# Patient Record
Sex: Male | Born: 1945
Health system: Southern US, Community
[De-identification: ages and names within clinical notes are randomized; demographics above are authoritative.]

## PROBLEM LIST (undated history)

## (undated) ENCOUNTER — Emergency Department (HOSPITAL_COMMUNITY): Admission: EM | Payer: Medicare HMO | Source: Home / Self Care

## (undated) DIAGNOSIS — N189 Chronic kidney disease, unspecified: Secondary | ICD-10-CM

## (undated) DIAGNOSIS — Z87442 Personal history of urinary calculi: Secondary | ICD-10-CM

## (undated) DIAGNOSIS — I472 Ventricular tachycardia: Secondary | ICD-10-CM

## (undated) DIAGNOSIS — E119 Type 2 diabetes mellitus without complications: Secondary | ICD-10-CM

## (undated) DIAGNOSIS — I10 Essential (primary) hypertension: Secondary | ICD-10-CM

## (undated) DIAGNOSIS — I2510 Atherosclerotic heart disease of native coronary artery without angina pectoris: Secondary | ICD-10-CM

## (undated) DIAGNOSIS — Z9289 Personal history of other medical treatment: Secondary | ICD-10-CM

## (undated) DIAGNOSIS — Z95810 Presence of automatic (implantable) cardiac defibrillator: Secondary | ICD-10-CM

## (undated) DIAGNOSIS — G4730 Sleep apnea, unspecified: Secondary | ICD-10-CM

## (undated) DIAGNOSIS — I639 Cerebral infarction, unspecified: Secondary | ICD-10-CM

## (undated) DIAGNOSIS — I4729 Other ventricular tachycardia: Secondary | ICD-10-CM

## (undated) DIAGNOSIS — I219 Acute myocardial infarction, unspecified: Secondary | ICD-10-CM

## (undated) DIAGNOSIS — I5042 Chronic combined systolic (congestive) and diastolic (congestive) heart failure: Secondary | ICD-10-CM

## (undated) DIAGNOSIS — I255 Ischemic cardiomyopathy: Secondary | ICD-10-CM

## (undated) DIAGNOSIS — G839 Paralytic syndrome, unspecified: Secondary | ICD-10-CM

## (undated) DIAGNOSIS — G629 Polyneuropathy, unspecified: Secondary | ICD-10-CM

## (undated) DIAGNOSIS — I509 Heart failure, unspecified: Secondary | ICD-10-CM

## (undated) HISTORY — DX: Ischemic cardiomyopathy: I25.5

## (undated) HISTORY — DX: Personal history of other medical treatment: Z92.89

## (undated) HISTORY — DX: Chronic kidney disease, unspecified: N18.9

## (undated) HISTORY — DX: Ventricular tachycardia: I47.2

## (undated) HISTORY — DX: Atherosclerotic heart disease of native coronary artery without angina pectoris: I25.10

## (undated) HISTORY — PX: CARDIAC DEFIBRILLATOR PLACEMENT: SHX171

## (undated) HISTORY — DX: Chronic combined systolic (congestive) and diastolic (congestive) heart failure: I50.42

## (undated) HISTORY — DX: Other ventricular tachycardia: I47.29

---

## 1968-12-22 HISTORY — PX: LACERATION REPAIR: SHX5168

## 1978-12-23 HISTORY — PX: LACERATION REPAIR: SHX5168

## 1978-12-23 HISTORY — PX: KNEE ARTHROSCOPY: SHX127

## 2005-04-23 DIAGNOSIS — I219 Acute myocardial infarction, unspecified: Secondary | ICD-10-CM

## 2005-04-23 HISTORY — DX: Acute myocardial infarction, unspecified: I21.9

## 2005-04-23 HISTORY — PX: CARDIAC CATHETERIZATION: SHX172

## 2005-08-31 ENCOUNTER — Ambulatory Visit: Payer: Self-pay | Admitting: Cardiology

## 2005-08-31 ENCOUNTER — Encounter: Payer: Self-pay | Admitting: Cardiology

## 2005-08-31 ENCOUNTER — Inpatient Hospital Stay (HOSPITAL_COMMUNITY): Admission: EM | Admit: 2005-08-31 | Discharge: 2005-09-08 | Payer: Self-pay | Admitting: Emergency Medicine

## 2005-09-05 ENCOUNTER — Encounter: Payer: Self-pay | Admitting: Cardiology

## 2005-09-10 ENCOUNTER — Ambulatory Visit: Payer: Self-pay | Admitting: Internal Medicine

## 2005-09-18 ENCOUNTER — Ambulatory Visit: Payer: Self-pay | Admitting: Cardiology

## 2005-09-20 ENCOUNTER — Ambulatory Visit: Payer: Self-pay | Admitting: Cardiology

## 2005-09-24 ENCOUNTER — Ambulatory Visit: Payer: Self-pay | Admitting: Internal Medicine

## 2005-09-26 ENCOUNTER — Ambulatory Visit: Payer: Self-pay

## 2005-09-27 ENCOUNTER — Ambulatory Visit: Payer: Self-pay | Admitting: Internal Medicine

## 2005-10-11 ENCOUNTER — Ambulatory Visit: Payer: Self-pay | Admitting: Internal Medicine

## 2005-10-15 ENCOUNTER — Encounter: Payer: Self-pay | Admitting: Cardiology

## 2005-10-15 ENCOUNTER — Ambulatory Visit: Payer: Self-pay

## 2005-10-19 ENCOUNTER — Ambulatory Visit: Payer: Self-pay | Admitting: Internal Medicine

## 2005-10-22 ENCOUNTER — Ambulatory Visit: Payer: Self-pay | Admitting: Cardiology

## 2005-11-08 ENCOUNTER — Ambulatory Visit: Payer: Self-pay | Admitting: Cardiology

## 2005-11-16 ENCOUNTER — Ambulatory Visit: Payer: Self-pay | Admitting: Internal Medicine

## 2005-12-04 ENCOUNTER — Ambulatory Visit: Payer: Self-pay | Admitting: Internal Medicine

## 2005-12-06 ENCOUNTER — Ambulatory Visit (HOSPITAL_BASED_OUTPATIENT_CLINIC_OR_DEPARTMENT_OTHER): Admission: RE | Admit: 2005-12-06 | Discharge: 2005-12-06 | Payer: Self-pay | Admitting: Internal Medicine

## 2005-12-12 ENCOUNTER — Ambulatory Visit: Payer: Self-pay | Admitting: Pulmonary Disease

## 2005-12-21 ENCOUNTER — Ambulatory Visit: Payer: Self-pay | Admitting: Cardiology

## 2006-01-16 ENCOUNTER — Ambulatory Visit: Payer: Self-pay | Admitting: Emergency Medicine

## 2006-01-18 ENCOUNTER — Ambulatory Visit: Payer: Self-pay | Admitting: Internal Medicine

## 2006-01-21 ENCOUNTER — Ambulatory Visit: Payer: Self-pay | Admitting: Internal Medicine

## 2006-01-22 ENCOUNTER — Ambulatory Visit: Payer: Self-pay | Admitting: Internal Medicine

## 2006-01-25 ENCOUNTER — Ambulatory Visit: Payer: Self-pay | Admitting: Internal Medicine

## 2006-02-21 ENCOUNTER — Ambulatory Visit: Payer: Self-pay | Admitting: Internal Medicine

## 2006-03-04 ENCOUNTER — Ambulatory Visit: Payer: Self-pay | Admitting: Emergency Medicine

## 2006-03-04 ENCOUNTER — Ambulatory Visit: Payer: Self-pay | Admitting: Internal Medicine

## 2006-03-13 ENCOUNTER — Ambulatory Visit: Payer: Self-pay | Admitting: Cardiology

## 2006-05-03 ENCOUNTER — Ambulatory Visit: Payer: Self-pay | Admitting: Internal Medicine

## 2006-05-09 ENCOUNTER — Ambulatory Visit: Payer: Self-pay

## 2006-05-09 ENCOUNTER — Encounter: Payer: Self-pay | Admitting: Cardiology

## 2006-05-20 ENCOUNTER — Ambulatory Visit: Payer: Self-pay | Admitting: Internal Medicine

## 2006-06-03 ENCOUNTER — Ambulatory Visit: Payer: Self-pay | Admitting: Internal Medicine

## 2006-06-21 ENCOUNTER — Ambulatory Visit: Payer: Self-pay | Admitting: Internal Medicine

## 2006-06-21 LAB — CONVERTED CEMR LAB
BUN: 19 mg/dL (ref 6–23)
CO2: 29 meq/L (ref 19–32)
Calcium: 8.8 mg/dL (ref 8.4–10.5)
Cholesterol: 128 mg/dL (ref 0–200)
Creatinine,U: 26.4 mg/dL
GFR calc Af Amer: 80 mL/min
GFR calc non Af Amer: 66 mL/min
Glucose, Bld: 214 mg/dL — ABNORMAL HIGH (ref 70–99)
HDL: 32.7 mg/dL — ABNORMAL LOW (ref >39.0)
LDL Cholesterol: 56 mg/dL (ref 0–99)
Microalb Creat Ratio: 7.6 mg/g (ref 0.0–30.0)
Microalb, Ur: 0.2 mg/dL (ref 0.0–1.9)
Potassium: 3.8 meq/L (ref 3.5–5.1)
Pro B Natriuretic peptide (BNP): 62 pg/mL (ref 0.0–100.0)
Sodium: 138 meq/L (ref 135–145)
Total CHOL/HDL Ratio: 3.9
Triglycerides: 199 mg/dL — ABNORMAL HIGH (ref 0–149)

## 2006-07-11 ENCOUNTER — Ambulatory Visit: Payer: Self-pay | Admitting: Cardiology

## 2006-07-16 ENCOUNTER — Ambulatory Visit: Payer: Self-pay | Admitting: Internal Medicine

## 2006-08-05 ENCOUNTER — Ambulatory Visit: Payer: Self-pay | Admitting: Internal Medicine

## 2006-09-02 ENCOUNTER — Ambulatory Visit: Payer: Self-pay | Admitting: Cardiology

## 2006-09-02 ENCOUNTER — Ambulatory Visit: Payer: Self-pay | Admitting: Internal Medicine

## 2006-09-02 LAB — CONVERTED CEMR LAB
CO2: 29 meq/L (ref 19–32)
Calcium: 8.9 mg/dL (ref 8.4–10.5)

## 2006-09-12 ENCOUNTER — Ambulatory Visit: Payer: Self-pay | Admitting: Internal Medicine

## 2006-09-30 ENCOUNTER — Ambulatory Visit: Payer: Self-pay | Admitting: Internal Medicine

## 2006-11-19 ENCOUNTER — Ambulatory Visit: Payer: Self-pay | Admitting: Internal Medicine

## 2006-11-27 ENCOUNTER — Ambulatory Visit: Payer: Self-pay | Admitting: Internal Medicine

## 2006-11-27 LAB — CONVERTED CEMR LAB
Creatinine, Ser: 1.1 mg/dL (ref 0.4–1.5)
Creatinine,U: 140.1 mg/dL
GFR calc Af Amer: 88 mL/min
GFR calc non Af Amer: 73 mL/min
Glucose, Bld: 166 mg/dL — ABNORMAL HIGH (ref 70–99)
Hgb A1c MFr Bld: 7.7 % — ABNORMAL HIGH (ref 4.6–6.0)
Microalb Creat Ratio: 5 mg/g (ref 0.0–30.0)
Microalb, Ur: 0.7 mg/dL (ref 0.0–1.9)
Potassium: 3.7 meq/L (ref 3.5–5.1)
Sodium: 141 meq/L (ref 135–145)
Total CHOL/HDL Ratio: 3.5
VLDL: 24 mg/dL (ref 0–40)

## 2006-12-02 ENCOUNTER — Ambulatory Visit: Payer: Self-pay | Admitting: Internal Medicine

## 2006-12-10 ENCOUNTER — Ambulatory Visit: Payer: Self-pay | Admitting: Internal Medicine

## 2006-12-12 ENCOUNTER — Ambulatory Visit: Payer: Self-pay | Admitting: Internal Medicine

## 2007-01-20 ENCOUNTER — Ambulatory Visit: Payer: Self-pay | Admitting: Cardiology

## 2007-02-07 ENCOUNTER — Encounter: Payer: Self-pay | Admitting: Cardiology

## 2007-02-07 ENCOUNTER — Ambulatory Visit: Payer: Self-pay

## 2007-02-11 ENCOUNTER — Ambulatory Visit: Payer: Self-pay | Admitting: Pediatrics

## 2007-02-27 ENCOUNTER — Encounter: Payer: Self-pay | Admitting: Internal Medicine

## 2007-02-27 DIAGNOSIS — Z87442 Personal history of urinary calculi: Secondary | ICD-10-CM | POA: Insufficient documentation

## 2007-02-27 DIAGNOSIS — I130 Hypertensive heart and chronic kidney disease with heart failure and stage 1 through stage 4 chronic kidney disease, or unspecified chronic kidney disease: Secondary | ICD-10-CM

## 2007-02-27 DIAGNOSIS — G4733 Obstructive sleep apnea (adult) (pediatric): Secondary | ICD-10-CM

## 2007-02-27 DIAGNOSIS — I252 Old myocardial infarction: Secondary | ICD-10-CM

## 2007-03-03 ENCOUNTER — Ambulatory Visit: Payer: Self-pay | Admitting: Internal Medicine

## 2007-04-11 ENCOUNTER — Ambulatory Visit: Payer: Self-pay | Admitting: Internal Medicine

## 2007-04-11 DIAGNOSIS — I255 Ischemic cardiomyopathy: Secondary | ICD-10-CM

## 2007-04-11 DIAGNOSIS — E782 Mixed hyperlipidemia: Secondary | ICD-10-CM | POA: Insufficient documentation

## 2007-04-22 ENCOUNTER — Ambulatory Visit: Payer: Self-pay | Admitting: Cardiology

## 2007-04-22 LAB — CONVERTED CEMR LAB
BUN: 10 mg/dL (ref 6–23)
CO2: 30 meq/L (ref 19–32)
Calcium: 8.4 mg/dL (ref 8.4–10.5)
Creatinine, Ser: 1.1 mg/dL (ref 0.4–1.5)
GFR calc Af Amer: 88 mL/min
GFR calc non Af Amer: 73 mL/min

## 2007-06-13 ENCOUNTER — Ambulatory Visit: Payer: Self-pay | Admitting: Cardiology

## 2007-08-08 ENCOUNTER — Ambulatory Visit: Payer: Self-pay | Admitting: Internal Medicine

## 2007-08-09 LAB — CONVERTED CEMR LAB
ALT: 25 units/L (ref 0–53)
AST: 22 units/L (ref 0–37)
Albumin: 3.5 g/dL (ref 3.5–5.2)
BUN: 13 mg/dL (ref 6–23)
CO2: 30 meq/L (ref 19–32)
Calcium: 8.8 mg/dL (ref 8.4–10.5)
Chloride: 105 meq/L (ref 96–112)
Direct LDL: 51.7 mg/dL
GFR calc Af Amer: 98 mL/min
Potassium: 3.3 meq/L — ABNORMAL LOW (ref 3.5–5.1)
Total Protein: 6.5 g/dL (ref 6.0–8.3)
Triglycerides: 263 mg/dL (ref 0–149)
VLDL: 53 mg/dL — ABNORMAL HIGH (ref 0–40)

## 2007-08-12 ENCOUNTER — Ambulatory Visit: Payer: Self-pay | Admitting: Internal Medicine

## 2007-08-12 DIAGNOSIS — E1149 Type 2 diabetes mellitus with other diabetic neurological complication: Secondary | ICD-10-CM

## 2007-08-12 DIAGNOSIS — E1165 Type 2 diabetes mellitus with hyperglycemia: Secondary | ICD-10-CM

## 2007-09-23 ENCOUNTER — Ambulatory Visit: Payer: Self-pay | Admitting: Internal Medicine

## 2007-10-07 ENCOUNTER — Ambulatory Visit: Payer: Self-pay | Admitting: Internal Medicine

## 2007-10-07 DIAGNOSIS — M109 Gout, unspecified: Secondary | ICD-10-CM

## 2007-11-14 ENCOUNTER — Ambulatory Visit: Payer: Self-pay | Admitting: Internal Medicine

## 2007-11-14 LAB — CONVERTED CEMR LAB
CO2: 28 meq/L (ref 19–32)
Calcium: 8.8 mg/dL (ref 8.4–10.5)
Chloride: 104 meq/L (ref 96–112)
Creatinine, Ser: 1 mg/dL (ref 0.4–1.5)
GFR calc Af Amer: 98 mL/min
Hgb A1c MFr Bld: 8.1 % — ABNORMAL HIGH (ref 4.6–6.0)
Sodium: 140 meq/L (ref 135–145)

## 2007-11-18 ENCOUNTER — Ambulatory Visit: Payer: Self-pay | Admitting: Internal Medicine

## 2007-11-28 ENCOUNTER — Ambulatory Visit: Payer: Self-pay | Admitting: Cardiology

## 2008-01-19 ENCOUNTER — Ambulatory Visit: Payer: Self-pay | Admitting: Internal Medicine

## 2008-02-13 ENCOUNTER — Ambulatory Visit: Payer: Self-pay | Admitting: Internal Medicine

## 2008-02-13 LAB — CONVERTED CEMR LAB: Microalb, Ur: 1.1 mg/dL (ref 0.0–1.9)

## 2008-02-19 ENCOUNTER — Ambulatory Visit: Payer: Self-pay | Admitting: Internal Medicine

## 2008-04-19 ENCOUNTER — Ambulatory Visit: Payer: Self-pay | Admitting: Internal Medicine

## 2008-05-27 ENCOUNTER — Encounter: Payer: Self-pay | Admitting: Internal Medicine

## 2008-06-08 ENCOUNTER — Ambulatory Visit: Payer: Self-pay | Admitting: Cardiology

## 2008-06-11 ENCOUNTER — Ambulatory Visit: Payer: Self-pay | Admitting: Internal Medicine

## 2008-06-11 LAB — CONVERTED CEMR LAB
BUN: 21 mg/dL (ref 6–23)
CO2: 28 meq/L (ref 19–32)
Calcium: 8.9 mg/dL (ref 8.4–10.5)
Chloride: 109 meq/L (ref 96–112)
Creatinine, Ser: 1.2 mg/dL (ref 0.4–1.5)
Creatinine,U: 191.9 mg/dL
GFR calc non Af Amer: 65 mL/min
Glucose, Bld: 182 mg/dL — ABNORMAL HIGH (ref 70–99)
Sodium: 144 meq/L (ref 135–145)

## 2008-06-15 ENCOUNTER — Ambulatory Visit: Payer: Self-pay | Admitting: Internal Medicine

## 2008-07-02 ENCOUNTER — Ambulatory Visit: Payer: Self-pay | Admitting: Gastroenterology

## 2008-07-19 ENCOUNTER — Ambulatory Visit: Payer: Self-pay | Admitting: Internal Medicine

## 2008-08-16 ENCOUNTER — Telehealth (INDEPENDENT_AMBULATORY_CARE_PROVIDER_SITE_OTHER): Payer: Self-pay | Admitting: *Deleted

## 2008-08-20 ENCOUNTER — Ambulatory Visit: Payer: Self-pay | Admitting: Gastroenterology

## 2008-08-20 ENCOUNTER — Encounter: Payer: Self-pay | Admitting: Gastroenterology

## 2008-08-23 ENCOUNTER — Encounter: Payer: Self-pay | Admitting: Gastroenterology

## 2008-09-28 ENCOUNTER — Ambulatory Visit: Payer: Self-pay | Admitting: Internal Medicine

## 2008-10-07 ENCOUNTER — Ambulatory Visit: Payer: Self-pay | Admitting: Internal Medicine

## 2008-10-07 LAB — CONVERTED CEMR LAB
ALT: 21 units/L (ref 0–53)
AST: 21 units/L (ref 0–37)
Albumin: 3.7 g/dL (ref 3.5–5.2)
Bilirubin, Direct: 0.2 mg/dL (ref 0.0–0.3)
CO2: 28 meq/L (ref 19–32)
Chloride: 108 meq/L (ref 96–112)
GFR calc non Af Amer: 65.15 mL/min (ref >60)
HDL: 30.9 mg/dL — ABNORMAL LOW (ref >39.00)
Total Bilirubin: 0.9 mg/dL (ref 0.3–1.2)
Total Protein: 6.9 g/dL (ref 6.0–8.3)

## 2008-10-12 ENCOUNTER — Ambulatory Visit: Payer: Self-pay | Admitting: Internal Medicine

## 2008-10-12 DIAGNOSIS — IMO0001 Reserved for inherently not codable concepts without codable children: Secondary | ICD-10-CM

## 2008-10-12 LAB — CONVERTED CEMR LAB
Total CK: 147 units/L (ref 7–232)
Uric Acid, Serum: 6.8 mg/dL (ref 4.0–7.8)
Vit D, 1,25-Dihydroxy: 25 — ABNORMAL LOW (ref 30–89)

## 2008-10-14 ENCOUNTER — Encounter: Payer: Self-pay | Admitting: Internal Medicine

## 2008-12-27 ENCOUNTER — Ambulatory Visit: Payer: Self-pay | Admitting: Internal Medicine

## 2009-01-13 ENCOUNTER — Encounter: Payer: Self-pay | Admitting: Internal Medicine

## 2009-01-25 ENCOUNTER — Telehealth: Payer: Self-pay | Admitting: Internal Medicine

## 2009-01-25 ENCOUNTER — Ambulatory Visit: Payer: Self-pay | Admitting: Internal Medicine

## 2009-01-25 LAB — CONVERTED CEMR LAB
BUN: 18 mg/dL (ref 6–23)
Chloride: 106 meq/L (ref 96–112)
Creatinine, Ser: 1.22 mg/dL (ref 0.40–1.50)
Hgb A1c MFr Bld: 8.2 % — ABNORMAL HIGH (ref 4.6–6.1)
Potassium: 4.3 meq/L (ref 3.5–5.3)

## 2009-01-28 ENCOUNTER — Ambulatory Visit: Payer: Self-pay | Admitting: Internal Medicine

## 2009-03-10 ENCOUNTER — Encounter (INDEPENDENT_AMBULATORY_CARE_PROVIDER_SITE_OTHER): Payer: Self-pay | Admitting: *Deleted

## 2009-04-07 ENCOUNTER — Encounter: Payer: Self-pay | Admitting: Internal Medicine

## 2009-04-18 ENCOUNTER — Telehealth: Payer: Self-pay | Admitting: Internal Medicine

## 2009-04-18 ENCOUNTER — Encounter: Payer: Self-pay | Admitting: Internal Medicine

## 2009-04-26 ENCOUNTER — Ambulatory Visit: Payer: Self-pay | Admitting: Internal Medicine

## 2009-04-26 ENCOUNTER — Telehealth: Payer: Self-pay | Admitting: Internal Medicine

## 2009-05-05 ENCOUNTER — Encounter: Payer: Self-pay | Admitting: Internal Medicine

## 2009-05-23 ENCOUNTER — Ambulatory Visit: Payer: Self-pay | Admitting: Internal Medicine

## 2009-05-23 ENCOUNTER — Telehealth: Payer: Self-pay | Admitting: Internal Medicine

## 2009-05-23 ENCOUNTER — Encounter: Payer: Self-pay | Admitting: Internal Medicine

## 2009-05-23 LAB — CONVERTED CEMR LAB
Chloride: 109 meq/L (ref 96–112)
Creatinine, Ser: 1.2 mg/dL (ref 0.4–1.5)
GFR calc non Af Amer: 65.02 mL/min (ref >60)
Glucose, Bld: 213 mg/dL — ABNORMAL HIGH (ref 70–99)
Potassium: 4.1 meq/L (ref 3.5–5.1)
Sodium: 142 meq/L (ref 135–145)

## 2009-07-25 ENCOUNTER — Ambulatory Visit: Payer: Self-pay | Admitting: Internal Medicine

## 2009-08-10 ENCOUNTER — Encounter: Payer: Self-pay | Admitting: Internal Medicine

## 2009-10-11 ENCOUNTER — Ambulatory Visit: Payer: Self-pay | Admitting: Internal Medicine

## 2009-10-11 DIAGNOSIS — I5042 Chronic combined systolic (congestive) and diastolic (congestive) heart failure: Secondary | ICD-10-CM | POA: Insufficient documentation

## 2009-10-11 LAB — CONVERTED CEMR LAB
CO2: 23 meq/L (ref 19–32)
Chloride: 107 meq/L (ref 96–112)
Creatinine, Urine: 443.8 mg/dL
Glucose, Bld: 92 mg/dL (ref 70–99)
Microalb, Ur: 3.61 mg/dL — ABNORMAL HIGH (ref 0.00–1.89)
Sodium: 143 meq/L (ref 135–145)

## 2009-10-12 ENCOUNTER — Telehealth: Payer: Self-pay | Admitting: Internal Medicine

## 2009-12-02 ENCOUNTER — Ambulatory Visit: Payer: Self-pay | Admitting: Cardiology

## 2009-12-21 ENCOUNTER — Ambulatory Visit: Payer: Self-pay | Admitting: Cardiology

## 2009-12-21 ENCOUNTER — Ambulatory Visit: Payer: Self-pay | Admitting: Cardiovascular Disease

## 2009-12-21 ENCOUNTER — Encounter: Payer: Self-pay | Admitting: Cardiology

## 2009-12-21 ENCOUNTER — Ambulatory Visit (HOSPITAL_COMMUNITY): Admission: RE | Admit: 2009-12-21 | Discharge: 2009-12-21 | Payer: Self-pay | Admitting: Cardiology

## 2009-12-21 ENCOUNTER — Ambulatory Visit: Payer: Self-pay

## 2010-01-04 LAB — CONVERTED CEMR LAB
AST: 14 units/L (ref 0–37)
Alkaline Phosphatase: 125 units/L — ABNORMAL HIGH (ref 39–117)
Total Protein: 6.6 g/dL (ref 6.0–8.3)
Triglycerides: 153 mg/dL — ABNORMAL HIGH (ref 0.0–149.0)
VLDL: 30.6 mg/dL (ref 0.0–40.0)

## 2010-01-05 ENCOUNTER — Encounter: Payer: Self-pay | Admitting: Cardiology

## 2010-01-12 ENCOUNTER — Ambulatory Visit: Payer: Self-pay | Admitting: Internal Medicine

## 2010-02-01 ENCOUNTER — Encounter: Payer: Self-pay | Admitting: Internal Medicine

## 2010-04-20 ENCOUNTER — Ambulatory Visit: Payer: Self-pay | Admitting: Internal Medicine

## 2010-05-21 ENCOUNTER — Encounter (INDEPENDENT_AMBULATORY_CARE_PROVIDER_SITE_OTHER): Payer: Self-pay | Admitting: *Deleted

## 2010-05-23 NOTE — Assessment & Plan Note (Signed)
Summary: [name] [name]      Allergies Added: NKDA  Visit Type:  Follow-up Primary Provider:  Jennings Books DO  CC:  CAD.  History of Present Illness: The patient presents for followup of her recent cardiomyopathy. Since I last saw him he has had no new cardiovascular complaints. He is working full time. The most vigorous thing he does his works in his yard to include pushing a Therapist, art. With this he does not describe chest, neck or arm discomfort. He does not get palpitations, presyncope or syncope. He denies any shortness of breath, PND or orthopnea. He has had no weight gain or edema. Unfortunately I note that his hemoglobin A1c was 10.9. He is apparently being referred to an endocrinologist.  Current Medications (verified): 1)  Aspirin Ec 325 Mg Tbec (Aspirin) .... Take 1 Tablet By Mouth Once A Day 2)  Coreg 25 Mg Tabs (Carvedilol) .... Take 1 Tablet By Mouth Twice A Day 3)  Enalapril Maleate 10 Mg  Tabs (Enalapril Maleate) .... Two Times A Day 4)  Lantus Solostar 100 Unit/ml Soln (Insulin Glargine) .... Inject 35 Units To 50 Units Subcutaneously At Bedtime 5)  Lasix 40 Mg Tabs (Furosemide) .... Take 1 Tablet By Mouth Every Morning 6)  Lipitor 80 Mg Tabs (Atorvastatin Calcium) .... Take 1 Tablet By Mouth At Bedtime 7)  Metformin Hcl 1000 Mg  Tabs (Metformin Hcl) .... One Po Two Times A Day 8)  Protonix 40 Mg Tbec (Pantoprazole Sodium) .... Take 1 Tablet By Mouth Once A Day 9)  Novolog Flexpen 100 Unit/ml  Soln (Insulin Aspart) .... Inject 12 Units Subcutaneously Before Dinner  and Inject 12 Units Subcutaneously Before Lunch 10)  Freestyle Lancets   Misc (Lancets) .... Use One Lancet Three Times Daily 11)  Freestyle Test   Strp (Glucose Blood) .... Test Blood Sugar Three Times A Day 12)  Fish Oil Concentrate 1000 Mg  Caps (Omega-3 Fatty Acids) .... 2 Caps By Mouth Bid 13)  Bd U/f Short Pen Needle 31g X 8 Mm  Misc (Insulin Pen Needle) .... Use For Insulin Injections Three Times A Day 14)   Amlodipine Besylate 2.5 Mg Tabs (Amlodipine Besylate) .... One By Mouth Qd 15)  Vitamin D 2000 Unit Caps (Cholecalciferol) .... Take 1 Capsule By Mouth Once A Day 16)  Colcrys 0.6 Mg Tabs (Colchicine) .... One By Mouth Once Daily  Allergies (verified): No Known Drug Allergies  Past History:  Past Medical History: Reviewed history from 10/11/2009 and no changes required. Ischemic cardiomyopathy  Coronary artery disease (triple vessel) History of nonsustained ventricular tachycardia status post implantation of ICD Cardiomyopathy improved from 20 % to  45% Diabetes Mellitus Type II  Hypertension Obstructive sleep apnea         Dr. Creig Hines  Past Surgical History: Reviewed history from 05/23/2009 and no changes required. Left forearm signs  ICD - Medtronic Atlas cardioverter defibrillator/pacemaker (may 2007)    Review of Systems       As stated in the HPI and negative for all other systems.   Vital Signs:  Patient profile:   65 year old male Height:      73 inches Weight:      213 pounds BMI:     28.20 Pulse rate:   66 / minute Resp:     16 per minute BP sitting:   108 / 76  (right arm)  Vitals Entered By: Levora Angel, CNA (December 02, 2009 3:20 PM)  Physical Exam  General:  Well  developed, well nourished, in no acute distress. Head:  normocephalic and atraumatic Eyes:  PERRLA/EOM intact; conjunctiva and lids normal. Mouth:  Poor dentition. Oral mucosa normal. Neck:  Neck supple, no JVD. No masses, thyromegaly or abnormal cervical nodes. Lungs:  Clear bilaterally to auscultation and percussion. Abdomen:  Bowel sounds positive; abdomen soft and non-tender without masses, organomegaly, or hernias noted. No hepatosplenomegaly. Msk:  Back normal, normal gait. Muscle strength and tone normal. Extremities:  No clubbing or cyanosis. Neurologic:  Alert and oriented x 3. Skin:  Intact without lesions or rashes. Cervical Nodes:  no significant adenopathy Axillary Nodes:   no significant adenopathy Inguinal Nodes:  no significant adenopathy Psych:  Normal affect.   Detailed Cardiovascular Exam  Neck    Carotids: Carotids full and equal bilaterally without bruits.      Neck Veins: Normal, no JVD.    Heart    Inspection: no deformities or lifts noted.      Palpation: normal PMI with no thrills palpable.      Auscultation: regular rate and rhythm, S1, S2 soft apical systolic murmur, no diastolic murmurs  Vascular    Abdominal Aorta: no palpable masses, pulsations, or audible bruits.      Femoral Pulses: normal femoral pulses bilaterally.      Pedal Pulses: normal pedal pulses bilaterally.      Radial Pulses: normal radial pulses bilaterally.      Peripheral Circulation: no clubbing, cyanosis, or edema noted with normal capillary refill.      ICD Specifications Following MD:  Virl Axe, MD     ICD Vendor:  [facility]     ICD Model Number:  [id number]     ICD Serial Number:  [id number] ICD DOI:  09/07/2005     ICD Implanting MD:  Virl Axe, MD  Lead 1:    Location: RV     DOI: 09/07/2005     Model #: [id number]     Serial #: [id number]     Status: active  Indications::  ICM   ICD Follow Up ICD Dependent:  No      Brady Parameters Mode VVI     Lower Rate Limit:  40      Tachy Zones VF:  240     VT:  200     VT1:  171     Impression & Recommendations:  Problem # 1:  CAD (ICD-414.00) The patient has no new symptoms. He needs aggressive risk reduction. He doesn't need further testing at this point without new symptoms.  Problem # 2:  COMBINED HEART FAILURE, CHRONIC (ICD-428.42) He seems to be euvolemic. However, he has not had an ejection fraction assessed since 2008. I will order an echocardiogram. For now he will continue the meds as listed. Orders: Echocardiogram (Echo)  Problem # 3:  HYPERLIPIDEMIA (B2193296.2) He needs a fasting lipid profile. It has been over one year. The goal will be an LDL less than 70 and HDL greater than 40.  Other Orders: EKG  w/ Interpretation (93000)  Patient Instructions: 1)  Your physician recommends that you schedule a follow-up appointment in: 1 yr with Dr Percival Spanish 2)  Your physician recommends that you return for a FASTING lipid and profile: 272.00 v58.69 3)  Your physician recommends that you continue on your current medications as directed. Please refer to the Current Medication list given to you today. 4)  Your physician has requested that you have an echocardiogram.  Echocardiography is a painless test that uses sound  waves to create images of your heart. It provides your doctor with information about the size and shape of your heart and how well your heart's chambers and valves are working.  This procedure takes approximately one hour. There are no restrictions for this procedure.

## 2010-05-23 NOTE — Assessment & Plan Note (Signed)
Summary: 3 MONTH FOLLOW UP/MHF   Vital Signs:  Patient profile:   65 year old male Weight:      216.50 pounds BMI:     28.67 O2 Sat:      98 % on Room air Temp:     97.8 degrees F oral Pulse rate:   68 / minute Pulse rhythm:   regular Resp:     18 per minute BP sitting:   116 / 72  (left arm) Cuff size:   large  Vitals Entered By: Jiles Garter CMA (May 23, 2009 9:06 AM)  O2 Flow:  Room air  Primary Care Provider:  D. Drema Pry DO  CC:  Right foot pain.  History of Present Illness: Follow up disease management & right Foot Pain   65 y/o white male with hx of CAD, Htn, and gout reports acute right foot pain that started saturday night.  his prev gout flares have affected right foot.   it has been a while since his last gout flare.  he does not drink.  he denies dehydrating illness.  more red meat than usual last week.  htn - stable  DM II   - low blood sugar 82, high 176 avg 125-135,  this am 140  Preventive Screening-Counseling & Management  Alcohol-Tobacco     Smoking Status: never  Allergies (verified): No Known Drug Allergies  Past History:  Past Medical History: Ischemic cardiomyopathy  Coronary artery disease (triple vessel) History of nonsustained ventricular tachycardia status post implantation of ICD Cardiomyopathy improved from 20 % to  45% Diabetes Mellitus Type II  Hypertension Obstructive sleep apnea         Past Surgical History: Left forearm signs  ICD - Medtronic Atlas cardioverter defibrillator/pacemaker (may 2007)    Family History: Family history of CAD - mother no colon cancer      Social History: Occupation:  Back to work for [employer] Married with 2 daughers Never Smoked   Alcohol use-no         Physical Exam  General:  alert, well-developed, and well-nourished.   Neck:  supple and no masses.  no carotid bruits.   Lungs:  normal respiratory effort and normal breath sounds.   Heart:  normal rate, regular rhythm,  and no gallop.   Msk:  right foot - 1 st metatarsal red and inflammed.  tenderness   Impression & Recommendations:  Problem # 1:  GOUT (ICD-274.9) Pt with gout flare of right great toe.  he has infreq gout flares.  he does reports more red meat consumption last week.  check uric acid level.  use colchicine as needed.  His updated medication list for this problem includes:    Colchicine 0.6 Mg Tabs (Colchicine) ..... One by mouth once daily  Problem # 2:  HYPERTENSION (ICD-401.9) Assessment: Unchanged well controlled.  consider change enalapril to losartan due to hx of gout. His updated medication list for this problem includes:    Coreg 25 Mg Tabs (Carvedilol) .Marland Kitchen... Take 1 tablet by mouth twice a day    Enalapril Maleate 10 Mg Tabs (Enalapril maleate) .Marland Kitchen..Marland Kitchen Two times a day    Lasix 40 Mg Tabs (Furosemide) .Marland Kitchen... Take 1 tablet by mouth every morning    Amlodipine Besylate 2.5 Mg Tabs (Amlodipine besylate) ..... One by mouth qd  BP today: 116/72 Prior BP: 120/90 (01/28/2009)  Labs Reviewed: K+: 4.3 (01/25/2009) Creat: : 1.22 (01/25/2009)   Chol: 100 (10/07/2008)   HDL: 30.90 (10/07/2008)  LDL: 50 (10/07/2008)   TG: 94.0 (10/07/2008)  Complete Medication List: 1)  Aspirin Ec 325 Mg Tbec (Aspirin) .... Take 1 tablet by mouth once a day 2)  Coreg 25 Mg Tabs (Carvedilol) .... Take 1 tablet by mouth twice a day 3)  Enalapril Maleate 10 Mg Tabs (Enalapril maleate) .... Two times a day 4)  Lantus Solostar 100 Unit/ml Soln (Insulin glargine) .... Inject 35 units to 50 units subcutaneously at bedtime 5)  Lasix 40 Mg Tabs (Furosemide) .... Take 1 tablet by mouth every morning 6)  Lipitor 80 Mg Tabs (Atorvastatin calcium) .... Take 1 tablet by mouth at bedtime 7)  Metformin Hcl 1000 Mg Tabs (Metformin hcl) .... One po two times a day 8)  Protonix 40 Mg Tbec (Pantoprazole sodium) .... Take 1 tablet by mouth once a day 9)  Novolog Flexpen 100 Unit/ml Soln (Insulin aspart) .... Inject 12 units  subcutaneously before dinner  and inject 12 units subcutaneously before lunch 10)  Freestyle Lancets Misc (Lancets) .... Use one lancet three times daily 11)  Freestyle Test Strp (Glucose blood) .... Test blood sugar three times a day 12)  Fish Oil Concentrate 1000 Mg Caps (Omega-3 fatty acids) .... 2 caps by mouth bid 13)  Bd U/f Short Pen Needle 31g X 8 Mm Misc (Insulin pen needle) .... Use for insulin injections three times a day 14)  Amlodipine Besylate 2.5 Mg Tabs (Amlodipine besylate) .... One by mouth qd 15)  Vitamin D 2000 Unit Caps (Cholecalciferol) .... Take 1 capsule by mouth once a day 16)  Colchicine 0.6 Mg Tabs (Colchicine) .... One by mouth once daily Prescriptions: COLCHICINE 0.6 MG TABS (COLCHICINE) one by mouth once daily  #30 x 1   Entered and Authorized by:   D. Drema Pry DO   Signed by:   D. Drema Pry DO on 05/23/2009   Method used:   Electronically to        [facility]  [phone]* (retail)       [address]       [place], [place]  [address]       Ph: [phone] or [phone]       Fax: [phone]   RxID:   [id number]     Current Allergies (reviewed today): No known allergies

## 2010-05-23 NOTE — Progress Notes (Signed)
Summary: Lab Results  Phone Note Outgoing Call   Summary of Call: call pt - diabetes is worse.  A1c is 10.9.  I suggest referral to endocrinologist - Dr. Suzette Battiest Initial call taken by: D. Drema Pry DO,  October 12, 2009 8:58 AM  Follow-up for Phone Call        attempted to contact patient at [phone], patient wife [name] [name] stated patient was at work, she was advised per patient request, per Dr Shawna Orleans instructions. She was informed patientwould receive a call from referral coordinator with appointment date and time Follow-up by: Jiles Garter CMA,  October 12, 2009 9:05 AM

## 2010-05-23 NOTE — Letter (Signed)
Summary: Remote Device Check  [place], [place]  [id number] [place]   [place], [place] [address]   Phone: [phone]  Fax: [phone]     August 10, 2009 MRN: [id number]   [facility] [place], [place]  [address]   Dear Mr. Marlowe,   Your remote transmission was recieved and reviewed by your physician.  All diagnostics were within normal limits for you.    ___X___Your next office visit is scheduled for:  JUNE 2011 [place]. Please call our office to schedule an appointment.    Sincerely,  Hotel manager

## 2010-05-23 NOTE — Cardiovascular Report (Signed)
Summary: Office Visit Remote   Office Visit Remote   Imported By: Sallee Provencal 05/05/2009 11:22:22  _____________________________________________________________________  External Attachment:    Type:   Image     Comment:   External Document

## 2010-05-23 NOTE — Letter (Signed)
Summary: Remote Device Check  [facility], [place]  [id number] [place]   [place], [place] [address]   Phone: [phone]  Fax: [phone]     February 01, 2010 MRN: [id number]   [facility] [place], [place]  [address]   Dear Mr. Obey,   Your remote transmission was recieved and reviewed by your physician.  All diagnostics were within normal limits for you.  __X___Your next transmission is scheduled for: 04-20-2010.  Please transmit at any time this day.  If you have a wireless device your transmission will be sent automatically.      Sincerely,  Shelly Bombard

## 2010-05-23 NOTE — Progress Notes (Signed)
Summary: CALL A NURSE TRIAGE REPORT  Phone Note Other Incoming   Caller: CALL A NURSE TRIAGE CALL REPORT Summary of Call: Pt states has a hx of cout and belives he is experiencing a flare up for which he usually takes colchicine.  He reports pain in his rt foot in the big toe and redness noted on the right inner side of his foot where it is red.  He states these symptoms are exactly similar to previous flare ups of gout.  RN paged Dr Sherren Mocha @ 9:4- am and no answer.  RN phoned MD via cell @ 50 AM and no answer left message on vm.  RN phoned MD at home @ 955 am and RN relayed to him pt's symptoms/request .  Per MD's orders, they do not call in medication after hours and weekends and he may either follow up with PCP tomorrow or seek care at [facility] ER.  RN relayed information to pt.  Pt states will f/u tomorrow w PCP  Initial call taken by: Shanon Payor,  May 23, 2009 8:11 AM

## 2010-05-23 NOTE — Progress Notes (Signed)
Summary: Medication Change  Phone Note From Pharmacy   Caller: [place]  [phone]* Summary of Call: pharmacist sent a fax stating Colchine generic is no longer being made onlybrand name Colerys which is expensive. Pharmacist would like to know if there is something to Dr Presley Raddle ike to prescribe Initial call taken by: Jiles Garter CMA,  May 23, 2009 12:09 PM  Follow-up for Phone Call        change to indomethacin  -  see rx.   Follow-up by: D. Drema Pry DO,  May 23, 2009 12:28 PM  Additional Follow-up for Phone Call Additional follow up Details #1::        call placed to patient to inform him of medication change, he states that he has picked up the rx for the Colchicine from the pharmacy when he left the office.   Call placed to pharamcist and he states they filled the rx with the replacment for colchicine which was colcheryl. Rx for Indomethacin cancelled. Additional Follow-up by: Jiles Garter CMA,  May 23, 2009 1:14 PM    Additional Follow-up for Phone Call Additional follow up Details #2::    noted Follow-up by: D. Drema Pry DO,  May 23, 2009 1:38 PM  New/Updated Medications: INDOMETHACIN 25 MG CAPS (INDOMETHACIN) one by mouth two times a day as needed COLCRYS 0.6 MG TABS (COLCHICINE) one by mouth once daily Prescriptions: COLCRYS 0.6 MG TABS (COLCHICINE) one by mouth once daily  #30 x 1   Entered and Authorized by:   D. Drema Pry DO   Signed by:   D. Drema Pry DO on 05/23/2009   Method used:   Electronically to        [facility]  [phone]* (retail)       [address]       [place], [place]  [address]       Ph: [phone] or [phone]       Fax: [phone]   RxID:   [id number] INDOMETHACIN 25 MG CAPS (INDOMETHACIN) one by mouth two times a day as needed  #14 x 0   Entered and Authorized by:   D. Drema Pry DO   Signed by:   D. Drema Pry DO on 05/23/2009   Method used:    Electronically to        [facility]  [phone]* (retail)       [address]       [place], [place]  [address]       Ph: [phone] or [phone]       Fax: [phone]   RxID:   [id number]

## 2010-05-23 NOTE — Letter (Signed)
Summary: [place], [place] [address] [place]   [place], [place] [address]   Phone: [phone]  Fax: [phone]         January 05, 2010 MRN: [id number]   [facility] [place], [place]  [address]   Dear Mr. Rightmyer,  We have reviewed your cholesterol results.  They are as follows:     Total Cholesterol:    134 (Desirable: less than 200)       HDL  Cholesterol:     37.10  (Desirable: greater than 40 for men and 50 for women)       LDL Cholesterol:       66  (Desirable: less than 100 for low risk and less than 70 for moderate to high risk)       Triglycerides:       153.0  (Desirable: less than 150)  Our recommendations include:  Your HDL is low.  Exercise will help to increase this if you can exerise more.   Call our office at the number listed above if you have any questions.  Lowering your LDL cholesterol is important, but it is only one of a large number of "risk factors" that may indicate that you are at risk for heart disease, stroke or other complications of hardening of the arteries.  Other risk factors include:   A.  Cigarette Smoking* B.  High Blood Pressure* C.  Obesity* D.   Low HDL Cholesterol (see yours above)* E.   Diabetes Mellitus (higher risk if your is uncontrolled) F.  Family history of premature heart disease G.  Previous history of stroke or cardiovascular disease           *These are risk factors YOU HAVE CONTROL OVER.  For more information, visit .  There is now evidence that lowering the TOTAL CHOLESTEROL AND LDL CHOLESTEROL can reduce the risk of heart disease.  The American Heart Association recommends the following guidelines for the treatment of elevated cholesterol:  1.  If there is now current heart disease and less than two risk factors, TOTAL CHOLESTEROL should be less than 200 and LDL CHOLESTEROL should be less than 100. 2.  If there is current heart disease or two or  more risk factors, TOTAL CHOLESTEROL should be less than 200 and LDL CHOLESTEROL should be less than 70.  A diet low in cholesterol, saturated fat, and calories is the cornerstone of treatment for elevated cholesterol.  Cessation of smoking and exercise are also important in the management of elevated cholesterol and preventing vascular disease.  Studies have shown that 30 to 60 minutes of physical activity most days can help lower blood pressure, lower cholesterol, and keep your weight at a healthy level.  Drug therapy is used when cholesterol levels do not respond to therapeutic lifestyle changes (smoking cessation, diet, and exercise) and remains unacceptably high.  If medication is started, it is important to have you levels checked periodically to evaluate the need for further treatment options.    Thank you,      Sim Boast, RN for Dr.  Minus Breeding [facility] Team

## 2010-05-23 NOTE — Assessment & Plan Note (Signed)
Summary: pc2      Allergies Added: NKDA  Primary Provider:  Jennings Books DO  CC:  device check.  History of Present Illness: [name] [name] is seen in follow up for an ICD implanted for primary prevention in the setting of ischemic heart disease and nonsustained ventricular tachycardia.   He is the son of Mr. Cobain Krikorian who was a long-standing patient of mine who died in the spring of 2010  he is severe three-vessel coronary artery disease diagnosed by catheterization in 2007. MRI had demonstrated non-viability it was elected to treat him medically.  The patient denies SOB, chest pain, edema or palpitations; effect he says he is doing beautifully. He has lost 30 pounds walking at work and on the golf [place] with the dogs   Intercurrent echo in 2008 demonstrated ejection fraction improved from 20-45%     Current Medications (verified): 1)  Aspirin Ec 325 Mg Tbec (Aspirin) .... Take 1 Tablet By Mouth Once A Day 2)  Coreg 25 Mg Tabs (Carvedilol) .... Take 1 Tablet By Mouth Twice A Day 3)  Enalapril Maleate 10 Mg  Tabs (Enalapril Maleate) .... Two Times A Day 4)  Lantus Solostar 100 Unit/ml Soln (Insulin Glargine) .... Inject 35 Units To 50 Units Subcutaneously At Bedtime 5)  Lasix 40 Mg Tabs (Furosemide) .... Take 1 Tablet By Mouth Every Morning 6)  Lipitor 80 Mg Tabs (Atorvastatin Calcium) .... Take 1 Tablet By Mouth At Bedtime 7)  Metformin Hcl 1000 Mg  Tabs (Metformin Hcl) .... One Po Two Times A Day 8)  Protonix 40 Mg Tbec (Pantoprazole Sodium) .... Take 1 Tablet By Mouth Once A Day 9)  Novolog Flexpen 100 Unit/ml  Soln (Insulin Aspart) .... Inject 12 Units Subcutaneously Before Dinner  and Inject 12 Units Subcutaneously Before Lunch 10)  Freestyle Lancets   Misc (Lancets) .... Use One Lancet Three Times Daily 11)  Freestyle Test   Strp (Glucose Blood) .... Test Blood Sugar Three Times A Day 12)  Fish Oil Concentrate 1000 Mg  Caps (Omega-3 Fatty Acids) .... 2 Caps By Mouth Bid 13)   Bd U/f Short Pen Needle 31g X 8 Mm  Misc (Insulin Pen Needle) .... Use For Insulin Injections Three Times A Day 14)  Amlodipine Besylate 2.5 Mg Tabs (Amlodipine Besylate) .... One By Mouth Qd 15)  Vitamin D 2000 Unit Caps (Cholecalciferol) .... Take 1 Capsule By Mouth Once A Day 16)  Colcrys 0.6 Mg Tabs (Colchicine) .... One By Mouth Once Daily  Allergies (verified): No Known Drug Allergies  Past History:  Past Medical History: Last updated: 05/23/2009 Ischemic cardiomyopathy  Coronary artery disease (triple vessel) History of nonsustained ventricular tachycardia status post implantation of ICD Cardiomyopathy improved from 20 % to  45% Diabetes Mellitus Type II  Hypertension Obstructive sleep apnea         Past Surgical History: Last updated: 05/23/2009 Left forearm signs  ICD - Medtronic Atlas cardioverter defibrillator/pacemaker (may 2007)    Family History: Last updated: 05/23/2009 Family history of CAD - mother no colon cancer      Social History: Last updated: 05/23/2009 Occupation:  Back to work for [employer] Married with 2 daughers Never Smoked   Alcohol use-no         Vital Signs:  Patient profile:   65 year old male Height:      73 inches Weight:      210 pounds BMI:     27.81 Pulse rate:   66 /  minute Pulse rhythm:   regular BP sitting:   120 / 80  (right arm) Cuff size:   regular  Vitals Entered By: Doug Sou CMA (October 11, 2009 9:36 AM)  Physical Exam  General:  Well developed, well nourished, in no acute distress. Neck:  supplethe flat neck veins Lungs:  clear Heart:  regular rate and rhythm with an S4 and an early systolic murmur heard at the apex Abdomen:  soft nontender without hepatomegaly or midline pulsation Msk:  Back normal, normal gait. Muscle strength and tone normal. Extremities:  without clubbing cyanosis or edema Neurologic:  grossly normal Skin:  warm and dry    ICD Specifications Following MD:  Virl Axe, MD      ICD Vendor:  St Jude     ICD Model Number:  [id number]     ICD Serial Number:  [id number] ICD DOI:  09/07/2005     ICD Implanting MD:  Virl Axe, MD  Lead 1:    Location: RV     DOI: 09/07/2005     Model #: [id number]     Serial #: [id number]     Status: active  Indications::  ICM   ICD Follow Up Remote Check?  No Battery Voltage:  2.64 V     Charge Time:  11.5 seconds     Underlying rhythm:  SR ICD Dependent:  No       ICD Device Measurements Right Ventricle:  Amplitude: 12 mV, Impedance: 510 ohms, Threshold: 0.75 V at 0.5 msec Shock Impedance: 30 ohms   Episodes Shock:  0     ATP:  0     Nonsustained:  0     Ventricular Pacing:  <1%  Brady Parameters Mode VVI     Lower Rate Limit:  40      Tachy Zones VF:  240     VT:  200     VT1:  171     Next Remote Date:  01/12/2010     Next Cardiology Appt Due:  09/22/2010 Tech Comments:  Normal device function.  No changes made today. Pt does Merlin transmissions.  ROV 12 months SK. Chanetta Marshall RN BSN  October 11, 2009 9:45 AM   Impression & Recommendations:  Problem # 1:  .dfuIMPLANTATION OF DEFIBRILLATOR, ST JUDE ATLAS V168 (ICD-V45.02) Device parameters and data were reviewed and no changes were made  Problem # 2:  CAD (ICD-414.00) stable without angina;   Problem # 3:  COMBINED HEART FAILURE, CHRONIC (ICD-428.42) heart failure continues to improve as he exercises and loses weight he's probably class IB at this point   Problem # 4:  VENTRICULAR TACHYCARDIA-NONSUSTAINED (ICD-427.1) There has been no intercurrent ventricular tachycardia His updated medication list for this problem includes:    Aspirin Ec 325 Mg Tbec (Aspirin) .Marland Kitchen... Take 1 tablet by mouth once a day    Coreg 25 Mg Tabs (Carvedilol) .Marland Kitchen... Take 1 tablet by mouth twice a day    Enalapril Maleate 10 Mg Tabs (Enalapril maleate) .Marland Kitchen..Marland Kitchen Two times a day    Amlodipine Besylate 2.5 Mg Tabs (Amlodipine besylate) ..... One by mouth qd  Patient Instructions: 1)  You are scheduled for a device  check from home on  January 12, 2010. You may send your transmission at any time that day. If you have a wireless device, the transmission will be sent automatically. After your physician reviews your transmission, you will receive a postcard with your next transmission date. 2)  Your physician wants  you to follow-up in: 12 months Dr Caryl Comes.  You will receive a reminder letter in the mail two months in advance. If you don't receive a letter, please call our office to schedule the follow-up appointment. 3)  Your physician recommends that you continue on your current medications as directed. Please refer to the Current Medication list given to you today.

## 2010-05-23 NOTE — Letter (Signed)
   [facility] [place], [place]  [address]  [place] Phone: [phone]      May 30, 2009   [facility] [address] [place], [place] [address]  RE:  LAB RESULTS  Dear  Mr. Erazo,  The following is an interpretation of your most recent lab tests.  Please take note of any instructions provided or changes to medications that have resulted from your lab work.  ELECTROLYTES:  Good - no changes needed  KIDNEY FUNCTION TESTS:  Good - no changes needed    DIABETIC STUDIES:  Fair - schedule a follow-up appointment Blood Glucose: 213   HgbA1C: 8.9   Microalbumin/Creatinine Ratio: 4.7          Sincerely Yours,    Dr. Drema Pry

## 2010-05-23 NOTE — Progress Notes (Signed)
Summary: Amlodipine Refill  Phone Note Refill Request Message from:  Fax from Pharmacy on April 26, 2009 8:31 AM  Refills Requested: Medication #1:  AMLODIPINE BESYLATE 2.5 MG TABS one by mouth qd.   Dosage confirmed as above?Dosage Confirmed   Brand Name Necessary? No   Supply Requested: 1 month   Last Refilled: 03/18/2009  Method Requested: Electronic Next Appointment Scheduled: 04-29-09 9AM dR Tracey Stewart  Initial call taken by: Shanon Payor,  April 26, 2009 8:33 AM    Prescriptions: AMLODIPINE BESYLATE 2.5 MG TABS (AMLODIPINE BESYLATE) one by mouth qd  #30 x 2   Entered by:   Jiles Garter CMA   Authorized by:   D. Drema Pry DO   Signed by:   Jiles Garter CMA on 04/26/2009   Method used:   Electronically to        [facility]  [phone]* (retail)       [address]       [place], [place]  [address]       Ph: [phone] or [phone]       Fax: [phone]   RxID:   [id number]

## 2010-05-23 NOTE — Assessment & Plan Note (Signed)
Summary: 3 month follow up/mhf   Vital Signs:  Patient profile:   65 year old male Weight:      210 pounds BMI:     27.81 O2 Sat:      98 % on Room air Temp:     98.2 degrees F oral Pulse rate:   83 / minute Pulse rhythm:   regular Resp:     18 per minute BP sitting:   100 / 60  (left arm) Cuff size:   large  Vitals Entered By: Jiles Garter CMA (October 11, 2009 3:34 PM)  O2 Flow:  Room air CC: Rm 3- 6 Month Follow up , Type 2 diabetes mellitus follow-up Is Patient Diabetic? Yes Did you bring your meter with you today? Yes Pain Assessment Patient in pain? no        Primary Care Provider:  DDrema Pry DO  CC:  Rm 3- 6 Month Follow up  and Type 2 diabetes mellitus follow-up.  History of Present Illness:  Type 2 Diabetes Mellitus Follow-Up      This is a 65 year old man who presents for Type 2 diabetes mellitus follow-up.  The patient denies self managed hypoglycemia and hypoglycemia requiring help.  The patient denies the following symptoms: chest pain.  Since the last visit the patient reports good dietary compliance, compliance with medications, and monitoring blood glucose.  low blood sugar 95 high 201, avg 140-160's , dinner range 90-100. elevation is usually in the morining hours.  htn - much better since adding low dose amlodipine  cardiomyopathy - consultant notes reviewed  Allergies (verified): No Known Drug Allergies  Past History:  Past Medical History: Ischemic cardiomyopathy  Coronary artery disease (triple vessel) History of nonsustained ventricular tachycardia status post implantation of ICD Cardiomyopathy improved from 20 % to  45% Diabetes Mellitus Type II  Hypertension Obstructive sleep apnea         Dr. Creig Hines  Family History: Family history of CAD - mother no colon cancer       Physical Exam  General:  alert, well-developed, and well-nourished.   Lungs:  normal respiratory effort and normal breath sounds.   Heart:  normal rate,  regular rhythm, and no gallop.   Extremities:  No lower extremity edema  Neurologic:  cranial nerves II-XII intact.   Psych:  normally interactive, good eye contact, not anxious appearing, and not depressed appearing.    Diabetes Management Exam:    Foot Exam (with socks and/or shoes not present):       Inspection:          Left foot: normal          Right foot: normal   Impression & Recommendations:  Problem # 1:  DIABETES MELLITUS, TYPE II, UNCONTROLLED (ICD-250.02) Pt reports good med compliance.  he notes post prandial blood sugars normal.  he is not use why blood sugar in AM is still high. repeat A1c.  I suggested pt use CPAP as OSA may be playing a role.  If diabetes control worse, consider endo consultation pt advised to f/u for yearly eye exam  His updated medication list for this problem includes:    Aspirin Ec 325 Mg Tbec (Aspirin) .Marland Kitchen... Take 1 tablet by mouth once a day    Enalapril Maleate 10 Mg Tabs (Enalapril maleate) .Marland Kitchen..Marland Kitchen Two times a day    Lantus Solostar 100 Unit/ml Soln (Insulin glargine) ..... Inject 35 units to 50 units subcutaneously at bedtime  Metformin Hcl 1000 Mg Tabs (Metformin hcl) ..... One po two times a day    Novolog Flexpen 100 Unit/ml Soln (Insulin aspart) ..... Inject 12 units subcutaneously before dinner  and inject 12 units subcutaneously before lunch  Orders: T- Hemoglobin A1C [id number]) T-Urine Microalbumin w/creat. ratio [phone])  Problem # 2:  HYPERTENSION (ICD-401.9) well controlled.  Maintain current medication regimen.  His updated medication list for this problem includes:    Coreg 25 Mg Tabs (Carvedilol) .Marland Kitchen... Take 1 tablet by mouth twice a day    Enalapril Maleate 10 Mg Tabs (Enalapril maleate) .Marland Kitchen..Marland Kitchen Two times a day    Lasix 40 Mg Tabs (Furosemide) .Marland Kitchen... Take 1 tablet by mouth every morning    Amlodipine Besylate 2.5 Mg Tabs (Amlodipine besylate) ..... One by mouth qd  Orders: T-Basic Metabolic Panel  (99991111)  BP today: 100/60 Prior BP: 120/80 (10/11/2009)  Labs Reviewed: K+: 4.1 (05/23/2009) Creat: : 1.2 (05/23/2009)   Chol: 100 (10/07/2008)   HDL: 30.90 (10/07/2008)   LDL: 50 (10/07/2008)   TG: 94.0 (10/07/2008)  Complete Medication List: 1)  Aspirin Ec 325 Mg Tbec (Aspirin) .... Take 1 tablet by mouth once a day 2)  Coreg 25 Mg Tabs (Carvedilol) .... Take 1 tablet by mouth twice a day 3)  Enalapril Maleate 10 Mg Tabs (Enalapril maleate) .... Two times a day 4)  Lantus Solostar 100 Unit/ml Soln (Insulin glargine) .... Inject 35 units to 50 units subcutaneously at bedtime 5)  Lasix 40 Mg Tabs (Furosemide) .... Take 1 tablet by mouth every morning 6)  Lipitor 80 Mg Tabs (Atorvastatin calcium) .... Take 1 tablet by mouth at bedtime 7)  Metformin Hcl 1000 Mg Tabs (Metformin hcl) .... One po two times a day 8)  Protonix 40 Mg Tbec (Pantoprazole sodium) .... Take 1 tablet by mouth once a day 9)  Novolog Flexpen 100 Unit/ml Soln (Insulin aspart) .... Inject 12 units subcutaneously before dinner  and inject 12 units subcutaneously before lunch 10)  Freestyle Lancets Misc (Lancets) .... Use one lancet three times daily 11)  Freestyle Test Strp (Glucose blood) .... Test blood sugar three times a day 12)  Fish Oil Concentrate 1000 Mg Caps (Omega-3 fatty acids) .... 2 caps by mouth bid 13)  Bd U/f Short Pen Needle 31g X 8 Mm Misc (Insulin pen needle) .... Use for insulin injections three times a day 14)  Amlodipine Besylate 2.5 Mg Tabs (Amlodipine besylate) .... One by mouth qd 15)  Vitamin D 2000 Unit Caps (Cholecalciferol) .... Take 1 capsule by mouth once a day 16)  Colcrys 0.6 Mg Tabs (Colchicine) .... One by mouth once daily  Patient Instructions: 1)  Please schedule a follow-up appointment in 4 months. Prescriptions: AMLODIPINE BESYLATE 2.5 MG TABS (AMLODIPINE BESYLATE) one by mouth qd  #90 x 1   Entered and Authorized by:   D. Drema Pry DO   Signed by:   D. Drema Pry DO on  10/11/2009   Method used:   Electronically to        [facility]  [phone]* (retail)       [address]       [place], [place]  [address]       Ph: [phone] or [phone]       Fax: [phone]   RxID:   [id number] FREESTYLE TEST   STRP (GLUCOSE BLOOD) test blood sugar three times a day  #100 x 5   Entered  and Authorized by:   D. Drema Pry DO   Signed by:   D. Drema Pry DO on 10/11/2009   Method used:   Electronically to        [facility]  [phone]* (retail)       [address]       [place], [place]  [address]       Ph: [phone] or [phone]       Fax: [phone]   RxID:   [id number] FREESTYLE LANCETS   MISC (LANCETS) use one lancet three times daily  #100 x 5   Entered and Authorized by:   D. Drema Pry DO   Signed by:   D. Drema Pry DO on 10/11/2009   Method used:   Electronically to        [facility]  [phone]* (retail)       [address]       [place], [place]  [address]       Ph: [phone] or [phone]       Fax: [phone]   RxID:   [id number] NOVOLOG FLEXPEN 100 UNIT/ML  SOLN (INSULIN ASPART) inject 12 units subcutaneously before dinner  and inject 12 units subcutaneously before lunch  #1 month x 5   Entered and Authorized by:   D. Drema Pry DO   Signed by:   D. Drema Pry DO on 10/11/2009   Method used:   Electronically to        [facility]  [phone]* (retail)       [address]       [place], [place]  [address]       Ph: [phone] or [phone]       Fax: [phone]   RxID:   [id number] PROTONIX 40 MG TBEC (PANTOPRAZOLE SODIUM) Take 1 tablet by mouth once a day  #90 x 1   Entered and Authorized by:   D. Drema Pry DO   Signed by:   D. Drema Pry DO on 10/11/2009   Method used:   Electronically to        [facility]  [phone]* (retail)       [address]       [place], [place]  [address]       Ph: [phone] or [phone]       Fax: [phone]   RxID:   [id number] METFORMIN HCL 1000 MG  TABS (METFORMIN HCL) one po two times a day  #180 x 1   Entered and Authorized by:   D. Drema Pry DO   Signed by:   D. Drema Pry DO on 10/11/2009   Method used:   Electronically to        [facility]  [phone]* (retail)       [address]       [place],   [address]       Ph: [phone] or [phone]       Fax: [phone]   RxID:   [id number] LIPITOR 80 MG TABS (ATORVASTATIN CALCIUM) Take 1 tablet by mouth at bedtime  #90 x 1   Entered and Authorized by:   D. Drema Pry DO   Signed by:   D. Drema Pry  DO on 10/11/2009   Method used:   Electronically to        [facility]  [phone]* (retail)       [address]       [place], [place]  [address]       Ph: [phone] or [phone]       Fax: [phone]   RxID:   [id number] LASIX 40 MG TABS (FUROSEMIDE) Take 1 tablet by mouth every morning  #90 x 1   Entered and Authorized by:   D. Drema Pry DO   Signed by:   D. Drema Pry DO on 10/11/2009   Method used:   Electronically to        [facility]  [phone]* (retail)       [address]       [place], [place]  [address]       Ph: [phone] or [phone]       Fax: [phone]   RxID:   [id number] LANTUS SOLOSTAR 100 UNIT/ML SOLN (INSULIN GLARGINE) inject 35 units to 50 units subcutaneously at bedtime  #1 month x 5   Entered and Authorized by:   D. Drema Pry DO   Signed by:   D. Drema Pry DO on 10/11/2009   Method used:   Electronically to        [facility]  [phone]* (retail)       [address]       [place], [place]  [address]       Ph: [phone] or [phone]       Fax: [phone]   RxID:   [id number] COREG 25 MG TABS  (CARVEDILOL) Take 1 tablet by mouth twice a day  #180 x 1   Entered and Authorized by:   D. Drema Pry DO   Signed by:   D. Drema Pry DO on 10/11/2009   Method used:   Electronically to        [facility]  [phone]* (retail)       [address]       [place], [place]  [address]       Ph: [phone] or [phone]       Fax: [phone]   RxID:   [id number] ENALAPRIL MALEATE 10 MG  TABS (ENALAPRIL MALEATE) two times a day  #180 x 1   Entered and Authorized by:   D. Drema Pry DO   Signed by:   D. Drema Pry DO on 10/11/2009   Method used:   Electronically to        [facility]  [phone]* (retail)       [address]       [place], [place]  [address]       Ph: [phone] or [phone]       Fax: [phone]   RxID:   [id number]   Current Allergies (reviewed today): No known allergies

## 2010-05-23 NOTE — Cardiovascular Report (Signed)
Summary: Office Visit Remote  Office Visit Remote   Imported By: Sallee Provencal 08/10/2009 14:19:58  _____________________________________________________________________  External Attachment:    Type:   Image     Comment:   External Document

## 2010-05-23 NOTE — Cardiovascular Report (Signed)
Summary: Office Visit   Office Visit   Imported By: Sallee Provencal 10/14/2009 09:55:18  _____________________________________________________________________  External Attachment:    Type:   Image     Comment:   External Document

## 2010-05-23 NOTE — Letter (Signed)
Summary: Remote Device Check  [facility] [place]  [id number] [place]   [place], [place] [address]   Phone: [phone]  Fax: [phone]     May 05, 2009 MRN: [id number]   [facility] [place], [place]  [address]   Dear Mr. Pirro,   Your remote transmission was recieved and reviewed by your physician.  All diagnostics were within normal limits for you.  __X___Your next transmission is scheduled for:  July 25, 2009.  Please transmit at any time this day.  If you have a wireless device your transmission will be sent automatically.      Sincerely,  Hotel manager

## 2010-05-26 NOTE — Cardiovascular Report (Signed)
Summary: Office Visit Remote   Office Visit Remote   Imported By: Sallee Provencal 02/02/2010 11:14:07  _____________________________________________________________________  External Attachment:    Type:   Image     Comment:   External Document

## 2010-05-31 NOTE — Cardiovascular Report (Signed)
Summary: Office Visit Remote   Office Visit Remote   Imported By: Sallee Provencal 05/22/2010 12:09:28  _____________________________________________________________________  External Attachment:    Type:   Image     Comment:   External Document

## 2010-05-31 NOTE — Letter (Signed)
Summary: Remote Device Check  [facility], [place]  [id number] [place]   [place], [place] [address]   Phone: [phone]  Fax: [phone]     May 21, 2010 MRN: [id number]   [facility] [place], [place]  [address]   Dear Mr. Danek,   Your remote transmission was recieved and reviewed by your physician.  All diagnostics were within normal limits for you.  __X___Your next transmission is scheduled for: 07-20-2010.  Please transmit at any time this day.  If you have a wireless device your transmission will be sent automatically.    Sincerely,  Shelly Bombard

## 2010-07-20 ENCOUNTER — Encounter (INDEPENDENT_AMBULATORY_CARE_PROVIDER_SITE_OTHER): Payer: Medicare Other | Admitting: *Deleted

## 2010-07-20 DIAGNOSIS — R0989 Other specified symptoms and signs involving the circulatory and respiratory systems: Secondary | ICD-10-CM

## 2010-07-23 ENCOUNTER — Encounter: Payer: Self-pay | Admitting: *Deleted

## 2010-08-02 LAB — GLUCOSE, CAPILLARY
Glucose-Capillary: 188 mg/dL — ABNORMAL HIGH (ref 70–99)
Glucose-Capillary: 198 mg/dL — ABNORMAL HIGH (ref 70–99)

## 2010-09-05 NOTE — Assessment & Plan Note (Signed)
[facility]                          CHRONIC HEART FAILURE NOTE   [name], [name]                     MRN:          [id number]  DATE:09/30/2006                            DOB:          04-03-1946    PRIMARY CARDIOLOGIST:  Dr. Minus Breeding.   PRIMARY CARE DOCTOR:  Dr. Drema Pry.   ELECTROPHYSIOLOGIST:  Dr. Jolyn Nap.   [name] [name] returns today for followup of his congestive heart failure  which is secondary to ischemic cardiomyopathy.  [name] [name] has been  doing quite well.  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  He denies any episodes of chest pain,  palpitations, presyncope, syncopal episodes.  Denies any firing from his  defibrillator.  Recently saw [name] and is scheduled for followup  appointment in a few months.  He states his average CBGs are running  between 100 and 130.  He complains of just very mild swelling in his  right ankle over the last day or so, but his weight has remained stable  at home.  Overall he continues to do quite well.   PAST MEDICAL HISTORY:  1. Congestive heart failure secondary to ischemic cardiomyopathy with      an EF currently 30% - 35%, previously 20%.  2. Coronary artery disease status post out of hospital MI.  Cardiac      catheterization showed severe 3 vessel disease.  Status post      cardiac MRI revealing nonviable tissue in the middle apical,      anterior, and septal walls with full thickness scarring.  3. Nonsustained v-tach, status post implantation of a Medtronic Atlas      2 VR cardioverter defibrillator.  4. Diabetes.  5. Hypertension.  6. Dyslipidemia.  7. Severe obstructive sleep apnea with poor tolerance to CPAP.   REVIEW OF SYSTEMS:  As stated above.   CURRENT MEDICATIONS:  1. Enalapril 7.5 mg b.i.d.  2. Aspirin 325 mg daily.  3. Protonix 40 mg daily.  4.  Lipitor 80 mg daily.  5. Furosemide 40 mg daily.  6. CPAP nightly.  7. Coreg 25 mg b.i.d.  8. Metformin 1000 mg b.i.d.  9. NovoLog insulin and Lantus insulin as directed by [name].  10.Xanax 0.25 mg p.r.n.   PHYSICAL EXAMINATION:  Weight today stable at 243, blood pressure  131/80, manual 134/84, heart rate is 70.  [name] [name] is in no acute  distress, he is his usual pleasant, cooperative self.  No jugular vein  distention at 45 degree angle.  LUNGS:  Clear to auscultation bilaterally.  CARDIOVASCULAR:  Reveals an S1 and S2 regular rate and rhythm.  ABDOMEN:  Soft, nontender, positive bowel sounds.  LOWER EXTREMITIES:  Without clubbing, cyanosis, patient has just a trace  of ankle edema on the right outer lower extremity.  NEUROLOGICALLY:  He is alert and oriented x3.  SKIN:  Warm and dry.   ASSESSMENT:  Heart failure, secondary to ischemic cardiomyopathy.  Patient mildly hypertensive  today, will continue to titrate his  angiotensin converting enzyme inhibitor, Enalapril will be bumped to 10  mg p.o. b.i.d.  The patient has been given a new prescription.  Review  of lab work obtained on May 12 when patient saw Dr. Percival Spanish for his  routine 6 month visit potassium was stable at 3.8, BUN and creatinine 19  and 1.1.  will continue other medications as prescribed.      Rosanne Sack, ACNP  Electronically Signed      Shaune Pascal. Bensimhon, MD  Electronically Signed   MB/MedQ  DD: 09/30/2006  DT: 09/30/2006  Job #: [id number]   cc:   Sandy Salaam. Shawna Orleans, DO

## 2010-09-05 NOTE — Assessment & Plan Note (Signed)
[place] OFFICE NOTE   SHAMERE, WILHELMI                     MRN:          [id number]  DATE:09/23/2007                            DOB:          [birth year]    Mr. Spear is seen in follow up for an ICD implanted for primary  prevention in the setting of ischemic heart disease and nonsustained  ventricular tachycardia.  He continues to have problems with shortness  of breath with significant exertion.  His major complaint, however, is  fatigue.  He dates this back to his MI.   He has also intercurrently developed a persistent cough that has been  going on for 3 or 4 weeks.  It has been accompanied by some rhinorrhea.   MEDICATIONS:  1. Enalapril which may be contributing.  2. Aspirin.  3. Protonix.  4. Lipitor 80.  5. Furosemide 40.  6. Coreg 25 b.i.d.  7. Metformin 1000 b.i.d.  8. NovoLog.  9. Lantus.  10.Potassium.   PHYSICAL EXAMINATION:  VITAL SIGNS:  His blood pressure today was mildly  elevated at 150/86 with a pulse of 75.  His weight was 227, which is  down about 10 pounds since February.  LUNGS:  Clear.  NECK:  His neck veins were flat.  HEART:  His heart sounds were regular with an early systolic murmur.  ABDOMEN:  Soft.  EXTREMITIES:  No edema.   Interrogation of his [employer] ICD demonstrates an R-wave of greater than  12, an impedance of 465, a threshold 0.75 at 0.5.  Battery voltage is  3.15, high voltage impedance was 30, and there are no intercurrent  therapies.   IMPRESSION:  1. Ischemic cardiomyopathy.      a.     Prior myocardial infarction.      b.     Mitral regurgitation.      c.     Larger areas of non-viability.  2. Congestive heart failure - class II - systolic - chronic.  3. Fatigue, question related to his beta blocker.  4. Cough, question related to his enalapril.  5. Diabetes.  6. Hypertension.  7. Obesity with intercurrent 10-pound weight loss.   Mr.  Difalco weight loss is terrific.   I wonder whether the Coreg is contributing to his fatigue.  He is to see  Dr. Luan Pulling in the next 6 to 8 weeks or so, so I have asked him in the  interim to decrease his Coreg from 25 twice a day to 12.5 twice a day.  We will see if this has any impact on his fatigue.  Potentially, an  alternative beta-blocker might be of benefit.   In addition, I have asked him to follow up with [name] about his cough.  He is supposed to see him in the next 3-4 weeks.  If that does not  improve, I would ask him to consider whether there may be a bradykinin  component from his ACE inhibitor.   We will plan to see him again in one year's time.     Deboraha Sprang, MD, [facility]  Electronically  Signed    SCK/MedQ  DD: 09/23/2007  DT: 09/23/2007  Job #: [id number]

## 2010-09-05 NOTE — Assessment & Plan Note (Signed)
[facility]                          CHRONIC HEART FAILURE NOTE   [name] [name], [name] [name]                     MRN:          [id number]  DATE:11/19/2006                            DOB:          May 06, 1945    PRIMARY CARDIOLOGIST:  Minus Breeding, MD, [facility]   PRIMARY CARE PHYSICIAN:  Sandy Salaam. Shawna Orleans, DO   ELECTROPHYSIOLOGY:  Deboraha Sprang, MD, [facility]   [name] returns today for followup of his congestive heart failure  which is secondary to ischemic cardiomyopathy. Mr. Warmath states that  he has been doing quite well. He continues to remain fairly active at  home. He does all his own yard work and maintains his swimming pool. 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 He denies any  episodes of chest discomfort, palpitations, pre-syncope or syncopal  episodes. He denies any firing from his defibrillator. Overall, he  continues to do quite well.   PAST MEDICAL HISTORY:  1. Congestive heart failure secondary to ischemic cardiomyopathy with      ejection fraction currently 30-35%.  2. Coronary artery disease status post possible myocardial infarction.      Cardiac catheterization showed severe three vessel disease. Cardiac      MRI revealed non-viable tissue in the middle, apical, anterior and      septal walls with full thickness scarring.  3. Nonsustained V-tach status post implantation of a Medtronic Atlas      2VR cardioverter defibrillator.  4. Diabetes, currently insulin dependent.  5. Hypertension.  6. Dyslipidemia.  7. Severe obstructive sleep apnea with poor tolerance to CPAP.   REVIEW OF SYSTEMS:  As stated above.   CURRENT MEDICATIONS:  1. Enalapril 10 mg b.i.d.  2. Aspirin 325 daily.  3. Protonix 40 daily.  4. Lipitor 80 q nightly.  5. Furosemide 40 mg daily.  6. CPAP q nightly p.r.n.  7. Coreg 25 mg b.i.d.  8. Metformin 1000 mg b.i.d.  9.  NovoLog 10 units subcutaneous prior to evening meal.  10.Lantus 20 units subcutaneous q nightly.   PHYSICAL EXAMINATION:  Weight is down 3 pounds currently at 240 pounds.  Blood pressure initially reported at 144/90 manually 138/82 with a heart  rate of 78. Mr. Franchetti is in no acute distress.  No jugular vein distention noted at a 45 degree angle.  LUNGS:  Clear to auscultation.  CARDIOVASCULAR: Reveals an S1, S2, regular rate and rhythm.  ABDOMEN: Soft and nontender. Positive bowel sounds.  LOWER EXTREMITIES: Without clubbing, cyanosis or edema.  NEUROLOGIC: The patient is alert and oriented x3.   IMPRESSION:  Congestive heart failure secondary to ischemic  cardiomyopathy with an ejection fraction currently 30-35% status post  ICD implantation. The patient class 1 to class 2 New [employer] Heart  Association. I have given him permission to return to work part-time  with a 30 pound weight restriction with light exertion. The patient to  followup with Dr.  Shawna Orleans for primary care. The patient states that he has  an appointment with Dr.  Shawna Orleans in August and has an appointment for blood  work prior to that time. I will review labs obtained at that time.      Rosanne Sack, ACNP  Electronically Signed      Shaune Pascal. Bensimhon, MD  Electronically Signed   MB/MedQ  DD: 11/19/2006  DT: 11/19/2006  Job #: [id number]

## 2010-09-05 NOTE — Assessment & Plan Note (Signed)
[place]                            CARDIOLOGY OFFICE NOTE   PEYSON, GALIZA                     MRN:          [id number]  DATE:06/08/2008                            DOB:          08-05-1945    PRIMARY CARE PHYSICIAN:  Sandy Salaam. Shawna Orleans, DO   REASON FOR PRESENTATION:  Evaluate the patient with coronary disease and  cardiomyopathy.   HISTORY OF PRESENT ILLNESS:  The patient presents for followup of the  above.  Since I last saw him, he has done well.  He works part-time.  He  walks other days.  He says that with this he has no chest discomfort,  neck, or arm discomfort.  He has had no palpitation, presyncope, or  syncope.  He denies any PND or orthopnea.  He has had no significant  shortness of breath.  He is wearing his CPAP rarely.  I did increase his  carvedilol back to 25 mg twice a day when I last saw him.  We had  reduced to see if the fatigue had gotten better, but it did not make a  difference.  Therefore, he is back to that dose of carvedilol without a  significant change.  He is unfortunately not wearing his CPAP routinely.   PAST MEDICAL HISTORY:  1. Cardiomyopathy (EF had been 20%.  It did improve to 45% on echo in      October 2008).  2. Coronary artery disease (status post out-of-hospital myocardial      infarction.  Cardiac catheterization showed 3-vessel coronary      disease.  The MRI revealed nonviable tissue in the mid apical,      anterior, and septal wall.  He was managed medically).  3. Nonsustained ventricular tachycardia (Medtronic Atlas 2VR      cardioverter defibrillator).  4. Diabetes mellitus.  5. Hypertension.  6. Dyslipidemia.  7. Severe obstructive sleep apnea (wearing CPAP sporadically).   ALLERGIES:  None.   MEDICATIONS:  1. Carvedilol 25 mg b.i.d.  2. Enalapril 10 mg b.i.d.  3. Aspirin 325 mg b.i.d.  4. Protonix 40 mg daily.  5. Lipitor 80 mg nightly.  6. Furosemide 40 mg daily.  7. CPAP.  8.  Metformin 1000 mg b.i.d.  9. NovoLog.  10.Lantus.  11.Potassium 20 mEq daily.   REVIEW OF SYSTEMS:  As stated in the HPI and otherwise negative for  other systems.   PHYSICAL EXAMINATION:  GENERAL:  The patient is in no distress.  VITAL SIGNS:  Blood pressure 140/88, heart rate 65 and regular, weight  227 pounds, and body mass index 30.  HEENT:  Eyelids are unremarkable, pupils equal, round, and reactive to  light, fundi not visualized, oral mucosa unremarkable.  NECK:  No jugular venous distention at 45 degrees, carotid upstroke  brisk and symmetric, no bruits, no thyromegaly.  LYMPHATICS:  No cervical, axillary, or inguinal adenopathy.  LUNGS:  Clear to auscultation bilaterally.  BACK:  No costovertebral angle tenderness.  CHEST:  Well-healed ICD pocket.  HEART:  PMI not displaced or sustained, S1 and S2 within normals limits,  no S3, no  S4, no clicks, no rubs, no murmurs.  ABDOMEN:  Obese, positive bowel sounds normal in frequency and pitch, no  bruits, no rebound, no guarding, no midline pulsatile mass, no  hepatomegaly, no splenomegaly.  SKIN:  No rashes, no nodules.  EXTREMITIES:  Pulses 2+.  No edema.   ASSESSMENT AND PLAN:  1. Cardiomyopathy.  The patient had a mildly reduced ejection fraction      with med titration.  He has class I symptoms.  At this point, he is      tolerating the meds as listed and we will continue on this regimen.  2. Coronary disease.  He is having no new symptoms.  We are managing      this medically.  He will continue with aggressive risk reduction.  3. Diabetes.  I did take this opportunity to review with him his      diabetes goals.  His hemoglobin A1c last was 7.5.  It has been as      high as 9.5 in the last couple of years.  He is due to have this      checked again.  We discussed the goal of less than 7.  He      understands the importance of this and will follow with [name] for      management.  4. Dyslipidemia.  I did review his labs.   The last labs were done      August 08, 2007.  His LDL was quite low at 51.7.  However, his HDL      was also in the low 30s.  I discussed with him doing more aerobic      exercise at a goal of 40 as his HDL.  He is due to get this checked      again in a few weeks.  I would suggest that if he is not there that      we should consider adding fenofibrate.  I would be happy to review      these labs.  He will be seeing [name] soon.  5. Obesity.  He has lost 9 pounds.  I encouraged more of this.  His      body mass index is still at 30 and then we will review this.  We      discussed weight loss with diet and exercise and target body mass      index well below 30.  6. Status post ICD.  The patient has followup in the EP Clinic.  7. Sleep apnea.  He has severe sleep apnea, but does not wear his      CPAP.  I told him the benefits of wearing it more consistently and      he is going to try to increase the frequency      with which he wears it.  8. Followup.  I would see him back yearly or sooner as needed.     Minus Breeding, MD, [facility]  Electronically Signed    JH/MedQ  DD: 06/08/2008  DT: 06/09/2008  Job #: [id number]   cc:   Sandy Salaam. Shawna Orleans, DO

## 2010-09-05 NOTE — Assessment & Plan Note (Signed)
[facility]                          CHRONIC HEART FAILURE NOTE   [name] [name], [name] [name]                     MRN:          [id number]  DATE:01/20/2007                            DOB:          Sep 09, 1945    PRIMARY CARDIOLOGIST:  Minus Breeding, M.D., [facility]   PRIMARY CARE PHYSICIAN:  Sandy Salaam. Shawna Orleans, D.O.   EP:  Deboraha Sprang, M.D., [facility]   [name] [name] returns today for follow up of his congestive heart failure  which is secondary to ischemic cardiomyopathy.  [name] [name] states he  has been doing quite well.  He continues to remain fairly active at  home.  He also has gone back to work part-time at [employer] where  he previously worked full-time.  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  He is very excited about this and would like to return to work full-  time.  He denies any symptoms suggestive of angina.  He denies any  firing from his defibrillator, presyncope, or syncopal episodes.  He  denies any symptom suggestive of volume overload.  Overall, he continues  to do quite well.  He states he checks his blood pressure at home, and  it runs 116-120.  The highest he has ever gotten systolic was [id number], with  a diastolic in the [id number].  He states his blood sugars have been running  quite well.  The highest CBG was 130.  Diabetes is managed by [name].   PAST MEDICAL HISTORY:  1. Congestive heart failure secondary to ischemic cardiomyopathy with      EF currently 30-35%.  Last echocardiogram in January of this year.  2. Coronary artery disease status post possible MI.  Cardiac      catheterization showed severe three-vessel disease.  Cardiac MRI      revealed nonviable tissue in the middle apical anteroseptal walls      with full-thickness scarring.  3. Nonsustained ventricular tachycardia status post implantation  of a      Medtronic Atlas 2 VR cardioverter-defibrillator.  4. Diabetes.  5. Hypertension.  6. Dyslipidemia.  7. Severe obstructive sleep apnea with CPAP use.   REVIEW OF SYSTEMS:  As stated above.   CURRENT MEDICATIONS:  1. Enalapril 10 mg b.i.d.  2. Aspirin 325.  3. Protonix 40.  4. Lipitor 40.  5. Furosemide 40.  6. CPAP q.h.s.  7. Coreg 25 mg b.i.d.  8. Metformin 1000 mg b.i.d.  9. NovoLog and Lantus insulin as directed.  10.Xanax p.r.n.   PHYSICAL EXAMINATION:  VITAL SIGNS:  Weight is down to 236, previous  weight 240.  The patient has lost 7 pounds since June.  Blood pressure  initially recorded at 151/94, manual left arm 132/80, with a heart rate  of 77.  GENERAL:  [name] [name] is in no acute distress.  He is his usual pleasant  self.  NECK:  No jugular vein distension noted at 45-degree angle.  LUNGS:  Clear to auscultation bilaterally.  CARDIOVASCULAR:  S1 and S2.  Regular rate and rhythm.  ABDOMEN:  Soft, nontender.  Positive bowel sounds.  EXTREMITIES:  Lower extremities without clubbing, cyanosis, or edema.  NEUROLOGIC:  Alert and oriented x3.   IMPRESSION:  Congestive heart failure secondary to ischemic  cardiomyopathy without signs of volume overload at this time.  I would  place the patient in a Class I New [place] Heart Association status at this  time.  We will continue current medications.  I have asked [name] [name]  to check his blood pressure daily and record it.  We may need to  increase his enalapril.  I also would like to repeat his 2-D  echocardiogram.  I am going to clear the patient to return to work.  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  Also, we will schedule [name] [name]  to follow up with Dr. Percival Spanish for a routine cardiology visit.      Rosanne Sack, ACNP  Electronically Signed      Minus Breeding, MD, [facility]  Electronically Signed   MB/MedQ  DD: 01/20/2007  DT: 01/20/2007  Job #: [id number]

## 2010-09-05 NOTE — Assessment & Plan Note (Signed)
[name]                            CARDIOLOGY OFFICE NOTE   [name] [name], [name] [name]                     MRN:          [id number]  DATE:06/13/2007                            DOB:          01-26-1946    The primary is Dr. Drema Pry.   REASON FOR PRESENTATION:  Evaluate patient with cardiomyopathy.   HISTORY OF PRESENT ILLNESS:  The patient returns for follow-up.  He has  been doing well since I last saw him.  He has been followed in the heart  failure clinic and has had med titration.  He denies any current  symptoms.  He has had no shortness of breath, PND or orthopnea.  He has  had no palpitations, presyncope or syncope.  He does not exercise  routinely, but he does walk a lot at work.   PAST MEDICAL HISTORY:  1. Cardiomyopathy (EF had been 20%.  The most recent was 45%).  2. Coronary artery disease status post out of hospital myocardial      infarction (catheterization showed three-vessel disease.  MRI      revealed nonviable tissue in the mid apical, anterior, and septal      wall.  He was managed medically).  3. Nonsustained ventricular tachycardia (Medtronic Atlas II VR      cardioverter defibrillator).  4. Diabetes mellitus.  5. Hypertension.  6. Dyslipidemia.  7. Severe obstructive sleep apnea with poor tolerance to CPAP.   ALLERGIES/INTOLERANCES:  None.   MEDICATIONS:  1. Enalapril 10 mg b.i.d.  2. Aspirin 325 mg daily.  3. Protonix 40 mg daily.  4. Lipitor 80 mg every night.  5. Furosemide 40 mg daily.  6. CPAP.  7. Coreg 25 mg b.i.d.  8. Metformin 1000 mg b.i.d.  9. NovoLog.  10.Lantus.   REVIEW OF SYSTEMS:  As stated in the HPI and otherwise have other  systems.   PHYSICAL EXAMINATION:  The patient is in no distress.  Blood pressure  144/89, heart rate 72 and regular, weight 238 pounds, body mass index  32.  HEENT:  Eyelids unremarkable.  Pupils equal, round, reactive to light.  Fundi not visualized.  Oral mucosa  remarkable.  NECK:  No jugular distention, waveform within normal limits, carotid  upstroke brisk and symmetrical.  No bruits, no thyromegaly.  LYMPHATICS:  No cervical, axillary, inguinal adenopathy.  LUNGS:  Clear to auscultation bilaterally.  BACK:  No costovertebral angle tenderness.  CHEST:  Well-healed ICD pocket.  HEART:  PMI not displaced or sustained, S1-S2 within normal limits, no  S3, no S4, no clicks, rubs, no murmurs.  ABDOMEN:  Obese, positive bowel sounds, normal in frequency and pitch,  no bruits, no rebound, no guarding, no midline pulsatile mass, no  hepatomegaly, no splenomegaly.  SKIN:  No rashes, no nodules.  EXTREMITIES:  2+ pulses throughout.  No cyanosis or clubbing, no edema.  NEUROLOGICAL:  Oriented to person, place, and time.  Cranial nerves II-  XII grossly intact, motor grossly intact.   EKG:  Sinus rhythm, rate 77, left axis deviation, lateral T-wave  inversions unchanged from previous EKGs.  ASSESSMENT/PLAN:  1. Coronary disease.  Patient is having no angina.  The patient will      continue with medical management and risk reduction.  2. Hypertension.  Blood pressure slightly elevated, but it has not      been at other appointments, and therefore I will make no change      except to suggest weight loss.  3. Obesity.  We discussed this and the need to lose weight with diet,      exercise.  4. Cardiomyopathy.  He is euvolemic and has class I symptoms.  He is      on a good medical regimen and will continue with this.  5. Status post ICD.  This will be followed per routine.  6. Follow-up.  I will see the patient again in about four months or      sooner if needed.     Minus Breeding, MD, [name]  Electronically Signed    JH/MedQ  DD: 06/13/2007  DT: 06/14/2007  Job #: [id number]   cc:   Sandy Salaam. Shawna Orleans, DO

## 2010-09-05 NOTE — Assessment & Plan Note (Signed)
[place] OFFICE NOTE   [name] [name], [name] [name]                     MRN:          [id number]  DATE:08/05/2006                            DOB:          May 31, 1945    [name] [name] is seen.  He is status post ICD implantation for primary  prevention in the setting of ischemic heart disease.  He is doing pretty  well.  He has some shortness of breath when he pushes too hard, but he  has no exertional problems the following day.   MEDICATIONS:  Are reviewed.  He notes that since the institution of  insulin he has had problems with his weight.  He is also on metformin,  Coreg, CPAP which he uses sparingly, Enalapril 5 b.i.d., furosemide,  Lipitor, Protonix, aspirin.   PHYSICAL EXAMINATION:  VITAL SIGNS: Blood pressure 128/90.  LUNGS:  Clear.  CARDIAC:  Heart sounds are regular.  EXTREMITIES: Without edema.   Interrogation of his [place] [phone] ICD demonstrates an R wave of  12, impedance of 475, threshold 1 volt at 0.5.  There are no  intercurrent episodes.   IMPRESSION:  1. Ischemic cardiomyopathy.      a.     Status post myocardial infarction.      b.     Large nonviable areas mitral regurgitation.      c.     Ejection fraction 30-35%.  2. Nonsustained ventricular tachycardia with implantable cardioverter-      defibrillator implantation.  3. Diabetes.  4. Hypertension.  5. Obesity.   [name] [name] is stable from an arrhythmia point of view.  He is having no  evidence of ischemia.  We did talk about his obesity and encouraged him  in weight loss and his diet.  Dietary consultation may be helpful.  In  addition, I have encouraged him to go with his wife to the [place] places  and just start working out on flat treadmills.   He is to see Dr. Percival Spanish in the next couple of weeks.  We will see him  again in one year's time and will follow him remotely in the interim.     Deboraha Sprang, MD, [facility]  Electronically Signed    SCK/MedQ  DD: 08/05/2006  DT: 08/05/2006  Job #: [id number]

## 2010-09-05 NOTE — Assessment & Plan Note (Signed)
[facility]                          CHRONIC HEART FAILURE NOTE   [name], [name]                     MRN:          [id number]  DATE:04/22/2007                            DOB:          1945-06-08    PRIMARY CARDIOLOGIST:  Dr. Minus Breeding.   PRIMARY CARE:  Dr. Drema Pry.   EP:  Dr. Jolyn Nap.   [name] [name] returns today for followup of his congestive heart failure  which is secondary to ischemic cardiomyopathy.  Mr. [name] has been  doing quite well.  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  He  states compliance with medication.  Denies any symptoms suggestive of  volume overload.  The only complaint today is of a cold.  He states his  CBG have been between 120 and 130s.  He denies any insomnia.  Denies any  problems from his defibrillator, light-headedness, dizziness,  palpitations, pre-syncope or syncopal episodes.  He is please with his  quality of life at this time.   PAST MEDICAL HISTORY:  1. Congestive heart failure secondary to ischemic cardiomyopathy with      EF previously 30% now 45% by echo.  2. Coronary artery disease.  Cardiac catheterization showing severe 3-      vessel disease, cardiac MRI revealed nonviable tissue in the middle      apical, anterior septal walls with full-thickness scarring.  3. History of nonsustained V-tach status post implantation of a      Medtronic Atlas cardioverter-defibrillator.  4. Diabetes.  5. History of hypertension.  6. Dyslipidemia.  7. Severe obstructive sleep apnea with CPAP compliance.   REVIEW OF SYSTEMS:  As stated above.   CURRENT MEDICATIONS:  1. enalapril 10 mg b.i.d.  2. Aspirin 325.  3. Protonix 40.  4. Lipitor 80.  5. Furosemide 40.  6. CPAP q.h.s.  7. Coreg 25 b.i.d.  8. Metformin 1000 b.i.d.  9. NovoLog and Lantus insulin as directed.   PHYSICAL EXAM:  Weight 239, blood pressure  147/85, repeat blood pressure  132/80, heart rate 66.  No signs of jugular vein distention at 45 degrees angle.  Lungs are clear to auscultation bilaterally.  Cardiovascular exam reveals S1-S2, regular rate and rhythm.  ABDOMEN:  Soft, nontender, positive bowel sounds.  LOWER EXTREMITIES:  Without clubbing, cyanosis or edema.  Neurologically alert and oriented x3.   IMPRESSION:  Congestive heart failure secondary to ischemic  cardiomyopathy with ejection fraction currently 45%.  Patient class I  symptoms.  Continue current medical regimen.  Have the patient follow up  with Dr. Percival Spanish for routine cardiology visit.  [name] has done  quite well over the last year.  I am going to move his heart failure  clinic visits out to every 3 to 4 months.  He has a follow-up  appointment with [name], with Dr. Caryl Comes in April 2009.  Also check lab  work today.      Rosanne Sack, ACNP  Electronically Signed      Minus Breeding, MD, [facility]  Electronically Signed   MB/MedQ  DD: 04/22/2007  DT: 04/22/2007  Job #: [id number]   cc:   Sandy Salaam. Shawna Orleans, DO

## 2010-09-05 NOTE — Assessment & Plan Note (Signed)
[facility] OFFICE NOTE   CHRISANGEL, FURFARO                     MRN:          [id number]  DATE:09/02/2006                            DOB:          July 17, 1945    PRIMARY CARE PHYSICIAN:  Dr. Jennings Books.   REASON FOR PRESENTATION:  Patient with ischemic cardiomyopathy.   HISTORY OF PRESENT ILLNESS:  The patient returns for followup of the  above.  He has been doing well and has been followed in the heart  failure clinic.  He has had medication titration.  He does get short of  breath walking up an incline.  However, with other activities such as  walking daily or pushing his lawnmower on level ground, he does not have  shortness of breath.  He does not have any resting shortness of breath,  denies any PND or orthopnea.  He has no palpitations, pre-syncope or  syncope.  He tolerated an increased dose of enalapril to 5 mg b.i.d. in  March.   PAST MEDICAL HISTORY:  Ischemic cardiomyopathy (EF had been 20%,  currently 30-35%), coronary artery disease (out of hospital myocardial  infarction, three-vessel coronary artery disease.  Nonviable tissue in  the middle apical and anterior and septal walls with full thickness  scarring on MRI).  __________ ventricular tachycardia, status post  implantation of Medtronic Atlas to VR cardioverted fibrillator,  diabetes, hypertension, dyslipidemia, obesity, severe obstructive sleep  apnea with poor tolerance to CPAP.   ALLERGIES:  None.   CURRENT MEDICATIONS:  1. Enalapril 5 mg b.i.d.  2. Aspirin 325 mg daily.  3. Protonix 40 mg daily.  4. Lipitor 80 mg at bedtime.  5. Furosemide 40 mg daily.  6. CPAP.  7. Coreg 25 mg b.i.d.  8. Metformin 1000 mg b.i.d.  9. NovoLog.  10.Lantus.   REVIEW OF SYSTEMS:  As stated in the HPI, otherwise negative per other  systems.   PHYSICAL EXAMINATION:  The patient is in no distress.  Blood pressure  136/83, heart rate 65 and  regular, weight 241 pounds, body mass index  61.  HEENT:  Eyes unremarkable, pupils are equal, round and reactive to  light.  Normal fundi were visualized.  NECK:  No jugular venous distention at 45 degrees.  CARDIOVASCULAR:  Upstroke brisk and symmetric, no bruits. No  thyromegaly.  LYMPHATICS:  No adenopathy.  LUNGS:  Clear to auscultation bilaterally.  BACK:  No costovertebral angle tenderness.  CHEST:  Unremarkable.  Well-healed ICD pocket.  HEART:  PMI not displaced or sustained. S1 and S2 within normal limits,  no S3, no S4, no murmurs.  ABDOMEN:  Obese.  Normal pulses and bowel sounds normal.  No frequency  or pitch.  No bruits, rebound, guarding in the midline pulsatile mass,  no organomegaly.  SKIN:  No rashes.  EXTREMITIES:  2+ pulses, no edema.   ASSESSMENT AND PLAN:  1. Cardiomyopathy.  Today I will increase enalapril to 7.5 mg b.i.d.      He did not get his blood work as he was supposed to.  He is going  to get a BMET today.  He will continue other medicines as listed.  2. Coronary disease.  He will continue with secondary risk reduction.      No further cardiovascular testing is planned.  3. Followup.  Will see the patient back in about four weeks in the      heart-failure clinic to see if we can achieve more medication      titration.     Minus Breeding, MD, [facility]     JH/MedQ  DD: 09/02/2006  DT: 09/02/2006  Job #: [id number]   cc:   Sandy Salaam. Shawna Orleans, DO

## 2010-09-05 NOTE — Assessment & Plan Note (Signed)
[name] [name]                            CARDIOLOGY OFFICE NOTE   [name] [name], [name] [name]                     MRN:          [id number]  DATE:11/28/2007                            DOB:          [birth year]    PRIMARY CARE PHYSICIAN:  [name] Salaam. Shawna Orleans, DO   REASON FOR PRESENTATION:  Evaluate the patient with coronary artery  disease and cardiomyopathy.   HISTORY OF PRESENT ILLNESS:  The patient presents for followup of the  above.  Since I last saw him, he saw Dr. Caryl Comes.  Because of fatigue, he  had his carvedilol decreased from 25 mg twice a day to 12.5.  However,  he had no difference in this.  It turns out that the patient is not  wearing his CPAP at all.  He says he is tired during the day, but he  does do his activities and stays actually quite active.  He is able to  some yard work and does work 30 hours a week.  He does fall asleep  easily if he sits down.  He does not have any chest pressure, neck, or  arm discomfort.  He does not have any palpitation, presyncope, or  syncope.  He will get dyspneic if he walks up a significant incline, but  not at rest.   PAST MEDICAL HISTORY:  1. Cardiomyopathy (EF had been 20%.  The most recent was 45%).  2. Coronary artery disease status post out-of-hospital myocardial      infarction (catheterization showed three-vessel disease.  The MRI      revealed nonviable tissue in the mid apical, anterior, and septal      wall.  He was managed medically).  3. Nonsustained ventricular tachycardia (Medtronic Atlas II VR      cardioverter defibrillator).  4. Diabetes mellitus.  5. Hypertension.  6. Dyslipidemia.  7. Severe obstructive sleep apnea (Again, he does not wear his CPAP).   ALLERGIES/INTOLERANCES:  None.   MEDICATIONS:  1. Enalapril 10 mg b.i.d.  2. Aspirin 325 mg daily.  3. Protonix 40 mg daily.  4. Lipitor 80 mg at bedtime.  5. Furosemide 40 mg daily.  6. CPAP.  7. Metformin 1000 mg b.i.d.  8.  NovoLog.  9. Lantus.  10.Potassium 20 mEq daily.  11.Coreg 12.5 mg twice a day.   REVIEW OF SYSTEMS:  As stated in the HPI and otherwise negative for  other systems.   PHYSICAL EXAMINATION:  GENERAL:  The patient is in no distress.  VITAL SIGNS:  Blood pressure 159/94, heart rate 55 and regular, weight  is 236 pounds, body mass index 31.  NECK:  No jugular venous distention at 45 degrees; carotid upstroke  brisk and symmetrical; no bruits, no thyromegaly.  LYMPHATICS:  No cervical, axillary, or inguinal adenopathy.  LUNGS:  Clear to auscultation bilaterally.  BACK:  No costovertebral angle tenderness.  CHEST:  Unremarkable.  HEART:  PMI not displaced or sustained; S1 and S2 within normal limits;  no S3, no S4, no clicks, no rubs, no murmurs.  ABDOMEN:  Mildly obese; positive bowel sounds, normal in frequency and  pitch; no bruits, no rebound, no guarding, no midline pulsatile mass; no  hepatomegaly, no splenomegaly.  SKIN:  No rashes, no nodules.  EXTREMITIES:  2+ pulses throughout, no edema, no cyanosis, no clubbing.  NEUROLOGIC:  Oriented to person, place, and time; cranial nerves II-XII  grossly intact; motor grossly intact throughout.   EKG sinus rhythm, old anteroseptal infarct, T-wave inversions in the  anterior leads, unchanged from previous, leftward axis.   ASSESSMENT AND PLAN:  1. Coronary artery disease.  The patient is having no new symptoms.      No further cardiovascular testing is suggested.  He will continue      with medical management.  2. Cardiomyopathy.  His ejection fraction has been improved.  At this      point, since his fatigue in not better on the lower dose of      carvedilol, I am going to go back to 25 mg twice a day.  3. Sleep apnea.  I think this is the reason for the patient's fatigue.      I have asked him to call and see the doctor who was treating him      for this as he needs absolutely to be wearing his CPAP.  4. Nonsustained ventricular  tachycardia.  He has followup of his      defibrillator scheduled.  5. Hypertension.  Blood pressure is slightly elevated, though he says      it is not at home.  We will manage this in the context of treating      his cardiomyopathy.  6. Followup.  I will see him back in 6 months or sooner if needed.     Minus Breeding, MD, [facility]  Electronically Signed    JH/MedQ  DD: 11/28/2007  DT: 11/29/2007  Job #: [id number]   cc:   [name] Salaam. Shawna Orleans, DO

## 2010-09-05 NOTE — Assessment & Plan Note (Signed)
[facility]                          CHRONIC HEART FAILURE NOTE   AADI, RICHARDSON                     MRN:          [id number]  DATE:02/11/2007                            DOB:          [birth year]    PRIMARY CARDIOLOGIST:  Minus Breeding.   PRIMARY CARE:  Drema Pry.   ELECTROPHYSIOLOGIST:  Jolyn Nap.   Mr. Hur returns today for a followup of his congestive heart failure  which is secondary to ischemic cardiomyopathy.  I last saw Mr. Dockendorf  in September, at which time he was doing amazingly well and requested to  return to work full time which I consented to.  I also arranged for him  to have a followup echocardiogram for further evaluation.  A 2D  echocardiogram done October 17 showing an improvement in patient's  ejection fraction from 30% up to 45%.  Mr. Soscia returns today to  review echocardiogram results.  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  He states  he has been doing quite well with this, enjoys being back at work.  CBGs  have been running between 100 and 135, no other changes.  Weight has  been stable at home.  Blood pressure at home ranging between 105/67 and  135/79.  No firing from his defibrillator.   PAST MEDICAL HISTORY:  1. Congestive heart failure secondary to ischemic cardiomyopathy with      an EF previously 30% - 35%, now at 45% by echocardiogram.  2. Coronary artery disease, cardiac catheterization showing severe 3-      vessel disease.  Cardiac MRI revealed nonviable tissue in the      middle apical anterior septal walls with full thickness scarring.  3. Nonsustained v-tach status post implantation of a Medtronic Atlas      cardioverter defibrillator.  4. Diabetes.  5. Hypertension.  6. Dyslipidemia.  7. Severe obstructive sleep apnea with CPAP use.   REVIEW OF SYSTEMS:  As stated above.   CURRENT MEDICATIONS:  1. Enalapril 10 mg  b.i.d.  2. Aspirin 325.  3. Protonix 40.  4. Lipitor 80.  5. Furosemide 40.  6. CPAP nightly p.r.n.  7. Coreg 25 b.i.d.  8. Metformin 1000 b.i.d.  9. NovoLog.  10.Lantus insulin as directed.   PHYSICAL EXAMINATION:  Weight 238, blood pressure initially recorded at  144/85, manual 118/80 with a heart rate of 72.  Mr. Miao is in no  acute distress, no signs of jugular vein distention at 45 degree angle.  LUNGS:  Clear to auscultation.  CARDIOVASCULAR:  Reveals an S1 and S2, regular rate and rhythm.  ABDOMEN:  Soft, nontender and positive bowel sounds.  LOWER EXTREMITIES:  Without clubbing, cyanosis or edema.   IMPRESSION:  Congestive heart failure secondary to ischemic  cardiomyopathy without signs of volume overload.  Patient with [place]  Heart Association Class I status at this time.  Continue current  medications.  Schedule patient to follow up with Dr. Percival Spanish for a  routine Cardiology visit at the beginning of the year.  Rosanne Sack, ACNP  Electronically Signed      Shaune Pascal. Bensimhon, MD  Electronically Signed   MB/MedQ  DD: 02/11/2007  DT: 02/12/2007  Job #: [id number]
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
## 2010-09-08 NOTE — Assessment & Plan Note (Signed)
[facility]                   COUMADIN / CHRONIC HEART FAILURE CLINIC NOTE   FRIDDIE, CLOWES                     MRN:          [id number]  DATE:11/08/2005                            DOB:          03-22-1946    Mr. Kintzel returns today for further medication titration and evaluation of  congestive heart failure secondary to ischemic cardiomyopathy.  Mr. Nichter  is status post a myocardial infarction, cardiac catheterization showing  severe three  vessel disease with EF of 20%.  Mr. Molloy also received a  Medtronic Atlas defibrillator secondary to nonsustained ventricular  tachycardia during hospitalization in May 2007.  I last saw Mr. Vanderford in  June  at which time Dr. Haroldine Laws and myself reviewed patient's history,  cardiac catheterization results and scheduled patient for a stress Myoview.  Patient returned for [facility] in June.  He was found to have no new findings.  His EF was calculated at 22%, overall it was an abnormal adenosine nuclear  study with evidence of left ventricular enlargement; large prior anterior  and apical infarct and mild periinfarct ischemia in the distal inferior  wall.  We also did blood work at that visit showing normal kidney function  with a BUN of 17, creatinine 1.2 and a BNP of 444.  The patient had been  previously slightly volume overloaded.  I had him increase his diuretic for  a few days.  Today  he appears to be very stable.  He states he actually  feels good.  He has completely stopped working as he has had increased  fatigue since his hospitalization in May and ongoing shortness of breath  that has not increased in intensity.  Patient is tolerating his Coreg. He  states his diabetes has been well controlled, being followed by [name].   PAST MEDICAL HISTORY:  1.  Coronary artery disease, status post myocardial infarction,      catheterization showing severe three-vessel disease with ejection  fraction of 20%, status post cardiac magnetic resonance imaging      revealing nonviable tissue in the middle apical anterior and septal      walls with full thickness scar.  2.  Diabetes being treated with insulin.  3.  Nonsustained ventricular tachycardia status post Medtronic Atlas II VR      cardiovert defibrillator.  4.  Hypertension.  5.  Dyslipidemia.  6.  Severe ischemic cardiomyopathy with ejection fraction of 20%.   REVIEW OF SYSTEMS:  As stated above, otherwise negative.   CURRENT MEDICATIONS:  Aspirin 325, Protonix 40, Lipitor 80, Coreg 6.25  b.i.d., enalapril 40 b.i.d., Lasix 40 mg daily, Lantus insulin and Starlix.   PHYSICAL EXAMINATION:  VITAL SIGNS:  Weight 230, blood pressure initially  153/103 but (patient was upset that he had been waiting in the lobby for an  extended period of time).  Repeat blood pressure 126/84. Heart rate 78.  GENERAL:  Patient in no acute distress.  NECK:  Supple without lymphadenopathy, negative bruit, negative JVD.  CARDIOVASCULAR:  S1 and S2, regular rate and rhythm.  LUNGS:  Clear to auscultation bilaterally.  ABDOMEN:  Soft and nontender, positive bowel  sounds.  LOWER EXTREMITIES:  Without any clubbing, cyanosis or edema.   IMPRESSION:  Patient was stable, class 2 heart failure secondary to ischemic  cardiomyopathy.  We will increase his Coreg to 12.5 mg b.i.d.  Today I will  have patient titrate his evening dose up for the next three nights to make  sure he is  going to tolerate it and then increase his morning dose also.  I  will see patient back in four weeks for further evaluation.  It had  been  mentioned previously by Dr. Haroldine Laws for possible CPX test if patient's  stress test was negative.  At this time I will continue to titrate Coreg.  I  have spoken with Dr. Luan Pulling concerning patient's coronary disease.  We will  hold off on CPX testing at this time until patient is on full dose Coreg and  then reevaluate.                                  Rosanne Sack, ACNP    MB/MedQ  DD:  11/08/2005  DT:  11/08/2005  Job #:  [id number]

## 2010-09-08 NOTE — Assessment & Plan Note (Signed)
[facility]                          CHRONIC HEART FAILURE NOTE   TEHRAN, REALI                     MRN:          [id number]  DATE:07/11/2006                            DOB:          06/29/1945    PRIMARY CARDIOLOGIST:  Minus Breeding, M.D.,  [facility]   PRIMARY CARE PHYSICIAN:  Sandy Salaam. Shawna Orleans, D.O.   ELECTROPHYSIOLOGIST:  Deboraha Sprang, M.D., [facility]   REASON FOR VISIT:  [name] [name] returns today for follow up of his  congestive heart failure, which is secondary to ischemic cardiomyopathy.  [name] [name] states he has been doing quite well.  He recently saw Dr.  Shawna Orleans for his routine primary care visit at which time lab work was  ordered.  The patient states he is to follow up with [name] regarding  the results of his blood work.  On review of blood work ordered by Dr.  Shawna Orleans the patient's potassium was 3.8, BUN and creatinine 19 and 1.2  respectively.  The patient's hemoglobin A-1-C was 9.4.  BNP 62.  Liver  function was within normal limits.   [name] [name] states he has had no symptoms suggestive of volume overload.  He continues to walk one mile a day.  He tried to follow his diet  closely; however, his CBGs are still running in the 140s in the morning.  He denies any orthopnea, PND and palpitations; and, states overall his  quality of life has improved.   I repeated his echocardiogram in January of this year that showed  improvement in his ejection fraction from 20% to 30-35%.  The patient is  tolerating medications without problems.   PAST MEDICAL HISTORY:  The past medical history includes:  1. Congestive heart failure secondary to ischemic cardiomyopathy with      the EF currently improved to 30-35%.  2. Coronary artery disease status post out of hospital MI.  Cardiac      catheterization showed severe three-vessel disease.  Status post      cardiac MRI revealing nonviable tissue in the middle, apical,      anterior  and septal walls with  full-thickness scarring.  3. Nonsustained V-tach status post implantation of a Medtronic Atlas 2      VR cardioverter defibrillator.  4. Diabetes with recent elevations in CBGs and an elevated hemoglobin      A-1-C.  5. Hypertension.  6. Dyslipidemia.  7. Severe obstructive sleep apnea with poor tolerance to CPAP.   REVIEW OF SYSTEMS:  The review of systems is as stated above.   MEDICATIONS:  Current medications include:  1. Aspirin 325 mg.  2. Protonix 40 mg.  3. Lipitor 80 mg.  4. Enalapril 2.5 mg twice a day.  5. Furosemide 40 daily.  6. Lantus insulin 18 units at bedtime.  7. CPAP at bedtime.  8. Starlix 120 mg twice a day; questionable frequency on this dose.  9. Coreg 25 mg twice a day.   PHYSICAL EXAMINATION:  VITAL SIGNS:  Weight today is 244 pounds.  Blood  pressure is elevated at 156/90; repeat blood pressure manually 132/84  with a heart rate of 64.  GENERAL APPEARANCE:  [name] [name] is in no acute distress; and, is a very  pleasant and cooperative gentleman.  LUNGS:  The lungs are clear to auscultation bilaterally.  HEART:  Cardiovascular exam reveals an S1 and S2 with regular rate and  rhythm.  ABDOMEN:  The abdomen is soft and nontender with positive bowel sounds.  EXTREMITIES:  The lower extremities are without clubbing, cyanosis or  edema.  NEUROLOGIC EXAMINATION:  Neurologically the patient is alert and  oriented times three.   IMPRESSION:  Stable Class 2 heart failure at this time; patient mildly  hypertensive.   I am going to have him increase his enalapril to 5 mg twice a day.  We  will repeat blood work in one week to check kidney function. The patient  is to follow up with [name] regarding the results o his recent  laboratory work.  Otherwise he is to continue his current medications as  the patient has had no problems from his defibrillator and is able to  walk one mile daily without compromise.      Rosanne Sack, ACNP  Electronically  Signed      Minus Breeding, MD, [facility]  Electronically Signed   MB/MedQ  DD: 07/11/2006  DT: 07/11/2006  Job #: [id number]

## 2010-09-08 NOTE — Discharge Summary (Signed)
[name], [name] [name]              ACCOUNT NO.:  [id number]   MEDICAL RECORD NO.:  [id number]          PATIENT TYPE:  INP   LOCATION:  2020                         FACILITY:  [facility]   PHYSICIAN:  Minus Breeding, M.D.   DATE OF BIRTH:  11/04/1945   DATE OF ADMISSION:  08/31/2005  DATE OF DISCHARGE:  09/08/2005                                 DISCHARGE SUMMARY   ADDENDUM:  Additional  procedure not mentioned in the regular discharge summary.   PROCEDURE:  1.  On Sep 07, 2005, implantation of Medtronic ATLAS II VR cardioverter      defibrillator with defibrillator threshold study. The patient has had no      postprocedure complications.   He is in sinus rhythm. The device will be interrogated postprocedure day #1.  Chest x-ray will be evaluated. The incision will be checked for hematoma.  The patient is probably going home on  May 19th. He will have follow up at  [facility] in two weeks and will see Dr. Caryl Comes in two months. These  appointments have been made.      Sueanne Margarita, P.A.    ______________________________  Minus Breeding, M.D.    GM/MEDQ  D:  09/07/2005  T:  09/08/2005  Job:  [id number]   cc:   Deboraha Sprang, M.D.  [address]  [place] [address]   Robert Yoo, D.O. [facility]  [address] [place], [place] [address]

## 2010-09-08 NOTE — Assessment & Plan Note (Signed)
[name] [name]                                   ON-CALL NOTE   [name], [name] [name]                       MRN:          [id number]  DATE:11/26/2005                            DOB:          [birth year]    TELEPHONE ON CALL NOTE   PRIMARY CARDIOLOGIST:  Minus Breeding, MD, [facility]   PROBLEM:  [name] [name] contacted me this evening to tell me that he has run  out of his Coreg.  I reviewed his recent office note and noted that contrary  to what is reported, the patient was not taking 6.25 mg b.i.d.  He was  taking 3.125 mg b.i.d. and did double it up as instructed by Rosanne Sack, ACNP.  He will have taken his last dose of Coreg this evening.  He  also pointed out to me that he did contact our office this past Friday to  apprise them of the fact that he was running out of his Coreg.  However, the  pharmacy had not yet received a call from our office.   I reassured [name] [name] that I will call in a prescription for his Coreg  this evening and it will be at his current dose of 6.25 b.i.d.  He is to  remain on this dose until his scheduled followup in our office on August 31.  I also advised him to clarify with our staff as to the exact amount of the  medications that he is taking, so as to avoid such discrepancies in the  future.                                   Gene Serpe, PA-C                                Minus Breeding, MD, [facility]   GS/MedQ  DD:  11/26/2005  DT:  11/27/2005  Job #:  [id number]

## 2010-09-08 NOTE — Assessment & Plan Note (Signed)
[facility]                          CHRONIC HEART FAILURE NOTE   [name] [name], [name] [name]                     MRN:          [id number]  DATE:05/03/2006                            DOB:          [birth year]    PRIMARY CARDIOLOGIST:  Minus Breeding, M.D.   PRIMARY CARE:  Sandy Salaam. Shawna Orleans, D.O.   ELECTROPHYSIOLOGIST:  Deboraha Sprang, M.D.   [name] [name] returns today for followup of his congestive heart failure  which is secondary to ischemic cardiomyopathy.  [name] [name] states he  has been doing quite well.  He continues to complain of boredom.  He has  a known ejection fraction of 20% status post cardiac MR revealing  nonviable tissue in the middle, apical, anterior, and septal wall with  full-thickness scarring.  He is status post cardiac catheterization in  the setting of a myocardial infarction showing severe three-vessel  disease.  The patient underwent cardiac catheterization in May 2007  secondary to out-of-hospital myocardial infarction, possibly 2 or 3  weeks prior to presentation with volume overload.  [name] [name] also  underwent placement of a Medtronic Atlas II VR cardioverter-  defibrillator secondary to nonsustained ventricular tachycardia.  He  denies any episodes of firing from his defibrillator.  No complaints of  chest pain, peripheral edema, orthopnea, or PND.  He has known history  of obstructive sleep apnea, states he tolerates his CPAP about 80% of  the time.  He previously walked 1-2 miles daily, states he has not been  very active secondary to the increased cold and rainy weather.  He  complains of mild dyspnea on exertion if ambulating up a steep incline;  otherwise, states he feels good.   PAST MEDICAL HISTORY:  Includes:  1. Congestive heart failure secondary to ischemic cardiomyopathy with      EF of 20.  2. Coronary artery disease status post out-of-hospital MI.  Cardiac      catheterization showing severe three-vessel  disease.  Status post      cardiac MR revealing nonviable tissue in the middle apical,      anterior and septal wall with full-thickness scarring.  3. Nonsustained ventricular tachycardia status post implantation of a      Medtronic Atlas II VR cardioverter-defibrillator.  4. Diabetes.  5. Hypertension.  6. Dyslipidemia.  7. Severe obstructive sleep apnea with poor tolerance to CPAP.   REVIEW OF SYSTEMS:  As stated above.   MEDICATIONS:  1. Aspirin 325.  2. Protonix 40.  3. Lipitor 80.  4. Enalapril 2.5 b.i.d.  5. Furosemide 40 mg daily.  6. Lantus insulin.  7. CPAP.  8. Starlix 120 mg b.i.d.  9. Coreg 25 mg b.i.d.   PHYSICAL EXAMINATION:  VITAL SIGNS:  Weight 241.  The patient's weight  is up 3 pounds since November.  Blood pressure 142/90, rechecked  manually 132/84 with a pulse of 64.  GENERAL:  [name] [name] is in no acute distress.  No jugular vein  distention at 45-degree angle.  LUNGS:  Clear to auscultation bilaterally.  CARDIOVASCULAR:  Reveals and S1 and S2, regular rate and rhythm.  ABDOMEN:  Soft, nontender, positive bowel sounds.  LOWER EXTREMITIES:  Without clubbing, cyanosis.  He has a trace ankle  edema to his right lower extremity.  NEUROLOGIC:  The patient is alert and oriented x3.  Cranial nerves II-  XII grossly intact.   IMPRESSION:  Stable heart failure at this time.  The patient has  tolerated Coreg titration.  He is currently maintained on 25 mg b.i.d.  It has been 6 months since we evaluated his ejection fraction.  I am  going to have him proceed with a 2-D echocardiogram.  The patient also  has not seen Dr. Percival Spanish in several months.  I would like him to follow  up with Dr. Percival Spanish for a routine cardiology visit.  I will arrange  this after we obtain his 2-D echocardiogram.      Rosanne Sack, ACNP  Electronically [place]. Bensimhon, MD  Electronically Signed   MB/MedQ  DD: 05/03/2006  DT: 05/03/2006  Job #: [id number]    cc:   Sandy Salaam. Shawna Orleans, DO

## 2010-09-08 NOTE — Assessment & Plan Note (Signed)
[facility]                   COUMADIN / CHRONIC HEART FAILURE CLINIC NOTE   [name], [name]                     MRN:          [id number]  DATE:01/22/2006                            DOB:          [birth year]    [name] returns today for further evaluation and medication titration of  his congestive heart failure which is secondary to ischemic cardiomyopathy.  [name] is status post a myocardial infarction with subsequent cardiac  catheterization, showing severe three vessel disease with EF of 20%, also  with a history of nonsustained ventricular tachycardia.  He is status post a  Medtronic Atlas II VR cardioverter defibrillator.  [name] has been  tolerating medication titration without any problems.  He does complain of  some generalized weakness and fatigue which occurs with initial titration  and then gradually eases up.  Denies any orthopnea, PND or peripheral edema.  I saw [name] back in August until which time I had sent him for a sleep  study.  Patient was found to have severe obstructive sleep apnea.  I had him  follow up with Dr. Lamonte Sakai for a pulmonary consult.  [name] has a history  of a deviated septum and been intolerant of a mask up to this time.  However, he is being fitted with a special mask this week that hopefully  will be more comfortable for him and he will be able to tolerate the CPAP  machine.  He also had blood work done yesterday through Dr. Lora Havens office  with good results.  Potassium was 3.9.  Hemoglobin A1c was 7.5.  BNP 224.   PAST MEDICAL HISTORY:  1. Congestive heart failure secondary to severe ischemic cardiomyopathy      with EF 20%.  2. Coronary artery disease status post myocardial infarction.  Cardiac      catheterization showing severe three vessel disease with EF 20%, status      post cardiac MRI revealing nonviable tissue in the middle, apical,      anterior and septal walls with full  thickness scar.  3. Nonsustained ventricular tachycardia status post Medtronic Atlas II VR      cardioverter defibrillator.  4. Diabetes.  5. Hypertension.  6. Dyslipidemia.  7. Severe obstructive sleep apnea pending mask refitting for CPAP.   REVIEW OF SYSTEMS:  As state above, otherwise negative.   CURRENT MEDICATIONS:  1. Aspirin 325 mg daily.  2. Protonix 40 mg daily.  3. Lipitor 80 mg daily.  4. Coreg 12.5 mg b.i.d.  5. Enalapril 2.5 mg b.i.d.  6. Lasix 40 mg daily.  7. Starlix 120 mg daily.  8. Lantus insulin daily.  9. Xanax b.i.d. p.r.n.   PHYSICAL EXAMINATION:  GENERAL APPEARANCE:  Patient in no acute distress.  VITAL SIGNS:  Weight 237 pounds, blood pressure 130/74 with a pulse of 69.  NECK:  No jugular venous distension at 45 degree angle.  LUNGS:  Clear to auscultation bilaterally.  CARDIOVASCULAR:  S1 and S2, regular rate and rhythm.  ABDOMEN:  Soft and nontender with positive bowel sounds.  EXTREMITIES:  Lower extremities without clubbing, cyanosis, or edema.  NEUROLOGIC:  Patient alert and oriented x3.  Cranial nerves II-XII grossly  intact.   IMPRESSION:  Patient with stable class II congestive heart failure.  I will  increase patient's Coreg to 18.75 mg daily.  Patient has been instructed to  take one and a half tablets in the a.m. and p.m. to finish up his current  bottle of medicine.  I have also asked him to decrease his furosemide to 20  mg in the a.m. and then take the additional 20 mg one half tablet in the  afternoon if his weight is increasing 2 pounds within one day.  Patient to  follow up with Dr. Lamonte Sakai concerning his obstructive sleep apnea/CPAP fitting  and I will see patient back in 4 weeks.      ______________________________  Rosanne Sack, ACNP    ______________________________  Minus Breeding, MD, [facility]   MB/MedQ  DD:  01/22/2006  DT:  01/23/2006  Job #:  [id number]

## 2010-09-08 NOTE — Assessment & Plan Note (Signed)
[facility]                               PULMONARY OFFICE NOTE   LEHMON, WORSHAM                     MRN:          [id number]  DATE:03/04/2006                            DOB:          [birth year]    SUBJECTIVE:  Mr. Drouhard is a pleasant 65 year old gentleman with recently  diagnosed obstructive sleep apnea.  At our visit on January 16, 2006 we  decided to initiate C-PAP at 13 cm of water.  He tells me that he has ramped  up his compliance and is wearing it about 80% of the night.  He often finds  that he wakes up in the early morning hours and will remove it and then go  back to sleep.  Since he has started using positive pressure, he has had  more energy during the day.  He finds that he is not napping frequently and  he is not tired with his usual daily activities.  He also feels like he has  had some improved control of his other medical issues.  He is wearing a  nasal mask and feels like the fit is adequate.   PHYSICAL EXAMINATION:  In general, this is a pleasant, well appearing man  who is no distress on room air.  His weight is 239 pounds, temperature 98.0,  blood pressure 130/82, heart rate 56, spO2 96% on room air.  HEENT:  The oropharynx is clear.  NECK:  Supple, without any lymphadenopathy or JVD.  LUNGS:  Clear to auscultation bilaterally.  HEART:  Regular rate and rhythm without murmur.  ABDOMEN:  Soft and benign.  EXTREMITIES:  Have no significant edema.   IMPRESSION:  Obstructive sleep apnea with good compliance with C-PAP at 13  cm/water and improvement in his daytime sleepiness.   PLAN:  I will continue his C-PAP at 13 cm/water using a nasal mask.  We will  consider changing him to nasal pillows if he finds that his fit is not  optimal after 6 months.  He will return to see me in 6 months to review our  progress or sooner should he have any difficulty.    ______________________________  Collene Gobble, MD    RSB/MedQ  DD: 03/04/2006  DT: 03/04/2006  Job #: [id number]   cc:   Deboraha Sprang, MD, [facility]

## 2010-09-08 NOTE — Discharge Summary (Signed)
NAMELAMONTAE, [name] NO.:  [id number]   MEDICAL RECORD NO.:  [id number]          PATIENT TYPE:  INP   LOCATION:  2020                         FACILITY:  [facility]   PHYSICIAN:  Minus Breeding, M.D.   DATE OF BIRTH:  11/04/1945   DATE OF ADMISSION:  08/31/2005  DATE OF DISCHARGE:  09/08/2005                                 DISCHARGE SUMMARY   PRINCIPAL NOTE:  This patient has no known drug allergies.   PRINCIPAL DIAGNOSES:  1.  Admitted with a three-week history of dyspnea on exertion/exertional      chest pain (crescendo angina).  2.  Remote out of hospital myocardial infarction. Trop-I studies are 1.25,      then 1.18, and 1.25.  3.  Severe three -vessel coronary artery disease with catheterization on Sep 03, 2005.  4.  Severe ischemic cardiomyopathy with an ejection fraction of 20%.      1.  Anterior/anteroapical inferior akinesis.      2.  Cardiac MRI shows a nonviable tissue in the middle, apical anterior,          and septal walls. This is a full thickness scar.      3.  Class II to III congestive heart failure.  5.  Hyperglycemia this admission (a.m. fasting glucose of 160-149-146-180).      1.  On Metformin 500 mg daily prior to admission.      2.  Start Glucotrol 5 mg daily this admission.      3.  Hemoglobin A1c this admission 8.6.  6.  Nonsustained ventricular tachycardia.      1.  Baseline electrocardiogram sinus rhythm with frequent isolated PVCs.  7.  Tissue Doppler echo inconclusive for dyssynchrony.   SECONDARY DIAGNOSES:  1.  Diabetes.  2.  Hypertension.   PROCEDURES:  1.  A 2-D echocardiogram with left ventricle mildly dilated ejection      fraction of 15% to 20%, mild mitral regurgitation, severe left      ventricular dysfunction.  2.  On Sep 03, 2005, cardiac catheterization. The LAD had a 99% ostial      stenosis with TIMI-I flow. It also has a mid point 50% stenosis. The      diagonal was large and had superior branch with a long  80% stenosis. The      inferior branch had a  long 75% stenosis. The circumflex had diffuse      luminal irregularities. The ramus intermedius with moderate diffuse      disease. The obtuse marginal was large with a long proximal 80%      stenosis. The PDA was large with a proximal 25% to 30% stenosis. The      right coronary artery was small with a 99% mid point stenosis. Ejection      fraction 20% with anterior, anteroapical, and inferoapical akinesis.  3.  Cardiac MRI as dictated above showing full thickness car in the mid to      apical anterior wall which includes the septum.  4.  Tissue Doppler echocardiogram inconclusive or dyssynchrony.  BRIEF HISTORY:  [name] [name] is  a 65 year old male. He has no prior cardiac  history. He has a three to four  week history of exertional dyspnea with  associated chest tightness. This resolves at rest. The patient has had no  symptoms at rest. The patient reports that the symptoms have become  progressive. On a visit to his primary caregiver to renew medications he  mentioned this chest discomfort and dyspnea, and was promptly referred to  the emergency room. His initial electrocardiogram at [facility] is  worrisome for anteroseptal MI of unknown duration. Chest x-ray suggests mild  congestive heart failure and the initial cardiac markers were elevated at  0.88, his BNP was 106.3. The patient was admitted for close monitoring. He  was started on IV nitroglycerin, IV heparin, and Integrilin. The patient  received four baby aspirin and 40 mg of IV Lasix with good diuretic  response. Regarding exercise tolerance his wife reports that in the last  week or so the patient has had an increasingly difficult time with dyspnea  on exertion. The ultimate plan is to proceed with cardiac catheterization on  Monday, Sep 03, 2005.   HOSPITAL COURSE:  The patient presented to [facility] on May 11th  with findings worrisome for myocardial  infarction of uncertain duration. The  patient had a 2-D echocardiogram on entrant which showed increased ejection  fraction of 15% to 20% with severe left ventricular dysfunction. The patient  was maintained on IV Integrilin, heparin, and nitroglycerin with a diagnosis  of out of hospital remote myocardial infarction. He underwent left hear  catheterization on Monday May 14th. This demonstrated severe three-vessel  coronary artery disease and severely depressed left ventricular function. He  was scheduled for cardiac MRI. This was done on the same day and  demonstrated an ejection fraction 17% with nonviable anterior wall. The plan  would be optimize medications and plan for cardioverter defibrillator.  Because of this finding Dr. Caryl Comes of electrophysiology was consulted. He  recommended a tissue Doppler study to see if there was significant  dyssynchrony, even though his QRS interval was less than 120 milliseconds.  The tissue Doppler study was inconclusive for dyssynchrony and so  implantation of ICD was contemplated. This was done on May 18th. The patient  tolerated the procedure with no complications and will be scheduled for  discharge the following day, May 19th. In addition the patient has had  frequent isolated PVCs on a background of sinus rhythm. There is some  thought that going forward he may need medical therapy for this.  Additionally, [name] [name]'s hemoglobin A1c was elevated despite metformin  being taken as an outpatient. His fasting serum glucose values were elevated  despite being placed on Glucotrol 5 mg daily. He will follow up with [name]  for this. [name] [name] discharges on the 19th. He is to keep his incision dry  for the next seven days, to sponge bathe for the next seven days until May  25th.   He goes on the following medications:  1.  Enteric-coated aspirin 325 mg daily.  2.  Protonix 40 mg daily. 3.  Lipitor 80 mg daily at bedtime.  4.  Coreg 3.125 mg  b.i.d.  5.  Enalapril 2.5 mg b.i.d.  6.  Furosemide 40 mg daily.  7.  Glucotrol 5 mg daily.  8.  Xanax 0.25 mg b.i.d. as needed.   FOLLOWUP:  1.  [name], [facility], [facility], on  Monday, May 21st at 10:00.  2.  He has a post catheterization visit with [facility], [address] on Tuesday, May 29th at 9:00.  3.  He will see Dr. Percival Spanish on Thursday, May 31st at 2:30. He presents to      the ICD clinic on Wednesday, June 6th at 9:00.  4.  He will see Dr. Caryl Comes on Tuesday, August 14th at 2:40.   LABORATORY STUDIES:  BNP on May 13th was 1137. TSH  this admission was  0.701. Complete blood count on May 16th revealed hemoglobin of 13.3,  hematocrit 39.5, white count 9.1, platelet count 218,000. Serum electrolytes  on May 16th revealed sodium of 140, potassium 3.9, chloride 105, bicarbonate  26, BUN 23, creatinine 1.3, glucose 180.      Sueanne Margarita, P.A.    ______________________________  Minus Breeding, M.D.    GM/MEDQ  D:  09/07/2005  T:  09/08/2005  Job:  [id number]   cc:   Minus Breeding, M.D.  [address]  [address]  [place]  [place] [address]   Deboraha Sprang, M.D.  [address]  [place] [address]   Robert Yoo, D.O. [facility]  [address] [place], [place] [address]

## 2010-09-08 NOTE — Cardiovascular Report (Signed)
NAMENARYAN, PHANEUF NO.:  [id number]   MEDICAL RECORD NO.:  [id number]          PATIENT TYPE:  INP   LOCATION:  2908                         FACILITY:  [facility]   PHYSICIAN:  Minus Breeding, M.D.   DATE OF BIRTH:  11/04/1945   DATE OF PROCEDURE:  09/03/2005  DATE OF DISCHARGE:                              CARDIAC CATHETERIZATION   PRIMARY CARE PHYSICIAN:  [facility].   PROCEDURE:  Left heart catheterization/coronary arteriography.   INDICATIONS:  Patient with an ischemic cardiomyopathy newly diagnosed. It is  clear that he had an out of hospital myocardial infarction possibly two or  three weeks ago. He presented with volume overload.   PROCEDURE NOTE:  Left heart catheterization performed via the right femoral  artery. Right heart catheterization performed via the right femoral vein.  Both vessels were cannulated using anterior wall puncture. A #6-French  sheath and a #7-French venous sheath were inserted via the modified  Seldinger technique. A preformed Judkins' and a pigtail catheter were  utilized. The patient tolerated the procedure well and left the laboratory  in stable condition.   RESULTS:   HEMODYNAMICS:  RA mean 11, RV 45/9, PA 42/25 with a mean of 34. Pulmonary  capillary wedge pressure mean of 29. Coronaries--the left main was normal.  The LAD had an ostial 99% stenosis. There was TIMI-I flow in this large  vessel that wrapped the apex. There was a mid 50% stenosis. The first  diagonal was large and branching. The superior branch had a long 80%  stenosis. The inferior branch had a long 75% stenosis. The circumflex in the  AV groove was a dominant vessel. It had diffuse luminal irregularities in  the AV groove. The ramus intermedius was small to moderate size with diffuse  disease. The mid obtuse marginal was large with a long proximal 80%  stenosis. Posterolateral times two was small and normal. The PDA was large  with a proximal  to 25% to 30% stenosis. The right coronary artery is a small  nondominant vessel with a mid 99% stenosis.   LEFT VENTRICULOGRAM:  The left ventriculogram was obtained in the RAO  projection. The EF was 20% with anterior, anteroapical, and inferoapical  akinesis.   CONCLUSION:  Severe three-vessel coronary artery disease. Severe left  ventricular dysfunction. Out of hospital myocardial infarction.   PLAN:  At this point I will get an MRI to assess viability to make decisions  about revascularization.           ______________________________  Minus Breeding, M.D.     JH/MEDQ  D:  09/03/2005  T:  09/03/2005  Job:  [id number]

## 2010-09-08 NOTE — Procedures (Signed)
NAMEJAMSE, [name] [name] NO.:  [id number]   MEDICAL RECORD NO.:  [id number]          PATIENT TYPE:  OUT   LOCATION:  SLEEP CENTER                 [facility]ACILITY[facility]  [facility]   PHYSICIAN:  Kathee Delton, MD,FCCPDATE OF BIRTH:  05/23/1945   DATE OF STUDY:  12/06/2005                              NOCTURNAL POLYSOMNOGRAM    .  REFERRING PHYSICIAN:  Deboraha Sprang, MD   INDICATIONS:  Hypersomnia with sleep apnea.   EPWORTH SCORE:  14.   SLEEP ARCHITECTURE:  The the patient had a total sleep time of 288 minutes  with significantly decreased REM and never achieved slow wave sleep.  Sleep  onset latency was normal; and REM onset was prolonged at 224 minutes.  Sleep  efficiency was 68%.   RESPIRATORY DATA:  The patient underwent split night protocol where he was  found to have 109 obstructive events in the first 122 minutes of sleep.  This gave the patient a respiratory disturbance index over that time period  of 54 events per hour.  Severe snoring was noted during this time.  By  protocol the patient was then placed on a medium ResMed Ultram Mirage full  face mask and ultimately titrated to a final CPAP pressure of 13 cm of water  with excellent response.   OXYGEN DATA:  There was O2 desaturation as low as 91% with the patient's  obstructive events.   CARDIAC DATA:  No clinically significant cardiac arrhythmias.   MOVEMENT/PARASOMNIA:  There were no significant leg jerks or abnormal  behaviors noted.   IMPRESSION/RECOMMENDATIONS:  Severe obstructive sleep apnea/hypopnea  syndrome with a respiratory disturbance index of 54 events per hour and O2  desaturation as low as 91% during the first half of the night.  The patient  was then placed on a medium ResMed Ultram Mirage full face mask and  ultimately titrated to a final pressure of 13 cm of water.          ______________________________  Kathee Delton, MD,FCCP  [place], American Board of Sleep  Medicine    [facility]/MEDQ  D:  12/11/2005 16:02:32  T:  12/12/2005 10:18:55  Job:  [id number]

## 2010-09-08 NOTE — Op Note (Signed)
[name] [name], GEAN NO.:  [id number]   MEDICAL RECORD NO.:  [id number]          PATIENT TYPE:  INP   LOCATION:  2020                         FACILITY:  [facility]   PHYSICIAN:  Deboraha Sprang, M.D.  DATE OF BIRTH:  11/04/1945   DATE OF PROCEDURE:  09/07/2005  DATE OF DISCHARGE:                                 OPERATIVE REPORT   PROCEDURE:  Single chamber defibrillator implantation with intraoperative  defibrillation threshold testing.   Following obtaining informed consent the patient was brought to the  electrophysiology laboratory and placed on the fluoroscopic table in the  supine position.  After routine prep and drape of the left upper chest  lidocaine was infiltrated in the prepectoral subclavicular region and  incision was made and carried down to a layer of the prepectoral fascia  using electrocautery and sharp dissection.  A pocket was formed similarly.  Hemostasis was obtained.   Thereafter, attention was turned to gain access to the extrathoracic left  subclavian vein which was accomplished without difficulty without __________  of the artery.  A single venipuncture was accomplished.  A 7-French sheath  was placed and through this was then passed a [employer] Riata ST 7001 65 cm  active fixation dual coil defibrillator lead serial [id number].  Under  fluoroscopic guidance it was manipulated to the right ventricular apex where  the bipolar R-wave was 16.4 millivolts with a pacing impedance of 776 ohms  of threshold of 0.6 volts at 0.5 milliseconds, chronic threshold was 0.6 MA.  There was no diaphragmatic pacing at 10 volts.   This lead was secured in the prepectoral fascia and then attached to an  [place] ICD serial [id number].  Through the device the bipolar R-wave was  12 millivolts with a pacing impedance of 630 ohms, a pacing threshold of 0.2  volts at 0.5 milliseconds and a high voltage impedance was 35 ohms.   At this point ventricular  tachycardia was induced via the D-wave shock to  assess defibrillation thresholds.  After a total duration of 6 seconds a 20-  joule shock was delivered through a measured resistance of 36 ohms failing  to terminate ventricular fibrillation.  A 25-joule shock was then delivered  after a total duration of 13 seconds terminating ventricular fibrillation,  restoring sinus rhythm.   After a wait of five to six minutes ventricular fibrillation was re-induced  via the T-wave shock.  After a total duration of 7 seconds a 25-joule shock  was delivered through a measured resistance of 35 ohms terminating the  ventricular fibrillation and restoring sinus rhythm.  At this point the  device was implanted.  The pocket was copiously irrigated with antibiotic-  containing solution.  Hemostasis was assured and the lead and the pulse  generator were placed in the pocket, secured to the prepectoral fascia.  The  wound was closed in three layers in the normal fashion.  The wound was  washed, dried, and Benzoin Steri-Strip was applied.  Needle count, sponge  counts, and instrument counts were correct at the end of the procedure  according to the  staff.  The patient tolerated the procedure without  apparent complication.           ______________________________  Deboraha Sprang, M.D.    SCK/MEDQ  D:  09/07/2005  T:  09/07/2005  Job:  [id number]
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
## 2010-09-08 NOTE — Assessment & Plan Note (Signed)
[place]                   COUMADIN / CHRONIC HEART FAILURE CLINIC NOTE   [name] [name], [name] [name]                     MRN:          [id number]  DATE:12/21/2005                            DOB:          February 05, 1946    [name] returns today for further evaluation and medication titration  with congestive heart failure secondary to ischemic cardiomyopathy.  He is  status post myocardial infarction, subsequent cardiac catheterization  showing severe three-vessel disease with an ejection fraction of 20%, also  with a history of nonsustained ventricular tachycardia, status post  Medtronic Atlas II VR cardioverter-defibrillator.  [name] is able to  walk half a mile a day without becoming dyspneic.  He does complain of  increased shortness of breath if he attempts to climb a hill.  He,  unfortunately, was a man who was used to doing a lot of physical exertion.  He states he has become quite bored with his physical limitations  secondary to his heart disease.  He denies any orthopnea, PND, presyncope,  syncope, any firing of his defibrillator or chest discomfort.   PAST MEDICAL HISTORY:  1. Congestive heart failure secondary to severe ischemic cardiomyopathy      with an EF of 20%.  2. Coronary artery disease, status post myocardial infarction, cardiac      catheterization showing severe three-vessel disease with an EF of 20%.      Status post cardiac magnetic resonance imaging revealing nonviable      tissue in the middle, apical, anterior and septal walls with full-      thickness scar.  3. Nonsustained ventricular tachycardia, status post Medtronic Atlas II VR      cardioverter-defibrillator.  4. Diabetes being treated with insulin.  5. Hypertension.  6. Dyslipidemia.  7. Severe obstructive sleep apnea, status post a recent sleep study.   REVIEW OF SYSTEMS:  As stated above in the history of present illness,  otherwise negative.   CURRENT  MEDICATIONS:  1. Aspirin 325 mg daily.  2. Protonix 40 mg daily.  3. Lasix 40 mg daily.  4. Insulin.  5. Starlix.  6. Enalapril 2.5 mg b.i.d.  7. Coreg 12.5 mg b.i.d.   PHYSICAL EXAMINATION:  VITAL SIGNS:  Weight 236 pounds, blood pressure  133/81, with a pulse of 64.  GENERAL:  Patient in no acute distress.  NECK:  Neck veins supple without lymphadenopathy.  Negative jugular vein  distention.  LUNGS:  Clear to auscultation bilaterally.  CARDIOVASCULAR:  S1, S2, regular rate and rhythm.  ABDOMEN:  Soft, nontender, positive bowel sounds.  EXTREMITIES: Lower extremities without clubbing, cyanosis, or edema.  NEUROLOGIC:  Patient alert and oriented x3.  Cranial nerves II-XII grossly  intact.   IMPRESSION:  1. Stable class II congestive heart failure with recent titration of Coreg      to 12.5 mg by Dr. Caryl Comes.  Apparently patient in the past has      misunderstood and has not titrated his Coreg up as asked by myself.  We      will maintain him on the 12.5 mg b.i.d. as he has complained of  increased fatigue and generalized weakness.  2. Severe obstructive sleep apnea, status post sleep study.  I am going to      have the patient follow up with Dr. Lamonte Sakai for a pulmonary consultation      regarding this.   Otherwise, continue current medications.  Will follow up after the patient  has seen Pulmonary.                                 Rosanne Sack, ACNP                            Minus Breeding, MD, [facility]   MB/MedQ  DD:  12/21/2005  DT:  12/22/2005  Job #:  [id number]

## 2010-09-08 NOTE — Assessment & Plan Note (Signed)
[place]                           ELECTROPHYSIOLOGY OFFICE NOTE   DESTINE, BRONAUGH                     MRN:          [id number]  DATE:12/04/2005                            DOB:          1945/10/14    [name] [name] is seen followed ICD implantation for primary prevention  secondary to ischemic heart disease and persistent left ventricular  dysfunction.  He has Class II heart failure.  He underwent TDI imaging that  was read as abnormal but I must have looked at it myself and not undertaken  CRT because of either symptoms or the lack of dyssynchrony on red  light/green light imaging.   His exercise capacity remains quite impaired though his wife says he is  getting somewhat stronger.  He is not quite sure.   MEDICATIONS:  1. Notable for Coreg not being at 12.5 but rather 6.25 b.i.d.  2. Enalapril is 2.5 b.i.d. although the chart says it was previously 40 mg      b.i.d.   PHYSICAL EXAMINATION:  VITAL SIGNS:  Blood pressure 116/71, pulse 70.  LUNGS:  Clear.  HEART:  Heart sounds were regular.  EXTREMITIES:  Without edema.   Interrogation of his [employer] [phone] ICD demonstrates an R wave of 12 with  impedance of 520 and threshold which I do not see recorded.  Battery voltage  is 3.2.  There are no intercurrent episodes. The device was reprogrammed to  2.5 volts so the threshold must have been relatively normal.  In fact, the  threshold is 0.75 at 0.5.   IMPRESSION:  1. Ischemic cardiomyopathy without __________ myocardial infarction.  2. Status post ICD for primary bridging.  3. Class II-III congestive heart failure.  4. Obstructive sleep apnea.   Will plan to up titrate his Coreg to 12.5 mg b.i.d. in anticipation of  seeing Dr. Percival Spanish in two weeks.  Will also arrange for him to have a sleep  study for sleep apnea and to begin cardiac rehabilitation.  Will see him  again in 12 months.                                  Deboraha Sprang, MD, [facility]   SCK/MedQ  DD:  12/04/2005  DT:  12/05/2005  Job #:  [id number]   cc:   Sandy Salaam. Shawna Orleans, DO

## 2010-09-08 NOTE — Assessment & Plan Note (Signed)
[facility]                            CHRONIC HEART FAILURE NOTE   [name] [name], VILL                     MRN:          [id number]  DATE:03/13/2006                            DOB:          October 25, 1945    PRIMARY CARDIOLOGIST:  Dr. Minus Breeding.   PRIMARY CARE PHYSICIAN:  Dr. Drema Pry.   EP:  Dr. Jolyn Nap.   [name] [name] returns today for further evaluation and medication titration of  his congestive heart failure which is secondary to ischemic cardiomyopathy.  [name] [name] has been doing quite well.  He denies any problems recently.  No  chest discomfort, orthopnea, PND, presyncope or syncopal episodes.  He  states he recently saw [name] who increased his insulin dose and also  increased his Starlix.  [name] [name] has had no problems with his  medications, tolerating Coreg without lightheadedness or dizziness, no  episodes of firing from his defibrillator.  He recently followed with Dr.  Lamonte Sakai for a pulmonary consult for questionable obstructive sleep apnea.  The  patient was placed on a C-PAP.  He states he can tolerate this for about 4  hours a night but then he wakes up and has to remove it or he is not able to  rest after that.  I instructed him to follow up with Dr. Lamonte Sakai for further  evaluation.   PAST MEDICAL HISTORY:  1. Includes congestive heart failure secondary to severe ischemic      cardiomyopathy with an EF of 20%.  2. Coronary artery disease status post myocardial infarction, cardiac      catheterization showing severe 3 vessel disease with an EF of 20%,      status post cardiac MR revealing nonviable tissue in the middle,      apical, anterior and septal wall with full thickness scarring.  3. Nonsustained V-Tac status post Medtronic Atlas II VR Cardiovert      defibrillator.  4. Diabetes.  5. Hypertension.  6. Dyslipidemia.  7. Severe obstructive sleep apnea with poor tolerance of C-PAP.   REVIEW OF SYSTEMS:  As  stated above in history of present illness otherwise  negative.   CURRENT MEDICATIONS:  1. Aspirin 325.  2. Protonix 40.  3. Lipitor 80.  4. Enalapril 2.5 b.i.d.  5. Furosemide 40 mg daily.  6. Lantus 18 units daily.  7. Starlix 120 mg b.i.d.  8. C-PAP machine q.h.s.  9. Coreg 18.75 mg b.i.d.   PHYSICAL EXAMINATION:  Weight is 235 pounds, blood pressure 141/81 with a  pulse of 65.  The patient is in no acute distress, no jugular venous  distention at a 45 degree angle.  LUNGS:  Clear to auscultation bilaterally.  CARDIOVASCULAR:  S1, S2, regular rate and rhythm.  ABDOMEN:  Soft, nontender, positive bowel sounds.  LOWER EXTREMITIES:  Without clubbing, cyanosis or edema.   IMPRESSION:  Stable Class II congestive heart failure.  We will plan on  increasing Coreg to 25 mg p.o. b.i.d.  The patient to continue his  furosemide 40 mg daily.  If he experiences a weight increase during the  holidays, I have asked him to take an additional 40 mg in the evening.  If  he is having to take more than 2 doses a week of additional Lasix he is to  call me for followup sooner.  If not, I will see him in 4-6 weeks.      Rosanne Sack, ACNP  Electronically Signed      Minus Breeding, MD, [facility]  Electronically Signed   MB/MedQ  DD: 03/13/2006  DT: 03/13/2006  Job #: [id number]

## 2010-09-08 NOTE — Letter (Signed)
December 27, 2006    [name]. [address] [place], [place] [address]   RE:  KENTRE, OVERMILLER  MRN:  [id number]  /  DOB:  12/20/1945   To whom it may concern:   Mr. [name]. Beamesderfer has been cleared to return to work part-time with  a 30 pound weight restriction with light exertion.  The patient is  cleared to drive a delivery truck.  If you have any questions, please do  not hesitate to call me at [phone].    Sincerely,       Rosanne Sack, ACNP  Electronically Signed      Minus Breeding, MD, [facility]  Electronically Signed   MB/MedQ  DD: 12/27/2006  DT: 12/27/2006  Job #: [id number]   CC:    Sandy Salaam. Shawna Orleans, DO

## 2010-10-02 ENCOUNTER — Other Ambulatory Visit: Payer: Self-pay | Admitting: Internal Medicine

## 2010-10-02 NOTE — Telephone Encounter (Signed)
Medication refills denied patient needs office visit

## 2010-11-03 ENCOUNTER — Encounter: Payer: Self-pay | Admitting: Internal Medicine

## 2010-12-05 ENCOUNTER — Encounter: Payer: Self-pay | Admitting: *Deleted

## 2011-04-04 ENCOUNTER — Encounter: Payer: Self-pay | Admitting: Internal Medicine

## 2011-05-21 ENCOUNTER — Encounter: Payer: Self-pay | Admitting: *Deleted

## 2011-05-31 ENCOUNTER — Encounter: Payer: Self-pay | Admitting: Internal Medicine

## 2011-05-31 ENCOUNTER — Telehealth: Payer: Self-pay | Admitting: Internal Medicine

## 2011-05-31 NOTE — Telephone Encounter (Signed)
04-04-11 na/ home number unable to leave message/sent past due letter/mt 05-31-11 na/home number/cell d/c/sent 2nd past due letter/mt

## 2011-09-06 ENCOUNTER — Inpatient Hospital Stay (HOSPITAL_COMMUNITY)
Admission: EM | Admit: 2011-09-06 | Discharge: 2011-09-07 | DRG: 638 | Disposition: A | Discharge status: Home / Self Care | Payer: Medicare Other | Source: Ambulatory Visit | Attending: Internal Medicine | Admitting: Internal Medicine

## 2011-09-06 ENCOUNTER — Encounter (HOSPITAL_COMMUNITY): Payer: Self-pay | Admitting: Emergency Medicine

## 2011-09-06 ENCOUNTER — Emergency Department (HOSPITAL_COMMUNITY): Payer: Medicare Other

## 2011-09-06 DIAGNOSIS — N179 Acute kidney failure, unspecified: Secondary | ICD-10-CM

## 2011-09-06 DIAGNOSIS — E782 Mixed hyperlipidemia: Secondary | ICD-10-CM | POA: Diagnosis present

## 2011-09-06 DIAGNOSIS — G4733 Obstructive sleep apnea (adult) (pediatric): Secondary | ICD-10-CM

## 2011-09-06 DIAGNOSIS — I219 Acute myocardial infarction, unspecified: Secondary | ICD-10-CM

## 2011-09-06 DIAGNOSIS — I5022 Chronic systolic (congestive) heart failure: Secondary | ICD-10-CM

## 2011-09-06 DIAGNOSIS — I130 Hypertensive heart and chronic kidney disease with heart failure and stage 1 through stage 4 chronic kidney disease, or unspecified chronic kidney disease: Secondary | ICD-10-CM | POA: Diagnosis present

## 2011-09-06 DIAGNOSIS — I5042 Chronic combined systolic (congestive) and diastolic (congestive) heart failure: Secondary | ICD-10-CM | POA: Diagnosis present

## 2011-09-06 DIAGNOSIS — E869 Volume depletion, unspecified: Secondary | ICD-10-CM | POA: Diagnosis present

## 2011-09-06 DIAGNOSIS — I509 Heart failure, unspecified: Secondary | ICD-10-CM | POA: Diagnosis present

## 2011-09-06 DIAGNOSIS — G4730 Sleep apnea, unspecified: Secondary | ICD-10-CM | POA: Diagnosis present

## 2011-09-06 DIAGNOSIS — N289 Disorder of kidney and ureter, unspecified: Secondary | ICD-10-CM

## 2011-09-06 DIAGNOSIS — Z87442 Personal history of urinary calculi: Secondary | ICD-10-CM

## 2011-09-06 DIAGNOSIS — E1149 Type 2 diabetes mellitus with other diabetic neurological complication: Secondary | ICD-10-CM | POA: Diagnosis present

## 2011-09-06 DIAGNOSIS — Z7982 Long term (current) use of aspirin: Secondary | ICD-10-CM

## 2011-09-06 DIAGNOSIS — R739 Hyperglycemia, unspecified: Secondary | ICD-10-CM | POA: Diagnosis present

## 2011-09-06 DIAGNOSIS — IMO0001 Reserved for inherently not codable concepts without codable children: Principal | ICD-10-CM | POA: Diagnosis present

## 2011-09-06 DIAGNOSIS — R471 Dysarthria and anarthria: Secondary | ICD-10-CM | POA: Diagnosis present

## 2011-09-06 DIAGNOSIS — E860 Dehydration: Secondary | ICD-10-CM

## 2011-09-06 DIAGNOSIS — Z95810 Presence of automatic (implantable) cardiac defibrillator: Secondary | ICD-10-CM

## 2011-09-06 DIAGNOSIS — I2589 Other forms of chronic ischemic heart disease: Secondary | ICD-10-CM | POA: Diagnosis present

## 2011-09-06 DIAGNOSIS — I255 Ischemic cardiomyopathy: Secondary | ICD-10-CM | POA: Diagnosis present

## 2011-09-06 DIAGNOSIS — I252 Old myocardial infarction: Secondary | ICD-10-CM

## 2011-09-06 DIAGNOSIS — I10 Essential (primary) hypertension: Secondary | ICD-10-CM | POA: Diagnosis present

## 2011-09-06 DIAGNOSIS — I2510 Atherosclerotic heart disease of native coronary artery without angina pectoris: Secondary | ICD-10-CM | POA: Diagnosis present

## 2011-09-06 DIAGNOSIS — Z794 Long term (current) use of insulin: Secondary | ICD-10-CM

## 2011-09-06 DIAGNOSIS — M109 Gout, unspecified: Secondary | ICD-10-CM

## 2011-09-06 DIAGNOSIS — Z79899 Other long term (current) drug therapy: Secondary | ICD-10-CM

## 2011-09-06 DIAGNOSIS — R7989 Other specified abnormal findings of blood chemistry: Secondary | ICD-10-CM

## 2011-09-06 HISTORY — DX: Polyneuropathy, unspecified: G62.9

## 2011-09-06 HISTORY — DX: Heart failure, unspecified: I50.9

## 2011-09-06 HISTORY — DX: Sleep apnea, unspecified: G47.30

## 2011-09-06 HISTORY — DX: Type 2 diabetes mellitus without complications: E11.9

## 2011-09-06 HISTORY — DX: Essential (primary) hypertension: I10

## 2011-09-06 LAB — DIFFERENTIAL
Basophils Relative: 1 % (ref 0–1)
Lymphocytes Relative: 18 % (ref 12–46)
Monocytes Absolute: 0.6 10*3/uL (ref 0.1–1.0)
Monocytes Relative: 7 % (ref 3–12)
Neutro Abs: 5.7 10*3/uL (ref 1.7–7.7)

## 2011-09-06 LAB — URINE MICROSCOPIC-ADD ON

## 2011-09-06 LAB — GLUCOSE, CAPILLARY
Glucose-Capillary: 262 mg/dL — ABNORMAL HIGH (ref 70–99)
Glucose-Capillary: 277 mg/dL — ABNORMAL HIGH (ref 70–99)
Glucose-Capillary: 139 mg/dL — ABNORMAL HIGH (ref 70–99)
Glucose-Capillary: 104 mg/dL — ABNORMAL HIGH (ref 70–99)
Glucose-Capillary: 138 mg/dL — ABNORMAL HIGH (ref 70–99)

## 2011-09-06 LAB — COMPREHENSIVE METABOLIC PANEL
BUN: 31 mg/dL — ABNORMAL HIGH (ref 6–23)
CO2: 21 mEq/L (ref 19–32)
Chloride: 100 mEq/L (ref 96–112)
Creatinine, Ser: 1.99 mg/dL — ABNORMAL HIGH (ref 0.50–1.35)
GFR calc Af Amer: 39 mL/min — ABNORMAL LOW (ref >90)
GFR calc non Af Amer: 33 mL/min — ABNORMAL LOW (ref >90)
Glucose, Bld: 538 mg/dL — ABNORMAL HIGH (ref 70–99)
Total Bilirubin: 0.3 mg/dL (ref 0.3–1.2)

## 2011-09-06 LAB — CREATININE, SERUM
Creatinine, Ser: 1.76 mg/dL — ABNORMAL HIGH (ref 0.50–1.35)
GFR calc non Af Amer: 39 mL/min — ABNORMAL LOW (ref >90)

## 2011-09-06 LAB — RAPID URINE DRUG SCREEN, HOSP PERFORMED
Barbiturates: NOT DETECTED
Tetrahydrocannabinol: NOT DETECTED

## 2011-09-06 LAB — CBC
HCT: 36 % — ABNORMAL LOW (ref 39.0–52.0)
HCT: 35 % — ABNORMAL LOW (ref 39.0–52.0)
Hemoglobin: 12.8 g/dL — ABNORMAL LOW (ref 13.0–17.0)
Hemoglobin: 12.4 g/dL — ABNORMAL LOW (ref 13.0–17.0)
MCHC: 35.6 g/dL (ref 30.0–36.0)
RBC: 4.27 MIL/uL (ref 4.22–5.81)

## 2011-09-06 LAB — CK TOTAL AND CKMB (NOT AT ARMC)
CK, MB: 3.5 ng/mL (ref 0.3–4.0)
Total CK: 85 U/L (ref 7–232)

## 2011-09-06 LAB — URINALYSIS, ROUTINE W REFLEX MICROSCOPIC
Nitrite: NEGATIVE
Protein, ur: NEGATIVE mg/dL
Specific Gravity, Urine: 1.021 (ref 1.005–1.030)
Urobilinogen, UA: 0.2 mg/dL (ref 0.0–1.0)

## 2011-09-06 LAB — APTT: aPTT: 24 seconds (ref 24–37)

## 2011-09-06 MED ORDER — DEXTROSE 50 % IV SOLN: 25.0000 mL | INTRAVENOUS | Status: DC | PRN

## 2011-09-06 MED ORDER — INSULIN ASPART 100 UNIT/ML ~~LOC~~ SOLN: 0.0000 [IU] | Freq: Three times a day (TID) | SUBCUTANEOUS | Status: DC

## 2011-09-06 MED ORDER — PANTOPRAZOLE SODIUM 40 MG PO TBEC
40.0000 mg | DELAYED_RELEASE_TABLET | Freq: Every day | ORAL | Status: DC
  Administered 2011-09-07: 40 mg via ORAL
  Filled 2011-09-06: qty 1

## 2011-09-06 MED ORDER — INSULIN GLARGINE 100 UNIT/ML ~~LOC~~ SOLN
10.0000 [IU] | Freq: Once | SUBCUTANEOUS | Status: AC
  Administered 2011-09-06: 10 [IU] via SUBCUTANEOUS

## 2011-09-06 MED ORDER — SODIUM CHLORIDE 0.9 % IV SOLN: INTRAVENOUS | Status: DC

## 2011-09-06 MED ORDER — SODIUM CHLORIDE 0.9 % IV SOLN
INTRAVENOUS | Status: DC
  Administered 2011-09-06: 4.3 [IU]/h via INTRAVENOUS
  Filled 2011-09-06: qty 1

## 2011-09-06 MED ORDER — ONDANSETRON HCL 4 MG/2ML IJ SOLN: 4.0000 mg | Freq: Four times a day (QID) | INTRAMUSCULAR | Status: DC | PRN

## 2011-09-06 MED ORDER — HYDROCODONE-ACETAMINOPHEN 5-325 MG PO TABS: 1.0000 | ORAL_TABLET | ORAL | Status: DC | PRN

## 2011-09-06 MED ORDER — ASPIRIN EC 325 MG PO TBEC
325.0000 mg | DELAYED_RELEASE_TABLET | Freq: Every day | ORAL | Status: DC
  Administered 2011-09-07: 325 mg via ORAL
  Filled 2011-09-06: qty 1

## 2011-09-06 MED ORDER — ATORVASTATIN CALCIUM 80 MG PO TABS
80.0000 mg | ORAL_TABLET | Freq: Every day | ORAL | Status: DC
  Filled 2011-09-06: qty 1

## 2011-09-06 MED ORDER — SODIUM CHLORIDE 0.9 % IV SOLN
1000.0000 mL | INTRAVENOUS | Status: DC
  Administered 2011-09-06: 1000 mL via INTRAVENOUS

## 2011-09-06 MED ORDER — POLYETHYLENE GLYCOL 3350 17 G PO PACK
17.0000 g | PACK | Freq: Every day | ORAL | Status: DC | PRN
  Filled 2011-09-06: qty 1

## 2011-09-06 MED ORDER — DEXTROSE-NACL 5-0.45 % IV SOLN
INTRAVENOUS | Status: DC
  Administered 2011-09-06: 1000 mL via INTRAVENOUS

## 2011-09-06 MED ORDER — INSULIN ASPART 100 UNIT/ML ~~LOC~~ SOLN: 3.0000 [IU] | Freq: Three times a day (TID) | SUBCUTANEOUS | Status: DC

## 2011-09-06 MED ORDER — SODIUM CHLORIDE 0.9 % IV SOLN
1000.0000 mL | Freq: Once | INTRAVENOUS | Status: AC
  Administered 2011-09-06: 1000 mL via INTRAVENOUS

## 2011-09-06 MED ORDER — HEPARIN SODIUM (PORCINE) 5000 UNIT/ML IJ SOLN
5000.0000 [IU] | Freq: Three times a day (TID) | INTRAMUSCULAR | Status: DC
  Administered 2011-09-06 – 2011-09-07 (×2): 5000 [IU] via SUBCUTANEOUS
  Filled 2011-09-06 (×5): qty 1

## 2011-09-06 MED ORDER — ONDANSETRON HCL 4 MG PO TABS: 4.0000 mg | ORAL_TABLET | Freq: Four times a day (QID) | ORAL | Status: DC | PRN

## 2011-09-06 MED ORDER — DEXTROSE-NACL 5-0.45 % IV SOLN: INTRAVENOUS | Status: DC

## 2011-09-06 MED ORDER — INSULIN ASPART 100 UNIT/ML ~~LOC~~ SOLN: 0.0000 [IU] | Freq: Every day | SUBCUTANEOUS | Status: DC

## 2011-09-06 NOTE — ED Notes (Signed)
The patient's CBG was 433. Dr. Eulis Foster was at the bedside when taken.

## 2011-09-06 NOTE — Progress Notes (Signed)
09/06/11 NSG 6829 66 year old male, admitted to 72 with hyperglycemia from home with wife.  A & O x 3, ambulatory with assist..  Plan to return home when medically stable.  IV infusing to right lateral wrist dated for today, skin intact except for old healed scab to bottom of right foot and healing abrasion to right shin, also with redness to perineal area and sacrum cleaned and applied EPC.  Pt.  High fall risk due to frequents falls at home.  Yellow arm band, red socks placed and sign outside door.  Pt. Given information guide, reviewed and signed fall risk plan and view safety video.  Plan of care to monitor and lower CBG and maintain safety.  Will continue to monitor and assist with d/c planning as pt. progresses.

## 2011-09-06 NOTE — ED Provider Notes (Signed)
History     CSN: [id number]  Arrival date & time 09/06/11  [id number]   First MD Initiated Contact with Patient 09/06/11 [phone]      Chief Complaint  Patient presents with  . Transient Ischemic Attack    (Consider location/radiation/quality/duration/timing/severity/associated sxs/prior treatment) HPI Comments: [name] [name] is a 66 y.o. Male who has had trouble walking because of balance abnormality for 3 or 4 weeks associated with that. He also has  dysarthria drooping of his mouth and confusion, for 3-4 weeks. He denies headache, weakness, nausea, vomiting. He has been able to eat without problems. He's been taking his medications regularly.  The history is provided by the patient.    Past Medical History  Diagnosis Date  . MI (myocardial infarction)   . Pacemaker   . Defibrillator activation   . Diabetes mellitus   . Hypertension   . CHF (congestive heart failure)     Past Surgical History  Procedure Date  . Pacemaker placement     History reviewed. No pertinent family history.  History  Substance Use Topics  . Smoking status: Never Smoker   . Smokeless tobacco: Not on file  . Alcohol Use: No      Review of Systems  All other systems reviewed and are negative.    Allergies  Review of patient's allergies indicates no known allergies.  Home Medications   Current Outpatient Rx  Name Route Sig Dispense Refill  . ASPIRIN 325 MG PO TBEC Oral Take 325 mg by mouth daily.    . ATORVASTATIN CALCIUM 80 MG PO TABS Oral Take 80 mg by mouth at bedtime.    . ENALAPRIL MALEATE 10 MG PO TABS Oral Take 10 mg by mouth 2 (two) times daily.    . FUROSEMIDE 40 MG PO TABS Oral Take 40 mg by mouth daily.    . INSULIN ASPART 100 UNIT/ML [place] SOLN Subcutaneous Inject 5-15 Units into the skin 2 (two) times daily before lunch and supper. 5 units at lunch and 15 units at supper    . PANTOPRAZOLE SODIUM 40 MG PO TBEC Oral Take 40 mg by mouth daily.      BP 120/74  Pulse 74   Temp(Src) 98.1 F (36.7 C) (Oral)  Resp 18  SpO2 97%  Physical Exam  Nursing note and vitals reviewed. Constitutional: He is oriented to person, place, and time. He appears well-developed and well-nourished.  HENT:  Head: Normocephalic and atraumatic.  Right Ear: External ear normal.  Left Ear: External ear normal.  Eyes: Conjunctivae and EOM are normal. Pupils are equal, round, and reactive to light.  Neck: Normal range of motion and phonation normal. Neck supple.  Cardiovascular: Normal rate, regular rhythm, normal heart sounds and intact distal pulses.   Pulmonary/Chest: Effort normal and breath sounds normal. He exhibits no bony tenderness.  Abdominal: Soft. Normal appearance. There is no tenderness.  Musculoskeletal: Normal range of motion.  Neurological: He is alert and oriented to person, place, and time. He has normal strength. No cranial nerve deficit or sensory deficit. He exhibits normal muscle tone. Coordination (Bilateral ataxia, mild; with heel to shin and finger pointing.) abnormal.       Mild dysarthria, nonspecific. No focal strength abnormality.  Skin: Skin is warm, dry and intact.  Psychiatric: He has a normal mood and affect. His behavior is normal. Judgment and thought content normal.    ED Course  Procedures (including critical care time)   Date: 09/06/2011  Rate: 87  Rhythm:  normal sinus rhythm  QRS Axis: normal  Intervals: normal  ST/T Wave abnormalities: nonspecific T wave changes  Conduction Disutrbances:none  Narrative Interpretation:   Old EKG Reviewed: unchanged      Labs Reviewed  CBC - Abnormal; Notable for the following:    Hemoglobin 12.8 (*)    HCT 36.0 (*)    All other components within normal limits  COMPREHENSIVE METABOLIC PANEL - Abnormal; Notable for the following:    Sodium 134 (*)    Glucose, Bld 538 (*)    BUN 31 (*)    Creatinine, Ser 1.99 (*)    Albumin 3.4 (*)    Alkaline Phosphatase 159 (*)    GFR calc non Af Amer 33  (*)    GFR calc Af Amer 39 (*)    All other components within normal limits  URINALYSIS, ROUTINE W REFLEX MICROSCOPIC - Abnormal; Notable for the following:    Color, Urine STRAW (*)    Glucose, UA >1000 (*)    All other components within normal limits  GLUCOSE, CAPILLARY - Abnormal; Notable for the following:    Glucose-Capillary 433 (*)    All other components within normal limits  PROTIME-INR  APTT  DIFFERENTIAL  CK TOTAL AND CKMB  TROPONIN I  URINE RAPID DRUG SCREEN (HOSP PERFORMED)  URINE MICROSCOPIC-ADD ON   Ct Head Wo Contrast  09/06/2011  *RADIOLOGY REPORT*  Clinical Data: TIA, slurred speech.  CT HEAD WITHOUT CONTRAST  Technique:  Contiguous axial images were obtained from the base of the skull through the vertex without contrast.  Comparison: None.  Findings: Patchy low density areas within the frontal lobe deep white matter, likely chronic small vessel disease.  Mild cerebral atrophy. No acute intracranial abnormality.  Specifically, no hemorrhage, hydrocephalus, mass lesion, acute infarction, or significant intracranial injury.  No acute calvarial abnormality. Visualized paranasal sinuses and mastoids clear.  Orbital soft tissues unremarkable.  IMPRESSION: No acute intracranial abnormality.  Atrophy, chronic microvascular disease.  Original Report Authenticated By: Raelyn Number, M.D.   Trended labs: Component     Latest Ref Rng 10/11/2009 12/21/2009 09/06/2011  Sodium     135 - 145 mEq/L 143  134 (L)  Potassium     3.5 - 5.1 mEq/L 3.8  4.6  Chloride     96 - 112 mEq/L 107  100  CO2     19 - 32 mEq/L 23  21  Glucose     70 - 99 mg/dL 92  538 (H)  BUN     6 - 23 mg/dL 21  31 (H)  Creat     0.50 - 1.35 mg/dL 1.32  1.99 (H)  Calcium     8.4 - 10.5 mg/dL 9.4  9.1  GFR calc Af Amer     >90 mL/min   39 (L)  GFR calc non Af Amer     >90 mL/min   33 (L)  Total Bilirubin     0.3 - 1.2 mg/dL  0.6 0.3  Alkaline Phosphatase     39 - 117 U/L  125 (H) 159 (H)  AST     0 -  37 U/L  14 13  ALT     0 - 53 U/L  13 13  Total Protein     6.0 - 8.3 g/dL  6.6 6.9  Albumin     3.5 - 5.2 g/dL  3.7 3.4 (L)     1. Dehydration   2. Hyperglycemia   3. Renal  insufficiency   4. Dysarthria       MDM  Nonspecific neurologic symptoms with hyperglycemia and dehydration. CT head  does not show a CVA or space occupying lesion. MRI could not be done because of his pacemaker/Defibrillator.   I suspected, dysarthria, and ataxia related to dehydration, which is likely secondary to his hyperglycemia is no clear cause of hypoglycemia,  no apparent infection. He is not ketotic, and I doubt DKA. Blood pressure has been normal. Patient is being admitted for stabilization.   Plan: [place], MD 09/06/11 [phone]

## 2011-09-06 NOTE — ED Notes (Signed)
MD at bedside. Will send pt upstairs when MD leaves.

## 2011-09-06 NOTE — ED Notes (Signed)
Patient transported to CT 

## 2011-09-06 NOTE — Progress Notes (Signed)
09/06/11 NSG 1850 Pt. On insulin drip and scheduled to get 10 units of lantus insulin as well as SSI.  Dr. Venetia Constable texted paged and returned called received.  Stated was ok to go ahead and give lantus insulin now and begin SSI once insulin drip d/c.  Insulin given as ordered and will continue to monitor.  Alphonzo Lemmings, RN
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
## 2011-09-06 NOTE — Progress Notes (Signed)
Notified Rogue Bussing, NP that patient is on glucostablizer/ insulin drip. Patient's last three CBG's are 139, 104, 138.  When entered patient's last CBG reading of 138, the glucostablizer told me to turn off insulin drip.  Windy Kalata that dayshift, RN gave patient lantus 10units at 1901 tonight. Rogue Bussing, NP stated to turn off insulin drip and glucostablizer now and not to give any lantus now since patient received lantus earlier. Rogue Bussing started patient on SSI ACHS. Will check CBG in one hour. Will continue to monitor patient. Ranelle Oyster, RN

## 2011-09-06 NOTE — ED Notes (Signed)
Per daughter:  Pt has been having slurred speech and difficulty walking for the past three to four weeks.  She also reports pt has been having periodic episodes of incontinence and confusion.  Pt's daughter says pt's wife says pt has been confused for the past three to four weeks "just not making sense".  Pt reports the only symptoms he has noticed were the difficulty walking and has fallen two to three times - tripping over their dogs.

## 2011-09-07 DIAGNOSIS — R7989 Other specified abnormal findings of blood chemistry: Secondary | ICD-10-CM

## 2011-09-07 DIAGNOSIS — I5022 Chronic systolic (congestive) heart failure: Secondary | ICD-10-CM

## 2011-09-07 DIAGNOSIS — N179 Acute kidney failure, unspecified: Secondary | ICD-10-CM

## 2011-09-07 DIAGNOSIS — R471 Dysarthria and anarthria: Secondary | ICD-10-CM

## 2011-09-07 LAB — GLUCOSE, CAPILLARY
Glucose-Capillary: 147 mg/dL — ABNORMAL HIGH (ref 70–99)
Glucose-Capillary: 183 mg/dL — ABNORMAL HIGH (ref 70–99)
Glucose-Capillary: 300 mg/dL — ABNORMAL HIGH (ref 70–99)

## 2011-09-07 LAB — COMPREHENSIVE METABOLIC PANEL
ALT: 12 U/L (ref 0–53)
AST: 15 U/L (ref 0–37)
Albumin: 3.1 g/dL — ABNORMAL LOW (ref 3.5–5.2)
Calcium: 8.9 mg/dL (ref 8.4–10.5)
Potassium: 3.8 mEq/L (ref 3.5–5.1)
Sodium: 139 mEq/L (ref 135–145)
Total Protein: 6.1 g/dL (ref 6.0–8.3)

## 2011-09-07 LAB — CBC
HCT: 35.5 % — ABNORMAL LOW (ref 39.0–52.0)
Hemoglobin: 12.4 g/dL — ABNORMAL LOW (ref 13.0–17.0)
MCH: 29 pg (ref 26.0–34.0)
MCHC: 34.9 g/dL (ref 30.0–36.0)
MCV: 83.1 fL (ref 78.0–100.0)

## 2011-09-07 MED ORDER — INSULIN ASPART 100 UNIT/ML ~~LOC~~ SOLN
3.0000 [IU] | Freq: Three times a day (TID) | SUBCUTANEOUS | Status: DC
  Administered 2011-09-07: 3 [IU] via SUBCUTANEOUS

## 2011-09-07 MED ORDER — FUROSEMIDE 40 MG PO TABS: 40.0000 mg | ORAL_TABLET | Freq: Every day | ORAL | Status: DC

## 2011-09-07 MED ORDER — INSULIN GLARGINE 100 UNIT/ML ~~LOC~~ SOLN: 10.0000 [IU] | Freq: Two times a day (BID) | SUBCUTANEOUS | Status: DC

## 2011-09-07 MED ORDER — INSULIN PEN STARTER KIT
1.0000 | Freq: Once | Status: AC
  Administered 2011-09-07: 1
  Filled 2011-09-07: qty 1

## 2011-09-07 MED ORDER — INSULIN ASPART 100 UNIT/ML ~~LOC~~ SOLN: 0.0000 [IU] | Freq: Every day | SUBCUTANEOUS | Status: DC

## 2011-09-07 MED ORDER — INSULIN ASPART 100 UNIT/ML ~~LOC~~ SOLN: SUBCUTANEOUS | Status: DC

## 2011-09-07 MED ORDER — LIVING WELL WITH DIABETES BOOK
Freq: Once | Status: AC
  Administered 2011-09-07: 10:00:00
  Filled 2011-09-07: qty 1

## 2011-09-07 MED ORDER — SODIUM CHLORIDE 0.9 % IV SOLN
INTRAVENOUS | Status: DC
  Administered 2011-09-07: 09:00:00 via INTRAVENOUS

## 2011-09-07 MED ORDER — INSULIN GLARGINE 100 UNIT/ML ~~LOC~~ SOLN: 20.0000 [IU] | Freq: Two times a day (BID) | SUBCUTANEOUS | Status: DC

## 2011-09-07 MED ORDER — INSULIN GLARGINE 100 UNIT/ML ~~LOC~~ SOLN: SUBCUTANEOUS | Status: DC

## 2011-09-07 MED ORDER — INSULIN ASPART 100 UNIT/ML ~~LOC~~ SOLN: 0.0000 [IU] | Freq: Three times a day (TID) | SUBCUTANEOUS | Status: DC

## 2011-09-07 MED ORDER — INSULIN ASPART 100 UNIT/ML ~~LOC~~ SOLN
0.0000 [IU] | Freq: Three times a day (TID) | SUBCUTANEOUS | Status: DC
  Administered 2011-09-07: 3 [IU] via SUBCUTANEOUS
  Administered 2011-09-07: 8 [IU] via SUBCUTANEOUS

## 2011-09-07 MED ORDER — INSULIN ASPART 100 UNIT/ML ~~LOC~~ SOLN
4.0000 [IU] | Freq: Three times a day (TID) | SUBCUTANEOUS | Status: DC
  Administered 2011-09-07 (×2): 4 [IU] via SUBCUTANEOUS

## 2011-09-07 NOTE — Care Management Note (Signed)
    Page 1 of 1   09/07/2011     2:35:40 PM   CARE MANAGEMENT NOTE 09/07/2011  Patient:  [name] [name], [name] [name]   Account Number:  [id number]  Date Initiated:  09/07/2011  Documentation initiated by:  Tomi Bamberger  Subjective/Objective Assessment:   dx dysarthria  admit as observation- lives with spouse.  pta independent.     Action/Plan:   Anticipated DC Date:  09/07/2011   Anticipated DC Plan:  HOME/SELF CARE      DC Planning Services  CM consult      Choice offered to / List presented to:             Status of service:  Completed, signed off Medicare Important Message given?   (If response is "NO", the following Medicare IM given date fields will be blank) Date Medicare IM given:   Date Additional Medicare IM given:    Discharge Disposition:  HOME/SELF CARE  Per UR Regulation:  Reviewed for med. necessity/level of care/duration of stay  If discussed at [place] of Stay Meetings, dates discussed:    Comments:  PCP [name]  09/07/11 8:38 Tomi Bamberger RN, BSN [phone] patient lives with spouse, pta independent.  Patient has medication coverage and transportation.  NCM will continue to follow for dc needs.  Patient for dc today, no needs identified.

## 2011-09-07 NOTE — Progress Notes (Signed)
[name] to be D/C'd Home per MD order.  Discussed with the patient the After Visit Summary and all questions fully answered. Escorted to hospital entrance where daughter took patient home. Blood sugar at discharge was 186.   Bonnielee Haff, RN, [facility] 09/07/2011 6:20 PM

## 2011-09-07 NOTE — Progress Notes (Signed)
09/07/11  Reviewed the insulin pen with patient. Gave return demonstration.  Emphasized the importance of rotating sites for injections.  States that he mainly uses his thighs. Suggested using his abdomen, as well.

## 2011-09-07 NOTE — Progress Notes (Signed)
09/07/11  Spoke with patient about his diabetes.  Was diagnosed about 6 years ago when he had a heart attack.  Sees [name] in [facility] as his PCP.  Has been taking Novolog insulin 5 units before lunch, and Novolog 15 units before supper.  States that his blood glucose meter was not registering his blood sugars to be "high" at home.  Given prescription form for new blood glucose meter signed by Dr. Olevia Bowens.  Ordered Living Well with Diabetes booklet, insulin starter kit for Flexpen since he has used pens in the past, consult for RD to discuss CHO counting, and asked staff RN to check patient on insulin administration and doing own CBGs.  Will follow up later today with patient before being discharged.   Harvel Ricks RN BSN

## 2011-09-07 NOTE — Progress Notes (Signed)
Patient watched diabetic videos and self administered insulin. Demonstrated good understanding of checking own blood sugar and insulin administration. Answered questions for patient and wife. Diabetes educator and dietitian both seen patient. Joslyn Hy RN, CMSRN

## 2011-09-07 NOTE — Plan of Care (Signed)
Problem: Food- and Nutrition-Related Knowledge Deficit (NB-1.1) Goal: Nutrition education Formal process to instruct or train a patient/client in a skill or to impart knowledge to help patients/clients voluntarily manage or modify food choices and eating behavior to maintain or improve health.  Outcome: Completed/Met Date Met:  09/07/11 RD consulted for DM diet education. Pt with DM for several years but denies having any formal diet education/training. RD provided pt and wife with carbohydrate counting hand out and went over types of foods with carbohydrates, servings for meals and snacks, and checking bloods sugars. Pt's wife indicated they would be making dietary changes to reduce the amount of carbohydrate at each meal. RD answered all questions.  Lab Results  Component Value Date    HGBA1C 14.5* 09/06/2011    no additional nutrition interventions at this time. Please consult if needed.   Orson Slick MARIE

## 2011-09-07 NOTE — Evaluation (Addendum)
Physical Therapy Evaluation Patient Details Name: [name] [name] MRN: [id number] DOB: May 14, 1945 Today's Date: 09/07/2011 Time: [id number] PT Time Calculation (min): 11 min  PT Assessment / Plan / Recommendation Clinical Impression  Pt adm with dysarthria.  Neg CT.  Found to be hyperglycemic.  Pt independent with mobility and no PT needs. Instructed pt and daughter that if pt and family don't feel his balance is 100% normal in next week to consider OPPT referral from primary care doctor.    PT Assessment  Patent does not need any further PT services    Follow Up Recommendations  No PT follow up (OPPT per primary care if pt not 100% normal balance in 1 week)    Barriers to Discharge        lEquipment Recommendations  None recommended by PT    Recommendations for Other Services     Frequency      Precautions / Restrictions Restrictions Weight Bearing Restrictions: No   Pertinent Vitals/Pain N/A      Mobility  Bed Mobility Bed Mobility: Supine to Sit;Sitting - Scoot to Edge of Bed Supine to Sit: 7: Independent Sitting - Scoot to [employer] of Bed: 7: Independent Transfers Transfers: Sit to Stand;Stand to Sit Sit to Stand: 7: Independent Stand to Sit: 7: Independent Ambulation/Gait Ambulation/Gait Assistance: 7: Independent Ambulation Distance (Feet): 350 Feet Assistive device: None Gait Pattern: Within Functional Limits Gait velocity: 3.03 ft/sec Stairs Assistance: 6: Modified independent (Device/Increase time) Stair Management Technique: One rail Right Number of Stairs: 3     Exercises     PT Diagnosis:    PT Problem List:   PT Treatment Interventions:     PT Goals    Visit Information  Last PT Received On: 09/07/11 Assistance Needed: +1    Subjective Data  Subjective: "My family thought I had a stroke." Patient Stated Goal: Go home   Prior Functioning  Home Living Lives With: Spouse Available Help at Discharge: Family Type of Home: House Home Access:  Stairs to enter Technical brewer of Steps: 1 Home Layout: One level Prior Function Level of Independence: Independent Able to Take Stairs?: Yes Driving: Yes Vocation: Retired Comments: 2 falls in last couple of weeks. Communication Communication: No difficulties    Cognition  Overall Cognitive Status: Appears within functional limits for tasks assessed/performed Arousal/Alertness: Awake/alert Orientation Level: Appears intact for tasks assessed Behavior During Session: [facility] for tasks performed    Extremity/Trunk Assessment Right Lower Extremity Assessment RLE ROM/Strength/Tone: Within functional levels Left Lower Extremity Assessment LLE ROM/Strength/Tone: Within functional levels   Balance High Level Balance High Level Balance Activites: Head turns;Turns High Level Balance Comments: Able to pick up object from floor independently  End of Session PT - End of Session Activity Tolerance: Patient tolerated treatment well Patient left: in bed;with call bell/phone within reach;with family/visitor present Nurse Communication: Mobility status   [name] [name] 09/07/2011, 9:12 AM  [facility] PT [phone]

## 2011-09-07 NOTE — Discharge Summary (Signed)
Physician Discharge Summary  [name] [name] [id number] DOB: Sep 04, 1945 DOA: 09/06/2011  PCP: Drema Pry, DO, DO  Admit date: 09/06/2011 Discharge date: 09/07/2011  Discharge Diagnoses:  Principal Problem:  *Dysarthria Active Problems:  Hyperglycemia  DIABETES MELLITUS, TYPE II, UNCONTROLLED  HYPERLIPIDEMIA  HYPERTENSION  CAD  COMBINED HEART FAILURE, CHRONIC Acute renal failure.   Discharge Condition: stable  Disposition:  Will follow up with PCP in 1-2 week check a b-met and restart lisinopril if tolerate it. Will also follow up on blood glucose meter measurement and titraate insulin as tolerate it  History of present illness:  66 year old man with past medical history of coronary artery disease Severe three -vessel coronary artery disease with catheterization on Sep 03, 2005. History of diabetes mellitus with a last hemoglobin A1c of 8.4 last year, gallops recently treated with steroids for gout flare, severe ischemic cardiomyopathy with an EF of 20% improved to 45% with a NYHA 3, Nonsustained ventricular tachycardia (Medtronic Atlas 2VR cardioverter defibrillator) that comes in for dysarthria and ataxia. As per wife who is a bedside she relates that he's been having the symptoms for the past 3 weeks progressively getting worst. He's also been complaining of some polyuria and polydipsia. He in the ED blood glucose was done that showed it was 488 3 were asked to admit and further evaluate.   Hospital Course:  Principal Problem:  *Dysarthria: Most likely 2/2 to Hyperglycemia. After hydration and correction of blood glucose resolved. CT head done that shows results as above.  Hyperglycemia: Was started on insulin drip then his transition to lantus plus sliding scale.  DIABETES MELLITUS, TYPE II, UNCONTROLLED Poorly controlled probably meter at home is not working properly. Will go home on lantus plus sliding scale.  Acute renal failure: Most likely secondarily to  intravascular volume depletion due to high blood glucose improved with IV fluids. Will go home off ACE inhibitor to be started by PCP.   HYPERLIPIDEMIA Continue statins.   HYPERTENSION ACE and diuretic held due to renal failure. Lasix to be started on 09/09/2011. ACE by PCP.   CAD Continue ASA.  COMBINED HEART FAILURE, CHRONIC Compensated.   Discharge Exam: Filed Vitals:   09/07/11 0700  BP: 151/84  Pulse: 80  Temp: 97.7 F (36.5 C)  Resp: 18   Filed Vitals:   09/06/11 1606 09/06/11 2005 09/07/11 0548 09/07/11 0700  BP: 144/81 131/79 148/91 151/84  Pulse: 63 70 72 80  Temp: 97.7 F (36.5 C) 98.3 F (36.8 C) 97.7 F (36.5 C) 97.7 F (36.5 C)  TempSrc: Oral Oral Oral Oral  Resp: 18 14 18 18   Height: 6\' 1"  (1.854 m)     Weight: 89.994 kg (198 lb 6.4 oz)     SpO2: 95% 98% 97% 95%   General: Alert, awake, oriented x3, in no acute distress.  HEENT: No bruits, no goiter.  Heart: Regular rate and rhythm, without murmurs, rubs, gallops.  Lungs: Good air movement, bilateral air movement.  Abdomen: Soft, nontender, nondistended, positive bowel sounds.  Neuro: Grossly intact, nonfocal.  Discharge Instructions  Discharge Orders    Future Orders Please Complete By Expires   Diet - low sodium heart healthy      Increase activity slowly        Medication List  As of 09/07/2011  8:44 AM   STOP taking these medications         enalapril 10 MG tablet         TAKE these medications  aspirin 325 MG EC tablet   Take 325 mg by mouth daily.      atorvastatin 80 MG tablet   Commonly known as: LIPITOR   Take 80 mg by mouth at bedtime.      furosemide 40 MG tablet   Commonly known as: LASIX   Take 1 tablet (40 mg total) by mouth daily.   Start taking on: 09/09/2011      insulin aspart 100 UNIT/ML injection   Commonly known as: novoLOG   For bed time:  CBG 70 - 120: 0 units  CBG 121 - 150: 0 units  CBG 151 - 200: 0 units  CBG 201 - 250: 2 units  CBG 251  - 300: 3 units  CBG 301 - 350: 4 units  CBG 351 - 400: 5 units    Correction coverage:  CBG < 70: Drink juice; CBG 70 - 120: 0 units: CBG 121 - 150: 2 units; CBG 151 - 200: 3 units; CBG 201 - 250: 5 units; CBG 251 - 300: 8 units;CBG 301 - 350: 11 units; CBG 351 - 400: 15 units; CBG > 400 Call MD      insulin glargine 100 UNIT/ML injection   Commonly known as: LANTUS   30 units bedtime.      pantoprazole 40 MG tablet   Commonly known as: PROTONIX   Take 40 mg by mouth daily.           Follow-up Information    Follow up with Drema Pry, DO in 2 weeks. (hospital follow up)    Contact information:   [place] [phone]           The results of significant diagnostics from this hospitalization (including imaging, microbiology, ancillary and laboratory) are listed below for reference.    Significant Diagnostic Studies: Ct Head Wo Contrast  09/06/2011  *RADIOLOGY REPORT*  Clinical Data: TIA, slurred speech.  CT HEAD WITHOUT CONTRAST  Technique:  Contiguous axial images were obtained from the base of the skull through the vertex without contrast.  Comparison: None.  Findings: Patchy low density areas within the frontal lobe deep white matter, likely chronic small vessel disease.  Mild cerebral atrophy. No acute intracranial abnormality.  Specifically, no hemorrhage, hydrocephalus, mass lesion, acute infarction, or significant intracranial injury.  No acute calvarial abnormality. Visualized paranasal sinuses and mastoids clear.  Orbital soft tissues unremarkable.  IMPRESSION: No acute intracranial abnormality.  Atrophy, chronic microvascular disease.  Original Report Authenticated By: Raelyn Number, M.D.    Microbiology: No results found for this or any previous visit (from the past 240 hour(s)).   Labs: Basic Metabolic Panel:  Lab [id number] 09/06/11 1650 09/06/11 0855  NA 139 -- 134*  K 3.8 -- 4.6  CL 106 -- 100  CO2  20 -- 21  GLUCOSE 234* -- 538*  BUN 24* -- 31*  CREATININE 1.74* 1.76* 1.99*  CALCIUM 8.9 -- 9.1  MG -- -- --  PHOS -- -- --   Liver Function Tests:  Lab 09/07/11 0550 09/06/11 0855  AST 15 13  ALT 12 13  ALKPHOS 101 159*  BILITOT 0.5 0.3  PROT 6.1 6.9  ALBUMIN 3.1* 3.4*   No results found for this basename: LIPASE:5,AMYLASE:5 in the last 168 hours No results found for this basename: AMMONIA:5 in the last 168 hours CBC:  Lab 09/07/11 0550 09/06/11 1650 09/06/11 0855  WBC 6.7 7.5 7.7  NEUTROABS -- -- 5.7  HGB 12.4* 12.4* 12.8*  HCT 35.5* 35.0* 36.0*  MCV 83.1 82.0 81.4  PLT 155 159 155   Cardiac Enzymes:  Lab 09/06/11 0855  CKTOTAL 85  CKMB 3.5  CKMBINDEX --  TROPONINI <0.30   BNP: No components found with this basename: POCBNP:5 CBG:  Lab 09/07/11 0802 09/07/11 0029 09/06/11 2323 09/06/11 2219 09/06/11 2111  GLUCAP 259* 147* 138* 104* 139*    Time coordinating discharge: greater than 45 minutes.  Signed:  FELIZ ORTIZ, [facility] 09/07/2011, 8:44 AM

## 2011-09-20 ENCOUNTER — Encounter: Payer: Self-pay | Admitting: Internal Medicine

## 2011-09-20 ENCOUNTER — Ambulatory Visit (INDEPENDENT_AMBULATORY_CARE_PROVIDER_SITE_OTHER): Payer: Medicare Other | Admitting: Internal Medicine

## 2011-09-20 VITALS — BP 162/100 | HR 88 | Temp 98.5°F | Wt 209.0 lb

## 2011-09-20 DIAGNOSIS — R471 Dysarthria and anarthria: Secondary | ICD-10-CM

## 2011-09-20 DIAGNOSIS — I10 Essential (primary) hypertension: Secondary | ICD-10-CM

## 2011-09-20 MED ORDER — INSULIN GLARGINE 100 UNIT/ML ~~LOC~~ SOLN: 25.0000 [IU] | Freq: Every day | SUBCUTANEOUS | Status: DC

## 2011-09-20 MED ORDER — INSULIN PEN NEEDLE 31G X 8 MM MISC: Status: DC

## 2011-09-20 MED ORDER — NEBIVOLOL HCL 5 MG PO TABS: 5.0000 mg | ORAL_TABLET | Freq: Every day | ORAL | Status: DC

## 2011-09-20 MED ORDER — INSULIN ASPART 100 UNIT/ML ~~LOC~~ SOLN: SUBCUTANEOUS | Status: DC

## 2011-09-20 NOTE — Assessment & Plan Note (Addendum)
Enalapril on hold due to the acute renal insufficiency. Start diastolic 5 mg once daily.  Hopefully his renal function will return to normal. We discussed restarting ACE inhibitor at that time.  BP: 162/100 mmHg

## 2011-09-20 NOTE — Assessment & Plan Note (Addendum)
Severe hyperglycemia secondary to noncompliance. Insulin therapy restarted. We discussed consequences of untreated type 2 diabetes. He is already showing signs of diabetic peripheral neuropathy. Reduce Lantus to 25 units at bedtime. Use 2-5 minutes before meals twice daily. Refer to diabetic educator for further mealtime insulin titration.  Lab Results  Component Value Date   HGBA1C 14.5* 09/06/2011   Lab Results  Component Value Date   CREATININE 1.74* 09/07/2011

## 2011-09-20 NOTE — Assessment & Plan Note (Signed)
66 year old with new onset dysarthria. Patient may have had a lacunar stroke. Continue aggressive risk factor management and full dose aspirin once daily.
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
## 2011-10-17 ENCOUNTER — Ambulatory Visit (INDEPENDENT_AMBULATORY_CARE_PROVIDER_SITE_OTHER): Payer: Medicare Other | Admitting: Internal Medicine

## 2011-10-17 ENCOUNTER — Encounter: Payer: Self-pay | Admitting: Internal Medicine

## 2011-10-17 VITALS — BP 132/88 | HR 64 | Temp 98.9°F | Wt 205.0 lb

## 2011-10-17 DIAGNOSIS — IMO0001 Reserved for inherently not codable concepts without codable children: Secondary | ICD-10-CM

## 2011-10-17 DIAGNOSIS — R32 Unspecified urinary incontinence: Secondary | ICD-10-CM

## 2011-10-17 DIAGNOSIS — I10 Essential (primary) hypertension: Secondary | ICD-10-CM

## 2011-10-17 DIAGNOSIS — N189 Chronic kidney disease, unspecified: Secondary | ICD-10-CM | POA: Insufficient documentation

## 2011-10-17 DIAGNOSIS — N289 Disorder of kidney and ureter, unspecified: Secondary | ICD-10-CM

## 2011-10-17 DIAGNOSIS — F980 Enuresis not due to a substance or known physiological condition: Secondary | ICD-10-CM

## 2011-10-17 DIAGNOSIS — B372 Candidiasis of skin and nail: Secondary | ICD-10-CM

## 2011-10-17 LAB — BASIC METABOLIC PANEL
Calcium: 9.2 mg/dL (ref 8.4–10.5)
GFR: 39.16 mL/min — ABNORMAL LOW (ref >60.00)
Glucose, Bld: 109 mg/dL — ABNORMAL HIGH (ref 70–99)
Potassium: 4.4 mEq/L (ref 3.5–5.1)
Sodium: 144 mEq/L (ref 135–145)

## 2011-10-17 MED ORDER — INSULIN GLARGINE 100 UNIT/ML ~~LOC~~ SOLN: 30.0000 [IU] | Freq: Every day | SUBCUTANEOUS | Status: DC

## 2011-10-17 MED ORDER — FLUCONAZOLE 100 MG PO TABS: 100.0000 mg | ORAL_TABLET | Freq: Every day | ORAL | Status: DC

## 2011-10-17 NOTE — Patient Instructions (Addendum)
Our office will contact you re: referral to urologist Do not take your lipitor while you are taking antifungal medication (diflucan - fluconazole) Please complete the following lab tests before your next follow up appointment: BMET, A1c - 250.02

## 2011-10-17 NOTE — Assessment & Plan Note (Signed)
Patient experienced acute renal insufficiency secondary to dehydration from hyperglycemia.  It should continue to improve.  Monitor BMET.  Consider restart ARB once kidney function has normalized.  Lab Results  Component Value Date   CREATININE 1.74* 09/07/2011

## 2011-10-17 NOTE — Assessment & Plan Note (Signed)
Significant improvement.  Continue same Lantus 30 units at bedtime and novolog before meals.  Paraesthesias improving. A1c before next OV.

## 2011-10-17 NOTE — Assessment & Plan Note (Signed)
Enuresis of unclear etiology.  Question overflow incontinence.  Refer to urology for further work up.

## 2011-10-17 NOTE — Assessment & Plan Note (Signed)
Treat with diflucan 100 mg once daily for 10 days.  He understands to hold his lipitor while he is taking antifungal medication due to increased risk of rhabdomyolysis.

## 2011-10-17 NOTE — Assessment & Plan Note (Signed)
Improved.  Continue Bystolic 5 mg for now. BP: 132/88 mmHg
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
## 2011-10-26 ENCOUNTER — Other Ambulatory Visit: Payer: Self-pay | Admitting: Internal Medicine

## 2011-11-02 ENCOUNTER — Other Ambulatory Visit (INDEPENDENT_AMBULATORY_CARE_PROVIDER_SITE_OTHER): Payer: Medicare Other

## 2011-11-02 DIAGNOSIS — Z0000 Encounter for general adult medical examination without abnormal findings: Secondary | ICD-10-CM

## 2011-11-02 LAB — BASIC METABOLIC PANEL
CO2: 27 mEq/L (ref 19–32)
Chloride: 108 mEq/L (ref 96–112)
Potassium: 4.1 mEq/L (ref 3.5–5.1)
Sodium: 143 mEq/L (ref 135–145)

## 2011-12-17 ENCOUNTER — Ambulatory Visit (INDEPENDENT_AMBULATORY_CARE_PROVIDER_SITE_OTHER): Payer: Medicare Other | Admitting: Internal Medicine

## 2011-12-17 ENCOUNTER — Encounter: Payer: Self-pay | Admitting: Internal Medicine

## 2011-12-17 VITALS — BP 164/94 | HR 72 | Temp 98.5°F | Wt 212.0 lb

## 2011-12-17 DIAGNOSIS — I10 Essential (primary) hypertension: Secondary | ICD-10-CM

## 2011-12-17 DIAGNOSIS — N289 Disorder of kidney and ureter, unspecified: Secondary | ICD-10-CM

## 2011-12-17 MED ORDER — NEBIVOLOL HCL 10 MG PO TABS: 10.0000 mg | ORAL_TABLET | Freq: Every day | ORAL | Status: DC

## 2011-12-17 MED ORDER — BENAZEPRIL HCL 5 MG PO TABS: 5.0000 mg | ORAL_TABLET | Freq: Every day | ORAL | Status: DC

## 2011-12-17 NOTE — Patient Instructions (Addendum)
Please complete the following lab tests before your next follow up appointment: BMET, A1c, microalbumin / cr ratio - 250.00 LFTs, Lipid panel - 272.4
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
## 2011-12-17 NOTE — Assessment & Plan Note (Signed)
Decline in renal function precipitated by uncontrolled type 2 diabetes. Patient understands the importance of avoiding additional insults to his kidney. Patient advised avoid over-the-counter NSAIDs and dehydration.

## 2011-12-17 NOTE — Assessment & Plan Note (Addendum)
Blood pressure is suboptimally controlled. Increase Bystolic dose to 10 mg. Add benazepril 5 mg once daily.  Monitor renal function.  Lab Results  Component Value Date   CREATININE 2.0* 11/02/2011

## 2011-12-18 ENCOUNTER — Other Ambulatory Visit: Payer: Self-pay | Admitting: Internal Medicine

## 2011-12-18 DIAGNOSIS — N289 Disorder of kidney and ureter, unspecified: Secondary | ICD-10-CM

## 2011-12-25 ENCOUNTER — Encounter: Payer: Self-pay | Admitting: Internal Medicine

## 2011-12-25 ENCOUNTER — Ambulatory Visit (INDEPENDENT_AMBULATORY_CARE_PROVIDER_SITE_OTHER): Payer: Medicare Other | Admitting: Internal Medicine

## 2011-12-25 ENCOUNTER — Telehealth: Payer: Self-pay

## 2011-12-25 VITALS — BP 122/78 | Temp 98.5°F | Wt 207.0 lb

## 2011-12-25 DIAGNOSIS — N320 Bladder-neck obstruction: Secondary | ICD-10-CM | POA: Insufficient documentation

## 2011-12-25 DIAGNOSIS — N139 Obstructive and reflux uropathy, unspecified: Secondary | ICD-10-CM

## 2011-12-25 DIAGNOSIS — R339 Retention of urine, unspecified: Secondary | ICD-10-CM

## 2011-12-25 DIAGNOSIS — N289 Disorder of kidney and ureter, unspecified: Secondary | ICD-10-CM

## 2011-12-25 DIAGNOSIS — R109 Unspecified abdominal pain: Secondary | ICD-10-CM

## 2011-12-25 LAB — POCT URINALYSIS DIPSTICK
Bilirubin, UA: NEGATIVE
Glucose, UA: NEGATIVE
Ketones, UA: NEGATIVE
Leukocytes, UA: NEGATIVE
Spec Grav, UA: 1.025

## 2011-12-25 MED ORDER — CIPROFLOXACIN HCL 250 MG PO TABS: 250.0000 mg | ORAL_TABLET | Freq: Two times a day (BID) | ORAL | Status: DC

## 2011-12-25 MED ORDER — TAMSULOSIN HCL 0.4 MG PO CAPS: 0.4000 mg | ORAL_CAPSULE | Freq: Every day | ORAL | Status: DC

## 2011-12-25 NOTE — Assessment & Plan Note (Signed)
Awaiting results of renal ultrasound. Patient may have hydronephrosis from urinary outlet obstruction.  Send copy of renal ultrasound to [facility].

## 2011-12-25 NOTE — Assessment & Plan Note (Signed)
66 year old white male with signs and symptoms of urinary outflow obstruction. UA positive for microscopic blood and proteinuria. Empiric treatment with Cipro 250 mg twice daily for 7 days. Also start tamsulosin 0.4 mg at bedtime. Spoke with urologist-Dr. Diona Fanti. He will arrange urologic followup with the next 24-48 hours.

## 2011-12-25 NOTE — Patient Instructions (Addendum)
Contact our office regarding your ability to urinate adequately.
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
## 2011-12-25 NOTE — Telephone Encounter (Signed)
Error

## 2011-12-26 ENCOUNTER — Ambulatory Visit
Admission: RE | Admit: 2011-12-26 | Discharge: 2011-12-26 | Disposition: A | Discharge status: Home / Self Care | Payer: Medicare HMO | Source: Ambulatory Visit | Attending: Internal Medicine | Admitting: Internal Medicine

## 2011-12-26 DIAGNOSIS — N289 Disorder of kidney and ureter, unspecified: Secondary | ICD-10-CM

## 2011-12-27 ENCOUNTER — Telehealth: Payer: Self-pay | Admitting: Internal Medicine

## 2011-12-27 NOTE — Telephone Encounter (Signed)
Hassan Rowan called and states that [name] put pt on Cipro and a urine culture was done but the results are not back yet.  Returned Brenda's call to make aware that pt had date confused for Urodynamic procedure and wanted to know if the Cipro needed to be stopped or if pt could continue medication.  Called and spoke with Hassan Rowan who states that the Cipro should not effect the patient and if it is then pt will be called directly.  Left a detailed message at pt's home number.  Advised pt to call office if he had any more questions.

## 2011-12-27 NOTE — Telephone Encounter (Signed)
Caller: Sheila/Patient; Patient Name: [name] [name]; PCP: Shawna Orleans, [facility] [place]) (Adults only); [facility] Phone Number: [phone].  Call regarding Cipro for Bladder infection.  Patient was seen by Urologist on 9-4, Foley Cath placed, Patient will have Ultra Urodynamics procedure on 8-13, Patient will run out of Cipro in 4 days.  Patient wanting to know if he should stay on Cipro until after procedure, if so, he will need refill. [place], Od[place], [phone]. Afebrile. All emergent symptoms ruled out per Urinary Symptoms, see in 72 hours due to gradual onset of difficulty controlling bladder.  PLEASE FOLLOW UP WITH DR YOO IF HE FEELS CIPRO IS NECESSARY UNTIL PROCEDURE ON 8-13.

## 2011-12-27 NOTE — Telephone Encounter (Signed)
Please call his urologist and ask if they want pt to stay on cipro

## 2011-12-27 NOTE — Telephone Encounter (Signed)
[facility] and a message has been sent to the nurse to ask about Cipro.  According to [facility] pt's Urodyanmic procedure is scheduled for 12/28/2011 at 11:30 am.    Called pt to make aware of procedure date and time and to call.  Pt states a message can be left on voicemail about cipro.

## 2011-12-27 NOTE — Telephone Encounter (Signed)
Pls advise.  

## 2011-12-28 ENCOUNTER — Telehealth: Payer: Self-pay | Admitting: Internal Medicine

## 2011-12-28 NOTE — Telephone Encounter (Signed)
Please check status of urine culture.

## 2011-12-28 NOTE — Telephone Encounter (Signed)
Caller: [name]/Patient; Patient Name: [name] [name]; PCP: Shawna Orleans, [facility] [place]) (Adults only); [facility] Phone Number: [phone] Pt is returning Cindy's call. Call transferred to office.

## 2011-12-28 NOTE — Telephone Encounter (Signed)
[facility] to get update on pt's urine culture.  Left a message with Dr. Loleta Dicker nurse requesting to have urine culture results faxed to office.

## 2011-12-28 NOTE — Progress Notes (Signed)
Quick Note:  Spoke with pt and pt is aware. ______

## 2011-12-28 NOTE — Telephone Encounter (Signed)
Pt is aware of lab results.

## 2012-01-01 ENCOUNTER — Ambulatory Visit (INDEPENDENT_AMBULATORY_CARE_PROVIDER_SITE_OTHER): Payer: Medicare HMO | Admitting: Internal Medicine

## 2012-01-01 VITALS — BP 118/72 | HR 90 | Temp 98.3°F | Wt 204.0 lb

## 2012-01-01 DIAGNOSIS — N289 Disorder of kidney and ureter, unspecified: Secondary | ICD-10-CM

## 2012-01-01 DIAGNOSIS — N139 Obstructive and reflux uropathy, unspecified: Secondary | ICD-10-CM

## 2012-01-01 DIAGNOSIS — Z23 Encounter for immunization: Secondary | ICD-10-CM

## 2012-01-01 DIAGNOSIS — N319 Neuromuscular dysfunction of bladder, unspecified: Secondary | ICD-10-CM

## 2012-01-01 NOTE — Assessment & Plan Note (Signed)
Monitor renal function.  It should improve with foley catheter for bladder outlet obstruction.

## 2012-01-01 NOTE — Assessment & Plan Note (Signed)
Improved.  Patient evaluated by his urologist Dr. Junious Silk.  Foley catheter placed. Awaiting results of urodynamic study. Suspect patient has neurogenic bladder from diabetes.  Further management as per urology.
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
## 2012-01-02 LAB — BASIC METABOLIC PANEL
CO2: 25 mEq/L (ref 19–32)
Calcium: 8.8 mg/dL (ref 8.4–10.5)
Chloride: 107 mEq/L (ref 96–112)
Glucose, Bld: 262 mg/dL — ABNORMAL HIGH (ref 70–99)
Potassium: 4.2 mEq/L (ref 3.5–5.1)
Sodium: 142 mEq/L (ref 135–145)

## 2012-01-03 NOTE — Progress Notes (Signed)
Quick Note:  Called and spoke with pt and pt is aware. Pt verbalized understanding. Labs sent to Dr. Junious Silk. ______

## 2012-01-23 ENCOUNTER — Other Ambulatory Visit: Payer: Self-pay | Admitting: Urology

## 2012-01-29 ENCOUNTER — Encounter: Payer: Self-pay | Admitting: Internal Medicine

## 2012-01-29 ENCOUNTER — Ambulatory Visit (INDEPENDENT_AMBULATORY_CARE_PROVIDER_SITE_OTHER): Payer: Medicare HMO | Admitting: Internal Medicine

## 2012-01-29 VITALS — BP 132/80 | HR 72 | Temp 98.3°F | Wt 204.0 lb

## 2012-01-29 DIAGNOSIS — E782 Mixed hyperlipidemia: Secondary | ICD-10-CM

## 2012-01-29 DIAGNOSIS — N289 Disorder of kidney and ureter, unspecified: Secondary | ICD-10-CM

## 2012-01-29 DIAGNOSIS — N139 Obstructive and reflux uropathy, unspecified: Secondary | ICD-10-CM
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
## 2012-01-29 NOTE — Assessment & Plan Note (Signed)
Monitor A1c.  Home blood sugar readings well controlled on current insulin regimen.

## 2012-01-29 NOTE — Assessment & Plan Note (Signed)
Patient seen by urology-urodynamic study showed  low capacity at 283 ML's. He has decreased compliance and leakage. He was started on oxybutynin chloride ER 10 mg once daily. He has followup renal ultrasound to ensure bilateral hydronephrosis has resolved with urology.

## 2012-01-29 NOTE — Assessment & Plan Note (Signed)
His renal function may not improve despite correcting bilateral hydronephrosis. This may be secondary to hydronephrosis and/or his previous uncontrolled diabetes.  Monitor BMET.

## 2012-01-30 LAB — BASIC METABOLIC PANEL
BUN: 19 mg/dL (ref 6–23)
CO2: 26 mEq/L (ref 19–32)
Chloride: 108 mEq/L (ref 96–112)
GFR: 44.96 mL/min — ABNORMAL LOW (ref >60.00)
Glucose, Bld: 124 mg/dL — ABNORMAL HIGH (ref 70–99)
Potassium: 4.4 mEq/L (ref 3.5–5.1)

## 2012-01-30 LAB — HEPATIC FUNCTION PANEL
ALT: 15 U/L (ref 0–53)
Bilirubin, Direct: 0.1 mg/dL (ref 0.0–0.3)
Total Bilirubin: 0.6 mg/dL (ref 0.3–1.2)

## 2012-01-30 LAB — LIPID PANEL
Total CHOL/HDL Ratio: 6
VLDL: 31.4 mg/dL (ref 0.0–40.0)

## 2012-02-05 ENCOUNTER — Encounter: Payer: Self-pay | Admitting: Physician Assistant

## 2012-02-05 ENCOUNTER — Ambulatory Visit (INDEPENDENT_AMBULATORY_CARE_PROVIDER_SITE_OTHER): Payer: Medicare HMO | Admitting: *Deleted

## 2012-02-05 ENCOUNTER — Encounter: Payer: Self-pay | Admitting: Internal Medicine

## 2012-02-05 ENCOUNTER — Ambulatory Visit (INDEPENDENT_AMBULATORY_CARE_PROVIDER_SITE_OTHER): Payer: Medicare HMO | Admitting: Physician Assistant

## 2012-02-05 VITALS — BP 153/90 | HR 70 | Ht 73.0 in | Wt 203.0 lb

## 2012-02-05 DIAGNOSIS — I10 Essential (primary) hypertension: Secondary | ICD-10-CM

## 2012-02-05 DIAGNOSIS — I428 Other cardiomyopathies: Secondary | ICD-10-CM

## 2012-02-05 DIAGNOSIS — I5042 Chronic combined systolic (congestive) and diastolic (congestive) heart failure: Secondary | ICD-10-CM

## 2012-02-05 DIAGNOSIS — I2510 Atherosclerotic heart disease of native coronary artery without angina pectoris: Secondary | ICD-10-CM

## 2012-02-05 DIAGNOSIS — Z01810 Encounter for preprocedural cardiovascular examination: Secondary | ICD-10-CM

## 2012-02-05 LAB — ICD DEVICE OBSERVATION
BATTERY VOLTAGE: 2.55 V
RV LEAD AMPLITUDE: 12.1 mv
RV LEAD THRESHOLD: 0.75 V
TOT-0007: 2
TOT-0008: 0
TOT-0009: 1
TOT-0010: 55
TZAT-0001SLOWVT: 1
TZAT-0004SLOWVT: 8
TZAT-0012SLOWVT: 200 ms
TZAT-0013FASTVT: 1
TZAT-0013SLOWVT: 3
TZAT-0018FASTVT: NEGATIVE
TZAT-0019FASTVT: 7.5 V
TZAT-0020FASTVT: 1 ms
TZAT-0020SLOWVT: 1 ms
TZON-0003FASTVT: 300 ms
TZON-0004FASTVT: 12
TZON-0004SLOWVT: 12
TZON-0005FASTVT: 6
TZON-0005SLOWVT: 6
TZON-0008FASTVT: 50 ms
TZON-0010FASTVT: 80 ms
TZST-0001FASTVT: 2
TZST-0001FASTVT: 5
TZST-0001SLOWVT: 3
TZST-0001SLOWVT: 5
TZST-0003FASTVT: 36 J
TZST-0003FASTVT: 36 J
TZST-0003SLOWVT: 25 J
TZST-0003SLOWVT: 36 J
TZST-0003SLOWVT: 36 J

## 2012-02-05 NOTE — Progress Notes (Signed)
[place], [place]  [address] Phone: [phone] Fax:  [phone]  Date:  02/05/2012   Name:  [name] [name]   DOB:  09/10/1945   MRN:  [id number]  PCP:  Drema Pry, DO  Primary Cardiologist:  Dr. Minus Breeding  Primary Electrophysiologist:  Dr. Virl Axe    History of Present Illness: [name] [name] is a 66 y.o. male who returns for surgical clearance.  He has a hx of CAD, status post out of hospital MI in 5/07, ischemic cardiomyopathy, nonsustained ventricular tachycardia, status post AICD, CKD. Cardiac catheterization in 2007 demonstrated severe 3 vessel CAD with ostial LAD 99%, mid LAD 50%, superior D1 80%, inferior D1 75%, mid OM proximal 80%, proximal PDA 25-30%, mid RCA 99%. MRI at that time demonstrated nonviable tissue in the middle, apical anterior and septal walls-full-thickness scar. EF was 15-20% at that time. He was treated medically. Last echo 8/11: Mild LVH, EF 40-45%, apical HK, inferoseptal HK, grade 1 diastolic dysfunction, moderately dilated descending aorta, mild LAE, mild to moderate TR, PASP 35.  Last seen by Dr. Caryl Comes 6/11 and Dr. Minus Breeding in 8/11. He needs clearance for cystoscopy and bladder biopsy for suspected mass. Surgeon is Dr. Festus Aloe.    Patient was admitted in 5/13 with acute on chronic renal failure and hyperglycemia. His ACE inhibitor was held for a short period of time. He's had difficulty with urinary retention and has an indwelling catheter. He is now back on his ACE inhibitor as well as beta blocker. The patient denies significant changes in his symptoms. He is quite active. He can achieve more than 4 METs without chest discomfort or shortness of breath. He is probably class IIb. He denies orthopnea, PND or significant pedal edema. He denies syncope or ICD discharges.   Labs (10/13):  K 4.4, creatinine 1.6, ALT 15, LDL 112, TSH 0.92  Wt Readings from Last 3 Encounters:  02/05/12 203 lb (92.08  kg)  01/29/12 204 lb (92.534 kg)  01/01/12 204 lb (92.534 kg)     Past Medical History  Diagnosis Date  . Hypertension   . CHF (congestive heart failure)   . MI (myocardial infarction) 2007  . ICD (implantable cardiac defibrillator) in place 2007  . Pacemaker 2007  . Peripheral neuropathy   . Sleep apnea 09/06/11    "has CPAP; won't use it"  . Type II diabetes mellitus 2005  . Gout     Current Outpatient Prescriptions  Medication Sig Dispense Refill  . aspirin 325 MG EC tablet Take 325 mg by mouth daily.      Marland Kitchen atorvastatin (LIPITOR) 80 MG tablet Take 80 mg by mouth at bedtime.      . benazepril (LOTENSIN) 5 MG tablet Take 1 tablet (5 mg total) by mouth daily.  30 tablet  2  . cyanocobalamin 1000 MCG tablet Take 100 mcg by mouth daily.      . furosemide (LASIX) 40 MG tablet Take 20 mg by mouth daily.      . insulin aspart (NOVOLOG) 100 UNIT/ML injection Use 2-7 units before meals twice daily      . insulin glargine (LANTUS SOLOSTAR) 100 UNIT/ML injection Inject 30 Units into the skin at bedtime.  5 pen  5  . Insulin Pen Needle 31G X 8 MM MISC Use 3 x daily as directed  100 each  11  . nebivolol (BYSTOLIC) 10 MG tablet Take 1 tablet (10 mg total) by mouth daily.  30 tablet  5  . oxybutynin (DITROPAN-XL) 10 MG 24 hr tablet Take 1 tablet by mouth Twice daily.      . pantoprazole (PROTONIX) 40 MG tablet Take 40 mg by mouth daily.        Allergies: No Known Allergies  History  Substance Use Topics  . Smoking status: Never Smoker   . Smokeless tobacco: Never Used  . Alcohol Use: No     ROS:  Please see the history of present illness.     All other systems reviewed and negative.   PHYSICAL EXAM: VS:  BP 153/90  Pulse 70  Ht 6\' 1"  (1.854 m)  Wt 203 lb (92.08 kg)  BMI 26.78 kg/m2 Well nourished, well developed, in no acute distress HEENT: normal Neck: no JVD Cardiac:  normal S1, S2; RRR; no murmur Lungs:  clear to auscultation bilaterally, no wheezing, rhonchi or  rales Abd: soft, nontender, no hepatomegaly Ext: no edema Skin: warm and dry Neuro:  CNs 2-12 intact, no focal abnormalities noted  EKG:  Sinus rhythm, HR 67, LAD, septal Q waves, T-wave inversions in 1, aVL, V1 through V 5, no change from prior tracing      ASSESSMENT AND PLAN:  1. Surgical Clearance: Patient has significant coronary artery disease, ischemic cardiomyopathy and combined systolic and diastolic CHF. He has not had an assessment for ischemia since his cardiac catheterization 2007. His ejection fraction has improved over time. He is quite active with minimal symptoms. I did discuss his case today with Dr. Percival Spanish. Given his significant CAD, we have recommended proceeding with a Lexiscan Myoview prior to clearing him for surgery. As long as this is low risk, he will need no further workup. Given his prior CAD, is clear that his Myoview will be somewhat abnormal. However, we will be looking for significant defects.  2. Coronary Artery Disease: No angina. Continue aspirin. Hopefully, aspirin can be continued throughout the perioperative period.  3. Chronic Combined Systolic and Diastolic CHF: Volume stable. Close attention will need to be paid to his volume status in the perioperative period. Followup with Dr. Percival Spanish in 3-4 months.  4. Status Post AICD: His device was interrogated today. It is functioning appropriately. The device representative will need to be available during the perioperative period. Followup with Dr. Caryl Comes in 3 months.  5. Chronic Kidney Disease: This is followed closely by his PCP.  6. Hypertension: Blood pressure somewhat elevated today. Consider adding hydralazine and nitrates over time.  SignedRichardson Dopp, PA-C  10:06 AM 02/05/2012

## 2012-02-05 NOTE — Progress Notes (Signed)
ICD check

## 2012-02-05 NOTE — Patient Instructions (Addendum)
Your physician has requested that you have a lexiscan myoview DX V72.81, 414.01. For further information please visit [email or web address]. Please follow instruction sheet, as given.  Your physician recommends that you schedule a follow-up appointment in: [place]  Your physician recommends that you schedule a follow-up appointment in: 3-4 MONTHS WITH DR. HOCHREIN

## 2012-02-07 ENCOUNTER — Ambulatory Visit (HOSPITAL_COMMUNITY): Payer: Medicare HMO | Attending: Physician Assistant | Admitting: Radiology

## 2012-02-07 ENCOUNTER — Encounter (HOSPITAL_COMMUNITY): Payer: Self-pay | Admitting: Pharmacy Technician

## 2012-02-07 VITALS — BP 147/92 | HR 59 | Ht 73.0 in | Wt 206.0 lb

## 2012-02-07 DIAGNOSIS — E119 Type 2 diabetes mellitus without complications: Secondary | ICD-10-CM | POA: Insufficient documentation

## 2012-02-07 DIAGNOSIS — Z01810 Encounter for preprocedural cardiovascular examination: Secondary | ICD-10-CM

## 2012-02-07 DIAGNOSIS — I10 Essential (primary) hypertension: Secondary | ICD-10-CM | POA: Insufficient documentation

## 2012-02-07 DIAGNOSIS — I2510 Atherosclerotic heart disease of native coronary artery without angina pectoris: Secondary | ICD-10-CM

## 2012-02-07 DIAGNOSIS — I219 Acute myocardial infarction, unspecified: Secondary | ICD-10-CM

## 2012-02-07 DIAGNOSIS — R42 Dizziness and giddiness: Secondary | ICD-10-CM | POA: Insufficient documentation

## 2012-02-07 DIAGNOSIS — Z8249 Family history of ischemic heart disease and other diseases of the circulatory system: Secondary | ICD-10-CM | POA: Insufficient documentation

## 2012-02-07 MED ORDER — REGADENOSON 0.4 MG/5ML IV SOLN
0.4000 mg | Freq: Once | INTRAVENOUS | Status: AC
  Administered 2012-02-07: 0.4 mg via INTRAVENOUS

## 2012-02-07 MED ORDER — TECHNETIUM TC 99M SESTAMIBI GENERIC - CARDIOLITE
11.0000 | Freq: Once | INTRAVENOUS | Status: AC | PRN
  Administered 2012-02-07: 11 via INTRAVENOUS

## 2012-02-07 MED ORDER — TECHNETIUM TC 99M SESTAMIBI GENERIC - CARDIOLITE
33.0000 | Freq: Once | INTRAVENOUS | Status: AC | PRN
  Administered 2012-02-07: 33 via INTRAVENOUS

## 2012-02-07 NOTE — Progress Notes (Signed)
[facility] [id number] [place] [address] [phone]  Cardiology Nuclear Med Study  [name] [name] is a 66 y.o. male     MRN : [id number]     DOB: [birth year]  Procedure Date: 02/07/2012  Nuclear Med Background Indication for Stress Test:  Evaluation for Ischemia and Clearance for Pending Urological Surgery by Dr. Junious Silk History:  5/07 MI>Cath:Severe 3V Dz=medical tx>Cardiac [id number] tissue, EF=15-20%>ICD; '11 Echo:Echo:EF=45%, mild LVH; h/o NSVT Cardiac Risk Factors: Family History - CAD, Hypertension, IDDM Type 2 and Lipids  Symptoms:  Dizziness   Nuclear Pre-Procedure Caffeine/Decaff Intake:  None NPO After: 5:30 pm   Lungs:  Clear. O2 Sat: 97% on room air. IV 0.9% NS with Angio Cath:  20g  IV Site: R Antecubital  IV Started by:  Crissie Figures, RN  Chest Size (in):  44 Cup Size: n/a  Height: 6\' 1"  (1.854 m)  Weight:  206 lb (93.441 kg)  BMI:  Body mass index is 27.18 kg/(m^2). Tech Comments:  Patient held all med's this AM BS @4 :30 AM=124    Nuclear Med Study 1 or 2 day study: 1 day  Stress Test Type:  Lexiscan  Reading MD: Jenkins Rouge, MD  Order Authorizing Provider:  Minus Breeding, MD  Resting Radionuclide: Technetium 57m Sestamibi  Resting Radionuclide Dose: 11.0 mCi   Stress Radionuclide:  Technetium 43m Sestamibi  Stress Radionuclide Dose: 33.0 mCi           Stress Protocol Rest HR: 59 Stress HR: 98  Rest BP: 147/92 Stress BP: 147/95  Exercise Time (min): n/a METS: n/a   Predicted Max HR: 155 bpm % Max HR: 63.23 bpm Rate Pressure Product: 14406   Dose of Adenosine (mg):  n/a Dose of Lexiscan: 0.4 mg  Dose of Atropine (mg): n/a Dose of Dobutamine: n/a mcg/kg/min (at max HR)  Stress Test Technologist: Letta Moynahan, CMA-N  Nuclear Technologist:  Charlton Amor, CNMT     Rest Procedure:  Myocardial perfusion imaging was performed at rest 45 minutes following the intravenous administration  of Technetium 89m Sestamibi.  Rest ECG: Prior SWMI with nonspecific T-wave changes.  Stress Procedure:  The patient received IV Lexiscan 0.4 mg over 15-seconds.  Technetium 87m Sestamibi was injected at 30-seconds.  There were no significant changes with Lexiscan, rare ventricular couplet noted.  Quantitative spect images were obtained after a 45 minute delay.  Stress ECG: No significant change from baseline ECG  QPS Raw Data Images:  Normal; no motion artifact; normal heart/lung ratio. Stress Images:  There is decreased uptake in the anterior wall. Rest Images:  There is decreased uptake in the anterior wall. Subtraction (SDS):  There is a fixed defect that is most consistent with a previous infarction. Transient Ischemic Dilatation (Normal <1.22):  1.06 Lung/Heart Ratio (Normal <0.45):  0.34  Quantitative Gated Spect Images QGS EDV:  201 ml QGS ESV:  130 ml  Impression Exercise Capacity:  Lexiscan with no exercise. BP Response:  Normal blood pressure response. Clinical Symptoms:  No significant symptoms noted. ECG Impression:  No significant ST segment change suggestive of ischemia. Comparison with Prior Nuclear Study: No images to compare  Overall Impression:  Low risk stress nuclear study.Large anteroapical wall infarct with no ischemia  LV Ejection Fraction: 36%.  LV Wall Motion:  Anteroapical wall hypokinesis   Jenkins Rouge

## 2012-02-08 ENCOUNTER — Other Ambulatory Visit (INDEPENDENT_AMBULATORY_CARE_PROVIDER_SITE_OTHER): Payer: Medicare HMO

## 2012-02-08 ENCOUNTER — Telehealth: Payer: Self-pay | Admitting: *Deleted

## 2012-02-08 DIAGNOSIS — E119 Type 2 diabetes mellitus without complications: Secondary | ICD-10-CM

## 2012-02-08 DIAGNOSIS — IMO0001 Reserved for inherently not codable concepts without codable children: Secondary | ICD-10-CM

## 2012-02-08 DIAGNOSIS — E785 Hyperlipidemia, unspecified: Secondary | ICD-10-CM

## 2012-02-08 LAB — BASIC METABOLIC PANEL
BUN: 29 mg/dL — ABNORMAL HIGH (ref 6–23)
Chloride: 109 mEq/L (ref 96–112)
GFR: 36.81 mL/min — ABNORMAL LOW (ref >60.00)
Potassium: 4.5 mEq/L (ref 3.5–5.1)

## 2012-02-08 LAB — HEMOGLOBIN A1C: Hgb A1c MFr Bld: 7.7 % — ABNORMAL HIGH (ref 4.6–6.5)

## 2012-02-08 LAB — LIPID PANEL
HDL: 30.9 mg/dL — ABNORMAL LOW (ref >39.00)
Total CHOL/HDL Ratio: 6
VLDL: 46 mg/dL — ABNORMAL HIGH (ref 0.0–40.0)

## 2012-02-08 LAB — MICROALBUMIN / CREATININE URINE RATIO
Microalb Creat Ratio: 33.2 mg/g — ABNORMAL HIGH (ref 0.0–30.0)
Microalb, Ur: 43.3 mg/dL — ABNORMAL HIGH (ref 0.0–1.9)

## 2012-02-08 LAB — HEPATIC FUNCTION PANEL
ALT: 16 U/L (ref 0–53)
AST: 16 U/L (ref 0–37)
Bilirubin, Direct: 0 mg/dL (ref 0.0–0.3)
Total Bilirubin: 0.5 mg/dL (ref 0.3–1.2)

## 2012-02-08 LAB — LDL CHOLESTEROL, DIRECT: Direct LDL: 100.5 mg/dL

## 2012-02-08 NOTE — Telephone Encounter (Signed)
Message copied by Michae Kava on Fri Feb 08, 2012  2:56 PM ------      Message from: [place], [place] T      Created: Fri Feb 08, 2012  2:03 PM       Low risk      Anteroapical scar; EF 36%      No ischemia      Ok to proceed with surgery at acceptable risk      Richardson Dopp, PA-C  2:03 PM 02/08/2012

## 2012-02-08 NOTE — Telephone Encounter (Signed)
pt notified about myoview results. pt verbalized understanding today to result. I will fax results to surgeon Dr. Festus Aloe [phone]

## 2012-02-11 ENCOUNTER — Encounter (HOSPITAL_COMMUNITY): Payer: Self-pay | Admitting: *Deleted

## 2012-02-11 NOTE — Progress Notes (Signed)
1657  EKG 02-05-12 Dr. Caryl Comes

## 2012-02-11 NOTE — Progress Notes (Deleted)
1657 EKG 02-05-12 Dr. Caryl Comes

## 2012-02-13 ENCOUNTER — Inpatient Hospital Stay (HOSPITAL_COMMUNITY): Admission: RE | Admit: 2012-02-13 | Payer: Medicare HMO | Source: Ambulatory Visit

## 2012-02-15 ENCOUNTER — Encounter (HOSPITAL_COMMUNITY): Admission: RE | Payer: Self-pay | Source: Ambulatory Visit

## 2012-02-15 ENCOUNTER — Ambulatory Visit: Payer: Medicare Other | Admitting: Internal Medicine

## 2012-02-15 ENCOUNTER — Ambulatory Visit (HOSPITAL_COMMUNITY): Admission: RE | Admit: 2012-02-15 | Payer: Medicare HMO | Source: Ambulatory Visit | Admitting: Urology

## 2012-02-15 SURGERY — CYSTOSCOPY, WITH BIOPSY
Anesthesia: General

## 2012-02-19 ENCOUNTER — Encounter: Payer: Self-pay | Admitting: Internal Medicine

## 2012-02-19 ENCOUNTER — Ambulatory Visit (INDEPENDENT_AMBULATORY_CARE_PROVIDER_SITE_OTHER): Payer: Medicare HMO | Admitting: Internal Medicine

## 2012-02-19 VITALS — BP 140/88 | HR 60 | Temp 97.9°F | Wt 201.0 lb

## 2012-02-19 DIAGNOSIS — N139 Obstructive and reflux uropathy, unspecified: Secondary | ICD-10-CM

## 2012-02-19 DIAGNOSIS — I10 Essential (primary) hypertension: Secondary | ICD-10-CM
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
## 2012-02-19 NOTE — Assessment & Plan Note (Signed)
Followed by urology. Foley catheter to be removed. Patient plans to self catheterize himself. Cystoscopy to be rescheduled.

## 2012-02-19 NOTE — Patient Instructions (Addendum)
Please complete the following lab tests before your next follow up appointment:  BMET, A1c - 250.02 

## 2012-02-19 NOTE — Assessment & Plan Note (Signed)
Diabetes control slightly worse. Patient advised to use mealtime insulin when he has significant carbohydrate intake for morning meal.   Lab Results  Component Value Date   HGBA1C 7.7* 02/08/2012   Lab Results  Component Value Date   CREATININE 2.0* 02/08/2012

## 2012-02-19 NOTE — Assessment & Plan Note (Signed)
Stable. No change in medication.  BP: 140/88 mmHg

## 2012-03-31 ENCOUNTER — Ambulatory Visit: Payer: Medicare HMO | Admitting: Internal Medicine

## 2012-04-21 ENCOUNTER — Ambulatory Visit (INDEPENDENT_AMBULATORY_CARE_PROVIDER_SITE_OTHER): Payer: Medicare HMO | Admitting: Internal Medicine

## 2012-04-21 ENCOUNTER — Encounter: Payer: Self-pay | Admitting: Internal Medicine

## 2012-04-21 VITALS — BP 120/80 | HR 64 | Temp 98.2°F | Wt 197.0 lb

## 2012-04-21 DIAGNOSIS — N289 Disorder of kidney and ureter, unspecified: Secondary | ICD-10-CM

## 2012-04-21 DIAGNOSIS — E1349 Other specified diabetes mellitus with other diabetic neurological complication: Secondary | ICD-10-CM

## 2012-04-21 DIAGNOSIS — E1342 Other specified diabetes mellitus with diabetic polyneuropathy: Secondary | ICD-10-CM | POA: Insufficient documentation

## 2012-04-21 DIAGNOSIS — I2510 Atherosclerotic heart disease of native coronary artery without angina pectoris: Secondary | ICD-10-CM

## 2012-04-21 DIAGNOSIS — E1142 Type 2 diabetes mellitus with diabetic polyneuropathy: Secondary | ICD-10-CM

## 2012-04-21 MED ORDER — INSULIN ASPART 100 UNIT/ML ~~LOC~~ SOLN: SUBCUTANEOUS | Status: DC

## 2012-04-21 MED ORDER — INSULIN GLARGINE 100 UNIT/ML ~~LOC~~ SOLN: 30.0000 [IU] | Freq: Every day | SUBCUTANEOUS | Status: DC

## 2012-04-21 NOTE — Assessment & Plan Note (Signed)
Monitor kidney function.  He reports urine volume improved.  He is self catheterizing himself 3 times daily.
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
## 2012-04-21 NOTE — Assessment & Plan Note (Signed)
Patient has mild polyneuropathy mainly his feet. Patient understands to check his feet for cuts and abrasions.  Continue to avoid wearing tight fitting shoes.

## 2012-04-21 NOTE — Assessment & Plan Note (Signed)
Patient reports home blood sugar readings stable. No hypoglycemic episodes.  Continue same insulin regimen. Monitor A1c.

## 2012-04-21 NOTE — Assessment & Plan Note (Signed)
Patient not having any chest symptoms. He has diminished pulses in his lower extremities. He has intermittent leg pains with walking. Consider workup for PAD.  Patient advised to further discuss at his next cardiology follow up.

## 2012-04-22 LAB — HEMOGLOBIN A1C: Hgb A1c MFr Bld: 10.4 % — ABNORMAL HIGH (ref 4.6–6.5)

## 2012-04-22 LAB — BASIC METABOLIC PANEL
BUN: 25 mg/dL — ABNORMAL HIGH (ref 6–23)
CO2: 24 mEq/L (ref 19–32)
Chloride: 104 mEq/L (ref 96–112)
Potassium: 4.7 mEq/L (ref 3.5–5.1)

## 2012-04-24 NOTE — Progress Notes (Signed)
Pt informed and appointment made for 1-23

## 2012-05-06 ENCOUNTER — Ambulatory Visit (INDEPENDENT_AMBULATORY_CARE_PROVIDER_SITE_OTHER): Payer: Medicare HMO | Admitting: Internal Medicine

## 2012-05-06 ENCOUNTER — Encounter: Payer: Self-pay | Admitting: Internal Medicine

## 2012-05-06 VITALS — BP 151/95 | HR 97 | Ht 73.0 in | Wt 195.0 lb

## 2012-05-06 DIAGNOSIS — I2589 Other forms of chronic ischemic heart disease: Secondary | ICD-10-CM

## 2012-05-06 DIAGNOSIS — I5042 Chronic combined systolic (congestive) and diastolic (congestive) heart failure: Secondary | ICD-10-CM

## 2012-05-06 DIAGNOSIS — Z95810 Presence of automatic (implantable) cardiac defibrillator: Secondary | ICD-10-CM | POA: Insufficient documentation

## 2012-05-06 DIAGNOSIS — I255 Ischemic cardiomyopathy: Secondary | ICD-10-CM

## 2012-05-06 DIAGNOSIS — I4891 Unspecified atrial fibrillation: Secondary | ICD-10-CM

## 2012-05-06 LAB — ICD DEVICE OBSERVATION
BATTERY VOLTAGE: 2.53 V
BRDY-0002RV: 40 {beats}/min
TZAT-0001FASTVT: 1
TZAT-0004FASTVT: 8
TZAT-0012FASTVT: 200 ms
TZAT-0018SLOWVT: NEGATIVE
TZON-0004FASTVT: 12
TZON-0008SLOWVT: 50 ms
TZON-0010SLOWVT: 80 ms
TZST-0001FASTVT: 3
TZST-0001FASTVT: 5
TZST-0001SLOWVT: 2
TZST-0001SLOWVT: 5
TZST-0003FASTVT: 36 J
TZST-0003FASTVT: 36 J
TZST-0003SLOWVT: 36 J

## 2012-05-06 NOTE — Patient Instructions (Signed)
Your physician recommends that you schedule a follow-up appointment in: 1 month with Kristin/ Nevin Bloodgood for a device check.  Your physician recommends that you continue on your current medications as directed. Please refer to the Current Medication list given to you today.

## 2012-05-06 NOTE — Assessment & Plan Note (Signed)
Continue current meds 
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
## 2012-05-06 NOTE — Assessment & Plan Note (Signed)
The patient's device was interrogated.  The information was reviewed. No changes were made in the programming.    

## 2012-05-15 ENCOUNTER — Ambulatory Visit: Payer: Medicare HMO | Admitting: Internal Medicine

## 2012-05-16 ENCOUNTER — Ambulatory Visit (INDEPENDENT_AMBULATORY_CARE_PROVIDER_SITE_OTHER): Payer: Medicare HMO | Admitting: Internal Medicine

## 2012-05-16 ENCOUNTER — Encounter: Payer: Self-pay | Admitting: Internal Medicine

## 2012-05-16 VITALS — BP 136/80 | HR 80 | Temp 98.0°F | Wt 193.0 lb

## 2012-05-16 MED ORDER — INSULIN LISPRO 100 UNIT/ML ~~LOC~~ SOLN: SUBCUTANEOUS | Status: DC
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
## 2012-05-16 NOTE — Assessment & Plan Note (Signed)
Patient's A1c is significantly worse. Patient has been using sliding scale that was started wile he was last hospitalized. Patient advised discontinue sliding scale. He advised to use mealtime insulin 3 times daily. He understands to adjust dose based upon carbohydrate intake. Continue monitoring postprandial blood sugars.  Reassess in 3 weeks. Lab Results  Component Value Date   HGBA1C 10.4* 04/21/2012   Lab Results  Component Value Date   CREATININE 1.7* 04/21/2012

## 2012-06-06 ENCOUNTER — Ambulatory Visit: Payer: Medicare HMO | Admitting: Internal Medicine

## 2012-06-10 ENCOUNTER — Ambulatory Visit (INDEPENDENT_AMBULATORY_CARE_PROVIDER_SITE_OTHER): Payer: Medicare HMO | Admitting: Cardiology

## 2012-06-10 ENCOUNTER — Other Ambulatory Visit: Payer: Self-pay

## 2012-06-10 ENCOUNTER — Ambulatory Visit (INDEPENDENT_AMBULATORY_CARE_PROVIDER_SITE_OTHER): Payer: Medicare HMO | Admitting: *Deleted

## 2012-06-10 ENCOUNTER — Encounter: Payer: Self-pay | Admitting: Cardiology

## 2012-06-10 VITALS — BP 140/90 | HR 90 | Ht 74.0 in | Wt 192.1 lb

## 2012-06-10 DIAGNOSIS — I2589 Other forms of chronic ischemic heart disease: Secondary | ICD-10-CM

## 2012-06-10 DIAGNOSIS — Z95810 Presence of automatic (implantable) cardiac defibrillator: Secondary | ICD-10-CM

## 2012-06-10 DIAGNOSIS — I219 Acute myocardial infarction, unspecified: Secondary | ICD-10-CM

## 2012-06-10 DIAGNOSIS — I10 Essential (primary) hypertension: Secondary | ICD-10-CM

## 2012-06-10 DIAGNOSIS — G4733 Obstructive sleep apnea (adult) (pediatric): Secondary | ICD-10-CM

## 2012-06-10 DIAGNOSIS — I255 Ischemic cardiomyopathy: Secondary | ICD-10-CM

## 2012-06-10 DIAGNOSIS — I5042 Chronic combined systolic (congestive) and diastolic (congestive) heart failure: Secondary | ICD-10-CM

## 2012-06-10 NOTE — Progress Notes (Signed)
ICD battery check today.  Not at [facility] yet.  ROV 4 weeks with Ileene Hutchinson, PA.

## 2012-06-10 NOTE — Patient Instructions (Addendum)
The current medical regimen is effective;  continue present plan and medications.  Follow up with Murlean Hark, PA in 1 month for a device check.  Follow up with Dr Percival Spanish in 6 months.
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
## 2012-06-16 LAB — ICD DEVICE OBSERVATION
DEVICE MODEL ICD: 356929
HV IMPEDENCE: 30 Ohm
TZAT-0004SLOWVT: 8
TZAT-0012SLOWVT: 200 ms
TZAT-0013SLOWVT: 3
TZAT-0018FASTVT: NEGATIVE
TZAT-0019FASTVT: 7.5 V
TZAT-0019SLOWVT: 7.5 V
TZAT-0020FASTVT: 1 ms
TZAT-0020SLOWVT: 1 ms
TZON-0003FASTVT: 300 ms
TZON-0004FASTVT: 12
TZON-0004SLOWVT: 12
TZON-0005FASTVT: 6
TZON-0005SLOWVT: 6
TZON-0008FASTVT: 50 ms
TZON-0010FASTVT: 80 ms
TZST-0001FASTVT: 2
TZST-0001SLOWVT: 3
TZST-0001SLOWVT: 4
TZST-0001SLOWVT: 5
TZST-0003FASTVT: 36 J
TZST-0003FASTVT: 36 J
TZST-0003SLOWVT: 25 J
TZST-0003SLOWVT: 36 J
TZST-0003SLOWVT: 36 J

## 2012-06-17 ENCOUNTER — Ambulatory Visit (INDEPENDENT_AMBULATORY_CARE_PROVIDER_SITE_OTHER): Payer: Medicare HMO | Admitting: Internal Medicine

## 2012-06-17 ENCOUNTER — Encounter: Payer: Self-pay | Admitting: Internal Medicine

## 2012-06-17 VITALS — BP 124/94 | HR 80 | Temp 98.3°F | Wt 190.0 lb

## 2012-06-17 DIAGNOSIS — E1149 Type 2 diabetes mellitus with other diabetic neurological complication: Secondary | ICD-10-CM
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
## 2012-06-17 NOTE — Assessment & Plan Note (Addendum)
Blood sugars improving since adding mealtime insulin. 5 units adequate for most meals. Patient advised to titrate to 8-10 units with larger carbohydrate meals. Reassess in 2 months Patient has signs of diabetic neuropathy in his feet bilaterally. This may improve as his diabetes control improves. Patient advised check his feet daily.

## 2012-06-17 NOTE — Patient Instructions (Addendum)
Please complete the following lab tests before your next follow up appointment:  BMET, A1c - 250.02 

## 2012-06-23 ENCOUNTER — Ambulatory Visit (INDEPENDENT_AMBULATORY_CARE_PROVIDER_SITE_OTHER): Payer: Medicare HMO | Admitting: Internal Medicine

## 2012-06-23 ENCOUNTER — Encounter: Payer: Self-pay | Admitting: Internal Medicine

## 2012-06-23 VITALS — BP 130/90 | Temp 98.0°F | Wt 187.0 lb

## 2012-06-23 DIAGNOSIS — L02519 Cutaneous abscess of unspecified hand: Secondary | ICD-10-CM | POA: Insufficient documentation

## 2012-06-23 MED ORDER — SULFAMETHOXAZOLE-TRIMETHOPRIM 800-160 MG PO TABS: 1.0000 | ORAL_TABLET | Freq: Two times a day (BID) | ORAL | Status: DC

## 2012-06-23 MED ORDER — CLINDAMYCIN HCL 300 MG PO CAPS: 300.0000 mg | ORAL_CAPSULE | Freq: Three times a day (TID) | ORAL | Status: DC

## 2012-06-23 NOTE — Assessment & Plan Note (Signed)
67 year old white male with history of poorly controlled diabetes and coronary artery disease presents with abscess in the middle  palm of left hand. Incision and drainage performed. Drained small amount of fluid. Specimen sent for culture. Treat with 2 antibiotics-clindamycin 300 mg 3 times a day and Bactrim DS twice daily for 10 days. Patient to monitor for signs of spreading infection. He understands to proceed to emergency room if he develops fever or worsening redness that spreads up his forearm.  Reassess in 1 week
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
## 2012-06-23 NOTE — Patient Instructions (Addendum)
Soak left hand in Epsom salt solution as directed Please contact our office if your symptoms do not improve or gets worse.

## 2012-06-25 LAB — WOUND CULTURE
Gram Stain: NONE SEEN
Gram Stain: NONE SEEN

## 2012-07-01 ENCOUNTER — Encounter: Payer: Self-pay | Admitting: Internal Medicine

## 2012-07-01 ENCOUNTER — Encounter (HOSPITAL_COMMUNITY): Payer: Self-pay | Admitting: Physical Medicine and Rehabilitation

## 2012-07-01 ENCOUNTER — Emergency Department (HOSPITAL_COMMUNITY)
Admission: EM | Admit: 2012-07-01 | Discharge: 2012-07-01 | Disposition: A | Discharge status: Home / Self Care | Payer: Medicare HMO | Attending: Emergency Medicine | Admitting: Emergency Medicine

## 2012-07-01 ENCOUNTER — Ambulatory Visit (INDEPENDENT_AMBULATORY_CARE_PROVIDER_SITE_OTHER): Payer: Medicare HMO | Admitting: Internal Medicine

## 2012-07-01 VITALS — BP 124/84 | Temp 97.9°F | Wt 181.0 lb

## 2012-07-01 DIAGNOSIS — Z862 Personal history of diseases of the blood and blood-forming organs and certain disorders involving the immune mechanism: Secondary | ICD-10-CM | POA: Insufficient documentation

## 2012-07-01 DIAGNOSIS — I509 Heart failure, unspecified: Secondary | ICD-10-CM | POA: Insufficient documentation

## 2012-07-01 DIAGNOSIS — L02519 Cutaneous abscess of unspecified hand: Secondary | ICD-10-CM

## 2012-07-01 DIAGNOSIS — Z95810 Presence of automatic (implantable) cardiac defibrillator: Secondary | ICD-10-CM | POA: Insufficient documentation

## 2012-07-01 DIAGNOSIS — I10 Essential (primary) hypertension: Secondary | ICD-10-CM | POA: Insufficient documentation

## 2012-07-01 DIAGNOSIS — I252 Old myocardial infarction: Secondary | ICD-10-CM | POA: Insufficient documentation

## 2012-07-01 DIAGNOSIS — Z8669 Personal history of other diseases of the nervous system and sense organs: Secondary | ICD-10-CM | POA: Insufficient documentation

## 2012-07-01 DIAGNOSIS — Z950 Presence of cardiac pacemaker: Secondary | ICD-10-CM | POA: Insufficient documentation

## 2012-07-01 DIAGNOSIS — Z79899 Other long term (current) drug therapy: Secondary | ICD-10-CM | POA: Insufficient documentation

## 2012-07-01 DIAGNOSIS — Z8639 Personal history of other endocrine, nutritional and metabolic disease: Secondary | ICD-10-CM | POA: Insufficient documentation

## 2012-07-01 DIAGNOSIS — Z7982 Long term (current) use of aspirin: Secondary | ICD-10-CM | POA: Insufficient documentation

## 2012-07-01 DIAGNOSIS — Z794 Long term (current) use of insulin: Secondary | ICD-10-CM | POA: Insufficient documentation

## 2012-07-01 DIAGNOSIS — E119 Type 2 diabetes mellitus without complications: Secondary | ICD-10-CM | POA: Insufficient documentation

## 2012-07-01 DIAGNOSIS — G4730 Sleep apnea, unspecified: Secondary | ICD-10-CM | POA: Insufficient documentation

## 2012-07-01 DIAGNOSIS — Z9981 Dependence on supplemental oxygen: Secondary | ICD-10-CM | POA: Insufficient documentation

## 2012-07-01 MED ORDER — CLINDAMYCIN PHOSPHATE 600 MG/50ML IV SOLN
600.0000 mg | Freq: Once | INTRAVENOUS | Status: AC
  Administered 2012-07-01: 600 mg via INTRAVENOUS
  Filled 2012-07-01: qty 50
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
## 2012-07-01 NOTE — Patient Instructions (Addendum)
Please proceed to [facility] emergency room for further evaluation and treatment

## 2012-07-01 NOTE — Assessment & Plan Note (Signed)
Left hand abscess has gotten worse.  It is much larger and has possible central necrosis. I suspect he will need surgical drainage/debridement. Attempted to contact 3 orthopedic groups but they could not see patient today. Patient advised to proceed to [facility] Emergency room for further evaluation and treatment. Patient may need IV antibiotics in addition to surgical intervention.

## 2012-07-01 NOTE — ED Provider Notes (Signed)
Medical screening examination/treatment/procedure(s) were conducted as a shared visit with non-physician practitioner(s) and myself.  I personally evaluated the patient during the encounter.  67 year old male with an abscess in the palmar aspect of his left hand. Patient does have some mild erythema going across the flexor crease of his wrist, but he has no significant pain range of motion. No tenderness along the flexor sheaths distally to this. No pain with active or passive range of motion of the digits.  Patient reports that his swelling, pain and erythema has been improving. He still has a fluctuant lesion which was incised and drained. He is already on clindamycin and trimethoprim- sulfamethoxazole. He is a diabetic, but he is afebrile and has no systemic complaints. I feel he is safe for discharge at this time. I do not feel that he needs emergent hand surgery consultation. Recommended continued warm soaks. Emergent return precautions were discussed.  Virgel Manifold, MD 07/01/12 [phone]
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
## 2012-07-01 NOTE — ED Notes (Signed)
Pt presents to department for evaluation of L hand abscess. Ongoing x1 week. Was seen by PCP and had I&D performed, but now states increased pain and swelling to palm of L hand. Swelling also noted to finger and pt states his hand feels "tight." radial pulse present. Pt is diabetic. Pt is alert and oriented x4.

## 2012-07-02 ENCOUNTER — Encounter: Payer: Self-pay | Admitting: Internal Medicine

## 2012-07-02 ENCOUNTER — Telehealth: Payer: Self-pay | Admitting: *Deleted

## 2012-07-02 DIAGNOSIS — L02519 Cutaneous abscess of unspecified hand: Secondary | ICD-10-CM

## 2012-07-02 MED ORDER — CLINDAMYCIN HCL 300 MG PO CAPS: 300.0000 mg | ORAL_CAPSULE | Freq: Three times a day (TID) | ORAL | Status: DC

## 2012-07-02 MED ORDER — SULFAMETHOXAZOLE-TRIMETHOPRIM 800-160 MG PO TABS: 1.0000 | ORAL_TABLET | Freq: Two times a day (BID) | ORAL | Status: DC

## 2012-07-02 NOTE — Telephone Encounter (Signed)
Pt aware, referral order placed

## 2012-07-02 NOTE — Telephone Encounter (Signed)
No answer, no machine.

## 2012-07-02 NOTE — Telephone Encounter (Signed)
Pt was not admitted yesterday.  They gave him IV antibiotics and lanced it again.  ER dr told him he didn't need any additional oral antibiotics just finish off what he had.  Pt stated that he had only 3 pills left and wanted to ask Dr Shawna Orleans if that was enough.

## 2012-07-02 NOTE — ED Notes (Signed)
Pt ambulating independently w/ steady gait on d/c in no acute distress, A&Ox4.D/c instructions reviewed w/ pt and family - pt and family deny any further questions or concerns at present.  

## 2012-07-02 NOTE — Telephone Encounter (Signed)
Call in additional 7 days worth of both abx.  Please try to arrange f/u appt with Dr. Amedeo Plenty (hand specialist) at [facility] within 1 week.

## 2012-07-03 NOTE — ED Provider Notes (Signed)
Medical screening examination/treatment/procedure(s) were conducted as a shared visit with non-physician practitioner(s) and myself.  I personally evaluated the patient during the encounter.  Please see completed note.  Virgel Manifold, MD 07/03/12 [phone]

## 2012-07-08 ENCOUNTER — Encounter: Payer: Medicare HMO | Admitting: Cardiology

## 2012-07-15 ENCOUNTER — Ambulatory Visit (INDEPENDENT_AMBULATORY_CARE_PROVIDER_SITE_OTHER): Payer: Medicare HMO | Admitting: Cardiology

## 2012-07-15 DIAGNOSIS — Z95810 Presence of automatic (implantable) cardiac defibrillator: Secondary | ICD-10-CM

## 2012-07-15 DIAGNOSIS — I255 Ischemic cardiomyopathy: Secondary | ICD-10-CM

## 2012-07-15 DIAGNOSIS — Z4502 Encounter for adjustment and management of automatic implantable cardiac defibrillator: Secondary | ICD-10-CM

## 2012-07-15 DIAGNOSIS — I2589 Other forms of chronic ischemic heart disease: Secondary | ICD-10-CM

## 2012-07-15 LAB — ICD DEVICE OBSERVATION
TZAT-0012FASTVT: 200 ms
TZAT-0012SLOWVT: 200 ms
TZAT-0013FASTVT: 1
TZAT-0013SLOWVT: 3
TZAT-0018FASTVT: NEGATIVE
TZAT-0019FASTVT: 7.5 V
TZAT-0019SLOWVT: 7.5 V
TZAT-0020FASTVT: 1 ms
TZAT-0020SLOWVT: 1 ms
TZON-0003FASTVT: 300 ms
TZON-0003SLOWVT: 350 ms
TZON-0004FASTVT: 12
TZON-0005FASTVT: 6
TZON-0008FASTVT: 50 ms
TZON-0010SLOWVT: 80 ms
TZST-0001FASTVT: 4
TZST-0001SLOWVT: 2
TZST-0001SLOWVT: 4
TZST-0003FASTVT: 36 J
TZST-0003FASTVT: 36 J
TZST-0003SLOWVT: 36 J
TZST-0003SLOWVT: 36 J

## 2012-07-15 NOTE — Progress Notes (Signed)
ICD battery check only. Battery voltage 2.50. See PaceArt report. Return in 1 month for battery recheck.

## 2012-07-15 NOTE — Patient Instructions (Signed)
You have a follow-up appointment on 08/15/2012 @ 9:00 am with [facility] for a Device Check.

## 2012-08-12 ENCOUNTER — Encounter: Payer: Self-pay | Admitting: Internal Medicine

## 2012-08-15 ENCOUNTER — Encounter: Payer: Medicare HMO | Admitting: Cardiology

## 2012-08-15 ENCOUNTER — Ambulatory Visit: Payer: Medicare HMO | Admitting: Internal Medicine

## 2012-08-15 ENCOUNTER — Ambulatory Visit (INDEPENDENT_AMBULATORY_CARE_PROVIDER_SITE_OTHER): Payer: Medicare HMO | Admitting: Cardiology

## 2012-08-15 ENCOUNTER — Encounter: Payer: Self-pay | Admitting: Cardiology

## 2012-08-15 VITALS — BP 138/94 | HR 88 | Ht 73.0 in | Wt 190.0 lb

## 2012-08-15 DIAGNOSIS — Z95810 Presence of automatic (implantable) cardiac defibrillator: Secondary | ICD-10-CM

## 2012-08-15 DIAGNOSIS — Z4502 Encounter for adjustment and management of automatic implantable cardiac defibrillator: Secondary | ICD-10-CM

## 2012-08-15 LAB — ICD DEVICE OBSERVATION
DEVICE MODEL ICD: 356929
TZAT-0001FASTVT: 1
TZAT-0004SLOWVT: 8
TZAT-0012FASTVT: 200 ms
TZAT-0013SLOWVT: 3
TZAT-0018SLOWVT: NEGATIVE
TZAT-0019FASTVT: 7.5 V
TZAT-0019SLOWVT: 7.5 V
TZAT-0020FASTVT: 1 ms
TZON-0003SLOWVT: 350 ms
TZON-0004FASTVT: 12
TZON-0004SLOWVT: 12
TZON-0005FASTVT: 6
TZON-0008FASTVT: 50 ms
TZON-0008SLOWVT: 50 ms
TZON-0010FASTVT: 80 ms
TZST-0001FASTVT: 2
TZST-0001FASTVT: 4
TZST-0001SLOWVT: 3
TZST-0001SLOWVT: 5
TZST-0003FASTVT: 36 J
TZST-0003FASTVT: 36 J
TZST-0003SLOWVT: 36 J
TZST-0003SLOWVT: 36 J

## 2012-08-15 NOTE — Patient Instructions (Signed)
Your physician recommends that you schedule a follow-up appointment in: 1 month with [place]  Your physician recommends that you continue on your current medications as directed. Please refer to the Current Medication list given to you today.

## 2012-08-15 NOTE — Progress Notes (Signed)
ICD battery check only. Battery voltage 2.49. No episodes. Continue monthly battery checks.

## 2012-08-28 ENCOUNTER — Ambulatory Visit (INDEPENDENT_AMBULATORY_CARE_PROVIDER_SITE_OTHER): Payer: Medicare HMO | Admitting: Internal Medicine

## 2012-08-28 ENCOUNTER — Encounter: Payer: Self-pay | Admitting: Internal Medicine

## 2012-08-28 VITALS — BP 134/88 | HR 92 | Temp 98.6°F | Wt 189.0 lb

## 2012-08-28 DIAGNOSIS — I10 Essential (primary) hypertension: Secondary | ICD-10-CM

## 2012-08-28 DIAGNOSIS — E785 Hyperlipidemia, unspecified: Secondary | ICD-10-CM

## 2012-08-28 DIAGNOSIS — L03119 Cellulitis of unspecified part of limb: Secondary | ICD-10-CM

## 2012-08-28 DIAGNOSIS — E1149 Type 2 diabetes mellitus with other diabetic neurological complication: Secondary | ICD-10-CM

## 2012-08-28 DIAGNOSIS — E1165 Type 2 diabetes mellitus with hyperglycemia: Secondary | ICD-10-CM

## 2012-08-28 DIAGNOSIS — E1142 Type 2 diabetes mellitus with diabetic polyneuropathy: Secondary | ICD-10-CM

## 2012-08-28 DIAGNOSIS — L02519 Cutaneous abscess of unspecified hand: Secondary | ICD-10-CM

## 2012-08-28 DIAGNOSIS — N289 Disorder of kidney and ureter, unspecified: Secondary | ICD-10-CM

## 2012-08-28 LAB — BASIC METABOLIC PANEL
CO2: 24 mEq/L (ref 19–32)
Calcium: 8.7 mg/dL (ref 8.4–10.5)
GFR: 51.73 mL/min — ABNORMAL LOW (ref >60.00)
Sodium: 141 mEq/L (ref 135–145)

## 2012-08-28 MED ORDER — NEBIVOLOL HCL 10 MG PO TABS: 10.0000 mg | ORAL_TABLET | Freq: Every morning | ORAL | Status: DC

## 2012-08-28 MED ORDER — BENAZEPRIL HCL 5 MG PO TABS: 5.0000 mg | ORAL_TABLET | Freq: Every morning | ORAL | Status: DC

## 2012-08-28 MED ORDER — ATORVASTATIN CALCIUM 80 MG PO TABS: 80.0000 mg | ORAL_TABLET | Freq: Every day | ORAL | Status: DC

## 2012-08-28 MED ORDER — INSULIN GLARGINE 100 UNIT/ML ~~LOC~~ SOLN: 30.0000 [IU] | Freq: Every day | SUBCUTANEOUS | Status: DC

## 2012-08-28 MED ORDER — INSULIN LISPRO 100 UNIT/ML ~~LOC~~ SOLN: SUBCUTANEOUS | Status: DC

## 2012-08-28 MED ORDER — FUROSEMIDE 40 MG PO TABS: 20.0000 mg | ORAL_TABLET | Freq: Every day | ORAL | Status: DC
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
## 2012-08-28 NOTE — Assessment & Plan Note (Signed)
Monitor A1c.  Continue same insulin regimen.

## 2012-08-28 NOTE — Assessment & Plan Note (Signed)
Left hand abscess resolved. He was initially seen in ER, then referred to hand specialist. He underwent incision and drainage with packing. He has mild granular tissue palm of left hand - otherwise it has healed well.

## 2012-09-16 ENCOUNTER — Ambulatory Visit (INDEPENDENT_AMBULATORY_CARE_PROVIDER_SITE_OTHER): Payer: Medicare HMO | Admitting: Cardiology

## 2012-09-16 ENCOUNTER — Encounter: Payer: Self-pay | Admitting: Cardiology

## 2012-09-16 ENCOUNTER — Other Ambulatory Visit: Payer: Self-pay

## 2012-09-16 VITALS — BP 120/72 | HR 70 | Ht 73.0 in | Wt 189.2 lb

## 2012-09-16 DIAGNOSIS — Z95810 Presence of automatic (implantable) cardiac defibrillator: Secondary | ICD-10-CM

## 2012-09-16 DIAGNOSIS — Z4502 Encounter for adjustment and management of automatic implantable cardiac defibrillator: Secondary | ICD-10-CM

## 2012-09-16 DIAGNOSIS — I2589 Other forms of chronic ischemic heart disease: Secondary | ICD-10-CM

## 2012-09-16 DIAGNOSIS — I255 Ischemic cardiomyopathy: Secondary | ICD-10-CM

## 2012-09-16 LAB — ICD DEVICE OBSERVATION
DEVICE MODEL ICD: 356929
FVT: 0
PACEART VT: 0
RV LEAD AMPLITUDE: 12 mv
RV LEAD IMPEDENCE ICD: 470 Ohm
RV LEAD THRESHOLD: 0.75 V
TZAT-0001FASTVT: 1
TZAT-0012SLOWVT: 200 ms
TZAT-0013SLOWVT: 3
TZAT-0018FASTVT: NEGATIVE
TZAT-0018SLOWVT: NEGATIVE
TZAT-0019FASTVT: 7.5 V
TZAT-0020FASTVT: 1 ms
TZON-0003SLOWVT: 350 ms
TZON-0004FASTVT: 12
TZON-0005FASTVT: 6
TZON-0008FASTVT: 50 ms
TZON-0008SLOWVT: 50 ms
TZON-0010FASTVT: 80 ms
TZST-0001FASTVT: 4
TZST-0001SLOWVT: 2
TZST-0001SLOWVT: 3
TZST-0001SLOWVT: 5
TZST-0003FASTVT: 36 J
TZST-0003FASTVT: 36 J
TZST-0003FASTVT: 36 J
TZST-0003SLOWVT: 36 J
TZST-0003SLOWVT: 36 J
VENTRICULAR PACING ICD: 0 pct

## 2012-09-16 NOTE — Progress Notes (Signed)
Monthly ICD check in device clinic. Battery nearing ERI, voltage 2.48. Normal ICD function. No episodes. No programming changes made. See PaceArt report.

## 2012-09-23 ENCOUNTER — Ambulatory Visit: Payer: Medicare HMO | Admitting: Internal Medicine

## 2012-09-30 ENCOUNTER — Ambulatory Visit: Payer: Medicare HMO | Admitting: Internal Medicine

## 2012-10-08 ENCOUNTER — Encounter: Payer: Self-pay | Admitting: Internal Medicine

## 2012-10-13 ENCOUNTER — Encounter: Payer: Self-pay | Admitting: *Deleted

## 2012-10-13 ENCOUNTER — Ambulatory Visit (INDEPENDENT_AMBULATORY_CARE_PROVIDER_SITE_OTHER): Payer: Medicare HMO | Admitting: *Deleted

## 2012-10-13 DIAGNOSIS — I428 Other cardiomyopathies: Secondary | ICD-10-CM

## 2012-10-13 DIAGNOSIS — Z95810 Presence of automatic (implantable) cardiac defibrillator: Secondary | ICD-10-CM

## 2012-10-13 LAB — BASIC METABOLIC PANEL
CO2: 27 mEq/L (ref 19–32)
Chloride: 109 mEq/L (ref 96–112)
Creatinine, Ser: 1.5 mg/dL (ref 0.4–1.5)
Sodium: 140 mEq/L (ref 135–145)

## 2012-10-13 LAB — ICD DEVICE OBSERVATION: DEVICE MODEL ICD: 356929

## 2012-10-13 LAB — CBC WITH DIFFERENTIAL/PLATELET
Basophils Absolute: 0.1 10*3/uL (ref 0.0–0.1)
Eosinophils Absolute: 0.1 10*3/uL (ref 0.0–0.7)
Lymphocytes Relative: 19.2 % (ref 12.0–46.0)
MCHC: 33.1 g/dL (ref 30.0–36.0)
Monocytes Relative: 8.3 % (ref 3.0–12.0)
Neutro Abs: 5.4 10*3/uL (ref 1.4–7.7)
Neutrophils Relative %: 69.9 % (ref 43.0–77.0)
Platelets: 169 10*3/uL (ref 150.0–400.0)
RDW: 13.7 % (ref 11.5–14.6)

## 2012-10-13 NOTE — Progress Notes (Signed)
Pt seen in clinic for follow up of ICD.  No complaints of chest pain, shortness of breath, dizziness, palpitations, or shocks.  Device battery voltage at 2.46.  Pt to be set up for gen change per Dr Caryl Comes.  Scheduled for 10-22-12 at 8:30AM.  Instructions reviewed with patient.  Labs obtained today.   For full details, see PaceArt report.   Chanetta Marshall, RN, BSN 10/13/2012 10:53 AM

## 2012-10-13 NOTE — Patient Instructions (Addendum)
See instruction sheet for ICD generator change.

## 2012-10-14 ENCOUNTER — Encounter (HOSPITAL_COMMUNITY): Payer: Self-pay

## 2012-10-20 ENCOUNTER — Telehealth: Payer: Self-pay | Admitting: *Deleted

## 2012-10-20 NOTE — Telephone Encounter (Signed)
Message copied by Earvin Hansen on Mon Oct 20, 2012  1:24 PM ------      Message from: Deboraha Sprang      Created: Fri Oct 17, 2012  5:23 PM       Please Inform Patient that labs are normal except for elevated blood sugarThanks       ------

## 2012-10-20 NOTE — Telephone Encounter (Signed)
Advised patient of lab results  

## 2012-10-21 MED ORDER — SODIUM CHLORIDE 0.9 % IR SOLN
80.0000 mg | Status: DC
  Filled 2012-10-21: qty 2

## 2012-10-21 MED ORDER — CEFAZOLIN SODIUM-DEXTROSE 2-3 GM-% IV SOLR
2.0000 g | INTRAVENOUS | Status: DC
  Filled 2012-10-21: qty 50

## 2012-10-22 ENCOUNTER — Ambulatory Visit (HOSPITAL_COMMUNITY)
Admission: RE | Admit: 2012-10-22 | Discharge: 2012-10-22 | Disposition: A | Discharge status: Home / Self Care | Payer: Medicare HMO | Source: Ambulatory Visit | Attending: Internal Medicine | Admitting: Internal Medicine

## 2012-10-22 ENCOUNTER — Encounter (HOSPITAL_COMMUNITY)
Admission: RE | Disposition: A | Discharge status: Home / Self Care | Payer: Self-pay | Source: Ambulatory Visit | Attending: Internal Medicine

## 2012-10-22 DIAGNOSIS — I2589 Other forms of chronic ischemic heart disease: Secondary | ICD-10-CM

## 2012-10-22 DIAGNOSIS — I2510 Atherosclerotic heart disease of native coronary artery without angina pectoris: Secondary | ICD-10-CM | POA: Insufficient documentation

## 2012-10-22 DIAGNOSIS — I509 Heart failure, unspecified: Secondary | ICD-10-CM | POA: Insufficient documentation

## 2012-10-22 DIAGNOSIS — I10 Essential (primary) hypertension: Secondary | ICD-10-CM | POA: Insufficient documentation

## 2012-10-22 DIAGNOSIS — G4733 Obstructive sleep apnea (adult) (pediatric): Secondary | ICD-10-CM | POA: Insufficient documentation

## 2012-10-22 DIAGNOSIS — Z4502 Encounter for adjustment and management of automatic implantable cardiac defibrillator: Secondary | ICD-10-CM | POA: Insufficient documentation

## 2012-10-22 DIAGNOSIS — I5022 Chronic systolic (congestive) heart failure: Secondary | ICD-10-CM | POA: Insufficient documentation

## 2012-10-22 DIAGNOSIS — G609 Hereditary and idiopathic neuropathy, unspecified: Secondary | ICD-10-CM | POA: Insufficient documentation

## 2012-10-22 DIAGNOSIS — M109 Gout, unspecified: Secondary | ICD-10-CM | POA: Insufficient documentation

## 2012-10-22 DIAGNOSIS — E119 Type 2 diabetes mellitus without complications: Secondary | ICD-10-CM | POA: Insufficient documentation

## 2012-10-22 DIAGNOSIS — I252 Old myocardial infarction: Secondary | ICD-10-CM | POA: Insufficient documentation

## 2012-10-22 HISTORY — PX: IMPLANTABLE CARDIOVERTER DEFIBRILLATOR (ICD) GENERATOR CHANGE: SHX5469

## 2012-10-22 LAB — GLUCOSE, CAPILLARY
Glucose-Capillary: 160 mg/dL — ABNORMAL HIGH (ref 70–99)
Glucose-Capillary: 172 mg/dL — ABNORMAL HIGH (ref 70–99)

## 2012-10-22 LAB — SURGICAL PCR SCREEN
MRSA, PCR: NEGATIVE
Staphylococcus aureus: NEGATIVE

## 2012-10-22 SURGERY — ICD GENERATOR CHANGE
Anesthesia: LOCAL

## 2012-10-22 MED ORDER — SODIUM CHLORIDE 0.9 % IV SOLN: INTRAVENOUS | Status: DC

## 2012-10-22 MED ORDER — CHLORHEXIDINE GLUCONATE 4 % EX LIQD
60.0000 mL | Freq: Once | CUTANEOUS | Status: DC
  Filled 2012-10-22: qty 60

## 2012-10-22 MED ORDER — MUPIROCIN 2 % EX OINT
TOPICAL_OINTMENT | Freq: Two times a day (BID) | CUTANEOUS | Status: DC
  Administered 2012-10-22: 07:00:00 via NASAL

## 2012-10-22 MED ORDER — MUPIROCIN 2 % EX OINT
TOPICAL_OINTMENT | CUTANEOUS | Status: AC
  Filled 2012-10-22: qty 22

## 2012-10-22 MED ORDER — CEFAZOLIN SODIUM-DEXTROSE 2-3 GM-% IV SOLR
INTRAVENOUS | Status: AC
  Filled 2012-10-22: qty 50

## 2012-10-22 MED ORDER — ONDANSETRON HCL 4 MG/2ML IJ SOLN: 4.0000 mg | Freq: Four times a day (QID) | INTRAMUSCULAR | Status: DC | PRN

## 2012-10-22 MED ORDER — LIDOCAINE HCL (PF) 1 % IJ SOLN
INTRAMUSCULAR | Status: AC
  Filled 2012-10-22: qty 60

## 2012-10-22 MED ORDER — MIDAZOLAM HCL 5 MG/5ML IJ SOLN
INTRAMUSCULAR | Status: AC
  Filled 2012-10-22: qty 5

## 2012-10-22 MED ORDER — ACETAMINOPHEN 325 MG PO TABS: 325.0000 mg | ORAL_TABLET | ORAL | Status: DC | PRN

## 2012-10-22 MED ORDER — FENTANYL CITRATE 0.05 MG/ML IJ SOLN
INTRAMUSCULAR | Status: AC
  Filled 2012-10-22: qty 2
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
## 2012-10-22 NOTE — CV Procedure (Signed)
Preoperative diagnosis  Ischemic cardiomyopathy previous ICD ERI Postoperative diagnosis same/   Procedure: Generator replacement     Following informed consent the patient was brought to the electrophysiology laboratory in place of the fluoroscopic table in the supine position after routine prep and drape lidocaine was infiltrated in the region of the previous incision and carried down to later the device pocket using sharp dissection and electrocautery. The pocket was opened the device was freed up and was explanted.  Interrogation of the previously implanted ICD ventricular lead Clio [id number]  demonstrated an R wave of 23  millivolts., and impedance of 460 ohms, and a pacing threshold of 0.7 volts at 0.5 msec  HV coils had pacing impedances within range.    The lead was  inspected. Repair was not  needed. The lead was then attached to a [employer] Jude  pulse generator, serial number [id number].    Through the device the ICD ventricular lead demonstrated an R wave of >12  millivolts., and impedance of 480 ohms, and a pacing threshold of 0.75 volts at 0.5 msec   High voltage impedances were 45  ohms  The pocket was irrigated with antibiotic containing saline solution hemostasis was assured and the leads and the device were placed in the pocket. The wound was then closed in 3 layers in normal fashion.  The patient tolerated the procedure without apparent complication.   Virl Axe   \

## 2012-10-22 NOTE — Interval H&P Note (Signed)
History and Physical Interval Note:  10/22/2012 7:43 AM  [name]  has presented today for surgery, with the diagnosis of cm  The various methods of treatment have been discussed with the patient and family. After consideration of risks, benefits and other options for treatment, the patient has consented to  Procedure(s): ICD GENERATOR CHANGE (N/A) as a surgical intervention .  The patient's history has been reviewed, patient examined, no change in status, stable for surgery.  I have reviewed the patient's chart and labs.  Questions were answered to the patient's satisfaction.     [name] [name]

## 2012-11-03 ENCOUNTER — Ambulatory Visit (INDEPENDENT_AMBULATORY_CARE_PROVIDER_SITE_OTHER): Payer: Medicare HMO | Admitting: *Deleted

## 2012-11-03 DIAGNOSIS — Z95810 Presence of automatic (implantable) cardiac defibrillator: Secondary | ICD-10-CM

## 2012-11-03 DIAGNOSIS — I255 Ischemic cardiomyopathy: Secondary | ICD-10-CM

## 2012-11-03 DIAGNOSIS — I2589 Other forms of chronic ischemic heart disease: Secondary | ICD-10-CM

## 2012-11-03 DIAGNOSIS — I5042 Chronic combined systolic (congestive) and diastolic (congestive) heart failure: Secondary | ICD-10-CM

## 2012-11-03 LAB — ICD DEVICE OBSERVATION
DEVICE MODEL ICD: 1109991
FVT: 0
PACEART VT: 0
RV LEAD THRESHOLD: 0.75 V
TOT-0007: 0
TOT-0008: 0
TZAT-0004SLOWVT: 8
TZAT-0018SLOWVT: NEGATIVE
TZON-0003SLOWVT: 300 ms
TZON-0010SLOWVT: 40 ms
TZST-0001SLOWVT: 3
TZST-0001SLOWVT: 5
TZST-0003SLOWVT: 25 J
TZST-0003SLOWVT: 36 J
VENTRICULAR PACING ICD: 0.03 pct

## 2012-11-03 NOTE — Progress Notes (Signed)
Pt seen in device clinic for follow up of recent ICD changeout.  Wound well healed.  No redness, swelling, or edema.  Steri-strips removed today.   Device interrogated and found to be functioning normally.  Changed VF NID from 20 to 24. See PaceArt for full details.  Pt denies chest pain, shortness of breath, palpitations, or dizziness.  [facility] 02/09/13 & ROV with Dr. Caryl Comes in 12 months.   [name] [name] 11/03/2012 4:55 PM

## 2012-11-28 ENCOUNTER — Encounter: Payer: Self-pay | Admitting: Internal Medicine

## 2012-11-28 ENCOUNTER — Ambulatory Visit (INDEPENDENT_AMBULATORY_CARE_PROVIDER_SITE_OTHER): Payer: Self-pay | Admitting: Internal Medicine

## 2012-11-28 VITALS — BP 122/90 | HR 76 | Temp 98.5°F | Wt 199.0 lb

## 2012-11-28 DIAGNOSIS — E1149 Type 2 diabetes mellitus with other diabetic neurological complication: Secondary | ICD-10-CM

## 2012-11-28 DIAGNOSIS — E1142 Type 2 diabetes mellitus with diabetic polyneuropathy: Secondary | ICD-10-CM

## 2012-11-28 DIAGNOSIS — I10 Essential (primary) hypertension: Secondary | ICD-10-CM

## 2012-11-28 DIAGNOSIS — N289 Disorder of kidney and ureter, unspecified: Secondary | ICD-10-CM

## 2012-11-28 DIAGNOSIS — E1165 Type 2 diabetes mellitus with hyperglycemia: Secondary | ICD-10-CM

## 2012-11-28 MED ORDER — INSULIN GLARGINE 100 UNIT/ML ~~LOC~~ SOLN: 35.0000 [IU] | Freq: Every day | SUBCUTANEOUS | Status: DC

## 2012-11-28 NOTE — Assessment & Plan Note (Signed)
Cr stabilized.  Monitor electrolytes and kidney function. He continues to self catheterize himself 3 times daily.
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
## 2012-11-28 NOTE — Assessment & Plan Note (Addendum)
Patient reports morning blood sugars and postprandial blood sugars are usually around 150. However his A1c is much higher than expected. Repeat A1c. Increase Lantus dose to 35 units.. Discussed referral to endocrinologist if A1c persistently elevated.

## 2012-11-29 LAB — BASIC METABOLIC PANEL
BUN: 25 mg/dL — ABNORMAL HIGH (ref 6–23)
CO2: 24 mEq/L (ref 19–32)
Calcium: 8.8 mg/dL (ref 8.4–10.5)
Creat: 1.58 mg/dL — ABNORMAL HIGH (ref 0.50–1.35)
Glucose, Bld: 136 mg/dL — ABNORMAL HIGH (ref 70–99)

## 2012-12-01 ENCOUNTER — Other Ambulatory Visit: Payer: Self-pay | Admitting: Family Medicine

## 2013-02-09 ENCOUNTER — Ambulatory Visit (INDEPENDENT_AMBULATORY_CARE_PROVIDER_SITE_OTHER): Payer: Medicare HMO | Admitting: *Deleted

## 2013-02-09 ENCOUNTER — Encounter: Payer: Self-pay | Admitting: Internal Medicine

## 2013-02-09 DIAGNOSIS — Z95810 Presence of automatic (implantable) cardiac defibrillator: Secondary | ICD-10-CM

## 2013-02-09 DIAGNOSIS — I2589 Other forms of chronic ischemic heart disease: Secondary | ICD-10-CM

## 2013-02-09 DIAGNOSIS — I5042 Chronic combined systolic (congestive) and diastolic (congestive) heart failure: Secondary | ICD-10-CM

## 2013-02-09 DIAGNOSIS — I255 Ischemic cardiomyopathy: Secondary | ICD-10-CM

## 2013-02-10 LAB — REMOTE ICD DEVICE
HV IMPEDENCE: 45 Ohm
TZAT-0013SLOWVT: 3
TZAT-0018SLOWVT: NEGATIVE
TZAT-0020SLOWVT: 1 ms
TZON-0003SLOWVT: 300 ms
TZON-0005SLOWVT: 6
TZST-0001SLOWVT: 2
TZST-0001SLOWVT: 5
TZST-0003SLOWVT: 30 J
TZST-0003SLOWVT: 36 J
VENTRICULAR PACING ICD: 1 pct

## 2013-02-25 ENCOUNTER — Telehealth: Payer: Self-pay

## 2013-02-25 ENCOUNTER — Other Ambulatory Visit: Payer: Self-pay | Admitting: Internal Medicine

## 2013-02-25 DIAGNOSIS — N289 Disorder of kidney and ureter, unspecified: Secondary | ICD-10-CM

## 2013-02-25 DIAGNOSIS — R809 Proteinuria, unspecified: Secondary | ICD-10-CM | POA: Insufficient documentation

## 2013-02-25 DIAGNOSIS — R823 Hemoglobinuria: Secondary | ICD-10-CM

## 2013-02-25 DIAGNOSIS — E1121 Type 2 diabetes mellitus with diabetic nephropathy: Secondary | ICD-10-CM | POA: Insufficient documentation

## 2013-02-25 NOTE — Telephone Encounter (Signed)
Message copied by Colleen Can on Wed Feb 25, 2013  1:13 PM ------      Message from: Rosine Abe      Created: Wed Feb 25, 2013  1:09 PM       Call pt - I received lab results from [facility].  It shows pt spilling significant amount of protein (albumin) in urine.  I suggest referral to nephrologist.              Place order for referral to nephrologist - Corliss Parish, MD      Re: proteinuria.              Also I ordered 24 urine test for protein and 24 hr urine for creatinine.  Patient needs to complete before referral ------

## 2013-02-25 NOTE — Telephone Encounter (Signed)
Called and spoke with pt and pt is aware of 24 hour urine.  Pt already has an appt on 11/7 and will get the supplies for the 24 hour urine then.

## 2013-02-25 NOTE — Assessment & Plan Note (Signed)
[facility] - urine Albumin test 57.54 02/13/2013 - collection date

## 2013-02-25 NOTE — Telephone Encounter (Signed)
Per [name]- pt needs a renal artery doppler as well.  Ordered.

## 2013-02-27 ENCOUNTER — Other Ambulatory Visit (INDEPENDENT_AMBULATORY_CARE_PROVIDER_SITE_OTHER): Payer: Medicare HMO

## 2013-02-27 ENCOUNTER — Encounter: Payer: Self-pay | Admitting: *Deleted

## 2013-02-27 DIAGNOSIS — IMO0001 Reserved for inherently not codable concepts without codable children: Secondary | ICD-10-CM

## 2013-02-27 DIAGNOSIS — R809 Proteinuria, unspecified: Secondary | ICD-10-CM

## 2013-02-27 LAB — BASIC METABOLIC PANEL
Calcium: 8.8 mg/dL (ref 8.4–10.5)
Chloride: 110 mEq/L (ref 96–112)
Creatinine, Ser: 1.5 mg/dL (ref 0.4–1.5)
GFR: 51.24 mL/min — ABNORMAL LOW (ref >60.00)

## 2013-02-27 LAB — HEMOGLOBIN A1C: Hgb A1c MFr Bld: 8.7 % — ABNORMAL HIGH (ref 4.6–6.5)

## 2013-03-03 LAB — PROTEIN, URINE, 24 HOUR: Protein, Urine: 68 mg/dL

## 2013-03-05 ENCOUNTER — Encounter (HOSPITAL_COMMUNITY): Payer: Medicare HMO

## 2013-03-06 ENCOUNTER — Ambulatory Visit (INDEPENDENT_AMBULATORY_CARE_PROVIDER_SITE_OTHER): Payer: Medicare HMO | Admitting: Internal Medicine

## 2013-03-06 ENCOUNTER — Encounter: Payer: Self-pay | Admitting: Internal Medicine

## 2013-03-06 ENCOUNTER — Telehealth: Payer: Self-pay | Admitting: Internal Medicine

## 2013-03-06 VITALS — BP 134/100 | HR 88 | Temp 98.1°F | Ht 73.0 in | Wt 205.0 lb

## 2013-03-06 DIAGNOSIS — E1149 Type 2 diabetes mellitus with other diabetic neurological complication: Secondary | ICD-10-CM

## 2013-03-06 DIAGNOSIS — I10 Essential (primary) hypertension: Secondary | ICD-10-CM

## 2013-03-06 DIAGNOSIS — R0989 Other specified symptoms and signs involving the circulatory and respiratory systems: Secondary | ICD-10-CM

## 2013-03-06 DIAGNOSIS — E1142 Type 2 diabetes mellitus with diabetic polyneuropathy: Secondary | ICD-10-CM

## 2013-03-06 DIAGNOSIS — R809 Proteinuria, unspecified: Secondary | ICD-10-CM

## 2013-03-06 DIAGNOSIS — E785 Hyperlipidemia, unspecified: Secondary | ICD-10-CM

## 2013-03-06 MED ORDER — BENAZEPRIL HCL 5 MG PO TABS: 5.0000 mg | ORAL_TABLET | Freq: Every morning | ORAL | Status: DC

## 2013-03-06 MED ORDER — NEBIVOLOL HCL 10 MG PO TABS: 10.0000 mg | ORAL_TABLET | Freq: Every morning | ORAL | Status: DC

## 2013-03-06 MED ORDER — FUROSEMIDE 40 MG PO TABS: 20.0000 mg | ORAL_TABLET | Freq: Every day | ORAL | Status: DC

## 2013-03-06 MED ORDER — DILTIAZEM HCL ER 120 MG PO CP24: 120.0000 mg | ORAL_CAPSULE | Freq: Every day | ORAL | Status: DC

## 2013-03-06 MED ORDER — INSULIN LISPRO 100 UNIT/ML ~~LOC~~ SOLN: SUBCUTANEOUS | Status: DC

## 2013-03-06 MED ORDER — ATORVASTATIN CALCIUM 80 MG PO TABS: 80.0000 mg | ORAL_TABLET | Freq: Every day | ORAL | Status: DC
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
## 2013-03-06 NOTE — Progress Notes (Signed)
Pre visit review using our clinic review tool, if applicable. No additional management support is needed unless otherwise documented below in the visit note. 

## 2013-03-06 NOTE — Assessment & Plan Note (Signed)
We discussed at length need to count carbs and adjust meal time insulin dose accordingly.  Carb counting education material provided.

## 2013-03-06 NOTE — Patient Instructions (Addendum)
Adjust your meal time insulin as directed based upon carbohydrate intake. Bring your glucometer to your next follow up appointment. Follow up with your eye doctor for diabetic eye exam.  Let them know your A1c is 8.7 and you have diabetic polyneuropathy

## 2013-03-06 NOTE — Assessment & Plan Note (Signed)
24 hr urine shows almost 900 mg of proteinuria.  It is likely secondary combination of diabetic and hypertensive neuropathy.  Add diltiazem.  Refer to nephrology for further evaluation.

## 2013-03-06 NOTE — Telephone Encounter (Signed)
Pt cancelled renal dopplar sch for 11/13 b/c he was to be out of town.  Now dr Shawna Orleans has put in for another test, abi.  Can  You schedule both next time? RENAL ARTERY DUPLEX

## 2013-03-06 NOTE — Assessment & Plan Note (Signed)
Add diltiazem CD 120 mg.  BP: 134/100 mmHg

## 2013-03-10 NOTE — Telephone Encounter (Signed)
ABI has already been scheduled. Please let pt know he can contact [facility] directly to reschedule the appt he canceled. Pt can contact them at [phone]

## 2013-03-10 NOTE — Telephone Encounter (Signed)
This has been taken care of.

## 2013-03-12 ENCOUNTER — Encounter (HOSPITAL_COMMUNITY): Payer: Medicare HMO

## 2013-03-18 ENCOUNTER — Encounter: Payer: Self-pay | Admitting: Internal Medicine

## 2013-04-06 ENCOUNTER — Ambulatory Visit: Payer: Medicare HMO | Admitting: Internal Medicine

## 2013-04-23 DIAGNOSIS — Z9289 Personal history of other medical treatment: Secondary | ICD-10-CM

## 2013-04-23 HISTORY — DX: Personal history of other medical treatment: Z92.89

## 2013-05-11 ENCOUNTER — Ambulatory Visit (INDEPENDENT_AMBULATORY_CARE_PROVIDER_SITE_OTHER): Payer: Medicare HMO | Admitting: *Deleted

## 2013-05-11 DIAGNOSIS — I255 Ischemic cardiomyopathy: Secondary | ICD-10-CM

## 2013-05-11 DIAGNOSIS — I2589 Other forms of chronic ischemic heart disease: Secondary | ICD-10-CM

## 2013-05-11 DIAGNOSIS — I5042 Chronic combined systolic (congestive) and diastolic (congestive) heart failure: Secondary | ICD-10-CM

## 2013-05-11 LAB — MDC_IDC_ENUM_SESS_TYPE_REMOTE
Battery Remaining Longevity: 92 mo
Battery Voltage: 3.04 V
Brady Statistic RV Percent Paced: 1 %
HIGH POWER IMPEDANCE MEASURED VALUE: 39 Ohm
HighPow Impedance: 39 Ohm
Lead Channel Pacing Threshold Amplitude: 0.75 V
Lead Channel Pacing Threshold Pulse Width: 0.5 ms
Lead Channel Sensing Intrinsic Amplitude: 12 mV
Lead Channel Setting Pacing Amplitude: 2.5 V
Lead Channel Setting Pacing Pulse Width: 0.5 ms
MDC IDC MSMT BATTERY REMAINING PERCENTAGE: 92 %
MDC IDC MSMT LEADCHNL RV IMPEDANCE VALUE: 400 Ohm
MDC IDC PG SERIAL: 1109991
MDC IDC SESS DTM: 20150119134605
MDC IDC SET LEADCHNL RV SENSING SENSITIVITY: 0.5 mV
Zone Setting Detection Interval: 250 ms
Zone Setting Detection Interval: 300 ms

## 2013-05-27 ENCOUNTER — Encounter: Payer: Self-pay | Admitting: *Deleted

## 2013-06-01 ENCOUNTER — Encounter: Payer: Self-pay | Admitting: Internal Medicine

## 2013-07-02 ENCOUNTER — Encounter: Payer: Self-pay | Admitting: Family Medicine

## 2013-07-02 ENCOUNTER — Ambulatory Visit (INDEPENDENT_AMBULATORY_CARE_PROVIDER_SITE_OTHER): Payer: Medicare HMO | Admitting: Family Medicine

## 2013-07-02 VITALS — BP 132/90 | HR 90 | Temp 98.1°F | Wt 207.0 lb

## 2013-07-02 DIAGNOSIS — R609 Edema, unspecified: Secondary | ICD-10-CM

## 2013-07-02 DIAGNOSIS — R600 Localized edema: Secondary | ICD-10-CM

## 2013-07-02 DIAGNOSIS — R0609 Other forms of dyspnea: Secondary | ICD-10-CM

## 2013-07-02 DIAGNOSIS — R0989 Other specified symptoms and signs involving the circulatory and respiratory systems: Secondary | ICD-10-CM

## 2013-07-02 DIAGNOSIS — I5042 Chronic combined systolic (congestive) and diastolic (congestive) heart failure: Secondary | ICD-10-CM

## 2013-07-02 DIAGNOSIS — R0600 Dyspnea, unspecified: Secondary | ICD-10-CM

## 2013-07-02 LAB — BASIC METABOLIC PANEL
BUN: 19 mg/dL (ref 6–23)
CO2: 24 mEq/L (ref 19–32)
Calcium: 9.2 mg/dL (ref 8.4–10.5)
Chloride: 111 mEq/L (ref 96–112)
Creatinine, Ser: 1.5 mg/dL (ref 0.4–1.5)
GFR: 48.49 mL/min — AB (ref >60.00)
Glucose, Bld: 220 mg/dL — ABNORMAL HIGH (ref 70–99)
POTASSIUM: 4.1 meq/L (ref 3.5–5.1)
Sodium: 142 mEq/L (ref 135–145)

## 2013-07-02 LAB — BRAIN NATRIURETIC PEPTIDE: Pro B Natriuretic peptide (BNP): 1594 pg/mL — ABNORMAL HIGH (ref 0.0–100.0)

## 2013-07-02 LAB — TSH: TSH: 0.66 u[IU]/mL (ref 0.35–5.50)

## 2013-07-02 MED ORDER — DILTIAZEM HCL ER 120 MG PO CP24: 120.0000 mg | ORAL_CAPSULE | Freq: Every day | ORAL | Status: DC
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
## 2013-07-02 NOTE — Progress Notes (Signed)
Pre visit review using our clinic review tool, if applicable. No additional management support is needed unless otherwise documented below in the visit note. 

## 2013-07-02 NOTE — Patient Instructions (Signed)
Increase furosemide to one twice daily for 3 days then resume one daily.

## 2013-07-06 ENCOUNTER — Telehealth: Payer: Self-pay | Admitting: Internal Medicine

## 2013-07-06 NOTE — Telephone Encounter (Signed)
Pt would like blood work results °

## 2013-07-06 NOTE — Telephone Encounter (Signed)
Pt informed

## 2013-07-09 ENCOUNTER — Ambulatory Visit (INDEPENDENT_AMBULATORY_CARE_PROVIDER_SITE_OTHER): Payer: Medicare HMO | Admitting: Family Medicine

## 2013-07-09 ENCOUNTER — Encounter: Payer: Self-pay | Admitting: Family Medicine

## 2013-07-09 VITALS — BP 128/90 | HR 91 | Temp 97.9°F | Wt 197.0 lb

## 2013-07-09 DIAGNOSIS — I5042 Chronic combined systolic (congestive) and diastolic (congestive) heart failure: Secondary | ICD-10-CM
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
## 2013-07-09 NOTE — Patient Instructions (Signed)
Continue with daily weights Elevated legs frequently Continue with furosemide one daily

## 2013-07-09 NOTE — Progress Notes (Signed)
Pre visit review using our clinic review tool, if applicable. No additional management support is needed unless otherwise documented below in the visit note. 

## 2013-08-12 ENCOUNTER — Encounter: Payer: Self-pay | Admitting: Internal Medicine

## 2013-08-12 ENCOUNTER — Ambulatory Visit (INDEPENDENT_AMBULATORY_CARE_PROVIDER_SITE_OTHER): Payer: Commercial Managed Care - HMO | Admitting: *Deleted

## 2013-08-12 ENCOUNTER — Telehealth: Payer: Self-pay | Admitting: *Deleted

## 2013-08-12 DIAGNOSIS — I2589 Other forms of chronic ischemic heart disease: Secondary | ICD-10-CM

## 2013-08-12 DIAGNOSIS — I255 Ischemic cardiomyopathy: Secondary | ICD-10-CM

## 2013-08-12 DIAGNOSIS — I5042 Chronic combined systolic (congestive) and diastolic (congestive) heart failure: Secondary | ICD-10-CM

## 2013-08-12 LAB — MDC_IDC_ENUM_SESS_TYPE_REMOTE
Battery Remaining Longevity: 90 mo
Battery Remaining Percentage: 90 %
Battery Voltage: 3.04 V
Brady Statistic RV Percent Paced: 1 %
Date Time Interrogation Session: 20150422080019
HIGH POWER IMPEDANCE MEASURED VALUE: 40 Ohm
HIGH POWER IMPEDANCE MEASURED VALUE: 40 Ohm
Implantable Pulse Generator Serial Number: 1109991
Lead Channel Impedance Value: 400 Ohm
MDC IDC MSMT LEADCHNL RV PACING THRESHOLD AMPLITUDE: 0.75 V
MDC IDC MSMT LEADCHNL RV PACING THRESHOLD PULSEWIDTH: 0.5 ms
MDC IDC MSMT LEADCHNL RV SENSING INTR AMPL: 12 mV
MDC IDC SET LEADCHNL RV PACING AMPLITUDE: 2.5 V
MDC IDC SET LEADCHNL RV PACING PULSEWIDTH: 0.5 ms
MDC IDC SET LEADCHNL RV SENSING SENSITIVITY: 0.5 mV
Zone Setting Detection Interval: 250 ms
Zone Setting Detection Interval: 300 ms

## 2013-08-12 NOTE — Telephone Encounter (Signed)
Patient at office for Device Check. Has been having issues with increased SOB, dyspnea. His PCP recommended he be seen by his cardiologist as soon as possible. Patient denies emergent need to go to ED. This nurse spoke with patient in person after Device Check. Patient's coloring WNL, breathing was even and non-rapid, no wheezing audible to ear. Patient stated he "just gets out of breath walking or exerting himself'. He states he prefers to be seen by extended care provider (first available appt) instead of going to ED. He does have an appt with Dr. Percival Spanish in mid-May. Scheduled patient appt with Richardson Dopp, PA, for Tuesday, April 28th, at 8:50am. Patient advised that should he experience worsening symptoms prior to the April 28th appt, he should go to ED. Patient verbalized understanding and agreement.

## 2013-08-18 ENCOUNTER — Other Ambulatory Visit (INDEPENDENT_AMBULATORY_CARE_PROVIDER_SITE_OTHER): Payer: Commercial Managed Care - HMO

## 2013-08-18 ENCOUNTER — Encounter (INDEPENDENT_AMBULATORY_CARE_PROVIDER_SITE_OTHER): Payer: Self-pay

## 2013-08-18 ENCOUNTER — Ambulatory Visit (INDEPENDENT_AMBULATORY_CARE_PROVIDER_SITE_OTHER)
Admission: RE | Admit: 2013-08-18 | Discharge: 2013-08-18 | Disposition: A | Discharge status: Home / Self Care | Payer: Medicare HMO | Source: Ambulatory Visit | Attending: Physician Assistant | Admitting: Physician Assistant

## 2013-08-18 ENCOUNTER — Ambulatory Visit (INDEPENDENT_AMBULATORY_CARE_PROVIDER_SITE_OTHER): Payer: Commercial Managed Care - HMO | Admitting: Physician Assistant

## 2013-08-18 ENCOUNTER — Encounter: Payer: Self-pay | Admitting: Physician Assistant

## 2013-08-18 VITALS — BP 128/84 | HR 89 | Ht 73.0 in | Wt 188.0 lb

## 2013-08-18 DIAGNOSIS — I255 Ischemic cardiomyopathy: Secondary | ICD-10-CM

## 2013-08-18 DIAGNOSIS — I252 Old myocardial infarction: Secondary | ICD-10-CM

## 2013-08-18 DIAGNOSIS — I10 Essential (primary) hypertension: Secondary | ICD-10-CM

## 2013-08-18 DIAGNOSIS — R0602 Shortness of breath: Secondary | ICD-10-CM

## 2013-08-18 DIAGNOSIS — I2510 Atherosclerotic heart disease of native coronary artery without angina pectoris: Secondary | ICD-10-CM

## 2013-08-18 DIAGNOSIS — N189 Chronic kidney disease, unspecified: Secondary | ICD-10-CM

## 2013-08-18 DIAGNOSIS — I5023 Acute on chronic systolic (congestive) heart failure: Secondary | ICD-10-CM

## 2013-08-18 DIAGNOSIS — I5043 Acute on chronic combined systolic (congestive) and diastolic (congestive) heart failure: Secondary | ICD-10-CM

## 2013-08-18 DIAGNOSIS — I5042 Chronic combined systolic (congestive) and diastolic (congestive) heart failure: Secondary | ICD-10-CM

## 2013-08-18 DIAGNOSIS — E782 Mixed hyperlipidemia: Secondary | ICD-10-CM

## 2013-08-18 DIAGNOSIS — I2589 Other forms of chronic ischemic heart disease: Secondary | ICD-10-CM

## 2013-08-18 LAB — CBC WITH DIFFERENTIAL/PLATELET
Basophils Absolute: 0 10*3/uL (ref 0.0–0.1)
Basophils Relative: 0.6 % (ref 0.0–3.0)
Eosinophils Absolute: 0.2 10*3/uL (ref 0.0–0.7)
Eosinophils Relative: 3.1 % (ref 0.0–5.0)
HEMATOCRIT: 43.9 % (ref 39.0–52.0)
HEMOGLOBIN: 14.3 g/dL (ref 13.0–17.0)
LYMPHS ABS: 0.9 10*3/uL (ref 0.7–4.0)
Lymphocytes Relative: 11.9 % — ABNORMAL LOW (ref 12.0–46.0)
MCHC: 32.6 g/dL (ref 30.0–36.0)
MCV: 82.3 fl (ref 78.0–100.0)
Monocytes Absolute: 0.6 10*3/uL (ref 0.1–1.0)
Monocytes Relative: 7.7 % (ref 3.0–12.0)
NEUTROS ABS: 6.1 10*3/uL (ref 1.4–7.7)
Neutrophils Relative %: 76.7 % (ref 43.0–77.0)
Platelets: 177 10*3/uL (ref 150.0–400.0)
RBC: 5.33 Mil/uL (ref 4.22–5.81)
RDW: 16.1 % — ABNORMAL HIGH (ref 11.5–14.6)
WBC: 8 10*3/uL (ref 4.5–10.5)

## 2013-08-18 LAB — BASIC METABOLIC PANEL
BUN: 19 mg/dL (ref 6–23)
CO2: 26 meq/L (ref 19–32)
CREATININE: 1.5 mg/dL (ref 0.4–1.5)
Calcium: 9.3 mg/dL (ref 8.4–10.5)
Chloride: 105 mEq/L (ref 96–112)
GFR: 51.58 mL/min — ABNORMAL LOW (ref >60.00)
Glucose, Bld: 220 mg/dL — ABNORMAL HIGH (ref 70–99)
Potassium: 4.3 mEq/L (ref 3.5–5.1)
Sodium: 138 mEq/L (ref 135–145)

## 2013-08-18 LAB — BRAIN NATRIURETIC PEPTIDE: Pro B Natriuretic peptide (BNP): 893 pg/mL — ABNORMAL HIGH (ref 0.0–100.0)

## 2013-08-18 MED ORDER — NEBIVOLOL HCL 10 MG PO TABS: 10.0000 mg | ORAL_TABLET | Freq: Two times a day (BID) | ORAL | Status: DC

## 2013-08-18 MED ORDER — FUROSEMIDE 40 MG PO TABS: ORAL_TABLET | ORAL | Status: DC

## 2013-08-18 NOTE — Progress Notes (Signed)
[address], [place], [place]  [address] Phone: [phone] Fax:  [phone]  Date:  08/18/2013   ID:  [name] [name], DOB Apr 09, 1946, MRN [id number]  PCP:  Drema Pry, DO  Cardiologist:  Dr. Minus Breeding   Electrophysiologist:  Dr. Virl Axe    History of Present Illness: [name] [name] is a 68 y.o. male with a hx of CAD, status post out of hospital MI in 5/07, ischemic cardiomyopathy, systolic CHF, nonsustained VTach, s/p AICD, CKD. Cardiac catheterization in 2007 demonstrated severe 3 vessel CAD and MRI demonstrated nonviable tissue in the middle, apical anterior and septal walls-full-thickness scar. EF was 15-20%. He was treated medically.   Admitted in 08/2011 with acute on chronic renal failure and hyperglycemia. His ACEI was held for a short period of time.   He underwent Gen Replacement for ICD in 10/2012.  Last seen by Dr. Minus Breeding in 05/2012.  He developed URI symptoms about 6 weeks ago.  Since then, he has had increasing DOE.  He describes NYHA 2b-3 symptoms.  He denies chest pain, orthopnea, PND.  He has had increased LE edema.  He does note nausea and early satiety since URI symptoms resolved several weeks ago.  He saw his PCP and his Lasix was adjusted for a few days with some improvement.  Of note, he was placed on Cardizem several months ago for BP.     Studies:  - LHC (08/2005):  Ostial LAD 99%, mid LAD 50%, superior D1 80%, inferior D1 75%, mid OM proximal 80%, proximal PDA 25-30%, mid RCA 99%.   MRI with full thickness scar.  Med Rx.  - Echo 8/11: Mild LVH, EF 40-45%, apical HK, inferoseptal HK, grade 1 diastolic dysfunction, moderately dilated descending aorta, mild LAE, mild to moderate TR, PASP 35.  - Nuclear (01/2012):  Lg anteroapical infarct, no ischemia, EF 36%; Low Risk   Recent Labs: 10/13/2012: Hemoglobin 14.7  07/02/2013: Creatinine 1.5; Potassium 4.1; Pro B Natriuretic peptide (BNP) 1594.0*; TSH 0.66   Wt Readings from Last 3  Encounters:  08/18/13 188 lb (85.276 kg)  07/09/13 197 lb (89.359 kg)  07/02/13 207 lb (93.895 kg)     Past Medical History  Diagnosis Date  . Hypertension   . CHF (congestive heart failure)   . MI (myocardial infarction) 2007  . Pacemaker 2007  . Peripheral neuropathy   . Sleep apnea 09/06/11    "has CPAP; won't use it"  . Type II diabetes mellitus 2005  . Gout   . ICD (implantable cardiac defibrillator) in place 2007    Current Outpatient Prescriptions  Medication Sig Dispense Refill  . ADVOCATE REDI-CODE test strip       . aspirin 325 MG EC tablet Take 325 mg by mouth daily.      Marland Kitchen atorvastatin (LIPITOR) 80 MG tablet Take 1 tablet (80 mg total) by mouth at bedtime.  90 tablet  1  . benazepril (LOTENSIN) 5 MG tablet Take 1 tablet (5 mg total) by mouth every morning.  90 tablet  1  . Blood Glucose Calibration (ADVOCATE REDI-CODE+ CONTROL) LOW SOLN       . diltiazem (DILACOR XR) 120 MG 24 hr capsule Take 1 capsule (120 mg total) by mouth daily.  30 capsule  5  . Emollient (GOLD BOND MEDICATED BODY EX) Apply 1 application topically daily.      . furosemide (LASIX) 40 MG tablet Take 0.5 tablets (20 mg total) by mouth daily.  [place]  tablet  1  . insulin glargine (LANTUS) 100 UNIT/ML injection Inject 0.35 mLs (35 Units total) into the skin at bedtime.  5 pen  5  . insulin lispro (HUMALOG) 100 UNIT/ML injection Use 5-15 units 3 times daily before meals  15 mL  5  . Lancet Devices (SIMPLE DIAGNOSTICS LANCING DEV) MISC       . nebivolol (BYSTOLIC) 10 MG tablet Take 1 tablet (10 mg total) by mouth every morning.  90 tablet  1  . pantoprazole (PROTONIX) 40 MG tablet Take 40 mg by mouth daily.      Marland Kitchen PHARMACIST CHOICE LANCETS MISC        No current facility-administered medications for this visit.    Allergies:   Review of patient's allergies indicates no known allergies.   Social History:  The patient  reports that he has never smoked. He has never used smokeless tobacco. He reports that  he does not drink alcohol or use illicit drugs.   Family History:  The patient's family history includes COPD in his mother; Lung cancer in his father.   ROS:  Please see the history of present illness.   Denies vomiting.  No melena, hematochezia.   All other systems reviewed and negative.   PHYSICAL EXAM: VS:  BP 128/84  Pulse 89  Ht 6\' 1"  (1.854 m)  Wt 188 lb (85.276 kg)  BMI 24.81 kg/m2 Well nourished, well developed, in no acute distress HEENT: normal Neck: +  JVD Cardiac:  normal S1, S2; RRR; 2/6 systolic murmur at LLSB Lungs:  Decreased breath sounds bilaterally, no wheezing, rhonchi or rales Abd: soft, nontender, no hepatomegaly Ext:  1+ bilateral LE edema Skin: warm and dry Neuro:  CNs 2-12 intact, no focal abnormalities noted  EKG:  NSR< HR 89, LAD, septal Q waves, 1st degree AVB     ASSESSMENT AND PLAN:  1. Acute on chronic combined systolic and diastolic heart failure:  He has evidence of volume overload on exam.  I will adjust his Lasix further.  He will take Lasix 40 mg BID x 2 days, then 60 mg QD.  Check a BMET, CBC, BNP, CXR.  Repeat BMET in 1 week.  I will also repeat his Echo to assess LVF. 2. Ischemic cardiomyopathy:  Continue ACEI, beta blocker.  CCB is not the best choice for HTN given systolic dysfunction.  D/c Diltiazem.  Increase Bystolic to 10 mg BID.  If creatinine remains stable, consider adding Spironolactone.  3. Coronary Artery Disease:  No angina.  At this point, no further ischemic testing is indicated.  If EF is down, may need to consider stress testing vs cardiac cath.  4. Old Anterior MI (myocardial infarction):  Continue ASA, beta blocker, statin. 5. HYPERTENSION:  Controlled.  Adjust bystolic with d/c of Diltiazem.  6. HYPERLIPIDEMIA:  Continue statin. 7. CKD (chronic kidney disease):  Check BMET and repeat BMET in 1 week. 8. Disposition:  F/u with Dr. Minus Breeding 09/03/13 as planned.  Signed, Richardson Dopp, PA-C  08/18/2013 9:23 AM

## 2013-08-18 NOTE — Patient Instructions (Addendum)
STOP DILTIAZEM   INCREASE BYSTOLIC TO 10 MG TWICE DAILY; NEW RX SENT IN  INCREASE LASIX TO 40 MG TWICE DAILY FOR 2 DAYS THEN INCREASE TO 60 MG DAILY; NEW RX SENT IN  LAB WORK AND CHEST X-XRAY TODAY AT [facility]; BMET, CBC W/DIFF, BNP AND CXR  YOU WILL NEED REPEAT LAB WORK ON 08/26/13 11:30 FOR A BMET DUE TO MEDICATION CHANGES  YOU HAVE BEEN SCHEDULED FOR AN ECHO TO BE DONE 09/01/13 @ 11:30 AM  YOU HAVE A FOLLOW UP WITH DR. [facility] 09/03/13

## 2013-08-19 ENCOUNTER — Telehealth: Payer: Self-pay | Admitting: *Deleted

## 2013-08-19 NOTE — Telephone Encounter (Signed)
s/w pt's wife about cxr and lab results with verbal understanding to results, said she will let pt know the good news.

## 2013-08-25 NOTE — Progress Notes (Deleted)
ICD remote received. Sensing, impedances, auto capture thresholds consistent with previous device measurements. Histograms appropriate for patient and level of activity. 8 mode switch episodes recorded, PAC's. No ventricular arrhythmias recorded.  CorVue was abnormal for 7 days in late February.   All other diagnostic data reviewed and is appropriate and stable for patient. Real time EGM demonstrates appropriate sensing and capture. Estimated longevity 5.7 years.  Plan to check remotely in 3 months, see in office annually.  ROV in June with Dr. Sallyanne Kuster.

## 2013-08-25 NOTE — Progress Notes (Signed)
ICD remote received. Sensing, impedances, auto capture thresholds consistent with previous device measurements. Histograms appropriate for patient and level of activity.  No ventricular arrhythmias recorded.  CorVue abnormal in early April for 12 days.   All other diagnostic data reviewed and is appropriate and stable for patient. Real time EGM demonstrates appropriate sensing and capture. Estimated longevity 7.5 years.  Plan to check remotely in 3 months, see in office annually.

## 2013-08-26 ENCOUNTER — Other Ambulatory Visit (INDEPENDENT_AMBULATORY_CARE_PROVIDER_SITE_OTHER): Payer: Commercial Managed Care - HMO

## 2013-08-26 ENCOUNTER — Encounter: Payer: Self-pay | Admitting: Cardiology

## 2013-08-26 DIAGNOSIS — N189 Chronic kidney disease, unspecified: Secondary | ICD-10-CM

## 2013-08-26 LAB — BASIC METABOLIC PANEL
BUN: 25 mg/dL — ABNORMAL HIGH (ref 6–23)
CO2: 28 mEq/L (ref 19–32)
Calcium: 9 mg/dL (ref 8.4–10.5)
Chloride: 105 mEq/L (ref 96–112)
Creatinine, Ser: 1.7 mg/dL — ABNORMAL HIGH (ref 0.4–1.5)
GFR: 44.12 mL/min — ABNORMAL LOW (ref >60.00)
Glucose, Bld: 240 mg/dL — ABNORMAL HIGH (ref 70–99)
POTASSIUM: 3.8 meq/L (ref 3.5–5.1)
SODIUM: 140 meq/L (ref 135–145)

## 2013-08-27 ENCOUNTER — Telehealth: Payer: Self-pay | Admitting: *Deleted

## 2013-08-27 DIAGNOSIS — I5042 Chronic combined systolic (congestive) and diastolic (congestive) heart failure: Secondary | ICD-10-CM

## 2013-08-27 DIAGNOSIS — I10 Essential (primary) hypertension: Secondary | ICD-10-CM

## 2013-08-27 NOTE — Telephone Encounter (Signed)
pt notified about lab results. Per Brynda Rim. PA repeat bmet in 2 weeks, pt seeing Dr. Percival Spanish 5/14 and echo on 5/12.

## 2013-09-01 ENCOUNTER — Ambulatory Visit (HOSPITAL_COMMUNITY): Payer: Medicare HMO | Attending: Cardiovascular Disease | Admitting: Radiology

## 2013-09-01 ENCOUNTER — Encounter: Payer: Self-pay | Admitting: Physician Assistant

## 2013-09-01 DIAGNOSIS — I10 Essential (primary) hypertension: Secondary | ICD-10-CM | POA: Insufficient documentation

## 2013-09-01 DIAGNOSIS — I2589 Other forms of chronic ischemic heart disease: Secondary | ICD-10-CM | POA: Insufficient documentation

## 2013-09-01 DIAGNOSIS — I252 Old myocardial infarction: Secondary | ICD-10-CM | POA: Insufficient documentation

## 2013-09-01 DIAGNOSIS — I5043 Acute on chronic combined systolic (congestive) and diastolic (congestive) heart failure: Secondary | ICD-10-CM | POA: Insufficient documentation

## 2013-09-01 DIAGNOSIS — R0602 Shortness of breath: Secondary | ICD-10-CM | POA: Insufficient documentation

## 2013-09-01 DIAGNOSIS — I255 Ischemic cardiomyopathy: Secondary | ICD-10-CM

## 2013-09-01 NOTE — Progress Notes (Signed)
Echocardiogram performed by Cindy Hazy RDCS

## 2013-09-02 ENCOUNTER — Telehealth: Payer: Self-pay | Admitting: *Deleted

## 2013-09-02 NOTE — Telephone Encounter (Signed)
could reach pt through his home # or cell, calls would not go through. I then called [name], pt's daughter and she has been made aware of results and to advise that pt keep his f/u 5/14 w/Dr. Percival Spanish to echo results w/pt and to see if any further testing

## 2013-09-03 ENCOUNTER — Encounter: Payer: Self-pay | Admitting: Cardiology

## 2013-09-03 ENCOUNTER — Ambulatory Visit (INDEPENDENT_AMBULATORY_CARE_PROVIDER_SITE_OTHER): Payer: Medicare HMO | Admitting: Cardiology

## 2013-09-03 VITALS — BP 146/94 | HR 72 | Ht 73.0 in | Wt 181.8 lb

## 2013-09-03 DIAGNOSIS — I255 Ischemic cardiomyopathy: Secondary | ICD-10-CM

## 2013-09-03 DIAGNOSIS — I10 Essential (primary) hypertension: Secondary | ICD-10-CM

## 2013-09-03 DIAGNOSIS — Z79899 Other long term (current) drug therapy: Secondary | ICD-10-CM

## 2013-09-03 DIAGNOSIS — I2589 Other forms of chronic ischemic heart disease: Secondary | ICD-10-CM

## 2013-09-03 MED ORDER — BENAZEPRIL HCL 5 MG PO TABS: 5.0000 mg | ORAL_TABLET | Freq: Two times a day (BID) | ORAL | Status: DC

## 2013-09-03 NOTE — Patient Instructions (Addendum)
Please increase your benazepril to 5 mg twice a day. Continue all other medications as listed.  Please have blood work in 1 week. (BMP)  Follow up with Richardson Dopp in 2 to 3 weeks.
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
## 2013-09-09 ENCOUNTER — Other Ambulatory Visit (INDEPENDENT_AMBULATORY_CARE_PROVIDER_SITE_OTHER): Payer: Commercial Managed Care - HMO

## 2013-09-09 DIAGNOSIS — I255 Ischemic cardiomyopathy: Secondary | ICD-10-CM

## 2013-09-09 DIAGNOSIS — I2589 Other forms of chronic ischemic heart disease: Secondary | ICD-10-CM

## 2013-09-09 DIAGNOSIS — Z79899 Other long term (current) drug therapy: Secondary | ICD-10-CM

## 2013-09-09 LAB — BASIC METABOLIC PANEL
BUN: 33 mg/dL — AB (ref 6–23)
CHLORIDE: 107 meq/L (ref 96–112)
CO2: 28 meq/L (ref 19–32)
Calcium: 9.1 mg/dL (ref 8.4–10.5)
Creatinine, Ser: 1.6 mg/dL — ABNORMAL HIGH (ref 0.4–1.5)
GFR: 46.03 mL/min — AB (ref >60.00)
Glucose, Bld: 218 mg/dL — ABNORMAL HIGH (ref 70–99)
POTASSIUM: 4.2 meq/L (ref 3.5–5.1)
SODIUM: 142 meq/L (ref 135–145)

## 2013-09-23 ENCOUNTER — Encounter: Payer: Self-pay | Admitting: Physician Assistant

## 2013-09-23 ENCOUNTER — Ambulatory Visit (INDEPENDENT_AMBULATORY_CARE_PROVIDER_SITE_OTHER): Payer: Commercial Managed Care - HMO | Admitting: Physician Assistant

## 2013-09-23 ENCOUNTER — Telehealth: Payer: Self-pay | Admitting: *Deleted

## 2013-09-23 VITALS — BP 122/80 | HR 60 | Ht 73.0 in | Wt 179.0 lb

## 2013-09-23 DIAGNOSIS — I255 Ischemic cardiomyopathy: Secondary | ICD-10-CM

## 2013-09-23 DIAGNOSIS — N189 Chronic kidney disease, unspecified: Secondary | ICD-10-CM

## 2013-09-23 DIAGNOSIS — I2589 Other forms of chronic ischemic heart disease: Secondary | ICD-10-CM

## 2013-09-23 DIAGNOSIS — I252 Old myocardial infarction: Secondary | ICD-10-CM

## 2013-09-23 DIAGNOSIS — I10 Essential (primary) hypertension: Secondary | ICD-10-CM

## 2013-09-23 DIAGNOSIS — I2510 Atherosclerotic heart disease of native coronary artery without angina pectoris: Secondary | ICD-10-CM

## 2013-09-23 DIAGNOSIS — I5042 Chronic combined systolic (congestive) and diastolic (congestive) heart failure: Secondary | ICD-10-CM

## 2013-09-23 DIAGNOSIS — E782 Mixed hyperlipidemia: Secondary | ICD-10-CM

## 2013-09-23 LAB — BASIC METABOLIC PANEL
BUN: 29 mg/dL — AB (ref 6–23)
CHLORIDE: 106 meq/L (ref 96–112)
CO2: 27 meq/L (ref 19–32)
CREATININE: 1.3 mg/dL (ref 0.4–1.5)
Calcium: 9.3 mg/dL (ref 8.4–10.5)
GFR: 58.48 mL/min — ABNORMAL LOW (ref >60.00)
GLUCOSE: 200 mg/dL — AB (ref 70–99)
Potassium: 4.2 mEq/L (ref 3.5–5.1)
Sodium: 142 mEq/L (ref 135–145)

## 2013-09-23 MED ORDER — SPIRONOLACTONE 25 MG PO TABS: 12.5000 mg | ORAL_TABLET | Freq: Every day | ORAL | Status: DC

## 2013-09-23 MED ORDER — SPIRONOLACTONE 25 MG PO TABS: 25.0000 mg | ORAL_TABLET | Freq: Every day | ORAL | Status: DC

## 2013-09-23 NOTE — Patient Instructions (Signed)
START Friday 09/25/13 SPIRONOLACTONE 12.5 MG DAILY  LAB WORK TODAY BMET  REPEAT BMET 6/8 REPEAT BMET 6/15 REPEAT BMET 6/22  YOU NEED TO MAKE YOUR ANNUAL FOLLOW UP APPT WITH DR. Caryl Comes; PER SCOTT WEAVER, PAC TO DISCUSS ABOUT POSSIBLE BI-V UPGRADE  Your physician recommends that you schedule a follow-up appointment in: [place], [place] IS IN THE OFFICE

## 2013-09-23 NOTE — Telephone Encounter (Signed)
no answer. I will try again tomorrow to reach about lab results

## 2013-09-23 NOTE — Progress Notes (Signed)
Cardiology Office Note   Date:  09/23/2013   ID:  [name] [name], DOB [birth year], MRN [id number]  PCP:  Drema Pry, DO  Cardiologist:  Dr. Minus Breeding   Electrophysiologist:  Dr. Virl Axe    History of Present Illness: [name] [name] is a 68 y.o. male with a hx of CAD, status post out of hospital MI in 5/07, ischemic cardiomyopathy, systolic CHF, nonsustained VTach, s/p AICD, CKD. Cardiac catheterization in 2007 demonstrated severe 3 vessel CAD and MRI demonstrated nonviable tissue in the middle, apical anterior and septal walls-full-thickness scar. EF was 15-20%. He was treated medically.   Admitted in 08/2011 with acute on chronic renal failure and hyperglycemia. His ACEI was held for a short period of time.   He underwent Gen Replacement for ICD in 10/2012.  I saw him 08/18/13 with complaints of increasing dyspnea with exertion.  I adjusted his Lasix. I stopped his calcium channel blocker (diltiazem). I increased his beta blocker. I obtained an echocardiogram which demonstrated worsening LV function with an EF of 10-15%. He saw Dr. Percival Spanish in follow up 09/03/13. Medications for CHF were adjusted. He planned to speak with Dr. Caryl Comes about placement of an LV lead. Invasive evaluation was not planned at that time. Follow up echocardiogram would be considered in the future. He returns for follow up. His dyspnea is improved. He is NYHA class 2-2b. He denies orthopnea, PND. LE edema is improved. He denies chest discomfort or syncope.    Studies:  - LHC (08/2005):  Ostial LAD 99%, mid LAD 50%, superior D1 80%, inferior D1 75%, mid OM proximal 80%, proximal PDA 25-30%, mid RCA 99%.   MRI with full thickness scar.  Med Rx.  - Echo 8/11: Mild LVH, EF 40-45%, apical HK, inferoseptal HK, grade 1 diastolic dysfunction, moderately dilated descending aorta, mild LAE, mild to moderate TR, PASP 35.  - Echo (09/01/13):  EF 10% to 15%. Diffuse HK, mod LAE, severe RVE, mod reduced RVSF, mild RAE, PASP  53mm Hg  - Nuclear (01/2012):  Lg anteroapical infarct, no ischemia, EF 36%; Low Risk   Recent Labs: 07/02/2013: TSH 0.66  08/18/2013: Hemoglobin 14.3; Pro B Natriuretic peptide (BNP) 893.0*  09/09/2013: Creatinine 1.6*; Potassium 4.2   Wt Readings from Last 3 Encounters:  09/23/13 179 lb (81.194 kg)  09/03/13 181 lb 12.8 oz (82.464 kg)  08/18/13 188 lb (85.276 kg)     Past Medical History  Diagnosis Date  . Hypertension   . CHF (congestive heart failure)   . MI (myocardial infarction) 2007  . Pacemaker 2007  . Peripheral neuropathy   . Sleep apnea 09/06/11    "has CPAP; won't use it"  . Type II diabetes mellitus 2005  . Gout   . ICD (implantable cardiac defibrillator) in place 2007  . Hx of echocardiogram 2015    Echo (08/2013):  EF 10-15%, diff HK, mod LAE, severe RVE, mod reduced RVSF, mild RAE, PASP 41 mmHg.    Current Outpatient Prescriptions  Medication Sig Dispense Refill  . ADVOCATE REDI-CODE test strip 1 each by Other route as needed.       Marland Kitchen aspirin 325 MG EC tablet Take 325 mg by mouth daily.      Marland Kitchen atorvastatin (LIPITOR) 80 MG tablet Take 1 tablet (80 mg total) by mouth at bedtime.  90 tablet  1  . benazepril (LOTENSIN) 5 MG tablet Take 1 tablet (5 mg total) by mouth 2 (two) times daily.  60 tablet  1  .  Blood Glucose Calibration (ADVOCATE REDI-CODE+ CONTROL) LOW SOLN       . Emollient (GOLD BOND MEDICATED BODY EX) Apply 1 application topically daily.      . furosemide (LASIX) 40 MG tablet For 2 days take 40 mg twice daily then increase to 60 mg daily  60 tablet  11  . insulin glargine (LANTUS) 100 UNIT/ML injection Inject 0.35 mLs (35 Units total) into the skin at bedtime.  5 pen  5  . insulin lispro (HUMALOG) 100 UNIT/ML injection Use 5-15 units 3 times daily before meals  15 mL  5  . Lancet Devices (SIMPLE DIAGNOSTICS LANCING DEV) MISC       . nebivolol (BYSTOLIC) 10 MG tablet Take 1 tablet (10 mg total) by mouth 2 (two) times daily.  60 tablet  11  .  pantoprazole (PROTONIX) 40 MG tablet Take 40 mg by mouth daily.      Marland Kitchen PHARMACIST CHOICE LANCETS MISC        No current facility-administered medications for this visit.    Allergies:   Review of patient's allergies indicates no known allergies.   Social History:  The patient  reports that he has never smoked. He has never used smokeless tobacco. He reports that he does not drink alcohol or use illicit drugs.   Family History:  The patient's family history includes COPD in his mother; Lung cancer in his father.   ROS:  Please see the history of present illness.    All other systems reviewed and negative.   PHYSICAL EXAM: VS:  BP 122/80  Pulse 60  Ht 6\' 1"  (1.854 m)  Wt 179 lb (81.194 kg)  BMI 23.62 kg/m2 Well nourished, well developed, in no acute distress HEENT: normal Neck: no JVD Cardiac:  normal S1, S2; RRR; 1/6 systolic murmur at LLSB Lungs:  CTA bilaterally, no wheezing, rhonchi or rales Abd: soft, nontender, no hepatomegaly Ext:  trace bilateral LE edema Skin: warm and dry Neuro:  CNs 2-12 intact, no focal abnormalities noted  EKG:  NSR, HR 60, LAD, septal Q waves, no change from prior tracing   ASSESSMENT AND PLAN:  1. Chronic combined systolic and diastolic heart failure:  Volume stable.  Check BMET today.  2. Ischemic cardiomyopathy:  Continue current dose of beta blocker and ACEI.  I will add Spironolactone 12.5 mg QD. Check BMET 3 days, 10 days, 17 days after starting.  Arrange f/u with Dr. Virl Axe for annual follow up.  ? If he is a candidate for upgrade to BiV-ICD. 3. Coronary Artery Disease:  No angina.  Continue current rx. 4. Old Anterior MI (myocardial infarction):  Continue ASA, beta blocker, statin. 5. HYPERTENSION:  Controlled.    6. HYPERLIPIDEMIA:  Continue statin. 7. CKD (chronic kidney disease):  Keep close eye on renal function with adjustments in medications as noted. 8. Disposition:  F/u with Dr. Minus Breeding or me in 1  month.  Signed, Versie Starks, MHS 09/23/2013 9:22 AM    [facility] [address], [place], [place]  [address] Phone: [phone]; Fax: [phone]

## 2013-09-24 NOTE — Telephone Encounter (Signed)
pt notified about lab results with verbal understanding  

## 2013-09-28 ENCOUNTER — Other Ambulatory Visit (INDEPENDENT_AMBULATORY_CARE_PROVIDER_SITE_OTHER): Payer: Commercial Managed Care - HMO

## 2013-09-28 ENCOUNTER — Telehealth: Payer: Self-pay | Admitting: *Deleted

## 2013-09-28 DIAGNOSIS — I5042 Chronic combined systolic (congestive) and diastolic (congestive) heart failure: Secondary | ICD-10-CM

## 2013-09-28 DIAGNOSIS — N189 Chronic kidney disease, unspecified: Secondary | ICD-10-CM

## 2013-09-28 LAB — BASIC METABOLIC PANEL
BUN: 24 mg/dL — AB (ref 6–23)
CO2: 30 mEq/L (ref 19–32)
CREATININE: 1.4 mg/dL (ref 0.4–1.5)
Calcium: 9.2 mg/dL (ref 8.4–10.5)
Chloride: 105 mEq/L (ref 96–112)
GFR: 55.05 mL/min — AB (ref >60.00)
GLUCOSE: 230 mg/dL — AB (ref 70–99)
POTASSIUM: 3.7 meq/L (ref 3.5–5.1)
Sodium: 142 mEq/L (ref 135–145)

## 2013-09-28 NOTE — Telephone Encounter (Signed)
pt notified about lab results and to continue on the spironolactone , with bmet 6/15, 6/22. Pt verbalized Plan of Care

## 2013-10-05 ENCOUNTER — Other Ambulatory Visit: Payer: Commercial Managed Care - HMO

## 2013-10-05 ENCOUNTER — Encounter: Payer: Self-pay | Admitting: Physician Assistant

## 2013-10-12 ENCOUNTER — Encounter: Payer: Self-pay | Admitting: *Deleted

## 2013-10-12 ENCOUNTER — Telehealth: Payer: Self-pay | Admitting: Cardiology

## 2013-10-12 ENCOUNTER — Other Ambulatory Visit (INDEPENDENT_AMBULATORY_CARE_PROVIDER_SITE_OTHER): Payer: Commercial Managed Care - HMO

## 2013-10-12 DIAGNOSIS — I5042 Chronic combined systolic (congestive) and diastolic (congestive) heart failure: Secondary | ICD-10-CM

## 2013-10-12 DIAGNOSIS — N189 Chronic kidney disease, unspecified: Secondary | ICD-10-CM

## 2013-10-12 LAB — BASIC METABOLIC PANEL
BUN: 23 mg/dL (ref 6–23)
CALCIUM: 8.8 mg/dL (ref 8.4–10.5)
CO2: 27 meq/L (ref 19–32)
Chloride: 109 mEq/L (ref 96–112)
Creatinine, Ser: 1.3 mg/dL (ref 0.4–1.5)
GFR: 61.18 mL/min (ref >60.00)
Glucose, Bld: 222 mg/dL — ABNORMAL HIGH (ref 70–99)
Potassium: 3.8 mEq/L (ref 3.5–5.1)
Sodium: 142 mEq/L (ref 135–145)

## 2013-10-12 NOTE — Telephone Encounter (Signed)
New message  ° ° °Patient calling for test results.   °

## 2013-10-12 NOTE — Telephone Encounter (Signed)
no answer tried twice about lab results. I have taken scanned results from last week to DOD to review today, and labs from today have not yet been read.

## 2013-10-12 NOTE — Telephone Encounter (Signed)
pt notified about labs scanned in from last week, K+ normal, kidney function stable per Dr. Marlou Porch; who also reviewed today's lab results and states kidney function stable on today's lab as well.

## 2013-10-15 NOTE — Progress Notes (Signed)
Quick Note:  Ok   Continue with current treatment plan.   Patient already notified   Richardson Dopp, PA-C   10/15/2013 3:17 PM   ______

## 2013-10-19 ENCOUNTER — Encounter: Payer: Self-pay | Admitting: *Deleted

## 2013-10-26 ENCOUNTER — Encounter: Payer: Self-pay | Admitting: Physician Assistant

## 2013-10-26 ENCOUNTER — Ambulatory Visit (INDEPENDENT_AMBULATORY_CARE_PROVIDER_SITE_OTHER): Payer: Commercial Managed Care - HMO | Admitting: Physician Assistant

## 2013-10-26 VITALS — BP 138/90 | HR 84 | Ht 73.0 in | Wt 177.0 lb

## 2013-10-26 DIAGNOSIS — E782 Mixed hyperlipidemia: Secondary | ICD-10-CM

## 2013-10-26 DIAGNOSIS — I2510 Atherosclerotic heart disease of native coronary artery without angina pectoris: Secondary | ICD-10-CM

## 2013-10-26 DIAGNOSIS — I10 Essential (primary) hypertension: Secondary | ICD-10-CM

## 2013-10-26 DIAGNOSIS — I5042 Chronic combined systolic (congestive) and diastolic (congestive) heart failure: Secondary | ICD-10-CM

## 2013-10-26 DIAGNOSIS — I255 Ischemic cardiomyopathy: Secondary | ICD-10-CM

## 2013-10-26 DIAGNOSIS — N189 Chronic kidney disease, unspecified: Secondary | ICD-10-CM

## 2013-10-26 DIAGNOSIS — I2589 Other forms of chronic ischemic heart disease: Secondary | ICD-10-CM

## 2013-10-26 MED ORDER — SPIRONOLACTONE 25 MG PO TABS: 25.0000 mg | ORAL_TABLET | Freq: Every day | ORAL | Status: DC

## 2013-10-26 NOTE — Progress Notes (Signed)
Cardiology Office Note   Date:  10/26/2013   ID:  [name] [name], DOB Sep 28, 1945, MRN [id number]  PCP:  Drema Pry, DO  Cardiologist:  Dr. Minus Breeding   Electrophysiologist:  Dr. Virl Axe     History of Present Illness: [name] [name] is a 68 y.o. male with a hx of CAD, status post out of hospital MI in 5/07, ischemic cardiomyopathy, systolic CHF, nonsustained VTach, s/p AICD, CKD. Cardiac catheterization in 2007 demonstrated severe 3 vessel CAD and MRI demonstrated nonviable tissue in the middle, apical anterior and septal walls-full-thickness scar. EF was 15-20%. Medical Rx was recommended.   He underwent Gen Replacement for ICD in 10/2012.  I saw him 07/2013 with complaints of increasing dyspnea with exertion.  I adjusted his Lasix and beta blocker and stopped his calcium channel blocker (diltiazem). Echocardiogram demonstrated worsening LV function with an EF of 10-15%.  I saw him last month and added Spironolactone.  He returns for further medication titration.  He is doing well.  The patient denies chest pain, significant shortness of breath, syncope, orthopnea, PND or significant pedal edema. Tolerating all medications ok.    Studies:  - LHC (08/2005):  Ostial LAD 99%, mid LAD 50%, superior D1 80%, inferior D1 75%, mid OM proximal 80%, proximal PDA 25-30%, mid RCA 99%.   MRI with full thickness scar.  Med Rx.  - Echo 8/11: Mild LVH, EF 40-45%, apical HK, inferoseptal HK, grade 1 diastolic dysfunction, moderately dilated descending aorta, mild LAE, mild to moderate TR, PASP 35.  - Echo (09/01/13):  EF 10% to 15%. Diffuse HK, mod LAE, severe RVE, mod reduced RVSF, mild RAE, PASP 36mm Hg  - Nuclear (01/2012):  Lg anteroapical infarct, no ischemia, EF 36%; Low Risk   Recent Labs: 07/02/2013: TSH 0.66  08/18/2013: Hemoglobin 14.3; Pro B Natriuretic peptide (BNP) 893.0*  10/12/2013: Creatinine 1.3; Potassium 3.8   Wt Readings from Last 3 Encounters:  10/26/13 177 lb (80.287  kg)  09/23/13 179 lb (81.194 kg)  09/03/13 181 lb 12.8 oz (82.464 kg)     Past Medical History  Diagnosis Date  . Hypertension   . Chronic combined systolic and diastolic heart failure   . History of MI (myocardial infarction) 2007    out of hosp MI - LHC with 3v CAD rx medically  . Peripheral neuropathy   . Sleep apnea 09/06/11    "has CPAP; won't use it"  . Type II diabetes mellitus 2005  . Gout   . ICD (implantable cardiac defibrillator) in place 2007    gen change 10/2012  . Hx of echocardiogram 2015    Echo (08/2013):  EF 10-15%, diff HK, mod LAE, severe RVE, mod reduced RVSF, mild RAE, PASP 41 mmHg.  . Ischemic cardiomyopathy     Echo (8/11):  EF 40-45%;  Echo (5/15):  EF 10-15%  . CAD (coronary artery disease)     a. LHC (08/2005):  Ostial LAD 99%, mid LAD 50%, superior D1 80%, inferior D1 75%, mid OM proximal 80%, proximal PDA 25-30%, mid RCA 99%.   MRI with full thickness scar.  Med Rx  . NSVT (nonsustained ventricular tachycardia)     s/p ICD  . CKD (chronic kidney disease)     Current Outpatient Prescriptions  Medication Sig Dispense Refill  . ADVOCATE REDI-CODE test strip 1 each by Other route as needed.       Marland Kitchen aspirin 325 MG EC tablet Take 325 mg by mouth daily.      Marland Kitchen  atorvastatin (LIPITOR) 80 MG tablet Take 1 tablet (80 mg total) by mouth at bedtime.  90 tablet  1  . benazepril (LOTENSIN) 5 MG tablet Take 1 tablet (5 mg total) by mouth 2 (two) times daily.  60 tablet  1  . Blood Glucose Calibration (ADVOCATE REDI-CODE+ CONTROL) LOW SOLN       . Emollient (GOLD BOND MEDICATED BODY EX) Apply 1 application topically daily.      . furosemide (LASIX) 40 MG tablet For 2 days take 40 mg twice daily then increase to 60 mg daily  60 tablet  11  . insulin glargine (LANTUS) 100 UNIT/ML injection Inject 0.35 mLs (35 Units total) into the skin at bedtime.  5 pen  5  . insulin lispro (HUMALOG) 100 UNIT/ML injection Use 5-15 units 3 times daily before meals  15 mL  5  . Lancet  Devices (SIMPLE DIAGNOSTICS LANCING DEV) MISC       . nebivolol (BYSTOLIC) 10 MG tablet Take 1 tablet (10 mg total) by mouth 2 (two) times daily.  60 tablet  11  . pantoprazole (PROTONIX) 40 MG tablet Take 40 mg by mouth daily.      Marland Kitchen PHARMACIST CHOICE LANCETS MISC       . spironolactone (ALDACTONE) 25 MG tablet Take 0.5 tablets (12.5 mg total) by mouth daily.  30 tablet  11   No current facility-administered medications for this visit.    Allergies:   Review of patient's allergies indicates no known allergies.   Social History:  The patient  reports that he has never smoked. He has never used smokeless tobacco. He reports that he does not drink alcohol or use illicit drugs.   Family History:  The patient's family history includes COPD in his mother; Lung cancer in his father.   ROS:  Please see the history of present illness.    All other systems reviewed and negative.   PHYSICAL EXAM: VS:  BP 138/90  Pulse 84  Ht 6\' 1"  (1.854 m)  Wt 177 lb (80.287 kg)  BMI 23.36 kg/m2 Well nourished, well developed, in no acute distress HEENT: normal Neck: no JVD Cardiac:  normal S1, S2; RRR; 1/6 systolic murmur at LLSB Lungs:  CTA bilaterally, no wheezing, rhonchi or rales Abd: soft, nontender, no hepatomegaly Ext:  no LE edema Skin: warm and dry Neuro:  CNs 2-12 intact, no focal abnormalities noted  EKG:  NSR, HR 84, normal axis, lateral Q waves, poor R wave progression, nonspecific ST-T wave changes   ASSESSMENT AND PLAN:  1. Chronic combined systolic and diastolic heart failure:   Volume stable.   2. Ischemic cardiomyopathy:   Continue current dose of beta blocker and ACEI.  Increase Spironolactone to 25 QD.  Check BMET in 5-7 days. 3. Coronary Artery Disease:  No angina.  Continue current rx. 4. HYPERTENSION:  Controlled.    5. HYPERLIPIDEMIA:  Continue statin. 6. CKD (chronic kidney disease):  Check BMET in 5-7 days with adjustment in Spironolactone. 7. Disposition:  F/u with Dr.  Virl Axe as planned to discuss possible upgrade to CRT-D and Dr. Minus Breeding in 11/2013 (will defer timing of repeat echo to Dr. Percival Spanish).   Signed, Versie Starks, MHS 10/26/2013 10:23 AM    [facility] [address], [place], [place]  [address] Phone: [phone]; Fax: [phone]

## 2013-10-26 NOTE — Patient Instructions (Addendum)
Increase Spironolactone to 25 mg daily.  Return for labwork in 5-7 days (BMET).  Keep follow up with Dr. Virl Axe on 11/04/13.  Follow up with Dr. Minus Breeding in August, 2015

## 2013-11-03 ENCOUNTER — Encounter: Payer: Self-pay | Admitting: Gastroenterology

## 2013-11-04 ENCOUNTER — Encounter: Payer: Self-pay | Admitting: Internal Medicine

## 2013-11-04 ENCOUNTER — Other Ambulatory Visit: Payer: Commercial Managed Care - HMO

## 2013-11-04 ENCOUNTER — Ambulatory Visit (INDEPENDENT_AMBULATORY_CARE_PROVIDER_SITE_OTHER): Payer: Commercial Managed Care - HMO | Admitting: Internal Medicine

## 2013-11-04 VITALS — BP 136/85 | HR 71 | Ht 73.0 in | Wt 175.0 lb

## 2013-11-04 DIAGNOSIS — I4729 Other ventricular tachycardia: Secondary | ICD-10-CM

## 2013-11-04 DIAGNOSIS — Z95810 Presence of automatic (implantable) cardiac defibrillator: Secondary | ICD-10-CM | POA: Diagnosis not present

## 2013-11-04 DIAGNOSIS — I472 Ventricular tachycardia, unspecified: Secondary | ICD-10-CM | POA: Diagnosis not present

## 2013-11-04 DIAGNOSIS — I2589 Other forms of chronic ischemic heart disease: Secondary | ICD-10-CM | POA: Diagnosis not present

## 2013-11-04 DIAGNOSIS — I5042 Chronic combined systolic (congestive) and diastolic (congestive) heart failure: Secondary | ICD-10-CM | POA: Diagnosis not present

## 2013-11-04 DIAGNOSIS — I252 Old myocardial infarction: Secondary | ICD-10-CM

## 2013-11-04 DIAGNOSIS — N189 Chronic kidney disease, unspecified: Secondary | ICD-10-CM

## 2013-11-04 DIAGNOSIS — I255 Ischemic cardiomyopathy: Secondary | ICD-10-CM

## 2013-11-04 LAB — BASIC METABOLIC PANEL
BUN: 25 mg/dL — AB (ref 6–23)
CALCIUM: 9.4 mg/dL (ref 8.4–10.5)
CO2: 29 mEq/L (ref 19–32)
Chloride: 102 mEq/L (ref 96–112)
Creatinine, Ser: 1.5 mg/dL (ref 0.4–1.5)
GFR: 50.34 mL/min — ABNORMAL LOW (ref >60.00)
Glucose, Bld: 252 mg/dL — ABNORMAL HIGH (ref 70–99)
POTASSIUM: 4 meq/L (ref 3.5–5.1)
Sodium: 137 mEq/L (ref 135–145)

## 2013-11-04 LAB — MDC_IDC_ENUM_SESS_TYPE_INCLINIC
Battery Remaining Longevity: 88.8 mo
Brady Statistic RV Percent Paced: 0.01 %
Date Time Interrogation Session: 20150715123427
HighPow Impedance: 48 Ohm
Implantable Pulse Generator Serial Number: 1109991
Lead Channel Pacing Threshold Amplitude: 0.75 V
Lead Channel Pacing Threshold Pulse Width: 0.5 ms
Lead Channel Pacing Threshold Pulse Width: 0.5 ms
Lead Channel Sensing Intrinsic Amplitude: 12 mV
Lead Channel Setting Pacing Amplitude: 2.5 V
Lead Channel Setting Sensing Sensitivity: 0.5 mV
MDC IDC MSMT LEADCHNL RV IMPEDANCE VALUE: 512.5 Ohm
MDC IDC MSMT LEADCHNL RV PACING THRESHOLD AMPLITUDE: 0.75 V
MDC IDC SET LEADCHNL RV PACING PULSEWIDTH: 0.5 ms
MDC IDC SET ZONE DETECTION INTERVAL: 250 ms
Zone Setting Detection Interval: 300 ms

## 2013-11-04 NOTE — Assessment & Plan Note (Signed)
His heart failure is largely systolic. He is euvolemic. Continue current medications.

## 2013-11-04 NOTE — Assessment & Plan Note (Signed)
We will decrease his aspirin from 325--81 mg

## 2013-11-04 NOTE — Assessment & Plan Note (Signed)
The patient's device was interrogated.  The information was reviewed. No changes were made in the programming.    

## 2013-11-04 NOTE — Patient Instructions (Signed)
Your physician has recommended you make the following change in your medication:  1) DECREASE Aspirin to 81 mg daily  Lab today: BMET  Your physician has requested that you have an echocardiogram before 11/30/13. Echocardiography is a painless test that uses sound waves to create images of your heart. It provides your doctor with information about the size and shape of your heart and how well your heart's chambers and valves are working. This procedure takes approximately one hour. There are no restrictions for this procedure.  Remote monitoring is used to monitor your Pacemaker of ICD from home. This monitoring reduces the number of office visits required to check your device to one time per year. It allows [place] to keep an eye on the functioning of your device to ensure it is working properly. You are scheduled for a device check from home on 02/03/14. You may send your transmission at any time that day. If you have a wireless device, the transmission will be sent automatically. After your physician reviews your transmission, you will receive a postcard with your next transmission date.  Your physician wants you to follow-up in: 1 year with Dr. Caryl Comes.  You will receive a reminder letter in the mail two months in advance. If you don't receive a letter, please call our office to schedule the follow-up appointment.
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
## 2013-11-04 NOTE — Assessment & Plan Note (Signed)
Significant interval worsening. Will plan to reassess prior to his seeing Dr. [facility]. In the event that remains significantly different from his last assessment 10/14 I would recommend considering catheterization. For now continue current medications

## 2013-11-05 ENCOUNTER — Telehealth: Payer: Self-pay | Admitting: *Deleted

## 2013-11-05 NOTE — Telephone Encounter (Signed)
no answer will try again later, cell # disconnected

## 2013-11-05 NOTE — Telephone Encounter (Signed)
x 2 no answer

## 2013-11-05 NOTE — Telephone Encounter (Signed)
pt notified about lab results with verbal understanding. Repeat bmet 11/20/13.

## 2013-11-13 ENCOUNTER — Other Ambulatory Visit (HOSPITAL_COMMUNITY): Payer: Commercial Managed Care - HMO

## 2013-11-20 ENCOUNTER — Other Ambulatory Visit (INDEPENDENT_AMBULATORY_CARE_PROVIDER_SITE_OTHER): Payer: Commercial Managed Care - HMO

## 2013-11-20 ENCOUNTER — Telehealth: Payer: Self-pay | Admitting: *Deleted

## 2013-11-20 DIAGNOSIS — I5042 Chronic combined systolic (congestive) and diastolic (congestive) heart failure: Secondary | ICD-10-CM

## 2013-11-20 DIAGNOSIS — I10 Essential (primary) hypertension: Secondary | ICD-10-CM

## 2013-11-20 LAB — BASIC METABOLIC PANEL
BUN: 34 mg/dL — ABNORMAL HIGH (ref 6–23)
CHLORIDE: 108 meq/L (ref 96–112)
CO2: 29 mEq/L (ref 19–32)
Calcium: 9.3 mg/dL (ref 8.4–10.5)
Creatinine, Ser: 1.7 mg/dL — ABNORMAL HIGH (ref 0.4–1.5)
GFR: 42.32 mL/min — ABNORMAL LOW (ref >60.00)
GLUCOSE: 203 mg/dL — AB (ref 70–99)
POTASSIUM: 4.1 meq/L (ref 3.5–5.1)
Sodium: 142 mEq/L (ref 135–145)

## 2013-11-20 NOTE — Telephone Encounter (Signed)
pt notified about lab results today. Pt has a f/u w/Dr. Percival Spanish 8/11. Advised Dr. Percival Spanish may just repeat lab then, pt said ok and thank you.

## 2013-11-25 ENCOUNTER — Ambulatory Visit (HOSPITAL_COMMUNITY): Payer: Medicare HMO | Attending: Cardiovascular Disease | Admitting: Radiology

## 2013-11-25 DIAGNOSIS — I472 Ventricular tachycardia: Secondary | ICD-10-CM

## 2013-11-25 DIAGNOSIS — I255 Ischemic cardiomyopathy: Secondary | ICD-10-CM

## 2013-11-25 DIAGNOSIS — I4729 Other ventricular tachycardia: Secondary | ICD-10-CM

## 2013-11-25 DIAGNOSIS — I2589 Other forms of chronic ischemic heart disease: Secondary | ICD-10-CM | POA: Insufficient documentation

## 2013-11-25 DIAGNOSIS — I5042 Chronic combined systolic (congestive) and diastolic (congestive) heart failure: Secondary | ICD-10-CM

## 2013-11-25 DIAGNOSIS — Z95810 Presence of automatic (implantable) cardiac defibrillator: Secondary | ICD-10-CM

## 2013-11-25 NOTE — Progress Notes (Signed)
Echocardiogram performed.  

## 2013-12-01 ENCOUNTER — Encounter: Payer: Self-pay | Admitting: Cardiology

## 2013-12-01 ENCOUNTER — Ambulatory Visit (INDEPENDENT_AMBULATORY_CARE_PROVIDER_SITE_OTHER): Payer: Commercial Managed Care - HMO | Admitting: Cardiology

## 2013-12-01 ENCOUNTER — Telehealth: Payer: Self-pay | Admitting: Cardiology

## 2013-12-01 ENCOUNTER — Other Ambulatory Visit: Payer: Self-pay | Admitting: *Deleted

## 2013-12-01 VITALS — BP 137/82 | HR 59 | Ht 73.0 in | Wt 174.8 lb

## 2013-12-01 DIAGNOSIS — I341 Nonrheumatic mitral (valve) prolapse: Secondary | ICD-10-CM

## 2013-12-01 DIAGNOSIS — E782 Mixed hyperlipidemia: Secondary | ICD-10-CM

## 2013-12-01 DIAGNOSIS — I059 Rheumatic mitral valve disease, unspecified: Secondary | ICD-10-CM

## 2013-12-01 DIAGNOSIS — R0602 Shortness of breath: Secondary | ICD-10-CM

## 2013-12-01 DIAGNOSIS — R5383 Other fatigue: Secondary | ICD-10-CM

## 2013-12-01 DIAGNOSIS — R5381 Other malaise: Secondary | ICD-10-CM

## 2013-12-01 NOTE — Patient Instructions (Signed)
Your physician recommends that you schedule a follow-up appointment in: after your heart cath  We are scheduling  Heart cath

## 2013-12-01 NOTE — Telephone Encounter (Signed)
Spoke with patient regarding procedure that was ordered by Dr. Percival Spanish.  It is scheduled for 12/08/13 with Dr. Angelena Form at 10:00 am.  He is to arrive at the short stay center at [facility] at 8:00 am---NPO after midnight.  Instruction letter will be mailed to patient.  He voiced his understanding.
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
## 2013-12-04 LAB — LIPID PANEL
Cholesterol: 204 mg/dL — ABNORMAL HIGH (ref 0–200)
HDL: 43 mg/dL (ref >39)
LDL Cholesterol: 137 mg/dL — ABNORMAL HIGH (ref 0–99)
TRIGLYCERIDES: 122 mg/dL (ref <150)
Total CHOL/HDL Ratio: 4.7 Ratio
VLDL: 24 mg/dL (ref 0–40)

## 2013-12-04 LAB — CBC
HCT: 42.5 % (ref 39.0–52.0)
HEMOGLOBIN: 14.2 g/dL (ref 13.0–17.0)
MCH: 28.3 pg (ref 26.0–34.0)
MCHC: 33.4 g/dL (ref 30.0–36.0)
MCV: 84.7 fL (ref 78.0–100.0)
Platelets: 182 10*3/uL (ref 150–400)
RBC: 5.02 MIL/uL (ref 4.22–5.81)
RDW: 14.9 % (ref 11.5–15.5)
WBC: 7.8 10*3/uL (ref 4.0–10.5)

## 2013-12-04 LAB — BASIC METABOLIC PANEL
BUN: 38 mg/dL — ABNORMAL HIGH (ref 6–23)
CALCIUM: 9.1 mg/dL (ref 8.4–10.5)
CHLORIDE: 105 meq/L (ref 96–112)
CO2: 29 meq/L (ref 19–32)
CREATININE: 1.71 mg/dL — AB (ref 0.50–1.35)
GLUCOSE: 165 mg/dL — AB (ref 70–99)
Potassium: 4.9 mEq/L (ref 3.5–5.3)
Sodium: 142 mEq/L (ref 135–145)

## 2013-12-07 ENCOUNTER — Other Ambulatory Visit: Payer: Self-pay | Admitting: *Deleted

## 2013-12-07 ENCOUNTER — Encounter (HOSPITAL_COMMUNITY): Payer: Self-pay | Admitting: Pharmacy Technician

## 2013-12-07 DIAGNOSIS — Z01810 Encounter for preprocedural cardiovascular examination: Secondary | ICD-10-CM

## 2013-12-07 DIAGNOSIS — D689 Coagulation defect, unspecified: Secondary | ICD-10-CM

## 2013-12-09 ENCOUNTER — Ambulatory Visit (HOSPITAL_COMMUNITY)
Admission: RE | Admit: 2013-12-09 | Discharge: 2013-12-10 | Disposition: A | Discharge status: Home / Self Care | Payer: Medicare HMO | Source: Ambulatory Visit | Attending: Cardiovascular Disease | Admitting: Cardiovascular Disease

## 2013-12-09 ENCOUNTER — Encounter (HOSPITAL_COMMUNITY)
Admission: RE | Disposition: A | Discharge status: Home / Self Care | Payer: Self-pay | Source: Ambulatory Visit | Attending: Cardiovascular Disease

## 2013-12-09 ENCOUNTER — Encounter (HOSPITAL_COMMUNITY): Payer: Self-pay | Admitting: General Practice

## 2013-12-09 DIAGNOSIS — Z95810 Presence of automatic (implantable) cardiac defibrillator: Secondary | ICD-10-CM | POA: Diagnosis not present

## 2013-12-09 DIAGNOSIS — I129 Hypertensive chronic kidney disease with stage 1 through stage 4 chronic kidney disease, or unspecified chronic kidney disease: Secondary | ICD-10-CM | POA: Insufficient documentation

## 2013-12-09 DIAGNOSIS — I472 Ventricular tachycardia, unspecified: Secondary | ICD-10-CM | POA: Insufficient documentation

## 2013-12-09 DIAGNOSIS — N183 Chronic kidney disease, stage 3 unspecified: Secondary | ICD-10-CM | POA: Diagnosis present

## 2013-12-09 DIAGNOSIS — G4730 Sleep apnea, unspecified: Secondary | ICD-10-CM | POA: Insufficient documentation

## 2013-12-09 DIAGNOSIS — I341 Nonrheumatic mitral (valve) prolapse: Secondary | ICD-10-CM | POA: Diagnosis present

## 2013-12-09 DIAGNOSIS — I4729 Other ventricular tachycardia: Secondary | ICD-10-CM | POA: Diagnosis not present

## 2013-12-09 DIAGNOSIS — I255 Ischemic cardiomyopathy: Secondary | ICD-10-CM | POA: Diagnosis present

## 2013-12-09 DIAGNOSIS — I059 Rheumatic mitral valve disease, unspecified: Secondary | ICD-10-CM | POA: Diagnosis not present

## 2013-12-09 DIAGNOSIS — I2510 Atherosclerotic heart disease of native coronary artery without angina pectoris: Secondary | ICD-10-CM

## 2013-12-09 DIAGNOSIS — Z79899 Other long term (current) drug therapy: Secondary | ICD-10-CM | POA: Diagnosis not present

## 2013-12-09 DIAGNOSIS — Z794 Long term (current) use of insulin: Secondary | ICD-10-CM | POA: Diagnosis not present

## 2013-12-09 DIAGNOSIS — G609 Hereditary and idiopathic neuropathy, unspecified: Secondary | ICD-10-CM | POA: Insufficient documentation

## 2013-12-09 DIAGNOSIS — I10 Essential (primary) hypertension: Secondary | ICD-10-CM

## 2013-12-09 DIAGNOSIS — I252 Old myocardial infarction: Secondary | ICD-10-CM | POA: Diagnosis not present

## 2013-12-09 DIAGNOSIS — I2589 Other forms of chronic ischemic heart disease: Secondary | ICD-10-CM | POA: Insufficient documentation

## 2013-12-09 DIAGNOSIS — M109 Gout, unspecified: Secondary | ICD-10-CM | POA: Diagnosis not present

## 2013-12-09 DIAGNOSIS — E1129 Type 2 diabetes mellitus with other diabetic kidney complication: Secondary | ICD-10-CM | POA: Insufficient documentation

## 2013-12-09 DIAGNOSIS — E785 Hyperlipidemia, unspecified: Secondary | ICD-10-CM | POA: Diagnosis not present

## 2013-12-09 DIAGNOSIS — I5042 Chronic combined systolic (congestive) and diastolic (congestive) heart failure: Secondary | ICD-10-CM | POA: Diagnosis not present

## 2013-12-09 DIAGNOSIS — E1121 Type 2 diabetes mellitus with diabetic nephropathy: Secondary | ICD-10-CM | POA: Diagnosis present

## 2013-12-09 DIAGNOSIS — Z01810 Encounter for preprocedural cardiovascular examination: Secondary | ICD-10-CM

## 2013-12-09 HISTORY — DX: Presence of automatic (implantable) cardiac defibrillator: Z95.810

## 2013-12-09 HISTORY — DX: Acute myocardial infarction, unspecified: I21.9

## 2013-12-09 HISTORY — DX: Cerebral infarction, unspecified: I63.9

## 2013-12-09 HISTORY — DX: Atherosclerotic heart disease of native coronary artery without angina pectoris: I25.10

## 2013-12-09 HISTORY — PX: LEFT AND RIGHT HEART CATHETERIZATION WITH CORONARY ANGIOGRAM: SHX5449

## 2013-12-09 HISTORY — PX: CARDIAC CATHETERIZATION: SHX172

## 2013-12-09 LAB — BASIC METABOLIC PANEL
ANION GAP: 13 (ref 5–15)
BUN: 28 mg/dL — ABNORMAL HIGH (ref 6–23)
CHLORIDE: 105 meq/L (ref 96–112)
CO2: 28 mEq/L (ref 19–32)
Calcium: 9.4 mg/dL (ref 8.4–10.5)
Creatinine, Ser: 1.75 mg/dL — ABNORMAL HIGH (ref 0.50–1.35)
GFR calc non Af Amer: 39 mL/min — ABNORMAL LOW (ref >90)
GFR, EST AFRICAN AMERICAN: 45 mL/min — AB (ref >90)
Glucose, Bld: 79 mg/dL (ref 70–99)
POTASSIUM: 4 meq/L (ref 3.7–5.3)
Sodium: 146 mEq/L (ref 137–147)

## 2013-12-09 LAB — GLUCOSE, CAPILLARY
GLUCOSE-CAPILLARY: 296 mg/dL — AB (ref 70–99)
GLUCOSE-CAPILLARY: 157 mg/dL — AB (ref 70–99)
Glucose-Capillary: 70 mg/dL (ref 70–99)
Glucose-Capillary: 76 mg/dL (ref 70–99)

## 2013-12-09 LAB — POCT I-STAT 3, VENOUS BLOOD GAS (G3P V)
Acid-Base Excess: 1 mmol/L (ref 0.0–2.0)
Bicarbonate: 27 mEq/L — ABNORMAL HIGH (ref 20.0–24.0)
O2 Saturation: 71 %
PH VEN: 7.362 — AB (ref 7.250–7.300)
TCO2: 28 mmol/L (ref 0–100)
pCO2, Ven: 47.5 mmHg (ref 45.0–50.0)
pO2, Ven: 39 mmHg (ref 30.0–45.0)

## 2013-12-09 LAB — PROTIME-INR
INR: 0.99 (ref 0.00–1.49)
PROTHROMBIN TIME: 13.1 s (ref 11.6–15.2)

## 2013-12-09 LAB — POCT I-STAT 3, ART BLOOD GAS (G3+)
Acid-Base Excess: 1 mmol/L (ref 0.0–2.0)
Bicarbonate: 26.5 mEq/L — ABNORMAL HIGH (ref 20.0–24.0)
O2 Saturation: 93 %
PO2 ART: 65 mmHg — AB (ref 80.0–100.0)
TCO2: 28 mmol/L (ref 0–100)
pCO2 arterial: 41.6 mmHg (ref 35.0–45.0)
pH, Arterial: 7.409 (ref 7.350–7.450)

## 2013-12-09 SURGERY — LEFT AND RIGHT HEART CATHETERIZATION WITH CORONARY ANGIOGRAM
Anesthesia: LOCAL

## 2013-12-09 MED ORDER — FUROSEMIDE 40 MG PO TABS
60.0000 mg | ORAL_TABLET | Freq: Every day | ORAL | Status: DC
  Administered 2013-12-10: 10:00:00 60 mg via ORAL
  Filled 2013-12-09: qty 1

## 2013-12-09 MED ORDER — ASPIRIN 81 MG PO CHEW: 81.0000 mg | CHEWABLE_TABLET | ORAL | Status: DC

## 2013-12-09 MED ORDER — SODIUM CHLORIDE 0.9 % IJ SOLN: 3.0000 mL | INTRAMUSCULAR | Status: DC | PRN

## 2013-12-09 MED ORDER — INSULIN ASPART 100 UNIT/ML ~~LOC~~ SOLN
5.0000 [IU] | Freq: Three times a day (TID) | SUBCUTANEOUS | Status: DC
  Administered 2013-12-09 – 2013-12-10 (×2): 5 [IU] via SUBCUTANEOUS

## 2013-12-09 MED ORDER — PANTOPRAZOLE SODIUM 40 MG PO TBEC
40.0000 mg | DELAYED_RELEASE_TABLET | Freq: Every day | ORAL | Status: DC
  Administered 2013-12-10: 40 mg via ORAL
  Filled 2013-12-09: qty 1

## 2013-12-09 MED ORDER — HEPARIN (PORCINE) IN NACL 2-0.9 UNIT/ML-% IJ SOLN
INTRAMUSCULAR | Status: AC
  Filled 2013-12-09: qty 1000

## 2013-12-09 MED ORDER — SODIUM CHLORIDE 0.9 % IV SOLN: INTRAVENOUS | Status: DC

## 2013-12-09 MED ORDER — MIDAZOLAM HCL 2 MG/2ML IJ SOLN
INTRAMUSCULAR | Status: AC
  Filled 2013-12-09: qty 2

## 2013-12-09 MED ORDER — ATORVASTATIN CALCIUM 80 MG PO TABS
80.0000 mg | ORAL_TABLET | Freq: Every day | ORAL | Status: DC
  Administered 2013-12-09: 22:00:00 80 mg via ORAL
  Filled 2013-12-09 (×2): qty 1

## 2013-12-09 MED ORDER — NEBIVOLOL HCL 10 MG PO TABS
10.0000 mg | ORAL_TABLET | Freq: Two times a day (BID) | ORAL | Status: DC
  Administered 2013-12-10 (×2): 10 mg via ORAL
  Filled 2013-12-09 (×3): qty 1

## 2013-12-09 MED ORDER — SODIUM CHLORIDE 0.9 % IV SOLN: INTRAVENOUS | Status: AC

## 2013-12-09 MED ORDER — ASPIRIN EC 81 MG PO TBEC
81.0000 mg | DELAYED_RELEASE_TABLET | Freq: Every day | ORAL | Status: DC
  Administered 2013-12-10: 81 mg via ORAL
  Filled 2013-12-09: qty 1

## 2013-12-09 MED ORDER — SPIRONOLACTONE 25 MG PO TABS
25.0000 mg | ORAL_TABLET | Freq: Every day | ORAL | Status: DC
  Administered 2013-12-09 – 2013-12-10 (×2): 25 mg via ORAL
  Filled 2013-12-09 (×2): qty 1

## 2013-12-09 MED ORDER — VERAPAMIL HCL 2.5 MG/ML IV SOLN
INTRAVENOUS | Status: AC
  Filled 2013-12-09: qty 2

## 2013-12-09 MED ORDER — ACETAMINOPHEN 325 MG PO TABS: 650.0000 mg | ORAL_TABLET | ORAL | Status: DC | PRN

## 2013-12-09 MED ORDER — FENTANYL CITRATE 0.05 MG/ML IJ SOLN
INTRAMUSCULAR | Status: AC
  Filled 2013-12-09: qty 2

## 2013-12-09 MED ORDER — ONDANSETRON HCL 4 MG/2ML IJ SOLN
4.0000 mg | Freq: Four times a day (QID) | INTRAMUSCULAR | Status: DC | PRN
Start: 2013-12-09 — End: 2013-12-10

## 2013-12-09 MED ORDER — HEPARIN SODIUM (PORCINE) 1000 UNIT/ML IJ SOLN
INTRAMUSCULAR | Status: AC
  Filled 2013-12-09: qty 1

## 2013-12-09 MED ORDER — LIDOCAINE HCL (PF) 1 % IJ SOLN
INTRAMUSCULAR | Status: AC
  Filled 2013-12-09: qty 30

## 2013-12-09 MED ORDER — BENAZEPRIL HCL 5 MG PO TABS
5.0000 mg | ORAL_TABLET | Freq: Two times a day (BID) | ORAL | Status: DC
  Administered 2013-12-09 – 2013-12-10 (×2): 5 mg via ORAL
  Filled 2013-12-09 (×3): qty 1

## 2013-12-09 NOTE — CV Procedure (Signed)
Cardiac Catheterization Operative Report  KAVEH BLOWERS [id number] 8/19/201510:55 AM Drema Pry, DO  Procedure Performed:  1. Left Heart Catheterization 2. Selective Coronary Angiography 3. Right Heart Catheterization  Operator: Lauree Chandler, MD  Indication:  68 yo male with history of ischemic cardiomyopathy, CAD with recent echo demonstrating continued LV dysfunction. There are discrepancies over the years in his LVEF, some of which were very low in the past. Admitted in 2007 with acute MI. Medical management of CAD at that time due to non-viable tissue in the anterior wall and apex. Cardiac MRI in 2007 with full thickness scar in the anterior wall and apex.  He has no dyspnea or chest pain.                        Procedure Details: The risks, benefits, complications, treatment options, and expected outcomes were discussed with the patient. The patient and/or family concurred with the proposed plan, giving informed consent. The patient was brought to the cath lab after IV hydration was begun and oral premedication was given. The patient was further sedated with Versed and Fentanyl. The right wrist was prepped and draped in the usual manner. Revers Allens was positive. Using the modified Seldinger access technique, a 5 French sheath was placed in the right radial artery. The patient has an antecubital IV in the right arm that was prepped and draped. I then placed a 5 French sheath in the right antecubital vein.  A balloon tipped catheter was used to perform a right heart catheterization. Standard diagnostic catheters were used to perform selective coronary angiography. A pigtail catheter was used to measure LV pressures. There were no immediate complications. A Terumo hemostasis band was applied to the right wrist. The patient was taken to the recovery area in stable condition.   Hemodynamic Findings: Ao:  109/51             LV: 123/1/6 RA: 1               RV: 28/2/1 PA:  24/11 (mean 16)     PCWP: 3  Fick Cardiac Output: 6.4 L/min Fick Cardiac Index: 3.1 L/min/m2 Central Aortic Saturation: 93% Pulmonary Artery Saturation: 71%  Angiographic Findings:  Left main: No obstructive disease.   Left Anterior Descending Artery: Large caliber vessel that courses to the apex. The proximal vessel has diffuse 99% stenosis. There is a moderate caliber diagonal branch with proximal 70% stenosis followed by 99% stenosis involving both branches at the mid bifurcation point. The mid LAD has a focal 50-60% stenosis.   Circumflex Artery: Large caliber dominant vessel that gives off a small to moderate caliber intermediate branch, moderate caliber first OM branch, moderate caliber bifurcating second OM branch and a large caliber posterolateral branch. The intermediate branch has ostial 99% stenosis that extends throughout the proximal segment of the vessel. The moderate caliber first OM branch has ostial 90% stenosis. The bifurcating second OM branch has ostial 99% stenosis affecting both sub-branches. The distal AV groove Circumflex has a 80% stenosis at the bifurcation of the third OM branch. The left posterolateral branch has multiple sub-branches. One of these moderate caliber vessels has a 99% stenosis.   Right Coronary Artery: Small non-dominant vessel with 100% mid occlusion.   Left Ventricular Angiogram: Deferred.   Impression: 1. Severe triple vessel CAD 2. Ischemic cardiomyopathy, severe LV systolic dysfunction 3. Reported anterior wall scar by MRI in 2007  Recommendations: He has been  known to have severe CAD for the last 8 years. He has also had severe LV dysfunction for the last 8 years with some fluctuation in his LVEF over that time. He is asymptomatic. His CAD has progressed. The question here is if revascularization of his Circumflex system would give him any benefit/improvement in LV function. I will discuss with his primary cardiologist Dr. Percival Spanish. I would  continue medical management for now. Consider cardiac MRI to look for viability. If there is viable tissue, may need to consider CABG. Will admit to telemetry unit for hydration tonight with chronic kidney disease.        Complications:  None; patient tolerated the procedure well.
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
## 2013-12-09 NOTE — Interval H&P Note (Signed)
History and Physical Interval Note:  12/09/2013 9:57 AM  [name]  has presented today for cardiac cath with the diagnosis of cardiomyopathy. The various methods of treatment have been discussed with the patient and family. After consideration of risks, benefits and other options for treatment, the patient has consented to  Procedure(s): LEFT AND RIGHT HEART CATHETERIZATION WITH CORONARY ANGIOGRAM (N/A) as a surgical intervention .  The patient's history has been reviewed, patient examined, no change in status, stable for surgery.  I have reviewed the patient's chart and labs.  Questions were answered to the patient's satisfaction.     Cath Lab Visit (complete for each Cath Lab visit)  Clinical Evaluation Leading to the Procedure:   ACS: No.  Non-ACS:    Anginal Classification: No Symptoms  Anti-ischemic medical therapy: Minimal Therapy (1 class of medications)  Non-Invasive Test Results: No non-invasive testing performed  Prior CABG: No previous CABG         Suellen Durocher

## 2013-12-09 NOTE — Progress Notes (Signed)
TR BAND REMOVAL  LOCATION:    right radial  DEFLATED PER PROTOCOL:    Yes.    TIME BAND OFF / DRESSING APPLIED:    1345   SITE UPON ARRIVAL:    Level 0  SITE AFTER BAND REMOVAL:    Level 1 ( small bruise)  REVERSE ALLEN'S TEST:     positive  CIRCULATION SENSATION AND MOVEMENT:    Within Normal Limits   Yes.    COMMENTS:   Tolerated procedure well

## 2013-12-09 NOTE — Progress Notes (Signed)
Site area: rt brachial venous sheath Site Prior to Removal:  Level 0 Pressure Applied For: 10 minutes Manual:   yes Patient Status During Pull:  stable Post Pull Site:  Level 0 Post Pull Instructions Given:  yes Post Pull Pulses Present: yes-radial and ulnar Dressing Applied:  tegaderm Bedrest begins @ 1140 Comments: no complications

## 2013-12-10 ENCOUNTER — Encounter (HOSPITAL_COMMUNITY): Payer: Self-pay | Admitting: Cardiology

## 2013-12-10 DIAGNOSIS — N058 Unspecified nephritic syndrome with other morphologic changes: Secondary | ICD-10-CM

## 2013-12-10 DIAGNOSIS — N183 Chronic kidney disease, stage 3 unspecified: Secondary | ICD-10-CM

## 2013-12-10 DIAGNOSIS — I2589 Other forms of chronic ischemic heart disease: Secondary | ICD-10-CM

## 2013-12-10 DIAGNOSIS — E1129 Type 2 diabetes mellitus with other diabetic kidney complication: Secondary | ICD-10-CM

## 2013-12-10 DIAGNOSIS — I252 Old myocardial infarction: Secondary | ICD-10-CM

## 2013-12-10 DIAGNOSIS — I2510 Atherosclerotic heart disease of native coronary artery without angina pectoris: Secondary | ICD-10-CM

## 2013-12-10 DIAGNOSIS — I10 Essential (primary) hypertension: Secondary | ICD-10-CM

## 2013-12-10 DIAGNOSIS — I5042 Chronic combined systolic (congestive) and diastolic (congestive) heart failure: Secondary | ICD-10-CM

## 2013-12-10 HISTORY — DX: Atherosclerotic heart disease of native coronary artery without angina pectoris: I25.10

## 2013-12-10 LAB — BASIC METABOLIC PANEL
Anion gap: 10 (ref 5–15)
BUN: 25 mg/dL — AB (ref 6–23)
CALCIUM: 8.9 mg/dL (ref 8.4–10.5)
CO2: 28 meq/L (ref 19–32)
CREATININE: 1.54 mg/dL — AB (ref 0.50–1.35)
Chloride: 103 mEq/L (ref 96–112)
GFR calc Af Amer: 52 mL/min — ABNORMAL LOW (ref >90)
GFR, EST NON AFRICAN AMERICAN: 45 mL/min — AB (ref >90)
Glucose, Bld: 283 mg/dL — ABNORMAL HIGH (ref 70–99)
Potassium: 4.5 mEq/L (ref 3.7–5.3)
Sodium: 141 mEq/L (ref 137–147)

## 2013-12-10 LAB — CBC
HEMATOCRIT: 38.6 % — AB (ref 39.0–52.0)
Hemoglobin: 12.9 g/dL — ABNORMAL LOW (ref 13.0–17.0)
MCH: 28.8 pg (ref 26.0–34.0)
MCHC: 33.4 g/dL (ref 30.0–36.0)
MCV: 86.2 fL (ref 78.0–100.0)
PLATELETS: 142 10*3/uL — AB (ref 150–400)
RBC: 4.48 MIL/uL (ref 4.22–5.81)
RDW: 13.3 % (ref 11.5–15.5)
WBC: 6 10*3/uL (ref 4.0–10.5)

## 2013-12-10 LAB — GLUCOSE, CAPILLARY: Glucose-Capillary: 251 mg/dL — ABNORMAL HIGH (ref 70–99)

## 2013-12-10 MED ORDER — INSULIN ASPART 100 UNIT/ML ~~LOC~~ SOLN
0.0000 [IU] | Freq: Three times a day (TID) | SUBCUTANEOUS | Status: DC
  Administered 2013-12-10: 5 [IU] via SUBCUTANEOUS

## 2013-12-10 MED ORDER — INSULIN ASPART 100 UNIT/ML ~~LOC~~ SOLN: 0.0000 [IU] | Freq: Every day | SUBCUTANEOUS | Status: DC

## 2013-12-10 NOTE — Care Management Note (Addendum)
  Page 1 of 1   12/10/2013     11:21:07 AM CARE MANAGEMENT NOTE 12/10/2013  Patient:  [name] [name], [name] [name]   Account Number:  [id number]  Date Initiated:  12/10/2013  Documentation initiated by:  Latrisha Coiro  Subjective/Objective Assessment:   CP     Action/Plan:   CM to follow for disposition needs   Anticipated DC Date:  12/10/2013   Anticipated DC Plan:  HOME/SELF CARE         Choice offered to / List presented to:             Status of service:  Completed, signed off Medicare Important Message given?   (If response is "NO", the following Medicare IM given date fields will be blank) Date Medicare IM given:   Medicare IM given by:   Date Additional Medicare IM given:   Additional Medicare IM given by:    Discharge Disposition:  HOME/SELF CARE  Per UR Regulation:    If discussed at Long Length of Stay Meetings, dates discussed:    Comments:  Porfiria Heinrich RN, BSN, MSHL, CCM  Nurse - Case Manager, (Unit [phone]  12/10/2013 Cardiac Cath Med Review:  no needs identified Dispo:  Home / self care.

## 2013-12-10 NOTE — Progress Notes (Signed)
68 yo male with history of ischemic cardiomyopathy, CAD with recent echo demonstrating continued LV dysfunction. There are discrepancies over the years in his LVEF, some of which were very low in the past. Admitted in 2007 with acute MI. Medical management of CAD at that time due to non-viable tissue in the anterior wall and apex. Cardiac MRI in 2007 with full thickness scar in the anterior wall and apex. He has no dyspnea or chest pain. EF on recent echo 15%-20%.- also with MR murmur, presented for rt and Lt heart cath  Subjective: No complaints  Objective: Vital signs in last 24 hours: Temp:  [97.7 F (36.5 C)-98.3 F (36.8 C)] 97.7 F (36.5 C) (08/20 0557) Pulse Rate:  [42-61] 61 (08/20 0557) Resp:  [13-20] 20 (08/20 0557) BP: (122-188)/(58-105) 151/78 mmHg (08/20 0557) SpO2:  [98 %-100 %] 99 % (08/20 0557) Weight:  [175 lb 8 oz (79.606 kg)-176 lb 5.9 oz (80 kg)] 176 lb 5.9 oz (80 kg) (08/20 0016) Weight change:  Last BM Date: 12/09/13 Intake/Output from previous day: -237 08/19 0701 - 08/20 0700 In: 362.5 [I.V.:362.5] Out: 600 [Urine:600] Intake/Output this shift: Total I/O In: -  Out: 600 [Urine:600]  PE: General:Pleasant affect, NAD Skin:Warm and dry, brisk capillary refill HEENT:normocephalic, sclera clear, mucus membranes moist Neck:supple, no JVD, no bruits  Heart:S1S2 RRR = 2/6 SEM @ RUSB; No gallup, rub or click Lungs:clear without rales, rhonchi, or wheezes [id number], non tender, + BS, do not palpate liver spleen or masses Ext:no lower ext edema, 2+ pedal pulses, 2+ radial pulses; R wrist & brachia V site c/d/i. Neuro:alert and oriented, MAE, follows commands, + facial symmetry   Lab Results: No results found for this basename: WBC, HGB, HCT, PLT,  in the last 72 hours BMET  Recent Labs  12/09/13 0822  NA 146  K 4.0  CL 105  CO2 28  GLUCOSE 79  BUN 28*  CREATININE 1.75*  CALCIUM 9.4   No results found for this basename: TROPONINI, CK, MB,   in the last 72 hours  Lab Results  Component Value Date   CHOL 204* 12/03/2013   HDL 43 12/03/2013   LDLCALC 137* 12/03/2013   LDLDIRECT 100.5 02/08/2012   TRIG 122 12/03/2013   CHOLHDL 4.7 12/03/2013   Lab Results  Component Value Date   HGBA1C 8.7* 02/27/2013     Lab Results  Component Value Date   TSH 0.66 07/02/2013    Studies/Results: Cardiac cath 12/09/13:  Hemodynamic Findings:  Ao: 109/51  LV: 123/1/6  RA: 1  RV: 28/2/1  PA: 24/11 (mean 16)  PCWP: 3  Fick Cardiac Output: 6.4 L/min  Fick Cardiac Index: 3.1 L/min/m2  Central Aortic Saturation: 93%  Pulmonary Artery Saturation: 71%  Angiographic Findings:  Left main: No obstructive disease.  Left Anterior Descending Artery: Large caliber vessel that courses to the apex. The proximal vessel has diffuse 99% stenosis. There is a moderate caliber diagonal branch with proximal 70% stenosis followed by 99% stenosis involving both branches at the mid bifurcation point. The mid LAD has a focal 50-60% stenosis.  Circumflex Artery: Large caliber dominant vessel that gives off a small to moderate caliber intermediate branch, moderate caliber first OM branch, moderate caliber bifurcating second OM branch and a large caliber posterolateral branch. The intermediate branch has ostial 99% stenosis that extends throughout the proximal segment of the vessel. The moderate caliber first OM branch has ostial 90% stenosis. The bifurcating second OM branch has  ostial 99% stenosis affecting both sub-branches. The distal AV groove Circumflex has a 80% stenosis at the bifurcation of the third OM branch. The left posterolateral branch has multiple sub-branches. One of these moderate caliber vessels has a 99% stenosis.  Right Coronary Artery: Small non-dominant vessel with 100% mid occlusion.  Left Ventricular Angiogram: Deferred.  Impression:  1. Severe triple vessel CAD  2. Ischemic cardiomyopathy, severe LV systolic dysfunction  3. Reported anterior  wall scar by MRI in 2007  Medications: I have reviewed the patient's current medications. Scheduled Meds: . aspirin EC  81 mg Oral Daily  . atorvastatin  80 mg Oral QHS  . benazepril  5 mg Oral BID  . furosemide  60 mg Oral Daily  . insulin aspart  5 Units Subcutaneous TID WC  . nebivolol  10 mg Oral BID  . pantoprazole  40 mg Oral Daily  . spironolactone  25 mg Oral Daily   Continuous Infusions:  PRN Meds:.acetaminophen, ondansetron (ZOFRAN) IV  Tele - HR o/n drops to 40s with ICD-PM beats.  Assessment/Plan: Principal Problem:   Ischemic cardiomyopathy- EF 15-20% by echo this month though in May was 10-15%; had had worsening CHF and with med adjustment symptoms improved.  Plan had been for possible CRT upgrade to his ICD.    Active Problems:   CAD, multiple vessel, RCA 100% mid occl.; LAD 99% stenosisprox.  mid of 50-60%; LCX OM-1 90%, OM-2 99%,AV groove 80% - progression of his CAD.  Per Dr. Angelena Form "The question here is if revascularization of his Circumflex system would give him any benefit/improvement in LV function. I will discuss with his primary cardiologist Dr. Percival Spanish. I would continue medical management for now. Consider cardiac MRI to look for viability. If there is viable tissue, may need to consider CABG"    Chronic renal insufficiency, stage III (moderate)- fluids overnight will recheck BMP today - if stable, OK for d/c   Old Anterior MI (myocardial infarction)   Chronic combined systolic and diastolic heart failure   Implantable cardiac defibrillator- [employer]   Type 2 diabetes mellitus with diabetic nephropathy   MVP (mitral valve prolapse)    BP elevated   LOS: 1 day   Time spent with pt. :15 minutes. [facility]  Nurse Practitioner Certified Pager [phone] or after 5pm and on weekends call [phone] 12/10/2013, 6:24 AM  I have seen and evaluated the patient this AM along Pamelia Hoit, NP. I agree with her findings, examination as well as impression  recommendations.  Long-standing ICM with minimal Sx - progression of Cx disease, per discussion with Dr. Percival Spanish, plan for now is to continue Med Rx.  If Sx occur, would check for viabilty & consider Cx PCI.   Monitored o/n due to CKD-3.  BMP pending this AM.  If Cr stable, anticipate d/c home on Med Rx.  Need f/u with Dr. Percival Spanish.  PPM of ICD pacing for Overnight bradycardia - will hold off on increasing BB.  Will await lab results - d/c if renal Fxn stable.   Leonie Man, M.D., M.S. Interventional Cardiologist   Pager # [phone] 12/10/2013

## 2013-12-10 NOTE — Discharge Instructions (Signed)
Call [place] at [phone] if any bleeding, swelling or drainage at cath site.  May shower, no tub baths for 48 hours for groin sticks. No driving for 3 days, if you drive, no lifting over 5 pounds for 3 days.  Heart Healthy diabetic low salt diet.  Weigh daily Call [phone] if weight climbs more than 3 pounds in a day or 5 pounds in a week. No salt to very little salt in your diet.  No more than 2000 mg in a day. Call if increased shortness of breath or increased swelling.

## 2013-12-10 NOTE — Discharge Summary (Signed)
Physician Discharge Summary       Patient ID: [name] [name] MRN: [id number] DOB/AGE: 30-Jul-1945 68 y.o.  Admit date: 12/09/2013 Discharge date: 12/10/2013  Discharge Diagnoses:  Principal Problem:   Ischemic cardiomyopathy Active Problems:   CAD, multiple vessel, RCA 100% mid occl.; LAD 99% stenosisprox.  mid of 50-60%; LCX OM-1 90%, OM-2 99%,AV groove 80%    Chronic renal insufficiency, stage III (moderate)   Old Anterior MI (myocardial infarction)   Chronic combined systolic and diastolic heart failure   Implantable cardiac defibrillator- [employer]   Type 2 diabetes mellitus with diabetic nephropathy   MVP (mitral valve prolapse)   Discharged Condition: good  Procedures: rt and Lt cardiac cath 12/09/13 by Dr. Shon Millet Course:  68 yo male with history of ischemic cardiomyopathy, CAD with recent echo demonstrating continued LV dysfunction. There are discrepancies over the years in his LVEF, some of which were very low in the past. Admitted in 2007 with acute MI. Medical management of CAD at that time due to non-viable tissue in the anterior wall and apex. Cardiac MRI in 2007 with full thickness scar in the anterior wall and apex. He had SOB in May and was treated with medication and seen by Dr. Caryl Comes.  He has no further dyspnea or chest pain. EF on recent echo 15%-20%.- also with MR murmur, presented for rt and Lt heart cath which he underwent on the 19th. Increase of his CAD.  Pt did well overnight and by 12/10/13 he was stable for discharge, though we will have lab results back prior to discharge.    Per discussion with Dr. Percival Spanish, plan for now is to continue Med Rx. If Sx occur, would check for viability with MRI  & consider Cx PCI.  He will follow up in office.  I talked with pt's wife prior to discharge concerning activity level.  She is anxious to talk with Dr. Percival Spanish. We discussed pt will continue medical therapy for now.     Consults: None  Significant  Diagnostic Studies:  BMET BMET    Component Value Date/Time   NA 141 12/10/2013 0940   K 4.5 12/10/2013 0940   CL 103 12/10/2013 0940   CO2 28 12/10/2013 0940   GLUCOSE 283* 12/10/2013 0940   BUN 25* 12/10/2013 0940   CREATININE 1.54* 12/10/2013 0940   CREATININE 1.71* 12/03/2013 1149   CALCIUM 8.9 12/10/2013 0940   GFRNONAA 45* 12/10/2013 0940   GFRAA 52* 12/10/2013 0940      CBC CBC    Component Value Date/Time   WBC 6.0 12/10/2013 0940   RBC 4.48 12/10/2013 0940   HGB 12.9* 12/10/2013 0940   HCT 38.6* 12/10/2013 0940   PLT 142* 12/10/2013 0940   MCV 86.2 12/10/2013 0940   MCH 28.8 12/10/2013 0940   MCHC 33.4 12/10/2013 0940   RDW 13.3 12/10/2013 0940   LYMPHSABS 0.9 08/18/2013 1130   MONOABS 0.6 08/18/2013 1130   EOSABS 0.2 08/18/2013 1130   BASOSABS 0.0 08/18/2013 1130     Lipid Panel     Component Value Date/Time   CHOL 204* 12/03/2013 1135   TRIG 122 12/03/2013 1135   HDL 43 12/03/2013 1135   CHOLHDL 4.7 12/03/2013 1135   VLDL 24 12/03/2013 1135   LDLCALC 137* 12/03/2013 1135       Discharge Exam: Blood pressure 151/78, pulse 61, temperature 97.7 F (36.5 C), temperature source Oral, resp. rate 20, height 6\' 1"  (1.854 m), weight 176 lb 5.9 oz (  80 kg), SpO2 99.00%. GENERAL: Well appearing , NAD HEENT: [place]/AT, EOMI, MMM, anicteric sclera NECK: Mild JVD but no carotid bruit LUNGS: CTAP, nonlabored, normal air movement HEART: RRR, normal S1 and S2. No gallops or rubs. 3/6 HSM at L4 intercostal space. Nondisplaced PMI  ABD: Soft/NT/ND/NA BS. EXT: 2 plus pulses throughout, no edema, no cyanosis no clubbing  Disposition: 01-Home or Self Care     Medication List         aspirin EC 81 MG tablet  Take 81 mg by mouth daily.     atorvastatin 80 MG tablet  Commonly known as:  LIPITOR  Take 1 tablet (80 mg total) by mouth at bedtime.     benazepril 5 MG tablet  Commonly known as:  LOTENSIN  Take 1 tablet (5 mg total) by mouth 2 (two) times daily.     furosemide 40 MG tablet   Commonly known as:  LASIX  Take 60 mg by mouth daily.     insulin lispro 100 UNIT/ML injection  Commonly known as:  HUMALOG  Use 5-15 units 3 times daily before meals     nebivolol 10 MG tablet  Commonly known as:  BYSTOLIC  Take 1 tablet (10 mg total) by mouth 2 (two) times daily.     pantoprazole 40 MG tablet  Commonly known as:  PROTONIX  Take 40 mg by mouth daily.     spironolactone 25 MG tablet  Commonly known as:  ALDACTONE  Take 1 tablet (25 mg total) by mouth daily.         Discharge Instructions: Call [place] at [phone] if any bleeding, swelling or drainage at cath site.  May shower, no tub baths for 48 hours for groin sticks. No driving for 3 days, if you drive, no lifting over 5 pounds for 3 days.  Heart Healthy diabetic low salt diet.  Weigh daily Call [phone] if weight climbs more than 3 pounds in a day or 5 pounds in a week. No salt to very little salt in your diet.  No more than 2000 mg in a day. Call if increased shortness of breath or increased swelling.   Signed: Isaiah Serge Nurse Practitioner-Certified [facility]: [facility]ART[facility] 12/10/2013, 9:10 AM  Time spent on discharge : > 30 minutes.    I saw him and the patient this morning prior to discharge. The exam above was performed by me. He underwent diagnostic catheterization to reassess his coronary disease and was found to have progression of his artery known severe CAD. There was progression in the circumflex disease but the plan was for him to continue with medical therapy unless symptoms arise. He was monitored overnight due to his renal insufficiency. As his creatinine was stable, he was read for discharge this morning with no complications. He did have some occasional bradycardia requiring pacing by his ICD.  He was ready for discharge. Agree with discharge summary.   Leonie Man, M.D., M.S. Interventional Cardiologist   Pager #  [phone] 12/10/2013

## 2013-12-30 ENCOUNTER — Ambulatory Visit (INDEPENDENT_AMBULATORY_CARE_PROVIDER_SITE_OTHER): Payer: Commercial Managed Care - HMO | Admitting: Cardiology

## 2013-12-30 ENCOUNTER — Encounter: Payer: Self-pay | Admitting: Cardiology

## 2013-12-30 VITALS — BP 135/75 | HR 64 | Ht 73.0 in | Wt 182.2 lb

## 2013-12-30 DIAGNOSIS — R5381 Other malaise: Secondary | ICD-10-CM | POA: Diagnosis not present

## 2013-12-30 DIAGNOSIS — R0789 Other chest pain: Secondary | ICD-10-CM | POA: Diagnosis not present

## 2013-12-30 DIAGNOSIS — Z95810 Presence of automatic (implantable) cardiac defibrillator: Secondary | ICD-10-CM

## 2013-12-30 DIAGNOSIS — N058 Unspecified nephritic syndrome with other morphologic changes: Secondary | ICD-10-CM

## 2013-12-30 DIAGNOSIS — I2510 Atherosclerotic heart disease of native coronary artery without angina pectoris: Secondary | ICD-10-CM

## 2013-12-30 DIAGNOSIS — I5042 Chronic combined systolic (congestive) and diastolic (congestive) heart failure: Secondary | ICD-10-CM | POA: Diagnosis not present

## 2013-12-30 DIAGNOSIS — E1121 Type 2 diabetes mellitus with diabetic nephropathy: Secondary | ICD-10-CM

## 2013-12-30 DIAGNOSIS — G4733 Obstructive sleep apnea (adult) (pediatric): Secondary | ICD-10-CM

## 2013-12-30 DIAGNOSIS — I255 Ischemic cardiomyopathy: Secondary | ICD-10-CM

## 2013-12-30 DIAGNOSIS — I2589 Other forms of chronic ischemic heart disease: Secondary | ICD-10-CM

## 2013-12-30 DIAGNOSIS — R5383 Other fatigue: Secondary | ICD-10-CM

## 2013-12-30 DIAGNOSIS — I252 Old myocardial infarction: Secondary | ICD-10-CM

## 2013-12-30 DIAGNOSIS — N183 Chronic kidney disease, stage 3 unspecified: Secondary | ICD-10-CM

## 2013-12-30 DIAGNOSIS — E1129 Type 2 diabetes mellitus with other diabetic kidney complication: Secondary | ICD-10-CM

## 2013-12-30 DIAGNOSIS — I10 Essential (primary) hypertension: Secondary | ICD-10-CM

## 2013-12-30 MED ORDER — ISOSORBIDE MONONITRATE ER 30 MG PO TB24: 30.0000 mg | ORAL_TABLET | Freq: Every day | ORAL | Status: DC

## 2013-12-30 NOTE — Patient Instructions (Addendum)
Your physician has requested that you have a THALLIUM myoview. For further information please visit [email or web address]. Please follow instruction sheet, as given. THIS WILL BE SCHEDULED AT THE [address] OFFICE.  Your physician recommends that you schedule a follow-up appointment with Dr. Percival Spanish after the study. Your physician recommends that you return for lab work today. Your physician has recommended you make the following change in your medication: start new prescription for isosorbide. This has already been sent to the pharmacy.

## 2013-12-31 DIAGNOSIS — R5383 Other fatigue: Secondary | ICD-10-CM | POA: Insufficient documentation

## 2013-12-31 LAB — BASIC METABOLIC PANEL
BUN: 29 mg/dL — ABNORMAL HIGH (ref 6–23)
CO2: 29 mEq/L (ref 19–32)
Calcium: 9.4 mg/dL (ref 8.4–10.5)
Chloride: 106 mEq/L (ref 96–112)
Creat: 1.64 mg/dL — ABNORMAL HIGH (ref 0.50–1.35)
GLUCOSE: 221 mg/dL — AB (ref 70–99)
Potassium: 4.5 mEq/L (ref 3.5–5.3)
SODIUM: 143 meq/L (ref 135–145)

## 2013-12-31 LAB — TSH: TSH: 1.245 u[IU]/mL (ref 0.350–4.500)

## 2013-12-31 NOTE — Assessment & Plan Note (Signed)
Currently euvolemic. 

## 2013-12-31 NOTE — Assessment & Plan Note (Signed)
With ant scar

## 2013-12-31 NOTE — Assessment & Plan Note (Signed)
Will recheck BMP to eval post cath.

## 2013-12-31 NOTE — Assessment & Plan Note (Signed)
Followed by Dr. Klein 

## 2013-12-31 NOTE — Assessment & Plan Note (Signed)
EF 15%, with st jude ICD.  euvolemic today

## 2013-12-31 NOTE — Assessment & Plan Note (Signed)
Does not tolerate CPAP.
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
## 2013-12-31 NOTE — Assessment & Plan Note (Addendum)
Pt has little to no energy, possible anginal equivalent.  For now will add imdur 30 mg to see if it will help.  I discussed with Dr. Percival Spanish we will proceed with viability study  -he may need PCI to his LCX.   Pt unable to have MRI viability study due to his ICD-non MRI compatible.  Discussed with Nuc med in the office and we can do thallium viability study.  We will plan that and he will then follow up with Dr. Percival Spanish to discuss results and ultimate plan.  Pt cannot walk on a treadmill due to fatigue and SOB.

## 2013-12-31 NOTE — Assessment & Plan Note (Signed)
Followed by PCP

## 2013-12-31 NOTE — Assessment & Plan Note (Signed)
Will check viability study, see above note.

## 2013-12-31 NOTE — Assessment & Plan Note (Signed)
controlled 

## 2014-01-01 ENCOUNTER — Encounter: Payer: Self-pay | Admitting: *Deleted

## 2014-01-07 ENCOUNTER — Ambulatory Visit (HOSPITAL_COMMUNITY): Payer: Medicare HMO | Attending: Cardiology | Admitting: Radiology

## 2014-01-07 VITALS — Ht 73.0 in | Wt 182.0 lb

## 2014-01-07 DIAGNOSIS — R5381 Other malaise: Secondary | ICD-10-CM | POA: Insufficient documentation

## 2014-01-07 DIAGNOSIS — R9389 Abnormal findings on diagnostic imaging of other specified body structures: Secondary | ICD-10-CM | POA: Diagnosis not present

## 2014-01-07 DIAGNOSIS — E119 Type 2 diabetes mellitus without complications: Secondary | ICD-10-CM | POA: Insufficient documentation

## 2014-01-07 DIAGNOSIS — I2510 Atherosclerotic heart disease of native coronary artery without angina pectoris: Secondary | ICD-10-CM

## 2014-01-07 DIAGNOSIS — R079 Chest pain, unspecified: Secondary | ICD-10-CM | POA: Insufficient documentation

## 2014-01-07 DIAGNOSIS — Z95810 Presence of automatic (implantable) cardiac defibrillator: Secondary | ICD-10-CM | POA: Insufficient documentation

## 2014-01-07 DIAGNOSIS — R5383 Other fatigue: Secondary | ICD-10-CM | POA: Diagnosis not present

## 2014-01-07 DIAGNOSIS — R0609 Other forms of dyspnea: Secondary | ICD-10-CM | POA: Insufficient documentation

## 2014-01-07 DIAGNOSIS — I10 Essential (primary) hypertension: Secondary | ICD-10-CM | POA: Diagnosis not present

## 2014-01-07 DIAGNOSIS — R0789 Other chest pain: Secondary | ICD-10-CM

## 2014-01-07 DIAGNOSIS — R0989 Other specified symptoms and signs involving the circulatory and respiratory systems: Secondary | ICD-10-CM | POA: Insufficient documentation

## 2014-01-07 MED ORDER — THALLOUS CHLORIDE TL 201 1 MCI/ML IV SOLN
4.0000 | Freq: Once | INTRAVENOUS | Status: AC | PRN
  Administered 2014-01-07: 4 via INTRAVENOUS

## 2014-01-07 NOTE — Progress Notes (Signed)
[facility] NUCLEAR MED [address] [place], [place] [address] [phone]    Cardiology Nuclear Med Study  [name] [name] is a 68 y.o. male     MRN : [id number]     DOB: Sep 17, 1945  Procedure Date: 01/07/2014  Nuclear Med Background Indication for Stress Test:  Evaluation of Viability for potential PCI of LCx History:  CAD; 2007 MI; 2007 MRI-nonviable tissue; 2013 MPI-lg anteroapical scar, EF 36%; 2015 Cath-progressive severe multi vessel dz; AICD; ICM Cardiac Risk Factors: Hypertension and IDDM Type 2  Symptoms:  Chest Pain, DOE and Fatigue   Nuclear Pre-Procedure Caffeine/Decaff Intake:  N/A NPO After: N/A   Lungs:  clear O2 Sat: 98% on room air. IV 0.9% NS with Angio Cath:  22g  IV Site: R Hand  IV Started by:  Annye Rusk, CNMT  Chest Size (in):  44 Cup Size: n/a  Height: 6\' 1"  (1.854 m)  Weight:  182 lb (82.555 kg)  BMI:  Body mass index is 24.02 kg/(m^2). Tech Comments:  N/A    Nuclear Med Study 1 or 2 day study: 1 day  Stress Test Type:  N/A  Reading MD: Liane Comber, MD  Order Authorizing Provider:  J. Percival Spanish, MD; Cecilie Kicks, NP  Resting Radionuclide: Thallous Chloride Tl201  Resting Radionuclide Dose: 4.1 mCi   Stress Radionuclide:  N/A  Stress Radionuclide Dose: N/A          Stress Protocol Rest HR: N/A Stress HR: N/A  Rest BP: N/A Stress BP: N/A  Exercise Time (min): n/a METS: n/a           Dose of Adenosine (mg):  n/a Dose of Lexiscan: n/a mg  Dose of Atropine (mg): n/a Dose of Dobutamine: n/a mcg/kg/min (at max HR)  Stress Test Technologist: N/A  Nuclear Technologist:  Annye Rusk, CNMT     Rest Procedure:  Myocardial perfusion imaging was performed at rest at 15 minutes and 4 hours post resting injection by intravenous administration of Thallous Chloride TI201. Rest ECG: N/A  Stress Procedure:  n/a Stress ECG: n/a  QPS Raw Data Images:  Normal; no motion artifact; normal heart/lung ratio. Stress Images:  N/A Rest  Images:  There is a large perfusion defect in the mid and distal anterior wall and in the true apex, as well as in the entire inferior and basal and mid inferolateral walls.  Subtraction (SDS):  N/A. Transient Ischemic Dilatation (Normal <1.22):  n/a Lung/Heart Ratio (Normal <0.45):  0.44  Quantitative Gated Spect Images QGS EDV:  n/a QGS ESV:  n/a  Impression Exercise Capacity:  n/a BP Response:  n/a Clinical Symptoms:  n/a ECG Impression:  n/a Comparison with Prior Nuclear Study: No images to compare  Overall Impression:  Thiallium viability scan with immediate postinjection images and 4 hour delayed images without re-injection show a large scars in the mid LAD and LCX territory without any viability.   LV Ejection Fraction: n/a.  LV Wall Motion:  n/a

## 2014-01-12 ENCOUNTER — Encounter: Payer: Self-pay | Admitting: Cardiology

## 2014-01-12 ENCOUNTER — Ambulatory Visit (INDEPENDENT_AMBULATORY_CARE_PROVIDER_SITE_OTHER): Payer: Commercial Managed Care - HMO | Admitting: Cardiology

## 2014-01-12 VITALS — BP 128/82 | HR 52 | Ht 73.0 in | Wt 187.0 lb

## 2014-01-12 DIAGNOSIS — I5042 Chronic combined systolic (congestive) and diastolic (congestive) heart failure: Secondary | ICD-10-CM

## 2014-01-12 DIAGNOSIS — E782 Mixed hyperlipidemia: Secondary | ICD-10-CM

## 2014-01-12 DIAGNOSIS — I10 Essential (primary) hypertension: Secondary | ICD-10-CM

## 2014-01-12 DIAGNOSIS — Z79899 Other long term (current) drug therapy: Secondary | ICD-10-CM

## 2014-01-12 MED ORDER — ROSUVASTATIN CALCIUM 40 MG PO TABS: 40.0000 mg | ORAL_TABLET | Freq: Every day | ORAL | Status: DC

## 2014-01-12 MED ORDER — LISINOPRIL 5 MG PO TABS: 5.0000 mg | ORAL_TABLET | Freq: Every day | ORAL | Status: DC

## 2014-01-12 MED ORDER — LISINOPRIL 2.5 MG PO TABS: 2.5000 mg | ORAL_TABLET | Freq: Every day | ORAL | Status: DC
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
## 2014-01-12 NOTE — Patient Instructions (Signed)
Your physician recommends that you schedule a follow-up appointment in: 1 month with Cecilie Kicks  Stop taking lipitor  Start taking crestor 40 mg  Have Lipid and liver profile done in 8 Weeks   Have a BNP done in 10 days  Start taking Lisinopril 7.5 mg as directed

## 2014-01-22 LAB — BASIC METABOLIC PANEL
BUN: 37 mg/dL — ABNORMAL HIGH (ref 6–23)
CALCIUM: 9 mg/dL (ref 8.4–10.5)
CHLORIDE: 106 meq/L (ref 96–112)
CO2: 27 meq/L (ref 19–32)
CREATININE: 1.81 mg/dL — AB (ref 0.50–1.35)
Glucose, Bld: 185 mg/dL — ABNORMAL HIGH (ref 70–99)
Potassium: 4.6 mEq/L (ref 3.5–5.3)
Sodium: 142 mEq/L (ref 135–145)

## 2014-01-25 ENCOUNTER — Other Ambulatory Visit: Payer: Self-pay | Admitting: *Deleted

## 2014-01-25 DIAGNOSIS — E878 Other disorders of electrolyte and fluid balance, not elsewhere classified: Secondary | ICD-10-CM

## 2014-01-25 DIAGNOSIS — N183 Chronic kidney disease, stage 3 unspecified: Secondary | ICD-10-CM

## 2014-02-03 ENCOUNTER — Ambulatory Visit (INDEPENDENT_AMBULATORY_CARE_PROVIDER_SITE_OTHER): Payer: Commercial Managed Care - HMO | Admitting: *Deleted

## 2014-02-03 ENCOUNTER — Encounter: Payer: Self-pay | Admitting: Internal Medicine

## 2014-02-03 DIAGNOSIS — I255 Ischemic cardiomyopathy: Secondary | ICD-10-CM

## 2014-02-03 DIAGNOSIS — I5042 Chronic combined systolic (congestive) and diastolic (congestive) heart failure: Secondary | ICD-10-CM

## 2014-02-03 LAB — MDC_IDC_ENUM_SESS_TYPE_REMOTE
HIGH POWER IMPEDANCE MEASURED VALUE: 43 Ohm
Lead Channel Impedance Value: 440 Ohm
Lead Channel Sensing Intrinsic Amplitude: 12 mV
MDC IDC PG SERIAL: 1109991
MDC IDC STAT BRADY RV PERCENT PACED: 1 %

## 2014-02-03 NOTE — Progress Notes (Signed)
Remote ICD transmission.   

## 2014-02-08 ENCOUNTER — Ambulatory Visit (INDEPENDENT_AMBULATORY_CARE_PROVIDER_SITE_OTHER): Payer: Commercial Managed Care - HMO | Admitting: Cardiology

## 2014-02-08 ENCOUNTER — Encounter: Payer: Self-pay | Admitting: Cardiology

## 2014-02-08 VITALS — BP 111/63 | HR 61 | Ht 73.0 in | Wt 191.4 lb

## 2014-02-08 DIAGNOSIS — I255 Ischemic cardiomyopathy: Secondary | ICD-10-CM

## 2014-02-08 DIAGNOSIS — I2510 Atherosclerotic heart disease of native coronary artery without angina pectoris: Secondary | ICD-10-CM

## 2014-02-08 DIAGNOSIS — I5042 Chronic combined systolic (congestive) and diastolic (congestive) heart failure: Secondary | ICD-10-CM

## 2014-02-08 DIAGNOSIS — E782 Mixed hyperlipidemia: Secondary | ICD-10-CM

## 2014-02-08 NOTE — Assessment & Plan Note (Signed)
For lipid panel in Nov. On new crestor 40 mg daily.

## 2014-02-08 NOTE — Patient Instructions (Signed)
Your physician recommends that you schedule a follow-up appointment in: 6 Weeks with Mickel Baas

## 2014-02-08 NOTE — Assessment & Plan Note (Signed)
we will pursue medical management as there is no indication for high risk attempt at reperfusion. He will need aggressive risk reduction.

## 2014-02-08 NOTE — Assessment & Plan Note (Signed)
Currently on lisinopril 7.5 mg daily, he is to have BMP on Friday, if Cr. Stable or decreased will increase lisinopril to 10 mg daily and recheck BMP.  If further elevated will not add any more ACE.

## 2014-02-08 NOTE — Assessment & Plan Note (Signed)
euvolemic 
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
## 2014-02-13 LAB — BASIC METABOLIC PANEL
BUN: 27 mg/dL — ABNORMAL HIGH (ref 6–23)
CO2: 28 mEq/L (ref 19–32)
Calcium: 8.9 mg/dL (ref 8.4–10.5)
Chloride: 105 mEq/L (ref 96–112)
Creat: 1.68 mg/dL — ABNORMAL HIGH (ref 0.50–1.35)
Glucose, Bld: 225 mg/dL — ABNORMAL HIGH (ref 70–99)
Potassium: 4.6 mEq/L (ref 3.5–5.3)
Sodium: 140 mEq/L (ref 135–145)

## 2014-02-24 ENCOUNTER — Encounter: Payer: Self-pay | Admitting: Cardiology

## 2014-03-10 LAB — HEPATIC FUNCTION PANEL
ALBUMIN: 3.8 g/dL (ref 3.5–5.2)
ALT: 16 U/L (ref 0–53)
AST: 17 U/L (ref 0–37)
Alkaline Phosphatase: 101 U/L (ref 39–117)
BILIRUBIN DIRECT: 0.1 mg/dL (ref 0.0–0.3)
BILIRUBIN TOTAL: 0.5 mg/dL (ref 0.2–1.2)
Indirect Bilirubin: 0.4 mg/dL (ref 0.2–1.2)
Total Protein: 6.7 g/dL (ref 6.0–8.3)

## 2014-03-10 LAB — LIPID PANEL
CHOL/HDL RATIO: 3.1 ratio
Cholesterol: 120 mg/dL (ref 0–200)
HDL: 39 mg/dL — ABNORMAL LOW (ref >39)
LDL Cholesterol: 56 mg/dL (ref 0–99)
Triglycerides: 123 mg/dL (ref <150)
VLDL: 25 mg/dL (ref 0–40)

## 2014-03-17 ENCOUNTER — Other Ambulatory Visit: Payer: Self-pay | Admitting: *Deleted

## 2014-03-17 MED ORDER — BENAZEPRIL HCL 5 MG PO TABS
5.0000 mg | ORAL_TABLET | Freq: Two times a day (BID) | ORAL | Status: DC
Start: 2014-03-17 — End: 2015-04-14

## 2014-03-22 ENCOUNTER — Ambulatory Visit (INDEPENDENT_AMBULATORY_CARE_PROVIDER_SITE_OTHER): Payer: Commercial Managed Care - HMO | Admitting: Cardiology

## 2014-03-22 ENCOUNTER — Encounter: Payer: Self-pay | Admitting: Cardiology

## 2014-03-22 VITALS — BP 129/79 | HR 65 | Ht 73.0 in | Wt 192.8 lb

## 2014-03-22 DIAGNOSIS — N183 Chronic kidney disease, stage 3 unspecified: Secondary | ICD-10-CM

## 2014-03-22 DIAGNOSIS — I2510 Atherosclerotic heart disease of native coronary artery without angina pectoris: Secondary | ICD-10-CM

## 2014-03-22 DIAGNOSIS — N189 Chronic kidney disease, unspecified: Secondary | ICD-10-CM

## 2014-03-22 DIAGNOSIS — I255 Ischemic cardiomyopathy: Secondary | ICD-10-CM

## 2014-03-22 DIAGNOSIS — R0600 Dyspnea, unspecified: Secondary | ICD-10-CM

## 2014-03-22 DIAGNOSIS — E782 Mixed hyperlipidemia: Secondary | ICD-10-CM

## 2014-03-22 DIAGNOSIS — Z95810 Presence of automatic (implantable) cardiac defibrillator: Secondary | ICD-10-CM

## 2014-03-22 MED ORDER — LISINOPRIL 5 MG PO TABS: 5.0000 mg | ORAL_TABLET | Freq: Two times a day (BID) | ORAL | Status: DC

## 2014-03-22 NOTE — Assessment & Plan Note (Signed)
Cr had stabilized on last check, will increase lisinopril to 10 mg daily (  2 of the 5 mg tabs.)  Recheck Cr in 2 weeks follow up with Dr. Vita Barley in 6 weeks.

## 2014-03-22 NOTE — Assessment & Plan Note (Signed)
Recheck recently doing well continue Crestor at 40 mg.

## 2014-03-22 NOTE — Patient Instructions (Signed)
Lisinopril 5mg  TWICE DAILY  Your physician recommends that you schedule a follow-up appointment in 6 weeks with Dr.Hochrein

## 2014-03-22 NOTE — Assessment & Plan Note (Signed)
See above note
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
## 2014-03-22 NOTE — Assessment & Plan Note (Signed)
No shocks

## 2014-03-22 NOTE — Assessment & Plan Note (Signed)
Stable, medical management

## 2014-04-01 ENCOUNTER — Encounter (HOSPITAL_COMMUNITY): Payer: Self-pay | Admitting: Internal Medicine

## 2014-04-05 LAB — BASIC METABOLIC PANEL
BUN: 23 mg/dL (ref 6–23)
CHLORIDE: 106 meq/L (ref 96–112)
CO2: 28 meq/L (ref 19–32)
Calcium: 9.3 mg/dL (ref 8.4–10.5)
Creat: 1.65 mg/dL — ABNORMAL HIGH (ref 0.50–1.35)
Glucose, Bld: 303 mg/dL — ABNORMAL HIGH (ref 70–99)
POTASSIUM: 4.5 meq/L (ref 3.5–5.3)
Sodium: 138 mEq/L (ref 135–145)

## 2014-05-10 ENCOUNTER — Ambulatory Visit (INDEPENDENT_AMBULATORY_CARE_PROVIDER_SITE_OTHER): Payer: Commercial Managed Care - HMO | Admitting: *Deleted

## 2014-05-10 DIAGNOSIS — I255 Ischemic cardiomyopathy: Secondary | ICD-10-CM | POA: Diagnosis not present

## 2014-05-10 DIAGNOSIS — I5042 Chronic combined systolic (congestive) and diastolic (congestive) heart failure: Secondary | ICD-10-CM

## 2014-05-10 LAB — MDC_IDC_ENUM_SESS_TYPE_REMOTE
Battery Remaining Longevity: 88 mo
Date Time Interrogation Session: 20160118070017
HIGH POWER IMPEDANCE MEASURED VALUE: 49 Ohm
HighPow Impedance: 49 Ohm
Implantable Pulse Generator Serial Number: 1109991
Lead Channel Impedance Value: 550 Ohm
Lead Channel Pacing Threshold Amplitude: 0.75 V
Lead Channel Pacing Threshold Pulse Width: 0.5 ms
MDC IDC MSMT BATTERY REMAINING PERCENTAGE: 84 %
MDC IDC MSMT BATTERY VOLTAGE: 3.01 V
MDC IDC MSMT LEADCHNL RV SENSING INTR AMPL: 12 mV
MDC IDC SET LEADCHNL RV PACING AMPLITUDE: 2.5 V
MDC IDC SET LEADCHNL RV PACING PULSEWIDTH: 0.5 ms
MDC IDC SET LEADCHNL RV SENSING SENSITIVITY: 0.5 mV
MDC IDC SET ZONE DETECTION INTERVAL: 250 ms
MDC IDC STAT BRADY RV PERCENT PACED: 1 %
Zone Setting Detection Interval: 300 ms

## 2014-05-10 NOTE — Progress Notes (Signed)
Remote ICD transmission.   

## 2014-05-17 ENCOUNTER — Telehealth: Payer: Self-pay | Admitting: Cardiology

## 2014-05-17 ENCOUNTER — Ambulatory Visit: Payer: Commercial Managed Care - HMO | Admitting: Cardiology

## 2014-05-18 ENCOUNTER — Ambulatory Visit (INDEPENDENT_AMBULATORY_CARE_PROVIDER_SITE_OTHER): Payer: Commercial Managed Care - HMO | Admitting: Cardiology

## 2014-05-18 ENCOUNTER — Encounter: Payer: Self-pay | Admitting: Cardiology

## 2014-05-18 VITALS — BP 100/60 | HR 60 | Ht 74.0 in | Wt 190.0 lb

## 2014-05-18 DIAGNOSIS — I2510 Atherosclerotic heart disease of native coronary artery without angina pectoris: Secondary | ICD-10-CM | POA: Diagnosis not present
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
## 2014-05-18 NOTE — Patient Instructions (Signed)
Your physician recommends that you schedule a follow-up appointment in: one year with Dr. Hochrein  

## 2014-05-19 NOTE — Telephone Encounter (Signed)
Closed encounter °

## 2014-06-04 ENCOUNTER — Other Ambulatory Visit: Payer: Self-pay

## 2014-06-04 DIAGNOSIS — I5042 Chronic combined systolic (congestive) and diastolic (congestive) heart failure: Secondary | ICD-10-CM

## 2014-06-04 MED ORDER — SPIRONOLACTONE 25 MG PO TABS: 25.0000 mg | ORAL_TABLET | Freq: Every day | ORAL | Status: DC

## 2014-06-25 ENCOUNTER — Encounter: Payer: Self-pay | Admitting: Gastroenterology

## 2014-07-08 ENCOUNTER — Encounter: Payer: Self-pay | Admitting: Cardiology

## 2014-07-21 ENCOUNTER — Encounter: Payer: Self-pay | Admitting: Internal Medicine

## 2014-08-09 ENCOUNTER — Ambulatory Visit (INDEPENDENT_AMBULATORY_CARE_PROVIDER_SITE_OTHER): Payer: Commercial Managed Care - HMO | Admitting: *Deleted

## 2014-08-09 ENCOUNTER — Encounter: Payer: Self-pay | Admitting: Internal Medicine

## 2014-08-09 ENCOUNTER — Telehealth: Payer: Self-pay | Admitting: Cardiology

## 2014-08-09 DIAGNOSIS — I5042 Chronic combined systolic (congestive) and diastolic (congestive) heart failure: Secondary | ICD-10-CM | POA: Diagnosis not present

## 2014-08-09 DIAGNOSIS — I255 Ischemic cardiomyopathy: Secondary | ICD-10-CM | POA: Diagnosis not present

## 2014-08-09 NOTE — Telephone Encounter (Signed)
Spoke with pt and reminded pt of remote transmission that is due today. Pt verbalized understanding.   

## 2014-08-10 NOTE — Progress Notes (Signed)
Remote ICD transmission.   

## 2014-08-27 LAB — CUP PACEART REMOTE DEVICE CHECK
Battery Remaining Longevity: 86 mo
Battery Remaining Percentage: 83 %
Date Time Interrogation Session: 20160418060019
HIGH POWER IMPEDANCE MEASURED VALUE: 44 Ohm
HIGH POWER IMPEDANCE MEASURED VALUE: 45 Ohm
Lead Channel Impedance Value: 510 Ohm
Lead Channel Pacing Threshold Amplitude: 0.75 V
Lead Channel Pacing Threshold Pulse Width: 0.5 ms
Lead Channel Setting Pacing Pulse Width: 0.5 ms
Lead Channel Setting Sensing Sensitivity: 0.5 mV
MDC IDC MSMT BATTERY VOLTAGE: 2.99 V
MDC IDC MSMT LEADCHNL RV SENSING INTR AMPL: 12 mV
MDC IDC SET LEADCHNL RV PACING AMPLITUDE: 2.5 V
MDC IDC STAT BRADY RV PERCENT PACED: 1 %
Pulse Gen Serial Number: 1109991
Zone Setting Detection Interval: 250 ms
Zone Setting Detection Interval: 300 ms

## 2014-09-13 ENCOUNTER — Encounter: Payer: Self-pay | Admitting: Cardiology

## 2014-11-16 ENCOUNTER — Encounter: Payer: Self-pay | Admitting: *Deleted

## 2014-12-17 ENCOUNTER — Encounter: Payer: Self-pay | Admitting: *Deleted

## 2015-01-19 ENCOUNTER — Encounter: Payer: Self-pay | Admitting: *Deleted

## 2015-02-22 ENCOUNTER — Encounter: Payer: Self-pay | Admitting: *Deleted

## 2015-03-21 ENCOUNTER — Encounter: Payer: Self-pay | Admitting: *Deleted

## 2015-04-10 ENCOUNTER — Encounter (HOSPITAL_COMMUNITY): Payer: Self-pay | Admitting: *Deleted

## 2015-04-10 ENCOUNTER — Emergency Department (HOSPITAL_COMMUNITY): Payer: Commercial Managed Care - HMO

## 2015-04-10 ENCOUNTER — Observation Stay (HOSPITAL_COMMUNITY)
Admission: EM | Admit: 2015-04-10 | Discharge: 2015-04-14 | Disposition: A | Discharge status: Home / Self Care | Payer: Commercial Managed Care - HMO | Attending: Internal Medicine | Admitting: Internal Medicine

## 2015-04-10 DIAGNOSIS — I130 Hypertensive heart and chronic kidney disease with heart failure and stage 1 through stage 4 chronic kidney disease, or unspecified chronic kidney disease: Secondary | ICD-10-CM | POA: Diagnosis present

## 2015-04-10 DIAGNOSIS — N390 Urinary tract infection, site not specified: Secondary | ICD-10-CM | POA: Diagnosis not present

## 2015-04-10 DIAGNOSIS — I5042 Chronic combined systolic (congestive) and diastolic (congestive) heart failure: Secondary | ICD-10-CM | POA: Diagnosis not present

## 2015-04-10 DIAGNOSIS — I129 Hypertensive chronic kidney disease with stage 1 through stage 4 chronic kidney disease, or unspecified chronic kidney disease: Secondary | ICD-10-CM | POA: Insufficient documentation

## 2015-04-10 DIAGNOSIS — I472 Ventricular tachycardia: Secondary | ICD-10-CM | POA: Diagnosis not present

## 2015-04-10 DIAGNOSIS — I2510 Atherosclerotic heart disease of native coronary artery without angina pectoris: Secondary | ICD-10-CM | POA: Insufficient documentation

## 2015-04-10 DIAGNOSIS — Y9289 Other specified places as the place of occurrence of the external cause: Secondary | ICD-10-CM | POA: Diagnosis not present

## 2015-04-10 DIAGNOSIS — Z95810 Presence of automatic (implantable) cardiac defibrillator: Secondary | ICD-10-CM | POA: Diagnosis not present

## 2015-04-10 DIAGNOSIS — I639 Cerebral infarction, unspecified: Secondary | ICD-10-CM | POA: Diagnosis not present

## 2015-04-10 DIAGNOSIS — I252 Old myocardial infarction: Secondary | ICD-10-CM

## 2015-04-10 DIAGNOSIS — N183 Chronic kidney disease, stage 3 unspecified: Secondary | ICD-10-CM

## 2015-04-10 DIAGNOSIS — S0101XA Laceration without foreign body of scalp, initial encounter: Secondary | ICD-10-CM | POA: Diagnosis not present

## 2015-04-10 DIAGNOSIS — Z79899 Other long term (current) drug therapy: Secondary | ICD-10-CM | POA: Diagnosis not present

## 2015-04-10 DIAGNOSIS — R739 Hyperglycemia, unspecified: Secondary | ICD-10-CM

## 2015-04-10 DIAGNOSIS — I10 Essential (primary) hypertension: Secondary | ICD-10-CM

## 2015-04-10 DIAGNOSIS — Z8673 Personal history of transient ischemic attack (TIA), and cerebral infarction without residual deficits: Secondary | ICD-10-CM | POA: Insufficient documentation

## 2015-04-10 DIAGNOSIS — R471 Dysarthria and anarthria: Secondary | ICD-10-CM

## 2015-04-10 DIAGNOSIS — R809 Proteinuria, unspecified: Secondary | ICD-10-CM

## 2015-04-10 DIAGNOSIS — W1839XA Other fall on same level, initial encounter: Secondary | ICD-10-CM | POA: Diagnosis not present

## 2015-04-10 DIAGNOSIS — R2240 Localized swelling, mass and lump, unspecified lower limb: Secondary | ICD-10-CM | POA: Insufficient documentation

## 2015-04-10 DIAGNOSIS — R55 Syncope and collapse: Secondary | ICD-10-CM | POA: Diagnosis not present

## 2015-04-10 DIAGNOSIS — G629 Polyneuropathy, unspecified: Secondary | ICD-10-CM | POA: Insufficient documentation

## 2015-04-10 DIAGNOSIS — Z7982 Long term (current) use of aspirin: Secondary | ICD-10-CM | POA: Diagnosis not present

## 2015-04-10 DIAGNOSIS — N189 Chronic kidney disease, unspecified: Secondary | ICD-10-CM | POA: Diagnosis not present

## 2015-04-10 DIAGNOSIS — R011 Cardiac murmur, unspecified: Secondary | ICD-10-CM | POA: Diagnosis not present

## 2015-04-10 DIAGNOSIS — I5023 Acute on chronic systolic (congestive) heart failure: Secondary | ICD-10-CM | POA: Diagnosis present

## 2015-04-10 DIAGNOSIS — E1342 Other specified diabetes mellitus with diabetic polyneuropathy: Secondary | ICD-10-CM

## 2015-04-10 DIAGNOSIS — I255 Ischemic cardiomyopathy: Secondary | ICD-10-CM | POA: Diagnosis not present

## 2015-04-10 DIAGNOSIS — G4730 Sleep apnea, unspecified: Secondary | ICD-10-CM | POA: Insufficient documentation

## 2015-04-10 DIAGNOSIS — S0910XA Unspecified injury of muscle and tendon of head, initial encounter: Secondary | ICD-10-CM | POA: Diagnosis not present

## 2015-04-10 DIAGNOSIS — W19XXXA Unspecified fall, initial encounter: Secondary | ICD-10-CM

## 2015-04-10 DIAGNOSIS — Z794 Long term (current) use of insulin: Secondary | ICD-10-CM | POA: Diagnosis not present

## 2015-04-10 DIAGNOSIS — Y998 Other external cause status: Secondary | ICD-10-CM | POA: Diagnosis not present

## 2015-04-10 DIAGNOSIS — M109 Gout, unspecified: Secondary | ICD-10-CM | POA: Diagnosis not present

## 2015-04-10 DIAGNOSIS — G4733 Obstructive sleep apnea (adult) (pediatric): Secondary | ICD-10-CM | POA: Diagnosis present

## 2015-04-10 DIAGNOSIS — Y9389 Activity, other specified: Secondary | ICD-10-CM | POA: Insufficient documentation

## 2015-04-10 DIAGNOSIS — R5383 Other fatigue: Secondary | ICD-10-CM

## 2015-04-10 DIAGNOSIS — E1121 Type 2 diabetes mellitus with diabetic nephropathy: Secondary | ICD-10-CM | POA: Diagnosis present

## 2015-04-10 DIAGNOSIS — E1165 Type 2 diabetes mellitus with hyperglycemia: Secondary | ICD-10-CM | POA: Diagnosis not present

## 2015-04-10 DIAGNOSIS — E119 Type 2 diabetes mellitus without complications: Secondary | ICD-10-CM | POA: Insufficient documentation

## 2015-04-10 DIAGNOSIS — S0990XA Unspecified injury of head, initial encounter: Secondary | ICD-10-CM | POA: Diagnosis present

## 2015-04-10 DIAGNOSIS — E782 Mixed hyperlipidemia: Secondary | ICD-10-CM | POA: Diagnosis present

## 2015-04-10 DIAGNOSIS — I213 ST elevation (STEMI) myocardial infarction of unspecified site: Secondary | ICD-10-CM | POA: Diagnosis not present

## 2015-04-10 DIAGNOSIS — N139 Obstructive and reflux uropathy, unspecified: Secondary | ICD-10-CM

## 2015-04-10 DIAGNOSIS — I341 Nonrheumatic mitral (valve) prolapse: Secondary | ICD-10-CM

## 2015-04-10 LAB — I-STAT TROPONIN, ED: TROPONIN I, POC: 0.08 ng/mL (ref 0.00–0.08)

## 2015-04-10 LAB — GLUCOSE, CAPILLARY
GLUCOSE-CAPILLARY: 316 mg/dL — AB (ref 65–99)
GLUCOSE-CAPILLARY: 343 mg/dL — AB (ref 65–99)

## 2015-04-10 LAB — BRAIN NATRIURETIC PEPTIDE: B Natriuretic Peptide: 2401.8 pg/mL — ABNORMAL HIGH (ref 0.0–100.0)

## 2015-04-10 LAB — URINALYSIS, ROUTINE W REFLEX MICROSCOPIC
Ketones, ur: NEGATIVE mg/dL
Nitrite: NEGATIVE
Protein, ur: 100 mg/dL — AB
Specific Gravity, Urine: 1.036 — ABNORMAL HIGH (ref 1.005–1.030)
pH: 5 (ref 5.0–8.0)

## 2015-04-10 LAB — TROPONIN I
TROPONIN I: 0.08 ng/mL — AB (ref <0.031)
Troponin I: 0.07 ng/mL — ABNORMAL HIGH (ref <0.031)

## 2015-04-10 LAB — MAGNESIUM: MAGNESIUM: 1.9 mg/dL (ref 1.7–2.4)

## 2015-04-10 LAB — CBC WITH DIFFERENTIAL/PLATELET
BASOS PCT: 0 %
Basophils Absolute: 0 10*3/uL (ref 0.0–0.1)
EOS ABS: 0.1 10*3/uL (ref 0.0–0.7)
Eosinophils Relative: 2 %
HCT: 41 % (ref 39.0–52.0)
HEMOGLOBIN: 12.6 g/dL — AB (ref 13.0–17.0)
Lymphocytes Relative: 9 %
Lymphs Abs: 0.4 10*3/uL — ABNORMAL LOW (ref 0.7–4.0)
MCH: 28 pg (ref 26.0–34.0)
MCHC: 30.7 g/dL (ref 30.0–36.0)
MCV: 91.1 fL (ref 78.0–100.0)
Monocytes Absolute: 0.5 10*3/uL (ref 0.1–1.0)
Monocytes Relative: 9 %
NEUTROS PCT: 80 %
Neutro Abs: 4 10*3/uL (ref 1.7–7.7)
Platelets: 161 10*3/uL (ref 150–400)
RBC: 4.5 MIL/uL (ref 4.22–5.81)
RDW: 14.7 % (ref 11.5–15.5)
WBC: 5 10*3/uL (ref 4.0–10.5)

## 2015-04-10 LAB — BASIC METABOLIC PANEL
Anion gap: 8 (ref 5–15)
BUN: 20 mg/dL (ref 6–20)
CALCIUM: 8.8 mg/dL — AB (ref 8.9–10.3)
CO2: 20 mmol/L — ABNORMAL LOW (ref 22–32)
CREATININE: 1.92 mg/dL — AB (ref 0.61–1.24)
Chloride: 111 mmol/L (ref 101–111)
GFR calc non Af Amer: 34 mL/min — ABNORMAL LOW (ref >60)
GFR, EST AFRICAN AMERICAN: 40 mL/min — AB (ref >60)
GLUCOSE: 405 mg/dL — AB (ref 65–99)
POTASSIUM: 4.6 mmol/L (ref 3.5–5.1)
SODIUM: 139 mmol/L (ref 135–145)

## 2015-04-10 LAB — URINE MICROSCOPIC-ADD ON

## 2015-04-10 MED ORDER — ASPIRIN EC 81 MG PO TBEC
81.0000 mg | DELAYED_RELEASE_TABLET | Freq: Every day | ORAL | Status: DC
  Administered 2015-04-11 – 2015-04-14 (×4): 81 mg via ORAL
  Filled 2015-04-10 (×4): qty 1

## 2015-04-10 MED ORDER — SPIRONOLACTONE 25 MG PO TABS
25.0000 mg | ORAL_TABLET | Freq: Every day | ORAL | Status: DC
  Administered 2015-04-11: 25 mg via ORAL
  Filled 2015-04-10 (×2): qty 1

## 2015-04-10 MED ORDER — INSULIN ASPART 100 UNIT/ML ~~LOC~~ SOLN
0.0000 [IU] | Freq: Three times a day (TID) | SUBCUTANEOUS | Status: DC
  Administered 2015-04-11: 5 [IU] via SUBCUTANEOUS
  Administered 2015-04-11: 3 [IU] via SUBCUTANEOUS
  Administered 2015-04-11: 8 [IU] via SUBCUTANEOUS
  Administered 2015-04-12: 2 [IU] via SUBCUTANEOUS
  Administered 2015-04-12 – 2015-04-13 (×4): 3 [IU] via SUBCUTANEOUS

## 2015-04-10 MED ORDER — FUROSEMIDE 40 MG PO TABS
60.0000 mg | ORAL_TABLET | Freq: Every day | ORAL | Status: DC
  Administered 2015-04-11: 60 mg via ORAL
  Filled 2015-04-10 (×2): qty 1

## 2015-04-10 MED ORDER — TETANUS-DIPHTH-ACELL PERTUSSIS 5-2.5-18.5 LF-MCG/0.5 IM SUSP: 0.5000 mL | Freq: Once | INTRAMUSCULAR | Status: DC

## 2015-04-10 MED ORDER — DEXTROSE 5 % IV SOLN
1.0000 g | Freq: Once | INTRAVENOUS | Status: AC
  Administered 2015-04-10: 1 g via INTRAVENOUS
  Filled 2015-04-10: qty 10

## 2015-04-10 MED ORDER — ROSUVASTATIN CALCIUM 20 MG PO TABS
40.0000 mg | ORAL_TABLET | Freq: Every day | ORAL | Status: DC
  Administered 2015-04-11 – 2015-04-14 (×4): 40 mg via ORAL
  Filled 2015-04-10 (×4): qty 2

## 2015-04-10 MED ORDER — LISINOPRIL 5 MG PO TABS
5.0000 mg | ORAL_TABLET | Freq: Two times a day (BID) | ORAL | Status: DC
  Administered 2015-04-10 – 2015-04-11 (×3): 5 mg via ORAL
  Filled 2015-04-10 (×4): qty 1

## 2015-04-10 MED ORDER — DEXTROSE 5 % IV SOLN
1.0000 g | INTRAVENOUS | Status: DC
  Administered 2015-04-11: 1 g via INTRAVENOUS
  Filled 2015-04-10 (×2): qty 10

## 2015-04-10 MED ORDER — INSULIN ASPART 100 UNIT/ML ~~LOC~~ SOLN
4.0000 [IU] | Freq: Once | SUBCUTANEOUS | Status: AC
  Administered 2015-04-10: 4 [IU] via SUBCUTANEOUS

## 2015-04-10 MED ORDER — SODIUM CHLORIDE 0.9 % IJ SOLN
3.0000 mL | Freq: Two times a day (BID) | INTRAMUSCULAR | Status: DC
  Administered 2015-04-10 – 2015-04-14 (×7): 3 mL via INTRAVENOUS

## 2015-04-10 MED ORDER — ONDANSETRON HCL 4 MG/2ML IJ SOLN: 4.0000 mg | Freq: Four times a day (QID) | INTRAMUSCULAR | Status: DC | PRN

## 2015-04-10 MED ORDER — SODIUM CHLORIDE 0.9 % IJ SOLN
3.0000 mL | Freq: Two times a day (BID) | INTRAMUSCULAR | Status: DC
  Administered 2015-04-11 – 2015-04-14 (×3): 3 mL via INTRAVENOUS

## 2015-04-10 MED ORDER — PANTOPRAZOLE SODIUM 40 MG PO TBEC
40.0000 mg | DELAYED_RELEASE_TABLET | Freq: Every day | ORAL | Status: DC
  Administered 2015-04-11 – 2015-04-14 (×4): 40 mg via ORAL
  Filled 2015-04-10 (×4): qty 1

## 2015-04-10 MED ORDER — ISOSORBIDE MONONITRATE ER 30 MG PO TB24
30.0000 mg | ORAL_TABLET | Freq: Every day | ORAL | Status: DC
  Administered 2015-04-11 – 2015-04-14 (×4): 30 mg via ORAL
  Filled 2015-04-10 (×5): qty 1

## 2015-04-10 MED ORDER — ACETAMINOPHEN 650 MG RE SUPP: 650.0000 mg | Freq: Four times a day (QID) | RECTAL | Status: DC | PRN

## 2015-04-10 MED ORDER — SODIUM CHLORIDE 0.9 % IV SOLN: 250.0000 mL | INTRAVENOUS | Status: DC | PRN

## 2015-04-10 MED ORDER — BENAZEPRIL HCL 5 MG PO TABS: 5.0000 mg | ORAL_TABLET | Freq: Two times a day (BID) | ORAL | Status: DC

## 2015-04-10 MED ORDER — SODIUM CHLORIDE 0.9 % IJ SOLN: 3.0000 mL | INTRAMUSCULAR | Status: DC | PRN

## 2015-04-10 MED ORDER — ONDANSETRON HCL 4 MG PO TABS: 4.0000 mg | ORAL_TABLET | Freq: Four times a day (QID) | ORAL | Status: DC | PRN

## 2015-04-10 MED ORDER — ACETAMINOPHEN 325 MG PO TABS: 650.0000 mg | ORAL_TABLET | Freq: Four times a day (QID) | ORAL | Status: DC | PRN

## 2015-04-10 MED ORDER — NEBIVOLOL HCL 10 MG PO TABS
10.0000 mg | ORAL_TABLET | Freq: Two times a day (BID) | ORAL | Status: DC
  Administered 2015-04-10 – 2015-04-14 (×8): 10 mg via ORAL
  Filled 2015-04-10 (×10): qty 1

## 2015-04-10 NOTE — ED Notes (Signed)
Attempted report 
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
## 2015-04-10 NOTE — ED Notes (Signed)
Pt in from [place] via [place], pt had witnessed fall from standing position, pt reports that he slipped, pt hit head, -LOC, pt A&O x3, disoriented to month & year, pt follows commands, speaks in complete sentences, pt does not take blood thinners, pt reports taking 81 mg ASA daily, pt moves all extremities, ambulatory on scene, pt denies neck & back pain, pt presents to ED with C collar

## 2015-04-11 ENCOUNTER — Observation Stay (HOSPITAL_BASED_OUTPATIENT_CLINIC_OR_DEPARTMENT_OTHER): Payer: Commercial Managed Care - HMO

## 2015-04-11 ENCOUNTER — Observation Stay (HOSPITAL_COMMUNITY): Payer: Commercial Managed Care - HMO

## 2015-04-11 DIAGNOSIS — Z95810 Presence of automatic (implantable) cardiac defibrillator: Secondary | ICD-10-CM

## 2015-04-11 DIAGNOSIS — R55 Syncope and collapse: Secondary | ICD-10-CM | POA: Diagnosis not present

## 2015-04-11 DIAGNOSIS — I10 Essential (primary) hypertension: Secondary | ICD-10-CM | POA: Diagnosis not present

## 2015-04-11 DIAGNOSIS — N390 Urinary tract infection, site not specified: Secondary | ICD-10-CM | POA: Diagnosis not present

## 2015-04-11 DIAGNOSIS — R011 Cardiac murmur, unspecified: Secondary | ICD-10-CM | POA: Diagnosis not present

## 2015-04-11 DIAGNOSIS — G4733 Obstructive sleep apnea (adult) (pediatric): Secondary | ICD-10-CM | POA: Diagnosis not present

## 2015-04-11 DIAGNOSIS — R2240 Localized swelling, mass and lump, unspecified lower limb: Secondary | ICD-10-CM | POA: Diagnosis not present

## 2015-04-11 DIAGNOSIS — E782 Mixed hyperlipidemia: Secondary | ICD-10-CM | POA: Diagnosis not present

## 2015-04-11 DIAGNOSIS — I255 Ischemic cardiomyopathy: Secondary | ICD-10-CM | POA: Diagnosis not present

## 2015-04-11 DIAGNOSIS — S0101XA Laceration without foreign body of scalp, initial encounter: Secondary | ICD-10-CM | POA: Diagnosis not present

## 2015-04-11 DIAGNOSIS — G629 Polyneuropathy, unspecified: Secondary | ICD-10-CM | POA: Diagnosis not present

## 2015-04-11 DIAGNOSIS — M109 Gout, unspecified: Secondary | ICD-10-CM | POA: Diagnosis not present

## 2015-04-11 DIAGNOSIS — I5042 Chronic combined systolic (congestive) and diastolic (congestive) heart failure: Secondary | ICD-10-CM | POA: Diagnosis not present

## 2015-04-11 LAB — GLUCOSE, CAPILLARY
GLUCOSE-CAPILLARY: 171 mg/dL — AB (ref 65–99)
Glucose-Capillary: 224 mg/dL — ABNORMAL HIGH (ref 65–99)
Glucose-Capillary: 256 mg/dL — ABNORMAL HIGH (ref 65–99)
Glucose-Capillary: 175 mg/dL — ABNORMAL HIGH (ref 65–99)

## 2015-04-11 LAB — BASIC METABOLIC PANEL
ANION GAP: 7 (ref 5–15)
BUN: 19 mg/dL (ref 6–20)
CALCIUM: 8.7 mg/dL — AB (ref 8.9–10.3)
CO2: 23 mmol/L (ref 22–32)
CREATININE: 1.9 mg/dL — AB (ref 0.61–1.24)
Chloride: 111 mmol/L (ref 101–111)
GFR, EST AFRICAN AMERICAN: 40 mL/min — AB (ref >60)
GFR, EST NON AFRICAN AMERICAN: 35 mL/min — AB (ref >60)
GLUCOSE: 139 mg/dL — AB (ref 65–99)
Potassium: 4.1 mmol/L (ref 3.5–5.1)
Sodium: 141 mmol/L (ref 135–145)

## 2015-04-11 LAB — TROPONIN I: TROPONIN I: 0.09 ng/mL — AB (ref <0.031)

## 2015-04-11 LAB — BRAIN NATRIURETIC PEPTIDE: B Natriuretic Peptide: 2568.2 pg/mL — ABNORMAL HIGH (ref 0.0–100.0)

## 2015-04-11 MED ORDER — INSULIN ASPART 100 UNIT/ML ~~LOC~~ SOLN
4.0000 [IU] | Freq: Three times a day (TID) | SUBCUTANEOUS | Status: DC
  Administered 2015-04-11 – 2015-04-13 (×7): 4 [IU] via SUBCUTANEOUS

## 2015-04-11 MED ORDER — INSULIN GLARGINE 100 UNIT/ML ~~LOC~~ SOLN
5.0000 [IU] | Freq: Every day | SUBCUTANEOUS | Status: DC
  Administered 2015-04-11 – 2015-04-13 (×3): 5 [IU] via SUBCUTANEOUS
  Filled 2015-04-11 (×4): qty 0.05

## 2015-04-11 NOTE — Progress Notes (Signed)
  Echocardiogram 2D Echocardiogram has been performed.  Donata Clay 04/11/2015, 4:55 PM

## 2015-04-11 NOTE — Progress Notes (Signed)
[facility]                                                                              Patient Demographics  [name] [name], is a 69 y.o. male, DOB - 1946-04-08, [id number]  Admit date - 04/10/2015   Admitting Physician Bobette Mo, MD  Outpatient Primary MD for the patient is Thomos Lemons, DO  LOS -    Chief Complaint  Patient presents with  . Fall       Brief HPI   [name] [name] is a 69 y.o. male with a past medical history of coronary atherosclerosis, CHF with an EF 15-20 % in 11/2013, CVA, hypertension, type 2 diabetes with peripheral neuropathy, CKD, hyperlipidemia who comes to the emergency department after having syncopal episode while Standing in line at [facility] and sustaining a head laceration in the back of his head. Until just a few days ago, the patient had increased lower extremity edema for about 2 weeks, he was on an increased dose of furosemide for a week, as suggested by his cardiologist The patient was admitted for further workup.   Assessment & Plan    Principal Problem:   Syncope in the setting of ischemic cardiomyopathy, CHF with EF of 15-20%, recently increased dose of furosemide, elevated BNP 2568, troponin 0.09 - Serial cardiac enzymes obtained, troponin trending up to 0.09, cardiology consult called, patient's primary cardiologist, Dr. Dr. Antoine Poche - Follow 2-D echo and carotid Dopplers, continue aspirin, Imdur, lisinopril, beta [name]  Active Problems:   HYPERLIPIDEMIA - Continue statin    Essential hypertension - Currently stable, continue Imdur, lisinopril, beta [name]    Type 2 diabetes mellitus with diabetic nephropathy (HCC) - Continue sliding scale insulin    UTI (lower urinary tract infection) - Follow urine culture and sensitivities, continue IV Rocephin  Deconditioning Follow PT evaluation  Mild acute on Chronic systolic CHF - EF 15-20%, on furosemide and spironolactone, cardiology consulted, will  follow recommendations   Code Status: Full code  Family Communication: Discussed in detail with the patient, all imaging results, lab results explained to the patient   Disposition Plan:   Time Spent in minutes   25 Minutes  Procedures  ECHO  Consults   CARDIOLOGY  DVT Prophylaxis   SCD's  Medications  Scheduled Meds: . aspirin EC  81 mg Oral Daily  . cefTRIAXone (ROCEPHIN)  IV  1 g Intravenous Q24H  . furosemide  60 mg Oral Daily  . insulin aspart  0-15 Units Subcutaneous TID WC  . isosorbide mononitrate  30 mg Oral Daily  . lisinopril  5 mg Oral BID  . nebivolol  10 mg Oral BID  . pantoprazole  40 mg Oral Daily  . rosuvastatin  40 mg Oral Daily  . sodium chloride  3 mL Intravenous Q12H  . sodium chloride  3 mL Intravenous Q12H  . spironolactone  25 mg Oral Daily   Continuous Infusions:  PRN Meds:.sodium chloride, acetaminophen **OR** acetaminophen, ondansetron **OR** ondansetron (ZOFRAN) IV, sodium chloride   Antibiotics   Anti-infectives    Start     Dose/Rate Route Frequency Ordered Stop   04/11/15  1800  cefTRIAXone (ROCEPHIN) 1 g in dextrose 5 % 50 mL IVPB     1 g 100 mL/hr over 30 Minutes Intravenous Every 24 hours 04/10/15 2004     04/10/15 1830  cefTRIAXone (ROCEPHIN) 1 g in dextrose 5 % 50 mL IVPB     1 g 100 mL/hr over 30 Minutes Intravenous  Once 04/10/15 1824 04/10/15 1938        Subjective:   [name] [name] was seen and examined today. Patient denies dizziness, chest pain, shortness of breath, abdominal pain, N/V/D/C, new weakness, numbess, tingling. No acute events overnight.    Objective:   Blood pressure 115/90, pulse 90, temperature 98 F (36.7 C), temperature source Oral, resp. rate 18, height 6\' 2"  (1.88 m), weight 89.359 kg (197 lb), SpO2 96 %.  Wt Readings from Last 3 Encounters:  04/10/15 89.359 kg (197 lb)  05/18/14 86.183 kg (190 lb)  03/22/14 87.454 kg (192 lb 12.8 oz)     Intake/Output Summary (Last 24 hours) at 04/11/15  1224 Last data filed at 04/11/15 0900  Gross per 24 hour  Intake    600 ml  Output    650 ml  Net    -50 ml    Exam  General: Alert and oriented x 3, NAD  HEENT:  PERRLA, EOMI, Anicteric Sclera, mucous membranes moist.   Neck: Supple, no JVD, no masses  CVS: S1 S2 auscultated, no rubs, murmurs or gallops. Regular rate and rhythm.  Respiratory: Clear to auscultation bilaterally, no wheezing, rales or rhonchi  Abdomen: Soft, nontender, nondistended, + bowel sounds  Ext: no cyanosis clubbing, 1-2+ edema  Neuro: AAOx3, Cr N's II- XII. Strength 5/5 upper and lower extremities bilaterally  Skin: No rashes  Psych: Normal affect and demeanor, alert and oriented x3    Data Review   Micro Results No results found for this or any previous visit (from the past 240 hour(s)).  Radiology Reports Dg Chest 2 View  04/10/2015  CLINICAL DATA:  Syncopal episode arrest from 1 hour ago. History of hypertension and diabetes. EXAM: CHEST  2 VIEW COMPARISON:  08/18/2013. FINDINGS: Mild enlargement of the cardiopericardial silhouette. No mediastinal or hilar masses or convincing adenopathy. Left anterior chest wall AICD is stable and well positioned. Clear lungs.  No pleural effusion or pneumothorax. Bony thorax is demineralized but grossly intact. IMPRESSION: No acute cardiopulmonary disease. Electronically Signed   By: Amie Portland M.D.   On: 04/10/2015 16:58   Ct Head Wo Contrast  04/10/2015  CLINICAL DATA:  Had syncopal episode at [employer], struck back of head when he hit the floor, laceration, history hypertension, coronary artery disease post MI, ischemic cardiomyopathy, type II diabetes mellitus, stroke EXAM: CT HEAD WITHOUT CONTRAST TECHNIQUE: Contiguous axial images were obtained from the base of the skull through the vertex without intravenous contrast. COMPARISON:  09/06/2011 FINDINGS: Generalized atrophy. Stable ventricular morphology. No midline shift or mass effect. Small vessel  chronic ischemic changes of deep cerebral white matter. No intracranial hemorrhage, mass lesion, or evidence acute infarction. No extra-axial fluid collections. Visualized paranasal sinuses and mastoid air cells clear. Calvaria intact. IMPRESSION: No acute intracranial abnormalities. Electronically Signed   By: Ulyses Southward M.D.   On: 04/10/2015 17:06    CBC  Recent Labs Lab 04/10/15 1613  WBC 5.0  HGB 12.6*  HCT 41.0  PLT 161  MCV 91.1  MCH 28.0  MCHC 30.7  RDW 14.7  LYMPHSABS 0.4*  MONOABS 0.5  EOSABS 0.1  BASOSABS 0.0  Chemistries   Recent Labs Lab 04/10/15 1613 04/10/15 2202 04/11/15 0400  NA 139  --  141  K 4.6  --  4.1  CL 111  --  111  CO2 20*  --  23  GLUCOSE 405*  --  139*  BUN 20  --  19  CREATININE 1.92*  --  1.90*  CALCIUM 8.8*  --  8.7*  MG  --  1.9  --    ------------------------------------------------------------------------------------------------------------------ estimated creatinine clearance is 43.3 mL/min (by C-G formula based on Cr of 1.9). ------------------------------------------------------------------------------------------------------------------ No results for input(s): HGBA1C in the last 72 hours. ------------------------------------------------------------------------------------------------------------------ No results for input(s): CHOL, HDL, LDLCALC, TRIG, CHOLHDL, LDLDIRECT in the last 72 hours. ------------------------------------------------------------------------------------------------------------------ No results for input(s): TSH, T4TOTAL, T3FREE, THYROIDAB in the last 72 hours.  Invalid input(s): FREET3 ------------------------------------------------------------------------------------------------------------------ No results for input(s): VITAMINB12, FOLATE, FERRITIN, TIBC, IRON, RETICCTPCT in the last 72 hours.  Coagulation profile No results for input(s): INR, PROTIME in the last 168 hours.  No results for  input(s): DDIMER in the last 72 hours.  Cardiac Enzymes  Recent Labs Lab 04/10/15 1613 04/10/15 2202 04/11/15 0400  TROPONINI 0.07* 0.08* 0.09*   ------------------------------------------------------------------------------------------------------------------ Invalid input(s): POCBNP   Recent Labs  04/10/15 1957 04/10/15 2245 04/11/15 0737 04/11/15 1139  GLUCAP 316* 343* 171* 224*     Genie Wenke M.D. [facility] 04/11/2015, 12:24 PM  Pager: [phone] Between 7am to 7pm - call Pager - [phone]  After 7pm go to [email or web address] - password [email or web address]  Call night coverage person covering after 7pm
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
## 2015-04-11 NOTE — Evaluation (Signed)
Physical Therapy Evaluation Patient Details Name: [name] [name] MRN: [id number] DOB: 11-27-1945 Today's Date: 04/11/2015   History of Present Illness  [name] [name] is a 69 y.o. male with a past medical history of coronary atherosclerosis, CHF with an EF 15-20 % in 11/2013, CVA, hypertension, type 2 diabetes with peripheral neuropathy, CKD, hyperlipidemia who comes to the emergency department after having a fall when standing in line at [facility] and sustaining a head laceration in the back of his head.   Clinical Impression  Pt functioning near baseline however does demo impaired balance as indicated by score of 15 on DGI. Recommend HHPT to address impaired strength and balance to minimize falls risk.    Follow Up Recommendations Home health PT;Supervision/Assistance - 24 hour    Equipment Recommendations  None recommended by PT (advised pt to use cane more often than just in crowds)    Recommendations for Other Services       Precautions / Restrictions Precautions Precautions: Fall Precaution Comments: EF 15-20% Restrictions Weight Bearing Restrictions: No      Mobility  Bed Mobility Overal bed mobility: Modified Independent             General bed mobility comments: hob elevated and used bed rail but no physically assist required  Transfers Overall transfer level: Needs assistance Equipment used: None Transfers: Sit to/from Stand Sit to Stand: Supervision         General transfer comment: pt with good technique, supervision for safety/precaution due to recent fall  Ambulation/Gait Ambulation/Gait assistance: Min guard Ambulation Distance (Feet): 150 Feet Assistive device: 2 person hand held assist Gait Pattern/deviations: Step-through pattern;Drifts right/left Gait velocity: wfl   General Gait Details: mildly unsteady but no overt episodes of LOB, +SOB due to EF 15-20%, required 1 seated rest break x 1 min  Stairs Stairs: Yes Stairs assistance: Min  assist Stair Management: One rail Left Number of Stairs: 6 General stair comments: pt's R knee gave way on stairs requiring minA to help regain balance, however pt able to recover with use of hand rail, pt reports "my R knee does that alot"  Wheelchair Mobility    Modified Rankin (Stroke Patients Only)       Balance                                 Standardized Balance Assessment Standardized Balance Assessment : Dynamic Gait Index   Dynamic Gait Index Level Surface: Mild Impairment Change in Gait Speed: Mild Impairment Gait with Horizontal Head Turns: Mild Impairment Gait with Vertical Head Turns: Mild Impairment Gait and Pivot Turn: Mild Impairment Step Over Obstacle: Mild Impairment Step Around Obstacles: Mild Impairment Steps: Moderate Impairment Total Score: 15       Pertinent Vitals/Pain Pain Assessment: No/denies pain    Home Living Family/patient expects to be discharged to:: Private residence Living Arrangements: Spouse/significant other Available Help at Discharge: Family;Available 24 hours/day Type of Home: House Home Access: Stairs to enter Entrance Stairs-Rails: Right Entrance Stairs-Number of Steps: 3 Home Layout: One level Home Equipment: Walker - 2 wheels;Cane - single point;Tub bench      Prior Function Level of Independence: Independent         Comments: only uses cane in crowds     Hand Dominance   Dominant Hand: Right    Extremity/Trunk Assessment   Upper Extremity Assessment: Overall WFL for tasks assessed  Lower Extremity Assessment: Overall WFL for tasks assessed      Cervical / Trunk Assessment: Normal  Communication   Communication: No difficulties  Cognition Arousal/Alertness: Awake/alert Behavior During Therapy: WFL for tasks assessed/performed Overall Cognitive Status: Within Functional Limits for tasks assessed                      General Comments      Exercises         Assessment/Plan    PT Assessment Patient needs continued PT services  PT Diagnosis Difficulty walking   PT Problem List Decreased strength;Decreased activity tolerance;Decreased balance;Decreased mobility  PT Treatment Interventions DME instruction;Gait training;Stair training;Functional mobility training;Therapeutic activities   PT Goals (Current goals can be found in the Care Plan section) Acute Rehab PT Goals Patient Stated Goal: home PT Goal Formulation: With patient Time For Goal Achievement: 04/18/15 Potential to Achieve Goals: Good Additional Goals Additional Goal #1: Pt to score > 19 on DGI to inidicate minimal falls risk.    Frequency Min 3X/week   Barriers to discharge        Co-evaluation               End of Session Equipment Utilized During Treatment: Gait belt Activity Tolerance: Patient tolerated treatment well Patient left: in chair;with call bell/phone within reach Nurse Communication: Mobility status    Functional Assessment Tool Used: clinical jdugement Functional Limitation: Mobility: Walking and moving around    Time: 1325-1340 PT Time Calculation (min) (ACUTE ONLY): 15 min   Charges:         PT G Codes:   PT G-Codes **NOT FOR INPATIENT CLASS** Functional Assessment Tool Used: clinical jdugement Functional Limitation: Mobility: Walking and moving around    Amorita, [facility] 04/11/2015, 5:08 PM  Kittie Plater, PT, DPT Pager #: [phone] Office #: [phone]

## 2015-04-12 ENCOUNTER — Observation Stay (HOSPITAL_BASED_OUTPATIENT_CLINIC_OR_DEPARTMENT_OTHER): Payer: Commercial Managed Care - HMO

## 2015-04-12 DIAGNOSIS — I5042 Chronic combined systolic (congestive) and diastolic (congestive) heart failure: Secondary | ICD-10-CM | POA: Diagnosis not present

## 2015-04-12 DIAGNOSIS — G629 Polyneuropathy, unspecified: Secondary | ICD-10-CM | POA: Diagnosis not present

## 2015-04-12 DIAGNOSIS — S0101XA Laceration without foreign body of scalp, initial encounter: Secondary | ICD-10-CM | POA: Diagnosis not present

## 2015-04-12 DIAGNOSIS — G4733 Obstructive sleep apnea (adult) (pediatric): Secondary | ICD-10-CM | POA: Diagnosis not present

## 2015-04-12 DIAGNOSIS — N390 Urinary tract infection, site not specified: Secondary | ICD-10-CM | POA: Diagnosis not present

## 2015-04-12 DIAGNOSIS — R2240 Localized swelling, mass and lump, unspecified lower limb: Secondary | ICD-10-CM | POA: Diagnosis not present

## 2015-04-12 DIAGNOSIS — E782 Mixed hyperlipidemia: Secondary | ICD-10-CM | POA: Diagnosis not present

## 2015-04-12 DIAGNOSIS — I2510 Atherosclerotic heart disease of native coronary artery without angina pectoris: Secondary | ICD-10-CM

## 2015-04-12 DIAGNOSIS — I5023 Acute on chronic systolic (congestive) heart failure: Secondary | ICD-10-CM | POA: Diagnosis not present

## 2015-04-12 DIAGNOSIS — R011 Cardiac murmur, unspecified: Secondary | ICD-10-CM | POA: Diagnosis not present

## 2015-04-12 DIAGNOSIS — I10 Essential (primary) hypertension: Secondary | ICD-10-CM | POA: Diagnosis not present

## 2015-04-12 DIAGNOSIS — I255 Ischemic cardiomyopathy: Secondary | ICD-10-CM

## 2015-04-12 DIAGNOSIS — R55 Syncope and collapse: Secondary | ICD-10-CM | POA: Diagnosis not present

## 2015-04-12 DIAGNOSIS — M109 Gout, unspecified: Secondary | ICD-10-CM | POA: Diagnosis not present

## 2015-04-12 LAB — BASIC METABOLIC PANEL
ANION GAP: 12 (ref 5–15)
BUN: 21 mg/dL — ABNORMAL HIGH (ref 6–20)
CHLORIDE: 109 mmol/L (ref 101–111)
CO2: 21 mmol/L — AB (ref 22–32)
Calcium: 8.9 mg/dL (ref 8.9–10.3)
Creatinine, Ser: 2.13 mg/dL — ABNORMAL HIGH (ref 0.61–1.24)
GFR calc non Af Amer: 30 mL/min — ABNORMAL LOW (ref >60)
GFR, EST AFRICAN AMERICAN: 35 mL/min — AB (ref >60)
Glucose, Bld: 148 mg/dL — ABNORMAL HIGH (ref 65–99)
POTASSIUM: 4.1 mmol/L (ref 3.5–5.1)
Sodium: 142 mmol/L (ref 135–145)

## 2015-04-12 LAB — GLUCOSE, CAPILLARY
GLUCOSE-CAPILLARY: 146 mg/dL — AB (ref 65–99)
GLUCOSE-CAPILLARY: 177 mg/dL — AB (ref 65–99)
GLUCOSE-CAPILLARY: 163 mg/dL — AB (ref 65–99)
GLUCOSE-CAPILLARY: 105 mg/dL — AB (ref 65–99)

## 2015-04-12 LAB — HEMOGLOBIN A1C
Hgb A1c MFr Bld: 9.7 % — ABNORMAL HIGH (ref 4.8–5.6)
MEAN PLASMA GLUCOSE: 232 mg/dL

## 2015-04-12 MED ORDER — CEPHALEXIN 500 MG PO CAPS: 500.0000 mg | ORAL_CAPSULE | Freq: Two times a day (BID) | ORAL | Status: DC

## 2015-04-12 MED ORDER — DEXTROSE 5 % IV SOLN
1.0000 g | INTRAVENOUS | Status: DC
  Administered 2015-04-12 – 2015-04-14 (×3): 1 g via INTRAVENOUS
  Filled 2015-04-12 (×3): qty 10

## 2015-04-12 NOTE — Progress Notes (Signed)
[facility]                                                                              Patient Demographics  [name] [name], is a 69 y.o. male, DOB - [birth year], [id number]  Admit date - 04/10/2015   Admitting Physician Bobette Mo, MD  Outpatient Primary MD for the patient is Thomos Lemons, DO  [facility] -    Chief Complaint  Patient presents with  . Fall       Brief HPI   [name] [name] is a 69 y.o. male with a past medical history of coronary atherosclerosis, CHF with an EF 15-20 % in 11/2013, CVA, hypertension, type 2 diabetes with peripheral neuropathy, CKD, hyperlipidemia who comes to the emergency department after having syncopal episode while Standing in line at [facility] and sustaining a head laceration in the back of his head. Until just a few days ago, the patient had increased lower extremity edema for about 2 weeks, he was on an increased dose of furosemide for a week, as suggested by his cardiologist The patient was admitted for further workup.   Assessment & Plan    Principal Problem:   Syncope in the setting of ischemic cardiomyopathy, CHF with EF of 15-20%, recently increased dose of furosemide, elevated BNP 2568, troponin 0.09 - Serial cardiac enzymes obtained, troponin trended up to 0.09, cardiology consult obtained, appreciate recommendations - Follow carotid Dopplers, continue aspirin, Imdur, lisinopril, beta blocker - 2-D echo showed EF of 15% with diffuse hypokinesis septal and apical akinesis, cardiology following  Active Problems: AKI on CKD - Patient was taking benazepril and lisinopril prior to admission, also on diuretics (Lasix, Aldactone). Presented with syncope, recently increased dose of furosemide - Cr worsened today to 2.1, hold ACEI, lasix, aldactone  -  Recheck BMET in a.m.     HYPERLIPIDEMIA - Continue statin    Essential hypertension - Currently stable, continue Imdur, beta blocker    Type 2 diabetes mellitus  with diabetic nephropathy (HCC) - Continue sliding scale insulin    UTI (lower urinary tract infection) - Follow urine culture and sensitivities, continue IV Rocephin  Deconditioning Follow PT evaluation  Mild acute on Chronic systolic CHF - Creatinine worsened to 2.1 today, holding furosemide, Aldactone, awaiting further cardiology recommendations  Code Status: Full code  Family Communication: Discussed in detail with the patient, all imaging results, lab results explained to the patient   Disposition Plan:   Time Spent in minutes   25 Minutes  Procedures  ECHO  Consults   CARDIOLOGY  DVT Prophylaxis   SCD's  Medications  Scheduled Meds: . aspirin EC  81 mg Oral Daily  . cefTRIAXone (ROCEPHIN)  IV  1 g Intravenous Q24H  . insulin aspart  0-15 Units Subcutaneous TID WC  . insulin aspart  4 Units Subcutaneous TID WC  . insulin glargine  5 Units Subcutaneous QHS  . isosorbide mononitrate  30 mg Oral Daily  . nebivolol  10 mg Oral BID  . pantoprazole  40 mg Oral Daily  . rosuvastatin  40 mg Oral Daily  . sodium chloride  3 mL Intravenous Q12H  .  sodium chloride  3 mL Intravenous Q12H  . spironolactone  25 mg Oral Daily   Continuous Infusions:  PRN Meds:.sodium chloride, acetaminophen **OR** acetaminophen, ondansetron **OR** ondansetron (ZOFRAN) IV, sodium chloride   Antibiotics   Anti-infectives    Start     Dose/Rate Route Frequency Ordered Stop   04/12/15 1015  cefTRIAXone (ROCEPHIN) 1 g in dextrose 5 % 50 mL IVPB     1 g 100 mL/hr over 30 Minutes Intravenous Every 24 hours 04/12/15 1007     04/12/15 0900  cephALEXin (KEFLEX) capsule 500 mg  Status:  Discontinued     500 mg Oral Every 12 hours 04/12/15 0822 04/12/15 1007   04/12/15 0000  cephALEXin (KEFLEX) 500 MG capsule     500 mg Oral 2 times daily 04/12/15 0824     04/11/15 1800  cefTRIAXone (ROCEPHIN) 1 g in dextrose 5 % 50 mL IVPB  Status:  Discontinued     1 g 100 mL/hr over 30 Minutes Intravenous  Every 24 hours 04/10/15 2004 04/12/15 0821   04/10/15 1830  cefTRIAXone (ROCEPHIN) 1 g in dextrose 5 % 50 mL IVPB     1 g 100 mL/hr over 30 Minutes Intravenous  Once 04/10/15 1824 04/10/15 1938        Subjective:   [name] [name] was seen and examined today. No new complaints. No fevers or chills. Patient denies dizziness, chest pain, shortness of breath, abdominal pain, N/V/D/C, new weakness, numbess, tingling. No acute events overnight.    Objective:   Blood pressure 116/68, pulse 80, temperature 98.2 F (36.8 C), temperature source Oral, resp. rate 18, height 6\' 2"  (1.88 m), weight 89.7 kg (197 lb 12 oz), SpO2 100 %.  Wt Readings from Last 3 Encounters:  04/11/15 89.7 kg (197 lb 12 oz)  05/18/14 86.183 kg (190 lb)  03/22/14 87.454 kg (192 lb 12.8 oz)     Intake/Output Summary (Last 24 hours) at 04/12/15 1007 Last data filed at 04/12/15 0900  Gross per 24 hour  Intake    840 ml  Output   1475 ml  Net   -635 ml    Exam  General: Alert and oriented x 3, NAD  HEENT:  PERRLA, EOMI, Anicteric Sclera, mucous membranes moist.   Neck: Supple, no JVD, no masses  CVS: S1 S2 auscultated, no rubs, murmurs or gallops. Regular rate and rhythm.  Respiratory: Clear to auscultation bilaterally, no wheezing, rales or rhonchi  Abdomen: Soft, nontender, nondistended, + bowel sounds  Ext: no cyanosis clubbing, 1-2+ edema  Neuro: no new deficits  Skin: No rashes  Psych: Normal affect and demeanor, alert and oriented x3    Data Review   Micro Results No results found for this or any previous visit (from the past 240 hour(s)).  Radiology Reports Dg Chest 2 View  04/10/2015  CLINICAL DATA:  Syncopal episode arrest from 1 hour ago. History of hypertension and diabetes. EXAM: CHEST  2 VIEW COMPARISON:  08/18/2013. FINDINGS: Mild enlargement of the cardiopericardial silhouette. No mediastinal or hilar masses or convincing adenopathy. Left anterior chest wall AICD is stable and  well positioned. Clear lungs.  No pleural effusion or pneumothorax. Bony thorax is demineralized but grossly intact. IMPRESSION: No acute cardiopulmonary disease. Electronically Signed   By: Amie Portland M.D.   On: 04/10/2015 16:58   Ct Head Wo Contrast  04/10/2015  CLINICAL DATA:  Had syncopal episode at [employer], struck back of head when he hit the floor, laceration, history hypertension, coronary artery  disease post MI, ischemic cardiomyopathy, type II diabetes mellitus, stroke EXAM: CT HEAD WITHOUT CONTRAST TECHNIQUE: Contiguous axial images were obtained from the base of the skull through the vertex without intravenous contrast. COMPARISON:  09/06/2011 FINDINGS: Generalized atrophy. Stable ventricular morphology. No midline shift or mass effect. Small vessel chronic ischemic changes of deep cerebral white matter. No intracranial hemorrhage, mass lesion, or evidence acute infarction. No extra-axial fluid collections. Visualized paranasal sinuses and mastoid air cells clear. Calvaria intact. IMPRESSION: No acute intracranial abnormalities. Electronically Signed   By: Ulyses Southward M.D.   On: 04/10/2015 17:06    CBC  Recent Labs Lab 04/10/15 1613  WBC 5.0  HGB 12.6*  HCT 41.0  PLT 161  MCV 91.1  MCH 28.0  MCHC 30.7  RDW 14.7  LYMPHSABS 0.4*  MONOABS 0.5  EOSABS 0.1  BASOSABS 0.0    Chemistries   Recent Labs Lab 04/10/15 1613 04/10/15 2202 04/11/15 0400 04/12/15 0744  NA 139  --  141 142  K 4.6  --  4.1 4.1  CL 111  --  111 109  CO2 20*  --  23 21*  GLUCOSE 405*  --  139* 148*  BUN 20  --  19 21*  CREATININE 1.92*  --  1.90* 2.13*  CALCIUM 8.8*  --  8.7* 8.9  MG  --  1.9  --   --    ------------------------------------------------------------------------------------------------------------------ estimated creatinine clearance is 38.6 mL/min (by C-G formula based on Cr of  2.13). ------------------------------------------------------------------------------------------------------------------  Recent Labs  04/11/15 0400  HGBA1C 9.7*   ------------------------------------------------------------------------------------------------------------------ No results for input(s): CHOL, HDL, LDLCALC, TRIG, CHOLHDL, LDLDIRECT in the last 72 hours. ------------------------------------------------------------------------------------------------------------------ No results for input(s): TSH, T4TOTAL, T3FREE, THYROIDAB in the last 72 hours.  Invalid input(s): FREET3 ------------------------------------------------------------------------------------------------------------------ No results for input(s): VITAMINB12, FOLATE, FERRITIN, TIBC, IRON, RETICCTPCT in the last 72 hours.  Coagulation profile No results for input(s): INR, PROTIME in the last 168 hours.  No results for input(s): DDIMER in the last 72 hours.  Cardiac Enzymes  Recent Labs Lab 04/10/15 1613 04/10/15 2202 04/11/15 0400  TROPONINI 0.07* 0.08* 0.09*   ------------------------------------------------------------------------------------------------------------------ Invalid input(s): POCBNP   Recent Labs  04/10/15 2245 04/11/15 0737 04/11/15 1139 04/11/15 1618 04/11/15 2046 04/12/15 0738  GLUCAP 343* 171* 224* 256* 175* 146*     Ebonye Reade M.D. [facility] 04/12/2015, 10:07 AM  Pager: [phone] Between 7am to 7pm - call Pager - [phone]  After 7pm go to [email or web address] - password [email or web address]  Call night coverage person covering after 7pm

## 2015-04-12 NOTE — Care Management Note (Signed)
Case Management Note  Patient Details  Name: LAWSEN ARNOTT MRN: [id number] Date of Birth: 06-30-1945  Subjective/Objective:            CM following for progression and d/c planning.        Action/Plan: 04/12/2015 Met with pt to arrange [facility] services as ordered by MD. List provided, pt states that his wife has recently used [facility]  And he wishes to use that services as well. [place] notified of plan to d/c this pt on 04/13/2015.  Expected Discharge Date:    04/13/2015              Expected Discharge Plan:  [place]  In-House Referral:  NA  Discharge planning Services  CM Consult  Post Acute Care Choice:  NA Choice offered to:  Patient  DME Arranged:  N/A DME Agency:  NA  HH Arranged:  RN, PT [place] Agency:  [place]  Status of Service:  Completed, signed off  Medicare Important Message Given:    Date Medicare IM Given:    Medicare IM give by:    Date Additional Medicare IM Given:    Additional Medicare Important Message give by:     If discussed at [place] of Stay Meetings, dates discussed:    Additional Comments:  Adron Bene, RN 04/12/2015, 4:18 PM

## 2015-04-12 NOTE — Progress Notes (Signed)
Patient Name: CAMILLE SHILLITO Date of Encounter: 04/12/2015   SUBJECTIVE  Had carotid US this morning. Feeling well. No chest pain, sob or palpitations.   CURRENT MEDS . aspirin EC  81 mg Oral Daily  . cefTRIAXone (ROCEPHIN)  IV  1 g Intravenous Q24H  . insulin aspart  0-15 Units Subcutaneous TID WC  . insulin aspart  4 Units Subcutaneous TID WC  . insulin glargine  5 Units Subcutaneous QHS  . isosorbide mononitrate  30 mg Oral Daily  . nebivolol  10 mg Oral BID  . pantoprazole  40 mg Oral Daily  . rosuvastatin  40 mg Oral Daily  . sodium chloride  3 mL Intravenous Q12H  . sodium chloride  3 mL Intravenous Q12H    OBJECTIVE  Filed Vitals:   04/11/15 1708 04/11/15 2053 04/12/15 0430 04/12/15 0956  BP: 110/75 112/65 118/60 116/68  Pulse: 85 82 79 80  Temp: 98.4 F (36.9 C) 98.2 F (36.8 C) 98.6 F (37 C) 98.2 F (36.8 C)  TempSrc: Oral Oral Oral Oral  Resp: 20 18 19 18   Height:      Weight:  197 lb 12 oz (89.7 kg)    SpO2: 99% 100% 100% 100%    Intake/Output Summary (Last 24 hours) at 04/12/15 1026 Last data filed at 04/12/15 0900  Gross per 24 hour  Intake    840 ml  Output   1475 ml  Net   -635 ml   Filed Weights   04/10/15 1535 04/10/15 2000 04/11/15 2053  Weight: 200 lb (90.719 kg) 197 lb (89.359 kg) 197 lb 12 oz (89.7 kg)    PHYSICAL EXAM  General: Pleasant, NAD. Neuro: Alert and oriented X 3. Moves all extremities spontaneously. Psych: Normal affect. HEENT:  Normal  Neck: Supple without bruits or JVD. Lungs:  Resp regular and unlabored, CTA. Heart: RRR no s3, s4, or murmurs. Abdomen: Soft, non-tender, non-distended, BS + x 4.  Extremities: No clubbing, cyanosis. 1+ BL LE edema. DP/PT/Radials 2+ and equal bilaterally.  Accessory Clinical Findings  CBC  Recent Labs  04/10/15 1613  WBC 5.0  NEUTROABS 4.0  HGB 12.6*  HCT 41.0  MCV 91.1  PLT [id number]   Basic Metabolic Panel  Recent Labs  04/10/15 2202 04/11/15 0400 04/12/15 0744  NA   --  141 142  K  --  4.1 4.1  CL  --  111 109  CO2  --  23 21*  GLUCOSE  --  139* 148*  BUN  --  19 21*  CREATININE  --  1.90* 2.13*  CALCIUM  --  8.7* 8.9  MG 1.9  --   --   Cardiac Enzymes  Recent Labs  04/10/15 1613 04/10/15 2202 04/11/15 0400  TROPONINI 0.07* 0.08* 0.09*   Hemoglobin A1C  Recent Labs  04/11/15 0400  HGBA1C 9.7*    TELE  Sinus rhythm with PVCs  Radiology/Studies  Dg Chest 2 View  04/10/2015  CLINICAL DATA:  Syncopal episode arrest from 1 hour ago. History of hypertension and diabetes. EXAM: CHEST  2 VIEW COMPARISON:  08/18/2013. FINDINGS: Mild enlargement of the cardiopericardial silhouette. No mediastinal or hilar masses or convincing adenopathy. Left anterior chest wall AICD is stable and well positioned. Clear lungs.  No pleural effusion or pneumothorax. Bony thorax is demineralized but grossly intact. IMPRESSION: No acute cardiopulmonary disease. Electronically Signed   By: Lajean Manes M.D.   On: 04/10/2015 16:58   Ct Head Wo Contrast  04/10/2015  CLINICAL DATA:  Had syncopal episode at [employer], struck back of head when he hit the floor, laceration, history hypertension, coronary artery disease post MI, ischemic cardiomyopathy, type II diabetes mellitus, stroke EXAM: CT HEAD WITHOUT CONTRAST TECHNIQUE: Contiguous axial images were obtained from the base of the skull through the vertex without intravenous contrast. COMPARISON:  09/06/2011 FINDINGS: Generalized atrophy. Stable ventricular morphology. No midline shift or mass effect. Small vessel chronic ischemic changes of deep cerebral white matter. No intracranial hemorrhage, mass lesion, or evidence acute infarction. No extra-axial fluid collections. Visualized paranasal sinuses and mastoid air cells clear. Calvaria intact. IMPRESSION: No acute intracranial abnormalities. Electronically Signed   By: Lavonia Dana M.D.   On: 04/10/2015 17:06    Echo 04/11/2015 LV EF:  15%  ------------------------------------------------------------------- Indications:   Syncope 780.2.  ------------------------------------------------------------------- History:  PMH:  Syncope. Congestive heart failure. PMH: Myocardial infarction. Risk factors: Hypertension. Diabetes mellitus. Dyslipidemia.  ------------------------------------------------------------------- Study Conclusions  - Left ventricle: Diffuse hypokinesis septal and apical akinesis. The cavity size was severely dilated. Wall thickness was normal. The estimated ejection fraction was 15%. - Mitral valve: Calcified annulus. Mildly thickened leaflets . There was mild regurgitation. - Left atrium: The atrium was moderately dilated. - Atrial septum: No defect or patent foramen ovale was identified. - Tricuspid valve: There was moderate regurgitation  ASSESSMENT AND PLAN  1. Syncope - seen by EP yesterday and felt vasodepressor syncope.  -  ICD check shows normal function without detected arrhythmias, no therapies.  - Preliminary result of carotid doppler - Bilateral: 1-39% ICA stenosis. Vertebral artery flow is antegrade.   - No driving for 6 months.   2. ICM with an ICD - No arrhthymias - Echo 12/19 showed  LV ef of 15-20%w with diffuse hypokinesis septal and apical akinesis. LV cavity size was severely dilated. Mild MR. Moderate dilated LA. Moderate TR. - BNP of 2568.  However CXr clear.  - Held  ACEI, lasix, aldactone due to elevated creatinine of 2.13 today from 1.9. Started on gentle hydration 54ml/hours. 1-2+ BL LE edema. Watch closely for volume overload. The patients takes lasix 60mg  qd at home. Needs to adjust dosage at discharge pending reassessment tomorrow.  - Continue asa 81mg , imdur 30mg , bystolic 10mg  bid and creastor 40mg .   3. HTN - stable and well controlled  4. HL - continue statin    Obstructive sleep apnea   Type 2 diabetes mellitus with diabetic nephropathy  ([place])   UTI (lower urinary tract infection)      Signed, Johany Hansman PA-C Pager [phone]

## 2015-04-12 NOTE — Progress Notes (Signed)
VASCULAR LAB PRELIMINARY  PRELIMINARY  PRELIMINARY  PRELIMINARY   Bilateral:  1-39% ICA stenosis.  Vertebral artery flow is antegrade.     Taysha Majewski, RVT, RDMS 04/12/2015, 11:03 AM

## 2015-04-13 DIAGNOSIS — I255 Ischemic cardiomyopathy: Secondary | ICD-10-CM | POA: Diagnosis not present

## 2015-04-13 DIAGNOSIS — I10 Essential (primary) hypertension: Secondary | ICD-10-CM | POA: Diagnosis not present

## 2015-04-13 DIAGNOSIS — S0101XA Laceration without foreign body of scalp, initial encounter: Secondary | ICD-10-CM | POA: Diagnosis not present

## 2015-04-13 DIAGNOSIS — N390 Urinary tract infection, site not specified: Secondary | ICD-10-CM | POA: Diagnosis not present

## 2015-04-13 DIAGNOSIS — I5023 Acute on chronic systolic (congestive) heart failure: Secondary | ICD-10-CM | POA: Diagnosis present

## 2015-04-13 DIAGNOSIS — R55 Syncope and collapse: Secondary | ICD-10-CM | POA: Diagnosis not present

## 2015-04-13 DIAGNOSIS — R2240 Localized swelling, mass and lump, unspecified lower limb: Secondary | ICD-10-CM | POA: Diagnosis not present

## 2015-04-13 DIAGNOSIS — N189 Chronic kidney disease, unspecified: Secondary | ICD-10-CM

## 2015-04-13 DIAGNOSIS — G629 Polyneuropathy, unspecified: Secondary | ICD-10-CM | POA: Diagnosis not present

## 2015-04-13 DIAGNOSIS — M109 Gout, unspecified: Secondary | ICD-10-CM | POA: Diagnosis not present

## 2015-04-13 DIAGNOSIS — E1121 Type 2 diabetes mellitus with diabetic nephropathy: Secondary | ICD-10-CM

## 2015-04-13 DIAGNOSIS — R011 Cardiac murmur, unspecified: Secondary | ICD-10-CM | POA: Diagnosis not present

## 2015-04-13 DIAGNOSIS — I5042 Chronic combined systolic (congestive) and diastolic (congestive) heart failure: Secondary | ICD-10-CM | POA: Diagnosis not present

## 2015-04-13 DIAGNOSIS — I2510 Atherosclerotic heart disease of native coronary artery without angina pectoris: Secondary | ICD-10-CM | POA: Diagnosis not present

## 2015-04-13 DIAGNOSIS — Z95810 Presence of automatic (implantable) cardiac defibrillator: Secondary | ICD-10-CM | POA: Diagnosis not present

## 2015-04-13 LAB — BASIC METABOLIC PANEL
ANION GAP: 12 (ref 5–15)
ANION GAP: 8 (ref 5–15)
BUN: 28 mg/dL — ABNORMAL HIGH (ref 6–20)
BUN: 29 mg/dL — ABNORMAL HIGH (ref 6–20)
CALCIUM: 8.8 mg/dL — AB (ref 8.9–10.3)
CHLORIDE: 113 mmol/L — AB (ref 101–111)
CHLORIDE: 113 mmol/L — AB (ref 101–111)
CO2: 21 mmol/L — AB (ref 22–32)
CO2: 23 mmol/L (ref 22–32)
CREATININE: 2 mg/dL — AB (ref 0.61–1.24)
Calcium: 8.6 mg/dL — ABNORMAL LOW (ref 8.9–10.3)
Creatinine, Ser: 2.08 mg/dL — ABNORMAL HIGH (ref 0.61–1.24)
GFR calc Af Amer: 36 mL/min — ABNORMAL LOW (ref >60)
GFR calc non Af Amer: 31 mL/min — ABNORMAL LOW (ref >60)
GFR calc non Af Amer: 33 mL/min — ABNORMAL LOW (ref >60)
GFR, EST AFRICAN AMERICAN: 38 mL/min — AB (ref >60)
GLUCOSE: 131 mg/dL — AB (ref 65–99)
Glucose, Bld: 193 mg/dL — ABNORMAL HIGH (ref 65–99)
POTASSIUM: 4.5 mmol/L (ref 3.5–5.1)
Potassium: 4.5 mmol/L (ref 3.5–5.1)
Sodium: 146 mmol/L — ABNORMAL HIGH (ref 135–145)
Sodium: 144 mmol/L (ref 135–145)

## 2015-04-13 LAB — GLUCOSE, CAPILLARY
Glucose-Capillary: 107 mg/dL — ABNORMAL HIGH (ref 65–99)
Glucose-Capillary: 165 mg/dL — ABNORMAL HIGH (ref 65–99)
Glucose-Capillary: 154 mg/dL — ABNORMAL HIGH (ref 65–99)
Glucose-Capillary: 103 mg/dL — ABNORMAL HIGH (ref 65–99)

## 2015-04-13 MED ORDER — HYDRALAZINE HCL 10 MG PO TABS
10.0000 mg | ORAL_TABLET | Freq: Three times a day (TID) | ORAL | Status: DC
  Administered 2015-04-13 – 2015-04-14 (×3): 10 mg via ORAL
  Filled 2015-04-13 (×3): qty 1

## 2015-04-13 NOTE — Progress Notes (Signed)
Physical Therapy Treatment Patient Details Name: [name] [name] MRN: [id number] DOB: 08/06/1945 Today's Date: 04/13/2015    History of Present Illness [name] [name] is a 69 y.o. male with a past medical history of coronary atherosclerosis, CHF with an EF 15-20 % in 11/2013, CVA, hypertension, type 2 diabetes with peripheral neuropathy, CKD, hyperlipidemia who comes to the emergency department after having a fall when standing in line at [facility] and sustaining a head laceration in the back of his head.     PT Comments    Pt with improved ambulation tolerance and improved stability with ambulation but remains to have higher level balance deficits as noted in balance section. Pt remains safe to d/c home with wife once medically stable.  Follow Up Recommendations  Home health PT;Supervision/Assistance - 24 hour     Equipment Recommendations  None recommended by PT    Recommendations for Other Services       Precautions / Restrictions Precautions Precautions: Fall Precaution Comments: EF 15-20% Restrictions Weight Bearing Restrictions: No    Mobility  Bed Mobility               General bed mobility comments: pt up in chair  Transfers Overall transfer level: Needs assistance Equipment used: None Transfers: Sit to/from Stand Sit to Stand: Modified independent (Device/Increase time)            Ambulation/Gait Ambulation/Gait assistance: Min guard Ambulation Distance (Feet): 400 Feet Assistive device: None Gait Pattern/deviations: Step-through pattern Gait velocity: wfl   General Gait Details: mildly unsteady   Stairs Stairs: Yes Stairs assistance: Min assist Stair Management: One rail Left Number of Stairs: 6 General stair comments: pt's R knee gave way on stairs requiring minA to help regain balance, however pt able to recover with use of hand rail, pt reports "my R knee does that alot"  Wheelchair Mobility    Modified Rankin (Stroke Patients  Only)       Balance Overall balance assessment: Needs assistance               Single Leg Stance - Right Leg:  (<10 sec) Single Leg Stance - Left Leg:  (<5sec) Tandem Stance - Right Leg:  (5 sec) Tandem Stance - Left Leg:  (5 sec)            Cognition Arousal/Alertness: Awake/alert Behavior During Therapy: WFL for tasks assessed/performed Overall Cognitive Status: Within Functional Limits for tasks assessed                      Exercises General Exercises - Lower Extremity Mini-Sqauts: AROM;20 reps;Standing    General Comments        Pertinent Vitals/Pain Pain Assessment: No/denies pain    Home Living                      Prior Function            PT Goals (current goals can now be found in the care plan section) Progress towards PT goals: Progressing toward goals    Frequency  Min 2X/week    PT Plan Frequency needs to be updated    Co-evaluation             End of Session Equipment Utilized During Treatment: Gait belt Activity Tolerance: Patient tolerated treatment well Patient left: in chair;with call bell/phone within reach     Time: [id number] PT Time Calculation (min) (ACUTE ONLY): 16 min  Charges:  $Gait Training:  8-22 mins                    G Codes:      Kingsley Callander 04/13/2015, 5:11 PM   Kittie Plater, PT, DPT Pager #: [phone] Office #: [phone]

## 2015-04-13 NOTE — Progress Notes (Signed)
[facility] Progress Note    Patient: [name] [name]    [id number]  DOB: Jan 23, 1946     DOA: 04/10/2015 Date of Service: the patient was seen and examined on 04/13/2015  Subjective:  Patient complains of swelling in the leg present for last 1 week. Denies any complains of shortness of breath chest pain dizziness lightheadedness. No further episodes of syncope. Nutrition:  Able to tolerate oral diet Activity:  Ambulating in the hallway Last BM:  04/12/2015  Assessment and Plan: 1. Syncope  most likely vasovagal   cardiology consulted appreciated input.  Lasix dose will be reduced recommendation.  ICD without any arrhythmia or therapy.  carotid Doppler unremarkable.   Cardiology recommend no driving for 6 months  2. Ischemic cardiopathy with ICD.  acute on chronic systolic CHF.  cardiology consulted and will follow recommendation.   His ejection fraction 15-20% diffuse hypokinesis. Renal function worsening. He initially was given gentle hydration. Now has bilateral leg swelling.  Continue Imdur, continue bystolic,  Started on hydralazine per cardiology.  also need reduced dose of Lasix.  continue TED stockings.  reevaluate in the morning.  3.  Lab Error Sodium level elevated,  Recheck within normal limits  Most likely error  4. Possible UTI. UA on admission showed do numerous to count WBC. On Keflex.  we will monitor.  Principal Problem:   Syncope Active Problems:   HYPERLIPIDEMIA   Obstructive sleep apnea   Essential hypertension   Type 2 diabetes mellitus with diabetic nephropathy (HCC)   UTI (lower urinary tract infection)   Acute on chronic systolic heart failure (HCC)  DVT Prophylaxis: subcutaneous Heparin Nutrition:  Cardiac diet Advance goals of care discussion:  Full code  Brief Summary of Hospitalization:  HPI: As per the H and P dictated on admission, "[name] [name] is a 69 y.o. male with a past medical history of coronary  atherosclerosis, CHF with an EF 15-20 % in 11/2013, CVA, hypertension, type 2 diabetes with peripheral neuropathy, CKD, hyperlipidemia who comes to the emergency department after having a fall when standing in line at [facility] and sustaining a head laceration in the back of his head. He denies having any symptoms prior to the fall or after the fall. No chest pain, palpitations, dizziness, diaphoresis, dyspnea or nausea. He states that he only remembers being in line, then waking up on the floor, seeing his family and paramedics.   Until just a few days ago, the patient had increased lower extremity edema for about 2 weeks, he was on an increased dose of furosemide for a week, as suggested by his cardiologist. He states that he was having dyspnea on exertion, also had orthopnea and a couple episodes of PND during that time, but feels better now. He denies having chest pain, palpitations, dizziness, diaphoresis, nausea or emesis during this time as well.   When seen in the emergency department, he was in no acute distress. " Daily update:  Admitted on 04/10/2015,  EP consulted on 04/11/2015 Consultants:  Electrophysiology, cardiology Procedures:  echocardiogram Antibiotics: Anti-infectives    Start     Dose/Rate Route Frequency Ordered Stop   04/12/15 1100  cefTRIAXone (ROCEPHIN) 1 g in dextrose 5 % 50 mL IVPB     1 g 100 mL/hr over 30 Minutes Intravenous Every 24 hours 04/12/15 1007     04/12/15 0900  cephALEXin (KEFLEX) capsule 500 mg  Status:  Discontinued     500 mg Oral Every 12 hours 04/12/15 0822 04/12/15 1007  04/12/15 0000  cephALEXin (KEFLEX) 500 MG capsule     500 mg Oral 2 times daily 04/12/15 0824     04/11/15 1800  cefTRIAXone (ROCEPHIN) 1 g in dextrose 5 % 50 mL IVPB  Status:  Discontinued     1 g 100 mL/hr over 30 Minutes Intravenous Every 24 hours 04/10/15 2004 04/12/15 0821   04/10/15 1830  cefTRIAXone (ROCEPHIN) 1 g in dextrose 5 % 50 mL IVPB     1 g 100 mL/hr over 30  Minutes Intravenous  Once 04/10/15 1824 04/10/15 1938       Family Communication:  No family was present at bedside, at the time of interview.  Opportunity was given to ask question and all questions were answered satisfactorily.   Disposition:  Expected discharge date: 04/14/2015 Barriers to safe discharge:  Renal function and diuresis   Intake/Output Summary (Last 24 hours) at 04/13/15 1536 Last data filed at 04/13/15 1300  Gross per 24 hour  Intake   1010 ml  Output    350 ml  Net    660 ml   Filed Weights   04/10/15 2000 04/11/15 2053 04/12/15 2034  Weight: 89.359 kg (197 lb) 89.7 kg (197 lb 12 oz) 90.1 kg (198 lb 10.2 oz)    Objective: Physical Exam: Filed Vitals:   04/12/15 1732 04/12/15 2034 04/13/15 0416 04/13/15 0950  BP: 108/81 110/78 108/85 127/95  Pulse: 76 77 74 78  Temp: 98.3 F (36.8 C) 98.4 F (36.9 C) 98.5 F (36.9 C) 98.4 F (36.9 C)  TempSrc: Oral Oral Oral Oral  Resp: 18 19 19 18   Height:      Weight:  90.1 kg (198 lb 10.2 oz)    SpO2: 100% 100% 100% 100%     General: Appear in mild distress, no Rash; Oral Mucosa moist. Cardiovascular: S1 and S2 Present,  Aortic systolic Murmur,  mild JVD Respiratory: Bilateral Air entry present and  Faint basal Crackles, no wheezes Abdomen: Bowel Sound present, Soft and no tenderness Extremities:  bilateral Pedal edema, no calf tenderness Neurology: Grossly no focal neuro deficit.  Data Reviewed: CBC:  Recent Labs Lab 04/10/15 1613  WBC 5.0  NEUTROABS 4.0  HGB 12.6*  HCT 41.0  MCV 91.1  PLT Q000111Q   Basic Metabolic Panel:  Recent Labs Lab 04/10/15 1613 04/10/15 2202 04/11/15 0400 04/12/15 0744 04/13/15 0815 04/13/15 1210  NA 139  --  141 142 146* 144  K 4.6  --  4.1 4.1 4.5 4.5  CL 111  --  111 109 113* 113*  CO2 20*  --  23 21* 21* 23  GLUCOSE 405*  --  139* 148* 131* 193*  BUN 20  --  19 21* 28* 29*  CREATININE 1.92*  --  1.90* 2.13* 2.08* 2.00*  CALCIUM 8.8*  --  8.7* 8.9 8.8* 8.6*    MG  --  1.9  --   --   --   --    Liver Function Tests: No results for input(s): AST, ALT, ALKPHOS, BILITOT, PROT, ALBUMIN in the last 168 hours. No results for input(s): LIPASE, AMYLASE in the last 168 hours. No results for input(s): AMMONIA in the last 168 hours.  Cardiac Enzymes:  Recent Labs Lab 04/10/15 1613 04/10/15 2202 04/11/15 0400  TROPONINI 0.07* 0.08* 0.09*   BNP (last 3 results)  Recent Labs  04/10/15 1613 04/11/15 0400  BNP 2401.8* 2568.2*    ProBNP (last 3 results) No results for input(s): PROBNP in the  last 8760 hours.   CBG:  Recent Labs Lab 04/12/15 1137 04/12/15 1615 04/12/15 2033 04/13/15 0756 04/13/15 1327  GLUCAP 177* 163* 105* 107* 165*    Recent Results (from the past 240 hour(s))  Urine culture     Status: None (Preliminary result)   Collection Time: 04/10/15  5:35 PM  Result Value Ref Range Status   Specimen Description URINE, RANDOM  Final   Special Requests ADDED [id number]  Final   Culture   Final    >=100,000 COLONIES/mL GROUP B STREP(S.AGALACTIAE)ISOLATED TESTING AGAINST S. AGALACTIAE NOT ROUTINELY PERFORMED DUE TO PREDICTABILITY OF AMP/PEN/VAN SUSCEPTIBILITY. CULTURE REINCUBATED FOR BETTER GROWTH    Report Status PENDING  Incomplete     Studies: No results found.   Scheduled Meds: . aspirin EC  81 mg Oral Daily  . cefTRIAXone (ROCEPHIN)  IV  1 g Intravenous Q24H  . hydrALAZINE  10 mg Oral 3 times per day  . insulin aspart  0-15 Units Subcutaneous TID WC  . insulin aspart  4 Units Subcutaneous TID WC  . insulin glargine  5 Units Subcutaneous QHS  . isosorbide mononitrate  30 mg Oral Daily  . nebivolol  10 mg Oral BID  . pantoprazole  40 mg Oral Daily  . rosuvastatin  40 mg Oral Daily  . sodium chloride  3 mL Intravenous Q12H  . sodium chloride  3 mL Intravenous Q12H   Continuous Infusions:  PRN Meds: sodium chloride, acetaminophen **OR** acetaminophen, ondansetron **OR** ondansetron (ZOFRAN) IV, sodium  chloride  Time spent: 30 minutes  Author: Berle Mull, MD [facility] Pager: [phone] 04/13/2015 3:36 PM  If 7PM-7AM, please contact night-coverage at [email or web address], password [facility]

## 2015-04-13 NOTE — Progress Notes (Signed)
Patient Name: KAULIN PARROTTE Date of Encounter: 04/13/2015   SUBJECTIVE  Feeling well. No chest pain, sob or palpitations. Wants to go home.   CURRENT MEDS . aspirin EC  81 mg Oral Daily  . cefTRIAXone (ROCEPHIN)  IV  1 g Intravenous Q24H  . insulin aspart  0-15 Units Subcutaneous TID WC  . insulin aspart  4 Units Subcutaneous TID WC  . insulin glargine  5 Units Subcutaneous QHS  . isosorbide mononitrate  30 mg Oral Daily  . nebivolol  10 mg Oral BID  . pantoprazole  40 mg Oral Daily  . rosuvastatin  40 mg Oral Daily  . sodium chloride  3 mL Intravenous Q12H  . sodium chloride  3 mL Intravenous Q12H    OBJECTIVE  Filed Vitals:   04/12/15 0956 04/12/15 1732 04/12/15 2034 04/13/15 0416  BP: 116/68 108/81 110/78 108/85  Pulse: 80 76 77 74  Temp: 98.2 F (36.8 C) 98.3 F (36.8 C) 98.4 F (36.9 C) 98.5 F (36.9 C)  TempSrc: Oral Oral Oral Oral  Resp: 18 18 19 19   Height:      Weight:   198 lb 10.2 oz (90.1 kg)   SpO2: 100% 100% 100% 100%    Intake/Output Summary (Last 24 hours) at 04/13/15 0855 Last data filed at 04/13/15 [id number]  Gross per 24 hour  Intake   1010 ml  Output    850 ml  Net    160 ml   Filed Weights   04/10/15 2000 04/11/15 2053 04/12/15 2034  Weight: 197 lb (89.359 kg) 197 lb 12 oz (89.7 kg) 198 lb 10.2 oz (90.1 kg)    PHYSICAL EXAM  General: Pleasant, NAD. Neuro: Alert and oriented X 3. Moves all extremities spontaneously. Psych: Normal affect. HEENT:  Normal  Neck: Supple without bruits or JVD. Lungs:  Resp regular and unlabored, CTA. Heart: RRR no s3, s4. Soft systolic murmur. Abdomen: Soft, non-tender, non-distended, BS + x 4.  Extremities: No clubbing, cyanosis. 1+ BL LE edema. DP/PT/Radials 2+ and equal bilaterally.  Accessory Clinical Findings  CBC  Recent Labs  04/10/15 1613  WBC 5.0  NEUTROABS 4.0  HGB 12.6*  HCT 41.0  MCV 91.1  PLT Q000111Q   Basic Metabolic Panel  Recent Labs  04/10/15 2202 04/11/15 0400  04/12/15 0744  NA  --  141 142  K  --  4.1 4.1  CL  --  111 109  CO2  --  23 21*  GLUCOSE  --  139* 148*  BUN  --  19 21*  CREATININE  --  1.90* 2.13*  CALCIUM  --  8.7* 8.9  MG 1.9  --   --   Cardiac Enzymes  Recent Labs  04/10/15 1613 04/10/15 2202 04/11/15 0400  TROPONINI 0.07* 0.08* 0.09*   Hemoglobin A1C  Recent Labs  04/11/15 0400  HGBA1C 9.7*    TELE  Sinus rhythm with PVCs  Radiology/Studies  Dg Chest 2 View  04/10/2015  CLINICAL DATA:  Syncopal episode arrest from 1 hour ago. History of hypertension and diabetes. EXAM: CHEST  2 VIEW COMPARISON:  08/18/2013. FINDINGS: Mild enlargement of the cardiopericardial silhouette. No mediastinal or hilar masses or convincing adenopathy. Left anterior chest wall AICD is stable and well positioned. Clear lungs.  No pleural effusion or pneumothorax. Bony thorax is demineralized but grossly intact. IMPRESSION: No acute cardiopulmonary disease. Electronically Signed   By: Lajean Manes M.D.   On: 04/10/2015 16:58   Ct Head Wo  Contrast  04/10/2015  CLINICAL DATA:  Had syncopal episode at [employer], struck back of head when he hit the floor, laceration, history hypertension, coronary artery disease post MI, ischemic cardiomyopathy, type II diabetes mellitus, stroke EXAM: CT HEAD WITHOUT CONTRAST TECHNIQUE: Contiguous axial images were obtained from the base of the skull through the vertex without intravenous contrast. COMPARISON:  09/06/2011 FINDINGS: Generalized atrophy. Stable ventricular morphology. No midline shift or mass effect. Small vessel chronic ischemic changes of deep cerebral white matter. No intracranial hemorrhage, mass lesion, or evidence acute infarction. No extra-axial fluid collections. Visualized paranasal sinuses and mastoid air cells clear. Calvaria intact. IMPRESSION: No acute intracranial abnormalities. Electronically Signed   By: Lavonia Dana M.D.   On: 04/10/2015 17:06    Echo 04/11/2015 LV EF:  15%  ------------------------------------------------------------------- Indications:   Syncope 780.2.  ------------------------------------------------------------------- History:  PMH:  Syncope. Congestive heart failure. PMH: Myocardial infarction. Risk factors: Hypertension. Diabetes mellitus. Dyslipidemia.  ------------------------------------------------------------------- Study Conclusions  - Left ventricle: Diffuse hypokinesis septal and apical akinesis. The cavity size was severely dilated. Wall thickness was normal. The estimated ejection fraction was 15%. - Mitral valve: Calcified annulus. Mildly thickened leaflets . There was mild regurgitation. - Left atrium: The atrium was moderately dilated. - Atrial septum: No defect or patent foramen ovale was identified. - Tricuspid valve: There was moderate regurgitation  ASSESSMENT AND PLAN  1. Syncope - seen by EP yesterday and felt vasodepressor syncope.  -  ICD check shows normal function without detected arrhythmias, no therapies.  - Preliminary result of carotid doppler - Bilateral: 1-39% ICA stenosis. Vertebral artery flow is antegrade.   - No driving for 6 months.   2. ICM with an ICD - No arrhthymias - Echo 12/19 showed  LV ef of 15-20%w with diffuse hypokinesis septal and apical akinesis. LV cavity size was severely dilated. Mild MR. Moderate dilated LA. Moderate TR. - BNP of 2568.  However CXr clear.  - Held  ACEI, lasix, aldactone due to elevated creatinine of 2.13 from 1.9. Given gentle hydration 13ml/hours, now off. 1+ BL LE edema. Will place on TED.  Pending BMET to reassess renal function today. The blood has been drawn. The patients takes lasix 60mg  qd at home. Needs to adjust dosage at discharge pending reassessment tomorrow.  - Continue asa 81mg , imdur 30mg , bystolic 10mg  bid and creastor 40mg .   3. HTN - stable and well controlled  4. HL - continue statin    Obstructive sleep apnea    Type 2 diabetes mellitus with diabetic nephropathy (HCC)   UTI (lower urinary tract infection)    Dispo: f/u with Dr. Percival Spanish 05/17/15 as schedule.   Jarrett Soho PA-C Pager [phone]

## 2015-04-14 DIAGNOSIS — G629 Polyneuropathy, unspecified: Secondary | ICD-10-CM | POA: Diagnosis not present

## 2015-04-14 DIAGNOSIS — M109 Gout, unspecified: Secondary | ICD-10-CM | POA: Diagnosis not present

## 2015-04-14 DIAGNOSIS — I5023 Acute on chronic systolic (congestive) heart failure: Secondary | ICD-10-CM

## 2015-04-14 DIAGNOSIS — R011 Cardiac murmur, unspecified: Secondary | ICD-10-CM | POA: Diagnosis not present

## 2015-04-14 DIAGNOSIS — R55 Syncope and collapse: Secondary | ICD-10-CM | POA: Diagnosis not present

## 2015-04-14 DIAGNOSIS — S0101XA Laceration without foreign body of scalp, initial encounter: Secondary | ICD-10-CM | POA: Diagnosis not present

## 2015-04-14 DIAGNOSIS — I255 Ischemic cardiomyopathy: Secondary | ICD-10-CM | POA: Diagnosis not present

## 2015-04-14 DIAGNOSIS — R2240 Localized swelling, mass and lump, unspecified lower limb: Secondary | ICD-10-CM | POA: Diagnosis not present

## 2015-04-14 DIAGNOSIS — E782 Mixed hyperlipidemia: Secondary | ICD-10-CM

## 2015-04-14 DIAGNOSIS — I5042 Chronic combined systolic (congestive) and diastolic (congestive) heart failure: Secondary | ICD-10-CM | POA: Diagnosis not present

## 2015-04-14 DIAGNOSIS — I2510 Atherosclerotic heart disease of native coronary artery without angina pectoris: Secondary | ICD-10-CM | POA: Diagnosis not present

## 2015-04-14 DIAGNOSIS — G4733 Obstructive sleep apnea (adult) (pediatric): Secondary | ICD-10-CM

## 2015-04-14 DIAGNOSIS — Z95810 Presence of automatic (implantable) cardiac defibrillator: Secondary | ICD-10-CM | POA: Diagnosis not present

## 2015-04-14 DIAGNOSIS — N390 Urinary tract infection, site not specified: Secondary | ICD-10-CM | POA: Diagnosis not present

## 2015-04-14 DIAGNOSIS — I10 Essential (primary) hypertension: Secondary | ICD-10-CM | POA: Diagnosis not present

## 2015-04-14 LAB — BASIC METABOLIC PANEL
Anion gap: 10 (ref 5–15)
BUN: 30 mg/dL — AB (ref 6–20)
CO2: 22 mmol/L (ref 22–32)
CREATININE: 2.09 mg/dL — AB (ref 0.61–1.24)
Calcium: 8.9 mg/dL (ref 8.9–10.3)
Chloride: 110 mmol/L (ref 101–111)
GFR calc Af Amer: 36 mL/min — ABNORMAL LOW (ref >60)
GFR, EST NON AFRICAN AMERICAN: 31 mL/min — AB (ref >60)
Glucose, Bld: 97 mg/dL (ref 65–99)
Potassium: 4.4 mmol/L (ref 3.5–5.1)
SODIUM: 142 mmol/L (ref 135–145)

## 2015-04-14 LAB — URINE CULTURE: Culture: >=100000

## 2015-04-14 LAB — CBC
HCT: 42.3 % (ref 39.0–52.0)
Hemoglobin: 13.1 g/dL (ref 13.0–17.0)
MCH: 27.8 pg (ref 26.0–34.0)
MCHC: 31 g/dL (ref 30.0–36.0)
MCV: 89.8 fL (ref 78.0–100.0)
PLATELETS: 193 10*3/uL (ref 150–400)
RBC: 4.71 MIL/uL (ref 4.22–5.81)
RDW: 14.5 % (ref 11.5–15.5)
WBC: 6.5 10*3/uL (ref 4.0–10.5)

## 2015-04-14 LAB — GLUCOSE, CAPILLARY
Glucose-Capillary: 81 mg/dL (ref 65–99)
Glucose-Capillary: 169 mg/dL — ABNORMAL HIGH (ref 65–99)

## 2015-04-14 MED ORDER — HYDRALAZINE HCL 10 MG PO TABS: 10.0000 mg | ORAL_TABLET | Freq: Three times a day (TID) | ORAL | Status: DC

## 2015-04-14 MED ORDER — FUROSEMIDE 40 MG PO TABS
40.0000 mg | ORAL_TABLET | Freq: Every day | ORAL | Status: DC
  Administered 2015-04-14: 40 mg via ORAL
  Filled 2015-04-14: qty 1

## 2015-04-14 MED ORDER — FUROSEMIDE 40 MG PO TABS: 40.0000 mg | ORAL_TABLET | Freq: Every day | ORAL | Status: DC

## 2015-04-14 NOTE — Progress Notes (Signed)
SUBJECTIVE:  No complaints  OBJECTIVE:   Vitals:   Filed Vitals:   04/13/15 1733 04/13/15 2156 04/14/15 0555 04/14/15 0845  BP: 148/67 108/77 119/84 128/90  Pulse: 74 75 72 75  Temp: 98 F (36.7 C) 98.1 F (36.7 C) 98 F (36.7 C) 97.8 F (36.6 C)  TempSrc: Oral Oral Oral Oral  Resp: 18 17 18 17   Height:      Weight:  198 lb 6.6 oz (90 kg)    SpO2: 100% 98% 97% 100%   I&O's:   Intake/Output Summary (Last 24 hours) at 04/14/15 1130 Last data filed at 04/14/15 1021  Gross per 24 hour  Intake   1010 ml  Output      0 ml  Net   1010 ml   TELEMETRY: Reviewed telemetry pt in NSR:     PHYSICAL EXAM General: Well developed, well nourished, in no acute distress Head: Eyes PERRLA, No xanthomas.   Normal cephalic and atramatic  Lungs:   Clear bilaterally to auscultation and percussion. Heart:   HRRR S1 S2 Pulses are 2+ & equal. Abdomen: Bowel sounds are positive, abdomen soft and non-tender without masses  Extremities:   No clubbing, cyanosis. DP +1.  1+ edema Neuro: Alert and oriented X 3. Psych:  Good affect, responds appropriately   LABS: Basic Metabolic Panel:  Recent Labs  04/13/15 1210 04/14/15 0735  NA 144 142  K 4.5 4.4  CL 113* 110  CO2 23 22  GLUCOSE 193* 97  BUN 29* 30*  CREATININE 2.00* 2.09*  CALCIUM 8.6* 8.9   Liver Function Tests: No results for input(s): AST, ALT, ALKPHOS, BILITOT, PROT, ALBUMIN in the last 72 hours. No results for input(s): LIPASE, AMYLASE in the last 72 hours. CBC:  Recent Labs  04/14/15 0735  WBC 6.5  HGB 13.1  HCT 42.3  MCV 89.8  PLT 193   Cardiac Enzymes: No results for input(s): CKTOTAL, CKMB, CKMBINDEX, TROPONINI in the last 72 hours. BNP: Invalid input(s): POCBNP D-Dimer: No results for input(s): DDIMER in the last 72 hours. Hemoglobin A1C: No results for input(s): HGBA1C in the last 72 hours. Fasting Lipid Panel: No results for input(s): CHOL, HDL, LDLCALC, TRIG, CHOLHDL, LDLDIRECT in the last 72  hours. Thyroid Function Tests: No results for input(s): TSH, T4TOTAL, T3FREE, THYROIDAB in the last 72 hours.  Invalid input(s): FREET3 Anemia Panel: No results for input(s): VITAMINB12, FOLATE, FERRITIN, TIBC, IRON, RETICCTPCT in the last 72 hours. Coag Panel:   Lab Results  Component Value Date   INR 0.99 12/09/2013   INR 0.99 09/06/2011    RADIOLOGY: Dg Chest 2 View  04/10/2015  CLINICAL DATA:  Syncopal episode arrest from 1 hour ago. History of hypertension and diabetes. EXAM: CHEST  2 VIEW COMPARISON:  08/18/2013. FINDINGS: Mild enlargement of the cardiopericardial silhouette. No mediastinal or hilar masses or convincing adenopathy. Left anterior chest wall AICD is stable and well positioned. Clear lungs.  No pleural effusion or pneumothorax. Bony thorax is demineralized but grossly intact. IMPRESSION: No acute cardiopulmonary disease. Electronically Signed   By: Lajean Manes M.D.   On: 04/10/2015 16:58   Ct Head Wo Contrast  04/10/2015  CLINICAL DATA:  Had syncopal episode at [employer], struck back of head when he hit the floor, laceration, history hypertension, coronary artery disease post MI, ischemic cardiomyopathy, type II diabetes mellitus, stroke EXAM: CT HEAD WITHOUT CONTRAST TECHNIQUE: Contiguous axial images were obtained from the base of the skull through the vertex without intravenous contrast.  COMPARISON:  09/06/2011 FINDINGS: Generalized atrophy. Stable ventricular morphology. No midline shift or mass effect. Small vessel chronic ischemic changes of deep cerebral white matter. No intracranial hemorrhage, mass lesion, or evidence acute infarction. No extra-axial fluid collections. Visualized paranasal sinuses and mastoid air cells clear. Calvaria intact. IMPRESSION: No acute intracranial abnormalities. Electronically Signed   By: Lavonia Dana M.D.   On: 04/10/2015 17:06    ASSESSMENT AND PLAN  1. Syncope - seen by EP  and felt vasodepressor syncope.  - ICD check shows  normal function without detected arrhythmias, no therapies.  - Preliminary result of carotid doppler - Bilateral: 1-39% ICA stenosis. Vertebral artery flow is antegrade.   - No driving for 6 months.   2. ICM with an ICD - No arrhthymias - Echo 12/19 showed LV ef of 15-20%w with diffuse hypokinesis septal and apical akinesis. LV cavity size was severely dilated. Mild MR. Moderate dilated LA. Moderate TR. - BNP of 2568. However CXr clear.  -  ACEI, aldactone stopped due to elevated creatinine.  1-2+ BL LE edema. No on Lasix 40mg  daily PO. - Continue asa 81mg , imdur 30mg , bystolic 10mg  bid and creastor 40mg . Added Hydralazine and tolerating well.   3. HTN - stable and well controlled  4. HL - continue statin  5. Obstructive sleep apnea  6.  Type 2 diabetes mellitus with diabetic nephropathy (HCC)  7.  UTI (lower urinary tract infection)  8.  Mildly elevated troponin with flat trend in setting of acute CHF and CKD with worsening renal function.  No further cardiac workup.    No new recs at this time.  Will need early followup with cardiology at discharge. Will sign off.  Call with any questions.     Sueanne Margarita, MD  04/14/2015  11:30 AM

## 2015-04-14 NOTE — Discharge Instructions (Signed)
° °  It is important that you read following instructions as well as go over your medication list with RN to help you understand your care after this hospitalization.  Discharge Instructions: 1. DO NOT DRIVE UNTIL SEEN BY CARDIOLOGY FOR AT LEAST 6 MONTHS.  Diet recommendation: HEART HEALTHY  Activity: The patient is advised to gradually reintroduce usual activities. DO NOT DRIVE  Please request your primary care physician to go over all Hospital Tests and Procedure/Radiological results at the follow up,  Please get all Hospital records sent to your PCP by signing hospital release before you go home.    Do not drive, operating heavy machinery, perform activities at heights, swimming or participation in water activities or provide baby sitting services if your were admitted for syncope; until you have been seen by Primary Care Physician or a Neurologist and advised to do so again.  Do not take more than prescribed Pain, Sleep and Anxiety Medications.  You were cared for by a hospitalist during your hospital stay. If you have any questions about your discharge medications or the care you received while you were in the hospital after you are discharged, you can call the unit and ask to speak with the hospitalist on call if the hospitalist that took care of you is not available.   Once you are discharged, your primary care physician will handle any further medical issues.  Please note that NO REFILLS for any discharge medications will be authorized once you are discharged, as it is imperative that you return to your primary care physician (or establish a relationship with a primary care physician if you do not have one) for your aftercare needs so that they can reassess your need for medications and monitor your lab values.  You Must read complete instructions/literature along with all the possible adverse reactions/side effects for all the Medicines you take and that have been prescribed to you.  Take any new Medicines after you have completely understood and accept all the possible adverse reactions/side effects.  Wear Seat belts while driving.

## 2015-04-14 NOTE — Progress Notes (Signed)
[name] [name] to be D/C'd Home per MD order.  Discussed prescriptions and follow up appointments with the patient. Prescriptions given to patient, medication list explained in detail. Pt verbalized understanding.    Medication List    STOP taking these medications        benazepril 5 MG tablet  Commonly known as:  LOTENSIN     lisinopril 5 MG tablet  Commonly known as:  PRINIVIL,ZESTRIL     spironolactone 25 MG tablet  Commonly known as:  ALDACTONE      TAKE these medications        aspirin EC 81 MG tablet  Take 81 mg by mouth daily.     cephALEXin 500 MG capsule  Commonly known as:  KEFLEX  Take 1 capsule (500 mg total) by mouth 2 (two) times daily. X 6 days     furosemide 40 MG tablet  Commonly known as:  LASIX  Take 1 tablet (40 mg total) by mouth daily.     hydrALAZINE 10 MG tablet  Commonly known as:  APRESOLINE  Take 1 tablet (10 mg total) by mouth every 8 (eight) hours.     insulin lispro 100 UNIT/ML injection  Commonly known as:  HUMALOG  Use 5-15 units 3 times daily before meals     isosorbide mononitrate 30 MG 24 hr tablet  Commonly known as:  IMDUR  Take 1 tablet (30 mg total) by mouth daily.     nebivolol 10 MG tablet  Commonly known as:  BYSTOLIC  Take 1 tablet (10 mg total) by mouth 2 (two) times daily.     pantoprazole 40 MG tablet  Commonly known as:  PROTONIX  Take 40 mg by mouth daily.     rosuvastatin 40 MG tablet  Commonly known as:  CRESTOR  Take 1 tablet (40 mg total) by mouth daily.        Filed Vitals:   04/14/15 0555 04/14/15 0845  BP: 119/84 128/90  Pulse: 72 75  Temp: 98 F (36.7 C) 97.8 F (36.6 C)  Resp: 18 17    Skin clean, dry and intact without evidence of skin break down, no evidence of skin tears noted. IV catheter discontinued intact. Site without signs and symptoms of complications. Dressing and pressure applied. Pt denies pain at this time. No complaints noted.  An After Visit Summary was printed and given to  the patient. Patient escorted via [place], and D/C home via private auto.  [name] BSN, RN

## 2015-04-14 NOTE — Progress Notes (Signed)
04/14/15 Blood sugars within normal range.  HgbA1c-9.7.  Spoke with pt regarding elevated A1c.  Pt states that he is taking his home DM meds as prescribed.  He has attended outpatient DM ed classes.  I encouraged him to follow up with his PCP soon after discharge as there may need to be a medication adjustment.    [place], CDE. M.Ed. Pager [phone] Inpatient Diabetes Coordinator

## 2015-04-17 NOTE — Discharge Summary (Signed)
[facility] Discharge Summary   Patient: [name] [name]    ZOX:[id number] PCP: Thomos Lemons, DO    DOB: 04-17-1946 Date of admission: 04/10/2015  Date of discharge: 04/14/2015   Discharge Diagnoses:  Principal Problem:   Syncope Active Problems:   HYPERLIPIDEMIA   Obstructive sleep apnea   Essential hypertension   Type 2 diabetes mellitus with diabetic nephropathy (HCC)   UTI (lower urinary tract infection)   Acute on chronic systolic heart failure (HCC)   Recommendations for Outpatient Follow-up:  1. Do not drive until seen by cardiology for at least 6 months  2. Follow-up with cardiology as well as PCP as scheduled  Diet recommendation: Heart healthy and carb modified diet  Activity: The patient is advised to gradually reintroduce usual activities. Do not drive  Discharge Condition: good  History of present illness: As per the H and P dictated on admission, "[name] [name] is a 69 y.o. male with a past medical history of coronary atherosclerosis, CHF with an EF 15-20 % in 11/2013, CVA, hypertension, type 2 diabetes with peripheral neuropathy, CKD, hyperlipidemia who comes to the emergency department after having a fall when standing in line at [facility] and sustaining a head laceration in the back of his head. He denies having any symptoms prior to the fall or after the fall. No chest pain, palpitations, dizziness, diaphoresis, dyspnea or nausea. He states that he only remembers being in line, then waking up on the floor, seeing his family and paramedics.   Until just a few days ago, the patient had increased lower extremity edema for about 2 weeks, he was on an increased dose of furosemide for a week, as suggested by his cardiologist. He states that he was having dyspnea on exertion, also had orthopnea and a couple episodes of PND during that time, but feels better now. He denies having chest pain, palpitations, dizziness, diaphoresis, nausea or emesis during this time as  well.   When seen in the emergency department, he was in no acute distress. "  Hospital Course:  Summary of his active problems in the hospital is as following. 1. Syncope most likely vasovagal  cardiology consulted appreciated input. Lasix dose will be reduced on discharge as per cardiology recommendation. ICD without any arrhythmia or therapy. carotid Doppler unremarkable. Cardiology recommend no driving for 6 months Patient was ambulating without any symptoms telemetry was also not showing any events.  2. Ischemic cardiopathy with ICD. acute on chronic systolic CHF. cardiology consulted and will follow recommendation. His ejection fraction 15-20% diffuse hypokinesis. Renal function initially worsened but later on remained stable.  Continue Imdur, continue bystolic, Started on hydralazine per cardiology. also need reduced dose of Lasix. continue TED stockings.  3. Lab Error Sodium level elevated, Recheck within normal limits Most likely error  4. UTI. Streptococcus group D sensitive to penicillin UA on admission showed do numerous to count WBC. On Keflex to complete the course  All other chronic medical condition were stable during the hospitalization.  Patient was also seen by physical therapy, who recommended home health which was arranged by Child psychotherapist and case Production designer, theatre/television/film. On the day of the discharge the patient's vitals remained stable, renal function stable as well as edema, and no other acute medical condition were reported by patient. the patient was felt safe to be discharge at home with home health.  Procedures and Results:  Carotid duplex bilateral 1-39% stenosis in ICA  Echocardiogram ejection fraction 15%, moderate tricuspid regurgitation  Consultations:  Cardiology  Discharge Exam: Filed Weights   04/11/15 2053 04/12/15 2034 04/13/15 2156  Weight: 89.7 kg (197 lb 12 oz) 90.1 kg (198 lb 10.2 oz) 90 kg (198 lb 6.6 oz)   Filed Vitals:    04/14/15 0555 04/14/15 0845  BP: 119/84 128/90  Pulse: 72 75  Temp: 98 F (36.7 C) 97.8 F (36.6 C)  Resp: 18 17   General: Appear in n distress, no Rash; Oral Mucosa moist. Cardiovascular: S1 and S2 Present, aortic systolic Murmur, no JVD Respiratory: Bilateral Air entry present and Clear to Auscultation, no Crackles, no wheezes Abdomen: Bowel Sound present, Soft and no tenderness Extremities: Bilateral Pedal edema, no calf tenderness Neurology: Grossly no focal neuro deficit.  DISCHARGE MEDICATION: Discharge Instructions    (HEART FAILURE PATIENTS) Call MD:  Anytime you have any of the following symptoms: 1) 3 pound weight gain in 24 hours or 5 pounds in 1 week 2) shortness of breath, with or without a dry hacking cough 3) swelling in the hands, feet or stomach 4) if you have to sleep on extra pillows at night in order to breathe.    Complete by:  As directed      Diet - low sodium heart healthy    Complete by:  As directed      Diet Carb Modified    Complete by:  As directed      Diet Carb Modified    Complete by:  As directed      Discharge instructions    Complete by:  As directed   Please STOP taking benazepril     Discharge instructions    Complete by:  As directed   For Heart failure patients -  Check your Weight same time everyday If you gain over 2 pounds, or you develop in leg swelling, experience more shortness of breath or chest pain, call your Primary MD immediately.  Avoid adding extra salt in the food, food high in salt and Follow 1.5 lit/day fluid restriction.     Heart Failure patients record your daily weight using the same scale at the same time of day    Complete by:  As directed      Increase activity slowly    Complete by:  As directed      Increase activity slowly    Complete by:  As directed           Discharge Medication List as of 04/14/2015 12:05 PM    START taking these medications   Details  cephALEXin (KEFLEX) 500 MG capsule Take 1 capsule  (500 mg total) by mouth 2 (two) times daily. X 6 days, Starting 04/12/2015, Until Discontinued, Print    hydrALAZINE (APRESOLINE) 10 MG tablet Take 1 tablet (10 mg total) by mouth every 8 (eight) hours., Starting 04/14/2015, Until Discontinued, Print      CONTINUE these medications which have CHANGED   Details  furosemide (LASIX) 40 MG tablet Take 1 tablet (40 mg total) by mouth daily., Starting 04/14/2015, Until Discontinued, Print      CONTINUE these medications which have NOT CHANGED   Details  aspirin EC 81 MG tablet Take 81 mg by mouth daily., Until Discontinued, Historical Med    insulin lispro (HUMALOG) 100 UNIT/ML injection Use 5-15 units 3 times daily before meals, Normal    isosorbide mononitrate (IMDUR) 30 MG 24 hr tablet Take 1 tablet (30 mg total) by mouth daily., Starting 12/30/2013, Until Discontinued, Normal    nebivolol (BYSTOLIC) 10 MG tablet Take  1 tablet (10 mg total) by mouth 2 (two) times daily., Starting 08/18/2013, Until Discontinued, Normal    pantoprazole (PROTONIX) 40 MG tablet Take 40 mg by mouth daily., Until Discontinued, Historical Med    rosuvastatin (CRESTOR) 40 MG tablet Take 1 tablet (40 mg total) by mouth daily., Starting 01/12/2014, Until Discontinued, Normal      STOP taking these medications     benazepril (LOTENSIN) 5 MG tablet      lisinopril (PRINIVIL,ZESTRIL) 5 MG tablet      spironolactone (ALDACTONE) 25 MG tablet        No Known Allergies Follow-up Information    Follow up with Thomos Lemons, DO. Schedule an appointment as soon as possible for a visit in 10 days.   Specialty:  Internal Medicine   Why:  for hospital follow-up, obtain labs BMET, diabetes management.  APPOINTMENT:  Thursday, 04-21-15 @ 3:00pm with Dr Caryl Never; Dr Artist Pais is not in the office for now   Contact information:   [address] [place] [address] [phone]       Follow up with Rollene Rotunda, MD On 05/17/2015.   Specialty:  Cardiology   Why:  for  hospital follow-up @ 9am   Contact information:   [address] [place] [address] [phone]       The results of significant diagnostics from this hospitalization (including imaging, microbiology, ancillary and laboratory) are listed below for reference.    Significant Diagnostic Studies: Dg Chest 2 View  04/10/2015  CLINICAL DATA:  Syncopal episode arrest from 1 hour ago. History of hypertension and diabetes. EXAM: CHEST  2 VIEW COMPARISON:  08/18/2013. FINDINGS: Mild enlargement of the cardiopericardial silhouette. No mediastinal or hilar masses or convincing adenopathy. Left anterior chest wall AICD is stable and well positioned. Clear lungs.  No pleural effusion or pneumothorax. Bony thorax is demineralized but grossly intact. IMPRESSION: No acute cardiopulmonary disease. Electronically Signed   By: Amie Portland M.D.   On: 04/10/2015 16:58   Ct Head Wo Contrast  04/10/2015  CLINICAL DATA:  Had syncopal episode at [employer], struck back of head when he hit the floor, laceration, history hypertension, coronary artery disease post MI, ischemic cardiomyopathy, type II diabetes mellitus, stroke EXAM: CT HEAD WITHOUT CONTRAST TECHNIQUE: Contiguous axial images were obtained from the base of the skull through the vertex without intravenous contrast. COMPARISON:  09/06/2011 FINDINGS: Generalized atrophy. Stable ventricular morphology. No midline shift or mass effect. Small vessel chronic ischemic changes of deep cerebral white matter. No intracranial hemorrhage, mass lesion, or evidence acute infarction. No extra-axial fluid collections. Visualized paranasal sinuses and mastoid air cells clear. Calvaria intact. IMPRESSION: No acute intracranial abnormalities. Electronically Signed   By: Ulyses Southward M.D.   On: 04/10/2015 17:06    Microbiology: Recent Results (from the past 240 hour(s))  Urine culture     Status: None   Collection Time: 04/10/15  5:35 PM  Result Value Ref Range  Status   Specimen Description URINE, RANDOM  Final   Special Requests ADDED [id number]  Final   Culture   Final    >=100,000 COLONIES/mL GROUP B STREP(S.AGALACTIAE)ISOLATED TESTING AGAINST S. AGALACTIAE NOT ROUTINELY PERFORMED DUE TO PREDICTABILITY OF AMP/PEN/VAN SUSCEPTIBILITY. >=100,000 COLONIES/mL STREPTOCOCCUS GROUP D;high probability for S.bovis    Report Status 04/14/2015 FINAL  Final   Organism ID, Bacteria STREPTOCOCCUS GROUP D;high probability for S.bovis  Final      Susceptibility   Streptococcus group d;high probability for s.bovis - MIC*  TETRACYCLINE Value in next row       0.5SENSITIVE    VANCOMYCIN Value in next row       <=0.12SENSITIVE    CLINDAMYCIN Value in next row       <=0.25SENSITIVE    PENICILLIN Value in next row       <=0.06SENSITIVE    * >=100,000 COLONIES/mL STREPTOCOCCUS GROUP D;high probability for S.bovis     Labs: CBC:  Recent Labs Lab 04/10/15 1613 04/14/15 0735  WBC 5.0 6.5  NEUTROABS 4.0  --   HGB 12.6* 13.1  HCT 41.0 42.3  MCV 91.1 89.8  PLT 161 193   Basic Metabolic Panel:  Recent Labs Lab 04/10/15 2202 04/11/15 0400 04/12/15 0744 04/13/15 0815 04/13/15 1210 04/14/15 0735  NA  --  141 142 146* 144 142  K  --  4.1 4.1 4.5 4.5 4.4  CL  --  111 109 113* 113* 110  CO2  --  23 21* 21* 23 22  GLUCOSE  --  139* 148* 131* 193* 97  BUN  --  19 21* 28* 29* 30*  CREATININE  --  1.90* 2.13* 2.08* 2.00* 2.09*  CALCIUM  --  8.7* 8.9 8.8* 8.6* 8.9  MG 1.9  --   --   --   --   --    Liver Function Tests: No results for input(s): AST, ALT, ALKPHOS, BILITOT, PROT, ALBUMIN in the last 168 hours. No results for input(s): LIPASE, AMYLASE in the last 168 hours. No results for input(s): AMMONIA in the last 168 hours.  Cardiac Enzymes:  Recent Labs Lab 04/10/15 1613 04/10/15 2202 04/11/15 0400  TROPONINI 0.07* 0.08* 0.09*   BNP (last 3 results)  Recent Labs  04/10/15 1613 04/11/15 0400  BNP 2401.8* 2568.2*    ProBNP (last  3 results) No results for input(s): PROBNP in the last 8760 hours.  CBG:  Recent Labs Lab 04/13/15 1327 04/13/15 1648 04/13/15 2155 04/14/15 0751 04/14/15 1201  GLUCAP 165* 154* 103* 81 169*    Time spent: 30 minutes  Signed:  Loretta Kluender  [facility] 04/14/2015, 7:36 AM

## 2015-04-19 DIAGNOSIS — R55 Syncope and collapse: Secondary | ICD-10-CM | POA: Diagnosis not present

## 2015-04-19 DIAGNOSIS — I129 Hypertensive chronic kidney disease with stage 1 through stage 4 chronic kidney disease, or unspecified chronic kidney disease: Secondary | ICD-10-CM | POA: Diagnosis not present

## 2015-04-19 DIAGNOSIS — E785 Hyperlipidemia, unspecified: Secondary | ICD-10-CM | POA: Diagnosis not present

## 2015-04-19 DIAGNOSIS — I5042 Chronic combined systolic (congestive) and diastolic (congestive) heart failure: Secondary | ICD-10-CM | POA: Diagnosis not present

## 2015-04-19 DIAGNOSIS — N390 Urinary tract infection, site not specified: Secondary | ICD-10-CM | POA: Diagnosis not present

## 2015-04-19 DIAGNOSIS — I2510 Atherosclerotic heart disease of native coronary artery without angina pectoris: Secondary | ICD-10-CM | POA: Diagnosis not present

## 2015-04-19 DIAGNOSIS — I255 Ischemic cardiomyopathy: Secondary | ICD-10-CM | POA: Diagnosis not present

## 2015-04-19 DIAGNOSIS — N189 Chronic kidney disease, unspecified: Secondary | ICD-10-CM | POA: Diagnosis not present

## 2015-04-19 DIAGNOSIS — E1142 Type 2 diabetes mellitus with diabetic polyneuropathy: Secondary | ICD-10-CM | POA: Diagnosis not present

## 2015-04-21 ENCOUNTER — Ambulatory Visit (INDEPENDENT_AMBULATORY_CARE_PROVIDER_SITE_OTHER): Payer: Commercial Managed Care - HMO | Admitting: Family Medicine

## 2015-04-21 DIAGNOSIS — I5042 Chronic combined systolic (congestive) and diastolic (congestive) heart failure: Secondary | ICD-10-CM

## 2015-04-21 DIAGNOSIS — I10 Essential (primary) hypertension: Secondary | ICD-10-CM

## 2015-04-21 DIAGNOSIS — R55 Syncope and collapse: Secondary | ICD-10-CM | POA: Diagnosis not present

## 2015-04-21 DIAGNOSIS — I5023 Acute on chronic systolic (congestive) heart failure: Secondary | ICD-10-CM

## 2015-04-21 DIAGNOSIS — N189 Chronic kidney disease, unspecified: Secondary | ICD-10-CM

## 2015-04-21 DIAGNOSIS — E1121 Type 2 diabetes mellitus with diabetic nephropathy: Secondary | ICD-10-CM | POA: Diagnosis not present

## 2015-04-21 DIAGNOSIS — Z87440 Personal history of urinary (tract) infections: Secondary | ICD-10-CM

## 2015-04-21 LAB — BASIC METABOLIC PANEL
BUN: 21 mg/dL (ref 6–23)
CALCIUM: 9.1 mg/dL (ref 8.4–10.5)
CHLORIDE: 106 meq/L (ref 96–112)
CO2: 26 meq/L (ref 19–32)
Creatinine, Ser: 1.64 mg/dL — ABNORMAL HIGH (ref 0.40–1.50)
GFR: 44.52 mL/min — ABNORMAL LOW (ref >60.00)
Glucose, Bld: 257 mg/dL — ABNORMAL HIGH (ref 70–99)
POTASSIUM: 4.2 meq/L (ref 3.5–5.1)
SODIUM: 141 meq/L (ref 135–145)

## 2015-04-21 NOTE — Patient Instructions (Signed)
Avoid driving for 6 months. Continue daily weight and be in touch for increase of 2 pounds in one day or 3 pounds in one week.   Elevate legs frequently
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
## 2015-04-22 DIAGNOSIS — N189 Chronic kidney disease, unspecified: Secondary | ICD-10-CM | POA: Diagnosis not present

## 2015-04-22 DIAGNOSIS — I2510 Atherosclerotic heart disease of native coronary artery without angina pectoris: Secondary | ICD-10-CM | POA: Diagnosis not present

## 2015-04-22 DIAGNOSIS — I129 Hypertensive chronic kidney disease with stage 1 through stage 4 chronic kidney disease, or unspecified chronic kidney disease: Secondary | ICD-10-CM | POA: Diagnosis not present

## 2015-04-22 DIAGNOSIS — I255 Ischemic cardiomyopathy: Secondary | ICD-10-CM | POA: Diagnosis not present

## 2015-04-22 DIAGNOSIS — I5042 Chronic combined systolic (congestive) and diastolic (congestive) heart failure: Secondary | ICD-10-CM | POA: Diagnosis not present

## 2015-04-22 DIAGNOSIS — E1142 Type 2 diabetes mellitus with diabetic polyneuropathy: Secondary | ICD-10-CM | POA: Diagnosis not present

## 2015-04-22 DIAGNOSIS — N390 Urinary tract infection, site not specified: Secondary | ICD-10-CM | POA: Diagnosis not present

## 2015-04-22 DIAGNOSIS — R55 Syncope and collapse: Secondary | ICD-10-CM | POA: Diagnosis not present

## 2015-04-22 DIAGNOSIS — E785 Hyperlipidemia, unspecified: Secondary | ICD-10-CM | POA: Diagnosis not present

## 2015-04-22 NOTE — Addendum Note (Signed)
Addended by: Elio Forget on: 04/22/2015 02:49 PM   Modules accepted: Orders

## 2015-04-25 DIAGNOSIS — N189 Chronic kidney disease, unspecified: Secondary | ICD-10-CM | POA: Diagnosis not present

## 2015-04-25 DIAGNOSIS — I2510 Atherosclerotic heart disease of native coronary artery without angina pectoris: Secondary | ICD-10-CM | POA: Diagnosis not present

## 2015-04-25 DIAGNOSIS — I5042 Chronic combined systolic (congestive) and diastolic (congestive) heart failure: Secondary | ICD-10-CM | POA: Diagnosis not present

## 2015-04-25 DIAGNOSIS — N390 Urinary tract infection, site not specified: Secondary | ICD-10-CM | POA: Diagnosis not present

## 2015-04-25 DIAGNOSIS — R55 Syncope and collapse: Secondary | ICD-10-CM | POA: Diagnosis not present

## 2015-04-25 DIAGNOSIS — I255 Ischemic cardiomyopathy: Secondary | ICD-10-CM | POA: Diagnosis not present

## 2015-04-25 DIAGNOSIS — E1142 Type 2 diabetes mellitus with diabetic polyneuropathy: Secondary | ICD-10-CM | POA: Diagnosis not present

## 2015-04-25 DIAGNOSIS — E785 Hyperlipidemia, unspecified: Secondary | ICD-10-CM | POA: Diagnosis not present

## 2015-04-25 DIAGNOSIS — I129 Hypertensive chronic kidney disease with stage 1 through stage 4 chronic kidney disease, or unspecified chronic kidney disease: Secondary | ICD-10-CM | POA: Diagnosis not present

## 2015-04-26 DIAGNOSIS — I2510 Atherosclerotic heart disease of native coronary artery without angina pectoris: Secondary | ICD-10-CM | POA: Diagnosis not present

## 2015-04-26 DIAGNOSIS — I5042 Chronic combined systolic (congestive) and diastolic (congestive) heart failure: Secondary | ICD-10-CM | POA: Diagnosis not present

## 2015-04-26 DIAGNOSIS — N390 Urinary tract infection, site not specified: Secondary | ICD-10-CM | POA: Diagnosis not present

## 2015-04-26 DIAGNOSIS — R55 Syncope and collapse: Secondary | ICD-10-CM | POA: Diagnosis not present

## 2015-04-26 DIAGNOSIS — I129 Hypertensive chronic kidney disease with stage 1 through stage 4 chronic kidney disease, or unspecified chronic kidney disease: Secondary | ICD-10-CM | POA: Diagnosis not present

## 2015-04-26 DIAGNOSIS — E1142 Type 2 diabetes mellitus with diabetic polyneuropathy: Secondary | ICD-10-CM | POA: Diagnosis not present

## 2015-04-26 DIAGNOSIS — I255 Ischemic cardiomyopathy: Secondary | ICD-10-CM | POA: Diagnosis not present

## 2015-04-26 DIAGNOSIS — E785 Hyperlipidemia, unspecified: Secondary | ICD-10-CM | POA: Diagnosis not present

## 2015-04-26 DIAGNOSIS — N189 Chronic kidney disease, unspecified: Secondary | ICD-10-CM | POA: Diagnosis not present

## 2015-04-27 DIAGNOSIS — N390 Urinary tract infection, site not specified: Secondary | ICD-10-CM | POA: Diagnosis not present

## 2015-04-27 DIAGNOSIS — I255 Ischemic cardiomyopathy: Secondary | ICD-10-CM | POA: Diagnosis not present

## 2015-04-27 DIAGNOSIS — I2510 Atherosclerotic heart disease of native coronary artery without angina pectoris: Secondary | ICD-10-CM | POA: Diagnosis not present

## 2015-04-27 DIAGNOSIS — I5042 Chronic combined systolic (congestive) and diastolic (congestive) heart failure: Secondary | ICD-10-CM | POA: Diagnosis not present

## 2015-04-27 DIAGNOSIS — E785 Hyperlipidemia, unspecified: Secondary | ICD-10-CM | POA: Diagnosis not present

## 2015-04-27 DIAGNOSIS — N189 Chronic kidney disease, unspecified: Secondary | ICD-10-CM | POA: Diagnosis not present

## 2015-04-27 DIAGNOSIS — R55 Syncope and collapse: Secondary | ICD-10-CM | POA: Diagnosis not present

## 2015-04-27 DIAGNOSIS — E1142 Type 2 diabetes mellitus with diabetic polyneuropathy: Secondary | ICD-10-CM | POA: Diagnosis not present

## 2015-04-27 DIAGNOSIS — I129 Hypertensive chronic kidney disease with stage 1 through stage 4 chronic kidney disease, or unspecified chronic kidney disease: Secondary | ICD-10-CM | POA: Diagnosis not present

## 2015-04-29 ENCOUNTER — Ambulatory Visit (INDEPENDENT_AMBULATORY_CARE_PROVIDER_SITE_OTHER): Payer: Commercial Managed Care - HMO | Admitting: Family Medicine

## 2015-04-29 ENCOUNTER — Encounter: Payer: Self-pay | Admitting: Family Medicine

## 2015-04-29 VITALS — BP 120/88 | HR 108 | Temp 97.3°F | Ht 74.0 in | Wt 197.0 lb

## 2015-04-29 DIAGNOSIS — Z794 Long term (current) use of insulin: Secondary | ICD-10-CM

## 2015-04-29 DIAGNOSIS — L97509 Non-pressure chronic ulcer of other part of unspecified foot with unspecified severity: Secondary | ICD-10-CM

## 2015-04-29 DIAGNOSIS — E11621 Type 2 diabetes mellitus with foot ulcer: Secondary | ICD-10-CM | POA: Diagnosis not present

## 2015-04-29 DIAGNOSIS — I5042 Chronic combined systolic (congestive) and diastolic (congestive) heart failure: Secondary | ICD-10-CM | POA: Diagnosis not present

## 2015-04-29 LAB — BASIC METABOLIC PANEL
BUN: 20 mg/dL (ref 6–23)
CHLORIDE: 106 meq/L (ref 96–112)
CO2: 29 meq/L (ref 19–32)
Calcium: 9.2 mg/dL (ref 8.4–10.5)
Creatinine, Ser: 1.81 mg/dL — ABNORMAL HIGH (ref 0.40–1.50)
GFR: 39.73 mL/min — ABNORMAL LOW (ref >60.00)
GLUCOSE: 266 mg/dL — AB (ref 70–99)
POTASSIUM: 4.2 meq/L (ref 3.5–5.1)
Sodium: 143 mEq/L (ref 135–145)
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
## 2015-04-29 NOTE — Patient Instructions (Signed)
Diabetes and Foot Care Diabetes may cause you to have problems because of poor blood supply (circulation) to your feet and legs. This may cause the skin on your feet to become thinner, break easier, and heal more slowly. Your skin may become dry, and the skin may peel and crack. You may also have nerve damage in your legs and feet causing decreased feeling in them. You may not notice minor injuries to your feet that could lead to infections or more serious problems. Taking care of your feet is one of the most important things you can do for yourself.  HOME CARE INSTRUCTIONS  Wear shoes at all times, even in the house. Do not go barefoot. Bare feet are easily injured.  Check your feet daily for blisters, cuts, and redness. If you cannot see the bottom of your feet, use a mirror or ask someone for help.  Wash your feet with warm water (do not use hot water) and mild soap. Then pat your feet and the areas between your toes until they are completely dry. Do not soak your feet as this can dry your skin.  Apply a moisturizing lotion or petroleum jelly (that does not contain alcohol and is unscented) to the skin on your feet and to dry, brittle toenails. Do not apply lotion between your toes.  Trim your toenails straight across. Do not dig under them or around the cuticle. File the edges of your nails with an emery board or nail file.  Do not cut corns or calluses or try to remove them with medicine.  Wear clean socks or stockings every day. Make sure they are not too tight. Do not wear knee-high stockings since they may decrease blood flow to your legs.  Wear shoes that fit properly and have enough cushioning. To break in new shoes, wear them for just a few hours a day. This prevents you from injuring your feet. Always look in your shoes before you put them on to be sure there are no objects inside.  Do not cross your legs. This may decrease the blood flow to your feet.  If you find a minor scrape,  cut, or break in the skin on your feet, keep it and the skin around it clean and dry. These areas may be cleansed with mild soap and water. Do not cleanse the area with peroxide, alcohol, or iodine.  When you remove an adhesive bandage, be sure not to damage the skin around it.  If you have a wound, look at it several times a day to make sure it is healing.  Do not use heating pads or hot water bottles. They may burn your skin. If you have lost feeling in your feet or legs, you may not know it is happening until it is too late.  Make sure your health care provider performs a complete foot exam at least annually or more often if you have foot problems. Report any cuts, sores, or bruises to your health care provider immediately. SEEK MEDICAL CARE IF:   You have an injury that is not healing.  You have cuts or breaks in the skin.  You have an ingrown nail.  You notice redness on your legs or feet.  You feel burning or tingling in your legs or feet.  You have pain or cramps in your legs and feet.  Your legs or feet are numb.  Your feet always feel cold. SEEK IMMEDIATE MEDICAL CARE IF:   There is increasing redness,   swelling, or pain in or around a wound.  There is a red line that goes up your leg.  Pus is coming from a wound.  You develop a fever or as directed by your health care provider.  You notice a bad smell coming from an ulcer or wound.   This information is not intended to replace advice given to you by your health care provider. Make sure you discuss any questions you have with your health care provider.   Document Released: 04/06/2000 Document Revised: 12/10/2012 Document Reviewed: 09/16/2012 Elsevier Interactive Patient Education 2016 [employer]  

## 2015-04-29 NOTE — Progress Notes (Signed)
Pre visit review using our clinic review tool, if applicable. No additional management support is needed unless otherwise documented below in the visit note. 

## 2015-05-03 NOTE — Progress Notes (Signed)
Physical Therapy Note  These are the G-codes associated with PT eval completed on 22-Apr-2023.    04-22-2015 1330  PT G-Codes **NOT FOR INPATIENT CLASS**  Functional Assessment Tool Used clinical jdugement  Functional Limitation Mobility: Walking and moving around  Mobility: Walking and Moving Around Current Status [id number]) CI  Mobility: Walking and Moving Around Goal Status [id number]) CI   Kittie Plater, PT, DPT Pager #: [phone] Office #: [phone]

## 2015-05-04 DIAGNOSIS — I255 Ischemic cardiomyopathy: Secondary | ICD-10-CM | POA: Diagnosis not present

## 2015-05-04 DIAGNOSIS — E785 Hyperlipidemia, unspecified: Secondary | ICD-10-CM | POA: Diagnosis not present

## 2015-05-04 DIAGNOSIS — E1142 Type 2 diabetes mellitus with diabetic polyneuropathy: Secondary | ICD-10-CM | POA: Diagnosis not present

## 2015-05-04 DIAGNOSIS — R55 Syncope and collapse: Secondary | ICD-10-CM | POA: Diagnosis not present

## 2015-05-04 DIAGNOSIS — N390 Urinary tract infection, site not specified: Secondary | ICD-10-CM | POA: Diagnosis not present

## 2015-05-04 DIAGNOSIS — N189 Chronic kidney disease, unspecified: Secondary | ICD-10-CM | POA: Diagnosis not present

## 2015-05-04 DIAGNOSIS — I129 Hypertensive chronic kidney disease with stage 1 through stage 4 chronic kidney disease, or unspecified chronic kidney disease: Secondary | ICD-10-CM | POA: Diagnosis not present

## 2015-05-04 DIAGNOSIS — I2510 Atherosclerotic heart disease of native coronary artery without angina pectoris: Secondary | ICD-10-CM | POA: Diagnosis not present

## 2015-05-04 DIAGNOSIS — I5042 Chronic combined systolic (congestive) and diastolic (congestive) heart failure: Secondary | ICD-10-CM | POA: Diagnosis not present

## 2015-05-06 DIAGNOSIS — R55 Syncope and collapse: Secondary | ICD-10-CM | POA: Diagnosis not present

## 2015-05-06 DIAGNOSIS — I129 Hypertensive chronic kidney disease with stage 1 through stage 4 chronic kidney disease, or unspecified chronic kidney disease: Secondary | ICD-10-CM | POA: Diagnosis not present

## 2015-05-06 DIAGNOSIS — N189 Chronic kidney disease, unspecified: Secondary | ICD-10-CM | POA: Diagnosis not present

## 2015-05-06 DIAGNOSIS — E1142 Type 2 diabetes mellitus with diabetic polyneuropathy: Secondary | ICD-10-CM | POA: Diagnosis not present

## 2015-05-06 DIAGNOSIS — E785 Hyperlipidemia, unspecified: Secondary | ICD-10-CM | POA: Diagnosis not present

## 2015-05-06 DIAGNOSIS — I5042 Chronic combined systolic (congestive) and diastolic (congestive) heart failure: Secondary | ICD-10-CM | POA: Diagnosis not present

## 2015-05-06 DIAGNOSIS — N390 Urinary tract infection, site not specified: Secondary | ICD-10-CM | POA: Diagnosis not present

## 2015-05-06 DIAGNOSIS — I2510 Atherosclerotic heart disease of native coronary artery without angina pectoris: Secondary | ICD-10-CM | POA: Diagnosis not present

## 2015-05-06 DIAGNOSIS — I255 Ischemic cardiomyopathy: Secondary | ICD-10-CM | POA: Diagnosis not present

## 2015-05-10 DIAGNOSIS — I5042 Chronic combined systolic (congestive) and diastolic (congestive) heart failure: Secondary | ICD-10-CM | POA: Diagnosis not present

## 2015-05-10 DIAGNOSIS — E1142 Type 2 diabetes mellitus with diabetic polyneuropathy: Secondary | ICD-10-CM | POA: Diagnosis not present

## 2015-05-10 DIAGNOSIS — I2510 Atherosclerotic heart disease of native coronary artery without angina pectoris: Secondary | ICD-10-CM | POA: Diagnosis not present

## 2015-05-10 DIAGNOSIS — N390 Urinary tract infection, site not specified: Secondary | ICD-10-CM | POA: Diagnosis not present

## 2015-05-10 DIAGNOSIS — R55 Syncope and collapse: Secondary | ICD-10-CM | POA: Diagnosis not present

## 2015-05-10 DIAGNOSIS — I255 Ischemic cardiomyopathy: Secondary | ICD-10-CM | POA: Diagnosis not present

## 2015-05-10 DIAGNOSIS — E785 Hyperlipidemia, unspecified: Secondary | ICD-10-CM | POA: Diagnosis not present

## 2015-05-10 DIAGNOSIS — N189 Chronic kidney disease, unspecified: Secondary | ICD-10-CM | POA: Diagnosis not present

## 2015-05-10 DIAGNOSIS — I129 Hypertensive chronic kidney disease with stage 1 through stage 4 chronic kidney disease, or unspecified chronic kidney disease: Secondary | ICD-10-CM | POA: Diagnosis not present

## 2015-05-12 DIAGNOSIS — R55 Syncope and collapse: Secondary | ICD-10-CM | POA: Diagnosis not present

## 2015-05-12 DIAGNOSIS — N390 Urinary tract infection, site not specified: Secondary | ICD-10-CM | POA: Diagnosis not present

## 2015-05-12 DIAGNOSIS — I2510 Atherosclerotic heart disease of native coronary artery without angina pectoris: Secondary | ICD-10-CM | POA: Diagnosis not present

## 2015-05-12 DIAGNOSIS — N189 Chronic kidney disease, unspecified: Secondary | ICD-10-CM | POA: Diagnosis not present

## 2015-05-12 DIAGNOSIS — E1142 Type 2 diabetes mellitus with diabetic polyneuropathy: Secondary | ICD-10-CM | POA: Diagnosis not present

## 2015-05-12 DIAGNOSIS — E785 Hyperlipidemia, unspecified: Secondary | ICD-10-CM | POA: Diagnosis not present

## 2015-05-12 DIAGNOSIS — I255 Ischemic cardiomyopathy: Secondary | ICD-10-CM | POA: Diagnosis not present

## 2015-05-12 DIAGNOSIS — I5042 Chronic combined systolic (congestive) and diastolic (congestive) heart failure: Secondary | ICD-10-CM | POA: Diagnosis not present

## 2015-05-12 DIAGNOSIS — I129 Hypertensive chronic kidney disease with stage 1 through stage 4 chronic kidney disease, or unspecified chronic kidney disease: Secondary | ICD-10-CM | POA: Diagnosis not present

## 2015-05-13 ENCOUNTER — Ambulatory Visit (INDEPENDENT_AMBULATORY_CARE_PROVIDER_SITE_OTHER): Payer: Commercial Managed Care - HMO | Admitting: *Deleted

## 2015-05-13 ENCOUNTER — Telehealth: Payer: Self-pay | Admitting: Cardiology

## 2015-05-13 DIAGNOSIS — I255 Ischemic cardiomyopathy: Secondary | ICD-10-CM | POA: Diagnosis not present

## 2015-05-13 NOTE — Telephone Encounter (Signed)
Spoke w/ pt and requested that he send a manual transmission w/ his home monitor b/c it hasn't updated in at least 8 days. Pt verbalized understanding.

## 2015-05-16 DIAGNOSIS — I129 Hypertensive chronic kidney disease with stage 1 through stage 4 chronic kidney disease, or unspecified chronic kidney disease: Secondary | ICD-10-CM | POA: Diagnosis not present

## 2015-05-16 DIAGNOSIS — I2510 Atherosclerotic heart disease of native coronary artery without angina pectoris: Secondary | ICD-10-CM | POA: Diagnosis not present

## 2015-05-16 DIAGNOSIS — N189 Chronic kidney disease, unspecified: Secondary | ICD-10-CM | POA: Diagnosis not present

## 2015-05-16 DIAGNOSIS — E785 Hyperlipidemia, unspecified: Secondary | ICD-10-CM | POA: Diagnosis not present

## 2015-05-16 DIAGNOSIS — R55 Syncope and collapse: Secondary | ICD-10-CM | POA: Diagnosis not present

## 2015-05-16 DIAGNOSIS — N390 Urinary tract infection, site not specified: Secondary | ICD-10-CM | POA: Diagnosis not present

## 2015-05-16 DIAGNOSIS — I255 Ischemic cardiomyopathy: Secondary | ICD-10-CM | POA: Diagnosis not present

## 2015-05-16 DIAGNOSIS — E1142 Type 2 diabetes mellitus with diabetic polyneuropathy: Secondary | ICD-10-CM | POA: Diagnosis not present

## 2015-05-16 DIAGNOSIS — I5042 Chronic combined systolic (congestive) and diastolic (congestive) heart failure: Secondary | ICD-10-CM | POA: Diagnosis not present

## 2015-05-17 ENCOUNTER — Ambulatory Visit (INDEPENDENT_AMBULATORY_CARE_PROVIDER_SITE_OTHER): Payer: Commercial Managed Care - HMO | Admitting: Cardiology

## 2015-05-17 ENCOUNTER — Encounter: Payer: Self-pay | Admitting: Cardiology

## 2015-05-17 VITALS — BP 120/80 | HR 103 | Ht 74.0 in | Wt 200.6 lb

## 2015-05-17 DIAGNOSIS — E785 Hyperlipidemia, unspecified: Secondary | ICD-10-CM | POA: Diagnosis not present

## 2015-05-17 DIAGNOSIS — N390 Urinary tract infection, site not specified: Secondary | ICD-10-CM | POA: Diagnosis not present

## 2015-05-17 DIAGNOSIS — I5042 Chronic combined systolic (congestive) and diastolic (congestive) heart failure: Secondary | ICD-10-CM | POA: Diagnosis not present

## 2015-05-17 DIAGNOSIS — R55 Syncope and collapse: Secondary | ICD-10-CM | POA: Diagnosis not present

## 2015-05-17 DIAGNOSIS — N189 Chronic kidney disease, unspecified: Secondary | ICD-10-CM | POA: Diagnosis not present

## 2015-05-17 DIAGNOSIS — E1142 Type 2 diabetes mellitus with diabetic polyneuropathy: Secondary | ICD-10-CM | POA: Diagnosis not present

## 2015-05-17 DIAGNOSIS — I255 Ischemic cardiomyopathy: Secondary | ICD-10-CM | POA: Diagnosis not present

## 2015-05-17 DIAGNOSIS — I2510 Atherosclerotic heart disease of native coronary artery without angina pectoris: Secondary | ICD-10-CM | POA: Diagnosis not present

## 2015-05-17 DIAGNOSIS — I129 Hypertensive chronic kidney disease with stage 1 through stage 4 chronic kidney disease, or unspecified chronic kidney disease: Secondary | ICD-10-CM | POA: Diagnosis not present

## 2015-05-17 MED ORDER — FUROSEMIDE 40 MG PO TABS: 40.0000 mg | ORAL_TABLET | Freq: Two times a day (BID) | ORAL | Status: DC

## 2015-05-17 NOTE — Patient Instructions (Signed)
Dr Percival Spanish has recommended making the following medication changes: INCREASE Furosemide (Lasix) to 40 mg TWICE daily  >>A new prescription has been sent to your pharmacy electronically  Your physician recommends that you schedule a follow-up appointment with a NP or PA on [place].   PLEASE wrap your legs and keep your legs elevated.
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
## 2015-05-19 DIAGNOSIS — I2510 Atherosclerotic heart disease of native coronary artery without angina pectoris: Secondary | ICD-10-CM | POA: Diagnosis not present

## 2015-05-19 DIAGNOSIS — N390 Urinary tract infection, site not specified: Secondary | ICD-10-CM | POA: Diagnosis not present

## 2015-05-19 DIAGNOSIS — R55 Syncope and collapse: Secondary | ICD-10-CM | POA: Diagnosis not present

## 2015-05-19 DIAGNOSIS — I129 Hypertensive chronic kidney disease with stage 1 through stage 4 chronic kidney disease, or unspecified chronic kidney disease: Secondary | ICD-10-CM | POA: Diagnosis not present

## 2015-05-19 DIAGNOSIS — I5042 Chronic combined systolic (congestive) and diastolic (congestive) heart failure: Secondary | ICD-10-CM | POA: Diagnosis not present

## 2015-05-19 DIAGNOSIS — E1142 Type 2 diabetes mellitus with diabetic polyneuropathy: Secondary | ICD-10-CM | POA: Diagnosis not present

## 2015-05-19 DIAGNOSIS — I255 Ischemic cardiomyopathy: Secondary | ICD-10-CM | POA: Diagnosis not present

## 2015-05-19 DIAGNOSIS — E785 Hyperlipidemia, unspecified: Secondary | ICD-10-CM | POA: Diagnosis not present

## 2015-05-19 DIAGNOSIS — N189 Chronic kidney disease, unspecified: Secondary | ICD-10-CM | POA: Diagnosis not present

## 2015-05-20 ENCOUNTER — Encounter: Payer: Self-pay | Admitting: Internal Medicine

## 2015-05-20 ENCOUNTER — Ambulatory Visit (INDEPENDENT_AMBULATORY_CARE_PROVIDER_SITE_OTHER): Payer: Commercial Managed Care - HMO | Admitting: Physician Assistant

## 2015-05-20 ENCOUNTER — Telehealth: Payer: Self-pay | Admitting: *Deleted

## 2015-05-20 ENCOUNTER — Encounter: Payer: Self-pay | Admitting: Physician Assistant

## 2015-05-20 VITALS — BP 132/80 | HR 95 | Ht 74.0 in | Wt 194.8 lb

## 2015-05-20 DIAGNOSIS — I4901 Ventricular fibrillation: Secondary | ICD-10-CM

## 2015-05-20 DIAGNOSIS — I5023 Acute on chronic systolic (congestive) heart failure: Secondary | ICD-10-CM | POA: Diagnosis not present

## 2015-05-20 DIAGNOSIS — Z79899 Other long term (current) drug therapy: Secondary | ICD-10-CM | POA: Diagnosis not present

## 2015-05-20 DIAGNOSIS — I509 Heart failure, unspecified: Secondary | ICD-10-CM | POA: Diagnosis not present

## 2015-05-20 DIAGNOSIS — I255 Ischemic cardiomyopathy: Secondary | ICD-10-CM

## 2015-05-20 LAB — BASIC METABOLIC PANEL
BUN: 22 mg/dL (ref 7–25)
CO2: 24 mmol/L (ref 20–31)
Calcium: 8.7 mg/dL (ref 8.6–10.3)
Chloride: 106 mmol/L (ref 98–110)
Creat: 1.79 mg/dL — ABNORMAL HIGH (ref 0.70–1.25)
GLUCOSE: 272 mg/dL — AB (ref 65–99)
Potassium: 4.1 mmol/L (ref 3.5–5.3)
Sodium: 142 mmol/L (ref 135–146)

## 2015-05-20 LAB — MAGNESIUM: MAGNESIUM: 1.9 mg/dL (ref 1.5–2.5)

## 2015-05-20 NOTE — Patient Instructions (Signed)
Your physician recommends that you return for lab work TODAY.  Your physician recommends that you schedule a follow-up appointment in: 1 week with extender for heart failure.  Weigh daily Call [phone] if weight climbs more than 3 pounds in a day or 5 pounds in a week. No salt to very little salt in your diet.  No more than 2000 mg in a day. Call if increased shortness of breath or increased swelling.   YOU ARE UNABLE TO DRIVE OR OPERATE ANY VEHICLE FOR 6 MONTHS.

## 2015-05-20 NOTE — Telephone Encounter (Signed)
LMOM to call back to device clinic. Pt immediately called back. Pt made aware that he had an arrhythmia yesterday morning and received a shock which was successful. He reports that he was laying on the couch with his feet up and felt a "little funny" and he dozed off and woke up still feeling a bit funny. I made him aware about driving restrictions x 6 months and that Luisa Dago will check some bloodwork at this afternoon's appt. He verbalizes understanding and is agreeable. He is overdue to see Dr. Caryl Comes- I will have our scheduler arrange.

## 2015-05-20 NOTE — Progress Notes (Signed)
Remote ICD transmission.   

## 2015-05-20 NOTE — Progress Notes (Signed)
Patient ID: [name] [name], male   DOB: 06/27/1945, 70 y.o.   MRN: [id number]    Date:  05/20/2015   ID:  [name] [name], DOB 1945-07-10, MRN [id number]  PCP:  [name] Pry, DO  Primary Cardiologist:  [name]  Chief Complaint  Patient presents with  . Follow-up    per Dr. Hochrein//CAD//pt sttaes no Sx.     History of Present Illness: [name] [name] is a 70 y.o. male  The patient presents for followup of ICD shock for ventricular fibrillation which occurred yesterday.  He had an echocardiogram in December 2016 after admission for syncope which revealed  his ejection fraction was 15-20%. He has a history of coronary disease.   He's had an ischemic cardiomyopathy and an ICD placed.  An MRI has demonstrated nonviable myocardium in the past. He is found to have an EF of 15%. Cardiac cath for evaluation of increased dyspnea last year demonstrated demonstrated triple vessel coronary artery disease. There was consideration of high risk revascularization of the circumflex lesion. However, I sent him for a stress viability study which demonstrated no evidence of viability. December and I reviewed these extensive records. He had syncope that was ultimately felt to be vasovagal.  There were no arrhythmias on his ICD. His EF is 15%.   patient was seen by Dr. Percival Spanish on 05/17/15 and his lasix was increased to bid.  Since that his weight has decreased from 200-194 pounds.   Mr. Marano states he was lying on his couch yesterday when he dozed off. He woke up and was feeling sluggish for about 3-4 minutes.  He does not recall being shocked by his ICD.  He denies having any chest pain or shortness of breath at rest. Can walk on flat surfaces without dyspnea but as soon as there is an incline he developed shortness of breath..  The patient currently denies nausea, vomiting, fever, chest pain, dizziness, PND, cough, congestion, abdominal pain, hematochezia, melena, lower extremity edema,  claudication.  Wt Readings from Last 3 Encounters:  05/20/15 194 lb 12.8 oz (88.361 kg)  05/17/15 200 lb 9.6 oz (90.992 kg)  04/29/15 197 lb (89.359 kg)     Past Medical History  Diagnosis Date  . Hypertension   . Chronic combined systolic and diastolic heart failure ([place])   . Peripheral neuropathy ([place])   . Gout   . Hx of echocardiogram 2015    Echo (08/2013):  EF 10-15%, diff HK, mod LAE, severe RVE, mod reduced RVSF, mild RAE, PASP 41 mmHg.  . Ischemic cardiomyopathy     Echo (8/11):  EF 40-45%;  Echo (5/15):  EF 10-15%  . CAD (coronary artery disease)     a. LHC (08/2005):  Ostial LAD 99%, mid LAD 50%, superior D1 80%, inferior D1 75%, mid OM proximal 80%, proximal PDA 25-30%, mid RCA 99%.   MRI with full thickness scar.  Med Rx.  2015 demonstrated similar diffuse three-vessel coronary disease. Perfusion imaging with rest we distribution demonstrated no viability in the area of the LAD and circumflex. Medical management.  [name] [name] NSVT (nonsustained ventricular tachycardia) (HCC)     s/p ICD  . CKD (chronic kidney disease)   . Automatic implantable cardioverter-defibrillator in situ   . Myocardial infarction [facility]) 2007    out of hosp [place] - LHC with 3v CAD rx medically  . Sleep apnea     "has CPAP; won't use it"  . Stroke [name]) ~ 2013    "right leg weak since" (  12/09/2013)  . Type II diabetes mellitus ([place]) dx'd 2005  . CAD, multiple vessel, RCA 100% mid occl.; LAD 99% stenosisprox.  mid of 50-60%; LCX OM-1 90%, OM-2 99%,AV groove 80%  12/10/2013    Current Outpatient Prescriptions  Medication Sig Dispense Refill  . aspirin EC 81 MG tablet Take 81 mg by mouth daily.    . furosemide (LASIX) 40 MG tablet Take 1 tablet (40 mg total) by mouth 2 (two) times daily. 60 tablet 11  . hydrALAZINE (APRESOLINE) 10 MG tablet Take 1 tablet (10 mg total) by mouth every 8 (eight) hours. 30 tablet 0  . insulin glargine (LANTUS) 100 UNIT/ML injection Inject 75 Units into the skin at bedtime.    .  insulin lispro (HUMALOG) 100 UNIT/ML injection Use 5-15 units 3 times daily before meals 15 mL 5  . isosorbide mononitrate (IMDUR) 30 MG 24 hr tablet Take 1 tablet (30 mg total) by mouth daily. 30 tablet 6  . nebivolol (BYSTOLIC) 10 MG tablet Take 1 tablet (10 mg total) by mouth 2 (two) times daily. 60 tablet 11  . pantoprazole (PROTONIX) 40 MG tablet Take 40 mg by mouth daily.    . rosuvastatin (CRESTOR) 40 MG tablet Take 1 tablet (40 mg total) by mouth daily. 30 tablet 6   No current facility-administered medications for this visit.    Allergies:   No Known Allergies  Social History:  The patient  reports that he has never smoked. He has never used smokeless tobacco. He reports that he does not drink alcohol or use illicit drugs.   Family history:   Family History  Problem Relation Age of Onset  . COPD Mother   . Lung cancer Father     ROS:  Please see the history of present illness.  All other systems reviewed and negative.   PHYSICAL EXAM: VS:  BP 132/80 mmHg  Pulse 95  Ht 6\' 2"  (1.88 m)  Wt 194 lb 12.8 oz (88.361 kg)  BMI 25.00 kg/m2 Well nourished, well developed, in no acute distress HEENT: Pupils are equal round react to light accommodation extraocular movements are intact.  Neck: Elevated JVDNo cervical lymphadenopathy. Cardiac: Regular rate and rhythm with 2/6 sys mm. Lungs:  clear to auscultation bilaterally, no wheezing, rhonchi or rales Abd: soft, nontender, positive bowel sounds all quadrants, no hepatosplenomegaly Ext: 2+ lower extremity edema.  2+ radial and dorsalis pedis pulses. Skin: warm and dry Neuro:  Grossly normal  EKG:  Sinus rhythm first degree AV block. Rate 95 bpm. Right axis deviation prolonged QT which is not new.    ASSESSMENT AND PLAN:  Problem List Items Addressed This Visit    VF (ventricular fibrillation) ([place])   Ischemic cardiomyopathy   Acute on chronic systolic heart failure ([place])    Other Visit Diagnoses    Acute congestive heart  failure, unspecified congestive heart failure type ([place])    -  Primary    Relevant Orders    EKG 12-Lead    Polypharmacy        Relevant Orders    Basic metabolic panel    Magnesium      Mr. was instructed not to drive for 6 months which is also communicated syncopal episode back in December. His weight has decreased 6 pounds over the last 3 days. However, he still is volume overloaded.  Continue his Lasix at current dose. We'll check a basic metabolic panel to check potassium and creatinine. We'll also check a magnesium since he had  V. Fib.   He otherwise appears to be doing well. We'll see him back next Friday 1:00 for follow-up heart failure.[name] [name]

## 2015-05-23 ENCOUNTER — Other Ambulatory Visit: Payer: Self-pay | Admitting: *Deleted

## 2015-05-24 DIAGNOSIS — N390 Urinary tract infection, site not specified: Secondary | ICD-10-CM | POA: Diagnosis not present

## 2015-05-24 DIAGNOSIS — I2510 Atherosclerotic heart disease of native coronary artery without angina pectoris: Secondary | ICD-10-CM | POA: Diagnosis not present

## 2015-05-24 DIAGNOSIS — E1142 Type 2 diabetes mellitus with diabetic polyneuropathy: Secondary | ICD-10-CM | POA: Diagnosis not present

## 2015-05-24 DIAGNOSIS — E785 Hyperlipidemia, unspecified: Secondary | ICD-10-CM | POA: Diagnosis not present

## 2015-05-24 DIAGNOSIS — I255 Ischemic cardiomyopathy: Secondary | ICD-10-CM | POA: Diagnosis not present

## 2015-05-24 DIAGNOSIS — I129 Hypertensive chronic kidney disease with stage 1 through stage 4 chronic kidney disease, or unspecified chronic kidney disease: Secondary | ICD-10-CM | POA: Diagnosis not present

## 2015-05-24 DIAGNOSIS — R55 Syncope and collapse: Secondary | ICD-10-CM | POA: Diagnosis not present

## 2015-05-24 DIAGNOSIS — N189 Chronic kidney disease, unspecified: Secondary | ICD-10-CM | POA: Diagnosis not present

## 2015-05-24 DIAGNOSIS — I5042 Chronic combined systolic (congestive) and diastolic (congestive) heart failure: Secondary | ICD-10-CM | POA: Diagnosis not present

## 2015-05-27 ENCOUNTER — Ambulatory Visit: Payer: Commercial Managed Care - HMO | Admitting: Physician Assistant

## 2015-05-30 LAB — CUP PACEART REMOTE DEVICE CHECK
Battery Remaining Longevity: 78 mo
Battery Remaining Percentage: 77 %
Battery Voltage: 2.98 V
Date Time Interrogation Session: 20170121000613
HIGH POWER IMPEDANCE MEASURED VALUE: 35 Ohm
HIGH POWER IMPEDANCE MEASURED VALUE: 36 Ohm
Implantable Lead Implant Date: 20070518
Implantable Lead Model: 7001
Lead Channel Impedance Value: 390 Ohm
Lead Channel Pacing Threshold Pulse Width: 0.5 ms
Lead Channel Sensing Intrinsic Amplitude: 12 mV
Lead Channel Setting Pacing Pulse Width: 0.5 ms
MDC IDC LEAD LOCATION: 753860
MDC IDC MSMT LEADCHNL RV PACING THRESHOLD AMPLITUDE: 1 V
MDC IDC PG SERIAL: 1109991
MDC IDC SET LEADCHNL RV PACING AMPLITUDE: 2.5 V
MDC IDC SET LEADCHNL RV SENSING SENSITIVITY: 0.5 mV
MDC IDC STAT BRADY RV PERCENT PACED: 1 %

## 2015-05-31 ENCOUNTER — Ambulatory Visit (HOSPITAL_COMMUNITY)
Admission: RE | Admit: 2015-05-31 | Discharge: 2015-05-31 | Disposition: A | Discharge status: Home / Self Care | Payer: Commercial Managed Care - HMO | Source: Ambulatory Visit | Attending: Surgery | Admitting: Surgery

## 2015-05-31 ENCOUNTER — Encounter (HOSPITAL_BASED_OUTPATIENT_CLINIC_OR_DEPARTMENT_OTHER): Payer: Commercial Managed Care - HMO | Attending: Internal Medicine

## 2015-05-31 ENCOUNTER — Other Ambulatory Visit: Payer: Self-pay | Admitting: Surgery

## 2015-05-31 DIAGNOSIS — M109 Gout, unspecified: Secondary | ICD-10-CM | POA: Diagnosis not present

## 2015-05-31 DIAGNOSIS — E1140 Type 2 diabetes mellitus with diabetic neuropathy, unspecified: Secondary | ICD-10-CM | POA: Diagnosis not present

## 2015-05-31 DIAGNOSIS — G4730 Sleep apnea, unspecified: Secondary | ICD-10-CM | POA: Diagnosis not present

## 2015-05-31 DIAGNOSIS — L97421 Non-pressure chronic ulcer of left heel and midfoot limited to breakdown of skin: Secondary | ICD-10-CM | POA: Insufficient documentation

## 2015-05-31 DIAGNOSIS — L97821 Non-pressure chronic ulcer of other part of left lower leg limited to breakdown of skin: Secondary | ICD-10-CM | POA: Diagnosis not present

## 2015-05-31 DIAGNOSIS — E11621 Type 2 diabetes mellitus with foot ulcer: Secondary | ICD-10-CM | POA: Insufficient documentation

## 2015-05-31 DIAGNOSIS — L97521 Non-pressure chronic ulcer of other part of left foot limited to breakdown of skin: Secondary | ICD-10-CM | POA: Diagnosis not present

## 2015-05-31 DIAGNOSIS — I5040 Unspecified combined systolic (congestive) and diastolic (congestive) heart failure: Secondary | ICD-10-CM | POA: Insufficient documentation

## 2015-05-31 DIAGNOSIS — Z950 Presence of cardiac pacemaker: Secondary | ICD-10-CM | POA: Diagnosis not present

## 2015-05-31 DIAGNOSIS — I429 Cardiomyopathy, unspecified: Secondary | ICD-10-CM | POA: Insufficient documentation

## 2015-05-31 DIAGNOSIS — S91302A Unspecified open wound, left foot, initial encounter: Secondary | ICD-10-CM | POA: Diagnosis not present

## 2015-05-31 DIAGNOSIS — N189 Chronic kidney disease, unspecified: Secondary | ICD-10-CM | POA: Insufficient documentation

## 2015-05-31 DIAGNOSIS — Z794 Long term (current) use of insulin: Secondary | ICD-10-CM | POA: Insufficient documentation

## 2015-05-31 DIAGNOSIS — I2510 Atherosclerotic heart disease of native coronary artery without angina pectoris: Secondary | ICD-10-CM | POA: Insufficient documentation

## 2015-05-31 DIAGNOSIS — I890 Lymphedema, not elsewhere classified: Secondary | ICD-10-CM | POA: Diagnosis not present

## 2015-05-31 DIAGNOSIS — X58XXXA Exposure to other specified factors, initial encounter: Secondary | ICD-10-CM | POA: Diagnosis not present

## 2015-05-31 DIAGNOSIS — L089 Local infection of the skin and subcutaneous tissue, unspecified: Secondary | ICD-10-CM | POA: Insufficient documentation

## 2015-05-31 DIAGNOSIS — L97212 Non-pressure chronic ulcer of right calf with fat layer exposed: Secondary | ICD-10-CM | POA: Diagnosis not present

## 2015-05-31 DIAGNOSIS — I252 Old myocardial infarction: Secondary | ICD-10-CM | POA: Insufficient documentation

## 2015-05-31 DIAGNOSIS — M86172 Other acute osteomyelitis, left ankle and foot: Secondary | ICD-10-CM

## 2015-05-31 DIAGNOSIS — L84 Corns and callosities: Secondary | ICD-10-CM | POA: Diagnosis not present

## 2015-05-31 DIAGNOSIS — I130 Hypertensive heart and chronic kidney disease with heart failure and stage 1 through stage 4 chronic kidney disease, or unspecified chronic kidney disease: Secondary | ICD-10-CM | POA: Diagnosis not present

## 2015-06-01 DIAGNOSIS — I255 Ischemic cardiomyopathy: Secondary | ICD-10-CM | POA: Diagnosis not present

## 2015-06-01 DIAGNOSIS — I129 Hypertensive chronic kidney disease with stage 1 through stage 4 chronic kidney disease, or unspecified chronic kidney disease: Secondary | ICD-10-CM | POA: Diagnosis not present

## 2015-06-01 DIAGNOSIS — E1142 Type 2 diabetes mellitus with diabetic polyneuropathy: Secondary | ICD-10-CM | POA: Diagnosis not present

## 2015-06-01 DIAGNOSIS — N189 Chronic kidney disease, unspecified: Secondary | ICD-10-CM | POA: Diagnosis not present

## 2015-06-01 DIAGNOSIS — N390 Urinary tract infection, site not specified: Secondary | ICD-10-CM | POA: Diagnosis not present

## 2015-06-01 DIAGNOSIS — E785 Hyperlipidemia, unspecified: Secondary | ICD-10-CM | POA: Diagnosis not present

## 2015-06-01 DIAGNOSIS — R55 Syncope and collapse: Secondary | ICD-10-CM | POA: Diagnosis not present

## 2015-06-01 DIAGNOSIS — I5042 Chronic combined systolic (congestive) and diastolic (congestive) heart failure: Secondary | ICD-10-CM | POA: Diagnosis not present

## 2015-06-01 DIAGNOSIS — I2510 Atherosclerotic heart disease of native coronary artery without angina pectoris: Secondary | ICD-10-CM | POA: Diagnosis not present

## 2015-06-03 ENCOUNTER — Encounter: Payer: Self-pay | Admitting: Cardiology

## 2015-06-07 ENCOUNTER — Other Ambulatory Visit: Payer: Self-pay | Admitting: Surgery

## 2015-06-07 ENCOUNTER — Ambulatory Visit (HOSPITAL_COMMUNITY)
Admission: RE | Admit: 2015-06-07 | Discharge: 2015-06-07 | Disposition: A | Discharge status: Home / Self Care | Payer: Commercial Managed Care - HMO | Source: Ambulatory Visit | Attending: Vascular Surgery | Admitting: Vascular Surgery

## 2015-06-07 DIAGNOSIS — L97909 Non-pressure chronic ulcer of unspecified part of unspecified lower leg with unspecified severity: Secondary | ICD-10-CM | POA: Diagnosis not present

## 2015-06-07 DIAGNOSIS — N189 Chronic kidney disease, unspecified: Secondary | ICD-10-CM | POA: Diagnosis not present

## 2015-06-07 DIAGNOSIS — L97929 Non-pressure chronic ulcer of unspecified part of left lower leg with unspecified severity: Secondary | ICD-10-CM | POA: Diagnosis not present

## 2015-06-07 DIAGNOSIS — M109 Gout, unspecified: Secondary | ICD-10-CM | POA: Diagnosis not present

## 2015-06-07 DIAGNOSIS — L97521 Non-pressure chronic ulcer of other part of left foot limited to breakdown of skin: Secondary | ICD-10-CM | POA: Diagnosis not present

## 2015-06-07 DIAGNOSIS — L97821 Non-pressure chronic ulcer of other part of left lower leg limited to breakdown of skin: Secondary | ICD-10-CM | POA: Diagnosis not present

## 2015-06-07 DIAGNOSIS — L97919 Non-pressure chronic ulcer of unspecified part of right lower leg with unspecified severity: Secondary | ICD-10-CM | POA: Insufficient documentation

## 2015-06-07 DIAGNOSIS — I130 Hypertensive heart and chronic kidney disease with heart failure and stage 1 through stage 4 chronic kidney disease, or unspecified chronic kidney disease: Secondary | ICD-10-CM | POA: Diagnosis not present

## 2015-06-07 DIAGNOSIS — L97811 Non-pressure chronic ulcer of other part of right lower leg limited to breakdown of skin: Secondary | ICD-10-CM | POA: Diagnosis not present

## 2015-06-07 DIAGNOSIS — L97421 Non-pressure chronic ulcer of left heel and midfoot limited to breakdown of skin: Secondary | ICD-10-CM | POA: Diagnosis not present

## 2015-06-07 DIAGNOSIS — I2510 Atherosclerotic heart disease of native coronary artery without angina pectoris: Secondary | ICD-10-CM | POA: Diagnosis not present

## 2015-06-07 DIAGNOSIS — E1140 Type 2 diabetes mellitus with diabetic neuropathy, unspecified: Secondary | ICD-10-CM | POA: Diagnosis not present

## 2015-06-07 DIAGNOSIS — E11621 Type 2 diabetes mellitus with foot ulcer: Secondary | ICD-10-CM | POA: Diagnosis not present

## 2015-06-07 DIAGNOSIS — I5040 Unspecified combined systolic (congestive) and diastolic (congestive) heart failure: Secondary | ICD-10-CM | POA: Diagnosis not present

## 2015-06-08 DIAGNOSIS — R55 Syncope and collapse: Secondary | ICD-10-CM | POA: Diagnosis not present

## 2015-06-08 DIAGNOSIS — E785 Hyperlipidemia, unspecified: Secondary | ICD-10-CM | POA: Diagnosis not present

## 2015-06-08 DIAGNOSIS — I5042 Chronic combined systolic (congestive) and diastolic (congestive) heart failure: Secondary | ICD-10-CM | POA: Diagnosis not present

## 2015-06-08 DIAGNOSIS — N189 Chronic kidney disease, unspecified: Secondary | ICD-10-CM | POA: Diagnosis not present

## 2015-06-08 DIAGNOSIS — E1142 Type 2 diabetes mellitus with diabetic polyneuropathy: Secondary | ICD-10-CM | POA: Diagnosis not present

## 2015-06-08 DIAGNOSIS — N390 Urinary tract infection, site not specified: Secondary | ICD-10-CM | POA: Diagnosis not present

## 2015-06-08 DIAGNOSIS — I255 Ischemic cardiomyopathy: Secondary | ICD-10-CM | POA: Diagnosis not present

## 2015-06-08 DIAGNOSIS — I129 Hypertensive chronic kidney disease with stage 1 through stage 4 chronic kidney disease, or unspecified chronic kidney disease: Secondary | ICD-10-CM | POA: Diagnosis not present

## 2015-06-08 DIAGNOSIS — I2510 Atherosclerotic heart disease of native coronary artery without angina pectoris: Secondary | ICD-10-CM | POA: Diagnosis not present

## 2015-06-14 DIAGNOSIS — E11621 Type 2 diabetes mellitus with foot ulcer: Secondary | ICD-10-CM | POA: Diagnosis not present

## 2015-06-14 DIAGNOSIS — L97521 Non-pressure chronic ulcer of other part of left foot limited to breakdown of skin: Secondary | ICD-10-CM | POA: Diagnosis not present

## 2015-06-14 DIAGNOSIS — E1140 Type 2 diabetes mellitus with diabetic neuropathy, unspecified: Secondary | ICD-10-CM | POA: Diagnosis not present

## 2015-06-14 DIAGNOSIS — L97421 Non-pressure chronic ulcer of left heel and midfoot limited to breakdown of skin: Secondary | ICD-10-CM | POA: Diagnosis not present

## 2015-06-14 DIAGNOSIS — I890 Lymphedema, not elsewhere classified: Secondary | ICD-10-CM | POA: Diagnosis not present

## 2015-06-14 DIAGNOSIS — L97821 Non-pressure chronic ulcer of other part of left lower leg limited to breakdown of skin: Secondary | ICD-10-CM | POA: Diagnosis not present

## 2015-06-14 DIAGNOSIS — N189 Chronic kidney disease, unspecified: Secondary | ICD-10-CM | POA: Diagnosis not present

## 2015-06-14 DIAGNOSIS — I5040 Unspecified combined systolic (congestive) and diastolic (congestive) heart failure: Secondary | ICD-10-CM | POA: Diagnosis not present

## 2015-06-14 DIAGNOSIS — I130 Hypertensive heart and chronic kidney disease with heart failure and stage 1 through stage 4 chronic kidney disease, or unspecified chronic kidney disease: Secondary | ICD-10-CM | POA: Diagnosis not present

## 2015-06-14 DIAGNOSIS — M109 Gout, unspecified: Secondary | ICD-10-CM | POA: Diagnosis not present

## 2015-06-14 DIAGNOSIS — I2510 Atherosclerotic heart disease of native coronary artery without angina pectoris: Secondary | ICD-10-CM | POA: Diagnosis not present

## 2015-06-14 DIAGNOSIS — L97211 Non-pressure chronic ulcer of right calf limited to breakdown of skin: Secondary | ICD-10-CM | POA: Diagnosis not present

## 2015-06-15 DIAGNOSIS — R55 Syncope and collapse: Secondary | ICD-10-CM | POA: Diagnosis not present

## 2015-06-15 DIAGNOSIS — E1142 Type 2 diabetes mellitus with diabetic polyneuropathy: Secondary | ICD-10-CM | POA: Diagnosis not present

## 2015-06-15 DIAGNOSIS — N390 Urinary tract infection, site not specified: Secondary | ICD-10-CM | POA: Diagnosis not present

## 2015-06-15 DIAGNOSIS — I5042 Chronic combined systolic (congestive) and diastolic (congestive) heart failure: Secondary | ICD-10-CM | POA: Diagnosis not present

## 2015-06-15 DIAGNOSIS — I129 Hypertensive chronic kidney disease with stage 1 through stage 4 chronic kidney disease, or unspecified chronic kidney disease: Secondary | ICD-10-CM | POA: Diagnosis not present

## 2015-06-15 DIAGNOSIS — N189 Chronic kidney disease, unspecified: Secondary | ICD-10-CM | POA: Diagnosis not present

## 2015-06-15 DIAGNOSIS — I2510 Atherosclerotic heart disease of native coronary artery without angina pectoris: Secondary | ICD-10-CM | POA: Diagnosis not present

## 2015-06-15 DIAGNOSIS — I255 Ischemic cardiomyopathy: Secondary | ICD-10-CM | POA: Diagnosis not present

## 2015-06-15 DIAGNOSIS — E785 Hyperlipidemia, unspecified: Secondary | ICD-10-CM | POA: Diagnosis not present

## 2015-06-21 DIAGNOSIS — L97221 Non-pressure chronic ulcer of left calf limited to breakdown of skin: Secondary | ICD-10-CM | POA: Diagnosis not present

## 2015-06-21 DIAGNOSIS — L97421 Non-pressure chronic ulcer of left heel and midfoot limited to breakdown of skin: Secondary | ICD-10-CM | POA: Diagnosis not present

## 2015-06-21 DIAGNOSIS — E11621 Type 2 diabetes mellitus with foot ulcer: Secondary | ICD-10-CM | POA: Diagnosis not present

## 2015-06-21 DIAGNOSIS — M109 Gout, unspecified: Secondary | ICD-10-CM | POA: Diagnosis not present

## 2015-06-21 DIAGNOSIS — I2510 Atherosclerotic heart disease of native coronary artery without angina pectoris: Secondary | ICD-10-CM | POA: Diagnosis not present

## 2015-06-21 DIAGNOSIS — E11622 Type 2 diabetes mellitus with other skin ulcer: Secondary | ICD-10-CM | POA: Diagnosis not present

## 2015-06-21 DIAGNOSIS — E1140 Type 2 diabetes mellitus with diabetic neuropathy, unspecified: Secondary | ICD-10-CM | POA: Diagnosis not present

## 2015-06-21 DIAGNOSIS — N189 Chronic kidney disease, unspecified: Secondary | ICD-10-CM | POA: Diagnosis not present

## 2015-06-21 DIAGNOSIS — L97211 Non-pressure chronic ulcer of right calf limited to breakdown of skin: Secondary | ICD-10-CM | POA: Diagnosis not present

## 2015-06-21 DIAGNOSIS — I130 Hypertensive heart and chronic kidney disease with heart failure and stage 1 through stage 4 chronic kidney disease, or unspecified chronic kidney disease: Secondary | ICD-10-CM | POA: Diagnosis not present

## 2015-06-21 DIAGNOSIS — L97821 Non-pressure chronic ulcer of other part of left lower leg limited to breakdown of skin: Secondary | ICD-10-CM | POA: Diagnosis not present

## 2015-06-21 DIAGNOSIS — I5040 Unspecified combined systolic (congestive) and diastolic (congestive) heart failure: Secondary | ICD-10-CM | POA: Diagnosis not present

## 2015-06-23 DIAGNOSIS — E1122 Type 2 diabetes mellitus with diabetic chronic kidney disease: Secondary | ICD-10-CM | POA: Diagnosis not present

## 2015-06-23 DIAGNOSIS — E1142 Type 2 diabetes mellitus with diabetic polyneuropathy: Secondary | ICD-10-CM | POA: Diagnosis not present

## 2015-06-23 DIAGNOSIS — I2510 Atherosclerotic heart disease of native coronary artery without angina pectoris: Secondary | ICD-10-CM | POA: Diagnosis not present

## 2015-06-23 DIAGNOSIS — N189 Chronic kidney disease, unspecified: Secondary | ICD-10-CM | POA: Diagnosis not present

## 2015-06-23 DIAGNOSIS — I890 Lymphedema, not elsewhere classified: Secondary | ICD-10-CM | POA: Diagnosis not present

## 2015-06-23 DIAGNOSIS — I255 Ischemic cardiomyopathy: Secondary | ICD-10-CM | POA: Diagnosis not present

## 2015-06-23 DIAGNOSIS — I5042 Chronic combined systolic (congestive) and diastolic (congestive) heart failure: Secondary | ICD-10-CM | POA: Diagnosis not present

## 2015-06-23 DIAGNOSIS — I129 Hypertensive chronic kidney disease with stage 1 through stage 4 chronic kidney disease, or unspecified chronic kidney disease: Secondary | ICD-10-CM | POA: Diagnosis not present

## 2015-06-23 DIAGNOSIS — R55 Syncope and collapse: Secondary | ICD-10-CM | POA: Diagnosis not present

## 2015-06-28 ENCOUNTER — Encounter (HOSPITAL_BASED_OUTPATIENT_CLINIC_OR_DEPARTMENT_OTHER): Payer: Commercial Managed Care - HMO | Attending: Surgery

## 2015-06-28 DIAGNOSIS — L97421 Non-pressure chronic ulcer of left heel and midfoot limited to breakdown of skin: Secondary | ICD-10-CM | POA: Insufficient documentation

## 2015-06-28 DIAGNOSIS — E11621 Type 2 diabetes mellitus with foot ulcer: Secondary | ICD-10-CM | POA: Diagnosis not present

## 2015-06-28 DIAGNOSIS — I110 Hypertensive heart disease with heart failure: Secondary | ICD-10-CM | POA: Diagnosis not present

## 2015-06-28 DIAGNOSIS — I2510 Atherosclerotic heart disease of native coronary artery without angina pectoris: Secondary | ICD-10-CM | POA: Diagnosis not present

## 2015-06-28 DIAGNOSIS — G4730 Sleep apnea, unspecified: Secondary | ICD-10-CM | POA: Diagnosis not present

## 2015-06-28 DIAGNOSIS — L97821 Non-pressure chronic ulcer of other part of left lower leg limited to breakdown of skin: Secondary | ICD-10-CM | POA: Insufficient documentation

## 2015-06-28 DIAGNOSIS — I252 Old myocardial infarction: Secondary | ICD-10-CM | POA: Insufficient documentation

## 2015-06-28 DIAGNOSIS — L84 Corns and callosities: Secondary | ICD-10-CM | POA: Diagnosis not present

## 2015-06-28 DIAGNOSIS — E1140 Type 2 diabetes mellitus with diabetic neuropathy, unspecified: Secondary | ICD-10-CM | POA: Diagnosis not present

## 2015-06-28 DIAGNOSIS — I890 Lymphedema, not elsewhere classified: Secondary | ICD-10-CM | POA: Insufficient documentation

## 2015-06-28 DIAGNOSIS — I509 Heart failure, unspecified: Secondary | ICD-10-CM | POA: Diagnosis not present

## 2015-06-28 DIAGNOSIS — L97212 Non-pressure chronic ulcer of right calf with fat layer exposed: Secondary | ICD-10-CM | POA: Diagnosis not present

## 2015-06-28 DIAGNOSIS — L97811 Non-pressure chronic ulcer of other part of right lower leg limited to breakdown of skin: Secondary | ICD-10-CM | POA: Diagnosis not present

## 2015-06-28 DIAGNOSIS — L97522 Non-pressure chronic ulcer of other part of left foot with fat layer exposed: Secondary | ICD-10-CM | POA: Diagnosis not present

## 2015-06-29 ENCOUNTER — Encounter: Payer: Commercial Managed Care - HMO | Admitting: Internal Medicine

## 2015-06-30 DIAGNOSIS — I5042 Chronic combined systolic (congestive) and diastolic (congestive) heart failure: Secondary | ICD-10-CM | POA: Diagnosis not present

## 2015-06-30 DIAGNOSIS — E1142 Type 2 diabetes mellitus with diabetic polyneuropathy: Secondary | ICD-10-CM | POA: Diagnosis not present

## 2015-06-30 DIAGNOSIS — I890 Lymphedema, not elsewhere classified: Secondary | ICD-10-CM | POA: Diagnosis not present

## 2015-06-30 DIAGNOSIS — R55 Syncope and collapse: Secondary | ICD-10-CM | POA: Diagnosis not present

## 2015-06-30 DIAGNOSIS — E1122 Type 2 diabetes mellitus with diabetic chronic kidney disease: Secondary | ICD-10-CM | POA: Diagnosis not present

## 2015-06-30 DIAGNOSIS — I129 Hypertensive chronic kidney disease with stage 1 through stage 4 chronic kidney disease, or unspecified chronic kidney disease: Secondary | ICD-10-CM | POA: Diagnosis not present

## 2015-06-30 DIAGNOSIS — N189 Chronic kidney disease, unspecified: Secondary | ICD-10-CM | POA: Diagnosis not present

## 2015-06-30 DIAGNOSIS — I255 Ischemic cardiomyopathy: Secondary | ICD-10-CM | POA: Diagnosis not present

## 2015-06-30 DIAGNOSIS — I2510 Atherosclerotic heart disease of native coronary artery without angina pectoris: Secondary | ICD-10-CM | POA: Diagnosis not present

## 2015-07-01 ENCOUNTER — Encounter (HOSPITAL_COMMUNITY): Payer: Commercial Managed Care - HMO

## 2015-07-05 ENCOUNTER — Ambulatory Visit (INDEPENDENT_AMBULATORY_CARE_PROVIDER_SITE_OTHER): Payer: Commercial Managed Care - HMO | Admitting: Cardiology

## 2015-07-05 ENCOUNTER — Encounter: Payer: Self-pay | Admitting: Cardiology

## 2015-07-05 VITALS — BP 114/80 | HR 96 | Ht 74.0 in | Wt 209.0 lb

## 2015-07-05 DIAGNOSIS — I509 Heart failure, unspecified: Secondary | ICD-10-CM | POA: Diagnosis not present

## 2015-07-05 DIAGNOSIS — L97522 Non-pressure chronic ulcer of other part of left foot with fat layer exposed: Secondary | ICD-10-CM | POA: Diagnosis not present

## 2015-07-05 DIAGNOSIS — L97421 Non-pressure chronic ulcer of left heel and midfoot limited to breakdown of skin: Secondary | ICD-10-CM | POA: Diagnosis not present

## 2015-07-05 DIAGNOSIS — I5042 Chronic combined systolic (congestive) and diastolic (congestive) heart failure: Secondary | ICD-10-CM

## 2015-07-05 DIAGNOSIS — L97821 Non-pressure chronic ulcer of other part of left lower leg limited to breakdown of skin: Secondary | ICD-10-CM | POA: Diagnosis not present

## 2015-07-05 DIAGNOSIS — I2510 Atherosclerotic heart disease of native coronary artery without angina pectoris: Secondary | ICD-10-CM | POA: Diagnosis not present

## 2015-07-05 DIAGNOSIS — L97212 Non-pressure chronic ulcer of right calf with fat layer exposed: Secondary | ICD-10-CM | POA: Diagnosis not present

## 2015-07-05 DIAGNOSIS — G4730 Sleep apnea, unspecified: Secondary | ICD-10-CM | POA: Diagnosis not present

## 2015-07-05 DIAGNOSIS — I110 Hypertensive heart disease with heart failure: Secondary | ICD-10-CM | POA: Diagnosis not present

## 2015-07-05 DIAGNOSIS — L97811 Non-pressure chronic ulcer of other part of right lower leg limited to breakdown of skin: Secondary | ICD-10-CM | POA: Diagnosis not present

## 2015-07-05 DIAGNOSIS — I890 Lymphedema, not elsewhere classified: Secondary | ICD-10-CM | POA: Diagnosis not present

## 2015-07-05 DIAGNOSIS — E11621 Type 2 diabetes mellitus with foot ulcer: Secondary | ICD-10-CM | POA: Diagnosis not present

## 2015-07-05 MED ORDER — HYDRALAZINE HCL 10 MG PO TABS: 15.0000 mg | ORAL_TABLET | Freq: Three times a day (TID) | ORAL | Status: DC
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
## 2015-07-05 NOTE — Patient Instructions (Addendum)
Your physician has recommended you make the following change in your medication: Increase Hydralazine to 1 1/2 tablets three times a day  You have been referred to [facility]

## 2015-07-08 ENCOUNTER — Ambulatory Visit (INDEPENDENT_AMBULATORY_CARE_PROVIDER_SITE_OTHER): Payer: Commercial Managed Care - HMO | Admitting: Internal Medicine

## 2015-07-08 ENCOUNTER — Encounter: Payer: Self-pay | Admitting: Internal Medicine

## 2015-07-08 VITALS — BP 126/82 | HR 104 | Ht 74.0 in | Wt 208.0 lb

## 2015-07-08 DIAGNOSIS — I4901 Ventricular fibrillation: Secondary | ICD-10-CM | POA: Diagnosis not present

## 2015-07-08 DIAGNOSIS — I255 Ischemic cardiomyopathy: Secondary | ICD-10-CM | POA: Diagnosis not present

## 2015-07-08 DIAGNOSIS — I5042 Chronic combined systolic (congestive) and diastolic (congestive) heart failure: Secondary | ICD-10-CM | POA: Diagnosis not present

## 2015-07-08 DIAGNOSIS — R55 Syncope and collapse: Secondary | ICD-10-CM | POA: Diagnosis not present

## 2015-07-08 DIAGNOSIS — I129 Hypertensive chronic kidney disease with stage 1 through stage 4 chronic kidney disease, or unspecified chronic kidney disease: Secondary | ICD-10-CM | POA: Diagnosis not present

## 2015-07-08 DIAGNOSIS — N189 Chronic kidney disease, unspecified: Secondary | ICD-10-CM | POA: Diagnosis not present

## 2015-07-08 DIAGNOSIS — E1122 Type 2 diabetes mellitus with diabetic chronic kidney disease: Secondary | ICD-10-CM | POA: Diagnosis not present

## 2015-07-08 DIAGNOSIS — E1142 Type 2 diabetes mellitus with diabetic polyneuropathy: Secondary | ICD-10-CM | POA: Diagnosis not present

## 2015-07-08 DIAGNOSIS — I890 Lymphedema, not elsewhere classified: Secondary | ICD-10-CM | POA: Diagnosis not present

## 2015-07-08 DIAGNOSIS — I2510 Atherosclerotic heart disease of native coronary artery without angina pectoris: Secondary | ICD-10-CM | POA: Diagnosis not present

## 2015-07-08 LAB — BASIC METABOLIC PANEL
BUN: 22 mg/dL (ref 7–25)
CO2: 24 mmol/L (ref 20–31)
Calcium: 8.7 mg/dL (ref 8.6–10.3)
Chloride: 109 mmol/L (ref 98–110)
Creat: 1.39 mg/dL — ABNORMAL HIGH (ref 0.70–1.25)
Glucose, Bld: 226 mg/dL — ABNORMAL HIGH (ref 65–99)
POTASSIUM: 3.8 mmol/L (ref 3.5–5.3)
SODIUM: 143 mmol/L (ref 135–146)

## 2015-07-08 MED ORDER — TORSEMIDE 20 MG PO TABS: 20.0000 mg | ORAL_TABLET | Freq: Two times a day (BID) | ORAL | Status: DC

## 2015-07-08 MED ORDER — IVABRADINE HCL 5 MG PO TABS: 2.5000 mg | ORAL_TABLET | Freq: Two times a day (BID) | ORAL | Status: DC

## 2015-07-08 NOTE — Patient Instructions (Addendum)
Medication Instructions:  Your physician has recommended you make the following change in your medication:  1) STOP Lasix 2) START Torsemide 20 mg twice a day 3) START Corlanor 2.5 mg daily -- start this in 1 week  Labwork: Today: BMET   Testing/Procedures: None ordered  Follow-Up: Remote monitoring is used to monitor your Pacemaker of ICD from home. This monitoring reduces the number of office visits required to check your device to one time per year. It allows [place] to keep an eye on the functioning of your device to ensure it is working properly. You are scheduled for a device check from home on 10/10/2015. You may send your transmission at any time that day. If you have a wireless device, the transmission will be sent automatically. After your physician reviews your transmission, you will receive a postcard with your next transmission date.  Your physician wants you to follow-up in: 1 year with Dr. Caryl Comes.  You will receive a reminder letter in the mail two months in advance. If you don't receive a letter, please call our office to schedule the follow-up appointment.  If you need a refill on your cardiac medications before your next appointment, please call your pharmacy.  Thank you for choosing [facility]!!
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
## 2015-07-11 ENCOUNTER — Telehealth: Payer: Self-pay | Admitting: Internal Medicine

## 2015-07-11 ENCOUNTER — Telehealth: Payer: Self-pay

## 2015-07-11 DIAGNOSIS — E1140 Type 2 diabetes mellitus with diabetic neuropathy, unspecified: Secondary | ICD-10-CM

## 2015-07-11 DIAGNOSIS — E1121 Type 2 diabetes mellitus with diabetic nephropathy: Secondary | ICD-10-CM

## 2015-07-11 DIAGNOSIS — IMO0002 Reserved for concepts with insufficient information to code with codable children: Secondary | ICD-10-CM

## 2015-07-11 DIAGNOSIS — E1165 Type 2 diabetes mellitus with hyperglycemia: Principal | ICD-10-CM

## 2015-07-11 NOTE — Telephone Encounter (Signed)
Prior auth for Corlanor 5mg  submitted to [facility] via Cover My Meds.

## 2015-07-11 NOTE — Telephone Encounter (Signed)
Angie from [facility] call to ask if pt is still on the following med . Pt wife does not think he is taking this med she would like to have it sent to the pharmacy she goes to. [facility] contacted [employer] and they that show where pt was getting this med     insulin lispro (HUMALOG) 100 UNIT/ML injection    insulin glargine (LANTUS) 100 UNIT/ML injection   Pharmacy [facility] [address]   Janace Hoard [phone]

## 2015-07-12 ENCOUNTER — Telehealth: Payer: Self-pay

## 2015-07-12 DIAGNOSIS — L97212 Non-pressure chronic ulcer of right calf with fat layer exposed: Secondary | ICD-10-CM | POA: Diagnosis not present

## 2015-07-12 DIAGNOSIS — G4730 Sleep apnea, unspecified: Secondary | ICD-10-CM | POA: Diagnosis not present

## 2015-07-12 DIAGNOSIS — L97421 Non-pressure chronic ulcer of left heel and midfoot limited to breakdown of skin: Secondary | ICD-10-CM | POA: Diagnosis not present

## 2015-07-12 DIAGNOSIS — L97821 Non-pressure chronic ulcer of other part of left lower leg limited to breakdown of skin: Secondary | ICD-10-CM | POA: Diagnosis not present

## 2015-07-12 DIAGNOSIS — I890 Lymphedema, not elsewhere classified: Secondary | ICD-10-CM | POA: Diagnosis not present

## 2015-07-12 DIAGNOSIS — I509 Heart failure, unspecified: Secondary | ICD-10-CM | POA: Diagnosis not present

## 2015-07-12 DIAGNOSIS — E11621 Type 2 diabetes mellitus with foot ulcer: Secondary | ICD-10-CM | POA: Diagnosis not present

## 2015-07-12 DIAGNOSIS — L97811 Non-pressure chronic ulcer of other part of right lower leg limited to breakdown of skin: Secondary | ICD-10-CM | POA: Diagnosis not present

## 2015-07-12 DIAGNOSIS — I2510 Atherosclerotic heart disease of native coronary artery without angina pectoris: Secondary | ICD-10-CM | POA: Diagnosis not present

## 2015-07-12 DIAGNOSIS — L97222 Non-pressure chronic ulcer of left calf with fat layer exposed: Secondary | ICD-10-CM | POA: Diagnosis not present

## 2015-07-12 DIAGNOSIS — I110 Hypertensive heart disease with heart failure: Secondary | ICD-10-CM | POA: Diagnosis not present

## 2015-07-12 NOTE — Telephone Encounter (Signed)
Corlanor denied by [facility]. I am doing an urgent appeal.

## 2015-07-13 ENCOUNTER — Telehealth: Payer: Self-pay | Admitting: Internal Medicine

## 2015-07-13 LAB — CUP PACEART INCLINIC DEVICE CHECK
Battery Remaining Longevity: 78
Brady Statistic RV Percent Paced: 0.06 %
HighPow Impedance: 32.9 Ohm
Implantable Lead Location: 753860
Lead Channel Impedance Value: 375 Ohm
Lead Channel Pacing Threshold Amplitude: 1 V
Lead Channel Setting Pacing Amplitude: 2.5 V
Lead Channel Setting Pacing Pulse Width: 0.5 ms
Lead Channel Setting Sensing Sensitivity: 0.5 mV
MDC IDC LEAD IMPLANT DT: 20070518
MDC IDC LEAD MODEL: 7001
MDC IDC MSMT LEADCHNL RV PACING THRESHOLD AMPLITUDE: 1 V
MDC IDC MSMT LEADCHNL RV PACING THRESHOLD PULSEWIDTH: 0.5 ms
MDC IDC MSMT LEADCHNL RV PACING THRESHOLD PULSEWIDTH: 0.5 ms
MDC IDC MSMT LEADCHNL RV SENSING INTR AMPL: 12 mV
MDC IDC PG SERIAL: 1109991
MDC IDC SESS DTM: 20170317171845

## 2015-07-13 MED ORDER — INSULIN PEN NEEDLE 32G X 4 MM MISC: Status: DC

## 2015-07-13 MED ORDER — INSULIN LISPRO 100 UNIT/ML ~~LOC~~ SOLN: SUBCUTANEOUS | Status: DC

## 2015-07-13 MED ORDER — INSULIN GLARGINE 100 UNIT/ML ~~LOC~~ SOLN: 75.0000 [IU] | Freq: Every day | SUBCUTANEOUS | Status: DC

## 2015-07-13 NOTE — Telephone Encounter (Signed)
Refills sent

## 2015-07-13 NOTE — Telephone Encounter (Signed)
[place] for RF x 1 but I suggest referral to Dr. Cruzita Lederer  Re: diabetes mgt

## 2015-07-13 NOTE — Telephone Encounter (Signed)
Rx sent and referral placed.  Patient is aware.

## 2015-07-13 NOTE — Telephone Encounter (Signed)
Pt request refill of the following:   insulin lispro (HUMALOG) 100 UNIT/ML injection      Pt insurance requires NOVOLOG instead of the above    Pt also need pen needles    Phamacy: [facility] at [address]

## 2015-07-18 ENCOUNTER — Telehealth: Payer: Self-pay | Admitting: Internal Medicine

## 2015-07-18 NOTE — Telephone Encounter (Signed)
[facility] called needing to have the Humalog switched over to Novalog due to insurance.  [id number] - , [address] AT [address] [place] [phone] (Phone) [phone] (Fax)

## 2015-07-19 DIAGNOSIS — L97821 Non-pressure chronic ulcer of other part of left lower leg limited to breakdown of skin: Secondary | ICD-10-CM | POA: Diagnosis not present

## 2015-07-19 DIAGNOSIS — E11621 Type 2 diabetes mellitus with foot ulcer: Secondary | ICD-10-CM | POA: Diagnosis not present

## 2015-07-19 DIAGNOSIS — I509 Heart failure, unspecified: Secondary | ICD-10-CM | POA: Diagnosis not present

## 2015-07-19 DIAGNOSIS — I110 Hypertensive heart disease with heart failure: Secondary | ICD-10-CM | POA: Diagnosis not present

## 2015-07-19 DIAGNOSIS — G4730 Sleep apnea, unspecified: Secondary | ICD-10-CM | POA: Diagnosis not present

## 2015-07-19 DIAGNOSIS — L97811 Non-pressure chronic ulcer of other part of right lower leg limited to breakdown of skin: Secondary | ICD-10-CM | POA: Diagnosis not present

## 2015-07-19 DIAGNOSIS — L97421 Non-pressure chronic ulcer of left heel and midfoot limited to breakdown of skin: Secondary | ICD-10-CM | POA: Diagnosis not present

## 2015-07-19 DIAGNOSIS — L97212 Non-pressure chronic ulcer of right calf with fat layer exposed: Secondary | ICD-10-CM | POA: Diagnosis not present

## 2015-07-19 DIAGNOSIS — L84 Corns and callosities: Secondary | ICD-10-CM | POA: Diagnosis not present

## 2015-07-19 DIAGNOSIS — L97522 Non-pressure chronic ulcer of other part of left foot with fat layer exposed: Secondary | ICD-10-CM | POA: Diagnosis not present

## 2015-07-19 DIAGNOSIS — I890 Lymphedema, not elsewhere classified: Secondary | ICD-10-CM | POA: Diagnosis not present

## 2015-07-19 DIAGNOSIS — I2510 Atherosclerotic heart disease of native coronary artery without angina pectoris: Secondary | ICD-10-CM | POA: Diagnosis not present

## 2015-07-19 MED ORDER — INSULIN ASPART 100 UNIT/ML FLEXPEN: PEN_INJECTOR | SUBCUTANEOUS | Status: DC

## 2015-07-19 NOTE — Telephone Encounter (Signed)
Okay per Dr Shawna Orleans.  Rx sent.

## 2015-07-20 NOTE — Telephone Encounter (Signed)
Ok for switch of insulin.  Same directions

## 2015-07-25 ENCOUNTER — Ambulatory Visit (INDEPENDENT_AMBULATORY_CARE_PROVIDER_SITE_OTHER): Payer: Commercial Managed Care - HMO | Admitting: Family Medicine

## 2015-07-25 ENCOUNTER — Encounter: Payer: Self-pay | Admitting: Family Medicine

## 2015-07-25 ENCOUNTER — Ambulatory Visit (HOSPITAL_BASED_OUTPATIENT_CLINIC_OR_DEPARTMENT_OTHER)
Admission: RE | Admit: 2015-07-25 | Discharge: 2015-07-25 | Disposition: A | Discharge status: Home / Self Care | Payer: Commercial Managed Care - HMO | Source: Ambulatory Visit | Attending: Internal Medicine | Admitting: Internal Medicine

## 2015-07-25 VITALS — BP 114/64 | HR 107 | Temp 97.6°F | Ht 74.0 in | Wt 200.8 lb

## 2015-07-25 VITALS — BP 118/80 | HR 100 | Wt 200.5 lb

## 2015-07-25 DIAGNOSIS — N183 Chronic kidney disease, stage 3 unspecified: Secondary | ICD-10-CM

## 2015-07-25 DIAGNOSIS — I252 Old myocardial infarction: Secondary | ICD-10-CM

## 2015-07-25 DIAGNOSIS — Z79899 Other long term (current) drug therapy: Secondary | ICD-10-CM | POA: Diagnosis not present

## 2015-07-25 DIAGNOSIS — Z8673 Personal history of transient ischemic attack (TIA), and cerebral infarction without residual deficits: Secondary | ICD-10-CM

## 2015-07-25 DIAGNOSIS — E8770 Fluid overload, unspecified: Secondary | ICD-10-CM | POA: Diagnosis present

## 2015-07-25 DIAGNOSIS — Z794 Long term (current) use of insulin: Secondary | ICD-10-CM | POA: Diagnosis not present

## 2015-07-25 DIAGNOSIS — E1165 Type 2 diabetes mellitus with hyperglycemia: Secondary | ICD-10-CM

## 2015-07-25 DIAGNOSIS — Z7982 Long term (current) use of aspirin: Secondary | ICD-10-CM | POA: Diagnosis not present

## 2015-07-25 DIAGNOSIS — I5042 Chronic combined systolic (congestive) and diastolic (congestive) heart failure: Secondary | ICD-10-CM

## 2015-07-25 DIAGNOSIS — N179 Acute kidney failure, unspecified: Secondary | ICD-10-CM | POA: Diagnosis not present

## 2015-07-25 DIAGNOSIS — G4730 Sleep apnea, unspecified: Secondary | ICD-10-CM

## 2015-07-25 DIAGNOSIS — R972 Elevated prostate specific antigen [PSA]: Secondary | ICD-10-CM | POA: Diagnosis present

## 2015-07-25 DIAGNOSIS — R338 Other retention of urine: Secondary | ICD-10-CM | POA: Diagnosis present

## 2015-07-25 DIAGNOSIS — I5023 Acute on chronic systolic (congestive) heart failure: Secondary | ICD-10-CM

## 2015-07-25 DIAGNOSIS — I83018 Varicose veins of right lower extremity with ulcer other part of lower leg: Secondary | ICD-10-CM | POA: Diagnosis present

## 2015-07-25 DIAGNOSIS — I69351 Hemiplegia and hemiparesis following cerebral infarction affecting right dominant side: Secondary | ICD-10-CM | POA: Diagnosis not present

## 2015-07-25 DIAGNOSIS — IMO0002 Reserved for concepts with insufficient information to code with codable children: Secondary | ICD-10-CM

## 2015-07-25 DIAGNOSIS — E1122 Type 2 diabetes mellitus with diabetic chronic kidney disease: Secondary | ICD-10-CM

## 2015-07-25 DIAGNOSIS — N189 Chronic kidney disease, unspecified: Secondary | ICD-10-CM | POA: Diagnosis not present

## 2015-07-25 DIAGNOSIS — I5043 Acute on chronic combined systolic (congestive) and diastolic (congestive) heart failure: Secondary | ICD-10-CM | POA: Diagnosis present

## 2015-07-25 DIAGNOSIS — I272 Other secondary pulmonary hypertension: Secondary | ICD-10-CM | POA: Diagnosis present

## 2015-07-25 DIAGNOSIS — M109 Gout, unspecified: Secondary | ICD-10-CM

## 2015-07-25 DIAGNOSIS — R159 Full incontinence of feces: Secondary | ICD-10-CM | POA: Diagnosis not present

## 2015-07-25 DIAGNOSIS — Z825 Family history of asthma and other chronic lower respiratory diseases: Secondary | ICD-10-CM | POA: Diagnosis not present

## 2015-07-25 DIAGNOSIS — I2510 Atherosclerotic heart disease of native coronary artery without angina pectoris: Secondary | ICD-10-CM | POA: Diagnosis present

## 2015-07-25 DIAGNOSIS — E1342 Other specified diabetes mellitus with diabetic polyneuropathy: Secondary | ICD-10-CM | POA: Diagnosis not present

## 2015-07-25 DIAGNOSIS — L97812 Non-pressure chronic ulcer of other part of right lower leg with fat layer exposed: Secondary | ICD-10-CM | POA: Diagnosis present

## 2015-07-25 DIAGNOSIS — E876 Hypokalemia: Secondary | ICD-10-CM | POA: Diagnosis not present

## 2015-07-25 DIAGNOSIS — N320 Bladder-neck obstruction: Secondary | ICD-10-CM | POA: Diagnosis present

## 2015-07-25 DIAGNOSIS — E11649 Type 2 diabetes mellitus with hypoglycemia without coma: Secondary | ICD-10-CM | POA: Diagnosis not present

## 2015-07-25 DIAGNOSIS — N1330 Unspecified hydronephrosis: Secondary | ICD-10-CM | POA: Diagnosis not present

## 2015-07-25 DIAGNOSIS — R0602 Shortness of breath: Secondary | ICD-10-CM | POA: Diagnosis not present

## 2015-07-25 DIAGNOSIS — E1140 Type 2 diabetes mellitus with diabetic neuropathy, unspecified: Secondary | ICD-10-CM

## 2015-07-25 DIAGNOSIS — Z801 Family history of malignant neoplasm of trachea, bronchus and lung: Secondary | ICD-10-CM | POA: Diagnosis not present

## 2015-07-25 DIAGNOSIS — E1142 Type 2 diabetes mellitus with diabetic polyneuropathy: Secondary | ICD-10-CM | POA: Insufficient documentation

## 2015-07-25 DIAGNOSIS — Z95810 Presence of automatic (implantable) cardiac defibrillator: Secondary | ICD-10-CM

## 2015-07-25 DIAGNOSIS — I255 Ischemic cardiomyopathy: Secondary | ICD-10-CM

## 2015-07-25 DIAGNOSIS — I130 Hypertensive heart and chronic kidney disease with heart failure and stage 1 through stage 4 chronic kidney disease, or unspecified chronic kidney disease: Secondary | ICD-10-CM | POA: Diagnosis present

## 2015-07-25 DIAGNOSIS — N1339 Other hydronephrosis: Secondary | ICD-10-CM | POA: Diagnosis present
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
## 2015-07-25 NOTE — Patient Instructions (Signed)
You will be admitted to the hospital tomorrow, they will call you when a room is ready, when they call you please come to the ADMITTING OFFICE, [place]

## 2015-07-25 NOTE — Progress Notes (Signed)
Advanced Heart Failure Medication Review by a Pharmacist  Does the patient  feel that his/her medications are working for him/her?  yes  Has the patient been experiencing any side effects to the medications prescribed?  no  Does the patient measure his/her own blood pressure or blood glucose at home?  yes   Does the patient have any problems obtaining medications due to transportation or finances?   no  Understanding of regimen: good Understanding of indications: good Potential of compliance: good Patient understands to avoid NSAIDs. Patient understands to avoid decongestants.  Issues to address at subsequent visits: None   Pharmacist comments: [name] [name] is a pleasant 70 yo M presenting with his daughter and wife and a medication list from his last general cardiology office visit. His daughter works for [employer] and fills his medications for him. She states that he has not had lisinopril, Imdur or Bystolic for months and was not sure if he was supposed to be on these or not. There is documentation of his lisinopril being discontinued after a hospital visit in December for syncope but all of his follow up appointments have Bystolic and Imdur in their list. Will relay this information to Dr. Haroldine Laws.  [name] [name]. [name] [name], PharmD, BCPS, CPP Clinical Pharmacist Pager: [phone] Phone: [phone] 07/25/2015 12:06 PM      Time with patient: 10 minutes Preparation and documentation time: 6 minutes Total time: 16 minutes
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
## 2015-07-26 ENCOUNTER — Encounter (HOSPITAL_BASED_OUTPATIENT_CLINIC_OR_DEPARTMENT_OTHER): Payer: Commercial Managed Care - HMO

## 2015-07-26 ENCOUNTER — Inpatient Hospital Stay (HOSPITAL_COMMUNITY)
Admission: RE | Admit: 2015-07-26 | Discharge: 2015-08-04 | DRG: 286 | Disposition: A | Discharge status: Home Health | Payer: Commercial Managed Care - HMO | Source: Ambulatory Visit | Attending: Internal Medicine | Admitting: Internal Medicine

## 2015-07-26 ENCOUNTER — Encounter (HOSPITAL_COMMUNITY): Payer: Self-pay

## 2015-07-26 DIAGNOSIS — Z7982 Long term (current) use of aspirin: Secondary | ICD-10-CM

## 2015-07-26 DIAGNOSIS — R159 Full incontinence of feces: Secondary | ICD-10-CM | POA: Diagnosis not present

## 2015-07-26 DIAGNOSIS — E1165 Type 2 diabetes mellitus with hyperglycemia: Secondary | ICD-10-CM | POA: Diagnosis present

## 2015-07-26 DIAGNOSIS — Z794 Long term (current) use of insulin: Secondary | ICD-10-CM

## 2015-07-26 DIAGNOSIS — N183 Chronic kidney disease, stage 3 (moderate): Secondary | ICD-10-CM | POA: Diagnosis present

## 2015-07-26 DIAGNOSIS — I2510 Atherosclerotic heart disease of native coronary artery without angina pectoris: Secondary | ICD-10-CM | POA: Diagnosis present

## 2015-07-26 DIAGNOSIS — E1122 Type 2 diabetes mellitus with diabetic chronic kidney disease: Secondary | ICD-10-CM | POA: Diagnosis present

## 2015-07-26 DIAGNOSIS — I255 Ischemic cardiomyopathy: Secondary | ICD-10-CM | POA: Diagnosis present

## 2015-07-26 DIAGNOSIS — Z801 Family history of malignant neoplasm of trachea, bronchus and lung: Secondary | ICD-10-CM | POA: Diagnosis not present

## 2015-07-26 DIAGNOSIS — N179 Acute kidney failure, unspecified: Secondary | ICD-10-CM

## 2015-07-26 DIAGNOSIS — I272 Other secondary pulmonary hypertension: Secondary | ICD-10-CM | POA: Diagnosis present

## 2015-07-26 DIAGNOSIS — I83018 Varicose veins of right lower extremity with ulcer other part of lower leg: Secondary | ICD-10-CM | POA: Diagnosis present

## 2015-07-26 DIAGNOSIS — I69351 Hemiplegia and hemiparesis following cerebral infarction affecting right dominant side: Secondary | ICD-10-CM

## 2015-07-26 DIAGNOSIS — Z825 Family history of asthma and other chronic lower respiratory diseases: Secondary | ICD-10-CM | POA: Diagnosis not present

## 2015-07-26 DIAGNOSIS — R338 Other retention of urine: Secondary | ICD-10-CM | POA: Diagnosis present

## 2015-07-26 DIAGNOSIS — I252 Old myocardial infarction: Secondary | ICD-10-CM | POA: Diagnosis not present

## 2015-07-26 DIAGNOSIS — N320 Bladder-neck obstruction: Secondary | ICD-10-CM | POA: Diagnosis present

## 2015-07-26 DIAGNOSIS — G4730 Sleep apnea, unspecified: Secondary | ICD-10-CM | POA: Diagnosis present

## 2015-07-26 DIAGNOSIS — L97812 Non-pressure chronic ulcer of other part of right lower leg with fat layer exposed: Secondary | ICD-10-CM | POA: Diagnosis present

## 2015-07-26 DIAGNOSIS — R972 Elevated prostate specific antigen [PSA]: Secondary | ICD-10-CM | POA: Diagnosis present

## 2015-07-26 DIAGNOSIS — Z79899 Other long term (current) drug therapy: Secondary | ICD-10-CM

## 2015-07-26 DIAGNOSIS — E1140 Type 2 diabetes mellitus with diabetic neuropathy, unspecified: Secondary | ICD-10-CM | POA: Diagnosis present

## 2015-07-26 DIAGNOSIS — M109 Gout, unspecified: Secondary | ICD-10-CM | POA: Diagnosis present

## 2015-07-26 DIAGNOSIS — E11649 Type 2 diabetes mellitus with hypoglycemia without coma: Secondary | ICD-10-CM | POA: Diagnosis not present

## 2015-07-26 DIAGNOSIS — Z95810 Presence of automatic (implantable) cardiac defibrillator: Secondary | ICD-10-CM

## 2015-07-26 DIAGNOSIS — N189 Chronic kidney disease, unspecified: Secondary | ICD-10-CM

## 2015-07-26 DIAGNOSIS — E8770 Fluid overload, unspecified: Secondary | ICD-10-CM | POA: Diagnosis present

## 2015-07-26 DIAGNOSIS — E876 Hypokalemia: Secondary | ICD-10-CM | POA: Diagnosis not present

## 2015-07-26 DIAGNOSIS — N1339 Other hydronephrosis: Secondary | ICD-10-CM | POA: Diagnosis present

## 2015-07-26 DIAGNOSIS — I5043 Acute on chronic combined systolic (congestive) and diastolic (congestive) heart failure: Secondary | ICD-10-CM | POA: Diagnosis present

## 2015-07-26 DIAGNOSIS — R0600 Dyspnea, unspecified: Secondary | ICD-10-CM

## 2015-07-26 DIAGNOSIS — I130 Hypertensive heart and chronic kidney disease with heart failure and stage 1 through stage 4 chronic kidney disease, or unspecified chronic kidney disease: Secondary | ICD-10-CM | POA: Diagnosis present

## 2015-07-26 LAB — MAGNESIUM: MAGNESIUM: 2 mg/dL (ref 1.7–2.4)

## 2015-07-26 LAB — COMPREHENSIVE METABOLIC PANEL
ALBUMIN: 2.8 g/dL — AB (ref 3.5–5.0)
ALT: 11 U/L — AB (ref 17–63)
AST: 18 U/L (ref 15–41)
Alkaline Phosphatase: 164 U/L — ABNORMAL HIGH (ref 38–126)
Anion gap: 12 (ref 5–15)
BUN: 19 mg/dL (ref 6–20)
CHLORIDE: 104 mmol/L (ref 101–111)
CO2: 26 mmol/L (ref 22–32)
CREATININE: 1.7 mg/dL — AB (ref 0.61–1.24)
Calcium: 8.6 mg/dL — ABNORMAL LOW (ref 8.9–10.3)
GFR calc Af Amer: 46 mL/min — ABNORMAL LOW (ref >60)
GFR, EST NON AFRICAN AMERICAN: 39 mL/min — AB (ref >60)
Glucose, Bld: 133 mg/dL — ABNORMAL HIGH (ref 65–99)
POTASSIUM: 3.9 mmol/L (ref 3.5–5.1)
SODIUM: 142 mmol/L (ref 135–145)
Total Bilirubin: 1.4 mg/dL — ABNORMAL HIGH (ref 0.3–1.2)
Total Protein: 6.7 g/dL (ref 6.5–8.1)

## 2015-07-26 LAB — CBC WITH DIFFERENTIAL/PLATELET
BASOS PCT: 1 %
Basophils Absolute: 0.1 10*3/uL (ref 0.0–0.1)
EOS ABS: 0.1 10*3/uL (ref 0.0–0.7)
EOS PCT: 1 %
HCT: 40.6 % (ref 39.0–52.0)
Hemoglobin: 12.9 g/dL — ABNORMAL LOW (ref 13.0–17.0)
Lymphocytes Relative: 8 %
Lymphs Abs: 0.8 10*3/uL (ref 0.7–4.0)
MCH: 27.1 pg (ref 26.0–34.0)
MCHC: 31.8 g/dL (ref 30.0–36.0)
MCV: 85.3 fL (ref 78.0–100.0)
MONO ABS: 1 10*3/uL (ref 0.1–1.0)
Monocytes Relative: 9 %
Neutro Abs: 8.3 10*3/uL — ABNORMAL HIGH (ref 1.7–7.7)
Neutrophils Relative %: 81 %
PLATELETS: 334 10*3/uL (ref 150–400)
RBC: 4.76 MIL/uL (ref 4.22–5.81)
RDW: 14.6 % (ref 11.5–15.5)
WBC: 10.2 10*3/uL (ref 4.0–10.5)

## 2015-07-26 LAB — GLUCOSE, CAPILLARY
GLUCOSE-CAPILLARY: 111 mg/dL — AB (ref 65–99)
GLUCOSE-CAPILLARY: 145 mg/dL — AB (ref 65–99)

## 2015-07-26 LAB — BRAIN NATRIURETIC PEPTIDE: B Natriuretic Peptide: 2061.3 pg/mL — ABNORMAL HIGH (ref 0.0–100.0)

## 2015-07-26 LAB — HEMOGLOBIN A1C: Hgb A1c MFr Bld: 9.2 % — ABNORMAL HIGH (ref 4.6–6.5)

## 2015-07-26 LAB — TSH: TSH: 1.785 u[IU]/mL (ref 0.350–4.500)

## 2015-07-26 MED ORDER — ONDANSETRON HCL 4 MG/2ML IJ SOLN: 4.0000 mg | Freq: Four times a day (QID) | INTRAMUSCULAR | Status: DC | PRN

## 2015-07-26 MED ORDER — ADULT MULTIVITAMIN W/MINERALS CH
1.0000 | ORAL_TABLET | Freq: Every day | ORAL | Status: DC
  Administered 2015-07-26 – 2015-08-04 (×10): 1 via ORAL
  Filled 2015-07-26 (×10): qty 1

## 2015-07-26 MED ORDER — ACETAMINOPHEN 325 MG PO TABS
650.0000 mg | ORAL_TABLET | ORAL | Status: DC | PRN
  Administered 2015-07-28: 650 mg via ORAL
  Filled 2015-07-26: qty 2

## 2015-07-26 MED ORDER — ENOXAPARIN SODIUM 40 MG/0.4ML ~~LOC~~ SOLN
40.0000 mg | SUBCUTANEOUS | Status: DC
  Administered 2015-07-26 – 2015-07-31 (×6): 40 mg via SUBCUTANEOUS
  Filled 2015-07-26 (×6): qty 0.4

## 2015-07-26 MED ORDER — INSULIN GLARGINE 100 UNIT/ML ~~LOC~~ SOLN
75.0000 [IU] | Freq: Every day | SUBCUTANEOUS | Status: DC
  Administered 2015-07-26: 75 [IU] via SUBCUTANEOUS
  Filled 2015-07-26 (×2): qty 0.75

## 2015-07-26 MED ORDER — IVABRADINE HCL 5 MG PO TABS
2.5000 mg | ORAL_TABLET | Freq: Two times a day (BID) | ORAL | Status: DC
  Administered 2015-07-26: 2.5 mg via ORAL
  Filled 2015-07-26 (×2): qty 1

## 2015-07-26 MED ORDER — FUROSEMIDE 10 MG/ML IJ SOLN
80.0000 mg | Freq: Two times a day (BID) | INTRAMUSCULAR | Status: DC
  Administered 2015-07-26 – 2015-07-30 (×8): 80 mg via INTRAVENOUS
  Filled 2015-07-26 (×8): qty 8

## 2015-07-26 MED ORDER — INSULIN ASPART 100 UNIT/ML ~~LOC~~ SOLN: 0.0000 [IU] | Freq: Three times a day (TID) | SUBCUTANEOUS | Status: DC

## 2015-07-27 ENCOUNTER — Telehealth (HOSPITAL_COMMUNITY): Payer: Self-pay | Admitting: Pharmacist

## 2015-07-27 ENCOUNTER — Inpatient Hospital Stay (HOSPITAL_COMMUNITY): Payer: Commercial Managed Care - HMO

## 2015-07-27 DIAGNOSIS — I5043 Acute on chronic combined systolic (congestive) and diastolic (congestive) heart failure: Secondary | ICD-10-CM

## 2015-07-27 DIAGNOSIS — N183 Chronic kidney disease, stage 3 (moderate): Secondary | ICD-10-CM

## 2015-07-27 LAB — GLUCOSE, CAPILLARY
GLUCOSE-CAPILLARY: 118 mg/dL — AB (ref 65–99)
Glucose-Capillary: 46 mg/dL — ABNORMAL LOW (ref 65–99)
Glucose-Capillary: 80 mg/dL (ref 65–99)
Glucose-Capillary: 120 mg/dL — ABNORMAL HIGH (ref 65–99)
Glucose-Capillary: 95 mg/dL (ref 65–99)

## 2015-07-27 LAB — HEMOGLOBIN A1C
HEMOGLOBIN A1C: 9.1 % — AB (ref 4.8–5.6)
MEAN PLASMA GLUCOSE: 214 mg/dL

## 2015-07-27 LAB — CARBOXYHEMOGLOBIN
CARBOXYHEMOGLOBIN: 1.1 % (ref 0.5–1.5)
METHEMOGLOBIN: 0.7 % (ref 0.0–1.5)
O2 SAT: 64.5 %
TOTAL HEMOGLOBIN: 11.9 g/dL — AB (ref 13.5–18.0)

## 2015-07-27 LAB — BASIC METABOLIC PANEL
Anion gap: 12 (ref 5–15)
BUN: 20 mg/dL (ref 6–20)
CALCIUM: 8.7 mg/dL — AB (ref 8.9–10.3)
CO2: 28 mmol/L (ref 22–32)
CREATININE: 1.7 mg/dL — AB (ref 0.61–1.24)
Chloride: 106 mmol/L (ref 101–111)
GFR calc Af Amer: 46 mL/min — ABNORMAL LOW (ref >60)
GFR, EST NON AFRICAN AMERICAN: 39 mL/min — AB (ref >60)
Glucose, Bld: 62 mg/dL — ABNORMAL LOW (ref 65–99)
Potassium: 3.7 mmol/L (ref 3.5–5.1)
SODIUM: 146 mmol/L — AB (ref 135–145)

## 2015-07-27 MED ORDER — HYDRALAZINE HCL 10 MG PO TABS
10.0000 mg | ORAL_TABLET | Freq: Three times a day (TID) | ORAL | Status: DC
  Administered 2015-07-27 – 2015-08-03 (×23): 10 mg via ORAL
  Filled 2015-07-27 (×23): qty 1

## 2015-07-27 MED ORDER — DIGOXIN 125 MCG PO TABS
0.1250 mg | ORAL_TABLET | Freq: Every day | ORAL | Status: DC
  Administered 2015-07-28 – 2015-08-04 (×8): 0.125 mg via ORAL
  Filled 2015-07-27 (×8): qty 1

## 2015-07-27 MED ORDER — INSULIN GLARGINE 100 UNIT/ML ~~LOC~~ SOLN
50.0000 [IU] | Freq: Every day | SUBCUTANEOUS | Status: DC
  Administered 2015-07-27: 50 [IU] via SUBCUTANEOUS
  Filled 2015-07-27 (×2): qty 0.5

## 2015-07-27 MED ORDER — ASPIRIN EC 81 MG PO TBEC
81.0000 mg | DELAYED_RELEASE_TABLET | Freq: Every day | ORAL | Status: DC
  Administered 2015-07-27 – 2015-08-04 (×8): 81 mg via ORAL
  Filled 2015-07-27 (×8): qty 1

## 2015-07-27 MED ORDER — SODIUM CHLORIDE 0.9% FLUSH
10.0000 mL | Freq: Two times a day (BID) | INTRAVENOUS | Status: DC
  Administered 2015-07-27 – 2015-08-02 (×10): 10 mL
  Administered 2015-08-03: 20 mL
  Administered 2015-08-03 – 2015-08-04 (×2): 10 mL

## 2015-07-27 MED ORDER — ISOSORBIDE MONONITRATE ER 30 MG PO TB24
30.0000 mg | ORAL_TABLET | Freq: Every day | ORAL | Status: DC
  Administered 2015-07-27 – 2015-08-04 (×9): 30 mg via ORAL
  Filled 2015-07-27 (×9): qty 1

## 2015-07-27 MED ORDER — SODIUM CHLORIDE 0.9% FLUSH: 10.0000 mL | INTRAVENOUS | Status: DC | PRN

## 2015-07-27 MED ORDER — ROSUVASTATIN CALCIUM 20 MG PO TABS
40.0000 mg | ORAL_TABLET | Freq: Every day | ORAL | Status: DC
  Administered 2015-07-27 – 2015-08-03 (×8): 40 mg via ORAL
  Filled 2015-07-27 (×8): qty 2

## 2015-07-27 NOTE — Consult Note (Addendum)
WOC wound consult note Reason for Consult: Consult requested for BLE.  Pt states he has been followed by the outpatient wound care center prior to admission and he will miss his appointment while he is in the hospital. Wound type: Left plantar foot with dry callous; 1X1cm; pt states no further topical treatment is being performed to this site.  No open wound, odor, drainage, or fluctuance. Right leg with full thickness stasis ulcer; .5X.5X.2cm, 100% yellow, mod amt yellow drainage, no odor.  Dressing procedure/placement/frequency: Pt states he was using " a silver dressing" prior to admission; will continue this plan of care.  Aquacel to absorb drainage and provide antibacterial benefits, foam dressing to protect from further injury.  Pt can resume follow-up with the outpatient wound care center after discharge.  Discussed plan of care and he denies further questions. Please re-consult if further assistance is needed.  Thank-you,  Julien Girt MSN, [place], [place], [place], [place]
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
## 2015-07-27 NOTE — Progress Notes (Signed)
cbg-46mg /dl, asymptomatic, consumed 100% breakfast, repeat cbg-80mg /dl, NP made aware with order, continue to monitor.

## 2015-07-27 NOTE — Telephone Encounter (Signed)
Patient's daughter called asking for assistance with Mr. Zahler HF medication costs. I have enrolled him in the [employer] foundation which will provide $1500 toward HF medication copay costs. Will relay this information to his pharmacy once discharged from hospital.   Billing ID: [id number] Person Code: [place]: [id number] RX BIN: [id number] PCN for Part D: [id number]   Ruta Hinds. Velva Harman, PharmD, BCPS, CPP Clinical Pharmacist Pager: [phone] Phone: [phone] 07/27/2015 2:01 PM

## 2015-07-27 NOTE — Progress Notes (Signed)
Inpatient Diabetes Program Recommendations  AACE/ADA: New Consensus Statement on Inpatient Glycemic Control (2015)  Target Ranges:  Prepandial:   less than 140 mg/dL      Peak postprandial:   less than 180 mg/dL (1-2 hours)      Critically ill patients:  140 - 180 mg/dL   Review of Glycemic Control  Diabetes history: DM2 Outpatient Diabetes medications: Lantus 75 units QHS, Novolog 5-13 units tidwc Current orders for Inpatient glycemic control: Lantus 75 units QHS, Novolog sensitive tidwc  Results for KAMARI, BUCH (MRN [id number]) as of 07/27/2015 11:44  Ref. Range 07/26/2015 17:32 07/26/2015 21:17 07/27/2015 09:00 07/27/2015 09:26  Glucose-Capillary Latest Ref Range: 65-99 mg/dL 111 (H) 145 (H) 46 (L) 80  Results for JODECI, ROARTY (MRN [id number]) as of 07/27/2015 11:44  Ref. Range 07/26/2015 17:07  Hemoglobin A1C Latest Ref Range: 4.8-5.6 % 9.1 (H)  Results for ARTRELL, LAWLESS (MRN [id number]) as of 07/27/2015 11:44  Ref. Range 07/27/2015 02:09  Sodium Latest Ref Range: 135-145 mmol/L 146 (H)  Potassium Latest Ref Range: 3.5-5.1 mmol/L 3.7  Chloride Latest Ref Range: 101-111 mmol/L 106  CO2 Latest Ref Range: 22-32 mmol/L 28  BUN Latest Ref Range: 6-20 mg/dL 20  Creatinine Latest Ref Range: 0.61-1.24 mg/dL 1.70 (H)  Calcium Latest Ref Range: 8.9-10.3 mg/dL 8.7 (L)  EGFR (Non-African Amer.) Latest Ref Range: >60 mL/min 39 (L)  EGFR (African American) Latest Ref Range: >60 mL/min 46 (L)  Glucose Latest Ref Range: 65-99 mg/dL 62 (L)  Anion gap Latest Ref Range: 5-15  12   Hypoglycemia this am. Diagnosed with DM2 in 2005. Needs reduced dose of basal insulin. HgbA1C is elevated - needs tighter control at home.  Sees [name] in [facility] as his PCP.  Inpatient Diabetes Program Recommendations:    Decrease Lantus to 50 units QHS Add HS correction. Add Novolog 3 units tidwc for meal coverage insulin. Add CHO mod med to 2 gram sodium diet. F/U with PCP after discharge for needed insulin  adjustments.  Will follow. Thank you. Lorenda Peck, RD, LDN, CDE Inpatient Diabetes Coordinator [phone]

## 2015-07-27 NOTE — Progress Notes (Signed)
Peripherally Inserted Central Catheter/Midline Placement  The IV Nurse has discussed with the patient and/or persons authorized to consent for the patient, the purpose of this procedure and the potential benefits and risks involved with this procedure.  The benefits include less needle sticks, lab draws from the catheter and patient may be discharged home with the catheter.  Risks include, but not limited to, infection, bleeding, blood clot (thrombus formation), and puncture of an artery; nerve damage and irregular heat beat.  Alternatives to this procedure were also discussed.  PICC/Midline Placement Documentation        [name] [name] 07/27/2015, 3:27 PM

## 2015-07-28 LAB — CBC
HCT: 35.9 % — ABNORMAL LOW (ref 39.0–52.0)
HEMOGLOBIN: 11.3 g/dL — AB (ref 13.0–17.0)
MCH: 26.4 pg (ref 26.0–34.0)
MCHC: 31.5 g/dL (ref 30.0–36.0)
MCV: 83.9 fL (ref 78.0–100.0)
Platelets: 350 10*3/uL (ref 150–400)
RBC: 4.28 MIL/uL (ref 4.22–5.81)
RDW: 14.7 % (ref 11.5–15.5)
WBC: 12.8 10*3/uL — ABNORMAL HIGH (ref 4.0–10.5)

## 2015-07-28 LAB — COMPREHENSIVE METABOLIC PANEL
ALT: 10 U/L — ABNORMAL LOW (ref 17–63)
ANION GAP: 11 (ref 5–15)
AST: 18 U/L (ref 15–41)
Albumin: 2.4 g/dL — ABNORMAL LOW (ref 3.5–5.0)
Alkaline Phosphatase: 142 U/L — ABNORMAL HIGH (ref 38–126)
BUN: 23 mg/dL — ABNORMAL HIGH (ref 6–20)
CHLORIDE: 103 mmol/L (ref 101–111)
CO2: 25 mmol/L (ref 22–32)
Calcium: 8.2 mg/dL — ABNORMAL LOW (ref 8.9–10.3)
Creatinine, Ser: 1.77 mg/dL — ABNORMAL HIGH (ref 0.61–1.24)
GFR calc non Af Amer: 37 mL/min — ABNORMAL LOW (ref >60)
GFR, EST AFRICAN AMERICAN: 43 mL/min — AB (ref >60)
Glucose, Bld: 40 mg/dL — CL (ref 65–99)
POTASSIUM: 3 mmol/L — AB (ref 3.5–5.1)
SODIUM: 139 mmol/L (ref 135–145)
Total Bilirubin: 1.1 mg/dL (ref 0.3–1.2)
Total Protein: 6 g/dL — ABNORMAL LOW (ref 6.5–8.1)

## 2015-07-28 LAB — BASIC METABOLIC PANEL
ANION GAP: 11 (ref 5–15)
BUN: 25 mg/dL — ABNORMAL HIGH (ref 6–20)
CALCIUM: 8.3 mg/dL — AB (ref 8.9–10.3)
CO2: 26 mmol/L (ref 22–32)
Chloride: 103 mmol/L (ref 101–111)
Creatinine, Ser: 1.71 mg/dL — ABNORMAL HIGH (ref 0.61–1.24)
GFR calc Af Amer: 45 mL/min — ABNORMAL LOW (ref >60)
GFR calc non Af Amer: 39 mL/min — ABNORMAL LOW (ref >60)
GLUCOSE: 107 mg/dL — AB (ref 65–99)
Potassium: 4.1 mmol/L (ref 3.5–5.1)
Sodium: 140 mmol/L (ref 135–145)

## 2015-07-28 LAB — CARBOXYHEMOGLOBIN
Carboxyhemoglobin: 1.1 % (ref 0.5–1.5)
METHEMOGLOBIN: 0.9 % (ref 0.0–1.5)
O2 Saturation: 72.3 %
TOTAL HEMOGLOBIN: 11.8 g/dL — AB (ref 13.5–18.0)

## 2015-07-28 LAB — GLUCOSE, CAPILLARY
GLUCOSE-CAPILLARY: 33 mg/dL — AB (ref 65–99)
GLUCOSE-CAPILLARY: 88 mg/dL (ref 65–99)
GLUCOSE-CAPILLARY: 66 mg/dL (ref 65–99)
Glucose-Capillary: 132 mg/dL — ABNORMAL HIGH (ref 65–99)
Glucose-Capillary: 106 mg/dL — ABNORMAL HIGH (ref 65–99)
Glucose-Capillary: 68 mg/dL (ref 65–99)
Glucose-Capillary: 90 mg/dL (ref 65–99)

## 2015-07-28 MED ORDER — INSULIN GLARGINE 100 UNIT/ML ~~LOC~~ SOLN: 25.0000 [IU] | Freq: Every day | SUBCUTANEOUS | Status: DC

## 2015-07-28 MED ORDER — GLUCOSE 4 G PO CHEW
3.0000 | CHEWABLE_TABLET | Freq: Once | ORAL | Status: AC
  Administered 2015-07-28: 4 g via ORAL
  Filled 2015-07-28: qty 3

## 2015-07-28 MED ORDER — POTASSIUM CHLORIDE CRYS ER 20 MEQ PO TBCR
40.0000 meq | EXTENDED_RELEASE_TABLET | Freq: Two times a day (BID) | ORAL | Status: AC
  Administered 2015-07-28 (×2): 40 meq via ORAL
  Filled 2015-07-28 (×2): qty 2

## 2015-07-28 MED ORDER — METOLAZONE 2.5 MG PO TABS
2.5000 mg | ORAL_TABLET | Freq: Every day | ORAL | Status: DC
  Administered 2015-07-29 – 2015-07-30 (×2): 2.5 mg via ORAL
  Filled 2015-07-28 (×2): qty 1

## 2015-07-28 MED ORDER — POTASSIUM CHLORIDE CRYS ER 20 MEQ PO TBCR
40.0000 meq | EXTENDED_RELEASE_TABLET | ORAL | Status: AC
  Administered 2015-07-28: 40 meq via ORAL

## 2015-07-28 MED ORDER — SPIRONOLACTONE 25 MG PO TABS
12.5000 mg | ORAL_TABLET | Freq: Every day | ORAL | Status: DC
  Administered 2015-07-28 – 2015-08-04 (×8): 12.5 mg via ORAL
  Filled 2015-07-28 (×8): qty 1

## 2015-07-28 MED ORDER — POTASSIUM CHLORIDE CRYS ER 20 MEQ PO TBCR
40.0000 meq | EXTENDED_RELEASE_TABLET | Freq: Two times a day (BID) | ORAL | Status: DC
  Filled 2015-07-28: qty 2

## 2015-07-28 MED ORDER — DEXTROSE 50 % IV SOLN
INTRAVENOUS | Status: AC
  Administered 2015-07-28: 50 mL
  Filled 2015-07-28: qty 50

## 2015-07-28 MED ORDER — METOLAZONE 2.5 MG PO TABS
2.5000 mg | ORAL_TABLET | Freq: Once | ORAL | Status: AC
  Administered 2015-07-28: 2.5 mg via ORAL
  Filled 2015-07-28: qty 1

## 2015-07-28 NOTE — Progress Notes (Signed)
K 3.0 repleted.  Per nursing report, patient had significant hypoglycemic event overnight, has been given D50, last blood glucose 130. Mental status back to normal. Morning team to reevaluate night time lantus dose as patient does not appear to eat a lot of dinner and nighttime hypoglycemic event would be likely.  Hilbert Corrigan PA Pager: [phone]

## 2015-07-28 NOTE — Progress Notes (Signed)
Advanced Heart Failure Rounding Note   Subjective:    Yesterday diuresed with IV lasix and started on low dose hydralazine/imdur. Weight down a pound. PICC placed. Todays CO-OX 72%. Hypoglycemic over night.   Overall feeling a little better. Ongoing dyspnea with exertion.    Objective:   Weight Range:  Vital Signs:   Temp:  [97.5 F (36.4 C)-98.6 F (37 C)] 97.6 F (36.4 C) (04/06 0747) Pulse Rate:  [77-108] 85 (04/06 0800) Resp:  [16-28] 25 (04/06 0800) BP: (102-128)/(74-83) 107/80 mmHg (04/06 0800) SpO2:  [94 %-100 %] 99 % (04/06 0800) Weight:  [193 lb 6.4 oz (87.726 kg)] 193 lb 6.4 oz (87.726 kg) (04/06 0425)    Weight change: Filed Weights   07/26/15 1653 07/27/15 0347 07/28/15 0425  Weight: 196 lb 13.9 oz (89.3 kg) 194 lb 0.1 oz (88 kg) 193 lb 6.4 oz (87.726 kg)    Intake/Output:   Intake/Output Summary (Last 24 hours) at 07/28/15 0836 Last data filed at 07/28/15 0600  Gross per 24 hour  Intake    790 ml  Output   1700 ml  Net   -910 ml     Physical Exam:CVP 13-14  General:  Well appearing. No resp difficulty.Sitting on the side of the bed HEENT: normal Neck: supple. JVP to jaw. . Carotids 2+ bilat; no bruits. No lymphadenopathy or thryomegaly appreciated. Cor: PMI nondisplaced. Regular rate & rhythm. No rubs, gallops or murmurs. Lungs: clear Abdomen: soft, nontender, nondistended. No hepatosplenomegaly. No bruits or masses. Good bowel sounds. Extremities: no cyanosis, clubbing, rash, edema RUE PICC RLE dressing.  Neuro: alert & orientedx3, cranial nerves grossly intact. moves all 4 extremities w/o difficulty. Affect pleasant  Telemetry: Sinus Rhythm Labs: Basic Metabolic Panel:  Recent Labs Lab 07/26/15 1707 07/27/15 0209 07/28/15 0445  NA 142 146* 139  K 3.9 3.7 3.0*  CL 104 106 103  CO2 26 28 25   GLUCOSE 133* 62* 40*  BUN 19 20 23*  CREATININE 1.70* 1.70* 1.77*  CALCIUM 8.6* 8.7* 8.2*  MG 2.0  --   --     Liver Function  Tests:  Recent Labs Lab 07/26/15 1707 07/28/15 0445  AST 18 18  ALT 11* 10*  ALKPHOS 164* 142*  BILITOT 1.4* 1.1  PROT 6.7 6.0*  ALBUMIN 2.8* 2.4*   No results for input(s): LIPASE, AMYLASE in the last 168 hours. No results for input(s): AMMONIA in the last 168 hours.  CBC:  Recent Labs Lab 07/26/15 1707 07/28/15 0445  WBC 10.2 12.8*  NEUTROABS 8.3*  --   HGB 12.9* 11.3*  HCT 40.6 35.9*  MCV 85.3 83.9  PLT 334 350    Cardiac Enzymes: No results for input(s): CKTOTAL, CKMB, CKMBINDEX, TROPONINI in the last 168 hours.  BNP: BNP (last 3 results)  Recent Labs  04/10/15 1613 04/11/15 0400 07/26/15 1707  BNP 2401.8* 2568.2* 2061.3*    ProBNP (last 3 results) No results for input(s): PROBNP in the last 8760 hours.    Other results:  Imaging: Dg Chest 2 View  07/27/2015  CLINICAL DATA:  Shortness of breath, weakness EXAM: CHEST  2 VIEW COMPARISON:  04/09/2012 FINDINGS: Cardiomegaly again noted. Single lead cardiac pacemaker is unchanged in position. No acute infiltrate or pulmonary edema. Bony thorax is stable. Mild basilar atelectasis. IMPRESSION: No active disease. Cardiomegaly again noted. Stable single lead cardiac pacemaker position. Electronically Signed   By: Lahoma Crocker M.D.   On: 07/27/2015 08:26      Medications:  Scheduled Medications: . aspirin EC  81 mg Oral Daily  . digoxin  0.125 mg Oral Daily  . enoxaparin (LOVENOX) injection  40 mg Subcutaneous Q24H  . furosemide  80 mg Intravenous BID  . hydrALAZINE  10 mg Oral 3 times per day  . insulin aspart  0-20 Units Subcutaneous TID WC  . insulin glargine  50 Units Subcutaneous QHS  . isosorbide mononitrate  30 mg Oral Daily  . multivitamin with minerals  1 tablet Oral Daily  . potassium chloride  40 mEq Oral BID  . rosuvastatin  40 mg Oral q1800  . sodium chloride flush  10-40 mL Intracatheter Q12H     Infusions:     PRN Medications:  acetaminophen, ondansetron (ZOFRAN) IV, sodium  chloride flush   Assessment/Plan/Discussion    1. Acute on chronic systolic CHF: Ischemic cardiomyopathy. EF 15% on last echo from 12/16. Long history of low EF. Patient has dyspnea with even mild exertion and is markedly volume overloaded on exam. BP is stable. Has St Jude ICD.  - CVP 14. Continue Lasix 80 mg IV bid and add 2.5 mg metolazone daily . Add ted hose.   - Hydralazine 10 tid + Imdur 30 daily.  - On digoxin, add spironolactone 12.5 daily.  - Has been intolerant of beta blockers in the past.  - Holding off on ACEI/ARB for now with elevated creatinine and need for diuresis.  - Will need eventual RHC.  - May be nearing need for advanced therapies.  - Interrogate St Jude ICD- No VT. Impedance down in March.  2. CAD: No chest pain. Nonviable LAD territory on cardiac MRI in 2007. Last cath in 8/15 with diffuse severe disease, was medically managed. Suspect unlikely to benefit from revascularization given history and lack of angina.  - Continue ASA and statin.  3. CKD: Stage III. Follow carefully with diuresis.  4. DM- Hypoglycemic over night. Lantus was cut back yesterday from 75>50 units. Stop Lantus and continue sliding scale.   5. RLE wound- WOC appreciated.  6. Hypokalemia- Replacing K.   Consult cardiac rehab.   Length of Stay: 2  Amy Clegg NP-C  07/28/2015, 8:36 AM   Advanced Heart Failure Team Pager [phone] (M-F; [place])  Please contact [facility] for night-coverage after hours (4p -7a ) and weekends on [email or web address]  Patient seen with NP, agree with the above note.  He is still volume overloaded on exam.  Co-ox good at 72%.   - Continue digoxin, hydralazine/nitrates. - Can add spironolactone 12.5 daily.  - Continue Lasix 80 mg IV bid, add metolazone 2.5 daily. Replace K.  - Eventual RHC when diuresed.  Creatinine stable so far, follow carefully with CKD.   Loralie Champagne 07/28/2015 10:13 AM

## 2015-07-28 NOTE — Progress Notes (Signed)
Lab called with critically low blood sugar of 43.  Pt. Given orange juice and gram crackers.  Pt. Seemed confused and wouldn't eat, 25 mg of D-50 was given per protocol.  Confusion resolved.  Rechecked blood sugar 15 min later and BG was 132.  Pt. Potassium was 3.0.  MD made aware.  Will continue to monitor closely.

## 2015-07-28 NOTE — Progress Notes (Signed)
Patients 2200 CBG 66, peanut butter and graham crackers given with orange juice. Repeat CBG was 63. Patient is asymptomatic. Glucose 3 tablets given per hypoglycemia protocol for patient that is on fluid restriction for treatment of heart failure. Will continue to monitor.

## 2015-07-28 NOTE — Progress Notes (Signed)
Inpatient Diabetes Program Recommendations  AACE/ADA: New Consensus Statement on Inpatient Glycemic Control (2015)  Target Ranges:  Prepandial:   less than 140 mg/dL      Peak postprandial:   less than 180 mg/dL (1-2 hours)      Critically ill patients:  140 - 180 mg/dL  Results for [name] [name], [name] [name] (MRN [id number]) as of 07/28/2015 09:02  Ref. Range 07/27/2015 09:00 07/27/2015 09:26 07/27/2015 11:43 07/27/2015 16:51 07/27/2015 21:23 07/28/2015 06:11 07/28/2015 06:26 07/28/2015 07:49  Glucose-Capillary Latest Ref Range: 65-99 mg/dL 46 (L) 80 118 (H) 120 (H) 95 33 (LL) 132 (H) 88   Review of Glycemic Control  Current orders for Inpatient glycemic control: Novolog 0-20 units TID with meals  Inpatient Diabetes Program Recommendations: Insulin - Basal: Noted Lantus 50 units was given last night and patient's glucose 33 mg/dl at 6:11 this morning. Please consider ordering Lantus 18 units QHS (based on 87.7 kg x 0.2 units).  Thanks, Barnie Alderman, RN, MSN, CDE Diabetes Coordinator Inpatient Diabetes Program [phone] (Team Pager from [place] to [place]) [phone] ([facility] office) [phone] [facility] office) [phone] [facility] office)

## 2015-07-29 ENCOUNTER — Other Ambulatory Visit: Payer: Self-pay

## 2015-07-29 DIAGNOSIS — N179 Acute kidney failure, unspecified: Secondary | ICD-10-CM

## 2015-07-29 LAB — CBC
HCT: 36.2 % — ABNORMAL LOW (ref 39.0–52.0)
HEMOGLOBIN: 11.6 g/dL — AB (ref 13.0–17.0)
MCH: 27 pg (ref 26.0–34.0)
MCHC: 32 g/dL (ref 30.0–36.0)
MCV: 84.4 fL (ref 78.0–100.0)
Platelets: 288 10*3/uL (ref 150–400)
RBC: 4.29 MIL/uL (ref 4.22–5.81)
RDW: 14.8 % (ref 11.5–15.5)
WBC: 10.9 10*3/uL — AB (ref 4.0–10.5)

## 2015-07-29 LAB — GLUCOSE, CAPILLARY
GLUCOSE-CAPILLARY: 222 mg/dL — AB (ref 65–99)
GLUCOSE-CAPILLARY: 99 mg/dL (ref 65–99)
Glucose-Capillary: 63 mg/dL — ABNORMAL LOW (ref 65–99)
Glucose-Capillary: 61 mg/dL — ABNORMAL LOW (ref 65–99)
Glucose-Capillary: 114 mg/dL — ABNORMAL HIGH (ref 65–99)
Glucose-Capillary: 167 mg/dL — ABNORMAL HIGH (ref 65–99)

## 2015-07-29 LAB — BASIC METABOLIC PANEL
Anion gap: 12 (ref 5–15)
BUN: 31 mg/dL — ABNORMAL HIGH (ref 6–20)
CALCIUM: 8.1 mg/dL — AB (ref 8.9–10.3)
CO2: 26 mmol/L (ref 22–32)
CREATININE: 1.89 mg/dL — AB (ref 0.61–1.24)
Chloride: 102 mmol/L (ref 101–111)
GFR calc non Af Amer: 35 mL/min — ABNORMAL LOW (ref >60)
GFR, EST AFRICAN AMERICAN: 40 mL/min — AB (ref >60)
Glucose, Bld: 73 mg/dL (ref 65–99)
Potassium: 4.6 mmol/L (ref 3.5–5.1)
Sodium: 140 mmol/L (ref 135–145)

## 2015-07-29 LAB — CARBOXYHEMOGLOBIN
CARBOXYHEMOGLOBIN: 0.9 % (ref 0.5–1.5)
METHEMOGLOBIN: 0.8 % (ref 0.0–1.5)
O2 Saturation: 61 %
Total hemoglobin: 11.2 g/dL — ABNORMAL LOW (ref 13.5–18.0)

## 2015-07-29 MED ORDER — MILRINONE IN DEXTROSE 20 MG/100ML IV SOLN
0.1250 ug/kg/min | INTRAVENOUS | Status: DC
  Administered 2015-07-29 – 2015-07-31 (×3): 0.125 ug/kg/min via INTRAVENOUS
  Filled 2015-07-29 (×3): qty 100

## 2015-07-29 NOTE — Progress Notes (Signed)
Pt had been sleeping and voided incontinently in his sleep, bed linen and gown soaked, urine in floor where it ran off bed.  Pt assisted OOB, cleaned up and sitting in chair.

## 2015-07-29 NOTE — Progress Notes (Signed)
Advanced Heart Failure Rounding Note   Subjective:    Diuresis remains sluggish despite IV lasix and zaroxolyn. CVP remains 10. Feels ok. Blood sugars are still labile.   Co-ox 61%   Objective:   Weight Range:  Vital Signs:   Temp:  [97.4 F (36.3 C)-98.5 F (36.9 C)] 97.8 F (36.6 C) (04/07 0834) Pulse Rate:  [90-117] 117 (04/07 0834) Resp:  [14-32] 19 (04/07 0834) BP: (92-122)/(64-90) 115/90 mmHg (04/07 0834) SpO2:  [97 %-100 %] 98 % (04/07 0834) Weight:  [87.5 kg (192 lb 14.4 oz)] 87.5 kg (192 lb 14.4 oz) (04/07 0500) Last BM Date: 07/29/15  Weight change: Filed Weights   07/27/15 0347 07/28/15 0425 07/29/15 0500  Weight: 88 kg (194 lb 0.1 oz) 87.726 kg (193 lb 6.4 oz) 87.5 kg (192 lb 14.4 oz)    Intake/Output:   Intake/Output Summary (Last 24 hours) at 07/29/15 0901 Last data filed at 07/29/15 0837  Gross per 24 hour  Intake   1140 ml  Output   2850 ml  Net  -1710 ml     Physical Exam:CVP 10-11 General:  No resp difficulty.Sitting on the side of the bed HEENT: normal Neck: supple. JVP to jaw. . Carotids 2+ bilat; no bruits. No lymphadenopathy or thryomegaly appreciated. Cor: PMI nondisplaced. Regular rate & rhythm. No rubs, gallops or murmurs. Lungs: clear Abdomen: soft, nontender, nondistended. No hepatosplenomegaly. No bruits or masses. Good bowel sounds. Extremities: no cyanosis, clubbing, rash, 2-3+ edema RUE PICC RLE dressing.  Neuro: alert & orientedx3, cranial nerves grossly intact. moves all 4 extremities w/o difficulty. Affect pleasant  Telemetry: Sinus tach Labs: Basic Metabolic Panel:  Recent Labs Lab 07/26/15 1707 07/27/15 0209 07/28/15 0445 07/28/15 1437 07/29/15 0704  NA 142 146* 139 140 140  K 3.9 3.7 3.0* 4.1 4.6  CL 104 106 103 103 102  CO2 26 28 25 26 26   GLUCOSE 133* 62* 40* 107* 73  BUN 19 20 23* 25* 31*  CREATININE 1.70* 1.70* 1.77* 1.71* 1.89*  CALCIUM 8.6* 8.7* 8.2* 8.3* 8.1*  MG 2.0  --   --   --   --     Liver  Function Tests:  Recent Labs Lab 07/26/15 1707 07/28/15 0445  AST 18 18  ALT 11* 10*  ALKPHOS 164* 142*  BILITOT 1.4* 1.1  PROT 6.7 6.0*  ALBUMIN 2.8* 2.4*   No results for input(s): LIPASE, AMYLASE in the last 168 hours. No results for input(s): AMMONIA in the last 168 hours.  CBC:  Recent Labs Lab 07/26/15 1707 07/28/15 0445 07/29/15 0539  WBC 10.2 12.8* 10.9*  NEUTROABS 8.3*  --   --   HGB 12.9* 11.3* 11.6*  HCT 40.6 35.9* 36.2*  MCV 85.3 83.9 84.4  PLT 334 350 288    Cardiac Enzymes: No results for input(s): CKTOTAL, CKMB, CKMBINDEX, TROPONINI in the last 168 hours.  BNP: BNP (last 3 results)  Recent Labs  04/10/15 1613 04/11/15 0400 07/26/15 1707  BNP 2401.8* 2568.2* 2061.3*    ProBNP (last 3 results) No results for input(s): PROBNP in the last 8760 hours.    Other results:  Imaging: No results found.   Medications:     Scheduled Medications: . aspirin EC  81 mg Oral Daily  . digoxin  0.125 mg Oral Daily  . enoxaparin (LOVENOX) injection  40 mg Subcutaneous Q24H  . furosemide  80 mg Intravenous BID  . hydrALAZINE  10 mg Oral 3 times per day  . insulin  aspart  0-20 Units Subcutaneous TID WC  . isosorbide mononitrate  30 mg Oral Daily  . metolazone  2.5 mg Oral Daily  . multivitamin with minerals  1 tablet Oral Daily  . rosuvastatin  40 mg Oral q1800  . sodium chloride flush  10-40 mL Intracatheter Q12H  . spironolactone  12.5 mg Oral Daily    Infusions:    PRN Medications: acetaminophen, ondansetron (ZOFRAN) IV, sodium chloride flush   Assessment/Plan/Discussion    1. Acute on chronic systolic CHF: Ischemic cardiomyopathy. EF 15% on last echo from 12/16. Long history of low EF. Has St Jude ICD.  - CVP 10-11. Co-ox 61% but sluggish diuresis and renal function worse.  Will add milrinone 0.125 - Continue Lasix 80 mg IV bid and 2.5 mg metolazone daily .  - Hydralazine 10 tid + Imdur 30 daily.  - On digoxin, and  spironolactone 12.5 daily.  - Has been intolerant of beta blockers in the past.  - Holding off on ACEI/ARB for now with elevated creatinine and need for diuresis.  - Will need eventual RHC.  - May be nearing need for advanced therapies.  - Interrogate St Jude ICD- No VT. Impedance down in March.  2. CAD: No chest pain. Nonviable LAD territory on cardiac MRI in 2007. Last cath in 8/15 with diffuse severe disease, was medically managed. Suspect unlikely to benefit from revascularization given history and lack of angina.  - Continue ASA and statin.  3. CKD: Stage III. Follow carefully with diuresis.  4. DM- Remains labile. Now off lantus 5. RLE wound- WOC appreciated.  6. Hypokalemia- Resolved 7. Acute on chronic renal failure, stage 3  Length of Stay: 3  Depaul Arizpe MD  07/29/2015, 9:01 AM   Advanced Heart Failure Team Pager [phone] (M-F; 7a - 4p)  Please contact [facility] for night-coverage after hours (4p -7a ) and weekends on [email or web address]

## 2015-07-29 NOTE — Progress Notes (Signed)
Patient CBG now 99 after glucose tabs and 2nd snack of graham crackers and peanut butter given.

## 2015-07-29 NOTE — Consult Note (Signed)
   [facility] Inpatient Consult   07/29/2015  [name] [name] Jan 05, 1946 [id number] Patient evaluated for community based chronic disease management services with [place] [facility] as a benefit of patient's [facility]. Spoke with patient at bedside to explain [place] [facility] services.  Patient admitted with HF exacerbation.  Patient endorses [name], to be his primary care provider but he has another provider to start in a few weeks in that same practice.  Consent form signed for services.  Patient will receive post hospital discharge call and will be evaluated for monthly home visits for assessments and disease process education.  Left contact information and [facility] literature at bedside. Made Inpatient Case Manager aware that [place] Management following. Of note, [facility] Management services does not replace or interfere with any services that are arranged by inpatient case management or social work.  For additional questions or referrals please contact:   Natividad Brood, RN BSN [facility] Liaison  [phone] business mobile phone Toll free office [phone]

## 2015-07-30 LAB — GLUCOSE, CAPILLARY
GLUCOSE-CAPILLARY: 127 mg/dL — AB (ref 65–99)
GLUCOSE-CAPILLARY: 193 mg/dL — AB (ref 65–99)
GLUCOSE-CAPILLARY: 212 mg/dL — AB (ref 65–99)
Glucose-Capillary: 231 mg/dL — ABNORMAL HIGH (ref 65–99)
Glucose-Capillary: 182 mg/dL — ABNORMAL HIGH (ref 65–99)

## 2015-07-30 LAB — BASIC METABOLIC PANEL
ANION GAP: 14 (ref 5–15)
BUN: 38 mg/dL — ABNORMAL HIGH (ref 6–20)
CALCIUM: 8.3 mg/dL — AB (ref 8.9–10.3)
CO2: 27 mmol/L (ref 22–32)
Chloride: 101 mmol/L (ref 101–111)
Creatinine, Ser: 2.19 mg/dL — ABNORMAL HIGH (ref 0.61–1.24)
GFR, EST AFRICAN AMERICAN: 34 mL/min — AB (ref >60)
GFR, EST NON AFRICAN AMERICAN: 29 mL/min — AB (ref >60)
GLUCOSE: 148 mg/dL — AB (ref 65–99)
POTASSIUM: 4.2 mmol/L (ref 3.5–5.1)
Sodium: 142 mmol/L (ref 135–145)

## 2015-07-30 LAB — CARBOXYHEMOGLOBIN
Carboxyhemoglobin: 1.4 % (ref 0.5–1.5)
METHEMOGLOBIN: 0.6 % (ref 0.0–1.5)
O2 Saturation: 62.2 %
Total hemoglobin: 11.8 g/dL — ABNORMAL LOW (ref 13.5–18.0)

## 2015-07-30 MED ORDER — TORSEMIDE 20 MG PO TABS: 40.0000 mg | ORAL_TABLET | Freq: Two times a day (BID) | ORAL | Status: DC

## 2015-07-30 MED ORDER — INSULIN ASPART 100 UNIT/ML ~~LOC~~ SOLN
3.0000 [IU] | Freq: Once | SUBCUTANEOUS | Status: AC
  Administered 2015-07-30: 3 [IU] via SUBCUTANEOUS

## 2015-07-30 NOTE — Progress Notes (Signed)
Advanced Heart Failure Rounding Note   Subjective:    Started on milrinone yesterday. Co-ox 62%  Urine output ~2L but weight down 6 pounds. Denies dyspnea. Creatinine continues to climb.   CVP 6   Objective:   Weight Range:  Vital Signs:   Temp:  [98 F (36.7 C)-99.3 F (37.4 C)] 98 F (36.7 C) (04/08 1159) Pulse Rate:  [103-122] 110 (04/08 1159) Resp:  [18-28] 18 (04/08 1159) BP: (96-125)/(64-88) 109/76 mmHg (04/08 1159) SpO2:  [91 %-98 %] 96 % (04/08 1159) Weight:  [84.8 kg (186 lb 15.2 oz)] 84.8 kg (186 lb 15.2 oz) (04/08 0500) Last BM Date: 07/29/15  Weight change: Filed Weights   07/28/15 0425 07/29/15 0500 07/30/15 0500  Weight: 87.726 kg (193 lb 6.4 oz) 87.5 kg (192 lb 14.4 oz) 84.8 kg (186 lb 15.2 oz)    Intake/Output:   Intake/Output Summary (Last 24 hours) at 07/30/15 1404 Last data filed at 07/30/15 1200  Gross per 24 hour  Intake 713.38 ml  Output   2000 ml  Net -1286.62 ml     Physical Exam:CVP 6 General:  No resp difficulty.Sitting on the side of the bed HEENT: normal Neck: supple. JVP 6 . Carotids 2+ bilat; no bruits. No lymphadenopathy or thryomegaly appreciated. Cor: PMI nondisplaced. Regular rate & rhythm. No rubs, gallops or murmurs. Lungs: clear Abdomen: soft, nontender, nondistended. No hepatosplenomegaly. No bruits or masses. Good bowel sounds. Extremities: no cyanosis, clubbing, rash, 2-3+ edema RUE PICC RLE dressing.  Neuro: alert & orientedx3, cranial nerves grossly intact. moves all 4 extremities w/o difficulty. Affect pleasant  Telemetry: Sinus tach Labs: Basic Metabolic Panel:  Recent Labs Lab 07/26/15 1707 07/27/15 0209 07/28/15 0445 07/28/15 1437 07/29/15 0704 07/30/15 0524  NA 142 146* 139 140 140 142  K 3.9 3.7 3.0* 4.1 4.6 4.2  CL 104 106 103 103 102 101  CO2 26 28 25 26 26 27   GLUCOSE 133* 62* 40* 107* 73 148*  BUN 19 20 23* 25* 31* 38*  CREATININE 1.70* 1.70* 1.77* 1.71* 1.89* 2.19*  CALCIUM 8.6* 8.7* 8.2*  8.3* 8.1* 8.3*  MG 2.0  --   --   --   --   --     Liver Function Tests:  Recent Labs Lab 07/26/15 1707 07/28/15 0445  AST 18 18  ALT 11* 10*  ALKPHOS 164* 142*  BILITOT 1.4* 1.1  PROT 6.7 6.0*  ALBUMIN 2.8* 2.4*   No results for input(s): LIPASE, AMYLASE in the last 168 hours. No results for input(s): AMMONIA in the last 168 hours.  CBC:  Recent Labs Lab 07/26/15 1707 07/28/15 0445 07/29/15 0539  WBC 10.2 12.8* 10.9*  NEUTROABS 8.3*  --   --   HGB 12.9* 11.3* 11.6*  HCT 40.6 35.9* 36.2*  MCV 85.3 83.9 84.4  PLT 334 350 288    Cardiac Enzymes: No results for input(s): CKTOTAL, CKMB, CKMBINDEX, TROPONINI in the last 168 hours.  BNP: BNP (last 3 results)  Recent Labs  04/10/15 1613 04/11/15 0400 07/26/15 1707  BNP 2401.8* 2568.2* 2061.3*    ProBNP (last 3 results) No results for input(s): PROBNP in the last 8760 hours.    Other results:  Imaging: No results found.   Medications:     Scheduled Medications: . aspirin EC  81 mg Oral Daily  . digoxin  0.125 mg Oral Daily  . enoxaparin (LOVENOX) injection  40 mg Subcutaneous Q24H  . furosemide  80 mg Intravenous BID  .  hydrALAZINE  10 mg Oral 3 times per day  . isosorbide mononitrate  30 mg Oral Daily  . metolazone  2.5 mg Oral Daily  . multivitamin with minerals  1 tablet Oral Daily  . rosuvastatin  40 mg Oral q1800  . sodium chloride flush  10-40 mL Intracatheter Q12H  . spironolactone  12.5 mg Oral Daily    Infusions: . milrinone 0.125 mcg/kg/min (07/29/15 1900)    PRN Medications: acetaminophen, ondansetron (ZOFRAN) IV, sodium chloride flush   Assessment/Plan/Discussion    1. Acute on chronic systolic CHF: Ischemic cardiomyopathy. EF 15% on last echo from 12/16. Long history of low EF. Has St Jude ICD.  - CVP 6. On milrinone. Co-ox 62% but  renal function worse.  - Co-ox improved and CVP down but still with marked peripheral edema.  - Will stop IV lasix and metolazone. Switch  to torsemide. Will likely need RHC.   - Hydralazine 10 tid + Imdur 30 daily.  - On digoxin, and spironolactone 12.5 daily.  - Has been intolerant of beta blockers in the past.  - Holding off on ACEI/ARB for now with elevated creatinine and need for diuresis.  - May be nearing need for advanced therapies.  - Interrogate St Jude ICD- No VT. Impedance down in March.  2. CAD: No chest pain. Nonviable LAD territory on cardiac MRI in 2007. Last cath in 8/15 with diffuse severe disease, was medically managed. Suspect unlikely to benefit from revascularization given history and lack of angina.  - Continue ASA and statin.  3. CKD: Stage III. Follow carefully with diuresis.  4. DM- Remains labile. Now off lantus 5. RLE wound- WOC appreciated.  6. Hypokalemia- Resolved 7. Acute on chronic renal failure, stage 3  Length of Stay: 4  Bensimhon, Daniel MD  07/30/2015, 2:04 PM   Advanced Heart Failure Team Pager [phone] (M-F; 7a - 4p)  Please contact [facility] for night-coverage after hours (4p -7a ) and weekends on [email or web address]

## 2015-07-30 NOTE — Progress Notes (Signed)
CBG-231 mg/dl, NP made aware with order. Continue to monitor.

## 2015-07-31 ENCOUNTER — Inpatient Hospital Stay (HOSPITAL_COMMUNITY): Payer: Commercial Managed Care - HMO

## 2015-07-31 LAB — GLUCOSE, CAPILLARY
GLUCOSE-CAPILLARY: 177 mg/dL — AB (ref 65–99)
GLUCOSE-CAPILLARY: 250 mg/dL — AB (ref 65–99)
Glucose-Capillary: 236 mg/dL — ABNORMAL HIGH (ref 65–99)
Glucose-Capillary: 237 mg/dL — ABNORMAL HIGH (ref 65–99)

## 2015-07-31 LAB — BASIC METABOLIC PANEL
ANION GAP: 13 (ref 5–15)
BUN: 44 mg/dL — AB (ref 6–20)
CALCIUM: 8.6 mg/dL — AB (ref 8.9–10.3)
CHLORIDE: 100 mmol/L — AB (ref 101–111)
CO2: 26 mmol/L (ref 22–32)
CREATININE: 2.5 mg/dL — AB (ref 0.61–1.24)
GFR calc Af Amer: 29 mL/min — ABNORMAL LOW (ref >60)
GFR, EST NON AFRICAN AMERICAN: 25 mL/min — AB (ref >60)
GLUCOSE: 170 mg/dL — AB (ref 65–99)
POTASSIUM: 4 mmol/L (ref 3.5–5.1)
Sodium: 139 mmol/L (ref 135–145)

## 2015-07-31 LAB — CARBOXYHEMOGLOBIN
Carboxyhemoglobin: 1.2 % (ref 0.5–1.5)
METHEMOGLOBIN: 0.9 % (ref 0.0–1.5)
O2 Saturation: 66.7 %
Total hemoglobin: 10.8 g/dL — ABNORMAL LOW (ref 13.5–18.0)

## 2015-07-31 NOTE — Progress Notes (Signed)
Advanced Heart Failure Rounding Note   Subjective:    Started on milrinone earlier this week. Lasix stopped yesterday due to worsening creatinine. Torsemide added. Co-ox 67%  Weight down 6 pounds. Denies dyspnea. Creatinine continues to climb.   CVP 6   Objective:   Weight Range:  Vital Signs:   Temp:  [98.3 F (36.8 C)-98.8 F (37.1 C)] 98.3 F (36.8 C) (04/09 1120) Pulse Rate:  [108-122] 120 (04/09 1200) Resp:  [15-22] 20 (04/09 1200) BP: (95-127)/(63-91) 110/91 mmHg (04/09 1200) SpO2:  [92 %-98 %] 96 % (04/09 1200) Weight:  [82.555 kg (182 lb)] 82.555 kg (182 lb) (04/09 0400) Last BM Date: 07/29/15  Weight change: Filed Weights   07/29/15 0500 07/30/15 0500 07/31/15 0400  Weight: 87.5 kg (192 lb 14.4 oz) 84.8 kg (186 lb 15.2 oz) 82.555 kg (182 lb)    Intake/Output:   Intake/Output Summary (Last 24 hours) at 07/31/15 1509 Last data filed at 07/31/15 1400  Gross per 24 hour  Intake  525.9 ml  Output   1725 ml  Net -1199.1 ml     Physical Exam:CVP 5-6 General:  No resp difficulty.Sitting on the side of the bed HEENT: normal Neck: supple. JVP 5-6 . Carotids 2+ bilat; no bruits. No lymphadenopathy or thryomegaly appreciated. Cor: PMI nondisplaced. Regular rate & rhythm. No rubs, gallops or murmurs. Lungs: clear Abdomen: soft, nontender, nondistended. No hepatosplenomegaly. No bruits or masses. Good bowel sounds. Extremities: no cyanosis, clubbing, rash, 2+ edema RUE PICC RLE dressing.  Neuro: alert & orientedx3, cranial nerves grossly intact. moves all 4 extremities w/o difficulty. Affect pleasant  Telemetry: Sinus tach Labs: Basic Metabolic Panel:  Recent Labs Lab 07/26/15 1707  07/28/15 0445 07/28/15 1437 07/29/15 0704 07/30/15 0524 07/31/15 0613  NA 142  < > 139 140 140 142 139  K 3.9  < > 3.0* 4.1 4.6 4.2 4.0  CL 104  < > 103 103 102 101 100*  CO2 26  < > 25 26 26 27 26   GLUCOSE 133*  < > 40* 107* 73 148* 170*  BUN 19  < > 23* 25* 31* 38*  44*  CREATININE 1.70*  < > 1.77* 1.71* 1.89* 2.19* 2.50*  CALCIUM 8.6*  < > 8.2* 8.3* 8.1* 8.3* 8.6*  MG 2.0  --   --   --   --   --   --   < > = values in this interval not displayed.  Liver Function Tests:  Recent Labs Lab 07/26/15 1707 07/28/15 0445  AST 18 18  ALT 11* 10*  ALKPHOS 164* 142*  BILITOT 1.4* 1.1  PROT 6.7 6.0*  ALBUMIN 2.8* 2.4*   No results for input(s): LIPASE, AMYLASE in the last 168 hours. No results for input(s): AMMONIA in the last 168 hours.  CBC:  Recent Labs Lab 07/26/15 1707 07/28/15 0445 07/29/15 0539  WBC 10.2 12.8* 10.9*  NEUTROABS 8.3*  --   --   HGB 12.9* 11.3* 11.6*  HCT 40.6 35.9* 36.2*  MCV 85.3 83.9 84.4  PLT 334 350 288    Cardiac Enzymes: No results for input(s): CKTOTAL, CKMB, CKMBINDEX, TROPONINI in the last 168 hours.  BNP: BNP (last 3 results)  Recent Labs  04/10/15 1613 04/11/15 0400 07/26/15 1707  BNP 2401.8* 2568.2* 2061.3*    ProBNP (last 3 results) No results for input(s): PROBNP in the last 8760 hours.    Other results:  Imaging: No results found.   Medications:  Scheduled Medications: . aspirin EC  81 mg Oral Daily  . digoxin  0.125 mg Oral Daily  . enoxaparin (LOVENOX) injection  40 mg Subcutaneous Q24H  . hydrALAZINE  10 mg Oral 3 times per day  . isosorbide mononitrate  30 mg Oral Daily  . multivitamin with minerals  1 tablet Oral Daily  . rosuvastatin  40 mg Oral q1800  . sodium chloride flush  10-40 mL Intracatheter Q12H  . spironolactone  12.5 mg Oral Daily    Infusions: . milrinone 0.125 mcg/kg/min (07/30/15 0800)    PRN Medications: acetaminophen, ondansetron (ZOFRAN) IV, sodium chloride flush   Assessment/Plan/Discussion    1. Acute on chronic systolic CHF: Ischemic cardiomyopathy. EF 15% on last echo from 12/16. Long history of low EF. Has St Jude ICD.  - CVP 5-6. On milrinone. Co-ox 67% but  renal function worse.  - Co-ox improved and CVP down but still with  marked peripheral edema.  - Will stop torsemide. Plan RHC tomorrow.   - Hydralazine 10 tid + Imdur 30 daily.  - On digoxin, and spironolactone 12.5 daily. Check dig level tomorrow with rising creatinine.  - Has been intolerant of beta blockers in the past.  - Holding off on ACEI/ARB for now with elevated creatinine and need for diuresis.  - May be nearing need for advanced therapies but will need to get renal function better.  - Interrogate St Jude ICD- No VT. Impedance down in March.  2. CAD: No chest pain. Nonviable LAD territory on cardiac MRI in 2007. Last cath in 8/15 with diffuse severe disease, was medically managed. Suspect unlikely to benefit from revascularization given history and lack of angina.  - Continue ASA and statin.  3. CKD: Stage III. Follow carefully with diuresis.  4. DM- Remains labile. Now off lantus 5. RLE wound- WOC appreciated.  6. Hypokalemia- Resolved 7. Acute on chronic renal failure, stage 3 - Check renal u/s  Length of Stay: 5  Bhavya Eschete MD  07/31/2015, 3:09 PM   Advanced Heart Failure Team Pager [phone] (M-F; 7a - 4p)  Please contact [facility] for night-coverage after hours (4p -7a ) and weekends on [email or web address]

## 2015-08-01 ENCOUNTER — Encounter (HOSPITAL_COMMUNITY): Payer: Self-pay | Admitting: Internal Medicine

## 2015-08-01 ENCOUNTER — Encounter (HOSPITAL_COMMUNITY)
Admission: RE | Disposition: A | Discharge status: Home Health | Payer: Self-pay | Source: Ambulatory Visit | Attending: Internal Medicine

## 2015-08-01 HISTORY — PX: CARDIAC CATHETERIZATION: SHX172

## 2015-08-01 LAB — GLUCOSE, CAPILLARY
GLUCOSE-CAPILLARY: 161 mg/dL — AB (ref 65–99)
GLUCOSE-CAPILLARY: 240 mg/dL — AB (ref 65–99)
Glucose-Capillary: 151 mg/dL — ABNORMAL HIGH (ref 65–99)
Glucose-Capillary: 191 mg/dL — ABNORMAL HIGH (ref 65–99)

## 2015-08-01 LAB — POCT I-STAT 3, VENOUS BLOOD GAS (G3P V)
ACID-BASE EXCESS: 4 mmol/L — AB (ref 0.0–2.0)
ACID-BASE EXCESS: 4 mmol/L — AB (ref 0.0–2.0)
Acid-Base Excess: 4 mmol/L — ABNORMAL HIGH (ref 0.0–2.0)
BICARBONATE: 27.8 meq/L — AB (ref 20.0–24.0)
BICARBONATE: 27.9 meq/L — AB (ref 20.0–24.0)
Bicarbonate: 27.7 mEq/L — ABNORMAL HIGH (ref 20.0–24.0)
O2 SAT: 65 %
O2 SAT: 67 %
O2 Saturation: 68 %
PCO2 VEN: 38.6 mmHg — AB (ref 45.0–50.0)
PO2 VEN: 32 mmHg (ref 31.0–45.0)
PO2 VEN: 32 mmHg (ref 31.0–45.0)
TCO2: 29 mmol/L (ref 0–100)
TCO2: 29 mmol/L (ref 0–100)
TCO2: 29 mmol/L (ref 0–100)
pCO2, Ven: 38.4 mmHg — ABNORMAL LOW (ref 45.0–50.0)
pCO2, Ven: 38.6 mmHg — ABNORMAL LOW (ref 45.0–50.0)
pH, Ven: 7.464 — ABNORMAL HIGH (ref 7.250–7.300)
pH, Ven: 7.466 — ABNORMAL HIGH (ref 7.250–7.300)
pH, Ven: 7.468 — ABNORMAL HIGH (ref 7.250–7.300)
pO2, Ven: 33 mmHg (ref 31.0–45.0)

## 2015-08-01 LAB — CBC
HEMATOCRIT: 34.7 % — AB (ref 39.0–52.0)
HEMOGLOBIN: 10.6 g/dL — AB (ref 13.0–17.0)
MCH: 25.5 pg — ABNORMAL LOW (ref 26.0–34.0)
MCHC: 30.5 g/dL (ref 30.0–36.0)
MCV: 83.4 fL (ref 78.0–100.0)
Platelets: 271 10*3/uL (ref 150–400)
RBC: 4.16 MIL/uL — ABNORMAL LOW (ref 4.22–5.81)
RDW: 14.5 % (ref 11.5–15.5)
WBC: 8.7 10*3/uL (ref 4.0–10.5)

## 2015-08-01 LAB — BASIC METABOLIC PANEL
ANION GAP: 13 (ref 5–15)
BUN: 42 mg/dL — ABNORMAL HIGH (ref 6–20)
CHLORIDE: 99 mmol/L — AB (ref 101–111)
CO2: 27 mmol/L (ref 22–32)
CREATININE: 2.46 mg/dL — AB (ref 0.61–1.24)
Calcium: 8.5 mg/dL — ABNORMAL LOW (ref 8.9–10.3)
GFR calc non Af Amer: 25 mL/min — ABNORMAL LOW (ref >60)
GFR, EST AFRICAN AMERICAN: 29 mL/min — AB (ref >60)
Glucose, Bld: 187 mg/dL — ABNORMAL HIGH (ref 65–99)
Potassium: 3.8 mmol/L (ref 3.5–5.1)
SODIUM: 139 mmol/L (ref 135–145)

## 2015-08-01 LAB — PSA: PSA: 4.89 ng/mL — ABNORMAL HIGH (ref 0.00–4.00)

## 2015-08-01 LAB — CREATININE, SERUM
Creatinine, Ser: 2.36 mg/dL — ABNORMAL HIGH (ref 0.61–1.24)
GFR calc Af Amer: 31 mL/min — ABNORMAL LOW (ref >60)
GFR, EST NON AFRICAN AMERICAN: 26 mL/min — AB (ref >60)

## 2015-08-01 LAB — CARBOXYHEMOGLOBIN
Carboxyhemoglobin: 1.6 % — ABNORMAL HIGH (ref 0.5–1.5)
Methemoglobin: 0.7 % (ref 0.0–1.5)
O2 SAT: 70.8 %
Total hemoglobin: 10.5 g/dL — ABNORMAL LOW (ref 13.5–18.0)

## 2015-08-01 LAB — DIGOXIN LEVEL: DIGOXIN LVL: 0.3 ng/mL — AB (ref 0.8–2.0)

## 2015-08-01 SURGERY — RIGHT HEART CATH
Anesthesia: LOCAL

## 2015-08-01 MED ORDER — SODIUM CHLORIDE 0.9% FLUSH
3.0000 mL | Freq: Two times a day (BID) | INTRAVENOUS | Status: DC
  Administered 2015-08-03: 3 mL via INTRAVENOUS

## 2015-08-01 MED ORDER — ASPIRIN 81 MG PO CHEW
81.0000 mg | CHEWABLE_TABLET | ORAL | Status: AC
  Administered 2015-08-01: 81 mg via ORAL
  Filled 2015-08-01: qty 1

## 2015-08-01 MED ORDER — SODIUM CHLORIDE 0.9 % IV SOLN
250.0000 mL | INTRAVENOUS | Status: DC | PRN
  Administered 2015-08-01: 10 mL/h via INTRAVENOUS

## 2015-08-01 MED ORDER — SODIUM CHLORIDE 0.9% FLUSH: 3.0000 mL | Freq: Two times a day (BID) | INTRAVENOUS | Status: DC

## 2015-08-01 MED ORDER — TAMSULOSIN HCL 0.4 MG PO CAPS
0.4000 mg | ORAL_CAPSULE | Freq: Every day | ORAL | Status: DC
  Administered 2015-08-01 – 2015-08-04 (×4): 0.4 mg via ORAL
  Filled 2015-08-01 (×4): qty 1

## 2015-08-01 MED ORDER — LIDOCAINE HCL (PF) 1 % IJ SOLN
INTRAMUSCULAR | Status: DC | PRN
  Administered 2015-08-01: 5 mL

## 2015-08-01 MED ORDER — INSULIN ASPART 100 UNIT/ML ~~LOC~~ SOLN
0.0000 [IU] | Freq: Three times a day (TID) | SUBCUTANEOUS | Status: DC
  Administered 2015-08-01: 3 [IU] via SUBCUTANEOUS
  Administered 2015-08-02: 2 [IU] via SUBCUTANEOUS
  Administered 2015-08-02: 3 [IU] via SUBCUTANEOUS
  Administered 2015-08-02: 2 [IU] via SUBCUTANEOUS
  Administered 2015-08-03: 1 [IU] via SUBCUTANEOUS
  Administered 2015-08-03: 3 [IU] via SUBCUTANEOUS
  Administered 2015-08-03: 2 [IU] via SUBCUTANEOUS
  Administered 2015-08-04 (×2): 1 [IU] via SUBCUTANEOUS

## 2015-08-01 MED ORDER — LIDOCAINE HCL (PF) 1 % IJ SOLN
INTRAMUSCULAR | Status: AC
  Filled 2015-08-01: qty 30

## 2015-08-01 MED ORDER — SODIUM CHLORIDE 0.9% FLUSH: 3.0000 mL | INTRAVENOUS | Status: DC | PRN

## 2015-08-01 MED ORDER — SODIUM CHLORIDE 0.9 % IV SOLN: 250.0000 mL | INTRAVENOUS | Status: DC | PRN

## 2015-08-01 MED ORDER — SODIUM CHLORIDE 0.9 % IV SOLN
INTRAVENOUS | Status: DC
  Administered 2015-08-01: 10 mL/h via INTRAVENOUS

## 2015-08-01 MED ORDER — INSULIN ASPART 100 UNIT/ML ~~LOC~~ SOLN
0.0000 [IU] | Freq: Every day | SUBCUTANEOUS | Status: DC
  Administered 2015-08-02: 2 [IU] via SUBCUTANEOUS

## 2015-08-01 MED ORDER — ASPIRIN 81 MG PO CHEW: 81.0000 mg | CHEWABLE_TABLET | ORAL | Status: DC

## 2015-08-01 MED ORDER — SODIUM CHLORIDE 0.9 % IV SOLN: INTRAVENOUS | Status: DC

## 2015-08-01 MED ORDER — HEPARIN (PORCINE) IN NACL 2-0.9 UNIT/ML-% IJ SOLN
INTRAMUSCULAR | Status: DC | PRN
  Administered 2015-08-01: 500 mL

## 2015-08-01 MED ORDER — ONDANSETRON HCL 4 MG/2ML IJ SOLN: 4.0000 mg | Freq: Four times a day (QID) | INTRAMUSCULAR | Status: DC | PRN

## 2015-08-01 MED ORDER — ENOXAPARIN SODIUM 40 MG/0.4ML ~~LOC~~ SOLN
40.0000 mg | SUBCUTANEOUS | Status: DC
  Administered 2015-08-02 – 2015-08-04 (×3): 40 mg via SUBCUTANEOUS
  Filled 2015-08-01 (×3): qty 0.4

## 2015-08-01 MED ORDER — ACETAMINOPHEN 325 MG PO TABS: 650.0000 mg | ORAL_TABLET | ORAL | Status: DC | PRN

## 2015-08-01 SURGICAL SUPPLY — 12 items
CATH SWAN GANZ 7F STRAIGHT (CATHETERS) ×1 IMPLANT
KIT HEART RIGHT NAMIC (KITS) ×2 IMPLANT
PACK CARDIAC CATHETERIZATION (CUSTOM PROCEDURE TRAY) ×2 IMPLANT
PROTECTION STATION PRESSURIZED (MISCELLANEOUS) ×2
SHEATH PINNACLE 7F 10CM (SHEATH) ×2 IMPLANT
SLEEVE REPOSITIONING LENGTH 30 (MISCELLANEOUS) ×1 IMPLANT
STATION PROTECTION PRESSURIZED (MISCELLANEOUS) IMPLANT
TRANSDUCER W/STOPCOCK (MISCELLANEOUS) ×3 IMPLANT
TUBING ART PRESS 72  MALE/FEM (TUBING) ×1
TUBING ART PRESS 72 MALE/FEM (TUBING) IMPLANT
TUBING CIL FLEX 10 FLL-RA (TUBING) ×2 IMPLANT
WIRE EMERALD 3MM-J .025X260CM (WIRE) ×1 IMPLANT

## 2015-08-01 NOTE — H&P (View-Only) (Signed)
Advanced Heart Failure Rounding Note   Subjective:    Started on milrinone earlier this week. Lasix stopped over the weekend due to worsening creatinine. Feels fine. Edema much improved. Having fecal incontinence.  Co-ox 71%   Denies dyspnea. Creatinine slightly improved.   CVP 5   Objective:   Weight Range:  Vital Signs:   Temp:  [98.2 F (36.8 C)-99.1 F (37.3 C)] 99.1 F (37.3 C) (04/09 2317) Pulse Rate:  [106-122] 106 (04/10 0400) Resp:  [12-23] 15 (04/10 0400) BP: (98-139)/(63-91) 108/76 mmHg (04/10 0501) SpO2:  [92 %-98 %] 96 % (04/10 0400) Weight:  [84.324 kg (185 lb 14.4 oz)] 84.324 kg (185 lb 14.4 oz) (04/10 0448) Last BM Date: 07/31/15  Weight change: Filed Weights   07/30/15 0500 07/31/15 0400 08/01/15 0448  Weight: 84.8 kg (186 lb 15.2 oz) 82.555 kg (182 lb) 84.324 kg (185 lb 14.4 oz)    Intake/Output:   Intake/Output Summary (Last 24 hours) at 08/01/15 0735 Last data filed at 08/01/15 0400  Gross per 24 hour  Intake  759.6 ml  Output   1200 ml  Net -440.4 ml     Physical Exam:CVP 5 General:  No resp difficulty.Sitting in bed HEENT: normal Neck: supple. JVP 5 . Carotids 2+ bilat; no bruits. No lymphadenopathy or thryomegaly appreciated. Cor: PMI nondisplaced. Regular rate & rhythm. No rubs, gallops or murmurs. Lungs: clear Abdomen: soft, nontender, nondistended. No hepatosplenomegaly. No bruits or masses. Good bowel sounds. Extremities: no cyanosis, clubbing, rash, trace edema RUE PICC RLE dressing.  Neuro: alert & orientedx3, cranial nerves grossly intact. moves all 4 extremities w/o difficulty. Affect pleasant  Telemetry: Sinus tach Labs: Basic Metabolic Panel:  Recent Labs Lab 07/26/15 1707  07/28/15 1437 07/29/15 0704 07/30/15 0524 07/31/15 0613 08/01/15 0500  NA 142  < > 140 140 142 139 139  K 3.9  < > 4.1 4.6 4.2 4.0 3.8  CL 104  < > 103 102 101 100* 99*  CO2 26  < > 26 26 27 26 27   GLUCOSE 133*  < > 107* 73 148* 170* 187*    BUN 19  < > 25* 31* 38* 44* 42*  CREATININE 1.70*  < > 1.71* 1.89* 2.19* 2.50* 2.46*  CALCIUM 8.6*  < > 8.3* 8.1* 8.3* 8.6* 8.5*  MG 2.0  --   --   --   --   --   --   < > = values in this interval not displayed.  Liver Function Tests:  Recent Labs Lab 07/26/15 1707 07/28/15 0445  AST 18 18  ALT 11* 10*  ALKPHOS 164* 142*  BILITOT 1.4* 1.1  PROT 6.7 6.0*  ALBUMIN 2.8* 2.4*   No results for input(s): LIPASE, AMYLASE in the last 168 hours. No results for input(s): AMMONIA in the last 168 hours.  CBC:  Recent Labs Lab 07/26/15 1707 07/28/15 0445 07/29/15 0539  WBC 10.2 12.8* 10.9*  NEUTROABS 8.3*  --   --   HGB 12.9* 11.3* 11.6*  HCT 40.6 35.9* 36.2*  MCV 85.3 83.9 84.4  PLT 334 350 288    Cardiac Enzymes: No results for input(s): CKTOTAL, CKMB, CKMBINDEX, TROPONINI in the last 168 hours.  BNP: BNP (last 3 results)  Recent Labs  04/10/15 1613 04/11/15 0400 07/26/15 1707  BNP 2401.8* 2568.2* 2061.3*    ProBNP (last 3 results) No results for input(s): PROBNP in the last 8760 hours.    Other results:  Imaging: US Renal Port  07/31/2015  CLINICAL DATA:  Acute on chronic renal failure. EXAM: RENAL / URINARY TRACT ULTRASOUND COMPLETE COMPARISON:  12/26/2011 FINDINGS: Right Kidney: Length: 11.9 cm. Moderate hydronephrosis is again noted and appears similar to previous exam. Mild increased parenchymal echogenicity. No mass. Left Kidney: Length: 10.4 cm. Diffuse cortical thinning identified. Moderate hydronephrosis is again identified. Hydroureter is identified. Bladder: Distended thick walled urinary bladder is identified. Other: Ascites is noted. The prostate gland is enlarged measuring 7.3 x 3.7 x 6.7 cm. IMPRESSION: 1. Bilateral moderate hydronephrosis. 2. Thick-walled urinary bladder suggests chronic bladder outlet obstruction. 3. Prostate gland enlargement 4. Ascites. Electronically Signed   By: Kerby Moors M.D.   On: 07/31/2015 16:34     Medications:      Scheduled Medications: . aspirin EC  81 mg Oral Daily  . digoxin  0.125 mg Oral Daily  . enoxaparin (LOVENOX) injection  40 mg Subcutaneous Q24H  . hydrALAZINE  10 mg Oral 3 times per day  . isosorbide mononitrate  30 mg Oral Daily  . multivitamin with minerals  1 tablet Oral Daily  . rosuvastatin  40 mg Oral q1800  . sodium chloride flush  10-40 mL Intracatheter Q12H  . spironolactone  12.5 mg Oral Daily    Infusions: . milrinone 0.125 mcg/kg/min (07/31/15 1732)    PRN Medications: acetaminophen, ondansetron (ZOFRAN) IV, sodium chloride flush   Assessment/Plan/Discussion    1. Acute on chronic systolic CHF: Ischemic cardiomyopathy. EF 15% on last echo from 12/16. Long history of low EF. Has St Jude ICD.  - CVP 5. On milrinone. Co-ox good.  - Off diuretics due to worsening Cr. For [place] today. Will hold milrinone.   - Hydralazine 10 tid + Imdur 30 daily.  - On digoxin, and spironolactone 12.5 daily. Dig elvel 0.3  - Has been intolerant of beta blockers in the past.  - Holding off on ACEI/ARB for now with elevated creatinine and need for diuresis.  - May be nearing need for advanced therapies but will need to get renal function better and DM2 improved.  - Interrogate St Jude ICD- No VT. Impedance down in March.  2. CAD: No chest pain. Nonviable LAD territory on cardiac MRI in 2007. Last cath in 8/15 with diffuse severe disease, was medically managed. Suspect unlikely to benefit from revascularization given history and lack of angina.  - Continue ASA and statin.  3. CKD: Stage III. Follow carefully with diuresis.  4. DM- Remains labile. Now off lantus 5. RLE wound- WOC appreciated.  6. Hypokalemia- Resolved 7. Acute on chronic renal failure, stage 3   --Renal u/s with Bilateral moderate hydronephrosis (chronic), thick-walled urinary bladder suggests chronic bladder outlet Obstruction, Prostate gland enlargement   --Will start flomax. Check PSA. May need  urology f/u    Length of Stay: 6  Glori Bickers MD  08/01/2015, 7:35 AM   Advanced Heart Failure Team Pager [phone] (M-F; [place])  Please contact [facility] for night-coverage after hours (4p -7a ) and weekends on [email or web address]

## 2015-08-01 NOTE — Progress Notes (Signed)
Advanced Heart Failure Rounding Note   Subjective:    Started on milrinone earlier this week. Lasix stopped over the weekend due to worsening creatinine. Feels fine. Edema much improved. Having fecal incontinence.  Co-ox 71%   Denies dyspnea. Creatinine slightly improved.   CVP 5   Objective:   Weight Range:  Vital Signs:   Temp:  [98.2 F (36.8 C)-99.1 F (37.3 C)] 99.1 F (37.3 C) (04/09 2317) Pulse Rate:  [106-122] 106 (04/10 0400) Resp:  [12-23] 15 (04/10 0400) BP: (98-139)/(63-91) 108/76 mmHg (04/10 0501) SpO2:  [92 %-98 %] 96 % (04/10 0400) Weight:  [84.324 kg (185 lb 14.4 oz)] 84.324 kg (185 lb 14.4 oz) (04/10 0448) Last BM Date: 07/31/15  Weight change: Filed Weights   07/30/15 0500 07/31/15 0400 08/01/15 0448  Weight: 84.8 kg (186 lb 15.2 oz) 82.555 kg (182 lb) 84.324 kg (185 lb 14.4 oz)    Intake/Output:   Intake/Output Summary (Last 24 hours) at 08/01/15 0735 Last data filed at 08/01/15 0400  Gross per 24 hour  Intake  759.6 ml  Output   1200 ml  Net -440.4 ml     Physical Exam:CVP 5 General:  No resp difficulty.Sitting in bed HEENT: normal Neck: supple. JVP 5 . Carotids 2+ bilat; no bruits. No lymphadenopathy or thryomegaly appreciated. Cor: PMI nondisplaced. Regular rate & rhythm. No rubs, gallops or murmurs. Lungs: clear Abdomen: soft, nontender, nondistended. No hepatosplenomegaly. No bruits or masses. Good bowel sounds. Extremities: no cyanosis, clubbing, rash, trace edema RUE PICC RLE dressing.  Neuro: alert & orientedx3, cranial nerves grossly intact. moves all 4 extremities w/o difficulty. Affect pleasant  Telemetry: Sinus tach Labs: Basic Metabolic Panel:  Recent Labs Lab 07/26/15 1707  07/28/15 1437 07/29/15 0704 07/30/15 0524 07/31/15 0613 08/01/15 0500  NA 142  < > 140 140 142 139 139  K 3.9  < > 4.1 4.6 4.2 4.0 3.8  CL 104  < > 103 102 101 100* 99*  CO2 26  < > 26 26 27 26 27   GLUCOSE 133*  < > 107* 73 148* 170* 187*    BUN 19  < > 25* 31* 38* 44* 42*  CREATININE 1.70*  < > 1.71* 1.89* 2.19* 2.50* 2.46*  CALCIUM 8.6*  < > 8.3* 8.1* 8.3* 8.6* 8.5*  MG 2.0  --   --   --   --   --   --   < > = values in this interval not displayed.  Liver Function Tests:  Recent Labs Lab 07/26/15 1707 07/28/15 0445  AST 18 18  ALT 11* 10*  ALKPHOS 164* 142*  BILITOT 1.4* 1.1  PROT 6.7 6.0*  ALBUMIN 2.8* 2.4*   No results for input(s): LIPASE, AMYLASE in the last 168 hours. No results for input(s): AMMONIA in the last 168 hours.  CBC:  Recent Labs Lab 07/26/15 1707 07/28/15 0445 07/29/15 0539  WBC 10.2 12.8* 10.9*  NEUTROABS 8.3*  --   --   HGB 12.9* 11.3* 11.6*  HCT 40.6 35.9* 36.2*  MCV 85.3 83.9 84.4  PLT 334 350 288    Cardiac Enzymes: No results for input(s): CKTOTAL, CKMB, CKMBINDEX, TROPONINI in the last 168 hours.  BNP: BNP (last 3 results)  Recent Labs  04/10/15 1613 04/11/15 0400 07/26/15 1707  BNP 2401.8* 2568.2* 2061.3*    ProBNP (last 3 results) No results for input(s): PROBNP in the last 8760 hours.    Other results:  Imaging: US Renal Port  07/31/2015  CLINICAL DATA:  Acute on chronic renal failure. EXAM: RENAL / URINARY TRACT ULTRASOUND COMPLETE COMPARISON:  12/26/2011 FINDINGS: Right Kidney: Length: 11.9 cm. Moderate hydronephrosis is again noted and appears similar to previous exam. Mild increased parenchymal echogenicity. No mass. Left Kidney: Length: 10.4 cm. Diffuse cortical thinning identified. Moderate hydronephrosis is again identified. Hydroureter is identified. Bladder: Distended thick walled urinary bladder is identified. Other: Ascites is noted. The prostate gland is enlarged measuring 7.3 x 3.7 x 6.7 cm. IMPRESSION: 1. Bilateral moderate hydronephrosis. 2. Thick-walled urinary bladder suggests chronic bladder outlet obstruction. 3. Prostate gland enlargement 4. Ascites. Electronically Signed   By: Kerby Moors M.D.   On: 07/31/2015 16:34     Medications:      Scheduled Medications: . aspirin EC  81 mg Oral Daily  . digoxin  0.125 mg Oral Daily  . enoxaparin (LOVENOX) injection  40 mg Subcutaneous Q24H  . hydrALAZINE  10 mg Oral 3 times per day  . isosorbide mononitrate  30 mg Oral Daily  . multivitamin with minerals  1 tablet Oral Daily  . rosuvastatin  40 mg Oral q1800  . sodium chloride flush  10-40 mL Intracatheter Q12H  . spironolactone  12.5 mg Oral Daily    Infusions: . milrinone 0.125 mcg/kg/min (07/31/15 1732)    PRN Medications: acetaminophen, ondansetron (ZOFRAN) IV, sodium chloride flush   Assessment/Plan/Discussion    1. Acute on chronic systolic CHF: Ischemic cardiomyopathy. EF 15% on last echo from 12/16. Long history of low EF. Has St Jude ICD.  - CVP 5. On milrinone. Co-ox good.  - Off diuretics due to worsening Cr. For [place] today. Will hold milrinone.   - Hydralazine 10 tid + Imdur 30 daily.  - On digoxin, and spironolactone 12.5 daily. Dig elvel 0.3  - Has been intolerant of beta blockers in the past.  - Holding off on ACEI/ARB for now with elevated creatinine and need for diuresis.  - May be nearing need for advanced therapies but will need to get renal function better and DM2 improved.  - Interrogate St Jude ICD- No VT. Impedance down in March.  2. CAD: No chest pain. Nonviable LAD territory on cardiac MRI in 2007. Last cath in 8/15 with diffuse severe disease, was medically managed. Suspect unlikely to benefit from revascularization given history and lack of angina.  - Continue ASA and statin.  3. CKD: Stage III. Follow carefully with diuresis.  4. DM- Remains labile. Now off lantus 5. RLE wound- WOC appreciated.  6. Hypokalemia- Resolved 7. Acute on chronic renal failure, stage 3   --Renal u/s with Bilateral moderate hydronephrosis (chronic), thick-walled urinary bladder suggests chronic bladder outlet Obstruction, Prostate gland enlargement   --Will start flomax. Check PSA. May need  urology f/u    Length of Stay: 6  Glori Bickers MD  08/01/2015, 7:35 AM   Advanced Heart Failure Team Pager [phone] (M-F; [place])  Please contact [facility] for night-coverage after hours (4p -7a ) and weekends on [email or web address]

## 2015-08-01 NOTE — Interval H&P Note (Signed)
History and Physical Interval Note:  08/01/2015 9:01 AM  [name]  has presented today for surgery, with the diagnosis of CHF  The various methods of treatment have been discussed with the patient and family. After consideration of risks, benefits and other options for treatment, the patient has consented to  Procedure(s): Right Heart Cath (N/A) as a surgical intervention .  The patient's history has been reviewed, patient examined, no change in status, stable for surgery.  I have reviewed the patient's chart and labs.  Questions were answered to the patient's satisfaction.     Elba Dendinger, Quillian Quince

## 2015-08-02 LAB — BASIC METABOLIC PANEL
Anion gap: 13 (ref 5–15)
BUN: 37 mg/dL — AB (ref 6–20)
CO2: 26 mmol/L (ref 22–32)
CREATININE: 2.22 mg/dL — AB (ref 0.61–1.24)
Calcium: 8.4 mg/dL — ABNORMAL LOW (ref 8.9–10.3)
Chloride: 101 mmol/L (ref 101–111)
GFR calc Af Amer: 33 mL/min — ABNORMAL LOW (ref >60)
GFR, EST NON AFRICAN AMERICAN: 28 mL/min — AB (ref >60)
GLUCOSE: 158 mg/dL — AB (ref 65–99)
Potassium: 4 mmol/L (ref 3.5–5.1)
SODIUM: 140 mmol/L (ref 135–145)

## 2015-08-02 LAB — CARBOXYHEMOGLOBIN
CARBOXYHEMOGLOBIN: 1.1 % (ref 0.5–1.5)
METHEMOGLOBIN: 0.9 % (ref 0.0–1.5)
O2 SAT: 58.3 %
TOTAL HEMOGLOBIN: 11 g/dL — AB (ref 13.5–18.0)

## 2015-08-02 LAB — GLUCOSE, CAPILLARY
GLUCOSE-CAPILLARY: 247 mg/dL — AB (ref 65–99)
Glucose-Capillary: 195 mg/dL — ABNORMAL HIGH (ref 65–99)
Glucose-Capillary: 183 mg/dL — ABNORMAL HIGH (ref 65–99)

## 2015-08-02 LAB — MRSA PCR SCREENING: MRSA BY PCR: NEGATIVE

## 2015-08-02 MED ORDER — METOLAZONE 2.5 MG PO TABS
2.5000 mg | ORAL_TABLET | Freq: Once | ORAL | Status: AC
  Administered 2015-08-02: 2.5 mg via ORAL
  Filled 2015-08-02: qty 1

## 2015-08-02 MED ORDER — IVABRADINE HCL 5 MG PO TABS
5.0000 mg | ORAL_TABLET | Freq: Two times a day (BID) | ORAL | Status: DC
  Administered 2015-08-02 – 2015-08-04 (×5): 5 mg via ORAL
  Filled 2015-08-02 (×7): qty 1

## 2015-08-02 MED ORDER — POTASSIUM CHLORIDE CRYS ER 20 MEQ PO TBCR
20.0000 meq | EXTENDED_RELEASE_TABLET | Freq: Two times a day (BID) | ORAL | Status: DC
  Administered 2015-08-02 – 2015-08-04 (×5): 20 meq via ORAL
  Filled 2015-08-02 (×5): qty 1

## 2015-08-02 MED ORDER — TORSEMIDE 20 MG PO TABS
60.0000 mg | ORAL_TABLET | Freq: Two times a day (BID) | ORAL | Status: DC
  Administered 2015-08-02 – 2015-08-03 (×4): 60 mg via ORAL
  Filled 2015-08-02 (×4): qty 3

## 2015-08-02 NOTE — Progress Notes (Signed)
Advanced Heart Failure Rounding Note   Subjective:    Remains stable off milrinone. Coox 58.3% this am.  Creatinine trending down.  Out 6 L and down 16 lbs this admission, although CVP 14-15 this am.  Feels OK. Denies CP, lightheadedness, or dizziness.    RHC 08/01/15 RA = 6 RV = 48/0/4 PA = 45/20 (33) PCW = 18  Fick cardiac output/index = 5.7/2.7 PVR = 2.6 WU Ao sat = 97% PA sat = 65%, 67% RA sat = 68%   Objective:   Weight Range:  Vital Signs:   Temp:  [98 F (36.7 C)-99.4 F (37.4 C)] 98.4 F (36.9 C) (04/11 0802) Pulse Rate:  [0-110] 109 (04/11 0802) Resp:  [0-121] 19 (04/11 0802) BP: (96-128)/(66-96) 128/91 mmHg (04/11 0802) SpO2:  [0 %-100 %] 97 % (04/11 0802) Weight:  [180 lb 1.6 oz (81.693 kg)] 180 lb 1.6 oz (81.693 kg) (04/11 0338) Last BM Date: 08/01/15  Weight change: Filed Weights   07/31/15 0400 08/01/15 0448 08/02/15 0338  Weight: 182 lb (82.555 kg) 185 lb 14.4 oz (84.324 kg) 180 lb 1.6 oz (81.693 kg)    Intake/Output:   Intake/Output Summary (Last 24 hours) at 08/02/15 0812 Last data filed at 08/02/15 0700  Gross per 24 hour  Intake    910 ml  Output   1100 ml  Net   -190 ml     Physical Exam: CVP 14-15 General:  No resp difficulty.Sitting in bed HEENT: normal Neck: supple. JVP 10 cm + . Carotids 2+ bilat; no bruits. No thyromegaly or nodule noted Cor: PMI nondisplaced. Regular rate & rhythm. No rubs, gallops or murmurs. Lungs: Clear Abdomen: soft, NT, ND, no HSM. No bruits or masses. +BS  Extremities: no cyanosis, clubbing, rash, 1+ bilateral LE edema. RUE PICC RLE dressing.  Neuro: alert & orientedx3, cranial nerves grossly intact. moves all 4 extremities w/o difficulty. Affect pleasant  Telemetry: Sinus tach  Labs: Basic Metabolic Panel:  Recent Labs Lab 07/26/15 1707  07/29/15 0704 07/30/15 0524 07/31/15 0613 08/01/15 0500 08/01/15 1048 08/02/15 0420  NA 142  < > 140 142 139 139  --  140  K 3.9  < > 4.6 4.2 4.0 3.8   --  4.0  CL 104  < > 102 101 100* 99*  --  101  CO2 26  < > 26 27 26 27   --  26  GLUCOSE 133*  < > 73 148* 170* 187*  --  158*  BUN 19  < > 31* 38* 44* 42*  --  37*  CREATININE 1.70*  < > 1.89* 2.19* 2.50* 2.46* 2.36* 2.22*  CALCIUM 8.6*  < > 8.1* 8.3* 8.6* 8.5*  --  8.4*  MG 2.0  --   --   --   --   --   --   --   < > = values in this interval not displayed.  Liver Function Tests:  Recent Labs Lab 07/26/15 1707 07/28/15 0445  AST 18 18  ALT 11* 10*  ALKPHOS 164* 142*  BILITOT 1.4* 1.1  PROT 6.7 6.0*  ALBUMIN 2.8* 2.4*   No results for input(s): LIPASE, AMYLASE in the last 168 hours. No results for input(s): AMMONIA in the last 168 hours.  CBC:  Recent Labs Lab 07/26/15 1707 07/28/15 0445 07/29/15 0539 08/01/15 1048  WBC 10.2 12.8* 10.9* 8.7  NEUTROABS 8.3*  --   --   --   HGB 12.9* 11.3* 11.6* 10.6*  HCT 40.6 35.9* 36.2* 34.7*  MCV 85.3 83.9 84.4 83.4  PLT 334 350 288 271    Cardiac Enzymes: No results for input(s): CKTOTAL, CKMB, CKMBINDEX, TROPONINI in the last 168 hours.  BNP: BNP (last 3 results)  Recent Labs  04/10/15 1613 04/11/15 0400 07/26/15 1707  BNP 2401.8* 2568.2* 2061.3*    ProBNP (last 3 results) No results for input(s): PROBNP in the last 8760 hours.    Other results:  Imaging: US Renal Port  07/31/2015  CLINICAL DATA:  Acute on chronic renal failure. EXAM: RENAL / URINARY TRACT ULTRASOUND COMPLETE COMPARISON:  12/26/2011 FINDINGS: Right Kidney: Length: 11.9 cm. Moderate hydronephrosis is again noted and appears similar to previous exam. Mild increased parenchymal echogenicity. No mass. Left Kidney: Length: 10.4 cm. Diffuse cortical thinning identified. Moderate hydronephrosis is again identified. Hydroureter is identified. Bladder: Distended thick walled urinary bladder is identified. Other: Ascites is noted. The prostate gland is enlarged measuring 7.3 x 3.7 x 6.7 cm. IMPRESSION: 1. Bilateral moderate hydronephrosis. 2. Thick-walled  urinary bladder suggests chronic bladder outlet obstruction. 3. Prostate gland enlargement 4. Ascites. Electronically Signed   By: Kerby Moors M.D.   On: 07/31/2015 16:34     Medications:     Scheduled Medications: . aspirin EC  81 mg Oral Daily  . digoxin  0.125 mg Oral Daily  . enoxaparin (LOVENOX) injection  40 mg Subcutaneous Q24H  . hydrALAZINE  10 mg Oral 3 times per day  . insulin aspart  0-5 Units Subcutaneous QHS  . insulin aspart  0-9 Units Subcutaneous TID WC  . isosorbide mononitrate  30 mg Oral Daily  . multivitamin with minerals  1 tablet Oral Daily  . rosuvastatin  40 mg Oral q1800  . sodium chloride flush  10-40 mL Intracatheter Q12H  . sodium chloride flush  3 mL Intravenous Q12H  . spironolactone  12.5 mg Oral Daily  . tamsulosin  0.4 mg Oral Daily    Infusions:    PRN Medications: sodium chloride, acetaminophen, ondansetron (ZOFRAN) IV, sodium chloride flush, sodium chloride flush   Assessment/Plan/Discussion    1. Acute on chronic systolic CHF: Ischemic cardiomyopathy. EF 15% on last echo from 12/16. Long history of low EF. Has St Jude ICD.  - Co-ox marginal off milrinone. Will keep off for now.  - Start torsemide at 60 mg BID. - Hydralazine 10 tid + Imdur 30 daily.  - On digoxin, and spironolactone 12.5 daily. Dig level 0.3 08/01/15 - Has been intolerant of beta blockers in the past.  - Holding off on ACEI/ARB for now with elevated creatinine and need for diuresis.  - May be nearing need for advanced therapies but will need to get renal function better and DM2 improved.  - Interrogated St Jude ICD- No VT. Impedance down in March.  - Restart ivabridine at 5 mg BID.  2. CAD: No chest pain. Nonviable LAD territory on cardiac MRI in 2007. Last cath in 8/15 with diffuse severe disease, was medically managed. Suspect unlikely to benefit from revascularization given history and lack of angina.  - Stable ASA and statin.  3. CKD: Stage III.  Follow carefully with diuresis.  4. DM- Remains labile. Now off lantus 5. RLE wound- WOC appreciated.  6. Hypokalemia- Resolved 7. Acute on chronic renal failure, stage 3   --Renal u/s with Bilateral moderate hydronephrosis (chronic), thick-walled urinary bladder suggests chronic bladder outlet Obstruction, Prostate gland enlargement   --Will start flomax. Check PSA. May need urology f/u  Length of Stay: 7  Shirley Friar PA-C 08/02/2015, 8:12 AM   Advanced Heart Failure Team Pager [phone] (M-F; 7a - 4p)  Please contact [facility] for night-coverage after hours (4p -7a ) and weekends on [email or web address]  Patient seen and examined with Oda Kilts, PA-C. We discussed all aspects of the encounter. I agree with the assessment and plan as stated above.   RHC reviewed with him. CVP back up and co-ox remains marginal off milrinone. Will need to continue to work on finging stable home regimen for him. Remains very tachy. Will start torsemide 40 bid and add one dose metolazone. Restart ivabradine 5 bid. Renal function stable.   Bensimhon, Daniel,MD 11:19 AM

## 2015-08-02 NOTE — Progress Notes (Signed)
CARDIAC REHAB PHASE I   PRE:  Rate/Rhythm: 104 ST    BP: sitting 117/80    SaO2: 97 RA  MODE:  Ambulation: 350 ft   POST:  Rate/Rhythm: 120 ST    BP: sitting 127/85     SaO2: 95 RA  Pt able to stand and walk at slow pace without difficulty. To recliner. HR up to 120. Will f/u. [id number]   [place], ACSM 08/02/2015 11:06 AM

## 2015-08-02 NOTE — Evaluation (Signed)
Physical Therapy Evaluation Patient Details Name: [name] [name] MRN: [id number] DOB: [birth year] Today's Date: 08/02/2015   History of Present Illness  [name] [name] is a 70 y/o male with CAD s/p previous anterior MI in [id number], systolic HF EF 0000000, HTN, CVA 2015 with mild right leg weakness, DM2 (last A1c 9.7 in 12/16) CKD stage III (baseline creatine 1.7) and chronic LE wounds followed by wound center. He had large OOH anterior MI in 2005 which he experianced nausea and SOB. No CP. Didn't find out until weeks later. Earlier this week he was evaluated Dr Haroldine Laws. Marked volume overload noted . SOB with exertion. + Orthopnea. No chest pain. Admitted for diuresis.    Clinical Impression  Pt admitted with above diagnosis. Pt currently with functional limitations due to the deficits listed below (see PT Problem List). Pt overall does very well with functional mobility, just had some very slight instability with higher level balance activities, but pt has a cane at home that he uses occasionally. Pt will benefit from one more PT follow up visit before returning home to ensure that he can ascend/descend steps safely to return home (declined stairs today due to fatigue).     Follow Up Recommendations No PT follow up    Equipment Recommendations  None recommended by PT    Recommendations for Other Services       Precautions / Restrictions Precautions Precautions: Fall Restrictions Weight Bearing Restrictions: No      Mobility  Bed Mobility Overal bed mobility: Independent             General bed mobility comments: Able to get out of flat bed with no railing. No physical assistance needed.   Transfers Overall transfer level: Independent Equipment used: None             General transfer comment: Independent with sit to stand.   Ambulation/Gait Ambulation/Gait assistance: Supervision Ambulation Distance (Feet): 350 Feet Assistive device: None Gait Pattern/deviations:  Step-through pattern;Decreased stride length;Narrow base of support Gait velocity: decreased   General Gait Details: Pt has slow steady gait and does not require an assistive device. He does have slight unsteadiness with higher level balance activities but did not experience any LOB throughout activity.   Stairs Stairs:  (Pt declined today due to fatigue. )          Wheelchair Mobility    Modified Rankin (Stroke Patients Only)       Balance Overall balance assessment: Needs assistance Sitting-balance support: Feet supported;No upper extremity supported Sitting balance-Leahy Scale: Normal     Standing balance support: No upper extremity supported Standing balance-Leahy Scale: Good Standing balance comment: Slight unsteadiness with higher level balance activities.                  Standardized Balance Assessment Standardized Balance Assessment : Dynamic Gait Index   Dynamic Gait Index Level Surface: Normal Change in Gait Speed: Mild Impairment Gait with Horizontal Head Turns: Normal Gait with Vertical Head Turns: Normal Gait and Pivot Turn: Mild Impairment Step Over Obstacle: Normal Step Around Obstacles: Normal Steps:  (pt declined today)       Pertinent Vitals/Pain Pain Assessment: No/denies pain  Vitals stable throughout activity on room air.     Home Living Family/patient expects to be discharged to:: Private residence Living Arrangements: Spouse/significant other Available Help at Discharge: Family;Available 24 hours/day Type of Home: House Home Access: Stairs to enter Entrance Stairs-Rails: Right Entrance Stairs-Number of Steps: 3 Home Layout: One level Home  Equipment: Gilford Rile - 2 wheels;Cane - single point;Tub bench Additional Comments: Pt's wife has also been in the hospital the past couple of days and went home today. Pt's daughter is staying with them for the next week or so to assist as needed.     Prior Function Level of Independence:  Independent         Comments: only uses cane in crowds     Hand Dominance   Dominant Hand: Right    Extremity/Trunk Assessment   Upper Extremity Assessment: Overall WFL for tasks assessed           Lower Extremity Assessment: Generalized weakness      Cervical / Trunk Assessment: Normal  Communication   Communication: No difficulties  Cognition Arousal/Alertness: Awake/alert Behavior During Therapy: WFL for tasks assessed/performed Overall Cognitive Status: Within Functional Limits for tasks assessed                      General Comments General comments (skin integrity, edema, etc.): Pt very pleasant and cooperative with therapy.     Exercises        Assessment/Plan    PT Assessment Patient needs continued PT services  PT Diagnosis Generalized weakness   PT Problem List Decreased strength;Cardiopulmonary status limiting activity;Decreased balance  PT Treatment Interventions Gait training;Stair training;Functional mobility training;Therapeutic activities;Therapeutic exercise;Balance training;Patient/family education   PT Goals (Current goals can be found in the Care Plan section) Acute Rehab PT Goals Patient Stated Goal: To go home PT Goal Formulation: With patient Time For Goal Achievement: 08/02/15 Potential to Achieve Goals: Good    Frequency Other (Comment) (1x follow up. )   Barriers to discharge        Co-evaluation               End of Session Equipment Utilized During Treatment: Gait belt Activity Tolerance: Patient tolerated treatment well Patient left: in bed;with call bell/phone within reach;with bed alarm set Nurse Communication: Mobility status         Time: [id number] PT Time Calculation (min) (ACUTE ONLY): 16 min   Charges:   PT Evaluation $PT Eval Moderate Complexity: 1 Procedure     PT G Codes:       [place], SPT Colon Branch 08/02/2015, 5:02 PM

## 2015-08-02 NOTE — Progress Notes (Signed)
Inpatient Diabetes Program Recommendations  AACE/ADA: New Consensus Statement on Inpatient Glycemic Control (2015)  Target Ranges:  Prepandial:   less than 140 mg/dL      Peak postprandial:   less than 180 mg/dL (1-2 hours)      Critically ill patients:  140 - 180 mg/dL   Review of Glycemic Control Results for [name], [name] [name] (MRN [id number]) as of 08/02/2015 12:42  Ref. Range 08/01/2015 08:08 08/01/2015 12:06 08/01/2015 15:44 08/01/2015 21:43 08/02/2015 08:04 08/02/2015 11:42  Glucose-Capillary Latest Ref Range: 65-99 mg/dL 161 (H) 151 (H) 240 (H) 191 (H) 195 (H) 183 (H)   Inpatient Diabetes Program Recommendations: Please consider ordering Lantus 18 units QHS (based on 87.7 kg x 0.2 units).  Thank you, Nani Gasser. Merrel Crabbe, RN, MSN, CDE Inpatient Glycemic Control Team Team Pager [phone] (8am-5pm) 08/02/2015 12:43 PM

## 2015-08-02 NOTE — Progress Notes (Deleted)
CARDIAC REHAB PHASE I   PRE:  Rate/Rhythm: 90 SR with PVCs, 3 bts VT    BP: sitting 130/80    SaO2: 97 RA  MODE:  Ambulation: 170 ft   POST:  Rate/Rhythm: 94 SR with occ PVC    BP: sitting 144/72     SaO2: 97-98 RA  Pt easily up out of bed. Used RW for support/safety. Steady. Pt denied SOB or fatigue, stated he did well. To recliner. Less PVCs walking than in bed. BP taken manually as dinamapp not registering accurately presumably due to PVCs. Will f/u. [id number]  [place], ACSM 08/02/2015 10:17 AM

## 2015-08-02 NOTE — Care Management Important Message (Signed)
Important Message  Patient Details  Name: [name] [name] MRN: [id number] Date of Birth: [birth year]   Medicare Important Message Given:  Yes    Barb Merino Miranda 08/02/2015, 12:14 PM

## 2015-08-03 LAB — BASIC METABOLIC PANEL
Anion gap: 11 (ref 5–15)
BUN: 37 mg/dL — AB (ref 6–20)
CALCIUM: 8.7 mg/dL — AB (ref 8.9–10.3)
CO2: 28 mmol/L (ref 22–32)
CREATININE: 2.17 mg/dL — AB (ref 0.61–1.24)
Chloride: 100 mmol/L — ABNORMAL LOW (ref 101–111)
GFR calc Af Amer: 34 mL/min — ABNORMAL LOW (ref >60)
GFR, EST NON AFRICAN AMERICAN: 29 mL/min — AB (ref >60)
GLUCOSE: 148 mg/dL — AB (ref 65–99)
POTASSIUM: 4.1 mmol/L (ref 3.5–5.1)
SODIUM: 139 mmol/L (ref 135–145)

## 2015-08-03 LAB — GLUCOSE, CAPILLARY
GLUCOSE-CAPILLARY: 244 mg/dL — AB (ref 65–99)
Glucose-Capillary: 142 mg/dL — ABNORMAL HIGH (ref 65–99)
Glucose-Capillary: 242 mg/dL — ABNORMAL HIGH (ref 65–99)
Glucose-Capillary: 185 mg/dL — ABNORMAL HIGH (ref 65–99)
Glucose-Capillary: 155 mg/dL — ABNORMAL HIGH (ref 65–99)

## 2015-08-03 LAB — CARBOXYHEMOGLOBIN
CARBOXYHEMOGLOBIN: 1.3 % (ref 0.5–1.5)
METHEMOGLOBIN: 0.8 % (ref 0.0–1.5)
O2 Saturation: 58.3 %
TOTAL HEMOGLOBIN: 11.5 g/dL — AB (ref 13.5–18.0)

## 2015-08-03 MED ORDER — INSULIN GLARGINE 100 UNIT/ML ~~LOC~~ SOLN
18.0000 [IU] | Freq: Every day | SUBCUTANEOUS | Status: DC
  Administered 2015-08-03 – 2015-08-04 (×2): 18 [IU] via SUBCUTANEOUS
  Filled 2015-08-03 (×2): qty 0.18

## 2015-08-03 MED ORDER — INSULIN ASPART 100 UNIT/ML ~~LOC~~ SOLN
3.0000 [IU] | Freq: Three times a day (TID) | SUBCUTANEOUS | Status: DC
  Administered 2015-08-03: 3 [IU] via SUBCUTANEOUS
  Administered 2015-08-03: 14:00:00 via SUBCUTANEOUS
  Administered 2015-08-04 (×2): 3 [IU] via SUBCUTANEOUS

## 2015-08-03 MED ORDER — HYDRALAZINE HCL 25 MG PO TABS
25.0000 mg | ORAL_TABLET | Freq: Three times a day (TID) | ORAL | Status: DC
  Administered 2015-08-03 – 2015-08-04 (×3): 25 mg via ORAL
  Filled 2015-08-03 (×3): qty 1

## 2015-08-03 NOTE — Progress Notes (Signed)
Spoke w pt. He will have [facility] come out for community care mgt. He would like [facility]na nd [facility]pt w [facility] again. Ref to donna w [facility]. Will await final orders. Poss dc on 4-13

## 2015-08-03 NOTE — Progress Notes (Signed)
CARDIAC REHAB PHASE I   PRE:  Rate/Rhythm: 84 SR  BP:  Sitting: 101/72        SaO2: 97 RA  MODE:  Ambulation: 700 ft   POST:  Rate/Rhythm: 106 ST  BP:  Sitting: 133/79         SaO2: 96 RA  Pt ambulated 700 ft on RA, IV, hand held assist, slow, fairly steady gait, tolerated well, no complaints. Completed CHF education with pt at bedside. Reviewed CHF booklet and zone tool, daily weights, sodium and fluid restrictions, heart healthy and diabetes diet, and phase 2 cardiac rehab. Pt agrees to phase 2 referral, will send to [facility] per pt request. Pt to bed per pt request after walk, call bell within reach. Will follow. PT to see pt this afternoon.   [place], RN, BSN 08/03/2015 12:00 PM

## 2015-08-03 NOTE — Progress Notes (Signed)
Inpatient Diabetes Program Recommendations  AACE/ADA: New Consensus Statement on Inpatient Glycemic Control (2015)  Target Ranges:  Prepandial:   less than 140 mg/dL      Peak postprandial:   less than 180 mg/dL (1-2 hours)      Critically ill patients:  140 - 180 mg/dL   Review of Glycemic Control Results for [name] [name], [name] [name] (MRN [id number]) as of 08/03/2015 11:13  Ref. Range 08/02/2015 08:04 08/02/2015 11:42 08/02/2015 17:09 08/02/2015 21:26 08/03/2015 08:43  Glucose-Capillary Latest Ref Range: 65-99 mg/dL 195 (H) 183 (H) 247 (H) 244 (H) 142 (H)   Diabetes history: DM Type 2 Outpatient Diabetes medications: Lantus 75 QHS and Novolog 5-13 units tidwc Current orders for Inpatient glycemic control: Novolog Sensitive Correction Scale 0-9 tid  Inpatient Diabetes Program Recommendations: Please consider Lantus 18 units daily + 3 units Novolog meal coverage tid (hold if eats < 50%). Discussed with Barrington Ellison PA and will place orders.  Thank you, Nani Gasser. Braylon Lemmons, RN, MSN, CDE Inpatient Glycemic Control Team Team Pager [phone] (8am-5pm) 08/03/2015 11:23 AM

## 2015-08-03 NOTE — Progress Notes (Signed)
Advanced Heart Failure Rounding Note   Subjective:    Remains stable off milrinone. Coox remains stable/marginal at 58.3% this am. Walked with PT and cardiac rehab yesterday. No PT follow up recommended. Denies SOB or CP.  No lightheadedness or dizziness. Continues to diureses with clear/yellow urine.   Out 0.8 L yesterday, down 6 lbs. Creatinine stable to improve.   RHC 08/01/15 RA = 6 RV = 48/0/4 PA = 45/20 (33) PCW = 18 Fick cardiac output/index = 5.7/2.7 PVR = 2.6 WU Ao sat = 97% PA sat = 65%, 67% RA sat = 68%   Objective:   Weight Range:  Vital Signs:   Temp:  [98.1 F (36.7 C)-99.2 F (37.3 C)] 98.3 F (36.8 C) (04/12 0347) Pulse Rate:  [9-109] 88 (04/12 0730) Resp:  [14-30] 19 (04/12 0730) BP: (102-129)/(70-85) 114/78 mmHg (04/12 0730) SpO2:  [91 %-99 %] 96 % (04/12 0730) Weight:  [174 lb 6.1 oz (79.1 kg)] 174 lb 6.1 oz (79.1 kg) (04/12 0347) Last BM Date: 08/02/15  Weight change: Filed Weights   08/01/15 0448 08/02/15 0338 08/03/15 0347  Weight: 185 lb 14.4 oz (84.324 kg) 180 lb 1.6 oz (81.693 kg) 174 lb 6.1 oz (79.1 kg)    Intake/Output:   Intake/Output Summary (Last 24 hours) at 08/03/15 0806 Last data filed at 08/03/15 0730  Gross per 24 hour  Intake 1022.5 ml  Output   1975 ml  Net -952.5 ml     Physical Exam:  CVP 7-8 General:  No resp difficulty. Lying in bed.  HEENT: normal Neck: supple. JVP 7-8 with prominent cv wave. Carotids 2+ bilat; no bruits. No thyromegaly or lymphadenopathy noted Cor: PMI nondisplaced. RRR. No M/G/R Lungs: CTAB, normal effort Abdomen: soft, NT, ND, no HSM. No bruits or masses. +BS  Extremities: no cyanosis, clubbing, rash, 1+ bilateral LE edema. RUE PICC RLE dressing.  Neuro: alert & orientedx3, cranial nerves grossly intact. moves all 4 extremities w/o difficulty. Affect pleasant  Telemetry: NSR 90s  Labs: Basic Metabolic Panel:  Recent Labs Lab 07/30/15 0524 07/31/15 0613 08/01/15 0500  08/01/15 1048 08/02/15 0420 08/03/15 0410  NA 142 139 139  --  140 139  K 4.2 4.0 3.8  --  4.0 4.1  CL 101 100* 99*  --  101 100*  CO2 27 26 27   --  26 28  GLUCOSE 148* 170* 187*  --  158* 148*  BUN 38* 44* 42*  --  37* 37*  CREATININE 2.19* 2.50* 2.46* 2.36* 2.22* 2.17*  CALCIUM 8.3* 8.6* 8.5*  --  8.4* 8.7*    Liver Function Tests:  Recent Labs Lab 07/28/15 0445  AST 18  ALT 10*  ALKPHOS 142*  BILITOT 1.1  PROT 6.0*  ALBUMIN 2.4*   No results for input(s): LIPASE, AMYLASE in the last 168 hours. No results for input(s): AMMONIA in the last 168 hours.  CBC:  Recent Labs Lab 07/28/15 0445 07/29/15 0539 08/01/15 1048  WBC 12.8* 10.9* 8.7  HGB 11.3* 11.6* 10.6*  HCT 35.9* 36.2* 34.7*  MCV 83.9 84.4 83.4  PLT 350 288 271    Cardiac Enzymes: No results for input(s): CKTOTAL, CKMB, CKMBINDEX, TROPONINI in the last 168 hours.  BNP: BNP (last 3 results)  Recent Labs  04/10/15 1613 04/11/15 0400 07/26/15 1707  BNP 2401.8* 2568.2* 2061.3*    ProBNP (last 3 results) No results for input(s): PROBNP in the last 8760 hours.    Other results:  Imaging: No results found.  Medications:     Scheduled Medications: . aspirin EC  81 mg Oral Daily  . digoxin  0.125 mg Oral Daily  . enoxaparin (LOVENOX) injection  40 mg Subcutaneous Q24H  . hydrALAZINE  10 mg Oral 3 times per day  . insulin aspart  0-5 Units Subcutaneous QHS  . insulin aspart  0-9 Units Subcutaneous TID WC  . isosorbide mononitrate  30 mg Oral Daily  . ivabradine  5 mg Oral BID WC  . multivitamin with minerals  1 tablet Oral Daily  . potassium chloride  20 mEq Oral BID  . rosuvastatin  40 mg Oral q1800  . sodium chloride flush  10-40 mL Intracatheter Q12H  . sodium chloride flush  3 mL Intravenous Q12H  . spironolactone  12.5 mg Oral Daily  . tamsulosin  0.4 mg Oral Daily  . torsemide  60 mg Oral BID    Infusions:    PRN Medications: sodium chloride, acetaminophen, ondansetron  (ZOFRAN) IV, sodium chloride flush, sodium chloride flush   Assessment/Plan/Discussion    1. Acute on chronic systolic CHF: Ischemic cardiomyopathy. EF 15% on last echo from 12/16. Long history of low EF. Has St Jude ICD.  - Co-ox remains stable/marginal off milrinone. Will keep off for now.  - Continue torsemide 60 mg BID. Volume status improved.  - Continue hydralazine 10 tid + Imdur 30 daily.  - On digoxin, and spironolactone 12.5 daily. Dig level 0.3 08/01/15 - Has been intolerant of beta blockers in the past.  - Holding off on ACEI/ARB for now with elevated creatinine and need for diuresis.  - May be nearing need for advanced therapies but will need to get renal function better and DM2 improved.  - Interrogated St Jude ICD- No VT. Impedance down in March.  - Continue ivabridine 5 mg BID.  2. CAD: No chest pain. Nonviable LAD territory on cardiac MRI in 2007. Last cath in 8/15 with diffuse severe disease, was medically managed. Suspect unlikely to benefit from revascularization given history and lack of angina. - Stable ASA and statin.  3. CKD: Stage III. Follow carefully with diuresis.  4. DM- Remains labile. Now off lantus 5. RLE wound- WOC appreciated.  6. Hypokalemia- Stable.  7. Acute on chronic renal failure, stage 3   --Renal u/s with Bilateral moderate hydronephrosis (chronic), thick-walled urinary bladder suggests chronic bladder outlet Obstruction, Prostate gland enlargement. Will need Urology f/u.  - Continue flomax. PSA mildly elevated at 4.89. Will need urology f/u   Home today vs tomorrow with continued med adjustment/monitoring as he as at mod/high risk for readmission.   Length of Stay: 8  Shirley Friar PA-C 08/03/2015, 8:06 AM   Advanced Heart Failure Team Pager [phone] (M-F; 7a - 4p)  Please contact [facility] for night-coverage after hours (4p -7a ) and weekends on [email or web address]  Patient seen and examined with Oda Kilts, PA-C. We  discussed all aspects of the encounter. I agree with the assessment and plan as stated above.   Volume status much improved. Co-ox marginal but ok. Renal function stable. Will increase hydralazine to 25 tid. No ACE/ARB due to renal failure. No b-blocker due to low output. HR improved on digoxin and ivabradine. Will continue. Dig level 0.3 on 08/01/15.   Continue PT/CR. Possibly home in am.   Bensimhon, Daniel,MD 2:28 PM

## 2015-08-04 LAB — GLUCOSE, CAPILLARY
GLUCOSE-CAPILLARY: 124 mg/dL — AB (ref 65–99)
GLUCOSE-CAPILLARY: 149 mg/dL — AB (ref 65–99)

## 2015-08-04 LAB — BASIC METABOLIC PANEL
ANION GAP: 14 (ref 5–15)
BUN: 39 mg/dL — ABNORMAL HIGH (ref 6–20)
CALCIUM: 8.6 mg/dL — AB (ref 8.9–10.3)
CO2: 29 mmol/L (ref 22–32)
Chloride: 95 mmol/L — ABNORMAL LOW (ref 101–111)
Creatinine, Ser: 2.41 mg/dL — ABNORMAL HIGH (ref 0.61–1.24)
GFR calc non Af Amer: 26 mL/min — ABNORMAL LOW (ref >60)
GFR, EST AFRICAN AMERICAN: 30 mL/min — AB (ref >60)
GLUCOSE: 127 mg/dL — AB (ref 65–99)
POTASSIUM: 4.3 mmol/L (ref 3.5–5.1)
SODIUM: 138 mmol/L (ref 135–145)

## 2015-08-04 LAB — CARBOXYHEMOGLOBIN
CARBOXYHEMOGLOBIN: 1.7 % — AB (ref 0.5–1.5)
METHEMOGLOBIN: 0.8 % (ref 0.0–1.5)
O2 Saturation: 57.7 %
Total hemoglobin: 12.1 g/dL — ABNORMAL LOW (ref 13.5–18.0)

## 2015-08-04 MED ORDER — TAMSULOSIN HCL 0.4 MG PO CAPS: 0.4000 mg | ORAL_CAPSULE | Freq: Every day | ORAL | Status: DC

## 2015-08-04 MED ORDER — TORSEMIDE 20 MG PO TABS: 40.0000 mg | ORAL_TABLET | Freq: Two times a day (BID) | ORAL | Status: DC

## 2015-08-04 MED ORDER — SPIRONOLACTONE 25 MG PO TABS: 12.5000 mg | ORAL_TABLET | Freq: Every day | ORAL | Status: DC

## 2015-08-04 MED ORDER — DIGOXIN 125 MCG PO TABS: 0.1250 mg | ORAL_TABLET | Freq: Every day | ORAL | Status: DC

## 2015-08-04 MED ORDER — ISOSORBIDE MONONITRATE ER 30 MG PO TB24: 30.0000 mg | ORAL_TABLET | Freq: Every day | ORAL | Status: DC

## 2015-08-04 MED ORDER — INSULIN GLARGINE 100 UNIT/ML ~~LOC~~ SOLN: 18.0000 [IU] | Freq: Every day | SUBCUTANEOUS | Status: DC

## 2015-08-04 MED ORDER — HYDRALAZINE HCL 25 MG PO TABS: 25.0000 mg | ORAL_TABLET | Freq: Three times a day (TID) | ORAL | Status: DC

## 2015-08-04 MED ORDER — POTASSIUM CHLORIDE CRYS ER 20 MEQ PO TBCR: 20.0000 meq | EXTENDED_RELEASE_TABLET | Freq: Two times a day (BID) | ORAL | Status: DC

## 2015-08-04 MED ORDER — IVABRADINE HCL 5 MG PO TABS: 5.0000 mg | ORAL_TABLET | Freq: Two times a day (BID) | ORAL | Status: DC

## 2015-08-04 NOTE — Progress Notes (Signed)
Discharge teaching complete with both pt and his daughter at bedside. Medication list reviewed in detail and both pt and daughter verbalize understanding. Pt is aware of next MD visits and will plan on following up with Heart failure clinic. Informed patient to call MD if any changes or questions.

## 2015-08-04 NOTE — Progress Notes (Signed)
Physical Therapy Treatment Patient Details Name: [name] [name] MRN: [id number] DOB: 26-Apr-1945 Today's Date: 08/04/2015    History of Present Illness [name] [name] is a 70 y/o male with CAD s/p previous anterior MI in [id number], systolic HF EF 0000000, HTN, CVA 2015 with mild right leg weakness, DM2 (last A1c 9.7 in 12/16) CKD stage III (baseline creatine 1.7) and chronic LE wounds followed by wound center. He had large OOH anterior MI in 2005 which he experianced nausea and SOB. No CP. Didn't find out until weeks later. Earlier this week he was evaluated Dr [name] [name]. Marked volume overload noted . SOB with exertion. + Orthopnea. No chest pain. Admitted for diuresis.    PT Comments    Patient mobilizing well, ambulated increased distance in hall without assist, tolerated stair negotiation. Anticipate patient will be safe for d/c home. VSS throughout session.  Follow Up Recommendations  No PT follow up     Equipment Recommendations  None recommended by PT    Recommendations for Other Services       Precautions / Restrictions Precautions Precautions: Fall Restrictions Weight Bearing Restrictions: No    Mobility  Bed Mobility Overal bed mobility: Independent             General bed mobility comments: no physical assist required  Transfers Overall transfer level: Independent Equipment used: None             General transfer comment: Independent with sit to stand.   Ambulation/Gait Ambulation/Gait assistance: Supervision Ambulation Distance (Feet): 640 Feet Assistive device: None Gait Pattern/deviations: Step-through pattern;Decreased stride length;Narrow base of support Gait velocity: decreased   General Gait Details: initially decreased but improved with cues, modest instability no physical assist required   Stairs Stairs: Yes Stairs assistance: Supervision Stair Management: One rail Right;Step to pattern Number of Stairs: 4 General stair comments: steady with  performance of stair negotiation  Wheelchair Mobility    Modified Rankin (Stroke Patients Only)       Balance     Sitting balance-Leahy Scale: Normal       Standing balance-Leahy Scale: Good                      Cognition Arousal/Alertness: Awake/alert Behavior During Therapy: WFL for tasks assessed/performed Overall Cognitive Status: Within Functional Limits for tasks assessed                      Exercises      General Comments        Pertinent Vitals/Pain Pain Assessment: No/denies pain    Home Living                      Prior Function            PT Goals (current goals can now be found in the care plan section) Acute Rehab PT Goals Patient Stated Goal: To go home PT Goal Formulation: With patient Time For Goal Achievement: 08/02/15 Potential to Achieve Goals: Good Progress towards PT goals: Progressing toward goals    Frequency       PT Plan Current plan remains appropriate    Co-evaluation             End of Session Equipment Utilized During Treatment: Gait belt Activity Tolerance: Patient tolerated treatment well Patient left: in chair;with call bell/phone within reach     Time: 0914-0931 PT Time Calculation (min) (ACUTE ONLY): 17 min  Charges:  $Gait Training:  8-22 mins                    G CodesDuncan [name] 08/29/2015, 3:54 PM [name] [name], [place] DPT  [phone]

## 2015-08-04 NOTE — Progress Notes (Signed)
Advanced Heart Failure Rounding Note   Subjective:    Coox remains marginal at 57.7% this am. Has been referred to phase 2 referral for Cardiac Rehab in [place]. Not have SOB walking around unit with CR.   No lightheadedness or dizziness. Continues to diureses with clear/yellow urine.   Out 2 L yesterday, weight shows down another 7 lbs. Creatinine bumped 2.17 -> 2.41.   RHC 08/01/15 RA = 6 RV = 48/0/4 PA = 45/20 (33) PCW = 18 Fick cardiac output/index = 5.7/2.7 PVR = 2.6 WU Ao sat = 97% PA sat = 65%, 67% RA sat = 68%   Objective:   Weight Range:  Vital Signs:   Temp:  [97.9 F (36.6 C)-99.8 F (37.7 C)] 98.6 F (37 C) (04/13 0300) Pulse Rate:  [86-92] 90 (04/13 0500) Resp:  [12-26] 12 (04/13 0500) BP: (96-133)/(60-87) 128/87 mmHg (04/13 0502) SpO2:  [95 %-100 %] 97 % (04/13 0500) Weight:  [167 lb 8 oz (75.978 kg)] 167 lb 8 oz (75.978 kg) (04/13 0335) Last BM Date: 08/02/15  Weight change: Filed Weights   08/02/15 0338 08/03/15 0347 08/04/15 0335  Weight: 180 lb 1.6 oz (81.693 kg) 174 lb 6.1 oz (79.1 kg) 167 lb 8 oz (75.978 kg)    Intake/Output:   Intake/Output Summary (Last 24 hours) at 08/04/15 0733 Last data filed at 08/04/15 0500  Gross per 24 hour  Intake    840 ml  Output   2900 ml  Net  -2060 ml     Physical Exam:  CVP 7-8 General:  No resp difficulty. Lying in bed.  HEENT: normal Neck: supple. JVP 7-8 with prominent cv wave. Carotids 2+ bilat; no bruits. No thyromegaly or lymphadenopathy noted Cor: PMI nondisplaced. RRR. No M/G/R Lungs: CTAB, normal effort Abdomen: soft, NT, ND, no HSM. No bruits or masses. +BS  Extremities: no cyanosis, clubbing, rash, 1+ bilateral LE edema. RUE PICC RLE dressing.  Neuro: alert & orientedx3, cranial nerves grossly intact. moves all 4 extremities w/o difficulty. Affect pleasant  Telemetry: NSR 90s  Labs: Basic Metabolic Panel:  Recent Labs Lab 07/31/15 0613 08/01/15 0500 08/01/15 1048  08/02/15 0420 08/03/15 0410 08/04/15 0500  NA 139 139  --  140 139 138  K 4.0 3.8  --  4.0 4.1 4.3  CL 100* 99*  --  101 100* 95*  CO2 26 27  --  26 28 29   GLUCOSE 170* 187*  --  158* 148* 127*  BUN 44* 42*  --  37* 37* 39*  CREATININE 2.50* 2.46* 2.36* 2.22* 2.17* 2.41*  CALCIUM 8.6* 8.5*  --  8.4* 8.7* 8.6*    Liver Function Tests: No results for input(s): AST, ALT, ALKPHOS, BILITOT, PROT, ALBUMIN in the last 168 hours. No results for input(s): LIPASE, AMYLASE in the last 168 hours. No results for input(s): AMMONIA in the last 168 hours.  CBC:  Recent Labs Lab 07/29/15 0539 08/01/15 1048  WBC 10.9* 8.7  HGB 11.6* 10.6*  HCT 36.2* 34.7*  MCV 84.4 83.4  PLT 288 271    Cardiac Enzymes: No results for input(s): CKTOTAL, CKMB, CKMBINDEX, TROPONINI in the last 168 hours.  BNP: BNP (last 3 results)  Recent Labs  04/10/15 1613 04/11/15 0400 07/26/15 1707  BNP 2401.8* 2568.2* 2061.3*    ProBNP (last 3 results) No results for input(s): PROBNP in the last 8760 hours.    Other results:  Imaging: No results found.   Medications:     Scheduled Medications: .  aspirin EC  81 mg Oral Daily  . digoxin  0.125 mg Oral Daily  . enoxaparin (LOVENOX) injection  40 mg Subcutaneous Q24H  . hydrALAZINE  25 mg Oral 3 times per day  . insulin aspart  0-5 Units Subcutaneous QHS  . insulin aspart  0-9 Units Subcutaneous TID WC  . insulin aspart  3 Units Subcutaneous TID WC  . insulin glargine  18 Units Subcutaneous Daily  . isosorbide mononitrate  30 mg Oral Daily  . ivabradine  5 mg Oral BID WC  . multivitamin with minerals  1 tablet Oral Daily  . potassium chloride  20 mEq Oral BID  . rosuvastatin  40 mg Oral q1800  . sodium chloride flush  10-40 mL Intracatheter Q12H  . sodium chloride flush  3 mL Intravenous Q12H  . spironolactone  12.5 mg Oral Daily  . tamsulosin  0.4 mg Oral Daily  . torsemide  60 mg Oral BID    Infusions:    PRN Medications: sodium  chloride, acetaminophen, ondansetron (ZOFRAN) IV, sodium chloride flush, sodium chloride flush   Assessment/Plan/Discussion    1. Acute on chronic systolic CHF: Ischemic cardiomyopathy. EF 15% on last echo from 12/16. Long history of low EF. Has St Jude ICD.  - Co-ox remains stable/marginal off milrinone. Will keep off for now.  - Hold morning torsemide and decrease to 40 mg BID. Think he is on dry side. CVP 2-3.  - Continue hydralazine 25 tid + Imdur 30 daily.  - On digoxin, and spironolactone 12.5 daily. Dig level 0.3 08/01/15 - Has been intolerant of beta blockers in the past.  - Holding off on ACEI/ARB for now with elevated creatinine and need for diuresis.  - May be nearing need for advanced therapies but will need to get renal function better and DM2 improved.  - Interrogated St Jude ICD- No VT. Impedance down in March.  - Continue ivabridine 5 mg BID.  2. CAD: No chest pain. Nonviable LAD territory on cardiac MRI in 2007. Last cath in 8/15 with diffuse severe disease, was medically managed. Suspect unlikely to benefit from revascularization given history and lack of angina. - Stable on ASA and statin.  3. CKD: Stage III.  - Up slightly. Med changes as above.  4. DM - Appreciate diabetes coordinator input.  5. RLE wound- WOC appreciated.  6. Hypokalemia- Stable.  7. Acute on chronic renal failure, stage 3   --Renal u/s with Bilateral moderate hydronephrosis (chronic), thick-walled urinary bladder suggests chronic bladder outlet Obstruction, Prostate gland enlargement. Will need Urology f/u.  - Continue flomax. PSA mildly elevated at 4.89. Will need urology f/u   Home later today vs tomorrow am with continued med adjustment/monitoring as he as at mod/high risk for readmission.   Length of Stay: [address]  Shirley Friar PA-C 08/04/2015, 7:33 AM   Advanced Heart Failure Team Pager [phone] (M-F; 7a - 4p)  Please contact [facility] for night-coverage after  hours (4p -7a ) and weekends on [email or web address]  Patient seen and examined with Oda Kilts, PA-C. We discussed all aspects of the encounter. I agree with the assessment and plan as stated above.   Much improved. Can go home today with close outpatient f/u. Need to recheck BMET on Monday as we may need to cut torsemide dose back. Reinforced need for daily weights and reviewed use of sliding scale diuretics.  Will also need Urology f/u as outpatient.   Jayni Prescher,MD 10:15 AM

## 2015-08-04 NOTE — Discharge Summary (Addendum)
Advanced Heart Failure Discharge Note   Discharge Summary   Patient ID: [name] [name] MRN: [id number], DOB/AGE: 05-11-1945 70 y.o. Admit date: 07/26/2015 D/C date:     08/04/2015   Primary Discharge Diagnoses:  1. Acute on chronic systolic CHF 2. CAD 3. CKD stage III 4. DM 5. RLE wound - full thickness stasis ulcer (evaluated by Wound Care team) 6. Hypokalemia - resolved 7. Chronic bladder outlet obstruction with elevated PSA.  Consults: WOC, Diabetes coordinator  Hospital Course:  [name] [name] is a 70 y/o male with CAD s/p previous anterior MI in [id number], systolic HF EF 0000000, HTN, CVA 2015 with mild right leg weakness, DM2 (last A1c 9.7 in 12/16) CKD stage III (baseline creatine 1.7) and chronic LE wounds followed by wound center.  Admitted with marked volume overload after being seen in HF clinic the previous day.   Initially diuresed on 80 mg IV BID and metolazone 2.5 mg BID. Coox 72% once PICC placed without inoptropic support. Started on milrinone empirically with sluggish urine output despite high dose IV lasix and zaroxolyn.  Diuresis improved but creatinine bumped.  Transitioned to po torsemide 07/30/15. Held 07/31/15 with continued rise and RHC performed.  Milrinone weaned off.  [name] [name] 08/01/15 with OK volume status, mild pulmonary HTN, and normal CO off of milrinone.  CVP did increase on 08/02/15 but continued to diurese well on torsemide with a dose of metolazone.   He improved functionally throughout his stay. Cardiac rehab followed and he has been set up for Phase 2 rehab as an outpatient.   Hospital coarse complicated by hypoglycemia and urinary retention. Had hypoglycemia so lantus cut back and eventually stopped. WOC saw for his chronic LE wounds. K replaced aggressively with HypokalemiaDiabetes coordinator followed in hospital. Appreciated recommendations.  Will discharge on their recommended treatment with close PCP follow up.  Renal u/s with Bilateral moderate hydronephrosis  (chronic), thick-walled urinary bladder suggests chronic bladder outlet. PSA mildly elevated. Will need urology follow up.  Arranged as below.   He will also have [place] via [place].   Overall he diuresed 9.6 L and weight down 29 lbs. He will be discharged home in stable condition with close follow up with labs early next week and clinic visit the following as below. Felt not to be a candidate for advanced therapies due to renal function and previous noncompliance.  Discharge Weight Range: 167 lb Discharge Vitals: Blood pressure 109/76, pulse 91, temperature 98.5 F (36.9 C), temperature source Oral, resp. rate 18, height 6\' 2"  (1.88 m), weight 167 lb 8 oz (75.978 kg), SpO2 100 %.  Labs: Lab Results  Component Value Date   WBC 8.7 08/01/2015   HGB 10.6* 08/01/2015   HCT 34.7* 08/01/2015   MCV 83.4 08/01/2015   PLT 271 08/01/2015     Recent Labs Lab 08/04/15 0500  NA 138  K 4.3  CL 95*  CO2 29  BUN 39*  CREATININE 2.41*  CALCIUM 8.6*  GLUCOSE 127*   Lab Results  Component Value Date   CHOL 120 03/09/2014   HDL 39* 03/09/2014   LDLCALC 56 03/09/2014   TRIG 123 03/09/2014   BNP (last 3 results)  Recent Labs  04/10/15 1613 04/11/15 0400 07/26/15 1707  BNP 2401.8* 2568.2* 2061.3*    ProBNP (last 3 results) No results for input(s): PROBNP in the last 8760 hours.   Diagnostic Studies/Procedures   RHC 08/01/15 RA = 6 RV = 48/0/4 PA = 45/20 (33) PCW = 18  Fick cardiac  output/index = 5.7/2.7 PVR = 2.6 WU Ao sat = 97% PA sat = 65%, 67% RA sat = 68%  Discharge Medications     Medication List    TAKE these medications        aspirin EC 81 MG tablet  Take 81 mg by mouth daily.     digoxin 0.125 MG tablet  Commonly known as:  LANOXIN  Take 1 tablet (0.125 mg total) by mouth daily.     hydrALAZINE 25 MG tablet  Commonly known as:  APRESOLINE  Take 1 tablet (25 mg total) by mouth 3 (three) times daily.     insulin aspart 100 UNIT/ML FlexPen  Commonly known  as:  NOVOLOG  5-13 units 3 times daily     insulin glargine 100 UNIT/ML injection  Commonly known as:  LANTUS  Inject 0.18 mLs (18 Units total) into the skin at bedtime.     Insulin Pen Needle 32G X 4 MM Misc  Commonly known as:  BD PEN NEEDLE NANO U/F  Test once daily dx e10.9     isosorbide mononitrate 30 MG 24 hr tablet  Commonly known as:  IMDUR  Take 1 tablet (30 mg total) by mouth daily.     ivabradine 5 MG Tabs tablet  Commonly known as:  CORLANOR  Take 1 tablet (5 mg total) by mouth 2 (two) times daily with a meal.     Magnesium 250 MG Tabs  Take one tablet by mouth every other day     multivitamin with minerals tablet  Take 1 tablet by mouth daily.     potassium chloride SA 20 MEQ tablet  Commonly known as:  K-DUR,KLOR-CON  Take 1 tablet (20 mEq total) by mouth 2 (two) times daily.     rosuvastatin 40 MG tablet  Commonly known as:  CRESTOR  Take 1 tablet (40 mg total) by mouth daily.     spironolactone 25 MG tablet  Commonly known as:  ALDACTONE  Take 0.5 tablets (12.5 mg total) by mouth daily.     tamsulosin 0.4 MG Caps capsule  Commonly known as:  FLOMAX  Take 1 capsule (0.4 mg total) by mouth daily.     torsemide 20 MG tablet  Commonly known as:  DEMADEX  Take 2 tablets (40 mg total) by mouth 2 (two) times daily.        Disposition   The patient will be discharged in stable condition to home. Discharge Instructions    AMB Referral to [facility] Care Management    Complete by:  As directed   Reason for consult:  Post hospital monitoring  Diagnoses of:  Heart Failure  Expected date of contact:  1-3 days (reserved for hospital discharges)  Please assign to community nurse for transition of care calls and assess for home visits. HF exacerbation.  Questions please call:   Natividad Brood, RN BSN [facility] Liaison  [phone] business mobile phone Toll free office [phone]     Amb Referral to Cardiac Rehabilitation    Complete  by:  As directed   Diagnosis:  Heart Failure (see criteria below if ordering Phase II)  Heart Failure Type:  Chronic Systolic     Diet - low sodium heart healthy    Complete by:  As directed      Heart Failure patients record your daily weight using the same scale at the same time of day    Complete by:  As directed  Increase activity slowly    Complete by:  As directed           Follow-up Information    Follow up with [facility] On 08/09/2015.   Specialty:  Cardiology   Why:  at 1000 for labs.  BMET   Contact information:   [address] [id number] [place] [phone]      Follow up with [facility] On 08/16/2015.   Specialty:  Cardiology   Why:  at 1120 for post hospital follow up. Please bring all of your medications to your visit. The code for parking is 2000.   Contact information:   [address] [id number] [place] [phone]      Follow up with [facility] On 09/14/2015.   Why:  at 0900 for urology work up.    Contact information:   [place] [phone]      Follow up with Drema Pry, DO. Schedule an appointment as soon as possible for a visit in 1 week.   Specialty:  Internal Medicine   Why:  call and make an appointment.   Contact information:   [place] [address] [phone]         Duration of Discharge Encounter: Greater than 35 minutes   Signed, Shirley Friar PA-C 08/04/2015, 3:46 PM   Patient seen and examined with Oda Kilts, PA-C. We discussed all aspects of the encounter. I agree with the assessment and plan as stated above.   [place] for discharge. Needs close f/u in HF Clinic.   Bensimhon, Daniel,MD 12:05 AM

## 2015-08-04 NOTE — Progress Notes (Signed)
Per phone message from Respiratory Therapist this AM, patient's O2 saturation from COx reading is 57.7. Will continue to monitor patient. Gildardo Cranker, RN

## 2015-08-04 NOTE — Progress Notes (Signed)
CARDIAC REHAB PHASE I   PRE:  Rate/Rhythm: 92 SR    BP: sitting 93/67    SaO2:   MODE:  Ambulation: 350 ft   POST:  Rate/Rhythm: 103 ST    BP: sitting 109/76     SaO2:   Tolerated well with slow pace. Declined walking farther but did not have any reason. He did walk with PT earlier and did stairs. Reviewed ed. Anthony, ACSM 08/04/2015 11:34 AM

## 2015-08-04 NOTE — Progress Notes (Signed)
CM received a call from Butch Penny with [facility] stating that a referral for [facility] services was received on 08/03/15, but no orders were placed. CM contacted [place], [place] for the Heart Failure team for orders.  Orders were placed and Butch Penny with [facility] was notified.    Lorne Skeens RN, MSN [phone]

## 2015-08-05 ENCOUNTER — Other Ambulatory Visit: Payer: Self-pay

## 2015-08-05 DIAGNOSIS — I129 Hypertensive chronic kidney disease with stage 1 through stage 4 chronic kidney disease, or unspecified chronic kidney disease: Secondary | ICD-10-CM | POA: Diagnosis not present

## 2015-08-05 DIAGNOSIS — I5043 Acute on chronic combined systolic (congestive) and diastolic (congestive) heart failure: Secondary | ICD-10-CM | POA: Diagnosis not present

## 2015-08-05 DIAGNOSIS — E1140 Type 2 diabetes mellitus with diabetic neuropathy, unspecified: Secondary | ICD-10-CM | POA: Diagnosis not present

## 2015-08-05 DIAGNOSIS — I255 Ischemic cardiomyopathy: Secondary | ICD-10-CM | POA: Diagnosis not present

## 2015-08-05 DIAGNOSIS — L97211 Non-pressure chronic ulcer of right calf limited to breakdown of skin: Secondary | ICD-10-CM | POA: Diagnosis not present

## 2015-08-05 DIAGNOSIS — N183 Chronic kidney disease, stage 3 (moderate): Secondary | ICD-10-CM | POA: Diagnosis not present

## 2015-08-05 DIAGNOSIS — I2510 Atherosclerotic heart disease of native coronary artery without angina pectoris: Secondary | ICD-10-CM | POA: Diagnosis not present

## 2015-08-05 DIAGNOSIS — I878 Other specified disorders of veins: Secondary | ICD-10-CM | POA: Diagnosis not present

## 2015-08-05 DIAGNOSIS — E1122 Type 2 diabetes mellitus with diabetic chronic kidney disease: Secondary | ICD-10-CM | POA: Diagnosis not present

## 2015-08-05 NOTE — Patient Outreach (Signed)
[facility]) Care Management  08/05/2015  RENOLD SUITE 1945/10/12 [id number]  70 year old with recent admission for heart failure. Transition of care call complete. Member reports he is doing well. Denies any shortness of breath, denies any edema. Member reports he has not been contacted by home health, but expects to hear from them today. Member reports he has their contact number. No issues or concerns at this time.  Plan: continue to follow for weekly transition of care engagement.  [name], RN, MSN, [place] Coordinator Cell: [phone]

## 2015-08-08 ENCOUNTER — Other Ambulatory Visit: Payer: Self-pay

## 2015-08-08 NOTE — Patient Outreach (Signed)
[facility]) Care Management  08/08/2015  [name] [name] 1945-09-08 [id number]  Subjective: "I am in the green zone".  Assessment: RNCM called for transition of care. Member denies any difficulties at this time. He continues to weigh daily. Member states his weight today is 165 pounds and reports his appetite is good.  Plan: continue weekly transition of care engagements.  [name], RN, MSN, [place] Coordinator Cell: [phone]

## 2015-08-09 ENCOUNTER — Other Ambulatory Visit (HOSPITAL_COMMUNITY): Payer: Commercial Managed Care - HMO

## 2015-08-09 DIAGNOSIS — L97211 Non-pressure chronic ulcer of right calf limited to breakdown of skin: Secondary | ICD-10-CM | POA: Diagnosis not present

## 2015-08-09 DIAGNOSIS — N183 Chronic kidney disease, stage 3 (moderate): Secondary | ICD-10-CM | POA: Diagnosis not present

## 2015-08-09 DIAGNOSIS — I878 Other specified disorders of veins: Secondary | ICD-10-CM | POA: Diagnosis not present

## 2015-08-09 DIAGNOSIS — I5031 Acute diastolic (congestive) heart failure: Secondary | ICD-10-CM | POA: Diagnosis not present

## 2015-08-09 DIAGNOSIS — I255 Ischemic cardiomyopathy: Secondary | ICD-10-CM | POA: Diagnosis not present

## 2015-08-09 DIAGNOSIS — I2510 Atherosclerotic heart disease of native coronary artery without angina pectoris: Secondary | ICD-10-CM | POA: Diagnosis not present

## 2015-08-09 DIAGNOSIS — E1140 Type 2 diabetes mellitus with diabetic neuropathy, unspecified: Secondary | ICD-10-CM | POA: Diagnosis not present

## 2015-08-09 DIAGNOSIS — I129 Hypertensive chronic kidney disease with stage 1 through stage 4 chronic kidney disease, or unspecified chronic kidney disease: Secondary | ICD-10-CM | POA: Diagnosis not present

## 2015-08-09 DIAGNOSIS — I5043 Acute on chronic combined systolic (congestive) and diastolic (congestive) heart failure: Secondary | ICD-10-CM | POA: Diagnosis not present

## 2015-08-09 DIAGNOSIS — E1122 Type 2 diabetes mellitus with diabetic chronic kidney disease: Secondary | ICD-10-CM | POA: Diagnosis not present

## 2015-08-10 ENCOUNTER — Telehealth (HOSPITAL_COMMUNITY): Payer: Self-pay | Admitting: Cardiology

## 2015-08-10 DIAGNOSIS — N183 Chronic kidney disease, stage 3 (moderate): Secondary | ICD-10-CM | POA: Diagnosis not present

## 2015-08-10 DIAGNOSIS — I255 Ischemic cardiomyopathy: Secondary | ICD-10-CM | POA: Diagnosis not present

## 2015-08-10 DIAGNOSIS — I878 Other specified disorders of veins: Secondary | ICD-10-CM | POA: Diagnosis not present

## 2015-08-10 DIAGNOSIS — I2510 Atherosclerotic heart disease of native coronary artery without angina pectoris: Secondary | ICD-10-CM | POA: Diagnosis not present

## 2015-08-10 DIAGNOSIS — E1122 Type 2 diabetes mellitus with diabetic chronic kidney disease: Secondary | ICD-10-CM | POA: Diagnosis not present

## 2015-08-10 DIAGNOSIS — L97211 Non-pressure chronic ulcer of right calf limited to breakdown of skin: Secondary | ICD-10-CM | POA: Diagnosis not present

## 2015-08-10 DIAGNOSIS — I129 Hypertensive chronic kidney disease with stage 1 through stage 4 chronic kidney disease, or unspecified chronic kidney disease: Secondary | ICD-10-CM | POA: Diagnosis not present

## 2015-08-10 DIAGNOSIS — E1140 Type 2 diabetes mellitus with diabetic neuropathy, unspecified: Secondary | ICD-10-CM | POA: Diagnosis not present

## 2015-08-10 DIAGNOSIS — I5043 Acute on chronic combined systolic (congestive) and diastolic (congestive) heart failure: Secondary | ICD-10-CM | POA: Diagnosis not present

## 2015-08-10 MED ORDER — POTASSIUM CHLORIDE CRYS ER 20 MEQ PO TBCR: 20.0000 meq | EXTENDED_RELEASE_TABLET | Freq: Every day | ORAL | Status: DC

## 2015-08-10 NOTE — Telephone Encounter (Signed)
ABNORMAL LABS   K 5.1 PER ANDY TILLERY, PA PATIENT SHOULD DECREASE POTASSIUM TO 20 MEG DAILY AND RECHECK AT FOLLOW UP NEXT WEEK PATIENT AWARE AND VOICED UNDERSTANDING

## 2015-08-11 DIAGNOSIS — I878 Other specified disorders of veins: Secondary | ICD-10-CM | POA: Diagnosis not present

## 2015-08-11 DIAGNOSIS — I2510 Atherosclerotic heart disease of native coronary artery without angina pectoris: Secondary | ICD-10-CM | POA: Diagnosis not present

## 2015-08-11 DIAGNOSIS — E1140 Type 2 diabetes mellitus with diabetic neuropathy, unspecified: Secondary | ICD-10-CM | POA: Diagnosis not present

## 2015-08-11 DIAGNOSIS — L97211 Non-pressure chronic ulcer of right calf limited to breakdown of skin: Secondary | ICD-10-CM | POA: Diagnosis not present

## 2015-08-11 DIAGNOSIS — I129 Hypertensive chronic kidney disease with stage 1 through stage 4 chronic kidney disease, or unspecified chronic kidney disease: Secondary | ICD-10-CM | POA: Diagnosis not present

## 2015-08-11 DIAGNOSIS — E1122 Type 2 diabetes mellitus with diabetic chronic kidney disease: Secondary | ICD-10-CM | POA: Diagnosis not present

## 2015-08-11 DIAGNOSIS — N183 Chronic kidney disease, stage 3 (moderate): Secondary | ICD-10-CM | POA: Diagnosis not present

## 2015-08-11 DIAGNOSIS — I255 Ischemic cardiomyopathy: Secondary | ICD-10-CM | POA: Diagnosis not present

## 2015-08-11 DIAGNOSIS — I5043 Acute on chronic combined systolic (congestive) and diastolic (congestive) heart failure: Secondary | ICD-10-CM | POA: Diagnosis not present

## 2015-08-12 ENCOUNTER — Other Ambulatory Visit (HOSPITAL_COMMUNITY): Payer: Self-pay | Admitting: Internal Medicine

## 2015-08-12 DIAGNOSIS — I129 Hypertensive chronic kidney disease with stage 1 through stage 4 chronic kidney disease, or unspecified chronic kidney disease: Secondary | ICD-10-CM | POA: Diagnosis not present

## 2015-08-12 DIAGNOSIS — I5043 Acute on chronic combined systolic (congestive) and diastolic (congestive) heart failure: Secondary | ICD-10-CM | POA: Diagnosis not present

## 2015-08-12 DIAGNOSIS — L97211 Non-pressure chronic ulcer of right calf limited to breakdown of skin: Secondary | ICD-10-CM | POA: Diagnosis not present

## 2015-08-12 DIAGNOSIS — E1122 Type 2 diabetes mellitus with diabetic chronic kidney disease: Secondary | ICD-10-CM | POA: Diagnosis not present

## 2015-08-12 DIAGNOSIS — N183 Chronic kidney disease, stage 3 (moderate): Secondary | ICD-10-CM | POA: Diagnosis not present

## 2015-08-12 DIAGNOSIS — I255 Ischemic cardiomyopathy: Secondary | ICD-10-CM | POA: Diagnosis not present

## 2015-08-12 DIAGNOSIS — I878 Other specified disorders of veins: Secondary | ICD-10-CM | POA: Diagnosis not present

## 2015-08-12 DIAGNOSIS — E1140 Type 2 diabetes mellitus with diabetic neuropathy, unspecified: Secondary | ICD-10-CM | POA: Diagnosis not present

## 2015-08-12 DIAGNOSIS — I2510 Atherosclerotic heart disease of native coronary artery without angina pectoris: Secondary | ICD-10-CM | POA: Diagnosis not present

## 2015-08-15 DIAGNOSIS — I255 Ischemic cardiomyopathy: Secondary | ICD-10-CM | POA: Diagnosis not present

## 2015-08-15 DIAGNOSIS — E1140 Type 2 diabetes mellitus with diabetic neuropathy, unspecified: Secondary | ICD-10-CM | POA: Diagnosis not present

## 2015-08-15 DIAGNOSIS — N183 Chronic kidney disease, stage 3 (moderate): Secondary | ICD-10-CM | POA: Diagnosis not present

## 2015-08-15 DIAGNOSIS — L97211 Non-pressure chronic ulcer of right calf limited to breakdown of skin: Secondary | ICD-10-CM | POA: Diagnosis not present

## 2015-08-15 DIAGNOSIS — E1122 Type 2 diabetes mellitus with diabetic chronic kidney disease: Secondary | ICD-10-CM | POA: Diagnosis not present

## 2015-08-15 DIAGNOSIS — I5043 Acute on chronic combined systolic (congestive) and diastolic (congestive) heart failure: Secondary | ICD-10-CM | POA: Diagnosis not present

## 2015-08-15 DIAGNOSIS — I129 Hypertensive chronic kidney disease with stage 1 through stage 4 chronic kidney disease, or unspecified chronic kidney disease: Secondary | ICD-10-CM | POA: Diagnosis not present

## 2015-08-15 DIAGNOSIS — I878 Other specified disorders of veins: Secondary | ICD-10-CM | POA: Diagnosis not present

## 2015-08-15 DIAGNOSIS — I2510 Atherosclerotic heart disease of native coronary artery without angina pectoris: Secondary | ICD-10-CM | POA: Diagnosis not present

## 2015-08-16 ENCOUNTER — Ambulatory Visit (HOSPITAL_COMMUNITY)
Admit: 2015-08-16 | Discharge: 2015-08-16 | Disposition: A | Discharge status: Home / Self Care | Payer: Commercial Managed Care - HMO | Source: Ambulatory Visit | Attending: Internal Medicine | Admitting: Internal Medicine

## 2015-08-16 ENCOUNTER — Other Ambulatory Visit: Payer: Self-pay | Admitting: Internal Medicine

## 2015-08-16 ENCOUNTER — Ambulatory Visit: Payer: Commercial Managed Care - HMO | Admitting: Family Medicine

## 2015-08-16 ENCOUNTER — Ambulatory Visit: Payer: Commercial Managed Care - HMO

## 2015-08-16 VITALS — BP 110/66 | HR 87 | Wt 164.0 lb

## 2015-08-16 DIAGNOSIS — Z794 Long term (current) use of insulin: Secondary | ICD-10-CM | POA: Diagnosis not present

## 2015-08-16 DIAGNOSIS — I255 Ischemic cardiomyopathy: Secondary | ICD-10-CM | POA: Diagnosis not present

## 2015-08-16 DIAGNOSIS — E1142 Type 2 diabetes mellitus with diabetic polyneuropathy: Secondary | ICD-10-CM | POA: Insufficient documentation

## 2015-08-16 DIAGNOSIS — E1122 Type 2 diabetes mellitus with diabetic chronic kidney disease: Secondary | ICD-10-CM | POA: Diagnosis not present

## 2015-08-16 DIAGNOSIS — N183 Chronic kidney disease, stage 3 unspecified: Secondary | ICD-10-CM | POA: Insufficient documentation

## 2015-08-16 DIAGNOSIS — I5043 Acute on chronic combined systolic (congestive) and diastolic (congestive) heart failure: Secondary | ICD-10-CM

## 2015-08-16 DIAGNOSIS — Z7982 Long term (current) use of aspirin: Secondary | ICD-10-CM | POA: Insufficient documentation

## 2015-08-16 DIAGNOSIS — I130 Hypertensive heart and chronic kidney disease with heart failure and stage 1 through stage 4 chronic kidney disease, or unspecified chronic kidney disease: Secondary | ICD-10-CM | POA: Diagnosis not present

## 2015-08-16 DIAGNOSIS — R972 Elevated prostate specific antigen [PSA]: Secondary | ICD-10-CM | POA: Insufficient documentation

## 2015-08-16 DIAGNOSIS — I2510 Atherosclerotic heart disease of native coronary artery without angina pectoris: Secondary | ICD-10-CM | POA: Insufficient documentation

## 2015-08-16 DIAGNOSIS — Z95810 Presence of automatic (implantable) cardiac defibrillator: Secondary | ICD-10-CM | POA: Insufficient documentation

## 2015-08-16 DIAGNOSIS — I5042 Chronic combined systolic (congestive) and diastolic (congestive) heart failure: Secondary | ICD-10-CM | POA: Diagnosis not present

## 2015-08-16 DIAGNOSIS — N320 Bladder-neck obstruction: Secondary | ICD-10-CM | POA: Insufficient documentation

## 2015-08-16 DIAGNOSIS — Z79899 Other long term (current) drug therapy: Secondary | ICD-10-CM | POA: Insufficient documentation

## 2015-08-16 DIAGNOSIS — Z8673 Personal history of transient ischemic attack (TIA), and cerebral infarction without residual deficits: Secondary | ICD-10-CM | POA: Insufficient documentation

## 2015-08-16 DIAGNOSIS — N139 Obstructive and reflux uropathy, unspecified: Secondary | ICD-10-CM

## 2015-08-16 DIAGNOSIS — Z825 Family history of asthma and other chronic lower respiratory diseases: Secondary | ICD-10-CM | POA: Diagnosis not present

## 2015-08-16 DIAGNOSIS — I252 Old myocardial infarction: Secondary | ICD-10-CM | POA: Insufficient documentation

## 2015-08-16 DIAGNOSIS — E1121 Type 2 diabetes mellitus with diabetic nephropathy: Secondary | ICD-10-CM

## 2015-08-16 DIAGNOSIS — G4730 Sleep apnea, unspecified: Secondary | ICD-10-CM | POA: Insufficient documentation

## 2015-08-16 LAB — BASIC METABOLIC PANEL
Anion gap: 13 (ref 5–15)
BUN: 63 mg/dL — AB (ref 6–20)
CO2: 22 mmol/L (ref 22–32)
CREATININE: 2.22 mg/dL — AB (ref 0.61–1.24)
Calcium: 8.7 mg/dL — ABNORMAL LOW (ref 8.9–10.3)
Chloride: 101 mmol/L (ref 101–111)
GFR calc Af Amer: 33 mL/min — ABNORMAL LOW (ref >60)
GFR, EST NON AFRICAN AMERICAN: 28 mL/min — AB (ref >60)
GLUCOSE: 262 mg/dL — AB (ref 65–99)
POTASSIUM: 4.2 mmol/L (ref 3.5–5.1)
SODIUM: 136 mmol/L (ref 135–145)

## 2015-08-16 LAB — DIGOXIN LEVEL: DIGOXIN LVL: 1.1 ng/mL (ref 0.8–2.0)

## 2015-08-16 NOTE — Patient Instructions (Signed)
Labs today  Your physician recommends that you schedule a follow-up appointment in: 3 weeks with Dr.Bensmihon    Do the following things EVERYDAY: 1) Weigh yourself in the morning before breakfast. Write it down and keep it in a log. 2) Take your medicines as prescribed 3) Eat low salt foods-Limit salt (sodium) to 2000 mg per day.  4) Stay as active as you can everyday 5) Limit all fluids for the day to less than 2 liters 6)

## 2015-08-16 NOTE — Progress Notes (Signed)
Advanced Heart Failure Medication Review by a Pharmacist  Does the patient  feel that his/her medications are working for him/her?  yes  Has the patient been experiencing any side effects to the medications prescribed?  no  Does the patient measure his/her own blood pressure or blood glucose at home?  yes   Does the patient have any problems obtaining medications due to transportation or finances?   no  Understanding of regimen: good Understanding of indications: good Potential of compliance: good Patient understands to avoid NSAIDs. Patient understands to avoid decongestants.   Pharmacist comments: Presents with daughter. Reports taking all morning doses prior to visit today. Noted pt had stopped taking KCl vs reducing dose to 20 mEq daily per note on 4/19. Denies missing doses or difficulty obtaining medications otherwise. No specific medication related questions at this time.    Time with patient: 72min Preparation and documentation time: 65min Total time: 34min  [name], PharmD Clinical Pharmacy Resident 11:55 AM, 08/16/2015
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
## 2015-08-17 ENCOUNTER — Other Ambulatory Visit: Payer: Self-pay

## 2015-08-17 ENCOUNTER — Other Ambulatory Visit (HOSPITAL_COMMUNITY): Payer: Self-pay | Admitting: *Deleted

## 2015-08-17 DIAGNOSIS — N183 Chronic kidney disease, stage 3 (moderate): Secondary | ICD-10-CM | POA: Diagnosis not present

## 2015-08-17 DIAGNOSIS — I5043 Acute on chronic combined systolic (congestive) and diastolic (congestive) heart failure: Secondary | ICD-10-CM | POA: Diagnosis not present

## 2015-08-17 DIAGNOSIS — I2510 Atherosclerotic heart disease of native coronary artery without angina pectoris: Secondary | ICD-10-CM | POA: Diagnosis not present

## 2015-08-17 DIAGNOSIS — I129 Hypertensive chronic kidney disease with stage 1 through stage 4 chronic kidney disease, or unspecified chronic kidney disease: Secondary | ICD-10-CM | POA: Diagnosis not present

## 2015-08-17 DIAGNOSIS — L97211 Non-pressure chronic ulcer of right calf limited to breakdown of skin: Secondary | ICD-10-CM | POA: Diagnosis not present

## 2015-08-17 DIAGNOSIS — E1122 Type 2 diabetes mellitus with diabetic chronic kidney disease: Secondary | ICD-10-CM | POA: Diagnosis not present

## 2015-08-17 DIAGNOSIS — I255 Ischemic cardiomyopathy: Secondary | ICD-10-CM | POA: Diagnosis not present

## 2015-08-17 DIAGNOSIS — I878 Other specified disorders of veins: Secondary | ICD-10-CM | POA: Diagnosis not present

## 2015-08-17 DIAGNOSIS — E1140 Type 2 diabetes mellitus with diabetic neuropathy, unspecified: Secondary | ICD-10-CM | POA: Diagnosis not present

## 2015-08-17 MED ORDER — DIGOXIN 125 MCG PO TABS: 0.0625 mg | ORAL_TABLET | Freq: Every day | ORAL | Status: DC

## 2015-08-17 NOTE — Patient Outreach (Signed)
[facility]) Care Management  08/17/2015  [name] [name] 05-04-1945 [id number]  RNCM called regarding transition of care and arranging home visit.   Plan: home visit scheduled.  [name], RN, MSN, [place] Coordinator Cell: [phone]

## 2015-08-18 ENCOUNTER — Ambulatory Visit: Payer: Self-pay

## 2015-08-18 ENCOUNTER — Encounter: Payer: Self-pay | Admitting: Family Medicine

## 2015-08-18 ENCOUNTER — Other Ambulatory Visit: Payer: Self-pay

## 2015-08-18 ENCOUNTER — Ambulatory Visit (INDEPENDENT_AMBULATORY_CARE_PROVIDER_SITE_OTHER): Payer: Commercial Managed Care - HMO | Admitting: Family Medicine

## 2015-08-18 VITALS — BP 110/80 | HR 86 | Temp 98.3°F | Wt 161.0 lb

## 2015-08-18 DIAGNOSIS — N189 Chronic kidney disease, unspecified: Secondary | ICD-10-CM

## 2015-08-18 DIAGNOSIS — E1342 Other specified diabetes mellitus with diabetic polyneuropathy: Secondary | ICD-10-CM | POA: Diagnosis not present

## 2015-08-18 DIAGNOSIS — I10 Essential (primary) hypertension: Secondary | ICD-10-CM

## 2015-08-18 DIAGNOSIS — E1140 Type 2 diabetes mellitus with diabetic neuropathy, unspecified: Secondary | ICD-10-CM

## 2015-08-18 DIAGNOSIS — Z794 Long term (current) use of insulin: Secondary | ICD-10-CM

## 2015-08-18 DIAGNOSIS — S81001D Unspecified open wound, right knee, subsequent encounter: Secondary | ICD-10-CM

## 2015-08-18 DIAGNOSIS — S81801D Unspecified open wound, right lower leg, subsequent encounter: Secondary | ICD-10-CM

## 2015-08-18 DIAGNOSIS — N183 Chronic kidney disease, stage 3 unspecified: Secondary | ICD-10-CM

## 2015-08-18 DIAGNOSIS — I5042 Chronic combined systolic (congestive) and diastolic (congestive) heart failure: Secondary | ICD-10-CM

## 2015-08-18 DIAGNOSIS — S91001D Unspecified open wound, right ankle, subsequent encounter: Secondary | ICD-10-CM

## 2015-08-18 DIAGNOSIS — E1165 Type 2 diabetes mellitus with hyperglycemia: Secondary | ICD-10-CM

## 2015-08-18 DIAGNOSIS — IMO0002 Reserved for concepts with insufficient information to code with codable children: Secondary | ICD-10-CM

## 2015-08-18 MED ORDER — MUPIROCIN 2 % EX OINT: 1.0000 "application " | TOPICAL_OINTMENT | Freq: Two times a day (BID) | CUTANEOUS | Status: AC

## 2015-08-18 NOTE — Patient Outreach (Signed)
[facility]) Care Management  [name]  08/18/2015   [name] [name] 02-17-1946 [id number]  Subjective: member reports feeling better since discharge.  Objective: BP 112/78 mmHg  Pulse 74  Resp 18  Ht 1.88 m (6\' 2" )  Wt 161 lb (73.029 kg)  BMI 20.66 kg/m2  SpO2 99%  Encounter Medications:  Outpatient Encounter Prescriptions as of 08/18/2015  Medication Sig Note  . aspirin EC 81 MG tablet Take 81 mg by mouth daily.   . digoxin (LANOXIN) 0.125 MG tablet Take 0.5 tablets (0.0625 mg total) by mouth daily.   . hydrALAZINE (APRESOLINE) 25 MG tablet Take 1 tablet (25 mg total) by mouth 3 (three) times daily. 08/18/2015: Has 10 mg tablets and is taking 1.5 tablets three times/day.  . insulin aspart (NOVOLOG) 100 UNIT/ML FlexPen 5-13 units 3 times daily (Patient taking differently: Inject 5-13 Units into the skin 3 (three) times daily with meals. 5-13 units 3 times daily)   . insulin glargine (LANTUS) 100 UNIT/ML injection Inject 0.18 mLs (18 Units total) into the skin at bedtime. 08/18/2015: Per patient, MD Increased to 20 units at bedtime today.  . Insulin Pen Needle (BD PEN NEEDLE NANO U/F) 32G X 4 MM MISC Test once daily dx e10.9   . isosorbide mononitrate (IMDUR) 30 MG 24 hr tablet Take 1 tablet (30 mg total) by mouth daily.   . ivabradine (CORLANOR) 5 MG TABS tablet Take 1 tablet (5 mg total) by mouth 2 (two) times daily with a meal.   . Magnesium 250 MG TABS Take one tablet by mouth every other day   . Multiple Vitamins-Minerals (MULTIVITAMIN WITH MINERALS) tablet Take 1 tablet by mouth daily.   . mupirocin ointment (BACTROBAN) 2 % Apply 1 application topically 2 (two) times daily. To apply on wound   . rosuvastatin (CRESTOR) 40 MG tablet TAKE 1 TABLET BY MOUTH DAILY   . spironolactone (ALDACTONE) 25 MG tablet Take 0.5 tablets (12.5 mg total) by mouth daily.   . tamsulosin (FLOMAX) 0.4 MG CAPS capsule Take 1 capsule (0.4 mg total) by mouth daily.   Marland Kitchen torsemide  (DEMADEX) 20 MG tablet Take 2 tablets (40 mg total) by mouth 2 (two) times daily.    No facility-administered encounter medications on file as of 08/18/2015.    Functional Status:  In your present state of health, do you have any difficulty performing the following activities: 08/08/2015 07/26/2015  Hearing? N N  Vision? N N  Difficulty concentrating or making decisions? N N  Walking or climbing stairs? N N  Dressing or bathing? N N  Doing errands, shopping? N N  Preparing Food and eating ? N -  Using the Toilet? N -  In the past six months, have you accidently leaked urine? N -  Do you have problems with loss of bowel control? N -  Managing your Medications? N -  Managing your Finances? N -  Housekeeping or managing your Housekeeping? N -    Fall/Depression Screening: PHQ 2/9 Scores 08/18/2015 08/08/2015 07/25/2015  PHQ - 2 Score 0 0 0    Assessment: 70 year old with recent admission 4/4-4/13 for heart failure. Followed by the advanced heart failure clinic. Member established care with a new primary care physician today. Dr. [name].  Medication reviewed-member manages his own medications. Re: hydralazine. Member has 10 mg tablets and is taking 1.5 tablets three times/day. Per chart member is to take 25mg  three times/day. Member was not aware of a dosage  change. RNCM called advanced heart failure clinic to request a follow up call to member to clarify dosage.  Dressing to right leg intact(chronic venous ulcer).  Member reports dressing was just changed by primary care and advanced home health nurse is managing and monitoring his leg wound.   Plan: care coordination with home health nurse, continue weekly engagement for transition of care.  [name], RN, MSN, [place] Coordinator Cell: [phone]

## 2015-08-18 NOTE — Progress Notes (Addendum)
Subjective:    Patient ID: [name] [name], male    DOB: 07-22-1945, 70 y.o.   MRN: [id number]  HPI   [name] [name] is a 70 y/o male, who is here today with his daughter to follow on recent hospitalization.   He has Hx of CAD s/p previous anterior MI in 2005, systolic HF EF 15%, HTN, CVA 2015 with mild right leg weakness, DM2 (last A1c 9.7 in 12/16) CKD stage III (baseline creatine 1.7) and chronic LE wounds.  Admitted on 07/26/15 and discharged 08/04/15. He was transferred from heart clinic to hospital because worsening dyspnea and edema, acute CHF exacerbation.   Discharge Dx:  1. Acute on chronic systolic CHF 2. CAD 3. CKD stage III 4. DM 5. RLE wound - full thickness stasis ulcer. 6. Hypokalemia - resolved   He followed with CHF clinic yesterday and according to pt, he had labs and his Digoxin was adjusted. Since his discharged he has not had falls and feels "pretty good" except for fatigue.  Dyspnea has improved, just mild difficulty when walking a hill, no problem with daily activities or walking flat ground.  He has PT 2 times per week and nurse visit one weekly,who checks BP, review meds, and does wound care.  Weighing at home, 161 +/-1 pounds for the past few days.   Hypertension: Currently he is on Hydralazine 25 mg tid, Imdur 30 mg daily,and Spironolactone 12.5 mg daily. Also on Digoxin , which he was instructed to decrease to 1/2 ,starting tomorrow. Torsemide 40 mg bid.  He is taking medications as instructed, no side effects reported.  He has not noted unusual headache, visual changes, exertional chest pain, worsening dyspnea,  focal weakness, or increase edema.  Lab Results  Component Value Date   CREATININE 2.22* 08/16/2015   BUN 63* 08/16/2015   NA 136 08/16/2015   K 4.2 08/16/2015   CL 101 08/16/2015   CO2 22 08/16/2015     CHF, CAD, and hx of CVA with residual RLE weakness (per records). Denies PND or orthopnea, sleeps on a pillow.  Echo 03/2015  LVEF 15%, mild MR, moderate TR. + ICD placement. He does not need assistance for walking. He does not drive. Hx of OSA, he does not wear CPAP.  Taking Crestor 40 mg daily and Aspirin 81 mg daily.   Lab Results  Component Value Date   CHOL 120 03/09/2014   HDL 39* 03/09/2014   LDLCALC 56 03/09/2014   LDLDIRECT 100.5 02/08/2012   TRIG 123 03/09/2014   CHOLHDL 3.1 03/09/2014   He follows with urologists, Hx of neurogenic bladder, cath bladder 1-2 times per day.   Diabetes Mellitus II:  With neuropathy and CKD III. He is currently on Lantus 18 U at night and Novolog sliding scale (usually 5-7 U) before meals.  Problem has been uncontrolled. He is checking BS's, denies any hypoglycemia. FG 170-200's, post prandial 170's-19's.  He is tolerating medications well. he denies abdominal pain, nausea, vomiting, polydipsia, polyuria, or polyphagia. Pos toes tingling, denies numbness or burning.  No gross hematuria or foam in urine.  He has lesions on left foot and right  leg wound, not longer following with wound clinic. Reporting lesions as otherwise stable. No fever, chills, or purulent drainage.  He has appt with endocrinologists next months.   Lab Results  Component Value Date   HGBA1C 9.1* 07/26/2015   Lab Results  Component Value Date   MICROALBUR 43.3* 02/08/2012    Review of Systems  Constitutional: Positive for fatigue. Negative for fever, appetite change and unexpected weight change.  HENT: Negative for nosebleeds, sore throat and trouble swallowing.   Eyes: Negative for visual disturbance.  Respiratory: Positive for shortness of breath (with moderate exertion:walking a hill.Improved). Negative for apnea, cough and wheezing.   Cardiovascular: Positive for leg swelling (improved). Negative for chest pain and palpitations.  Gastrointestinal: Negative for nausea, vomiting and abdominal pain.  Endocrine: Negative for polydipsia, polyphagia and polyuria.    Genitourinary: Positive for frequency (Nocturia x 2, chronic). Negative for dysuria, hematuria and decreased urine volume.       Hx of neurogenic bladder, cath himself 1-2 times daily.  Musculoskeletal: Negative for back pain and gait problem.  Skin: Positive for wound. Negative for color change and rash.  Neurological: Positive for dizziness (occasionally when rapid standing). Negative for seizures, syncope, speech difficulty, weakness, numbness and headaches.  Psychiatric/Behavioral: Negative for confusion. The patient is not nervous/anxious.      Current Outpatient Prescriptions on File Prior to Visit  Medication Sig Dispense Refill  . aspirin EC 81 MG tablet Take 81 mg by mouth daily.    . digoxin (LANOXIN) 0.125 MG tablet Take 0.5 tablets (0.0625 mg total) by mouth daily. 30 tablet 6  . hydrALAZINE (APRESOLINE) 25 MG tablet Take 1 tablet (25 mg total) by mouth 3 (three) times daily. 90 tablet 6  . insulin aspart (NOVOLOG) 100 UNIT/ML FlexPen 5-13 units 3 times daily (Patient taking differently: Inject 5-13 Units into the skin 3 (three) times daily with meals. 5-13 units 3 times daily) 15 pen 11  . insulin glargine (LANTUS) 100 UNIT/ML injection Inject 0.18 mLs (18 Units total) into the skin at bedtime. 10 mL 1  . Insulin Pen Needle (BD PEN NEEDLE NANO U/F) 32G X 4 MM MISC Test once daily dx e10.9 200 each 3  . isosorbide mononitrate (IMDUR) 30 MG 24 hr tablet Take 1 tablet (30 mg total) by mouth daily. 30 tablet 6  . ivabradine (CORLANOR) 5 MG TABS tablet Take 1 tablet (5 mg total) by mouth 2 (two) times daily with a meal. 60 tablet 6  . Magnesium 250 MG TABS Take one tablet by mouth every other day  0  . Multiple Vitamins-Minerals (MULTIVITAMIN WITH MINERALS) tablet Take 1 tablet by mouth daily.    . rosuvastatin (CRESTOR) 40 MG tablet TAKE 1 TABLET BY MOUTH DAILY 90 tablet 0  . spironolactone (ALDACTONE) 25 MG tablet Take 0.5 tablets (12.5 mg total) by mouth daily. 15 tablet 6  .  tamsulosin (FLOMAX) 0.4 MG CAPS capsule Take 1 capsule (0.4 mg total) by mouth daily. 30 capsule 6  . torsemide (DEMADEX) 20 MG tablet Take 2 tablets (40 mg total) by mouth 2 (two) times daily. 130 tablet 6   No current facility-administered medications on file prior to visit.     Past Medical History  Diagnosis Date  . Hypertension   . Chronic combined systolic and diastolic heart failure (HCC)   . Peripheral neuropathy (HCC)   . Gout   . Hx of echocardiogram 2015    Echo (08/2013):  EF 10-15%, diff HK, mod LAE, severe RVE, mod reduced RVSF, mild RAE, PASP 41 mmHg.  . Ischemic cardiomyopathy     Echo (8/11):  EF 40-45%;  Echo (5/15):  EF 10-15%  . CAD (coronary artery disease)     a. LHC (08/2005):  Ostial LAD 99%, mid LAD 50%, superior D1 80%, inferior D1 75%, mid OM proximal 80%, proximal PDA  25-30%, mid RCA 99%.   MRI with full thickness scar.  Med Rx.  2015 demonstrated similar diffuse three-vessel coronary disease. Perfusion imaging with rest we distribution demonstrated no viability in the area of the LAD and circumflex. Medical management.  Marland Kitchen NSVT (nonsustained ventricular tachycardia) (HCC)     s/p ICD  . CKD (chronic kidney disease)   . Automatic implantable cardioverter-defibrillator in situ   . Myocardial infarction [facility]) 2007    out of hosp MI - LHC with 3v CAD rx medically  . Sleep apnea     "has CPAP; won't use it"  . Stroke [facility]) ~ 2013    "right leg weak since" (12/09/2013)  . Type II diabetes mellitus (HCC) dx'd 2005  . CAD, multiple vessel, RCA 100% mid occl.; LAD 99% stenosisprox.  mid of 50-60%; LCX OM-1 90%, OM-2 99%,AV groove 80%  12/10/2013    Social History   Social History  . Marital Status: Married    Spouse Name: N/A  . Number of Children: N/A  . Years of Education: N/A   Social History Main Topics  . Smoking status: Never Smoker   . Smokeless tobacco: Never Used  . Alcohol Use: No  . Drug Use: No  . Sexual Activity: Not Asked   Other Topics  Concern  . None   Social History Narrative    Filed Vitals:   08/18/15 0815  BP: 110/80  Pulse: 86  Temp: 98.3 F (36.8 C)   Body mass index is 20.66 kg/(m^2).  SpO2 Readings from Last 3 Encounters:  08/16/15 99%  08/04/15 100%  07/25/15 95%       Objective:   Physical Exam  Constitutional: He is oriented to person, place, and time. He appears well-developed and well-nourished. No distress.  HENT:  Head: Normocephalic and atraumatic.  Eyes: Conjunctivae and EOM are normal. Pupils are equal, round, and reactive to light.  Neck: No JVD present. No thyromegaly present.  Cardiovascular: Normal rate and regular rhythm.   Murmur (SEM II/VI RUSB and apex < LUSB) heard.  Systolic murmur is present  Distal pulses present, DP and TP, bilateral, left +1.Normal capillary refill bilateral.  Pulmonary/Chest: Effort normal. He has no wheezes. He has no rales.  Abdominal: Soft. He exhibits no distension and no mass. There is no tenderness.  Musculoskeletal: He exhibits edema (1+ pitting edema LE bilateral.). He exhibits no tenderness.  Lymphadenopathy:    He has no cervical adenopathy.  Neurological: He is alert and oriented to person, place, and time. No cranial nerve deficit. Coordination and gait (stable with no assistance) normal.  Decreased monofilament bilateral.  Skin: Skin is warm. Lesion noted. No cyanosis.  RLE distal medial aspect with a 1 cm superficial wound with granulation tissue, mild surrounded erythema, no tender and no drainage.  Also left foot, plantar on 3rd and 4th tip toes black crust, no edema or erythema. On 5th metatarsus 2 cm  Callus.  Psychiatric: He has a normal mood and affect.       Assessment & Plan:    Kailor was seen today for hospitalization follow-up.  Diagnoses and all orders for this visit:  Uncontrolled type 2 diabetes mellitus with diabetic neuropathy, with long-term current use of insulin (HCC) Some adverse effects of poorly controlled  DM. Lantus increased from 18 U to 20 U, rest unchanged. Keep appt with endocrinologists. Foot care discussed. He is due for eye exam, recommended.  Polyneuropathy due to secondary diabetes mellitus (HCC) Foot care discussed. Foot lesions  are chronic, no signs of infection.   Chronic renal insufficiency, stage III (moderate) Avoid NSAID's. Better DM controlled. No changes in antihypertensive meds. Had labs yesterday.  Chronic combined systolic and diastolic heart failure (HCC) No changes in current management. Fluid and salt restrictions discussed. Continue daily wt. Follows with HF clinic, keep next appt (09/02/15).  Essential hypertension Adequately controlled. No changes in current management. DASH diet recommended. Eye exam recommended annually. F/U in 6 months, before if needed.  Open wound of knee, leg (except thigh), and ankle, complicated, right, subsequent encounter Chronic, venous ulcer. Continue wound care at home. Avoid worsening edema. LE elevation. Monitor for signs of infection.  -     mupirocin ointment (BACTROBAN) 2 %; Apply 1 application topically 2 (two) times daily. To apply on wound     -Patient advised to return or notify a doctor immediately if symptoms worsen or persist or new concerns arise.    Analiz Tvedt G. [place], MD  [place]. [place] office.

## 2015-08-18 NOTE — Progress Notes (Signed)
Pre visit review using our clinic review tool, if applicable. No additional management support is needed unless otherwise documented below in the visit note. 

## 2015-08-18 NOTE — Patient Instructions (Signed)
A few things to remember from today's visit:   1. Uncontrolled type 2 diabetes mellitus with diabetic neuropathy, with long-term current use of insulin ([place])   2. Polyneuropathy due to secondary diabetes mellitus ([place])   3. Chronic renal insufficiency, stage III (moderate)   4. Chronic combined systolic and diastolic heart failure ([place])   5. Essential hypertension   Keep appt with urologists and endocrinologists. No changes today.  Next visit in 2 months I will check labs.  HgA1C goal < 8.0, ideally < 7.5. Avoid sugar added food.  Fasting blood sugar ideally 140 or less, 2 hours after meals less than 180. Regular exercise also will help with controlling disease, daily brisk walking as tolerated for 15-30 min definitively will help. Avoid skipping meals, blood sugar might drop and cause serious problems. Remember checking feet periodically, good dental hygiene, and annual eye exam.        If you sign-up for My chart, you can communicate easier with [place] in case you have any question or concern.

## 2015-08-19 DIAGNOSIS — I129 Hypertensive chronic kidney disease with stage 1 through stage 4 chronic kidney disease, or unspecified chronic kidney disease: Secondary | ICD-10-CM | POA: Diagnosis not present

## 2015-08-19 DIAGNOSIS — N183 Chronic kidney disease, stage 3 (moderate): Secondary | ICD-10-CM | POA: Diagnosis not present

## 2015-08-19 DIAGNOSIS — L97211 Non-pressure chronic ulcer of right calf limited to breakdown of skin: Secondary | ICD-10-CM | POA: Diagnosis not present

## 2015-08-19 DIAGNOSIS — I2510 Atherosclerotic heart disease of native coronary artery without angina pectoris: Secondary | ICD-10-CM | POA: Diagnosis not present

## 2015-08-19 DIAGNOSIS — I5043 Acute on chronic combined systolic (congestive) and diastolic (congestive) heart failure: Secondary | ICD-10-CM | POA: Diagnosis not present

## 2015-08-19 DIAGNOSIS — I878 Other specified disorders of veins: Secondary | ICD-10-CM | POA: Diagnosis not present

## 2015-08-19 DIAGNOSIS — I255 Ischemic cardiomyopathy: Secondary | ICD-10-CM | POA: Diagnosis not present

## 2015-08-19 DIAGNOSIS — E1122 Type 2 diabetes mellitus with diabetic chronic kidney disease: Secondary | ICD-10-CM | POA: Diagnosis not present

## 2015-08-19 DIAGNOSIS — E1140 Type 2 diabetes mellitus with diabetic neuropathy, unspecified: Secondary | ICD-10-CM | POA: Diagnosis not present

## 2015-08-23 DIAGNOSIS — E1122 Type 2 diabetes mellitus with diabetic chronic kidney disease: Secondary | ICD-10-CM | POA: Diagnosis not present

## 2015-08-23 DIAGNOSIS — E1140 Type 2 diabetes mellitus with diabetic neuropathy, unspecified: Secondary | ICD-10-CM | POA: Diagnosis not present

## 2015-08-23 DIAGNOSIS — I129 Hypertensive chronic kidney disease with stage 1 through stage 4 chronic kidney disease, or unspecified chronic kidney disease: Secondary | ICD-10-CM | POA: Diagnosis not present

## 2015-08-23 DIAGNOSIS — I255 Ischemic cardiomyopathy: Secondary | ICD-10-CM | POA: Diagnosis not present

## 2015-08-23 DIAGNOSIS — N183 Chronic kidney disease, stage 3 (moderate): Secondary | ICD-10-CM | POA: Diagnosis not present

## 2015-08-23 DIAGNOSIS — L97211 Non-pressure chronic ulcer of right calf limited to breakdown of skin: Secondary | ICD-10-CM | POA: Diagnosis not present

## 2015-08-23 DIAGNOSIS — I2510 Atherosclerotic heart disease of native coronary artery without angina pectoris: Secondary | ICD-10-CM | POA: Diagnosis not present

## 2015-08-23 DIAGNOSIS — I878 Other specified disorders of veins: Secondary | ICD-10-CM | POA: Diagnosis not present

## 2015-08-23 DIAGNOSIS — I5043 Acute on chronic combined systolic (congestive) and diastolic (congestive) heart failure: Secondary | ICD-10-CM | POA: Diagnosis not present

## 2015-08-24 ENCOUNTER — Telehealth (HOSPITAL_COMMUNITY): Payer: Self-pay | Admitting: *Deleted

## 2015-08-24 ENCOUNTER — Other Ambulatory Visit: Payer: Self-pay

## 2015-08-24 NOTE — Telephone Encounter (Signed)
Denton Brick, RN w/[facility] called to let us know when she saw pt and went over meds he is only taking Hydralazine 15 mg TID, he has 10 mg tabs and is taking 1.5 tabs TID.  Per chart pt is suppose to be on 25 mg TID.  She states BP runs around 110s.  Will send to MD to review and f/u with pt.

## 2015-08-24 NOTE — Patient Outreach (Signed)
[facility] Care Management  08/24/2015  MOIZ BIRMAN [birth year] [id number]  Subjective: "I am in the yellow zone. I just don't feel good".  Assessment: 70 year old admitted 4/4-4/13 with heart failure. RNCM called for transition of care. Member reports he continues to weigh self daily and states his weight has been between 160-162 pounds. Member reports he noticed decrease in energy level today. Denies any other signs/symptoms. No increase in weight. RNCM encouraged member to continue to monitor and call provider if symptoms continue or if other signs/symptoms of heart failure exacerbation present. Member reports he has not received a follow up call regarding hydralazine and he continues to take the same dosage of hydralazine. He is without any other questions or concerns.  Member acknowledges that he has a follow up appointment at the heart failure clinic next week.  Plan: Advanced Heart Failure clinic called regarding clarification of hydralazine dose-spoke with Heather. RNCM will continue weekly transition of care engagements.  [name], RN, MSN, [place] Coordinator Cell: [phone]

## 2015-08-25 DIAGNOSIS — I2510 Atherosclerotic heart disease of native coronary artery without angina pectoris: Secondary | ICD-10-CM | POA: Diagnosis not present

## 2015-08-25 DIAGNOSIS — L97211 Non-pressure chronic ulcer of right calf limited to breakdown of skin: Secondary | ICD-10-CM | POA: Diagnosis not present

## 2015-08-25 DIAGNOSIS — E1140 Type 2 diabetes mellitus with diabetic neuropathy, unspecified: Secondary | ICD-10-CM | POA: Diagnosis not present

## 2015-08-25 DIAGNOSIS — N183 Chronic kidney disease, stage 3 (moderate): Secondary | ICD-10-CM | POA: Diagnosis not present

## 2015-08-25 DIAGNOSIS — I5043 Acute on chronic combined systolic (congestive) and diastolic (congestive) heart failure: Secondary | ICD-10-CM | POA: Diagnosis not present

## 2015-08-25 DIAGNOSIS — E1122 Type 2 diabetes mellitus with diabetic chronic kidney disease: Secondary | ICD-10-CM | POA: Diagnosis not present

## 2015-08-25 DIAGNOSIS — I878 Other specified disorders of veins: Secondary | ICD-10-CM | POA: Diagnosis not present

## 2015-08-25 DIAGNOSIS — I129 Hypertensive chronic kidney disease with stage 1 through stage 4 chronic kidney disease, or unspecified chronic kidney disease: Secondary | ICD-10-CM | POA: Diagnosis not present

## 2015-08-25 DIAGNOSIS — I255 Ischemic cardiomyopathy: Secondary | ICD-10-CM | POA: Diagnosis not present

## 2015-08-25 NOTE — Telephone Encounter (Signed)
Continue what he is doing for now. He has follow with with Dr Haroldine Laws next week. Will need to make sure he brings all medications to his appointment to review individual medication bottles.

## 2015-08-25 NOTE — Telephone Encounter (Signed)
Attempted to call pt, no answer will try again later

## 2015-08-26 DIAGNOSIS — E1122 Type 2 diabetes mellitus with diabetic chronic kidney disease: Secondary | ICD-10-CM | POA: Diagnosis not present

## 2015-08-26 DIAGNOSIS — I2510 Atherosclerotic heart disease of native coronary artery without angina pectoris: Secondary | ICD-10-CM | POA: Diagnosis not present

## 2015-08-26 DIAGNOSIS — E1140 Type 2 diabetes mellitus with diabetic neuropathy, unspecified: Secondary | ICD-10-CM | POA: Diagnosis not present

## 2015-08-26 DIAGNOSIS — N183 Chronic kidney disease, stage 3 (moderate): Secondary | ICD-10-CM | POA: Diagnosis not present

## 2015-08-26 DIAGNOSIS — I129 Hypertensive chronic kidney disease with stage 1 through stage 4 chronic kidney disease, or unspecified chronic kidney disease: Secondary | ICD-10-CM | POA: Diagnosis not present

## 2015-08-26 DIAGNOSIS — L97211 Non-pressure chronic ulcer of right calf limited to breakdown of skin: Secondary | ICD-10-CM | POA: Diagnosis not present

## 2015-08-26 DIAGNOSIS — I878 Other specified disorders of veins: Secondary | ICD-10-CM | POA: Diagnosis not present

## 2015-08-26 DIAGNOSIS — I5043 Acute on chronic combined systolic (congestive) and diastolic (congestive) heart failure: Secondary | ICD-10-CM | POA: Diagnosis not present

## 2015-08-26 DIAGNOSIS — I255 Ischemic cardiomyopathy: Secondary | ICD-10-CM | POA: Diagnosis not present

## 2015-08-30 DIAGNOSIS — N183 Chronic kidney disease, stage 3 (moderate): Secondary | ICD-10-CM | POA: Diagnosis not present

## 2015-08-30 DIAGNOSIS — L97211 Non-pressure chronic ulcer of right calf limited to breakdown of skin: Secondary | ICD-10-CM | POA: Diagnosis not present

## 2015-08-30 DIAGNOSIS — E1122 Type 2 diabetes mellitus with diabetic chronic kidney disease: Secondary | ICD-10-CM | POA: Diagnosis not present

## 2015-08-30 DIAGNOSIS — I255 Ischemic cardiomyopathy: Secondary | ICD-10-CM | POA: Diagnosis not present

## 2015-08-30 DIAGNOSIS — I5043 Acute on chronic combined systolic (congestive) and diastolic (congestive) heart failure: Secondary | ICD-10-CM | POA: Diagnosis not present

## 2015-08-30 DIAGNOSIS — I2510 Atherosclerotic heart disease of native coronary artery without angina pectoris: Secondary | ICD-10-CM | POA: Diagnosis not present

## 2015-08-30 DIAGNOSIS — I878 Other specified disorders of veins: Secondary | ICD-10-CM | POA: Diagnosis not present

## 2015-08-30 DIAGNOSIS — I129 Hypertensive chronic kidney disease with stage 1 through stage 4 chronic kidney disease, or unspecified chronic kidney disease: Secondary | ICD-10-CM | POA: Diagnosis not present

## 2015-08-30 DIAGNOSIS — E1140 Type 2 diabetes mellitus with diabetic neuropathy, unspecified: Secondary | ICD-10-CM | POA: Diagnosis not present

## 2015-08-30 NOTE — Telephone Encounter (Signed)
LATE ENTRY FROM 5/4:  Spoke w/pt, he is aware to continue current meds for now and will bring all pill bottles to next appt 5/12

## 2015-09-02 ENCOUNTER — Ambulatory Visit (HOSPITAL_COMMUNITY)
Admission: RE | Admit: 2015-09-02 | Discharge: 2015-09-02 | Disposition: A | Discharge status: Home / Self Care | Payer: Commercial Managed Care - HMO | Source: Ambulatory Visit | Attending: Internal Medicine | Admitting: Internal Medicine

## 2015-09-02 ENCOUNTER — Other Ambulatory Visit: Payer: Self-pay

## 2015-09-02 VITALS — BP 92/56 | HR 70 | Wt 155.0 lb

## 2015-09-02 DIAGNOSIS — Z8673 Personal history of transient ischemic attack (TIA), and cerebral infarction without residual deficits: Secondary | ICD-10-CM | POA: Insufficient documentation

## 2015-09-02 DIAGNOSIS — Z794 Long term (current) use of insulin: Secondary | ICD-10-CM | POA: Insufficient documentation

## 2015-09-02 DIAGNOSIS — I5042 Chronic combined systolic (congestive) and diastolic (congestive) heart failure: Secondary | ICD-10-CM | POA: Insufficient documentation

## 2015-09-02 DIAGNOSIS — E1142 Type 2 diabetes mellitus with diabetic polyneuropathy: Secondary | ICD-10-CM | POA: Diagnosis not present

## 2015-09-02 DIAGNOSIS — I2510 Atherosclerotic heart disease of native coronary artery without angina pectoris: Secondary | ICD-10-CM | POA: Diagnosis not present

## 2015-09-02 DIAGNOSIS — Z95810 Presence of automatic (implantable) cardiac defibrillator: Secondary | ICD-10-CM | POA: Diagnosis not present

## 2015-09-02 DIAGNOSIS — Z7982 Long term (current) use of aspirin: Secondary | ICD-10-CM | POA: Insufficient documentation

## 2015-09-02 DIAGNOSIS — R972 Elevated prostate specific antigen [PSA]: Secondary | ICD-10-CM | POA: Diagnosis not present

## 2015-09-02 DIAGNOSIS — M109 Gout, unspecified: Secondary | ICD-10-CM | POA: Diagnosis not present

## 2015-09-02 DIAGNOSIS — I252 Old myocardial infarction: Secondary | ICD-10-CM | POA: Insufficient documentation

## 2015-09-02 DIAGNOSIS — I5043 Acute on chronic combined systolic (congestive) and diastolic (congestive) heart failure: Secondary | ICD-10-CM

## 2015-09-02 DIAGNOSIS — Z79899 Other long term (current) drug therapy: Secondary | ICD-10-CM | POA: Insufficient documentation

## 2015-09-02 DIAGNOSIS — N183 Chronic kidney disease, stage 3 (moderate): Secondary | ICD-10-CM | POA: Diagnosis not present

## 2015-09-02 DIAGNOSIS — G4730 Sleep apnea, unspecified: Secondary | ICD-10-CM | POA: Diagnosis not present

## 2015-09-02 DIAGNOSIS — E1122 Type 2 diabetes mellitus with diabetic chronic kidney disease: Secondary | ICD-10-CM | POA: Diagnosis not present

## 2015-09-02 DIAGNOSIS — I255 Ischemic cardiomyopathy: Secondary | ICD-10-CM | POA: Diagnosis not present

## 2015-09-02 DIAGNOSIS — N320 Bladder-neck obstruction: Secondary | ICD-10-CM | POA: Diagnosis not present

## 2015-09-02 DIAGNOSIS — Z825 Family history of asthma and other chronic lower respiratory diseases: Secondary | ICD-10-CM | POA: Diagnosis not present

## 2015-09-02 DIAGNOSIS — I130 Hypertensive heart and chronic kidney disease with heart failure and stage 1 through stage 4 chronic kidney disease, or unspecified chronic kidney disease: Secondary | ICD-10-CM | POA: Diagnosis not present

## 2015-09-02 LAB — BASIC METABOLIC PANEL
ANION GAP: 12 (ref 5–15)
BUN: 50 mg/dL — ABNORMAL HIGH (ref 6–20)
CALCIUM: 9 mg/dL (ref 8.9–10.3)
CHLORIDE: 97 mmol/L — AB (ref 101–111)
CO2: 25 mmol/L (ref 22–32)
CREATININE: 2.04 mg/dL — AB (ref 0.61–1.24)
GFR calc Af Amer: 37 mL/min — ABNORMAL LOW (ref >60)
GFR calc non Af Amer: 32 mL/min — ABNORMAL LOW (ref >60)
GLUCOSE: 394 mg/dL — AB (ref 65–99)
Potassium: 4.1 mmol/L (ref 3.5–5.1)
Sodium: 134 mmol/L — ABNORMAL LOW (ref 135–145)

## 2015-09-02 NOTE — Patient Instructions (Signed)
Labs today  Your physician recommends that you schedule a follow-up appointment in: 6-8 weeks with Dr.Bensimhon  Your physician has recommended that you have a cardiopulmonary stress test (CPX). CPX testing is a non-invasive measurement of heart and lung function. It replaces a traditional treadmill stress test. This type of test provides a tremendous amount of information that relates not only to your present condition but also for future outcomes. This test combines measurements of you ventilation, respiratory gas exchange in the lungs, electrocardiogram (EKG), blood pressure and physical response before, during, and following an exercise protocol.  Do the following things EVERYDAY: 1) Weigh yourself in the morning before breakfast. Write it down and keep it in a log. 2) Take your medicines as prescribed 3) Eat low salt foods-Limit salt (sodium) to 2000 mg per day.  4) Stay as active as you can everyday 5) Limit all fluids for the day to less than 2 liters 6)

## 2015-09-02 NOTE — Progress Notes (Signed)
Advanced Heart Failure Medication Review by a Pharmacist  Does the patient  feel that his/her medications are working for him/her?  yes  Has the patient been experiencing any side effects to the medications prescribed?  no  Does the patient measure his/her own blood pressure or blood glucose at home?  no   Does the patient have any problems obtaining medications due to transportation or finances?   no  Understanding of regimen: fair Understanding of indications: fair Potential of compliance: fair Patient understands to avoid NSAIDs. Patient understands to avoid decongestants.  Issues to address at subsequent visits: Compliance   Pharmacist comments:  [name] [name] is a pleasant 70 yo M presenting with his daughter and a medication list from his last HF clinic visit. His daughter who works for [employer] fills and manages his medications. He reports good compliance with his regimen and the only discrepancy I noted was in his hydralazine dosing which was updated accordingly. No other medication-related questions or concerns at this time.   Ruta Hinds. Velva Harman, PharmD, BCPS, CPP Clinical Pharmacist Pager: [phone] Phone: [phone] 09/02/2015 2:57 PM      Time with patient: 10 minutes Preparation and documentation time: 2 minutes Total time: 12 minutes
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
## 2015-09-02 NOTE — Patient Outreach (Signed)
[facility]) Care Management  09/02/2015  [name] [name] September 28, 1945 [id number]  Transition of care follow up. Member reports he just got back from cardiology heart failure appointment and confirmed he is to continue taking hydralazine 10 mg tablets as before. No medication changes. Member continues to weigh self daily. Member is without questions or concerns. Transition of care program complete.  Plan: continue to follow in heart failure program.  [name], RN, MSN, [place] Coordinator Cell: [phone]

## 2015-09-06 ENCOUNTER — Ambulatory Visit: Payer: Commercial Managed Care - HMO | Admitting: Internal Medicine

## 2015-09-07 DIAGNOSIS — L97211 Non-pressure chronic ulcer of right calf limited to breakdown of skin: Secondary | ICD-10-CM | POA: Diagnosis not present

## 2015-09-07 DIAGNOSIS — E1122 Type 2 diabetes mellitus with diabetic chronic kidney disease: Secondary | ICD-10-CM | POA: Diagnosis not present

## 2015-09-07 DIAGNOSIS — I2510 Atherosclerotic heart disease of native coronary artery without angina pectoris: Secondary | ICD-10-CM | POA: Diagnosis not present

## 2015-09-07 DIAGNOSIS — I129 Hypertensive chronic kidney disease with stage 1 through stage 4 chronic kidney disease, or unspecified chronic kidney disease: Secondary | ICD-10-CM | POA: Diagnosis not present

## 2015-09-07 DIAGNOSIS — I5043 Acute on chronic combined systolic (congestive) and diastolic (congestive) heart failure: Secondary | ICD-10-CM | POA: Diagnosis not present

## 2015-09-07 DIAGNOSIS — E1140 Type 2 diabetes mellitus with diabetic neuropathy, unspecified: Secondary | ICD-10-CM | POA: Diagnosis not present

## 2015-09-07 DIAGNOSIS — I878 Other specified disorders of veins: Secondary | ICD-10-CM | POA: Diagnosis not present

## 2015-09-07 DIAGNOSIS — I255 Ischemic cardiomyopathy: Secondary | ICD-10-CM | POA: Diagnosis not present

## 2015-09-07 DIAGNOSIS — N183 Chronic kidney disease, stage 3 (moderate): Secondary | ICD-10-CM | POA: Diagnosis not present

## 2015-09-08 ENCOUNTER — Ambulatory Visit (HOSPITAL_COMMUNITY): Payer: Commercial Managed Care - HMO | Attending: Internal Medicine

## 2015-09-08 DIAGNOSIS — I5043 Acute on chronic combined systolic (congestive) and diastolic (congestive) heart failure: Secondary | ICD-10-CM | POA: Diagnosis not present

## 2015-09-09 DIAGNOSIS — I5043 Acute on chronic combined systolic (congestive) and diastolic (congestive) heart failure: Secondary | ICD-10-CM | POA: Diagnosis not present

## 2015-09-09 DIAGNOSIS — I5042 Chronic combined systolic (congestive) and diastolic (congestive) heart failure: Secondary | ICD-10-CM | POA: Diagnosis not present

## 2015-09-12 ENCOUNTER — Other Ambulatory Visit (HOSPITAL_COMMUNITY): Payer: Self-pay | Admitting: Internal Medicine

## 2015-09-20 ENCOUNTER — Other Ambulatory Visit: Payer: Self-pay

## 2015-09-20 VITALS — BP 104/70 | HR 87 | Resp 18 | Ht 74.0 in | Wt 152.0 lb

## 2015-09-20 DIAGNOSIS — N401 Enlarged prostate with lower urinary tract symptoms: Secondary | ICD-10-CM | POA: Diagnosis not present

## 2015-09-20 DIAGNOSIS — E1165 Type 2 diabetes mellitus with hyperglycemia: Principal | ICD-10-CM

## 2015-09-20 DIAGNOSIS — R338 Other retention of urine: Secondary | ICD-10-CM | POA: Diagnosis not present

## 2015-09-20 DIAGNOSIS — N1330 Unspecified hydronephrosis: Secondary | ICD-10-CM | POA: Diagnosis not present

## 2015-09-20 DIAGNOSIS — E1122 Type 2 diabetes mellitus with diabetic chronic kidney disease: Secondary | ICD-10-CM

## 2015-09-20 NOTE — Patient Outreach (Signed)
[facility]) Care Management  [place]  09/20/2015   [name] [name] 06/23/1945 [id number]  Subjective: member reports he is doing well, denies any signs/symptoms of heart failure exacerbation.  Objective: BP 104/70 mmHg  Pulse 87  Resp 18  Ht 1.88 m (6\' 2" )  Wt 152 lb (68.947 kg)  BMI 19.51 kg/m2  SpO2 100% Lungs clear  Encounter Medications:  Outpatient Encounter Prescriptions as of 09/20/2015  Medication Sig Note  . aspirin EC 81 MG tablet Take 81 mg by mouth daily.   . digoxin (LANOXIN) 0.125 MG tablet Take 0.5 tablets (0.0625 mg total) by mouth daily. 09/20/2015: Correction-Has .125mg  tablets, states he is breaking the tablet in-half for .625mg  daily.   . hydrALAZINE (APRESOLINE) 10 MG tablet Take 15 mg by mouth 3 (three) times daily.   . insulin aspart (NOVOLOG FLEXPEN) 100 UNIT/ML FlexPen Inject 5-15 Units into the skin 3 (three) times daily with meals.   . insulin glargine (LANTUS) 100 UNIT/ML injection Inject 20 Units into the skin at bedtime.   . Insulin Pen Needle (BD PEN NEEDLE NANO U/F) 32G X 4 MM MISC Test once daily dx e10.9   . isosorbide mononitrate (IMDUR) 30 MG 24 hr tablet Take 1 tablet (30 mg total) by mouth daily.   . ivabradine (CORLANOR) 5 MG TABS tablet Take 1 tablet (5 mg total) by mouth 2 (two) times daily with a meal.   . Magnesium 250 MG TABS Take one tablet by mouth every other day   . Multiple Vitamins-Minerals (MULTIVITAMIN WITH MINERALS) tablet Take 1 tablet by mouth daily.   . rosuvastatin (CRESTOR) 40 MG tablet TAKE 1 TABLET BY MOUTH DAILY   . spironolactone (ALDACTONE) 25 MG tablet Take 0.5 tablets (12.5 mg total) by mouth daily.   . tamsulosin (FLOMAX) 0.4 MG CAPS capsule Take 1 capsule (0.4 mg total) by mouth daily.   Marland Kitchen torsemide (DEMADEX) 20 MG tablet Take 2 tablets (40 mg total) by mouth 2 (two) times daily.    No facility-administered encounter medications on file as of 09/20/2015.    Functional Status:  In your  present state of health, do you have any difficulty performing the following activities: 08/08/2015 07/26/2015  Hearing? N N  Vision? N N  Difficulty concentrating or making decisions? N N  Walking or climbing stairs? N N  Dressing or bathing? N N  Doing errands, shopping? N N  Preparing Food and eating ? N -  Using the Toilet? N -  In the past six months, have you accidently leaked urine? N -  Do you have problems with loss of bowel control? N -  Managing your Medications? N -  Managing your Finances? N -  Housekeeping or managing your Housekeeping? N -    Fall/Depression Screening: PHQ 2/9 Scores 08/18/2015 08/08/2015 07/25/2015  PHQ - 2 Score 0 0 0    Assessment: 70 year old admitted for heart failure 4/4-4/13. Member denies any issues or concerns regarding heart failure. Member has been attending all follow up appointments. He is weighing daily and states his weight today is 152 pounds. Member reports he is working on increased strength/stamina by walking. fall prevention discussed. Member reports home health remains involved.  Wound area to right inner lower leg healed to approximately 1/2cm scabbed area. No other areas noted.  History of diabetes-RNCM discussed diabetes management. Member could benefit from disease management for diabetes. Member agrees to transition to Engineer, maintenance for diabetes management.  Plan: Refer to  health coach diabetes education  [name], RN, MSN, [place] Coordinator Cell: [phone]

## 2015-09-22 DIAGNOSIS — E1140 Type 2 diabetes mellitus with diabetic neuropathy, unspecified: Secondary | ICD-10-CM | POA: Diagnosis not present

## 2015-09-22 DIAGNOSIS — L97211 Non-pressure chronic ulcer of right calf limited to breakdown of skin: Secondary | ICD-10-CM | POA: Diagnosis not present

## 2015-09-22 DIAGNOSIS — I878 Other specified disorders of veins: Secondary | ICD-10-CM | POA: Diagnosis not present

## 2015-09-22 DIAGNOSIS — I2510 Atherosclerotic heart disease of native coronary artery without angina pectoris: Secondary | ICD-10-CM | POA: Diagnosis not present

## 2015-09-22 DIAGNOSIS — N183 Chronic kidney disease, stage 3 (moderate): Secondary | ICD-10-CM | POA: Diagnosis not present

## 2015-09-22 DIAGNOSIS — I255 Ischemic cardiomyopathy: Secondary | ICD-10-CM | POA: Diagnosis not present

## 2015-09-22 DIAGNOSIS — I129 Hypertensive chronic kidney disease with stage 1 through stage 4 chronic kidney disease, or unspecified chronic kidney disease: Secondary | ICD-10-CM | POA: Diagnosis not present

## 2015-09-22 DIAGNOSIS — E1122 Type 2 diabetes mellitus with diabetic chronic kidney disease: Secondary | ICD-10-CM | POA: Diagnosis not present

## 2015-09-22 DIAGNOSIS — I5043 Acute on chronic combined systolic (congestive) and diastolic (congestive) heart failure: Secondary | ICD-10-CM | POA: Diagnosis not present

## 2015-09-26 ENCOUNTER — Other Ambulatory Visit: Payer: Self-pay

## 2015-09-26 NOTE — Patient Outreach (Signed)
[facility]) Care Management  09/26/2015  [name] [name] 1945-06-16 [id number]   Telephone call to patient for health coach introductory call.  Explained to patient health coach and role.  Patient receptive to call.    Plan: RN Health Coach will contact patient within one month and patient agrees to next outreach.    Jone Baseman, RN, MSN [place] [phone]

## 2015-10-05 ENCOUNTER — Telehealth (HOSPITAL_COMMUNITY): Payer: Self-pay | Admitting: *Deleted

## 2015-10-05 NOTE — Telephone Encounter (Signed)
[facility] pharmacy called to verify pt's Hydralazine dose.  They state when pt was d/c from hospital 4/13 a new rx for hydralazine 25 mg TID was sent in, however it was never filled b/c pt had plenty of his 10 mg tablets.  She states pt is now trying to fill rx and states it is wrong.  Pt has been taking Hydralazine 10 mg tablets, taking 1 & 1/2 tabs (15 mg) TID   She needs to know whether to fill the 25 mg tab rx or what.  Advised pt needs to continue what he has been taking until next appt 6/29.  She will fill 10 mg tablets for pt.  Will make a note to address Hydralazine dose at appt

## 2015-10-10 ENCOUNTER — Ambulatory Visit (INDEPENDENT_AMBULATORY_CARE_PROVIDER_SITE_OTHER): Payer: Commercial Managed Care - HMO | Admitting: *Deleted

## 2015-10-10 DIAGNOSIS — I255 Ischemic cardiomyopathy: Secondary | ICD-10-CM | POA: Diagnosis not present

## 2015-10-10 NOTE — Progress Notes (Signed)
Remote ICD transmission.   

## 2015-10-11 LAB — CUP PACEART REMOTE DEVICE CHECK
Battery Remaining Longevity: 74 mo
Battery Remaining Percentage: 74 %
Date Time Interrogation Session: 20170619063018
HIGH POWER IMPEDANCE MEASURED VALUE: 41 Ohm
HighPow Impedance: 41 Ohm
Implantable Lead Model: 7001
Lead Channel Impedance Value: 510 Ohm
Lead Channel Setting Pacing Amplitude: 2.5 V
Lead Channel Setting Sensing Sensitivity: 0.5 mV
MDC IDC LEAD IMPLANT DT: 20070518
MDC IDC LEAD LOCATION: 753860
MDC IDC MSMT BATTERY VOLTAGE: 2.96 V
MDC IDC MSMT LEADCHNL RV PACING THRESHOLD AMPLITUDE: 1 V
MDC IDC MSMT LEADCHNL RV PACING THRESHOLD PULSEWIDTH: 0.5 ms
MDC IDC MSMT LEADCHNL RV SENSING INTR AMPL: 12 mV
MDC IDC PG SERIAL: 1109991
MDC IDC SET LEADCHNL RV PACING PULSEWIDTH: 0.5 ms
MDC IDC STAT BRADY RV PERCENT PACED: 1 %

## 2015-10-13 ENCOUNTER — Ambulatory Visit: Payer: Commercial Managed Care - HMO | Admitting: Adult Health

## 2015-10-14 ENCOUNTER — Ambulatory Visit: Payer: Commercial Managed Care - HMO

## 2015-10-14 ENCOUNTER — Encounter: Payer: Self-pay | Admitting: Cardiology

## 2015-10-14 ENCOUNTER — Ambulatory Visit (INDEPENDENT_AMBULATORY_CARE_PROVIDER_SITE_OTHER): Payer: Commercial Managed Care - HMO | Admitting: Family Medicine

## 2015-10-14 ENCOUNTER — Encounter: Payer: Self-pay | Admitting: Family Medicine

## 2015-10-14 VITALS — BP 100/62 | HR 100 | Temp 98.0°F | Ht 74.0 in | Wt 141.6 lb

## 2015-10-14 DIAGNOSIS — R42 Dizziness and giddiness: Secondary | ICD-10-CM | POA: Diagnosis not present

## 2015-10-14 DIAGNOSIS — R634 Abnormal weight loss: Secondary | ICD-10-CM

## 2015-10-14 DIAGNOSIS — R1310 Dysphagia, unspecified: Secondary | ICD-10-CM | POA: Diagnosis not present

## 2015-10-14 DIAGNOSIS — I10 Essential (primary) hypertension: Secondary | ICD-10-CM

## 2015-10-14 MED ORDER — MIRTAZAPINE 15 MG PO TABS: 15.0000 mg | ORAL_TABLET | Freq: Every day | ORAL | Status: DC

## 2015-10-14 NOTE — Progress Notes (Signed)
HPI:   [name] is a 70 y.o. male, who is here today with his wife to follow on hospitalization in 07/2015. He already has followed a few times with cardiologists, CHF clinic and has an appt next week. His wife has a few concerns: Difficulty swallowing, no appetite, wt loss.   In general he feels better, still feeling fatigue since hospital discharged, stable.  +Exertional dyspnea, intermittently, stable.  -He states that he has had difficulty swallowing since CVA years ago. Hx of GERD and was on PPI, which helped some, it was discontinued after discharge in 07/2015. He did not feel like it was helping but his wife does. He states that he has problems mainly with bread, it feels very dry and given his fluid restrictions he can not drink fluids with every piece of food.  Decreased appetite since hospital discharge, has had some wt loss. He denies depression but his wife states that he may have some depressed mood. He states that he feels frustrated because all the physical limitations and dietary restrictions, he states that he knows he is not going to get better.  Denies abdominal pain, nausea, vomiting, changes in bowel habits, blood in stool or melena.  DM II, BS's 160-170, no 200's since his Lantus dose was increased. He denies any hypoglycemia. He was referred to endocrinologists, medications were not changed.   + Fall, attributed to knee instability, last one 2 weeks ago.  +Occasional dizziness when getting up and unstable gait. Hx of knee OA. PT was done after hodspital discharge. He has a cane but he does not use it daily. He also has a walker at home.   His BP noted on lower normal range.  Hx of hypertension: Currently he is on Hydralazine 15 mg tid, Imdur 30 mg daily, nd Spironolactone. He is also on Torsemide 40 mg bid.  Hetaking medications as instructed, following fluid restrictions. Hx of CKD.  He has not noted unusual headache, visual changes,  exertional chest pain,  focal weakness, or edema.  No gross hematuria or foam in urine.  Lab Results  Component Value Date   CREATININE 2.04* 09/02/2015   BUN 50* 09/02/2015   NA 134* 09/02/2015   K 4.1 09/02/2015   CL 97* 09/02/2015   CO2 25 09/02/2015    He follows with nephrologist, last seen 2 weeks ago and labs done, 3 months f/u.   Review of Systems  Constitutional: Positive for appetite change and fatigue. Negative for fever and unexpected weight change.  HENT: Negative for mouth sores, nosebleeds, sore throat and trouble swallowing.   Eyes: Negative for redness and visual disturbance.  Respiratory: Positive for shortness of breath. Negative for apnea, cough and wheezing.   Cardiovascular: Negative for chest pain and leg swelling.  Gastrointestinal: Negative for nausea, vomiting and abdominal pain.       No changes in bowel habits.  Genitourinary: Negative for dysuria, hematuria and decreased urine volume.  Musculoskeletal: Positive for arthralgias and gait problem. Negative for myalgias.  Skin: Negative for color change and rash.  Neurological: Positive for light-headedness. Negative for seizures, syncope, facial asymmetry, weakness and headaches.  Psychiatric/Behavioral: Negative for confusion and sleep disturbance. The patient is not nervous/anxious.       Current Outpatient Prescriptions on File Prior to Visit  Medication Sig Dispense Refill  . aspirin EC 81 MG tablet Take 81 mg by mouth daily.    . digoxin (LANOXIN) 0.125 MG tablet Take 0.5 tablets (0.0625  mg total) by mouth daily. 30 tablet 6  . hydrALAZINE (APRESOLINE) 10 MG tablet Take 10 mg by mouth 3 (three) times daily. Decreased.    . insulin aspart (NOVOLOG FLEXPEN) 100 UNIT/ML FlexPen Inject 5-15 Units into the skin 3 (three) times daily with meals.    . insulin glargine (LANTUS) 100 UNIT/ML injection Inject 20 Units into the skin at bedtime.    . Insulin Pen Needle (BD PEN NEEDLE NANO U/F) 32G X 4 MM  MISC Test once daily dx e10.9 200 each 3  . isosorbide mononitrate (IMDUR) 30 MG 24 hr tablet Take 1 tablet (30 mg total) by mouth daily. 30 tablet 6  . ivabradine (CORLANOR) 5 MG TABS tablet Take 1 tablet (5 mg total) by mouth 2 (two) times daily with a meal. 60 tablet 6  . Magnesium 250 MG TABS Take one tablet by mouth every other day  0  . Multiple Vitamins-Minerals (MULTIVITAMIN WITH MINERALS) tablet Take 1 tablet by mouth daily.    . rosuvastatin (CRESTOR) 40 MG tablet TAKE 1 TABLET BY MOUTH DAILY 90 tablet 0  . spironolactone (ALDACTONE) 25 MG tablet Take 0.5 tablets (12.5 mg total) by mouth daily. 15 tablet 6  . tamsulosin (FLOMAX) 0.4 MG CAPS capsule Take 1 capsule (0.4 mg total) by mouth daily. 30 capsule 6  . torsemide (DEMADEX) 20 MG tablet Take 2 tablets (40 mg total) by mouth 2 (two) times daily. 130 tablet 6   No current facility-administered medications on file prior to visit.     Past Medical History  Diagnosis Date  . Hypertension   . Chronic combined systolic and diastolic heart failure ([place])   . Peripheral neuropathy ([place])   . Gout   . Hx of echocardiogram 2015    Echo (08/2013):  EF 10-15%, diff HK, mod LAE, severe RVE, mod reduced RVSF, mild RAE, PASP 41 mmHg.  . Ischemic cardiomyopathy     Echo (8/11):  EF 40-45%;  Echo (5/15):  EF 10-15%  . CAD (coronary artery disease)     a. LHC (08/2005):  Ostial LAD 99%, mid LAD 50%, superior D1 80%, inferior D1 75%, mid OM proximal 80%, proximal PDA 25-30%, mid RCA 99%.   MRI with full thickness scar.  Med Rx.  2015 demonstrated similar diffuse three-vessel coronary disease. Perfusion imaging with rest we distribution demonstrated no viability in the area of the LAD and circumflex. Medical management.  Marland Kitchen NSVT (nonsustained ventricular tachycardia) (HCC)     s/p ICD  . CKD (chronic kidney disease)   . Automatic implantable cardioverter-defibrillator in situ   . Myocardial infarction [facility]) 2007    out of hosp MI - LHC with 3v  CAD rx medically  . Sleep apnea     "has CPAP; won't use it"  . Stroke [facility]) ~ 2013    "right leg weak since" (12/09/2013)  . Type II diabetes mellitus ([place]) dx'd 2005  . CAD, multiple vessel, RCA 100% mid occl.; LAD 99% stenosisprox.  mid of 50-60%; LCX OM-1 90%, OM-2 99%,AV groove 80%  12/10/2013   No Known Allergies  Social History   Social History  . Marital Status: Married    Spouse Name: N/A  . Number of Children: N/A  . Years of Education: N/A   Social History Main Topics  . Smoking status: Never Smoker   . Smokeless tobacco: Never Used  . Alcohol Use: No  . Drug Use: No  . Sexual Activity: Not Asked   Other Topics Concern  .  None   Social History Narrative    Filed Vitals:   10/14/15 1300  BP: 100/62  Pulse: 100  Temp: 98 F (36.7 C)   Body mass index is 18.17 kg/(m^2).  Wt Readings from Last 3 Encounters:  10/14/15 141 lb 9.6 oz (64.229 kg)  09/20/15 152 lb (68.947 kg)  09/02/15 155 lb (70.308 kg)   SpO2 Readings from Last 3 Encounters:  10/14/15 99%  09/20/15 100%  09/02/15 90%     Physical Exam  Constitutional: He is oriented to person, place, and time. He appears well-developed. No distress.  underwt  HENT:  Head: Atraumatic.  Mouth/Throat: Oropharynx is clear and moist and mucous membranes are normal.  Eyes: Conjunctivae and EOM are normal.  Neck: No JVD present. No thyromegaly present.  Cardiovascular: Normal rate and regular rhythm.   Murmur (II/VI LUSB>RUSB) heard. Pulses:      Dorsalis pedis pulses are 2+ on the right side, and 2+ on the left side.  Respiratory: Effort normal and breath sounds normal. No respiratory distress.  RR 16/min.  GI: Soft. He exhibits no mass. There is no hepatomegaly. There is no tenderness.  Musculoskeletal: He exhibits edema (trace pitting edema LE bilateral). He exhibits no tenderness.  Lymphadenopathy:    He has no cervical adenopathy.  Neurological: He is alert and oriented to person, place, and time.  He has normal strength. Coordination normal.  Slow gait, mildly unstable when he first get up from sitting position.  Skin: Skin is warm. No erythema.  Psychiatric: He has a normal mood and affect.  Well groomed, good eye contact.      ASSESSMENT AND PLAN:     Rodd was seen today for follow-up.  Diagnoses and all orders for this visit:   Weight loss  Due to decreased food intake. Caution with hypoglycemias, recommended small and frequent meals/food intake. It is difficult for him given his Hx of CKD and CHF, which require some dietary restrictions as part of treatment. Mirtazapine may help with appetite and there fore with wt loss. We discussed some side effects including risk of falls and hyperglycemia. He will try Mirtazapine 1/2 tab, it may also help with ? of underlying depression. F/U in 6-8 weeks, before if needed.   -     mirtazapine (REMERON) 15 MG tablet; Take 1 tablet (15 mg total) by mouth at bedtime.   Dysphagia  According to pt stable and chronic. We discussed the option of having a swallowing test but he is not interested. Recommend small pieces at the time, slow eating, and chewing well.   Essential hypertension  Orthostatic:  Lying down 100/60 HR 76/min Sitting 80/55 HR 96/min  Decrease Hydralazine from 15 mg tid to 10 mg tid. No changes in rest of his meds. Monitor BP at home. Keeps appt with CHF clinic in a week.   Dizziness and giddiness  Fall precautions discussed. High risk of falls, strongly recommended using assistance, cane or walker. PT was already done, recommend doing exercises 2-3 times per week.       -He was advised to return or notify a doctor immediately if symptoms worsen or persist or new concerns arise.       [name] G. [name], MD  [facility]. [place] office.

## 2015-10-14 NOTE — Patient Instructions (Signed)
A few things to remember from today's visit:   1. Essential hypertension   2. Dysphagia   3. Dizziness and giddiness   4. Weight loss  Changes today: Decrease Hydralazine from 15 mg to 10 mg 3 times daily. Monitor blood pressure. Move slowly. Use a walker.  Mirtazapine may help with appetite, start 1/2 tab at bedtime. Keep appt with cardiologists.      If you sign-up for My chart, you can communicate easier with [place] in case you have any question or concern.

## 2015-10-14 NOTE — Progress Notes (Signed)
Pre visit review using our clinic review tool, if applicable. No additional management support is needed unless otherwise documented below in the visit note. 

## 2015-10-15 ENCOUNTER — Encounter: Payer: Self-pay | Admitting: Family Medicine

## 2015-10-17 ENCOUNTER — Other Ambulatory Visit: Payer: Self-pay

## 2015-10-17 NOTE — Patient Outreach (Signed)
[facility]) Care Management  [place]  10/17/2015   [name] [name] 1945/08/26 [id number]  Subjective: Telephone call to patient for initial assessment.  Patient reports he is doing ok.  Saw his primary doctor last week as patient has some appetite loss. Patient reports that new medication was ordered for appetite but no difference yet. Patient continues to weight daily.  Denies swelling or shortness of breath.  Patient reports that his sugars are still up and down. Patient did not give specific numbers.  Discussed with patient importance of limiting carbohydrates and sweets in order to control sugars.  He verbalized understanding.    Objective:   Encounter Medications:  Outpatient Encounter Prescriptions as of 10/17/2015  Medication Sig Note  . aspirin EC 81 MG tablet Take 81 mg by mouth daily.   . digoxin (LANOXIN) 0.125 MG tablet Take 0.5 tablets (0.0625 mg total) by mouth daily. 09/20/2015: Correction-Has .125mg  tablets, states he is breaking the tablet in-half for .625mg  daily.   . hydrALAZINE (APRESOLINE) 10 MG tablet Take 10 mg by mouth 3 (three) times daily. Decreased.   . insulin aspart (NOVOLOG FLEXPEN) 100 UNIT/ML FlexPen Inject 5-15 Units into the skin 3 (three) times daily with meals.   . insulin glargine (LANTUS) 100 UNIT/ML injection Inject 20 Units into the skin at bedtime.   . Insulin Pen Needle (BD PEN NEEDLE NANO U/F) 32G X 4 MM MISC Test once daily dx e10.9   . isosorbide mononitrate (IMDUR) 30 MG 24 hr tablet Take 1 tablet (30 mg total) by mouth daily.   . ivabradine (CORLANOR) 5 MG TABS tablet Take 1 tablet (5 mg total) by mouth 2 (two) times daily with a meal.   . Magnesium 250 MG TABS Take one tablet by mouth every other day   . mirtazapine (REMERON) 15 MG tablet Take 1 tablet (15 mg total) by mouth at bedtime.   . Multiple Vitamins-Minerals (MULTIVITAMIN WITH MINERALS) tablet Take 1 tablet by mouth daily.   . rosuvastatin (CRESTOR) 40 MG  tablet TAKE 1 TABLET BY MOUTH DAILY   . spironolactone (ALDACTONE) 25 MG tablet Take 0.5 tablets (12.5 mg total) by mouth daily.   . tamsulosin (FLOMAX) 0.4 MG CAPS capsule Take 1 capsule (0.4 mg total) by mouth daily.   Marland Kitchen torsemide (DEMADEX) 20 MG tablet Take 2 tablets (40 mg total) by mouth 2 (two) times daily.    No facility-administered encounter medications on file as of 10/17/2015.    Functional Status:  In your present state of health, do you have any difficulty performing the following activities: 10/17/2015 08/08/2015  Hearing? N N  Vision? N N  Difficulty concentrating or making decisions? N N  Walking or climbing stairs? N N  Dressing or bathing? N N  Doing errands, shopping? N N  Preparing Food and eating ? N N  Using the Toilet? N N  In the past six months, have you accidently leaked urine? N N  Do you have problems with loss of bowel control? N N  Managing your Medications? N N  Managing your Finances? N N  Housekeeping or managing your Housekeeping? N N    Fall/Depression Screening: PHQ 2/9 Scores 10/17/2015 08/18/2015 08/08/2015 07/25/2015  PHQ - 2 Score 0 0 0 0    Assessment: Patient will benefit from health coach outreach for disease management and support.   Plan:  [facility] CM Care Plan Problem Three        Most Recent Value  Care Plan Problem Three  knowledge deficit regarding diabaetes management   Role Documenting the Problem [place] for Problem Three  Active   [facility] Long Term Goal (31-90) days  member will express at least 3 strategies for managing blood sugar within the next 31-90 days.   [facility] Long Term Goal Start Date  09/20/15   Interventions for Problem Three Long Term Goal  RN Health coach discussed limiting carbohydrates and sweets.  Discussed how maintaining activity will help lower blood sugars.       RN Health Coach will provide ongoing education for patient on diabetes through phone calls and sending printed information to patient  for further discussion.  RN Health Coach will sent welcome letter.  RN Health Coach will send initial barriers letter, assessment, and care plan to primary care physician.  RN Health Coach will contact patient within one month and patient agrees to next contact.   Jone Baseman, RN, MSN [place] [phone]

## 2015-10-20 ENCOUNTER — Encounter (HOSPITAL_COMMUNITY): Payer: Self-pay | Admitting: Internal Medicine

## 2015-10-20 ENCOUNTER — Ambulatory Visit (HOSPITAL_COMMUNITY)
Admission: RE | Admit: 2015-10-20 | Discharge: 2015-10-20 | Disposition: A | Discharge status: Home / Self Care | Payer: Commercial Managed Care - HMO | Source: Ambulatory Visit | Attending: Internal Medicine | Admitting: Internal Medicine

## 2015-10-20 ENCOUNTER — Encounter: Payer: Self-pay | Admitting: Internal Medicine

## 2015-10-20 VITALS — BP 110/72 | HR 102 | Resp 18 | Wt 142.5 lb

## 2015-10-20 DIAGNOSIS — N184 Chronic kidney disease, stage 4 (severe): Secondary | ICD-10-CM | POA: Insufficient documentation

## 2015-10-20 DIAGNOSIS — I2510 Atherosclerotic heart disease of native coronary artery without angina pectoris: Secondary | ICD-10-CM | POA: Diagnosis not present

## 2015-10-20 DIAGNOSIS — Z7982 Long term (current) use of aspirin: Secondary | ICD-10-CM | POA: Insufficient documentation

## 2015-10-20 DIAGNOSIS — I130 Hypertensive heart and chronic kidney disease with heart failure and stage 1 through stage 4 chronic kidney disease, or unspecified chronic kidney disease: Secondary | ICD-10-CM | POA: Insufficient documentation

## 2015-10-20 DIAGNOSIS — I5042 Chronic combined systolic (congestive) and diastolic (congestive) heart failure: Secondary | ICD-10-CM

## 2015-10-20 DIAGNOSIS — N189 Chronic kidney disease, unspecified: Secondary | ICD-10-CM | POA: Diagnosis not present

## 2015-10-20 DIAGNOSIS — Z95810 Presence of automatic (implantable) cardiac defibrillator: Secondary | ICD-10-CM | POA: Diagnosis not present

## 2015-10-20 DIAGNOSIS — N320 Bladder-neck obstruction: Secondary | ICD-10-CM | POA: Diagnosis not present

## 2015-10-20 DIAGNOSIS — N183 Chronic kidney disease, stage 3 unspecified: Secondary | ICD-10-CM

## 2015-10-20 DIAGNOSIS — I252 Old myocardial infarction: Secondary | ICD-10-CM | POA: Insufficient documentation

## 2015-10-20 DIAGNOSIS — E1122 Type 2 diabetes mellitus with diabetic chronic kidney disease: Secondary | ICD-10-CM | POA: Diagnosis not present

## 2015-10-20 DIAGNOSIS — R0602 Shortness of breath: Secondary | ICD-10-CM | POA: Insufficient documentation

## 2015-10-20 DIAGNOSIS — E1142 Type 2 diabetes mellitus with diabetic polyneuropathy: Secondary | ICD-10-CM | POA: Diagnosis not present

## 2015-10-20 DIAGNOSIS — R972 Elevated prostate specific antigen [PSA]: Secondary | ICD-10-CM | POA: Insufficient documentation

## 2015-10-20 DIAGNOSIS — R253 Fasciculation: Secondary | ICD-10-CM

## 2015-10-20 DIAGNOSIS — R42 Dizziness and giddiness: Secondary | ICD-10-CM | POA: Diagnosis not present

## 2015-10-20 DIAGNOSIS — Z794 Long term (current) use of insulin: Secondary | ICD-10-CM | POA: Insufficient documentation

## 2015-10-20 DIAGNOSIS — R258 Other abnormal involuntary movements: Secondary | ICD-10-CM | POA: Diagnosis not present

## 2015-10-20 DIAGNOSIS — I255 Ischemic cardiomyopathy: Secondary | ICD-10-CM | POA: Insufficient documentation

## 2015-10-20 LAB — BASIC METABOLIC PANEL
ANION GAP: 12 (ref 5–15)
BUN: 36 mg/dL — ABNORMAL HIGH (ref 6–20)
CALCIUM: 9.1 mg/dL (ref 8.9–10.3)
CHLORIDE: 99 mmol/L — AB (ref 101–111)
CO2: 25 mmol/L (ref 22–32)
Creatinine, Ser: 2.2 mg/dL — ABNORMAL HIGH (ref 0.61–1.24)
GFR calc non Af Amer: 29 mL/min — ABNORMAL LOW (ref >60)
GFR, EST AFRICAN AMERICAN: 33 mL/min — AB (ref >60)
Glucose, Bld: 487 mg/dL — ABNORMAL HIGH (ref 65–99)
Potassium: 4.4 mmol/L (ref 3.5–5.1)
Sodium: 136 mmol/L (ref 135–145)

## 2015-10-20 MED ORDER — TORSEMIDE 20 MG PO TABS: 40.0000 mg | ORAL_TABLET | Freq: Every day | ORAL | Status: DC

## 2015-10-20 NOTE — Patient Instructions (Signed)
DECREASE Torsemide to 40 mg once daily.  Routine lab work today. Will notify you of abnormal results, otherwise no news is good news!  Will refer you to neurology.  Follow up 2 months with Dr. Haroldine Laws.  Do the following things EVERYDAY: 1) Weigh yourself in the morning before breakfast. Write it down and keep it in a log. 2) Take your medicines as prescribed 3) Eat low salt foods-Limit salt (sodium) to 2000 mg per day.  4) Stay as active as you can everyday 5) Limit all fluids for the day to less than 2 liters
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
## 2015-10-20 NOTE — Addendum Note (Signed)
Encounter addended by: Effie Berkshire, RN on: 10/20/2015  3:00 PM<BR>     Documentation filed: Visit Diagnoses, Orders, Dx Association, Patient Instructions Section

## 2015-10-20 NOTE — Addendum Note (Signed)
Encounter addended by: Effie Berkshire, RN on: 10/20/2015  4:04 PM<BR>     Documentation filed: Dx Association, Orders

## 2015-10-24 ENCOUNTER — Telehealth (HOSPITAL_COMMUNITY): Payer: Self-pay | Admitting: Vascular Surgery

## 2015-10-24 NOTE — Telephone Encounter (Signed)
Pt wife called , pt has appt w/ Neuro 12/05/15 , that is the first available appt w/ Neuro pt wife wants him to be seen sooner...  Please advise

## 2015-10-26 ENCOUNTER — Telehealth: Payer: Self-pay | Admitting: Neurology

## 2015-10-26 NOTE — Telephone Encounter (Signed)
Pt daughter called and states that the patient hand is locked and they are having to pull it open and they would like the patient seen sooner. He is slowly losing the use of hand.Marland Kitchen i have him on a wait list but she would and thinks he needs to be seen ASAP please call wendy at [phone] please advise

## 2015-10-26 NOTE — Telephone Encounter (Signed)
Please place him on the wait-list and we will try our best to see him sooner.  Unfortunately, I do not have any open at this time. I do not see anything in the Epic notes reflecting his hand symptoms - have they been assessed by his PCP yet?

## 2015-10-27 ENCOUNTER — Ambulatory Visit: Payer: Commercial Managed Care - HMO | Admitting: Internal Medicine

## 2015-10-27 ENCOUNTER — Emergency Department (HOSPITAL_COMMUNITY): Payer: Commercial Managed Care - HMO

## 2015-10-27 ENCOUNTER — Encounter (HOSPITAL_COMMUNITY): Payer: Self-pay | Admitting: *Deleted

## 2015-10-27 ENCOUNTER — Inpatient Hospital Stay (HOSPITAL_COMMUNITY): Payer: Commercial Managed Care - HMO

## 2015-10-27 ENCOUNTER — Inpatient Hospital Stay (HOSPITAL_COMMUNITY)
Admission: EM | Admit: 2015-10-27 | Discharge: 2015-11-06 | DRG: 853 | Disposition: A | Discharge status: Skilled Nursing Facility | Payer: Commercial Managed Care - HMO | Attending: Internal Medicine | Admitting: Internal Medicine

## 2015-10-27 DIAGNOSIS — E118 Type 2 diabetes mellitus with unspecified complications: Secondary | ICD-10-CM

## 2015-10-27 DIAGNOSIS — I213 ST elevation (STEMI) myocardial infarction of unspecified site: Secondary | ICD-10-CM | POA: Diagnosis not present

## 2015-10-27 DIAGNOSIS — Z781 Physical restraint status: Secondary | ICD-10-CM

## 2015-10-27 DIAGNOSIS — Z95810 Presence of automatic (implantable) cardiac defibrillator: Secondary | ICD-10-CM

## 2015-10-27 DIAGNOSIS — I130 Hypertensive heart and chronic kidney disease with heart failure and stage 1 through stage 4 chronic kidney disease, or unspecified chronic kidney disease: Secondary | ICD-10-CM | POA: Diagnosis not present

## 2015-10-27 DIAGNOSIS — R4781 Slurred speech: Secondary | ICD-10-CM | POA: Diagnosis not present

## 2015-10-27 DIAGNOSIS — E1122 Type 2 diabetes mellitus with diabetic chronic kidney disease: Secondary | ICD-10-CM | POA: Diagnosis present

## 2015-10-27 DIAGNOSIS — J9621 Acute and chronic respiratory failure with hypoxia: Secondary | ICD-10-CM | POA: Diagnosis not present

## 2015-10-27 DIAGNOSIS — D649 Anemia, unspecified: Secondary | ICD-10-CM | POA: Diagnosis not present

## 2015-10-27 DIAGNOSIS — I5042 Chronic combined systolic (congestive) and diastolic (congestive) heart failure: Secondary | ICD-10-CM | POA: Diagnosis present

## 2015-10-27 DIAGNOSIS — I472 Ventricular tachycardia, unspecified: Secondary | ICD-10-CM | POA: Insufficient documentation

## 2015-10-27 DIAGNOSIS — L97529 Non-pressure chronic ulcer of other part of left foot with unspecified severity: Secondary | ICD-10-CM | POA: Diagnosis present

## 2015-10-27 DIAGNOSIS — N189 Chronic kidney disease, unspecified: Secondary | ICD-10-CM

## 2015-10-27 DIAGNOSIS — G4733 Obstructive sleep apnea (adult) (pediatric): Secondary | ICD-10-CM | POA: Diagnosis present

## 2015-10-27 DIAGNOSIS — Z452 Encounter for adjustment and management of vascular access device: Secondary | ICD-10-CM

## 2015-10-27 DIAGNOSIS — Z7982 Long term (current) use of aspirin: Secondary | ICD-10-CM

## 2015-10-27 DIAGNOSIS — I2721 Secondary pulmonary arterial hypertension: Secondary | ICD-10-CM | POA: Diagnosis present

## 2015-10-27 DIAGNOSIS — G8191 Hemiplegia, unspecified affecting right dominant side: Secondary | ICD-10-CM | POA: Diagnosis not present

## 2015-10-27 DIAGNOSIS — J9600 Acute respiratory failure, unspecified whether with hypoxia or hypercapnia: Secondary | ICD-10-CM | POA: Diagnosis not present

## 2015-10-27 DIAGNOSIS — N390 Urinary tract infection, site not specified: Secondary | ICD-10-CM | POA: Diagnosis not present

## 2015-10-27 DIAGNOSIS — M109 Gout, unspecified: Secondary | ICD-10-CM | POA: Diagnosis present

## 2015-10-27 DIAGNOSIS — I5022 Chronic systolic (congestive) heart failure: Secondary | ICD-10-CM | POA: Diagnosis not present

## 2015-10-27 DIAGNOSIS — N183 Chronic kidney disease, stage 3 unspecified: Secondary | ICD-10-CM | POA: Diagnosis present

## 2015-10-27 DIAGNOSIS — J690 Pneumonitis due to inhalation of food and vomit: Secondary | ICD-10-CM | POA: Diagnosis not present

## 2015-10-27 DIAGNOSIS — E785 Hyperlipidemia, unspecified: Secondary | ICD-10-CM | POA: Diagnosis not present

## 2015-10-27 DIAGNOSIS — G9340 Encephalopathy, unspecified: Secondary | ICD-10-CM | POA: Diagnosis not present

## 2015-10-27 DIAGNOSIS — E1142 Type 2 diabetes mellitus with diabetic polyneuropathy: Secondary | ICD-10-CM | POA: Diagnosis present

## 2015-10-27 DIAGNOSIS — I252 Old myocardial infarction: Secondary | ICD-10-CM

## 2015-10-27 DIAGNOSIS — E11649 Type 2 diabetes mellitus with hypoglycemia without coma: Secondary | ICD-10-CM | POA: Diagnosis present

## 2015-10-27 DIAGNOSIS — I509 Heart failure, unspecified: Secondary | ICD-10-CM | POA: Diagnosis not present

## 2015-10-27 DIAGNOSIS — R4182 Altered mental status, unspecified: Secondary | ICD-10-CM | POA: Diagnosis not present

## 2015-10-27 DIAGNOSIS — J9690 Respiratory failure, unspecified, unspecified whether with hypoxia or hypercapnia: Secondary | ICD-10-CM | POA: Diagnosis not present

## 2015-10-27 DIAGNOSIS — E875 Hyperkalemia: Secondary | ICD-10-CM | POA: Diagnosis not present

## 2015-10-27 DIAGNOSIS — E11621 Type 2 diabetes mellitus with foot ulcer: Secondary | ICD-10-CM | POA: Diagnosis present

## 2015-10-27 DIAGNOSIS — I272 Other secondary pulmonary hypertension: Secondary | ICD-10-CM

## 2015-10-27 DIAGNOSIS — R05 Cough: Secondary | ICD-10-CM | POA: Diagnosis not present

## 2015-10-27 DIAGNOSIS — E872 Acidosis: Secondary | ICD-10-CM | POA: Diagnosis present

## 2015-10-27 DIAGNOSIS — I214 Non-ST elevation (NSTEMI) myocardial infarction: Secondary | ICD-10-CM

## 2015-10-27 DIAGNOSIS — R471 Dysarthria and anarthria: Secondary | ICD-10-CM | POA: Diagnosis not present

## 2015-10-27 DIAGNOSIS — N179 Acute kidney failure, unspecified: Secondary | ICD-10-CM | POA: Diagnosis not present

## 2015-10-27 DIAGNOSIS — Z794 Long term (current) use of insulin: Secondary | ICD-10-CM

## 2015-10-27 DIAGNOSIS — I5043 Acute on chronic combined systolic (congestive) and diastolic (congestive) heart failure: Secondary | ICD-10-CM | POA: Diagnosis present

## 2015-10-27 DIAGNOSIS — I255 Ischemic cardiomyopathy: Secondary | ICD-10-CM | POA: Diagnosis present

## 2015-10-27 DIAGNOSIS — I5033 Acute on chronic diastolic (congestive) heart failure: Secondary | ICD-10-CM

## 2015-10-27 DIAGNOSIS — R058 Other specified cough: Secondary | ICD-10-CM | POA: Diagnosis present

## 2015-10-27 DIAGNOSIS — A419 Sepsis, unspecified organism: Principal | ICD-10-CM

## 2015-10-27 DIAGNOSIS — IMO0002 Reserved for concepts with insufficient information to code with codable children: Secondary | ICD-10-CM | POA: Diagnosis present

## 2015-10-27 DIAGNOSIS — R739 Hyperglycemia, unspecified: Secondary | ICD-10-CM | POA: Diagnosis not present

## 2015-10-27 DIAGNOSIS — I639 Cerebral infarction, unspecified: Secondary | ICD-10-CM | POA: Diagnosis not present

## 2015-10-27 DIAGNOSIS — R410 Disorientation, unspecified: Secondary | ICD-10-CM | POA: Diagnosis not present

## 2015-10-27 DIAGNOSIS — I2510 Atherosclerotic heart disease of native coronary artery without angina pectoris: Secondary | ICD-10-CM | POA: Diagnosis not present

## 2015-10-27 DIAGNOSIS — A498 Other bacterial infections of unspecified site: Secondary | ICD-10-CM | POA: Diagnosis present

## 2015-10-27 DIAGNOSIS — J9602 Acute respiratory failure with hypercapnia: Secondary | ICD-10-CM

## 2015-10-27 DIAGNOSIS — R278 Other lack of coordination: Secondary | ICD-10-CM | POA: Diagnosis not present

## 2015-10-27 DIAGNOSIS — J189 Pneumonia, unspecified organism: Secondary | ICD-10-CM

## 2015-10-27 DIAGNOSIS — E1165 Type 2 diabetes mellitus with hyperglycemia: Secondary | ICD-10-CM | POA: Diagnosis present

## 2015-10-27 DIAGNOSIS — R41841 Cognitive communication deficit: Secondary | ICD-10-CM | POA: Diagnosis not present

## 2015-10-27 DIAGNOSIS — I5023 Acute on chronic systolic (congestive) heart failure: Secondary | ICD-10-CM | POA: Diagnosis not present

## 2015-10-27 DIAGNOSIS — Z8673 Personal history of transient ischemic attack (TIA), and cerebral infarction without residual deficits: Secondary | ICD-10-CM | POA: Diagnosis not present

## 2015-10-27 DIAGNOSIS — J9601 Acute respiratory failure with hypoxia: Secondary | ICD-10-CM | POA: Diagnosis not present

## 2015-10-27 DIAGNOSIS — M6281 Muscle weakness (generalized): Secondary | ICD-10-CM | POA: Diagnosis not present

## 2015-10-27 DIAGNOSIS — Z79899 Other long term (current) drug therapy: Secondary | ICD-10-CM

## 2015-10-27 DIAGNOSIS — E878 Other disorders of electrolyte and fluid balance, not elsewhere classified: Secondary | ICD-10-CM | POA: Diagnosis not present

## 2015-10-27 DIAGNOSIS — G4089 Other seizures: Secondary | ICD-10-CM | POA: Diagnosis present

## 2015-10-27 DIAGNOSIS — B961 Klebsiella pneumoniae [K. pneumoniae] as the cause of diseases classified elsewhere: Secondary | ICD-10-CM | POA: Diagnosis present

## 2015-10-27 DIAGNOSIS — E1121 Type 2 diabetes mellitus with diabetic nephropathy: Secondary | ICD-10-CM | POA: Diagnosis present

## 2015-10-27 DIAGNOSIS — R569 Unspecified convulsions: Secondary | ICD-10-CM

## 2015-10-27 DIAGNOSIS — R482 Apraxia: Secondary | ICD-10-CM | POA: Diagnosis not present

## 2015-10-27 DIAGNOSIS — R4181 Age-related cognitive decline: Secondary | ICD-10-CM | POA: Diagnosis not present

## 2015-10-27 LAB — COMPREHENSIVE METABOLIC PANEL
ALBUMIN: 3.2 g/dL — AB (ref 3.5–5.0)
ALK PHOS: 142 U/L — AB (ref 38–126)
ALT: 27 U/L (ref 17–63)
ALT: 27 U/L (ref 17–63)
ANION GAP: 13 (ref 5–15)
ANION GAP: 9 (ref 5–15)
AST: 35 U/L (ref 15–41)
AST: 60 U/L — ABNORMAL HIGH (ref 15–41)
Albumin: 2.8 g/dL — ABNORMAL LOW (ref 3.5–5.0)
Alkaline Phosphatase: 125 U/L (ref 38–126)
BUN: 27 mg/dL — AB (ref 6–20)
BUN: 25 mg/dL — ABNORMAL HIGH (ref 6–20)
CHLORIDE: 106 mmol/L (ref 101–111)
CO2: 23 mmol/L (ref 22–32)
CO2: 24 mmol/L (ref 22–32)
CREATININE: 1.61 mg/dL — AB (ref 0.61–1.24)
Calcium: 9.1 mg/dL (ref 8.9–10.3)
Calcium: 8.2 mg/dL — ABNORMAL LOW (ref 8.9–10.3)
Chloride: 101 mmol/L (ref 101–111)
Creatinine, Ser: 1.9 mg/dL — ABNORMAL HIGH (ref 0.61–1.24)
GFR calc Af Amer: 40 mL/min — ABNORMAL LOW (ref >60)
GFR calc non Af Amer: 34 mL/min — ABNORMAL LOW (ref >60)
GFR, EST AFRICAN AMERICAN: 49 mL/min — AB (ref >60)
GFR, EST NON AFRICAN AMERICAN: 42 mL/min — AB (ref >60)
GLUCOSE: 552 mg/dL — AB (ref 65–99)
Glucose, Bld: 457 mg/dL — ABNORMAL HIGH (ref 65–99)
POTASSIUM: 4.9 mmol/L (ref 3.5–5.1)
Potassium: 5.6 mmol/L — ABNORMAL HIGH (ref 3.5–5.1)
SODIUM: 137 mmol/L (ref 135–145)
SODIUM: 139 mmol/L (ref 135–145)
Total Bilirubin: 0.8 mg/dL (ref 0.3–1.2)
Total Bilirubin: 1.3 mg/dL — ABNORMAL HIGH (ref 0.3–1.2)
Total Protein: 7.2 g/dL (ref 6.5–8.1)
Total Protein: 6.3 g/dL — ABNORMAL LOW (ref 6.5–8.1)

## 2015-10-27 LAB — POCT I-STAT 3, ART BLOOD GAS (G3+)
Acid-Base Excess: 3 mmol/L — ABNORMAL HIGH (ref 0.0–2.0)
BICARBONATE: 24.6 meq/L — AB (ref 20.0–24.0)
Bicarbonate: 25.5 mEq/L — ABNORMAL HIGH (ref 20.0–24.0)
O2 Saturation: 100 %
O2 Saturation: 99 %
PCO2 ART: 32 mmHg — AB (ref 35.0–45.0)
PH ART: 7.509 — AB (ref 7.350–7.450)
PH ART: 7.403 (ref 7.350–7.450)
Patient temperature: 98.6
TCO2: 26 mmol/L (ref 0–100)
TCO2: 26 mmol/L (ref 0–100)
pCO2 arterial: 39.4 mmHg (ref 35.0–45.0)
pO2, Arterial: 430 mmHg — ABNORMAL HIGH (ref 80.0–100.0)
pO2, Arterial: 134 mmHg — ABNORMAL HIGH (ref 80.0–100.0)

## 2015-10-27 LAB — CBC
HCT: 38 % — ABNORMAL LOW (ref 39.0–52.0)
HEMATOCRIT: 35.2 % — AB (ref 39.0–52.0)
HEMOGLOBIN: 12 g/dL — AB (ref 13.0–17.0)
Hemoglobin: 11.5 g/dL — ABNORMAL LOW (ref 13.0–17.0)
MCH: 27.8 pg (ref 26.0–34.0)
MCH: 28.5 pg (ref 26.0–34.0)
MCHC: 31.6 g/dL (ref 30.0–36.0)
MCHC: 32.7 g/dL (ref 30.0–36.0)
MCV: 88.2 fL (ref 78.0–100.0)
MCV: 87.3 fL (ref 78.0–100.0)
PLATELETS: 224 10*3/uL (ref 150–400)
PLATELETS: 163 10*3/uL (ref 150–400)
RBC: 4.31 MIL/uL (ref 4.22–5.81)
RBC: 4.03 MIL/uL — ABNORMAL LOW (ref 4.22–5.81)
RDW: 15.4 % (ref 11.5–15.5)
RDW: 15.4 % (ref 11.5–15.5)
WBC: 18.3 10*3/uL — ABNORMAL HIGH (ref 4.0–10.5)
WBC: 15.9 10*3/uL — AB (ref 4.0–10.5)

## 2015-10-27 LAB — TROPONIN I
Troponin I: 1.36 ng/mL (ref <0.03)
Troponin I: 20.31 ng/mL (ref <0.03)

## 2015-10-27 LAB — I-STAT CHEM 8, ED
BUN: 33 mg/dL — AB (ref 6–20)
CHLORIDE: 102 mmol/L (ref 101–111)
CREATININE: 1.5 mg/dL — AB (ref 0.61–1.24)
Calcium, Ion: 1.08 mmol/L — ABNORMAL LOW (ref 1.12–1.23)
Glucose, Bld: 548 mg/dL (ref 65–99)
HEMATOCRIT: 40 % (ref 39.0–52.0)
Hemoglobin: 13.6 g/dL (ref 13.0–17.0)
POTASSIUM: 5 mmol/L (ref 3.5–5.1)
Sodium: 140 mmol/L (ref 135–145)
TCO2: 25 mmol/L (ref 0–100)

## 2015-10-27 LAB — URINE MICROSCOPIC-ADD ON

## 2015-10-27 LAB — ETHANOL: Alcohol, Ethyl (B): <5 mg/dL (ref <5)

## 2015-10-27 LAB — RAPID URINE DRUG SCREEN, HOSP PERFORMED
AMPHETAMINES: NOT DETECTED
BARBITURATES: NOT DETECTED
BENZODIAZEPINES: NOT DETECTED
COCAINE: NOT DETECTED
OPIATES: NOT DETECTED
TETRAHYDROCANNABINOL: NOT DETECTED

## 2015-10-27 LAB — I-STAT ARTERIAL BLOOD GAS, ED
BICARBONATE: 24.4 meq/L — AB (ref 20.0–24.0)
O2 Saturation: 94 %
PO2 ART: 68 mmHg — AB (ref 80.0–100.0)
TCO2: 26 mmol/L (ref 0–100)
pCO2 arterial: 37.3 mmHg (ref 35.0–45.0)
pH, Arterial: 7.425 (ref 7.350–7.450)

## 2015-10-27 LAB — GLUCOSE, CAPILLARY
GLUCOSE-CAPILLARY: 425 mg/dL — AB (ref 65–99)
GLUCOSE-CAPILLARY: 294 mg/dL — AB (ref 65–99)
GLUCOSE-CAPILLARY: 215 mg/dL — AB (ref 65–99)
GLUCOSE-CAPILLARY: 174 mg/dL — AB (ref 65–99)

## 2015-10-27 LAB — URINALYSIS, ROUTINE W REFLEX MICROSCOPIC
BILIRUBIN URINE: NEGATIVE
Ketones, ur: NEGATIVE mg/dL
Nitrite: POSITIVE — AB
PROTEIN: NEGATIVE mg/dL
Specific Gravity, Urine: 1.028 (ref 1.005–1.030)
pH: 5 (ref 5.0–8.0)

## 2015-10-27 LAB — DIFFERENTIAL
BASOS ABS: 0 10*3/uL (ref 0.0–0.1)
Basophils Relative: 0 %
EOS ABS: 0 10*3/uL (ref 0.0–0.7)
EOS PCT: 0 %
LYMPHS PCT: 6 %
Lymphs Abs: 1.1 10*3/uL (ref 0.7–4.0)
Monocytes Absolute: 0.6 10*3/uL (ref 0.1–1.0)
Monocytes Relative: 3 %
NEUTROS PCT: 91 %
Neutro Abs: 16.6 10*3/uL — ABNORMAL HIGH (ref 1.7–7.7)

## 2015-10-27 LAB — PHOSPHORUS: PHOSPHORUS: 3.1 mg/dL (ref 2.5–4.6)

## 2015-10-27 LAB — PROCALCITONIN: Procalcitonin: 0.33 ng/mL

## 2015-10-27 LAB — PROTIME-INR
INR: 1.1 (ref 0.00–1.49)
INR: 1.2 (ref 0.00–1.49)
PROTHROMBIN TIME: 14.4 s (ref 11.6–15.2)
Prothrombin Time: 15.3 seconds — ABNORMAL HIGH (ref 11.6–15.2)

## 2015-10-27 LAB — APTT
APTT: 26 s (ref 24–37)
APTT: 27 s (ref 24–37)

## 2015-10-27 LAB — DIGOXIN LEVEL: Digoxin Level: 0.3 ng/mL — ABNORMAL LOW (ref 0.8–2.0)

## 2015-10-27 LAB — I-STAT TROPONIN, ED: Troponin i, poc: 1.31 ng/mL (ref 0.00–0.08)

## 2015-10-27 LAB — HEPARIN LEVEL (UNFRACTIONATED): HEPARIN UNFRACTIONATED: 0.23 [IU]/mL — AB (ref 0.30–0.70)

## 2015-10-27 LAB — LACTIC ACID, PLASMA: LACTIC ACID, VENOUS: 2.8 mmol/L — AB (ref 0.5–1.9)

## 2015-10-27 LAB — CORTISOL: CORTISOL PLASMA: 41.9 ug/dL

## 2015-10-27 LAB — STREP PNEUMONIAE URINARY ANTIGEN: Strep Pneumo Urinary Antigen: NEGATIVE

## 2015-10-27 LAB — BRAIN NATRIURETIC PEPTIDE: B Natriuretic Peptide: 1318 pg/mL — ABNORMAL HIGH (ref 0.0–100.0)

## 2015-10-27 LAB — MAGNESIUM: MAGNESIUM: 1.8 mg/dL (ref 1.7–2.4)

## 2015-10-27 LAB — I-STAT CG4 LACTIC ACID, ED: Lactic Acid, Venous: 6.11 mmol/L (ref 0.5–1.9)

## 2015-10-27 LAB — POC OCCULT BLOOD, ED: FECAL OCCULT BLD: NEGATIVE

## 2015-10-27 LAB — MRSA PCR SCREENING: MRSA BY PCR: NEGATIVE

## 2015-10-27 MED ORDER — AMIODARONE HCL IN DEXTROSE 360-4.14 MG/200ML-% IV SOLN
30.0000 mg/h | INTRAVENOUS | Status: DC
Start: 2015-10-27 — End: 2015-10-27

## 2015-10-27 MED ORDER — SODIUM CHLORIDE 0.9 % IV BOLUS (SEPSIS)
1000.0000 mL | Freq: Once | INTRAVENOUS | Status: AC
  Administered 2015-10-27: 1000 mL via INTRAVENOUS

## 2015-10-27 MED ORDER — FENTANYL CITRATE (PF) 100 MCG/2ML IJ SOLN
50.0000 ug | INTRAMUSCULAR | Status: AC | PRN
  Administered 2015-10-27 (×3): 50 ug via INTRAVENOUS
  Filled 2015-10-27 (×2): qty 2

## 2015-10-27 MED ORDER — INSULIN ASPART 100 UNIT/ML ~~LOC~~ SOLN
10.0000 [IU] | Freq: Once | SUBCUTANEOUS | Status: AC
  Administered 2015-10-27: 10 [IU] via SUBCUTANEOUS

## 2015-10-27 MED ORDER — FAMOTIDINE IN NACL 20-0.9 MG/50ML-% IV SOLN
20.0000 mg | INTRAVENOUS | Status: DC
  Administered 2015-10-27 – 2015-10-29 (×3): 20 mg via INTRAVENOUS
  Filled 2015-10-27 (×4): qty 50

## 2015-10-27 MED ORDER — MIDAZOLAM HCL 2 MG/2ML IJ SOLN
1.0000 mg | INTRAMUSCULAR | Status: DC | PRN
  Administered 2015-10-29: 1 mg via INTRAVENOUS
  Filled 2015-10-27 (×2): qty 2

## 2015-10-27 MED ORDER — ASPIRIN 325 MG PO TABS
325.0000 mg | ORAL_TABLET | Freq: Every day | ORAL | Status: DC
  Administered 2015-10-27 – 2015-10-31 (×5): 325 mg
  Filled 2015-10-27 (×5): qty 1

## 2015-10-27 MED ORDER — SODIUM CHLORIDE 0.9 % IV BOLUS (SEPSIS)
500.0000 mL | Freq: Once | INTRAVENOUS | Status: AC
  Administered 2015-10-27: 500 mL via INTRAVENOUS

## 2015-10-27 MED ORDER — LORAZEPAM 2 MG/ML IJ SOLN
1.0000 mg | Freq: Once | INTRAMUSCULAR | Status: AC
  Administered 2015-10-27: 1 mg via INTRAVENOUS

## 2015-10-27 MED ORDER — VANCOMYCIN HCL 500 MG IV SOLR
500.0000 mg | Freq: Two times a day (BID) | INTRAVENOUS | Status: DC
  Filled 2015-10-27: qty 500

## 2015-10-27 MED ORDER — HEPARIN (PORCINE) IN NACL 100-0.45 UNIT/ML-% IJ SOLN
1100.0000 [IU]/h | INTRAMUSCULAR | Status: DC
  Administered 2015-10-27: 800 [IU]/h via INTRAVENOUS
  Administered 2015-10-28: 1100 [IU]/h via INTRAVENOUS
  Filled 2015-10-27 (×4): qty 250

## 2015-10-27 MED ORDER — AMPICILLIN-SULBACTAM SODIUM 1.5 (1-0.5) G IJ SOLR
1.5000 g | Freq: Four times a day (QID) | INTRAMUSCULAR | Status: AC
  Administered 2015-10-27 – 2015-11-01 (×21): 1.5 g via INTRAVENOUS
  Filled 2015-10-27 (×25): qty 1.5

## 2015-10-27 MED ORDER — FENTANYL CITRATE (PF) 100 MCG/2ML IJ SOLN
50.0000 ug | INTRAMUSCULAR | Status: DC | PRN
  Administered 2015-10-27: 50 ug via INTRAVENOUS
  Filled 2015-10-27: qty 2

## 2015-10-27 MED ORDER — FENTANYL CITRATE (PF) 100 MCG/2ML IJ SOLN
INTRAMUSCULAR | Status: AC | PRN
  Administered 2015-10-27: 25 ug via INTRAVENOUS

## 2015-10-27 MED ORDER — MIDAZOLAM HCL 2 MG/2ML IJ SOLN
INTRAMUSCULAR | Status: AC | PRN
  Administered 2015-10-27: 1 mg via INTRAVENOUS

## 2015-10-27 MED ORDER — PIPERACILLIN-TAZOBACTAM 3.375 G IVPB: 3.3750 g | Freq: Three times a day (TID) | INTRAVENOUS | Status: DC

## 2015-10-27 MED ORDER — SODIUM CHLORIDE 0.9 % IV SOLN
INTRAVENOUS | Status: DC
  Administered 2015-10-27: 12:00:00 via INTRAVENOUS
  Administered 2015-10-28: 100 mL/h via INTRAVENOUS
  Administered 2015-10-28 – 2015-11-01 (×4): via INTRAVENOUS

## 2015-10-27 MED ORDER — SODIUM CHLORIDE 0.9 % IV BOLUS (SEPSIS): 1000.0000 mL | Freq: Once | INTRAVENOUS | Status: DC

## 2015-10-27 MED ORDER — ANTISEPTIC ORAL RINSE SOLUTION (CORINZ)
7.0000 mL | Freq: Four times a day (QID) | OROMUCOSAL | Status: DC
  Administered 2015-10-27 – 2015-11-05 (×27): 7 mL via OROMUCOSAL

## 2015-10-27 MED ORDER — HEPARIN BOLUS VIA INFUSION
4000.0000 [IU] | Freq: Once | INTRAVENOUS | Status: AC
  Administered 2015-10-27: 4000 [IU] via INTRAVENOUS
  Filled 2015-10-27: qty 4000

## 2015-10-27 MED ORDER — ROCURONIUM BROMIDE 50 MG/5ML IV SOLN
INTRAVENOUS | Status: AC | PRN
  Administered 2015-10-27: 65 mg via INTRAVENOUS

## 2015-10-27 MED ORDER — PIPERACILLIN-TAZOBACTAM 3.375 G IVPB 30 MIN
3.3750 g | Freq: Once | INTRAVENOUS | Status: AC
  Administered 2015-10-27: 3.375 g via INTRAVENOUS
  Filled 2015-10-27: qty 50

## 2015-10-27 MED ORDER — ETOMIDATE 2 MG/ML IV SOLN
INTRAVENOUS | Status: AC | PRN
  Administered 2015-10-27: 20 mg via INTRAVENOUS

## 2015-10-27 MED ORDER — VANCOMYCIN HCL IN DEXTROSE 1-5 GM/200ML-% IV SOLN
1000.0000 mg | Freq: Once | INTRAVENOUS | Status: AC
  Administered 2015-10-27: 1000 mg via INTRAVENOUS
  Filled 2015-10-27: qty 200

## 2015-10-27 MED ORDER — ACETONE (URINE) TEST VI STRP
1.0000 | ORAL_STRIP | Status: DC | PRN
  Filled 2015-10-27: qty 1

## 2015-10-27 MED ORDER — FENTANYL CITRATE (PF) 100 MCG/2ML IJ SOLN
100.0000 ug | Freq: Once | INTRAMUSCULAR | Status: AC
  Administered 2015-10-27: 50 ug via INTRAVENOUS
  Filled 2015-10-27 (×2): qty 2

## 2015-10-27 MED ORDER — ASPIRIN EC 325 MG PO TBEC: 325.0000 mg | DELAYED_RELEASE_TABLET | Freq: Every day | ORAL | Status: DC

## 2015-10-27 MED ORDER — MIDAZOLAM HCL 2 MG/2ML IJ SOLN
4.0000 mg | Freq: Once | INTRAMUSCULAR | Status: DC
  Filled 2015-10-27: qty 4

## 2015-10-27 MED ORDER — MIDAZOLAM HCL 2 MG/2ML IJ SOLN
1.0000 mg | INTRAMUSCULAR | Status: DC | PRN
  Administered 2015-10-27: 1 mg via INTRAVENOUS

## 2015-10-27 MED ORDER — AMIODARONE HCL IN DEXTROSE 360-4.14 MG/200ML-% IV SOLN
30.0000 mg/h | INTRAVENOUS | Status: DC
  Administered 2015-10-27 – 2015-10-31 (×10): 30 mg/h via INTRAVENOUS
  Filled 2015-10-27 (×17): qty 200

## 2015-10-27 MED ORDER — LORAZEPAM 2 MG/ML IJ SOLN
INTRAMUSCULAR | Status: AC
  Administered 2015-10-27: 1 mg
  Filled 2015-10-27: qty 1

## 2015-10-27 MED ORDER — INSULIN ASPART 100 UNIT/ML ~~LOC~~ SOLN
0.0000 [IU] | SUBCUTANEOUS | Status: DC
  Administered 2015-10-27: 3 [IU] via SUBCUTANEOUS
  Administered 2015-10-27: 2 [IU] via SUBCUTANEOUS
  Administered 2015-10-27: 3 [IU] via SUBCUTANEOUS
  Administered 2015-10-28: 1 [IU] via SUBCUTANEOUS
  Administered 2015-10-28: 2 [IU] via SUBCUTANEOUS
  Administered 2015-10-28: 1 [IU] via SUBCUTANEOUS
  Administered 2015-10-28 (×2): 2 [IU] via SUBCUTANEOUS
  Administered 2015-10-29: 1 [IU] via SUBCUTANEOUS
  Administered 2015-10-29 (×2): 7 [IU] via SUBCUTANEOUS
  Administered 2015-10-29: 2 [IU] via SUBCUTANEOUS
  Administered 2015-10-29 (×2): 5 [IU] via SUBCUTANEOUS
  Administered 2015-10-30: 2 [IU] via SUBCUTANEOUS
  Administered 2015-10-30: 1 [IU] via SUBCUTANEOUS
  Administered 2015-10-30: 2 [IU] via SUBCUTANEOUS
  Administered 2015-10-31: 1 [IU] via SUBCUTANEOUS
  Administered 2015-10-31: 3 [IU] via SUBCUTANEOUS
  Administered 2015-10-31 (×2): 2 [IU] via SUBCUTANEOUS

## 2015-10-27 MED ORDER — INSULIN ASPART 100 UNIT/ML ~~LOC~~ SOLN
10.0000 [IU] | Freq: Once | SUBCUTANEOUS | Status: AC
  Administered 2015-10-27: 10 [IU] via INTRAVENOUS

## 2015-10-27 MED ORDER — CHLORHEXIDINE GLUCONATE 0.12% ORAL RINSE (MEDLINE KIT)
15.0000 mL | Freq: Two times a day (BID) | OROMUCOSAL | Status: DC
  Administered 2015-10-27 – 2015-10-31 (×8): 15 mL via OROMUCOSAL

## 2015-10-27 NOTE — Progress Notes (Signed)
ANTICOAGULATION CONSULT NOTE - Initial Consult  Pharmacy Consult for Heparin Indication: chest pain/ACS  No Known Allergies  Patient Measurements: Height: 6\' 2"  (188 cm) Weight: 148 lb 2.4 oz (67.2 kg) IBW/kg (Calculated) : 82.2 Heparin Dosing Weight: 67 kg  Vital Signs: Temp: 97.9 F (36.6 C) (07/06 1150) Temp Source: Oral (07/06 1150) BP: 120/88 mmHg (07/06 1148) Pulse Rate: 111 (07/06 1148)  Labs:  Recent Labs  10/27/15 0757 10/27/15 0803 10/27/15 1124  HGB 12.0* 13.6 11.5*  HCT 38.0* 40.0 35.2*  PLT 224  --  163  APTT 26  --  27  LABPROT 14.4  --  15.3*  INR 1.10  --  1.20  CREATININE 1.90* 1.50* 1.61*  TROPONINI 1.36*  --  20.31*    Estimated Creatinine Clearance: 41.2 mL/min (by C-G formula based on Cr of 1.61).   Medical History: Past Medical History  Diagnosis Date  . Hypertension   . Chronic combined systolic and diastolic heart failure ([place])   . Peripheral neuropathy ([place])   . Gout   . Hx of echocardiogram 2015    Echo (08/2013):  EF 10-15%, diff HK, mod LAE, severe RVE, mod reduced RVSF, mild RAE, PASP 41 mmHg.  . Ischemic cardiomyopathy     Echo (8/11):  EF 40-45%;  Echo (5/15):  EF 10-15%  . CAD (coronary artery disease)     a. LHC (08/2005):  Ostial LAD 99%, mid LAD 50%, superior D1 80%, inferior D1 75%, mid OM proximal 80%, proximal PDA 25-30%, mid RCA 99%.   MRI with full thickness scar.  Med Rx.  2015 demonstrated similar diffuse three-vessel coronary disease. Perfusion imaging with rest we distribution demonstrated no viability in the area of the LAD and circumflex. Medical management.  Marland Kitchen NSVT (nonsustained ventricular tachycardia) (HCC)     s/p ICD  . CKD (chronic kidney disease)   . Automatic implantable cardioverter-defibrillator in situ   . Myocardial infarction [facility]) 2007    out of hosp [place] - LHC with 3v CAD rx medically  . Sleep apnea     "has CPAP; won't use it"  . Stroke [facility]) ~ 2013    "right leg weak since" (12/09/2013)  . Type II  diabetes mellitus ([place]) dx'd 2005  . CAD, multiple vessel, RCA 100% mid occl.; LAD 99% stenosisprox.  mid of 50-60%; LCX OM-1 90%, OM-2 99%,AV groove 80%  12/10/2013    Medications:  Prescriptions prior to admission  Medication Sig Dispense Refill Last Dose  . digoxin (LANOXIN) 0.125 MG tablet Take 0.125 mg by mouth daily.   unk  . hydrALAZINE (APRESOLINE) 25 MG tablet Take 25 mg by mouth 3 (three) times daily.   unk  . mirtazapine (REMERON) 15 MG tablet Take 1 tablet (15 mg total) by mouth at bedtime. 30 tablet 0 unk  . torsemide (DEMADEX) 20 MG tablet Take 2 tablets (40 mg total) by mouth daily. 60 tablet 6 unk  . aspirin EC 81 MG tablet Take 81 mg by mouth daily.   Taking  . hydrALAZINE (APRESOLINE) 10 MG tablet Take 10 mg by mouth 2 (two) times daily.   dose change  . insulin aspart (NOVOLOG FLEXPEN) 100 UNIT/ML FlexPen Inject 5-15 Units into the skin 3 (three) times daily with meals.   unk  . insulin glargine (LANTUS) 100 UNIT/ML injection Inject 20 Units into the skin at bedtime.   unk  . Insulin Pen Needle (BD PEN NEEDLE NANO U/F) 32G X 4 MM MISC Test once daily dx  e10.9 200 each 3 Taking  . isosorbide mononitrate (IMDUR) 30 MG 24 hr tablet Take 1 tablet (30 mg total) by mouth daily. 30 tablet 6 unk  . ivabradine (CORLANOR) 5 MG TABS tablet Take 1 tablet (5 mg total) by mouth 2 (two) times daily with a meal. 60 tablet 6 unk  . Magnesium 250 MG TABS Take one tablet by mouth every other day  0 unk  . Multiple Vitamins-Minerals (MULTIVITAMIN WITH MINERALS) tablet Take 1 tablet by mouth daily.   unk  . rosuvastatin (CRESTOR) 40 MG tablet TAKE 1 TABLET BY MOUTH DAILY 90 tablet 0 unk  . spironolactone (ALDACTONE) 25 MG tablet Take 0.5 tablets (12.5 mg total) by mouth daily. 15 tablet 6 unk  . tamsulosin (FLOMAX) 0.4 MG CAPS capsule Take 1 capsule (0.4 mg total) by mouth daily. 30 capsule 6 unk   Scheduled:  . ampicillin-sulbactam (UNASYN) IV  1.5 g Intravenous Q6H  . aspirin EC  325 mg Oral  Daily  . fentaNYL (SUBLIMAZE) injection  100 mcg Intravenous Once  . insulin aspart  0-9 Units Subcutaneous Q4H  . insulin aspart  10 Units Intravenous Once  . midazolam  4 mg Intravenous Once  . sodium chloride  1,000 mL Intravenous Once  . sodium chloride  500 mL Intravenous Once   Infusions:  . sodium chloride 100 mL/hr at 10/27/15 1142  . amiodarone 30 mg/hr (10/27/15 1141)    Assessment: 70yo male presents with AMS. Pharmacy is consulted to dose heparin for ACS/chest pain.   Goal of Therapy:  Heparin level 0.3-0.7 units/ml Monitor platelets by anticoagulation protocol: Yes   Plan:  Give 4000 units bolus x 1 Start heparin infusion at 800 units/hr Check anti-Xa level in 6 hours and daily while on heparin Continue to monitor H&H and platelets  Andrey Cota. [name], PharmD, [place] Clinical Pharmacist Pager [phone] 10/27/2015,1:43 PM

## 2015-10-27 NOTE — Progress Notes (Signed)
ANTICOAGULATION CONSULT NOTE Pharmacy Consult for Heparin Indication: chest pain/ACS  No Known Allergies  Patient Measurements: Height: _0  (188 cm) Weight: 148 lb 2.4 oz (67.2 kg) IBW/kg (Calculated) : 82.2 Heparin Dosing Weight: 67 kg  Vital Signs: Temp: 98.6 F (37 C) (07/06 2008) Temp Source: Oral (07/06 2008) BP: 105/71 mmHg (07/06 2100) Pulse Rate: 82 (07/06 2100)  Labs:  Recent Labs  10/27/15 0757 10/27/15 0803 10/27/15 1124 10/27/15 1530 10/27/15 2047  HGB 12.0* 13.6 11.5*  --   --   HCT 38.0* 40.0 35.2*  --   --   PLT 224  --  163  --   --   APTT 26  --  27  --   --   LABPROT 14.4  --  15.3*  --   --   INR 1.10  --  1.20  --   --   HEPARINUNFRC  --   --   --   --  0.23*  CREATININE 1.90* 1.50* 1.61*  --   --   TROPONINI 1.36*  --  20.31* >65.00*  --     Estimated Creatinine Clearance: 41.2 mL/min (by C-G formula based on Cr of 1.61).   Medical History: Past Medical History  Diagnosis Date  . Hypertension   . Chronic combined systolic and diastolic heart failure ([place])   . Peripheral neuropathy ([place])   . Gout   . Hx of echocardiogram 2015    Echo (08/2013):  EF 10-15%, diff HK, mod LAE, severe RVE, mod reduced RVSF, mild RAE, PASP 41 mmHg.  . Ischemic cardiomyopathy     Echo (8/11):  EF 40-45%;  Echo (5/15):  EF 10-15%  . CAD (coronary artery disease)     a. LHC (08/2005):  Ostial LAD 99%, mid LAD 50%, superior D1 80%, inferior D1 75%, mid OM proximal 80%, proximal PDA 25-30%, mid RCA 99%.   MRI with full thickness scar.  Med Rx.  2015 demonstrated similar diffuse three-vessel coronary disease. Perfusion imaging with rest we distribution demonstrated no viability in the area of the LAD and circumflex. Medical management.  Marland Kitchen NSVT (nonsustained ventricular tachycardia) (HCC)     s/p ICD  . CKD (chronic kidney disease)   . Automatic implantable cardioverter-defibrillator in situ   . Myocardial infarction [facility]) 2007    out of hosp [place] - LHC with 3v CAD rx  medically  . Sleep apnea     "has CPAP; won't use it"  . Stroke [facility]) ~ 2013    "right leg weak since" (12/09/2013)  . Type II diabetes mellitus ([place]) dx'd 2005  . CAD, multiple vessel, RCA 100% mid occl.; LAD 99% stenosisprox.  mid of 50-60%; LCX OM-1 90%, OM-2 99%,AV groove 80%  12/10/2013    Medications:  Prescriptions prior to admission  Medication Sig Dispense Refill Last Dose  . digoxin (LANOXIN) 0.125 MG tablet Take 0.125 mg by mouth daily.   unk  . hydrALAZINE (APRESOLINE) 25 MG tablet Take 25 mg by mouth 3 (three) times daily.   unk  . mirtazapine (REMERON) 15 MG tablet Take 1 tablet (15 mg total) by mouth at bedtime. 30 tablet 0 unk  . torsemide (DEMADEX) 20 MG tablet Take 2 tablets (40 mg total) by mouth daily. 60 tablet 6 unk  . aspirin EC 81 MG tablet Take 81 mg by mouth daily.   Taking  . hydrALAZINE (APRESOLINE) 10 MG tablet Take 10 mg by mouth 2 (two) times daily.   dose change  .  insulin aspart (NOVOLOG FLEXPEN) 100 UNIT/ML FlexPen Inject 5-15 Units into the skin 3 (three) times daily with meals.   unk  . insulin glargine (LANTUS) 100 UNIT/ML injection Inject 20 Units into the skin at bedtime.   unk  . Insulin Pen Needle (BD PEN NEEDLE NANO U/F) 32G X 4 MM MISC Test once daily dx e10.9 200 each 3 Taking  . isosorbide mononitrate (IMDUR) 30 MG 24 hr tablet Take 1 tablet (30 mg total) by mouth daily. 30 tablet 6 unk  . ivabradine (CORLANOR) 5 MG TABS tablet Take 1 tablet (5 mg total) by mouth 2 (two) times daily with a meal. 60 tablet 6 unk  . Magnesium 250 MG TABS Take one tablet by mouth every other day  0 unk  . Multiple Vitamins-Minerals (MULTIVITAMIN WITH MINERALS) tablet Take 1 tablet by mouth daily.   unk  . rosuvastatin (CRESTOR) 40 MG tablet TAKE 1 TABLET BY MOUTH DAILY 90 tablet 0 unk  . spironolactone (ALDACTONE) 25 MG tablet Take 0.5 tablets (12.5 mg total) by mouth daily. 15 tablet 6 unk  . tamsulosin (FLOMAX) 0.4 MG CAPS capsule Take 1 capsule (0.4 mg total) by  mouth daily. 30 capsule 6 unk   Scheduled:  . ampicillin-sulbactam (UNASYN) IV  1.5 g Intravenous Q6H  . antiseptic oral rinse  7 mL Mouth Rinse QID  . aspirin  325 mg Per Tube Daily  . chlorhexidine gluconate (SAGE KIT)  15 mL Mouth Rinse BID  . famotidine (PEPCID) IV  20 mg Intravenous Q24H  . insulin aspart  0-9 Units Subcutaneous Q4H  . midazolam  4 mg Intravenous Once  . sodium chloride  1,000 mL Intravenous Once   Infusions:  . sodium chloride 100 mL/hr at 10/27/15 1142  . amiodarone 30 mg/hr (10/27/15 1956)  . heparin 800 Units/hr (10/27/15 1511)    Assessment: 70yo male presents with AMS. Pharmacy is consulted to dose heparin for ACS/chest pain.   Initial heparin level = 0.23  Goal of Therapy:  Heparin level 0.3-0.7 units/ml Monitor platelets by anticoagulation protocol: Yes   Plan:  Heparin to 1050 units / hr Follow up AM labs  Thank you Anette Guarneri, PharmD [phone] 10/27/2015,9:21 PM

## 2015-10-27 NOTE — Progress Notes (Signed)
Stat EEG performed at  Bedside.  Results pending.

## 2015-10-27 NOTE — ED Provider Notes (Signed)
Pt with run of Vtach that was terminated by ICD then he had right UE jerking Cardiology has been consulted Critical care here to evaluate patient   Ripley Fraise, MD 10/27/15 [phone]

## 2015-10-27 NOTE — ED Notes (Signed)
All blood cultures were drawn PRIOR TO starting abx.

## 2015-10-27 NOTE — Procedures (Signed)
Central Venous Catheter Insertion Procedure Note KATRINA KRAUSER [id number] 1945-08-21  Procedure: Insertion of Central Venous Catheter Indications: Assessment of intravascular volume, Drug and/or fluid administration and Frequent blood sampling  Procedure Details Consent: Risks of procedure as well as the alternatives and risks of each were explained to the (patient/caregiver).  Consent for procedure obtained. Time Out: Verified patient identification, verified procedure, site/side was marked, verified correct patient position, special equipment/implants available, medications/allergies/relevent history reviewed, required imaging and test results available.  Performed  Maximum sterile technique was used including antiseptics, cap, gloves, gown, hand hygiene, mask and sheet. Skin prep: Chlorhexidine; local anesthetic administered A antimicrobial bonded/coated triple lumen catheter was placed in the left internal jugular vein using the Seldinger technique.  Evaluation Blood flow good Complications: No apparent complications Patient did tolerate procedure well. Chest X-ray ordered to verify placement.  CXR: pending.  Procedure performed under direct ultrasound guidance for real time vessel cannulation.      Montey Hora, [place] [facility] Pager: [phone]  or [phone] 10/27/2015, 2:20 PM

## 2015-10-27 NOTE — Procedures (Signed)
Electroencephalogram (EEG) Report  Date of study: 10/27/15  Requesting clinician: Etta Quill, PA  Reason for study: Seizure, r/o NCSE  Brief clinical history: 43-yo man with h/o stroke now with recurrent partial seizures.   Medications:  Current facility-administered medications:  .  0.9 %  sodium chloride infusion, , Intravenous, Continuous, Grace Bushy Minor, NP, Last Rate: 100 mL/hr at 10/27/15 1142 .  acetone (urine) test strip 1 strip, 1 strip, Does not apply, PRN, Grace Bushy Minor, NP .  amiodarone (NEXTERONE PREMIX) 360-4.14 MG/200ML-% (1.8 mg/mL) IV infusion, 30 mg/hr, Intravenous, Continuous, Amber Sena Slate, NP, Last Rate: 16.7 mL/hr at 10/27/15 1141, 30 mg/hr at 10/27/15 1141 .  ampicillin-sulbactam (UNASYN) 1.5 g in sodium chloride 0.9 % 50 mL IVPB, 1.5 g, Intravenous, Q6H, Meagan A Decker, RPH, 1.5 g at 10/27/15 1728 .  antiseptic oral rinse solution (CORINZ), 7 mL, Mouth Rinse, QID, Javier Glazier, MD, 7 mL at 10/27/15 1600 .  aspirin tablet 325 mg, 325 mg, Per Tube, Daily, Rahul P Desai, PA-C, 325 mg at 10/27/15 1516 .  chlorhexidine gluconate (SAGE KIT) (PERIDEX) 0.12 % solution 15 mL, 15 mL, Mouth Rinse, BID, Javier Glazier, MD .  famotidine (PEPCID) IVPB 20 mg premix, 20 mg, Intravenous, Q24H, Tanda Rockers, MD, 20 mg at 10/27/15 1647 .  fentaNYL (SUBLIMAZE) injection 50 mcg, 50 mcg, Intravenous, Q2H PRN, Grace Bushy Minor, NP, 50 mcg at 10/27/15 1701 .  heparin ADULT infusion 100 units/mL (25000 units/243m sodium chloride 0.45%), 800 Units/hr, Intravenous, Continuous, [facility] Last Rate: 8 mL/hr at 10/27/15 1511, 800 Units/hr at 10/27/15 1511 .  insulin aspart (novoLOG) injection 0-9 Units, 0-9 Units, Subcutaneous, Q4H, JJavier Glazier MD, 3 Units at 10/27/15 1647 .  midazolam (VERSED) injection 1 mg, 1 mg, Intravenous, Q15 min PRN, WGrace BushyMinor, NP, 1 mg at 10/27/15 1417 .  midazolam (VERSED) injection 1 mg, 1 mg, Intravenous, Q2H PRN, WGrace BushyMinor,  NP .  midazolam (VERSED) injection 4 mg, 4 mg, Intravenous, Once, DJuanito Doom MD, 4 mg at 10/27/15 1018 .  sodium chloride 0.9 % bolus 1,000 mL, 1,000 mL, Intravenous, Once, WGrace BushyMinor, NP, 1,000 mL at 10/27/15 1038  Description: This is a routine EEG performed using standard international 10-20 electrode placement. A total of 18 channels are recorded, including one for the EKG.  Activating Maneuvers: None performed.   Findings:  EKG channel demonstrates a regular rhythm with a rate of 90 beats per minute. He is intubated.   This study demonstrates severe diffuse generalized slowing consisting of low voltage delta. This is symmetric. There are no epileptiform discharges. No seizures are recorded.   Impression: This is a markedly abnormal EEG due to severe diffuse generalized slowing. This is consistent with a severe encephalopathic process but non-specific as to its cause. No evidence of seizure activity is seen.    TMelba Coon MD [facility]

## 2015-10-27 NOTE — ED Notes (Signed)
Critical Care at bedside.  

## 2015-10-27 NOTE — Progress Notes (Signed)
[facility] P[facility]M  Notified of troponin elevation, now in shock, onoging hyperglycemia, hypoglycemia  Plan Asa Heparin gtt Insulin regular now Notify cardiology Bolus saline CVL  Roselie Awkward, MD [facility] Pager: [phone] Cell: [phone] After 3pm or if no response, call [phone]

## 2015-10-27 NOTE — Procedures (Signed)
Intubation Procedure Note [name] [name] [id number] 1945-06-17  Procedure: Intubation Indications: Airway protection and maintenance  Procedure Details Consent: Risks of procedure as well as the alternatives and risks of each were explained to the (patient/caregiver).  Consent for procedure obtained. Time Out: Verified patient identification, verified procedure, site/side was marked, verified correct patient position, special equipment/implants available, medications/allergies/relevent history reviewed, required imaging and test results available.  Performed  Maximum sterile technique was used including gloves, gown, hand hygiene and mask.  MAC and 3    Evaluation Hemodynamic Status: BP stable throughout; O2 sats: stable throughout Patient's Current Condition: stable Complications: No apparent complications Patient did tolerate procedure well. Chest X-ray ordered to verify placement.  CXR: pending.  Pt intubated with 7.5 ETT positioned and secured with commercial tube holder at center position. Bilateral breath sounds noted. Positive color change in capnometer. Symmetrical chest movement. CXR ordered. ABG to be performed in 1 hr. Curt Jews 10/27/2015

## 2015-10-27 NOTE — Sedation Documentation (Signed)
Pt intubated by Dr. Lake Bells.

## 2015-10-27 NOTE — Progress Notes (Signed)
Critical troponin 20.31 Dr Lake Bells and Dr Haroldine Laws notified. Bensimhon ok with starting Heparin drip and administering aspirin. No additional orders from cardiology. 500cc Bolus administered for current BP 78/62 MAP 69. Nursing to continue to monitor patient.

## 2015-10-27 NOTE — Procedures (Signed)
Intubation Procedure Note BILLYRAY GRUDZIEN [id number] 04/03/1946  Procedure: Intubation Indications: Respiratory insufficiency  Procedure Details Consent: Risks of procedure as well as the alternatives and risks of each were explained to the (patient/caregiver).  Consent for procedure obtained. Time Out: Verified patient identification, verified procedure, site/side was marked, verified correct patient position, special equipment/implants available, medications/allergies/relevent history reviewed, required imaging and test results available.  Performed  Drugs Etomidate 20mg , Rocuronium 65mg , Versed 1mg , Fentanyl 24mcg DL x 1 with GS3 blade Grade 1 view 7.5 ET tube passed through cords under direct visualization Placement confirmed with bilateral breath sounds, positive EtCO2 change and smoke in tube   Evaluation Hemodynamic Status: BP stable throughout; O2 sats: stable throughout Patient's Current Condition: stable Complications: No apparent complications Patient did tolerate procedure well. Chest X-ray ordered to verify placement.  CXR: pending.   Simonne Maffucci 10/27/2015
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
## 2015-10-27 NOTE — ED Notes (Signed)
Family aware of plan to intubate pt and verbalized consent to inbtubate, no questions or concerns.

## 2015-10-27 NOTE — Progress Notes (Addendum)
Pharmacy Antibiotic Note  [name] [name] is a 70 y.o. male admitted on 10/27/2015 with sepsis and concern for aspiration. Pharmacy initially consulted for vancomycin and Zosyn dosing, now changing to Unasyn.  Plan: --Vancomycin 1000 mg IV x 1 given, d/c maintenance doses --Zosyn 3.375g IV x 1 given, d/c maintenance doses --Unasyn 1.5 g q6h starting 8h after Zosyn dose --F/U clinical improvement, cultures, de-escalation plans, LOT   Pharmacy Code Sepsis Protocol  Time of code sepsis page: 0817 [x]  Antibiotics delivered at 0815  Were antibiotics ordered at the time of the code sepsis page? Yes Was it required to contact the physician? NO  Pharmacy consulted for: vancomycin and Zosyn  Anti-infectives    Start     Dose/Rate Route Frequency Ordered Stop   10/27/15 2100  vancomycin (VANCOCIN) 500 mg in sodium chloride 0.9 % 100 mL IVPB  Status:  Discontinued     500 mg 100 mL/hr over 60 Minutes Intravenous Every 12 hours 10/27/15 0834 10/27/15 1033   10/27/15 1700  piperacillin-tazobactam (ZOSYN) IVPB 3.375 g  Status:  Discontinued     3.375 g 12.5 mL/hr over 240 Minutes Intravenous Every 8 hours 10/27/15 0834 10/27/15 1033   10/27/15 0815  piperacillin-tazobactam (ZOSYN) IVPB 3.375 g     3.375 g 100 mL/hr over 30 Minutes Intravenous  Once 10/27/15 0812 10/27/15 0920   10/27/15 0815  vancomycin (VANCOCIN) IVPB 1000 mg/200 mL premix     1,000 mg 200 mL/hr over 60 Minutes Intravenous  Once 10/27/15 0812 10/27/15 1000     Height: 6\' 2"  (188 cm) Weight: 142 lb (64.411 kg) IBW/kg (Calculated) : 82.2  Temp (24hrs), Avg:98.8 F (37.1 C), Min:98.3 F (36.8 C), Max:99.3 F (37.4 C)   Recent Labs Lab 10/20/15 1451 10/27/15 0757 10/27/15 0803  WBC  --  18.3*  --   CREATININE 2.20* 1.90* 1.50*  LATICACIDVEN  --   --  6.11*    Estimated Creatinine Clearance: 42.3 mL/min (by C-G formula based on Cr of 1.5).    No Known Allergies  Antimicrobials this admission: 7/6 Unasyn  >> 7/6 vancomycin x1 7/6 Zosyn x1  Dose adjustments this admission: n/a  Microbiology results: 7/6 BCx x2 >>  Thank you for allowing pharmacy to be a part of this patient's care.  Governor Specking, PharmD Clinical Pharmacist Phone: [phone]  10/27/2015 10:43 AM

## 2015-10-27 NOTE — H&P (Signed)
PULMONARY / CRITICAL CARE MEDICINE   Name: [name] [name] MRN: [id number] DOB: 07-16-1945    ADMISSION DATE:  10/27/2015   REFERRING MD:  EDP  CHIEF COMPLAINT:  AMS  HISTORY OF PRESENT ILLNESS:   70 yo M with plethora of health issues, EF 15%, IACD, CVA rt side weakness 2015, IDDM who presents to [facility] ED with 24 hours of increased confusion, rt arm tremor, agitation. He vomited and most likely aspirated 7/6 and will require intubation for airway protection. Neuro and Cards will see patient, note he had VT in ER and IACD fired. We will admit to ICU, treat with abx,do nuero and cardiology work ups. He is a full code per wife and family.  PAST MEDICAL HISTORY :  He  has a past medical history of Hypertension; Chronic combined systolic and diastolic heart failure ([place]); Peripheral neuropathy ([place]); Gout; echocardiogram (2015); Ischemic cardiomyopathy; CAD (coronary artery disease); NSVT (nonsustained ventricular tachycardia) ([place]); CKD (chronic kidney disease); Automatic implantable cardioverter-defibrillator in situ; Myocardial infarction [facility]) (2007); Sleep apnea; Stroke ([place]) (~ 2013); Type II diabetes mellitus ([place]) (dx'd 2005); and CAD, multiple vessel, RCA 100% mid occl.; LAD 99% stenosisprox.  mid of 50-60%; LCX OM-1 90%, OM-2 99%,AV groove 80%  (12/10/2013).  PAST SURGICAL HISTORY: He  has past surgical history that includes Laceration repair (1980's); Laceration repair (1970's); Cardiac catheterization (2007); Cardiac catheterization (12/09/2013); Cardiac defibrillator placement (2007; ?10/2012); Knee arthroscopy (Right, 1980's); implantable cardioverter defibrillator (icd) generator change (N/A, 10/22/2012); left and right heart catheterization with coronary angiogram (N/A, 12/09/2013); and Cardiac catheterization (N/A, 08/01/2015).  No Known Allergies  No current facility-administered medications on file prior to encounter.   Current Outpatient Prescriptions on File Prior to Encounter   Medication Sig  . digoxin (LANOXIN) 0.125 MG tablet Take 0.125 mg by mouth daily.  . mirtazapine (REMERON) 15 MG tablet Take 1 tablet (15 mg total) by mouth at bedtime.  . torsemide (DEMADEX) 20 MG tablet Take 2 tablets (40 mg total) by mouth daily.  Marland Kitchen aspirin EC 81 MG tablet Take 81 mg by mouth daily.  . hydrALAZINE (APRESOLINE) 10 MG tablet Take 10 mg by mouth 2 (two) times daily.  . insulin aspart (NOVOLOG FLEXPEN) 100 UNIT/ML FlexPen Inject 5-15 Units into the skin 3 (three) times daily with meals.  . insulin glargine (LANTUS) 100 UNIT/ML injection Inject 20 Units into the skin at bedtime.  . Insulin Pen Needle (BD PEN NEEDLE NANO U/F) 32G X 4 MM MISC Test once daily dx e10.9  . isosorbide mononitrate (IMDUR) 30 MG 24 hr tablet Take 1 tablet (30 mg total) by mouth daily.  . ivabradine (CORLANOR) 5 MG TABS tablet Take 1 tablet (5 mg total) by mouth 2 (two) times daily with a meal.  . Magnesium 250 MG TABS Take one tablet by mouth every other day  . Multiple Vitamins-Minerals (MULTIVITAMIN WITH MINERALS) tablet Take 1 tablet by mouth daily.  . rosuvastatin (CRESTOR) 40 MG tablet TAKE 1 TABLET BY MOUTH DAILY  . spironolactone (ALDACTONE) 25 MG tablet Take 0.5 tablets (12.5 mg total) by mouth daily.  . tamsulosin (FLOMAX) 0.4 MG CAPS capsule Take 1 capsule (0.4 mg total) by mouth daily.    FAMILY HISTORY:  His indicated that his mother is deceased. He indicated that his father is deceased.   SOCIAL HISTORY: He  reports that he has never smoked. He has never used smokeless tobacco. He reports that he does not drink alcohol or use illicit drugs.  REVIEW OF SYSTEMS:  NA  SUBJECTIVE:  Confused  VITAL SIGNS: BP 114/84 mmHg  Pulse 116  Temp(Src) 99.3 F (37.4 C) (Rectal)  Resp 20  Ht 6\' 2"  (1.88 m)  Wt 142 lb (64.411 kg)  BMI 18.22 kg/m2  SpO2 100%  HEMODYNAMICS:    VENTILATOR SETTINGS:    INTAKE / OUTPUT:    PHYSICAL EXAMINATION: General:  Frail wasted , confused  wm Neuro:   No follows commands, rt arm tremors ?partial sz activity, rt side weakness.  HEENT:  PERL 4 mm, no focus or follows. + doll's eyes Cardiovascular:  HSR IR +murmur Lungs:  cta Abdomen:  SOFT + BS Musculoskeletal:  Intact Skin:  Bilateral lower ext ischemic changes No edema  LABS:  BMET  Recent Labs Lab 10/20/15 1451 10/27/15 0757 10/27/15 0803  NA 136 137 140  K 4.4 4.9 5.0  CL 99* 101 102  CO2 25 23  --   BUN 36* 27* 33*  CREATININE 2.20* 1.90* 1.50*  GLUCOSE 487* 552* 548*    Electrolytes  Recent Labs Lab 10/20/15 1451 10/27/15 0757  CALCIUM 9.1 9.1    CBC  Recent Labs Lab 10/27/15 0757 10/27/15 0803  WBC 18.3*  --   HGB 12.0* 13.6  HCT 38.0* 40.0  PLT 224  --     Coag's  Recent Labs Lab 10/27/15 0757  APTT 26  INR 1.10    Sepsis Markers  Recent Labs Lab 10/27/15 0803  LATICACIDVEN 6.11*    ABG  Recent Labs Lab 10/27/15 0853  PHART 7.425  PCO2ART 37.3  PO2ART 68.0*    Liver Enzymes  Recent Labs Lab 10/27/15 0757  AST 35  ALT 27  ALKPHOS 142*  BILITOT 0.8  ALBUMIN 3.2*    Cardiac Enzymes  Recent Labs Lab 10/27/15 0757  TROPONINI 1.36*    Glucose No results for input(s): GLUCAP in the last 168 hours.  Imaging Ct Head Wo Contrast  10/27/2015  CLINICAL DATA:  Confusion.  Altered mental status. EXAM: CT HEAD WITHOUT CONTRAST TECHNIQUE: Contiguous axial images were obtained from the base of the skull through the vertex without intravenous contrast. COMPARISON:  CT head 04/10/2015 FINDINGS: Moderate atrophy is unchanged. Ventricular enlargement consistent with atrophy. Mild chronic white matter hypodensity bilaterally is stable Negative for acute infarct. Negative for acute hemorrhage or mass. No shift of the midline structures. Negative calvarium. Gas in the soft tissues round skullbase likely is related to IV placement. IMPRESSION: Atrophy with chronic microvascular ischemia.  No acute abnormality.  Electronically Signed   By: Franchot Gallo M.D.   On: 10/27/2015 08:50   Dg Chest Portable 1 View  10/27/2015  CLINICAL DATA:  Possible aspiration, nor is the breath sounds, tachycardia; history of CVA, coronary artery disease, and diabetes,; current mental status change. EXAM: PORTABLE CHEST 1 VIEW COMPARISON:  PA and lateral chest x-ray of July 27, 2015 FINDINGS: There is confluent alveolar opacity in the right upper lobe. The interstitial markings in the mid and lower right lung are coarse. There is subtle increased density approximately 2 cm above the dome of the hemidiaphragm that is new. The heart is top-normal in size. The pulmonary vascularity is not engorged. There is a permanent pacemaker defibrillator in stable position. The observed bony thorax exhibits no acute abnormality. IMPRESSION: Right upper lobe and left lower lobe pneumonia. Low-grade compensated CHF. Followup PA and lateral chest X-ray is recommended in 3-4 weeks following trial of antibiotic therapy to ensure resolution and exclude underlying malignancy. Electronically Signed  By: David  [name] M.D.   On: 10/27/2015 08:21     STUDIES:  7/6 eeg>> 7/6 ct head>>   CULTURES: 7/6 uc>> 7/6 bc>> 7/6 sputum>>  ANTIBIOTICS: 7/6 unasyn>>  SIGNIFICANT EVENTS: 7/6 vt>>  LINES/TUBES: 7/6 OTT>>  DISCUSSION: 70 yo M with plethora of health issues, EF 15%, IACD, CVA rt side weakness 2015, IDDM who presents to [facility] ED with 24 hours of increased confusion, rt arm tremor, agitation. He vomited and most likely aspirated 7/6 and will require intubation for airway protection. Neuro and Cards will see patient, note he had VT in ER and IACD fired. We will admit to ICU, treat with abx,do nuero and cardiology work ups. He is a full code per wife and family.  ASSESSMENT / PLAN:  PULMONARY A: Aspiration pna Airway protection in setting of AMS/SZs P:   Intubation for airway protection Abx for asp pna Follow c x r and  abg  CARDIOVASCULAR A:  Heart failure with EF 15% IACD in place VT noted on admit with discharge IACD on admit P:  Correct lytes Cards consult May need amio drip Interrogate IACD per cards   RENAL A:   CRI base creatine 1.9 P:   Follow creatine  GASTROINTESTINAL A:   GI protection  P:   PPI  HEMATOLOGIC A:   No acute issues P:  DVT protection  INFECTIOUS A:   Presumed aspiration pna ? UTI P:   Unasyn  Cultures see ID  ENDOCRINE A:   IDDM with hyperglycemic event P:   SSI Check ketones Possible DKA protocol  NEUROLOGIC A:   Hx of CVA 2015 with mild rt side weakness and slurred speech. New onset 7/5 worse slurred speech, rt side weakness, rt arm tremors, decreased rt side movement P:   RASS goal: -1 when intubated Sedation for tube tolerance Neuro Consult 7/6 Will need MRI most likely EEG   FAMILY  - Updates:  Wife and daughters updated at bedside 7/6 per S. Minor ACNP and B. MqQuaid with information that he will need intubation and ICU care.  - Inter-disciplinary family meet or Palliative Care meeting due by:  July 13th    Pulmonary and [place] Pager: [phone]  10/27/2015, 9:49 AM

## 2015-10-27 NOTE — ED Notes (Addendum)
Pt family at bedside. Updated on pt status by Dr. Christy Gentles.

## 2015-10-27 NOTE — ED Notes (Signed)
Per EMS - pt from home. Pt wife called EMS after pt found in bed having vomited and unresponsive. Likely aspirated. Upon arrival pt nonverbal, moving left side only, agitated. Per family, pt had tremor in right arm last week w/ slurred speech and vision issues in right eye. Hx prev stroke w/ slurred speech deficit.   At 0800, pt given 1mg  ativan, pt less agitated but still attempting to climb out of stretcher. Given 1mg  ativan again and pt now resting more comfortably in stretcher. However, still attempting to pull at wires and move out of stretcher. At 0815 pt has restraint on left arm w/ verbal order from Dr. Christy Gentles.
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
## 2015-10-27 NOTE — ED Notes (Signed)
Neuro at bedside.

## 2015-10-27 NOTE — Progress Notes (Signed)
eLink Physician-Brief Progress Note Patient Name: [name] [name] DOB: [birth year] MRN: [id number]   Date of Service  10/27/2015  HPI/Events of Note  Pt needs GI sup on vent  eICU Interventions  Ordered pepcid     Intervention Category Intermediate Interventions: Best-practice therapies (e.g. DVT, beta blocker, etc.)  Christinia Gully 10/27/2015, 3:33 PM
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
## 2015-10-27 NOTE — Progress Notes (Signed)
Amiodarone was started at 30mg /hr by nurse error. Drip remains at 30mg /hr.

## 2015-10-27 NOTE — ED Notes (Signed)
Pt family at bedside

## 2015-10-27 NOTE — ED Notes (Signed)
Restraints removed after pt intubated.

## 2015-10-27 NOTE — Progress Notes (Signed)
Inpatient Diabetes Program Recommendations  AACE/ADA: New Consensus Statement on Inpatient Glycemic Control (2015)  Target Ranges:  Prepandial:   less than 140 mg/dL      Peak postprandial:   less than 180 mg/dL (1-2 hours)      Critically ill patients:  140 - 180 mg/dL   Lab Results  Component Value Date   GLUCAP 425* 10/27/2015   HGBA1C 9.1* 07/26/2015  Results for [name] [name], [name] [name] (MRN [id number]) as of 10/27/2015 14:24  Ref. Range 10/27/2015 07:57 10/27/2015 08:03 10/27/2015 11:24  Glucose Latest Ref Range: 65-99 mg/dL 552 (HH) 548 (HH) 457 (H)    Review of Glycemic Control  Diabetes history: DM2 Outpatient Diabetes medications: Lantus 20 units QHS, Novolog 5-15 units tidwc Current orders for Inpatient glycemic control: Novolog sensitive Q4H, Novolog 10 units (1x dose)  Needs ICU protocol.  Inpatient Diabetes Program Recommendations:   ICU Glycemic Control Protocol.  Thank you. Lorenda Peck, RD, LDN, CDE Inpatient Diabetes Coordinator [phone]

## 2015-10-28 ENCOUNTER — Other Ambulatory Visit (HOSPITAL_COMMUNITY): Payer: Commercial Managed Care - HMO

## 2015-10-28 ENCOUNTER — Other Ambulatory Visit: Payer: Self-pay

## 2015-10-28 ENCOUNTER — Inpatient Hospital Stay (HOSPITAL_COMMUNITY): Payer: Commercial Managed Care - HMO

## 2015-10-28 DIAGNOSIS — J9601 Acute respiratory failure with hypoxia: Secondary | ICD-10-CM

## 2015-10-28 LAB — BLOOD GAS, ARTERIAL
Acid-Base Excess: 0.3 mmol/L (ref 0.0–2.0)
Bicarbonate: 24.5 mEq/L — ABNORMAL HIGH (ref 20.0–24.0)
DRAWN BY: 365271
FIO2: 0.4
MECHVT: 500 mL
O2 Saturation: 99 %
PEEP/CPAP: 5 cmH2O
PO2 ART: 144 mmHg — AB (ref 80.0–100.0)
Patient temperature: 97.8
RATE: 18 resp/min
TCO2: 25.7 mmol/L (ref 0–100)
pCO2 arterial: 39.2 mmHg (ref 35.0–45.0)
pH, Arterial: 7.409 (ref 7.350–7.450)

## 2015-10-28 LAB — CBC
HEMATOCRIT: 31.7 % — AB (ref 39.0–52.0)
HEMOGLOBIN: 10.3 g/dL — AB (ref 13.0–17.0)
MCH: 28.8 pg (ref 26.0–34.0)
MCHC: 32.5 g/dL (ref 30.0–36.0)
MCV: 88.5 fL (ref 78.0–100.0)
Platelets: 151 10*3/uL (ref 150–400)
RBC: 3.58 MIL/uL — ABNORMAL LOW (ref 4.22–5.81)
RDW: 15.7 % — ABNORMAL HIGH (ref 11.5–15.5)
WBC: 12.2 10*3/uL — AB (ref 4.0–10.5)

## 2015-10-28 LAB — BASIC METABOLIC PANEL
ANION GAP: 4 — AB (ref 5–15)
BUN: 20 mg/dL (ref 6–20)
CALCIUM: 8 mg/dL — AB (ref 8.9–10.3)
CO2: 25 mmol/L (ref 22–32)
Chloride: 114 mmol/L — ABNORMAL HIGH (ref 101–111)
Creatinine, Ser: 1.31 mg/dL — ABNORMAL HIGH (ref 0.61–1.24)
GFR calc Af Amer: >60 mL/min (ref >60)
GFR, EST NON AFRICAN AMERICAN: 54 mL/min — AB (ref >60)
GLUCOSE: 129 mg/dL — AB (ref 65–99)
POTASSIUM: 3.9 mmol/L (ref 3.5–5.1)
Sodium: 143 mmol/L (ref 135–145)

## 2015-10-28 LAB — HEPARIN LEVEL (UNFRACTIONATED)
HEPARIN UNFRACTIONATED: 0.2 [IU]/mL — AB (ref 0.30–0.70)
HEPARIN UNFRACTIONATED: 0.3 [IU]/mL (ref 0.30–0.70)

## 2015-10-28 LAB — GLUCOSE, CAPILLARY
GLUCOSE-CAPILLARY: 437 mg/dL — AB (ref 65–99)
GLUCOSE-CAPILLARY: 128 mg/dL — AB (ref 65–99)
GLUCOSE-CAPILLARY: 157 mg/dL — AB (ref 65–99)
GLUCOSE-CAPILLARY: 166 mg/dL — AB (ref 65–99)
GLUCOSE-CAPILLARY: 134 mg/dL — AB (ref 65–99)
GLUCOSE-CAPILLARY: 195 mg/dL — AB (ref 65–99)

## 2015-10-28 LAB — MAGNESIUM
Magnesium: 1.8 mg/dL (ref 1.7–2.4)
Magnesium: 2.4 mg/dL (ref 1.7–2.4)

## 2015-10-28 LAB — HEMOGLOBIN A1C
Hgb A1c MFr Bld: 12 % — ABNORMAL HIGH (ref 4.8–5.6)
Mean Plasma Glucose: 298 mg/dL

## 2015-10-28 LAB — PHOSPHORUS
Phosphorus: 2.9 mg/dL (ref 2.5–4.6)
Phosphorus: 2.9 mg/dL (ref 2.5–4.6)

## 2015-10-28 MED ORDER — ROSUVASTATIN CALCIUM 20 MG PO TABS
40.0000 mg | ORAL_TABLET | Freq: Every day | ORAL | Status: DC
  Administered 2015-10-28 – 2015-11-05 (×9): 40 mg
  Filled 2015-10-28 (×3): qty 2
  Filled 2015-10-28 (×4): qty 1
  Filled 2015-10-28 (×2): qty 2

## 2015-10-28 MED ORDER — HEPARIN (PORCINE) IN NACL 100-0.45 UNIT/ML-% IJ SOLN
1700.0000 [IU]/h | INTRAMUSCULAR | Status: DC
  Administered 2015-10-29 – 2015-10-30 (×2): 1550 [IU]/h via INTRAVENOUS
  Filled 2015-10-28 (×9): qty 250

## 2015-10-28 MED ORDER — HEPARIN BOLUS VIA INFUSION
2000.0000 [IU] | Freq: Once | INTRAVENOUS | Status: AC
  Administered 2015-10-28: 2000 [IU] via INTRAVENOUS
  Filled 2015-10-28: qty 2000

## 2015-10-28 MED ORDER — VITAL AF 1.2 CAL PO LIQD
1000.0000 mL | ORAL | Status: DC
  Administered 2015-10-28 – 2015-10-29 (×2): 1000 mL
  Filled 2015-10-28: qty 1000

## 2015-10-28 MED ORDER — MAGNESIUM SULFATE 2 GM/50ML IV SOLN
2.0000 g | Freq: Once | INTRAVENOUS | Status: AC
  Administered 2015-10-28: 2 g via INTRAVENOUS
  Filled 2015-10-28: qty 50

## 2015-10-28 NOTE — Progress Notes (Signed)
Initial Nutrition Assessment  DOCUMENTATION CODES:   Severe malnutrition in context of chronic illness  INTERVENTION:    Initiate TF via OGT with Vital AF 1.2 at goal rate of 65 ml/h (1560 ml per day) to provide 1872 kcals, 117 gm protein, 1265 ml free water daily.  NUTRITION DIAGNOSIS:   Malnutrition related to chronic illness as evidenced by severe depletion of muscle mass, percent weight loss.  GOAL:   Patient will meet greater than or equal to 90% of their needs  MONITOR:   Vent status, Labs, Weight trends, TF tolerance, I & O's  REASON FOR ASSESSMENT:   Consult Enteral/tube feeding initiation and management  ASSESSMENT:   70 yo M with plethora of health issues, EF 15%, IACD, CVA rt side weakness 2015, IDDM who presents to [facility] ED with 24 hours of increased confusion, rt arm tremor, agitation. He vomited and most likely aspirated 7/6 and required intubation for airway protection.  Labs and medications reviewed. CBG's: [phone] Discussed patient in ICU rounds and with RN today. Received MD Consult for TF initiation and management. Nutrition-Focused physical exam completed. Findings are mild-moderate fat depletion, severe muscle depletion, and no edema. Patient with 8% weight loss over the past 3 months. Patient with severe PCM. Wife reports that he had lost from 167 lbs down to 141 lbs after recent hospitalization, but has gained up to 146 lbs now. He had been eating poorly for a few months, but recently has started eating a lot better. Patient is currently intubated on ventilator support MV: 9.7 L/min Temp (24hrs), Avg:98.9 F (37.2 C), Min:97.3 F (36.3 C), Max:100.8 F (38.2 C)   Diet Order:  Diet NPO time specified  Skin:  Wound (see comment) (stage 1 pressure injury to coccyx)  Last BM:  unknown  Height:   Ht Readings from Last 1 Encounters:  10/27/15 6\' 2"  (1.88 m)    Weight:   Wt Readings from Last 1 Encounters:  10/28/15 153 lb 10.6 oz  (69.7 kg)    Ideal Body Weight:  86.4 kg  BMI:  Body mass index is 19.72 kg/(m^2).  Estimated Nutritional Needs:   Kcal:  1944  Protein:  110-120 gm  Fluid:  2 L  EDUCATION NEEDS:   No education needs identified at this time  Molli Barrows, [place], [place], [place] Pager [phone] After Hours Pager [phone]

## 2015-10-28 NOTE — Progress Notes (Signed)
Inpatient Diabetes Program Recommendations  AACE/ADA: New Consensus Statement on Inpatient Glycemic Control (2015)  Target Ranges:  Prepandial:   less than 140 mg/dL      Peak postprandial:   less than 180 mg/dL (1-2 hours)      Critically ill patients:  140 - 180 mg/dL   Lab Results  Component Value Date   GLUCAP 166* 10/28/2015   HGBA1C 12.0* 10/27/2015   Results for [name] [name], [name] [name] (MRN [id number]) as of 10/28/2015 14:44  Ref. Range 10/27/2015 23:15 10/28/2015 03:44 10/28/2015 07:45 10/28/2015 11:20  Glucose-Capillary Latest Ref Range: 65-99 mg/dL 174 (H) 128 (H) 157 (H) 166 (H)   Review of Glycemic Control  Results for [name] [name], [name] [name] (MRN [id number]) as of 10/28/2015 14:44  Ref. Range 10/27/2015 11:24  Hemoglobin A1C Latest Ref Range: 4.8-5.6 % 12.0 (H)    Inpatient Diabetes Program Recommendations:   Needs ICU Glycemic Control Order Set for optimal glucose control.  Will continue to follow. Thank you. Lorenda Peck, RD, LDN, CDE Inpatient Diabetes Coordinator [phone]

## 2015-10-28 NOTE — Progress Notes (Addendum)
Advanced Heart Failure Rounding Note  Primary Physician: [name], MD Primary Cardiologist: [place] Electrophysiologist: Caryl Comes   Reason for Consultation: ICD shocks  Subjective:    Admitted to ICU 10/27/15 after being found down at home with ? CVA findings. Intubated for airway protection with vomiting.  Had VT PTA with HV therapy.   Remains intubated. Now responds to commands. Denies CP.   Creatinine stable to improved. Weight shows up 5 lbs despite negative I/O yesterday.   Objective:   Weight Range: 153 lb 10.6 oz (69.7 kg) Body mass index is 19.72 kg/(m^2).   Vital Signs:   Temp:  [97.3 F (36.3 C)-100.8 F (38.2 C)] 98.9 F (37.2 C) (07/07 0747) Pulse Rate:  [74-121] 85 (07/07 0750) Resp:  [14-24] 20 (07/07 0750) BP: (77-120)/(58-90) 112/71 mmHg (07/07 0750) SpO2:  [95 %-100 %] 100 % (07/07 0750) FiO2 (%):  [40 %-100 %] 40 % (07/07 0750) Weight:  [148 lb 2.4 oz (67.2 kg)-153 lb 10.6 oz (69.7 kg)] 153 lb 10.6 oz (69.7 kg) (07/07 0500)    Weight change: Filed Weights   10/27/15 0909 10/27/15 1200 10/28/15 0500  Weight: 142 lb (64.411 kg) 148 lb 2.4 oz (67.2 kg) 153 lb 10.6 oz (69.7 kg)    Intake/Output:   Intake/Output Summary (Last 24 hours) at 10/28/15 0943 Last data filed at 10/28/15 0600  Gross per 24 hour  Intake 2456.72 ml  Output   2505 ml  Net -48.28 ml     Physical Exam: General:  Intubated, responds to commands HEENT: Intubated Neck: supple. JVP 7-8. Carotids 2+ bilat; no bruits. No thyromegaly or nodule noted. Cor: PMI nondisplaced. Regular rate & rhythm. Regular rate and rhythm, 2/6 systolic murmur  Lungs: Mechanical breath sounds.  Abdomen: soft, NT, ND, no HSM. No bruits or masses. +BS  Extremities: no cyanosis, clubbing, rash, edema. Bilateral LEs cold below ankles.  Faint tibialis anterior Left, No DP pulse appreciated either side.  Neuro: alert & orientedx3, cranial nerves grossly intact. moves all 4 extremities w/o difficulty.  Affect pleasant  Telemetry:  Reviewed personally, NSR 70-80s, occasional 4-5 beat runs NSVT  Labs: CBC  Recent Labs  10/27/15 0757  10/27/15 1124 10/28/15 0420  WBC 18.3*  --  15.9* 12.2*  NEUTROABS 16.6*  --   --   --   HGB 12.0*  < > 11.5* 10.3*  HCT 38.0*  < > 35.2* 31.7*  MCV 88.2  --  87.3 88.5  PLT 224  --  163 151  < > = values in this interval not displayed. Basic Metabolic Panel  Recent Labs  10/27/15 1124 10/28/15 0420  NA 139 143  K 5.6* 3.9  CL 106 114*  CO2 24 25  GLUCOSE 457* 129*  BUN 25* 20  CREATININE 1.61* 1.31*  CALCIUM 8.2* 8.0*  MG 1.8  --   PHOS 3.1  --    Liver Function Tests  Recent Labs  10/27/15 0757 10/27/15 1124  AST 35 60*  ALT 27 27  ALKPHOS 142* 125  BILITOT 0.8 1.3*  PROT 7.2 6.3*  ALBUMIN 3.2* 2.8*   No results for input(s): LIPASE, AMYLASE in the last 72 hours. Cardiac Enzymes  Recent Labs  10/27/15 1124 10/27/15 1530 10/27/15 2047  TROPONINI 20.31* >65.00* >65.00*    BNP: BNP (last 3 results)  Recent Labs  04/11/15 0400 07/26/15 1707 10/27/15 1124  BNP 2568.2* 2061.3* 1318.0*    ProBNP (last 3 results) No results for input(s): PROBNP in the  last 8760 hours.   D-Dimer No results for input(s): DDIMER in the last 72 hours. Hemoglobin A1C  Recent Labs  10/27/15 1124  HGBA1C 12.0*   Fasting Lipid Panel No results for input(s): CHOL, HDL, LDLCALC, TRIG, CHOLHDL, LDLDIRECT in the last 72 hours. Thyroid Function Tests No results for input(s): TSH, T4TOTAL, T3FREE, THYROIDAB in the last 72 hours.  Invalid input(s): FREET3  Other results:     Imaging/Studies:  Ct Head Wo Contrast  10/27/2015  CLINICAL DATA:  Confusion.  Altered mental status. EXAM: CT HEAD WITHOUT CONTRAST TECHNIQUE: Contiguous axial images were obtained from the base of the skull through the vertex without intravenous contrast. COMPARISON:  CT head 04/10/2015 FINDINGS: Moderate atrophy is unchanged. Ventricular enlargement  consistent with atrophy. Mild chronic white matter hypodensity bilaterally is stable Negative for acute infarct. Negative for acute hemorrhage or mass. No shift of the midline structures. Negative calvarium. Gas in the soft tissues round skullbase likely is related to IV placement. IMPRESSION: Atrophy with chronic microvascular ischemia.  No acute abnormality. Electronically Signed   By: Franchot Gallo M.D.   On: 10/27/2015 08:50   Dg Chest Port 1 View  10/27/2015  CLINICAL DATA:  Central line placement EXAM: PORTABLE CHEST 1 VIEW COMPARISON:  10/27/2015 FINDINGS: Cardiomediastinal silhouette is stable. Worsening patchy pneumonia right upper lobe and right lower lobe. There is endotracheal tube in place with tip 5 cm above the carina. NG tube is noted with tip in proximal stomach just below the GE junction level. The NG tube should be advanced further into the stomach. There is left IJ central line with tip in upper SVC. No pneumothorax. IMPRESSION: Worsening patchy pneumonia right upper lobe and right lower lobe. There is endotracheal tube in place with tip 5 cm above the carina. NG tube is noted with tip in proximal stomach just below the GE junction level. The NG tube should be advanced further into the stomach. There is left IJ central line with tip in upper SVC. No pneumothorax. Electronically Signed   By: Lahoma Crocker M.D.   On: 10/27/2015 14:54   Dg Chest Portable 1 View  10/27/2015  CLINICAL DATA:  Possible aspiration, nor is the breath sounds, tachycardia; history of CVA, coronary artery disease, and diabetes,; current mental status change. EXAM: PORTABLE CHEST 1 VIEW COMPARISON:  PA and lateral chest x-ray of July 27, 2015 FINDINGS: There is confluent alveolar opacity in the right upper lobe. The interstitial markings in the mid and lower right lung are coarse. There is subtle increased density approximately 2 cm above the dome of the hemidiaphragm that is new. The heart is top-normal in size. The  pulmonary vascularity is not engorged. There is a permanent pacemaker defibrillator in stable position. The observed bony thorax exhibits no acute abnormality. IMPRESSION: Right upper lobe and left lower lobe pneumonia. Low-grade compensated CHF. Followup PA and lateral chest X-ray is recommended in 3-4 weeks following trial of antibiotic therapy to ensure resolution and exclude underlying malignancy. Electronically Signed   By: David  [name] M.D.   On: 10/27/2015 08:21     Latest Echo  Latest Cath   Medications:     Scheduled Medications: . ampicillin-sulbactam (UNASYN) IV  1.5 g Intravenous Q6H  . antiseptic oral rinse  7 mL Mouth Rinse QID  . aspirin  325 mg Per Tube Daily  . chlorhexidine gluconate (SAGE KIT)  15 mL Mouth Rinse BID  . famotidine (PEPCID) IV  20 mg Intravenous Q24H  . insulin aspart  0-9 Units Subcutaneous Q4H  . midazolam  4 mg Intravenous Once  . rosuvastatin  40 mg Per Tube q1800  . sodium chloride  1,000 mL Intravenous Once     Infusions: . sodium chloride 100 mL/hr (10/28/15 0116)  . amiodarone 30 mg/hr (10/28/15 0628)  . heparin 1,100 Units/hr (10/28/15 0514)     PRN Medications:  acetone (urine) test, fentaNYL (SUBLIMAZE) injection, midazolam, midazolam   Assessment   1. VT 2. NSTEMI 3. Chronic systolic HF 4. CAD 5. ?CVA  6. Sepsis 7. Acute respiratory failure requiring intubation  Plan    Improved today. Remains intubated. Weaning as tolerated. Vitals stable.   ?CVA vs seizure activity. CT with no acute findings. EEG with severe diffuse generalized slowing.  No seizures noted.   Troponin markedly positive > 65.00 x 2.  Will need ischemic work up once/if recovers.     Remains on heparin drip and amio drip with VT and NSTEMI. Goal K > 4.0 and Mg > 2.0  Per [employer] his device is compatible with MRI. If neuro would like MRI, would need to arrange for [facility] Jude rep to be present and monitory during study.   Prognosis extremely  guarded.  Length of Stay: 1   Shirley Friar PA-C 10/28/2015, 9:43 AM  Advanced Heart Failure Team Pager [phone] (M-F; 7a - 4p)  Please contact [facility] for night-coverage after hours (4p -7a ) and weekends on [email or web address]   Patient seen and examined with Oda Kilts, PA-C. We discussed all aspects of the encounter. I agree with the assessment and plan as stated above.   He has had a large NSTEMI. Remains intubated but now awake and following command. CVP 7-8. No futheer VT on IV amio. Electrolytes being supped. Remains on heparin. Will add low dose b-blocker as bp tolerates. Continue IV amio and statin. Will need to considercath next week if he continues to recover.   Last cath in 11/2013 demonstrated triple vessel coronary artery disease. With 99%p LAD (s/p previous anterior MI), LCX 95% and 100% non-dominant RCA. There was consideration of high risk revascularization of the circumflex lesion. However, Dr. Percival Spanish sent him for a stress viability study which demonstrated no evidence of viability. So options may be limited. Will need to review prior films with interventional team prior to repeat cath.   The patient is critically ill with multiple organ systems failure and requires high complexity decision making for assessment and support, frequent evaluation and titration of therapies, application of advanced monitoring technologies and extensive interpretation of multiple databases.   Critical Care Time devoted to patient care services described in this note is 35 Minutes.   Bensimhon, Daniel,MD 6:03 PM

## 2015-10-28 NOTE — Progress Notes (Signed)
ANTICOAGULATION CONSULT NOTE - Follow Up Consult  Pharmacy Consult for Heparin  Indication: chest pain/ACS  No Known Allergies  Patient Measurements: Height: 6\' 2"  (188 cm) Weight: 148 lb 2.4 oz (67.2 kg) IBW/kg (Calculated) : 82.2  Vital Signs: Temp: 98.4 F (36.9 C) (07/07 0342) Temp Source: Oral (07/07 0342) BP: 102/67 mmHg (07/07 0230) Pulse Rate: 80 (07/07 0300)  Labs:  Recent Labs  10/27/15 0757 10/27/15 0803 10/27/15 1124 10/27/15 1530 10/27/15 2047 10/28/15 0420  HGB 12.0* 13.6 11.5*  --   --  10.3*  HCT 38.0* 40.0 35.2*  --   --  31.7*  PLT 224  --  163  --   --  151  APTT 26  --  27  --   --   --   LABPROT 14.4  --  15.3*  --   --   --   INR 1.10  --  1.20  --   --   --   HEPARINUNFRC  --   --   --   --  0.23* <0.10*  CREATININE 1.90* 1.50* 1.61*  --   --  1.31*  TROPONINI 1.36*  --  20.31* >65.00* >65.00*  --     Estimated Creatinine Clearance: 50.6 mL/min (by C-G formula based on Cr of 1.31).    Assessment: Heparin level for markedly elevated troponin, HL is undetectable this AM, looks like plan from yesterday evening was to increase heparin but order was never placed, no issues overnight per RN.   Goal of Therapy:  Heparin level 0.3-0.7 units/ml Monitor platelets by anticoagulation protocol: Yes   Plan:  -Heparin 2000 units BOLUS -Increase heparin to 1100 units/hr -1200 HL -Daily CBC/HL -Monitor for bleeding  Narda Bonds 10/28/2015,5:14 AM

## 2015-10-28 NOTE — Consult Note (Signed)
   [facility] Inpatient Consult   10/28/2015  [name] [name] 08-05-1945 [id number]  Patient is currently active with [facility] for chronic disease management services.  Patient has been engaged by a RN [facility] TelephonicHealth Coach.  Alerted of the patient's admission. Chart review reveals Admitted to ICU 10/27/15 after being found down at home with possible CVA findings. Intubated for airway protection with vomiting. Had VT PTA with HV therapy. Remains intubated. Now responds to commands. Denies CP. Creatinine stable to improved. Weight shows up 5 lbs despite negative I/O yesterday per MD notes.   Will follow for progress and disposition needs with inpatient care management team.  Of note, [facility] Care Management services does not replace or interfere with any services that are needed or arranged by inpatient case management or social work.  For additional questions or referrals please contact:  Natividad Brood, RN BSN [facility] Liaison  [phone] business mobile phone Toll free office [phone]

## 2015-10-28 NOTE — Progress Notes (Signed)
ANTICOAGULATION CONSULT NOTE - Follow Up Consult  Pharmacy Consult for Heparin Indication: chest pain/ACS  No Known Allergies  Patient Measurements: Height: 6\' 2"  (188 cm) Weight: 153 lb 10.6 oz (69.7 kg) IBW/kg (Calculated) : 82.2  Vital Signs: Temp: 99 F (37.2 C) (07/07 1957) Temp Source: Oral (07/07 1957) BP: 116/72 mmHg (07/07 2000) Pulse Rate: 80 (07/07 2009)  Labs:  Recent Labs  10/27/15 0757 10/27/15 0803 10/27/15 1124 10/27/15 1530  10/27/15 2047 10/28/15 0420 10/28/15 1250 10/28/15 2016  HGB 12.0* 13.6 11.5*  --   --   --  10.3*  --   --   HCT 38.0* 40.0 35.2*  --   --   --  31.7*  --   --   PLT 224  --  163  --   --   --  151  --   --   APTT 26  --  27  --   --   --   --   --   --   LABPROT 14.4  --  15.3*  --   --   --   --   --   --   INR 1.10  --  1.20  --   --   --   --   --   --   HEPARINUNFRC  --   --   --   --   < > 0.23* <0.10* 0.20* 0.30  CREATININE 1.90* 1.50* 1.61*  --   --   --  1.31*  --   --   TROPONINI 1.36*  --  20.31* >65.00*  --  >65.00*  --   --   --   < > = values in this interval not displayed.  Estimated Creatinine Clearance: 52.5 mL/min (by C-G formula based on Cr of 1.31).    Assessment: 70yom continues on heparin for elevated troponin, new NSTEMI.  Heparin level now at goal at 0.3  Goal of Therapy:  Heparin level 0.3-0.7 units/ml Monitor platelets by anticoagulation protocol: Yes   Plan:  1) Increase heparin to 1350 units/hr 2) Follow up AM labs  Thank you Anette Guarneri, PharmD [phone]  10/28/2015,8:46 PM

## 2015-10-28 NOTE — Progress Notes (Signed)
ANTICOAGULATION CONSULT NOTE - Follow Up Consult  Pharmacy Consult for Heparin Indication: chest pain/ACS  No Known Allergies  Patient Measurements: Height: 6\' 2"  (188 cm) Weight: 153 lb 10.6 oz (69.7 kg) IBW/kg (Calculated) : 82.2  Vital Signs: Temp: 99.1 F (37.3 C) (07/07 1127) Temp Source: Oral (07/07 1127) BP: 108/67 mmHg (07/07 1307) Pulse Rate: 85 (07/07 1307)  Labs:  Recent Labs  10/27/15 0757 10/27/15 0803 10/27/15 1124 10/27/15 1530 10/27/15 2047 10/28/15 0420 10/28/15 1250  HGB 12.0* 13.6 11.5*  --   --  10.3*  --   HCT 38.0* 40.0 35.2*  --   --  31.7*  --   PLT 224  --  163  --   --  151  --   APTT 26  --  27  --   --   --   --   LABPROT 14.4  --  15.3*  --   --   --   --   INR 1.10  --  1.20  --   --   --   --   HEPARINUNFRC  --   --   --   --  0.23* <0.10* 0.20*  CREATININE 1.90* 1.50* 1.61*  --   --  1.31*  --   TROPONINI 1.36*  --  20.31* >65.00* >65.00*  --   --     Estimated Creatinine Clearance: 52.5 mL/min (by C-G formula based on Cr of 1.31).   Medications:  Heparin @ 1100 units/hr  Assessment: 70yom continues on heparin for elevated troponin, new NSTEMI. Heparin level has increased but still remains below goal at 0.2. CBC stable. No bleeding.  Goal of Therapy:  Heparin level 0.3-0.7 units/ml Monitor platelets by anticoagulation protocol: Yes   Plan:  1) Increase heparin to 1300 units/hr 2) Check heparin level in 6 hours  Deboraha Sprang 10/28/2015,1:35 PM

## 2015-10-28 NOTE — Progress Notes (Signed)
Interim Progress Note  MRI of brain reordered earlier today as ICU team originally told that AICD was compatible with MRI. Discussed with [employer] representative today and told that this was in fact true. Attempted to arrange for MRI to be performed with the presence of a [employer] representative. Received call from IR physician Dr. Nevada Crane and he explained that the AICD is NOT compatible with MRI. Per manufacturer specifications, it would be unsafe to do any MRI. Discussed with Dr. Nevada Crane the risks and benefits of doing the MRI, risks including the pacemaker stopping and myocardial ablation. The decision was made to not perform the MRI as risks do not outweigh the benefits at this time.    Carlyle Dolly, MD 10/28/2015, 5:26 PM PGY-2, [place]

## 2015-10-28 NOTE — Progress Notes (Signed)
PULMONARY / CRITICAL CARE MEDICINE   Name: [name] [name] MRN: [id number] DOB: 01/22/1946    ADMISSION DATE:  10/27/2015 CONSULTATION DATE:  10/27/15  REFERRING MD:  EDP  CHIEF COMPLAINT:  AMS  HISTORY OF PRESENT ILLNESS:   70 yo M with plethora of health issues, EF 15%, IACD, CVA rt side weakness 2015, IDDM who presents to [facility] ED with 24 hours of increased confusion, rt arm tremor, agitation. He vomited and most likely aspirated 7/6 and will require intubation for airway protection. Neuro and Cards will see patient, note he had VT in ER and IACD fired. We will admit to ICU, treat with abx,do nuero and cardiology work ups. He is a full code per wife and family.  SUBJECTIVE:  Pt did well overnight. Can nod head yes or no in response to questions. Denies chest pain or abdominal pain this morning.  REVIEW OF SYSTEMS:  Unable to obtain because patient is intubated.  VITAL SIGNS: BP 113/73 mmHg  Pulse 85  Temp(Src) 98.4 F (36.9 C) (Oral)  Resp 18  Ht 6\' 2"  (1.88 m)  Wt 69.7 kg (153 lb 10.6 oz)  BMI 19.72 kg/m2  SpO2 100%  HEMODYNAMICS:    VENTILATOR SETTINGS: Vent Mode:  [-] PRVC FiO2 (%):  [40 %-100 %] 40 % Set Rate:  [18 bmp-24 bmp] 18 bmp Vt Set:  [500 mL-650 mL] 500 mL PEEP:  [5 cmH20] 5 cmH20 Plateau Pressure:  [9 cmH20-20 cmH20] 9 cmH20  INTAKE / OUTPUT: I/O last 3 completed shifts: In: 982.7 [I.V.:882.7; IV Piggyback:100] Out: [id number]; Emesis/NG output:100]  PHYSICAL EXAMINATION: General: Awake, alert, opens eyes to voice Neuro: Follows commands, able to wiggle toes, answers questions  HEENT: [place]/AT,  Cardiovascular: RRR, 2/6 systolic murmur Lungs: STAB Abdomen: Soft, + BS, non-tender, non-distended Musculoskeletal: Intact Skin: No LE edema. No rash.  LABS:  BMET  Recent Labs Lab 10/27/15 0757 10/27/15 0803 10/27/15 1124 10/28/15 0420  NA 137 140 139 143  K 4.9 5.0 5.6* 3.9  CL 101 102 106 114*  CO2 23  --  24 25  BUN 27* 33*  25* 20  CREATININE 1.90* 1.50* 1.61* 1.31*  GLUCOSE 552* 548* 457* 129*    Electrolytes  Recent Labs Lab 10/27/15 0757 10/27/15 1124 10/28/15 0420  CALCIUM 9.1 8.2* 8.0*  MG  --  1.8  --   PHOS  --  3.1  --     CBC  Recent Labs Lab 10/27/15 0757 10/27/15 0803 10/27/15 1124 10/28/15 0420  WBC 18.3*  --  15.9* 12.2*  HGB 12.0* 13.6 11.5* 10.3*  HCT 38.0* 40.0 35.2* 31.7*  PLT 224  --  163 151    Coag's  Recent Labs Lab 10/27/15 0757 10/27/15 1124  APTT 26 27  INR 1.10 1.20    Sepsis Markers  Recent Labs Lab 10/27/15 0803 10/27/15 1124  LATICACIDVEN 6.11* 2.8*  PROCALCITON  --  0.33    ABG  Recent Labs Lab 10/27/15 1141 10/27/15 1811 10/28/15 0259  PHART 7.509* 7.403 7.409  PCO2ART 32.0* 39.4 39.2  PO2ART 430.0* 134.0* 144*    Liver Enzymes  Recent Labs Lab 10/27/15 0757 10/27/15 1124  AST 35 60*  ALT 27 27  ALKPHOS 142* 125  BILITOT 0.8 1.3*  ALBUMIN 3.2* 2.8*    Cardiac Enzymes  Recent Labs Lab 10/27/15 1124 10/27/15 1530 10/27/15 2047  TROPONINI 20.31* >65.00* >65.00*    Glucose  Recent Labs Lab 10/27/15 1322 10/27/15 1541 10/27/15 2009 10/27/15 2315 10/28/15  [place] 425* 294* 215* 174* 128*    Imaging Ct Head Wo Contrast  10/27/2015  CLINICAL DATA:  Confusion.  Altered mental status. EXAM: CT HEAD WITHOUT CONTRAST TECHNIQUE: Contiguous axial images were obtained from the base of the skull through the vertex without intravenous contrast. COMPARISON:  CT head 04/10/2015 FINDINGS: Moderate atrophy is unchanged. Ventricular enlargement consistent with atrophy. Mild chronic white matter hypodensity bilaterally is stable Negative for acute infarct. Negative for acute hemorrhage or mass. No shift of the midline structures. Negative calvarium. Gas in the soft tissues round skullbase likely is related to IV placement. IMPRESSION: Atrophy with chronic microvascular ischemia.  No acute abnormality. Electronically Signed    By: Franchot Gallo M.D.   On: 10/27/2015 08:50   Dg Chest Port 1 View  10/27/2015  CLINICAL DATA:  Central line placement EXAM: PORTABLE CHEST 1 VIEW COMPARISON:  10/27/2015 FINDINGS: Cardiomediastinal silhouette is stable. Worsening patchy pneumonia right upper lobe and right lower lobe. There is endotracheal tube in place with tip 5 cm above the carina. NG tube is noted with tip in proximal stomach just below the GE junction level. The NG tube should be advanced further into the stomach. There is left IJ central line with tip in upper SVC. No pneumothorax. IMPRESSION: Worsening patchy pneumonia right upper lobe and right lower lobe. There is endotracheal tube in place with tip 5 cm above the carina. NG tube is noted with tip in proximal stomach just below the GE junction level. The NG tube should be advanced further into the stomach. There is left IJ central line with tip in upper SVC. No pneumothorax. Electronically Signed   By: Lahoma Crocker M.D.   On: 10/27/2015 14:54   Dg Chest Portable 1 View  10/27/2015  CLINICAL DATA:  Possible aspiration, nor is the breath sounds, tachycardia; history of CVA, coronary artery disease, and diabetes,; current mental status change. EXAM: PORTABLE CHEST 1 VIEW COMPARISON:  PA and lateral chest x-ray of July 27, 2015 FINDINGS: There is confluent alveolar opacity in the right upper lobe. The interstitial markings in the mid and lower right lung are coarse. There is subtle increased density approximately 2 cm above the dome of the hemidiaphragm that is new. The heart is top-normal in size. The pulmonary vascularity is not engorged. There is a permanent pacemaker defibrillator in stable position. The observed bony thorax exhibits no acute abnormality. IMPRESSION: Right upper lobe and left lower lobe pneumonia. Low-grade compensated CHF. Followup PA and lateral chest X-ray is recommended in 3-4 weeks following trial of antibiotic therapy to ensure resolution and exclude underlying  malignancy. Electronically Signed   By: David  [name] M.D.   On: 10/27/2015 08:21    STUDIES:  EEG 7/6: severe diffuse generalized slowing, non-specific but consistent with a severe encephalopathic process, no evidence of seizure activity. CT Head W/O 7/6: Atrophy, no acute abnormalities Port CXR 7/6: worsening patchy pneumonia in the right upper lobe and right lower lobe.  MICROBIOLOGY: MRSA PCR (7/6): negative Urine Ctx (7/6) >>  Blood Ctx x2 (7/6) >> Tracheal Asp Ctx (7/7) >> Urine Strep Ag (7/6):  Negative Urine Legionella Ag (7/7)>>  ANTIBIOTICS: Unasyn 7/6>>>  SIGNIFICANT EVENTS: 7/6 - V tach in the ED, AICD fired  LINES/TUBES: OETT 7.5 7/6>>> OGT 7/6>>> L IJ CVL 7/6>>> FOLEY 7/6>>> PIV x1  DISCUSSION: 70 yo M with plethora of health issues, EF 15%, IACD, CVA rt side weakness 2015, IDDM who presents to [facility] ED with 24 hours of  increased confusion, rt arm tremor, agitation. He vomited and most likely aspirated 7/6 and required intubation for airway protection. Neuro and Cards will see patient, note he had VT in ER and IACD fired.   ASSESSMENT / PLAN:  PULMONARY A: Acute Hypoxic Respiratory Failure - Secondary to aspiration pneumonia. Aspiration PNA  P:  Continue PS wean SBT in AM Advance ETT 2.5cm today. Repeat CXR to confirm placement. Intermittent CXR  CARDIOVASCULAR A:  NSTEMI- Troponin I > 65 H/O Ischemic Cardiomyopathy w/ Systolic CHF- Last TTE w/ EF 15%, diffuse septal hypokinesis and apical akinesis along w/ cavity size severely dilated. IACD - Fired in ED 7/6. interrogated by Cards 7/6. Ventricular Tachycardia - IACD fired in ED 7/6. H/O Hyperlipidemia  P:  Cardiology following & appreciate recommendations Heparin gtt per pharmacy protocol Amiodarone gtt ASA 325mg  VT daily Crestor 40mg  VT daily Continuous telemetry monitoring Vitals per unit protocol Checking TTE Plan for LHC as he recovers Holding home Hydralazine, Imdur, Toresmide,  Spironolactone, Digoxin, Ivabradine (not on BB due to intolerance)  RENAL A:  Acute on CKD III- Improving. Baseline Cr 1.7-2.2. Hyperkalemia- Resolved. Lactic acidosis- Improving.  P:  Trending UOP with Foley IVFs: NS at 176ml/hr Monitor daily electrolytes and renal function Replacing electrolytes as indicated  GASTROINTESTINAL A:  Nutrition Probable Aspiration  P:  Nutrition consult for initiating tube feeds IV Pepcid 20mg  daily Speech Eval once extubated  HEMATOLOGIC A:  Leukocytosis- improving.   P:  Trending cell counts daily w/ CBC Heparin gtt per pharmacy protocol  INFECTIOUS A:  Aspiration Pneumonia - Right sided. Probable UTI  P:  Unasyn 1.5g IV q6hrs Cultures are pending Urinary Legionella pending Trend Procalcitonin per algorithm Tracheal aspirate culture today   ENDOCRINE A:  H/O IDDM Hyperglycemia - Resolved.  P:  Accu-checks q4hrs Sensitive SSI Holding home Lantus (20 units daily) Hemoglobin A1c pending  NEUROLOGIC A:  Probable Partial Seizure - Not seen on EEG. Acute Encephalopathy - Multifactorial. Improving. Possible New Acute CVA - Symptom onset 7/5. H/O CVA 2015 - Mild right-sided weakness & slurred speech.  P:  RASS Goal:  0 to -1 Fentanyl IV prn Versed IV prn Neurology consulted & following AED:  Per Neurology holding on Keppra unless symptoms recur (500mg  bid) Seizure preactions Re-Ordering MRI Brain w/ & w/o - St Jude Device is reportedly MRI compatible. PT/OT Consult once extubated  Hyman Bible, MD Family Medicine, PGY-2 10/28/2015, 6:28 AM   PCCM Attending Note: Patient seen and examined with resident physician. Please refer to her progress note which I reviewed in detail. Patient admitted with a complex medical history suggesting focal partial seizure and possible stroke starting on 7/5. Patient obviously has a right-sided aspiration pneumonia contributing to his acute hypoxic respiratory  failure. Respiratory status is improving and hopefully we will be able to have a spontaneous breathing trial tomorrow morning with possible extubation. Appreciate input from both cardiology and neurology. Continuing heparin infusion. Ordering transthoracic echocardiogram. Also reordering MRI of the brain and attempting to coordinate with device representative. Family updated by Dr. Ashok Cordia at length today.  I spent a total of 39 minutes of critical care time today caring for the patient, updating family at bedside, and reviewing the patient's electronic medical record.  Sonia Baller Ashok Cordia, M.D. [facility] Pulmonary & Critical Care Pager:  [phone] After 3pm or if no response, call [phone] 3:25 PM 10/28/2015

## 2015-10-28 NOTE — Patient Outreach (Signed)
[facility]) Care Management  10/28/2015  [name] [name] 09-13-1945 [id number]   Patient noted to be hospitalized on 10-27-15. Will send notification to hospital liaison for follow up.  Will send physician discipline closure letter.  Jone Baseman, RN, MSN [place] [phone]

## 2015-10-28 NOTE — Progress Notes (Addendum)
Neurology Progress Note  Subjective: He remains intubated in the ICU. No further seizure activity reported or observed. He is on antibiotics for aspiration pneumonia. His troponins have been elevated and he is being treated for NSTEMI. He remains on heparin and amiodarone drips. He is able to appropriately answer questions by nodding and shaking his head and presently denies any concerns.   Current Meds:   Current facility-administered medications:  .  0.9 %  sodium chloride infusion, , Intravenous, Continuous, Grace Bushy Minor, NP, Last Rate: 100 mL/hr at 10/28/15 0116, 100 mL/hr at 10/28/15 0116 .  acetone (urine) test strip 1 strip, 1 strip, Does not apply, PRN, Grace Bushy Minor, NP .  amiodarone (NEXTERONE PREMIX) 360-4.14 MG/200ML-% (1.8 mg/mL) IV infusion, 30 mg/hr, Intravenous, Continuous, Patsey Berthold, NP, Last Rate: 16.7 mL/hr at 10/28/15 0628, 30 mg/hr at 10/28/15 0628 .  ampicillin-sulbactam (UNASYN) 1.5 g in sodium chloride 0.9 % 50 mL IVPB, 1.5 g, Intravenous, Q6H, Meagan A Decker, RPH, 1.5 g at 10/28/15 0517 .  antiseptic oral rinse solution (CORINZ), 7 mL, Mouth Rinse, QID, Javier Glazier, MD, 7 mL at 10/28/15 0450 .  aspirin tablet 325 mg, 325 mg, Per Tube, Daily, Rahul P Desai, PA-C, 325 mg at 10/27/15 1516 .  chlorhexidine gluconate (SAGE KIT) (PERIDEX) 0.12 % solution 15 mL, 15 mL, Mouth Rinse, BID, Javier Glazier, MD, 15 mL at 10/28/15 0820 .  famotidine (PEPCID) IVPB 20 mg premix, 20 mg, Intravenous, Q24H, Tanda Rockers, MD, 20 mg at 10/27/15 1647 .  fentaNYL (SUBLIMAZE) injection 50 mcg, 50 mcg, Intravenous, Q2H PRN, Grace Bushy Minor, NP, 50 mcg at 10/27/15 1701 .  heparin ADULT infusion 100 units/mL (25000 units/254m sodium chloride 0.45%), 1,100 Units/hr, Intravenous, Continuous, JErenest Blank RPH, Last Rate: 11 mL/hr at 10/28/15 0514, 1,100 Units/hr at 10/28/15 0514 .  insulin aspart (novoLOG) injection 0-9 Units, 0-9 Units, Subcutaneous, Q4H, JJavier Glazier  MD, 2 Units at 10/28/15 [phone].  midazolam (VERSED) injection 1 mg, 1 mg, Intravenous, Q15 min PRN, WGrace BushyMinor, NP, 1 mg at 10/27/15 1417 .  midazolam (VERSED) injection 1 mg, 1 mg, Intravenous, Q2H PRN, WGrace BushyMinor, NP .  midazolam (VERSED) injection 4 mg, 4 mg, Intravenous, Once, DJuanito Doom MD, 4 mg at 10/27/15 1018 .  sodium chloride 0.9 % bolus 1,000 mL, 1,000 mL, Intravenous, Once, WGrace BushyMinor, NP, 1,000 mL at 10/27/15 1038  Objective:  Temp:  [97.3 F (36.3 C)-100.8 F (38.2 C)] 98.9 F (37.2 C) (07/07 0747) Pulse Rate:  [74-121] 85 (07/07 0750) Resp:  [14-24] 20 (07/07 0750) BP: (77-120)/(58-90) 112/71 mmHg (07/07 0750) SpO2:  [95 %-100 %] 100 % (07/07 0750) FiO2 (%):  [40 %-100 %] 40 % (07/07 0750) Weight:  [67.2 kg (148 lb 2.4 oz)-69.7 kg (153 lb 10.6 oz)] 69.7 kg (153 lb 10.6 oz) (07/07 0500)  General: WDWN Caucasian man lying in bed, intubated. He is alert and readily tracks me around the room. He is able to correctly answer yes/no questions by nodding/shaking his head. He follows midline and appendicular commands. HEENT: Neck is supple without lymphadenopathy. ETT in place. Sclerae are anicteric. There is no conjunctival injection.  CV: Regular, 2/6 systolic murmur LSB. Carotid pulses are 2+ and symmetric with no bruits. Distal pulses 2+ and symmetric.   Neuro: MS: As noted above.  CN: Pupils are equal and reactive from 3-->2 mm bilaterally. EOMI with breakup of smooth pursuits in all directions, no nystagmus. Facial  sensation appears intact to light touch. Corneals are intact and symmetric. Face is symmetric at rest with normal strength and mobility though it is partly obscured by tubes and tape. Hearing is intact to conversational voice.  Motor: He has reduced bulk throughout. Normal tone and strength throughout. Mitten restraints are in place. No tremor or other abnormal movements are observed.  Sensation: Intact to light touch and pinprick. DTRs: 2+ on  the L, 3+ on the R. Toes are downgoing bilaterally. No pathological reflexes.   Labs: Lab Results  Component Value Date   WBC 12.2* 10/28/2015   HGB 10.3* 10/28/2015   HCT 31.7* 10/28/2015   PLT 151 10/28/2015   GLUCOSE 129* 10/28/2015   CHOL 120 03/09/2014   TRIG 123 03/09/2014   HDL 39* 03/09/2014   LDLDIRECT 100.5 02/08/2012   LDLCALC 56 03/09/2014   ALT 27 10/27/2015   AST 60* 10/27/2015   NA 143 10/28/2015   K 3.9 10/28/2015   CL 114* 10/28/2015   CREATININE 1.31* 10/28/2015   BUN 20 10/28/2015   CO2 25 10/28/2015   TSH 1.785 07/26/2015   PSA 4.89* 08/01/2015   INR 1.20 10/27/2015   HGBA1C 12.0* 10/27/2015   MICROALBUR 43.3* 02/08/2012   CBC Latest Ref Rng 10/28/2015 10/27/2015 10/27/2015  WBC 4.0 - 10.5 K/uL 12.2(H) 15.9(H) -  Hemoglobin 13.0 - 17.0 g/dL 10.3(L) 11.5(L) 13.6  Hematocrit 39.0 - 52.0 % 31.7(L) 35.2(L) 40.0  Platelets 150 - 400 K/uL 151 163 -    Lab Results  Component Value Date   HGBA1C 12.0* 10/27/2015   Lab Results  Component Value Date   ALT 27 10/27/2015   AST 60* 10/27/2015   ALKPHOS 125 10/27/2015   BILITOT 1.3* 10/27/2015   EEG showed severe diffuse slowing, no seizures or epileptiform discharges.   A/P:    1. Probable partial seizure: I suspect he was having simple partial seizures involving the right arm based upon available information. His more acute deterioration on the day of admission seems more likely to be due to other issues including pneumonia and MI. He is not showing any focal deficits today to suggest he has had an acute stroke.   EEG did not show any ongoing seizures. He is unable to have an MRI to exclude occult small stroke. May consider repeating [place] without contrast though given absence of deficits will defer for now. Hold on AEDs since he hasn't seized in hospital. If seizures recur, would start Keppra 500 mg BID. Seizure precautions.   2. Encephalopathy: This is likely multifactorial in etiology with contributions from  PNA, MI, hyperglycemia. This has improved as he is not able to follow commands. Wean extubation as tolerated. Continue to treat infection and optimize metabolic status as you are. Minimize sedation as much as possible.   This patient is critically ill and at significant risk of neurological worsening, death and care requires constant monitoring of vital signs, hemodynamics,respiratory and cardiac monitoring, neurological assessment, discussion with family, other specialists and medical decision making of high complexity. A total of 30 minutes of critical care time was spent on this patient.   Melba Coon, MD [facility]

## 2015-10-28 NOTE — Care Management Important Message (Signed)
Important Message  Patient Details  Name: [name] [name] MRN: [id number] Date of Birth: [birth year]   Medicare Important Message Given:  Yes    [name] 10/28/2015, 10:27 AM

## 2015-10-28 NOTE — Care Management Note (Signed)
Case Management Note  Patient Details  Name: [name] [name] MRN: [id number] Date of Birth: 1945-06-25  Subjective/Objective:   Pt admitted with aspiration pna                  Action/Plan:  Pt from home with wife.  CM will continue to monitor for disposition needs   Expected Discharge Date:                  Expected Discharge Plan:  [place]  In-House Referral:     Discharge planning Services  CM Consult  Post Acute Care Choice:    Choice offered to:     DME Arranged:    DME Agency:     HH Arranged:    HH Agency:     Status of Service:  In process, will continue to follow  If discussed at Long Length of Stay Meetings, dates discussed:    Additional Comments:  Maryclare Labrador, RN 10/28/2015, 11:09 AM

## 2015-10-29 ENCOUNTER — Inpatient Hospital Stay (HOSPITAL_COMMUNITY): Payer: Commercial Managed Care - HMO

## 2015-10-29 DIAGNOSIS — I213 ST elevation (STEMI) myocardial infarction of unspecified site: Secondary | ICD-10-CM

## 2015-10-29 DIAGNOSIS — J9621 Acute and chronic respiratory failure with hypoxia: Secondary | ICD-10-CM

## 2015-10-29 LAB — BASIC METABOLIC PANEL
Anion gap: 5 (ref 5–15)
BUN: 19 mg/dL (ref 6–20)
CHLORIDE: 114 mmol/L — AB (ref 101–111)
CO2: 22 mmol/L (ref 22–32)
Calcium: 7.8 mg/dL — ABNORMAL LOW (ref 8.9–10.3)
Creatinine, Ser: 1.21 mg/dL (ref 0.61–1.24)
GFR calc Af Amer: >60 mL/min (ref >60)
GFR calc non Af Amer: 59 mL/min — ABNORMAL LOW (ref >60)
GLUCOSE: 301 mg/dL — AB (ref 65–99)
POTASSIUM: 4 mmol/L (ref 3.5–5.1)
Sodium: 141 mmol/L (ref 135–145)

## 2015-10-29 LAB — HEPARIN LEVEL (UNFRACTIONATED)
Heparin Unfractionated: 0.17 IU/mL — ABNORMAL LOW (ref 0.30–0.70)
Heparin Unfractionated: 0.33 IU/mL (ref 0.30–0.70)

## 2015-10-29 LAB — CBC
HEMATOCRIT: 35.3 % — AB (ref 39.0–52.0)
Hemoglobin: 10.9 g/dL — ABNORMAL LOW (ref 13.0–17.0)
MCH: 28.2 pg (ref 26.0–34.0)
MCHC: 30.9 g/dL (ref 30.0–36.0)
MCV: 91.2 fL (ref 78.0–100.0)
Platelets: 168 10*3/uL (ref 150–400)
RBC: 3.87 MIL/uL — ABNORMAL LOW (ref 4.22–5.81)
RDW: 16.1 % — AB (ref 11.5–15.5)
WBC: 12 10*3/uL — ABNORMAL HIGH (ref 4.0–10.5)

## 2015-10-29 LAB — GLUCOSE, CAPILLARY
GLUCOSE-CAPILLARY: 258 mg/dL — AB (ref 65–99)
GLUCOSE-CAPILLARY: 272 mg/dL — AB (ref 65–99)
GLUCOSE-CAPILLARY: 322 mg/dL — AB (ref 65–99)
Glucose-Capillary: 312 mg/dL — ABNORMAL HIGH (ref 65–99)
Glucose-Capillary: 195 mg/dL — ABNORMAL HIGH (ref 65–99)
Glucose-Capillary: 122 mg/dL — ABNORMAL HIGH (ref 65–99)

## 2015-10-29 LAB — PHOSPHORUS
Phosphorus: 2.4 mg/dL — ABNORMAL LOW (ref 2.5–4.6)
Phosphorus: 2.1 mg/dL — ABNORMAL LOW (ref 2.5–4.6)

## 2015-10-29 LAB — ECHOCARDIOGRAM COMPLETE
Height: 74 in
Weight: 2459.2 oz

## 2015-10-29 LAB — URINE CULTURE: Special Requests: NORMAL

## 2015-10-29 LAB — MAGNESIUM
Magnesium: 2.2 mg/dL (ref 1.7–2.4)
Magnesium: 2.1 mg/dL (ref 1.7–2.4)

## 2015-10-29 MED ORDER — WHITE PETROLATUM GEL
Status: AC
  Administered 2015-10-30: 0.2
  Filled 2015-10-29: qty 1

## 2015-10-29 MED ORDER — INSULIN GLARGINE 100 UNIT/ML ~~LOC~~ SOLN
10.0000 [IU] | Freq: Every day | SUBCUTANEOUS | Status: DC
  Administered 2015-10-29 – 2015-10-31 (×3): 10 [IU] via SUBCUTANEOUS
  Filled 2015-10-29 (×5): qty 0.1

## 2015-10-29 MED ORDER — CARVEDILOL 3.125 MG PO TABS
3.1250 mg | ORAL_TABLET | Freq: Two times a day (BID) | ORAL | Status: DC
  Administered 2015-10-29 – 2015-11-06 (×14): 3.125 mg via ORAL
  Filled 2015-10-29 (×15): qty 1

## 2015-10-29 NOTE — Progress Notes (Signed)
Neurology Note   Called to see patient by RN. She states family is present and has voiced concern that his right face and arm are now weaker once again and his slurred speech is worse than when he came in. On my initial assessment in the ED on the day of admission, his RUE was flaccid with no movement and his RLE was mildly weak. Since I saw the patient this morning, he has been extubated. He is currently not endorsing any complaints. His wife is present at the bedside and is concerned because she feels that his speech is slurred.   PE: General: He is sitting up in bed visiting with his family. He is alert and follows commands briskly. His speech is mildly dysarthric. He does not demonstrate any evidence of aphasia. Neurologic: Cranial nerves: Extraocular movements are intact. Visual fields appear full to confrontation. Facial sensation is intact to light touch. There is some slight flattening of the right nasolabial fold but he has normal strength with facial muscles. Hearing is intact conversational voice. The palate elevates symmetrically and the uvula is midline. Bilateral sternomastoid and trapezius strength is 5 out of 5. Tongue is midline. Motor: He shows normal bulk, tone, strength throughout. There appears to be some mild apraxia with the right upper extremity. No drift. No abnormal movements. Sensation: Intact to light touch. Coordination: Finger-to-nose is slow and more delivered with the right hand compared to the left. No overt dysmetria. DTRs: 2+, symmetric.  Impression: 1. Possible acute stroke: At this time I do not see anything to suggest worsening of his prior neurological deficits. He really does not have any actual weakness on the right side though he may have some apraxia or perhaps some mild deficits with motor planning. Speech is mildly dysarthric but this will need to be observed as he just had his endotracheal tube removed today. He does have an MRI of the brain pending and  this should be reviewed upon its completion. A small acute infarct cannot be completely excluded based on clinical exam alone and MRI will be the best test to help characterize this if it exists.  2. Simple partial seizures: On speaking with the patient right now, he is able to tell me more about the seizures that he was having in his right arm prior to presentation. He states that his right arm simply start jerking uncontrollably. This might last for maybe 60-90 seconds and then stop. However, it would occur intermittently over the course of 10 days. He never had any involvement of any other areas of has body. There was never any associated change in his level of consciousness. No one has observed generalized tonic clonic activity. He denies any previous history of seizures. He has not had any seizure-like activity since he has been in the hospital.  3. Right upper extremity apraxia: He has intact motor strength but does have difficulty with motor planning and praxis. Recommend occupational therapy consultation to evaluate and treat.  This is all discussed with the patient's wife and daughters at the bedside. They were given the opportunity to ask any questions and these were addressed to their satisfaction.

## 2015-10-29 NOTE — Progress Notes (Signed)
PCCM Attending:  Patient tolerating SBT on PS 0/5 well. TV >600cc. Hemodynamically stable. Checking for cuff leak & if present extubating patient. Daughter updated at bedside.   Sonia Baller Ashok Cordia, M.D. [facility] Pulmonary & Critical Care Pager:  [phone] After 3pm or if no response, call [phone] 11:46 AM 10/29/2015

## 2015-10-29 NOTE — Progress Notes (Addendum)
ANTICOAGULATION CONSULT NOTE Pharmacy Consult for Heparin Indication: chest pain/ACS  No Known Allergies  Patient Measurements: Height: 6\' 2"  (188 cm) Weight: 153 lb 11.2 oz (69.718 kg) IBW/kg (Calculated) : 82.2  Vital Signs: Temp: 99.5 F (37.5 C) (07/08 0318) Temp Source: Oral (07/08 0318) BP: 105/68 mmHg (07/08 0500) Pulse Rate: 86 (07/08 0500)  Labs:  Recent Labs  10/27/15 0757 10/27/15 0803 10/27/15 1124 10/27/15 1530  10/27/15 2047 10/28/15 0420 10/28/15 1250 10/28/15 2016 10/29/15 0459  HGB 12.0* 13.6 11.5*  --   --   --  10.3*  --   --  10.9*  HCT 38.0* 40.0 35.2*  --   --   --  31.7*  --   --  35.3*  PLT 224  --  163  --   --   --  151  --   --  168  APTT 26  --  27  --   --   --   --   --   --   --   LABPROT 14.4  --  15.3*  --   --   --   --   --   --   --   INR 1.10  --  1.20  --   --   --   --   --   --   --   HEPARINUNFRC  --   --   --   --   < > 0.23* <0.10* 0.20* 0.30 0.17*  CREATININE 1.90* 1.50* 1.61*  --   --   --  1.31*  --   --   --   TROPONINI 1.36*  --  20.31* >65.00*  --  >65.00*  --   --   --   --   < > = values in this interval not displayed.  Estimated Creatinine Clearance: 52.5 mL/min (by C-G formula based on Cr of 1.31).  Assessment: 70 yo mal;e with NSTEMI/VT for heparin Goal of Therapy:  Heparin level 0.3-0.7 units/ml Monitor platelets by anticoagulation protocol: Yes   Plan:  Increase Heparin 1550 units/hr Check heparin level in 8 hours.   Phillis Knack, PharmD, BCPS  10/29/2015,5:30 AM   Addendum: Heparin level is now therapeutic at 0.33. Continue heparin at 1550 units/hr and follow up AM labs.  Nena Jordan, PharmD, BCPS 10/29/2015, 4:27 PM

## 2015-10-29 NOTE — Progress Notes (Addendum)
Neurology Progress Note  Subjective: No significant overnight events. He remains intubated in the ICU. He is able to answer questions by nodding and shaking his head. He indicates no complaints at this time.   Current Meds:   Current facility-administered medications:  .  0.9 %  sodium chloride infusion, , Intravenous, Continuous, William S Minor, NP, Last Rate: 100 mL/hr at 10/29/15 0630 .  acetone (urine) test strip 1 strip, 1 strip, Does not apply, PRN, Grace Bushy Minor, NP .  amiodarone (NEXTERONE PREMIX) 360-4.14 MG/200ML-% (1.8 mg/mL) IV infusion, 30 mg/hr, Intravenous, Continuous, Amber Sena Slate, NP, Last Rate: 16.7 mL/hr at 10/29/15 0555, 30 mg/hr at 10/29/15 0555 .  ampicillin-sulbactam (UNASYN) 1.5 g in sodium chloride 0.9 % 50 mL IVPB, 1.5 g, Intravenous, Q6H, Meagan A Decker, RPH, 1.5 g at 10/29/15 0409 .  antiseptic oral rinse solution (CORINZ), 7 mL, Mouth Rinse, QID, Javier Glazier, MD, 7 mL at 10/29/15 0410 .  aspirin tablet 325 mg, 325 mg, Per Tube, Daily, Rahul P Desai, PA-C, 325 mg at 10/28/15 1048 .  chlorhexidine gluconate (SAGE KIT) (PERIDEX) 0.12 % solution 15 mL, 15 mL, Mouth Rinse, BID, Javier Glazier, MD, 15 mL at 10/28/15 2012 .  famotidine (PEPCID) IVPB 20 mg premix, 20 mg, Intravenous, Q24H, Tanda Rockers, MD, 20 mg at 10/28/15 1636 .  feeding supplement (VITAL AF 1.2 CAL) liquid 1,000 mL, 1,000 mL, Per Tube, Continuous, Javier Glazier, MD, Last Rate: 65 mL/hr at 10/29/15 0420, 1,000 mL at 10/29/15 0420 .  fentaNYL (SUBLIMAZE) injection 50 mcg, 50 mcg, Intravenous, Q2H PRN, Grace Bushy Minor, NP, 50 mcg at 10/27/15 1701 .  heparin ADULT infusion 100 units/mL (25000 units/216m sodium chloride 0.45%), 1,550 Units/hr, Intravenous, Continuous, DJuanito Doom MD, Last Rate: 15.5 mL/hr at 10/29/15 0545, 1,550 Units/hr at 10/29/15 0545 .  insulin aspart (novoLOG) injection 0-9 Units, 0-9 Units, Subcutaneous, Q4H, JJavier Glazier MD, 5 Units at 10/29/15 0410 .   midazolam (VERSED) injection 1 mg, 1 mg, Intravenous, Q15 min PRN, WGrace BushyMinor, NP, 1 mg at 10/27/15 1417 .  midazolam (VERSED) injection 1 mg, 1 mg, Intravenous, Q2H PRN, WGrace BushyMinor, NP, 1 mg at 10/29/15 0203 .  midazolam (VERSED) injection 4 mg, 4 mg, Intravenous, Once, DJuanito Doom MD, 4 mg at 10/27/15 1018 .  rosuvastatin (CRESTOR) tablet 40 mg, 40 mg, Per Tube, q1800, JJavier Glazier MD, 40 mg at 10/28/15 1729 .  sodium chloride 0.9 % bolus 1,000 mL, 1,000 mL, Intravenous, Once, WGrace BushyMinor, NP, 1,000 mL at 10/27/15 1038  Objective:  Temp:  [98.4 F (36.9 C)-99.5 F (37.5 C)] 99.5 F (37.5 C) (07/08 0318) Pulse Rate:  [75-106] 87 (07/08 0700) Resp:  [19-28] 28 (07/08 0343) BP: (99-135)/(67-86) 112/78 mmHg (07/08 0700) SpO2:  [92 %-100 %] 100 % (07/08 0700) FiO2 (%):  [40 %] 40 % (07/08 0500) Weight:  [69.718 kg (153 lb 11.2 oz)] 69.718 kg (153 lb 11.2 oz) (07/08 0412)  General: WDWN Caucasian man lying in bed, intubated. He opens his eyes readily to voice and will follow all midline and appendicular commands for me.  HEENT: Neck is supple without lymphadenopathy. ETT in place. Sclerae are anicteric. There is no conjunctival injection.  CV: Regular, no murmur. Carotid pulses are 2+ and symmetric with no bruits. Distal pulses 2+ and symmetric.  Lungs: CTAB on anterior auscultation.  Extremities: No C/C/E. Neuro: MS: As noted above. CN: Pupils are equal and reactive from 3-->2  mm bilaterally. EOMI with breakup of smooth pursuits, no nystagmus. Facial sensation is intact to light touch. Face is symmetric at rest with normal strength and mobility though the lower portion is obscured by ETT and tape. Hearing is intact to conversational voice.  Motor: Normal bulk, tone, and strength throughout. No tremor or other abnormal movements are observed.  Sensation: Intact to light touch. DTRs: 3+, symmetric. He has bilateral crossed adductors. Toes are downgoing bilaterally.   Coordination and gait: Deferred at this time.  Labs: Lab Results  Component Value Date   WBC 12.0* 10/29/2015   HGB 10.9* 10/29/2015   HCT 35.3* 10/29/2015   PLT 168 10/29/2015   GLUCOSE 301* 10/29/2015   CHOL 120 03/09/2014   TRIG 123 03/09/2014   HDL 39* 03/09/2014   LDLDIRECT 100.5 02/08/2012   LDLCALC 56 03/09/2014   ALT 27 10/27/2015   AST 60* 10/27/2015   NA 141 10/29/2015   K 4.0 10/29/2015   CL 114* 10/29/2015   CREATININE 1.21 10/29/2015   BUN 19 10/29/2015   CO2 22 10/29/2015   TSH 1.785 07/26/2015   PSA 4.89* 08/01/2015   INR 1.20 10/27/2015   HGBA1C 12.0* 10/27/2015   MICROALBUR 43.3* 02/08/2012   CBC Latest Ref Rng 10/29/2015 10/28/2015 10/27/2015  WBC 4.0 - 10.5 K/uL 12.0(H) 12.2(H) 15.9(H)  Hemoglobin 13.0 - 17.0 g/dL 10.9(L) 10.3(L) 11.5(L)  Hematocrit 39.0 - 52.0 % 35.3(L) 31.7(L) 35.2(L)  Platelets 150 - 400 K/uL 168 151 163    Lab Results  Component Value Date   HGBA1C 12.0* 10/27/2015   Lab Results  Component Value Date   ALT 27 10/27/2015   AST 60* 10/27/2015   ALKPHOS 125 10/27/2015   BILITOT 1.3* 10/27/2015     A/P:  He remains critically ill in the ICU.   1. Probable partial seizure: History suggests simple partial seizures involving the right arm. His more acute deterioration on the day of admission seems more likely to be due to other issues including pneumonia and MI though secondarily generalized seizure activity is also a possibility. He has not had any witnessed seizures since his admission. EEG did not show any ongoing seizures. His pacemaker is MRI-compatible so MRI has been ordered and is pending. Hold on AEDs since he hasn't seized in hospital. If seizures recur, would start Keppra 500 mg BID. Seizure precautions.   2. Encephalopathy: This is likely multifactorial in etiology with contributions from PNA, MI, hyperglycemia, possibly seizure. This has improved. Wean to extubation as tolerated. Continue to treat infection and optimize  metabolic status as you are. Minimize sedation as much as possible.   3. Cerebrovascular disease: He has chronic small vessel disease with history of remote stroke as well. His known cerebrovascular risk factors include HTN, DM, CAD, OSA with CPAP non-compliance, prior stroke, age. He has no new focal deficit to suggest a new stroke but MRI is pending to further assess. Continue with aggressive risk factor modification. Continue aspirin and statin.   This patient is critically ill and at significant risk of neurological worsening, death and care requires constant monitoring of vital signs, hemodynamics,respiratory and cardiac monitoring, neurological assessment, discussion with family, other specialists and medical decision making of high complexity. A total of 30 minutes of critical care time was spent on this case.   Melba Coon, MD [facility]

## 2015-10-29 NOTE — Progress Notes (Signed)
PULMONARY / CRITICAL CARE MEDICINE   Name: [name] [name] MRN: [id number] DOB: February 28, 1946    ADMISSION DATE:  10/27/2015 CONSULTATION DATE:  10/27/15  REFERRING MD:  EDP  CHIEF COMPLAINT:  AMS  HISTORY OF PRESENT ILLNESS:   70 yo M with plethora of health issues, EF 15%, IACD, CVA rt side weakness 2015, IDDM who presents to [facility] ED with 24 hours of increased confusion, rt arm tremor, agitation. He vomited and most likely aspirated 7/6 and will require intubation for airway protection. Neuro and Cards will see patient, note he had VT in ER and IACD fired. We will admit to ICU, treat with abx,do nuero and cardiology work ups. He is a full code per wife and family.  SUBJECTIVE: No acute events overnight. Patient more hyperglycemic.   REVIEW OF SYSTEMS:  Unable to obtain because patient is intubated.  VITAL SIGNS: BP 112/78 mmHg  Pulse 87  Temp(Src) 99.5 F (37.5 C) (Oral)  Resp 28  Ht 6\' 2"  (1.88 m)  Wt 153 lb 11.2 oz (69.718 kg)  BMI 19.73 kg/m2  SpO2 100%  HEMODYNAMICS:    VENTILATOR SETTINGS: Vent Mode:  [-] PRVC FiO2 (%):  [40 %] 40 % Set Rate:  [18 bmp] 18 bmp Vt Set:  [500 mL] 500 mL PEEP:  [5 cmH20] 5 cmH20 Plateau Pressure:  [12 cmH20-14 cmH20] 14 cmH20  INTAKE / OUTPUT: I/O last 3 completed shifts: In: 5917.8 [I.V.:4475.8; NG/GT:1092; IV Piggyback:350] Out: 2280 [Urine:1880; Emesis/NG output:400]  PHYSICAL EXAMINATION: General: Awake. No distress. Undergoing TTE currently. Neuro: Nods to questions. Following commands by wiggling toes. HEENT: ETT in place. No scleral icterus or injection. Cardiovascular: Regular rate. No edema.  Lungs: Clear to auscultation bilaterally. Symmetric chest rise on ventilator. Normal work of breathing on PS 5/5 FiO2 0.4. Abdomen: Soft. Nondistended. Normal bowel sounds. Musculoskeletal: No joint deformity or effusion.  Skin: Warm & dry. No rash on exposed skin.  LABS:  BMET  Recent Labs Lab 10/27/15 1124  10/28/15 0420 10/29/15 0459  NA 139 143 141  K 5.6* 3.9 4.0  CL 106 114* 114*  CO2 24 25 22   BUN 25* 20 19  CREATININE 1.61* 1.31* 1.21  GLUCOSE 457* 129* 301*    Electrolytes  Recent Labs Lab 10/27/15 1124 10/28/15 0420 10/28/15 1250 10/28/15 1736 10/29/15 0459  CALCIUM 8.2* 8.0*  --   --  7.8*  MG 1.8  --  1.8 2.4 2.2  PHOS 3.1  --  2.9 2.9 2.4*    CBC  Recent Labs Lab 10/27/15 1124 10/28/15 0420 10/29/15 0459  WBC 15.9* 12.2* 12.0*  HGB 11.5* 10.3* 10.9*  HCT 35.2* 31.7* 35.3*  PLT 163 151 168    Coag's  Recent Labs Lab 10/27/15 0757 10/27/15 1124  APTT 26 27  INR 1.10 1.20    Sepsis Markers  Recent Labs Lab 10/27/15 0803 10/27/15 1124  LATICACIDVEN 6.11* 2.8*  PROCALCITON  --  0.33    ABG  Recent Labs Lab 10/27/15 1141 10/27/15 1811 10/28/15 0259  PHART 7.509* 7.403 7.409  PCO2ART 32.0* 39.4 39.2  PO2ART 430.0* 134.0* 144*    Liver Enzymes  Recent Labs Lab 10/27/15 0757 10/27/15 1124  AST 35 60*  ALT 27 27  ALKPHOS 142* 125  BILITOT 0.8 1.3*  ALBUMIN 3.2* 2.8*    Cardiac Enzymes  Recent Labs Lab 10/27/15 1124 10/27/15 1530 10/27/15 2047  TROPONINI 20.31* >65.00* >65.00*    Glucose  Recent Labs Lab 10/28/15 0745 10/28/15 1120 10/28/15 1556  10/28/15 2000 10/28/15 2335 10/29/15 0321  GLUCAP 157* 166* 134* 195* 272* 258*    Imaging Dg Chest Port 1 View  10/28/2015  CLINICAL DATA:  Respiratory failure.  ET tube placement EXAM: PORTABLE CHEST 1 VIEW COMPARISON:  10/27/2015 FINDINGS: Endotracheal tube with the tip 3.8 cm above the carina. Left jugular central venous catheter with the tip projecting over the upper SVC. Patchy right upper lobe airspace disease mildly improved compared with the prior examination most consistent with pneumonia. Mild airspace disease in the right lower lung. No pleural effusion or pneumothorax. Stable cardiomediastinal silhouette. Single lead AICD. Nasogastric tube coursing below the  diaphragm. The osseous structures are unremarkable. IMPRESSION: 1. Support lines and tubing in satisfactory position. 2. Patchy right upper lobe airspace disease mildly improved compared with the prior examination consistent with persistent pneumonia. Mild airspace disease in the right lower lung likely reflecting pneumonia. Electronically Signed   By: Kathreen Devoid   On: 10/28/2015 10:10    STUDIES:  EEG 7/6: severe diffuse generalized slowing, non-specific but consistent with a severe encephalopathic process, no evidence of seizure activity. CT Head W/O 7/6: Atrophy, no acute abnormalities Port CXR 7/6: worsening patchy pneumonia in the right upper lobe and right lower lobe.  MICROBIOLOGY: MRSA PCR (7/6): negative Urine Ctx (7/6) >>  Blood Ctx x2 (7/6) >> Tracheal Asp Ctx (7/7) >> Urine Strep Ag (7/6):  Negative Urine Legionella Ag (7/7)>>  ANTIBIOTICS: Unasyn 7/6>>>  SIGNIFICANT EVENTS: 7/6 - V tach in the ED, AICD fired  LINES/TUBES: OETT 7.5 7/6>>> OGT 7/6>>> L IJ CVL 7/6>>> FOLEY 7/6>>> PIV x1  ASSESSMENT / PLAN:  PULMONARY A: Acute Hypoxic Respiratory Failure - Secondary to aspiration pneumonia. Aspiration PNA  P:  SBT on PS 0/5 FiO2 0.4 Probable extubation today  CARDIOVASCULAR A:  NSTEMI- Peak Troponin I > 65 H/O Ischemic Cardiomyopathy w/ Systolic CHF- Last TTE w/ EF 15%, diffuse septal hypokinesis and apical akinesis along w/ cavity size severely dilated. IACD - Fired in ED 7/6. interrogated by Cards 7/6. Ventricular Tachycardia - IACD fired in ED 7/6. H/O Hyperlipidemia  P:  Cardiology following & appreciate recommendations Heparin gtt per pharmacy protocol & can likely stop tomorrow per Cardiology Amiodarone gtt ASA 325mg  VT daily Crestor 40mg  VT daily Started on Coreg 3.125mg  bid per Cardiology Continuous telemetry monitoring Vitals per unit protocol Plan for LHC as he recovers Holding home Hydralazine, Imdur, Toresmide, Spironolactone,  Digoxin, Ivabradine (not on BB due to intolerance) TTE pending  RENAL A:  Acute on CKD III- Improving. Baseline Cr 1.7-2.2. Hyperkalemia- Resolved. Lactic acidosis- Resolved.  P:  Trending UOP with Foley IVFs: NS at 176ml/hr Monitor daily electrolytes and renal function Replacing electrolytes as indicated  GASTROINTESTINAL A:  Nutrition Probable Aspiration  P:  Nutrition consult for initiating tube feeds IV Pepcid 20mg  daily Speech Eval once extubated  HEMATOLOGIC A:  Leukocytosis- Stable. Anemia - Mild. No signs of active bleeding.  P:  Trending cell counts daily w/ CBC Heparin gtt per pharmacy protocol  INFECTIOUS A:  Aspiration Pneumonia - Right sided. Probable UTI  P:  Unasyn 1.5g IV q6hrs Day #3 Cultures are pending Urinary Legionella pending Trend Procalcitonin per algorithm  ENDOCRINE A:  H/O IDDM Hyperglycemia - Worse today.  P:  Accu-checks q4hrs Sensitive SSI Start Lantus 10u [place] qAM (home dose 20u) Hemoglobin A1c pending  NEUROLOGIC A:  Probable Partial Seizure - Not seen on EEG. Acute Encephalopathy - Multifactorial. Improving. Possible New Acute CVA - Symptom onset 7/5. H/O CVA  2015 - Mild right-sided weakness & slurred speech.  P:  RASS Goal:  0 to -1 Fentanyl IV prn Versed IV prn Neurology consulted & following AED:  Per Neurology holding on Keppra unless symptoms recur (500mg  bid) Seizure preactions MRI on Hold given concern by Radiology PT/OT Consult once extubated  FAMILY UPDATE:  Family updated 7/7 by Dr. Ashok Cordia. No family at bedside 7/8.  TODAY'S SUMMARY:  Patient admitted with a complex medical history suggesting focal partial seizure and possible stroke starting on 7/5. Patient obviously has a right-sided aspiration pneumonia contributing to his acute hypoxic respiratory failure. Likely can extubate today. Appreciate input & recommendations from both Neurology & Cardiology. Starting low dose Coreg &  continuing heparin gtt until tomorrow. Continuing to monitor for signs of seizure before starting Keppra.   I spent a total of 32 minutes of critical care time today caring for the patient and reviewing the patient's electronic medical record.  Sonia Baller Ashok Cordia, M.D. [facility] Pulmonary & Critical Care Pager:  [phone] After 3pm or if no response, call [phone] 7:08 AM 10/29/2015

## 2015-10-29 NOTE — Progress Notes (Signed)
Advanced Heart Failure Rounding Note  Primary Physician: [name], MD Primary Cardiologist: [place] Electrophysiologist: Caryl Comes   Reason for Consultation: ICD shocks  Subjective:    Admitted to ICU 10/27/15 after being found down at home with ? CVA findings. Intubated for airway protection with vomiting.  Had VT PTA with HV therapy.   Remains intubated. Wide awake on ventilator. FiO2 40%. Vitals stable. No further VT. Denies CP.   CVP 4 (checked personally). Creatinine 1.2   Objective:   Weight Range: 69.718 kg (153 lb 11.2 oz) Body mass index is 19.73 kg/(m^2).   Vital Signs:   Temp:  [98.4 F (36.9 C)-99.5 F (37.5 C)] 99.4 F (37.4 C) (07/08 0802) Pulse Rate:  [75-106] 85 (07/08 0806) Resp:  [19-28] 21 (07/08 0806) BP: (99-135)/(67-86) 116/70 mmHg (07/08 0806) SpO2:  [92 %-100 %] 100 % (07/08 0806) FiO2 (%):  [40 %] 40 % (07/08 0813) Weight:  [69.718 kg (153 lb 11.2 oz)] 69.718 kg (153 lb 11.2 oz) (07/08 0412)    Weight change: Filed Weights   10/27/15 1200 10/28/15 0500 10/29/15 0412  Weight: 67.2 kg (148 lb 2.4 oz) 69.7 kg (153 lb 10.6 oz) 69.718 kg (153 lb 11.2 oz)    Intake/Output:   Intake/Output Summary (Last 24 hours) at 10/29/15 0928 Last data filed at 10/29/15 0600  Gross per 24 hour  Intake 4060.66 ml  Output   1350 ml  Net 2710.66 ml     Physical Exam: General:  Intubated, responds to commands HEENT: Normal except ETT Neck: supple. JVP flat Carotids 2+ bilat; no bruits. No thyromegaly or nodule noted. Cor: PMI nondisplaced. Regular rate & rhythm. Regular rate and rhythm, 2/6 systolic murmur  Lungs: Mechanical breath sounds.  Abdomen: soft, NT, ND, no HSM. No bruits or masses. +BS  Extremities: no cyanosis, clubbing, rash, edema. Bilateral LEs cold below ankles.  Faint tibialis anterior Left, No DP pulse appreciated either side.  Neuro: alert & orientedx3, cranial nerves grossly intact. moves all 4 extremities w/o difficulty. Affect  pleasant  Telemetry:  Reviewed personally, NSR 70-80s, No VT  Labs: CBC  Recent Labs  10/27/15 0757  10/28/15 0420 10/29/15 0459  WBC 18.3*  < > 12.2* 12.0*  NEUTROABS 16.6*  --   --   --   HGB 12.0*  < > 10.3* 10.9*  HCT 38.0*  < > 31.7* 35.3*  MCV 88.2  < > 88.5 91.2  PLT 224  < > 151 168  < > = values in this interval not displayed. Basic Metabolic Panel  Recent Labs  10/28/15 0420  10/28/15 1736 10/29/15 0459  NA 143  --   --  141  K 3.9  --   --  4.0  CL 114*  --   --  114*  CO2 25  --   --  22  GLUCOSE 129*  --   --  301*  BUN 20  --   --  19  CREATININE 1.31*  --   --  1.21  CALCIUM 8.0*  --   --  7.8*  MG  --   < > 2.4 2.2  PHOS  --   < > 2.9 2.4*  < > = values in this interval not displayed. Liver Function Tests  Recent Labs  10/27/15 0757 10/27/15 1124  AST 35 60*  ALT 27 27  ALKPHOS 142* 125  BILITOT 0.8 1.3*  PROT 7.2 6.3*  ALBUMIN 3.2* 2.8*   No results for  input(s): LIPASE, AMYLASE in the last 72 hours. Cardiac Enzymes  Recent Labs  10/27/15 1124 10/27/15 1530 10/27/15 2047  TROPONINI 20.31* >65.00* >65.00*    BNP: BNP (last 3 results)  Recent Labs  04/11/15 0400 07/26/15 1707 10/27/15 1124  BNP 2568.2* 2061.3* 1318.0*    ProBNP (last 3 results) No results for input(s): PROBNP in the last 8760 hours.   D-Dimer No results for input(s): DDIMER in the last 72 hours. Hemoglobin A1C  Recent Labs  10/27/15 1124  HGBA1C 12.0*   Fasting Lipid Panel No results for input(s): CHOL, HDL, LDLCALC, TRIG, CHOLHDL, LDLDIRECT in the last 72 hours. Thyroid Function Tests No results for input(s): TSH, T4TOTAL, T3FREE, THYROIDAB in the last 72 hours.  Invalid input(s): FREET3  Other results:     Imaging/Studies:  Dg Chest Port 1 View  10/28/2015  CLINICAL DATA:  Respiratory failure.  ET tube placement EXAM: PORTABLE CHEST 1 VIEW COMPARISON:  10/27/2015 FINDINGS: Endotracheal tube with the tip 3.8 cm above the carina. Left  jugular central venous catheter with the tip projecting over the upper SVC. Patchy right upper lobe airspace disease mildly improved compared with the prior examination most consistent with pneumonia. Mild airspace disease in the right lower lung. No pleural effusion or pneumothorax. Stable cardiomediastinal silhouette. Single lead AICD. Nasogastric tube coursing below the diaphragm. The osseous structures are unremarkable. IMPRESSION: 1. Support lines and tubing in satisfactory position. 2. Patchy right upper lobe airspace disease mildly improved compared with the prior examination consistent with persistent pneumonia. Mild airspace disease in the right lower lung likely reflecting pneumonia. Electronically Signed   By: Kathreen Devoid   On: 10/28/2015 10:10   Dg Chest Port 1 View  10/27/2015  CLINICAL DATA:  Central line placement EXAM: PORTABLE CHEST 1 VIEW COMPARISON:  10/27/2015 FINDINGS: Cardiomediastinal silhouette is stable. Worsening patchy pneumonia right upper lobe and right lower lobe. There is endotracheal tube in place with tip 5 cm above the carina. NG tube is noted with tip in proximal stomach just below the GE junction level. The NG tube should be advanced further into the stomach. There is left IJ central line with tip in upper SVC. No pneumothorax. IMPRESSION: Worsening patchy pneumonia right upper lobe and right lower lobe. There is endotracheal tube in place with tip 5 cm above the carina. NG tube is noted with tip in proximal stomach just below the GE junction level. The NG tube should be advanced further into the stomach. There is left IJ central line with tip in upper SVC. No pneumothorax. Electronically Signed   By: Lahoma Crocker M.D.   On: 10/27/2015 14:54    Latest Echo  Latest Cath   Medications:     Scheduled Medications: . ampicillin-sulbactam (UNASYN) IV  1.5 g Intravenous Q6H  . antiseptic oral rinse  7 mL Mouth Rinse QID  . aspirin  325 mg Per Tube Daily  . chlorhexidine  gluconate (SAGE KIT)  15 mL Mouth Rinse BID  . famotidine (PEPCID) IV  20 mg Intravenous Q24H  . insulin aspart  0-9 Units Subcutaneous Q4H  . midazolam  4 mg Intravenous Once  . rosuvastatin  40 mg Per Tube q1800  . sodium chloride  1,000 mL Intravenous Once    Infusions: . sodium chloride 100 mL/hr at 10/29/15 0630  . amiodarone 30 mg/hr (10/29/15 0555)  . feeding supplement (VITAL AF 1.2 CAL) 1,000 mL (10/29/15 0420)  . heparin 1,550 Units/hr (10/29/15 0545)    PRN Medications: acetone (  urine) test, fentaNYL (SUBLIMAZE) injection, midazolam, midazolam   Assessment   1. VT 2. NSTEMI 3. Chronic systolic HF 4. CAD 5. ?CVA  6. Sepsis 7. Acute respiratory failure requiring intubation  Plan    Continues to improve. Neuro status now normal. Hopefully can extubate today. CVP low.   He has had a large NSTEMI. No further VT on IV amio. Remains on heparin. Will start low dose b-blocker.. Continue IV amio and statin. Will need to consider cath next week however last cath in 11/2013 demonstrated triple vessel coronary artery disease. With 99%p LAD (s/p previous anterior MI), LCX 95% and 100% non-dominant RCA. There was consideration of high risk revascularization of the circumflex lesion. However, stress viability study which demonstrated no evidence of viability. So options may be limited. Will need to review prior films with interventional team prior to repeat cath.   Remains on heparin drip and amio drip with VT and NSTEMI. Goal K > 4.0 and Mg > 2.0. Can likely stop heparin tomorrow.    Length of Stay: 2   Glori Bickers MD  10/29/2015, 9:28 AM  Advanced Heart Failure Team Pager [phone] (M-F; 7a - 4p)  Please contact [facility] for night-coverage after hours (4p -7a ) and weekends on [email or web address]

## 2015-10-29 NOTE — Procedures (Signed)
Extubation Procedure Note  Patient Details:   Name: TAHA WALDBILLIG DOB: 10-24-1945 MRN: [id number]   Airway Documentation:     Evaluation  O2 sats: stable throughout Complications: No apparent complications Patient did tolerate procedure well. Bilateral Breath Sounds: Diminished, Rhonchi   Yes   Pt extubated to 4L N/C, no stridor noted.  RN at bedside.  Donnetta Hail 10/29/2015, 12:02 PM

## 2015-10-29 NOTE — Progress Notes (Signed)
  Echocardiogram 2D Echocardiogram has been performed.  Darlina Sicilian M 10/29/2015, 10:35 AM

## 2015-10-30 DIAGNOSIS — N390 Urinary tract infection, site not specified: Secondary | ICD-10-CM

## 2015-10-30 LAB — CBC
HEMATOCRIT: 36.4 % — AB (ref 39.0–52.0)
HEMOGLOBIN: 11.5 g/dL — AB (ref 13.0–17.0)
MCH: 28.8 pg (ref 26.0–34.0)
MCHC: 31.6 g/dL (ref 30.0–36.0)
MCV: 91 fL (ref 78.0–100.0)
Platelets: 179 10*3/uL (ref 150–400)
RBC: 4 MIL/uL — AB (ref 4.22–5.81)
RDW: 16 % — ABNORMAL HIGH (ref 11.5–15.5)
WBC: 14.6 10*3/uL — AB (ref 4.0–10.5)

## 2015-10-30 LAB — GLUCOSE, CAPILLARY
GLUCOSE-CAPILLARY: 109 mg/dL — AB (ref 65–99)
GLUCOSE-CAPILLARY: 126 mg/dL — AB (ref 65–99)
GLUCOSE-CAPILLARY: 121 mg/dL — AB (ref 65–99)
Glucose-Capillary: 106 mg/dL — ABNORMAL HIGH (ref 65–99)
Glucose-Capillary: 107 mg/dL — ABNORMAL HIGH (ref 65–99)
Glucose-Capillary: 168 mg/dL — ABNORMAL HIGH (ref 65–99)

## 2015-10-30 LAB — BASIC METABOLIC PANEL
ANION GAP: 7 (ref 5–15)
BUN: 16 mg/dL (ref 6–20)
CHLORIDE: 116 mmol/L — AB (ref 101–111)
CO2: 21 mmol/L — AB (ref 22–32)
Calcium: 8.1 mg/dL — ABNORMAL LOW (ref 8.9–10.3)
Creatinine, Ser: 1.2 mg/dL (ref 0.61–1.24)
GFR calc Af Amer: >60 mL/min (ref >60)
GFR, EST NON AFRICAN AMERICAN: 60 mL/min — AB (ref >60)
GLUCOSE: 122 mg/dL — AB (ref 65–99)
POTASSIUM: 3.8 mmol/L (ref 3.5–5.1)
Sodium: 144 mmol/L (ref 135–145)

## 2015-10-30 LAB — HEPARIN LEVEL (UNFRACTIONATED): Heparin Unfractionated: 0.37 IU/mL (ref 0.30–0.70)

## 2015-10-30 MED ORDER — TAMSULOSIN HCL 0.4 MG PO CAPS
0.4000 mg | ORAL_CAPSULE | Freq: Every day | ORAL | Status: DC
Start: 2015-10-30 — End: 2015-11-06
  Administered 2015-10-30 – 2015-11-06 (×7): 0.4 mg via ORAL
  Filled 2015-10-30 (×7): qty 1

## 2015-10-30 MED ORDER — RESOURCE THICKENUP CLEAR PO POWD
ORAL | Status: DC | PRN
  Administered 2015-10-31: 05:00:00 via ORAL
  Filled 2015-10-30 (×2): qty 125

## 2015-10-30 MED ORDER — MIRTAZAPINE 15 MG PO TABS: 15.0000 mg | ORAL_TABLET | Freq: Every day | ORAL | Status: DC

## 2015-10-30 NOTE — Progress Notes (Signed)
SUBJECTIVE:  No complaints  OBJECTIVE:   Vitals:   Filed Vitals:   10/30/15 0700 10/30/15 0800 10/30/15 0821 10/30/15 1237  BP: 131/82 128/82    Pulse: 91 89    Temp:   98.7 F (37.1 C) 98.6 F (37 C)  TempSrc:   Oral Oral  Resp:      Height:      Weight:      SpO2: 99%      I&O's:   Intake/Output Summary (Last 24 hours) at 10/30/15 1254 Last data filed at 10/30/15 0700  Gross per 24 hour  Intake 2711.8 ml  Output   1340 ml  Net 1371.8 ml   TELEMETRY: Reviewed telemetry pt in NSR:     PHYSICAL EXAM General: Well developed, well nourished, in no acute distress Head: Eyes PERRLA, No xanthomas.   Normal cephalic and atramatic  Lungs:   Clear bilaterally to auscultation and percussion. Heart:   HRRR S1 S2 Pulses are 2+ & equal. Abdomen: Bowel sounds are positive, abdomen soft and non-tender without masses Msk:  Back normal, normal gait. Normal strength and tone for age. Extremities:   No clubbing, cyanosis or edema.  DP +1 Neuro: Alert and oriented X 3. Psych:  Good affect, responds appropriately   LABS: Basic Metabolic Panel:  Recent Labs  10/29/15 0459 10/29/15 1601 10/30/15 0500  NA 141  --  144  K 4.0  --  3.8  CL 114*  --  116*  CO2 22  --  21*  GLUCOSE 301*  --  122*  BUN 19  --  16  CREATININE 1.21  --  1.20  CALCIUM 7.8*  --  8.1*  MG 2.2 2.1  --   PHOS 2.4* 2.1*  --    Liver Function Tests: No results for input(s): AST, ALT, ALKPHOS, BILITOT, PROT, ALBUMIN in the last 72 hours. No results for input(s): LIPASE, AMYLASE in the last 72 hours. CBC:  Recent Labs  10/29/15 0459 10/30/15 0500  WBC 12.0* 14.6*  HGB 10.9* 11.5*  HCT 35.3* 36.4*  MCV 91.2 91.0  PLT 168 179   Cardiac Enzymes:  Recent Labs  10/27/15 1530 10/27/15 2047  TROPONINI >65.00* >65.00*   BNP: Invalid input(s): POCBNP D-Dimer: No results for input(s): DDIMER in the last 72 hours. Hemoglobin A1C: No results for input(s): HGBA1C in the last 72 hours. Fasting  Lipid Panel: No results for input(s): CHOL, HDL, LDLCALC, TRIG, CHOLHDL, LDLDIRECT in the last 72 hours. Thyroid Function Tests: No results for input(s): TSH, T4TOTAL, T3FREE, THYROIDAB in the last 72 hours.  Invalid input(s): FREET3 Anemia Panel: No results for input(s): VITAMINB12, FOLATE, FERRITIN, TIBC, IRON, RETICCTPCT in the last 72 hours. Coag Panel:   Lab Results  Component Value Date   INR 1.20 10/27/2015   INR 1.10 10/27/2015   INR 0.99 12/09/2013    RADIOLOGY: Ct Head Wo Contrast  10/27/2015  CLINICAL DATA:  Confusion.  Altered mental status. EXAM: CT HEAD WITHOUT CONTRAST TECHNIQUE: Contiguous axial images were obtained from the base of the skull through the vertex without intravenous contrast. COMPARISON:  CT head 04/10/2015 FINDINGS: Moderate atrophy is unchanged. Ventricular enlargement consistent with atrophy. Mild chronic white matter hypodensity bilaterally is stable Negative for acute infarct. Negative for acute hemorrhage or mass. No shift of the midline structures. Negative calvarium. Gas in the soft tissues round skullbase likely is related to IV placement. IMPRESSION: Atrophy with chronic microvascular ischemia.  No acute abnormality. Electronically Signed   By:  Franchot Gallo M.D.   On: 10/27/2015 08:50   Dg Chest Port 1 View  10/28/2015  CLINICAL DATA:  Respiratory failure.  ET tube placement EXAM: PORTABLE CHEST 1 VIEW COMPARISON:  10/27/2015 FINDINGS: Endotracheal tube with the tip 3.8 cm above the carina. Left jugular central venous catheter with the tip projecting over the upper SVC. Patchy right upper lobe airspace disease mildly improved compared with the prior examination most consistent with pneumonia. Mild airspace disease in the right lower lung. No pleural effusion or pneumothorax. Stable cardiomediastinal silhouette. Single lead AICD. Nasogastric tube coursing below the diaphragm. The osseous structures are unremarkable. IMPRESSION: 1. Support lines and tubing  in satisfactory position. 2. Patchy right upper lobe airspace disease mildly improved compared with the prior examination consistent with persistent pneumonia. Mild airspace disease in the right lower lung likely reflecting pneumonia. Electronically Signed   By: Kathreen Devoid   On: 10/28/2015 10:10   Dg Chest Port 1 View  10/27/2015  CLINICAL DATA:  Central line placement EXAM: PORTABLE CHEST 1 VIEW COMPARISON:  10/27/2015 FINDINGS: Cardiomediastinal silhouette is stable. Worsening patchy pneumonia right upper lobe and right lower lobe. There is endotracheal tube in place with tip 5 cm above the carina. NG tube is noted with tip in proximal stomach just below the GE junction level. The NG tube should be advanced further into the stomach. There is left IJ central line with tip in upper SVC. No pneumothorax. IMPRESSION: Worsening patchy pneumonia right upper lobe and right lower lobe. There is endotracheal tube in place with tip 5 cm above the carina. NG tube is noted with tip in proximal stomach just below the GE junction level. The NG tube should be advanced further into the stomach. There is left IJ central line with tip in upper SVC. No pneumothorax. Electronically Signed   By: Lahoma Crocker M.D.   On: 10/27/2015 14:54   Dg Chest Portable 1 View  10/27/2015  CLINICAL DATA:  Possible aspiration, nor is the breath sounds, tachycardia; history of CVA, coronary artery disease, and diabetes,; current mental status change. EXAM: PORTABLE CHEST 1 VIEW COMPARISON:  PA and lateral chest x-ray of July 27, 2015 FINDINGS: There is confluent alveolar opacity in the right upper lobe. The interstitial markings in the mid and lower right lung are coarse. There is subtle increased density approximately 2 cm above the dome of the hemidiaphragm that is new. The heart is top-normal in size. The pulmonary vascularity is not engorged. There is a permanent pacemaker defibrillator in stable position. The observed bony thorax exhibits no  acute abnormality. IMPRESSION: Right upper lobe and left lower lobe pneumonia. Low-grade compensated CHF. Followup PA and lateral chest X-ray is recommended in 3-4 weeks following trial of antibiotic therapy to ensure resolution and exclude underlying malignancy. Electronically Signed   By: David  [name] M.D.   On: 10/27/2015 08:21    Assessment   1. VT - s/p AICD discharge 2. NSTEMI - peak Trop > 65 3. Chronic systolic HF with ischemic DCM EF 15% 4. CAD 5. ?CVA  6. Sepsis 7. Acute respiratory failure secondary to aspiration PNA requiring intubation  Plan   Continues to improve. Neuro status now normal and now extubated.  He has had a large NSTEMI. No further VT on IV amio. Remains on heparin. Continue IV amio, low dose BB and statin. Will need to consider cath next week however last cath in 11/2013 demonstrated triple vessel coronary artery disease with 99%p LAD (s/p previous anterior  MI), LCX 95% and 100% non-dominant RCA. There was consideration of high risk revascularization of the circumflex lesion, however, stress viability study demonstrated no evidence of viability so options may be limited. Will need to review prior films with interventional team prior to repeat cath.   Remains on heparin drip and amio drip with VT and NSTEMI. Goal K > 4.0 and Mg > 2.0. OK to stop Heparin gtt today and change to SQ for DVT prophylaxis.        Fransico Him, MD  10/30/2015  12:54 PM

## 2015-10-30 NOTE — Progress Notes (Signed)
ANTICOAGULATION CONSULT NOTE - Follow Up Consult ANTIBIOTIC CONSULT NOTE - Follow Up Consult  Pharmacy Consult for Heparin and Unasyn Indication: chest pain/ACS and UTI/aspiration pneumonia  No Known Allergies  Patient Measurements: Height: 6\' 2"  (188 cm) Weight: 160 lb 4.4 oz (72.7 kg) IBW/kg (Calculated) : 82.2  Vital Signs: Temp: 98.7 F (37.1 C) (07/09 0821) Temp Source: Oral (07/09 0821) BP: 128/82 mmHg (07/09 0800) Pulse Rate: 89 (07/09 0800)  Labs:  Recent Labs  10/27/15 1530  10/27/15 2047  10/28/15 0420  10/29/15 0459 10/29/15 1400 10/30/15 0500  HGB  --   --   --   < > 10.3*  --  10.9*  --  11.5*  HCT  --   --   --   --  31.7*  --  35.3*  --  36.4*  PLT  --   --   --   --  151  --  168  --  179  HEPARINUNFRC  --   < > 0.23*  --  <0.10*  < > 0.17* 0.33 0.37  CREATININE  --   --   --   --  1.31*  --  1.21  --  1.20  TROPONINI >65.00*  --  >65.00*  --   --   --   --   --   --   < > = values in this interval not displayed.  Estimated Creatinine Clearance: 59.7 mL/min (by C-G formula based on Cr of 1.2).  Medications: Heparin @ 1550 units/hr  Assessment: 70yom continues on heparin for NSTEMI. Heparin level is therapeutic at 0.37. Noted plan for possible cath. CBC is stable. No bleeding.  Also continues on day #4/7 Unasyn for Klebsiella UTI and aspiration pneumonia. Renal function improved and stable.  7/6 Unasyn >>  7/6 vanc x1 7/6 zosyn x1  7/6 BCx x4 >> ngtd 7/7 resp >> rare yeast 7/6 urine >> klebsiella (S- unasyn)  Goal of Therapy:  Heparin level 0.3-0.7 units/ml Monitor platelets by anticoagulation protocol: Yes   Plan: 1) Continue heparin at 1550 units/hr 2) Daily heparin level and CBC 3) Continue unasyn 1.5g IV q6 4) Follow up stop date for Unasyn after 7 days

## 2015-10-30 NOTE — Progress Notes (Signed)
Neurology Progress Note  Subjective: The patient is without complaints for me this morning. He denies any headache, weakness, numbness, or other neurologic issue. His RN reports that he was more confused overnight. He has no had any further seizure activity.   Current Meds:   Current facility-administered medications:  .  0.9 %  sodium chloride infusion, , Intravenous, Continuous, William S Minor, NP, Last Rate: 100 mL/hr at 10/29/15 0630 .  acetone (urine) test strip 1 strip, 1 strip, Does not apply, PRN, Grace Bushy Minor, NP .  amiodarone (NEXTERONE PREMIX) 360-4.14 MG/200ML-% (1.8 mg/mL) IV infusion, 30 mg/hr, Intravenous, Continuous, Patsey Berthold, NP, Last Rate: 16.7 mL/hr at 10/30/15 0639, 30 mg/hr at 10/30/15 0639 .  ampicillin-sulbactam (UNASYN) 1.5 g in sodium chloride 0.9 % 50 mL IVPB, 1.5 g, Intravenous, Q6H, Meagan A Decker, RPH, 1.5 g at 10/30/15 0440 .  antiseptic oral rinse solution (CORINZ), 7 mL, Mouth Rinse, QID, Javier Glazier, MD, 7 mL at 10/30/15 0440 .  aspirin tablet 325 mg, 325 mg, Per Tube, Daily, Rahul P Desai, PA-C, 325 mg at 10/29/15 1000 .  carvedilol (COREG) tablet 3.125 mg, 3.125 mg, Oral, BID WC, Jolaine Artist, MD, 3.125 mg at 10/29/15 1658 .  chlorhexidine gluconate (SAGE KIT) (PERIDEX) 0.12 % solution 15 mL, 15 mL, Mouth Rinse, BID, Javier Glazier, MD, 15 mL at 10/29/15 2106 .  famotidine (PEPCID) IVPB 20 mg premix, 20 mg, Intravenous, Q24H, Tanda Rockers, MD, 20 mg at 10/29/15 1659 .  feeding supplement (VITAL AF 1.2 CAL) liquid 1,000 mL, 1,000 mL, Per Tube, Continuous, Javier Glazier, MD, Last Rate: 65 mL/hr at 10/29/15 0420, 1,000 mL at 10/29/15 0420 .  fentaNYL (SUBLIMAZE) injection 50 mcg, 50 mcg, Intravenous, Q2H PRN, Grace Bushy Minor, NP, 50 mcg at 10/27/15 1701 .  heparin ADULT infusion 100 units/mL (25000 units/271m sodium chloride 0.45%), 1,550 Units/hr, Intravenous, Continuous, DJuanito Doom MD, Last Rate: 15.5 mL/hr at 10/29/15 2307,  1,550 Units/hr at 10/29/15 2307 .  insulin aspart (novoLOG) injection 0-9 Units, 0-9 Units, Subcutaneous, Q4H, JJavier Glazier MD, 1 Units at 10/29/15 2104 .  insulin glargine (LANTUS) injection 10 Units, 10 Units, Subcutaneous, Daily, JJavier Glazier MD, 10 Units at 10/29/15 1000 .  midazolam (VERSED) injection 1 mg, 1 mg, Intravenous, Q15 min PRN, WGrace BushyMinor, NP, 1 mg at 10/27/15 1417 .  midazolam (VERSED) injection 1 mg, 1 mg, Intravenous, Q2H PRN, WGrace BushyMinor, NP, 1 mg at 10/29/15 0203 .  midazolam (VERSED) injection 4 mg, 4 mg, Intravenous, Once, DJuanito Doom MD, 4 mg at 10/27/15 1018 .  rosuvastatin (CRESTOR) tablet 40 mg, 40 mg, Per Tube, q1800, JJavier Glazier MD, 40 mg at 10/29/15 1658 .  sodium chloride 0.9 % bolus 1,000 mL, 1,000 mL, Intravenous, Once, WGrace BushyMinor, NP, 1,000 mL at 10/27/15 1038  Objective:  Temp:  [98.5 F (36.9 C)-99 F (37.2 C)] 98.5 F (36.9 C) (07/09 0410) Pulse Rate:  [70-103] 91 (07/09 0700) Resp:  [20-21] 20 (07/08 1139) BP: (104-140)/(68-97) 131/82 mmHg (07/09 0700) SpO2:  [96 %-100 %] 99 % (07/09 0700) FiO2 (%):  [40 %] 40 % (07/08 1200) Weight:  [72.7 kg (160 lb 4.4 oz)] 72.7 kg (160 lb 4.4 oz) (07/09 0443)  General: WDWN Caucasian man in NAD. Alert, ortiented to self, month, year, hospital. Speech is clear with mild dysarthria. Affect is bright. Comportment is normal.  HEENT: Neck is supple without lymphadenopathy. Mucous membranes are moist  and the oropharynx is clear. Sclerae are anicteric. There is no conjunctival injection.  CV: Regular, no murmur. Carotid pulses are 2+ and symmetric with no bruits. Distal pulses 2+ and symmetric.  Lungs: CTAB on anterior exam.  Extremities: No C/C/E. Neuro: MS: As noted above. No aphasia.  CN: Pupils are equal and reactive from 3-->2 mm bilaterally. EOMI with breakup of smooth pursuits in all directions, no nystagmus. Facial sensation is intact to light touch. Face is symmetric at  rest with normal strength and mobility. Hearing is intact to conversational voice. Voice is normal in tone and quality. Palate elevates symmetrically. Uvula is midline. Bilateral SCM and trapezii are 5/5. Tongue is midline with normal bulk and mobility.  Motor: Normal bulk, tone, and strength throughout. However, he has some difficulty with initiation of movement with decreased dexterity in the right hand.  No pronator drift. No tremor or other abnormal movements are observed.  Sensation: Intact to light touch and pinprick. DTRs: 3+, slightly more brisk on the right side. Toes are downgoing bilaterally.  Coordination: Finger-to-nose is slow but without dysmetria.   Labs: Lab Results  Component Value Date   WBC 14.6* 10/30/2015   HGB 11.5* 10/30/2015   HCT 36.4* 10/30/2015   PLT 179 10/30/2015   GLUCOSE 122* 10/30/2015   CHOL 120 03/09/2014   TRIG 123 03/09/2014   HDL 39* 03/09/2014   LDLDIRECT 100.5 02/08/2012   LDLCALC 56 03/09/2014   ALT 27 10/27/2015   AST 60* 10/27/2015   NA 144 10/30/2015   K 3.8 10/30/2015   CL 116* 10/30/2015   CREATININE 1.20 10/30/2015   BUN 16 10/30/2015   CO2 21* 10/30/2015   TSH 1.785 07/26/2015   PSA 4.89* 08/01/2015   INR 1.20 10/27/2015   HGBA1C 12.0* 10/27/2015   MICROALBUR 43.3* 02/08/2012   CBC Latest Ref Rng 10/30/2015 10/29/2015 10/28/2015  WBC 4.0 - 10.5 K/uL 14.6(H) 12.0(H) 12.2(H)  Hemoglobin 13.0 - 17.0 g/dL 11.5(L) 10.9(L) 10.3(L)  Hematocrit 39.0 - 52.0 % 36.4(L) 35.3(L) 31.7(L)  Platelets 150 - 400 K/uL 179 168 151    Lab Results  Component Value Date   HGBA1C 12.0* 10/27/2015   Lab Results  Component Value Date   ALT 27 10/27/2015   AST 60* 10/27/2015   ALKPHOS 125 10/27/2015   BILITOT 1.3* 10/27/2015   A/P:   1. Probable partial seizure: History suggests simple partial seizures involving the right arm. He has not had any witnessed seizures since his admission. EEG did not show any ongoing seizures. MRI cannot be obtained as  his AICD is no compatible.Continue to hold AED. If seizures recur, would start Keppra 500 mg BID. Seizure precautions.   2. Encephalopathy: This is likely multifactorial in etiology with contributions from PNA, MI, hyperglycemia, possibly seizure. This has improved overall, though it sounds as if he has a mild ICU delirium at this point. Continue to optimize metabolic status as you are. Minimize sedation as much as possible, specifically avoiding benzos, opiates, and meds with strong anticholinergic properties.   3. Cerebrovascular disease: He has chronic small vessel disease with history of remote stroke as well. His known cerebrovascular risk factors include HTN, DM, CAD, OSA with CPAP non-compliance, prior stroke, age. He has no new focal deficit to suggest a new stroke but a small event cannot be excluded particularly given his RUE apraxia. MRI cannot be obtained as AICD is not compatible. Continue with aggressive risk factor modification. Continue aspirin and statin.   4. RUE apraxia: Recommend  OT to eval and treat when able.   Melba Coon, MD [facility]

## 2015-10-30 NOTE — Progress Notes (Signed)
PULMONARY / CRITICAL CARE MEDICINE   Name: [name] [name] MRN: [id number] DOB: 1945-09-03    ADMISSION DATE:  10/27/2015 CONSULTATION DATE:  10/27/15  REFERRING MD:  EDP  CHIEF COMPLAINT:  AMS  HISTORY OF PRESENT ILLNESS:   70 yo M with plethora of health issues, EF 15%, IACD, CVA rt side weakness 2015, IDDM who presents to [facility] ED with 24 hours of increased confusion, rt arm tremor, agitation. He vomited and most likely aspirated 7/6 and will require intubation for airway protection. Neuro and Cards will see patient, note he had VT in ER and IACD fired. We will admit to ICU, treat with abx,do nuero and cardiology work ups. He is a full code per wife and family.  SUBJECTIVE: Patient extubated yesterday. Patient denies any chest pain, pressure, or tightness. Denies any dyspnea or cough. Has weaned to room air.  REVIEW OF SYSTEMS:  No nausea, vomiting, or abdominal pain. No headache or vision changes. No subjective fever, chills, or sweats.  VITAL SIGNS: BP 131/82 mmHg  Pulse 91  Temp(Src) 98.7 F (37.1 C) (Oral)  Resp 20  Ht 6\' 2"  (1.88 m)  Wt 160 lb 4.4 oz (72.7 kg)  BMI 20.57 kg/m2  SpO2 99%  HEMODYNAMICS:    VENTILATOR SETTINGS: Vent Mode:  [-] CPAP;PSV FiO2 (%):  [40 %] 40 % PEEP:  [0 cmH20] 0 cmH20  INTAKE / OUTPUT: I/O last 3 completed shifts: In: 6126.7 [I.V.:4736.7; NG/GT:1040; IV Piggyback:350] Out: 2075 [Urine:2075]  PHYSICAL EXAMINATION: General: Awake. Alert. Family at bedside. No distress. Neuro: No meningismus. Reflexes symmetric. Some questionable expressive aphasia. Cranial nerves grossly intact. Cardiovascular: Regular rate. No edema. no appreciable JVD.  Lungs: clear on auscultation bilaterally. Normal work of breathing on room air.  Abdomen: Soft. Nondistended. Normal bowel sounds.nontender.  Musculoskeletal: No joint deformity or effusion.  strength grossly 5/5 and symmetric.  Skin: Warm & dry. No rash on exposed  skin.  LABS:  BMET  Recent Labs Lab 10/28/15 0420 10/29/15 0459 10/30/15 0500  NA 143 141 144  K 3.9 4.0 3.8  CL 114* 114* 116*  CO2 25 22 21*  BUN 20 19 16   CREATININE 1.31* 1.21 1.20  GLUCOSE 129* 301* 122*    Electrolytes  Recent Labs Lab 10/28/15 0420  10/28/15 1736 10/29/15 0459 10/29/15 1601 10/30/15 0500  CALCIUM 8.0*  --   --  7.8*  --  8.1*  MG  --   < > 2.4 2.2 2.1  --   PHOS  --   < > 2.9 2.4* 2.1*  --   < > = values in this interval not displayed.  CBC  Recent Labs Lab 10/28/15 0420 10/29/15 0459 10/30/15 0500  WBC 12.2* 12.0* 14.6*  HGB 10.3* 10.9* 11.5*  HCT 31.7* 35.3* 36.4*  PLT 151 168 179    Coag's  Recent Labs Lab 10/27/15 0757 10/27/15 1124  APTT 26 27  INR 1.10 1.20    Sepsis Markers  Recent Labs Lab 10/27/15 0803 10/27/15 1124  LATICACIDVEN 6.11* 2.8*  PROCALCITON  --  0.33    ABG  Recent Labs Lab 10/27/15 1141 10/27/15 1811 10/28/15 0259  PHART 7.509* 7.403 7.409  PCO2ART 32.0* 39.4 39.2  PO2ART 430.0* 134.0* 144*    Liver Enzymes  Recent Labs Lab 10/27/15 0757 10/27/15 1124  AST 35 60*  ALT 27 27  ALKPHOS 142* 125  BILITOT 0.8 1.3*  ALBUMIN 3.2* 2.8*    Cardiac Enzymes  Recent Labs Lab 10/27/15 1124 10/27/15 1530  10/27/15 2047  TROPONINI 20.31* >65.00* >65.00*    Glucose  Recent Labs Lab 10/29/15 0803 10/29/15 1226 10/29/15 1623 10/29/15 2008 10/29/15 2356 10/30/15 0408  GLUCAP 312* 322* 195* 122* 106* 107*    Imaging No results found.  STUDIES:  EEG 7/6: severe diffuse generalized slowing, non-specific but consistent with a severe encephalopathic process, no evidence of seizure activity. CT Head W/O 7/6: Atrophy, no acute abnormalities Port CXR 7/6: worsening patchy pneumonia in the right upper lobe and right lower lobe. TTE 7/8:  LV severely dilated. EF 15-20%. Diffuse hypokinesis w/ akinesis of apical myocardium present on 03/2015 TTE. Grade 2 diastolic dysfunction. No  AS or AR. RV normal in size & function. PASP 80mmHg.  MICROBIOLOGY: MRSA PCR (7/6): Negative Urine Ctx (7/6):  Klebsiella oxytoca  Blood Ctx x2 (7/6) >> Tracheal Asp Ctx (7/7)>> Urine Strep Ag (7/6):  Negative Urine Legionella Ag (7/7)>>  ANTIBIOTICS: Unasyn 7/6>>>  SIGNIFICANT EVENTS: 7/6 - V tach in the ED, AICD fired  LINES/TUBES: OETT 7.5 7/6 - 7/8 OGT 7/6 - 7/8 L IJ CVL 7/6>>> FOLEY 7/6>>> PIV x1  ASSESSMENT / PLAN:  PULMONARY A: Acute Hypoxic Respiratory Failure - Resolved. Secondary to aspiration pneumonia. Improving. S/P extubation 7/8. Aspiration PNA  P:  Pulmonary Toilette with IS  CARDIOVASCULAR A:  NSTEMI- Peak Troponin I > 65 H/O Ischemic Cardiomyopathy w/ Systolic CHF- Last TTE w/ EF 15%, diffuse septal hypokinesis and apical akinesis along w/ cavity size severely dilated. IACD - Fired in ED 7/6. interrogated by Cards 7/6. Ventricular Tachycardia - IACD fired in ED 7/6. H/O Hyperlipidemia  P:  Cardiology following & appreciate recommendations Heparin gtt per pharmacy protocol & can likely stop today per Cardiology Amiodarone gtt ASA 325mg  VT daily Crestor 40mg  VT daily Coreg 3.125mg  bid per Cardiology Continuous telemetry monitoring Vitals per unit protocol Plan for LHC as he recovers Holding home Hydralazine, Imdur, [place], Spironolactone, Digoxin   NEUROLOGIC A:  Probable Partial Seizure - Not seen on EEG. Acute Encephalopathy - Multifactorial. Improving. Possible New Acute CVA - Symptom onset 7/5. H/O CVA 2015 - Mild right-sided weakness & slurred speech.  P:  Neurology consulted & following AED:  Per Neurology holding on Keppra unless symptoms recur (500mg  bid) Seizure preactions MRI on Hold given concern by Radiology PT/OT Consulted  RENAL/GU A:  NAGMA - Likely secondary to hyperchloremia that is mild. Acute on CKD III- Resolved. Baseline Cr 1.7-2.2. Hyperkalemia- Resolved. Lactic acidosis- Resolved.  P:   Trending UOP  D/C Foley KVO IVF Monitor daily electrolytes and renal function Replacing electrolytes as indicated Restart Flomax daily  GASTROINTESTINAL A:  Nutrition Probable Aspiration  P:  Nothing by mouth D/C Pepcid Speech Eval ordered  HEMATOLOGIC A:  Leukocytosis- Mild.  Anemia - Mild. No signs of active bleeding.  P:  Trending cell counts daily w/ CBC Heparin gtt per pharmacy protocol  INFECTIOUS A:  Aspiration Pneumonia - Right sided. Klebsiella UTI  P:  Unasyn 1.5g IV q6hrs Day #4/7 Cultures are pending Urinary Legionella pending Trend Procalcitonin per algorithm  ENDOCRINE A:  H/O IDDM - A1c 12.0. Hyperglycemia - Worse today.  P:  Accu-checks q4hrs Sensitive SSI Lantus 10u [place] qAM (home dose 20u)  FAMILY UPDATE:  Family updated 7/9 by Dr. Ashok Cordia.  TODAY'S SUMMARY:  Patient admitted with a complex medical history suggesting focal partial seizure and possible stroke starting on 7/5.  patient continuing to clinically improve. Respiratory status is stable post extubation with resolution of oxygen requirement. Continuing treatment of underlying  Klebsiella UTI with Unasyn to also cover for aspiration pneumonia. Continuing sliding scale insulin and Lantus as ordered.  patient will remain nothing by mouth until evaluation by speech pathology. I have also consulted PT & OT. Patient continuing on heparin & amiodarone infusions with cardiology following. Given patient's continued clinical improvement I'm transitioning the patient to stepdown unit.  [facility] to assume care & PCCM off as of 7/9. [facility] reports they do not follow Dr. Erick Blinks patients.   Sonia Baller Ashok Cordia, M.D. [facility]monary [facility] Pager:  [phone] After 3pm or if no response, call [phone] 8:39 AM 10/30/2015
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
## 2015-10-31 DIAGNOSIS — I5023 Acute on chronic systolic (congestive) heart failure: Secondary | ICD-10-CM

## 2015-10-31 LAB — CULTURE, RESPIRATORY W GRAM STAIN

## 2015-10-31 LAB — GLUCOSE, CAPILLARY
GLUCOSE-CAPILLARY: 140 mg/dL — AB (ref 65–99)
GLUCOSE-CAPILLARY: 157 mg/dL — AB (ref 65–99)
GLUCOSE-CAPILLARY: 165 mg/dL — AB (ref 65–99)
GLUCOSE-CAPILLARY: 206 mg/dL — AB (ref 65–99)
GLUCOSE-CAPILLARY: 212 mg/dL — AB (ref 65–99)
GLUCOSE-CAPILLARY: 247 mg/dL — AB (ref 65–99)

## 2015-10-31 LAB — CBC
HCT: 35.2 % — ABNORMAL LOW (ref 39.0–52.0)
HEMOGLOBIN: 11.5 g/dL — AB (ref 13.0–17.0)
MCH: 28.6 pg (ref 26.0–34.0)
MCHC: 32.7 g/dL (ref 30.0–36.0)
MCV: 87.6 fL (ref 78.0–100.0)
Platelets: 205 10*3/uL (ref 150–400)
RBC: 4.02 MIL/uL — ABNORMAL LOW (ref 4.22–5.81)
RDW: 15.7 % — AB (ref 11.5–15.5)
WBC: 11.9 10*3/uL — AB (ref 4.0–10.5)

## 2015-10-31 LAB — CULTURE, RESPIRATORY: SPECIAL REQUESTS: NORMAL

## 2015-10-31 LAB — CARBOXYHEMOGLOBIN
Carboxyhemoglobin: 1.9 % — ABNORMAL HIGH (ref 0.5–1.5)
METHEMOGLOBIN: 0.8 % (ref 0.0–1.5)
O2 Saturation: 81.2 %
Total hemoglobin: 10.8 g/dL — ABNORMAL LOW (ref 13.5–18.0)

## 2015-10-31 LAB — BASIC METABOLIC PANEL
ANION GAP: 7 (ref 5–15)
BUN: 18 mg/dL (ref 6–20)
CALCIUM: 8.3 mg/dL — AB (ref 8.9–10.3)
CO2: 20 mmol/L — ABNORMAL LOW (ref 22–32)
Chloride: 116 mmol/L — ABNORMAL HIGH (ref 101–111)
Creatinine, Ser: 1.44 mg/dL — ABNORMAL HIGH (ref 0.61–1.24)
GFR, EST AFRICAN AMERICAN: 56 mL/min — AB (ref >60)
GFR, EST NON AFRICAN AMERICAN: 48 mL/min — AB (ref >60)
GLUCOSE: 146 mg/dL — AB (ref 65–99)
Potassium: 3.6 mmol/L (ref 3.5–5.1)
SODIUM: 143 mmol/L (ref 135–145)

## 2015-10-31 LAB — HEPARIN LEVEL (UNFRACTIONATED): HEPARIN UNFRACTIONATED: 0.24 [IU]/mL — AB (ref 0.30–0.70)

## 2015-10-31 MED ORDER — INSULIN ASPART 100 UNIT/ML ~~LOC~~ SOLN
0.0000 [IU] | Freq: Every day | SUBCUTANEOUS | Status: DC
  Administered 2015-10-31 – 2015-11-02 (×2): 2 [IU] via SUBCUTANEOUS

## 2015-10-31 MED ORDER — POTASSIUM CHLORIDE CRYS ER 20 MEQ PO TBCR
40.0000 meq | EXTENDED_RELEASE_TABLET | Freq: Once | ORAL | Status: AC
  Administered 2015-10-31: 40 meq via ORAL
  Filled 2015-10-31: qty 2

## 2015-10-31 MED ORDER — TAMSULOSIN HCL 0.4 MG PO CAPS: 0.4000 mg | ORAL_CAPSULE | Freq: Every day | ORAL | Status: DC

## 2015-10-31 MED ORDER — SPIRONOLACTONE 25 MG PO TABS
12.5000 mg | ORAL_TABLET | Freq: Every day | ORAL | Status: DC
  Administered 2015-10-31 – 2015-11-06 (×6): 12.5 mg via ORAL
  Filled 2015-10-31 (×6): qty 1

## 2015-10-31 MED ORDER — INSULIN ASPART 100 UNIT/ML ~~LOC~~ SOLN
0.0000 [IU] | Freq: Three times a day (TID) | SUBCUTANEOUS | Status: DC
  Administered 2015-10-31: 3 [IU] via SUBCUTANEOUS
  Administered 2015-11-01: 5 [IU] via SUBCUTANEOUS

## 2015-10-31 MED ORDER — DIGOXIN 125 MCG PO TABS: 0.1250 mg | ORAL_TABLET | Freq: Every day | ORAL | Status: DC

## 2015-10-31 MED ORDER — MIRTAZAPINE 15 MG PO TABS
15.0000 mg | ORAL_TABLET | Freq: Every day | ORAL | Status: DC
  Administered 2015-10-31 – 2015-11-05 (×6): 15 mg via ORAL
  Filled 2015-10-31 (×6): qty 1

## 2015-10-31 MED ORDER — ASPIRIN EC 325 MG PO TBEC: 325.0000 mg | DELAYED_RELEASE_TABLET | Freq: Every day | ORAL | Status: DC

## 2015-10-31 MED ORDER — CLOPIDOGREL BISULFATE 75 MG PO TABS
75.0000 mg | ORAL_TABLET | Freq: Every day | ORAL | Status: DC
  Administered 2015-11-02 – 2015-11-06 (×5): 75 mg via ORAL
  Filled 2015-10-31 (×5): qty 1

## 2015-10-31 MED ORDER — ENOXAPARIN SODIUM 40 MG/0.4ML ~~LOC~~ SOLN
40.0000 mg | Freq: Every day | SUBCUTANEOUS | Status: DC
  Administered 2015-10-31 – 2015-11-06 (×6): 40 mg via SUBCUTANEOUS
  Filled 2015-10-31 (×6): qty 0.4

## 2015-10-31 MED ORDER — DIGOXIN 125 MCG PO TABS
0.1250 mg | ORAL_TABLET | Freq: Every day | ORAL | Status: DC
  Administered 2015-10-31 – 2015-11-06 (×6): 0.125 mg via ORAL
  Filled 2015-10-31 (×6): qty 1

## 2015-10-31 MED ORDER — ASPIRIN 81 MG PO CHEW
81.0000 mg | CHEWABLE_TABLET | Freq: Every day | ORAL | Status: DC
  Administered 2015-10-31: 81 mg via ORAL
  Filled 2015-10-31: qty 1

## 2015-10-31 MED ORDER — CLOPIDOGREL BISULFATE 75 MG PO TABS
300.0000 mg | ORAL_TABLET | Freq: Once | ORAL | Status: AC
  Administered 2015-10-31: 300 mg via ORAL
  Filled 2015-10-31: qty 4

## 2015-10-31 MED ORDER — AMIODARONE HCL 200 MG PO TABS
400.0000 mg | ORAL_TABLET | Freq: Two times a day (BID) | ORAL | Status: DC
  Administered 2015-10-31 – 2015-11-04 (×8): 400 mg via ORAL
  Filled 2015-10-31 (×8): qty 2

## 2015-10-31 NOTE — Progress Notes (Signed)
Patient transferred from 43M to 3S11 with 2 RNs on monitor. Patient awake, oriented to self, time and situation, unable to tell where he is. Bilateral wrist restraints on per order. Vitals stable at this time, will continue to monitor.

## 2015-10-31 NOTE — Evaluation (Signed)
Occupational Therapy Evaluation Patient Details Name: [name] [name] MRN: [id number] DOB: 01/02/1946 Today's Date: 10/31/2015    History of Present Illness 70 yo M with plethora of health issues, EF 15%, IACD, CVA rt side weakness 2015, IDDM who presents to [facility] ED with 24 hours of increased confusion, rt arm tremor, agitation. Found to have NSTEMI. He vomited and most likely aspirated 7/6 with patchy pna in the right upper lobe and right lower lobe. Intubated from 7/6 to 7/8   Clinical Impression   This 70 yo male admitted with above presents to acute OT with deficits below (see OT problem list) thus affecting his PLOF of what he reports as Independent. He will benefit from acute OT with follow up OT at SNF to get back to PLOF.    Follow Up Recommendations  SNF;Other (comment);Supervision/Assistance - 24 hour (pending improvement by be able to progress to [facility])    Equipment Recommendations  3 in 1 bedside comode       Precautions / Restrictions Precautions Precautions: Fall Restrictions Weight Bearing Restrictions: No      Mobility Bed Mobility Overal bed mobility: Needs Assistance Bed Mobility: Supine to Sit;Sit to Supine     Supine to sit: Min assist Sit to supine: Min guard   General bed mobility comments: increased time to perform, cues for initiation and positioning. assist for ssafety and elevation to upright.   Transfers Overall transfer level: Needs assistance Equipment used: 2 person hand held assist Transfers: Sit to/from Stand Sit to Stand: Mod assist         General transfer comment: moderate assist for stability, cues for hand placement. Patient with significant posterior list in standing, 2 persons moderate assist to maintain static standing with max multimodal cues for weight shift over BOS    Balance Overall balance assessment: History of Falls;Needs assistance Sitting-balance support: No upper extremity supported;Feet supported Sitting  balance-Leahy Scale: Fair     Standing balance support: Bilateral upper extremity supported;During functional activity Standing balance-Leahy Scale: Poor Standing balance comment: reliant on RW and A for static and dynamic standing balance with tendency for posterior lean                            ADL Overall ADL's : Needs assistance/impaired Eating/Feeding: Set up;Supervision/ safety;Sitting   Grooming: Set up;Supervision/safety;Sitting   Upper Body Bathing: Set up;Supervision/ safety;Sitting   Lower Body Bathing: Supervison/ safety;Set up (with Mod A sit<>stand)   Upper Body Dressing : Minimal assistance;Sitting   Lower Body Dressing: Supervision/safety;Set up (with Mod A sit<>stand)   Toilet Transfer: Moderate assistance;Ambulation;RW Toilet Transfer Details (indicate cue type and reason): bed>hallway>back to bed Toileting- Clothing Manipulation and Hygiene: Minimal assistance (with Mod-Max A for dynamic balance)         General ADL Comments: Increased time for all tasks due to decreased use of RUE     Vision Vision Assessment?: Yes Eye Alignment: Within Functional Limits Ocular Range of Motion: Within Functional Limits Alignment/Gaze Preference: Within Defined Limits Tracking/Visual Pursuits: Able to track stimulus in all quads without difficulty Convergence: Within functional limits Visual Fields: No apparent deficits          Pertinent Vitals/Pain Pain Assessment: No/denies pain     Hand Dominance Right   Extremity/Trunk Assessment Upper Extremity Assessment Upper Extremity Assessment: RUE deficits/detail RUE Deficits / Details: assymetrical strength RUE comapred to LUE; slower movements and decreased coordination RUE Sensation: decreased proprioception RUE Coordination: decreased fine  motor;decreased gross motor   Lower Extremity Assessment Lower Extremity Assessment:  (decreased Bil coordination)       Communication  Communication Communication: No difficulties   Cognition Arousal/Alertness: Awake/alert Behavior During Therapy: Flat affect;Impulsive Overall Cognitive Status: Impaired/Different from baseline Area of Impairment: Attention;Safety/judgement;Awareness;Problem solving     Memory: Decreased short-term memory   Safety/Judgement: Decreased awareness of safety;Decreased awareness of deficits Awareness: Emergent Problem Solving: Slow processing;Decreased initiation;Difficulty sequencing;Requires verbal cues;Requires tactile cues                Home Living Family/patient expects to be discharged to:: Private residence Living Arrangements: Spouse/significant other Available Help at Discharge: Family;Available 24 hours/day Type of Home: House Home Access: Stairs to enter [employer] of Steps: 1 Entrance Stairs-Rails: Right Home Layout: One level     Bathroom Shower/Tub: Teacher, early years/pre: Standard     Home Equipment: Environmental consultant - 2 wheels;Cane - single point;Tub bench          Prior Functioning/Environment Level of Independence: Independent        Comments: only uses cane in crowds    OT Diagnosis: Generalized weakness;Cognitive deficits;Hemiplegia non-dominant side   OT Problem List: Decreased strength;Impaired balance (sitting and/or standing);Decreased cognition;Decreased safety awareness;Decreased coordination;Decreased knowledge of use of DME or AE   OT Treatment/Interventions: Self-care/ADL training;Patient/family education;Balance training;Therapeutic activities;DME and/or AE instruction;Cognitive remediation/compensation    OT Goals(Current goals can be found in the care plan section) Acute Rehab OT Goals Patient Stated Goal: none stated Time For Goal Achievement: 11/14/15 Potential to Achieve Goals: Good  OT Frequency: Min 2X/week           Co-evaluation PT/OT/SLP Co-Evaluation/Treatment: Yes Reason for Co-Treatment: Necessary to  address cognition/behavior during functional activity;For patient/therapist safety PT goals addressed during session: Mobility/safety with mobility OT goals addressed during session: ADL's and self-care;Strengthening/ROM      End of Session Equipment Utilized During Treatment: Gait belt;Rolling walker Nurse Communication: Mobility status  Activity Tolerance: Patient tolerated treatment well Patient left: in bed;with call bell/phone within reach;with bed alarm set;with restraints reapplied   Time: [id number] OT Time Calculation (min): 28 min Charges:  OT General Charges $OT Visit: 1 Procedure OT Evaluation $OT Eval Moderate Complexity: 1 Procedure  Almon Register [id number] 10/31/2015, 11:58 AM

## 2015-10-31 NOTE — Progress Notes (Addendum)
Advanced Heart Failure Rounding Note  Primary Physician: [name], MD Primary Cardiologist: [place] Electrophysiologist: Caryl Comes   Reason for Consultation: ICD shocks  Subjective:    Admitted to ICU 10/27/15 after being found down at home with ? CVA findings. Intubated for airway protection with vomiting.  Had VT PTA with HV therapy. Extubated 7/8.   EEG- no seizure activity. Started on keppra by Neurology.  ECHO- 10/29/15 EF 15-20%. Diffuse HK. Akinesis apical myocardium. Grade II DD.   Denies SOB/CP.    Objective:   Weight Range: 160 lb 4.4 oz (72.7 kg) Body mass index is 20.57 kg/(m^2).   Vital Signs:   Temp:  [97.8 F (36.6 C)-98.6 F (37 C)] 97.8 F (36.6 C) (07/10 0718) Pulse Rate:  [71-116] 114 (07/10 0718) Resp:  [26-34] 31 (07/10 0718) BP: (103-139)/(69-102) 126/87 mmHg (07/10 0718) SpO2:  [92 %-100 %] 92 % (07/10 0718) Weight:  [160 lb 4.4 oz (72.7 kg)] 160 lb 4.4 oz (72.7 kg) (07/10 0336) Last BM Date:  (unknown PTA?)  Weight change: Filed Weights   10/29/15 0412 10/30/15 0443 10/31/15 0336  Weight: 153 lb 11.2 oz (69.718 kg) 160 lb 4.4 oz (72.7 kg) 160 lb 4.4 oz (72.7 kg)    Intake/Output:   Intake/Output Summary (Last 24 hours) at 10/31/15 0827 Last data filed at 10/31/15 0700  Gross per 24 hour  Intake 1599.6 ml  Output    845 ml  Net  754.6 ml     Physical Exam: General: NAD. In bed.  HEENT: Normal  Neck: supple. JVP 5-6 Carotids 2+ bilat; no bruits. No thyromegaly or nodule noted. Cor: PMI nondisplaced. Regular rate & rhythm. Regular rate and rhythm, 2/6 systolic murmur  Lungs: Mechanical breath sounds.  Abdomen: soft, NT, ND, no HSM. No bruits or masses. +BS  Extremities: no cyanosis, clubbing, rash, edema. R and LUE mittens.  No DP pulse appreciated either side.  Neuro: alert & oriented x person, cranial nerves grossly intact. moves all 4 extremities w/o difficulty. Affect pleasant  Telemetry: Sinus Tach 110s    Labs: CBC  Recent Labs  10/30/15 0500 10/31/15 0527  WBC 14.6* 11.9*  HGB 11.5* 11.5*  HCT 36.4* 35.2*  MCV 91.0 87.6  PLT 179 546   Basic Metabolic Panel  Recent Labs  10/29/15 0459 10/29/15 1601 10/30/15 0500 10/31/15 0527  NA 141  --  144 143  K 4.0  --  3.8 3.6  CL 114*  --  116* 116*  CO2 22  --  21* 20*  GLUCOSE 301*  --  122* 146*  BUN 19  --  16 18  CREATININE 1.21  --  1.20 1.44*  CALCIUM 7.8*  --  8.1* 8.3*  MG 2.2 2.1  --   --   PHOS 2.4* 2.1*  --   --    Liver Function Tests No results for input(s): AST, ALT, ALKPHOS, BILITOT, PROT, ALBUMIN in the last 72 hours. No results for input(s): LIPASE, AMYLASE in the last 72 hours. Cardiac Enzymes No results for input(s): CKTOTAL, CKMB, CKMBINDEX, TROPONINI in the last 72 hours.  BNP: BNP (last 3 results)  Recent Labs  04/11/15 0400 07/26/15 1707 10/27/15 1124  BNP 2568.2* 2061.3* 1318.0*    ProBNP (last 3 results) No results for input(s): PROBNP in the last 8760 hours.   D-Dimer No results for input(s): DDIMER in the last 72 hours. Hemoglobin A1C No results for input(s): HGBA1C in the last 72 hours. Fasting Lipid Panel No  results for input(s): CHOL, HDL, LDLCALC, TRIG, CHOLHDL, LDLDIRECT in the last 72 hours. Thyroid Function Tests No results for input(s): TSH, T4TOTAL, T3FREE, THYROIDAB in the last 72 hours.  Invalid input(s): FREET3  Other results:     Imaging/Studies:  No results found.  Latest Echo  Latest Cath   Medications:     Scheduled Medications: . ampicillin-sulbactam (UNASYN) IV  1.5 g Intravenous Q6H  . antiseptic oral rinse  7 mL Mouth Rinse QID  . aspirin  325 mg Per Tube Daily  . carvedilol  3.125 mg Oral BID WC  . chlorhexidine gluconate (SAGE KIT)  15 mL Mouth Rinse BID  . insulin aspart  0-9 Units Subcutaneous Q4H  . insulin glargine  10 Units Subcutaneous Daily  . rosuvastatin  40 mg Per Tube q1800  . sodium chloride  1,000 mL Intravenous Once  .  tamsulosin  0.4 mg Oral Daily        Infusions: . sodium chloride 10 mL/hr at 10/30/15 1059  . amiodarone 30 mg/hr (10/31/15 0559)  . heparin 1,550 Units/hr (10/30/15 1630)    PRN Medications: acetone (urine) test, RESOURCE THICKENUP CLEAR   Assessment   1. VT 2. NSTEMI 3. Chronic systolic HF: St Jude ICD.  4. CAD 5. ?CVA  6. Sepsis 7. Acute respiratory failure requiring intubation: Suspect aspiration.  8. UTI 9. Seizures   Plan   He has had a large NSTEMI. Remains on heparin drip and amio drip with VT and NSTEMI. Goal K > 4.0 and Mg > 2.0. Give extra 40 meq K today. Check mag tomorrow.   No further VT on IV amio. Stop heparin. Continue low dose b-blocker   Will need to consider cath this week however last cath in 11/2013 demonstrated triple vessel coronary artery disease. With 99%p LAD (s/p previous anterior MI), LCX 95% and 100% non-dominant RCA. There was consideration of high risk revascularization of the circumflex lesion. However, stress viability study which demonstrated no evidence of viability. So options may be limited. Dr Angelena Form plans to review today.  ECHO completed 7/8 with severely reduced EF 15-20%, grade II DD. Diffuse HK. This is similar to the past. Continue carvedilol. Add 12.5 mg spiro daily and add digoxin 0.125 mg daily (was on at home). Check CO-OX now. Hold off on ARB for now.   ? Probable seizures- Neurology following. EEG no seizure activity. On keppra 500 mg twice a day.   Length of Stay: 4   Amy Clegg NP-C   10/31/2015, 8:27 AM  Advanced Heart Failure Team Pager [phone] (M-F; 7a - 4p)  Please contact [facility] for night-coverage after hours (4p -7a ) and weekends on [email or web address]  Patient seen with NP, agree with the above note.  Admitted with aspiration PNA, VT with ICD shock.  Troponin markedly elevated.   Now on amiodarone gtt, can transition to po today.  He had dense anterior scar on prior cardiac MRI, thought to be nonviable,  so at risk for scar-related VT.  However, troponin increased to > 65 so could be VT related to NSTEMI with new plaque rupture.  - Can transition to po amiodarone today.   Has extensive past history of CAD evaluation though it has been 2 years since last cath.  He had 3VD with nonviable anterior wall in the past (on cardiac MRI), decided against high risk LCx intervention in the past.   - Will continue ASA and Crestor. - Start Plavix  - Can stop heparin (has been on >  48 hrs, no further chest pain).  - Will review cath films with interventionalist prior to repeat cath as there may not be any good revascularization options.   Echo showed EF comparable to the past, 15-20%. He is on Coreg, restart digoxin and spironolactone today.  Will add ARB if creatinine remains stable.  Not volume overloaded on exam.   Loralie Champagne 10/31/2015 9:17 AM

## 2015-10-31 NOTE — Evaluation (Signed)
Physical Therapy Evaluation Patient Details Name: [name] [name] MRN: [id number] DOB: 28-Oct-1945 Today's Date: 10/31/2015   History of Present Illness  70 yo M with plethora of health issues, EF 15%, IACD, CVA rt side weakness 2015, IDDM who presents to [facility] ED with 24 hours of increased confusion, rt arm tremor, agitation. Found to have NSTEMI. He vomited and most likely aspirated 7/6 with patchy pna in the right upper lobe and right lower lobe. Intubated from 7/6 to 7/8  Clinical Impression  Patient demonstrates deficits in functional mobility as indicated below. Will benefit from continued skilled PT to address deficits and maximize functional Will see as indicated and progress as tolerated. At this time, recommend ST SNF upon acute discharge, however, if patient progresses well with mobility and has adequate assist, may consider HHPT.    Follow Up Recommendations SNF;Supervision/Assistance - 24 hour (vs HHPT pending progress and available assist)    Equipment Recommendations  None recommended by PT    Recommendations for Other Services       Precautions / Restrictions Precautions Precautions: Fall Restrictions Weight Bearing Restrictions: No      Mobility  Bed Mobility Overal bed mobility: Needs Assistance Bed Mobility: Supine to Sit;Sit to Supine     Supine to sit: Min assist Sit to supine: Min guard   General bed mobility comments: increased time to perform, cues for initiation and positioning. assist for ssafety and elevation to upright.   Transfers Overall transfer level: Needs assistance Equipment used: 2 person hand held assist Transfers: Sit to/from Stand Sit to Stand: Mod assist         General transfer comment: moderate assist for stability, cues for hand placement. Patient with significant posterior list in standing, 2 persons moderate assist to maintain static standing with max multimodal cues for weight shift over BOS  Ambulation/Gait Ambulation/Gait  assistance: Max assist Ambulation Distance (Feet): 60 Feet Assistive device: 1 person hand held assist Gait Pattern/deviations: Step-through pattern;Decreased stride length;Shuffle;Drifts right/left;Narrow base of support Gait velocity: decreased Gait velocity interpretation: Below normal speed for age/gender General Gait Details: significant posterior lean during ambulation, patient unable to maintain upright without significant physical assist. multiple posterior LOb when assist decreased  Stairs            Wheelchair Mobility    Modified Rankin (Stroke Patients Only) Modified Rankin (Stroke Patients Only) Pre-Morbid Rankin Score: No symptoms Modified Rankin: Moderately severe disability     Balance Overall balance assessment: History of Falls                                           Pertinent Vitals/Pain Pain Assessment: No/denies pain    Home Living Family/patient expects to be discharged to:: Private residence Living Arrangements: Spouse/significant other Available Help at Discharge: Family;Available 24 hours/day Type of Home: House Home Access: Stairs to enter Entrance Stairs-Rails: Right Entrance Stairs-Number of Steps: 1 Home Layout: One level Home Equipment: Walker - 2 wheels;Cane - single point;Tub bench      Prior Function Level of Independence: Independent         Comments: only uses cane in crowds     Hand Dominance   Dominant Hand: Right    Extremity/Trunk Assessment   Upper Extremity Assessment: RUE deficits/detail RUE Deficits / Details: assymetrical strength RUE comapred to LUE   RUE Sensation: decreased proprioception     Lower Extremity Assessment:  Generalized weakness (decreased bilateral coordination)         Communication   Communication: No difficulties  Cognition Arousal/Alertness: Awake/alert Behavior During Therapy: Flat affect;Impulsive Overall Cognitive Status: Impaired/Different from  baseline Area of Impairment: Attention;Safety/judgement;Awareness;Problem solving     Memory: Decreased short-term memory   Safety/Judgement: Decreased awareness of safety;Decreased awareness of deficits Awareness: Emergent Problem Solving: Slow processing;Decreased initiation;Difficulty sequencing;Requires verbal cues;Requires tactile cues      General Comments      Exercises        Assessment/Plan    PT Assessment Patient needs continued PT services  PT Diagnosis Difficulty walking;Abnormality of gait;Generalized weakness;Altered mental status   PT Problem List Decreased strength;Decreased activity tolerance;Decreased balance;Decreased mobility;Decreased coordination;Decreased cognition;Decreased safety awareness  PT Treatment Interventions DME instruction;Gait training;Stair training;Functional mobility training;Therapeutic activities;Therapeutic exercise;Balance training;Patient/family education   PT Goals (Current goals can be found in the Care Plan section) Acute Rehab PT Goals Patient Stated Goal: none stated PT Goal Formulation: With patient Time For Goal Achievement: 11/14/15 Potential to Achieve Goals: Good    Frequency Min 3X/week   Barriers to discharge        Co-evaluation PT/OT/SLP Co-Evaluation/Treatment: Yes Reason for Co-Treatment: Necessary to address cognition/behavior during functional activity;For patient/therapist safety PT goals addressed during session: Mobility/safety with mobility         End of Session Equipment Utilized During Treatment: Gait belt Activity Tolerance: Patient tolerated treatment well Patient left: in bed;with call bell/phone within reach;with bed alarm set;with restraints reapplied Nurse Communication: Mobility status         Time: [id number] PT Time Calculation (min) (ACUTE ONLY): 30 min   Charges:   PT Evaluation $PT Eval Moderate Complexity: 1 Procedure     PT G CodesDuncan Dull 11/29/2015,  10:20 AM Alben Deeds, PT DPT  [phone]

## 2015-10-31 NOTE — Progress Notes (Signed)
ANTICOAGULATION CONSULT NOTE - Follow Up Consult  Pharmacy Consult for Heparin Indication: chest pain/ACS   No Known Allergies  Patient Measurements: Height: 6\' 2"  (188 cm) Weight: 160 lb 4.4 oz (72.7 kg) IBW/kg (Calculated) : 82.2  Vital Signs: Temp: 97.8 F (36.6 C) (07/10 0718) Temp Source: Oral (07/10 0718) BP: 126/87 mmHg (07/10 0718) Pulse Rate: 114 (07/10 0718)  Labs:  Recent Labs  10/29/15 0459 10/29/15 1400 10/30/15 0500 10/31/15 0527  HGB 10.9*  --  11.5* 11.5*  HCT 35.3*  --  36.4* 35.2*  PLT 168  --  179 205  HEPARINUNFRC 0.17* 0.33 0.37 0.24*  CREATININE 1.21  --  1.20 1.44*    Estimated Creatinine Clearance: 49.8 mL/min (by C-G formula based on Cr of 1.44).  Medications: Heparin @ 1550 units/hr  Assessment: 70yom continues on heparin for NSTEMI. Heparin level is low this am at 0.24.   Noted plan for possible cath based on viability study by interventionalist today.   Patient has completed his course of heparin for ACS, medication was stopped this am by cardiology.  Goal of Therapy:  Heparin level 0.3-0.7 units/ml Monitor platelets by anticoagulation protocol: Yes   Plan: D/c heparin Sq lovenox for px No change in unasyn  Erin Hearing PharmD., BCPS Clinical Pharmacist Pager [phone] 10/31/2015 12:13 PM

## 2015-10-31 NOTE — Progress Notes (Signed)
[place] TEAM 1 - Stepdown/ICU TEAM  [name] [name]  [id number] DOB: May 14, 1945 DOA: 10/27/2015 PCP: [name], MD    Brief Narrative:  70 yo M with a complex hx to include EF 15%, IACD, CVA rt side weakness 2015, and DM who presented to [facility] ED with 24 hours of increased confusion, rt arm tremor, and agitation. He vomited and most likely aspirated and required intubation for airway protection. He had VT in ER and his  IACD fired.   Significant Events: 7/6 - admitted - V tach in the ED, AICD fired - intubated 7/8 - extubated 7/10 - [facility] assumed care  Subjective: The patient is awake and alert but confused.  He denies chest pain shortness breath fevers chills nausea or vomiting but his review of systems is of questionable validity due to his confusion.  He does not appear to be in any acute distress.  I spoke with his wife at the bedside.  Assessment & Plan:  Acute Hypoxic Respiratory Failure Secondary to aspiration pneumonia S/P extubation 7/8 - sats in the upper 90s on room air  Large NSTEMI Peak Troponin > 65 - care per Cardiology - consideration is being given to repeat cardiac cath  H/O Ischemic Cardiomyopathy w/ Systolic CHF last TTE w/ EF 15%, diffuse septal hypokinesis and apical akinesis along w/ cavity size severely dilated - f/u TTE this admit notes EF still 15-20% - care per cardiology  Ventricular Tachycardia - IACD Fired in ED 7/6 Care per Cards - on amio gtt > oral   Hyperlipidemia Resume Crestor when oral intake improves  Probable Partial Seizure Care per Neuro - holding on med tx for now   Acute Encephalopathy Multifactorial - improving  Possible New Acute CVA - H/O CVA 2015 - Mild right-sided weakness & slurred speech Symptom onset 7/5- Neurology consulted & following  Acute on CKD III Resolved - baseline Cr 1.7-2.2  Recent Labs Lab 10/27/15 1124 10/28/15 0420 10/29/15 0459 10/30/15 0500 10/31/15 0527  CREATININE 1.61* 1.31* 1.21 1.20  1.44*    Nutrition - Probable Aspiration Speech Eval has cleared for D3 with thin liquid diet  Anemia Mild - no signs of active bleeding  Klebsiella UTI abx tx continues   DM A1c 12.0 - CBGs currently recently controlled  DVT prophylaxis: lovenox  Code Status: FULL CODE Family Communication: Spoke to wife at bedside Disposition Plan: SDU  Consultants:  [facility] Cardiology  [facility] Neurology   Antimicrobials:  Unasyn 7/6 >  Objective: Blood pressure 109/76, pulse 89, temperature 97.5 F (36.4 C), temperature source Oral, resp. rate 19, height 6' 2"  (1.88 m), weight 74.571 kg (164 lb 6.4 oz), SpO2 97 %.  Intake/Output Summary (Last 24 hours) at 10/31/15 1158 Last data filed at 10/31/15 1135  Gross per 24 hour  Intake 1675.14 ml  Output    845 ml  Net 830.14 ml   Filed Weights   10/30/15 0443 10/31/15 0336 10/31/15 0718  Weight: 72.7 kg (160 lb 4.4 oz) 72.7 kg (160 lb 4.4 oz) 74.571 kg (164 lb 6.4 oz)    Examination: General: No acute respiratory distress - alert but confused Lungs: Clear to auscultation bilaterally without wheezes  Cardiovascular: Regular rate and rhythm with 2/6 systolic M Abdomen: Nontender, nondistended, soft, bowel sounds positive, no rebound, no ascites, no appreciable mass Extremities: No significant cyanosis, or clubbing - trace edema bilateral lower extremities  CBC:  Recent Labs Lab 10/27/15 0757  10/27/15 1124 10/28/15 0420 10/29/15 0459 10/30/15 0500 10/31/15 2423  WBC 18.3*  --  15.9* 12.2* 12.0* 14.6* 11.9*  NEUTROABS 16.6*  --   --   --   --   --   --   HGB 12.0*  < > 11.5* 10.3* 10.9* 11.5* 11.5*  HCT 38.0*  < > 35.2* 31.7* 35.3* 36.4* 35.2*  MCV 88.2  --  87.3 88.5 91.2 91.0 87.6  PLT 224  --  163 151 168 179 205  < > = values in this interval not displayed. Basic Metabolic Panel:  Recent Labs Lab 10/27/15 1124 10/28/15 0420 10/28/15 1250 10/28/15 1736 10/29/15 0459 10/29/15 1601 10/30/15 0500 10/31/15 0527  NA  139 143  --   --  141  --  144 143  K 5.6* 3.9  --   --  4.0  --  3.8 3.6  CL 106 114*  --   --  114*  --  116* 116*  CO2 24 25  --   --  22  --  21* 20*  GLUCOSE 457* 129*  --   --  301*  --  122* 146*  BUN 25* 20  --   --  19  --  16 18  CREATININE 1.61* 1.31*  --   --  1.21  --  1.20 1.44*  CALCIUM 8.2* 8.0*  --   --  7.8*  --  8.1* 8.3*  MG 1.8  --  1.8 2.4 2.2 2.1  --   --   PHOS 3.1  --  2.9 2.9 2.4* 2.1*  --   --    GFR: Estimated Creatinine Clearance: 51.1 mL/min (by C-G formula based on Cr of 1.44).  Liver Function Tests:  Recent Labs Lab 10/27/15 0757 10/27/15 1124  AST 35 60*  ALT 27 27  ALKPHOS 142* 125  BILITOT 0.8 1.3*  PROT 7.2 6.3*  ALBUMIN 3.2* 2.8*    Coagulation Profile:  Recent Labs Lab 10/27/15 0757 10/27/15 1124  INR 1.10 1.20    Cardiac Enzymes:  Recent Labs Lab 10/27/15 0757 10/27/15 1124 10/27/15 1530 10/27/15 2047  TROPONINI 1.36* 20.31* >65.00* >65.00*    HbA1C: HGB A1C MFR BLD  Date/Time Value Ref Range Status  10/27/2015 11:24 AM 12.0* 4.8 - 5.6 % Final    Comment:    (NOTE)         Pre-diabetes: 5.7 - 6.4         Diabetes: >6.4         Glycemic control for adults with diabetes: <7.0   07/26/2015 05:07 PM 9.1* 4.8 - 5.6 % Final    Comment:    (NOTE)         Pre-diabetes: 5.7 - 6.4         Diabetes: >6.4         Glycemic control for adults with diabetes: <7.0     CBG:  Recent Labs Lab 10/30/15 1957 10/31/15 0021 10/31/15 0353 10/31/15 0815 10/31/15 1132  GLUCAP 168* 206* 140* 157* 165*    Recent Results (from the past 240 hour(s))  Blood culture (routine x 2)     Status: None (Preliminary result)   Collection Time: 10/27/15  8:57 AM  Result Value Ref Range Status   Specimen Description BLOOD LEFT FOREARM  Final   Special Requests BOTTLES DRAWN AEROBIC ONLY  8CC  Final   Culture NO GROWTH 3 DAYS  Final   Report Status PENDING  Incomplete  Blood culture (routine x 2)     Status: None (Preliminary  result)     Collection Time: 10/27/15  8:59 AM  Result Value Ref Range Status   Specimen Description BLOOD RIGHT ANTECUBITAL  Final   Special Requests BOTTLES DRAWN AEROBIC AND ANAEROBIC  5CC  Final   Culture NO GROWTH 3 DAYS  Final   Report Status PENDING  Incomplete  Urine culture     Status: Abnormal   Collection Time: 10/27/15  9:00 AM  Result Value Ref Range Status   Specimen Description URINE, CATHETERIZED  Final   Special Requests Normal  Final   Culture >=100,000 COLONIES/mL KLEBSIELLA OXYTOCA (A)  Final   Report Status 10/29/2015 FINAL  Final   Organism ID, Bacteria KLEBSIELLA OXYTOCA (A)  Final      Susceptibility   Klebsiella oxytoca - MIC*    AMPICILLIN 16 RESISTANT Resistant     CEFAZOLIN <=4 SENSITIVE Sensitive     CEFTRIAXONE <=1 SENSITIVE Sensitive     CIPROFLOXACIN <=0.25 SENSITIVE Sensitive     GENTAMICIN <=1 SENSITIVE Sensitive     IMIPENEM <=0.25 SENSITIVE Sensitive     NITROFURANTOIN <=16 SENSITIVE Sensitive     TRIMETH/SULFA <=20 SENSITIVE Sensitive     AMPICILLIN/SULBACTAM 4 SENSITIVE Sensitive     PIP/TAZO <=4 SENSITIVE Sensitive     * >=100,000 COLONIES/mL KLEBSIELLA OXYTOCA  MRSA PCR Screening     Status: None   Collection Time: 10/27/15 11:28 AM  Result Value Ref Range Status   MRSA by PCR NEGATIVE NEGATIVE Final    Comment:        The GeneXpert MRSA Assay (FDA approved for NASAL specimens only), is one component of a comprehensive MRSA colonization surveillance program. It is not intended to diagnose MRSA infection nor to guide or monitor treatment for MRSA infections.   Culture, blood (routine x 2)     Status: None (Preliminary result)   Collection Time: 10/27/15 11:39 AM  Result Value Ref Range Status   Specimen Description BLOOD LEFT WRIST  Final   Special Requests BOTTLES DRAWN AEROBIC ONLY 5CC  Final   Culture NO GROWTH 3 DAYS  Final   Report Status PENDING  Incomplete  Culture, blood (routine x 2)     Status: None (Preliminary result)    Collection Time: 10/27/15 11:44 AM  Result Value Ref Range Status   Specimen Description BLOOD LEFT HAND  Final   Special Requests IN PEDIATRIC BOTTLE 1CC  Final   Culture NO GROWTH 3 DAYS  Final   Report Status PENDING  Incomplete  Culture, respiratory (NON-Expectorated)     Status: None (Preliminary result)   Collection Time: 10/28/15  1:05 PM  Result Value Ref Range Status   Specimen Description TRACHEAL ASPIRATE  Final   Special Requests Normal  Final   Gram Stain   Final    ABUNDANT WBC PRESENT, PREDOMINANTLY PMN NO SQUAMOUS EPITHELIAL CELLS SEEN NO ORGANISMS SEEN    Culture RARE YEAST IDENTIFICATION TO FOLLOW   Final   Report Status PENDING  Incomplete     Scheduled Meds: . amiodarone  400 mg Oral BID  . ampicillin-sulbactam (UNASYN) IV  1.5 g Intravenous Q6H  . antiseptic oral rinse  7 mL Mouth Rinse QID  . aspirin  81 mg Oral Daily  . carvedilol  3.125 mg Oral BID WC  . chlorhexidine gluconate (SAGE KIT)  15 mL Mouth Rinse BID  . [START ON 11/01/2015] clopidogrel  75 mg Oral Daily  . digoxin  0.125 mg Oral Daily  . enoxaparin (LOVENOX) injection  40 mg  Subcutaneous Daily  . insulin aspart  0-9 Units Subcutaneous Q4H  . insulin glargine  10 Units Subcutaneous Daily  . rosuvastatin  40 mg Per Tube q1800  . sodium chloride  1,000 mL Intravenous Once  . spironolactone  12.5 mg Oral Daily  . tamsulosin  0.4 mg Oral Daily   Continuous Infusions: . sodium chloride 10 mL/hr at 10/30/15 1059     LOS: 4 days   Time spent: 35 minutes   Cherene Altes, MD [facility] Office  [phone] Pager - Text Page per Amion as per below:  On-Call/Text Page:      [email or web address]      password [email or web address]  If 7PM-7AM, please contact night-coverage [email or web address] Password [email or web address] 10/31/2015, 11:58 AM

## 2015-10-31 NOTE — Progress Notes (Signed)
Speech Language Pathology Treatment: Dysphagia  Patient Details Name: [name] [name] MRN: [id number] DOB: [birth year] Today's Date: 10/31/2015 Time: [id number] SLP Time Calculation (min) (ACUTE ONLY): 10 min  Assessment / Plan / Recommendation Clinical Impression  Pt politely declined solid POs, stating that he was too full from breakfast, but did consume advanced trials of thin liquids by cup and straw. Intermittent throat clearing noted with straw sips, which stopped with SLP removal of straw. Min cues provided for small, single cup sips, with only one cough noted after larger, more impulsive sip. Recommend to continue Dys 3 diet but with trial of thin liquids by cup only. SLP to f/u for tolerance.   HPI HPI: 70 yo M with plethora of health issues, EF 15%, IACD, CVA rt side weakness 2015, IDDM who presents to [facility] ED with 24 hours of increased confusion, rt arm tremor, agitation. Found to have NSTEMI. He vomited and most likely aspirated 7/6 with patchy pna in the right upper lobe and right lower lobe. Intubated from 7/6 to 7/8. Pt has no history of being treated by SLP in past admission. Pt reports occasional coughing/choking prior to admission      SLP Plan  Continue with current plan of care     Recommendations  Diet recommendations: Dysphagia 3 (mechanical soft);Thin liquid Liquids provided via: Cup;No straw Medication Administration: Whole meds with puree Supervision: Patient able to self feed;Full supervision/cueing for compensatory strategies Compensations: Slow rate;Small sips/bites Postural Changes and/or Swallow Maneuvers: Seated upright 90 degrees;Upright 30-60 min after meal             Oral Care Recommendations: Oral care BID Follow up Recommendations: Skilled Nursing facility Plan: Continue with current plan of care     GO               Germain Osgood, M.A. CCC-SLP [phone]  Germain Osgood 10/31/2015, 11:28 AM

## 2015-10-31 NOTE — Care Management Important Message (Signed)
Important Message  Patient Details  Name: [name] [name] MRN: [id number] Date of Birth: 1945-11-11   Medicare Important Message Given:  Yes    [name] 10/31/2015, 8:26 AM

## 2015-11-01 ENCOUNTER — Encounter (HOSPITAL_COMMUNITY)
Admission: EM | Disposition: A | Discharge status: Skilled Nursing Facility | Payer: Self-pay | Source: Home / Self Care | Attending: Internal Medicine

## 2015-11-01 ENCOUNTER — Encounter (HOSPITAL_COMMUNITY): Payer: Self-pay | Admitting: Cardiology

## 2015-11-01 DIAGNOSIS — N179 Acute kidney failure, unspecified: Secondary | ICD-10-CM | POA: Diagnosis present

## 2015-11-01 DIAGNOSIS — I255 Ischemic cardiomyopathy: Secondary | ICD-10-CM

## 2015-11-01 DIAGNOSIS — G9340 Encephalopathy, unspecified: Secondary | ICD-10-CM | POA: Diagnosis present

## 2015-11-01 DIAGNOSIS — J690 Pneumonitis due to inhalation of food and vomit: Secondary | ICD-10-CM | POA: Diagnosis present

## 2015-11-01 DIAGNOSIS — I2510 Atherosclerotic heart disease of native coronary artery without angina pectoris: Secondary | ICD-10-CM

## 2015-11-01 DIAGNOSIS — E118 Type 2 diabetes mellitus with unspecified complications: Secondary | ICD-10-CM

## 2015-11-01 DIAGNOSIS — IMO0002 Reserved for concepts with insufficient information to code with codable children: Secondary | ICD-10-CM | POA: Diagnosis present

## 2015-11-01 DIAGNOSIS — E785 Hyperlipidemia, unspecified: Secondary | ICD-10-CM

## 2015-11-01 DIAGNOSIS — I5042 Chronic combined systolic (congestive) and diastolic (congestive) heart failure: Secondary | ICD-10-CM | POA: Diagnosis present

## 2015-11-01 DIAGNOSIS — N183 Chronic kidney disease, stage 3 (moderate): Secondary | ICD-10-CM

## 2015-11-01 DIAGNOSIS — E1165 Type 2 diabetes mellitus with hyperglycemia: Secondary | ICD-10-CM

## 2015-11-01 HISTORY — PX: CARDIAC CATHETERIZATION: SHX172

## 2015-11-01 LAB — CULTURE, BLOOD (ROUTINE X 2)
CULTURE: NO GROWTH
CULTURE: NO GROWTH
Culture: NO GROWTH
Culture: NO GROWTH

## 2015-11-01 LAB — POCT ACTIVATED CLOTTING TIME: Activated Clotting Time: 367 seconds

## 2015-11-01 LAB — COMPREHENSIVE METABOLIC PANEL
ALK PHOS: 140 U/L — AB (ref 38–126)
ALT: 15 U/L — ABNORMAL LOW (ref 17–63)
ANION GAP: 5 (ref 5–15)
AST: 21 U/L (ref 15–41)
Albumin: 2.1 g/dL — ABNORMAL LOW (ref 3.5–5.0)
BUN: 19 mg/dL (ref 6–20)
CALCIUM: 8 mg/dL — AB (ref 8.9–10.3)
CO2: 22 mmol/L (ref 22–32)
Chloride: 114 mmol/L — ABNORMAL HIGH (ref 101–111)
Creatinine, Ser: 1.46 mg/dL — ABNORMAL HIGH (ref 0.61–1.24)
GFR, EST AFRICAN AMERICAN: 55 mL/min — AB (ref >60)
GFR, EST NON AFRICAN AMERICAN: 47 mL/min — AB (ref >60)
Glucose, Bld: 150 mg/dL — ABNORMAL HIGH (ref 65–99)
Potassium: 3.9 mmol/L (ref 3.5–5.1)
SODIUM: 141 mmol/L (ref 135–145)
TOTAL PROTEIN: 5.4 g/dL — AB (ref 6.5–8.1)
Total Bilirubin: 1 mg/dL (ref 0.3–1.2)

## 2015-11-01 LAB — MAGNESIUM: MAGNESIUM: 1.8 mg/dL (ref 1.7–2.4)

## 2015-11-01 LAB — CBC
HEMATOCRIT: 31.7 % — AB (ref 39.0–52.0)
Hemoglobin: 10.1 g/dL — ABNORMAL LOW (ref 13.0–17.0)
MCH: 28 pg (ref 26.0–34.0)
MCHC: 31.9 g/dL (ref 30.0–36.0)
MCV: 87.8 fL (ref 78.0–100.0)
Platelets: 176 10*3/uL (ref 150–400)
RBC: 3.61 MIL/uL — ABNORMAL LOW (ref 4.22–5.81)
RDW: 15.6 % — AB (ref 11.5–15.5)
WBC: 10 10*3/uL (ref 4.0–10.5)

## 2015-11-01 LAB — LEGIONELLA PNEUMOPHILA SEROGP 1 UR AG: L. PNEUMOPHILA SEROGP 1 UR AG: NEGATIVE

## 2015-11-01 LAB — GLUCOSE, CAPILLARY
GLUCOSE-CAPILLARY: 141 mg/dL — AB (ref 65–99)
GLUCOSE-CAPILLARY: 187 mg/dL — AB (ref 65–99)
Glucose-Capillary: 253 mg/dL — ABNORMAL HIGH (ref 65–99)

## 2015-11-01 LAB — DIGOXIN LEVEL: DIGOXIN LVL: 0.2 ng/mL — AB (ref 0.8–2.0)

## 2015-11-01 SURGERY — LEFT HEART CATH AND CORONARY ANGIOGRAPHY

## 2015-11-01 MED ORDER — FENTANYL CITRATE (PF) 100 MCG/2ML IJ SOLN
INTRAMUSCULAR | Status: AC
  Filled 2015-11-01: qty 2

## 2015-11-01 MED ORDER — LIDOCAINE HCL (PF) 1 % IJ SOLN
INTRAMUSCULAR | Status: DC | PRN
  Administered 2015-11-01: 2 mL

## 2015-11-01 MED ORDER — SODIUM CHLORIDE 0.9% FLUSH: 3.0000 mL | INTRAVENOUS | Status: DC | PRN

## 2015-11-01 MED ORDER — ACETAMINOPHEN 325 MG PO TABS
650.0000 mg | ORAL_TABLET | ORAL | Status: DC | PRN
  Administered 2015-11-04: 650 mg via ORAL
  Filled 2015-11-01: qty 2

## 2015-11-01 MED ORDER — ONDANSETRON HCL 4 MG/2ML IJ SOLN: 4.0000 mg | Freq: Four times a day (QID) | INTRAMUSCULAR | Status: DC | PRN

## 2015-11-01 MED ORDER — IOPAMIDOL (ISOVUE-370) INJECTION 76%
INTRAVENOUS | Status: DC | PRN
  Administered 2015-11-01: 50 mL via INTRAVENOUS

## 2015-11-01 MED ORDER — CLOPIDOGREL BISULFATE 300 MG PO TABS: ORAL_TABLET | ORAL | Status: DC | PRN

## 2015-11-01 MED ORDER — IOPAMIDOL (ISOVUE-370) INJECTION 76%
INTRAVENOUS | Status: AC
  Filled 2015-11-01: qty 100

## 2015-11-01 MED ORDER — ASPIRIN 81 MG PO CHEW
81.0000 mg | CHEWABLE_TABLET | Freq: Every day | ORAL | Status: DC
  Administered 2015-11-01 – 2015-11-06 (×6): 81 mg via ORAL
  Filled 2015-11-01 (×6): qty 1

## 2015-11-01 MED ORDER — LIDOCAINE HCL (PF) 1 % IJ SOLN
INTRAMUSCULAR | Status: AC
  Filled 2015-11-01: qty 30

## 2015-11-01 MED ORDER — INSULIN GLARGINE 100 UNIT/ML ~~LOC~~ SOLN
15.0000 [IU] | Freq: Every day | SUBCUTANEOUS | Status: DC
  Administered 2015-11-02: 15 [IU] via SUBCUTANEOUS
  Filled 2015-11-01 (×2): qty 0.15

## 2015-11-01 MED ORDER — BIVALIRUDIN 250 MG IV SOLR
250.0000 mg | INTRAVENOUS | Status: DC | PRN
  Administered 2015-11-01: 1.75 mg/kg/h via INTRAVENOUS

## 2015-11-01 MED ORDER — NITROGLYCERIN 1 MG/10 ML FOR IR/CATH LAB
INTRA_ARTERIAL | Status: DC | PRN
  Administered 2015-11-01: 200 ug via INTRACORONARY

## 2015-11-01 MED ORDER — HEPARIN SODIUM (PORCINE) 1000 UNIT/ML IJ SOLN
INTRAMUSCULAR | Status: DC | PRN
  Administered 2015-11-01: 4000 [IU] via INTRAVENOUS

## 2015-11-01 MED ORDER — NITROGLYCERIN 1 MG/10 ML FOR IR/CATH LAB
INTRA_ARTERIAL | Status: AC
  Filled 2015-11-01: qty 10

## 2015-11-01 MED ORDER — BIVALIRUDIN 250 MG IV SOLR
INTRAVENOUS | Status: AC
  Filled 2015-11-01: qty 250

## 2015-11-01 MED ORDER — VERAPAMIL HCL 2.5 MG/ML IV SOLN
INTRAVENOUS | Status: AC
  Filled 2015-11-01: qty 2

## 2015-11-01 MED ORDER — SODIUM CHLORIDE 0.9% FLUSH
3.0000 mL | Freq: Two times a day (BID) | INTRAVENOUS | Status: DC
  Administered 2015-11-01 – 2015-11-02 (×3): 3 mL via INTRAVENOUS

## 2015-11-01 MED ORDER — IOPAMIDOL (ISOVUE-370) INJECTION 76%
INTRAVENOUS | Status: DC | PRN
  Administered 2015-11-01: 95 mL via INTRA_ARTERIAL

## 2015-11-01 MED ORDER — CLOPIDOGREL BISULFATE 300 MG PO TABS: 300.0000 mg | ORAL_TABLET | Freq: Once | ORAL | Status: DC

## 2015-11-01 MED ORDER — FENTANYL CITRATE (PF) 100 MCG/2ML IJ SOLN
INTRAMUSCULAR | Status: DC | PRN
  Administered 2015-11-01: 25 ug via INTRAVENOUS

## 2015-11-01 MED ORDER — HEPARIN SODIUM (PORCINE) 1000 UNIT/ML IJ SOLN
INTRAMUSCULAR | Status: AC
  Filled 2015-11-01: qty 1

## 2015-11-01 MED ORDER — VERAPAMIL HCL 2.5 MG/ML IV SOLN
INTRAVENOUS | Status: DC | PRN
  Administered 2015-11-01: 10 mL via INTRA_ARTERIAL

## 2015-11-01 MED ORDER — HEPARIN SODIUM (PORCINE) 5000 UNIT/ML IJ SOLN: 5000.0000 [IU] | Freq: Three times a day (TID) | INTRAMUSCULAR | Status: DC

## 2015-11-01 MED ORDER — SODIUM CHLORIDE 0.9 % IV SOLN: 250.0000 mL | INTRAVENOUS | Status: DC | PRN

## 2015-11-01 MED ORDER — SODIUM CHLORIDE 0.9 % WEIGHT BASED INFUSION: 1.0000 mL/kg/h | INTRAVENOUS | Status: DC

## 2015-11-01 MED ORDER — HEPARIN (PORCINE) IN NACL 2-0.9 UNIT/ML-% IJ SOLN
INTRAMUSCULAR | Status: DC | PRN
  Administered 2015-11-01: 1000 mL

## 2015-11-01 MED ORDER — BIVALIRUDIN BOLUS VIA INFUSION - CUPID
INTRAVENOUS | Status: DC | PRN
  Administered 2015-11-01: 56.1 mg via INTRAVENOUS

## 2015-11-01 MED ORDER — CLOPIDOGREL BISULFATE 300 MG PO TABS
ORAL_TABLET | ORAL | Status: DC | PRN
  Administered 2015-11-01: 300 mg via ORAL

## 2015-11-01 MED ORDER — HEPARIN (PORCINE) IN NACL 2-0.9 UNIT/ML-% IJ SOLN
INTRAMUSCULAR | Status: AC
  Filled 2015-11-01: qty 1000

## 2015-11-01 MED ORDER — CLOPIDOGREL BISULFATE 300 MG PO TABS
ORAL_TABLET | ORAL | Status: AC
  Filled 2015-11-01: qty 1

## 2015-11-01 SURGICAL SUPPLY — 18 items
BALLN EUPHORA RX 2.0X10 (BALLOONS) ×3
BALLN ~~LOC~~ TREK RX 2.5X8 (BALLOONS) ×3
BALLOON EUPHORA RX 2.0X10 (BALLOONS) IMPLANT
BALLOON ~~LOC~~ TREK RX 2.5X8 (BALLOONS) IMPLANT
CATH INFINITI 5 FR JL3.5 (CATHETERS) ×2 IMPLANT
CATH INFINITI JR4 5F (CATHETERS) ×2 IMPLANT
DEVICE RAD COMP TR BAND LRG (VASCULAR PRODUCTS) ×2 IMPLANT
GLIDESHEATH SLEND SS 6F .021 (SHEATH) ×2 IMPLANT
GUIDE CATH RUNWAY 6FR CLS3.5 (CATHETERS) ×2 IMPLANT
KIT ENCORE 26 ADVANTAGE (KITS) ×2 IMPLANT
KIT HEART LEFT (KITS) ×3 IMPLANT
PACK CARDIAC CATHETERIZATION (CUSTOM PROCEDURE TRAY) ×3 IMPLANT
STENT PROMUS PREM MR 2.25X12 (Permanent Stent) ×2 IMPLANT
TRANSDUCER W/STOPCOCK (MISCELLANEOUS) ×3 IMPLANT
TUBING CIL FLEX 10 FLL-RA (TUBING) ×3 IMPLANT
WIRE ASAHI PROWATER 180CM (WIRE) ×2 IMPLANT
WIRE RUNTHROUGH .014X180CM (WIRE) ×2 IMPLANT
WIRE SAFE-T 1.5MM-J .035X260CM (WIRE) ×2 IMPLANT

## 2015-11-01 NOTE — Progress Notes (Signed)
Patient ID: [name] [name], male   DOB: 04/06/1946, 70 y.o.   MRN: [id number]     Advanced Heart Failure Rounding Note  Primary Physician: [name], MD Primary Cardiologist: [place] Electrophysiologist: Caryl Comes   Reason for Consultation: ICD shocks  Subjective:    Admitted to ICU 10/27/15 after being found down at home with ? CVA findings. Intubated for airway protection with vomiting.  Had VT PTA with ICD therapy. Extubated 7/8.   EEG- no seizure activity. Started on keppra by Neurology.  ECHO- 10/29/15 EF 15-20%. Diffuse HK. Akinesis apical myocardium. Grade II DD.   Denies SOB/CP.  Still with intermittent confusion.  Creatinine stable at 1.4 today.   LHC done today => progressive distal dominant LCx stenosis is likely source for VT and NSTEMI.    Objective:   Weight Range: 165 lb (74.844 kg) Body mass index is 21.18 kg/(m^2).   Vital Signs:   Temp:  [97.4 F (36.3 C)-98.4 F (36.9 C)] 98.4 F (36.9 C) (07/11 0302) Pulse Rate:  [89-103] 103 (07/11 0710) Resp:  [19-31] 28 (07/11 0710) BP: (109-127)/(76-94) 123/88 mmHg (07/11 0710) SpO2:  [93 %-98 %] 95 % (07/11 0710) Weight:  [165 lb (74.844 kg)] 165 lb (74.844 kg) (07/11 0400) Last BM Date:  (unknown PTA?)  Weight change: Filed Weights   10/31/15 0336 10/31/15 0718 11/01/15 0400  Weight: 160 lb 4.4 oz (72.7 kg) 164 lb 6.4 oz (74.571 kg) 165 lb (74.844 kg)    Intake/Output:   Intake/Output Summary (Last 24 hours) at 11/01/15 0857 Last data filed at 11/01/15 0710  Gross per 24 hour  Intake 979.94 ml  Output   1225 ml  Net -245.06 ml     Physical Exam: General: NAD. In bed.  HEENT: Normal  Neck: supple. JVP 7-8. Carotids 2+ bilat; no bruits. No thyromegaly or nodule noted. Cor: PMI nondisplaced. Regular rate & rhythm. Regular rate and rhythm, 2/6 systolic murmur  Lungs: Mechanical breath sounds.  Abdomen: soft, NT, ND, no HSM. No bruits or masses. +BS  Extremities: no cyanosis, clubbing, rash, edema. R  and LUE mittens.  No DP pulse appreciated either side.  Neuro: alert & oriented x person, cranial nerves grossly intact. moves all 4 extremities w/o difficulty. Affect pleasant  Telemetry: Sinus Tach 110s   Labs: CBC  Recent Labs  10/31/15 0527 11/01/15 0410  WBC 11.9* 10.0  HGB 11.5* 10.1*  HCT 35.2* 31.7*  MCV 87.6 87.8  PLT 205 0000000   Basic Metabolic Panel  Recent Labs  10/29/15 1601  10/31/15 0527 11/01/15 0410  NA  --   < > 143 141  K  --   < > 3.6 3.9  CL  --   < > 116* 114*  CO2  --   < > 20* 22  GLUCOSE  --   < > 146* 150*  BUN  --   < > 18 19  CREATININE  --   < > 1.44* 1.46*  CALCIUM  --   < > 8.3* 8.0*  MG 2.1  --   --  1.8  PHOS 2.1*  --   --   --   < > = values in this interval not displayed. Liver Function Tests  Recent Labs  11/01/15 0410  AST 21  ALT 15*  ALKPHOS 140*  BILITOT 1.0  PROT 5.4*  ALBUMIN 2.1*   No results for input(s): LIPASE, AMYLASE in the last 72 hours. Cardiac Enzymes No results for input(s): CKTOTAL, CKMB, CKMBINDEX,  TROPONINI in the last 72 hours.  BNP: BNP (last 3 results)  Recent Labs  04/11/15 0400 07/26/15 1707 10/27/15 1124  BNP 2568.2* 2061.3* 1318.0*    ProBNP (last 3 results) No results for input(s): PROBNP in the last 8760 hours.   D-Dimer No results for input(s): DDIMER in the last 72 hours. Hemoglobin A1C No results for input(s): HGBA1C in the last 72 hours. Fasting Lipid Panel No results for input(s): CHOL, HDL, LDLCALC, TRIG, CHOLHDL, LDLDIRECT in the last 72 hours. Thyroid Function Tests No results for input(s): TSH, T4TOTAL, T3FREE, THYROIDAB in the last 72 hours.  Invalid input(s): FREET3  Other results:     Imaging/Studies:  No results found.  Latest Echo  Latest Cath   Medications:     Scheduled Medications: . [MAR Hold] amiodarone  400 mg Oral BID  . [MAR Hold] ampicillin-sulbactam (UNASYN) IV  1.5 g Intravenous Q6H  . [MAR Hold] antiseptic oral rinse  7 mL Mouth  Rinse QID  . [MAR Hold] aspirin  81 mg Oral Daily  . [MAR Hold] carvedilol  3.125 mg Oral BID WC  . [MAR Hold] clopidogrel  75 mg Oral Daily  . [MAR Hold] digoxin  0.125 mg Oral Daily  . [MAR Hold] enoxaparin (LOVENOX) injection  40 mg Subcutaneous Daily  . [MAR Hold] insulin aspart  0-5 Units Subcutaneous QHS  . [MAR Hold] insulin aspart  0-9 Units Subcutaneous TID WC  . [MAR Hold] insulin glargine  10 Units Subcutaneous Daily  . [MAR Hold] mirtazapine  15 mg Oral QHS  . [MAR Hold] rosuvastatin  40 mg Per Tube q1800  . [MAR Hold] sodium chloride  1,000 mL Intravenous Once  . [MAR Hold] spironolactone  12.5 mg Oral Daily  . [MAR Hold] tamsulosin  0.4 mg Oral Daily        Infusions: . sodium chloride 10 mL/hr at 11/01/15 0844  . heparin      PRN Medications: bivalirudin, fentaNYL, heparin, heparin, iopamidol, lidocaine (PF), Radial Cocktail/Verapamil only, [MAR Hold] RESOURCE THICKENUP CLEAR   Assessment   1. VT 2. NSTEMI 3. Chronic systolic HF: St Jude ICD.  4. CAD 5. ?CVA  6. Sepsis 7. Acute respiratory failure requiring intubation: Suspect aspiration pneumonitis/PNA.  8. UTI 9. Seizures   Plan   He has had a large NSTEMI with VT.  LHC today showed progressive lesion in distal LCx (dominant vessel).  Diffuse LAD disease and subtotal nondominant RCA occlusion similar to prior.  LAD territory nonviable on prior cMRI.  Plan intervention to distal LCx today.   - Will continue Plavix/ASA/statin.   No further VT, now on po amiodarone.  Echo completed 7/8 with severely reduced EF 15-20%, grade II DD. Diffuse HK. This is similar to the past echo. Good co-ox yesterday.  LVEDP 22 on cath today.  - Continue current Coreg, spironolactone, and digoxin.  Digoxin level ok today.  - If creatinine ok tomorrow, will start on low dose ACEI.  - May need Lasix po in future but will hold today with contrast.   ? Probable seizures- Neurology following. EEG no seizure activity. On  keppra 500 mg twice a day.   Length of Stay: [place]  11/01/2015, 8:57 AM  Advanced Heart Failure Team Pager [phone] (M-F; 7a - 4p)  Please contact [facility] for night-coverage after hours (4p -7a ) and weekends on [email or web address]

## 2015-11-01 NOTE — Progress Notes (Signed)
Inpatient Diabetes Program Recommendations  AACE/ADA: New Consensus Statement on Inpatient Glycemic Control (2015)  Target Ranges:  Prepandial:   less than 140 mg/dL      Peak postprandial:   less than 180 mg/dL (1-2 hours)      Critically ill patients:  140 - 180 mg/dL   Results for [name] [name], [name] [name] (MRN [id number]) as of 11/01/2015 10:35  Ref. Range 10/31/2015 03:53 10/31/2015 08:15 10/31/2015 11:32 10/31/2015 16:50 10/31/2015 21:03  Glucose-Capillary Latest Ref Range: 65-99 mg/dL 140 (H) 157 (H) 165 (H) 212 (H) 247 (H)   Review of Glycemic Control  Diabetes history: DM2 Outpatient Diabetes medications: Lantus 20 units QHS, Novolog 5-15 units TID with meals Current orders for Inpatient glycemic control: Lantus 10 units daily, Novolog 0-9 units TID with meals, Novolog 0-5 units QHS  Inpatient Diabetes Program Recommendations:. Insulin - Meal Coverage: Please consider ordering Novolog 3 units TID with meals for meal coverage if patient eats at least 50% of meals.  Thanks, Barnie Alderman, RN, MSN, CDE Diabetes Coordinator Inpatient Diabetes Program [phone] (Team Pager from [place] to [place]) [phone] ([facility] office) [phone] [facility] office) [phone] [facility] office)

## 2015-11-01 NOTE — Progress Notes (Signed)
Received pt from cath lab at 1115. Prior documentation on restraints for 0800 and 1000 were not documented on (pt was in cath lab at that time). Will continue to monitor closely.

## 2015-11-01 NOTE — Progress Notes (Signed)
PROGRESS NOTE    [name] [name]  [id number] DOB: [birth year] DOA: 10/27/2015 PCP: [name], MD   Brief Narrative:  69 yo WM PMHx CVA 2013, HTN,Chronic combined systolic and diastolic heart failure (EF 15%), Ischemic cardiomyopathy, CAD native artery S/P Automatic implantable cardioverter-defibrillator,NSVT, MI, OSA, DM Type 2  Who presents to [facility] ED with 24 hours of increased confusion, rt arm tremor, agitation. He vomited and most likely aspirated 7/6 and will require intubation for airway protection. Neuro and Cards will see patient, note he had VT in ER and IACD fired. We will admit to ICU, treat with abx,do nuero and cardiology work ups. He is a full code per wife and family   Assessment & Plan:   Active Problems:   Aspiration pneumonia (HCC)   Respiratory failure (HCC)   NSTEMI (non-ST elevated myocardial infarction) ([place])   Encounter for central line placement   Acute on chronic respiratory failure with hypoxia (HCC)   Acute on chronic diastolic congestive heart failure (HCC)   PAH (pulmonary artery hypertension) (HCC)   Aspiration pneumonia of right lower lobe due to vomit (HCC)   Systolic and diastolic CHF, chronic (HCC)   HLD (hyperlipidemia)   Acute encephalopathy   Acute renal failure superimposed on stage 3 chronic kidney disease ([place])   Uncontrolled type 2 diabetes mellitus with complication (HCC)   Acute Hypoxic Respiratory Failure Secondary to aspiration pneumonia -Resolved  Large NSTEMI -Peak Troponin > 65  -7/11 Left heart cath multivessel disease with stent placement see results below  Ischemic Cardiomyopathy w/ Systolic and Diastolic CHF -7/8 echocardiogram EF remains 0000000, and diastolic CHF see results below -Strict in and out since admission +5.9 L -Daily weight Filed Weights   10/31/15 0336 10/31/15 0718 11/01/15 0400  Weight: 72.7 kg (160 lb 4.4 oz) 74.571 kg (164 lb 6.4 oz) 74.844 kg (165 lb)    Ventricular Tachycardia - IACD Fired in  ED 7/6 -Amiodarone 400 mg  BID -Coreg 3.125 mg BID -Digoxin 0.125 mg daily  -Spironolactone 12.5 mg daily  Hyperlipidemia -Crestor 40 mg daily   Probable Partial Seizure -Not on seizure medication    Acute Encephalopathy -Resolved: Although per nursing waxes and wanes  -Restraints PRN  Possible New Acute CVA - H/O CVA 2015 - Mild right-sided weakness & slurred speech -Resolved   Acute on CKD III( baseline Cr 1.7-2.2) Lab Results  Component Value Date   CREATININE 1.46* 11/01/2015   CREATININE 1.44* 10/31/2015   CREATININE 1.20 10/30/2015  -Within baseline  Nutrition - Probable Aspiration -Speech Eval has cleared for D3 with thin liquid diet  Anemia Mild - no signs of active bleeding  Positive Klebsiella UTI -Complete 5 days of antibiotics    DM Type 2 uncontrolled with, complication 7/6 Hemoglobin A1c 12.0  -Increase Lantus 15 units daily -Continue sensitive SSI   DVT prophylaxis: Lovenox Code Status: Full Family Communication: None Disposition Plan: Per cardiology   Consultants:  [place] [facility]   Procedures/Significant Events:  7/6 - admitted - V tach in the ED, AICD fired - intubated 7/8 - extubated 7/8 echocardiogram:- Left ventricle: severely dilated.-LVEF= 15% to 20%. Severe diffuse hypokinesis. Akinesis of the apical myocardium. - (grade 2 diastolic dysfunction). - Left atrium: moderately dilated.- Pulmonary arteries: PA peak pressure: 57 mm Hg (S). 7/10 - TRH assumed care 7/11 left heart cath:-LAD: 1. Prox LAD to Mid LAD lesion, 90% stenosed. 2. Lat 1st Diag lesion, 90% stenosed. 3. Ost 3rd Mrg to 3rd Mrg lesion, 95% stenosed. 4.  Ost 2nd Mrg to 2nd Mrg lesion, 60% stenosed. 5. Ost Ramus to Ramus lesion, 90% stenosed. 6. Ost 4th Mrg to 4th Mrg lesion, 70% stenosed. 7. Mid RCA lesion, 100% stenosed. 8. Dist Cx lesion, 90% stenosed. S/P placement Stent Promus Prem Mr [id number] [phone] there is a 0% residual  stenosis   Cultures 7/6 urine positive Klebsiella OXYTOCA 7/6 blood left wrist/hand negative 7/7 positive rare Candida albicans   Antimicrobials: Unasyn 7/6>> 7/11   Devices NA   LINES / TUBES:  Left IJ CVL    Continuous Infusions: . sodium chloride 10 mL/hr at 11/01/15 0844     Subjective: 7/11 A/O 4, NAD. States ICD did not shock camp but company called to let him know it had fired.    Objective: Filed Vitals:   11/01/15 1600 11/01/15 1700 11/01/15 1800 11/01/15 1900  BP: 111/74 112/85 109/72 120/84  Pulse: 100 96 92 98  Temp:      TempSrc:      Resp: 19 23 17 24   Height:      Weight:      SpO2: 94% 95% 96% 98%    Intake/Output Summary (Last 24 hours) at 11/01/15 2026 Last data filed at 11/01/15 1900  Gross per 24 hour  Intake 766.05 ml  Output   1900 ml  Net -1133.95 ml   Filed Weights   10/31/15 0336 10/31/15 0718 11/01/15 0400  Weight: 72.7 kg (160 lb 4.4 oz) 74.571 kg (164 lb 6.4 oz) 74.844 kg (165 lb)    Examination:  General:A/O 4, NAD., No acute respiratory distress Eyes: negative scleral hemorrhage, negative anisocoria, negative icterus ENT: Negative Runny nose, negative gingival bleeding, Neck:  Negative scars, masses, torticollis, lymphadenopathy, JVD, left IJ in place Lungs: Clear to auscultation bilaterally without wheezes or crackles Cardiovascular: Regular rate and rhythm positive systolic murmur , negative gallop or rub normal S1 and S2 Abdomen: negative abdominal pain, nondistended, positive soft, bowel sounds, no rebound, no ascites, no appreciable mass Extremities: No significant cyanosis, clubbing, or edema bilateral lower extremities Skin: Negative rashes, lesions, ulcers Psychiatric:  Negative depression, negative anxiety, negative fatigue, negative mania  Central nervous system:  Cranial nerves II through XII intact, tongue/uvula midline, all extremities muscle strength 5/5, sensation intact throughout,  negative  dysarthria, negative expressive aphasia, negative receptive aphasia.  .     Data Reviewed: Care during the described time interval was provided by me .  I have reviewed this patient's available data, including medical history, events of note, physical examination, and all test results as part of my evaluation. I have personally reviewed and interpreted all radiology studies.  CBC:  Recent Labs Lab 10/27/15 0757  10/28/15 0420 10/29/15 0459 10/30/15 0500 10/31/15 0527 11/01/15 0410  WBC 18.3*  < > 12.2* 12.0* 14.6* 11.9* 10.0  NEUTROABS 16.6*  --   --   --   --   --   --   HGB 12.0*  < > 10.3* 10.9* 11.5* 11.5* 10.1*  HCT 38.0*  < > 31.7* 35.3* 36.4* 35.2* 31.7*  MCV 88.2  < > 88.5 91.2 91.0 87.6 87.8  PLT 224  < > 151 168 179 205 176  < > = values in this interval not displayed. Basic Metabolic Panel:  Recent Labs Lab 10/27/15 1124 10/28/15 0420 10/28/15 1250 10/28/15 1736 10/29/15 0459 10/29/15 1601 10/30/15 0500 10/31/15 0527 11/01/15 0410  NA 139 143  --   --  141  --  144 143 141  K 5.6*  3.9  --   --  4.0  --  3.8 3.6 3.9  CL 106 114*  --   --  114*  --  116* 116* 114*  CO2 24 25  --   --  22  --  21* 20* 22  GLUCOSE 457* 129*  --   --  301*  --  122* 146* 150*  BUN 25* 20  --   --  19  --  16 18 19   CREATININE 1.61* 1.31*  --   --  1.21  --  1.20 1.44* 1.46*  CALCIUM 8.2* 8.0*  --   --  7.8*  --  8.1* 8.3* 8.0*  MG 1.8  --  1.8 2.4 2.2 2.1  --   --  1.8  PHOS 3.1  --  2.9 2.9 2.4* 2.1*  --   --   --    GFR: Estimated Creatinine Clearance: 50.5 mL/min (by C-G formula based on Cr of 1.46). Liver Function Tests:  Recent Labs Lab 10/27/15 0757 10/27/15 1124 11/01/15 0410  AST 35 60* 21  ALT 27 27 15*  ALKPHOS 142* 125 140*  BILITOT 0.8 1.3* 1.0  PROT 7.2 6.3* 5.4*  ALBUMIN 3.2* 2.8* 2.1*   No results for input(s): LIPASE, AMYLASE in the last 168 hours. No results for input(s): AMMONIA in the last 168 hours. Coagulation Profile:  Recent Labs Lab  10/27/15 0757 10/27/15 1124  INR 1.10 1.20   Cardiac Enzymes:  Recent Labs Lab 10/27/15 0757 10/27/15 1124 10/27/15 1530 10/27/15 2047  TROPONINI 1.36* 20.31* >65.00* >65.00*   BNP (last 3 results) No results for input(s): PROBNP in the last 8760 hours. HbA1C: No results for input(s): HGBA1C in the last 72 hours. CBG:  Recent Labs Lab 10/31/15 1132 10/31/15 1650 10/31/15 2103 11/01/15 1112 11/01/15 1541  GLUCAP 165* 212* 247* 141* 253*   Lipid Profile: No results for input(s): CHOL, HDL, LDLCALC, TRIG, CHOLHDL, LDLDIRECT in the last 72 hours. Thyroid Function Tests: No results for input(s): TSH, T4TOTAL, FREET4, T3FREE, THYROIDAB in the last 72 hours. Anemia Panel: No results for input(s): VITAMINB12, FOLATE, FERRITIN, TIBC, IRON, RETICCTPCT in the last 72 hours. Urine analysis:    Component Value Date/Time   COLORURINE YELLOW 10/27/2015 0900   APPEARANCEUR CLOUDY* 10/27/2015 0900   LABSPEC 1.028 10/27/2015 0900   PHURINE 5.0 10/27/2015 0900   GLUCOSEU >1000* 10/27/2015 0900   HGBUR SMALL* 10/27/2015 0900   BILIRUBINUR NEGATIVE 10/27/2015 0900   BILIRUBINUR n 12/25/2011 1325   KETONESUR NEGATIVE 10/27/2015 0900   PROTEINUR NEGATIVE 10/27/2015 0900   PROTEINUR 3+ 12/25/2011 1325   UROBILINOGEN 0.2 12/25/2011 1325   UROBILINOGEN 0.2 09/06/2011 0908   NITRITE POSITIVE* 10/27/2015 0900   NITRITE n 12/25/2011 1325   LEUKOCYTESUR SMALL* 10/27/2015 0900   Sepsis Labs: @LABRCNTIP (procalcitonin:4,lacticidven:4)  ) Recent Results (from the past 240 hour(s))  Blood culture (routine x 2)     Status: None   Collection Time: 10/27/15  8:57 AM  Result Value Ref Range Status   Specimen Description BLOOD LEFT FOREARM  Final   Special Requests BOTTLES DRAWN AEROBIC ONLY  8CC  Final   Culture NO GROWTH 5 DAYS  Final   Report Status 11/01/2015 FINAL  Final  Blood culture (routine x 2)     Status: None   Collection Time: 10/27/15  8:59 AM  Result Value Ref Range  Status   Specimen Description BLOOD RIGHT ANTECUBITAL  Final   Special Requests BOTTLES DRAWN AEROBIC AND ANAEROBIC  5CC  Final   Culture NO GROWTH 5 DAYS  Final   Report Status 11/01/2015 FINAL  Final  Urine culture     Status: Abnormal   Collection Time: 10/27/15  9:00 AM  Result Value Ref Range Status   Specimen Description URINE, CATHETERIZED  Final   Special Requests Normal  Final   Culture >=100,000 COLONIES/mL KLEBSIELLA OXYTOCA (A)  Final   Report Status 10/29/2015 FINAL  Final   Organism ID, Bacteria KLEBSIELLA OXYTOCA (A)  Final      Susceptibility   Klebsiella oxytoca - MIC*    AMPICILLIN 16 RESISTANT Resistant     CEFAZOLIN <=4 SENSITIVE Sensitive     CEFTRIAXONE <=1 SENSITIVE Sensitive     CIPROFLOXACIN <=0.25 SENSITIVE Sensitive     GENTAMICIN <=1 SENSITIVE Sensitive     IMIPENEM <=0.25 SENSITIVE Sensitive     NITROFURANTOIN <=16 SENSITIVE Sensitive     TRIMETH/SULFA <=20 SENSITIVE Sensitive     AMPICILLIN/SULBACTAM 4 SENSITIVE Sensitive     PIP/TAZO <=4 SENSITIVE Sensitive     * >=100,000 COLONIES/mL KLEBSIELLA OXYTOCA  MRSA PCR Screening     Status: None   Collection Time: 10/27/15 11:28 AM  Result Value Ref Range Status   MRSA by PCR NEGATIVE NEGATIVE Final    Comment:        The GeneXpert MRSA Assay (FDA approved for NASAL specimens only), is one component of a comprehensive MRSA colonization surveillance program. It is not intended to diagnose MRSA infection nor to guide or monitor treatment for MRSA infections.   Culture, blood (routine x 2)     Status: None   Collection Time: 10/27/15 11:39 AM  Result Value Ref Range Status   Specimen Description BLOOD LEFT WRIST  Final   Special Requests BOTTLES DRAWN AEROBIC ONLY 5CC  Final   Culture NO GROWTH 5 DAYS  Final   Report Status 11/01/2015 FINAL  Final  Culture, blood (routine x 2)     Status: None   Collection Time: 10/27/15 11:44 AM  Result Value Ref Range Status   Specimen Description BLOOD LEFT  HAND  Final   Special Requests IN PEDIATRIC BOTTLE 1CC  Final   Culture NO GROWTH 5 DAYS  Final   Report Status 11/01/2015 FINAL  Final  Culture, respiratory (NON-Expectorated)     Status: None   Collection Time: 10/28/15  1:05 PM  Result Value Ref Range Status   Specimen Description TRACHEAL ASPIRATE  Final   Special Requests Normal  Final   Gram Stain   Final    ABUNDANT WBC PRESENT, PREDOMINANTLY PMN NO SQUAMOUS EPITHELIAL CELLS SEEN NO ORGANISMS SEEN    Culture RARE CANDIDA ALBICANS  Final   Report Status 10/31/2015 FINAL  Final         Radiology Studies: No results found.      Scheduled Meds: . amiodarone  400 mg Oral BID  . ampicillin-sulbactam (UNASYN) IV  1.5 g Intravenous Q6H  . antiseptic oral rinse  7 mL Mouth Rinse QID  . aspirin  81 mg Oral Daily  . carvedilol  3.125 mg Oral BID WC  . clopidogrel  75 mg Oral Daily  . digoxin  0.125 mg Oral Daily  . enoxaparin (LOVENOX) injection  40 mg Subcutaneous Daily  . insulin aspart  0-5 Units Subcutaneous QHS  . insulin aspart  0-9 Units Subcutaneous TID WC  . [START ON 11/02/2015] insulin glargine  15 Units Subcutaneous Daily  . mirtazapine  15 mg Oral QHS  .  rosuvastatin  40 mg Per Tube q1800  . sodium chloride  1,000 mL Intravenous Once  . sodium chloride flush  3 mL Intravenous Q12H  . spironolactone  12.5 mg Oral Daily  . tamsulosin  0.4 mg Oral Daily   Continuous Infusions: . sodium chloride 10 mL/hr at 11/01/15 0844     LOS: 5 days    Time spent: 40 minutes    WOODS, Geraldo Docker, MD [facility] Pager [phone]   If 7PM-7AM, please contact night-coverage [email or web address] Password [facility] 11/01/2015, 8:26 PM

## 2015-11-01 NOTE — Interval H&P Note (Signed)
History and Physical Interval Note:  11/01/2015 7:46 AM  [name]  has presented today for surgery, with the diagnosis of hf  The various methods of treatment have been discussed with the patient and family. After consideration of risks, benefits and other options for treatment, the patient has consented to  Procedure(s): Left Heart Cath and Coronary Angiography (N/A) as a surgical intervention .  The patient's history has been reviewed, patient examined, no change in status, stable for surgery.  I have reviewed the patient's chart and labs.  Questions were answered to the patient's satisfaction.     Ashlynd Michna [employer]

## 2015-11-01 NOTE — Progress Notes (Signed)
Report given to [place], 2H07. Transfer pending.

## 2015-11-01 NOTE — Progress Notes (Signed)
PT Cancellation Note  Patient Details Name: NESANEL NYKAZA MRN: [id number] DOB: 1946-03-05   Cancelled Treatment:    Reason Eval/Treat Not Completed: Patient at procedure or test/unavailable.  Pt at cardiac cath.  PT to check back later as time allows.  Thanks,    Barbarann Ehlers. Ollie, Jordan, DPT [phone]   11/01/2015, 8:50 AM

## 2015-11-01 NOTE — Progress Notes (Signed)
Pt in cath holding awaiting room assignment. Sleeping . Will not awaken him in that he was reported to be combative in procedure. Family is at bedside.

## 2015-11-01 NOTE — H&P (View-Only) (Signed)
Advanced Heart Failure Rounding Note  Primary Physician: [name], MD Primary Cardiologist: Comfort Electrophysiologist: Caryl Comes   Reason for Consultation: ICD shocks  Subjective:    Admitted to ICU 10/27/15 after being found down at home with ? CVA findings. Intubated for airway protection with vomiting.  Had VT PTA with HV therapy. Extubated 7/8.   EEG- no seizure activity. Started on keppra by Neurology.  ECHO- 10/29/15 EF 15-20%. Diffuse HK. Akinesis apical myocardium. Grade II DD.   Denies SOB/CP.    Objective:   Weight Range: 160 lb 4.4 oz (72.7 kg) Body mass index is 20.57 kg/(m^2).   Vital Signs:   Temp:  [97.8 F (36.6 C)-98.6 F (37 C)] 97.8 F (36.6 C) (07/10 0718) Pulse Rate:  [71-116] 114 (07/10 0718) Resp:  [26-34] 31 (07/10 0718) BP: (103-139)/(69-102) 126/87 mmHg (07/10 0718) SpO2:  [92 %-100 %] 92 % (07/10 0718) Weight:  [160 lb 4.4 oz (72.7 kg)] 160 lb 4.4 oz (72.7 kg) (07/10 0336) Last BM Date:  (unknown PTA?)  Weight change: Filed Weights   10/29/15 0412 10/30/15 0443 10/31/15 0336  Weight: 153 lb 11.2 oz (69.718 kg) 160 lb 4.4 oz (72.7 kg) 160 lb 4.4 oz (72.7 kg)    Intake/Output:   Intake/Output Summary (Last 24 hours) at 10/31/15 0827 Last data filed at 10/31/15 0700  Gross per 24 hour  Intake 1599.6 ml  Output    845 ml  Net  754.6 ml     Physical Exam: General: NAD. In bed.  HEENT: Normal  Neck: supple. JVP 5-6 Carotids 2+ bilat; no bruits. No thyromegaly or nodule noted. Cor: PMI nondisplaced. Regular rate & rhythm. Regular rate and rhythm, 2/6 systolic murmur  Lungs: Mechanical breath sounds.  Abdomen: soft, NT, ND, no HSM. No bruits or masses. +BS  Extremities: no cyanosis, clubbing, rash, edema. R and LUE mittens.  No DP pulse appreciated either side.  Neuro: alert & oriented x person, cranial nerves grossly intact. moves all 4 extremities w/o difficulty. Affect pleasant  Telemetry: Sinus Tach 110s    Labs: CBC  Recent Labs  10/30/15 0500 10/31/15 0527  WBC 14.6* 11.9*  HGB 11.5* 11.5*  HCT 36.4* 35.2*  MCV 91.0 87.6  PLT 179 597   Basic Metabolic Panel  Recent Labs  10/29/15 0459 10/29/15 1601 10/30/15 0500 10/31/15 0527  NA 141  --  144 143  K 4.0  --  3.8 3.6  CL 114*  --  116* 116*  CO2 22  --  21* 20*  GLUCOSE 301*  --  122* 146*  BUN 19  --  16 18  CREATININE 1.21  --  1.20 1.44*  CALCIUM 7.8*  --  8.1* 8.3*  MG 2.2 2.1  --   --   PHOS 2.4* 2.1*  --   --    Liver Function Tests No results for input(s): AST, ALT, ALKPHOS, BILITOT, PROT, ALBUMIN in the last 72 hours. No results for input(s): LIPASE, AMYLASE in the last 72 hours. Cardiac Enzymes No results for input(s): CKTOTAL, CKMB, CKMBINDEX, TROPONINI in the last 72 hours.  BNP: BNP (last 3 results)  Recent Labs  04/11/15 0400 07/26/15 1707 10/27/15 1124  BNP 2568.2* 2061.3* 1318.0*    ProBNP (last 3 results) No results for input(s): PROBNP in the last 8760 hours.   D-Dimer No results for input(s): DDIMER in the last 72 hours. Hemoglobin A1C No results for input(s): HGBA1C in the last 72 hours. Fasting Lipid Panel No  results for input(s): CHOL, HDL, LDLCALC, TRIG, CHOLHDL, LDLDIRECT in the last 72 hours. Thyroid Function Tests No results for input(s): TSH, T4TOTAL, T3FREE, THYROIDAB in the last 72 hours.  Invalid input(s): FREET3  Other results:     Imaging/Studies:  No results found.  Latest Echo  Latest Cath   Medications:     Scheduled Medications: . ampicillin-sulbactam (UNASYN) IV  1.5 g Intravenous Q6H  . antiseptic oral rinse  7 mL Mouth Rinse QID  . aspirin  325 mg Per Tube Daily  . carvedilol  3.125 mg Oral BID WC  . chlorhexidine gluconate (SAGE KIT)  15 mL Mouth Rinse BID  . insulin aspart  0-9 Units Subcutaneous Q4H  . insulin glargine  10 Units Subcutaneous Daily  . rosuvastatin  40 mg Per Tube q1800  . sodium chloride  1,000 mL Intravenous Once  .  tamsulosin  0.4 mg Oral Daily        Infusions: . sodium chloride 10 mL/hr at 10/30/15 1059  . amiodarone 30 mg/hr (10/31/15 0559)  . heparin 1,550 Units/hr (10/30/15 1630)    PRN Medications: acetone (urine) test, RESOURCE THICKENUP CLEAR   Assessment   1. VT 2. NSTEMI 3. Chronic systolic HF: St Jude ICD.  4. CAD 5. ?CVA  6. Sepsis 7. Acute respiratory failure requiring intubation: Suspect aspiration.  8. UTI 9. Seizures   Plan   He has had a large NSTEMI. Remains on heparin drip and amio drip with VT and NSTEMI. Goal K > 4.0 and Mg > 2.0. Give extra 40 meq K today. Check mag tomorrow.   No further VT on IV amio. Stop heparin. Continue low dose b-blocker   Will need to consider cath this week however last cath in 11/2013 demonstrated triple vessel coronary artery disease. With 99%p LAD (s/p previous anterior MI), LCX 95% and 100% non-dominant RCA. There was consideration of high risk revascularization of the circumflex lesion. However, stress viability study which demonstrated no evidence of viability. So options may be limited. Dr Angelena Form plans to review today.  ECHO completed 7/8 with severely reduced EF 15-20%, grade II DD. Diffuse HK. This is similar to the past. Continue carvedilol. Add 12.5 mg spiro daily and add digoxin 0.125 mg daily (was on at home). Check CO-OX now. Hold off on ARB for now.   ? Probable seizures- Neurology following. EEG no seizure activity. On keppra 500 mg twice a day.   Length of Stay: 4   Larita Deremer NP-C   10/31/2015, 8:27 AM  Advanced Heart Failure Team Pager [phone] (M-F; 7a - 4p)  Please contact [facility] for night-coverage after hours (4p -7a ) and weekends on [email or web address]  Patient seen with NP, agree with the above note.  Admitted with aspiration PNA, VT with ICD shock.  Troponin markedly elevated.   Now on amiodarone gtt, can transition to po today.  He had dense anterior scar on prior cardiac MRI, thought to be nonviable,  so at risk for scar-related VT.  However, troponin increased to > 65 so could be VT related to NSTEMI with new plaque rupture.  - Can transition to po amiodarone today.   Has extensive past history of CAD evaluation though it has been 2 years since last cath.  He had 3VD with nonviable anterior wall in the past (on cardiac MRI), decided against high risk LCx intervention in the past.   - Will continue ASA and Crestor. - Start Plavix  - Can stop heparin (has been on >  48 hrs, no further chest pain).  - Will review cath films with interventionalist prior to repeat cath as there may not be any good revascularization options.   Echo showed EF comparable to the past, 15-20%. He is on Coreg, restart digoxin and spironolactone today.  Will add ARB if creatinine remains stable.  Not volume overloaded on exam.   Loralie Champagne 10/31/2015 9:17 AM

## 2015-11-02 ENCOUNTER — Encounter (HOSPITAL_COMMUNITY)
Admission: EM | Disposition: A | Discharge status: Skilled Nursing Facility | Payer: Self-pay | Source: Home / Self Care | Attending: Internal Medicine

## 2015-11-02 DIAGNOSIS — G9340 Encephalopathy, unspecified: Secondary | ICD-10-CM

## 2015-11-02 DIAGNOSIS — I5043 Acute on chronic combined systolic (congestive) and diastolic (congestive) heart failure: Secondary | ICD-10-CM

## 2015-11-02 LAB — BASIC METABOLIC PANEL
Anion gap: 5 (ref 5–15)
BUN: 16 mg/dL (ref 6–20)
CHLORIDE: 114 mmol/L — AB (ref 101–111)
CO2: 22 mmol/L (ref 22–32)
CREATININE: 1.55 mg/dL — AB (ref 0.61–1.24)
Calcium: 7.9 mg/dL — ABNORMAL LOW (ref 8.9–10.3)
GFR calc Af Amer: 51 mL/min — ABNORMAL LOW (ref >60)
GFR calc non Af Amer: 44 mL/min — ABNORMAL LOW (ref >60)
GLUCOSE: 172 mg/dL — AB (ref 65–99)
Potassium: 4 mmol/L (ref 3.5–5.1)
SODIUM: 141 mmol/L (ref 135–145)

## 2015-11-02 LAB — CARBOXYHEMOGLOBIN
CARBOXYHEMOGLOBIN: 1.9 % — AB (ref 0.5–1.5)
Carboxyhemoglobin: 1.4 % (ref 0.5–1.5)
Methemoglobin: 0.9 % (ref 0.0–1.5)
Methemoglobin: 0.6 % (ref 0.0–1.5)
O2 SAT: 70.1 %
O2 Saturation: 96.7 %
Total hemoglobin: 9.5 g/dL — ABNORMAL LOW (ref 13.5–18.0)
Total hemoglobin: 9.3 g/dL — ABNORMAL LOW (ref 13.5–18.0)

## 2015-11-02 LAB — GLUCOSE, CAPILLARY
GLUCOSE-CAPILLARY: 96 mg/dL (ref 65–99)
GLUCOSE-CAPILLARY: 194 mg/dL — AB (ref 65–99)
GLUCOSE-CAPILLARY: 216 mg/dL — AB (ref 65–99)
Glucose-Capillary: 184 mg/dL — ABNORMAL HIGH (ref 65–99)
Glucose-Capillary: 249 mg/dL — ABNORMAL HIGH (ref 65–99)

## 2015-11-02 LAB — CBC
HCT: 30.9 % — ABNORMAL LOW (ref 39.0–52.0)
Hemoglobin: 9.6 g/dL — ABNORMAL LOW (ref 13.0–17.0)
MCH: 27.7 pg (ref 26.0–34.0)
MCHC: 31.1 g/dL (ref 30.0–36.0)
MCV: 89.3 fL (ref 78.0–100.0)
PLATELETS: 174 10*3/uL (ref 150–400)
RBC: 3.46 MIL/uL — ABNORMAL LOW (ref 4.22–5.81)
RDW: 15.9 % — AB (ref 11.5–15.5)
WBC: 8.2 10*3/uL (ref 4.0–10.5)

## 2015-11-02 SURGERY — CARDIOVERSION
Anesthesia: Monitor Anesthesia Care

## 2015-11-02 MED ORDER — INSULIN ASPART 100 UNIT/ML ~~LOC~~ SOLN
0.0000 [IU] | Freq: Three times a day (TID) | SUBCUTANEOUS | Status: DC
  Administered 2015-11-02 (×2): 2 [IU] via SUBCUTANEOUS
  Administered 2015-11-02: 3 [IU] via SUBCUTANEOUS

## 2015-11-02 MED ORDER — SODIUM CHLORIDE 0.9% FLUSH
10.0000 mL | INTRAVENOUS | Status: DC | PRN
Start: 2015-11-02 — End: 2015-11-05
  Administered 2015-11-04: 10 mL
  Filled 2015-11-02: qty 40

## 2015-11-02 MED ORDER — SODIUM CHLORIDE 0.9% FLUSH
10.0000 mL | Freq: Two times a day (BID) | INTRAVENOUS | Status: DC
  Administered 2015-11-02: 10 mL

## 2015-11-02 MED ORDER — FUROSEMIDE 40 MG PO TABS: 40.0000 mg | ORAL_TABLET | Freq: Every day | ORAL | Status: DC

## 2015-11-02 MED ORDER — INSULIN ASPART 100 UNIT/ML ~~LOC~~ SOLN: 0.0000 [IU] | Freq: Three times a day (TID) | SUBCUTANEOUS | Status: DC

## 2015-11-02 MED ORDER — TORSEMIDE 20 MG PO TABS
40.0000 mg | ORAL_TABLET | Freq: Every day | ORAL | Status: DC
  Administered 2015-11-02: 40 mg via ORAL
  Filled 2015-11-02: qty 2

## 2015-11-02 NOTE — Progress Notes (Signed)
Called by [facility] RN and notified CVC dressing had almost been removed. This RN to bedside to assess. Dressing no longer covering insertion site. Pt instructed to not reach hand up towards catheter. This RN and Laurey Arrow changed dressing immediately. Upon assessing insertion site, redness and swelling was observed. Darrick Grinder NP paged to notify.  Verbal orders given to D/C central line once PIV established.

## 2015-11-02 NOTE — Progress Notes (Signed)
Physical Therapy Treatment Patient Details Name: [name] [name] MRN: [id number] DOB: [birth year] Today's Date: 11/02/2015    History of Present Illness 70 yo M with plethora of health issues, EF 15%, IACD, CVA rt side weakness 2015, IDDM who presents to [facility] ED with 24 hours of increased confusion, rt arm tremor, agitation. Found to have NSTEMI. He vomited and most likely aspirated 7/6 with patchy pna in the right upper lobe and right lower lobe. Intubated from 7/6 to 7/8    PT Comments    [name] [name] made good progress today; however he continues to have poor awareness of safety and his deficits.  He requires mod assist due to staggering Lt/Rt while ambulating.  Continue to recommend SNF at d/c due to current functional status and assist required.     Follow Up Recommendations  SNF;Supervision/Assistance - 24 hour     Equipment Recommendations  None recommended by PT    Recommendations for Other Services       Precautions / Restrictions Precautions Precautions: Fall Restrictions Weight Bearing Restrictions: No    Mobility  Bed Mobility Overal bed mobility: Needs Assistance Bed Mobility: Sit to Supine       Sit to supine: Supervision   General bed mobility comments: Pt sitting in recliner chair upon PT arrival  Transfers Overall transfer level: Needs assistance Equipment used: 1 person hand held assist Transfers: Sit to/from Stand Sit to Stand: Mod assist         General transfer comment: Assist to boost to standing with pt initiating. 1 person HHA once standing for stability.  Ambulation/Gait Ambulation/Gait assistance: Mod assist;+2 safety/equipment Ambulation Distance (Feet): 200 Feet Assistive device: 1 person hand held assist Gait Pattern/deviations: Decreased stride length;Staggering left;Staggering right;Narrow base of support Gait velocity: decreased   General Gait Details: Pt unsteady, requiring mod assist (1 HHA and use of gait belt) due to  staggering Lt/Rt. VSS.   Stairs            Wheelchair Mobility    Modified Rankin (Stroke Patients Only) Modified Rankin (Stroke Patients Only) Pre-Morbid Rankin Score: No symptoms Modified Rankin: Moderately severe disability     Balance Overall balance assessment: Needs assistance Sitting-balance support: No upper extremity supported;Feet supported Sitting balance-Leahy Scale: Good     Standing balance support: During functional activity;Single extremity supported Standing balance-Leahy Scale: Poor Standing balance comment: Relies on UE support                    Cognition Arousal/Alertness: Awake/alert Behavior During Therapy: Flat affect;Impulsive Overall Cognitive Status: Impaired/Different from baseline Area of Impairment: Safety/judgement;Awareness;Problem solving Orientation Level: Disoriented to;Situation Current Attention Level: Selective Memory: Decreased short-term memory   Safety/Judgement: Decreased awareness of safety;Decreased awareness of deficits Awareness: Emergent Problem Solving: Slow processing;Requires verbal cues General Comments: Pt oriented x4 but presents with flat affect and demonstrates poor safety awareness.    Exercises General Exercises - Lower Extremity Long Arc Quad: Both;10 reps;Seated    General Comments General comments (skin integrity, edema, etc.): VSS      Pertinent Vitals/Pain Pain Assessment: No/denies pain    Home Living                      Prior Function            PT Goals (current goals can now be found in the care plan section) Acute Rehab PT Goals Patient Stated Goal: to go home PT Goal Formulation: With patient Time For Goal  Achievement: 11/14/15 Potential to Achieve Goals: Good Progress towards PT goals: Progressing toward goals    Frequency  Min 3X/week    PT Plan Current plan remains appropriate    Co-evaluation             End of Session Equipment Utilized During  Treatment: Gait belt Activity Tolerance: Patient tolerated treatment well Patient left: with call bell/phone within reach;in chair;with chair alarm set;with family/visitor present;with nursing/sitter in room     Time: [id number] PT Time Calculation (min) (ACUTE ONLY): 20 min  Charges:  $Gait Training: 8-22 mins                    G Codes:      Collie Siad PT, DPT  Pager: [phone] Phone: [phone] 11/02/2015, 3:10 PM

## 2015-11-02 NOTE — Progress Notes (Signed)
Occupational Therapy Treatment Patient Details Name: [name] [name] MRN: [id number] DOB: 07-08-1945 Today's Date: 11/02/2015    History of present illness 70 yo M with plethora of health issues, EF 15%, IACD, CVA rt side weakness 2015, IDDM who presents to [facility] ED with 24 hours of increased confusion, rt arm tremor, agitation. Found to have NSTEMI. He vomited and most likely aspirated 7/6 with patchy pna in the right upper lobe and right lower lobe. Intubated from 7/6 to 7/8   OT comments  Pt making steady progress towards OT goals. Pt performed grooming tasks at sink and LB ADLs with min guard assist and required min assist for basic transfers due to balance deficits. Pt continues to demonstrate cognitive deficits including poor safety and deficit awareness putting him at an increased fall risk. Continue to recommend SNF at this time due to fall risk and cognitive deficits, but feel pt will progress well and may be appropriate for d/c home with [facility]. Will continue to follow acutely.   Follow Up Recommendations  SNF;Supervision/Assistance - 24 hour (likely will progress to [facility])    Equipment Recommendations  3 in 1 bedside comode    Recommendations for Other Services      Precautions / Restrictions Precautions Precautions: Fall Restrictions Weight Bearing Restrictions: No       Mobility Bed Mobility Overal bed mobility: Needs Assistance Bed Mobility: Sit to Supine       Sit to supine: Supervision   General bed mobility comments: Supervision for safety and line management. HOB flat, no use of bedrails.  Transfers Overall transfer level: Needs assistance Equipment used: None Transfers: Sit to/from Stand Sit to Stand: Min assist         General transfer comment: Min assist for stability and safety. Pt with several minor LOB and staggering steps that required min assist to regain balance. Pt also attempting to reach for furniture for support while ambulating to/from sink.  VCs for safe hand placement.    Balance Overall balance assessment: Needs assistance Sitting-balance support: No upper extremity supported;Feet supported Sitting balance-Leahy Scale: Good     Standing balance support: No upper extremity supported;During functional activity Standing balance-Leahy Scale: Poor Standing balance comment: reliant on UE support or leaning trunk against countertop to maintain balance                   ADL Overall ADL's : Needs assistance/impaired     Grooming: Wash/dry hands;Wash/dry face;Oral care;Min guard;Standing           Upper Body Dressing : Min guard;Sitting   Lower Body Dressing: Min guard;Sit to/from stand   Toilet Transfer: Minimal assistance;Cueing for safety;Ambulation;Regular Toilet   Toileting- Clothing Manipulation and Hygiene: Minimal assistance;Sit to/from stand       Functional mobility during ADLs: Minimal assistance General ADL Comments: Pt with improved cognition, but still requires cues for safety.      Vision                     Perception     Praxis      Cognition   Behavior During Therapy: [facility] for tasks assessed/performed Overall Cognitive Status: Impaired/Different from baseline Area of Impairment: Orientation;Memory;Attention;Safety/judgement;Awareness Orientation Level: Disoriented to;Situation Current Attention Level: Selective Memory: Decreased short-term memory    Safety/Judgement: Decreased awareness of safety;Decreased awareness of deficits Awareness: Emergent Problem Solving: Slow processing;Requires verbal cues General Comments: Pt oriented x3 this session, but still demonstrating slow processing and poor safety awareness during activity.  Extremity/Trunk Assessment               Exercises     Shoulder Instructions       General Comments      Pertinent Vitals/ Pain       Pain Assessment: No/denies pain  Home Living                                           Prior Functioning/Environment              Frequency Min 2X/week     Progress Toward Goals  OT Goals(current goals can now be found in the care plan section)  Progress towards OT goals: Progressing toward goals  Acute Rehab OT Goals Patient Stated Goal: to go home  Time For Goal Achievement: 11/14/15 Potential to Achieve Goals: Good ADL Goals Pt Will Perform Grooming: with min guard assist;standing Pt Will Perform Upper Body Bathing: standing;sitting;with min guard assist Pt Will Perform Lower Body Bathing: with min guard assist;sit to/from stand Pt Will Perform Upper Body Dressing: with set-up;with supervision;sitting Pt Will Perform Lower Body Dressing: with min guard assist;sit to/from stand Pt Will Transfer to Toilet: with min guard assist;ambulating;bedside commode Pt Will Perform Toileting - Clothing Manipulation and hygiene: with min guard assist;sit to/from stand Pt/caregiver will Perform Home Exercise Program: Increased ROM;Increased strength;Right Upper extremity;With written HEP provided  Plan Discharge plan needs to be updated    Co-evaluation                 End of Session Equipment Utilized During Treatment: Gait belt   Activity Tolerance Patient tolerated treatment well   Patient Left in bed;with call bell/phone within reach;with nursing/sitter in room;with SCD's reapplied   Nurse Communication Mobility status        Time: 1410-1433 OT Time Calculation (min): 23 min  Charges: OT General Charges $OT Visit: 1 Procedure OT Treatments $Self Care/Home Management : 23-37 mins  Redmond Baseman 11/02/2015, 2:46 PM

## 2015-11-02 NOTE — Progress Notes (Signed)
Nutrition Follow-up  DOCUMENTATION CODES:   Severe malnutrition in context of chronic illness  INTERVENTION:   Magic cup TID with meals, each supplement provides 290 kcal and 9 grams of protein  NUTRITION DIAGNOSIS:   Malnutrition related to chronic illness as evidenced by severe depletion of muscle mass, 8 percent weight loss in 3 months. Ongoing.   GOAL:   Patient will meet greater than or equal to 90% of their needs Progressing.   MONITOR:   Vent status, Labs, Weight trends, TF tolerance, I & O's  ASSESSMENT:   70 yo M with plethora of health issues, EF 15%, IACD, CVA rt side weakness 2015, IDDM who presents to [facility] ED with 24 hours of increased confusion, rt arm tremor, agitation. He vomited and most likely aspirated 7/6 and required intubation for airway protection.  7/8 extubated 7/11 heart cath with stent placement due to Large NSTEMI Weight elevated 20 lb, pt is 5.3 L positive since admission  Spoke with pt and wife. Appetite pretty good this am. Remeron had helped his appetite at home. They were trying to help him gain weight after his weight loss.  Willing to try magic cup, has not liked ensure in the past. Gave suggestions for consumption of ensure if needed.   Medications reviewed and include: lantus, remeron  Labs reviewed: hgb A1C: 12 CBG's: 96-253 Meal Completion: 50-80%   Diet Order:  DIET DYS 3 Room service appropriate?: Yes; Fluid consistency:: Thin  Skin:  Wound (see comment) (stage II sacrum, abrasions, skin tears)  Last BM:  unknown  Height:   Ht Readings from Last 1 Encounters:  10/27/15 6\' 2"  (1.88 m)    Weight:   Wt Readings from Last 1 Encounters:  11/02/15 168 lb 4.8 oz (76.34 kg)    Ideal Body Weight:  86.4 kg  BMI:  Body mass index is 21.6 kg/(m^2).  Estimated Nutritional Needs:   Kcal:  1900-2100  Protein:  110-120 gm  Fluid:  2 L  EDUCATION NEEDS:   No education needs identified at this time  [place],  [place], [place] Pager [phone] After Hours Pager

## 2015-11-02 NOTE — Progress Notes (Signed)
Patient ID: [name] [name], male   DOB: 10/18/1945, 70 y.o.   MRN: [id number]     Advanced Heart Failure Rounding Note  Primary Physician: [name], MD Primary Cardiologist: [place] Electrophysiologist: Caryl Comes   Reason for Consultation: ICD shocks  Subjective:    Admitted to ICU 10/27/15 after being found down at home with ? CVA findings. Intubated for airway protection with vomiting.  Had VT PTA with ICD therapy. Extubated 7/8.   EEG- no seizure activity. Started on keppra by Neurology.  ECHO- 10/29/15 EF 15-20%. Diffuse HK. Akinesis apical myocardium. Grade II DD.  LHC 7/11 1. Prox LAD to Mid LAD lesion, 90% stenosed. 2. Lat 1st Diag lesion, 90% stenosed. 3. Ost 3rd Mrg to 3rd Mrg lesion, 95% stenosed. 4. Ost 2nd Mrg to 2nd Mrg lesion, 60% stenosed. 5. Ost Ramus to Ramus lesion, 90% stenosed. 6. Ost 4th Mrg to 4th Mrg lesion, 70% stenosed. 7. Mid RCA lesion, 100% stenosed. 8. Dist Cx lesion, 90% stenosed. PCI Post intervention, there is a 0% residual stenosis.--     Denies SOB/CP.       Objective:   Weight Range: 168 lb 4.8 oz (76.34 kg) Body mass index is 21.6 kg/(m^2).   Vital Signs:   Temp:  [98.1 F (36.7 C)-98.6 F (37 C)] 98.3 F (36.8 C) (07/12 0318) Pulse Rate:  [54-119] 81 (07/12 0700) Resp:  [13-36] 23 (07/12 0700) BP: (91-149)/(50-107) 100/65 mmHg (07/12 0700) SpO2:  [90 %-100 %] 96 % (07/12 0700) Weight:  [168 lb 4.8 oz (76.34 kg)] 168 lb 4.8 oz (76.34 kg) (07/12 0318) Last BM Date:  (unknown PTA?)  Weight change: Filed Weights   10/31/15 0718 11/01/15 0400 11/02/15 0318  Weight: 164 lb 6.4 oz (74.571 kg) 165 lb (74.844 kg) 168 lb 4.8 oz (76.34 kg)    Intake/Output:   Intake/Output Summary (Last 24 hours) at 11/02/15 0727 Last data filed at 11/02/15 0400  Gross per 24 hour  Intake 606.05 ml  Output   1400 ml  Net -793.95 ml     Physical Exam: General: NAD. In bed.  HEENT: Normal  Neck: supple. JVP ~10 . Carotids 2+ bilat; no bruits.  No thyromegaly or nodule noted. Cor: PMI nondisplaced. Regular rate & rhythm. Regular rate and rhythm, 2/6 systolic murmur LLSB Lungs: Mechanical breath sounds.  Abdomen: soft, NT, ND, no HSM. No bruits or masses. +BS  Extremities: no cyanosis, clubbing, rash, edema. RLE a dn LLE SCDs.   No DP pulse appreciated either side.  Neuro: alert & oriented x person, cranial nerves grossly intact. moves all 4 extremities w/o difficulty. Affect pleasant  Telemetry: Sinus Rhythm 70s.    Labs: CBC  Recent Labs  11/01/15 0410 11/02/15 0345  WBC 10.0 8.2  HGB 10.1* 9.6*  HCT 31.7* 30.9*  MCV 87.8 89.3  PLT 176 [id number]   Basic Metabolic Panel  Recent Labs  11/01/15 0410 11/02/15 0345  NA 141 141  K 3.9 4.0  CL 114* 114*  CO2 22 22  GLUCOSE 150* 172*  BUN 19 16  CREATININE 1.46* 1.55*  CALCIUM 8.0* 7.9*  MG 1.8  --    Liver Function Tests  Recent Labs  11/01/15 0410  AST 21  ALT 15*  ALKPHOS 140*  BILITOT 1.0  PROT 5.4*  ALBUMIN 2.1*   No results for input(s): LIPASE, AMYLASE in the last 72 hours. Cardiac Enzymes No results for input(s): CKTOTAL, CKMB, CKMBINDEX, TROPONINI in the last 72 hours.  BNP: BNP (last 3  results)  Recent Labs  04/11/15 0400 07/26/15 1707 10/27/15 1124  BNP 2568.2* 2061.3* 1318.0*    ProBNP (last 3 results) No results for input(s): PROBNP in the last 8760 hours.   D-Dimer No results for input(s): DDIMER in the last 72 hours. Hemoglobin A1C No results for input(s): HGBA1C in the last 72 hours. Fasting Lipid Panel No results for input(s): CHOL, HDL, LDLCALC, TRIG, CHOLHDL, LDLDIRECT in the last 72 hours. Thyroid Function Tests No results for input(s): TSH, T4TOTAL, T3FREE, THYROIDAB in the last 72 hours.  Invalid input(s): FREET3  Other results:     Imaging/Studies:  No results found.  Latest Echo  Latest Cath   Medications:     Scheduled Medications: . amiodarone  400 mg Oral BID  . antiseptic oral rinse  7 mL  Mouth Rinse QID  . aspirin  81 mg Oral Daily  . carvedilol  3.125 mg Oral BID WC  . clopidogrel  75 mg Oral Daily  . digoxin  0.125 mg Oral Daily  . enoxaparin (LOVENOX) injection  40 mg Subcutaneous Daily  . insulin aspart  0-5 Units Subcutaneous QHS  . insulin aspart  0-9 Units Subcutaneous TID WC  . insulin glargine  15 Units Subcutaneous Daily  . mirtazapine  15 mg Oral QHS  . rosuvastatin  40 mg Per Tube q1800  . sodium chloride  1,000 mL Intravenous Once  . sodium chloride flush  3 mL Intravenous Q12H  . spironolactone  12.5 mg Oral Daily  . tamsulosin  0.4 mg Oral Daily        Infusions: . sodium chloride 10 mL/hr at 11/01/15 0844    PRN Medications: sodium chloride, acetaminophen, ondansetron (ZOFRAN) IV, RESOURCE THICKENUP CLEAR, sodium chloride flush   Assessment   1. VT 2. NSTEMI 3. Chronic systolic HF: [employer] Jude ICD.  4. CAD 5. ?CVA  6. Sepsis 7. Acute respiratory failure requiring intubation: Suspect aspiration pneumonitis/PNA.  8. UTI 9. Seizures   Plan   He has had a large NSTEMI with VT.  Post LHC showed progressive lesion in distal LCx (dominant vessel).  Diffuse LAD disease and subtotal nondominant RCA occlusion similar to prior.  LAD territory nonviable on prior cMRI.  Had PCI  to distal LCx.    - Will continue Plavix/ASA/statin.   No further VT, now on po amiodarone.  Echo completed 7/8 with severely reduced EF 15-20%, grade II DD. Diffuse HK. This is similar to the past echo. CO-OX 97%. Repeat now.   - Continue current Coreg, spironolactone, and digoxin.  Digoxin level ok.  - Creatinine 1.4>1.5. Hold off on ACEI. Can add tomorrow.  - Add 40 mg po Lasix daily.    ? Probable seizures- Neurology following. EEG no seizure activity. On keppra 500 mg twice a day.   Transfer to stepdown. Consult cardiac rehab.   Length of Stay: [place] NP-C  11/02/2015, 7:27 AM  Advanced Heart Failure Team Pager [phone] (M-F; 7a - 4p)  Please contact  [facility] for night-coverage after hours (4p -7a ) and weekends on [email or web address]  Patient seen with NP, agree with the above note.  He is drowsy but seems more oriented than yesterday, out of restraints.  No dyspnea.  Mild elevation in JVP.  - Can restart home torsemide 40 mg daily.  - Continue digoxin, Coreg, spironolactone at current doses.  - If creatinine stays stable, could use low dose ACEI tomorrow.  - Mobilized, out of bed. - Continue ASA/Plavix/statin.  Transfer to step-down.   Loralie Champagne 11/02/2015 7:55 AM

## 2015-11-02 NOTE — Progress Notes (Signed)
[place] TEAM 1 - Stepdown/ICU TEAM  [name] [name]  [id number] DOB: Feb 17, 1946 DOA: 10/27/2015 PCP: [name], MD    Brief Narrative:  70 yo M with a complex hx to include EF 15%, IACD, CVA rt side weakness 2015, and DM who presented to [facility] ED with 24 hours of increased confusion, rt arm tremor, and agitation. He vomited and most likely aspirated and required intubation for airway protection. He had VT in ER and his  IACD fired.   Significant Events: 7/6 - admitted - V tach in the ED, AICD fired - intubated 7/8 - extubated 7/10 - [facility] assumed care  Subjective: The pt is resting comfortably at the time of my visit.    Assessment & Plan:  Acute Hypoxic Respiratory Failure Secondary to aspiration pneumonia S/P extubation 7/8 - sats in the mid 90s on room air  Large NSTEMI Peak Troponin > 65 - care per Cardiology - cath revealed distal dominant LCx stenosis as likely source   H/O Ischemic Cardiomyopathy w/ Systolic CHF last TTE w/ EF 15%, diffuse septal hypokinesis and apical akinesis along w/ cavity size severely dilated - f/u TTE this admit notes EF still 15-20% - care per Cardiology/CHF Team   Ventricular Tachycardia - IACD Fired in ED 7/6 Care per Cards - on amio oral   Hyperlipidemia  Probable Partial Seizure Care per Neuro - holding on med tx for now   Acute Encephalopathy Multifactorial - will require SNF for 24hr supervision initially   Possible New Acute CVA - H/O CVA 2015 - Mild right-sided weakness & slurred speech Symptom onset 7/5- Neurology consulted & suggested continuing ASA and statin   Acute on CKD III Resolved - baseline Cr 1.7-2.2  Recent Labs Lab 10/29/15 0459 10/30/15 0500 10/31/15 0527 11/01/15 0410 11/02/15 0345  CREATININE 1.21 1.20 1.44* 1.46* 1.55*    Nutrition - Probable Aspiration- Severe malnutrition in context of chronic illness Speech Eval has cleared for D3 with thin liquid diet - Nutrition following  Anemia Mild - no  signs of active bleeding  Klebsiella UTI abx tx has been completed - follow   DM A1c 12.0 - CBGs currently reasonably controlled  DVT prophylaxis: lovenox  Code Status: FULL CODE Family Communication: No family present at time of visit today Disposition Plan: SDU  Consultants:  [facility]  PCCM Neurology   Antimicrobials:  Unasyn 7/6 > 7/11  Objective: Blood pressure 102/66, pulse 69, temperature 98 F (36.7 C), temperature source Oral, resp. rate 27, height 6\' 2"  (1.88 m), weight 76.34 kg (168 lb 4.8 oz), SpO2 97 %.  Intake/Output Summary (Last 24 hours) at 11/02/15 1510 Last data filed at 11/02/15 1330  Gross per 24 hour  Intake    830 ml  Output   1150 ml  Net   -320 ml   Filed Weights   10/31/15 0718 11/01/15 0400 11/02/15 0318  Weight: 74.571 kg (164 lb 6.4 oz) 74.844 kg (165 lb) 76.34 kg (168 lb 4.8 oz)    Examination: General: No acute respiratory distress Lungs: Clear to auscultation bilaterally  Cardiovascular: Regular rate and rhythm  Abdomen: Nondistended, soft, bowel sounds positive Extremities: No significant cyanosis, or clubbing - trace edema bilateral lower extremities  CBC:  Recent Labs Lab 10/27/15 0757  10/29/15 0459 10/30/15 0500 10/31/15 0527 11/01/15 0410 11/02/15 0345  WBC 18.3*  < > 12.0* 14.6* 11.9* 10.0 8.2  NEUTROABS 16.6*  --   --   --   --   --   --  HGB 12.0*  < > 10.9* 11.5* 11.5* 10.1* 9.6*  HCT 38.0*  < > 35.3* 36.4* 35.2* 31.7* 30.9*  MCV 88.2  < > 91.2 91.0 87.6 87.8 89.3  PLT 224  < > 168 179 205 176 174  < > = values in this interval not displayed. Basic Metabolic Panel:  Recent Labs Lab 10/27/15 1124  10/28/15 1250 10/28/15 1736 10/29/15 0459 10/29/15 1601 10/30/15 0500 10/31/15 0527 11/01/15 0410 11/02/15 0345  NA 139  < >  --   --  141  --  144 143 141 141  K 5.6*  < >  --   --  4.0  --  3.8 3.6 3.9 4.0  CL 106  < >  --   --  114*  --  116* 116* 114* 114*  CO2 24  < >  --   --  22  --  21* 20*  22 22  GLUCOSE 457*  < >  --   --  301*  --  122* 146* 150* 172*  BUN 25*  < >  --   --  19  --  16 18 19 16   CREATININE 1.61*  < >  --   --  1.21  --  1.20 1.44* 1.46* 1.55*  CALCIUM 8.2*  < >  --   --  7.8*  --  8.1* 8.3* 8.0* 7.9*  MG 1.8  --  1.8 2.4 2.2 2.1  --   --  1.8  --   PHOS 3.1  --  2.9 2.9 2.4* 2.1*  --   --   --   --   < > = values in this interval not displayed. GFR: Estimated Creatinine Clearance: 48.5 mL/min (by C-G formula based on Cr of 1.55).  Liver Function Tests:  Recent Labs Lab 10/27/15 0757 10/27/15 1124 11/01/15 0410  AST 35 60* 21  ALT 27 27 15*  ALKPHOS 142* 125 140*  BILITOT 0.8 1.3* 1.0  PROT 7.2 6.3* 5.4*  ALBUMIN 3.2* 2.8* 2.1*    Coagulation Profile:  Recent Labs Lab 10/27/15 0757 10/27/15 1124  INR 1.10 1.20    Cardiac Enzymes:  Recent Labs Lab 10/27/15 0757 10/27/15 1124 10/27/15 1530 10/27/15 2047  TROPONINI 1.36* 20.31* >65.00* >65.00*    HbA1C: HGB A1C MFR BLD  Date/Time Value Ref Range Status  10/27/2015 11:24 AM 12.0* 4.8 - 5.6 % Final    Comment:    (NOTE)         Pre-diabetes: 5.7 - 6.4         Diabetes: >6.4         Glycemic control for adults with diabetes: <7.0   07/26/2015 05:07 PM 9.1* 4.8 - 5.6 % Final    Comment:    (NOTE)         Pre-diabetes: 5.7 - 6.4         Diabetes: >6.4         Glycemic control for adults with diabetes: <7.0     CBG:  Recent Labs Lab 11/01/15 1541 11/01/15 2137 11/02/15 0753 11/02/15 0926 11/02/15 1127  GLUCAP 253* 187* 96 194* 184*    Recent Results (from the past 240 hour(s))  Blood culture (routine x 2)     Status: None   Collection Time: 10/27/15  8:57 AM  Result Value Ref Range Status   Specimen Description BLOOD LEFT FOREARM  Final   Special Requests BOTTLES DRAWN AEROBIC ONLY  [place]  Final  Culture NO GROWTH 5 DAYS  Final   Report Status 11/01/2015 FINAL  Final  Blood culture (routine x 2)     Status: None   Collection Time: 10/27/15  8:59 AM  Result  Value Ref Range Status   Specimen Description BLOOD RIGHT ANTECUBITAL  Final   Special Requests BOTTLES DRAWN AEROBIC AND ANAEROBIC  5CC  Final   Culture NO GROWTH 5 DAYS  Final   Report Status 11/01/2015 FINAL  Final  Urine culture     Status: Abnormal   Collection Time: 10/27/15  9:00 AM  Result Value Ref Range Status   Specimen Description URINE, CATHETERIZED  Final   Special Requests Normal  Final   Culture >=100,000 COLONIES/mL KLEBSIELLA OXYTOCA (A)  Final   Report Status 10/29/2015 FINAL  Final   Organism ID, Bacteria KLEBSIELLA OXYTOCA (A)  Final      Susceptibility   Klebsiella oxytoca - MIC*    AMPICILLIN 16 RESISTANT Resistant     CEFAZOLIN <=4 SENSITIVE Sensitive     CEFTRIAXONE <=1 SENSITIVE Sensitive     CIPROFLOXACIN <=0.25 SENSITIVE Sensitive     GENTAMICIN <=1 SENSITIVE Sensitive     IMIPENEM <=0.25 SENSITIVE Sensitive     NITROFURANTOIN <=16 SENSITIVE Sensitive     TRIMETH/SULFA <=20 SENSITIVE Sensitive     AMPICILLIN/SULBACTAM 4 SENSITIVE Sensitive     PIP/TAZO <=4 SENSITIVE Sensitive     * >=100,000 COLONIES/mL KLEBSIELLA OXYTOCA  MRSA PCR Screening     Status: None   Collection Time: 10/27/15 11:28 AM  Result Value Ref Range Status   MRSA by PCR NEGATIVE NEGATIVE Final    Comment:        The GeneXpert MRSA Assay (FDA approved for NASAL specimens only), is one component of a comprehensive MRSA colonization surveillance program. It is not intended to diagnose MRSA infection nor to guide or monitor treatment for MRSA infections.   Culture, blood (routine x 2)     Status: None   Collection Time: 10/27/15 11:39 AM  Result Value Ref Range Status   Specimen Description BLOOD LEFT WRIST  Final   Special Requests BOTTLES DRAWN AEROBIC ONLY 5CC  Final   Culture NO GROWTH 5 DAYS  Final   Report Status 11/01/2015 FINAL  Final  Culture, blood (routine x 2)     Status: None   Collection Time: 10/27/15 11:44 AM  Result Value Ref Range Status   Specimen  Description BLOOD LEFT HAND  Final   Special Requests IN PEDIATRIC BOTTLE 1CC  Final   Culture NO GROWTH 5 DAYS  Final   Report Status 11/01/2015 FINAL  Final  Culture, respiratory (NON-Expectorated)     Status: None   Collection Time: 10/28/15  1:05 PM  Result Value Ref Range Status   Specimen Description TRACHEAL ASPIRATE  Final   Special Requests Normal  Final   Gram Stain   Final    ABUNDANT WBC PRESENT, PREDOMINANTLY PMN NO SQUAMOUS EPITHELIAL CELLS SEEN NO ORGANISMS SEEN    Culture RARE CANDIDA ALBICANS  Final   Report Status 10/31/2015 FINAL  Final     Scheduled Meds: . amiodarone  400 mg Oral BID  . antiseptic oral rinse  7 mL Mouth Rinse QID  . aspirin  81 mg Oral Daily  . carvedilol  3.125 mg Oral BID WC  . clopidogrel  75 mg Oral Daily  . digoxin  0.125 mg Oral Daily  . enoxaparin (LOVENOX) injection  40 mg Subcutaneous Daily  . insulin  aspart  0-5 Units Subcutaneous QHS  . insulin aspart  0-9 Units Subcutaneous TID WC  . insulin glargine  15 Units Subcutaneous Daily  . mirtazapine  15 mg Oral QHS  . rosuvastatin  40 mg Per Tube q1800  . sodium chloride  1,000 mL Intravenous Once  . sodium chloride flush  10-40 mL Intracatheter Q12H  . sodium chloride flush  3 mL Intravenous Q12H  . spironolactone  12.5 mg Oral Daily  . tamsulosin  0.4 mg Oral Daily  . torsemide  40 mg Oral Daily     LOS: 6 days    Cherene Altes, MD [facility] Office  [phone] Pager - Text Page per Amion as per below:  On-Call/Text Page:      [email or web address]      password [email or web address]  If 7PM-7AM, please contact night-coverage [email or web address] Password [email or web address] 11/02/2015, 3:10 PM

## 2015-11-03 DIAGNOSIS — B961 Klebsiella pneumoniae [K. pneumoniae] as the cause of diseases classified elsewhere: Secondary | ICD-10-CM

## 2015-11-03 DIAGNOSIS — A498 Other bacterial infections of unspecified site: Secondary | ICD-10-CM | POA: Diagnosis present

## 2015-11-03 DIAGNOSIS — I5043 Acute on chronic combined systolic (congestive) and diastolic (congestive) heart failure: Secondary | ICD-10-CM | POA: Diagnosis present

## 2015-11-03 LAB — CBC
HCT: 33.3 % — ABNORMAL LOW (ref 39.0–52.0)
HEMOGLOBIN: 10.5 g/dL — AB (ref 13.0–17.0)
MCH: 28.1 pg (ref 26.0–34.0)
MCHC: 31.5 g/dL (ref 30.0–36.0)
MCV: 89 fL (ref 78.0–100.0)
PLATELETS: 242 10*3/uL (ref 150–400)
RBC: 3.74 MIL/uL — AB (ref 4.22–5.81)
RDW: 15.5 % (ref 11.5–15.5)
WBC: 11.3 10*3/uL — AB (ref 4.0–10.5)

## 2015-11-03 LAB — BASIC METABOLIC PANEL
ANION GAP: 8 (ref 5–15)
BUN: 18 mg/dL (ref 6–20)
CHLORIDE: 109 mmol/L (ref 101–111)
CO2: 23 mmol/L (ref 22–32)
Calcium: 7.9 mg/dL — ABNORMAL LOW (ref 8.9–10.3)
Creatinine, Ser: 1.71 mg/dL — ABNORMAL HIGH (ref 0.61–1.24)
GFR, EST AFRICAN AMERICAN: 45 mL/min — AB (ref >60)
GFR, EST NON AFRICAN AMERICAN: 39 mL/min — AB (ref >60)
Glucose, Bld: 227 mg/dL — ABNORMAL HIGH (ref 65–99)
POTASSIUM: 3.9 mmol/L (ref 3.5–5.1)
SODIUM: 140 mmol/L (ref 135–145)

## 2015-11-03 LAB — GLUCOSE, CAPILLARY
GLUCOSE-CAPILLARY: 175 mg/dL — AB (ref 65–99)
GLUCOSE-CAPILLARY: 181 mg/dL — AB (ref 65–99)
Glucose-Capillary: 138 mg/dL — ABNORMAL HIGH (ref 65–99)
Glucose-Capillary: 215 mg/dL — ABNORMAL HIGH (ref 65–99)

## 2015-11-03 MED ORDER — TORSEMIDE 20 MG PO TABS
20.0000 mg | ORAL_TABLET | Freq: Every day | ORAL | Status: DC
  Administered 2015-11-03 – 2015-11-06 (×4): 20 mg via ORAL
  Filled 2015-11-03 (×4): qty 1

## 2015-11-03 MED ORDER — INSULIN ASPART 100 UNIT/ML ~~LOC~~ SOLN
0.0000 [IU] | SUBCUTANEOUS | Status: DC
  Administered 2015-11-03: 2 [IU] via SUBCUTANEOUS
  Administered 2015-11-03: 3 [IU] via SUBCUTANEOUS
  Administered 2015-11-03: 5 [IU] via SUBCUTANEOUS
  Administered 2015-11-03: 3 [IU] via SUBCUTANEOUS
  Administered 2015-11-04 (×2): 5 [IU] via SUBCUTANEOUS
  Administered 2015-11-04: 8 [IU] via SUBCUTANEOUS
  Administered 2015-11-04: 5 [IU] via SUBCUTANEOUS
  Administered 2015-11-05: 2 [IU] via SUBCUTANEOUS
  Administered 2015-11-05 (×3): 3 [IU] via SUBCUTANEOUS
  Administered 2015-11-05 – 2015-11-06 (×2): 5 [IU] via SUBCUTANEOUS

## 2015-11-03 MED ORDER — INSULIN GLARGINE 100 UNIT/ML ~~LOC~~ SOLN
20.0000 [IU] | Freq: Every day | SUBCUTANEOUS | Status: DC
  Administered 2015-11-03 – 2015-11-06 (×4): 20 [IU] via SUBCUTANEOUS
  Filled 2015-11-03 (×4): qty 0.2

## 2015-11-03 NOTE — Consult Note (Signed)
   [facility] Inpatient Consult   11/03/2015  [name] [name] [birth year] [id number]  Met with the patient and his family.  Patient alert and oriented, HIPAA verified.. Patient verbalized ongoing follow up with [facility].  Patient was assessed for [place] [facility] for community services.  Active consent form on file.   Of note, [facility] Care Management services does not replace or interfere with any services that are arranged by inpatient case management or social work. For additional questions  please contact:  Natividad Brood, RN BSN [facility] Liaison  [phone] business mobile phone Toll free office [phone]

## 2015-11-03 NOTE — Progress Notes (Signed)
PROGRESS NOTE    [name] [name]  [id number] DOB: 11-Dec-1945 DOA: 10/27/2015 PCP: [name], MD   Brief Narrative:  70 yo WM PMHx CVA 2013, HTN,Chronic combined systolic and diastolic heart failure (EF 15%), Ischemic cardiomyopathy, CAD native artery S/P Automatic implantable cardioverter-defibrillator,NSVT, MI, OSA, DM Type 2  Who presents to [facility] ED with 24 hours of increased confusion, rt arm tremor, agitation. He vomited and most likely aspirated 7/6 and will require intubation for airway protection. Neuro and Cards will see patient, note he had VT in ER and IACD fired. We will admit to ICU, treat with abx,do nuero and cardiology work ups. He is a full code per wife and family   Assessment & Plan:   Active Problems:   Aspiration pneumonia (HCC)   Respiratory failure (HCC)   NSTEMI (non-ST elevated myocardial infarction) ([place])   Encounter for central line placement   Acute on chronic respiratory failure with hypoxia (HCC)   Acute on chronic diastolic congestive heart failure (HCC)   PAH (pulmonary artery hypertension) (HCC)   Aspiration pneumonia of right lower lobe due to vomit (HCC)   Systolic and diastolic CHF, chronic (HCC)   HLD (hyperlipidemia)   Acute encephalopathy   Acute renal failure superimposed on stage 3 chronic kidney disease ([place])   Uncontrolled type 2 diabetes mellitus with complication (HCC)   Acute Hypoxic Respiratory Failure Secondary to aspiration pneumonia -Resolved  Large NSTEMI -Peak Troponin > 65  -7/11 Left heart cath multivessel disease with stent placement see results below  Ischemic Cardiomyopathy w/ Systolic and Diastolic CHF -7/8 echocardiogram EF remains 0000000, and diastolic CHF see results below -Strict in and out since admission + 3.4 L -Daily weight Filed Weights   11/01/15 0400 11/02/15 0318 11/03/15 0340  Weight: 74.844 kg (165 lb) 76.34 kg (168 lb 4.8 oz) 74.934 kg (165 lb 3.2 oz)  -Stable for discharge per  cardiology  Ventricular Tachycardia - IACD Fired in ED 7/6 -Amiodarone 400 mg  BID -Coreg 3.125 mg BID -Digoxin 0.125 mg daily  -Spironolactone 12.5 mg daily  Hyperlipidemia -Crestor 40 mg daily   Probable Partial Seizure -Not on seizure medication    Acute Encephalopathy -Resolved:   Possible New Acute CVA - H/O CVA 2015 - Mild right-sided weakness & slurred speech -Resolved   Acute on CKD III( baseline Cr 1.7-2.2) Lab Results  Component Value Date   CREATININE 1.71* 11/03/2015   CREATININE 1.55* 11/02/2015   CREATININE 1.46* 11/01/2015  -Within baseline  Nutrition - Probable Aspiration -Speech Eval has cleared for D3 with thin liquid diet  Anemia Mild - no signs of active bleeding  Positive Klebsiella UTI -Complete 5 days of antibiotics    DM Type 2 uncontrolled with, complication 7/6 Hemoglobin A1c 12.0  -Increase Lantus 20 units daily -Increase to moderate SSI   DVT prophylaxis: Lovenox Code Status: Full Family Communication: None Disposition Plan: Cardiology has cleared awaiting SNF clearance   Consultants:  [place] [facility]   Procedures/Significant Events:  7/6 - admitted - V tach in the ED, AICD fired - intubated 7/8 - extubated 7/8 echocardiogram:- Left ventricle: severely dilated.-LVEF= 15% to 20%. Severe diffuse hypokinesis. Akinesis of the apical myocardium. - (grade 2 diastolic dysfunction). - Left atrium: moderately dilated.- Pulmonary arteries: PA peak pressure: 57 mm Hg (S). 7/10 - TRH assumed care 7/11 left heart cath:-LAD: 1. Prox LAD to Mid LAD lesion, 90% stenosed. 2. Lat 1st Diag lesion, 90% stenosed. 3. Ost 3rd Mrg to 3rd Mrg lesion, 95%  stenosed. 4. Ost 2nd Mrg to 2nd Mrg lesion, 60% stenosed. 5. Ost Ramus to Ramus lesion, 90% stenosed. 6. Ost 4th Mrg to 4th Mrg lesion, 70% stenosed. 7. Mid RCA lesion, 100% stenosed. 8. Dist Cx lesion, 90% stenosed. S/P placement Stent Promus Prem Mr [id number] [phone] there is a  0% residual stenosis   Cultures 7/6 urine positive Klebsiella OXYTOCA 7/6 blood left wrist/hand negative 7/7 positive rare Candida albicans   Antimicrobials: Unasyn 7/6>> 7/11   Devices NA   LINES / TUBES:  Left IJ CVL    Continuous Infusions:     Subjective: 7/13 A/O 4, NAD.     Objective: Filed Vitals:   11/02/15 2327 11/03/15 0000 11/03/15 0340 11/03/15 0400  BP: 116/77 126/80 119/76 113/74  Pulse: 77  79   Temp: 98.9 F (37.2 C)  98.5 F (36.9 C)   TempSrc: Oral  Oral   Resp: 23 26 22 20   Height:      Weight:   74.934 kg (165 lb 3.2 oz)   SpO2: 96% 97% 96% 97%    Intake/Output Summary (Last 24 hours) at 11/03/15 [id number] Last data filed at 11/03/15 0700  Gross per 24 hour  Intake    490 ml  Output   2275 ml  Net  -1785 ml   Filed Weights   11/01/15 0400 11/02/15 0318 11/03/15 0340  Weight: 74.844 kg (165 lb) 76.34 kg (168 lb 4.8 oz) 74.934 kg (165 lb 3.2 oz)    Examination:  General:A/O 4, NAD., No acute respiratory distress Eyes: negative scleral hemorrhage, negative anisocoria, negative icterus ENT: Negative Runny nose, negative gingival bleeding, Neck:  Negative scars, masses, torticollis, lymphadenopathy, JVD, left IJ in place Lungs: Clear to auscultation bilaterally without wheezes or crackles Cardiovascular: Regular rate and rhythm positive systolic murmur , negative gallop or rub normal S1 and S2 Abdomen: negative abdominal pain, nondistended, positive soft, bowel sounds, no rebound, no ascites, no appreciable mass Extremities: No significant cyanosis, clubbing, or edema bilateral lower extremities Skin: Negative rashes, lesions, ulcers Psychiatric:  Negative depression, negative anxiety, negative fatigue, negative mania  Central nervous system:  Cranial nerves II through XII intact, tongue/uvula midline, all extremities muscle strength 5/5, sensation intact throughout,  negative dysarthria, negative expressive aphasia, negative receptive  aphasia.  .     Data Reviewed: Care during the described time interval was provided by me .  I have reviewed this patient's available data, including medical history, events of note, physical examination, and all test results as part of my evaluation. I have personally reviewed and interpreted all radiology studies.  CBC:  Recent Labs Lab 10/30/15 0500 10/31/15 0527 11/01/15 0410 11/02/15 0345 11/03/15 0330  WBC 14.6* 11.9* 10.0 8.2 11.3*  HGB 11.5* 11.5* 10.1* 9.6* 10.5*  HCT 36.4* 35.2* 31.7* 30.9* 33.3*  MCV 91.0 87.6 87.8 89.3 89.0  PLT 179 205 176 174 [id number]   Basic Metabolic Panel:  Recent Labs Lab 10/27/15 1124  10/28/15 1250 10/28/15 1736 10/29/15 0459 10/29/15 1601 10/30/15 0500 10/31/15 0527 11/01/15 0410 11/02/15 0345 11/03/15 0330  NA 139  < >  --   --  141  --  144 143 141 141 140  K 5.6*  < >  --   --  4.0  --  3.8 3.6 3.9 4.0 3.9  CL 106  < >  --   --  114*  --  116* 116* 114* 114* 109  CO2 24  < >  --   --  22  --  21* 20* 22 22 23   GLUCOSE 457*  < >  --   --  301*  --  122* 146* 150* 172* 227*  BUN 25*  < >  --   --  19  --  16 18 19 16 18   CREATININE 1.61*  < >  --   --  1.21  --  1.20 1.44* 1.46* 1.55* 1.71*  CALCIUM 8.2*  < >  --   --  7.8*  --  8.1* 8.3* 8.0* 7.9* 7.9*  MG 1.8  --  1.8 2.4 2.2 2.1  --   --  1.8  --   --   PHOS 3.1  --  2.9 2.9 2.4* 2.1*  --   --   --   --   --   < > = values in this interval not displayed. GFR: Estimated Creatinine Clearance: 43.2 mL/min (by C-G formula based on Cr of 1.71). Liver Function Tests:  Recent Labs Lab 10/27/15 1124 11/01/15 0410  AST 60* 21  ALT 27 15*  ALKPHOS 125 140*  BILITOT 1.3* 1.0  PROT 6.3* 5.4*  ALBUMIN 2.8* 2.1*   No results for input(s): LIPASE, AMYLASE in the last 168 hours. No results for input(s): AMMONIA in the last 168 hours. Coagulation Profile:  Recent Labs Lab 10/27/15 1124  INR 1.20   Cardiac Enzymes:  Recent Labs Lab 10/27/15 1124 10/27/15 1530  10/27/15 2047  TROPONINI 20.31* >65.00* >65.00*   BNP (last 3 results) No results for input(s): PROBNP in the last 8760 hours. HbA1C: No results for input(s): HGBA1C in the last 72 hours. CBG:  Recent Labs Lab 11/02/15 0753 11/02/15 0926 11/02/15 1127 11/02/15 1716 11/02/15 2117  GLUCAP 96 194* 184* 216* 249*   Lipid Profile: No results for input(s): CHOL, HDL, LDLCALC, TRIG, CHOLHDL, LDLDIRECT in the last 72 hours. Thyroid Function Tests: No results for input(s): TSH, T4TOTAL, FREET4, T3FREE, THYROIDAB in the last 72 hours. Anemia Panel: No results for input(s): VITAMINB12, FOLATE, FERRITIN, TIBC, IRON, RETICCTPCT in the last 72 hours. Urine analysis:    Component Value Date/Time   COLORURINE YELLOW 10/27/2015 0900   APPEARANCEUR CLOUDY* 10/27/2015 0900   LABSPEC 1.028 10/27/2015 0900   PHURINE 5.0 10/27/2015 0900   GLUCOSEU >1000* 10/27/2015 0900   HGBUR SMALL* 10/27/2015 0900   BILIRUBINUR NEGATIVE 10/27/2015 0900   BILIRUBINUR n 12/25/2011 1325   KETONESUR NEGATIVE 10/27/2015 0900   PROTEINUR NEGATIVE 10/27/2015 0900   PROTEINUR 3+ 12/25/2011 1325   UROBILINOGEN 0.2 12/25/2011 1325   UROBILINOGEN 0.2 09/06/2011 0908   NITRITE POSITIVE* 10/27/2015 0900   NITRITE n 12/25/2011 1325   LEUKOCYTESUR SMALL* 10/27/2015 0900   Sepsis Labs: @LABRCNTIP (procalcitonin:4,lacticidven:4)  ) Recent Results (from the past 240 hour(s))  Blood culture (routine x 2)     Status: None   Collection Time: 10/27/15  8:57 AM  Result Value Ref Range Status   Specimen Description BLOOD LEFT FOREARM  Final   Special Requests BOTTLES DRAWN AEROBIC ONLY  8CC  Final   Culture NO GROWTH 5 DAYS  Final   Report Status 11/01/2015 FINAL  Final  Blood culture (routine x 2)     Status: None   Collection Time: 10/27/15  8:59 AM  Result Value Ref Range Status   Specimen Description BLOOD RIGHT ANTECUBITAL  Final   Special Requests BOTTLES DRAWN AEROBIC AND ANAEROBIC  5CC  Final   Culture NO  GROWTH 5 DAYS  Final   Report Status  11/01/2015 FINAL  Final  Urine culture     Status: Abnormal   Collection Time: 10/27/15  9:00 AM  Result Value Ref Range Status   Specimen Description URINE, CATHETERIZED  Final   Special Requests Normal  Final   Culture >=100,000 COLONIES/mL KLEBSIELLA OXYTOCA (A)  Final   Report Status 10/29/2015 FINAL  Final   Organism ID, Bacteria KLEBSIELLA OXYTOCA (A)  Final      Susceptibility   Klebsiella oxytoca - MIC*    AMPICILLIN 16 RESISTANT Resistant     CEFAZOLIN <=4 SENSITIVE Sensitive     CEFTRIAXONE <=1 SENSITIVE Sensitive     CIPROFLOXACIN <=0.25 SENSITIVE Sensitive     GENTAMICIN <=1 SENSITIVE Sensitive     IMIPENEM <=0.25 SENSITIVE Sensitive     NITROFURANTOIN <=16 SENSITIVE Sensitive     TRIMETH/SULFA <=20 SENSITIVE Sensitive     AMPICILLIN/SULBACTAM 4 SENSITIVE Sensitive     PIP/TAZO <=4 SENSITIVE Sensitive     * >=100,000 COLONIES/mL KLEBSIELLA OXYTOCA  MRSA PCR Screening     Status: None   Collection Time: 10/27/15 11:28 AM  Result Value Ref Range Status   MRSA by PCR NEGATIVE NEGATIVE Final    Comment:        The GeneXpert MRSA Assay (FDA approved for NASAL specimens only), is one component of a comprehensive MRSA colonization surveillance program. It is not intended to diagnose MRSA infection nor to guide or monitor treatment for MRSA infections.   Culture, blood (routine x 2)     Status: None   Collection Time: 10/27/15 11:39 AM  Result Value Ref Range Status   Specimen Description BLOOD LEFT WRIST  Final   Special Requests BOTTLES DRAWN AEROBIC ONLY 5CC  Final   Culture NO GROWTH 5 DAYS  Final   Report Status 11/01/2015 FINAL  Final  Culture, blood (routine x 2)     Status: None   Collection Time: 10/27/15 11:44 AM  Result Value Ref Range Status   Specimen Description BLOOD LEFT HAND  Final   Special Requests IN PEDIATRIC BOTTLE 1CC  Final   Culture NO GROWTH 5 DAYS  Final   Report Status 11/01/2015 FINAL  Final   Culture, respiratory (NON-Expectorated)     Status: None   Collection Time: 10/28/15  1:05 PM  Result Value Ref Range Status   Specimen Description TRACHEAL ASPIRATE  Final   Special Requests Normal  Final   Gram Stain   Final    ABUNDANT WBC PRESENT, PREDOMINANTLY PMN NO SQUAMOUS EPITHELIAL CELLS SEEN NO ORGANISMS SEEN    Culture RARE CANDIDA ALBICANS  Final   Report Status 10/31/2015 FINAL  Final         Radiology Studies: No results found.      Scheduled Meds: . amiodarone  400 mg Oral BID  . antiseptic oral rinse  7 mL Mouth Rinse QID  . aspirin  81 mg Oral Daily  . carvedilol  3.125 mg Oral BID WC  . clopidogrel  75 mg Oral Daily  . digoxin  0.125 mg Oral Daily  . enoxaparin (LOVENOX) injection  40 mg Subcutaneous Daily  . insulin aspart  0-5 Units Subcutaneous QHS  . insulin aspart  0-9 Units Subcutaneous TID WC  . insulin glargine  15 Units Subcutaneous Daily  . mirtazapine  15 mg Oral QHS  . rosuvastatin  40 mg Per Tube q1800  . sodium chloride  1,000 mL Intravenous Once  . spironolactone  12.5 mg Oral Daily  . tamsulosin  0.4 mg Oral Daily  . torsemide  20 mg Oral Daily   Continuous Infusions:     LOS: 7 days    Time spent: 40 minutes    WOODS, Geraldo Docker, MD [facility] Pager [phone]   If 7PM-7AM, please contact night-coverage [email or web address] Password [email or web address] 11/03/2015, 9:09 AM

## 2015-11-03 NOTE — Progress Notes (Signed)
Patient ID: [name] [name], male   DOB: 1945-07-08, 70 y.o.   MRN: [id number]     Advanced Heart Failure Rounding Note  Primary Physician: [name], MD Primary Cardiologist: [place] Electrophysiologist: Caryl Comes   Reason for Consultation: ICD shocks  Subjective:    Admitted to ICU 10/27/15 after being found down at home with ? CVA findings. Intubated for airway protection with vomiting.  Had VT PTA with ICD therapy. Extubated 7/8.   EEG- no seizure activity. Started on keppra by Neurology.  ECHO- 10/29/15 EF 15-20%. Diffuse HK. Akinesis apical myocardium. Grade II DD.  LHC 7/11 1. Prox LAD to Mid LAD lesion, 90% stenosed. 2. Lat 1st Diag lesion, 90% stenosed. 3. Ost 3rd Mrg to 3rd Mrg lesion, 95% stenosed. 4. Ost 2nd Mrg to 2nd Mrg lesion, 60% stenosed. 5. Ost Ramus to Ramus lesion, 90% stenosed. 6. Ost 4th Mrg to 4th Mrg lesion, 70% stenosed. 7. Mid RCA lesion, 100% stenosed. 8. Dist Cx lesion, 90% stenosed. PCI Post intervention, there is a 0% residual stenosis.--   Denies SOB/CP.  PT recommends SNF.    Objective:   Weight Range: 165 lb 3.2 oz (74.934 kg) Body mass index is 21.2 kg/(m^2).   Vital Signs:   Temp:  [98 F (36.7 C)-98.9 F (37.2 C)] 98.5 F (36.9 C) (07/13 0340) Pulse Rate:  [69-86] 79 (07/13 0340) Resp:  [16-29] 20 (07/13 0400) BP: (86-126)/(55-83) 113/74 mmHg (07/13 0400) SpO2:  [87 %-99 %] 97 % (07/13 0400) Weight:  [165 lb 3.2 oz (74.934 kg)] 165 lb 3.2 oz (74.934 kg) (07/13 0340) Last BM Date:  (unknown PTA?)  Weight change: Filed Weights   11/01/15 0400 11/02/15 0318 11/03/15 0340  Weight: 165 lb (74.844 kg) 168 lb 4.8 oz (76.34 kg) 165 lb 3.2 oz (74.934 kg)    Intake/Output:   Intake/Output Summary (Last 24 hours) at 11/03/15 0905 Last data filed at 11/03/15 0700  Gross per 24 hour  Intake    490 ml  Output   2275 ml  Net  -1785 ml     Physical Exam: General: NAD. In bed.  HEENT: Normal  Neck: supple. JVP 7. Carotids 2+ bilat; no  bruits. No thyromegaly or nodule noted. Cor: PMI nondisplaced. Regular rate & rhythm. Regular rate and rhythm, 2/6 systolic murmur LLSB Lungs: Mechanical breath sounds.  Abdomen: soft, NT, ND, no HSM. No bruits or masses. +BS  Extremities: no cyanosis, clubbing, rash, edema. RLE a dn LLE SCDs.   No DP pulse appreciated either side.  Neuro: alert & oriented x person, cranial nerves grossly intact. moves all 4 extremities w/o difficulty. Affect pleasant  Telemetry: Sinus Rhythm 70s.    Labs: CBC  Recent Labs  11/02/15 0345 11/03/15 0330  WBC 8.2 11.3*  HGB 9.6* 10.5*  HCT 30.9* 33.3*  MCV 89.3 89.0  PLT 174 [id number]   Basic Metabolic Panel  Recent Labs  11/01/15 0410 11/02/15 0345 11/03/15 0330  NA 141 141 140  K 3.9 4.0 3.9  CL 114* 114* 109  CO2 22 22 23   GLUCOSE 150* 172* 227*  BUN 19 16 18   CREATININE 1.46* 1.55* 1.71*  CALCIUM 8.0* 7.9* 7.9*  MG 1.8  --   --    Liver Function Tests  Recent Labs  11/01/15 0410  AST 21  ALT 15*  ALKPHOS 140*  BILITOT 1.0  PROT 5.4*  ALBUMIN 2.1*   No results for input(s): LIPASE, AMYLASE in the last 72 hours. Cardiac Enzymes No results for  input(s): CKTOTAL, CKMB, CKMBINDEX, TROPONINI in the last 72 hours.  BNP: BNP (last 3 results)  Recent Labs  04/11/15 0400 07/26/15 1707 10/27/15 1124  BNP 2568.2* 2061.3* 1318.0*    ProBNP (last 3 results) No results for input(s): PROBNP in the last 8760 hours.   D-Dimer No results for input(s): DDIMER in the last 72 hours. Hemoglobin A1C No results for input(s): HGBA1C in the last 72 hours. Fasting Lipid Panel No results for input(s): CHOL, HDL, LDLCALC, TRIG, CHOLHDL, LDLDIRECT in the last 72 hours. Thyroid Function Tests No results for input(s): TSH, T4TOTAL, T3FREE, THYROIDAB in the last 72 hours.  Invalid input(s): FREET3  Other results:     Imaging/Studies:  No results found.  Latest Echo  Latest Cath   Medications:     Scheduled Medications: .  amiodarone  400 mg Oral BID  . antiseptic oral rinse  7 mL Mouth Rinse QID  . aspirin  81 mg Oral Daily  . carvedilol  3.125 mg Oral BID WC  . clopidogrel  75 mg Oral Daily  . digoxin  0.125 mg Oral Daily  . enoxaparin (LOVENOX) injection  40 mg Subcutaneous Daily  . insulin aspart  0-5 Units Subcutaneous QHS  . insulin aspart  0-9 Units Subcutaneous TID WC  . insulin glargine  15 Units Subcutaneous Daily  . mirtazapine  15 mg Oral QHS  . rosuvastatin  40 mg Per Tube q1800  . sodium chloride  1,000 mL Intravenous Once  . spironolactone  12.5 mg Oral Daily  . tamsulosin  0.4 mg Oral Daily  . torsemide  20 mg Oral Daily   Infusions:    PRN Medications: acetaminophen, ondansetron (ZOFRAN) IV, RESOURCE THICKENUP CLEAR, sodium chloride flush   Assessment   1. VT 2. NSTEMI: Now s/p PCI to distal dominant LCx.  3. Chronic systolic HF: St Jude ICD.  4. CAD 5. ?CVA  6. Sepsis 7. Acute respiratory failure requiring intubation: Suspect aspiration pneumonitis/PNA.  8. UTI 9. Seizures  10. CKD: Baseline creatinine around 2.   Plan   He has had a large NSTEMI with VT.  Post LHC showed progressive lesion in distal LCx (dominant vessel).  Diffuse LAD disease and subtotal nondominant RCA occlusion similar to prior.  LAD territory nonviable on prior cMRI.  Had PCI  to distal LCx.    - Will continue Plavix/ASA/statin.   No further VT, now on po amiodarone.  Echo completed 7/8 with severely reduced EF 15-20%, grade II DD. Diffuse HK. This is similar to the past echo. CO-OX has been adequate. - Continue current Coreg, spironolactone, and digoxin.  Digoxin level ok.  - Creatinine 1.4>1.5>1.7. Baseline creatinine seems to have been around 2 in the past.  Hold off on ACEI/ARB.  - Volume looks fine, can decrease torsemide to 20 mg daily.    ? Probable seizures- Neurology following. EEG no seizure activity. On keppra 500 mg twice a day.   PT recommends SNF, need to start screening.  He seems  stable for discharge from cardiac perspective .  Length of Stay: [place]  11/03/2015, 9:05 AM  Advanced Heart Failure Team Pager [phone] (M-F; 7a - 4p)  Please contact [facility] for night-coverage after hours (4p -7a ) and weekends on [email or web address]

## 2015-11-03 NOTE — Care Management Important Message (Signed)
Important Message  Patient Details  Name: [name] [name] MRN: [id number] Date of Birth: [birth year]   Medicare Important Message Given:  Yes    [name] 11/03/2015, 8:17 AM

## 2015-11-03 NOTE — Progress Notes (Signed)
Speech Language Pathology Treatment: Dysphagia  Patient Details Name: [name] [name] MRN: [id number] DOB: 14-Jul-1945 Today's Date: 11/03/2015 Time: 9611-6435 SLP Time Calculation (min) (ACUTE ONLY): 11 min  Assessment / Plan / Recommendation Clinical Impression  F/u diet tolerance assessment complete via skilled observation with self fed po trials utilizing a straw for thin liquids. Baseline congested cough noted which continued minimally during po intake not appearing related to aspiration. Overall, patient utilizing general safe swallowing strategies with min assist, meeting short term goal. Appears to be tolerating diet based on stable lung sounds and temps. No further SLP needs indicated at this time.    HPI HPI: 70 yo M with plethora of health issues, EF 15%, IACD, CVA rt side weakness 2015, IDDM who presents to [facility] ED with 24 hours of increased confusion, rt arm tremor, agitation. Found to have NSTEMI. He vomited and most likely aspirated 7/6 with patchy pna in the right upper lobe and right lower lobe. Intubated from 7/6 to 7/8. Pt has no history of being treated by SLP in past admission. Pt reports occasional coughing/choking prior to admission      SLP Plan  All goals met     Recommendations  Diet recommendations: Dysphagia 3 (mechanical soft);Thin liquid Liquids provided via: Cup;Straw Medication Administration: Whole meds with liquid Supervision: Patient able to self feed;Intermittent supervision to cue for compensatory strategies Compensations: Slow rate;Small sips/bites Postural Changes and/or Swallow Maneuvers: Seated upright 90 degrees;Upright 30-60 min after meal             Oral Care Recommendations: Oral care BID Follow up Recommendations: Skilled Nursing facility Plan: All goals met     Romulus Vaiden, [place] [phone]    Gabriel Rainwater Meryl 11/03/2015, 3:28 PM

## 2015-11-03 NOTE — Progress Notes (Signed)
phy there rec snf, sw ref has been made. Alerted sw after speaking w dr Sherral Hammers will be ready for snf on 7-14.

## 2015-11-04 ENCOUNTER — Inpatient Hospital Stay (HOSPITAL_COMMUNITY): Payer: Commercial Managed Care - HMO

## 2015-11-04 LAB — CBC
HEMATOCRIT: 33 % — AB (ref 39.0–52.0)
HEMOGLOBIN: 10.3 g/dL — AB (ref 13.0–17.0)
MCH: 27.9 pg (ref 26.0–34.0)
MCHC: 31.2 g/dL (ref 30.0–36.0)
MCV: 89.4 fL (ref 78.0–100.0)
Platelets: 267 10*3/uL (ref 150–400)
RBC: 3.69 MIL/uL — AB (ref 4.22–5.81)
RDW: 15.3 % (ref 11.5–15.5)
WBC: 8.5 10*3/uL (ref 4.0–10.5)

## 2015-11-04 LAB — BASIC METABOLIC PANEL
Anion gap: 8 (ref 5–15)
BUN: 15 mg/dL (ref 6–20)
CALCIUM: 7.8 mg/dL — AB (ref 8.9–10.3)
CHLORIDE: 106 mmol/L (ref 101–111)
CO2: 25 mmol/L (ref 22–32)
Creatinine, Ser: 1.78 mg/dL — ABNORMAL HIGH (ref 0.61–1.24)
GFR calc Af Amer: 43 mL/min — ABNORMAL LOW (ref >60)
GFR, EST NON AFRICAN AMERICAN: 37 mL/min — AB (ref >60)
GLUCOSE: 112 mg/dL — AB (ref 65–99)
POTASSIUM: 3.6 mmol/L (ref 3.5–5.1)
SODIUM: 139 mmol/L (ref 135–145)

## 2015-11-04 LAB — GLUCOSE, CAPILLARY
GLUCOSE-CAPILLARY: 205 mg/dL — AB (ref 65–99)
GLUCOSE-CAPILLARY: 209 mg/dL — AB (ref 65–99)
GLUCOSE-CAPILLARY: 91 mg/dL (ref 65–99)
Glucose-Capillary: 82 mg/dL (ref 65–99)
Glucose-Capillary: 285 mg/dL — ABNORMAL HIGH (ref 65–99)
Glucose-Capillary: 219 mg/dL — ABNORMAL HIGH (ref 65–99)

## 2015-11-04 MED ORDER — AMIODARONE HCL 200 MG PO TABS
200.0000 mg | ORAL_TABLET | Freq: Two times a day (BID) | ORAL | Status: DC
  Administered 2015-11-04 – 2015-11-06 (×4): 200 mg via ORAL
  Filled 2015-11-04 (×4): qty 1

## 2015-11-04 NOTE — Clinical Social Work Placement (Addendum)
   CLINICAL SOCIAL WORK PLACEMENT  NOTE  Date:  11/04/2015  Patient Details  Name: [name] [name] MRN: [id number] Date of Birth: May 15, 1945  Clinical Social Work is seeking post-discharge placement for this patient at the [place] level of care (*CSW will initial, date and re-position this form in  chart as items are completed):  Yes   Patient/family provided with [place] Work Department's list of facilities offering this level of care within the geographic area requested by the patient (or if unable, by the patient's family).  Yes   Patient/family informed of their freedom to choose among providers that offer the needed level of care, that participate in Medicare, Medicaid or managed care program needed by the patient, have an available bed and are willing to accept the patient.  Yes   Patient/family informed of [facility]'s ownership interest in [facility] and [facility], as well as of the fact that they are under no obligation to receive care at these facilities.  PASRR submitted to EDS on 11/04/15     PASRR number received on 11/04/15     Existing PASRR number confirmed on       FL2 transmitted to all facilities in geographic area requested by pt/family on 11/04/15     FL2 transmitted to all facilities within larger geographic area on       Patient informed that his/her managed care company has contracts with or will negotiate with certain facilities, including the following:         11-04-15   Patient/family informed of bed offers received.  Patient chooses bed at  [place] recommends and patient chooses bed at      Patient to be transferred to   on  .  Patient to be transferred to facility by       Patient family notified on   of transfer.  Name of family member notified:        PHYSICIAN Please sign FL2     Additional Comment:    _______________________________________________ Ross Ludwig, LCSWA 11/04/2015, 12:12 PM

## 2015-11-04 NOTE — Progress Notes (Addendum)
PROGRESS NOTE    [name] [name]  ZOX:[id number] DOB: 11-15-1945 DOA: 10/27/2015 PCP: [name] [place], MD   Brief Narrative:  70 yo WM PMHx CVA 2013, HTN,Chronic combined systolic and diastolic heart failure (EF 15%), Ischemic cardiomyopathy, CAD native artery S/P Automatic implantable cardioverter-defibrillator,NSVT, MI, OSA, DM Type 2  Who presents to [facility] ED with 24 hours of increased confusion, rt arm tremor, agitation. He vomited and most likely aspirated 7/6 and will require intubation for airway protection. Neuro and Cards will see patient, note he had VT in ER and IACD fired. We will admit to ICU, treat with abx,do nuero and cardiology work ups. He is a full code per wife and family   Assessment & Plan:   Active Problems:   Aspiration pneumonia (HCC)   Respiratory failure (HCC)   NSTEMI (non-ST elevated myocardial infarction) (HCC)   Encounter for central line placement   Acute on chronic respiratory failure with hypoxia (HCC)   Acute on chronic diastolic congestive heart failure (HCC)   PAH (pulmonary artery hypertension) (HCC)   Aspiration pneumonia of right lower lobe due to vomit (HCC)   Systolic and diastolic CHF, chronic (HCC)   HLD (hyperlipidemia)   Acute encephalopathy   Acute renal failure superimposed on stage 3 chronic kidney disease (HCC)   Uncontrolled type 2 diabetes mellitus with complication (HCC)   Systolic and diastolic CHF, acute on chronic (HCC)   Klebsiella infection   Aspiration pneumonia of right upper lobe due to vomit (HCC)   Acute Hypoxic Respiratory Failure Secondary to aspiration pneumonia -Resolved  Large NSTEMI -Peak Troponin > 65  -7/11 Left heart cath multivessel disease with stent placement see results below  Ischemic Cardiomyopathy w/ Systolic and Diastolic CHF -7/8 echocardiogram EF remains 15-20%, and diastolic CHF see results below -Strict in and out since admission + 2.8 L -Daily weight Filed Weights   11/03/15 0340 11/04/15  0402 11/05/15 0407  Weight: 74.934 kg (165 lb 3.2 oz) 71.305 kg (157 lb 3.2 oz) 73.12 kg (161 lb 3.2 oz)  -Stable for discharge per cardiology  Ventricular Tachycardia - IACD Fired in ED 7/6 -Amiodarone 200 mg  BID -Coreg 3.125 mg BID -Digoxin 0.125 mg daily  -Spironolactone 12.5 mg daily -Demadex 20 mg daily  Hyperlipidemia -Crestor 40 mg daily   Probable Partial Seizure -Not on seizure medication    Acute Encephalopathy -Resolved:   Possible New Acute CVA - H/O CVA 2015 - Mild right-sided weakness & slurred speech -Resolved   Acute on CKD III( baseline Cr 1.7-2.2) Lab Results  Component Value Date   CREATININE 1.77* 11/05/2015   CREATININE 1.78* 11/04/2015   CREATININE 1.71* 11/03/2015  -Within baseline  Nutrition - Probable Aspiration -Speech Eval has cleared for D3 with thin liquid diet  Anemia Mild - no signs of active bleeding  Positive Klebsiella UTI -Completed 5 days of antibiotics    DM Type 2 uncontrolled with, complication 7/6 Hemoglobin A1c 12.0  -Increase Lantus 20 units daily -NovoLog 8 units QAC   DVT prophylaxis: Lovenox Code Status: Full Family Communication: Wife present Disposition Plan: Cardiology has cleared awaiting SNF clearance   Consultants:  Dr.Dalton Alford [place] cardiology   Procedures/Significant Events:  7/6 - admitted - V tach in the ED, AICD fired - intubated 7/8 - extubated 7/8 echocardiogram:- Left ventricle: severely dilated.-LVEF= 15% to 20%. Severe diffuse hypokinesis. Akinesis of the apical myocardium. - (grade 2 diastolic dysfunction). - Left atrium: moderately dilated.- Pulmonary arteries: PA peak pressure: 57 mm Hg (S). 7/10 -  TRH assumed care 7/11 left heart cath:-LAD: 1. Prox LAD to Mid LAD lesion, 90% stenosed. 2. Lat 1st Diag lesion, 90% stenosed. 3. Ost 3rd Mrg to 3rd Mrg lesion, 95% stenosed. 4. Ost 2nd Mrg to 2nd Mrg lesion, 60% stenosed. 5. Ost Ramus to Ramus lesion, 90% stenosed. 6. Ost 4th Mrg to 4th  Mrg lesion, 70% stenosed. 7. Mid RCA lesion, 100% stenosed. 8. Dist Cx lesion, 90% stenosed. S/P placement Stent Promus Prem Mr [id number] there is a 0% residual stenosis   Cultures 7/6 urine positive Klebsiella OXYTOCA 7/6 blood left wrist/hand negative 7/7 positive rare Candida albicans   Antimicrobials: Unasyn 7/6>> 7/11   Devices NA   LINES / TUBES:  Left IJ CVL     Continuous Infusions:     Subjective: 7/14 A/O 4, MAXIMUM TEMPERATURE in last 24 hours 38.4 C, positive productive cough yellow, negative leukocytosis     Objective: Filed Vitals:   11/05/15 0400 11/05/15 0407 11/05/15 0500 11/05/15 0700  BP: 113/71 113/71 102/59 101/63  Pulse:      Temp:  99.1 F (37.3 C)  98.6 F (37 C)  TempSrc:  Oral  Oral  Resp: 26 25 22 22   Height:      Weight:  73.12 kg (161 lb 3.2 oz)    SpO2: 97%   93%    Intake/Output Summary (Last 24 hours) at 11/05/15 0858 Last data filed at 11/04/15 2000  Gross per 24 hour  Intake    600 ml  Output    800 ml  Net   -200 ml   Filed Weights   11/03/15 0340 11/04/15 0402 11/05/15 0407  Weight: 74.934 kg (165 lb 3.2 oz) 71.305 kg (157 lb 3.2 oz) 73.12 kg (161 lb 3.2 oz)    Examination:  General:A/O 4, NAD.,Sitting in chair comfortably, No acute respiratory distress Eyes: negative scleral hemorrhage, negative anisocoria, negative icterus ENT: Negative Runny nose, negative gingival bleeding, Neck:  Negative scars, masses, torticollis, lymphadenopathy, JVD, Lungs: Clear to auscultation bilaterally without wheezes or crackles Cardiovascular: Regular rate and rhythm positive systolic murmur , negative gallop or rub normal S1 and S2 Abdomen: negative abdominal pain, nondistended, positive soft, bowel sounds, no rebound, no ascites, no appreciable mass Extremities: No significant cyanosis, clubbing, or edema bilateral lower extremities Skin: Negative rashes, lesions, ulcers Psychiatric:  Negative depression, negative  anxiety, negative fatigue, negative mania  Central nervous system:  Cranial nerves II through XII intact, tongue/uvula midline, all extremities muscle strength 5/5, sensation intact throughout,  negative dysarthria, negative expressive aphasia, negative receptive aphasia.  .     Data Reviewed: Care during the described time interval was provided by me .  I have reviewed this patient's available data, including medical history, events of note, physical examination, and all test results as part of my evaluation. I have personally reviewed and interpreted all radiology studies.  CBC:  Recent Labs Lab 11/01/15 0410 11/02/15 0345 11/03/15 0330 11/04/15 0208 11/05/15 0307  WBC 10.0 8.2 11.3* 8.5 10.0  NEUTROABS  --   --   --   --  6.7  HGB 10.1* 9.6* 10.5* 10.3* 10.1*  HCT 31.7* 30.9* 33.3* 33.0* 32.4*  MCV 87.8 89.3 89.0 89.4 89.5  PLT 176 174 242 267 334   Basic Metabolic Panel:  Recent Labs Lab 10/29/15 1601  11/01/15 0410 11/02/15 0345 11/03/15 0330 11/04/15 0208 11/05/15 0307  NA  --   < > 141 141 140 139 140  K  --   < >  3.9 4.0 3.9 3.6 3.6  CL  --   < > 114* 114* 109 106 109  CO2  --   < > 22 22 23 25 26   GLUCOSE  --   < > 150* 172* 227* 112* 54*  BUN  --   < > 19 16 18 15  21*  CREATININE  --   < > 1.46* 1.55* 1.71* 1.78* 1.77*  CALCIUM  --   < > 8.0* 7.9* 7.9* 7.8* 7.9*  MG 2.1  --  1.8  --   --   --  1.5*  PHOS 2.1*  --   --   --   --   --   --   < > = values in this interval not displayed. GFR: Estimated Creatinine Clearance: 40.7 mL/min (by C-G formula based on Cr of 1.77). Liver Function Tests:  Recent Labs Lab 11/01/15 0410  AST 21  ALT 15*  ALKPHOS 140*  BILITOT 1.0  PROT 5.4*  ALBUMIN 2.1*   No results for input(s): LIPASE, AMYLASE in the last 168 hours. No results for input(s): AMMONIA in the last 168 hours. Coagulation Profile: No results for input(s): INR, PROTIME in the last 168 hours. Cardiac Enzymes: No results for input(s): CKTOTAL,  CKMB, CKMBINDEX, TROPONINI in the last 168 hours. BNP (last 3 results) No results for input(s): PROBNP in the last 8760 hours. HbA1C: No results for input(s): HGBA1C in the last 72 hours. CBG:  Recent Labs Lab 11/05/15 0410 11/05/15 0431 11/05/15 0447 11/05/15 0509 11/05/15 0755  GLUCAP 54* 67 66 116* 123*   Lipid Profile: No results for input(s): CHOL, HDL, LDLCALC, TRIG, CHOLHDL, LDLDIRECT in the last 72 hours. Thyroid Function Tests: No results for input(s): TSH, T4TOTAL, FREET4, T3FREE, THYROIDAB in the last 72 hours. Anemia Panel: No results for input(s): VITAMINB12, FOLATE, FERRITIN, TIBC, IRON, RETICCTPCT in the last 72 hours. Urine analysis:    Component Value Date/Time   COLORURINE YELLOW 10/27/2015 0900   APPEARANCEUR CLOUDY* 10/27/2015 0900   LABSPEC 1.028 10/27/2015 0900   PHURINE 5.0 10/27/2015 0900   GLUCOSEU >1000* 10/27/2015 0900   HGBUR SMALL* 10/27/2015 0900   BILIRUBINUR NEGATIVE 10/27/2015 0900   BILIRUBINUR n 12/25/2011 1325   KETONESUR NEGATIVE 10/27/2015 0900   PROTEINUR NEGATIVE 10/27/2015 0900   PROTEINUR 3+ 12/25/2011 1325   UROBILINOGEN 0.2 12/25/2011 1325   UROBILINOGEN 0.2 09/06/2011 0908   NITRITE POSITIVE* 10/27/2015 0900   NITRITE n 12/25/2011 1325   LEUKOCYTESUR SMALL* 10/27/2015 0900   Sepsis Labs: @LABRCNTIP (procalcitonin:4,lacticidven:4)  ) Recent Results (from the past 240 hour(s))  Blood culture (routine x 2)     Status: None   Collection Time: 10/27/15  8:57 AM  Result Value Ref Range Status   Specimen Description BLOOD LEFT FOREARM  Final   Special Requests BOTTLES DRAWN AEROBIC ONLY  8CC  Final   Culture NO GROWTH 5 DAYS  Final   Report Status 11/01/2015 FINAL  Final  Blood culture (routine x 2)     Status: None   Collection Time: 10/27/15  8:59 AM  Result Value Ref Range Status   Specimen Description BLOOD RIGHT ANTECUBITAL  Final   Special Requests BOTTLES DRAWN AEROBIC AND ANAEROBIC  5CC  Final   Culture NO GROWTH  5 DAYS  Final   Report Status 11/01/2015 FINAL  Final  Urine culture     Status: Abnormal   Collection Time: 10/27/15  9:00 AM  Result Value Ref Range Status   Specimen Description URINE,  CATHETERIZED  Final   Special Requests Normal  Final   Culture >=100,000 COLONIES/mL KLEBSIELLA OXYTOCA (A)  Final   Report Status 10/29/2015 FINAL  Final   Organism ID, Bacteria KLEBSIELLA OXYTOCA (A)  Final      Susceptibility   Klebsiella oxytoca - MIC*    AMPICILLIN 16 RESISTANT Resistant     CEFAZOLIN <=4 SENSITIVE Sensitive     CEFTRIAXONE <=1 SENSITIVE Sensitive     CIPROFLOXACIN <=0.25 SENSITIVE Sensitive     GENTAMICIN <=1 SENSITIVE Sensitive     IMIPENEM <=0.25 SENSITIVE Sensitive     NITROFURANTOIN <=16 SENSITIVE Sensitive     TRIMETH/SULFA <=20 SENSITIVE Sensitive     AMPICILLIN/SULBACTAM 4 SENSITIVE Sensitive     PIP/TAZO <=4 SENSITIVE Sensitive     * >=100,000 COLONIES/mL KLEBSIELLA OXYTOCA  MRSA PCR Screening     Status: None   Collection Time: 10/27/15 11:28 AM  Result Value Ref Range Status   MRSA by PCR NEGATIVE NEGATIVE Final    Comment:        The GeneXpert MRSA Assay (FDA approved for NASAL specimens only), is one component of a comprehensive MRSA colonization surveillance program. It is not intended to diagnose MRSA infection nor to guide or monitor treatment for MRSA infections.   Culture, blood (routine x 2)     Status: None   Collection Time: 10/27/15 11:39 AM  Result Value Ref Range Status   Specimen Description BLOOD LEFT WRIST  Final   Special Requests BOTTLES DRAWN AEROBIC ONLY 5CC  Final   Culture NO GROWTH 5 DAYS  Final   Report Status 11/01/2015 FINAL  Final  Culture, blood (routine x 2)     Status: None   Collection Time: 10/27/15 11:44 AM  Result Value Ref Range Status   Specimen Description BLOOD LEFT HAND  Final   Special Requests IN PEDIATRIC BOTTLE 1CC  Final   Culture NO GROWTH 5 DAYS  Final   Report Status 11/01/2015 FINAL  Final  Culture,  respiratory (NON-Expectorated)     Status: None   Collection Time: 10/28/15  1:05 PM  Result Value Ref Range Status   Specimen Description TRACHEAL ASPIRATE  Final   Special Requests Normal  Final   Gram Stain   Final    ABUNDANT WBC PRESENT, PREDOMINANTLY PMN NO SQUAMOUS EPITHELIAL CELLS SEEN NO ORGANISMS SEEN    Culture RARE CANDIDA ALBICANS  Final   Report Status 10/31/2015 FINAL  Final         Radiology Studies: Dg Chest Port 1 View  11/04/2015  CLINICAL DATA:  Cough and fever EXAM: PORTABLE CHEST 1 VIEW COMPARISON:  10/28/2015 FINDINGS: Cardiac shadow remains enlarged. A defibrillator is again noted and stable. The endotracheal tube and nasogastric catheter have been removed in the interval. Minimal left basilar atelectasis is seen. No bony abnormality is noted. IMPRESSION: Minimal left basilar atelectasis. Electronically Signed   By: Alcide Clever M.D.   On: 11/04/2015 09:40        Scheduled Meds: . amiodarone  200 mg Oral BID  . antiseptic oral rinse  7 mL Mouth Rinse QID  . aspirin  81 mg Oral Daily  . carvedilol  3.125 mg Oral BID WC  . clopidogrel  75 mg Oral Daily  . digoxin  0.125 mg Oral Daily  . enoxaparin (LOVENOX) injection  40 mg Subcutaneous Daily  . insulin aspart  0-15 Units Subcutaneous Q4H  . insulin glargine  20 Units Subcutaneous Daily  . mirtazapine  15  mg Oral QHS  . rosuvastatin  40 mg Per Tube q1800  . sodium chloride  1,000 mL Intravenous Once  . spironolactone  12.5 mg Oral Daily  . tamsulosin  0.4 mg Oral Daily  . torsemide  20 mg Oral Daily   Continuous Infusions:     LOS: 9 days    Time spent: 40 minutes    [name] [name], Roselind Messier, MD [facility] Pager [phone]   If 7PM-7AM, please contact night-coverage [email or web address] Password [facility] 11/05/2015, 8:58 AM

## 2015-11-04 NOTE — Clinical Social Work Note (Addendum)
Clinical Social Work Assessment  Patient Details  Name: [name] [name] MRN: [id number] Date of Birth: March 14, 1946  Date of referral:  11/04/15               Reason for consult:  Facility Placement                Permission sought to share information with:  Facility Sport and exercise psychologist, Family Supports Permission granted to share information::  Yes, Verbal Permission Granted  Name::     [place]::  SNF admissions  Relationship::     Contact Information:     Housing/Transportation Living arrangements for the past 2 months:  [place] of Information:  Patient, Spouse Patient Interpreter Needed:  None Criminal Activity/Legal Involvement Pertinent to Current Situation/Hospitalization:  No - Comment as needed Significant Relationships:  Spouse Lives with:  Spouse Do you feel safe going back to the place where you live?  No Need for family participation in patient care:  Yes (Comment) (Patient requests that his wife help with decision making.)  Care giving concerns:  Patient and wife feel he needs some short term rehab before he is able to go home.  Patient's wife states that she is not able to provide the amount of assistance he needs at home currently due to her own health issues.   Social Worker assessment / plan:  Patient is a 70 year old male who is married and lives with his wife in a house.  Patient is alert and oriented x4 and able to express his feelings, patient was not very talkative, but agreed to go to SNF for short term rehab even though he has never been.  CSW explained to patient and his wife what to expect at SNF and how insurance will pay for the stay if it is approved.  Patient's wife was at bedside and CSW informed her that there is a chance that insurance will not approve, and they would have to pay privately or go home with home health.  Patient's wife expressed understanding of the chance and what options are available  if patient can not go to SNF.  Patient and his wife did not express any other questions or concerns, CSW informed patient what happens day of discharge and if they want to transport patient or if they need EMS.  Patient's wife stated she would rather transport patient to SNF once he is ready for discharge.  Employment status:  Retired Nurse, adult PT Recommendations:  [place] / Referral to community resources:  [place]  Patient/Family's Response to care:  Patient and wife agreeable to SNF for short term rehab.  Patient/Family's Understanding of and Emotional Response to Diagnosis, Current Treatment, and Prognosis:  Patient and family expressed understanding of diagnosis and current treatment plan.  Patient's wife expressed that she feels like patient needs the extra therapy before returning backhome.  Patient also feel like he needs therapy to get his strength up before returning back home.  Patient's wife said she has her own medical needs that she is taking care of and is trying to get well again so she is not able to support him very much at home in his current level of weakness.  Patient's wife expresses that she does not have extra support available at home.  Emotional Assessment Appearance:  Appears stated age Attitude/Demeanor/Rapport:    Affect (typically observed):  Calm, Appropriate, Pleasant, Quiet Orientation:  Oriented to Self, Oriented  to Place, Oriented to  Time, Oriented to Situation Alcohol / Substance use:  Not Applicable Psych involvement (Current and /or in the community):  No (Comment)  Discharge Needs  Concerns to be addressed:  Lack of Support Readmission within the last 30 days:  No Current discharge risk:  Lack of support system Barriers to Discharge:  Continued Medical Work up, [employer]   Ross Ludwig, Gamaliel 11/04/2015, 12:01 PM

## 2015-11-04 NOTE — Progress Notes (Signed)
Physical Therapy Treatment Patient Details Name: [name] [name] MRN: [id number] DOB: 1946-03-08 Today's Date: 11/04/2015    History of Present Illness 69 yo M with plethora of health issues, EF 15%, IACD, CVA rt side weakness 2015, IDDM who presents to [facility] ED with 24 hours of increased confusion, rt arm tremor, agitation. Found to have NSTEMI. He vomited and most likely aspirated 7/6 with patchy pna in the right upper lobe and right lower lobe. Intubated from 7/6 to 7/8    PT Comments    Mr. Arntz continues to present with cognitive impairments with poor safety awareness, placing him at a high risk for falls.  He currently requires assist to steady with static and dynamic activities and staggers Lt/Rt while ambulating.  Continue to recommend SNF at d/c due to pt's current functional status, high fall risk, and impaired cognition.  Pt currently requires +2 assist for safety and is not safe to return home with assist from only his wife at this time.   Follow Up Recommendations  SNF;Supervision/Assistance - 24 hour     Equipment Recommendations  None recommended by PT    Recommendations for Other Services       Precautions / Restrictions Precautions Precautions: Fall Restrictions Weight Bearing Restrictions: No    Mobility  Bed Mobility               General bed mobility comments: Pt sitting in recliner chair upon PT arrival  Transfers Overall transfer level: Needs assistance Equipment used: Rolling walker (2 wheeled) Transfers: Sit to/from Stand Sit to Stand: Min assist;+2 physical assistance         General transfer comment: Assist to steady with transfer and cues for safe technique.  Ambulation/Gait Ambulation/Gait assistance: Min assist;+2 safety/equipment Ambulation Distance (Feet): 90 Feet Assistive device: Rolling walker (2 wheeled) Gait Pattern/deviations: Step-through pattern;Decreased stride length;Staggering left;Staggering right;Narrow base of  support Gait velocity: decreased   General Gait Details: Pt staggers Lt/Rt, requiring assist to steady and for management of RW.  Pt with LOB x1 when turning Rt out of room, pt requires cues for safe RW managment when turning to keep feet within walker so as not to trip.   Stairs            Wheelchair Mobility    Modified Rankin (Stroke Patients Only) Modified Rankin (Stroke Patients Only) Pre-Morbid Rankin Score: No symptoms Modified Rankin: Moderately severe disability     Balance Overall balance assessment: Needs assistance Sitting-balance support: No upper extremity supported;Feet supported Sitting balance-Leahy Scale: Good     Standing balance support: Bilateral upper extremity supported;During functional activity Standing balance-Leahy Scale: Poor Standing balance comment: Relies on RW and assist to steady                    Cognition Arousal/Alertness: Awake/alert Behavior During Therapy: Flat affect Overall Cognitive Status: Impaired/Different from baseline Area of Impairment: Safety/judgement;Awareness;Problem solving;Orientation Orientation Level: Disoriented to;Situation       Safety/Judgement: Decreased awareness of safety;Decreased awareness of deficits Awareness: Emergent Problem Solving: Slow processing;Requires verbal cues General Comments: Pt not oriented to situation, presents with flat affect and demonstrates poor safety awareness.    Exercises      General Comments General comments (skin integrity, edema, etc.): BP sitting at start of session 81/54, standing 80/58, sitting at end of session 100/62.  Pt has been hypotensive majority of day per RN but asymptomatic.      Pertinent Vitals/Pain Pain Assessment: No/denies pain    Home  Living                      Prior Function            PT Goals (current goals can now be found in the care plan section) Acute Rehab PT Goals Patient Stated Goal: none stated PT Goal  Formulation: With patient Time For Goal Achievement: 11/14/15 Potential to Achieve Goals: Good Progress towards PT goals: Progressing toward goals    Frequency  Min 3X/week    PT Plan Current plan remains appropriate    Co-evaluation             End of Session Equipment Utilized During Treatment: Gait belt Activity Tolerance: Patient tolerated treatment well Patient left: with call bell/phone within reach;in chair;with chair alarm set;with family/visitor present     Time: [id number] PT Time Calculation (min) (ACUTE ONLY): 17 min  Charges:  $Gait Training: 8-22 mins                    G Codes:      Collie Siad PT, DPT  Pager: [phone] Phone: [phone] 11/04/2015, 2:28 PM

## 2015-11-04 NOTE — Progress Notes (Signed)
Occupational Therapy Treatment Patient Details Name: [name] [name] MRN: [id number] DOB: [birth year] Today's Date: 11/04/2015    History of present illness 70 yo M with plethora of health issues, EF 15%, IACD, CVA rt side weakness 2015, IDDM who presents to [facility] ED with 24 hours of increased confusion, rt arm tremor, agitation. Found to have NSTEMI. He vomited and most likely aspirated 7/6 with patchy pna in the right upper lobe and right lower lobe. Intubated from 7/6 to 7/8   OT comments  Pt demonstrate improving control and coordination Rt UE and ability to use it more functionally.  He requires min A for ADLs.  He is at high risk for falls due to impaired balance and poor safety awareness.  Recommend SNF for rehab.   Follow Up Recommendations  SNF;Supervision/Assistance - 24 hour    Equipment Recommendations  None recommended by OT    Recommendations for Other Services      Precautions / Restrictions Precautions Precautions: Fall Restrictions Weight Bearing Restrictions: No       Mobility Bed Mobility               General bed mobility comments: Pt sitting in recliner chair upon PT arrival  Transfers Overall transfer level: Needs assistance Equipment used: Rolling walker (2 wheeled) Transfers: Sit to/from Stand Sit to Stand: Min assist         General transfer comment: assist to steady     Balance Overall balance assessment: Needs assistance Sitting-balance support: Feet supported Sitting balance-Leahy Scale: Good     Standing balance support: Bilateral upper extremity supported Standing balance-Leahy Scale: Poor Standing balance comment: reliant on UE support                    ADL Overall ADL's : Needs assistance/impaired Eating/Feeding: Set up;Supervision/ safety;Sitting Eating/Feeding Details (indicate cue type and reason): Pt able to drink from cup using Rt UE, but does fatigue                  Lower Body Dressing: Min guard;Sit  to/from stand Lower Body Dressing Details (indicate cue type and reason): while donning socks it was noted that pt has small area of blood on Rt sock.  Feet inspected and pt with small ulcerated area within what appears to be a bunion - RN notified and in to assess feet  Toilet Transfer: Minimal assistance;Cueing for safety;Ambulation;Regular Teacher, adult education Details (indicate cue type and reason): requires assist for balance and safety                   Vision                     Perception     Praxis      Cognition   Behavior During Therapy: Flat affect Overall Cognitive Status: Impaired/Different from baseline Area of Impairment: Safety/judgement;Awareness;Problem solving;Orientation Orientation Level: Disoriented to;Situation Current Attention Level: Selective Memory: Decreased short-term memory    Safety/Judgement: Decreased awareness of safety Awareness: Emergent Problem Solving: Slow processing;Decreased initiation;Requires verbal cues General Comments: Pt not oriented to situation, presents with flat affect and demonstrates poor safety awareness.    Extremity/Trunk Assessment               Exercises Other Exercises Other Exercises: Pt with dysmetria Rt UE.  Worked on active reaching Rt UE in all planes.  He was able to retrieve a cup of water from overhead without spilling, but fatigues  Shoulder Instructions       General Comments      Pertinent Vitals/ Pain       Pain Assessment: No/denies pain  Home Living                                          Prior Functioning/Environment              Frequency Min 2X/week     Progress Toward Goals  OT Goals(current goals can now be found in the care plan section)  Progress towards OT goals: Progressing toward goals  Acute Rehab OT Goals Patient Stated Goal: none stated ADL Goals Pt Will Perform Grooming: with min guard assist;standing Pt Will Perform Upper  Body Bathing: standing;sitting;with min guard assist Pt Will Perform Lower Body Bathing: with min guard assist;sit to/from stand Pt Will Perform Upper Body Dressing: with set-up;with supervision;sitting Pt Will Perform Lower Body Dressing: with min guard assist;sit to/from stand Pt Will Transfer to Toilet: with min guard assist;ambulating;bedside commode Pt Will Perform Toileting - Clothing Manipulation and hygiene: with min guard assist;sit to/from stand Pt/caregiver will Perform Home Exercise Program: Increased ROM;Increased strength;Right Upper extremity;With written HEP provided  Plan Discharge plan remains appropriate;Equipment recommendations need to be updated    Co-evaluation                 End of Session     Activity Tolerance Patient tolerated treatment well   Patient Left in chair;with call bell/phone within reach;with bed alarm set;with family/visitor present   Nurse Communication Mobility status;Other (comment) (ulcerated area Rt lateral foot )        Time: 1707 (9811)-9147 OT Time Calculation (min): 35 min  Charges: OT General Charges $OT Visit: 1 Procedure OT Treatments $Self Care/Home Management : 8-22 mins $Neuromuscular Re-education: 8-22 mins  Asmaa Tirpak M 11/04/2015, 5:52 PM

## 2015-11-04 NOTE — Clinical Social Work Note (Addendum)
CSW presented bed offers to patient, patient chose [facility].  CSW contacted [facility] who said they can accept patient pending insurance approval and once patient is medically ready for discharge.  CSW contacted insurance company awaiting authorization for patient to go to SNF.  CSW explained to patient and his wife that if patient is not approved for SNF, they will have to consider either paying privately or going home with home health.  Patient and wife are aware of this possibility, patient's wife is concerned that if patient needs to discharge home, she may not be able to provide appropriate support for patient.  CSW awaiting updated PT notes for patient as well for insurance company to review.  CSW continuing to follow patient's progress throughout discharge planning.  3:15pm  CSW received authorization from [employer].  Josem Kaufmann number is [id number], paperwork has been completed at [facility], and they can accept patient over the weekend if he is medically ready for discharge and orders have been received.  CSW updated patient's wife who was appreciative of the information given.  Jones Broom. [place], MSW, [place] 11/04/2015 2:00 PM

## 2015-11-04 NOTE — NC FL2 (Signed)
[place] MEDICAID FL2 LEVEL OF CARE SCREENING TOOL     IDENTIFICATION  Patient Name: [name] [name] Birthdate: May 28, 1945 Sex: male Admission Date (Current Location): 10/27/2015  [facility] and [place] Number:  Herbalist and Address:  [facility], [place] [address] [place], [place] [address]      Provider Number: [id number]  Attending Physician Name and Address:  Allie Bossier, MD  Relative Name and Phone Number:  [place],[phone]    Current Level of Care: Hospital Recommended Level of Care: [place] Prior Approval Number:    Date Approved/Denied:   PASRR Number: [id number]  Discharge Plan: SNF    Current Diagnoses: Patient Active Problem List   Diagnosis Date Noted  . Systolic and diastolic CHF, acute on chronic ([place])   . Klebsiella infection   . Aspiration pneumonia of right lower lobe due to vomit ([place])   . Systolic and diastolic CHF, chronic ([place])   . HLD (hyperlipidemia)   . Acute encephalopathy   . Acute renal failure superimposed on stage 3 chronic kidney disease ([place])   . Uncontrolled type 2 diabetes mellitus with complication ([place])   . Aspiration pneumonia ([place]) 10/27/2015  . Respiratory failure ([place]) 10/27/2015  . Ventricular tachycardia ([place])   . Acute respiratory failure with hypoxia ([place])   . NSTEMI (non-ST elevated myocardial infarction) ([place])   . Encounter for central line placement   . Acute on chronic respiratory failure with hypoxia ([place])   . Acute on chronic diastolic congestive heart failure ([place])   . PAH (pulmonary artery hypertension) ([place])   . CKD (chronic kidney disease) stage 3, GFR 30-59 ml/min 08/16/2015  . Acute on chronic systolic and diastolic heart failure, NYHA class 4 ([place]) 07/26/2015  . VF (ventricular fibrillation) ([place]) 05/20/2015  . Acute on chronic systolic heart failure ([place]) 04/13/2015  . UTI (lower urinary tract infection) 04/10/2015  . Syncope 04/10/2015   . Fatigue, possible anginal equivalent  12/31/2013  . CAD, multiple vessel, RCA 100% mid occl.; LAD 99% stenosisprox.  mid of 50-60%; LCX OM-1 90%, OM-2 99%,AV groove 80%  12/10/2013  . MVP (mitral valve prolapse) 12/01/2013  . Proteinuria 02/25/2013  . Type 2 diabetes mellitus with diabetic nephropathy ([place]) 02/25/2013  . Chronic renal insufficiency, stage III (moderate) 02/25/2013  . Automatic implantable cardioverter-defibrillator in situ 05/06/2012  . Polyneuropathy due to secondary diabetes mellitus ([place]) 04/21/2012  . Urinary outflow obstruction 12/25/2011  . Dysarthria 09/06/2011  . Chronic combined systolic and diastolic heart failure ([place]) 10/11/2009  . GOUT 10/07/2007  . Type II diabetes mellitus with neurological manifestations, uncontrolled ([place]) 08/12/2007  . HYPERLIPIDEMIA 04/11/2007  . Ischemic cardiomyopathy 04/11/2007  . Obstructive sleep apnea 02/27/2007  . Essential hypertension 02/27/2007  . Old Anterior MI (myocardial infarction) 02/27/2007    Orientation RESPIRATION BLADDER Height & Weight     Self, Time, Situation, Place  Normal Continent Weight: 157 lb 3.2 oz (71.305 kg) Height:  6\' 2"  (188 cm)  BEHAVIORAL SYMPTOMS/MOOD NEUROLOGICAL BOWEL NUTRITION STATUS      Continent Diet (Carb Modified)  Dysphagia 3 (mechanical soft);Thin liquid  AMBULATORY STATUS COMMUNICATION OF NEEDS Skin   Limited Assist Verbally PU Stage and Appropriate Care   PU Stage 2 Dressing:  (Once Every 5 days)                   Personal Care Assistance Level of Assistance  Bathing, Dressing Bathing Assistance: Limited assistance   Dressing Assistance: Limited assistance  Functional Limitations Info  Sight, Hearing, Speech Sight Info: Adequate Hearing Info: Adequate Speech Info: Adequate    SPECIAL CARE FACTORS FREQUENCY  PT (By licensed PT)  OT (By licensed OT)  Minimum 3x a week.  Minimum 2x a week                 Contractures      Additional Factors Info   Allergies, Code Status, Insulin Sliding Scale Code Status Info: Full Code Allergies Info: NKA   Insulin Sliding Scale Info: 4x a day with meals       Current Medications (11/04/2015):  This is the current hospital active medication list Current Facility-Administered Medications  Medication Dose Route Frequency Provider Last Rate Last Dose  . acetaminophen (TYLENOL) tablet 650 mg  650 mg Oral Q4H PRN Belva Crome, MD   650 mg at 11/04/15 0151  . amiodarone (PACERONE) tablet 200 mg  200 mg Oral BID Larey Dresser, MD      . antiseptic oral rinse solution (CORINZ)  7 mL Mouth Rinse QID Javier Glazier, MD   7 mL at 11/03/15 1716  . aspirin chewable tablet 81 mg  81 mg Oral Daily Belva Crome, MD   81 mg at 11/04/15 1019  . carvedilol (COREG) tablet 3.125 mg  3.125 mg Oral BID WC Jolaine Artist, MD   3.125 mg at 11/04/15 0753  . clopidogrel (PLAVIX) tablet 75 mg  75 mg Oral Daily Larey Dresser, MD   75 mg at 11/04/15 1019  . digoxin (LANOXIN) tablet 0.125 mg  0.125 mg Oral Daily Amy D Clegg, NP   0.125 mg at 11/04/15 1019  . enoxaparin (LOVENOX) injection 40 mg  40 mg Subcutaneous Daily Allie Bossier, MD   40 mg at 11/04/15 1020  . insulin aspart (novoLOG) injection 0-15 Units  0-15 Units Subcutaneous Q4H Allie Bossier, MD   8 Units at 11/04/15 1152  . insulin glargine (LANTUS) injection 20 Units  20 Units Subcutaneous Daily Allie Bossier, MD   20 Units at 11/04/15 1020  . mirtazapine (REMERON) tablet 15 mg  15 mg Oral QHS Cherene Altes, MD   15 mg at 11/03/15 2242  . ondansetron (ZOFRAN) injection 4 mg  4 mg Intravenous Q6H PRN Belva Crome, MD      . RESOURCE THICKENUP CLEAR   Oral PRN Juanito Doom, MD      . rosuvastatin (CRESTOR) tablet 40 mg  40 mg Per Tube q1800 Javier Glazier, MD   40 mg at 11/03/15 1711  . sodium chloride 0.9 % bolus 1,000 mL  1,000 mL Intravenous Once Grace Bushy Minor, NP   1,000 mL at 10/27/15 1038  . sodium chloride flush (NS) 0.9 %  injection 10-40 mL  10-40 mL Intracatheter PRN Cherene Altes, MD      . spironolactone (ALDACTONE) tablet 12.5 mg  12.5 mg Oral Daily Amy D Clegg, NP   12.5 mg at 11/04/15 1019  . tamsulosin (FLOMAX) capsule 0.4 mg  0.4 mg Oral Daily Javier Glazier, MD   0.4 mg at 11/04/15 1019  . torsemide (DEMADEX) tablet 20 mg  20 mg Oral Daily Larey Dresser, MD   20 mg at 11/04/15 1019     Discharge Medications: Please see discharge summary for a list of discharge medications.  Relevant Imaging Results:  Relevant Lab Results:   Additional Information SSN [id number]  Ross Ludwig, [place]

## 2015-11-04 NOTE — Progress Notes (Signed)
Patient ID: [name] [name], male   DOB: 1945-11-24, 70 y.o.   MRN: [id number]     Advanced Heart Failure Rounding Note  Primary Physician: [name], MD Primary Cardiologist: [place] Electrophysiologist: Caryl Comes   Reason for Consultation: ICD shocks  Subjective:    Admitted to ICU 10/27/15 after being found down at home with ? CVA findings. Intubated for airway protection with vomiting.  Had VT PTA with ICD therapy. Extubated 7/8.   EEG- no seizure activity. Started on keppra by Neurology.  ECHO- 10/29/15 EF 15-20%. Diffuse HK. Akinesis apical myocardium. Grade II DD.  LHC 7/11 1. Prox LAD to Mid LAD lesion, 90% stenosed. 2. Lat 1st Diag lesion, 90% stenosed. 3. Ost 3rd Mrg to 3rd Mrg lesion, 95% stenosed. 4. Ost 2nd Mrg to 2nd Mrg lesion, 60% stenosed. 5. Ost Ramus to Ramus lesion, 90% stenosed. 6. Ost 4th Mrg to 4th Mrg lesion, 70% stenosed. 7. Mid RCA lesion, 100% stenosed. 8. Dist Cx lesion, 90% stenosed. PCI Post intervention, there is a 0% residual stenosis.--   Denies SOB/CP.  PT recommends SNF.  Had fever to 101 last night, some coughing.  Weight down and creatinine stable at 1.7.    Objective:   Weight Range: 157 lb 3.2 oz (71.305 kg) Body mass index is 20.17 kg/(m^2).   Vital Signs:   Temp:  [97.7 F (36.5 C)-101.1 F (38.4 C)] 97.7 F (36.5 C) (07/14 0819) Pulse Rate:  [67-82] 67 (07/14 0402) Resp:  [12-28] 12 (07/14 0819) BP: (99-115)/(59-86) 115/69 mmHg (07/14 0819) SpO2:  [96 %-99 %] 99 % (07/14 0819) Weight:  [157 lb 3.2 oz (71.305 kg)] 157 lb 3.2 oz (71.305 kg) (07/14 0402) Last BM Date:  (unknown PTA?)  Weight change: Filed Weights   11/02/15 0318 11/03/15 0340 11/04/15 0402  Weight: 168 lb 4.8 oz (76.34 kg) 165 lb 3.2 oz (74.934 kg) 157 lb 3.2 oz (71.305 kg)    Intake/Output:   Intake/Output Summary (Last 24 hours) at 11/04/15 0915 Last data filed at 11/04/15 0800  Gross per 24 hour  Intake    258 ml  Output   1535 ml  Net  -1277 ml      Physical Exam: General: NAD. In bed.  HEENT: Normal  Neck: supple. JVP 7. Carotids 2+ bilat; no bruits. No thyromegaly or nodule noted. Cor: PMI nondisplaced. Regular rate & rhythm. Regular rate and rhythm, 2/6 systolic murmur LLSB Lungs: Crackles at bases bilaterally.   Abdomen: soft, NT, ND, no HSM. No bruits or masses. +BS  Extremities: no cyanosis, clubbing, rash, edema. SCDs.   No DP pulse appreciated either side.  Neuro: alert & oriented x person, cranial nerves grossly intact. moves all 4 extremities w/o difficulty. Affect pleasant  Telemetry: Sinus Rhythm 70s.    Labs: CBC  Recent Labs  11/03/15 0330 11/04/15 0208  WBC 11.3* 8.5  HGB 10.5* 10.3*  HCT 33.3* 33.0*  MCV 89.0 89.4  PLT 242 [id number]   Basic Metabolic Panel  Recent Labs  11/03/15 0330 11/04/15 0208  NA 140 139  K 3.9 3.6  CL 109 106  CO2 23 25  GLUCOSE 227* 112*  BUN 18 15  CREATININE 1.71* 1.78*  CALCIUM 7.9* 7.8*   Liver Function Tests No results for input(s): AST, ALT, ALKPHOS, BILITOT, PROT, ALBUMIN in the last 72 hours. No results for input(s): LIPASE, AMYLASE in the last 72 hours. Cardiac Enzymes No results for input(s): CKTOTAL, CKMB, CKMBINDEX, TROPONINI in the last 72 hours.  BNP: BNP (  last 3 results)  Recent Labs  04/11/15 0400 07/26/15 1707 10/27/15 1124  BNP 2568.2* 2061.3* 1318.0*    ProBNP (last 3 results) No results for input(s): PROBNP in the last 8760 hours.   D-Dimer No results for input(s): DDIMER in the last 72 hours. Hemoglobin A1C No results for input(s): HGBA1C in the last 72 hours. Fasting Lipid Panel No results for input(s): CHOL, HDL, LDLCALC, TRIG, CHOLHDL, LDLDIRECT in the last 72 hours. Thyroid Function Tests No results for input(s): TSH, T4TOTAL, T3FREE, THYROIDAB in the last 72 hours.  Invalid input(s): FREET3  Other results:     Imaging/Studies:  No results found.  Latest Echo  Latest Cath   Medications:     Scheduled  Medications: . amiodarone  400 mg Oral BID  . antiseptic oral rinse  7 mL Mouth Rinse QID  . aspirin  81 mg Oral Daily  . carvedilol  3.125 mg Oral BID WC  . clopidogrel  75 mg Oral Daily  . digoxin  0.125 mg Oral Daily  . enoxaparin (LOVENOX) injection  40 mg Subcutaneous Daily  . insulin aspart  0-15 Units Subcutaneous Q4H  . insulin glargine  20 Units Subcutaneous Daily  . mirtazapine  15 mg Oral QHS  . rosuvastatin  40 mg Per Tube q1800  . sodium chloride  1,000 mL Intravenous Once  . spironolactone  12.5 mg Oral Daily  . tamsulosin  0.4 mg Oral Daily  . torsemide  20 mg Oral Daily   Infusions:    PRN Medications: acetaminophen, ondansetron (ZOFRAN) IV, RESOURCE THICKENUP CLEAR, sodium chloride flush   Assessment   1. VT 2. NSTEMI: Now s/p PCI to distal dominant LCx.  3. Chronic systolic HF: St Jude ICD.  4. CAD 5. ?CVA  6. Sepsis 7. Acute respiratory failure requiring intubation: Suspect aspiration pneumonitis/PNA.  8. UTI 9. Seizures  10. CKD: Baseline creatinine around 2.   Plan   He has had a large NSTEMI with VT.  Post LHC showed progressive lesion in distal LCx (dominant vessel).  Diffuse LAD disease and subtotal nondominant RCA occlusion similar to prior.  LAD territory nonviable on prior cMRI.  Had PCI  to distal LCx.    - Will continue Plavix/ASA/statin.   No further VT, now on po amiodarone.  Can decrease amiodarone to 200 mg bid.  Echo completed 7/8 with severely reduced EF 15-20%, grade II DD. Diffuse HK. This is similar to the past echo. CO-OX has been adequate. - Continue current Coreg, spironolactone, and digoxin.  Digoxin level ok.  - Creatinine 1.4>1.5>1.7>1.7. Baseline creatinine seems to have been around 2 in the past.  Hold off on ACEI/ARB.  - Volume looks fine with torsemide 20 mg daily.    Fever to 101 last night, WBCs normal.  Now afebrile.  Some coughing, on aspiration precautions.  He completed a course of Unasyn for aspiration PNA and had  Klebsiella UTI.   - Will order CXR. - Will culture blood and urine.   PT recommends SNF.  Length of Stay: [place]  11/04/2015, 9:15 AM  Advanced Heart Failure Team Pager [phone] (M-F; 7a - 4p)  Please contact [facility] for night-coverage after hours (4p -7a ) and weekends on [email or web address]

## 2015-11-04 NOTE — Progress Notes (Signed)
CARDIAC REHAB PHASE I   PRE:  Rate/Rhythm: 68 SR    BP: sitting 107/64    SaO2: 97 RA  MODE:  Ambulation: 350 ft   POST:  Rate/Rhythm: 92 SR    BP: sitting 120/67     SaO2: 93 RA  Pt able to get out of bed and walk with min assist. Used RW, GB, assist x2 for safety. General weakness and pt did trip on RW when backing in to recliner on return. No c/o, sts he feels well. VSS. Will continue to follow. Would prefer to do ed with wife. [place], ACSM 11/04/2015 9:25 AM

## 2015-11-05 DIAGNOSIS — E1121 Type 2 diabetes mellitus with diabetic nephropathy: Secondary | ICD-10-CM | POA: Diagnosis present

## 2015-11-05 DIAGNOSIS — E11621 Type 2 diabetes mellitus with foot ulcer: Secondary | ICD-10-CM

## 2015-11-05 DIAGNOSIS — J690 Pneumonitis due to inhalation of food and vomit: Secondary | ICD-10-CM | POA: Diagnosis present

## 2015-11-05 DIAGNOSIS — N179 Acute kidney failure, unspecified: Secondary | ICD-10-CM

## 2015-11-05 DIAGNOSIS — N183 Chronic kidney disease, stage 3 (moderate): Secondary | ICD-10-CM

## 2015-11-05 DIAGNOSIS — Z794 Long term (current) use of insulin: Secondary | ICD-10-CM

## 2015-11-05 DIAGNOSIS — L97529 Non-pressure chronic ulcer of other part of left foot with unspecified severity: Secondary | ICD-10-CM

## 2015-11-05 LAB — CBC WITH DIFFERENTIAL/PLATELET
BASOS ABS: 0 10*3/uL (ref 0.0–0.1)
Basophils Absolute: 0 10*3/uL (ref 0.0–0.1)
Basophils Relative: 0 %
Basophils Relative: 0 %
EOS ABS: 0.2 10*3/uL (ref 0.0–0.7)
EOS PCT: 3 %
Eosinophils Absolute: 0.4 10*3/uL (ref 0.0–0.7)
Eosinophils Relative: 4 %
HCT: 32.4 % — ABNORMAL LOW (ref 39.0–52.0)
HCT: 32.9 % — ABNORMAL LOW (ref 39.0–52.0)
HEMOGLOBIN: 10.1 g/dL — AB (ref 13.0–17.0)
HEMOGLOBIN: 10.2 g/dL — AB (ref 13.0–17.0)
LYMPHS PCT: 17 %
Lymphocytes Relative: 12 %
Lymphs Abs: 1.7 10*3/uL (ref 0.7–4.0)
Lymphs Abs: 0.9 10*3/uL (ref 0.7–4.0)
MCH: 27.9 pg (ref 26.0–34.0)
MCH: 27.9 pg (ref 26.0–34.0)
MCHC: 31.2 g/dL (ref 30.0–36.0)
MCHC: 31 g/dL (ref 30.0–36.0)
MCV: 89.5 fL (ref 78.0–100.0)
MCV: 90.1 fL (ref 78.0–100.0)
MONO ABS: 1.1 10*3/uL — AB (ref 0.1–1.0)
MONOS PCT: 11 %
Monocytes Absolute: 1 10*3/uL (ref 0.1–1.0)
Monocytes Relative: 13 %
NEUTROS ABS: 6.7 10*3/uL (ref 1.7–7.7)
NEUTROS PCT: 72 %
Neutro Abs: 5.7 10*3/uL (ref 1.7–7.7)
Neutrophils Relative %: 68 %
PLATELETS: 334 10*3/uL (ref 150–400)
Platelets: 305 10*3/uL (ref 150–400)
RBC: 3.62 MIL/uL — ABNORMAL LOW (ref 4.22–5.81)
RBC: 3.65 MIL/uL — AB (ref 4.22–5.81)
RDW: 15.3 % (ref 11.5–15.5)
RDW: 15.4 % (ref 11.5–15.5)
WBC: 10 10*3/uL (ref 4.0–10.5)
WBC: 7.9 10*3/uL (ref 4.0–10.5)

## 2015-11-05 LAB — EXPECTORATED SPUTUM ASSESSMENT W REFEX TO RESP CULTURE

## 2015-11-05 LAB — GLUCOSE, CAPILLARY
GLUCOSE-CAPILLARY: 136 mg/dL — AB (ref 65–99)
GLUCOSE-CAPILLARY: 190 mg/dL — AB (ref 65–99)
GLUCOSE-CAPILLARY: 66 mg/dL (ref 65–99)
Glucose-Capillary: 54 mg/dL — ABNORMAL LOW (ref 65–99)
Glucose-Capillary: 116 mg/dL — ABNORMAL HIGH (ref 65–99)
Glucose-Capillary: 67 mg/dL (ref 65–99)
Glucose-Capillary: 123 mg/dL — ABNORMAL HIGH (ref 65–99)
Glucose-Capillary: 238 mg/dL — ABNORMAL HIGH (ref 65–99)
Glucose-Capillary: 205 mg/dL — ABNORMAL HIGH (ref 65–99)

## 2015-11-05 LAB — BASIC METABOLIC PANEL
ANION GAP: 5 (ref 5–15)
Anion gap: 6 (ref 5–15)
BUN: 20 mg/dL (ref 4–21)
BUN: 20 mg/dL (ref 6–20)
BUN: 21 mg/dL — ABNORMAL HIGH (ref 6–20)
CALCIUM: 7.9 mg/dL — AB (ref 8.9–10.3)
CHLORIDE: 109 mmol/L (ref 101–111)
CO2: 26 mmol/L (ref 22–32)
CO2: 29 mmol/L (ref 22–32)
CREATININE: 1.89 mg/dL — AB (ref 0.61–1.24)
Calcium: 7.9 mg/dL — ABNORMAL LOW (ref 8.9–10.3)
Chloride: 105 mmol/L (ref 101–111)
Creatinine, Ser: 1.77 mg/dL — ABNORMAL HIGH (ref 0.61–1.24)
Creatinine: 1.9 mg/dL — AB (ref 0.6–1.3)
GFR calc non Af Amer: 37 mL/min — ABNORMAL LOW (ref >60)
GFR calc non Af Amer: 35 mL/min — ABNORMAL LOW (ref >60)
GFR, EST AFRICAN AMERICAN: 43 mL/min — AB (ref >60)
GFR, EST AFRICAN AMERICAN: 40 mL/min — AB (ref >60)
GLUCOSE: 181 mg/dL
GLUCOSE: 54 mg/dL — AB (ref 65–99)
Glucose, Bld: 181 mg/dL — ABNORMAL HIGH (ref 65–99)
Potassium: 3.6 mmol/L (ref 3.5–5.1)
Potassium: 3.7 mmol/L (ref 3.4–5.3)
Potassium: 3.7 mmol/L (ref 3.5–5.1)
SODIUM: 140 mmol/L (ref 135–145)
SODIUM: 140 mmol/L (ref 137–147)
Sodium: 140 mmol/L (ref 135–145)

## 2015-11-05 LAB — URINE CULTURE: CULTURE: NO GROWTH

## 2015-11-05 LAB — MAGNESIUM
MAGNESIUM: 1.5 mg/dL — AB (ref 1.7–2.4)
MAGNESIUM: 1.6 mg/dL — AB (ref 1.7–2.4)

## 2015-11-05 LAB — EXPECTORATED SPUTUM ASSESSMENT W GRAM STAIN, RFLX TO RESP C

## 2015-11-05 LAB — CBC AND DIFFERENTIAL
HEMATOCRIT: 33 % — AB (ref 41–53)
HEMOGLOBIN: 10.2 g/dL — AB (ref 13.5–17.5)
PLATELETS: 305 10*3/uL (ref 150–399)
WBC: 7.9 10^3/mL

## 2015-11-05 MED ORDER — DEXTROSE 50 % IV SOLN
INTRAVENOUS | Status: AC
  Administered 2015-11-05: 25 mL
  Filled 2015-11-05: qty 50

## 2015-11-05 MED ORDER — COLLAGENASE 250 UNIT/GM EX OINT
TOPICAL_OINTMENT | Freq: Two times a day (BID) | CUTANEOUS | Status: DC
  Administered 2015-11-05: 17:00:00 via TOPICAL
  Administered 2015-11-06: 1 via TOPICAL
  Filled 2015-11-05: qty 30

## 2015-11-05 MED ORDER — IPRATROPIUM-ALBUTEROL 0.5-2.5 (3) MG/3ML IN SOLN: 3.0000 mL | Freq: Four times a day (QID) | RESPIRATORY_TRACT | Status: DC

## 2015-11-05 MED ORDER — DM-GUAIFENESIN ER 30-600 MG PO TB12
1.0000 | ORAL_TABLET | Freq: Two times a day (BID) | ORAL | Status: DC
  Administered 2015-11-05 – 2015-11-06 (×2): 1 via ORAL
  Filled 2015-11-05 (×3): qty 1

## 2015-11-05 MED ORDER — IPRATROPIUM-ALBUTEROL 0.5-2.5 (3) MG/3ML IN SOLN
3.0000 mL | Freq: Four times a day (QID) | RESPIRATORY_TRACT | Status: DC
  Administered 2015-11-05 – 2015-11-06 (×3): 3 mL via RESPIRATORY_TRACT
  Filled 2015-11-05 (×3): qty 3

## 2015-11-05 NOTE — Progress Notes (Signed)
CARDIAC REHAB PHASE I   PRE:  Rate/Rhythm: 68 SR  BP:  Supine: 99/61  Sitting:   Standing:    SaO2: 95% RA  MODE:  Ambulation: 350 ft   POST:  Rate/Rhythm: 88 SR  BP:  Supine:   Sitting: 111/66  Standing:    SaO2: 95% RA  1112-1157 Pt tolerated ambulation well with assist x1, gait belt, and pushing rolling walker. Gait slow, steady, 1 standing rest break taken. To chair after walk with legs elevated. MI/PCI education completed with pt and pt's wife including, restrictions, risk factors, ASA/Plavix use, CP, NTG and calling 911, heart healthy and diabetic diet sheet given and activity progress/exercise guidelines given. Pt verbalizes understanding of instructions given. Phase 2 cardiac rehab discussed, and pt will be referred to the CR program at [facility].  [place] Carol

## 2015-11-05 NOTE — Progress Notes (Signed)
Patient ID: [name] [name], male   DOB: February 28, 1946, 70 y.o.   MRN: [id number]     Advanced Heart Failure Rounding Note  Primary Physician: [name], MD Primary Cardiologist: [place] Electrophysiologist: Caryl Comes   Reason for Consultation: ICD shocks  Subjective:    Admitted to ICU 10/27/15 after being found down at home with ? CVA findings. Intubated for airway protection with vomiting.  Had VT PTA with ICD therapy. Extubated 7/8.   EEG- no seizure activity. Started on keppra by Neurology.  ECHO- 10/29/15 EF 15-20%. Diffuse HK. Akinesis apical myocardium. Grade II DD.  LHC 7/11 1. Prox LAD to Mid LAD lesion, 90% stenosed. 2. Lat 1st Diag lesion, 90% stenosed. 3. Ost 3rd Mrg to 3rd Mrg lesion, 95% stenosed. 4. Ost 2nd Mrg to 2nd Mrg lesion, 60% stenosed. 5. Ost Ramus to Ramus lesion, 90% stenosed. 6. Ost 4th Mrg to 4th Mrg lesion, 70% stenosed. 7. Mid RCA lesion, 100% stenosed. 8. Dist Cx lesion, 90% stenosed. PCI Post intervention, there is a 0% residual stenosis.--   Denies SOB/CP.  PT recommends SNF.  Afebrile today.  CXR yesterday without PNA, urine culture negative.  Pending blood cultures.  Still with productive cough.  Sputum culture to be sent.  Creatinine stable at 1.7.    Objective:   Weight Range: 161 lb 3.2 oz (73.12 kg) Body mass index is 20.69 kg/(m^2).   Vital Signs:   Temp:  [98.6 F (37 C)-99.6 F (37.6 C)] 98.7 F (37.1 C) (07/15 1155) Resp:  [15-29] 27 (07/15 1100) BP: (75-113)/(54-72) 99/61 mmHg (07/15 1100) SpO2:  [93 %-97 %] 93 % (07/15 1155) Weight:  [161 lb 3.2 oz (73.12 kg)] 161 lb 3.2 oz (73.12 kg) (07/15 0407) Last BM Date:  (unknown PTA?)  Weight change: Filed Weights   11/03/15 0340 11/04/15 0402 11/05/15 0407  Weight: 165 lb 3.2 oz (74.934 kg) 157 lb 3.2 oz (71.305 kg) 161 lb 3.2 oz (73.12 kg)    Intake/Output:   Intake/Output Summary (Last 24 hours) at 11/05/15 1212 Last data filed at 11/05/15 1100  Gross per 24 hour  Intake    240  ml  Output    900 ml  Net   -660 ml     Physical Exam: General: NAD. In bed.  HEENT: Normal  Neck: supple. JVP 7. Carotids 2+ bilat; no bruits. No thyromegaly or nodule noted. Cor: PMI nondisplaced. Regular rate & rhythm. Regular rate and rhythm, 2/6 systolic murmur LLSB Lungs: Slight crackles at bases bilaterally.   Abdomen: soft, NT, ND, no HSM. No bruits or masses. +BS  Extremities: no cyanosis, clubbing, rash, edema. SCDs.   No DP pulse appreciated either side.  Neuro: alert & oriented x person, cranial nerves grossly intact. moves all 4 extremities w/o difficulty. Affect pleasant  Telemetry: Sinus Rhythm 70s.    Labs: CBC  Recent Labs  11/04/15 0208 11/05/15 0307  WBC 8.5 10.0  NEUTROABS  --  6.7  HGB 10.3* 10.1*  HCT 33.0* 32.4*  MCV 89.4 89.5  PLT 267 [id number]   Basic Metabolic Panel  Recent Labs  11/04/15 0208 11/05/15 0307  NA 139 140  K 3.6 3.6  CL 106 109  CO2 25 26  GLUCOSE 112* 54*  BUN 15 21*  CREATININE 1.78* 1.77*  CALCIUM 7.8* 7.9*  MG  --  1.5*   Liver Function Tests No results for input(s): AST, ALT, ALKPHOS, BILITOT, PROT, ALBUMIN in the last 72 hours. No results for input(s): LIPASE, AMYLASE in  the last 72 hours. Cardiac Enzymes No results for input(s): CKTOTAL, CKMB, CKMBINDEX, TROPONINI in the last 72 hours.  BNP: BNP (last 3 results)  Recent Labs  04/11/15 0400 07/26/15 1707 10/27/15 1124  BNP 2568.2* 2061.3* 1318.0*    ProBNP (last 3 results) No results for input(s): PROBNP in the last 8760 hours.   D-Dimer No results for input(s): DDIMER in the last 72 hours. Hemoglobin A1C No results for input(s): HGBA1C in the last 72 hours. Fasting Lipid Panel No results for input(s): CHOL, HDL, LDLCALC, TRIG, CHOLHDL, LDLDIRECT in the last 72 hours. Thyroid Function Tests No results for input(s): TSH, T4TOTAL, T3FREE, THYROIDAB in the last 72 hours.  Invalid input(s): FREET3  Other results:     Imaging/Studies:  Dg Chest  Port 1 View  11/25/2015  CLINICAL DATA:  Cough and fever EXAM: PORTABLE CHEST 1 VIEW COMPARISON:  10/28/2015 FINDINGS: Cardiac shadow remains enlarged. A defibrillator is again noted and stable. The endotracheal tube and nasogastric catheter have been removed in the interval. Minimal left basilar atelectasis is seen. No bony abnormality is noted. IMPRESSION: Minimal left basilar atelectasis. Electronically Signed   By: Inez Catalina M.D.   On: 11-25-15 09:40    Latest Echo  Latest Cath   Medications:     Scheduled Medications: . amiodarone  200 mg Oral BID  . antiseptic oral rinse  7 mL Mouth Rinse QID  . aspirin  81 mg Oral Daily  . carvedilol  3.125 mg Oral BID WC  . clopidogrel  75 mg Oral Daily  . digoxin  0.125 mg Oral Daily  . enoxaparin (LOVENOX) injection  40 mg Subcutaneous Daily  . insulin aspart  0-15 Units Subcutaneous Q4H  . insulin glargine  20 Units Subcutaneous Daily  . mirtazapine  15 mg Oral QHS  . rosuvastatin  40 mg Per Tube q1800  . sodium chloride  1,000 mL Intravenous Once  . spironolactone  12.5 mg Oral Daily  . tamsulosin  0.4 mg Oral Daily  . torsemide  20 mg Oral Daily   Infusions:    PRN Medications: acetaminophen, ondansetron (ZOFRAN) IV, RESOURCE THICKENUP CLEAR, sodium chloride flush   Assessment   1. VT 2. NSTEMI: Now s/p PCI to distal dominant LCx.  3. Chronic systolic HF: St Jude ICD.  4. CAD 5. ?CVA  6. Sepsis 7. Acute respiratory failure requiring intubation: Suspect aspiration pneumonitis/PNA.  8. UTI 9. Seizures  10. CKD: Baseline creatinine around 2.   Plan   He has had a large NSTEMI with VT.  Post LHC showed progressive lesion in distal LCx (dominant vessel).  Diffuse LAD disease and subtotal nondominant RCA occlusion similar to prior.  LAD territory nonviable on prior cMRI.  Had PCI  to distal LCx.    - Will continue Plavix/ASA/statin.   No further VT, now on po amiodarone.  Now on amiodarone 200 mg bid.  Echo  completed 7/8 with severely reduced EF 15-20%, grade II DD. Diffuse HK. This is similar to the past echo. CO-OX has been adequate. - Continue current Coreg, spironolactone, and digoxin.  Digoxin level ok, will repeat in am.  - Creatinine 1.4>1.5>1.7>1.7>1.7. Baseline creatinine seems to have been around 2 in the past.  Hold off on ACEI/ARB.  - Volume looks fine with torsemide 20 mg daily.    He completed a course of Unasyn for aspiration PNA and had Klebsiella UTI.  Still with productive cough.  CXR yesterday without PNA.  Urine culture yesterday negative.  Sputum  culture to be sent. Now afebrile and WBCs not significantly elevated.  PT recommends SNF.  We will see again on Monday unless there are clinical changes.   Length of Stay: [place]  11/05/2015, 12:12 PM  Advanced Heart Failure Team Pager [phone] (M-F; 7a - 4p)  Please contact [facility] for night-coverage after hours (4p -7a ) and weekends on [email or web address]

## 2015-11-05 NOTE — Progress Notes (Signed)
Hypoglycemic Event  CBG: 54  Treatment: 4oz orange juice  Symptoms: none  Follow-up CBG: Time:0430 CBG Result:67  Possible Reasons for Event: increase lantus  Comments/MD notified:protocol executed    [name] [name], [employer]

## 2015-11-05 NOTE — Progress Notes (Signed)
PROGRESS NOTE    [name] [name]  [id number] DOB: 04-23-1946 DOA: 10/27/2015 PCP: [name], MD   Brief Narrative:  70 yo WM PMHx CVA 2013, HTN,Chronic combined systolic and diastolic heart failure (EF 15%), Ischemic cardiomyopathy, CAD native artery S/P Automatic implantable cardioverter-defibrillator,NSVT, MI, OSA, DM Type 2  Who presents to [facility] ED with 24 hours of increased confusion, rt arm tremor, agitation. He vomited and most likely aspirated 7/6 and will require intubation for airway protection. Neuro and Cards will see patient, note he had VT in ER and IACD fired. We will admit to ICU, treat with abx,do nuero and cardiology work ups. He is a full code per wife and family   Assessment & Plan:   Active Problems:   Aspiration pneumonia (HCC)   Respiratory failure (HCC)   NSTEMI (non-ST elevated myocardial infarction) ([place])   Encounter for central line placement   Acute on chronic respiratory failure with hypoxia (HCC)   Acute on chronic diastolic congestive heart failure (HCC)   PAH (pulmonary artery hypertension) (HCC)   Aspiration pneumonia of right lower lobe due to vomit (HCC)   Systolic and diastolic CHF, chronic (HCC)   HLD (hyperlipidemia)   Acute encephalopathy   Acute renal failure superimposed on stage 3 chronic kidney disease ([place])   Uncontrolled type 2 diabetes mellitus with complication (HCC)   Systolic and diastolic CHF, acute on chronic (HCC)   Klebsiella infection   Aspiration pneumonia of right upper lobe due to vomit ([place])   Hyperglycemia   Type 2 diabetes mellitus with diabetic nephropathy, with long-term current use of insulin (HCC)   Type 2 diabetes mellitus with left diabetic foot ulcer (HCC)   Acute Hypoxic Respiratory Failure Secondary to aspiration pneumonia/Productive Cough -DuoNeb  QID -Mucinex DM -Flutter valve  Large NSTEMI -Peak Troponin > 65  -7/11 Left heart cath multivessel disease with stent placement see results  below  Ischemic Cardiomyopathy w/ Systolic and Diastolic CHF -7/8 echocardiogram EF remains 0000000, and diastolic CHF see results below -Strict in and out since admission + 1.8 L -Daily weight Filed Weights   11/03/15 0340 11/04/15 0402 11/05/15 0407  Weight: 74.934 kg (165 lb 3.2 oz) 71.305 kg (157 lb 3.2 oz) 73.12 kg (161 lb 3.2 oz)  -Stable for discharge per cardiology  Ventricular Tachycardia - IACD Fired in ED 7/6 -Amiodarone 200 mg  BID -Coreg 3.125 mg BID -Digoxin 0.125 mg daily  -Spironolactone 12.5 mg daily -Demadex 20 mg daily  Hyperlipidemia -Crestor 40 mg daily   Probable Partial Seizure -Not on seizure medication    Acute Encephalopathy -Resolved:   Possible New Acute CVA - H/O CVA 2015 - Mild right-sided weakness & slurred speech -Resolved   Acute on CKD III( baseline Cr 1.7-2.2) Lab Results  Component Value Date   CREATININE 1.89* 11/05/2015   CREATININE 1.77* 11/05/2015   CREATININE 1.78* 11/04/2015  -Within baseline  Nutrition - Probable Aspiration -Speech Eval has cleared for D3 with thin liquid diet  Anemia Mild - no signs of active bleeding  Positive Klebsiella UTI -Completed 5 days of antibiotics    DM Type 2 uncontrolled with, complication 7/6 Hemoglobin A1c 12.0  -Increase Lantus 20 units daily -Moderate SSI  Left foot diabetic ulcer -Left plantar surface ulcer lateral aspect. Santyl ointment BID   DVT prophylaxis: Lovenox Code Status: Full Family Communication: Wife present Disposition Plan: Cardiology has cleared awaiting SNF clearance   Consultants:  [place] [facility]   Procedures/Significant Events:  7/6 - admitted -  V tach in the ED, AICD fired - intubated 7/8 - extubated 7/8 echocardiogram:- Left ventricle: severely dilated.-LVEF= 15% to 20%. Severe diffuse hypokinesis. Akinesis of the apical myocardium. - (grade 2 diastolic dysfunction). - Left atrium: moderately dilated.- Pulmonary arteries: PA peak  pressure: 57 mm Hg (S). 7/10 - TRH assumed care 7/11 left heart cath:-LAD: 1. Prox LAD to Mid LAD lesion, 90% stenosed. 2. Lat 1st Diag lesion, 90% stenosed. 3. Ost 3rd Mrg to 3rd Mrg lesion, 95% stenosed. 4. Ost 2nd Mrg to 2nd Mrg lesion, 60% stenosed. 5. Ost Ramus to Ramus lesion, 90% stenosed. 6. Ost 4th Mrg to 4th Mrg lesion, 70% stenosed. 7. Mid RCA lesion, 100% stenosed. 8. Dist Cx lesion, 90% stenosed. S/P placement Stent Promus Prem Mr [id number] [phone] there is a 0% residual stenosis   Cultures 7/6 urine positive Klebsiella OXYTOCA 7/6 blood left wrist/hand negative 7/7 Tracheal Aspirate positive rare Candida albicans 7/15 respiratory pending   Antimicrobials: Unasyn 7/6>> 7/11   Devices NA   LINES / TUBES:  Left IJ CVL     Continuous Infusions:     Subjective: 7/15 A/O 4, afebrile last 24 hours , positive productive cough yellow, negative leukocytosis. Patient ambulated negative SOB, negative CP.     Objective: Filed Vitals:   11/05/15 1100 11/05/15 1155 11/05/15 1620 11/05/15 1630  BP: 99/61  100/61   Pulse:      Temp:  98.7 F (37.1 C)  98.6 F (37 C)  TempSrc:  Oral  Oral  Resp: 27     Height:      Weight:      SpO2:  93% 84%     Intake/Output Summary (Last 24 hours) at 11/05/15 1712 Last data filed at 11/05/15 1200  Gross per 24 hour  Intake    240 ml  Output   1000 ml  Net   -760 ml   Filed Weights   11/03/15 0340 11/04/15 0402 11/05/15 0407  Weight: 74.934 kg (165 lb 3.2 oz) 71.305 kg (157 lb 3.2 oz) 73.12 kg (161 lb 3.2 oz)    Examination:  General:A/O 4, NAD.,Sitting in Bed comfortably, No acute respiratory distress Eyes: negative scleral hemorrhage, negative anisocoria, negative icterus ENT: Negative Runny nose, negative gingival bleeding, Neck:  Negative scars, masses, torticollis, lymphadenopathy, JVD, Lungs: distant breath sounds, poor air movement by basilar, positive mild wheezes, negative crackles Cardiovascular:  Regular rate and rhythm positive systolic murmur , negative gallop or rub normal S1 and S2 Abdomen: negative abdominal pain, nondistended, positive soft, bowel sounds, no rebound, no ascites, no appreciable mass Extremities: No significant cyanosis, clubbing, or edema bilateral lower extremities. Left foot plantar surface diabetic ulcer, mild serosanguineous discharge on pad with mild tenderness to palpation. Skin: Negative rashes, lesions, positive left foot plantar ulcer Psychiatric:  Negative depression, negative anxiety, negative fatigue, negative mania  Central nervous system:  Cranial nerves II through XII intact, tongue/uvula midline, all extremities muscle strength 5/5, sensation intact throughout,  negative dysarthria, negative expressive aphasia, negative receptive aphasia.  .     Data Reviewed: Care during the described time interval was provided by me .  I have reviewed this patient's available data, including medical history, events of note, physical examination, and all test results as part of my evaluation. I have personally reviewed and interpreted all radiology studies.  CBC:  Recent Labs Lab 11/02/15 0345 11/03/15 0330 11/04/15 0208 11/05/15 0307 11/05/15 1512  WBC 8.2 11.3* 8.5 10.0 7.9  NEUTROABS  --   --   --  6.7 5.7  HGB 9.6* 10.5* 10.3* 10.1* 10.2*  HCT 30.9* 33.3* 33.0* 32.4* 32.9*  MCV 89.3 89.0 89.4 89.5 90.1  PLT 174 242 267 [id number]   Basic Metabolic Panel:  Recent Labs Lab 11/01/15 0410 11/02/15 0345 11/03/15 0330 11/04/15 0208 11/05/15 0307 11/05/15 1512  NA 141 141 140 139 140 140  K 3.9 4.0 3.9 3.6 3.6 3.7  CL 114* 114* 109 106 109 105  CO2 22 22 23 25 26 29   GLUCOSE 150* 172* 227* 112* 54* 181*  BUN 19 16 18 15  21* 20  CREATININE 1.46* 1.55* 1.71* 1.78* 1.77* 1.89*  CALCIUM 8.0* 7.9* 7.9* 7.8* 7.9* 7.9*  MG 1.8  --   --   --  1.5* 1.6*   GFR: Estimated Creatinine Clearance: 38.1 mL/min (by C-G formula based on Cr of 1.89). Liver  Function Tests:  Recent Labs Lab 11/01/15 0410  AST 21  ALT 15*  ALKPHOS 140*  BILITOT 1.0  PROT 5.4*  ALBUMIN 2.1*   No results for input(s): LIPASE, AMYLASE in the last 168 hours. No results for input(s): AMMONIA in the last 168 hours. Coagulation Profile: No results for input(s): INR, PROTIME in the last 168 hours. Cardiac Enzymes: No results for input(s): CKTOTAL, CKMB, CKMBINDEX, TROPONINI in the last 168 hours. BNP (last 3 results) No results for input(s): PROBNP in the last 8760 hours. HbA1C: No results for input(s): HGBA1C in the last 72 hours. CBG:  Recent Labs Lab 11/05/15 0431 11/05/15 0447 11/05/15 0509 11/05/15 0755 11/05/15 1157  GLUCAP 67 66 116* 123* 238*   Lipid Profile: No results for input(s): CHOL, HDL, LDLCALC, TRIG, CHOLHDL, LDLDIRECT in the last 72 hours. Thyroid Function Tests: No results for input(s): TSH, T4TOTAL, FREET4, T3FREE, THYROIDAB in the last 72 hours. Anemia Panel: No results for input(s): VITAMINB12, FOLATE, FERRITIN, TIBC, IRON, RETICCTPCT in the last 72 hours. Urine analysis:    Component Value Date/Time   COLORURINE YELLOW 10/27/2015 0900   APPEARANCEUR CLOUDY* 10/27/2015 0900   LABSPEC 1.028 10/27/2015 0900   PHURINE 5.0 10/27/2015 0900   GLUCOSEU >1000* 10/27/2015 0900   HGBUR SMALL* 10/27/2015 0900   BILIRUBINUR NEGATIVE 10/27/2015 0900   BILIRUBINUR n 12/25/2011 1325   KETONESUR NEGATIVE 10/27/2015 0900   PROTEINUR NEGATIVE 10/27/2015 0900   PROTEINUR 3+ 12/25/2011 1325   UROBILINOGEN 0.2 12/25/2011 1325   UROBILINOGEN 0.2 09/06/2011 0908   NITRITE POSITIVE* 10/27/2015 0900   NITRITE n 12/25/2011 1325   LEUKOCYTESUR SMALL* 10/27/2015 0900   Sepsis Labs: @LABRCNTIP (procalcitonin:4,lacticidven:4)  ) Recent Results (from the past 240 hour(s))  Blood culture (routine x 2)     Status: None   Collection Time: 10/27/15  8:57 AM  Result Value Ref Range Status   Specimen Description BLOOD LEFT FOREARM  Final    Special Requests BOTTLES DRAWN AEROBIC ONLY  8CC  Final   Culture NO GROWTH 5 DAYS  Final   Report Status 11/01/2015 FINAL  Final  Blood culture (routine x 2)     Status: None   Collection Time: 10/27/15  8:59 AM  Result Value Ref Range Status   Specimen Description BLOOD RIGHT ANTECUBITAL  Final   Special Requests BOTTLES DRAWN AEROBIC AND ANAEROBIC  5CC  Final   Culture NO GROWTH 5 DAYS  Final   Report Status 11/01/2015 FINAL  Final  Urine culture     Status: Abnormal   Collection Time: 10/27/15  9:00 AM  Result Value Ref Range Status   Specimen Description URINE,  CATHETERIZED  Final   Special Requests Normal  Final   Culture >=100,000 COLONIES/mL KLEBSIELLA OXYTOCA (A)  Final   Report Status 10/29/2015 FINAL  Final   Organism ID, Bacteria KLEBSIELLA OXYTOCA (A)  Final      Susceptibility   Klebsiella oxytoca - MIC*    AMPICILLIN 16 RESISTANT Resistant     CEFAZOLIN <=4 SENSITIVE Sensitive     CEFTRIAXONE <=1 SENSITIVE Sensitive     CIPROFLOXACIN <=0.25 SENSITIVE Sensitive     GENTAMICIN <=1 SENSITIVE Sensitive     IMIPENEM <=0.25 SENSITIVE Sensitive     NITROFURANTOIN <=16 SENSITIVE Sensitive     TRIMETH/SULFA <=20 SENSITIVE Sensitive     AMPICILLIN/SULBACTAM 4 SENSITIVE Sensitive     PIP/TAZO <=4 SENSITIVE Sensitive     * >=100,000 COLONIES/mL KLEBSIELLA OXYTOCA  MRSA PCR Screening     Status: None   Collection Time: 10/27/15 11:28 AM  Result Value Ref Range Status   MRSA by PCR NEGATIVE NEGATIVE Final    Comment:        The GeneXpert MRSA Assay (FDA approved for NASAL specimens only), is one component of a comprehensive MRSA colonization surveillance program. It is not intended to diagnose MRSA infection nor to guide or monitor treatment for MRSA infections.   Culture, blood (routine x 2)     Status: None   Collection Time: 10/27/15 11:39 AM  Result Value Ref Range Status   Specimen Description BLOOD LEFT WRIST  Final   Special Requests BOTTLES DRAWN AEROBIC  ONLY 5CC  Final   Culture NO GROWTH 5 DAYS  Final   Report Status 11/01/2015 FINAL  Final  Culture, blood (routine x 2)     Status: None   Collection Time: 10/27/15 11:44 AM  Result Value Ref Range Status   Specimen Description BLOOD LEFT HAND  Final   Special Requests IN PEDIATRIC BOTTLE 1CC  Final   Culture NO GROWTH 5 DAYS  Final   Report Status 11/01/2015 FINAL  Final  Culture, respiratory (NON-Expectorated)     Status: None   Collection Time: 10/28/15  1:05 PM  Result Value Ref Range Status   Specimen Description TRACHEAL ASPIRATE  Final   Special Requests Normal  Final   Gram Stain   Final    ABUNDANT WBC PRESENT, PREDOMINANTLY PMN NO SQUAMOUS EPITHELIAL CELLS SEEN NO ORGANISMS SEEN    Culture RARE CANDIDA ALBICANS  Final   Report Status 10/31/2015 FINAL  Final  Culture, Urine     Status: None   Collection Time: 11/04/15 10:37 AM  Result Value Ref Range Status   Specimen Description URINE, CLEAN CATCH  Final   Special Requests NONE  Final   Culture NO GROWTH  Final   Report Status 11/05/2015 FINAL  Final  Culture, expectorated sputum-assessment     Status: None   Collection Time: 11/05/15 12:28 PM  Result Value Ref Range Status   Specimen Description SPUTUM  Final   Special Requests Immunocompromised  Final   Sputum evaluation   Final    THIS SPECIMEN IS ACCEPTABLE. RESPIRATORY CULTURE REPORT TO FOLLOW.   Report Status 11/05/2015 FINAL  Final  Culture, respiratory (NON-Expectorated)     Status: None (Preliminary result)   Collection Time: 11/05/15 12:28 PM  Result Value Ref Range Status   Specimen Description SPUTUM  Final   Special Requests NONE  Final   Gram Stain   Final    MODERATE WBC PRESENT, PREDOMINANTLY PMN ABUNDANT GRAM POSITIVE COCCI IN CLUSTERS IN PAIRS  MODERATE GRAM NEGATIVE RODS    Culture PENDING  Incomplete   Report Status PENDING  Incomplete         Radiology Studies: Dg Chest Port 1 View  11/04/2015  CLINICAL DATA:  Cough and fever  EXAM: PORTABLE CHEST 1 VIEW COMPARISON:  10/28/2015 FINDINGS: Cardiac shadow remains enlarged. A defibrillator is again noted and stable. The endotracheal tube and nasogastric catheter have been removed in the interval. Minimal left basilar atelectasis is seen. No bony abnormality is noted. IMPRESSION: Minimal left basilar atelectasis. Electronically Signed   By: Inez Catalina M.D.   On: 11/04/2015 09:40        Scheduled Meds: . amiodarone  200 mg Oral BID  . aspirin  81 mg Oral Daily  . carvedilol  3.125 mg Oral BID WC  . clopidogrel  75 mg Oral Daily  . collagenase   Topical BID  . dextromethorphan-guaiFENesin  1 tablet Oral BID  . digoxin  0.125 mg Oral Daily  . enoxaparin (LOVENOX) injection  40 mg Subcutaneous Daily  . insulin aspart  0-15 Units Subcutaneous Q4H  . insulin glargine  20 Units Subcutaneous Daily  . ipratropium-albuterol  3 mL Nebulization Q6H  . mirtazapine  15 mg Oral QHS  . rosuvastatin  40 mg Per Tube q1800  . sodium chloride  1,000 mL Intravenous Once  . spironolactone  12.5 mg Oral Daily  . tamsulosin  0.4 mg Oral Daily  . torsemide  20 mg Oral Daily   Continuous Infusions:     LOS: 9 days    Time spent: 40 minutes    WOODS, Geraldo Docker, MD [facility] Pager [phone]   If 7PM-7AM, please contact night-coverage [email or web address] Password [email or web address] 11/05/2015, 5:12 PM

## 2015-11-05 NOTE — Progress Notes (Signed)
Hypoglycemic Event  CBG: 67  Treatment: 4oz orange juice  Symptoms: none  Follow-up CBG: [id number] CBG Result:66  Possible Reasons for Event: same  Comments/MD notified:Protocol    Amo Kuffour, Trilby Drummer

## 2015-11-05 NOTE — Progress Notes (Signed)
Hypoglycemic Event  CBG: 66  Treatment: 1ml d50  Symptoms: none  Follow-up CBG: Time:0506 CBG Result116  Possible Reasons for Event:   Comments/MD notified:protocol    [name] [name], [name] [name]

## 2015-11-06 DIAGNOSIS — E1140 Type 2 diabetes mellitus with diabetic neuropathy, unspecified: Secondary | ICD-10-CM | POA: Diagnosis not present

## 2015-11-06 DIAGNOSIS — R058 Other specified cough: Secondary | ICD-10-CM | POA: Diagnosis present

## 2015-11-06 DIAGNOSIS — L97522 Non-pressure chronic ulcer of other part of left foot with fat layer exposed: Secondary | ICD-10-CM | POA: Diagnosis not present

## 2015-11-06 DIAGNOSIS — R278 Other lack of coordination: Secondary | ICD-10-CM | POA: Diagnosis not present

## 2015-11-06 DIAGNOSIS — I130 Hypertensive heart and chronic kidney disease with heart failure and stage 1 through stage 4 chronic kidney disease, or unspecified chronic kidney disease: Secondary | ICD-10-CM | POA: Diagnosis not present

## 2015-11-06 DIAGNOSIS — G8191 Hemiplegia, unspecified affecting right dominant side: Secondary | ICD-10-CM | POA: Diagnosis not present

## 2015-11-06 DIAGNOSIS — Z825 Family history of asthma and other chronic lower respiratory diseases: Secondary | ICD-10-CM | POA: Diagnosis not present

## 2015-11-06 DIAGNOSIS — I5040 Unspecified combined systolic (congestive) and diastolic (congestive) heart failure: Secondary | ICD-10-CM | POA: Diagnosis not present

## 2015-11-06 DIAGNOSIS — B961 Klebsiella pneumoniae [K. pneumoniae] as the cause of diseases classified elsewhere: Secondary | ICD-10-CM | POA: Diagnosis not present

## 2015-11-06 DIAGNOSIS — J9690 Respiratory failure, unspecified, unspecified whether with hypoxia or hypercapnia: Secondary | ICD-10-CM | POA: Diagnosis not present

## 2015-11-06 DIAGNOSIS — I7789 Other specified disorders of arteries and arterioles: Secondary | ICD-10-CM | POA: Diagnosis not present

## 2015-11-06 DIAGNOSIS — Z7982 Long term (current) use of aspirin: Secondary | ICD-10-CM | POA: Diagnosis not present

## 2015-11-06 DIAGNOSIS — N320 Bladder-neck obstruction: Secondary | ICD-10-CM | POA: Diagnosis not present

## 2015-11-06 DIAGNOSIS — G9340 Encephalopathy, unspecified: Secondary | ICD-10-CM | POA: Diagnosis not present

## 2015-11-06 DIAGNOSIS — E785 Hyperlipidemia, unspecified: Secondary | ICD-10-CM | POA: Diagnosis not present

## 2015-11-06 DIAGNOSIS — R05 Cough: Secondary | ICD-10-CM | POA: Diagnosis present

## 2015-11-06 DIAGNOSIS — E1122 Type 2 diabetes mellitus with diabetic chronic kidney disease: Secondary | ICD-10-CM | POA: Diagnosis not present

## 2015-11-06 DIAGNOSIS — L97529 Non-pressure chronic ulcer of other part of left foot with unspecified severity: Secondary | ICD-10-CM

## 2015-11-06 DIAGNOSIS — I5022 Chronic systolic (congestive) heart failure: Secondary | ICD-10-CM | POA: Diagnosis not present

## 2015-11-06 DIAGNOSIS — J690 Pneumonitis due to inhalation of food and vomit: Secondary | ICD-10-CM | POA: Diagnosis not present

## 2015-11-06 DIAGNOSIS — Z794 Long term (current) use of insulin: Secondary | ICD-10-CM | POA: Diagnosis not present

## 2015-11-06 DIAGNOSIS — N179 Acute kidney failure, unspecified: Secondary | ICD-10-CM | POA: Diagnosis present

## 2015-11-06 DIAGNOSIS — I2510 Atherosclerotic heart disease of native coronary artery without angina pectoris: Secondary | ICD-10-CM | POA: Diagnosis not present

## 2015-11-06 DIAGNOSIS — M21372 Foot drop, left foot: Secondary | ICD-10-CM | POA: Diagnosis not present

## 2015-11-06 DIAGNOSIS — R29810 Facial weakness: Secondary | ICD-10-CM | POA: Diagnosis not present

## 2015-11-06 DIAGNOSIS — N183 Chronic kidney disease, stage 3 (moderate): Secondary | ICD-10-CM | POA: Diagnosis not present

## 2015-11-06 DIAGNOSIS — E11621 Type 2 diabetes mellitus with foot ulcer: Secondary | ICD-10-CM | POA: Diagnosis present

## 2015-11-06 DIAGNOSIS — I255 Ischemic cardiomyopathy: Secondary | ICD-10-CM | POA: Diagnosis not present

## 2015-11-06 DIAGNOSIS — J189 Pneumonia, unspecified organism: Secondary | ICD-10-CM | POA: Diagnosis not present

## 2015-11-06 DIAGNOSIS — L97821 Non-pressure chronic ulcer of other part of left lower leg limited to breakdown of skin: Secondary | ICD-10-CM | POA: Diagnosis not present

## 2015-11-06 DIAGNOSIS — E1142 Type 2 diabetes mellitus with diabetic polyneuropathy: Secondary | ICD-10-CM | POA: Diagnosis not present

## 2015-11-06 DIAGNOSIS — E1165 Type 2 diabetes mellitus with hyperglycemia: Secondary | ICD-10-CM | POA: Diagnosis not present

## 2015-11-06 DIAGNOSIS — N390 Urinary tract infection, site not specified: Secondary | ICD-10-CM | POA: Diagnosis not present

## 2015-11-06 DIAGNOSIS — J9600 Acute respiratory failure, unspecified whether with hypoxia or hypercapnia: Secondary | ICD-10-CM | POA: Diagnosis not present

## 2015-11-06 DIAGNOSIS — I5031 Acute diastolic (congestive) heart failure: Secondary | ICD-10-CM | POA: Diagnosis not present

## 2015-11-06 DIAGNOSIS — R41841 Cognitive communication deficit: Secondary | ICD-10-CM | POA: Diagnosis not present

## 2015-11-06 DIAGNOSIS — R4181 Age-related cognitive decline: Secondary | ICD-10-CM | POA: Diagnosis not present

## 2015-11-06 DIAGNOSIS — I214 Non-ST elevation (NSTEMI) myocardial infarction: Secondary | ICD-10-CM | POA: Diagnosis not present

## 2015-11-06 DIAGNOSIS — M86672 Other chronic osteomyelitis, left ankle and foot: Secondary | ICD-10-CM | POA: Diagnosis not present

## 2015-11-06 DIAGNOSIS — R258 Other abnormal involuntary movements: Secondary | ICD-10-CM | POA: Diagnosis not present

## 2015-11-06 DIAGNOSIS — Z8673 Personal history of transient ischemic attack (TIA), and cerebral infarction without residual deficits: Secondary | ICD-10-CM | POA: Diagnosis not present

## 2015-11-06 DIAGNOSIS — I5043 Acute on chronic combined systolic (congestive) and diastolic (congestive) heart failure: Secondary | ICD-10-CM | POA: Diagnosis not present

## 2015-11-06 DIAGNOSIS — N189 Chronic kidney disease, unspecified: Secondary | ICD-10-CM | POA: Diagnosis present

## 2015-11-06 DIAGNOSIS — R739 Hyperglycemia, unspecified: Secondary | ICD-10-CM | POA: Diagnosis not present

## 2015-11-06 DIAGNOSIS — I472 Ventricular tachycardia: Secondary | ICD-10-CM | POA: Diagnosis not present

## 2015-11-06 DIAGNOSIS — G4730 Sleep apnea, unspecified: Secondary | ICD-10-CM | POA: Diagnosis not present

## 2015-11-06 DIAGNOSIS — M7732 Calcaneal spur, left foot: Secondary | ICD-10-CM | POA: Diagnosis not present

## 2015-11-06 DIAGNOSIS — I5042 Chronic combined systolic (congestive) and diastolic (congestive) heart failure: Secondary | ICD-10-CM | POA: Diagnosis not present

## 2015-11-06 DIAGNOSIS — Z7902 Long term (current) use of antithrombotics/antiplatelets: Secondary | ICD-10-CM | POA: Diagnosis not present

## 2015-11-06 DIAGNOSIS — I509 Heart failure, unspecified: Secondary | ICD-10-CM | POA: Diagnosis not present

## 2015-11-06 DIAGNOSIS — J9601 Acute respiratory failure with hypoxia: Secondary | ICD-10-CM | POA: Diagnosis not present

## 2015-11-06 DIAGNOSIS — I25119 Atherosclerotic heart disease of native coronary artery with unspecified angina pectoris: Secondary | ICD-10-CM | POA: Diagnosis not present

## 2015-11-06 DIAGNOSIS — Z95810 Presence of automatic (implantable) cardiac defibrillator: Secondary | ICD-10-CM | POA: Diagnosis not present

## 2015-11-06 DIAGNOSIS — M6289 Other specified disorders of muscle: Secondary | ICD-10-CM | POA: Diagnosis not present

## 2015-11-06 DIAGNOSIS — D649 Anemia, unspecified: Secondary | ICD-10-CM | POA: Diagnosis not present

## 2015-11-06 DIAGNOSIS — Z79899 Other long term (current) drug therapy: Secondary | ICD-10-CM | POA: Diagnosis not present

## 2015-11-06 DIAGNOSIS — R972 Elevated prostate specific antigen [PSA]: Secondary | ICD-10-CM | POA: Diagnosis not present

## 2015-11-06 DIAGNOSIS — M6281 Muscle weakness (generalized): Secondary | ICD-10-CM | POA: Diagnosis not present

## 2015-11-06 DIAGNOSIS — I252 Old myocardial infarction: Secondary | ICD-10-CM | POA: Diagnosis not present

## 2015-11-06 DIAGNOSIS — I639 Cerebral infarction, unspecified: Secondary | ICD-10-CM | POA: Diagnosis not present

## 2015-11-06 LAB — BASIC METABOLIC PANEL
Anion gap: 6 (ref 5–15)
BUN: 21 mg/dL — AB (ref 6–20)
CHLORIDE: 108 mmol/L (ref 101–111)
CO2: 25 mmol/L (ref 22–32)
CREATININE: 1.65 mg/dL — AB (ref 0.61–1.24)
Calcium: 7.8 mg/dL — ABNORMAL LOW (ref 8.9–10.3)
GFR, EST AFRICAN AMERICAN: 47 mL/min — AB (ref >60)
GFR, EST NON AFRICAN AMERICAN: 41 mL/min — AB (ref >60)
Glucose, Bld: 100 mg/dL — ABNORMAL HIGH (ref 65–99)
POTASSIUM: 3.9 mmol/L (ref 3.5–5.1)
SODIUM: 139 mmol/L (ref 135–145)

## 2015-11-06 LAB — CBC WITH DIFFERENTIAL/PLATELET
BASOS ABS: 0 10*3/uL (ref 0.0–0.1)
BASOS PCT: 0 %
EOS PCT: 4 %
Eosinophils Absolute: 0.3 10*3/uL (ref 0.0–0.7)
HCT: 32 % — ABNORMAL LOW (ref 39.0–52.0)
Hemoglobin: 10.1 g/dL — ABNORMAL LOW (ref 13.0–17.0)
LYMPHS PCT: 14 %
Lymphs Abs: 1.3 10*3/uL (ref 0.7–4.0)
MCH: 28.4 pg (ref 26.0–34.0)
MCHC: 31.6 g/dL (ref 30.0–36.0)
MCV: 89.9 fL (ref 78.0–100.0)
MONO ABS: 0.9 10*3/uL (ref 0.1–1.0)
Monocytes Relative: 10 %
NEUTROS ABS: 6.4 10*3/uL (ref 1.7–7.7)
Neutrophils Relative %: 72 %
PLATELETS: 299 10*3/uL (ref 150–400)
RBC: 3.56 MIL/uL — AB (ref 4.22–5.81)
RDW: 15.1 % (ref 11.5–15.5)
WBC: 8.8 10*3/uL (ref 4.0–10.5)

## 2015-11-06 LAB — GLUCOSE, CAPILLARY
GLUCOSE-CAPILLARY: 88 mg/dL (ref 65–99)
GLUCOSE-CAPILLARY: 243 mg/dL — AB (ref 65–99)
Glucose-Capillary: 180 mg/dL — ABNORMAL HIGH (ref 65–99)
Glucose-Capillary: 93 mg/dL (ref 65–99)

## 2015-11-06 LAB — DIGOXIN LEVEL: DIGOXIN LVL: 0.7 ng/mL — AB (ref 0.8–2.0)

## 2015-11-06 LAB — MAGNESIUM: MAGNESIUM: 1.5 mg/dL — AB (ref 1.7–2.4)

## 2015-11-06 MED ORDER — DM-GUAIFENESIN ER 30-600 MG PO TB12: 1.0000 | ORAL_TABLET | Freq: Two times a day (BID) | ORAL | Status: DC

## 2015-11-06 MED ORDER — COLLAGENASE 250 UNIT/GM EX OINT: TOPICAL_OINTMENT | Freq: Two times a day (BID) | CUTANEOUS | Status: DC

## 2015-11-06 MED ORDER — AMIODARONE HCL 200 MG PO TABS: 200.0000 mg | ORAL_TABLET | Freq: Two times a day (BID) | ORAL | Status: DC

## 2015-11-06 MED ORDER — TORSEMIDE 20 MG PO TABS: 20.0000 mg | ORAL_TABLET | Freq: Every day | ORAL | Status: DC

## 2015-11-06 MED ORDER — IPRATROPIUM-ALBUTEROL 0.5-2.5 (3) MG/3ML IN SOLN: 3.0000 mL | Freq: Four times a day (QID) | RESPIRATORY_TRACT | Status: DC | PRN

## 2015-11-06 MED ORDER — CARVEDILOL 3.125 MG PO TABS
3.1250 mg | ORAL_TABLET | Freq: Two times a day (BID) | ORAL | Status: DC
Start: 2015-11-06 — End: 2015-12-06

## 2015-11-06 MED ORDER — MAGNESIUM OXIDE 400 (241.3 MG) MG PO TABS
600.0000 mg | ORAL_TABLET | Freq: Once | ORAL | Status: AC
  Administered 2015-11-06: 600 mg via ORAL
  Filled 2015-11-06: qty 2

## 2015-11-06 MED ORDER — CLOPIDOGREL BISULFATE 75 MG PO TABS: 75.0000 mg | ORAL_TABLET | Freq: Every day | ORAL | Status: DC

## 2015-11-06 NOTE — Discharge Summary (Addendum)
Physician Discharge Summary  [name] [name] [name] [id number] DOB: 1946/02/08 DOA: 10/27/2015  PCP: [name], MD  Admit date: 10/27/2015 Discharge date: 11/06/2015  Time spent:40 minutes  Recommendations for Outpatient Follow-up:  Acute Hypoxic Respiratory Failure Secondary to aspiration pneumonia/Productive Cough -Per patient resolved but feels better with breathing treatments. -DuoNeb QID -Mucinex DM -Flutter valve -Schedule follow-up with Dr. [name] aspiration pneumonia, uncontrolled diabetes type 2, HLD, NSTEMI  Large NSTEMI -Peak Troponin > 65  -7/11 Left heart cath multivessel disease with stent placement see results below  Ischemic Cardiomyopathy w/ Systolic and Diastolic CHF -7/8 echocardiogram EF remains 0000000, and diastolic CHF see results below -Strict in and out since admission + 1.9 L -Daily weight Filed Weights   11/04/15 0402 11/05/15 0407 11/06/15 0325  Weight: 71.305 kg (157 lb 3.2 oz) 73.12 kg (161 lb 3.2 oz) 72.258 kg (159 lb 4.8 oz)  -Schedule follow-up with Dr. Loralie Champagne cardiology in 1-2 weeks NSTEMI  Ventricular Tachycardia - IACD Fired in ED 7/6 -Amiodarone 200 mg BID -Coreg 3.125 mg BID -Digoxin 0.125 mg daily  -Spironolactone 12.5 mg daily -Demadex 20 mg daily  Hyperlipidemia -Crestor 40 mg daily   Probable Partial Seizure -Not on seizure medication   Acute Encephalopathy -Resolved:   Possible New Acute CVA - H/O CVA 2015 - Mild right-sided weakness & slurred speech -Resolved   Acute on CKD III( baseline Cr 1.7-2.2) Lab Results  Component Value Date   CREATININE 1.65* 11/06/2015   CREATININE 1.89* 11/05/2015   CREATININE 1.77* 11/05/2015  -Within baseline at discharge  Nutrition - Probable Aspiration -Continue dysphagia 3 diet with thin liquid  Anemia Mild - no signs of active bleeding  Positive Klebsiella UTI -Completed 5 days of antibiotics   DM Type 2 uncontrolled with, complication 7/6 Hemoglobin A1c  12.0  -Lantus 20 units daily -Restart home medication NovoLog 5-15 units QAC  Left foot diabetic ulcer -Left plantar surface ulcer lateral aspect. Santyl ointment BID -Schedule Follow-up 1-2 weeks with wound care center for diabetic foot ulcer    Discharge Diagnoses:  Active Problems:   Aspiration pneumonia (HCC)   Respiratory failure (HCC)   NSTEMI (non-ST elevated myocardial infarction) ([place])   Encounter for central line placement   Acute on chronic respiratory failure with hypoxia (HCC)   Acute on chronic diastolic congestive heart failure (HCC)   PAH (pulmonary artery hypertension) (HCC)   Aspiration pneumonia of right lower lobe due to vomit (HCC)   Systolic and diastolic CHF, chronic (HCC)   HLD (hyperlipidemia)   Acute encephalopathy   Acute renal failure superimposed on stage 3 chronic kidney disease ([place])   Uncontrolled type 2 diabetes mellitus with complication (HCC)   Systolic and diastolic CHF, acute on chronic (HCC)   Klebsiella infection   Aspiration pneumonia of right upper lobe due to vomit ([name])   Hyperglycemia   Type 2 diabetes mellitus with diabetic nephropathy, with long-term current use of insulin ([name])   Type 2 diabetes mellitus with left diabetic foot ulcer (HCC)   Productive cough   Acute on chronic kidney failure ([place])   Diabetic ulcer of left foot associated with type 2 diabetes mellitus ([name])   Discharge Condition: Stable  Diet recommendation: Heart healthy/American Diabetic Association  Filed Weights   11/04/15 0402 11/05/15 0407 11/06/15 0325  Weight: 71.305 kg (157 lb 3.2 oz) 73.12 kg (161 lb 3.2 oz) 72.258 kg (159 lb 4.8 oz)    History of present illness:  70 yo [name] [name] [name], HTN,Chronic  combined systolic and diastolic heart failure (EF 15%), Ischemic cardiomyopathy, CAD native artery S/P Automatic implantable cardioverter-defibrillator,NSVT, MI, OSA, DM Type 2  Who presents to [facility] ED with 24 hours of increased confusion, rt arm  tremor, agitation. He vomited and most likely aspirated 7/6 and will require intubation for airway protection. Neuro and Cards will see patient, note he had VT in ER and IACD fired. We will admit to ICU, treat with abx,do nuero and cardiology work ups. He is a full code per wife and family During his hospital physician patient was found to have sustained a large NSTEMI. S/P left heart catheterization which showed multivessel stenosis, one stent was placed. Echocardiogram following NSTEMI showed significant LV dilation with EF of 15-20%. Secondary to NSTEMI patient suffered aspiration pneumonia which was treated with appropriate course of antibiotics and has resolved. Patient and family aware will require extensive cardiac rehabilitation.    Procedures: 7/6 - admitted - V tach in the ED, AICD fired - intubated 7/8 - extubated 7/8 echocardiogram:- Left ventricle: severely dilated.-LVEF= 15% to 20%. Severe diffuse hypokinesis. Akinesis of the apical myocardium. - (grade 2 diastolic dysfunction). - Left atrium: moderately dilated.- Pulmonary arteries: PA peak pressure: 57 mm Hg (S). 7/10 - TRH assumed care 7/11 left heart cath:-LAD: 1. Prox LAD to Mid LAD lesion, 90% stenosed. 2. Lat 1st Diag lesion, 90% stenosed. 3. Ost 3rd Mrg to 3rd Mrg lesion, 95% stenosed. 4. Ost 2nd Mrg to 2nd Mrg lesion, 60% stenosed. 5. Ost Ramus to Ramus lesion, 90% stenosed. 6. Ost 4th Mrg to 4th Mrg lesion, 70% stenosed. 7. Mid RCA lesion, 100% stenosed. 8. Dist Cx lesion, 90% stenosed. S/P placement Stent Promus Prem Mr [id number] [phone] there is a 0% residual stenosis   Consultations: [facility]   Cultures 7/6 urine positive Klebsiella OXYTOCA 7/6 blood left wrist/hand negative 7/7 Tracheal Aspirate positive rare Candida albicans 7/15 respiratory pending   Antibiotics Unasyn 7/6>> 7/11    Discharge Exam: Filed Vitals:   11/06/15 0132 11/06/15 0325 11/06/15 0734 11/06/15 0836  BP:   109/68 107/67   Pulse:      Temp:  99.1 F (37.3 C) 98.9 F (37.2 C)   TempSrc:  Oral Oral   Resp:  21 18   Height:      Weight:  72.258 kg (159 lb 4.8 oz)    SpO2: 98% 96% 97% 96%    General:A/O 4, NAD.,Sitting in Bed comfortably, No acute respiratory distress Eyes: negative scleral hemorrhage, negative anisocoria, negative icterus ENT: Negative Runny nose, negative gingival bleeding, Neck: Negative scars, masses, torticollis, lymphadenopathy, JVD, Lungs: distant breath sounds, negative wheezes, positive mild bibasilar crackles Cardiovascular: Regular rate and rhythm positive systolic murmur , negative gallop or rub normal S1 and S2 Abdomen: negative abdominal pain, nondistended, positive soft, bowel sounds, no rebound, no ascites, no appreciable mass   Discharge Instructions      Discharge Instructions    AMB Referral to [facility]    Complete by:  As directed   Reason for consult:  Post hospital follow up  Expected date of contact:  1-3 days (reserved for hospital discharges)  Please assign to community nurse for transition of care calls and assess for home visits. Will likely have home health.  Patient was active with [facility] prior to admission.  Questions please call:   Natividad Brood, RN BSN [facility] Liaison  [phone] business mobile phone Toll free office [phone]     Amb  Referral to Cardiac Rehabilitation    Complete by:  As directed   Diagnosis:   NSTEMI Coronary Stents              Medication List    STOP taking these medications        hydrALAZINE 10 MG tablet  Commonly known as:  APRESOLINE     hydrALAZINE 25 MG tablet  Commonly known as:  APRESOLINE     isosorbide mononitrate 30 MG 24 hr tablet  Commonly known as:  IMDUR     ivabradine 5 MG Tabs tablet  Commonly known as:  CORLANOR     Magnesium 250 MG Tabs      TAKE these medications        amiodarone 200 MG tablet  Commonly known as:   PACERONE  Take 1 tablet (200 mg total) by mouth 2 (two) times daily.     aspirin EC 81 MG tablet  Take 81 mg by mouth daily.     carvedilol 3.125 MG tablet  Commonly known as:  COREG  Take 1 tablet (3.125 mg total) by mouth 2 (two) times daily with a meal.     clopidogrel 75 MG tablet  Commonly known as:  PLAVIX  Take 1 tablet (75 mg total) by mouth daily.     collagenase ointment  Commonly known as:  SANTYL  Apply topically 2 (two) times daily.     dextromethorphan-guaiFENesin 30-600 MG 12hr tablet  Commonly known as:  MUCINEX DM  Take 1 tablet by mouth 2 (two) times daily.     digoxin 0.125 MG tablet  Commonly known as:  LANOXIN  Take 0.125 mg by mouth daily.     insulin glargine 100 UNIT/ML injection  Commonly known as:  LANTUS  Inject 20 Units into the skin at bedtime.     Insulin Pen Needle 32G X 4 MM Misc  Commonly known as:  BD PEN NEEDLE NANO U/F  Test once daily dx e10.9     ipratropium-albuterol 0.5-2.5 (3) MG/3ML Soln  Commonly known as:  DUONEB  Take 3 mLs by nebulization every 6 (six) hours as needed (Feeling of SOB).     mirtazapine 15 MG tablet  Commonly known as:  REMERON  Take 1 tablet (15 mg total) by mouth at bedtime.     multivitamin with minerals tablet  Take 1 tablet by mouth daily.     NOVOLOG FLEXPEN 100 UNIT/ML FlexPen  Generic drug:  insulin aspart  Inject 5-15 Units into the skin 3 (three) times daily with meals.     rosuvastatin 40 MG tablet  Commonly known as:  CRESTOR  TAKE 1 TABLET BY MOUTH DAILY     spironolactone 25 MG tablet  Commonly known as:  ALDACTONE  Take 0.5 tablets (12.5 mg total) by mouth daily.     tamsulosin 0.4 MG Caps capsule  Commonly known as:  FLOMAX  Take 1 capsule (0.4 mg total) by mouth daily.     torsemide 20 MG tablet  Commonly known as:  DEMADEX  Take 1 tablet (20 mg total) by mouth daily.       No Known Allergies Follow-up Information    Follow up with Loralie Champagne, MD.   Specialty:   Cardiology   Why:  Schedule follow-up with Dr. Loralie Champagne cardiology in 1-2 weeks NSTEMI   Contact information:   [id number] [place] [address] [phone]       Follow up with [name],  MD. Schedule an appointment as soon as possible for a visit in 1 week.   Specialty:  Family Medicine   Why:  Schedule follow-up with Dr. [name] aspiration pneumonia, uncontrolled diabetes type 2, HLD, NSTEMI   Contact information:   [address] [place] [address] [phone]       Follow up with [facility]             .   Contact information:   [address] [phone]       The results of significant diagnostics from this hospitalization (including imaging, microbiology, ancillary and laboratory) are listed below for reference.    Significant Diagnostic Studies: Ct Head Wo Contrast  10/27/2015  CLINICAL DATA:  Confusion.  Altered mental status. EXAM: CT HEAD WITHOUT CONTRAST TECHNIQUE: Contiguous axial images were obtained from the base of the skull through the vertex without intravenous contrast. COMPARISON:  CT head 04/10/2015 FINDINGS: Moderate atrophy is unchanged. Ventricular enlargement consistent with atrophy. Mild chronic white matter hypodensity bilaterally is stable Negative for acute infarct. Negative for acute hemorrhage or mass. No shift of the midline structures. Negative calvarium. Gas in the soft tissues round skullbase likely is related to IV placement. IMPRESSION: Atrophy with chronic microvascular ischemia.  No acute abnormality. Electronically Signed   By: Franchot Gallo M.D.   On: 10/27/2015 08:50   Dg Chest Port 1 View  11/04/2015  CLINICAL DATA:  Cough and fever EXAM: PORTABLE CHEST 1 VIEW COMPARISON:  10/28/2015 FINDINGS: Cardiac shadow remains enlarged. A defibrillator is again noted and stable. The endotracheal tube and nasogastric catheter  have been removed in the interval. Minimal left basilar atelectasis is seen. No bony abnormality is noted. IMPRESSION: Minimal left basilar atelectasis. Electronically Signed   By: Inez Catalina M.D.   On: 11/04/2015 09:40   Dg Chest Port 1 View  10/28/2015  CLINICAL DATA:  Respiratory failure.  ET tube placement EXAM: PORTABLE CHEST 1 VIEW COMPARISON:  10/27/2015 FINDINGS: Endotracheal tube with the tip 3.8 cm above the carina. Left jugular central venous catheter with the tip projecting over the upper SVC. Patchy right upper lobe airspace disease mildly improved compared with the prior examination most consistent with pneumonia. Mild airspace disease in the right lower lung. No pleural effusion or pneumothorax. Stable cardiomediastinal silhouette. Single lead AICD. Nasogastric tube coursing below the diaphragm. The osseous structures are unremarkable. IMPRESSION: 1. Support lines and tubing in satisfactory position. 2. Patchy right upper lobe airspace disease mildly improved compared with the prior examination consistent with persistent pneumonia. Mild airspace disease in the right lower lung likely reflecting pneumonia. Electronically Signed   By: Kathreen Devoid   On: 10/28/2015 10:10   Dg Chest Port 1 View  10/27/2015  CLINICAL DATA:  Central line placement EXAM: PORTABLE CHEST 1 VIEW COMPARISON:  10/27/2015 FINDINGS: Cardiomediastinal silhouette is stable. Worsening patchy pneumonia right upper lobe and right lower lobe. There is endotracheal tube in place with tip 5 cm above the carina. NG tube is noted with tip in proximal stomach just below the GE junction level. The NG tube should be advanced further into the stomach. There is left IJ central line with tip in upper SVC. No pneumothorax. IMPRESSION: Worsening patchy pneumonia right upper lobe and right lower lobe. There is endotracheal tube in place with tip 5 cm above the carina. NG tube is noted with tip in proximal stomach just below the GE junction  level. The NG tube should be advanced further into the stomach. There is left IJ central line with tip in upper SVC. No pneumothorax. Electronically Signed   By: Lahoma Crocker M.D.   On: 10/27/2015 14:54   Dg Chest Portable 1 View  10/27/2015  CLINICAL DATA:  Possible aspiration, nor is the breath sounds, tachycardia; history of CVA, coronary artery disease, and diabetes,; current mental status change. EXAM: PORTABLE CHEST 1 VIEW COMPARISON:  PA and lateral chest x-ray of July 27, 2015 FINDINGS: There is confluent alveolar opacity in the right upper lobe. The interstitial markings in the mid and lower right lung are coarse. There is subtle increased density approximately 2 cm above the dome of the hemidiaphragm that is new. The heart is top-normal in size. The pulmonary vascularity is not engorged. There is a permanent pacemaker defibrillator in stable position. The observed bony thorax exhibits no acute abnormality. IMPRESSION: Right upper lobe and left lower lobe pneumonia. Low-grade compensated CHF. Followup PA and lateral chest X-ray is recommended in 3-4 weeks following trial of antibiotic therapy to ensure resolution and exclude underlying malignancy. Electronically Signed   By: David  [name] M.D.   On: 10/27/2015 08:21    Microbiology: Recent Results (from the past 240 hour(s))  Blood culture (routine x 2)     Status: None   Collection Time: 10/27/15  8:57 AM  Result Value Ref Range Status   Specimen Description BLOOD LEFT FOREARM  Final   Special Requests BOTTLES DRAWN AEROBIC ONLY  8CC  Final   Culture NO GROWTH 5 DAYS  Final   Report Status 11/01/2015 FINAL  Final  Blood culture (routine x 2)     Status: None   Collection Time: 10/27/15  8:59 AM  Result Value Ref Range Status   Specimen Description BLOOD RIGHT ANTECUBITAL  Final   Special Requests BOTTLES DRAWN AEROBIC AND ANAEROBIC  5CC  Final   Culture NO GROWTH 5 DAYS  Final   Report Status 11/01/2015 FINAL  Final  Urine culture      Status: Abnormal   Collection Time: 10/27/15  9:00 AM  Result Value Ref Range Status   Specimen Description URINE, CATHETERIZED  Final   Special Requests Normal  Final   Culture >=100,000 COLONIES/mL KLEBSIELLA OXYTOCA (A)  Final   Report Status 10/29/2015 FINAL  Final   Organism ID, Bacteria KLEBSIELLA OXYTOCA (A)  Final      Susceptibility   Klebsiella oxytoca - MIC*    AMPICILLIN 16 RESISTANT Resistant     CEFAZOLIN <=4 SENSITIVE Sensitive     CEFTRIAXONE <=1 SENSITIVE Sensitive     CIPROFLOXACIN <=0.25 SENSITIVE Sensitive     GENTAMICIN <=1 SENSITIVE Sensitive     IMIPENEM <=0.25 SENSITIVE Sensitive     NITROFURANTOIN <=16 SENSITIVE Sensitive     TRIMETH/SULFA <=20 SENSITIVE Sensitive     AMPICILLIN/SULBACTAM 4 SENSITIVE Sensitive     PIP/TAZO <=4 SENSITIVE Sensitive     * >=100,000 COLONIES/mL KLEBSIELLA OXYTOCA  MRSA PCR Screening     Status: None   Collection Time: 10/27/15 11:28 AM  Result Value Ref Range Status   MRSA by PCR NEGATIVE NEGATIVE Final    Comment:        The GeneXpert MRSA Assay (FDA approved for NASAL specimens only), is one component of a comprehensive MRSA colonization surveillance program. It is not intended to diagnose MRSA infection nor to guide or monitor treatment for MRSA infections.   Culture, blood (routine x 2)  Status: None   Collection Time: 10/27/15 11:39 AM  Result Value Ref Range Status   Specimen Description BLOOD LEFT WRIST  Final   Special Requests BOTTLES DRAWN AEROBIC ONLY 5CC  Final   Culture NO GROWTH 5 DAYS  Final   Report Status 11/01/2015 FINAL  Final  Culture, blood (routine x 2)     Status: None   Collection Time: 10/27/15 11:44 AM  Result Value Ref Range Status   Specimen Description BLOOD LEFT HAND  Final   Special Requests IN PEDIATRIC BOTTLE 1CC  Final   Culture NO GROWTH 5 DAYS  Final   Report Status 11/01/2015 FINAL  Final  Culture, respiratory (NON-Expectorated)     Status: None   Collection Time: 10/28/15   1:05 PM  Result Value Ref Range Status   Specimen Description TRACHEAL ASPIRATE  Final   Special Requests Normal  Final   Gram Stain   Final    ABUNDANT WBC PRESENT, PREDOMINANTLY PMN NO SQUAMOUS EPITHELIAL CELLS SEEN NO ORGANISMS SEEN    Culture RARE CANDIDA ALBICANS  Final   Report Status 10/31/2015 FINAL  Final  Culture, blood (routine x 2)     Status: None (Preliminary result)   Collection Time: 11/04/15  9:39 AM  Result Value Ref Range Status   Specimen Description BLOOD RIGHT ANTECUBITAL  Final   Special Requests IN PEDIATRIC BOTTLE 1CC  Final   Culture NO GROWTH 1 DAY  Final   Report Status PENDING  Incomplete  Culture, blood (routine x 2)     Status: None (Preliminary result)   Collection Time: 11/04/15  9:45 AM  Result Value Ref Range Status   Specimen Description BLOOD BLOOD RIGHT ARM  Final   Special Requests IN PEDIATRIC BOTTLE 2CC  Final   Culture NO GROWTH 1 DAY  Final   Report Status PENDING  Incomplete  Culture, Urine     Status: None   Collection Time: 11/04/15 10:37 AM  Result Value Ref Range Status   Specimen Description URINE, CLEAN CATCH  Final   Special Requests NONE  Final   Culture NO GROWTH  Final   Report Status 11/05/2015 FINAL  Final  Culture, expectorated sputum-assessment     Status: None   Collection Time: 11/05/15 12:28 PM  Result Value Ref Range Status   Specimen Description SPUTUM  Final   Special Requests Immunocompromised  Final   Sputum evaluation   Final    THIS SPECIMEN IS ACCEPTABLE. RESPIRATORY CULTURE REPORT TO FOLLOW.   Report Status 11/05/2015 FINAL  Final  Culture, respiratory (NON-Expectorated)     Status: None (Preliminary result)   Collection Time: 11/05/15 12:28 PM  Result Value Ref Range Status   Specimen Description SPUTUM  Final   Special Requests NONE  Final   Gram Stain   Final    MODERATE WBC PRESENT, PREDOMINANTLY PMN ABUNDANT GRAM POSITIVE COCCI IN CLUSTERS IN PAIRS MODERATE GRAM NEGATIVE RODS    Culture  PENDING  Incomplete   Report Status PENDING  Incomplete     Labs: Basic Metabolic Panel:  Recent Labs Lab 11/01/15 0410  11/03/15 0330 11/04/15 0208 11/05/15 0307 11/05/15 1512 11/06/15 0316  NA 141  < > 140 139 140 140 139  K 3.9  < > 3.9 3.6 3.6 3.7 3.9  CL 114*  < > 109 106 109 105 108  CO2 22  < > 23 25 26 29 25   GLUCOSE 150*  < > 227* 112* 54* 181* 100*  BUN  19  < > 18 15 21* 20 21*  CREATININE 1.46*  < > 1.71* 1.78* 1.77* 1.89* 1.65*  CALCIUM 8.0*  < > 7.9* 7.8* 7.9* 7.9* 7.8*  MG 1.8  --   --   --  1.5* 1.6* 1.5*  < > = values in this interval not displayed. Liver Function Tests:  Recent Labs Lab 11/01/15 0410  AST 21  ALT 15*  ALKPHOS 140*  BILITOT 1.0  PROT 5.4*  ALBUMIN 2.1*   No results for input(s): LIPASE, AMYLASE in the last 168 hours. No results for input(s): AMMONIA in the last 168 hours. CBC:  Recent Labs Lab 11/03/15 0330 11/04/15 0208 11/05/15 0307 11/05/15 1512 11/06/15 0316  WBC 11.3* 8.5 10.0 7.9 8.8  NEUTROABS  --   --  6.7 5.7 6.4  HGB 10.5* 10.3* 10.1* 10.2* 10.1*  HCT 33.3* 33.0* 32.4* 32.9* 32.0*  MCV 89.0 89.4 89.5 90.1 89.9  PLT 242 267 334 305 299   Cardiac Enzymes: No results for input(s): CKTOTAL, CKMB, CKMBINDEX, TROPONINI in the last 168 hours. BNP: BNP (last 3 results)  Recent Labs  04/11/15 0400 07/26/15 1707 10/27/15 1124  BNP 2568.2* 2061.3* 1318.0*    ProBNP (last 3 results) No results for input(s): PROBNP in the last 8760 hours.  CBG:  Recent Labs Lab 11/05/15 1718 11/05/15 1942 11/05/15 2308 11/06/15 0330 11/06/15 0733  GLUCAP 190* 205* 180* 93 88       Signed:  Dia Crawford, MD [facility] [phone] pager

## 2015-11-06 NOTE — Progress Notes (Signed)
Discharge instructions provided, all questions answered, and patient escorted out via wheelchair to ride to rehab with spouse.

## 2015-11-06 NOTE — Clinical Social Work Note (Signed)
Clinical Social Worker facilitated patient discharge including contacting patient family and facility to confirm patient discharge plans.  Clinical information faxed to facility and family agreeable with plan.  Insurance authorization received.  CSW arranged transport via patient wife and daughter to [facility].  RN to call report prior to discharge.  Clinical Social Worker will sign off for now as social work intervention is no longer needed. Please consult [place] again if new need arises.  Barbette Or, [place]

## 2015-11-06 NOTE — Clinical Social Work Placement (Signed)
   CLINICAL SOCIAL WORK PLACEMENT  NOTE  Date:  11/06/2015  Patient Details  Name: [name] [name] MRN: [id number] Date of Birth: 10-23-1945  Clinical Social Work is seeking post-discharge placement for this patient at the [place] level of care (*CSW will initial, date and re-position this form in  chart as items are completed):  Yes   Patient/family provided with [facility] Work Department's list of facilities offering this level of care within the geographic area requested by the patient (or if unable, by the patient's family).  Yes   Patient/family informed of their freedom to choose among providers that offer the needed level of care, that participate in Medicare, Medicaid or managed care program needed by the patient, have an available bed and are willing to accept the patient.  Yes   Patient/family informed of [facility]'s ownership interest in [facility] and [facility], as well as of the fact that they are under no obligation to receive care at these facilities.  PASRR submitted to EDS on 11/04/15     PASRR number received on 11/04/15     Existing PASRR number confirmed on       FL2 transmitted to all facilities in geographic area requested by pt/family on 11/04/15     FL2 transmitted to all facilities within larger geographic area on       Patient informed that his/her managed care company has contracts with or will negotiate with certain facilities, including the following:        Yes   Patient/family informed of bed offers received.  Patient chooses bed at [facility]     Physician recommends and patient chooses bed at      Patient to be transferred to [facility] on 11/06/15.  Patient to be transferred to facility by [facility]     Patient family notified on 11/06/15 of transfer.  Name of family member notified:  Patient wife at bedside     PHYSICIAN Please sign FL2     Additional  Comment:   Barbette Or, [place]

## 2015-11-07 ENCOUNTER — Other Ambulatory Visit: Payer: Self-pay

## 2015-11-07 ENCOUNTER — Encounter: Payer: Self-pay | Admitting: Family Medicine

## 2015-11-07 DIAGNOSIS — I5042 Chronic combined systolic (congestive) and diastolic (congestive) heart failure: Secondary | ICD-10-CM

## 2015-11-07 LAB — CULTURE, RESPIRATORY

## 2015-11-07 LAB — CULTURE, RESPIRATORY W GRAM STAIN

## 2015-11-09 LAB — CULTURE, BLOOD (ROUTINE X 2)
CULTURE: NO GROWTH
Culture: NO GROWTH

## 2015-11-10 ENCOUNTER — Encounter: Payer: Self-pay | Admitting: *Deleted

## 2015-11-11 ENCOUNTER — Ambulatory Visit: Payer: Commercial Managed Care - HMO

## 2015-11-11 ENCOUNTER — Ambulatory Visit: Payer: Self-pay | Admitting: *Deleted

## 2015-11-11 ENCOUNTER — Encounter: Payer: Self-pay | Admitting: *Deleted

## 2015-11-11 ENCOUNTER — Other Ambulatory Visit: Payer: Commercial Managed Care - HMO | Admitting: *Deleted

## 2015-11-11 NOTE — Patient Outreach (Signed)
[name]) Care Management  11/11/2015  [name] [name] Jan 05, 1946 [id number]   CSW was able to make contact with patient today to perform the initial assessment, as well as assess and assist with social work needs and services, when [name] met with patient at [name] where patient currently resides to receive short-term rehabilitative services.  CSW introduced self, explained role and types of services provided through [facility] ([facility]).  CSW further explained to patient that CSW works with patient's RNCM, also with [facility], [name]. CSW then explained the reason for the call, indicating that Mrs. [name] [name] thought that patient would benefit from social work services and resources to assist with possible discharge planning needs and services from the skilled nursing facility.  CSW obtained two HIPAA compliant identifiers from patient, which included patient's name and date of birth. Patient admitted to being tired today, after having just worked with physical therapy for close to one hour.  Patient reported that he was planning to try and rest before his wife and daughters arrived for a visit.  Patient went on to say that he plans to return home to live with his wife, [name] [name] at time of discharge from [facility]. Patient is agreeable to having CSW work with him and [name] [name], Licensed Clinical Social Worker/Admissions Coordinator at [name] to assist with arranging home health services and durable medical equipment.  CSW will continue to follow along and is scheduled to meet with patient again on Friday, August 4th to attend the Discharge Planning Meeting. [name] [name], [name] [name], [name] [name], [name] [name]  Licensed Education officer, [facility]  Mailing [place] [address], [place], [place] [address] Physical  Address-[address], [place],  [address] Toll Free Main # [phone] Fax # [phone] Cell # [phone]  Fax # [phone]  [name]

## 2015-11-14 ENCOUNTER — Ambulatory Visit: Payer: Self-pay | Admitting: *Deleted

## 2015-11-14 DIAGNOSIS — J9600 Acute respiratory failure, unspecified whether with hypoxia or hypercapnia: Secondary | ICD-10-CM | POA: Diagnosis not present

## 2015-11-14 DIAGNOSIS — I5043 Acute on chronic combined systolic (congestive) and diastolic (congestive) heart failure: Secondary | ICD-10-CM | POA: Diagnosis not present

## 2015-11-14 DIAGNOSIS — J690 Pneumonitis due to inhalation of food and vomit: Secondary | ICD-10-CM | POA: Diagnosis not present

## 2015-11-14 DIAGNOSIS — R05 Cough: Secondary | ICD-10-CM | POA: Diagnosis not present

## 2015-11-15 ENCOUNTER — Ambulatory Visit: Payer: Commercial Managed Care - HMO | Admitting: Family Medicine

## 2015-11-16 ENCOUNTER — Encounter (HOSPITAL_BASED_OUTPATIENT_CLINIC_OR_DEPARTMENT_OTHER): Payer: Self-pay

## 2015-11-16 ENCOUNTER — Ambulatory Visit (HOSPITAL_COMMUNITY)
Admission: RE | Admit: 2015-11-16 | Discharge: 2015-11-16 | Disposition: A | Discharge status: Home / Self Care | Payer: Commercial Managed Care - HMO | Source: Ambulatory Visit | Attending: Surgery | Admitting: Surgery

## 2015-11-16 ENCOUNTER — Encounter (HOSPITAL_BASED_OUTPATIENT_CLINIC_OR_DEPARTMENT_OTHER): Payer: Commercial Managed Care - HMO | Attending: Surgery

## 2015-11-16 ENCOUNTER — Other Ambulatory Visit: Payer: Self-pay | Admitting: Surgery

## 2015-11-16 ENCOUNTER — Other Ambulatory Visit (HOSPITAL_COMMUNITY): Payer: Self-pay | Admitting: *Deleted

## 2015-11-16 DIAGNOSIS — N183 Chronic kidney disease, stage 3 (moderate): Secondary | ICD-10-CM | POA: Insufficient documentation

## 2015-11-16 DIAGNOSIS — I25119 Atherosclerotic heart disease of native coronary artery with unspecified angina pectoris: Secondary | ICD-10-CM | POA: Insufficient documentation

## 2015-11-16 DIAGNOSIS — L97522 Non-pressure chronic ulcer of other part of left foot with fat layer exposed: Secondary | ICD-10-CM | POA: Insufficient documentation

## 2015-11-16 DIAGNOSIS — E11621 Type 2 diabetes mellitus with foot ulcer: Secondary | ICD-10-CM | POA: Diagnosis not present

## 2015-11-16 DIAGNOSIS — E1165 Type 2 diabetes mellitus with hyperglycemia: Secondary | ICD-10-CM | POA: Diagnosis not present

## 2015-11-16 DIAGNOSIS — M7732 Calcaneal spur, left foot: Secondary | ICD-10-CM | POA: Diagnosis not present

## 2015-11-16 DIAGNOSIS — I5031 Acute diastolic (congestive) heart failure: Secondary | ICD-10-CM | POA: Diagnosis not present

## 2015-11-16 DIAGNOSIS — E1140 Type 2 diabetes mellitus with diabetic neuropathy, unspecified: Secondary | ICD-10-CM | POA: Insufficient documentation

## 2015-11-16 DIAGNOSIS — Z7982 Long term (current) use of aspirin: Secondary | ICD-10-CM | POA: Insufficient documentation

## 2015-11-16 DIAGNOSIS — M86172 Other acute osteomyelitis, left ankle and foot: Secondary | ICD-10-CM

## 2015-11-16 DIAGNOSIS — I130 Hypertensive heart and chronic kidney disease with heart failure and stage 1 through stage 4 chronic kidney disease, or unspecified chronic kidney disease: Secondary | ICD-10-CM | POA: Insufficient documentation

## 2015-11-16 DIAGNOSIS — E1122 Type 2 diabetes mellitus with diabetic chronic kidney disease: Secondary | ICD-10-CM | POA: Insufficient documentation

## 2015-11-16 DIAGNOSIS — I252 Old myocardial infarction: Secondary | ICD-10-CM | POA: Diagnosis not present

## 2015-11-16 DIAGNOSIS — Z794 Long term (current) use of insulin: Secondary | ICD-10-CM | POA: Insufficient documentation

## 2015-11-16 DIAGNOSIS — Z8673 Personal history of transient ischemic attack (TIA), and cerebral infarction without residual deficits: Secondary | ICD-10-CM | POA: Insufficient documentation

## 2015-11-16 DIAGNOSIS — I7789 Other specified disorders of arteries and arterioles: Secondary | ICD-10-CM | POA: Insufficient documentation

## 2015-11-16 DIAGNOSIS — I472 Ventricular tachycardia: Secondary | ICD-10-CM | POA: Insufficient documentation

## 2015-11-16 DIAGNOSIS — Z95810 Presence of automatic (implantable) cardiac defibrillator: Secondary | ICD-10-CM | POA: Insufficient documentation

## 2015-11-16 DIAGNOSIS — Z79899 Other long term (current) drug therapy: Secondary | ICD-10-CM | POA: Insufficient documentation

## 2015-11-16 DIAGNOSIS — I255 Ischemic cardiomyopathy: Secondary | ICD-10-CM | POA: Insufficient documentation

## 2015-11-17 DIAGNOSIS — J9600 Acute respiratory failure, unspecified whether with hypoxia or hypercapnia: Secondary | ICD-10-CM | POA: Diagnosis not present

## 2015-11-17 DIAGNOSIS — R05 Cough: Secondary | ICD-10-CM | POA: Diagnosis not present

## 2015-11-17 DIAGNOSIS — J690 Pneumonitis due to inhalation of food and vomit: Secondary | ICD-10-CM | POA: Diagnosis not present

## 2015-11-17 DIAGNOSIS — I5043 Acute on chronic combined systolic (congestive) and diastolic (congestive) heart failure: Secondary | ICD-10-CM | POA: Diagnosis not present

## 2015-11-21 ENCOUNTER — Ambulatory Visit (INDEPENDENT_AMBULATORY_CARE_PROVIDER_SITE_OTHER): Payer: Commercial Managed Care - HMO | Admitting: Neurology

## 2015-11-21 ENCOUNTER — Encounter: Payer: Self-pay | Admitting: Neurology

## 2015-11-21 ENCOUNTER — Ambulatory Visit
Admission: RE | Admit: 2015-11-21 | Discharge: 2015-11-21 | Disposition: A | Discharge status: Home / Self Care | Payer: Commercial Managed Care - HMO | Source: Ambulatory Visit | Attending: Neurology | Admitting: Neurology

## 2015-11-21 VITALS — BP 100/64 | HR 58 | Ht 74.0 in | Wt 146.1 lb

## 2015-11-21 DIAGNOSIS — R258 Other abnormal involuntary movements: Secondary | ICD-10-CM | POA: Diagnosis not present

## 2015-11-21 DIAGNOSIS — R259 Unspecified abnormal involuntary movements: Secondary | ICD-10-CM

## 2015-11-21 DIAGNOSIS — R531 Weakness: Secondary | ICD-10-CM

## 2015-11-21 DIAGNOSIS — E1142 Type 2 diabetes mellitus with diabetic polyneuropathy: Secondary | ICD-10-CM | POA: Insufficient documentation

## 2015-11-21 DIAGNOSIS — R278 Other lack of coordination: Secondary | ICD-10-CM

## 2015-11-21 DIAGNOSIS — M6289 Other specified disorders of muscle: Secondary | ICD-10-CM

## 2015-11-21 DIAGNOSIS — R29810 Facial weakness: Secondary | ICD-10-CM | POA: Diagnosis not present

## 2015-11-21 DIAGNOSIS — M21372 Foot drop, left foot: Secondary | ICD-10-CM
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
## 2015-11-21 NOTE — Patient Instructions (Addendum)
1.  CT head without contrast 2.  Routine EEG 3.  Recommend left foot AFO 4.  Continue rehab 5.  If no improvement with foot drop in the next 2-4 weeks, patient to call to schedule NCS/EMG   Return to clinic in 3 months

## 2015-11-22 ENCOUNTER — Encounter (HOSPITAL_COMMUNITY): Payer: Self-pay

## 2015-11-22 ENCOUNTER — Ambulatory Visit (HOSPITAL_COMMUNITY)
Admission: RE | Admit: 2015-11-22 | Discharge: 2015-11-22 | Disposition: A | Discharge status: Home / Self Care | Payer: Commercial Managed Care - HMO | Source: Ambulatory Visit | Attending: Cardiology | Admitting: Cardiology

## 2015-11-22 VITALS — BP 94/68 | HR 68 | Wt 154.0 lb

## 2015-11-22 DIAGNOSIS — Z794 Long term (current) use of insulin: Secondary | ICD-10-CM | POA: Insufficient documentation

## 2015-11-22 DIAGNOSIS — Z7982 Long term (current) use of aspirin: Secondary | ICD-10-CM | POA: Insufficient documentation

## 2015-11-22 DIAGNOSIS — R972 Elevated prostate specific antigen [PSA]: Secondary | ICD-10-CM | POA: Insufficient documentation

## 2015-11-22 DIAGNOSIS — N320 Bladder-neck obstruction: Secondary | ICD-10-CM | POA: Diagnosis not present

## 2015-11-22 DIAGNOSIS — I5042 Chronic combined systolic (congestive) and diastolic (congestive) heart failure: Secondary | ICD-10-CM

## 2015-11-22 DIAGNOSIS — N183 Chronic kidney disease, stage 3 unspecified: Secondary | ICD-10-CM

## 2015-11-22 DIAGNOSIS — Z825 Family history of asthma and other chronic lower respiratory diseases: Secondary | ICD-10-CM | POA: Insufficient documentation

## 2015-11-22 DIAGNOSIS — Z7902 Long term (current) use of antithrombotics/antiplatelets: Secondary | ICD-10-CM | POA: Insufficient documentation

## 2015-11-22 DIAGNOSIS — I130 Hypertensive heart and chronic kidney disease with heart failure and stage 1 through stage 4 chronic kidney disease, or unspecified chronic kidney disease: Secondary | ICD-10-CM | POA: Insufficient documentation

## 2015-11-22 DIAGNOSIS — Z95810 Presence of automatic (implantable) cardiac defibrillator: Secondary | ICD-10-CM | POA: Insufficient documentation

## 2015-11-22 DIAGNOSIS — I255 Ischemic cardiomyopathy: Secondary | ICD-10-CM | POA: Diagnosis not present

## 2015-11-22 DIAGNOSIS — N189 Chronic kidney disease, unspecified: Secondary | ICD-10-CM | POA: Diagnosis not present

## 2015-11-22 DIAGNOSIS — I472 Ventricular tachycardia: Secondary | ICD-10-CM | POA: Diagnosis not present

## 2015-11-22 DIAGNOSIS — Z79899 Other long term (current) drug therapy: Secondary | ICD-10-CM | POA: Insufficient documentation

## 2015-11-22 DIAGNOSIS — I252 Old myocardial infarction: Secondary | ICD-10-CM | POA: Insufficient documentation

## 2015-11-22 DIAGNOSIS — E1122 Type 2 diabetes mellitus with diabetic chronic kidney disease: Secondary | ICD-10-CM | POA: Insufficient documentation

## 2015-11-22 DIAGNOSIS — Z8673 Personal history of transient ischemic attack (TIA), and cerebral infarction without residual deficits: Secondary | ICD-10-CM | POA: Insufficient documentation

## 2015-11-22 DIAGNOSIS — I2510 Atherosclerotic heart disease of native coronary artery without angina pectoris: Secondary | ICD-10-CM | POA: Diagnosis not present

## 2015-11-22 DIAGNOSIS — G4730 Sleep apnea, unspecified: Secondary | ICD-10-CM | POA: Insufficient documentation

## 2015-11-22 DIAGNOSIS — E1142 Type 2 diabetes mellitus with diabetic polyneuropathy: Secondary | ICD-10-CM | POA: Insufficient documentation

## 2015-11-22 LAB — BASIC METABOLIC PANEL
ANION GAP: 7 (ref 5–15)
BUN: 36 mg/dL — ABNORMAL HIGH (ref 6–20)
CALCIUM: 8.7 mg/dL — AB (ref 8.9–10.3)
CO2: 28 mmol/L (ref 22–32)
CREATININE: 2.14 mg/dL — AB (ref 0.61–1.24)
Chloride: 105 mmol/L (ref 101–111)
GFR, EST AFRICAN AMERICAN: 35 mL/min — AB (ref >60)
GFR, EST NON AFRICAN AMERICAN: 30 mL/min — AB (ref >60)
Glucose, Bld: 206 mg/dL — ABNORMAL HIGH (ref 65–99)
Potassium: 4.8 mmol/L (ref 3.5–5.1)
Sodium: 140 mmol/L (ref 135–145)

## 2015-11-22 LAB — MAGNESIUM: Magnesium: 2.1 mg/dL (ref 1.7–2.4)

## 2015-11-22 MED ORDER — AMIODARONE HCL 200 MG PO TABS: 200.0000 mg | ORAL_TABLET | Freq: Every day | ORAL | 0 refills | Status: DC

## 2015-11-22 NOTE — Patient Instructions (Signed)
DECREASE Amiodarone to 200 mg tablet once daily.  Routine lab work today. Will notify you of abnormal results, otherwise no news is good news!  Follow up 4 weeks.  Do the following things EVERYDAY: 1) Weigh yourself in the morning before breakfast. Write it down and keep it in a log. 2) Take your medicines as prescribed 3) Eat low salt foods-Limit salt (sodium) to 2000 mg per day.  4) Stay as active as you can everyday 5) Limit all fluids for the day to less than 2 liters

## 2015-11-22 NOTE — Progress Notes (Addendum)
Advanced Heart Failure Medication Review by a Pharmacist  Does the patient  feel that his/her medications are working for him/her?  yes  Has the patient been experiencing any side effects to the medications prescribed?  no  Does the patient measure his/her own blood pressure or blood glucose at home?  yes   Does the patient have any problems obtaining medications due to transportation or finances?   no  Understanding of regimen: good Understanding of indications: good Potential of compliance: good Patient understands to avoid NSAIDs. Patient understands to avoid decongestants.  Issues to address at subsequent visits: None   Pharmacist comments:  Mr. Mcgilvery is a pleasant 70 yo M presenting with a current MAR from [facility]. Patient was recently discharged from hospital and all medications have been reviewed. No discrepancies noted.   Ruta Hinds. Velva Harman, PharmD, BCPS, CPP Clinical Pharmacist Pager: [phone] Phone: [phone] 11/22/2015 2:17 PM      Time with patient: 4 minutes Preparation and documentation time: 10 minutes Total time: 14 minutes
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
## 2015-11-23 ENCOUNTER — Encounter (HOSPITAL_BASED_OUTPATIENT_CLINIC_OR_DEPARTMENT_OTHER): Payer: Commercial Managed Care - HMO | Attending: Surgery

## 2015-11-23 DIAGNOSIS — E1122 Type 2 diabetes mellitus with diabetic chronic kidney disease: Secondary | ICD-10-CM | POA: Diagnosis not present

## 2015-11-23 DIAGNOSIS — E11621 Type 2 diabetes mellitus with foot ulcer: Secondary | ICD-10-CM | POA: Insufficient documentation

## 2015-11-23 DIAGNOSIS — I2510 Atherosclerotic heart disease of native coronary artery without angina pectoris: Secondary | ICD-10-CM | POA: Insufficient documentation

## 2015-11-23 DIAGNOSIS — I5031 Acute diastolic (congestive) heart failure: Secondary | ICD-10-CM | POA: Diagnosis not present

## 2015-11-23 DIAGNOSIS — L97821 Non-pressure chronic ulcer of other part of left lower leg limited to breakdown of skin: Secondary | ICD-10-CM | POA: Diagnosis not present

## 2015-11-23 DIAGNOSIS — E1140 Type 2 diabetes mellitus with diabetic neuropathy, unspecified: Secondary | ICD-10-CM | POA: Insufficient documentation

## 2015-11-23 DIAGNOSIS — I252 Old myocardial infarction: Secondary | ICD-10-CM | POA: Diagnosis not present

## 2015-11-23 DIAGNOSIS — L97522 Non-pressure chronic ulcer of other part of left foot with fat layer exposed: Secondary | ICD-10-CM | POA: Diagnosis not present

## 2015-11-23 DIAGNOSIS — I5040 Unspecified combined systolic (congestive) and diastolic (congestive) heart failure: Secondary | ICD-10-CM | POA: Diagnosis not present

## 2015-11-23 DIAGNOSIS — I130 Hypertensive heart and chronic kidney disease with heart failure and stage 1 through stage 4 chronic kidney disease, or unspecified chronic kidney disease: Secondary | ICD-10-CM | POA: Insufficient documentation

## 2015-11-23 DIAGNOSIS — N183 Chronic kidney disease, stage 3 (moderate): Secondary | ICD-10-CM | POA: Insufficient documentation

## 2015-11-23 DIAGNOSIS — G4730 Sleep apnea, unspecified: Secondary | ICD-10-CM | POA: Insufficient documentation

## 2015-11-24 ENCOUNTER — Other Ambulatory Visit: Payer: Self-pay | Admitting: *Deleted

## 2015-11-24 NOTE — Patient Outreach (Signed)
[place] [facility]) Care Management  11/24/2015  DANTAVIOUS GOBLIRSCH March 04, 1946 [id number]   [place] [facility]) Care Management  11/24/2015  DONAT GAMLIN 03-25-1946 [id number]   CSW was able to meet with patient today at [facility] [place] where patient currently resides to receive short-term rehabilitative services.  Patient appeared to be in good spirits today, having already worked with his physical therapist and pleased with his progress.  Patient is optimistic that he will be returning home soon.  Patient talked about his cardiologist appointment on Tuesday, August 1st and how he has been encouraged to weigh himself daily and watch his diet to avoid volume overload.  Patient reported that he has been working with [name], Licensed Clinical Social Worker/Admissions Coordinator at [name] to establish his Discharge Plan of Care.  Ms. Brigitte Pulse is working with patient to arrange home care services and durable medical equipment, through a home health agency of choice, prior to patient being discharged form the facility.  CSW will plan to meet with patient for the next routine visit, at [facility], on Thursday, August 17th to assist with possible discharge planning needs and services. [name], BSW, MSW, LCSW  Licensed Education officer, [facility]  [address] [place] [address], [place], [place] [address] Physical Address-[address], [place],  [address] Toll Free Main # [phone] Fax # [phone] Cell # [phone]  Fax # [phone]  [email or web address]

## 2015-11-25 ENCOUNTER — Ambulatory Visit: Payer: Self-pay | Admitting: *Deleted

## 2015-11-26 DIAGNOSIS — I1310 Hypertensive heart and chronic kidney disease without heart failure, with stage 1 through stage 4 chronic kidney disease, or unspecified chronic kidney disease: Secondary | ICD-10-CM | POA: Diagnosis not present

## 2015-11-26 DIAGNOSIS — E11621 Type 2 diabetes mellitus with foot ulcer: Secondary | ICD-10-CM | POA: Diagnosis not present

## 2015-11-26 DIAGNOSIS — L97521 Non-pressure chronic ulcer of other part of left foot limited to breakdown of skin: Secondary | ICD-10-CM | POA: Diagnosis not present

## 2015-11-26 DIAGNOSIS — E1122 Type 2 diabetes mellitus with diabetic chronic kidney disease: Secondary | ICD-10-CM | POA: Diagnosis not present

## 2015-11-26 DIAGNOSIS — I5043 Acute on chronic combined systolic (congestive) and diastolic (congestive) heart failure: Secondary | ICD-10-CM | POA: Diagnosis not present

## 2015-11-27 ENCOUNTER — Telehealth: Payer: Self-pay | Admitting: Family Medicine

## 2015-11-27 DIAGNOSIS — I5043 Acute on chronic combined systolic (congestive) and diastolic (congestive) heart failure: Secondary | ICD-10-CM | POA: Diagnosis not present

## 2015-11-27 DIAGNOSIS — E1122 Type 2 diabetes mellitus with diabetic chronic kidney disease: Secondary | ICD-10-CM | POA: Diagnosis not present

## 2015-11-27 DIAGNOSIS — L97521 Non-pressure chronic ulcer of other part of left foot limited to breakdown of skin: Secondary | ICD-10-CM | POA: Diagnosis not present

## 2015-11-27 DIAGNOSIS — I1310 Hypertensive heart and chronic kidney disease without heart failure, with stage 1 through stage 4 chronic kidney disease, or unspecified chronic kidney disease: Secondary | ICD-10-CM | POA: Diagnosis not present

## 2015-11-27 DIAGNOSIS — E11621 Type 2 diabetes mellitus with foot ulcer: Secondary | ICD-10-CM | POA: Diagnosis not present

## 2015-11-27 NOTE — Telephone Encounter (Signed)
Message from home health nurse that pt fell today and had a fever of 100.8 He was admitted 7/6 to 7/16 with acute respiratory failure secondary to pneumonia, NSTEMI, ischemic cardiomyopathy with systolic/ diastolic CHF, V tach (his IACD went off in the ED on 7/6) and a possible seizure, possible CVA, UTI, uncontrolled DM.  He was in the nursing home for rehab and then came home this past Friday.   Spoke with pt- he states that he feels fine, that he fell because he lost his balance.  He notes a "low grade fever."  His wife notes that his urine "smells strong" and that he has had intermittent fevers since he got home- less than 101 generally.  Advised his wife to take him to the ER today for further evaluation- if he absolutely will not go he is to be seen in the office tomorrow. She states understanding

## 2015-11-28 ENCOUNTER — Encounter: Payer: Self-pay | Admitting: Family Medicine

## 2015-11-28 ENCOUNTER — Ambulatory Visit (INDEPENDENT_AMBULATORY_CARE_PROVIDER_SITE_OTHER)
Admission: RE | Admit: 2015-11-28 | Discharge: 2015-11-28 | Disposition: A | Discharge status: Home / Self Care | Payer: Commercial Managed Care - HMO | Source: Ambulatory Visit | Attending: Family Medicine | Admitting: Family Medicine

## 2015-11-28 ENCOUNTER — Ambulatory Visit (INDEPENDENT_AMBULATORY_CARE_PROVIDER_SITE_OTHER): Payer: Commercial Managed Care - HMO | Admitting: Family Medicine

## 2015-11-28 VITALS — BP 110/62 | HR 64 | Temp 98.2°F | Ht 74.0 in | Wt 150.4 lb

## 2015-11-28 DIAGNOSIS — I5043 Acute on chronic combined systolic (congestive) and diastolic (congestive) heart failure: Secondary | ICD-10-CM | POA: Diagnosis not present

## 2015-11-28 DIAGNOSIS — E1122 Type 2 diabetes mellitus with diabetic chronic kidney disease: Secondary | ICD-10-CM | POA: Diagnosis not present

## 2015-11-28 DIAGNOSIS — L97521 Non-pressure chronic ulcer of other part of left foot limited to breakdown of skin: Secondary | ICD-10-CM | POA: Diagnosis not present

## 2015-11-28 DIAGNOSIS — R509 Fever, unspecified: Secondary | ICD-10-CM | POA: Diagnosis not present

## 2015-11-28 DIAGNOSIS — R05 Cough: Secondary | ICD-10-CM | POA: Diagnosis not present

## 2015-11-28 DIAGNOSIS — R059 Cough, unspecified: Secondary | ICD-10-CM

## 2015-11-28 DIAGNOSIS — I1310 Hypertensive heart and chronic kidney disease without heart failure, with stage 1 through stage 4 chronic kidney disease, or unspecified chronic kidney disease: Secondary | ICD-10-CM | POA: Diagnosis not present

## 2015-11-28 DIAGNOSIS — R399 Unspecified symptoms and signs involving the genitourinary system: Secondary | ICD-10-CM | POA: Diagnosis not present

## 2015-11-28 DIAGNOSIS — E11621 Type 2 diabetes mellitus with foot ulcer: Secondary | ICD-10-CM | POA: Diagnosis not present

## 2015-11-28 LAB — POCT URINALYSIS DIPSTICK
Bilirubin, UA: NEGATIVE
Blood, UA: NEGATIVE
Glucose, UA: NEGATIVE
KETONES UA: NEGATIVE
NITRITE UA: NEGATIVE
PH UA: 5.5
PROTEIN UA: POSITIVE
UROBILINOGEN UA: NEGATIVE

## 2015-11-28 MED ORDER — SULFAMETHOXAZOLE-TRIMETHOPRIM 800-160 MG PO TABS: 1.0000 | ORAL_TABLET | Freq: Every day | ORAL | 0 refills | Status: DC

## 2015-11-28 NOTE — Patient Instructions (Addendum)
A few things to remember from today's visit:   Fever, unspecified  Cough - Plan: DG Chest 2 View  Urinary symptom or sign - Plan: POC Urinalysis Dipstick, CBC with Differential/Platelet, sulfamethoxazole-trimethoprim (BACTRIM DS,SEPTRA DS) 800-160 MG tablet  No many antibiotics, loss and urine, because more concerned about urine infection Bactrim therapy started. Treatment will be changed according to chest x-ray.  Please be sure medication list is accurate. If a new problem present, please set up appointment sooner than planned today.

## 2015-11-28 NOTE — Progress Notes (Signed)
Pre visit review using our clinic review tool, if applicable. No additional management support is needed unless otherwise documented below in the visit note. 

## 2015-11-28 NOTE — Progress Notes (Signed)
HPI:  ACUTE VISIT:  Chief Complaint  Patient presents with  . Fever    Pt came home from rehab on friday. Pt fell in wife's bathroom. He's had a cough and his urine has a foul oder, and has also had a low grade fever.    [name] [name] is a 70 y.o. male, who is here today with his wife, who is concerned about persistent "low grade fever", according to wife he has had fever since he was in the hospital, max 99.6 F. Wife states that his urine if odorous and cloudy, he denies any dysuria, abdominal pain, or changes in urinary frequency. + Nocturia x 2, stable.  Hx of BPH and has had UTI before, treated while he was hospitalized.  He is also having non productive cough, which is improving. + Wheezing 1-2 days ago. No chest pain or dyspnea. Good appetite.  He was hospitalized 10/27/15 and discharged 11/06/15 Dx with aspiration pneumonia and acute respiratory failure. DM II, BS's high 100's and low 200's.    Lab Results  Component Value Date   WBC 8.8 11/06/2015   HGB 10.1 (L) 11/06/2015   HCT 32.0 (L) 11/06/2015   MCV 89.9 11/06/2015   PLT 299 11/06/2015    Denies nausea, vomiting, changes in bowel habits, blood in stool or melena.  No sick contact.  In general he ans his wife feel like he is progressively getting better after hospital discharge, getting "stronger." He had a fall in the bathroom on 11/25/15, he was not using his walker but rather his cane, tripped. Denies head trauma or LOC. He is currently doing PT.    Review of Systems  Constitutional: Positive for fatigue and fever. Negative for appetite change, chills and diaphoresis.  HENT: Positive for postnasal drip. Negative for congestion, mouth sores, sneezing, sore throat, trouble swallowing and voice change.   Respiratory: Positive for cough and wheezing. Negative for chest tightness and shortness of breath.   Cardiovascular: Negative.   Gastrointestinal: Negative for abdominal pain, nausea and  vomiting.       No changes in bowel habits.  Genitourinary: Negative for dysuria, frequency, hematuria and urgency.  Musculoskeletal: Negative for back pain and neck pain.  Skin: Negative for pallor and rash.  Neurological: Negative for syncope, weakness and headaches.  Psychiatric/Behavioral: Negative for confusion. The patient is not nervous/anxious.       Current Outpatient Prescriptions on File Prior to Visit  Medication Sig Dispense Refill  . ACCU-CHEK AVIVA PLUS test strip     . amiodarone (PACERONE) 200 MG tablet Take 1 tablet (200 mg total) by mouth daily. 60 tablet 0  . aspirin EC 81 MG tablet Take 81 mg by mouth daily.    . ASSURE COMFORT LANCETS 30G MISC     . Blood Glucose Calibration (ACCU-CHEK AVIVA) SOLN     . Blood Glucose Monitoring Suppl (ACCU-CHEK AVIVA PLUS) w/Device KIT     . carvedilol (COREG) 3.125 MG tablet Take 1 tablet (3.125 mg total) by mouth 2 (two) times daily with a meal. 60 tablet 0  . clopidogrel (PLAVIX) 75 MG tablet Take 1 tablet (75 mg total) by mouth daily. 30 tablet 0  . collagenase (SANTYL) ointment Apply topically 2 (two) times daily. 15 g 0  . dextromethorphan-guaiFENesin (MUCINEX DM) 30-600 MG 12hr tablet Take 1 tablet by mouth 2 (two) times daily. 30 tablet 0  . digoxin (LANOXIN) 0.125 MG tablet Take 0.125 mg by mouth daily.    Marland Kitchen  insulin aspart (NOVOLOG FLEXPEN) 100 UNIT/ML FlexPen Inject 5-15 Units into the skin 3 (three) times daily with meals.    . insulin detemir (LEVEMIR) 100 UNIT/ML injection Inject 20 Units into the skin at bedtime.    Marland Kitchen ipratropium-albuterol (DUONEB) 0.5-2.5 (3) MG/3ML SOLN Take 3 mLs by nebulization every 6 (six) hours as needed (Feeling of SOB). 360 mL 0  . Lancet Devices (ADJUSTABLE LANCING DEVICE) MISC     . mirtazapine (REMERON) 15 MG tablet Take 1 tablet (15 mg total) by mouth at bedtime. 30 tablet 0  . multivitamin-iron-minerals-folic acid (THERAPEUTIC-M) TABS tablet Take 1 tablet by mouth daily.    .  rosuvastatin (CRESTOR) 40 MG tablet TAKE 1 TABLET BY MOUTH DAILY 90 tablet 0  . spironolactone (ALDACTONE) 25 MG tablet Take 0.5 tablets (12.5 mg total) by mouth daily. 15 tablet 6  . SURE COMFORT PEN NEEDLES 31G X 8 MM MISC     . tamsulosin (FLOMAX) 0.4 MG CAPS capsule Take 1 capsule (0.4 mg total) by mouth daily. 30 capsule 6  . torsemide (DEMADEX) 20 MG tablet Take 1 tablet (20 mg total) by mouth daily. 60 tablet 6  . vitamin A 10000 UNIT capsule Take 10,000 Units by mouth daily.    . vitamin C (ASCORBIC ACID) 500 MG tablet Take 500 mg by mouth daily.    Marland Kitchen zinc sulfate 220 (50 Zn) MG capsule Take 220 mg by mouth daily.     No current facility-administered medications on file prior to visit.      Past Medical History:  Diagnosis Date  . Automatic implantable cardioverter-defibrillator in situ   . CAD (coronary artery disease)    a. LHC (08/2005):  Ostial LAD 99%, mid LAD 50%, superior D1 80%, inferior D1 75%, mid OM proximal 80%, proximal PDA 25-30%, mid RCA 99%.   MRI with full thickness scar.  Med Rx.  2015 demonstrated similar diffuse three-vessel coronary disease. Perfusion imaging with rest we distribution demonstrated no viability in the area of the LAD and circumflex. Medical management.  . CAD, multiple vessel, RCA 100% mid occl.; LAD 99% stenosisprox.  mid of 50-60%; LCX OM-1 90%, OM-2 99%,AV groove 80%  12/10/2013  . Chronic combined systolic and diastolic heart failure ([place])   . CKD (chronic kidney disease)   . Gout   . Hx of echocardiogram 2015   Echo (08/2013):  EF 10-15%, diff HK, mod LAE, severe RVE, mod reduced RVSF, mild RAE, PASP 41 mmHg.  Marland Kitchen Hypertension   . Ischemic cardiomyopathy    Echo (8/11):  EF 40-45%;  Echo (5/15):  EF 10-15%  . Myocardial infarction [facility]) 2007   out of hosp MI - LHC with 3v CAD rx medically  . NSVT (nonsustained ventricular tachycardia) (HCC)    s/p ICD  . Peripheral neuropathy ([place])   . Sleep apnea    "has CPAP; won't use it"  . Stroke  [facility]) ~ 2013   "right leg weak since" (12/09/2013)  . Type II diabetes mellitus ([place]) dx'd 2005   No Known Allergies  Social History   Social History  . Marital status: Married    Spouse name: N/A  . Number of children: N/A  . Years of education: N/A   Social History Main Topics  . Smoking status: Never Smoker  . Smokeless tobacco: Never Used  . Alcohol use No  . Drug use: No  . Sexual activity: Not Asked   Other Topics Concern  . 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   He lives with wife.   Highest level of education:  High school       Vitals:   11/28/15 1405  BP: 110/62  Pulse: 64  Temp: 98.2 F (36.8 C)   Body mass index is 19.31 kg/m.  O2 sat at RA 96%    Physical Exam  Nursing note and vitals reviewed. Constitutional: He is oriented to person, place, and time. He appears well-developed. No distress.  HENT:  Head: Atraumatic.  Mouth/Throat: Oropharynx is clear and moist and mucous membranes are normal.  Eyes: Conjunctivae and EOM are normal.  Cardiovascular: Normal rate and regular rhythm.   Murmur (SEM II/VI RUSB) heard. Pulses:      Dorsalis pedis pulses are 2+ on the right side, and 2+ on the left side.  Respiratory: Effort normal. No respiratory distress. He has rales (fine) in the left lower field.  GI: Soft. He exhibits no mass. There is no tenderness. There is no CVA tenderness.  Musculoskeletal: He exhibits no edema.  Lymphadenopathy:    He has no cervical adenopathy.  Neurological: He is alert and oriented to person, place, and time.  Stable gait assisted with a walker  Skin: Skin is warm. No erythema.  Psychiatric: He has a normal mood and affect.  Poor eye contact, well groomed.      ASSESSMENT AND PLAN:     [place] was seen today for fever.  Diagnoses and all orders for this visit:  Fever,  unspecified  Seems like fever has been present since hospitalization. No clear source, he is reporting feeling better: cough improving and no dysuria or abdominal pain. Urine dip, CBC, and CXR ordered today. Continue monitoring temp.  Cough  Improving. Because fever and fine rales left base CXR ordered. Instructed about warning signs.  -     DG Chest 2 View; Future  Urinary symptom or sign  Urine dip + LEUK and protein. Possible causes of reported urinary symptoms discussed. Ucx ordered.  I would like to treat with a Quinolone given the fact I am not sure if fever has pulmonary or urinary etiology but he has Hx of arrhythmia and had episode of V tach. So for now he will take Bactrim DS once daily (e GFR 30).  Treatment will be tailored according to Ucx results and susceptibility report.  Clearly instructed about warning signs.   -     POC Urinalysis Dipstick -     CBC with Differential/Platelet -     sulfamethoxazole-trimethoprim (BACTRIM DS,SEPTRA DS) 800-160 MG tablet; Take 1 tablet by mouth daily.       -[name] advised to return or notify a doctor immediately if symptoms worsen or persist or new concerns arise.       [name] G. [name], MD  [facility]. [place] office.

## 2015-11-29 ENCOUNTER — Telehealth: Payer: Self-pay | Admitting: *Deleted

## 2015-11-29 DIAGNOSIS — I1310 Hypertensive heart and chronic kidney disease without heart failure, with stage 1 through stage 4 chronic kidney disease, or unspecified chronic kidney disease: Secondary | ICD-10-CM | POA: Diagnosis not present

## 2015-11-29 DIAGNOSIS — E1122 Type 2 diabetes mellitus with diabetic chronic kidney disease: Secondary | ICD-10-CM | POA: Diagnosis not present

## 2015-11-29 DIAGNOSIS — L97521 Non-pressure chronic ulcer of other part of left foot limited to breakdown of skin: Secondary | ICD-10-CM | POA: Diagnosis not present

## 2015-11-29 DIAGNOSIS — I5043 Acute on chronic combined systolic (congestive) and diastolic (congestive) heart failure: Secondary | ICD-10-CM | POA: Diagnosis not present

## 2015-11-29 DIAGNOSIS — E11621 Type 2 diabetes mellitus with foot ulcer: Secondary | ICD-10-CM | POA: Diagnosis not present

## 2015-11-29 LAB — CBC WITH DIFFERENTIAL/PLATELET
Basophils Absolute: 0 10*3/uL (ref 0.0–0.1)
Basophils Relative: 0.2 % (ref 0.0–3.0)
Eosinophils Absolute: 0.4 10*3/uL (ref 0.0–0.7)
Eosinophils Relative: 3.1 % (ref 0.0–5.0)
HCT: 32.6 % — ABNORMAL LOW (ref 39.0–52.0)
Hemoglobin: 10.5 g/dL — ABNORMAL LOW (ref 13.0–17.0)
Lymphocytes Relative: 5.9 % — ABNORMAL LOW (ref 12.0–46.0)
Lymphs Abs: 0.8 10*3/uL (ref 0.7–4.0)
MCHC: 32.4 g/dL (ref 30.0–36.0)
MCV: 87.4 fl (ref 78.0–100.0)
Monocytes Absolute: 0.6 10*3/uL (ref 0.1–1.0)
Monocytes Relative: 4.4 % (ref 3.0–12.0)
Neutro Abs: 11.3 10*3/uL — ABNORMAL HIGH (ref 1.4–7.7)
Neutrophils Relative %: 86.4 % — ABNORMAL HIGH (ref 43.0–77.0)
Platelets: 275 10*3/uL (ref 150.0–400.0)
RBC: 3.73 Mil/uL — ABNORMAL LOW (ref 4.22–5.81)
RDW: 15.7 % — ABNORMAL HIGH (ref 11.5–15.5)
WBC: 13.1 10*3/uL — ABNORMAL HIGH (ref 4.0–10.5)

## 2015-11-29 NOTE — Telephone Encounter (Signed)
Please advise 

## 2015-11-29 NOTE — Telephone Encounter (Signed)
Buzz, RN with [facility] called to report the pt had a fall today approximately 45 minutes ago.  Patient did not lose consciousness, he is alert and oriented x3, bruise noted on the right knee and right eye socket.  Buzz can be reached at [phone].

## 2015-11-30 ENCOUNTER — Telehealth: Payer: Self-pay | Admitting: Family Medicine

## 2015-11-30 DIAGNOSIS — E11621 Type 2 diabetes mellitus with foot ulcer: Secondary | ICD-10-CM | POA: Diagnosis not present

## 2015-11-30 DIAGNOSIS — I130 Hypertensive heart and chronic kidney disease with heart failure and stage 1 through stage 4 chronic kidney disease, or unspecified chronic kidney disease: Secondary | ICD-10-CM | POA: Diagnosis not present

## 2015-11-30 DIAGNOSIS — L97821 Non-pressure chronic ulcer of other part of left lower leg limited to breakdown of skin: Secondary | ICD-10-CM | POA: Diagnosis not present

## 2015-11-30 DIAGNOSIS — L97522 Non-pressure chronic ulcer of other part of left foot with fat layer exposed: Secondary | ICD-10-CM | POA: Diagnosis not present

## 2015-11-30 DIAGNOSIS — I252 Old myocardial infarction: Secondary | ICD-10-CM | POA: Diagnosis not present

## 2015-11-30 DIAGNOSIS — G4730 Sleep apnea, unspecified: Secondary | ICD-10-CM | POA: Diagnosis not present

## 2015-11-30 DIAGNOSIS — I5040 Unspecified combined systolic (congestive) and diastolic (congestive) heart failure: Secondary | ICD-10-CM | POA: Diagnosis not present

## 2015-11-30 DIAGNOSIS — E1122 Type 2 diabetes mellitus with diabetic chronic kidney disease: Secondary | ICD-10-CM | POA: Diagnosis not present

## 2015-11-30 DIAGNOSIS — L89892 Pressure ulcer of other site, stage 2: Secondary | ICD-10-CM | POA: Diagnosis not present

## 2015-11-30 DIAGNOSIS — E1140 Type 2 diabetes mellitus with diabetic neuropathy, unspecified: Secondary | ICD-10-CM | POA: Diagnosis not present

## 2015-11-30 DIAGNOSIS — I5031 Acute diastolic (congestive) heart failure: Secondary | ICD-10-CM | POA: Diagnosis not present

## 2015-11-30 DIAGNOSIS — I2510 Atherosclerotic heart disease of native coronary artery without angina pectoris: Secondary | ICD-10-CM | POA: Diagnosis not present

## 2015-11-30 NOTE — Telephone Encounter (Signed)
Approval for Home Health orders skill nursing and occupational therapy and home aide.  Pt had aspiration pneumonia and a heartache.

## 2015-11-30 NOTE — Telephone Encounter (Signed)
Called and spoke with [facility] and let her know orders were okay.

## 2015-11-30 NOTE — Telephone Encounter (Signed)
Ok to give verbal orders. Thanks, BJ

## 2015-11-30 NOTE — Telephone Encounter (Signed)
Local ice, monitor for MS changes. How is fever? Thanks, BJ

## 2015-11-30 NOTE — Telephone Encounter (Signed)
Orders okay? 

## 2015-11-30 NOTE — Telephone Encounter (Signed)
Called [name] and gave message below. Called [name] and ask how fever was doing. He has not had a fever last night, this morning, or now.

## 2015-12-01 DIAGNOSIS — L97521 Non-pressure chronic ulcer of other part of left foot limited to breakdown of skin: Secondary | ICD-10-CM | POA: Diagnosis not present

## 2015-12-01 DIAGNOSIS — I1310 Hypertensive heart and chronic kidney disease without heart failure, with stage 1 through stage 4 chronic kidney disease, or unspecified chronic kidney disease: Secondary | ICD-10-CM | POA: Diagnosis not present

## 2015-12-01 DIAGNOSIS — E1122 Type 2 diabetes mellitus with diabetic chronic kidney disease: Secondary | ICD-10-CM | POA: Diagnosis not present

## 2015-12-01 DIAGNOSIS — E11621 Type 2 diabetes mellitus with foot ulcer: Secondary | ICD-10-CM | POA: Diagnosis not present

## 2015-12-01 DIAGNOSIS — I5043 Acute on chronic combined systolic (congestive) and diastolic (congestive) heart failure: Secondary | ICD-10-CM | POA: Diagnosis not present

## 2015-12-02 DIAGNOSIS — E11621 Type 2 diabetes mellitus with foot ulcer: Secondary | ICD-10-CM | POA: Diagnosis not present

## 2015-12-02 DIAGNOSIS — I1310 Hypertensive heart and chronic kidney disease without heart failure, with stage 1 through stage 4 chronic kidney disease, or unspecified chronic kidney disease: Secondary | ICD-10-CM | POA: Diagnosis not present

## 2015-12-02 DIAGNOSIS — L97521 Non-pressure chronic ulcer of other part of left foot limited to breakdown of skin: Secondary | ICD-10-CM | POA: Diagnosis not present

## 2015-12-02 DIAGNOSIS — I5043 Acute on chronic combined systolic (congestive) and diastolic (congestive) heart failure: Secondary | ICD-10-CM | POA: Diagnosis not present

## 2015-12-02 DIAGNOSIS — E1122 Type 2 diabetes mellitus with diabetic chronic kidney disease: Secondary | ICD-10-CM | POA: Diagnosis not present

## 2015-12-05 ENCOUNTER — Ambulatory Visit: Payer: Commercial Managed Care - HMO | Admitting: Neurology

## 2015-12-05 DIAGNOSIS — L97521 Non-pressure chronic ulcer of other part of left foot limited to breakdown of skin: Secondary | ICD-10-CM | POA: Diagnosis not present

## 2015-12-05 DIAGNOSIS — I1310 Hypertensive heart and chronic kidney disease without heart failure, with stage 1 through stage 4 chronic kidney disease, or unspecified chronic kidney disease: Secondary | ICD-10-CM | POA: Diagnosis not present

## 2015-12-05 DIAGNOSIS — E1122 Type 2 diabetes mellitus with diabetic chronic kidney disease: Secondary | ICD-10-CM | POA: Diagnosis not present

## 2015-12-05 DIAGNOSIS — E11621 Type 2 diabetes mellitus with foot ulcer: Secondary | ICD-10-CM | POA: Diagnosis not present

## 2015-12-05 DIAGNOSIS — I5043 Acute on chronic combined systolic (congestive) and diastolic (congestive) heart failure: Secondary | ICD-10-CM | POA: Diagnosis not present

## 2015-12-06 ENCOUNTER — Telehealth: Payer: Self-pay | Admitting: Family Medicine

## 2015-12-06 ENCOUNTER — Other Ambulatory Visit: Payer: Self-pay

## 2015-12-06 DIAGNOSIS — I1310 Hypertensive heart and chronic kidney disease without heart failure, with stage 1 through stage 4 chronic kidney disease, or unspecified chronic kidney disease: Secondary | ICD-10-CM | POA: Diagnosis not present

## 2015-12-06 DIAGNOSIS — E1122 Type 2 diabetes mellitus with diabetic chronic kidney disease: Secondary | ICD-10-CM | POA: Diagnosis not present

## 2015-12-06 DIAGNOSIS — E11621 Type 2 diabetes mellitus with foot ulcer: Secondary | ICD-10-CM | POA: Diagnosis not present

## 2015-12-06 DIAGNOSIS — I5043 Acute on chronic combined systolic (congestive) and diastolic (congestive) heart failure: Secondary | ICD-10-CM | POA: Diagnosis not present

## 2015-12-06 DIAGNOSIS — L97521 Non-pressure chronic ulcer of other part of left foot limited to breakdown of skin: Secondary | ICD-10-CM | POA: Diagnosis not present

## 2015-12-06 MED ORDER — CARVEDILOL 3.125 MG PO TABS: ORAL_TABLET | ORAL | 3 refills | Status: DC

## 2015-12-06 NOTE — Telephone Encounter (Signed)
Coreg can be decreased from a tab bid to 1/2 tab bid. Continue monitoring BP. Fall prevention education: slow movements, assistance (cane or walker), and adequate hydration.  Thanks, BJ

## 2015-12-06 NOTE — Telephone Encounter (Signed)
Please advise 

## 2015-12-06 NOTE — Telephone Encounter (Signed)
New rx sent to pharmacy. Gave your response to Urvi, patient already has a walker/cane. Advised her to tell him to move slower.

## 2015-12-06 NOTE — Telephone Encounter (Signed)
Pt had a fall Saturday. She saw him today and he lost his balance 2 times. Pt's resting bp was 110/70, but when he stands up it drops. It dropped to 96/60 today.  Would like to know what you would have do?

## 2015-12-07 ENCOUNTER — Telehealth: Payer: Self-pay | Admitting: Family Medicine

## 2015-12-07 ENCOUNTER — Encounter: Payer: Self-pay | Admitting: Internal Medicine

## 2015-12-07 DIAGNOSIS — I130 Hypertensive heart and chronic kidney disease with heart failure and stage 1 through stage 4 chronic kidney disease, or unspecified chronic kidney disease: Secondary | ICD-10-CM | POA: Diagnosis not present

## 2015-12-07 DIAGNOSIS — E1122 Type 2 diabetes mellitus with diabetic chronic kidney disease: Secondary | ICD-10-CM | POA: Diagnosis not present

## 2015-12-07 DIAGNOSIS — I25119 Atherosclerotic heart disease of native coronary artery with unspecified angina pectoris: Secondary | ICD-10-CM | POA: Diagnosis not present

## 2015-12-07 DIAGNOSIS — I252 Old myocardial infarction: Secondary | ICD-10-CM | POA: Diagnosis not present

## 2015-12-07 DIAGNOSIS — L97521 Non-pressure chronic ulcer of other part of left foot limited to breakdown of skin: Secondary | ICD-10-CM | POA: Diagnosis not present

## 2015-12-07 DIAGNOSIS — G4730 Sleep apnea, unspecified: Secondary | ICD-10-CM | POA: Diagnosis not present

## 2015-12-07 DIAGNOSIS — I1310 Hypertensive heart and chronic kidney disease without heart failure, with stage 1 through stage 4 chronic kidney disease, or unspecified chronic kidney disease: Secondary | ICD-10-CM | POA: Diagnosis not present

## 2015-12-07 DIAGNOSIS — E1140 Type 2 diabetes mellitus with diabetic neuropathy, unspecified: Secondary | ICD-10-CM | POA: Diagnosis not present

## 2015-12-07 DIAGNOSIS — I5040 Unspecified combined systolic (congestive) and diastolic (congestive) heart failure: Secondary | ICD-10-CM | POA: Diagnosis not present

## 2015-12-07 DIAGNOSIS — E11621 Type 2 diabetes mellitus with foot ulcer: Secondary | ICD-10-CM | POA: Diagnosis not present

## 2015-12-07 DIAGNOSIS — I2510 Atherosclerotic heart disease of native coronary artery without angina pectoris: Secondary | ICD-10-CM | POA: Diagnosis not present

## 2015-12-07 DIAGNOSIS — L97821 Non-pressure chronic ulcer of other part of left lower leg limited to breakdown of skin: Secondary | ICD-10-CM | POA: Diagnosis not present

## 2015-12-07 DIAGNOSIS — I5043 Acute on chronic combined systolic (congestive) and diastolic (congestive) heart failure: Secondary | ICD-10-CM | POA: Diagnosis not present

## 2015-12-07 DIAGNOSIS — L89892 Pressure ulcer of other site, stage 2: Secondary | ICD-10-CM | POA: Diagnosis not present

## 2015-12-07 NOTE — Telephone Encounter (Signed)
Orders okay? 

## 2015-12-07 NOTE — Telephone Encounter (Signed)
Don OT would like to have verbal orders for 1x  for 4 weeks evaluation on the 7th for ADL, transfer, exercise, fall prevention, health promotion, and  infection control.  Okay to discharge when goals are met or at max potential .

## 2015-12-07 NOTE — Telephone Encounter (Signed)
It is ok to arrange for OT evaluation and treatment. Thanks, BJ

## 2015-12-07 NOTE — Telephone Encounter (Signed)
Left voicemail letting [name] [name] know that orders are okay & to call with any questions.

## 2015-12-08 ENCOUNTER — Other Ambulatory Visit: Payer: Commercial Managed Care - HMO

## 2015-12-08 ENCOUNTER — Other Ambulatory Visit: Payer: Self-pay | Admitting: Family Medicine

## 2015-12-08 ENCOUNTER — Other Ambulatory Visit: Payer: Self-pay | Admitting: *Deleted

## 2015-12-08 DIAGNOSIS — I5043 Acute on chronic combined systolic (congestive) and diastolic (congestive) heart failure: Secondary | ICD-10-CM | POA: Diagnosis not present

## 2015-12-08 DIAGNOSIS — L97521 Non-pressure chronic ulcer of other part of left foot limited to breakdown of skin: Secondary | ICD-10-CM | POA: Diagnosis not present

## 2015-12-08 DIAGNOSIS — E11621 Type 2 diabetes mellitus with foot ulcer: Secondary | ICD-10-CM | POA: Diagnosis not present

## 2015-12-08 DIAGNOSIS — I1310 Hypertensive heart and chronic kidney disease without heart failure, with stage 1 through stage 4 chronic kidney disease, or unspecified chronic kidney disease: Secondary | ICD-10-CM | POA: Diagnosis not present

## 2015-12-08 DIAGNOSIS — E1122 Type 2 diabetes mellitus with diabetic chronic kidney disease: Secondary | ICD-10-CM | POA: Diagnosis not present

## 2015-12-08 NOTE — Patient Outreach (Signed)
[facility]) Care Management  12/08/2015  [name] [name] 1946/04/08 [id number]  CSW drove to [place] where patient was residing to receive short-term rehabilitative services; however, patient was not present, having already been discharged back home.  CSW was able to make contact with patient at home to ensure that all discharge planning arrangements had been made for patient, prior to discharging home.  Patient has home health physical therapy and occupational therapy, as well as a walker and wheelchair for home use.  Patient denies needing any additional social work needs at present.   CSW will perform a case closure on patient, as all goals of treatment have been met and no additional social work needs have been identified at present.  CSW will notify patient's RNCM with [facility], [name] of CSW's plans to close patient's case.  CSW will fax an update to patient's Primary Care Physician, Dr. [name] to ensure that they are aware of CSW's involvement with patient's plan of care.  CSW will submit a case closure request to Verlon Setting, Care Management Assistant with [facility], in the form of an [employer].  CSW will ensure that [name] is aware of [name], RNCM with [facility], continued involvement with patient's care. [name], BSW, MSW, LCSW  Licensed Education officer, [facility]  Mailing [place] [address], [place], [place] [address] Physical Address-[address], [place], [place] [address] Toll Free Main # [phone] Fax # [phone] Cell # [phone]  Fax # [phone]  [email or web address]

## 2015-12-09 DIAGNOSIS — E11621 Type 2 diabetes mellitus with foot ulcer: Secondary | ICD-10-CM | POA: Diagnosis not present

## 2015-12-09 DIAGNOSIS — I1310 Hypertensive heart and chronic kidney disease without heart failure, with stage 1 through stage 4 chronic kidney disease, or unspecified chronic kidney disease: Secondary | ICD-10-CM | POA: Diagnosis not present

## 2015-12-09 DIAGNOSIS — E1122 Type 2 diabetes mellitus with diabetic chronic kidney disease: Secondary | ICD-10-CM | POA: Diagnosis not present

## 2015-12-09 DIAGNOSIS — I5043 Acute on chronic combined systolic (congestive) and diastolic (congestive) heart failure: Secondary | ICD-10-CM | POA: Diagnosis not present

## 2015-12-09 DIAGNOSIS — L97521 Non-pressure chronic ulcer of other part of left foot limited to breakdown of skin: Secondary | ICD-10-CM | POA: Diagnosis not present

## 2015-12-12 ENCOUNTER — Other Ambulatory Visit: Payer: Self-pay

## 2015-12-12 DIAGNOSIS — I1310 Hypertensive heart and chronic kidney disease without heart failure, with stage 1 through stage 4 chronic kidney disease, or unspecified chronic kidney disease: Secondary | ICD-10-CM | POA: Diagnosis not present

## 2015-12-12 DIAGNOSIS — E1122 Type 2 diabetes mellitus with diabetic chronic kidney disease: Secondary | ICD-10-CM | POA: Diagnosis not present

## 2015-12-12 DIAGNOSIS — L97521 Non-pressure chronic ulcer of other part of left foot limited to breakdown of skin: Secondary | ICD-10-CM | POA: Diagnosis not present

## 2015-12-12 DIAGNOSIS — E11621 Type 2 diabetes mellitus with foot ulcer: Secondary | ICD-10-CM | POA: Diagnosis not present

## 2015-12-12 DIAGNOSIS — I5043 Acute on chronic combined systolic (congestive) and diastolic (congestive) heart failure: Secondary | ICD-10-CM | POA: Diagnosis not present

## 2015-12-12 NOTE — Patient Outreach (Signed)
[facility]) Care Management  12/12/2015  MANSFIELD COSGROVE 08/02/1945 [id number]  70 year old with recent admission due to myocardial infarction and pneumonia. Member discharged from rehabilitation facility last week. RNCM called for transition of care. Member reports he is doing well. Member states he has all his mediations. Home health is involved for nursing physical therapy, occupatinal therapy and bath aide. Member reports he is also going to the wound care center.  Upcoming appointments discussed. Member has an appointment with primary care on tomorrow and also has an appointment with the wound care center this week for his left foot.  Plan: Home visit discussed with member, however due to member's schedule and the number of appointments with home health and providers, RNCM will follow up telephonically next week.  [name], RN, MSN, [place] Coordinator Cell: [phone]

## 2015-12-13 ENCOUNTER — Ambulatory Visit (INDEPENDENT_AMBULATORY_CARE_PROVIDER_SITE_OTHER): Payer: Commercial Managed Care - HMO | Admitting: Family Medicine

## 2015-12-13 ENCOUNTER — Encounter: Payer: Self-pay | Admitting: Family Medicine

## 2015-12-13 VITALS — BP 118/68 | HR 60 | Resp 12 | Ht 74.0 in | Wt 153.2 lb

## 2015-12-13 DIAGNOSIS — E11621 Type 2 diabetes mellitus with foot ulcer: Secondary | ICD-10-CM | POA: Diagnosis not present

## 2015-12-13 DIAGNOSIS — E1122 Type 2 diabetes mellitus with diabetic chronic kidney disease: Secondary | ICD-10-CM | POA: Diagnosis not present

## 2015-12-13 DIAGNOSIS — F320 Major depressive disorder, single episode, mild: Secondary | ICD-10-CM | POA: Diagnosis not present

## 2015-12-13 DIAGNOSIS — I1310 Hypertensive heart and chronic kidney disease without heart failure, with stage 1 through stage 4 chronic kidney disease, or unspecified chronic kidney disease: Secondary | ICD-10-CM | POA: Diagnosis not present

## 2015-12-13 DIAGNOSIS — N183 Chronic kidney disease, stage 3 unspecified: Secondary | ICD-10-CM

## 2015-12-13 DIAGNOSIS — B372 Candidiasis of skin and nail: Secondary | ICD-10-CM | POA: Diagnosis not present

## 2015-12-13 DIAGNOSIS — L97521 Non-pressure chronic ulcer of other part of left foot limited to breakdown of skin: Secondary | ICD-10-CM | POA: Diagnosis not present

## 2015-12-13 DIAGNOSIS — I5043 Acute on chronic combined systolic (congestive) and diastolic (congestive) heart failure: Secondary | ICD-10-CM | POA: Diagnosis not present

## 2015-12-13 DIAGNOSIS — R296 Repeated falls: Secondary | ICD-10-CM

## 2015-12-13 DIAGNOSIS — I951 Orthostatic hypotension: Secondary | ICD-10-CM | POA: Diagnosis not present

## 2015-12-13 MED ORDER — NYSTATIN-TRIAMCINOLONE 100000-0.1 UNIT/GM-% EX CREA: 1.0000 "application " | TOPICAL_CREAM | Freq: Two times a day (BID) | CUTANEOUS | 1 refills | Status: DC

## 2015-12-13 MED ORDER — NYSTATIN PO POWD: 1.0000 "application " | Freq: Three times a day (TID) | ORAL | 3 refills | Status: DC | PRN

## 2015-12-13 MED ORDER — DM-GUAIFENESIN ER 30-600 MG PO TB12: 0.5000 | ORAL_TABLET | Freq: Every day | ORAL | 0 refills | Status: DC

## 2015-12-13 NOTE — Progress Notes (Signed)
HPI:   [name] [name] is a 70 y.o. male, who is here today with his daughter and granddaughter to follow on some of his chronic medical problems. Last seen for acute problem , fever and some urinary symptoms, not longer having fever. He already followed with urologists, according to pt, PSA mildly elevated but monitoring for now. Hx of BPH, on Flomax, still with urinary frequency and urgency.  10/14/15 Remeron added because wife was concerned about decreased appetite, wt los, and mild depression. He is tolerating medications well. Hx of DM II, BS's in general < 200's, 140-150's. Missed appt with endocrinologists because he was in the hospital.  In general he feels like he is getting stronger, getting better. He denies any problem with sleep.  On 12/06/2015 HH called to report another fall as well as hypotension,Coreg was decreased from 3.125 mg twice daily to half tablet twice daily. He is still reporting lightheadedness when he first gets up in the morning, he has to sit for a few minutes then he has to stay up for a few minutes before he can start walking due to dizziness.  PT and OT a few times per week + [name] [name] visits. According to pt, O2 sats at RA 97-98%.  Denies severe/frequent headache, visual changes, chest pain, worsening dyspnea, palpitation, claudication, focal weakness, or worsening edema.  No orthopnea or PND, sleeping on a pillow.  Last fall was about a week ago, he was carrying clothes in one hand his walker with the other hand and trying to go down a step. He is also waiting a hard shoe on the left foot, he is currently under wound clinic care. According to daughter he is using his walker at home, he doesn't have it with him now because she wanted to try without it. Has had a total of 4 falls this month.   He is currently following with cardiologist, next appointment on 12/22/2015.  He has Hx of CAD s/p previous anterior MI in 1660, systolic HF EF  63%.   Hx of V tach on Amiodarone. On digoxin and  Aldactone 12.5 mg daily.   Lab Results  Component Value Date   TSH 1.785 07/26/2015    Lab Results  Component Value Date   HGBA1C 12.0 (H) 10/27/2015   + CKD III, last BMP at his cardiologist's office renal function slightly worse. According to patient, her diuretic was decreased.  Lab Results  Component Value Date   CREATININE 2.14 (H) 11/22/2015   BUN 36 (H) 11/22/2015   NA 140 11/22/2015   K 4.8 11/22/2015   CL 105 11/22/2015   CO2 28 11/22/2015     Rash:  Today during her mentions that he has a pruritic rash on lower abdomen, he states that he has had a for a few weeks. According to daughter, home health nurse recommended nystatin powder. He denies any prior history of similar rash. He is waiting until diapers for episodes of urine incontinence. He has not tried OTC medications for this problem.   Review of Systems  Constitutional: Positive for fatigue (improved). Negative for appetite change, chills, fever and unexpected weight change.  HENT: Negative for mouth sores, nosebleeds, sore throat and trouble swallowing.   Eyes: Negative for pain, redness and visual disturbance.  Respiratory: Negative for cough, shortness of breath and wheezing.   Cardiovascular: Positive for leg swelling. Negative for chest pain and palpitations.  Gastrointestinal: Negative for abdominal pain, nausea and vomiting.  No changes in bowel habits.  Genitourinary: Positive for frequency and urgency. Negative for decreased urine volume, dysuria and hematuria.  Musculoskeletal: Positive for arthralgias and gait problem. Negative for myalgias.  Skin: Positive for rash and wound (Left foot).  Neurological: Positive for light-headedness. Negative for seizures, syncope, facial asymmetry, weakness and headaches.  Psychiatric/Behavioral: Negative for confusion and sleep disturbance. The patient is not nervous/anxious.       Current  Outpatient Prescriptions on File Prior to Visit  Medication Sig Dispense Refill  . ACCU-CHEK AVIVA PLUS test strip     . amiodarone (PACERONE) 200 MG tablet Take 1 tablet (200 mg total) by mouth daily. 60 tablet 0  . aspirin EC 81 MG tablet Take 81 mg by mouth daily.    . ASSURE COMFORT LANCETS 30G MISC     . Blood Glucose Calibration (ACCU-CHEK AVIVA) SOLN     . Blood Glucose Monitoring Suppl (ACCU-CHEK AVIVA PLUS) w/Device KIT     . carvedilol (COREG) 3.125 MG tablet Take 1/2 tablet by mouth twice daily. 30 tablet 3  . clopidogrel (PLAVIX) 75 MG tablet Take 1 tablet (75 mg total) by mouth daily. 30 tablet 0  . collagenase (SANTYL) ointment Apply topically 2 (two) times daily. 15 g 0  . digoxin (LANOXIN) 0.125 MG tablet Take 0.125 mg by mouth daily.    . insulin aspart (NOVOLOG FLEXPEN) 100 UNIT/ML FlexPen Inject 5-15 Units into the skin 3 (three) times daily with meals.    . insulin detemir (LEVEMIR) 100 UNIT/ML injection Inject 20 Units into the skin at bedtime.    Marland Kitchen ipratropium-albuterol (DUONEB) 0.5-2.5 (3) MG/3ML SOLN Take 3 mLs by nebulization every 6 (six) hours as needed (Feeling of SOB). 360 mL 0  . Lancet Devices (ADJUSTABLE LANCING DEVICE) MISC     . mirtazapine (REMERON) 15 MG tablet TAKE 1 TABLET(15 MG) BY MOUTH AT BEDTIME 30 tablet 0  . multivitamin-iron-minerals-folic acid (THERAPEUTIC-M) TABS tablet Take 1 tablet by mouth daily.    . rosuvastatin (CRESTOR) 40 MG tablet TAKE 1 TABLET BY MOUTH DAILY 90 tablet 0  . spironolactone (ALDACTONE) 25 MG tablet Take 0.5 tablets (12.5 mg total) by mouth daily. 15 tablet 6  . SURE COMFORT PEN NEEDLES 31G X 8 MM MISC     . tamsulosin (FLOMAX) 0.4 MG CAPS capsule Take 1 capsule (0.4 mg total) by mouth daily. 30 capsule 6  . torsemide (DEMADEX) 20 MG tablet Take 1 tablet (20 mg total) by mouth daily. 60 tablet 6  . vitamin A 10000 UNIT capsule Take 10,000 Units by mouth daily.    . vitamin C (ASCORBIC ACID) 500 MG tablet Take 500 mg by  mouth daily.    Marland Kitchen zinc sulfate 220 (50 Zn) MG capsule Take 220 mg by mouth daily.     No current facility-administered medications on file prior to visit.      Past Medical History:  Diagnosis Date  . Automatic implantable cardioverter-defibrillator in situ   . CAD (coronary artery disease)    a. LHC (08/2005):  Ostial LAD 99%, mid LAD 50%, superior D1 80%, inferior D1 75%, mid OM proximal 80%, proximal PDA 25-30%, mid RCA 99%.   MRI with full thickness scar.  Med Rx.  2015 demonstrated similar diffuse three-vessel coronary disease. Perfusion imaging with rest we distribution demonstrated no viability in the area of the LAD and circumflex. Medical management.  . CAD, multiple vessel, RCA 100% mid occl.; LAD 99% stenosisprox.  mid of 50-60%; LCX OM-1 90%,  OM-2 99%,AV groove 80%  12/10/2013  . Chronic combined systolic and diastolic heart failure ([place])   . CKD (chronic kidney disease)   . Gout   . Hx of echocardiogram 2015   Echo (08/2013):  EF 10-15%, diff HK, mod LAE, severe RVE, mod reduced RVSF, mild RAE, PASP 41 mmHg.  Marland Kitchen Hypertension   . Ischemic cardiomyopathy    Echo (8/11):  EF 40-45%;  Echo (5/15):  EF 10-15%  . Myocardial infarction [facility]) 2007   out of hosp MI - LHC with 3v CAD rx medically  . NSVT (nonsustained ventricular tachycardia) (HCC)    s/p ICD  . Peripheral neuropathy ([place])   . Sleep apnea    "has CPAP; won't use it"  . Stroke [facility]) ~ 2013   "right leg weak since" (12/09/2013)  . Type II diabetes mellitus ([place]) dx'd 2005   No Known Allergies  Social History   Social History  . Marital status: Married    Spouse name: N/A  . Number of children: N/A  . Years of education: N/A   Social History Main Topics  . Smoking status: Never Smoker  . Smokeless tobacco: Never Used  . Alcohol use No  . Drug use: No  . Sexual activity: Not Asked   Other Topics Concern  . None   Social History Narrative   He is retired Veterinary surgeon for [employer].  He took  early retirement at 70 years old due to medical reasons.   He lives with wife.   Highest level of education:  High school       Vitals:   12/13/15 1101  BP: 118/68  Pulse: 60  Resp: 12   Body mass index is 19.68 kg/m.   Wt Readings from Last 3 Encounters:  12/13/15 153 lb 4 oz (69.5 kg)  11/28/15 150 lb 6 oz (68.2 kg)  11/22/15 154 lb (69.9 kg)      Physical Exam  Constitutional: He is oriented to person, place, and time. He appears well-developed. No distress.  HENT:  Head: Atraumatic.  Mouth/Throat: Oropharynx is clear and moist and mucous membranes are normal.  Eyes: Conjunctivae and EOM are normal.  Neck: No JVD present.  Cardiovascular: Normal rate and regular rhythm.   Murmur (II/VI LUSB>RUSB) heard. Pulses:      Dorsalis pedis pulses are 2+ on the right side, and 2+ on the left side.  Respiratory: Effort normal and breath sounds normal. No respiratory distress.  GI: Soft. He exhibits no mass. There is no tenderness. A hernia is present. Hernia confirmed positive in the left inguinal area (No tender and easy to reduce).  Musculoskeletal: He exhibits edema (1+ pitting edema LE bilateral). He exhibits no tenderness.  Lymphadenopathy:    He has no cervical adenopathy.  Neurological: He is alert and oriented to person, place, and time. He has normal strength. Coordination normal.  Slow gait, otherwise stable, trips occasionally due to hard shoe  Skin: Skin is warm. Abrasion (anterior distal pretibila area LLE.) and rash noted. There is erythema.     Erythematosus rash lower abdomen, nontender local heat. About 1 cm, rounded superficial excoriation, distal pretibial area of left lower extremity. No surrounding erythema or local heat, no drainage.  Psychiatric: He has a normal mood and affect.  Well groomed, good eye contact.      ASSESSMENT AND PLAN:     Somnang was seen today for follow-up.  Diagnoses and all orders for this visit:  Intertriginous  candidiasis  Etiology and risk factors for problem discussed. Topical medication recommended. Try to keep area dry and clean. Follow-up as needed.  -     nystatin-triamcinolone (MYCOLOG II) cream; Apply 1 application topically 2 (two) times daily. -     Nystatin POWD; Take 1 application by mouth 3 (three) times daily as needed.  Orthostatic hypotension  BP when laying down 110/65 pulse 60/m, sitting down 96/65 and pulse 68/m. Coreg decrease from twice daily to once daily, consider any medication IF blood pressure is still on lower range or less in the 100/60. No changes in rest of his medications. He will keep appointment with cardiologist 12/12/2015.  Frequent falls  Long discussion about fall prevention, we identified some risk factors for falls. Continue PT and OT. Instructed using his walker all the time.  Depression, major, single episode, mild ([place])  He seems stable, he is eating better and has gained some weight. We reviewed some of the side effects. No changes in management. Follow-up in 4 months.  CKD (chronic kidney disease) stage 3, GFR 30-59 ml/min  He would prefer to wait until he sees cardiologists, who most likely is going to recheck renal function after changing dose of diuretics. Low salt diet. F/U in 4 months.    -He will keep appointment with urologist and cardiologist. -She will reschedule appointment with endocrinologist.      -[name] and daughter were advised to return sooner than planned today if new concerns arise.       Korina Tretter G. [name], MD  [facility] [place] office.

## 2015-12-13 NOTE — Progress Notes (Signed)
Pre visit review using our clinic review tool, if applicable. No additional management support is needed unless otherwise documented below in the visit note. 

## 2015-12-13 NOTE — Patient Instructions (Addendum)
A few things to remember from today's visit:   Intertriginous candidiasis - Plan: nystatin-triamcinolone (MYCOLOG II) cream, Nystatin POWD  Orthostatic hypotension  Frequent falls  Depression, major, single episode, mild (HCC)  CKD (chronic kidney disease) stage 3, GFR 30-59 ml/min  Today Coreg decreased.  Fall precautions as discussed.   Please be sure medication list is accurate. If a new problem present, please set up appointment sooner than planned today.

## 2015-12-14 DIAGNOSIS — L89892 Pressure ulcer of other site, stage 2: Secondary | ICD-10-CM | POA: Diagnosis not present

## 2015-12-14 DIAGNOSIS — L97821 Non-pressure chronic ulcer of other part of left lower leg limited to breakdown of skin: Secondary | ICD-10-CM | POA: Diagnosis not present

## 2015-12-14 DIAGNOSIS — I2510 Atherosclerotic heart disease of native coronary artery without angina pectoris: Secondary | ICD-10-CM | POA: Diagnosis not present

## 2015-12-14 DIAGNOSIS — G4730 Sleep apnea, unspecified: Secondary | ICD-10-CM | POA: Diagnosis not present

## 2015-12-14 DIAGNOSIS — I5040 Unspecified combined systolic (congestive) and diastolic (congestive) heart failure: Secondary | ICD-10-CM | POA: Diagnosis not present

## 2015-12-14 DIAGNOSIS — L97522 Non-pressure chronic ulcer of other part of left foot with fat layer exposed: Secondary | ICD-10-CM | POA: Diagnosis not present

## 2015-12-14 DIAGNOSIS — I252 Old myocardial infarction: Secondary | ICD-10-CM | POA: Diagnosis not present

## 2015-12-14 DIAGNOSIS — I130 Hypertensive heart and chronic kidney disease with heart failure and stage 1 through stage 4 chronic kidney disease, or unspecified chronic kidney disease: Secondary | ICD-10-CM | POA: Diagnosis not present

## 2015-12-14 DIAGNOSIS — I5031 Acute diastolic (congestive) heart failure: Secondary | ICD-10-CM | POA: Diagnosis not present

## 2015-12-14 DIAGNOSIS — E1140 Type 2 diabetes mellitus with diabetic neuropathy, unspecified: Secondary | ICD-10-CM | POA: Diagnosis not present

## 2015-12-14 DIAGNOSIS — E1122 Type 2 diabetes mellitus with diabetic chronic kidney disease: Secondary | ICD-10-CM | POA: Diagnosis not present

## 2015-12-14 DIAGNOSIS — I25119 Atherosclerotic heart disease of native coronary artery with unspecified angina pectoris: Secondary | ICD-10-CM | POA: Diagnosis not present

## 2015-12-14 DIAGNOSIS — E11621 Type 2 diabetes mellitus with foot ulcer: Secondary | ICD-10-CM | POA: Diagnosis not present

## 2015-12-15 DIAGNOSIS — E11621 Type 2 diabetes mellitus with foot ulcer: Secondary | ICD-10-CM | POA: Diagnosis not present

## 2015-12-15 DIAGNOSIS — E1122 Type 2 diabetes mellitus with diabetic chronic kidney disease: Secondary | ICD-10-CM | POA: Diagnosis not present

## 2015-12-15 DIAGNOSIS — I5043 Acute on chronic combined systolic (congestive) and diastolic (congestive) heart failure: Secondary | ICD-10-CM | POA: Diagnosis not present

## 2015-12-15 DIAGNOSIS — L97521 Non-pressure chronic ulcer of other part of left foot limited to breakdown of skin: Secondary | ICD-10-CM | POA: Diagnosis not present

## 2015-12-15 DIAGNOSIS — I1310 Hypertensive heart and chronic kidney disease without heart failure, with stage 1 through stage 4 chronic kidney disease, or unspecified chronic kidney disease: Secondary | ICD-10-CM | POA: Diagnosis not present

## 2015-12-16 ENCOUNTER — Encounter (HOSPITAL_COMMUNITY): Payer: Self-pay

## 2015-12-16 ENCOUNTER — Telehealth: Payer: Self-pay | Admitting: Family Medicine

## 2015-12-16 ENCOUNTER — Emergency Department (HOSPITAL_COMMUNITY): Payer: Commercial Managed Care - HMO

## 2015-12-16 ENCOUNTER — Inpatient Hospital Stay (HOSPITAL_COMMUNITY)
Admission: EM | Admit: 2015-12-16 | Discharge: 2015-12-22 | DRG: 469 | Disposition: A | Discharge status: Skilled Nursing Facility | Payer: Commercial Managed Care - HMO | Attending: Internal Medicine | Admitting: Internal Medicine

## 2015-12-16 DIAGNOSIS — Z7902 Long term (current) use of antithrombotics/antiplatelets: Secondary | ICD-10-CM

## 2015-12-16 DIAGNOSIS — I2510 Atherosclerotic heart disease of native coronary artery without angina pectoris: Secondary | ICD-10-CM

## 2015-12-16 DIAGNOSIS — E11621 Type 2 diabetes mellitus with foot ulcer: Secondary | ICD-10-CM | POA: Diagnosis present

## 2015-12-16 DIAGNOSIS — I130 Hypertensive heart and chronic kidney disease with heart failure and stage 1 through stage 4 chronic kidney disease, or unspecified chronic kidney disease: Secondary | ICD-10-CM | POA: Diagnosis present

## 2015-12-16 DIAGNOSIS — I255 Ischemic cardiomyopathy: Secondary | ICD-10-CM | POA: Diagnosis present

## 2015-12-16 DIAGNOSIS — L89151 Pressure ulcer of sacral region, stage 1: Secondary | ICD-10-CM | POA: Diagnosis present

## 2015-12-16 DIAGNOSIS — E43 Unspecified severe protein-calorie malnutrition: Secondary | ICD-10-CM | POA: Diagnosis not present

## 2015-12-16 DIAGNOSIS — R262 Difficulty in walking, not elsewhere classified: Secondary | ICD-10-CM | POA: Diagnosis not present

## 2015-12-16 DIAGNOSIS — M21372 Foot drop, left foot: Secondary | ICD-10-CM | POA: Diagnosis present

## 2015-12-16 DIAGNOSIS — M25551 Pain in right hip: Secondary | ICD-10-CM | POA: Diagnosis present

## 2015-12-16 DIAGNOSIS — N183 Chronic kidney disease, stage 3 unspecified: Secondary | ICD-10-CM | POA: Diagnosis present

## 2015-12-16 DIAGNOSIS — I5042 Chronic combined systolic (congestive) and diastolic (congestive) heart failure: Secondary | ICD-10-CM | POA: Diagnosis not present

## 2015-12-16 DIAGNOSIS — Z23 Encounter for immunization: Secondary | ICD-10-CM

## 2015-12-16 DIAGNOSIS — I5022 Chronic systolic (congestive) heart failure: Secondary | ICD-10-CM | POA: Diagnosis not present

## 2015-12-16 DIAGNOSIS — Z794 Long term (current) use of insulin: Secondary | ICD-10-CM

## 2015-12-16 DIAGNOSIS — Z955 Presence of coronary angioplasty implant and graft: Secondary | ICD-10-CM

## 2015-12-16 DIAGNOSIS — Z66 Do not resuscitate: Secondary | ICD-10-CM | POA: Diagnosis not present

## 2015-12-16 DIAGNOSIS — E785 Hyperlipidemia, unspecified: Secondary | ICD-10-CM | POA: Diagnosis present

## 2015-12-16 DIAGNOSIS — I69351 Hemiplegia and hemiparesis following cerebral infarction affecting right dominant side: Secondary | ICD-10-CM

## 2015-12-16 DIAGNOSIS — J9690 Respiratory failure, unspecified, unspecified whether with hypoxia or hypercapnia: Secondary | ICD-10-CM | POA: Diagnosis not present

## 2015-12-16 DIAGNOSIS — S72001D Fracture of unspecified part of neck of right femur, subsequent encounter for closed fracture with routine healing: Secondary | ICD-10-CM | POA: Diagnosis not present

## 2015-12-16 DIAGNOSIS — T500X5A Adverse effect of mineralocorticoids and their antagonists, initial encounter: Secondary | ICD-10-CM | POA: Diagnosis present

## 2015-12-16 DIAGNOSIS — F320 Major depressive disorder, single episode, mild: Secondary | ICD-10-CM | POA: Diagnosis present

## 2015-12-16 DIAGNOSIS — I252 Old myocardial infarction: Secondary | ICD-10-CM

## 2015-12-16 DIAGNOSIS — E1165 Type 2 diabetes mellitus with hyperglycemia: Secondary | ICD-10-CM

## 2015-12-16 DIAGNOSIS — R569 Unspecified convulsions: Secondary | ICD-10-CM | POA: Diagnosis not present

## 2015-12-16 DIAGNOSIS — S0081XA Abrasion of other part of head, initial encounter: Secondary | ICD-10-CM | POA: Diagnosis present

## 2015-12-16 DIAGNOSIS — G4089 Other seizures: Secondary | ICD-10-CM | POA: Diagnosis not present

## 2015-12-16 DIAGNOSIS — Z95810 Presence of automatic (implantable) cardiac defibrillator: Secondary | ICD-10-CM

## 2015-12-16 DIAGNOSIS — N189 Chronic kidney disease, unspecified: Secondary | ICD-10-CM | POA: Diagnosis not present

## 2015-12-16 DIAGNOSIS — R41841 Cognitive communication deficit: Secondary | ICD-10-CM | POA: Diagnosis not present

## 2015-12-16 DIAGNOSIS — E1122 Type 2 diabetes mellitus with diabetic chronic kidney disease: Secondary | ICD-10-CM | POA: Diagnosis not present

## 2015-12-16 DIAGNOSIS — L8990 Pressure ulcer of unspecified site, unspecified stage: Secondary | ICD-10-CM | POA: Insufficient documentation

## 2015-12-16 DIAGNOSIS — Z7982 Long term (current) use of aspirin: Secondary | ICD-10-CM

## 2015-12-16 DIAGNOSIS — R278 Other lack of coordination: Secondary | ICD-10-CM | POA: Diagnosis not present

## 2015-12-16 DIAGNOSIS — I472 Ventricular tachycardia: Secondary | ICD-10-CM

## 2015-12-16 DIAGNOSIS — Z681 Body mass index (BMI) 19 or less, adult: Secondary | ICD-10-CM

## 2015-12-16 DIAGNOSIS — I509 Heart failure, unspecified: Secondary | ICD-10-CM | POA: Diagnosis not present

## 2015-12-16 DIAGNOSIS — I270 Primary pulmonary hypertension: Secondary | ICD-10-CM | POA: Diagnosis not present

## 2015-12-16 DIAGNOSIS — D62 Acute posthemorrhagic anemia: Secondary | ICD-10-CM | POA: Diagnosis not present

## 2015-12-16 DIAGNOSIS — E875 Hyperkalemia: Secondary | ICD-10-CM | POA: Diagnosis present

## 2015-12-16 DIAGNOSIS — L97529 Non-pressure chronic ulcer of other part of left foot with unspecified severity: Secondary | ICD-10-CM | POA: Diagnosis present

## 2015-12-16 DIAGNOSIS — S299XXA Unspecified injury of thorax, initial encounter: Secondary | ICD-10-CM | POA: Diagnosis not present

## 2015-12-16 DIAGNOSIS — S0990XA Unspecified injury of head, initial encounter: Secondary | ICD-10-CM | POA: Diagnosis not present

## 2015-12-16 DIAGNOSIS — Z882 Allergy status to sulfonamides status: Secondary | ICD-10-CM

## 2015-12-16 DIAGNOSIS — E119 Type 2 diabetes mellitus without complications: Secondary | ICD-10-CM | POA: Diagnosis not present

## 2015-12-16 DIAGNOSIS — M109 Gout, unspecified: Secondary | ICD-10-CM | POA: Diagnosis not present

## 2015-12-16 DIAGNOSIS — E1121 Type 2 diabetes mellitus with diabetic nephropathy: Secondary | ICD-10-CM | POA: Diagnosis present

## 2015-12-16 DIAGNOSIS — M6281 Muscle weakness (generalized): Secondary | ICD-10-CM | POA: Diagnosis not present

## 2015-12-16 DIAGNOSIS — Z4789 Encounter for other orthopedic aftercare: Secondary | ICD-10-CM | POA: Diagnosis not present

## 2015-12-16 DIAGNOSIS — G8190 Hemiplegia, unspecified affecting unspecified side: Secondary | ICD-10-CM | POA: Diagnosis not present

## 2015-12-16 DIAGNOSIS — S72009A Fracture of unspecified part of neck of unspecified femur, initial encounter for closed fracture: Secondary | ICD-10-CM | POA: Diagnosis not present

## 2015-12-16 DIAGNOSIS — S72001A Fracture of unspecified part of neck of right femur, initial encounter for closed fracture: Principal | ICD-10-CM

## 2015-12-16 DIAGNOSIS — D631 Anemia in chronic kidney disease: Secondary | ICD-10-CM | POA: Diagnosis not present

## 2015-12-16 DIAGNOSIS — I639 Cerebral infarction, unspecified: Secondary | ICD-10-CM | POA: Diagnosis not present

## 2015-12-16 DIAGNOSIS — R4181 Age-related cognitive decline: Secondary | ICD-10-CM | POA: Diagnosis not present

## 2015-12-16 DIAGNOSIS — I10 Essential (primary) hypertension: Secondary | ICD-10-CM | POA: Diagnosis not present

## 2015-12-16 LAB — CBC WITH DIFFERENTIAL/PLATELET
BASOS PCT: 0 %
Basophils Absolute: 0 10*3/uL (ref 0.0–0.1)
EOS ABS: 0.1 10*3/uL (ref 0.0–0.7)
Eosinophils Relative: 1 %
HEMATOCRIT: 36.2 % — AB (ref 39.0–52.0)
HEMOGLOBIN: 11 g/dL — AB (ref 13.0–17.0)
LYMPHS ABS: 0.9 10*3/uL (ref 0.7–4.0)
Lymphocytes Relative: 7 %
MCH: 27.8 pg (ref 26.0–34.0)
MCHC: 30.4 g/dL (ref 30.0–36.0)
MCV: 91.6 fL (ref 78.0–100.0)
MONOS PCT: 7 %
Monocytes Absolute: 0.9 10*3/uL (ref 0.1–1.0)
NEUTROS ABS: 11.4 10*3/uL — AB (ref 1.7–7.7)
NEUTROS PCT: 85 %
Platelets: 168 10*3/uL (ref 150–400)
RBC: 3.95 MIL/uL — AB (ref 4.22–5.81)
RDW: 16.1 % — ABNORMAL HIGH (ref 11.5–15.5)
WBC: 13.4 10*3/uL — AB (ref 4.0–10.5)

## 2015-12-16 LAB — COMPREHENSIVE METABOLIC PANEL
ALBUMIN: 3 g/dL — AB (ref 3.5–5.0)
ALK PHOS: 146 U/L — AB (ref 38–126)
ALT: 61 U/L (ref 17–63)
AST: 67 U/L — ABNORMAL HIGH (ref 15–41)
Anion gap: 5 (ref 5–15)
BILIRUBIN TOTAL: 0.7 mg/dL (ref 0.3–1.2)
BUN: 31 mg/dL — AB (ref 6–20)
CALCIUM: 8.8 mg/dL — AB (ref 8.9–10.3)
CO2: 24 mmol/L (ref 22–32)
CREATININE: 1.83 mg/dL — AB (ref 0.61–1.24)
Chloride: 113 mmol/L — ABNORMAL HIGH (ref 101–111)
GFR calc Af Amer: 42 mL/min — ABNORMAL LOW (ref >60)
GFR calc non Af Amer: 36 mL/min — ABNORMAL LOW (ref >60)
GLUCOSE: 220 mg/dL — AB (ref 65–99)
Potassium: 5.3 mmol/L — ABNORMAL HIGH (ref 3.5–5.1)
SODIUM: 142 mmol/L (ref 135–145)
TOTAL PROTEIN: 7.4 g/dL (ref 6.5–8.1)

## 2015-12-16 LAB — TYPE AND SCREEN
ABO/RH(D): O POS
ANTIBODY SCREEN: NEGATIVE

## 2015-12-16 LAB — ABO/RH: ABO/RH(D): O POS

## 2015-12-16 LAB — GLUCOSE, CAPILLARY: Glucose-Capillary: 192 mg/dL — ABNORMAL HIGH (ref 65–99)

## 2015-12-16 MED ORDER — INSULIN DETEMIR 100 UNIT/ML ~~LOC~~ SOLN
10.0000 [IU] | Freq: Every day | SUBCUTANEOUS | Status: DC
  Administered 2015-12-16 – 2015-12-17 (×2): 10 [IU] via SUBCUTANEOUS
  Filled 2015-12-16 (×3): qty 0.1

## 2015-12-16 MED ORDER — TETANUS-DIPHTH-ACELL PERTUSSIS 5-2.5-18.5 LF-MCG/0.5 IM SUSP
0.5000 mL | Freq: Once | INTRAMUSCULAR | Status: DC
  Filled 2015-12-16: qty 0.5

## 2015-12-16 MED ORDER — TAMSULOSIN HCL 0.4 MG PO CAPS
0.4000 mg | ORAL_CAPSULE | Freq: Every day | ORAL | Status: DC
  Administered 2015-12-16 – 2015-12-22 (×6): 0.4 mg via ORAL
  Filled 2015-12-16 (×6): qty 1

## 2015-12-16 MED ORDER — METHOCARBAMOL 500 MG PO TABS
500.0000 mg | ORAL_TABLET | Freq: Four times a day (QID) | ORAL | Status: DC | PRN
  Administered 2015-12-16 – 2015-12-21 (×3): 500 mg via ORAL
  Filled 2015-12-16 (×4): qty 1

## 2015-12-16 MED ORDER — AMIODARONE HCL 200 MG PO TABS
200.0000 mg | ORAL_TABLET | Freq: Every day | ORAL | Status: DC
  Administered 2015-12-16 – 2015-12-22 (×6): 200 mg via ORAL
  Filled 2015-12-16: qty 1
  Filled 2015-12-16 (×2): qty 2
  Filled 2015-12-16 (×3): qty 1

## 2015-12-16 MED ORDER — MORPHINE SULFATE (PF) 2 MG/ML IV SOLN: 0.5000 mg | INTRAVENOUS | Status: DC | PRN

## 2015-12-16 MED ORDER — POLYETHYLENE GLYCOL 3350 17 G PO PACK: 17.0000 g | PACK | Freq: Every day | ORAL | Status: DC | PRN

## 2015-12-16 MED ORDER — FENTANYL CITRATE (PF) 100 MCG/2ML IJ SOLN
50.0000 ug | Freq: Once | INTRAMUSCULAR | Status: AC
  Administered 2015-12-16: 50 ug via INTRAVENOUS
  Filled 2015-12-16: qty 2

## 2015-12-16 MED ORDER — MIRTAZAPINE 15 MG PO TABS
15.0000 mg | ORAL_TABLET | Freq: Every day | ORAL | Status: DC
  Administered 2015-12-17 – 2015-12-21 (×5): 15 mg via ORAL
  Filled 2015-12-16 (×6): qty 1

## 2015-12-16 MED ORDER — ROSUVASTATIN CALCIUM 40 MG PO TABS
40.0000 mg | ORAL_TABLET | Freq: Every day | ORAL | Status: DC
  Administered 2015-12-16 – 2015-12-22 (×6): 40 mg via ORAL
  Filled 2015-12-16 (×3): qty 1
  Filled 2015-12-16 (×2): qty 4
  Filled 2015-12-16: qty 1

## 2015-12-16 MED ORDER — CLOPIDOGREL BISULFATE 75 MG PO TABS
75.0000 mg | ORAL_TABLET | Freq: Every day | ORAL | Status: DC
  Administered 2015-12-17 – 2015-12-22 (×5): 75 mg via ORAL
  Filled 2015-12-16 (×5): qty 1

## 2015-12-16 MED ORDER — BISACODYL 10 MG RE SUPP: 10.0000 mg | Freq: Every day | RECTAL | Status: DC | PRN

## 2015-12-16 MED ORDER — HYDROCODONE-ACETAMINOPHEN 5-325 MG PO TABS
1.0000 | ORAL_TABLET | Freq: Four times a day (QID) | ORAL | Status: DC | PRN
  Administered 2015-12-16 – 2015-12-21 (×6): 2 via ORAL
  Filled 2015-12-16 (×7): qty 2

## 2015-12-16 MED ORDER — DIGOXIN 125 MCG PO TABS
0.1250 mg | ORAL_TABLET | Freq: Every day | ORAL | Status: DC
  Administered 2015-12-16 – 2015-12-22 (×6): 0.125 mg via ORAL
  Filled 2015-12-16 (×6): qty 1

## 2015-12-16 MED ORDER — ASPIRIN EC 81 MG PO TBEC
81.0000 mg | DELAYED_RELEASE_TABLET | Freq: Every day | ORAL | Status: DC
  Administered 2015-12-17 – 2015-12-22 (×5): 81 mg via ORAL
  Filled 2015-12-16 (×5): qty 1

## 2015-12-16 MED ORDER — DEXTROSE 5 % IV SOLN
500.0000 mg | Freq: Four times a day (QID) | INTRAVENOUS | Status: DC | PRN
  Filled 2015-12-16: qty 5

## 2015-12-16 MED ORDER — HEPARIN SODIUM (PORCINE) 5000 UNIT/ML IJ SOLN
5000.0000 [IU] | Freq: Three times a day (TID) | INTRAMUSCULAR | Status: DC
  Administered 2015-12-16 – 2015-12-17 (×4): 5000 [IU] via SUBCUTANEOUS
  Filled 2015-12-16 (×5): qty 1

## 2015-12-16 MED ORDER — SENNA 8.6 MG PO TABS
1.0000 | ORAL_TABLET | Freq: Two times a day (BID) | ORAL | Status: DC
  Administered 2015-12-16 – 2015-12-22 (×11): 8.6 mg via ORAL
  Filled 2015-12-16 (×11): qty 1

## 2015-12-16 MED ORDER — COLLAGENASE 250 UNIT/GM EX OINT
TOPICAL_OINTMENT | Freq: Two times a day (BID) | CUTANEOUS | Status: DC
  Administered 2015-12-16 – 2015-12-19 (×6): via TOPICAL
  Administered 2015-12-20: 1 via TOPICAL
  Administered 2015-12-20 – 2015-12-22 (×4): via TOPICAL
  Filled 2015-12-16 (×2): qty 30

## 2015-12-16 MED ORDER — CARVEDILOL 3.125 MG PO TABS
1.5625 mg | ORAL_TABLET | Freq: Two times a day (BID) | ORAL | Status: DC
  Administered 2015-12-16 – 2015-12-22 (×12): 1.5625 mg via ORAL
  Filled 2015-12-16 (×13): qty 1

## 2015-12-16 MED ORDER — INSULIN ASPART 100 UNIT/ML ~~LOC~~ SOLN
0.0000 [IU] | Freq: Three times a day (TID) | SUBCUTANEOUS | Status: DC
  Administered 2015-12-17: 2 [IU] via SUBCUTANEOUS
  Administered 2015-12-18: 1 [IU] via SUBCUTANEOUS
  Administered 2015-12-19: 3 [IU] via SUBCUTANEOUS
  Administered 2015-12-19 – 2015-12-20 (×2): 7 [IU] via SUBCUTANEOUS
  Administered 2015-12-20: 2 [IU] via SUBCUTANEOUS
  Administered 2015-12-20: 5 [IU] via SUBCUTANEOUS
  Administered 2015-12-21: 2 [IU] via SUBCUTANEOUS
  Administered 2015-12-21: 7 [IU] via SUBCUTANEOUS
  Administered 2015-12-21: 1 [IU] via SUBCUTANEOUS
  Administered 2015-12-22: 3 [IU] via SUBCUTANEOUS

## 2015-12-16 MED ORDER — DOCUSATE SODIUM 100 MG PO CAPS
100.0000 mg | ORAL_CAPSULE | Freq: Two times a day (BID) | ORAL | Status: DC
  Administered 2015-12-16 – 2015-12-22 (×11): 100 mg via ORAL
  Filled 2015-12-16 (×11): qty 1

## 2015-12-16 NOTE — Telephone Encounter (Signed)
Okay for orders? I don't see any orders that were faxed on him.

## 2015-12-16 NOTE — Telephone Encounter (Signed)
Leonor Liv received a call from caregiver pt had a fall in route to [place] today.
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
## 2015-12-16 NOTE — Telephone Encounter (Signed)
Timmothy Sours with [facility]mings OT plan of care: Evaluated on 8/7 1 X wk for 4 wks For ADL transfers exercise, fall prevention health promotion and infection control and ok to dc when goals or maximum potential  met . Orders should have been faxed.

## 2015-12-16 NOTE — ED Notes (Signed)
Admitting MD at bedside.

## 2015-12-16 NOTE — Telephone Encounter (Signed)
Ok to give authorization for PT recommendations. Thanks, BJ

## 2015-12-16 NOTE — Telephone Encounter (Signed)
See below
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
## 2015-12-16 NOTE — Telephone Encounter (Signed)
Left voicemail for [name] [name] letting him know that orders were okay.
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
## 2015-12-16 NOTE — ED Triage Notes (Addendum)
Pt presents for evaluation of R hip injury following fall last night. Pt. Had mechanical fall trying to get in door last night. Pt. Taking plavix, recent MI/CVA. Pt. AXO x4. Pt. Also hit R side of head. Pt. Unable to bear weight/ambulate on R leg. Pt has outward rotation of R leg.
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
## 2015-12-17 ENCOUNTER — Other Ambulatory Visit: Payer: Self-pay | Admitting: Orthopedic Surgery

## 2015-12-17 DIAGNOSIS — L8990 Pressure ulcer of unspecified site, unspecified stage: Secondary | ICD-10-CM

## 2015-12-17 DIAGNOSIS — E43 Unspecified severe protein-calorie malnutrition: Secondary | ICD-10-CM | POA: Insufficient documentation

## 2015-12-17 LAB — BASIC METABOLIC PANEL
Anion gap: 6 (ref 5–15)
BUN: 27 mg/dL — AB (ref 6–20)
CALCIUM: 8.4 mg/dL — AB (ref 8.9–10.3)
CHLORIDE: 109 mmol/L (ref 101–111)
CO2: 27 mmol/L (ref 22–32)
CREATININE: 1.74 mg/dL — AB (ref 0.61–1.24)
GFR calc non Af Amer: 38 mL/min — ABNORMAL LOW (ref >60)
GFR, EST AFRICAN AMERICAN: 44 mL/min — AB (ref >60)
Glucose, Bld: 101 mg/dL — ABNORMAL HIGH (ref 65–99)
Potassium: 4.8 mmol/L (ref 3.5–5.1)
SODIUM: 142 mmol/L (ref 135–145)

## 2015-12-17 LAB — CBC
HCT: 32.3 % — ABNORMAL LOW (ref 39.0–52.0)
HEMOGLOBIN: 9.7 g/dL — AB (ref 13.0–17.0)
MCH: 28 pg (ref 26.0–34.0)
MCHC: 30 g/dL (ref 30.0–36.0)
MCV: 93.4 fL (ref 78.0–100.0)
PLATELETS: 153 10*3/uL (ref 150–400)
RBC: 3.46 MIL/uL — AB (ref 4.22–5.81)
RDW: 16.3 % — ABNORMAL HIGH (ref 11.5–15.5)
WBC: 9.6 10*3/uL (ref 4.0–10.5)

## 2015-12-17 LAB — GLUCOSE, CAPILLARY
GLUCOSE-CAPILLARY: 139 mg/dL — AB (ref 65–99)
GLUCOSE-CAPILLARY: 200 mg/dL — AB (ref 65–99)
Glucose-Capillary: 109 mg/dL — ABNORMAL HIGH (ref 65–99)
Glucose-Capillary: 159 mg/dL — ABNORMAL HIGH (ref 65–99)

## 2015-12-17 MED ORDER — SPIRONOLACTONE 25 MG PO TABS
12.5000 mg | ORAL_TABLET | Freq: Every day | ORAL | Status: DC
  Administered 2015-12-17: 12.5 mg via ORAL
  Filled 2015-12-17: qty 1

## 2015-12-17 MED ORDER — TORSEMIDE 20 MG PO TABS
20.0000 mg | ORAL_TABLET | Freq: Every day | ORAL | Status: DC
  Administered 2015-12-17: 20 mg via ORAL
  Filled 2015-12-17: qty 1

## 2015-12-17 NOTE — Progress Notes (Addendum)
PROGRESS NOTE  [name] [name]  [id number] DOB: 1945-11-01 DOA: 12/16/2015 PCP: [name], MD  Brief Narrative:   [name] [name] is a 70 y.o. male with CAD, V-tach s/p PPM-ICD, ischemic cardiomyopathy with EF 15-20%, stroke, diabetes mellitus type 2, hypertension, gout, and CKD stage III who presents with a fall and hip pain.  He was hospitalized from 10/27/2015 through 11/06/2015.  He presented with confusion and vomiting.  In the ER, he had VT and his AICD fired.  He was admitted to the ICU and was found to have an NSTEMI (trop > 65), aspiration pneumonia, and Klebsiella UTI.  He underwent left heart catheterization which showed multivessel disease with a 90% stenosis of the prox LAD to mid LAD, totally occluded RCA, and a 90% stenosed distal circ.  He had a DES placed in the distal Cx on 7/11.  His echocardiogram demonstrated an EF of 15-20% with akinesis of the apical myocardium and significant LV dilation.  He was discharged to SNF where he stayed for 20 days and was discharged to home.   On the day of admission, he had a mechanical fall while trying to open a door. He was found to have a right femoral neck fracture.  Cardiology and orthopedics have been consulted.  He is moderate to high risk for surgery and cannot stop his aspirin or plavix.  Patient would like to pursue surgery.   Assessment & Plan:   Active Problems:   Essential hypertension   Chronic combined systolic and diastolic heart failure (HCC)   Type 2 diabetes mellitus with diabetic nephropathy (HCC)   Chronic renal insufficiency, stage III (moderate)   CAD, multiple vessel, RCA 100% mid occl.; LAD 99% stenosisprox.  mid of 50-60%; LCX OM-1 90%, OM-2 99%,AV groove 80%    Fracture, hip (HCC)   Pressure ulcer   Protein-calorie malnutrition, severe  Right femoral neck fracture -  Orthopedic surgery assistance appreciated.  Ongoing conversations between patient and orthopedic surgery regarding the risks of surgery.  -   Bedrest and pain control  Multivessel CAD s/p NSTEMI with peak Troponin >65 s/p DES to distal Cx on 11/01/2015 -  Continue aspirin, plavix, low dose beta blocker, and statin -  Cardiology consult appreciated -  Patient has been chest free -  NTG prn  Ischemic Cardiomyopathy w/ Systolic and Diastolic CHF, followed by Dr. Haroldine Laws - 7/8 echocardiogram EF 0000000, and diastolic CHF see results below - continue BB, digoxin - resume torsemide and spironolactone - not on ACEI/ARB due to CKD  Ventricular Tachycardia - IACD Fired in ED 7/6 -Amiodarone 200 mg BID  Probable Partial Seizure with left arm shaking.   -  F/u with Neurology on 8/31. -Not on seizure medication   H/O CVA - Mild persistent right-sided weakness   CKD III (baseline Cr 1.7) -  Minimize nephrotoxins and renally dose medications   Mild hyperkalemia, asymptomatic, likely related to spironolactone use in setting of CKD -  Hold spironolactone preoperatively  DM Type 2 uncontrolled 7/6 Hemoglobin A1c 12.0 -Lantus 10 units daily (half of home dose) - low dose SSI  Left foot diabetic ulcer -Left plantar surface ulcer lateral aspect. Santyl ointment BID  Multiple abrasions:  Apply vaseline and cover with dry bandage  Leukocytosis, likely related to fall -  No pneumonia on CXR -  Check UA  Normocytic anemia, likely due to CKD, hemoglobin at baseline of 10-11mg /dl.  Occult negative.  Stage 1 pressure ulcer on sacrum, present at time of  admission  Severe protein calorie malnutrition -  Supplements  -  Liberalize diet  DVT prophylaxis: heparin and SCDs Code Status: full Family Communication: patient, his wife and daughter  Disposition Plan:  5-7 days  Consultants:   Orthopedic surgery, Dr. Ronnie Derby  [facility]  Procedures:  none  Antimicrobials:   none    Subjective: Would like to have surgery.  Understands he may have a heart attack and die.  Does not want to be wheelchair and  bedbound.  Mild pain in the right leg.    Objective: Vitals:   12/16/15 1700 12/16/15 1753 12/16/15 2002 12/17/15 1300  BP: 110/67 134/65 112/64 112/60  Pulse: 64 62 65 65  Resp: 17 16    Temp:  98.1 F (36.7 C) 98.4 F (36.9 C) 99.3 F (37.4 C)  TempSrc:  Oral Oral Oral  SpO2: 94% 98% 97% 98%  Weight:      Height:        Intake/Output Summary (Last 24 hours) at 12/17/15 1615 Last data filed at 12/17/15 1300  Gross per 24 hour  Intake              240 ml  Output              725 ml  Net             -485 ml   Filed Weights   12/16/15 1157  Weight: 69.9 kg (154 lb)    Examination:  General exam:  Adult male.  No acute distress.  HEENT:  NCAT, MMM Respiratory system: Clear to auscultation bilaterally Cardiovascular system: Regular rate and rhythm, gallop present.  Warm extremities Gastrointestinal system: Normal active bowel sounds, soft, nondistended, nontender. MSK:  Normal tone and bulk, trace lower extremity edema.  Right leg externally rotated and foreshortened.   Neuro:  Grossly intact.  Able to wiggle toes on right foot.      Data Reviewed: I have personally reviewed following labs and imaging studies  CBC:  Recent Labs Lab 12/16/15 1335 12/17/15 0603  WBC 13.4* 9.6  NEUTROABS 11.4*  --   HGB 11.0* 9.7*  HCT 36.2* 32.3*  MCV 91.6 93.4  PLT 168 0000000   Basic Metabolic Panel:  Recent Labs Lab 12/16/15 1335 12/17/15 0603  NA 142 142  K 5.3* 4.8  CL 113* 109  CO2 24 27  GLUCOSE 220* 101*  BUN 31* 27*  CREATININE 1.83* 1.74*  CALCIUM 8.8* 8.4*   GFR: Estimated Creatinine Clearance: 39.6 mL/min (by C-G formula based on SCr of 1.74 mg/dL). Liver Function Tests:  Recent Labs Lab 12/16/15 1335  AST 67*  ALT 61  ALKPHOS 146*  BILITOT 0.7  PROT 7.4  ALBUMIN 3.0*   No results for input(s): LIPASE, AMYLASE in the last 168 hours. No results for input(s): AMMONIA in the last 168 hours. Coagulation Profile: No results for input(s): INR,  PROTIME in the last 168 hours. Cardiac Enzymes: No results for input(s): CKTOTAL, CKMB, CKMBINDEX, TROPONINI in the last 168 hours. BNP (last 3 results) No results for input(s): PROBNP in the last 8760 hours. HbA1C: No results for input(s): HGBA1C in the last 72 hours. CBG:  Recent Labs Lab 12/16/15 2001 12/17/15 0528 12/17/15 1047  GLUCAP 192* 109* 139*   Lipid Profile: No results for input(s): CHOL, HDL, LDLCALC, TRIG, CHOLHDL, LDLDIRECT in the last 72 hours. Thyroid Function Tests: No results for input(s): TSH, T4TOTAL, FREET4, T3FREE, THYROIDAB in the last 72 hours. Anemia Panel: No results  for input(s): VITAMINB12, FOLATE, FERRITIN, TIBC, IRON, RETICCTPCT in the last 72 hours. Urine analysis:    Component Value Date/Time   COLORURINE YELLOW 10/27/2015 0900   APPEARANCEUR CLOUDY (A) 10/27/2015 0900   LABSPEC 1.028 10/27/2015 0900   PHURINE 5.0 10/27/2015 0900   GLUCOSEU >1000 (A) 10/27/2015 0900   HGBUR SMALL (A) 10/27/2015 0900   BILIRUBINUR negative 11/28/2015 1442   KETONESUR NEGATIVE 10/27/2015 0900   PROTEINUR positive 11/28/2015 1442   PROTEINUR NEGATIVE 10/27/2015 0900   UROBILINOGEN negative 11/28/2015 1442   UROBILINOGEN 0.2 09/06/2011 0908   NITRITE negative 11/28/2015 1442   NITRITE POSITIVE (A) 10/27/2015 0900   LEUKOCYTESUR moderate (2+) (A) 11/28/2015 1442   Sepsis Labs: @LABRCNTIP (procalcitonin:4,lacticidven:4)  )No results found for this or any previous visit (from the past 240 hour(s)).    Radiology Studies: Dg Chest 2 View  Result Date: 12/16/2015 CLINICAL DATA:  Fall yesterday, hip fracture. EXAM: CHEST  2 VIEW COMPARISON:  Chest x-rays dated 11/28/2015 and 11/04/2015. FINDINGS: Mild cardiomegaly is stable. Left chest wall pacemaker/ICD in place. Lungs are clear. No evidence of pneumonia. No pleural effusion or pneumothorax seen. Osseous structures about the chest are unremarkable. IMPRESSION: No active cardiopulmonary disease.  Stable mild  cardiomegaly. Electronically Signed   By: Franki Cabot M.D.   On: 12/16/2015 14:22   Ct Head Wo Contrast  Result Date: 12/16/2015 CLINICAL DATA:  fall fall at or. EXAM: CT HEAD WITHOUT CONTRAST TECHNIQUE: Contiguous axial images were obtained from the base of the skull through the vertex without intravenous contrast. COMPARISON:  CT 11/21/2015 FINDINGS: Brain: No acute intracranial hemorrhage. No focal mass lesion. No CT evidence of acute infarction. No midline shift or mass effect. No hydrocephalus. Basilar cisterns are patent. There are periventricular and subcortical white matter hypodensities. Generalized cortical atrophy. Vascular: Unremarkable Skull: No calvarial lesion Sinuses/Orbits: Paranasal sinuses and mastoid air cells are clear. Orbits are clear. Other: None IMPRESSION: 1. No acute intracranial findings. 2. Atrophy and chronic white matter microvascular disease. Electronically Signed   By: Suzy Bouchard M.D.   On: 12/16/2015 13:56   Dg Hip Unilat  With Pelvis 2-3 Views Right  Result Date: 12/16/2015 CLINICAL DATA:  [place] last night.  Right hip pain. EXAM: DG HIP (WITH OR WITHOUT PELVIS) 2-3V RIGHT COMPARISON:  None. FINDINGS: There is a fracture of the right femoral neck. Small comminuted fracture components are noted. Primary fracture components are displaced, with the shaft component migrating superiorly by approximately 2 cm. There is mild varus angulation and mild apex anterior angulation. No other fractures. Hip joints are normally aligned as are the SI joints and symphysis pubis. Bones are demineralized. IMPRESSION: Displaced, mildly angulated, right femoral neck fracture. Electronically Signed   By: Lajean Manes M.D.   On: 12/16/2015 12:55     Scheduled Meds: . amiodarone  200 mg Oral Daily  . aspirin EC  81 mg Oral Daily  . carvedilol  1.5625 mg Oral BID WC  . clopidogrel  75 mg Oral Daily  . collagenase   Topical BID  . digoxin  0.125 mg Oral Daily  . docusate sodium  100  mg Oral BID  . heparin  5,000 Units Subcutaneous Q8H  . insulin aspart  0-9 Units Subcutaneous TID WC  . insulin detemir  10 Units Subcutaneous QHS  . mirtazapine  15 mg Oral QHS  . rosuvastatin  40 mg Oral Daily  . senna  1 tablet Oral BID  . spironolactone  12.5 mg Oral Daily  .  tamsulosin  0.4 mg Oral Daily  . Tdap  0.5 mL Intramuscular Once  . torsemide  20 mg Oral Daily   Continuous Infusions:    LOS: 1 day    Time spent: 30 min    Janece Canterbury, MD [facility] Pager [phone]  If 7PM-7AM, please contact night-coverage [email or web address] Password [facility] 12/17/2015, 4:15 PM

## 2015-12-17 NOTE — Progress Notes (Addendum)
Initial Nutrition Assessment  DOCUMENTATION CODES:   Severe malnutrition in context of chronic illness  INTERVENTION:    Snacks TID between meals.  NUTRITION DIAGNOSIS:   Malnutrition related to chronic illness as evidenced by severe depletion of muscle mass, severe depletion of body fat.  GOAL:   Patient will meet greater than or equal to 90% of their needs  MONITOR:   PO intake, Supplement acceptance, Weight trends, Skin, I & O's  REASON FOR ASSESSMENT:   Consult Hip fracture protocol  ASSESSMENT:   70 y.o. male with CAD, V-tach s/p PPM-ICD, ischemic cardiomyopathy with EF 15-20%, stroke, diabetes mellitus type 2, hypertension, gout, and CKD stage III who presents with a fall and hip pain. Admitted on 8/25 with femoral neck fracture.  Patient and his wife report that patient has been eating very well at home. He was down to 141 lbs in January, now up to 154 lbs.  Nutrition-Focused physical exam completed. Findings are severe fat depletion, severe muscle depletion, and mild edema.  Patient with ongoing severe PCM. He still needs extra calories and protein to continue weight gain. He is eating 100% of meals. He does not like Ensure and Boost supplements; will add snacks between meals.   Diet Order:  Diet heart healthy/carb modified Room service appropriate? Yes; Fluid consistency: Thin  Skin:  Wound (see comment) (stage I to sacrum; L foot diabetic ulcer)  Last BM:  8/24  Height:   Ht Readings from Last 1 Encounters:  12/16/15 6\' 2"  (1.88 m)    Weight:   Wt Readings from Last 1 Encounters:  12/16/15 154 lb (69.9 kg)    Ideal Body Weight:  86.4 kg  BMI:  Body mass index is 19.77 kg/m.  Estimated Nutritional Needs:   Kcal:  1900-2100  Protein:  90-110 gm  Fluid:  >/= 2 L  EDUCATION NEEDS:   No education needs identified at this time  Molli Barrows, [place], [place], [place] Pager [phone] After Hours Pager [phone]

## 2015-12-18 ENCOUNTER — Ambulatory Visit: Payer: Self-pay | Admitting: Orthopedic Surgery

## 2015-12-18 ENCOUNTER — Encounter (HOSPITAL_COMMUNITY)
Admission: EM | Disposition: A | Discharge status: Skilled Nursing Facility | Payer: Self-pay | Source: Home / Self Care | Attending: Internal Medicine

## 2015-12-18 ENCOUNTER — Inpatient Hospital Stay (HOSPITAL_COMMUNITY): Payer: Commercial Managed Care - HMO | Admitting: Certified Registered"

## 2015-12-18 HISTORY — PX: HIP ARTHROPLASTY: SHX981

## 2015-12-18 LAB — BASIC METABOLIC PANEL
Anion gap: 5 (ref 5–15)
BUN: 30 mg/dL — ABNORMAL HIGH (ref 6–20)
CALCIUM: 8.4 mg/dL — AB (ref 8.9–10.3)
CO2: 26 mmol/L (ref 22–32)
CREATININE: 1.78 mg/dL — AB (ref 0.61–1.24)
Chloride: 106 mmol/L (ref 101–111)
GFR calc non Af Amer: 37 mL/min — ABNORMAL LOW (ref >60)
GFR, EST AFRICAN AMERICAN: 43 mL/min — AB (ref >60)
Glucose, Bld: 101 mg/dL — ABNORMAL HIGH (ref 65–99)
Potassium: 4.6 mmol/L (ref 3.5–5.1)
Sodium: 137 mmol/L (ref 135–145)

## 2015-12-18 LAB — CBC
HEMATOCRIT: 34.8 % — AB (ref 39.0–52.0)
Hemoglobin: 10.6 g/dL — ABNORMAL LOW (ref 13.0–17.0)
MCH: 28 pg (ref 26.0–34.0)
MCHC: 30.5 g/dL (ref 30.0–36.0)
MCV: 91.8 fL (ref 78.0–100.0)
PLATELETS: 165 10*3/uL (ref 150–400)
RBC: 3.79 MIL/uL — ABNORMAL LOW (ref 4.22–5.81)
RDW: 16.1 % — AB (ref 11.5–15.5)
WBC: 10.6 10*3/uL — ABNORMAL HIGH (ref 4.0–10.5)

## 2015-12-18 LAB — MRSA PCR SCREENING: MRSA by PCR: NEGATIVE

## 2015-12-18 LAB — GLUCOSE, CAPILLARY
Glucose-Capillary: 93 mg/dL (ref 65–99)
Glucose-Capillary: 106 mg/dL — ABNORMAL HIGH (ref 65–99)
Glucose-Capillary: 111 mg/dL — ABNORMAL HIGH (ref 65–99)
Glucose-Capillary: 181 mg/dL — ABNORMAL HIGH (ref 65–99)

## 2015-12-18 SURGERY — HEMIARTHROPLASTY, HIP, DIRECT ANTERIOR APPROACH, FOR FRACTURE
Anesthesia: General | Site: Hip | Laterality: Right

## 2015-12-18 MED ORDER — CHLORHEXIDINE GLUCONATE 4 % EX LIQD
60.0000 mL | Freq: Once | CUTANEOUS | Status: DC
  Filled 2015-12-18: qty 60

## 2015-12-18 MED ORDER — PHENYLEPHRINE HCL 10 MG/ML IJ SOLN
INTRAMUSCULAR | Status: DC | PRN
  Administered 2015-12-18: 30 ug/min via INTRAVENOUS

## 2015-12-18 MED ORDER — ONDANSETRON HCL 4 MG/2ML IJ SOLN
INTRAMUSCULAR | Status: DC | PRN
  Administered 2015-12-18: 4 mg via INTRAVENOUS

## 2015-12-18 MED ORDER — FENTANYL CITRATE (PF) 100 MCG/2ML IJ SOLN
25.0000 ug | INTRAMUSCULAR | Status: DC | PRN
  Administered 2015-12-18: 25 ug via INTRAVENOUS

## 2015-12-18 MED ORDER — PHENYLEPHRINE 40 MCG/ML (10ML) SYRINGE FOR IV PUSH (FOR BLOOD PRESSURE SUPPORT)
PREFILLED_SYRINGE | INTRAVENOUS | Status: AC
  Filled 2015-12-18: qty 10

## 2015-12-18 MED ORDER — ROCURONIUM BROMIDE 100 MG/10ML IV SOLN
INTRAVENOUS | Status: DC | PRN
  Administered 2015-12-18: 40 mg via INTRAVENOUS
  Administered 2015-12-18: 30 mg via INTRAVENOUS

## 2015-12-18 MED ORDER — 0.9 % SODIUM CHLORIDE (POUR BTL) OPTIME
TOPICAL | Status: DC | PRN
  Administered 2015-12-18: 1000 mL

## 2015-12-18 MED ORDER — PHENYLEPHRINE HCL 10 MG/ML IJ SOLN
INTRAMUSCULAR | Status: DC | PRN
  Administered 2015-12-18: 80 ug via INTRAVENOUS

## 2015-12-18 MED ORDER — LIDOCAINE 2% (20 MG/ML) 5 ML SYRINGE
INTRAMUSCULAR | Status: AC
  Filled 2015-12-18: qty 5

## 2015-12-18 MED ORDER — TRANEXAMIC ACID 1000 MG/10ML IV SOLN
2000.0000 mg | INTRAVENOUS | Status: DC
  Filled 2015-12-18: qty 20

## 2015-12-18 MED ORDER — ONDANSETRON HCL 4 MG/2ML IJ SOLN: 4.0000 mg | Freq: Once | INTRAMUSCULAR | Status: DC | PRN

## 2015-12-18 MED ORDER — PROPOFOL 10 MG/ML IV BOLUS
INTRAVENOUS | Status: AC
  Filled 2015-12-18: qty 20

## 2015-12-18 MED ORDER — POVIDONE-IODINE 10 % EX SWAB: 2.0000 "application " | Freq: Once | CUTANEOUS | Status: DC

## 2015-12-18 MED ORDER — PNEUMOCOCCAL VAC POLYVALENT 25 MCG/0.5ML IJ INJ
0.5000 mL | INJECTION | INTRAMUSCULAR | Status: AC
  Administered 2015-12-19: 0.5 mL via INTRAMUSCULAR
  Filled 2015-12-18: qty 0.5

## 2015-12-18 MED ORDER — EPHEDRINE SULFATE 50 MG/ML IJ SOLN
INTRAMUSCULAR | Status: DC | PRN
  Administered 2015-12-18 (×5): 10 mg via INTRAVENOUS

## 2015-12-18 MED ORDER — LIDOCAINE HCL (CARDIAC) 20 MG/ML IV SOLN
INTRAVENOUS | Status: DC | PRN
  Administered 2015-12-18: 40 mg via INTRAVENOUS

## 2015-12-18 MED ORDER — ETOMIDATE 2 MG/ML IV SOLN
INTRAVENOUS | Status: DC | PRN
  Administered 2015-12-18: 14 mg via INTRAVENOUS

## 2015-12-18 MED ORDER — SODIUM CHLORIDE 0.9 % IR SOLN
Status: DC | PRN
  Administered 2015-12-18: 3000 mL

## 2015-12-18 MED ORDER — LACTATED RINGERS IV SOLN
INTRAVENOUS | Status: DC | PRN
  Administered 2015-12-18: 10:00:00 via INTRAVENOUS

## 2015-12-18 MED ORDER — CEFAZOLIN SODIUM 1 G IJ SOLR
INTRAMUSCULAR | Status: DC | PRN
  Administered 2015-12-18: 2 g via INTRAMUSCULAR

## 2015-12-18 MED ORDER — OXYCODONE HCL 5 MG/5ML PO SOLN
5.0000 mg | Freq: Once | ORAL | Status: DC | PRN
Start: 2015-12-18 — End: 2015-12-18

## 2015-12-18 MED ORDER — OXYCODONE HCL 5 MG PO TABS
5.0000 mg | ORAL_TABLET | Freq: Once | ORAL | Status: DC | PRN
Start: 2015-12-18 — End: 2015-12-18

## 2015-12-18 MED ORDER — ROCURONIUM BROMIDE 10 MG/ML (PF) SYRINGE
PREFILLED_SYRINGE | INTRAVENOUS | Status: AC
  Filled 2015-12-18: qty 10

## 2015-12-18 MED ORDER — FENTANYL CITRATE (PF) 100 MCG/2ML IJ SOLN
INTRAMUSCULAR | Status: AC
  Administered 2015-12-18: 25 ug via INTRAVENOUS
  Filled 2015-12-18: qty 2

## 2015-12-18 MED ORDER — EPHEDRINE 5 MG/ML INJ
INTRAVENOUS | Status: AC
  Filled 2015-12-18: qty 10

## 2015-12-18 MED ORDER — FENTANYL CITRATE (PF) 100 MCG/2ML IJ SOLN
INTRAMUSCULAR | Status: AC
  Filled 2015-12-18: qty 4

## 2015-12-18 MED ORDER — ACETAMINOPHEN 500 MG PO TABS: 1000.0000 mg | ORAL_TABLET | Freq: Once | ORAL | Status: DC

## 2015-12-18 MED ORDER — FENTANYL CITRATE (PF) 100 MCG/2ML IJ SOLN
INTRAMUSCULAR | Status: DC | PRN
  Administered 2015-12-18 (×3): 50 ug via INTRAVENOUS

## 2015-12-18 MED ORDER — CEFAZOLIN SODIUM-DEXTROSE 2-4 GM/100ML-% IV SOLN
2.0000 g | INTRAVENOUS | Status: DC
  Filled 2015-12-18: qty 100

## 2015-12-18 MED ORDER — SUGAMMADEX SODIUM 200 MG/2ML IV SOLN
INTRAVENOUS | Status: DC | PRN
  Administered 2015-12-18: 140 mg via INTRAVENOUS

## 2015-12-18 MED ORDER — ALBUMIN HUMAN 5 % IV SOLN
INTRAVENOUS | Status: DC | PRN
  Administered 2015-12-18: 11:00:00 via INTRAVENOUS

## 2015-12-18 MED ORDER — SODIUM CHLORIDE 0.9 % IV SOLN: INTRAVENOUS | Status: DC

## 2015-12-18 SURGICAL SUPPLY — 47 items
BLADE SAW RECIP 87.9 MT (BLADE) IMPLANT
BNDG COHESIVE 6X5 TAN STRL LF (GAUZE/BANDAGES/DRESSINGS) ×3 IMPLANT
CAPT HIP HEMI 2 ×2 IMPLANT
COVER SURGICAL LIGHT HANDLE (MISCELLANEOUS) ×3 IMPLANT
DRAPE IMP U-DRAPE 54X76 (DRAPES) ×3 IMPLANT
DRAPE INCISE IOBAN 66X45 STRL (DRAPES) ×3 IMPLANT
DRAPE ORTHO SPLIT 77X108 STRL (DRAPES) ×6
DRAPE SURG ORHT 6 SPLT 77X108 (DRAPES) ×2 IMPLANT
DRAPE U-SHAPE 47X51 STRL (DRAPES) ×3 IMPLANT
DRILL BIT 7/64X5 (BIT) ×3 IMPLANT
DRSG ADAPTIC 3X8 NADH LF (GAUZE/BANDAGES/DRESSINGS) ×3 IMPLANT
DRSG AQUACEL AG ADV 3.5X10 (GAUZE/BANDAGES/DRESSINGS) ×2 IMPLANT
DRSG PAD ABDOMINAL 8X10 ST (GAUZE/BANDAGES/DRESSINGS) ×3 IMPLANT
DURAPREP 26ML APPLICATOR (WOUND CARE) ×3 IMPLANT
ELECT CAUTERY BLADE 6.4 (BLADE) ×3 IMPLANT
ELECT REM PT RETURN 9FT ADLT (ELECTROSURGICAL) ×3
ELECTRODE REM PT RTRN 9FT ADLT (ELECTROSURGICAL) ×1 IMPLANT
GAUZE SPONGE 4X4 12PLY STRL (GAUZE/BANDAGES/DRESSINGS) ×3 IMPLANT
GLOVE BIOGEL PI IND STRL 8.5 (GLOVE) ×5 IMPLANT
GLOVE BIOGEL PI INDICATOR 8.5 (GLOVE) ×10
GLOVE SURG ORTHO 8.0 STRL STRW (GLOVE) ×18 IMPLANT
GOWN STRL REUS W/ TWL LRG LVL3 (GOWN DISPOSABLE) ×1 IMPLANT
GOWN STRL REUS W/ TWL XL LVL3 (GOWN DISPOSABLE) ×2 IMPLANT
GOWN STRL REUS W/TWL 2XL LVL3 (GOWN DISPOSABLE) ×3 IMPLANT
GOWN STRL REUS W/TWL LRG LVL3 (GOWN DISPOSABLE) ×3
GOWN STRL REUS W/TWL XL LVL3 (GOWN DISPOSABLE) ×6
HOOD PEEL AWAY FACE SHEILD DIS (HOOD) IMPLANT
KIT BASIN OR (CUSTOM PROCEDURE TRAY) ×3 IMPLANT
KIT ROOM TURNOVER OR (KITS) ×3 IMPLANT
MANIFOLD NEPTUNE II (INSTRUMENTS) ×3 IMPLANT
NS IRRIG 1000ML POUR BTL (IV SOLUTION) ×3 IMPLANT
PACK TOTAL JOINT (CUSTOM PROCEDURE TRAY) ×3 IMPLANT
PACK UNIVERSAL I (CUSTOM PROCEDURE TRAY) ×3 IMPLANT
PAD ARMBOARD 7.5X6 YLW CONV (MISCELLANEOUS) ×6 IMPLANT
PASSER SUT SWANSON 36MM LOOP (INSTRUMENTS) ×3 IMPLANT
PILLOW ABDUCTION HIP (SOFTGOODS) IMPLANT
STAPLER VISISTAT 35W (STAPLE) ×3 IMPLANT
SUCTION FRAZIER HANDLE 10FR (MISCELLANEOUS) ×2
SUCTION TUBE FRAZIER 10FR DISP (MISCELLANEOUS) ×1 IMPLANT
SUT VIC AB 1 CT1 27 (SUTURE) ×3
SUT VIC AB 1 CT1 27XBRD ANTBC (SUTURE) ×1 IMPLANT
SUT VIC AB 2-0 CT1 18 (SUTURE) ×3 IMPLANT
SUT VICRYL 0 CT 1 36IN (SUTURE) ×3 IMPLANT
TOWEL OR 17X24 6PK STRL BLUE (TOWEL DISPOSABLE) ×3 IMPLANT
TOWEL OR 17X26 10 PK STRL BLUE (TOWEL DISPOSABLE) ×3 IMPLANT
TRAY FOLEY CATH 16FRSI W/METER (SET/KITS/TRAYS/PACK) ×3 IMPLANT
WATER STERILE IRR 1000ML POUR (IV SOLUTION) ×12 IMPLANT

## 2015-12-18 NOTE — Transfer of Care (Signed)
Immediate Anesthesia Transfer of Care Note  Patient: [name] [name]  Procedure(s) Performed: Procedure(s): ARTHROPLASTY BIPOLAR HIP (HEMIARTHROPLASTY) (Right)  Patient Location: PACU  Anesthesia Type:General  Level of Consciousness: awake and patient cooperative  Airway & Oxygen Therapy: Patient Spontanous Breathing and Patient connected to face mask oxygen  Post-op Assessment: Report given to RN and Post -op Vital signs reviewed and stable  Post vital signs: Reviewed and stable  Last Vitals:  Vitals:   12/18/15 0428 12/18/15 0736  BP: 119/63 115/72  Pulse: (!) 59 65  Resp:    Temp: 37.4 C     Last Pain:  Vitals:   12/18/15 0800  TempSrc:   PainSc: 3          Complications: No apparent anesthesia complications

## 2015-12-18 NOTE — Progress Notes (Signed)
Orthopedic Tech Progress Note Patient Details:  [name] [name] Feb 05, 1946 [id number]  Ortho Devices Ortho Device/Splint Location: Trapeze bar Ortho Device/Splint Interventions: Application   [name] 12/18/2015, 6:33 PM
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
## 2015-12-18 NOTE — Progress Notes (Signed)
Patient is being transferred to a Step Down unit.  Currently is still in PACU.  Patient's daughter came up to his room and got his belongings. PACU stated they will give the unit my name if they need more information.

## 2015-12-18 NOTE — Anesthesia Procedure Notes (Signed)
Procedure Name: Intubation Date/Time: 12/18/2015 9:51 AM Performed by: Lance Coon Pre-anesthesia Checklist: Patient identified, Emergency Drugs available, Suction available, Patient being monitored and Timeout performed Patient Re-evaluated:Patient Re-evaluated prior to inductionOxygen Delivery Method: Circle system utilized Preoxygenation: Pre-oxygenation with 100% oxygen Intubation Type: IV induction Ventilation: Mask ventilation without difficulty Laryngoscope Size: Miller and 2 Grade View: Grade I Tube type: Oral Tube size: 7.5 mm Number of attempts: 1 Airway Equipment and Method: Stylet Placement Confirmation: ETT inserted through vocal cords under direct vision,  breath sounds checked- equal and bilateral and positive ETCO2 Secured at: 22 cm Tube secured with: Tape Dental Injury: Teeth and Oropharynx as per pre-operative assessment

## 2015-12-18 NOTE — Op Note (Signed)
Dictation Number: [id number]

## 2015-12-18 NOTE — Anesthesia Preprocedure Evaluation (Signed)
Anesthesia Evaluation  Patient identified by MRN, date of birth, ID band Patient awake    Reviewed: Allergy & Precautions, NPO status , Patient's Chart, lab work & pertinent test results  Airway Mallampati: II  TM Distance: >3 FB Neck ROM: Full    Dental  (+) Edentulous Upper, Dental Advisory Given   Pulmonary    breath sounds clear to auscultation       Cardiovascular hypertension,  Rhythm:Regular Rate:Normal     Neuro/Psych    GI/Hepatic   Endo/Other  diabetes  Renal/GU      Musculoskeletal   Abdominal   Peds  Hematology   Anesthesia Other Findings   Reproductive/Obstetrics                             Anesthesia Physical Anesthesia Plan  ASA: III  Anesthesia Plan: General   Post-op Pain Management:    Induction: Intravenous  Airway Management Planned: Oral ETT  Additional Equipment: Arterial line  Intra-op Plan:   Post-operative Plan: Possible Post-op intubation/ventilation  Informed Consent: I have reviewed the patients History and Physical, chart, labs and discussed the procedure including the risks, benefits and alternatives for the proposed anesthesia with the patient or authorized representative who has indicated his/her understanding and acceptance.   Dental advisory given  Plan Discussed with: CRNA and Anesthesiologist  Anesthesia Plan Comments:         Anesthesia Quick Evaluation

## 2015-12-18 NOTE — Progress Notes (Signed)
Contacted OR and provided patient's Implant Difibulator  information. [name] [name] is the manufacturer, Model number is [id number]. RVA number is: [id number]. Model number: [id number].

## 2015-12-18 NOTE — Progress Notes (Signed)
PROGRESS NOTE  [name] [name]  [id number] DOB: 1945/08/05 DOA: 12/16/2015 PCP: [name], MD  Brief Narrative:   [name] [name] is a 70 y.o. male with CAD, V-tach s/p PPM-ICD, ischemic cardiomyopathy with EF 15-20%, stroke, diabetes mellitus type 2, hypertension, gout, and CKD stage III who presents with a fall and hip pain.  He was hospitalized from 10/27/2015 through 11/06/2015.  He presented with confusion and vomiting.  In the ER, he had VT and his AICD fired.  He was admitted to the ICU and was found to have an NSTEMI (trop > 65), aspiration pneumonia, and Klebsiella UTI.  He underwent left heart catheterization which showed multivessel disease with a 90% stenosis of the prox LAD to mid LAD, totally occluded RCA, and a 90% stenosed distal circ.  He had a DES placed in the distal Cx on 7/11.  His echocardiogram demonstrated an EF of 15-20% with akinesis of the apical myocardium and significant LV dilation.  He was discharged to SNF where he stayed for 20 days and was discharged to home.   On the day of admission, he had a mechanical fall while trying to open a door. He was found to have a right femoral neck fracture.  Cardiology and orthopedics have been consulted.  He is moderate to high risk for surgery and cannot stop his aspirin or plavix.  Patient would like to pursue surgery.   Assessment & Plan:   Active Problems:   Essential hypertension   Chronic combined systolic and diastolic heart failure (HCC)   Type 2 diabetes mellitus with diabetic nephropathy (HCC)   Chronic renal insufficiency, stage III (moderate)   CAD, multiple vessel, RCA 100% mid occl.; LAD 99% stenosisprox.  mid of 50-60%; LCX OM-1 90%, OM-2 99%,AV groove 80%    Fracture, hip (HCC)   Pressure ulcer   Protein-calorie malnutrition, severe  Right femoral neck fracture s/p right hemiarthroplasty on 8/27 by Dr. Ronnie Derby -  Pain control -  Judicious use of IVF after surgery -  Strict I/O and if uop decreases, will  resume IVF  Multivessel CAD s/p NSTEMI with peak Troponin >65 s/p DES to distal Cx on 11/01/2015 -  Continue aspirin, plavix, low dose beta blocker, and statin -  Cardiology consult appreciated -  Patient has been chest free -  NTG prn  Ischemic Cardiomyopathy w/ Systolic and Diastolic CHF, followed by Dr. Haroldine Laws - 7/8 echocardiogram EF 0000000, and diastolic CHF see results below - continue BB, digoxin - hold torsemide and spironolactone - not on ACEI/ARB due to CKD  Ventricular Tachycardia - IACD Fired in ED 7/6 -Amiodarone 200 mg BID  Probable Partial Seizure with left arm shaking.   -  F/u with Neurology on 8/31. -  Not on seizure medication   H/O CVA - Mild persistent right-sided weakness   CKD III (baseline Cr 1.7) -  Minimize nephrotoxins and renally dose medications   Mild hyperkalemia, asymptomatic, likely related to spironolactone use in setting of CKD -  Hold spironolactone  DM Type 2 uncontrolled 7/6 Hemoglobin A1c 12.0 - hold evening Lantus - continue low dose SSI  Left foot diabetic ulcer -Left plantar surface ulcer lateral aspect. Santyl ointment BID  Multiple abrasions:  Apply vaseline and cover with dry bandage  Leukocytosis, likely related to fall -  No pneumonia on CXR -  Check UA  Normocytic anemia, likely due to CKD, hemoglobin at baseline of 10-11mg /dl.  Occult negative.  Stage 1 pressure ulcer on sacrum, present  at time of admission  Severe protein calorie malnutrition -  Supplements  -  Liberalize diet  DVT prophylaxis: heparin and SCDs Code Status: full Family Communication: patient, his wife and daughter  Disposition Plan:  likely to SNF in a few days  Consultants:   Orthopedic surgery, Dr. Ronnie Derby  [facility]  Procedures:  none  Antimicrobials:   none    Subjective: Minimal pain in the right leg postoperatively and after just receiving pain medication.  Denies chest pain, SOB, lightheadedness    Objective: Vitals:   12/18/15 1300 12/18/15 1315 12/18/15 1330 12/18/15 1415  BP: (P) 116/68 (P) 115/71 (P) 108/64   Pulse:      Resp:      Temp:    (P) 97.9 F (36.6 C)  TempSrc:      SpO2:      Weight:      Height:        Intake/Output Summary (Last 24 hours) at 12/18/15 1446 Last data filed at 12/18/15 1358  Gross per 24 hour  Intake              750 ml  Output              900 ml  Net             -150 ml   Filed Weights   12/16/15 1157  Weight: 69.9 kg (154 lb)    Examination:  General exam:  Adult male.  No acute distress.  HEENT:  NCAT, MMM Respiratory system: Clear to auscultation bilaterally Cardiovascular system: Regular rate and rhythm, gallop present.  Warm extremities Gastrointestinal system: Normal active bowel sounds, soft, nondistended, nontender. MSK:  Normal tone and bulk, trace lower extremity edema.  Right leg dressing c/d/i, minimal swelling and no bruising.  2+ pulse Neuro:  Grossly intact.  Able to wiggle toes on right foot.  SITLT right foot    Data Reviewed: I have personally reviewed following labs and imaging studies  CBC:  Recent Labs Lab 12/16/15 1335 12/17/15 0603 12/18/15 0333  WBC 13.4* 9.6 10.6*  NEUTROABS 11.4*  --   --   HGB 11.0* 9.7* 10.6*  HCT 36.2* 32.3* 34.8*  MCV 91.6 93.4 91.8  PLT 168 153 [id number]   Basic Metabolic Panel:  Recent Labs Lab 12/16/15 1335 12/17/15 0603 12/18/15 0333  NA 142 142 137  K 5.3* 4.8 4.6  CL 113* 109 106  CO2 24 27 26   GLUCOSE 220* 101* 101*  BUN 31* 27* 30*  CREATININE 1.83* 1.74* 1.78*  CALCIUM 8.8* 8.4* 8.4*   GFR: Estimated Creatinine Clearance: 38.7 mL/min (by C-G formula based on SCr of 1.78 mg/dL). Liver Function Tests:  Recent Labs Lab 12/16/15 1335  AST 67*  ALT 61  ALKPHOS 146*  BILITOT 0.7  PROT 7.4  ALBUMIN 3.0*   No results for input(s): LIPASE, AMYLASE in the last 168 hours. No results for input(s): AMMONIA in the last 168 hours. Coagulation Profile: No  results for input(s): INR, PROTIME in the last 168 hours. Cardiac Enzymes: No results for input(s): CKTOTAL, CKMB, CKMBINDEX, TROPONINI in the last 168 hours. BNP (last 3 results) No results for input(s): PROBNP in the last 8760 hours. HbA1C: No results for input(s): HGBA1C in the last 72 hours. CBG:  Recent Labs Lab 12/17/15 1047 12/17/15 1630 12/17/15 2006 12/18/15 0425 12/18/15 1204  GLUCAP 139* 159* 200* 93 106*   Lipid Profile: No results for input(s): CHOL, HDL, LDLCALC, TRIG, CHOLHDL, LDLDIRECT in  the last 72 hours. Thyroid Function Tests: No results for input(s): TSH, T4TOTAL, FREET4, T3FREE, THYROIDAB in the last 72 hours. Anemia Panel: No results for input(s): VITAMINB12, FOLATE, FERRITIN, TIBC, IRON, RETICCTPCT in the last 72 hours. Urine analysis:    Component Value Date/Time   COLORURINE YELLOW 10/27/2015 0900   APPEARANCEUR CLOUDY (A) 10/27/2015 0900   LABSPEC 1.028 10/27/2015 0900   PHURINE 5.0 10/27/2015 0900   GLUCOSEU >1000 (A) 10/27/2015 0900   HGBUR SMALL (A) 10/27/2015 0900   BILIRUBINUR negative 11/28/2015 1442   KETONESUR NEGATIVE 10/27/2015 0900   PROTEINUR positive 11/28/2015 1442   PROTEINUR NEGATIVE 10/27/2015 0900   UROBILINOGEN negative 11/28/2015 1442   UROBILINOGEN 0.2 09/06/2011 0908   NITRITE negative 11/28/2015 1442   NITRITE POSITIVE (A) 10/27/2015 0900   LEUKOCYTESUR moderate (2+) (A) 11/28/2015 1442   Sepsis Labs: @LABRCNTIP (procalcitonin:4,lacticidven:4)  ) Recent Results (from the past 240 hour(s))  MRSA PCR Screening     Status: None   Collection Time: 12/18/15  4:51 AM  Result Value Ref Range Status   MRSA by PCR NEGATIVE NEGATIVE Final    Comment:        The GeneXpert MRSA Assay (FDA approved for NASAL specimens only), is one component of a comprehensive MRSA colonization surveillance program. It is not intended to diagnose MRSA infection nor to guide or monitor treatment for MRSA infections.       Radiology  Studies: No results found.   Scheduled Meds: . amiodarone  200 mg Oral Daily  . aspirin EC  81 mg Oral Daily  . carvedilol  1.5625 mg Oral BID WC  . clopidogrel  75 mg Oral Daily  . collagenase   Topical BID  . digoxin  0.125 mg Oral Daily  . docusate sodium  100 mg Oral BID  . heparin  5,000 Units Subcutaneous Q8H  . insulin aspart  0-9 Units Subcutaneous TID WC  . insulin detemir  10 Units Subcutaneous QHS  . mirtazapine  15 mg Oral QHS  . rosuvastatin  40 mg Oral Daily  . senna  1 tablet Oral BID  . spironolactone  12.5 mg Oral Daily  . tamsulosin  0.4 mg Oral Daily  . Tdap  0.5 mL Intramuscular Once  . torsemide  20 mg Oral Daily   Continuous Infusions:    LOS: 2 days    Time spent: 30 min    Janece Canterbury, MD [facility] Pager [phone]  If 7PM-7AM, please contact night-coverage [email or web address] Password [facility] 12/18/2015, 2:46 PM

## 2015-12-18 NOTE — Anesthesia Postprocedure Evaluation (Signed)
Anesthesia Post Note  Patient: [name] [name]  Procedure(s) Performed: Procedure(s) (LRB): ARTHROPLASTY BIPOLAR HIP (HEMIARTHROPLASTY) (Right)  Patient location during evaluation: PACU Anesthesia Type: General Level of consciousness: awake, awake and alert and oriented Pain management: pain level controlled Vital Signs Assessment: post-procedure vital signs reviewed and stable Respiratory status: spontaneous breathing, nonlabored ventilation and respiratory function stable Cardiovascular status: blood pressure returned to baseline Anesthetic complications: no    Last Vitals:  Vitals:   12/18/15 1200 12/18/15 1215  BP: 121/70 116/71  Pulse:    Resp:    Temp:      Last Pain:  Vitals:   12/18/15 1200  TempSrc:   PainSc: 2                  Eleena Grater COKER

## 2015-12-19 ENCOUNTER — Encounter (HOSPITAL_COMMUNITY): Payer: Self-pay | Admitting: Orthopedic Surgery

## 2015-12-19 ENCOUNTER — Other Ambulatory Visit: Payer: Self-pay

## 2015-12-19 DIAGNOSIS — L89151 Pressure ulcer of sacral region, stage 1: Secondary | ICD-10-CM | POA: Diagnosis not present

## 2015-12-19 DIAGNOSIS — I130 Hypertensive heart and chronic kidney disease with heart failure and stage 1 through stage 4 chronic kidney disease, or unspecified chronic kidney disease: Secondary | ICD-10-CM | POA: Diagnosis not present

## 2015-12-19 DIAGNOSIS — E1122 Type 2 diabetes mellitus with diabetic chronic kidney disease: Secondary | ICD-10-CM | POA: Diagnosis not present

## 2015-12-19 DIAGNOSIS — E43 Unspecified severe protein-calorie malnutrition: Secondary | ICD-10-CM | POA: Diagnosis not present

## 2015-12-19 DIAGNOSIS — S72001A Fracture of unspecified part of neck of right femur, initial encounter for closed fracture: Secondary | ICD-10-CM | POA: Diagnosis not present

## 2015-12-19 DIAGNOSIS — G4089 Other seizures: Secondary | ICD-10-CM | POA: Diagnosis not present

## 2015-12-19 DIAGNOSIS — Z681 Body mass index (BMI) 19 or less, adult: Secondary | ICD-10-CM | POA: Diagnosis not present

## 2015-12-19 DIAGNOSIS — F320 Major depressive disorder, single episode, mild: Secondary | ICD-10-CM | POA: Diagnosis not present

## 2015-12-19 DIAGNOSIS — I5042 Chronic combined systolic (congestive) and diastolic (congestive) heart failure: Secondary | ICD-10-CM | POA: Diagnosis not present

## 2015-12-19 LAB — GLUCOSE, CAPILLARY
GLUCOSE-CAPILLARY: 240 mg/dL — AB (ref 65–99)
GLUCOSE-CAPILLARY: 203 mg/dL — AB (ref 65–99)
GLUCOSE-CAPILLARY: 235 mg/dL — AB (ref 65–99)
GLUCOSE-CAPILLARY: 308 mg/dL — AB (ref 65–99)
Glucose-Capillary: 243 mg/dL — ABNORMAL HIGH (ref 65–99)

## 2015-12-19 LAB — BASIC METABOLIC PANEL
Anion gap: 10 (ref 5–15)
BUN: 34 mg/dL — AB (ref 6–20)
CO2: 24 mmol/L (ref 22–32)
CREATININE: 1.68 mg/dL — AB (ref 0.61–1.24)
Calcium: 8.4 mg/dL — ABNORMAL LOW (ref 8.9–10.3)
Chloride: 100 mmol/L — ABNORMAL LOW (ref 101–111)
GFR calc Af Amer: 46 mL/min — ABNORMAL LOW (ref >60)
GFR calc non Af Amer: 40 mL/min — ABNORMAL LOW (ref >60)
GLUCOSE: 190 mg/dL — AB (ref 65–99)
Potassium: 5.1 mmol/L (ref 3.5–5.1)
SODIUM: 134 mmol/L — AB (ref 135–145)

## 2015-12-19 LAB — CBC
HCT: 32.8 % — ABNORMAL LOW (ref 39.0–52.0)
HEMOGLOBIN: 10.2 g/dL — AB (ref 13.0–17.0)
MCH: 28 pg (ref 26.0–34.0)
MCHC: 31.1 g/dL (ref 30.0–36.0)
MCV: 90.1 fL (ref 78.0–100.0)
Platelets: 154 10*3/uL (ref 150–400)
RBC: 3.64 MIL/uL — ABNORMAL LOW (ref 4.22–5.81)
RDW: 15.8 % — ABNORMAL HIGH (ref 11.5–15.5)
WBC: 12.6 10*3/uL — ABNORMAL HIGH (ref 4.0–10.5)

## 2015-12-19 MED ORDER — NYSTATIN 100000 UNIT/GM EX CREA: 1.0000 "application " | TOPICAL_CREAM | Freq: Two times a day (BID) | CUTANEOUS | 0 refills | Status: DC

## 2015-12-19 MED ORDER — TRIAMCINOLONE ACETONIDE 0.5 % EX CREA: 1.0000 "application " | TOPICAL_CREAM | Freq: Two times a day (BID) | CUTANEOUS | 0 refills | Status: DC

## 2015-12-19 MED ORDER — INSULIN DETEMIR 100 UNIT/ML ~~LOC~~ SOLN
10.0000 [IU] | Freq: Every day | SUBCUTANEOUS | Status: DC
  Administered 2015-12-19: 10 [IU] via SUBCUTANEOUS
  Filled 2015-12-19 (×2): qty 0.1

## 2015-12-19 NOTE — Progress Notes (Signed)
Removed a-line per order. Patient tolerated removal well. Applied pressure for 5 minutes. No signs of bleeding. Dressing intact. Educated patient on maintain dressing for 24 hours. Will continue to monitor for bleeding at site and other changes.

## 2015-12-19 NOTE — Progress Notes (Signed)
Did not treat elevated blood sugar because the CBG was taken after patient had eaten breakfast and will not be accurate. Will continue to monitor.

## 2015-12-19 NOTE — Progress Notes (Addendum)
PROGRESS NOTE  [name] [name]  [id number] DOB: 1945-10-16 DOA: 12/16/2015 PCP: [name], MD  Brief Narrative:   [name] [name] is a 70 y.o. male with CAD, V-tach s/p PPM-ICD, ischemic cardiomyopathy with EF 15-20%, stroke, diabetes mellitus type 2, hypertension, gout, and CKD stage III who presents with a fall and hip pain.  He was hospitalized from 10/27/2015 through 11/06/2015.  He presented with confusion and vomiting.  In the ER, he had VT and his AICD fired.  He was admitted to the ICU and was found to have an NSTEMI (trop > 65), aspiration pneumonia, and Klebsiella UTI.  He underwent left heart catheterization which showed multivessel disease with a 90% stenosis of the prox LAD to mid LAD, totally occluded RCA, and a 90% stenosed distal circ.  He had a DES placed in the distal Cx on 7/11.  His echocardiogram demonstrated an EF of 15-20% with akinesis of the apical myocardium and significant LV dilation.  He was discharged to SNF where he stayed for 20 days and was discharged to home.   On the day of admission, he had a mechanical fall while trying to open a door. He was found to have a right femoral neck fracture.  Cardiology and orthopedics have been consulted.  He is moderate to high risk for surgery and cannot stop his aspirin or plavix.  Patient would like to pursue surgery.   Assessment & Plan:   Active Problems:   Essential hypertension   Chronic combined systolic and diastolic heart failure (HCC)   Type 2 diabetes mellitus with diabetic nephropathy (HCC)   Chronic renal insufficiency, stage III (moderate)   CAD, multiple vessel, RCA 100% mid occl.; LAD 99% stenosisprox.  mid of 50-60%; LCX OM-1 90%, OM-2 99%,AV groove 80%    Fracture, hip (HCC)   Pressure ulcer   Protein-calorie malnutrition, severe  Right femoral neck fracture s/p right hemiarthroplasty on 8/27 by Dr. Ronnie Derby -  Pain control -  Strict I/O and if uop decreases, will resume IVF -  Mobilize with  PT/OT  Multivessel CAD s/p NSTEMI with peak Troponin >65 s/p DES to distal Cx on 11/01/2015 -  Continue aspirin, plavix, low dose beta blocker, and statin -  Cardiology consult appreciated -  Patient has been chest free -  NTG prn  Ischemic Cardiomyopathy w/ Systolic and Diastolic CHF, followed by Dr. Haroldine Laws - 7/8 echocardiogram EF 15-20% - continue BB, digoxin -  Continue to hold diuretics until eating and drinking regularly - not on ACEI/ARB due to CKD  Ventricular Tachycardia - IACD Fired in ED 7/6 -Amiodarone 200 mg BID  Probable Partial Seizure with left arm shaking.   -  F/u with Neurology on 8/31. -  Not on seizure medication   H/O CVA - Mild persistent right-sided weakness   CKD III (baseline Cr 1.7) -  Minimize nephrotoxins and renally dose medications   Mild hyperkalemia, asymptomatic, likely related to spironolactone use in setting of CKD -  Hold spironolactone  DM Type 2 uncontrolled 7/6 Hemoglobin A1c 12.0 - hold evening Lantus - continue low dose SSI  Left foot diabetic ulcer -Left plantar surface ulcer lateral aspect. Santyl ointment BID  Multiple abrasions:  Apply vaseline and cover with dry bandage  Leukocytosis, likely related to fall -  No pneumonia on CXR -  Check UA  Normocytic anemia, likely due to CKD, hemoglobin at baseline of 10-11mg /dl.  Occult negative.  Stage 1 pressure ulcer on sacrum, present at time of  admission  Severe protein calorie malnutrition -  Supplements  -  Liberalize diet  DVT prophylaxis: heparin and SCDs Code Status: full Family Communication: patient, his wife and daughter  Disposition Plan:  likely to SNF in a few days  Consultants:   Orthopedic surgery, Dr. Ronnie Derby  [facility] Card[facility]  Procedures:  none  Antimicrobials:   perioperative   Subjective: Comfortable at rest, pain with movement in right leg.  Denies chest pain, SOB, lightheadedness   Objective: Vitals:   12/18/15 2322  12/19/15 0317 12/19/15 0935 12/19/15 1109  BP: 120/65 114/66 100/63 107/62  Pulse: 67 70 68 70  Resp: (!) 23 (!) 24 19 19   Temp: 98.5 F (36.9 C) 97.9 F (36.6 C) 97.6 F (36.4 C) 98 F (36.7 C)  TempSrc: Oral Oral Oral Oral  SpO2: 96% 96% 98% 98%  Weight:  68.9 kg (152 lb)    Height:        Intake/Output Summary (Last 24 hours) at 12/19/15 1318 Last data filed at 12/19/15 0600  Gross per 24 hour  Intake              360 ml  Output              825 ml  Net             -465 ml   Filed Weights   12/16/15 1157 12/18/15 1415 12/19/15 0317  Weight: 69.9 kg (154 lb) 68.7 kg (151 lb 7.3 oz) 68.9 kg (152 lb)    Examination:  General exam:  Adult male.  No acute distress.  Mildly confused this morning HEENT:  NCAT, MMM Respiratory system: Clear to auscultation bilaterally Cardiovascular system: Regular rate and rhythm, gallop present.  Warm extremities  Gastrointestinal system: Normal active bowel sounds, soft, nondistended, nontender. MSK:  Normal tone and bulk, trace lower extremity edema.  Right leg dressing c/d/i, minimal swelling and no bruising.  2+ pulse Neuro:  Grossly intact.  Able to wiggle toes on right foot.  SITLT right foot    Data Reviewed: I have personally reviewed following labs and imaging studies  CBC:  Recent Labs Lab 12/16/15 1335 12/17/15 0603 12/18/15 0333 12/19/15 0640  WBC 13.4* 9.6 10.6* 12.6*  NEUTROABS 11.4*  --   --   --   HGB 11.0* 9.7* 10.6* 10.2*  HCT 36.2* 32.3* 34.8* 32.8*  MCV 91.6 93.4 91.8 90.1  PLT 168 153 165 [id number]   Basic Metabolic Panel:  Recent Labs Lab 12/16/15 1335 12/17/15 0603 12/18/15 0333 12/19/15 0640  NA 142 142 137 134*  K 5.3* 4.8 4.6 5.1  CL 113* 109 106 100*  CO2 24 27 26 24   GLUCOSE 220* 101* 101* 190*  BUN 31* 27* 30* 34*  CREATININE 1.83* 1.74* 1.78* 1.68*  CALCIUM 8.8* 8.4* 8.4* 8.4*   GFR: Estimated Creatinine Clearance: 40.4 mL/min (by C-G formula based on SCr of 1.68 mg/dL). Liver Function  Tests:  Recent Labs Lab 12/16/15 1335  AST 67*  ALT 61  ALKPHOS 146*  BILITOT 0.7  PROT 7.4  ALBUMIN 3.0*   No results for input(s): LIPASE, AMYLASE in the last 168 hours. No results for input(s): AMMONIA in the last 168 hours. Coagulation Profile: No results for input(s): INR, PROTIME in the last 168 hours. Cardiac Enzymes: No results for input(s): CKTOTAL, CKMB, CKMBINDEX, TROPONINI in the last 168 hours. BNP (last 3 results) No results for input(s): PROBNP in the last 8760 hours. HbA1C: No results for input(s): HGBA1C in  the last 72 hours. CBG:  Recent Labs Lab 12/18/15 1204 12/18/15 1709 12/18/15 2108 12/19/15 0932 12/19/15 1107  GLUCAP 106* 111* 181* 240* 203*   Lipid Profile: No results for input(s): CHOL, HDL, LDLCALC, TRIG, CHOLHDL, LDLDIRECT in the last 72 hours. Thyroid Function Tests: No results for input(s): TSH, T4TOTAL, FREET4, T3FREE, THYROIDAB in the last 72 hours. Anemia Panel: No results for input(s): VITAMINB12, FOLATE, FERRITIN, TIBC, IRON, RETICCTPCT in the last 72 hours. Urine analysis:    Component Value Date/Time   COLORURINE YELLOW 10/27/2015 0900   APPEARANCEUR CLOUDY (A) 10/27/2015 0900   LABSPEC 1.028 10/27/2015 0900   PHURINE 5.0 10/27/2015 0900   GLUCOSEU >1000 (A) 10/27/2015 0900   HGBUR SMALL (A) 10/27/2015 0900   BILIRUBINUR negative 11/28/2015 1442   KETONESUR NEGATIVE 10/27/2015 0900   PROTEINUR positive 11/28/2015 1442   PROTEINUR NEGATIVE 10/27/2015 0900   UROBILINOGEN negative 11/28/2015 1442   UROBILINOGEN 0.2 09/06/2011 0908   NITRITE negative 11/28/2015 1442   NITRITE POSITIVE (A) 10/27/2015 0900   LEUKOCYTESUR moderate (2+) (A) 11/28/2015 1442   Sepsis Labs: @LABRCNTIP (procalcitonin:4,lacticidven:4)  ) Recent Results (from the past 240 hour(s))  MRSA PCR Screening     Status: None   Collection Time: 12/18/15  4:51 AM  Result Value Ref Range Status   MRSA by PCR NEGATIVE NEGATIVE Final    Comment:        The  GeneXpert MRSA Assay (FDA approved for NASAL specimens only), is one component of a comprehensive MRSA colonization surveillance program. It is not intended to diagnose MRSA infection nor to guide or monitor treatment for MRSA infections.       Radiology Studies: No results found.   Scheduled Meds: . amiodarone  200 mg Oral Daily  . aspirin EC  81 mg Oral Daily  . carvedilol  1.5625 mg Oral BID WC  . clopidogrel  75 mg Oral Daily  . collagenase   Topical BID  . digoxin  0.125 mg Oral Daily  . docusate sodium  100 mg Oral BID  . insulin aspart  0-9 Units Subcutaneous TID WC  . insulin detemir  10 Units Subcutaneous QHS  . mirtazapine  15 mg Oral QHS  . rosuvastatin  40 mg Oral Daily  . senna  1 tablet Oral BID  . tamsulosin  0.4 mg Oral Daily  . Tdap  0.5 mL Intramuscular Once   Continuous Infusions:    LOS: 3 days    Time spent: 30 min    Janece Canterbury, MD [facility] Pager [phone]  If 7PM-7AM, please contact night-coverage [email or web address] Password [email or web address] 12/19/2015, 1:18 PM

## 2015-12-19 NOTE — Care Management Important Message (Signed)
Important Message  Patient Details  Name: [name] [name] MRN: [id number] Date of Birth: 1945/06/02   Medicare Important Message Given:  Yes    Nathen May 12/19/2015, 12:11 PM

## 2015-12-19 NOTE — Care Management Note (Signed)
Case Management Note  Patient Details  Name: [name] [name] MRN: [id number] Date of Birth: 08-22-1945  Subjective/Objective:     Patient is from home with spouse, wife states they are active with [facility], but patient has also been in snf at [facility] before also. Await pt eval.   NCM will cont to follow for dc needs.                Action/Plan:   Expected Discharge Date:                  Expected Discharge Plan:  [place]  In-House Referral:     Discharge planning Services  CM Consult  Post Acute Care Choice:    Choice offered to:     DME Arranged:    DME Agency:     HH Arranged:    [place] Agency:     Status of Service:  In process, will continue to follow  If discussed at Long Length of Stay Meetings, dates discussed:    Additional Comments:  Zenon Mayo, RN 12/19/2015, 5:26 PM

## 2015-12-19 NOTE — Consult Note (Signed)
   [facility] Inpatient Consult   12/19/2015  [name] [name] Mar 26, 1946 [id number]  Patient is currently active with [facility] for chronic disease management services.Patient is a 70 y.o. male presented with a history of pain in the right hip. Onset of symptoms was abrupt starting a few days ago with unchanged course since that time. Patient has been treated conservatively with over-the-counter NSAIDs and activity modification. Patient currently rates pain in the hip at 10 out of 10 with activity.  HX with CAD, V-tach s/p PPM-ICD, ischemic cardiomyopathy with EF 15-20%, stroke, diabetes mellitus type 2, hypertension, gout, and CKD stage III who presents with a fall and hip pain. Fracture of Hip now post op.    Patient has been engaged by a [employer] and HX with CSW.  Active consent on file.  Made Inpatient Case Manager aware that [facility] following. Will follow for disposition and ongoing [facility] needs.  For additional questions or referrals please contact:  Natividad Brood, RN BSN [facility] Liaison  [phone] business mobile phone Toll free office [phone]

## 2015-12-19 NOTE — Progress Notes (Signed)
Patient Name: [name] [name] Date of Encounter: 12/19/2015  Hospital Problem List     Active Problems:   Essential hypertension   Chronic combined systolic and diastolic heart failure (HCC)   Type 2 diabetes mellitus with diabetic nephropathy (HCC)   Chronic renal insufficiency, stage III (moderate)   CAD, multiple vessel, RCA 100% mid occl.; LAD 99% stenosisprox.  mid of 50-60%; LCX OM-1 90%, OM-2 99%,AV groove 80%    Fracture, hip (HCC)   Pressure ulcer   Protein-calorie malnutrition, severe    Patient Profile     Patient is a 70 y.o. male with a PMHx of Coronary artery disease-congestive heart failure, diabetes mellitus, chronic kidney disease stage III, who was admitted to [facility] on 12/16/2015 for evaluation of fracture of the right femoral neck.  The patient has a history of an anterior wall myocardial infarction 2005.  He has chronic systolic congestive heart failure with an ejection fraction 15%. He also has a history of essential hypertension and a CVA in 2015.  Subjective   No SOB.    Inpatient Medications    . amiodarone  200 mg Oral Daily  . aspirin EC  81 mg Oral Daily  . carvedilol  1.5625 mg Oral BID WC  . clopidogrel  75 mg Oral Daily  . collagenase   Topical BID  . digoxin  0.125 mg Oral Daily  . docusate sodium  100 mg Oral BID  . insulin aspart  0-9 Units Subcutaneous TID WC  . insulin detemir  10 Units Subcutaneous QHS  . mirtazapine  15 mg Oral QHS  . rosuvastatin  40 mg Oral Daily  . senna  1 tablet Oral BID  . tamsulosin  0.4 mg Oral Daily  . Tdap  0.5 mL Intramuscular Once    Vital Signs    Vitals:   12/18/15 1915 12/18/15 2322 12/19/15 0317 12/19/15 0935  BP: 112/70 120/65 114/66 100/63  Pulse: 63 67 70 68  Resp: (!) 23 (!) 23 (!) 24 19  Temp: 98 F (36.7 C) 98.5 F (36.9 C) 97.9 F (36.6 C) 97.6 F (36.4 C)  TempSrc: Oral Oral Oral Oral  SpO2: 94% 96% 96% 98%  Weight:   152 lb (68.9 kg)   Height:        Intake/Output Summary  (Last 24 hours) at 12/19/15 1101 Last data filed at 12/19/15 0600  Gross per 24 hour  Intake             1110 ml  Output              825 ml  Net              285 ml   Filed Weights   12/16/15 1157 12/18/15 1415 12/19/15 0317  Weight: 154 lb (69.9 kg) 151 lb 7.3 oz (68.7 kg) 152 lb (68.9 kg)    Physical Exam    GEN: Well nourished, well developed, in no no acute distress, chronically ill.  Neck: Supple, no JVD, carotid bruits, or masses. Cardiac: RRR, systolic murmur, no rubs, or gallops. No clubbing, cyanosis, no edema.  Radials/DP/PT 2+ and equal bilaterally.  Respiratory:  Respirations  regular and unlabored, clear to auscultation bilaterally. GI: Soft, nontender, nondistended, BS + x 4. Neuro:  Strength and sensation are intact.   Labs    CBC  Recent Labs  12/16/15 1335  12/18/15 0333 12/19/15 0640  WBC 13.4*  < > 10.6* 12.6*  NEUTROABS 11.4*  --   --   --  HGB 11.0*  < > 10.6* 10.2*  HCT 36.2*  < > 34.8* 32.8*  MCV 91.6  < > 91.8 90.1  PLT 168  < > 165 154  < > = values in this interval not displayed. Basic Metabolic Panel  Recent Labs  12/18/15 0333 12/19/15 0640  NA 137 134*  K 4.6 5.1  CL 106 100*  CO2 26 24  GLUCOSE 101* 190*  BUN 30* 34*  CREATININE 1.78* 1.68*  CALCIUM 8.4* 8.4*   Liver Function Tests  Recent Labs  12/16/15 1335  AST 67*  ALT 61  ALKPHOS 146*  BILITOT 0.7  PROT 7.4  ALBUMIN 3.0*   No results for input(s): LIPASE, AMYLASE in the last 72 hours. Cardiac Enzymes No results for input(s): CKTOTAL, CKMB, CKMBINDEX, TROPONINI in the last 72 hours. BNP Invalid input(s): POCBNP D-Dimer No results for input(s): DDIMER in the last 72 hours. Hemoglobin A1C No results for input(s): HGBA1C in the last 72 hours. Fasting Lipid Panel No results for input(s): CHOL, HDL, LDLCALC, TRIG, CHOLHDL, LDLDIRECT in the last 72 hours. Thyroid Function Tests No results for input(s): TSH, T4TOTAL, T3FREE, THYROIDAB in the last 72  hours.  Invalid input(s): FREET3  Telemetry    NSR  ECG    NA  Radiology    Dg Chest 2 View  Result Date: 12/16/2015 CLINICAL DATA:  Fall yesterday, hip fracture. EXAM: CHEST  2 VIEW COMPARISON:  Chest x-rays dated 11/28/2015 and 11/04/2015. FINDINGS: Mild cardiomegaly is stable. Left chest wall pacemaker/ICD in place. Lungs are clear. No evidence of pneumonia. No pleural effusion or pneumothorax seen. Osseous structures about the chest are unremarkable. IMPRESSION: No active cardiopulmonary disease.  Stable mild cardiomegaly. Electronically Signed   By: Franki Cabot M.D.   On: 12/16/2015 14:22   Dg Chest 2 View  Result Date: 11/28/2015 CLINICAL DATA:  Productive cough. Low-grade fever for the past 4 days. EXAM: CHEST  2 VIEW COMPARISON:  11/04/2015. FINDINGS: Normal sized heart. Clear lungs. The interstitial markings are mildly prominent. Stable left subclavian AICD lead. Unremarkable bones. IMPRESSION: No acute abnormality.  Mild chronic interstitial lung disease. Electronically Signed   By: Claudie Revering M.D.   On: 11/28/2015 17:17   Ct Head Wo Contrast  Result Date: 12/16/2015 CLINICAL DATA:  fall fall at or. EXAM: CT HEAD WITHOUT CONTRAST TECHNIQUE: Contiguous axial images were obtained from the base of the skull through the vertex without intravenous contrast. COMPARISON:  CT 11/21/2015 FINDINGS: Brain: No acute intracranial hemorrhage. No focal mass lesion. No CT evidence of acute infarction. No midline shift or mass effect. No hydrocephalus. Basilar cisterns are patent. There are periventricular and subcortical white matter hypodensities. Generalized cortical atrophy. Vascular: Unremarkable Skull: No calvarial lesion Sinuses/Orbits: Paranasal sinuses and mastoid air cells are clear. Orbits are clear. Other: None IMPRESSION: 1. No acute intracranial findings. 2. Atrophy and chronic white matter microvascular disease. Electronically Signed   By: Suzy Bouchard M.D.   On: 12/16/2015  13:56   Ct Head Wo Contrast  Result Date: 11/21/2015 CLINICAL DATA:  Left leg weakness and left foot drop. Right-sided weakness. Involuntary movements. Symptoms for 3 weeks. EXAM: CT HEAD WITHOUT CONTRAST TECHNIQUE: Contiguous axial images were obtained from the base of the skull through the vertex without intravenous contrast. COMPARISON:  10/27/2015 FINDINGS: There is no evidence of acute cortical infarct, intracranial hemorrhage, mass, midline shift, or extra-axial fluid collection. Mild-to-moderate cerebral atrophy is unchanged. Patchy hypodensities in the cerebral white matter bilaterally are unchanged and nonspecific  but compatible with moderate chronic small vessel ischemic disease. Orbits are unremarkable. The visualized paranasal sinuses and mastoid air cells are clear. No acute osseous abnormality is identified. Calcified atherosclerosis is noted at the skullbase, and there is unchanged vascular calcification at the anterior aspect of the right sylvian fissure. IMPRESSION: 1. No evidence of acute intracranial abnormality. 2. Moderate chronic small vessel ischemic disease and cerebral atrophy. Electronically Signed   By: Logan Bores M.D.   On: 11/21/2015 12:46  Dg Hip Unilat  With Pelvis 2-3 Views Right  Result Date: 12/16/2015 CLINICAL DATA:  [place] last night.  Right hip pain. EXAM: DG HIP (WITH OR WITHOUT PELVIS) 2-3V RIGHT COMPARISON:  None. FINDINGS: There is a fracture of the right femoral neck. Small comminuted fracture components are noted. Primary fracture components are displaced, with the shaft component migrating superiorly by approximately 2 cm. There is mild varus angulation and mild apex anterior angulation. No other fractures. Hip joints are normally aligned as are the SI joints and symphysis pubis. Bones are demineralized. IMPRESSION: Displaced, mildly angulated, right femoral neck fracture. Electronically Signed   By: Lajean Manes M.D.   On: 12/16/2015 12:55    Assessment & Plan     CAD:  No evidence of active ischemia post op.  Continue current therapy.  ISCHEMIC CARDIOMYOPATHY:  Seems to be euvolemic.   Continue current therapy.   We will follow as needed.    Signed, Minus Breeding, MD  12/19/2015, 11:01 AM

## 2015-12-19 NOTE — Progress Notes (Signed)
SPORTS MEDICINE AND JOINT REPLACEMENT  Lara Mulch, MD    Carlyon Shadow, PA-C [address], [place], [place]  [address]                             [phone]   PROGRESS NOTE  Subjective:  negative for Chest Pain  negative for Shortness of Breath  negative for Nausea/Vomiting   negative for Calf Pain  negative for Bowel Movement   Tolerating Diet: yes         Patient reports pain as 5 on 0-10 scale.    Objective: Vital signs in last 24 hours:   Patient Vitals for the past 24 hrs:  BP Temp Temp src Pulse Resp SpO2 Weight  12/19/15 1827 114/71 - - 74 - - -  12/19/15 1534 (!) 102/57 99.2 F (37.3 C) Oral 76 17 98 % -  12/19/15 1109 107/62 98 F (36.7 C) Oral 70 19 98 % -  12/19/15 0935 100/63 97.6 F (36.4 C) Oral 68 19 98 % -  12/19/15 0317 114/66 97.9 F (36.6 C) Oral 70 (!) 24 96 % 68.9 kg (152 lb)  12/18/15 2322 120/65 98.5 F (36.9 C) Oral 67 (!) 23 96 % -  12/18/15 1915 112/70 98 F (36.7 C) Oral 63 (!) 23 94 % -    @flow {1959:LAST@   Intake/Output from previous day:   08/27 0701 - 08/28 0700 In: 1110 [P.O.:360; I.V.:500] Out: 1025 [Urine:825]   Intake/Output this shift:   No intake/output data recorded.   Intake/Output      08/28 0701 - 08/29 0700   P.O.    I.V. (mL/kg)    IV Piggyback    Total Intake(mL/kg)    Urine (mL/kg/hr) 100 (0.1)   Blood    Total Output 100   Net -100          LABORATORY DATA:  Recent Labs  12/16/15 1335 12/17/15 0603 12/18/15 0333 12/19/15 0640  WBC 13.4* 9.6 10.6* 12.6*  HGB 11.0* 9.7* 10.6* 10.2*  HCT 36.2* 32.3* 34.8* 32.8*  PLT 168 153 165 154    Recent Labs  12/16/15 1335 12/17/15 0603 12/18/15 0333 12/19/15 0640  NA 142 142 137 134*  K 5.3* 4.8 4.6 5.1  CL 113* 109 106 100*  CO2 24 27 26 24   BUN 31* 27* 30* 34*  CREATININE 1.83* 1.74* 1.78* 1.68*  GLUCOSE 220* 101* 101* 190*  CALCIUM 8.8* 8.4* 8.4* 8.4*   Lab Results  Component Value Date   INR 1.20 10/27/2015   INR 1.10  10/27/2015   INR 0.99 12/09/2013    Examination:  General appearance: alert, cooperative and no distress Extremities: extremities normal, atraumatic, no cyanosis or edema  Wound Exam: clean, dry, intact   Drainage:  None: wound tissue dry  Motor Exam: Quadriceps and Hamstrings Intact  Sensory Exam: Superficial Peroneal, Deep Peroneal and Tibial normal   Assessment:    1 Day Post-Op  Procedure(s) (LRB): ARTHROPLASTY BIPOLAR HIP (HEMIARTHROPLASTY) (Right)  ADDITIONAL DIAGNOSIS:  Active Problems:   Essential hypertension   Chronic combined systolic and diastolic heart failure (HCC)   Type 2 diabetes mellitus with diabetic nephropathy (HCC)   Chronic renal insufficiency, stage III (moderate)   CAD, multiple vessel, RCA 100% mid occl.; LAD 99% stenosisprox.  mid of 50-60%; LCX OM-1 90%, OM-2 99%,AV groove 80%    Fracture, hip (HCC)   Pressure ulcer   Protein-calorie malnutrition, severe  Acute  Blood Loss Anemia   Plan: Physical Therapy as ordered Weight Bearing as Tolerated (WBAT)  DVT Prophylaxis:  Lovenox  DISCHARGE PLAN: Skilled Nursing Facility/Rehab  DISCHARGE NEEDS: HHPT   Patient doing very well from an orthopedic standpoint. He is WBAT and may progress as tolerated. Will continue to follow him until hospitalist allows for D/C. Will continue to talk to patient and family about SNF vs home health at D/C.          Donia Ast 12/19/2015, 7:08 PM

## 2015-12-19 NOTE — Op Note (Signed)
NAMEBENJAMAN, AZOULAY NO.:  [id number]  MEDICAL RECORD NO.:  [id number]  LOCATION:  [facility]                         FACILITY:  [facility]  PHYSICIAN:  Estill Bamberg. Ronnie Derby, M.D. DATE OF BIRTH:  [birth year]  DATE OF PROCEDURE:  12/18/2015 DATE OF DISCHARGE:                              OPERATIVE REPORT   SURGEON:  Estill Bamberg. Ronnie Derby, MD  ASSISTANT:  Carlyon Shadow, PA-C  ANESTHESIA:  General.  PREOPERATIVE DIAGNOSIS:  Right hip femoral neck fracture.  POSTOPERATIVE DIAGNOSIS:  Right hip femoral neck fracture.  PROCEDURE:  Right hip hemiarthroplasty.  INDICATION FOR PROCEDURE:  Patient is a 70 year old white male in significantly poor health with recent stent placement.  He fell and suffered a femoral neck fracture, admitted to the hospitalist service and I was consulted for his orthopedic care.  After informed consent, Which clearance took 2 days for the family to decide to have surgery. He went to the operating room for hemiarthroplasty.  DESCRIPTION OF PROCEDURE:  The patient was laid supine, after administered general anesthesia, then placed in the left hip down, right hip up, lateral decubitus position.  Bony prominences were well padded. The right hip was prepped and draped in the usual fashion.  A curvilinear incision was made, centered over the IT band.  I then cauterized all bleeding vessels.  I placed the cobra retractors anteriorly, identified the short external rotators, tagged those and removed those from the posterior hip capsule.  I then T'd the posterior hip capsule and delivered the fractured humeral head, measured a size 53 mm.  I then put a 53 trial in place, had good suction and chose that head.  I then used the reciprocating saw to make a fresh cut on the femur and then reamed to a size 10 with the broach.  I attached the 53 mm head and reduced the hip and it was very stable.  Leg lengths were good.  I then removed the trial components.  I copiously  irrigated and tapped down the partial porous coated fracture stem and tamped onto a clean Morse taper, a size 53 monoblock head.  I then reduced the hip.  I closed the hip capsule and repaired the short external rotators to the piriformis fossa through the soft tissue.  I then ran a #1 Vicryl stitch in the fascia lata and then buried 0, then 2-0 Vicryl in subcutaneous layers and then skin staples.  Dressed with Aquacel.  Patient tolerated the procedure well.  COMPLICATIONS:  None.  DRAINS:  None.  EBL:  200 mL.          ______________________________ Estill Bamberg. Ronnie Derby, M.D.     SDL/MEDQ  D:  12/18/2015  T:  12/18/2015  Job:  [id number]

## 2015-12-20 ENCOUNTER — Telehealth (HOSPITAL_COMMUNITY): Payer: Self-pay | Admitting: Vascular Surgery

## 2015-12-20 ENCOUNTER — Telehealth: Payer: Self-pay | Admitting: Family Medicine

## 2015-12-20 LAB — CBC
HEMATOCRIT: 31.9 % — AB (ref 39.0–52.0)
Hemoglobin: 9.9 g/dL — ABNORMAL LOW (ref 13.0–17.0)
MCH: 28 pg (ref 26.0–34.0)
MCHC: 31 g/dL (ref 30.0–36.0)
MCV: 90.4 fL (ref 78.0–100.0)
Platelets: 155 10*3/uL (ref 150–400)
RBC: 3.53 MIL/uL — ABNORMAL LOW (ref 4.22–5.81)
RDW: 15.7 % — AB (ref 11.5–15.5)
WBC: 12.5 10*3/uL — ABNORMAL HIGH (ref 4.0–10.5)

## 2015-12-20 LAB — BASIC METABOLIC PANEL
Anion gap: 9 (ref 5–15)
BUN: 41 mg/dL — ABNORMAL HIGH (ref 6–20)
CALCIUM: 8.2 mg/dL — AB (ref 8.9–10.3)
CO2: 23 mmol/L (ref 22–32)
CREATININE: 1.81 mg/dL — AB (ref 0.61–1.24)
Chloride: 102 mmol/L (ref 101–111)
GFR calc non Af Amer: 36 mL/min — ABNORMAL LOW (ref >60)
GFR, EST AFRICAN AMERICAN: 42 mL/min — AB (ref >60)
Glucose, Bld: 181 mg/dL — ABNORMAL HIGH (ref 65–99)
Potassium: 4.7 mmol/L (ref 3.5–5.1)
SODIUM: 134 mmol/L — AB (ref 135–145)

## 2015-12-20 LAB — GLUCOSE, CAPILLARY
GLUCOSE-CAPILLARY: 318 mg/dL — AB (ref 65–99)
Glucose-Capillary: 157 mg/dL — ABNORMAL HIGH (ref 65–99)
Glucose-Capillary: 251 mg/dL — ABNORMAL HIGH (ref 65–99)
Glucose-Capillary: 215 mg/dL — ABNORMAL HIGH (ref 65–99)

## 2015-12-20 MED ORDER — INSULIN ASPART 100 UNIT/ML ~~LOC~~ SOLN
4.0000 [IU] | Freq: Three times a day (TID) | SUBCUTANEOUS | Status: DC
  Administered 2015-12-20 – 2015-12-22 (×5): 4 [IU] via SUBCUTANEOUS

## 2015-12-20 MED ORDER — INSULIN DETEMIR 100 UNIT/ML ~~LOC~~ SOLN
15.0000 [IU] | Freq: Every day | SUBCUTANEOUS | Status: DC
  Administered 2015-12-20 – 2015-12-21 (×2): 15 [IU] via SUBCUTANEOUS
  Filled 2015-12-20 (×3): qty 0.15

## 2015-12-20 NOTE — Clinical Social Work Placement (Signed)
   CLINICAL SOCIAL WORK PLACEMENT  NOTE  Date:  12/20/2015  Patient Details  Name: [name] [name] MRN: [id number] Date of Birth: 06-01-1945  Clinical Social Work is seeking post-discharge placement for this patient at the [place] level of care (*CSW will initial, date and re-position this form in  chart as items are completed):  Yes   Patient/family provided with [facility] Work Department's list of facilities offering this level of care within the geographic area requested by the patient (or if unable, by the patient's family).  Yes   Patient/family informed of their freedom to choose among providers that offer the needed level of care, that participate in Medicare, Medicaid or managed care program needed by the patient, have an available bed and are willing to accept the patient.  Yes   Patient/family informed of [facility]'s ownership interest in [facility] and [facility], as well as of the fact that they are under no obligation to receive care at these facilities.  PASRR submitted to EDS on 12/20/15     PASRR number received on       Existing PASRR number confirmed on 12/20/15     FL2 transmitted to all facilities in geographic area requested by pt/family on 12/20/15     FL2 transmitted to all facilities within larger geographic area on       Patient informed that his/her managed care company has contracts with or will negotiate with certain facilities, including the following:            Patient/family informed of bed offers received.  Patient chooses bed at       Physician recommends and patient chooses bed at      Patient to be transferred to   on  .  Patient to be transferred to facility by       Patient family notified on   of transfer.  Name of family member notified:        PHYSICIAN Please sign FL2     Additional Comment:    _______________________________________________ Candie Chroman, LCSW 12/20/2015,  3:56 PM

## 2015-12-20 NOTE — Clinical Social Work Note (Signed)
Clinical Social Work Assessment  Patient Details  Name: [name] [name] MRN: [id number] Date of Birth: Aug 22, 1945  Date of referral:  12/20/15               Reason for consult:  Facility Placement, Discharge Planning                Permission sought to share information with:  Facility Sport and exercise psychologist, Family Supports Permission granted to share information::  Yes, Verbal Permission Granted  Name::     [name] [name]  Agency::  SNF's  Relationship::  Wife  Contact Information:  [phone]  Housing/Transportation Living arrangements for the past 2 months:  [place], [place] of Information:  Medical Team, Spouse Patient Interpreter Needed:  None Criminal Activity/Legal Involvement Pertinent to Current Situation/Hospitalization:  No - Comment as needed Significant Relationships:  Adult Children, Spouse Lives with:  Spouse Do you feel safe going back to the place where you live?  Yes Need for family participation in patient care:  Yes (Comment)  Care giving concerns:  PT recommending SNF once patient medically stable for discharge.   Social Worker assessment / plan:  CSW met with patient. Wife at bedside. Patient only briefly opened his eyes then closed them again. CSW introduced role and explained that discharge planning would be discussed. Patient's wife agreeable to SNF placement and wants him to return to [facility]. He discharged from there two weeks ago. Deirdre Pippins, admissions coordinator notified. Patient's wife unsure if he will need PTAR or if she will transport. Will decide on day of discharge. No further concerns. CSW encouraged patient's wife to contact CSW as needed. CSW will continue to follow patient and his wife for support and facilitate discharge to SNF once medically stable.  Employment status:  Retired Nurse, adult PT Recommendations:  [place] / Referral to community  resources:  [place]  Patient/Family's Response to care:  Patient asleep. Patient's wife agreeable to SNF placement. Patient's family supportive and involved in patient's care. Patient's wife appreciated social work intervention.  Patient/Family's Understanding of and Emotional Response to Diagnosis, Current Treatment, and Prognosis:  Patient asleep. Patient's wife understands need for return to rehab prior to going home. Patient's wife appears happy with hospital care.  Emotional Assessment Appearance:  Appears stated age Attitude/Demeanor/Rapport:  Unable to Assess Affect (typically observed):  Unable to Assess Orientation:  Oriented to Self, Oriented to Place, Oriented to  Time, Oriented to Situation Alcohol / Substance use:  Never Used Psych involvement (Current and /or in the community):  No (Comment)  Discharge Needs  Concerns to be addressed:  Care Coordination Readmission within the last 30 days:  No Current discharge risk:  Dependent with Mobility Barriers to Discharge:  [place], LCSW 12/20/2015, 3:52 PM

## 2015-12-20 NOTE — Evaluation (Signed)
Physical Therapy Evaluation Patient Details Name: [name] [name] MRN: [id number] DOB: June 09, 1945 Today's Date: 12/20/2015   History of Present Illness  70 yo admitted after fall at home with Rt hip fx s/p hemiarthroplasty. Pt with recent D/C home from SNF after admission in july for NSTEMI. PMHx: pacemaker, CVA, DM, HTN, heart failure, CAD  Clinical Impression  [name] is pleasant and states desire to return home however, unable to recall precautions and required extensive assist with all mobility at this time. Pt educated for posterior precautions, transfers, DME use and HEP. Pt with decreased strength, ROM, transfers and inability to ambulate who will benefit from acute therapy to maximize mobility, function and strength to decrease burden of care.     Follow Up Recommendations Supervision/Assistance - 24 hour;SNF    Equipment Recommendations  3in1 (PT)    Recommendations for Other Services OT consult     Precautions / Restrictions Precautions Precautions: Posterior Hip;Fall Precaution Booklet Issued: Yes (comment) Restrictions Weight Bearing Restrictions: No RLE Weight Bearing: Weight bearing as tolerated      Mobility  Bed Mobility Overal bed mobility: Needs Assistance Bed Mobility: Supine to Sit;Sit to Supine     Supine to sit: Mod assist Sit to supine: Mod assist   General bed mobility comments: cues for sequence with assist to elevate trunk and to bring legs off of bed, return to bed assist to bring legs onto surface. Pt able to scoot to [facility] in [place] with cues  Transfers Overall transfer level: Needs assistance   Transfers: Sit to/from Stand Sit to Stand: Max assist;+2 physical assistance;From elevated surface         General transfer comment: pt able to stand from elevated bed initial trial with cues and mod assist. With weight shifting pt Rt LE buckled and assist to land on surface. 2 repeated trials with 2 person max assist and unable to achieve  full standing on repeated trials. Pt transferred back to bed for safety and room transfer.   Ambulation/Gait                Stairs            Wheelchair Mobility    Modified Rankin (Stroke Patients Only)       Balance Overall balance assessment: Needs assistance   Sitting balance-Leahy Scale: Fair       Standing balance-Leahy Scale: Poor                               Pertinent Vitals/Pain Pain Assessment: 0-10 Pain Score: 4  Pain Location: right hip Pain Descriptors / Indicators: Aching Pain Intervention(s): Limited activity within patient's tolerance;Monitored during session;Repositioned    Home Living Family/patient expects to be discharged to:: Private residence Living Arrangements: Spouse/significant other Available Help at Discharge: Family;Available 24 hours/day Type of Home: House Home Access: Stairs to enter Entrance Stairs-Rails: Right Entrance Stairs-Number of Steps: 2 Home Layout: One level Home Equipment: Walker - 2 wheels;Cane - single point;Tub bench Additional Comments: aide for bathing at home    Prior Function Level of Independence: Independent with assistive device(s)               Hand Dominance        Extremity/Trunk Assessment   Upper Extremity Assessment: Generalized weakness           Lower Extremity Assessment: Generalized weakness;RLE deficits/detail RLE Deficits / Details: decreased strength post op and inability to bear  weight    Cervical / Trunk Assessment: Kyphotic  Communication   Communication: No difficulties  Cognition Arousal/Alertness: Awake/alert Behavior During Therapy: Flat affect Overall Cognitive Status: No family/caregiver present to determine baseline cognitive functioning       Memory: Decreased recall of precautions;Decreased short-term memory              General Comments      Exercises General Exercises - Lower Extremity Heel Slides: AAROM;Right;Supine Hip  ABduction/ADduction: AROM;Right;5 reps;Supine      Assessment/Plan    PT Assessment Patient needs continued PT services  PT Diagnosis Difficulty walking;Generalized weakness;Acute pain   PT Problem List Decreased strength;Decreased mobility;Decreased safety awareness;Decreased range of motion;Decreased knowledge of precautions;Decreased activity tolerance;Decreased skin integrity;Pain;Decreased balance;Decreased knowledge of use of DME  PT Treatment Interventions Gait training;Functional mobility training;Balance training;Therapeutic exercise;Patient/family education;Therapeutic activities;DME instruction   PT Goals (Current goals can be found in the Care Plan section) Acute Rehab PT Goals Patient Stated Goal: return to playing golf PT Goal Formulation: With patient Time For Goal Achievement: 01/03/16 Potential to Achieve Goals: Fair    Frequency Min 3X/week   Barriers to discharge Decreased caregiver support      Co-evaluation               End of Session Equipment Utilized During Treatment: Gait belt Activity Tolerance: Patient tolerated treatment well Patient left: in bed;with call bell/phone within reach;with nursing/sitter in room Nurse Communication: Mobility status;Precautions;Weight bearing status         Time: [id number] PT Time Calculation (min) (ACUTE ONLY): 29 min   Charges:   PT Evaluation $PT Eval Moderate Complexity: 1 Procedure PT Treatments $Therapeutic Activity: 8-22 mins   PT G CodesMelford Aase 12/20/2015, 10:34 AM Elwyn Reach, [place]

## 2015-12-20 NOTE — NC FL2 (Signed)
[place] MEDICAID FL2 LEVEL OF CARE SCREENING TOOL     IDENTIFICATION  Patient Name: [name] [name] Birthdate: 1945-08-21 Sex: male Admission Date (Current Location): 12/16/2015  [facility] [place] Number:  Herbalist and Address:  [facility] [place], [place] [address] [place], [place] [address]      Provider Number: [id number]  Attending Physician Name and Address:  Janece Canterbury, MD  Relative Name and Phone Number:       Current Level of Care: Hospital Recommended Level of Care: [place] Prior Approval Number:    Date Approved/Denied:   PASRR Number: [id number]  Discharge Plan: SNF    Current Diagnoses: Patient Active Problem List   Diagnosis Date Noted  . Pressure ulcer 12/17/2015  . Protein-calorie malnutrition, severe 12/17/2015  . Fracture, hip ([place]) 12/16/2015  . Depression, major, single episode, mild ([place]) 12/13/2015  . Left foot drop 11/21/2015  . Diabetic polyneuropathy associated with type 2 diabetes mellitus ([place]) 11/21/2015  . Productive cough   . Acute on chronic kidney failure ([place])   . Diabetic ulcer of left foot associated with type 2 diabetes mellitus ([place])   . Aspiration pneumonia of right upper lobe due to vomit ([place])   . Hyperglycemia   . Type 2 diabetes mellitus with diabetic nephropathy, with long-term current use of insulin ([place])   . Type 2 diabetes mellitus with left diabetic foot ulcer ([place])   . Systolic and diastolic CHF, acute on chronic ([place])   . Klebsiella infection   . Aspiration pneumonia of right lower lobe due to vomit ([place])   . Systolic and diastolic CHF, chronic ([place])   . HLD (hyperlipidemia)   . Acute encephalopathy   . Acute renal failure superimposed on stage 3 chronic kidney disease ([place])   . Uncontrolled type 2 diabetes mellitus with complication ([place])   . Respiratory failure ([place]) 10/27/2015  . Ventricular tachycardia ([place])   . Acute respiratory failure with hypoxia ([place])    . NSTEMI (non-ST elevated myocardial infarction) ([place])   . Encounter for central line placement   . Acute on chronic respiratory failure with hypoxia ([place])   . Acute on chronic diastolic congestive heart failure ([place])   . PAH (pulmonary artery hypertension) ([place])   . CKD (chronic kidney disease) stage 3, GFR 30-59 ml/min 08/16/2015  . Acute on chronic systolic and diastolic heart failure, NYHA class 4 ([name]) 07/26/2015  . VF (ventricular fibrillation) ([place]) 05/20/2015  . Acute on chronic systolic heart failure ([place]) 04/13/2015  . UTI (lower urinary tract infection) 04/10/2015  . Syncope 04/10/2015  . Fatigue, possible anginal equivalent  12/31/2013  . CAD, multiple vessel, RCA 100% mid occl.; LAD 99% stenosisprox.  mid of 50-60%; LCX OM-1 90%, OM-2 99%,AV groove 80%  12/10/2013  . MVP (mitral valve prolapse) 12/01/2013  . Proteinuria 02/25/2013  . Type 2 diabetes mellitus with diabetic nephropathy ([place]) 02/25/2013  . Chronic renal insufficiency, stage III (moderate) 02/25/2013  . Automatic implantable cardioverter-defibrillator in situ 05/06/2012  . Polyneuropathy due to secondary diabetes mellitus ([place]) 04/21/2012  . Urinary outflow obstruction 12/25/2011  . Dysarthria 09/06/2011  . Chronic combined systolic and diastolic heart failure ([place]) 10/11/2009  . GOUT 10/07/2007  . Type II diabetes mellitus with neurological manifestations, uncontrolled ([place]) 08/12/2007  . HYPERLIPIDEMIA 04/11/2007  . Ischemic cardiomyopathy 04/11/2007  . Obstructive sleep apnea 02/27/2007  . Essential hypertension 02/27/2007  . Old Anterior MI (myocardial infarction) 02/27/2007    Orientation RESPIRATION BLADDER Height &  Weight     Self, Time, Situation, Place (But confused)  Normal Continent Weight: 152 lb 14.4 oz (69.4 kg) (w/ trapeze bar) Height:  6\' 2"  (188 cm)  BEHAVIORAL SYMPTOMS/MOOD NEUROLOGICAL BOWEL NUTRITION STATUS   (None)  (None) Continent Diet (Heart healthy/carb modified)  AMBULATORY  STATUS COMMUNICATION OF NEEDS Skin   Extensive Assist Verbally Skin abrasions, Other (Comment), PU Stage and Appropriate Care (Blister, Contact dermatitis, Skin tear, diabetic ulcer) PU Stage 1 Dressing:  (Lower sacrum. Foam prn.)                     Personal Care Assistance Level of Assistance  Bathing, Feeding, Dressing Bathing Assistance: Limited assistance Feeding assistance: Limited assistance Dressing Assistance: Limited assistance     Functional Limitations Info  Sight, Hearing, Speech Sight Info: Adequate Hearing Info: Adequate Speech Info: Adequate    SPECIAL CARE FACTORS FREQUENCY  Blood pressure, Diabetic urine testing, PT (By licensed PT), OT (By licensed OT)     PT Frequency: 5 x week OT Frequency: 5 x week            Contractures Contractures Info: Not present    Additional Factors Info  Code Status, Allergies Code Status Info: Full Allergies Info: Sulfa antibiotics           Current Medications (12/20/2015):  This is the current hospital active medication list Current Facility-Administered Medications  Medication Dose Route Frequency Provider Last Rate Last Dose  . amiodarone (PACERONE) tablet 200 mg  200 mg Oral Daily Janece Canterbury, MD   200 mg at 12/20/15 1025  . aspirin EC tablet 81 mg  81 mg Oral Daily Janece Canterbury, MD   81 mg at 12/20/15 1025  . bisacodyl (DULCOLAX) suppository 10 mg  10 mg Rectal Daily PRN Janece Canterbury, MD      . carvedilol (COREG) tablet 1.5625 mg  1.5625 mg Oral BID WC Janece Canterbury, MD   1.5625 mg at 12/20/15 0829  . clopidogrel (PLAVIX) tablet 75 mg  75 mg Oral Daily Janece Canterbury, MD   75 mg at 12/20/15 1025  . collagenase (SANTYL) ointment   Topical BID Janece Canterbury, MD      . digoxin (LANOXIN) tablet 0.125 mg  0.125 mg Oral Daily Janece Canterbury, MD   0.125 mg at 12/20/15 1025  . docusate sodium (COLACE) capsule 100 mg  100 mg Oral BID Janece Canterbury, MD   100 mg at 12/20/15 1025  .  HYDROcodone-acetaminophen (NORCO/VICODIN) 5-325 MG per tablet 1-2 tablet  1-2 tablet Oral Q6H PRN Janece Canterbury, MD   2 tablet at 12/20/15 1025  . insulin aspart (novoLOG) injection 0-9 Units  0-9 Units Subcutaneous TID WC Janece Canterbury, MD   5 Units at 12/20/15 1313  . insulin aspart (novoLOG) injection 4 Units  4 Units Subcutaneous TID WC Janece Canterbury, MD      . insulin detemir (LEVEMIR) injection 15 Units  15 Units Subcutaneous QHS Janece Canterbury, MD      . methocarbamol (ROBAXIN) tablet 500 mg  500 mg Oral Q6H PRN Janece Canterbury, MD   500 mg at 12/18/15 1812   Or  . methocarbamol (ROBAXIN) 500 mg in dextrose 5 % 50 mL IVPB  500 mg Intravenous Q6H PRN Janece Canterbury, MD      . mirtazapine (REMERON) tablet 15 mg  15 mg Oral QHS Janece Canterbury, MD   15 mg at 12/19/15 2155  . morphine 2 MG/ML injection 0.5 mg  0.5 mg Intravenous Q2H  PRN Janece Canterbury, MD      . polyethylene glycol (MIRALAX / GLYCOLAX) packet 17 g  17 g Oral Daily PRN Janece Canterbury, MD      . rosuvastatin (CRESTOR) tablet 40 mg  40 mg Oral Daily Janece Canterbury, MD   40 mg at 12/20/15 1025  . senna (SENOKOT) tablet 8.6 mg  1 tablet Oral BID Janece Canterbury, MD   8.6 mg at 12/20/15 1025  . tamsulosin (FLOMAX) capsule 0.4 mg  0.4 mg Oral Daily Janece Canterbury, MD   0.4 mg at 12/20/15 1025  . Tdap (BOOSTRIX) injection 0.5 mL  0.5 mL Intramuscular Once Janece Canterbury, MD   Stopped at 12/16/15 2313     Discharge Medications: Please see discharge summary for a list of discharge medications.  Relevant Imaging Results:  Relevant Lab Results:   Additional Information SS#: [id number]  Candie Chroman, LCSW

## 2015-12-20 NOTE — Progress Notes (Signed)
PROGRESS NOTE  [name] [name]  [id number] DOB: Apr 18, 1946 DOA: 12/16/2015 PCP: [name], MD  Brief Narrative:   [name] [name] is a 70 y.o. male with CAD, V-tach s/p PPM-ICD, ischemic cardiomyopathy with EF 15-20%, stroke, diabetes mellitus type 2, hypertension, gout, and CKD stage III who presents with a fall and hip pain.  He was hospitalized from 10/27/2015 through 11/06/2015.  He presented with confusion and vomiting.  In the ER, he had VT and his AICD fired.  He was admitted to the ICU and was found to have an NSTEMI (trop > 65), aspiration pneumonia, and Klebsiella UTI.  He underwent left heart catheterization which showed multivessel disease with a 90% stenosis of the prox LAD to mid LAD, totally occluded RCA, and a 90% stenosed distal circ.  He had a DES placed in the distal Cx on 7/11.  His echocardiogram demonstrated an EF of 15-20% with akinesis of the apical myocardium and significant LV dilation.  He was discharged to SNF where he stayed for 20 days and was discharged to home.   On the day of admission, he had a mechanical fall while trying to open a door. He was found to have a right femoral neck fracture.  Cardiology and orthopedics have been consulted.  He is moderate to high risk for surgery and cannot stop his aspirin or plavix.  Patient would like to pursue surgery.   Assessment & Plan:   Active Problems:   Essential hypertension   Chronic combined systolic and diastolic heart failure (HCC)   Type 2 diabetes mellitus with diabetic nephropathy (HCC)   Chronic renal insufficiency, stage III (moderate)   CAD, multiple vessel, RCA 100% mid occl.; LAD 99% stenosisprox.  mid of 50-60%; LCX OM-1 90%, OM-2 99%,AV groove 80%    Fracture, hip (HCC)   Pressure ulcer   Protein-calorie malnutrition, severe  Right femoral neck fracture s/p right hemiarthroplasty on 8/27 by Dr. Ronnie Derby -  Pain control -  Mobilize with PT/OT > recommending SNF -  SW consult placed  Multivessel  CAD s/p NSTEMI with peak Troponin >65 s/p DES to distal Cx on 11/01/2015 -  Continue aspirin, plavix, low dose beta blocker, and statin -  Cardiology consult appreciated -  Patient has been chest free -  NTG prn  Ischemic Cardiomyopathy w/ Systolic and Diastolic CHF, followed by Dr. Haroldine Laws.   - 7/8 echocardiogram EF 15-20% - continue BB, digoxin -  Appears to be eating and drinking better today, but appears dry on exam -  Continue to hold diuretics - not on ACEI/ARB due to CKD  Ventricular Tachycardia - IACD Fired in ED 7/6 -Amiodarone 200 mg BID  Probable Partial Seizure with left arm shaking.   -  F/u with Neurology on 8/31. -  Not on seizure medication   H/O CVA - Mild persistent right-sided weakness   CKD III (baseline Cr 1.7) -  Minimize nephrotoxins and renally dose medications   Mild hyperkalemia, asymptomatic, likely related to spironolactone use in setting of CKD -  Hold spironolactone  DM Type 2 uncontrolled 7/6 Hemoglobin A1c 12.0 - increase Lantus to 15 units (home is 20 units) - continue low dose SSI -  Add standing aspart with meals  Left foot diabetic ulcer -Left plantar surface ulcer lateral aspect. Santyl ointment BID  Multiple abrasions:  Apply vaseline and cover with dry bandage  Leukocytosis, likely related to fall -  No pneumonia on CXR -  UA not obtained, asymptomatic  Normocytic  anemia, likely due to CKD, hemoglobin at baseline of 10-11mg /dl.  Occult negative.  Stage 1 pressure ulcer on sacrum, present at time of admission  Severe protein calorie malnutrition -  Supplements  -  Liberalize diet  DVT prophylaxis: heparin and SCDs Code Status: full Family Communication: patient, his wife and daughter  Disposition Plan:  to SNF tomorrow with close follow up with cardiology   Consultants:   Orthopedic surgery, Dr. Ronnie Derby  [facility]  Procedures:  none  Antimicrobials:    perioperative   Subjective: Comfortable at rest, pain with movement in right leg.  Denies chest pain, SOB, lightheadedness   Objective: Vitals:   12/20/15 0500 12/20/15 0733 12/20/15 1030 12/20/15 1222  BP:  105/68  111/64  Pulse:  82 92 73  Resp:  (!) 21  (!) 23  Temp:  99.2 F (37.3 C)  98.5 F (36.9 C)  TempSrc:  Oral  Oral  SpO2:  98% 98% 98%  Weight: 70.2 kg (154 lb 12.2 oz)     Height:        Intake/Output Summary (Last 24 hours) at 12/20/15 1359 Last data filed at 12/20/15 1200  Gross per 24 hour  Intake              720 ml  Output              800 ml  Net              -80 ml   Filed Weights   12/18/15 1415 12/19/15 0317 12/20/15 0500  Weight: 68.7 kg (151 lb 7.3 oz) 68.9 kg (152 lb) 70.2 kg (154 lb 12.2 oz)    Examination:  General exam:  Adult male.  No acute distress.  Mildly confused this morning HEENT:  NCAT, dry MM, sunken eyes Respiratory system: Clear to auscultation bilaterally Cardiovascular system: Regular rate and rhythm, gallop present.  Warm extremities  Gastrointestinal system: Normal active bowel sounds, soft, nondistended, nontender. MSK:  Normal tone and bulk, trace lower extremity edema.  Right leg dressing c/d/i, minimal swelling and no bruising.  2+ pulse Neuro:  Grossly intact.  Able to wiggle toes on right foot.  SITLT right foot    Data Reviewed: I have personally reviewed following labs and imaging studies  CBC:  Recent Labs Lab 12/16/15 1335 12/17/15 0603 12/18/15 0333 12/19/15 0640 12/20/15 0347  WBC 13.4* 9.6 10.6* 12.6* 12.5*  NEUTROABS 11.4*  --   --   --   --   HGB 11.0* 9.7* 10.6* 10.2* 9.9*  HCT 36.2* 32.3* 34.8* 32.8* 31.9*  MCV 91.6 93.4 91.8 90.1 90.4  PLT 168 153 165 154 [id number]   Basic Metabolic Panel:  Recent Labs Lab 12/16/15 1335 12/17/15 0603 12/18/15 0333 12/19/15 0640 12/20/15 0347  NA 142 142 137 134* 134*  K 5.3* 4.8 4.6 5.1 4.7  CL 113* 109 106 100* 102  CO2 24 27 26 24 23   GLUCOSE 220*  101* 101* 190* 181*  BUN 31* 27* 30* 34* 41*  CREATININE 1.83* 1.74* 1.78* 1.68* 1.81*  CALCIUM 8.8* 8.4* 8.4* 8.4* 8.2*   GFR: Estimated Creatinine Clearance: 38.2 mL/min (by C-G formula based on SCr of 1.81 mg/dL). Liver Function Tests:  Recent Labs Lab 12/16/15 1335  AST 67*  ALT 61  ALKPHOS 146*  BILITOT 0.7  PROT 7.4  ALBUMIN 3.0*   No results for input(s): LIPASE, AMYLASE in the last 168 hours. No results for input(s): AMMONIA in the last 168 hours.  Coagulation Profile: No results for input(s): INR, PROTIME in the last 168 hours. Cardiac Enzymes: No results for input(s): CKTOTAL, CKMB, CKMBINDEX, TROPONINI in the last 168 hours. BNP (last 3 results) No results for input(s): PROBNP in the last 8760 hours. HbA1C: No results for input(s): HGBA1C in the last 72 hours. CBG:  Recent Labs Lab 12/19/15 1623 12/19/15 1819 12/19/15 2115 12/20/15 0737 12/20/15 1218  GLUCAP 235* 308* 243* 157* 251*   Lipid Profile: No results for input(s): CHOL, HDL, LDLCALC, TRIG, CHOLHDL, LDLDIRECT in the last 72 hours. Thyroid Function Tests: No results for input(s): TSH, T4TOTAL, FREET4, T3FREE, THYROIDAB in the last 72 hours. Anemia Panel: No results for input(s): VITAMINB12, FOLATE, FERRITIN, TIBC, IRON, RETICCTPCT in the last 72 hours. Urine analysis:    Component Value Date/Time   COLORURINE YELLOW 10/27/2015 0900   APPEARANCEUR CLOUDY (A) 10/27/2015 0900   LABSPEC 1.028 10/27/2015 0900   PHURINE 5.0 10/27/2015 0900   GLUCOSEU >1000 (A) 10/27/2015 0900   HGBUR SMALL (A) 10/27/2015 0900   BILIRUBINUR negative 11/28/2015 1442   KETONESUR NEGATIVE 10/27/2015 0900   PROTEINUR positive 11/28/2015 1442   PROTEINUR NEGATIVE 10/27/2015 0900   UROBILINOGEN negative 11/28/2015 1442   UROBILINOGEN 0.2 09/06/2011 0908   NITRITE negative 11/28/2015 1442   NITRITE POSITIVE (A) 10/27/2015 0900   LEUKOCYTESUR moderate (2+) (A) 11/28/2015 1442   Sepsis  Labs: @LABRCNTIP (procalcitonin:4,lacticidven:4)  ) Recent Results (from the past 240 hour(s))  MRSA PCR Screening     Status: None   Collection Time: 12/18/15  4:51 AM  Result Value Ref Range Status   MRSA by PCR NEGATIVE NEGATIVE Final    Comment:        The GeneXpert MRSA Assay (FDA approved for NASAL specimens only), is one component of a comprehensive MRSA colonization surveillance program. It is not intended to diagnose MRSA infection nor to guide or monitor treatment for MRSA infections.       Radiology Studies: No results found.   Scheduled Meds: . amiodarone  200 mg Oral Daily  . aspirin EC  81 mg Oral Daily  . carvedilol  1.5625 mg Oral BID WC  . clopidogrel  75 mg Oral Daily  . collagenase   Topical BID  . digoxin  0.125 mg Oral Daily  . docusate sodium  100 mg Oral BID  . insulin aspart  0-9 Units Subcutaneous TID WC  . insulin detemir  10 Units Subcutaneous QHS  . mirtazapine  15 mg Oral QHS  . rosuvastatin  40 mg Oral Daily  . senna  1 tablet Oral BID  . tamsulosin  0.4 mg Oral Daily  . Tdap  0.5 mL Intramuscular Once   Continuous Infusions:    LOS: 4 days    Time spent: 30 min    Janece Canterbury, MD [facility] Pager [phone]  If 7PM-7AM, please contact night-coverage [email or web address] Password [email or web address] 12/20/2015, 1:59 PM

## 2015-12-20 NOTE — Progress Notes (Signed)
Pt c/o swelling to Rt. Knee 5/10 scale. Declined pain med. Dr. [name]. Short notified and instructed to keep it elevated and apply ice pack to knee.  Will continue to monitor.   Kal Chait, Therapist, sports.

## 2015-12-20 NOTE — Progress Notes (Signed)
Inpatient Diabetes Program Recommendations  AACE/ADA: New Consensus Statement on Inpatient Glycemic Control (2015)  Target Ranges:  Prepandial:   less than 140 mg/dL      Peak postprandial:   less than 180 mg/dL (1-2 hours)      Critically ill patients:  140 - 180 mg/dL   Lab Results  Component Value Date   GLUCAP 251 (H) 12/20/2015   HGBA1C 12.0 (H) 10/27/2015    Review of Glycemic Control:  Results for CHEVY, UBALDO (MRN [id number]) as of 12/20/2015 13:59  Ref. Range 12/19/2015 16:23 12/19/2015 18:19 12/19/2015 21:15 12/20/2015 07:37 12/20/2015 12:18  Glucose-Capillary Latest Ref Range: 65 - 99 mg/dL 235 (H) 308 (H) 243 (H) 157 (H) 251 (H)   Diabetes history: Type 2 diabetes Outpatient Diabetes medications: Levemir 20 units q HS, Novolog 5-15 units tid with  meals Current orders for Inpatient glycemic control:  Levemir 10 units q HS, Novolog sensitive tid with meals  Inpatient Diabetes Program Recommendations:    Please consider increasing Levemir to home dose of 20 units daily.  Also please consider adding Novolog meal coverage 5 units tid with meals-Hold if patient eats less than 50%.   Thanks,  Adah Perl, RN, BC-ADM Inpatient Diabetes Coordinator Pager [phone] (8a-5p)

## 2015-12-20 NOTE — Progress Notes (Signed)
Patient is for SNF placement; CM will continue to follow for DCP; B Pennie Rushing [phone]

## 2015-12-20 NOTE — Progress Notes (Signed)
Patient Name: [name] [name] Date of Encounter: 12/20/2015  Hospital Problem List     Active Problems:   Essential hypertension   Chronic combined systolic and diastolic heart failure (HCC)   Type 2 diabetes mellitus with diabetic nephropathy (HCC)   Chronic renal insufficiency, stage III (moderate)   CAD, multiple vessel, RCA 100% mid occl.; LAD 99% stenosisprox.  mid of 50-60%; LCX OM-1 90%, OM-2 99%,AV groove 80%    Fracture, hip (HCC)   Pressure ulcer   Protein-calorie malnutrition, severe    Patient Profile     Patient is a 70 y.o. male with a PMHx of Coronary artery disease-congestive heart failure, diabetes mellitus, chronic kidney disease stage III, who was admitted to [facility] on 12/16/2015 for evaluation of fracture of the right femoral neck and underwent arthroplasty of the right hip.   The patient has a history of an anterior wall myocardial infarction 2005.  He has chronic systolic congestive heart failure with an ejection fraction 15%. He also has a history of essential hypertension and a CVA in 2015.  Subjective   No SOB, reports getting up OOB this morning and doing ok.   Inpatient Medications    . amiodarone  200 mg Oral Daily  . aspirin EC  81 mg Oral Daily  . carvedilol  1.5625 mg Oral BID WC  . clopidogrel  75 mg Oral Daily  . collagenase   Topical BID  . digoxin  0.125 mg Oral Daily  . docusate sodium  100 mg Oral BID  . insulin aspart  0-9 Units Subcutaneous TID WC  . insulin detemir  10 Units Subcutaneous QHS  . mirtazapine  15 mg Oral QHS  . rosuvastatin  40 mg Oral Daily  . senna  1 tablet Oral BID  . tamsulosin  0.4 mg Oral Daily  . Tdap  0.5 mL Intramuscular Once    Vital Signs    Vitals:   12/20/15 0500 12/20/15 0733 12/20/15 1030 12/20/15 1222  BP:  105/68  111/64  Pulse:  82 92 73  Resp:  (!) 21  (!) 23  Temp:  99.2 F (37.3 C)  98.5 F (36.9 C)  TempSrc:  Oral  Oral  SpO2:  98% 98% 98%  Weight: 154 lb 12.2 oz (70.2 kg)       Height:        Intake/Output Summary (Last 24 hours) at 12/20/15 1342 Last data filed at 12/20/15 1200  Gross per 24 hour  Intake              720 ml  Output              800 ml  Net              -80 ml   Filed Weights   12/18/15 1415 12/19/15 0317 12/20/15 0500  Weight: 151 lb 7.3 oz (68.7 kg) 152 lb (68.9 kg) 154 lb 12.2 oz (70.2 kg)    Physical Exam    GEN: Well nourished, well developed, in no acute distress, chronically ill.  Neck: Supple, no JVD, carotid bruits, or masses. Cardiac: RRR, 3/6 systolic murmur, no rubs, or gallops. No clubbing, cyanosis, no edema.  Radials/DP/PT 2+ and equal bilaterally.  Respiratory:  Respirations  regular and unlabored, clear to auscultation bilaterally. GI: Soft, nontender, nondistended, BS + x 4. Neuro:  Strength and sensation are intact.   Labs    CBC  Recent Labs  12/19/15 0640 12/20/15 0347  WBC 12.6*  12.5*  HGB 10.2* 9.9*  HCT 32.8* 31.9*  MCV 90.1 90.4  PLT 154 [id number]   Basic Metabolic Panel  Recent Labs  12/19/15 0640 12/20/15 0347  NA 134* 134*  K 5.1 4.7  CL 100* 102  CO2 24 23  GLUCOSE 190* 181*  BUN 34* 41*  CREATININE 1.68* 1.81*  CALCIUM 8.4* 8.2*   Liver Function Tests No results for input(s): AST, ALT, ALKPHOS, BILITOT, PROT, ALBUMIN in the last 72 hours. No results for input(s): LIPASE, AMYLASE in the last 72 hours. Cardiac Enzymes No results for input(s): CKTOTAL, CKMB, CKMBINDEX, TROPONINI in the last 72 hours. BNP Invalid input(s): POCBNP D-Dimer No results for input(s): DDIMER in the last 72 hours. Hemoglobin A1C No results for input(s): HGBA1C in the last 72 hours. Fasting Lipid Panel No results for input(s): CHOL, HDL, LDLCALC, TRIG, CHOLHDL, LDLDIRECT in the last 72 hours. Thyroid Function Tests No results for input(s): TSH, T4TOTAL, T3FREE, THYROIDAB in the last 72 hours.  Invalid input(s): FREET3  Telemetry    NSR  ECG    NA  Radiology      Assessment & Plan    1. CAD:   No evidence of active ischemia post op.  Continue current therapy.  2. ISCHEMIC CARDIOMYOPATHY:  Seems to be euvolemic. No reported dyspnea, worked with PT this morning. Continue current therapy.   3. HTN: Controlled   Signed, Reino Bellis, NP  12/20/2015, 1:42 PM

## 2015-12-20 NOTE — Telephone Encounter (Signed)
Physical Therapist saw patient saw patient on 12/15/15.  Patient fell on 12/16/15 and broke his hip.  Patient went to [facility] and had a hip replacement.

## 2015-12-20 NOTE — Telephone Encounter (Signed)
Pt wife called , pt is in the hospital fell broke hip, pt would like DB to come see him.. Please advise

## 2015-12-20 NOTE — Progress Notes (Signed)
SPORTS MEDICINE AND JOINT REPLACEMENT  Lara Mulch, MD    Carlyon Shadow, PA-C [place] [address], [place], [place]  [address]                             [phone]   PROGRESS NOTE  Subjective:  negative for Chest Pain  negative for Shortness of Breath  negative for Nausea/Vomiting   negative for Calf Pain  negative for Bowel Movement   Tolerating Diet: yes         Patient reports pain as 5 on 0-10 scale.    Objective: Vital signs in last 24 hours:   Patient Vitals for the past 24 hrs:  BP Temp Temp src Pulse Resp SpO2 Height Weight  12/20/15 1939 (!) 109/57 97.5 F (36.4 C) Oral 71 18 100 % - -  12/20/15 1459 (!) 115/58 97.8 F (36.6 C) Oral 68 13 100 % 6\' 2"  (1.88 m) 69.4 kg (152 lb 14.4 oz)  12/20/15 1419 (!) 96/58 97.6 F (36.4 C) Oral 69 (!) 21 98 % - -  12/20/15 1222 111/64 98.5 F (36.9 C) Oral 73 (!) 23 98 % - -  12/20/15 1030 - - - 92 - 98 % - -  12/20/15 0733 105/68 99.2 F (37.3 C) Oral 82 (!) 21 98 % - -  12/20/15 0500 - - - - - - - 70.2 kg (154 lb 12.2 oz)  12/20/15 0357 118/63 98.6 F (37 C) Oral 76 18 99 % - -  12/19/15 2329 127/73 98.5 F (36.9 C) Oral 79 (!) 25 97 % - -    @flow {1959:LAST@   Intake/Output from previous day:   08/28 0701 - 08/29 0700 In: -  Out: 600 [Urine:600]   Intake/Output this shift:   No intake/output data recorded.   Intake/Output      08/29 0701 - 08/30 0700   P.O. 960   Total Intake(mL/kg) 960 (13.8)   Urine (mL/kg/hr) 450 (0.5)   Stool 0 (0)   Total Output 450   Net +510       Urine Occurrence 0 x   Stool Occurrence 0 x      LABORATORY DATA:  Recent Labs  12/16/15 1335 12/17/15 0603 12/18/15 0333 12/19/15 0640 12/20/15 0347  WBC 13.4* 9.6 10.6* 12.6* 12.5*  HGB 11.0* 9.7* 10.6* 10.2* 9.9*  HCT 36.2* 32.3* 34.8* 32.8* 31.9*  PLT 168 153 165 154 155    Recent Labs  12/16/15 1335 12/17/15 0603 12/18/15 0333 12/19/15 0640 12/20/15 0347  NA 142 142 137 134* 134*  K 5.3* 4.8 4.6 5.1 4.7   CL 113* 109 106 100* 102  CO2 24 27 26 24 23   BUN 31* 27* 30* 34* 41*  CREATININE 1.83* 1.74* 1.78* 1.68* 1.81*  GLUCOSE 220* 101* 101* 190* 181*  CALCIUM 8.8* 8.4* 8.4* 8.4* 8.2*   Lab Results  Component Value Date   INR 1.20 10/27/2015   INR 1.10 10/27/2015   INR 0.99 12/09/2013    Examination:  General appearance: alert, cooperative and no distress Extremities: extremities normal, atraumatic, no cyanosis or edema  Wound Exam: clean, dry, intact   Drainage:  None: wound tissue dry  Motor Exam: Quadriceps and Hamstrings Intact  Sensory Exam: Superficial Peroneal, Deep Peroneal and Tibial normal   Assessment:    2 Days Post-Op  Procedure(s) (LRB): ARTHROPLASTY BIPOLAR HIP (HEMIARTHROPLASTY) (Right)  ADDITIONAL DIAGNOSIS:  Active Problems:   Essential hypertension  Chronic combined systolic and diastolic heart failure (HCC)   Type 2 diabetes mellitus with diabetic nephropathy (HCC)   Chronic renal insufficiency, stage III (moderate)   CAD, multiple vessel, RCA 100% mid occl.; LAD 99% stenosisprox.  mid of 50-60%; LCX OM-1 90%, OM-2 99%,AV groove 80%    Fracture, hip (HCC)   Pressure ulcer   Protein-calorie malnutrition, severe  Acute Blood Loss Anemia   Plan: Physical Therapy as ordered Weight Bearing as Tolerated (WBAT)  DVT Prophylaxis:  Lovenox  DISCHARGE PLAN: Skilled Nursing Facility/Rehab  DISCHARGE NEEDS: HHPT   Patient progressing daily. Will need follow up in office in 2 weeks for xrays and staple removal. He is stable enough from a hip standpoint to allow for D/C to SNF. Will continue to monitor until then          Donia Ast 12/20/2015, 8:17 PM

## 2015-12-20 NOTE — Clinical Social Work Note (Signed)
CSW acknowledges SNF consults. Patient needs PT eval.  [name] [name], [name] [name]

## 2015-12-20 NOTE — Telephone Encounter (Signed)
See below

## 2015-12-21 DIAGNOSIS — S72001D Fracture of unspecified part of neck of right femur, subsequent encounter for closed fracture with routine healing: Secondary | ICD-10-CM

## 2015-12-21 DIAGNOSIS — Z794 Long term (current) use of insulin: Secondary | ICD-10-CM

## 2015-12-21 DIAGNOSIS — N189 Chronic kidney disease, unspecified: Secondary | ICD-10-CM

## 2015-12-21 DIAGNOSIS — E43 Unspecified severe protein-calorie malnutrition: Secondary | ICD-10-CM

## 2015-12-21 DIAGNOSIS — I10 Essential (primary) hypertension: Secondary | ICD-10-CM

## 2015-12-21 DIAGNOSIS — E1121 Type 2 diabetes mellitus with diabetic nephropathy: Secondary | ICD-10-CM

## 2015-12-21 LAB — BASIC METABOLIC PANEL
ANION GAP: 7 (ref 5–15)
BUN: 42 mg/dL — AB (ref 6–20)
CALCIUM: 8.2 mg/dL — AB (ref 8.9–10.3)
CO2: 26 mmol/L (ref 22–32)
Chloride: 105 mmol/L (ref 101–111)
Creatinine, Ser: 1.75 mg/dL — ABNORMAL HIGH (ref 0.61–1.24)
GFR calc Af Amer: 44 mL/min — ABNORMAL LOW (ref >60)
GFR calc non Af Amer: 38 mL/min — ABNORMAL LOW (ref >60)
GLUCOSE: 177 mg/dL — AB (ref 65–99)
POTASSIUM: 4.7 mmol/L (ref 3.5–5.1)
Sodium: 138 mmol/L (ref 135–145)

## 2015-12-21 LAB — CBC
HEMATOCRIT: 30.6 % — AB (ref 39.0–52.0)
Hemoglobin: 9.4 g/dL — ABNORMAL LOW (ref 13.0–17.0)
MCH: 27.6 pg (ref 26.0–34.0)
MCHC: 30.7 g/dL (ref 30.0–36.0)
MCV: 90 fL (ref 78.0–100.0)
Platelets: 186 10*3/uL (ref 150–400)
RBC: 3.4 MIL/uL — ABNORMAL LOW (ref 4.22–5.81)
RDW: 15.5 % (ref 11.5–15.5)
WBC: 11.4 10*3/uL — AB (ref 4.0–10.5)

## 2015-12-21 LAB — GLUCOSE, CAPILLARY
GLUCOSE-CAPILLARY: 215 mg/dL — AB (ref 65–99)
Glucose-Capillary: 133 mg/dL — ABNORMAL HIGH (ref 65–99)
Glucose-Capillary: 189 mg/dL — ABNORMAL HIGH (ref 65–99)
Glucose-Capillary: 315 mg/dL — ABNORMAL HIGH (ref 65–99)

## 2015-12-21 NOTE — Clinical Social Work Note (Signed)
CSW notified patient that bed offer given from [facility]. He accepted. Patient's wife not at bedside.  Dayton Scrape, [place]

## 2015-12-21 NOTE — Evaluation (Signed)
Occupational Therapy Evaluation Patient Details Name: [name] [name] MRN: [id number] DOB: 1945-11-10 Today's Date: 12/21/2015    History of Present Illness 70 yo admitted after fall at home with Rt hip fx s/p hemiarthroplasty. Pt with recent D/C home from SNF after admission in july for NSTEMI. PMHx: pacemaker, CVA, DM, HTN, heart failure, CAD   Clinical Impression   Pt with decline in function and safety with ADLs and ADL mobility with decreased strength, balance and endurance. Eavl limited due to pt's report of R LE pain during bed mobility attempting to sit EOB. Pt would benefit from acute OT services to address impairments to increase level of function and safety    Follow Up Recommendations  SNF;Supervision/Assistance - 24 hour    Equipment Recommendations  Other (comment) (TBD at next venue of care)    Recommendations for Other Services       Precautions / Restrictions Precautions Precautions: Posterior Hip;Fall Precaution Comments: pt unable to recall all hip precautions. reviewed all posterior precautions with pt Restrictions Weight Bearing Restrictions: Yes RLE Weight Bearing: Weight bearing as tolerated      Mobility Bed Mobility Overal bed mobility: Needs Assistance Bed Mobility: Supine to Sit     Supine to sit: Mod assist     General bed mobility comments: Pt unable to complete bed mobility due to R LE pain when moving LEs towards EOB. Attmepted x 3, pt then declined. Per PT note, pt is mod A  Transfers                 General transfer comment: unable. Per PT note pr is max A +2    Balance     Sitting balance-Leahy Scale: Fair       Standing balance-Leahy Scale: Poor                              ADL Overall ADL's : Needs assistance/impaired     Grooming: Wash/dry hands;Wash/dry face;Bed level;Set up   Upper Body Bathing: Bed level;Set up;Supervision/ safety   Lower Body Bathing: Maximal assistance   Upper Body  Dressing : Supervision/safety;Set up;Bed level   Lower Body Dressing: Total assistance     Toilet Transfer Details (indicate cue type and reason): unable to attempt due to R LE with attempted bed mobility                 Vision  no change from baseline              Pertinent Vitals/Pain Pain Assessment: Faces Faces Pain Scale: Hurts even more Pain Location: R hip with attempted bed mobility Pain Descriptors / Indicators: Sore Pain Intervention(s): Limited activity within patient's tolerance;Monitored during session;Repositioned     Hand Dominance Right   Extremity/Trunk Assessment Upper Extremity Assessment Upper Extremity Assessment: Generalized weakness   Lower Extremity Assessment Lower Extremity Assessment: Defer to PT evaluation   Cervical / Trunk Assessment Cervical / Trunk Assessment: Kyphotic   Communication Communication Communication: No difficulties   Cognition Arousal/Alertness: Awake/alert Behavior During Therapy: WFL for tasks assessed/performed Overall Cognitive Status: No family/caregiver present to determine baseline cognitive functioning       Memory: Decreased recall of precautions;Decreased short-term memory             General Comments   Pt pleasant, limited by pain                 Home Living Family/patient expects to be  discharged to:: Skilled nursing facility Living Arrangements: Spouse/significant other Available Help at Discharge: Family;Available 24 hours/day Type of Home: House Home Access: Stairs to enter   Entrance Stairs-Rails: Right Home Layout: One level     Bathroom Shower/Tub: Teacher, early years/pre: Standard     Home Equipment: Environmental consultant - 2 wheels;Cane - single point;Tub bench   Additional Comments: aide for bathing at home      Prior Functioning/Environment Level of Independence: Independent with assistive device(s)        Comments: only uses cane in crowds    OT Diagnosis:  Generalized weakness;Acute pain   OT Problem List: Decreased strength;Impaired balance (sitting and/or standing);Decreased knowledge of precautions;Pain;Decreased activity tolerance;Decreased knowledge of use of DME or AE   OT Treatment/Interventions: Self-care/ADL training;DME and/or AE instruction;Therapeutic activities;Patient/family education;Therapeutic exercise    OT Goals(Current goals can be found in the care plan section) Acute Rehab OT Goals Patient Stated Goal: get better and go home OT Goal Formulation: With patient Time For Goal Achievement: 12/28/15 Potential to Achieve Goals: Good ADL Goals Pt Will Perform Grooming: with min guard assist;sitting Pt Will Perform Upper Body Bathing: with min guard assist;with supervision;with set-up;sitting Pt Will Perform Lower Body Bathing: with mod assist;with caregiver independent in assisting Pt Will Perform Upper Body Dressing: with min guard assist;with supervision;with set-up;sitting Pt Will Transfer to Toilet: with max assist;with mod assist;bedside commode Pt Will Perform Toileting - Clothing Manipulation and hygiene: with max assist;with mod assist  OT Frequency: Min 2X/week   Barriers to D/C: Decreased caregiver support  pt planning to d/c to SNF for rehab                     End of Session    Activity Tolerance: Patient limited by pain Patient left: in bed;with call bell/phone within reach   Time:  -    Charges:  OT General Charges $OT Visit: 1 Procedure OT Evaluation $OT Eval Moderate Complexity: 1 Procedure G-Codes:    Britt Bottom 12/21/2015, 2:56 PM

## 2015-12-21 NOTE — Progress Notes (Signed)
PROGRESS NOTE  [name] [name] [id number] DOB: Apr 23, 1946 DOA: 12/16/2015 PCP: [name] [place], MD  Brief History:  [name] Lyndon Sheltonis a 70 y.o.malewith CAD, V-tach s/p PPM-ICD, ischemic cardiomyopathy with EF 15-20%, stroke, diabetes mellitus type 2, hypertension, gout, and CKD stage III who presents with a fall and hip pain. He was hospitalized from 10/27/2015 through 11/06/2015. He presented with confusion and vomiting. In the ER, he had VT and his AICD fired. He was admitted to the ICU and was found to have an NSTEMI (trop >65), aspiration pneumonia, and Klebsiella UTI. He underwent left heart catheterization which showed multivessel disease with a 90% stenosis of the prox LAD to mid LAD, totally occluded RCA, and a 90% stenosed distal circ. He had a DES placed in the distal Cx on 7/11. His echocardiogram demonstrated an EF of 15-20% with akinesis of the apical myocardium and significant LV dilation. He was discharged to SNF where he stayed for 20 days and was discharged to home.  On the day of admission, he had a mechanical fall while trying to open a door. He was found to have a right femoral neck fracture.  Cardiology and orthopedics have been consulted.  He is moderate to high risk for surgery and cannot stop his aspirin or plavix.  Patient would like to pursue surgery.  Assessment/Plan: Right femoral neck fracture s/p right hemiarthroplasty on 8/27 by Dr. Sherlean Foot - Pain control -  Mobilize with PT/OT > recommending SNF -  SW consult placed  Multivessel CAD s/p NSTEMI with peak Troponin >65 s/p DES to distal Cx on 11/01/2015 - Continue aspirin, plavix, low dose beta blocker, and statin - Cardiology consult appreciated - Patient has been chest free - NTG prn  Ischemic Cardiomyopathy w/ Systolic and Diastolic CHF, followed by Dr. Gala Romney.   - 7/8 echocardiogram EF 15-20% - continue BB, digoxin -  Appears to be eating and drinking better today, but appears  euvolemic on exam -  Continue to hold diuretics - not on ACEI/ARB due to CKD  Ventricular Tachycardia - IACD Fired in ED 7/6 -Amiodarone 200 mg BID  Probable Partial Seizure with left arm shaking.  - F/u with Neurology on 8/31. -  Not on seizure medication   H/O CVA - Mild persistentright-sided weakness   CKD III  - Minimize nephrotoxins and renally dose medications  -baseline creatinine 1.5-1.8  Mild hyperkalemia, asymptomatic, likely related to spironolactone use in setting of CKD - Hold spironolactone  DM Type 2 uncontrolled 7/6 Hemoglobin A1c 12.0 - increase Lantus to 15 units (home is 20 units) - continue low dose SSI -  Add standing aspart with meals--increase to 7 units with meals  Left foot diabetic ulcer -Left plantar surface ulcer lateral aspect. Santyl ointment BID  Multiple abrasions: Apply vaseline and cover with dry bandage  Leukocytosis, likely related to fall - No pneumonia on CXR -  UA not obtained, asymptomatic  Normocyticanemia, likely due to CKD,  -hemoglobin at baseline of 10-11mg /dl. Occult negative.  Stage 1 pressure ulcer on sacrum, present at time of admission  Severe protein calorie malnutrition -  Supplements  -  Liberalize diet   Disposition Plan:   SNF 8/31 if stable Family Communication:   Wife updated at bedside  Consultants:                   Orthopedic surgery, Dr. Sherlean Foot  [facility]   Code Status:  FULL / DNR  DVT Prophylaxis:  [place] Heparin / [place] Lovenox   Procedures: As Listed in Progress Note Above  Antibiotics: None    Subjective: Patient complains of pain in the right leg. Otherwise he denies any fevers, chills, chest pain, short breath, nausea, vomiting, diarrhea. Denies any abdominal pain, dysuria, hematuria. Denies any headache, neck pain.  Objective: Vitals:   12/20/15 1459 12/20/15 1939 12/21/15 0458 12/21/15 1231  BP: (!) 115/58 (!) 109/57 (!) 99/51 105/62  Pulse: 68 71 75  75  Resp: 13 18 18 20   Temp: 97.8 F (36.6 C) 97.5 F (36.4 C) 97.6 F (36.4 C) 97 F (36.1 C)  TempSrc: Oral Oral Oral Oral  SpO2: 100% 100% 100% 100%  Weight: 69.4 kg (152 lb 14.4 oz)  70.7 kg (155 lb 12.8 oz)   Height: 6\' 2"  (1.88 m)       Intake/Output Summary (Last 24 hours) at 12/21/15 1746 Last data filed at 12/21/15 1500  Gross per 24 hour  Intake              780 ml  Output             2025 ml  Net            -1245 ml   Weight change: -0.845 kg (-1 lb 13.8 oz) Exam:   General:  Pt is alert, follows commands appropriately, not in acute distress  HEENT: No icterus, No thrush, No neck mass, [place]/AT  Cardiovascular: RRR, S1/S2, no rubs, no gallops  Respiratory: CTA bilaterally, no wheezing, no crackles, no rhonchi  Abdomen: Soft/+BS, non tender, non distended, no guarding  Extremities: No edema, No lymphangitis, No petechiae, No rashes, no synovitis   Data Reviewed: I have personally reviewed following labs and imaging studies Basic Metabolic Panel:  Recent Labs Lab 12/17/15 0603 12/18/15 0333 12/19/15 0640 12/20/15 0347 12/21/15 0259  NA 142 137 134* 134* 138  K 4.8 4.6 5.1 4.7 4.7  CL 109 106 100* 102 105  CO2 27 26 24 23 26   GLUCOSE 101* 101* 190* 181* 177*  BUN 27* 30* 34* 41* 42*  CREATININE 1.74* 1.78* 1.68* 1.81* 1.75*  CALCIUM 8.4* 8.4* 8.4* 8.2* 8.2*   Liver Function Tests:  Recent Labs Lab 12/16/15 1335  AST 67*  ALT 61  ALKPHOS 146*  BILITOT 0.7  PROT 7.4  ALBUMIN 3.0*   No results for input(s): LIPASE, AMYLASE in the last 168 hours. No results for input(s): AMMONIA in the last 168 hours. Coagulation Profile: No results for input(s): INR, PROTIME in the last 168 hours. CBC:  Recent Labs Lab 12/16/15 1335 12/17/15 0603 12/18/15 0333 12/19/15 0640 12/20/15 0347 12/21/15 0259  WBC 13.4* 9.6 10.6* 12.6* 12.5* 11.4*  NEUTROABS 11.4*  --   --   --   --   --   HGB 11.0* 9.7* 10.6* 10.2* 9.9* 9.4*  HCT 36.2* 32.3* 34.8* 32.8*  31.9* 30.6*  MCV 91.6 93.4 91.8 90.1 90.4 90.0  PLT 168 153 165 154 155 186   Cardiac Enzymes: No results for input(s): CKTOTAL, CKMB, CKMBINDEX, TROPONINI in the last 168 hours. BNP: Invalid input(s): POCBNP CBG:  Recent Labs Lab 12/20/15 1627 12/20/15 2100 12/21/15 0552 12/21/15 1130 12/21/15 1632  GLUCAP 318* 215* 133* 189* 315*   HbA1C: No results for input(s): HGBA1C in the last 72 hours. Urine analysis:    Component Value Date/Time   COLORURINE YELLOW 10/27/2015 0900   APPEARANCEUR CLOUDY (A) 10/27/2015 0900   LABSPEC 1.028 10/27/2015 0900  PHURINE 5.0 10/27/2015 0900   GLUCOSEU >1000 (A) 10/27/2015 0900   HGBUR SMALL (A) 10/27/2015 0900   BILIRUBINUR negative 11/28/2015 1442   KETONESUR NEGATIVE 10/27/2015 0900   PROTEINUR positive 11/28/2015 1442   PROTEINUR NEGATIVE 10/27/2015 0900   UROBILINOGEN negative 11/28/2015 1442   UROBILINOGEN 0.2 09/06/2011 0908   NITRITE negative 11/28/2015 1442   NITRITE POSITIVE (A) 10/27/2015 0900   LEUKOCYTESUR moderate (2+) (A) 11/28/2015 1442   Sepsis Labs: @LABRCNTIP (procalcitonin:4,lacticidven:4) ) Recent Results (from the past 240 hour(s))  MRSA PCR Screening     Status: None   Collection Time: 12/18/15  4:51 AM  Result Value Ref Range Status   MRSA by PCR NEGATIVE NEGATIVE Final    Comment:        The GeneXpert MRSA Assay (FDA approved for NASAL specimens only), is one component of a comprehensive MRSA colonization surveillance program. It is not intended to diagnose MRSA infection nor to guide or monitor treatment for MRSA infections.      Scheduled Meds: . amiodarone  200 mg Oral Daily  . aspirin EC  81 mg Oral Daily  . carvedilol  1.5625 mg Oral BID WC  . clopidogrel  75 mg Oral Daily  . collagenase   Topical BID  . digoxin  0.125 mg Oral Daily  . docusate sodium  100 mg Oral BID  . insulin aspart  0-9 Units Subcutaneous TID WC  . insulin aspart  4 Units Subcutaneous TID WC  . insulin detemir  15  Units Subcutaneous QHS  . mirtazapine  15 mg Oral QHS  . rosuvastatin  40 mg Oral Daily  . senna  1 tablet Oral BID  . tamsulosin  0.4 mg Oral Daily  . Tdap  0.5 mL Intramuscular Once   Continuous Infusions:   Procedures/Studies: Dg Chest 2 View  Result Date: 12/16/2015 CLINICAL DATA:  Fall yesterday, hip fracture. EXAM: CHEST  2 VIEW COMPARISON:  Chest x-rays dated 11/28/2015 and 11/04/2015. FINDINGS: Mild cardiomegaly is stable. Left chest wall pacemaker/ICD in place. Lungs are clear. No evidence of pneumonia. No pleural effusion or pneumothorax seen. Osseous structures about the chest are unremarkable. IMPRESSION: No active cardiopulmonary disease.  Stable mild cardiomegaly. Electronically Signed   By: Bary Richard M.D.   On: 12/16/2015 14:22   Dg Chest 2 View  Result Date: 11/28/2015 CLINICAL DATA:  Productive cough. Low-grade fever for the past 4 days. EXAM: CHEST  2 VIEW COMPARISON:  11/04/2015. FINDINGS: Normal sized heart. Clear lungs. The interstitial markings are mildly prominent. Stable left subclavian AICD lead. Unremarkable bones. IMPRESSION: No acute abnormality.  Mild chronic interstitial lung disease. Electronically Signed   By: Beckie Salts M.D.   On: 11/28/2015 17:17   Ct Head Wo Contrast  Result Date: 12/16/2015 CLINICAL DATA:  fall fall at or. EXAM: CT HEAD WITHOUT CONTRAST TECHNIQUE: Contiguous axial images were obtained from the base of the skull through the vertex without intravenous contrast. COMPARISON:  CT 11/21/2015 FINDINGS: Brain: No acute intracranial hemorrhage. No focal mass lesion. No CT evidence of acute infarction. No midline shift or mass effect. No hydrocephalus. Basilar cisterns are patent. There are periventricular and subcortical white matter hypodensities. Generalized cortical atrophy. Vascular: Unremarkable Skull: No calvarial lesion Sinuses/Orbits: Paranasal sinuses and mastoid air cells are clear. Orbits are clear. Other: None IMPRESSION: 1. No acute  intracranial findings. 2. Atrophy and chronic white matter microvascular disease. Electronically Signed   By: Genevive Bi M.D.   On: 12/16/2015 13:56   Dg Hip  Unilat  With Pelvis 2-3 Views Right  Result Date: 12/16/2015 CLINICAL DATA:  Larey Seat last night.  Right hip pain. EXAM: DG HIP (WITH OR WITHOUT PELVIS) 2-3V RIGHT COMPARISON:  None. FINDINGS: There is a fracture of the right femoral neck. Small comminuted fracture components are noted. Primary fracture components are displaced, with the shaft component migrating superiorly by approximately 2 cm. There is mild varus angulation and mild apex anterior angulation. No other fractures. Hip joints are normally aligned as are the SI joints and symphysis pubis. Bones are demineralized. IMPRESSION: Displaced, mildly angulated, right femoral neck fracture. Electronically Signed   By: Amie Portland M.D.   On: 12/16/2015 12:55    Selisa Tensley, DO  [facility] Pager [phone]  If 7PM-7AM, please contact night-coverage [email or web address] Password [email or web address] 12/21/2015, 5:46 PM   LOS: 5 days

## 2015-12-21 NOTE — Telephone Encounter (Signed)
Will notify Dr Haroldine Laws

## 2015-12-21 NOTE — Discharge Summary (Addendum)
Physician Discharge Summary  [name] [name] [id number] DOB: Apr 11, 1946 DOA: 12/16/2015  PCP: [name] Swaziland, MD  Admit date: 12/16/2015 Discharge date: 12/22/2015  Admitted From: Home Disposition:  SNF  Recommendations for Outpatient Follow-up:  1. Follow up with PCP in 1-2 weeks 2. Please obtain BMP on 12/27/15   Discharge Condition:*Stable CODE STATUS:FULL Diet recommendation: Heart Healthy  Brief/Interim Summary: Anterio Demlow Sheltonis a 70 y.o.malewith CAD, V-tach s/p PPM-ICD, ischemic cardiomyopathy with EF 15-20%, stroke, diabetes mellitus type 2, hypertension, gout, and CKD stage III who presents with a fall and hip pain. He was hospitalized from 10/27/2015 through 11/06/2015. He presented with confusion and vomiting. In the ER, he had VT and his AICD fired. He was admitted to the ICU and was found to have an NSTEMI (trop >65), aspiration pneumonia, and Klebsiella UTI. He underwent left heart catheterization which showed multivessel disease with a 90% stenosis of the prox LAD to mid LAD, totally occluded RCA, and a 90% stenosed distal circ. He had a DES placed in the distal Cx on 11/01/15. His echocardiogram demonstrated an EF of 15-20% with akinesis of the apical myocardium and significant LV dilation. He was discharged to SNF where he stayed for 20 days and was discharged to home. On the day of admission, he had a mechanical fall while trying to open a door. He was found to have a right femoral neck fracture. Cardiology and orthopedics have been consulted. He is moderate to high risk for surgery and cannot stop his aspirin or plavix. Patient would like to pursue surgery.  He subsequently underwent R-hip hemiarthroplasty on 12/18/15 performed by Dr. Darcella Cheshire.   Discharge Diagnoses:  Right femoral neck fractures/p right hemiarthroplasty on 8/27 by Dr. Sherlean Foot - Pain control - Mobilize with PT/OT > recommending SNF - SW consult placed  Multivessel CAD s/p NSTEMI  with peak Troponin >65 s/p DES to distal Cx on 11/01/2015 - Continue aspirin, plavix, low dose beta blocker, and statin - Cardiology consult appreciated - Patient has been chest free - NTG prn  Ischemic Cardiomyopathy w/ Systolic and Diastolic CHF, followed by Dr. Gala Romney.  - 7/8 echocardiogram EF 15-20% - continue BB, digoxin - Appears to be eating and drinking better today, but appears euvolemic on exam - Continue to hold diuretics during the hospitalization; resume torsemide after d/c - not on ACEI/ARB due to CKD  Ventricular Tachycardia - IACD Fired in ED 7/6 -Amiodarone 200 mg BID  Probable Partial Seizure with left arm shaking.  - F/u with Neurology on 8/31. - Not on seizure medication   H/O CVA - Mild persistentright-sided weakness   CKD III  - Minimize nephrotoxins and renally dose medications  -baseline creatinine 1.5-1.8  Mild hyperkalemia, asymptomatic, likely related to spironolactone use in setting of CKD - Hold spironolactone during the hospitalization-->K improved -restart spironolactone after d/--recheck BMP on 12/27/15  DM Type 2 uncontrolled  -10/27/15 Hemoglobin A1c 12.0 - increase Levemir to 15 units (home is 20 units)--d/c with Levemir 20 units daily - continue low dose SSI - Add standing aspart with meals--increase to 7 units with meals  Left foot diabetic ulcer -Left plantar surface ulcer lateral aspect. Santyl ointment BID  Multiple abrasions: Apply vaseline and cover with dry bandage  Leukocytosis, likely related to fall - No pneumonia on CXR - UA not obtained, asymptomatic  Normocyticanemia, likely due to CKD,  -hemoglobin at baseline of 10-11mg /dl. Occult negative.  Stage 1 pressure ulcer on sacrum, present at time of admission  Severe protein calorie  malnutrition - Supplements  - Liberalize diet  Hyperlipidemia -continue Crestor   Discharge Instructions  Discharge Instructions    AMB  Referral to [facility] Care Management    Complete by:  As directed   Reason for consult:  Active patient with [facility], follow up a rehab for return home assessment of transition needs   Expected date of contact:  1-3 days (reserved for hospital discharges)   Please assign patient to social worker for post hospital follow up at skilled facility.  Patient's current disposition is to [facility] for rehab.  Patient already assigned to community nurse for transition of care calls and assess for home visits for dc from facility. Questions please call:   Charlesetta Shanks, RN BSN CCM [facility]  [phone] business mobile phone Toll free office [phone]   Ambulatory referral to Neurology    Complete by:  As directed   Refer to [facility] Neurology;  Missed appointment on 8/31 due to hospitalization   Diet - low sodium heart healthy    Complete by:  As directed   Increase activity slowly    Complete by:  As directed       Medication List    STOP taking these medications   ipratropium-albuterol 0.5-2.5 (3) MG/3ML Soln Commonly known as:  DUONEB   Nystatin Powd     TAKE these medications   amiodarone 200 MG tablet Commonly known as:  PACERONE Take 1 tablet (200 mg total) by mouth daily.   aspirin EC 81 MG tablet Take 81 mg by mouth daily.   carvedilol 3.125 MG tablet Commonly known as:  COREG Take 1/2 tablet by mouth twice daily. What changed:  how much to take  how to take this  when to take this  additional instructions   clopidogrel 75 MG tablet Commonly known as:  PLAVIX Take 1 tablet (75 mg total) by mouth daily.   collagenase ointment Commonly known as:  SANTYL Apply topically 2 (two) times daily.   dextromethorphan-guaiFENesin 30-600 MG 12hr tablet Commonly known as:  MUCINEX DM Take 1 tablet by mouth daily.   digoxin 0.125 MG tablet Commonly known as:  LANOXIN Take 0.125 mg by mouth daily.   HYDROcodone-acetaminophen 5-325  MG tablet Commonly known as:  NORCO/VICODIN Take 1-2 tablets by mouth every 6 (six) hours as needed for moderate pain.   insulin detemir 100 UNIT/ML injection Commonly known as:  LEVEMIR Inject 20 Units into the skin at bedtime.   mirtazapine 15 MG tablet Commonly known as:  REMERON TAKE 1 TABLET(15 MG) BY MOUTH AT BEDTIME   multivitamin-iron-minerals-folic acid Tabs tablet Take 1 tablet by mouth daily.   NOVOLOG FLEXPEN 100 UNIT/ML FlexPen Generic drug:  insulin aspart Inject 5-15 Units into the skin 3 (three) times daily with meals.   rosuvastatin 40 MG tablet Commonly known as:  CRESTOR TAKE 1 TABLET BY MOUTH DAILY What changed:  See the new instructions.   spironolactone 25 MG tablet Commonly known as:  ALDACTONE Take 0.5 tablets (12.5 mg total) by mouth daily.   tamsulosin 0.4 MG Caps capsule Commonly known as:  FLOMAX Take 1 capsule (0.4 mg total) by mouth daily.   torsemide 20 MG tablet Commonly known as:  DEMADEX Take 1 tablet (20 mg total) by mouth daily.   vitamin A 51884 UNIT capsule Take 10,000 Units by mouth daily.   vitamin C 500 MG tablet Commonly known as:  ASCORBIC ACID Take 500 mg by mouth daily.   zinc sulfate  220 (50 Zn) MG capsule Take 220 mg by mouth daily.       Contact information for follow-up providers    LUCEY,STEPHEN D, MD Follow up in 2 week(s).   Specialty:  Orthopedic Surgery Contact information: [address] [place] [address] [phone]        Arvilla Meres, MD Follow up in 1 week(s).   Specialty:  Cardiology Contact information: [address] [place] [address] [phone]            Contact information for after-discharge care    Destination    [facility] .   Specialty:  Skilled Nursing Facility Contact information: [address] [place] [address] [phone]                 Allergies  Allergen Reactions  .  Sulfa Antibiotics Rash    Consultations:  Cardiology  Ortho--Dr. Sherlean Foot   Procedures/Studies: Dg Chest 2 View  Result Date: 12/16/2015 CLINICAL DATA:  Fall yesterday, hip fracture. EXAM: CHEST  2 VIEW COMPARISON:  Chest x-rays dated 11/28/2015 and 11/04/2015. FINDINGS: Mild cardiomegaly is stable. Left chest wall pacemaker/ICD in place. Lungs are clear. No evidence of pneumonia. No pleural effusion or pneumothorax seen. Osseous structures about the chest are unremarkable. IMPRESSION: No active cardiopulmonary disease.  Stable mild cardiomegaly. Electronically Signed   By: Bary Richard M.D.   On: 12/16/2015 14:22   Dg Chest 2 View  Result Date: 11/28/2015 CLINICAL DATA:  Productive cough. Low-grade fever for the past 4 days. EXAM: CHEST  2 VIEW COMPARISON:  11/04/2015. FINDINGS: Normal sized heart. Clear lungs. The interstitial markings are mildly prominent. Stable left subclavian AICD lead. Unremarkable bones. IMPRESSION: No acute abnormality.  Mild chronic interstitial lung disease. Electronically Signed   By: Beckie Salts M.D.   On: 11/28/2015 17:17   Ct Head Wo Contrast  Result Date: 12/16/2015 CLINICAL DATA:  fall fall at or. EXAM: CT HEAD WITHOUT CONTRAST TECHNIQUE: Contiguous axial images were obtained from the base of the skull through the vertex without intravenous contrast. COMPARISON:  CT 11/21/2015 FINDINGS: Brain: No acute intracranial hemorrhage. No focal mass lesion. No CT evidence of acute infarction. No midline shift or mass effect. No hydrocephalus. Basilar cisterns are patent. There are periventricular and subcortical white matter hypodensities. Generalized cortical atrophy. Vascular: Unremarkable Skull: No calvarial lesion Sinuses/Orbits: Paranasal sinuses and mastoid air cells are clear. Orbits are clear. Other: None IMPRESSION: 1. No acute intracranial findings. 2. Atrophy and chronic white matter microvascular disease. Electronically Signed   By: Genevive Bi M.D.   On:  12/16/2015 13:56   Dg Hip Unilat  With Pelvis 2-3 Views Right  Result Date: 12/16/2015 CLINICAL DATA:  Larey Seat last night.  Right hip pain. EXAM: DG HIP (WITH OR WITHOUT PELVIS) 2-3V RIGHT COMPARISON:  None. FINDINGS: There is a fracture of the right femoral neck. Small comminuted fracture components are noted. Primary fracture components are displaced, with the shaft component migrating superiorly by approximately 2 cm. There is mild varus angulation and mild apex anterior angulation. No other fractures. Hip joints are normally aligned as are the SI joints and symphysis pubis. Bones are demineralized. IMPRESSION: Displaced, mildly angulated, right femoral neck fracture. Electronically Signed   By: Amie Portland M.D.   On: 12/16/2015 12:55        Discharge Exam: Vitals:   12/22/15 0930 12/22/15 1025  BP: 105/61   Pulse: 75 80  Resp:    Temp:  97.7 F (36.5 C)    Vitals:   12/22/15 0551 12/22/15 0836 12/22/15 0930 12/22/15 1025  BP: 117/60 (!) 101/58 105/61   Pulse: 82 88 75 80  Resp: 18     Temp: 98.2 F (36.8 C)  97.7 F (36.5 C)   TempSrc: Oral  Oral   SpO2: 98%  98%   Weight: 72.6 kg (160 lb)     Height:        General: Pt is alert, awake, not in acute distress Cardiovascular: RRR, S1/S2 +, no rubs, no gallops Respiratory: CTA bilaterally, no wheezing, no rhonchi Abdominal: Soft, NT, ND, bowel sounds + Extremities: no edema, no cyanosis   The results of significant diagnostics from this hospitalization (including imaging, microbiology, ancillary and laboratory) are listed below for reference.    Significant Diagnostic Studies: Dg Chest 2 View  Result Date: 12/16/2015 CLINICAL DATA:  Fall yesterday, hip fracture. EXAM: CHEST  2 VIEW COMPARISON:  Chest x-rays dated 11/28/2015 and 11/04/2015. FINDINGS: Mild cardiomegaly is stable. Left chest wall pacemaker/ICD in place. Lungs are clear. No evidence of pneumonia. No pleural effusion or pneumothorax seen. Osseous structures  about the chest are unremarkable. IMPRESSION: No active cardiopulmonary disease.  Stable mild cardiomegaly. Electronically Signed   By: Bary Richard M.D.   On: 12/16/2015 14:22   Dg Chest 2 View  Result Date: 11/28/2015 CLINICAL DATA:  Productive cough. Low-grade fever for the past 4 days. EXAM: CHEST  2 VIEW COMPARISON:  11/04/2015. FINDINGS: Normal sized heart. Clear lungs. The interstitial markings are mildly prominent. Stable left subclavian AICD lead. Unremarkable bones. IMPRESSION: No acute abnormality.  Mild chronic interstitial lung disease. Electronically Signed   By: Beckie Salts M.D.   On: 11/28/2015 17:17   Ct Head Wo Contrast  Result Date: 12/16/2015 CLINICAL DATA:  fall fall at or. EXAM: CT HEAD WITHOUT CONTRAST TECHNIQUE: Contiguous axial images were obtained from the base of the skull through the vertex without intravenous contrast. COMPARISON:  CT 11/21/2015 FINDINGS: Brain: No acute intracranial hemorrhage. No focal mass lesion. No CT evidence of acute infarction. No midline shift or mass effect. No hydrocephalus. Basilar cisterns are patent. There are periventricular and subcortical white matter hypodensities. Generalized cortical atrophy. Vascular: Unremarkable Skull: No calvarial lesion Sinuses/Orbits: Paranasal sinuses and mastoid air cells are clear. Orbits are clear. Other: None IMPRESSION: 1. No acute intracranial findings. 2. Atrophy and chronic white matter microvascular disease. Electronically Signed   By: Genevive Bi M.D.   On: 12/16/2015 13:56   Dg Hip Unilat  With Pelvis 2-3 Views Right  Result Date: 12/16/2015 CLINICAL DATA:  Larey Seat last night.  Right hip pain. EXAM: DG HIP (WITH OR WITHOUT PELVIS) 2-3V RIGHT COMPARISON:  None. FINDINGS: There is a fracture of the right femoral neck. Small comminuted fracture components are noted. Primary fracture components are displaced, with the shaft component migrating superiorly by approximately 2 cm. There is mild varus  angulation and mild apex anterior angulation. No other fractures. Hip joints are normally aligned as are the SI joints and symphysis pubis. Bones are demineralized. IMPRESSION: Displaced, mildly angulated, right femoral neck fracture. Electronically Signed   By: Amie Portland M.D.   On: 12/16/2015 12:55     Microbiology: Recent Results (from the past 240 hour(s))  MRSA PCR Screening     Status: None   Collection Time: 12/18/15  4:51 AM  Result Value Ref Range Status   MRSA by PCR NEGATIVE NEGATIVE Final    Comment:  The GeneXpert MRSA Assay (FDA approved for NASAL specimens only), is one component of a comprehensive MRSA colonization surveillance program. It is not intended to diagnose MRSA infection nor to guide or monitor treatment for MRSA infections.      Labs: Basic Metabolic Panel:  Recent Labs Lab 12/18/15 0333 12/19/15 0640 12/20/15 0347 12/21/15 0259 12/22/15 0322  NA 137 134* 134* 138 138  K 4.6 5.1 4.7 4.7 4.3  CL 106 100* 102 105 106  CO2 26 24 23 26 25   GLUCOSE 101* 190* 181* 177* 134*  BUN 30* 34* 41* 42* 40*  CREATININE 1.78* 1.68* 1.81* 1.75* 1.78*  CALCIUM 8.4* 8.4* 8.2* 8.2* 8.2*   Liver Function Tests:  Recent Labs Lab 12/16/15 1335  AST 67*  ALT 61  ALKPHOS 146*  BILITOT 0.7  PROT 7.4  ALBUMIN 3.0*   No results for input(s): LIPASE, AMYLASE in the last 168 hours. No results for input(s): AMMONIA in the last 168 hours. CBC:  Recent Labs Lab 12/16/15 1335  12/18/15 0333 12/19/15 0640 12/20/15 0347 12/21/15 0259 12/22/15 0322  WBC 13.4*  < > 10.6* 12.6* 12.5* 11.4* 11.2*  NEUTROABS 11.4*  --   --   --   --   --   --   HGB 11.0*  < > 10.6* 10.2* 9.9* 9.4* 9.2*  HCT 36.2*  < > 34.8* 32.8* 31.9* 30.6* 29.9*  MCV 91.6  < > 91.8 90.1 90.4 90.0 90.1  PLT 168  < > 165 154 155 186 214  < > = values in this interval not displayed. Cardiac Enzymes: No results for input(s): CKTOTAL, CKMB, CKMBINDEX, TROPONINI in the last 168  hours. BNP: Invalid input(s): POCBNP CBG:  Recent Labs Lab 12/21/15 0552 12/21/15 1130 12/21/15 1632 12/21/15 2058 12/22/15 0604  GLUCAP 133* 189* 315* 215* 113*    Time coordinating discharge:  Greater than 30 minutes  Signed:  Elliot Simoneaux, DO [facility] Pager: [phone] 12/22/2015, 10:54 AM

## 2015-12-22 ENCOUNTER — Encounter (HOSPITAL_COMMUNITY): Payer: Commercial Managed Care - HMO | Admitting: Internal Medicine

## 2015-12-22 ENCOUNTER — Encounter: Payer: Self-pay | Admitting: *Deleted

## 2015-12-22 DIAGNOSIS — I130 Hypertensive heart and chronic kidney disease with heart failure and stage 1 through stage 4 chronic kidney disease, or unspecified chronic kidney disease: Secondary | ICD-10-CM | POA: Diagnosis not present

## 2015-12-22 DIAGNOSIS — S72001A Fracture of unspecified part of neck of right femur, initial encounter for closed fracture: Secondary | ICD-10-CM | POA: Diagnosis not present

## 2015-12-22 DIAGNOSIS — E119 Type 2 diabetes mellitus without complications: Secondary | ICD-10-CM | POA: Diagnosis not present

## 2015-12-22 DIAGNOSIS — I2510 Atherosclerotic heart disease of native coronary artery without angina pectoris: Secondary | ICD-10-CM | POA: Diagnosis not present

## 2015-12-22 DIAGNOSIS — G4730 Sleep apnea, unspecified: Secondary | ICD-10-CM | POA: Diagnosis not present

## 2015-12-22 DIAGNOSIS — E43 Unspecified severe protein-calorie malnutrition: Secondary | ICD-10-CM | POA: Diagnosis not present

## 2015-12-22 DIAGNOSIS — I10 Essential (primary) hypertension: Secondary | ICD-10-CM | POA: Diagnosis not present

## 2015-12-22 DIAGNOSIS — M109 Gout, unspecified: Secondary | ICD-10-CM | POA: Diagnosis not present

## 2015-12-22 DIAGNOSIS — R41841 Cognitive communication deficit: Secondary | ICD-10-CM | POA: Diagnosis not present

## 2015-12-22 DIAGNOSIS — N189 Chronic kidney disease, unspecified: Secondary | ICD-10-CM | POA: Diagnosis not present

## 2015-12-22 DIAGNOSIS — N184 Chronic kidney disease, stage 4 (severe): Secondary | ICD-10-CM | POA: Diagnosis not present

## 2015-12-22 DIAGNOSIS — N312 Flaccid neuropathic bladder, not elsewhere classified: Secondary | ICD-10-CM | POA: Diagnosis not present

## 2015-12-22 DIAGNOSIS — I252 Old myocardial infarction: Secondary | ICD-10-CM | POA: Diagnosis not present

## 2015-12-22 DIAGNOSIS — I255 Ischemic cardiomyopathy: Secondary | ICD-10-CM | POA: Diagnosis not present

## 2015-12-22 DIAGNOSIS — I214 Non-ST elevation (NSTEMI) myocardial infarction: Secondary | ICD-10-CM | POA: Diagnosis not present

## 2015-12-22 DIAGNOSIS — E1122 Type 2 diabetes mellitus with diabetic chronic kidney disease: Secondary | ICD-10-CM | POA: Diagnosis not present

## 2015-12-22 DIAGNOSIS — G629 Polyneuropathy, unspecified: Secondary | ICD-10-CM | POA: Diagnosis not present

## 2015-12-22 DIAGNOSIS — Z881 Allergy status to other antibiotic agents status: Secondary | ICD-10-CM | POA: Diagnosis not present

## 2015-12-22 DIAGNOSIS — R569 Unspecified convulsions: Secondary | ICD-10-CM

## 2015-12-22 DIAGNOSIS — S72001D Fracture of unspecified part of neck of right femur, subsequent encounter for closed fracture with routine healing: Secondary | ICD-10-CM | POA: Diagnosis not present

## 2015-12-22 DIAGNOSIS — J9690 Respiratory failure, unspecified, unspecified whether with hypoxia or hypercapnia: Secondary | ICD-10-CM | POA: Diagnosis not present

## 2015-12-22 DIAGNOSIS — M1612 Unilateral primary osteoarthritis, left hip: Secondary | ICD-10-CM | POA: Diagnosis not present

## 2015-12-22 DIAGNOSIS — Z8261 Family history of arthritis: Secondary | ICD-10-CM | POA: Diagnosis not present

## 2015-12-22 DIAGNOSIS — W19XXXD Unspecified fall, subsequent encounter: Secondary | ICD-10-CM | POA: Diagnosis not present

## 2015-12-22 DIAGNOSIS — N320 Bladder-neck obstruction: Secondary | ICD-10-CM | POA: Diagnosis not present

## 2015-12-22 DIAGNOSIS — G8190 Hemiplegia, unspecified affecting unspecified side: Secondary | ICD-10-CM | POA: Diagnosis not present

## 2015-12-22 DIAGNOSIS — Z96651 Presence of right artificial knee joint: Secondary | ICD-10-CM | POA: Diagnosis not present

## 2015-12-22 DIAGNOSIS — I5022 Chronic systolic (congestive) heart failure: Secondary | ICD-10-CM | POA: Diagnosis not present

## 2015-12-22 DIAGNOSIS — I5042 Chronic combined systolic (congestive) and diastolic (congestive) heart failure: Secondary | ICD-10-CM | POA: Diagnosis not present

## 2015-12-22 DIAGNOSIS — I472 Ventricular tachycardia: Secondary | ICD-10-CM | POA: Diagnosis not present

## 2015-12-22 DIAGNOSIS — R4181 Age-related cognitive decline: Secondary | ICD-10-CM | POA: Diagnosis not present

## 2015-12-22 DIAGNOSIS — W19XXXA Unspecified fall, initial encounter: Secondary | ICD-10-CM | POA: Diagnosis not present

## 2015-12-22 DIAGNOSIS — I270 Primary pulmonary hypertension: Secondary | ICD-10-CM | POA: Diagnosis not present

## 2015-12-22 DIAGNOSIS — Z955 Presence of coronary angioplasty implant and graft: Secondary | ICD-10-CM | POA: Diagnosis not present

## 2015-12-22 DIAGNOSIS — Z7982 Long term (current) use of aspirin: Secondary | ICD-10-CM | POA: Diagnosis not present

## 2015-12-22 DIAGNOSIS — N183 Chronic kidney disease, stage 3 (moderate): Secondary | ICD-10-CM | POA: Diagnosis not present

## 2015-12-22 DIAGNOSIS — E11621 Type 2 diabetes mellitus with foot ulcer: Secondary | ICD-10-CM | POA: Diagnosis not present

## 2015-12-22 DIAGNOSIS — Z4789 Encounter for other orthopedic aftercare: Secondary | ICD-10-CM | POA: Diagnosis not present

## 2015-12-22 DIAGNOSIS — I509 Heart failure, unspecified: Secondary | ICD-10-CM | POA: Diagnosis not present

## 2015-12-22 DIAGNOSIS — I5043 Acute on chronic combined systolic (congestive) and diastolic (congestive) heart failure: Secondary | ICD-10-CM | POA: Diagnosis not present

## 2015-12-22 DIAGNOSIS — Z794 Long term (current) use of insulin: Secondary | ICD-10-CM | POA: Diagnosis not present

## 2015-12-22 DIAGNOSIS — N401 Enlarged prostate with lower urinary tract symptoms: Secondary | ICD-10-CM | POA: Diagnosis not present

## 2015-12-22 DIAGNOSIS — M25561 Pain in right knee: Secondary | ICD-10-CM | POA: Diagnosis not present

## 2015-12-22 DIAGNOSIS — D631 Anemia in chronic kidney disease: Secondary | ICD-10-CM | POA: Diagnosis not present

## 2015-12-22 DIAGNOSIS — R972 Elevated prostate specific antigen [PSA]: Secondary | ICD-10-CM | POA: Diagnosis not present

## 2015-12-22 DIAGNOSIS — Z8249 Family history of ischemic heart disease and other diseases of the circulatory system: Secondary | ICD-10-CM | POA: Diagnosis not present

## 2015-12-22 DIAGNOSIS — Z8673 Personal history of transient ischemic attack (TIA), and cerebral infarction without residual deficits: Secondary | ICD-10-CM | POA: Diagnosis not present

## 2015-12-22 DIAGNOSIS — M6281 Muscle weakness (generalized): Secondary | ICD-10-CM | POA: Diagnosis not present

## 2015-12-22 DIAGNOSIS — Z95810 Presence of automatic (implantable) cardiac defibrillator: Secondary | ICD-10-CM | POA: Diagnosis not present

## 2015-12-22 DIAGNOSIS — R3915 Urgency of urination: Secondary | ICD-10-CM | POA: Diagnosis not present

## 2015-12-22 DIAGNOSIS — S72009A Fracture of unspecified part of neck of unspecified femur, initial encounter for closed fracture: Secondary | ICD-10-CM | POA: Diagnosis not present

## 2015-12-22 DIAGNOSIS — Z96641 Presence of right artificial hip joint: Secondary | ICD-10-CM | POA: Diagnosis not present

## 2015-12-22 DIAGNOSIS — I639 Cerebral infarction, unspecified: Secondary | ICD-10-CM | POA: Diagnosis not present

## 2015-12-22 DIAGNOSIS — R262 Difficulty in walking, not elsewhere classified: Secondary | ICD-10-CM | POA: Diagnosis not present

## 2015-12-22 DIAGNOSIS — R278 Other lack of coordination: Secondary | ICD-10-CM | POA: Diagnosis not present

## 2015-12-22 LAB — GLUCOSE, CAPILLARY
Glucose-Capillary: 113 mg/dL — ABNORMAL HIGH (ref 65–99)
Glucose-Capillary: 202 mg/dL — ABNORMAL HIGH (ref 65–99)

## 2015-12-22 LAB — CBC
HCT: 29.9 % — ABNORMAL LOW (ref 39.0–52.0)
Hemoglobin: 9.2 g/dL — ABNORMAL LOW (ref 13.0–17.0)
MCH: 27.7 pg (ref 26.0–34.0)
MCHC: 30.8 g/dL (ref 30.0–36.0)
MCV: 90.1 fL (ref 78.0–100.0)
PLATELETS: 214 10*3/uL (ref 150–400)
RBC: 3.32 MIL/uL — AB (ref 4.22–5.81)
RDW: 15.3 % (ref 11.5–15.5)
WBC: 11.2 10*3/uL — ABNORMAL HIGH (ref 4.0–10.5)

## 2015-12-22 LAB — BASIC METABOLIC PANEL
Anion gap: 7 (ref 5–15)
BUN: 40 mg/dL — ABNORMAL HIGH (ref 6–20)
CHLORIDE: 106 mmol/L (ref 101–111)
CO2: 25 mmol/L (ref 22–32)
CREATININE: 1.78 mg/dL — AB (ref 0.61–1.24)
Calcium: 8.2 mg/dL — ABNORMAL LOW (ref 8.9–10.3)
GFR calc Af Amer: 43 mL/min — ABNORMAL LOW (ref >60)
GFR, EST NON AFRICAN AMERICAN: 37 mL/min — AB (ref >60)
Glucose, Bld: 134 mg/dL — ABNORMAL HIGH (ref 65–99)
POTASSIUM: 4.3 mmol/L (ref 3.5–5.1)
Sodium: 138 mmol/L (ref 135–145)

## 2015-12-22 MED ORDER — HYDROCODONE-ACETAMINOPHEN 5-325 MG PO TABS: 1.0000 | ORAL_TABLET | Freq: Four times a day (QID) | ORAL | 0 refills | Status: DC | PRN

## 2015-12-22 NOTE — Progress Notes (Signed)
Patient discharged to SNF ([facility]), attempted to give report, was on hold for 63mins. Called back and gave contact number for nurse to call back when ready for report. Discharge med list discussed with patient and family member. Verbalized understanding.

## 2015-12-22 NOTE — Progress Notes (Signed)
Report given to Charlton Haws, LPN at [facility].

## 2015-12-22 NOTE — Care Management Important Message (Signed)
Important Message  Patient Details  Name: [name] [name] MRN: [id number] Date of Birth: [birth year]   Medicare Important Message Given:  Yes    [name] 12/22/2015, 10:31 AM

## 2015-12-22 NOTE — Clinical Social Work Note (Signed)
CSW facilitated patient discharge including contacting patient family and facility to confirm patient discharge plans. Clinical information faxed to facility and family agreeable with plan. CSW arranged ambulance transport via [facility] to [facility]. RN to call report prior to discharge [phone] Room 214).  CSW will sign off for now as social work intervention is no longer needed. Please consult [place] again if new needs arise.  Dayton Scrape, [place]

## 2015-12-22 NOTE — Clinical Social Work Placement (Signed)
   CLINICAL SOCIAL WORK PLACEMENT  NOTE  Date:  12/22/2015  Patient Details  Name: [name] [name] MRN: [id number] Date of Birth: 11-Sep-1945  Clinical Social Work is seeking post-discharge placement for this patient at the [place] level of care (*CSW will initial, date and re-position this form in  chart as items are completed):  Yes   Patient/family provided with [place] Work Department's list of facilities offering this level of care within the geographic area requested by the patient (or if unable, by the patient's family).  Yes   Patient/family informed of their freedom to choose among providers that offer the needed level of care, that participate in Medicare, Medicaid or managed care program needed by the patient, have an available bed and are willing to accept the patient.  Yes   Patient/family informed of [facility]'s ownership interest in [facility] and [facility], as well as of the fact that they are under no obligation to receive care at these facilities.  PASRR submitted to EDS on 12/20/15     PASRR number received on       Existing PASRR number confirmed on 12/20/15     FL2 transmitted to all facilities in geographic area requested by pt/family on 12/20/15     FL2 transmitted to all facilities within larger geographic area on       Patient informed that his/her managed care company has contracts with or will negotiate with certain facilities, including the following:        Yes   Patient/family informed of bed offers received.  Patient chooses bed at [facility]     Physician recommends and patient chooses bed at      Patient to be transferred to [facility] on 12/22/15.  Patient to be transferred to facility by [facility]     Patient family notified on 12/22/15 of transfer.  Name of family member notified:  [name]     PHYSICIAN Please prepare prescriptions     Additional Comment:     _______________________________________________ Candie Chroman, LCSW 12/22/2015, 1:31 PM

## 2015-12-22 NOTE — Clinical Social Work Note (Addendum)
CSW called [facility] to start authorization for discharge to SNF today.  Dayton Scrape, [place]  12:02 pm Patient's daughter, [name], will go to [facility] at 1:30 pm to do paperwork. Still waiting on insurance authorization.  Dayton Scrape, [place] [phone]  2:07 pm Authorization obtained: [id number]  Dayton Scrape, [place]

## 2015-12-22 NOTE — Consult Note (Signed)
   [facility] Inpatient Consult   12/22/2015  [name] [name] 1945-08-14 [id number]  Update:  Patient underwent a right femoral hemiarthroplasty on 12/18/15 and is scheduled to DC to a skilled nursing facility for rehabilitation.  Met with patient who consents to ongoing post hospital follow up and care management services with [place] [facility].  Community Care Management will follow up on transition of care needs.  For questions, please contact:  Natividad Brood, RN BSN [facility] Liaison  [phone] business mobile phone Toll free office [phone]

## 2015-12-23 ENCOUNTER — Other Ambulatory Visit: Payer: Self-pay | Admitting: *Deleted

## 2015-12-23 DIAGNOSIS — I214 Non-ST elevation (NSTEMI) myocardial infarction: Secondary | ICD-10-CM | POA: Diagnosis not present

## 2015-12-23 DIAGNOSIS — I5043 Acute on chronic combined systolic (congestive) and diastolic (congestive) heart failure: Secondary | ICD-10-CM | POA: Diagnosis not present

## 2015-12-23 DIAGNOSIS — W19XXXD Unspecified fall, subsequent encounter: Secondary | ICD-10-CM | POA: Diagnosis not present

## 2015-12-23 DIAGNOSIS — S72001D Fracture of unspecified part of neck of right femur, subsequent encounter for closed fracture with routine healing: Secondary | ICD-10-CM | POA: Diagnosis not present

## 2015-12-23 NOTE — Patient Outreach (Signed)
[facility]) Care Management [place] Care Coordination Outreach 12/23/2015  [name] [birth year] [id number]  [name] [name] is a 70 y/o male referred to [facility] for transition of care after recent hospitalization August 25-31, 2017 for (R) hip fracture that patient experienced after falling while at [facility] for rehabilitation from previous hospitalization.  Patient experienced NSTEMI during most recent hospitalization, and had (R) hip hemiarthroplasty on December 18, 2015 and was discharged on December 22, 2015 to [facility] SNF for rehabilitation.  Patient also noted to have history of CAD, cardiomyopathy, AICD, CVA, DM, HTN, and CKD.  Secure care coordination communication sent via EMR to [facility] to notify of patient's discharge to SNF for [facility] CSW to follow while patient is in SNF.  Plan:  Will update patient's primary [facility] RN CM of patient's discharge to [facility] SNF on December 22, 2015  Oneta Rack, RN, BSN, [place] Care Management  [phone]

## 2015-12-27 ENCOUNTER — Other Ambulatory Visit: Payer: Self-pay | Admitting: *Deleted

## 2015-12-28 ENCOUNTER — Encounter: Payer: Self-pay | Admitting: *Deleted

## 2015-12-28 ENCOUNTER — Telehealth: Payer: Self-pay | Admitting: Family Medicine

## 2015-12-28 NOTE — Telephone Encounter (Signed)
Pt need Rx for bedrails at [facility].  The facility will not provide unless they have a Rx.

## 2015-12-28 NOTE — Patient Outreach (Signed)
[facility]) Care Management  12/28/2015  [name] [name] 1946/01/17 [id number]   CSW was able to make contact with patient today to perform the initial assessment, as well as assess and assist with social work needs and services, when [name] [name] met with patient at [facility] [place] where patient currently resides to receive short-term rehabilitative services.  CSW introduced self, explained role and types of services provided through [facility] ([facility]).  CSW further explained to patient that CSW works with patient's RNCM, also with [facility], [name]. CSW then explained the reason for the call, indicating that Mrs. [name] [name] thought that patient would benefit from social work services and resources to assist with possible discharge planning needs and services from the skilled nursing facility.  CSW obtained two HIPAA compliant identifiers from patient, which included patient's name and date of birth. Patient vaguely remembered having met with CSW during his previous stay at [facility].  However, patient appeared to be a bit confused today, wanting to get out of bed, sit up, appearing to be quite fidgety and unable to get settled or situated.  Patient denied being in any pain, just unable to find a comfortable position. Patient is optimistic that he will be able to return home to live with his wife, [name] [name] and resume home health services, upon release from the skilled facility.  CSW will continue to follow patient while at [facility] to assist with discharge planning needs and services.  CSW will plan to meet with patient for the next visit at the skilled facility on Thursday, September 14th. [name], BSW, MSW, LCSW  Licensed Education officer, [facility]  Mailing [place] [address] [place], [place]  [address] Physical Address-[address], [place], [place] [address] Toll Free Main # [phone] Fax # [phone] Cell # [phone]  Fax # [phone]  [email or web address]

## 2015-12-28 NOTE — Telephone Encounter (Signed)
rx okay?

## 2015-12-29 DIAGNOSIS — R972 Elevated prostate specific antigen [PSA]: Secondary | ICD-10-CM | POA: Diagnosis not present

## 2015-12-29 DIAGNOSIS — N312 Flaccid neuropathic bladder, not elsewhere classified: Secondary | ICD-10-CM | POA: Diagnosis not present

## 2015-12-29 DIAGNOSIS — N401 Enlarged prostate with lower urinary tract symptoms: Secondary | ICD-10-CM | POA: Diagnosis not present

## 2015-12-29 DIAGNOSIS — R3915 Urgency of urination: Secondary | ICD-10-CM | POA: Diagnosis not present

## 2015-12-29 NOTE — Telephone Encounter (Signed)
Called and spoke with [name] [name]. They are not allowed to put bedrails on the bed via state law. Called and relayed information to daughter. She verbalized understanding. Told her to call if they needed anything else.

## 2015-12-29 NOTE — Telephone Encounter (Signed)
OK to authorize both requests. Thanks, BJ

## 2016-01-02 ENCOUNTER — Encounter (HOSPITAL_COMMUNITY): Payer: Self-pay | Admitting: Internal Medicine

## 2016-01-02 ENCOUNTER — Ambulatory Visit (HOSPITAL_COMMUNITY)
Admission: RE | Admit: 2016-01-02 | Discharge: 2016-01-02 | Disposition: A | Discharge status: Home / Self Care | Payer: Commercial Managed Care - HMO | Source: Ambulatory Visit | Attending: Internal Medicine | Admitting: Internal Medicine

## 2016-01-02 VITALS — BP 104/58 | HR 75

## 2016-01-02 DIAGNOSIS — Z881 Allergy status to other antibiotic agents status: Secondary | ICD-10-CM | POA: Insufficient documentation

## 2016-01-02 DIAGNOSIS — I5042 Chronic combined systolic (congestive) and diastolic (congestive) heart failure: Secondary | ICD-10-CM

## 2016-01-02 DIAGNOSIS — G629 Polyneuropathy, unspecified: Secondary | ICD-10-CM | POA: Insufficient documentation

## 2016-01-02 DIAGNOSIS — I130 Hypertensive heart and chronic kidney disease with heart failure and stage 1 through stage 4 chronic kidney disease, or unspecified chronic kidney disease: Secondary | ICD-10-CM | POA: Insufficient documentation

## 2016-01-02 DIAGNOSIS — I252 Old myocardial infarction: Secondary | ICD-10-CM | POA: Insufficient documentation

## 2016-01-02 DIAGNOSIS — E1122 Type 2 diabetes mellitus with diabetic chronic kidney disease: Secondary | ICD-10-CM | POA: Diagnosis not present

## 2016-01-02 DIAGNOSIS — I214 Non-ST elevation (NSTEMI) myocardial infarction: Secondary | ICD-10-CM | POA: Insufficient documentation

## 2016-01-02 DIAGNOSIS — N183 Chronic kidney disease, stage 3 unspecified: Secondary | ICD-10-CM

## 2016-01-02 DIAGNOSIS — Z95810 Presence of automatic (implantable) cardiac defibrillator: Secondary | ICD-10-CM | POA: Insufficient documentation

## 2016-01-02 DIAGNOSIS — Z955 Presence of coronary angioplasty implant and graft: Secondary | ICD-10-CM | POA: Insufficient documentation

## 2016-01-02 DIAGNOSIS — Z96641 Presence of right artificial hip joint: Secondary | ICD-10-CM | POA: Insufficient documentation

## 2016-01-02 DIAGNOSIS — I2510 Atherosclerotic heart disease of native coronary artery without angina pectoris: Secondary | ICD-10-CM | POA: Diagnosis not present

## 2016-01-02 DIAGNOSIS — I472 Ventricular tachycardia: Secondary | ICD-10-CM | POA: Insufficient documentation

## 2016-01-02 DIAGNOSIS — Z8249 Family history of ischemic heart disease and other diseases of the circulatory system: Secondary | ICD-10-CM | POA: Insufficient documentation

## 2016-01-02 DIAGNOSIS — N184 Chronic kidney disease, stage 4 (severe): Secondary | ICD-10-CM | POA: Insufficient documentation

## 2016-01-02 DIAGNOSIS — Z8673 Personal history of transient ischemic attack (TIA), and cerebral infarction without residual deficits: Secondary | ICD-10-CM | POA: Insufficient documentation

## 2016-01-02 DIAGNOSIS — I255 Ischemic cardiomyopathy: Secondary | ICD-10-CM | POA: Insufficient documentation

## 2016-01-02 DIAGNOSIS — M109 Gout, unspecified: Secondary | ICD-10-CM | POA: Insufficient documentation

## 2016-01-02 DIAGNOSIS — Z794 Long term (current) use of insulin: Secondary | ICD-10-CM | POA: Insufficient documentation

## 2016-01-02 DIAGNOSIS — G4730 Sleep apnea, unspecified: Secondary | ICD-10-CM | POA: Insufficient documentation

## 2016-01-02 DIAGNOSIS — Z8261 Family history of arthritis: Secondary | ICD-10-CM | POA: Insufficient documentation

## 2016-01-02 DIAGNOSIS — N320 Bladder-neck obstruction: Secondary | ICD-10-CM | POA: Insufficient documentation

## 2016-01-02 DIAGNOSIS — Z7982 Long term (current) use of aspirin: Secondary | ICD-10-CM | POA: Insufficient documentation

## 2016-01-02 DIAGNOSIS — Z96651 Presence of right artificial knee joint: Secondary | ICD-10-CM | POA: Insufficient documentation
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
## 2016-01-02 NOTE — Progress Notes (Signed)
Advanced Heart Failure Medication Review by a Pharmacist  Does the patient  feel that his/her medications are working for him/her?  yes  Has the patient been experiencing any side effects to the medications prescribed?  no  Does the patient measure his/her own blood pressure or blood glucose at home?  yes   Does the patient have any problems obtaining medications due to transportation or finances?   no  Understanding of regimen: good Understanding of indications: good Potential of compliance: good Patient understands to avoid NSAIDs. Patient understands to avoid decongestants.  Issues to address at subsequent visits: None   Pharmacist comments:  Mr. Tanimoto is a pleasant 70 yo M presenting from [facility] with an updated MAR. No significant discrepancies noted.   Ruta Hinds. Velva Harman, PharmD, BCPS, CPP Clinical Pharmacist Pager: [phone] Phone: [phone] 01/02/2016 3:36 PM      Time with patient: 2 minutes Preparation and documentation time: 8 minutes Total time: 10 minutes

## 2016-01-02 NOTE — Patient Instructions (Signed)
Your physician recommends that you schedule a follow-up appointment in: 6 weeks  

## 2016-01-03 DIAGNOSIS — M1612 Unilateral primary osteoarthritis, left hip: Secondary | ICD-10-CM | POA: Diagnosis not present

## 2016-01-03 DIAGNOSIS — Z96641 Presence of right artificial hip joint: Secondary | ICD-10-CM | POA: Diagnosis not present

## 2016-01-03 DIAGNOSIS — Z96649 Presence of unspecified artificial hip joint: Secondary | ICD-10-CM | POA: Insufficient documentation

## 2016-01-05 ENCOUNTER — Other Ambulatory Visit: Payer: Self-pay | Admitting: *Deleted

## 2016-01-05 NOTE — Patient Outreach (Signed)
[facility]) Care Management  01/05/2016  ZAYDEN MAFFEI 01-23-1946 [id number]   CSW was able to meet with patient today at [facility] [place] where patient currently resides to receive short-term rehabilitative services. Patient did not have much to say to CSW today during the visit.  Patient admitted that he was feeling tired and just wanted to rest today. Patient was not aware of any tentative discharge date.  CSW agreed to follow-up with patient in one week to assess and assist with possible discharge planning needs and services. [name], BSW, MSW, LCSW  Licensed Education officer, environmental Health System  Mailing [place] [address], [place], [place] [address] Physical Address-[address], [place],  [address] Toll Free Main # [phone] Fax # [phone] Cell # [phone]  Fax # [phone]  [email or web address]

## 2016-01-09 ENCOUNTER — Ambulatory Visit (INDEPENDENT_AMBULATORY_CARE_PROVIDER_SITE_OTHER): Payer: Commercial Managed Care - HMO | Admitting: *Deleted

## 2016-01-09 DIAGNOSIS — I255 Ischemic cardiomyopathy: Secondary | ICD-10-CM | POA: Diagnosis not present

## 2016-01-09 NOTE — Progress Notes (Signed)
Remote ICD transmission.   

## 2016-01-11 ENCOUNTER — Encounter (HOSPITAL_BASED_OUTPATIENT_CLINIC_OR_DEPARTMENT_OTHER): Payer: Commercial Managed Care - HMO | Attending: Surgery

## 2016-01-11 ENCOUNTER — Encounter: Payer: Self-pay | Admitting: Cardiology

## 2016-01-12 ENCOUNTER — Encounter: Payer: Self-pay | Admitting: Internal Medicine

## 2016-01-13 DIAGNOSIS — I509 Heart failure, unspecified: Secondary | ICD-10-CM | POA: Diagnosis not present

## 2016-01-13 DIAGNOSIS — N183 Chronic kidney disease, stage 3 (moderate): Secondary | ICD-10-CM | POA: Diagnosis not present

## 2016-01-13 DIAGNOSIS — I270 Primary pulmonary hypertension: Secondary | ICD-10-CM | POA: Diagnosis not present

## 2016-01-13 DIAGNOSIS — S72001A Fracture of unspecified part of neck of right femur, initial encounter for closed fracture: Secondary | ICD-10-CM | POA: Diagnosis not present

## 2016-01-13 DIAGNOSIS — I639 Cerebral infarction, unspecified: Secondary | ICD-10-CM | POA: Diagnosis not present

## 2016-01-16 ENCOUNTER — Other Ambulatory Visit: Payer: Self-pay | Admitting: *Deleted

## 2016-01-16 NOTE — Patient Outreach (Signed)
[facility]) Care Management  01/16/2016  [name] [name] [birth year] [id number]   CSW was able to meet with patient and patient's wife, [name] today at [facility] [place] where patient currently resides to receive short-term rehabilitative services.  CSW was able to have a very pleasant conversation with Mrs. [name] when [name] was called to work with therapies (both physical and occupational).  [name] admitted that patient does not have a tentative discharge date scheduled at this time, as he is still not strong enough to return home or perform activities of daily living independently.  Patient is not able to work as aggressively with therapies as he would like due to serious bruising from a recent fall.  [name] believes that patient will need to reside in the skilled facility for quite a bit longer, admitting that she does not feel comfortable taking him home in his current condition.  CSW agreed to continue to follow patient while at the skilled facility to assess and assist with possible discharge planning needs and services. [name], BSW, MSW, LCSW  Licensed Education officer, environmental Health System  Mailing [place] [address] [place], [place] [address] Physical Address-[address], [place], [place] [address] Toll Free Main # [phone] Fax # [phone] Cell # [phone]  Fax # [phone]  [email or web address]

## 2016-01-22 DIAGNOSIS — Z4789 Encounter for other orthopedic aftercare: Secondary | ICD-10-CM | POA: Diagnosis not present

## 2016-01-22 DIAGNOSIS — R41841 Cognitive communication deficit: Secondary | ICD-10-CM | POA: Diagnosis not present

## 2016-01-22 DIAGNOSIS — E11621 Type 2 diabetes mellitus with foot ulcer: Secondary | ICD-10-CM | POA: Diagnosis not present

## 2016-01-22 DIAGNOSIS — M109 Gout, unspecified: Secondary | ICD-10-CM | POA: Diagnosis not present

## 2016-01-22 DIAGNOSIS — M6281 Muscle weakness (generalized): Secondary | ICD-10-CM | POA: Diagnosis not present

## 2016-01-22 DIAGNOSIS — I2510 Atherosclerotic heart disease of native coronary artery without angina pectoris: Secondary | ICD-10-CM | POA: Diagnosis not present

## 2016-01-22 DIAGNOSIS — E1122 Type 2 diabetes mellitus with diabetic chronic kidney disease: Secondary | ICD-10-CM | POA: Diagnosis not present

## 2016-01-22 DIAGNOSIS — N183 Chronic kidney disease, stage 3 (moderate): Secondary | ICD-10-CM | POA: Diagnosis not present

## 2016-01-22 DIAGNOSIS — D631 Anemia in chronic kidney disease: Secondary | ICD-10-CM | POA: Diagnosis not present

## 2016-01-23 DIAGNOSIS — I2510 Atherosclerotic heart disease of native coronary artery without angina pectoris: Secondary | ICD-10-CM | POA: Diagnosis not present

## 2016-01-23 DIAGNOSIS — M6281 Muscle weakness (generalized): Secondary | ICD-10-CM | POA: Diagnosis not present

## 2016-01-23 DIAGNOSIS — Z4789 Encounter for other orthopedic aftercare: Secondary | ICD-10-CM | POA: Diagnosis not present

## 2016-01-23 DIAGNOSIS — E1122 Type 2 diabetes mellitus with diabetic chronic kidney disease: Secondary | ICD-10-CM | POA: Diagnosis not present

## 2016-01-23 DIAGNOSIS — D631 Anemia in chronic kidney disease: Secondary | ICD-10-CM | POA: Diagnosis not present

## 2016-01-23 DIAGNOSIS — R41841 Cognitive communication deficit: Secondary | ICD-10-CM | POA: Diagnosis not present

## 2016-01-23 DIAGNOSIS — E11621 Type 2 diabetes mellitus with foot ulcer: Secondary | ICD-10-CM | POA: Diagnosis not present

## 2016-01-23 DIAGNOSIS — N183 Chronic kidney disease, stage 3 (moderate): Secondary | ICD-10-CM | POA: Diagnosis not present

## 2016-01-23 DIAGNOSIS — M109 Gout, unspecified: Secondary | ICD-10-CM | POA: Diagnosis not present

## 2016-01-24 DIAGNOSIS — D631 Anemia in chronic kidney disease: Secondary | ICD-10-CM | POA: Diagnosis not present

## 2016-01-24 DIAGNOSIS — M6281 Muscle weakness (generalized): Secondary | ICD-10-CM | POA: Diagnosis not present

## 2016-01-24 DIAGNOSIS — E11621 Type 2 diabetes mellitus with foot ulcer: Secondary | ICD-10-CM | POA: Diagnosis not present

## 2016-01-24 DIAGNOSIS — N183 Chronic kidney disease, stage 3 (moderate): Secondary | ICD-10-CM | POA: Diagnosis not present

## 2016-01-24 DIAGNOSIS — M109 Gout, unspecified: Secondary | ICD-10-CM | POA: Diagnosis not present

## 2016-01-24 DIAGNOSIS — I2510 Atherosclerotic heart disease of native coronary artery without angina pectoris: Secondary | ICD-10-CM | POA: Diagnosis not present

## 2016-01-24 DIAGNOSIS — R41841 Cognitive communication deficit: Secondary | ICD-10-CM | POA: Diagnosis not present

## 2016-01-24 DIAGNOSIS — Z4789 Encounter for other orthopedic aftercare: Secondary | ICD-10-CM | POA: Diagnosis not present

## 2016-01-24 DIAGNOSIS — E1122 Type 2 diabetes mellitus with diabetic chronic kidney disease: Secondary | ICD-10-CM | POA: Diagnosis not present

## 2016-01-25 DIAGNOSIS — R41841 Cognitive communication deficit: Secondary | ICD-10-CM | POA: Diagnosis not present

## 2016-01-25 DIAGNOSIS — M6281 Muscle weakness (generalized): Secondary | ICD-10-CM | POA: Diagnosis not present

## 2016-01-25 DIAGNOSIS — I2510 Atherosclerotic heart disease of native coronary artery without angina pectoris: Secondary | ICD-10-CM | POA: Diagnosis not present

## 2016-01-25 DIAGNOSIS — E1122 Type 2 diabetes mellitus with diabetic chronic kidney disease: Secondary | ICD-10-CM | POA: Diagnosis not present

## 2016-01-25 DIAGNOSIS — Z4789 Encounter for other orthopedic aftercare: Secondary | ICD-10-CM | POA: Diagnosis not present

## 2016-01-25 DIAGNOSIS — E11621 Type 2 diabetes mellitus with foot ulcer: Secondary | ICD-10-CM | POA: Diagnosis not present

## 2016-01-25 DIAGNOSIS — M109 Gout, unspecified: Secondary | ICD-10-CM | POA: Diagnosis not present

## 2016-01-25 DIAGNOSIS — D631 Anemia in chronic kidney disease: Secondary | ICD-10-CM | POA: Diagnosis not present

## 2016-01-25 DIAGNOSIS — N183 Chronic kidney disease, stage 3 (moderate): Secondary | ICD-10-CM | POA: Diagnosis not present

## 2016-01-26 DIAGNOSIS — E1122 Type 2 diabetes mellitus with diabetic chronic kidney disease: Secondary | ICD-10-CM | POA: Diagnosis not present

## 2016-01-26 DIAGNOSIS — M109 Gout, unspecified: Secondary | ICD-10-CM | POA: Diagnosis not present

## 2016-01-26 DIAGNOSIS — M6281 Muscle weakness (generalized): Secondary | ICD-10-CM | POA: Diagnosis not present

## 2016-01-26 DIAGNOSIS — D631 Anemia in chronic kidney disease: Secondary | ICD-10-CM | POA: Diagnosis not present

## 2016-01-26 DIAGNOSIS — N183 Chronic kidney disease, stage 3 (moderate): Secondary | ICD-10-CM | POA: Diagnosis not present

## 2016-01-26 DIAGNOSIS — R41841 Cognitive communication deficit: Secondary | ICD-10-CM | POA: Diagnosis not present

## 2016-01-26 DIAGNOSIS — I2510 Atherosclerotic heart disease of native coronary artery without angina pectoris: Secondary | ICD-10-CM | POA: Diagnosis not present

## 2016-01-26 DIAGNOSIS — Z4789 Encounter for other orthopedic aftercare: Secondary | ICD-10-CM | POA: Diagnosis not present

## 2016-01-26 DIAGNOSIS — E11621 Type 2 diabetes mellitus with foot ulcer: Secondary | ICD-10-CM | POA: Diagnosis not present

## 2016-01-27 DIAGNOSIS — I2510 Atherosclerotic heart disease of native coronary artery without angina pectoris: Secondary | ICD-10-CM | POA: Diagnosis not present

## 2016-01-27 DIAGNOSIS — R41841 Cognitive communication deficit: Secondary | ICD-10-CM | POA: Diagnosis not present

## 2016-01-27 DIAGNOSIS — E11621 Type 2 diabetes mellitus with foot ulcer: Secondary | ICD-10-CM | POA: Diagnosis not present

## 2016-01-27 DIAGNOSIS — M6281 Muscle weakness (generalized): Secondary | ICD-10-CM | POA: Diagnosis not present

## 2016-01-27 DIAGNOSIS — Z4789 Encounter for other orthopedic aftercare: Secondary | ICD-10-CM | POA: Diagnosis not present

## 2016-01-27 DIAGNOSIS — N183 Chronic kidney disease, stage 3 (moderate): Secondary | ICD-10-CM | POA: Diagnosis not present

## 2016-01-27 DIAGNOSIS — E1122 Type 2 diabetes mellitus with diabetic chronic kidney disease: Secondary | ICD-10-CM | POA: Diagnosis not present

## 2016-01-27 DIAGNOSIS — D631 Anemia in chronic kidney disease: Secondary | ICD-10-CM | POA: Diagnosis not present

## 2016-01-27 DIAGNOSIS — M109 Gout, unspecified: Secondary | ICD-10-CM | POA: Diagnosis not present

## 2016-01-29 DIAGNOSIS — M109 Gout, unspecified: Secondary | ICD-10-CM | POA: Diagnosis not present

## 2016-01-29 DIAGNOSIS — D631 Anemia in chronic kidney disease: Secondary | ICD-10-CM | POA: Diagnosis not present

## 2016-01-29 DIAGNOSIS — E11621 Type 2 diabetes mellitus with foot ulcer: Secondary | ICD-10-CM | POA: Diagnosis not present

## 2016-01-29 DIAGNOSIS — M6281 Muscle weakness (generalized): Secondary | ICD-10-CM | POA: Diagnosis not present

## 2016-01-29 DIAGNOSIS — I2510 Atherosclerotic heart disease of native coronary artery without angina pectoris: Secondary | ICD-10-CM | POA: Diagnosis not present

## 2016-01-29 DIAGNOSIS — Z4789 Encounter for other orthopedic aftercare: Secondary | ICD-10-CM | POA: Diagnosis not present

## 2016-01-29 DIAGNOSIS — R41841 Cognitive communication deficit: Secondary | ICD-10-CM | POA: Diagnosis not present

## 2016-01-29 DIAGNOSIS — N183 Chronic kidney disease, stage 3 (moderate): Secondary | ICD-10-CM | POA: Diagnosis not present

## 2016-01-29 DIAGNOSIS — E1122 Type 2 diabetes mellitus with diabetic chronic kidney disease: Secondary | ICD-10-CM | POA: Diagnosis not present

## 2016-01-30 ENCOUNTER — Other Ambulatory Visit: Payer: Self-pay | Admitting: *Deleted

## 2016-01-30 DIAGNOSIS — M6281 Muscle weakness (generalized): Secondary | ICD-10-CM | POA: Diagnosis not present

## 2016-01-30 DIAGNOSIS — M109 Gout, unspecified: Secondary | ICD-10-CM | POA: Diagnosis not present

## 2016-01-30 DIAGNOSIS — D631 Anemia in chronic kidney disease: Secondary | ICD-10-CM | POA: Diagnosis not present

## 2016-01-30 DIAGNOSIS — E11621 Type 2 diabetes mellitus with foot ulcer: Secondary | ICD-10-CM | POA: Diagnosis not present

## 2016-01-30 DIAGNOSIS — R41841 Cognitive communication deficit: Secondary | ICD-10-CM | POA: Diagnosis not present

## 2016-01-30 DIAGNOSIS — E1122 Type 2 diabetes mellitus with diabetic chronic kidney disease: Secondary | ICD-10-CM | POA: Diagnosis not present

## 2016-01-30 DIAGNOSIS — N183 Chronic kidney disease, stage 3 (moderate): Secondary | ICD-10-CM | POA: Diagnosis not present

## 2016-01-30 DIAGNOSIS — Z4789 Encounter for other orthopedic aftercare: Secondary | ICD-10-CM | POA: Diagnosis not present

## 2016-01-30 DIAGNOSIS — I2510 Atherosclerotic heart disease of native coronary artery without angina pectoris: Secondary | ICD-10-CM | POA: Diagnosis not present

## 2016-01-30 NOTE — Patient Outreach (Signed)
[facility]) Care Management  01/30/2016  [name] [name] 1946/04/07 [id number]   CSW was able to meet with patient at [facility] [place] where patient currently resides to receive short-term rehabilitative services, today to perform a routine visit.  Patient appeared to be in good spirits today, talking about returning home to live with his wife, [name] when he becomes a little stronger.  Patient admits to working well with therapies, both physical and occupational, and hopes to being able to schedule a home visit soon to work on safety, mobility, ambulation, strengthening, conditioning, etc., in the home environment.  A tentative discharge date has still not been set for patient at this time.  CSW will plan to meet with patient and patient's wife, [name] on Monday, October 23rd at [facility] to attend patient's Discharge Planning Meeting. [name], BSW, MSW, LCSW  Licensed Education officer, environmental Health System  Mailing [place] [address], [place], [place] [address] Physical Address-[address], [place], [place] [address] Toll Free Main # [phone] Fax # [phone] Cell # [phone]  Fax # [phone]  [email or web address]

## 2016-02-02 DIAGNOSIS — M5135 Other intervertebral disc degeneration, thoracolumbar region: Secondary | ICD-10-CM | POA: Diagnosis not present

## 2016-02-02 DIAGNOSIS — Z471 Aftercare following joint replacement surgery: Secondary | ICD-10-CM | POA: Diagnosis not present

## 2016-02-02 DIAGNOSIS — M1288 Other specific arthropathies, not elsewhere classified, other specified site: Secondary | ICD-10-CM | POA: Diagnosis not present

## 2016-02-02 DIAGNOSIS — M4186 Other forms of scoliosis, lumbar region: Secondary | ICD-10-CM | POA: Diagnosis not present

## 2016-02-02 DIAGNOSIS — Z96641 Presence of right artificial hip joint: Secondary | ICD-10-CM | POA: Diagnosis not present

## 2016-02-02 DIAGNOSIS — M160 Bilateral primary osteoarthritis of hip: Secondary | ICD-10-CM | POA: Diagnosis not present

## 2016-02-02 DIAGNOSIS — M5416 Radiculopathy, lumbar region: Secondary | ICD-10-CM | POA: Diagnosis not present

## 2016-02-02 DIAGNOSIS — M8588 Other specified disorders of bone density and structure, other site: Secondary | ICD-10-CM | POA: Diagnosis not present

## 2016-02-02 DIAGNOSIS — Z96651 Presence of right artificial knee joint: Secondary | ICD-10-CM | POA: Diagnosis not present

## 2016-02-02 DIAGNOSIS — M1711 Unilateral primary osteoarthritis, right knee: Secondary | ICD-10-CM | POA: Diagnosis not present

## 2016-02-02 DIAGNOSIS — M4056 Lordosis, unspecified, lumbar region: Secondary | ICD-10-CM | POA: Diagnosis not present

## 2016-02-02 DIAGNOSIS — M1611 Unilateral primary osteoarthritis, right hip: Secondary | ICD-10-CM | POA: Diagnosis not present

## 2016-02-02 DIAGNOSIS — M11261 Other chondrocalcinosis, right knee: Secondary | ICD-10-CM | POA: Diagnosis not present

## 2016-02-02 DIAGNOSIS — M25561 Pain in right knee: Secondary | ICD-10-CM | POA: Diagnosis not present

## 2016-02-02 DIAGNOSIS — M4316 Spondylolisthesis, lumbar region: Secondary | ICD-10-CM | POA: Diagnosis not present

## 2016-02-02 DIAGNOSIS — M25461 Effusion, right knee: Secondary | ICD-10-CM | POA: Diagnosis not present

## 2016-02-03 ENCOUNTER — Telehealth: Payer: Self-pay | Admitting: Family Medicine

## 2016-02-03 DIAGNOSIS — E1122 Type 2 diabetes mellitus with diabetic chronic kidney disease: Secondary | ICD-10-CM | POA: Diagnosis not present

## 2016-02-03 DIAGNOSIS — R262 Difficulty in walking, not elsewhere classified: Secondary | ICD-10-CM | POA: Diagnosis not present

## 2016-02-03 DIAGNOSIS — M6281 Muscle weakness (generalized): Secondary | ICD-10-CM | POA: Diagnosis not present

## 2016-02-03 DIAGNOSIS — R278 Other lack of coordination: Secondary | ICD-10-CM | POA: Diagnosis not present

## 2016-02-03 DIAGNOSIS — I5022 Chronic systolic (congestive) heart failure: Secondary | ICD-10-CM | POA: Diagnosis not present

## 2016-02-03 DIAGNOSIS — Z4789 Encounter for other orthopedic aftercare: Secondary | ICD-10-CM | POA: Diagnosis not present

## 2016-02-03 DIAGNOSIS — I2510 Atherosclerotic heart disease of native coronary artery without angina pectoris: Secondary | ICD-10-CM | POA: Diagnosis not present

## 2016-02-03 LAB — CUP PACEART REMOTE DEVICE CHECK
Battery Remaining Percentage: 71 %
Brady Statistic RV Percent Paced: 1 % — CL
HighPow Impedance: 43 Ohm
Implantable Lead Implant Date: 20070518
Implantable Lead Location: 753860
Implantable Lead Model: 7001
Lead Channel Setting Pacing Pulse Width: 0.5 ms
MDC IDC MSMT BATTERY REMAINING LONGEVITY: 73 mo
MDC IDC MSMT LEADCHNL RV IMPEDANCE VALUE: 510 Ohm
MDC IDC MSMT LEADCHNL RV SENSING INTR AMPL: 12 mV
MDC IDC SESS DTM: 20171013103102
MDC IDC SET LEADCHNL RV PACING AMPLITUDE: 2.5 V
MDC IDC SET LEADCHNL RV SENSING SENSITIVITY: 0.5 mV
Pulse Gen Serial Number: 1109991

## 2016-02-03 NOTE — Telephone Encounter (Signed)
See below

## 2016-02-03 NOTE — Telephone Encounter (Signed)
[facility] (10/13) and patient is needing the following PT, OT and Skill nursing.  Anna at [facility] would like to know if Dr. [name] would be willing to sign off on the orders.

## 2016-02-04 DIAGNOSIS — L97522 Non-pressure chronic ulcer of other part of left foot with fat layer exposed: Secondary | ICD-10-CM | POA: Diagnosis not present

## 2016-02-04 DIAGNOSIS — E1122 Type 2 diabetes mellitus with diabetic chronic kidney disease: Secondary | ICD-10-CM | POA: Diagnosis not present

## 2016-02-04 DIAGNOSIS — I130 Hypertensive heart and chronic kidney disease with heart failure and stage 1 through stage 4 chronic kidney disease, or unspecified chronic kidney disease: Secondary | ICD-10-CM | POA: Diagnosis not present

## 2016-02-04 DIAGNOSIS — S72001D Fracture of unspecified part of neck of right femur, subsequent encounter for closed fracture with routine healing: Secondary | ICD-10-CM | POA: Diagnosis not present

## 2016-02-04 DIAGNOSIS — E11621 Type 2 diabetes mellitus with foot ulcer: Secondary | ICD-10-CM | POA: Diagnosis not present

## 2016-02-04 NOTE — Telephone Encounter (Signed)
Verbal authorization can be given. I will sign orders for PT,OT, and skill nursing as recommended. Thanks, BJ

## 2016-02-06 ENCOUNTER — Telehealth: Payer: Self-pay | Admitting: Family Medicine

## 2016-02-06 DIAGNOSIS — S72001D Fracture of unspecified part of neck of right femur, subsequent encounter for closed fracture with routine healing: Secondary | ICD-10-CM | POA: Diagnosis not present

## 2016-02-06 DIAGNOSIS — E11621 Type 2 diabetes mellitus with foot ulcer: Secondary | ICD-10-CM | POA: Diagnosis not present

## 2016-02-06 DIAGNOSIS — I130 Hypertensive heart and chronic kidney disease with heart failure and stage 1 through stage 4 chronic kidney disease, or unspecified chronic kidney disease: Secondary | ICD-10-CM | POA: Diagnosis not present

## 2016-02-06 DIAGNOSIS — L97522 Non-pressure chronic ulcer of other part of left foot with fat layer exposed: Secondary | ICD-10-CM | POA: Diagnosis not present

## 2016-02-06 DIAGNOSIS — E1122 Type 2 diabetes mellitus with diabetic chronic kidney disease: Secondary | ICD-10-CM | POA: Diagnosis not present

## 2016-02-06 NOTE — Telephone Encounter (Signed)
See below

## 2016-02-06 NOTE — Telephone Encounter (Signed)
Physical therapist did eval on 10/14 per initial order from nursing home. Would like verbal to continue  2 wk/ 4 1 wk /1  For functional mobility training

## 2016-02-06 NOTE — Telephone Encounter (Signed)
Verbal given 

## 2016-02-06 NOTE — Telephone Encounter (Signed)
[name] [name] would like pt to have OT plan of care once a wk for 3 wks and twice a wk for 1 wk for training and adls, transfer and therapeutic exercise, fall preventation and health promotion and infection prevention and pain control. Don needs verbal order to discharge when goals are mets or at maximum potential.

## 2016-02-06 NOTE — Telephone Encounter (Signed)
Ok to given verbal authorization. Thanks, BJ

## 2016-02-06 NOTE — Telephone Encounter (Signed)
Ok to given verbal orders as recommended. Thanks, BJ

## 2016-02-07 DIAGNOSIS — I130 Hypertensive heart and chronic kidney disease with heart failure and stage 1 through stage 4 chronic kidney disease, or unspecified chronic kidney disease: Secondary | ICD-10-CM | POA: Diagnosis not present

## 2016-02-07 DIAGNOSIS — L97522 Non-pressure chronic ulcer of other part of left foot with fat layer exposed: Secondary | ICD-10-CM | POA: Diagnosis not present

## 2016-02-07 DIAGNOSIS — S72001D Fracture of unspecified part of neck of right femur, subsequent encounter for closed fracture with routine healing: Secondary | ICD-10-CM | POA: Diagnosis not present

## 2016-02-07 DIAGNOSIS — E11621 Type 2 diabetes mellitus with foot ulcer: Secondary | ICD-10-CM | POA: Diagnosis not present

## 2016-02-07 DIAGNOSIS — E1122 Type 2 diabetes mellitus with diabetic chronic kidney disease: Secondary | ICD-10-CM | POA: Diagnosis not present

## 2016-02-09 DIAGNOSIS — E1122 Type 2 diabetes mellitus with diabetic chronic kidney disease: Secondary | ICD-10-CM | POA: Diagnosis not present

## 2016-02-09 DIAGNOSIS — L97522 Non-pressure chronic ulcer of other part of left foot with fat layer exposed: Secondary | ICD-10-CM | POA: Diagnosis not present

## 2016-02-09 DIAGNOSIS — S72001D Fracture of unspecified part of neck of right femur, subsequent encounter for closed fracture with routine healing: Secondary | ICD-10-CM | POA: Diagnosis not present

## 2016-02-09 DIAGNOSIS — E11621 Type 2 diabetes mellitus with foot ulcer: Secondary | ICD-10-CM | POA: Diagnosis not present

## 2016-02-09 DIAGNOSIS — I130 Hypertensive heart and chronic kidney disease with heart failure and stage 1 through stage 4 chronic kidney disease, or unspecified chronic kidney disease: Secondary | ICD-10-CM | POA: Diagnosis not present

## 2016-02-10 DIAGNOSIS — S72001D Fracture of unspecified part of neck of right femur, subsequent encounter for closed fracture with routine healing: Secondary | ICD-10-CM | POA: Diagnosis not present

## 2016-02-10 DIAGNOSIS — E1122 Type 2 diabetes mellitus with diabetic chronic kidney disease: Secondary | ICD-10-CM | POA: Diagnosis not present

## 2016-02-10 DIAGNOSIS — I130 Hypertensive heart and chronic kidney disease with heart failure and stage 1 through stage 4 chronic kidney disease, or unspecified chronic kidney disease: Secondary | ICD-10-CM | POA: Diagnosis not present

## 2016-02-10 DIAGNOSIS — E11621 Type 2 diabetes mellitus with foot ulcer: Secondary | ICD-10-CM | POA: Diagnosis not present

## 2016-02-10 DIAGNOSIS — L97522 Non-pressure chronic ulcer of other part of left foot with fat layer exposed: Secondary | ICD-10-CM | POA: Diagnosis not present

## 2016-02-13 ENCOUNTER — Other Ambulatory Visit: Payer: Self-pay | Admitting: *Deleted

## 2016-02-13 DIAGNOSIS — E11621 Type 2 diabetes mellitus with foot ulcer: Secondary | ICD-10-CM | POA: Diagnosis not present

## 2016-02-13 DIAGNOSIS — E1122 Type 2 diabetes mellitus with diabetic chronic kidney disease: Secondary | ICD-10-CM | POA: Diagnosis not present

## 2016-02-13 DIAGNOSIS — S72001D Fracture of unspecified part of neck of right femur, subsequent encounter for closed fracture with routine healing: Secondary | ICD-10-CM | POA: Diagnosis not present

## 2016-02-13 DIAGNOSIS — L97522 Non-pressure chronic ulcer of other part of left foot with fat layer exposed: Secondary | ICD-10-CM | POA: Diagnosis not present

## 2016-02-13 DIAGNOSIS — I130 Hypertensive heart and chronic kidney disease with heart failure and stage 1 through stage 4 chronic kidney disease, or unspecified chronic kidney disease: Secondary | ICD-10-CM | POA: Diagnosis not present

## 2016-02-13 NOTE — Patient Outreach (Signed)
[facility]) Care Management  02/13/2016  [name] [name] [birth year] [id number]   CSW was able to make contact with patient today to ensure that patient had a smooth transition back home.  Patient was recently discharged from [facility] [place] where patient was residing to receive short-term rehabilitative services.  Patient was discharged on October 13th, and home health services were arranged for patient through [facility].  Patient will receive a home health nurse, physical therapist and occupational therapist.  Patient denies having any social work needs at present. CSW will perform a case closure on patient, as all goals of treatment have been met from social work standpoint and no additional social work needs have been identified at this time.  CSW will notify patient's RNCM with [facility], Tish Men of CSW's plans to close patient's case.  CSW will fax an update to patient's Primary Care Physician, Dr. [name] to ensure that they are aware of CSW's involvement with patient's plan of care.  CSW will submit a case closure request to Verlon Setting, Care Management Assistant with [facility], in the form of an [employer].  CSW will ensure that [name] is aware of Darlin Priestly, RNCM with [facility], continued involvement with patient's care. [name] [name], BSW, MSW, LCSW  Licensed Education officer, environmental Health System  Mailing [place] [address] [place], [place] [address] Physical Address-[address], [place], [place] [address] Toll Free Main # [phone] Fax # [phone] Cell # [phone]  Fax # [phone]  [email or web address]

## 2016-02-14 ENCOUNTER — Encounter (HOSPITAL_COMMUNITY): Payer: Commercial Managed Care - HMO

## 2016-02-14 DIAGNOSIS — S72001D Fracture of unspecified part of neck of right femur, subsequent encounter for closed fracture with routine healing: Secondary | ICD-10-CM | POA: Diagnosis not present

## 2016-02-14 DIAGNOSIS — L97522 Non-pressure chronic ulcer of other part of left foot with fat layer exposed: Secondary | ICD-10-CM | POA: Diagnosis not present

## 2016-02-14 DIAGNOSIS — E1122 Type 2 diabetes mellitus with diabetic chronic kidney disease: Secondary | ICD-10-CM | POA: Diagnosis not present

## 2016-02-14 DIAGNOSIS — E11621 Type 2 diabetes mellitus with foot ulcer: Secondary | ICD-10-CM | POA: Diagnosis not present

## 2016-02-14 DIAGNOSIS — I130 Hypertensive heart and chronic kidney disease with heart failure and stage 1 through stage 4 chronic kidney disease, or unspecified chronic kidney disease: Secondary | ICD-10-CM | POA: Diagnosis not present

## 2016-02-16 DIAGNOSIS — E11621 Type 2 diabetes mellitus with foot ulcer: Secondary | ICD-10-CM | POA: Diagnosis not present

## 2016-02-16 DIAGNOSIS — I130 Hypertensive heart and chronic kidney disease with heart failure and stage 1 through stage 4 chronic kidney disease, or unspecified chronic kidney disease: Secondary | ICD-10-CM | POA: Diagnosis not present

## 2016-02-16 DIAGNOSIS — E1122 Type 2 diabetes mellitus with diabetic chronic kidney disease: Secondary | ICD-10-CM | POA: Diagnosis not present

## 2016-02-16 DIAGNOSIS — S72001D Fracture of unspecified part of neck of right femur, subsequent encounter for closed fracture with routine healing: Secondary | ICD-10-CM | POA: Diagnosis not present

## 2016-02-16 DIAGNOSIS — L97522 Non-pressure chronic ulcer of other part of left foot with fat layer exposed: Secondary | ICD-10-CM | POA: Diagnosis not present

## 2016-02-17 ENCOUNTER — Ambulatory Visit: Payer: Commercial Managed Care - HMO | Admitting: Neurology

## 2016-02-17 ENCOUNTER — Other Ambulatory Visit: Payer: Self-pay

## 2016-02-17 DIAGNOSIS — E1122 Type 2 diabetes mellitus with diabetic chronic kidney disease: Secondary | ICD-10-CM | POA: Diagnosis not present

## 2016-02-17 DIAGNOSIS — L97522 Non-pressure chronic ulcer of other part of left foot with fat layer exposed: Secondary | ICD-10-CM | POA: Diagnosis not present

## 2016-02-17 DIAGNOSIS — E11621 Type 2 diabetes mellitus with foot ulcer: Secondary | ICD-10-CM | POA: Diagnosis not present

## 2016-02-17 DIAGNOSIS — S72001D Fracture of unspecified part of neck of right femur, subsequent encounter for closed fracture with routine healing: Secondary | ICD-10-CM | POA: Diagnosis not present

## 2016-02-17 DIAGNOSIS — I130 Hypertensive heart and chronic kidney disease with heart failure and stage 1 through stage 4 chronic kidney disease, or unspecified chronic kidney disease: Secondary | ICD-10-CM | POA: Diagnosis not present

## 2016-02-17 NOTE — Patient Outreach (Signed)
[facility]) Care Management  02/17/16  [name] [name] 1946/02/13 [id number]  Patient recently discharged to home from SNF.   Attempted to reach patient at home number [phone] without success. Phone rang multiple times with no answer and no option to leave a voicemail.  RNCM also attempted to reach patient at mobile number [phone]. Message stated this number was no longer in service.  [name], RN, BSN, [facility] Management Coordinator [phone]

## 2016-02-20 ENCOUNTER — Other Ambulatory Visit: Payer: Self-pay

## 2016-02-20 NOTE — Patient Outreach (Signed)
[name]  02/20/16  [name] [name] [birth year] [id number]  Second attempt to reach patient for transition of care without success. Phone rang multiple times with no answer and no option to leave a voicemail.  [name], RN, BSN, [facility] Management Coordinator [phone]

## 2016-02-21 DIAGNOSIS — E1122 Type 2 diabetes mellitus with diabetic chronic kidney disease: Secondary | ICD-10-CM | POA: Diagnosis not present

## 2016-02-21 DIAGNOSIS — S72001D Fracture of unspecified part of neck of right femur, subsequent encounter for closed fracture with routine healing: Secondary | ICD-10-CM | POA: Diagnosis not present

## 2016-02-21 DIAGNOSIS — L97522 Non-pressure chronic ulcer of other part of left foot with fat layer exposed: Secondary | ICD-10-CM | POA: Diagnosis not present

## 2016-02-21 DIAGNOSIS — E11621 Type 2 diabetes mellitus with foot ulcer: Secondary | ICD-10-CM | POA: Diagnosis not present

## 2016-02-21 DIAGNOSIS — I130 Hypertensive heart and chronic kidney disease with heart failure and stage 1 through stage 4 chronic kidney disease, or unspecified chronic kidney disease: Secondary | ICD-10-CM | POA: Diagnosis not present

## 2016-02-22 ENCOUNTER — Other Ambulatory Visit: Payer: Self-pay | Admitting: Family Medicine

## 2016-02-22 ENCOUNTER — Other Ambulatory Visit: Payer: Self-pay

## 2016-02-22 DIAGNOSIS — E1122 Type 2 diabetes mellitus with diabetic chronic kidney disease: Secondary | ICD-10-CM | POA: Diagnosis not present

## 2016-02-22 DIAGNOSIS — I130 Hypertensive heart and chronic kidney disease with heart failure and stage 1 through stage 4 chronic kidney disease, or unspecified chronic kidney disease: Secondary | ICD-10-CM | POA: Diagnosis not present

## 2016-02-22 DIAGNOSIS — L97522 Non-pressure chronic ulcer of other part of left foot with fat layer exposed: Secondary | ICD-10-CM | POA: Diagnosis not present

## 2016-02-22 DIAGNOSIS — E11621 Type 2 diabetes mellitus with foot ulcer: Secondary | ICD-10-CM | POA: Diagnosis not present

## 2016-02-22 DIAGNOSIS — S72001D Fracture of unspecified part of neck of right femur, subsequent encounter for closed fracture with routine healing: Secondary | ICD-10-CM | POA: Diagnosis not present

## 2016-02-22 NOTE — Patient Outreach (Signed)
[facility]) Care Management  02/22/16  [name] [name] 1945-07-25 [id number]  Attempted to reach patient without success. Phone rang multiple times with no answer and no option to leave a voicemail.  [place] R. Graelyn Bihl, RN, BSN, [facility] Management Coordinator [phone]

## 2016-02-24 DIAGNOSIS — E1122 Type 2 diabetes mellitus with diabetic chronic kidney disease: Secondary | ICD-10-CM | POA: Diagnosis not present

## 2016-02-24 DIAGNOSIS — S72001D Fracture of unspecified part of neck of right femur, subsequent encounter for closed fracture with routine healing: Secondary | ICD-10-CM | POA: Diagnosis not present

## 2016-02-24 DIAGNOSIS — L97522 Non-pressure chronic ulcer of other part of left foot with fat layer exposed: Secondary | ICD-10-CM | POA: Diagnosis not present

## 2016-02-24 DIAGNOSIS — E11621 Type 2 diabetes mellitus with foot ulcer: Secondary | ICD-10-CM | POA: Diagnosis not present

## 2016-02-24 DIAGNOSIS — I130 Hypertensive heart and chronic kidney disease with heart failure and stage 1 through stage 4 chronic kidney disease, or unspecified chronic kidney disease: Secondary | ICD-10-CM | POA: Diagnosis not present

## 2016-02-27 DIAGNOSIS — E11621 Type 2 diabetes mellitus with foot ulcer: Secondary | ICD-10-CM | POA: Diagnosis not present

## 2016-02-27 DIAGNOSIS — I130 Hypertensive heart and chronic kidney disease with heart failure and stage 1 through stage 4 chronic kidney disease, or unspecified chronic kidney disease: Secondary | ICD-10-CM | POA: Diagnosis not present

## 2016-02-27 DIAGNOSIS — S72001D Fracture of unspecified part of neck of right femur, subsequent encounter for closed fracture with routine healing: Secondary | ICD-10-CM | POA: Diagnosis not present

## 2016-02-27 DIAGNOSIS — E1122 Type 2 diabetes mellitus with diabetic chronic kidney disease: Secondary | ICD-10-CM | POA: Diagnosis not present

## 2016-02-27 DIAGNOSIS — L97522 Non-pressure chronic ulcer of other part of left foot with fat layer exposed: Secondary | ICD-10-CM | POA: Diagnosis not present

## 2016-02-28 DIAGNOSIS — I130 Hypertensive heart and chronic kidney disease with heart failure and stage 1 through stage 4 chronic kidney disease, or unspecified chronic kidney disease: Secondary | ICD-10-CM | POA: Diagnosis not present

## 2016-02-28 DIAGNOSIS — E1122 Type 2 diabetes mellitus with diabetic chronic kidney disease: Secondary | ICD-10-CM | POA: Diagnosis not present

## 2016-02-28 DIAGNOSIS — L97522 Non-pressure chronic ulcer of other part of left foot with fat layer exposed: Secondary | ICD-10-CM | POA: Diagnosis not present

## 2016-02-28 DIAGNOSIS — E11621 Type 2 diabetes mellitus with foot ulcer: Secondary | ICD-10-CM | POA: Diagnosis not present

## 2016-02-28 DIAGNOSIS — S72001D Fracture of unspecified part of neck of right femur, subsequent encounter for closed fracture with routine healing: Secondary | ICD-10-CM | POA: Diagnosis not present

## 2016-02-29 ENCOUNTER — Encounter: Payer: Self-pay | Admitting: Neurology

## 2016-02-29 ENCOUNTER — Telehealth: Payer: Self-pay | Admitting: Neurology

## 2016-02-29 ENCOUNTER — Ambulatory Visit (INDEPENDENT_AMBULATORY_CARE_PROVIDER_SITE_OTHER): Payer: Commercial Managed Care - HMO | Admitting: Neurology

## 2016-02-29 VITALS — BP 100/68 | HR 52 | Resp 12 | Ht 74.0 in

## 2016-02-29 DIAGNOSIS — R32 Unspecified urinary incontinence: Secondary | ICD-10-CM

## 2016-02-29 DIAGNOSIS — R296 Repeated falls: Secondary | ICD-10-CM | POA: Diagnosis not present

## 2016-02-29 DIAGNOSIS — M21372 Foot drop, left foot: Secondary | ICD-10-CM

## 2016-02-29 DIAGNOSIS — E46 Unspecified protein-calorie malnutrition: Secondary | ICD-10-CM

## 2016-02-29 DIAGNOSIS — R159 Full incontinence of feces: Secondary | ICD-10-CM

## 2016-02-29 DIAGNOSIS — G8311 Monoplegia of lower limb affecting right dominant side: Secondary | ICD-10-CM | POA: Diagnosis not present

## 2016-02-29 NOTE — Progress Notes (Signed)
Follow-up Visit   Date: 02/29/16    [name] [name] MRN: [id number] DOB: 07-03-1945   Interim History: [name] [name] is a 70 y.o. right-handed Caucasian male with diabetes mellitus ([id number] 12) complicated by neuropathy and renal insufficiency, stroke (2015), CAD s/p MI (2007), NSVT s/p ICD, heart failure (EF 15%) hypertension returning to the clinic for follow-up of new right leg weakness.  The patient was accompanied to the clinic by wife and friend who also provides collateral information.    History of present illness: Starting around June 2017, he began having right arm shaking, lasting <1 minute, but occurring every 20-minutes.  The movements were involuntary and he is unable to controled them. These movements resolved within a month, while he was hospitalized, and has not returned.  In early July 2017, patient was hospitalized for acute mental status change, right arm tremor, and aspiration pneumonia.  In the ER, he went into VT and AICD shocked him three times.  He was intubated and treated with pneumonia as well as NSTEMI.  Cardiac catherization multivessel disease. There was concern of possible new stroke because of slurred speech and right sided weakness, however this resolved within a week.  CT head was performed on 7/6 when he was admitted which showed chronic changes.  He has not had subsquent imaging. He was discharged to rehab on 11/06/2015 where lives currently.  He had stroke (2015) which left him with mild dysphagia and right leg weakness, but he was walking independently.    He also complains of low back pain especially with prolonged standing or walking.  He has constant numbness of the legs and gait imbalance.  He has new left foot drop which has been present since his hospitalization.  He admits to crossing his legs often, especially now that the swelling of his legs is significantly improved.   UPDATE 02/29/2016: Since he was last here, he suffered a fall while  at home and had a right femoral neck fracture and underwent right hemiarthroplasty on 8/27 by Dr. Ronnie Derby.  He was discharged to rehab and was doing well, but while sleeping, he fell out of bedtime and since this time has been unable to walk or stand.  He has severe weakness of the right leg and inability to control his bowel and bladder.  He has been home for the past two weeks with home nursing care and PT.  Since his fall, he has been unable to walk and his right leg feels like "dead weight".  There is no increased numbness/tingling since the fall - this still involves both legs to the level of the knees.  He denies radicular pain or low back pain. He no longer has any abnormal movements of the right hand.   Medications:  Current Outpatient Prescriptions on File Prior to Visit  Medication Sig Dispense Refill  . amiodarone (PACERONE) 100 MG tablet Take 200 mg by mouth daily.    Marland Kitchen aspirin EC 81 MG tablet Take 81 mg by mouth daily.    . carvedilol (COREG) 3.125 MG tablet Take 1.5625 mg by mouth 2 (two) times daily with a meal.    . clopidogrel (PLAVIX) 75 MG tablet Take 1 tablet (75 mg total) by mouth daily. 30 tablet 0  . dextromethorphan-guaiFENesin (MUCINEX DM) 30-600 MG 12hr tablet Take 1 tablet by mouth daily. 30 tablet 0  . digoxin (LANOXIN) 0.125 MG tablet Take 0.125 mg by mouth daily.    Marland Kitchen HYDROcodone-acetaminophen (NORCO/VICODIN) 5-325 MG tablet Take  1-2 tablets by mouth every 6 (six) hours as needed for moderate pain. 30 tablet 0  . insulin aspart (NOVOLOG FLEXPEN) 100 UNIT/ML FlexPen Inject 5-15 Units into the skin 3 (three) times daily with meals.    . insulin detemir (LEVEMIR) 100 UNIT/ML injection Inject 20 Units into the skin at bedtime.    . mirtazapine (REMERON) 15 MG tablet TAKE 1 TABLET(15 MG) BY MOUTH AT BEDTIME 30 tablet 2  . multivitamin-iron-minerals-folic acid (THERAPEUTIC-M) TABS tablet Take 1 tablet by mouth daily.    . rosuvastatin (CRESTOR) 40 MG tablet Take 40 mg by mouth  daily.    Marland Kitchen spironolactone (ALDACTONE) 25 MG tablet Take 0.5 tablets (12.5 mg total) by mouth daily. 15 tablet 6  . tamsulosin (FLOMAX) 0.4 MG CAPS capsule Take 1 capsule (0.4 mg total) by mouth daily. 30 capsule 6  . torsemide (DEMADEX) 20 MG tablet Take 1 tablet (20 mg total) by mouth daily. 60 tablet 6  . vitamin A 10000 UNIT capsule Take 10,000 Units by mouth daily.    Marland Kitchen zinc sulfate 220 (50 Zn) MG capsule Take 220 mg by mouth daily.     No current facility-administered medications on file prior to visit.     Allergies:  Allergies  Allergen Reactions  . Sulfa Antibiotics Rash    Review of Systems:  CONSTITUTIONAL: No fevers, chills, night sweats, or weight loss.  EYES: No visual changes or eye pain ENT: No hearing changes.  No history of nose bleeds.   RESPIRATORY: No cough, wheezing and shortness of breath.   CARDIOVASCULAR: Negative for chest pain, and palpitations.   GI: Negative for abdominal discomfort, blood in stools or black stools.  No recent change in bowel habits.   GU:  No history of incontinence.   MUSCLOSKELETAL: No history of joint pain or swelling.  No myalgias.   SKIN: Negative for lesions, rash, and itching.   ENDOCRINE: Negative for cold or heat intolerance, polydipsia or goiter.   PSYCH:  No depression or anxiety symptoms.   NEURO: As Above.   Vital Signs:  BP 100/68   Pulse (!) 52   Resp 12   Ht 6\' 2"  (1.88 m)   SpO2 98%      General: General:  Thin-appearing, comfortable. Arrived in wheelchair  Neurological Exam: MENTAL STATUS including orientation to time, place, person, recent and remote memory, attention span and concentration, language, and fund of knowledge is normal.  Speech is not dysarthric.  CRANIAL NERVES:  Pupils equal round and reactive to light.  Normal conjugate, extra-ocular eye movements in all directions of gaze.  No ptosis. Normal facial sensation.  Face is symmetric. Palate elevates symmetrically.  Tongue is midline.  MOTOR:   Marked muscle wasting throughout, especially in the legs.  No fasciculations or abnormal movements.  No pronator drift.  Tone is normal.    Right Upper Extremity:    Left Upper Extremity:    Deltoid  5/5   Deltoid  5/5   Biceps  5/5   Biceps  5/5   Triceps  5/5   Triceps  5/5   Wrist extensors  5/5   Wrist extensors  5/5   Wrist flexors  5/5   Wrist flexors  5/5   Finger extensors  5/5   Finger extensors  5/5   Finger flexors  5/5   Finger flexors  5/5   Dorsal interossei  5/5   Dorsal interossei  5/5   Abductor pollicis  5/5   Abductor pollicis  5/5   Tone (Ashworth scale)  0  Tone (Ashworth scale)  0   Right Lower Extremity:    Left Lower Extremity:    Hip flexors  3/5   Hip flexors  5/5   Hip extensors  3/5   Hip extensors  5/5   Knee flexors  3/5   Knee flexors  5/5   Knee extensors  3/5   Knee extensors  5/5   Dorsiflexors  3/5   Dorsiflexors  2/5   Plantarflexors  3/5   Plantarflexors  5/5   Eversion 3/5  Eversion 2/5  Inversion 3/5  Inversion 4/5  Toe extensors  3/5   Toe extensors  2/5   Toe flexors  3/5   Toe flexors  4/5   Tone (Ashworth scale)  0  Tone (Ashworth scale)  0   MSRs:  Reflexes are 2+/4 throughout, except absent at the Achilles bilaterally.  SENSORY: Gradient pattern of sensory loss distal to knees bilaterally to pin prick, temperature, and vibration.    COORDINATION/GAIT:  Gait is not tested as patient is wheelchair bound  Data: CT head wo contrast 10/27/2015:  Atrophy with chronic microvascular ischemia. No acute abnormality.  CT head wo contrast 12/16/2015:  1. No acute intracranial findings. 2. Atrophy and chronic white matter microvascular disease.  Lab Results  Component Value Date   HGBA1C 12.0 (H) 10/27/2015   Lab Results  Component Value Date   TSH 1.785 07/26/2015    IMPRESSION/PLAN: 1.  Right leg weakness with urine and bowel incontinence s/p fall - new.  CT thoracic and lumbar spine without contrast to  look for structural pathology/spinal cord compression as he is unable to get MRI due to AICD.  He is currently wheelchair bound.    2.  Left foot drop - peroneal mononeuropathy at the knee given significant loss of subcutaneous tissue initially suspected, but now he has weakness with inversion and right leg weakness. Need to evaluate for lumbosacral radiculopathy.  NCS/EMG of the legs will also be ordered.  Continue physical therapy.  3.  Protein calorie malnutrition.  Marked muscle atrophy of the legs with generalized loss of muscle bulk.  Encouraged to add ensure to diet  4.  Transient right sided weakness and slurred speech while hospitalized most likely represents post-stroke recrudescence in the setting of an acute illness. CT head is negative for acute stroke.  He has not had further spells. - Resolved.  5.  Right arm tremor - resolved.  6.  Diabetic polyneuropathy affecting the legs causing numbness and sensory ataxia.   Further recommendations will be based on the results of his testing  The duration of this appointment visit was 40 minutes of face-to-face time with the patient.  Greater than 50% of this time was spent in counseling, explanation of diagnosis, planning of further management, and coordination of care.   Thank you for allowing me to participate in patient's care.  If I can answer any additional questions, I would be pleased to do so.    Sincerely,    Donika K. Posey Pronto, DO

## 2016-02-29 NOTE — Telephone Encounter (Signed)
Patient daughter states that she thought the patient was to have the imaging done before the EMG please call her at [phone]

## 2016-02-29 NOTE — Patient Instructions (Addendum)
1.  CT thoracic and lumbar spine without contrast 2.  NCS/EMG of the legs on November 21st at 12:30pm.  Please arrive 15 minutes prior to appointment.  3.  Encouraged to add ensure to diet  We will call you with the results

## 2016-03-01 ENCOUNTER — Telehealth: Payer: Self-pay | Admitting: Family Medicine

## 2016-03-01 DIAGNOSIS — S72001D Fracture of unspecified part of neck of right femur, subsequent encounter for closed fracture with routine healing: Secondary | ICD-10-CM | POA: Diagnosis not present

## 2016-03-01 DIAGNOSIS — I130 Hypertensive heart and chronic kidney disease with heart failure and stage 1 through stage 4 chronic kidney disease, or unspecified chronic kidney disease: Secondary | ICD-10-CM | POA: Diagnosis not present

## 2016-03-01 DIAGNOSIS — E1122 Type 2 diabetes mellitus with diabetic chronic kidney disease: Secondary | ICD-10-CM | POA: Diagnosis not present

## 2016-03-01 DIAGNOSIS — E11621 Type 2 diabetes mellitus with foot ulcer: Secondary | ICD-10-CM | POA: Diagnosis not present

## 2016-03-01 DIAGNOSIS — L97522 Non-pressure chronic ulcer of other part of left foot with fat layer exposed: Secondary | ICD-10-CM | POA: Diagnosis not present

## 2016-03-01 NOTE — Telephone Encounter (Signed)
Left message for patient's daughter to call me back.  CT has been moved to Nov. 16 at 2:00.  Arrival time is 1:40.

## 2016-03-01 NOTE — Telephone Encounter (Signed)
Yes, CT thoracic and lumbar spine first; then EMG.  We can move the appointment for his EMG, if CT cannot be done before 11/21.

## 2016-03-01 NOTE — Telephone Encounter (Signed)
Does one need to be done before the other?

## 2016-03-01 NOTE — Telephone Encounter (Signed)
[facility] faxed to [facility].

## 2016-03-01 NOTE — Telephone Encounter (Signed)
CTs moved again per [facility] request.  EMG date also changed.  [name] [name] is aware of all changes.

## 2016-03-01 NOTE — Telephone Encounter (Signed)
It is Ok to order. Thanks, BJ

## 2016-03-01 NOTE — Telephone Encounter (Signed)
Urvi  needs order fax to [facility] for pt to have  adjustable hand rail bedside commode

## 2016-03-01 NOTE — Telephone Encounter (Signed)
Order okay?

## 2016-03-02 ENCOUNTER — Telehealth: Payer: Self-pay | Admitting: Family Medicine

## 2016-03-02 DIAGNOSIS — E11621 Type 2 diabetes mellitus with foot ulcer: Secondary | ICD-10-CM | POA: Diagnosis not present

## 2016-03-02 DIAGNOSIS — Z4789 Encounter for other orthopedic aftercare: Secondary | ICD-10-CM | POA: Diagnosis not present

## 2016-03-02 DIAGNOSIS — M6281 Muscle weakness (generalized): Secondary | ICD-10-CM | POA: Diagnosis not present

## 2016-03-02 DIAGNOSIS — R278 Other lack of coordination: Secondary | ICD-10-CM | POA: Diagnosis not present

## 2016-03-02 DIAGNOSIS — E1122 Type 2 diabetes mellitus with diabetic chronic kidney disease: Secondary | ICD-10-CM | POA: Diagnosis not present

## 2016-03-02 DIAGNOSIS — I5022 Chronic systolic (congestive) heart failure: Secondary | ICD-10-CM | POA: Diagnosis not present

## 2016-03-02 DIAGNOSIS — S72001D Fracture of unspecified part of neck of right femur, subsequent encounter for closed fracture with routine healing: Secondary | ICD-10-CM | POA: Diagnosis not present

## 2016-03-02 DIAGNOSIS — I2510 Atherosclerotic heart disease of native coronary artery without angina pectoris: Secondary | ICD-10-CM | POA: Diagnosis not present

## 2016-03-02 DIAGNOSIS — R262 Difficulty in walking, not elsewhere classified: Secondary | ICD-10-CM | POA: Diagnosis not present

## 2016-03-02 DIAGNOSIS — L97522 Non-pressure chronic ulcer of other part of left foot with fat layer exposed: Secondary | ICD-10-CM | POA: Diagnosis not present

## 2016-03-02 DIAGNOSIS — I130 Hypertensive heart and chronic kidney disease with heart failure and stage 1 through stage 4 chronic kidney disease, or unspecified chronic kidney disease: Secondary | ICD-10-CM | POA: Diagnosis not present

## 2016-03-02 NOTE — Telephone Encounter (Signed)
It is Ok to given verbal orders. Thanks, BJ

## 2016-03-02 NOTE — Telephone Encounter (Signed)
See below

## 2016-03-02 NOTE — Telephone Encounter (Signed)
Timmothy Sours, OT would like to extend 1 week OT due to the patient will be receiving a drop down arm rest commode extender and need training on it.  Expect D/C after next week.  Can call and leave verbal orders on machine.

## 2016-03-05 DIAGNOSIS — R262 Difficulty in walking, not elsewhere classified: Secondary | ICD-10-CM | POA: Diagnosis not present

## 2016-03-05 DIAGNOSIS — E1122 Type 2 diabetes mellitus with diabetic chronic kidney disease: Secondary | ICD-10-CM | POA: Diagnosis not present

## 2016-03-05 DIAGNOSIS — E11621 Type 2 diabetes mellitus with foot ulcer: Secondary | ICD-10-CM | POA: Diagnosis not present

## 2016-03-05 DIAGNOSIS — R278 Other lack of coordination: Secondary | ICD-10-CM | POA: Diagnosis not present

## 2016-03-05 DIAGNOSIS — I130 Hypertensive heart and chronic kidney disease with heart failure and stage 1 through stage 4 chronic kidney disease, or unspecified chronic kidney disease: Secondary | ICD-10-CM | POA: Diagnosis not present

## 2016-03-05 DIAGNOSIS — S72001D Fracture of unspecified part of neck of right femur, subsequent encounter for closed fracture with routine healing: Secondary | ICD-10-CM | POA: Diagnosis not present

## 2016-03-05 DIAGNOSIS — I5022 Chronic systolic (congestive) heart failure: Secondary | ICD-10-CM | POA: Diagnosis not present

## 2016-03-05 DIAGNOSIS — Z4789 Encounter for other orthopedic aftercare: Secondary | ICD-10-CM | POA: Diagnosis not present

## 2016-03-05 DIAGNOSIS — L97522 Non-pressure chronic ulcer of other part of left foot with fat layer exposed: Secondary | ICD-10-CM | POA: Diagnosis not present

## 2016-03-05 DIAGNOSIS — M6281 Muscle weakness (generalized): Secondary | ICD-10-CM | POA: Diagnosis not present

## 2016-03-05 DIAGNOSIS — I2510 Atherosclerotic heart disease of native coronary artery without angina pectoris: Secondary | ICD-10-CM | POA: Diagnosis not present

## 2016-03-05 NOTE — Telephone Encounter (Signed)
Verbal given 

## 2016-03-06 ENCOUNTER — Other Ambulatory Visit: Payer: Self-pay

## 2016-03-06 ENCOUNTER — Ambulatory Visit (HOSPITAL_BASED_OUTPATIENT_CLINIC_OR_DEPARTMENT_OTHER)
Admission: RE | Admit: 2016-03-06 | Discharge: 2016-03-06 | Disposition: A | Discharge status: Home / Self Care | Payer: Commercial Managed Care - HMO | Source: Ambulatory Visit | Attending: Internal Medicine | Admitting: Internal Medicine

## 2016-03-06 ENCOUNTER — Encounter (HOSPITAL_COMMUNITY): Payer: Self-pay | Admitting: Internal Medicine

## 2016-03-06 ENCOUNTER — Observation Stay (HOSPITAL_COMMUNITY)
Admission: EM | Admit: 2016-03-06 | Discharge: 2016-03-10 | Disposition: A | Discharge status: Home / Self Care | Payer: Commercial Managed Care - HMO | Attending: Internal Medicine | Admitting: Internal Medicine

## 2016-03-06 ENCOUNTER — Telehealth (HOSPITAL_COMMUNITY): Payer: Self-pay | Admitting: *Deleted

## 2016-03-06 VITALS — BP 96/62 | HR 86

## 2016-03-06 DIAGNOSIS — N320 Bladder-neck obstruction: Secondary | ICD-10-CM | POA: Diagnosis not present

## 2016-03-06 DIAGNOSIS — I2510 Atherosclerotic heart disease of native coronary artery without angina pectoris: Secondary | ICD-10-CM | POA: Insufficient documentation

## 2016-03-06 DIAGNOSIS — Z8261 Family history of arthritis: Secondary | ICD-10-CM | POA: Insufficient documentation

## 2016-03-06 DIAGNOSIS — N390 Urinary tract infection, site not specified: Secondary | ICD-10-CM | POA: Insufficient documentation

## 2016-03-06 DIAGNOSIS — I252 Old myocardial infarction: Secondary | ICD-10-CM | POA: Insufficient documentation

## 2016-03-06 DIAGNOSIS — E43 Unspecified severe protein-calorie malnutrition: Secondary | ICD-10-CM | POA: Diagnosis not present

## 2016-03-06 DIAGNOSIS — I5043 Acute on chronic combined systolic (congestive) and diastolic (congestive) heart failure: Secondary | ICD-10-CM | POA: Diagnosis not present

## 2016-03-06 DIAGNOSIS — R159 Full incontinence of feces: Secondary | ICD-10-CM | POA: Insufficient documentation

## 2016-03-06 DIAGNOSIS — Z794 Long term (current) use of insulin: Secondary | ICD-10-CM | POA: Insufficient documentation

## 2016-03-06 DIAGNOSIS — I4892 Unspecified atrial flutter: Secondary | ICD-10-CM | POA: Diagnosis not present

## 2016-03-06 DIAGNOSIS — N189 Chronic kidney disease, unspecified: Secondary | ICD-10-CM

## 2016-03-06 DIAGNOSIS — E1142 Type 2 diabetes mellitus with diabetic polyneuropathy: Secondary | ICD-10-CM | POA: Insufficient documentation

## 2016-03-06 DIAGNOSIS — Z681 Body mass index (BMI) 19 or less, adult: Secondary | ICD-10-CM | POA: Insufficient documentation

## 2016-03-06 DIAGNOSIS — R338 Other retention of urine: Secondary | ICD-10-CM | POA: Diagnosis not present

## 2016-03-06 DIAGNOSIS — N319 Neuromuscular dysfunction of bladder, unspecified: Secondary | ICD-10-CM | POA: Diagnosis not present

## 2016-03-06 DIAGNOSIS — N179 Acute kidney failure, unspecified: Secondary | ICD-10-CM | POA: Diagnosis present

## 2016-03-06 DIAGNOSIS — I130 Hypertensive heart and chronic kidney disease with heart failure and stage 1 through stage 4 chronic kidney disease, or unspecified chronic kidney disease: Secondary | ICD-10-CM | POA: Insufficient documentation

## 2016-03-06 DIAGNOSIS — I255 Ischemic cardiomyopathy: Secondary | ICD-10-CM | POA: Diagnosis not present

## 2016-03-06 DIAGNOSIS — I472 Ventricular tachycardia: Secondary | ICD-10-CM | POA: Diagnosis not present

## 2016-03-06 DIAGNOSIS — I5022 Chronic systolic (congestive) heart failure: Secondary | ICD-10-CM

## 2016-03-06 DIAGNOSIS — L97529 Non-pressure chronic ulcer of other part of left foot with unspecified severity: Secondary | ICD-10-CM | POA: Diagnosis not present

## 2016-03-06 DIAGNOSIS — E875 Hyperkalemia: Secondary | ICD-10-CM | POA: Diagnosis not present

## 2016-03-06 DIAGNOSIS — N401 Enlarged prostate with lower urinary tract symptoms: Secondary | ICD-10-CM | POA: Insufficient documentation

## 2016-03-06 DIAGNOSIS — I5042 Chronic combined systolic (congestive) and diastolic (congestive) heart failure: Secondary | ICD-10-CM | POA: Diagnosis not present

## 2016-03-06 DIAGNOSIS — E1122 Type 2 diabetes mellitus with diabetic chronic kidney disease: Secondary | ICD-10-CM | POA: Insufficient documentation

## 2016-03-06 DIAGNOSIS — N312 Flaccid neuropathic bladder, not elsewhere classified: Secondary | ICD-10-CM | POA: Diagnosis not present

## 2016-03-06 DIAGNOSIS — G4733 Obstructive sleep apnea (adult) (pediatric): Secondary | ICD-10-CM | POA: Insufficient documentation

## 2016-03-06 DIAGNOSIS — Z825 Family history of asthma and other chronic lower respiratory diseases: Secondary | ICD-10-CM | POA: Insufficient documentation

## 2016-03-06 DIAGNOSIS — Z7982 Long term (current) use of aspirin: Secondary | ICD-10-CM | POA: Insufficient documentation

## 2016-03-06 DIAGNOSIS — Z7902 Long term (current) use of antithrombotics/antiplatelets: Secondary | ICD-10-CM | POA: Insufficient documentation

## 2016-03-06 DIAGNOSIS — N183 Chronic kidney disease, stage 3 unspecified: Secondary | ICD-10-CM | POA: Diagnosis present

## 2016-03-06 DIAGNOSIS — N184 Chronic kidney disease, stage 4 (severe): Secondary | ICD-10-CM | POA: Insufficient documentation

## 2016-03-06 DIAGNOSIS — M109 Gout, unspecified: Secondary | ICD-10-CM | POA: Diagnosis not present

## 2016-03-06 DIAGNOSIS — Z8673 Personal history of transient ischemic attack (TIA), and cerebral infarction without residual deficits: Secondary | ICD-10-CM | POA: Insufficient documentation

## 2016-03-06 DIAGNOSIS — Z882 Allergy status to sulfonamides status: Secondary | ICD-10-CM | POA: Insufficient documentation

## 2016-03-06 DIAGNOSIS — Z96641 Presence of right artificial hip joint: Secondary | ICD-10-CM | POA: Insufficient documentation

## 2016-03-06 DIAGNOSIS — N1330 Unspecified hydronephrosis: Secondary | ICD-10-CM | POA: Diagnosis not present

## 2016-03-06 DIAGNOSIS — E11621 Type 2 diabetes mellitus with foot ulcer: Secondary | ICD-10-CM | POA: Insufficient documentation

## 2016-03-06 DIAGNOSIS — I25810 Atherosclerosis of coronary artery bypass graft(s) without angina pectoris: Secondary | ICD-10-CM

## 2016-03-06 DIAGNOSIS — Z951 Presence of aortocoronary bypass graft: Secondary | ICD-10-CM | POA: Insufficient documentation

## 2016-03-06 DIAGNOSIS — Z95810 Presence of automatic (implantable) cardiac defibrillator: Secondary | ICD-10-CM | POA: Insufficient documentation

## 2016-03-06 DIAGNOSIS — Z955 Presence of coronary angioplasty implant and graft: Secondary | ICD-10-CM | POA: Insufficient documentation

## 2016-03-06 LAB — CBC
HCT: 36.7 % — ABNORMAL LOW (ref 39.0–52.0)
Hemoglobin: 11.3 g/dL — ABNORMAL LOW (ref 13.0–17.0)
MCH: 26 pg (ref 26.0–34.0)
MCHC: 30.8 g/dL (ref 30.0–36.0)
MCV: 84.6 fL (ref 78.0–100.0)
PLATELETS: 347 10*3/uL (ref 150–400)
RBC: 4.34 MIL/uL (ref 4.22–5.81)
RDW: 16.1 % — AB (ref 11.5–15.5)
WBC: 13.3 10*3/uL — AB (ref 4.0–10.5)

## 2016-03-06 LAB — COMPREHENSIVE METABOLIC PANEL
ALBUMIN: 2.6 g/dL — AB (ref 3.5–5.0)
ALK PHOS: 171 U/L — AB (ref 38–126)
ALT: 31 U/L (ref 17–63)
ALT: 33 U/L (ref 17–63)
ANION GAP: 9 (ref 5–15)
ANION GAP: 7 (ref 5–15)
AST: 26 U/L (ref 15–41)
AST: 26 U/L (ref 15–41)
Albumin: 2.4 g/dL — ABNORMAL LOW (ref 3.5–5.0)
Alkaline Phosphatase: 170 U/L — ABNORMAL HIGH (ref 38–126)
BILIRUBIN TOTAL: 0.3 mg/dL (ref 0.3–1.2)
BUN: 55 mg/dL — ABNORMAL HIGH (ref 6–20)
BUN: 55 mg/dL — AB (ref 6–20)
CALCIUM: 8.9 mg/dL (ref 8.9–10.3)
CHLORIDE: 104 mmol/L (ref 101–111)
CO2: 25 mmol/L (ref 22–32)
CO2: 26 mmol/L (ref 22–32)
Calcium: 9.1 mg/dL (ref 8.9–10.3)
Chloride: 103 mmol/L (ref 101–111)
Creatinine, Ser: 2.62 mg/dL — ABNORMAL HIGH (ref 0.61–1.24)
Creatinine, Ser: 2.48 mg/dL — ABNORMAL HIGH (ref 0.61–1.24)
GFR calc Af Amer: 29 mL/min — ABNORMAL LOW (ref >60)
GFR, EST AFRICAN AMERICAN: 27 mL/min — AB (ref >60)
GFR, EST NON AFRICAN AMERICAN: 23 mL/min — AB (ref >60)
GFR, EST NON AFRICAN AMERICAN: 25 mL/min — AB (ref >60)
GLUCOSE: 281 mg/dL — AB (ref 65–99)
Glucose, Bld: 395 mg/dL — ABNORMAL HIGH (ref 65–99)
POTASSIUM: 6.5 mmol/L — AB (ref 3.5–5.1)
Potassium: 6.6 mmol/L (ref 3.5–5.1)
Sodium: 137 mmol/L (ref 135–145)
Sodium: 137 mmol/L (ref 135–145)
TOTAL PROTEIN: 6.9 g/dL (ref 6.5–8.1)
Total Bilirubin: 0.5 mg/dL (ref 0.3–1.2)
Total Protein: 7.4 g/dL (ref 6.5–8.1)

## 2016-03-06 LAB — URINALYSIS, ROUTINE W REFLEX MICROSCOPIC
Bilirubin Urine: NEGATIVE
Glucose, UA: NEGATIVE mg/dL
Ketones, ur: NEGATIVE mg/dL
Nitrite: NEGATIVE
PROTEIN: 100 mg/dL — AB
SPECIFIC GRAVITY, URINE: 1.013 (ref 1.005–1.030)
pH: 5.5 (ref 5.0–8.0)

## 2016-03-06 LAB — URINE MICROSCOPIC-ADD ON

## 2016-03-06 LAB — TSH: TSH: 2.345 u[IU]/mL (ref 0.350–4.500)

## 2016-03-06 LAB — GLUCOSE, CAPILLARY: Glucose-Capillary: 201 mg/dL — ABNORMAL HIGH (ref 65–99)

## 2016-03-06 LAB — DIGOXIN LEVEL: DIGOXIN LVL: 1.9 ng/mL (ref 0.8–2.0)

## 2016-03-06 MED ORDER — SODIUM CHLORIDE 0.9% FLUSH
3.0000 mL | Freq: Two times a day (BID) | INTRAVENOUS | Status: DC
Start: 2016-03-07 — End: 2016-03-10
  Administered 2016-03-07 – 2016-03-10 (×7): 3 mL via INTRAVENOUS

## 2016-03-06 MED ORDER — INSULIN GLARGINE 100 UNIT/ML ~~LOC~~ SOLN
20.0000 [IU] | Freq: Every day | SUBCUTANEOUS | Status: DC
  Administered 2016-03-07 – 2016-03-09 (×4): 20 [IU] via SUBCUTANEOUS
  Filled 2016-03-06 (×6): qty 0.2

## 2016-03-06 MED ORDER — HYDROCODONE-ACETAMINOPHEN 5-325 MG PO TABS: 1.0000 | ORAL_TABLET | Freq: Four times a day (QID) | ORAL | Status: DC | PRN

## 2016-03-06 MED ORDER — FINASTERIDE 5 MG PO TABS
5.0000 mg | ORAL_TABLET | Freq: Every day | ORAL | Status: DC
  Administered 2016-03-07 – 2016-03-10 (×4): 5 mg via ORAL
  Filled 2016-03-06 (×4): qty 1

## 2016-03-06 MED ORDER — CLOPIDOGREL BISULFATE 75 MG PO TABS
75.0000 mg | ORAL_TABLET | Freq: Every day | ORAL | Status: DC
  Administered 2016-03-07 – 2016-03-10 (×4): 75 mg via ORAL
  Filled 2016-03-06 (×4): qty 1

## 2016-03-06 MED ORDER — INSULIN ASPART 100 UNIT/ML ~~LOC~~ SOLN
10.0000 [IU] | Freq: Once | SUBCUTANEOUS | Status: AC
  Administered 2016-03-06: 10 [IU] via INTRAVENOUS
  Filled 2016-03-06: qty 1

## 2016-03-06 MED ORDER — MORPHINE SULFATE (PF) 4 MG/ML IV SOLN
2.0000 mg | Freq: Once | INTRAVENOUS | Status: AC
  Administered 2016-03-06: 2 mg via INTRAVENOUS
  Filled 2016-03-06: qty 1

## 2016-03-06 MED ORDER — DIGOXIN 125 MCG PO TABS
0.1250 mg | ORAL_TABLET | Freq: Every day | ORAL | Status: DC
Start: 2016-03-07 — End: 2016-03-07

## 2016-03-06 MED ORDER — ACETAMINOPHEN 650 MG RE SUPP: 650.0000 mg | Freq: Four times a day (QID) | RECTAL | Status: DC | PRN

## 2016-03-06 MED ORDER — SODIUM POLYSTYRENE SULFONATE 15 GM/60ML PO SUSP
30.0000 g | Freq: Once | ORAL | Status: AC
  Administered 2016-03-06: 30 g via ORAL
  Filled 2016-03-06: qty 120

## 2016-03-06 MED ORDER — AMIODARONE HCL 200 MG PO TABS
200.0000 mg | ORAL_TABLET | Freq: Two times a day (BID) | ORAL | Status: DC
  Administered 2016-03-07 – 2016-03-10 (×8): 200 mg via ORAL
  Filled 2016-03-06 (×8): qty 1

## 2016-03-06 MED ORDER — TAMSULOSIN HCL 0.4 MG PO CAPS
0.4000 mg | ORAL_CAPSULE | Freq: Every day | ORAL | Status: DC
  Administered 2016-03-07 – 2016-03-10 (×4): 0.4 mg via ORAL
  Filled 2016-03-06 (×4): qty 1

## 2016-03-06 MED ORDER — ONDANSETRON HCL 4 MG PO TABS: 4.0000 mg | ORAL_TABLET | Freq: Four times a day (QID) | ORAL | Status: DC | PRN

## 2016-03-06 MED ORDER — ASPIRIN EC 81 MG PO TBEC
81.0000 mg | DELAYED_RELEASE_TABLET | Freq: Every day | ORAL | Status: DC
  Administered 2016-03-07 – 2016-03-10 (×4): 81 mg via ORAL
  Filled 2016-03-06 (×4): qty 1

## 2016-03-06 MED ORDER — ACETAMINOPHEN 325 MG PO TABS: 650.0000 mg | ORAL_TABLET | Freq: Four times a day (QID) | ORAL | Status: DC | PRN

## 2016-03-06 MED ORDER — LACTATED RINGERS IV SOLN
INTRAVENOUS | Status: DC
  Administered 2016-03-07 – 2016-03-08 (×2): via INTRAVENOUS

## 2016-03-06 MED ORDER — SODIUM CHLORIDE 0.9 % IV BOLUS (SEPSIS)
250.0000 mL | Freq: Once | INTRAVENOUS | Status: AC
  Administered 2016-03-06: 250 mL via INTRAVENOUS

## 2016-03-06 MED ORDER — ROSUVASTATIN CALCIUM 40 MG PO TABS
40.0000 mg | ORAL_TABLET | Freq: Every day | ORAL | Status: DC
  Administered 2016-03-07 – 2016-03-10 (×4): 40 mg via ORAL
  Filled 2016-03-06 (×4): qty 1

## 2016-03-06 MED ORDER — DM-GUAIFENESIN ER 30-600 MG PO TB12
1.0000 | ORAL_TABLET | Freq: Every day | ORAL | Status: DC
  Administered 2016-03-07 – 2016-03-10 (×4): 1 via ORAL
  Filled 2016-03-06 (×4): qty 1

## 2016-03-06 MED ORDER — ONDANSETRON HCL 4 MG/2ML IJ SOLN: 4.0000 mg | Freq: Four times a day (QID) | INTRAMUSCULAR | Status: DC | PRN

## 2016-03-06 MED ORDER — CARVEDILOL 3.125 MG PO TABS
1.5625 mg | ORAL_TABLET | Freq: Two times a day (BID) | ORAL | Status: DC
  Administered 2016-03-07 – 2016-03-09 (×5): 1.5625 mg via ORAL
  Filled 2016-03-06 (×7): qty 1

## 2016-03-06 MED ORDER — SODIUM CHLORIDE 0.9 % IV SOLN
1.0000 g | Freq: Once | INTRAVENOUS | Status: AC
  Administered 2016-03-06: 1 g via INTRAVENOUS
  Filled 2016-03-06: qty 10

## 2016-03-06 MED ORDER — INSULIN ASPART 100 UNIT/ML ~~LOC~~ SOLN
5.0000 [IU] | Freq: Three times a day (TID) | SUBCUTANEOUS | Status: DC
  Administered 2016-03-07: 5 [IU] via SUBCUTANEOUS

## 2016-03-06 MED ORDER — MIRTAZAPINE 15 MG PO TABS
15.0000 mg | ORAL_TABLET | Freq: Every day | ORAL | Status: DC
  Administered 2016-03-07 – 2016-03-09 (×4): 15 mg via ORAL
  Filled 2016-03-06 (×4): qty 1

## 2016-03-06 MED ORDER — DEXTROSE 50 % IV SOLN
1.0000 | Freq: Once | INTRAVENOUS | Status: AC
  Administered 2016-03-06: 50 mL via INTRAVENOUS
  Filled 2016-03-06: qty 50

## 2016-03-06 MED ORDER — ENOXAPARIN SODIUM 40 MG/0.4ML ~~LOC~~ SOLN
40.0000 mg | SUBCUTANEOUS | Status: DC
  Administered 2016-03-07: 40 mg via SUBCUTANEOUS
  Filled 2016-03-06: qty 0.4

## 2016-03-06 NOTE — ED Notes (Signed)
Attempted report X1

## 2016-03-06 NOTE — Addendum Note (Signed)
Encounter addended by: Kennieth Rad, RN on: 03/06/2016  3:58 PM<BR>    Actions taken: Visit diagnoses modified, Order list changed, Diagnosis association updated, Sign clinical note

## 2016-03-06 NOTE — Telephone Encounter (Signed)
Received call from Lab, pt's K is 6.6 with no hemolysis.  Per Dr Haroldine Laws pt needs to report to ER.  Spoke w/pt's daughter, she is aware and will get pt to ER tonight.

## 2016-03-06 NOTE — ED Notes (Signed)
Critical Lab K+= 6.5 MD notified.

## 2016-03-06 NOTE — Patient Instructions (Signed)
Labs today (will call for abnormal results, otherwise no news is good news)  Follow up in 2-3 months
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
## 2016-03-06 NOTE — ED Triage Notes (Signed)
Pt here with family, sent here by PCP for c/o elevated potassium level. K is 6.6 on CMP in the chart. Pt has no complaints at this time.

## 2016-03-07 ENCOUNTER — Observation Stay (HOSPITAL_COMMUNITY): Payer: Commercial Managed Care - HMO

## 2016-03-07 DIAGNOSIS — R338 Other retention of urine: Secondary | ICD-10-CM | POA: Diagnosis not present

## 2016-03-07 DIAGNOSIS — N1330 Unspecified hydronephrosis: Secondary | ICD-10-CM | POA: Diagnosis not present

## 2016-03-07 DIAGNOSIS — I25810 Atherosclerosis of coronary artery bypass graft(s) without angina pectoris: Secondary | ICD-10-CM

## 2016-03-07 DIAGNOSIS — L97529 Non-pressure chronic ulcer of other part of left foot with unspecified severity: Secondary | ICD-10-CM

## 2016-03-07 DIAGNOSIS — N390 Urinary tract infection, site not specified: Secondary | ICD-10-CM | POA: Diagnosis not present

## 2016-03-07 DIAGNOSIS — E11621 Type 2 diabetes mellitus with foot ulcer: Secondary | ICD-10-CM

## 2016-03-07 DIAGNOSIS — Z95810 Presence of automatic (implantable) cardiac defibrillator: Secondary | ICD-10-CM | POA: Diagnosis not present

## 2016-03-07 DIAGNOSIS — N183 Chronic kidney disease, stage 3 (moderate): Secondary | ICD-10-CM

## 2016-03-07 DIAGNOSIS — I5042 Chronic combined systolic (congestive) and diastolic (congestive) heart failure: Secondary | ICD-10-CM

## 2016-03-07 DIAGNOSIS — N184 Chronic kidney disease, stage 4 (severe): Secondary | ICD-10-CM | POA: Diagnosis not present

## 2016-03-07 DIAGNOSIS — I4892 Unspecified atrial flutter: Secondary | ICD-10-CM | POA: Diagnosis present

## 2016-03-07 DIAGNOSIS — N401 Enlarged prostate with lower urinary tract symptoms: Secondary | ICD-10-CM | POA: Diagnosis not present

## 2016-03-07 DIAGNOSIS — E875 Hyperkalemia: Secondary | ICD-10-CM

## 2016-03-07 DIAGNOSIS — N179 Acute kidney failure, unspecified: Secondary | ICD-10-CM | POA: Diagnosis not present

## 2016-03-07 DIAGNOSIS — I5043 Acute on chronic combined systolic (congestive) and diastolic (congestive) heart failure: Secondary | ICD-10-CM | POA: Diagnosis not present

## 2016-03-07 DIAGNOSIS — I130 Hypertensive heart and chronic kidney disease with heart failure and stage 1 through stage 4 chronic kidney disease, or unspecified chronic kidney disease: Secondary | ICD-10-CM | POA: Diagnosis not present

## 2016-03-07 DIAGNOSIS — N3000 Acute cystitis without hematuria: Secondary | ICD-10-CM

## 2016-03-07 DIAGNOSIS — N320 Bladder-neck obstruction: Secondary | ICD-10-CM

## 2016-03-07 DIAGNOSIS — E1122 Type 2 diabetes mellitus with diabetic chronic kidney disease: Secondary | ICD-10-CM | POA: Diagnosis not present

## 2016-03-07 LAB — GLUCOSE, CAPILLARY
GLUCOSE-CAPILLARY: 201 mg/dL — AB (ref 65–99)
GLUCOSE-CAPILLARY: 245 mg/dL — AB (ref 65–99)
GLUCOSE-CAPILLARY: 153 mg/dL — AB (ref 65–99)
GLUCOSE-CAPILLARY: 156 mg/dL — AB (ref 65–99)

## 2016-03-07 LAB — URINALYSIS, ROUTINE W REFLEX MICROSCOPIC
Bilirubin Urine: NEGATIVE
Glucose, UA: NEGATIVE mg/dL
Ketones, ur: 15 mg/dL — AB
Nitrite: NEGATIVE
PH: 6 (ref 5.0–8.0)
Protein, ur: 100 mg/dL — AB
SPECIFIC GRAVITY, URINE: 1.025 (ref 1.005–1.030)

## 2016-03-07 LAB — CBC
HEMATOCRIT: 32.1 % — AB (ref 39.0–52.0)
Hemoglobin: 9.7 g/dL — ABNORMAL LOW (ref 13.0–17.0)
MCH: 25.5 pg — AB (ref 26.0–34.0)
MCHC: 30.2 g/dL (ref 30.0–36.0)
MCV: 84.5 fL (ref 78.0–100.0)
Platelets: 277 10*3/uL (ref 150–400)
RBC: 3.8 MIL/uL — ABNORMAL LOW (ref 4.22–5.81)
RDW: 16.3 % — ABNORMAL HIGH (ref 11.5–15.5)
WBC: 9.6 10*3/uL (ref 4.0–10.5)

## 2016-03-07 LAB — URINE MICROSCOPIC-ADD ON

## 2016-03-07 LAB — BASIC METABOLIC PANEL
ANION GAP: 10 (ref 5–15)
ANION GAP: 6 (ref 5–15)
ANION GAP: 8 (ref 5–15)
ANION GAP: 9 (ref 5–15)
BUN: 48 mg/dL — ABNORMAL HIGH (ref 6–20)
BUN: 52 mg/dL — ABNORMAL HIGH (ref 6–20)
BUN: 53 mg/dL — ABNORMAL HIGH (ref 6–20)
BUN: 57 mg/dL — ABNORMAL HIGH (ref 6–20)
CALCIUM: 8.5 mg/dL — AB (ref 8.9–10.3)
CALCIUM: 9.1 mg/dL (ref 8.9–10.3)
CHLORIDE: 105 mmol/L (ref 101–111)
CO2: 24 mmol/L (ref 22–32)
CO2: 25 mmol/L (ref 22–32)
CO2: 25 mmol/L (ref 22–32)
CO2: 28 mmol/L (ref 22–32)
Calcium: 8.5 mg/dL — ABNORMAL LOW (ref 8.9–10.3)
Calcium: 8.3 mg/dL — ABNORMAL LOW (ref 8.9–10.3)
Chloride: 104 mmol/L (ref 101–111)
Chloride: 105 mmol/L (ref 101–111)
Chloride: 106 mmol/L (ref 101–111)
Creatinine, Ser: 2.34 mg/dL — ABNORMAL HIGH (ref 0.61–1.24)
Creatinine, Ser: 2.44 mg/dL — ABNORMAL HIGH (ref 0.61–1.24)
Creatinine, Ser: 2.46 mg/dL — ABNORMAL HIGH (ref 0.61–1.24)
Creatinine, Ser: 2.61 mg/dL — ABNORMAL HIGH (ref 0.61–1.24)
GFR calc Af Amer: 27 mL/min — ABNORMAL LOW (ref >60)
GFR calc Af Amer: 29 mL/min — ABNORMAL LOW (ref >60)
GFR calc Af Amer: 29 mL/min — ABNORMAL LOW (ref >60)
GFR calc Af Amer: 31 mL/min — ABNORMAL LOW (ref >60)
GFR calc non Af Amer: 23 mL/min — ABNORMAL LOW (ref >60)
GFR calc non Af Amer: 25 mL/min — ABNORMAL LOW (ref >60)
GFR calc non Af Amer: 25 mL/min — ABNORMAL LOW (ref >60)
GFR calc non Af Amer: 27 mL/min — ABNORMAL LOW (ref >60)
GLUCOSE: 149 mg/dL — AB (ref 65–99)
GLUCOSE: 166 mg/dL — AB (ref 65–99)
GLUCOSE: 232 mg/dL — AB (ref 65–99)
GLUCOSE: 325 mg/dL — AB (ref 65–99)
POTASSIUM: 5.2 mmol/L — AB (ref 3.5–5.1)
POTASSIUM: 5.2 mmol/L — AB (ref 3.5–5.1)
POTASSIUM: 5 mmol/L (ref 3.5–5.1)
Potassium: 5.9 mmol/L — ABNORMAL HIGH (ref 3.5–5.1)
Sodium: 138 mmol/L (ref 135–145)
Sodium: 139 mmol/L (ref 135–145)
Sodium: 139 mmol/L (ref 135–145)
Sodium: 139 mmol/L (ref 135–145)

## 2016-03-07 LAB — NA AND K (SODIUM & POTASSIUM), RAND UR
Potassium Urine: 31 mmol/L
Sodium, Ur: 83 mmol/L

## 2016-03-07 MED ORDER — DIGOXIN 125 MCG PO TABS: 0.1250 mg | ORAL_TABLET | Freq: Every day | ORAL | Status: DC

## 2016-03-07 MED ORDER — PRO-STAT SUGAR FREE PO LIQD
30.0000 mL | Freq: Two times a day (BID) | ORAL | Status: DC
  Administered 2016-03-07 – 2016-03-10 (×7): 30 mL via ORAL
  Filled 2016-03-07 (×6): qty 30

## 2016-03-07 MED ORDER — ENOXAPARIN SODIUM 30 MG/0.3ML ~~LOC~~ SOLN
30.0000 mg | Freq: Every day | SUBCUTANEOUS | Status: DC
  Administered 2016-03-08 – 2016-03-10 (×3): 30 mg via SUBCUTANEOUS
  Filled 2016-03-07 (×3): qty 0.3

## 2016-03-07 MED ORDER — ADULT MULTIVITAMIN W/MINERALS CH
1.0000 | ORAL_TABLET | Freq: Every day | ORAL | Status: DC
  Administered 2016-03-07 – 2016-03-10 (×4): 1 via ORAL
  Filled 2016-03-07 (×4): qty 1

## 2016-03-07 MED ORDER — INSULIN ASPART 100 UNIT/ML ~~LOC~~ SOLN
0.0000 [IU] | Freq: Three times a day (TID) | SUBCUTANEOUS | Status: DC
  Administered 2016-03-07: 2 [IU] via SUBCUTANEOUS
  Administered 2016-03-07: 3 [IU] via SUBCUTANEOUS
  Administered 2016-03-08: 1 [IU] via SUBCUTANEOUS
  Administered 2016-03-08: 5 [IU] via SUBCUTANEOUS
  Administered 2016-03-09: 3 [IU] via SUBCUTANEOUS
  Administered 2016-03-09: 1 [IU] via SUBCUTANEOUS
  Administered 2016-03-10: 3 [IU] via SUBCUTANEOUS

## 2016-03-07 MED ORDER — CIPROFLOXACIN IN D5W 400 MG/200ML IV SOLN
400.0000 mg | INTRAVENOUS | Status: DC
  Administered 2016-03-07 – 2016-03-09 (×3): 400 mg via INTRAVENOUS
  Filled 2016-03-07 (×3): qty 200

## 2016-03-07 MED ORDER — LIVING WELL WITH DIABETES BOOK
Freq: Once | Status: AC
  Administered 2016-03-07: 12:00:00
  Filled 2016-03-07: qty 1
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
## 2016-03-07 NOTE — Progress Notes (Signed)
PROGRESS NOTE    [name] [name]  MEQ:[id number] DOB: 1945-07-21 DOA: 03/06/2016 PCP: [name], MD    Brief Narrative:  Patient is a 70 year old gentleman history of diabetes, CVA, obstructive sleep apnea not on C Pap, coronary artery disease, hypertension, chronic kidney disease stage III, history of AICD who was sent to the ED from cardiologist's office secondary to hyper K anemia as well as acute on chronic kidney disease. Patient had seen his urologist 2 months ago renal ultrasound was done then which was unremarkable. Patient subsequently had a hip fracture and had been at the skilled nursing facility. Patient fell being followed by neurology for constant incontinence, neurogenic bladder and stool incontinence. Renal ultrasound done on admission noted bilateral hydronephrosis. Urology has been consulted.   Assessment & Plan:   Principal Problem:   Hyperkalemia Active Problems:   Bladder outlet obstruction   Automatic implantable cardioverter-defibrillator in situ   UTI (urinary tract infection)   Acute renal failure superimposed on stage 3 chronic kidney disease (HCC)   Diabetic ulcer of left foot associated with type 2 diabetes mellitus (HCC)   Protein-calorie malnutrition, severe   Atrial flutter (HCC)  #1 hyperkalemia Likely secondary to acute on chronic kidney disease in the setting of diuretics. Patient status post calcium gluconate, insulin and glucose in the ED and Kayexalate. Patient on IV fluids and diuretics on hold. Potassium trending down and potassium currently at 5.2 from 6.6 on admission. Repeat BMET this afternoon and if potassium is still a 5.2 we'll give a dose of Kayexalate. Follow.  #2 acute on chronic kidney disease stage III Likely secondary to post renal azotemia secondary to bladder outlet obstruction as noted on renal ultrasound. Patient on IV fluids and diuretics on hold. Urinalysis positive for UTI. 100 protein. No significant change with renal  function. Baseline creatinine 1.7. Place a Foley catheter. Follow urine output. Consultation with urology for further evaluation and management.  #3 bladder outlet obstruction/bilateral hydronephrosis Patient noted to have bilateral hydronephrosis on renal ultrasound. Place a Foley catheter. Continue Proscar. Urology consultation for further evaluation and management. Patient may need a cystoscopy.  #4 UTI Urine cultures pending. Continue IV Ciprofloxacin.  #5 diabetes mellitus Check a hemoglobin A1c. Last hemoglobin A1c was 12.0 10/27/2015. CBGs at 201. Place on a sliding scale insulin. Follow for now. Continue home dose Lantus.  #6 ?? atrial flutter Patient status post AICD. EKG this morning with normal sinus rhythm with first-degree AV block.  #7 foot ulcers Wound care consultation pending. Will likely need to go back to the wound care clinic once discharged.  #8 chronic combined systolic and diastolic heart failure/ischemic cardiomyopathy EF of 15% per 2-D echo 2017 with mild to moderate RV dysfunction function. Due to patient's worsening renal status patient's diuretics on hold. Patient on gentle hydration. Monitor closely.  #9 coronary artery disease Status post DES LCX July 2017. Continue aspirin, beta blocker, statin, Plavix.  #10 history of V. tach Continue amiodarone 200 mg daily. TSH of 2.345. LFTs within normal limits.  #11 status post right hip fracture and right hip hemiarthroplasty Patient being followed by neurology in the outpatient setting. PT/OT.  DVT prophylaxis: Lovenox Code Status: Full Family Communication: Updated patient. No family at bedside. Disposition Plan: Home once renal function is back to baseline and hyperkalemia has resolved.   Consultants:   Urology pending  Procedures:  Renal ultrasound 03/07/2016    Antimicrobials:   IV ciprofloxacin 03/07/2016   Subjective: Patient denies CP. No SOB. Patient states  he is making  urine.  Objective: Vitals:   03/06/16 2230 03/06/16 2308 03/07/16 0151 03/07/16 0631  BP: 110/73 123/66 119/67 127/74  Pulse: 72 72 72 69  Resp: 14 18 16 16   Temp:  98.7 F (37.1 C) 98 F (36.7 C) 98.2 F (36.8 C)  TempSrc:  Tympanic Oral Oral  SpO2: 100% 100% 98% 100%  Weight:  64.1 kg (141 lb 5 oz)  64.7 kg (142 lb 10.2 oz)  Height:  6\' 2"  (1.88 m)      Intake/Output Summary (Last 24 hours) at 03/07/16 1114 Last data filed at 03/07/16 0900  Gross per 24 hour  Intake              350 ml  Output              300 ml  Net               50 ml   Filed Weights   03/06/16 1835 03/06/16 2308 03/07/16 0631  Weight: 68 kg (150 lb) 64.1 kg (141 lb 5 oz) 64.7 kg (142 lb 10.2 oz)    Examination:  General exam: Appears calm and comfortable  Respiratory system: Clear to auscultation. Respiratory effort normal. Cardiovascular system: S1 & S2 heard, RRR. No JVD, murmurs, rubs, gallops or clicks. No pedal edema. Gastrointestinal system: Abdomen is nondistended, soft and nontender. No organomegaly or masses felt. Normal bowel sounds heard. Central nervous system: Alert and oriented. No focal neurological deficits. Extremities: Symmetric 5 x 5 power. Skin: No rashes, lesions or ulcers Psychiatry: Judgement and insight appear normal. Mood & affect appropriate.     Data Reviewed: I have personally reviewed following labs and imaging studies  CBC:  Recent Labs Lab 03/06/16 1847 03/07/16 0652  WBC 13.3* 9.6  HGB 11.3* 9.7*  HCT 36.7* 32.1*  MCV 84.6 84.5  PLT 347 254   Basic Metabolic Panel:  Recent Labs Lab 03/06/16 1604 03/06/16 1847 03/07/16 0011 03/07/16 0652  NA 137 137 138 139  K 6.6* 6.5* 5.9* 5.2*  CL 103 104 104 105  CO2 25 26 24 25   GLUCOSE 395* 281* 325* 232*  BUN 55* 55* 57* 53*  CREATININE 2.62* 2.48* 2.61* 2.46*  CALCIUM 8.9 9.1 9.1 8.5*   GFR: Estimated Creatinine Clearance: 25.9 mL/min (by C-G formula based on SCr of 2.46 mg/dL (H)). Liver Function  Tests:  Recent Labs Lab 03/06/16 1604 03/06/16 1847  AST 26 26  ALT 31 33  ALKPHOS 171* 170*  BILITOT 0.3 0.5  PROT 6.9 7.4  ALBUMIN 2.4* 2.6*   No results for input(s): LIPASE, AMYLASE in the last 168 hours. No results for input(s): AMMONIA in the last 168 hours. Coagulation Profile: No results for input(s): INR, PROTIME in the last 168 hours. Cardiac Enzymes: No results for input(s): CKTOTAL, CKMB, CKMBINDEX, TROPONINI in the last 168 hours. BNP (last 3 results) No results for input(s): PROBNP in the last 8760 hours. HbA1C: No results for input(s): HGBA1C in the last 72 hours. CBG:  Recent Labs Lab 03/06/16 2342 03/07/16 0725  GLUCAP 201* 201*   Lipid Profile: No results for input(s): CHOL, HDL, LDLCALC, TRIG, CHOLHDL, LDLDIRECT in the last 72 hours. Thyroid Function Tests:  Recent Labs  03/06/16 1605  TSH 2.345   Anemia Panel: No results for input(s): VITAMINB12, FOLATE, FERRITIN, TIBC, IRON, RETICCTPCT in the last 72 hours. Sepsis Labs: No results for input(s): PROCALCITON, LATICACIDVEN in the last 168 hours.  No results found for this  or any previous visit (from the past 240 hour(s)).       Radiology Studies: US Renal  Result Date: 03/07/2016 CLINICAL DATA:  Acute renal failure EXAM: RENAL / URINARY TRACT ULTRASOUND COMPLETE COMPARISON:  None. FINDINGS: Right Kidney: Length: 12.9 cm. Moderate hydronephrosis with some echogenic debris in the central renal collecting system. No focal renal mass. Left Kidney: Length: 10.8 cm moderate hydronephrosis and proximal ureterectasis. No focal parenchymal lesion. Bladder: Physiologically distended. Somewhat irregular wall thickening along the posterior wall. Prostate 5.8 x 3.8 x 6 cm. IMPRESSION: 1. Moderate bilateral hydronephrosis. 2. Mild prostatic enlargement with irregular asymmetric bladder wall thickening. Consider urologic consultation to exclude neoplasm. Electronically Signed   By: Lucrezia Europe M.D.   On:  03/07/2016 09:01        Scheduled Meds: . amiodarone  200 mg Oral BID  . aspirin EC  81 mg Oral Daily  . carvedilol  1.5625 mg Oral BID WC  . ciprofloxacin  400 mg Intravenous Q24H  . clopidogrel  75 mg Oral Daily  . dextromethorphan-guaiFENesin  1 tablet Oral Daily  . enoxaparin (LOVENOX) injection  40 mg Subcutaneous Q24H  . finasteride  5 mg Oral Daily  . insulin aspart  0-9 Units Subcutaneous TID WC  . insulin glargine  20 Units Subcutaneous QHS  . mirtazapine  15 mg Oral QHS  . rosuvastatin  40 mg Oral Daily  . sodium chloride flush  3 mL Intravenous Q12H  . tamsulosin  0.4 mg Oral Daily   Continuous Infusions: . lactated ringers 75 mL/hr at 03/07/16 0141     LOS: 0 days    Time spent: [place], MD [facility] Pager [phone]  If 7PM-7AM, please contact night-coverage [email or web address] Password [email or web address] 03/07/2016, 11:14 AM

## 2016-03-07 NOTE — Care Management Obs Status (Signed)
[place] NOTIFICATION   Patient Details  Name: [name] [name] MRN: [id number] Date of Birth: Oct 07, 1945   Medicare Observation Status Notification Given:  Yes (obs letter explained to patient, he has no questions)    Apolonio Schneiders, RN 03/07/2016, 6:54 PM

## 2016-03-07 NOTE — Consult Note (Signed)
[place] Nurse wound consult note Reason for Consult: diabetic ulcer Wound type:diabetic ulcer, unstageable, stage III,  Pressure Ulcer POA: Yes Measurement: Left foot plantar side of base of 5th toe 1cm x 1cm x 0.2cm Callused ulcer, no drainage, wound bed covered callus Lateral L foot base of 5th toe 1cm x 1.5cm unstageable, eschar, intact, no drainage Left heel 3cm x 4cm unstageable, eschar, no drainage Left med foot near base of 1st toe 2cm x 2cm ,eschar, no drainage or odor, skin dry Right lat heel 3cm x 3cm eschar, no drainage Right 1st toe 0.3cm x 0.3cm eschar no drainage Right 3rd toe 0.3cm x 0.3cm x 0.1cm full thickness pink bed R lat calcaneus 0.5cm x 0.5cm eschar no drainage R plantar foot base of 5th toe diabetic ulcer, bed dark, no drainage Wound bed:see above Drainage (amount, consistency, odor) see above Periwound:see above Dressing procedure/placement/frequency: I have provided nurses with orders for cleansing and placing foam dsg for protection, bedside RN to apply Heel allevyn  (lawson # [phone]) when it arrives. Pt refuses prevalon boots. Encouraged to elevate heels.  Pt states his wife is a retired [employer] scientist of 20+ years and cares for his wounds. Wife not present at my visit. We will not follow, but will remain available to this patient, to nursing, and the medical and/or surgical teams.  Please re-consult if we need to assist further.    Fara Olden, RN-C, WTA-C Wound Treatment Associate

## 2016-03-07 NOTE — Progress Notes (Signed)
Initial Nutrition Assessment  DOCUMENTATION CODES:   Severe malnutrition in context of chronic illness, Underweight  INTERVENTION:  Provide 30 ml pro-stat BID, each supplement provides 15 grams of protein and 100 kcal Provide Multivitamin with minerals daily Provide HS snack Daily Provided and discussed "Suggestions for Increasing Calories and Protein" handout from the Academy of [employer] Dietetics   NUTRITION DIAGNOSIS:   Increased nutrient needs related to wound healing, other (see comment) (malnutrition/low body weight) as evidenced by estimated needs.   GOAL:   Patient will meet greater than or equal to 90% of their needs   MONITOR:   PO intake, Supplement acceptance, Labs, Skin, I & O's  REASON FOR ASSESSMENT:   Consult Assessment of nutrition requirement/status  ASSESSMENT:   70 y.o. male with medical history significant of DM, CVA, OSA not on CPAP, CAD, HTN, CKD, and AICD presenting as cardiology referral due to hyperkalemia.   Pt reports that several months ago he had a poor appetite and lost down to 140 lbs, but he was started on Remeron and for the past several months he has been eating well. Wife at bedside reports that patient eats very well with 3 meals daily. He eats eggs, cereal and fruit for breakfast, a sandwich for lunch, and dinner usually includes a meat, vegetables, and potatoes. He also snacks on yogurt and peanut butter crackers. He takes a daily multivitamin and probiotic supplement. He reports taking insulin TID with meals and every evening. Pt reports eating all, but a few bites of eggs this morning.  Pt has severe muscle wasting and severe fat wasting per nutrition-focused physical exam. RD discussed ways to increase protein and calories in pt's diet. He is agreeable to receiving Pro-stat and snacks.   Labs: elevated glucose, high potassium, high BUN, low calcium, low GFR, low hemoglobin  Diet Order:  Diet heart healthy/carb modified Room  service appropriate? Yes; Fluid consistency: Thin  Skin:  Wound (see comment) (Stage III pressure ulcer on L foot; unstageable PU on L heel)  Last BM:  11/14  Height:   Ht Readings from Last 1 Encounters:  03/06/16 6\' 2"  (1.88 m)    Weight:   Wt Readings from Last 1 Encounters:  03/07/16 142 lb 10.2 oz (64.7 kg)    Ideal Body Weight:  86.36 kg  BMI:  Body mass index is 18.31 kg/m.  Estimated Nutritional Needs:   Kcal:  2100-2300  Protein:  90-105 grams  Fluid:  2.1 L/day  EDUCATION NEEDS:   No education needs identified at this time  [place], CSP, LDN Inpatient Clinical Dietitian Pager: [phone] After Hours Pager: [phone]

## 2016-03-08 ENCOUNTER — Other Ambulatory Visit: Payer: Commercial Managed Care - HMO

## 2016-03-08 DIAGNOSIS — Z95810 Presence of automatic (implantable) cardiac defibrillator: Secondary | ICD-10-CM | POA: Diagnosis not present

## 2016-03-08 DIAGNOSIS — N183 Chronic kidney disease, stage 3 (moderate): Secondary | ICD-10-CM | POA: Diagnosis not present

## 2016-03-08 DIAGNOSIS — E875 Hyperkalemia: Secondary | ICD-10-CM | POA: Diagnosis not present

## 2016-03-08 DIAGNOSIS — R338 Other retention of urine: Secondary | ICD-10-CM | POA: Diagnosis not present

## 2016-03-08 DIAGNOSIS — I5042 Chronic combined systolic (congestive) and diastolic (congestive) heart failure: Secondary | ICD-10-CM | POA: Diagnosis not present

## 2016-03-08 DIAGNOSIS — N170 Acute kidney failure with tubular necrosis: Secondary | ICD-10-CM | POA: Diagnosis not present

## 2016-03-08 DIAGNOSIS — N1330 Unspecified hydronephrosis: Secondary | ICD-10-CM | POA: Diagnosis not present

## 2016-03-08 DIAGNOSIS — N320 Bladder-neck obstruction: Secondary | ICD-10-CM | POA: Diagnosis not present

## 2016-03-08 DIAGNOSIS — N179 Acute kidney failure, unspecified: Secondary | ICD-10-CM | POA: Diagnosis not present

## 2016-03-08 LAB — GLUCOSE, CAPILLARY
GLUCOSE-CAPILLARY: 114 mg/dL — AB (ref 65–99)
GLUCOSE-CAPILLARY: 276 mg/dL — AB (ref 65–99)
GLUCOSE-CAPILLARY: 110 mg/dL — AB (ref 65–99)
Glucose-Capillary: 126 mg/dL — ABNORMAL HIGH (ref 65–99)

## 2016-03-08 LAB — BASIC METABOLIC PANEL
ANION GAP: 6 (ref 5–15)
BUN: 48 mg/dL — ABNORMAL HIGH (ref 6–20)
CHLORIDE: 109 mmol/L (ref 101–111)
CO2: 26 mmol/L (ref 22–32)
Calcium: 8.4 mg/dL — ABNORMAL LOW (ref 8.9–10.3)
Creatinine, Ser: 2.29 mg/dL — ABNORMAL HIGH (ref 0.61–1.24)
GFR calc non Af Amer: 27 mL/min — ABNORMAL LOW (ref >60)
GFR, EST AFRICAN AMERICAN: 32 mL/min — AB (ref >60)
Glucose, Bld: 147 mg/dL — ABNORMAL HIGH (ref 65–99)
POTASSIUM: 5.2 mmol/L — AB (ref 3.5–5.1)
Sodium: 141 mmol/L (ref 135–145)

## 2016-03-08 LAB — CBC
HCT: 32.1 % — ABNORMAL LOW (ref 39.0–52.0)
Hemoglobin: 9.7 g/dL — ABNORMAL LOW (ref 13.0–17.0)
MCH: 25.6 pg — AB (ref 26.0–34.0)
MCHC: 30.2 g/dL (ref 30.0–36.0)
MCV: 84.7 fL (ref 78.0–100.0)
PLATELETS: 273 10*3/uL (ref 150–400)
RBC: 3.79 MIL/uL — ABNORMAL LOW (ref 4.22–5.81)
RDW: 16.3 % — ABNORMAL HIGH (ref 11.5–15.5)
WBC: 10.8 10*3/uL — ABNORMAL HIGH (ref 4.0–10.5)

## 2016-03-08 LAB — URINE CULTURE: CULTURE: NO GROWTH

## 2016-03-08 LAB — RENAL FUNCTION PANEL
Albumin: 2 g/dL — ABNORMAL LOW (ref 3.5–5.0)
Anion gap: 6 (ref 5–15)
BUN: 47 mg/dL — AB (ref 6–20)
CALCIUM: 8.4 mg/dL — AB (ref 8.9–10.3)
CHLORIDE: 107 mmol/L (ref 101–111)
CO2: 27 mmol/L (ref 22–32)
CREATININE: 2.15 mg/dL — AB (ref 0.61–1.24)
GFR calc Af Amer: 34 mL/min — ABNORMAL LOW (ref >60)
GFR calc non Af Amer: 30 mL/min — ABNORMAL LOW (ref >60)
GLUCOSE: 138 mg/dL — AB (ref 65–99)
Phosphorus: 3.9 mg/dL (ref 2.5–4.6)
Potassium: 5 mmol/L (ref 3.5–5.1)
SODIUM: 140 mmol/L (ref 135–145)

## 2016-03-08 NOTE — Progress Notes (Signed)
PROGRESS NOTE    [name] [name]  QPY:[id number] DOB: 10/02/1945 DOA: 03/06/2016 PCP: [name], MD    Brief Narrative:  Patient is a 70 year old gentleman history of diabetes, CVA, obstructive sleep apnea not on C Pap, coronary artery disease, hypertension, chronic kidney disease stage III, history of AICD who was sent to the ED from cardiologist's office secondary to hyper K anemia as well as acute on chronic kidney disease. Patient had seen his urologist 2 months ago renal ultrasound was done then which was unremarkable. Patient subsequently had a hip fracture and had been at the skilled nursing facility. Patient fell being followed by neurology for constant incontinence, neurogenic bladder and stool incontinence. Renal ultrasound done on admission noted bilateral hydronephrosis. Urology has been consulted.   Assessment & Plan:   Principal Problem:   Hyperkalemia Active Problems:   Bladder outlet obstruction   Automatic implantable cardioverter-defibrillator in situ   UTI (urinary tract infection)   Acute renal failure superimposed on stage 3 chronic kidney disease (HCC)   Diabetic ulcer of left foot associated with type 2 diabetes mellitus (HCC)   Protein-calorie malnutrition, severe   Atrial flutter (HCC)   Coronary artery disease involving coronary bypass graft of native heart without angina pectoris   Chronic combined systolic and diastolic congestive heart failure, NYHA class 3 (HCC)  #1 hyperkalemia Likely secondary to acute on chronic kidney disease secondary to bilateral hydronephrosis/urinary retention. Patient status post calcium gluconate, insulin and glucose in the ED and Kayexalate. Patient on IV fluids and diuretics on hold. Potassium trending down and potassium currently at 5.0 from 5.2 from 6.6 on admission. Patient with good urine output. Place on a renal diet. Saline lock IV fluids. Follow.   #2 acute on chronic kidney disease stage III Likely secondary to  post renal azotemia secondary to bladder outlet obstruction as noted on renal ultrasound. Patient on IV fluids and diuretics on hold. Urinalysis positive for UTI. 100 protein. No significant change with renal function. Baseline creatinine 1.3-1.7. Placed a Foley catheter. Patient with good urine output of 242 5 mL over the past 24 hours. Renal function trending down and creatinine currently at 2.15. Patient has been seen by urology who recommended continued Foley catheter placement follow renal function. Urology following and appreciate input and recommendations.  #3 bladder outlet obstruction/bilateral hydronephrosis/urinary retention Patient noted to have bilateral hydronephrosis on renal ultrasound. Placed a Foley catheter. Patient with good urine output of 2.425 L over the past 24 hours. Renal function trending down a creatinine currently at 2.15. Continue Proscar. Patient has been seen in consultation by urology recommended leave an indwelling catheter for now until he can be documented that patient's hydronephrosis has resolved and renal function returned to baseline. Patient will also need to follow-up with his urologist, Dr. Junious Silk. Urology also recommended repeat ultrasound within the next few days to reassess hydronephrosis. Appreciate urology input and recommendations.   #4 prob UTI versus bacteriuria Urine cultures with multiple species present. Repeat urine cultures pending. Continue IV Ciprofloxacin.  #5 diabetes mellitus Check a hemoglobin A1c. Last hemoglobin A1c was 12.0 10/27/2015. CBGs at 126-276. Place on a sliding scale insulin. Follow for now. Continue home dose Lantus.  #6 ?? atrial flutter Patient status post AICD. EKG this morning with normal sinus rhythm with first-degree AV block.  #7 foot ulcers Wound care consultation pending. Will likely need to go back to the wound care clinic once discharged.  #8 chronic combined systolic and diastolic heart failure/ischemic  cardiomyopathy EF  of 15% per 2-D echo 2017 with mild to moderate RV dysfunction function. Due to patient's worsening renal status patient's diuretics on hold. Patient on gentle hydration. Saline lock IV fluids. Cardiology has been consulted for volume assessment and management. Cardiology agree with currently holding diuretics at this time. Monitor closely.  #9 coronary artery disease Status post DES LCX July 2017. Continue aspirin, beta blocker, statin, Plavix. Diuretics on hold. Cardiology has been consulted and I agree with holding diuretics at this time.  #10 history of V. tach Continue amiodarone 200 mg daily. TSH of 2.345. LFTs within normal limits.  #11 status post right hip fracture and right hip hemiarthroplasty Patient being followed by neurology in the outpatient setting. PT/OT.  DVT prophylaxis: Lovenox Code Status: Full Family Communication: Updated patient. No family at bedside. Disposition Plan: Home once renal function is back to baseline and hyperkalemia has resolved and recommendations in terms of patient's diuretics per cardiology post discharge.   Consultants:   Urology: Dr. Alinda Money 03/07/2016  Cardiology/heart failure: Dr.Bensimhon 03/08/2016  Procedures:  Renal ultrasound 03/07/2016    Antimicrobials:   IV ciprofloxacin 03/07/2016   Subjective: Patient denies CP. No SOB. Patient denies any dysuria.  Objective: Vitals:   03/08/16 0739 03/08/16 0814 03/08/16 0845 03/08/16 1140  BP: 106/65 (!) 113/56 (!) 101/48 (!) 112/55  Pulse: 69 70 73 69  Resp:  18 18 18   Temp:  98.9 F (37.2 C) 98.9 F (37.2 C) 98.3 F (36.8 C)  TempSrc:  Oral Oral Oral  SpO2:  99% 100% 99%  Weight:      Height:        Intake/Output Summary (Last 24 hours) at 03/08/16 1244 Last data filed at 03/08/16 1147  Gross per 24 hour  Intake          2308.75 ml  Output             2725 ml  Net          -416.25 ml   Filed Weights   03/06/16 2308 03/07/16 0631 03/08/16 0611    Weight: 64.1 kg (141 lb 5 oz) 64.7 kg (142 lb 10.2 oz) 66.6 kg (146 lb 13.2 oz)    Examination:  General exam: Appears calm and comfortable  Respiratory system: Clear to auscultation. Respiratory effort normal. Cardiovascular system: S1 & S2 heard, RRR. 2/6 SEM in LUSB. No JVD, rubs, gallops or clicks. No pedal edema. Gastrointestinal system: Abdomen is nondistended, soft and nontender. No organomegaly or masses felt. Normal bowel sounds heard. Central nervous system: Alert and oriented. No focal neurological deficits. Extremities: Symmetric 5 x 5 power. Skin: No rashes, lesions or ulcers Psychiatry: Judgement and insight appear normal. Mood & affect appropriate.     Data Reviewed: I have personally reviewed following labs and imaging studies  CBC:  Recent Labs Lab 03/06/16 1847 03/07/16 0652 03/08/16 0405  WBC 13.3* 9.6 10.8*  HGB 11.3* 9.7* 9.7*  HCT 36.7* 32.1* 32.1*  MCV 84.6 84.5 84.7  PLT 347 277 825   Basic Metabolic Panel:  Recent Labs Lab 03/07/16 0652 03/07/16 1109 03/07/16 1753 03/08/16 0010 03/08/16 0405  NA 139 139 139 141 140  K 5.2* 5.2* 5.0 5.2* 5.0  CL 105 106 105 109 107  CO2 25 25 28 26 27   GLUCOSE 232* 166* 149* 147* 138*  BUN 53* 52* 48* 48* 47*  CREATININE 2.46* 2.44* 2.34* 2.29* 2.15*  CALCIUM 8.5* 8.5* 8.3* 8.4* 8.4*  PHOS  --   --   --   --  3.9   GFR: Estimated Creatinine Clearance: 30.5 mL/min (by C-G formula based on SCr of 2.15 mg/dL (H)). Liver Function Tests:  Recent Labs Lab 03/06/16 1604 03/06/16 1847 03/08/16 0405  AST 26 26  --   ALT 31 33  --   ALKPHOS 171* 170*  --   BILITOT 0.3 0.5  --   PROT 6.9 7.4  --   ALBUMIN 2.4* 2.6* 2.0*   No results for input(s): LIPASE, AMYLASE in the last 168 hours. No results for input(s): AMMONIA in the last 168 hours. Coagulation Profile: No results for input(s): INR, PROTIME in the last 168 hours. Cardiac Enzymes: No results for input(s): CKTOTAL, CKMB, CKMBINDEX, TROPONINI  in the last 168 hours. BNP (last 3 results) No results for input(s): PROBNP in the last 8760 hours. HbA1C: No results for input(s): HGBA1C in the last 72 hours. CBG:  Recent Labs Lab 03/07/16 1200 03/07/16 1638 03/07/16 2046 03/08/16 0614 03/08/16 1142  GLUCAP 245* 153* 156* 114* 276*   Lipid Profile: No results for input(s): CHOL, HDL, LDLCALC, TRIG, CHOLHDL, LDLDIRECT in the last 72 hours. Thyroid Function Tests:  Recent Labs  03/06/16 1605  TSH 2.345   Anemia Panel: No results for input(s): VITAMINB12, FOLATE, FERRITIN, TIBC, IRON, RETICCTPCT in the last 72 hours. Sepsis Labs: No results for input(s): PROCALCITON, LATICACIDVEN in the last 168 hours.  Recent Results (from the past 240 hour(s))  Urine culture     Status: Abnormal   Collection Time: 03/06/16 10:40 PM  Result Value Ref Range Status   Specimen Description URINE, RANDOM  Final   Special Requests NONE  Final   Culture MULTIPLE SPECIES PRESENT, SUGGEST RECOLLECTION (A)  Final   Report Status 03/08/2016 FINAL  Final  Culture, Urine     Status: None   Collection Time: 03/07/16 12:33 PM  Result Value Ref Range Status   Specimen Description URINE, CATHETERIZED  Final   Special Requests NONE  Final   Culture NO GROWTH  Final   Report Status 03/08/2016 FINAL  Final         Radiology Studies: US Renal  Result Date: 03/07/2016 CLINICAL DATA:  Acute renal failure EXAM: RENAL / URINARY TRACT ULTRASOUND COMPLETE COMPARISON:  None. FINDINGS: Right Kidney: Length: 12.9 cm. Moderate hydronephrosis with some echogenic debris in the central renal collecting system. No focal renal mass. Left Kidney: Length: 10.8 cm moderate hydronephrosis and proximal ureterectasis. No focal parenchymal lesion. Bladder: Physiologically distended. Somewhat irregular wall thickening along the posterior wall. Prostate 5.8 x 3.8 x 6 cm. IMPRESSION: 1. Moderate bilateral hydronephrosis. 2. Mild prostatic enlargement with irregular  asymmetric bladder wall thickening. Consider urologic consultation to exclude neoplasm. Electronically Signed   By: Lucrezia Europe M.D.   On: 03/07/2016 09:01        Scheduled Meds: . amiodarone  200 mg Oral BID  . aspirin EC  81 mg Oral Daily  . carvedilol  1.5625 mg Oral BID WC  . ciprofloxacin  400 mg Intravenous Q24H  . clopidogrel  75 mg Oral Daily  . dextromethorphan-guaiFENesin  1 tablet Oral Daily  . enoxaparin (LOVENOX) injection  30 mg Subcutaneous Daily  . feeding supplement (PRO-STAT SUGAR FREE 64)  30 mL Oral BID  . finasteride  5 mg Oral Daily  . insulin aspart  0-9 Units Subcutaneous TID WC  . insulin glargine  20 Units Subcutaneous QHS  . mirtazapine  15 mg Oral QHS  . multivitamin with minerals  1 tablet Oral Daily  .  rosuvastatin  40 mg Oral Daily  . sodium chloride flush  3 mL Intravenous Q12H  . tamsulosin  0.4 mg Oral Daily   Continuous Infusions:    LOS: 0 days    Time spent: [place], MD [facility] Pager [phone]  If 7PM-7AM, please contact night-coverage [email or web address] Password [email or web address] 03/08/2016, 12:44 PM

## 2016-03-08 NOTE — Progress Notes (Signed)
Subjective: Patient has no complaints. Foley draining well. He was seen in the office September 2017 when he continued CIC and an ultrasound showed no hydronephrosis. He tells me he stopped CIC since he was doing well, but I actually told him at the office visit he may need to cath more often. He has a h/o bladder erythema on cysto in 2013 and refused biopsy. His prostate is about 80 g on imaging. He was found to have an atonic bladder on urodynamics.   Objective: Vital signs in last 24 hours: Temp:  [97.8 F (36.6 C)-98.9 F (37.2 C)] 98.6 F (37 C) (11/16 1607) Pulse Rate:  [68-76] 76 (11/16 1607) Resp:  [16-20] 20 (11/16 1607) BP: (98-125)/(48-65) 105/53 (11/16 1638) SpO2:  [99 %-100 %] 100 % (11/16 1607) Weight:  [66.6 kg (146 lb 13.2 oz)] 66.6 kg (146 lb 13.2 oz) (11/16 5093)  Intake/Output from previous day: 11/15 0701 - 11/16 0700 In: 2188.8 [P.O.:840; I.V.:1148.8; IV Piggyback:200] Out: 2425 [Urine:2425]   Intake/Output Summary (Last 24 hours) at 03/08/16 2001 Last data filed at 03/08/16 1828  Gross per 24 hour  Intake              720 ml  Output             2675 ml  Net            -1955 ml    Intake/Output this shift: No intake/output data recorded.  Physical Exam:  NAD A&Ox3 GU - urine clear   Lab Results:  Recent Labs  03/06/16 1847 03/07/16 0652 03/08/16 0405  HGB 11.3* 9.7* 9.7*  HCT 36.7* 32.1* 32.1*   BMET  Recent Labs  03/08/16 0010 03/08/16 0405  NA 141 140  K 5.2* 5.0  CL 109 107  CO2 26 27  GLUCOSE 147* 138*  BUN 48* 47*  CREATININE 2.29* 2.15*  CALCIUM 8.4* 8.4*   No results for input(s): LABPT, INR in the last 72 hours. No results for input(s): LABURIN in the last 72 hours. Results for orders placed or performed during the hospital encounter of 03/06/16  Urine culture     Status: Abnormal   Collection Time: 03/06/16 10:40 PM  Result Value Ref Range Status   Specimen Description URINE, RANDOM  Final   Special Requests NONE   Final   Culture MULTIPLE SPECIES PRESENT, SUGGEST RECOLLECTION (A)  Final   Report Status 03/08/2016 FINAL  Final  Culture, Urine     Status: None   Collection Time: 03/07/16 12:33 PM  Result Value Ref Range Status   Specimen Description URINE, CATHETERIZED  Final   Special Requests NONE  Final   Culture NO GROWTH  Final   Report Status 03/08/2016 FINAL  Final    Studies/Results: US Renal  Result Date: 03/07/2016 CLINICAL DATA:  Acute renal failure EXAM: RENAL / URINARY TRACT ULTRASOUND COMPLETE COMPARISON:  None. FINDINGS: Right Kidney: Length: 12.9 cm. Moderate hydronephrosis with some echogenic debris in the central renal collecting system. No focal renal mass. Left Kidney: Length: 10.8 cm moderate hydronephrosis and proximal ureterectasis. No focal parenchymal lesion. Bladder: Physiologically distended. Somewhat irregular wall thickening along the posterior wall. Prostate 5.8 x 3.8 x 6 cm. IMPRESSION: 1. Moderate bilateral hydronephrosis. 2. Mild prostatic enlargement with irregular asymmetric bladder wall thickening. Consider urologic consultation to exclude neoplasm. Electronically Signed   By: Lucrezia Europe M.D.   On: 03/07/2016 09:01    Assessment/Plan: Bladder wall thickening, bilateral hydronephrosis, Acute on CKD - Cr improving,  urine clear with good UOP.   Atonic bladder - cont foley  BPH - cont tamsulosin and finasteride    LOS: 0 days   [name] [name] 03/08/2016, 7:10 PM
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
## 2016-03-08 NOTE — Progress Notes (Signed)
Pt. Stated he had an IV that infiltrated earlier today in R arm. IV was removed. Upon assessment, RN noted site to be pink and warm to touch. Pt. Stated his had numbness in his R index and middle fingers, and also his wrist. On call text page sent to on call for [facility] to make aware. RN will continue to monitor pt. For changes in condition.

## 2016-03-08 NOTE — Progress Notes (Signed)
Late Entry: Applied Heel Allevyn bilaterally. Order for wounds to be cleansed and dressing changed PRN. Encouraged pt to reconsider prevalon boots to elevated heels, however pt refused. Pillow placed under heels for elevation. Will continue to monitor.

## 2016-03-09 ENCOUNTER — Ambulatory Visit: Payer: Commercial Managed Care - HMO | Admitting: Neurology

## 2016-03-09 DIAGNOSIS — Z95810 Presence of automatic (implantable) cardiac defibrillator: Secondary | ICD-10-CM | POA: Diagnosis not present

## 2016-03-09 DIAGNOSIS — E43 Unspecified severe protein-calorie malnutrition: Secondary | ICD-10-CM

## 2016-03-09 DIAGNOSIS — N170 Acute kidney failure with tubular necrosis: Secondary | ICD-10-CM | POA: Diagnosis not present

## 2016-03-09 DIAGNOSIS — R338 Other retention of urine: Secondary | ICD-10-CM | POA: Diagnosis not present

## 2016-03-09 DIAGNOSIS — N1330 Unspecified hydronephrosis: Secondary | ICD-10-CM | POA: Diagnosis not present

## 2016-03-09 DIAGNOSIS — I5042 Chronic combined systolic (congestive) and diastolic (congestive) heart failure: Secondary | ICD-10-CM | POA: Diagnosis not present

## 2016-03-09 DIAGNOSIS — N183 Chronic kidney disease, stage 3 (moderate): Secondary | ICD-10-CM | POA: Diagnosis not present

## 2016-03-09 DIAGNOSIS — E875 Hyperkalemia: Secondary | ICD-10-CM | POA: Diagnosis not present

## 2016-03-09 DIAGNOSIS — N179 Acute kidney failure, unspecified: Secondary | ICD-10-CM | POA: Diagnosis not present

## 2016-03-09 DIAGNOSIS — N320 Bladder-neck obstruction: Secondary | ICD-10-CM | POA: Diagnosis not present

## 2016-03-09 LAB — BASIC METABOLIC PANEL
Anion gap: 7 (ref 5–15)
BUN: 42 mg/dL — ABNORMAL HIGH (ref 6–20)
CHLORIDE: 106 mmol/L (ref 101–111)
CO2: 27 mmol/L (ref 22–32)
CREATININE: 1.86 mg/dL — AB (ref 0.61–1.24)
Calcium: 8.3 mg/dL — ABNORMAL LOW (ref 8.9–10.3)
GFR, EST AFRICAN AMERICAN: 41 mL/min — AB (ref >60)
GFR, EST NON AFRICAN AMERICAN: 35 mL/min — AB (ref >60)
Glucose, Bld: 131 mg/dL — ABNORMAL HIGH (ref 65–99)
Potassium: 4.4 mmol/L (ref 3.5–5.1)
SODIUM: 140 mmol/L (ref 135–145)

## 2016-03-09 LAB — CBC
HCT: 31.8 % — ABNORMAL LOW (ref 39.0–52.0)
HEMOGLOBIN: 9.5 g/dL — AB (ref 13.0–17.0)
MCH: 25.5 pg — ABNORMAL LOW (ref 26.0–34.0)
MCHC: 29.9 g/dL — AB (ref 30.0–36.0)
MCV: 85.3 fL (ref 78.0–100.0)
PLATELETS: 255 10*3/uL (ref 150–400)
RBC: 3.73 MIL/uL — AB (ref 4.22–5.81)
RDW: 16.4 % — ABNORMAL HIGH (ref 11.5–15.5)
WBC: 11.7 10*3/uL — ABNORMAL HIGH (ref 4.0–10.5)

## 2016-03-09 LAB — GLUCOSE, CAPILLARY
GLUCOSE-CAPILLARY: 98 mg/dL (ref 65–99)
Glucose-Capillary: 121 mg/dL — ABNORMAL HIGH (ref 65–99)
Glucose-Capillary: 246 mg/dL — ABNORMAL HIGH (ref 65–99)
Glucose-Capillary: 227 mg/dL — ABNORMAL HIGH (ref 65–99)

## 2016-03-09 NOTE — Progress Notes (Signed)
  Pt without complaints.   Vitals:   03/08/16 2036 03/09/16 0425  BP: 102/71 110/64  Pulse: 69 76  Resp: 20 20  Temp: 98.4 F (36.9 C) 97.8 F (36.6 C)    Intake/Output Summary (Last 24 hours) at 03/09/16 1210 Last data filed at 03/09/16 0945  Gross per 24 hour  Intake              780 ml  Output             1475 ml  Net             -695 ml   A&Ox3 NAD GU - urine clear  BMET    Component Value Date/Time   NA 140 03/09/2016 0335   NA 140 11/05/2015   K 4.4 03/09/2016 0335   CL 106 03/09/2016 0335   CO2 27 03/09/2016 0335   GLUCOSE 131 (H) 03/09/2016 0335   BUN 42 (H) 03/09/2016 0335   BUN 20 11/05/2015   CREATININE 1.86 (H) 03/09/2016 0335   CREATININE 1.39 (H) 07/08/2015 1502   CALCIUM 8.3 (L) 03/09/2016 0335   GFRNONAA 35 (L) 03/09/2016 0335   GFRAA 41 (L) 03/09/2016 0335    A/P: Bladder wall thickening, bilateral hydronephrosis, Acute on CKD - Cr back to baseline. K normalized. Good UOP.   Atonic bladder - cont foley  BPH - cont tamsulosin and finasteride   I'll sign off and plan to see patient back in about 2 weeks for repeat imaging and cystoscopy.

## 2016-03-09 NOTE — Progress Notes (Signed)
PROGRESS NOTE    [name] [name]  LGX:[id number] DOB: Feb 11, 1946 DOA: 03/06/2016 PCP: [name], MD    Brief Narrative:  Patient is a 70 year old gentleman history of diabetes, CVA, obstructive sleep apnea not on C Pap, coronary artery disease, hypertension, chronic kidney disease stage III, history of AICD who was sent to the ED from cardiologist's office secondary to hyper K anemia as well as acute on chronic kidney disease. Patient had seen his urologist 2 months ago renal ultrasound was done then which was unremarkable. Patient subsequently had a hip fracture and had been at the skilled nursing facility. Patient fell being followed by neurology for constant incontinence, neurogenic bladder and stool incontinence. Renal ultrasound done on admission noted bilateral hydronephrosis. Urology has been consulted.   Assessment & Plan:   Principal Problem:   Hyperkalemia Active Problems:   Bladder outlet obstruction   Automatic implantable cardioverter-defibrillator in situ   UTI (urinary tract infection)   Acute renal failure superimposed on stage 3 chronic kidney disease (HCC)   Diabetic ulcer of left foot associated with type 2 diabetes mellitus (HCC)   Protein-calorie malnutrition, severe   Atrial flutter (HCC)   Coronary artery disease involving coronary bypass graft of native heart without angina pectoris   Chronic combined systolic and diastolic congestive heart failure, NYHA class 3 (HCC)  #1 hyperkalemia Likely secondary to acute on chronic kidney disease secondary to bilateral hydronephrosis/urinary retention. Patient status post calcium gluconate, insulin and glucose in the ED and Kayexalate. Patient on IV fluids and diuretics on hold. Potassium trending down and potassium currently at 4.4 from 5.0 from 5.2 from 6.6 on admission. Patient with good urine output. Continue renal diet. Saline lock IV fluids. Follow.   #2 acute on chronic kidney disease stage III Likely secondary  to post renal azotemia secondary to bladder outlet obstruction as noted on renal ultrasound. Patient on IV fluids and diuretics on hold. Urinalysis positive for UTI. 100 protein. No significant change with renal function. Baseline creatinine 1.3-1.7. Placed a Foley catheter. Patient with good urine output of 2075 mL over the past 24 hours. Renal function trending down and creatinine currently at 1.86. Patient has been seen by urology who recommended continued Foley catheter placement follow renal function. Urology following and appreciate input and recommendations.  #3 bladder outlet obstruction/bilateral hydronephrosis/urinary retention Patient noted to have bilateral hydronephrosis on renal ultrasound. Placed a Foley catheter. Patient with good urine output of 2.075 L over the past 24 hours. Renal function trending down a creatinine currently at 1.86. Continue Proscar. Patient has been seen in consultation by urology recommended leave an indwelling catheter for now until he can be documented that patient's hydronephrosis has resolved and renal function returned to baseline. Patient will also need to follow-up with his urologist, Dr. Junious Silk. Urology also recommended repeat ultrasound within the next few days to reassess hydronephrosis. Appreciate urology input and recommendations.   #4 prob UTI versus bacteriuria Urine cultures with multiple species present. Repeat urine cultures negative. Patient has received 3 days of IV ciprofloxacin. Patient asymptomatic. Will discontinue IV antibiotics.   #5 diabetes mellitus Check a hemoglobin A1c. Last hemoglobin A1c was 12.0 10/27/2015. CBGs at 98-121. Continue sliding scale insulin. Follow for now. Continue home dose Lantus.  #6 ?? atrial flutter Patient status post AICD. EKG this morning with normal sinus rhythm with first-degree AV block.  #7 foot ulcers Wound care consultation pending. Will likely need to go back to the wound care clinic once  discharged.  #8  chronic combined systolic and diastolic heart failure/ischemic cardiomyopathy EF of 15% per 2-D echo 2017 with mild to moderate RV dysfunction function. Due to patient's worsening renal status patient's diuretics on hold. Patient on gentle hydration. Saline lock IV fluids. Cardiology has been consulted for volume assessment and management. Cardiology agree with currently holding diuretics at this time. Monitor closely.  #9 coronary artery disease Status post DES LCX July 2017. Continue aspirin, beta blocker, statin, Plavix. Diuretics on hold. Cardiology has been consulted and agree with holding diuretics at this time.  #10 history of V. tach Continue amiodarone 200 mg daily. TSH of 2.345. LFTs within normal limits.  #11 status post right hip fracture and right hip hemiarthroplasty Patient being followed by neurology in the outpatient setting. PT/OT.  DVT prophylaxis: Lovenox Code Status: Full Family Communication: Updated patient. No family at bedside. Disposition Plan: Home once renal function is back to baseline and hyperkalemia has resolved and recommendations in terms of patient's diuretics per cardiology post discharge.   Consultants:   Urology: Dr. Alinda Money 03/07/2016  Cardiology/heart failure: Dr.Bensimhon 03/08/2016  Procedures:  Renal ultrasound 03/07/2016    Antimicrobials:   IV ciprofloxacin 03/07/2016>>>> 03/09/2016   Subjective: Patient denies CP. No SOB. Patient denies any dysuria. Good urine output.  Objective: Vitals:   03/08/16 1638 03/08/16 2036 03/09/16 0425 03/09/16 1433  BP: (!) 105/53 102/71 110/64 (!) 92/55  Pulse:  69 76 78  Resp:  20 20 18   Temp:  98.4 F (36.9 C) 97.8 F (36.6 C) 98.3 F (36.8 C)  TempSrc:  Oral Oral Oral  SpO2:  100% 100% 100%  Weight:   66.3 kg (146 lb 1.6 oz)   Height:        Intake/Output Summary (Last 24 hours) at 03/09/16 1614 Last data filed at 03/09/16 1300  Gross per 24 hour  Intake               480 ml  Output             1475 ml  Net             -995 ml   Filed Weights   03/07/16 0631 03/08/16 0611 03/09/16 0425  Weight: 64.7 kg (142 lb 10.2 oz) 66.6 kg (146 lb 13.2 oz) 66.3 kg (146 lb 1.6 oz)    Examination:  General exam: Appears calm and comfortable  Respiratory system: Clear to auscultation. Respiratory effort normal. Cardiovascular system: S1 & S2 heard, RRR. 2/6 SEM in LUSB. No JVD, rubs, gallops or clicks. No pedal edema. Gastrointestinal system: Abdomen is nondistended, soft and nontender. No organomegaly or masses felt. Normal bowel sounds heard. Central nervous system: Alert and oriented. No focal neurological deficits. Extremities: Symmetric 5 x 5 power. Skin: No rashes, lesions or ulcers Psychiatry: Judgement and insight appear normal. Mood & affect appropriate.     Data Reviewed: I have personally reviewed following labs and imaging studies  CBC:  Recent Labs Lab 03/06/16 1847 03/07/16 0652 03/08/16 0405 03/09/16 0335  WBC 13.3* 9.6 10.8* 11.7*  HGB 11.3* 9.7* 9.7* 9.5*  HCT 36.7* 32.1* 32.1* 31.8*  MCV 84.6 84.5 84.7 85.3  PLT 347 277 273 158   Basic Metabolic Panel:  Recent Labs Lab 03/07/16 1109 03/07/16 1753 03/08/16 0010 03/08/16 0405 03/09/16 0335  NA 139 139 141 140 140  K 5.2* 5.0 5.2* 5.0 4.4  CL 106 105 109 107 106  CO2 25 28 26 27 27   GLUCOSE 166* 149* 147* 138* 131*  BUN 52* 48* 48* 47* 42*  CREATININE 2.44* 2.34* 2.29* 2.15* 1.86*  CALCIUM 8.5* 8.3* 8.4* 8.4* 8.3*  PHOS  --   --   --  3.9  --    GFR: Estimated Creatinine Clearance: 35.2 mL/min (by C-G formula based on SCr of 1.86 mg/dL (H)). Liver Function Tests:  Recent Labs Lab 03/06/16 1604 03/06/16 1847 03/08/16 0405  AST 26 26  --   ALT 31 33  --   ALKPHOS 171* 170*  --   BILITOT 0.3 0.5  --   PROT 6.9 7.4  --   ALBUMIN 2.4* 2.6* 2.0*   No results for input(s): LIPASE, AMYLASE in the last 168 hours. No results for input(s): AMMONIA in the last 168  hours. Coagulation Profile: No results for input(s): INR, PROTIME in the last 168 hours. Cardiac Enzymes: No results for input(s): CKTOTAL, CKMB, CKMBINDEX, TROPONINI in the last 168 hours. BNP (last 3 results) No results for input(s): PROBNP in the last 8760 hours. HbA1C: No results for input(s): HGBA1C in the last 72 hours. CBG:  Recent Labs Lab 03/08/16 1142 03/08/16 1636 03/08/16 2305 03/09/16 0616 03/09/16 1136  GLUCAP 276* 126* 110* 98 121*   Lipid Profile: No results for input(s): CHOL, HDL, LDLCALC, TRIG, CHOLHDL, LDLDIRECT in the last 72 hours. Thyroid Function Tests: No results for input(s): TSH, T4TOTAL, FREET4, T3FREE, THYROIDAB in the last 72 hours. Anemia Panel: No results for input(s): VITAMINB12, FOLATE, FERRITIN, TIBC, IRON, RETICCTPCT in the last 72 hours. Sepsis Labs: No results for input(s): PROCALCITON, LATICACIDVEN in the last 168 hours.  Recent Results (from the past 240 hour(s))  Urine culture     Status: Abnormal   Collection Time: 03/06/16 10:40 PM  Result Value Ref Range Status   Specimen Description URINE, RANDOM  Final   Special Requests NONE  Final   Culture MULTIPLE SPECIES PRESENT, SUGGEST RECOLLECTION (A)  Final   Report Status 03/08/2016 FINAL  Final  Culture, Urine     Status: None   Collection Time: 03/07/16 12:33 PM  Result Value Ref Range Status   Specimen Description URINE, CATHETERIZED  Final   Special Requests NONE  Final   Culture NO GROWTH  Final   Report Status 03/08/2016 FINAL  Final         Radiology Studies: No results found.      Scheduled Meds: . amiodarone  200 mg Oral BID  . aspirin EC  81 mg Oral Daily  . carvedilol  1.5625 mg Oral BID WC  . ciprofloxacin  400 mg Intravenous Q24H  . clopidogrel  75 mg Oral Daily  . dextromethorphan-guaiFENesin  1 tablet Oral Daily  . enoxaparin (LOVENOX) injection  30 mg Subcutaneous Daily  . feeding supplement (PRO-STAT SUGAR FREE 64)  30 mL Oral BID  . finasteride   5 mg Oral Daily  . insulin aspart  0-9 Units Subcutaneous TID WC  . insulin glargine  20 Units Subcutaneous QHS  . mirtazapine  15 mg Oral QHS  . multivitamin with minerals  1 tablet Oral Daily  . rosuvastatin  40 mg Oral Daily  . sodium chloride flush  3 mL Intravenous Q12H  . tamsulosin  0.4 mg Oral Daily   Continuous Infusions:    LOS: 0 days    Time spent: [place], MD [facility] Pager [phone]  If 7PM-7AM, please contact night-coverage [email or web address] Password [email or web address] 03/09/2016, 4:14 PM

## 2016-03-09 NOTE — Consult Note (Signed)
   [facility] Inpatient Consult   03/09/2016  [name] [name] Jan 31, 1946 [id number]  Patient is currently active with [facility] for chronic disease management services.  Patient has been engaged by a [employer] and CSW.  Met with the patient and [name] at the bedside to check and update on phone numbers and contacts.  Wife states the phone numbers are the same.  Our community based plan of care has focused on disease management and community resource support.  Patient will receive a post discharge transition of care call and will be evaluated for monthly home visits for assessments and disease process education. Has [facility] for home health for PT and OT.  Wife states patient fell at skilled facility and now has nerve damage in his feet and cannot walk.  They are in need of a ramp to be able to get him in and out of the house, which has become very difficult since his fall.  Made Inpatient Case Manager aware that [facility] following in the morning progression meeting. Of note, [facility] Care Management services does not replace or interfere with any services that are needed or arranged by inpatient case management or social work.  For additional questions or referrals please contact:  Natividad Brood, RN BSN [facility] Liaison  [phone] business mobile phone Toll free office [phone]

## 2016-03-09 NOTE — Progress Notes (Signed)
Advanced Heart Failure Rounding Note   Referring Physician: Dr Grandville Silos PCP: Clover Mealy .  HF MD: Dr Haroldine Laws  EP: Dr Caryl Comes  Reason for Consultation: Heart Failure   HPI:    Admitted with hyperkalemia. Received calcium gluconate, insulin and D50. Diuretics held and received IV fluids. On admit K 6.5, creatinine 2.48, WBC 13.3, albumin 2.6 and Hgb 11.3.  Urology consulted for urinary retention and bilateral hydronephrosis. Foley catheter was placed.   Feeling OK this am. Denies lightheadedness, dizziness, or SOB. No swelling.   Objective:    Vital Signs:   Temp:  [97.8 F (36.6 C)-98.6 F (37 C)] 97.8 F (36.6 C) (11/17 0425) Pulse Rate:  [69-76] 76 (11/17 0425) Resp:  [18-20] 20 (11/17 0425) BP: (98-112)/(51-71) 110/64 (11/17 0425) SpO2:  [99 %-100 %] 100 % (11/17 0425) Weight:  [146 lb 1.6 oz (66.3 kg)] 146 lb 1.6 oz (66.3 kg) (11/17 0425) Last BM Date: 03/08/16  Weight change: Filed Weights   03/07/16 0631 03/08/16 0611 03/09/16 0425  Weight: 142 lb 10.2 oz (64.7 kg) 146 lb 13.2 oz (66.6 kg) 146 lb 1.6 oz (66.3 kg)    Intake/Output:   Intake/Output Summary (Last 24 hours) at 03/09/16 0848 Last data filed at 03/09/16 0643  Gross per 24 hour  Intake              960 ml  Output             2075 ml  Net            -1115 ml     Physical Exam: General:  Elderly and Chronically ill appearing. Thin. NAD  HEENT: Normal Neck: supple. JVP flat . Carotids 2+ bilat; no bruits. No thyromegaly or nodule noted.  Cor: PMI nondisplaced. RRR No rubs, gallops or 2/6 SEM at LUSB/RUSB Lungs: CTAB, normal effort  Abdomen: soft, NT, ND, no HSM. No bruits or masses. +BS  Extremities: no cyanosis, clubbing, rash, edema Neuro: alert & orientedx3, cranial nerves grossly intact. moves all 4 extremities w/o difficulty. Affect pleasant SKin: R, L heel, sacrum foam dressing in place GU: foley   Telemetry: Reviewed, NSR 70s  Labs: Basic Metabolic Panel:  Recent Labs Lab  03/07/16 1109 03/07/16 1753 03/08/16 0010 03/08/16 0405 03/09/16 0335  NA 139 139 141 140 140  K 5.2* 5.0 5.2* 5.0 4.4  CL 106 105 109 107 106  CO2 25 28 26 27 27   GLUCOSE 166* 149* 147* 138* 131*  BUN 52* 48* 48* 47* 42*  CREATININE 2.44* 2.34* 2.29* 2.15* 1.86*  CALCIUM 8.5* 8.3* 8.4* 8.4* 8.3*  PHOS  --   --   --  3.9  --     Liver Function Tests:  Recent Labs Lab 03/06/16 1604 03/06/16 1847 03/08/16 0405  AST 26 26  --   ALT 31 33  --   ALKPHOS 171* 170*  --   BILITOT 0.3 0.5  --   PROT 6.9 7.4  --   ALBUMIN 2.4* 2.6* 2.0*   No results for input(s): LIPASE, AMYLASE in the last 168 hours. No results for input(s): AMMONIA in the last 168 hours.  CBC:  Recent Labs Lab 03/06/16 1847 03/07/16 0652 03/08/16 0405 03/09/16 0335  WBC 13.3* 9.6 10.8* 11.7*  HGB 11.3* 9.7* 9.7* 9.5*  HCT 36.7* 32.1* 32.1* 31.8*  MCV 84.6 84.5 84.7 85.3  PLT 347 277 273 255    Cardiac Enzymes: No results for input(s): CKTOTAL, CKMB, CKMBINDEX, TROPONINI in the last  168 hours.  BNP: BNP (last 3 results)  Recent Labs  04/11/15 0400 07/26/15 1707 10/27/15 1124  BNP 2,568.2* 2,061.3* 1,318.0*    ProBNP (last 3 results) No results for input(s): PROBNP in the last 8760 hours.   CBG:  Recent Labs Lab 03/08/16 0614 03/08/16 1142 03/08/16 1636 03/08/16 2305 03/09/16 0616  GLUCAP 114* 276* 126* 110* 98    Coagulation Studies: No results for input(s): LABPROT, INR in the last 72 hours.  Other results: EKG:   Imaging: US Renal  Result Date: 03/07/2016 CLINICAL DATA:  Acute renal failure EXAM: RENAL / URINARY TRACT ULTRASOUND COMPLETE COMPARISON:  None. FINDINGS: Right Kidney: Length: 12.9 cm. Moderate hydronephrosis with some echogenic debris in the central renal collecting system. No focal renal mass. Left Kidney: Length: 10.8 cm moderate hydronephrosis and proximal ureterectasis. No focal parenchymal lesion. Bladder: Physiologically distended. Somewhat irregular  wall thickening along the posterior wall. Prostate 5.8 x 3.8 x 6 cm. IMPRESSION: 1. Moderate bilateral hydronephrosis. 2. Mild prostatic enlargement with irregular asymmetric bladder wall thickening. Consider urologic consultation to exclude neoplasm. Electronically Signed   By: Lucrezia Europe M.D.   On: 03/07/2016 09:01     Medications:     Current Medications: . amiodarone  200 mg Oral BID  . aspirin EC  81 mg Oral Daily  . carvedilol  1.5625 mg Oral BID WC  . ciprofloxacin  400 mg Intravenous Q24H  . clopidogrel  75 mg Oral Daily  . dextromethorphan-guaiFENesin  1 tablet Oral Daily  . enoxaparin (LOVENOX) injection  30 mg Subcutaneous Daily  . feeding supplement (PRO-STAT SUGAR FREE 64)  30 mL Oral BID  . finasteride  5 mg Oral Daily  . insulin aspart  0-9 Units Subcutaneous TID WC  . insulin glargine  20 Units Subcutaneous QHS  . mirtazapine  15 mg Oral QHS  . multivitamin with minerals  1 tablet Oral Daily  . rosuvastatin  40 mg Oral Daily  . sodium chloride flush  3 mL Intravenous Q12H  . tamsulosin  0.4 mg Oral Daily    Infusions:    Assessment:  1. Hyperkalemia 2. Urinary Retention/bilater hydronephrosis 3. ARFI/CKD 4.Chronic Combined Systolic/Diastolic HF  --ECHO- 12/27/73 EF 15-20%. Diffuse HK. Akinesis apical myocardium. Grade II DD. 5. CAD- S/P DES LCX 10/2015  6. DM 7. H/O VT 8/ H/O R hip fracture- with R Hip Hemiarthroplasty 9/ Immobility   Plan/Discussion:    [name] [name] is well known to the HF team and was sent to [facility] ED 11/14 with hyperkalemia which has improved with hydration.    From HF perspective he remains stable.  Weight up 4 lbs from admit but stable overnight. Continue to hold diuretics for now. Continue low dose bb.   No dig/spir/arb with hyperkalemia/ARF.   Creatinine trending down 2.5>2.1>1.86. K 4.4 this am.   Denies CP. Continue plavix, bb, statin, and aspirin.   Maintaining SR. Continue amio 200 mg BID.    Length of Stay: 0  Shirley Friar PA-C  03/09/2016, 8:48 AM  Advanced Heart Failure Team Pager [phone] (M-F; 7a - 4p)  Please contact [facility] for night-coverage after hours (4p -7a ) and weekends on [email or web address]  Patient seen and examined with [name] Kilts, PA-C. We discussed all aspects of the encounter. I agree with the assessment and plan as stated above.   Much improved. Volume status looks good. Renal function now back to baseline. Would stop spiro and digoxin completely. Start torsemide 20 every other day.  [place] for d/c from our standpoint.   [name] [name], Daniel,MD 4:40 PM

## 2016-03-10 DIAGNOSIS — Z95810 Presence of automatic (implantable) cardiac defibrillator: Secondary | ICD-10-CM | POA: Diagnosis not present

## 2016-03-10 DIAGNOSIS — E875 Hyperkalemia: Secondary | ICD-10-CM | POA: Diagnosis not present

## 2016-03-10 DIAGNOSIS — N320 Bladder-neck obstruction: Secondary | ICD-10-CM | POA: Diagnosis not present

## 2016-03-10 DIAGNOSIS — I5042 Chronic combined systolic (congestive) and diastolic (congestive) heart failure: Secondary | ICD-10-CM | POA: Diagnosis not present

## 2016-03-10 DIAGNOSIS — N179 Acute kidney failure, unspecified: Secondary | ICD-10-CM | POA: Diagnosis not present

## 2016-03-10 LAB — GLUCOSE, CAPILLARY
GLUCOSE-CAPILLARY: 103 mg/dL — AB (ref 65–99)
GLUCOSE-CAPILLARY: 216 mg/dL — AB (ref 65–99)

## 2016-03-10 LAB — BASIC METABOLIC PANEL
ANION GAP: 7 (ref 5–15)
BUN: 40 mg/dL — ABNORMAL HIGH (ref 6–20)
CO2: 26 mmol/L (ref 22–32)
Calcium: 8.5 mg/dL — ABNORMAL LOW (ref 8.9–10.3)
Chloride: 109 mmol/L (ref 101–111)
Creatinine, Ser: 1.58 mg/dL — ABNORMAL HIGH (ref 0.61–1.24)
GFR, EST AFRICAN AMERICAN: 50 mL/min — AB (ref >60)
GFR, EST NON AFRICAN AMERICAN: 43 mL/min — AB (ref >60)
GLUCOSE: 107 mg/dL — AB (ref 65–99)
POTASSIUM: 4.3 mmol/L (ref 3.5–5.1)
Sodium: 142 mmol/L (ref 135–145)

## 2016-03-10 MED ORDER — TORSEMIDE 20 MG PO TABS: 20.0000 mg | ORAL_TABLET | ORAL | 0 refills | Status: DC

## 2016-03-10 MED ORDER — PRO-STAT SUGAR FREE PO LIQD: 30.0000 mL | Freq: Two times a day (BID) | ORAL | 0 refills | Status: DC

## 2016-03-10 NOTE — Discharge Summary (Signed)
Pt got discharged, discharge instructions provided and patient showed understanding to it, IV taken out,Telemonitor DC,pt left unit in wheelchair with all of the belongings with daughter

## 2016-03-10 NOTE — Discharge Summary (Signed)
Physician Discharge Summary  [name] [name] ZOX:[id number] DOB: Jun 21, 1945 DOA: 03/06/2016  PCP: [name], MD  Admit date: 03/06/2016 Discharge date: 03/10/2016  Time spent: 65 minutes  Recommendations for Outpatient Follow-up:  1. Follow-up with [name], MD in 2 weeks. On follow-up patient in need a basic metabolic profile done to follow-up on electrolytes and renal function. 2. Follow-up with Dr.Bensimhon, cardiology in 2-3 weeks. 3. Follow-up with her last urology/Dr. Junious Silk on 03/29/2016 for further evaluation of bilateral hydronephrosis and possible voiding trial in further evaluation of kidney.   Discharge Diagnoses:  Principal Problem:   Hyperkalemia Active Problems:   Bladder outlet obstruction   Automatic implantable cardioverter-defibrillator in situ   UTI (urinary tract infection)   Acute renal failure superimposed on stage 3 chronic kidney disease (HCC)   Diabetic ulcer of left foot associated with type 2 diabetes mellitus (HCC)   Protein-calorie malnutrition, severe   Atrial flutter (HCC)   Coronary artery disease involving coronary bypass graft of native heart without angina pectoris   Chronic combined systolic and diastolic congestive heart failure, NYHA class 3 ([place])   Discharge Condition: Stable and improved  Diet recommendation: Heart healthy  Filed Weights   03/08/16 0611 03/09/16 0425 03/10/16 0629  Weight: 66.6 kg (146 lb 13.2 oz) 66.3 kg (146 lb 1.6 oz) 66.9 kg (147 lb 6.4 oz)    History of present illness:  Per Dr Caprice Renshaw is a 70 y.o. male with medical history significant of DM, CVA, OSA not on CPAP, CAD, HTN, CKD, and AICD presented as cardiology referral due to hyperkalemia.  He went to see Dr. Haroldine Laws on day of admission for a regular checkup.  Had labs.  They called back and said his potassium was way too high and he needed to come in.  Reported eating and drinking well.  Hardening of bladder, has to self-cath  intermittently.  Saw urology about 2 months ago, said kidneys looking really good on ultrasound.  After that visit, he broke his hip in August.  Was at [facility] and was doing well with rehab but then fell out of bed at nursing home, unable to walk since.  Now sees neurology for this.  Now with constant incontinence, neurogenic bladder and stool incontinence.    ED Course: Per Dr. Laverta Baltimore: Patient presented to emergency department for evaluation of hyperkalemia. The patient does have QRS widening on EKG but this appearred to be old. The plan  Was to begin calcium gluconate anyway and shift potassium with insulin and D50. Patient was here with family stable and otherwise well appearing. Obtain urinalysis with family saying that his urine has smelled bad. No other evidence of UTI.  Discussed patient's case with hospitalist, Dr. Lorin Mercy. Recommended admission to obs, tele bed.   Hospital Course:  #1 hyperkalemia Likely secondary to acute on chronic kidney disease secondary to bilateral hydronephrosis/urinary retention. Patient status post calcium gluconate, insulin and glucose in the ED and Kayexalate. Patient was initially placed on IV fluids and diuretics were held. Potassium levels trended down and hyperkalemia had resolved by day of discharge.   #2 acute on chronic kidney disease stage III Likely secondary to post renal azotemia secondary to bladder outlet obstruction as noted on renal ultrasound. Patient was initially placed on IV fluids on admission and diuretics held. Urinalysis which was done was on some for UTI and patient was placed empirically on IV ciprofloxacin. Urine cultures were obtained and patient was monitored.   Baseline creatinine 1.3-1.7. Patient  was noted to have good urine output during the hospitalization and patient's renal function trended down and improved on a daily basis such that by day of discharge patient's creatinine was down to 1.58 from 2.62 on admission. Patient will follow-up  with urology in outpatient setting. Patient be discharged home with a Foley catheter.   #3 bladder outlet obstruction/bilateral hydronephrosis/urinary retention Patient noted to have bilateral hydronephrosis on renal ultrasound. Placed a Foley catheter. Patient with good urine output of after Foley catheter was placed and patient was -1.871 L during this hospitalization.  Renal function trended down on a daily basis during this hospitalization from 2.62 on admission down to 1.58 by day of discharge. Patient was maintained on home regimen of Proscar and Flomax. Urology consultation was obtained.  Patient has been seen in consultation by urology recommended leave an indwelling catheter for now until he can be documented that patient's hydronephrosis has resolved and renal function returned to baseline. Patient will also need to follow-up with his urologist, Dr. Junious Silk in the outpatient setting. Urology also recommended repeat ultrasound within the next few days to reassess hydronephrosis.   #4 prob UTI versus bacteriuria likely secondary to colonization Urine cultures with multiple species present. Repeat urine cultures negative. Patient received 3 days of IV ciprofloxacin. Patient remained asymptomatic.   #5 diabetes mellitus Last hemoglobin A1c was 12.0 10/27/2015. Patient was maintained on home regimen of Lantus during the hospitalization as well as sliding scale insulin. Outpatient follow-up.   #6 ?? atrial flutter Patient status post AICD. EKG this morning with normal sinus rhythm with first-degree AV block.  #7 foot ulcers Will likely need to go back to the wound care clinic once discharged.  #8 chronic combined systolic and diastolic heart failure/ischemic cardiomyopathy EF of 15% per 2-D echo 2017 with mild to moderate RV dysfunction function. Due to patient's worsening renal status patient's diuretics were held throughout the hospitalization. Cardiology was consulted for volume  assessment and management. Cardiology agreed with currently holding diuretics at this time. Cardiology also recommended discontinuation of spironolactone and digoxin on discharge and placing patient on torsemide 20 mg every other day. Patient will follow-up with cardiology in 2-3 weeks.  #9 coronary artery disease Status post DES LCX July 2017. Continued on home regimen of aspirin, beta blocker, statin, Plavix. Diuretics on hold. Cardiology was consulted and was in agreement with holding patient's diuretics. As patient improved clinically was recommended to discontinue patient's spironolactone and digoxin completely and to discharge patient home on torsemide 20 mg every other day. Patient will follow-up with cardiology in outpatient setting.  #10 history of V. tach Continued on home regimen of amiodarone 200 mg daily. TSH of 2.345. LFTs within normal limits.  #11 status post right hip fracture and right hip hemiarthroplasty Patient being followed by neurology in the outpatient setting. PT/OT.   Procedures:  Renal ultrasound 03/07/2016    Consultations:  Urology: Dr. Alinda Money 03/07/2016  Cardiology/heart failure: Dr.Bensimhon 03/08/2016  Discharge Exam: Vitals:   03/10/16 0900 03/10/16 1200  BP: 97/64 101/64  Pulse: 76 74  Resp:  20  Temp:  98 F (36.7 C)    General: NAD Cardiovascular: RRR with 2/6 SEM LUSB/RUSB Respiratory: CTAB  Discharge Instructions   Discharge Instructions    Diet - low sodium heart healthy    Complete by:  As directed    Increase activity slowly    Complete by:  As directed      Current Discharge Medication List    START taking  these medications   Details  Amino Acids-Protein Hydrolys (FEEDING SUPPLEMENT, PRO-STAT SUGAR FREE 64,) LIQD Take 30 mLs by mouth 2 (two) times daily. Qty: 900 mL, Refills: 0      CONTINUE these medications which have CHANGED   Details  torsemide (DEMADEX) 20 MG tablet Take 1 tablet (20 mg total) by mouth  every other day. Start on Monday 03/12/2016 Qty: 30 tablet, Refills: 0      CONTINUE these medications which have NOT CHANGED   Details  amiodarone (PACERONE) 200 MG tablet Take 200 mg by mouth 2 (two) times daily.     Ascorbic Acid (VITAMIN C PO) Take 1 tablet by mouth 2 (two) times daily.    aspirin EC 81 MG tablet Take 81 mg by mouth daily.    carvedilol (COREG) 3.125 MG tablet Take 1.5625 mg by mouth 2 (two) times daily with a meal.    clopidogrel (PLAVIX) 75 MG tablet Take 1 tablet (75 mg total) by mouth daily. Qty: 30 tablet, Refills: 0    dextromethorphan-guaiFENesin (MUCINEX DM) 30-600 MG 12hr tablet Take 1 tablet by mouth daily. Qty: 30 tablet, Refills: 0    finasteride (PROSCAR) 5 MG tablet Take 5 mg by mouth daily.     HYDROcodone-acetaminophen (NORCO/VICODIN) 5-325 MG tablet Take 1-2 tablets by mouth every 6 (six) hours as needed for moderate pain. Qty: 30 tablet, Refills: 0    insulin aspart (NOVOLOG FLEXPEN) 100 UNIT/ML FlexPen Inject 5-15 Units into the skin 3 (three) times daily with meals. Per sliding scale    Insulin Glargine (LANTUS SOLOSTAR) 100 UNIT/ML Solostar Pen Inject 20 Units into the skin at bedtime.    MAGNESIUM PO Take 1 tablet by mouth every other day.    mirtazapine (REMERON) 15 MG tablet TAKE 1 TABLET(15 MG) BY MOUTH AT BEDTIME Qty: 30 tablet, Refills: 2    multivitamin-iron-minerals-folic acid (THERAPEUTIC-M) TABS tablet Take 1 tablet by mouth daily.    Probiotic Product (PROBIOTIC PO) Take 1 capsule by mouth daily.    rosuvastatin (CRESTOR) 40 MG tablet Take 40 mg by mouth daily.    tamsulosin (FLOMAX) 0.4 MG CAPS capsule Take 1 capsule (0.4 mg total) by mouth daily. Qty: 30 capsule, Refills: 6    vitamin A 10000 UNIT capsule Take 10,000 Units by mouth daily.    zinc sulfate 220 (50 Zn) MG capsule Take 220 mg by mouth daily.      STOP taking these medications     digoxin (LANOXIN) 0.125 MG tablet      spironolactone (ALDACTONE) 25  MG tablet        Allergies  Allergen Reactions  . Sulfa Antibiotics Rash   Follow-up Information    [name], MD Follow up.   Specialty:  Family Medicine Contact information: [place] [address] [phone]        [facility] Go on 03/29/2016.   Why:  @10 :00am with NP Contact information: [place] [address] [phone]        Glori Bickers, MD. Schedule an appointment as soon as possible for a visit in 3 week(s).   Specialty:  Cardiology Why:  f/u in 2-3 weeks. Contact information: [address] [place] [address] [phone]            The results of significant diagnostics from this hospitalization (including imaging, microbiology, ancillary and laboratory) are listed below for reference.    Significant Diagnostic Studies: US  Renal  Result Date: 03/07/2016 CLINICAL DATA:  Acute renal failure EXAM: RENAL / URINARY TRACT ULTRASOUND COMPLETE COMPARISON:  None. FINDINGS: Right Kidney: Length: 12.9 cm. Moderate hydronephrosis with some echogenic debris in the central renal collecting system. No focal renal mass. Left Kidney: Length: 10.8 cm moderate hydronephrosis and proximal ureterectasis. No focal parenchymal lesion. Bladder: Physiologically distended. Somewhat irregular wall thickening along the posterior wall. Prostate 5.8 x 3.8 x 6 cm. IMPRESSION: 1. Moderate bilateral hydronephrosis. 2. Mild prostatic enlargement with irregular asymmetric bladder wall thickening. Consider urologic consultation to exclude neoplasm. Electronically Signed   By: Lucrezia Europe M.D.   On: 03/07/2016 09:01    Microbiology: Recent Results (from the past 240 hour(s))  Urine culture     Status: Abnormal   Collection Time: 03/06/16 10:40 PM  Result Value Ref Range Status   Specimen Description URINE, RANDOM  Final   Special Requests NONE  Final   Culture MULTIPLE SPECIES PRESENT,  SUGGEST RECOLLECTION (A)  Final   Report Status 03/08/2016 FINAL  Final  Culture, Urine     Status: None   Collection Time: 03/07/16 12:33 PM  Result Value Ref Range Status   Specimen Description URINE, CATHETERIZED  Final   Special Requests NONE  Final   Culture NO GROWTH  Final   Report Status 03/08/2016 FINAL  Final     Labs: Basic Metabolic Panel:  Recent Labs Lab 03/07/16 1753 03/08/16 0010 03/08/16 0405 03/09/16 0335 03/10/16 0528  NA 139 141 140 140 142  K 5.0 5.2* 5.0 4.4 4.3  CL 105 109 107 106 109  CO2 28 26 27 27 26   GLUCOSE 149* 147* 138* 131* 107*  BUN 48* 48* 47* 42* 40*  CREATININE 2.34* 2.29* 2.15* 1.86* 1.58*  CALCIUM 8.3* 8.4* 8.4* 8.3* 8.5*  PHOS  --   --  3.9  --   --    Liver Function Tests:  Recent Labs Lab 03/06/16 1604 03/06/16 1847 03/08/16 0405  AST 26 26  --   ALT 31 33  --   ALKPHOS 171* 170*  --   BILITOT 0.3 0.5  --   PROT 6.9 7.4  --   ALBUMIN 2.4* 2.6* 2.0*   No results for input(s): LIPASE, AMYLASE in the last 168 hours. No results for input(s): AMMONIA in the last 168 hours. CBC:  Recent Labs Lab 03/06/16 1847 03/07/16 0652 03/08/16 0405 03/09/16 0335  WBC 13.3* 9.6 10.8* 11.7*  HGB 11.3* 9.7* 9.7* 9.5*  HCT 36.7* 32.1* 32.1* 31.8*  MCV 84.6 84.5 84.7 85.3  PLT 347 277 273 255   Cardiac Enzymes: No results for input(s): CKTOTAL, CKMB, CKMBINDEX, TROPONINI in the last 168 hours. BNP: BNP (last 3 results)  Recent Labs  04/11/15 0400 07/26/15 1707 10/27/15 1124  BNP 2,568.2* 2,061.3* 1,318.0*    ProBNP (last 3 results) No results for input(s): PROBNP in the last 8760 hours.  CBG:  Recent Labs Lab 03/09/16 1136 03/09/16 1623 03/09/16 2045 03/10/16 0620 03/10/16 1155  GLUCAP 121* 246* 227* 103* 216*       Signed:  THOMPSON,DANIEL MD.  [facility] 03/10/2016, 12:51 PM

## 2016-03-12 ENCOUNTER — Telehealth: Payer: Self-pay

## 2016-03-12 ENCOUNTER — Other Ambulatory Visit: Payer: Self-pay

## 2016-03-12 NOTE — Telephone Encounter (Signed)
D/C: 03/10/16 To: home  Spoke with pt and he is doing well. Catheter is in place and pt states he is having no issues. He did not want to schedule appt at this time as his daughter has to drive him, so he will have her call to schedule.     Transition Care Management Follow-up Telephone Call  How have you been since you were released from the hospital? Good, cath still in place   Do you understand why you were in the hospital? yes   Do you understand the discharge instrcutions? yes  Items Reviewed:  Medications reviewed: yes  Allergies reviewed: yes  Dietary changes reviewed: yes  Referrals reviewed: yes   Functional Questionnaire:   Activities of Daily Living (ADLs):   He states they are independent in the following: ambulation, bathing and hygiene, feeding, continence, grooming, toileting and dressing States they require assistance with the following: none   Any transportation issues/concerns?: yes, has to have daughter drive him    Any patient concerns? no   Confirmed importance and date/time of follow-up visits scheduled: yes   Confirmed with patient if condition begins to worsen call PCP or go to the ER.  Patient was given the Call-a-Nurse line [phone]: yes

## 2016-03-12 NOTE — Patient Outreach (Signed)
[facility]) Care Management  03/12/16  [name] [name] [birth year] [id number]  Attempted to reach patient without success for transition of care. Phone rang multiple times with no answer and no option to leave a voicemail.  [name], RN, BSN, [place] Management Coordinator [phone]

## 2016-03-13 ENCOUNTER — Encounter: Payer: Commercial Managed Care - HMO | Admitting: Neurology

## 2016-03-13 LAB — HEMOGLOBIN A1C
Hgb A1c MFr Bld: 8 % — ABNORMAL HIGH (ref 4.8–5.6)
Mean Plasma Glucose: 183 mg/dL

## 2016-03-14 ENCOUNTER — Ambulatory Visit
Admission: RE | Admit: 2016-03-14 | Discharge: 2016-03-14 | Disposition: A | Discharge status: Home / Self Care | Payer: Commercial Managed Care - HMO | Source: Ambulatory Visit | Attending: Neurology | Admitting: Neurology

## 2016-03-14 ENCOUNTER — Other Ambulatory Visit: Payer: Commercial Managed Care - HMO

## 2016-03-14 ENCOUNTER — Other Ambulatory Visit: Payer: Self-pay

## 2016-03-14 DIAGNOSIS — M21372 Foot drop, left foot: Secondary | ICD-10-CM

## 2016-03-14 DIAGNOSIS — G8311 Monoplegia of lower limb affecting right dominant side: Secondary | ICD-10-CM

## 2016-03-14 DIAGNOSIS — R296 Repeated falls: Secondary | ICD-10-CM

## 2016-03-14 DIAGNOSIS — R159 Full incontinence of feces: Secondary | ICD-10-CM

## 2016-03-14 DIAGNOSIS — M5124 Other intervertebral disc displacement, thoracic region: Secondary | ICD-10-CM | POA: Diagnosis not present

## 2016-03-14 DIAGNOSIS — R32 Unspecified urinary incontinence: Secondary | ICD-10-CM

## 2016-03-14 DIAGNOSIS — M48061 Spinal stenosis, lumbar region without neurogenic claudication: Secondary | ICD-10-CM | POA: Diagnosis not present

## 2016-03-14 NOTE — Patient Outreach (Signed)
[facility]) Care Management  03/14/16  [name] [name] 06-08-1945 [id number]  Initial outreach completed with patient. Patient identification verified. RNCM educated patient regarding [place] Management program and patient is receptive to outreach.  Patient reports that he has been doing ok since his discharge. He currently denies any pain or concerns. He reports that while he was in the hospital his potassium had gone up really high and they were able to get it back down before he returned home. He reports that it was less than 5 at discharge. He stated that he also has heart failure.  Patient reports that prior to this hospitalization, he had been at [facility] for rehabilitation following a fall and breaking his hip. He reports that he had his hip replaced and did well until he fell out of the bed while he was there. He stated that he has had issues with pain in his right knee since then and that his doctors are trying to determine if his pain is related to nerve damage. Patient reported that he had a CT scan today of his back and he is hoping that will give him some answers. (Per records review, he also has an appointment for an EMG of bilateral legs next week). He reported that he is unable to walk on his right leg or put any pressure on it due to increased pain. He reported that he is getting around his home in a wheelchair and is able to self-propel and stays primarily in one part of the home. Patient reports he has 2 steps to enter the home with no rail, but reports he always has someone there to help him get into and out of the home. Patient stated that he lives at home with his wife who assists him, as well as a having a daughter that comes to help him with bathing and takes him to all of his appointments. He stated that his daughter assists with set up and he is able to bathe himself. He reports that his daughter has to assist him in and out of the car as well as he cannot  do it alone at present. He stated that she works in [place] 3 days a week and works from home in [place] on Tuesdays and Thursdays. He reported that he schedules his appointments around her schedule so that she can take him.  Patient stated that he saw Dr. Haroldine Laws yesterday and has an appointment scheduled to see his PCP, Dr. [name] on 03/27/2016. He reported that he does have a set or working scales at home, but is unable to weigh himself due to not being able to stand on the scales at present. RNCM provided education about other signs and symptoms of fluid to monitor for while he is unable to weigh and patient was able to verbalize to look for swelling in his hands, feet and ankles. RNCM educated patient about shortness of breath and a dry hacky cough, as well as difficulty breathing while laying down. Patient reports he currently does not have any problems sleeping and only sleeps on one pillow. Patient currently denies any swelling or shortness of breath. Patient stated that Dr. Haroldine Laws told him that if he sees any symptoms of fluid building up to call him. He reported that prior to this admission, he had home health through [facility], but they have not yet resumed services. Patient stated that he does not have a living will of health care power of attorney. He currently declines  information, stating that he has received all the information and paperwork from [place], but he has not yet had a chance to look over it. Patient reports that he has diabetes. Stated that he is checking his blood sugar 4 times per day. Stated that he normally takes it before meals and before going to bed. Stated that his last reading today was 153. He stated that he believes his last A1C was around 8.1. He stated that he is on sliding scale insulin depending on what his blood sugar is. Patient currently has no needs at present. Advised RNCM will contact patient again next week for follow up and patient is agreeable. Encouraged  patient that if he has not heard from [facility] by Friday to call them to make sure they are aware that he is at home so that they can resume services.  Plan: RNCM to outreach to patient for continued transition of care. Will also follow up to ensure [facility] has resumed home health services.  [place] R. Maronda Caison, RN, BSN, [place] Management Coordinator [phone]

## 2016-03-16 ENCOUNTER — Other Ambulatory Visit: Payer: Self-pay | Admitting: Internal Medicine

## 2016-03-19 NOTE — Telephone Encounter (Signed)
Appt scheduled for 03/27/16.

## 2016-03-20 ENCOUNTER — Ambulatory Visit (INDEPENDENT_AMBULATORY_CARE_PROVIDER_SITE_OTHER): Payer: Commercial Managed Care - HMO | Admitting: Neurology

## 2016-03-20 DIAGNOSIS — M21372 Foot drop, left foot: Secondary | ICD-10-CM

## 2016-03-20 DIAGNOSIS — G8311 Monoplegia of lower limb affecting right dominant side: Secondary | ICD-10-CM

## 2016-03-20 DIAGNOSIS — G5721 Lesion of femoral nerve, right lower limb: Secondary | ICD-10-CM

## 2016-03-20 DIAGNOSIS — G629 Polyneuropathy, unspecified: Secondary | ICD-10-CM

## 2016-03-20 DIAGNOSIS — M5417 Radiculopathy, lumbosacral region: Secondary | ICD-10-CM

## 2016-03-21 ENCOUNTER — Other Ambulatory Visit: Payer: Self-pay | Admitting: Family Medicine

## 2016-03-21 NOTE — Telephone Encounter (Signed)
Dr. [name] Patient.

## 2016-03-21 NOTE — Procedures (Signed)
[facility]  [place], [place]  [place], [place] [address] Tel: [phone] Fax:  [phone] Test Date:  03/20/2016  Patient: [name] [name] DOB: Jan 05, 1946 Physician: Narda Amber, DO  Sex: Male Height: 6\' 2"  Ref Phys: Narda Amber, DO  ID#: [id number] Temp: 32.0C Technician: [name]. Dean   Patient Complaints: This is a 70 year old gentleman with history of left foot drop referred for evaluation of bilateral leg weakness, worse on the right after sustaining a femoral neck fracture.  NCV & EMG Findings: Extensive electrodiagnostic testing of the right lower extremity and additional studies on the left shows:  1. Bilateral sural and superficial peroneal sensory responses are absent. 2. Bilateral peroneal and tibial motor responses are essentially absent. Right femoral motor nerve shows asymmetrically and severely reduced amplitude as well as delayed latency. Left femoral motor responses within normal limits. 3. Bilateral tibial H reflex studies are prolonged. 4. Needle electrode examination shows no motor unit recruitment in bilateral tibialis anterior, right rectus femoris  Impression: This is a complex study. 1. Chronic sensorimotor polyneuropathy, axon loss in type, affecting the lower extremities. Overall, these findings are very severe in degree electrically. Superimposed chronic multilevel intraspinal canal lesion affecting bilateral L2-L5 nerve roots/segments cannot be excluded. 2. Superimposed right femoral mononeuropathy, non-localizable. 3. There is no evidence of a diffuse myopathy.   ___________________________ Narda Amber, DO    Nerve Conduction Studies Anti Sensory Summary Table   Site NR Peak (ms) Norm Peak (ms) P-T Amp (V) Norm P-T Amp  Left Sup Peroneal Anti Sensory (Ant Lat Mall)  12 cm NR  <4.6  >3  Right Sup Peroneal Anti Sensory (Ant Lat Mall)  12 cm NR  <4.6  >3  Left Sural Anti Sensory (Lat Mall)  Calf NR  <4.6  >3  Right Sural Anti  Sensory (Lat Mall)  Calf NR  <4.6  >3   Motor Summary Table   Site NR Onset (ms) Norm Onset (ms) O-P Amp (mV) Norm O-P Amp Site1 Site2 Delta-0 (ms) Dist (cm) Vel (m/s) Norm Vel (m/s)  Left Femoral Motor (Vastus Med)  Abv Ing Lig    4.9 <6.5 3.6 >3        Below Ing Lig    4.1  2.5         Right Femoral Motor (Vastus Med)  Abv Ing Lig    7.7 <6.5 0.3 >3        Below Ing Lig    8.3  0.4         Left Peroneal Motor (Ext Dig Brev)  Ankle NR  <6.0  >2.5 B Fib Ankle  0.0  >40  B Fib NR     Poplt B Fib  0.0  >40  Poplt NR            Right Peroneal Motor (Ext Dig Brev)  Ankle NR  <6.0  >2.5 B Fib Ankle  0.0  >40  B Fib NR     Poplt B Fib  0.0  >40  Poplt NR            Left Peroneal TA Motor (Tib Ant)  Fib Head NR  <4.5  >3 Poplit Fib Head  0.0  >40  Poplit NR            Right Peroneal TA Motor (Tib Ant)  Fib Head    4.1 <4.5 0.7 >3 Poplit Fib Head 1.6 10.0 62 >40  Poplit    5.7  0.7  Left Tibial Motor (Abd Hall Brev)  Ankle NR  <6.0  >4 Knee Ankle  0.0  >40  Knee NR            Right Tibial Motor (Abd Hall Brev)  Ankle NR  <6.0  >4 Knee Ankle  0.0  >40  Knee NR             F Wave Studies   NR F-Lat (ms) Lat Norm (ms) L-R F-Lat (ms) M-Lat (ms) FLat-MLat (ms)  Left Tibial (Mrkrs) (Abd Hallucis)     46.09 <55  6.93 39.16   H Reflex Studies   NR H-Lat (ms) Lat Norm (ms) L-R H-Lat (ms) M-Lat (ms) HLat-MLat (ms)  Left Tibial (Gastroc)     45.17 <35 4.08 8.16 37.01  Right Tibial (Gastroc)     49.25 <35 4.08 8.16 41.09   EMG   Side Muscle Ins Act Fibs Psw Fasc Number Recrt Dur Dur. Amp Amp. Poly Poly. Comment  Right AntTibialis Nml Nml Nml Nml NE None - - - - - - ATR  Left AntTibialis Nml Nml Nml Nml NE None - - - - - - ATR  Left Gastroc Nml Nml Nml Nml 2- Rapid Many 1+ Many 1+ Nml Nml ATR  Left Flex Dig Long Nml Nml Nml Nml [facility] Rapid Many 1+ Many 1+ Nml Nml ATR  Left RectFemoris Nml Nml Nml Nml 2- Rapid Many 1+ Many 1+ Nml Nml ATR  Left GluteusMed Nml Nml Nml Nml 3- Rapid  Many 1+ Many 1+ Nml Nml ATR  Right Gastroc Nml Nml Nml Nml 2- Rapid Some 1+ Some 1+ Nml Nml ATR  Right Flex Dig Long Nml Nml Nml Nml 3- Rapid Many 1+ Many 1+ Nml Nml ATR  Right RectFemoris Nml Nml Nml Nml NE - - - - - - - ATR  Right GluteusMed Nml Nml Nml Nml 3- Rapid Many 1+ Many 1+ Nml Nml ATR  Right AdductorLong Nml Nml Nml Nml 2- Rapid Some 1+ Some 1+ Nml Nml ATR  Right BicepsFemS Nml Nml Nml Nml 1- Nml Nml Nml Nml Nml Nml Nml N/A      Waveforms:

## 2016-03-21 NOTE — Telephone Encounter (Signed)
Okay to refill? 

## 2016-03-22 ENCOUNTER — Ambulatory Visit (HOSPITAL_COMMUNITY)
Admission: RE | Admit: 2016-03-22 | Discharge: 2016-03-22 | Disposition: A | Discharge status: Home / Self Care | Payer: Commercial Managed Care - HMO | Source: Ambulatory Visit | Attending: Cardiology | Admitting: Cardiology

## 2016-03-22 ENCOUNTER — Telehealth: Payer: Self-pay | Admitting: Neurology

## 2016-03-22 ENCOUNTER — Encounter (HOSPITAL_COMMUNITY): Payer: Self-pay

## 2016-03-22 VITALS — BP 96/52 | HR 73

## 2016-03-22 DIAGNOSIS — I2510 Atherosclerotic heart disease of native coronary artery without angina pectoris: Secondary | ICD-10-CM | POA: Insufficient documentation

## 2016-03-22 DIAGNOSIS — Z5189 Encounter for other specified aftercare: Secondary | ICD-10-CM | POA: Diagnosis not present

## 2016-03-22 DIAGNOSIS — I130 Hypertensive heart and chronic kidney disease with heart failure and stage 1 through stage 4 chronic kidney disease, or unspecified chronic kidney disease: Secondary | ICD-10-CM | POA: Insufficient documentation

## 2016-03-22 DIAGNOSIS — Z794 Long term (current) use of insulin: Secondary | ICD-10-CM | POA: Diagnosis not present

## 2016-03-22 DIAGNOSIS — Z79899 Other long term (current) drug therapy: Secondary | ICD-10-CM | POA: Diagnosis not present

## 2016-03-22 DIAGNOSIS — I472 Ventricular tachycardia, unspecified: Secondary | ICD-10-CM

## 2016-03-22 DIAGNOSIS — E1122 Type 2 diabetes mellitus with diabetic chronic kidney disease: Secondary | ICD-10-CM | POA: Insufficient documentation

## 2016-03-22 DIAGNOSIS — Z9889 Other specified postprocedural states: Secondary | ICD-10-CM | POA: Insufficient documentation

## 2016-03-22 DIAGNOSIS — Z7982 Long term (current) use of aspirin: Secondary | ICD-10-CM | POA: Diagnosis not present

## 2016-03-22 DIAGNOSIS — I5042 Chronic combined systolic (congestive) and diastolic (congestive) heart failure: Secondary | ICD-10-CM | POA: Diagnosis not present

## 2016-03-22 DIAGNOSIS — N183 Chronic kidney disease, stage 3 unspecified: Secondary | ICD-10-CM

## 2016-03-22 DIAGNOSIS — N184 Chronic kidney disease, stage 4 (severe): Secondary | ICD-10-CM | POA: Diagnosis not present

## 2016-03-22 DIAGNOSIS — N320 Bladder-neck obstruction: Secondary | ICD-10-CM | POA: Diagnosis not present

## 2016-03-22 LAB — BASIC METABOLIC PANEL
Anion gap: 6 (ref 5–15)
BUN: 38 mg/dL — ABNORMAL HIGH (ref 6–20)
CALCIUM: 8.4 mg/dL — AB (ref 8.9–10.3)
CO2: 25 mmol/L (ref 22–32)
CREATININE: 1.56 mg/dL — AB (ref 0.61–1.24)
Chloride: 112 mmol/L — ABNORMAL HIGH (ref 101–111)
GFR calc non Af Amer: 44 mL/min — ABNORMAL LOW (ref >60)
GFR, EST AFRICAN AMERICAN: 51 mL/min — AB (ref >60)
Glucose, Bld: 197 mg/dL — ABNORMAL HIGH (ref 65–99)
Potassium: 4.7 mmol/L (ref 3.5–5.1)
SODIUM: 143 mmol/L (ref 135–145)

## 2016-03-22 LAB — BRAIN NATRIURETIC PEPTIDE: B NATRIURETIC PEPTIDE 5: 580.4 pg/mL — AB (ref 0.0–100.0)

## 2016-03-22 MED ORDER — CLOPIDOGREL BISULFATE 75 MG PO TABS: 75.0000 mg | ORAL_TABLET | Freq: Every day | ORAL | 0 refills | Status: DC

## 2016-03-22 NOTE — Telephone Encounter (Signed)
Discussed results of EMG with patient, his wife, and daughter.  Results showed severe polyneuropathy affecting the legs with superimposed L3-5  lumbosacral radiculopathy and a R femoral neuropathy.  CT lumbar spine shows advanced spinal stenosis at L3-4 and bilateral foraminal stenosis as well as moderate spinal stenosis at L4-5.  Unfortunately, with his medical comorbidities we are really limited with his management options, especially as he would not be a candidate for surgery and overall prognosis is poor.  I recommend continuing home stretching and ROM exercises.  He denies any pain or discomfort.  He may benefit from home safety evaluation by PT and certainly needs home health care for assistance and will request this be done through his PCP's office.  I have discussed the above findings with Dr. Ronnie Derby, orthopeadics, who agrees with conservative management options.    Donika K. Posey Pronto, DO

## 2016-03-22 NOTE — Patient Instructions (Signed)
Routine lab work today. Will notify you of abnormal results, otherwise no news is good news!  Follow up 2-3 months with Dr. Haroldine Laws.  Do the following things EVERYDAY: 1) Weigh yourself in the morning before breakfast. Write it down and keep it in a log. 2) Take your medicines as prescribed 3) Eat low salt foods-Limit salt (sodium) to 2000 mg per day.  4) Stay as active as you can everyday 5) Limit all fluids for the day to less than 2 liters
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
## 2016-03-22 NOTE — Telephone Encounter (Signed)
Should this come from Cardiology?

## 2016-03-26 NOTE — Telephone Encounter (Signed)
This encounter was created in error - please disregard.

## 2016-03-27 ENCOUNTER — Encounter: Payer: Self-pay | Admitting: Family Medicine

## 2016-03-27 ENCOUNTER — Ambulatory Visit (INDEPENDENT_AMBULATORY_CARE_PROVIDER_SITE_OTHER): Payer: Commercial Managed Care - HMO | Admitting: Family Medicine

## 2016-03-27 VITALS — BP 124/80 | HR 76 | Temp 98.3°F | Resp 12 | Ht 74.0 in

## 2016-03-27 DIAGNOSIS — E1121 Type 2 diabetes mellitus with diabetic nephropathy: Secondary | ICD-10-CM

## 2016-03-27 DIAGNOSIS — E1122 Type 2 diabetes mellitus with diabetic chronic kidney disease: Secondary | ICD-10-CM | POA: Diagnosis not present

## 2016-03-27 DIAGNOSIS — I130 Hypertensive heart and chronic kidney disease with heart failure and stage 1 through stage 4 chronic kidney disease, or unspecified chronic kidney disease: Secondary | ICD-10-CM | POA: Diagnosis not present

## 2016-03-27 DIAGNOSIS — S72001D Fracture of unspecified part of neck of right femur, subsequent encounter for closed fracture with routine healing: Secondary | ICD-10-CM | POA: Diagnosis not present

## 2016-03-27 DIAGNOSIS — N183 Chronic kidney disease, stage 3 unspecified: Secondary | ICD-10-CM

## 2016-03-27 DIAGNOSIS — R2681 Unsteadiness on feet: Secondary | ICD-10-CM

## 2016-03-27 DIAGNOSIS — L97522 Non-pressure chronic ulcer of other part of left foot with fat layer exposed: Secondary | ICD-10-CM | POA: Diagnosis not present

## 2016-03-27 DIAGNOSIS — N312 Flaccid neuropathic bladder, not elsewhere classified: Secondary | ICD-10-CM | POA: Diagnosis not present

## 2016-03-27 DIAGNOSIS — L89893 Pressure ulcer of other site, stage 3: Secondary | ICD-10-CM | POA: Diagnosis not present

## 2016-03-27 DIAGNOSIS — F320 Major depressive disorder, single episode, mild: Secondary | ICD-10-CM

## 2016-03-27 DIAGNOSIS — Z794 Long term (current) use of insulin: Secondary | ICD-10-CM

## 2016-03-27 DIAGNOSIS — E11621 Type 2 diabetes mellitus with foot ulcer: Secondary | ICD-10-CM | POA: Diagnosis not present

## 2016-03-27 NOTE — Progress Notes (Signed)
Pre visit review using our clinic review tool, if applicable. No additional management support is needed unless otherwise documented below in the visit note. 

## 2016-03-27 NOTE — Patient Instructions (Addendum)
A few things to remember from today's visit:   CKD (chronic kidney disease) stage 3, GFR 30-59 ml/min  Decubitus ulcer of foot, stage 3, unspecified laterality ([place]) - Plan: Ambulatory referral to [place]  Type 2 diabetes mellitus with diabetic nephropathy, with long-term current use of insulin (HCC)  Depression, major, single episode, mild (HCC)  No changes in meds today.   Please be sure medication list is accurate. If a new problem present, please set up appointment sooner than planned today.

## 2016-03-27 NOTE — Progress Notes (Signed)
HPI:   [name] [name] is a 70 y.o. male, who is here today with his daughter and wife to follow on recent hospitalization.   He was admitted on 03/06/16 due to hyperK+ and ARF, elevated K+ was noted on labs done at the cardiologists' office. He received treatmetn with Kayexalate. Spironolactone and Digoxin have been discontinued.   Lab Results  Component Value Date   CREATININE 1.56 (H) 03/22/2016   BUN 38 (H) 03/22/2016   NA 143 03/22/2016   K 4.7 03/22/2016   CL 112 (H) 03/22/2016   CO2 25 03/22/2016   Hx of neurogenic bladder, he was discharged with urine cath, has appt with urologists' office for cath change 04/26/16 and for follow up 05/22/16.   He has seen cardiologists, 03/22/16, BMP was done then and K+ back to normal, CKD III stable.   Next cardiologists follow up in 05/2015 Neurologists evaluation, Dr Posey Pronto, for peripheral neuropathy, which is severe (neuropathy and radiculopathy), conservative treatment recommended, he is not a good candidate for lumbar stenosis surgical options due to his other chronic medical problems.   Concerns today: HH. He is not receiving [facility] services since his last hospital discharge.  He lives with wife, who has some medical issues and s/p pericardiocentesis recently, she is not allowed to do heavy lifting for a least 6 weeks. His daughter is helping with some ADL's. He has not had a full bath since hospital discharge. He needs help with shower, dressing, grooming, and transferred.  He is in a wheel chair and tranfers from bed to wheel chair and/or to recliner. He has not been ambulatory since fall in [facility] 11/2015, when he re injured RLE with no evidence of fracture; he was admitted to NH at that time for rehab after suffering an earlier fall with hip fracture.     Decubitus ulcers on feet:  He has a few more superficial ulcers on feet but seems to be improving. According to daughter, he follows with wound clinic but lesions seems  worse with treatments, so now she is taking care of wounds as she learned from prior [facility] wound care; for now she prefers to continue care at home, lesions are shrinking.   She denies chest pain, dyspnea, or PND. Stable orthopnea, sleeping on hospital bed at 45 degree angle.    DM II: On Lantus 20 U at bedtime and Novolog sliding scale.  BS 99-300's  Lab Results  Component Value Date   HGBA1C 8.0 (H) 03/08/2016   He denies hypoglycemia. According to wife, his diet is better now that he is at home. He is gaining wt, not sure how much because not able to weigh him.  Remeron was added a few months ago to help with appetite, depression,and sleep. Wife states that mood is much better and he is sleeping well. No side effects from medication.    Review of Systems  Constitutional: Positive for fatigue. Negative for appetite change, chills and fever.  HENT: Negative for mouth sores, nosebleeds, sore throat and trouble swallowing.   Eyes: Negative for pain and visual disturbance.  Respiratory: Negative for cough, shortness of breath and wheezing.   Cardiovascular: Positive for leg swelling. Negative for chest pain and palpitations.  Gastrointestinal: Negative for abdominal pain, nausea and vomiting.       No changes in bowel habits.  Genitourinary: Negative for decreased urine volume and hematuria.  Musculoskeletal: Positive for arthralgias and gait problem. Negative for myalgias.  Skin: Positive for  wound (feet). Negative for rash.  Neurological: Positive for weakness (no new one). Negative for syncope and headaches.  Hematological: Bruises/bleeds easily.  Psychiatric/Behavioral: Negative for confusion and sleep disturbance. The patient is not nervous/anxious.       Current Outpatient Prescriptions on File Prior to Visit  Medication Sig Dispense Refill  . Amino Acids-Protein Hydrolys (FEEDING SUPPLEMENT, PRO-STAT SUGAR FREE 64,) LIQD Take 30 mLs by mouth 2 (two) times daily. 900 mL  0  . amiodarone (PACERONE) 200 MG tablet Take 200 mg by mouth 2 (two) times daily.     . Ascorbic Acid (VITAMIN C PO) Take 1 tablet by mouth 2 (two) times daily.    Marland Kitchen aspirin EC 81 MG tablet Take 81 mg by mouth daily.    . carvedilol (COREG) 3.125 MG tablet Take 1.5625 mg by mouth 2 (two) times daily with a meal.    . clopidogrel (PLAVIX) 75 MG tablet Take 1 tablet (75 mg total) by mouth daily. 30 tablet 0  . dextromethorphan-guaiFENesin (MUCINEX DM) 30-600 MG 12hr tablet Take 1 tablet by mouth daily. 30 tablet 0  . finasteride (PROSCAR) 5 MG tablet Take 5 mg by mouth daily.     Marland Kitchen HYDROcodone-acetaminophen (NORCO/VICODIN) 5-325 MG tablet Take 1-2 tablets by mouth every 6 (six) hours as needed for moderate pain. 30 tablet 0  . insulin aspart (NOVOLOG FLEXPEN) 100 UNIT/ML FlexPen Inject 5-15 Units into the skin 3 (three) times daily with meals. Per sliding scale    . Insulin Glargine (LANTUS SOLOSTAR) 100 UNIT/ML Solostar Pen Inject 20 Units into the skin at bedtime.    Marland Kitchen MAGNESIUM PO Take 1 tablet by mouth every other day.    . mirtazapine (REMERON) 15 MG tablet TAKE 1 TABLET(15 MG) BY MOUTH AT BEDTIME 30 tablet 2  . multivitamin-iron-minerals-folic acid (THERAPEUTIC-M) TABS tablet Take 1 tablet by mouth daily.    . Probiotic Product (PROBIOTIC PO) Take 1 capsule by mouth daily.    . rosuvastatin (CRESTOR) 40 MG tablet Take 40 mg by mouth daily.    . tamsulosin (FLOMAX) 0.4 MG CAPS capsule Take 1 capsule (0.4 mg total) by mouth daily. 30 capsule 6  . torsemide (DEMADEX) 20 MG tablet Take 1 tablet (20 mg total) by mouth every other day. Start on Monday 03/12/2016 30 tablet 0  . vitamin A 10000 UNIT capsule Take 10,000 Units by mouth daily.    Marland Kitchen zinc sulfate 220 (50 Zn) MG capsule Take 220 mg by mouth daily.     No current facility-administered medications on file prior to visit.      Past Medical History:  Diagnosis Date  . Automatic implantable cardioverter-defibrillator in situ   . CAD  (coronary artery disease)    a. LHC (08/2005):  Ostial LAD 99%, mid LAD 50%, superior D1 80%, inferior D1 75%, mid OM proximal 80%, proximal PDA 25-30%, mid RCA 99%.   MRI with full thickness scar.  Med Rx.  2015 demonstrated similar diffuse three-vessel coronary disease. Perfusion imaging with rest we distribution demonstrated no viability in the area of the LAD and circumflex. Medical management.  . CAD, multiple vessel, RCA 100% mid occl.; LAD 99% stenosisprox.  mid of 50-60%; LCX OM-1 90%, OM-2 99%,AV groove 80%  12/10/2013  . Chronic combined systolic and diastolic heart failure ([place])   . CKD (chronic kidney disease)   . Gout   . Hx of echocardiogram 2015   Echo (08/2013):  EF 10-15%, diff HK, mod LAE, severe RVE, mod reduced RVSF,  mild RAE, PASP 41 mmHg.  Marland Kitchen Hypertension   . Ischemic cardiomyopathy    Echo (8/11):  EF 40-45%;  Echo (5/15):  EF 10-15%  . Myocardial infarction 2007   out of hosp MI - LHC with 3v CAD rx medically  . NSVT (nonsustained ventricular tachycardia) (HCC)    s/p ICD  . Peripheral neuropathy ([place])   . Sleep apnea    "has CPAP; won't use it"  . Stroke [facility]) ~ 2013   "right leg weak since" (12/09/2013)  . Type II diabetes mellitus ([place]) dx'd 2005   Allergies  Allergen Reactions  . Sulfa Antibiotics Rash    Social History   Social History  . Marital status: Married    Spouse name: N/A  . Number of children: N/A  . Years of education: N/A   Occupational History  . retired    Social History Main Topics  . Smoking status: Never Smoker  . Smokeless tobacco: Never Used  . Alcohol use No  . Drug use: No  . Sexual activity: Not Asked   Other Topics Concern  . 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   He lives with wife.   Highest level of education:  High school       Vitals:   03/27/16 1401  BP: 124/80  Pulse: 76  Resp: 12  Temp:  98.3 F (36.8 C)    O2 sat at RA 96%  There is no height or weight on file to calculate BMI.      Physical Exam  Nursing note and vitals reviewed. Constitutional: He is oriented to person, place, and time. He appears well-developed. No distress.  HENT:  Head: Atraumatic.  Mouth/Throat: Oropharynx is clear and moist and mucous membranes are normal.  Eyes: Conjunctivae and EOM are normal.  Cardiovascular: Normal rate and regular rhythm.   Murmur heard. Respiratory: Effort normal and breath sounds normal. No respiratory distress.  GI: Soft. There is no tenderness.  Musculoskeletal: He exhibits edema (trace pitting LE edema bilateral).  Neurological: He is alert and oriented to person, place, and time. Coordination normal.  In a wheel chair  Skin: Skin is warm. Ecchymosis (scattered on forearm, bilateral) noted. No erythema.     Psychiatric: He has a normal mood and affect. Cognition and memory are normal.  good eye contact.      ASSESSMENT AND PLAN:     Jakory was seen today for hospitalization follow-up.  Diagnoses and all orders for this visit:  Decubitus ulcer of foot, stage 3, unspecified laterality ([place])  Continue wound care through [facility]. Avoid pressure on affected areas, monitor for signs of infection.  -     Ambulatory referral to Home Health  CKD (chronic kidney disease) stage 3, GFR 30-59 ml/min  Back to his baseline. HyperK+ resolved. Low salt diet and avoidance of NSAID's. We will continue monitoring.  Type 2 diabetes mellitus with diabetic nephropathy, with long-term current use of insulin (HCC)  Improved. Goal < 8.0. No changes in current management. Periodic eye examination, dental care, and continue foot care (prevention of new ulcers and complications from those present) F/U in 3 months.  Depression, major, single episode, mild ([place])  His wife would like to continue same dose of Remeron because it has helped. We discussed a few side  effects. No changes for now.  Unstable gait  Fall  precautions discussed.  HH to be arrange to help with ADL's and PT/OC evaluation.     -Mr. NAJEE MANNINEN was advised to return sooner than planned today if new concerns arise.       [name] G. [name], MD  [facility]. [place] office.

## 2016-03-28 ENCOUNTER — Telehealth: Payer: Self-pay | Admitting: Family Medicine

## 2016-03-28 NOTE — Telephone Encounter (Signed)
Inez Catalina with [facility] would like to know if [name] will sign orders for pt to have  Home health nurse and home health aid

## 2016-03-28 NOTE — Telephone Encounter (Signed)
See below

## 2016-03-29 DIAGNOSIS — L97522 Non-pressure chronic ulcer of other part of left foot with fat layer exposed: Secondary | ICD-10-CM | POA: Diagnosis not present

## 2016-03-29 DIAGNOSIS — S72001D Fracture of unspecified part of neck of right femur, subsequent encounter for closed fracture with routine healing: Secondary | ICD-10-CM | POA: Diagnosis not present

## 2016-03-29 DIAGNOSIS — E11621 Type 2 diabetes mellitus with foot ulcer: Secondary | ICD-10-CM | POA: Diagnosis not present

## 2016-03-29 DIAGNOSIS — I130 Hypertensive heart and chronic kidney disease with heart failure and stage 1 through stage 4 chronic kidney disease, or unspecified chronic kidney disease: Secondary | ICD-10-CM | POA: Diagnosis not present

## 2016-03-29 DIAGNOSIS — E1122 Type 2 diabetes mellitus with diabetic chronic kidney disease: Secondary | ICD-10-CM | POA: Diagnosis not present

## 2016-03-29 NOTE — Telephone Encounter (Signed)
I am fine with signing orders needed, I just placed another referral for [facility]. Thanks, BJ

## 2016-03-29 NOTE — Telephone Encounter (Signed)
Verbal given 

## 2016-03-30 ENCOUNTER — Other Ambulatory Visit: Payer: Self-pay | Admitting: *Deleted

## 2016-03-30 DIAGNOSIS — L97522 Non-pressure chronic ulcer of other part of left foot with fat layer exposed: Secondary | ICD-10-CM | POA: Diagnosis not present

## 2016-03-30 DIAGNOSIS — E11621 Type 2 diabetes mellitus with foot ulcer: Secondary | ICD-10-CM | POA: Diagnosis not present

## 2016-03-30 DIAGNOSIS — S72001D Fracture of unspecified part of neck of right femur, subsequent encounter for closed fracture with routine healing: Secondary | ICD-10-CM | POA: Diagnosis not present

## 2016-03-30 DIAGNOSIS — I130 Hypertensive heart and chronic kidney disease with heart failure and stage 1 through stage 4 chronic kidney disease, or unspecified chronic kidney disease: Secondary | ICD-10-CM | POA: Diagnosis not present

## 2016-03-30 DIAGNOSIS — E1122 Type 2 diabetes mellitus with diabetic chronic kidney disease: Secondary | ICD-10-CM | POA: Diagnosis not present

## 2016-04-02 ENCOUNTER — Other Ambulatory Visit: Payer: Self-pay | Admitting: *Deleted

## 2016-04-02 ENCOUNTER — Encounter: Payer: Self-pay | Admitting: *Deleted

## 2016-04-02 DIAGNOSIS — E1122 Type 2 diabetes mellitus with diabetic chronic kidney disease: Secondary | ICD-10-CM | POA: Diagnosis not present

## 2016-04-02 DIAGNOSIS — S72001D Fracture of unspecified part of neck of right femur, subsequent encounter for closed fracture with routine healing: Secondary | ICD-10-CM | POA: Diagnosis not present

## 2016-04-02 DIAGNOSIS — E11621 Type 2 diabetes mellitus with foot ulcer: Secondary | ICD-10-CM | POA: Diagnosis not present

## 2016-04-02 DIAGNOSIS — I130 Hypertensive heart and chronic kidney disease with heart failure and stage 1 through stage 4 chronic kidney disease, or unspecified chronic kidney disease: Secondary | ICD-10-CM | POA: Diagnosis not present

## 2016-04-02 DIAGNOSIS — L97522 Non-pressure chronic ulcer of other part of left foot with fat layer exposed: Secondary | ICD-10-CM | POA: Diagnosis not present

## 2016-04-02 NOTE — Patient Outreach (Signed)
Transtition of care call. Pt reassigned to me after primary care manager went of LOA. I talked with Mr. Perrault today and he is doing very wee. He is confined to a wheelchair after his recent falls. He has severe peripheral neuropathy and diabetic foot wounds. He recently saw Dr. [name] his primary care MD and she ordered home health care for nursing and PT. Pt resides with his wife and his daughter helps him with bathing and organizing his medications.  He agrees to continue [facility] Transition of care calls with me. I have advised him if he needs me to visit I will be glad to do so, otherwise I will only call while home health is providing services.  I will call him again in one week.

## 2016-04-04 DIAGNOSIS — I5022 Chronic systolic (congestive) heart failure: Secondary | ICD-10-CM | POA: Diagnosis not present

## 2016-04-04 DIAGNOSIS — I5040 Unspecified combined systolic (congestive) and diastolic (congestive) heart failure: Secondary | ICD-10-CM | POA: Diagnosis not present

## 2016-04-04 DIAGNOSIS — M6281 Muscle weakness (generalized): Secondary | ICD-10-CM | POA: Diagnosis not present

## 2016-04-04 DIAGNOSIS — R278 Other lack of coordination: Secondary | ICD-10-CM | POA: Diagnosis not present

## 2016-04-04 DIAGNOSIS — Z4789 Encounter for other orthopedic aftercare: Secondary | ICD-10-CM | POA: Diagnosis not present

## 2016-04-04 DIAGNOSIS — R262 Difficulty in walking, not elsewhere classified: Secondary | ICD-10-CM | POA: Diagnosis not present

## 2016-04-04 DIAGNOSIS — I2510 Atherosclerotic heart disease of native coronary artery without angina pectoris: Secondary | ICD-10-CM | POA: Diagnosis not present

## 2016-04-04 DIAGNOSIS — E1122 Type 2 diabetes mellitus with diabetic chronic kidney disease: Secondary | ICD-10-CM | POA: Diagnosis not present

## 2016-04-04 DIAGNOSIS — L89623 Pressure ulcer of left heel, stage 3: Secondary | ICD-10-CM | POA: Diagnosis not present

## 2016-04-04 DIAGNOSIS — I130 Hypertensive heart and chronic kidney disease with heart failure and stage 1 through stage 4 chronic kidney disease, or unspecified chronic kidney disease: Secondary | ICD-10-CM | POA: Diagnosis not present

## 2016-04-04 DIAGNOSIS — L89892 Pressure ulcer of other site, stage 2: Secondary | ICD-10-CM | POA: Diagnosis not present

## 2016-04-06 DIAGNOSIS — I5040 Unspecified combined systolic (congestive) and diastolic (congestive) heart failure: Secondary | ICD-10-CM | POA: Diagnosis not present

## 2016-04-06 DIAGNOSIS — L89623 Pressure ulcer of left heel, stage 3: Secondary | ICD-10-CM | POA: Diagnosis not present

## 2016-04-06 DIAGNOSIS — E1122 Type 2 diabetes mellitus with diabetic chronic kidney disease: Secondary | ICD-10-CM | POA: Diagnosis not present

## 2016-04-06 DIAGNOSIS — I130 Hypertensive heart and chronic kidney disease with heart failure and stage 1 through stage 4 chronic kidney disease, or unspecified chronic kidney disease: Secondary | ICD-10-CM | POA: Diagnosis not present

## 2016-04-06 DIAGNOSIS — L89892 Pressure ulcer of other site, stage 2: Secondary | ICD-10-CM | POA: Diagnosis not present

## 2016-04-09 ENCOUNTER — Other Ambulatory Visit: Payer: Self-pay | Admitting: Family Medicine

## 2016-04-09 ENCOUNTER — Encounter: Payer: Commercial Managed Care - HMO | Admitting: *Deleted

## 2016-04-09 ENCOUNTER — Telehealth: Payer: Self-pay | Admitting: Cardiology

## 2016-04-09 DIAGNOSIS — E1122 Type 2 diabetes mellitus with diabetic chronic kidney disease: Secondary | ICD-10-CM | POA: Diagnosis not present

## 2016-04-09 DIAGNOSIS — I130 Hypertensive heart and chronic kidney disease with heart failure and stage 1 through stage 4 chronic kidney disease, or unspecified chronic kidney disease: Secondary | ICD-10-CM | POA: Diagnosis not present

## 2016-04-09 DIAGNOSIS — L89892 Pressure ulcer of other site, stage 2: Secondary | ICD-10-CM | POA: Diagnosis not present

## 2016-04-09 DIAGNOSIS — L89623 Pressure ulcer of left heel, stage 3: Secondary | ICD-10-CM | POA: Diagnosis not present

## 2016-04-09 DIAGNOSIS — I5040 Unspecified combined systolic (congestive) and diastolic (congestive) heart failure: Secondary | ICD-10-CM | POA: Diagnosis not present

## 2016-04-09 NOTE — Telephone Encounter (Signed)
Spoke with pt and reminded pt of remote transmission that is due today. Pt verbalized understanding.   

## 2016-04-10 ENCOUNTER — Ambulatory Visit: Payer: Commercial Managed Care - HMO | Admitting: Family Medicine

## 2016-04-10 DIAGNOSIS — I5040 Unspecified combined systolic (congestive) and diastolic (congestive) heart failure: Secondary | ICD-10-CM | POA: Diagnosis not present

## 2016-04-10 DIAGNOSIS — E1122 Type 2 diabetes mellitus with diabetic chronic kidney disease: Secondary | ICD-10-CM | POA: Diagnosis not present

## 2016-04-10 DIAGNOSIS — I130 Hypertensive heart and chronic kidney disease with heart failure and stage 1 through stage 4 chronic kidney disease, or unspecified chronic kidney disease: Secondary | ICD-10-CM | POA: Diagnosis not present

## 2016-04-10 DIAGNOSIS — L89623 Pressure ulcer of left heel, stage 3: Secondary | ICD-10-CM | POA: Diagnosis not present

## 2016-04-10 DIAGNOSIS — L89892 Pressure ulcer of other site, stage 2: Secondary | ICD-10-CM | POA: Diagnosis not present

## 2016-04-10 NOTE — Telephone Encounter (Signed)
Okay to refill? 

## 2016-04-10 NOTE — Telephone Encounter (Signed)
Mucinex DM is not recommended given his medical Hx [also it is OTC medication] and Amiodarone is from his cardiologists. Thanks, BJ

## 2016-04-11 ENCOUNTER — Other Ambulatory Visit: Payer: Self-pay | Admitting: *Deleted

## 2016-04-12 DIAGNOSIS — E1122 Type 2 diabetes mellitus with diabetic chronic kidney disease: Secondary | ICD-10-CM | POA: Diagnosis not present

## 2016-04-12 DIAGNOSIS — I5040 Unspecified combined systolic (congestive) and diastolic (congestive) heart failure: Secondary | ICD-10-CM | POA: Diagnosis not present

## 2016-04-12 DIAGNOSIS — I130 Hypertensive heart and chronic kidney disease with heart failure and stage 1 through stage 4 chronic kidney disease, or unspecified chronic kidney disease: Secondary | ICD-10-CM | POA: Diagnosis not present

## 2016-04-12 DIAGNOSIS — L89892 Pressure ulcer of other site, stage 2: Secondary | ICD-10-CM | POA: Diagnosis not present

## 2016-04-12 DIAGNOSIS — L89623 Pressure ulcer of left heel, stage 3: Secondary | ICD-10-CM | POA: Diagnosis not present

## 2016-04-12 NOTE — Patient Outreach (Addendum)
Transition of care call. Mr. Crass is doing well except for his foot wounds. He says the home health nurse is providing wound care but the wounds are not making any progress towards healing. He has an appt at the wound center on 04/17/16 for an initial assessment. He reports he has had a vascular study on his legs in the last couple of months.   I have encouraged him to elevate his legs to his heart level as much as he can when he is seated.  I will call him again next week to follow up on the outcome of the wound assessment.  THN CM Care Plan Problem One   Flowsheet Row Most Recent Value  Care Plan Problem One  Pt has nonhealing foot wounds.  Role Documenting the Problem One  Care Management [place] for Problem One  Active  THN Long Term Goal (31-90 days)  Pt to follow instructions from wound center for wound healing over the next 31 days.  THN Long Term Goal Start Date  04/12/16  THN Long Term Goal Met Date  04/12/16  Interventions for Problem One Long Term Goal  Encouraged elevation of legs as much as possible.  THN CM Short Term Goal #1 (0-30 days)  Pt will attend wound clinic eval on 12/26.  THN CM Short Term Goal #1 Start Date  03/14/16  [facility] CM Short Term Goal #1 Met Date  04/12/16  Interventions for Short Term Goal #1  Encouraged attendance of appt.  THN CM Short Term Goal #2 (0-30 days)  Pt to advise NP of plan of care for possible surgery and contact NP for any problems during the next 31 days.  THN CM Short Term Goal #2 Start Date  04/12/16  [facility] CM Short Term Goal #2 Met Date  04/12/16  Interventions for Short Term Goal #2  Reviewed my role and reinforced my availability to pt.     Deloria Lair [facility] [place] [phone]

## 2016-04-13 ENCOUNTER — Encounter: Payer: Self-pay | Admitting: Cardiology

## 2016-04-14 ENCOUNTER — Other Ambulatory Visit (HOSPITAL_COMMUNITY): Payer: Self-pay | Admitting: Student

## 2016-04-17 ENCOUNTER — Ambulatory Visit (HOSPITAL_COMMUNITY)
Admission: RE | Admit: 2016-04-17 | Discharge: 2016-04-17 | Disposition: A | Discharge status: Home / Self Care | Payer: Commercial Managed Care - HMO | Source: Ambulatory Visit | Attending: Surgery | Admitting: Surgery

## 2016-04-17 ENCOUNTER — Other Ambulatory Visit: Payer: Self-pay | Admitting: Family Medicine

## 2016-04-17 ENCOUNTER — Other Ambulatory Visit: Payer: Self-pay | Admitting: Surgery

## 2016-04-17 ENCOUNTER — Encounter (HOSPITAL_BASED_OUTPATIENT_CLINIC_OR_DEPARTMENT_OTHER): Payer: Commercial Managed Care - HMO | Attending: Surgery

## 2016-04-17 DIAGNOSIS — Z79899 Other long term (current) drug therapy: Secondary | ICD-10-CM | POA: Insufficient documentation

## 2016-04-17 DIAGNOSIS — E1122 Type 2 diabetes mellitus with diabetic chronic kidney disease: Secondary | ICD-10-CM | POA: Insufficient documentation

## 2016-04-17 DIAGNOSIS — I130 Hypertensive heart and chronic kidney disease with heart failure and stage 1 through stage 4 chronic kidney disease, or unspecified chronic kidney disease: Secondary | ICD-10-CM | POA: Insufficient documentation

## 2016-04-17 DIAGNOSIS — I5042 Chronic combined systolic (congestive) and diastolic (congestive) heart failure: Secondary | ICD-10-CM | POA: Diagnosis not present

## 2016-04-17 DIAGNOSIS — Z7902 Long term (current) use of antithrombotics/antiplatelets: Secondary | ICD-10-CM | POA: Insufficient documentation

## 2016-04-17 DIAGNOSIS — Z8673 Personal history of transient ischemic attack (TIA), and cerebral infarction without residual deficits: Secondary | ICD-10-CM | POA: Diagnosis not present

## 2016-04-17 DIAGNOSIS — E11621 Type 2 diabetes mellitus with foot ulcer: Secondary | ICD-10-CM | POA: Diagnosis not present

## 2016-04-17 DIAGNOSIS — G4730 Sleep apnea, unspecified: Secondary | ICD-10-CM | POA: Insufficient documentation

## 2016-04-17 DIAGNOSIS — N183 Chronic kidney disease, stage 3 (moderate): Secondary | ICD-10-CM | POA: Insufficient documentation

## 2016-04-17 DIAGNOSIS — Z95810 Presence of automatic (implantable) cardiac defibrillator: Secondary | ICD-10-CM | POA: Insufficient documentation

## 2016-04-17 DIAGNOSIS — I252 Old myocardial infarction: Secondary | ICD-10-CM | POA: Diagnosis not present

## 2016-04-17 DIAGNOSIS — Y849 Medical procedure, unspecified as the cause of abnormal reaction of the patient, or of later complication, without mention of misadventure at the time of the procedure: Secondary | ICD-10-CM | POA: Diagnosis not present

## 2016-04-17 DIAGNOSIS — L89613 Pressure ulcer of right heel, stage 3: Secondary | ICD-10-CM | POA: Diagnosis not present

## 2016-04-17 DIAGNOSIS — Z7982 Long term (current) use of aspirin: Secondary | ICD-10-CM | POA: Insufficient documentation

## 2016-04-17 DIAGNOSIS — T8131XA Disruption of external operation (surgical) wound, not elsewhere classified, initial encounter: Secondary | ICD-10-CM | POA: Diagnosis not present

## 2016-04-17 DIAGNOSIS — T8130XA Disruption of wound, unspecified, initial encounter: Secondary | ICD-10-CM | POA: Diagnosis not present

## 2016-04-17 DIAGNOSIS — L89623 Pressure ulcer of left heel, stage 3: Secondary | ICD-10-CM | POA: Insufficient documentation

## 2016-04-17 DIAGNOSIS — I255 Ischemic cardiomyopathy: Secondary | ICD-10-CM | POA: Insufficient documentation

## 2016-04-17 DIAGNOSIS — M869 Osteomyelitis, unspecified: Secondary | ICD-10-CM | POA: Insufficient documentation

## 2016-04-17 DIAGNOSIS — E1140 Type 2 diabetes mellitus with diabetic neuropathy, unspecified: Secondary | ICD-10-CM | POA: Diagnosis not present

## 2016-04-17 DIAGNOSIS — Z794 Long term (current) use of insulin: Secondary | ICD-10-CM | POA: Insufficient documentation

## 2016-04-17 DIAGNOSIS — S91302A Unspecified open wound, left foot, initial encounter: Secondary | ICD-10-CM | POA: Diagnosis not present

## 2016-04-17 DIAGNOSIS — L89522 Pressure ulcer of left ankle, stage 2: Secondary | ICD-10-CM | POA: Diagnosis not present

## 2016-04-17 DIAGNOSIS — S91301A Unspecified open wound, right foot, initial encounter: Secondary | ICD-10-CM | POA: Diagnosis not present

## 2016-04-17 DIAGNOSIS — L89610 Pressure ulcer of right heel, unstageable: Secondary | ICD-10-CM | POA: Diagnosis not present

## 2016-04-17 DIAGNOSIS — L89512 Pressure ulcer of right ankle, stage 2: Secondary | ICD-10-CM | POA: Insufficient documentation

## 2016-04-17 DIAGNOSIS — L89893 Pressure ulcer of other site, stage 3: Secondary | ICD-10-CM | POA: Diagnosis not present

## 2016-04-17 DIAGNOSIS — I25119 Atherosclerotic heart disease of native coronary artery with unspecified angina pectoris: Secondary | ICD-10-CM | POA: Diagnosis not present

## 2016-04-17 DIAGNOSIS — L89620 Pressure ulcer of left heel, unstageable: Secondary | ICD-10-CM | POA: Diagnosis not present

## 2016-04-18 ENCOUNTER — Telehealth: Payer: Self-pay | Admitting: Emergency Medicine

## 2016-04-18 ENCOUNTER — Other Ambulatory Visit (HOSPITAL_COMMUNITY): Payer: Self-pay | Admitting: *Deleted

## 2016-04-18 ENCOUNTER — Other Ambulatory Visit: Payer: Self-pay | Admitting: *Deleted

## 2016-04-18 MED ORDER — AMIODARONE HCL 200 MG PO TABS: 200.0000 mg | ORAL_TABLET | Freq: Two times a day (BID) | ORAL | 3 refills | Status: DC

## 2016-04-18 MED ORDER — INSULIN GLARGINE 100 UNIT/ML SOLOSTAR PEN: 20.0000 [IU] | PEN_INJECTOR | Freq: Every day | SUBCUTANEOUS | 1 refills | Status: DC

## 2016-04-18 NOTE — Telephone Encounter (Signed)
See below

## 2016-04-18 NOTE — Telephone Encounter (Signed)
Lantus was adjusted after hospital discharged. Continue Lantus 20 U daily.  Thanks, BJ

## 2016-04-18 NOTE — Telephone Encounter (Signed)
Pharmacy is requesting refill for Lantus Solostar Pen inj 5*75ml  Inject 75 units subcu at bedtime    I only see a prescription for 20 units and I didn't see where Dr. [name] change the units to 75. Please advise.   [facility]

## 2016-04-18 NOTE — Telephone Encounter (Signed)
Rx sent in to pharmacy. 

## 2016-04-18 NOTE — Patient Outreach (Signed)
Transtition of care call. Pt reports having had his wound center evaluation and wound debridement. He will be following up with them a couple of times a week. He reports his vascular studies revealed that he has good blood flow. He reports his glucose was in normal range this am 109 but at lunch it was 209. He reports having cereal (Frosted Flakes) with milk and some peaches.  Pt reports he is voiding large volumes of urine and he continues to have a foley catheter in place.  I advised him he should choose a different cereal with one that does not have any added sugar, perhaps Cherrios, He is in agreement to do this.  I encouraged him to try to follow his diabetic diet to keep his glucose in normal range to help with wound healing.  I will call him again in a week.  Deloria Lair [facility] [place] [phone]

## 2016-04-19 DIAGNOSIS — E1122 Type 2 diabetes mellitus with diabetic chronic kidney disease: Secondary | ICD-10-CM | POA: Diagnosis not present

## 2016-04-19 DIAGNOSIS — I5040 Unspecified combined systolic (congestive) and diastolic (congestive) heart failure: Secondary | ICD-10-CM | POA: Diagnosis not present

## 2016-04-19 DIAGNOSIS — L89892 Pressure ulcer of other site, stage 2: Secondary | ICD-10-CM | POA: Diagnosis not present

## 2016-04-19 DIAGNOSIS — I130 Hypertensive heart and chronic kidney disease with heart failure and stage 1 through stage 4 chronic kidney disease, or unspecified chronic kidney disease: Secondary | ICD-10-CM | POA: Diagnosis not present

## 2016-04-19 DIAGNOSIS — L89623 Pressure ulcer of left heel, stage 3: Secondary | ICD-10-CM | POA: Diagnosis not present

## 2016-04-20 DIAGNOSIS — L89892 Pressure ulcer of other site, stage 2: Secondary | ICD-10-CM | POA: Diagnosis not present

## 2016-04-20 DIAGNOSIS — E1122 Type 2 diabetes mellitus with diabetic chronic kidney disease: Secondary | ICD-10-CM | POA: Diagnosis not present

## 2016-04-20 DIAGNOSIS — I5040 Unspecified combined systolic (congestive) and diastolic (congestive) heart failure: Secondary | ICD-10-CM | POA: Diagnosis not present

## 2016-04-20 DIAGNOSIS — I130 Hypertensive heart and chronic kidney disease with heart failure and stage 1 through stage 4 chronic kidney disease, or unspecified chronic kidney disease: Secondary | ICD-10-CM | POA: Diagnosis not present

## 2016-04-20 DIAGNOSIS — L89623 Pressure ulcer of left heel, stage 3: Secondary | ICD-10-CM | POA: Diagnosis not present

## 2016-04-24 ENCOUNTER — Telehealth: Payer: Self-pay | Admitting: Family Medicine

## 2016-04-24 ENCOUNTER — Encounter (HOSPITAL_BASED_OUTPATIENT_CLINIC_OR_DEPARTMENT_OTHER): Payer: Commercial Managed Care - HMO | Attending: Surgery

## 2016-04-24 DIAGNOSIS — L89623 Pressure ulcer of left heel, stage 3: Secondary | ICD-10-CM | POA: Insufficient documentation

## 2016-04-24 DIAGNOSIS — L89512 Pressure ulcer of right ankle, stage 2: Secondary | ICD-10-CM | POA: Diagnosis not present

## 2016-04-24 DIAGNOSIS — D649 Anemia, unspecified: Secondary | ICD-10-CM | POA: Insufficient documentation

## 2016-04-24 DIAGNOSIS — G4730 Sleep apnea, unspecified: Secondary | ICD-10-CM | POA: Diagnosis not present

## 2016-04-24 DIAGNOSIS — Z8673 Personal history of transient ischemic attack (TIA), and cerebral infarction without residual deficits: Secondary | ICD-10-CM | POA: Insufficient documentation

## 2016-04-24 DIAGNOSIS — I5031 Acute diastolic (congestive) heart failure: Secondary | ICD-10-CM | POA: Insufficient documentation

## 2016-04-24 DIAGNOSIS — Z95810 Presence of automatic (implantable) cardiac defibrillator: Secondary | ICD-10-CM | POA: Insufficient documentation

## 2016-04-24 DIAGNOSIS — L89522 Pressure ulcer of left ankle, stage 2: Secondary | ICD-10-CM | POA: Insufficient documentation

## 2016-04-24 DIAGNOSIS — I25119 Atherosclerotic heart disease of native coronary artery with unspecified angina pectoris: Secondary | ICD-10-CM | POA: Insufficient documentation

## 2016-04-24 DIAGNOSIS — L89613 Pressure ulcer of right heel, stage 3: Secondary | ICD-10-CM | POA: Insufficient documentation

## 2016-04-24 DIAGNOSIS — I130 Hypertensive heart and chronic kidney disease with heart failure and stage 1 through stage 4 chronic kidney disease, or unspecified chronic kidney disease: Secondary | ICD-10-CM | POA: Insufficient documentation

## 2016-04-24 DIAGNOSIS — E11621 Type 2 diabetes mellitus with foot ulcer: Secondary | ICD-10-CM | POA: Diagnosis not present

## 2016-04-24 DIAGNOSIS — I429 Cardiomyopathy, unspecified: Secondary | ICD-10-CM | POA: Diagnosis not present

## 2016-04-24 DIAGNOSIS — M109 Gout, unspecified: Secondary | ICD-10-CM | POA: Insufficient documentation

## 2016-04-24 DIAGNOSIS — N183 Chronic kidney disease, stage 3 (moderate): Secondary | ICD-10-CM | POA: Diagnosis not present

## 2016-04-24 NOTE — Telephone Encounter (Signed)
[name] [name] would like orders to continue with Home Health aid 2 X /week.  Would also like an order to have PT come in and access pt to address transferring since pt does not get up and walk anymore.

## 2016-04-25 ENCOUNTER — Encounter: Payer: Self-pay | Admitting: *Deleted

## 2016-04-25 ENCOUNTER — Other Ambulatory Visit: Payer: Self-pay | Admitting: *Deleted

## 2016-04-25 NOTE — Telephone Encounter (Signed)
See below

## 2016-04-25 NOTE — Telephone Encounter (Signed)
It is OK to continue [place] aid frequency and PT at home as requested.  Thanks, BJ

## 2016-04-25 NOTE — Telephone Encounter (Signed)
Verbal orders given  

## 2016-04-25 NOTE — Patient Outreach (Signed)
Final transition of care call. Pt is doing well. He went to the wound clinic yesterday and it was the first time they did not have to debride anything. He and his wife do the dressings every other day and he goes to the wound clinic weekly.He has changed his cereal to Cherrios as we discussed last week. His glucose was 109 fasting today and 171 at lunch. He reports he is warm in his home and they do have food and water.  I have advised him this is my last call. I have asked him to keep my name and number for any future needs.  THN CM Care Plan Problem One   Flowsheet Row Most Recent Value  Care Plan Problem One  Pt has nonhealing foot wounds.  Role Documenting the Problem One  Care Management [place] for Problem One  Active  THN Long Term Goal (31-90 days)  Pt to follow instructions from wound center for wound healing over the next 31 days.  THN Long Term Goal Start Date  04/12/16  [facility] Long Term Goal Met Date  04/25/16  Interventions for Problem One Long Term Goal  Follow recommnedations.  THN CM Short Term Goal #1 (0-30 days)  Pt will attend wound clinic eval on 12/26.  THN CM Short Term Goal #1 Start Date  03/14/16  THN CM Short Term Goal #1 Met Date  04/18/16  THN CM Short Term Goal #2 (0-30 days)  Pt to advise NP of plan of care for possible surgery and contact NP for any problems during the next 31 days.  THN CM Short Term Goal #2 Start Date  04/12/16  [facility] CM Short Term Goal #2 Met Date  04/25/16  Interventions for Short Term Goal #2  Encouraged good foot care daily and careful vigilence of his wounds.    [facility] CM Care Plan Problem Two   Flowsheet Row Most Recent Value  Care Plan Problem Two  Diabetes control not at goal.  Role Documenting the Problem Two  Care Management Coordinator  THN CM Short Term Goal #1 (0-30 days)  Pt will report Glucise levels under 200 50% of the time over the next week.  THN CM Short Term Goal #1 Start Date  04/18/16  [facility] CM Short Term Goal #1  Met Date   04/25/16  Interventions for Short Term Goal #2   Encouraged continued vigilence to stay on a good diabetic diet.       Deloria Lair [facility] [place] [phone]

## 2016-04-26 DIAGNOSIS — L89623 Pressure ulcer of left heel, stage 3: Secondary | ICD-10-CM | POA: Diagnosis not present

## 2016-04-26 DIAGNOSIS — E11621 Type 2 diabetes mellitus with foot ulcer: Secondary | ICD-10-CM | POA: Diagnosis not present

## 2016-04-26 DIAGNOSIS — L89610 Pressure ulcer of right heel, unstageable: Secondary | ICD-10-CM | POA: Diagnosis not present

## 2016-04-26 DIAGNOSIS — I130 Hypertensive heart and chronic kidney disease with heart failure and stage 1 through stage 4 chronic kidney disease, or unspecified chronic kidney disease: Secondary | ICD-10-CM | POA: Diagnosis not present

## 2016-04-26 DIAGNOSIS — L97511 Non-pressure chronic ulcer of other part of right foot limited to breakdown of skin: Secondary | ICD-10-CM | POA: Diagnosis not present

## 2016-04-26 DIAGNOSIS — N312 Flaccid neuropathic bladder, not elsewhere classified: Secondary | ICD-10-CM | POA: Diagnosis not present

## 2016-04-26 DIAGNOSIS — L97521 Non-pressure chronic ulcer of other part of left foot limited to breakdown of skin: Secondary | ICD-10-CM | POA: Diagnosis not present

## 2016-04-30 DIAGNOSIS — E11621 Type 2 diabetes mellitus with foot ulcer: Secondary | ICD-10-CM | POA: Diagnosis not present

## 2016-04-30 DIAGNOSIS — L97521 Non-pressure chronic ulcer of other part of left foot limited to breakdown of skin: Secondary | ICD-10-CM | POA: Diagnosis not present

## 2016-04-30 DIAGNOSIS — L97511 Non-pressure chronic ulcer of other part of right foot limited to breakdown of skin: Secondary | ICD-10-CM | POA: Diagnosis not present

## 2016-04-30 DIAGNOSIS — L89610 Pressure ulcer of right heel, unstageable: Secondary | ICD-10-CM | POA: Diagnosis not present

## 2016-04-30 DIAGNOSIS — I130 Hypertensive heart and chronic kidney disease with heart failure and stage 1 through stage 4 chronic kidney disease, or unspecified chronic kidney disease: Secondary | ICD-10-CM | POA: Diagnosis not present

## 2016-04-30 DIAGNOSIS — L89623 Pressure ulcer of left heel, stage 3: Secondary | ICD-10-CM | POA: Diagnosis not present

## 2016-05-01 DIAGNOSIS — L89512 Pressure ulcer of right ankle, stage 2: Secondary | ICD-10-CM | POA: Diagnosis not present

## 2016-05-01 DIAGNOSIS — L89613 Pressure ulcer of right heel, stage 3: Secondary | ICD-10-CM | POA: Diagnosis not present

## 2016-05-01 DIAGNOSIS — L89623 Pressure ulcer of left heel, stage 3: Secondary | ICD-10-CM | POA: Diagnosis not present

## 2016-05-01 DIAGNOSIS — I25119 Atherosclerotic heart disease of native coronary artery with unspecified angina pectoris: Secondary | ICD-10-CM | POA: Diagnosis not present

## 2016-05-01 DIAGNOSIS — E11621 Type 2 diabetes mellitus with foot ulcer: Secondary | ICD-10-CM | POA: Diagnosis not present

## 2016-05-01 DIAGNOSIS — Z8673 Personal history of transient ischemic attack (TIA), and cerebral infarction without residual deficits: Secondary | ICD-10-CM | POA: Diagnosis not present

## 2016-05-01 DIAGNOSIS — I130 Hypertensive heart and chronic kidney disease with heart failure and stage 1 through stage 4 chronic kidney disease, or unspecified chronic kidney disease: Secondary | ICD-10-CM | POA: Diagnosis not present

## 2016-05-01 DIAGNOSIS — L89522 Pressure ulcer of left ankle, stage 2: Secondary | ICD-10-CM | POA: Diagnosis not present

## 2016-05-01 DIAGNOSIS — I5031 Acute diastolic (congestive) heart failure: Secondary | ICD-10-CM | POA: Diagnosis not present

## 2016-05-02 ENCOUNTER — Other Ambulatory Visit: Payer: Self-pay | Admitting: *Deleted

## 2016-05-02 NOTE — Patient Outreach (Signed)
Transition of care call. [name] [name] reports he had an episode of hypglycemic sxs this am. He says his glucose was 73 and he felt queasy. He drank some orange juice with some sugar in it and a tablespoon of peanut butter and then had breakfast not long after that. Her glucose rose to 230. He reports his glucose levels can range widely but usually the fasting glucose is within normal range.  He is still getting home health for wound care. He was seen in the wound clinic yesterday and his wounds were debrided again. He will follow up in another week.  I will call pt weekly for 2 more weeks and then graduate him from case management if he is stable.  Deloria Lair [facility] [place] [phone]

## 2016-05-03 DIAGNOSIS — L89623 Pressure ulcer of left heel, stage 3: Secondary | ICD-10-CM | POA: Diagnosis not present

## 2016-05-03 DIAGNOSIS — E11621 Type 2 diabetes mellitus with foot ulcer: Secondary | ICD-10-CM | POA: Diagnosis not present

## 2016-05-03 DIAGNOSIS — L97521 Non-pressure chronic ulcer of other part of left foot limited to breakdown of skin: Secondary | ICD-10-CM | POA: Diagnosis not present

## 2016-05-03 DIAGNOSIS — L97511 Non-pressure chronic ulcer of other part of right foot limited to breakdown of skin: Secondary | ICD-10-CM | POA: Diagnosis not present

## 2016-05-03 DIAGNOSIS — I130 Hypertensive heart and chronic kidney disease with heart failure and stage 1 through stage 4 chronic kidney disease, or unspecified chronic kidney disease: Secondary | ICD-10-CM | POA: Diagnosis not present

## 2016-05-03 DIAGNOSIS — L89610 Pressure ulcer of right heel, unstageable: Secondary | ICD-10-CM | POA: Diagnosis not present

## 2016-05-05 DIAGNOSIS — R278 Other lack of coordination: Secondary | ICD-10-CM | POA: Diagnosis not present

## 2016-05-05 DIAGNOSIS — I2510 Atherosclerotic heart disease of native coronary artery without angina pectoris: Secondary | ICD-10-CM | POA: Diagnosis not present

## 2016-05-05 DIAGNOSIS — M6281 Muscle weakness (generalized): Secondary | ICD-10-CM | POA: Diagnosis not present

## 2016-05-05 DIAGNOSIS — E1122 Type 2 diabetes mellitus with diabetic chronic kidney disease: Secondary | ICD-10-CM | POA: Diagnosis not present

## 2016-05-05 DIAGNOSIS — Z4789 Encounter for other orthopedic aftercare: Secondary | ICD-10-CM | POA: Diagnosis not present

## 2016-05-05 DIAGNOSIS — R262 Difficulty in walking, not elsewhere classified: Secondary | ICD-10-CM | POA: Diagnosis not present

## 2016-05-05 DIAGNOSIS — I5022 Chronic systolic (congestive) heart failure: Secondary | ICD-10-CM | POA: Diagnosis not present

## 2016-05-07 DIAGNOSIS — E11621 Type 2 diabetes mellitus with foot ulcer: Secondary | ICD-10-CM | POA: Diagnosis not present

## 2016-05-07 DIAGNOSIS — L97521 Non-pressure chronic ulcer of other part of left foot limited to breakdown of skin: Secondary | ICD-10-CM | POA: Diagnosis not present

## 2016-05-07 DIAGNOSIS — L97511 Non-pressure chronic ulcer of other part of right foot limited to breakdown of skin: Secondary | ICD-10-CM | POA: Diagnosis not present

## 2016-05-07 DIAGNOSIS — I130 Hypertensive heart and chronic kidney disease with heart failure and stage 1 through stage 4 chronic kidney disease, or unspecified chronic kidney disease: Secondary | ICD-10-CM | POA: Diagnosis not present

## 2016-05-07 DIAGNOSIS — L89610 Pressure ulcer of right heel, unstageable: Secondary | ICD-10-CM | POA: Diagnosis not present

## 2016-05-07 DIAGNOSIS — L89623 Pressure ulcer of left heel, stage 3: Secondary | ICD-10-CM | POA: Diagnosis not present

## 2016-05-08 DIAGNOSIS — I25119 Atherosclerotic heart disease of native coronary artery with unspecified angina pectoris: Secondary | ICD-10-CM | POA: Diagnosis not present

## 2016-05-08 DIAGNOSIS — I130 Hypertensive heart and chronic kidney disease with heart failure and stage 1 through stage 4 chronic kidney disease, or unspecified chronic kidney disease: Secondary | ICD-10-CM | POA: Diagnosis not present

## 2016-05-08 DIAGNOSIS — E11621 Type 2 diabetes mellitus with foot ulcer: Secondary | ICD-10-CM | POA: Diagnosis not present

## 2016-05-08 DIAGNOSIS — L89620 Pressure ulcer of left heel, unstageable: Secondary | ICD-10-CM | POA: Diagnosis not present

## 2016-05-08 DIAGNOSIS — L89512 Pressure ulcer of right ankle, stage 2: Secondary | ICD-10-CM | POA: Diagnosis not present

## 2016-05-08 DIAGNOSIS — L89613 Pressure ulcer of right heel, stage 3: Secondary | ICD-10-CM | POA: Diagnosis not present

## 2016-05-08 DIAGNOSIS — L89610 Pressure ulcer of right heel, unstageable: Secondary | ICD-10-CM | POA: Diagnosis not present

## 2016-05-08 DIAGNOSIS — L89522 Pressure ulcer of left ankle, stage 2: Secondary | ICD-10-CM | POA: Diagnosis not present

## 2016-05-08 DIAGNOSIS — L89623 Pressure ulcer of left heel, stage 3: Secondary | ICD-10-CM | POA: Diagnosis not present

## 2016-05-08 DIAGNOSIS — I5031 Acute diastolic (congestive) heart failure: Secondary | ICD-10-CM | POA: Diagnosis not present

## 2016-05-08 DIAGNOSIS — Z8673 Personal history of transient ischemic attack (TIA), and cerebral infarction without residual deficits: Secondary | ICD-10-CM | POA: Diagnosis not present

## 2016-05-08 DIAGNOSIS — R252 Cramp and spasm: Secondary | ICD-10-CM | POA: Diagnosis not present

## 2016-05-09 ENCOUNTER — Other Ambulatory Visit: Payer: Self-pay | Admitting: *Deleted

## 2016-05-09 NOTE — Patient Outreach (Signed)
Transition of care call. Pt reports he has been to the urologist this week and they are contemplating whether to put a suprapubic cathereter in to control his urinary output. He also went to the wound clinic and some of his wounds are healed now and a couple still needed to be debrided. He will return next week and hopefully these will be healed and he will be discharged.   We discussed his involvement with Dr. Missy Sabins and the HF clinic. I advised pt I wlll be starting to work with them next week. It will be highly likely that I may still have some involvement with him although my intention has been to call him one more time next week and then close his case. I will discuss this with Dr. Missy Sabins next week.  Deloria Lair [facility] [place] [phone]

## 2016-05-11 DIAGNOSIS — L97511 Non-pressure chronic ulcer of other part of right foot limited to breakdown of skin: Secondary | ICD-10-CM | POA: Diagnosis not present

## 2016-05-11 DIAGNOSIS — E11621 Type 2 diabetes mellitus with foot ulcer: Secondary | ICD-10-CM | POA: Diagnosis not present

## 2016-05-11 DIAGNOSIS — I130 Hypertensive heart and chronic kidney disease with heart failure and stage 1 through stage 4 chronic kidney disease, or unspecified chronic kidney disease: Secondary | ICD-10-CM | POA: Diagnosis not present

## 2016-05-11 DIAGNOSIS — L89623 Pressure ulcer of left heel, stage 3: Secondary | ICD-10-CM | POA: Diagnosis not present

## 2016-05-11 DIAGNOSIS — L89610 Pressure ulcer of right heel, unstageable: Secondary | ICD-10-CM | POA: Diagnosis not present

## 2016-05-11 DIAGNOSIS — L97521 Non-pressure chronic ulcer of other part of left foot limited to breakdown of skin: Secondary | ICD-10-CM | POA: Diagnosis not present

## 2016-05-14 DIAGNOSIS — L97511 Non-pressure chronic ulcer of other part of right foot limited to breakdown of skin: Secondary | ICD-10-CM | POA: Diagnosis not present

## 2016-05-14 DIAGNOSIS — L97521 Non-pressure chronic ulcer of other part of left foot limited to breakdown of skin: Secondary | ICD-10-CM | POA: Diagnosis not present

## 2016-05-14 DIAGNOSIS — E11621 Type 2 diabetes mellitus with foot ulcer: Secondary | ICD-10-CM | POA: Diagnosis not present

## 2016-05-14 DIAGNOSIS — L89623 Pressure ulcer of left heel, stage 3: Secondary | ICD-10-CM | POA: Diagnosis not present

## 2016-05-14 DIAGNOSIS — L89610 Pressure ulcer of right heel, unstageable: Secondary | ICD-10-CM | POA: Diagnosis not present

## 2016-05-14 DIAGNOSIS — I130 Hypertensive heart and chronic kidney disease with heart failure and stage 1 through stage 4 chronic kidney disease, or unspecified chronic kidney disease: Secondary | ICD-10-CM | POA: Diagnosis not present

## 2016-05-15 DIAGNOSIS — L89613 Pressure ulcer of right heel, stage 3: Secondary | ICD-10-CM | POA: Diagnosis not present

## 2016-05-15 DIAGNOSIS — E11621 Type 2 diabetes mellitus with foot ulcer: Secondary | ICD-10-CM | POA: Diagnosis not present

## 2016-05-15 DIAGNOSIS — L89623 Pressure ulcer of left heel, stage 3: Secondary | ICD-10-CM | POA: Diagnosis not present

## 2016-05-15 DIAGNOSIS — L89512 Pressure ulcer of right ankle, stage 2: Secondary | ICD-10-CM | POA: Diagnosis not present

## 2016-05-15 DIAGNOSIS — I130 Hypertensive heart and chronic kidney disease with heart failure and stage 1 through stage 4 chronic kidney disease, or unspecified chronic kidney disease: Secondary | ICD-10-CM | POA: Diagnosis not present

## 2016-05-15 DIAGNOSIS — I25119 Atherosclerotic heart disease of native coronary artery with unspecified angina pectoris: Secondary | ICD-10-CM | POA: Diagnosis not present

## 2016-05-15 DIAGNOSIS — I5031 Acute diastolic (congestive) heart failure: Secondary | ICD-10-CM | POA: Diagnosis not present

## 2016-05-15 DIAGNOSIS — Z8673 Personal history of transient ischemic attack (TIA), and cerebral infarction without residual deficits: Secondary | ICD-10-CM | POA: Diagnosis not present

## 2016-05-15 DIAGNOSIS — L89522 Pressure ulcer of left ankle, stage 2: Secondary | ICD-10-CM | POA: Diagnosis not present

## 2016-05-16 ENCOUNTER — Encounter: Payer: Self-pay | Admitting: *Deleted

## 2016-05-16 ENCOUNTER — Other Ambulatory Visit: Payer: Self-pay | Admitting: *Deleted

## 2016-05-16 NOTE — Patient Outreach (Signed)
Final Transition of care call. [name] [name] is doing well. He did see the wound specialist and they note continued improvement in his foot wounds but he does have to go again next week. He will also be seeing his urologist to discuss the possiblity of putting in a suprapubic catheter, since he is wheelchair bound.  I have asked if there are any further items I could assist him with and he says he doesn't have a thing. He reports he is well cared for and attended.  I have advised him I will close his case as he has met all his goals. I have reinforced that if he needs to talk to a nurse in the future he can call on me.  I will send him and Dr. [name] closure letters.  Deloria Lair [facility] [place] [phone]

## 2016-05-17 DIAGNOSIS — L89623 Pressure ulcer of left heel, stage 3: Secondary | ICD-10-CM | POA: Diagnosis not present

## 2016-05-17 DIAGNOSIS — I130 Hypertensive heart and chronic kidney disease with heart failure and stage 1 through stage 4 chronic kidney disease, or unspecified chronic kidney disease: Secondary | ICD-10-CM | POA: Diagnosis not present

## 2016-05-17 DIAGNOSIS — L97511 Non-pressure chronic ulcer of other part of right foot limited to breakdown of skin: Secondary | ICD-10-CM | POA: Diagnosis not present

## 2016-05-17 DIAGNOSIS — E11621 Type 2 diabetes mellitus with foot ulcer: Secondary | ICD-10-CM | POA: Diagnosis not present

## 2016-05-17 DIAGNOSIS — L89610 Pressure ulcer of right heel, unstageable: Secondary | ICD-10-CM | POA: Diagnosis not present

## 2016-05-17 DIAGNOSIS — L97521 Non-pressure chronic ulcer of other part of left foot limited to breakdown of skin: Secondary | ICD-10-CM | POA: Diagnosis not present

## 2016-05-22 DIAGNOSIS — L97522 Non-pressure chronic ulcer of other part of left foot with fat layer exposed: Secondary | ICD-10-CM | POA: Diagnosis not present

## 2016-05-22 DIAGNOSIS — N1330 Unspecified hydronephrosis: Secondary | ICD-10-CM | POA: Diagnosis not present

## 2016-05-22 DIAGNOSIS — Z8673 Personal history of transient ischemic attack (TIA), and cerebral infarction without residual deficits: Secondary | ICD-10-CM | POA: Diagnosis not present

## 2016-05-22 DIAGNOSIS — I25119 Atherosclerotic heart disease of native coronary artery with unspecified angina pectoris: Secondary | ICD-10-CM | POA: Diagnosis not present

## 2016-05-22 DIAGNOSIS — L89512 Pressure ulcer of right ankle, stage 2: Secondary | ICD-10-CM | POA: Diagnosis not present

## 2016-05-22 DIAGNOSIS — I130 Hypertensive heart and chronic kidney disease with heart failure and stage 1 through stage 4 chronic kidney disease, or unspecified chronic kidney disease: Secondary | ICD-10-CM | POA: Diagnosis not present

## 2016-05-22 DIAGNOSIS — L89623 Pressure ulcer of left heel, stage 3: Secondary | ICD-10-CM | POA: Diagnosis not present

## 2016-05-22 DIAGNOSIS — L89620 Pressure ulcer of left heel, unstageable: Secondary | ICD-10-CM | POA: Diagnosis not present

## 2016-05-22 DIAGNOSIS — E11621 Type 2 diabetes mellitus with foot ulcer: Secondary | ICD-10-CM | POA: Diagnosis not present

## 2016-05-22 DIAGNOSIS — L89610 Pressure ulcer of right heel, unstageable: Secondary | ICD-10-CM | POA: Diagnosis not present

## 2016-05-22 DIAGNOSIS — L89522 Pressure ulcer of left ankle, stage 2: Secondary | ICD-10-CM | POA: Diagnosis not present

## 2016-05-22 DIAGNOSIS — N312 Flaccid neuropathic bladder, not elsewhere classified: Secondary | ICD-10-CM | POA: Diagnosis not present

## 2016-05-22 DIAGNOSIS — L89613 Pressure ulcer of right heel, stage 3: Secondary | ICD-10-CM | POA: Diagnosis not present

## 2016-05-22 DIAGNOSIS — I5031 Acute diastolic (congestive) heart failure: Secondary | ICD-10-CM | POA: Diagnosis not present

## 2016-05-23 DIAGNOSIS — I130 Hypertensive heart and chronic kidney disease with heart failure and stage 1 through stage 4 chronic kidney disease, or unspecified chronic kidney disease: Secondary | ICD-10-CM | POA: Diagnosis not present

## 2016-05-23 DIAGNOSIS — L97521 Non-pressure chronic ulcer of other part of left foot limited to breakdown of skin: Secondary | ICD-10-CM | POA: Diagnosis not present

## 2016-05-23 DIAGNOSIS — L97511 Non-pressure chronic ulcer of other part of right foot limited to breakdown of skin: Secondary | ICD-10-CM | POA: Diagnosis not present

## 2016-05-23 DIAGNOSIS — L89610 Pressure ulcer of right heel, unstageable: Secondary | ICD-10-CM | POA: Diagnosis not present

## 2016-05-23 DIAGNOSIS — E11621 Type 2 diabetes mellitus with foot ulcer: Secondary | ICD-10-CM | POA: Diagnosis not present

## 2016-05-23 DIAGNOSIS — L89623 Pressure ulcer of left heel, stage 3: Secondary | ICD-10-CM | POA: Diagnosis not present

## 2016-05-24 ENCOUNTER — Ambulatory Visit (HOSPITAL_COMMUNITY)
Admission: RE | Admit: 2016-05-24 | Discharge: 2016-05-24 | Disposition: A | Discharge status: Home / Self Care | Payer: Commercial Managed Care - HMO | Source: Ambulatory Visit | Attending: Internal Medicine | Admitting: Internal Medicine

## 2016-05-24 ENCOUNTER — Ambulatory Visit (INDEPENDENT_AMBULATORY_CARE_PROVIDER_SITE_OTHER): Payer: Medicare HMO | Admitting: *Deleted

## 2016-05-24 ENCOUNTER — Encounter (HOSPITAL_COMMUNITY): Payer: Self-pay | Admitting: Internal Medicine

## 2016-05-24 VITALS — BP 116/72 | HR 73

## 2016-05-24 DIAGNOSIS — I130 Hypertensive heart and chronic kidney disease with heart failure and stage 1 through stage 4 chronic kidney disease, or unspecified chronic kidney disease: Secondary | ICD-10-CM | POA: Insufficient documentation

## 2016-05-24 DIAGNOSIS — I2510 Atherosclerotic heart disease of native coronary artery without angina pectoris: Secondary | ICD-10-CM | POA: Insufficient documentation

## 2016-05-24 DIAGNOSIS — I472 Ventricular tachycardia, unspecified: Secondary | ICD-10-CM

## 2016-05-24 DIAGNOSIS — Z9889 Other specified postprocedural states: Secondary | ICD-10-CM | POA: Insufficient documentation

## 2016-05-24 DIAGNOSIS — I5042 Chronic combined systolic (congestive) and diastolic (congestive) heart failure: Secondary | ICD-10-CM | POA: Insufficient documentation

## 2016-05-24 DIAGNOSIS — R2681 Unsteadiness on feet: Secondary | ICD-10-CM

## 2016-05-24 DIAGNOSIS — N183 Chronic kidney disease, stage 3 unspecified: Secondary | ICD-10-CM

## 2016-05-24 DIAGNOSIS — I25810 Atherosclerosis of coronary artery bypass graft(s) without angina pectoris: Secondary | ICD-10-CM

## 2016-05-24 DIAGNOSIS — Z5189 Encounter for other specified aftercare: Secondary | ICD-10-CM | POA: Diagnosis not present

## 2016-05-24 DIAGNOSIS — E1122 Type 2 diabetes mellitus with diabetic chronic kidney disease: Secondary | ICD-10-CM | POA: Diagnosis not present

## 2016-05-24 DIAGNOSIS — I10 Essential (primary) hypertension: Secondary | ICD-10-CM

## 2016-05-24 DIAGNOSIS — N184 Chronic kidney disease, stage 4 (severe): Secondary | ICD-10-CM | POA: Insufficient documentation

## 2016-05-24 DIAGNOSIS — N320 Bladder-neck obstruction: Secondary | ICD-10-CM | POA: Diagnosis not present

## 2016-05-24 LAB — BASIC METABOLIC PANEL
ANION GAP: 6 (ref 5–15)
BUN: 36 mg/dL — ABNORMAL HIGH (ref 6–20)
CHLORIDE: 110 mmol/L (ref 101–111)
CO2: 25 mmol/L (ref 22–32)
Calcium: 8.5 mg/dL — ABNORMAL LOW (ref 8.9–10.3)
Creatinine, Ser: 1.91 mg/dL — ABNORMAL HIGH (ref 0.61–1.24)
GFR calc non Af Amer: 34 mL/min — ABNORMAL LOW (ref >60)
GFR, EST AFRICAN AMERICAN: 40 mL/min — AB (ref >60)
Glucose, Bld: 56 mg/dL — ABNORMAL LOW (ref 65–99)
POTASSIUM: 4.6 mmol/L (ref 3.5–5.1)
SODIUM: 141 mmol/L (ref 135–145)

## 2016-05-24 MED ORDER — LOSARTAN POTASSIUM 25 MG PO TABS: 12.5000 mg | ORAL_TABLET | Freq: Every day | ORAL | 2 refills | Status: DC

## 2016-05-24 MED ORDER — AMIODARONE HCL 200 MG PO TABS: 200.0000 mg | ORAL_TABLET | Freq: Every day | ORAL | 3 refills | Status: DC
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
## 2016-05-24 NOTE — Patient Instructions (Addendum)
Labs today (will call for abnormal results, otherwise no news is good news)  Decrease Amiodarone to 200 mg Once Daily  START taking losartan 12.5 mg (0.5 Tablet) once daily  Labs in 2 weeks  Follow up in 6-8 weeks

## 2016-05-25 ENCOUNTER — Other Ambulatory Visit (HOSPITAL_COMMUNITY): Payer: Self-pay | Admitting: Internal Medicine

## 2016-05-25 DIAGNOSIS — E162 Hypoglycemia, unspecified: Secondary | ICD-10-CM | POA: Diagnosis not present

## 2016-05-25 DIAGNOSIS — E161 Other hypoglycemia: Secondary | ICD-10-CM | POA: Diagnosis not present

## 2016-05-28 ENCOUNTER — Telehealth (HOSPITAL_COMMUNITY): Payer: Self-pay | Admitting: *Deleted

## 2016-05-28 DIAGNOSIS — I5042 Chronic combined systolic (congestive) and diastolic (congestive) heart failure: Secondary | ICD-10-CM

## 2016-05-28 NOTE — Telephone Encounter (Signed)
Notes Recorded by Kennieth Rad, RN on 05/28/2016 at 10:43 AM EST Spoke with patient and he is agreeable to come in for labs in 2 weeks. Appointment made, lab order placed. ------  Notes Recorded by Jolaine Artist, MD on 05/27/2016 at 5:42 PM EST Repeat 2 weeks.

## 2016-05-28 NOTE — Progress Notes (Signed)
Remote ICD transmission.   

## 2016-05-29 ENCOUNTER — Encounter (HOSPITAL_BASED_OUTPATIENT_CLINIC_OR_DEPARTMENT_OTHER): Payer: Commercial Managed Care - HMO | Attending: Surgery

## 2016-05-29 DIAGNOSIS — I129 Hypertensive chronic kidney disease with stage 1 through stage 4 chronic kidney disease, or unspecified chronic kidney disease: Secondary | ICD-10-CM | POA: Insufficient documentation

## 2016-05-29 DIAGNOSIS — I255 Ischemic cardiomyopathy: Secondary | ICD-10-CM | POA: Diagnosis not present

## 2016-05-29 DIAGNOSIS — N183 Chronic kidney disease, stage 3 (moderate): Secondary | ICD-10-CM | POA: Insufficient documentation

## 2016-05-29 DIAGNOSIS — I25119 Atherosclerotic heart disease of native coronary artery with unspecified angina pectoris: Secondary | ICD-10-CM | POA: Diagnosis not present

## 2016-05-29 DIAGNOSIS — Z8673 Personal history of transient ischemic attack (TIA), and cerebral infarction without residual deficits: Secondary | ICD-10-CM | POA: Diagnosis not present

## 2016-05-29 DIAGNOSIS — L89893 Pressure ulcer of other site, stage 3: Secondary | ICD-10-CM | POA: Insufficient documentation

## 2016-05-29 DIAGNOSIS — L89620 Pressure ulcer of left heel, unstageable: Secondary | ICD-10-CM | POA: Diagnosis not present

## 2016-05-29 DIAGNOSIS — E1140 Type 2 diabetes mellitus with diabetic neuropathy, unspecified: Secondary | ICD-10-CM | POA: Diagnosis not present

## 2016-05-29 DIAGNOSIS — Z95810 Presence of automatic (implantable) cardiac defibrillator: Secondary | ICD-10-CM | POA: Insufficient documentation

## 2016-05-29 DIAGNOSIS — I252 Old myocardial infarction: Secondary | ICD-10-CM | POA: Insufficient documentation

## 2016-05-29 DIAGNOSIS — Y838 Other surgical procedures as the cause of abnormal reaction of the patient, or of later complication, without mention of misadventure at the time of the procedure: Secondary | ICD-10-CM | POA: Insufficient documentation

## 2016-05-29 DIAGNOSIS — E11621 Type 2 diabetes mellitus with foot ulcer: Secondary | ICD-10-CM | POA: Diagnosis not present

## 2016-05-29 DIAGNOSIS — L89610 Pressure ulcer of right heel, unstageable: Secondary | ICD-10-CM | POA: Insufficient documentation

## 2016-05-29 DIAGNOSIS — E1122 Type 2 diabetes mellitus with diabetic chronic kidney disease: Secondary | ICD-10-CM | POA: Diagnosis not present

## 2016-05-29 DIAGNOSIS — L89892 Pressure ulcer of other site, stage 2: Secondary | ICD-10-CM | POA: Diagnosis not present

## 2016-05-29 DIAGNOSIS — T8130XA Disruption of wound, unspecified, initial encounter: Secondary | ICD-10-CM | POA: Diagnosis not present

## 2016-05-29 DIAGNOSIS — G4730 Sleep apnea, unspecified: Secondary | ICD-10-CM | POA: Insufficient documentation

## 2016-05-30 ENCOUNTER — Encounter: Payer: Self-pay | Admitting: Cardiology

## 2016-05-30 DIAGNOSIS — E11621 Type 2 diabetes mellitus with foot ulcer: Secondary | ICD-10-CM | POA: Diagnosis not present

## 2016-05-30 DIAGNOSIS — L89623 Pressure ulcer of left heel, stage 3: Secondary | ICD-10-CM | POA: Diagnosis not present

## 2016-05-30 DIAGNOSIS — L89610 Pressure ulcer of right heel, unstageable: Secondary | ICD-10-CM | POA: Diagnosis not present

## 2016-05-30 DIAGNOSIS — L97521 Non-pressure chronic ulcer of other part of left foot limited to breakdown of skin: Secondary | ICD-10-CM | POA: Diagnosis not present

## 2016-05-30 DIAGNOSIS — I130 Hypertensive heart and chronic kidney disease with heart failure and stage 1 through stage 4 chronic kidney disease, or unspecified chronic kidney disease: Secondary | ICD-10-CM | POA: Diagnosis not present

## 2016-05-30 DIAGNOSIS — L97511 Non-pressure chronic ulcer of other part of right foot limited to breakdown of skin: Secondary | ICD-10-CM | POA: Diagnosis not present

## 2016-05-31 ENCOUNTER — Other Ambulatory Visit: Payer: Self-pay | Admitting: Internal Medicine

## 2016-06-05 DIAGNOSIS — I25119 Atherosclerotic heart disease of native coronary artery with unspecified angina pectoris: Secondary | ICD-10-CM | POA: Diagnosis not present

## 2016-06-05 DIAGNOSIS — L89610 Pressure ulcer of right heel, unstageable: Secondary | ICD-10-CM | POA: Diagnosis not present

## 2016-06-05 DIAGNOSIS — Z4789 Encounter for other orthopedic aftercare: Secondary | ICD-10-CM | POA: Diagnosis not present

## 2016-06-05 DIAGNOSIS — L89893 Pressure ulcer of other site, stage 3: Secondary | ICD-10-CM | POA: Diagnosis not present

## 2016-06-05 DIAGNOSIS — E1122 Type 2 diabetes mellitus with diabetic chronic kidney disease: Secondary | ICD-10-CM | POA: Diagnosis not present

## 2016-06-05 DIAGNOSIS — T8130XA Disruption of wound, unspecified, initial encounter: Secondary | ICD-10-CM | POA: Diagnosis not present

## 2016-06-05 DIAGNOSIS — L89620 Pressure ulcer of left heel, unstageable: Secondary | ICD-10-CM | POA: Diagnosis not present

## 2016-06-05 DIAGNOSIS — M6281 Muscle weakness (generalized): Secondary | ICD-10-CM | POA: Diagnosis not present

## 2016-06-05 DIAGNOSIS — I5022 Chronic systolic (congestive) heart failure: Secondary | ICD-10-CM | POA: Diagnosis not present

## 2016-06-05 DIAGNOSIS — R278 Other lack of coordination: Secondary | ICD-10-CM | POA: Diagnosis not present

## 2016-06-05 DIAGNOSIS — I129 Hypertensive chronic kidney disease with stage 1 through stage 4 chronic kidney disease, or unspecified chronic kidney disease: Secondary | ICD-10-CM | POA: Diagnosis not present

## 2016-06-05 DIAGNOSIS — L89892 Pressure ulcer of other site, stage 2: Secondary | ICD-10-CM | POA: Diagnosis not present

## 2016-06-05 DIAGNOSIS — T8131XA Disruption of external operation (surgical) wound, not elsewhere classified, initial encounter: Secondary | ICD-10-CM | POA: Diagnosis not present

## 2016-06-05 DIAGNOSIS — I2510 Atherosclerotic heart disease of native coronary artery without angina pectoris: Secondary | ICD-10-CM | POA: Diagnosis not present

## 2016-06-05 DIAGNOSIS — E11621 Type 2 diabetes mellitus with foot ulcer: Secondary | ICD-10-CM | POA: Diagnosis not present

## 2016-06-05 DIAGNOSIS — R262 Difficulty in walking, not elsewhere classified: Secondary | ICD-10-CM | POA: Diagnosis not present

## 2016-06-05 LAB — CUP PACEART REMOTE DEVICE CHECK
Battery Remaining Longevity: 70 mo
Battery Remaining Percentage: 68 %
Battery Voltage: 2.96 V
Brady Statistic RV Percent Paced: 1 %
Date Time Interrogation Session: 20180201184733
HIGH POWER IMPEDANCE MEASURED VALUE: 38 Ohm
HighPow Impedance: 37 Ohm
Implantable Lead Model: 7001
Lead Channel Impedance Value: 400 Ohm
Lead Channel Pacing Threshold Amplitude: 0.75 V
Lead Channel Sensing Intrinsic Amplitude: 12 mV
Lead Channel Setting Sensing Sensitivity: 0.5 mV
MDC IDC LEAD IMPLANT DT: 20070518
MDC IDC LEAD LOCATION: 753860
MDC IDC MSMT LEADCHNL RV PACING THRESHOLD PULSEWIDTH: 0.5 ms
MDC IDC PG IMPLANT DT: 20140702
MDC IDC PG SERIAL: 1109991
MDC IDC SET LEADCHNL RV PACING AMPLITUDE: 2.5 V
MDC IDC SET LEADCHNL RV PACING PULSEWIDTH: 0.5 ms

## 2016-06-06 DIAGNOSIS — L97521 Non-pressure chronic ulcer of other part of left foot limited to breakdown of skin: Secondary | ICD-10-CM | POA: Diagnosis not present

## 2016-06-06 DIAGNOSIS — E11621 Type 2 diabetes mellitus with foot ulcer: Secondary | ICD-10-CM | POA: Diagnosis not present

## 2016-06-06 DIAGNOSIS — L89623 Pressure ulcer of left heel, stage 3: Secondary | ICD-10-CM | POA: Diagnosis not present

## 2016-06-06 DIAGNOSIS — I130 Hypertensive heart and chronic kidney disease with heart failure and stage 1 through stage 4 chronic kidney disease, or unspecified chronic kidney disease: Secondary | ICD-10-CM | POA: Diagnosis not present

## 2016-06-06 DIAGNOSIS — L89610 Pressure ulcer of right heel, unstageable: Secondary | ICD-10-CM | POA: Diagnosis not present

## 2016-06-06 DIAGNOSIS — L97511 Non-pressure chronic ulcer of other part of right foot limited to breakdown of skin: Secondary | ICD-10-CM | POA: Diagnosis not present

## 2016-06-07 ENCOUNTER — Other Ambulatory Visit (HOSPITAL_COMMUNITY): Payer: Commercial Managed Care - HMO

## 2016-06-07 DIAGNOSIS — L97521 Non-pressure chronic ulcer of other part of left foot limited to breakdown of skin: Secondary | ICD-10-CM | POA: Diagnosis not present

## 2016-06-07 DIAGNOSIS — L89623 Pressure ulcer of left heel, stage 3: Secondary | ICD-10-CM | POA: Diagnosis not present

## 2016-06-07 DIAGNOSIS — L89610 Pressure ulcer of right heel, unstageable: Secondary | ICD-10-CM | POA: Diagnosis not present

## 2016-06-07 DIAGNOSIS — L97511 Non-pressure chronic ulcer of other part of right foot limited to breakdown of skin: Secondary | ICD-10-CM | POA: Diagnosis not present

## 2016-06-07 DIAGNOSIS — I130 Hypertensive heart and chronic kidney disease with heart failure and stage 1 through stage 4 chronic kidney disease, or unspecified chronic kidney disease: Secondary | ICD-10-CM | POA: Diagnosis not present

## 2016-06-07 DIAGNOSIS — E11621 Type 2 diabetes mellitus with foot ulcer: Secondary | ICD-10-CM | POA: Diagnosis not present

## 2016-06-12 ENCOUNTER — Ambulatory Visit (HOSPITAL_COMMUNITY)
Admission: RE | Admit: 2016-06-12 | Discharge: 2016-06-12 | Disposition: A | Discharge status: Home / Self Care | Payer: Medicare HMO | Source: Ambulatory Visit | Attending: Cardiology | Admitting: Cardiology

## 2016-06-12 DIAGNOSIS — I5042 Chronic combined systolic (congestive) and diastolic (congestive) heart failure: Secondary | ICD-10-CM | POA: Diagnosis not present

## 2016-06-12 DIAGNOSIS — L89892 Pressure ulcer of other site, stage 2: Secondary | ICD-10-CM | POA: Diagnosis not present

## 2016-06-12 DIAGNOSIS — L89893 Pressure ulcer of other site, stage 3: Secondary | ICD-10-CM | POA: Diagnosis not present

## 2016-06-12 DIAGNOSIS — E1122 Type 2 diabetes mellitus with diabetic chronic kidney disease: Secondary | ICD-10-CM | POA: Diagnosis not present

## 2016-06-12 DIAGNOSIS — L89620 Pressure ulcer of left heel, unstageable: Secondary | ICD-10-CM | POA: Diagnosis not present

## 2016-06-12 DIAGNOSIS — I129 Hypertensive chronic kidney disease with stage 1 through stage 4 chronic kidney disease, or unspecified chronic kidney disease: Secondary | ICD-10-CM | POA: Diagnosis not present

## 2016-06-12 DIAGNOSIS — T8130XA Disruption of wound, unspecified, initial encounter: Secondary | ICD-10-CM | POA: Diagnosis not present

## 2016-06-12 DIAGNOSIS — T8131XA Disruption of external operation (surgical) wound, not elsewhere classified, initial encounter: Secondary | ICD-10-CM | POA: Diagnosis not present

## 2016-06-12 DIAGNOSIS — L89610 Pressure ulcer of right heel, unstageable: Secondary | ICD-10-CM | POA: Diagnosis not present

## 2016-06-12 DIAGNOSIS — E11621 Type 2 diabetes mellitus with foot ulcer: Secondary | ICD-10-CM | POA: Diagnosis not present

## 2016-06-12 DIAGNOSIS — I25119 Atherosclerotic heart disease of native coronary artery with unspecified angina pectoris: Secondary | ICD-10-CM | POA: Diagnosis not present

## 2016-06-12 LAB — BASIC METABOLIC PANEL
ANION GAP: 9 (ref 5–15)
BUN: 37 mg/dL — ABNORMAL HIGH (ref 6–20)
CHLORIDE: 106 mmol/L (ref 101–111)
CO2: 25 mmol/L (ref 22–32)
Calcium: 8.8 mg/dL — ABNORMAL LOW (ref 8.9–10.3)
Creatinine, Ser: 2.02 mg/dL — ABNORMAL HIGH (ref 0.61–1.24)
GFR calc Af Amer: 37 mL/min — ABNORMAL LOW (ref >60)
GFR, EST NON AFRICAN AMERICAN: 32 mL/min — AB (ref >60)
GLUCOSE: 179 mg/dL — AB (ref 65–99)
POTASSIUM: 4.7 mmol/L (ref 3.5–5.1)
Sodium: 140 mmol/L (ref 135–145)

## 2016-06-13 DIAGNOSIS — I130 Hypertensive heart and chronic kidney disease with heart failure and stage 1 through stage 4 chronic kidney disease, or unspecified chronic kidney disease: Secondary | ICD-10-CM | POA: Diagnosis not present

## 2016-06-13 DIAGNOSIS — L89610 Pressure ulcer of right heel, unstageable: Secondary | ICD-10-CM | POA: Diagnosis not present

## 2016-06-13 DIAGNOSIS — L97511 Non-pressure chronic ulcer of other part of right foot limited to breakdown of skin: Secondary | ICD-10-CM | POA: Diagnosis not present

## 2016-06-13 DIAGNOSIS — L89623 Pressure ulcer of left heel, stage 3: Secondary | ICD-10-CM | POA: Diagnosis not present

## 2016-06-13 DIAGNOSIS — E11621 Type 2 diabetes mellitus with foot ulcer: Secondary | ICD-10-CM | POA: Diagnosis not present

## 2016-06-13 DIAGNOSIS — L97521 Non-pressure chronic ulcer of other part of left foot limited to breakdown of skin: Secondary | ICD-10-CM | POA: Diagnosis not present

## 2016-06-14 DIAGNOSIS — I130 Hypertensive heart and chronic kidney disease with heart failure and stage 1 through stage 4 chronic kidney disease, or unspecified chronic kidney disease: Secondary | ICD-10-CM | POA: Diagnosis not present

## 2016-06-14 DIAGNOSIS — L97511 Non-pressure chronic ulcer of other part of right foot limited to breakdown of skin: Secondary | ICD-10-CM | POA: Diagnosis not present

## 2016-06-14 DIAGNOSIS — L97521 Non-pressure chronic ulcer of other part of left foot limited to breakdown of skin: Secondary | ICD-10-CM | POA: Diagnosis not present

## 2016-06-14 DIAGNOSIS — L89623 Pressure ulcer of left heel, stage 3: Secondary | ICD-10-CM | POA: Diagnosis not present

## 2016-06-14 DIAGNOSIS — L89610 Pressure ulcer of right heel, unstageable: Secondary | ICD-10-CM | POA: Diagnosis not present

## 2016-06-14 DIAGNOSIS — E11621 Type 2 diabetes mellitus with foot ulcer: Secondary | ICD-10-CM | POA: Diagnosis not present

## 2016-06-19 DIAGNOSIS — T8130XA Disruption of wound, unspecified, initial encounter: Secondary | ICD-10-CM | POA: Diagnosis not present

## 2016-06-19 DIAGNOSIS — E1122 Type 2 diabetes mellitus with diabetic chronic kidney disease: Secondary | ICD-10-CM | POA: Diagnosis not present

## 2016-06-19 DIAGNOSIS — E11621 Type 2 diabetes mellitus with foot ulcer: Secondary | ICD-10-CM | POA: Diagnosis not present

## 2016-06-19 DIAGNOSIS — N312 Flaccid neuropathic bladder, not elsewhere classified: Secondary | ICD-10-CM | POA: Diagnosis not present

## 2016-06-19 DIAGNOSIS — I129 Hypertensive chronic kidney disease with stage 1 through stage 4 chronic kidney disease, or unspecified chronic kidney disease: Secondary | ICD-10-CM | POA: Diagnosis not present

## 2016-06-19 DIAGNOSIS — L89620 Pressure ulcer of left heel, unstageable: Secondary | ICD-10-CM | POA: Diagnosis not present

## 2016-06-19 DIAGNOSIS — L89892 Pressure ulcer of other site, stage 2: Secondary | ICD-10-CM | POA: Diagnosis not present

## 2016-06-19 DIAGNOSIS — L89610 Pressure ulcer of right heel, unstageable: Secondary | ICD-10-CM | POA: Diagnosis not present

## 2016-06-19 DIAGNOSIS — I25119 Atherosclerotic heart disease of native coronary artery with unspecified angina pectoris: Secondary | ICD-10-CM | POA: Diagnosis not present

## 2016-06-19 DIAGNOSIS — L89893 Pressure ulcer of other site, stage 3: Secondary | ICD-10-CM | POA: Diagnosis not present

## 2016-06-21 DIAGNOSIS — L97511 Non-pressure chronic ulcer of other part of right foot limited to breakdown of skin: Secondary | ICD-10-CM | POA: Diagnosis not present

## 2016-06-21 DIAGNOSIS — I130 Hypertensive heart and chronic kidney disease with heart failure and stage 1 through stage 4 chronic kidney disease, or unspecified chronic kidney disease: Secondary | ICD-10-CM | POA: Diagnosis not present

## 2016-06-21 DIAGNOSIS — L89623 Pressure ulcer of left heel, stage 3: Secondary | ICD-10-CM | POA: Diagnosis not present

## 2016-06-21 DIAGNOSIS — E11621 Type 2 diabetes mellitus with foot ulcer: Secondary | ICD-10-CM | POA: Diagnosis not present

## 2016-06-21 DIAGNOSIS — L97521 Non-pressure chronic ulcer of other part of left foot limited to breakdown of skin: Secondary | ICD-10-CM | POA: Diagnosis not present

## 2016-06-21 DIAGNOSIS — L89610 Pressure ulcer of right heel, unstageable: Secondary | ICD-10-CM | POA: Diagnosis not present

## 2016-06-25 DIAGNOSIS — L89613 Pressure ulcer of right heel, stage 3: Secondary | ICD-10-CM | POA: Diagnosis not present

## 2016-06-25 DIAGNOSIS — L97521 Non-pressure chronic ulcer of other part of left foot limited to breakdown of skin: Secondary | ICD-10-CM | POA: Diagnosis not present

## 2016-06-25 DIAGNOSIS — L89623 Pressure ulcer of left heel, stage 3: Secondary | ICD-10-CM | POA: Diagnosis not present

## 2016-06-25 DIAGNOSIS — E11621 Type 2 diabetes mellitus with foot ulcer: Secondary | ICD-10-CM | POA: Diagnosis not present

## 2016-06-25 DIAGNOSIS — L97511 Non-pressure chronic ulcer of other part of right foot limited to breakdown of skin: Secondary | ICD-10-CM | POA: Diagnosis not present

## 2016-06-25 DIAGNOSIS — I130 Hypertensive heart and chronic kidney disease with heart failure and stage 1 through stage 4 chronic kidney disease, or unspecified chronic kidney disease: Secondary | ICD-10-CM | POA: Diagnosis not present

## 2016-06-25 NOTE — Progress Notes (Signed)
HPI:   [name] [name] is a 71 y.o. male, who is here today with daughter and granddaughter to follow on some chronic medical problems.  He was last seen here on 03/27/16 for hospital follow up.  CAD and CHF: Since his last OV he has followed with cardiologists, Dr Haroldine Laws. Next appt 07/19/16.   Hypertension:  Currently on Losartan 25 mg daily,added recently.He is also on Coreg 3.125 mg bid   Atrial fib,Aiodaron decreased since his last OV. He is taking medications as instructed, no side effects reported.  He has not noted unusual headache, visual changes, exertional chest pain, worsening dyspnea,  focal weakness, or worsening edema.  + CKD III. He has not noted gross hematuria or foam in urine. Wearing urine catch, Hx of neurogenic bladder. He is following with urologists every 6 months and has urine cath change monthly.  He is on Dexmadex 20 mg q 2 days.   Lab Results  Component Value Date   CREATININE 2.02 (H) 06/12/2016   BUN 37 (H) 06/12/2016   NA 140 06/12/2016   K 4.7 06/12/2016   CL 106 06/12/2016   CO2 25 06/12/2016     Diabetes Mellitus II:   Currently on Lantus 10 U at bedtime (10 pm-12 midnight) and Novalog sliding scale.   Checking BS's : FG 60-100's,post prandial 160's-170's. Hypoglycemia: A few times he has had FG in the mid 50's and 60's.According to daughter,she checks his BS because he seems to be "out of it",feels better after eating something. About 2 weeks ago daughter called EMS because BS in the low 50's,,he was confused. IV glucose was given (per daughter report). He started eating a snack at bedtime:peanut crackers,ice cream among some in order to prevent hypoglycemia.   He denies abdominal pain, nausea, vomiting, polydipsia, polyuria, or polyphagia.  + Hx of peripheral neuropathy. + Decubitus ulcer around ankles and heels,he is following with wound clinic and [facility] service for wound care at home. According to daughter wounds are  slowly getting better. He is not ambulatory, he is in his wheel chair or chair at home most of the time.He has a walker in case he needs it.He has not had falls since his last OV.  Nurse aid once per week to help with ADL's, bath and shave mainly. He is not longer doing PT.   Lab Results  Component Value Date   CREATININE 2.02 (H) 06/12/2016   BUN 37 (H) 06/12/2016   NA 140 06/12/2016   K 4.7 06/12/2016   CL 106 06/12/2016   CO2 25 06/12/2016    Lab Results  Component Value Date   HGBA1C 8.0 (H) 03/08/2016   Lab Results  Component Value Date   MICROALBUR 43.3 (H) 02/08/2012    Depression and insomnia: Currently on Remeron 15 mg daily. Medication has helped with mood and sleep. He is tolerating medication well. He denies suicidal thoughts.  -Daughter is also reporting bilateral leg pain, mainly at night time, keeps him from sleep.He is on Hydrocodone-Acetaminophen, prescribed after his last hospitalization.He usually takes it at night and helps with pain,he has 3-4 tabs left. He also takes pain med during the day as needed. Mild constipation, denies abdominal pain,nasuea,or vomiting.  Sharp pain,8/10, continues, exacerbated factors not identified.  + Peripheral neuropathy and PAD.  A few weeks ago he was sick with URI,still coughing,no hemoptysis. He has not had fever or chills. No wheezing or dyspnea.   Review of Systems  Constitutional: Positive for  fatigue. Negative for appetite change, chills and fever.  HENT: Positive for postnasal drip. Negative for congestion, nosebleeds, sinus pain, sore throat and trouble swallowing.   Eyes: Negative for redness and visual disturbance.  Respiratory: Positive for cough (recovering from recent URI). Negative for wheezing.   Cardiovascular: Positive for leg swelling. Negative for chest pain and palpitations.  Gastrointestinal: Positive for constipation. Negative for abdominal pain, nausea and vomiting.  Endocrine: Negative for  polydipsia, polyphagia and polyuria.  Genitourinary: Negative for decreased urine volume and hematuria.  Musculoskeletal: Positive for arthralgias, gait problem and myalgias.  Skin: Positive for wound. Negative for rash.  Neurological: Positive for numbness. Negative for syncope, weakness and headaches.  Psychiatric/Behavioral: Positive for sleep disturbance. Negative for confusion and suicidal ideas. The patient is not nervous/anxious.       Current Outpatient Prescriptions on File Prior to Visit  Medication Sig Dispense Refill  . Amino Acids-Protein Hydrolys (FEEDING SUPPLEMENT, PRO-STAT SUGAR FREE 64,) LIQD Take 30 mLs by mouth 2 (two) times daily. 900 mL 0  . amiodarone (PACERONE) 200 MG tablet Take 1 tablet (200 mg total) by mouth daily. 30 tablet 3  . Ascorbic Acid (VITAMIN C PO) Take 1 tablet by mouth 2 (two) times daily.    Marland Kitchen aspirin EC 81 MG tablet Take 81 mg by mouth daily.    . carvedilol (COREG) 3.125 MG tablet Take 1.5625 mg by mouth 2 (two) times daily with a meal.    . dextromethorphan-guaiFENesin (MUCINEX DM) 30-600 MG 12hr tablet Take 1 tablet by mouth daily. 30 tablet 0  . finasteride (PROSCAR) 5 MG tablet Take 5 mg by mouth daily.     . insulin aspart (NOVOLOG FLEXPEN) 100 UNIT/ML FlexPen Inject 5-15 Units into the skin 3 (three) times daily with meals. Per sliding scale    . Insulin Glargine (LANTUS SOLOSTAR) 100 UNIT/ML Solostar Pen Inject 20 Units into the skin at bedtime. 15 mL 1  . losartan (COZAAR) 25 MG tablet Take 0.5 tablets (12.5 mg total) by mouth daily. 15 tablet 2  . MAGNESIUM PO Take 1 tablet by mouth every other day.    . multivitamin-iron-minerals-folic acid (THERAPEUTIC-M) TABS tablet Take 1 tablet by mouth daily.    . Probiotic Product (PROBIOTIC PO) Take 1 capsule by mouth daily.    . rosuvastatin (CRESTOR) 40 MG tablet Take 40 mg by mouth daily.    . tamsulosin (FLOMAX) 0.4 MG CAPS capsule TAKE 1 CAPSULE(0.4 MG) BY MOUTH DAILY 30 capsule 0  .  torsemide (DEMADEX) 20 MG tablet Take 1 tablet (20 mg total) by mouth every other day. Start on Monday 03/12/2016 30 tablet 0  . vitamin A 10000 UNIT capsule Take 10,000 Units by mouth daily.    Marland Kitchen zinc sulfate 220 (50 Zn) MG capsule Take 220 mg by mouth daily.     No current facility-administered medications on file prior to visit.      Past Medical History:  Diagnosis Date  . Automatic implantable cardioverter-defibrillator in situ   . CAD (coronary artery disease)    a. LHC (08/2005):  Ostial LAD 99%, mid LAD 50%, superior D1 80%, inferior D1 75%, mid OM proximal 80%, proximal PDA 25-30%, mid RCA 99%.   MRI with full thickness scar.  Med Rx.  2015 demonstrated similar diffuse three-vessel coronary disease. Perfusion imaging with rest we distribution demonstrated no viability in the area of the LAD and circumflex. Medical management.  . CAD, multiple vessel, RCA 100% mid occl.; LAD 99% stenosisprox.  mid  of 50-60%; LCX OM-1 90%, OM-2 99%,AV groove 80%  12/10/2013  . Chronic combined systolic and diastolic heart failure ([place])   . CKD (chronic kidney disease)   . Gout   . Hx of echocardiogram 2015   Echo (08/2013):  EF 10-15%, diff HK, mod LAE, severe RVE, mod reduced RVSF, mild RAE, PASP 41 mmHg.  Marland Kitchen Hypertension   . Ischemic cardiomyopathy    Echo (8/11):  EF 40-45%;  Echo (5/15):  EF 10-15%  . Myocardial infarction 2007   out of hosp MI - LHC with 3v CAD rx medically  . NSVT (nonsustained ventricular tachycardia) (HCC)    s/p ICD  . Peripheral neuropathy ([place])   . Sleep apnea    "has CPAP; won't use it"  . Stroke [facility]) ~ 2013   "right leg weak since" (12/09/2013)  . Type II diabetes mellitus ([place]) dx'd 2005   Allergies  Allergen Reactions  . Sulfa Antibiotics Rash    Social History   Social History  . Marital status: Married    Spouse name: N/A  . Number of children: N/A  . Years of education: N/A   Occupational History  . retired    Social History Main Topics  .  Smoking status: Never Smoker  . Smokeless tobacco: Never Used  . Alcohol use No  . Drug use: No  . Sexual activity: Not Asked   Other Topics Concern  . 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   He lives with wife.   Highest level of education:  High school       Vitals:   06/26/16 0946  BP: 130/80  Pulse: 88  Resp: 12  O2 sat at RA 96%. There is no height or weight on file to calculate BMI.   Physical Exam  Nursing note and vitals reviewed. Constitutional: He is oriented to person, place, and time. He appears well-developed and well-nourished. No distress.  HENT:  Head: Atraumatic.  Mouth/Throat: Oropharynx is clear and moist and mucous membranes are normal.  Eyes: Conjunctivae and EOM are normal.  Cardiovascular: Normal rate and regular rhythm.   Murmur (I-II/VI SEM base) heard. Respiratory: Effort normal and breath sounds normal. No respiratory distress.  GI: Soft. He exhibits no mass. There is no tenderness.  Musculoskeletal: He exhibits no edema or tenderness.  Lymphadenopathy:    He has no cervical adenopathy.  Neurological: He is alert and oriented to person, place, and time. Coordination normal.  In a wheel chair  Skin: Skin is warm. No rash noted. No erythema.  gauze wrapped around ankles, no oozing appreciated.  Psychiatric: He has a normal mood and affect. Cognition and memory are normal.  Well groomed,good eye contact.      ASSESSMENT AND PLAN:   [name] [name] was seen today for follow-up.  Diagnoses and all orders for this visit:  Type 2 diabetes mellitus with diabetic nephropathy, with long-term current use of insulin ([place])   HgA1C pending,based on glucose report I recommend stopping night sliding scale insulin, will consider stopping Novolog completely. No changes in Lantus for now.  Healthy diet with avoidance of added sugar food  intake is an important part of treatment and recommended.Not able to exercise regularly due to unstable gait. F/U in 3-4 months  -     Hemoglobin A1c -     Basic metabolic panel -  Microalbumin / creatinine urine ratio  Polyneuropathy due to secondary diabetes mellitus ([place])  Continue wound care at home and with wound clinic. Hydrocodone to continue. Some side effects discussed. Miralax as needed for constipation. F/U in 3-4 months.  -     HYDROcodone-acetaminophen (NORCO/VICODIN) 5-325 MG tablet; Take 1 tablet by mouth 2 (two) times daily as needed for moderate pain.  Hypertension with congestive heart failure and renal failure (HCC)  Adequately controlled. No changes in current management. DASH-low salt diet recommended. Eye exam periodically  F/U in 3-4 months, before if needed.  -     Basic metabolic panel  CKD (chronic kidney disease) stage 3, GFR 30-59 ml/min  Cozaar started a few weeks ago. He has had hyperK+ in the past. Currently on diuretic q 2 days. Avoid NSAID's. Low salt diet.  -     VITAMIN D 25 Hydroxy (Vit-D Deficiency, Fractures)  Depression, major, single episode, mild (HCC)  Well controlled No changes in current management. F/U in 6-12 months.    -[name] was advised to return sooner than planned today if new concerns arise.       [name] G. [name], MD  [facility]. [place] office.

## 2016-06-26 ENCOUNTER — Ambulatory Visit (INDEPENDENT_AMBULATORY_CARE_PROVIDER_SITE_OTHER): Payer: Medicare HMO | Admitting: Family Medicine

## 2016-06-26 ENCOUNTER — Encounter (HOSPITAL_BASED_OUTPATIENT_CLINIC_OR_DEPARTMENT_OTHER): Payer: Medicare HMO | Attending: Surgery

## 2016-06-26 ENCOUNTER — Encounter: Payer: Self-pay | Admitting: Family Medicine

## 2016-06-26 VITALS — BP 130/80 | HR 88 | Resp 12 | Ht 74.0 in

## 2016-06-26 DIAGNOSIS — I252 Old myocardial infarction: Secondary | ICD-10-CM | POA: Insufficient documentation

## 2016-06-26 DIAGNOSIS — E1140 Type 2 diabetes mellitus with diabetic neuropathy, unspecified: Secondary | ICD-10-CM | POA: Diagnosis not present

## 2016-06-26 DIAGNOSIS — Z794 Long term (current) use of insulin: Secondary | ICD-10-CM | POA: Diagnosis not present

## 2016-06-26 DIAGNOSIS — D649 Anemia, unspecified: Secondary | ICD-10-CM | POA: Insufficient documentation

## 2016-06-26 DIAGNOSIS — I5031 Acute diastolic (congestive) heart failure: Secondary | ICD-10-CM | POA: Diagnosis not present

## 2016-06-26 DIAGNOSIS — L89610 Pressure ulcer of right heel, unstageable: Secondary | ICD-10-CM | POA: Diagnosis not present

## 2016-06-26 DIAGNOSIS — M109 Gout, unspecified: Secondary | ICD-10-CM | POA: Insufficient documentation

## 2016-06-26 DIAGNOSIS — I130 Hypertensive heart and chronic kidney disease with heart failure and stage 1 through stage 4 chronic kidney disease, or unspecified chronic kidney disease: Secondary | ICD-10-CM | POA: Diagnosis not present

## 2016-06-26 DIAGNOSIS — L89613 Pressure ulcer of right heel, stage 3: Secondary | ICD-10-CM | POA: Diagnosis not present

## 2016-06-26 DIAGNOSIS — I110 Hypertensive heart disease with heart failure: Secondary | ICD-10-CM | POA: Insufficient documentation

## 2016-06-26 DIAGNOSIS — G4730 Sleep apnea, unspecified: Secondary | ICD-10-CM | POA: Diagnosis not present

## 2016-06-26 DIAGNOSIS — E11621 Type 2 diabetes mellitus with foot ulcer: Secondary | ICD-10-CM | POA: Insufficient documentation

## 2016-06-26 DIAGNOSIS — L89522 Pressure ulcer of left ankle, stage 2: Secondary | ICD-10-CM | POA: Diagnosis not present

## 2016-06-26 DIAGNOSIS — E1342 Other specified diabetes mellitus with diabetic polyneuropathy: Secondary | ICD-10-CM

## 2016-06-26 DIAGNOSIS — L89512 Pressure ulcer of right ankle, stage 2: Secondary | ICD-10-CM | POA: Insufficient documentation

## 2016-06-26 DIAGNOSIS — I25119 Atherosclerotic heart disease of native coronary artery with unspecified angina pectoris: Secondary | ICD-10-CM | POA: Diagnosis not present

## 2016-06-26 DIAGNOSIS — L89620 Pressure ulcer of left heel, unstageable: Secondary | ICD-10-CM | POA: Diagnosis not present

## 2016-06-26 DIAGNOSIS — E1121 Type 2 diabetes mellitus with diabetic nephropathy: Secondary | ICD-10-CM

## 2016-06-26 DIAGNOSIS — F320 Major depressive disorder, single episode, mild: Secondary | ICD-10-CM

## 2016-06-26 DIAGNOSIS — N183 Chronic kidney disease, stage 3 unspecified: Secondary | ICD-10-CM

## 2016-06-26 DIAGNOSIS — L89623 Pressure ulcer of left heel, stage 3: Secondary | ICD-10-CM | POA: Insufficient documentation

## 2016-06-26 LAB — BASIC METABOLIC PANEL
BUN: 45 mg/dL — ABNORMAL HIGH (ref 6–23)
CALCIUM: 8.7 mg/dL (ref 8.4–10.5)
CO2: 29 meq/L (ref 19–32)
CREATININE: 2.12 mg/dL — AB (ref 0.40–1.50)
Chloride: 108 mEq/L (ref 96–112)
GFR: 32.99 mL/min — ABNORMAL LOW (ref >60.00)
Glucose, Bld: 240 mg/dL — ABNORMAL HIGH (ref 70–99)
Potassium: 5.2 mEq/L — ABNORMAL HIGH (ref 3.5–5.1)
Sodium: 142 mEq/L (ref 135–145)

## 2016-06-26 LAB — MICROALBUMIN / CREATININE URINE RATIO
Creatinine,U: 81 mg/dL
Microalb Creat Ratio: 27.3 mg/g (ref 0.0–30.0)
Microalb, Ur: 22.1 mg/dL — ABNORMAL HIGH (ref 0.0–1.9)

## 2016-06-26 LAB — VITAMIN D 25 HYDROXY (VIT D DEFICIENCY, FRACTURES): VITD: 31.93 ng/mL (ref 30.00–100.00)

## 2016-06-26 LAB — HEMOGLOBIN A1C: Hgb A1c MFr Bld: 6 % (ref 4.6–6.5)

## 2016-06-26 MED ORDER — HYDROCODONE-ACETAMINOPHEN 5-325 MG PO TABS: 1.0000 | ORAL_TABLET | Freq: Two times a day (BID) | ORAL | 0 refills | Status: DC | PRN

## 2016-06-26 NOTE — Progress Notes (Signed)
Pre visit review using our clinic review tool, if applicable. No additional management support is needed unless otherwise documented below in the visit note. 

## 2016-06-26 NOTE — Patient Instructions (Signed)
A few things to remember from today's visit:   Type 2 diabetes mellitus with diabetic nephropathy, with long-term current use of insulin ([place]) - Plan: Hemoglobin T8S, Basic metabolic panel, Microalbumin / creatinine urine ratio  Hypertension with congestive heart failure and renal failure (HCC) - Plan: Basic metabolic panel  Polyneuropathy due to secondary diabetes mellitus (HCC) - Plan: HYDROcodone-acetaminophen (NORCO/VICODIN) 5-325 MG tablet  Depression, major, single episode, mild (HCC)  CKD (chronic kidney disease) stage 3, GFR 30-59 ml/min - Plan: VITAMIN D 25 Hydroxy (Vit-D Deficiency, Fractures)  Lantus at 5 pm, stop sling scale insulin at night.  Miralax q 2 days if needed.   Please be sure medication list is accurate. If a new problem present, please set up appointment sooner than planned today.

## 2016-06-27 ENCOUNTER — Other Ambulatory Visit (HOSPITAL_COMMUNITY): Payer: Self-pay | Admitting: Internal Medicine

## 2016-06-27 ENCOUNTER — Other Ambulatory Visit: Payer: Self-pay | Admitting: Family Medicine

## 2016-06-27 DIAGNOSIS — L89623 Pressure ulcer of left heel, stage 3: Secondary | ICD-10-CM | POA: Diagnosis not present

## 2016-06-27 DIAGNOSIS — L97521 Non-pressure chronic ulcer of other part of left foot limited to breakdown of skin: Secondary | ICD-10-CM | POA: Diagnosis not present

## 2016-06-27 DIAGNOSIS — L97511 Non-pressure chronic ulcer of other part of right foot limited to breakdown of skin: Secondary | ICD-10-CM | POA: Diagnosis not present

## 2016-06-27 DIAGNOSIS — L89613 Pressure ulcer of right heel, stage 3: Secondary | ICD-10-CM | POA: Diagnosis not present

## 2016-06-27 DIAGNOSIS — I130 Hypertensive heart and chronic kidney disease with heart failure and stage 1 through stage 4 chronic kidney disease, or unspecified chronic kidney disease: Secondary | ICD-10-CM | POA: Diagnosis not present

## 2016-06-27 DIAGNOSIS — E11621 Type 2 diabetes mellitus with foot ulcer: Secondary | ICD-10-CM | POA: Diagnosis not present

## 2016-06-29 DIAGNOSIS — L89613 Pressure ulcer of right heel, stage 3: Secondary | ICD-10-CM | POA: Diagnosis not present

## 2016-06-29 DIAGNOSIS — L97511 Non-pressure chronic ulcer of other part of right foot limited to breakdown of skin: Secondary | ICD-10-CM | POA: Diagnosis not present

## 2016-06-29 DIAGNOSIS — L97521 Non-pressure chronic ulcer of other part of left foot limited to breakdown of skin: Secondary | ICD-10-CM | POA: Diagnosis not present

## 2016-06-29 DIAGNOSIS — L89623 Pressure ulcer of left heel, stage 3: Secondary | ICD-10-CM | POA: Diagnosis not present

## 2016-06-29 DIAGNOSIS — E11621 Type 2 diabetes mellitus with foot ulcer: Secondary | ICD-10-CM | POA: Diagnosis not present

## 2016-06-29 DIAGNOSIS — I130 Hypertensive heart and chronic kidney disease with heart failure and stage 1 through stage 4 chronic kidney disease, or unspecified chronic kidney disease: Secondary | ICD-10-CM | POA: Diagnosis not present

## 2016-07-02 ENCOUNTER — Other Ambulatory Visit: Payer: Self-pay

## 2016-07-02 DIAGNOSIS — E875 Hyperkalemia: Secondary | ICD-10-CM

## 2016-07-03 ENCOUNTER — Other Ambulatory Visit (INDEPENDENT_AMBULATORY_CARE_PROVIDER_SITE_OTHER): Payer: Medicare HMO

## 2016-07-03 DIAGNOSIS — E875 Hyperkalemia: Secondary | ICD-10-CM

## 2016-07-03 DIAGNOSIS — E1122 Type 2 diabetes mellitus with diabetic chronic kidney disease: Secondary | ICD-10-CM | POA: Diagnosis not present

## 2016-07-03 DIAGNOSIS — L89613 Pressure ulcer of right heel, stage 3: Secondary | ICD-10-CM | POA: Diagnosis not present

## 2016-07-03 DIAGNOSIS — I25119 Atherosclerotic heart disease of native coronary artery with unspecified angina pectoris: Secondary | ICD-10-CM | POA: Diagnosis not present

## 2016-07-03 DIAGNOSIS — R278 Other lack of coordination: Secondary | ICD-10-CM | POA: Diagnosis not present

## 2016-07-03 DIAGNOSIS — M6281 Muscle weakness (generalized): Secondary | ICD-10-CM | POA: Diagnosis not present

## 2016-07-03 DIAGNOSIS — D649 Anemia, unspecified: Secondary | ICD-10-CM | POA: Diagnosis not present

## 2016-07-03 DIAGNOSIS — Z4789 Encounter for other orthopedic aftercare: Secondary | ICD-10-CM | POA: Diagnosis not present

## 2016-07-03 DIAGNOSIS — I5022 Chronic systolic (congestive) heart failure: Secondary | ICD-10-CM | POA: Diagnosis not present

## 2016-07-03 DIAGNOSIS — L89522 Pressure ulcer of left ankle, stage 2: Secondary | ICD-10-CM | POA: Diagnosis not present

## 2016-07-03 DIAGNOSIS — L89512 Pressure ulcer of right ankle, stage 2: Secondary | ICD-10-CM | POA: Diagnosis not present

## 2016-07-03 DIAGNOSIS — E11621 Type 2 diabetes mellitus with foot ulcer: Secondary | ICD-10-CM | POA: Diagnosis not present

## 2016-07-03 DIAGNOSIS — I5031 Acute diastolic (congestive) heart failure: Secondary | ICD-10-CM | POA: Diagnosis not present

## 2016-07-03 DIAGNOSIS — I2510 Atherosclerotic heart disease of native coronary artery without angina pectoris: Secondary | ICD-10-CM | POA: Diagnosis not present

## 2016-07-03 DIAGNOSIS — R262 Difficulty in walking, not elsewhere classified: Secondary | ICD-10-CM | POA: Diagnosis not present

## 2016-07-03 DIAGNOSIS — L89610 Pressure ulcer of right heel, unstageable: Secondary | ICD-10-CM | POA: Diagnosis not present

## 2016-07-03 DIAGNOSIS — L89623 Pressure ulcer of left heel, stage 3: Secondary | ICD-10-CM | POA: Diagnosis not present

## 2016-07-03 DIAGNOSIS — I110 Hypertensive heart disease with heart failure: Secondary | ICD-10-CM | POA: Diagnosis not present

## 2016-07-03 LAB — POTASSIUM: Potassium: 4.8 mEq/L (ref 3.5–5.1)

## 2016-07-05 DIAGNOSIS — E11621 Type 2 diabetes mellitus with foot ulcer: Secondary | ICD-10-CM | POA: Diagnosis not present

## 2016-07-05 DIAGNOSIS — L97511 Non-pressure chronic ulcer of other part of right foot limited to breakdown of skin: Secondary | ICD-10-CM | POA: Diagnosis not present

## 2016-07-05 DIAGNOSIS — L89623 Pressure ulcer of left heel, stage 3: Secondary | ICD-10-CM | POA: Diagnosis not present

## 2016-07-05 DIAGNOSIS — I130 Hypertensive heart and chronic kidney disease with heart failure and stage 1 through stage 4 chronic kidney disease, or unspecified chronic kidney disease: Secondary | ICD-10-CM | POA: Diagnosis not present

## 2016-07-05 DIAGNOSIS — L89613 Pressure ulcer of right heel, stage 3: Secondary | ICD-10-CM | POA: Diagnosis not present

## 2016-07-05 DIAGNOSIS — L97521 Non-pressure chronic ulcer of other part of left foot limited to breakdown of skin: Secondary | ICD-10-CM | POA: Diagnosis not present

## 2016-07-06 ENCOUNTER — Other Ambulatory Visit: Payer: Self-pay | Admitting: Family Medicine

## 2016-07-06 DIAGNOSIS — L97511 Non-pressure chronic ulcer of other part of right foot limited to breakdown of skin: Secondary | ICD-10-CM | POA: Diagnosis not present

## 2016-07-06 DIAGNOSIS — L89623 Pressure ulcer of left heel, stage 3: Secondary | ICD-10-CM | POA: Diagnosis not present

## 2016-07-06 DIAGNOSIS — L89613 Pressure ulcer of right heel, stage 3: Secondary | ICD-10-CM | POA: Diagnosis not present

## 2016-07-06 DIAGNOSIS — L97521 Non-pressure chronic ulcer of other part of left foot limited to breakdown of skin: Secondary | ICD-10-CM | POA: Diagnosis not present

## 2016-07-06 DIAGNOSIS — I130 Hypertensive heart and chronic kidney disease with heart failure and stage 1 through stage 4 chronic kidney disease, or unspecified chronic kidney disease: Secondary | ICD-10-CM | POA: Diagnosis not present

## 2016-07-06 DIAGNOSIS — E11621 Type 2 diabetes mellitus with foot ulcer: Secondary | ICD-10-CM | POA: Diagnosis not present

## 2016-07-06 MED ORDER — INSULIN GLARGINE 100 UNIT/ML SOLOSTAR PEN: 10.0000 [IU] | PEN_INJECTOR | Freq: Every day | SUBCUTANEOUS | 1 refills | Status: DC

## 2016-07-10 DIAGNOSIS — D649 Anemia, unspecified: Secondary | ICD-10-CM | POA: Diagnosis not present

## 2016-07-10 DIAGNOSIS — I110 Hypertensive heart disease with heart failure: Secondary | ICD-10-CM | POA: Diagnosis not present

## 2016-07-10 DIAGNOSIS — L89512 Pressure ulcer of right ankle, stage 2: Secondary | ICD-10-CM | POA: Diagnosis not present

## 2016-07-10 DIAGNOSIS — L89522 Pressure ulcer of left ankle, stage 2: Secondary | ICD-10-CM | POA: Diagnosis not present

## 2016-07-10 DIAGNOSIS — E11621 Type 2 diabetes mellitus with foot ulcer: Secondary | ICD-10-CM | POA: Diagnosis not present

## 2016-07-10 DIAGNOSIS — L89610 Pressure ulcer of right heel, unstageable: Secondary | ICD-10-CM | POA: Diagnosis not present

## 2016-07-10 DIAGNOSIS — L89623 Pressure ulcer of left heel, stage 3: Secondary | ICD-10-CM | POA: Diagnosis not present

## 2016-07-10 DIAGNOSIS — L89613 Pressure ulcer of right heel, stage 3: Secondary | ICD-10-CM | POA: Diagnosis not present

## 2016-07-10 DIAGNOSIS — I25119 Atherosclerotic heart disease of native coronary artery with unspecified angina pectoris: Secondary | ICD-10-CM | POA: Diagnosis not present

## 2016-07-10 DIAGNOSIS — I5031 Acute diastolic (congestive) heart failure: Secondary | ICD-10-CM | POA: Diagnosis not present

## 2016-07-11 DIAGNOSIS — L97511 Non-pressure chronic ulcer of other part of right foot limited to breakdown of skin: Secondary | ICD-10-CM | POA: Diagnosis not present

## 2016-07-11 DIAGNOSIS — E11621 Type 2 diabetes mellitus with foot ulcer: Secondary | ICD-10-CM | POA: Diagnosis not present

## 2016-07-11 DIAGNOSIS — L97521 Non-pressure chronic ulcer of other part of left foot limited to breakdown of skin: Secondary | ICD-10-CM | POA: Diagnosis not present

## 2016-07-11 DIAGNOSIS — L89623 Pressure ulcer of left heel, stage 3: Secondary | ICD-10-CM | POA: Diagnosis not present

## 2016-07-11 DIAGNOSIS — I130 Hypertensive heart and chronic kidney disease with heart failure and stage 1 through stage 4 chronic kidney disease, or unspecified chronic kidney disease: Secondary | ICD-10-CM | POA: Diagnosis not present

## 2016-07-11 DIAGNOSIS — L89613 Pressure ulcer of right heel, stage 3: Secondary | ICD-10-CM | POA: Diagnosis not present

## 2016-07-13 DIAGNOSIS — E11621 Type 2 diabetes mellitus with foot ulcer: Secondary | ICD-10-CM | POA: Diagnosis not present

## 2016-07-13 DIAGNOSIS — L97511 Non-pressure chronic ulcer of other part of right foot limited to breakdown of skin: Secondary | ICD-10-CM | POA: Diagnosis not present

## 2016-07-13 DIAGNOSIS — L89623 Pressure ulcer of left heel, stage 3: Secondary | ICD-10-CM | POA: Diagnosis not present

## 2016-07-13 DIAGNOSIS — L89613 Pressure ulcer of right heel, stage 3: Secondary | ICD-10-CM | POA: Diagnosis not present

## 2016-07-13 DIAGNOSIS — L97521 Non-pressure chronic ulcer of other part of left foot limited to breakdown of skin: Secondary | ICD-10-CM | POA: Diagnosis not present

## 2016-07-13 DIAGNOSIS — I130 Hypertensive heart and chronic kidney disease with heart failure and stage 1 through stage 4 chronic kidney disease, or unspecified chronic kidney disease: Secondary | ICD-10-CM | POA: Diagnosis not present

## 2016-07-17 DIAGNOSIS — I5031 Acute diastolic (congestive) heart failure: Secondary | ICD-10-CM | POA: Diagnosis not present

## 2016-07-17 DIAGNOSIS — L89613 Pressure ulcer of right heel, stage 3: Secondary | ICD-10-CM | POA: Diagnosis not present

## 2016-07-17 DIAGNOSIS — L89512 Pressure ulcer of right ankle, stage 2: Secondary | ICD-10-CM | POA: Diagnosis not present

## 2016-07-17 DIAGNOSIS — L89522 Pressure ulcer of left ankle, stage 2: Secondary | ICD-10-CM | POA: Diagnosis not present

## 2016-07-17 DIAGNOSIS — L89623 Pressure ulcer of left heel, stage 3: Secondary | ICD-10-CM | POA: Diagnosis not present

## 2016-07-17 DIAGNOSIS — E11621 Type 2 diabetes mellitus with foot ulcer: Secondary | ICD-10-CM | POA: Diagnosis not present

## 2016-07-17 DIAGNOSIS — L89610 Pressure ulcer of right heel, unstageable: Secondary | ICD-10-CM | POA: Diagnosis not present

## 2016-07-17 DIAGNOSIS — I110 Hypertensive heart disease with heart failure: Secondary | ICD-10-CM | POA: Diagnosis not present

## 2016-07-17 DIAGNOSIS — N312 Flaccid neuropathic bladder, not elsewhere classified: Secondary | ICD-10-CM | POA: Diagnosis not present

## 2016-07-17 DIAGNOSIS — I25119 Atherosclerotic heart disease of native coronary artery with unspecified angina pectoris: Secondary | ICD-10-CM | POA: Diagnosis not present

## 2016-07-17 DIAGNOSIS — D649 Anemia, unspecified: Secondary | ICD-10-CM | POA: Diagnosis not present

## 2016-07-18 DIAGNOSIS — E11621 Type 2 diabetes mellitus with foot ulcer: Secondary | ICD-10-CM | POA: Diagnosis not present

## 2016-07-18 DIAGNOSIS — L89623 Pressure ulcer of left heel, stage 3: Secondary | ICD-10-CM | POA: Diagnosis not present

## 2016-07-18 DIAGNOSIS — L89613 Pressure ulcer of right heel, stage 3: Secondary | ICD-10-CM | POA: Diagnosis not present

## 2016-07-18 DIAGNOSIS — I130 Hypertensive heart and chronic kidney disease with heart failure and stage 1 through stage 4 chronic kidney disease, or unspecified chronic kidney disease: Secondary | ICD-10-CM | POA: Diagnosis not present

## 2016-07-18 DIAGNOSIS — L97521 Non-pressure chronic ulcer of other part of left foot limited to breakdown of skin: Secondary | ICD-10-CM | POA: Diagnosis not present

## 2016-07-18 DIAGNOSIS — L97511 Non-pressure chronic ulcer of other part of right foot limited to breakdown of skin: Secondary | ICD-10-CM | POA: Diagnosis not present

## 2016-07-19 ENCOUNTER — Ambulatory Visit (HOSPITAL_COMMUNITY)
Admission: RE | Admit: 2016-07-19 | Discharge: 2016-07-19 | Disposition: A | Discharge status: Home / Self Care | Payer: Medicare HMO | Source: Ambulatory Visit | Attending: Internal Medicine | Admitting: Internal Medicine

## 2016-07-19 ENCOUNTER — Encounter (HOSPITAL_COMMUNITY): Payer: Self-pay | Admitting: Internal Medicine

## 2016-07-19 VITALS — BP 100/62 | HR 75

## 2016-07-19 DIAGNOSIS — I472 Ventricular tachycardia, unspecified: Secondary | ICD-10-CM

## 2016-07-19 DIAGNOSIS — I5042 Chronic combined systolic (congestive) and diastolic (congestive) heart failure: Secondary | ICD-10-CM | POA: Diagnosis not present

## 2016-07-19 DIAGNOSIS — N320 Bladder-neck obstruction: Secondary | ICD-10-CM

## 2016-07-19 DIAGNOSIS — N183 Chronic kidney disease, stage 3 unspecified: Secondary | ICD-10-CM

## 2016-07-19 DIAGNOSIS — I2510 Atherosclerotic heart disease of native coronary artery without angina pectoris: Secondary | ICD-10-CM

## 2016-07-19 DIAGNOSIS — R319 Hematuria, unspecified: Secondary | ICD-10-CM | POA: Diagnosis not present

## 2016-07-19 DIAGNOSIS — S72001D Fracture of unspecified part of neck of right femur, subsequent encounter for closed fracture with routine healing: Secondary | ICD-10-CM

## 2016-07-19 LAB — COMPREHENSIVE METABOLIC PANEL
ALT: 24 U/L (ref 17–63)
AST: 26 U/L (ref 15–41)
Albumin: 2.7 g/dL — ABNORMAL LOW (ref 3.5–5.0)
Alkaline Phosphatase: 142 U/L — ABNORMAL HIGH (ref 38–126)
Anion gap: 8 (ref 5–15)
BILIRUBIN TOTAL: 0.6 mg/dL (ref 0.3–1.2)
BUN: 35 mg/dL — AB (ref 6–20)
CALCIUM: 8.7 mg/dL — AB (ref 8.9–10.3)
CO2: 26 mmol/L (ref 22–32)
CREATININE: 1.78 mg/dL — AB (ref 0.61–1.24)
Chloride: 104 mmol/L (ref 101–111)
GFR, EST AFRICAN AMERICAN: 43 mL/min — AB (ref >60)
GFR, EST NON AFRICAN AMERICAN: 37 mL/min — AB (ref >60)
Glucose, Bld: 254 mg/dL — ABNORMAL HIGH (ref 65–99)
Potassium: 5.5 mmol/L — ABNORMAL HIGH (ref 3.5–5.1)
Sodium: 138 mmol/L (ref 135–145)
TOTAL PROTEIN: 7.5 g/dL (ref 6.5–8.1)

## 2016-07-19 LAB — TSH: TSH: 2.365 u[IU]/mL (ref 0.350–4.500)

## 2016-07-19 NOTE — Patient Instructions (Signed)
Labs today (will call for abnormal results, otherwise no news is good news)  Follow up in 3 Months with Dr. Haroldine Laws
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
## 2016-07-20 LAB — T3, FREE: T3, Free: 2.4 pg/mL (ref 2.0–4.4)

## 2016-07-24 ENCOUNTER — Encounter (HOSPITAL_BASED_OUTPATIENT_CLINIC_OR_DEPARTMENT_OTHER): Payer: Medicare HMO | Attending: Surgery

## 2016-07-24 DIAGNOSIS — Y849 Medical procedure, unspecified as the cause of abnormal reaction of the patient, or of later complication, without mention of misadventure at the time of the procedure: Secondary | ICD-10-CM | POA: Diagnosis not present

## 2016-07-24 DIAGNOSIS — I2510 Atherosclerotic heart disease of native coronary artery without angina pectoris: Secondary | ICD-10-CM | POA: Diagnosis not present

## 2016-07-24 DIAGNOSIS — I25119 Atherosclerotic heart disease of native coronary artery with unspecified angina pectoris: Secondary | ICD-10-CM | POA: Diagnosis not present

## 2016-07-24 DIAGNOSIS — G4730 Sleep apnea, unspecified: Secondary | ICD-10-CM | POA: Insufficient documentation

## 2016-07-24 DIAGNOSIS — I10 Essential (primary) hypertension: Secondary | ICD-10-CM | POA: Diagnosis not present

## 2016-07-24 DIAGNOSIS — L89510 Pressure ulcer of right ankle, unstageable: Secondary | ICD-10-CM | POA: Diagnosis not present

## 2016-07-24 DIAGNOSIS — L89610 Pressure ulcer of right heel, unstageable: Secondary | ICD-10-CM | POA: Diagnosis not present

## 2016-07-24 DIAGNOSIS — I252 Old myocardial infarction: Secondary | ICD-10-CM | POA: Insufficient documentation

## 2016-07-24 DIAGNOSIS — T8130XA Disruption of wound, unspecified, initial encounter: Secondary | ICD-10-CM | POA: Insufficient documentation

## 2016-07-24 DIAGNOSIS — L97522 Non-pressure chronic ulcer of other part of left foot with fat layer exposed: Secondary | ICD-10-CM | POA: Insufficient documentation

## 2016-07-24 DIAGNOSIS — L89620 Pressure ulcer of left heel, unstageable: Secondary | ICD-10-CM | POA: Insufficient documentation

## 2016-07-24 DIAGNOSIS — E11621 Type 2 diabetes mellitus with foot ulcer: Secondary | ICD-10-CM | POA: Diagnosis not present

## 2016-07-24 DIAGNOSIS — E1140 Type 2 diabetes mellitus with diabetic neuropathy, unspecified: Secondary | ICD-10-CM | POA: Diagnosis not present

## 2016-07-26 ENCOUNTER — Other Ambulatory Visit (HOSPITAL_COMMUNITY): Payer: Self-pay | Admitting: Adult Health

## 2016-07-31 DIAGNOSIS — G4730 Sleep apnea, unspecified: Secondary | ICD-10-CM | POA: Diagnosis not present

## 2016-07-31 DIAGNOSIS — T8130XA Disruption of wound, unspecified, initial encounter: Secondary | ICD-10-CM | POA: Diagnosis not present

## 2016-07-31 DIAGNOSIS — I25119 Atherosclerotic heart disease of native coronary artery with unspecified angina pectoris: Secondary | ICD-10-CM | POA: Diagnosis not present

## 2016-07-31 DIAGNOSIS — L89620 Pressure ulcer of left heel, unstageable: Secondary | ICD-10-CM | POA: Diagnosis not present

## 2016-07-31 DIAGNOSIS — I2510 Atherosclerotic heart disease of native coronary artery without angina pectoris: Secondary | ICD-10-CM | POA: Diagnosis not present

## 2016-07-31 DIAGNOSIS — L97522 Non-pressure chronic ulcer of other part of left foot with fat layer exposed: Secondary | ICD-10-CM | POA: Diagnosis not present

## 2016-07-31 DIAGNOSIS — I10 Essential (primary) hypertension: Secondary | ICD-10-CM | POA: Diagnosis not present

## 2016-07-31 DIAGNOSIS — L89510 Pressure ulcer of right ankle, unstageable: Secondary | ICD-10-CM | POA: Diagnosis not present

## 2016-07-31 DIAGNOSIS — L89610 Pressure ulcer of right heel, unstageable: Secondary | ICD-10-CM | POA: Diagnosis not present

## 2016-07-31 DIAGNOSIS — E11621 Type 2 diabetes mellitus with foot ulcer: Secondary | ICD-10-CM | POA: Diagnosis not present

## 2016-08-01 DIAGNOSIS — L89613 Pressure ulcer of right heel, stage 3: Secondary | ICD-10-CM | POA: Diagnosis not present

## 2016-08-01 DIAGNOSIS — L89623 Pressure ulcer of left heel, stage 3: Secondary | ICD-10-CM | POA: Diagnosis not present

## 2016-08-01 DIAGNOSIS — L89512 Pressure ulcer of right ankle, stage 2: Secondary | ICD-10-CM | POA: Diagnosis not present

## 2016-08-01 DIAGNOSIS — L89522 Pressure ulcer of left ankle, stage 2: Secondary | ICD-10-CM | POA: Diagnosis not present

## 2016-08-03 DIAGNOSIS — E1122 Type 2 diabetes mellitus with diabetic chronic kidney disease: Secondary | ICD-10-CM | POA: Diagnosis not present

## 2016-08-03 DIAGNOSIS — I2510 Atherosclerotic heart disease of native coronary artery without angina pectoris: Secondary | ICD-10-CM | POA: Diagnosis not present

## 2016-08-03 DIAGNOSIS — M6281 Muscle weakness (generalized): Secondary | ICD-10-CM | POA: Diagnosis not present

## 2016-08-03 DIAGNOSIS — Z4789 Encounter for other orthopedic aftercare: Secondary | ICD-10-CM | POA: Diagnosis not present

## 2016-08-03 DIAGNOSIS — I5022 Chronic systolic (congestive) heart failure: Secondary | ICD-10-CM | POA: Diagnosis not present

## 2016-08-03 DIAGNOSIS — R278 Other lack of coordination: Secondary | ICD-10-CM | POA: Diagnosis not present

## 2016-08-03 DIAGNOSIS — R262 Difficulty in walking, not elsewhere classified: Secondary | ICD-10-CM | POA: Diagnosis not present

## 2016-08-07 DIAGNOSIS — E11621 Type 2 diabetes mellitus with foot ulcer: Secondary | ICD-10-CM | POA: Diagnosis not present

## 2016-08-07 DIAGNOSIS — L97522 Non-pressure chronic ulcer of other part of left foot with fat layer exposed: Secondary | ICD-10-CM | POA: Diagnosis not present

## 2016-08-07 DIAGNOSIS — G4730 Sleep apnea, unspecified: Secondary | ICD-10-CM | POA: Diagnosis not present

## 2016-08-07 DIAGNOSIS — L89510 Pressure ulcer of right ankle, unstageable: Secondary | ICD-10-CM | POA: Diagnosis not present

## 2016-08-07 DIAGNOSIS — I10 Essential (primary) hypertension: Secondary | ICD-10-CM | POA: Diagnosis not present

## 2016-08-07 DIAGNOSIS — L89620 Pressure ulcer of left heel, unstageable: Secondary | ICD-10-CM | POA: Diagnosis not present

## 2016-08-07 DIAGNOSIS — L89610 Pressure ulcer of right heel, unstageable: Secondary | ICD-10-CM | POA: Diagnosis not present

## 2016-08-07 DIAGNOSIS — I2510 Atherosclerotic heart disease of native coronary artery without angina pectoris: Secondary | ICD-10-CM | POA: Diagnosis not present

## 2016-08-07 DIAGNOSIS — I25119 Atherosclerotic heart disease of native coronary artery with unspecified angina pectoris: Secondary | ICD-10-CM | POA: Diagnosis not present

## 2016-08-07 DIAGNOSIS — T8130XA Disruption of wound, unspecified, initial encounter: Secondary | ICD-10-CM | POA: Diagnosis not present

## 2016-08-11 NOTE — Addendum Note (Signed)
Encounter addended by: Jolaine Artist, MD on: 08/11/2016 10:10 PM<BR>    Actions taken: Sign clinical note, LOS modified

## 2016-08-14 DIAGNOSIS — G4730 Sleep apnea, unspecified: Secondary | ICD-10-CM | POA: Diagnosis not present

## 2016-08-14 DIAGNOSIS — T8130XA Disruption of wound, unspecified, initial encounter: Secondary | ICD-10-CM | POA: Diagnosis not present

## 2016-08-14 DIAGNOSIS — L89510 Pressure ulcer of right ankle, unstageable: Secondary | ICD-10-CM | POA: Diagnosis not present

## 2016-08-14 DIAGNOSIS — L97422 Non-pressure chronic ulcer of left heel and midfoot with fat layer exposed: Secondary | ICD-10-CM | POA: Diagnosis not present

## 2016-08-14 DIAGNOSIS — N312 Flaccid neuropathic bladder, not elsewhere classified: Secondary | ICD-10-CM | POA: Diagnosis not present

## 2016-08-14 DIAGNOSIS — I25119 Atherosclerotic heart disease of native coronary artery with unspecified angina pectoris: Secondary | ICD-10-CM | POA: Diagnosis not present

## 2016-08-14 DIAGNOSIS — I2510 Atherosclerotic heart disease of native coronary artery without angina pectoris: Secondary | ICD-10-CM | POA: Diagnosis not present

## 2016-08-14 DIAGNOSIS — L97412 Non-pressure chronic ulcer of right heel and midfoot with fat layer exposed: Secondary | ICD-10-CM | POA: Diagnosis not present

## 2016-08-14 DIAGNOSIS — E11621 Type 2 diabetes mellitus with foot ulcer: Secondary | ICD-10-CM | POA: Diagnosis not present

## 2016-08-14 DIAGNOSIS — L97522 Non-pressure chronic ulcer of other part of left foot with fat layer exposed: Secondary | ICD-10-CM | POA: Diagnosis not present

## 2016-08-14 DIAGNOSIS — L89620 Pressure ulcer of left heel, unstageable: Secondary | ICD-10-CM | POA: Diagnosis not present

## 2016-08-14 DIAGNOSIS — I10 Essential (primary) hypertension: Secondary | ICD-10-CM | POA: Diagnosis not present

## 2016-08-21 ENCOUNTER — Other Ambulatory Visit (HOSPITAL_COMMUNITY): Payer: Self-pay | Admitting: Internal Medicine

## 2016-08-21 ENCOUNTER — Encounter (HOSPITAL_BASED_OUTPATIENT_CLINIC_OR_DEPARTMENT_OTHER): Payer: Medicare HMO | Attending: Surgery

## 2016-08-21 DIAGNOSIS — L89610 Pressure ulcer of right heel, unstageable: Secondary | ICD-10-CM | POA: Insufficient documentation

## 2016-08-21 DIAGNOSIS — I252 Old myocardial infarction: Secondary | ICD-10-CM | POA: Insufficient documentation

## 2016-08-21 DIAGNOSIS — E1140 Type 2 diabetes mellitus with diabetic neuropathy, unspecified: Secondary | ICD-10-CM | POA: Insufficient documentation

## 2016-08-21 DIAGNOSIS — G4730 Sleep apnea, unspecified: Secondary | ICD-10-CM | POA: Insufficient documentation

## 2016-08-21 DIAGNOSIS — I110 Hypertensive heart disease with heart failure: Secondary | ICD-10-CM | POA: Diagnosis not present

## 2016-08-21 DIAGNOSIS — I2510 Atherosclerotic heart disease of native coronary artery without angina pectoris: Secondary | ICD-10-CM | POA: Diagnosis not present

## 2016-08-21 DIAGNOSIS — I509 Heart failure, unspecified: Secondary | ICD-10-CM | POA: Diagnosis not present

## 2016-08-24 ENCOUNTER — Other Ambulatory Visit (HOSPITAL_COMMUNITY): Payer: Self-pay | Admitting: Family Medicine

## 2016-09-02 ENCOUNTER — Other Ambulatory Visit: Payer: Self-pay | Admitting: Family Medicine

## 2016-09-02 DIAGNOSIS — I2510 Atherosclerotic heart disease of native coronary artery without angina pectoris: Secondary | ICD-10-CM | POA: Diagnosis not present

## 2016-09-02 DIAGNOSIS — I5022 Chronic systolic (congestive) heart failure: Secondary | ICD-10-CM | POA: Diagnosis not present

## 2016-09-02 DIAGNOSIS — M6281 Muscle weakness (generalized): Secondary | ICD-10-CM | POA: Diagnosis not present

## 2016-09-02 DIAGNOSIS — R278 Other lack of coordination: Secondary | ICD-10-CM | POA: Diagnosis not present

## 2016-09-02 DIAGNOSIS — R262 Difficulty in walking, not elsewhere classified: Secondary | ICD-10-CM | POA: Diagnosis not present

## 2016-09-02 DIAGNOSIS — Z4789 Encounter for other orthopedic aftercare: Secondary | ICD-10-CM | POA: Diagnosis not present

## 2016-09-02 DIAGNOSIS — E1122 Type 2 diabetes mellitus with diabetic chronic kidney disease: Secondary | ICD-10-CM | POA: Diagnosis not present

## 2016-09-04 DIAGNOSIS — L97412 Non-pressure chronic ulcer of right heel and midfoot with fat layer exposed: Secondary | ICD-10-CM | POA: Diagnosis not present

## 2016-09-04 DIAGNOSIS — I509 Heart failure, unspecified: Secondary | ICD-10-CM | POA: Diagnosis not present

## 2016-09-04 DIAGNOSIS — L89610 Pressure ulcer of right heel, unstageable: Secondary | ICD-10-CM | POA: Diagnosis not present

## 2016-09-04 DIAGNOSIS — I252 Old myocardial infarction: Secondary | ICD-10-CM | POA: Diagnosis not present

## 2016-09-04 DIAGNOSIS — E11621 Type 2 diabetes mellitus with foot ulcer: Secondary | ICD-10-CM | POA: Diagnosis not present

## 2016-09-04 DIAGNOSIS — E1140 Type 2 diabetes mellitus with diabetic neuropathy, unspecified: Secondary | ICD-10-CM | POA: Diagnosis not present

## 2016-09-04 DIAGNOSIS — G4730 Sleep apnea, unspecified: Secondary | ICD-10-CM | POA: Diagnosis not present

## 2016-09-04 DIAGNOSIS — I110 Hypertensive heart disease with heart failure: Secondary | ICD-10-CM | POA: Diagnosis not present

## 2016-09-04 DIAGNOSIS — I2510 Atherosclerotic heart disease of native coronary artery without angina pectoris: Secondary | ICD-10-CM | POA: Diagnosis not present

## 2016-09-11 DIAGNOSIS — L89623 Pressure ulcer of left heel, stage 3: Secondary | ICD-10-CM | POA: Diagnosis not present

## 2016-09-11 DIAGNOSIS — L89613 Pressure ulcer of right heel, stage 3: Secondary | ICD-10-CM | POA: Diagnosis not present

## 2016-09-11 DIAGNOSIS — G4730 Sleep apnea, unspecified: Secondary | ICD-10-CM | POA: Diagnosis not present

## 2016-09-11 DIAGNOSIS — L89512 Pressure ulcer of right ankle, stage 2: Secondary | ICD-10-CM | POA: Diagnosis not present

## 2016-09-11 DIAGNOSIS — I2510 Atherosclerotic heart disease of native coronary artery without angina pectoris: Secondary | ICD-10-CM | POA: Diagnosis not present

## 2016-09-11 DIAGNOSIS — I252 Old myocardial infarction: Secondary | ICD-10-CM | POA: Diagnosis not present

## 2016-09-11 DIAGNOSIS — L89522 Pressure ulcer of left ankle, stage 2: Secondary | ICD-10-CM | POA: Diagnosis not present

## 2016-09-11 DIAGNOSIS — N312 Flaccid neuropathic bladder, not elsewhere classified: Secondary | ICD-10-CM | POA: Diagnosis not present

## 2016-09-11 DIAGNOSIS — L89610 Pressure ulcer of right heel, unstageable: Secondary | ICD-10-CM | POA: Diagnosis not present

## 2016-09-11 DIAGNOSIS — I509 Heart failure, unspecified: Secondary | ICD-10-CM | POA: Diagnosis not present

## 2016-09-11 DIAGNOSIS — I110 Hypertensive heart disease with heart failure: Secondary | ICD-10-CM | POA: Diagnosis not present

## 2016-09-11 DIAGNOSIS — E1140 Type 2 diabetes mellitus with diabetic neuropathy, unspecified: Secondary | ICD-10-CM | POA: Diagnosis not present

## 2016-09-18 ENCOUNTER — Other Ambulatory Visit: Payer: Self-pay | Admitting: Family Medicine

## 2016-09-18 DIAGNOSIS — I509 Heart failure, unspecified: Secondary | ICD-10-CM | POA: Diagnosis not present

## 2016-09-18 DIAGNOSIS — I110 Hypertensive heart disease with heart failure: Secondary | ICD-10-CM | POA: Diagnosis not present

## 2016-09-18 DIAGNOSIS — G4730 Sleep apnea, unspecified: Secondary | ICD-10-CM | POA: Diagnosis not present

## 2016-09-18 DIAGNOSIS — I252 Old myocardial infarction: Secondary | ICD-10-CM | POA: Diagnosis not present

## 2016-09-18 DIAGNOSIS — I2510 Atherosclerotic heart disease of native coronary artery without angina pectoris: Secondary | ICD-10-CM | POA: Diagnosis not present

## 2016-09-18 DIAGNOSIS — E1140 Type 2 diabetes mellitus with diabetic neuropathy, unspecified: Secondary | ICD-10-CM | POA: Diagnosis not present

## 2016-09-18 DIAGNOSIS — L89610 Pressure ulcer of right heel, unstageable: Secondary | ICD-10-CM | POA: Diagnosis not present

## 2016-09-18 DIAGNOSIS — E11621 Type 2 diabetes mellitus with foot ulcer: Secondary | ICD-10-CM | POA: Diagnosis not present

## 2016-09-18 DIAGNOSIS — L97412 Non-pressure chronic ulcer of right heel and midfoot with fat layer exposed: Secondary | ICD-10-CM | POA: Diagnosis not present

## 2016-09-19 DIAGNOSIS — L89610 Pressure ulcer of right heel, unstageable: Secondary | ICD-10-CM | POA: Diagnosis not present

## 2016-09-25 ENCOUNTER — Ambulatory Visit (HOSPITAL_BASED_OUTPATIENT_CLINIC_OR_DEPARTMENT_OTHER): Payer: Medicare HMO

## 2016-10-02 ENCOUNTER — Encounter (HOSPITAL_BASED_OUTPATIENT_CLINIC_OR_DEPARTMENT_OTHER): Payer: Medicare HMO | Attending: Surgery

## 2016-10-02 DIAGNOSIS — L89610 Pressure ulcer of right heel, unstageable: Secondary | ICD-10-CM | POA: Insufficient documentation

## 2016-10-02 DIAGNOSIS — E1140 Type 2 diabetes mellitus with diabetic neuropathy, unspecified: Secondary | ICD-10-CM | POA: Diagnosis not present

## 2016-10-02 DIAGNOSIS — G4730 Sleep apnea, unspecified: Secondary | ICD-10-CM | POA: Diagnosis not present

## 2016-10-02 DIAGNOSIS — I252 Old myocardial infarction: Secondary | ICD-10-CM | POA: Insufficient documentation

## 2016-10-02 DIAGNOSIS — I10 Essential (primary) hypertension: Secondary | ICD-10-CM | POA: Diagnosis not present

## 2016-10-02 DIAGNOSIS — E11621 Type 2 diabetes mellitus with foot ulcer: Secondary | ICD-10-CM | POA: Diagnosis not present

## 2016-10-02 DIAGNOSIS — I2510 Atherosclerotic heart disease of native coronary artery without angina pectoris: Secondary | ICD-10-CM | POA: Diagnosis not present

## 2016-10-02 DIAGNOSIS — L97412 Non-pressure chronic ulcer of right heel and midfoot with fat layer exposed: Secondary | ICD-10-CM | POA: Diagnosis not present

## 2016-10-03 DIAGNOSIS — I2510 Atherosclerotic heart disease of native coronary artery without angina pectoris: Secondary | ICD-10-CM | POA: Diagnosis not present

## 2016-10-03 DIAGNOSIS — R278 Other lack of coordination: Secondary | ICD-10-CM | POA: Diagnosis not present

## 2016-10-03 DIAGNOSIS — R262 Difficulty in walking, not elsewhere classified: Secondary | ICD-10-CM | POA: Diagnosis not present

## 2016-10-03 DIAGNOSIS — I5022 Chronic systolic (congestive) heart failure: Secondary | ICD-10-CM | POA: Diagnosis not present

## 2016-10-03 DIAGNOSIS — M6281 Muscle weakness (generalized): Secondary | ICD-10-CM | POA: Diagnosis not present

## 2016-10-03 DIAGNOSIS — Z4789 Encounter for other orthopedic aftercare: Secondary | ICD-10-CM | POA: Diagnosis not present

## 2016-10-03 DIAGNOSIS — E1122 Type 2 diabetes mellitus with diabetic chronic kidney disease: Secondary | ICD-10-CM | POA: Diagnosis not present

## 2016-10-09 DIAGNOSIS — I10 Essential (primary) hypertension: Secondary | ICD-10-CM | POA: Diagnosis not present

## 2016-10-09 DIAGNOSIS — G4730 Sleep apnea, unspecified: Secondary | ICD-10-CM | POA: Diagnosis not present

## 2016-10-09 DIAGNOSIS — E1140 Type 2 diabetes mellitus with diabetic neuropathy, unspecified: Secondary | ICD-10-CM | POA: Diagnosis not present

## 2016-10-09 DIAGNOSIS — I2510 Atherosclerotic heart disease of native coronary artery without angina pectoris: Secondary | ICD-10-CM | POA: Diagnosis not present

## 2016-10-09 DIAGNOSIS — I252 Old myocardial infarction: Secondary | ICD-10-CM | POA: Diagnosis not present

## 2016-10-09 DIAGNOSIS — L89610 Pressure ulcer of right heel, unstageable: Secondary | ICD-10-CM | POA: Diagnosis not present

## 2016-10-09 DIAGNOSIS — R338 Other retention of urine: Secondary | ICD-10-CM | POA: Diagnosis not present

## 2016-10-16 ENCOUNTER — Encounter: Payer: Self-pay | Admitting: Internal Medicine

## 2016-10-16 ENCOUNTER — Ambulatory Visit (HOSPITAL_COMMUNITY)
Admission: RE | Admit: 2016-10-16 | Discharge: 2016-10-16 | Disposition: A | Discharge status: Home / Self Care | Payer: Medicare HMO | Source: Ambulatory Visit | Attending: Internal Medicine | Admitting: Internal Medicine

## 2016-10-16 VITALS — BP 90/54 | HR 91

## 2016-10-16 DIAGNOSIS — Z7902 Long term (current) use of antithrombotics/antiplatelets: Secondary | ICD-10-CM | POA: Diagnosis not present

## 2016-10-16 DIAGNOSIS — E1122 Type 2 diabetes mellitus with diabetic chronic kidney disease: Secondary | ICD-10-CM | POA: Diagnosis not present

## 2016-10-16 DIAGNOSIS — I130 Hypertensive heart and chronic kidney disease with heart failure and stage 1 through stage 4 chronic kidney disease, or unspecified chronic kidney disease: Secondary | ICD-10-CM | POA: Diagnosis not present

## 2016-10-16 DIAGNOSIS — N183 Chronic kidney disease, stage 3 unspecified: Secondary | ICD-10-CM

## 2016-10-16 DIAGNOSIS — L89610 Pressure ulcer of right heel, unstageable: Secondary | ICD-10-CM | POA: Diagnosis not present

## 2016-10-16 DIAGNOSIS — I2510 Atherosclerotic heart disease of native coronary artery without angina pectoris: Secondary | ICD-10-CM | POA: Diagnosis not present

## 2016-10-16 DIAGNOSIS — I255 Ischemic cardiomyopathy: Secondary | ICD-10-CM | POA: Diagnosis not present

## 2016-10-16 DIAGNOSIS — I472 Ventricular tachycardia: Secondary | ICD-10-CM | POA: Insufficient documentation

## 2016-10-16 DIAGNOSIS — I252 Old myocardial infarction: Secondary | ICD-10-CM | POA: Diagnosis not present

## 2016-10-16 DIAGNOSIS — Z993 Dependence on wheelchair: Secondary | ICD-10-CM | POA: Diagnosis not present

## 2016-10-16 DIAGNOSIS — G4730 Sleep apnea, unspecified: Secondary | ICD-10-CM | POA: Diagnosis not present

## 2016-10-16 DIAGNOSIS — Z7982 Long term (current) use of aspirin: Secondary | ICD-10-CM | POA: Diagnosis not present

## 2016-10-16 DIAGNOSIS — I5042 Chronic combined systolic (congestive) and diastolic (congestive) heart failure: Secondary | ICD-10-CM

## 2016-10-16 DIAGNOSIS — I10 Essential (primary) hypertension: Secondary | ICD-10-CM | POA: Diagnosis not present

## 2016-10-16 DIAGNOSIS — E1140 Type 2 diabetes mellitus with diabetic neuropathy, unspecified: Secondary | ICD-10-CM | POA: Diagnosis not present

## 2016-10-16 LAB — BASIC METABOLIC PANEL
Anion gap: 7 (ref 5–15)
BUN: 25 mg/dL — AB (ref 6–20)
CALCIUM: 8.9 mg/dL (ref 8.9–10.3)
CHLORIDE: 108 mmol/L (ref 101–111)
CO2: 25 mmol/L (ref 22–32)
CREATININE: 1.76 mg/dL — AB (ref 0.61–1.24)
GFR, EST AFRICAN AMERICAN: 43 mL/min — AB (ref >60)
GFR, EST NON AFRICAN AMERICAN: 37 mL/min — AB (ref >60)
Glucose, Bld: 317 mg/dL — ABNORMAL HIGH (ref 65–99)
Potassium: 4.6 mmol/L (ref 3.5–5.1)
SODIUM: 140 mmol/L (ref 135–145)
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
## 2016-10-16 NOTE — Progress Notes (Signed)
Advanced Heart Failure Medication Review by a Pharmacist  Does the patient  feel that his/her medications are working for him/her?  yes  Has the patient been experiencing any side effects to the medications prescribed?  no  Does the patient measure his/her own blood pressure or blood glucose at home?  yes   Does the patient have any problems obtaining medications due to transportation or finances?   no  Understanding of regimen: good Understanding of indications: good Potential of compliance: good Patient understands to avoid NSAIDs. Patient understands to avoid decongestants.  Issues to address at subsequent visits: none   Pharmacist comments: [name] [name] is a pleasant 71 yo male presenting with his daughter who is in charge of medications and refills. They do not bring medications or a list but daughter has a good understanding of medication regimen. All medications are free at this time. Patient still has [employer]. Patient has been eating well and is no longer taking the feeding supplement. No other issues with meds at this time.  [name], Pharm.D. PGY1 Pharmacy Resident 6/26/201810:42 AM Pager [phone]   Time with patient: 7 mins Preparation and documentation time: 3 mins Total time: 10 mins

## 2016-10-16 NOTE — Patient Instructions (Signed)
Lab today  Your physician has requested that you have an echocardiogram. Echocardiography is a painless test that uses sound waves to create images of your heart. It provides your doctor with information about the size and shape of your heart and how well your heart's chambers and valves are working. This procedure takes approximately one hour. There are no restrictions for this procedure.  In 3 months  Your physician recommends that you schedule a follow-up appointment in: 3 months

## 2016-10-23 ENCOUNTER — Encounter (HOSPITAL_BASED_OUTPATIENT_CLINIC_OR_DEPARTMENT_OTHER): Payer: Medicare HMO | Attending: Surgery

## 2016-10-23 DIAGNOSIS — G4730 Sleep apnea, unspecified: Secondary | ICD-10-CM | POA: Diagnosis not present

## 2016-10-23 DIAGNOSIS — I5031 Acute diastolic (congestive) heart failure: Secondary | ICD-10-CM | POA: Diagnosis not present

## 2016-10-23 DIAGNOSIS — I252 Old myocardial infarction: Secondary | ICD-10-CM | POA: Insufficient documentation

## 2016-10-23 DIAGNOSIS — L89610 Pressure ulcer of right heel, unstageable: Secondary | ICD-10-CM | POA: Diagnosis not present

## 2016-10-23 DIAGNOSIS — E1140 Type 2 diabetes mellitus with diabetic neuropathy, unspecified: Secondary | ICD-10-CM | POA: Insufficient documentation

## 2016-10-23 DIAGNOSIS — L97412 Non-pressure chronic ulcer of right heel and midfoot with fat layer exposed: Secondary | ICD-10-CM | POA: Insufficient documentation

## 2016-10-23 DIAGNOSIS — E11621 Type 2 diabetes mellitus with foot ulcer: Secondary | ICD-10-CM | POA: Insufficient documentation

## 2016-10-23 DIAGNOSIS — I25119 Atherosclerotic heart disease of native coronary artery with unspecified angina pectoris: Secondary | ICD-10-CM | POA: Diagnosis not present

## 2016-10-29 NOTE — Progress Notes (Signed)
HPI:   [name] [name] is a 71 y.o. male, who is here today with his daughter to follow on some chronic medical problems.  I last saw him on 06/26/16. Hx of CAD, atrial fib, CHF with EF 15% s/p ICD placement. Since his last OV he has followed with Dr Haroldine Laws, cardiologists.   HTN:  He is currently on Cozaar 25 mg 1/2 tab daily and Coreg 3.125 mg 1/2 tab bid.  + CKD III. He is also following with urologists every 6 months, urine cath due to neurogenic bladder. He has to go to urologists' office to have urine cath changed.  Lab Results  Component Value Date   CREATININE 1.76 (H) 10/16/2016   BUN 25 (H) 10/16/2016   NA 140 10/16/2016   K 4.6 10/16/2016   CL 108 10/16/2016   CO2 25 10/16/2016   Denies headache, visual changes, chest pain, dyspnea, palpitation, claudication, focal weakness, or worsening edema.  DM II:  He is on Lantus 10 U daily. Sliding scale Novolog was discontinued last OV because episodes of hypoglycemia. Hypoglycemic events: None FG in the 110's Post prandial glucose: Not checking.  Lab Results  Component Value Date   HGBA1C 6.0 06/26/2016   Last eye exam: over a year ago.  Chronic pain: LE pain due to peripheral neuropathy, PAD, as well as decubitus ulcers. Pain is worse at night and keeps him from sleep. Pain is worse when he is more active during the day, most of the time he is not moving much, stays in his wheel chair all day. He has a walker, has not used it sine HH PT was stopped.  Rarely pain is 8/10.  He is still taking on Hydrocodone 5-325 mg bid as needed.   Hx of anemia. He has not noted gross hematuria or blood in stool.  Lab Results  Component Value Date   WBC 11.7 (H) 03/09/2016   HGB 9.5 (L) 03/09/2016   HCT 31.8 (L) 03/09/2016   MCV 85.3 03/09/2016   PLT 255 03/09/2016   He is still going to wound care, today he had appt. Left ankle wound has healed. Right foot wound is healing. He has developed sacral  decubitus wound since his last OV.  Daughter states that it is better now, he irritates area when he rubs area against chair. It is about 3 cm. He has not informed wound clinic about this one. Daughter is applying corn starch, bandage he has left from [facility] wound care, and Desitin. He usually has no pain on area. No fall since his last OV.  + Chronic constipation, bowel movement every 7 days. He is not taking OTC treatment consistently. Denies abdominal pain,  Nausea, or vomiting.   Review of Systems  Constitutional: Negative for activity change, chills and fever.  HENT: Negative for mouth sores, nosebleeds and trouble swallowing.   Eyes: Negative for redness and visual disturbance.  Respiratory: Negative for cough, shortness of breath and wheezing.   Cardiovascular: Negative for chest pain, palpitations and leg swelling.  Gastrointestinal: Positive for constipation. Negative for abdominal pain, nausea and vomiting.  Endocrine: Negative for polydipsia, polyphagia and polyuria.  Genitourinary: Negative for decreased urine volume, dysuria and hematuria.  Musculoskeletal: Positive for arthralgias, gait problem and myalgias.  Skin: Positive for wound.  Neurological: Positive for numbness. Negative for syncope, weakness and headaches.  Psychiatric/Behavioral: Negative for confusion and sleep disturbance. The patient is not nervous/anxious.       Current Outpatient Prescriptions  on File Prior to Visit  Medication Sig Dispense Refill  . amiodarone (PACERONE) 200 MG tablet Take 1 tablet (200 mg total) by mouth daily. 30 tablet 3  . Ascorbic Acid (VITAMIN C PO) Take 1 tablet by mouth 2 (two) times daily.    Marland Kitchen aspirin EC 81 MG tablet Take 81 mg by mouth daily.    . clopidogrel (PLAVIX) 75 MG tablet TAKE 1 TABLET(75 MG) BY MOUTH DAILY 30 tablet 11  . finasteride (PROSCAR) 5 MG tablet Take 5 mg by mouth daily.     Marland Kitchen HYDROcodone-acetaminophen (NORCO/VICODIN) 5-325 MG tablet Take 1 tablet by mouth 2  (two) times daily as needed for moderate pain. 50 tablet 0  . MAGNESIUM PO Take 1 tablet by mouth every other day.    . multivitamin-iron-minerals-folic acid (THERAPEUTIC-M) TABS tablet Take 1 tablet by mouth daily.    . Probiotic Product (PROBIOTIC PO) Take 1 capsule by mouth daily.    . rosuvastatin (CRESTOR) 40 MG tablet Take 40 mg by mouth daily.    . tamsulosin (FLOMAX) 0.4 MG CAPS capsule TAKE ONE CAPSULE BY MOUTH DAILY 90 capsule 2  . torsemide (DEMADEX) 20 MG tablet Take 1 tablet (20 mg total) by mouth every other day. Start on Monday 03/12/2016 30 tablet 0  . vitamin A 10000 UNIT capsule Take 10,000 Units by mouth daily.    Marland Kitchen zinc sulfate 220 (50 Zn) MG capsule Take 220 mg by mouth daily.    . Amino Acids-Protein Hydrolys (FEEDING SUPPLEMENT, PRO-STAT SUGAR FREE 64,) LIQD Take 30 mLs by mouth 2 (two) times daily. (Patient not taking: Reported on 10/16/2016) 900 mL 0   No current facility-administered medications on file prior to visit.      Past Medical History:  Diagnosis Date  . Automatic implantable cardioverter-defibrillator in situ   . CAD (coronary artery disease)    a. LHC (08/2005):  Ostial LAD 99%, mid LAD 50%, superior D1 80%, inferior D1 75%, mid OM proximal 80%, proximal PDA 25-30%, mid RCA 99%.   MRI with full thickness scar.  Med Rx.  2015 demonstrated similar diffuse three-vessel coronary disease. Perfusion imaging with rest we distribution demonstrated no viability in the area of the LAD and circumflex. Medical management.  . CAD, multiple vessel, RCA 100% mid occl.; LAD 99% stenosisprox.  mid of 50-60%; LCX OM-1 90%, OM-2 99%,AV groove 80%  12/10/2013  . Chronic combined systolic and diastolic heart failure ([place])   . CKD (chronic kidney disease)   . Gout   . Hx of echocardiogram 2015   Echo (08/2013):  EF 10-15%, diff HK, mod LAE, severe RVE, mod reduced RVSF, mild RAE, PASP 41 mmHg.  Marland Kitchen Hypertension   . Ischemic cardiomyopathy    Echo (8/11):  EF 40-45%;  Echo  (5/15):  EF 10-15%  . Myocardial infarction [facility]) 2007   out of hosp MI - LHC with 3v CAD rx medically  . NSVT (nonsustained ventricular tachycardia) ([facility])    s/p ICD  . Peripheral neuropathy   . Sleep apnea    "has CPAP; won't use it"  . Stroke [facility]) ~ 2013   "right leg weak since" (12/09/2013)  . Type II diabetes mellitus ([place]) dx'd 2005   Allergies  Allergen Reactions  . Sulfa Antibiotics Rash    Social History   Social History  . Marital status: Married    Spouse name: N/A  . Number of children: N/A  . Years of education: N/A   Occupational History  .  retired    Social History Main Topics  . Smoking status: Never Smoker  . Smokeless tobacco: Never Used  . Alcohol use No  . Drug use: No  . Sexual activity: Not Asked   Other Topics Concern  . 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   He lives with wife.   Highest level of education:  High school       Vitals:   10/30/16 1025  BP: 110/70  Pulse: 82  Resp: 16  O2 sat at RA 98% There is no height or weight on file to calculate BMI.   Physical Exam  Nursing note and vitals reviewed. Constitutional: He is oriented to person, place, and time. He appears well-developed and well-nourished. No distress.  HENT:  Head: Atraumatic.  Mouth/Throat: Oropharynx is clear and moist and mucous membranes are normal.  Eyes: Conjunctivae and EOM are normal.  Cardiovascular: Normal rate and regular rhythm.   Murmur (I-II/VI SEM base) heard. Respiratory: Effort normal and breath sounds normal. No respiratory distress.  GI: Soft. He exhibits no mass. There is no tenderness.  Musculoskeletal: He exhibits no edema or tenderness.  Lymphadenopathy:    He has no cervical adenopathy.  Neurological: He is alert and oriented to person, place, and time. Coordination normal.  In a wheel chair  Skin: Skin is warm. No  rash noted. No erythema.  Right ankle and proximal aspect of foot wrapped with bandage. Small ulcer appreciated on dorsal aspect of 3rd toe. Refused exam of sacral area, difficult for him to stay up or transfer to examination table even with assistance.    Psychiatric: He has a normal mood and affect.  Appropriately groomed,good eye contact.    ASSESSMENT AND PLAN:   Mr. Avondre was seen today for follow-up.  Diagnoses and all orders for this visit:  Lab Results  Component Value Date   WBC 9.4 10/30/2016   HGB 12.6 (L) 10/30/2016   HCT 38.8 (L) 10/30/2016   MCV 83.8 10/30/2016   PLT 210.0 10/30/2016   Lab Results  Component Value Date   HGBA1C 8.6 (H) 10/30/2016    Sacral decubitus ulcer, stage II  HH will be arranged for wound care. Try to avoid postures or movement that can irritate lesion. Ideally pressure needs to be off from affected area. Monitor for signs of infection.  -     Ambulatory referral to Home Health  Type 2 diabetes mellitus with diabetic nephropathy, with long-term current use of insulin (HCC)  No changes in current management, will follow labs done today and will give further recommendations accordingly. Eye exam is overdue. Foot care discussed.  -     Fructosamine -     Hemoglobin A1c  Anemia of chronic disease  Further recommendations will be given according to lab results.  -     CBC  Unstable gait  He is not ambulatory now. Fall precautions discussed.  Constipation, unspecified constipation type  OTC Miralax q 2-3 days. Instructed about warning signs.  Neurogenic bladder  Daughter tells me that he has to pay $40 every month to have urine cath changed. We will find out if service can be provided through [facility]. Also he has difficulty with leaving his house for visits due to pain, mobility status.  -     Ambulatory referral to Home Health  Hypertension with congestive heart failure  and renal failure ([place])  Adequately  controlled. No changes in current management. DASH-low salt diet to continue.  F/U in 5-6 months, before if needed.  -     losartan (COZAAR) 25 MG tablet; TAKE 1/2 TABLET(12.5 MG) BY MOUTH DAILY -     carvedilol (COREG) 3.125 MG tablet; Take 0.5 tablets (1.5625 mg total) by mouth 2 (two) times daily with a meal.     -Mr. KEES IDROVO was advised to return sooner than planned today if new concerns arise.       [name] G. [name], MD  [facility]. [place] office.

## 2016-10-30 ENCOUNTER — Encounter: Payer: Self-pay | Admitting: Family Medicine

## 2016-10-30 ENCOUNTER — Ambulatory Visit (INDEPENDENT_AMBULATORY_CARE_PROVIDER_SITE_OTHER): Payer: Medicare HMO | Admitting: Family Medicine

## 2016-10-30 VITALS — BP 110/70 | HR 82 | Resp 16 | Ht 74.0 in

## 2016-10-30 DIAGNOSIS — L89152 Pressure ulcer of sacral region, stage 2: Secondary | ICD-10-CM

## 2016-10-30 DIAGNOSIS — E1140 Type 2 diabetes mellitus with diabetic neuropathy, unspecified: Secondary | ICD-10-CM | POA: Diagnosis not present

## 2016-10-30 DIAGNOSIS — D638 Anemia in other chronic diseases classified elsewhere: Secondary | ICD-10-CM

## 2016-10-30 DIAGNOSIS — E1121 Type 2 diabetes mellitus with diabetic nephropathy: Secondary | ICD-10-CM | POA: Diagnosis not present

## 2016-10-30 DIAGNOSIS — I25119 Atherosclerotic heart disease of native coronary artery with unspecified angina pectoris: Secondary | ICD-10-CM | POA: Diagnosis not present

## 2016-10-30 DIAGNOSIS — N319 Neuromuscular dysfunction of bladder, unspecified: Secondary | ICD-10-CM | POA: Diagnosis not present

## 2016-10-30 DIAGNOSIS — E11621 Type 2 diabetes mellitus with foot ulcer: Secondary | ICD-10-CM | POA: Diagnosis not present

## 2016-10-30 DIAGNOSIS — I130 Hypertensive heart and chronic kidney disease with heart failure and stage 1 through stage 4 chronic kidney disease, or unspecified chronic kidney disease: Secondary | ICD-10-CM

## 2016-10-30 DIAGNOSIS — Z794 Long term (current) use of insulin: Secondary | ICD-10-CM | POA: Diagnosis not present

## 2016-10-30 DIAGNOSIS — I252 Old myocardial infarction: Secondary | ICD-10-CM | POA: Diagnosis not present

## 2016-10-30 DIAGNOSIS — L97412 Non-pressure chronic ulcer of right heel and midfoot with fat layer exposed: Secondary | ICD-10-CM | POA: Diagnosis not present

## 2016-10-30 DIAGNOSIS — G4730 Sleep apnea, unspecified: Secondary | ICD-10-CM | POA: Diagnosis not present

## 2016-10-30 DIAGNOSIS — I5031 Acute diastolic (congestive) heart failure: Secondary | ICD-10-CM | POA: Diagnosis not present

## 2016-10-30 DIAGNOSIS — R2681 Unsteadiness on feet: Secondary | ICD-10-CM | POA: Diagnosis not present

## 2016-10-30 DIAGNOSIS — K5900 Constipation, unspecified: Secondary | ICD-10-CM | POA: Diagnosis not present

## 2016-10-30 LAB — CBC
HEMATOCRIT: 38.8 % — AB (ref 39.0–52.0)
Hemoglobin: 12.6 g/dL — ABNORMAL LOW (ref 13.0–17.0)
MCHC: 32.5 g/dL (ref 30.0–36.0)
MCV: 83.8 fl (ref 78.0–100.0)
Platelets: 210 10*3/uL (ref 150.0–400.0)
RBC: 4.63 Mil/uL (ref 4.22–5.81)
RDW: 17.4 % — AB (ref 11.5–15.5)
WBC: 9.4 10*3/uL (ref 4.0–10.5)

## 2016-10-30 LAB — HEMOGLOBIN A1C: Hgb A1c MFr Bld: 8.6 % — ABNORMAL HIGH (ref 4.6–6.5)

## 2016-10-30 NOTE — Patient Instructions (Signed)
A few things to remember from today's visit:   Type 2 diabetes mellitus with diabetic nephropathy, with long-term current use of insulin (HCC) - Plan: Fructosamine, Hemoglobin A1c  CKD (chronic kidney disease) stage 3, GFR 30-59 ml/min - Plan: CBC  Unstable gait  Constipation, unspecified constipation type  Miralax q 2 days as needed.  Rest no changes.   Please be sure medication list is accurate. If a new problem present, please set up appointment sooner than planned today.

## 2016-11-01 LAB — FRUCTOSAMINE: Fructosamine: 381 umol/L — ABNORMAL HIGH (ref 190–270)

## 2016-11-02 ENCOUNTER — Other Ambulatory Visit: Payer: Self-pay

## 2016-11-02 ENCOUNTER — Other Ambulatory Visit: Payer: Self-pay | Admitting: Family Medicine

## 2016-11-02 DIAGNOSIS — I5022 Chronic systolic (congestive) heart failure: Secondary | ICD-10-CM | POA: Diagnosis not present

## 2016-11-02 DIAGNOSIS — E1122 Type 2 diabetes mellitus with diabetic chronic kidney disease: Secondary | ICD-10-CM | POA: Diagnosis not present

## 2016-11-02 DIAGNOSIS — I2510 Atherosclerotic heart disease of native coronary artery without angina pectoris: Secondary | ICD-10-CM | POA: Diagnosis not present

## 2016-11-02 DIAGNOSIS — M6281 Muscle weakness (generalized): Secondary | ICD-10-CM | POA: Diagnosis not present

## 2016-11-02 DIAGNOSIS — R278 Other lack of coordination: Secondary | ICD-10-CM | POA: Diagnosis not present

## 2016-11-02 DIAGNOSIS — Z4789 Encounter for other orthopedic aftercare: Secondary | ICD-10-CM | POA: Diagnosis not present

## 2016-11-02 DIAGNOSIS — R262 Difficulty in walking, not elsewhere classified: Secondary | ICD-10-CM | POA: Diagnosis not present

## 2016-11-02 MED ORDER — INSULIN GLARGINE 100 UNIT/ML ~~LOC~~ SOLN: 15.0000 [IU] | Freq: Every day | SUBCUTANEOUS | Status: DC

## 2016-11-03 MED ORDER — LOSARTAN POTASSIUM 25 MG PO TABS: ORAL_TABLET | ORAL | 2 refills | Status: DC

## 2016-11-03 MED ORDER — CARVEDILOL 3.125 MG PO TABS: 1.5625 mg | ORAL_TABLET | Freq: Two times a day (BID) | ORAL | 2 refills | Status: DC

## 2016-11-06 DIAGNOSIS — G4730 Sleep apnea, unspecified: Secondary | ICD-10-CM | POA: Diagnosis not present

## 2016-11-06 DIAGNOSIS — I5031 Acute diastolic (congestive) heart failure: Secondary | ICD-10-CM | POA: Diagnosis not present

## 2016-11-06 DIAGNOSIS — I70203 Unspecified atherosclerosis of native arteries of extremities, bilateral legs: Secondary | ICD-10-CM | POA: Diagnosis not present

## 2016-11-06 DIAGNOSIS — L89322 Pressure ulcer of left buttock, stage 2: Secondary | ICD-10-CM | POA: Diagnosis not present

## 2016-11-06 DIAGNOSIS — E1140 Type 2 diabetes mellitus with diabetic neuropathy, unspecified: Secondary | ICD-10-CM | POA: Diagnosis not present

## 2016-11-06 DIAGNOSIS — I482 Chronic atrial fibrillation: Secondary | ICD-10-CM | POA: Diagnosis not present

## 2016-11-06 DIAGNOSIS — E11621 Type 2 diabetes mellitus with foot ulcer: Secondary | ICD-10-CM | POA: Diagnosis not present

## 2016-11-06 DIAGNOSIS — L97412 Non-pressure chronic ulcer of right heel and midfoot with fat layer exposed: Secondary | ICD-10-CM | POA: Diagnosis not present

## 2016-11-06 DIAGNOSIS — I130 Hypertensive heart and chronic kidney disease with heart failure and stage 1 through stage 4 chronic kidney disease, or unspecified chronic kidney disease: Secondary | ICD-10-CM | POA: Diagnosis not present

## 2016-11-06 DIAGNOSIS — I252 Old myocardial infarction: Secondary | ICD-10-CM | POA: Diagnosis not present

## 2016-11-06 DIAGNOSIS — I5042 Chronic combined systolic (congestive) and diastolic (congestive) heart failure: Secondary | ICD-10-CM | POA: Diagnosis not present

## 2016-11-06 DIAGNOSIS — E1122 Type 2 diabetes mellitus with diabetic chronic kidney disease: Secondary | ICD-10-CM | POA: Diagnosis not present

## 2016-11-06 DIAGNOSIS — I2510 Atherosclerotic heart disease of native coronary artery without angina pectoris: Secondary | ICD-10-CM | POA: Diagnosis not present

## 2016-11-06 DIAGNOSIS — I25119 Atherosclerotic heart disease of native coronary artery with unspecified angina pectoris: Secondary | ICD-10-CM | POA: Diagnosis not present

## 2016-11-06 DIAGNOSIS — N183 Chronic kidney disease, stage 3 (moderate): Secondary | ICD-10-CM | POA: Diagnosis not present

## 2016-11-06 DIAGNOSIS — L89312 Pressure ulcer of right buttock, stage 2: Secondary | ICD-10-CM | POA: Diagnosis not present

## 2016-11-08 IMAGING — DX DG CHEST 2V
2 series · 2 of 2 positions shown · non-contrast
Comparison: Chest x-rays dated 11/28/2015 and 11/04/2015.

CLINICAL DATA: Fall yesterday, hip fracture.

EXAM:
CHEST  2 VIEW

[chest lat]
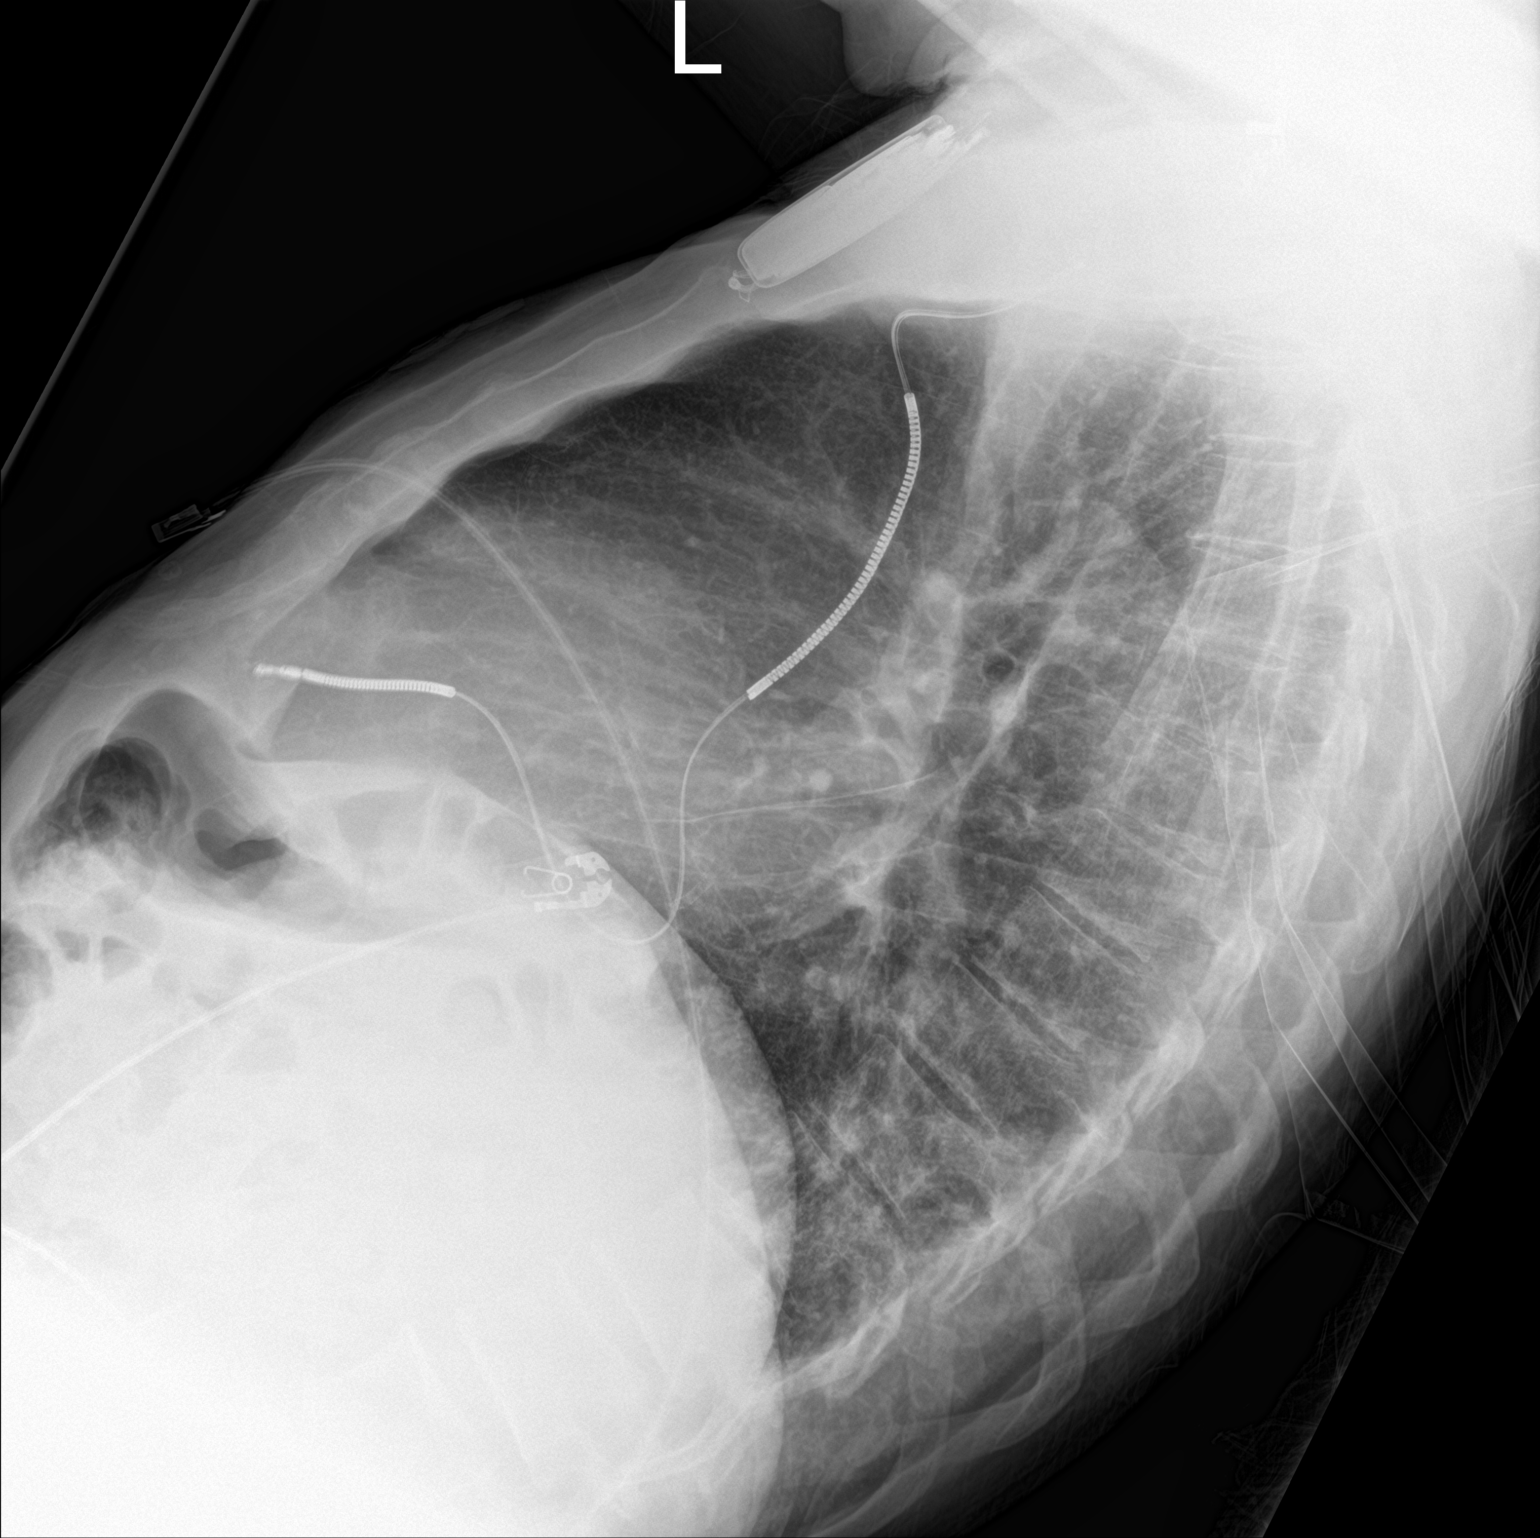

[chest ap]
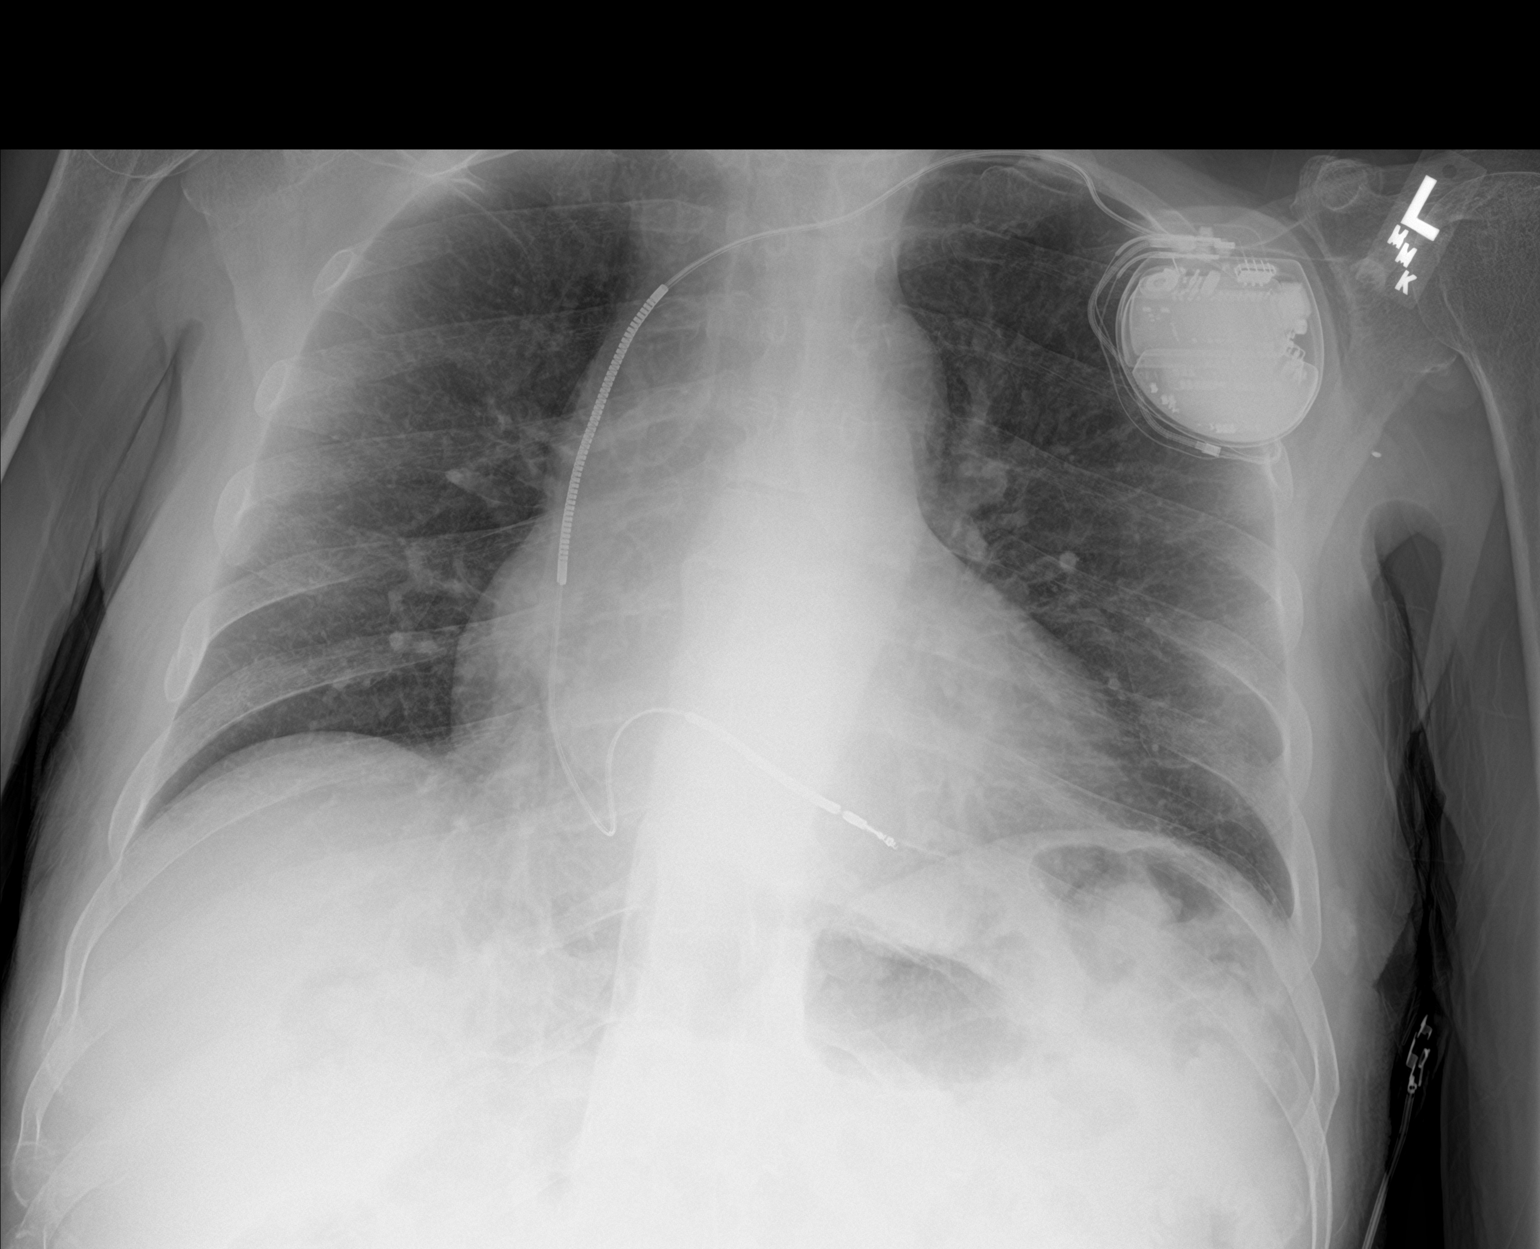

[2 of 2 positions shown; findings below may reference images not displayed]

FINDINGS: Mild cardiomegaly is stable. Left chest wall pacemaker/ICD in place.
Lungs are clear. No evidence of pneumonia. No pleural effusion or
pneumothorax seen. Osseous structures about the chest are
unremarkable.
IMPRESSION: No active cardiopulmonary disease.  Stable mild cardiomegaly.

## 2016-11-09 ENCOUNTER — Telehealth: Payer: Self-pay | Admitting: Family Medicine

## 2016-11-09 DIAGNOSIS — I130 Hypertensive heart and chronic kidney disease with heart failure and stage 1 through stage 4 chronic kidney disease, or unspecified chronic kidney disease: Secondary | ICD-10-CM | POA: Diagnosis not present

## 2016-11-09 DIAGNOSIS — I482 Chronic atrial fibrillation: Secondary | ICD-10-CM | POA: Diagnosis not present

## 2016-11-09 DIAGNOSIS — L89322 Pressure ulcer of left buttock, stage 2: Secondary | ICD-10-CM | POA: Diagnosis not present

## 2016-11-09 DIAGNOSIS — N183 Chronic kidney disease, stage 3 (moderate): Secondary | ICD-10-CM | POA: Diagnosis not present

## 2016-11-09 DIAGNOSIS — L89312 Pressure ulcer of right buttock, stage 2: Secondary | ICD-10-CM | POA: Diagnosis not present

## 2016-11-09 DIAGNOSIS — I5042 Chronic combined systolic (congestive) and diastolic (congestive) heart failure: Secondary | ICD-10-CM | POA: Diagnosis not present

## 2016-11-09 DIAGNOSIS — I70203 Unspecified atherosclerosis of native arteries of extremities, bilateral legs: Secondary | ICD-10-CM | POA: Diagnosis not present

## 2016-11-09 DIAGNOSIS — I2510 Atherosclerotic heart disease of native coronary artery without angina pectoris: Secondary | ICD-10-CM | POA: Diagnosis not present

## 2016-11-09 DIAGNOSIS — E1122 Type 2 diabetes mellitus with diabetic chronic kidney disease: Secondary | ICD-10-CM | POA: Diagnosis not present

## 2016-11-09 NOTE — Telephone Encounter (Signed)
[name] [name] with [facility] is calling stating that wife said that pt had diarrhea last night and saw a little blood.  Pt is still has diarrhea this morning but has not seen any blood pts bottom is a little red from the diarrhea.

## 2016-11-09 NOTE — Telephone Encounter (Signed)
Monitor diarrhea, adequate hydration, and apply Desitin on area of erythema. Thanks, BJ

## 2016-11-12 DIAGNOSIS — I2510 Atherosclerotic heart disease of native coronary artery without angina pectoris: Secondary | ICD-10-CM | POA: Diagnosis not present

## 2016-11-12 DIAGNOSIS — I70203 Unspecified atherosclerosis of native arteries of extremities, bilateral legs: Secondary | ICD-10-CM | POA: Diagnosis not present

## 2016-11-12 DIAGNOSIS — L89322 Pressure ulcer of left buttock, stage 2: Secondary | ICD-10-CM | POA: Diagnosis not present

## 2016-11-12 DIAGNOSIS — L89312 Pressure ulcer of right buttock, stage 2: Secondary | ICD-10-CM | POA: Diagnosis not present

## 2016-11-12 DIAGNOSIS — I482 Chronic atrial fibrillation: Secondary | ICD-10-CM | POA: Diagnosis not present

## 2016-11-12 DIAGNOSIS — N183 Chronic kidney disease, stage 3 (moderate): Secondary | ICD-10-CM | POA: Diagnosis not present

## 2016-11-12 DIAGNOSIS — I130 Hypertensive heart and chronic kidney disease with heart failure and stage 1 through stage 4 chronic kidney disease, or unspecified chronic kidney disease: Secondary | ICD-10-CM | POA: Diagnosis not present

## 2016-11-12 DIAGNOSIS — E1122 Type 2 diabetes mellitus with diabetic chronic kidney disease: Secondary | ICD-10-CM | POA: Diagnosis not present

## 2016-11-12 DIAGNOSIS — I5042 Chronic combined systolic (congestive) and diastolic (congestive) heart failure: Secondary | ICD-10-CM | POA: Diagnosis not present

## 2016-11-12 NOTE — Telephone Encounter (Signed)
Spoke with [name] [name] and advised. She has home visit today and will call with any further issues. Nothing further needed at this time.

## 2016-11-13 ENCOUNTER — Ambulatory Visit (INDEPENDENT_AMBULATORY_CARE_PROVIDER_SITE_OTHER): Payer: Medicare HMO | Admitting: Internal Medicine

## 2016-11-13 ENCOUNTER — Encounter: Payer: Self-pay | Admitting: Internal Medicine

## 2016-11-13 VITALS — BP 94/60 | HR 77 | Ht 74.0 in

## 2016-11-13 DIAGNOSIS — Z95810 Presence of automatic (implantable) cardiac defibrillator: Secondary | ICD-10-CM

## 2016-11-13 DIAGNOSIS — I4901 Ventricular fibrillation: Secondary | ICD-10-CM | POA: Diagnosis not present

## 2016-11-13 DIAGNOSIS — I255 Ischemic cardiomyopathy: Secondary | ICD-10-CM

## 2016-11-13 DIAGNOSIS — R000 Tachycardia, unspecified: Secondary | ICD-10-CM | POA: Diagnosis not present

## 2016-11-13 DIAGNOSIS — I5022 Chronic systolic (congestive) heart failure: Secondary | ICD-10-CM | POA: Diagnosis not present

## 2016-11-13 DIAGNOSIS — E11621 Type 2 diabetes mellitus with foot ulcer: Secondary | ICD-10-CM | POA: Diagnosis not present

## 2016-11-13 DIAGNOSIS — I472 Ventricular tachycardia, unspecified: Secondary | ICD-10-CM

## 2016-11-13 DIAGNOSIS — E1140 Type 2 diabetes mellitus with diabetic neuropathy, unspecified: Secondary | ICD-10-CM | POA: Diagnosis not present

## 2016-11-13 DIAGNOSIS — I5031 Acute diastolic (congestive) heart failure: Secondary | ICD-10-CM | POA: Diagnosis not present

## 2016-11-13 DIAGNOSIS — I252 Old myocardial infarction: Secondary | ICD-10-CM | POA: Diagnosis not present

## 2016-11-13 DIAGNOSIS — G4730 Sleep apnea, unspecified: Secondary | ICD-10-CM | POA: Diagnosis not present

## 2016-11-13 DIAGNOSIS — I25119 Atherosclerotic heart disease of native coronary artery with unspecified angina pectoris: Secondary | ICD-10-CM | POA: Diagnosis not present

## 2016-11-13 DIAGNOSIS — L97412 Non-pressure chronic ulcer of right heel and midfoot with fat layer exposed: Secondary | ICD-10-CM | POA: Diagnosis not present
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
## 2016-11-13 NOTE — Patient Instructions (Signed)
Medication Instructions: Your physician recommends that you continue on your current medications as directed. Please refer to the Current Medication list given to you today.  Labwork: None Ordered  Procedures/Testing: None Ordered  Follow-Up: Remote monitoring is used to monitor your Pacemaker from home. This monitoring reduces the number of office visits required to check your device to one time per year. It allows [place] to keep an eye on the functioning of your device to ensure it is working properly. You are scheduled for a device check from home on 02/12/17. You may send your transmission at any time that day. If you have a wireless device, the transmission will be sent automatically. After your physician reviews your transmission, you will receive a postcard with your next transmission date.  Your physician wants you to follow-up in 1 YEAR with Dr. Caryl Comes. You will receive a reminder letter in the mail two months in advance. If you don't receive a letter, please call our office to schedule the follow-up appointment.       If you need a refill on your cardiac medications before your next appointment, please call your pharmacy.

## 2016-11-15 DIAGNOSIS — L89312 Pressure ulcer of right buttock, stage 2: Secondary | ICD-10-CM | POA: Diagnosis not present

## 2016-11-15 DIAGNOSIS — N183 Chronic kidney disease, stage 3 (moderate): Secondary | ICD-10-CM | POA: Diagnosis not present

## 2016-11-15 DIAGNOSIS — I2510 Atherosclerotic heart disease of native coronary artery without angina pectoris: Secondary | ICD-10-CM | POA: Diagnosis not present

## 2016-11-15 DIAGNOSIS — I5042 Chronic combined systolic (congestive) and diastolic (congestive) heart failure: Secondary | ICD-10-CM | POA: Diagnosis not present

## 2016-11-15 DIAGNOSIS — E1122 Type 2 diabetes mellitus with diabetic chronic kidney disease: Secondary | ICD-10-CM | POA: Diagnosis not present

## 2016-11-15 DIAGNOSIS — I482 Chronic atrial fibrillation: Secondary | ICD-10-CM | POA: Diagnosis not present

## 2016-11-15 DIAGNOSIS — L89322 Pressure ulcer of left buttock, stage 2: Secondary | ICD-10-CM | POA: Diagnosis not present

## 2016-11-15 DIAGNOSIS — I70203 Unspecified atherosclerosis of native arteries of extremities, bilateral legs: Secondary | ICD-10-CM | POA: Diagnosis not present

## 2016-11-15 DIAGNOSIS — I130 Hypertensive heart and chronic kidney disease with heart failure and stage 1 through stage 4 chronic kidney disease, or unspecified chronic kidney disease: Secondary | ICD-10-CM | POA: Diagnosis not present

## 2016-11-20 ENCOUNTER — Telehealth: Payer: Self-pay | Admitting: Family Medicine

## 2016-11-20 DIAGNOSIS — N312 Flaccid neuropathic bladder, not elsewhere classified: Secondary | ICD-10-CM | POA: Diagnosis not present

## 2016-11-20 DIAGNOSIS — I5031 Acute diastolic (congestive) heart failure: Secondary | ICD-10-CM | POA: Diagnosis not present

## 2016-11-20 DIAGNOSIS — I25119 Atherosclerotic heart disease of native coronary artery with unspecified angina pectoris: Secondary | ICD-10-CM | POA: Diagnosis not present

## 2016-11-20 DIAGNOSIS — I252 Old myocardial infarction: Secondary | ICD-10-CM | POA: Diagnosis not present

## 2016-11-20 DIAGNOSIS — G4730 Sleep apnea, unspecified: Secondary | ICD-10-CM | POA: Diagnosis not present

## 2016-11-20 DIAGNOSIS — E11621 Type 2 diabetes mellitus with foot ulcer: Secondary | ICD-10-CM | POA: Diagnosis not present

## 2016-11-20 DIAGNOSIS — L97412 Non-pressure chronic ulcer of right heel and midfoot with fat layer exposed: Secondary | ICD-10-CM | POA: Diagnosis not present

## 2016-11-20 DIAGNOSIS — E1140 Type 2 diabetes mellitus with diabetic neuropathy, unspecified: Secondary | ICD-10-CM | POA: Diagnosis not present

## 2016-11-20 NOTE — Telephone Encounter (Signed)
Olivia Mackie with [facility] is calling stating that the pt missed his appointment due to another appointment at the Urologist.

## 2016-11-20 NOTE — Telephone Encounter (Signed)
FYI

## 2016-11-22 DIAGNOSIS — L89312 Pressure ulcer of right buttock, stage 2: Secondary | ICD-10-CM | POA: Diagnosis not present

## 2016-11-22 DIAGNOSIS — I70203 Unspecified atherosclerosis of native arteries of extremities, bilateral legs: Secondary | ICD-10-CM | POA: Diagnosis not present

## 2016-11-22 DIAGNOSIS — L89322 Pressure ulcer of left buttock, stage 2: Secondary | ICD-10-CM | POA: Diagnosis not present

## 2016-11-22 DIAGNOSIS — I5042 Chronic combined systolic (congestive) and diastolic (congestive) heart failure: Secondary | ICD-10-CM | POA: Diagnosis not present

## 2016-11-22 DIAGNOSIS — I482 Chronic atrial fibrillation: Secondary | ICD-10-CM | POA: Diagnosis not present

## 2016-11-22 DIAGNOSIS — I130 Hypertensive heart and chronic kidney disease with heart failure and stage 1 through stage 4 chronic kidney disease, or unspecified chronic kidney disease: Secondary | ICD-10-CM | POA: Diagnosis not present

## 2016-11-22 DIAGNOSIS — N183 Chronic kidney disease, stage 3 (moderate): Secondary | ICD-10-CM | POA: Diagnosis not present

## 2016-11-22 DIAGNOSIS — I2510 Atherosclerotic heart disease of native coronary artery without angina pectoris: Secondary | ICD-10-CM | POA: Diagnosis not present

## 2016-11-22 DIAGNOSIS — E1122 Type 2 diabetes mellitus with diabetic chronic kidney disease: Secondary | ICD-10-CM | POA: Diagnosis not present

## 2016-11-27 ENCOUNTER — Encounter (HOSPITAL_BASED_OUTPATIENT_CLINIC_OR_DEPARTMENT_OTHER): Payer: Medicare HMO | Attending: Surgery

## 2016-11-27 DIAGNOSIS — E1140 Type 2 diabetes mellitus with diabetic neuropathy, unspecified: Secondary | ICD-10-CM | POA: Insufficient documentation

## 2016-11-27 DIAGNOSIS — I10 Essential (primary) hypertension: Secondary | ICD-10-CM | POA: Diagnosis not present

## 2016-11-27 DIAGNOSIS — L89623 Pressure ulcer of left heel, stage 3: Secondary | ICD-10-CM | POA: Insufficient documentation

## 2016-11-27 DIAGNOSIS — G4730 Sleep apnea, unspecified: Secondary | ICD-10-CM | POA: Insufficient documentation

## 2016-11-27 DIAGNOSIS — L97412 Non-pressure chronic ulcer of right heel and midfoot with fat layer exposed: Secondary | ICD-10-CM | POA: Diagnosis not present

## 2016-11-27 DIAGNOSIS — I2510 Atherosclerotic heart disease of native coronary artery without angina pectoris: Secondary | ICD-10-CM | POA: Diagnosis not present

## 2016-11-27 DIAGNOSIS — E11621 Type 2 diabetes mellitus with foot ulcer: Secondary | ICD-10-CM | POA: Diagnosis not present

## 2016-11-27 DIAGNOSIS — L89610 Pressure ulcer of right heel, unstageable: Secondary | ICD-10-CM | POA: Insufficient documentation

## 2016-11-27 DIAGNOSIS — I252 Old myocardial infarction: Secondary | ICD-10-CM | POA: Insufficient documentation

## 2016-11-29 DIAGNOSIS — I5042 Chronic combined systolic (congestive) and diastolic (congestive) heart failure: Secondary | ICD-10-CM | POA: Diagnosis not present

## 2016-11-29 DIAGNOSIS — E1122 Type 2 diabetes mellitus with diabetic chronic kidney disease: Secondary | ICD-10-CM | POA: Diagnosis not present

## 2016-11-29 DIAGNOSIS — I482 Chronic atrial fibrillation: Secondary | ICD-10-CM | POA: Diagnosis not present

## 2016-11-29 DIAGNOSIS — I130 Hypertensive heart and chronic kidney disease with heart failure and stage 1 through stage 4 chronic kidney disease, or unspecified chronic kidney disease: Secondary | ICD-10-CM | POA: Diagnosis not present

## 2016-11-29 DIAGNOSIS — N183 Chronic kidney disease, stage 3 (moderate): Secondary | ICD-10-CM | POA: Diagnosis not present

## 2016-11-29 DIAGNOSIS — L89322 Pressure ulcer of left buttock, stage 2: Secondary | ICD-10-CM | POA: Diagnosis not present

## 2016-11-29 DIAGNOSIS — I2510 Atherosclerotic heart disease of native coronary artery without angina pectoris: Secondary | ICD-10-CM | POA: Diagnosis not present

## 2016-11-29 DIAGNOSIS — L89312 Pressure ulcer of right buttock, stage 2: Secondary | ICD-10-CM | POA: Diagnosis not present

## 2016-11-29 DIAGNOSIS — I70203 Unspecified atherosclerosis of native arteries of extremities, bilateral legs: Secondary | ICD-10-CM | POA: Diagnosis not present

## 2016-12-03 ENCOUNTER — Other Ambulatory Visit: Payer: Self-pay | Admitting: Family Medicine

## 2016-12-03 DIAGNOSIS — R278 Other lack of coordination: Secondary | ICD-10-CM | POA: Diagnosis not present

## 2016-12-03 DIAGNOSIS — I2510 Atherosclerotic heart disease of native coronary artery without angina pectoris: Secondary | ICD-10-CM | POA: Diagnosis not present

## 2016-12-03 DIAGNOSIS — M6281 Muscle weakness (generalized): Secondary | ICD-10-CM | POA: Diagnosis not present

## 2016-12-03 DIAGNOSIS — R262 Difficulty in walking, not elsewhere classified: Secondary | ICD-10-CM | POA: Diagnosis not present

## 2016-12-03 DIAGNOSIS — I5022 Chronic systolic (congestive) heart failure: Secondary | ICD-10-CM | POA: Diagnosis not present

## 2016-12-03 DIAGNOSIS — E1122 Type 2 diabetes mellitus with diabetic chronic kidney disease: Secondary | ICD-10-CM | POA: Diagnosis not present

## 2016-12-03 DIAGNOSIS — Z4789 Encounter for other orthopedic aftercare: Secondary | ICD-10-CM | POA: Diagnosis not present

## 2016-12-04 ENCOUNTER — Ambulatory Visit (INDEPENDENT_AMBULATORY_CARE_PROVIDER_SITE_OTHER): Payer: Medicare HMO

## 2016-12-04 ENCOUNTER — Ambulatory Visit (INDEPENDENT_AMBULATORY_CARE_PROVIDER_SITE_OTHER): Payer: Medicare HMO | Admitting: Family Medicine

## 2016-12-04 ENCOUNTER — Encounter: Payer: Self-pay | Admitting: Family Medicine

## 2016-12-04 VITALS — BP 90/60 | Temp 98.4°F | Resp 12 | Ht 74.0 in | Wt 180.0 lb

## 2016-12-04 VITALS — BP 90/60 | HR 67 | Temp 98.4°F | Ht 74.0 in | Wt 180.0 lb

## 2016-12-04 DIAGNOSIS — E1140 Type 2 diabetes mellitus with diabetic neuropathy, unspecified: Secondary | ICD-10-CM

## 2016-12-04 DIAGNOSIS — N183 Chronic kidney disease, stage 3 (moderate): Secondary | ICD-10-CM | POA: Diagnosis not present

## 2016-12-04 DIAGNOSIS — L304 Erythema intertrigo: Secondary | ICD-10-CM

## 2016-12-04 DIAGNOSIS — E1122 Type 2 diabetes mellitus with diabetic chronic kidney disease: Secondary | ICD-10-CM | POA: Diagnosis not present

## 2016-12-04 DIAGNOSIS — Z0000 Encounter for general adult medical examination without abnormal findings: Secondary | ICD-10-CM

## 2016-12-04 DIAGNOSIS — L89610 Pressure ulcer of right heel, unstageable: Secondary | ICD-10-CM | POA: Diagnosis not present

## 2016-12-04 DIAGNOSIS — Z794 Long term (current) use of insulin: Secondary | ICD-10-CM

## 2016-12-04 DIAGNOSIS — L89613 Pressure ulcer of right heel, stage 3: Secondary | ICD-10-CM | POA: Diagnosis not present

## 2016-12-04 DIAGNOSIS — E1165 Type 2 diabetes mellitus with hyperglycemia: Secondary | ICD-10-CM

## 2016-12-04 DIAGNOSIS — I5042 Chronic combined systolic (congestive) and diastolic (congestive) heart failure: Secondary | ICD-10-CM | POA: Diagnosis not present

## 2016-12-04 DIAGNOSIS — I70203 Unspecified atherosclerosis of native arteries of extremities, bilateral legs: Secondary | ICD-10-CM | POA: Diagnosis not present

## 2016-12-04 DIAGNOSIS — I130 Hypertensive heart and chronic kidney disease with heart failure and stage 1 through stage 4 chronic kidney disease, or unspecified chronic kidney disease: Secondary | ICD-10-CM | POA: Diagnosis not present

## 2016-12-04 DIAGNOSIS — I2510 Atherosclerotic heart disease of native coronary artery without angina pectoris: Secondary | ICD-10-CM | POA: Diagnosis not present

## 2016-12-04 DIAGNOSIS — L89623 Pressure ulcer of left heel, stage 3: Secondary | ICD-10-CM | POA: Diagnosis not present

## 2016-12-04 DIAGNOSIS — IMO0002 Reserved for concepts with insufficient information to code with codable children: Secondary | ICD-10-CM

## 2016-12-04 DIAGNOSIS — I482 Chronic atrial fibrillation: Secondary | ICD-10-CM | POA: Diagnosis not present

## 2016-12-04 DIAGNOSIS — I252 Old myocardial infarction: Secondary | ICD-10-CM | POA: Diagnosis not present

## 2016-12-04 DIAGNOSIS — L89312 Pressure ulcer of right buttock, stage 2: Secondary | ICD-10-CM | POA: Diagnosis not present

## 2016-12-04 DIAGNOSIS — L89322 Pressure ulcer of left buttock, stage 2: Secondary | ICD-10-CM | POA: Diagnosis not present

## 2016-12-04 DIAGNOSIS — Z1159 Encounter for screening for other viral diseases: Secondary | ICD-10-CM

## 2016-12-04 DIAGNOSIS — I10 Essential (primary) hypertension: Secondary | ICD-10-CM | POA: Diagnosis not present

## 2016-12-04 DIAGNOSIS — G4730 Sleep apnea, unspecified: Secondary | ICD-10-CM | POA: Diagnosis not present

## 2016-12-04 MED ORDER — CLOTRIMAZOLE-BETAMETHASONE 1-0.05 % EX CREA: 1.0000 "application " | TOPICAL_CREAM | Freq: Two times a day (BID) | CUTANEOUS | 0 refills | Status: DC
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
## 2016-12-04 NOTE — Progress Notes (Signed)
ACUTE VISIT   HPI:  Chief Complaint  Patient presents with  . Rash    Mr.Estes [name] [name] is a 71 y.o. male, who is here today with his daughter having Medicare Preventive visit. I was asked to evaluate Mr Henes while he was here in the office. He is omplaining of "very erythematosus", pruritic, and nontender rash on lower abdomen and inguinal area.   He noticed rash a few days ago. It seems to be stable. He denies purulent discharge on area but this morning he noted that the area was "wet", clear fluid.  He has hx of neurogenic bladder and has a urine cath in place. Non-ambulatory due to peripheral neuropathy and PAD.  Hx of DM type II and decubitus ulcer, he has wound care through [facility] and wound clinic.  He denies new exposures. Denies prior history of similar rash.  Rash  This is a recurrent problem. The current episode started in the past 7 days. The problem is unchanged. The affected locations include the abdomen and groin. The rash is characterized by redness and itchiness. He was exposed to nothing. Pertinent negatives include no anorexia, fever, shortness of breath, sore throat or vomiting. Treatments tried: Lotrimin OTC. The treatment provided mild relief.  He is not sure about exacerbating or alleviating factors.  He denies associated chills, fever, abdominal pain, changes in bowel habits, or urinary symptoms.   Review of Systems  Constitutional: Negative for activity change, appetite change, chills and fever.  HENT: Negative for mouth sores, sore throat and trouble swallowing.   Respiratory: Negative for shortness of breath and wheezing.   Gastrointestinal: Negative for abdominal pain, anorexia, nausea and vomiting.       No changes in bowel habits.  Genitourinary: Negative for decreased urine volume, dysuria and hematuria.  Musculoskeletal: Positive for gait problem.  Skin: Positive for rash.  Psychiatric/Behavioral: Negative for confusion and  hallucinations.      Current Outpatient Prescriptions on File Prior to Visit  Medication Sig Dispense Refill  . amiodarone (PACERONE) 200 MG tablet Take 1 tablet (200 mg total) by mouth daily. 30 tablet 3  . Ascorbic Acid (VITAMIN C PO) Take 1 tablet by mouth 2 (two) times daily.    Marland Kitchen aspirin EC 81 MG tablet Take 81 mg by mouth daily.    . carvedilol (COREG) 3.125 MG tablet Take 0.5 tablets (1.5625 mg total) by mouth 2 (two) times daily with a meal. 45 tablet 2  . carvedilol (COREG) 3.125 MG tablet TAKE 1/2 TABLET BY MOUTH TWICE DAILY 90 tablet 1  . clopidogrel (PLAVIX) 75 MG tablet TAKE 1 TABLET(75 MG) BY MOUTH DAILY 30 tablet 11  . finasteride (PROSCAR) 5 MG tablet Take 5 mg by mouth daily.     Marland Kitchen HYDROcodone-acetaminophen (NORCO/VICODIN) 5-325 MG tablet Take 1 tablet by mouth 2 (two) times daily as needed for moderate pain. 50 tablet 0  . insulin glargine (LANTUS) 100 UNIT/ML injection Inject 0.15 mLs (15 Units total) into the skin daily with supper. 10 mL   . losartan (COZAAR) 25 MG tablet TAKE 1/2 TABLET(12.5 MG) BY MOUTH DAILY 45 tablet 2  . MAGNESIUM PO Take 1 tablet by mouth every other day.    . mirtazapine (REMERON) 15 MG tablet TAKE 1 TABLET(15 MG) BY MOUTH AT BEDTIME 90 tablet 1  . multivitamin-iron-minerals-folic acid (THERAPEUTIC-M) TABS tablet Take 1 tablet by mouth daily.    . Probiotic Product (PROBIOTIC PO) Take 1 capsule by mouth daily.    Marland Kitchen  rosuvastatin (CRESTOR) 40 MG tablet Take 40 mg by mouth daily.    . tamsulosin (FLOMAX) 0.4 MG CAPS capsule TAKE ONE CAPSULE BY MOUTH DAILY 90 capsule 2  . torsemide (DEMADEX) 20 MG tablet Take 1 tablet (20 mg total) by mouth every other day. Start on Monday 03/12/2016 30 tablet 0  . vitamin A 10000 UNIT capsule Take 10,000 Units by mouth daily.    Marland Kitchen zinc sulfate 220 (50 Zn) MG capsule Take 220 mg by mouth daily.    . Amino Acids-Protein Hydrolys (FEEDING SUPPLEMENT, PRO-STAT SUGAR FREE 64,) LIQD Take 30 mLs by mouth 2 (two) times  daily. (Patient not taking: Reported on 12/04/2016) 900 mL 0   No current facility-administered medications on file prior to visit.      Past Medical History:  Diagnosis Date  . Automatic implantable cardioverter-defibrillator in situ   . CAD (coronary artery disease)    a. LHC (08/2005):  Ostial LAD 99%, mid LAD 50%, superior D1 80%, inferior D1 75%, mid OM proximal 80%, proximal PDA 25-30%, mid RCA 99%.   MRI with full thickness scar.  Med Rx.  2015 demonstrated similar diffuse three-vessel coronary disease. Perfusion imaging with rest we distribution demonstrated no viability in the area of the LAD and circumflex. Medical management.  . CAD, multiple vessel, RCA 100% mid occl.; LAD 99% stenosisprox.  mid of 50-60%; LCX OM-1 90%, OM-2 99%,AV groove 80%  12/10/2013  . Chronic combined systolic and diastolic heart failure ([place])   . CKD (chronic kidney disease)   . Gout   . Hx of echocardiogram 2015   Echo (08/2013):  EF 10-15%, diff HK, mod LAE, severe RVE, mod reduced RVSF, mild RAE, PASP 41 mmHg.  Marland Kitchen Hypertension   . Ischemic cardiomyopathy    Echo (8/11):  EF 40-45%;  Echo (5/15):  EF 10-15%  . Myocardial infarction [facility]) 2007   out of hosp MI - LHC with 3v CAD rx medically  . NSVT (nonsustained ventricular tachycardia) ([facility])    s/p ICD  . Peripheral neuropathy   . Sleep apnea    "has CPAP; won't use it"  . Stroke [facility]) ~ 2013   "right leg weak since" (12/09/2013)  . Type II diabetes mellitus ([place]) dx'd 2005   Allergies  Allergen Reactions  . Sulfa Antibiotics Rash    Social History   Social History  . Marital status: Married    Spouse name: N/A  . Number of children: N/A  . Years of education: N/A   Occupational History  . retired    Social History Main Topics  . Smoking status: Never Smoker  . Smokeless tobacco: Never Used  . Alcohol use No  . Drug use: No  . Sexual activity: Not Asked   Other Topics Concern  . None   Social History Narrative   He is retired  Veterinary surgeon for [employer].  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   He lives with wife.   Highest level of education:  High school       Vitals:   12/04/16 1157  BP: 90/60  Resp: 12  Temp: 98.4 F (36.9 C)   Body mass index is 23.11 kg/m.   Physical Exam  Nursing note and vitals reviewed. Constitutional: He appears well-developed. He does not appear ill. No distress.  HENT:  Head: Normocephalic and atraumatic.  Eyes: Conjunctivae are normal.  Respiratory: Effort normal. No respiratory distress.  GI: Soft. There is  no tenderness.  Neurological: He is alert.  He is in his wheeler chair.  Skin: Skin is warm. Rash noted.  Erythematosus, macular skin rash on lower abdomen: Suprapubic and inguinal area. Rash is not tender, not indurated, and I don't appreciate drainage.    Psychiatric: He has a normal mood and affect.  Fairly groomed, good eye contact.      ASSESSMENT AND PLAN:   [place] was seen today for rash.  Diagnoses and all orders for this visit:  Intertrigo -     clotrimazole-betamethasone (LOTRISONE) cream; Apply 1 application topically 2 (two) times daily. 14 days at the time, small amount.    We discussed diagnoses and prognosis. Instructed to keep area clean with soap and water as well as dry. Monitor for signs of infection. Topical treatment recommended, instructed about warning signs.  I was planning on ordering Diflucan as well but he is on Amiodarone,  there is a risk for cardiac arrhythmias with this combination. F/U as needed.   -Mr.KIEN MIRSKY was advised to seek immediate medical attention if sudden worsening symptoms.      Linford Quintela G. [name], MD  [facility]. [place] office.

## 2016-12-04 NOTE — Patient Instructions (Addendum)
[name] [name] , Thank you for taking time to come for your Medicare Wellness Visit. I appreciate your ongoing commitment to your health goals. Please review the following plan we discussed and let me know if I can assist you in the future.    Will try to complete AD; Given copy  Referred to [facility] for questions [place] offers free advance directive forms, as well as assistance in completing the forms themselves. For assistance, contact the Spiritual Care Department at [phone], or the Clinical Social Work Department at [phone].  Will make a referral for an eye exam     Diabetes and weight loss; [facility]  Phone: [phone]   [place] [facility]; [phone] [address] [place]; [phone] Get resource to get information on any and all community programs for Seniors  [place]: [phone] Community Health Response Program -371-062-6948 Public Health Dept; Need to be a skilled visit but can assist with bathing as well; [phone]  Adult center for Enrichment;  Call Senior Line; [phone]  Adult day services include Adult Day Care, Adult Day Healthcare, Group Respite, Care Partners, Volunteer In [employer], Education and Support Program     Dept of Social Services; Call [phone] and ask for SW on call  Options for Medicaid include the Community Alternatives program; [place]org (personal care services) or PACE program, which is a medical and social program combined  Braulio Conte manages the community Alternatives program at the [employer]; 017-510-2585 (this is a program with a waiting list but provides SNF care at home;  [facility] 2 required; Call Claiborne Billings and she will send out packet of information   Caregiver support group and information regarding [place] is at the; [facility] Address: [address], [place],  [address]  Phone: [phone]   [employer] general resources for food etc   Http://nihseniorhealth.giv  Deaf & Hard of Hearing Division Services - can assist with hearing aid x 1  No reviews  [place]  [address]  [phone]  Resource to find disability equipment (used) online; [employer]   These are the goals we discussed: Goals    . patient          Maintain his health        This is a list of the screening recommended for you and due dates:  Health Maintenance  Topic Date Due  .  Hepatitis C: One time screening is recommended by Center for Disease Control  (CDC) for  adults born from [birth year] through 1964.   [birth year]  . Eye exam for diabetics  07/04/1955  . Flu Shot  11/21/2016  . Complete foot exam   04/22/2017*  . Pneumonia vaccines (2 of 2 - PCV13) 12/18/2016  . Hemoglobin A1C  05/02/2017  . Tetanus Vaccine  06/15/2018  . Colon Cancer Screening  08/21/2018  *Topic was postponed. The date shown is not the original due date.        Fall Prevention in the Home Falls can cause injuries. They can happen to people of all ages. There are many things you can do to make your home safe and to help prevent falls. What can I do on the outside of my home?  Regularly fix the edges of walkways and driveways and fix any cracks.  Remove anything that might make you trip as you walk through a door, such as a raised step or threshold.  Trim any bushes or trees  on the path to your home.  Use bright outdoor lighting.  Clear any walking paths of anything that might make someone trip, such as rocks or tools.  Regularly check to see if handrails are loose or broken. Make sure that both sides of any steps have handrails.  Any raised decks and porches should have guardrails on the edges.  Have any leaves, snow, or ice cleared regularly.  Use sand or salt on walking paths during winter.  Clean up any spills in your  garage right away. This includes oil or grease spills. What can I do in the bathroom?  Use night lights.  Install grab bars by the toilet and in the tub and shower. Do not use towel bars as grab bars.  Use non-skid mats or decals in the tub or shower.  If you need to sit down in the shower, use a plastic, non-slip stool.  Keep the floor dry. Clean up any water that spills on the floor as soon as it happens.  Remove soap buildup in the tub or shower regularly.  Attach bath mats securely with double-sided non-slip rug tape.  Do not have throw rugs and other things on the floor that can make you trip. What can I do in the bedroom?  Use night lights.  Make sure that you have a light by your bed that is easy to reach.  Do not use any sheets or blankets that are too big for your bed. They should not hang down onto the floor.  Have a firm chair that has side arms. You can use this for support while you get dressed.  Do not have throw rugs and other things on the floor that can make you trip. What can I do in the kitchen?  Clean up any spills right away.  Avoid walking on wet floors.  Keep items that you use a lot in easy-to-reach places.  If you need to reach something above you, use a strong step stool that has a grab bar.  Keep electrical cords out of the way.  Do not use floor polish or wax that makes floors slippery. If you must use wax, use non-skid floor wax.  Do not have throw rugs and other things on the floor that can make you trip. What can I do with my stairs?  Do not leave any items on the stairs.  Make sure that there are handrails on both sides of the stairs and use them. Fix handrails that are broken or loose. Make sure that handrails are as long as the stairways.  Check any carpeting to make sure that it is firmly attached to the stairs. Fix any carpet that is loose or worn.  Avoid having throw rugs at the top or bottom of the stairs. If you do have throw  rugs, attach them to the floor with carpet tape.  Make sure that you have a light switch at the top of the stairs and the bottom of the stairs. If you do not have them, ask someone to add them for you. What else can I do to help prevent falls?  Wear shoes that: ? Do not have high heels. ? Have rubber bottoms. ? Are comfortable and fit you well. ? Are closed at the toe. Do not wear sandals.  If you use a stepladder: ? Make sure that it is fully opened. Do not climb a closed stepladder. ? Make sure that both sides of the stepladder are locked into place. ?  Ask someone to hold it for you, if possible.  Clearly mark and make sure that you can see: ? Any grab bars or handrails. ? First and last steps. ? Where the edge of each step is.  Use tools that help you move around (mobility aids) if they are needed. These include: ? Canes. ? Walkers. ? Scooters. ? Crutches.  Turn on the lights when you go into a dark area. Replace any light bulbs as soon as they burn out.  Set up your furniture so you have a clear path. Avoid moving your furniture around.  If any of your floors are uneven, fix them.  If there are any pets around you, be aware of where they are.  Review your medicines with your doctor. Some medicines can make you feel dizzy. This can increase your chance of falling. Ask your doctor what other things that you can do to help prevent falls. This information is not intended to replace advice given to you by your health care provider. Make sure you discuss any questions you have with your health care provider. Document Released: 02/03/2009 Document Revised: 09/15/2015 Document Reviewed: 05/14/2014 Elsevier Interactive Patient Education  2018 [place] Maintenance, Male A healthy lifestyle and preventive care is important for your health and wellness. Ask your health care provider about what schedule of regular examinations is right for you. What should I know  about weight and diet? Eat a Healthy Diet  Eat plenty of vegetables, fruits, whole grains, low-fat dairy products, and lean protein.  Do not eat a lot of foods high in solid fats, added sugars, or salt.  Maintain a Healthy Weight Regular exercise can help you achieve or maintain a healthy weight. You should:  Do at least 150 minutes of exercise each week. The exercise should increase your heart rate and make you sweat (moderate-intensity exercise).  Do strength-training exercises at least twice a week.  Watch Your Levels of Cholesterol and Blood Lipids  Have your blood tested for lipids and cholesterol every 5 years starting at 70 years of age. If you are at high risk for heart disease, you should start having your blood tested when you are 70 years old. You may need to have your cholesterol levels checked more often if: ? Your lipid or cholesterol levels are high. ? You are older than 70 years of age. ? You are at high risk for heart disease.  What should I know about cancer screening? Many types of cancers can be detected early and may often be prevented. Lung Cancer  You should be screened every year for lung cancer if: ? You are a current smoker who has smoked for at least 30 years. ? You are a former smoker who has quit within the past 15 years.  Talk to your health care provider about your screening options, when you should start screening, and how often you should be screened.  Colorectal Cancer  Routine colorectal cancer screening usually begins at 70 years of age and should be repeated every 5-10 years until you are 70 years old. You may need to be screened more often if early forms of precancerous polyps or small growths are found. Your health care provider may recommend screening at an earlier age if you have risk factors for colon cancer.  Your health care provider may recommend using home test kits to check for hidden blood in the stool.  A small camera at the end of  a tube can  be used to examine your colon (sigmoidoscopy or colonoscopy). This checks for the earliest forms of colorectal cancer.  Prostate and Testicular Cancer  Depending on your age and overall health, your health care provider may do certain tests to screen for prostate and testicular cancer.  Talk to your health care provider about any symptoms or concerns you have about testicular or prostate cancer.  Skin Cancer  Check your skin from head to toe regularly.  Tell your health care provider about any new moles or changes in moles, especially if: ? There is a change in a mole's size, shape, or color. ? You have a mole that is larger than a pencil eraser.  Always use sunscreen. Apply sunscreen liberally and repeat throughout the day.  Protect yourself by wearing long sleeves, pants, a wide-brimmed hat, and sunglasses when outside.  What should I know about heart disease, diabetes, and high blood pressure?  If you are 98-87 years of age, have your blood pressure checked every 3-5 years. If you are 18 years of age or older, have your blood pressure checked every year. You should have your blood pressure measured twice-once when you are at a hospital or clinic, and once when you are not at a hospital or clinic. Record the average of the two measurements. To check your blood pressure when you are not at a hospital or clinic, you can use: ? An automated blood pressure machine at a pharmacy. ? A home blood pressure monitor.  Talk to your health care provider about your target blood pressure.  If you are between 24-7 years old, ask your health care provider if you should take aspirin to prevent heart disease.  Have regular diabetes screenings by checking your fasting blood sugar level. ? If you are at a normal weight and have a low risk for diabetes, have this test once every three years after the age of 31. ? If you are overweight and have a high risk for diabetes, consider being tested  at a younger age or more often.  A one-time screening for abdominal aortic aneurysm (AAA) by ultrasound is recommended for men aged 16-75 years who are current or former smokers. What should I know about preventing infection? Hepatitis B If you have a higher risk for hepatitis B, you should be screened for this virus. Talk with your health care provider to find out if you are at risk for hepatitis B infection. Hepatitis C Blood testing is recommended for:  Everyone born from [birth year] through 1964.  Anyone with known risk factors for hepatitis C.  Sexually Transmitted Diseases (STDs)  You should be screened each year for STDs including gonorrhea and chlamydia if: ? You are sexually active and are younger than 70 years of age. ? You are older than 70 years of age and your health care provider tells you that you are at risk for this type of infection. ? Your sexual activity has changed since you were last screened and you are at an increased risk for chlamydia or gonorrhea. Ask your health care provider if you are at risk.  Talk with your health care provider about whether you are at high risk of being infected with HIV. Your health care provider may recommend a prescription medicine to help prevent HIV infection.  What else can I do?  Schedule regular health, dental, and eye exams.  Stay current with your vaccines (immunizations).  Do not use any tobacco products, such as cigarettes, chewing tobacco, and e-cigarettes. If  you need help quitting, ask your health care provider.  Limit alcohol intake to no more than 2 drinks per day. One drink equals 12 ounces of beer, 5 ounces of wine, or 1 ounces of hard liquor.  Do not use street drugs.  Do not share needles.  Ask your health care provider for help if you need support or information about quitting drugs.  Tell your health care provider if you often feel depressed.  Tell your health care provider if you have ever been abused or do  not feel safe at home. This information is not intended to replace advice given to you by your health care provider. Make sure you discuss any questions you have with your health care provider. Document Released: 10/06/2007 Document Revised: 12/07/2015 Document Reviewed: 01/11/2015 Elsevier Interactive Patient Education  2018 [employer].   Hearing Loss Hearing loss is a partial or total loss of the ability to hear. This can be temporary or permanent, and it can happen in one or both ears. Hearing loss may be referred to as deafness. Medical care is necessary to treat hearing loss properly and to prevent the condition from getting worse. Your hearing may partially or completely come back, depending on what caused your hearing loss and how severe it is. In some cases, hearing loss is permanent. What are the causes? Common causes of hearing loss include:  Too much wax in the ear canal.  Infection of the ear canal or middle ear.  Fluid in the middle ear.  Injury to the ear or surrounding area.  An object stuck in the ear.  Prolonged exposure to loud sounds, such as music.  Less common causes of hearing loss include:  Tumors in the ear.  Viral or bacterial infections, such as meningitis.  A hole in the eardrum (perforated eardrum).  Problems with the hearing nerve that sends signals between the brain and the ear.  Certain medicines.  What are the signs or symptoms? Symptoms of this condition may include:  Difficulty telling the difference between sounds.  Difficulty following a conversation when there is background noise.  Lack of response to sounds in your environment. This may be most noticeable when you do not respond to startling sounds.  Needing to turn up the volume on the television, radio, etc.  Ringing in the ears.  Dizziness.  Pain in the ears.  How is this diagnosed? This condition is diagnosed based on a physical exam and a hearing test (audiometry).  The audiometry test will be performed by a hearing specialist (audiologist). You may also be referred to an ear, nose, and throat (ENT) specialist (otolaryngologist). How is this treated? Treatment for recent onset of hearing loss may include:  Ear wax removal.  Being prescribed medicines to prevent infection (antibiotics).  Being prescribed medicines to reduce inflammation (corticosteroids).  Follow these instructions at home:  If you were prescribed an antibiotic medicine, take it as told by your health care provider. Do not stop taking the antibiotic even if you start to feel better.  Take over-the-counter and prescription medicines only as told by your health care provider.  Avoid loud noises.  Return to your normal activities as told by your health care provider. Ask your health care provider what activities are safe for you.  Keep all follow-up visits as told by your health care provider. This is important. Contact a health care provider if:  You feel dizzy.  You develop new symptoms.  You vomit or feel nauseous.  You have a fever. Get help right away if:  You develop sudden changes in your vision.  You have severe ear pain.  You have new or increased weakness.  You have a severe headache. This information is not intended to replace advice given to you by your health care provider. Make sure you discuss any questions you have with your health care provider. Document Released: 04/09/2005 Document Revised: 09/15/2015 Document Reviewed: 08/25/2014 Elsevier Interactive Patient Education  2018 [employer]. *

## 2016-12-09 NOTE — Progress Notes (Signed)
I have reviewed documentation from this visit and I agree with recommendations given.  [name] G. Jordan, MD  [facility]. [place] office.   

## 2016-12-11 DIAGNOSIS — L97422 Non-pressure chronic ulcer of left heel and midfoot with fat layer exposed: Secondary | ICD-10-CM | POA: Diagnosis not present

## 2016-12-11 DIAGNOSIS — L89610 Pressure ulcer of right heel, unstageable: Secondary | ICD-10-CM | POA: Diagnosis not present

## 2016-12-11 DIAGNOSIS — E11621 Type 2 diabetes mellitus with foot ulcer: Secondary | ICD-10-CM | POA: Diagnosis not present

## 2016-12-11 DIAGNOSIS — I10 Essential (primary) hypertension: Secondary | ICD-10-CM | POA: Diagnosis not present

## 2016-12-11 DIAGNOSIS — G4730 Sleep apnea, unspecified: Secondary | ICD-10-CM | POA: Diagnosis not present

## 2016-12-11 DIAGNOSIS — E1140 Type 2 diabetes mellitus with diabetic neuropathy, unspecified: Secondary | ICD-10-CM | POA: Diagnosis not present

## 2016-12-11 DIAGNOSIS — L89623 Pressure ulcer of left heel, stage 3: Secondary | ICD-10-CM | POA: Diagnosis not present

## 2016-12-11 DIAGNOSIS — I252 Old myocardial infarction: Secondary | ICD-10-CM | POA: Diagnosis not present

## 2016-12-11 DIAGNOSIS — I2510 Atherosclerotic heart disease of native coronary artery without angina pectoris: Secondary | ICD-10-CM | POA: Diagnosis not present

## 2016-12-13 DIAGNOSIS — E1122 Type 2 diabetes mellitus with diabetic chronic kidney disease: Secondary | ICD-10-CM | POA: Diagnosis not present

## 2016-12-13 DIAGNOSIS — I5042 Chronic combined systolic (congestive) and diastolic (congestive) heart failure: Secondary | ICD-10-CM | POA: Diagnosis not present

## 2016-12-13 DIAGNOSIS — I130 Hypertensive heart and chronic kidney disease with heart failure and stage 1 through stage 4 chronic kidney disease, or unspecified chronic kidney disease: Secondary | ICD-10-CM | POA: Diagnosis not present

## 2016-12-13 DIAGNOSIS — L89322 Pressure ulcer of left buttock, stage 2: Secondary | ICD-10-CM | POA: Diagnosis not present

## 2016-12-13 DIAGNOSIS — N183 Chronic kidney disease, stage 3 (moderate): Secondary | ICD-10-CM | POA: Diagnosis not present

## 2016-12-13 DIAGNOSIS — I70203 Unspecified atherosclerosis of native arteries of extremities, bilateral legs: Secondary | ICD-10-CM | POA: Diagnosis not present

## 2016-12-13 DIAGNOSIS — L89312 Pressure ulcer of right buttock, stage 2: Secondary | ICD-10-CM | POA: Diagnosis not present

## 2016-12-13 DIAGNOSIS — I482 Chronic atrial fibrillation: Secondary | ICD-10-CM | POA: Diagnosis not present

## 2016-12-13 DIAGNOSIS — I2510 Atherosclerotic heart disease of native coronary artery without angina pectoris: Secondary | ICD-10-CM | POA: Diagnosis not present

## 2016-12-18 ENCOUNTER — Other Ambulatory Visit (HOSPITAL_COMMUNITY): Payer: Self-pay | Admitting: Cardiology

## 2016-12-18 DIAGNOSIS — L89610 Pressure ulcer of right heel, unstageable: Secondary | ICD-10-CM | POA: Diagnosis not present

## 2016-12-18 DIAGNOSIS — E1140 Type 2 diabetes mellitus with diabetic neuropathy, unspecified: Secondary | ICD-10-CM | POA: Diagnosis not present

## 2016-12-18 DIAGNOSIS — I130 Hypertensive heart and chronic kidney disease with heart failure and stage 1 through stage 4 chronic kidney disease, or unspecified chronic kidney disease: Secondary | ICD-10-CM

## 2016-12-18 DIAGNOSIS — L89623 Pressure ulcer of left heel, stage 3: Secondary | ICD-10-CM | POA: Diagnosis not present

## 2016-12-18 DIAGNOSIS — I2510 Atherosclerotic heart disease of native coronary artery without angina pectoris: Secondary | ICD-10-CM | POA: Diagnosis not present

## 2016-12-18 DIAGNOSIS — I252 Old myocardial infarction: Secondary | ICD-10-CM | POA: Diagnosis not present

## 2016-12-18 DIAGNOSIS — E11621 Type 2 diabetes mellitus with foot ulcer: Secondary | ICD-10-CM | POA: Diagnosis not present

## 2016-12-18 DIAGNOSIS — L97422 Non-pressure chronic ulcer of left heel and midfoot with fat layer exposed: Secondary | ICD-10-CM | POA: Diagnosis not present

## 2016-12-18 DIAGNOSIS — I10 Essential (primary) hypertension: Secondary | ICD-10-CM | POA: Diagnosis not present

## 2016-12-18 DIAGNOSIS — N312 Flaccid neuropathic bladder, not elsewhere classified: Secondary | ICD-10-CM | POA: Diagnosis not present

## 2016-12-18 DIAGNOSIS — G4730 Sleep apnea, unspecified: Secondary | ICD-10-CM | POA: Diagnosis not present

## 2016-12-18 MED ORDER — LOSARTAN POTASSIUM 25 MG PO TABS: ORAL_TABLET | ORAL | 2 refills | Status: DC

## 2016-12-20 ENCOUNTER — Other Ambulatory Visit (HOSPITAL_COMMUNITY): Payer: Self-pay | Admitting: Cardiology

## 2016-12-20 DIAGNOSIS — I130 Hypertensive heart and chronic kidney disease with heart failure and stage 1 through stage 4 chronic kidney disease, or unspecified chronic kidney disease: Secondary | ICD-10-CM

## 2016-12-20 MED ORDER — LOSARTAN POTASSIUM 25 MG PO TABS: ORAL_TABLET | ORAL | 2 refills | Status: DC

## 2016-12-21 DIAGNOSIS — N183 Chronic kidney disease, stage 3 (moderate): Secondary | ICD-10-CM | POA: Diagnosis not present

## 2016-12-21 DIAGNOSIS — L89322 Pressure ulcer of left buttock, stage 2: Secondary | ICD-10-CM | POA: Diagnosis not present

## 2016-12-21 DIAGNOSIS — L89312 Pressure ulcer of right buttock, stage 2: Secondary | ICD-10-CM | POA: Diagnosis not present

## 2016-12-21 DIAGNOSIS — I482 Chronic atrial fibrillation: Secondary | ICD-10-CM | POA: Diagnosis not present

## 2016-12-21 DIAGNOSIS — E1122 Type 2 diabetes mellitus with diabetic chronic kidney disease: Secondary | ICD-10-CM | POA: Diagnosis not present

## 2016-12-21 DIAGNOSIS — I5042 Chronic combined systolic (congestive) and diastolic (congestive) heart failure: Secondary | ICD-10-CM | POA: Diagnosis not present

## 2016-12-21 DIAGNOSIS — I70203 Unspecified atherosclerosis of native arteries of extremities, bilateral legs: Secondary | ICD-10-CM | POA: Diagnosis not present

## 2016-12-21 DIAGNOSIS — I2510 Atherosclerotic heart disease of native coronary artery without angina pectoris: Secondary | ICD-10-CM | POA: Diagnosis not present

## 2016-12-21 DIAGNOSIS — I130 Hypertensive heart and chronic kidney disease with heart failure and stage 1 through stage 4 chronic kidney disease, or unspecified chronic kidney disease: Secondary | ICD-10-CM | POA: Diagnosis not present

## 2016-12-25 ENCOUNTER — Encounter (HOSPITAL_BASED_OUTPATIENT_CLINIC_OR_DEPARTMENT_OTHER): Payer: Medicare HMO | Attending: Surgery

## 2016-12-25 DIAGNOSIS — L97519 Non-pressure chronic ulcer of other part of right foot with unspecified severity: Secondary | ICD-10-CM | POA: Diagnosis not present

## 2016-12-25 DIAGNOSIS — L89610 Pressure ulcer of right heel, unstageable: Secondary | ICD-10-CM | POA: Diagnosis not present

## 2016-12-25 DIAGNOSIS — I5031 Acute diastolic (congestive) heart failure: Secondary | ICD-10-CM | POA: Diagnosis not present

## 2016-12-25 DIAGNOSIS — E11621 Type 2 diabetes mellitus with foot ulcer: Secondary | ICD-10-CM | POA: Insufficient documentation

## 2016-12-25 DIAGNOSIS — L97422 Non-pressure chronic ulcer of left heel and midfoot with fat layer exposed: Secondary | ICD-10-CM | POA: Diagnosis not present

## 2016-12-25 DIAGNOSIS — L97529 Non-pressure chronic ulcer of other part of left foot with unspecified severity: Secondary | ICD-10-CM | POA: Diagnosis not present

## 2016-12-25 DIAGNOSIS — I25119 Atherosclerotic heart disease of native coronary artery with unspecified angina pectoris: Secondary | ICD-10-CM | POA: Insufficient documentation

## 2017-01-01 DIAGNOSIS — L89612 Pressure ulcer of right heel, stage 2: Secondary | ICD-10-CM | POA: Diagnosis not present

## 2017-01-01 DIAGNOSIS — E11621 Type 2 diabetes mellitus with foot ulcer: Secondary | ICD-10-CM | POA: Diagnosis not present

## 2017-01-01 DIAGNOSIS — L97529 Non-pressure chronic ulcer of other part of left foot with unspecified severity: Secondary | ICD-10-CM | POA: Diagnosis not present

## 2017-01-01 DIAGNOSIS — L97422 Non-pressure chronic ulcer of left heel and midfoot with fat layer exposed: Secondary | ICD-10-CM | POA: Diagnosis not present

## 2017-01-01 DIAGNOSIS — I5031 Acute diastolic (congestive) heart failure: Secondary | ICD-10-CM | POA: Diagnosis not present

## 2017-01-01 DIAGNOSIS — I25119 Atherosclerotic heart disease of native coronary artery with unspecified angina pectoris: Secondary | ICD-10-CM | POA: Diagnosis not present

## 2017-01-01 DIAGNOSIS — L97519 Non-pressure chronic ulcer of other part of right foot with unspecified severity: Secondary | ICD-10-CM | POA: Diagnosis not present

## 2017-01-03 DIAGNOSIS — E1122 Type 2 diabetes mellitus with diabetic chronic kidney disease: Secondary | ICD-10-CM | POA: Diagnosis not present

## 2017-01-03 DIAGNOSIS — I2510 Atherosclerotic heart disease of native coronary artery without angina pectoris: Secondary | ICD-10-CM | POA: Diagnosis not present

## 2017-01-03 DIAGNOSIS — Z4789 Encounter for other orthopedic aftercare: Secondary | ICD-10-CM | POA: Diagnosis not present

## 2017-01-03 DIAGNOSIS — R278 Other lack of coordination: Secondary | ICD-10-CM | POA: Diagnosis not present

## 2017-01-03 DIAGNOSIS — I5022 Chronic systolic (congestive) heart failure: Secondary | ICD-10-CM | POA: Diagnosis not present

## 2017-01-03 DIAGNOSIS — M6281 Muscle weakness (generalized): Secondary | ICD-10-CM | POA: Diagnosis not present

## 2017-01-03 DIAGNOSIS — R262 Difficulty in walking, not elsewhere classified: Secondary | ICD-10-CM | POA: Diagnosis not present

## 2017-01-08 DIAGNOSIS — L97422 Non-pressure chronic ulcer of left heel and midfoot with fat layer exposed: Secondary | ICD-10-CM | POA: Diagnosis not present

## 2017-01-08 DIAGNOSIS — L97519 Non-pressure chronic ulcer of other part of right foot with unspecified severity: Secondary | ICD-10-CM | POA: Diagnosis not present

## 2017-01-08 DIAGNOSIS — I25119 Atherosclerotic heart disease of native coronary artery with unspecified angina pectoris: Secondary | ICD-10-CM | POA: Diagnosis not present

## 2017-01-08 DIAGNOSIS — I5031 Acute diastolic (congestive) heart failure: Secondary | ICD-10-CM | POA: Diagnosis not present

## 2017-01-08 DIAGNOSIS — E11621 Type 2 diabetes mellitus with foot ulcer: Secondary | ICD-10-CM | POA: Diagnosis not present

## 2017-01-08 DIAGNOSIS — L89613 Pressure ulcer of right heel, stage 3: Secondary | ICD-10-CM | POA: Diagnosis not present

## 2017-01-08 DIAGNOSIS — L97529 Non-pressure chronic ulcer of other part of left foot with unspecified severity: Secondary | ICD-10-CM | POA: Diagnosis not present

## 2017-01-15 DIAGNOSIS — E11621 Type 2 diabetes mellitus with foot ulcer: Secondary | ICD-10-CM | POA: Diagnosis not present

## 2017-01-15 DIAGNOSIS — L97519 Non-pressure chronic ulcer of other part of right foot with unspecified severity: Secondary | ICD-10-CM | POA: Diagnosis not present

## 2017-01-15 DIAGNOSIS — I5031 Acute diastolic (congestive) heart failure: Secondary | ICD-10-CM | POA: Diagnosis not present

## 2017-01-15 DIAGNOSIS — L97422 Non-pressure chronic ulcer of left heel and midfoot with fat layer exposed: Secondary | ICD-10-CM | POA: Diagnosis not present

## 2017-01-15 DIAGNOSIS — L97529 Non-pressure chronic ulcer of other part of left foot with unspecified severity: Secondary | ICD-10-CM | POA: Diagnosis not present

## 2017-01-15 DIAGNOSIS — I25119 Atherosclerotic heart disease of native coronary artery with unspecified angina pectoris: Secondary | ICD-10-CM | POA: Diagnosis not present

## 2017-01-15 DIAGNOSIS — L89610 Pressure ulcer of right heel, unstageable: Secondary | ICD-10-CM | POA: Diagnosis not present

## 2017-01-15 DIAGNOSIS — N312 Flaccid neuropathic bladder, not elsewhere classified: Secondary | ICD-10-CM | POA: Diagnosis not present

## 2017-01-17 ENCOUNTER — Encounter: Payer: Self-pay | Admitting: Internal Medicine

## 2017-01-17 ENCOUNTER — Ambulatory Visit (HOSPITAL_COMMUNITY)
Admission: RE | Admit: 2017-01-17 | Discharge: 2017-01-17 | Disposition: A | Discharge status: Home / Self Care | Payer: Medicare HMO | Source: Ambulatory Visit | Attending: Family Medicine | Admitting: Family Medicine

## 2017-01-17 ENCOUNTER — Encounter (HOSPITAL_COMMUNITY): Payer: Self-pay | Admitting: Internal Medicine

## 2017-01-17 ENCOUNTER — Ambulatory Visit (HOSPITAL_BASED_OUTPATIENT_CLINIC_OR_DEPARTMENT_OTHER)
Admission: RE | Admit: 2017-01-17 | Discharge: 2017-01-17 | Disposition: A | Discharge status: Home / Self Care | Payer: Medicare HMO | Source: Ambulatory Visit | Attending: Internal Medicine | Admitting: Internal Medicine

## 2017-01-17 VITALS — BP 118/82 | HR 71

## 2017-01-17 DIAGNOSIS — Z794 Long term (current) use of insulin: Secondary | ICD-10-CM | POA: Diagnosis not present

## 2017-01-17 DIAGNOSIS — Z993 Dependence on wheelchair: Secondary | ICD-10-CM | POA: Insufficient documentation

## 2017-01-17 DIAGNOSIS — I4901 Ventricular fibrillation: Secondary | ICD-10-CM

## 2017-01-17 DIAGNOSIS — I5042 Chronic combined systolic (congestive) and diastolic (congestive) heart failure: Secondary | ICD-10-CM | POA: Insufficient documentation

## 2017-01-17 DIAGNOSIS — Z7902 Long term (current) use of antithrombotics/antiplatelets: Secondary | ICD-10-CM | POA: Diagnosis not present

## 2017-01-17 DIAGNOSIS — M109 Gout, unspecified: Secondary | ICD-10-CM | POA: Diagnosis not present

## 2017-01-17 DIAGNOSIS — N320 Bladder-neck obstruction: Secondary | ICD-10-CM | POA: Diagnosis not present

## 2017-01-17 DIAGNOSIS — I214 Non-ST elevation (NSTEMI) myocardial infarction: Secondary | ICD-10-CM | POA: Diagnosis not present

## 2017-01-17 DIAGNOSIS — I130 Hypertensive heart and chronic kidney disease with heart failure and stage 1 through stage 4 chronic kidney disease, or unspecified chronic kidney disease: Secondary | ICD-10-CM

## 2017-01-17 DIAGNOSIS — Z7982 Long term (current) use of aspirin: Secondary | ICD-10-CM | POA: Insufficient documentation

## 2017-01-17 DIAGNOSIS — Z95810 Presence of automatic (implantable) cardiac defibrillator: Secondary | ICD-10-CM | POA: Insufficient documentation

## 2017-01-17 DIAGNOSIS — I2510 Atherosclerotic heart disease of native coronary artery without angina pectoris: Secondary | ICD-10-CM | POA: Diagnosis not present

## 2017-01-17 DIAGNOSIS — I252 Old myocardial infarction: Secondary | ICD-10-CM | POA: Diagnosis not present

## 2017-01-17 DIAGNOSIS — I5022 Chronic systolic (congestive) heart failure: Secondary | ICD-10-CM

## 2017-01-17 DIAGNOSIS — N319 Neuromuscular dysfunction of bladder, unspecified: Secondary | ICD-10-CM | POA: Insufficient documentation

## 2017-01-17 DIAGNOSIS — I255 Ischemic cardiomyopathy: Secondary | ICD-10-CM | POA: Insufficient documentation

## 2017-01-17 DIAGNOSIS — N184 Chronic kidney disease, stage 4 (severe): Secondary | ICD-10-CM | POA: Diagnosis not present

## 2017-01-17 DIAGNOSIS — G4730 Sleep apnea, unspecified: Secondary | ICD-10-CM | POA: Diagnosis not present

## 2017-01-17 DIAGNOSIS — Z8673 Personal history of transient ischemic attack (TIA), and cerebral infarction without residual deficits: Secondary | ICD-10-CM | POA: Insufficient documentation

## 2017-01-17 DIAGNOSIS — Z955 Presence of coronary angioplasty implant and graft: Secondary | ICD-10-CM | POA: Insufficient documentation

## 2017-01-17 DIAGNOSIS — E1122 Type 2 diabetes mellitus with diabetic chronic kidney disease: Secondary | ICD-10-CM | POA: Insufficient documentation

## 2017-01-17 DIAGNOSIS — I472 Ventricular tachycardia: Secondary | ICD-10-CM | POA: Diagnosis not present

## 2017-01-17 DIAGNOSIS — G8220 Paraplegia, unspecified: Secondary | ICD-10-CM | POA: Insufficient documentation

## 2017-01-17 DIAGNOSIS — G9520 Unspecified cord compression: Secondary | ICD-10-CM | POA: Insufficient documentation

## 2017-01-17 MED ORDER — CARVEDILOL 3.125 MG PO TABS: 3.1250 mg | ORAL_TABLET | Freq: Two times a day (BID) | ORAL | 2 refills | Status: DC

## 2017-01-17 MED ORDER — PERFLUTREN LIPID MICROSPHERE
1.0000 mL | INTRAVENOUS | Status: AC | PRN
  Administered 2017-01-17: 2 mL via INTRAVENOUS
  Filled 2017-01-17: qty 10
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
## 2017-01-17 NOTE — Patient Instructions (Signed)
Increase Carvedilol 3.125 mg (1 tab), twice day   Your physician recommends that you schedule a follow-up appointment in: 6 months

## 2017-01-22 ENCOUNTER — Encounter (HOSPITAL_BASED_OUTPATIENT_CLINIC_OR_DEPARTMENT_OTHER): Payer: Medicare HMO | Attending: Surgery

## 2017-01-22 DIAGNOSIS — I110 Hypertensive heart disease with heart failure: Secondary | ICD-10-CM | POA: Insufficient documentation

## 2017-01-22 DIAGNOSIS — I25119 Atherosclerotic heart disease of native coronary artery with unspecified angina pectoris: Secondary | ICD-10-CM | POA: Insufficient documentation

## 2017-01-22 DIAGNOSIS — L97412 Non-pressure chronic ulcer of right heel and midfoot with fat layer exposed: Secondary | ICD-10-CM | POA: Diagnosis not present

## 2017-01-22 DIAGNOSIS — E11621 Type 2 diabetes mellitus with foot ulcer: Secondary | ICD-10-CM | POA: Diagnosis not present

## 2017-01-22 DIAGNOSIS — E1140 Type 2 diabetes mellitus with diabetic neuropathy, unspecified: Secondary | ICD-10-CM | POA: Diagnosis not present

## 2017-01-22 DIAGNOSIS — L97422 Non-pressure chronic ulcer of left heel and midfoot with fat layer exposed: Secondary | ICD-10-CM | POA: Diagnosis not present

## 2017-01-22 DIAGNOSIS — G4730 Sleep apnea, unspecified: Secondary | ICD-10-CM | POA: Diagnosis not present

## 2017-01-22 DIAGNOSIS — I2510 Atherosclerotic heart disease of native coronary artery without angina pectoris: Secondary | ICD-10-CM | POA: Insufficient documentation

## 2017-01-22 DIAGNOSIS — I5032 Chronic diastolic (congestive) heart failure: Secondary | ICD-10-CM | POA: Diagnosis not present

## 2017-01-22 DIAGNOSIS — L89610 Pressure ulcer of right heel, unstageable: Secondary | ICD-10-CM | POA: Diagnosis not present

## 2017-01-22 DIAGNOSIS — I252 Old myocardial infarction: Secondary | ICD-10-CM | POA: Diagnosis not present

## 2017-01-29 DIAGNOSIS — L89610 Pressure ulcer of right heel, unstageable: Secondary | ICD-10-CM | POA: Diagnosis not present

## 2017-01-29 DIAGNOSIS — L97412 Non-pressure chronic ulcer of right heel and midfoot with fat layer exposed: Secondary | ICD-10-CM | POA: Diagnosis not present

## 2017-01-29 DIAGNOSIS — I25119 Atherosclerotic heart disease of native coronary artery with unspecified angina pectoris: Secondary | ICD-10-CM | POA: Diagnosis not present

## 2017-01-29 DIAGNOSIS — I2510 Atherosclerotic heart disease of native coronary artery without angina pectoris: Secondary | ICD-10-CM | POA: Diagnosis not present

## 2017-01-29 DIAGNOSIS — E11621 Type 2 diabetes mellitus with foot ulcer: Secondary | ICD-10-CM | POA: Diagnosis not present

## 2017-01-29 DIAGNOSIS — G4730 Sleep apnea, unspecified: Secondary | ICD-10-CM | POA: Diagnosis not present

## 2017-01-29 DIAGNOSIS — I110 Hypertensive heart disease with heart failure: Secondary | ICD-10-CM | POA: Diagnosis not present

## 2017-01-29 DIAGNOSIS — L97422 Non-pressure chronic ulcer of left heel and midfoot with fat layer exposed: Secondary | ICD-10-CM | POA: Diagnosis not present

## 2017-01-29 DIAGNOSIS — E1140 Type 2 diabetes mellitus with diabetic neuropathy, unspecified: Secondary | ICD-10-CM | POA: Diagnosis not present

## 2017-01-29 DIAGNOSIS — I5032 Chronic diastolic (congestive) heart failure: Secondary | ICD-10-CM | POA: Diagnosis not present

## 2017-02-02 DIAGNOSIS — R262 Difficulty in walking, not elsewhere classified: Secondary | ICD-10-CM | POA: Diagnosis not present

## 2017-02-02 DIAGNOSIS — E1122 Type 2 diabetes mellitus with diabetic chronic kidney disease: Secondary | ICD-10-CM | POA: Diagnosis not present

## 2017-02-02 DIAGNOSIS — R278 Other lack of coordination: Secondary | ICD-10-CM | POA: Diagnosis not present

## 2017-02-02 DIAGNOSIS — Z4789 Encounter for other orthopedic aftercare: Secondary | ICD-10-CM | POA: Diagnosis not present

## 2017-02-02 DIAGNOSIS — I2510 Atherosclerotic heart disease of native coronary artery without angina pectoris: Secondary | ICD-10-CM | POA: Diagnosis not present

## 2017-02-02 DIAGNOSIS — M6281 Muscle weakness (generalized): Secondary | ICD-10-CM | POA: Diagnosis not present

## 2017-02-02 DIAGNOSIS — I5022 Chronic systolic (congestive) heart failure: Secondary | ICD-10-CM | POA: Diagnosis not present

## 2017-02-03 ENCOUNTER — Other Ambulatory Visit (HOSPITAL_COMMUNITY): Payer: Self-pay | Admitting: Internal Medicine

## 2017-02-05 DIAGNOSIS — I110 Hypertensive heart disease with heart failure: Secondary | ICD-10-CM | POA: Diagnosis not present

## 2017-02-05 DIAGNOSIS — I2510 Atherosclerotic heart disease of native coronary artery without angina pectoris: Secondary | ICD-10-CM | POA: Diagnosis not present

## 2017-02-05 DIAGNOSIS — L97422 Non-pressure chronic ulcer of left heel and midfoot with fat layer exposed: Secondary | ICD-10-CM | POA: Diagnosis not present

## 2017-02-05 DIAGNOSIS — G4730 Sleep apnea, unspecified: Secondary | ICD-10-CM | POA: Diagnosis not present

## 2017-02-05 DIAGNOSIS — L97412 Non-pressure chronic ulcer of right heel and midfoot with fat layer exposed: Secondary | ICD-10-CM | POA: Diagnosis not present

## 2017-02-05 DIAGNOSIS — E11621 Type 2 diabetes mellitus with foot ulcer: Secondary | ICD-10-CM | POA: Diagnosis not present

## 2017-02-05 DIAGNOSIS — E1140 Type 2 diabetes mellitus with diabetic neuropathy, unspecified: Secondary | ICD-10-CM | POA: Diagnosis not present

## 2017-02-05 DIAGNOSIS — I5032 Chronic diastolic (congestive) heart failure: Secondary | ICD-10-CM | POA: Diagnosis not present

## 2017-02-05 DIAGNOSIS — I25119 Atherosclerotic heart disease of native coronary artery with unspecified angina pectoris: Secondary | ICD-10-CM | POA: Diagnosis not present

## 2017-02-12 ENCOUNTER — Encounter: Payer: Medicare HMO | Admitting: *Deleted

## 2017-02-12 DIAGNOSIS — I25119 Atherosclerotic heart disease of native coronary artery with unspecified angina pectoris: Secondary | ICD-10-CM | POA: Diagnosis not present

## 2017-02-12 DIAGNOSIS — I2510 Atherosclerotic heart disease of native coronary artery without angina pectoris: Secondary | ICD-10-CM | POA: Diagnosis not present

## 2017-02-12 DIAGNOSIS — N312 Flaccid neuropathic bladder, not elsewhere classified: Secondary | ICD-10-CM | POA: Diagnosis not present

## 2017-02-12 DIAGNOSIS — L89610 Pressure ulcer of right heel, unstageable: Secondary | ICD-10-CM | POA: Diagnosis not present

## 2017-02-12 DIAGNOSIS — L97412 Non-pressure chronic ulcer of right heel and midfoot with fat layer exposed: Secondary | ICD-10-CM | POA: Diagnosis not present

## 2017-02-12 DIAGNOSIS — L97422 Non-pressure chronic ulcer of left heel and midfoot with fat layer exposed: Secondary | ICD-10-CM | POA: Diagnosis not present

## 2017-02-12 DIAGNOSIS — E1140 Type 2 diabetes mellitus with diabetic neuropathy, unspecified: Secondary | ICD-10-CM | POA: Diagnosis not present

## 2017-02-12 DIAGNOSIS — E11621 Type 2 diabetes mellitus with foot ulcer: Secondary | ICD-10-CM | POA: Diagnosis not present

## 2017-02-12 DIAGNOSIS — I5032 Chronic diastolic (congestive) heart failure: Secondary | ICD-10-CM | POA: Diagnosis not present

## 2017-02-12 DIAGNOSIS — I110 Hypertensive heart disease with heart failure: Secondary | ICD-10-CM | POA: Diagnosis not present

## 2017-02-12 DIAGNOSIS — G4730 Sleep apnea, unspecified: Secondary | ICD-10-CM | POA: Diagnosis not present

## 2017-02-14 ENCOUNTER — Encounter: Payer: Self-pay | Admitting: Cardiology

## 2017-02-19 DIAGNOSIS — L89610 Pressure ulcer of right heel, unstageable: Secondary | ICD-10-CM | POA: Diagnosis not present

## 2017-02-19 DIAGNOSIS — I2510 Atherosclerotic heart disease of native coronary artery without angina pectoris: Secondary | ICD-10-CM | POA: Diagnosis not present

## 2017-02-19 DIAGNOSIS — I110 Hypertensive heart disease with heart failure: Secondary | ICD-10-CM | POA: Diagnosis not present

## 2017-02-19 DIAGNOSIS — I5032 Chronic diastolic (congestive) heart failure: Secondary | ICD-10-CM | POA: Diagnosis not present

## 2017-02-19 DIAGNOSIS — G4730 Sleep apnea, unspecified: Secondary | ICD-10-CM | POA: Diagnosis not present

## 2017-02-19 DIAGNOSIS — I25119 Atherosclerotic heart disease of native coronary artery with unspecified angina pectoris: Secondary | ICD-10-CM | POA: Diagnosis not present

## 2017-02-19 DIAGNOSIS — E1140 Type 2 diabetes mellitus with diabetic neuropathy, unspecified: Secondary | ICD-10-CM | POA: Diagnosis not present

## 2017-02-19 DIAGNOSIS — L97412 Non-pressure chronic ulcer of right heel and midfoot with fat layer exposed: Secondary | ICD-10-CM | POA: Diagnosis not present

## 2017-02-19 DIAGNOSIS — E11621 Type 2 diabetes mellitus with foot ulcer: Secondary | ICD-10-CM | POA: Diagnosis not present

## 2017-02-19 DIAGNOSIS — L97422 Non-pressure chronic ulcer of left heel and midfoot with fat layer exposed: Secondary | ICD-10-CM | POA: Diagnosis not present

## 2017-02-25 ENCOUNTER — Other Ambulatory Visit (HOSPITAL_COMMUNITY): Payer: Self-pay | Admitting: *Deleted

## 2017-02-25 MED ORDER — TORSEMIDE 20 MG PO TABS: 20.0000 mg | ORAL_TABLET | ORAL | 3 refills | Status: DC

## 2017-02-25 MED ORDER — TORSEMIDE 20 MG PO TABS: 20.0000 mg | ORAL_TABLET | ORAL | 0 refills | Status: DC

## 2017-02-26 ENCOUNTER — Other Ambulatory Visit (HOSPITAL_COMMUNITY): Payer: Self-pay | Admitting: *Deleted

## 2017-02-26 ENCOUNTER — Encounter (HOSPITAL_BASED_OUTPATIENT_CLINIC_OR_DEPARTMENT_OTHER): Payer: Medicare HMO | Attending: Surgery

## 2017-02-26 DIAGNOSIS — I5031 Acute diastolic (congestive) heart failure: Secondary | ICD-10-CM | POA: Diagnosis not present

## 2017-02-26 DIAGNOSIS — I25119 Atherosclerotic heart disease of native coronary artery with unspecified angina pectoris: Secondary | ICD-10-CM | POA: Diagnosis not present

## 2017-02-26 DIAGNOSIS — E11621 Type 2 diabetes mellitus with foot ulcer: Secondary | ICD-10-CM | POA: Insufficient documentation

## 2017-02-26 DIAGNOSIS — G4730 Sleep apnea, unspecified: Secondary | ICD-10-CM | POA: Diagnosis not present

## 2017-02-26 DIAGNOSIS — I252 Old myocardial infarction: Secondary | ICD-10-CM | POA: Insufficient documentation

## 2017-02-26 DIAGNOSIS — L97412 Non-pressure chronic ulcer of right heel and midfoot with fat layer exposed: Secondary | ICD-10-CM | POA: Diagnosis not present

## 2017-02-26 DIAGNOSIS — L97422 Non-pressure chronic ulcer of left heel and midfoot with fat layer exposed: Secondary | ICD-10-CM | POA: Diagnosis not present

## 2017-02-26 DIAGNOSIS — E1140 Type 2 diabetes mellitus with diabetic neuropathy, unspecified: Secondary | ICD-10-CM | POA: Insufficient documentation

## 2017-02-26 DIAGNOSIS — I110 Hypertensive heart disease with heart failure: Secondary | ICD-10-CM | POA: Diagnosis not present

## 2017-02-26 DIAGNOSIS — L89610 Pressure ulcer of right heel, unstageable: Secondary | ICD-10-CM | POA: Diagnosis not present

## 2017-02-26 MED ORDER — TORSEMIDE 20 MG PO TABS: 20.0000 mg | ORAL_TABLET | ORAL | 3 refills | Status: DC

## 2017-03-02 ENCOUNTER — Other Ambulatory Visit: Payer: Self-pay | Admitting: Family Medicine

## 2017-03-05 DIAGNOSIS — L89610 Pressure ulcer of right heel, unstageable: Secondary | ICD-10-CM | POA: Diagnosis not present

## 2017-03-05 DIAGNOSIS — E11621 Type 2 diabetes mellitus with foot ulcer: Secondary | ICD-10-CM | POA: Diagnosis not present

## 2017-03-05 DIAGNOSIS — L97412 Non-pressure chronic ulcer of right heel and midfoot with fat layer exposed: Secondary | ICD-10-CM | POA: Diagnosis not present

## 2017-03-05 DIAGNOSIS — I252 Old myocardial infarction: Secondary | ICD-10-CM | POA: Diagnosis not present

## 2017-03-05 DIAGNOSIS — I5031 Acute diastolic (congestive) heart failure: Secondary | ICD-10-CM | POA: Diagnosis not present

## 2017-03-05 DIAGNOSIS — L97422 Non-pressure chronic ulcer of left heel and midfoot with fat layer exposed: Secondary | ICD-10-CM | POA: Diagnosis not present

## 2017-03-05 DIAGNOSIS — I25119 Atherosclerotic heart disease of native coronary artery with unspecified angina pectoris: Secondary | ICD-10-CM | POA: Diagnosis not present

## 2017-03-05 DIAGNOSIS — G4730 Sleep apnea, unspecified: Secondary | ICD-10-CM | POA: Diagnosis not present

## 2017-03-05 DIAGNOSIS — E1140 Type 2 diabetes mellitus with diabetic neuropathy, unspecified: Secondary | ICD-10-CM | POA: Diagnosis not present

## 2017-03-05 DIAGNOSIS — I110 Hypertensive heart disease with heart failure: Secondary | ICD-10-CM | POA: Diagnosis not present

## 2017-03-07 ENCOUNTER — Other Ambulatory Visit (HOSPITAL_COMMUNITY): Payer: Self-pay | Admitting: Internal Medicine

## 2017-03-11 IMAGING — DX DG FOOT COMPLETE 3+V*L*
3 series · 3 of 3 positions shown · non-contrast
Comparison: None.

CLINICAL DATA: Soft tissue wound

EXAM:
LEFT FOOT - COMPLETE 3+ VIEW

[foot ap]
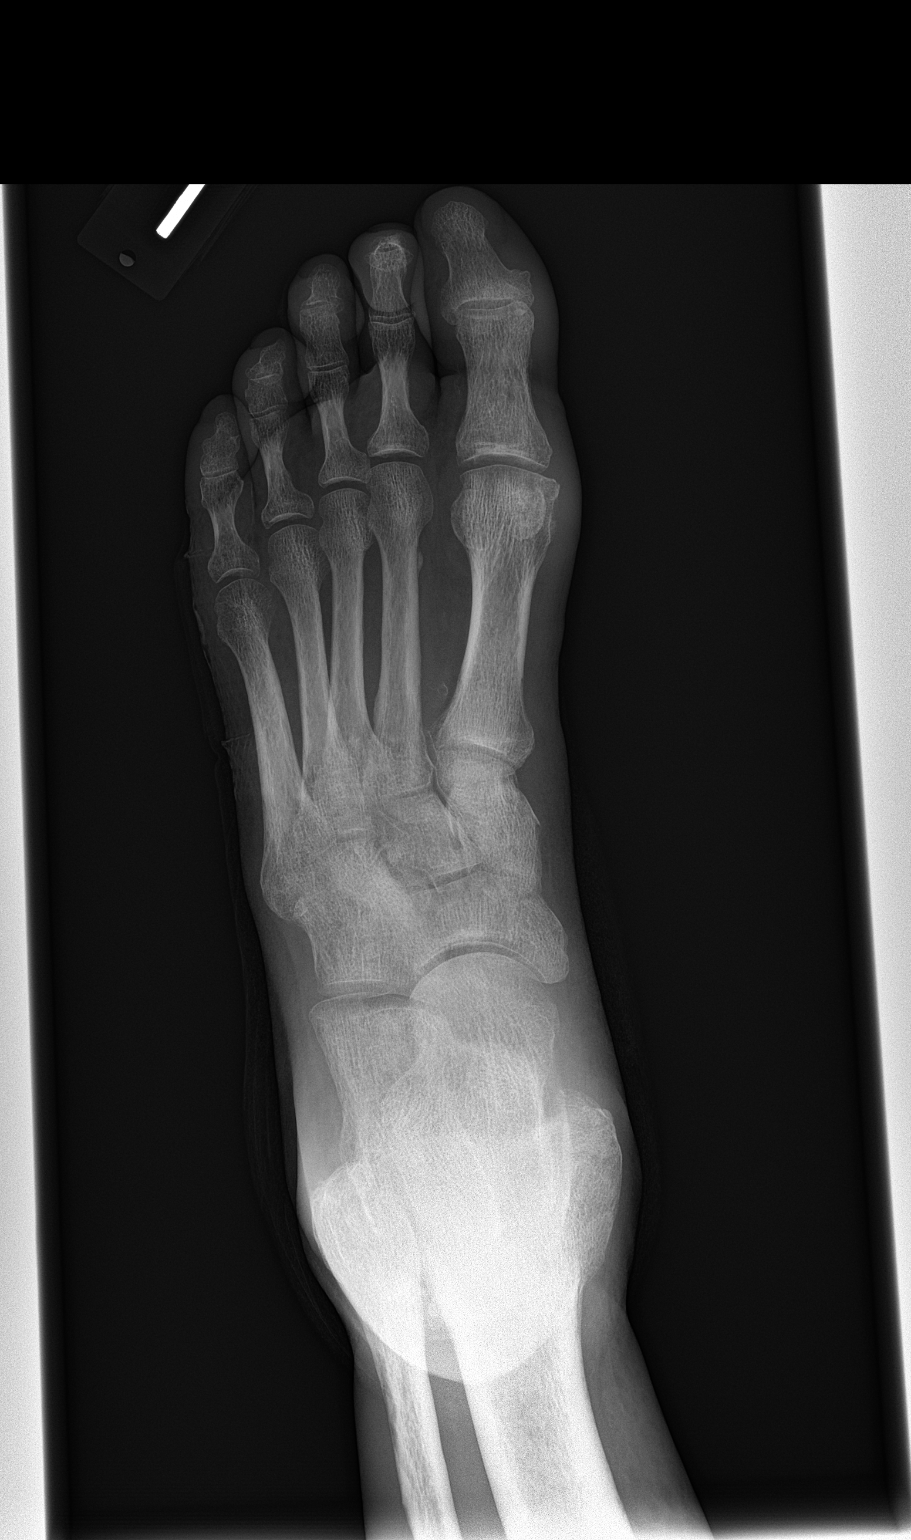

[foot obl]
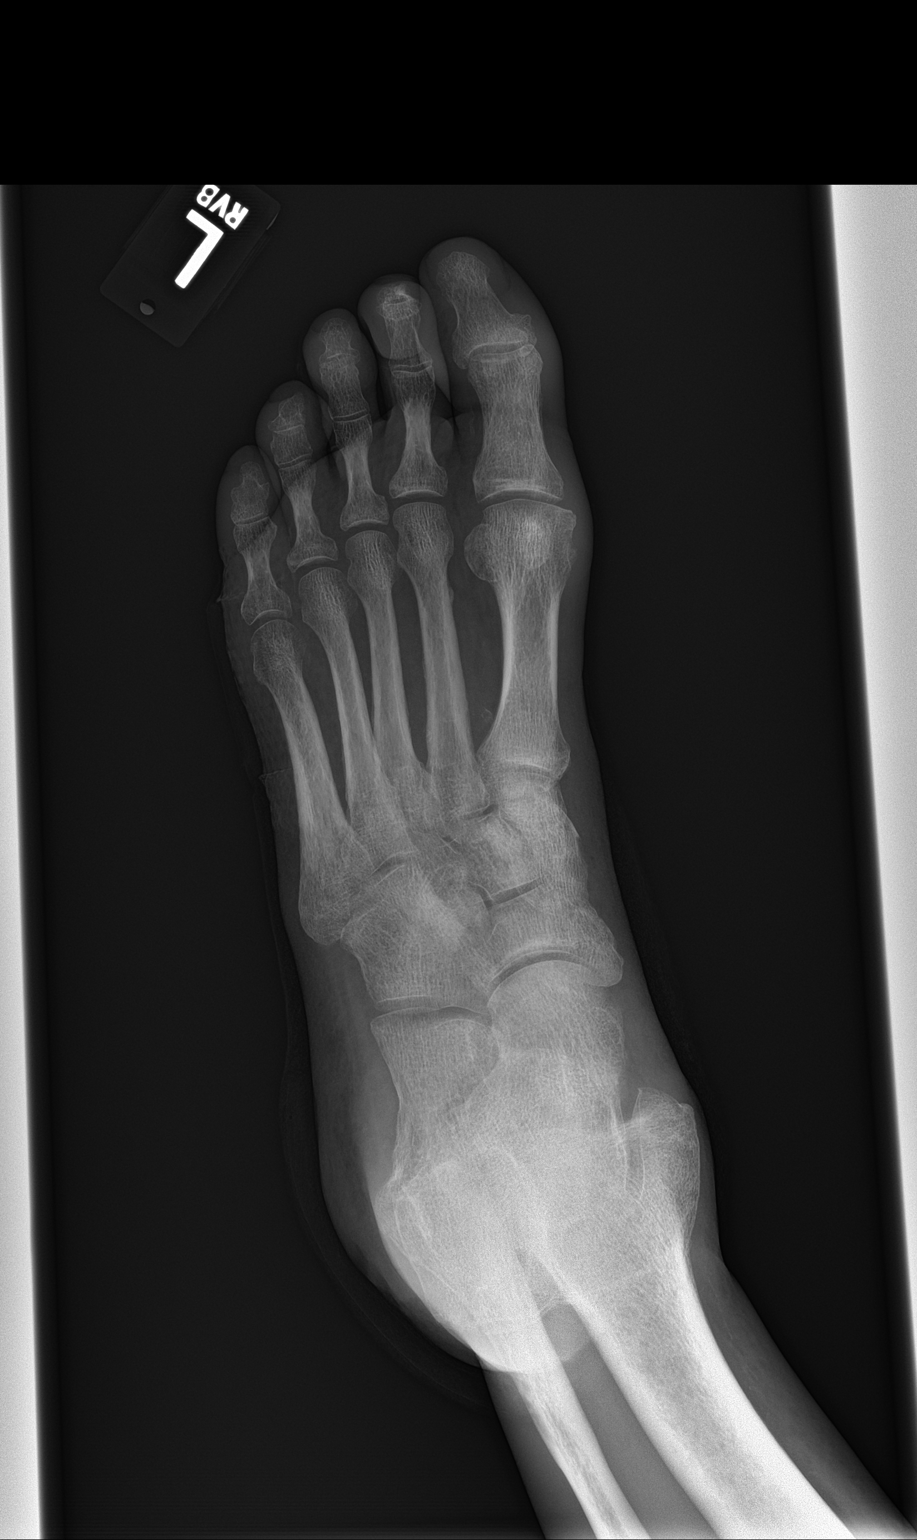

[foot lat]
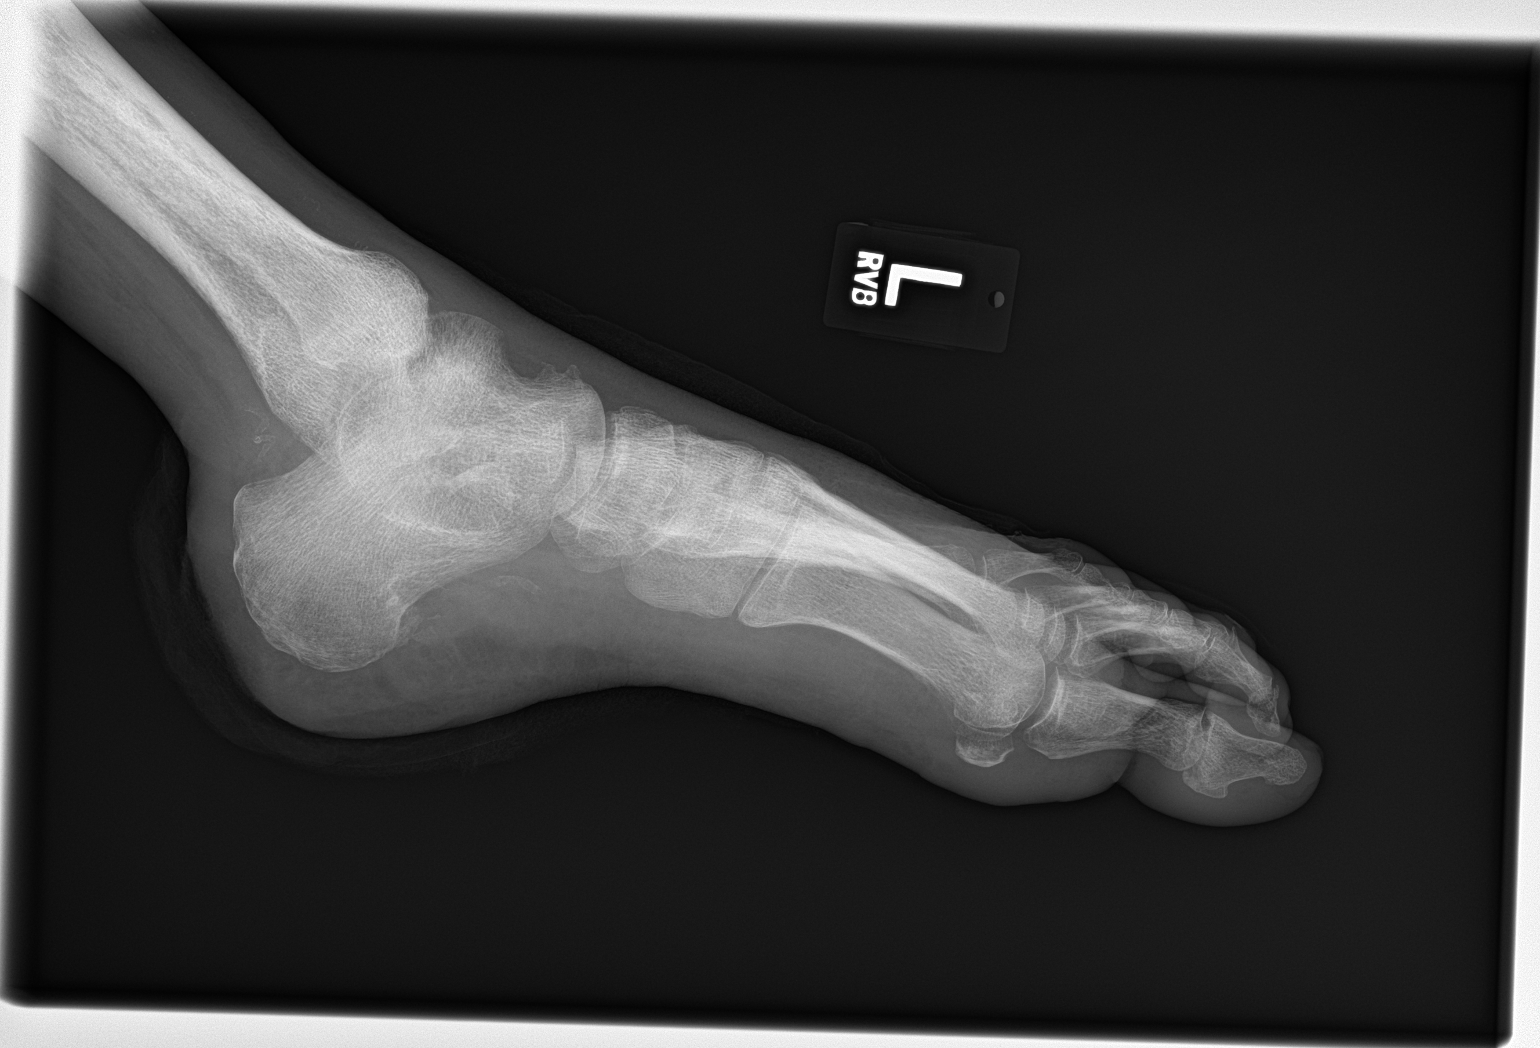

[3 of 3 positions shown; findings below may reference images not displayed]

FINDINGS: Frontal, oblique, and lateral views were obtained. There are
bandages laterally. There is no acute fracture or dislocation. There
is osteoarthritic change in the first MTP joint. There is no erosive
change or bony destruction. There are multiple foci of arterial
vascular calcification. There is spurring along the distal dorsal
talus.
IMPRESSION: Bandages laterally. No bony destruction or erosion. Narrowing first
MTP joint. No acute fracture or dislocation. Multiple vascular
calcifications consistent with diabetes mellitus.

## 2017-03-12 DIAGNOSIS — I5031 Acute diastolic (congestive) heart failure: Secondary | ICD-10-CM | POA: Diagnosis not present

## 2017-03-12 DIAGNOSIS — L97422 Non-pressure chronic ulcer of left heel and midfoot with fat layer exposed: Secondary | ICD-10-CM | POA: Diagnosis not present

## 2017-03-12 DIAGNOSIS — L97412 Non-pressure chronic ulcer of right heel and midfoot with fat layer exposed: Secondary | ICD-10-CM | POA: Diagnosis not present

## 2017-03-12 DIAGNOSIS — E11621 Type 2 diabetes mellitus with foot ulcer: Secondary | ICD-10-CM | POA: Diagnosis not present

## 2017-03-12 DIAGNOSIS — N312 Flaccid neuropathic bladder, not elsewhere classified: Secondary | ICD-10-CM | POA: Diagnosis not present

## 2017-03-12 DIAGNOSIS — G4730 Sleep apnea, unspecified: Secondary | ICD-10-CM | POA: Diagnosis not present

## 2017-03-12 DIAGNOSIS — E1140 Type 2 diabetes mellitus with diabetic neuropathy, unspecified: Secondary | ICD-10-CM | POA: Diagnosis not present

## 2017-03-12 DIAGNOSIS — I110 Hypertensive heart disease with heart failure: Secondary | ICD-10-CM | POA: Diagnosis not present

## 2017-03-12 DIAGNOSIS — I252 Old myocardial infarction: Secondary | ICD-10-CM | POA: Diagnosis not present

## 2017-03-12 DIAGNOSIS — I25119 Atherosclerotic heart disease of native coronary artery with unspecified angina pectoris: Secondary | ICD-10-CM | POA: Diagnosis not present

## 2017-03-15 ENCOUNTER — Other Ambulatory Visit: Payer: Self-pay | Admitting: Family Medicine

## 2017-03-19 DIAGNOSIS — E1140 Type 2 diabetes mellitus with diabetic neuropathy, unspecified: Secondary | ICD-10-CM | POA: Diagnosis not present

## 2017-03-19 DIAGNOSIS — I25119 Atherosclerotic heart disease of native coronary artery with unspecified angina pectoris: Secondary | ICD-10-CM | POA: Diagnosis not present

## 2017-03-19 DIAGNOSIS — I5031 Acute diastolic (congestive) heart failure: Secondary | ICD-10-CM | POA: Diagnosis not present

## 2017-03-19 DIAGNOSIS — E11621 Type 2 diabetes mellitus with foot ulcer: Secondary | ICD-10-CM | POA: Diagnosis not present

## 2017-03-19 DIAGNOSIS — G4730 Sleep apnea, unspecified: Secondary | ICD-10-CM | POA: Diagnosis not present

## 2017-03-19 DIAGNOSIS — L97422 Non-pressure chronic ulcer of left heel and midfoot with fat layer exposed: Secondary | ICD-10-CM | POA: Diagnosis not present

## 2017-03-19 DIAGNOSIS — I110 Hypertensive heart disease with heart failure: Secondary | ICD-10-CM | POA: Diagnosis not present

## 2017-03-19 DIAGNOSIS — L97412 Non-pressure chronic ulcer of right heel and midfoot with fat layer exposed: Secondary | ICD-10-CM | POA: Diagnosis not present

## 2017-03-19 DIAGNOSIS — I252 Old myocardial infarction: Secondary | ICD-10-CM | POA: Diagnosis not present

## 2017-03-26 ENCOUNTER — Encounter (HOSPITAL_BASED_OUTPATIENT_CLINIC_OR_DEPARTMENT_OTHER): Payer: Medicare HMO | Attending: Surgery

## 2017-03-26 DIAGNOSIS — I252 Old myocardial infarction: Secondary | ICD-10-CM | POA: Diagnosis not present

## 2017-03-26 DIAGNOSIS — I25119 Atherosclerotic heart disease of native coronary artery with unspecified angina pectoris: Secondary | ICD-10-CM | POA: Diagnosis not present

## 2017-03-26 DIAGNOSIS — G4730 Sleep apnea, unspecified: Secondary | ICD-10-CM | POA: Diagnosis not present

## 2017-03-26 DIAGNOSIS — I110 Hypertensive heart disease with heart failure: Secondary | ICD-10-CM | POA: Diagnosis not present

## 2017-03-26 DIAGNOSIS — E1140 Type 2 diabetes mellitus with diabetic neuropathy, unspecified: Secondary | ICD-10-CM | POA: Insufficient documentation

## 2017-03-26 DIAGNOSIS — L89613 Pressure ulcer of right heel, stage 3: Secondary | ICD-10-CM | POA: Insufficient documentation

## 2017-03-26 DIAGNOSIS — L89623 Pressure ulcer of left heel, stage 3: Secondary | ICD-10-CM | POA: Insufficient documentation

## 2017-03-26 DIAGNOSIS — I509 Heart failure, unspecified: Secondary | ICD-10-CM | POA: Diagnosis not present

## 2017-03-26 DIAGNOSIS — L97412 Non-pressure chronic ulcer of right heel and midfoot with fat layer exposed: Secondary | ICD-10-CM | POA: Diagnosis not present

## 2017-03-26 DIAGNOSIS — I2510 Atherosclerotic heart disease of native coronary artery without angina pectoris: Secondary | ICD-10-CM | POA: Diagnosis not present

## 2017-03-26 DIAGNOSIS — L97422 Non-pressure chronic ulcer of left heel and midfoot with fat layer exposed: Secondary | ICD-10-CM | POA: Diagnosis not present

## 2017-03-27 ENCOUNTER — Other Ambulatory Visit: Payer: Self-pay | Admitting: Family Medicine

## 2017-03-28 ENCOUNTER — Other Ambulatory Visit: Payer: Self-pay | Admitting: Family Medicine

## 2017-04-02 ENCOUNTER — Ambulatory Visit: Payer: Medicare HMO | Admitting: Family Medicine

## 2017-04-09 ENCOUNTER — Telehealth: Payer: Self-pay | Admitting: Neurology

## 2017-04-09 ENCOUNTER — Encounter: Payer: Self-pay | Admitting: Family Medicine

## 2017-04-09 ENCOUNTER — Ambulatory Visit (INDEPENDENT_AMBULATORY_CARE_PROVIDER_SITE_OTHER): Payer: Medicare HMO | Admitting: Family Medicine

## 2017-04-09 VITALS — BP 110/70 | HR 68 | Temp 98.4°F | Resp 16 | Ht 74.0 in

## 2017-04-09 DIAGNOSIS — N312 Flaccid neuropathic bladder, not elsewhere classified: Secondary | ICD-10-CM | POA: Diagnosis not present

## 2017-04-09 DIAGNOSIS — E785 Hyperlipidemia, unspecified: Secondary | ICD-10-CM

## 2017-04-09 DIAGNOSIS — L304 Erythema intertrigo: Secondary | ICD-10-CM

## 2017-04-09 DIAGNOSIS — G4730 Sleep apnea, unspecified: Secondary | ICD-10-CM | POA: Diagnosis not present

## 2017-04-09 DIAGNOSIS — I2510 Atherosclerotic heart disease of native coronary artery without angina pectoris: Secondary | ICD-10-CM | POA: Diagnosis not present

## 2017-04-09 DIAGNOSIS — I110 Hypertensive heart disease with heart failure: Secondary | ICD-10-CM | POA: Diagnosis not present

## 2017-04-09 DIAGNOSIS — I25119 Atherosclerotic heart disease of native coronary artery with unspecified angina pectoris: Secondary | ICD-10-CM | POA: Diagnosis not present

## 2017-04-09 DIAGNOSIS — L89152 Pressure ulcer of sacral region, stage 2: Secondary | ICD-10-CM

## 2017-04-09 DIAGNOSIS — L97412 Non-pressure chronic ulcer of right heel and midfoot with fat layer exposed: Secondary | ICD-10-CM | POA: Diagnosis not present

## 2017-04-09 DIAGNOSIS — L89623 Pressure ulcer of left heel, stage 3: Secondary | ICD-10-CM | POA: Diagnosis not present

## 2017-04-09 DIAGNOSIS — R748 Abnormal levels of other serum enzymes: Secondary | ICD-10-CM | POA: Diagnosis not present

## 2017-04-09 DIAGNOSIS — E1149 Type 2 diabetes mellitus with other diabetic neurological complication: Secondary | ICD-10-CM

## 2017-04-09 DIAGNOSIS — L97422 Non-pressure chronic ulcer of left heel and midfoot with fat layer exposed: Secondary | ICD-10-CM | POA: Diagnosis not present

## 2017-04-09 DIAGNOSIS — E11621 Type 2 diabetes mellitus with foot ulcer: Secondary | ICD-10-CM | POA: Diagnosis not present

## 2017-04-09 DIAGNOSIS — E1165 Type 2 diabetes mellitus with hyperglycemia: Secondary | ICD-10-CM

## 2017-04-09 DIAGNOSIS — E1140 Type 2 diabetes mellitus with diabetic neuropathy, unspecified: Secondary | ICD-10-CM | POA: Diagnosis not present

## 2017-04-09 DIAGNOSIS — IMO0002 Reserved for concepts with insufficient information to code with codable children: Secondary | ICD-10-CM

## 2017-04-09 DIAGNOSIS — Z23 Encounter for immunization: Secondary | ICD-10-CM

## 2017-04-09 DIAGNOSIS — I252 Old myocardial infarction: Secondary | ICD-10-CM | POA: Diagnosis not present

## 2017-04-09 DIAGNOSIS — L89613 Pressure ulcer of right heel, stage 3: Secondary | ICD-10-CM | POA: Diagnosis not present

## 2017-04-09 DIAGNOSIS — I509 Heart failure, unspecified: Secondary | ICD-10-CM | POA: Diagnosis not present

## 2017-04-09 LAB — CBC
HEMATOCRIT: 42 % (ref 39.0–52.0)
Hemoglobin: 13.6 g/dL (ref 13.0–17.0)
MCHC: 32.3 g/dL (ref 30.0–36.0)
MCV: 89.6 fl (ref 78.0–100.0)
Platelets: 159 10*3/uL (ref 150.0–400.0)
RBC: 4.69 Mil/uL (ref 4.22–5.81)
RDW: 14.8 % (ref 11.5–15.5)
WBC: 7.9 10*3/uL (ref 4.0–10.5)

## 2017-04-09 LAB — BASIC METABOLIC PANEL
BUN: 31 mg/dL — ABNORMAL HIGH (ref 6–23)
CHLORIDE: 109 meq/L (ref 96–112)
CO2: 28 mEq/L (ref 19–32)
Calcium: 8.7 mg/dL (ref 8.4–10.5)
Creatinine, Ser: 1.71 mg/dL — ABNORMAL HIGH (ref 0.40–1.50)
GFR: 42.18 mL/min — AB (ref >60.00)
Glucose, Bld: 245 mg/dL — ABNORMAL HIGH (ref 70–99)
POTASSIUM: 4.5 meq/L (ref 3.5–5.1)
SODIUM: 143 meq/L (ref 135–145)

## 2017-04-09 LAB — HEPATIC FUNCTION PANEL
ALK PHOS: 129 U/L — AB (ref 39–117)
ALT: 42 U/L (ref 0–53)
AST: 34 U/L (ref 0–37)
Albumin: 3.6 g/dL (ref 3.5–5.2)
BILIRUBIN DIRECT: 0.1 mg/dL (ref 0.0–0.3)
BILIRUBIN TOTAL: 0.5 mg/dL (ref 0.2–1.2)
Total Protein: 6.8 g/dL (ref 6.0–8.3)

## 2017-04-09 LAB — HEMOGLOBIN A1C: Hgb A1c MFr Bld: 8.7 % — ABNORMAL HIGH (ref 4.6–6.5)

## 2017-04-09 MED ORDER — NYSTATIN 100000 UNIT/GM EX POWD: Freq: Three times a day (TID) | CUTANEOUS | 2 refills | Status: AC

## 2017-04-09 MED ORDER — CLOTRIMAZOLE-BETAMETHASONE 1-0.05 % EX CREA: 1.0000 "application " | TOPICAL_CREAM | Freq: Every day | CUTANEOUS | 1 refills | Status: AC | PRN

## 2017-04-09 NOTE — Telephone Encounter (Signed)
Please advise 

## 2017-04-09 NOTE — Patient Instructions (Addendum)
A few things to remember from today's visit:   Type II diabetes mellitus with neurological manifestations, uncontrolled ([place]) - Plan: Microalbumin / creatinine urine ratio, Hemoglobin A1c, Fructosamine, CBC, Basic metabolic panel  Intertrigo - Plan: clotrimazole-betamethasone (LOTRISONE) cream, nystatin (MYCOSTATIN/NYSTOP) powder  Elevated alkaline phosphatase level - Plan: Hepatic function panel   Please be sure medication list is accurate. If a new problem present, please set up appointment sooner than planned today.

## 2017-04-09 NOTE — Telephone Encounter (Signed)
Please give her information to medical records.

## 2017-04-09 NOTE — Progress Notes (Signed)
HPI:   [name] [name] is a 71 y.o. male, who is here today with his daughter to follow on some chronic medical problems.  Since his last visit he has follow-up with Dr. Georgette Shell, cardiologist. He is still following with urologist,neurogenic bladder, has urine cath.   DM 2: Currently he is on Lantus 20 units daily, dose was increased 06/26/2016.  Lab Results  Component Value Date   HGBA1C 8.6 (H) 10/30/2016   Lab Results  Component Value Date   MICROALBUR 22.1 (H) 06/26/2016   FG: low 100's. Around bedtime BS's 105-115. He has not had hypoglycemia since Novolog was discontinued.  Hypertension: Chronic.  Currently he is on Losartan 25 mg daily and Coreg 3.125 mg twice daily. BP readings at home:Not checking.  Denies headache, visual changes, chest pain, dyspnea at rest , palpitation, focal weakness, or edema. CHF and CAD, he has not noted orthopnea or PND.  CKD III, he has not noted blood in urine.   Lab Results  Component Value Date   CREATININE 1.76 (H) 10/16/2016   BUN 25 (H) 10/16/2016   NA 140 10/16/2016   K 4.6 10/16/2016   CL 108 10/16/2016   CO2 25 10/16/2016   Lab Results  Component Value Date   WBC 9.4 10/30/2016   HGB 12.6 (L) 10/30/2016   HCT 38.8 (L) 10/30/2016   MCV 83.8 10/30/2016   PLT 210.0 10/30/2016    He is not longer following with neurologist, Dr Posey Pronto, for peripheral neuropathy.  HLD: He is on Crestor 40 mg daily.  In general he follows low fat diet.  Lab Results  Component Value Date   CHOL 120 03/09/2014   HDL 39 (L) 03/09/2014   LDLCALC 56 03/09/2014   LDLDIRECT 100.5 02/08/2012   TRIG 123 03/09/2014   CHOLHDL 3.1 03/09/2014  Alk Phosphatase has been elevated in the past. He has not noted abdominal pain,nausea,vomiting,or jaundice.  Still following with wound clinic for sacral and ankles wound, has an appt today. According to daughter, lesions are smaller, progressively getting better. He is not ambulatory due  to unstable gait and CHF/exertional dyspnea, has a wheel chair.  Intertrigo: Greatly improved. He is applying topical Lotrisone as needed. Exacerbated by heat. No tenderness or drainage.   Review of Systems  Constitutional: Positive for fatigue (No more than usual). Negative for activity change, appetite change and fever.  HENT: Negative for mouth sores, nosebleeds, sore throat and trouble swallowing.   Eyes: Negative for redness and visual disturbance.  Respiratory: Negative for cough, shortness of breath and wheezing.   Cardiovascular: Negative for chest pain, palpitations and leg swelling.  Gastrointestinal: Negative for abdominal pain, nausea and vomiting.  Genitourinary: Negative for decreased urine volume and hematuria.  Musculoskeletal: Positive for gait problem. Negative for myalgias.  Skin: Positive for rash and wound.  Neurological: Positive for numbness. Negative for syncope, weakness and headaches.  Psychiatric/Behavioral: Positive for sleep disturbance (On Remeron). Negative for confusion. The patient is not nervous/anxious.       Current Outpatient Medications on File Prior to Visit  Medication Sig Dispense Refill  . amiodarone (PACERONE) 200 MG tablet TAKE 1 TABLET(200 MG) BY MOUTH DAILY 90 tablet 3  . Ascorbic Acid (VITAMIN C PO) Take 1 tablet by mouth 2 (two) times daily.    Marland Kitchen aspirin EC 81 MG tablet Take 81 mg by mouth daily.    . carvedilol (COREG) 3.125 MG tablet Take 1 tablet (3.125 mg total) by mouth 2 (  two) times daily with a meal. 45 tablet 2  . clopidogrel (PLAVIX) 75 MG tablet TAKE 1 TABLET(75 MG) BY MOUTH DAILY 30 tablet 11  . finasteride (PROSCAR) 5 MG tablet Take 5 mg by mouth daily.     Marland Kitchen HYDROcodone-acetaminophen (NORCO/VICODIN) 5-325 MG tablet Take 1 tablet by mouth 2 (two) times daily as needed for moderate pain. 50 tablet 0  . insulin glargine (LANTUS) 100 UNIT/ML injection Inject 0.15 mLs (15 Units total) into the skin daily with supper. 10 mL   .  LANTUS SOLOSTAR 100 UNIT/ML Solostar Pen ADMINISTER 20 UNITS UNDER THE SKIN AT BEDTIME 15 mL 0  . losartan (COZAAR) 25 MG tablet TAKE 1/2 TABLET(12.5 MG) BY MOUTH DAILY 45 tablet 2  . MAGNESIUM PO Take 1 tablet by mouth every other day.    . mirtazapine (REMERON) 15 MG tablet TAKE 1 TABLET(15 MG) BY MOUTH AT BEDTIME 90 tablet 1  . multivitamin-iron-minerals-folic acid (THERAPEUTIC-M) TABS tablet Take 1 tablet by mouth daily.    . Probiotic Product (PROBIOTIC PO) Take 1 capsule by mouth daily.    . rosuvastatin (CRESTOR) 40 MG tablet TAKE 1 TABLET BY MOUTH DAILY 90 tablet 0  . tamsulosin (FLOMAX) 0.4 MG CAPS capsule TAKE ONE CAPSULE BY MOUTH DAILY 90 capsule 2  . torsemide (DEMADEX) 20 MG tablet Take 1 tablet (20 mg total) every other day by mouth. Start on Monday 03/12/2016 30 tablet 3  . vitamin A 10000 UNIT capsule Take 10,000 Units by mouth daily.    Marland Kitchen zinc sulfate 220 (50 Zn) MG capsule Take 220 mg by mouth daily.     No current facility-administered medications on file prior to visit.      Past Medical History:  Diagnosis Date  . Automatic implantable cardioverter-defibrillator in situ   . CAD (coronary artery disease)    a. LHC (08/2005):  Ostial LAD 99%, mid LAD 50%, superior D1 80%, inferior D1 75%, mid OM proximal 80%, proximal PDA 25-30%, mid RCA 99%.   MRI with full thickness scar.  Med Rx.  2015 demonstrated similar diffuse three-vessel coronary disease. Perfusion imaging with rest we distribution demonstrated no viability in the area of the LAD and circumflex. Medical management.  . CAD, multiple vessel, RCA 100% mid occl.; LAD 99% stenosisprox.  mid of 50-60%; LCX OM-1 90%, OM-2 99%,AV groove 80%  12/10/2013  . Chronic combined systolic and diastolic heart failure ([place])   . CKD (chronic kidney disease)   . Gout   . Hx of echocardiogram 2015   Echo (08/2013):  EF 10-15%, diff HK, mod LAE, severe RVE, mod reduced RVSF, mild RAE, PASP 41 mmHg.  Marland Kitchen Hypertension   . Ischemic  cardiomyopathy    Echo (8/11):  EF 40-45%;  Echo (5/15):  EF 10-15%  . Myocardial infarction [facility]) 2007   out of hosp MI - LHC with 3v CAD rx medically  . NSVT (nonsustained ventricular tachycardia) (H[facility])    s/p ICD  . Peripheral neuropathy   . Sleep apnea    "has CPAP; won't use it"  . Stroke [facility]) ~ 2013   "right leg weak since" (12/09/2013)  . Type II diabetes mellitus ([name]) dx'd 2005   Allergies  Allergen Reactions  . Sulfa Antibiotics Rash    Social History   Socioeconomic History  . Marital status: Married    Spouse name: None  . Number of children: None  . Years of education: None  . Highest education level: None  Social Needs  .  Financial resource strain: None  . Food insecurity - worry: None  . Food insecurity - inability: None  . Transportation needs - medical: None  . Transportation needs - non-medical: None  Occupational History  . Occupation: retired  Tobacco Use  . Smoking status: Never Smoker  . Smokeless tobacco: Never Used  Substance and Sexual Activity  . Alcohol use: No  . Drug use: No  . Sexual activity: None  Other Topics Concern  . 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   He lives with wife.   Highest level of education:  High school    Vitals:   04/09/17 1157  BP: 110/70  Pulse: 68  Resp: 16  Temp: 98.4 F (36.9 C)  SpO2: 99%   Body mass index is 23.11 kg/m.   Physical Exam  Nursing note and vitals reviewed. Constitutional: He is oriented to person, place, and time. He appears well-developed and well-nourished. No distress.  HENT:  Head: Normocephalic and atraumatic.  Mouth/Throat: Oropharynx is clear and moist and mucous membranes are normal.  Eyes: Conjunctivae are normal. Pupils are equal, round, and reactive to light.  Cardiovascular: Normal rate and regular rhythm.  Murmur (II/VI SEM LUSB and RUSB)  heard. Respiratory: Effort normal and breath sounds normal. No respiratory distress.  GI: Soft. He exhibits no mass. There is no tenderness.  Musculoskeletal: He exhibits edema (Trace pitting LE edema, bilateral). He exhibits no tenderness.  Lymphadenopathy:    He has no cervical adenopathy.  Neurological: He is alert and oriented to person, place, and time.  No focal deficit appreciated. In a wheel chair  Skin: Skin is warm. No rash noted. No erythema.  Post inflammatory skin changes on groin and lower abdominal area. No erythema or drainage.   Psychiatric: He has a normal mood and affect. Cognition and memory are normal.  Fairly groomed,good eye contact.    ASSESSMENT AND PLAN:   Mr. Rushi was seen today for follow-up.  Diagnoses and all orders for this visit:  Lab Results  Component Value Date   HGBA1C 8.7 (H) 04/09/2017   Lab Results  Component Value Date   CREATININE 1.71 (H) 04/09/2017   BUN 31 (H) 04/09/2017   NA 143 04/09/2017   K 4.5 04/09/2017   CL 109 04/09/2017   CO2 28 04/09/2017   Lab Results  Component Value Date   WBC 7.9 04/09/2017   HGB 13.6 04/09/2017   HCT 42.0 04/09/2017   MCV 89.6 04/09/2017   PLT 159.0 04/09/2017   Lab Results  Component Value Date   MICROALBUR 9.3 (H) 04/09/2017   Lab Results  Component Value Date   ALT 42 04/09/2017   AST 34 04/09/2017   ALKPHOS 129 (H) 04/09/2017   BILITOT 0.5 04/09/2017   Type II diabetes mellitus with neurological manifestations, uncontrolled ([place])  HgA1C has not been at goal. No changes in current management, will follow HgA1C and make recommendations accordingly. Healthy diet with avoidance of added sugar food intake is an important part of treatment and recommended. Annual eye exam, periodic dental and foot care recommended. F/U in 5-6 months  -     Microalbumin / creatinine urine ratio -     Hemoglobin A1c -     Fructosamine -     CBC -     Basic metabolic  panel  Intertrigo  Improved. Educated about  Dx and prognosis. Lotrisone to continue as needed. Nystatin powder added.  -     clotrimazole-betamethasone (LOTRISONE) cream; Apply 1 application topically daily as needed. mall amount. -     nystatin (MYCOSTATIN/NYSTOP) powder; Apply topically 3 (three) times daily.  Elevated alkaline phosphatase level  Further recommendations will be given according to labs/imaging results.  -     Hepatic function panel  Hyperlipidemia, unspecified hyperlipidemia type  He is not fasting today. No changes in Crestor. Continue low fat diet. Planning on FLP next visit.  Need for influenza vaccination -     Flu vaccine HIGH DOSE PF  Sacral decubitus ulcer, stage II  Continue following with wound clinic.    -Mr. KLINT LEZCANO was advised to return sooner than planned today if new concerns arise.        G. [name], MD  [facility]. [place] office.

## 2017-04-09 NOTE — Telephone Encounter (Signed)
Patient's daughter called and needs to get her dad's CT Results so they can give them to their [place]. Thanks

## 2017-04-10 LAB — MICROALBUMIN / CREATININE URINE RATIO
CREATININE, U: 41.7 mg/dL
MICROALB UR: 9.3 mg/dL — AB (ref 0.0–1.9)
MICROALB/CREAT RATIO: 22.2 mg/g (ref 0.0–30.0)

## 2017-04-10 NOTE — Telephone Encounter (Signed)
Left message giving [place] records phone #.

## 2017-04-11 LAB — FRUCTOSAMINE: FRUCTOSAMINE: 363 umol/L — AB (ref 190–270)

## 2017-04-22 DIAGNOSIS — E11621 Type 2 diabetes mellitus with foot ulcer: Secondary | ICD-10-CM | POA: Diagnosis not present

## 2017-04-22 DIAGNOSIS — I110 Hypertensive heart disease with heart failure: Secondary | ICD-10-CM | POA: Diagnosis not present

## 2017-04-22 DIAGNOSIS — I2510 Atherosclerotic heart disease of native coronary artery without angina pectoris: Secondary | ICD-10-CM | POA: Diagnosis not present

## 2017-04-22 DIAGNOSIS — L89613 Pressure ulcer of right heel, stage 3: Secondary | ICD-10-CM | POA: Diagnosis not present

## 2017-04-22 DIAGNOSIS — I252 Old myocardial infarction: Secondary | ICD-10-CM | POA: Diagnosis not present

## 2017-04-22 DIAGNOSIS — L89623 Pressure ulcer of left heel, stage 3: Secondary | ICD-10-CM | POA: Diagnosis not present

## 2017-04-22 DIAGNOSIS — I509 Heart failure, unspecified: Secondary | ICD-10-CM | POA: Diagnosis not present

## 2017-04-22 DIAGNOSIS — I25119 Atherosclerotic heart disease of native coronary artery with unspecified angina pectoris: Secondary | ICD-10-CM | POA: Diagnosis not present

## 2017-04-22 DIAGNOSIS — L97422 Non-pressure chronic ulcer of left heel and midfoot with fat layer exposed: Secondary | ICD-10-CM | POA: Diagnosis not present

## 2017-04-22 DIAGNOSIS — E1140 Type 2 diabetes mellitus with diabetic neuropathy, unspecified: Secondary | ICD-10-CM | POA: Diagnosis not present

## 2017-04-22 DIAGNOSIS — G4730 Sleep apnea, unspecified: Secondary | ICD-10-CM | POA: Diagnosis not present

## 2017-04-24 ENCOUNTER — Telehealth: Payer: Self-pay | Admitting: Neurology

## 2017-04-24 NOTE — Telephone Encounter (Signed)
Pt's daughter called and said she needs a copy of the cat scan for her father CB# [phone]

## 2017-04-25 ENCOUNTER — Telehealth: Payer: Self-pay | Admitting: Neurology

## 2017-04-25 NOTE — Telephone Encounter (Signed)
Patient states that he was in a nursing home and fell and now he cant walk and he wants to know he is not able to walk now and would like a phone call back

## 2017-04-29 ENCOUNTER — Other Ambulatory Visit: Payer: Self-pay | Admitting: Family Medicine

## 2017-04-29 NOTE — Telephone Encounter (Signed)
Patient wants to talk to someone about why he can not walk after a fall in the nursing home. He would like a phone call back

## 2017-04-29 NOTE — Telephone Encounter (Signed)
Left message that patient's daughter has already picked up paperwork.

## 2017-04-30 ENCOUNTER — Encounter (HOSPITAL_BASED_OUTPATIENT_CLINIC_OR_DEPARTMENT_OTHER): Payer: Medicare HMO | Attending: Internal Medicine

## 2017-04-30 DIAGNOSIS — I252 Old myocardial infarction: Secondary | ICD-10-CM | POA: Diagnosis not present

## 2017-04-30 DIAGNOSIS — I10 Essential (primary) hypertension: Secondary | ICD-10-CM | POA: Insufficient documentation

## 2017-04-30 DIAGNOSIS — E1142 Type 2 diabetes mellitus with diabetic polyneuropathy: Secondary | ICD-10-CM | POA: Insufficient documentation

## 2017-04-30 DIAGNOSIS — L97422 Non-pressure chronic ulcer of left heel and midfoot with fat layer exposed: Secondary | ICD-10-CM | POA: Insufficient documentation

## 2017-04-30 DIAGNOSIS — L89613 Pressure ulcer of right heel, stage 3: Secondary | ICD-10-CM | POA: Insufficient documentation

## 2017-04-30 DIAGNOSIS — B9562 Methicillin resistant Staphylococcus aureus infection as the cause of diseases classified elsewhere: Secondary | ICD-10-CM | POA: Insufficient documentation

## 2017-04-30 DIAGNOSIS — G4730 Sleep apnea, unspecified: Secondary | ICD-10-CM | POA: Diagnosis not present

## 2017-04-30 DIAGNOSIS — I2510 Atherosclerotic heart disease of native coronary artery without angina pectoris: Secondary | ICD-10-CM | POA: Diagnosis not present

## 2017-04-30 DIAGNOSIS — E11621 Type 2 diabetes mellitus with foot ulcer: Secondary | ICD-10-CM | POA: Diagnosis not present

## 2017-04-30 DIAGNOSIS — L89623 Pressure ulcer of left heel, stage 3: Secondary | ICD-10-CM | POA: Diagnosis not present

## 2017-05-02 ENCOUNTER — Other Ambulatory Visit: Payer: Self-pay | Admitting: Family Medicine

## 2017-05-02 ENCOUNTER — Other Ambulatory Visit (HOSPITAL_COMMUNITY): Payer: Self-pay | Admitting: *Deleted

## 2017-05-02 MED ORDER — AMIODARONE HCL 200 MG PO TABS: ORAL_TABLET | ORAL | 3 refills | Status: DC

## 2017-05-06 ENCOUNTER — Telehealth: Payer: Self-pay | Admitting: Neurology

## 2017-05-06 NOTE — Telephone Encounter (Signed)
Patient wants to check the status of the referral we were to do for him please call

## 2017-05-06 NOTE — Telephone Encounter (Signed)
No, I'm not sure what this is in reference to - he was last seen by me in November 2017.  The last telephone notes shows that pt requested records - does pt need number to medical records?  Best to call pt and see what they need as I have not made any referrals.   Donika K. Posey Pronto, DO

## 2017-05-06 NOTE — Telephone Encounter (Signed)
Do you know what referral this is?  I can't find anything in his chart mentioning referrals.

## 2017-05-07 DIAGNOSIS — E11621 Type 2 diabetes mellitus with foot ulcer: Secondary | ICD-10-CM | POA: Diagnosis not present

## 2017-05-07 DIAGNOSIS — I252 Old myocardial infarction: Secondary | ICD-10-CM | POA: Diagnosis not present

## 2017-05-07 DIAGNOSIS — G4730 Sleep apnea, unspecified: Secondary | ICD-10-CM | POA: Diagnosis not present

## 2017-05-07 DIAGNOSIS — N312 Flaccid neuropathic bladder, not elsewhere classified: Secondary | ICD-10-CM | POA: Diagnosis not present

## 2017-05-07 DIAGNOSIS — I2510 Atherosclerotic heart disease of native coronary artery without angina pectoris: Secondary | ICD-10-CM | POA: Diagnosis not present

## 2017-05-07 DIAGNOSIS — L97422 Non-pressure chronic ulcer of left heel and midfoot with fat layer exposed: Secondary | ICD-10-CM | POA: Diagnosis not present

## 2017-05-07 DIAGNOSIS — E1142 Type 2 diabetes mellitus with diabetic polyneuropathy: Secondary | ICD-10-CM | POA: Diagnosis not present

## 2017-05-07 DIAGNOSIS — L89613 Pressure ulcer of right heel, stage 3: Secondary | ICD-10-CM | POA: Diagnosis not present

## 2017-05-07 DIAGNOSIS — I10 Essential (primary) hypertension: Secondary | ICD-10-CM | POA: Diagnosis not present

## 2017-05-07 DIAGNOSIS — B9562 Methicillin resistant Staphylococcus aureus infection as the cause of diseases classified elsewhere: Secondary | ICD-10-CM | POA: Diagnosis not present

## 2017-05-08 NOTE — Telephone Encounter (Signed)
I spoke to patient and informed him that he may need to be seen since we have not seen him since 2017.  He will have his daughter call her to schedule the appointment.

## 2017-05-08 NOTE — Telephone Encounter (Signed)
Pt called back and wants to know what to do, he has a Chief Executive Officer and is wanting to know what Dr Posey Pronto thinks he should do regarding his leg, I suggested he make an appointment  But he wants a call back

## 2017-05-14 ENCOUNTER — Other Ambulatory Visit (HOSPITAL_COMMUNITY)
Admission: RE | Admit: 2017-05-14 | Discharge: 2017-05-14 | Disposition: A | Discharge status: Home / Self Care | Payer: Medicare HMO | Source: Other Acute Inpatient Hospital | Attending: Internal Medicine | Admitting: Internal Medicine

## 2017-05-14 DIAGNOSIS — L089 Local infection of the skin and subcutaneous tissue, unspecified: Secondary | ICD-10-CM | POA: Diagnosis not present

## 2017-05-14 DIAGNOSIS — E1142 Type 2 diabetes mellitus with diabetic polyneuropathy: Secondary | ICD-10-CM | POA: Diagnosis not present

## 2017-05-14 DIAGNOSIS — I252 Old myocardial infarction: Secondary | ICD-10-CM | POA: Diagnosis not present

## 2017-05-14 DIAGNOSIS — E11621 Type 2 diabetes mellitus with foot ulcer: Secondary | ICD-10-CM | POA: Insufficient documentation

## 2017-05-14 DIAGNOSIS — L89613 Pressure ulcer of right heel, stage 3: Secondary | ICD-10-CM | POA: Insufficient documentation

## 2017-05-14 DIAGNOSIS — G4730 Sleep apnea, unspecified: Secondary | ICD-10-CM | POA: Diagnosis not present

## 2017-05-14 DIAGNOSIS — L97422 Non-pressure chronic ulcer of left heel and midfoot with fat layer exposed: Secondary | ICD-10-CM | POA: Diagnosis not present

## 2017-05-14 DIAGNOSIS — I2510 Atherosclerotic heart disease of native coronary artery without angina pectoris: Secondary | ICD-10-CM | POA: Diagnosis not present

## 2017-05-14 DIAGNOSIS — I10 Essential (primary) hypertension: Secondary | ICD-10-CM | POA: Diagnosis not present

## 2017-05-14 DIAGNOSIS — B9562 Methicillin resistant Staphylococcus aureus infection as the cause of diseases classified elsewhere: Secondary | ICD-10-CM | POA: Diagnosis not present

## 2017-05-14 DIAGNOSIS — E11622 Type 2 diabetes mellitus with other skin ulcer: Secondary | ICD-10-CM | POA: Diagnosis not present

## 2017-05-19 LAB — AEROBIC/ANAEROBIC CULTURE W GRAM STAIN (SURGICAL/DEEP WOUND)

## 2017-05-20 ENCOUNTER — Other Ambulatory Visit: Payer: Self-pay | Admitting: Family Medicine

## 2017-05-21 ENCOUNTER — Other Ambulatory Visit (HOSPITAL_COMMUNITY): Payer: Self-pay | Admitting: Family Medicine

## 2017-05-21 DIAGNOSIS — I10 Essential (primary) hypertension: Secondary | ICD-10-CM | POA: Diagnosis not present

## 2017-05-21 DIAGNOSIS — E1142 Type 2 diabetes mellitus with diabetic polyneuropathy: Secondary | ICD-10-CM | POA: Diagnosis not present

## 2017-05-21 DIAGNOSIS — B9562 Methicillin resistant Staphylococcus aureus infection as the cause of diseases classified elsewhere: Secondary | ICD-10-CM | POA: Diagnosis not present

## 2017-05-21 DIAGNOSIS — I2510 Atherosclerotic heart disease of native coronary artery without angina pectoris: Secondary | ICD-10-CM | POA: Diagnosis not present

## 2017-05-21 DIAGNOSIS — L97422 Non-pressure chronic ulcer of left heel and midfoot with fat layer exposed: Secondary | ICD-10-CM | POA: Diagnosis not present

## 2017-05-21 DIAGNOSIS — I252 Old myocardial infarction: Secondary | ICD-10-CM | POA: Diagnosis not present

## 2017-05-21 DIAGNOSIS — E11621 Type 2 diabetes mellitus with foot ulcer: Secondary | ICD-10-CM | POA: Diagnosis not present

## 2017-05-21 DIAGNOSIS — L89613 Pressure ulcer of right heel, stage 3: Secondary | ICD-10-CM | POA: Diagnosis not present

## 2017-05-21 DIAGNOSIS — G4730 Sleep apnea, unspecified: Secondary | ICD-10-CM | POA: Diagnosis not present

## 2017-05-21 DIAGNOSIS — L89629 Pressure ulcer of left heel, unspecified stage: Secondary | ICD-10-CM | POA: Diagnosis not present

## 2017-05-21 NOTE — Telephone Encounter (Signed)
Refill request received for Lantus 20 units at bedtime. Per last result notes, patient should be taking 25 units. Called patient and verified that he is taking 25 units at bedtime as directed and medication filled to pharmacy as requested.

## 2017-05-28 ENCOUNTER — Other Ambulatory Visit: Payer: Self-pay | Admitting: Family Medicine

## 2017-05-28 ENCOUNTER — Encounter (HOSPITAL_BASED_OUTPATIENT_CLINIC_OR_DEPARTMENT_OTHER): Payer: Medicare HMO | Attending: Internal Medicine

## 2017-05-28 DIAGNOSIS — E119 Type 2 diabetes mellitus without complications: Secondary | ICD-10-CM | POA: Diagnosis not present

## 2017-05-28 DIAGNOSIS — L89619 Pressure ulcer of right heel, unspecified stage: Secondary | ICD-10-CM | POA: Diagnosis not present

## 2017-05-28 DIAGNOSIS — I252 Old myocardial infarction: Secondary | ICD-10-CM | POA: Diagnosis not present

## 2017-05-28 DIAGNOSIS — L89623 Pressure ulcer of left heel, stage 3: Secondary | ICD-10-CM | POA: Diagnosis not present

## 2017-05-28 DIAGNOSIS — I5031 Acute diastolic (congestive) heart failure: Secondary | ICD-10-CM | POA: Diagnosis not present

## 2017-05-28 DIAGNOSIS — G4730 Sleep apnea, unspecified: Secondary | ICD-10-CM | POA: Diagnosis not present

## 2017-05-28 DIAGNOSIS — I25119 Atherosclerotic heart disease of native coronary artery with unspecified angina pectoris: Secondary | ICD-10-CM | POA: Diagnosis not present

## 2017-05-28 DIAGNOSIS — L89629 Pressure ulcer of left heel, unspecified stage: Secondary | ICD-10-CM | POA: Diagnosis not present

## 2017-05-28 DIAGNOSIS — I110 Hypertensive heart disease with heart failure: Secondary | ICD-10-CM | POA: Insufficient documentation

## 2017-05-28 DIAGNOSIS — I2510 Atherosclerotic heart disease of native coronary artery without angina pectoris: Secondary | ICD-10-CM | POA: Insufficient documentation

## 2017-05-28 DIAGNOSIS — L89613 Pressure ulcer of right heel, stage 3: Secondary | ICD-10-CM | POA: Insufficient documentation

## 2017-05-28 DIAGNOSIS — E1140 Type 2 diabetes mellitus with diabetic neuropathy, unspecified: Secondary | ICD-10-CM | POA: Diagnosis not present

## 2017-05-28 DIAGNOSIS — E11621 Type 2 diabetes mellitus with foot ulcer: Secondary | ICD-10-CM | POA: Insufficient documentation

## 2017-06-04 DIAGNOSIS — I5031 Acute diastolic (congestive) heart failure: Secondary | ICD-10-CM | POA: Diagnosis not present

## 2017-06-04 DIAGNOSIS — L89629 Pressure ulcer of left heel, unspecified stage: Secondary | ICD-10-CM | POA: Diagnosis not present

## 2017-06-04 DIAGNOSIS — L89623 Pressure ulcer of left heel, stage 3: Secondary | ICD-10-CM | POA: Diagnosis not present

## 2017-06-04 DIAGNOSIS — G4730 Sleep apnea, unspecified: Secondary | ICD-10-CM | POA: Diagnosis not present

## 2017-06-04 DIAGNOSIS — I25119 Atherosclerotic heart disease of native coronary artery with unspecified angina pectoris: Secondary | ICD-10-CM | POA: Diagnosis not present

## 2017-06-04 DIAGNOSIS — I2510 Atherosclerotic heart disease of native coronary artery without angina pectoris: Secondary | ICD-10-CM | POA: Diagnosis not present

## 2017-06-04 DIAGNOSIS — I110 Hypertensive heart disease with heart failure: Secondary | ICD-10-CM | POA: Diagnosis not present

## 2017-06-04 DIAGNOSIS — E119 Type 2 diabetes mellitus without complications: Secondary | ICD-10-CM | POA: Diagnosis not present

## 2017-06-04 DIAGNOSIS — L89613 Pressure ulcer of right heel, stage 3: Secondary | ICD-10-CM | POA: Diagnosis not present

## 2017-06-04 DIAGNOSIS — E11621 Type 2 diabetes mellitus with foot ulcer: Secondary | ICD-10-CM | POA: Diagnosis not present

## 2017-06-10 ENCOUNTER — Encounter: Payer: Self-pay | Admitting: Neurology

## 2017-06-10 ENCOUNTER — Ambulatory Visit: Payer: Medicare HMO | Admitting: Neurology

## 2017-06-10 VITALS — BP 110/64 | HR 78 | Resp 12

## 2017-06-10 DIAGNOSIS — G5721 Lesion of femoral nerve, right lower limb: Secondary | ICD-10-CM | POA: Diagnosis not present

## 2017-06-10 DIAGNOSIS — E1142 Type 2 diabetes mellitus with diabetic polyneuropathy: Secondary | ICD-10-CM | POA: Diagnosis not present

## 2017-06-10 DIAGNOSIS — M5417 Radiculopathy, lumbosacral region: Secondary | ICD-10-CM

## 2017-06-10 NOTE — Patient Instructions (Addendum)
Please allow 1-2 weeks to contact medical records [phone]) to receive my most recent note.

## 2017-06-10 NOTE — Progress Notes (Signed)
Follow-up Visit   Date: 06/10/17    [name] [name] MRN: [id number] DOB: 10-12-1945   Interim History: [name] [name] is a 72 y.o. right-handed Caucasian male with diabetes mellitus (HbA1c 12) complicated by neuropathy and renal insufficiency, stroke (2015), CAD s/p MI (2007), NSVT s/p ICD, heart failure (EF 15%) hypertension returning to the clinic for follow-up of right leg weakness.  The patient was accompanied to the clinic by daughter who also provides collateral information.    History of present illness: Starting around June 2017, he began having right arm shaking, lasting <1 minute, but occurring every 20-minutes.  The movements were involuntary and he is unable to controled them. These movements resolved within a month, while he was hospitalized, and has not returned.  In early July 2017, patient was hospitalized for acute mental status change, right arm tremor, and aspiration pneumonia.  In the ER, he went into VT and AICD shocked him three times.  He was intubated and treated with pneumonia as well as NSTEMI.  Cardiac catherization multivessel disease. There was concern of possible new stroke because of slurred speech and right sided weakness, however this resolved within a week.  CT head was performed on 7/6 when he was admitted which showed chronic changes.  He has not had subsquent imaging. He was discharged to rehab on 11/06/2015 where lives currently.  He had stroke (2015) which left him with mild dysphagia and right leg weakness, but he was walking independently.    He also complains of low back pain especially with prolonged standing or walking.  He has constant numbness of the legs and gait imbalance.  He has new left foot drop which has been present since his hospitalization.  He admits to crossing his legs often, especially now that the swelling of his legs is significantly improved.   UPDATE 02/29/2016: Since he was last here, he suffered a fall while at home and  had a right femoral neck fracture and underwent right hemiarthroplasty on 8/27 by Dr. Sherlean Foot.  He was discharged to rehab and was doing well, but while sleeping, he fell out of bedtime and since this time has been unable to walk or stand.  He has severe weakness of the right leg and inability to control his bowel and bladder.  He has been home for the past two weeks with home nursing care and PT.  Since his fall, he has been unable to walk and his right leg feels like "dead weight".  There is no increased numbness/tingling since the fall - this still involves both legs to the level of the knees.  He denies radicular pain or low back pain. He no longer has any abnormal movements of the right hand.   UPDATE 06/10/2017:  Patient is here with his daughter for follow-up visit.  He was last seen in November 2017 and underwent NCS/EMG of the right leg which showed severe right femoral neuropathy, multilevel lumbosacral radiculopathy, and bilateral sensorimotor polyneuropathy.  He was not a candidate for any surgical procedure due to his medical comorbidities and he has been treated conservatively.  He is now living at his home and his daughter as the primary caregiver.  He is nonambulatory due to the severity of his weakness. There has been no significant improvement or worsening with his leg over the past year.  He has no new complaints.   Medications:  Current Outpatient Medications on File Prior to Visit  Medication Sig Dispense Refill  . amiodarone (PACERONE)  200 MG tablet TAKE 1 TABLET(200 MG) BY MOUTH DAILY 90 tablet 3  . Ascorbic Acid (VITAMIN C PO) Take 1 tablet by mouth 2 (two) times daily.    Marland Kitchen aspirin EC 81 MG tablet Take 81 mg by mouth daily.    . carvedilol (COREG) 3.125 MG tablet Take 1 tablet (3.125 mg total) by mouth 2 (two) times daily with a meal. 45 tablet 2  . clopidogrel (PLAVIX) 75 MG tablet TAKE 1 TABLET(75 MG) BY MOUTH DAILY 30 tablet 11  . clotrimazole-betamethasone (LOTRISONE) cream  Apply 1 application topically daily as needed. mall amount. 30 g 1  . finasteride (PROSCAR) 5 MG tablet Take 5 mg by mouth daily.     Marland Kitchen HYDROcodone-acetaminophen (NORCO/VICODIN) 5-325 MG tablet Take 1 tablet by mouth 2 (two) times daily as needed for moderate pain. 50 tablet 0  . Insulin Glargine (LANTUS SOLOSTAR) 100 UNIT/ML Solostar Pen Inject 25 Units into the skin at bedtime. 15 mL 0  . losartan (COZAAR) 25 MG tablet TAKE 1/2 TABLET(12.5 MG) BY MOUTH DAILY 45 tablet 2  . MAGNESIUM PO Take 1 tablet by mouth every other day.    . mirtazapine (REMERON) 15 MG tablet TAKE 1 TABLET(15 MG) BY MOUTH AT BEDTIME 90 tablet 0  . multivitamin-iron-minerals-folic acid (THERAPEUTIC-M) TABS tablet Take 1 tablet by mouth daily.    Marland Kitchen nystatin (MYCOSTATIN/NYSTOP) powder Apply topically 3 (three) times daily. 60 g 2  . Probiotic Product (PROBIOTIC PO) Take 1 capsule by mouth daily.    . rosuvastatin (CRESTOR) 40 MG tablet TAKE 1 TABLET BY MOUTH DAILY 90 tablet 0  . tamsulosin (FLOMAX) 0.4 MG CAPS capsule TAKE ONE CAPSULE BY MOUTH EVERY DAY 90 capsule 0  . torsemide (DEMADEX) 20 MG tablet Take 1 tablet (20 mg total) every other day by mouth. Start on Monday 03/12/2016 30 tablet 3  . vitamin A 16109 UNIT capsule Take 10,000 Units by mouth daily.    Marland Kitchen zinc sulfate 220 (50 Zn) MG capsule Take 220 mg by mouth daily.     No current facility-administered medications on file prior to visit.     Allergies:  Allergies  Allergen Reactions  . Sulfa Antibiotics Rash    Review of Systems:  CONSTITUTIONAL: No fevers, chills, night sweats, or weight loss.  EYES: No visual changes or eye pain ENT: No hearing changes.  No history of nose bleeds.   RESPIRATORY: No cough, wheezing and shortness of breath.   CARDIOVASCULAR: Negative for chest pain, and palpitations.   GI: Negative for abdominal discomfort, blood in stools or black stools.  No recent change in bowel habits.   GU:  No history of incontinence.     MUSCLOSKELETAL: No history of joint pain or swelling.  No myalgias.   SKIN: Negative for lesions, rash, and itching.   ENDOCRINE: Negative for cold or heat intolerance, polydipsia or goiter.   PSYCH:  No depression or anxiety symptoms.   NEURO: As Above.   Vital Signs:  BP 110/64   Pulse 78   Resp 12   SpO2 97%      General: General:  Thin-appearing, comfortable. Arrived in wheelchair  Neurological Exam: MENTAL STATUS including orientation to time, place, person, recent and remote memory, attention span and concentration, language, and fund of knowledge is normal.  Speech is not dysarthric.  CRANIAL NERVES:  Pupils equal round and reactive to light.  Normal conjugate, extra-ocular eye movements in all directions of gaze.  No ptosis.  Face is symmetric.  MOTOR:  Generalized loss of muscle bulk throughout.  No fasciculations or abnormal movements.  No pronator drift.  Tone is normal.    Right Upper Extremity:    Left Upper Extremity:    Deltoid  5/5   Deltoid  5/5   Biceps  5/5   Biceps  5/5   Triceps  5/5   Triceps  5/5   Wrist extensors  5/5   Wrist extensors  5/5   Wrist flexors  5/5   Wrist flexors  5/5   Finger extensors  5/5   Finger extensors  5/5   Finger flexors  5/5   Finger flexors  5/5   Dorsal interossei  5/5   Dorsal interossei  5/5   Abductor pollicis  5/5   Abductor pollicis  5/5   Tone (Ashworth scale)  0  Tone (Ashworth scale)  0   Right Lower Extremity:    Left Lower Extremity:    Hip flexors  2+/5   Hip flexors  5/5   Hip extensors  3/5   Hip extensors  5/5   Knee flexors  3/5   Knee flexors  5/5   Knee extensors  3/5   Knee extensors  5/5   Dorsiflexors  2/5   Dorsiflexors  2/5   Plantarflexors  2/5   Plantarflexors  5/5   Eversion 2/5  Eversion 2/5  Inversion 2/5  Inversion 4/5  Toe extensors  2/5   Toe extensors  2/5   Toe flexors  2/5   Toe flexors  4/5   Tone (Ashworth scale)  0  Tone (Ashworth scale)  0   MSRs:   Reflexes are 2+/4 throughout, except absent at the Achilles bilaterally.  SENSORY: Reduced vibration at the knees (worse on the right, absent distal to ankles.  Temperature and pin prick is absent in the lower legs bilaterally.  COORDINATION/GAIT:  Gait is not tested as patient is nonambulatory  Data: CT head wo contrast 10/27/2015:  Atrophy with chronic microvascular ischemia. No acute abnormality.  CT head wo contrast 12/16/2015:  1. No acute intracranial findings. 2. Atrophy and chronic white matter microvascular disease.  Lab Results  Component Value Date   HGBA1C 8.7 (H) 04/09/2017   Lab Results  Component Value Date   TSH 2.365 07/19/2016   NCS/EMG of the legs 03/20/2016: This is a complex study. 1. Chronic sensorimotor polyneuropathy, axon loss in type, affecting the lower extremities. Overall, these findings are very severe in degree electrically. Superimposed chronic multilevel intraspinal canal lesion affecting bilateral L2-L5 nerve roots/segments cannot be excluded. 2. Superimposed right femoral mononeuropathy, non-localizable. 3. There is no evidence of a diffuse myopathy.  CT lumbar spine wo contrast 03/14/2016: 1. Focally advanced disc degeneration at L3-4 with retrolisthesis and hyper trophic facet arthropathy. 2. L3-4 advanced and L4-5 moderate to advanced spinal stenosis. 3. L3-4 advanced right and moderate left foraminal narrowing. 4. History of left foot drop. There is L4-5 bilateral subarticular recess narrowing but no focal L5 compression identified.  5. Improved bilateral hydronephrosis compared to 03/07/2016 sonography.  CT thoracic spine wo contrast 03/14/2016: 1. Diffuse disc degeneration without evidence of impingement. 2. Small clustered nodules in the right upper lobe, an infectious pattern. Please correlate for any active respiratory symptoms.    IMPRESSION/PLAN: [name] [name] is a 72 year-old man returning for evaluation of right leg weakness.  Patient underwent right hemiarthroplasty on 12/18/2015 and discharged to rehab facility.  He was ambulating with assistance of physical therapist.  He suffered  a fall at the facility and since this time has been unable to stand or walk. Extensive electrodiagnostic testing shows he has right femoral neuropathy, most likely caused by his fall, causing his asymmetrical leg weakness.  Additionally, he also has severe diabetic peripheral neuropathy and multilevel lumbar radiculopathies involving L2-5 nerve roots, which was also seen on his CT lumbar spine.  I explained to the family that both his diabetic neuropathy and lumbosacral degenerative changes are chronic and not due to his fall, however his femoral neuropathy is most likely traumatic.  Management is supportive.  Greater than 50% of this 25 minute visit was spent in counseling, explanation of diagnosis, planning of further management, and coordination of care.   Thank you for allowing me to participate in patient's care.  If I can answer any additional questions, I would be pleased to do so.    Sincerely,    Tyaisha Cullom K. Allena Katz, DO

## 2017-06-11 ENCOUNTER — Other Ambulatory Visit: Payer: Self-pay | Admitting: Family Medicine

## 2017-06-11 DIAGNOSIS — I25119 Atherosclerotic heart disease of native coronary artery with unspecified angina pectoris: Secondary | ICD-10-CM | POA: Diagnosis not present

## 2017-06-11 DIAGNOSIS — I110 Hypertensive heart disease with heart failure: Secondary | ICD-10-CM | POA: Diagnosis not present

## 2017-06-11 DIAGNOSIS — L89623 Pressure ulcer of left heel, stage 3: Secondary | ICD-10-CM | POA: Diagnosis not present

## 2017-06-11 DIAGNOSIS — I2510 Atherosclerotic heart disease of native coronary artery without angina pectoris: Secondary | ICD-10-CM | POA: Diagnosis not present

## 2017-06-11 DIAGNOSIS — G4730 Sleep apnea, unspecified: Secondary | ICD-10-CM | POA: Diagnosis not present

## 2017-06-11 DIAGNOSIS — I5031 Acute diastolic (congestive) heart failure: Secondary | ICD-10-CM | POA: Diagnosis not present

## 2017-06-11 DIAGNOSIS — L89613 Pressure ulcer of right heel, stage 3: Secondary | ICD-10-CM | POA: Diagnosis not present

## 2017-06-11 DIAGNOSIS — E119 Type 2 diabetes mellitus without complications: Secondary | ICD-10-CM | POA: Diagnosis not present

## 2017-06-11 DIAGNOSIS — E11621 Type 2 diabetes mellitus with foot ulcer: Secondary | ICD-10-CM | POA: Diagnosis not present

## 2017-06-18 DIAGNOSIS — I2510 Atherosclerotic heart disease of native coronary artery without angina pectoris: Secondary | ICD-10-CM | POA: Diagnosis not present

## 2017-06-18 DIAGNOSIS — N312 Flaccid neuropathic bladder, not elsewhere classified: Secondary | ICD-10-CM | POA: Diagnosis not present

## 2017-06-18 DIAGNOSIS — E11621 Type 2 diabetes mellitus with foot ulcer: Secondary | ICD-10-CM | POA: Diagnosis not present

## 2017-06-18 DIAGNOSIS — G4730 Sleep apnea, unspecified: Secondary | ICD-10-CM | POA: Diagnosis not present

## 2017-06-18 DIAGNOSIS — L89623 Pressure ulcer of left heel, stage 3: Secondary | ICD-10-CM | POA: Diagnosis not present

## 2017-06-18 DIAGNOSIS — L89619 Pressure ulcer of right heel, unspecified stage: Secondary | ICD-10-CM | POA: Diagnosis not present

## 2017-06-18 DIAGNOSIS — I110 Hypertensive heart disease with heart failure: Secondary | ICD-10-CM | POA: Diagnosis not present

## 2017-06-18 DIAGNOSIS — I25119 Atherosclerotic heart disease of native coronary artery with unspecified angina pectoris: Secondary | ICD-10-CM | POA: Diagnosis not present

## 2017-06-18 DIAGNOSIS — I5031 Acute diastolic (congestive) heart failure: Secondary | ICD-10-CM | POA: Diagnosis not present

## 2017-06-18 DIAGNOSIS — L89629 Pressure ulcer of left heel, unspecified stage: Secondary | ICD-10-CM | POA: Diagnosis not present

## 2017-06-18 DIAGNOSIS — L89613 Pressure ulcer of right heel, stage 3: Secondary | ICD-10-CM | POA: Diagnosis not present

## 2017-06-18 DIAGNOSIS — E119 Type 2 diabetes mellitus without complications: Secondary | ICD-10-CM | POA: Diagnosis not present

## 2017-06-24 ENCOUNTER — Other Ambulatory Visit: Payer: Self-pay | Admitting: Family Medicine

## 2017-06-25 ENCOUNTER — Encounter (HOSPITAL_BASED_OUTPATIENT_CLINIC_OR_DEPARTMENT_OTHER): Payer: Medicare HMO | Attending: Internal Medicine

## 2017-06-25 DIAGNOSIS — I110 Hypertensive heart disease with heart failure: Secondary | ICD-10-CM | POA: Insufficient documentation

## 2017-06-25 DIAGNOSIS — L89613 Pressure ulcer of right heel, stage 3: Secondary | ICD-10-CM | POA: Insufficient documentation

## 2017-06-25 DIAGNOSIS — I252 Old myocardial infarction: Secondary | ICD-10-CM | POA: Diagnosis not present

## 2017-06-25 DIAGNOSIS — E1140 Type 2 diabetes mellitus with diabetic neuropathy, unspecified: Secondary | ICD-10-CM | POA: Insufficient documentation

## 2017-06-25 DIAGNOSIS — I2510 Atherosclerotic heart disease of native coronary artery without angina pectoris: Secondary | ICD-10-CM | POA: Insufficient documentation

## 2017-06-25 DIAGNOSIS — G4730 Sleep apnea, unspecified: Secondary | ICD-10-CM | POA: Insufficient documentation

## 2017-06-25 DIAGNOSIS — L89623 Pressure ulcer of left heel, stage 3: Secondary | ICD-10-CM | POA: Diagnosis not present

## 2017-06-25 DIAGNOSIS — I5031 Acute diastolic (congestive) heart failure: Secondary | ICD-10-CM | POA: Insufficient documentation

## 2017-07-04 DIAGNOSIS — L89613 Pressure ulcer of right heel, stage 3: Secondary | ICD-10-CM | POA: Diagnosis not present

## 2017-07-04 DIAGNOSIS — I2510 Atherosclerotic heart disease of native coronary artery without angina pectoris: Secondary | ICD-10-CM | POA: Diagnosis not present

## 2017-07-04 DIAGNOSIS — I5031 Acute diastolic (congestive) heart failure: Secondary | ICD-10-CM | POA: Diagnosis not present

## 2017-07-04 DIAGNOSIS — G4730 Sleep apnea, unspecified: Secondary | ICD-10-CM | POA: Diagnosis not present

## 2017-07-04 DIAGNOSIS — E1140 Type 2 diabetes mellitus with diabetic neuropathy, unspecified: Secondary | ICD-10-CM | POA: Diagnosis not present

## 2017-07-04 DIAGNOSIS — I110 Hypertensive heart disease with heart failure: Secondary | ICD-10-CM | POA: Diagnosis not present

## 2017-07-04 DIAGNOSIS — L89623 Pressure ulcer of left heel, stage 3: Secondary | ICD-10-CM | POA: Diagnosis not present

## 2017-07-04 DIAGNOSIS — I252 Old myocardial infarction: Secondary | ICD-10-CM | POA: Diagnosis not present

## 2017-07-09 DIAGNOSIS — L89613 Pressure ulcer of right heel, stage 3: Secondary | ICD-10-CM | POA: Diagnosis not present

## 2017-07-09 DIAGNOSIS — I252 Old myocardial infarction: Secondary | ICD-10-CM | POA: Diagnosis not present

## 2017-07-09 DIAGNOSIS — L89623 Pressure ulcer of left heel, stage 3: Secondary | ICD-10-CM | POA: Diagnosis not present

## 2017-07-09 DIAGNOSIS — E1140 Type 2 diabetes mellitus with diabetic neuropathy, unspecified: Secondary | ICD-10-CM | POA: Diagnosis not present

## 2017-07-09 DIAGNOSIS — I110 Hypertensive heart disease with heart failure: Secondary | ICD-10-CM | POA: Diagnosis not present

## 2017-07-09 DIAGNOSIS — G4730 Sleep apnea, unspecified: Secondary | ICD-10-CM | POA: Diagnosis not present

## 2017-07-09 DIAGNOSIS — I5031 Acute diastolic (congestive) heart failure: Secondary | ICD-10-CM | POA: Diagnosis not present

## 2017-07-09 DIAGNOSIS — I2510 Atherosclerotic heart disease of native coronary artery without angina pectoris: Secondary | ICD-10-CM | POA: Diagnosis not present

## 2017-07-16 DIAGNOSIS — I110 Hypertensive heart disease with heart failure: Secondary | ICD-10-CM | POA: Diagnosis not present

## 2017-07-16 DIAGNOSIS — I2510 Atherosclerotic heart disease of native coronary artery without angina pectoris: Secondary | ICD-10-CM | POA: Diagnosis not present

## 2017-07-16 DIAGNOSIS — N1330 Unspecified hydronephrosis: Secondary | ICD-10-CM | POA: Diagnosis not present

## 2017-07-16 DIAGNOSIS — G4730 Sleep apnea, unspecified: Secondary | ICD-10-CM | POA: Diagnosis not present

## 2017-07-16 DIAGNOSIS — L89613 Pressure ulcer of right heel, stage 3: Secondary | ICD-10-CM | POA: Diagnosis not present

## 2017-07-16 DIAGNOSIS — I252 Old myocardial infarction: Secondary | ICD-10-CM | POA: Diagnosis not present

## 2017-07-16 DIAGNOSIS — E1140 Type 2 diabetes mellitus with diabetic neuropathy, unspecified: Secondary | ICD-10-CM | POA: Diagnosis not present

## 2017-07-16 DIAGNOSIS — I5031 Acute diastolic (congestive) heart failure: Secondary | ICD-10-CM | POA: Diagnosis not present

## 2017-07-16 DIAGNOSIS — L89623 Pressure ulcer of left heel, stage 3: Secondary | ICD-10-CM | POA: Diagnosis not present

## 2017-07-25 ENCOUNTER — Encounter (HOSPITAL_BASED_OUTPATIENT_CLINIC_OR_DEPARTMENT_OTHER): Payer: Medicare HMO | Attending: Internal Medicine

## 2017-07-25 DIAGNOSIS — I5031 Acute diastolic (congestive) heart failure: Secondary | ICD-10-CM | POA: Insufficient documentation

## 2017-07-25 DIAGNOSIS — I25119 Atherosclerotic heart disease of native coronary artery with unspecified angina pectoris: Secondary | ICD-10-CM | POA: Insufficient documentation

## 2017-07-25 DIAGNOSIS — I252 Old myocardial infarction: Secondary | ICD-10-CM | POA: Insufficient documentation

## 2017-07-25 DIAGNOSIS — G4730 Sleep apnea, unspecified: Secondary | ICD-10-CM | POA: Diagnosis not present

## 2017-07-25 DIAGNOSIS — L89613 Pressure ulcer of right heel, stage 3: Secondary | ICD-10-CM | POA: Insufficient documentation

## 2017-07-25 DIAGNOSIS — I110 Hypertensive heart disease with heart failure: Secondary | ICD-10-CM | POA: Diagnosis not present

## 2017-07-25 DIAGNOSIS — N312 Flaccid neuropathic bladder, not elsewhere classified: Secondary | ICD-10-CM | POA: Diagnosis not present

## 2017-07-25 DIAGNOSIS — E1140 Type 2 diabetes mellitus with diabetic neuropathy, unspecified: Secondary | ICD-10-CM | POA: Diagnosis not present

## 2017-07-25 DIAGNOSIS — L89622 Pressure ulcer of left heel, stage 2: Secondary | ICD-10-CM | POA: Insufficient documentation

## 2017-07-29 ENCOUNTER — Other Ambulatory Visit: Payer: Self-pay | Admitting: Family Medicine

## 2017-08-01 ENCOUNTER — Other Ambulatory Visit (HOSPITAL_COMMUNITY)
Admission: RE | Admit: 2017-08-01 | Discharge: 2017-08-01 | Disposition: A | Discharge status: Home / Self Care | Payer: Medicare HMO | Source: Other Acute Inpatient Hospital | Attending: Internal Medicine | Admitting: Internal Medicine

## 2017-08-01 DIAGNOSIS — I25119 Atherosclerotic heart disease of native coronary artery with unspecified angina pectoris: Secondary | ICD-10-CM | POA: Diagnosis not present

## 2017-08-01 DIAGNOSIS — I252 Old myocardial infarction: Secondary | ICD-10-CM | POA: Diagnosis not present

## 2017-08-01 DIAGNOSIS — L89613 Pressure ulcer of right heel, stage 3: Secondary | ICD-10-CM | POA: Diagnosis not present

## 2017-08-01 DIAGNOSIS — L89622 Pressure ulcer of left heel, stage 2: Secondary | ICD-10-CM | POA: Diagnosis not present

## 2017-08-01 DIAGNOSIS — I5031 Acute diastolic (congestive) heart failure: Secondary | ICD-10-CM | POA: Diagnosis not present

## 2017-08-01 DIAGNOSIS — I110 Hypertensive heart disease with heart failure: Secondary | ICD-10-CM | POA: Diagnosis not present

## 2017-08-01 DIAGNOSIS — E1140 Type 2 diabetes mellitus with diabetic neuropathy, unspecified: Secondary | ICD-10-CM | POA: Diagnosis not present

## 2017-08-01 DIAGNOSIS — G4730 Sleep apnea, unspecified: Secondary | ICD-10-CM | POA: Diagnosis not present

## 2017-08-04 ENCOUNTER — Other Ambulatory Visit (HOSPITAL_COMMUNITY): Payer: Self-pay | Admitting: Internal Medicine

## 2017-08-04 ENCOUNTER — Other Ambulatory Visit: Payer: Self-pay | Admitting: Family Medicine

## 2017-08-04 LAB — AEROBIC CULTURE W GRAM STAIN (SUPERFICIAL SPECIMEN)
Culture: NO GROWTH
Gram Stain: NONE SEEN

## 2017-08-08 DIAGNOSIS — L89613 Pressure ulcer of right heel, stage 3: Secondary | ICD-10-CM | POA: Diagnosis not present

## 2017-08-08 DIAGNOSIS — I25119 Atherosclerotic heart disease of native coronary artery with unspecified angina pectoris: Secondary | ICD-10-CM | POA: Diagnosis not present

## 2017-08-08 DIAGNOSIS — I252 Old myocardial infarction: Secondary | ICD-10-CM | POA: Diagnosis not present

## 2017-08-08 DIAGNOSIS — I5031 Acute diastolic (congestive) heart failure: Secondary | ICD-10-CM | POA: Diagnosis not present

## 2017-08-08 DIAGNOSIS — L89622 Pressure ulcer of left heel, stage 2: Secondary | ICD-10-CM | POA: Diagnosis not present

## 2017-08-08 DIAGNOSIS — L97422 Non-pressure chronic ulcer of left heel and midfoot with fat layer exposed: Secondary | ICD-10-CM | POA: Diagnosis not present

## 2017-08-08 DIAGNOSIS — I110 Hypertensive heart disease with heart failure: Secondary | ICD-10-CM | POA: Diagnosis not present

## 2017-08-08 DIAGNOSIS — L97412 Non-pressure chronic ulcer of right heel and midfoot with fat layer exposed: Secondary | ICD-10-CM | POA: Diagnosis not present

## 2017-08-08 DIAGNOSIS — E1140 Type 2 diabetes mellitus with diabetic neuropathy, unspecified: Secondary | ICD-10-CM | POA: Diagnosis not present

## 2017-08-08 DIAGNOSIS — G4730 Sleep apnea, unspecified: Secondary | ICD-10-CM | POA: Diagnosis not present

## 2017-08-15 DIAGNOSIS — G4730 Sleep apnea, unspecified: Secondary | ICD-10-CM | POA: Diagnosis not present

## 2017-08-15 DIAGNOSIS — E1140 Type 2 diabetes mellitus with diabetic neuropathy, unspecified: Secondary | ICD-10-CM | POA: Diagnosis not present

## 2017-08-15 DIAGNOSIS — I110 Hypertensive heart disease with heart failure: Secondary | ICD-10-CM | POA: Diagnosis not present

## 2017-08-15 DIAGNOSIS — I25119 Atherosclerotic heart disease of native coronary artery with unspecified angina pectoris: Secondary | ICD-10-CM | POA: Diagnosis not present

## 2017-08-15 DIAGNOSIS — L89622 Pressure ulcer of left heel, stage 2: Secondary | ICD-10-CM | POA: Diagnosis not present

## 2017-08-15 DIAGNOSIS — I5031 Acute diastolic (congestive) heart failure: Secondary | ICD-10-CM | POA: Diagnosis not present

## 2017-08-15 DIAGNOSIS — I252 Old myocardial infarction: Secondary | ICD-10-CM | POA: Diagnosis not present

## 2017-08-15 DIAGNOSIS — L89613 Pressure ulcer of right heel, stage 3: Secondary | ICD-10-CM | POA: Diagnosis not present

## 2017-08-15 DIAGNOSIS — E11622 Type 2 diabetes mellitus with other skin ulcer: Secondary | ICD-10-CM | POA: Diagnosis not present

## 2017-08-15 DIAGNOSIS — L97222 Non-pressure chronic ulcer of left calf with fat layer exposed: Secondary | ICD-10-CM | POA: Diagnosis not present

## 2017-08-22 ENCOUNTER — Encounter (HOSPITAL_COMMUNITY): Payer: Self-pay | Admitting: Internal Medicine

## 2017-08-22 ENCOUNTER — Other Ambulatory Visit: Payer: Self-pay

## 2017-08-22 ENCOUNTER — Ambulatory Visit (HOSPITAL_COMMUNITY)
Admission: RE | Admit: 2017-08-22 | Discharge: 2017-08-22 | Disposition: A | Discharge status: Home / Self Care | Payer: Medicare HMO | Source: Ambulatory Visit | Attending: Internal Medicine | Admitting: Internal Medicine

## 2017-08-22 ENCOUNTER — Ambulatory Visit (INDEPENDENT_AMBULATORY_CARE_PROVIDER_SITE_OTHER): Payer: Medicare HMO | Admitting: *Deleted

## 2017-08-22 VITALS — BP 108/68 | HR 77

## 2017-08-22 DIAGNOSIS — G4730 Sleep apnea, unspecified: Secondary | ICD-10-CM | POA: Insufficient documentation

## 2017-08-22 DIAGNOSIS — M109 Gout, unspecified: Secondary | ICD-10-CM | POA: Insufficient documentation

## 2017-08-22 DIAGNOSIS — I130 Hypertensive heart and chronic kidney disease with heart failure and stage 1 through stage 4 chronic kidney disease, or unspecified chronic kidney disease: Secondary | ICD-10-CM | POA: Diagnosis not present

## 2017-08-22 DIAGNOSIS — E875 Hyperkalemia: Secondary | ICD-10-CM | POA: Insufficient documentation

## 2017-08-22 DIAGNOSIS — Z79899 Other long term (current) drug therapy: Secondary | ICD-10-CM | POA: Diagnosis not present

## 2017-08-22 DIAGNOSIS — Z8673 Personal history of transient ischemic attack (TIA), and cerebral infarction without residual deficits: Secondary | ICD-10-CM | POA: Diagnosis not present

## 2017-08-22 DIAGNOSIS — G9520 Unspecified cord compression: Secondary | ICD-10-CM | POA: Insufficient documentation

## 2017-08-22 DIAGNOSIS — I472 Ventricular tachycardia: Secondary | ICD-10-CM | POA: Insufficient documentation

## 2017-08-22 DIAGNOSIS — I255 Ischemic cardiomyopathy: Secondary | ICD-10-CM | POA: Insufficient documentation

## 2017-08-22 DIAGNOSIS — N319 Neuromuscular dysfunction of bladder, unspecified: Secondary | ICD-10-CM | POA: Insufficient documentation

## 2017-08-22 DIAGNOSIS — Z7982 Long term (current) use of aspirin: Secondary | ICD-10-CM | POA: Insufficient documentation

## 2017-08-22 DIAGNOSIS — G8220 Paraplegia, unspecified: Secondary | ICD-10-CM | POA: Insufficient documentation

## 2017-08-22 DIAGNOSIS — N184 Chronic kidney disease, stage 4 (severe): Secondary | ICD-10-CM | POA: Insufficient documentation

## 2017-08-22 DIAGNOSIS — I252 Old myocardial infarction: Secondary | ICD-10-CM | POA: Diagnosis not present

## 2017-08-22 DIAGNOSIS — Z794 Long term (current) use of insulin: Secondary | ICD-10-CM | POA: Diagnosis not present

## 2017-08-22 DIAGNOSIS — N320 Bladder-neck obstruction: Secondary | ICD-10-CM | POA: Diagnosis not present

## 2017-08-22 DIAGNOSIS — Z882 Allergy status to sulfonamides status: Secondary | ICD-10-CM | POA: Insufficient documentation

## 2017-08-22 DIAGNOSIS — Z955 Presence of coronary angioplasty implant and graft: Secondary | ICD-10-CM | POA: Insufficient documentation

## 2017-08-22 DIAGNOSIS — Z993 Dependence on wheelchair: Secondary | ICD-10-CM | POA: Insufficient documentation

## 2017-08-22 DIAGNOSIS — I5042 Chronic combined systolic (congestive) and diastolic (congestive) heart failure: Secondary | ICD-10-CM | POA: Insufficient documentation

## 2017-08-22 DIAGNOSIS — Z7902 Long term (current) use of antithrombotics/antiplatelets: Secondary | ICD-10-CM | POA: Diagnosis not present

## 2017-08-22 DIAGNOSIS — E1122 Type 2 diabetes mellitus with diabetic chronic kidney disease: Secondary | ICD-10-CM | POA: Insufficient documentation

## 2017-08-22 DIAGNOSIS — Z95810 Presence of automatic (implantable) cardiac defibrillator: Secondary | ICD-10-CM | POA: Diagnosis not present

## 2017-08-22 DIAGNOSIS — I2510 Atherosclerotic heart disease of native coronary artery without angina pectoris: Secondary | ICD-10-CM | POA: Insufficient documentation

## 2017-08-22 LAB — BASIC METABOLIC PANEL
Anion gap: 9 (ref 5–15)
BUN: 31 mg/dL — AB (ref 6–20)
CALCIUM: 8.9 mg/dL (ref 8.9–10.3)
CO2: 27 mmol/L (ref 22–32)
CREATININE: 2.16 mg/dL — AB (ref 0.61–1.24)
Chloride: 107 mmol/L (ref 101–111)
GFR calc Af Amer: 34 mL/min — ABNORMAL LOW (ref >60)
GFR calc non Af Amer: 29 mL/min — ABNORMAL LOW (ref >60)
GLUCOSE: 226 mg/dL — AB (ref 65–99)
Potassium: 4.9 mmol/L (ref 3.5–5.1)
Sodium: 143 mmol/L (ref 135–145)

## 2017-08-22 LAB — BRAIN NATRIURETIC PEPTIDE: B Natriuretic Peptide: 260 pg/mL — ABNORMAL HIGH (ref 0.0–100.0)

## 2017-08-22 MED ORDER — LOSARTAN POTASSIUM 25 MG PO TABS: 12.5000 mg | ORAL_TABLET | Freq: Two times a day (BID) | ORAL | 2 refills | Status: DC

## 2017-08-22 NOTE — Patient Instructions (Signed)
Increase Losartan to 12.5 (1/2 tab) Twice daily   Labs today  Your physician recommends that you schedule a follow-up appointment in: 4 months
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
## 2017-08-22 NOTE — Progress Notes (Signed)
Remote ICD transmission.   

## 2017-08-27 ENCOUNTER — Encounter: Payer: Self-pay | Admitting: Cardiology

## 2017-08-27 ENCOUNTER — Other Ambulatory Visit (HOSPITAL_COMMUNITY): Payer: Self-pay | Admitting: Family Medicine

## 2017-08-27 ENCOUNTER — Encounter (HOSPITAL_BASED_OUTPATIENT_CLINIC_OR_DEPARTMENT_OTHER): Payer: Medicare HMO | Attending: Internal Medicine

## 2017-08-27 DIAGNOSIS — L89523 Pressure ulcer of left ankle, stage 3: Secondary | ICD-10-CM | POA: Diagnosis not present

## 2017-08-27 DIAGNOSIS — E11621 Type 2 diabetes mellitus with foot ulcer: Secondary | ICD-10-CM | POA: Diagnosis not present

## 2017-08-27 DIAGNOSIS — I252 Old myocardial infarction: Secondary | ICD-10-CM | POA: Insufficient documentation

## 2017-08-27 DIAGNOSIS — L97822 Non-pressure chronic ulcer of other part of left lower leg with fat layer exposed: Secondary | ICD-10-CM | POA: Insufficient documentation

## 2017-08-27 DIAGNOSIS — I2510 Atherosclerotic heart disease of native coronary artery without angina pectoris: Secondary | ICD-10-CM | POA: Diagnosis not present

## 2017-08-27 DIAGNOSIS — I509 Heart failure, unspecified: Secondary | ICD-10-CM | POA: Insufficient documentation

## 2017-08-27 DIAGNOSIS — N312 Flaccid neuropathic bladder, not elsewhere classified: Secondary | ICD-10-CM | POA: Diagnosis not present

## 2017-08-27 DIAGNOSIS — L89513 Pressure ulcer of right ankle, stage 3: Secondary | ICD-10-CM | POA: Diagnosis not present

## 2017-08-27 DIAGNOSIS — G4730 Sleep apnea, unspecified: Secondary | ICD-10-CM | POA: Diagnosis not present

## 2017-08-27 DIAGNOSIS — L89623 Pressure ulcer of left heel, stage 3: Secondary | ICD-10-CM | POA: Diagnosis not present

## 2017-08-27 DIAGNOSIS — I25119 Atherosclerotic heart disease of native coronary artery with unspecified angina pectoris: Secondary | ICD-10-CM | POA: Diagnosis not present

## 2017-08-27 DIAGNOSIS — I5031 Acute diastolic (congestive) heart failure: Secondary | ICD-10-CM | POA: Insufficient documentation

## 2017-08-27 DIAGNOSIS — E1140 Type 2 diabetes mellitus with diabetic neuropathy, unspecified: Secondary | ICD-10-CM | POA: Insufficient documentation

## 2017-08-27 DIAGNOSIS — I110 Hypertensive heart disease with heart failure: Secondary | ICD-10-CM | POA: Diagnosis not present

## 2017-08-27 DIAGNOSIS — L89613 Pressure ulcer of right heel, stage 3: Secondary | ICD-10-CM | POA: Diagnosis not present

## 2017-09-05 DIAGNOSIS — I509 Heart failure, unspecified: Secondary | ICD-10-CM | POA: Diagnosis not present

## 2017-09-05 DIAGNOSIS — E11621 Type 2 diabetes mellitus with foot ulcer: Secondary | ICD-10-CM | POA: Diagnosis not present

## 2017-09-05 DIAGNOSIS — L89623 Pressure ulcer of left heel, stage 3: Secondary | ICD-10-CM | POA: Diagnosis not present

## 2017-09-05 DIAGNOSIS — L89523 Pressure ulcer of left ankle, stage 3: Secondary | ICD-10-CM | POA: Diagnosis not present

## 2017-09-05 DIAGNOSIS — E11622 Type 2 diabetes mellitus with other skin ulcer: Secondary | ICD-10-CM | POA: Diagnosis not present

## 2017-09-05 DIAGNOSIS — I110 Hypertensive heart disease with heart failure: Secondary | ICD-10-CM | POA: Diagnosis not present

## 2017-09-05 DIAGNOSIS — L89513 Pressure ulcer of right ankle, stage 3: Secondary | ICD-10-CM | POA: Diagnosis not present

## 2017-09-05 DIAGNOSIS — L89613 Pressure ulcer of right heel, stage 3: Secondary | ICD-10-CM | POA: Diagnosis not present

## 2017-09-05 DIAGNOSIS — I25119 Atherosclerotic heart disease of native coronary artery with unspecified angina pectoris: Secondary | ICD-10-CM | POA: Diagnosis not present

## 2017-09-05 DIAGNOSIS — I5031 Acute diastolic (congestive) heart failure: Secondary | ICD-10-CM | POA: Diagnosis not present

## 2017-09-05 DIAGNOSIS — L97222 Non-pressure chronic ulcer of left calf with fat layer exposed: Secondary | ICD-10-CM | POA: Diagnosis not present

## 2017-09-05 DIAGNOSIS — L97822 Non-pressure chronic ulcer of other part of left lower leg with fat layer exposed: Secondary | ICD-10-CM | POA: Diagnosis not present

## 2017-09-05 DIAGNOSIS — G4730 Sleep apnea, unspecified: Secondary | ICD-10-CM | POA: Diagnosis not present

## 2017-09-10 ENCOUNTER — Encounter: Payer: Self-pay | Admitting: Family Medicine

## 2017-09-10 ENCOUNTER — Ambulatory Visit (INDEPENDENT_AMBULATORY_CARE_PROVIDER_SITE_OTHER): Payer: Medicare HMO | Admitting: Family Medicine

## 2017-09-10 VITALS — BP 124/76 | HR 66 | Temp 97.9°F | Resp 16 | Ht 74.0 in

## 2017-09-10 DIAGNOSIS — N183 Chronic kidney disease, stage 3 unspecified: Secondary | ICD-10-CM

## 2017-09-10 DIAGNOSIS — IMO0002 Reserved for concepts with insufficient information to code with codable children: Secondary | ICD-10-CM

## 2017-09-10 DIAGNOSIS — E1165 Type 2 diabetes mellitus with hyperglycemia: Secondary | ICD-10-CM | POA: Diagnosis not present

## 2017-09-10 DIAGNOSIS — I130 Hypertensive heart and chronic kidney disease with heart failure and stage 1 through stage 4 chronic kidney disease, or unspecified chronic kidney disease: Secondary | ICD-10-CM

## 2017-09-10 DIAGNOSIS — E1149 Type 2 diabetes mellitus with other diabetic neurological complication: Secondary | ICD-10-CM

## 2017-09-10 LAB — CUP PACEART REMOTE DEVICE CHECK
Brady Statistic RV Percent Paced: 1 %
Date Time Interrogation Session: 20190502183548
HIGH POWER IMPEDANCE MEASURED VALUE: 47 Ohm
HIGH POWER IMPEDANCE MEASURED VALUE: 47 Ohm
Implantable Lead Implant Date: 20070518
Implantable Lead Model: 7001
Lead Channel Impedance Value: 530 Ohm
Lead Channel Pacing Threshold Amplitude: 0.75 V
Lead Channel Pacing Threshold Pulse Width: 0.5 ms
Lead Channel Setting Sensing Sensitivity: 0.5 mV
MDC IDC LEAD LOCATION: 753860
MDC IDC MSMT BATTERY REMAINING LONGEVITY: 62 mo
MDC IDC MSMT BATTERY REMAINING PERCENTAGE: 60 %
MDC IDC MSMT BATTERY VOLTAGE: 2.93 V
MDC IDC MSMT LEADCHNL RV SENSING INTR AMPL: 12 mV
MDC IDC PG IMPLANT DT: 20140702
MDC IDC PG SERIAL: 1109991
MDC IDC SET LEADCHNL RV PACING AMPLITUDE: 2.5 V
MDC IDC SET LEADCHNL RV PACING PULSEWIDTH: 0.5 ms

## 2017-09-10 LAB — MICROALBUMIN / CREATININE URINE RATIO
Creatinine,U: 17.2 mg/dL
MICROALB UR: 2.9 mg/dL — AB (ref 0.0–1.9)
Microalb Creat Ratio: 17 mg/g (ref 0.0–30.0)

## 2017-09-10 LAB — HEMOGLOBIN A1C: HEMOGLOBIN A1C: 7.7 % — AB (ref 4.6–6.5)

## 2017-09-10 NOTE — Progress Notes (Signed)
Today     HPI:   [name] is a 72 y.o. male, who is here today with his daughter for 5 months follow up.   He was last seen on 04/09/18.  Since his last OV he has seen urologist, cardiologist, and neurologist. He still have decubitus ulcers in lower extremities, he is going to the wound clinic once per week, appt today. Sacral decubitus ulcer has healed. Not ambulatory, still has urine cath.   Hypertension: Currently he is on Coreg 3.125 mg twice daily and Losartan 25 mg 1/2 tablet daily. No chest pain, dyspnea, palpitations, or lower extremity edema. CHF, currently he is on torsemide 20 mg every other day. Echo 12/2016 LVEF 15-20%   CKD 3, creatinine baseline 1.7 and e GFR low 30's. Recent BMP with Cr 2.1 and e GFR 29. He has not noted decreased urine or gross hematuria.    Diabetes Mellitus II:   Currently on Lantus 25 units daily.  Checking BS's : 100-150 Hypoglycemia: Denies.  He is tolerating medications well. Negative for abdominal pain, nausea, vomiting, polydipsia, polyuria, or polyphagia.  Peripheral neuropathy, he is on Hydrocodone-acetaminophen 5-325 mg twice daily as needed for pain. Pain is exacerbated by walking and standing,he is most of the time sitting, so he has not taken medication.  Lab Results  Component Value Date   CREATININE 2.16 (H) 08/22/2017   BUN 31 (H) 08/22/2017   NA 143 08/22/2017   K 4.9 08/22/2017   CL 107 08/22/2017   CO2 27 08/22/2017    Lab Results  Component Value Date   HGBA1C 8.7 (H) 04/09/2017   Lab Results  Component Value Date   MICROALBUR 9.3 (H) 04/09/2017   Last eye exam over a year ago.  Depression and insomnia: Currently he is on Remeron 15 mg daily. Medication is still helping with sleep and depression. No side effects reported.   Review of Systems  Constitutional: Negative for activity change, appetite change, fatigue and fever.  HENT: Negative for nosebleeds, sore throat and trouble  swallowing.   Eyes: Negative for redness and visual disturbance.  Respiratory: Negative for cough, shortness of breath and wheezing.   Cardiovascular: Negative for chest pain, palpitations and leg swelling.  Gastrointestinal: Negative for abdominal pain, nausea and vomiting.  Endocrine: Negative for polydipsia, polyphagia and polyuria.  Genitourinary: Negative for decreased urine volume, dysuria and hematuria.  Musculoskeletal: Positive for arthralgias and gait problem.  Skin: Positive for wound. Negative for rash.  Neurological: Negative for syncope, weakness and headaches.  Psychiatric/Behavioral: Positive for sleep disturbance. Negative for confusion.     Current Outpatient Medications on File Prior to Visit  Medication Sig Dispense Refill  . amiodarone (PACERONE) 200 MG tablet TAKE 1 TABLET(200 MG) BY MOUTH DAILY 90 tablet 3  . Ascorbic Acid (VITAMIN C PO) Take 1 tablet by mouth 2 (two) times daily.    Marland Kitchen aspirin EC 81 MG tablet Take 81 mg by mouth daily.    . carvedilol (COREG) 3.125 MG tablet Take 1 tablet (3.125 mg total) by mouth 2 (two) times daily with a meal. 45 tablet 2  . clopidogrel (PLAVIX) 75 MG tablet TAKE 1 TABLET(75 MG) BY MOUTH DAILY 60 tablet 0  . clotrimazole-betamethasone (LOTRISONE) cream Apply 1 application topically daily as needed. mall amount. 30 g 1  . finasteride (PROSCAR) 5 MG tablet Take 5 mg by mouth daily.     Marland Kitchen HYDROcodone-acetaminophen (NORCO/VICODIN) 5-325 MG tablet Take 1 tablet by mouth 2 (two) times daily as  needed for moderate pain. 50 tablet 0  . Insulin Glargine (LANTUS SOLOSTAR) 100 UNIT/ML Solostar Pen Inject 25 Units into the skin at bedtime. 15 mL 0  . losartan (COZAAR) 25 MG tablet Take 0.5 tablets (12.5 mg total) by mouth 2 (two) times daily. 90 tablet 2  . MAGNESIUM PO Take 1 tablet by mouth every other day.    . mirtazapine (REMERON) 15 MG tablet TAKE 1 TABLET(15 MG) BY MOUTH AT BEDTIME 90 tablet 1  . multivitamin-iron-minerals-folic acid  (THERAPEUTIC-M) TABS tablet Take 1 tablet by mouth daily.    Marland Kitchen nystatin (MYCOSTATIN/NYSTOP) powder Apply topically 3 (three) times daily. 60 g 2  . Probiotic Product (PROBIOTIC PO) Take 1 capsule by mouth daily.    . rosuvastatin (CRESTOR) 40 MG tablet TAKE 1 TABLET BY MOUTH DAILY 90 tablet 0  . tamsulosin (FLOMAX) 0.4 MG CAPS capsule TAKE ONE CAPSULE BY MOUTH EVERY DAY 90 capsule 0  . torsemide (DEMADEX) 20 MG tablet Take 1 tablet (20 mg total) every other day by mouth. Start on Monday 03/12/2016 30 tablet 3  . vitamin A 10000 UNIT capsule Take 10,000 Units by mouth daily.    Marland Kitchen zinc sulfate 220 (50 Zn) MG capsule Take 220 mg by mouth daily.     No current facility-administered medications on file prior to visit.      Past Medical History:  Diagnosis Date  . Automatic implantable cardioverter-defibrillator in situ   . CAD (coronary artery disease)    a. LHC (08/2005):  Ostial LAD 99%, mid LAD 50%, superior D1 80%, inferior D1 75%, mid OM proximal 80%, proximal PDA 25-30%, mid RCA 99%.   MRI with full thickness scar.  Med Rx.  2015 demonstrated similar diffuse three-vessel coronary disease. Perfusion imaging with rest we distribution demonstrated no viability in the area of the LAD and circumflex. Medical management.  . CAD, multiple vessel, RCA 100% mid occl.; LAD 99% stenosisprox.  mid of 50-60%; LCX OM-1 90%, OM-2 99%,AV groove 80%  12/10/2013  . Chronic combined systolic and diastolic heart failure ([place])   . CKD (chronic kidney disease)   . Gout   . Hx of echocardiogram 2015   Echo (08/2013):  EF 10-15%, diff HK, mod LAE, severe RVE, mod reduced RVSF, mild RAE, PASP 41 mmHg.  Marland Kitchen Hypertension   . Ischemic cardiomyopathy    Echo (8/11):  EF 40-45%;  Echo (5/15):  EF 10-15%  . Myocardial infarction [facility]) 2007   out of hosp MI - LHC with 3v CAD rx medically  . NSVT (nonsustained ventricular tachycardia) (HCC)    s/p ICD  . Peripheral neuropathy   . Sleep apnea    "has CPAP; won't use it"    . Stroke [facility]) ~ 2013   "right leg weak since" (12/09/2013)  . Type II diabetes mellitus ([place]) dx'd 2005   Allergies  Allergen Reactions  . Sulfa Antibiotics Rash    Social History   Socioeconomic History  . Marital status: Married    Spouse name: Not on file  . Number of children: Not on file  . Years of education: Not on file  . Highest education level: Not on file  Occupational History  . Occupation: retired  Scientific laboratory technician  . Financial resource strain: Not on file  . Food insecurity:    Worry: Not on file    Inability: Not on file  . Transportation needs:    Medical: Not on file    Non-medical: Not on file  Tobacco  Use  . Smoking status: Never Smoker  . Smokeless tobacco: Never Used  Substance and Sexual Activity  . Alcohol use: No  . Drug use: No  . Sexual activity: Not on file  Lifestyle  . Physical activity:    Days per week: Not on file    Minutes per session: Not on file  . Stress: Not on file  Relationships  . Social connections:    Talks on phone: Not on file    Gets together: Not on file    Attends religious service: Not on file    Active member of club or organization: Not on file    Attends meetings of clubs or organizations: Not on file    Relationship status: Not on file  Other Topics Concern  . 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   He lives with wife.   Highest level of education:  High school    Vitals:   09/10/17 1117  BP: 124/76  Pulse: 66  Resp: 16  Temp: 97.9 F (36.6 C)  SpO2: 97%   Body mass index is 23.11 kg/m.   Physical Exam  Nursing note and vitals reviewed. Constitutional: He is oriented to person, place, and time. He appears well-developed. No distress.  HENT:  Head: Normocephalic and atraumatic.  Mouth/Throat: Oropharynx is clear and moist and mucous membranes are normal.  Edentulous.   Eyes: Conjunctivae are normal.  Cardiovascular: Normal rate and regular rhythm.  Murmur (SEM I/VI base.) heard. Respiratory: Effort normal and breath sounds normal. No respiratory distress.  GI: Soft. There is no tenderness.  Musculoskeletal: He exhibits no edema.  Lymphadenopathy:    He has no cervical adenopathy.  Neurological: He is alert and oriented to person, place, and time.  No focal deficit appreciated, he is in a wheel chair.  Skin: Skin is warm. No erythema.  Dressing on wounds LE, around ankle, bilateral.  Psychiatric: He has a normal mood and affect. Cognition and memory are normal.  Fairly groomed, good eye contact.      ASSESSMENT AND PLAN:   Mr. COPELAN MAULTSBY was seen today for 5 months follow-up.  Orders Placed This Encounter  Procedures  . Microalbumin / creatinine urine ratio  . Hemoglobin A1c  . Basic metabolic panel   Lab Results  Component Value Date   HGBA1C 7.7 (H) 09/10/2017    Lab Results  Component Value Date   MICROALBUR 2.9 (H) 09/10/2017    1. Type II diabetes mellitus with neurological manifestations, uncontrolled ([place]) HgA1C  pending today. No changes in current management, Lantus will be adjusted according to A1C result. Healthy diet with avoidance of added sugar food intake is an important part of treatment and recommended. Annual eye exam and periodic foot care recommended. F/U in 5-6 months  - Microalbumin / creatinine urine ratio - Hemoglobin A1c  2. CKD (chronic kidney disease) stage 3, GFR 30-59 ml/min (HCC)  Continue Losartan. Low salt diet. No NSAIDs. Adequate diabetes on BP control. Further recommendation will be given according to BMP results,in 3-4 weeks.  - Basic metabolic panel; Future  3. Hypertension with congestive heart failure and renal failure (HCC)  Adequately controlled today. No changes in current management. Continue low-salt diet and pediatric eye exam. Follow-up in 5 to 6  months.     [name] G. [name], MD  [facility].  [place] office.

## 2017-09-10 NOTE — Patient Instructions (Addendum)
A few things to remember from today's visit:   Type II diabetes mellitus with neurological manifestations, uncontrolled ([place]) - Plan: Microalbumin / creatinine urine ratio, Hemoglobin A1c  CKD (chronic kidney disease) stage 3, GFR 30-59 ml/min (HCC)  Lab in 3-4 weeks.  Please be sure medication list is accurate. If a new problem present, please set up appointment sooner than planned today.

## 2017-09-12 DIAGNOSIS — I509 Heart failure, unspecified: Secondary | ICD-10-CM | POA: Diagnosis not present

## 2017-09-12 DIAGNOSIS — I110 Hypertensive heart disease with heart failure: Secondary | ICD-10-CM | POA: Diagnosis not present

## 2017-09-12 DIAGNOSIS — I25119 Atherosclerotic heart disease of native coronary artery with unspecified angina pectoris: Secondary | ICD-10-CM | POA: Diagnosis not present

## 2017-09-12 DIAGNOSIS — G4730 Sleep apnea, unspecified: Secondary | ICD-10-CM | POA: Diagnosis not present

## 2017-09-12 DIAGNOSIS — L89523 Pressure ulcer of left ankle, stage 3: Secondary | ICD-10-CM | POA: Diagnosis not present

## 2017-09-12 DIAGNOSIS — L97822 Non-pressure chronic ulcer of other part of left lower leg with fat layer exposed: Secondary | ICD-10-CM | POA: Diagnosis not present

## 2017-09-12 DIAGNOSIS — L97222 Non-pressure chronic ulcer of left calf with fat layer exposed: Secondary | ICD-10-CM | POA: Diagnosis not present

## 2017-09-12 DIAGNOSIS — I5031 Acute diastolic (congestive) heart failure: Secondary | ICD-10-CM | POA: Diagnosis not present

## 2017-09-12 DIAGNOSIS — E11622 Type 2 diabetes mellitus with other skin ulcer: Secondary | ICD-10-CM | POA: Diagnosis not present

## 2017-09-12 DIAGNOSIS — L89513 Pressure ulcer of right ankle, stage 3: Secondary | ICD-10-CM | POA: Diagnosis not present

## 2017-09-12 DIAGNOSIS — E11621 Type 2 diabetes mellitus with foot ulcer: Secondary | ICD-10-CM | POA: Diagnosis not present

## 2017-09-16 ENCOUNTER — Other Ambulatory Visit: Payer: Self-pay | Admitting: Family Medicine

## 2017-09-16 ENCOUNTER — Other Ambulatory Visit (HOSPITAL_COMMUNITY): Payer: Self-pay | Admitting: Internal Medicine

## 2017-09-24 ENCOUNTER — Encounter (HOSPITAL_BASED_OUTPATIENT_CLINIC_OR_DEPARTMENT_OTHER): Payer: Medicare HMO | Attending: Internal Medicine

## 2017-09-24 ENCOUNTER — Telehealth: Payer: Self-pay | Admitting: Family Medicine

## 2017-09-24 DIAGNOSIS — I110 Hypertensive heart disease with heart failure: Secondary | ICD-10-CM | POA: Diagnosis not present

## 2017-09-24 DIAGNOSIS — L97429 Non-pressure chronic ulcer of left heel and midfoot with unspecified severity: Secondary | ICD-10-CM | POA: Diagnosis not present

## 2017-09-24 DIAGNOSIS — L89613 Pressure ulcer of right heel, stage 3: Secondary | ICD-10-CM | POA: Insufficient documentation

## 2017-09-24 DIAGNOSIS — I2510 Atherosclerotic heart disease of native coronary artery without angina pectoris: Secondary | ICD-10-CM | POA: Insufficient documentation

## 2017-09-24 DIAGNOSIS — L97822 Non-pressure chronic ulcer of other part of left lower leg with fat layer exposed: Secondary | ICD-10-CM | POA: Diagnosis not present

## 2017-09-24 DIAGNOSIS — G4730 Sleep apnea, unspecified: Secondary | ICD-10-CM | POA: Insufficient documentation

## 2017-09-24 DIAGNOSIS — E1140 Type 2 diabetes mellitus with diabetic neuropathy, unspecified: Secondary | ICD-10-CM | POA: Diagnosis not present

## 2017-09-24 DIAGNOSIS — I5031 Acute diastolic (congestive) heart failure: Secondary | ICD-10-CM | POA: Diagnosis not present

## 2017-09-24 DIAGNOSIS — N312 Flaccid neuropathic bladder, not elsewhere classified: Secondary | ICD-10-CM | POA: Diagnosis not present

## 2017-09-24 DIAGNOSIS — E11622 Type 2 diabetes mellitus with other skin ulcer: Secondary | ICD-10-CM | POA: Diagnosis not present

## 2017-09-24 DIAGNOSIS — L89623 Pressure ulcer of left heel, stage 3: Secondary | ICD-10-CM | POA: Diagnosis not present

## 2017-09-24 DIAGNOSIS — L97419 Non-pressure chronic ulcer of right heel and midfoot with unspecified severity: Secondary | ICD-10-CM | POA: Diagnosis not present

## 2017-09-24 DIAGNOSIS — I252 Old myocardial infarction: Secondary | ICD-10-CM | POA: Insufficient documentation

## 2017-09-24 DIAGNOSIS — S81802A Unspecified open wound, left lower leg, initial encounter: Secondary | ICD-10-CM | POA: Diagnosis not present

## 2017-09-24 NOTE — Telephone Encounter (Signed)
Copied from [place] [id number]. Topic: Inquiry >> Sep 24, 2017 11:19 AM Oliver Pila B wrote: Reason for CRM: wendy hazelwood called and asked is her father is needing to keep the lab appt on the 18th of June, she states that pt was told his A1c is good and she wants to know if this lab appt is for that, call to advise

## 2017-09-25 NOTE — Telephone Encounter (Signed)
Left [name] a message to give clinic a call back.

## 2017-09-25 NOTE — Telephone Encounter (Signed)
[name] [name] returning call [phone]

## 2017-09-25 NOTE — Telephone Encounter (Signed)
Spoke with [name] [name] and confirmed that he had a future BMP that needed to be drawn, so he needed to keep appointment.

## 2017-10-01 DIAGNOSIS — G4730 Sleep apnea, unspecified: Secondary | ICD-10-CM | POA: Diagnosis not present

## 2017-10-01 DIAGNOSIS — I110 Hypertensive heart disease with heart failure: Secondary | ICD-10-CM | POA: Diagnosis not present

## 2017-10-01 DIAGNOSIS — L89623 Pressure ulcer of left heel, stage 3: Secondary | ICD-10-CM | POA: Diagnosis not present

## 2017-10-01 DIAGNOSIS — L97422 Non-pressure chronic ulcer of left heel and midfoot with fat layer exposed: Secondary | ICD-10-CM | POA: Diagnosis not present

## 2017-10-01 DIAGNOSIS — S81802A Unspecified open wound, left lower leg, initial encounter: Secondary | ICD-10-CM | POA: Diagnosis not present

## 2017-10-01 DIAGNOSIS — I5031 Acute diastolic (congestive) heart failure: Secondary | ICD-10-CM | POA: Diagnosis not present

## 2017-10-01 DIAGNOSIS — L89613 Pressure ulcer of right heel, stage 3: Secondary | ICD-10-CM | POA: Diagnosis not present

## 2017-10-01 DIAGNOSIS — E11622 Type 2 diabetes mellitus with other skin ulcer: Secondary | ICD-10-CM | POA: Diagnosis not present

## 2017-10-01 DIAGNOSIS — I2510 Atherosclerotic heart disease of native coronary artery without angina pectoris: Secondary | ICD-10-CM | POA: Diagnosis not present

## 2017-10-01 DIAGNOSIS — E1140 Type 2 diabetes mellitus with diabetic neuropathy, unspecified: Secondary | ICD-10-CM | POA: Diagnosis not present

## 2017-10-01 DIAGNOSIS — L97822 Non-pressure chronic ulcer of other part of left lower leg with fat layer exposed: Secondary | ICD-10-CM | POA: Diagnosis not present

## 2017-10-08 ENCOUNTER — Other Ambulatory Visit (HOSPITAL_COMMUNITY): Payer: Self-pay | Admitting: Internal Medicine

## 2017-10-08 ENCOUNTER — Other Ambulatory Visit (INDEPENDENT_AMBULATORY_CARE_PROVIDER_SITE_OTHER): Payer: Medicare HMO

## 2017-10-08 DIAGNOSIS — N183 Chronic kidney disease, stage 3 unspecified: Secondary | ICD-10-CM

## 2017-10-08 LAB — BASIC METABOLIC PANEL
BUN: 39 mg/dL — AB (ref 6–23)
CO2: 28 mEq/L (ref 19–32)
Calcium: 9 mg/dL (ref 8.4–10.5)
Chloride: 109 mEq/L (ref 96–112)
Creatinine, Ser: 2.18 mg/dL — ABNORMAL HIGH (ref 0.40–1.50)
GFR: 31.83 mL/min — AB (ref >60.00)
GLUCOSE: 196 mg/dL — AB (ref 70–99)
Potassium: 4.5 mEq/L (ref 3.5–5.1)
Sodium: 145 mEq/L (ref 135–145)

## 2017-10-10 DIAGNOSIS — G4730 Sleep apnea, unspecified: Secondary | ICD-10-CM | POA: Diagnosis not present

## 2017-10-10 DIAGNOSIS — E11622 Type 2 diabetes mellitus with other skin ulcer: Secondary | ICD-10-CM | POA: Diagnosis not present

## 2017-10-10 DIAGNOSIS — I5031 Acute diastolic (congestive) heart failure: Secondary | ICD-10-CM | POA: Diagnosis not present

## 2017-10-10 DIAGNOSIS — L89623 Pressure ulcer of left heel, stage 3: Secondary | ICD-10-CM | POA: Diagnosis not present

## 2017-10-10 DIAGNOSIS — L97822 Non-pressure chronic ulcer of other part of left lower leg with fat layer exposed: Secondary | ICD-10-CM | POA: Diagnosis not present

## 2017-10-10 DIAGNOSIS — L97222 Non-pressure chronic ulcer of left calf with fat layer exposed: Secondary | ICD-10-CM | POA: Diagnosis not present

## 2017-10-10 DIAGNOSIS — I2510 Atherosclerotic heart disease of native coronary artery without angina pectoris: Secondary | ICD-10-CM | POA: Diagnosis not present

## 2017-10-10 DIAGNOSIS — I110 Hypertensive heart disease with heart failure: Secondary | ICD-10-CM | POA: Diagnosis not present

## 2017-10-10 DIAGNOSIS — L89613 Pressure ulcer of right heel, stage 3: Secondary | ICD-10-CM | POA: Diagnosis not present

## 2017-10-10 DIAGNOSIS — E1140 Type 2 diabetes mellitus with diabetic neuropathy, unspecified: Secondary | ICD-10-CM | POA: Diagnosis not present

## 2017-10-14 ENCOUNTER — Other Ambulatory Visit (HOSPITAL_COMMUNITY): Payer: Self-pay | Admitting: *Deleted

## 2017-10-14 DIAGNOSIS — I130 Hypertensive heart and chronic kidney disease with heart failure and stage 1 through stage 4 chronic kidney disease, or unspecified chronic kidney disease: Secondary | ICD-10-CM

## 2017-10-14 MED ORDER — LOSARTAN POTASSIUM 25 MG PO TABS: 12.5000 mg | ORAL_TABLET | Freq: Two times a day (BID) | ORAL | 2 refills | Status: DC

## 2017-10-16 ENCOUNTER — Other Ambulatory Visit: Payer: Self-pay | Admitting: Family Medicine

## 2017-10-16 DIAGNOSIS — N183 Chronic kidney disease, stage 3 unspecified: Secondary | ICD-10-CM

## 2017-10-17 DIAGNOSIS — I110 Hypertensive heart disease with heart failure: Secondary | ICD-10-CM | POA: Diagnosis not present

## 2017-10-17 DIAGNOSIS — I2510 Atherosclerotic heart disease of native coronary artery without angina pectoris: Secondary | ICD-10-CM | POA: Diagnosis not present

## 2017-10-17 DIAGNOSIS — L97222 Non-pressure chronic ulcer of left calf with fat layer exposed: Secondary | ICD-10-CM | POA: Diagnosis not present

## 2017-10-17 DIAGNOSIS — I5031 Acute diastolic (congestive) heart failure: Secondary | ICD-10-CM | POA: Diagnosis not present

## 2017-10-17 DIAGNOSIS — E1140 Type 2 diabetes mellitus with diabetic neuropathy, unspecified: Secondary | ICD-10-CM | POA: Diagnosis not present

## 2017-10-17 DIAGNOSIS — L89623 Pressure ulcer of left heel, stage 3: Secondary | ICD-10-CM | POA: Diagnosis not present

## 2017-10-17 DIAGNOSIS — L89899 Pressure ulcer of other site, unspecified stage: Secondary | ICD-10-CM | POA: Diagnosis not present

## 2017-10-17 DIAGNOSIS — G4730 Sleep apnea, unspecified: Secondary | ICD-10-CM | POA: Diagnosis not present

## 2017-10-17 DIAGNOSIS — L97822 Non-pressure chronic ulcer of other part of left lower leg with fat layer exposed: Secondary | ICD-10-CM | POA: Diagnosis not present

## 2017-10-17 DIAGNOSIS — E11622 Type 2 diabetes mellitus with other skin ulcer: Secondary | ICD-10-CM | POA: Diagnosis not present

## 2017-10-17 DIAGNOSIS — L89613 Pressure ulcer of right heel, stage 3: Secondary | ICD-10-CM | POA: Diagnosis not present

## 2017-10-22 ENCOUNTER — Encounter (HOSPITAL_BASED_OUTPATIENT_CLINIC_OR_DEPARTMENT_OTHER): Payer: Medicare HMO | Attending: Internal Medicine

## 2017-10-22 DIAGNOSIS — I25119 Atherosclerotic heart disease of native coronary artery with unspecified angina pectoris: Secondary | ICD-10-CM | POA: Insufficient documentation

## 2017-10-22 DIAGNOSIS — I252 Old myocardial infarction: Secondary | ICD-10-CM | POA: Insufficient documentation

## 2017-10-22 DIAGNOSIS — I110 Hypertensive heart disease with heart failure: Secondary | ICD-10-CM | POA: Diagnosis not present

## 2017-10-22 DIAGNOSIS — L97822 Non-pressure chronic ulcer of other part of left lower leg with fat layer exposed: Secondary | ICD-10-CM | POA: Diagnosis not present

## 2017-10-22 DIAGNOSIS — I2510 Atherosclerotic heart disease of native coronary artery without angina pectoris: Secondary | ICD-10-CM | POA: Diagnosis not present

## 2017-10-22 DIAGNOSIS — E11622 Type 2 diabetes mellitus with other skin ulcer: Secondary | ICD-10-CM | POA: Diagnosis not present

## 2017-10-22 DIAGNOSIS — W548XXA Other contact with dog, initial encounter: Secondary | ICD-10-CM | POA: Insufficient documentation

## 2017-10-22 DIAGNOSIS — I509 Heart failure, unspecified: Secondary | ICD-10-CM | POA: Insufficient documentation

## 2017-10-22 DIAGNOSIS — E1140 Type 2 diabetes mellitus with diabetic neuropathy, unspecified: Secondary | ICD-10-CM | POA: Diagnosis not present

## 2017-10-22 DIAGNOSIS — E11621 Type 2 diabetes mellitus with foot ulcer: Secondary | ICD-10-CM | POA: Diagnosis not present

## 2017-10-22 DIAGNOSIS — N312 Flaccid neuropathic bladder, not elsewhere classified: Secondary | ICD-10-CM | POA: Diagnosis not present

## 2017-10-22 DIAGNOSIS — L97222 Non-pressure chronic ulcer of left calf with fat layer exposed: Secondary | ICD-10-CM | POA: Diagnosis not present

## 2017-10-22 DIAGNOSIS — S51811A Laceration without foreign body of right forearm, initial encounter: Secondary | ICD-10-CM | POA: Diagnosis not present

## 2017-10-26 ENCOUNTER — Other Ambulatory Visit: Payer: Self-pay | Admitting: Family Medicine

## 2017-11-05 DIAGNOSIS — L97222 Non-pressure chronic ulcer of left calf with fat layer exposed: Secondary | ICD-10-CM | POA: Diagnosis not present

## 2017-11-05 DIAGNOSIS — I509 Heart failure, unspecified: Secondary | ICD-10-CM | POA: Diagnosis not present

## 2017-11-05 DIAGNOSIS — E11621 Type 2 diabetes mellitus with foot ulcer: Secondary | ICD-10-CM | POA: Diagnosis not present

## 2017-11-05 DIAGNOSIS — E1140 Type 2 diabetes mellitus with diabetic neuropathy, unspecified: Secondary | ICD-10-CM | POA: Diagnosis not present

## 2017-11-05 DIAGNOSIS — I110 Hypertensive heart disease with heart failure: Secondary | ICD-10-CM | POA: Diagnosis not present

## 2017-11-05 DIAGNOSIS — I2510 Atherosclerotic heart disease of native coronary artery without angina pectoris: Secondary | ICD-10-CM | POA: Diagnosis not present

## 2017-11-05 DIAGNOSIS — I252 Old myocardial infarction: Secondary | ICD-10-CM | POA: Diagnosis not present

## 2017-11-05 DIAGNOSIS — S51811A Laceration without foreign body of right forearm, initial encounter: Secondary | ICD-10-CM | POA: Diagnosis not present

## 2017-11-05 DIAGNOSIS — L97822 Non-pressure chronic ulcer of other part of left lower leg with fat layer exposed: Secondary | ICD-10-CM | POA: Diagnosis not present

## 2017-11-05 DIAGNOSIS — I25119 Atherosclerotic heart disease of native coronary artery with unspecified angina pectoris: Secondary | ICD-10-CM | POA: Diagnosis not present

## 2017-11-19 DIAGNOSIS — E1140 Type 2 diabetes mellitus with diabetic neuropathy, unspecified: Secondary | ICD-10-CM | POA: Diagnosis not present

## 2017-11-19 DIAGNOSIS — L97822 Non-pressure chronic ulcer of other part of left lower leg with fat layer exposed: Secondary | ICD-10-CM | POA: Diagnosis not present

## 2017-11-19 DIAGNOSIS — I2510 Atherosclerotic heart disease of native coronary artery without angina pectoris: Secondary | ICD-10-CM | POA: Diagnosis not present

## 2017-11-19 DIAGNOSIS — E11621 Type 2 diabetes mellitus with foot ulcer: Secondary | ICD-10-CM | POA: Diagnosis not present

## 2017-11-19 DIAGNOSIS — S81802A Unspecified open wound, left lower leg, initial encounter: Secondary | ICD-10-CM | POA: Diagnosis not present

## 2017-11-19 DIAGNOSIS — I252 Old myocardial infarction: Secondary | ICD-10-CM | POA: Diagnosis not present

## 2017-11-19 DIAGNOSIS — S51811A Laceration without foreign body of right forearm, initial encounter: Secondary | ICD-10-CM | POA: Diagnosis not present

## 2017-11-19 DIAGNOSIS — L03116 Cellulitis of left lower limb: Secondary | ICD-10-CM | POA: Diagnosis not present

## 2017-11-19 DIAGNOSIS — I110 Hypertensive heart disease with heart failure: Secondary | ICD-10-CM | POA: Diagnosis not present

## 2017-11-19 DIAGNOSIS — I509 Heart failure, unspecified: Secondary | ICD-10-CM | POA: Diagnosis not present

## 2017-11-19 DIAGNOSIS — I25119 Atherosclerotic heart disease of native coronary artery with unspecified angina pectoris: Secondary | ICD-10-CM | POA: Diagnosis not present

## 2017-11-21 ENCOUNTER — Encounter: Payer: Medicare HMO | Admitting: *Deleted

## 2017-11-21 ENCOUNTER — Telehealth: Payer: Self-pay

## 2017-11-21 NOTE — Telephone Encounter (Signed)
I spoke with the patient about this.

## 2017-11-22 ENCOUNTER — Other Ambulatory Visit (HOSPITAL_COMMUNITY): Payer: Self-pay | Admitting: Family Medicine

## 2017-11-26 DIAGNOSIS — N312 Flaccid neuropathic bladder, not elsewhere classified: Secondary | ICD-10-CM | POA: Diagnosis not present

## 2017-12-02 ENCOUNTER — Other Ambulatory Visit: Payer: Self-pay | Admitting: *Deleted

## 2017-12-02 ENCOUNTER — Encounter (HOSPITAL_BASED_OUTPATIENT_CLINIC_OR_DEPARTMENT_OTHER): Payer: Medicare HMO | Attending: Internal Medicine

## 2017-12-02 DIAGNOSIS — I509 Heart failure, unspecified: Secondary | ICD-10-CM | POA: Diagnosis not present

## 2017-12-02 DIAGNOSIS — Z794 Long term (current) use of insulin: Secondary | ICD-10-CM | POA: Diagnosis not present

## 2017-12-02 DIAGNOSIS — L97822 Non-pressure chronic ulcer of other part of left lower leg with fat layer exposed: Secondary | ICD-10-CM | POA: Diagnosis not present

## 2017-12-02 DIAGNOSIS — I2510 Atherosclerotic heart disease of native coronary artery without angina pectoris: Secondary | ICD-10-CM | POA: Diagnosis not present

## 2017-12-02 DIAGNOSIS — E11622 Type 2 diabetes mellitus with other skin ulcer: Secondary | ICD-10-CM | POA: Insufficient documentation

## 2017-12-02 DIAGNOSIS — E1140 Type 2 diabetes mellitus with diabetic neuropathy, unspecified: Secondary | ICD-10-CM | POA: Diagnosis not present

## 2017-12-02 DIAGNOSIS — I110 Hypertensive heart disease with heart failure: Secondary | ICD-10-CM | POA: Diagnosis not present

## 2017-12-02 DIAGNOSIS — I252 Old myocardial infarction: Secondary | ICD-10-CM | POA: Diagnosis not present

## 2017-12-02 DIAGNOSIS — G4730 Sleep apnea, unspecified: Secondary | ICD-10-CM | POA: Insufficient documentation

## 2017-12-02 DIAGNOSIS — L97222 Non-pressure chronic ulcer of left calf with fat layer exposed: Secondary | ICD-10-CM | POA: Diagnosis not present

## 2017-12-02 MED ORDER — TRUEPLUS LANCETS 30G MISC: 5 refills | Status: DC

## 2017-12-02 MED ORDER — TRUE METRIX AIR GLUCOSE METER W/DEVICE KIT: 1.0000 | PACK | Freq: Every day | 1 refills | Status: AC

## 2017-12-02 MED ORDER — GLUCOSE BLOOD VI STRP: ORAL_STRIP | 12 refills | Status: DC

## 2017-12-04 ENCOUNTER — Other Ambulatory Visit: Payer: Self-pay | Admitting: *Deleted

## 2017-12-04 DIAGNOSIS — I130 Hypertensive heart and chronic kidney disease with heart failure and stage 1 through stage 4 chronic kidney disease, or unspecified chronic kidney disease: Secondary | ICD-10-CM

## 2017-12-04 MED ORDER — TAMSULOSIN HCL 0.4 MG PO CAPS: 0.4000 mg | ORAL_CAPSULE | Freq: Every day | ORAL | 3 refills | Status: DC

## 2017-12-04 MED ORDER — TRUE METRIX LEVEL 1 LOW VI SOLN: 3 refills | Status: AC

## 2017-12-04 MED ORDER — CARVEDILOL 3.125 MG PO TABS: 3.1250 mg | ORAL_TABLET | Freq: Two times a day (BID) | ORAL | 2 refills | Status: DC

## 2017-12-04 MED ORDER — CLOPIDOGREL BISULFATE 75 MG PO TABS: 75.0000 mg | ORAL_TABLET | Freq: Every day | ORAL | 3 refills | Status: DC

## 2017-12-04 MED ORDER — TORSEMIDE 20 MG PO TABS: ORAL_TABLET | ORAL | 3 refills | Status: DC

## 2017-12-04 MED ORDER — FINASTERIDE 5 MG PO TABS: 5.0000 mg | ORAL_TABLET | Freq: Every day | ORAL | 2 refills | Status: DC

## 2017-12-04 MED ORDER — MIRTAZAPINE 15 MG PO TABS: ORAL_TABLET | ORAL | 1 refills | Status: DC

## 2017-12-04 MED ORDER — AMIODARONE HCL 200 MG PO TABS: ORAL_TABLET | ORAL | 3 refills | Status: DC

## 2017-12-04 MED ORDER — BD SWAB SINGLE USE REGULAR PADS: MEDICATED_PAD | 3 refills | Status: DC

## 2017-12-04 MED ORDER — ROSUVASTATIN CALCIUM 40 MG PO TABS: 40.0000 mg | ORAL_TABLET | Freq: Every day | ORAL | 3 refills | Status: DC

## 2017-12-04 MED ORDER — LOSARTAN POTASSIUM 25 MG PO TABS: 12.5000 mg | ORAL_TABLET | Freq: Two times a day (BID) | ORAL | 2 refills | Status: DC

## 2017-12-05 ENCOUNTER — Ambulatory Visit: Payer: Medicare HMO

## 2017-12-06 ENCOUNTER — Ambulatory Visit: Payer: Medicare HMO

## 2017-12-06 DIAGNOSIS — N189 Chronic kidney disease, unspecified: Secondary | ICD-10-CM | POA: Diagnosis not present

## 2017-12-06 DIAGNOSIS — I129 Hypertensive chronic kidney disease with stage 1 through stage 4 chronic kidney disease, or unspecified chronic kidney disease: Secondary | ICD-10-CM | POA: Diagnosis not present

## 2017-12-06 DIAGNOSIS — I509 Heart failure, unspecified: Secondary | ICD-10-CM | POA: Diagnosis not present

## 2017-12-10 ENCOUNTER — Telehealth (HOSPITAL_COMMUNITY): Payer: Self-pay | Admitting: Vascular Surgery

## 2017-12-10 NOTE — Telephone Encounter (Signed)
Pt message , stating DB will not be in office 9/5 morning and I needed to get pt rescheduled for later date

## 2017-12-16 DIAGNOSIS — L97222 Non-pressure chronic ulcer of left calf with fat layer exposed: Secondary | ICD-10-CM | POA: Diagnosis not present

## 2017-12-16 DIAGNOSIS — E1140 Type 2 diabetes mellitus with diabetic neuropathy, unspecified: Secondary | ICD-10-CM | POA: Diagnosis not present

## 2017-12-16 DIAGNOSIS — I252 Old myocardial infarction: Secondary | ICD-10-CM | POA: Diagnosis not present

## 2017-12-16 DIAGNOSIS — I110 Hypertensive heart disease with heart failure: Secondary | ICD-10-CM | POA: Diagnosis not present

## 2017-12-16 DIAGNOSIS — I509 Heart failure, unspecified: Secondary | ICD-10-CM | POA: Diagnosis not present

## 2017-12-16 DIAGNOSIS — I2510 Atherosclerotic heart disease of native coronary artery without angina pectoris: Secondary | ICD-10-CM | POA: Diagnosis not present

## 2017-12-16 DIAGNOSIS — G4730 Sleep apnea, unspecified: Secondary | ICD-10-CM | POA: Diagnosis not present

## 2017-12-16 DIAGNOSIS — L97822 Non-pressure chronic ulcer of other part of left lower leg with fat layer exposed: Secondary | ICD-10-CM | POA: Diagnosis not present

## 2017-12-16 DIAGNOSIS — E11622 Type 2 diabetes mellitus with other skin ulcer: Secondary | ICD-10-CM | POA: Diagnosis not present

## 2017-12-16 DIAGNOSIS — L539 Erythematous condition, unspecified: Secondary | ICD-10-CM | POA: Diagnosis not present

## 2017-12-16 DIAGNOSIS — Z794 Long term (current) use of insulin: Secondary | ICD-10-CM | POA: Diagnosis not present

## 2017-12-19 ENCOUNTER — Other Ambulatory Visit (HOSPITAL_COMMUNITY): Payer: Medicare HMO

## 2017-12-24 ENCOUNTER — Encounter (HOSPITAL_BASED_OUTPATIENT_CLINIC_OR_DEPARTMENT_OTHER): Payer: Medicare HMO | Attending: Internal Medicine

## 2017-12-24 DIAGNOSIS — N312 Flaccid neuropathic bladder, not elsewhere classified: Secondary | ICD-10-CM | POA: Diagnosis not present

## 2017-12-24 DIAGNOSIS — L03116 Cellulitis of left lower limb: Secondary | ICD-10-CM | POA: Insufficient documentation

## 2017-12-24 DIAGNOSIS — L97822 Non-pressure chronic ulcer of other part of left lower leg with fat layer exposed: Secondary | ICD-10-CM | POA: Diagnosis not present

## 2017-12-24 DIAGNOSIS — I252 Old myocardial infarction: Secondary | ICD-10-CM | POA: Diagnosis not present

## 2017-12-24 DIAGNOSIS — I110 Hypertensive heart disease with heart failure: Secondary | ICD-10-CM | POA: Insufficient documentation

## 2017-12-24 DIAGNOSIS — G4730 Sleep apnea, unspecified: Secondary | ICD-10-CM | POA: Insufficient documentation

## 2017-12-24 DIAGNOSIS — E1140 Type 2 diabetes mellitus with diabetic neuropathy, unspecified: Secondary | ICD-10-CM | POA: Diagnosis not present

## 2017-12-24 DIAGNOSIS — L97222 Non-pressure chronic ulcer of left calf with fat layer exposed: Secondary | ICD-10-CM | POA: Diagnosis not present

## 2017-12-24 DIAGNOSIS — I25119 Atherosclerotic heart disease of native coronary artery with unspecified angina pectoris: Secondary | ICD-10-CM | POA: Diagnosis not present

## 2017-12-24 DIAGNOSIS — I5031 Acute diastolic (congestive) heart failure: Secondary | ICD-10-CM | POA: Diagnosis not present

## 2017-12-26 ENCOUNTER — Encounter (HOSPITAL_COMMUNITY): Payer: Medicare HMO | Admitting: Internal Medicine

## 2017-12-30 DIAGNOSIS — I25119 Atherosclerotic heart disease of native coronary artery with unspecified angina pectoris: Secondary | ICD-10-CM | POA: Diagnosis not present

## 2017-12-30 DIAGNOSIS — L03116 Cellulitis of left lower limb: Secondary | ICD-10-CM | POA: Diagnosis not present

## 2017-12-30 DIAGNOSIS — I110 Hypertensive heart disease with heart failure: Secondary | ICD-10-CM | POA: Diagnosis not present

## 2017-12-30 DIAGNOSIS — E11622 Type 2 diabetes mellitus with other skin ulcer: Secondary | ICD-10-CM | POA: Diagnosis not present

## 2017-12-30 DIAGNOSIS — I252 Old myocardial infarction: Secondary | ICD-10-CM | POA: Diagnosis not present

## 2017-12-30 DIAGNOSIS — I5031 Acute diastolic (congestive) heart failure: Secondary | ICD-10-CM | POA: Diagnosis not present

## 2017-12-30 DIAGNOSIS — E1140 Type 2 diabetes mellitus with diabetic neuropathy, unspecified: Secondary | ICD-10-CM | POA: Diagnosis not present

## 2017-12-30 DIAGNOSIS — L97822 Non-pressure chronic ulcer of other part of left lower leg with fat layer exposed: Secondary | ICD-10-CM | POA: Diagnosis not present

## 2017-12-30 DIAGNOSIS — L97222 Non-pressure chronic ulcer of left calf with fat layer exposed: Secondary | ICD-10-CM | POA: Diagnosis not present

## 2017-12-30 DIAGNOSIS — G4730 Sleep apnea, unspecified: Secondary | ICD-10-CM | POA: Diagnosis not present

## 2018-01-07 DIAGNOSIS — I25119 Atherosclerotic heart disease of native coronary artery with unspecified angina pectoris: Secondary | ICD-10-CM | POA: Diagnosis not present

## 2018-01-07 DIAGNOSIS — L97222 Non-pressure chronic ulcer of left calf with fat layer exposed: Secondary | ICD-10-CM | POA: Diagnosis not present

## 2018-01-07 DIAGNOSIS — I5031 Acute diastolic (congestive) heart failure: Secondary | ICD-10-CM | POA: Diagnosis not present

## 2018-01-07 DIAGNOSIS — G4730 Sleep apnea, unspecified: Secondary | ICD-10-CM | POA: Diagnosis not present

## 2018-01-07 DIAGNOSIS — L03116 Cellulitis of left lower limb: Secondary | ICD-10-CM | POA: Diagnosis not present

## 2018-01-07 DIAGNOSIS — I110 Hypertensive heart disease with heart failure: Secondary | ICD-10-CM | POA: Diagnosis not present

## 2018-01-07 DIAGNOSIS — L97822 Non-pressure chronic ulcer of other part of left lower leg with fat layer exposed: Secondary | ICD-10-CM | POA: Diagnosis not present

## 2018-01-07 DIAGNOSIS — E11622 Type 2 diabetes mellitus with other skin ulcer: Secondary | ICD-10-CM | POA: Diagnosis not present

## 2018-01-07 DIAGNOSIS — E1140 Type 2 diabetes mellitus with diabetic neuropathy, unspecified: Secondary | ICD-10-CM | POA: Diagnosis not present

## 2018-01-07 DIAGNOSIS — I252 Old myocardial infarction: Secondary | ICD-10-CM | POA: Diagnosis not present

## 2018-01-13 DIAGNOSIS — L97822 Non-pressure chronic ulcer of other part of left lower leg with fat layer exposed: Secondary | ICD-10-CM | POA: Diagnosis not present

## 2018-01-13 DIAGNOSIS — G4730 Sleep apnea, unspecified: Secondary | ICD-10-CM | POA: Diagnosis not present

## 2018-01-13 DIAGNOSIS — I25119 Atherosclerotic heart disease of native coronary artery with unspecified angina pectoris: Secondary | ICD-10-CM | POA: Diagnosis not present

## 2018-01-13 DIAGNOSIS — L03116 Cellulitis of left lower limb: Secondary | ICD-10-CM | POA: Diagnosis not present

## 2018-01-13 DIAGNOSIS — I110 Hypertensive heart disease with heart failure: Secondary | ICD-10-CM | POA: Diagnosis not present

## 2018-01-13 DIAGNOSIS — I252 Old myocardial infarction: Secondary | ICD-10-CM | POA: Diagnosis not present

## 2018-01-13 DIAGNOSIS — S81802A Unspecified open wound, left lower leg, initial encounter: Secondary | ICD-10-CM | POA: Diagnosis not present

## 2018-01-13 DIAGNOSIS — E1140 Type 2 diabetes mellitus with diabetic neuropathy, unspecified: Secondary | ICD-10-CM | POA: Diagnosis not present

## 2018-01-13 DIAGNOSIS — I5031 Acute diastolic (congestive) heart failure: Secondary | ICD-10-CM | POA: Diagnosis not present

## 2018-01-20 ENCOUNTER — Other Ambulatory Visit: Payer: Self-pay | Admitting: Family Medicine

## 2018-01-21 ENCOUNTER — Encounter (HOSPITAL_BASED_OUTPATIENT_CLINIC_OR_DEPARTMENT_OTHER): Payer: Medicare HMO | Attending: Internal Medicine

## 2018-01-21 DIAGNOSIS — G4730 Sleep apnea, unspecified: Secondary | ICD-10-CM | POA: Insufficient documentation

## 2018-01-21 DIAGNOSIS — I110 Hypertensive heart disease with heart failure: Secondary | ICD-10-CM | POA: Insufficient documentation

## 2018-01-21 DIAGNOSIS — E1140 Type 2 diabetes mellitus with diabetic neuropathy, unspecified: Secondary | ICD-10-CM | POA: Insufficient documentation

## 2018-01-21 DIAGNOSIS — I252 Old myocardial infarction: Secondary | ICD-10-CM | POA: Insufficient documentation

## 2018-01-21 DIAGNOSIS — L03116 Cellulitis of left lower limb: Secondary | ICD-10-CM | POA: Diagnosis not present

## 2018-01-21 DIAGNOSIS — I5031 Acute diastolic (congestive) heart failure: Secondary | ICD-10-CM | POA: Insufficient documentation

## 2018-01-21 DIAGNOSIS — N312 Flaccid neuropathic bladder, not elsewhere classified: Secondary | ICD-10-CM | POA: Diagnosis not present

## 2018-01-21 DIAGNOSIS — I25119 Atherosclerotic heart disease of native coronary artery with unspecified angina pectoris: Secondary | ICD-10-CM | POA: Insufficient documentation

## 2018-01-21 DIAGNOSIS — L97822 Non-pressure chronic ulcer of other part of left lower leg with fat layer exposed: Secondary | ICD-10-CM | POA: Insufficient documentation

## 2018-01-21 DIAGNOSIS — L97222 Non-pressure chronic ulcer of left calf with fat layer exposed: Secondary | ICD-10-CM | POA: Diagnosis not present

## 2018-01-28 DIAGNOSIS — I252 Old myocardial infarction: Secondary | ICD-10-CM | POA: Diagnosis not present

## 2018-01-28 DIAGNOSIS — I110 Hypertensive heart disease with heart failure: Secondary | ICD-10-CM | POA: Diagnosis not present

## 2018-01-28 DIAGNOSIS — L03116 Cellulitis of left lower limb: Secondary | ICD-10-CM | POA: Diagnosis not present

## 2018-01-28 DIAGNOSIS — L97222 Non-pressure chronic ulcer of left calf with fat layer exposed: Secondary | ICD-10-CM | POA: Diagnosis not present

## 2018-01-28 DIAGNOSIS — I25119 Atherosclerotic heart disease of native coronary artery with unspecified angina pectoris: Secondary | ICD-10-CM | POA: Diagnosis not present

## 2018-01-28 DIAGNOSIS — I5031 Acute diastolic (congestive) heart failure: Secondary | ICD-10-CM | POA: Diagnosis not present

## 2018-01-28 DIAGNOSIS — E11622 Type 2 diabetes mellitus with other skin ulcer: Secondary | ICD-10-CM | POA: Diagnosis not present

## 2018-01-28 DIAGNOSIS — G4730 Sleep apnea, unspecified: Secondary | ICD-10-CM | POA: Diagnosis not present

## 2018-01-28 DIAGNOSIS — E1140 Type 2 diabetes mellitus with diabetic neuropathy, unspecified: Secondary | ICD-10-CM | POA: Diagnosis not present

## 2018-01-28 DIAGNOSIS — L97822 Non-pressure chronic ulcer of other part of left lower leg with fat layer exposed: Secondary | ICD-10-CM | POA: Diagnosis not present

## 2018-01-30 ENCOUNTER — Ambulatory Visit (HOSPITAL_COMMUNITY)
Admission: RE | Admit: 2018-01-30 | Discharge: 2018-01-30 | Disposition: A | Discharge status: Home / Self Care | Payer: Medicare HMO | Source: Ambulatory Visit | Attending: Internal Medicine | Admitting: Internal Medicine

## 2018-01-30 ENCOUNTER — Encounter (HOSPITAL_COMMUNITY): Payer: Self-pay | Admitting: Internal Medicine

## 2018-01-30 ENCOUNTER — Other Ambulatory Visit: Payer: Self-pay

## 2018-01-30 VITALS — BP 98/64 | HR 76

## 2018-01-30 DIAGNOSIS — E1122 Type 2 diabetes mellitus with diabetic chronic kidney disease: Secondary | ICD-10-CM | POA: Insufficient documentation

## 2018-01-30 DIAGNOSIS — N319 Neuromuscular dysfunction of bladder, unspecified: Secondary | ICD-10-CM | POA: Insufficient documentation

## 2018-01-30 DIAGNOSIS — I472 Ventricular tachycardia, unspecified: Secondary | ICD-10-CM

## 2018-01-30 DIAGNOSIS — G839 Paralytic syndrome, unspecified: Secondary | ICD-10-CM | POA: Insufficient documentation

## 2018-01-30 DIAGNOSIS — R972 Elevated prostate specific antigen [PSA]: Secondary | ICD-10-CM | POA: Insufficient documentation

## 2018-01-30 DIAGNOSIS — I252 Old myocardial infarction: Secondary | ICD-10-CM | POA: Insufficient documentation

## 2018-01-30 DIAGNOSIS — I2510 Atherosclerotic heart disease of native coronary artery without angina pectoris: Secondary | ICD-10-CM | POA: Diagnosis not present

## 2018-01-30 DIAGNOSIS — G4730 Sleep apnea, unspecified: Secondary | ICD-10-CM | POA: Insufficient documentation

## 2018-01-30 DIAGNOSIS — I25810 Atherosclerosis of coronary artery bypass graft(s) without angina pectoris: Secondary | ICD-10-CM

## 2018-01-30 DIAGNOSIS — Z96641 Presence of right artificial hip joint: Secondary | ICD-10-CM | POA: Diagnosis not present

## 2018-01-30 DIAGNOSIS — Z95810 Presence of automatic (implantable) cardiac defibrillator: Secondary | ICD-10-CM | POA: Insufficient documentation

## 2018-01-30 DIAGNOSIS — Z8673 Personal history of transient ischemic attack (TIA), and cerebral infarction without residual deficits: Secondary | ICD-10-CM | POA: Diagnosis not present

## 2018-01-30 DIAGNOSIS — I255 Ischemic cardiomyopathy: Secondary | ICD-10-CM | POA: Insufficient documentation

## 2018-01-30 DIAGNOSIS — Z7982 Long term (current) use of aspirin: Secondary | ICD-10-CM | POA: Diagnosis not present

## 2018-01-30 DIAGNOSIS — N320 Bladder-neck obstruction: Secondary | ICD-10-CM

## 2018-01-30 DIAGNOSIS — I130 Hypertensive heart and chronic kidney disease with heart failure and stage 1 through stage 4 chronic kidney disease, or unspecified chronic kidney disease: Secondary | ICD-10-CM | POA: Diagnosis not present

## 2018-01-30 DIAGNOSIS — Z993 Dependence on wheelchair: Secondary | ICD-10-CM | POA: Insufficient documentation

## 2018-01-30 DIAGNOSIS — Z955 Presence of coronary angioplasty implant and graft: Secondary | ICD-10-CM | POA: Diagnosis not present

## 2018-01-30 DIAGNOSIS — Z882 Allergy status to sulfonamides status: Secondary | ICD-10-CM | POA: Diagnosis not present

## 2018-01-30 DIAGNOSIS — I5042 Chronic combined systolic (congestive) and diastolic (congestive) heart failure: Secondary | ICD-10-CM

## 2018-01-30 DIAGNOSIS — Z7902 Long term (current) use of antithrombotics/antiplatelets: Secondary | ICD-10-CM | POA: Diagnosis not present

## 2018-01-30 DIAGNOSIS — Z794 Long term (current) use of insulin: Secondary | ICD-10-CM | POA: Insufficient documentation

## 2018-01-30 DIAGNOSIS — N184 Chronic kidney disease, stage 4 (severe): Secondary | ICD-10-CM | POA: Diagnosis not present

## 2018-01-30 DIAGNOSIS — E1140 Type 2 diabetes mellitus with diabetic neuropathy, unspecified: Secondary | ICD-10-CM | POA: Diagnosis not present

## 2018-01-30 DIAGNOSIS — M109 Gout, unspecified: Secondary | ICD-10-CM | POA: Insufficient documentation

## 2018-01-30 DIAGNOSIS — I440 Atrioventricular block, first degree: Secondary | ICD-10-CM | POA: Insufficient documentation

## 2018-01-30 DIAGNOSIS — N183 Chronic kidney disease, stage 3 unspecified: Secondary | ICD-10-CM

## 2018-01-30 LAB — COMPREHENSIVE METABOLIC PANEL
ALBUMIN: 3.2 g/dL — AB (ref 3.5–5.0)
ALT: 31 U/L (ref 0–44)
ANION GAP: 9 (ref 5–15)
AST: 39 U/L (ref 15–41)
Alkaline Phosphatase: 123 U/L (ref 38–126)
BUN: 24 mg/dL — ABNORMAL HIGH (ref 8–23)
CO2: 25 mmol/L (ref 22–32)
Calcium: 8.7 mg/dL — ABNORMAL LOW (ref 8.9–10.3)
Chloride: 109 mmol/L (ref 98–111)
Creatinine, Ser: 2.02 mg/dL — ABNORMAL HIGH (ref 0.61–1.24)
GFR calc non Af Amer: 31 mL/min — ABNORMAL LOW (ref >60)
GFR, EST AFRICAN AMERICAN: 36 mL/min — AB (ref >60)
GLUCOSE: 312 mg/dL — AB (ref 70–99)
POTASSIUM: 4.5 mmol/L (ref 3.5–5.1)
SODIUM: 143 mmol/L (ref 135–145)
TOTAL PROTEIN: 6.5 g/dL (ref 6.5–8.1)
Total Bilirubin: 0.6 mg/dL (ref 0.3–1.2)

## 2018-01-30 LAB — TSH: TSH: 1.755 u[IU]/mL (ref 0.350–4.500)

## 2018-01-30 LAB — T4, FREE: Free T4: 1.11 ng/dL (ref 0.82–1.77)
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
## 2018-01-30 NOTE — Patient Instructions (Signed)
It was great to see you today! No medication changes are needed at this time.  Labs today We will only contact you if something comes back abnormal or we need to make some changes. Otherwise no news is good news!  Your physician recommends that you schedule a follow-up appointment in: 6 months with Dr Haroldine Laws (please call our office in April for a follow up in May 2020)  Do the following things EVERYDAY: 1) Weigh yourself in the morning before breakfast. Write it down and keep it in a log. 2) Take your medicines as prescribed 3) Eat low salt foods-Limit salt (sodium) to 2000 mg per day.  4) Stay as active as you can everyday 5) Limit all fluids for the day to less than 2 liters

## 2018-01-31 LAB — T3, FREE: T3, Free: 2.1 pg/mL (ref 2.0–4.4)

## 2018-02-11 ENCOUNTER — Ambulatory Visit (INDEPENDENT_AMBULATORY_CARE_PROVIDER_SITE_OTHER): Payer: Medicare HMO | Admitting: Family Medicine

## 2018-02-11 ENCOUNTER — Encounter: Payer: Self-pay | Admitting: Family Medicine

## 2018-02-11 VITALS — BP 98/64 | HR 76 | Temp 98.5°F | Resp 16 | Ht 74.0 in

## 2018-02-11 DIAGNOSIS — E1165 Type 2 diabetes mellitus with hyperglycemia: Secondary | ICD-10-CM

## 2018-02-11 DIAGNOSIS — E11621 Type 2 diabetes mellitus with foot ulcer: Secondary | ICD-10-CM

## 2018-02-11 DIAGNOSIS — E1149 Type 2 diabetes mellitus with other diabetic neurological complication: Secondary | ICD-10-CM | POA: Diagnosis not present

## 2018-02-11 DIAGNOSIS — IMO0002 Reserved for concepts with insufficient information to code with codable children: Secondary | ICD-10-CM

## 2018-02-11 DIAGNOSIS — I5031 Acute diastolic (congestive) heart failure: Secondary | ICD-10-CM | POA: Diagnosis not present

## 2018-02-11 DIAGNOSIS — L03116 Cellulitis of left lower limb: Secondary | ICD-10-CM | POA: Diagnosis not present

## 2018-02-11 DIAGNOSIS — N183 Chronic kidney disease, stage 3 unspecified: Secondary | ICD-10-CM

## 2018-02-11 DIAGNOSIS — L97222 Non-pressure chronic ulcer of left calf with fat layer exposed: Secondary | ICD-10-CM | POA: Diagnosis not present

## 2018-02-11 DIAGNOSIS — L97529 Non-pressure chronic ulcer of other part of left foot with unspecified severity: Secondary | ICD-10-CM

## 2018-02-11 DIAGNOSIS — E11622 Type 2 diabetes mellitus with other skin ulcer: Secondary | ICD-10-CM | POA: Diagnosis not present

## 2018-02-11 DIAGNOSIS — Z23 Encounter for immunization: Secondary | ICD-10-CM | POA: Diagnosis not present

## 2018-02-11 DIAGNOSIS — G4730 Sleep apnea, unspecified: Secondary | ICD-10-CM | POA: Diagnosis not present

## 2018-02-11 DIAGNOSIS — I25119 Atherosclerotic heart disease of native coronary artery with unspecified angina pectoris: Secondary | ICD-10-CM | POA: Diagnosis not present

## 2018-02-11 DIAGNOSIS — I252 Old myocardial infarction: Secondary | ICD-10-CM | POA: Diagnosis not present

## 2018-02-11 DIAGNOSIS — L97822 Non-pressure chronic ulcer of other part of left lower leg with fat layer exposed: Secondary | ICD-10-CM | POA: Diagnosis not present

## 2018-02-11 DIAGNOSIS — E1140 Type 2 diabetes mellitus with diabetic neuropathy, unspecified: Secondary | ICD-10-CM | POA: Diagnosis not present

## 2018-02-11 DIAGNOSIS — I110 Hypertensive heart disease with heart failure: Secondary | ICD-10-CM | POA: Diagnosis not present

## 2018-02-11 LAB — POCT GLYCOSYLATED HEMOGLOBIN (HGB A1C): Hemoglobin A1C: 8 % — AB (ref 4.0–5.6)

## 2018-02-11 MED ORDER — INSULIN GLARGINE 100 UNIT/ML SOLOSTAR PEN: 25.0000 [IU] | PEN_INJECTOR | Freq: Every day | SUBCUTANEOUS | 2 refills | Status: DC

## 2018-02-11 NOTE — Patient Instructions (Addendum)
A few things to remember from today's visit:   Type II diabetes mellitus with neurological manifestations, uncontrolled ([place]) - Plan: POC HgB A1c  CKD (chronic kidney disease) stage 3, GFR 30-59 ml/min (HCC)  Diabetic ulcer of left foot associated with type 2 diabetes mellitus, unspecified part of foot, unspecified ulcer stage (HCC)  Lantus increased from 25 U to 30 U.   Please be sure medication list is accurate. If a new problem present, please set up appointment sooner than planned today.

## 2018-02-11 NOTE — Progress Notes (Signed)
HPI:   [name] is a 72 y.o. male, who is here today with his daughter for 5 months follow up.   He was last seen on 09/10/17.  Hypertension:  Currently on Carvedilol 3.125 mg bid and Losartan 25 mg 1/2 tab daily. BP mildly low today. Home BP's 90's/80's. He denies dizziness.  CHF, last echo 12/2016 LVEF 62-95%,MWUXL I diastolic dysfunction.  Recently followed with his cardiologist and meds were not changed.   He is taking medications as instructed, no side effects reported.  He has not noted unusual headache, visual changes, exertional chest pain, dyspnea,  focal weakness, or edema.   Lab Results  Component Value Date   CREATININE 2.02 (H) 01/30/2018   BUN 24 (H) 01/30/2018   NA 143 01/30/2018   K 4.5 01/30/2018   CL 109 01/30/2018   CO2 25 01/30/2018   Peripheral neuropathy and ankle ulcers.He is following with wound clinic weekly,today he has an appt.  According to daughter today may be his last appt because ulcers almost completely healed.  Sacral decubitus ulcer has healed it is intermittent. He is not ambulatory ,stays in his wheel chair all day.  No falls since his last OV.  CKD III: He was referred to nephrologist 10/16/17. According to pt ,he is supposed to follow annually. He has not noted gross hematuria,decreased urine output,or foam in urine.  Neurogenic bladder,follows with urologist's office for urine cath change monthly and sees urologist every 6 months.   Diabetes Mellitus II:  Currently on Lantus 25 U daily.  Checking BS's : upper 100's Hypoglycemia:Denies. HgA1C 7.7 on 09/10/17 He is tolerating medications well. He denies abdominal pain, nausea, vomiting, polydipsia, polyuria, or polyphagia.  He is eating better, in general he thinks he eats healthy. Canned soup for lunch more times.   Lab Results  Component Value Date   MICROALBUR 2.9 (H) 09/10/2017    Depression and insomnia: He is on Remeron 15 mg at bedtime. He  is still tolerating medication well. Denies depressed mood or suicidal thoughts. Sleeping well, around 7 hours.   Review of Systems  Constitutional: Negative for activity change, appetite change, fatigue and fever.  HENT: Negative for mouth sores, nosebleeds, sore throat and trouble swallowing.   Eyes: Negative for redness and visual disturbance.  Respiratory: Negative for apnea, cough, shortness of breath and wheezing.   Cardiovascular: Negative for chest pain, palpitations and leg swelling.  Gastrointestinal: Negative for abdominal pain, nausea and vomiting.  Genitourinary: Negative for decreased urine volume, dysuria and hematuria.  Musculoskeletal: Positive for arthralgias and gait problem.  Skin: Positive for wound. Negative for rash.  Neurological: Positive for numbness. Negative for dizziness, weakness and headaches.    Current Outpatient Medications on File Prior to Visit  Medication Sig Dispense Refill  . Alcohol Swabs (B-D SINGLE USE SWABS REGULAR) PADS USE TO TEST BLOOD SUGAR DAILY 100 each 3  . amiodarone (PACERONE) 200 MG tablet TAKE 1 TABLET(200 MG) BY MOUTH DAILY 90 tablet 3  . Ascorbic Acid (VITAMIN C PO) Take 1 tablet by mouth 2 (two) times daily.    Marland Kitchen aspirin EC 81 MG tablet Take 81 mg by mouth daily.    . Blood Glucose Calibration (TRUE METRIX LEVEL 1) Low SOLN USE TO TEST BLOOD SUGAR DAILY 1 each 3  . Blood Glucose Monitoring Suppl (TRUE METRIX AIR GLUCOSE METER) w/Device KIT 1 Device by Does not apply route daily. 1 kit 1  . carvedilol (COREG) 3.125 MG tablet  Take 1 tablet (3.125 mg total) by mouth 2 (two) times daily with a meal. 180 tablet 2  . clopidogrel (PLAVIX) 75 MG tablet Take 1 tablet (75 mg total) by mouth daily. 90 tablet 3  . clotrimazole-betamethasone (LOTRISONE) cream Apply 1 application topically daily as needed. mall amount. 30 g 1  . finasteride (PROSCAR) 5 MG tablet Take 1 tablet (5 mg total) by mouth daily. 90 tablet 2  . glucose blood test strip  Use as instructed 100 each 12  . HYDROcodone-acetaminophen (NORCO/VICODIN) 5-325 MG tablet Take 1 tablet by mouth 2 (two) times daily as needed for moderate pain. 50 tablet 0  . losartan (COZAAR) 25 MG tablet Take 0.5 tablets (12.5 mg total) by mouth 2 (two) times daily. 90 tablet 2  . MAGNESIUM PO Take 1 tablet by mouth every other day.    . mirtazapine (REMERON) 15 MG tablet TAKE 1 TABLET(15 MG) BY MOUTH AT BEDTIME 90 tablet 1  . multivitamin-iron-minerals-folic acid (THERAPEUTIC-M) TABS tablet Take 1 tablet by mouth daily.    Marland Kitchen nystatin (MYCOSTATIN/NYSTOP) powder Apply topically 3 (three) times daily. 60 g 2  . Probiotic Product (PROBIOTIC PO) Take 1 capsule by mouth daily.    . rosuvastatin (CRESTOR) 40 MG tablet Take 1 tablet (40 mg total) by mouth daily. 90 tablet 3  . tamsulosin (FLOMAX) 0.4 MG CAPS capsule Take 1 capsule (0.4 mg total) by mouth daily. 90 capsule 3  . torsemide (DEMADEX) 20 MG tablet TAKE 1 TABLET(20 MG) BY MOUTH EVERY OTHER DAY. START ON MONDAY 03/12/2016 90 tablet 3  . TRUEPLUS LANCETS 30G MISC Use to test blood sugar daily 100 each 5  . vitamin A 10000 UNIT capsule Take 10,000 Units by mouth daily.    Marland Kitchen zinc sulfate 220 (50 Zn) MG capsule Take 220 mg by mouth daily.     No current facility-administered medications on file prior to visit.      Past Medical History:  Diagnosis Date  . Automatic implantable cardioverter-defibrillator in situ   . CAD (coronary artery disease)    a. LHC (08/2005):  Ostial LAD 99%, mid LAD 50%, superior D1 80%, inferior D1 75%, mid OM proximal 80%, proximal PDA 25-30%, mid RCA 99%.   MRI with full thickness scar.  Med Rx.  2015 demonstrated similar diffuse three-vessel coronary disease. Perfusion imaging with rest we distribution demonstrated no viability in the area of the LAD and circumflex. Medical management.  . CAD, multiple vessel, RCA 100% mid occl.; LAD 99% stenosisprox.  mid of 50-60%; LCX OM-1 90%, OM-2 99%,AV groove 80%   12/10/2013  . Chronic combined systolic and diastolic heart failure ([place])   . CKD (chronic kidney disease)   . Gout   . Hx of echocardiogram 2015   Echo (08/2013):  EF 10-15%, diff HK, mod LAE, severe RVE, mod reduced RVSF, mild RAE, PASP 41 mmHg.  Marland Kitchen Hypertension   . Ischemic cardiomyopathy    Echo (8/11):  EF 40-45%;  Echo (5/15):  EF 10-15%  . Myocardial infarction [facility]) 2007   out of hosp MI - LHC with 3v CAD rx medically  . NSVT (nonsustained ventricular tachycardia) (HCC)    s/p ICD  . Peripheral neuropathy   . Sleep apnea    "has CPAP; won't use it"  . Stroke [facility]) ~ 2013   "right leg weak since" (12/09/2013)  . Type II diabetes mellitus ([place]) dx'd 2005   Allergies  Allergen Reactions  . Sulfa Antibiotics Rash    Social  History   Socioeconomic History  . Marital status: Married    Spouse name: Not on file  . Number of children: Not on file  . Years of education: Not on file  . Highest education level: Not on file  Occupational History  . Occupation: retired  Scientific laboratory technician  . Financial resource strain: Not on file  . Food insecurity:    Worry: Not on file    Inability: Not on file  . Transportation needs:    Medical: Not on file    Non-medical: Not on file  Tobacco Use  . Smoking status: Never Smoker  . Smokeless tobacco: Never Used  Substance and Sexual Activity  . Alcohol use: No  . Drug use: No  . Sexual activity: Not on file  Lifestyle  . Physical activity:    Days per week: Not on file    Minutes per session: Not on file  . Stress: Not on file  Relationships  . Social connections:    Talks on phone: Not on file    Gets together: Not on file    Attends religious service: Not on file    Active member of club or organization: Not on file    Attends meetings of clubs or organizations: Not on file    Relationship status: Not on file  Other Topics Concern  . Not on file  Social History Narrative   He is retired Veterinary surgeon for [employer].   He took early retirement at 72 years old due to medical reasons.   He lives with wife.   Highest level of education:  High school    Vitals:   02/11/18 0911  BP: 98/64  Pulse: 76  Resp: 16  Temp: 98.5 F (36.9 C)  SpO2: 98%   Body mass index is 23.11 kg/m.    Wt Readings from Last 3 Encounters:  12/04/16 180 lb (81.6 kg)  12/04/16 180 lb (81.6 kg)  03/10/16 147 lb 6.4 oz (66.9 kg)    Physical Exam  Nursing note reviewed. Constitutional: He is oriented to person, place, and time. He appears well-developed and well-nourished. No distress.  HENT:  Head: Normocephalic and atraumatic.  Mouth/Throat: Oropharynx is clear and moist and mucous membranes are normal.  Eyes: Pupils are equal, round, and reactive to light. Conjunctivae are normal.  Cardiovascular: Normal rate and regular rhythm.  Murmur (SEM I-II RUSB and LUSB) heard. DP present bilateral.  Respiratory: Effort normal and breath sounds normal. No respiratory distress.  GI: Soft. He exhibits no mass. There is no hepatomegaly. There is no tenderness.  Musculoskeletal: He exhibits no edema.  Lymphadenopathy:    He has no cervical adenopathy.  Neurological: He is alert and oriented to person, place, and time. He has normal strength. No cranial nerve deficit.  In his wheel chair.  Skin: Skin is warm. No rash noted. No erythema.  Psychiatric: He has a normal mood and affect. Cognition and memory are normal.  Well groomed, good eye contact.    ASSESSMENT AND PLAN:   Mr. OTTIE TILLERY was seen today for 6 months follow-up.  Orders Placed This Encounter  Procedures  . Flu vaccine HIGH DOSE PF  . Pneumococcal conjugate vaccine 13-valent IM  . POC HgB A1c    Lab Results  Component Value Date   HGBA1C 8.0 (A) 02/11/2018    1. Type II diabetes mellitus with neurological manifestations, uncontrolled ([place]) HgA1C is at goal. HgA1C went from 7.7 to 8.0. Lantus increased  from 25 U to 30 U.  Regular exercise  and healthy diet with avoidance of added sugar food intake is an important part of treatment and recommended. Annual eye exam, periodic dental and foot care recommended. F/U in 5-6 months  - POC HgB A1c  2. CKD (chronic kidney disease) stage 3, GFR 30-59 ml/min (HCC) Stable otherwise. Low salt diet recommended. Continue Losartan 12.5 mg daily. Avoidance of NSAID's to continue.  3. Diabetic ulcer of left foot associated with type 2 diabetes mellitus, unspecified part of foot, unspecified ulcer stage ([place]) Following with wound clinic. Per pt and doughter's report ulcers healing well.   4. Encounter for immunization - Flu vaccine HIGH DOSE PF  5. Need for vaccination against Streptococcus pneumoniae using pneumococcal conjugate vaccine 13 - Pneumococcal conjugate vaccine 13-valent IM    Jaxiel Kines G. [name], MD  [facility]. [place] office.

## 2018-02-13 ENCOUNTER — Encounter: Payer: Self-pay | Admitting: Cardiology

## 2018-02-13 ENCOUNTER — Encounter: Payer: Self-pay | Admitting: Family Medicine

## 2018-02-24 ENCOUNTER — Encounter (HOSPITAL_BASED_OUTPATIENT_CLINIC_OR_DEPARTMENT_OTHER): Payer: Medicare HMO | Attending: Internal Medicine

## 2018-02-24 DIAGNOSIS — I25119 Atherosclerotic heart disease of native coronary artery with unspecified angina pectoris: Secondary | ICD-10-CM | POA: Insufficient documentation

## 2018-02-24 DIAGNOSIS — I110 Hypertensive heart disease with heart failure: Secondary | ICD-10-CM | POA: Insufficient documentation

## 2018-02-24 DIAGNOSIS — N312 Flaccid neuropathic bladder, not elsewhere classified: Secondary | ICD-10-CM | POA: Diagnosis not present

## 2018-02-24 DIAGNOSIS — I252 Old myocardial infarction: Secondary | ICD-10-CM | POA: Insufficient documentation

## 2018-02-24 DIAGNOSIS — I509 Heart failure, unspecified: Secondary | ICD-10-CM | POA: Insufficient documentation

## 2018-02-24 DIAGNOSIS — G4730 Sleep apnea, unspecified: Secondary | ICD-10-CM | POA: Diagnosis not present

## 2018-02-24 DIAGNOSIS — L97221 Non-pressure chronic ulcer of left calf limited to breakdown of skin: Secondary | ICD-10-CM | POA: Diagnosis not present

## 2018-02-24 DIAGNOSIS — E1140 Type 2 diabetes mellitus with diabetic neuropathy, unspecified: Secondary | ICD-10-CM | POA: Insufficient documentation

## 2018-02-24 DIAGNOSIS — E11622 Type 2 diabetes mellitus with other skin ulcer: Secondary | ICD-10-CM | POA: Diagnosis not present

## 2018-02-24 DIAGNOSIS — L97222 Non-pressure chronic ulcer of left calf with fat layer exposed: Secondary | ICD-10-CM | POA: Diagnosis not present

## 2018-03-04 ENCOUNTER — Other Ambulatory Visit (HOSPITAL_COMMUNITY)
Admission: RE | Admit: 2018-03-04 | Discharge: 2018-03-04 | Disposition: A | Discharge status: Home / Self Care | Payer: Medicare HMO | Source: Other Acute Inpatient Hospital | Attending: Internal Medicine | Admitting: Internal Medicine

## 2018-03-04 DIAGNOSIS — G4730 Sleep apnea, unspecified: Secondary | ICD-10-CM | POA: Diagnosis not present

## 2018-03-04 DIAGNOSIS — L97222 Non-pressure chronic ulcer of left calf with fat layer exposed: Secondary | ICD-10-CM | POA: Diagnosis not present

## 2018-03-04 DIAGNOSIS — I252 Old myocardial infarction: Secondary | ICD-10-CM | POA: Diagnosis not present

## 2018-03-04 DIAGNOSIS — L97221 Non-pressure chronic ulcer of left calf limited to breakdown of skin: Secondary | ICD-10-CM | POA: Insufficient documentation

## 2018-03-04 DIAGNOSIS — E1140 Type 2 diabetes mellitus with diabetic neuropathy, unspecified: Secondary | ICD-10-CM | POA: Diagnosis not present

## 2018-03-04 DIAGNOSIS — I110 Hypertensive heart disease with heart failure: Secondary | ICD-10-CM | POA: Diagnosis not present

## 2018-03-04 DIAGNOSIS — I509 Heart failure, unspecified: Secondary | ICD-10-CM | POA: Diagnosis not present

## 2018-03-04 DIAGNOSIS — I25119 Atherosclerotic heart disease of native coronary artery with unspecified angina pectoris: Secondary | ICD-10-CM | POA: Diagnosis not present

## 2018-03-07 LAB — AEROBIC CULTURE W GRAM STAIN (SUPERFICIAL SPECIMEN)

## 2018-03-07 LAB — AEROBIC CULTURE  (SUPERFICIAL SPECIMEN): CULTURE: NO GROWTH

## 2018-03-11 DIAGNOSIS — L97229 Non-pressure chronic ulcer of left calf with unspecified severity: Secondary | ICD-10-CM | POA: Diagnosis not present

## 2018-03-11 DIAGNOSIS — I252 Old myocardial infarction: Secondary | ICD-10-CM | POA: Diagnosis not present

## 2018-03-11 DIAGNOSIS — I509 Heart failure, unspecified: Secondary | ICD-10-CM | POA: Diagnosis not present

## 2018-03-11 DIAGNOSIS — E1140 Type 2 diabetes mellitus with diabetic neuropathy, unspecified: Secondary | ICD-10-CM | POA: Diagnosis not present

## 2018-03-11 DIAGNOSIS — L97221 Non-pressure chronic ulcer of left calf limited to breakdown of skin: Secondary | ICD-10-CM | POA: Diagnosis not present

## 2018-03-11 DIAGNOSIS — G4730 Sleep apnea, unspecified: Secondary | ICD-10-CM | POA: Diagnosis not present

## 2018-03-11 DIAGNOSIS — I25119 Atherosclerotic heart disease of native coronary artery with unspecified angina pectoris: Secondary | ICD-10-CM | POA: Diagnosis not present

## 2018-03-11 DIAGNOSIS — I110 Hypertensive heart disease with heart failure: Secondary | ICD-10-CM | POA: Diagnosis not present

## 2018-03-25 ENCOUNTER — Encounter (HOSPITAL_BASED_OUTPATIENT_CLINIC_OR_DEPARTMENT_OTHER): Payer: Medicare HMO | Attending: Internal Medicine

## 2018-03-25 DIAGNOSIS — E1140 Type 2 diabetes mellitus with diabetic neuropathy, unspecified: Secondary | ICD-10-CM | POA: Insufficient documentation

## 2018-03-25 DIAGNOSIS — G4730 Sleep apnea, unspecified: Secondary | ICD-10-CM | POA: Insufficient documentation

## 2018-03-25 DIAGNOSIS — I110 Hypertensive heart disease with heart failure: Secondary | ICD-10-CM | POA: Insufficient documentation

## 2018-03-25 DIAGNOSIS — I5031 Acute diastolic (congestive) heart failure: Secondary | ICD-10-CM | POA: Insufficient documentation

## 2018-03-25 DIAGNOSIS — L97822 Non-pressure chronic ulcer of other part of left lower leg with fat layer exposed: Secondary | ICD-10-CM | POA: Insufficient documentation

## 2018-03-25 DIAGNOSIS — I25119 Atherosclerotic heart disease of native coronary artery with unspecified angina pectoris: Secondary | ICD-10-CM | POA: Insufficient documentation

## 2018-03-25 DIAGNOSIS — L03116 Cellulitis of left lower limb: Secondary | ICD-10-CM | POA: Insufficient documentation

## 2018-03-25 DIAGNOSIS — I252 Old myocardial infarction: Secondary | ICD-10-CM | POA: Insufficient documentation

## 2018-03-31 DIAGNOSIS — G4730 Sleep apnea, unspecified: Secondary | ICD-10-CM | POA: Diagnosis not present

## 2018-03-31 DIAGNOSIS — E1140 Type 2 diabetes mellitus with diabetic neuropathy, unspecified: Secondary | ICD-10-CM | POA: Diagnosis not present

## 2018-03-31 DIAGNOSIS — L97222 Non-pressure chronic ulcer of left calf with fat layer exposed: Secondary | ICD-10-CM | POA: Diagnosis not present

## 2018-03-31 DIAGNOSIS — I5031 Acute diastolic (congestive) heart failure: Secondary | ICD-10-CM | POA: Diagnosis not present

## 2018-03-31 DIAGNOSIS — E11622 Type 2 diabetes mellitus with other skin ulcer: Secondary | ICD-10-CM | POA: Diagnosis not present

## 2018-03-31 DIAGNOSIS — L97822 Non-pressure chronic ulcer of other part of left lower leg with fat layer exposed: Secondary | ICD-10-CM | POA: Diagnosis not present

## 2018-03-31 DIAGNOSIS — I110 Hypertensive heart disease with heart failure: Secondary | ICD-10-CM | POA: Diagnosis not present

## 2018-03-31 DIAGNOSIS — N312 Flaccid neuropathic bladder, not elsewhere classified: Secondary | ICD-10-CM | POA: Diagnosis not present

## 2018-03-31 DIAGNOSIS — I25119 Atherosclerotic heart disease of native coronary artery with unspecified angina pectoris: Secondary | ICD-10-CM | POA: Diagnosis not present

## 2018-03-31 DIAGNOSIS — L03116 Cellulitis of left lower limb: Secondary | ICD-10-CM | POA: Diagnosis not present

## 2018-03-31 DIAGNOSIS — I252 Old myocardial infarction: Secondary | ICD-10-CM | POA: Diagnosis not present

## 2018-04-14 DIAGNOSIS — I110 Hypertensive heart disease with heart failure: Secondary | ICD-10-CM | POA: Diagnosis not present

## 2018-04-14 DIAGNOSIS — G4730 Sleep apnea, unspecified: Secondary | ICD-10-CM | POA: Diagnosis not present

## 2018-04-14 DIAGNOSIS — L97222 Non-pressure chronic ulcer of left calf with fat layer exposed: Secondary | ICD-10-CM | POA: Diagnosis not present

## 2018-04-14 DIAGNOSIS — L97822 Non-pressure chronic ulcer of other part of left lower leg with fat layer exposed: Secondary | ICD-10-CM | POA: Diagnosis not present

## 2018-04-14 DIAGNOSIS — L03116 Cellulitis of left lower limb: Secondary | ICD-10-CM | POA: Diagnosis not present

## 2018-04-14 DIAGNOSIS — E1140 Type 2 diabetes mellitus with diabetic neuropathy, unspecified: Secondary | ICD-10-CM | POA: Diagnosis not present

## 2018-04-14 DIAGNOSIS — I252 Old myocardial infarction: Secondary | ICD-10-CM | POA: Diagnosis not present

## 2018-04-14 DIAGNOSIS — I5031 Acute diastolic (congestive) heart failure: Secondary | ICD-10-CM | POA: Diagnosis not present

## 2018-04-14 DIAGNOSIS — I25119 Atherosclerotic heart disease of native coronary artery with unspecified angina pectoris: Secondary | ICD-10-CM | POA: Diagnosis not present

## 2018-04-29 ENCOUNTER — Encounter (HOSPITAL_BASED_OUTPATIENT_CLINIC_OR_DEPARTMENT_OTHER): Payer: Medicare HMO | Attending: Internal Medicine

## 2018-04-29 DIAGNOSIS — I5031 Acute diastolic (congestive) heart failure: Secondary | ICD-10-CM | POA: Diagnosis not present

## 2018-04-29 DIAGNOSIS — E1140 Type 2 diabetes mellitus with diabetic neuropathy, unspecified: Secondary | ICD-10-CM | POA: Insufficient documentation

## 2018-04-29 DIAGNOSIS — L03116 Cellulitis of left lower limb: Secondary | ICD-10-CM | POA: Diagnosis not present

## 2018-04-29 DIAGNOSIS — I25119 Atherosclerotic heart disease of native coronary artery with unspecified angina pectoris: Secondary | ICD-10-CM | POA: Insufficient documentation

## 2018-04-29 DIAGNOSIS — I110 Hypertensive heart disease with heart failure: Secondary | ICD-10-CM | POA: Diagnosis not present

## 2018-04-29 DIAGNOSIS — L97222 Non-pressure chronic ulcer of left calf with fat layer exposed: Secondary | ICD-10-CM | POA: Insufficient documentation

## 2018-04-29 DIAGNOSIS — E11622 Type 2 diabetes mellitus with other skin ulcer: Secondary | ICD-10-CM | POA: Insufficient documentation

## 2018-04-29 DIAGNOSIS — I252 Old myocardial infarction: Secondary | ICD-10-CM | POA: Insufficient documentation

## 2018-04-29 DIAGNOSIS — G4730 Sleep apnea, unspecified: Secondary | ICD-10-CM | POA: Diagnosis not present

## 2018-04-29 DIAGNOSIS — N312 Flaccid neuropathic bladder, not elsewhere classified: Secondary | ICD-10-CM | POA: Diagnosis not present

## 2018-05-12 ENCOUNTER — Encounter: Payer: Self-pay | Admitting: Family Medicine

## 2018-05-12 ENCOUNTER — Ambulatory Visit (INDEPENDENT_AMBULATORY_CARE_PROVIDER_SITE_OTHER): Payer: Medicare HMO | Admitting: Family Medicine

## 2018-05-12 ENCOUNTER — Ambulatory Visit (INDEPENDENT_AMBULATORY_CARE_PROVIDER_SITE_OTHER): Payer: Medicare HMO

## 2018-05-12 ENCOUNTER — Ambulatory Visit: Payer: Self-pay

## 2018-05-12 VITALS — BP 120/82 | HR 76 | Temp 97.7°F | Resp 16 | Ht 74.0 in

## 2018-05-12 DIAGNOSIS — R05 Cough: Secondary | ICD-10-CM

## 2018-05-12 DIAGNOSIS — R0989 Other specified symptoms and signs involving the circulatory and respiratory systems: Secondary | ICD-10-CM

## 2018-05-12 DIAGNOSIS — J988 Other specified respiratory disorders: Secondary | ICD-10-CM | POA: Diagnosis not present

## 2018-05-12 DIAGNOSIS — J9811 Atelectasis: Secondary | ICD-10-CM | POA: Diagnosis not present

## 2018-05-12 DIAGNOSIS — J989 Respiratory disorder, unspecified: Secondary | ICD-10-CM

## 2018-05-12 DIAGNOSIS — R059 Cough, unspecified: Secondary | ICD-10-CM

## 2018-05-12 MED ORDER — ALBUTEROL SULFATE HFA 108 (90 BASE) MCG/ACT IN AERS: 2.0000 | INHALATION_SPRAY | Freq: Four times a day (QID) | RESPIRATORY_TRACT | 0 refills | Status: DC | PRN

## 2018-05-12 MED ORDER — DOXYCYCLINE HYCLATE 100 MG PO TABS: 100.0000 mg | ORAL_TABLET | Freq: Two times a day (BID) | ORAL | 0 refills | Status: AC

## 2018-05-12 NOTE — Patient Instructions (Signed)
A few things to remember from today's visit:   Cough - Plan: DG Chest 2 View  Reactive airway disease that is not asthma - Plan: DG Chest 2 View, albuterol (PROVENTIL HFA;VENTOLIN HFA) 108 (90 Base) MCG/ACT inhaler  Respiratory tract infection - Plan: doxycycline (VIBRA-TABS) 100 MG tablet  Albuterol inh 2 puff every 6 hours for a week then as needed for wheezing or shortness of breath.    Please be sure medication list is accurate. If a new problem present, please set up appointment sooner than planned today.

## 2018-05-12 NOTE — Telephone Encounter (Signed)
Pt.'s daughter reports pt. Had a cold 1 1/2 month ago. Last week he started coughing. Coughing up yellow - green mucus. Has some wheezing as well. Not sleeping well with the cough. Has a history of CHF. No fever. Appointment made for today with his provider.  Reason for Disposition . SEVERE coughing spells (e.g., whooping sound after coughing, vomiting after coughing)  Answer Assessment - Initial Assessment Questions 1. ONSET: "When did the cough begin?"      Cold 1 1/2 month ago and started coughing last week 2. SEVERITY: "How bad is the cough today?"      Severe 3. RESPIRATORY DISTRESS: "Describe your breathing."      CHF 4. FEVER: "Do you have a fever?" If so, ask: "What is your temperature, how was it measured, and when did it start?"     No 5. SPUTUM: "Describe the color of your sputum" (clear, white, yellow, green)     Yellow- green 6. HEMOPTYSIS: "Are you coughing up any blood?" If so ask: "How much?" (flecks, streaks, tablespoons, etc.)     No 7. CARDIAC HISTORY: "Do you have any history of heart disease?" (e.g., heart attack, congestive heart failure)      CHF 8. LUNG HISTORY: "Do you have any history of lung disease?"  (e.g., pulmonary embolus, asthma, emphysema)     No 9. PE RISK FACTORS: "Do you have a history of blood clots?" (or: recent major surgery, recent prolonged travel, bedridden)     Paraplegic 10. OTHER SYMPTOMS: "Do you have any other symptoms?" (e.g., runny nose, wheezing, chest pain)       Wheezing 11. PREGNANCY: "Is there any chance you are pregnant?" "When was your last menstrual period?"       n/a 12. TRAVEL: "Have you traveled out of the country in the last month?" (e.g., travel history, exposures)       No  Protocols used: [place]

## 2018-05-12 NOTE — Telephone Encounter (Signed)
FYI sent to Dr. [name]. Appointment scheduled for today.

## 2018-05-12 NOTE — Progress Notes (Signed)
ACUTE VISIT  HPI:  Chief Complaint  Patient presents with  . Cough    cough with green phlem and wheezing x 1 week    [name] [name] is a 73 y.o.male here today with his daughter complaining of 6-7 days of worsening respiratory symptoms. He has had a "cold" for about a month, thought to be "sinuses", seemed to be doing better. Productive cough with greenish sputum. Denies hemoptysis.  + Wheezing and dyspnea. Symptoms seems to be worse at night when lying down but also having symptoms during the day with exertion. Denies chest pain,PND, or edema. Sleeping with same number of pillows he usually does.  Hx of CHF,he is not weighting at home.   Negative for fever or chills. +Decreased appetite.   Cough  This is a new problem. The current episode started in the past 7 days. Associated symptoms include nasal congestion, postnasal drip, rhinorrhea, shortness of breath and wheezing. Pertinent negatives include no chest pain, chills, ear congestion, ear pain, eye redness, fever, headaches, heartburn, hemoptysis or sore throat. The symptoms are aggravated by exercise and lying down. Risk factors for lung disease include smoking/tobacco exposure. He has tried OTC cough suppressant for the symptoms. The treatment provided mild relief. His past medical history is significant for environmental allergies.    No Hx of recent travel. His daughter has been sick with URI.Marland Kitchen No known insect bite.   OTC medications for this problem: Tylenol cold and sinuses.   Review of Systems  Constitutional: Positive for appetite change and fatigue. Negative for activity change, chills and fever.  HENT: Positive for postnasal drip and rhinorrhea. Negative for ear pain, mouth sores, sore throat and voice change.   Eyes: Negative for discharge, redness and itching.  Respiratory: Positive for cough, shortness of breath and wheezing. Negative for hemoptysis.   Cardiovascular: Negative for chest  pain and leg swelling.  Gastrointestinal: Negative for abdominal pain, diarrhea, heartburn, nausea and vomiting.  Allergic/Immunologic: Positive for environmental allergies.  Neurological: Negative for weakness and headaches.    Current Outpatient Medications on File Prior to Visit  Medication Sig Dispense Refill  . Alcohol Swabs (B-D SINGLE USE SWABS REGULAR) PADS USE TO TEST BLOOD SUGAR DAILY 100 each 3  . amiodarone (PACERONE) 200 MG tablet TAKE 1 TABLET(200 MG) BY MOUTH DAILY 90 tablet 3  . Ascorbic Acid (VITAMIN C PO) Take 1 tablet by mouth 2 (two) times daily.    Marland Kitchen aspirin EC 81 MG tablet Take 81 mg by mouth daily.    . Blood Glucose Calibration (TRUE METRIX LEVEL 1) Low SOLN USE TO TEST BLOOD SUGAR DAILY 1 each 3  . Blood Glucose Monitoring Suppl (TRUE METRIX AIR GLUCOSE METER) w/Device KIT 1 Device by Does not apply route daily. 1 kit 1  . carvedilol (COREG) 3.125 MG tablet Take 1 tablet (3.125 mg total) by mouth 2 (two) times daily with a meal. 180 tablet 2  . clopidogrel (PLAVIX) 75 MG tablet Take 1 tablet (75 mg total) by mouth daily. 90 tablet 3  . clotrimazole-betamethasone (LOTRISONE) cream Apply 1 application topically daily as needed. mall amount. 30 g 1  . finasteride (PROSCAR) 5 MG tablet Take 1 tablet (5 mg total) by mouth daily. 90 tablet 2  . glucose blood test strip Use as instructed 100 each 12  . HYDROcodone-acetaminophen (NORCO/VICODIN) 5-325 MG tablet Take 1 tablet by mouth 2 (two) times daily as needed for moderate pain. 50 tablet 0  . Insulin  Glargine (LANTUS SOLOSTAR) 100 UNIT/ML Solostar Pen Inject 25 Units into the skin at bedtime. 15 mL 2  . losartan (COZAAR) 25 MG tablet Take 0.5 tablets (12.5 mg total) by mouth 2 (two) times daily. 90 tablet 2  . MAGNESIUM PO Take 1 tablet by mouth every other day.    . mirtazapine (REMERON) 15 MG tablet TAKE 1 TABLET(15 MG) BY MOUTH AT BEDTIME 90 tablet 1  . multivitamin-iron-minerals-folic acid (THERAPEUTIC-M) TABS tablet  Take 1 tablet by mouth daily.    Marland Kitchen nystatin (MYCOSTATIN/NYSTOP) powder Apply topically 3 (three) times daily. 60 g 2  . Probiotic Product (PROBIOTIC PO) Take 1 capsule by mouth daily.    . rosuvastatin (CRESTOR) 40 MG tablet Take 1 tablet (40 mg total) by mouth daily. 90 tablet 3  . tamsulosin (FLOMAX) 0.4 MG CAPS capsule Take 1 capsule (0.4 mg total) by mouth daily. 90 capsule 3  . torsemide (DEMADEX) 20 MG tablet TAKE 1 TABLET(20 MG) BY MOUTH EVERY OTHER DAY. START ON MONDAY 03/12/2016 90 tablet 3  . TRUEPLUS LANCETS 30G MISC Use to test blood sugar daily 100 each 5  . vitamin A 10000 UNIT capsule Take 10,000 Units by mouth daily.    Marland Kitchen zinc sulfate 220 (50 Zn) MG capsule Take 220 mg by mouth daily.     No current facility-administered medications on file prior to visit.      Past Medical History:  Diagnosis Date  . Automatic implantable cardioverter-defibrillator in situ   . CAD (coronary artery disease)    a. LHC (08/2005):  Ostial LAD 99%, mid LAD 50%, superior D1 80%, inferior D1 75%, mid OM proximal 80%, proximal PDA 25-30%, mid RCA 99%.   MRI with full thickness scar.  Med Rx.  2015 demonstrated similar diffuse three-vessel coronary disease. Perfusion imaging with rest we distribution demonstrated no viability in the area of the LAD and circumflex. Medical management.  . CAD, multiple vessel, RCA 100% mid occl.; LAD 99% stenosisprox.  mid of 50-60%; LCX OM-1 90%, OM-2 99%,AV groove 80%  12/10/2013  . Chronic combined systolic and diastolic heart failure ([place])   . CKD (chronic kidney disease)   . Gout   . Hx of echocardiogram 2015   Echo (08/2013):  EF 10-15%, diff HK, mod LAE, severe RVE, mod reduced RVSF, mild RAE, PASP 41 mmHg.  Marland Kitchen Hypertension   . Ischemic cardiomyopathy    Echo (8/11):  EF 40-45%;  Echo (5/15):  EF 10-15%  . Myocardial infarction [facility]) 2007   out of hosp MI - LHC with 3v CAD rx medically  . NSVT (nonsustained ventricular tachycardia) (HCC)    s/p ICD  .  Peripheral neuropathy   . Sleep apnea    "has CPAP; won't use it"  . Stroke [facility]) ~ 2013   "right leg weak since" (12/09/2013)  . Type II diabetes mellitus ([place]) dx'd 2005   Allergies  Allergen Reactions  . Sulfa Antibiotics Rash    Social History   Socioeconomic History  . Marital status: Married    Spouse name: Not on file  . Number of children: Not on file  . Years of education: Not on file  . Highest education level: Not on file  Occupational History  . Occupation: retired  Scientific laboratory technician  . Financial resource strain: Not on file  . Food insecurity:    Worry: Not on file    Inability: Not on file  . Transportation needs:    Medical: Not on file  Non-medical: Not on file  Tobacco Use  . Smoking status: Never Smoker  . Smokeless tobacco: Never Used  Substance and Sexual Activity  . Alcohol use: No  . Drug use: No  . Sexual activity: Not on file  Lifestyle  . Physical activity:    Days per week: Not on file    Minutes per session: Not on file  . Stress: Not on file  Relationships  . Social connections:    Talks on phone: Not on file    Gets together: Not on file    Attends religious service: Not on file    Active member of club or organization: Not on file    Attends meetings of clubs or organizations: Not on file    Relationship status: Not on file  Other Topics Concern  . 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   He lives with wife.   Highest level of education:  High school    Vitals:   05/12/18 1456  BP: 120/82  Pulse: 76  Resp: 16  Temp: 97.7 F (36.5 C)  SpO2: 95%   Body mass index is 23.11 kg/m.   Physical Exam  Nursing note and vitals reviewed. Constitutional: He is oriented to person, place, and time. He appears well-developed and well-nourished. He does not appear ill. No distress.  HENT:  Head: Normocephalic and  atraumatic.  Mouth/Throat: Oropharynx is clear and moist and mucous membranes are normal.  Eyes: Conjunctivae are normal.  Cardiovascular: Normal rate and regular rhythm.  Murmur (SEM I-II RUSB-LUSB) heard. Respiratory: Effort normal. No respiratory distress. He has no wheezes. He has rales (fine, bibasilar).  Musculoskeletal:        General: No edema.  Lymphadenopathy:       Head (right side): No submandibular adenopathy present.       Head (left side): No submandibular adenopathy present.    He has no cervical adenopathy.  Neurological: He is alert and oriented to person, place, and time. He has normal strength.  Wheel chair.  Skin: Skin is warm. No rash noted. No erythema.  Psychiatric: He has a normal mood and affect.  Well groomed, good eye contact.    ASSESSMENT AND PLAN:   Mr. Willam was seen today for cough.  Diagnoses and all orders for this visit:  Cough We discussed possible causes. Fine rales bibasilar, CXR ordered. Explained that cough can last a few weeks after respiratory infection. Instructed about warning signs.  -     DG Chest 2 View; Future  Reactive airway disease that is not asthma No wheezing appreciated today. Albuterol inh 2 puff every 6 hours for a week then as needed for wheezing or shortness of breath.  I do not think oral steroid is needed.  -     DG Chest 2 View; Future -     albuterol (PROVENTIL HFA;VENTOLIN HFA) 108 (90 Base) MCG/ACT inhaler; Inhale 2 puffs into the lungs every 6 (six) hours as needed for up to 30 days for wheezing or shortness of breath.  Respiratory tract infection Side effects of abx discussed. Instructed about warning signs.  -     doxycycline (VIBRA-TABS) 100 MG tablet; Take 1 tablet (100 mg total) by mouth 2 (two) times daily for 7 days.     Return if symptoms worsen or fail to improve.  Rashon Westrup G. [name], MD  [facility]. [place] office.

## 2018-05-13 DIAGNOSIS — I25119 Atherosclerotic heart disease of native coronary artery with unspecified angina pectoris: Secondary | ICD-10-CM | POA: Diagnosis not present

## 2018-05-13 DIAGNOSIS — I252 Old myocardial infarction: Secondary | ICD-10-CM | POA: Diagnosis not present

## 2018-05-13 DIAGNOSIS — I110 Hypertensive heart disease with heart failure: Secondary | ICD-10-CM | POA: Diagnosis not present

## 2018-05-13 DIAGNOSIS — I5031 Acute diastolic (congestive) heart failure: Secondary | ICD-10-CM | POA: Diagnosis not present

## 2018-05-13 DIAGNOSIS — E1140 Type 2 diabetes mellitus with diabetic neuropathy, unspecified: Secondary | ICD-10-CM | POA: Diagnosis not present

## 2018-05-13 DIAGNOSIS — G4730 Sleep apnea, unspecified: Secondary | ICD-10-CM | POA: Diagnosis not present

## 2018-05-13 DIAGNOSIS — L03116 Cellulitis of left lower limb: Secondary | ICD-10-CM | POA: Diagnosis not present

## 2018-05-13 DIAGNOSIS — E11622 Type 2 diabetes mellitus with other skin ulcer: Secondary | ICD-10-CM | POA: Diagnosis not present

## 2018-05-13 DIAGNOSIS — L97222 Non-pressure chronic ulcer of left calf with fat layer exposed: Secondary | ICD-10-CM | POA: Diagnosis not present

## 2018-05-19 ENCOUNTER — Other Ambulatory Visit: Payer: Self-pay | Admitting: Family Medicine

## 2018-05-27 ENCOUNTER — Encounter (HOSPITAL_BASED_OUTPATIENT_CLINIC_OR_DEPARTMENT_OTHER): Payer: Medicare HMO | Attending: Internal Medicine

## 2018-05-27 DIAGNOSIS — E11622 Type 2 diabetes mellitus with other skin ulcer: Secondary | ICD-10-CM | POA: Diagnosis not present

## 2018-05-27 DIAGNOSIS — I110 Hypertensive heart disease with heart failure: Secondary | ICD-10-CM | POA: Insufficient documentation

## 2018-05-27 DIAGNOSIS — E1140 Type 2 diabetes mellitus with diabetic neuropathy, unspecified: Secondary | ICD-10-CM | POA: Insufficient documentation

## 2018-05-27 DIAGNOSIS — I5031 Acute diastolic (congestive) heart failure: Secondary | ICD-10-CM | POA: Insufficient documentation

## 2018-05-27 DIAGNOSIS — L03116 Cellulitis of left lower limb: Secondary | ICD-10-CM | POA: Diagnosis not present

## 2018-05-27 DIAGNOSIS — G4730 Sleep apnea, unspecified: Secondary | ICD-10-CM | POA: Diagnosis not present

## 2018-05-27 DIAGNOSIS — I252 Old myocardial infarction: Secondary | ICD-10-CM | POA: Insufficient documentation

## 2018-05-27 DIAGNOSIS — S81802A Unspecified open wound, left lower leg, initial encounter: Secondary | ICD-10-CM | POA: Diagnosis not present

## 2018-05-27 DIAGNOSIS — L97822 Non-pressure chronic ulcer of other part of left lower leg with fat layer exposed: Secondary | ICD-10-CM | POA: Diagnosis not present

## 2018-05-27 DIAGNOSIS — I25119 Atherosclerotic heart disease of native coronary artery with unspecified angina pectoris: Secondary | ICD-10-CM | POA: Insufficient documentation

## 2018-06-03 DIAGNOSIS — N312 Flaccid neuropathic bladder, not elsewhere classified: Secondary | ICD-10-CM | POA: Diagnosis not present

## 2018-06-04 DIAGNOSIS — N189 Chronic kidney disease, unspecified: Secondary | ICD-10-CM | POA: Diagnosis not present

## 2018-06-04 DIAGNOSIS — I129 Hypertensive chronic kidney disease with stage 1 through stage 4 chronic kidney disease, or unspecified chronic kidney disease: Secondary | ICD-10-CM | POA: Diagnosis not present

## 2018-06-04 DIAGNOSIS — I509 Heart failure, unspecified: Secondary | ICD-10-CM | POA: Diagnosis not present

## 2018-06-10 DIAGNOSIS — I5031 Acute diastolic (congestive) heart failure: Secondary | ICD-10-CM | POA: Diagnosis not present

## 2018-06-10 DIAGNOSIS — L97222 Non-pressure chronic ulcer of left calf with fat layer exposed: Secondary | ICD-10-CM | POA: Diagnosis not present

## 2018-06-10 DIAGNOSIS — I25119 Atherosclerotic heart disease of native coronary artery with unspecified angina pectoris: Secondary | ICD-10-CM | POA: Diagnosis not present

## 2018-06-10 DIAGNOSIS — L03116 Cellulitis of left lower limb: Secondary | ICD-10-CM | POA: Diagnosis not present

## 2018-06-10 DIAGNOSIS — I110 Hypertensive heart disease with heart failure: Secondary | ICD-10-CM | POA: Diagnosis not present

## 2018-06-10 DIAGNOSIS — L97822 Non-pressure chronic ulcer of other part of left lower leg with fat layer exposed: Secondary | ICD-10-CM | POA: Diagnosis not present

## 2018-06-10 DIAGNOSIS — I252 Old myocardial infarction: Secondary | ICD-10-CM | POA: Diagnosis not present

## 2018-06-10 DIAGNOSIS — L539 Erythematous condition, unspecified: Secondary | ICD-10-CM | POA: Diagnosis not present

## 2018-06-10 DIAGNOSIS — E11622 Type 2 diabetes mellitus with other skin ulcer: Secondary | ICD-10-CM | POA: Diagnosis not present

## 2018-06-10 DIAGNOSIS — G4730 Sleep apnea, unspecified: Secondary | ICD-10-CM | POA: Diagnosis not present

## 2018-06-10 DIAGNOSIS — E1140 Type 2 diabetes mellitus with diabetic neuropathy, unspecified: Secondary | ICD-10-CM | POA: Diagnosis not present

## 2018-06-17 ENCOUNTER — Ambulatory Visit (HOSPITAL_COMMUNITY)
Admission: RE | Admit: 2018-06-17 | Discharge: 2018-06-17 | Disposition: A | Discharge status: Home / Self Care | Payer: Medicare HMO | Source: Ambulatory Visit | Attending: Internal Medicine | Admitting: Internal Medicine

## 2018-06-17 ENCOUNTER — Other Ambulatory Visit (HOSPITAL_COMMUNITY): Payer: Self-pay | Admitting: Internal Medicine

## 2018-06-17 DIAGNOSIS — E1140 Type 2 diabetes mellitus with diabetic neuropathy, unspecified: Secondary | ICD-10-CM | POA: Diagnosis not present

## 2018-06-17 DIAGNOSIS — L97221 Non-pressure chronic ulcer of left calf limited to breakdown of skin: Secondary | ICD-10-CM

## 2018-06-17 DIAGNOSIS — L97222 Non-pressure chronic ulcer of left calf with fat layer exposed: Secondary | ICD-10-CM | POA: Diagnosis not present

## 2018-06-17 DIAGNOSIS — I5031 Acute diastolic (congestive) heart failure: Secondary | ICD-10-CM | POA: Diagnosis not present

## 2018-06-17 DIAGNOSIS — G4730 Sleep apnea, unspecified: Secondary | ICD-10-CM | POA: Diagnosis not present

## 2018-06-17 DIAGNOSIS — S91302A Unspecified open wound, left foot, initial encounter: Secondary | ICD-10-CM | POA: Diagnosis not present

## 2018-06-17 DIAGNOSIS — L03116 Cellulitis of left lower limb: Secondary | ICD-10-CM | POA: Diagnosis not present

## 2018-06-17 DIAGNOSIS — I252 Old myocardial infarction: Secondary | ICD-10-CM | POA: Diagnosis not present

## 2018-06-17 DIAGNOSIS — L97822 Non-pressure chronic ulcer of other part of left lower leg with fat layer exposed: Secondary | ICD-10-CM | POA: Diagnosis not present

## 2018-06-17 DIAGNOSIS — E11622 Type 2 diabetes mellitus with other skin ulcer: Secondary | ICD-10-CM | POA: Diagnosis not present

## 2018-06-17 DIAGNOSIS — I110 Hypertensive heart disease with heart failure: Secondary | ICD-10-CM | POA: Diagnosis not present

## 2018-06-17 DIAGNOSIS — I25119 Atherosclerotic heart disease of native coronary artery with unspecified angina pectoris: Secondary | ICD-10-CM | POA: Diagnosis not present

## 2018-07-01 ENCOUNTER — Encounter (HOSPITAL_BASED_OUTPATIENT_CLINIC_OR_DEPARTMENT_OTHER): Payer: Medicare HMO | Attending: Internal Medicine

## 2018-07-01 DIAGNOSIS — E11622 Type 2 diabetes mellitus with other skin ulcer: Secondary | ICD-10-CM | POA: Insufficient documentation

## 2018-07-01 DIAGNOSIS — L97222 Non-pressure chronic ulcer of left calf with fat layer exposed: Secondary | ICD-10-CM | POA: Insufficient documentation

## 2018-07-01 DIAGNOSIS — E1140 Type 2 diabetes mellitus with diabetic neuropathy, unspecified: Secondary | ICD-10-CM | POA: Diagnosis not present

## 2018-07-01 DIAGNOSIS — G4730 Sleep apnea, unspecified: Secondary | ICD-10-CM | POA: Insufficient documentation

## 2018-07-01 DIAGNOSIS — I252 Old myocardial infarction: Secondary | ICD-10-CM | POA: Diagnosis not present

## 2018-07-01 DIAGNOSIS — I5031 Acute diastolic (congestive) heart failure: Secondary | ICD-10-CM | POA: Diagnosis not present

## 2018-07-01 DIAGNOSIS — I110 Hypertensive heart disease with heart failure: Secondary | ICD-10-CM | POA: Insufficient documentation

## 2018-07-01 DIAGNOSIS — L97322 Non-pressure chronic ulcer of left ankle with fat layer exposed: Secondary | ICD-10-CM | POA: Insufficient documentation

## 2018-07-01 DIAGNOSIS — S91002A Unspecified open wound, left ankle, initial encounter: Secondary | ICD-10-CM | POA: Diagnosis not present

## 2018-07-01 DIAGNOSIS — I2510 Atherosclerotic heart disease of native coronary artery without angina pectoris: Secondary | ICD-10-CM | POA: Insufficient documentation

## 2018-07-01 DIAGNOSIS — S81802A Unspecified open wound, left lower leg, initial encounter: Secondary | ICD-10-CM | POA: Diagnosis not present

## 2018-07-08 DIAGNOSIS — N312 Flaccid neuropathic bladder, not elsewhere classified: Secondary | ICD-10-CM | POA: Diagnosis not present

## 2018-07-14 ENCOUNTER — Other Ambulatory Visit: Payer: Self-pay | Admitting: Family Medicine

## 2018-07-14 DIAGNOSIS — I130 Hypertensive heart and chronic kidney disease with heart failure and stage 1 through stage 4 chronic kidney disease, or unspecified chronic kidney disease: Secondary | ICD-10-CM

## 2018-07-22 DIAGNOSIS — G4730 Sleep apnea, unspecified: Secondary | ICD-10-CM | POA: Diagnosis not present

## 2018-07-22 DIAGNOSIS — L97222 Non-pressure chronic ulcer of left calf with fat layer exposed: Secondary | ICD-10-CM | POA: Diagnosis not present

## 2018-07-22 DIAGNOSIS — S81802A Unspecified open wound, left lower leg, initial encounter: Secondary | ICD-10-CM | POA: Diagnosis not present

## 2018-07-22 DIAGNOSIS — I252 Old myocardial infarction: Secondary | ICD-10-CM | POA: Diagnosis not present

## 2018-07-22 DIAGNOSIS — E1140 Type 2 diabetes mellitus with diabetic neuropathy, unspecified: Secondary | ICD-10-CM | POA: Diagnosis not present

## 2018-07-22 DIAGNOSIS — S91002A Unspecified open wound, left ankle, initial encounter: Secondary | ICD-10-CM | POA: Diagnosis not present

## 2018-07-22 DIAGNOSIS — I110 Hypertensive heart disease with heart failure: Secondary | ICD-10-CM | POA: Diagnosis not present

## 2018-07-22 DIAGNOSIS — E11622 Type 2 diabetes mellitus with other skin ulcer: Secondary | ICD-10-CM | POA: Diagnosis not present

## 2018-07-22 DIAGNOSIS — I5031 Acute diastolic (congestive) heart failure: Secondary | ICD-10-CM | POA: Diagnosis not present

## 2018-07-22 DIAGNOSIS — I2510 Atherosclerotic heart disease of native coronary artery without angina pectoris: Secondary | ICD-10-CM | POA: Diagnosis not present

## 2018-07-22 DIAGNOSIS — L97322 Non-pressure chronic ulcer of left ankle with fat layer exposed: Secondary | ICD-10-CM | POA: Diagnosis not present

## 2018-08-04 ENCOUNTER — Other Ambulatory Visit: Payer: Self-pay | Admitting: Family Medicine

## 2018-08-05 ENCOUNTER — Encounter (HOSPITAL_BASED_OUTPATIENT_CLINIC_OR_DEPARTMENT_OTHER): Payer: Medicare HMO | Attending: Internal Medicine

## 2018-08-05 DIAGNOSIS — I110 Hypertensive heart disease with heart failure: Secondary | ICD-10-CM | POA: Insufficient documentation

## 2018-08-05 DIAGNOSIS — I252 Old myocardial infarction: Secondary | ICD-10-CM | POA: Diagnosis not present

## 2018-08-05 DIAGNOSIS — E1140 Type 2 diabetes mellitus with diabetic neuropathy, unspecified: Secondary | ICD-10-CM | POA: Insufficient documentation

## 2018-08-05 DIAGNOSIS — N312 Flaccid neuropathic bladder, not elsewhere classified: Secondary | ICD-10-CM | POA: Diagnosis not present

## 2018-08-05 DIAGNOSIS — L97222 Non-pressure chronic ulcer of left calf with fat layer exposed: Secondary | ICD-10-CM | POA: Diagnosis not present

## 2018-08-05 DIAGNOSIS — I2510 Atherosclerotic heart disease of native coronary artery without angina pectoris: Secondary | ICD-10-CM | POA: Diagnosis not present

## 2018-08-05 DIAGNOSIS — L299 Pruritus, unspecified: Secondary | ICD-10-CM | POA: Diagnosis not present

## 2018-08-05 DIAGNOSIS — I509 Heart failure, unspecified: Secondary | ICD-10-CM | POA: Diagnosis not present

## 2018-08-05 DIAGNOSIS — L97822 Non-pressure chronic ulcer of other part of left lower leg with fat layer exposed: Secondary | ICD-10-CM | POA: Diagnosis not present

## 2018-08-05 DIAGNOSIS — L539 Erythematous condition, unspecified: Secondary | ICD-10-CM | POA: Diagnosis not present

## 2018-08-05 DIAGNOSIS — G4730 Sleep apnea, unspecified: Secondary | ICD-10-CM | POA: Insufficient documentation

## 2018-08-12 ENCOUNTER — Encounter: Payer: Self-pay | Admitting: Family Medicine

## 2018-08-12 ENCOUNTER — Other Ambulatory Visit: Payer: Self-pay

## 2018-08-12 ENCOUNTER — Ambulatory Visit (INDEPENDENT_AMBULATORY_CARE_PROVIDER_SITE_OTHER): Payer: Medicare HMO | Admitting: Family Medicine

## 2018-08-12 VITALS — HR 78 | Resp 12

## 2018-08-12 DIAGNOSIS — Z794 Long term (current) use of insulin: Secondary | ICD-10-CM | POA: Diagnosis not present

## 2018-08-12 DIAGNOSIS — E1121 Type 2 diabetes mellitus with diabetic nephropathy: Secondary | ICD-10-CM

## 2018-08-12 DIAGNOSIS — N183 Chronic kidney disease, stage 3 unspecified: Secondary | ICD-10-CM

## 2018-08-12 DIAGNOSIS — E785 Hyperlipidemia, unspecified: Secondary | ICD-10-CM

## 2018-08-12 DIAGNOSIS — I130 Hypertensive heart and chronic kidney disease with heart failure and stage 1 through stage 4 chronic kidney disease, or unspecified chronic kidney disease: Secondary | ICD-10-CM

## 2018-08-12 DIAGNOSIS — F320 Major depressive disorder, single episode, mild: Secondary | ICD-10-CM

## 2018-08-12 MED ORDER — BLOOD PRESSURE MONITOR AUTOMAT DEVI: 0 refills | Status: AC

## 2018-08-12 MED ORDER — INSULIN GLARGINE 100 UNIT/ML SOLOSTAR PEN: 30.0000 [IU] | PEN_INJECTOR | Freq: Every day | SUBCUTANEOUS | 2 refills | Status: DC

## 2018-08-12 NOTE — Assessment & Plan Note (Signed)
Based on reported BS, problem seems to be better controlled. Recommend checking postprandial glucose. No changes in Lantus 30 units daily. Future A1c order placed, so he can be done with next blood work (09/09/2018). Continue periodic eye exam and use foot/lower extremity care.
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
## 2018-08-12 NOTE — Assessment & Plan Note (Signed)
Problem is well controlled. No changes in Remeron 15 mg at bedtime. Follow-up in 6 months.

## 2018-08-12 NOTE — Assessment & Plan Note (Signed)
Prescription for BP monitor sent to his pharmacy. Recommend monitoring BP regularly. No changes in current management. Continue low-salt diet.

## 2018-08-12 NOTE — Assessment & Plan Note (Signed)
No changes in Crestor 40 mg daily. Continue low fat diet. Will plan on checking FLP with next lab work.

## 2018-08-12 NOTE — Assessment & Plan Note (Signed)
Problem is stable. Adequate hydration. Continue low-salt diet. Continue avoiding NSAIDs. Adequate BP and glucose control. Following with nephrologist.

## 2018-08-19 ENCOUNTER — Telehealth (HOSPITAL_COMMUNITY): Payer: Self-pay | Admitting: Vascular Surgery

## 2018-08-19 NOTE — Telephone Encounter (Signed)
Spoke to pt daughter about 5/19 appt, daughter would like pt to be put on list to call when office reopens, pt daughter also stated that pt had labs done @ PCP office. pcp wants pt to come in HF for more labs, PCP sent labs to HF clinic per pt daughter to be reviewed, she would like nurse to call pt to set up labs for pt .please advise

## 2018-08-26 ENCOUNTER — Encounter (HOSPITAL_BASED_OUTPATIENT_CLINIC_OR_DEPARTMENT_OTHER): Payer: Medicare HMO | Attending: Internal Medicine

## 2018-08-26 DIAGNOSIS — G4730 Sleep apnea, unspecified: Secondary | ICD-10-CM | POA: Diagnosis not present

## 2018-08-26 DIAGNOSIS — I110 Hypertensive heart disease with heart failure: Secondary | ICD-10-CM | POA: Diagnosis not present

## 2018-08-26 DIAGNOSIS — I509 Heart failure, unspecified: Secondary | ICD-10-CM | POA: Insufficient documentation

## 2018-08-26 DIAGNOSIS — L97222 Non-pressure chronic ulcer of left calf with fat layer exposed: Secondary | ICD-10-CM | POA: Diagnosis not present

## 2018-08-26 DIAGNOSIS — I2510 Atherosclerotic heart disease of native coronary artery without angina pectoris: Secondary | ICD-10-CM | POA: Insufficient documentation

## 2018-08-26 DIAGNOSIS — I252 Old myocardial infarction: Secondary | ICD-10-CM | POA: Diagnosis not present

## 2018-08-26 DIAGNOSIS — L97322 Non-pressure chronic ulcer of left ankle with fat layer exposed: Secondary | ICD-10-CM | POA: Diagnosis not present

## 2018-08-26 DIAGNOSIS — E11622 Type 2 diabetes mellitus with other skin ulcer: Secondary | ICD-10-CM | POA: Insufficient documentation

## 2018-08-26 DIAGNOSIS — L97422 Non-pressure chronic ulcer of left heel and midfoot with fat layer exposed: Secondary | ICD-10-CM | POA: Diagnosis not present

## 2018-08-26 DIAGNOSIS — E1140 Type 2 diabetes mellitus with diabetic neuropathy, unspecified: Secondary | ICD-10-CM | POA: Insufficient documentation

## 2018-08-26 DIAGNOSIS — L539 Erythematous condition, unspecified: Secondary | ICD-10-CM | POA: Diagnosis not present

## 2018-09-02 DIAGNOSIS — L97322 Non-pressure chronic ulcer of left ankle with fat layer exposed: Secondary | ICD-10-CM | POA: Diagnosis not present

## 2018-09-02 DIAGNOSIS — E1140 Type 2 diabetes mellitus with diabetic neuropathy, unspecified: Secondary | ICD-10-CM | POA: Diagnosis not present

## 2018-09-02 DIAGNOSIS — E11622 Type 2 diabetes mellitus with other skin ulcer: Secondary | ICD-10-CM | POA: Diagnosis not present

## 2018-09-02 DIAGNOSIS — N312 Flaccid neuropathic bladder, not elsewhere classified: Secondary | ICD-10-CM | POA: Diagnosis not present

## 2018-09-02 DIAGNOSIS — I509 Heart failure, unspecified: Secondary | ICD-10-CM | POA: Diagnosis not present

## 2018-09-02 DIAGNOSIS — I252 Old myocardial infarction: Secondary | ICD-10-CM | POA: Diagnosis not present

## 2018-09-02 DIAGNOSIS — L97222 Non-pressure chronic ulcer of left calf with fat layer exposed: Secondary | ICD-10-CM | POA: Diagnosis not present

## 2018-09-02 DIAGNOSIS — I2510 Atherosclerotic heart disease of native coronary artery without angina pectoris: Secondary | ICD-10-CM | POA: Diagnosis not present

## 2018-09-02 DIAGNOSIS — S81802A Unspecified open wound, left lower leg, initial encounter: Secondary | ICD-10-CM | POA: Diagnosis not present

## 2018-09-02 DIAGNOSIS — L97522 Non-pressure chronic ulcer of other part of left foot with fat layer exposed: Secondary | ICD-10-CM | POA: Diagnosis not present

## 2018-09-02 DIAGNOSIS — L539 Erythematous condition, unspecified: Secondary | ICD-10-CM | POA: Diagnosis not present

## 2018-09-02 DIAGNOSIS — I110 Hypertensive heart disease with heart failure: Secondary | ICD-10-CM | POA: Diagnosis not present

## 2018-09-02 DIAGNOSIS — G4730 Sleep apnea, unspecified: Secondary | ICD-10-CM | POA: Diagnosis not present

## 2018-09-09 ENCOUNTER — Encounter (HOSPITAL_COMMUNITY): Payer: Medicare HMO | Admitting: Internal Medicine

## 2018-09-12 ENCOUNTER — Other Ambulatory Visit: Payer: Self-pay | Admitting: Family Medicine

## 2018-09-19 ENCOUNTER — Other Ambulatory Visit: Payer: Self-pay | Admitting: Family Medicine

## 2018-09-23 ENCOUNTER — Encounter (HOSPITAL_BASED_OUTPATIENT_CLINIC_OR_DEPARTMENT_OTHER): Payer: Medicare HMO | Attending: Internal Medicine

## 2018-09-23 DIAGNOSIS — E1140 Type 2 diabetes mellitus with diabetic neuropathy, unspecified: Secondary | ICD-10-CM | POA: Insufficient documentation

## 2018-09-23 DIAGNOSIS — I2510 Atherosclerotic heart disease of native coronary artery without angina pectoris: Secondary | ICD-10-CM | POA: Diagnosis not present

## 2018-09-23 DIAGNOSIS — L97922 Non-pressure chronic ulcer of unspecified part of left lower leg with fat layer exposed: Secondary | ICD-10-CM | POA: Insufficient documentation

## 2018-09-23 DIAGNOSIS — I110 Hypertensive heart disease with heart failure: Secondary | ICD-10-CM | POA: Insufficient documentation

## 2018-09-23 DIAGNOSIS — I252 Old myocardial infarction: Secondary | ICD-10-CM | POA: Insufficient documentation

## 2018-09-23 DIAGNOSIS — G4730 Sleep apnea, unspecified: Secondary | ICD-10-CM | POA: Insufficient documentation

## 2018-09-23 DIAGNOSIS — L97522 Non-pressure chronic ulcer of other part of left foot with fat layer exposed: Secondary | ICD-10-CM | POA: Diagnosis not present

## 2018-09-23 DIAGNOSIS — L97222 Non-pressure chronic ulcer of left calf with fat layer exposed: Secondary | ICD-10-CM | POA: Diagnosis not present

## 2018-09-23 DIAGNOSIS — E11622 Type 2 diabetes mellitus with other skin ulcer: Secondary | ICD-10-CM | POA: Diagnosis not present

## 2018-09-23 DIAGNOSIS — I509 Heart failure, unspecified: Secondary | ICD-10-CM | POA: Diagnosis not present

## 2018-10-05 IMAGING — US US RENAL
1 series · 14 of 25 positions shown · non-contrast
Comparison: None.

CLINICAL DATA: Acute renal failure

EXAM:
RENAL / URINARY TRACT ULTRASOUND COMPLETE

[Series 1: us renal · 0.28mm/px · 14 of 48 slices shown]
[im 1/48]
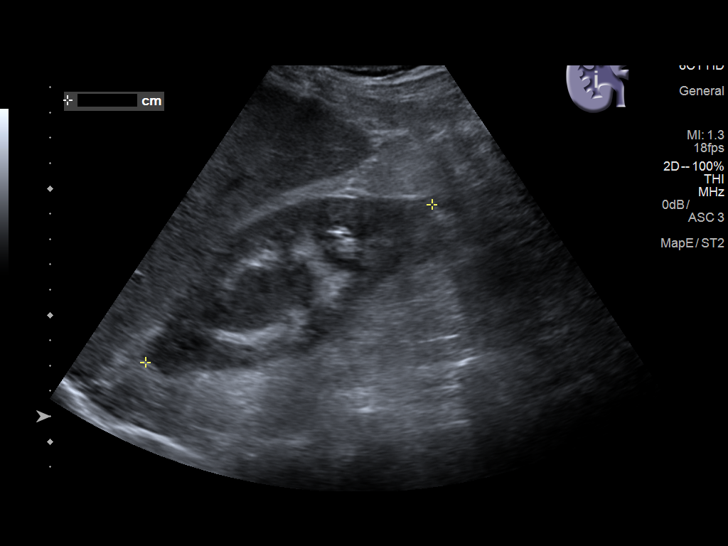
[im 4/48]
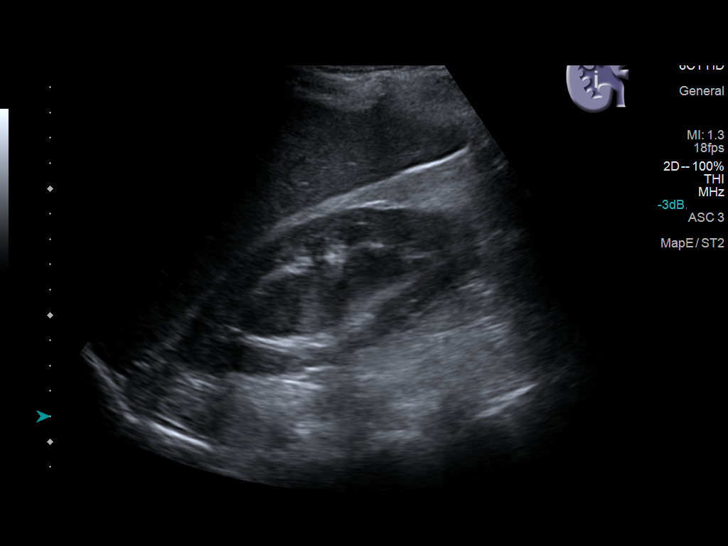
[im 8/48]
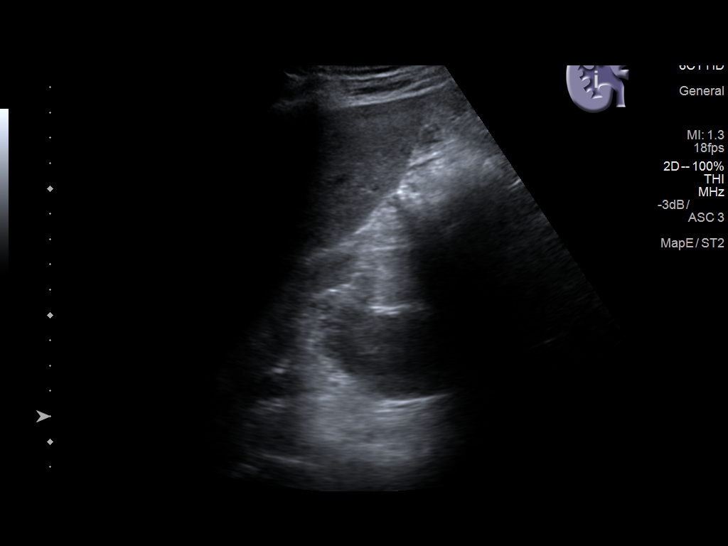
[im 12/48]
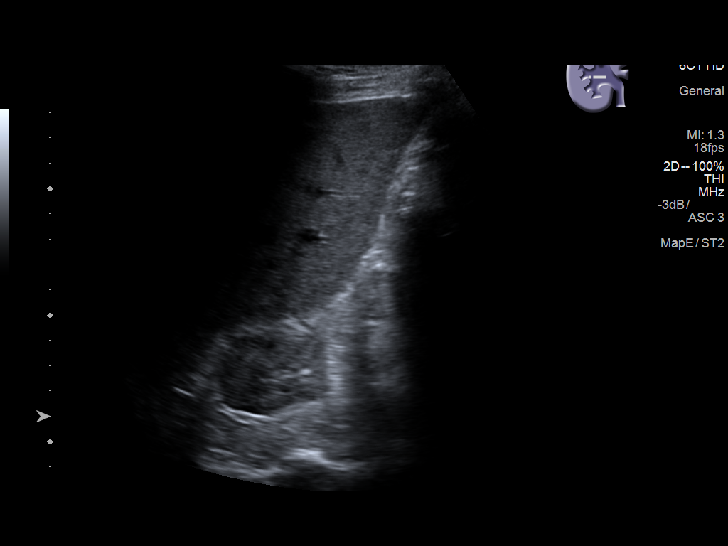
[im 16/48]
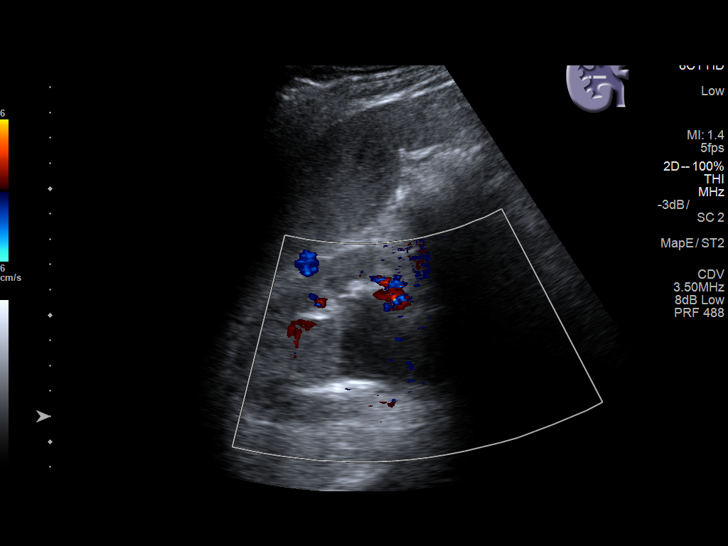
[im 18/48]
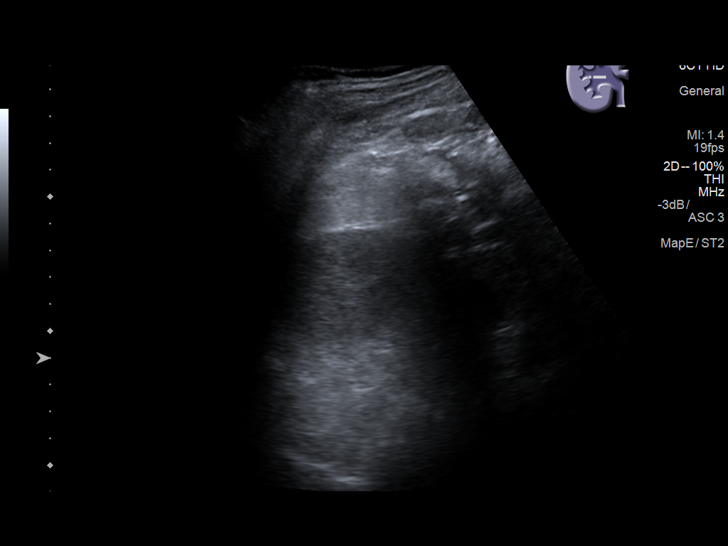
[im 22/48]
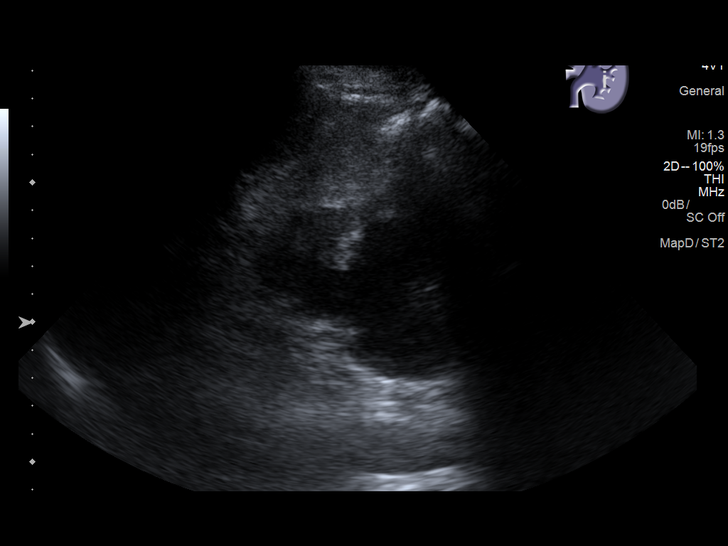
[im 26/48]
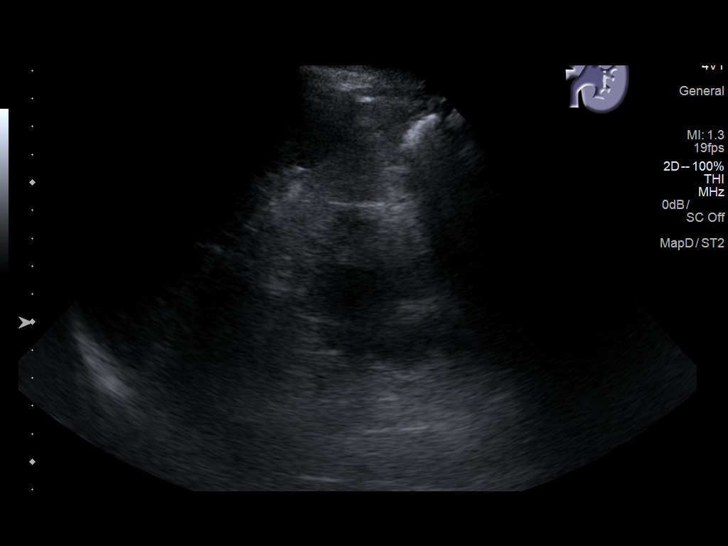
[im 30/48]
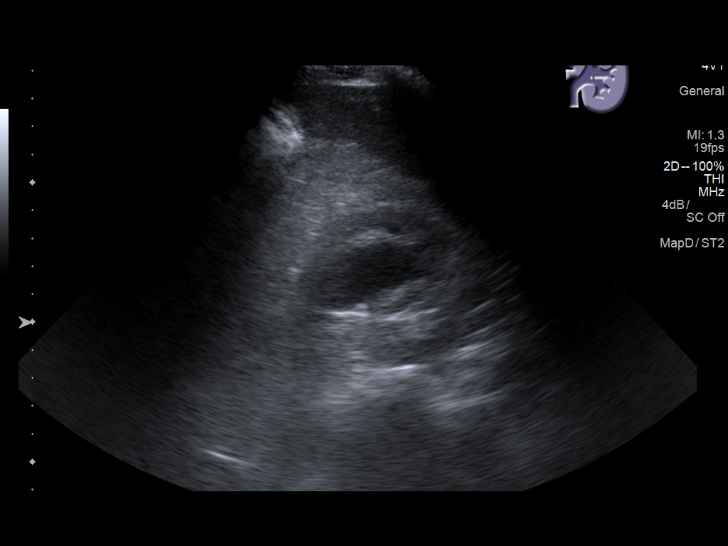
[im 32/48]
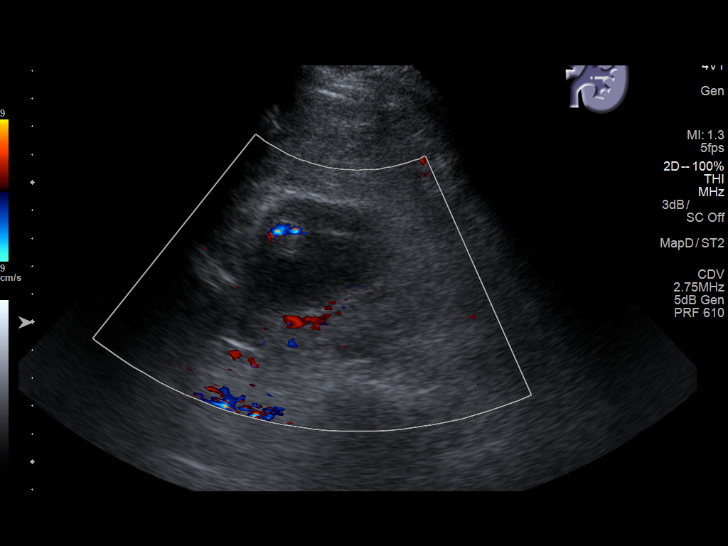
[im 36/48]
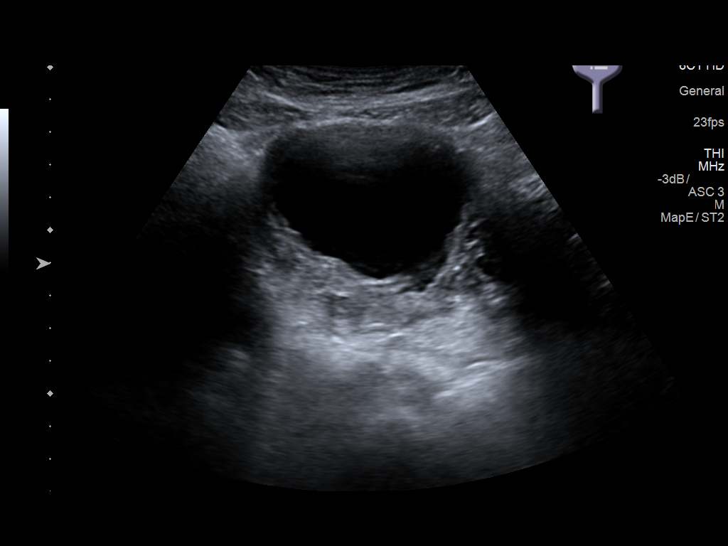
[im 40/48]
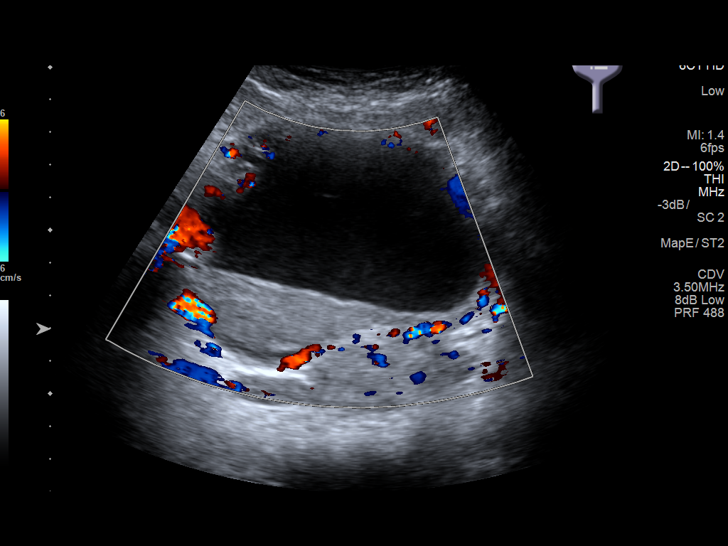
[im 44/48]
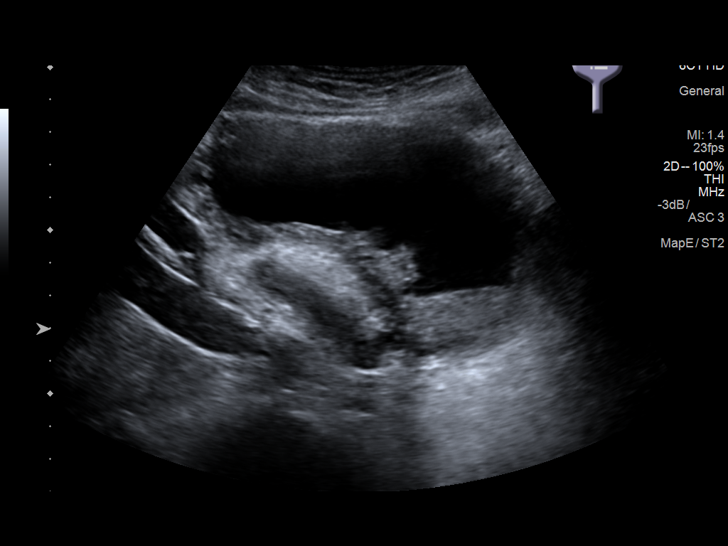
[im 48/48]
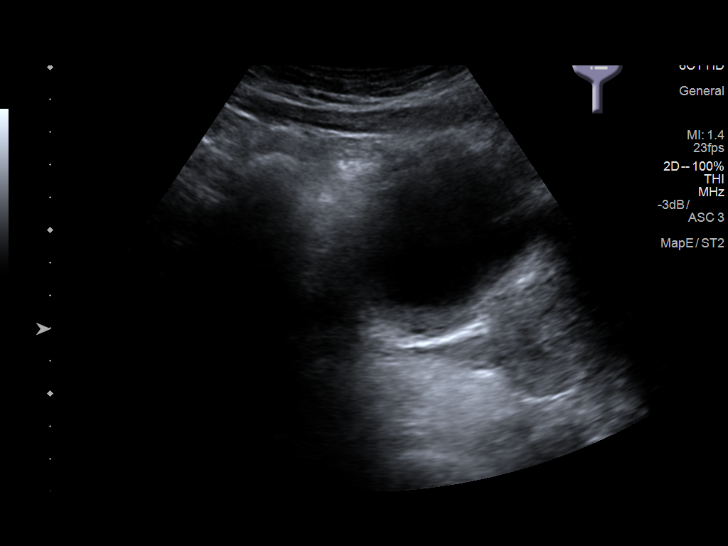

[14 of 25 positions shown; findings below may reference images not displayed]

FINDINGS: Right Kidney:

Length: 12.9 cm. Moderate hydronephrosis with some echogenic debris
in the central renal collecting system. No focal renal mass.

Left Kidney:

Length: 10.8 cm moderate hydronephrosis and proximal ureterectasis.
No focal parenchymal lesion..

Bladder:

Physiologically distended. Somewhat irregular wall thickening along
the posterior wall. Prostate 5.8 x 3.8 x 6 cm.
IMPRESSION: 1. Moderate bilateral hydronephrosis.
2. Mild prostatic enlargement with irregular asymmetric bladder wall
thickening. Consider urologic consultation to exclude neoplasm.

## 2018-10-07 DIAGNOSIS — I509 Heart failure, unspecified: Secondary | ICD-10-CM | POA: Diagnosis not present

## 2018-10-07 DIAGNOSIS — I252 Old myocardial infarction: Secondary | ICD-10-CM | POA: Diagnosis not present

## 2018-10-07 DIAGNOSIS — L97922 Non-pressure chronic ulcer of unspecified part of left lower leg with fat layer exposed: Secondary | ICD-10-CM | POA: Diagnosis not present

## 2018-10-07 DIAGNOSIS — N312 Flaccid neuropathic bladder, not elsewhere classified: Secondary | ICD-10-CM | POA: Diagnosis not present

## 2018-10-07 DIAGNOSIS — S81802A Unspecified open wound, left lower leg, initial encounter: Secondary | ICD-10-CM | POA: Diagnosis not present

## 2018-10-07 DIAGNOSIS — S91302A Unspecified open wound, left foot, initial encounter: Secondary | ICD-10-CM | POA: Diagnosis not present

## 2018-10-07 DIAGNOSIS — I110 Hypertensive heart disease with heart failure: Secondary | ICD-10-CM | POA: Diagnosis not present

## 2018-10-07 DIAGNOSIS — E1140 Type 2 diabetes mellitus with diabetic neuropathy, unspecified: Secondary | ICD-10-CM | POA: Diagnosis not present

## 2018-10-07 DIAGNOSIS — I2510 Atherosclerotic heart disease of native coronary artery without angina pectoris: Secondary | ICD-10-CM | POA: Diagnosis not present

## 2018-10-07 DIAGNOSIS — G4730 Sleep apnea, unspecified: Secondary | ICD-10-CM | POA: Diagnosis not present

## 2018-10-07 DIAGNOSIS — E11621 Type 2 diabetes mellitus with foot ulcer: Secondary | ICD-10-CM | POA: Diagnosis not present

## 2018-10-07 DIAGNOSIS — L97522 Non-pressure chronic ulcer of other part of left foot with fat layer exposed: Secondary | ICD-10-CM | POA: Diagnosis not present

## 2018-10-08 ENCOUNTER — Other Ambulatory Visit: Payer: Self-pay | Admitting: Family Medicine

## 2018-10-13 ENCOUNTER — Other Ambulatory Visit (HOSPITAL_COMMUNITY): Payer: Self-pay | Admitting: Internal Medicine

## 2018-10-13 ENCOUNTER — Telehealth (HOSPITAL_COMMUNITY): Payer: Self-pay | Admitting: Rehabilitation

## 2018-10-13 DIAGNOSIS — L97221 Non-pressure chronic ulcer of left calf limited to breakdown of skin: Secondary | ICD-10-CM

## 2018-10-13 NOTE — Telephone Encounter (Signed)

## 2018-10-14 ENCOUNTER — Other Ambulatory Visit: Payer: Self-pay

## 2018-10-14 ENCOUNTER — Ambulatory Visit (INDEPENDENT_AMBULATORY_CARE_PROVIDER_SITE_OTHER)
Admission: RE | Admit: 2018-10-14 | Discharge: 2018-10-14 | Disposition: A | Discharge status: Home / Self Care | Payer: Medicare HMO | Source: Ambulatory Visit | Attending: Family | Admitting: Family

## 2018-10-14 ENCOUNTER — Ambulatory Visit (HOSPITAL_COMMUNITY)
Admission: RE | Admit: 2018-10-14 | Discharge: 2018-10-14 | Disposition: A | Discharge status: Home / Self Care | Payer: Medicare HMO | Source: Ambulatory Visit | Attending: Family | Admitting: Family

## 2018-10-14 ENCOUNTER — Other Ambulatory Visit (HOSPITAL_COMMUNITY): Payer: Self-pay | Admitting: Internal Medicine

## 2018-10-14 DIAGNOSIS — L97221 Non-pressure chronic ulcer of left calf limited to breakdown of skin: Secondary | ICD-10-CM | POA: Diagnosis not present

## 2018-10-21 DIAGNOSIS — I2510 Atherosclerotic heart disease of native coronary artery without angina pectoris: Secondary | ICD-10-CM | POA: Diagnosis not present

## 2018-10-21 DIAGNOSIS — I110 Hypertensive heart disease with heart failure: Secondary | ICD-10-CM | POA: Diagnosis not present

## 2018-10-21 DIAGNOSIS — L97522 Non-pressure chronic ulcer of other part of left foot with fat layer exposed: Secondary | ICD-10-CM | POA: Diagnosis not present

## 2018-10-21 DIAGNOSIS — L97222 Non-pressure chronic ulcer of left calf with fat layer exposed: Secondary | ICD-10-CM | POA: Diagnosis not present

## 2018-10-21 DIAGNOSIS — L97922 Non-pressure chronic ulcer of unspecified part of left lower leg with fat layer exposed: Secondary | ICD-10-CM | POA: Diagnosis not present

## 2018-10-21 DIAGNOSIS — E1140 Type 2 diabetes mellitus with diabetic neuropathy, unspecified: Secondary | ICD-10-CM | POA: Diagnosis not present

## 2018-10-21 DIAGNOSIS — I252 Old myocardial infarction: Secondary | ICD-10-CM | POA: Diagnosis not present

## 2018-10-21 DIAGNOSIS — E11622 Type 2 diabetes mellitus with other skin ulcer: Secondary | ICD-10-CM | POA: Diagnosis not present

## 2018-10-21 DIAGNOSIS — I509 Heart failure, unspecified: Secondary | ICD-10-CM | POA: Diagnosis not present

## 2018-10-21 DIAGNOSIS — G4730 Sleep apnea, unspecified: Secondary | ICD-10-CM | POA: Diagnosis not present

## 2018-10-21 DIAGNOSIS — E11621 Type 2 diabetes mellitus with foot ulcer: Secondary | ICD-10-CM | POA: Diagnosis not present

## 2018-10-31 ENCOUNTER — Other Ambulatory Visit: Payer: Self-pay | Admitting: Family Medicine

## 2018-11-04 ENCOUNTER — Encounter (HOSPITAL_BASED_OUTPATIENT_CLINIC_OR_DEPARTMENT_OTHER): Payer: Medicare HMO | Attending: Internal Medicine

## 2018-11-04 DIAGNOSIS — N312 Flaccid neuropathic bladder, not elsewhere classified: Secondary | ICD-10-CM | POA: Diagnosis not present

## 2018-11-04 DIAGNOSIS — E11622 Type 2 diabetes mellitus with other skin ulcer: Secondary | ICD-10-CM | POA: Diagnosis not present

## 2018-11-04 DIAGNOSIS — E1140 Type 2 diabetes mellitus with diabetic neuropathy, unspecified: Secondary | ICD-10-CM | POA: Insufficient documentation

## 2018-11-04 DIAGNOSIS — G4730 Sleep apnea, unspecified: Secondary | ICD-10-CM | POA: Diagnosis not present

## 2018-11-04 DIAGNOSIS — I110 Hypertensive heart disease with heart failure: Secondary | ICD-10-CM | POA: Insufficient documentation

## 2018-11-04 DIAGNOSIS — L97522 Non-pressure chronic ulcer of other part of left foot with fat layer exposed: Secondary | ICD-10-CM | POA: Diagnosis not present

## 2018-11-04 DIAGNOSIS — E11621 Type 2 diabetes mellitus with foot ulcer: Secondary | ICD-10-CM | POA: Insufficient documentation

## 2018-11-04 DIAGNOSIS — L97222 Non-pressure chronic ulcer of left calf with fat layer exposed: Secondary | ICD-10-CM | POA: Diagnosis not present

## 2018-11-04 DIAGNOSIS — I2510 Atherosclerotic heart disease of native coronary artery without angina pectoris: Secondary | ICD-10-CM | POA: Insufficient documentation

## 2018-11-04 DIAGNOSIS — I509 Heart failure, unspecified: Secondary | ICD-10-CM | POA: Insufficient documentation

## 2018-11-04 DIAGNOSIS — I252 Old myocardial infarction: Secondary | ICD-10-CM | POA: Diagnosis not present

## 2018-11-18 DIAGNOSIS — G4730 Sleep apnea, unspecified: Secondary | ICD-10-CM | POA: Diagnosis not present

## 2018-11-18 DIAGNOSIS — L97222 Non-pressure chronic ulcer of left calf with fat layer exposed: Secondary | ICD-10-CM | POA: Diagnosis not present

## 2018-11-18 DIAGNOSIS — E11621 Type 2 diabetes mellitus with foot ulcer: Secondary | ICD-10-CM | POA: Diagnosis not present

## 2018-11-18 DIAGNOSIS — E11622 Type 2 diabetes mellitus with other skin ulcer: Secondary | ICD-10-CM | POA: Diagnosis not present

## 2018-11-18 DIAGNOSIS — I2510 Atherosclerotic heart disease of native coronary artery without angina pectoris: Secondary | ICD-10-CM | POA: Diagnosis not present

## 2018-11-18 DIAGNOSIS — I110 Hypertensive heart disease with heart failure: Secondary | ICD-10-CM | POA: Diagnosis not present

## 2018-11-18 DIAGNOSIS — I509 Heart failure, unspecified: Secondary | ICD-10-CM | POA: Diagnosis not present

## 2018-11-18 DIAGNOSIS — L97522 Non-pressure chronic ulcer of other part of left foot with fat layer exposed: Secondary | ICD-10-CM | POA: Diagnosis not present

## 2018-11-18 DIAGNOSIS — I252 Old myocardial infarction: Secondary | ICD-10-CM | POA: Diagnosis not present

## 2018-12-08 ENCOUNTER — Encounter (HOSPITAL_BASED_OUTPATIENT_CLINIC_OR_DEPARTMENT_OTHER): Payer: Medicare HMO

## 2018-12-09 ENCOUNTER — Encounter (HOSPITAL_BASED_OUTPATIENT_CLINIC_OR_DEPARTMENT_OTHER): Payer: Medicare HMO | Attending: Internal Medicine

## 2018-12-09 DIAGNOSIS — G4730 Sleep apnea, unspecified: Secondary | ICD-10-CM | POA: Insufficient documentation

## 2018-12-09 DIAGNOSIS — I110 Hypertensive heart disease with heart failure: Secondary | ICD-10-CM | POA: Diagnosis not present

## 2018-12-09 DIAGNOSIS — I2510 Atherosclerotic heart disease of native coronary artery without angina pectoris: Secondary | ICD-10-CM | POA: Diagnosis not present

## 2018-12-09 DIAGNOSIS — L97222 Non-pressure chronic ulcer of left calf with fat layer exposed: Secondary | ICD-10-CM | POA: Diagnosis not present

## 2018-12-09 DIAGNOSIS — I252 Old myocardial infarction: Secondary | ICD-10-CM | POA: Insufficient documentation

## 2018-12-09 DIAGNOSIS — E1140 Type 2 diabetes mellitus with diabetic neuropathy, unspecified: Secondary | ICD-10-CM | POA: Diagnosis not present

## 2018-12-09 DIAGNOSIS — I509 Heart failure, unspecified: Secondary | ICD-10-CM | POA: Diagnosis not present

## 2018-12-09 DIAGNOSIS — L97822 Non-pressure chronic ulcer of other part of left lower leg with fat layer exposed: Secondary | ICD-10-CM | POA: Insufficient documentation

## 2018-12-09 DIAGNOSIS — L97522 Non-pressure chronic ulcer of other part of left foot with fat layer exposed: Secondary | ICD-10-CM | POA: Diagnosis not present

## 2018-12-09 DIAGNOSIS — E11622 Type 2 diabetes mellitus with other skin ulcer: Secondary | ICD-10-CM | POA: Diagnosis not present

## 2018-12-09 DIAGNOSIS — N312 Flaccid neuropathic bladder, not elsewhere classified: Secondary | ICD-10-CM | POA: Diagnosis not present

## 2018-12-09 DIAGNOSIS — E11621 Type 2 diabetes mellitus with foot ulcer: Secondary | ICD-10-CM | POA: Diagnosis not present

## 2018-12-26 ENCOUNTER — Other Ambulatory Visit: Payer: Self-pay | Admitting: Family Medicine

## 2018-12-30 ENCOUNTER — Other Ambulatory Visit (HOSPITAL_COMMUNITY): Payer: Self-pay

## 2018-12-30 ENCOUNTER — Encounter (HOSPITAL_BASED_OUTPATIENT_CLINIC_OR_DEPARTMENT_OTHER): Payer: Medicare HMO | Attending: Internal Medicine

## 2018-12-30 DIAGNOSIS — I110 Hypertensive heart disease with heart failure: Secondary | ICD-10-CM | POA: Insufficient documentation

## 2018-12-30 DIAGNOSIS — G4730 Sleep apnea, unspecified: Secondary | ICD-10-CM | POA: Insufficient documentation

## 2018-12-30 DIAGNOSIS — L03032 Cellulitis of left toe: Secondary | ICD-10-CM | POA: Insufficient documentation

## 2018-12-30 DIAGNOSIS — I509 Heart failure, unspecified: Secondary | ICD-10-CM | POA: Insufficient documentation

## 2018-12-30 DIAGNOSIS — E11621 Type 2 diabetes mellitus with foot ulcer: Secondary | ICD-10-CM | POA: Insufficient documentation

## 2018-12-30 DIAGNOSIS — I2510 Atherosclerotic heart disease of native coronary artery without angina pectoris: Secondary | ICD-10-CM | POA: Insufficient documentation

## 2018-12-30 DIAGNOSIS — L97822 Non-pressure chronic ulcer of other part of left lower leg with fat layer exposed: Secondary | ICD-10-CM | POA: Insufficient documentation

## 2018-12-30 DIAGNOSIS — L97525 Non-pressure chronic ulcer of other part of left foot with muscle involvement without evidence of necrosis: Secondary | ICD-10-CM | POA: Insufficient documentation

## 2018-12-30 DIAGNOSIS — E1140 Type 2 diabetes mellitus with diabetic neuropathy, unspecified: Secondary | ICD-10-CM | POA: Insufficient documentation

## 2018-12-30 DIAGNOSIS — E11622 Type 2 diabetes mellitus with other skin ulcer: Secondary | ICD-10-CM | POA: Insufficient documentation

## 2018-12-30 MED ORDER — CARVEDILOL 3.125 MG PO TABS: 3.1250 mg | ORAL_TABLET | Freq: Two times a day (BID) | ORAL | 2 refills | Status: DC

## 2019-01-06 DIAGNOSIS — E1140 Type 2 diabetes mellitus with diabetic neuropathy, unspecified: Secondary | ICD-10-CM | POA: Diagnosis not present

## 2019-01-06 DIAGNOSIS — I509 Heart failure, unspecified: Secondary | ICD-10-CM | POA: Diagnosis not present

## 2019-01-06 DIAGNOSIS — E11622 Type 2 diabetes mellitus with other skin ulcer: Secondary | ICD-10-CM | POA: Diagnosis not present

## 2019-01-06 DIAGNOSIS — N312 Flaccid neuropathic bladder, not elsewhere classified: Secondary | ICD-10-CM | POA: Diagnosis not present

## 2019-01-06 DIAGNOSIS — I110 Hypertensive heart disease with heart failure: Secondary | ICD-10-CM | POA: Diagnosis not present

## 2019-01-06 DIAGNOSIS — E11621 Type 2 diabetes mellitus with foot ulcer: Secondary | ICD-10-CM | POA: Diagnosis not present

## 2019-01-06 DIAGNOSIS — L97222 Non-pressure chronic ulcer of left calf with fat layer exposed: Secondary | ICD-10-CM | POA: Diagnosis not present

## 2019-01-06 DIAGNOSIS — G4730 Sleep apnea, unspecified: Secondary | ICD-10-CM | POA: Diagnosis not present

## 2019-01-06 DIAGNOSIS — L97525 Non-pressure chronic ulcer of other part of left foot with muscle involvement without evidence of necrosis: Secondary | ICD-10-CM | POA: Diagnosis not present

## 2019-01-06 DIAGNOSIS — L97822 Non-pressure chronic ulcer of other part of left lower leg with fat layer exposed: Secondary | ICD-10-CM | POA: Diagnosis not present

## 2019-01-06 DIAGNOSIS — L03032 Cellulitis of left toe: Secondary | ICD-10-CM | POA: Diagnosis not present

## 2019-01-06 DIAGNOSIS — I2510 Atherosclerotic heart disease of native coronary artery without angina pectoris: Secondary | ICD-10-CM | POA: Diagnosis not present

## 2019-01-07 ENCOUNTER — Other Ambulatory Visit: Payer: Self-pay | Admitting: Family Medicine

## 2019-01-27 ENCOUNTER — Other Ambulatory Visit: Payer: Self-pay

## 2019-01-27 ENCOUNTER — Other Ambulatory Visit (HOSPITAL_COMMUNITY)
Admission: RE | Admit: 2019-01-27 | Discharge: 2019-01-27 | Disposition: A | Discharge status: Home / Self Care | Payer: Medicare HMO | Source: Other Acute Inpatient Hospital | Attending: Internal Medicine | Admitting: Internal Medicine

## 2019-01-27 ENCOUNTER — Encounter (HOSPITAL_BASED_OUTPATIENT_CLINIC_OR_DEPARTMENT_OTHER): Payer: Medicare HMO | Attending: Internal Medicine | Admitting: Internal Medicine

## 2019-01-27 DIAGNOSIS — L97222 Non-pressure chronic ulcer of left calf with fat layer exposed: Secondary | ICD-10-CM | POA: Insufficient documentation

## 2019-01-27 DIAGNOSIS — L97528 Non-pressure chronic ulcer of other part of left foot with other specified severity: Secondary | ICD-10-CM | POA: Diagnosis not present

## 2019-01-27 DIAGNOSIS — L97221 Non-pressure chronic ulcer of left calf limited to breakdown of skin: Secondary | ICD-10-CM | POA: Diagnosis not present

## 2019-01-27 DIAGNOSIS — I252 Old myocardial infarction: Secondary | ICD-10-CM | POA: Insufficient documentation

## 2019-01-27 DIAGNOSIS — I509 Heart failure, unspecified: Secondary | ICD-10-CM | POA: Diagnosis not present

## 2019-01-27 DIAGNOSIS — E1140 Type 2 diabetes mellitus with diabetic neuropathy, unspecified: Secondary | ICD-10-CM | POA: Insufficient documentation

## 2019-01-27 DIAGNOSIS — G4730 Sleep apnea, unspecified: Secondary | ICD-10-CM | POA: Insufficient documentation

## 2019-01-27 DIAGNOSIS — E11621 Type 2 diabetes mellitus with foot ulcer: Secondary | ICD-10-CM | POA: Insufficient documentation

## 2019-01-27 DIAGNOSIS — L97822 Non-pressure chronic ulcer of other part of left lower leg with fat layer exposed: Secondary | ICD-10-CM | POA: Insufficient documentation

## 2019-01-27 DIAGNOSIS — L97525 Non-pressure chronic ulcer of other part of left foot with muscle involvement without evidence of necrosis: Secondary | ICD-10-CM | POA: Diagnosis not present

## 2019-01-27 DIAGNOSIS — I2510 Atherosclerotic heart disease of native coronary artery without angina pectoris: Secondary | ICD-10-CM | POA: Diagnosis not present

## 2019-01-27 DIAGNOSIS — E11622 Type 2 diabetes mellitus with other skin ulcer: Secondary | ICD-10-CM | POA: Insufficient documentation

## 2019-01-27 DIAGNOSIS — L89892 Pressure ulcer of other site, stage 2: Secondary | ICD-10-CM | POA: Diagnosis not present

## 2019-01-27 DIAGNOSIS — I110 Hypertensive heart disease with heart failure: Secondary | ICD-10-CM | POA: Insufficient documentation

## 2019-01-27 DIAGNOSIS — L97522 Non-pressure chronic ulcer of other part of left foot with fat layer exposed: Secondary | ICD-10-CM | POA: Diagnosis not present

## 2019-01-27 NOTE — Progress Notes (Signed)
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 his arterial studies today these showed some tibial vessel disease however ABIs and TBI's were normal. He has [employer] we have not been able to get advanced  treatment products 9/15; 1 month follow-up. There is improvement in the left lateral calf along the lateral part of the tibia. This appears to be largely epithelialized some mild erythema around this but no pain ooHe has a new area in the left mid tibia which is a small wound he says was there last time but I do not know that we do find this may have been eschared over scab. ooHe has a paronychia I on the base of the left first toe with erythema. This is not painful but he is reasonably insensate ooFinally the area on the left dorsal ankle is mostly exposed tendon which ultimately going to need to be removed. ooWe have been using silver collagen in all wound areas 10/6; 3-week follow-up. There is been absolutely no improvement in the left lateral calf. This is a major deterioration from when he was here 3 weeks ago. In addition he has 2 superficial areas one above this and one below. The area over the dorsal ankle has necrotic tendon which I removed. 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 I did not think at that time that that anything to do with the upper left tibial wound. Objective Constitutional Vitals Time Taken: 9:44 AM, Height: 74 in, Weight: 150 lbs, BMI: 19.3, Temperature: 98.6 F, Pulse: 98 bpm, Respiratory Rate: 19 breaths/min, Blood Pressure: 110/67 mmHg, Capillary Blood Glucose: 115 mg/dl. General Notes: patient stated CBG this morning was 115 Integumentary (Hair, Skin) Wound #14 status is Open. Original cause of wound was Gradually Appeared. The wound is located on the Left,Lateral Lower Leg. The wound measures 7.5cm length x 2.5cm width x 0.3cm depth; 14.726cm^2 area and 4.418cm^3 volume. There is Fat Layer (Subcutaneous Tissue) Exposed exposed. There is no tunneling or undermining noted. There is a medium  amount of purulent drainage noted. The wound margin is flat and intact. There is large (67-100%) red, pink, hyper - granulation within the wound bed. There is a small (1-33%) amount of necrotic tissue within the wound bed including Adherent Slough. Wound #18 status is Open. Original cause of wound was Gradually Appeared. The wound is located on the Left,Dorsal Foot. The wound measures 3cm length x 2.7cm width x 0.3cm depth; 6.362cm^2 area and 1.909cm^3 volume. There is muscle, tendon, and Fat Layer (Subcutaneous Tissue) Exposed exposed. There is no tunneling or undermining noted. There is a medium amount of purulent drainage noted. The wound margin is well defined and not attached to the wound base. There is small (1-33%) pink granulation within the wound bed. There is a large (67- 100%) amount of necrotic tissue within the wound bed including Eschar, Adherent Slough and Necrosis of Muscle. Wound #19 status is Open. Original  cause of wound was Gradually Appeared. The wound is located on the Left,Anterior Lower Leg. The wound measures 2.5cm length x 2cm width x 0.1cm depth; 3.927cm^2 area and 0.393cm^3 volume. There is Fat Layer (Subcutaneous Tissue) Exposed exposed. There is no tunneling or undermining noted. There is a medium amount of purulent drainage noted. The wound margin is flat and intact. There is large (67-100%) red granulation within the wound bed. There is no necrotic tissue within the wound bed. Wound #20 status is Open. Original cause of wound was Trauma. The wound is located on the Left Toe Great. The wound measures 1.7cm length x 0.3cm width x 0.1cm depth; 0.401cm^2 area and 0.04cm^3 volume. There is Fat Layer (Subcutaneous Tissue) Exposed exposed. There is no tunneling or undermining noted. There is a small amount of purulent drainage noted. The wound margin is distinct with the outline attached to the wound base. There is large (67-100%) pink granulation within the wound bed.  There is no necrotic tissue within the wound bed. Wound #21 status is Open. Original cause of wound was Gradually Appeared. The wound is located on the [facility] Lower Leg. The wound measures 2.5cm length x 1cm width x 0.1cm depth; 1.963cm^2 area and 0.196cm^3 volume. There is Fat Layer (Subcutaneous Tissue) Exposed exposed. There is no tunneling or undermining noted. There is a small amount of purulent drainage noted. The wound margin is distinct with the outline attached to the wound base. There is large (67-100%) red, pink granulation within the wound bed. There is no necrotic tissue within the wound bed. 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 There was a Excisional Skin/Subcutaneous Tissue/Muscle Debridement with a total area of 8.1 sq cm performed by [name], MD. With the following instrument(s): Blade, and Curette to remove Viable and Non-Viable tissue/material. Material removed includes Tendon, Subcutaneous Tissue, and Slough after achieving pain control using Lidocaine 5% topical ointment. 1 specimen was taken by a Tissue Culture and sent to the lab per facility protocol. A time out was conducted at 10:33, prior to the start of the procedure. A Moderate amount of bleeding was controlled with Silver Nitrate. The procedure was tolerated well with a pain level of 0 throughout and a pain level of 0 following the procedure. Post Debridement Measurements: 3cm length x 2.7cm width x 0.3cm depth; 1.909cm^3 volume. Character of  Wound/Ulcer Post Debridement is improved. Severity of Tissue Post Debridement is: Fat layer exposed. Post procedure Diagnosis Wound #18: Same as Pre-Procedure Plan Follow-up Appointments: Return appointment in 3 weeks. Dressing Change Frequency: Change dressing three times week. Skin Barriers/Peri-Wound Care: Other: - TCA to left lower extrimity Wound Cleansing: Clean wound with Wound Cleanser May shower with protection. Primary Wound Dressing: Wound #14 Left,Lateral Lower Leg: Hydrofera Blue - Ready Wound #18 Left,Dorsal Foot: Hydrofera Blue - ready Wound #19 Left,Anterior Lower Leg: Hydrofera Blue - ready Wound #20 Left Toe Great: Other: - neosporin Wound #21 Left,Distal,Anterior Lower Leg: Hydrofera Blue - ready Secondary Dressing: Wound #14 Left,Lateral Lower Leg: Dry Gauze Wound #18 Left,Dorsal Foot: Dry Gauze Wound #19 Left,Anterior Lower Leg: Dry Gauze Wound #21  Left,Distal,Anterior Lower Leg: Dry Gauze Edema Control: Kerlix and Coban - Left Lower Extremity Avoid standing for long periods of time Elevate legs to the level of the heart or above for 30 minutes daily and/or when sitting, a frequency of: - throughout the day Laboratory ordered were: Anerobic culture - non healing wound 1. I put him back in the [facility] he is certainly not doing well with the collagen at least not at this time 2. Extensive debridement of the dorsal ankle to remove the surface necrotic tendon. He is essentially wheelchair- bound he does not ambulate. There was no way to heal this over this area. 3. There is a fair amount of bleeding with the ankle wound which led me to believe that the tibial vessels were probably adequate however I did look back at his arterial studies from June. 4. The left lateral tibial wound which is his major wound recently was epithelializing last time but certainly not at this time. There is nothing obvious about the wound itself. May consider a  biopsy Electronic Signature(s) Signed: 01/27/2019 5:29:39 PM By: Linton Ham MD Entered By: Linton Ham on 01/27/2019 12:45:11 -------------------------------------------------------------------------------- SuperBill Details Patient Name: Date of Service: [name] [name] Record 01/27/2019 Medical Record [id number] Patient Account Number: [id number] Date of Birth/Sex: Treating RN: 12-03-1945 (73 y.o. Staci Acosta, Morey Hummingbird Primary Care Provider: [name], [name] Other Clinician: Sandre Kitty Referring Provider: Treating Provider/Extender:[name], [name] [name], [name] in Treatment: 145 Diagnosis Coding ICD-10 Codes Code Description [phone] Non-pressure chronic ulcer of left calf limited to breakdown of skin L97.321 Non-pressure chronic ulcer of left ankle limited to breakdown of skin L03.032 Cellulitis of left toe L97.821 Non-pressure chronic ulcer of other part of left lower leg limited to breakdown of skin Facility Procedures CPT4 Code Description: [id number] - DEB MUSC/FASCIA 20 SQ CM/< ICD-10 Diagnosis Description L97.321 Non-pressure chronic ulcer of left ankle limited to breakdown Modifier: of skin Quantity: 1 Physician Procedures CPT4 Code Description: [id number] - WC PHYS DEBR MUSCLE/FASCIA 20 SQ CM ICD-10 Diagnosis Description [id number] Non-pressure chronic ulcer of left ankle limited to breakdown Modifier: of skin Quantity: 1 Electronic Signature(s) Signed: 01/27/2019 5:29:39 PM By: Linton Ham MD Entered By: Linton Ham on 01/27/2019 12:46:41

## 2019-01-30 LAB — AEROBIC CULTURE? (SUPERFICIAL SPECIMEN)

## 2019-01-30 LAB — AEROBIC CULTURE W GRAM STAIN (SUPERFICIAL SPECIMEN)

## 2019-02-03 DIAGNOSIS — E11621 Type 2 diabetes mellitus with foot ulcer: Secondary | ICD-10-CM | POA: Diagnosis not present

## 2019-02-03 DIAGNOSIS — N312 Flaccid neuropathic bladder, not elsewhere classified: Secondary | ICD-10-CM | POA: Diagnosis not present

## 2019-02-05 DIAGNOSIS — E1129 Type 2 diabetes mellitus with other diabetic kidney complication: Secondary | ICD-10-CM | POA: Diagnosis not present

## 2019-02-05 DIAGNOSIS — N189 Chronic kidney disease, unspecified: Secondary | ICD-10-CM | POA: Diagnosis not present

## 2019-02-05 DIAGNOSIS — I509 Heart failure, unspecified: Secondary | ICD-10-CM | POA: Diagnosis not present

## 2019-02-05 DIAGNOSIS — I129 Hypertensive chronic kidney disease with stage 1 through stage 4 chronic kidney disease, or unspecified chronic kidney disease: Secondary | ICD-10-CM | POA: Diagnosis not present

## 2019-02-13 ENCOUNTER — Other Ambulatory Visit: Payer: Self-pay | Admitting: Family Medicine

## 2019-02-13 DIAGNOSIS — I130 Hypertensive heart and chronic kidney disease with heart failure and stage 1 through stage 4 chronic kidney disease, or unspecified chronic kidney disease: Secondary | ICD-10-CM

## 2019-02-17 ENCOUNTER — Other Ambulatory Visit: Payer: Self-pay

## 2019-02-17 ENCOUNTER — Encounter (HOSPITAL_BASED_OUTPATIENT_CLINIC_OR_DEPARTMENT_OTHER): Payer: Medicare HMO | Admitting: Internal Medicine

## 2019-02-17 DIAGNOSIS — L97525 Non-pressure chronic ulcer of other part of left foot with muscle involvement without evidence of necrosis: Secondary | ICD-10-CM | POA: Diagnosis not present

## 2019-02-17 DIAGNOSIS — L97822 Non-pressure chronic ulcer of other part of left lower leg with fat layer exposed: Secondary | ICD-10-CM | POA: Diagnosis not present

## 2019-02-17 DIAGNOSIS — L89892 Pressure ulcer of other site, stage 2: Secondary | ICD-10-CM | POA: Diagnosis not present

## 2019-02-17 DIAGNOSIS — E11621 Type 2 diabetes mellitus with foot ulcer: Secondary | ICD-10-CM | POA: Diagnosis not present

## 2019-02-17 DIAGNOSIS — L97528 Non-pressure chronic ulcer of other part of left foot with other specified severity: Secondary | ICD-10-CM | POA: Diagnosis not present

## 2019-02-17 DIAGNOSIS — I110 Hypertensive heart disease with heart failure: Secondary | ICD-10-CM | POA: Diagnosis not present

## 2019-02-17 DIAGNOSIS — E11622 Type 2 diabetes mellitus with other skin ulcer: Secondary | ICD-10-CM | POA: Diagnosis not present

## 2019-02-17 DIAGNOSIS — G4730 Sleep apnea, unspecified: Secondary | ICD-10-CM | POA: Diagnosis not present

## 2019-02-17 DIAGNOSIS — I509 Heart failure, unspecified: Secondary | ICD-10-CM | POA: Diagnosis not present

## 2019-02-17 DIAGNOSIS — L97222 Non-pressure chronic ulcer of left calf with fat layer exposed: Secondary | ICD-10-CM | POA: Diagnosis not present

## 2019-02-18 ENCOUNTER — Other Ambulatory Visit: Payer: Self-pay | Admitting: Family Medicine

## 2019-03-03 ENCOUNTER — Encounter (HOSPITAL_COMMUNITY): Payer: Medicare HMO | Admitting: Internal Medicine

## 2019-03-09 ENCOUNTER — Other Ambulatory Visit: Payer: Self-pay | Admitting: Family Medicine

## 2019-03-10 ENCOUNTER — Encounter (HOSPITAL_BASED_OUTPATIENT_CLINIC_OR_DEPARTMENT_OTHER): Payer: Medicare HMO | Attending: Internal Medicine | Admitting: Internal Medicine

## 2019-03-10 ENCOUNTER — Other Ambulatory Visit: Payer: Self-pay

## 2019-03-10 DIAGNOSIS — I5040 Unspecified combined systolic (congestive) and diastolic (congestive) heart failure: Secondary | ICD-10-CM | POA: Insufficient documentation

## 2019-03-10 DIAGNOSIS — Z95810 Presence of automatic (implantable) cardiac defibrillator: Secondary | ICD-10-CM | POA: Insufficient documentation

## 2019-03-10 DIAGNOSIS — N183 Chronic kidney disease, stage 3 unspecified: Secondary | ICD-10-CM | POA: Diagnosis not present

## 2019-03-10 DIAGNOSIS — I252 Old myocardial infarction: Secondary | ICD-10-CM | POA: Insufficient documentation

## 2019-03-10 DIAGNOSIS — L97522 Non-pressure chronic ulcer of other part of left foot with fat layer exposed: Secondary | ICD-10-CM | POA: Diagnosis not present

## 2019-03-10 DIAGNOSIS — E1142 Type 2 diabetes mellitus with diabetic polyneuropathy: Secondary | ICD-10-CM | POA: Insufficient documentation

## 2019-03-10 DIAGNOSIS — L97821 Non-pressure chronic ulcer of other part of left lower leg limited to breakdown of skin: Secondary | ICD-10-CM | POA: Insufficient documentation

## 2019-03-10 DIAGNOSIS — I130 Hypertensive heart and chronic kidney disease with heart failure and stage 1 through stage 4 chronic kidney disease, or unspecified chronic kidney disease: Secondary | ICD-10-CM | POA: Diagnosis not present

## 2019-03-10 DIAGNOSIS — E11621 Type 2 diabetes mellitus with foot ulcer: Secondary | ICD-10-CM | POA: Diagnosis not present

## 2019-03-10 DIAGNOSIS — L97521 Non-pressure chronic ulcer of other part of left foot limited to breakdown of skin: Secondary | ICD-10-CM | POA: Insufficient documentation

## 2019-03-10 DIAGNOSIS — N312 Flaccid neuropathic bladder, not elsewhere classified: Secondary | ICD-10-CM | POA: Diagnosis not present

## 2019-03-10 DIAGNOSIS — E1122 Type 2 diabetes mellitus with diabetic chronic kidney disease: Secondary | ICD-10-CM | POA: Diagnosis not present

## 2019-03-10 DIAGNOSIS — L97221 Non-pressure chronic ulcer of left calf limited to breakdown of skin: Secondary | ICD-10-CM | POA: Insufficient documentation

## 2019-03-10 DIAGNOSIS — E1165 Type 2 diabetes mellitus with hyperglycemia: Secondary | ICD-10-CM | POA: Diagnosis not present

## 2019-03-10 DIAGNOSIS — I5082 Biventricular heart failure: Secondary | ICD-10-CM | POA: Insufficient documentation

## 2019-03-10 DIAGNOSIS — E11622 Type 2 diabetes mellitus with other skin ulcer: Secondary | ICD-10-CM | POA: Diagnosis not present

## 2019-03-10 DIAGNOSIS — L97321 Non-pressure chronic ulcer of left ankle limited to breakdown of skin: Secondary | ICD-10-CM | POA: Insufficient documentation

## 2019-03-10 DIAGNOSIS — I2510 Atherosclerotic heart disease of native coronary artery without angina pectoris: Secondary | ICD-10-CM | POA: Insufficient documentation

## 2019-03-10 DIAGNOSIS — Z8673 Personal history of transient ischemic attack (TIA), and cerebral infarction without residual deficits: Secondary | ICD-10-CM | POA: Insufficient documentation

## 2019-03-10 DIAGNOSIS — G4730 Sleep apnea, unspecified: Secondary | ICD-10-CM | POA: Insufficient documentation

## 2019-03-11 DIAGNOSIS — E11621 Type 2 diabetes mellitus with foot ulcer: Secondary | ICD-10-CM | POA: Diagnosis not present

## 2019-03-17 ENCOUNTER — Telehealth (HOSPITAL_COMMUNITY): Payer: Self-pay

## 2019-03-17 NOTE — Telephone Encounter (Signed)
COVID-19 pre-appointment screening questions: °  °Do you have a history of COVID-19 or a positive test result in the past 7-10 days? NO ° °To the best of your knowledge, have you been in close contact with anyone with a confirmed diagnosis of COVID 19? NO ° °Have you had any one or more of the following: Fever, chills, cough, shortness of breath (out of the normal for you) or any flu-like symptoms? NO ° °Are you experiencing any of the following symptoms that is new or out of usual for you: NO ° °• Ear, nose or throat discomfort °• Sore throat °• Headache °• Muscle Pain °• Diarrhea °• Loss of taste or smell ° ° °Reviewed all the following with patient: °• Use of hand sanitizer when entering the building °• Everyone is required to wear a mask in the building, if you do not have a mask we are happy to provide you with one when you arrive °• NO Visitor guidelines ° ° °If patient answers YES to any of questions they must change to a virtual visit and place note in comments about symptoms ° °

## 2019-03-18 ENCOUNTER — Other Ambulatory Visit: Payer: Self-pay

## 2019-03-18 ENCOUNTER — Ambulatory Visit (HOSPITAL_COMMUNITY)
Admission: RE | Admit: 2019-03-18 | Discharge: 2019-03-18 | Disposition: A | Discharge status: Home / Self Care | Payer: Medicare HMO | Source: Ambulatory Visit | Attending: Internal Medicine | Admitting: Internal Medicine

## 2019-03-18 ENCOUNTER — Encounter (HOSPITAL_COMMUNITY): Payer: Self-pay | Admitting: Internal Medicine

## 2019-03-18 VITALS — BP 110/80 | HR 83

## 2019-03-18 DIAGNOSIS — G4730 Sleep apnea, unspecified: Secondary | ICD-10-CM | POA: Diagnosis not present

## 2019-03-18 DIAGNOSIS — N320 Bladder-neck obstruction: Secondary | ICD-10-CM | POA: Insufficient documentation

## 2019-03-18 DIAGNOSIS — N319 Neuromuscular dysfunction of bladder, unspecified: Secondary | ICD-10-CM | POA: Insufficient documentation

## 2019-03-18 DIAGNOSIS — Z794 Long term (current) use of insulin: Secondary | ICD-10-CM | POA: Insufficient documentation

## 2019-03-18 DIAGNOSIS — Z7982 Long term (current) use of aspirin: Secondary | ICD-10-CM | POA: Diagnosis not present

## 2019-03-18 DIAGNOSIS — S81802A Unspecified open wound, left lower leg, initial encounter: Secondary | ICD-10-CM | POA: Diagnosis not present

## 2019-03-18 DIAGNOSIS — I2510 Atherosclerotic heart disease of native coronary artery without angina pectoris: Secondary | ICD-10-CM | POA: Insufficient documentation

## 2019-03-18 DIAGNOSIS — N184 Chronic kidney disease, stage 4 (severe): Secondary | ICD-10-CM | POA: Insufficient documentation

## 2019-03-18 DIAGNOSIS — Z955 Presence of coronary angioplasty implant and graft: Secondary | ICD-10-CM | POA: Insufficient documentation

## 2019-03-18 DIAGNOSIS — Z7902 Long term (current) use of antithrombotics/antiplatelets: Secondary | ICD-10-CM | POA: Diagnosis not present

## 2019-03-18 DIAGNOSIS — N1831 Chronic kidney disease, stage 3a: Secondary | ICD-10-CM

## 2019-03-18 DIAGNOSIS — I130 Hypertensive heart and chronic kidney disease with heart failure and stage 1 through stage 4 chronic kidney disease, or unspecified chronic kidney disease: Secondary | ICD-10-CM | POA: Insufficient documentation

## 2019-03-18 DIAGNOSIS — I252 Old myocardial infarction: Secondary | ICD-10-CM | POA: Diagnosis not present

## 2019-03-18 DIAGNOSIS — I255 Ischemic cardiomyopathy: Secondary | ICD-10-CM | POA: Insufficient documentation

## 2019-03-18 DIAGNOSIS — I440 Atrioventricular block, first degree: Secondary | ICD-10-CM | POA: Diagnosis not present

## 2019-03-18 DIAGNOSIS — Z882 Allergy status to sulfonamides status: Secondary | ICD-10-CM | POA: Insufficient documentation

## 2019-03-18 DIAGNOSIS — I5042 Chronic combined systolic (congestive) and diastolic (congestive) heart failure: Secondary | ICD-10-CM | POA: Insufficient documentation

## 2019-03-18 DIAGNOSIS — I472 Ventricular tachycardia: Secondary | ICD-10-CM | POA: Insufficient documentation

## 2019-03-18 DIAGNOSIS — E1142 Type 2 diabetes mellitus with diabetic polyneuropathy: Secondary | ICD-10-CM | POA: Diagnosis not present

## 2019-03-18 DIAGNOSIS — E1122 Type 2 diabetes mellitus with diabetic chronic kidney disease: Secondary | ICD-10-CM | POA: Diagnosis not present

## 2019-03-18 DIAGNOSIS — Z993 Dependence on wheelchair: Secondary | ICD-10-CM | POA: Insufficient documentation

## 2019-03-18 DIAGNOSIS — Z95810 Presence of automatic (implantable) cardiac defibrillator: Secondary | ICD-10-CM | POA: Diagnosis not present

## 2019-03-18 DIAGNOSIS — R972 Elevated prostate specific antigen [PSA]: Secondary | ICD-10-CM | POA: Diagnosis not present

## 2019-03-18 DIAGNOSIS — Z8673 Personal history of transient ischemic attack (TIA), and cerebral infarction without residual deficits: Secondary | ICD-10-CM | POA: Diagnosis not present

## 2019-03-18 NOTE — Patient Instructions (Signed)
Continue current medications  We will contact you in 6 months to schedule your next appointment and echocardiogram
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
## 2019-03-31 ENCOUNTER — Other Ambulatory Visit: Payer: Self-pay

## 2019-03-31 ENCOUNTER — Encounter (HOSPITAL_BASED_OUTPATIENT_CLINIC_OR_DEPARTMENT_OTHER): Payer: Medicare HMO | Attending: Internal Medicine | Admitting: Internal Medicine

## 2019-03-31 DIAGNOSIS — L97821 Non-pressure chronic ulcer of other part of left lower leg limited to breakdown of skin: Secondary | ICD-10-CM | POA: Diagnosis not present

## 2019-03-31 DIAGNOSIS — L97321 Non-pressure chronic ulcer of left ankle limited to breakdown of skin: Secondary | ICD-10-CM | POA: Insufficient documentation

## 2019-03-31 DIAGNOSIS — L97521 Non-pressure chronic ulcer of other part of left foot limited to breakdown of skin: Secondary | ICD-10-CM | POA: Diagnosis not present

## 2019-03-31 DIAGNOSIS — L89613 Pressure ulcer of right heel, stage 3: Secondary | ICD-10-CM | POA: Insufficient documentation

## 2019-03-31 DIAGNOSIS — L97221 Non-pressure chronic ulcer of left calf limited to breakdown of skin: Secondary | ICD-10-CM | POA: Insufficient documentation

## 2019-03-31 DIAGNOSIS — E11621 Type 2 diabetes mellitus with foot ulcer: Secondary | ICD-10-CM | POA: Insufficient documentation

## 2019-03-31 DIAGNOSIS — L97522 Non-pressure chronic ulcer of other part of left foot with fat layer exposed: Secondary | ICD-10-CM | POA: Diagnosis not present

## 2019-03-31 NOTE — Progress Notes (Addendum)
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 Not have an arterial issue 7/28; I do not see much change in the wounds at all left lateral calf about the same. The dorsal ankle area which may have been wrap injury necrotic debris over the surface. He has a  small area draining on the anterior calf tibial area. 8/18; absolutely no improvement in either area. Left lateral calf along the lateral part of the tibia slightly longer and slightly narrower. He now has exposed tendon in the dorsal foot which may have initially been a wrap injury. He does not have obvious arterial insufficiency. I reviewed his arterial studies today these showed some tibial vessel disease however ABIs and TBI's were normal. He has [employer] we have not been able to get advanced treatment products 9/15; 1 month follow-up. There is improvement in the left lateral calf along the lateral part of the tibia. This appears to be largely epithelialized some mild erythema around this but no pain ooHe has a new area in the left mid tibia which is a small wound he says was there last time but I do not know that we do find this may have been eschared over scab. ooHe has a paronychia I on the base of the left first toe with erythema. This is not painful but he is reasonably insensate ooFinally the area on the left dorsal ankle is mostly exposed tendon which ultimately going to need to be removed. ooWe have been using silver collagen in all wound areas 10/6; 3-week follow-up. There is been absolutely no improvement in the left lateral calf. This is a major deterioration from when he was here 3 weeks ago. In addition he has 2 superficial areas one above this and one below. The area over the dorsal foot has necrotic tendon which I removed. 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 I did not think at that time that that anything to do with the upper left tibial wound. 10/27; this is a 3-week follow-up. The left lateral calf actually looks some better. Still open but generally smaller with a healthy looking surface. I believe I gave  him a course of antibiotics when he was here last time. -He also has the area on the dorsal ankle. Again an extensive debridement required here today ooFinally has a new wound on the left great toe dorsally. He is not precisely aware when this occurred. ooUsing Hydrofera Blue on all wound area 11/17; 3-week follow-up. Left lateral calf certainly not any smaller. May be somewhat larger. He has a large area on the dorsal ankle which I thought was an initial wrap injury covered in necrotic debris also an area on the left great toe dorsally Objective Constitutional Sitting or standing Blood Pressure is within target range for patient.. Pulse regular and within target range for patient.Marland Kitchen Respirations regular, non-labored and within target range.Marland Kitchen  Temperature is normal and within the target range for the patient.Marland Kitchen Appears in no distress. Vitals Time Taken: 10:50 AM, Height: 74 in, Weight: 150 lbs, BMI: 19.3, Temperature: 98.5 F, Pulse: 76 bpm, Respiratory Rate: 18 breaths/min, Blood Pressure: 121/68 mmHg, Capillary Blood Glucose: 115 mg/dl. General Notes: patient reported a CBG of 115 this morning General Notes: Wound exam; left lower extremity wound parallel to the tibia. Debris on the surface washed off with Anasept and gauze this does not require mechanical debridement whereas the area on the dorsal ankle and first toe both required debridements with a #5 curette. There is no evidence of infection. He has a faint pedal pulse but previous arterial studies were fairly unremarkable. Integumentary (Hair, Skin) Wound #14 status is Open. Original cause of wound was Gradually Appeared. The wound is located on the Left,Lateral Lower Leg. The wound measures 8.8cm length x 1.2cm width x 0.3cm depth; 8.294cm^2 area and 2.488cm^3 volume. There is Fat Layer (Subcutaneous Tissue) Exposed exposed. There is no tunneling or undermining noted. There is a medium amount of serosanguineous drainage noted. The  wound margin is well defined and not attached to the wound base. There is medium (34-66%) red, pink, hyper - granulation within the wound bed. There is a medium (34-66%) amount of necrotic tissue within the wound bed including Adherent Slough. Wound #18 status is Open. Original cause of wound was Gradually Appeared. The wound is located on the Left,Dorsal Foot. The wound measures 3.5cm length x 1.8cm width x 0.1cm depth; 4.948cm^2 area and 0.495cm^3 volume. There is muscle, tendon, and Fat Layer (Subcutaneous Tissue) Exposed exposed. There is no tunneling or undermining noted. There is a medium amount of serosanguineous drainage noted. The wound margin is well defined and not attached to the wound base. There is small (1-33%) pink granulation within the wound bed. There is a large (67-100%) amount of necrotic tissue within the wound bed including Eschar, Adherent Slough and Necrosis of Muscle. Wound #20 status is Open. Original cause of wound was Trauma. The wound is located on the Left Toe Great. The wound measures 0cm length x 0cm width x 0cm depth; 0cm^2 area and 0cm^3 volume. There is no tunneling or undermining noted. There is a none present amount of drainage noted. The wound margin is distinct with the outline attached to the wound base. There is no granulation within the wound bed. There is no necrotic tissue within the wound bed. Wound #22 status is Open. Original cause of wound was Pressure Injury. The wound is located on the [employer]. The wound measures 0.5cm length x 1cm width x 0.1cm depth; 0.393cm^2 area and 0.039cm^3 volume. There is Fat Layer (Subcutaneous Tissue) Exposed exposed. There is no tunneling or undermining noted. There is a small amount of serous drainage noted. The wound margin is distinct with the outline attached to the wound base. There is small (1-33%) pink granulation within the wound bed. There is a large (67-100%) amount of necrotic tissue within  the wound bed including Eschar and Adherent Slough. 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 There was a Excisional Skin/Subcutaneous Tissue Debridement with a total area of 6.3 sq cm performed by [name], MD. With the following instrument(s): Curette to remove Viable and Non-Viable tissue/material. Material removed includes Subcutaneous Tissue, Slough, Skin: Dermis, and Skin: Epidermis after achieving pain control using Lidocaine 5% topical ointment. No specimens were taken. A time out was conducted at 11:22, prior to the start of the procedure. A Moderate amount of bleeding was controlled with Pressure. The procedure was tolerated well with a pain level of 0 throughout and a pain level of 0 following the procedure. Post Debridement Measurements: 3.5cm length x 1.8cm width x 0.1cm depth; 0.495cm^3 volume. Character of Wound/Ulcer Post Debridement is improved. Severity of Tissue Post Debridement is: Fat layer exposed. Post procedure Diagnosis Wound #18: Same as Pre-Procedure Plan Follow-up Appointments: Return appointment in 3 weeks. Dressing Change Frequency: Change dressing three times week. Skin Barriers/Peri-Wound Care: Other: - TCA to left lower extrimity Wound Cleansing: Clean wound with Wound Cleanser May shower with protection. Primary Wound Dressing: Wound #14 Left,Lateral Lower Leg: Other: - sorbact Wound #18 Left,Dorsal Foot: Other: - sorbact Wound #22 Left,Distal  Toe Great: Other: - sorbact Secondary Dressing: Wound #14 Left,Lateral Lower Leg: Dry Gauze Wound #18 Left,Dorsal Foot: Dry Gauze Wound #22 Left,Distal Toe Great: Dry Gauze Edema Control: Kerlix and Coban - Left Lower Extremity Avoid standing for long periods of time Elevate legs to the level of the heart or above for 30 minutes daily and/or when sitting, a frequency of: - throughout the day 1. Change the primary dressing to Sorbact. Truthfully we have used just about every one of our standard dressings. He had an exorbitant core co-pay for an advanced treatment option. He does not have an obvious ischemic or infection issue. Electronic Signature(s) Signed: 03/10/2019 6:07:35 PM By: Linton Ham MD Entered By: Linton Ham on 03/10/2019 11:29:55 -------------------------------------------------------------------------------- SuperBill Details Patient Name: Date of Service: [name] 03/10/2019 Medical Record [id number] Patient Account Number: [id number] Date of Birth/Sex: Treating RN: Apr 01, 1946 (73 y.o. [name] Primary Care Provider: [name], [name] Other Clinician: Referring Provider: Treating Provider/Extender:[name], [name] [name], [name] in Treatment: 151 Diagnosis Coding ICD-10 Codes Code Description 206 [id number] Non-pressure chronic ulcer of left calf limited to breakdown of skin L97.321 Non-pressure chronic ulcer of left ankle limited to breakdown of skin L97.821 Non-pressure chronic ulcer of other part of left lower leg limited to breakdown of skin L97.521 Non-pressure chronic ulcer of other part of left foot limited to breakdown of skin Facility Procedures CPT4 Code Description: [id number] - DEB SUBQ TISSUE 20 SQ CM/< ICD-10 Diagnosis Description L97.521 Non-pressure chronic ulcer of other part of left foot limit Modifier: ed to breakdown of Quantity: 1 skin Physician Procedures CPT4 Code Description: [id number] - WC PHYS SUBQ  TISS 20 SQ CM ICD-10 Diagnosis Description L97.521 Non-pressure chronic ulcer of other part of left foot limite Modifier: d to breakdown of Quantity: 1 skin Electronic Signature(s) Signed: 03/10/2019 6:07:35 PM By: Linton Ham MD Entered By: Linton Ham on 03/10/2019 11:30:22

## 2019-03-31 NOTE — Progress Notes (Signed)
Wound Cleansing / Measurement []  - Simple Wound Cleansing - one wound 0 X - Complex Wound Cleansing - multiple wounds 4 5 X - Wound Imaging (photographs - any number of wounds) 1 5 []  - Wound Tracing (instead of photographs) 0 []  - Simple Wound Measurement - one wound 0 X - Complex Wound Measurement - multiple wounds 4 5 INTERVENTIONS - Wound Dressings []  - Small Wound Dressing one or multiple wounds 0 []  - Medium Wound Dressing one or multiple wounds 0 X - Large Wound Dressing one or multiple wounds 1 20 X - Application of Medications - topical 1 5 []  - Application of Medications - injection 0 INTERVENTIONS - Miscellaneous []  - External ear exam 0 []  - Specimen Collection (cultures, biopsies, blood, body fluids, etc.) 0 []  - Specimen(s) / Culture(s) sent or taken to Lab for analysis 0 []  - Patient Transfer (multiple staff / Civil Service fast streamer / Similar devices) 0 []  - Simple Staple / Suture removal (25 or less) 0 []  - Complex Staple / Suture removal (26 or more) 0 []  - Hypo / Hyperglycemic Management (close monitor of Blood Glucose) 0 []  - Ankle / Brachial Index (ABI) - do not check if billed separately 0 X - Vital Signs 1 5 Has the patient been seen at the hospital within the last three years: Yes Total Score: 135 Level Of Care: New/Established - Level 4 Electronic Signature(s) Signed: 03/31/2019 3:04:22 PM By: [name] RN Entered By: [name] on 01/06/2019 08:38:09 -------------------------------------------------------------------------------- Encounter Discharge Information Details Patient Name: Date of Service: [name] [name]. 01/06/2019 7:30 AM [name] [id number] Patient Account Number: [id number] Date of Birth/Sex: Treating RN: 1945/05/11 (73 y.o. Marvis Repress Primary Care Deion Forgue: [name], [name] Other Clinician: Referring Aprel Egelhoff: Treating Buelah Rennie/Extender:[name], [name] [name], [name] in Treatment: [phone] Encounter Discharge Information Items Discharge Condition: Stable Ambulatory Status: Wheelchair Discharge Destination: Home Transportation: Private Auto Accompanied By: alone Schedule Follow-up Appointment: Yes Clinical Summary of Care: Electronic Signature(s) Signed: 01/06/2019 8:55:44 AM By: Kela Millin Entered By: Kela Millin on 01/06/2019 08:53:27 -------------------------------------------------------------------------------- Lower Extremity Assessment Details Patient Name: Date of Service: [name] [name], [name] [name] 01/06/2019 7:30 AM [name] [id number] Patient Account Number: [id number] Date of Birth/Sex: Treating RN: 05-Nov-1945 (73 y.o. [name] Primary Care Oleg Oleson: [name], [name] Other Clinician: Referring Mayzie Caughlin: Treating Julionna Marczak/Extender:[name], [name] [name], [name] in Treatment: 142 Edema Assessment Assessed: [Left: No] [Right: No] Edema: [Left: Ye] [Right: s] Calf Left: Right: Point of Measurement: 36 cm From Medial Instep 22.4 cm cm Ankle Left: Right: Point of Measurement: 11 cm From Medial Instep 18.3 cm cm Vascular Assessment Pulses: Dorsalis Pedis Palpable: [Left:No] Notes 3+ pitting edema right foot Electronic Signature(s) Signed: 01/06/2019 7:04:56 PM By: Baruch Gouty RN, BSN Entered By: Baruch Gouty on 01/06/2019 08:09:03 -------------------------------------------------------------------------------- Multi Wound Chart Details Patient Name: Date of Service: [name] [name]. 01/06/2019 7:30 AM [name] [id number] Patient Account Number: [id number] Date of Birth/Sex: Treating RN: 1945/07/02 (73 y.o. [name] Primary Care Madysun Thall: [name], [name] Other Clinician: Referring Brennan Litzinger: Treating Aubreyana Saltz/Extender:[name],  [name] [name], [name] in Treatment: 142 Vital Signs Height(in): 74 Capillary Blood 119 Glucose(mg/dl): Weight(lbs): 150 Pulse(bpm): 80 Body Mass Index(BMI): 19 Blood Pressure(mmHg): 128/93 Temperature(F): 98.3 Respiratory 18 Rate(breaths/min): Photos: [14:No Photos] [18:No Photos] [19:No Photos] Wound Location: [14:Left Lower Leg - Lateral] [18:Left Foot - Dorsal] [19:Left Lower Leg - Anterior] Wounding Event: [14:Gradually Appeared] [18:Gradually Appeared] [19:Gradually Appeared] 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 [name] Primary Care Temitope Flammer: [name], [name] Other Clinician: Referring Shanece Cochrane: Treating Ashlan Dignan/Extender:[name], [name] [name], [name] in Treatment: 142 Visit Information History Since Last Visit All ordered tests and consults were completed: Yes Patient Arrived: Wheel Chair Added or deleted any medications: No Arrival Time: 07:55 Any new allergies or adverse reactions: No Accompanied By: Charolett Bumpers Had a fall or experienced change in No Transfer Assistance: None activities of daily living that may affect Patient Identification Verified: Yes risk of falls: Secondary Verification Process Yes Signs or symptoms of abuse/neglect since last No Completed: visito Patient Requires Transmission- No Hospitalized since last visit: No Based Precautions: Implantable device outside of the clinic excluding No Patient Has Alerts: Yes Patient on Blood cellular tissue based products placed in the center Patient Alerts: since last visit: Thinner left ABI 1.0; rt ABI Has Dressing in Place as Prescribed: Yes Has Compression in Place as Prescribed: Yes 1.09 Pain Present Now: No Electronic Signature(s) Signed: 01/06/2019 7:04:56 PM By: Baruch Gouty RN, BSN Entered By: Baruch Gouty on 01/06/2019 07:56:52 -------------------------------------------------------------------------------- Clinic Level of Care Assessment Details Patient Name: Date of Service: [name] [name], [name] [name] 01/06/2019 7:30 AM [name] [id number] Patient Account Number: [id number] Date of Birth/Sex: Treating RN: [birth year] (73 y.o. [name]) [name] Primary Care Kateria Cutrona: [name], [name] Other Clinician: Referring Katelen Luepke:  Treating Leelynd Maldonado/Extender:[name], [name] [name], [name] in Treatment: 142 Clinic Level of Care Assessment Items TOOL 4 Quantity Score X - Use when only an EandM is performed on FOLLOW-UP visit 1 0 ASSESSMENTS - Nursing Assessment / Reassessment X - Reassessment of Co-morbidities (includes updates in patient status) 1 10 X - Reassessment of Adherence to Treatment Plan 1 5 ASSESSMENTS - Wound and Skin Assessment / Reassessment []  - Simple Wound Assessment / Reassessment - one wound 0 X - Complex Wound Assessment / Reassessment - multiple wounds 4 5 []  - Dermatologic / Skin Assessment (not related to wound area) 0 ASSESSMENTS - Focused Assessment []  - Circumferential Edema Measurements - multi extremities 0 []  - Nutritional Assessment / Counseling / Intervention 0 []  - Lower Extremity Assessment (monofilament, tuning fork, pulses) 0 []  - Peripheral Arterial Disease Assessment (using hand held doppler) 0 ASSESSMENTS - Ostomy and/or Continence Assessment and Care []  - Incontinence Assessment and Management 0 []  - Ostomy Care Assessment and Management (repouching, etc.) 0 PROCESS - Coordination of Care X - Simple Patient / Family Education for ongoing care 1 15 []  - Complex (extensive) Patient / Family Education for ongoing care 0 []  - Staff obtains Programmer, systems, Records, Test Results / Process Orders 0 []  - Staff telephones HHA, Nursing Homes / Clarify orders / etc 0 []  - Routine Transfer to another Facility (non-emergent condition) 0 []  - Routine Hospital Admission (non-emergent condition) 0 []  - New Admissions / Biomedical engineer / Ordering NPWT, Apligraf, etc. 0 []  - Emergency Hospital Admission (emergent condition) 0 X - Simple Discharge Coordination 1 10 []  - Complex (extensive) Discharge Coordination 0 PROCESS - Special Needs []  - Pediatric / Minor Patient Management 0 []  - Isolation Patient Management 0 []  - Hearing / Language / Visual special needs 0 []  - Assessment  of Community assistance (transportation, D/C planning, etc.) 0 []  - Additional assistance / Altered mentation 0 []  - Support Surface(s) Assessment (bed, cushion, seat, etc.) 0 INTERVENTIONS -  [name] [name], [name] [name] ([id number]) Visit Report for 01/06/2019 Arrival Information Details Patient Name: Date of Service: [name] [name], [name] [name] 01/06/2019 7:30 AM [name] [id number] Patient Account Number: [id number] Date of Birth/Sex: Treating RN: [birth year] (73 y.o. [name] Primary Care Temitope Flammer: [name], [name] Other Clinician: Referring Shanece Cochrane: Treating Ashlan Dignan/Extender:[name], [name] [name], [name] in Treatment: 142 Visit Information History Since Last Visit All ordered tests and consults were completed: Yes Patient Arrived: Wheel Chair Added or deleted any medications: No Arrival Time: 07:55 Any new allergies or adverse reactions: No Accompanied By: Charolett Bumpers Had a fall or experienced change in No Transfer Assistance: None activities of daily living that may affect Patient Identification Verified: Yes risk of falls: Secondary Verification Process Yes Signs or symptoms of abuse/neglect since last No Completed: visito Patient Requires Transmission- No Hospitalized since last visit: No Based Precautions: Implantable device outside of the clinic excluding No Patient Has Alerts: Yes Patient on Blood cellular tissue based products placed in the center Patient Alerts: since last visit: Thinner left ABI 1.0; rt ABI Has Dressing in Place as Prescribed: Yes Has Compression in Place as Prescribed: Yes 1.09 Pain Present Now: No Electronic Signature(s) Signed: 01/06/2019 7:04:56 PM By: Baruch Gouty RN, BSN Entered By: Baruch Gouty on 01/06/2019 07:56:52 -------------------------------------------------------------------------------- Clinic Level of Care Assessment Details Patient Name: Date of Service: [name] [name], [name] [name] 01/06/2019 7:30 AM [name] [id number] Patient Account Number: [id number] Date of Birth/Sex: Treating RN: [birth year] (73 y.o. [name]) [name] Primary Care Kateria Cutrona: [name], [name] Other Clinician: Referring Katelen Luepke:  Treating Leelynd Maldonado/Extender:[name], [name] [name], [name] in Treatment: 142 Clinic Level of Care Assessment Items TOOL 4 Quantity Score X - Use when only an EandM is performed on FOLLOW-UP visit 1 0 ASSESSMENTS - Nursing Assessment / Reassessment X - Reassessment of Co-morbidities (includes updates in patient status) 1 10 X - Reassessment of Adherence to Treatment Plan 1 5 ASSESSMENTS - Wound and Skin Assessment / Reassessment []  - Simple Wound Assessment / Reassessment - one wound 0 X - Complex Wound Assessment / Reassessment - multiple wounds 4 5 []  - Dermatologic / Skin Assessment (not related to wound area) 0 ASSESSMENTS - Focused Assessment []  - Circumferential Edema Measurements - multi extremities 0 []  - Nutritional Assessment / Counseling / Intervention 0 []  - Lower Extremity Assessment (monofilament, tuning fork, pulses) 0 []  - Peripheral Arterial Disease Assessment (using hand held doppler) 0 ASSESSMENTS - Ostomy and/or Continence Assessment and Care []  - Incontinence Assessment and Management 0 []  - Ostomy Care Assessment and Management (repouching, etc.) 0 PROCESS - Coordination of Care X - Simple Patient / Family Education for ongoing care 1 15 []  - Complex (extensive) Patient / Family Education for ongoing care 0 []  - Staff obtains Programmer, systems, Records, Test Results / Process Orders 0 []  - Staff telephones HHA, Nursing Homes / Clarify orders / etc 0 []  - Routine Transfer to another Facility (non-emergent condition) 0 []  - Routine Hospital Admission (non-emergent condition) 0 []  - New Admissions / Biomedical engineer / Ordering NPWT, Apligraf, etc. 0 []  - Emergency Hospital Admission (emergent condition) 0 X - Simple Discharge Coordination 1 10 []  - Complex (extensive) Discharge Coordination 0 PROCESS - Special Needs []  - Pediatric / Minor Patient Management 0 []  - Isolation Patient Management 0 []  - Hearing / Language / Visual special needs 0 []  - Assessment  of Community assistance (transportation, D/C planning, etc.) 0 []  - Additional assistance / Altered mentation 0 []  - Support Surface(s) Assessment (bed, cushion, seat, etc.) 0 INTERVENTIONS -  Wound Cleansing / Measurement []  - Simple Wound Cleansing - one wound 0 X - Complex Wound Cleansing - multiple wounds 4 5 X - Wound Imaging (photographs - any number of wounds) 1 5 []  - Wound Tracing (instead of photographs) 0 []  - Simple Wound Measurement - one wound 0 X - Complex Wound Measurement - multiple wounds 4 5 INTERVENTIONS - Wound Dressings []  - Small Wound Dressing one or multiple wounds 0 []  - Medium Wound Dressing one or multiple wounds 0 X - Large Wound Dressing one or multiple wounds 1 20 X - Application of Medications - topical 1 5 []  - Application of Medications - injection 0 INTERVENTIONS - Miscellaneous []  - External ear exam 0 []  - Specimen Collection (cultures, biopsies, blood, body fluids, etc.) 0 []  - Specimen(s) / Culture(s) sent or taken to Lab for analysis 0 []  - Patient Transfer (multiple staff / Civil Service fast streamer / Similar devices) 0 []  - Simple Staple / Suture removal (25 or less) 0 []  - Complex Staple / Suture removal (26 or more) 0 []  - Hypo / Hyperglycemic Management (close monitor of Blood Glucose) 0 []  - Ankle / Brachial Index (ABI) - do not check if billed separately 0 X - Vital Signs 1 5 Has the patient been seen at the hospital within the last three years: Yes Total Score: 135 Level Of Care: New/Established - Level 4 Electronic Signature(s) Signed: 03/31/2019 3:04:22 PM By: [name] RN Entered By: [name] on 01/06/2019 08:38:09 -------------------------------------------------------------------------------- Encounter Discharge Information Details Patient Name: Date of Service: [name] [name]. 01/06/2019 7:30 AM [name] [id number] Patient Account Number: [id number] Date of Birth/Sex: Treating RN: 1945/05/11 (73 y.o. Marvis Repress Primary Care Deion Forgue: [name], [name] Other Clinician: Referring Aprel Egelhoff: Treating Buelah Rennie/Extender:[name], [name] [name], [name] in Treatment: [phone] Encounter Discharge Information Items Discharge Condition: Stable Ambulatory Status: Wheelchair Discharge Destination: Home Transportation: Private Auto Accompanied By: alone Schedule Follow-up Appointment: Yes Clinical Summary of Care: Electronic Signature(s) Signed: 01/06/2019 8:55:44 AM By: Kela Millin Entered By: Kela Millin on 01/06/2019 08:53:27 -------------------------------------------------------------------------------- Lower Extremity Assessment Details Patient Name: Date of Service: [name] [name], [name] [name] 01/06/2019 7:30 AM [name] [id number] Patient Account Number: [id number] Date of Birth/Sex: Treating RN: 05-Nov-1945 (73 y.o. [name] Primary Care Oleg Oleson: [name], [name] Other Clinician: Referring Mayzie Caughlin: Treating Julionna Marczak/Extender:[name], [name] [name], [name] in Treatment: 142 Edema Assessment Assessed: [Left: No] [Right: No] Edema: [Left: Ye] [Right: s] Calf Left: Right: Point of Measurement: 36 cm From Medial Instep 22.4 cm cm Ankle Left: Right: Point of Measurement: 11 cm From Medial Instep 18.3 cm cm Vascular Assessment Pulses: Dorsalis Pedis Palpable: [Left:No] Notes 3+ pitting edema right foot Electronic Signature(s) Signed: 01/06/2019 7:04:56 PM By: Baruch Gouty RN, BSN Entered By: Baruch Gouty on 01/06/2019 08:09:03 -------------------------------------------------------------------------------- Multi Wound Chart Details Patient Name: Date of Service: [name] [name]. 01/06/2019 7:30 AM [name] [id number] Patient Account Number: [id number] Date of Birth/Sex: Treating RN: 1945/07/02 (73 y.o. [name] Primary Care Madysun Thall: [name], [name] Other Clinician: Referring Brennan Litzinger: Treating Aubreyana Saltz/Extender:[name],  [name] [name], [name] in Treatment: 142 Vital Signs Height(in): 74 Capillary Blood 119 Glucose(mg/dl): Weight(lbs): 150 Pulse(bpm): 80 Body Mass Index(BMI): 19 Blood Pressure(mmHg): 128/93 Temperature(F): 98.3 Respiratory 18 Rate(breaths/min): Photos: [14:No Photos] [18:No Photos] [19:No Photos] Wound Location: [14:Left Lower Leg - Lateral] [18:Left Foot - Dorsal] [19:Left Lower Leg - Anterior] Wounding Event: [14:Gradually Appeared] [18:Gradually Appeared] [19:Gradually Appeared] 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 [name] Primary Care Temitope Flammer: [name], [name] Other Clinician: Referring Shanece Cochrane: Treating Ashlan Dignan/Extender:[name], [name] [name], [name] in Treatment: 142 Visit Information History Since Last Visit All ordered tests and consults were completed: Yes Patient Arrived: Wheel Chair Added or deleted any medications: No Arrival Time: 07:55 Any new allergies or adverse reactions: No Accompanied By: Charolett Bumpers Had a fall or experienced change in No Transfer Assistance: None activities of daily living that may affect Patient Identification Verified: Yes risk of falls: Secondary Verification Process Yes Signs or symptoms of abuse/neglect since last No Completed: visito Patient Requires Transmission- No Hospitalized since last visit: No Based Precautions: Implantable device outside of the clinic excluding No Patient Has Alerts: Yes Patient on Blood cellular tissue based products placed in the center Patient Alerts: since last visit: Thinner left ABI 1.0; rt ABI Has Dressing in Place as Prescribed: Yes Has Compression in Place as Prescribed: Yes 1.09 Pain Present Now: No Electronic Signature(s) Signed: 01/06/2019 7:04:56 PM By: Baruch Gouty RN, BSN Entered By: Baruch Gouty on 01/06/2019 07:56:52 -------------------------------------------------------------------------------- Clinic Level of Care Assessment Details Patient Name: Date of Service: [name] [name], [name] [name] 01/06/2019 7:30 AM [name] [id number] Patient Account Number: [id number] Date of Birth/Sex: Treating RN: [birth year] (73 y.o. [name]) [name] Primary Care Kateria Cutrona: [name], [name] Other Clinician: Referring Katelen Luepke:  Treating Leelynd Maldonado/Extender:[name], [name] [name], [name] in Treatment: 142 Clinic Level of Care Assessment Items TOOL 4 Quantity Score X - Use when only an EandM is performed on FOLLOW-UP visit 1 0 ASSESSMENTS - Nursing Assessment / Reassessment X - Reassessment of Co-morbidities (includes updates in patient status) 1 10 X - Reassessment of Adherence to Treatment Plan 1 5 ASSESSMENTS - Wound and Skin Assessment / Reassessment []  - Simple Wound Assessment / Reassessment - one wound 0 X - Complex Wound Assessment / Reassessment - multiple wounds 4 5 []  - Dermatologic / Skin Assessment (not related to wound area) 0 ASSESSMENTS - Focused Assessment []  - Circumferential Edema Measurements - multi extremities 0 []  - Nutritional Assessment / Counseling / Intervention 0 []  - Lower Extremity Assessment (monofilament, tuning fork, pulses) 0 []  - Peripheral Arterial Disease Assessment (using hand held doppler) 0 ASSESSMENTS - Ostomy and/or Continence Assessment and Care []  - Incontinence Assessment and Management 0 []  - Ostomy Care Assessment and Management (repouching, etc.) 0 PROCESS - Coordination of Care X - Simple Patient / Family Education for ongoing care 1 15 []  - Complex (extensive) Patient / Family Education for ongoing care 0 []  - Staff obtains Programmer, systems, Records, Test Results / Process Orders 0 []  - Staff telephones HHA, Nursing Homes / Clarify orders / etc 0 []  - Routine Transfer to another Facility (non-emergent condition) 0 []  - Routine Hospital Admission (non-emergent condition) 0 []  - New Admissions / Biomedical engineer / Ordering NPWT, Apligraf, etc. 0 []  - Emergency Hospital Admission (emergent condition) 0 X - Simple Discharge Coordination 1 10 []  - Complex (extensive) Discharge Coordination 0 PROCESS - Special Needs []  - Pediatric / Minor Patient Management 0 []  - Isolation Patient Management 0 []  - Hearing / Language / Visual special needs 0 []  - Assessment  of Community assistance (transportation, D/C planning, etc.) 0 []  - Additional assistance / Altered mentation 0 []  - Support Surface(s) Assessment (bed, cushion, seat, etc.) 0 INTERVENTIONS -  [name] [name], [name] [name] ([id number]) Visit Report for 01/06/2019 Arrival Information Details Patient Name: Date of Service: [name] [name], [name] [name] 01/06/2019 7:30 AM [name] [id number] Patient Account Number: [id number] Date of Birth/Sex: Treating RN: [birth year] (73 y.o. [name] Primary Care Temitope Flammer: [name], [name] Other Clinician: Referring Shanece Cochrane: Treating Ashlan Dignan/Extender:[name], [name] [name], [name] in Treatment: 142 Visit Information History Since Last Visit All ordered tests and consults were completed: Yes Patient Arrived: Wheel Chair Added or deleted any medications: No Arrival Time: 07:55 Any new allergies or adverse reactions: No Accompanied By: Charolett Bumpers Had a fall or experienced change in No Transfer Assistance: None activities of daily living that may affect Patient Identification Verified: Yes risk of falls: Secondary Verification Process Yes Signs or symptoms of abuse/neglect since last No Completed: visito Patient Requires Transmission- No Hospitalized since last visit: No Based Precautions: Implantable device outside of the clinic excluding No Patient Has Alerts: Yes Patient on Blood cellular tissue based products placed in the center Patient Alerts: since last visit: Thinner left ABI 1.0; rt ABI Has Dressing in Place as Prescribed: Yes Has Compression in Place as Prescribed: Yes 1.09 Pain Present Now: No Electronic Signature(s) Signed: 01/06/2019 7:04:56 PM By: Baruch Gouty RN, BSN Entered By: Baruch Gouty on 01/06/2019 07:56:52 -------------------------------------------------------------------------------- Clinic Level of Care Assessment Details Patient Name: Date of Service: [name] [name], [name] [name] 01/06/2019 7:30 AM [name] [id number] Patient Account Number: [id number] Date of Birth/Sex: Treating RN: [birth year] (73 y.o. [name]) [name] Primary Care Kateria Cutrona: [name], [name] Other Clinician: Referring Katelen Luepke:  Treating Leelynd Maldonado/Extender:[name], [name] [name], [name] in Treatment: 142 Clinic Level of Care Assessment Items TOOL 4 Quantity Score X - Use when only an EandM is performed on FOLLOW-UP visit 1 0 ASSESSMENTS - Nursing Assessment / Reassessment X - Reassessment of Co-morbidities (includes updates in patient status) 1 10 X - Reassessment of Adherence to Treatment Plan 1 5 ASSESSMENTS - Wound and Skin Assessment / Reassessment []  - Simple Wound Assessment / Reassessment - one wound 0 X - Complex Wound Assessment / Reassessment - multiple wounds 4 5 []  - Dermatologic / Skin Assessment (not related to wound area) 0 ASSESSMENTS - Focused Assessment []  - Circumferential Edema Measurements - multi extremities 0 []  - Nutritional Assessment / Counseling / Intervention 0 []  - Lower Extremity Assessment (monofilament, tuning fork, pulses) 0 []  - Peripheral Arterial Disease Assessment (using hand held doppler) 0 ASSESSMENTS - Ostomy and/or Continence Assessment and Care []  - Incontinence Assessment and Management 0 []  - Ostomy Care Assessment and Management (repouching, etc.) 0 PROCESS - Coordination of Care X - Simple Patient / Family Education for ongoing care 1 15 []  - Complex (extensive) Patient / Family Education for ongoing care 0 []  - Staff obtains Programmer, systems, Records, Test Results / Process Orders 0 []  - Staff telephones HHA, Nursing Homes / Clarify orders / etc 0 []  - Routine Transfer to another Facility (non-emergent condition) 0 []  - Routine Hospital Admission (non-emergent condition) 0 []  - New Admissions / Biomedical engineer / Ordering NPWT, Apligraf, etc. 0 []  - Emergency Hospital Admission (emergent condition) 0 X - Simple Discharge Coordination 1 10 []  - Complex (extensive) Discharge Coordination 0 PROCESS - Special Needs []  - Pediatric / Minor Patient Management 0 []  - Isolation Patient Management 0 []  - Hearing / Language / Visual special needs 0 []  - Assessment  of Community assistance (transportation, D/C planning, etc.) 0 []  - Additional assistance / Altered mentation 0 []  - Support Surface(s) Assessment (bed, cushion, seat, etc.) 0 INTERVENTIONS -

## 2019-03-31 NOTE — Progress Notes (Signed)
Failure, Coronary Artery Disease, Hypertension, Myocardial Infarction, Type II  Diabetes, Gout, Neuropathy Photos Wound Measurements Length: (cm) 0 % Reductio Width: (cm) 0 % Reductio Depth: (cm) 0 Epithelial Area: (cm) 0 Volume: (cm) 0 Wound Description Classification: Grade 1 Foul Odor Wound Margin: Flat and Intact Slough/Fib Exudate Amount: Medium Exudate Type: Purulent Exudate Color: yellow, brown, green Wound Bed Granulation Amount: Large (67-100%) Granulation Quality: Red Fascia Exp Necrotic Amount: None Present (0%) Fat Layer Tendon Exp Muscle Joint E Bone Ex After Cleansing: No rino No Exposed Structure osed: No (Subcutaneous Tissue) Exposed: Yes osed: No Exposed: No xposed: No posed: No n in Area: 100% n in Volume: 100% ization: None Electronic Signature(s) Signed: 02/19/2019 3:04:52 PM By: Deon Pilling Signed: 02/25/2019 4:01:07 PM By: Mikeal Hawthorne EMT/HBOT Previous Signature: 02/17/2019 5:02:12 PM Version By: Deon Pilling Entered By: Mikeal Hawthorne on 02/19/2019 08:09:10 -------------------------------------------------------------------------------- Wound Assessment Details Patient Name: Date of Service: [name] [name] 02/17/2019 9:30 AM [name] [id number] Patient Account Number: [id number] Date of Birth/Sex: Treating RN: 25-Sep-1945 (73 y.o. [name] Primary Care Gresham Caetano: [name], [name] Other Clinician: Referring Locklyn Henriquez: Treating Demontez Novack/Extender:[name], [name] [name], [name] in Treatment: 148 Wound Status Wound Number: 20 Primary Inflammatory Etiology: Wound Location: Left Toe Great Wound Open Wounding Event: Trauma Status: Date Acquired: 01/04/2019 Comorbid Anemia, Sleep Apnea, Arrhythmia, Congestive Weeks Of Treatment: 6 History: Heart Failure, Coronary Artery Disease, Clustered Wound: No Hypertension, Myocardial Infarction, Type II Diabetes, Gout, Neuropathy Photos Wound Measurements Length: (cm) 0.2 % Reduct Width: (cm) 1 % Reduct Depth: (cm) 0.1 Epitheli Area: (cm) 0.157  Tunneli Volume: (cm) 0.016 Undermi Wound Description Classification: Full Thickness Without Exposed Support Foul Od Structures Slough/ Wound Distinct, outline attached Margin: Exudate Exudate None Present Amount: Wound Bed Granulation Amount: Large (67-100%) Granulation Quality: Pink Fascia E Necrotic Amount: None Present (0%) Fat Laye Tendon E Muscle E Joint Ex Bone Exp or After Cleansing: No Fibrino No Exposed Structure xposed: No r (Subcutaneous Tissue) Exposed: Yes xposed: No xposed: No posed: No osed: No ion in Area: 50% ion in Volume: 48.4% alization: None ng: No ning: No Electronic Signature(s) Signed: 02/25/2019 4:01:07 PM By: Mikeal Hawthorne EMT/HBOT Signed: 03/31/2019 3:01:15 PM By: [name] RN Entered By: Mikeal Hawthorne on 02/19/2019 08:10:03 -------------------------------------------------------------------------------- Wound Assessment Details Patient Name: Date of Service: [name] [name] 02/17/2019 9:30 AM [name] [id number] Patient Account Number: [id number] Date of Birth/Sex: Treating RN: April 12, 1946 (73 y.o. [name] [name] Primary Care Alaijah Gibler: [name], [name] Other Clinician: Referring Merrell Rettinger: Treating Fredda Clarida/Extender:[name], [name] [name], [name] in Treatment: 148 Wound Status Wound Number: 21 Primary Diabetic Wound/Ulcer of the Lower Extremity Etiology: Wound Location: Left Lower Leg - Anterior, Distal Wound Healed - Epithelialized Wounding Event: Gradually Appeared Status: Date Acquired: 12/30/2018 Comorbid Anemia, Sleep Apnea, Arrhythmia, Congestive Weeks Of Treatment: 3 History: Heart Failure, Coronary Artery Disease, Clustered Wound: No Hypertension, Myocardial Infarction, Type II Diabetes, Gout, Neuropathy Photos Wound Measurements Length: (cm) 0 % Reducti Width: (cm) 0 % Reducti Depth: (cm) 0 Epithelia Area: (cm) 0 Volume: (cm) 0 Wound Description Classification: Grade 1 Wound Margin:  Distinct, outline attached Exudate Amount: Small Exudate Type: Purulent Exudate Color: yellow, brown, green Wound Bed Granulation Amount: Large (67-100%) Granulation Quality: Red, Pink Necrotic Amount: None Present (0%) Foul Odor After Cleansing: No Slough/Fibrino No Exposed Structure Fascia Exposed: No Fat Layer (Subcutaneous Tissue) Exposed: Yes Tendon Exposed: No Muscle Exposed: No Joint Exposed: No Bone Exposed: No on in Area: 100% on in Volume: 100% lization: None Electronic Signature(s) Signed: 02/19/2019  JUVENCIO, HALVORSON ([id number]) Visit Report for 02/17/2019 Arrival Information Details Patient Name: Date of Service: [name] [name] 02/17/2019 9:30 AM [name] [id number] Patient Account Number: [id number] Date of Birth/Sex: Treating RN: December 27, 1945 (73 y.o. [name] [name] Primary Care Jessieca Rhem: [name], [name] Other Clinician: Referring Kassadie Pancake: Treating Madiline Saffran/Extender:[name], [name] [name], [name] in Treatment: 148 Visit Information History Since Last Visit Added or deleted any medications: No Patient Arrived: Wheel Chair Any new allergies or adverse reactions: No Arrival Time: 09:50 Had a fall or experienced change in No Accompanied By: self activities of daily living that may affect Transfer Assistance: None risk of falls: Patient Identification Verified: Yes Signs or symptoms of abuse/neglect since last No Secondary Verification Process Yes visito Completed: Hospitalized since last visit: No Patient Requires Transmission- No Implantable device outside of the clinic excluding No Based Precautions: cellular tissue based products placed in the center Patient Has Alerts: Yes Patient on Blood since last visit: Patient Alerts: Has Dressing in Place as Prescribed: Yes Thinner left ABI 1.0; rt ABI Has Compression in Place as Prescribed: Yes Pain Present Now: No 1.09 Electronic Signature(s) Signed: 02/17/2019 5:02:12 PM By: Deon Pilling Entered By: Deon Pilling on 02/17/2019 10:04:05 -------------------------------------------------------------------------------- Encounter Discharge Information Details Patient Name: Date of Service: [name] [name] 02/17/2019 9:30 AM [name] [id number] Patient Account Number: [id number] Date of Birth/Sex: Treating RN: March 05, 1946 (73 y.o. [name] [name] Primary Care Mylen Mangan: [name], [name] Other Clinician: Referring Keia Rask: Treating Gildardo Tickner/Extender:[name], [name] [name], [name]  in Treatment: [phone] Encounter Discharge Information Items Post Procedure Vitals Discharge Condition: Stable Temperature (F): 98.2 Ambulatory Status: Wheelchair Pulse (bpm): 69 Discharge Destination: Home Respiratory Rate (breaths/min): 16 Transportation: Private Auto Blood Pressure (mmHg): 104/58 Accompanied By: self Schedule Follow-up Appointment: Yes Clinical Summary of Care: Electronic Signature(s) Signed: 02/17/2019 5:02:12 PM By: Deon Pilling Entered By: Deon Pilling on 02/17/2019 11:13:40 -------------------------------------------------------------------------------- Lower Extremity Assessment Details Patient Name: Date of Service: ADNAAN, MOWAD 02/17/2019 9:30 AM [name] [id number] Patient Account Number: [id number] Date of Birth/Sex: Treating RN: 01/29/1946 (73 y.o. [name] [name] Primary Care Liliyana Thobe: [name], [name] Other Clinician: Referring Vaniyah Lansky: Treating Audris Speaker/Extender:[name], [name] [name], [name] in Treatment: 148 Edema Assessment Assessed: [Left: Yes] [Right: No] Edema: [Left: N] [Right: o] Calf Left: Right: Point of Measurement: 36 cm From Medial Instep 22.5 cm cm Ankle Left: Right: Point of Measurement: 11 cm From Medial Instep 17 cm cm Electronic Signature(s) Signed: 02/17/2019 5:02:12 PM By: Deon Pilling Entered By: Deon Pilling on 02/17/2019 10:04:37 -------------------------------------------------------------------------------- Multi Wound Chart Details Patient Name: Date of Service: [name] [name] 02/17/2019 9:30 AM [name] [id number] Patient Account Number: [id number] Date of Birth/Sex: Treating RN: [birth year] (73 y.o. [name] Primary Care Perseus Westall: [name], [name] Other Clinician: Referring Janiene Aarons: Treating Sahmir Weatherbee/Extender:[name], [name] [name], [name] in Treatment: 148 Vital Signs Height(in): 74 Capillary Blood 109 Glucose(mg/dl): Weight(lbs): 150 Pulse(bpm): 77 Body  Mass Index(BMI): 19 Blood Pressure(mmHg): 104/58 Temperature(F): 98.2 Respiratory 16 Rate(breaths/min): Photos: [14:No Photos] [18:No Photos] [19:No Photos] Wound Location: [14:Left Lower Leg - Lateral] [18:Left Foot - Dorsal] [19:Left, Anterior Lower Leg] Wounding Event: [14:Gradually Appeared] [18:Gradually Appeared] [19:Gradually Appeared] Primary Etiology: [14:Diabetic Wound/Ulcer of the Diabetic Wound/Ulcer of the Diabetic Wound/Ulcer of the Lower Extremity] [18:Lower Extremity] [19:Lower Extremity] Secondary Etiology: [14:N/A] [18:N/A] [19:Venous Leg Ulcer] Comorbid History: [14:Anemia, Sleep Apnea, Arrhythmia, Congestive Heart Failure, Coronary Artery Disease, Hypertension, Myocardial Hypertension, Myocardial Infarction, Type II Diabetes, Infarction, Type II Diabetes, Gout, Neuropathy] [18:Anemia, Sleep  Apnea, Arrhythmia, Congestive Heart Failure, Coronary Artery Disease, Gout, Neuropathy] [  F. 02/17/2019 9:30 AM [name] [id number] Patient Account Number: [id number] Date of Birth/Sex: Treating RN: Sep 05, 1945 (73 y.o. [name] [name] Primary Care Paige Monarrez: [name], [name] Other Clinician: Referring Gawain Crombie: Treating Aalaiyah Yassin/Extender:[name], [name] [name], [name] in Treatment: 148 Wound Status Wound Number: 14 Primary Diabetic Wound/Ulcer of the Lower Extremity Etiology: Wound Location: Left Lower Leg - Lateral Wound Open Wounding Event: Gradually Appeared Status: Date Acquired: 08/15/2017 Comorbid Anemia, Sleep Apnea, Arrhythmia, Congestive Weeks Of Treatment: 78 History: Heart Failure, Coronary Artery Disease, Clustered Wound: Yes Hypertension, Myocardial Infarction, Type II Diabetes, Gout, Neuropathy Photos Wound Measurements Length: (cm) 5.1 % Reducti Width: (cm) 0.6 % Reducti Depth: (cm)  0.3 Epithelia Clustered Quantity: 1 Tunneling Area: (cm) 2.403 Undermin Volume: (cm) 0.721 Wound Description Classification: Grade 2 Wound Margin: Flat and Intact Exudate Amount: Medium Exudate Type: Serosanguineous Exudate Color: red, brown Wound Bed Granulation Amount: Large (67-100%) Granulation Quality: Red, Pink, Hyper-granulation Necrotic Amount: Small (1-33%) Necrotic Quality: Adherent Slough Foul Odor After Cleansing: No Slough/Fibrino Yes Exposed Structure Fascia Exposed: No Fat Layer (Subcutaneous Tissue) Exposed: Yes Tendon Exposed: No Muscle Exposed: No Joint Exposed: No Bone Exposed: No on in Area: -155.1% on in Volume: -667% lization: Medium (34-66%) : No ing: No Electronic Signature(s) Signed: 02/19/2019 3:04:52 PM By: Deon Pilling Signed: 02/25/2019 4:01:07 PM By: Mikeal Hawthorne EMT/HBOT Previous Signature: 02/17/2019 5:02:12 PM Version By: Deon Pilling Entered By: Mikeal Hawthorne on 02/19/2019 08:04:46 -------------------------------------------------------------------------------- Wound Assessment Details Patient Name: Date of Service: [name] [name] 02/17/2019 9:30 AM [name] [id number] Patient Account Number: [id number] Date of Birth/Sex: Treating RN: 09/17/1945 (73 y.o. [name] [name] Primary Care Arael Piccione: [name], [name] Other Clinician: Referring Ashely Goosby: Treating [place] Welcome/Extender:[name], [name] [name], [name] in Treatment: 148 Wound Status Wound Number: 18 Primary Diabetic Wound/Ulcer of the Lower Extremity Etiology: Wound Location: Left Foot - Dorsal Wound Open Wounding Event: Gradually Appeared Status: Date Acquired: 08/26/2018 Comorbid Anemia, Sleep Apnea, Arrhythmia, Congestive Weeks Of Treatment: 25 History: Heart Failure, Coronary Artery Disease, Clustered Wound: No Hypertension, Myocardial Infarction, Type II Diabetes, Gout, Neuropathy Photos Wound Measurements Length: (cm) 3.4 % Reductio Width:  (cm) 2 % Reductio Depth: (cm) 0.1 Epithelial Area: (cm) 5.341 Tunneling Volume: (cm) 0.534 Undermini Wound Description Classification: Grade 2 Wound Margin: Well defined, not attached Exudate Amount: Medium Exudate Type: Serosanguineous Exudate Color: red, brown Wound Bed Granulation Amount: Small (1-33%) Granulation Quality: Pink Necrotic Amount: Large (67-100%) Necrotic Quality: Eschar, Adherent Slough Foul Odor After Cleansing: No Slough/Fibrino Yes Exposed Structure Fascia Exposed: No Fat Layer (Subcutaneous Tissue) Exposed: Yes Tendon Exposed: Yes Muscle Exposed: Yes Necrosis of Muscle: Yes Joint Exposed: No Bone Exposed: No n in Area: -1601% n in Volume: -1622.6% ization: Small (1-33%) : No ng: No Electronic Signature(s) Signed: 02/19/2019 3:04:52 PM By: Deon Pilling Signed: 02/25/2019 4:01:07 PM By: Mikeal Hawthorne EMT/HBOT Previous Signature: 02/17/2019 5:02:12 PM Version By: Deon Pilling Entered By: Mikeal Hawthorne on 02/19/2019 08:09:41 -------------------------------------------------------------------------------- Wound Assessment Details Patient Name: Date of Service: [name] [name] 02/17/2019 9:30 AM [name] [id number] Patient Account Number: [id number] Date of Birth/Sex: Treating RN: 1945/11/01 (73 y.o. [name] [name] Primary Care Jeania Nater: [name], [name] Other Clinician: Referring Shawnte Demarest: Treating Nicola Quesnell/Extender:[name], [name] [name], [name] in Treatment: 148 Wound Status Wound Number: 19 Primary Diabetic Wound/Ulcer of the Lower Extremity Etiology: Wound Location: Left Lower Leg - Anterior Secondary Venous Leg Ulcer Wounding Event: Gradually Appeared Etiology: Date Acquired: 01/06/2019 Wound Healed - Epithelialized Weeks Of Treatment: 6 Status: Clustered Wound: No Comorbid Anemia, Sleep Apnea, Arrhythmia, History: Congestive Heart  3:04:52 PM By: Deon Pilling Signed: 02/25/2019 4:01:07 PM By: Mikeal Hawthorne EMT/HBOT Previous Signature: 02/17/2019 5:02:12 PM Version By: Deon Pilling Entered By: Mikeal Hawthorne on 02/19/2019 08:05:19 -------------------------------------------------------------------------------- Wound Assessment Details Patient Name: Date of Service: [name] [name] 02/17/2019 9:30 AM [name] [id number] Patient Account Number: [id number] Date of Birth/Sex: Treating RN: 1946/01/03 (73 y.o. [name] Primary Care Enrico Eaddy: [name], [name] Other Clinician: Referring Kelce Bouton: Treating Lashona Schaaf/Extender:[name], [name] [name], [name] in Treatment: 148 Wound Status Wound Number: 22 Primary Pressure Ulcer Etiology: Wound Location: Left Toe Great - Distal Wound Open Wounding Event: Pressure Injury Status: Date Acquired: 02/03/2019 Comorbid Anemia, Sleep Apnea, Arrhythmia, Congestive Weeks Of Treatment: 0 History: Heart Failure, Coronary Artery Disease, Clustered Wound: No Hypertension, Myocardial Infarction, Type II Diabetes, Gout, Neuropathy Photos Wound Measurements Length: (cm) 0.5 Width: (cm) 1.4 Depth: (cm) 0.1 Area: (cm) 0.55 Volume: (cm) 0.055 Wound Description Classification: Category/Stage II Exudate Amount: None Present Foul Odor After Cleansing: No Slough/Fibrino Yes % Reduction in Area: 0% % Reduction in Volume: 0% Tunneling:  No Undermining: No Wound Bed Granulation Amount: None Present (0%) Exposed Structure Necrotic Amount: Large (67-100%) Fascia Exposed: No Necrotic Quality: Eschar, Adherent Slough Fat Layer (Subcutaneous Tissue) Exposed: No Tendon Exposed: No Muscle Exposed: No Joint Exposed: No Bone Exposed: No Electronic Signature(s) Signed: 02/25/2019 4:01:07 PM By: Mikeal Hawthorne EMT/HBOT Signed: 03/31/2019 3:01:15 PM By: [name] RN Entered By: Mikeal Hawthorne on 02/19/2019 08:10:29 -------------------------------------------------------------------------------- Vitals Details Patient Name: Date of Service: [name] [name]. 02/17/2019 9:30 AM [name] [id number] Patient Account Number: [id number] Date of Birth/Sex: Treating RN: [birth year] (73 y.o. [name] [name] Primary Care Jeffren Dombek: [name], [name] Other Clinician: Referring Verleen Stuckey: Treating Shyanna Klingel/Extender:[name], [name] [name], [name] in Treatment: 148 Vital Signs Time Taken: 10:10 Temperature (F): 98.2 Height (in): 74 Pulse (bpm): 69 Weight (lbs): 150 Respiratory Rate (breaths/min): 16 Body Mass Index (BMI): 19.3 Blood Pressure (mmHg): 104/58 Capillary Blood Glucose (mg/dl): 109 Reference Range: 80 - 120 mg / dl Electronic Signature(s) Signed: 02/17/2019 5:02:12 PM By: Deon Pilling Entered By: Deon Pilling on 02/17/2019 10:13:02  3:04:52 PM By: Deon Pilling Signed: 02/25/2019 4:01:07 PM By: Mikeal Hawthorne EMT/HBOT Previous Signature: 02/17/2019 5:02:12 PM Version By: Deon Pilling Entered By: Mikeal Hawthorne on 02/19/2019 08:05:19 -------------------------------------------------------------------------------- Wound Assessment Details Patient Name: Date of Service: [name] [name] 02/17/2019 9:30 AM [name] [id number] Patient Account Number: [id number] Date of Birth/Sex: Treating RN: 1946/01/03 (73 y.o. [name] Primary Care Enrico Eaddy: [name], [name] Other Clinician: Referring Kelce Bouton: Treating Lashona Schaaf/Extender:[name], [name] [name], [name] in Treatment: 148 Wound Status Wound Number: 22 Primary Pressure Ulcer Etiology: Wound Location: Left Toe Great - Distal Wound Open Wounding Event: Pressure Injury Status: Date Acquired: 02/03/2019 Comorbid Anemia, Sleep Apnea, Arrhythmia, Congestive Weeks Of Treatment: 0 History: Heart Failure, Coronary Artery Disease, Clustered Wound: No Hypertension, Myocardial Infarction, Type II Diabetes, Gout, Neuropathy Photos Wound Measurements Length: (cm) 0.5 Width: (cm) 1.4 Depth: (cm) 0.1 Area: (cm) 0.55 Volume: (cm) 0.055 Wound Description Classification: Category/Stage II Exudate Amount: None Present Foul Odor After Cleansing: No Slough/Fibrino Yes % Reduction in Area: 0% % Reduction in Volume: 0% Tunneling:  No Undermining: No Wound Bed Granulation Amount: None Present (0%) Exposed Structure Necrotic Amount: Large (67-100%) Fascia Exposed: No Necrotic Quality: Eschar, Adherent Slough Fat Layer (Subcutaneous Tissue) Exposed: No Tendon Exposed: No Muscle Exposed: No Joint Exposed: No Bone Exposed: No Electronic Signature(s) Signed: 02/25/2019 4:01:07 PM By: Mikeal Hawthorne EMT/HBOT Signed: 03/31/2019 3:01:15 PM By: [name] RN Entered By: Mikeal Hawthorne on 02/19/2019 08:10:29 -------------------------------------------------------------------------------- Vitals Details Patient Name: Date of Service: [name] [name]. 02/17/2019 9:30 AM [name] [id number] Patient Account Number: [id number] Date of Birth/Sex: Treating RN: [birth year] (73 y.o. [name] [name] Primary Care Jeffren Dombek: [name], [name] Other Clinician: Referring Verleen Stuckey: Treating Shyanna Klingel/Extender:[name], [name] [name], [name] in Treatment: 148 Vital Signs Time Taken: 10:10 Temperature (F): 98.2 Height (in): 74 Pulse (bpm): 69 Weight (lbs): 150 Respiratory Rate (breaths/min): 16 Body Mass Index (BMI): 19.3 Blood Pressure (mmHg): 104/58 Capillary Blood Glucose (mg/dl): 109 Reference Range: 80 - 120 mg / dl Electronic Signature(s) Signed: 02/17/2019 5:02:12 PM By: Deon Pilling Entered By: Deon Pilling on 02/17/2019 10:13:02  F. 02/17/2019 9:30 AM [name] [id number] Patient Account Number: [id number] Date of Birth/Sex: Treating RN: Sep 05, 1945 (73 y.o. [name] [name] Primary Care Paige Monarrez: [name], [name] Other Clinician: Referring Gawain Crombie: Treating Aalaiyah Yassin/Extender:[name], [name] [name], [name] in Treatment: 148 Wound Status Wound Number: 14 Primary Diabetic Wound/Ulcer of the Lower Extremity Etiology: Wound Location: Left Lower Leg - Lateral Wound Open Wounding Event: Gradually Appeared Status: Date Acquired: 08/15/2017 Comorbid Anemia, Sleep Apnea, Arrhythmia, Congestive Weeks Of Treatment: 78 History: Heart Failure, Coronary Artery Disease, Clustered Wound: Yes Hypertension, Myocardial Infarction, Type II Diabetes, Gout, Neuropathy Photos Wound Measurements Length: (cm) 5.1 % Reducti Width: (cm) 0.6 % Reducti Depth: (cm)  0.3 Epithelia Clustered Quantity: 1 Tunneling Area: (cm) 2.403 Undermin Volume: (cm) 0.721 Wound Description Classification: Grade 2 Wound Margin: Flat and Intact Exudate Amount: Medium Exudate Type: Serosanguineous Exudate Color: red, brown Wound Bed Granulation Amount: Large (67-100%) Granulation Quality: Red, Pink, Hyper-granulation Necrotic Amount: Small (1-33%) Necrotic Quality: Adherent Slough Foul Odor After Cleansing: No Slough/Fibrino Yes Exposed Structure Fascia Exposed: No Fat Layer (Subcutaneous Tissue) Exposed: Yes Tendon Exposed: No Muscle Exposed: No Joint Exposed: No Bone Exposed: No on in Area: -155.1% on in Volume: -667% lization: Medium (34-66%) : No ing: No Electronic Signature(s) Signed: 02/19/2019 3:04:52 PM By: Deon Pilling Signed: 02/25/2019 4:01:07 PM By: Mikeal Hawthorne EMT/HBOT Previous Signature: 02/17/2019 5:02:12 PM Version By: Deon Pilling Entered By: Mikeal Hawthorne on 02/19/2019 08:04:46 -------------------------------------------------------------------------------- Wound Assessment Details Patient Name: Date of Service: [name] [name] 02/17/2019 9:30 AM [name] [id number] Patient Account Number: [id number] Date of Birth/Sex: Treating RN: 09/17/1945 (73 y.o. [name] [name] Primary Care Arael Piccione: [name], [name] Other Clinician: Referring Ashely Goosby: Treating [place] Welcome/Extender:[name], [name] [name], [name] in Treatment: 148 Wound Status Wound Number: 18 Primary Diabetic Wound/Ulcer of the Lower Extremity Etiology: Wound Location: Left Foot - Dorsal Wound Open Wounding Event: Gradually Appeared Status: Date Acquired: 08/26/2018 Comorbid Anemia, Sleep Apnea, Arrhythmia, Congestive Weeks Of Treatment: 25 History: Heart Failure, Coronary Artery Disease, Clustered Wound: No Hypertension, Myocardial Infarction, Type II Diabetes, Gout, Neuropathy Photos Wound Measurements Length: (cm) 3.4 % Reductio Width:  (cm) 2 % Reductio Depth: (cm) 0.1 Epithelial Area: (cm) 5.341 Tunneling Volume: (cm) 0.534 Undermini Wound Description Classification: Grade 2 Wound Margin: Well defined, not attached Exudate Amount: Medium Exudate Type: Serosanguineous Exudate Color: red, brown Wound Bed Granulation Amount: Small (1-33%) Granulation Quality: Pink Necrotic Amount: Large (67-100%) Necrotic Quality: Eschar, Adherent Slough Foul Odor After Cleansing: No Slough/Fibrino Yes Exposed Structure Fascia Exposed: No Fat Layer (Subcutaneous Tissue) Exposed: Yes Tendon Exposed: Yes Muscle Exposed: Yes Necrosis of Muscle: Yes Joint Exposed: No Bone Exposed: No n in Area: -1601% n in Volume: -1622.6% ization: Small (1-33%) : No ng: No Electronic Signature(s) Signed: 02/19/2019 3:04:52 PM By: Deon Pilling Signed: 02/25/2019 4:01:07 PM By: Mikeal Hawthorne EMT/HBOT Previous Signature: 02/17/2019 5:02:12 PM Version By: Deon Pilling Entered By: Mikeal Hawthorne on 02/19/2019 08:09:41 -------------------------------------------------------------------------------- Wound Assessment Details Patient Name: Date of Service: [name] [name] 02/17/2019 9:30 AM [name] [id number] Patient Account Number: [id number] Date of Birth/Sex: Treating RN: 1945/11/01 (73 y.o. [name] [name] Primary Care Jeania Nater: [name], [name] Other Clinician: Referring Shawnte Demarest: Treating Nicola Quesnell/Extender:[name], [name] [name], [name] in Treatment: 148 Wound Status Wound Number: 19 Primary Diabetic Wound/Ulcer of the Lower Extremity Etiology: Wound Location: Left Lower Leg - Anterior Secondary Venous Leg Ulcer Wounding Event: Gradually Appeared Etiology: Date Acquired: 01/06/2019 Wound Healed - Epithelialized Weeks Of Treatment: 6 Status: Clustered Wound: No Comorbid Anemia, Sleep Apnea, Arrhythmia, History: Congestive Heart

## 2019-03-31 NOTE — Progress Notes (Signed)
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 pressure was normal at 0.97. Felt to have mild left lower extremity arterial disease. Probably not contributing any major way to the difficulty healing these wounds 7/14; the patient's wound on the left lateral calf perhaps slightly improved  although he once again has this rectangular area of erythema. The cause behind this is unclear. He has another area on the dorsal left foot again with erythema around this. This does not look like cellulitis is and not tender. Not have an arterial issue 7/28; I do not see much change in the wounds at all left lateral calf about the same. The dorsal ankle area which may have been wrap injury necrotic debris over the surface. He has a small area draining on the anterior calf tibial area. 8/18; absolutely no improvement in either area. Left lateral calf along the lateral part of the tibia slightly longer and slightly narrower. He now has exposed tendon in the dorsal foot which may have initially been a wrap injury. He does not have obvious arterial insufficiency. I reviewed his arterial studies today these showed some tibial vessel disease however ABIs and TBI's were normal. He has [employer] we have not been able to get advanced treatment products 9/15; 1 month follow-up. There is improvement in the left lateral calf along the lateral part of the tibia. This appears to be largely epithelialized some mild erythema around this but no pain ooHe has a new area in the left mid tibia which is a small wound he says was there last time but I do not know that we do find this may have been eschared over scab. ooHe has a paronychia I on the base of the left first toe with erythema. This is not painful but he is reasonably insensate ooFinally the area on the left dorsal ankle is mostly exposed tendon which ultimately going to need to be removed. ooWe have been using silver collagen in all wound areas 10/6; 3-week follow-up. There is been absolutely no improvement in the left lateral calf. This is a major deterioration from when he was here 3 weeks ago. In addition he has 2 superficial areas one above this and one below. The area over the dorsal foot has necrotic tendon which I removed. We have been using  silver collagen I have reviewed the patient's arterial studies from June he does have tibial vessel disease based on monophasic waveforms although his ABIs were within normal limits. I did not think at that time that that anything to do with the upper left tibial wound. 10/27; this is a 3-week follow-up. The left lateral calf actually looks some better. Still open but generally smaller with a healthy looking surface. I believe I gave him a course of antibiotics when he was here last time. -He also has the area on the dorsal ankle. Again an extensive debridement required here today ooFinally has a new wound on the left great toe dorsally. He is not precisely aware when  this occurred. ooUsing Hydrofera Blue on all wound area 11/17; 3-week follow-up. Left lateral calf certainly not any smaller. May be somewhat larger. He has a large area on the dorsal ankle which I thought was an initial wrap injury covered in necrotic debris also an area on the left great toe dorsally 12/8; 3-week follow-up. Left lateral calf perhaps somewhat less wide he has 2 areas here which concerned me. He has the area on the left dorsal foot/ankle necrotic debris over the surface and then an area on the left great toe. ooHe comes in this week with excoriations right down his anterior tibia. He is unaware of how this happened. Some erythema around the left lateral calf although no evidence of cellulitis He had arterial studies that suggested only mild arterial disease. I am not sure whether he has had reflux studies or not. I will need to research this Objective Constitutional Sitting or standing Blood Pressure is within target range for patient.. Pulse regular and within target range for patient.Marland Kitchen Respirations regular, non-labored and within target range.. Temperature is normal and within the target range for the patient.Marland Kitchen Appears in no distress. Vitals Time Taken: 10:08 AM, Height: 74 in, Weight: 150 lbs, BMI: 19.3,  Temperature: 97.7 F, Pulse: 75 bpm, Respiratory Rate: 20 breaths/min, Blood Pressure: 130/71 mmHg, Capillary Blood Glucose: 108 mg/dl. Respiratory work of breathing is normal. General Notes: Wound exam; left lateral lower extremity parallel to the tibia. Debrided on the surface debrided with a #5 curette. He has 2 open wounds here ooDorsal right foot and ankle. Necrotic debris removed with a #5 curette hemostasis with direct pressure. ooLeft great toe small wound but no evidence of infection. No debridement necessary Integumentary (Hair, Skin) Wound #14 status is Open. Original cause of wound was Gradually Appeared. The wound is located on the Left,Lateral Lower Leg. The wound measures 8.2cm length x 0.5cm width x 0.1cm depth; 3.22cm^2 area and 0.322cm^3 volume. There is Fat Layer (Subcutaneous Tissue) Exposed exposed. There is no tunneling or undermining noted. There is a medium amount of serosanguineous drainage noted. The wound margin is well defined and not attached to the wound base. There is large (67-100%) red, pink granulation within the wound bed. There is a small (1-33%) amount of necrotic tissue within the wound bed including Adherent Slough. Wound #18 status is Open. Original cause of wound was Gradually Appeared. The wound is located on the Left,Dorsal Foot. The wound measures 3.8cm length x 1.4cm width x 0.1cm depth; 4.178cm^2 area and 0.418cm^3 volume. There is muscle, tendon, and Fat Layer (Subcutaneous Tissue) Exposed exposed. There is no tunneling or undermining noted. There is a medium amount of serosanguineous drainage noted. The wound margin is well defined and not attached to the wound base. There is medium (34-66%) pink granulation within the wound bed. There is a medium (34-66%) amount of necrotic tissue within the wound bed including Eschar, Adherent Slough and Necrosis of Muscle. Wound #22 status is Open. Original cause of wound was Pressure Injury. The wound is  located on the [employer]. The wound measures 0.4cm length x 0.9cm width x 0.1cm depth; 0.283cm^2 area and 0.028cm^3 volume. There is Fat Layer (Subcutaneous Tissue) Exposed exposed. There is no tunneling or undermining noted. There is a medium amount of serosanguineous drainage noted. The wound margin is distinct with the outline attached to the wound base. There is medium (34-66%) pink granulation within the wound bed. There is a medium (34-66%) amount of necrotic tissue within the wound bed  including Brilliant. 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 There was a Excisional Skin/Subcutaneous Tissue Debridement with a total area of 5.32 sq cm performed by [name], MD. With the following instrument(s): Curette to remove Viable and Non-Viable tissue/material. Material removed includes Subcutaneous Tissue after achieving pain control using Lidocaine 5% topical ointment. No specimens were taken. A time out was conducted at 11:25, prior to the start of the procedure. A Moderate amount of bleeding was controlled with Pressure. The procedure was tolerated well with a pain level of 0 throughout and a pain level of 0 following the procedure. Post Debridement Measurements: 3.8cm length x 1.4cm width x 0.1cm depth; 0.418cm^3 volume. Character of Wound/Ulcer Post Debridement is improved. Severity of Tissue Post Debridement is: Fat layer exposed. Post procedure Diagnosis Wound #18:  Same as Pre-Procedure Plan Follow-up Appointments: Return appointment in 1 month. Dressing Change Frequency: Change dressing three times week. Skin Barriers/Peri-Wound Care: Other: - TCA to left lower extrimity Wound Cleansing: Clean wound with Wound Cleanser May shower with protection. Primary Wound Dressing: Wound #14 Left,Lateral Lower Leg: Other: - sorbact Wound #18 Left,Dorsal Foot: Other: - sorbact Wound #22 Left,Distal Toe Great: Other: - sorbact Secondary Dressing: Wound #14 Left,Lateral Lower Leg: Dry Gauze Wound #18 Left,Dorsal Foot: Dry Gauze Wound #22 Left,Distal Toe Great: Dry Gauze Edema Control: Kerlix and Coban - Left Lower Extremity - use cotton roll not kerlix Avoid standing for long periods of time Elevate legs to the level of the heart or above for 30 minutes daily and/or when sitting, a frequency of: - throughout the day 1. Continue Sorbac to all wounds 2. TCA to the area of the mid tibia. We talked about transitioning him from kerlix to cotton to see if this was some form of contact dermatitis. 3. He is still using steroid for the venous insufficiency/stasis dermatitis Electronic Signature(s) Signed: 03/31/2019 5:53:37 PM By: Linton Ham MD Entered By: Linton Ham on 03/31/2019 13:10:03 -------------------------------------------------------------------------------- SuperBill Details Patient Name: Date of Service: [name] [name] Record 03/31/2019 Medical Record [id number] Patient Account Number: [id number] Date of Birth/Sex: Treating RN: 05/27/1945 (73 y.o. M) Primary Care Provider: [name], [name] Other Clinician: Referring Provider: Treating Provider/Extender:[name], [name] [name], [name] in Treatment: 154 Diagnosis Coding ICD-10 Codes Code Description [phone] Non-pressure chronic ulcer of left calf limited to breakdown of skin L97.321 Non-pressure chronic ulcer of left ankle limited to breakdown of skin L97.821 Non-pressure  chronic ulcer of other part of left lower leg limited to breakdown of skin L97.521 Non-pressure chronic ulcer of other part of left foot limited to breakdown of skin Facility Procedures CPT4 Code Description: [id number] - DEB SUBQ TISSUE 20 SQ CM/< ICD-10 Diagnosis Description L97.321 Non-pressure chronic ulcer of left ankle limited to breakdo Modifier: wn of skin Quantity: 1 Physician Procedures Electronic Signature(s) Signed: 03/31/2019 5:53:37 PM By: Linton Ham MD Entered By: Linton Ham on 03/31/2019 13:10:22

## 2019-03-31 NOTE — Progress Notes (Signed)
[name] [name], [name] [name] ([id number]) Visit Report for 12/09/2018 Arrival Information Details Patient Name: Date of Service: [name] [name], [name] [name] 12/09/2018 10:30 AM [name] [id number] Patient Account Number: [id number] Date of Birth/Sex: Treating RN: 11/17/1945 (73 y.o. [name] [name] Primary Care Ragena Fiola: [name], [name] Other Clinician: Referring Jeffrey Graefe: Treating Abbigal Radich/Extender:[name], [name] [name], [name] in Treatment: 138 Visit Information History Since Last Visit Added or deleted any medications: No Patient Arrived: Wheel Chair Any new allergies or adverse reactions: No Arrival Time: 10:39 Had a fall or experienced change in No Accompanied By: self activities of daily living that may affect Transfer Assistance: None risk of falls: Patient Identification Verified: Yes Signs or symptoms of abuse/neglect since last No Secondary Verification Process Yes visito Completed: Hospitalized since last visit: No Patient Requires Transmission- No Has Dressing in Place as Prescribed: Yes Based Precautions: Has Compression in Place as Prescribed: Yes Patient Has Alerts: Yes Pain Present Now: No Patient Alerts: Patient on Blood Thinner left ABI 1.0; rt ABI 1.09 Electronic Signature(s) Signed: 12/09/2018 5:16:36 PM By: Baruch Gouty RN, BSN Entered By: Baruch Gouty on 12/09/2018 10:44:39 -------------------------------------------------------------------------------- Lower Extremity Assessment Details Patient Name: Date of Service: [name] [name]. 12/09/2018 10:30 AM [name] [id number] Patient Account Number: [id number] Date of Birth/Sex: Treating RN: 06/11/1945 (73 y.o. [name] [name] Primary Care Nysha Koplin: [name], [name] Other Clinician: Referring Veto Macqueen: Treating Dniyah Grant/Extender:[name], [name] [name], [name] in Treatment: 138 Edema Assessment Assessed: [Left: No] [Right: No] Edema: [Left: N] [Right: o] Calf Left:  Right: Point of Measurement: 36 cm From Medial Instep 23 cm cm Ankle Left: Right: Point of Measurement: 11 cm From Medial Instep 18.5 cm cm Vascular Assessment Pulses: Dorsalis Pedis Palpable: [Left:Yes] Electronic Signature(s) Signed: 12/09/2018 5:16:36 PM By: Baruch Gouty RN, BSN Entered By: Baruch Gouty on 12/09/2018 10:55:13 -------------------------------------------------------------------------------- Multi Wound Chart Details Patient Name: Date of Service: [name] [name]. 12/09/2018 10:30 AM [name] [id number] Patient Account Number: [id number] Date of Birth/Sex: Treating RN: [birth year] (73 y.o. [name] [name] Primary Care Paizleigh Wilds: [name], [name] Other Clinician: Referring Bonham Zingale: Treating Advait Buice/Extender:[name], [name] [name], [name] in Treatment: 138 Vital Signs Height(in): 74 Capillary Blood 109 Glucose(mg/dl): Weight(lbs): 150 Pulse(bpm): 65 Body Mass Index(BMI): 19 Blood Pressure(mmHg): 147/83 Temperature(F): 98.2 Respiratory 18 Rate(breaths/min): Photos: [14:No Photos] [18:No Photos] [N/A:N/A] Wound Location: [14:Left Lower Leg - Lateral] [18:Left Foot - Dorsal] [N/A:N/A] Wounding Event: [14:Gradually Appeared] [18:Gradually Appeared] [N/A:N/A] Primary Etiology: [14:Diabetic Wound/Ulcer of the Diabetic Wound/Ulcer of the N/A Lower Extremity] [18:Lower Extremity] Comorbid History: [14:Anemia, Sleep Apnea, Arrhythmia, Congestive Heart Failure, Coronary Artery Disease, Hypertension, Myocardial Hypertension, Myocardial Infarction, Type II Diabetes, Infarction, Type II Diabetes, Gout, Neuropathy] [18:Anemia, Sleep  Apnea, Arrhythmia, Congestive Heart Failure, Coronary Artery Disease, Gout, Neuropathy] [N/A:N/A] Date Acquired: [14:08/15/2017] [18:08/26/2018] [N/A:N/A] Weeks of Treatment: [14:68] [18:15] [N/A:N/A] Wound Status: [14:Open] [18:Open] [N/A:N/A] Clustered Wound: [14:Yes] [18:No] [N/A:N/A] Clustered Quantity: [14:1] [18:N/A]  [N/A:N/A] Measurements L x W x D [14:7.4x1x0.1] [18:2x1.5x0.1] [N/A:N/A] (cm) Area (cm) : [14:5.812] [18:2.356] [N/A:N/A] Volume (cm) : [14:0.581] [18:0.236] [N/A:N/A] % Reduction in Area: [14:-517.00%] [18:-650.30%] [N/A:N/A] % Reduction in Volume: [14:-518.10%] [18:-661.30%] [N/A:N/A] Classification: [14:Grade 2] [18:Grade 2] [N/A:N/A] Exudate Amount: [14:Medium] [18:Medium] [N/A:N/A] Exudate Type: [14:Serosanguineous] [18:Serosanguineous] [N/A:N/A] Exudate Color: [14:red, brown] [18:red, brown] [N/A:N/A] Wound Margin: [14:Flat and Intact] [18:Flat and Intact] [N/A:N/A] Granulation Amount: [14:Medium (34-66%)] [18:Small (1-33%)] [N/A:N/A] Granulation Quality: [14:Red] [18:Pink] [N/A:N/A] Necrotic Amount: [14:Medium (34-66%)] [18:Large (67-100%)] [N/A:N/A] Exposed Structures: [14:Fat Layer (Subcutaneous Tissue) Exposed: Yes Fascia: No Tendon: No Muscle: No Joint: No Bone: No] [18:Fat Layer (Subcutaneous Tissue) Exposed:  Yes Tendon: Yes Fascia: No Muscle: No Joint: No Bone: No] [N/A:N/A] Epithelialization: [14:Medium (34-66%)] [18:Small (1-33%)] [N/A:N/A] Debridement: [14:Debridement - Excisional] [18:Debridement - Excisional] [N/A:N/A] Pre-procedure [14:11:34] [18:11:34] [N/A:N/A] Verification/Time Out Taken: Pain Control: [14:Lidocaine 5% topical ointment] [18:Lidocaine 5% topical ointment] [N/A:N/A] Tissue Debrided: [14:Subcutaneous, Slough] [18:Subcutaneous, Slough] [N/A:N/A] Level: [14:Skin/Subcutaneous Tissue] [18:Skin/Subcutaneous Tissue] [N/A:N/A] Debridement Area (sq cm):7.4 [18:3] [N/A:N/A] Instrument: [14:Curette] [18:Curette] [N/A:N/A] Bleeding: [14:Minimum] [18:Minimum] [N/A:N/A] Hemostasis Achieved: [14:Pressure] [18:Pressure] [N/A:N/A] Procedural Pain: [14:0] [18:0] [N/A:N/A] Post Procedural Pain: [14:0] [18:0] [N/A:N/A] Debridement Treatment Procedure was tolerated [18:Procedure was tolerated] [N/A:N/A] Response: [14:well] [18:well] Post Debridement [14:7.4x1x0.1]  [18:2x1.5x0.1] [N/A:N/A] Measurements L x W x D (cm) Post Debridement [14:0.581] [18:0.236] [N/A:N/A] Volume: (cm) Procedures Performed: Debridement [18:Debridement] [N/A:N/A] Treatment Notes Electronic Signature(s) Signed: 12/09/2018 5:30:55 PM By: Linton Ham MD Signed: 03/31/2019 3:07:45 PM By: [name] RN Entered By: Linton Ham on 12/09/2018 12:09:04 -------------------------------------------------------------------------------- Multi-Disciplinary Care Plan Details Patient Name: Date of Service: [name] [name]. 12/09/2018 10:30 AM [name] [id number] Patient Account Number: [id number] Date of Birth/Sex: Treating RN: [birth year] (73 y.o. [name] [name] Primary Care Nicholous Girgenti: [name], [name] Other Clinician: Referring Tremar Wickens: Treating Elizjah Noblet/Extender:[name], [name] [name], [name] in Treatment: 138 Active Inactive Wound/Skin Impairment Nursing Diagnoses: Impaired tissue integrity Goals: Patient/caregiver will verbalize understanding of skin care regimen Date Initiated: 11/19/2017 Target Resolution Date: 01/02/2019 Goal Status: Active Ulcer/skin breakdown will have a volume reduction of 30% by week 4 Date Initiated: 11/19/2017 Date Inactivated: 12/02/2017 Target Resolution Date: 12/22/2017 Unmet Reason: multipl Goal Status: Unmet comorbidities Interventions: Assess patient/caregiver ability to obtain necessary supplies Assess ulceration(s) every visit Provide education on ulcer and skin care Notes: Electronic Signature(s) Signed: 03/31/2019 3:07:45 PM By: [name] RN Entered By: [name] on 12/09/2018 10:23:49 -------------------------------------------------------------------------------- Pain Assessment Details Patient Name: Date of Service: [name] [name], [name] [name] 12/09/2018 10:30 AM [name] [id number] Patient Account Number: [id number] Date of Birth/Sex: Treating RN: 1945-12-26 (73 y.o. [name] [name] Primary Care  Regana Kemple: [name], [name] Other Clinician: Referring Anzlee Hinesley: Treating Armandina Iman/Extender:[name], [name] [name], [name] in Treatment: 138 Active Problems Location of Pain Severity and Description of Pain Patient Has Paino No Site Locations Rate the pain. Current Pain Level: 0 Pain Management and Medication Current Pain Management: Electronic Signature(s) Signed: 12/09/2018 5:16:36 PM By: Baruch Gouty RN, BSN Entered By: Baruch Gouty on 12/09/2018 10:46:13 -------------------------------------------------------------------------------- Patient/Caregiver Education Details Patient Name: Date of Service: [name] [name] 8/18/2020andnbsp10:30 AM [name] [phone] Patient Account Number: [id number] Date of Birth/Gender: 07/29/1945 (73 y.o. M) Treating RN: [name] Primary Care Physician: [name], [name] Other Clinician: Referring Physician: Treating Physician/Extender:[name], [name] [name], [name] in Treatment: [phone] Education Assessment Education Provided To: Patient Education Topics Provided Wound/Skin Impairment: Methods: Explain/Verbal Responses: State content correctly Electronic Signature(s) Signed: 03/31/2019 3:07:45 PM By: [name] RN Entered By: [name] on 12/09/2018 10:24:06 -------------------------------------------------------------------------------- Wound Assessment Details Patient Name: Date of Service: [name] [name], [name] [name] 12/09/2018 10:30 AM [name] [id number] Patient Account Number: [id number] Date of Birth/Sex: Treating RN: Sep 29, 1945 (73 y.o. [name] [name] Primary Care Fiana Gladu: [name], [name] Other Clinician: Referring Darryle Dennie: Treating Arnol Mcgibbon/Extender:[name], [name] [name], [name] in Treatment: 138 Wound Status Wound Number: 14 Primary Diabetic Wound/Ulcer of the Lower Extremity Etiology: Wound Location: Left Lower Leg - Lateral Wound Open Wounding Event: Gradually  Appeared Status: Date Acquired: 08/15/2017 Comorbid Anemia, Sleep Apnea, Arrhythmia, Congestive Weeks Of Treatment: 68 History: Heart Failure, Coronary Artery Disease, Clustered Wound: Yes Hypertension, Myocardial Infarction, Type II Diabetes, Gout, Neuropathy Photos Wound Measurements Length: (cm) 7.4 % Reduction in Width: (  Yes Tendon: Yes Fascia: No Muscle: No Joint: No Bone: No] [N/A:N/A] Epithelialization: [14:Medium (34-66%)] [18:Small (1-33%)] [N/A:N/A] Debridement: [14:Debridement - Excisional] [18:Debridement - Excisional] [N/A:N/A] Pre-procedure [14:11:34] [18:11:34] [N/A:N/A] Verification/Time Out Taken: Pain Control: [14:Lidocaine 5% topical ointment] [18:Lidocaine 5% topical ointment] [N/A:N/A] Tissue Debrided: [14:Subcutaneous, Slough] [18:Subcutaneous, Slough] [N/A:N/A] Level: [14:Skin/Subcutaneous Tissue] [18:Skin/Subcutaneous Tissue] [N/A:N/A] Debridement Area (sq cm):7.4 [18:3] [N/A:N/A] Instrument: [14:Curette] [18:Curette] [N/A:N/A] Bleeding: [14:Minimum] [18:Minimum] [N/A:N/A] Hemostasis Achieved: [14:Pressure] [18:Pressure] [N/A:N/A] Procedural Pain: [14:0] [18:0] [N/A:N/A] Post Procedural Pain: [14:0] [18:0] [N/A:N/A] Debridement Treatment Procedure was tolerated [18:Procedure was tolerated] [N/A:N/A] Response: [14:well] [18:well] Post Debridement [14:7.4x1x0.1]  [18:2x1.5x0.1] [N/A:N/A] Measurements L x W x D (cm) Post Debridement [14:0.581] [18:0.236] [N/A:N/A] Volume: (cm) Procedures Performed: Debridement [18:Debridement] [N/A:N/A] Treatment Notes Electronic Signature(s) Signed: 12/09/2018 5:30:55 PM By: Linton Ham MD Signed: 03/31/2019 3:07:45 PM By: [name] RN Entered By: Linton Ham on 12/09/2018 12:09:04 -------------------------------------------------------------------------------- Multi-Disciplinary Care Plan Details Patient Name: Date of Service: [name] [name]. 12/09/2018 10:30 AM [name] [id number] Patient Account Number: [id number] Date of Birth/Sex: Treating RN: [birth year] (73 y.o. [name] [name] Primary Care Nicholous Girgenti: [name], [name] Other Clinician: Referring Tremar Wickens: Treating Elizjah Noblet/Extender:[name], [name] [name], [name] in Treatment: 138 Active Inactive Wound/Skin Impairment Nursing Diagnoses: Impaired tissue integrity Goals: Patient/caregiver will verbalize understanding of skin care regimen Date Initiated: 11/19/2017 Target Resolution Date: 01/02/2019 Goal Status: Active Ulcer/skin breakdown will have a volume reduction of 30% by week 4 Date Initiated: 11/19/2017 Date Inactivated: 12/02/2017 Target Resolution Date: 12/22/2017 Unmet Reason: multipl Goal Status: Unmet comorbidities Interventions: Assess patient/caregiver ability to obtain necessary supplies Assess ulceration(s) every visit Provide education on ulcer and skin care Notes: Electronic Signature(s) Signed: 03/31/2019 3:07:45 PM By: [name] RN Entered By: [name] on 12/09/2018 10:23:49 -------------------------------------------------------------------------------- Pain Assessment Details Patient Name: Date of Service: [name] [name], [name] [name] 12/09/2018 10:30 AM [name] [id number] Patient Account Number: [id number] Date of Birth/Sex: Treating RN: 1945-12-26 (73 y.o. [name] [name] Primary Care  Regana Kemple: [name], [name] Other Clinician: Referring Anzlee Hinesley: Treating Armandina Iman/Extender:[name], [name] [name], [name] in Treatment: 138 Active Problems Location of Pain Severity and Description of Pain Patient Has Paino No Site Locations Rate the pain. Current Pain Level: 0 Pain Management and Medication Current Pain Management: Electronic Signature(s) Signed: 12/09/2018 5:16:36 PM By: Baruch Gouty RN, BSN Entered By: Baruch Gouty on 12/09/2018 10:46:13 -------------------------------------------------------------------------------- Patient/Caregiver Education Details Patient Name: Date of Service: [name] [name] 8/18/2020andnbsp10:30 AM [name] [phone] Patient Account Number: [id number] Date of Birth/Gender: 07/29/1945 (73 y.o. M) Treating RN: [name] Primary Care Physician: [name], [name] Other Clinician: Referring Physician: Treating Physician/Extender:[name], [name] [name], [name] in Treatment: [phone] Education Assessment Education Provided To: Patient Education Topics Provided Wound/Skin Impairment: Methods: Explain/Verbal Responses: State content correctly Electronic Signature(s) Signed: 03/31/2019 3:07:45 PM By: [name] RN Entered By: [name] on 12/09/2018 10:24:06 -------------------------------------------------------------------------------- Wound Assessment Details Patient Name: Date of Service: [name] [name], [name] [name] 12/09/2018 10:30 AM [name] [id number] Patient Account Number: [id number] Date of Birth/Sex: Treating RN: Sep 29, 1945 (73 y.o. [name] [name] Primary Care Fiana Gladu: [name], [name] Other Clinician: Referring Darryle Dennie: Treating Arnol Mcgibbon/Extender:[name], [name] [name], [name] in Treatment: 138 Wound Status Wound Number: 14 Primary Diabetic Wound/Ulcer of the Lower Extremity Etiology: Wound Location: Left Lower Leg - Lateral Wound Open Wounding Event: Gradually  Appeared Status: Date Acquired: 08/15/2017 Comorbid Anemia, Sleep Apnea, Arrhythmia, Congestive Weeks Of Treatment: 68 History: Heart Failure, Coronary Artery Disease, Clustered Wound: Yes Hypertension, Myocardial Infarction, Type II Diabetes, Gout, Neuropathy Photos Wound Measurements Length: (cm) 7.4 % Reduction in Width: (
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
## 2019-03-31 NOTE — Progress Notes (Signed)
RN: 1945-12-22 (73 y.o. [name] [name] Primary Care Tyri Elmore: [name], [name] Other Clinician: Referring Dahir Ayer: Treating Daivd Fredericksen/Extender:[name], [name] [name], [name] in Treatment: 151 Active Problems Location of Pain Severity and Description of Pain Patient Has Paino No Site Locations Pain Management and Medication Current Pain Management: Electronic Signature(s) Signed: 03/12/2019 6:05:31 PM By: Kela Millin Entered By: Kela Millin on 03/10/2019 10:52:28 -------------------------------------------------------------------------------- Patient/Caregiver Education Details Patient Name: [name] Record 11/17/2020andnbsp10:00 Date of Service: AM Medical Record [id number] Number: Patient Account Number: [id number] Treating RN: Date of Birth/Gender: 1945-11-25 (73 y.o. [name] [name]) Other Clinician: Primary Care Physician: [name], [name] Treating Linton Ham Referring Physician: Physician/Extender: [name], [name] Weeks in Treatment: 151 Education Assessment Education Provided To: Patient Education Topics Provided Wound/Skin Impairment: Methods: Explain/Verbal Responses: State content correctly Electronic Signature(s) Signed: 03/31/2019 2:50:47 PM By: [name] RN Entered By: [name] on 03/10/2019 10:02:17 -------------------------------------------------------------------------------- Wound Assessment Details Patient Name: Date of Service: [name] [name], [name] [name] 03/10/2019 10:00 AM Medical Record [id number] Patient Account Number: [id number] Date of Birth/Sex: Treating RN: March 30, 1946 (73 y.o. [name] [name] Primary Care Heath Badon: [name], [name] Other Clinician: Referring Leyli Kevorkian: Treating Liv Rallis/Extender:[name], [name] [name],  [name] in Treatment: 151 Wound Status Wound Number: 14 Primary Diabetic Wound/Ulcer of the Lower Extremity Etiology: Wound Location: Left Lower Leg - Lateral Wound Open Wounding Event: Gradually Appeared Status: Date Acquired: 08/15/2017 Comorbid Anemia, Sleep Apnea, Arrhythmia, Congestive Weeks Of Treatment: 81 History: Heart Failure, Coronary Artery Disease, Clustered Wound: Yes Hypertension, Myocardial Infarction, Type II Diabetes, Gout, Neuropathy Photos Wound Measurements Length: (cm) 8.8 % Reductio Width: (cm) 1.2 % Reductio Depth: (cm) 0.3 Epithelial Clustered Quantity: 1 Tunneling: Area: (cm) 8.294 Undermini Volume: (cm) 2.488 Wound Description Classification: Grade 2 Foul Odor Wound Margin: Well defined, not attached Slough/Fi Exudate Amount: Medium Exudate Type: Serosanguineous Exudate Color: red, brown Wound Bed Granulation Amount: Medium (34-66%) Granulation Quality: Red, Pink, Hyper-granulation Fascia Exp Necrotic Amount: Medium (34-66%) Fat Layer Necrotic Quality: Adherent Slough Tendon Exp Muscle Exp Joint Expo Bone Expos After Cleansing: No brino Yes Exposed Structure osed: No (Subcutaneous Tissue) Exposed: Yes osed: No osed: No sed: No ed: No n in Area: -780.5% n in Volume: -2546.8% ization: None No ng: No Electronic Signature(s) Signed: 03/11/2019 4:21:10 PM By: Mikeal Hawthorne EMT/HBOT Signed: 03/12/2019 6:05:31 PM By: Kela Millin Entered By: Mikeal Hawthorne on 03/11/2019 10:45:14 -------------------------------------------------------------------------------- Wound Assessment Details Patient Name: Date of Service: [name] Record. 03/10/2019 10:00 AM Medical Record [id number] Patient Account Number: [id number] Date of Birth/Sex: Treating RN: 1945-05-27 (73 y.o. [name] [name] Primary Care Terell Kincy: [name], [name] Other Clinician: Referring Akil Hoos: Treating Emry Tobin/Extender:[name], [name] [name],  [name] in Treatment: 151 Wound Status Wound Number: 18 Primary Diabetic Wound/Ulcer of the Lower Extremity Etiology: Wound Location: Left Foot - Dorsal Wound Open Wounding Event: Gradually Appeared Status: Date Acquired: 08/26/2018 Comorbid Anemia, Sleep Apnea, Arrhythmia, Congestive Weeks Of Treatment: 28 History: Heart Failure, Coronary Artery Disease, Clustered Wound: No Hypertension, Myocardial Infarction, Type II Diabetes, Gout, Neuropathy Photos Wound Measurements Length: (cm) 3.5 Width: (cm) 1.8 Depth: (cm) 0.1 Area: (cm) 4.948 Volume: (cm) 0.495 Wound Description Classification: Grade 2 Wound Margin: Well defined, not attached Exudate Amount: Medium Exudate Type: Serosanguineous Exudate Color: red, brown Wound Bed Granulation Amount: Small (1-33%) Granulation Quality: Pink Necrotic Amount: Large (67-100%) Necrotic Quality: Eschar, Adherent Slough After Cleansing: No brino Yes Exposed Structure osed: No (Subcutaneous Tissue) Exposed: Yes osed: Yes osed: Yes crosis of Muscle: Yes xposed: No posed: No % Reduction in Area: -1475.8% % Reduction in Volume: -1496.8%  RN: 1945-12-22 (73 y.o. [name] [name] Primary Care Tyri Elmore: [name], [name] Other Clinician: Referring Dahir Ayer: Treating Daivd Fredericksen/Extender:[name], [name] [name], [name] in Treatment: 151 Active Problems Location of Pain Severity and Description of Pain Patient Has Paino No Site Locations Pain Management and Medication Current Pain Management: Electronic Signature(s) Signed: 03/12/2019 6:05:31 PM By: Kela Millin Entered By: Kela Millin on 03/10/2019 10:52:28 -------------------------------------------------------------------------------- Patient/Caregiver Education Details Patient Name: [name] Record 11/17/2020andnbsp10:00 Date of Service: AM Medical Record [id number] Number: Patient Account Number: [id number] Treating RN: Date of Birth/Gender: 1945-11-25 (73 y.o. [name] [name]) Other Clinician: Primary Care Physician: [name], [name] Treating Linton Ham Referring Physician: Physician/Extender: [name], [name] Weeks in Treatment: 151 Education Assessment Education Provided To: Patient Education Topics Provided Wound/Skin Impairment: Methods: Explain/Verbal Responses: State content correctly Electronic Signature(s) Signed: 03/31/2019 2:50:47 PM By: [name] RN Entered By: [name] on 03/10/2019 10:02:17 -------------------------------------------------------------------------------- Wound Assessment Details Patient Name: Date of Service: [name] [name], [name] [name] 03/10/2019 10:00 AM Medical Record [id number] Patient Account Number: [id number] Date of Birth/Sex: Treating RN: March 30, 1946 (73 y.o. [name] [name] Primary Care Heath Badon: [name], [name] Other Clinician: Referring Leyli Kevorkian: Treating Liv Rallis/Extender:[name], [name] [name],  [name] in Treatment: 151 Wound Status Wound Number: 14 Primary Diabetic Wound/Ulcer of the Lower Extremity Etiology: Wound Location: Left Lower Leg - Lateral Wound Open Wounding Event: Gradually Appeared Status: Date Acquired: 08/15/2017 Comorbid Anemia, Sleep Apnea, Arrhythmia, Congestive Weeks Of Treatment: 81 History: Heart Failure, Coronary Artery Disease, Clustered Wound: Yes Hypertension, Myocardial Infarction, Type II Diabetes, Gout, Neuropathy Photos Wound Measurements Length: (cm) 8.8 % Reductio Width: (cm) 1.2 % Reductio Depth: (cm) 0.3 Epithelial Clustered Quantity: 1 Tunneling: Area: (cm) 8.294 Undermini Volume: (cm) 2.488 Wound Description Classification: Grade 2 Foul Odor Wound Margin: Well defined, not attached Slough/Fi Exudate Amount: Medium Exudate Type: Serosanguineous Exudate Color: red, brown Wound Bed Granulation Amount: Medium (34-66%) Granulation Quality: Red, Pink, Hyper-granulation Fascia Exp Necrotic Amount: Medium (34-66%) Fat Layer Necrotic Quality: Adherent Slough Tendon Exp Muscle Exp Joint Expo Bone Expos After Cleansing: No brino Yes Exposed Structure osed: No (Subcutaneous Tissue) Exposed: Yes osed: No osed: No sed: No ed: No n in Area: -780.5% n in Volume: -2546.8% ization: None No ng: No Electronic Signature(s) Signed: 03/11/2019 4:21:10 PM By: Mikeal Hawthorne EMT/HBOT Signed: 03/12/2019 6:05:31 PM By: Kela Millin Entered By: Mikeal Hawthorne on 03/11/2019 10:45:14 -------------------------------------------------------------------------------- Wound Assessment Details Patient Name: Date of Service: [name] Record. 03/10/2019 10:00 AM Medical Record [id number] Patient Account Number: [id number] Date of Birth/Sex: Treating RN: 1945-05-27 (73 y.o. [name] [name] Primary Care Terell Kincy: [name], [name] Other Clinician: Referring Akil Hoos: Treating Emry Tobin/Extender:[name], [name] [name],  [name] in Treatment: 151 Wound Status Wound Number: 18 Primary Diabetic Wound/Ulcer of the Lower Extremity Etiology: Wound Location: Left Foot - Dorsal Wound Open Wounding Event: Gradually Appeared Status: Date Acquired: 08/26/2018 Comorbid Anemia, Sleep Apnea, Arrhythmia, Congestive Weeks Of Treatment: 28 History: Heart Failure, Coronary Artery Disease, Clustered Wound: No Hypertension, Myocardial Infarction, Type II Diabetes, Gout, Neuropathy Photos Wound Measurements Length: (cm) 3.5 Width: (cm) 1.8 Depth: (cm) 0.1 Area: (cm) 4.948 Volume: (cm) 0.495 Wound Description Classification: Grade 2 Wound Margin: Well defined, not attached Exudate Amount: Medium Exudate Type: Serosanguineous Exudate Color: red, brown Wound Bed Granulation Amount: Small (1-33%) Granulation Quality: Pink Necrotic Amount: Large (67-100%) Necrotic Quality: Eschar, Adherent Slough After Cleansing: No brino Yes Exposed Structure osed: No (Subcutaneous Tissue) Exposed: Yes osed: Yes osed: Yes crosis of Muscle: Yes xposed: No posed: No % Reduction in Area: -1475.8% % Reduction in Volume: -1496.8%  Epithelialization: None Tunneling: No Undermining: No Foul Odor Slough/Fi Fascia Exp Fat Layer Tendon Exp Muscle Exp Ne Joint E Bone Ex Electronic Signature(s) Signed: 03/11/2019 4:21:10 PM By: Mikeal Hawthorne EMT/HBOT Signed: 03/12/2019 6:05:31 PM By: Kela Millin Entered By: Mikeal Hawthorne on 03/11/2019 10:45:45 -------------------------------------------------------------------------------- Wound Assessment Details Patient Name: Date of Service: [name] Record. 03/10/2019 10:00 AM Medical Record [id number] Patient Account Number: [id number] Date of Birth/Sex: Treating RN: 1945/11/03 (73 y.o. [name] [name] Primary Care Deangelo Berns: [name], [name] Other Clinician: Referring Keigan Tafoya: Treating Phoebe Marter/Extender:[name], [name] [name], [name] in Treatment:  151 Wound Status Wound Number: 20 Primary Inflammatory Etiology: Wound Location: Left Toe Great Wound Healed - Epithelialized Wounding Event: Trauma Status: Date Acquired: 01/04/2019 Comorbid Anemia, Sleep Apnea, Arrhythmia, Congestive Weeks Of Treatment: 9 History: Heart Failure, Coronary Artery Disease, Clustered Wound: No Hypertension, Myocardial Infarction, Type II Diabetes, Gout, Neuropathy Photos Wound Measurements Length: (cm) 0 % Reduc Width: (cm) 0 % Reduc Depth: (cm) 0 Epithel Area: (cm) 0 Tunnel Volume: (cm) 0 Underm Wound Description Classification: Full Thickness Without Exposed Support Foul Od Structures Slough/ Wound Distinct, outline attached Margin: Exudate None Present None Present Amount: Wound Bed Granulation Amount: None Present (0%) Necrotic Amount: None Present (0%) Fascia Ex Fat Layer Tendon Ex Muscle Ex Joint Exp Bone Expo or After Cleansing: No Fibrino No Exposed Structure posed: No (Subcutaneous Tissue) Exposed: No posed: No posed: No osed: No sed: No tion in Area: 100% tion in Volume: 100% ialization: Large (67-100%) ing: No ining: No Electronic Signature(s) Signed: 03/11/2019 4:21:10 PM By: Mikeal Hawthorne EMT/HBOT Signed: 03/12/2019 6:05:31 PM By: Kela Millin Entered By: Mikeal Hawthorne on 03/11/2019 09:56:43 -------------------------------------------------------------------------------- Wound Assessment Details Patient Name: Date of Service: [name] Record. 03/10/2019 10:00 AM Medical Record [id number] Patient Account Number: [id number] Date of Birth/Sex: Treating RN: [birth year] (73 y.o. [name] [name] Primary Care Hikaru [name]: [name], [name] Other Clinician: Referring John Vasconcelos: Treating Kursten Kruk/Extender:[name], [name] [name], [name] in Treatment: 151 Wound Status Wound Number: 22 Primary Pressure Ulcer Etiology: Wound Location: Left Toe Great - Distal Wound Open Wounding Event: Pressure  Injury Status: Date Acquired: 02/03/2019 Comorbid Anemia, Sleep Apnea, Arrhythmia, Congestive Weeks Of Treatment: 3 History: Heart Failure, Coronary Artery Disease, Clustered Wound: No Hypertension, Myocardial Infarction, Type II Diabetes, Gout, Neuropathy Photos Wound Measurements Length: (cm) 0.5 Width: (cm) 1 Depth: (cm) 0.1 Area: (cm) 0.393 Volume: (cm) 0.039 Wound Description Classification: Category/Stage II Wound Margin: Distinct, outline attached Exudate Amount: Small Exudate Type: Serous Exudate Color: amber Wound Bed Granulation Amount: Small (1-33%) Granulation Quality: Pink Necrotic Amount: Large (67-100%) Necrotic Quality: Eschar, Adherent Slough Foul Odor After Cleansing: No Slough/Fibrino Yes Exposed Structure Fascia Exposed: N Fat Layer (Subcutaneous Tissue) Exposed: Y Tendon Exposed: N Muscle Exposed: N Joint Exposed: N Bone Exposed: N % Reduction in Area: 28.5% % Reduction in Volume: 29.1% Epithelialization: None Tunneling: No Undermining: 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 03/10/2019 10:00 AM Medical Record [id number] Patient Account Number: [id number] Date of Birth/Sex: Treating RN: 01/08/1946 (73 y.o. [name] [name] Primary Care Koury Roddy: [name], [name] Other Clinician: Referring Giordan Fordham: Treating Gemini Beaumier/Extender:[name], [name] [name], [name] in Treatment: 151 Vital Signs Time Taken: 10:50 Temperature (F): 98.5 Height (in): 74 Pulse (bpm): 76 Weight (lbs): 150 Respiratory Rate (breaths/min): 18 Body Mass Index (BMI): 19.3 Blood Pressure (mmHg): 121/68 Capillary Blood  [name] [name], [name] [name] ([id number]) Visit Report for 03/10/2019 Arrival Information Details Patient Name: Date of Service: [name] [name], [name] [name] 03/10/2019 10:00 AM Medical Record [id number] Patient Account Number: [id number] Date of Birth/Sex: Treating RN: May 07, 1945 (73 y.o. [name] [name] Primary Care Daly Whipkey: [name], [name] Other Clinician: Referring Anders Hohmann: Treating Brieanna Nau/Extender:[name], [name] [name], [name] in Treatment: 151 Visit Information History Since Last Visit Added or deleted any medications: No Patient Arrived: Wheel Chair Any new allergies or adverse reactions: No Arrival Time: 10:46 Had a fall or experienced change in No Accompanied By: daughter [employer] Patient activities of daily living that may affect Transfer Assistance: risk of falls: Lift Signs or symptoms of abuse/neglect since last No Patient Identification Verified: Yes visito Secondary Verification Process Yes Hospitalized since last visit: No Completed: Implantable device outside of the clinic excluding No Patient Requires Transmission- No cellular tissue based products placed in the center Based Precautions: since last visit: Patient Has Alerts: Yes Patient on Blood Has Dressing in Place as Prescribed: Yes Patient Alerts: Has Compression in Place as Prescribed: Yes Thinner left ABI 1.0; rt ABI Pain Present Now: No 1.09 Electronic Signature(s) Signed: 03/12/2019 6:05:31 PM By: Kela Millin Entered By: Kela Millin on 03/10/2019 10:47:12 -------------------------------------------------------------------------------- Encounter Discharge Information Details Patient Name: Date of Service: [name] Garnet F. 03/10/2019 10:00 AM Medical Record [id number] Patient Account Number: [id number] Date of Birth/Sex: Treating RN: [birth year] (72 y.o. [name] [name] Primary Care Rett Stehlik: [name], [name] Other Clinician: Referring Tramaine Snell: Treating  Kinsley Nicklaus/Extender:[name], [name] [name], [name] in Treatment: 151 Encounter Discharge Information Items Post Procedure Vitals Discharge Condition: Stable Temperature (F): 98.5 Ambulatory Status: Wheelchair Pulse (bpm): 76 Discharge Destination: Home Respiratory Rate (breaths/min): 18 Transportation: Private Auto Blood Pressure (mmHg): 121/68 Accompanied By: daughter Schedule Follow-up Appointment: Yes Clinical Summary of Care: Patient Declined Electronic Signature(s) Signed: 03/16/2019 2:18:43 PM By: Levan Hurst RN, BSN Entered By: Levan Hurst on 03/10/2019 17:30:00 -------------------------------------------------------------------------------- Lower Extremity Assessment Details Patient Name: Date of Service: [name] Record. 03/10/2019 10:00 AM Medical Record [id number] Patient Account Number: [id number] Date of Birth/Sex: Treating RN: 1946/03/18 (73 y.o. [name] [name] Primary Care Hollin Crewe: [name], [name] Other Clinician: Referring Mignonne Afonso: Treating Rivaan Kendall/Extender:[name], [name] [name], [name] in Treatment: 151 Edema Assessment Assessed: [Left: No] [Right: No] Edema: [Left: Ye] [Right: s] Calf Left: Right: Point of Measurement: 36 cm From Medial Instep 28.2 cm cm Ankle Left: Right: Point of Measurement: 11 cm From Medial Instep 18 cm cm Vascular Assessment Pulses: Dorsalis Pedis Palpable: [Left:Yes] Electronic Signature(s) Signed: 03/12/2019 6:05:31 PM By: Kela Millin Entered By: Kela Millin on 03/10/2019 10:53:01 -------------------------------------------------------------------------------- Multi Wound Chart Details Patient Name: Date of Service: [name] Record. 03/10/2019 10:00 AM Medical Record [id number] Patient Account Number: [id number] Date of Birth/Sex: Treating RN: 04-Jun-1945 (73 y.o. [name]) [name] Primary Care Luvern Mcisaac: Other Clinician: [name], [name] Referring Raeann Offner: Treating  Coni Homesley/Extender:[name], [name] [name], [name] in Treatment: 151 Vital Signs Height(in): 74 Capillary Blood 115 Glucose(mg/dl): Weight(lbs): 150 Pulse(bpm): 56 Body Mass Index(BMI): 19 Blood Pressure(mmHg): 121/68 Temperature(F): 98.5 Respiratory 18 Rate(breaths/min): Photos: [14:No Photos] [18:No Photos] [20:No Photos] Wound Location: [14:Left Lower Leg - Lateral] [18:Left Foot - Dorsal] [20:Left Toe Great] Wounding Event: [14:Gradually Appeared] [18:Gradually Appeared] [20:Trauma] Primary Etiology: [14:Diabetic Wound/Ulcer of the Diabetic Wound/Ulcer of the Inflammatory Lower Extremity] [18:Lower Extremity] Comorbid History: [14:Anemia, Sleep Apnea, Arrhythmia, Congestive Heart Failure, Coronary Artery Disease, Hypertension, Myocardial Hypertension, Myocardial Hypertension, Myocardial Infarction, Type II Diabetes, Infarction, Type II Diabetes, Infarction,  Type II Diabetes, Gout, Neuropathy] [18:Anemia, Sleep Apnea, Arrhythmia, Congestive Heart Failure,  Epithelialization: None Tunneling: No Undermining: No Foul Odor Slough/Fi Fascia Exp Fat Layer Tendon Exp Muscle Exp Ne Joint E Bone Ex Electronic Signature(s) Signed: 03/11/2019 4:21:10 PM By: Mikeal Hawthorne EMT/HBOT Signed: 03/12/2019 6:05:31 PM By: Kela Millin Entered By: Mikeal Hawthorne on 03/11/2019 10:45:45 -------------------------------------------------------------------------------- Wound Assessment Details Patient Name: Date of Service: [name] Record. 03/10/2019 10:00 AM Medical Record [id number] Patient Account Number: [id number] Date of Birth/Sex: Treating RN: 1945/11/03 (73 y.o. [name] [name] Primary Care Deangelo Berns: [name], [name] Other Clinician: Referring Keigan Tafoya: Treating Phoebe Marter/Extender:[name], [name] [name], [name] in Treatment:  151 Wound Status Wound Number: 20 Primary Inflammatory Etiology: Wound Location: Left Toe Great Wound Healed - Epithelialized Wounding Event: Trauma Status: Date Acquired: 01/04/2019 Comorbid Anemia, Sleep Apnea, Arrhythmia, Congestive Weeks Of Treatment: 9 History: Heart Failure, Coronary Artery Disease, Clustered Wound: No Hypertension, Myocardial Infarction, Type II Diabetes, Gout, Neuropathy Photos Wound Measurements Length: (cm) 0 % Reduc Width: (cm) 0 % Reduc Depth: (cm) 0 Epithel Area: (cm) 0 Tunnel Volume: (cm) 0 Underm Wound Description Classification: Full Thickness Without Exposed Support Foul Od Structures Slough/ Wound Distinct, outline attached Margin: Exudate None Present None Present Amount: Wound Bed Granulation Amount: None Present (0%) Necrotic Amount: None Present (0%) Fascia Ex Fat Layer Tendon Ex Muscle Ex Joint Exp Bone Expo or After Cleansing: No Fibrino No Exposed Structure posed: No (Subcutaneous Tissue) Exposed: No posed: No posed: No osed: No sed: No tion in Area: 100% tion in Volume: 100% ialization: Large (67-100%) ing: No ining: No Electronic Signature(s) Signed: 03/11/2019 4:21:10 PM By: Mikeal Hawthorne EMT/HBOT Signed: 03/12/2019 6:05:31 PM By: Kela Millin Entered By: Mikeal Hawthorne on 03/11/2019 09:56:43 -------------------------------------------------------------------------------- Wound Assessment Details Patient Name: Date of Service: [name] Record. 03/10/2019 10:00 AM Medical Record [id number] Patient Account Number: [id number] Date of Birth/Sex: Treating RN: [birth year] (73 y.o. [name] [name] Primary Care Hikaru [name]: [name], [name] Other Clinician: Referring John Vasconcelos: Treating Kursten Kruk/Extender:[name], [name] [name], [name] in Treatment: 151 Wound Status Wound Number: 22 Primary Pressure Ulcer Etiology: Wound Location: Left Toe Great - Distal Wound Open Wounding Event: Pressure  Injury Status: Date Acquired: 02/03/2019 Comorbid Anemia, Sleep Apnea, Arrhythmia, Congestive Weeks Of Treatment: 3 History: Heart Failure, Coronary Artery Disease, Clustered Wound: No Hypertension, Myocardial Infarction, Type II Diabetes, Gout, Neuropathy Photos Wound Measurements Length: (cm) 0.5 Width: (cm) 1 Depth: (cm) 0.1 Area: (cm) 0.393 Volume: (cm) 0.039 Wound Description Classification: Category/Stage II Wound Margin: Distinct, outline attached Exudate Amount: Small Exudate Type: Serous Exudate Color: amber Wound Bed Granulation Amount: Small (1-33%) Granulation Quality: Pink Necrotic Amount: Large (67-100%) Necrotic Quality: Eschar, Adherent Slough Foul Odor After Cleansing: No Slough/Fibrino Yes Exposed Structure Fascia Exposed: N Fat Layer (Subcutaneous Tissue) Exposed: Y Tendon Exposed: N Muscle Exposed: N Joint Exposed: N Bone Exposed: N % Reduction in Area: 28.5% % Reduction in Volume: 29.1% Epithelialization: None Tunneling: No Undermining: 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 03/10/2019 10:00 AM Medical Record [id number] Patient Account Number: [id number] Date of Birth/Sex: Treating RN: 01/08/1946 (73 y.o. [name] [name] Primary Care Koury Roddy: [name], [name] Other Clinician: Referring Giordan Fordham: Treating Gemini Beaumier/Extender:[name], [name] [name], [name] in Treatment: 151 Vital Signs Time Taken: 10:50 Temperature (F): 98.5 Height (in): 74 Pulse (bpm): 76 Weight (lbs): 150 Respiratory Rate (breaths/min): 18 Body Mass Index (BMI): 19.3 Blood Pressure (mmHg): 121/68 Capillary Blood

## 2019-04-01 NOTE — Progress Notes (Signed)
[name] [name], [name] [name] ([id number]) Visit Report for 03/31/2019 Arrival Information Details Patient Name: Date of Service: [name] [name] 03/31/2019 10:00 AM [name] [id number] Patient Account Number: [id number] Date of Birth/Sex: Treating RN: 1945-09-07 (73 y.o. [name] Diener Primary Care Marquice Uddin: [name], [name] Other Clinician: Referring Mersadez Linden: Treating Kristel Durkee/Extender:[name], [name] [name], [name] in Treatment: 154 Visit Information History Since Last Visit Added or deleted any medications: No Patient Arrived: Wheel Chair Any new allergies or adverse reactions: No Arrival Time: 10:05 Had a fall or experienced change in No Accompanied By: self activities of daily living that may affect Transfer Assistance: None risk of falls: Patient Identification Verified: Yes Signs or symptoms of abuse/neglect since last No Secondary Verification Process Yes visito Completed: Hospitalized since last visit: No Patient Requires Transmission- No Implantable device outside of the clinic excluding No Based Precautions: cellular tissue based products placed in the center Patient Has Alerts: Yes Patient on Blood since last visit: Patient Alerts: Has Dressing in Place as Prescribed: Yes Thinner left ABI 1.0; rt ABI Pain Present Now: No 1.09 Electronic Signature(s) Signed: 03/31/2019 5:32:49 PM By: Deon Pilling Entered By: Deon Pilling on 03/31/2019 10:08:33 -------------------------------------------------------------------------------- Encounter Discharge Information Details Patient Name: Date of Service: [name] [name]. 03/31/2019 10:00 AM [name] [id number] Patient Account Number: [id number] Date of Birth/Sex: Treating RN: 12-30-1945 (73 y.o. Marvis [name] Primary Care Dakwon Wenberg: [name], [name] Other Clinician: Referring Dezerae Freiberger: Treating Shanen Norris/Extender:[name], [name] [name], [name] in Treatment: 154 Encounter Discharge  Information Items Post Procedure Vitals Discharge Condition: Stable Temperature (F): 97.7 Ambulatory Status: Wheelchair Pulse (bpm): 75 Discharge Destination: Home Respiratory Rate (breaths/min): 20 Transportation: Private Auto Blood Pressure (mmHg): 130/71 Accompanied By: daughter Schedule Follow-up Appointment: Yes Clinical Summary of Care: Patient Declined Electronic Signature(s) Signed: 04/01/2019 12:04:56 PM By: Kela Millin Entered By: Kela Millin on 03/31/2019 13:36:15 -------------------------------------------------------------------------------- Lower Extremity Assessment Details Patient Name: Date of Service: [name] [name] 03/31/2019 10:00 AM [name] [id number] Patient Account Number: [id number] Date of Birth/Sex: Treating RN: [birth year] (73 y.o. [name] Diener Primary Care Shalisha Clausing: [name], [name] Other Clinician: Referring Berda Shelvin: Treating [name], [name] [name], [name] in Treatment: 154 Edema Assessment Assessed: [Left: Yes] [Right: No] Edema: [Left: N] [Right: o] Calf Left: Right: Point of Measurement: 36 cm From Medial Instep 22 cm cm Ankle Left: Right: Point of Measurement: 11 cm From Medial Instep 18 cm cm Electronic Signature(s) Signed: 03/31/2019 5:32:49 PM By: Deon Pilling Entered By: Deon Pilling on 03/31/2019 10:16:56 -------------------------------------------------------------------------------- Multi Wound Chart Details Patient Name: Date of Service: [name] [name]. 03/31/2019 10:00 AM [name] [id number] Patient Account Number: [id number] Date of Birth/Sex: Treating RN: [birth year] (73 y.o. M) Primary Care Jullia Mulligan: [name], [name] Other Clinician: Referring Liyana Suniga: Treating [name], [name] [name], [name] in Treatment: 154 Vital Signs Height(in): 74 Capillary Blood 108 Glucose(mg/dl): Weight(lbs): 150 Pulse(bpm): 73 Body Mass Index(BMI): 19 Blood  Pressure(mmHg): 130/71 Temperature(F): 97.7 Respiratory 20 Rate(breaths/min): Photos: [14:No Photos] [18:No Photos] [22:No Photos] Wound Location: [14:Left Lower Leg - Lateral] [18:Left Foot - Dorsal] [22:Left Toe Great - Distal] Wounding Event: [14:Gradually Appeared] [18:Gradually Appeared] [22:Pressure Injury] Primary Etiology: [14: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  18 (Left, Dorsal Foot) 1. Cleanse With Wound Cleanser Soap and water 2. Periwound Care TCA Cream 3. Primary Dressing Applied Other primary dressing (specifiy in notes) 4. Secondary Dressing Dry Gauze 6. Support Layer Applied Kerlix/Coban Notes sorbact, cotton instead of kerlix and Event organiser) Signed: 04/01/2019 11:25:39 AM By: Mikeal Hawthorne EMT/HBOT Signed: 04/01/2019 12:05:21 PM By: Deon Pilling Previous Signature: 03/31/2019 5:32:49 PM Version By: Lady Deutscher Entered By: Mikeal Hawthorne on 12/ Cleansing: No Yes Exposed Structure No utaneous Tissue) Exposed: Yes Yes Yes f Muscle: Yes No No i 12/2018 10:15:51 rea: -1230.6% olume: -1248.4% n: Small (1-33%) No No -------------------------------------------------------------------------------- Wound Assessment Details Patient Name: Date of  Service: [name] [name] 03/31/2019 10:00 AM [name] [id number] Patient Account Number: [id number] Date of Birth/Sex: Treating RN: 12-03-1945 (73 y.o. [name] Diener Primary Care Lynnita Somma: [name], [name] Other Clinician: Referring Zaeem Kandel: Treating Loriel Diehl/Extender:[name], [name] [name], [name] in Treatment: 154 Wound Status Wound Number: 22 Primary Pressure Ulcer Etiology: Wound Location: Left Toe Great - Distal Wound Open Wounding Event: Pressure Injury Status: Date Acquired: 02/03/2019 Comorbid Anemia, Sleep Apnea, Arrhythmia, Congestive Weeks Of Treatment: 6 History: Heart Failure, Coronary Artery Disease, Clustered Wound: No Hypertension, Myocardial Infarction, Type II Diabetes, Gout, Neuropathy Photos Wound Measurements Length: (cm) 0.4 % Reduct Width: (cm) 0.9 % Reduct Depth: (cm) 0.1 Epitheli Area: (cm) 0.283 Tunneli Volume: (cm) 0.028 Undermi Wound Description Classification: Category/Stage II Wound Margin: Distinct, outline attached Exudate Amount: Medium Exudate Type: Serosanguineous Exudate Color: red, brown Wound Bed Granulation Amount: Medium (34-66%) Granulation Quality: Pink Necrotic Amount: Medium (34-66%) Necrotic Quality: Adherent Slough Foul Odor After Cleansing: No Slough/Fibrino Yes Exposed Structure Fascia Exposed: No Fat Layer (Subcutaneous Tissue) Exposed: Yes Tendon Exposed: No Muscle Exposed: No Joint Exposed: No Bone Exposed: No ion in Area: 48.5% ion in Volume: 49.1% alization: Small (1-33%) ng: No ning: No Treatment Notes Wound #22 (Left, Distal Toe Great) 1. Cleanse With Wound Cleanser Soap and water 2. Periwound Care TCA Cream 3. Primary Dressing Applied Other primary dressing (specifiy in notes) 4. Secondary Dressing Dry Gauze 6. Support Layer Applied Kerlix/Coban Notes sorbact, cotton instead of kerlix and Event organiser) Signed: 04/01/2019 11:25:39 AM By: Mikeal Hawthorne  EMT/HBOT Signed: 04/01/2019 12:05:21 PM By: Deon Pilling Previous Signature: 03/31/2019 5:32:49 PM Version By: Deon Pilling Entered By: Mikeal Hawthorne on 04/01/2019 10:15:24 -------------------------------------------------------------------------------- Vitals Details Patient Name: Date of Service: [name] [name]. 03/31/2019 10:00 AM [name] [id number] Patient Account Number: [id number] Date of Birth/Sex: Treating RN: [birth year] (73 y.o. [name] Diener Primary Care Zuhair Lariccia: [name], [name] Other Clinician: Referring Kadajah Kjos: Treating Jeralyn Nolden/Extender:[name], [name] [name], [name] in Treatment: 154 Vital Signs Time Taken: 10:08 Temperature (F): 97.7 Height (in): 74 Pulse (bpm): 75 Weight (lbs): 150 Respiratory Rate (breaths/min): 20 Body Mass Index (BMI): 19.3 Blood Pressure (mmHg): 130/71 Capillary Blood Glucose (mg/dl): 108 Reference Range: 80 - 120 mg / dl Electronic Signature(s) Signed: 03/31/2019 5:32:49 PM By: Deon Pilling Entered By: Deon Pilling on 03/31/2019 10:10:02  [name] [name], [name] [name] ([id number]) Visit Report for 03/31/2019 Arrival Information Details Patient Name: Date of Service: [name] [name] 03/31/2019 10:00 AM [name] [id number] Patient Account Number: [id number] Date of Birth/Sex: Treating RN: 1945-09-07 (73 y.o. [name] Diener Primary Care Marquice Uddin: [name], [name] Other Clinician: Referring Mersadez Linden: Treating Kristel Durkee/Extender:[name], [name] [name], [name] in Treatment: 154 Visit Information History Since Last Visit Added or deleted any medications: No Patient Arrived: Wheel Chair Any new allergies or adverse reactions: No Arrival Time: 10:05 Had a fall or experienced change in No Accompanied By: self activities of daily living that may affect Transfer Assistance: None risk of falls: Patient Identification Verified: Yes Signs or symptoms of abuse/neglect since last No Secondary Verification Process Yes visito Completed: Hospitalized since last visit: No Patient Requires Transmission- No Implantable device outside of the clinic excluding No Based Precautions: cellular tissue based products placed in the center Patient Has Alerts: Yes Patient on Blood since last visit: Patient Alerts: Has Dressing in Place as Prescribed: Yes Thinner left ABI 1.0; rt ABI Pain Present Now: No 1.09 Electronic Signature(s) Signed: 03/31/2019 5:32:49 PM By: Deon Pilling Entered By: Deon Pilling on 03/31/2019 10:08:33 -------------------------------------------------------------------------------- Encounter Discharge Information Details Patient Name: Date of Service: [name] [name]. 03/31/2019 10:00 AM [name] [id number] Patient Account Number: [id number] Date of Birth/Sex: Treating RN: 12-30-1945 (73 y.o. Marvis [name] Primary Care Dakwon Wenberg: [name], [name] Other Clinician: Referring Dezerae Freiberger: Treating Shanen Norris/Extender:[name], [name] [name], [name] in Treatment: 154 Encounter Discharge  Information Items Post Procedure Vitals Discharge Condition: Stable Temperature (F): 97.7 Ambulatory Status: Wheelchair Pulse (bpm): 75 Discharge Destination: Home Respiratory Rate (breaths/min): 20 Transportation: Private Auto Blood Pressure (mmHg): 130/71 Accompanied By: daughter Schedule Follow-up Appointment: Yes Clinical Summary of Care: Patient Declined Electronic Signature(s) Signed: 04/01/2019 12:04:56 PM By: Kela Millin Entered By: Kela Millin on 03/31/2019 13:36:15 -------------------------------------------------------------------------------- Lower Extremity Assessment Details Patient Name: Date of Service: [name] [name] 03/31/2019 10:00 AM [name] [id number] Patient Account Number: [id number] Date of Birth/Sex: Treating RN: [birth year] (73 y.o. [name] Diener Primary Care Shalisha Clausing: [name], [name] Other Clinician: Referring Berda Shelvin: Treating [name], [name] [name], [name] in Treatment: 154 Edema Assessment Assessed: [Left: Yes] [Right: No] Edema: [Left: N] [Right: o] Calf Left: Right: Point of Measurement: 36 cm From Medial Instep 22 cm cm Ankle Left: Right: Point of Measurement: 11 cm From Medial Instep 18 cm cm Electronic Signature(s) Signed: 03/31/2019 5:32:49 PM By: Deon Pilling Entered By: Deon Pilling on 03/31/2019 10:16:56 -------------------------------------------------------------------------------- Multi Wound Chart Details Patient Name: Date of Service: [name] [name]. 03/31/2019 10:00 AM [name] [id number] Patient Account Number: [id number] Date of Birth/Sex: Treating RN: [birth year] (73 y.o. M) Primary Care Jullia Mulligan: [name], [name] Other Clinician: Referring Liyana Suniga: Treating [name], [name] [name], [name] in Treatment: 154 Vital Signs Height(in): 74 Capillary Blood 108 Glucose(mg/dl): Weight(lbs): 150 Pulse(bpm): 73 Body Mass Index(BMI): 19 Blood  Pressure(mmHg): 130/71 Temperature(F): 97.7 Respiratory 20 Rate(breaths/min): Photos: [14:No Photos] [18:No Photos] [22:No Photos] Wound Location: [14:Left Lower Leg - Lateral] [18:Left Foot - Dorsal] [22:Left Toe Great - Distal] Wounding Event: [14:Gradually Appeared] [18:Gradually Appeared] [22:Pressure Injury] Primary Etiology: [14: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  [name] [name], [name] [name] ([id number]) Visit Report for 03/31/2019 Arrival Information Details Patient Name: Date of Service: [name] [name] 03/31/2019 10:00 AM [name] [id number] Patient Account Number: [id number] Date of Birth/Sex: Treating RN: 1945-09-07 (73 y.o. [name] Diener Primary Care Marquice Uddin: [name], [name] Other Clinician: Referring Mersadez Linden: Treating Kristel Durkee/Extender:[name], [name] [name], [name] in Treatment: 154 Visit Information History Since Last Visit Added or deleted any medications: No Patient Arrived: Wheel Chair Any new allergies or adverse reactions: No Arrival Time: 10:05 Had a fall or experienced change in No Accompanied By: self activities of daily living that may affect Transfer Assistance: None risk of falls: Patient Identification Verified: Yes Signs or symptoms of abuse/neglect since last No Secondary Verification Process Yes visito Completed: Hospitalized since last visit: No Patient Requires Transmission- No Implantable device outside of the clinic excluding No Based Precautions: cellular tissue based products placed in the center Patient Has Alerts: Yes Patient on Blood since last visit: Patient Alerts: Has Dressing in Place as Prescribed: Yes Thinner left ABI 1.0; rt ABI Pain Present Now: No 1.09 Electronic Signature(s) Signed: 03/31/2019 5:32:49 PM By: Deon Pilling Entered By: Deon Pilling on 03/31/2019 10:08:33 -------------------------------------------------------------------------------- Encounter Discharge Information Details Patient Name: Date of Service: [name] [name]. 03/31/2019 10:00 AM [name] [id number] Patient Account Number: [id number] Date of Birth/Sex: Treating RN: 12-30-1945 (73 y.o. Marvis [name] Primary Care Dakwon Wenberg: [name], [name] Other Clinician: Referring Dezerae Freiberger: Treating Shanen Norris/Extender:[name], [name] [name], [name] in Treatment: 154 Encounter Discharge  Information Items Post Procedure Vitals Discharge Condition: Stable Temperature (F): 97.7 Ambulatory Status: Wheelchair Pulse (bpm): 75 Discharge Destination: Home Respiratory Rate (breaths/min): 20 Transportation: Private Auto Blood Pressure (mmHg): 130/71 Accompanied By: daughter Schedule Follow-up Appointment: Yes Clinical Summary of Care: Patient Declined Electronic Signature(s) Signed: 04/01/2019 12:04:56 PM By: Kela Millin Entered By: Kela Millin on 03/31/2019 13:36:15 -------------------------------------------------------------------------------- Lower Extremity Assessment Details Patient Name: Date of Service: [name] [name] 03/31/2019 10:00 AM [name] [id number] Patient Account Number: [id number] Date of Birth/Sex: Treating RN: [birth year] (73 y.o. [name] Diener Primary Care Shalisha Clausing: [name], [name] Other Clinician: Referring Berda Shelvin: Treating [name], [name] [name], [name] in Treatment: 154 Edema Assessment Assessed: [Left: Yes] [Right: No] Edema: [Left: N] [Right: o] Calf Left: Right: Point of Measurement: 36 cm From Medial Instep 22 cm cm Ankle Left: Right: Point of Measurement: 11 cm From Medial Instep 18 cm cm Electronic Signature(s) Signed: 03/31/2019 5:32:49 PM By: Deon Pilling Entered By: Deon Pilling on 03/31/2019 10:16:56 -------------------------------------------------------------------------------- Multi Wound Chart Details Patient Name: Date of Service: [name] [name]. 03/31/2019 10:00 AM [name] [id number] Patient Account Number: [id number] Date of Birth/Sex: Treating RN: [birth year] (73 y.o. M) Primary Care Jullia Mulligan: [name], [name] Other Clinician: Referring Liyana Suniga: Treating [name], [name] [name], [name] in Treatment: 154 Vital Signs Height(in): 74 Capillary Blood 108 Glucose(mg/dl): Weight(lbs): 150 Pulse(bpm): 73 Body Mass Index(BMI): 19 Blood  Pressure(mmHg): 130/71 Temperature(F): 97.7 Respiratory 20 Rate(breaths/min): Photos: [14:No Photos] [18:No Photos] [22:No Photos] Wound Location: [14:Left Lower Leg - Lateral] [18:Left Foot - Dorsal] [22:Left Toe Great - Distal] Wounding Event: [14:Gradually Appeared] [18:Gradually Appeared] [22:Pressure Injury] Primary Etiology: [14: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

## 2019-04-07 DIAGNOSIS — N312 Flaccid neuropathic bladder, not elsewhere classified: Secondary | ICD-10-CM | POA: Diagnosis not present

## 2019-04-15 ENCOUNTER — Other Ambulatory Visit: Payer: Self-pay

## 2019-04-28 ENCOUNTER — Encounter (HOSPITAL_BASED_OUTPATIENT_CLINIC_OR_DEPARTMENT_OTHER): Payer: Medicare HMO | Attending: Internal Medicine | Admitting: Internal Medicine

## 2019-04-28 ENCOUNTER — Other Ambulatory Visit: Payer: Self-pay

## 2019-04-28 DIAGNOSIS — E1142 Type 2 diabetes mellitus with diabetic polyneuropathy: Secondary | ICD-10-CM | POA: Diagnosis not present

## 2019-04-28 DIAGNOSIS — L89613 Pressure ulcer of right heel, stage 3: Secondary | ICD-10-CM | POA: Diagnosis not present

## 2019-04-28 DIAGNOSIS — E11622 Type 2 diabetes mellitus with other skin ulcer: Secondary | ICD-10-CM | POA: Insufficient documentation

## 2019-04-28 DIAGNOSIS — Z95810 Presence of automatic (implantable) cardiac defibrillator: Secondary | ICD-10-CM | POA: Diagnosis not present

## 2019-04-28 DIAGNOSIS — N189 Chronic kidney disease, unspecified: Secondary | ICD-10-CM | POA: Insufficient documentation

## 2019-04-28 DIAGNOSIS — L97522 Non-pressure chronic ulcer of other part of left foot with fat layer exposed: Secondary | ICD-10-CM | POA: Diagnosis not present

## 2019-04-28 DIAGNOSIS — L97221 Non-pressure chronic ulcer of left calf limited to breakdown of skin: Secondary | ICD-10-CM | POA: Diagnosis not present

## 2019-04-28 DIAGNOSIS — E1122 Type 2 diabetes mellitus with diabetic chronic kidney disease: Secondary | ICD-10-CM | POA: Insufficient documentation

## 2019-04-28 DIAGNOSIS — I129 Hypertensive chronic kidney disease with stage 1 through stage 4 chronic kidney disease, or unspecified chronic kidney disease: Secondary | ICD-10-CM | POA: Insufficient documentation

## 2019-04-28 DIAGNOSIS — I5040 Unspecified combined systolic (congestive) and diastolic (congestive) heart failure: Secondary | ICD-10-CM | POA: Diagnosis not present

## 2019-04-28 DIAGNOSIS — G4730 Sleep apnea, unspecified: Secondary | ICD-10-CM | POA: Insufficient documentation

## 2019-04-28 DIAGNOSIS — L97521 Non-pressure chronic ulcer of other part of left foot limited to breakdown of skin: Secondary | ICD-10-CM | POA: Insufficient documentation

## 2019-04-28 DIAGNOSIS — L97321 Non-pressure chronic ulcer of left ankle limited to breakdown of skin: Secondary | ICD-10-CM | POA: Insufficient documentation

## 2019-04-28 DIAGNOSIS — E11621 Type 2 diabetes mellitus with foot ulcer: Secondary | ICD-10-CM | POA: Diagnosis not present

## 2019-04-28 NOTE — Progress Notes (Signed)
RN Signed: 04/28/2019 5:30:44 PM By: Linton Ham MD Entered By: [name] on 04/28/2019 11:21:29 -------------------------------------------------------------------------------- Problem List Details Patient Name: Date of Service: [name] 04/28/2019 10:00 AM Medical Record [id number] Patient Account Number: [id number] Date of Birth/Sex: Treating RN: June 24, 1945 (73 y.o. [name] Primary Care Provider: [name], [name] Other Clinician: Referring Provider: Treating Provider/Extender:Ruperto Kiernan [name], [name] in Treatment: 158 Active Problems ICD-10 Evaluated Encounter Code Description Active Date Today Diagnosis L97.221 Non-pressure chronic ulcer of left calf limited to 08/15/2017 No Yes breakdown of skin L97.321 Non-pressure chronic ulcer of left ankle limited to 07/01/2018 No Yes breakdown of skin L97.521 Non-pressure chronic ulcer of other part of left foot 02/17/2019 No Yes limited to breakdown of skin L89.613 Pressure ulcer of right heel, stage 3 04/17/2016 No Yes Inactive Problems ICD-10 Code Description Active Date Inactive Date E11.621 Type 2 diabetes mellitus with foot ulcer 04/17/2016 04/17/2016 L03.032 Cellulitis of left toe 01/06/2019 01/06/2019 I25.119 Atherosclerotic heart disease of native coronary artery with 04/17/2016 04/17/2016 unspecified angina pectoris S51.811D Laceration without foreign body of right forearm, subsequent 10/22/2017 10/22/2017 encounter [id number] Acute diastolic (congestive) heart failure 04/17/2016 04/17/2016 L03.116 Cellulitis of left lower limb 12/24/2017 12/24/2017 [id number] Pressure ulcer of left heel, stage 3 12/04/2016 12/04/2016 L97.821 Non-pressure chronic ulcer of other part of left lower leg limited 01/06/2019  01/06/2019 to breakdown of skin Resolved Problems ICD-10 Code Description Active Date Resolved Date L89.512 Pressure ulcer of right ankle, stage 2 04/17/2016 04/17/2016 L89.522 Pressure ulcer of left ankle, stage 2 04/17/2016 04/17/2016 Electronic Signature(s) Signed: 04/28/2019 5:30:44 PM By: Linton Ham MD Entered By: Linton Ham on 04/28/2019 12:12:35 -------------------------------------------------------------------------------- Progress Note Details Patient Name: Date of Service: [name] 04/28/2019 10:00 AM Medical Record [id number] Patient Account Number: [id number] Date of Birth/Sex: Treating RN: 14-Feb-1946 (74 y.o. M) Primary Care Provider: [name], [name] Other Clinician: Referring Provider: Treating Provider/Extender:Constance Whittle [name], [name] in Treatment: 158 Subjective History of Present Illness (HPI) The following HPI elements were documented for the patient's wound: Location: On the left and right lateral forefoot which has been there for about 6 months Quality: Patient reports No Pain. Severity: Patient states wound(s) are getting worse. Duration: Patient has had the wound for > 6 months prior to seeking treatment at the wound center Context: The wound would happen gradually Modifying Factors: Patient wound(s)/ulcer(s) are worsening due to :continual drainage from the wound Associated Signs and Symptoms: Patient reports having increase discharge. This patient returns after being seen here till the end of August and he was lost to follow-up. he has been quite debilitated laying in bed most of the time and his condition has deteriorated significantly. He has multiple ulcerations on the heel lateral forefoot and some of his toes. ======== Old notes: 74 year old male known to our practice when he was seen here in February and March and was lost to follow-up when he was admitted to hospital with various medical problems including coronary  artery disease and a stroke. Now returns with the problem on the left forefoot where he has an ulceration and this has been there for about 6 months. most recently he was in hospital between July 6 and July 16, when he was admitted and treated for acute respiratory failure is secondary to aspiration pneumonia, large non-STEMI, ischemic cardiomyopathy with systolic and diastolic congestive heart failure with ejection fraction about 15-20%, ventricular tachycardia and has been treated with amiodarone, acute on careful up at the common new acute CVA, acute chronic kidney disease stage III,  KENICHI, TREU ([id number]) Visit Report for 04/28/2019 Debridement Details Patient Name: Date of Service: MIKKEL, BURKER 04/28/2019 10:00 AM Medical Record [id number] Patient Account Number: [id number] Date of Birth/Sex: Treating RN: [birth year] (73 y.o. [name]) [name] Primary Care Provider: [name], [name] Other Clinician: Referring Provider: Treating Provider/Extender:Emilie Carp [name], [name] in Treatment: 158 Debridement Performed for Wound #18 Left,Dorsal Foot Assessment: Performed By: Physician [name], MD Debridement Type: Debridement Severity of Tissue Pre Fat layer exposed Debridement: Level of Consciousness (Pre- Awake and Alert procedure): Pre-procedure Verification/Time Out Taken: Yes - 11:16 Start Time: 11:16 Pain Control: Lidocaine 5% topical ointment Total Area Debrided (L x W): 4 (cm) x 1 (cm) = 4 (cm) Tissue and other material Viable, Non-Viable, Slough, Subcutaneous, Slough debrided: Level: Skin/Subcutaneous Tissue Debridement Description: Excisional Instrument: Curette Bleeding: Moderate Hemostasis Achieved: Pressure End Time: 11:18 Procedural Pain: 0 Post Procedural Pain: 0 Response to Treatment: Procedure was tolerated well Level of Consciousness Awake and Alert (Post-procedure): Post Debridement Measurements of Total Wound Length: (cm) 4 Width: (cm) 1 Depth: (cm) 0.1 Volume: (cm) 0.314 Character of Wound/Ulcer Post Improved Debridement: Severity of Tissue Post Debridement: Fat layer exposed Post Procedure Diagnosis Same as Pre-procedure Electronic Signature(s) Signed: 04/28/2019 5:08:58 PM By: [name] RN Signed: 04/28/2019 5:30:44 PM By: Linton Ham MD Entered By: [name] on 04/28/2019 11:19:06 -------------------------------------------------------------------------------- HPI Details Patient Name: Date of Service: [name] 04/28/2019 10:00 AM Medical Record [id number] Patient  Account Number: [id number] Date of Birth/Sex: Treating RN: 01/20/1946 (74 y.o. M) Primary Care Provider: [name], [name] Other Clinician: Referring Provider: Treating Provider/Extender:Asaf Elmquist [name], [name] in Treatment: 158 History of Present Illness Location: On the left and right lateral forefoot which has been there for about 6 months Quality: Patient reports No Pain. Severity: Patient states wound(s) are getting worse. Duration: Patient has had the wound for > 6 months prior to seeking treatment at the wound center Context: The wound would happen gradually Modifying Factors: Patient wound(s)/ulcer(s) are worsening due to :continual drainage from the wound Associated Signs and Symptoms: Patient reports having increase discharge. HPI Description: This patient returns after being seen here till the end of August and he was lost to follow-up. he has been quite debilitated laying in bed most of the time and his condition has deteriorated significantly. He has multiple ulcerations on the heel lateral forefoot and some of his toes. ======== Old notes: 74 year old male known to our practice when he was seen here in February and March and was lost to follow-up when he was admitted to hospital with various medical problems including coronary artery disease and a stroke. Now returns with the problem on the left forefoot where he has an ulceration and this has been there for about 6 months. most recently he was in hospital between July 6 and July 16, when he was admitted and treated for acute respiratory failure is secondary to aspiration pneumonia, large non-STEMI, ischemic cardiomyopathy with systolic and diastolic congestive heart failure with ejection fraction about 15-20%, ventricular tachycardia and has been treated with amiodarone, acute on careful up at the common new acute CVA, acute chronic kidney disease stage III, anemia, uncontrolled diabetes mellitus with last  hemoglobin A1c being 12%. He has had persistent hyperglycemia given recently. Patient has a past medical history of diabetes mellitus, hypertension, combined systolic and diastolic heart failure, peripheral neuropathy, gout, cardiomyopathy with ejection fraction of about 10-15%, coronary artery disease, recent ventricular fibrillation, chronic kidney disease, implantable defibrillator, sleep apnea, status post laceration  Number: [id number] Date of Birth/Sex: Treating RN: 1945-10-27 (74 y.o. M) Primary Care Provider: [name], [name] Other Clinician: Referring Provider: Treating Provider/Extender:Halim Surrette [name], [name] in Treatment: 158 Constitutional Patient is hypertensive.. Pulse regular and within target range for patient.Marland Kitchen Respirations regular, non-labored and within target range.. Temperature is normal and within the target range for the patient.Marland Kitchen Appears in no distress. Eyes Conjunctivae clear. No discharge.no icterus. Respiratory work of breathing is normal. Cardiovascular It will pulses are palpable bilaterally although certainly diminished in amplitude. Integumentary (Hair, Skin) No erythema around any of the wounds. Psychiatric appears at normal baseline. Notes Wound exam; left lateral lower extremity parallel to the tibia. A lot of this appears to be epithelialized however in the distal part of this wound there is still an open area with some depth Eatmon is required The areas on the left anterior ankle/dorsal foot are improved although still an aggressive debridement required on the left lateral ankle Electronic Signature(s) Signed: 04/28/2019 5:30:44 PM By: Linton Ham MD Entered By: Linton Ham on 04/28/2019 12:16:34 -------------------------------------------------------------------------------- Physician Orders Details Patient Name: Date of Service: [name] 04/28/2019 10:00 AM Medical Record [id number] Patient Account Number: [id number] Date of Birth/Sex: Treating RN: 1946-01-02 (73 y.o. Staci Acosta, Morey Hummingbird Primary Care Provider: [name], [name] Other Clinician: Referring Provider: Treating Provider/Extender:Kelen Laura [name], [name] in Treatment: 158 Verbal / Phone Orders: No Diagnosis Coding ICD-10 Coding Code Description L97.221 Non-pressure chronic ulcer of left calf limited to  breakdown of skin L97.321 Non-pressure chronic ulcer of left ankle limited to breakdown of skin L97.821 Non-pressure chronic ulcer of other part of left lower leg limited to breakdown of skin L97.521 Non-pressure chronic ulcer of other part of left foot limited to breakdown of skin Follow-up Appointments Return appointment in 1 month. Dressing Change Frequency Change dressing three times week. Skin Barriers/Peri-Wound Care Other: - TCA to left lower extrimity Wound Cleansing Clean wound with Wound Cleanser May shower with protection. Primary Wound Dressing Wound #14 Left,Lateral Lower Leg Other: - sorbact Wound #18 Left,Dorsal Foot Other: - sorbact Wound #22 Left,Distal Toe Great Other: - sorbact Wound #23 Right,Lateral Calcaneus Other: - sorbact Secondary Dressing Wound #14 Left,Lateral Lower Leg Dry Gauze Wound #18 Left,Dorsal Foot Dry Gauze Wound #22 Left,Distal Toe Great Dry Gauze Wound #23 Right,Lateral Calcaneus Dry Gauze Edema Control Wound #14 Left,Lateral Lower Leg Kerlix and Coban - Left Lower Extremity - use cotton roll not kerlix Avoid standing for long periods of time Elevate legs to the level of the heart or above for 30 minutes daily and/or when sitting, a frequency of: - throughout the day Wound #18 Left,Dorsal Foot Kerlix and Coban - Left Lower Extremity - use cotton roll not kerlix Avoid standing for long periods of time Elevate legs to the level of the heart or above for 30 minutes daily and/or when sitting, a frequency of: - throughout the day Wound #22 Left,Distal Toe Great Kerlix and Coban - Left Lower Extremity - use cotton roll not kerlix Avoid standing for long periods of time Elevate legs to the level of the heart or above for 30 minutes daily and/or when sitting, a frequency of: - throughout the day Wound #23 Right,Lateral Calcaneus Kerlix and Coban - Left Lower Extremity - use cotton roll not kerlix Avoid standing for long periods of  time Elevate legs to the level of the heart or above for 30 minutes daily and/or when sitting, a frequency of: - throughout the day Electronic Signature(s) Signed: 04/28/2019 5:08:58 PM By: [name]  KENICHI, TREU ([id number]) Visit Report for 04/28/2019 Debridement Details Patient Name: Date of Service: MIKKEL, BURKER 04/28/2019 10:00 AM Medical Record [id number] Patient Account Number: [id number] Date of Birth/Sex: Treating RN: [birth year] (73 y.o. [name]) [name] Primary Care Provider: [name], [name] Other Clinician: Referring Provider: Treating Provider/Extender:Emilie Carp [name], [name] in Treatment: 158 Debridement Performed for Wound #18 Left,Dorsal Foot Assessment: Performed By: Physician [name], MD Debridement Type: Debridement Severity of Tissue Pre Fat layer exposed Debridement: Level of Consciousness (Pre- Awake and Alert procedure): Pre-procedure Verification/Time Out Taken: Yes - 11:16 Start Time: 11:16 Pain Control: Lidocaine 5% topical ointment Total Area Debrided (L x W): 4 (cm) x 1 (cm) = 4 (cm) Tissue and other material Viable, Non-Viable, Slough, Subcutaneous, Slough debrided: Level: Skin/Subcutaneous Tissue Debridement Description: Excisional Instrument: Curette Bleeding: Moderate Hemostasis Achieved: Pressure End Time: 11:18 Procedural Pain: 0 Post Procedural Pain: 0 Response to Treatment: Procedure was tolerated well Level of Consciousness Awake and Alert (Post-procedure): Post Debridement Measurements of Total Wound Length: (cm) 4 Width: (cm) 1 Depth: (cm) 0.1 Volume: (cm) 0.314 Character of Wound/Ulcer Post Improved Debridement: Severity of Tissue Post Debridement: Fat layer exposed Post Procedure Diagnosis Same as Pre-procedure Electronic Signature(s) Signed: 04/28/2019 5:08:58 PM By: [name] RN Signed: 04/28/2019 5:30:44 PM By: Linton Ham MD Entered By: [name] on 04/28/2019 11:19:06 -------------------------------------------------------------------------------- HPI Details Patient Name: Date of Service: [name] 04/28/2019 10:00 AM Medical Record [id number] Patient  Account Number: [id number] Date of Birth/Sex: Treating RN: 01/20/1946 (74 y.o. M) Primary Care Provider: [name], [name] Other Clinician: Referring Provider: Treating Provider/Extender:Asaf Elmquist [name], [name] in Treatment: 158 History of Present Illness Location: On the left and right lateral forefoot which has been there for about 6 months Quality: Patient reports No Pain. Severity: Patient states wound(s) are getting worse. Duration: Patient has had the wound for > 6 months prior to seeking treatment at the wound center Context: The wound would happen gradually Modifying Factors: Patient wound(s)/ulcer(s) are worsening due to :continual drainage from the wound Associated Signs and Symptoms: Patient reports having increase discharge. HPI Description: This patient returns after being seen here till the end of August and he was lost to follow-up. he has been quite debilitated laying in bed most of the time and his condition has deteriorated significantly. He has multiple ulcerations on the heel lateral forefoot and some of his toes. ======== Old notes: 74 year old male known to our practice when he was seen here in February and March and was lost to follow-up when he was admitted to hospital with various medical problems including coronary artery disease and a stroke. Now returns with the problem on the left forefoot where he has an ulceration and this has been there for about 6 months. most recently he was in hospital between July 6 and July 16, when he was admitted and treated for acute respiratory failure is secondary to aspiration pneumonia, large non-STEMI, ischemic cardiomyopathy with systolic and diastolic congestive heart failure with ejection fraction about 15-20%, ventricular tachycardia and has been treated with amiodarone, acute on careful up at the common new acute CVA, acute chronic kidney disease stage III, anemia, uncontrolled diabetes mellitus with last  hemoglobin A1c being 12%. He has had persistent hyperglycemia given recently. Patient has a past medical history of diabetes mellitus, hypertension, combined systolic and diastolic heart failure, peripheral neuropathy, gout, cardiomyopathy with ejection fraction of about 10-15%, coronary artery disease, recent ventricular fibrillation, chronic kidney disease, implantable defibrillator, sleep apnea, status post laceration  RN Signed: 04/28/2019 5:30:44 PM By: Linton Ham MD Entered By: [name] on 04/28/2019 11:21:29 -------------------------------------------------------------------------------- Problem List Details Patient Name: Date of Service: [name] 04/28/2019 10:00 AM Medical Record [id number] Patient Account Number: [id number] Date of Birth/Sex: Treating RN: June 24, 1945 (73 y.o. [name] Primary Care Provider: [name], [name] Other Clinician: Referring Provider: Treating Provider/Extender:Ruperto Kiernan [name], [name] in Treatment: 158 Active Problems ICD-10 Evaluated Encounter Code Description Active Date Today Diagnosis L97.221 Non-pressure chronic ulcer of left calf limited to 08/15/2017 No Yes breakdown of skin L97.321 Non-pressure chronic ulcer of left ankle limited to 07/01/2018 No Yes breakdown of skin L97.521 Non-pressure chronic ulcer of other part of left foot 02/17/2019 No Yes limited to breakdown of skin L89.613 Pressure ulcer of right heel, stage 3 04/17/2016 No Yes Inactive Problems ICD-10 Code Description Active Date Inactive Date E11.621 Type 2 diabetes mellitus with foot ulcer 04/17/2016 04/17/2016 L03.032 Cellulitis of left toe 01/06/2019 01/06/2019 I25.119 Atherosclerotic heart disease of native coronary artery with 04/17/2016 04/17/2016 unspecified angina pectoris S51.811D Laceration without foreign body of right forearm, subsequent 10/22/2017 10/22/2017 encounter [id number] Acute diastolic (congestive) heart failure 04/17/2016 04/17/2016 L03.116 Cellulitis of left lower limb 12/24/2017 12/24/2017 [id number] Pressure ulcer of left heel, stage 3 12/04/2016 12/04/2016 L97.821 Non-pressure chronic ulcer of other part of left lower leg limited 01/06/2019  01/06/2019 to breakdown of skin Resolved Problems ICD-10 Code Description Active Date Resolved Date L89.512 Pressure ulcer of right ankle, stage 2 04/17/2016 04/17/2016 L89.522 Pressure ulcer of left ankle, stage 2 04/17/2016 04/17/2016 Electronic Signature(s) Signed: 04/28/2019 5:30:44 PM By: Linton Ham MD Entered By: Linton Ham on 04/28/2019 12:12:35 -------------------------------------------------------------------------------- Progress Note Details Patient Name: Date of Service: [name] 04/28/2019 10:00 AM Medical Record [id number] Patient Account Number: [id number] Date of Birth/Sex: Treating RN: 14-Feb-1946 (74 y.o. M) Primary Care Provider: [name], [name] Other Clinician: Referring Provider: Treating Provider/Extender:Constance Whittle [name], [name] in Treatment: 158 Subjective History of Present Illness (HPI) The following HPI elements were documented for the patient's wound: Location: On the left and right lateral forefoot which has been there for about 6 months Quality: Patient reports No Pain. Severity: Patient states wound(s) are getting worse. Duration: Patient has had the wound for > 6 months prior to seeking treatment at the wound center Context: The wound would happen gradually Modifying Factors: Patient wound(s)/ulcer(s) are worsening due to :continual drainage from the wound Associated Signs and Symptoms: Patient reports having increase discharge. This patient returns after being seen here till the end of August and he was lost to follow-up. he has been quite debilitated laying in bed most of the time and his condition has deteriorated significantly. He has multiple ulcerations on the heel lateral forefoot and some of his toes. ======== Old notes: 74 year old male known to our practice when he was seen here in February and March and was lost to follow-up when he was admitted to hospital with various medical problems including coronary  artery disease and a stroke. Now returns with the problem on the left forefoot where he has an ulceration and this has been there for about 6 months. most recently he was in hospital between July 6 and July 16, when he was admitted and treated for acute respiratory failure is secondary to aspiration pneumonia, large non-STEMI, ischemic cardiomyopathy with systolic and diastolic congestive heart failure with ejection fraction about 15-20%, ventricular tachycardia and has been treated with amiodarone, acute on careful up at the common new acute CVA, acute chronic kidney disease stage III,  Number: [id number] Date of Birth/Sex: Treating RN: 1945-10-27 (74 y.o. M) Primary Care Provider: [name], [name] Other Clinician: Referring Provider: Treating Provider/Extender:Halim Surrette [name], [name] in Treatment: 158 Constitutional Patient is hypertensive.. Pulse regular and within target range for patient.Marland Kitchen Respirations regular, non-labored and within target range.. Temperature is normal and within the target range for the patient.Marland Kitchen Appears in no distress. Eyes Conjunctivae clear. No discharge.no icterus. Respiratory work of breathing is normal. Cardiovascular It will pulses are palpable bilaterally although certainly diminished in amplitude. Integumentary (Hair, Skin) No erythema around any of the wounds. Psychiatric appears at normal baseline. Notes Wound exam; left lateral lower extremity parallel to the tibia. A lot of this appears to be epithelialized however in the distal part of this wound there is still an open area with some depth Eatmon is required The areas on the left anterior ankle/dorsal foot are improved although still an aggressive debridement required on the left lateral ankle Electronic Signature(s) Signed: 04/28/2019 5:30:44 PM By: Linton Ham MD Entered By: Linton Ham on 04/28/2019 12:16:34 -------------------------------------------------------------------------------- Physician Orders Details Patient Name: Date of Service: [name] 04/28/2019 10:00 AM Medical Record [id number] Patient Account Number: [id number] Date of Birth/Sex: Treating RN: 1946-01-02 (73 y.o. Staci Acosta, Morey Hummingbird Primary Care Provider: [name], [name] Other Clinician: Referring Provider: Treating Provider/Extender:Kelen Laura [name], [name] in Treatment: 158 Verbal / Phone Orders: No Diagnosis Coding ICD-10 Coding Code Description L97.221 Non-pressure chronic ulcer of left calf limited to  breakdown of skin L97.321 Non-pressure chronic ulcer of left ankle limited to breakdown of skin L97.821 Non-pressure chronic ulcer of other part of left lower leg limited to breakdown of skin L97.521 Non-pressure chronic ulcer of other part of left foot limited to breakdown of skin Follow-up Appointments Return appointment in 1 month. Dressing Change Frequency Change dressing three times week. Skin Barriers/Peri-Wound Care Other: - TCA to left lower extrimity Wound Cleansing Clean wound with Wound Cleanser May shower with protection. Primary Wound Dressing Wound #14 Left,Lateral Lower Leg Other: - sorbact Wound #18 Left,Dorsal Foot Other: - sorbact Wound #22 Left,Distal Toe Great Other: - sorbact Wound #23 Right,Lateral Calcaneus Other: - sorbact Secondary Dressing Wound #14 Left,Lateral Lower Leg Dry Gauze Wound #18 Left,Dorsal Foot Dry Gauze Wound #22 Left,Distal Toe Great Dry Gauze Wound #23 Right,Lateral Calcaneus Dry Gauze Edema Control Wound #14 Left,Lateral Lower Leg Kerlix and Coban - Left Lower Extremity - use cotton roll not kerlix Avoid standing for long periods of time Elevate legs to the level of the heart or above for 30 minutes daily and/or when sitting, a frequency of: - throughout the day Wound #18 Left,Dorsal Foot Kerlix and Coban - Left Lower Extremity - use cotton roll not kerlix Avoid standing for long periods of time Elevate legs to the level of the heart or above for 30 minutes daily and/or when sitting, a frequency of: - throughout the day Wound #22 Left,Distal Toe Great Kerlix and Coban - Left Lower Extremity - use cotton roll not kerlix Avoid standing for long periods of time Elevate legs to the level of the heart or above for 30 minutes daily and/or when sitting, a frequency of: - throughout the day Wound #23 Right,Lateral Calcaneus Kerlix and Coban - Left Lower Extremity - use cotton roll not kerlix Avoid standing for long periods of  time Elevate legs to the level of the heart or above for 30 minutes daily and/or when sitting, a frequency of: - throughout the day Electronic Signature(s) Signed: 04/28/2019 5:08:58 PM By: [name]  RN Signed: 04/28/2019 5:30:44 PM By: Linton Ham MD Entered By: [name] on 04/28/2019 11:21:29 -------------------------------------------------------------------------------- Problem List Details Patient Name: Date of Service: [name] 04/28/2019 10:00 AM Medical Record [id number] Patient Account Number: [id number] Date of Birth/Sex: Treating RN: June 24, 1945 (73 y.o. [name] Primary Care Provider: [name], [name] Other Clinician: Referring Provider: Treating Provider/Extender:Ruperto Kiernan [name], [name] in Treatment: 158 Active Problems ICD-10 Evaluated Encounter Code Description Active Date Today Diagnosis L97.221 Non-pressure chronic ulcer of left calf limited to 08/15/2017 No Yes breakdown of skin L97.321 Non-pressure chronic ulcer of left ankle limited to 07/01/2018 No Yes breakdown of skin L97.521 Non-pressure chronic ulcer of other part of left foot 02/17/2019 No Yes limited to breakdown of skin L89.613 Pressure ulcer of right heel, stage 3 04/17/2016 No Yes Inactive Problems ICD-10 Code Description Active Date Inactive Date E11.621 Type 2 diabetes mellitus with foot ulcer 04/17/2016 04/17/2016 L03.032 Cellulitis of left toe 01/06/2019 01/06/2019 I25.119 Atherosclerotic heart disease of native coronary artery with 04/17/2016 04/17/2016 unspecified angina pectoris S51.811D Laceration without foreign body of right forearm, subsequent 10/22/2017 10/22/2017 encounter [id number] Acute diastolic (congestive) heart failure 04/17/2016 04/17/2016 L03.116 Cellulitis of left lower limb 12/24/2017 12/24/2017 [id number] Pressure ulcer of left heel, stage 3 12/04/2016 12/04/2016 L97.821 Non-pressure chronic ulcer of other part of left lower leg limited 01/06/2019  01/06/2019 to breakdown of skin Resolved Problems ICD-10 Code Description Active Date Resolved Date L89.512 Pressure ulcer of right ankle, stage 2 04/17/2016 04/17/2016 L89.522 Pressure ulcer of left ankle, stage 2 04/17/2016 04/17/2016 Electronic Signature(s) Signed: 04/28/2019 5:30:44 PM By: Linton Ham MD Entered By: Linton Ham on 04/28/2019 12:12:35 -------------------------------------------------------------------------------- Progress Note Details Patient Name: Date of Service: [name] 04/28/2019 10:00 AM Medical Record [id number] Patient Account Number: [id number] Date of Birth/Sex: Treating RN: 14-Feb-1946 (74 y.o. M) Primary Care Provider: [name], [name] Other Clinician: Referring Provider: Treating Provider/Extender:Constance Whittle [name], [name] in Treatment: 158 Subjective History of Present Illness (HPI) The following HPI elements were documented for the patient's wound: Location: On the left and right lateral forefoot which has been there for about 6 months Quality: Patient reports No Pain. Severity: Patient states wound(s) are getting worse. Duration: Patient has had the wound for > 6 months prior to seeking treatment at the wound center Context: The wound would happen gradually Modifying Factors: Patient wound(s)/ulcer(s) are worsening due to :continual drainage from the wound Associated Signs and Symptoms: Patient reports having increase discharge. This patient returns after being seen here till the end of August and he was lost to follow-up. he has been quite debilitated laying in bed most of the time and his condition has deteriorated significantly. He has multiple ulcerations on the heel lateral forefoot and some of his toes. ======== Old notes: 74 year old male known to our practice when he was seen here in February and March and was lost to follow-up when he was admitted to hospital with various medical problems including coronary  artery disease and a stroke. Now returns with the problem on the left forefoot where he has an ulceration and this has been there for about 6 months. most recently he was in hospital between July 6 and July 16, when he was admitted and treated for acute respiratory failure is secondary to aspiration pneumonia, large non-STEMI, ischemic cardiomyopathy with systolic and diastolic congestive heart failure with ejection fraction about 15-20%, ventricular tachycardia and has been treated with amiodarone, acute on careful up at the common new acute CVA, acute chronic kidney disease stage III,  KENICHI, TREU ([id number]) Visit Report for 04/28/2019 Debridement Details Patient Name: Date of Service: MIKKEL, BURKER 04/28/2019 10:00 AM Medical Record [id number] Patient Account Number: [id number] Date of Birth/Sex: Treating RN: [birth year] (73 y.o. [name]) [name] Primary Care Provider: [name], [name] Other Clinician: Referring Provider: Treating Provider/Extender:Emilie Carp [name], [name] in Treatment: 158 Debridement Performed for Wound #18 Left,Dorsal Foot Assessment: Performed By: Physician [name], MD Debridement Type: Debridement Severity of Tissue Pre Fat layer exposed Debridement: Level of Consciousness (Pre- Awake and Alert procedure): Pre-procedure Verification/Time Out Taken: Yes - 11:16 Start Time: 11:16 Pain Control: Lidocaine 5% topical ointment Total Area Debrided (L x W): 4 (cm) x 1 (cm) = 4 (cm) Tissue and other material Viable, Non-Viable, Slough, Subcutaneous, Slough debrided: Level: Skin/Subcutaneous Tissue Debridement Description: Excisional Instrument: Curette Bleeding: Moderate Hemostasis Achieved: Pressure End Time: 11:18 Procedural Pain: 0 Post Procedural Pain: 0 Response to Treatment: Procedure was tolerated well Level of Consciousness Awake and Alert (Post-procedure): Post Debridement Measurements of Total Wound Length: (cm) 4 Width: (cm) 1 Depth: (cm) 0.1 Volume: (cm) 0.314 Character of Wound/Ulcer Post Improved Debridement: Severity of Tissue Post Debridement: Fat layer exposed Post Procedure Diagnosis Same as Pre-procedure Electronic Signature(s) Signed: 04/28/2019 5:08:58 PM By: [name] RN Signed: 04/28/2019 5:30:44 PM By: Linton Ham MD Entered By: [name] on 04/28/2019 11:19:06 -------------------------------------------------------------------------------- HPI Details Patient Name: Date of Service: [name] 04/28/2019 10:00 AM Medical Record [id number] Patient  Account Number: [id number] Date of Birth/Sex: Treating RN: 01/20/1946 (74 y.o. M) Primary Care Provider: [name], [name] Other Clinician: Referring Provider: Treating Provider/Extender:Asaf Elmquist [name], [name] in Treatment: 158 History of Present Illness Location: On the left and right lateral forefoot which has been there for about 6 months Quality: Patient reports No Pain. Severity: Patient states wound(s) are getting worse. Duration: Patient has had the wound for > 6 months prior to seeking treatment at the wound center Context: The wound would happen gradually Modifying Factors: Patient wound(s)/ulcer(s) are worsening due to :continual drainage from the wound Associated Signs and Symptoms: Patient reports having increase discharge. HPI Description: This patient returns after being seen here till the end of August and he was lost to follow-up. he has been quite debilitated laying in bed most of the time and his condition has deteriorated significantly. He has multiple ulcerations on the heel lateral forefoot and some of his toes. ======== Old notes: 74 year old male known to our practice when he was seen here in February and March and was lost to follow-up when he was admitted to hospital with various medical problems including coronary artery disease and a stroke. Now returns with the problem on the left forefoot where he has an ulceration and this has been there for about 6 months. most recently he was in hospital between July 6 and July 16, when he was admitted and treated for acute respiratory failure is secondary to aspiration pneumonia, large non-STEMI, ischemic cardiomyopathy with systolic and diastolic congestive heart failure with ejection fraction about 15-20%, ventricular tachycardia and has been treated with amiodarone, acute on careful up at the common new acute CVA, acute chronic kidney disease stage III, anemia, uncontrolled diabetes mellitus with last  hemoglobin A1c being 12%. He has had persistent hyperglycemia given recently. Patient has a past medical history of diabetes mellitus, hypertension, combined systolic and diastolic heart failure, peripheral neuropathy, gout, cardiomyopathy with ejection fraction of about 10-15%, coronary artery disease, recent ventricular fibrillation, chronic kidney disease, implantable defibrillator, sleep apnea, status post laceration  Number: [id number] Date of Birth/Sex: Treating RN: 1945-10-27 (74 y.o. M) Primary Care Provider: [name], [name] Other Clinician: Referring Provider: Treating Provider/Extender:Halim Surrette [name], [name] in Treatment: 158 Constitutional Patient is hypertensive.. Pulse regular and within target range for patient.Marland Kitchen Respirations regular, non-labored and within target range.. Temperature is normal and within the target range for the patient.Marland Kitchen Appears in no distress. Eyes Conjunctivae clear. No discharge.no icterus. Respiratory work of breathing is normal. Cardiovascular It will pulses are palpable bilaterally although certainly diminished in amplitude. Integumentary (Hair, Skin) No erythema around any of the wounds. Psychiatric appears at normal baseline. Notes Wound exam; left lateral lower extremity parallel to the tibia. A lot of this appears to be epithelialized however in the distal part of this wound there is still an open area with some depth Eatmon is required The areas on the left anterior ankle/dorsal foot are improved although still an aggressive debridement required on the left lateral ankle Electronic Signature(s) Signed: 04/28/2019 5:30:44 PM By: Linton Ham MD Entered By: Linton Ham on 04/28/2019 12:16:34 -------------------------------------------------------------------------------- Physician Orders Details Patient Name: Date of Service: [name] 04/28/2019 10:00 AM Medical Record [id number] Patient Account Number: [id number] Date of Birth/Sex: Treating RN: 1946-01-02 (73 y.o. Staci Acosta, Morey Hummingbird Primary Care Provider: [name], [name] Other Clinician: Referring Provider: Treating Provider/Extender:Kelen Laura [name], [name] in Treatment: 158 Verbal / Phone Orders: No Diagnosis Coding ICD-10 Coding Code Description L97.221 Non-pressure chronic ulcer of left calf limited to  breakdown of skin L97.321 Non-pressure chronic ulcer of left ankle limited to breakdown of skin L97.821 Non-pressure chronic ulcer of other part of left lower leg limited to breakdown of skin L97.521 Non-pressure chronic ulcer of other part of left foot limited to breakdown of skin Follow-up Appointments Return appointment in 1 month. Dressing Change Frequency Change dressing three times week. Skin Barriers/Peri-Wound Care Other: - TCA to left lower extrimity Wound Cleansing Clean wound with Wound Cleanser May shower with protection. Primary Wound Dressing Wound #14 Left,Lateral Lower Leg Other: - sorbact Wound #18 Left,Dorsal Foot Other: - sorbact Wound #22 Left,Distal Toe Great Other: - sorbact Wound #23 Right,Lateral Calcaneus Other: - sorbact Secondary Dressing Wound #14 Left,Lateral Lower Leg Dry Gauze Wound #18 Left,Dorsal Foot Dry Gauze Wound #22 Left,Distal Toe Great Dry Gauze Wound #23 Right,Lateral Calcaneus Dry Gauze Edema Control Wound #14 Left,Lateral Lower Leg Kerlix and Coban - Left Lower Extremity - use cotton roll not kerlix Avoid standing for long periods of time Elevate legs to the level of the heart or above for 30 minutes daily and/or when sitting, a frequency of: - throughout the day Wound #18 Left,Dorsal Foot Kerlix and Coban - Left Lower Extremity - use cotton roll not kerlix Avoid standing for long periods of time Elevate legs to the level of the heart or above for 30 minutes daily and/or when sitting, a frequency of: - throughout the day Wound #22 Left,Distal Toe Great Kerlix and Coban - Left Lower Extremity - use cotton roll not kerlix Avoid standing for long periods of time Elevate legs to the level of the heart or above for 30 minutes daily and/or when sitting, a frequency of: - throughout the day Wound #23 Right,Lateral Calcaneus Kerlix and Coban - Left Lower Extremity - use cotton roll not kerlix Avoid standing for long periods of  time Elevate legs to the level of the heart or above for 30 minutes daily and/or when sitting, a frequency of: - throughout the day Electronic Signature(s) Signed: 04/28/2019 5:08:58 PM By: [name]  Number: [id number] Date of Birth/Sex: Treating RN: 1945-10-27 (74 y.o. M) Primary Care Provider: [name], [name] Other Clinician: Referring Provider: Treating Provider/Extender:Halim Surrette [name], [name] in Treatment: 158 Constitutional Patient is hypertensive.. Pulse regular and within target range for patient.Marland Kitchen Respirations regular, non-labored and within target range.. Temperature is normal and within the target range for the patient.Marland Kitchen Appears in no distress. Eyes Conjunctivae clear. No discharge.no icterus. Respiratory work of breathing is normal. Cardiovascular It will pulses are palpable bilaterally although certainly diminished in amplitude. Integumentary (Hair, Skin) No erythema around any of the wounds. Psychiatric appears at normal baseline. Notes Wound exam; left lateral lower extremity parallel to the tibia. A lot of this appears to be epithelialized however in the distal part of this wound there is still an open area with some depth Eatmon is required The areas on the left anterior ankle/dorsal foot are improved although still an aggressive debridement required on the left lateral ankle Electronic Signature(s) Signed: 04/28/2019 5:30:44 PM By: Linton Ham MD Entered By: Linton Ham on 04/28/2019 12:16:34 -------------------------------------------------------------------------------- Physician Orders Details Patient Name: Date of Service: [name] 04/28/2019 10:00 AM Medical Record [id number] Patient Account Number: [id number] Date of Birth/Sex: Treating RN: 1946-01-02 (73 y.o. Staci Acosta, Morey Hummingbird Primary Care Provider: [name], [name] Other Clinician: Referring Provider: Treating Provider/Extender:Kelen Laura [name], [name] in Treatment: 158 Verbal / Phone Orders: No Diagnosis Coding ICD-10 Coding Code Description L97.221 Non-pressure chronic ulcer of left calf limited to  breakdown of skin L97.321 Non-pressure chronic ulcer of left ankle limited to breakdown of skin L97.821 Non-pressure chronic ulcer of other part of left lower leg limited to breakdown of skin L97.521 Non-pressure chronic ulcer of other part of left foot limited to breakdown of skin Follow-up Appointments Return appointment in 1 month. Dressing Change Frequency Change dressing three times week. Skin Barriers/Peri-Wound Care Other: - TCA to left lower extrimity Wound Cleansing Clean wound with Wound Cleanser May shower with protection. Primary Wound Dressing Wound #14 Left,Lateral Lower Leg Other: - sorbact Wound #18 Left,Dorsal Foot Other: - sorbact Wound #22 Left,Distal Toe Great Other: - sorbact Wound #23 Right,Lateral Calcaneus Other: - sorbact Secondary Dressing Wound #14 Left,Lateral Lower Leg Dry Gauze Wound #18 Left,Dorsal Foot Dry Gauze Wound #22 Left,Distal Toe Great Dry Gauze Wound #23 Right,Lateral Calcaneus Dry Gauze Edema Control Wound #14 Left,Lateral Lower Leg Kerlix and Coban - Left Lower Extremity - use cotton roll not kerlix Avoid standing for long periods of time Elevate legs to the level of the heart or above for 30 minutes daily and/or when sitting, a frequency of: - throughout the day Wound #18 Left,Dorsal Foot Kerlix and Coban - Left Lower Extremity - use cotton roll not kerlix Avoid standing for long periods of time Elevate legs to the level of the heart or above for 30 minutes daily and/or when sitting, a frequency of: - throughout the day Wound #22 Left,Distal Toe Great Kerlix and Coban - Left Lower Extremity - use cotton roll not kerlix Avoid standing for long periods of time Elevate legs to the level of the heart or above for 30 minutes daily and/or when sitting, a frequency of: - throughout the day Wound #23 Right,Lateral Calcaneus Kerlix and Coban - Left Lower Extremity - use cotton roll not kerlix Avoid standing for long periods of  time Elevate legs to the level of the heart or above for 30 minutes daily and/or when sitting, a frequency of: - throughout the day Electronic Signature(s) Signed: 04/28/2019 5:08:58 PM By: [name]  RN Signed: 04/28/2019 5:30:44 PM By: Linton Ham MD Entered By: [name] on 04/28/2019 11:21:29 -------------------------------------------------------------------------------- Problem List Details Patient Name: Date of Service: [name] 04/28/2019 10:00 AM Medical Record [id number] Patient Account Number: [id number] Date of Birth/Sex: Treating RN: June 24, 1945 (73 y.o. [name] Primary Care Provider: [name], [name] Other Clinician: Referring Provider: Treating Provider/Extender:Ruperto Kiernan [name], [name] in Treatment: 158 Active Problems ICD-10 Evaluated Encounter Code Description Active Date Today Diagnosis L97.221 Non-pressure chronic ulcer of left calf limited to 08/15/2017 No Yes breakdown of skin L97.321 Non-pressure chronic ulcer of left ankle limited to 07/01/2018 No Yes breakdown of skin L97.521 Non-pressure chronic ulcer of other part of left foot 02/17/2019 No Yes limited to breakdown of skin L89.613 Pressure ulcer of right heel, stage 3 04/17/2016 No Yes Inactive Problems ICD-10 Code Description Active Date Inactive Date E11.621 Type 2 diabetes mellitus with foot ulcer 04/17/2016 04/17/2016 L03.032 Cellulitis of left toe 01/06/2019 01/06/2019 I25.119 Atherosclerotic heart disease of native coronary artery with 04/17/2016 04/17/2016 unspecified angina pectoris S51.811D Laceration without foreign body of right forearm, subsequent 10/22/2017 10/22/2017 encounter [id number] Acute diastolic (congestive) heart failure 04/17/2016 04/17/2016 L03.116 Cellulitis of left lower limb 12/24/2017 12/24/2017 [id number] Pressure ulcer of left heel, stage 3 12/04/2016 12/04/2016 L97.821 Non-pressure chronic ulcer of other part of left lower leg limited 01/06/2019  01/06/2019 to breakdown of skin Resolved Problems ICD-10 Code Description Active Date Resolved Date L89.512 Pressure ulcer of right ankle, stage 2 04/17/2016 04/17/2016 L89.522 Pressure ulcer of left ankle, stage 2 04/17/2016 04/17/2016 Electronic Signature(s) Signed: 04/28/2019 5:30:44 PM By: Linton Ham MD Entered By: Linton Ham on 04/28/2019 12:12:35 -------------------------------------------------------------------------------- Progress Note Details Patient Name: Date of Service: [name] 04/28/2019 10:00 AM Medical Record [id number] Patient Account Number: [id number] Date of Birth/Sex: Treating RN: 14-Feb-1946 (74 y.o. M) Primary Care Provider: [name], [name] Other Clinician: Referring Provider: Treating Provider/Extender:Constance Whittle [name], [name] in Treatment: 158 Subjective History of Present Illness (HPI) The following HPI elements were documented for the patient's wound: Location: On the left and right lateral forefoot which has been there for about 6 months Quality: Patient reports No Pain. Severity: Patient states wound(s) are getting worse. Duration: Patient has had the wound for > 6 months prior to seeking treatment at the wound center Context: The wound would happen gradually Modifying Factors: Patient wound(s)/ulcer(s) are worsening due to :continual drainage from the wound Associated Signs and Symptoms: Patient reports having increase discharge. This patient returns after being seen here till the end of August and he was lost to follow-up. he has been quite debilitated laying in bed most of the time and his condition has deteriorated significantly. He has multiple ulcerations on the heel lateral forefoot and some of his toes. ======== Old notes: 74 year old male known to our practice when he was seen here in February and March and was lost to follow-up when he was admitted to hospital with various medical problems including coronary  artery disease and a stroke. Now returns with the problem on the left forefoot where he has an ulceration and this has been there for about 6 months. most recently he was in hospital between July 6 and July 16, when he was admitted and treated for acute respiratory failure is secondary to aspiration pneumonia, large non-STEMI, ischemic cardiomyopathy with systolic and diastolic congestive heart failure with ejection fraction about 15-20%, ventricular tachycardia and has been treated with amiodarone, acute on careful up at the common new acute CVA, acute chronic kidney disease stage III,  Number: [id number] Date of Birth/Sex: Treating RN: 1945-10-27 (74 y.o. M) Primary Care Provider: [name], [name] Other Clinician: Referring Provider: Treating Provider/Extender:Halim Surrette [name], [name] in Treatment: 158 Constitutional Patient is hypertensive.. Pulse regular and within target range for patient.Marland Kitchen Respirations regular, non-labored and within target range.. Temperature is normal and within the target range for the patient.Marland Kitchen Appears in no distress. Eyes Conjunctivae clear. No discharge.no icterus. Respiratory work of breathing is normal. Cardiovascular It will pulses are palpable bilaterally although certainly diminished in amplitude. Integumentary (Hair, Skin) No erythema around any of the wounds. Psychiatric appears at normal baseline. Notes Wound exam; left lateral lower extremity parallel to the tibia. A lot of this appears to be epithelialized however in the distal part of this wound there is still an open area with some depth Eatmon is required The areas on the left anterior ankle/dorsal foot are improved although still an aggressive debridement required on the left lateral ankle Electronic Signature(s) Signed: 04/28/2019 5:30:44 PM By: Linton Ham MD Entered By: Linton Ham on 04/28/2019 12:16:34 -------------------------------------------------------------------------------- Physician Orders Details Patient Name: Date of Service: [name] 04/28/2019 10:00 AM Medical Record [id number] Patient Account Number: [id number] Date of Birth/Sex: Treating RN: 1946-01-02 (73 y.o. Staci Acosta, Morey Hummingbird Primary Care Provider: [name], [name] Other Clinician: Referring Provider: Treating Provider/Extender:Kelen Laura [name], [name] in Treatment: 158 Verbal / Phone Orders: No Diagnosis Coding ICD-10 Coding Code Description L97.221 Non-pressure chronic ulcer of left calf limited to  breakdown of skin L97.321 Non-pressure chronic ulcer of left ankle limited to breakdown of skin L97.821 Non-pressure chronic ulcer of other part of left lower leg limited to breakdown of skin L97.521 Non-pressure chronic ulcer of other part of left foot limited to breakdown of skin Follow-up Appointments Return appointment in 1 month. Dressing Change Frequency Change dressing three times week. Skin Barriers/Peri-Wound Care Other: - TCA to left lower extrimity Wound Cleansing Clean wound with Wound Cleanser May shower with protection. Primary Wound Dressing Wound #14 Left,Lateral Lower Leg Other: - sorbact Wound #18 Left,Dorsal Foot Other: - sorbact Wound #22 Left,Distal Toe Great Other: - sorbact Wound #23 Right,Lateral Calcaneus Other: - sorbact Secondary Dressing Wound #14 Left,Lateral Lower Leg Dry Gauze Wound #18 Left,Dorsal Foot Dry Gauze Wound #22 Left,Distal Toe Great Dry Gauze Wound #23 Right,Lateral Calcaneus Dry Gauze Edema Control Wound #14 Left,Lateral Lower Leg Kerlix and Coban - Left Lower Extremity - use cotton roll not kerlix Avoid standing for long periods of time Elevate legs to the level of the heart or above for 30 minutes daily and/or when sitting, a frequency of: - throughout the day Wound #18 Left,Dorsal Foot Kerlix and Coban - Left Lower Extremity - use cotton roll not kerlix Avoid standing for long periods of time Elevate legs to the level of the heart or above for 30 minutes daily and/or when sitting, a frequency of: - throughout the day Wound #22 Left,Distal Toe Great Kerlix and Coban - Left Lower Extremity - use cotton roll not kerlix Avoid standing for long periods of time Elevate legs to the level of the heart or above for 30 minutes daily and/or when sitting, a frequency of: - throughout the day Wound #23 Right,Lateral Calcaneus Kerlix and Coban - Left Lower Extremity - use cotton roll not kerlix Avoid standing for long periods of  time Elevate legs to the level of the heart or above for 30 minutes daily and/or when sitting, a frequency of: - throughout the day Electronic Signature(s) Signed: 04/28/2019 5:08:58 PM By: [name]  RN Signed: 04/28/2019 5:30:44 PM By: Linton Ham MD Entered By: [name] on 04/28/2019 11:21:29 -------------------------------------------------------------------------------- Problem List Details Patient Name: Date of Service: [name] 04/28/2019 10:00 AM Medical Record [id number] Patient Account Number: [id number] Date of Birth/Sex: Treating RN: June 24, 1945 (73 y.o. [name] Primary Care Provider: [name], [name] Other Clinician: Referring Provider: Treating Provider/Extender:Ruperto Kiernan [name], [name] in Treatment: 158 Active Problems ICD-10 Evaluated Encounter Code Description Active Date Today Diagnosis L97.221 Non-pressure chronic ulcer of left calf limited to 08/15/2017 No Yes breakdown of skin L97.321 Non-pressure chronic ulcer of left ankle limited to 07/01/2018 No Yes breakdown of skin L97.521 Non-pressure chronic ulcer of other part of left foot 02/17/2019 No Yes limited to breakdown of skin L89.613 Pressure ulcer of right heel, stage 3 04/17/2016 No Yes Inactive Problems ICD-10 Code Description Active Date Inactive Date E11.621 Type 2 diabetes mellitus with foot ulcer 04/17/2016 04/17/2016 L03.032 Cellulitis of left toe 01/06/2019 01/06/2019 I25.119 Atherosclerotic heart disease of native coronary artery with 04/17/2016 04/17/2016 unspecified angina pectoris S51.811D Laceration without foreign body of right forearm, subsequent 10/22/2017 10/22/2017 encounter [id number] Acute diastolic (congestive) heart failure 04/17/2016 04/17/2016 L03.116 Cellulitis of left lower limb 12/24/2017 12/24/2017 [id number] Pressure ulcer of left heel, stage 3 12/04/2016 12/04/2016 L97.821 Non-pressure chronic ulcer of other part of left lower leg limited 01/06/2019  01/06/2019 to breakdown of skin Resolved Problems ICD-10 Code Description Active Date Resolved Date L89.512 Pressure ulcer of right ankle, stage 2 04/17/2016 04/17/2016 L89.522 Pressure ulcer of left ankle, stage 2 04/17/2016 04/17/2016 Electronic Signature(s) Signed: 04/28/2019 5:30:44 PM By: Linton Ham MD Entered By: Linton Ham on 04/28/2019 12:12:35 -------------------------------------------------------------------------------- Progress Note Details Patient Name: Date of Service: [name] 04/28/2019 10:00 AM Medical Record [id number] Patient Account Number: [id number] Date of Birth/Sex: Treating RN: 14-Feb-1946 (74 y.o. M) Primary Care Provider: [name], [name] Other Clinician: Referring Provider: Treating Provider/Extender:Constance Whittle [name], [name] in Treatment: 158 Subjective History of Present Illness (HPI) The following HPI elements were documented for the patient's wound: Location: On the left and right lateral forefoot which has been there for about 6 months Quality: Patient reports No Pain. Severity: Patient states wound(s) are getting worse. Duration: Patient has had the wound for > 6 months prior to seeking treatment at the wound center Context: The wound would happen gradually Modifying Factors: Patient wound(s)/ulcer(s) are worsening due to :continual drainage from the wound Associated Signs and Symptoms: Patient reports having increase discharge. This patient returns after being seen here till the end of August and he was lost to follow-up. he has been quite debilitated laying in bed most of the time and his condition has deteriorated significantly. He has multiple ulcerations on the heel lateral forefoot and some of his toes. ======== Old notes: 74 year old male known to our practice when he was seen here in February and March and was lost to follow-up when he was admitted to hospital with various medical problems including coronary  artery disease and a stroke. Now returns with the problem on the left forefoot where he has an ulceration and this has been there for about 6 months. most recently he was in hospital between July 6 and July 16, when he was admitted and treated for acute respiratory failure is secondary to aspiration pneumonia, large non-STEMI, ischemic cardiomyopathy with systolic and diastolic congestive heart failure with ejection fraction about 15-20%, ventricular tachycardia and has been treated with amiodarone, acute on careful up at the common new acute CVA, acute chronic kidney disease stage III,  KENICHI, TREU ([id number]) Visit Report for 04/28/2019 Debridement Details Patient Name: Date of Service: MIKKEL, BURKER 04/28/2019 10:00 AM Medical Record [id number] Patient Account Number: [id number] Date of Birth/Sex: Treating RN: [birth year] (73 y.o. [name]) [name] Primary Care Provider: [name], [name] Other Clinician: Referring Provider: Treating Provider/Extender:Emilie Carp [name], [name] in Treatment: 158 Debridement Performed for Wound #18 Left,Dorsal Foot Assessment: Performed By: Physician [name], MD Debridement Type: Debridement Severity of Tissue Pre Fat layer exposed Debridement: Level of Consciousness (Pre- Awake and Alert procedure): Pre-procedure Verification/Time Out Taken: Yes - 11:16 Start Time: 11:16 Pain Control: Lidocaine 5% topical ointment Total Area Debrided (L x W): 4 (cm) x 1 (cm) = 4 (cm) Tissue and other material Viable, Non-Viable, Slough, Subcutaneous, Slough debrided: Level: Skin/Subcutaneous Tissue Debridement Description: Excisional Instrument: Curette Bleeding: Moderate Hemostasis Achieved: Pressure End Time: 11:18 Procedural Pain: 0 Post Procedural Pain: 0 Response to Treatment: Procedure was tolerated well Level of Consciousness Awake and Alert (Post-procedure): Post Debridement Measurements of Total Wound Length: (cm) 4 Width: (cm) 1 Depth: (cm) 0.1 Volume: (cm) 0.314 Character of Wound/Ulcer Post Improved Debridement: Severity of Tissue Post Debridement: Fat layer exposed Post Procedure Diagnosis Same as Pre-procedure Electronic Signature(s) Signed: 04/28/2019 5:08:58 PM By: [name] RN Signed: 04/28/2019 5:30:44 PM By: Linton Ham MD Entered By: [name] on 04/28/2019 11:19:06 -------------------------------------------------------------------------------- HPI Details Patient Name: Date of Service: [name] 04/28/2019 10:00 AM Medical Record [id number] Patient  Account Number: [id number] Date of Birth/Sex: Treating RN: 01/20/1946 (74 y.o. M) Primary Care Provider: [name], [name] Other Clinician: Referring Provider: Treating Provider/Extender:Asaf Elmquist [name], [name] in Treatment: 158 History of Present Illness Location: On the left and right lateral forefoot which has been there for about 6 months Quality: Patient reports No Pain. Severity: Patient states wound(s) are getting worse. Duration: Patient has had the wound for > 6 months prior to seeking treatment at the wound center Context: The wound would happen gradually Modifying Factors: Patient wound(s)/ulcer(s) are worsening due to :continual drainage from the wound Associated Signs and Symptoms: Patient reports having increase discharge. HPI Description: This patient returns after being seen here till the end of August and he was lost to follow-up. he has been quite debilitated laying in bed most of the time and his condition has deteriorated significantly. He has multiple ulcerations on the heel lateral forefoot and some of his toes. ======== Old notes: 74 year old male known to our practice when he was seen here in February and March and was lost to follow-up when he was admitted to hospital with various medical problems including coronary artery disease and a stroke. Now returns with the problem on the left forefoot where he has an ulceration and this has been there for about 6 months. most recently he was in hospital between July 6 and July 16, when he was admitted and treated for acute respiratory failure is secondary to aspiration pneumonia, large non-STEMI, ischemic cardiomyopathy with systolic and diastolic congestive heart failure with ejection fraction about 15-20%, ventricular tachycardia and has been treated with amiodarone, acute on careful up at the common new acute CVA, acute chronic kidney disease stage III, anemia, uncontrolled diabetes mellitus with last  hemoglobin A1c being 12%. He has had persistent hyperglycemia given recently. Patient has a past medical history of diabetes mellitus, hypertension, combined systolic and diastolic heart failure, peripheral neuropathy, gout, cardiomyopathy with ejection fraction of about 10-15%, coronary artery disease, recent ventricular fibrillation, chronic kidney disease, implantable defibrillator, sleep apnea, status post laceration  Number: [id number] Date of Birth/Sex: Treating RN: 1945-10-27 (74 y.o. M) Primary Care Provider: [name], [name] Other Clinician: Referring Provider: Treating Provider/Extender:Halim Surrette [name], [name] in Treatment: 158 Constitutional Patient is hypertensive.. Pulse regular and within target range for patient.Marland Kitchen Respirations regular, non-labored and within target range.. Temperature is normal and within the target range for the patient.Marland Kitchen Appears in no distress. Eyes Conjunctivae clear. No discharge.no icterus. Respiratory work of breathing is normal. Cardiovascular It will pulses are palpable bilaterally although certainly diminished in amplitude. Integumentary (Hair, Skin) No erythema around any of the wounds. Psychiatric appears at normal baseline. Notes Wound exam; left lateral lower extremity parallel to the tibia. A lot of this appears to be epithelialized however in the distal part of this wound there is still an open area with some depth Eatmon is required The areas on the left anterior ankle/dorsal foot are improved although still an aggressive debridement required on the left lateral ankle Electronic Signature(s) Signed: 04/28/2019 5:30:44 PM By: Linton Ham MD Entered By: Linton Ham on 04/28/2019 12:16:34 -------------------------------------------------------------------------------- Physician Orders Details Patient Name: Date of Service: [name] 04/28/2019 10:00 AM Medical Record [id number] Patient Account Number: [id number] Date of Birth/Sex: Treating RN: 1946-01-02 (73 y.o. Staci Acosta, Morey Hummingbird Primary Care Provider: [name], [name] Other Clinician: Referring Provider: Treating Provider/Extender:Kelen Laura [name], [name] in Treatment: 158 Verbal / Phone Orders: No Diagnosis Coding ICD-10 Coding Code Description L97.221 Non-pressure chronic ulcer of left calf limited to  breakdown of skin L97.321 Non-pressure chronic ulcer of left ankle limited to breakdown of skin L97.821 Non-pressure chronic ulcer of other part of left lower leg limited to breakdown of skin L97.521 Non-pressure chronic ulcer of other part of left foot limited to breakdown of skin Follow-up Appointments Return appointment in 1 month. Dressing Change Frequency Change dressing three times week. Skin Barriers/Peri-Wound Care Other: - TCA to left lower extrimity Wound Cleansing Clean wound with Wound Cleanser May shower with protection. Primary Wound Dressing Wound #14 Left,Lateral Lower Leg Other: - sorbact Wound #18 Left,Dorsal Foot Other: - sorbact Wound #22 Left,Distal Toe Great Other: - sorbact Wound #23 Right,Lateral Calcaneus Other: - sorbact Secondary Dressing Wound #14 Left,Lateral Lower Leg Dry Gauze Wound #18 Left,Dorsal Foot Dry Gauze Wound #22 Left,Distal Toe Great Dry Gauze Wound #23 Right,Lateral Calcaneus Dry Gauze Edema Control Wound #14 Left,Lateral Lower Leg Kerlix and Coban - Left Lower Extremity - use cotton roll not kerlix Avoid standing for long periods of time Elevate legs to the level of the heart or above for 30 minutes daily and/or when sitting, a frequency of: - throughout the day Wound #18 Left,Dorsal Foot Kerlix and Coban - Left Lower Extremity - use cotton roll not kerlix Avoid standing for long periods of time Elevate legs to the level of the heart or above for 30 minutes daily and/or when sitting, a frequency of: - throughout the day Wound #22 Left,Distal Toe Great Kerlix and Coban - Left Lower Extremity - use cotton roll not kerlix Avoid standing for long periods of time Elevate legs to the level of the heart or above for 30 minutes daily and/or when sitting, a frequency of: - throughout the day Wound #23 Right,Lateral Calcaneus Kerlix and Coban - Left Lower Extremity - use cotton roll not kerlix Avoid standing for long periods of  time Elevate legs to the level of the heart or above for 30 minutes daily and/or when sitting, a frequency of: - throughout the day Electronic Signature(s) Signed: 04/28/2019 5:08:58 PM By: [name]  RN Signed: 04/28/2019 5:30:44 PM By: Linton Ham MD Entered By: [name] on 04/28/2019 11:21:29 -------------------------------------------------------------------------------- Problem List Details Patient Name: Date of Service: [name] 04/28/2019 10:00 AM Medical Record [id number] Patient Account Number: [id number] Date of Birth/Sex: Treating RN: June 24, 1945 (73 y.o. [name] Primary Care Provider: [name], [name] Other Clinician: Referring Provider: Treating Provider/Extender:Ruperto Kiernan [name], [name] in Treatment: 158 Active Problems ICD-10 Evaluated Encounter Code Description Active Date Today Diagnosis L97.221 Non-pressure chronic ulcer of left calf limited to 08/15/2017 No Yes breakdown of skin L97.321 Non-pressure chronic ulcer of left ankle limited to 07/01/2018 No Yes breakdown of skin L97.521 Non-pressure chronic ulcer of other part of left foot 02/17/2019 No Yes limited to breakdown of skin L89.613 Pressure ulcer of right heel, stage 3 04/17/2016 No Yes Inactive Problems ICD-10 Code Description Active Date Inactive Date E11.621 Type 2 diabetes mellitus with foot ulcer 04/17/2016 04/17/2016 L03.032 Cellulitis of left toe 01/06/2019 01/06/2019 I25.119 Atherosclerotic heart disease of native coronary artery with 04/17/2016 04/17/2016 unspecified angina pectoris S51.811D Laceration without foreign body of right forearm, subsequent 10/22/2017 10/22/2017 encounter [id number] Acute diastolic (congestive) heart failure 04/17/2016 04/17/2016 L03.116 Cellulitis of left lower limb 12/24/2017 12/24/2017 [id number] Pressure ulcer of left heel, stage 3 12/04/2016 12/04/2016 L97.821 Non-pressure chronic ulcer of other part of left lower leg limited 01/06/2019  01/06/2019 to breakdown of skin Resolved Problems ICD-10 Code Description Active Date Resolved Date L89.512 Pressure ulcer of right ankle, stage 2 04/17/2016 04/17/2016 L89.522 Pressure ulcer of left ankle, stage 2 04/17/2016 04/17/2016 Electronic Signature(s) Signed: 04/28/2019 5:30:44 PM By: Linton Ham MD Entered By: Linton Ham on 04/28/2019 12:12:35 -------------------------------------------------------------------------------- Progress Note Details Patient Name: Date of Service: [name] 04/28/2019 10:00 AM Medical Record [id number] Patient Account Number: [id number] Date of Birth/Sex: Treating RN: 14-Feb-1946 (74 y.o. M) Primary Care Provider: [name], [name] Other Clinician: Referring Provider: Treating Provider/Extender:Constance Whittle [name], [name] in Treatment: 158 Subjective History of Present Illness (HPI) The following HPI elements were documented for the patient's wound: Location: On the left and right lateral forefoot which has been there for about 6 months Quality: Patient reports No Pain. Severity: Patient states wound(s) are getting worse. Duration: Patient has had the wound for > 6 months prior to seeking treatment at the wound center Context: The wound would happen gradually Modifying Factors: Patient wound(s)/ulcer(s) are worsening due to :continual drainage from the wound Associated Signs and Symptoms: Patient reports having increase discharge. This patient returns after being seen here till the end of August and he was lost to follow-up. he has been quite debilitated laying in bed most of the time and his condition has deteriorated significantly. He has multiple ulcerations on the heel lateral forefoot and some of his toes. ======== Old notes: 74 year old male known to our practice when he was seen here in February and March and was lost to follow-up when he was admitted to hospital with various medical problems including coronary  artery disease and a stroke. Now returns with the problem on the left forefoot where he has an ulceration and this has been there for about 6 months. most recently he was in hospital between July 6 and July 16, when he was admitted and treated for acute respiratory failure is secondary to aspiration pneumonia, large non-STEMI, ischemic cardiomyopathy with systolic and diastolic congestive heart failure with ejection fraction about 15-20%, ventricular tachycardia and has been treated with amiodarone, acute on careful up at the common new acute CVA, acute chronic kidney disease stage III,  RN Signed: 04/28/2019 5:30:44 PM By: Linton Ham MD Entered By: [name] on 04/28/2019 11:21:29 -------------------------------------------------------------------------------- Problem List Details Patient Name: Date of Service: [name] 04/28/2019 10:00 AM Medical Record [id number] Patient Account Number: [id number] Date of Birth/Sex: Treating RN: June 24, 1945 (73 y.o. [name] Primary Care Provider: [name], [name] Other Clinician: Referring Provider: Treating Provider/Extender:Ruperto Kiernan [name], [name] in Treatment: 158 Active Problems ICD-10 Evaluated Encounter Code Description Active Date Today Diagnosis L97.221 Non-pressure chronic ulcer of left calf limited to 08/15/2017 No Yes breakdown of skin L97.321 Non-pressure chronic ulcer of left ankle limited to 07/01/2018 No Yes breakdown of skin L97.521 Non-pressure chronic ulcer of other part of left foot 02/17/2019 No Yes limited to breakdown of skin L89.613 Pressure ulcer of right heel, stage 3 04/17/2016 No Yes Inactive Problems ICD-10 Code Description Active Date Inactive Date E11.621 Type 2 diabetes mellitus with foot ulcer 04/17/2016 04/17/2016 L03.032 Cellulitis of left toe 01/06/2019 01/06/2019 I25.119 Atherosclerotic heart disease of native coronary artery with 04/17/2016 04/17/2016 unspecified angina pectoris S51.811D Laceration without foreign body of right forearm, subsequent 10/22/2017 10/22/2017 encounter [id number] Acute diastolic (congestive) heart failure 04/17/2016 04/17/2016 L03.116 Cellulitis of left lower limb 12/24/2017 12/24/2017 [id number] Pressure ulcer of left heel, stage 3 12/04/2016 12/04/2016 L97.821 Non-pressure chronic ulcer of other part of left lower leg limited 01/06/2019  01/06/2019 to breakdown of skin Resolved Problems ICD-10 Code Description Active Date Resolved Date L89.512 Pressure ulcer of right ankle, stage 2 04/17/2016 04/17/2016 L89.522 Pressure ulcer of left ankle, stage 2 04/17/2016 04/17/2016 Electronic Signature(s) Signed: 04/28/2019 5:30:44 PM By: Linton Ham MD Entered By: Linton Ham on 04/28/2019 12:12:35 -------------------------------------------------------------------------------- Progress Note Details Patient Name: Date of Service: [name] 04/28/2019 10:00 AM Medical Record [id number] Patient Account Number: [id number] Date of Birth/Sex: Treating RN: 14-Feb-1946 (74 y.o. M) Primary Care Provider: [name], [name] Other Clinician: Referring Provider: Treating Provider/Extender:Constance Whittle [name], [name] in Treatment: 158 Subjective History of Present Illness (HPI) The following HPI elements were documented for the patient's wound: Location: On the left and right lateral forefoot which has been there for about 6 months Quality: Patient reports No Pain. Severity: Patient states wound(s) are getting worse. Duration: Patient has had the wound for > 6 months prior to seeking treatment at the wound center Context: The wound would happen gradually Modifying Factors: Patient wound(s)/ulcer(s) are worsening due to :continual drainage from the wound Associated Signs and Symptoms: Patient reports having increase discharge. This patient returns after being seen here till the end of August and he was lost to follow-up. he has been quite debilitated laying in bed most of the time and his condition has deteriorated significantly. He has multiple ulcerations on the heel lateral forefoot and some of his toes. ======== Old notes: 74 year old male known to our practice when he was seen here in February and March and was lost to follow-up when he was admitted to hospital with various medical problems including coronary  artery disease and a stroke. Now returns with the problem on the left forefoot where he has an ulceration and this has been there for about 6 months. most recently he was in hospital between July 6 and July 16, when he was admitted and treated for acute respiratory failure is secondary to aspiration pneumonia, large non-STEMI, ischemic cardiomyopathy with systolic and diastolic congestive heart failure with ejection fraction about 15-20%, ventricular tachycardia and has been treated with amiodarone, acute on careful up at the common new acute CVA, acute chronic kidney disease stage III,  RN Signed: 04/28/2019 5:30:44 PM By: Linton Ham MD Entered By: [name] on 04/28/2019 11:21:29 -------------------------------------------------------------------------------- Problem List Details Patient Name: Date of Service: [name] 04/28/2019 10:00 AM Medical Record [id number] Patient Account Number: [id number] Date of Birth/Sex: Treating RN: June 24, 1945 (73 y.o. [name] Primary Care Provider: [name], [name] Other Clinician: Referring Provider: Treating Provider/Extender:Ruperto Kiernan [name], [name] in Treatment: 158 Active Problems ICD-10 Evaluated Encounter Code Description Active Date Today Diagnosis L97.221 Non-pressure chronic ulcer of left calf limited to 08/15/2017 No Yes breakdown of skin L97.321 Non-pressure chronic ulcer of left ankle limited to 07/01/2018 No Yes breakdown of skin L97.521 Non-pressure chronic ulcer of other part of left foot 02/17/2019 No Yes limited to breakdown of skin L89.613 Pressure ulcer of right heel, stage 3 04/17/2016 No Yes Inactive Problems ICD-10 Code Description Active Date Inactive Date E11.621 Type 2 diabetes mellitus with foot ulcer 04/17/2016 04/17/2016 L03.032 Cellulitis of left toe 01/06/2019 01/06/2019 I25.119 Atherosclerotic heart disease of native coronary artery with 04/17/2016 04/17/2016 unspecified angina pectoris S51.811D Laceration without foreign body of right forearm, subsequent 10/22/2017 10/22/2017 encounter [id number] Acute diastolic (congestive) heart failure 04/17/2016 04/17/2016 L03.116 Cellulitis of left lower limb 12/24/2017 12/24/2017 [id number] Pressure ulcer of left heel, stage 3 12/04/2016 12/04/2016 L97.821 Non-pressure chronic ulcer of other part of left lower leg limited 01/06/2019  01/06/2019 to breakdown of skin Resolved Problems ICD-10 Code Description Active Date Resolved Date L89.512 Pressure ulcer of right ankle, stage 2 04/17/2016 04/17/2016 L89.522 Pressure ulcer of left ankle, stage 2 04/17/2016 04/17/2016 Electronic Signature(s) Signed: 04/28/2019 5:30:44 PM By: Linton Ham MD Entered By: Linton Ham on 04/28/2019 12:12:35 -------------------------------------------------------------------------------- Progress Note Details Patient Name: Date of Service: [name] 04/28/2019 10:00 AM Medical Record [id number] Patient Account Number: [id number] Date of Birth/Sex: Treating RN: 14-Feb-1946 (74 y.o. M) Primary Care Provider: [name], [name] Other Clinician: Referring Provider: Treating Provider/Extender:Constance Whittle [name], [name] in Treatment: 158 Subjective History of Present Illness (HPI) The following HPI elements were documented for the patient's wound: Location: On the left and right lateral forefoot which has been there for about 6 months Quality: Patient reports No Pain. Severity: Patient states wound(s) are getting worse. Duration: Patient has had the wound for > 6 months prior to seeking treatment at the wound center Context: The wound would happen gradually Modifying Factors: Patient wound(s)/ulcer(s) are worsening due to :continual drainage from the wound Associated Signs and Symptoms: Patient reports having increase discharge. This patient returns after being seen here till the end of August and he was lost to follow-up. he has been quite debilitated laying in bed most of the time and his condition has deteriorated significantly. He has multiple ulcerations on the heel lateral forefoot and some of his toes. ======== Old notes: 74 year old male known to our practice when he was seen here in February and March and was lost to follow-up when he was admitted to hospital with various medical problems including coronary  artery disease and a stroke. Now returns with the problem on the left forefoot where he has an ulceration and this has been there for about 6 months. most recently he was in hospital between July 6 and July 16, when he was admitted and treated for acute respiratory failure is secondary to aspiration pneumonia, large non-STEMI, ischemic cardiomyopathy with systolic and diastolic congestive heart failure with ejection fraction about 15-20%, ventricular tachycardia and has been treated with amiodarone, acute on careful up at the common new acute CVA, acute chronic kidney disease stage III,

## 2019-05-05 DIAGNOSIS — N312 Flaccid neuropathic bladder, not elsewhere classified: Secondary | ICD-10-CM | POA: Diagnosis not present

## 2019-05-18 ENCOUNTER — Other Ambulatory Visit: Payer: Self-pay | Admitting: Family Medicine

## 2019-05-18 DIAGNOSIS — E11621 Type 2 diabetes mellitus with foot ulcer: Secondary | ICD-10-CM | POA: Diagnosis not present

## 2019-05-26 ENCOUNTER — Encounter (HOSPITAL_BASED_OUTPATIENT_CLINIC_OR_DEPARTMENT_OTHER): Payer: Medicare HMO | Attending: Internal Medicine | Admitting: Internal Medicine

## 2019-05-26 ENCOUNTER — Other Ambulatory Visit: Payer: Self-pay

## 2019-05-26 DIAGNOSIS — S91102A Unspecified open wound of left great toe without damage to nail, initial encounter: Secondary | ICD-10-CM | POA: Diagnosis not present

## 2019-05-26 DIAGNOSIS — S81802A Unspecified open wound, left lower leg, initial encounter: Secondary | ICD-10-CM | POA: Diagnosis not present

## 2019-05-26 DIAGNOSIS — S91302A Unspecified open wound, left foot, initial encounter: Secondary | ICD-10-CM | POA: Diagnosis not present

## 2019-05-26 DIAGNOSIS — L97412 Non-pressure chronic ulcer of right heel and midfoot with fat layer exposed: Secondary | ICD-10-CM | POA: Diagnosis not present

## 2019-05-26 NOTE — Progress Notes (Signed)
exposed. There is no tunneling or undermining noted. There is a small amount of serosanguineous drainage noted. The wound margin is well defined and not attached to the wound base. There is medium (34-66%) pink granulation within the wound bed. There is a medium (34-66%) amount of necrotic tissue within the wound bed including Adherent Slough. Wound #22 status is Healed - Epithelialized. Original cause of wound was Pressure Injury. The wound is located on the [employer]. The wound measures 0cm length x 0cm width x 0cm depth; 0cm^2 area and 0cm^3 volume. There is no tunneling or undermining noted. There is a none present amount of drainage noted. The wound margin is distinct with the outline attached to the wound base. There is no granulation within the wound bed. There is no necrotic tissue within the wound bed. Wound #23 status is Open. Original cause of wound was Pressure Injury. The wound is located on the Right,Lateral Calcaneus. The wound measures 0.4cm length x 0.6cm width x 0.1cm depth; 0.188cm^2 area and 0.019cm^3 volume. There is Fat Layer (Subcutaneous Tissue) Exposed exposed. There is no tunneling or undermining noted. There is a medium amount of serosanguineous drainage noted. The wound margin is flat and intact. There is large (67-100%) red granulation within the wound bed. There is no necrotic tissue within the wound bed. 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 Dressing Change Frequency: Change dressing three times week. Skin Barriers/Peri-Wound Care: Other: - TCA to left lower extrimity Wound Cleansing: Wound #14  Left,Lateral Lower Leg: Clean wound with Normal Saline. May shower with protection. Wound #18 Left,Dorsal Foot: Clean wound with Normal Saline. May shower with protection. Wound #23 Right,Lateral Calcaneus: Clean wound with Normal Saline. May shower with protection. Primary Wound Dressing: Wound #14 Left,Lateral Lower Leg: Calcium Alginate with Silver Wound #18 Left,Dorsal Foot: Calcium Alginate with Silver Wound #23 Right,Lateral Calcaneus: Calcium Alginate with Silver Secondary Dressing: Wound #14 Left,Lateral Lower Leg: Dry Gauze Wound #18 Left,Dorsal Foot: Dry Gauze Wound #23 Right,Lateral Calcaneus: Dry Gauze Edema Control: Kerlix and Coban - Left Lower Extremity - use cotton roll not kerlix Avoid standing for long periods of time Elevate legs to the level of the heart or above for 30 minutes daily and/or when sitting, a frequency of: - throughout the day 1. Change to silver alginate to all wound areas. 2. I suspect a lot of this is an offloading issue although it is difficult going through this with him to really determine where this is occurring Electronic Signature(s) Signed: 05/26/2019 5:22:39 PM By: Linton Ham MD Entered By: Linton Ham on 05/26/2019 13:00:51 -------------------------------------------------------------------------------- SuperBill Details Patient Name: Date of Service: [name] 05/26/2019 Medical Record [id number] Patient Account Number: [id number] Date of Birth/Sex: Treating RN: 1946-01-28 (74 y.o. [name] Primary Care Provider: [name], [name] Other Clinician: Referring Provider: Treating Provider/Extender:Olly Shiner [name], [name] in Treatment: 162 Diagnosis Coding ICD-10 Codes Code Description L89.613 Pressure ulcer of right heel, stage 3 L97.221 Non-pressure chronic ulcer of left calf limited to breakdown of skin L97.321 Non-pressure chronic ulcer of left ankle limited to breakdown of  skin L97.521 Non-pressure chronic ulcer of other part of left foot limited to breakdown of skin Physician Procedures  [name], [name] Other Clinician: Referring Provider: Treating Provider/Extender:Anahi Belmar,  Carlus Stay [name], [name] in Treatment: [phone] Verbal / Phone Orders: No Diagnosis Coding ICD-10 Coding Code Description L89.613 Pressure ulcer of right heel, stage 3 L97.221 Non-pressure chronic ulcer of left calf limited to breakdown of skin L97.321 Non-pressure chronic ulcer of left ankle limited to breakdown of skin L97.521 Non-pressure chronic ulcer of other part of left foot limited to breakdown of skin Follow-up Appointments Return appointment in 1 month. Dressing Change Frequency Change dressing three times week. Skin Barriers/Peri-Wound Care Other: - TCA to left lower extrimity Wound Cleansing Wound #14 Left,Lateral Lower Leg Clean wound with Normal Saline. May shower with protection. Wound #18 Left,Dorsal Foot Clean wound with Normal Saline. May shower with protection. Wound #23 Right,Lateral Calcaneus Clean wound with Normal Saline. May shower with protection. Primary Wound Dressing Wound #14 Left,Lateral Lower Leg Calcium Alginate with Silver Wound #18 Left,Dorsal Foot Calcium Alginate with Silver Wound #23 Right,Lateral Calcaneus Calcium Alginate with Silver Secondary Dressing Wound #14 Left,Lateral Lower Leg Dry Gauze Wound #18 Left,Dorsal Foot Dry Gauze Wound #23 Right,Lateral Calcaneus Dry Gauze Edema Control Kerlix and Coban - Left Lower Extremity - use cotton roll not kerlix Avoid standing for long periods of time Elevate legs to the level of the heart or above for 30 minutes daily and/or when sitting, a frequency of: - throughout the day Electronic Signature(s) Signed: 05/26/2019 5:22:39 PM By: Linton Ham MD Signed: 05/26/2019 5:23:18 PM By: [name] RN Entered By: [name] on 05/26/2019 11:38:56 -------------------------------------------------------------------------------- Problem List Details Patient Name: Date of Service: [name] 05/26/2019 10:00 AM Medical Record [id number] Patient Account Number: [id number] Date  of Birth/Sex: Treating RN: 1945/11/27 (74 y.o. Staci Acosta, Morey Hummingbird Primary Care Provider: [name], [name] Other Clinician: Referring Provider: Treating Provider/Extender:Clorissa Gruenberg [name], [name] in Treatment: 162 Active Problems ICD-10 Evaluated Encounter Code Description Active Date Today Diagnosis L89.613 Pressure ulcer of right heel, stage 3 04/17/2016 No Yes L97.221 Non-pressure chronic ulcer of left calf limited to 08/15/2017 No Yes breakdown of skin L97.321 Non-pressure chronic ulcer of left ankle limited to 07/01/2018 No Yes breakdown of skin L97.521 Non-pressure chronic ulcer of other part of left foot 02/17/2019 No Yes limited to breakdown of skin Inactive Problems ICD-10 Code Description Active Date Inactive Date E11.621 Type 2 diabetes mellitus with foot ulcer 04/17/2016 04/17/2016 L03.032 Cellulitis of left toe 01/06/2019 01/06/2019 [id number] Pressure ulcer of left heel, stage 3 12/04/2016 12/04/2016 I25.119 Atherosclerotic heart disease of native coronary artery with 04/17/2016 04/17/2016 unspecified angina pectoris L97.821 Non-pressure chronic ulcer of other part of left lower leg limited 01/06/2019 01/06/2019 to breakdown of skin S51.811D Laceration without foreign body of right forearm, subsequent 10/22/2017 10/22/2017 encounter [id number] Acute diastolic (congestive) heart failure 04/17/2016 04/17/2016 L03.116 Cellulitis of left lower limb 12/24/2017 12/24/2017 Resolved Problems ICD-10 Code Description Active Date Resolved Date L89.512 Pressure ulcer of right ankle, stage 2 04/17/2016 04/17/2016 L89.522 Pressure ulcer of left ankle, stage 2 04/17/2016 04/17/2016 Electronic Signature(s) Signed: 05/26/2019 5:22:39 PM By: Linton Ham MD Entered By: Linton Ham on 05/26/2019 12:51:46 -------------------------------------------------------------------------------- Progress Note Details Patient Name: Date of Service: [name] 05/26/2019 10:00 AM Medical Record  [id number] Patient Account Number: [id number] Date of Birth/Sex: Treating RN: 09-Apr-1946 (74 y.o. [name] Primary Care Provider: [name], [name] Other Clinician: Referring Provider: Treating Provider/Extender:Jamell Opfer [name], [name] in Treatment: 162 Subjective History of Present Illness (HPI) The following HPI elements were documented for the patient's wound: Location: On the left and right lateral  [name], [name] Other Clinician: Referring Provider: Treating Provider/Extender:Anahi Belmar,  Carlus Stay [name], [name] in Treatment: [phone] Verbal / Phone Orders: No Diagnosis Coding ICD-10 Coding Code Description L89.613 Pressure ulcer of right heel, stage 3 L97.221 Non-pressure chronic ulcer of left calf limited to breakdown of skin L97.321 Non-pressure chronic ulcer of left ankle limited to breakdown of skin L97.521 Non-pressure chronic ulcer of other part of left foot limited to breakdown of skin Follow-up Appointments Return appointment in 1 month. Dressing Change Frequency Change dressing three times week. Skin Barriers/Peri-Wound Care Other: - TCA to left lower extrimity Wound Cleansing Wound #14 Left,Lateral Lower Leg Clean wound with Normal Saline. May shower with protection. Wound #18 Left,Dorsal Foot Clean wound with Normal Saline. May shower with protection. Wound #23 Right,Lateral Calcaneus Clean wound with Normal Saline. May shower with protection. Primary Wound Dressing Wound #14 Left,Lateral Lower Leg Calcium Alginate with Silver Wound #18 Left,Dorsal Foot Calcium Alginate with Silver Wound #23 Right,Lateral Calcaneus Calcium Alginate with Silver Secondary Dressing Wound #14 Left,Lateral Lower Leg Dry Gauze Wound #18 Left,Dorsal Foot Dry Gauze Wound #23 Right,Lateral Calcaneus Dry Gauze Edema Control Kerlix and Coban - Left Lower Extremity - use cotton roll not kerlix Avoid standing for long periods of time Elevate legs to the level of the heart or above for 30 minutes daily and/or when sitting, a frequency of: - throughout the day Electronic Signature(s) Signed: 05/26/2019 5:22:39 PM By: Linton Ham MD Signed: 05/26/2019 5:23:18 PM By: [name] RN Entered By: [name] on 05/26/2019 11:38:56 -------------------------------------------------------------------------------- Problem List Details Patient Name: Date of Service: [name] 05/26/2019 10:00 AM Medical Record [id number] Patient Account Number: [id number] Date  of Birth/Sex: Treating RN: 1945/11/27 (74 y.o. Staci Acosta, Morey Hummingbird Primary Care Provider: [name], [name] Other Clinician: Referring Provider: Treating Provider/Extender:Clorissa Gruenberg [name], [name] in Treatment: 162 Active Problems ICD-10 Evaluated Encounter Code Description Active Date Today Diagnosis L89.613 Pressure ulcer of right heel, stage 3 04/17/2016 No Yes L97.221 Non-pressure chronic ulcer of left calf limited to 08/15/2017 No Yes breakdown of skin L97.321 Non-pressure chronic ulcer of left ankle limited to 07/01/2018 No Yes breakdown of skin L97.521 Non-pressure chronic ulcer of other part of left foot 02/17/2019 No Yes limited to breakdown of skin Inactive Problems ICD-10 Code Description Active Date Inactive Date E11.621 Type 2 diabetes mellitus with foot ulcer 04/17/2016 04/17/2016 L03.032 Cellulitis of left toe 01/06/2019 01/06/2019 [id number] Pressure ulcer of left heel, stage 3 12/04/2016 12/04/2016 I25.119 Atherosclerotic heart disease of native coronary artery with 04/17/2016 04/17/2016 unspecified angina pectoris L97.821 Non-pressure chronic ulcer of other part of left lower leg limited 01/06/2019 01/06/2019 to breakdown of skin S51.811D Laceration without foreign body of right forearm, subsequent 10/22/2017 10/22/2017 encounter [id number] Acute diastolic (congestive) heart failure 04/17/2016 04/17/2016 L03.116 Cellulitis of left lower limb 12/24/2017 12/24/2017 Resolved Problems ICD-10 Code Description Active Date Resolved Date L89.512 Pressure ulcer of right ankle, stage 2 04/17/2016 04/17/2016 L89.522 Pressure ulcer of left ankle, stage 2 04/17/2016 04/17/2016 Electronic Signature(s) Signed: 05/26/2019 5:22:39 PM By: Linton Ham MD Entered By: Linton Ham on 05/26/2019 12:51:46 -------------------------------------------------------------------------------- Progress Note Details Patient Name: Date of Service: [name] 05/26/2019 10:00 AM Medical Record  [id number] Patient Account Number: [id number] Date of Birth/Sex: Treating RN: 09-Apr-1946 (74 y.o. [name] Primary Care Provider: [name], [name] Other Clinician: Referring Provider: Treating Provider/Extender:Jamell Opfer [name], [name] in Treatment: 162 Subjective History of Present Illness (HPI) The following HPI elements were documented for the patient's wound: Location: On the left and right lateral  exposed. There is no tunneling or undermining noted. There is a small amount of serosanguineous drainage noted. The wound margin is well defined and not attached to the wound base. There is medium (34-66%) pink granulation within the wound bed. There is a medium (34-66%) amount of necrotic tissue within the wound bed including Adherent Slough. Wound #22 status is Healed - Epithelialized. Original cause of wound was Pressure Injury. The wound is located on the [employer]. The wound measures 0cm length x 0cm width x 0cm depth; 0cm^2 area and 0cm^3 volume. There is no tunneling or undermining noted. There is a none present amount of drainage noted. The wound margin is distinct with the outline attached to the wound base. There is no granulation within the wound bed. There is no necrotic tissue within the wound bed. Wound #23 status is Open. Original cause of wound was Pressure Injury. The wound is located on the Right,Lateral Calcaneus. The wound measures 0.4cm length x 0.6cm width x 0.1cm depth; 0.188cm^2 area and 0.019cm^3 volume. There is Fat Layer (Subcutaneous Tissue) Exposed exposed. There is no tunneling or undermining noted. There is a medium amount of serosanguineous drainage noted. The wound margin is flat and intact. There is large (67-100%) red granulation within the wound bed. There is no necrotic tissue within the wound bed. 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 Dressing Change Frequency: Change dressing three times week. Skin Barriers/Peri-Wound Care: Other: - TCA to left lower extrimity Wound Cleansing: Wound #14  Left,Lateral Lower Leg: Clean wound with Normal Saline. May shower with protection. Wound #18 Left,Dorsal Foot: Clean wound with Normal Saline. May shower with protection. Wound #23 Right,Lateral Calcaneus: Clean wound with Normal Saline. May shower with protection. Primary Wound Dressing: Wound #14 Left,Lateral Lower Leg: Calcium Alginate with Silver Wound #18 Left,Dorsal Foot: Calcium Alginate with Silver Wound #23 Right,Lateral Calcaneus: Calcium Alginate with Silver Secondary Dressing: Wound #14 Left,Lateral Lower Leg: Dry Gauze Wound #18 Left,Dorsal Foot: Dry Gauze Wound #23 Right,Lateral Calcaneus: Dry Gauze Edema Control: Kerlix and Coban - Left Lower Extremity - use cotton roll not kerlix Avoid standing for long periods of time Elevate legs to the level of the heart or above for 30 minutes daily and/or when sitting, a frequency of: - throughout the day 1. Change to silver alginate to all wound areas. 2. I suspect a lot of this is an offloading issue although it is difficult going through this with him to really determine where this is occurring Electronic Signature(s) Signed: 05/26/2019 5:22:39 PM By: Linton Ham MD Entered By: Linton Ham on 05/26/2019 13:00:51 -------------------------------------------------------------------------------- SuperBill Details Patient Name: Date of Service: [name] 05/26/2019 Medical Record [id number] Patient Account Number: [id number] Date of Birth/Sex: Treating RN: 1946-01-28 (74 y.o. [name] Primary Care Provider: [name], [name] Other Clinician: Referring Provider: Treating Provider/Extender:Olly Shiner [name], [name] in Treatment: 162 Diagnosis Coding ICD-10 Codes Code Description L89.613 Pressure ulcer of right heel, stage 3 L97.221 Non-pressure chronic ulcer of left calf limited to breakdown of skin L97.321 Non-pressure chronic ulcer of left ankle limited to breakdown of  skin L97.521 Non-pressure chronic ulcer of other part of left foot limited to breakdown of skin Physician Procedures  exposed. There is no tunneling or undermining noted. There is a small amount of serosanguineous drainage noted. The wound margin is well defined and not attached to the wound base. There is medium (34-66%) pink granulation within the wound bed. There is a medium (34-66%) amount of necrotic tissue within the wound bed including Adherent Slough. Wound #22 status is Healed - Epithelialized. Original cause of wound was Pressure Injury. The wound is located on the [employer]. The wound measures 0cm length x 0cm width x 0cm depth; 0cm^2 area and 0cm^3 volume. There is no tunneling or undermining noted. There is a none present amount of drainage noted. The wound margin is distinct with the outline attached to the wound base. There is no granulation within the wound bed. There is no necrotic tissue within the wound bed. Wound #23 status is Open. Original cause of wound was Pressure Injury. The wound is located on the Right,Lateral Calcaneus. The wound measures 0.4cm length x 0.6cm width x 0.1cm depth; 0.188cm^2 area and 0.019cm^3 volume. There is Fat Layer (Subcutaneous Tissue) Exposed exposed. There is no tunneling or undermining noted. There is a medium amount of serosanguineous drainage noted. The wound margin is flat and intact. There is large (67-100%) red granulation within the wound bed. There is no necrotic tissue within the wound bed. 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 Dressing Change Frequency: Change dressing three times week. Skin Barriers/Peri-Wound Care: Other: - TCA to left lower extrimity Wound Cleansing: Wound #14  Left,Lateral Lower Leg: Clean wound with Normal Saline. May shower with protection. Wound #18 Left,Dorsal Foot: Clean wound with Normal Saline. May shower with protection. Wound #23 Right,Lateral Calcaneus: Clean wound with Normal Saline. May shower with protection. Primary Wound Dressing: Wound #14 Left,Lateral Lower Leg: Calcium Alginate with Silver Wound #18 Left,Dorsal Foot: Calcium Alginate with Silver Wound #23 Right,Lateral Calcaneus: Calcium Alginate with Silver Secondary Dressing: Wound #14 Left,Lateral Lower Leg: Dry Gauze Wound #18 Left,Dorsal Foot: Dry Gauze Wound #23 Right,Lateral Calcaneus: Dry Gauze Edema Control: Kerlix and Coban - Left Lower Extremity - use cotton roll not kerlix Avoid standing for long periods of time Elevate legs to the level of the heart or above for 30 minutes daily and/or when sitting, a frequency of: - throughout the day 1. Change to silver alginate to all wound areas. 2. I suspect a lot of this is an offloading issue although it is difficult going through this with him to really determine where this is occurring Electronic Signature(s) Signed: 05/26/2019 5:22:39 PM By: Linton Ham MD Entered By: Linton Ham on 05/26/2019 13:00:51 -------------------------------------------------------------------------------- SuperBill Details Patient Name: Date of Service: [name] 05/26/2019 Medical Record [id number] Patient Account Number: [id number] Date of Birth/Sex: Treating RN: 1946-01-28 (74 y.o. [name] Primary Care Provider: [name], [name] Other Clinician: Referring Provider: Treating Provider/Extender:Olly Shiner [name], [name] in Treatment: 162 Diagnosis Coding ICD-10 Codes Code Description L89.613 Pressure ulcer of right heel, stage 3 L97.221 Non-pressure chronic ulcer of left calf limited to breakdown of skin L97.321 Non-pressure chronic ulcer of left ankle limited to breakdown of  skin L97.521 Non-pressure chronic ulcer of other part of left foot limited to breakdown of skin Physician Procedures  exposed. There is no tunneling or undermining noted. There is a small amount of serosanguineous drainage noted. The wound margin is well defined and not attached to the wound base. There is medium (34-66%) pink granulation within the wound bed. There is a medium (34-66%) amount of necrotic tissue within the wound bed including Adherent Slough. Wound #22 status is Healed - Epithelialized. Original cause of wound was Pressure Injury. The wound is located on the [employer]. The wound measures 0cm length x 0cm width x 0cm depth; 0cm^2 area and 0cm^3 volume. There is no tunneling or undermining noted. There is a none present amount of drainage noted. The wound margin is distinct with the outline attached to the wound base. There is no granulation within the wound bed. There is no necrotic tissue within the wound bed. Wound #23 status is Open. Original cause of wound was Pressure Injury. The wound is located on the Right,Lateral Calcaneus. The wound measures 0.4cm length x 0.6cm width x 0.1cm depth; 0.188cm^2 area and 0.019cm^3 volume. There is Fat Layer (Subcutaneous Tissue) Exposed exposed. There is no tunneling or undermining noted. There is a medium amount of serosanguineous drainage noted. The wound margin is flat and intact. There is large (67-100%) red granulation within the wound bed. There is no necrotic tissue within the wound bed. 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 Dressing Change Frequency: Change dressing three times week. Skin Barriers/Peri-Wound Care: Other: - TCA to left lower extrimity Wound Cleansing: Wound #14  Left,Lateral Lower Leg: Clean wound with Normal Saline. May shower with protection. Wound #18 Left,Dorsal Foot: Clean wound with Normal Saline. May shower with protection. Wound #23 Right,Lateral Calcaneus: Clean wound with Normal Saline. May shower with protection. Primary Wound Dressing: Wound #14 Left,Lateral Lower Leg: Calcium Alginate with Silver Wound #18 Left,Dorsal Foot: Calcium Alginate with Silver Wound #23 Right,Lateral Calcaneus: Calcium Alginate with Silver Secondary Dressing: Wound #14 Left,Lateral Lower Leg: Dry Gauze Wound #18 Left,Dorsal Foot: Dry Gauze Wound #23 Right,Lateral Calcaneus: Dry Gauze Edema Control: Kerlix and Coban - Left Lower Extremity - use cotton roll not kerlix Avoid standing for long periods of time Elevate legs to the level of the heart or above for 30 minutes daily and/or when sitting, a frequency of: - throughout the day 1. Change to silver alginate to all wound areas. 2. I suspect a lot of this is an offloading issue although it is difficult going through this with him to really determine where this is occurring Electronic Signature(s) Signed: 05/26/2019 5:22:39 PM By: Linton Ham MD Entered By: Linton Ham on 05/26/2019 13:00:51 -------------------------------------------------------------------------------- SuperBill Details Patient Name: Date of Service: [name] 05/26/2019 Medical Record [id number] Patient Account Number: [id number] Date of Birth/Sex: Treating RN: 1946-01-28 (74 y.o. [name] Primary Care Provider: [name], [name] Other Clinician: Referring Provider: Treating Provider/Extender:Olly Shiner [name], [name] in Treatment: 162 Diagnosis Coding ICD-10 Codes Code Description L89.613 Pressure ulcer of right heel, stage 3 L97.221 Non-pressure chronic ulcer of left calf limited to breakdown of skin L97.321 Non-pressure chronic ulcer of left ankle limited to breakdown of  skin L97.521 Non-pressure chronic ulcer of other part of left foot limited to breakdown of skin Physician Procedures  exposed. There is no tunneling or undermining noted. There is a small amount of serosanguineous drainage noted. The wound margin is well defined and not attached to the wound base. There is medium (34-66%) pink granulation within the wound bed. There is a medium (34-66%) amount of necrotic tissue within the wound bed including Adherent Slough. Wound #22 status is Healed - Epithelialized. Original cause of wound was Pressure Injury. The wound is located on the [employer]. The wound measures 0cm length x 0cm width x 0cm depth; 0cm^2 area and 0cm^3 volume. There is no tunneling or undermining noted. There is a none present amount of drainage noted. The wound margin is distinct with the outline attached to the wound base. There is no granulation within the wound bed. There is no necrotic tissue within the wound bed. Wound #23 status is Open. Original cause of wound was Pressure Injury. The wound is located on the Right,Lateral Calcaneus. The wound measures 0.4cm length x 0.6cm width x 0.1cm depth; 0.188cm^2 area and 0.019cm^3 volume. There is Fat Layer (Subcutaneous Tissue) Exposed exposed. There is no tunneling or undermining noted. There is a medium amount of serosanguineous drainage noted. The wound margin is flat and intact. There is large (67-100%) red granulation within the wound bed. There is no necrotic tissue within the wound bed. 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 Dressing Change Frequency: Change dressing three times week. Skin Barriers/Peri-Wound Care: Other: - TCA to left lower extrimity Wound Cleansing: Wound #14  Left,Lateral Lower Leg: Clean wound with Normal Saline. May shower with protection. Wound #18 Left,Dorsal Foot: Clean wound with Normal Saline. May shower with protection. Wound #23 Right,Lateral Calcaneus: Clean wound with Normal Saline. May shower with protection. Primary Wound Dressing: Wound #14 Left,Lateral Lower Leg: Calcium Alginate with Silver Wound #18 Left,Dorsal Foot: Calcium Alginate with Silver Wound #23 Right,Lateral Calcaneus: Calcium Alginate with Silver Secondary Dressing: Wound #14 Left,Lateral Lower Leg: Dry Gauze Wound #18 Left,Dorsal Foot: Dry Gauze Wound #23 Right,Lateral Calcaneus: Dry Gauze Edema Control: Kerlix and Coban - Left Lower Extremity - use cotton roll not kerlix Avoid standing for long periods of time Elevate legs to the level of the heart or above for 30 minutes daily and/or when sitting, a frequency of: - throughout the day 1. Change to silver alginate to all wound areas. 2. I suspect a lot of this is an offloading issue although it is difficult going through this with him to really determine where this is occurring Electronic Signature(s) Signed: 05/26/2019 5:22:39 PM By: Linton Ham MD Entered By: Linton Ham on 05/26/2019 13:00:51 -------------------------------------------------------------------------------- SuperBill Details Patient Name: Date of Service: [name] 05/26/2019 Medical Record [id number] Patient Account Number: [id number] Date of Birth/Sex: Treating RN: 1946-01-28 (74 y.o. [name] Primary Care Provider: [name], [name] Other Clinician: Referring Provider: Treating Provider/Extender:Olly Shiner [name], [name] in Treatment: 162 Diagnosis Coding ICD-10 Codes Code Description L89.613 Pressure ulcer of right heel, stage 3 L97.221 Non-pressure chronic ulcer of left calf limited to breakdown of skin L97.321 Non-pressure chronic ulcer of left ankle limited to breakdown of  skin L97.521 Non-pressure chronic ulcer of other part of left foot limited to breakdown of skin Physician Procedures  exposed. There is no tunneling or undermining noted. There is a small amount of serosanguineous drainage noted. The wound margin is well defined and not attached to the wound base. There is medium (34-66%) pink granulation within the wound bed. There is a medium (34-66%) amount of necrotic tissue within the wound bed including Adherent Slough. Wound #22 status is Healed - Epithelialized. Original cause of wound was Pressure Injury. The wound is located on the [employer]. The wound measures 0cm length x 0cm width x 0cm depth; 0cm^2 area and 0cm^3 volume. There is no tunneling or undermining noted. There is a none present amount of drainage noted. The wound margin is distinct with the outline attached to the wound base. There is no granulation within the wound bed. There is no necrotic tissue within the wound bed. Wound #23 status is Open. Original cause of wound was Pressure Injury. The wound is located on the Right,Lateral Calcaneus. The wound measures 0.4cm length x 0.6cm width x 0.1cm depth; 0.188cm^2 area and 0.019cm^3 volume. There is Fat Layer (Subcutaneous Tissue) Exposed exposed. There is no tunneling or undermining noted. There is a medium amount of serosanguineous drainage noted. The wound margin is flat and intact. There is large (67-100%) red granulation within the wound bed. There is no necrotic tissue within the wound bed. 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 Dressing Change Frequency: Change dressing three times week. Skin Barriers/Peri-Wound Care: Other: - TCA to left lower extrimity Wound Cleansing: Wound #14  Left,Lateral Lower Leg: Clean wound with Normal Saline. May shower with protection. Wound #18 Left,Dorsal Foot: Clean wound with Normal Saline. May shower with protection. Wound #23 Right,Lateral Calcaneus: Clean wound with Normal Saline. May shower with protection. Primary Wound Dressing: Wound #14 Left,Lateral Lower Leg: Calcium Alginate with Silver Wound #18 Left,Dorsal Foot: Calcium Alginate with Silver Wound #23 Right,Lateral Calcaneus: Calcium Alginate with Silver Secondary Dressing: Wound #14 Left,Lateral Lower Leg: Dry Gauze Wound #18 Left,Dorsal Foot: Dry Gauze Wound #23 Right,Lateral Calcaneus: Dry Gauze Edema Control: Kerlix and Coban - Left Lower Extremity - use cotton roll not kerlix Avoid standing for long periods of time Elevate legs to the level of the heart or above for 30 minutes daily and/or when sitting, a frequency of: - throughout the day 1. Change to silver alginate to all wound areas. 2. I suspect a lot of this is an offloading issue although it is difficult going through this with him to really determine where this is occurring Electronic Signature(s) Signed: 05/26/2019 5:22:39 PM By: Linton Ham MD Entered By: Linton Ham on 05/26/2019 13:00:51 -------------------------------------------------------------------------------- SuperBill Details Patient Name: Date of Service: [name] 05/26/2019 Medical Record [id number] Patient Account Number: [id number] Date of Birth/Sex: Treating RN: 1946-01-28 (74 y.o. [name] Primary Care Provider: [name], [name] Other Clinician: Referring Provider: Treating Provider/Extender:Olly Shiner [name], [name] in Treatment: 162 Diagnosis Coding ICD-10 Codes Code Description L89.613 Pressure ulcer of right heel, stage 3 L97.221 Non-pressure chronic ulcer of left calf limited to breakdown of skin L97.321 Non-pressure chronic ulcer of left ankle limited to breakdown of  skin L97.521 Non-pressure chronic ulcer of other part of left foot limited to breakdown of skin Physician Procedures  exposed. There is no tunneling or undermining noted. There is a small amount of serosanguineous drainage noted. The wound margin is well defined and not attached to the wound base. There is medium (34-66%) pink granulation within the wound bed. There is a medium (34-66%) amount of necrotic tissue within the wound bed including Adherent Slough. Wound #22 status is Healed - Epithelialized. Original cause of wound was Pressure Injury. The wound is located on the [employer]. The wound measures 0cm length x 0cm width x 0cm depth; 0cm^2 area and 0cm^3 volume. There is no tunneling or undermining noted. There is a none present amount of drainage noted. The wound margin is distinct with the outline attached to the wound base. There is no granulation within the wound bed. There is no necrotic tissue within the wound bed. Wound #23 status is Open. Original cause of wound was Pressure Injury. The wound is located on the Right,Lateral Calcaneus. The wound measures 0.4cm length x 0.6cm width x 0.1cm depth; 0.188cm^2 area and 0.019cm^3 volume. There is Fat Layer (Subcutaneous Tissue) Exposed exposed. There is no tunneling or undermining noted. There is a medium amount of serosanguineous drainage noted. The wound margin is flat and intact. There is large (67-100%) red granulation within the wound bed. There is no necrotic tissue within the wound bed. 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 Dressing Change Frequency: Change dressing three times week. Skin Barriers/Peri-Wound Care: Other: - TCA to left lower extrimity Wound Cleansing: Wound #14  Left,Lateral Lower Leg: Clean wound with Normal Saline. May shower with protection. Wound #18 Left,Dorsal Foot: Clean wound with Normal Saline. May shower with protection. Wound #23 Right,Lateral Calcaneus: Clean wound with Normal Saline. May shower with protection. Primary Wound Dressing: Wound #14 Left,Lateral Lower Leg: Calcium Alginate with Silver Wound #18 Left,Dorsal Foot: Calcium Alginate with Silver Wound #23 Right,Lateral Calcaneus: Calcium Alginate with Silver Secondary Dressing: Wound #14 Left,Lateral Lower Leg: Dry Gauze Wound #18 Left,Dorsal Foot: Dry Gauze Wound #23 Right,Lateral Calcaneus: Dry Gauze Edema Control: Kerlix and Coban - Left Lower Extremity - use cotton roll not kerlix Avoid standing for long periods of time Elevate legs to the level of the heart or above for 30 minutes daily and/or when sitting, a frequency of: - throughout the day 1. Change to silver alginate to all wound areas. 2. I suspect a lot of this is an offloading issue although it is difficult going through this with him to really determine where this is occurring Electronic Signature(s) Signed: 05/26/2019 5:22:39 PM By: Linton Ham MD Entered By: Linton Ham on 05/26/2019 13:00:51 -------------------------------------------------------------------------------- SuperBill Details Patient Name: Date of Service: [name] 05/26/2019 Medical Record [id number] Patient Account Number: [id number] Date of Birth/Sex: Treating RN: 1946-01-28 (74 y.o. [name] Primary Care Provider: [name], [name] Other Clinician: Referring Provider: Treating Provider/Extender:Olly Shiner [name], [name] in Treatment: 162 Diagnosis Coding ICD-10 Codes Code Description L89.613 Pressure ulcer of right heel, stage 3 L97.221 Non-pressure chronic ulcer of left calf limited to breakdown of skin L97.321 Non-pressure chronic ulcer of left ankle limited to breakdown of  skin L97.521 Non-pressure chronic ulcer of other part of left foot limited to breakdown of skin Physician Procedures  exposed. There is no tunneling or undermining noted. There is a small amount of serosanguineous drainage noted. The wound margin is well defined and not attached to the wound base. There is medium (34-66%) pink granulation within the wound bed. There is a medium (34-66%) amount of necrotic tissue within the wound bed including Adherent Slough. Wound #22 status is Healed - Epithelialized. Original cause of wound was Pressure Injury. The wound is located on the [employer]. The wound measures 0cm length x 0cm width x 0cm depth; 0cm^2 area and 0cm^3 volume. There is no tunneling or undermining noted. There is a none present amount of drainage noted. The wound margin is distinct with the outline attached to the wound base. There is no granulation within the wound bed. There is no necrotic tissue within the wound bed. Wound #23 status is Open. Original cause of wound was Pressure Injury. The wound is located on the Right,Lateral Calcaneus. The wound measures 0.4cm length x 0.6cm width x 0.1cm depth; 0.188cm^2 area and 0.019cm^3 volume. There is Fat Layer (Subcutaneous Tissue) Exposed exposed. There is no tunneling or undermining noted. There is a medium amount of serosanguineous drainage noted. The wound margin is flat and intact. There is large (67-100%) red granulation within the wound bed. There is no necrotic tissue within the wound bed. 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 Dressing Change Frequency: Change dressing three times week. Skin Barriers/Peri-Wound Care: Other: - TCA to left lower extrimity Wound Cleansing: Wound #14  Left,Lateral Lower Leg: Clean wound with Normal Saline. May shower with protection. Wound #18 Left,Dorsal Foot: Clean wound with Normal Saline. May shower with protection. Wound #23 Right,Lateral Calcaneus: Clean wound with Normal Saline. May shower with protection. Primary Wound Dressing: Wound #14 Left,Lateral Lower Leg: Calcium Alginate with Silver Wound #18 Left,Dorsal Foot: Calcium Alginate with Silver Wound #23 Right,Lateral Calcaneus: Calcium Alginate with Silver Secondary Dressing: Wound #14 Left,Lateral Lower Leg: Dry Gauze Wound #18 Left,Dorsal Foot: Dry Gauze Wound #23 Right,Lateral Calcaneus: Dry Gauze Edema Control: Kerlix and Coban - Left Lower Extremity - use cotton roll not kerlix Avoid standing for long periods of time Elevate legs to the level of the heart or above for 30 minutes daily and/or when sitting, a frequency of: - throughout the day 1. Change to silver alginate to all wound areas. 2. I suspect a lot of this is an offloading issue although it is difficult going through this with him to really determine where this is occurring Electronic Signature(s) Signed: 05/26/2019 5:22:39 PM By: Linton Ham MD Entered By: Linton Ham on 05/26/2019 13:00:51 -------------------------------------------------------------------------------- SuperBill Details Patient Name: Date of Service: [name] 05/26/2019 Medical Record [id number] Patient Account Number: [id number] Date of Birth/Sex: Treating RN: 1946-01-28 (74 y.o. [name] Primary Care Provider: [name], [name] Other Clinician: Referring Provider: Treating Provider/Extender:Olly Shiner [name], [name] in Treatment: 162 Diagnosis Coding ICD-10 Codes Code Description L89.613 Pressure ulcer of right heel, stage 3 L97.221 Non-pressure chronic ulcer of left calf limited to breakdown of skin L97.321 Non-pressure chronic ulcer of left ankle limited to breakdown of  skin L97.521 Non-pressure chronic ulcer of other part of left foot limited to breakdown of skin Physician Procedures  [name], [name] Other Clinician: Referring Provider: Treating Provider/Extender:Anahi Belmar,  Carlus Stay [name], [name] in Treatment: [phone] Verbal / Phone Orders: No Diagnosis Coding ICD-10 Coding Code Description L89.613 Pressure ulcer of right heel, stage 3 L97.221 Non-pressure chronic ulcer of left calf limited to breakdown of skin L97.321 Non-pressure chronic ulcer of left ankle limited to breakdown of skin L97.521 Non-pressure chronic ulcer of other part of left foot limited to breakdown of skin Follow-up Appointments Return appointment in 1 month. Dressing Change Frequency Change dressing three times week. Skin Barriers/Peri-Wound Care Other: - TCA to left lower extrimity Wound Cleansing Wound #14 Left,Lateral Lower Leg Clean wound with Normal Saline. May shower with protection. Wound #18 Left,Dorsal Foot Clean wound with Normal Saline. May shower with protection. Wound #23 Right,Lateral Calcaneus Clean wound with Normal Saline. May shower with protection. Primary Wound Dressing Wound #14 Left,Lateral Lower Leg Calcium Alginate with Silver Wound #18 Left,Dorsal Foot Calcium Alginate with Silver Wound #23 Right,Lateral Calcaneus Calcium Alginate with Silver Secondary Dressing Wound #14 Left,Lateral Lower Leg Dry Gauze Wound #18 Left,Dorsal Foot Dry Gauze Wound #23 Right,Lateral Calcaneus Dry Gauze Edema Control Kerlix and Coban - Left Lower Extremity - use cotton roll not kerlix Avoid standing for long periods of time Elevate legs to the level of the heart or above for 30 minutes daily and/or when sitting, a frequency of: - throughout the day Electronic Signature(s) Signed: 05/26/2019 5:22:39 PM By: Linton Ham MD Signed: 05/26/2019 5:23:18 PM By: [name] RN Entered By: [name] on 05/26/2019 11:38:56 -------------------------------------------------------------------------------- Problem List Details Patient Name: Date of Service: [name] 05/26/2019 10:00 AM Medical Record [id number] Patient Account Number: [id number] Date  of Birth/Sex: Treating RN: 1945/11/27 (74 y.o. Staci Acosta, Morey Hummingbird Primary Care Provider: [name], [name] Other Clinician: Referring Provider: Treating Provider/Extender:Clorissa Gruenberg [name], [name] in Treatment: 162 Active Problems ICD-10 Evaluated Encounter Code Description Active Date Today Diagnosis L89.613 Pressure ulcer of right heel, stage 3 04/17/2016 No Yes L97.221 Non-pressure chronic ulcer of left calf limited to 08/15/2017 No Yes breakdown of skin L97.321 Non-pressure chronic ulcer of left ankle limited to 07/01/2018 No Yes breakdown of skin L97.521 Non-pressure chronic ulcer of other part of left foot 02/17/2019 No Yes limited to breakdown of skin Inactive Problems ICD-10 Code Description Active Date Inactive Date E11.621 Type 2 diabetes mellitus with foot ulcer 04/17/2016 04/17/2016 L03.032 Cellulitis of left toe 01/06/2019 01/06/2019 [id number] Pressure ulcer of left heel, stage 3 12/04/2016 12/04/2016 I25.119 Atherosclerotic heart disease of native coronary artery with 04/17/2016 04/17/2016 unspecified angina pectoris L97.821 Non-pressure chronic ulcer of other part of left lower leg limited 01/06/2019 01/06/2019 to breakdown of skin S51.811D Laceration without foreign body of right forearm, subsequent 10/22/2017 10/22/2017 encounter [id number] Acute diastolic (congestive) heart failure 04/17/2016 04/17/2016 L03.116 Cellulitis of left lower limb 12/24/2017 12/24/2017 Resolved Problems ICD-10 Code Description Active Date Resolved Date L89.512 Pressure ulcer of right ankle, stage 2 04/17/2016 04/17/2016 L89.522 Pressure ulcer of left ankle, stage 2 04/17/2016 04/17/2016 Electronic Signature(s) Signed: 05/26/2019 5:22:39 PM By: Linton Ham MD Entered By: Linton Ham on 05/26/2019 12:51:46 -------------------------------------------------------------------------------- Progress Note Details Patient Name: Date of Service: [name] 05/26/2019 10:00 AM Medical Record  [id number] Patient Account Number: [id number] Date of Birth/Sex: Treating RN: 09-Apr-1946 (74 y.o. [name] Primary Care Provider: [name], [name] Other Clinician: Referring Provider: Treating Provider/Extender:Jamell Opfer [name], [name] in Treatment: 162 Subjective History of Present Illness (HPI) The following HPI elements were documented for the patient's wound: Location: On the left and right lateral  exposed. There is no tunneling or undermining noted. There is a small amount of serosanguineous drainage noted. The wound margin is well defined and not attached to the wound base. There is medium (34-66%) pink granulation within the wound bed. There is a medium (34-66%) amount of necrotic tissue within the wound bed including Adherent Slough. Wound #22 status is Healed - Epithelialized. Original cause of wound was Pressure Injury. The wound is located on the [employer]. The wound measures 0cm length x 0cm width x 0cm depth; 0cm^2 area and 0cm^3 volume. There is no tunneling or undermining noted. There is a none present amount of drainage noted. The wound margin is distinct with the outline attached to the wound base. There is no granulation within the wound bed. There is no necrotic tissue within the wound bed. Wound #23 status is Open. Original cause of wound was Pressure Injury. The wound is located on the Right,Lateral Calcaneus. The wound measures 0.4cm length x 0.6cm width x 0.1cm depth; 0.188cm^2 area and 0.019cm^3 volume. There is Fat Layer (Subcutaneous Tissue) Exposed exposed. There is no tunneling or undermining noted. There is a medium amount of serosanguineous drainage noted. The wound margin is flat and intact. There is large (67-100%) red granulation within the wound bed. There is no necrotic tissue within the wound bed. 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 Dressing Change Frequency: Change dressing three times week. Skin Barriers/Peri-Wound Care: Other: - TCA to left lower extrimity Wound Cleansing: Wound #14  Left,Lateral Lower Leg: Clean wound with Normal Saline. May shower with protection. Wound #18 Left,Dorsal Foot: Clean wound with Normal Saline. May shower with protection. Wound #23 Right,Lateral Calcaneus: Clean wound with Normal Saline. May shower with protection. Primary Wound Dressing: Wound #14 Left,Lateral Lower Leg: Calcium Alginate with Silver Wound #18 Left,Dorsal Foot: Calcium Alginate with Silver Wound #23 Right,Lateral Calcaneus: Calcium Alginate with Silver Secondary Dressing: Wound #14 Left,Lateral Lower Leg: Dry Gauze Wound #18 Left,Dorsal Foot: Dry Gauze Wound #23 Right,Lateral Calcaneus: Dry Gauze Edema Control: Kerlix and Coban - Left Lower Extremity - use cotton roll not kerlix Avoid standing for long periods of time Elevate legs to the level of the heart or above for 30 minutes daily and/or when sitting, a frequency of: - throughout the day 1. Change to silver alginate to all wound areas. 2. I suspect a lot of this is an offloading issue although it is difficult going through this with him to really determine where this is occurring Electronic Signature(s) Signed: 05/26/2019 5:22:39 PM By: Linton Ham MD Entered By: Linton Ham on 05/26/2019 13:00:51 -------------------------------------------------------------------------------- SuperBill Details Patient Name: Date of Service: [name] 05/26/2019 Medical Record [id number] Patient Account Number: [id number] Date of Birth/Sex: Treating RN: 1946-01-28 (74 y.o. [name] Primary Care Provider: [name], [name] Other Clinician: Referring Provider: Treating Provider/Extender:Olly Shiner [name], [name] in Treatment: 162 Diagnosis Coding ICD-10 Codes Code Description L89.613 Pressure ulcer of right heel, stage 3 L97.221 Non-pressure chronic ulcer of left calf limited to breakdown of skin L97.321 Non-pressure chronic ulcer of left ankle limited to breakdown of  skin L97.521 Non-pressure chronic ulcer of other part of left foot limited to breakdown of skin Physician Procedures  exposed. There is no tunneling or undermining noted. There is a small amount of serosanguineous drainage noted. The wound margin is well defined and not attached to the wound base. There is medium (34-66%) pink granulation within the wound bed. There is a medium (34-66%) amount of necrotic tissue within the wound bed including Adherent Slough. Wound #22 status is Healed - Epithelialized. Original cause of wound was Pressure Injury. The wound is located on the [employer]. The wound measures 0cm length x 0cm width x 0cm depth; 0cm^2 area and 0cm^3 volume. There is no tunneling or undermining noted. There is a none present amount of drainage noted. The wound margin is distinct with the outline attached to the wound base. There is no granulation within the wound bed. There is no necrotic tissue within the wound bed. Wound #23 status is Open. Original cause of wound was Pressure Injury. The wound is located on the Right,Lateral Calcaneus. The wound measures 0.4cm length x 0.6cm width x 0.1cm depth; 0.188cm^2 area and 0.019cm^3 volume. There is Fat Layer (Subcutaneous Tissue) Exposed exposed. There is no tunneling or undermining noted. There is a medium amount of serosanguineous drainage noted. The wound margin is flat and intact. There is large (67-100%) red granulation within the wound bed. There is no necrotic tissue within the wound bed. 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 Dressing Change Frequency: Change dressing three times week. Skin Barriers/Peri-Wound Care: Other: - TCA to left lower extrimity Wound Cleansing: Wound #14  Left,Lateral Lower Leg: Clean wound with Normal Saline. May shower with protection. Wound #18 Left,Dorsal Foot: Clean wound with Normal Saline. May shower with protection. Wound #23 Right,Lateral Calcaneus: Clean wound with Normal Saline. May shower with protection. Primary Wound Dressing: Wound #14 Left,Lateral Lower Leg: Calcium Alginate with Silver Wound #18 Left,Dorsal Foot: Calcium Alginate with Silver Wound #23 Right,Lateral Calcaneus: Calcium Alginate with Silver Secondary Dressing: Wound #14 Left,Lateral Lower Leg: Dry Gauze Wound #18 Left,Dorsal Foot: Dry Gauze Wound #23 Right,Lateral Calcaneus: Dry Gauze Edema Control: Kerlix and Coban - Left Lower Extremity - use cotton roll not kerlix Avoid standing for long periods of time Elevate legs to the level of the heart or above for 30 minutes daily and/or when sitting, a frequency of: - throughout the day 1. Change to silver alginate to all wound areas. 2. I suspect a lot of this is an offloading issue although it is difficult going through this with him to really determine where this is occurring Electronic Signature(s) Signed: 05/26/2019 5:22:39 PM By: Linton Ham MD Entered By: Linton Ham on 05/26/2019 13:00:51 -------------------------------------------------------------------------------- SuperBill Details Patient Name: Date of Service: [name] 05/26/2019 Medical Record [id number] Patient Account Number: [id number] Date of Birth/Sex: Treating RN: 1946-01-28 (74 y.o. [name] Primary Care Provider: [name], [name] Other Clinician: Referring Provider: Treating Provider/Extender:Olly Shiner [name], [name] in Treatment: 162 Diagnosis Coding ICD-10 Codes Code Description L89.613 Pressure ulcer of right heel, stage 3 L97.221 Non-pressure chronic ulcer of left calf limited to breakdown of skin L97.321 Non-pressure chronic ulcer of left ankle limited to breakdown of  skin L97.521 Non-pressure chronic ulcer of other part of left foot limited to breakdown of skin Physician Procedures  exposed. There is no tunneling or undermining noted. There is a small amount of serosanguineous drainage noted. The wound margin is well defined and not attached to the wound base. There is medium (34-66%) pink granulation within the wound bed. There is a medium (34-66%) amount of necrotic tissue within the wound bed including Adherent Slough. Wound #22 status is Healed - Epithelialized. Original cause of wound was Pressure Injury. The wound is located on the [employer]. The wound measures 0cm length x 0cm width x 0cm depth; 0cm^2 area and 0cm^3 volume. There is no tunneling or undermining noted. There is a none present amount of drainage noted. The wound margin is distinct with the outline attached to the wound base. There is no granulation within the wound bed. There is no necrotic tissue within the wound bed. Wound #23 status is Open. Original cause of wound was Pressure Injury. The wound is located on the Right,Lateral Calcaneus. The wound measures 0.4cm length x 0.6cm width x 0.1cm depth; 0.188cm^2 area and 0.019cm^3 volume. There is Fat Layer (Subcutaneous Tissue) Exposed exposed. There is no tunneling or undermining noted. There is a medium amount of serosanguineous drainage noted. The wound margin is flat and intact. There is large (67-100%) red granulation within the wound bed. There is no necrotic tissue within the wound bed. 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 Dressing Change Frequency: Change dressing three times week. Skin Barriers/Peri-Wound Care: Other: - TCA to left lower extrimity Wound Cleansing: Wound #14  Left,Lateral Lower Leg: Clean wound with Normal Saline. May shower with protection. Wound #18 Left,Dorsal Foot: Clean wound with Normal Saline. May shower with protection. Wound #23 Right,Lateral Calcaneus: Clean wound with Normal Saline. May shower with protection. Primary Wound Dressing: Wound #14 Left,Lateral Lower Leg: Calcium Alginate with Silver Wound #18 Left,Dorsal Foot: Calcium Alginate with Silver Wound #23 Right,Lateral Calcaneus: Calcium Alginate with Silver Secondary Dressing: Wound #14 Left,Lateral Lower Leg: Dry Gauze Wound #18 Left,Dorsal Foot: Dry Gauze Wound #23 Right,Lateral Calcaneus: Dry Gauze Edema Control: Kerlix and Coban - Left Lower Extremity - use cotton roll not kerlix Avoid standing for long periods of time Elevate legs to the level of the heart or above for 30 minutes daily and/or when sitting, a frequency of: - throughout the day 1. Change to silver alginate to all wound areas. 2. I suspect a lot of this is an offloading issue although it is difficult going through this with him to really determine where this is occurring Electronic Signature(s) Signed: 05/26/2019 5:22:39 PM By: Linton Ham MD Entered By: Linton Ham on 05/26/2019 13:00:51 -------------------------------------------------------------------------------- SuperBill Details Patient Name: Date of Service: [name] 05/26/2019 Medical Record [id number] Patient Account Number: [id number] Date of Birth/Sex: Treating RN: 1946-01-28 (74 y.o. [name] Primary Care Provider: [name], [name] Other Clinician: Referring Provider: Treating Provider/Extender:Olly Shiner [name], [name] in Treatment: 162 Diagnosis Coding ICD-10 Codes Code Description L89.613 Pressure ulcer of right heel, stage 3 L97.221 Non-pressure chronic ulcer of left calf limited to breakdown of skin L97.321 Non-pressure chronic ulcer of left ankle limited to breakdown of  skin L97.521 Non-pressure chronic ulcer of other part of left foot limited to breakdown of skin Physician Procedures  [name], [name] Other Clinician: Referring Provider: Treating Provider/Extender:Anahi Belmar,  Carlus Stay [name], [name] in Treatment: [phone] Verbal / Phone Orders: No Diagnosis Coding ICD-10 Coding Code Description L89.613 Pressure ulcer of right heel, stage 3 L97.221 Non-pressure chronic ulcer of left calf limited to breakdown of skin L97.321 Non-pressure chronic ulcer of left ankle limited to breakdown of skin L97.521 Non-pressure chronic ulcer of other part of left foot limited to breakdown of skin Follow-up Appointments Return appointment in 1 month. Dressing Change Frequency Change dressing three times week. Skin Barriers/Peri-Wound Care Other: - TCA to left lower extrimity Wound Cleansing Wound #14 Left,Lateral Lower Leg Clean wound with Normal Saline. May shower with protection. Wound #18 Left,Dorsal Foot Clean wound with Normal Saline. May shower with protection. Wound #23 Right,Lateral Calcaneus Clean wound with Normal Saline. May shower with protection. Primary Wound Dressing Wound #14 Left,Lateral Lower Leg Calcium Alginate with Silver Wound #18 Left,Dorsal Foot Calcium Alginate with Silver Wound #23 Right,Lateral Calcaneus Calcium Alginate with Silver Secondary Dressing Wound #14 Left,Lateral Lower Leg Dry Gauze Wound #18 Left,Dorsal Foot Dry Gauze Wound #23 Right,Lateral Calcaneus Dry Gauze Edema Control Kerlix and Coban - Left Lower Extremity - use cotton roll not kerlix Avoid standing for long periods of time Elevate legs to the level of the heart or above for 30 minutes daily and/or when sitting, a frequency of: - throughout the day Electronic Signature(s) Signed: 05/26/2019 5:22:39 PM By: Linton Ham MD Signed: 05/26/2019 5:23:18 PM By: [name] RN Entered By: [name] on 05/26/2019 11:38:56 -------------------------------------------------------------------------------- Problem List Details Patient Name: Date of Service: [name] 05/26/2019 10:00 AM Medical Record [id number] Patient Account Number: [id number] Date  of Birth/Sex: Treating RN: 1945/11/27 (74 y.o. Staci Acosta, Morey Hummingbird Primary Care Provider: [name], [name] Other Clinician: Referring Provider: Treating Provider/Extender:Clorissa Gruenberg [name], [name] in Treatment: 162 Active Problems ICD-10 Evaluated Encounter Code Description Active Date Today Diagnosis L89.613 Pressure ulcer of right heel, stage 3 04/17/2016 No Yes L97.221 Non-pressure chronic ulcer of left calf limited to 08/15/2017 No Yes breakdown of skin L97.321 Non-pressure chronic ulcer of left ankle limited to 07/01/2018 No Yes breakdown of skin L97.521 Non-pressure chronic ulcer of other part of left foot 02/17/2019 No Yes limited to breakdown of skin Inactive Problems ICD-10 Code Description Active Date Inactive Date E11.621 Type 2 diabetes mellitus with foot ulcer 04/17/2016 04/17/2016 L03.032 Cellulitis of left toe 01/06/2019 01/06/2019 [id number] Pressure ulcer of left heel, stage 3 12/04/2016 12/04/2016 I25.119 Atherosclerotic heart disease of native coronary artery with 04/17/2016 04/17/2016 unspecified angina pectoris L97.821 Non-pressure chronic ulcer of other part of left lower leg limited 01/06/2019 01/06/2019 to breakdown of skin S51.811D Laceration without foreign body of right forearm, subsequent 10/22/2017 10/22/2017 encounter [id number] Acute diastolic (congestive) heart failure 04/17/2016 04/17/2016 L03.116 Cellulitis of left lower limb 12/24/2017 12/24/2017 Resolved Problems ICD-10 Code Description Active Date Resolved Date L89.512 Pressure ulcer of right ankle, stage 2 04/17/2016 04/17/2016 L89.522 Pressure ulcer of left ankle, stage 2 04/17/2016 04/17/2016 Electronic Signature(s) Signed: 05/26/2019 5:22:39 PM By: Linton Ham MD Entered By: Linton Ham on 05/26/2019 12:51:46 -------------------------------------------------------------------------------- Progress Note Details Patient Name: Date of Service: [name] 05/26/2019 10:00 AM Medical Record  [id number] Patient Account Number: [id number] Date of Birth/Sex: Treating RN: 09-Apr-1946 (74 y.o. [name] Primary Care Provider: [name], [name] Other Clinician: Referring Provider: Treating Provider/Extender:Jamell Opfer [name], [name] in Treatment: 162 Subjective History of Present Illness (HPI) The following HPI elements were documented for the patient's wound: Location: On the left and right lateral  [name], [name] Other Clinician: Referring Provider: Treating Provider/Extender:Anahi Belmar,  Carlus Stay [name], [name] in Treatment: [phone] Verbal / Phone Orders: No Diagnosis Coding ICD-10 Coding Code Description L89.613 Pressure ulcer of right heel, stage 3 L97.221 Non-pressure chronic ulcer of left calf limited to breakdown of skin L97.321 Non-pressure chronic ulcer of left ankle limited to breakdown of skin L97.521 Non-pressure chronic ulcer of other part of left foot limited to breakdown of skin Follow-up Appointments Return appointment in 1 month. Dressing Change Frequency Change dressing three times week. Skin Barriers/Peri-Wound Care Other: - TCA to left lower extrimity Wound Cleansing Wound #14 Left,Lateral Lower Leg Clean wound with Normal Saline. May shower with protection. Wound #18 Left,Dorsal Foot Clean wound with Normal Saline. May shower with protection. Wound #23 Right,Lateral Calcaneus Clean wound with Normal Saline. May shower with protection. Primary Wound Dressing Wound #14 Left,Lateral Lower Leg Calcium Alginate with Silver Wound #18 Left,Dorsal Foot Calcium Alginate with Silver Wound #23 Right,Lateral Calcaneus Calcium Alginate with Silver Secondary Dressing Wound #14 Left,Lateral Lower Leg Dry Gauze Wound #18 Left,Dorsal Foot Dry Gauze Wound #23 Right,Lateral Calcaneus Dry Gauze Edema Control Kerlix and Coban - Left Lower Extremity - use cotton roll not kerlix Avoid standing for long periods of time Elevate legs to the level of the heart or above for 30 minutes daily and/or when sitting, a frequency of: - throughout the day Electronic Signature(s) Signed: 05/26/2019 5:22:39 PM By: Linton Ham MD Signed: 05/26/2019 5:23:18 PM By: [name] RN Entered By: [name] on 05/26/2019 11:38:56 -------------------------------------------------------------------------------- Problem List Details Patient Name: Date of Service: [name] 05/26/2019 10:00 AM Medical Record [id number] Patient Account Number: [id number] Date  of Birth/Sex: Treating RN: 1945/11/27 (74 y.o. Staci Acosta, Morey Hummingbird Primary Care Provider: [name], [name] Other Clinician: Referring Provider: Treating Provider/Extender:Clorissa Gruenberg [name], [name] in Treatment: 162 Active Problems ICD-10 Evaluated Encounter Code Description Active Date Today Diagnosis L89.613 Pressure ulcer of right heel, stage 3 04/17/2016 No Yes L97.221 Non-pressure chronic ulcer of left calf limited to 08/15/2017 No Yes breakdown of skin L97.321 Non-pressure chronic ulcer of left ankle limited to 07/01/2018 No Yes breakdown of skin L97.521 Non-pressure chronic ulcer of other part of left foot 02/17/2019 No Yes limited to breakdown of skin Inactive Problems ICD-10 Code Description Active Date Inactive Date E11.621 Type 2 diabetes mellitus with foot ulcer 04/17/2016 04/17/2016 L03.032 Cellulitis of left toe 01/06/2019 01/06/2019 [id number] Pressure ulcer of left heel, stage 3 12/04/2016 12/04/2016 I25.119 Atherosclerotic heart disease of native coronary artery with 04/17/2016 04/17/2016 unspecified angina pectoris L97.821 Non-pressure chronic ulcer of other part of left lower leg limited 01/06/2019 01/06/2019 to breakdown of skin S51.811D Laceration without foreign body of right forearm, subsequent 10/22/2017 10/22/2017 encounter [id number] Acute diastolic (congestive) heart failure 04/17/2016 04/17/2016 L03.116 Cellulitis of left lower limb 12/24/2017 12/24/2017 Resolved Problems ICD-10 Code Description Active Date Resolved Date L89.512 Pressure ulcer of right ankle, stage 2 04/17/2016 04/17/2016 L89.522 Pressure ulcer of left ankle, stage 2 04/17/2016 04/17/2016 Electronic Signature(s) Signed: 05/26/2019 5:22:39 PM By: Linton Ham MD Entered By: Linton Ham on 05/26/2019 12:51:46 -------------------------------------------------------------------------------- Progress Note Details Patient Name: Date of Service: [name] 05/26/2019 10:00 AM Medical Record  [id number] Patient Account Number: [id number] Date of Birth/Sex: Treating RN: 09-Apr-1946 (74 y.o. [name] Primary Care Provider: [name], [name] Other Clinician: Referring Provider: Treating Provider/Extender:Jamell Opfer [name], [name] in Treatment: 162 Subjective History of Present Illness (HPI) The following HPI elements were documented for the patient's wound: Location: On the left and right lateral  exposed. There is no tunneling or undermining noted. There is a small amount of serosanguineous drainage noted. The wound margin is well defined and not attached to the wound base. There is medium (34-66%) pink granulation within the wound bed. There is a medium (34-66%) amount of necrotic tissue within the wound bed including Adherent Slough. Wound #22 status is Healed - Epithelialized. Original cause of wound was Pressure Injury. The wound is located on the [employer]. The wound measures 0cm length x 0cm width x 0cm depth; 0cm^2 area and 0cm^3 volume. There is no tunneling or undermining noted. There is a none present amount of drainage noted. The wound margin is distinct with the outline attached to the wound base. There is no granulation within the wound bed. There is no necrotic tissue within the wound bed. Wound #23 status is Open. Original cause of wound was Pressure Injury. The wound is located on the Right,Lateral Calcaneus. The wound measures 0.4cm length x 0.6cm width x 0.1cm depth; 0.188cm^2 area and 0.019cm^3 volume. There is Fat Layer (Subcutaneous Tissue) Exposed exposed. There is no tunneling or undermining noted. There is a medium amount of serosanguineous drainage noted. The wound margin is flat and intact. There is large (67-100%) red granulation within the wound bed. There is no necrotic tissue within the wound bed. 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 Dressing Change Frequency: Change dressing three times week. Skin Barriers/Peri-Wound Care: Other: - TCA to left lower extrimity Wound Cleansing: Wound #14  Left,Lateral Lower Leg: Clean wound with Normal Saline. May shower with protection. Wound #18 Left,Dorsal Foot: Clean wound with Normal Saline. May shower with protection. Wound #23 Right,Lateral Calcaneus: Clean wound with Normal Saline. May shower with protection. Primary Wound Dressing: Wound #14 Left,Lateral Lower Leg: Calcium Alginate with Silver Wound #18 Left,Dorsal Foot: Calcium Alginate with Silver Wound #23 Right,Lateral Calcaneus: Calcium Alginate with Silver Secondary Dressing: Wound #14 Left,Lateral Lower Leg: Dry Gauze Wound #18 Left,Dorsal Foot: Dry Gauze Wound #23 Right,Lateral Calcaneus: Dry Gauze Edema Control: Kerlix and Coban - Left Lower Extremity - use cotton roll not kerlix Avoid standing for long periods of time Elevate legs to the level of the heart or above for 30 minutes daily and/or when sitting, a frequency of: - throughout the day 1. Change to silver alginate to all wound areas. 2. I suspect a lot of this is an offloading issue although it is difficult going through this with him to really determine where this is occurring Electronic Signature(s) Signed: 05/26/2019 5:22:39 PM By: Linton Ham MD Entered By: Linton Ham on 05/26/2019 13:00:51 -------------------------------------------------------------------------------- SuperBill Details Patient Name: Date of Service: [name] 05/26/2019 Medical Record [id number] Patient Account Number: [id number] Date of Birth/Sex: Treating RN: 1946-01-28 (74 y.o. [name] Primary Care Provider: [name], [name] Other Clinician: Referring Provider: Treating Provider/Extender:Olly Shiner [name], [name] in Treatment: 162 Diagnosis Coding ICD-10 Codes Code Description L89.613 Pressure ulcer of right heel, stage 3 L97.221 Non-pressure chronic ulcer of left calf limited to breakdown of skin L97.321 Non-pressure chronic ulcer of left ankle limited to breakdown of  skin L97.521 Non-pressure chronic ulcer of other part of left foot limited to breakdown of skin Physician Procedures

## 2019-05-26 NOTE — Progress Notes (Signed)
Necrotic Quality: Adherent Slough Foul Odor After Cleansing:  No Slough/Fibrino Yes Exposed Structure Fascia Exposed: No Fat Layer (Subcutaneous Tissue) Exposed: Yes Tendon Exposed: No Muscle Exposed: No Joint Exposed: No Bone Exposed: No on in Area: -530.3% on in Volume: -538.7% lization: Medium (34-66%) g: No ing: No Treatment Notes Wound #18 (Left, Dorsal Foot) 1. Cleanse With Wound Cleanser Soap and water 2. Periwound Care TCA Cream 3. Primary Dressing Applied Calcium Alginate Ag 4. Secondary Dressing Dry Gauze 6. Support Layer Applied Kerlix/Coban Notes use cotton instead of kerlix Electronic Signature(s) Signed: 05/26/2019 5:19:10 PM By: [name] [name] EMT/HBOT Signed: 05/26/2019 5:23:18 PM By: [name] Coria RN Entered By: [name] [name] on 05/26/2019 15:02:52 -------------------------------------------------------------------------------- Wound Assessment Details Patient Name: Date of Service: [name] [name] 05/26/2019 10:00 AM [name] [id number] Patient Account Number: [id number] Date of Birth/Sex: Treating RN: [birth year][name] [name] (73 y.o. [name] [name] Primary Care [name] [name]: [name] [name] Other Clinician: Referring [name] [name]: Treating [name] [name] in Treatment: 162 Wound Status Wound Number: 22 Primary Pressure Ulcer Etiology: Wound Location: Left Toe Great - Distal Wound Healed - Epithelialized Wounding Event: Pressure Injury Status: Date Acquired: 02/03/2019 Comorbid Anemia, Sleep Apnea, Arrhythmia, Weeks Of Treatment: 14 History: Congestive Heart Failure, Coronary Artery Clustered Wound: No Disease, Hypertension, Myocardial Infarction, Type II Diabetes, Gout, Neuropathy Photos Wound Measurements Length: (cm) 0 % Reductio Width: (cm) 0 % Reductio Depth: (cm) 0 Epithelial Area: (cm) 0 Tunneling Volume: (cm) 0 Undermini Wound Description Classification: Category/Stage II Foul Odor Wound Margin: Distinct, outline attached Slough/Fi Exudate Amount: None  Present Wound Bed Granulation Amount: None Present (0%) Necrotic Amount: None Present (0%) Fascia Exp Fat Layer Tendon Exp Muscle Exp Joint Expo Bone Expos After Cleansing: No brino No Exposed Structure osed: No (Subcutaneous Tissue) Exposed: No osed: No osed: No sed: No ed: No n in Area: 100% n in Volume: 100% ization: Large (67-100%) : No ng: No Electronic Signature(s) Signed: 05/26/2019 5:19:10 PM By: [name] [name] EMT/HBOT Signed: 05/26/2019 5:23:18 PM By: [name] Coria RN Entered By: [name] [name] on 05/26/2019 15:04:33 -------------------------------------------------------------------------------- Wound Assessment Details Patient Name: Date of Service: [name] [name] 05/26/2019 10:00 AM [name] [id number] Patient Account Number: [id number] Date of Birth/Sex: Treating RN: [name] [name], [name] [name] (73 y.o. [name] [name]) [name] [name] Primary Care [name] [name]: Other Clinician: [name], [name] Referring [name] [name]: Treating [name] Koranda/Extender:[name] [name] in Treatment: 162 Wound Status Wound Number: 23 Primary Diabetic Wound/Ulcer of the Lower Etiology: Extremity Wound Location: Right Calcaneus - Lateral Secondary Pressure Ulcer Wounding Event: Pressure Injury Etiology: Date Acquired: 04/28/2019 Wound Open Weeks Of Treatment: 4 Status: Clustered Wound: No Comorbid Anemia, Sleep Apnea, Arrhythmia, History: Congestive Heart Failure, Coronary Artery Disease, Hypertension, Myocardial Infarction, Type II Diabetes, Gout, Neuropathy Photos Wound Measurements Length: (cm) 0.4 Width: (cm) 0.6 Depth: (cm) 0.1 Area: (cm) 0.188 Volume: (cm) 0.019 Wound Description Classification: Grade 2 Wound Margin: Flat and Intact Exudate Amount: Medium Exudate Type: Serosanguineous Exudate Color: red, brown Wound Bed Granulation Amount: Large (67-100%) Granulation Quality: Red Necrotic Amount: None Present (0%) r Cleansing: No Yes Exposed Structure No utaneous  Tissue) Exposed: Yes No No No No % Reduction in Area: 96.8% % Reduction in Volume: 96.8% Epithelialization: Small (1-33%) Tunneling: No Undermining: No Foul Odor Afte Slough/Fibrino Fascia Exposed: Fat Layer (Subc Tendon Exposed: Muscle Exposed: Joint Exposed: Bone Exposed: Assessment Notes dark purple periwound Treatment Notes Wound #23 (Right, Lateral Calcaneus) 1. Cleanse With Wound Cleanser 3. Primary Dressing Applied Calcium Alginate Ag 4. Secondary Dressing Dry Gauze Roll Gauze Heel Cup 5. Secured  [name] [name] ([id number]) Visit Report for 05/26/2019 Arrival Information Details Patient Name: Date of Service: [name] [name], [name] [name] 05/26/2019 10:00 AM [name] [id number] Patient Account Number: [id number] Date of Birth/Sex: Treating RN: [name] [name] (74 y.o. [name] [name] Primary Care [name] [name]: [name] [name] Other Clinician: Referring [name] [name]: Treating [name] Renda/Extender:[name] [name] in Treatment: 44 Visit Information History Since Last Visit Added or deleted any medications: No Patient Arrived: Wheel Chair Any new allergies or adverse reactions: No Arrival Time: 10:26 Had a fall or experienced change in No Accompanied By: daughter activities of daily living that [name] affect Transfer Assistance: Transfer Board risk of falls: Patient Identification Verified: Yes Signs or symptoms of abuse/neglect since last No Secondary Verification Process Yes visito Completed: Hospitalized since last visit: No Patient Requires Transmission- No Implantable device outside of the clinic excluding No Based Precautions: cellular tissue based products placed in the center Patient Has Alerts: Yes since last visit: Patient Alerts: Patient on Blood Pain Present Now: No Thinner left ABI 1.0; rt ABI 1.09 Electronic Signature(s) Signed: 05/26/2019 5:43:30 PM By: Baruch Gouty RN, BSN Entered By: Baruch Gouty on 05/26/2019 10:46:50 -------------------------------------------------------------------------------- Encounter Discharge Information Details Patient Name: Date of Service: [name] [name]. 05/26/2019 10:00 AM [name] [id number] Patient Account Number: [id number] Date of Birth/Sex: Treating RN: [birth year], [name] [name] (73 y.o. [name] [name] Primary Care Zionna Homewood: [name] [name] Other Clinician: Referring Darol Cush: Treating Jawanza Zambito/Extender:[name] [name] in Treatment: [phone] Encounter Discharge Information  Items Discharge Condition: Stable Ambulatory Status: Wheelchair Discharge Destination: Home Transportation: Private Auto Accompanied By: daughter Schedule Follow-up Appointment: Yes Clinical Summary of Care: Patient Declined Electronic Signature(s) Signed: 05/26/2019 4:57:44 PM By: Kela Millin Entered By: Kela Millin on 05/26/2019 12:50:33 -------------------------------------------------------------------------------- Lower Extremity Assessment Details Patient Name: Date of Service: [name] [name], [name] [name] 05/26/2019 10:00 AM [name] [id number] Patient Account Number: [id number] Date of Birth/Sex: Treating RN: [name] [name], [name] [name] (74 y.o. [name] [name] Primary Care Hermann Dottavio: [name] [name] Other Clinician: Referring Emri Sample: Treating Kole Hilyard/Extender:[name] [name] in Treatment: 162 Edema Assessment Assessed: [Left: No] [Right: No] Edema: [Left: No] [Right: No] Calf Left: Right: Point of Measurement: 36 cm From Medial Instep 22.9 cm 25 cm Ankle Left: Right: Point of Measurement: 11 cm From Medial Instep 17.3 cm 18.6 cm Vascular Assessment Pulses: Dorsalis Pedis Palpable: [Left:No] [Right:No] Electronic Signature(s) Signed: 05/26/2019 5:43:30 PM By: Baruch Gouty RN, BSN Entered By: Baruch Gouty on 05/26/2019 10:48:08 -------------------------------------------------------------------------------- [place] Details Patient Name: Date of Service: [name] [name]. 05/26/2019 10:00 AM [name] [id number] Patient Account Number: [id number] Date of Birth/Sex: Treating RN: [name] [name] (73 y.o. [name] [name] Primary Care Ikeem Cleckler: Other Clinician: [name], [name] Referring Veldon Wager: Treating Braelee Herrle/Extender:[name] [name] in Treatment: 162 Active Inactive Wound/Skin Impairment Nursing Diagnoses: Impaired tissue integrity Goals: Patient/caregiver will verbalize understanding of skin  care regimen Date Initiated: 11/19/2017 Target Resolution Date: 06/12/2019 Goal Status: Active Ulcer/skin breakdown will have a volume reduction of 30% by week 4 Date Initiated: 11/19/2017 Date Inactivated: 12/02/2017 Target Resolution Date: 12/22/2017 Unmet Reason: multipl Goal Status: Unmet comorbidities Interventions: Assess patient/caregiver ability to obtain necessary supplies Assess ulceration(s) every visit Provide education on ulcer and skin care Notes: Electronic Signature(s) Signed: 05/26/2019 5:23:18 PM By: [name] Coria RN Entered By: [name] [name] on 05/26/2019 11:09:28 -------------------------------------------------------------------------------- Pain Assessment Details Patient Name: Date of Service: [name] [name], [name] [name] 05/26/2019 10:00 AM [name] [id number] Patient Account Number: [id number] Date of Birth/Sex: Treating RN: [name] [name] (74 y.o. [name] [name] Primary Care Jillaine Waren: [name] [name] Other Clinician:  [name], [name] ([id number]) Visit Report for 05/26/2019 Arrival Information Details Patient Name: Date of Service: [name] [name], [name] [name] 05/26/2019 10:00 AM [name] [id number] Patient Account Number: [id number] Date of Birth/Sex: Treating RN: [name] [name] (74 y.o. [name] [name] Primary Care [name] [name]: [name] [name] Other Clinician: Referring [name] [name]: Treating [name] Renda/Extender:[name] [name] in Treatment: 44 Visit Information History Since Last Visit Added or deleted any medications: No Patient Arrived: Wheel Chair Any new allergies or adverse reactions: No Arrival Time: 10:26 Had a fall or experienced change in No Accompanied By: daughter activities of daily living that [name] affect Transfer Assistance: Transfer Board risk of falls: Patient Identification Verified: Yes Signs or symptoms of abuse/neglect since last No Secondary Verification Process Yes visito Completed: Hospitalized since last visit: No Patient Requires Transmission- No Implantable device outside of the clinic excluding No Based Precautions: cellular tissue based products placed in the center Patient Has Alerts: Yes since last visit: Patient Alerts: Patient on Blood Pain Present Now: No Thinner left ABI 1.0; rt ABI 1.09 Electronic Signature(s) Signed: 05/26/2019 5:43:30 PM By: Baruch Gouty RN, BSN Entered By: Baruch Gouty on 05/26/2019 10:46:50 -------------------------------------------------------------------------------- Encounter Discharge Information Details Patient Name: Date of Service: [name] [name]. 05/26/2019 10:00 AM [name] [id number] Patient Account Number: [id number] Date of Birth/Sex: Treating RN: Mar [birth year], [name] [name] (73 y.o. [name] [name] Primary Care Zionna Homewood: [name] [name] Other Clinician: Referring Darol Cush: Treating Jawanza Zambito/Extender:[name] [name] in Treatment: [phone] Encounter Discharge Information  Items Discharge Condition: Stable Ambulatory Status: Wheelchair Discharge Destination: Home Transportation: Private Auto Accompanied By: daughter Schedule Follow-up Appointment: Yes Clinical Summary of Care: Patient Declined Electronic Signature(s) Signed: 05/26/2019 4:57:44 PM By: Kela Millin Entered By: Kela Millin on 05/26/2019 12:50:33 -------------------------------------------------------------------------------- Lower Extremity Assessment Details Patient Name: Date of Service: [name] [name], [name] [name] 05/26/2019 10:00 AM [name] [id number] Patient Account Number: [id number] Date of Birth/Sex: Treating RN: [name] [name], [name] [name] (74 y.o. [name] [name] Primary Care Hermann Dottavio: [name] [name] Other Clinician: Referring Emri Sample: Treating Kole Hilyard/Extender:[name] [name] in Treatment: 162 Edema Assessment Assessed: [Left: No] [Right: No] Edema: [Left: No] [Right: No] Calf Left: Right: Point of Measurement: 36 cm From Medial Instep 22.9 cm 25 cm Ankle Left: Right: Point of Measurement: 11 cm From Medial Instep 17.3 cm 18.6 cm Vascular Assessment Pulses: Dorsalis Pedis Palpable: [Left:No] [Right:No] Electronic Signature(s) Signed: 05/26/2019 5:43:30 PM By: Baruch Gouty RN, BSN Entered By: Baruch Gouty on 05/26/2019 10:48:08 -------------------------------------------------------------------------------- [place] Details Patient Name: Date of Service: [name] [name]. 05/26/2019 10:00 AM [name] [id number] Patient Account Number: [id number] Date of Birth/Sex: Treating RN: [name] [name] (73 y.o. [name] [name] Primary Care Ikeem Cleckler: Other Clinician: [name], [name] Referring Veldon Wager: Treating Braelee Herrle/Extender:[name] [name] in Treatment: 162 Active Inactive Wound/Skin Impairment Nursing Diagnoses: Impaired tissue integrity Goals: Patient/caregiver will verbalize understanding of skin  care regimen Date Initiated: 11/19/2017 Target Resolution Date: 06/12/2019 Goal Status: Active Ulcer/skin breakdown will have a volume reduction of 30% by week 4 Date Initiated: 11/19/2017 Date Inactivated: 12/02/2017 Target Resolution Date: 12/22/2017 Unmet Reason: multipl Goal Status: Unmet comorbidities Interventions: Assess patient/caregiver ability to obtain necessary supplies Assess ulceration(s) every visit Provide education on ulcer and skin care Notes: Electronic Signature(s) Signed: 05/26/2019 5:23:18 PM By: [name] Coria RN Entered By: [name] [name] on 05/26/2019 11:09:28 -------------------------------------------------------------------------------- Pain Assessment Details Patient Name: Date of Service: [name] [name], [name] [name] 05/26/2019 10:00 AM [name] [id number] Patient Account Number: [id number] Date of Birth/Sex: Treating RN: [name] [name] (74 y.o. [name] [name] Primary Care Jillaine Waren: [name] [name] Other Clinician:  [name], [name] ([id number]) Visit Report for 05/26/2019 Arrival Information Details Patient Name: Date of Service: [name] [name], [name] [name] 05/26/2019 10:00 AM [name] [id number] Patient Account Number: [id number] Date of Birth/Sex: Treating RN: [name] [name] (74 y.o. [name] [name] Primary Care [name] [name]: [name] [name] Other Clinician: Referring [name] [name]: Treating [name] Renda/Extender:[name] [name] in Treatment: 44 Visit Information History Since Last Visit Added or deleted any medications: No Patient Arrived: Wheel Chair Any new allergies or adverse reactions: No Arrival Time: 10:26 Had a fall or experienced change in No Accompanied By: daughter activities of daily living that [name] affect Transfer Assistance: Transfer Board risk of falls: Patient Identification Verified: Yes Signs or symptoms of abuse/neglect since last No Secondary Verification Process Yes visito Completed: Hospitalized since last visit: No Patient Requires Transmission- No Implantable device outside of the clinic excluding No Based Precautions: cellular tissue based products placed in the center Patient Has Alerts: Yes since last visit: Patient Alerts: Patient on Blood Pain Present Now: No Thinner left ABI 1.0; rt ABI 1.09 Electronic Signature(s) Signed: 05/26/2019 5:43:30 PM By: Baruch Gouty RN, BSN Entered By: Baruch Gouty on 05/26/2019 10:46:50 -------------------------------------------------------------------------------- Encounter Discharge Information Details Patient Name: Date of Service: [name] [name]. 05/26/2019 10:00 AM [name] [id number] Patient Account Number: [id number] Date of Birth/Sex: Treating RN: Mar [birth year], [name] [name] (73 y.o. [name] [name] Primary Care Zionna Homewood: [name] [name] Other Clinician: Referring Darol Cush: Treating Jawanza Zambito/Extender:[name] [name] in Treatment: [phone] Encounter Discharge Information  Items Discharge Condition: Stable Ambulatory Status: Wheelchair Discharge Destination: Home Transportation: Private Auto Accompanied By: daughter Schedule Follow-up Appointment: Yes Clinical Summary of Care: Patient Declined Electronic Signature(s) Signed: 05/26/2019 4:57:44 PM By: Kela Millin Entered By: Kela Millin on 05/26/2019 12:50:33 -------------------------------------------------------------------------------- Lower Extremity Assessment Details Patient Name: Date of Service: [name] [name], [name] [name] 05/26/2019 10:00 AM [name] [id number] Patient Account Number: [id number] Date of Birth/Sex: Treating RN: [name] [name], [name] [name] (74 y.o. [name] [name] Primary Care Hermann Dottavio: [name] [name] Other Clinician: Referring Emri Sample: Treating Kole Hilyard/Extender:[name] [name] in Treatment: 162 Edema Assessment Assessed: [Left: No] [Right: No] Edema: [Left: No] [Right: No] Calf Left: Right: Point of Measurement: 36 cm From Medial Instep 22.9 cm 25 cm Ankle Left: Right: Point of Measurement: 11 cm From Medial Instep 17.3 cm 18.6 cm Vascular Assessment Pulses: Dorsalis Pedis Palpable: [Left:No] [Right:No] Electronic Signature(s) Signed: 05/26/2019 5:43:30 PM By: Baruch Gouty RN, BSN Entered By: Baruch Gouty on 05/26/2019 10:48:08 -------------------------------------------------------------------------------- [place] Details Patient Name: Date of Service: [name] [name]. 05/26/2019 10:00 AM [name] [id number] Patient Account Number: [id number] Date of Birth/Sex: Treating RN: [name] [name] (73 y.o. [name] [name] Primary Care Ikeem Cleckler: Other Clinician: [name], [name] Referring Veldon Wager: Treating Braelee Herrle/Extender:[name] [name] in Treatment: 162 Active Inactive Wound/Skin Impairment Nursing Diagnoses: Impaired tissue integrity Goals: Patient/caregiver will verbalize understanding of skin  care regimen Date Initiated: 11/19/2017 Target Resolution Date: 06/12/2019 Goal Status: Active Ulcer/skin breakdown will have a volume reduction of 30% by week 4 Date Initiated: 11/19/2017 Date Inactivated: 12/02/2017 Target Resolution Date: 12/22/2017 Unmet Reason: multipl Goal Status: Unmet comorbidities Interventions: Assess patient/caregiver ability to obtain necessary supplies Assess ulceration(s) every visit Provide education on ulcer and skin care Notes: Electronic Signature(s) Signed: 05/26/2019 5:23:18 PM By: [name] Coria RN Entered By: [name] [name] on 05/26/2019 11:09:28 -------------------------------------------------------------------------------- Pain Assessment Details Patient Name: Date of Service: [name] [name], [name] [name] 05/26/2019 10:00 AM [name] [id number] Patient Account Number: [id number] Date of Birth/Sex: Treating RN: [name] [name] (74 y.o. [name] [name] Primary Care Jillaine Waren: [name] [name] Other Clinician:

## 2019-06-02 DIAGNOSIS — N312 Flaccid neuropathic bladder, not elsewhere classified: Secondary | ICD-10-CM | POA: Diagnosis not present

## 2019-06-15 ENCOUNTER — Other Ambulatory Visit: Payer: Self-pay

## 2019-06-15 ENCOUNTER — Emergency Department (HOSPITAL_COMMUNITY)
Admission: EM | Admit: 2019-06-15 | Discharge: 2019-06-16 | Disposition: A | Discharge status: Home / Self Care | Payer: Medicare HMO | Attending: Emergency Medicine | Admitting: Emergency Medicine

## 2019-06-15 ENCOUNTER — Emergency Department (HOSPITAL_COMMUNITY): Payer: Medicare HMO

## 2019-06-15 ENCOUNTER — Encounter (HOSPITAL_COMMUNITY): Payer: Self-pay | Admitting: Radiology

## 2019-06-15 DIAGNOSIS — S0990XA Unspecified injury of head, initial encounter: Secondary | ICD-10-CM | POA: Diagnosis not present

## 2019-06-15 DIAGNOSIS — Y999 Unspecified external cause status: Secondary | ICD-10-CM | POA: Insufficient documentation

## 2019-06-15 DIAGNOSIS — S0181XA Laceration without foreign body of other part of head, initial encounter: Secondary | ICD-10-CM | POA: Insufficient documentation

## 2019-06-15 DIAGNOSIS — Y929 Unspecified place or not applicable: Secondary | ICD-10-CM | POA: Insufficient documentation

## 2019-06-15 DIAGNOSIS — S299XXA Unspecified injury of thorax, initial encounter: Secondary | ICD-10-CM | POA: Diagnosis not present

## 2019-06-15 DIAGNOSIS — I2510 Atherosclerotic heart disease of native coronary artery without angina pectoris: Secondary | ICD-10-CM | POA: Diagnosis not present

## 2019-06-15 DIAGNOSIS — Z7901 Long term (current) use of anticoagulants: Secondary | ICD-10-CM | POA: Diagnosis not present

## 2019-06-15 DIAGNOSIS — T1490XA Injury, unspecified, initial encounter: Secondary | ICD-10-CM

## 2019-06-15 DIAGNOSIS — S0101XA Laceration without foreign body of scalp, initial encounter: Secondary | ICD-10-CM | POA: Diagnosis not present

## 2019-06-15 DIAGNOSIS — I10 Essential (primary) hypertension: Secondary | ICD-10-CM | POA: Diagnosis not present

## 2019-06-15 DIAGNOSIS — W19XXXA Unspecified fall, initial encounter: Secondary | ICD-10-CM | POA: Diagnosis not present

## 2019-06-15 DIAGNOSIS — W07XXXA Fall from chair, initial encounter: Secondary | ICD-10-CM | POA: Insufficient documentation

## 2019-06-15 DIAGNOSIS — E1165 Type 2 diabetes mellitus with hyperglycemia: Secondary | ICD-10-CM | POA: Diagnosis not present

## 2019-06-15 DIAGNOSIS — Y939 Activity, unspecified: Secondary | ICD-10-CM | POA: Diagnosis not present

## 2019-06-15 DIAGNOSIS — S3993XA Unspecified injury of pelvis, initial encounter: Secondary | ICD-10-CM | POA: Diagnosis not present

## 2019-06-15 DIAGNOSIS — M4802 Spinal stenosis, cervical region: Secondary | ICD-10-CM | POA: Diagnosis not present

## 2019-06-15 DIAGNOSIS — M47812 Spondylosis without myelopathy or radiculopathy, cervical region: Secondary | ICD-10-CM | POA: Diagnosis not present

## 2019-06-15 DIAGNOSIS — I509 Heart failure, unspecified: Secondary | ICD-10-CM | POA: Insufficient documentation

## 2019-06-15 DIAGNOSIS — M2578 Osteophyte, vertebrae: Secondary | ICD-10-CM | POA: Diagnosis not present

## 2019-06-15 DIAGNOSIS — R1111 Vomiting without nausea: Secondary | ICD-10-CM | POA: Diagnosis not present

## 2019-06-15 HISTORY — DX: Atherosclerotic heart disease of native coronary artery without angina pectoris: I25.10

## 2019-06-15 LAB — SAMPLE TO BLOOD BANK

## 2019-06-15 LAB — CBC
HCT: 46.7 % (ref 39.0–52.0)
Hemoglobin: 14.5 g/dL (ref 13.0–17.0)
MCH: 28.3 pg (ref 26.0–34.0)
MCHC: 31 g/dL (ref 30.0–36.0)
MCV: 91.2 fL (ref 80.0–100.0)
Platelets: 183 10*3/uL (ref 150–400)
RBC: 5.12 MIL/uL (ref 4.22–5.81)
RDW: 12.7 % (ref 11.5–15.5)
WBC: 11.8 10*3/uL — ABNORMAL HIGH (ref 4.0–10.5)
nRBC: 0 % (ref 0.0–0.2)

## 2019-06-15 LAB — COMPREHENSIVE METABOLIC PANEL
ALT: 28 U/L (ref 0–44)
AST: 44 U/L — ABNORMAL HIGH (ref 15–41)
Albumin: 2.9 g/dL — ABNORMAL LOW (ref 3.5–5.0)
Alkaline Phosphatase: 149 U/L — ABNORMAL HIGH (ref 38–126)
Anion gap: 9 (ref 5–15)
BUN: 23 mg/dL (ref 8–23)
CO2: 25 mmol/L (ref 22–32)
Calcium: 8.5 mg/dL — ABNORMAL LOW (ref 8.9–10.3)
Chloride: 105 mmol/L (ref 98–111)
Creatinine, Ser: 2.08 mg/dL — ABNORMAL HIGH (ref 0.61–1.24)
GFR calc Af Amer: 36 mL/min — ABNORMAL LOW (ref >60)
GFR calc non Af Amer: 31 mL/min — ABNORMAL LOW (ref >60)
Glucose, Bld: 417 mg/dL — ABNORMAL HIGH (ref 70–99)
Potassium: 4.7 mmol/L (ref 3.5–5.1)
Sodium: 139 mmol/L (ref 135–145)
Total Bilirubin: 0.9 mg/dL (ref 0.3–1.2)
Total Protein: 6.9 g/dL (ref 6.5–8.1)

## 2019-06-15 LAB — APTT: aPTT: 30 seconds (ref 24–36)

## 2019-06-15 LAB — I-STAT CHEM 8, ED
BUN: 31 mg/dL — ABNORMAL HIGH (ref 8–23)
Calcium, Ion: 1.07 mmol/L — ABNORMAL LOW (ref 1.15–1.40)
Chloride: 104 mmol/L (ref 98–111)
Creatinine, Ser: 1.9 mg/dL — ABNORMAL HIGH (ref 0.61–1.24)
Glucose, Bld: 397 mg/dL — ABNORMAL HIGH (ref 70–99)
HCT: 46 % (ref 39.0–52.0)
Hemoglobin: 15.6 g/dL (ref 13.0–17.0)
Potassium: 5.1 mmol/L (ref 3.5–5.1)
Sodium: 140 mmol/L (ref 135–145)
TCO2: 29 mmol/L (ref 22–32)

## 2019-06-15 LAB — LACTIC ACID, PLASMA: Lactic Acid, Venous: 2.4 mmol/L (ref 0.5–1.9)

## 2019-06-15 LAB — ETHANOL: Alcohol, Ethyl (B): <10 mg/dL (ref <10)

## 2019-06-15 LAB — PROTIME-INR
INR: 1.2 (ref 0.8–1.2)
Prothrombin Time: 15.1 seconds (ref 11.4–15.2)

## 2019-06-15 MED ORDER — LIDOCAINE-EPINEPHRINE (PF) 2 %-1:200000 IJ SOLN
20.0000 mL | Freq: Once | INTRAMUSCULAR | Status: AC
  Administered 2019-06-15: 20 mL
  Filled 2019-06-15: qty 20

## 2019-06-15 NOTE — ED Provider Notes (Signed)
[facility] EMERGENCY DEPARTMENT Provider Note   CSN: [id number] Arrival date & time: 06/15/19  2205     History Chief Complaint  Patient presents with  . Fall    [name] [name] is a 74 y.o. male.  The history is provided by the patient, the EMS personnel and medical records.     The patient is a 74 year old male who presents to the ED as a activated level 2 trauma for fall.  Patient was sitting in his recliner, dropped a napkin and leaned forward to pick it up and fell onto his face on the ground.  No syncope or loss of consciousness.  The patient was made a level 2 trauma as he is on plavix for his CAD.  He has a laceration to his forehead, approximately 4 cm, hemostatic.  He denies any headache, neck pain, lightheadedness, chest pain, shortness of breath, abdominal pain, nausea, vomiting, diarrhea.  Per EMS the patient's airway is intact, breathing intact, circulation intact, disability intact.  Access was not obtained.  No interventions performed.  Symptoms are moderate, symptoms are constant, symptoms are unchanged.     Past Medical History:  Diagnosis Date  . CHF (congestive heart failure) ([place])   . Coronary artery disease     There are no problems to display for this patient.   History reviewed. No pertinent surgical history.     No family history on file.  Social History   Tobacco Use  . Smoking status: Not on file  Substance Use Topics  . Alcohol use: Not on file  . Drug use: Not on file    Home Medications Prior to Admission medications   Not on File    Allergies    Patient has no allergy information on record.  Review of Systems   Review of Systems  Constitutional: Negative for fever.  Respiratory: Negative for cough and shortness of breath.   Cardiovascular: Negative for chest pain.  Gastrointestinal: Negative for abdominal pain.  Musculoskeletal: Negative for back pain and neck pain.  Neurological: Negative for syncope,  light-headedness and headaches.  All other systems reviewed and are negative.   Physical Exam Updated Vital Signs BP 117/82   Pulse 86   Temp 98.3 F (36.8 C) (Temporal)   Resp 17   Ht 6' (1.829 m)   Wt 81.6 kg   SpO2 98%   BMI 24.41 kg/m   Physical Exam Vitals and nursing note reviewed.  Constitutional:      Appearance: He is well-developed. He is not toxic-appearing or diaphoretic.  HENT:     Head: Normocephalic.     Comments: 4 cm laceration to right forehead    Mouth/Throat:     Mouth: Mucous membranes are moist.     Pharynx: Oropharynx is clear.  Eyes:     Conjunctiva/sclera: Conjunctivae normal.     Pupils: Pupils are equal, round, and reactive to light.  Neck:     Comments: No C-spine tenderness, step-off or deformity Cardiovascular:     Rate and Rhythm: Normal rate and regular rhythm.     Pulses: Normal pulses.     Heart sounds: No murmur.  Pulmonary:     Effort: Pulmonary effort is normal. No respiratory distress.     Breath sounds: Normal breath sounds.  Chest:     Chest wall: No tenderness.  Abdominal:     Palpations: Abdomen is soft.     Tenderness: There is no abdominal tenderness.  Musculoskeletal:  General: No tenderness or deformity.     Cervical back: Neck supple.     Comments: No thoracic or lumbar tenderness, step-off or deformity  Skin:    General: Skin is warm and dry.  Neurological:     General: No focal deficit present.     Mental Status: He is alert and oriented to person, place, and time.     Sensory: No sensory deficit.     Motor: No weakness.  Psychiatric:        Mood and Affect: Mood normal.        Behavior: Behavior normal.     ED Results / Procedures / Treatments   Labs (all labs ordered are listed, but only abnormal results are displayed) Labs Reviewed  COMPREHENSIVE METABOLIC PANEL - Abnormal; Notable for the following components:      Result Value   Glucose, Bld 417 (*)    Creatinine, Ser 2.08 (*)    Calcium  8.5 (*)    Albumin 2.9 (*)    AST 44 (*)    Alkaline Phosphatase 149 (*)    GFR calc non Af Amer 31 (*)    GFR calc Af Amer 36 (*)    All other components within normal limits  CBC - Abnormal; Notable for the following components:   WBC 11.8 (*)    All other components within normal limits  LACTIC ACID, PLASMA - Abnormal; Notable for the following components:   Lactic Acid, Venous 2.4 (*)    All other components within normal limits  I-STAT CHEM 8, ED - Abnormal; Notable for the following components:   BUN 31 (*)    Creatinine, Ser 1.90 (*)    Glucose, Bld 397 (*)    Calcium, Ion 1.07 (*)    All other components within normal limits  CDS SEROLOGY  ETHANOL  PROTIME-INR  APTT  I-STAT CHEM 8, ED  SAMPLE TO BLOOD BANK    EKG EKG Interpretation  Date/Time:  Monday June 15 2019 22:17:04 EST Ventricular Rate:  88 PR Interval:    QRS Duration: 126 QT Interval:  420 QTC Calculation: 509 R Axis:   -9 Text Interpretation: Sinus rhythm Borderline prolonged PR interval Left bundle branch block Baseline wander in lead(s) V4 No STEMI Confirmed by Nanda Quinton [phone]) on 06/15/2019 10:21:12 PM   Radiology CT HEAD WO CONTRAST  Result Date: 06/15/2019 CLINICAL DATA:  [place], right frontal scalp laceration EXAM: CT HEAD WITHOUT CONTRAST TECHNIQUE: Contiguous axial images were obtained from the base of the skull through the vertex without intravenous contrast. COMPARISON:  12/16/2015 FINDINGS: Brain: Scattered hypodensities throughout the periventricular and subcortical white matter are compatible with chronic small vessel ischemic changes. Similar changes are seen within the central pons. No acute infarct or hemorrhage. Lateral ventricles and midline structures are otherwise unremarkable. No acute extra-axial fluid collections. No mass effect. Vascular: No hyperdense vessel or unexpected calcification. Skull: There is a right frontal scalp laceration and hematoma. No underlying fractures.  Sinuses/Orbits: No acute finding. Other: None IMPRESSION: 1. Right frontal scalp laceration and hematoma. 2. No acute intracranial process. 3. Extensive chronic small-vessel ischemic changes throughout the white matter and central pons. Electronically Signed   By: Randa Ngo M.D.   On: 06/15/2019 22:43   CT CERVICAL SPINE WO CONTRAST  Result Date: 06/15/2019 CLINICAL DATA:  [place], hit head, right frontal scalp hematoma and laceration EXAM: CT CERVICAL SPINE WITHOUT CONTRAST TECHNIQUE: Multidetector CT imaging of the cervical spine was performed without intravenous contrast. Multiplanar  CT image reconstructions were also generated. COMPARISON:  None. FINDINGS: Alignment: Alignment is anatomic. Skull base and vertebrae: No acute displaced fractures. Soft tissues and spinal canal: No prevertebral fluid or swelling. No visible canal hematoma. Disc levels: There is multilevel cervical spondylosis. At C3/C4, C4/C5, and C5/C6 there is central disc osteophyte complex resulting in at least mild central canal stenosis. Symmetrical neural foraminal narrowing noted at C3/C4 and C4/C5. Upper chest: Airway is patent. Visualized portions of the lung apices are clear. Other: Reconstructed images demonstrate no additional findings. IMPRESSION: 1. Multilevel cervical spondylosis. No acute cervical spine fracture. Electronically Signed   By: Randa Ngo M.D.   On: 06/15/2019 22:46   DG Pelvis Portable  Result Date: 06/15/2019 CLINICAL DATA:  74 year old male with level 2 trauma. EXAM: PORTABLE PELVIS 1-2 VIEWS COMPARISON:  None. FINDINGS: There is a right hip arthroplasty. The arthroplasty components appear intact. No acute fracture or dislocation. Severe osteophyte and heterotopic ossification of the right hip. Moderate arthritic changes of the left hip. The soft tissues are unremarkable. IMPRESSION: No acute fracture or dislocation. Electronically Signed   By: Anner Crete M.D.   On: 06/15/2019 22:25   DG Chest  Port 1 View  Result Date: 06/15/2019 CLINICAL DATA:  [place], hit head, anticoagulated EXAM: PORTABLE CHEST 1 VIEW COMPARISON:  05/12/2018 FINDINGS: Single frontal view of the chest demonstrates stable AICD. Cardiac silhouette is unremarkable. Areas of scarring or atelectasis are again noted at the lung bases. No airspace disease, effusion, or pneumothorax. There are no acute displaced fractures. IMPRESSION: 1. No acute intrathoracic process. Electronically Signed   By: Randa Ngo M.D.   On: 06/15/2019 22:25    Procedures .Marland KitchenLaceration Repair  Date/Time: 06/16/2019 1:49 AM Performed by: Taegan Haider, [name], MD Authorized by: Margette Fast, MD   Consent:    Consent obtained:  Verbal   Consent given by:  Patient   Risks discussed:  Infection, need for additional repair, nerve damage, pain, poor cosmetic result and poor wound healing   Alternatives discussed:  No treatment Anesthesia (see MAR for exact dosages):    Anesthesia method:  Local infiltration   Local anesthetic:  Lidocaine 2% WITH epi Laceration details:    Location:  Face   Face location:  Forehead   Length (cm):  4   Depth (mm):  4 Repair type:    Repair type:  Simple Pre-procedure details:    Preparation:  Patient was prepped and draped in usual sterile fashion Exploration:    Hemostasis achieved with:  Direct pressure   Wound exploration: entire depth of wound probed and visualized     Wound extent: no muscle damage noted, no nerve damage noted, no underlying fracture noted and no vascular damage noted     Contaminated: no   Treatment:    Wound cleansed with: chloraprep.   Amount of cleaning:  Extensive   Irrigation solution:  Sterile saline   Irrigation volume:  1L   Irrigation method:  Syringe Skin repair:    Repair method:  Sutures   Suture size:  5-0   Suture material:  Nylon   Suture technique:  Simple interrupted   Number of sutures:  4 Approximation:    Approximation:  Close Post-procedure details:     Patient tolerance of procedure:  Tolerated well, no immediate complications   (including critical care time)  Medications Ordered in ED Medications  lidocaine-EPINEPHrine (XYLOCAINE W/EPI) 2 %-1:200000 (PF) injection 20 mL (20 mLs Infiltration Given by Other 06/15/19 2250)  ED Course  I have reviewed the triage vital signs and the nursing notes.  Pertinent labs & imaging results that were available during my care of the patient were reviewed by me and considered in my medical decision making (see chart for details).    MDM Rules/Calculators/A&P                      Here as a level 2 trauma for ground-level fall on Plavix  ABCs intact, hemodynamically stable.  Secondary survey with right forehead laceration but no other signs of trauma.  CT head and C-spine without emergent pathology.  Laceration repaired as above, patient's tetanus is up-to-date per patient.  Will follow up in 5 days for suture removal.   Mildly elevated lactic acid and white blood cell count are likely secondary to stress response from trauma.  He is hemodynamically stable and do not suspect shock or sepsis at this time.  His elevated BUN and creatinine are at the patient's baseline when compared to previous.  The patient has a left bundle branch block on his EKG which is unchanged from his previous EKGs which were reviewed in the chart.  The patient is resting comfortably in no distress has no complaints at this time.  Strict return precautions provided, discharged in stable condition, awaiting transport home.   Final Clinical Impression(s) / ED Diagnoses Final diagnoses:  Fall, initial encounter  Facial laceration, initial encounter    Rx / DC Orders ED Discharge Orders    None       Praise Dolecki, [name], MD 06/16/19 0151    Margette Fast, MD 06/16/19 [phone]

## 2019-06-15 NOTE — ED Triage Notes (Signed)
Pt from home. Pt was reaching to grab something from the floor while sitting in recliner; fell on hard wood floor face first, 2-2.5in lac noted to R side of head, bleeding controlled at present.   Pacemaker noted to L chest; hx of MI and CVA per EMS

## 2019-06-15 NOTE — ED Notes (Signed)
Patient Granddaughter [name] asking for an update on patient [phone]

## 2019-06-15 NOTE — ED Notes (Signed)
Update given to [name] - Granddaughter. Aware [facility] en route to get pt.

## 2019-06-16 ENCOUNTER — Encounter (HOSPITAL_COMMUNITY): Payer: Self-pay | Admitting: Internal Medicine

## 2019-06-16 ENCOUNTER — Encounter (HOSPITAL_COMMUNITY): Payer: Self-pay | Admitting: Emergency Medicine

## 2019-06-16 DIAGNOSIS — Z7401 Bed confinement status: Secondary | ICD-10-CM | POA: Diagnosis not present

## 2019-06-16 DIAGNOSIS — W19XXXA Unspecified fall, initial encounter: Secondary | ICD-10-CM | POA: Diagnosis not present

## 2019-06-16 DIAGNOSIS — R29898 Other symptoms and signs involving the musculoskeletal system: Secondary | ICD-10-CM | POA: Diagnosis not present

## 2019-06-16 DIAGNOSIS — M2550 Pain in unspecified joint: Secondary | ICD-10-CM | POA: Diagnosis not present

## 2019-06-16 LAB — CDS SEROLOGY

## 2019-06-16 NOTE — ED Notes (Signed)
[facility] called  

## 2019-06-16 NOTE — Discharge Instructions (Signed)
Get your sutures removed in 5 Days.

## 2019-06-18 ENCOUNTER — Telehealth: Payer: Self-pay | Admitting: Family Medicine

## 2019-06-18 NOTE — Telephone Encounter (Signed)
Spoke with patient's daughter. He is due to have the stiches removed on Saturday. She has been giving him Coricedin for his cold/cough. Advised daughter she should take him to an urgent care they can evaluate cough and remove stiches. Daughter verbalized agreement.

## 2019-06-18 NOTE — Telephone Encounter (Signed)
Pt fell on 2/22 and had to go the the ER for stitches on his forehead and he fractured his nose. He is suppose to get those stitches out five days after but he has a bad cough-coughing up mucus and weak since the fall.  His daughter [name] is concerned that it is settling in his chest and they would like advice on how they should go about removing the stitches when he has a cold?  As well as if there is something they can do for his cough? [name] said they did do chest x-rays at [facility] on 2/22)  [name] can be reached at [phone] to leave a detailed message at this number per [name].

## 2019-06-18 NOTE — Progress Notes (Signed)
10:00 AM Medical Record [id number] Patient Account Number: [id number] Date of Birth/Sex: Treating RN: 1945/07/26 (73 y.o. [name] [name] Primary Care [name] [name]: [name] [name] Other Clinician: Referring [name] [name]: Treating [name] [name]/Extender:[name] [name] in Treatment: 158 Active Problems Location of Pain Severity and Description of Pain Patient Has Paino No Site Locations Pain Management and Medication Current Pain Management: Electronic Signature(s) Signed: 06/18/2019 2:23:51 PM By: [name] Hurst RN, BSN Entered By: [name] [name] on 04/28/2019 10:32:04 -------------------------------------------------------------------------------- Patient/Caregiver Education Details Patient Name: Date of Service: [name], [name] 1/5/2021andnbsp10:00 AM Medical Record [phone] Patient Account Number: [id number] Date of Birth/Gender: Treating RN: 1945/12/30 (73 y.o. [name] [name] Primary Care Physician: [name],  [name] Other Clinician: Referring Physician: Treating Physician/Extender:[name] [name] in Treatment: 158 Education Assessment Education Provided To: Patient Education Topics Provided Wound/Skin Impairment: Methods: Explain/Verbal Responses: State content correctly Electronic Signature(s) Signed: 04/28/2019 5:08:58 PM By: [name] Coria RN Entered By: [name] [name] on 04/28/2019 10:10:28 -------------------------------------------------------------------------------- Wound Assessment Details Patient Name: Date of Service: [name] [name] Record 04/28/2019 10:00 AM Medical Record [id number] Patient Account Number: [id number] Date of Birth/Sex: Treating RN: March 16, 1946 (74 y.o. [name] [name] Primary Care [name] [name]: [name] [name] Other Clinician: Referring [name] [name]: Treating [name] [name]/Extender:[name] [name] in Treatment: 158 Wound Status Wound Number: 14 Primary Diabetic Wound/Ulcer of the Lower Extremity Etiology: Wound Location: Left Lower Leg - Lateral Wound Open Wounding Event: Gradually Appeared Status: Date Acquired: 08/15/2017 Comorbid Anemia, Sleep Apnea, Arrhythmia, Weeks Of Treatment: 88 History: Congestive Heart Failure, Coronary Artery Clustered Wound: Yes Disease, Hypertension, Myocardial Infarction, Type II Diabetes, Gout, Neuropathy Photos Wound Measurements Length: (cm) 3.7 % Reduction in A Width: (cm) 0.7 % Reduction in V Depth: (cm) 0.3 Epithelializatio Clustered Quantity: 2 Tunneling: Area: (cm) 2.034 Undermining: Volume: (cm) 0.61 Wound Description Classification: Grade 2 Foul Odor After Wound Margin: Well defined, not attached Slough/Fibrino Exudate Amount: Medium Exudate Type: Serosanguineous Exudate Color: red, brown Wound Bed Granulation Amount: Medium (34-66%) Granulation Quality: Red, Pink Fascia Exposed: Necrotic Amount: Medium (34-66%) Fat Layer (Subcu Necrotic Quality: Adherent  Slough Tendon Exposed: Muscle Exposed: Joint Exposed: Bone Exposed: Electronic Signature(s) Signed: 04/29/2019 3:43:47 PM By: [name] [name] EMT/HBOT Signed: 06/18/2019 2:23:51 PM By: [name] Hurst RN, BSN Entered By: [name] [name] on 04/28/18 Cleansing: No Yes Exposed Structure No taneous Tissue) Exposed: Yes No No No No 21 09:44:50 rea: -115.9% olume: -548.9% n: Medium (34-66%) No No -------------------------------------------------------------------------------- Wound Assessment Details Patient Name: Date of Service: [name] [name], [name] [name] 04/28/2019 10:00 AM Medical Record [id number] Patient Account Number: [id number] Date of Birth/Sex: Treating RN: 08/13/1945 (74 y.o. [name] [name] Primary Care [name] [name]: [name] [name] Other Clinician: Referring [name] [name]: Treating [name] [name]/Extender:[name] [name] in Treatment: 158 Wound Status Wound Number: 18 Primary Diabetic Wound/Ulcer of the Lower Extremity Etiology: Wound Location: Left Foot - Dorsal Wound Open Wounding Event: Gradually Appeared Status: Date Acquired: 08/26/2018 Comorbid Anemia, Sleep Apnea, Arrhythmia, Weeks Of Treatment: 35 History: Congestive Heart Failure, Coronary Artery Clustered Wound: No Disease, Hypertension, Myocardial Infarction, Type II Diabetes, Gout, Neuropathy Photos Wound Measurements Length: (cm) 4 Width: (cm) 1 Depth: (cm) 0.1 Area: (cm) 3.142 Volume: (cm) 0.314 Wound Description Classification: Grade 2 Wound Margin: Well defined, not attached Exudate Amount: Medium Exudate Type: Serosanguineous Exudate Color: red, brown Wound Bed Granulation Amount: Medium (34-66%) Granulation Quality: Pink Necrotic Amount: Medium (34-66%) Necrotic Quality: Eschar, Adherent Slough ter Cleansing: No no Yes Exposed Structure d: No bcutaneous Tissue) Exposed: Yes osed: No osed: No sed: No ed: No % Reduction in Area: -  [name] [name], [name] [name] ([id number]) Visit Report for 04/28/2019 Arrival Information Details Patient Name: Date of Service: [name] [name], [name] [name] 04/28/2019 10:00 AM Medical Record [id number] Patient Account Number: [id number] Date of Birth/Sex: Treating RN: [birth year] (74 y.o. [name] [name] Primary Care Ia [name]: [name] [name] Other Clinician: Referring [name] [name]: Treating [name] [name]/Extender:[name] [name] in Treatment: 158 Visit Information History Since Last Visit Added or deleted any medications: No Patient Arrived: Wheel Chair Any new allergies or adverse reactions: No Arrival Time: 10:31 Had a fall or experienced change in No Accompanied By: daughter activities of daily living that may affect Transfer Assistance: Manual risk of falls: Patient Identification Verified: Yes Signs or symptoms of abuse/neglect since last No Secondary Verification Process Yes visito Completed: Hospitalized since last visit: No Patient Requires Transmission- No Implantable device outside of the clinic excluding No Based Precautions: cellular tissue based products placed in the center Patient Has Alerts: Yes Patient on Blood since last visit: Patient Alerts: Has Dressing in Place as Prescribed: Yes Thinner left ABI 1.0; rt ABI Has Compression in Place as Prescribed: Yes Pain Present Now: No 1.09 Electronic Signature(s) Signed: 06/18/2019 2:23:51 PM By: [name] Hurst RN, BSN Entered By: [name] [name] on 04/28/2019 10:31:47 -------------------------------------------------------------------------------- Encounter Discharge Information Details Patient Name: Date of Service: [name] [name] Record. 04/28/2019 10:00 AM Medical Record [id number] Patient Account Number: [id number] Date of Birth/Sex: Treating RN: 05/11/1945 (73 y.o. [name] [name] Primary Care Deanna Wiater: [name] [name] Other Clinician: Referring Cleora Karnik: Treating Kourtland Coopman/Extender:[name],  [name] [name], Cala [name] in Treatment: [phone] Encounter Discharge Information Items Post Procedure Vitals Discharge Condition: Stable Temperature (F): 98.4 Ambulatory Status: Wheelchair Pulse (bpm): 76 Discharge Destination: Home Respiratory Rate (breaths/min): 16 Transportation: Private Auto Blood Pressure (mmHg): 98/56 Accompanied By: daughter Schedule Follow-up Appointment: Yes Clinical Summary of Care: Patient Declined Electronic Signature(s) Signed: 04/28/2019 5:45:44 PM By: Kela Millin Entered By: Kela Millin on 04/28/2019 12:01:35 -------------------------------------------------------------------------------- Lower Extremity Assessment Details Patient Name: Date of Service: [name] [name] Record. 04/28/2019 10:00 AM Medical Record [id number] Patient Account Number: [id number] Date of Birth/Sex: Treating RN: 1945-04-29 (74 y.o. [name] [name] Primary Care Ayauna Mcnay: [name] [name] Other Clinician: Referring Alexee Delsanto: Treating Suzy Kugel/Extender:[name] [name] in Treatment: 158 Edema Assessment Assessed: [Left: No] [Right: No] Edema: [Left: N] [Right: o] Calf Left: Right: Point of Measurement: 36 cm From Medial Instep 22 cm cm Ankle Left: Right: Point of Measurement: 11 cm From Medial Instep 18 cm cm Vascular Assessment Pulses: Dorsalis Pedis Palpable: [Left:Yes] Electronic Signature(s) Signed: 06/18/2019 2:23:51 PM By: [name] Hurst RN, BSN Entered By: [name] [name] on 04/28/2019 10:35:51 -------------------------------------------------------------------------------- [place] Details Patient Name: Date of Service: [name] [name] Record. 04/28/2019 10:00 AM Medical Record [id number] Patient Account Number: [id number] Date of Birth/Sex: Treating RN: Jan 21, 1946 (73 y.o. [name] [name] Primary Care Bufford Helms: Other Clinician: [name], [name] Referring Maurie Olesen: Treating Yaacov Koziol/Extender:[name],  [name] [name], Cala [name] in Treatment: 158 Active Inactive Wound/Skin Impairment Nursing Diagnoses: Impaired tissue integrity Goals: Patient/caregiver will verbalize understanding of skin care regimen Date Initiated: 11/19/2017 Target Resolution Date: 05/08/2019 Goal Status: Active Ulcer/skin breakdown will have a volume reduction of 30% by week 4 Date Initiated: 11/19/2017 Date Inactivated: 12/02/2017 Target Resolution Date: 12/22/2017 Unmet Reason: multipl Goal Status: Unmet comorbidities Interventions: Assess patient/caregiver ability to obtain necessary supplies Assess ulceration(s) every visit Provide education on ulcer and skin care Notes: Electronic Signature(s) Signed: 04/28/2019 5:08:58 PM By: [name] Coria RN Entered By: [name] [name] on 04/28/2019 10:10:08 -------------------------------------------------------------------------------- Pain Assessment Details Patient Name: Date of Service: [name] [name] Record 04/28/2019  10:00 AM Medical Record [id number] Patient Account Number: [id number] Date of Birth/Sex: Treating RN: 1945/07/26 (73 y.o. [name] [name] Primary Care [name] [name]: [name] [name] Other Clinician: Referring [name] [name]: Treating [name] [name]/Extender:[name] [name] in Treatment: 158 Active Problems Location of Pain Severity and Description of Pain Patient Has Paino No Site Locations Pain Management and Medication Current Pain Management: Electronic Signature(s) Signed: 06/18/2019 2:23:51 PM By: [name] Hurst RN, BSN Entered By: [name] [name] on 04/28/2019 10:32:04 -------------------------------------------------------------------------------- Patient/Caregiver Education Details Patient Name: Date of Service: [name], [name] 1/5/2021andnbsp10:00 AM Medical Record [phone] Patient Account Number: [id number] Date of Birth/Gender: Treating RN: 1945/12/30 (73 y.o. [name] [name] Primary Care Physician: [name],  [name] Other Clinician: Referring Physician: Treating Physician/Extender:[name] [name] in Treatment: 158 Education Assessment Education Provided To: Patient Education Topics Provided Wound/Skin Impairment: Methods: Explain/Verbal Responses: State content correctly Electronic Signature(s) Signed: 04/28/2019 5:08:58 PM By: [name] Coria RN Entered By: [name] [name] on 04/28/2019 10:10:28 -------------------------------------------------------------------------------- Wound Assessment Details Patient Name: Date of Service: [name] [name] Record 04/28/2019 10:00 AM Medical Record [id number] Patient Account Number: [id number] Date of Birth/Sex: Treating RN: March 16, 1946 (74 y.o. [name] [name] Primary Care [name] [name]: [name] [name] Other Clinician: Referring [name] [name]: Treating [name] [name]/Extender:[name] [name] in Treatment: 158 Wound Status Wound Number: 14 Primary Diabetic Wound/Ulcer of the Lower Extremity Etiology: Wound Location: Left Lower Leg - Lateral Wound Open Wounding Event: Gradually Appeared Status: Date Acquired: 08/15/2017 Comorbid Anemia, Sleep Apnea, Arrhythmia, Weeks Of Treatment: 88 History: Congestive Heart Failure, Coronary Artery Clustered Wound: Yes Disease, Hypertension, Myocardial Infarction, Type II Diabetes, Gout, Neuropathy Photos Wound Measurements Length: (cm) 3.7 % Reduction in A Width: (cm) 0.7 % Reduction in V Depth: (cm) 0.3 Epithelializatio Clustered Quantity: 2 Tunneling: Area: (cm) 2.034 Undermining: Volume: (cm) 0.61 Wound Description Classification: Grade 2 Foul Odor After Wound Margin: Well defined, not attached Slough/Fibrino Exudate Amount: Medium Exudate Type: Serosanguineous Exudate Color: red, brown Wound Bed Granulation Amount: Medium (34-66%) Granulation Quality: Red, Pink Fascia Exposed: Necrotic Amount: Medium (34-66%) Fat Layer (Subcu Necrotic Quality: Adherent  Slough Tendon Exposed: Muscle Exposed: Joint Exposed: Bone Exposed: Electronic Signature(s) Signed: 04/29/2019 3:43:47 PM By: [name] [name] EMT/HBOT Signed: 06/18/2019 2:23:51 PM By: [name] Hurst RN, BSN Entered By: [name] [name] on 04/28/18 Cleansing: No Yes Exposed Structure No taneous Tissue) Exposed: Yes No No No No 21 09:44:50 rea: -115.9% olume: -548.9% n: Medium (34-66%) No No -------------------------------------------------------------------------------- Wound Assessment Details Patient Name: Date of Service: [name] [name], [name] [name] 04/28/2019 10:00 AM Medical Record [id number] Patient Account Number: [id number] Date of Birth/Sex: Treating RN: 08/13/1945 (74 y.o. [name] [name] Primary Care [name] [name]: [name] [name] Other Clinician: Referring [name] [name]: Treating [name] [name]/Extender:[name] [name] in Treatment: 158 Wound Status Wound Number: 18 Primary Diabetic Wound/Ulcer of the Lower Extremity Etiology: Wound Location: Left Foot - Dorsal Wound Open Wounding Event: Gradually Appeared Status: Date Acquired: 08/26/2018 Comorbid Anemia, Sleep Apnea, Arrhythmia, Weeks Of Treatment: 35 History: Congestive Heart Failure, Coronary Artery Clustered Wound: No Disease, Hypertension, Myocardial Infarction, Type II Diabetes, Gout, Neuropathy Photos Wound Measurements Length: (cm) 4 Width: (cm) 1 Depth: (cm) 0.1 Area: (cm) 3.142 Volume: (cm) 0.314 Wound Description Classification: Grade 2 Wound Margin: Well defined, not attached Exudate Amount: Medium Exudate Type: Serosanguineous Exudate Color: red, brown Wound Bed Granulation Amount: Medium (34-66%) Granulation Quality: Pink Necrotic Amount: Medium (34-66%) Necrotic Quality: Eschar, Adherent Slough ter Cleansing: No no Yes Exposed Structure d: No bcutaneous Tissue) Exposed: Yes osed: No osed: No sed: No ed: No % Reduction in Area: -  10:00 AM Medical Record [id number] Patient Account Number: [id number] Date of Birth/Sex: Treating RN: 1945/07/26 (73 y.o. [name] [name] Primary Care [name] [name]: [name] [name] Other Clinician: Referring [name] [name]: Treating [name] [name]/Extender:[name] [name] in Treatment: 158 Active Problems Location of Pain Severity and Description of Pain Patient Has Paino No Site Locations Pain Management and Medication Current Pain Management: Electronic Signature(s) Signed: 06/18/2019 2:23:51 PM By: [name] Hurst RN, BSN Entered By: [name] [name] on 04/28/2019 10:32:04 -------------------------------------------------------------------------------- Patient/Caregiver Education Details Patient Name: Date of Service: [name], [name] 1/5/2021andnbsp10:00 AM Medical Record [phone] Patient Account Number: [id number] Date of Birth/Gender: Treating RN: 1945/12/30 (73 y.o. [name] [name] Primary Care Physician: [name],  [name] Other Clinician: Referring Physician: Treating Physician/Extender:[name] [name] in Treatment: 158 Education Assessment Education Provided To: Patient Education Topics Provided Wound/Skin Impairment: Methods: Explain/Verbal Responses: State content correctly Electronic Signature(s) Signed: 04/28/2019 5:08:58 PM By: [name] Coria RN Entered By: [name] [name] on 04/28/2019 10:10:28 -------------------------------------------------------------------------------- Wound Assessment Details Patient Name: Date of Service: [name] [name] Record 04/28/2019 10:00 AM Medical Record [id number] Patient Account Number: [id number] Date of Birth/Sex: Treating RN: March 16, 1946 (74 y.o. [name] [name] Primary Care [name] [name]: [name] [name] Other Clinician: Referring [name] [name]: Treating [name] [name]/Extender:[name] [name] in Treatment: 158 Wound Status Wound Number: 14 Primary Diabetic Wound/Ulcer of the Lower Extremity Etiology: Wound Location: Left Lower Leg - Lateral Wound Open Wounding Event: Gradually Appeared Status: Date Acquired: 08/15/2017 Comorbid Anemia, Sleep Apnea, Arrhythmia, Weeks Of Treatment: 88 History: Congestive Heart Failure, Coronary Artery Clustered Wound: Yes Disease, Hypertension, Myocardial Infarction, Type II Diabetes, Gout, Neuropathy Photos Wound Measurements Length: (cm) 3.7 % Reduction in A Width: (cm) 0.7 % Reduction in V Depth: (cm) 0.3 Epithelializatio Clustered Quantity: 2 Tunneling: Area: (cm) 2.034 Undermining: Volume: (cm) 0.61 Wound Description Classification: Grade 2 Foul Odor After Wound Margin: Well defined, not attached Slough/Fibrino Exudate Amount: Medium Exudate Type: Serosanguineous Exudate Color: red, brown Wound Bed Granulation Amount: Medium (34-66%) Granulation Quality: Red, Pink Fascia Exposed: Necrotic Amount: Medium (34-66%) Fat Layer (Subcu Necrotic Quality: Adherent  Slough Tendon Exposed: Muscle Exposed: Joint Exposed: Bone Exposed: Electronic Signature(s) Signed: 04/29/2019 3:43:47 PM By: [name] [name] EMT/HBOT Signed: 06/18/2019 2:23:51 PM By: [name] Hurst RN, BSN Entered By: [name] [name] on 04/28/18 Cleansing: No Yes Exposed Structure No taneous Tissue) Exposed: Yes No No No No 21 09:44:50 rea: -115.9% olume: -548.9% n: Medium (34-66%) No No -------------------------------------------------------------------------------- Wound Assessment Details Patient Name: Date of Service: [name] [name], [name] [name] 04/28/2019 10:00 AM Medical Record [id number] Patient Account Number: [id number] Date of Birth/Sex: Treating RN: 08/13/1945 (74 y.o. [name] [name] Primary Care [name] [name]: [name] [name] Other Clinician: Referring [name] [name]: Treating [name] [name]/Extender:[name] [name] in Treatment: 158 Wound Status Wound Number: 18 Primary Diabetic Wound/Ulcer of the Lower Extremity Etiology: Wound Location: Left Foot - Dorsal Wound Open Wounding Event: Gradually Appeared Status: Date Acquired: 08/26/2018 Comorbid Anemia, Sleep Apnea, Arrhythmia, Weeks Of Treatment: 35 History: Congestive Heart Failure, Coronary Artery Clustered Wound: No Disease, Hypertension, Myocardial Infarction, Type II Diabetes, Gout, Neuropathy Photos Wound Measurements Length: (cm) 4 Width: (cm) 1 Depth: (cm) 0.1 Area: (cm) 3.142 Volume: (cm) 0.314 Wound Description Classification: Grade 2 Wound Margin: Well defined, not attached Exudate Amount: Medium Exudate Type: Serosanguineous Exudate Color: red, brown Wound Bed Granulation Amount: Medium (34-66%) Granulation Quality: Pink Necrotic Amount: Medium (34-66%) Necrotic Quality: Eschar, Adherent Slough ter Cleansing: No no Yes Exposed Structure d: No bcutaneous Tissue) Exposed: Yes osed: No osed: No sed: No ed: No % Reduction in Area: -

## 2019-06-23 ENCOUNTER — Encounter (HOSPITAL_BASED_OUTPATIENT_CLINIC_OR_DEPARTMENT_OTHER): Payer: Medicare HMO | Admitting: Internal Medicine

## 2019-06-23 NOTE — Telephone Encounter (Signed)
Pt daughter [name] called to inform that pt insurance [employer] is requesting an prior authorization  from pt dr to have home nurse go to pt home to remove his stiches. Home nurse is needed because Urgent care will not see pt due to his cold and cough.   Patient Daughter Phone: [phone]

## 2019-06-24 ENCOUNTER — Encounter: Payer: Self-pay | Admitting: Family Medicine

## 2019-06-24 ENCOUNTER — Ambulatory Visit (INDEPENDENT_AMBULATORY_CARE_PROVIDER_SITE_OTHER): Payer: Medicare HMO | Admitting: Family Medicine

## 2019-06-24 ENCOUNTER — Telehealth: Payer: Self-pay | Admitting: Family Medicine

## 2019-06-24 ENCOUNTER — Other Ambulatory Visit: Payer: Self-pay

## 2019-06-24 VITALS — BP 124/70 | HR 59 | Resp 16 | Ht 72.0 in

## 2019-06-24 DIAGNOSIS — I130 Hypertensive heart and chronic kidney disease with heart failure and stage 1 through stage 4 chronic kidney disease, or unspecified chronic kidney disease: Secondary | ICD-10-CM

## 2019-06-24 DIAGNOSIS — I5042 Chronic combined systolic (congestive) and diastolic (congestive) heart failure: Secondary | ICD-10-CM | POA: Diagnosis not present

## 2019-06-24 DIAGNOSIS — S060X0D Concussion without loss of consciousness, subsequent encounter: Secondary | ICD-10-CM

## 2019-06-24 DIAGNOSIS — Y92009 Unspecified place in unspecified non-institutional (private) residence as the place of occurrence of the external cause: Secondary | ICD-10-CM

## 2019-06-24 DIAGNOSIS — F320 Major depressive disorder, single episode, mild: Secondary | ICD-10-CM

## 2019-06-24 DIAGNOSIS — L89893 Pressure ulcer of other site, stage 3: Secondary | ICD-10-CM

## 2019-06-24 DIAGNOSIS — I472 Ventricular tachycardia, unspecified: Secondary | ICD-10-CM

## 2019-06-24 DIAGNOSIS — Z794 Long term (current) use of insulin: Secondary | ICD-10-CM | POA: Diagnosis not present

## 2019-06-24 DIAGNOSIS — S0181XD Laceration without foreign body of other part of head, subsequent encounter: Secondary | ICD-10-CM

## 2019-06-24 DIAGNOSIS — W19XXXD Unspecified fall, subsequent encounter: Secondary | ICD-10-CM | POA: Diagnosis not present

## 2019-06-24 DIAGNOSIS — E1121 Type 2 diabetes mellitus with diabetic nephropathy: Secondary | ICD-10-CM | POA: Diagnosis not present

## 2019-06-24 MED ORDER — INSULIN LISPRO (1 UNIT DIAL) 100 UNIT/ML (KWIKPEN): 5.0000 [IU] | PEN_INJECTOR | Freq: Three times a day (TID) | SUBCUTANEOUS | 4 refills | Status: DC

## 2019-06-24 MED ORDER — MIRTAZAPINE 15 MG PO TABS: 15.0000 mg | ORAL_TABLET | Freq: Every day | ORAL | 2 refills | Status: DC

## 2019-06-24 MED ORDER — LANTUS SOLOSTAR 100 UNIT/ML ~~LOC~~ SOPN: 35.0000 [IU] | PEN_INJECTOR | Freq: Every day | SUBCUTANEOUS | 4 refills | Status: DC

## 2019-06-24 NOTE — Telephone Encounter (Signed)
Pt daughter is requesting that Dr. [name] contacts her directly.

## 2019-06-24 NOTE — Assessment & Plan Note (Signed)
BP adequately controlled. Continue low sat diet. No changes in current management.

## 2019-06-24 NOTE — Patient Instructions (Signed)
A few things to remember from today's visit:   Type 2 diabetes mellitus with diabetic nephropathy, with long-term current use of insulin ([place]) - Plan: insulin glargine (LANTUS SOLOSTAR) 100 UNIT/ML Solostar Pen, insulin lispro (HUMALOG KWIKPEN) 100 UNIT/ML KwikPen  Fall in home, subsequent encounter  Concussion without loss of consciousness, subsequent encounter  Monitor for changes in behavior of headache. Lantus increased to 35 and Humalog added today, 5-10 U 15 min before meals. Please be sure medication list is accurate. If a new problem present, please set up appointment sooner than planned today.

## 2019-06-24 NOTE — Telephone Encounter (Signed)
Left vm for pt daughter [name] to give a call back to have pt scheduled at 3:30 today to have stiches removed. Okayed per Dr. [name].

## 2019-06-24 NOTE — Telephone Encounter (Signed)
Spoke with [name]. Appt made for 4pm today.

## 2019-06-24 NOTE — Assessment & Plan Note (Signed)
On Amiodarone and following with cardiologist.

## 2019-06-24 NOTE — Assessment & Plan Note (Signed)
Stable. Continue Losartan and Carvedilol. Following with cardiologist.

## 2019-06-24 NOTE — Progress Notes (Signed)
HPI: [name] [name] is a 74 y.o. male, who is here today to follow on recent ER visit. Last visit on 08/12/18. Since his last visit he has followed with urologist's office,changing urine cath monthly, he has an appt coming to see urologist. He also follow with his nephrologist,CKD   He was seen in the ER on 06/15/19 Presented to the ER via EMS because fall at home. He was seated on a lifting chair, trying to bend down to pick up a napkin and at the same time lifting chair,lost balance, fell, landed on face. Denies LOC but according to his daughter he "was out of it."  Forehead 4 cm  laceration was sutured, 4 stitches.  He is wheel chair bound.  Head CT: 1. Right frontal scalp laceration and hematoma. 2. No acute intracranial process. 3. Extensive chronic small-vessel ischemic changes throughout the white matter and central pons.  Cervical CT: 1. Multilevel cervical spondylosis. No acute cervical spine fracture.  He has not had headache, visual changes,MS changes,or focal deficit.  His daughter thinks cold medication could have been a contriuting factor for fall. He has been coughing for about 10-11 days. Wet cough, he has difficulty bringing sputum up,it is clear. No hemoptysis. He was taken Coricidin.  + Rhinorrhea and some nasal congestion.  He states that his taste was off for a few days, it is back. Negative for ageustia. SOB at rest, no wheezing.  He is feeling better.  CXR was done during ER visit, negative for acute intrathoracic process.  DM II: BS 482 when EMS got home. Fasting BS' < 140. He is not checking post prandial BS's He is on Lantus 30 U daily. Denies abdominal pain, nausea,vomiting, polydipsia,polyuria, or polyphagia.  Foot ulcers,bilateral.He is still following with wound clinic.   12/2018 HgA1C was 9.4 at his nephrologist's office.            HTN: He is on Cozaar 25 mg daily and Carvedilol 3.125 mg bid. CAD (MI in 2005. HFrEF,  LVEF 15%. S/P ICD placement. He is on Torsemide 20 mg q 2 days. V-Tach on Amiodarone 200 mg daily. He has not noted palpitations,orthopnea,PND,or edema. Follows with cardiologist.  Depression: He is on Remeron 15 mg at bedtime. Still helping with mood. No side effects.  Review of Systems  Constitutional: Positive for fatigue. Negative for activity change, appetite change and fever.  HENT: Negative for mouth sores, nosebleeds and sore throat.   Eyes: Negative for redness and visual disturbance.  Cardiovascular: Negative for chest pain, palpitations and leg swelling.  Genitourinary: Negative for decreased urine volume and hematuria.  Musculoskeletal: Positive for gait problem.  Skin: Positive for wound (Feet). Negative for rash.  Neurological: Negative for syncope, weakness and headaches.  Psychiatric/Behavioral: Negative for confusion and hallucinations.  Rest see pertinent positives and negatives per HPI.  Current Outpatient Medications on File Prior to Visit  Medication Sig Dispense Refill  . Alcohol Swabs (B-D SINGLE USE SWABS REGULAR) PADS USE TO TEST BLOOD SUGAR DAILY 100 each 3  . amiodarone (PACERONE) 200 MG tablet TAKE 1 TABLET EVERY DAY 90 tablet 3  . Ascorbic Acid (VITAMIN C PO) Take 1 tablet by mouth 2 (two) times daily.    Marland Kitchen aspirin EC 81 MG tablet Take 81 mg by mouth daily.    . Blood Glucose Calibration (TRUE METRIX LEVEL 1) Low SOLN USE TO TEST BLOOD SUGAR DAILY 1 each 3  . Blood Glucose Monitoring Suppl (TRUE METRIX AIR GLUCOSE  METER) w/Device KIT 1 Device by Does not apply route daily. 1 kit 1  . Blood Pressure Monitoring (BLOOD PRESSURE MONITOR AUTOMAT) DEVI To check BP daily 1 Device 0  . carvedilol (COREG) 3.125 MG tablet Take 1 tablet (3.125 mg total) by mouth 2 (two) times daily with a meal. 180 tablet 2  . clotrimazole-betamethasone (LOTRISONE) cream Apply 1 application topically daily as needed. mall amount. 30 g 1  . finasteride (PROSCAR) 5 MG tablet TAKE 1  TABLET EVERY DAY 90 tablet 1  . losartan (COZAAR) 25 MG tablet TAKE 1/2 TABLET TWICE DAILY 90 tablet 2  . MAGNESIUM PO Take 1 tablet by mouth every other day.    . multivitamin-iron-minerals-folic acid (THERAPEUTIC-M) TABS tablet Take 1 tablet by mouth daily.    Marland Kitchen nystatin (MYCOSTATIN/NYSTOP) powder Apply topically 3 (three) times daily. (Patient taking differently: Apply topically as needed. ) 60 g 2  . Probiotic Product (PROBIOTIC PO) Take 1 capsule by mouth daily.    . tamsulosin (FLOMAX) 0.4 MG CAPS capsule TAKE 1 CAPSULE EVERY DAY 90 capsule 3  . torsemide (DEMADEX) 20 MG tablet TAKE 1 TABLET EVERY OTHER DAY 45 tablet 0  . TRUE METRIX BLOOD GLUCOSE TEST test strip USE  AS  INSTRUCTED 100 strip 3  . TRUEplus Lancets 30G MISC USE  TO  TEST BLOOD SUGAR EVERY DAY 100 each 5  . vitamin A 10000 UNIT capsule Take 10,000 Units by mouth daily.    Marland Kitchen zinc sulfate 220 (50 Zn) MG capsule Take 220 mg by mouth daily.     No current facility-administered medications on file prior to visit.   Past Medical History:  Diagnosis Date  . Automatic implantable cardioverter-defibrillator in situ   . CAD (coronary artery disease)    a. LHC (08/2005):  Ostial LAD 99%, mid LAD 50%, superior D1 80%, inferior D1 75%, mid OM proximal 80%, proximal PDA 25-30%, mid RCA 99%.   MRI with full thickness scar.  Med Rx.  2015 demonstrated similar diffuse three-vessel coronary disease. Perfusion imaging with rest we distribution demonstrated no viability in the area of the LAD and circumflex. Medical management.  . CAD, multiple vessel, RCA 100% mid occl.; LAD 99% stenosisprox.  mid of 50-60%; LCX OM-1 90%, OM-2 99%,AV groove 80%  12/10/2013  . CHF (congestive heart failure) ([place])   . Chronic combined systolic and diastolic heart failure ([place])   . CKD (chronic kidney disease)   . Coronary artery disease   . Gout   . Hx of echocardiogram 2015   Echo (08/2013):  EF 10-15%, diff HK, mod LAE, severe RVE, mod reduced RVSF, mild  RAE, PASP 41 mmHg.  Marland Kitchen Hypertension   . Ischemic cardiomyopathy    Echo (8/11):  EF 40-45%;  Echo (5/15):  EF 10-15%  . Myocardial infarction [facility]) 2007   out of hosp MI - LHC with 3v CAD rx medically  . NSVT (nonsustained ventricular tachycardia) (HCC)    s/p ICD  . Peripheral neuropathy   . Sleep apnea    "has CPAP; won't use it"  . Stroke [facility]) ~ 2013   "right leg weak since" (12/09/2013)  . Type II diabetes mellitus ([place]) dx'd 2005   Allergies  Allergen Reactions  . Sulfa Antibiotics Rash    Social History   Socioeconomic History  . Marital status: Married    Spouse name: Not on file  . Number of children: Not on file  . Years of education: Not on file  . Highest  education level: Not on file  Occupational History  . Occupation: retired  Tobacco Use  . Smoking status: Never Smoker  . Smokeless tobacco: Never Used  Substance and Sexual Activity  . Alcohol use: No  . Drug use: No  . Sexual activity: Not on file  Other Topics Concern  . Not on file  Social History Narrative   ** Merged History Encounter **       He is retired Veterinary surgeon for [employer].  He took early retirement at 74 years old due to medical reasons. He lives with wife. Highest level of education:  High school    Social Determinants of Health   Financial Resource Strain:   . Difficulty of Paying Living Expenses: Not on file  Food Insecurity:   . Worried About Charity fundraiser in the Last Year: Not on file  . Ran Out of Food in the Last Year: Not on file  Transportation Needs:   . Lack of Transportation (Medical): Not on file  . Lack of Transportation (Non-Medical): Not on file  Physical Activity:   . Days of Exercise per Week: Not on file  . Minutes of Exercise per Session: Not on file  Stress:   . Feeling of Stress : Not on file  Social Connections:   . Frequency of Communication with Friends and Family: Not on file  . Frequency of Social Gatherings with Friends and  Family: Not on file  . Attends Religious Services: Not on file  . Active Member of Clubs or Organizations: Not on file  . Attends Archivist Meetings: Not on file  . Marital Status: Not on file    Vitals:   06/24/19 1603  BP: 124/70  Pulse: (!) 59  Resp: 16  SpO2: 99%   Body mass index is 24.41 kg/m.   Physical Exam  Nursing note and vitals reviewed. Constitutional: He is oriented to person, place, and time. He appears well-developed. No distress.  HENT:  Head: Normocephalic and atraumatic.  Mouth/Throat: Oropharynx is clear and moist and mucous membranes are normal.  Eyes: Pupils are equal, round, and reactive to light. Conjunctivae are normal.  Cardiovascular: Regular rhythm. Bradycardia present.  Murmur (SEM I-II/VI RUSB-LUSB) heard. Respiratory: Effort normal and breath sounds normal. No respiratory distress. He has no wheezes. He has no rales.  Course lung sound that clear with coughing.  GI: Soft. There is no abdominal tenderness.  Genitourinary:    Genitourinary Comments: Urine cath in place.   Musculoskeletal:        General: No edema.  Lymphadenopathy:    He has no cervical adenopathy.  Neurological: He is alert and oriented to person, place, and time. He has normal strength. No cranial nerve deficit.  Skin: Skin is warm. No rash noted. No erythema.  Psychiatric: He has a normal mood and affect.  Well groomed, good eye contact.    ASSESSMENT AND PLAN:  [name] [name] was seen today for suture / staple removal.  Diagnoses and all orders for this visit:  Orders Placed This Encounter  Procedures  . POC HgB A1c   Lab Results  Component Value Date   HGBA1C 12.6 (A) 06/24/2019    Fall in home, subsequent encounter Fall precautions discussed.  Concussion without loss of consciousness, subsequent encounter No signs of symptoms that suggest complications. Continue monitoring for MS changes,focal deficit,or headache. Instructed about warning  signs. Head CT negative for acute intracranial process.  Laceration of forehead, subsequent encounter Healing  well. 4 stitches removed. Tolerated procedure well. Keep wound clean with soap and water.   Chronic combined systolic and diastolic heart failure (HCC) Stable. Continue Losartan and Carvedilol. Following with cardiologist.  Depression, major, single episode, mild ([place]) Problem is well controlled. Continue Remeron 15 mg at bedtime. F/U in 6-12 months.  Hypertension with congestive heart failure and renal failure (HCC) BP adequately controlled. Continue low sat diet. No changes in current management.  Pressure ulcer Stable. Glucose needs to be better controlled. Following with wound clinic.  Type 2 diabetes mellitus with diabetic nephropathy ([place]) Problem is not well controlled. Lantus dose increased from 30 U to 35 U. Humalog added today, 5-10 U 15 min before meals TID. Caution with hypoglycemia. At least annual eye exam. Foot care,ilcers and following with wound clinic.   Ventricular tachycardia ([place]) On Amiodarone and following with cardiologist.   Return in about 4 months (around 10/24/2019).    Hank Walling G. [name], MD  [facility]. [place] office.

## 2019-06-24 NOTE — Telephone Encounter (Signed)
Spoke with [name], appt made for 4pm today.

## 2019-06-24 NOTE — Assessment & Plan Note (Addendum)
Problem is not well controlled. Lantus dose increased from 30 U to 35 U. Humalog added today, 5-10 U 15 min before meals TID. Caution with hypoglycemia. At least annual eye exam. Foot care,ilcers and following with wound clinic.

## 2019-06-24 NOTE — Assessment & Plan Note (Signed)
Problem is well controlled. Continue Remeron 15 mg at bedtime. F/U in 6-12 months.

## 2019-06-24 NOTE — Assessment & Plan Note (Addendum)
Stable. Glucose needs to be better controlled. Following with wound clinic.

## 2019-06-24 NOTE — Telephone Encounter (Signed)
[name], pt's daughter, calling back because pt can't come in today to have stitches removed. I did offer a 3:30 p.m. appt for today. Per [name], pt has a cold and cough. [name], would like to know what can she do since she can't take him anywhere because of his cold and cough. Thanks

## 2019-06-25 ENCOUNTER — Telehealth: Payer: Self-pay | Admitting: Family Medicine

## 2019-06-25 NOTE — Telephone Encounter (Signed)
[name] [name] from [place] had a few questions about pt prescriptions.

## 2019-06-26 ENCOUNTER — Telehealth: Payer: Self-pay | Admitting: Family Medicine

## 2019-06-26 ENCOUNTER — Other Ambulatory Visit: Payer: Self-pay | Admitting: Family Medicine

## 2019-06-26 LAB — POCT GLYCOSYLATED HEMOGLOBIN (HGB A1C): Hemoglobin A1C: 12.6 % — AB (ref 4.0–5.6)

## 2019-06-26 MED ORDER — INSULIN ASPART 100 UNIT/ML ~~LOC~~ SOLN: 10.0000 [IU] | Freq: Three times a day (TID) | SUBCUTANEOUS | 11 refills | Status: DC

## 2019-06-26 NOTE — Telephone Encounter (Signed)
Insurance will not cover the insulin (Humalog) that was sent in. Pharmacy is wondering is there is a different brand that can be called in?  Phone: [phone]  Fax: [phone]   Pt was suppose to start two days ago and they told the pharmacy blood sugar was high need it ASAP

## 2019-06-26 NOTE — Telephone Encounter (Signed)
I spoke with pharmacy. They need a max daily dose on pt's Humalog.

## 2019-06-26 NOTE — Telephone Encounter (Signed)
Novolog is on pt's formulary. Rx sent to pharmacy with same instructions.

## 2019-06-26 NOTE — Telephone Encounter (Signed)
Between dose before meals and sliding scale, max 40 U daily. Thanks, BJ

## 2019-06-26 NOTE — Telephone Encounter (Signed)
Please advise 

## 2019-06-26 NOTE — Telephone Encounter (Signed)
What about Novolog? Same dose 5-10 min before meals. Thanks, BJ

## 2019-06-26 NOTE — Telephone Encounter (Signed)
Added to new Rx for pt.

## 2019-06-27 ENCOUNTER — Encounter: Payer: Self-pay | Admitting: Family Medicine

## 2019-06-30 ENCOUNTER — Encounter (HOSPITAL_BASED_OUTPATIENT_CLINIC_OR_DEPARTMENT_OTHER): Payer: Medicare HMO | Attending: Internal Medicine | Admitting: Internal Medicine

## 2019-06-30 ENCOUNTER — Other Ambulatory Visit (HOSPITAL_COMMUNITY): Payer: Self-pay | Admitting: Internal Medicine

## 2019-06-30 ENCOUNTER — Ambulatory Visit (HOSPITAL_COMMUNITY)
Admission: RE | Admit: 2019-06-30 | Discharge: 2019-06-30 | Disposition: A | Discharge status: Home / Self Care | Payer: Medicare HMO | Source: Ambulatory Visit | Attending: Internal Medicine | Admitting: Internal Medicine

## 2019-06-30 ENCOUNTER — Other Ambulatory Visit: Payer: Self-pay

## 2019-06-30 DIAGNOSIS — Z8673 Personal history of transient ischemic attack (TIA), and cerebral infarction without residual deficits: Secondary | ICD-10-CM | POA: Insufficient documentation

## 2019-06-30 DIAGNOSIS — L89613 Pressure ulcer of right heel, stage 3: Secondary | ICD-10-CM | POA: Insufficient documentation

## 2019-06-30 DIAGNOSIS — E11621 Type 2 diabetes mellitus with foot ulcer: Secondary | ICD-10-CM | POA: Diagnosis not present

## 2019-06-30 DIAGNOSIS — M86171 Other acute osteomyelitis, right ankle and foot: Secondary | ICD-10-CM | POA: Diagnosis not present

## 2019-06-30 DIAGNOSIS — E1122 Type 2 diabetes mellitus with diabetic chronic kidney disease: Secondary | ICD-10-CM | POA: Diagnosis not present

## 2019-06-30 DIAGNOSIS — I2510 Atherosclerotic heart disease of native coronary artery without angina pectoris: Secondary | ICD-10-CM | POA: Diagnosis not present

## 2019-06-30 DIAGNOSIS — L97221 Non-pressure chronic ulcer of left calf limited to breakdown of skin: Secondary | ICD-10-CM | POA: Insufficient documentation

## 2019-06-30 DIAGNOSIS — L97521 Non-pressure chronic ulcer of other part of left foot limited to breakdown of skin: Secondary | ICD-10-CM | POA: Diagnosis not present

## 2019-06-30 DIAGNOSIS — N189 Chronic kidney disease, unspecified: Secondary | ICD-10-CM | POA: Insufficient documentation

## 2019-06-30 DIAGNOSIS — N312 Flaccid neuropathic bladder, not elsewhere classified: Secondary | ICD-10-CM | POA: Diagnosis not present

## 2019-06-30 DIAGNOSIS — I5042 Chronic combined systolic (congestive) and diastolic (congestive) heart failure: Secondary | ICD-10-CM | POA: Insufficient documentation

## 2019-06-30 DIAGNOSIS — L97321 Non-pressure chronic ulcer of left ankle limited to breakdown of skin: Secondary | ICD-10-CM | POA: Insufficient documentation

## 2019-06-30 DIAGNOSIS — I130 Hypertensive heart and chronic kidney disease with heart failure and stage 1 through stage 4 chronic kidney disease, or unspecified chronic kidney disease: Secondary | ICD-10-CM | POA: Insufficient documentation

## 2019-06-30 DIAGNOSIS — L97412 Non-pressure chronic ulcer of right heel and midfoot with fat layer exposed: Secondary | ICD-10-CM | POA: Diagnosis not present

## 2019-06-30 DIAGNOSIS — L97419 Non-pressure chronic ulcer of right heel and midfoot with unspecified severity: Secondary | ICD-10-CM | POA: Diagnosis not present

## 2019-06-30 DIAGNOSIS — I252 Old myocardial infarction: Secondary | ICD-10-CM | POA: Insufficient documentation

## 2019-07-01 ENCOUNTER — Telehealth: Payer: Self-pay

## 2019-07-01 ENCOUNTER — Other Ambulatory Visit: Payer: Self-pay

## 2019-07-01 NOTE — Progress Notes (Addendum)
not kerlix Avoid standing for long periods of time Elevate legs to the level of the heart or above for 30 minutes daily and/or when sitting, a frequency of: - throughout the day Radiology ordered were: X-ray, heel, right - non healing wound The following medication(s) was prescribed: Santyl topical 250 unit/gram ointment ointment topical to wound on right heel change daily starting 06/30/2019 1. Change the primary dressing to Santyl to the right heel. If this is unaffordable they will call us back 2. Continue silver alginate to the rest on the left leg 3. I think a lot of this is a pressure issue however going over this with him does not really give me idea of exactly how this is happening. He does not have a significant PAD based on noninvasive studies Electronic Signature(s) Signed: 07/04/2019 7:03:15 AM By: Linton Ham MD Signed: 07/06/2019 5:44:18 PM By: Levan Hurst RN, BSN Previous Signature: 06/30/2019 5:48:31 PM Version By: Linton Ham MD Entered By: Levan Hurst on 07/02/2019 14:03:43 -------------------------------------------------------------------------------- SuperBill Details Patient Name: Date of Service: [name] 06/30/2019 Medical Record [id number] Patient Account Number: [id number] Date of Birth/Sex: Treating RN: [birth year] (73 y.o. 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 L97.521 Non-pressure chronic ulcer of other part of left foot limited to breakdown of skin Facility Procedures CPT4 Code: [id number] 1 Description: 6387 - DEB SUBQ TISSUE 20 SQ CM/< ICD-10 Diagnosis Description L89.613 Pressure ulcer of right heel, stage 3 Modifier: Quantity: 1 Physician Procedures CPT4 Code: [id number] Description: 51884 - WC PHYS SUBQ TISS 20 SQ CM ICD-10 Diagnosis Description L89.613 Pressure ulcer of right heel, stage 3 Modifier: Quantity: 1 Electronic Signature(s) Signed: 06/30/2019 5:48:31 PM By: Linton Ham MD Entered By: Linton Ham on 06/30/2019 11:10:53  LUVERN, MCISAAC ([id number]) Visit Report for 06/30/2019 Debridement Details Patient Name: Date of Service: CALEL, PISARSKI 06/30/2019 8:45 AM Medical Record [id number] Patient Account Number: [id number] Date of Birth/Sex: Treating RN: 02-27-1946 (73 y.o. [name]) [name] Primary Care Provider: [name], [name] Other Clinician: Referring Provider: Treating Provider/Extender:Donnalyn Juran [name], [name] in Treatment: 167 Debridement Performed for Wound #23 Right,Lateral Calcaneus Assessment: Performed By: Physician [name], MD Debridement Type: Debridement Severity of Tissue Pre Fat layer exposed Debridement: Level of Consciousness (Pre- Awake and Alert procedure): Pre-procedure Verification/Time Out Taken: Yes - 10:21 Start Time: 10:21 Pain Control: Lidocaine 5% topical ointment Total Area Debrided (L x W): 7.3 (cm) x 1.7 (cm) = 12.41 (cm) Tissue and other material Viable, Non-Viable, Slough, Subcutaneous, Skin: Dermis , Skin: Epidermis, Slough debrided: Level: Skin/Subcutaneous Tissue Debridement Description: Excisional Instrument: Curette Bleeding: Moderate Hemostasis Achieved: Pressure End Time: 10:24 Procedural Pain: 3 Post Procedural Pain: 0 Response to Treatment: Procedure was tolerated well Level of Consciousness Awake and Alert (Post-procedure): Post Debridement Measurements of Total Wound Length: (cm) 7.3 Width: (cm) 1.7 Depth: (cm) 0.2 Volume: (cm) 1.949 Character of Wound/Ulcer Post Improved Debridement: Severity of Tissue Post Debridement: Fat layer exposed Post Procedure Diagnosis Same as Pre-procedure Electronic Signature(s) Signed: 06/30/2019 5:48:31 PM By: Linton Ham MD Signed: 07/01/2019 7:20:02 AM By: [name] RN Entered By: Linton Ham on 06/30/2019 11:06:16 -------------------------------------------------------------------------------- HPI Details Patient Name: Date of Service: [name] 06/30/2019  8:45 AM Medical Record [id number] Patient Account Number: [id number] Date of Birth/Sex: Treating RN: 09-18-1945 (73 y.o. [name] Primary Care Provider: [name], [name] Other Clinician: Referring Provider: Treating Provider/Extender:Mally Gavina [name], [name] in Treatment: 167 History of Present Illness Location: On the left and right lateral forefoot which has been there for about 6 months Quality: Patient reports No Pain. Severity: Patient states wound(s) are getting worse. Duration: Patient has had the wound for > 6 months prior to seeking treatment at the wound center Context: The wound would happen gradually Modifying Factors: Patient wound(s)/ulcer(s) are worsening due to :continual drainage from the wound Associated Signs and Symptoms: Patient reports having increase discharge. HPI Description: This patient returns after being seen here till the end of August and he was lost to follow-up. he has been quite debilitated laying in bed most of the time and his condition has deteriorated significantly. He has multiple ulcerations on the heel lateral forefoot and some of his toes. ======== Old notes: 74 year old male known to our practice when he was seen here in February and March and was lost to follow-up when he was admitted to hospital with various medical problems including coronary artery disease and a stroke. Now returns with the problem on the left forefoot where he has an ulceration and this has been there for about 6 months. 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 He has had persistent hyperglycemia given recently. Patient has a past medical history of diabetes mellitus, hypertension, combined systolic and diastolic heart failure, peripheral neuropathy, gout, cardiomyopathy with ejection fraction of about 10-15%, coronary artery disease, recent ventricular fibrillation, chronic kidney disease,  and/or when sitting, a frequency of: - throughout the day Radiology X-ray, heel, right - non healing wound - (ICD10 L89.613 - Pressure ulcer of right heel, stage 3) Patient Medications Allergies: Sulfa Notifications Medication Indication Start End Santyl 06/30/2019 DOSE topical 250 unit/gram ointment - ointment topical to wound on right heel change daily Electronic Signature(s) Signed: 06/30/2019 10:30:10 AM By: Linton Ham MD Entered By: Linton Ham on 06/30/2019 10:30:09 -------------------------------------------------------------------------------- Prescription 06/30/2019 Patient Name: [name] Provider: Linton Ham MD Date of Birth: 1945/12/04 NPI#: [id number] Sex: M DEA#: [id number] Phone #: [phone] License #: [id number] Patient Address: [address] [place], [place] [address] [place], [place] [address] [phone] Allergies Sulfa Provider's Orders X-ray, heel, right - ICD10: O67.124 - non healing wound Signature(s): Date(s): Electronic Signature(s) Signed: 06/30/2019 5:48:31 PM By: Linton Ham MD Entered By: Linton Ham on 06/30/2019 10:30:10 --------------------------------------------------------------------------------  Problem List Details Patient Name: Date of Service: [name] 06/30/2019 8:45 AM Medical Record [id number] Patient Account Number: [id number] Date of Birth/Sex: Treating RN: 08-Feb-1946 (73 y.o. Staci Acosta, Morey Hummingbird Primary Care Provider: Other Clinician: [name], [name] Referring Provider: Treating Provider/Extender:Zoiey Christy [name], [name] in  Treatment: 167 Active Problems ICD-10 Evaluated Encounter Code Description Active Date Today Diagnosis L89.613 Pressure ulcer of right heel, stage 3 04/17/2016 No Yes L97.221 Non-pressure chronic ulcer of left calf limited to 08/15/2017 No Yes breakdown of skin L97.321 Non-pressure chronic ulcer of left ankle limited to 07/01/2018 No Yes breakdown of skin L97.521 Non-pressure chronic ulcer of other part of left foot 02/17/2019 No Yes limited to breakdown of skin Inactive Problems ICD-10 Code Description Active Date Inactive Date E11.621 Type 2 diabetes mellitus with foot ulcer 04/17/2016 04/17/2016 L03.032 Cellulitis of left toe 01/06/2019 01/06/2019 J67.341 Pressure ulcer of left heel, stage 3 12/04/2016 12/04/2016 I25.119 Atherosclerotic heart disease of native coronary artery with 04/17/2016 04/17/2016 unspecified angina pectoris L97.821 Non-pressure chronic ulcer of other part of left lower leg limited 01/06/2019 01/06/2019 to breakdown of skin S51.811D Laceration without foreign body of right forearm, subsequent 10/22/2017 10/22/2017 encounter P37.90 Acute diastolic (congestive) heart failure 04/17/2016 04/17/2016 L03.116 Cellulitis of left lower limb 12/24/2017 12/24/2017 Resolved Problems ICD-10 Code Description Active Date Resolved Date L89.512 Pressure ulcer of right ankle, stage 2 04/17/2016 04/17/2016 W40.973 Pressure ulcer of left ankle, stage 2 04/17/2016 04/17/2016 Electronic Signature(s) Signed: 06/30/2019 5:48:31 PM By: Linton Ham MD Entered By: Linton Ham on 06/30/2019 11:05:52 -------------------------------------------------------------------------------- Progress Note Details Patient Name: Date of Service: [name] 06/30/2019 8:45 AM Medical Record [id number] Patient Account Number: [id number] Date of Birth/Sex: Treating RN: 28-Apr-1945 (73 y.o. [name] Primary Care Provider: [name], [name] Other Clinician: Referring Provider: Treating  Provider/Extender:Adelis Docter [name], [name] in Treatment: 167 Subjective History of Present Illness (HPI) The following HPI elements were documented for the patient's wound: Location: On the left and right lateral forefoot which has been there for about 6 months Quality: Patient reports No Pain. Severity: Patient states wound(s) are getting worse. Duration: Patient has had the wound for > 6 months prior to seeking treatment at the wound center Context: The wound would happen gradually Modifying Factors: Patient wound(s)/ulcer(s) are worsening due to :continual drainage from the wound Associated Signs and Symptoms: Patient reports having increase discharge. This patient returns after being seen here till the end of August and he was lost to follow-up. he has been quite debilitated laying in bed most of the time and his condition  and/or when sitting, a frequency of: - throughout the day Radiology X-ray, heel, right - non healing wound - (ICD10 L89.613 - Pressure ulcer of right heel, stage 3) Patient Medications Allergies: Sulfa Notifications Medication Indication Start End Santyl 06/30/2019 DOSE topical 250 unit/gram ointment - ointment topical to wound on right heel change daily Electronic Signature(s) Signed: 06/30/2019 10:30:10 AM By: Linton Ham MD Entered By: Linton Ham on 06/30/2019 10:30:09 -------------------------------------------------------------------------------- Prescription 06/30/2019 Patient Name: [name] Provider: Linton Ham MD Date of Birth: 1945/12/04 NPI#: [id number] Sex: M DEA#: [id number] Phone #: [phone] License #: [id number] Patient Address: [address] [place], [place] [address] [place], [place] [address] [phone] Allergies Sulfa Provider's Orders X-ray, heel, right - ICD10: O67.124 - non healing wound Signature(s): Date(s): Electronic Signature(s) Signed: 06/30/2019 5:48:31 PM By: Linton Ham MD Entered By: Linton Ham on 06/30/2019 10:30:10 --------------------------------------------------------------------------------  Problem List Details Patient Name: Date of Service: [name] 06/30/2019 8:45 AM Medical Record [id number] Patient Account Number: [id number] Date of Birth/Sex: Treating RN: 08-Feb-1946 (73 y.o. Staci Acosta, Morey Hummingbird Primary Care Provider: Other Clinician: [name], [name] Referring Provider: Treating Provider/Extender:Zoiey Christy [name], [name] in  Treatment: 167 Active Problems ICD-10 Evaluated Encounter Code Description Active Date Today Diagnosis L89.613 Pressure ulcer of right heel, stage 3 04/17/2016 No Yes L97.221 Non-pressure chronic ulcer of left calf limited to 08/15/2017 No Yes breakdown of skin L97.321 Non-pressure chronic ulcer of left ankle limited to 07/01/2018 No Yes breakdown of skin L97.521 Non-pressure chronic ulcer of other part of left foot 02/17/2019 No Yes limited to breakdown of skin Inactive Problems ICD-10 Code Description Active Date Inactive Date E11.621 Type 2 diabetes mellitus with foot ulcer 04/17/2016 04/17/2016 L03.032 Cellulitis of left toe 01/06/2019 01/06/2019 J67.341 Pressure ulcer of left heel, stage 3 12/04/2016 12/04/2016 I25.119 Atherosclerotic heart disease of native coronary artery with 04/17/2016 04/17/2016 unspecified angina pectoris L97.821 Non-pressure chronic ulcer of other part of left lower leg limited 01/06/2019 01/06/2019 to breakdown of skin S51.811D Laceration without foreign body of right forearm, subsequent 10/22/2017 10/22/2017 encounter P37.90 Acute diastolic (congestive) heart failure 04/17/2016 04/17/2016 L03.116 Cellulitis of left lower limb 12/24/2017 12/24/2017 Resolved Problems ICD-10 Code Description Active Date Resolved Date L89.512 Pressure ulcer of right ankle, stage 2 04/17/2016 04/17/2016 W40.973 Pressure ulcer of left ankle, stage 2 04/17/2016 04/17/2016 Electronic Signature(s) Signed: 06/30/2019 5:48:31 PM By: Linton Ham MD Entered By: Linton Ham on 06/30/2019 11:05:52 -------------------------------------------------------------------------------- Progress Note Details Patient Name: Date of Service: [name] 06/30/2019 8:45 AM Medical Record [id number] Patient Account Number: [id number] Date of Birth/Sex: Treating RN: 28-Apr-1945 (73 y.o. [name] Primary Care Provider: [name], [name] Other Clinician: Referring Provider: Treating  Provider/Extender:Adelis Docter [name], [name] in Treatment: 167 Subjective History of Present Illness (HPI) The following HPI elements were documented for the patient's wound: Location: On the left and right lateral forefoot which has been there for about 6 months Quality: Patient reports No Pain. Severity: Patient states wound(s) are getting worse. Duration: Patient has had the wound for > 6 months prior to seeking treatment at the wound center Context: The wound would happen gradually Modifying Factors: Patient wound(s)/ulcer(s) are worsening due to :continual drainage from the wound Associated Signs and Symptoms: Patient reports having increase discharge. This patient returns after being seen here till the end of August and he was lost to follow-up. he has been quite debilitated laying in bed most of the time and his condition  and/or when sitting, a frequency of: - throughout the day Radiology X-ray, heel, right - non healing wound - (ICD10 L89.613 - Pressure ulcer of right heel, stage 3) Patient Medications Allergies: Sulfa Notifications Medication Indication Start End Santyl 06/30/2019 DOSE topical 250 unit/gram ointment - ointment topical to wound on right heel change daily Electronic Signature(s) Signed: 06/30/2019 10:30:10 AM By: Linton Ham MD Entered By: Linton Ham on 06/30/2019 10:30:09 -------------------------------------------------------------------------------- Prescription 06/30/2019 Patient Name: [name] Provider: Linton Ham MD Date of Birth: 1945/12/04 NPI#: [id number] Sex: M DEA#: [id number] Phone #: [phone] License #: [id number] Patient Address: [address] [place], [place] [address] [place], [place] [address] [phone] Allergies Sulfa Provider's Orders X-ray, heel, right - ICD10: O67.124 - non healing wound Signature(s): Date(s): Electronic Signature(s) Signed: 06/30/2019 5:48:31 PM By: Linton Ham MD Entered By: Linton Ham on 06/30/2019 10:30:10 --------------------------------------------------------------------------------  Problem List Details Patient Name: Date of Service: [name] 06/30/2019 8:45 AM Medical Record [id number] Patient Account Number: [id number] Date of Birth/Sex: Treating RN: 08-Feb-1946 (73 y.o. Staci Acosta, Morey Hummingbird Primary Care Provider: Other Clinician: [name], [name] Referring Provider: Treating Provider/Extender:Zoiey Christy [name], [name] in  Treatment: 167 Active Problems ICD-10 Evaluated Encounter Code Description Active Date Today Diagnosis L89.613 Pressure ulcer of right heel, stage 3 04/17/2016 No Yes L97.221 Non-pressure chronic ulcer of left calf limited to 08/15/2017 No Yes breakdown of skin L97.321 Non-pressure chronic ulcer of left ankle limited to 07/01/2018 No Yes breakdown of skin L97.521 Non-pressure chronic ulcer of other part of left foot 02/17/2019 No Yes limited to breakdown of skin Inactive Problems ICD-10 Code Description Active Date Inactive Date E11.621 Type 2 diabetes mellitus with foot ulcer 04/17/2016 04/17/2016 L03.032 Cellulitis of left toe 01/06/2019 01/06/2019 J67.341 Pressure ulcer of left heel, stage 3 12/04/2016 12/04/2016 I25.119 Atherosclerotic heart disease of native coronary artery with 04/17/2016 04/17/2016 unspecified angina pectoris L97.821 Non-pressure chronic ulcer of other part of left lower leg limited 01/06/2019 01/06/2019 to breakdown of skin S51.811D Laceration without foreign body of right forearm, subsequent 10/22/2017 10/22/2017 encounter P37.90 Acute diastolic (congestive) heart failure 04/17/2016 04/17/2016 L03.116 Cellulitis of left lower limb 12/24/2017 12/24/2017 Resolved Problems ICD-10 Code Description Active Date Resolved Date L89.512 Pressure ulcer of right ankle, stage 2 04/17/2016 04/17/2016 W40.973 Pressure ulcer of left ankle, stage 2 04/17/2016 04/17/2016 Electronic Signature(s) Signed: 06/30/2019 5:48:31 PM By: Linton Ham MD Entered By: Linton Ham on 06/30/2019 11:05:52 -------------------------------------------------------------------------------- Progress Note Details Patient Name: Date of Service: [name] 06/30/2019 8:45 AM Medical Record [id number] Patient Account Number: [id number] Date of Birth/Sex: Treating RN: 28-Apr-1945 (73 y.o. [name] Primary Care Provider: [name], [name] Other Clinician: Referring Provider: Treating  Provider/Extender:Adelis Docter [name], [name] in Treatment: 167 Subjective History of Present Illness (HPI) The following HPI elements were documented for the patient's wound: Location: On the left and right lateral forefoot which has been there for about 6 months Quality: Patient reports No Pain. Severity: Patient states wound(s) are getting worse. Duration: Patient has had the wound for > 6 months prior to seeking treatment at the wound center Context: The wound would happen gradually Modifying Factors: Patient wound(s)/ulcer(s) are worsening due to :continual drainage from the wound Associated Signs and Symptoms: Patient reports having increase discharge. This patient returns after being seen here till the end of August and he was lost to follow-up. he has been quite debilitated laying in bed most of the time and his condition  LUVERN, MCISAAC ([id number]) Visit Report for 06/30/2019 Debridement Details Patient Name: Date of Service: CALEL, PISARSKI 06/30/2019 8:45 AM Medical Record [id number] Patient Account Number: [id number] Date of Birth/Sex: Treating RN: 02-27-1946 (73 y.o. [name]) [name] Primary Care Provider: [name], [name] Other Clinician: Referring Provider: Treating Provider/Extender:Donnalyn Juran [name], [name] in Treatment: 167 Debridement Performed for Wound #23 Right,Lateral Calcaneus Assessment: Performed By: Physician [name], MD Debridement Type: Debridement Severity of Tissue Pre Fat layer exposed Debridement: Level of Consciousness (Pre- Awake and Alert procedure): Pre-procedure Verification/Time Out Taken: Yes - 10:21 Start Time: 10:21 Pain Control: Lidocaine 5% topical ointment Total Area Debrided (L x W): 7.3 (cm) x 1.7 (cm) = 12.41 (cm) Tissue and other material Viable, Non-Viable, Slough, Subcutaneous, Skin: Dermis , Skin: Epidermis, Slough debrided: Level: Skin/Subcutaneous Tissue Debridement Description: Excisional Instrument: Curette Bleeding: Moderate Hemostasis Achieved: Pressure End Time: 10:24 Procedural Pain: 3 Post Procedural Pain: 0 Response to Treatment: Procedure was tolerated well Level of Consciousness Awake and Alert (Post-procedure): Post Debridement Measurements of Total Wound Length: (cm) 7.3 Width: (cm) 1.7 Depth: (cm) 0.2 Volume: (cm) 1.949 Character of Wound/Ulcer Post Improved Debridement: Severity of Tissue Post Debridement: Fat layer exposed Post Procedure Diagnosis Same as Pre-procedure Electronic Signature(s) Signed: 06/30/2019 5:48:31 PM By: Linton Ham MD Signed: 07/01/2019 7:20:02 AM By: [name] RN Entered By: Linton Ham on 06/30/2019 11:06:16 -------------------------------------------------------------------------------- HPI Details Patient Name: Date of Service: [name] 06/30/2019  8:45 AM Medical Record [id number] Patient Account Number: [id number] Date of Birth/Sex: Treating RN: 09-18-1945 (73 y.o. [name] Primary Care Provider: [name], [name] Other Clinician: Referring Provider: Treating Provider/Extender:Mally Gavina [name], [name] in Treatment: 167 History of Present Illness Location: On the left and right lateral forefoot which has been there for about 6 months Quality: Patient reports No Pain. Severity: Patient states wound(s) are getting worse. Duration: Patient has had the wound for > 6 months prior to seeking treatment at the wound center Context: The wound would happen gradually Modifying Factors: Patient wound(s)/ulcer(s) are worsening due to :continual drainage from the wound Associated Signs and Symptoms: Patient reports having increase discharge. HPI Description: This patient returns after being seen here till the end of August and he was lost to follow-up. he has been quite debilitated laying in bed most of the time and his condition has deteriorated significantly. He has multiple ulcerations on the heel lateral forefoot and some of his toes. ======== Old notes: 74 year old male known to our practice when he was seen here in February and March and was lost to follow-up when he was admitted to hospital with various medical problems including coronary artery disease and a stroke. Now returns with the problem on the left forefoot where he has an ulceration and this has been there for about 6 months. 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 He has had persistent hyperglycemia given recently. Patient has a past medical history of diabetes mellitus, hypertension, combined systolic and diastolic heart failure, peripheral neuropathy, gout, cardiomyopathy with ejection fraction of about 10-15%, coronary artery disease, recent ventricular fibrillation, chronic kidney disease,  not kerlix Avoid standing for long periods of time Elevate legs to the level of the heart or above for 30 minutes daily and/or when sitting, a frequency of: - throughout the day Radiology ordered were: X-ray, heel, right - non healing wound The following medication(s) was prescribed: Santyl topical 250 unit/gram ointment ointment topical to wound on right heel change daily starting 06/30/2019 1. Change the primary dressing to Santyl to the right heel. If this is unaffordable they will call us back 2. Continue silver alginate to the rest on the left leg 3. I think a lot of this is a pressure issue however going over this with him does not really give me idea of exactly how this is happening. He does not have a significant PAD based on noninvasive studies Electronic Signature(s) Signed: 07/04/2019 7:03:15 AM By: Linton Ham MD Signed: 07/06/2019 5:44:18 PM By: Levan Hurst RN, BSN Previous Signature: 06/30/2019 5:48:31 PM Version By: Linton Ham MD Entered By: Levan Hurst on 07/02/2019 14:03:43 -------------------------------------------------------------------------------- SuperBill Details Patient Name: Date of Service: [name] 06/30/2019 Medical Record [id number] Patient Account Number: [id number] Date of Birth/Sex: Treating RN: [birth year] (73 y.o. 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 L97.521 Non-pressure chronic ulcer of other part of left foot limited to breakdown of skin Facility Procedures CPT4 Code: 34742595 1 Description: 6387 - DEB SUBQ TISSUE 20 SQ CM/< ICD-10 Diagnosis Description L89.613 Pressure ulcer of right heel, stage 3 Modifier: Quantity: 1 Physician Procedures CPT4 Code: 5643329 Description: 51884 - WC PHYS SUBQ TISS 20 SQ CM ICD-10 Diagnosis Description L89.613 Pressure ulcer of right heel, stage 3 Modifier: Quantity: 1 Electronic Signature(s) Signed: 06/30/2019 5:48:31 PM By: Linton Ham MD Entered By: Linton Ham on 06/30/2019 11:10:53  and/or when sitting, a frequency of: - throughout the day Radiology X-ray, heel, right - non healing wound - (ICD10 L89.613 - Pressure ulcer of right heel, stage 3) Patient Medications Allergies: Sulfa Notifications Medication Indication Start End Santyl 06/30/2019 DOSE topical 250 unit/gram ointment - ointment topical to wound on right heel change daily Electronic Signature(s) Signed: 06/30/2019 10:30:10 AM By: Linton Ham MD Entered By: Linton Ham on 06/30/2019 10:30:09 -------------------------------------------------------------------------------- Prescription 06/30/2019 Patient Name: [name] Provider: Linton Ham MD Date of Birth: 1945/12/04 NPI#: [id number] Sex: M DEA#: [id number] Phone #: [phone] License #: [id number] Patient Address: [address] [place], [place] [address] [place], [place] [address] [phone] Allergies Sulfa Provider's Orders X-ray, heel, right - ICD10: O67.124 - non healing wound Signature(s): Date(s): Electronic Signature(s) Signed: 06/30/2019 5:48:31 PM By: Linton Ham MD Entered By: Linton Ham on 06/30/2019 10:30:10 --------------------------------------------------------------------------------  Problem List Details Patient Name: Date of Service: [name] 06/30/2019 8:45 AM Medical Record [id number] Patient Account Number: [id number] Date of Birth/Sex: Treating RN: 08-Feb-1946 (73 y.o. Staci Acosta, Morey Hummingbird Primary Care Provider: Other Clinician: [name], [name] Referring Provider: Treating Provider/Extender:Zoiey Christy [name], [name] in  Treatment: 167 Active Problems ICD-10 Evaluated Encounter Code Description Active Date Today Diagnosis L89.613 Pressure ulcer of right heel, stage 3 04/17/2016 No Yes L97.221 Non-pressure chronic ulcer of left calf limited to 08/15/2017 No Yes breakdown of skin L97.321 Non-pressure chronic ulcer of left ankle limited to 07/01/2018 No Yes breakdown of skin L97.521 Non-pressure chronic ulcer of other part of left foot 02/17/2019 No Yes limited to breakdown of skin Inactive Problems ICD-10 Code Description Active Date Inactive Date E11.621 Type 2 diabetes mellitus with foot ulcer 04/17/2016 04/17/2016 L03.032 Cellulitis of left toe 01/06/2019 01/06/2019 J67.341 Pressure ulcer of left heel, stage 3 12/04/2016 12/04/2016 I25.119 Atherosclerotic heart disease of native coronary artery with 04/17/2016 04/17/2016 unspecified angina pectoris L97.821 Non-pressure chronic ulcer of other part of left lower leg limited 01/06/2019 01/06/2019 to breakdown of skin S51.811D Laceration without foreign body of right forearm, subsequent 10/22/2017 10/22/2017 encounter P37.90 Acute diastolic (congestive) heart failure 04/17/2016 04/17/2016 L03.116 Cellulitis of left lower limb 12/24/2017 12/24/2017 Resolved Problems ICD-10 Code Description Active Date Resolved Date L89.512 Pressure ulcer of right ankle, stage 2 04/17/2016 04/17/2016 W40.973 Pressure ulcer of left ankle, stage 2 04/17/2016 04/17/2016 Electronic Signature(s) Signed: 06/30/2019 5:48:31 PM By: Linton Ham MD Entered By: Linton Ham on 06/30/2019 11:05:52 -------------------------------------------------------------------------------- Progress Note Details Patient Name: Date of Service: [name] 06/30/2019 8:45 AM Medical Record [id number] Patient Account Number: [id number] Date of Birth/Sex: Treating RN: 28-Apr-1945 (73 y.o. [name] Primary Care Provider: [name], [name] Other Clinician: Referring Provider: Treating  Provider/Extender:Adelis Docter [name], [name] in Treatment: 167 Subjective History of Present Illness (HPI) The following HPI elements were documented for the patient's wound: Location: On the left and right lateral forefoot which has been there for about 6 months Quality: Patient reports No Pain. Severity: Patient states wound(s) are getting worse. Duration: Patient has had the wound for > 6 months prior to seeking treatment at the wound center Context: The wound would happen gradually Modifying Factors: Patient wound(s)/ulcer(s) are worsening due to :continual drainage from the wound Associated Signs and Symptoms: Patient reports having increase discharge. This patient returns after being seen here till the end of August and he was lost to follow-up. he has been quite debilitated laying in bed most of the time and his condition  not kerlix Avoid standing for long periods of time Elevate legs to the level of the heart or above for 30 minutes daily and/or when sitting, a frequency of: - throughout the day Radiology ordered were: X-ray, heel, right - non healing wound The following medication(s) was prescribed: Santyl topical 250 unit/gram ointment ointment topical to wound on right heel change daily starting 06/30/2019 1. Change the primary dressing to Santyl to the right heel. If this is unaffordable they will call us back 2. Continue silver alginate to the rest on the left leg 3. I think a lot of this is a pressure issue however going over this with him does not really give me idea of exactly how this is happening. He does not have a significant PAD based on noninvasive studies Electronic Signature(s) Signed: 07/04/2019 7:03:15 AM By: Linton Ham MD Signed: 07/06/2019 5:44:18 PM By: Levan Hurst RN, BSN Previous Signature: 06/30/2019 5:48:31 PM Version By: Linton Ham MD Entered By: Levan Hurst on 07/02/2019 14:03:43 -------------------------------------------------------------------------------- SuperBill Details Patient Name: Date of Service: [name] 06/30/2019 Medical Record [id number] Patient Account Number: [id number] Date of Birth/Sex: Treating RN: [birth year] (73 y.o. 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 L97.521 Non-pressure chronic ulcer of other part of left foot limited to breakdown of skin Facility Procedures CPT4 Code: 34742595 1 Description: 6387 - DEB SUBQ TISSUE 20 SQ CM/< ICD-10 Diagnosis Description L89.613 Pressure ulcer of right heel, stage 3 Modifier: Quantity: 1 Physician Procedures CPT4 Code: 5643329 Description: 51884 - WC PHYS SUBQ TISS 20 SQ CM ICD-10 Diagnosis Description L89.613 Pressure ulcer of right heel, stage 3 Modifier: Quantity: 1 Electronic Signature(s) Signed: 06/30/2019 5:48:31 PM By: Linton Ham MD Entered By: Linton Ham on 06/30/2019 11:10:53  not kerlix Avoid standing for long periods of time Elevate legs to the level of the heart or above for 30 minutes daily and/or when sitting, a frequency of: - throughout the day Radiology ordered were: X-ray, heel, right - non healing wound The following medication(s) was prescribed: Santyl topical 250 unit/gram ointment ointment topical to wound on right heel change daily starting 06/30/2019 1. Change the primary dressing to Santyl to the right heel. If this is unaffordable they will call us back 2. Continue silver alginate to the rest on the left leg 3. I think a lot of this is a pressure issue however going over this with him does not really give me idea of exactly how this is happening. He does not have a significant PAD based on noninvasive studies Electronic Signature(s) Signed: 07/04/2019 7:03:15 AM By: Linton Ham MD Signed: 07/06/2019 5:44:18 PM By: Levan Hurst RN, BSN Previous Signature: 06/30/2019 5:48:31 PM Version By: Linton Ham MD Entered By: Levan Hurst on 07/02/2019 14:03:43 -------------------------------------------------------------------------------- SuperBill Details Patient Name: Date of Service: [name] 06/30/2019 Medical Record [id number] Patient Account Number: [id number] Date of Birth/Sex: Treating RN: [birth year] (73 y.o. 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 L97.521 Non-pressure chronic ulcer of other part of left foot limited to breakdown of skin Facility Procedures CPT4 Code: 34742595 1 Description: 6387 - DEB SUBQ TISSUE 20 SQ CM/< ICD-10 Diagnosis Description L89.613 Pressure ulcer of right heel, stage 3 Modifier: Quantity: 1 Physician Procedures CPT4 Code: 5643329 Description: 51884 - WC PHYS SUBQ TISS 20 SQ CM ICD-10 Diagnosis Description L89.613 Pressure ulcer of right heel, stage 3 Modifier: Quantity: 1 Electronic Signature(s) Signed: 06/30/2019 5:48:31 PM By: Linton Ham MD Entered By: Linton Ham on 06/30/2019 11:10:53  not kerlix Avoid standing for long periods of time Elevate legs to the level of the heart or above for 30 minutes daily and/or when sitting, a frequency of: - throughout the day Radiology ordered were: X-ray, heel, right - non healing wound The following medication(s) was prescribed: Santyl topical 250 unit/gram ointment ointment topical to wound on right heel change daily starting 06/30/2019 1. Change the primary dressing to Santyl to the right heel. If this is unaffordable they will call us back 2. Continue silver alginate to the rest on the left leg 3. I think a lot of this is a pressure issue however going over this with him does not really give me idea of exactly how this is happening. He does not have a significant PAD based on noninvasive studies Electronic Signature(s) Signed: 07/04/2019 7:03:15 AM By: Linton Ham MD Signed: 07/06/2019 5:44:18 PM By: Levan Hurst RN, BSN Previous Signature: 06/30/2019 5:48:31 PM Version By: Linton Ham MD Entered By: Levan Hurst on 07/02/2019 14:03:43 -------------------------------------------------------------------------------- SuperBill Details Patient Name: Date of Service: [name] 06/30/2019 Medical Record [id number] Patient Account Number: [id number] Date of Birth/Sex: Treating RN: [birth year] (73 y.o. 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 L97.521 Non-pressure chronic ulcer of other part of left foot limited to breakdown of skin Facility Procedures CPT4 Code: 34742595 1 Description: 6387 - DEB SUBQ TISSUE 20 SQ CM/< ICD-10 Diagnosis Description L89.613 Pressure ulcer of right heel, stage 3 Modifier: Quantity: 1 Physician Procedures CPT4 Code: 5643329 Description: 51884 - WC PHYS SUBQ TISS 20 SQ CM ICD-10 Diagnosis Description L89.613 Pressure ulcer of right heel, stage 3 Modifier: Quantity: 1 Electronic Signature(s) Signed: 06/30/2019 5:48:31 PM By: Linton Ham MD Entered By: Linton Ham on 06/30/2019 11:10:53  and/or when sitting, a frequency of: - throughout the day Radiology X-ray, heel, right - non healing wound - (ICD10 L89.613 - Pressure ulcer of right heel, stage 3) Patient Medications Allergies: Sulfa Notifications Medication Indication Start End Santyl 06/30/2019 DOSE topical 250 unit/gram ointment - ointment topical to wound on right heel change daily Electronic Signature(s) Signed: 06/30/2019 10:30:10 AM By: Linton Ham MD Entered By: Linton Ham on 06/30/2019 10:30:09 -------------------------------------------------------------------------------- Prescription 06/30/2019 Patient Name: [name] Provider: Linton Ham MD Date of Birth: 1945/12/04 NPI#: [id number] Sex: M DEA#: [id number] Phone #: [phone] License #: [id number] Patient Address: [address] [place], [place] [address] [place], [place] [address] [phone] Allergies Sulfa Provider's Orders X-ray, heel, right - ICD10: O67.124 - non healing wound Signature(s): Date(s): Electronic Signature(s) Signed: 06/30/2019 5:48:31 PM By: Linton Ham MD Entered By: Linton Ham on 06/30/2019 10:30:10 --------------------------------------------------------------------------------  Problem List Details Patient Name: Date of Service: [name] 06/30/2019 8:45 AM Medical Record [id number] Patient Account Number: [id number] Date of Birth/Sex: Treating RN: 08-Feb-1946 (73 y.o. Staci Acosta, Morey Hummingbird Primary Care Provider: Other Clinician: [name], [name] Referring Provider: Treating Provider/Extender:Zoiey Christy [name], [name] in  Treatment: 167 Active Problems ICD-10 Evaluated Encounter Code Description Active Date Today Diagnosis L89.613 Pressure ulcer of right heel, stage 3 04/17/2016 No Yes L97.221 Non-pressure chronic ulcer of left calf limited to 08/15/2017 No Yes breakdown of skin L97.321 Non-pressure chronic ulcer of left ankle limited to 07/01/2018 No Yes breakdown of skin L97.521 Non-pressure chronic ulcer of other part of left foot 02/17/2019 No Yes limited to breakdown of skin Inactive Problems ICD-10 Code Description Active Date Inactive Date E11.621 Type 2 diabetes mellitus with foot ulcer 04/17/2016 04/17/2016 L03.032 Cellulitis of left toe 01/06/2019 01/06/2019 J67.341 Pressure ulcer of left heel, stage 3 12/04/2016 12/04/2016 I25.119 Atherosclerotic heart disease of native coronary artery with 04/17/2016 04/17/2016 unspecified angina pectoris L97.821 Non-pressure chronic ulcer of other part of left lower leg limited 01/06/2019 01/06/2019 to breakdown of skin S51.811D Laceration without foreign body of right forearm, subsequent 10/22/2017 10/22/2017 encounter P37.90 Acute diastolic (congestive) heart failure 04/17/2016 04/17/2016 L03.116 Cellulitis of left lower limb 12/24/2017 12/24/2017 Resolved Problems ICD-10 Code Description Active Date Resolved Date L89.512 Pressure ulcer of right ankle, stage 2 04/17/2016 04/17/2016 W40.973 Pressure ulcer of left ankle, stage 2 04/17/2016 04/17/2016 Electronic Signature(s) Signed: 06/30/2019 5:48:31 PM By: Linton Ham MD Entered By: Linton Ham on 06/30/2019 11:05:52 -------------------------------------------------------------------------------- Progress Note Details Patient Name: Date of Service: [name] 06/30/2019 8:45 AM Medical Record [id number] Patient Account Number: [id number] Date of Birth/Sex: Treating RN: 28-Apr-1945 (73 y.o. [name] Primary Care Provider: [name], [name] Other Clinician: Referring Provider: Treating  Provider/Extender:Adelis Docter [name], [name] in Treatment: 167 Subjective History of Present Illness (HPI) The following HPI elements were documented for the patient's wound: Location: On the left and right lateral forefoot which has been there for about 6 months Quality: Patient reports No Pain. Severity: Patient states wound(s) are getting worse. Duration: Patient has had the wound for > 6 months prior to seeking treatment at the wound center Context: The wound would happen gradually Modifying Factors: Patient wound(s)/ulcer(s) are worsening due to :continual drainage from the wound Associated Signs and Symptoms: Patient reports having increase discharge. This patient returns after being seen here till the end of August and he was lost to follow-up. he has been quite debilitated laying in bed most of the time and his condition  not kerlix Avoid standing for long periods of time Elevate legs to the level of the heart or above for 30 minutes daily and/or when sitting, a frequency of: - throughout the day Radiology ordered were: X-ray, heel, right - non healing wound The following medication(s) was prescribed: Santyl topical 250 unit/gram ointment ointment topical to wound on right heel change daily starting 06/30/2019 1. Change the primary dressing to Santyl to the right heel. If this is unaffordable they will call us back 2. Continue silver alginate to the rest on the left leg 3. I think a lot of this is a pressure issue however going over this with him does not really give me idea of exactly how this is happening. He does not have a significant PAD based on noninvasive studies Electronic Signature(s) Signed: 07/04/2019 7:03:15 AM By: Linton Ham MD Signed: 07/06/2019 5:44:18 PM By: Levan Hurst RN, BSN Previous Signature: 06/30/2019 5:48:31 PM Version By: Linton Ham MD Entered By: Levan Hurst on 07/02/2019 14:03:43 -------------------------------------------------------------------------------- SuperBill Details Patient Name: Date of Service: [name] 06/30/2019 Medical Record [id number] Patient Account Number: [id number] Date of Birth/Sex: Treating RN: [birth year] (73 y.o. 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 L97.521 Non-pressure chronic ulcer of other part of left foot limited to breakdown of skin Facility Procedures CPT4 Code: 34742595 1 Description: 6387 - DEB SUBQ TISSUE 20 SQ CM/< ICD-10 Diagnosis Description L89.613 Pressure ulcer of right heel, stage 3 Modifier: Quantity: 1 Physician Procedures CPT4 Code: 5643329 Description: 51884 - WC PHYS SUBQ TISS 20 SQ CM ICD-10 Diagnosis Description L89.613 Pressure ulcer of right heel, stage 3 Modifier: Quantity: 1 Electronic Signature(s) Signed: 06/30/2019 5:48:31 PM By: Linton Ham MD Entered By: Linton Ham on 06/30/2019 11:10:53  not kerlix Avoid standing for long periods of time Elevate legs to the level of the heart or above for 30 minutes daily and/or when sitting, a frequency of: - throughout the day Radiology ordered were: X-ray, heel, right - non healing wound The following medication(s) was prescribed: Santyl topical 250 unit/gram ointment ointment topical to wound on right heel change daily starting 06/30/2019 1. Change the primary dressing to Santyl to the right heel. If this is unaffordable they will call us back 2. Continue silver alginate to the rest on the left leg 3. I think a lot of this is a pressure issue however going over this with him does not really give me idea of exactly how this is happening. He does not have a significant PAD based on noninvasive studies Electronic Signature(s) Signed: 07/04/2019 7:03:15 AM By: Linton Ham MD Signed: 07/06/2019 5:44:18 PM By: Levan Hurst RN, BSN Previous Signature: 06/30/2019 5:48:31 PM Version By: Linton Ham MD Entered By: Levan Hurst on 07/02/2019 14:03:43 -------------------------------------------------------------------------------- SuperBill Details Patient Name: Date of Service: [name] 06/30/2019 Medical Record [id number] Patient Account Number: [id number] Date of Birth/Sex: Treating RN: [birth year] (73 y.o. 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 L97.521 Non-pressure chronic ulcer of other part of left foot limited to breakdown of skin Facility Procedures CPT4 Code: 34742595 1 Description: 6387 - DEB SUBQ TISSUE 20 SQ CM/< ICD-10 Diagnosis Description L89.613 Pressure ulcer of right heel, stage 3 Modifier: Quantity: 1 Physician Procedures CPT4 Code: 5643329 Description: 51884 - WC PHYS SUBQ TISS 20 SQ CM ICD-10 Diagnosis Description L89.613 Pressure ulcer of right heel, stage 3 Modifier: Quantity: 1 Electronic Signature(s) Signed: 06/30/2019 5:48:31 PM By: Linton Ham MD Entered By: Linton Ham on 06/30/2019 11:10:53  LUVERN, MCISAAC ([id number]) Visit Report for 06/30/2019 Debridement Details Patient Name: Date of Service: CALEL, PISARSKI 06/30/2019 8:45 AM Medical Record [id number] Patient Account Number: [id number] Date of Birth/Sex: Treating RN: 02-27-1946 (73 y.o. [name]) [name] Primary Care Provider: [name], [name] Other Clinician: Referring Provider: Treating Provider/Extender:Donnalyn Juran [name], [name] in Treatment: 167 Debridement Performed for Wound #23 Right,Lateral Calcaneus Assessment: Performed By: Physician [name], MD Debridement Type: Debridement Severity of Tissue Pre Fat layer exposed Debridement: Level of Consciousness (Pre- Awake and Alert procedure): Pre-procedure Verification/Time Out Taken: Yes - 10:21 Start Time: 10:21 Pain Control: Lidocaine 5% topical ointment Total Area Debrided (L x W): 7.3 (cm) x 1.7 (cm) = 12.41 (cm) Tissue and other material Viable, Non-Viable, Slough, Subcutaneous, Skin: Dermis , Skin: Epidermis, Slough debrided: Level: Skin/Subcutaneous Tissue Debridement Description: Excisional Instrument: Curette Bleeding: Moderate Hemostasis Achieved: Pressure End Time: 10:24 Procedural Pain: 3 Post Procedural Pain: 0 Response to Treatment: Procedure was tolerated well Level of Consciousness Awake and Alert (Post-procedure): Post Debridement Measurements of Total Wound Length: (cm) 7.3 Width: (cm) 1.7 Depth: (cm) 0.2 Volume: (cm) 1.949 Character of Wound/Ulcer Post Improved Debridement: Severity of Tissue Post Debridement: Fat layer exposed Post Procedure Diagnosis Same as Pre-procedure Electronic Signature(s) Signed: 06/30/2019 5:48:31 PM By: Linton Ham MD Signed: 07/01/2019 7:20:02 AM By: [name] RN Entered By: Linton Ham on 06/30/2019 11:06:16 -------------------------------------------------------------------------------- HPI Details Patient Name: Date of Service: [name] 06/30/2019  8:45 AM Medical Record [id number] Patient Account Number: [id number] Date of Birth/Sex: Treating RN: 09-18-1945 (73 y.o. [name] Primary Care Provider: [name], [name] Other Clinician: Referring Provider: Treating Provider/Extender:Mally Gavina [name], [name] in Treatment: 167 History of Present Illness Location: On the left and right lateral forefoot which has been there for about 6 months Quality: Patient reports No Pain. Severity: Patient states wound(s) are getting worse. Duration: Patient has had the wound for > 6 months prior to seeking treatment at the wound center Context: The wound would happen gradually Modifying Factors: Patient wound(s)/ulcer(s) are worsening due to :continual drainage from the wound Associated Signs and Symptoms: Patient reports having increase discharge. HPI Description: This patient returns after being seen here till the end of August and he was lost to follow-up. he has been quite debilitated laying in bed most of the time and his condition has deteriorated significantly. He has multiple ulcerations on the heel lateral forefoot and some of his toes. ======== Old notes: 74 year old male known to our practice when he was seen here in February and March and was lost to follow-up when he was admitted to hospital with various medical problems including coronary artery disease and a stroke. Now returns with the problem on the left forefoot where he has an ulceration and this has been there for about 6 months. 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 He has had persistent hyperglycemia given recently. Patient has a past medical history of diabetes mellitus, hypertension, combined systolic and diastolic heart failure, peripheral neuropathy, gout, cardiomyopathy with ejection fraction of about 10-15%, coronary artery disease, recent ventricular fibrillation, chronic kidney disease,  and/or when sitting, a frequency of: - throughout the day Radiology X-ray, heel, right - non healing wound - (ICD10 L89.613 - Pressure ulcer of right heel, stage 3) Patient Medications Allergies: Sulfa Notifications Medication Indication Start End Santyl 06/30/2019 DOSE topical 250 unit/gram ointment - ointment topical to wound on right heel change daily Electronic Signature(s) Signed: 06/30/2019 10:30:10 AM By: Linton Ham MD Entered By: Linton Ham on 06/30/2019 10:30:09 -------------------------------------------------------------------------------- Prescription 06/30/2019 Patient Name: [name] Provider: Linton Ham MD Date of Birth: 1945/12/04 NPI#: [id number] Sex: M DEA#: [id number] Phone #: [phone] License #: [id number] Patient Address: [address] [place], [place] [address] [place], [place] [address] [phone] Allergies Sulfa Provider's Orders X-ray, heel, right - ICD10: O67.124 - non healing wound Signature(s): Date(s): Electronic Signature(s) Signed: 06/30/2019 5:48:31 PM By: Linton Ham MD Entered By: Linton Ham on 06/30/2019 10:30:10 --------------------------------------------------------------------------------  Problem List Details Patient Name: Date of Service: [name] 06/30/2019 8:45 AM Medical Record [id number] Patient Account Number: [id number] Date of Birth/Sex: Treating RN: 08-Feb-1946 (73 y.o. Staci Acosta, Morey Hummingbird Primary Care Provider: Other Clinician: [name], [name] Referring Provider: Treating Provider/Extender:Zoiey Christy [name], [name] in  Treatment: 167 Active Problems ICD-10 Evaluated Encounter Code Description Active Date Today Diagnosis L89.613 Pressure ulcer of right heel, stage 3 04/17/2016 No Yes L97.221 Non-pressure chronic ulcer of left calf limited to 08/15/2017 No Yes breakdown of skin L97.321 Non-pressure chronic ulcer of left ankle limited to 07/01/2018 No Yes breakdown of skin L97.521 Non-pressure chronic ulcer of other part of left foot 02/17/2019 No Yes limited to breakdown of skin Inactive Problems ICD-10 Code Description Active Date Inactive Date E11.621 Type 2 diabetes mellitus with foot ulcer 04/17/2016 04/17/2016 L03.032 Cellulitis of left toe 01/06/2019 01/06/2019 J67.341 Pressure ulcer of left heel, stage 3 12/04/2016 12/04/2016 I25.119 Atherosclerotic heart disease of native coronary artery with 04/17/2016 04/17/2016 unspecified angina pectoris L97.821 Non-pressure chronic ulcer of other part of left lower leg limited 01/06/2019 01/06/2019 to breakdown of skin S51.811D Laceration without foreign body of right forearm, subsequent 10/22/2017 10/22/2017 encounter P37.90 Acute diastolic (congestive) heart failure 04/17/2016 04/17/2016 L03.116 Cellulitis of left lower limb 12/24/2017 12/24/2017 Resolved Problems ICD-10 Code Description Active Date Resolved Date L89.512 Pressure ulcer of right ankle, stage 2 04/17/2016 04/17/2016 W40.973 Pressure ulcer of left ankle, stage 2 04/17/2016 04/17/2016 Electronic Signature(s) Signed: 06/30/2019 5:48:31 PM By: Linton Ham MD Entered By: Linton Ham on 06/30/2019 11:05:52 -------------------------------------------------------------------------------- Progress Note Details Patient Name: Date of Service: [name] 06/30/2019 8:45 AM Medical Record [id number] Patient Account Number: [id number] Date of Birth/Sex: Treating RN: 28-Apr-1945 (73 y.o. [name] Primary Care Provider: [name], [name] Other Clinician: Referring Provider: Treating  Provider/Extender:Adelis Docter [name], [name] in Treatment: 167 Subjective History of Present Illness (HPI) The following HPI elements were documented for the patient's wound: Location: On the left and right lateral forefoot which has been there for about 6 months Quality: Patient reports No Pain. Severity: Patient states wound(s) are getting worse. Duration: Patient has had the wound for > 6 months prior to seeking treatment at the wound center Context: The wound would happen gradually Modifying Factors: Patient wound(s)/ulcer(s) are worsening due to :continual drainage from the wound Associated Signs and Symptoms: Patient reports having increase discharge. This patient returns after being seen here till the end of August and he was lost to follow-up. he has been quite debilitated laying in bed most of the time and his condition  not kerlix Avoid standing for long periods of time Elevate legs to the level of the heart or above for 30 minutes daily and/or when sitting, a frequency of: - throughout the day Radiology ordered were: X-ray, heel, right - non healing wound The following medication(s) was prescribed: Santyl topical 250 unit/gram ointment ointment topical to wound on right heel change daily starting 06/30/2019 1. Change the primary dressing to Santyl to the right heel. If this is unaffordable they will call us back 2. Continue silver alginate to the rest on the left leg 3. I think a lot of this is a pressure issue however going over this with him does not really give me idea of exactly how this is happening. He does not have a significant PAD based on noninvasive studies Electronic Signature(s) Signed: 07/04/2019 7:03:15 AM By: Linton Ham MD Signed: 07/06/2019 5:44:18 PM By: Levan Hurst RN, BSN Previous Signature: 06/30/2019 5:48:31 PM Version By: Linton Ham MD Entered By: Levan Hurst on 07/02/2019 14:03:43 -------------------------------------------------------------------------------- SuperBill Details Patient Name: Date of Service: [name] 06/30/2019 Medical Record [id number] Patient Account Number: [id number] Date of Birth/Sex: Treating RN: [birth year] (73 y.o. 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 L97.521 Non-pressure chronic ulcer of other part of left foot limited to breakdown of skin Facility Procedures CPT4 Code: 34742595 1 Description: 6387 - DEB SUBQ TISSUE 20 SQ CM/< ICD-10 Diagnosis Description L89.613 Pressure ulcer of right heel, stage 3 Modifier: Quantity: 1 Physician Procedures CPT4 Code: 5643329 Description: 51884 - WC PHYS SUBQ TISS 20 SQ CM ICD-10 Diagnosis Description L89.613 Pressure ulcer of right heel, stage 3 Modifier: Quantity: 1 Electronic Signature(s) Signed: 06/30/2019 5:48:31 PM By: Linton Ham MD Entered By: Linton Ham on 06/30/2019 11:10:53  not kerlix Avoid standing for long periods of time Elevate legs to the level of the heart or above for 30 minutes daily and/or when sitting, a frequency of: - throughout the day Radiology ordered were: X-ray, heel, right - non healing wound The following medication(s) was prescribed: Santyl topical 250 unit/gram ointment ointment topical to wound on right heel change daily starting 06/30/2019 1. Change the primary dressing to Santyl to the right heel. If this is unaffordable they will call us back 2. Continue silver alginate to the rest on the left leg 3. I think a lot of this is a pressure issue however going over this with him does not really give me idea of exactly how this is happening. He does not have a significant PAD based on noninvasive studies Electronic Signature(s) Signed: 07/04/2019 7:03:15 AM By: Linton Ham MD Signed: 07/06/2019 5:44:18 PM By: Levan Hurst RN, BSN Previous Signature: 06/30/2019 5:48:31 PM Version By: Linton Ham MD Entered By: Levan Hurst on 07/02/2019 14:03:43 -------------------------------------------------------------------------------- SuperBill Details Patient Name: Date of Service: [name] 06/30/2019 Medical Record [id number] Patient Account Number: [id number] Date of Birth/Sex: Treating RN: [birth year] (73 y.o. 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 L97.521 Non-pressure chronic ulcer of other part of left foot limited to breakdown of skin Facility Procedures CPT4 Code: [id number] 1 Description: 6387 - DEB SUBQ TISSUE 20 SQ CM/< ICD-10 Diagnosis Description L89.613 Pressure ulcer of right heel, stage 3 Modifier: Quantity: 1 Physician Procedures CPT4 Code: 5643329 Description: 51884 - WC PHYS SUBQ TISS 20 SQ CM ICD-10 Diagnosis Description L89.613 Pressure ulcer of right heel, stage 3 Modifier: Quantity: 1 Electronic Signature(s) Signed: 06/30/2019 5:48:31 PM By: Linton Ham MD Entered By: Linton Ham on 06/30/2019 11:10:53

## 2019-07-01 NOTE — Telephone Encounter (Signed)
Amadeo Garnet Key: [id number] - PA Case ID: [id number] Need help? Call us at [phone] Status Sent to [place] 100UNIT/ML pen-injectors Form [employer] Electronic PA Form

## 2019-07-03 ENCOUNTER — Other Ambulatory Visit: Payer: Self-pay | Admitting: Family Medicine

## 2019-07-03 NOTE — Telephone Encounter (Signed)
Amadeo Garnet Key: [id number] - PA Case ID: [id number] Need help? Call us at [phone] Outcome Deniedon March 10 Your provider has decided to change your prescription to one of [employer] covered alternatives. The originally requested drug is not on [employer] preferred drug list (formulary). Approval of the originally requested drug (called a formulary exception) requires that you first try some of the preferred drugs on your [facility] drug list (formulary), or tell us if they were/are not right for you. The drugs on [employer] drug list include: Fiasp FlexTouch U-100 Insulin subcutaneous pen OR Fiasp U-100 Insulin subcutaneous solution (any Fiasp Formulation is acceptable). Your providers statement didnt say the drugs on [employer] drug list: wont or havent worked, or that they would cause a bad drug reaction or incorrect usage. Your original request doesnt meet the coverage criteria for a formulary exception, but your provider has decided to change your prescription to one of [employer] covered alternatives. Some preferred drugs may require an additional review and approval by [facility] [place]. Please discuss this letter, and the new drug, with your provider. This determination was based on the [place] [employer] Non-Formulary Exceptions Coverage Policy. Drug HumaLOG KwikPen 100UNIT/ML pen-injectors Form [employer] Electronic PA Form    **Rx was change for a Rx that was covered*

## 2019-07-20 ENCOUNTER — Other Ambulatory Visit: Payer: Self-pay | Admitting: Family Medicine

## 2019-07-21 ENCOUNTER — Encounter (HOSPITAL_BASED_OUTPATIENT_CLINIC_OR_DEPARTMENT_OTHER): Payer: Medicare HMO | Admitting: Internal Medicine

## 2019-07-21 ENCOUNTER — Other Ambulatory Visit: Payer: Self-pay

## 2019-07-21 DIAGNOSIS — L97321 Non-pressure chronic ulcer of left ankle limited to breakdown of skin: Secondary | ICD-10-CM | POA: Diagnosis not present

## 2019-07-21 DIAGNOSIS — L89613 Pressure ulcer of right heel, stage 3: Secondary | ICD-10-CM | POA: Diagnosis not present

## 2019-07-21 DIAGNOSIS — I252 Old myocardial infarction: Secondary | ICD-10-CM | POA: Diagnosis not present

## 2019-07-21 DIAGNOSIS — L97412 Non-pressure chronic ulcer of right heel and midfoot with fat layer exposed: Secondary | ICD-10-CM | POA: Diagnosis not present

## 2019-07-21 DIAGNOSIS — L97522 Non-pressure chronic ulcer of other part of left foot with fat layer exposed: Secondary | ICD-10-CM | POA: Diagnosis not present

## 2019-07-21 DIAGNOSIS — Z8673 Personal history of transient ischemic attack (TIA), and cerebral infarction without residual deficits: Secondary | ICD-10-CM | POA: Diagnosis not present

## 2019-07-21 DIAGNOSIS — L97812 Non-pressure chronic ulcer of other part of right lower leg with fat layer exposed: Secondary | ICD-10-CM | POA: Diagnosis not present

## 2019-07-21 DIAGNOSIS — L97521 Non-pressure chronic ulcer of other part of left foot limited to breakdown of skin: Secondary | ICD-10-CM | POA: Diagnosis not present

## 2019-07-21 DIAGNOSIS — I872 Venous insufficiency (chronic) (peripheral): Secondary | ICD-10-CM | POA: Diagnosis not present

## 2019-07-21 DIAGNOSIS — I5042 Chronic combined systolic (congestive) and diastolic (congestive) heart failure: Secondary | ICD-10-CM | POA: Diagnosis not present

## 2019-07-21 DIAGNOSIS — E1122 Type 2 diabetes mellitus with diabetic chronic kidney disease: Secondary | ICD-10-CM | POA: Diagnosis not present

## 2019-07-21 DIAGNOSIS — L97221 Non-pressure chronic ulcer of left calf limited to breakdown of skin: Secondary | ICD-10-CM | POA: Diagnosis not present

## 2019-07-21 DIAGNOSIS — I2510 Atherosclerotic heart disease of native coronary artery without angina pectoris: Secondary | ICD-10-CM | POA: Diagnosis not present

## 2019-07-22 ENCOUNTER — Other Ambulatory Visit: Payer: Self-pay | Admitting: Family Medicine

## 2019-07-27 NOTE — Progress Notes (Signed)
[name] [name], [name] [name] ([id number]) Visit Report for 07/21/2019 Arrival Information Details Patient Name: Date of Service: [name] [name], [name] [name] 07/21/2019 9:15 AM [name] [id number] Patient Account Number: [id number] Date of Birth/Sex: Treating RN: Jan 04, 1946 (74 y.o. [name] [name]: [name] [name] Other Clinician: Referring [name] [name]: Treating [name] [name]/Extender:[name] [name], [name] Bradford in Treatment: 170 Visit Information History Since Last Visit Added or deleted any medications: No Patient Arrived: Wheel Chair Any new allergies or adverse reactions: No Arrival Time: 09:33 Had a fall or experienced change in No Accompanied By: daughter activities of daily living that may affect Transfer Assistance: Manual risk of falls: Patient Identification Verified: Yes Signs or symptoms of abuse/neglect since last No Secondary Verification Process Yes visito Completed: Hospitalized since last visit: No Patient Requires Transmission- No Implantable device outside of the clinic excluding No Based Precautions: cellular tissue based products placed in the center Patient Has Alerts: Yes Patient on Blood since last visit: Patient Alerts: Has Dressing in Place as Prescribed: Yes Thinner left ABI 1.0; rt ABI Pain Present Now: No 1.09 Electronic Signature(s) Signed: 07/27/2019 4:55:48 PM By: Kela Millin Entered By: Kela Millin on 07/21/2019 09:38:41 -------------------------------------------------------------------------------- Encounter Discharge Information Details Patient Name: Date of Service: [name] [name] 07/21/2019 9:15 AM [name] [id number] Patient Account Number: [id number] Date of Birth/Sex: Treating RN: September 22, 1945 (74 y.o. [name] [name] Primary Care Beza Steppe: [name] [name] Other Clinician: Referring [name] [name]: Treating [name] [name]/Extender:[name] [name], [name] Bradford in Treatment: [id number] Encounter  Discharge Information Items Post Procedure Vitals Discharge Condition: Stable Temperature (F): 98.1 Ambulatory Status: Wheelchair Pulse (bpm): 79 Discharge Destination: Home Respiratory Rate (breaths/min): 19 Transportation: Private Auto Blood Pressure (mmHg): 101/59 Accompanied By: daughter Schedule Follow-up Appointment: Yes Clinical Summary of Care: Patient Declined Electronic Signature(s) Signed: 07/27/2019 4:55:48 PM By: Kela Millin Entered By: Kela Millin on 07/21/2019 11:52:21 -------------------------------------------------------------------------------- Lower Extremity Assessment Details Patient Name: Date of Service: [name] [name], [name] [name] 07/21/2019 9:15 AM [name] [id number] Patient Account Number: [id number] Date of Birth/Sex: Treating RN: 08/23/1945 (74 y.o. [name] [name] Primary Care [name] [name]: [name] [name] Other Clinician: Referring Ahmeer Tuman: Treating Preslei Blakley/Extender:[name] [name], [name] Bradford in Treatment: 170 Edema Assessment Assessed: [Left: No] [Right: No] Edema: [Left: No] [Right: Yes] Calf Left: Right: Point of Measurement: 36 cm From Medial Instep 20.5 cm 28 cm Ankle Left: Right: Point of Measurement: 11 cm From Medial Instep 17 cm 20.5 cm Vascular Assessment Pulses: Dorsalis Pedis Palpable: [Left:Yes] [Right:Yes] Electronic Signature(s) Signed: 07/27/2019 4:55:48 PM By: Kela Millin Entered By: Kela Millin on 07/21/2019 09:44:43 -------------------------------------------------------------------------------- Multi Wound Chart Details Patient Name: Date of Service: [name] [name] 07/21/2019 9:15 AM [name] [id number] Patient Account Number: [id number] Date of Birth/Sex: Treating RN: 1945-06-20 (74 y.o. [name] Primary Care Navdeep Fessenden: Other Clinician: [name], [name] Referring [name] [name]: Treating [name] [name]/Extender:[name] [name], [name] Bradford in Treatment: 170 Vital  Signs Height(in): 74 Capillary Blood 117 Glucose(mg/dl): Weight(lbs): 150 Pulse(bpm): 74 Body Mass Index(BMI): 19 Blood Pressure(mmHg): 101/59 Temperature(F): 98.1 Respiratory 19 Rate(breaths/min): Photos: [14:No Photos] [18:No Photos] [23:No Photos] Wound Location: [14:Left Lower Leg - Lateral] [18:Left Foot - Dorsal] [23:Right Calcaneus - Lateral] Wounding Event: [14:Gradually Appeared] [18:Gradually Appeared] [23:Pressure Injury] 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  Treatment Notes Wound #23 (Right, Lateral Calcaneus) 1. Cleanse With Wound Cleanser 2. Periwound Care TCA Cream 3. Primary Dressing Applied Calcium Alginate Ag 4. Secondary Dressing Dry Gauze 6. Support Layer Applied Kerlix/Coban Notes cotton and Event organiser) Signed: 07/27/2019 4:55:48 PM By: Kela Millin Entered By: Kela Millin on 07/21/2019 09:52:37 -------------------------------------------------------------------------------- Wound Assessment Details Patient Name: Date of Service: [name] [name], [name] [name] 07/21/2019 9:15 AM [name] [id number] Patient Account Number: [id number] Date of Birth/Sex: Treating RN: 1945/12/26 (73 y.o. [name] [name] Primary Care Ramey Ketcherside: [name] [name] Other Clinician: Referring [name] [name]: Treating [name] [name] [name], [name] Bradford in Treatment: 170 Wound Status Wound Number: 24 Primary Pressure Ulcer Etiology: Wound Location: Left Calcaneus Wound Open Wounding Event: Pressure Injury Status: Date Acquired: 06/30/2019 Comorbid Anemia, Sleep Apnea, Arrhythmia, Congestive Weeks Of Treatment: 3 History: Heart Failure, Coronary Artery Disease, Clustered Wound: No Hypertension, Myocardial Infarction, Type II Diabetes, Gout, Neuropathy Photos Photo Uploaded By: Mikeal Hawthorne on 07/27/2019 12:44:37 Wound Measurements Length: (cm) 2 Width: (cm) 2.1 Depth: (cm) 0.1 Area: (cm) 3.299 Volume: (cm) 0.33 Wound Description Classification: Unstageable/Unclassified Wound Margin: Distinct, outline  attached Exudate Amount: Small Exudate Type: Serous Exudate Color: amber Wound Bed Granulation Amount: Medium (34-66%) Granulation Quality: Pink Necrotic Amount: Medium (34-66%) Necrotic Quality: Adherent Slough fter Cleansing: No ino Yes Exposed Structure Subcutaneous Tissue) Exposed: Yes % Reduction in Area: 76% % Reduction in Volume: 76% Epithelialization: None Tunneling: No Undermining: No Foul Odor A Slough/Fibr Fat Layer ( Treatment Notes Wound #24 (Left Calcaneus) 1. Cleanse With Wound Cleanser Soap and water 2. Periwound Care TCA Cream 3. Primary Dressing Applied Calcium Alginate Ag Santyl 4. Secondary Dressing Dry Gauze Heel Cup 6. Support Layer Applied Kerlix/Coban Notes santyl to heel, cotton and Event organiser) Signed: 07/27/2019 4:55:48 PM By: Kela Millin Entered By: Kela Millin on 07/21/2019 09:52:00 -------------------------------------------------------------------------------- Wound Assessment Details Patient Name: Date of Service: [name] [name], [name] [name] 07/21/2019 9:15 AM [name] [id number] Patient Account Number: [id number] Date of Birth/Sex: Treating RN: [birth year] (74 y.o. [name] [name] Primary Care Adena Sima: [name] [name] Other Clinician: Referring Hue Frick: Treating Delories Mauri/Extender:[name] [name], [name] Bradford in Treatment: 170 Wound Status Wound Number: 25 Primary Venous Leg Ulcer Etiology: Wound Location: Left Lower Leg - Lateral Wound Open Wounding Event: Gradually Appeared Status: Date Acquired: 07/06/2019 Comorbid Anemia, Sleep Apnea, Arrhythmia, Congestive Weeks Of Treatment: 0 History: Heart Failure, Coronary Artery Disease, Clustered Wound: No Hypertension, Myocardial Infarction, Type II Diabetes, Gout, Neuropathy Photos Photo Uploaded By: Mikeal Hawthorne on 07/27/2019 12:45:10 Wound Measurements Length: (cm) 2.9 % Reductio Width: (cm) 1.2 % Reductio Depth: (cm) 0.1  Epitheli Area: (cm) 2.733 Tunneli Volume: (cm) 0.273 Undermi Wound Description Full Thickness Without Exposed Support Foul Odo Classification: Structures Slough/F Wound Distinct, outline attached Margin: Exudate Small Amount: Exudate Serous Type: Exudate amber Color: Wound Bed Granulation Amount: None Present (0%) Necrotic Amount: Large (67-100%) Fascia E Necrotic Quality: Eschar, Adherent Slough Fat Laye Tendon E Muscle E Joint Ex Bone Exp r After Cleansing: No ibrino Yes Exposed Structure xposed: No r (Subcutaneous Tissue) Exposed: No xposed: No xposed: No posed: No osed: No n in Area: n in Volume: alization: None ng: No ning: No Electronic Signature(s) Signed: 07/27/2019 4:55:48 PM By: Kela Millin Entered By: Kela Millin on 07/21/2019 09:47:44 -------------------------------------------------------------------------------- Vitals Details Patient Name: Date of Service: [name] [name] 07/21/2019 9:15 AM [name] [id number] Patient Account Number: [id number] Date of Birth/Sex: Treating RN: 24-May-1945 (74 y.o. [name] [name] Primary Care Dezire Turk: [name] [name] Other Clinician: Referring Elva Breaker: Treating Laterica Matarazzo/Extender:[name],  Treatment Notes Wound #23 (Right, Lateral Calcaneus) 1. Cleanse With Wound Cleanser 2. Periwound Care TCA Cream 3. Primary Dressing Applied Calcium Alginate Ag 4. Secondary Dressing Dry Gauze 6. Support Layer Applied Kerlix/Coban Notes cotton and Event organiser) Signed: 07/27/2019 4:55:48 PM By: Kela Millin Entered By: Kela Millin on 07/21/2019 09:52:37 -------------------------------------------------------------------------------- Wound Assessment Details Patient Name: Date of Service: [name] [name], [name] [name] 07/21/2019 9:15 AM [name] [id number] Patient Account Number: [id number] Date of Birth/Sex: Treating RN: 1945/12/26 (73 y.o. [name] [name] Primary Care Ramey Ketcherside: [name] [name] Other Clinician: Referring [name] [name]: Treating [name] [name] [name], [name] Bradford in Treatment: 170 Wound Status Wound Number: 24 Primary Pressure Ulcer Etiology: Wound Location: Left Calcaneus Wound Open Wounding Event: Pressure Injury Status: Date Acquired: 06/30/2019 Comorbid Anemia, Sleep Apnea, Arrhythmia, Congestive Weeks Of Treatment: 3 History: Heart Failure, Coronary Artery Disease, Clustered Wound: No Hypertension, Myocardial Infarction, Type II Diabetes, Gout, Neuropathy Photos Photo Uploaded By: Mikeal Hawthorne on 07/27/2019 12:44:37 Wound Measurements Length: (cm) 2 Width: (cm) 2.1 Depth: (cm) 0.1 Area: (cm) 3.299 Volume: (cm) 0.33 Wound Description Classification: Unstageable/Unclassified Wound Margin: Distinct, outline  attached Exudate Amount: Small Exudate Type: Serous Exudate Color: amber Wound Bed Granulation Amount: Medium (34-66%) Granulation Quality: Pink Necrotic Amount: Medium (34-66%) Necrotic Quality: Adherent Slough fter Cleansing: No ino Yes Exposed Structure Subcutaneous Tissue) Exposed: Yes % Reduction in Area: 76% % Reduction in Volume: 76% Epithelialization: None Tunneling: No Undermining: No Foul Odor A Slough/Fibr Fat Layer ( Treatment Notes Wound #24 (Left Calcaneus) 1. Cleanse With Wound Cleanser Soap and water 2. Periwound Care TCA Cream 3. Primary Dressing Applied Calcium Alginate Ag Santyl 4. Secondary Dressing Dry Gauze Heel Cup 6. Support Layer Applied Kerlix/Coban Notes santyl to heel, cotton and Event organiser) Signed: 07/27/2019 4:55:48 PM By: Kela Millin Entered By: Kela Millin on 07/21/2019 09:52:00 -------------------------------------------------------------------------------- Wound Assessment Details Patient Name: Date of Service: [name] [name], [name] [name] 07/21/2019 9:15 AM [name] [id number] Patient Account Number: [id number] Date of Birth/Sex: Treating RN: [birth year] (74 y.o. [name] [name] Primary Care Adena Sima: [name] [name] Other Clinician: Referring Hue Frick: Treating Delories Mauri/Extender:[name] [name], [name] Bradford in Treatment: 170 Wound Status Wound Number: 25 Primary Venous Leg Ulcer Etiology: Wound Location: Left Lower Leg - Lateral Wound Open Wounding Event: Gradually Appeared Status: Date Acquired: 07/06/2019 Comorbid Anemia, Sleep Apnea, Arrhythmia, Congestive Weeks Of Treatment: 0 History: Heart Failure, Coronary Artery Disease, Clustered Wound: No Hypertension, Myocardial Infarction, Type II Diabetes, Gout, Neuropathy Photos Photo Uploaded By: Mikeal Hawthorne on 07/27/2019 12:45:10 Wound Measurements Length: (cm) 2.9 % Reductio Width: (cm) 1.2 % Reductio Depth: (cm) 0.1  Epitheli Area: (cm) 2.733 Tunneli Volume: (cm) 0.273 Undermi Wound Description Full Thickness Without Exposed Support Foul Odo Classification: Structures Slough/F Wound Distinct, outline attached Margin: Exudate Small Amount: Exudate Serous Type: Exudate amber Color: Wound Bed Granulation Amount: None Present (0%) Necrotic Amount: Large (67-100%) Fascia E Necrotic Quality: Eschar, Adherent Slough Fat Laye Tendon E Muscle E Joint Ex Bone Exp r After Cleansing: No ibrino Yes Exposed Structure xposed: No r (Subcutaneous Tissue) Exposed: No xposed: No xposed: No posed: No osed: No n in Area: n in Volume: alization: None ng: No ning: No Electronic Signature(s) Signed: 07/27/2019 4:55:48 PM By: Kela Millin Entered By: Kela Millin on 07/21/2019 09:47:44 -------------------------------------------------------------------------------- Vitals Details Patient Name: Date of Service: [name] [name] 07/21/2019 9:15 AM [name] [id number] Patient Account Number: [id number] Date of Birth/Sex: Treating RN: 24-May-1945 (74 y.o. [name] [name] Primary Care Dezire Turk: [name] [name] Other Clinician: Referring Elva Breaker: Treating Laterica Matarazzo/Extender:[name],  [name] [name], [name] [name] ([id number]) Visit Report for 07/21/2019 Arrival Information Details Patient Name: Date of Service: [name] [name], [name] [name] 07/21/2019 9:15 AM [name] [id number] Patient Account Number: [id number] Date of Birth/Sex: Treating RN: Jan 04, 1946 (74 y.o. [name] [name]: [name] [name] Other Clinician: Referring [name] [name]: Treating [name] [name]/Extender:[name] [name], [name] Bradford in Treatment: 170 Visit Information History Since Last Visit Added or deleted any medications: No Patient Arrived: Wheel Chair Any new allergies or adverse reactions: No Arrival Time: 09:33 Had a fall or experienced change in No Accompanied By: daughter activities of daily living that may affect Transfer Assistance: Manual risk of falls: Patient Identification Verified: Yes Signs or symptoms of abuse/neglect since last No Secondary Verification Process Yes visito Completed: Hospitalized since last visit: No Patient Requires Transmission- No Implantable device outside of the clinic excluding No Based Precautions: cellular tissue based products placed in the center Patient Has Alerts: Yes Patient on Blood since last visit: Patient Alerts: Has Dressing in Place as Prescribed: Yes Thinner left ABI 1.0; rt ABI Pain Present Now: No 1.09 Electronic Signature(s) Signed: 07/27/2019 4:55:48 PM By: Kela Millin Entered By: Kela Millin on 07/21/2019 09:38:41 -------------------------------------------------------------------------------- Encounter Discharge Information Details Patient Name: Date of Service: [name] [name] 07/21/2019 9:15 AM [name] [id number] Patient Account Number: [id number] Date of Birth/Sex: Treating RN: September 22, 1945 (74 y.o. [name] [name] Primary Care Beza Steppe: [name] [name] Other Clinician: Referring [name] [name]: Treating [name] [name]/Extender:[name] [name], [name] Bradford in Treatment: [id number] Encounter  Discharge Information Items Post Procedure Vitals Discharge Condition: Stable Temperature (F): 98.1 Ambulatory Status: Wheelchair Pulse (bpm): 79 Discharge Destination: Home Respiratory Rate (breaths/min): 19 Transportation: Private Auto Blood Pressure (mmHg): 101/59 Accompanied By: daughter Schedule Follow-up Appointment: Yes Clinical Summary of Care: Patient Declined Electronic Signature(s) Signed: 07/27/2019 4:55:48 PM By: Kela Millin Entered By: Kela Millin on 07/21/2019 11:52:21 -------------------------------------------------------------------------------- Lower Extremity Assessment Details Patient Name: Date of Service: [name] [name], [name] [name] 07/21/2019 9:15 AM [name] [id number] Patient Account Number: [id number] Date of Birth/Sex: Treating RN: 08/23/1945 (74 y.o. [name] [name] Primary Care [name] [name]: [name] [name] Other Clinician: Referring Ahmeer Tuman: Treating Preslei Blakley/Extender:[name] [name], [name] Bradford in Treatment: 170 Edema Assessment Assessed: [Left: No] [Right: No] Edema: [Left: No] [Right: Yes] Calf Left: Right: Point of Measurement: 36 cm From Medial Instep 20.5 cm 28 cm Ankle Left: Right: Point of Measurement: 11 cm From Medial Instep 17 cm 20.5 cm Vascular Assessment Pulses: Dorsalis Pedis Palpable: [Left:Yes] [Right:Yes] Electronic Signature(s) Signed: 07/27/2019 4:55:48 PM By: Kela Millin Entered By: Kela Millin on 07/21/2019 09:44:43 -------------------------------------------------------------------------------- Multi Wound Chart Details Patient Name: Date of Service: [name] [name] 07/21/2019 9:15 AM [name] [id number] Patient Account Number: [id number] Date of Birth/Sex: Treating RN: 1945-06-20 (74 y.o. [name] Primary Care Navdeep Fessenden: Other Clinician: [name], [name] Referring [name] [name]: Treating [name] [name]/Extender:[name] [name], [name] Bradford in Treatment: 170 Vital  Signs Height(in): 74 Capillary Blood 117 Glucose(mg/dl): Weight(lbs): 150 Pulse(bpm): 74 Body Mass Index(BMI): 19 Blood Pressure(mmHg): 101/59 Temperature(F): 98.1 Respiratory 19 Rate(breaths/min): Photos: [14:No Photos] [18:No Photos] [23:No Photos] Wound Location: [14:Left Lower Leg - Lateral] [18:Left Foot - Dorsal] [23:Right Calcaneus - Lateral] Wounding Event: [14:Gradually Appeared] [18:Gradually Appeared] [23:Pressure Injury] 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  [name] [name], [name] [name] ([id number]) Visit Report for 07/21/2019 Arrival Information Details Patient Name: Date of Service: [name] [name], [name] [name] 07/21/2019 9:15 AM [name] [id number] Patient Account Number: [id number] Date of Birth/Sex: Treating RN: Jan 04, 1946 (74 y.o. [name] [name]: [name] [name] Other Clinician: Referring [name] [name]: Treating [name] [name]/Extender:[name] [name], [name] Bradford in Treatment: 170 Visit Information History Since Last Visit Added or deleted any medications: No Patient Arrived: Wheel Chair Any new allergies or adverse reactions: No Arrival Time: 09:33 Had a fall or experienced change in No Accompanied By: daughter activities of daily living that may affect Transfer Assistance: Manual risk of falls: Patient Identification Verified: Yes Signs or symptoms of abuse/neglect since last No Secondary Verification Process Yes visito Completed: Hospitalized since last visit: No Patient Requires Transmission- No Implantable device outside of the clinic excluding No Based Precautions: cellular tissue based products placed in the center Patient Has Alerts: Yes Patient on Blood since last visit: Patient Alerts: Has Dressing in Place as Prescribed: Yes Thinner left ABI 1.0; rt ABI Pain Present Now: No 1.09 Electronic Signature(s) Signed: 07/27/2019 4:55:48 PM By: Kela Millin Entered By: Kela Millin on 07/21/2019 09:38:41 -------------------------------------------------------------------------------- Encounter Discharge Information Details Patient Name: Date of Service: [name] [name] 07/21/2019 9:15 AM [name] [id number] Patient Account Number: [id number] Date of Birth/Sex: Treating RN: September 22, 1945 (74 y.o. [name] [name] Primary Care Beza Steppe: [name] [name] Other Clinician: Referring [name] [name]: Treating [name] [name]/Extender:[name] [name], [name] Bradford in Treatment: [id number] Encounter  Discharge Information Items Post Procedure Vitals Discharge Condition: Stable Temperature (F): 98.1 Ambulatory Status: Wheelchair Pulse (bpm): 79 Discharge Destination: Home Respiratory Rate (breaths/min): 19 Transportation: Private Auto Blood Pressure (mmHg): 101/59 Accompanied By: daughter Schedule Follow-up Appointment: Yes Clinical Summary of Care: Patient Declined Electronic Signature(s) Signed: 07/27/2019 4:55:48 PM By: Kela Millin Entered By: Kela Millin on 07/21/2019 11:52:21 -------------------------------------------------------------------------------- Lower Extremity Assessment Details Patient Name: Date of Service: [name] [name], [name] [name] 07/21/2019 9:15 AM [name] [id number] Patient Account Number: [id number] Date of Birth/Sex: Treating RN: 08/23/1945 (74 y.o. [name] [name] Primary Care [name] [name]: [name] [name] Other Clinician: Referring Ahmeer Tuman: Treating Preslei Blakley/Extender:[name] [name], [name] Bradford in Treatment: 170 Edema Assessment Assessed: [Left: No] [Right: No] Edema: [Left: No] [Right: Yes] Calf Left: Right: Point of Measurement: 36 cm From Medial Instep 20.5 cm 28 cm Ankle Left: Right: Point of Measurement: 11 cm From Medial Instep 17 cm 20.5 cm Vascular Assessment Pulses: Dorsalis Pedis Palpable: [Left:Yes] [Right:Yes] Electronic Signature(s) Signed: 07/27/2019 4:55:48 PM By: Kela Millin Entered By: Kela Millin on 07/21/2019 09:44:43 -------------------------------------------------------------------------------- Multi Wound Chart Details Patient Name: Date of Service: [name] [name] 07/21/2019 9:15 AM [name] [id number] Patient Account Number: [id number] Date of Birth/Sex: Treating RN: 1945-06-20 (74 y.o. [name] Primary Care Navdeep Fessenden: Other Clinician: [name], [name] Referring [name] [name]: Treating [name] [name]/Extender:[name] [name], [name] Bradford in Treatment: 170 Vital  Signs Height(in): 74 Capillary Blood 117 Glucose(mg/dl): Weight(lbs): 150 Pulse(bpm): 74 Body Mass Index(BMI): 19 Blood Pressure(mmHg): 101/59 Temperature(F): 98.1 Respiratory 19 Rate(breaths/min): Photos: [14:No Photos] [18:No Photos] [23:No Photos] Wound Location: [14:Left Lower Leg - Lateral] [18:Left Foot - Dorsal] [23:Right Calcaneus - Lateral] Wounding Event: [14:Gradually Appeared] [18:Gradually Appeared] [23:Pressure Injury] 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  [name] [name], [name] [name] ([id number]) Visit Report for 07/21/2019 Arrival Information Details Patient Name: Date of Service: [name] [name], [name] [name] 07/21/2019 9:15 AM [name] [id number] Patient Account Number: [id number] Date of Birth/Sex: Treating RN: Jan 04, 1946 (74 y.o. [name] [name]: [name] [name] Other Clinician: Referring [name] [name]: Treating [name] [name]/Extender:[name] [name], [name] Bradford in Treatment: 170 Visit Information History Since Last Visit Added or deleted any medications: No Patient Arrived: Wheel Chair Any new allergies or adverse reactions: No Arrival Time: 09:33 Had a fall or experienced change in No Accompanied By: daughter activities of daily living that may affect Transfer Assistance: Manual risk of falls: Patient Identification Verified: Yes Signs or symptoms of abuse/neglect since last No Secondary Verification Process Yes visito Completed: Hospitalized since last visit: No Patient Requires Transmission- No Implantable device outside of the clinic excluding No Based Precautions: cellular tissue based products placed in the center Patient Has Alerts: Yes Patient on Blood since last visit: Patient Alerts: Has Dressing in Place as Prescribed: Yes Thinner left ABI 1.0; rt ABI Pain Present Now: No 1.09 Electronic Signature(s) Signed: 07/27/2019 4:55:48 PM By: Kela Millin Entered By: Kela Millin on 07/21/2019 09:38:41 -------------------------------------------------------------------------------- Encounter Discharge Information Details Patient Name: Date of Service: [name] [name] 07/21/2019 9:15 AM [name] [id number] Patient Account Number: [id number] Date of Birth/Sex: Treating RN: September 22, 1945 (74 y.o. [name] [name] Primary Care Beza Steppe: [name] [name] Other Clinician: Referring [name] [name]: Treating [name] [name]/Extender:[name] [name], [name] Bradford in Treatment: [id number] Encounter  Discharge Information Items Post Procedure Vitals Discharge Condition: Stable Temperature (F): 98.1 Ambulatory Status: Wheelchair Pulse (bpm): 79 Discharge Destination: Home Respiratory Rate (breaths/min): 19 Transportation: Private Auto Blood Pressure (mmHg): 101/59 Accompanied By: daughter Schedule Follow-up Appointment: Yes Clinical Summary of Care: Patient Declined Electronic Signature(s) Signed: 07/27/2019 4:55:48 PM By: Kela Millin Entered By: Kela Millin on 07/21/2019 11:52:21 -------------------------------------------------------------------------------- Lower Extremity Assessment Details Patient Name: Date of Service: [name] [name], [name] [name] 07/21/2019 9:15 AM [name] [id number] Patient Account Number: [id number] Date of Birth/Sex: Treating RN: 08/23/1945 (74 y.o. [name] [name] Primary Care [name] [name]: [name] [name] Other Clinician: Referring Ahmeer Tuman: Treating Preslei Blakley/Extender:[name] [name], [name] Bradford in Treatment: 170 Edema Assessment Assessed: [Left: No] [Right: No] Edema: [Left: No] [Right: Yes] Calf Left: Right: Point of Measurement: 36 cm From Medial Instep 20.5 cm 28 cm Ankle Left: Right: Point of Measurement: 11 cm From Medial Instep 17 cm 20.5 cm Vascular Assessment Pulses: Dorsalis Pedis Palpable: [Left:Yes] [Right:Yes] Electronic Signature(s) Signed: 07/27/2019 4:55:48 PM By: Kela Millin Entered By: Kela Millin on 07/21/2019 09:44:43 -------------------------------------------------------------------------------- Multi Wound Chart Details Patient Name: Date of Service: [name] [name] 07/21/2019 9:15 AM [name] [id number] Patient Account Number: [id number] Date of Birth/Sex: Treating RN: 1945-06-20 (74 y.o. [name] Primary Care Navdeep Fessenden: Other Clinician: [name], [name] Referring [name] [name]: Treating [name] [name]/Extender:[name] [name], [name] Bradford in Treatment: 170 Vital  Signs Height(in): 74 Capillary Blood 117 Glucose(mg/dl): Weight(lbs): 150 Pulse(bpm): 74 Body Mass Index(BMI): 19 Blood Pressure(mmHg): 101/59 Temperature(F): 98.1 Respiratory 19 Rate(breaths/min): Photos: [14:No Photos] [18:No Photos] [23:No Photos] Wound Location: [14:Left Lower Leg - Lateral] [18:Left Foot - Dorsal] [23:Right Calcaneus - Lateral] Wounding Event: [14:Gradually Appeared] [18:Gradually Appeared] [23:Pressure Injury] 

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
## 2019-07-28 ENCOUNTER — Encounter (HOSPITAL_BASED_OUTPATIENT_CLINIC_OR_DEPARTMENT_OTHER): Payer: Medicare HMO | Admitting: Internal Medicine

## 2019-07-28 ENCOUNTER — Telehealth: Payer: Self-pay | Admitting: Family Medicine

## 2019-07-28 DIAGNOSIS — N312 Flaccid neuropathic bladder, not elsewhere classified: Secondary | ICD-10-CM | POA: Diagnosis not present

## 2019-07-28 NOTE — Chronic Care Management (AMB) (Signed)
  Chronic Care Management   Note  07/28/2019 Name: [name] [name] MRN: [id number] DOB: May 14, 1945  [name] [name] is a 74 y.o. year old male who is a primary care patient of [name], Malka So, MD. I reached out to [name] by phone today in response to a referral sent by [name]'s PCP, [name], [name] G, MD.   [name] was given information about Chronic Care Management services today including:  1. CCM service includes personalized support from designated clinical staff supervised by his physician, including individualized plan of care and coordination with other care providers 2. 24/7 contact phone numbers for assistance for urgent and routine care needs. 3. Service will only be billed when office clinical staff spend 20 minutes or more in a month to coordinate care. 4. Only one practitioner may furnish and bill the service in a calendar month. 5. The patient may stop CCM services at any time (effective at the end of the month) by phone call to the office staff.   Patient agreed to services and verbal consent obtained.   Follow up plan:   Raynicia Dukes [employer] Scheduler

## 2019-08-04 ENCOUNTER — Other Ambulatory Visit: Payer: Self-pay

## 2019-08-04 ENCOUNTER — Encounter (HOSPITAL_BASED_OUTPATIENT_CLINIC_OR_DEPARTMENT_OTHER): Payer: Medicare HMO | Attending: Internal Medicine | Admitting: Internal Medicine

## 2019-08-04 DIAGNOSIS — L89613 Pressure ulcer of right heel, stage 3: Secondary | ICD-10-CM | POA: Diagnosis not present

## 2019-08-04 DIAGNOSIS — D649 Anemia, unspecified: Secondary | ICD-10-CM | POA: Insufficient documentation

## 2019-08-04 DIAGNOSIS — L97221 Non-pressure chronic ulcer of left calf limited to breakdown of skin: Secondary | ICD-10-CM | POA: Diagnosis not present

## 2019-08-04 DIAGNOSIS — L97211 Non-pressure chronic ulcer of right calf limited to breakdown of skin: Secondary | ICD-10-CM | POA: Insufficient documentation

## 2019-08-04 DIAGNOSIS — E1140 Type 2 diabetes mellitus with diabetic neuropathy, unspecified: Secondary | ICD-10-CM | POA: Insufficient documentation

## 2019-08-04 DIAGNOSIS — I110 Hypertensive heart disease with heart failure: Secondary | ICD-10-CM | POA: Insufficient documentation

## 2019-08-04 DIAGNOSIS — L97412 Non-pressure chronic ulcer of right heel and midfoot with fat layer exposed: Secondary | ICD-10-CM | POA: Diagnosis not present

## 2019-08-04 DIAGNOSIS — G4730 Sleep apnea, unspecified: Secondary | ICD-10-CM | POA: Diagnosis not present

## 2019-08-04 DIAGNOSIS — L89620 Pressure ulcer of left heel, unstageable: Secondary | ICD-10-CM | POA: Diagnosis not present

## 2019-08-04 DIAGNOSIS — E11621 Type 2 diabetes mellitus with foot ulcer: Secondary | ICD-10-CM | POA: Insufficient documentation

## 2019-08-04 DIAGNOSIS — L97521 Non-pressure chronic ulcer of other part of left foot limited to breakdown of skin: Secondary | ICD-10-CM | POA: Insufficient documentation

## 2019-08-04 DIAGNOSIS — E1151 Type 2 diabetes mellitus with diabetic peripheral angiopathy without gangrene: Secondary | ICD-10-CM | POA: Insufficient documentation

## 2019-08-04 DIAGNOSIS — M109 Gout, unspecified: Secondary | ICD-10-CM | POA: Diagnosis not present

## 2019-08-04 DIAGNOSIS — L97321 Non-pressure chronic ulcer of left ankle limited to breakdown of skin: Secondary | ICD-10-CM | POA: Insufficient documentation

## 2019-08-04 DIAGNOSIS — I2510 Atherosclerotic heart disease of native coronary artery without angina pectoris: Secondary | ICD-10-CM | POA: Diagnosis not present

## 2019-08-04 DIAGNOSIS — I252 Old myocardial infarction: Secondary | ICD-10-CM | POA: Insufficient documentation

## 2019-08-04 DIAGNOSIS — L97812 Non-pressure chronic ulcer of other part of right lower leg with fat layer exposed: Secondary | ICD-10-CM | POA: Diagnosis not present

## 2019-08-04 DIAGNOSIS — I872 Venous insufficiency (chronic) (peripheral): Secondary | ICD-10-CM | POA: Diagnosis not present

## 2019-08-04 NOTE — Progress Notes (Signed)
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 dressing every day. Wound #25 Right,Lateral Lower Leg: Change dressing every day. Wound #24 Left Calcaneus: Change dressing three times week. Skin Barriers/Peri-Wound Care: Other: - TCA to left lower extrimity Wound Cleansing: Clean wound with Normal Saline. May shower with protection. Primary Wound Dressing: Wound #14 Left,Lateral Lower Leg: Polymem Silver Wound #18 Left,Dorsal Foot: Polymem Silver Wound #23 Right,Lateral Calcaneus: Santyl Ointment Wound #24 Left Calcaneus: Polymem Silver Wound #25 Right,Lateral Lower Leg: Santyl Ointment Secondary Dressing: Dry Gauze Other: - apply a triple antibiotic ointment and bandaid to left elbow skin tear. Edema Control: Kerlix and Coban - Bilateral - use cotton roll not kerlix Avoid standing for long periods of time Elevate legs to the level of the heart or above for 30 minutes daily and/or when sitting, a frequency of: - throughout the day Off-Loading: Multipodus Splint to: - patient to wear bunny boots to both feet while resting in chair and bed. 1. On the right we are going to use Santyl to both wounds 2. On the left polymen Ag 3. We have used PolyMem Ag in the past however we have also gone through a lot of different dressings. 4. I going to have our intake nurse look at the way the dressing is done when he comes in next time. Electronic Signature(s) Signed: 08/04/2019 5:51:00 PM By: Linton Ham MD Entered By: Linton Ham on 08/04/2019  11:26:22 -------------------------------------------------------------------------------- SuperBill Details Patient Name: Date of Service: [name] 08/04/2019 Medical Record [id number] Patient Account Number: [id number] Date of Birth/Sex: Treating RN: 1946-04-17 (74 y.o. 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 L97.521 Non-pressure chronic ulcer of other part of left foot limited to breakdown of skin L89.620 Pressure ulcer of left heel, unstageable L97.211 Non-pressure chronic ulcer of right calf limited to breakdown of skin Facility Procedures CPT4 Code Description: [id number] 11042 - DEB SUBQ TISSUE 20 SQ CM/< ICD-10 Diagnosis Description L89.613 Pressure ulcer of right heel, stage 3 L97.211 Non-pressure chronic ulcer of right calf limited to breakdo Modifier: wn of skin Quantity: 1 Physician Procedures CPT4 Code Description: [id number] 36629 - WC PHYS SUBQ TISS 20 SQ CM ICD-10 Diagnosis Description L89.613 Pressure ulcer of right heel, stage 3 L97.211 Non-pressure chronic ulcer of right calf limited to brea Modifier: kdown of skin Quantity: 1 Electronic Signature(s) Signed: 08/04/2019 5:51:00 PM By: Linton Ham MD Entered By: Linton Ham on 08/04/2019 11:26:45  the left great toe. He comes in this week with excoriations right down his anterior tibia. He is unaware of how this happened. Some erythema around the left lateral calf although no evidence of cellulitis He had arterial studies that suggested only mild arterial disease. I am not sure whether he has had reflux studies or not. I will need to research this 04/28/2019; patient comes in today with improved areas on the left calf and the dorsal left foot/ankle. However he has a reopening on the right lateral calcaneus. 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 He is mystified about how this would have happened although looking at how he lies in the examining table is exactly how this would have happened although he claims he uses bunny boots at night etc. We have been using Sorbact to all wound areas. He had a significant excoriated rash on his left leg when he was here last time. We changed from Kerlix to cotton under the Coban and this seems to have helped 2/2; 1 month follow-up. The patient has open areas on the left lateral tibial area mid lower leg, left dorsal foot/ankle and an area on the right lateral calcaneus. The right lateral calcaneus is better as is the left dorsal foot/ankle. The area on the left lateral tibia however is larger. 3/9; 1 month follow-up. The left lateral tibia looks mostly the same. Left dorsal foot and ankle  improved and the area on the right lateral calcaneus is quite a bit worse. He has had previous arterial studies last year that were fairly normal. I continue to think that these are pressure areas. We have been using silver alginate on all the wounds 3/30; 3-week follow-up. The left lateral tibia is still open but with less width. Left dorsal foot small punched out wound. He has a large area on his right lateral calcaneus and an area that I did not pick up on last time on the tip of the left calcaneus As well he has a new area on the right lateral calf which was new today. 4/13; this is a patient with multiple medical problems. He has been in this clinic for a very prolonged period of time with different wounds in different areas in his lower extremities. Currently we have the left lateral tibia, left heel, left dorsal foot, right lateral heel and right lateral lower tibia. We have been using Santyl on the right and silver alginate on the left He has his daughter who changes his dressings. The reason behind the nonhealing here and ulcer recurrence is really never been clear to me. The patient is debilitated. He uses bunny boots apparently at night to protect his heels and feet. His arterial studies have previously not suggested a primary arterial etiology Electronic Signature(s) Signed: 08/04/2019 5:51:00 PM By: Linton Ham MD Entered By: Linton Ham on 08/04/2019 11:21:42 -------------------------------------------------------------------------------- Physical Exam Details Patient Name: Date of Service: [name] 08/04/2019 9:15 AM Medical Record [id number] Patient Account Number: [id number] Date of Birth/Sex: Treating RN: [birth year] (74 y.o. [name] [name] Primary Care Provider: [name], [name] Other Clinician: Referring Provider: Treating Provider/Extender:Daily Doe [name], [name] in Treatment: 172 Constitutional Sitting or standing Blood Pressure is  within target range for patient.. Pulse regular and within target range for patient.Marland Kitchen Respirations regular, non-labored and within target range.. Temperature is normal and within the target range for the patient.Marland Kitchen Appears  the left great toe. He comes in this week with excoriations right down his anterior tibia. He is unaware of how this happened. Some erythema around the left lateral calf although no evidence of cellulitis He had arterial studies that suggested only mild arterial disease. I am not sure whether he has had reflux studies or not. I will need to research this 04/28/2019; patient comes in today with improved areas on the left calf and the dorsal left foot/ankle. However he has a reopening on the right lateral calcaneus. 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 He is mystified about how this would have happened although looking at how he lies in the examining table is exactly how this would have happened although he claims he uses bunny boots at night etc. We have been using Sorbact to all wound areas. He had a significant excoriated rash on his left leg when he was here last time. We changed from Kerlix to cotton under the Coban and this seems to have helped 2/2; 1 month follow-up. The patient has open areas on the left lateral tibial area mid lower leg, left dorsal foot/ankle and an area on the right lateral calcaneus. The right lateral calcaneus is better as is the left dorsal foot/ankle. The area on the left lateral tibia however is larger. 3/9; 1 month follow-up. The left lateral tibia looks mostly the same. Left dorsal foot and ankle  improved and the area on the right lateral calcaneus is quite a bit worse. He has had previous arterial studies last year that were fairly normal. I continue to think that these are pressure areas. We have been using silver alginate on all the wounds 3/30; 3-week follow-up. The left lateral tibia is still open but with less width. Left dorsal foot small punched out wound. He has a large area on his right lateral calcaneus and an area that I did not pick up on last time on the tip of the left calcaneus As well he has a new area on the right lateral calf which was new today. 4/13; this is a patient with multiple medical problems. He has been in this clinic for a very prolonged period of time with different wounds in different areas in his lower extremities. Currently we have the left lateral tibia, left heel, left dorsal foot, right lateral heel and right lateral lower tibia. We have been using Santyl on the right and silver alginate on the left He has his daughter who changes his dressings. The reason behind the nonhealing here and ulcer recurrence is really never been clear to me. The patient is debilitated. He uses bunny boots apparently at night to protect his heels and feet. His arterial studies have previously not suggested a primary arterial etiology Electronic Signature(s) Signed: 08/04/2019 5:51:00 PM By: Linton Ham MD Entered By: Linton Ham on 08/04/2019 11:21:42 -------------------------------------------------------------------------------- Physical Exam Details Patient Name: Date of Service: [name] 08/04/2019 9:15 AM Medical Record [id number] Patient Account Number: [id number] Date of Birth/Sex: Treating RN: [birth year] (74 y.o. [name] [name] Primary Care Provider: [name], [name] Other Clinician: Referring Provider: Treating Provider/Extender:Daily Doe [name], [name] in Treatment: 172 Constitutional Sitting or standing Blood Pressure is  within target range for patient.. Pulse regular and within target range for patient.Marland Kitchen Respirations regular, non-labored and within target range.. Temperature is normal and within the target range for the patient.Marland Kitchen Appears  improved. Severity of Tissue Post Debridement is: Fat layer exposed. Post procedure Diagnosis Wound #25: Same as Pre-Procedure Plan Follow-up Appointments: Return Appointment in 2 weeks. Dressing Change Frequency: Change dressing three times week. Wound #14 Left,Lateral Lower Leg: Change dressing three times week. Wound #18 Left,Dorsal Foot: Change dressing three times week. Wound #23 Right,Lateral Calcaneus: Change dressing every day. Wound #25 Right,Lateral Lower Leg: Change dressing every day. Wound #24 Left Calcaneus: Change dressing three times week. Skin Barriers/Peri-Wound Care: Other: - TCA to left lower extrimity Wound Cleansing: Clean wound with Normal Saline. May shower with protection. Primary Wound Dressing: Wound #14 Left,Lateral Lower Leg: Polymem Silver Wound #18 Left,Dorsal Foot: Polymem Silver Wound #23 Right,Lateral Calcaneus: Santyl Ointment Wound #24 Left Calcaneus: Polymem Silver Wound #25 Right,Lateral Lower Leg: Santyl Ointment Secondary Dressing: Dry Gauze Other: - apply a triple antibiotic ointment and bandaid to left elbow skin tear. Edema Control: Kerlix and Coban - Bilateral - use cotton roll not kerlix Avoid standing for long periods of time Elevate legs to the level of the heart or above for 30 minutes daily and/or when sitting, a frequency of: - throughout the day Off-Loading: Multipodus Splint to: - patient to wear bunny boots to both feet while resting in chair and bed. 1. On the right we are going to use Santyl to both wounds 2. On the left polymen Ag 3. We have used PolyMem Ag in the past however we have also gone through a lot of different dressings. 4. I going to have our intake nurse look at the way the dressing is done when he comes in next time. Electronic Signature(s) Signed: 08/04/2019 5:51:00 PM By: Linton Ham MD Entered By: Linton Ham on 08/04/2019  11:26:22 -------------------------------------------------------------------------------- SuperBill Details Patient Name: Date of Service: [name] 08/04/2019 Medical Record [id number] Patient Account Number: [id number] Date of Birth/Sex: Treating RN: 1946-04-17 (74 y.o. 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 L97.521 Non-pressure chronic ulcer of other part of left foot limited to breakdown of skin L89.620 Pressure ulcer of left heel, unstageable L97.211 Non-pressure chronic ulcer of right calf limited to breakdown of skin Facility Procedures CPT4 Code Description: [id number] 11042 - DEB SUBQ TISSUE 20 SQ CM/< ICD-10 Diagnosis Description L89.613 Pressure ulcer of right heel, stage 3 L97.211 Non-pressure chronic ulcer of right calf limited to breakdo Modifier: wn of skin Quantity: 1 Physician Procedures CPT4 Code Description: [id number] 36629 - WC PHYS SUBQ TISS 20 SQ CM ICD-10 Diagnosis Description L89.613 Pressure ulcer of right heel, stage 3 L97.211 Non-pressure chronic ulcer of right calf limited to brea Modifier: kdown of skin Quantity: 1 Electronic Signature(s) Signed: 08/04/2019 5:51:00 PM By: Linton Ham MD Entered By: Linton Ham on 08/04/2019 11:26:45  improved. Severity of Tissue Post Debridement is: Fat layer exposed. Post procedure Diagnosis Wound #25: Same as Pre-Procedure Plan Follow-up Appointments: Return Appointment in 2 weeks. Dressing Change Frequency: Change dressing three times week. Wound #14 Left,Lateral Lower Leg: Change dressing three times week. Wound #18 Left,Dorsal Foot: Change dressing three times week. Wound #23 Right,Lateral Calcaneus: Change dressing every day. Wound #25 Right,Lateral Lower Leg: Change dressing every day. Wound #24 Left Calcaneus: Change dressing three times week. Skin Barriers/Peri-Wound Care: Other: - TCA to left lower extrimity Wound Cleansing: Clean wound with Normal Saline. May shower with protection. Primary Wound Dressing: Wound #14 Left,Lateral Lower Leg: Polymem Silver Wound #18 Left,Dorsal Foot: Polymem Silver Wound #23 Right,Lateral Calcaneus: Santyl Ointment Wound #24 Left Calcaneus: Polymem Silver Wound #25 Right,Lateral Lower Leg: Santyl Ointment Secondary Dressing: Dry Gauze Other: - apply a triple antibiotic ointment and bandaid to left elbow skin tear. Edema Control: Kerlix and Coban - Bilateral - use cotton roll not kerlix Avoid standing for long periods of time Elevate legs to the level of the heart or above for 30 minutes daily and/or when sitting, a frequency of: - throughout the day Off-Loading: Multipodus Splint to: - patient to wear bunny boots to both feet while resting in chair and bed. 1. On the right we are going to use Santyl to both wounds 2. On the left polymen Ag 3. We have used PolyMem Ag in the past however we have also gone through a lot of different dressings. 4. I going to have our intake nurse look at the way the dressing is done when he comes in next time. Electronic Signature(s) Signed: 08/04/2019 5:51:00 PM By: Linton Ham MD Entered By: Linton Ham on 08/04/2019  11:26:22 -------------------------------------------------------------------------------- SuperBill Details Patient Name: Date of Service: [name] 08/04/2019 Medical Record [id number] Patient Account Number: [id number] Date of Birth/Sex: Treating RN: 1946-04-17 (74 y.o. 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 L97.521 Non-pressure chronic ulcer of other part of left foot limited to breakdown of skin L89.620 Pressure ulcer of left heel, unstageable L97.211 Non-pressure chronic ulcer of right calf limited to breakdown of skin Facility Procedures CPT4 Code Description: [id number] 11042 - DEB SUBQ TISSUE 20 SQ CM/< ICD-10 Diagnosis Description L89.613 Pressure ulcer of right heel, stage 3 L97.211 Non-pressure chronic ulcer of right calf limited to breakdo Modifier: wn of skin Quantity: 1 Physician Procedures CPT4 Code Description: [id number] 36629 - WC PHYS SUBQ TISS 20 SQ CM ICD-10 Diagnosis Description L89.613 Pressure ulcer of right heel, stage 3 L97.211 Non-pressure chronic ulcer of right calf limited to brea Modifier: kdown of skin Quantity: 1 Electronic Signature(s) Signed: 08/04/2019 5:51:00 PM By: Linton Ham MD Entered By: Linton Ham on 08/04/2019 11:26:45  improved. Severity of Tissue Post Debridement is: Fat layer exposed. Post procedure Diagnosis Wound #25: Same as Pre-Procedure Plan Follow-up Appointments: Return Appointment in 2 weeks. Dressing Change Frequency: Change dressing three times week. Wound #14 Left,Lateral Lower Leg: Change dressing three times week. Wound #18 Left,Dorsal Foot: Change dressing three times week. Wound #23 Right,Lateral Calcaneus: Change dressing every day. Wound #25 Right,Lateral Lower Leg: Change dressing every day. Wound #24 Left Calcaneus: Change dressing three times week. Skin Barriers/Peri-Wound Care: Other: - TCA to left lower extrimity Wound Cleansing: Clean wound with Normal Saline. May shower with protection. Primary Wound Dressing: Wound #14 Left,Lateral Lower Leg: Polymem Silver Wound #18 Left,Dorsal Foot: Polymem Silver Wound #23 Right,Lateral Calcaneus: Santyl Ointment Wound #24 Left Calcaneus: Polymem Silver Wound #25 Right,Lateral Lower Leg: Santyl Ointment Secondary Dressing: Dry Gauze Other: - apply a triple antibiotic ointment and bandaid to left elbow skin tear. Edema Control: Kerlix and Coban - Bilateral - use cotton roll not kerlix Avoid standing for long periods of time Elevate legs to the level of the heart or above for 30 minutes daily and/or when sitting, a frequency of: - throughout the day Off-Loading: Multipodus Splint to: - patient to wear bunny boots to both feet while resting in chair and bed. 1. On the right we are going to use Santyl to both wounds 2. On the left polymen Ag 3. We have used PolyMem Ag in the past however we have also gone through a lot of different dressings. 4. I going to have our intake nurse look at the way the dressing is done when he comes in next time. Electronic Signature(s) Signed: 08/04/2019 5:51:00 PM By: Linton Ham MD Entered By: Linton Ham on 08/04/2019  11:26:22 -------------------------------------------------------------------------------- SuperBill Details Patient Name: Date of Service: [name] 08/04/2019 Medical Record [id number] Patient Account Number: [id number] Date of Birth/Sex: Treating RN: 1946-04-17 (74 y.o. 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 L97.521 Non-pressure chronic ulcer of other part of left foot limited to breakdown of skin L89.620 Pressure ulcer of left heel, unstageable L97.211 Non-pressure chronic ulcer of right calf limited to breakdown of skin Facility Procedures CPT4 Code Description: [id number] 11042 - DEB SUBQ TISSUE 20 SQ CM/< ICD-10 Diagnosis Description L89.613 Pressure ulcer of right heel, stage 3 L97.211 Non-pressure chronic ulcer of right calf limited to breakdo Modifier: wn of skin Quantity: 1 Physician Procedures CPT4 Code Description: [id number] 36629 - WC PHYS SUBQ TISS 20 SQ CM ICD-10 Diagnosis Description L89.613 Pressure ulcer of right heel, stage 3 L97.211 Non-pressure chronic ulcer of right calf limited to brea Modifier: kdown of skin Quantity: 1 Electronic Signature(s) Signed: 08/04/2019 5:51:00 PM By: Linton Ham MD Entered By: Linton Ham on 08/04/2019 11:26:45

## 2019-08-05 DIAGNOSIS — L97221 Non-pressure chronic ulcer of left calf limited to breakdown of skin: Secondary | ICD-10-CM | POA: Diagnosis not present

## 2019-08-05 DIAGNOSIS — L89512 Pressure ulcer of right ankle, stage 2: Secondary | ICD-10-CM | POA: Diagnosis not present

## 2019-08-05 DIAGNOSIS — L97521 Non-pressure chronic ulcer of other part of left foot limited to breakdown of skin: Secondary | ICD-10-CM | POA: Diagnosis not present

## 2019-08-05 DIAGNOSIS — L89620 Pressure ulcer of left heel, unstageable: Secondary | ICD-10-CM | POA: Diagnosis not present

## 2019-08-05 DIAGNOSIS — L97321 Non-pressure chronic ulcer of left ankle limited to breakdown of skin: Secondary | ICD-10-CM | POA: Diagnosis not present

## 2019-08-05 DIAGNOSIS — L89623 Pressure ulcer of left heel, stage 3: Secondary | ICD-10-CM | POA: Diagnosis not present

## 2019-08-05 DIAGNOSIS — L89522 Pressure ulcer of left ankle, stage 2: Secondary | ICD-10-CM | POA: Diagnosis not present

## 2019-08-05 DIAGNOSIS — L89613 Pressure ulcer of right heel, stage 3: Secondary | ICD-10-CM | POA: Diagnosis not present

## 2019-08-05 DIAGNOSIS — L97211 Non-pressure chronic ulcer of right calf limited to breakdown of skin: Secondary | ICD-10-CM | POA: Diagnosis not present

## 2019-08-05 NOTE — Progress Notes (Signed)
0 X - Complex Wound Cleansing - multiple wounds 5 5 X - Wound Imaging (photographs - any number of wounds) 1 5 []  - Wound Tracing (instead of photographs) 0 []  - Simple Wound Measurement - one wound 0 X - Complex Wound Measurement - multiple wounds 5 5 INTERVENTIONS - Wound Dressings []  - Small Wound Dressing one or multiple wounds 0 []  - Medium Wound Dressing one or multiple wounds 0 X - Large Wound Dressing one or multiple wounds 2 20 []  - Application of Medications - topical 0 []  - Application of Medications - injection 0 INTERVENTIONS - Miscellaneous []  - External ear exam 0 []  - Specimen Collection (cultures, biopsies, blood, body fluids, etc.) 0 []  - Specimen(s) / Culture(s) sent or taken to Lab for analysis 0 []  - Patient Transfer (multiple staff / Civil Service fast streamer / Similar devices) 0 []  - Simple Staple / Suture removal (25 or less) 0 []  - Complex Staple / Suture removal (26 or more) 0 []  - Hypo / Hyperglycemic Management (close monitor of Blood Glucose) 0 []  - Ankle / Brachial Index (ABI) - do not check if billed separately 0 X - Vital Signs 1 5 Has the patient been seen at the hospital within the last three years: Yes Total Score: 215 Level Of Care: New/Established - Level 5 Electronic Signature(s) Signed: 08/04/2019 6:03:05 PM By: Deon Pilling Entered By: Deon Pilling on 08/04/2019 10:50:08 -------------------------------------------------------------------------------- Encounter Discharge Information Details Patient Name: Date of Service: [name] [name]. 08/04/2019 9:15 AM [name] [id number] Patient Account Number: [id number] Date of Birth/Sex: Treating RN: 07-Mar-1946 (74 y.o. Marvis Repress Primary Care Khole Arterburn: [name], [name] Other Clinician: Referring Travanti Mcmanus: Treating  Devora Tortorella/Extender:[name], [name] [name], [name] in Treatment: 172 Encounter Discharge Information Items Post Procedure Vitals Discharge Condition: Stable Temperature (F): 98.4 Ambulatory Status: Wheelchair Pulse (bpm): 77 Discharge Destination: Home Respiratory Rate (breaths/min): 19 Transportation: Private Auto Blood Pressure (mmHg): 107/64 Accompanied By: daughter Schedule Follow-up Appointment: Yes Clinical Summary of Care: Patient Declined Electronic Signature(s) Signed: 08/04/2019 5:52:37 PM By: Kela Millin Entered By: Kela Millin on 08/04/2019 10:57:21 -------------------------------------------------------------------------------- Lower Extremity Assessment Details Patient Name: Date of Service: [name] [name] 08/04/2019 9:15 AM [name] [id number] Patient Account Number: [id number] Date of Birth/Sex: Treating RN: 01/02/1946 (74 y.o. Marvis Repress Primary Care Naythen Heikkila: [name], [name] Other Clinician: Referring Caitlen Worth: Treating Melvie Paglia/Extender:[name], [name] [name], [name] in Treatment: 172 Edema Assessment Assessed: [Left: No] [Right: No] Edema: [Left: Yes] [Right: Yes] Calf Left: Right: Point of Measurement: 36 cm From Medial Instep 20.5 cm 28 cm Ankle Left: Right: Point of Measurement: 11 cm From Medial Instep 18 cm 20.5 cm Vascular Assessment Pulses: Dorsalis Pedis Palpable: [Left:Yes] [Right:Yes] Electronic Signature(s) Signed: 08/04/2019 5:52:37 PM By: Kela Millin Entered By: Kela Millin on 08/04/2019 10:22:24 -------------------------------------------------------------------------------- Multi Wound Chart Details Patient Name: Date of Service: [name] [name]. 08/04/2019 9:15 AM [name] [id number] Patient Account Number: [id number] Date of Birth/Sex: Treating RN: Feb 23, 1946 (74 y.o. [name] Primary Care Markala Sitts: [name], [name] Other Clinician: Referring Aydenn Gervin:  Treating Aliene Tamura/Extender:[name], [name] [name], [name] in Treatment: 172 Vital Signs Height(in): 74 Pulse(bpm): 77 Weight(lbs): 150 Blood Pressure(mmHg): 107/64 Body Mass Index(BMI): 19 Temperature(F): 98.4 Respiratory 19 Rate(breaths/min): Photos: [14:No Photos] [18:No Photos] [23:No Photos] Wound Location: [14:Left, Lateral Lower Leg] [18:Left, Dorsal Foot] [23:Right, Lateral Calcaneus] Wounding Event: [14:Gradually Appeared] [18:Gradually Appeared] [23:Pressure Injury] 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  right heel and right lateral lower leg, cotton instead of kerlix Electronic Signature(s) Signed: 08/04/2019 5:52:37 PM By: Kela Millin Signed: 08/04/2019 6:03:05 PM By: Deon Pilling Entered By: Kela Millin on 08/04/2019 10:25:08 -------------------------------------------------------------------------------- Wound Assessment Details Patient Name: Date of Service: [name] [name]. 08/04/2019 9:15 AM [name] [id number] Patient Account Number: [id number] Date of Birth/Sex: Treating RN: September 23, 1945 (74 y.o. [name] Primary Care Rider Ermis: [name], [name] Other Clinician: Referring Huan Pollok: Treating Amarra Sawyer/Extender:[name], [name] [name], [name] in Treatment: 172 Wound Status Wound Number: 25 Primary Venous Leg Ulcer Etiology: Wound Location: Right, Lateral Lower Leg Wound Open Wounding Event: Gradually Appeared Status: Date Acquired: 07/06/2019 Comorbid Anemia, Sleep Apnea, Arrhythmia, Congestive Weeks Of Treatment: 2 History: Heart Failure, Coronary Artery Disease, Clustered Wound: No Hypertension, Myocardial Infarction, Type II Diabetes, Gout, Neuropathy Wound Measurements Length: (cm) 2.7 Width: (cm) 1.1 Depth: (cm) 0.1 Area: (cm) 2.333 Volume: (cm) 0.233 Wound Description Classification: Full Thickness Without Exposed Support Structures Wound Distinct, outline attached Margin: Exudate Small Amount: Exudate Serous Type: Exudate amber Color: Wound Bed Granulation  Amount: None Present (0%) Necrotic Amount: Large (67-100%) Necrotic Quality: Eschar, Adherent Slough Foul Odor After Cleansing: No Slough/Fibrino Yes Exposed Structure Fascia Exposed: Fat Layer (Subcutaneous Tissue) Exposed: Tendon Exposed: Muscle Exposed: Joint Exposed: Bone Exposed: % Reduction in Area: 14.6% % Reduction in Volume: 14.7% Epithelialization: None Tunneling: No Undermining: No No No No No No No Treatment Notes Wound #25 (Right, Lateral Lower Leg) 1. Cleanse With Wound Cleanser Soap and water 2. Periwound Care TCA Cream 3. Primary Dressing Applied Santyl Polymem Ag 4. Secondary Dressing ABD Pad Dry Gauze 6. Support Layer Applied Kerlix/Coban Notes santyl to right heel and right lateral lower leg, cotton instead of kerlix Electronic Signature(s) Signed: 08/04/2019 5:52:37 PM By: Kela Millin Signed: 08/04/2019 6:03:05 PM By: Deon Pilling Entered By: Kela Millin on 08/04/2019 10:25:42 -------------------------------------------------------------------------------- Vitals Details Patient Name: Date of Service: [name] [name]. 08/04/2019 9:15 AM [name] [id number] Patient Account Number: [id number] Date of Birth/Sex: Treating RN: 11/09/1945 (74 y.o. [name] Primary Care Delois Tolbert: [name], [name] Other Clinician: Referring Helayna Dun: Treating Sharif Rendell/Extender:[name], [name] [name], [name] in Treatment: 172 Vital Signs Time Taken: 09:39 Temperature (F): 98.4 Height (in): 74 Pulse (bpm): 77 Weight (lbs): 150 Respiratory Rate (breaths/min): 19 Body Mass Index (BMI): 19.3 Blood Pressure (mmHg): 107/64 Reference Range: 80 - 120 mg / dl Electronic Signature(s) Signed: 08/05/2019 9:19:04 AM By: Sandre Kitty Entered By: Sandre Kitty on 08/04/2019 09:39:26  34-66%) Tunneling: No Undermining: No Treatment Notes Wound #18 (Left, Dorsal Foot) 1. Cleanse With Wound Cleanser Soap and water 2. Periwound Care TCA Cream 3. Primary Dressing Applied Santyl Polymem Ag 4. Secondary Dressing ABD Pad Dry Gauze 6. Support  Layer Applied Kerlix/Coban Notes santyl to right heel and right lateral lower leg, cotton instead of kerlix Electronic Signature(s) Signed: 08/04/2019 5:52:37 PM By: Kela Millin Signed: 08/04/2019 6:03:05 PM By: Deon Pilling Entered By: Kela Millin on 08/04/2019 10:23:24 -------------------------------------------------------------------------------- Wound Assessment Details Patient Name: Date of Service: [name] [name]. 08/04/2019 9:15 AM [name] [id number] Patient Account Number: [id number] Date of Birth/Sex: Treating RN: Sep 11, 1945 (74 y.o. [name] Primary Care Mikalah Skyles: [name], [name] Other Clinician: Referring Arieh Bogue: Treating Orville Widmann/Extender:[name], [name] [name], [name] in Treatment: 172 Wound Status Wound Number: 23 Primary Diabetic Wound/Ulcer of the Lower Extremity Etiology: Wound Location: Right, Lateral Calcaneus Secondary Pressure Ulcer Wounding Event: Pressure Injury Etiology: Date Acquired: 04/28/2019 Wound Open Weeks Of Treatment: 14 Status: Clustered Wound: No Comorbid Anemia, Sleep Apnea, Arrhythmia, History: Congestive Heart Failure, Coronary Artery Disease, Hypertension, Myocardial Infarction, Type II Diabetes, Gout, Neuropathy Wound Measurements Length: (cm) 3.5 % Width: (cm) 2 % Depth: (cm) 0.1 E Area: (cm) 5.498 Volume: (cm) 0.55 Wound Description Classification: Grade 2 Wound Margin: Flat and Intact Exudate Amount: Medium Exudate Type: Serosanguineous Exudate Color: red, brown Wound Bed Granulation Amount: Medium (34-66%) Granulation Quality: Pink Necrotic Amount: Medium (34-66%) Necrotic Quality: Eschar, Adherent Slough Foul Odor After Cleansing: No Slough/Fibrino Yes Exposed Structure Fascia Exposed: No Fat Layer (Subcutaneous Tissue) Exposed: Yes Tendon Exposed: No Muscle Exposed: No Joint Exposed: No Bone Exposed: No Reduction in Area: 6.7% Reduction in Volume:  6.6% pithelialization: Small (1-33%) Tunneling: No Undermining: No Treatment Notes Wound #23 (Right, Lateral Calcaneus) 1. Cleanse With Wound Cleanser Soap and water 2. Periwound Care TCA Cream 3. Primary Dressing Applied Santyl Polymem Ag 4. Secondary Dressing ABD Pad Dry Gauze 6. Support Layer Applied Kerlix/Coban Notes santyl to right heel and right lateral lower leg, cotton instead of kerlix Electronic Signature(s) Signed: 08/04/2019 5:52:37 PM By: Kela Millin Signed: 08/04/2019 6:03:05 PM By: Deon Pilling Entered By: Kela Millin on 08/04/2019 10:24:22 -------------------------------------------------------------------------------- Wound Assessment Details Patient Name: Date of Service: [name] [name]. 08/04/2019 9:15 AM [name] [id number] Patient Account Number: [id number] Date of Birth/Sex: Treating RN: 1945-10-09 (74 y.o. [name] Primary Care Kourtney Terriquez: [name], [name] Other Clinician: Referring Annelisa Ryback: Treating Kaeley Vinje/Extender:[name], [name] [name], [name] in Treatment: 172 Wound Status Wound Number: 24 Primary Pressure Ulcer Etiology: Wound Location: Left Calcaneus Wound Open Wounding Event: Pressure Injury Status: Date Acquired: 06/30/2019 Comorbid Anemia, Sleep Apnea, Arrhythmia, Congestive Weeks Of Treatment: 5 History: Heart Failure, Coronary Artery Disease, Clustered Wound: No Hypertension, Myocardial Infarction, Type II Diabetes, Gout, Neuropathy Wound Measurements Length: (cm) 2.5 Width: (cm) 1.4 Depth: (cm) 0.1 Area: (cm) 2.749 Volume: (cm) 0.275 Wound Description Classification: Unstageable/Unclassified Wound Margin: Distinct, outline attached Exudate Amount: Small Exudate Type: Serosanguineous Exudate Color: red, brown Wound Bed Granulation Amount: Medium (34-66%) Granulation Quality: Pink Necrotic Amount: Medium (34-66%) Necrotic Quality: Adherent Slough After Cleansing: No brino  Yes Exposed Structure (Subcutaneous Tissue) Exposed: Yes % Reduction in Area: 80% % Reduction in Volume: 80% Epithelialization: None Tunneling: No Undermining: No Foul Odor Slough/Fi Fat Layer Treatment Notes Wound #24 (Left Calcaneus) 1. Cleanse With Wound Cleanser Soap and water 2. Periwound Care TCA Cream 3. Primary Dressing Applied Santyl Polymem Ag 4. Secondary Dressing ABD Pad Dry Gauze 6. Support Layer Applied Kerlix/Coban Notes santyl to  right heel and right lateral lower leg, cotton instead of kerlix Electronic Signature(s) Signed: 08/04/2019 5:52:37 PM By: Kela Millin Signed: 08/04/2019 6:03:05 PM By: Deon Pilling Entered By: Kela Millin on 08/04/2019 10:25:08 -------------------------------------------------------------------------------- Wound Assessment Details Patient Name: Date of Service: [name] [name]. 08/04/2019 9:15 AM [name] [id number] Patient Account Number: [id number] Date of Birth/Sex: Treating RN: September 23, 1945 (74 y.o. [name] Primary Care Rider Ermis: [name], [name] Other Clinician: Referring Huan Pollok: Treating Amarra Sawyer/Extender:[name], [name] [name], [name] in Treatment: 172 Wound Status Wound Number: 25 Primary Venous Leg Ulcer Etiology: Wound Location: Right, Lateral Lower Leg Wound Open Wounding Event: Gradually Appeared Status: Date Acquired: 07/06/2019 Comorbid Anemia, Sleep Apnea, Arrhythmia, Congestive Weeks Of Treatment: 2 History: Heart Failure, Coronary Artery Disease, Clustered Wound: No Hypertension, Myocardial Infarction, Type II Diabetes, Gout, Neuropathy Wound Measurements Length: (cm) 2.7 Width: (cm) 1.1 Depth: (cm) 0.1 Area: (cm) 2.333 Volume: (cm) 0.233 Wound Description Classification: Full Thickness Without Exposed Support Structures Wound Distinct, outline attached Margin: Exudate Small Amount: Exudate Serous Type: Exudate amber Color: Wound Bed Granulation  Amount: None Present (0%) Necrotic Amount: Large (67-100%) Necrotic Quality: Eschar, Adherent Slough Foul Odor After Cleansing: No Slough/Fibrino Yes Exposed Structure Fascia Exposed: Fat Layer (Subcutaneous Tissue) Exposed: Tendon Exposed: Muscle Exposed: Joint Exposed: Bone Exposed: % Reduction in Area: 14.6% % Reduction in Volume: 14.7% Epithelialization: None Tunneling: No Undermining: No No No No No No No Treatment Notes Wound #25 (Right, Lateral Lower Leg) 1. Cleanse With Wound Cleanser Soap and water 2. Periwound Care TCA Cream 3. Primary Dressing Applied Santyl Polymem Ag 4. Secondary Dressing ABD Pad Dry Gauze 6. Support Layer Applied Kerlix/Coban Notes santyl to right heel and right lateral lower leg, cotton instead of kerlix Electronic Signature(s) Signed: 08/04/2019 5:52:37 PM By: Kela Millin Signed: 08/04/2019 6:03:05 PM By: Deon Pilling Entered By: Kela Millin on 08/04/2019 10:25:42 -------------------------------------------------------------------------------- Vitals Details Patient Name: Date of Service: [name] [name]. 08/04/2019 9:15 AM [name] [id number] Patient Account Number: [id number] Date of Birth/Sex: Treating RN: 11/09/1945 (74 y.o. [name] Primary Care Delois Tolbert: [name], [name] Other Clinician: Referring Helayna Dun: Treating Sharif Rendell/Extender:[name], [name] [name], [name] in Treatment: 172 Vital Signs Time Taken: 09:39 Temperature (F): 98.4 Height (in): 74 Pulse (bpm): 77 Weight (lbs): 150 Respiratory Rate (breaths/min): 19 Body Mass Index (BMI): 19.3 Blood Pressure (mmHg): 107/64 Reference Range: 80 - 120 mg / dl Electronic Signature(s) Signed: 08/05/2019 9:19:04 AM By: Sandre Kitty Entered By: Sandre Kitty on 08/04/2019 09:39:26  Debrided: [14:N/A] [18:Subcutaneous, Slough] [23:N/A] Level: [14:N/A] [18:Skin/Subcutaneous Tissue] [23:N/A] Debridement Area (sq cm):N/A [18:2.97] [23:N/A] Instrument: [14:N/A] [18:Curette] [23:N/A] Bleeding: [14:N/A] [18:Minimum] [23:N/A] Hemostasis Achieved: [14:N/A] [18:Pressure] [23:N/A] Procedural Pain: [14:N/A] [18:0] [23:N/A] Post Procedural Pain: [14:N/A] [18:0] [23:N/A] Debridement Treatment N/A [18:Procedure was tolerated] [23:N/A] Response: [18:well] Post Debridement [14:N/A] [18:2.7x1.1x0.1] [23:N/A] Measurements L x W x D (cm) Post Debridement [14:N/A] [14:7.829] [23:N/A] Volume: (cm) Procedures Performed: N/A [18:Debridement] [23:N/A] Treatment Notes Wound #14 (Left, Lateral Lower Leg) 1. Cleanse With Wound Cleanser Soap and water 2. Periwound Care TCA Cream 3. Primary Dressing Applied Santyl Polymem Ag 4. Secondary Dressing ABD Pad Dry Gauze 6. Support Layer Applied Kerlix/Coban Notes santyl to right heel and right lateral lower leg, cotton instead of kerlix Wound #18 (Left, Dorsal Foot) 1. Cleanse With Wound Cleanser Soap and water 2. Periwound Care TCA Cream 3. Primary Dressing Applied Santyl Polymem Ag 4. Secondary Dressing ABD Pad Dry Gauze 6. Support Layer Applied Kerlix/Coban Notes santyl to right heel and right lateral lower leg, cotton instead of kerlix Wound #23 (Right, Lateral Calcaneus) 1. Cleanse With Wound Cleanser Soap and water 2. Periwound Care TCA Cream 3. Primary Dressing Applied Santyl Polymem Ag 4. Secondary Dressing ABD Pad Dry Gauze 6. Support Layer Applied Kerlix/Coban Notes santyl to right heel and right lateral lower leg, cotton instead of kerlix Wound #24 (Left Calcaneus) 1. Cleanse With Wound Cleanser Soap and water 2. Periwound Care TCA Cream 3. Primary Dressing Applied Santyl Polymem  Ag 4. Secondary Dressing ABD Pad Dry Gauze 6. Support Layer Applied Kerlix/Coban Notes santyl to right heel and right lateral lower leg, cotton instead of kerlix Wound #25 (Right, Lateral Lower Leg) 1. Cleanse With Wound Cleanser Soap and water 2. Periwound Care TCA Cream 3. Primary Dressing Applied Santyl Polymem Ag 4. Secondary Dressing ABD Pad Dry Gauze 6. Support Layer Applied Kerlix/Coban Notes santyl to right heel and right lateral lower leg, cotton instead of kerlix Electronic Signature(s) Signed: 08/04/2019 5:51:00 PM By: Linton Ham MD Signed: 08/04/2019 6:03:05 PM By: Deon Pilling Entered By: Linton Ham on 08/04/2019 11:18:24 -------------------------------------------------------------------------------- Multi-Disciplinary Care Plan Details Patient Name: Date of Service: [name] [name]. 08/04/2019 9:15 AM [name] [id number] Patient Account Number: [id number] Date of Birth/Sex: Treating RN: 1945/09/16 (74 y.o. [name] Primary Care Tine Mabee: [name], [name] Other Clinician: Referring Regnia Mathwig: Treating Pippa Hanif/Extender:[name], [name] [name], [name] in Treatment: 172 Active Inactive Wound/Skin Impairment Nursing Diagnoses: Impaired tissue integrity Goals: Patient/caregiver will verbalize understanding of skin care regimen Date Initiated: 11/19/2017 Target Resolution Date: 09/04/2019 Goal Status: Active Ulcer/skin breakdown will have a volume reduction of 30% by week 4 Date Initiated: 11/19/2017 Date Inactivated: 12/02/2017 Target Resolution Date: 12/22/2017 Unmet Reason: multipl Goal Status: Unmet comorbidities Interventions: Assess patient/caregiver ability to obtain necessary supplies Assess ulceration(s) every visit Provide education on ulcer and skin care Notes: Electronic Signature(s) Signed: 08/04/2019 6:03:05 PM By: Deon Pilling Entered By: Deon Pilling on 08/04/2019  09:24:53 -------------------------------------------------------------------------------- Pain Assessment Details Patient Name: Date of Service: VEDDER, BRITTIAN 08/04/2019 9:15 AM [name] [id number] Patient Account Number: [id number] Date of Birth/Sex: Treating RN: Jan 15, 1946 (74 y.o. [name] Primary Care Shekita Boyden: [name], [name] Other Clinician: Referring Donyell Carrell: Treating Anice Wilshire/Extender:[name], [name] [name], [name] in Treatment: 172 Active Problems Location of Pain Severity and Description of Pain Patient Has Paino No Site Locations Pain Management and Medication Current Pain Management: Electronic Signature(s) Signed: 08/04/2019 6:03:05 PM By: Deon Pilling Signed: 08/05/2019 9:19:04 AM By: Sandre Kitty Entered By: Sandre Kitty on 08/04/2019 09:37:39 --------------------------------------------------------------------------------  Debrided: [14:N/A] [18:Subcutaneous, Slough] [23:N/A] Level: [14:N/A] [18:Skin/Subcutaneous Tissue] [23:N/A] Debridement Area (sq cm):N/A [18:2.97] [23:N/A] Instrument: [14:N/A] [18:Curette] [23:N/A] Bleeding: [14:N/A] [18:Minimum] [23:N/A] Hemostasis Achieved: [14:N/A] [18:Pressure] [23:N/A] Procedural Pain: [14:N/A] [18:0] [23:N/A] Post Procedural Pain: [14:N/A] [18:0] [23:N/A] Debridement Treatment N/A [18:Procedure was tolerated] [23:N/A] Response: [18:well] Post Debridement [14:N/A] [18:2.7x1.1x0.1] [23:N/A] Measurements L x W x D (cm) Post Debridement [14:N/A] [14:7.829] [23:N/A] Volume: (cm) Procedures Performed: N/A [18:Debridement] [23:N/A] Treatment Notes Wound #14 (Left, Lateral Lower Leg) 1. Cleanse With Wound Cleanser Soap and water 2. Periwound Care TCA Cream 3. Primary Dressing Applied Santyl Polymem Ag 4. Secondary Dressing ABD Pad Dry Gauze 6. Support Layer Applied Kerlix/Coban Notes santyl to right heel and right lateral lower leg, cotton instead of kerlix Wound #18 (Left, Dorsal Foot) 1. Cleanse With Wound Cleanser Soap and water 2. Periwound Care TCA Cream 3. Primary Dressing Applied Santyl Polymem Ag 4. Secondary Dressing ABD Pad Dry Gauze 6. Support Layer Applied Kerlix/Coban Notes santyl to right heel and right lateral lower leg, cotton instead of kerlix Wound #23 (Right, Lateral Calcaneus) 1. Cleanse With Wound Cleanser Soap and water 2. Periwound Care TCA Cream 3. Primary Dressing Applied Santyl Polymem Ag 4. Secondary Dressing ABD Pad Dry Gauze 6. Support Layer Applied Kerlix/Coban Notes santyl to right heel and right lateral lower leg, cotton instead of kerlix Wound #24 (Left Calcaneus) 1. Cleanse With Wound Cleanser Soap and water 2. Periwound Care TCA Cream 3. Primary Dressing Applied Santyl Polymem  Ag 4. Secondary Dressing ABD Pad Dry Gauze 6. Support Layer Applied Kerlix/Coban Notes santyl to right heel and right lateral lower leg, cotton instead of kerlix Wound #25 (Right, Lateral Lower Leg) 1. Cleanse With Wound Cleanser Soap and water 2. Periwound Care TCA Cream 3. Primary Dressing Applied Santyl Polymem Ag 4. Secondary Dressing ABD Pad Dry Gauze 6. Support Layer Applied Kerlix/Coban Notes santyl to right heel and right lateral lower leg, cotton instead of kerlix Electronic Signature(s) Signed: 08/04/2019 5:51:00 PM By: Linton Ham MD Signed: 08/04/2019 6:03:05 PM By: Deon Pilling Entered By: Linton Ham on 08/04/2019 11:18:24 -------------------------------------------------------------------------------- Multi-Disciplinary Care Plan Details Patient Name: Date of Service: [name] [name]. 08/04/2019 9:15 AM [name] [id number] Patient Account Number: [id number] Date of Birth/Sex: Treating RN: 1945/09/16 (74 y.o. [name] Primary Care Tine Mabee: [name], [name] Other Clinician: Referring Regnia Mathwig: Treating Pippa Hanif/Extender:[name], [name] [name], [name] in Treatment: 172 Active Inactive Wound/Skin Impairment Nursing Diagnoses: Impaired tissue integrity Goals: Patient/caregiver will verbalize understanding of skin care regimen Date Initiated: 11/19/2017 Target Resolution Date: 09/04/2019 Goal Status: Active Ulcer/skin breakdown will have a volume reduction of 30% by week 4 Date Initiated: 11/19/2017 Date Inactivated: 12/02/2017 Target Resolution Date: 12/22/2017 Unmet Reason: multipl Goal Status: Unmet comorbidities Interventions: Assess patient/caregiver ability to obtain necessary supplies Assess ulceration(s) every visit Provide education on ulcer and skin care Notes: Electronic Signature(s) Signed: 08/04/2019 6:03:05 PM By: Deon Pilling Entered By: Deon Pilling on 08/04/2019  09:24:53 -------------------------------------------------------------------------------- Pain Assessment Details Patient Name: Date of Service: VEDDER, BRITTIAN 08/04/2019 9:15 AM [name] [id number] Patient Account Number: [id number] Date of Birth/Sex: Treating RN: Jan 15, 1946 (74 y.o. [name] Primary Care Shekita Boyden: [name], [name] Other Clinician: Referring Donyell Carrell: Treating Anice Wilshire/Extender:[name], [name] [name], [name] in Treatment: 172 Active Problems Location of Pain Severity and Description of Pain Patient Has [facility] No Site Locations Pain Management and Medication Current Pain Management: Electronic Signature(s) Signed: 08/04/2019 6:03:05 PM By: Deon Pilling Signed: 08/05/2019 9:19:04 AM By: Sandre Kitty Entered By: Sandre Kitty on 08/04/2019 09:37:39 --------------------------------------------------------------------------------  0 X - Complex Wound Cleansing - multiple wounds 5 5 X - Wound Imaging (photographs - any number of wounds) 1 5 []  - Wound Tracing (instead of photographs) 0 []  - Simple Wound Measurement - one wound 0 X - Complex Wound Measurement - multiple wounds 5 5 INTERVENTIONS - Wound Dressings []  - Small Wound Dressing one or multiple wounds 0 []  - Medium Wound Dressing one or multiple wounds 0 X - Large Wound Dressing one or multiple wounds 2 20 []  - Application of Medications - topical 0 []  - Application of Medications - injection 0 INTERVENTIONS - Miscellaneous []  - External ear exam 0 []  - Specimen Collection (cultures, biopsies, blood, body fluids, etc.) 0 []  - Specimen(s) / Culture(s) sent or taken to Lab for analysis 0 []  - Patient Transfer (multiple staff / [employer] fast streamer / Similar devices) 0 []  - Simple Staple / Suture removal (25 or less) 0 []  - Complex Staple / Suture removal (26 or more) 0 []  - Hypo / Hyperglycemic Management (close monitor of Blood Glucose) 0 []  - Ankle / Brachial Index (ABI) - do not check if billed separately 0 X - Vital Signs 1 5 Has the patient been seen at the hospital within the last three years: Yes Total Score: 215 Level Of Care: New/Established - Level 5 Electronic Signature(s) Signed: 08/04/2019 6:03:05 PM By: Deon Pilling Entered By: Deon Pilling on 08/04/2019 10:50:08 -------------------------------------------------------------------------------- Encounter Discharge Information Details Patient Name: Date of Service: [name] [name]. 08/04/2019 9:15 AM [name] [id number] Patient Account Number: [id number] Date of Birth/Sex: Treating RN: 07-Mar-1946 (74 y.o. Marvis Repress Primary Care Khole Arterburn: [name], [name] Other Clinician: Referring Travanti Mcmanus: Treating  Devora Tortorella/Extender:[name], [name] [name], [name] in Treatment: 172 Encounter Discharge Information Items Post Procedure Vitals Discharge Condition: Stable Temperature (F): 98.4 Ambulatory Status: Wheelchair Pulse (bpm): 77 Discharge Destination: Home Respiratory Rate (breaths/min): 19 Transportation: Private Auto Blood Pressure (mmHg): 107/64 Accompanied By: daughter Schedule Follow-up Appointment: Yes Clinical Summary of Care: Patient Declined Electronic Signature(s) Signed: 08/04/2019 5:52:37 PM By: Kela Millin Entered By: Kela Millin on 08/04/2019 10:57:21 -------------------------------------------------------------------------------- Lower Extremity Assessment Details Patient Name: Date of Service: [name] [name] 08/04/2019 9:15 AM [name] [id number] Patient Account Number: [id number] Date of Birth/Sex: Treating RN: 01/02/1946 (74 y.o. Marvis Repress Primary Care Naythen Heikkila: [name], [name] Other Clinician: Referring Caitlen Worth: Treating Melvie Paglia/Extender:[name], [name] [name], [name] in Treatment: 172 Edema Assessment Assessed: [Left: No] [Right: No] Edema: [Left: Yes] [Right: Yes] Calf Left: Right: Point of Measurement: 36 cm From Medial Instep 20.5 cm 28 cm Ankle Left: Right: Point of Measurement: 11 cm From Medial Instep 18 cm 20.5 cm Vascular Assessment Pulses: Dorsalis Pedis Palpable: [Left:Yes] [Right:Yes] Electronic Signature(s) Signed: 08/04/2019 5:52:37 PM By: Kela Millin Entered By: Kela Millin on 08/04/2019 10:22:24 -------------------------------------------------------------------------------- Multi Wound Chart Details Patient Name: Date of Service: [name] [name]. 08/04/2019 9:15 AM [name] [id number] Patient Account Number: [id number] Date of Birth/Sex: Treating RN: Feb 23, 1946 (74 y.o. [name] Primary Care Markala Sitts: [name], [name] Other Clinician: Referring Aydenn Gervin:  Treating Aliene Tamura/Extender:[name], [name] [name], [name] in Treatment: 172 Vital Signs Height(in): 74 Pulse(bpm): 77 Weight(lbs): 150 Blood Pressure(mmHg): 107/64 Body Mass Index(BMI): 19 Temperature(F): 98.4 Respiratory 19 Rate(breaths/min): Photos: [14:No Photos] [18:No Photos] [23:No Photos] Wound Location: [14:Left, Lateral Lower Leg] [18:Left, Dorsal Foot] [23:Right, Lateral Calcaneus] Wounding Event: [14:Gradually Appeared] [18:Gradually Appeared] [23:Pressure Injury] 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

## 2019-08-05 NOTE — Progress Notes (Signed)
Wound #14 (Left, Lateral Lower Leg) 1. Cleanse  With Wound Cleanser 2. Periwound Care TCA Cream 3. Primary Dressing Applied Calcium Alginate Ag 4. Secondary Dressing Dry Gauze 6. Support Layer Applied Kerlix/Coban Wound #18 (Left, Dorsal Foot) 1. Cleanse With Wound Cleanser 2. Periwound Care TCA Cream 3. Primary Dressing Applied Calcium Alginate Ag 4. Secondary Dressing Dry Gauze 6. Support Layer Applied Kerlix/Coban Wound #23 (Right, Lateral Calcaneus) 1. Cleanse With Wound Cleanser 3. Primary Dressing Applied Santyl 4. Secondary Dressing Dry Gauze Roll Gauze 5. Secured With Tape Wound #24 (Left Calcaneus) 1. Cleanse With Wound Cleanser 2. Periwound Care TCA Cream 3. Primary Dressing Applied Calcium Alginate Ag 4. Secondary Dressing Dry Gauze 6. Support Layer Holiday representative) Signed: 06/30/2019 5:48:31 PM By: Linton Ham MD Signed: 07/01/2019 7:20:02 AM By: [name] RN Entered By: Linton Ham on 06/30/2019 11:06:00 -------------------------------------------------------------------------------- Multi-Disciplinary Care Plan Details Patient Name: Date of Service: [name] [name]. 06/30/2019 8:45 AM [name] [id number] Patient Account Number: [id number] Date of Birth/Sex: Treating RN: [birth year] (73 y.o. [name] Primary Care Huie Ghuman: [name], [name] Other Clinician: Referring Amour Cutrone: Treating Carmyn Hamm/Extender:[name], [name] [name], [name] in Treatment: 167 Active Inactive Wound/Skin Impairment Nursing Diagnoses: Impaired tissue integrity Goals: Patient/caregiver will verbalize understanding of skin care regimen Date Initiated: 11/19/2017 Target Resolution Date: 07/10/2019 Goal Status: Active Ulcer/skin breakdown will have a volume reduction of 30% by week 4 Date Initiated: 11/19/2017 Date Inactivated: 12/02/2017 Target Resolution Date: 12/22/2017 Unmet Reason: multipl Goal Status: Unmet comorbidities Interventions: Assess  patient/caregiver ability to obtain necessary supplies Assess ulceration(s) every visit Provide education on ulcer and skin care Notes: Electronic Signature(s) Signed: 07/01/2019 7:20:02 AM By: [name] RN Entered By: [name] on 06/30/2019 09:05:51 -------------------------------------------------------------------------------- Pain Assessment Details Patient Name: Date of Service: [name] [name], [name] [name] 06/30/2019 8:45 AM [name] [id number] Patient Account Number: [id number] Date of Birth/Sex: Treating RN: [birth year] (73 y.o. [name] Primary Care Demika Langenderfer: [name], [name] Other Clinician: Referring Eulogio Requena: Treating Nessa Ramaker/Extender:[name], [name] [name], [name] in Treatment: 167 Active Problems Location of Pain Severity and Description of Pain Patient Has Paino No Site Locations Pain Management and Medication Current Pain Management: Electronic Signature(s) Signed: 07/01/2019 7:20:02 AM By: [name] RN Signed: 08/05/2019 9:21:08 AM By: Sandre Kitty Entered By: Sandre Kitty on 06/30/2019 09:15:37 -------------------------------------------------------------------------------- Patient/Caregiver Education Details Patient Name: Date of Service: [name] [name] 3/9/2021andnbsp8:45 AM [name] [phone] Patient Account Number: [id number] Date of Birth/Gender: Treating RN: 09-20-1945 (73 y.o. [name] Primary Care Physician: [name], [name] Other Clinician: Referring Physician: Treating Physician/Extender:[name], [name] [name], [name] in Treatment: 167 Education Assessment Education Provided To: Patient Education Topics Provided Wound/Skin Impairment: Methods: Explain/Verbal Responses: State content correctly Electronic Signature(s) Signed: 07/01/2019 7:20:02 AM By: [name] RN Entered By: [name] on 06/30/2019  09:06:10 -------------------------------------------------------------------------------- Wound Assessment Details Patient Name: Date of Service: [name] [name] 06/30/2019 8:45 AM [name] [id number] Patient Account Number: [id number] Date of Birth/Sex: Treating RN: 1945-09-22 (73 y.o. [name] Primary Care Corrigan Kretschmer: [name], [name] Other Clinician: Referring Capri Raben: Treating Tanise Russman/Extender:[name], [name] [name], [name] in Treatment: 167 Wound Status Wound Number: 14 Primary Diabetic Wound/Ulcer of the Lower Extremity Etiology: Wound Location: Left Lower Leg - Lateral Wound Open Wounding Event: Gradually Appeared Status: Date Acquired: 08/15/2017 Comorbid Anemia, Sleep Apnea, Arrhythmia, Congestive Weeks Of Treatment: 97 History: Heart Failure, Coronary Artery Disease, Clustered Wound: Yes Hypertension, Myocardial Infarction, Type II Diabetes, Gout, Neuropathy Photos Wound Measurements Length: (cm) 2.5 Width: (cm) 4.3 Depth: (cm) 0.1 Clustered Quantity:  [name] [name], [name] [name] ([id number]) Visit Report for 06/30/2019 Arrival Information Details Patient Name: Date of Service: [name] [name], [name] [name] 06/30/2019 8:45 AM [name] [id number] Patient Account Number: [id number] Date of Birth/Sex: Treating RN: [birth year] (73 y.o. [name]) [name] Primary Care Jami Bogdanski: [name], [name] Other Clinician: Referring Mariselda Badalamenti: Treating Priscilla Finklea/Extender:[name], [name] [name], [name] in Treatment: 167 Visit Information History Since Last Visit Added or deleted any medications: No Patient Arrived: Wheel Chair Any new allergies or adverse reactions: No Arrival Time: 09:09 Had a fall or experienced change in Yes Accompanied By: daughter activities of daily living that may affect Transfer Assistance: Manual risk of falls: Patient Identification Verified: Yes Signs or symptoms of abuse/neglect since last No Secondary Verification Process Yes visito Completed: Hospitalized since last visit: No Patient Requires Transmission- No Implantable device outside of the clinic excluding No Based Precautions: cellular tissue based products placed in the center Patient Has Alerts: Yes Patient on Blood since last visit: Patient Alerts: Has Dressing in Place as Prescribed: Yes Thinner left ABI 1.0; rt ABI Pain Present Now: No 1.09 Notes Daughter stated her father Saenz) had a fall when he dropped his tissue he was trying to pick it up and the wheel chair flipped over he fractured his nose an busted his head open he had to get stiches. Electronic Signature(s) Signed: 08/05/2019 9:21:08 AM By: Sandre Kitty Entered By: Sandre Kitty on 06/30/2019 09:14:34 -------------------------------------------------------------------------------- Encounter Discharge Information Details Patient Name: Date of Service: [name] [name] 06/30/2019 8:45 AM [name] [id number] Patient Account Number: [id number] Date of Birth/Sex: Treating  RN: [birth year] (73 y.o. Marvis Repress Primary Care Hermelinda Diegel: [name], [name] Other Clinician: Referring Nasri Boakye: Treating Taytum Wheller/Extender:[name], [name] [name], [name] in Treatment: [phone] Encounter Discharge Information Items Post Procedure Vitals Discharge Condition: Stable Temperature (F): 98.1 Ambulatory Status: Wheelchair Pulse (bpm): 70 Discharge Destination: Home Respiratory Rate (breaths/min): 19 Transportation: Private Auto Blood Pressure (mmHg): 115/60 Accompanied By: daughter Schedule Follow-up Appointment: Yes Clinical Summary of Care: Patient Declined Electronic Signature(s) Signed: 06/30/2019 5:46:17 PM By: Kela Millin Entered By: Kela Millin on 06/30/2019 10:52:40 -------------------------------------------------------------------------------- Lower Extremity Assessment Details Patient Name: Date of Service: [name] [name], [name] [name] 06/30/2019 8:45 AM [name] [id number] Patient Account Number: [id number] Date of Birth/Sex: Treating RN: 1945/07/07 (74 y.o. Marvis Repress Primary Care Freida Nebel: [name], [name] Other Clinician: Referring Kathalina Ostermann: Treating Narya Beavin/Extender:[name], [name] [name], [name] in Treatment: 167 Edema Assessment Assessed: [Left: No] [Right: No] Edema: [Left: No] [Right: No] Calf Left: Right: Point of Measurement: 36 cm From Medial Instep 23 cm 26 cm Ankle Left: Right: Point of Measurement: 11 cm From Medial Instep 17.5 cm 18 cm Vascular Assessment Pulses: Dorsalis Pedis Palpable: [Left:Yes] [Right:Yes] Electronic Signature(s) Signed: 06/30/2019 5:46:17 PM By: Kela Millin Entered By: Kela Millin on 06/30/2019 09:47:25 -------------------------------------------------------------------------------- Multi Wound Chart Details Patient Name: Date of Service: [name] [name] 06/30/2019 8:45 AM [name] [id number] Patient Account Number: [id number] Date of Birth/Sex: Treating  RN: [birth year] (73 y.o. [name] Primary Care Detrell Umscheid: [name], [name] Other Clinician: Referring Jazell Rosenau: Treating Rabon Scholle/Extender:[name], [name] [name], [name] in Treatment: 167 Vital Signs Height(in): 74 Capillary Blood 145 Glucose(mg/dl): Weight(lbs): 150 Pulse(bpm): 70 Body Mass Index(BMI): 19 Blood Pressure(mmHg): 115/60 Temperature(F): 98.1 Respiratory 19 Rate(breaths/min): Photos: [14:No Photos] [18:No Photos] [23:No Photos] Wound Location: [14:Left Lower Leg - Lateral] [18:Left Foot - Dorsal] [23:Right Calcaneus - Lateral] Wounding Event: [14:Gradually Appeared] [18:Gradually Appeared] [23:Pressure Injury] Primary Etiology: [14:Diabetic Wound/Ulcer of the Diabetic Wound/Ulcer of the Diabetic Wound/Ulcer of the Lower Extremity] [18:Lower Extremity] [23:Lower Extremity]  [name] [name], [name] [name] ([id number]) Visit Report for 06/30/2019 Arrival Information Details Patient Name: Date of Service: [name] [name], [name] [name] 06/30/2019 8:45 AM [name] [id number] Patient Account Number: [id number] Date of Birth/Sex: Treating RN: [birth year] (73 y.o. [name]) [name] Primary Care Jami Bogdanski: [name], [name] Other Clinician: Referring Mariselda Badalamenti: Treating Priscilla Finklea/Extender:[name], [name] [name], [name] in Treatment: 167 Visit Information History Since Last Visit Added or deleted any medications: No Patient Arrived: Wheel Chair Any new allergies or adverse reactions: No Arrival Time: 09:09 Had a fall or experienced change in Yes Accompanied By: daughter activities of daily living that may affect Transfer Assistance: Manual risk of falls: Patient Identification Verified: Yes Signs or symptoms of abuse/neglect since last No Secondary Verification Process Yes visito Completed: Hospitalized since last visit: No Patient Requires Transmission- No Implantable device outside of the clinic excluding No Based Precautions: cellular tissue based products placed in the center Patient Has Alerts: Yes Patient on Blood since last visit: Patient Alerts: Has Dressing in Place as Prescribed: Yes Thinner left ABI 1.0; rt ABI Pain Present Now: No 1.09 Notes Daughter stated her father Saenz) had a fall when he dropped his tissue he was trying to pick it up and the wheel chair flipped over he fractured his nose an busted his head open he had to get stiches. Electronic Signature(s) Signed: 08/05/2019 9:21:08 AM By: Sandre Kitty Entered By: Sandre Kitty on 06/30/2019 09:14:34 -------------------------------------------------------------------------------- Encounter Discharge Information Details Patient Name: Date of Service: [name] [name] 06/30/2019 8:45 AM [name] [id number] Patient Account Number: [id number] Date of Birth/Sex: Treating  RN: [birth year] (73 y.o. Marvis Repress Primary Care Hermelinda Diegel: [name], [name] Other Clinician: Referring Nasri Boakye: Treating Taytum Wheller/Extender:[name], [name] [name], [name] in Treatment: [phone] Encounter Discharge Information Items Post Procedure Vitals Discharge Condition: Stable Temperature (F): 98.1 Ambulatory Status: Wheelchair Pulse (bpm): 70 Discharge Destination: Home Respiratory Rate (breaths/min): 19 Transportation: Private Auto Blood Pressure (mmHg): 115/60 Accompanied By: daughter Schedule Follow-up Appointment: Yes Clinical Summary of Care: Patient Declined Electronic Signature(s) Signed: 06/30/2019 5:46:17 PM By: Kela Millin Entered By: Kela Millin on 06/30/2019 10:52:40 -------------------------------------------------------------------------------- Lower Extremity Assessment Details Patient Name: Date of Service: [name] [name], [name] [name] 06/30/2019 8:45 AM [name] [id number] Patient Account Number: [id number] Date of Birth/Sex: Treating RN: 1945/07/07 (74 y.o. Marvis Repress Primary Care Freida Nebel: [name], [name] Other Clinician: Referring Kathalina Ostermann: Treating Narya Beavin/Extender:[name], [name] [name], [name] in Treatment: 167 Edema Assessment Assessed: [Left: No] [Right: No] Edema: [Left: No] [Right: No] Calf Left: Right: Point of Measurement: 36 cm From Medial Instep 23 cm 26 cm Ankle Left: Right: Point of Measurement: 11 cm From Medial Instep 17.5 cm 18 cm Vascular Assessment Pulses: Dorsalis Pedis Palpable: [Left:Yes] [Right:Yes] Electronic Signature(s) Signed: 06/30/2019 5:46:17 PM By: Kela Millin Entered By: Kela Millin on 06/30/2019 09:47:25 -------------------------------------------------------------------------------- Multi Wound Chart Details Patient Name: Date of Service: [name] [name] 06/30/2019 8:45 AM [name] [id number] Patient Account Number: [id number] Date of Birth/Sex: Treating  RN: [birth year] (73 y.o. [name] Primary Care Detrell Umscheid: [name], [name] Other Clinician: Referring Jazell Rosenau: Treating Rabon Scholle/Extender:[name], [name] [name], [name] in Treatment: 167 Vital Signs Height(in): 74 Capillary Blood 145 Glucose(mg/dl): Weight(lbs): 150 Pulse(bpm): 70 Body Mass Index(BMI): 19 Blood Pressure(mmHg): 115/60 Temperature(F): 98.1 Respiratory 19 Rate(breaths/min): Photos: [14:No Photos] [18:No Photos] [23:No Photos] Wound Location: [14:Left Lower Leg - Lateral] [18:Left Foot - Dorsal] [23:Right Calcaneus - Lateral] Wounding Event: [14:Gradually Appeared] [18:Gradually Appeared] [23:Pressure Injury] Primary Etiology: [14:Diabetic Wound/Ulcer of the Diabetic Wound/Ulcer of the Diabetic Wound/Ulcer of the Lower Extremity] [18:Lower Extremity] [23:Lower Extremity]  Wound #14 (Left, Lateral Lower Leg) 1. Cleanse  With Wound Cleanser 2. Periwound Care TCA Cream 3. Primary Dressing Applied Calcium Alginate Ag 4. Secondary Dressing Dry Gauze 6. Support Layer Applied Kerlix/Coban Wound #18 (Left, Dorsal Foot) 1. Cleanse With Wound Cleanser 2. Periwound Care TCA Cream 3. Primary Dressing Applied Calcium Alginate Ag 4. Secondary Dressing Dry Gauze 6. Support Layer Applied Kerlix/Coban Wound #23 (Right, Lateral Calcaneus) 1. Cleanse With Wound Cleanser 3. Primary Dressing Applied Santyl 4. Secondary Dressing Dry Gauze Roll Gauze 5. Secured With Tape Wound #24 (Left Calcaneus) 1. Cleanse With Wound Cleanser 2. Periwound Care TCA Cream 3. Primary Dressing Applied Calcium Alginate Ag 4. Secondary Dressing Dry Gauze 6. Support Layer Holiday representative) Signed: 06/30/2019 5:48:31 PM By: Linton Ham MD Signed: 07/01/2019 7:20:02 AM By: [name] RN Entered By: Linton Ham on 06/30/2019 11:06:00 -------------------------------------------------------------------------------- Multi-Disciplinary Care Plan Details Patient Name: Date of Service: [name] [name]. 06/30/2019 8:45 AM [name] [id number] Patient Account Number: [id number] Date of Birth/Sex: Treating RN: [birth year] (73 y.o. [name] Primary Care Huie Ghuman: [name], [name] Other Clinician: Referring Amour Cutrone: Treating Carmyn Hamm/Extender:[name], [name] [name], [name] in Treatment: 167 Active Inactive Wound/Skin Impairment Nursing Diagnoses: Impaired tissue integrity Goals: Patient/caregiver will verbalize understanding of skin care regimen Date Initiated: 11/19/2017 Target Resolution Date: 07/10/2019 Goal Status: Active Ulcer/skin breakdown will have a volume reduction of 30% by week 4 Date Initiated: 11/19/2017 Date Inactivated: 12/02/2017 Target Resolution Date: 12/22/2017 Unmet Reason: multipl Goal Status: Unmet comorbidities Interventions: Assess  patient/caregiver ability to obtain necessary supplies Assess ulceration(s) every visit Provide education on ulcer and skin care Notes: Electronic Signature(s) Signed: 07/01/2019 7:20:02 AM By: [name] RN Entered By: [name] on 06/30/2019 09:05:51 -------------------------------------------------------------------------------- Pain Assessment Details Patient Name: Date of Service: [name] [name], [name] [name] 06/30/2019 8:45 AM [name] [id number] Patient Account Number: [id number] Date of Birth/Sex: Treating RN: [birth year] (73 y.o. [name] Primary Care Demika Langenderfer: [name], [name] Other Clinician: Referring Eulogio Requena: Treating Nessa Ramaker/Extender:[name], [name] [name], [name] in Treatment: 167 Active Problems Location of Pain Severity and Description of Pain Patient Has Paino No Site Locations Pain Management and Medication Current Pain Management: Electronic Signature(s) Signed: 07/01/2019 7:20:02 AM By: [name] RN Signed: 08/05/2019 9:21:08 AM By: Sandre Kitty Entered By: Sandre Kitty on 06/30/2019 09:15:37 -------------------------------------------------------------------------------- Patient/Caregiver Education Details Patient Name: Date of Service: [name] [name] 3/9/2021andnbsp8:45 AM [name] [phone] Patient Account Number: [id number] Date of Birth/Gender: Treating RN: 09-20-1945 (73 y.o. [name] Primary Care Physician: [name], [name] Other Clinician: Referring Physician: Treating Physician/Extender:[name], [name] [name], [name] in Treatment: 167 Education Assessment Education Provided To: Patient Education Topics Provided Wound/Skin Impairment: Methods: Explain/Verbal Responses: State content correctly Electronic Signature(s) Signed: 07/01/2019 7:20:02 AM By: [name] RN Entered By: [name] on 06/30/2019  09:06:10 -------------------------------------------------------------------------------- Wound Assessment Details Patient Name: Date of Service: [name] [name] 06/30/2019 8:45 AM [name] [id number] Patient Account Number: [id number] Date of Birth/Sex: Treating RN: 1945-09-22 (73 y.o. [name] Primary Care Corrigan Kretschmer: [name], [name] Other Clinician: Referring Capri Raben: Treating Tanise Russman/Extender:[name], [name] [name], [name] in Treatment: 167 Wound Status Wound Number: 14 Primary Diabetic Wound/Ulcer of the Lower Extremity Etiology: Wound Location: Left Lower Leg - Lateral Wound Open Wounding Event: Gradually Appeared Status: Date Acquired: 08/15/2017 Comorbid Anemia, Sleep Apnea, Arrhythmia, Congestive Weeks Of Treatment: 97 History: Heart Failure, Coronary Artery Disease, Clustered Wound: Yes Hypertension, Myocardial Infarction, Type II Diabetes, Gout, Neuropathy Photos Wound Measurements Length: (cm) 2.5 Width: (cm) 4.3 Depth: (cm) 0.1 Clustered Quantity:  [name] [name], [name] [name] ([id number]) Visit Report for 06/30/2019 Arrival Information Details Patient Name: Date of Service: [name] [name], [name] [name] 06/30/2019 8:45 AM [name] [id number] Patient Account Number: [id number] Date of Birth/Sex: Treating RN: [birth year] (73 y.o. [name]) [name] Primary Care Jami Bogdanski: [name], [name] Other Clinician: Referring Mariselda Badalamenti: Treating Priscilla Finklea/Extender:[name], [name] [name], [name] in Treatment: 167 Visit Information History Since Last Visit Added or deleted any medications: No Patient Arrived: Wheel Chair Any new allergies or adverse reactions: No Arrival Time: 09:09 Had a fall or experienced change in Yes Accompanied By: daughter activities of daily living that may affect Transfer Assistance: Manual risk of falls: Patient Identification Verified: Yes Signs or symptoms of abuse/neglect since last No Secondary Verification Process Yes visito Completed: Hospitalized since last visit: No Patient Requires Transmission- No Implantable device outside of the clinic excluding No Based Precautions: cellular tissue based products placed in the center Patient Has Alerts: Yes Patient on Blood since last visit: Patient Alerts: Has Dressing in Place as Prescribed: Yes Thinner left ABI 1.0; rt ABI Pain Present Now: No 1.09 Notes Daughter stated her father Saenz) had a fall when he dropped his tissue he was trying to pick it up and the wheel chair flipped over he fractured his nose an busted his head open he had to get stiches. Electronic Signature(s) Signed: 08/05/2019 9:21:08 AM By: Sandre Kitty Entered By: Sandre Kitty on 06/30/2019 09:14:34 -------------------------------------------------------------------------------- Encounter Discharge Information Details Patient Name: Date of Service: [name] [name] 06/30/2019 8:45 AM [name] [id number] Patient Account Number: [id number] Date of Birth/Sex: Treating  RN: [birth year] (73 y.o. Marvis Repress Primary Care Hermelinda Diegel: [name], [name] Other Clinician: Referring Nasri Boakye: Treating Taytum Wheller/Extender:[name], [name] [name], [name] in Treatment: [phone] Encounter Discharge Information Items Post Procedure Vitals Discharge Condition: Stable Temperature (F): 98.1 Ambulatory Status: Wheelchair Pulse (bpm): 70 Discharge Destination: Home Respiratory Rate (breaths/min): 19 Transportation: Private Auto Blood Pressure (mmHg): 115/60 Accompanied By: daughter Schedule Follow-up Appointment: Yes Clinical Summary of Care: Patient Declined Electronic Signature(s) Signed: 06/30/2019 5:46:17 PM By: Kela Millin Entered By: Kela Millin on 06/30/2019 10:52:40 -------------------------------------------------------------------------------- Lower Extremity Assessment Details Patient Name: Date of Service: [name] [name], [name] [name] 06/30/2019 8:45 AM [name] [id number] Patient Account Number: [id number] Date of Birth/Sex: Treating RN: 1945/07/07 (74 y.o. Marvis Repress Primary Care Freida Nebel: [name], [name] Other Clinician: Referring Kathalina Ostermann: Treating Narya Beavin/Extender:[name], [name] [name], [name] in Treatment: 167 Edema Assessment Assessed: [Left: No] [Right: No] Edema: [Left: No] [Right: No] Calf Left: Right: Point of Measurement: 36 cm From Medial Instep 23 cm 26 cm Ankle Left: Right: Point of Measurement: 11 cm From Medial Instep 17.5 cm 18 cm Vascular Assessment Pulses: Dorsalis Pedis Palpable: [Left:Yes] [Right:Yes] Electronic Signature(s) Signed: 06/30/2019 5:46:17 PM By: Kela Millin Entered By: Kela Millin on 06/30/2019 09:47:25 -------------------------------------------------------------------------------- Multi Wound Chart Details Patient Name: Date of Service: [name] [name] 06/30/2019 8:45 AM [name] [id number] Patient Account Number: [id number] Date of Birth/Sex: Treating  RN: [birth year] (73 y.o. [name] Primary Care Detrell Umscheid: [name], [name] Other Clinician: Referring Jazell Rosenau: Treating Rabon Scholle/Extender:[name], [name] [name], [name] in Treatment: 167 Vital Signs Height(in): 74 Capillary Blood 145 Glucose(mg/dl): Weight(lbs): 150 Pulse(bpm): 70 Body Mass Index(BMI): 19 Blood Pressure(mmHg): 115/60 Temperature(F): 98.1 Respiratory 19 Rate(breaths/min): Photos: [14:No Photos] [18:No Photos] [23:No Photos] Wound Location: [14:Left Lower Leg - Lateral] [18:Left Foot - Dorsal] [23:Right Calcaneus - Lateral] Wounding Event: [14:Gradually Appeared] [18:Gradually Appeared] [23:Pressure Injury] Primary Etiology: [14:Diabetic Wound/Ulcer of the Diabetic Wound/Ulcer of the Diabetic Wound/Ulcer of the Lower Extremity] [18:Lower Extremity] [23:Lower Extremity]

## 2019-08-14 ENCOUNTER — Other Ambulatory Visit: Payer: Self-pay | Admitting: *Deleted

## 2019-08-14 DIAGNOSIS — I130 Hypertensive heart and chronic kidney disease with heart failure and stage 1 through stage 4 chronic kidney disease, or unspecified chronic kidney disease: Secondary | ICD-10-CM

## 2019-08-14 DIAGNOSIS — IMO0002 Reserved for concepts with insufficient information to code with codable children: Secondary | ICD-10-CM

## 2019-08-14 DIAGNOSIS — E1165 Type 2 diabetes mellitus with hyperglycemia: Secondary | ICD-10-CM

## 2019-08-14 NOTE — Telephone Encounter (Signed)
Referral placed as requested.

## 2019-08-18 ENCOUNTER — Other Ambulatory Visit: Payer: Self-pay

## 2019-08-18 ENCOUNTER — Encounter (HOSPITAL_BASED_OUTPATIENT_CLINIC_OR_DEPARTMENT_OTHER): Payer: Medicare HMO | Admitting: Internal Medicine

## 2019-08-18 DIAGNOSIS — L97522 Non-pressure chronic ulcer of other part of left foot with fat layer exposed: Secondary | ICD-10-CM | POA: Diagnosis not present

## 2019-08-18 DIAGNOSIS — G4730 Sleep apnea, unspecified: Secondary | ICD-10-CM | POA: Diagnosis not present

## 2019-08-18 DIAGNOSIS — L97321 Non-pressure chronic ulcer of left ankle limited to breakdown of skin: Secondary | ICD-10-CM | POA: Diagnosis not present

## 2019-08-18 DIAGNOSIS — L97212 Non-pressure chronic ulcer of right calf with fat layer exposed: Secondary | ICD-10-CM | POA: Diagnosis not present

## 2019-08-18 DIAGNOSIS — L97412 Non-pressure chronic ulcer of right heel and midfoot with fat layer exposed: Secondary | ICD-10-CM | POA: Diagnosis not present

## 2019-08-18 DIAGNOSIS — L89620 Pressure ulcer of left heel, unstageable: Secondary | ICD-10-CM | POA: Diagnosis not present

## 2019-08-18 DIAGNOSIS — L97521 Non-pressure chronic ulcer of other part of left foot limited to breakdown of skin: Secondary | ICD-10-CM | POA: Diagnosis not present

## 2019-08-18 DIAGNOSIS — L97221 Non-pressure chronic ulcer of left calf limited to breakdown of skin: Secondary | ICD-10-CM | POA: Diagnosis not present

## 2019-08-18 DIAGNOSIS — E11621 Type 2 diabetes mellitus with foot ulcer: Secondary | ICD-10-CM | POA: Diagnosis not present

## 2019-08-18 DIAGNOSIS — I110 Hypertensive heart disease with heart failure: Secondary | ICD-10-CM | POA: Diagnosis not present

## 2019-08-18 DIAGNOSIS — L97211 Non-pressure chronic ulcer of right calf limited to breakdown of skin: Secondary | ICD-10-CM | POA: Diagnosis not present

## 2019-08-18 DIAGNOSIS — L89613 Pressure ulcer of right heel, stage 3: Secondary | ICD-10-CM | POA: Diagnosis not present

## 2019-08-18 NOTE — Progress Notes (Signed)
right patella. This was apparently traumatic Electronic Signature(s) Signed: 08/18/2019 5:41:23 PM By: Linton Ham MD Entered By: Linton Ham on 08/18/2019 13:06:08 -------------------------------------------------------------------------------- Physician Orders Details Patient Name: Date of Service: [name] [name] 08/18/2019 9:15 AM [name] [id number] Patient Account Number: [id number] Date of Birth/Sex: Treating RN: [birth year] (74 y.o. 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 Dressing Change  Frequency Wound #14 Left,Lateral Lower Leg Change dressing three times week. Wound #18 Left,Proximal,Dorsal Foot Change dressing three times week. Wound #23 Right,Lateral Calcaneus Change Dressing every other day. Wound #24 Left Calcaneus Change dressing three times week. Wound #25 Right,Lateral Lower Leg Change Dressing every other day. Wound #26 Left,Distal Foot Change dressing three times week. Wound #27 Right,Lateral Knee Change dressing three times week. Skin Barriers/Peri-Wound Care Other: - TCA to left lower extrimity Wound Cleansing Clean wound with Normal Saline. May shower with protection. Primary Wound Dressing Wound #14 Left,Lateral Lower Leg Polymem Silver Wound #18 Left,Proximal,Dorsal Foot Polymem Silver Wound #23 Right,Lateral Calcaneus Santyl Ointment Wound #24 Left Calcaneus Polymem Silver Wound #25 Right,Lateral Lower Leg Santyl Ointment Wound #26 Left,Distal Foot Polymem Silver Wound #27 Right,Lateral Knee Polymem Silver Secondary Dressing Dry Gauze Other: - apply a triple antibiotic ointment and bandaid to left elbow skin tear. Edema Control Kerlix and Coban - Bilateral - use cotton roll not kerlix Avoid standing for long periods of time Elevate legs to the level of the heart or above for 30 minutes daily and/or when sitting, a frequency of: - throughout the day Off-Loading Multipodus Splint to: - patient to wear bunny boots to both feet while resting in chair and bed. Electronic Signature(s) Signed: 08/18/2019 5:28:23 PM By: [name] RN Signed: 08/18/2019 5:41:23 PM By: Linton Ham MD Entered By: [name] on 08/18/2019 10:34:19 -------------------------------------------------------------------------------- Problem List Details Patient Name: Date of Service: [name] [name] 08/18/2019 9:15 AM [name] [id number] Patient Account Number: [id number] Date of Birth/Sex: Treating RN: 03/22/1946 (74 y.o. [name] Primary Care Provider: [name], [name] Other Clinician: Referring Provider: Treating Provider/Extender:Purva Vessell [name], [name] in Treatment: 174 Active Problems ICD-10 Encounter Code Description Active Date MDM Diagnosis L89.613 Pressure ulcer of right heel, stage 3 04/17/2016 No Yes L97.221 Non-pressure chronic ulcer of left calf limited to 08/15/2017 No Yes breakdown of skin L97.321 Non-pressure chronic ulcer of left ankle limited to 07/01/2018 No Yes breakdown of skin L97.521 Non-pressure chronic ulcer of other part of left foot 02/17/2019 No Yes limited to breakdown of skin L89.620 Pressure ulcer of left heel, unstageable 07/21/2019 No Yes L97.211 Non-pressure chronic ulcer of right calf limited to 07/21/2019 No Yes breakdown of skin S80.211D Abrasion, right knee, subsequent encounter 08/18/2019 No Yes Inactive Problems ICD-10 Code Description Active Date Inactive Date E11.621 Type 2 diabetes mellitus with foot ulcer 04/17/2016 04/17/2016 L03.032 Cellulitis of left toe 01/06/2019 01/06/2019 L89.623 Pressure ulcer of left heel, stage 3 12/04/2016 12/04/2016 I25.119 Atherosclerotic heart disease of native coronary artery  using Sorbact to all wound areas. He had a significant excoriated rash on his left leg when he was here last time. We changed from Kerlix to cotton under the Coban and this seems to have helped 2/2; 1 month follow-up. The patient has open areas on the left lateral tibial area mid lower leg, left dorsal foot/ankle and an area on the right lateral calcaneus. The right lateral calcaneus is better as is the left dorsal foot/ankle. The area on the left lateral tibia however is larger. 3/9; 1 month follow-up. The left lateral tibia looks mostly the same. Left dorsal foot and ankle improved and the area on the right lateral calcaneus is quite a bit worse. He has had previous arterial studies last year that were fairly normal. I continue to think that these are pressure areas. We have been using silver alginate on all the wounds 3/30; 3-week follow-up. The left lateral tibia is still open but with less width. Left dorsal foot small punched out wound. He has a large area  on his right lateral calcaneus and an area that I did not pick up on last time on the tip of the left calcaneus As well he has a new area on the right lateral calf which was new today. 4/13; this is a patient with multiple medical problems. He has been in this clinic for a very prolonged period of time with different wounds in different areas in his lower extremities. Currently we have the left lateral tibia, left heel, left dorsal foot, right lateral heel and right lateral lower tibia. We have been using Santyl on the right and silver alginate on the left He has his daughter who changes his dressings. The reason behind the nonhealing here and ulcer recurrence is really never been clear to me. The patient is debilitated. He uses bunny boots apparently at night to protect his heels and feet. His arterial studies have previously not suggested a primary arterial etiology 4/27 2-week follow-up. He has a new wound on the right anterior patella currently trauma against the wheelchair. Failing this had he has areas on the right lateral calf and right lateral calcaneus both of which we have been using Santyl. On the left side one of the original wounds on the left lateral tibia area, left calcaneus, punched out areas on the left dorsal foot. On the left we have been using PolyMem Ag Electronic Signature(s) Signed: 08/18/2019 5:41:23 PM By: Linton Ham MD Entered By: Linton Ham on 08/18/2019 13:04:35 -------------------------------------------------------------------------------- Physical Exam Details Patient Name: Date of Service: [name] [name] 08/18/2019 9:15 AM [name] [id number] Patient Account Number: [id number] Date of Birth/Sex: Treating RN: 11/18/1945 (74 y.o. [name] Primary Care Provider: [name], [name] Other Clinician: Referring Provider: Treating Provider/Extender:Latreece Mochizuki [name], [name] in Treatment: 174 Respiratory work of breathing is  normal. Cardiovascular Her Salas pedis pulses are palpable. Integumentary (Hair, Skin) No primary cutaneous issues. Notes Wound exam On the left foot of the left lateral wound parallel to the tibia no better. There are some rims of epithelialization. The wound is long superficial I have not been able to get this to reliably epithelialized The deep wrap abrasion on the left dorsal foot has several open areas here. They require debridement with a #3 curette The area on the left heel looks satisfactory no debridement was required On the right lateral heel and right lateral lower leg both of these require debridement although the wounds look better using Santyl Small circular open area on the  using Sorbact to all wound areas. He had a significant excoriated rash on his left leg when he was here last time. We changed from Kerlix to cotton under the Coban and this seems to have helped 2/2; 1 month follow-up. The patient has open areas on the left lateral tibial area mid lower leg, left dorsal foot/ankle and an area on the right lateral calcaneus. The right lateral calcaneus is better as is the left dorsal foot/ankle. The area on the left lateral tibia however is larger. 3/9; 1 month follow-up. The left lateral tibia looks mostly the same. Left dorsal foot and ankle improved and the area on the right lateral calcaneus is quite a bit worse. He has had previous arterial studies last year that were fairly normal. I continue to think that these are pressure areas. We have been using silver alginate on all the wounds 3/30; 3-week follow-up. The left lateral tibia is still open but with less width. Left dorsal foot small punched out wound. He has a large area  on his right lateral calcaneus and an area that I did not pick up on last time on the tip of the left calcaneus As well he has a new area on the right lateral calf which was new today. 4/13; this is a patient with multiple medical problems. He has been in this clinic for a very prolonged period of time with different wounds in different areas in his lower extremities. Currently we have the left lateral tibia, left heel, left dorsal foot, right lateral heel and right lateral lower tibia. We have been using Santyl on the right and silver alginate on the left He has his daughter who changes his dressings. The reason behind the nonhealing here and ulcer recurrence is really never been clear to me. The patient is debilitated. He uses bunny boots apparently at night to protect his heels and feet. His arterial studies have previously not suggested a primary arterial etiology 4/27 2-week follow-up. He has a new wound on the right anterior patella currently trauma against the wheelchair. Failing this had he has areas on the right lateral calf and right lateral calcaneus both of which we have been using Santyl. On the left side one of the original wounds on the left lateral tibia area, left calcaneus, punched out areas on the left dorsal foot. On the left we have been using PolyMem Ag Electronic Signature(s) Signed: 08/18/2019 5:41:23 PM By: Linton Ham MD Entered By: Linton Ham on 08/18/2019 13:04:35 -------------------------------------------------------------------------------- Physical Exam Details Patient Name: Date of Service: [name] [name] 08/18/2019 9:15 AM [name] [id number] Patient Account Number: [id number] Date of Birth/Sex: Treating RN: 11/18/1945 (74 y.o. [name] Primary Care Provider: [name], [name] Other Clinician: Referring Provider: Treating Provider/Extender:Latreece Mochizuki [name], [name] in Treatment: 174 Respiratory work of breathing is  normal. Cardiovascular Her Salas pedis pulses are palpable. Integumentary (Hair, Skin) No primary cutaneous issues. Notes Wound exam On the left foot of the left lateral wound parallel to the tibia no better. There are some rims of epithelialization. The wound is long superficial I have not been able to get this to reliably epithelialized The deep wrap abrasion on the left dorsal foot has several open areas here. They require debridement with a #3 curette The area on the left heel looks satisfactory no debridement was required On the right lateral heel and right lateral lower leg both of these require debridement although the wounds look better using Santyl Small circular open area on the  week. Wound #27 Right,Lateral Knee: Change dressing three times week. Skin Barriers/Peri-Wound Care: Other: - TCA to left lower extrimity Wound Cleansing: Clean wound with Normal Saline. May shower with protection. Primary Wound Dressing: Wound #14 Left,Lateral Lower Leg: Polymem Silver Wound #18 Left,Proximal,Dorsal Foot: Polymem Silver Wound #23 Right,Lateral Calcaneus: Santyl Ointment Wound #24 Left Calcaneus: Polymem Silver Wound #25 Right,Lateral Lower Leg: Santyl Ointment Wound #26 Left,Distal Foot: Polymem Silver Wound #27 Right,Lateral Knee: Polymem Silver Secondary Dressing: Dry Gauze Other: - apply a triple antibiotic ointment and bandaid to left elbow skin tear. Edema Control: Kerlix and Coban - Bilateral - use cotton roll not kerlix Avoid standing for long periods of time Elevate legs to the level of the heart or above for 30 minutes daily and/or when sitting, a frequency of: - throughout the day Off-Loading: Multipodus Splint to: - patient to wear bunny boots to both feet while resting in chair and bed. 1. I continue with PolyMem to the areas on the left and Santyl to the areas on the right 2. I am really unclear about the etiology  of all of these wounds. His daughter does the dressing and per our intake nurses does a good job. We have repeated his arterial studies earlier this year that did not show just arterial insufficiency as a continuing etiology 3. No clear evidence of infection Electronic Signature(s) Signed: 08/18/2019 5:41:23 PM By: Linton Ham MD Entered By: Linton Ham on 08/18/2019 13:07:18 -------------------------------------------------------------------------------- SuperBill Details Patient Name: Date of Service: [name] [name] 08/18/2019 [name] [id number] Patient Account Number: [id number] Date of Birth/Sex: Treating RN: 1946/01/21 (74 y.o. 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 L97.521 Non-pressure chronic ulcer of other part of left foot limited to breakdown of skin L89.620 Pressure ulcer of left heel, unstageable L97.211 Non-pressure chronic ulcer of right calf limited to breakdown of skin S80.211D Abrasion, right knee, subsequent encounter Facility Procedures CPT4 Code Description: [id number] - DEB SUBQ TISSUE 20 SQ CM/< ICD-10 Diagnosis Description L97.221 Non-pressure chronic ulcer of left calf limited to breakdown L97.521 Non-pressure chronic ulcer of other part of left foot limited Modifier: of skin to breakdown Quantity: 1 of skin Physician Procedures CPT4 Code Description: [id number] - WC PHYS SUBQ TISS 20 SQ CM ICD-10 Diagnosis Description L97.221 Non-pressure chronic ulcer of left calf limited to breakdown L97.521 Non-pressure chronic ulcer of other part of left foot  limited Modifier: of skin to breakdown Quantity: 1 of skin Electronic Signature(s) Signed: 08/18/2019 5:41:23 PM By: Linton Ham MD Entered By: Linton Ham on 08/18/2019 13:07:53  right patella. This was apparently traumatic Electronic Signature(s) Signed: 08/18/2019 5:41:23 PM By: Linton Ham MD Entered By: Linton Ham on 08/18/2019 13:06:08 -------------------------------------------------------------------------------- Physician Orders Details Patient Name: Date of Service: [name] [name] 08/18/2019 9:15 AM [name] [id number] Patient Account Number: [id number] Date of Birth/Sex: Treating RN: [birth year] (74 y.o. 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 Dressing Change  Frequency Wound #14 Left,Lateral Lower Leg Change dressing three times week. Wound #18 Left,Proximal,Dorsal Foot Change dressing three times week. Wound #23 Right,Lateral Calcaneus Change Dressing every other day. Wound #24 Left Calcaneus Change dressing three times week. Wound #25 Right,Lateral Lower Leg Change Dressing every other day. Wound #26 Left,Distal Foot Change dressing three times week. Wound #27 Right,Lateral Knee Change dressing three times week. Skin Barriers/Peri-Wound Care Other: - TCA to left lower extrimity Wound Cleansing Clean wound with Normal Saline. May shower with protection. Primary Wound Dressing Wound #14 Left,Lateral Lower Leg Polymem Silver Wound #18 Left,Proximal,Dorsal Foot Polymem Silver Wound #23 Right,Lateral Calcaneus Santyl Ointment Wound #24 Left Calcaneus Polymem Silver Wound #25 Right,Lateral Lower Leg Santyl Ointment Wound #26 Left,Distal Foot Polymem Silver Wound #27 Right,Lateral Knee Polymem Silver Secondary Dressing Dry Gauze Other: - apply a triple antibiotic ointment and bandaid to left elbow skin tear. Edema Control Kerlix and Coban - Bilateral - use cotton roll not kerlix Avoid standing for long periods of time Elevate legs to the level of the heart or above for 30 minutes daily and/or when sitting, a frequency of: - throughout the day Off-Loading Multipodus Splint to: - patient to wear bunny boots to both feet while resting in chair and bed. Electronic Signature(s) Signed: 08/18/2019 5:28:23 PM By: [name] RN Signed: 08/18/2019 5:41:23 PM By: Linton Ham MD Entered By: [name] on 08/18/2019 10:34:19 -------------------------------------------------------------------------------- Problem List Details Patient Name: Date of Service: [name] [name] 08/18/2019 9:15 AM [name] [id number] Patient Account Number: [id number] Date of Birth/Sex: Treating RN: 03/22/1946 (74 y.o. [name] Primary Care Provider: [name], [name] Other Clinician: Referring Provider: Treating Provider/Extender:Purva Vessell [name], [name] in Treatment: 174 Active Problems ICD-10 Encounter Code Description Active Date MDM Diagnosis L89.613 Pressure ulcer of right heel, stage 3 04/17/2016 No Yes L97.221 Non-pressure chronic ulcer of left calf limited to 08/15/2017 No Yes breakdown of skin L97.321 Non-pressure chronic ulcer of left ankle limited to 07/01/2018 No Yes breakdown of skin L97.521 Non-pressure chronic ulcer of other part of left foot 02/17/2019 No Yes limited to breakdown of skin L89.620 Pressure ulcer of left heel, unstageable 07/21/2019 No Yes L97.211 Non-pressure chronic ulcer of right calf limited to 07/21/2019 No Yes breakdown of skin S80.211D Abrasion, right knee, subsequent encounter 08/18/2019 No Yes Inactive Problems ICD-10 Code Description Active Date Inactive Date E11.621 Type 2 diabetes mellitus with foot ulcer 04/17/2016 04/17/2016 L03.032 Cellulitis of left toe 01/06/2019 01/06/2019 L89.623 Pressure ulcer of left heel, stage 3 12/04/2016 12/04/2016 I25.119 Atherosclerotic heart disease of native coronary artery  right patella. This was apparently traumatic Electronic Signature(s) Signed: 08/18/2019 5:41:23 PM By: Linton Ham MD Entered By: Linton Ham on 08/18/2019 13:06:08 -------------------------------------------------------------------------------- Physician Orders Details Patient Name: Date of Service: [name] [name] 08/18/2019 9:15 AM [name] [id number] Patient Account Number: [id number] Date of Birth/Sex: Treating RN: [birth year] (74 y.o. 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 Dressing Change  Frequency Wound #14 Left,Lateral Lower Leg Change dressing three times week. Wound #18 Left,Proximal,Dorsal Foot Change dressing three times week. Wound #23 Right,Lateral Calcaneus Change Dressing every other day. Wound #24 Left Calcaneus Change dressing three times week. Wound #25 Right,Lateral Lower Leg Change Dressing every other day. Wound #26 Left,Distal Foot Change dressing three times week. Wound #27 Right,Lateral Knee Change dressing three times week. Skin Barriers/Peri-Wound Care Other: - TCA to left lower extrimity Wound Cleansing Clean wound with Normal Saline. May shower with protection. Primary Wound Dressing Wound #14 Left,Lateral Lower Leg Polymem Silver Wound #18 Left,Proximal,Dorsal Foot Polymem Silver Wound #23 Right,Lateral Calcaneus Santyl Ointment Wound #24 Left Calcaneus Polymem Silver Wound #25 Right,Lateral Lower Leg Santyl Ointment Wound #26 Left,Distal Foot Polymem Silver Wound #27 Right,Lateral Knee Polymem Silver Secondary Dressing Dry Gauze Other: - apply a triple antibiotic ointment and bandaid to left elbow skin tear. Edema Control Kerlix and Coban - Bilateral - use cotton roll not kerlix Avoid standing for long periods of time Elevate legs to the level of the heart or above for 30 minutes daily and/or when sitting, a frequency of: - throughout the day Off-Loading Multipodus Splint to: - patient to wear bunny boots to both feet while resting in chair and bed. Electronic Signature(s) Signed: 08/18/2019 5:28:23 PM By: [name] RN Signed: 08/18/2019 5:41:23 PM By: Linton Ham MD Entered By: [name] on 08/18/2019 10:34:19 -------------------------------------------------------------------------------- Problem List Details Patient Name: Date of Service: [name] [name] 08/18/2019 9:15 AM [name] [id number] Patient Account Number: [id number] Date of Birth/Sex: Treating RN: 03/22/1946 (74 y.o. [name] Primary Care Provider: [name], [name] Other Clinician: Referring Provider: Treating Provider/Extender:Purva Vessell [name], [name] in Treatment: 174 Active Problems ICD-10 Encounter Code Description Active Date MDM Diagnosis L89.613 Pressure ulcer of right heel, stage 3 04/17/2016 No Yes L97.221 Non-pressure chronic ulcer of left calf limited to 08/15/2017 No Yes breakdown of skin L97.321 Non-pressure chronic ulcer of left ankle limited to 07/01/2018 No Yes breakdown of skin L97.521 Non-pressure chronic ulcer of other part of left foot 02/17/2019 No Yes limited to breakdown of skin L89.620 Pressure ulcer of left heel, unstageable 07/21/2019 No Yes L97.211 Non-pressure chronic ulcer of right calf limited to 07/21/2019 No Yes breakdown of skin S80.211D Abrasion, right knee, subsequent encounter 08/18/2019 No Yes Inactive Problems ICD-10 Code Description Active Date Inactive Date E11.621 Type 2 diabetes mellitus with foot ulcer 04/17/2016 04/17/2016 L03.032 Cellulitis of left toe 01/06/2019 01/06/2019 L89.623 Pressure ulcer of left heel, stage 3 12/04/2016 12/04/2016 I25.119 Atherosclerotic heart disease of native coronary artery  week. Wound #27 Right,Lateral Knee: Change dressing three times week. Skin Barriers/Peri-Wound Care: Other: - TCA to left lower extrimity Wound Cleansing: Clean wound with Normal Saline. May shower with protection. Primary Wound Dressing: Wound #14 Left,Lateral Lower Leg: Polymem Silver Wound #18 Left,Proximal,Dorsal Foot: Polymem Silver Wound #23 Right,Lateral Calcaneus: Santyl Ointment Wound #24 Left Calcaneus: Polymem Silver Wound #25 Right,Lateral Lower Leg: Santyl Ointment Wound #26 Left,Distal Foot: Polymem Silver Wound #27 Right,Lateral Knee: Polymem Silver Secondary Dressing: Dry Gauze Other: - apply a triple antibiotic ointment and bandaid to left elbow skin tear. Edema Control: Kerlix and Coban - Bilateral - use cotton roll not kerlix Avoid standing for long periods of time Elevate legs to the level of the heart or above for 30 minutes daily and/or when sitting, a frequency of: - throughout the day Off-Loading: Multipodus Splint to: - patient to wear bunny boots to both feet while resting in chair and bed. 1. I continue with PolyMem to the areas on the left and Santyl to the areas on the right 2. I am really unclear about the etiology  of all of these wounds. His daughter does the dressing and per our intake nurses does a good job. We have repeated his arterial studies earlier this year that did not show just arterial insufficiency as a continuing etiology 3. No clear evidence of infection Electronic Signature(s) Signed: 08/18/2019 5:41:23 PM By: Linton Ham MD Entered By: Linton Ham on 08/18/2019 13:07:18 -------------------------------------------------------------------------------- SuperBill Details Patient Name: Date of Service: [name] [name] 08/18/2019 [name] [id number] Patient Account Number: [id number] Date of Birth/Sex: Treating RN: 1946/01/21 (74 y.o. 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 L97.521 Non-pressure chronic ulcer of other part of left foot limited to breakdown of skin L89.620 Pressure ulcer of left heel, unstageable L97.211 Non-pressure chronic ulcer of right calf limited to breakdown of skin S80.211D Abrasion, right knee, subsequent encounter Facility Procedures CPT4 Code Description: 21194174 11042 - DEB SUBQ TISSUE 20 SQ CM/< ICD-10 Diagnosis Description L97.221 Non-pressure chronic ulcer of left calf limited to breakdown L97.521 Non-pressure chronic ulcer of other part of left foot limited Modifier: of skin to breakdown Quantity: 1 of skin Physician Procedures CPT4 Code Description: 0814481 85631 - WC PHYS SUBQ TISS 20 SQ CM ICD-10 Diagnosis Description L97.221 Non-pressure chronic ulcer of left calf limited to breakdown L97.521 Non-pressure chronic ulcer of other part of left foot  limited Modifier: of skin to breakdown Quantity: 1 of skin Electronic Signature(s) Signed: 08/18/2019 5:41:23 PM By: Linton Ham MD Entered By: Linton Ham on 08/18/2019 13:07:53  week. Wound #27 Right,Lateral Knee: Change dressing three times week. Skin Barriers/Peri-Wound Care: Other: - TCA to left lower extrimity Wound Cleansing: Clean wound with Normal Saline. May shower with protection. Primary Wound Dressing: Wound #14 Left,Lateral Lower Leg: Polymem Silver Wound #18 Left,Proximal,Dorsal Foot: Polymem Silver Wound #23 Right,Lateral Calcaneus: Santyl Ointment Wound #24 Left Calcaneus: Polymem Silver Wound #25 Right,Lateral Lower Leg: Santyl Ointment Wound #26 Left,Distal Foot: Polymem Silver Wound #27 Right,Lateral Knee: Polymem Silver Secondary Dressing: Dry Gauze Other: - apply a triple antibiotic ointment and bandaid to left elbow skin tear. Edema Control: Kerlix and Coban - Bilateral - use cotton roll not kerlix Avoid standing for long periods of time Elevate legs to the level of the heart or above for 30 minutes daily and/or when sitting, a frequency of: - throughout the day Off-Loading: Multipodus Splint to: - patient to wear bunny boots to both feet while resting in chair and bed. 1. I continue with PolyMem to the areas on the left and Santyl to the areas on the right 2. I am really unclear about the etiology  of all of these wounds. His daughter does the dressing and per our intake nurses does a good job. We have repeated his arterial studies earlier this year that did not show just arterial insufficiency as a continuing etiology 3. No clear evidence of infection Electronic Signature(s) Signed: 08/18/2019 5:41:23 PM By: Linton Ham MD Entered By: Linton Ham on 08/18/2019 13:07:18 -------------------------------------------------------------------------------- SuperBill Details Patient Name: Date of Service: [name] [name] 08/18/2019 [name] [id number] Patient Account Number: [id number] Date of Birth/Sex: Treating RN: 1946/01/21 (74 y.o. 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 L97.521 Non-pressure chronic ulcer of other part of left foot limited to breakdown of skin L89.620 Pressure ulcer of left heel, unstageable L97.211 Non-pressure chronic ulcer of right calf limited to breakdown of skin S80.211D Abrasion, right knee, subsequent encounter Facility Procedures CPT4 Code Description: 21194174 11042 - DEB SUBQ TISSUE 20 SQ CM/< ICD-10 Diagnosis Description L97.221 Non-pressure chronic ulcer of left calf limited to breakdown L97.521 Non-pressure chronic ulcer of other part of left foot limited Modifier: of skin to breakdown Quantity: 1 of skin Physician Procedures CPT4 Code Description: 0814481 85631 - WC PHYS SUBQ TISS 20 SQ CM ICD-10 Diagnosis Description L97.221 Non-pressure chronic ulcer of left calf limited to breakdown L97.521 Non-pressure chronic ulcer of other part of left foot  limited Modifier: of skin to breakdown Quantity: 1 of skin Electronic Signature(s) Signed: 08/18/2019 5:41:23 PM By: Linton Ham MD Entered By: Linton Ham on 08/18/2019 13:07:53  week. Wound #27 Right,Lateral Knee: Change dressing three times week. Skin Barriers/Peri-Wound Care: Other: - TCA to left lower extrimity Wound Cleansing: Clean wound with Normal Saline. May shower with protection. Primary Wound Dressing: Wound #14 Left,Lateral Lower Leg: Polymem Silver Wound #18 Left,Proximal,Dorsal Foot: Polymem Silver Wound #23 Right,Lateral Calcaneus: Santyl Ointment Wound #24 Left Calcaneus: Polymem Silver Wound #25 Right,Lateral Lower Leg: Santyl Ointment Wound #26 Left,Distal Foot: Polymem Silver Wound #27 Right,Lateral Knee: Polymem Silver Secondary Dressing: Dry Gauze Other: - apply a triple antibiotic ointment and bandaid to left elbow skin tear. Edema Control: Kerlix and Coban - Bilateral - use cotton roll not kerlix Avoid standing for long periods of time Elevate legs to the level of the heart or above for 30 minutes daily and/or when sitting, a frequency of: - throughout the day Off-Loading: Multipodus Splint to: - patient to wear bunny boots to both feet while resting in chair and bed. 1. I continue with PolyMem to the areas on the left and Santyl to the areas on the right 2. I am really unclear about the etiology  of all of these wounds. His daughter does the dressing and per our intake nurses does a good job. We have repeated his arterial studies earlier this year that did not show just arterial insufficiency as a continuing etiology 3. No clear evidence of infection Electronic Signature(s) Signed: 08/18/2019 5:41:23 PM By: Linton Ham MD Entered By: Linton Ham on 08/18/2019 13:07:18 -------------------------------------------------------------------------------- SuperBill Details Patient Name: Date of Service: [name] [name] 08/18/2019 [name] [id number] Patient Account Number: [id number] Date of Birth/Sex: Treating RN: 1946/01/21 (74 y.o. 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 L97.521 Non-pressure chronic ulcer of other part of left foot limited to breakdown of skin L89.620 Pressure ulcer of left heel, unstageable L97.211 Non-pressure chronic ulcer of right calf limited to breakdown of skin S80.211D Abrasion, right knee, subsequent encounter Facility Procedures CPT4 Code Description: 21194174 11042 - DEB SUBQ TISSUE 20 SQ CM/< ICD-10 Diagnosis Description L97.221 Non-pressure chronic ulcer of left calf limited to breakdown L97.521 Non-pressure chronic ulcer of other part of left foot limited Modifier: of skin to breakdown Quantity: 1 of skin Physician Procedures CPT4 Code Description: 0814481 85631 - WC PHYS SUBQ TISS 20 SQ CM ICD-10 Diagnosis Description L97.221 Non-pressure chronic ulcer of left calf limited to breakdown L97.521 Non-pressure chronic ulcer of other part of left foot  limited Modifier: of skin to breakdown Quantity: 1 of skin Electronic Signature(s) Signed: 08/18/2019 5:41:23 PM By: Linton Ham MD Entered By: Linton Ham on 08/18/2019 13:07:53  week. Wound #27 Right,Lateral Knee: Change dressing three times week. Skin Barriers/Peri-Wound Care: Other: - TCA to left lower extrimity Wound Cleansing: Clean wound with Normal Saline. May shower with protection. Primary Wound Dressing: Wound #14 Left,Lateral Lower Leg: Polymem Silver Wound #18 Left,Proximal,Dorsal Foot: Polymem Silver Wound #23 Right,Lateral Calcaneus: Santyl Ointment Wound #24 Left Calcaneus: Polymem Silver Wound #25 Right,Lateral Lower Leg: Santyl Ointment Wound #26 Left,Distal Foot: Polymem Silver Wound #27 Right,Lateral Knee: Polymem Silver Secondary Dressing: Dry Gauze Other: - apply a triple antibiotic ointment and bandaid to left elbow skin tear. Edema Control: Kerlix and Coban - Bilateral - use cotton roll not kerlix Avoid standing for long periods of time Elevate legs to the level of the heart or above for 30 minutes daily and/or when sitting, a frequency of: - throughout the day Off-Loading: Multipodus Splint to: - patient to wear bunny boots to both feet while resting in chair and bed. 1. I continue with PolyMem to the areas on the left and Santyl to the areas on the right 2. I am really unclear about the etiology  of all of these wounds. His daughter does the dressing and per our intake nurses does a good job. We have repeated his arterial studies earlier this year that did not show just arterial insufficiency as a continuing etiology 3. No clear evidence of infection Electronic Signature(s) Signed: 08/18/2019 5:41:23 PM By: Linton Ham MD Entered By: Linton Ham on 08/18/2019 13:07:18 -------------------------------------------------------------------------------- SuperBill Details Patient Name: Date of Service: [name] [name] 08/18/2019 [name] [id number] Patient Account Number: [id number] Date of Birth/Sex: Treating RN: 1946/01/21 (74 y.o. 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 L97.521 Non-pressure chronic ulcer of other part of left foot limited to breakdown of skin L89.620 Pressure ulcer of left heel, unstageable L97.211 Non-pressure chronic ulcer of right calf limited to breakdown of skin S80.211D Abrasion, right knee, subsequent encounter Facility Procedures CPT4 Code Description: 21194174 11042 - DEB SUBQ TISSUE 20 SQ CM/< ICD-10 Diagnosis Description L97.221 Non-pressure chronic ulcer of left calf limited to breakdown L97.521 Non-pressure chronic ulcer of other part of left foot limited Modifier: of skin to breakdown Quantity: 1 of skin Physician Procedures CPT4 Code Description: 0814481 85631 - WC PHYS SUBQ TISS 20 SQ CM ICD-10 Diagnosis Description L97.221 Non-pressure chronic ulcer of left calf limited to breakdown L97.521 Non-pressure chronic ulcer of other part of left foot  limited Modifier: of skin to breakdown Quantity: 1 of skin Electronic Signature(s) Signed: 08/18/2019 5:41:23 PM By: Linton Ham MD Entered By: Linton Ham on 08/18/2019 13:07:53  right patella. This was apparently traumatic Electronic Signature(s) Signed: 08/18/2019 5:41:23 PM By: Linton Ham MD Entered By: Linton Ham on 08/18/2019 13:06:08 -------------------------------------------------------------------------------- Physician Orders Details Patient Name: Date of Service: [name] [name] 08/18/2019 9:15 AM [name] [id number] Patient Account Number: [id number] Date of Birth/Sex: Treating RN: [birth year] (74 y.o. 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 Dressing Change  Frequency Wound #14 Left,Lateral Lower Leg Change dressing three times week. Wound #18 Left,Proximal,Dorsal Foot Change dressing three times week. Wound #23 Right,Lateral Calcaneus Change Dressing every other day. Wound #24 Left Calcaneus Change dressing three times week. Wound #25 Right,Lateral Lower Leg Change Dressing every other day. Wound #26 Left,Distal Foot Change dressing three times week. Wound #27 Right,Lateral Knee Change dressing three times week. Skin Barriers/Peri-Wound Care Other: - TCA to left lower extrimity Wound Cleansing Clean wound with Normal Saline. May shower with protection. Primary Wound Dressing Wound #14 Left,Lateral Lower Leg Polymem Silver Wound #18 Left,Proximal,Dorsal Foot Polymem Silver Wound #23 Right,Lateral Calcaneus Santyl Ointment Wound #24 Left Calcaneus Polymem Silver Wound #25 Right,Lateral Lower Leg Santyl Ointment Wound #26 Left,Distal Foot Polymem Silver Wound #27 Right,Lateral Knee Polymem Silver Secondary Dressing Dry Gauze Other: - apply a triple antibiotic ointment and bandaid to left elbow skin tear. Edema Control Kerlix and Coban - Bilateral - use cotton roll not kerlix Avoid standing for long periods of time Elevate legs to the level of the heart or above for 30 minutes daily and/or when sitting, a frequency of: - throughout the day Off-Loading Multipodus Splint to: - patient to wear bunny boots to both feet while resting in chair and bed. Electronic Signature(s) Signed: 08/18/2019 5:28:23 PM By: [name] RN Signed: 08/18/2019 5:41:23 PM By: Linton Ham MD Entered By: [name] on 08/18/2019 10:34:19 -------------------------------------------------------------------------------- Problem List Details Patient Name: Date of Service: [name] [name] 08/18/2019 9:15 AM [name] [id number] Patient Account Number: [id number] Date of Birth/Sex: Treating RN: 03/22/1946 (74 y.o. [name] Primary Care Provider: [name], [name] Other Clinician: Referring Provider: Treating Provider/Extender:Purva Vessell [name], [name] in Treatment: 174 Active Problems ICD-10 Encounter Code Description Active Date MDM Diagnosis L89.613 Pressure ulcer of right heel, stage 3 04/17/2016 No Yes L97.221 Non-pressure chronic ulcer of left calf limited to 08/15/2017 No Yes breakdown of skin L97.321 Non-pressure chronic ulcer of left ankle limited to 07/01/2018 No Yes breakdown of skin L97.521 Non-pressure chronic ulcer of other part of left foot 02/17/2019 No Yes limited to breakdown of skin L89.620 Pressure ulcer of left heel, unstageable 07/21/2019 No Yes L97.211 Non-pressure chronic ulcer of right calf limited to 07/21/2019 No Yes breakdown of skin S80.211D Abrasion, right knee, subsequent encounter 08/18/2019 No Yes Inactive Problems ICD-10 Code Description Active Date Inactive Date E11.621 Type 2 diabetes mellitus with foot ulcer 04/17/2016 04/17/2016 L03.032 Cellulitis of left toe 01/06/2019 01/06/2019 L89.623 Pressure ulcer of left heel, stage 3 12/04/2016 12/04/2016 I25.119 Atherosclerotic heart disease of native coronary artery  week. Wound #27 Right,Lateral Knee: Change dressing three times week. Skin Barriers/Peri-Wound Care: Other: - TCA to left lower extrimity Wound Cleansing: Clean wound with Normal Saline. May shower with protection. Primary Wound Dressing: Wound #14 Left,Lateral Lower Leg: Polymem Silver Wound #18 Left,Proximal,Dorsal Foot: Polymem Silver Wound #23 Right,Lateral Calcaneus: Santyl Ointment Wound #24 Left Calcaneus: Polymem Silver Wound #25 Right,Lateral Lower Leg: Santyl Ointment Wound #26 Left,Distal Foot: Polymem Silver Wound #27 Right,Lateral Knee: Polymem Silver Secondary Dressing: Dry Gauze Other: - apply a triple antibiotic ointment and bandaid to left elbow skin tear. Edema Control: Kerlix and Coban - Bilateral - use cotton roll not kerlix Avoid standing for long periods of time Elevate legs to the level of the heart or above for 30 minutes daily and/or when sitting, a frequency of: - throughout the day Off-Loading: Multipodus Splint to: - patient to wear bunny boots to both feet while resting in chair and bed. 1. I continue with PolyMem to the areas on the left and Santyl to the areas on the right 2. I am really unclear about the etiology  of all of these wounds. His daughter does the dressing and per our intake nurses does a good job. We have repeated his arterial studies earlier this year that did not show just arterial insufficiency as a continuing etiology 3. No clear evidence of infection Electronic Signature(s) Signed: 08/18/2019 5:41:23 PM By: Linton Ham MD Entered By: Linton Ham on 08/18/2019 13:07:18 -------------------------------------------------------------------------------- SuperBill Details Patient Name: Date of Service: [name] [name] 08/18/2019 [name] [id number] Patient Account Number: [id number] Date of Birth/Sex: Treating RN: 1946/01/21 (74 y.o. 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 L97.521 Non-pressure chronic ulcer of other part of left foot limited to breakdown of skin L89.620 Pressure ulcer of left heel, unstageable L97.211 Non-pressure chronic ulcer of right calf limited to breakdown of skin S80.211D Abrasion, right knee, subsequent encounter Facility Procedures CPT4 Code Description: 21194174 11042 - DEB SUBQ TISSUE 20 SQ CM/< ICD-10 Diagnosis Description L97.221 Non-pressure chronic ulcer of left calf limited to breakdown L97.521 Non-pressure chronic ulcer of other part of left foot limited Modifier: of skin to breakdown Quantity: 1 of skin Physician Procedures CPT4 Code Description: 0814481 85631 - WC PHYS SUBQ TISS 20 SQ CM ICD-10 Diagnosis Description L97.221 Non-pressure chronic ulcer of left calf limited to breakdown L97.521 Non-pressure chronic ulcer of other part of left foot  limited Modifier: of skin to breakdown Quantity: 1 of skin Electronic Signature(s) Signed: 08/18/2019 5:41:23 PM By: Linton Ham MD Entered By: Linton Ham on 08/18/2019 13:07:53  week. Wound #27 Right,Lateral Knee: Change dressing three times week. Skin Barriers/Peri-Wound Care: Other: - TCA to left lower extrimity Wound Cleansing: Clean wound with Normal Saline. May shower with protection. Primary Wound Dressing: Wound #14 Left,Lateral Lower Leg: Polymem Silver Wound #18 Left,Proximal,Dorsal Foot: Polymem Silver Wound #23 Right,Lateral Calcaneus: Santyl Ointment Wound #24 Left Calcaneus: Polymem Silver Wound #25 Right,Lateral Lower Leg: Santyl Ointment Wound #26 Left,Distal Foot: Polymem Silver Wound #27 Right,Lateral Knee: Polymem Silver Secondary Dressing: Dry Gauze Other: - apply a triple antibiotic ointment and bandaid to left elbow skin tear. Edema Control: Kerlix and Coban - Bilateral - use cotton roll not kerlix Avoid standing for long periods of time Elevate legs to the level of the heart or above for 30 minutes daily and/or when sitting, a frequency of: - throughout the day Off-Loading: Multipodus Splint to: - patient to wear bunny boots to both feet while resting in chair and bed. 1. I continue with PolyMem to the areas on the left and Santyl to the areas on the right 2. I am really unclear about the etiology  of all of these wounds. His daughter does the dressing and per our intake nurses does a good job. We have repeated his arterial studies earlier this year that did not show just arterial insufficiency as a continuing etiology 3. No clear evidence of infection Electronic Signature(s) Signed: 08/18/2019 5:41:23 PM By: Linton Ham MD Entered By: Linton Ham on 08/18/2019 13:07:18 -------------------------------------------------------------------------------- SuperBill Details Patient Name: Date of Service: [name] [name] 08/18/2019 [name] [id number] Patient Account Number: [id number] Date of Birth/Sex: Treating RN: 1946/01/21 (74 y.o. 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 L97.521 Non-pressure chronic ulcer of other part of left foot limited to breakdown of skin L89.620 Pressure ulcer of left heel, unstageable L97.211 Non-pressure chronic ulcer of right calf limited to breakdown of skin S80.211D Abrasion, right knee, subsequent encounter Facility Procedures CPT4 Code Description: 21194174 11042 - DEB SUBQ TISSUE 20 SQ CM/< ICD-10 Diagnosis Description L97.221 Non-pressure chronic ulcer of left calf limited to breakdown L97.521 Non-pressure chronic ulcer of other part of left foot limited Modifier: of skin to breakdown Quantity: 1 of skin Physician Procedures CPT4 Code Description: 0814481 85631 - WC PHYS SUBQ TISS 20 SQ CM ICD-10 Diagnosis Description L97.221 Non-pressure chronic ulcer of left calf limited to breakdown L97.521 Non-pressure chronic ulcer of other part of left foot  limited Modifier: of skin to breakdown Quantity: 1 of skin Electronic Signature(s) Signed: 08/18/2019 5:41:23 PM By: Linton Ham MD Entered By: Linton Ham on 08/18/2019 13:07:53  using Sorbact to all wound areas. He had a significant excoriated rash on his left leg when he was here last time. We changed from Kerlix to cotton under the Coban and this seems to have helped 2/2; 1 month follow-up. The patient has open areas on the left lateral tibial area mid lower leg, left dorsal foot/ankle and an area on the right lateral calcaneus. The right lateral calcaneus is better as is the left dorsal foot/ankle. The area on the left lateral tibia however is larger. 3/9; 1 month follow-up. The left lateral tibia looks mostly the same. Left dorsal foot and ankle improved and the area on the right lateral calcaneus is quite a bit worse. He has had previous arterial studies last year that were fairly normal. I continue to think that these are pressure areas. We have been using silver alginate on all the wounds 3/30; 3-week follow-up. The left lateral tibia is still open but with less width. Left dorsal foot small punched out wound. He has a large area  on his right lateral calcaneus and an area that I did not pick up on last time on the tip of the left calcaneus As well he has a new area on the right lateral calf which was new today. 4/13; this is a patient with multiple medical problems. He has been in this clinic for a very prolonged period of time with different wounds in different areas in his lower extremities. Currently we have the left lateral tibia, left heel, left dorsal foot, right lateral heel and right lateral lower tibia. We have been using Santyl on the right and silver alginate on the left He has his daughter who changes his dressings. The reason behind the nonhealing here and ulcer recurrence is really never been clear to me. The patient is debilitated. He uses bunny boots apparently at night to protect his heels and feet. His arterial studies have previously not suggested a primary arterial etiology 4/27 2-week follow-up. He has a new wound on the right anterior patella currently trauma against the wheelchair. Failing this had he has areas on the right lateral calf and right lateral calcaneus both of which we have been using Santyl. On the left side one of the original wounds on the left lateral tibia area, left calcaneus, punched out areas on the left dorsal foot. On the left we have been using PolyMem Ag Electronic Signature(s) Signed: 08/18/2019 5:41:23 PM By: Linton Ham MD Entered By: Linton Ham on 08/18/2019 13:04:35 -------------------------------------------------------------------------------- Physical Exam Details Patient Name: Date of Service: [name] [name] 08/18/2019 9:15 AM [name] [id number] Patient Account Number: [id number] Date of Birth/Sex: Treating RN: 11/18/1945 (74 y.o. [name] Primary Care Provider: [name], [name] Other Clinician: Referring Provider: Treating Provider/Extender:Latreece Mochizuki [name], [name] in Treatment: 174 Respiratory work of breathing is  normal. Cardiovascular Her Salas pedis pulses are palpable. Integumentary (Hair, Skin) No primary cutaneous issues. Notes Wound exam On the left foot of the left lateral wound parallel to the tibia no better. There are some rims of epithelialization. The wound is long superficial I have not been able to get this to reliably epithelialized The deep wrap abrasion on the left dorsal foot has several open areas here. They require debridement with a #3 curette The area on the left heel looks satisfactory no debridement was required On the right lateral heel and right lateral lower leg both of these require debridement although the wounds look better using Santyl Small circular open area on the  week. Wound #27 Right,Lateral Knee: Change dressing three times week. Skin Barriers/Peri-Wound Care: Other: - TCA to left lower extrimity Wound Cleansing: Clean wound with Normal Saline. May shower with protection. Primary Wound Dressing: Wound #14 Left,Lateral Lower Leg: Polymem Silver Wound #18 Left,Proximal,Dorsal Foot: Polymem Silver Wound #23 Right,Lateral Calcaneus: Santyl Ointment Wound #24 Left Calcaneus: Polymem Silver Wound #25 Right,Lateral Lower Leg: Santyl Ointment Wound #26 Left,Distal Foot: Polymem Silver Wound #27 Right,Lateral Knee: Polymem Silver Secondary Dressing: Dry Gauze Other: - apply a triple antibiotic ointment and bandaid to left elbow skin tear. Edema Control: Kerlix and Coban - Bilateral - use cotton roll not kerlix Avoid standing for long periods of time Elevate legs to the level of the heart or above for 30 minutes daily and/or when sitting, a frequency of: - throughout the day Off-Loading: Multipodus Splint to: - patient to wear bunny boots to both feet while resting in chair and bed. 1. I continue with PolyMem to the areas on the left and Santyl to the areas on the right 2. I am really unclear about the etiology  of all of these wounds. His daughter does the dressing and per our intake nurses does a good job. We have repeated his arterial studies earlier this year that did not show just arterial insufficiency as a continuing etiology 3. No clear evidence of infection Electronic Signature(s) Signed: 08/18/2019 5:41:23 PM By: Linton Ham MD Entered By: Linton Ham on 08/18/2019 13:07:18 -------------------------------------------------------------------------------- SuperBill Details Patient Name: Date of Service: [name] [name] 08/18/2019 [name] [id number] Patient Account Number: [id number] Date of Birth/Sex: Treating RN: 1946/01/21 (74 y.o. 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 L97.521 Non-pressure chronic ulcer of other part of left foot limited to breakdown of skin L89.620 Pressure ulcer of left heel, unstageable L97.211 Non-pressure chronic ulcer of right calf limited to breakdown of skin S80.211D Abrasion, right knee, subsequent encounter Facility Procedures CPT4 Code Description: 21194174 11042 - DEB SUBQ TISSUE 20 SQ CM/< ICD-10 Diagnosis Description L97.221 Non-pressure chronic ulcer of left calf limited to breakdown L97.521 Non-pressure chronic ulcer of other part of left foot limited Modifier: of skin to breakdown Quantity: 1 of skin Physician Procedures CPT4 Code Description: 0814481 85631 - WC PHYS SUBQ TISS 20 SQ CM ICD-10 Diagnosis Description L97.221 Non-pressure chronic ulcer of left calf limited to breakdown L97.521 Non-pressure chronic ulcer of other part of left foot  limited Modifier: of skin to breakdown Quantity: 1 of skin Electronic Signature(s) Signed: 08/18/2019 5:41:23 PM By: Linton Ham MD Entered By: Linton Ham on 08/18/2019 13:07:53  right patella. This was apparently traumatic Electronic Signature(s) Signed: 08/18/2019 5:41:23 PM By: Linton Ham MD Entered By: Linton Ham on 08/18/2019 13:06:08 -------------------------------------------------------------------------------- Physician Orders Details Patient Name: Date of Service: [name] [name] 08/18/2019 9:15 AM [name] [id number] Patient Account Number: [id number] Date of Birth/Sex: Treating RN: [birth year] (74 y.o. 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 Dressing Change  Frequency Wound #14 Left,Lateral Lower Leg Change dressing three times week. Wound #18 Left,Proximal,Dorsal Foot Change dressing three times week. Wound #23 Right,Lateral Calcaneus Change Dressing every other day. Wound #24 Left Calcaneus Change dressing three times week. Wound #25 Right,Lateral Lower Leg Change Dressing every other day. Wound #26 Left,Distal Foot Change dressing three times week. Wound #27 Right,Lateral Knee Change dressing three times week. Skin Barriers/Peri-Wound Care Other: - TCA to left lower extrimity Wound Cleansing Clean wound with Normal Saline. May shower with protection. Primary Wound Dressing Wound #14 Left,Lateral Lower Leg Polymem Silver Wound #18 Left,Proximal,Dorsal Foot Polymem Silver Wound #23 Right,Lateral Calcaneus Santyl Ointment Wound #24 Left Calcaneus Polymem Silver Wound #25 Right,Lateral Lower Leg Santyl Ointment Wound #26 Left,Distal Foot Polymem Silver Wound #27 Right,Lateral Knee Polymem Silver Secondary Dressing Dry Gauze Other: - apply a triple antibiotic ointment and bandaid to left elbow skin tear. Edema Control Kerlix and Coban - Bilateral - use cotton roll not kerlix Avoid standing for long periods of time Elevate legs to the level of the heart or above for 30 minutes daily and/or when sitting, a frequency of: - throughout the day Off-Loading Multipodus Splint to: - patient to wear bunny boots to both feet while resting in chair and bed. Electronic Signature(s) Signed: 08/18/2019 5:28:23 PM By: [name] RN Signed: 08/18/2019 5:41:23 PM By: Linton Ham MD Entered By: [name] on 08/18/2019 10:34:19 -------------------------------------------------------------------------------- Problem List Details Patient Name: Date of Service: [name] [name] 08/18/2019 9:15 AM [name] [id number] Patient Account Number: [id number] Date of Birth/Sex: Treating RN: 03/22/1946 (74 y.o. [name] Primary Care Provider: [name], [name] Other Clinician: Referring Provider: Treating Provider/Extender:Purva Vessell [name], [name] in Treatment: 174 Active Problems ICD-10 Encounter Code Description Active Date MDM Diagnosis L89.613 Pressure ulcer of right heel, stage 3 04/17/2016 No Yes L97.221 Non-pressure chronic ulcer of left calf limited to 08/15/2017 No Yes breakdown of skin L97.321 Non-pressure chronic ulcer of left ankle limited to 07/01/2018 No Yes breakdown of skin L97.521 Non-pressure chronic ulcer of other part of left foot 02/17/2019 No Yes limited to breakdown of skin L89.620 Pressure ulcer of left heel, unstageable 07/21/2019 No Yes L97.211 Non-pressure chronic ulcer of right calf limited to 07/21/2019 No Yes breakdown of skin S80.211D Abrasion, right knee, subsequent encounter 08/18/2019 No Yes Inactive Problems ICD-10 Code Description Active Date Inactive Date E11.621 Type 2 diabetes mellitus with foot ulcer 04/17/2016 04/17/2016 L03.032 Cellulitis of left toe 01/06/2019 01/06/2019 L89.623 Pressure ulcer of left heel, stage 3 12/04/2016 12/04/2016 I25.119 Atherosclerotic heart disease of native coronary artery  using Sorbact to all wound areas. He had a significant excoriated rash on his left leg when he was here last time. We changed from Kerlix to cotton under the Coban and this seems to have helped 2/2; 1 month follow-up. The patient has open areas on the left lateral tibial area mid lower leg, left dorsal foot/ankle and an area on the right lateral calcaneus. The right lateral calcaneus is better as is the left dorsal foot/ankle. The area on the left lateral tibia however is larger. 3/9; 1 month follow-up. The left lateral tibia looks mostly the same. Left dorsal foot and ankle improved and the area on the right lateral calcaneus is quite a bit worse. He has had previous arterial studies last year that were fairly normal. I continue to think that these are pressure areas. We have been using silver alginate on all the wounds 3/30; 3-week follow-up. The left lateral tibia is still open but with less width. Left dorsal foot small punched out wound. He has a large area  on his right lateral calcaneus and an area that I did not pick up on last time on the tip of the left calcaneus As well he has a new area on the right lateral calf which was new today. 4/13; this is a patient with multiple medical problems. He has been in this clinic for a very prolonged period of time with different wounds in different areas in his lower extremities. Currently we have the left lateral tibia, left heel, left dorsal foot, right lateral heel and right lateral lower tibia. We have been using Santyl on the right and silver alginate on the left He has his daughter who changes his dressings. The reason behind the nonhealing here and ulcer recurrence is really never been clear to me. The patient is debilitated. He uses bunny boots apparently at night to protect his heels and feet. His arterial studies have previously not suggested a primary arterial etiology 4/27 2-week follow-up. He has a new wound on the right anterior patella currently trauma against the wheelchair. Failing this had he has areas on the right lateral calf and right lateral calcaneus both of which we have been using Santyl. On the left side one of the original wounds on the left lateral tibia area, left calcaneus, punched out areas on the left dorsal foot. On the left we have been using PolyMem Ag Electronic Signature(s) Signed: 08/18/2019 5:41:23 PM By: Linton Ham MD Entered By: Linton Ham on 08/18/2019 13:04:35 -------------------------------------------------------------------------------- Physical Exam Details Patient Name: Date of Service: [name] [name] 08/18/2019 9:15 AM [name] [id number] Patient Account Number: [id number] Date of Birth/Sex: Treating RN: 11/18/1945 (74 y.o. [name] Primary Care Provider: [name], [name] Other Clinician: Referring Provider: Treating Provider/Extender:Latreece Mochizuki [name], [name] in Treatment: 174 Respiratory work of breathing is  normal. Cardiovascular Her Salas pedis pulses are palpable. Integumentary (Hair, Skin) No primary cutaneous issues. Notes Wound exam On the left foot of the left lateral wound parallel to the tibia no better. There are some rims of epithelialization. The wound is long superficial I have not been able to get this to reliably epithelialized The deep wrap abrasion on the left dorsal foot has several open areas here. They require debridement with a #3 curette The area on the left heel looks satisfactory no debridement was required On the right lateral heel and right lateral lower leg both of these require debridement although the wounds look better using Santyl Small circular open area on the  week. Wound #27 Right,Lateral Knee: Change dressing three times week. Skin Barriers/Peri-Wound Care: Other: - TCA to left lower extrimity Wound Cleansing: Clean wound with Normal Saline. May shower with protection. Primary Wound Dressing: Wound #14 Left,Lateral Lower Leg: Polymem Silver Wound #18 Left,Proximal,Dorsal Foot: Polymem Silver Wound #23 Right,Lateral Calcaneus: Santyl Ointment Wound #24 Left Calcaneus: Polymem Silver Wound #25 Right,Lateral Lower Leg: Santyl Ointment Wound #26 Left,Distal Foot: Polymem Silver Wound #27 Right,Lateral Knee: Polymem Silver Secondary Dressing: Dry Gauze Other: - apply a triple antibiotic ointment and bandaid to left elbow skin tear. Edema Control: Kerlix and Coban - Bilateral - use cotton roll not kerlix Avoid standing for long periods of time Elevate legs to the level of the heart or above for 30 minutes daily and/or when sitting, a frequency of: - throughout the day Off-Loading: Multipodus Splint to: - patient to wear bunny boots to both feet while resting in chair and bed. 1. I continue with PolyMem to the areas on the left and Santyl to the areas on the right 2. I am really unclear about the etiology  of all of these wounds. His daughter does the dressing and per our intake nurses does a good job. We have repeated his arterial studies earlier this year that did not show just arterial insufficiency as a continuing etiology 3. No clear evidence of infection Electronic Signature(s) Signed: 08/18/2019 5:41:23 PM By: Linton Ham MD Entered By: Linton Ham on 08/18/2019 13:07:18 -------------------------------------------------------------------------------- SuperBill Details Patient Name: Date of Service: [name] [name] 08/18/2019 [name] [id number] Patient Account Number: [id number] Date of Birth/Sex: Treating RN: 1946/01/21 (74 y.o. 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 L97.521 Non-pressure chronic ulcer of other part of left foot limited to breakdown of skin L89.620 Pressure ulcer of left heel, unstageable L97.211 Non-pressure chronic ulcer of right calf limited to breakdown of skin S80.211D Abrasion, right knee, subsequent encounter Facility Procedures CPT4 Code Description: [id number] 11042 - DEB SUBQ TISSUE 20 SQ CM/< ICD-10 Diagnosis Description L97.221 Non-pressure chronic ulcer of left calf limited to breakdown L97.521 Non-pressure chronic ulcer of other part of left foot limited Modifier: of skin to breakdown Quantity: 1 of skin Physician Procedures CPT4 Code Description: [id number] 85631 - WC PHYS SUBQ TISS 20 SQ CM ICD-10 Diagnosis Description L97.221 Non-pressure chronic ulcer of left calf limited to breakdown L97.521 Non-pressure chronic ulcer of other part of left foot  limited Modifier: of skin to breakdown Quantity: 1 of skin Electronic Signature(s) Signed: 08/18/2019 5:41:23 PM By: Linton Ham MD Entered By: Linton Ham on 08/18/2019 13:07:53  week. Wound #27 Right,Lateral Knee: Change dressing three times week. Skin Barriers/Peri-Wound Care: Other: - TCA to left lower extrimity Wound Cleansing: Clean wound with Normal Saline. May shower with protection. Primary Wound Dressing: Wound #14 Left,Lateral Lower Leg: Polymem Silver Wound #18 Left,Proximal,Dorsal Foot: Polymem Silver Wound #23 Right,Lateral Calcaneus: Santyl Ointment Wound #24 Left Calcaneus: Polymem Silver Wound #25 Right,Lateral Lower Leg: Santyl Ointment Wound #26 Left,Distal Foot: Polymem Silver Wound #27 Right,Lateral Knee: Polymem Silver Secondary Dressing: Dry Gauze Other: - apply a triple antibiotic ointment and bandaid to left elbow skin tear. Edema Control: Kerlix and Coban - Bilateral - use cotton roll not kerlix Avoid standing for long periods of time Elevate legs to the level of the heart or above for 30 minutes daily and/or when sitting, a frequency of: - throughout the day Off-Loading: Multipodus Splint to: - patient to wear bunny boots to both feet while resting in chair and bed. 1. I continue with PolyMem to the areas on the left and Santyl to the areas on the right 2. I am really unclear about the etiology  of all of these wounds. His daughter does the dressing and per our intake nurses does a good job. We have repeated his arterial studies earlier this year that did not show just arterial insufficiency as a continuing etiology 3. No clear evidence of infection Electronic Signature(s) Signed: 08/18/2019 5:41:23 PM By: Linton Ham MD Entered By: Linton Ham on 08/18/2019 13:07:18 -------------------------------------------------------------------------------- SuperBill Details Patient Name: Date of Service: [name] [name] 08/18/2019 [name] [id number] Patient Account Number: [id number] Date of Birth/Sex: Treating RN: 1946/01/21 (74 y.o. 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 L97.521 Non-pressure chronic ulcer of other part of left foot limited to breakdown of skin L89.620 Pressure ulcer of left heel, unstageable L97.211 Non-pressure chronic ulcer of right calf limited to breakdown of skin S80.211D Abrasion, right knee, subsequent encounter Facility Procedures CPT4 Code Description: 21194174 11042 - DEB SUBQ TISSUE 20 SQ CM/< ICD-10 Diagnosis Description L97.221 Non-pressure chronic ulcer of left calf limited to breakdown L97.521 Non-pressure chronic ulcer of other part of left foot limited Modifier: of skin to breakdown Quantity: 1 of skin Physician Procedures CPT4 Code Description: [id number] 85631 - WC PHYS SUBQ TISS 20 SQ CM ICD-10 Diagnosis Description L97.221 Non-pressure chronic ulcer of left calf limited to breakdown L97.521 Non-pressure chronic ulcer of other part of left foot  limited Modifier: of skin to breakdown Quantity: 1 of skin Electronic Signature(s) Signed: 08/18/2019 5:41:23 PM By: Linton Ham MD Entered By: Linton Ham on 08/18/2019 13:07:53  using Sorbact to all wound areas. He had a significant excoriated rash on his left leg when he was here last time. We changed from Kerlix to cotton under the Coban and this seems to have helped 2/2; 1 month follow-up. The patient has open areas on the left lateral tibial area mid lower leg, left dorsal foot/ankle and an area on the right lateral calcaneus. The right lateral calcaneus is better as is the left dorsal foot/ankle. The area on the left lateral tibia however is larger. 3/9; 1 month follow-up. The left lateral tibia looks mostly the same. Left dorsal foot and ankle improved and the area on the right lateral calcaneus is quite a bit worse. He has had previous arterial studies last year that were fairly normal. I continue to think that these are pressure areas. We have been using silver alginate on all the wounds 3/30; 3-week follow-up. The left lateral tibia is still open but with less width. Left dorsal foot small punched out wound. He has a large area  on his right lateral calcaneus and an area that I did not pick up on last time on the tip of the left calcaneus As well he has a new area on the right lateral calf which was new today. 4/13; this is a patient with multiple medical problems. He has been in this clinic for a very prolonged period of time with different wounds in different areas in his lower extremities. Currently we have the left lateral tibia, left heel, left dorsal foot, right lateral heel and right lateral lower tibia. We have been using Santyl on the right and silver alginate on the left He has his daughter who changes his dressings. The reason behind the nonhealing here and ulcer recurrence is really never been clear to me. The patient is debilitated. He uses bunny boots apparently at night to protect his heels and feet. His arterial studies have previously not suggested a primary arterial etiology 4/27 2-week follow-up. He has a new wound on the right anterior patella currently trauma against the wheelchair. Failing this had he has areas on the right lateral calf and right lateral calcaneus both of which we have been using Santyl. On the left side one of the original wounds on the left lateral tibia area, left calcaneus, punched out areas on the left dorsal foot. On the left we have been using PolyMem Ag Electronic Signature(s) Signed: 08/18/2019 5:41:23 PM By: Linton Ham MD Entered By: Linton Ham on 08/18/2019 13:04:35 -------------------------------------------------------------------------------- Physical Exam Details Patient Name: Date of Service: [name] [name] 08/18/2019 9:15 AM [name] [id number] Patient Account Number: [id number] Date of Birth/Sex: Treating RN: 11/18/1945 (74 y.o. [name] Primary Care Provider: [name], [name] Other Clinician: Referring Provider: Treating Provider/Extender:Latreece Mochizuki [name], [name] in Treatment: 174 Respiratory work of breathing is  normal. Cardiovascular Her Salas pedis pulses are palpable. Integumentary (Hair, Skin) No primary cutaneous issues. Notes Wound exam On the left foot of the left lateral wound parallel to the tibia no better. There are some rims of epithelialization. The wound is long superficial I have not been able to get this to reliably epithelialized The deep wrap abrasion on the left dorsal foot has several open areas here. They require debridement with a #3 curette The area on the left heel looks satisfactory no debridement was required On the right lateral heel and right lateral lower leg both of these require debridement although the wounds look better using Santyl Small circular open area on the  week. Wound #27 Right,Lateral Knee: Change dressing three times week. Skin Barriers/Peri-Wound Care: Other: - TCA to left lower extrimity Wound Cleansing: Clean wound with Normal Saline. May shower with protection. Primary Wound Dressing: Wound #14 Left,Lateral Lower Leg: Polymem Silver Wound #18 Left,Proximal,Dorsal Foot: Polymem Silver Wound #23 Right,Lateral Calcaneus: Santyl Ointment Wound #24 Left Calcaneus: Polymem Silver Wound #25 Right,Lateral Lower Leg: Santyl Ointment Wound #26 Left,Distal Foot: Polymem Silver Wound #27 Right,Lateral Knee: Polymem Silver Secondary Dressing: Dry Gauze Other: - apply a triple antibiotic ointment and bandaid to left elbow skin tear. Edema Control: Kerlix and Coban - Bilateral - use cotton roll not kerlix Avoid standing for long periods of time Elevate legs to the level of the heart or above for 30 minutes daily and/or when sitting, a frequency of: - throughout the day Off-Loading: Multipodus Splint to: - patient to wear bunny boots to both feet while resting in chair and bed. 1. I continue with PolyMem to the areas on the left and Santyl to the areas on the right 2. I am really unclear about the etiology  of all of these wounds. His daughter does the dressing and per our intake nurses does a good job. We have repeated his arterial studies earlier this year that did not show just arterial insufficiency as a continuing etiology 3. No clear evidence of infection Electronic Signature(s) Signed: 08/18/2019 5:41:23 PM By: Linton Ham MD Entered By: Linton Ham on 08/18/2019 13:07:18 -------------------------------------------------------------------------------- SuperBill Details Patient Name: Date of Service: [name] [name] 08/18/2019 [name] [id number] Patient Account Number: [id number] Date of Birth/Sex: Treating RN: 1946/01/21 (74 y.o. 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 L97.521 Non-pressure chronic ulcer of other part of left foot limited to breakdown of skin L89.620 Pressure ulcer of left heel, unstageable L97.211 Non-pressure chronic ulcer of right calf limited to breakdown of skin S80.211D Abrasion, right knee, subsequent encounter Facility Procedures CPT4 Code Description: 21194174 11042 - DEB SUBQ TISSUE 20 SQ CM/< ICD-10 Diagnosis Description L97.221 Non-pressure chronic ulcer of left calf limited to breakdown L97.521 Non-pressure chronic ulcer of other part of left foot limited Modifier: of skin to breakdown Quantity: 1 of skin Physician Procedures CPT4 Code Description: [id number] - WC PHYS SUBQ TISS 20 SQ CM ICD-10 Diagnosis Description L97.221 Non-pressure chronic ulcer of left calf limited to breakdown L97.521 Non-pressure chronic ulcer of other part of left foot  limited Modifier: of skin to breakdown Quantity: 1 of skin Electronic Signature(s) Signed: 08/18/2019 5:41:23 PM By: Linton Ham MD Entered By: Linton Ham on 08/18/2019 13:07:53

## 2019-08-19 ENCOUNTER — Ambulatory Visit: Payer: Medicare HMO

## 2019-08-19 DIAGNOSIS — I472 Ventricular tachycardia, unspecified: Secondary | ICD-10-CM

## 2019-08-19 DIAGNOSIS — IMO0002 Reserved for concepts with insufficient information to code with codable children: Secondary | ICD-10-CM

## 2019-08-19 DIAGNOSIS — E1165 Type 2 diabetes mellitus with hyperglycemia: Secondary | ICD-10-CM

## 2019-08-19 DIAGNOSIS — E785 Hyperlipidemia, unspecified: Secondary | ICD-10-CM

## 2019-08-19 DIAGNOSIS — I130 Hypertensive heart and chronic kidney disease with heart failure and stage 1 through stage 4 chronic kidney disease, or unspecified chronic kidney disease: Secondary | ICD-10-CM

## 2019-08-19 NOTE — Chronic Care Management (AMB) (Signed)
Chronic Care Management Pharmacy  Name: [name] [name]   MRN: [id number]   DOB: 1946/02/01  Chief Complaint/ HPI  [name] [name],  74 y.o. , male presents for their Initial CCM visit with the clinical pharmacist via telephone due to COVID-19 Pandemic.  PCP : [place], [name] G, MD  Their chronic conditions include: HTN, HLD, DM, HF, CAD, ventricular tachycardia, neurogenic bladder  Office Visits: 06/24/2019- [name] [place], MD - Patient presented for office visit for follow up on recent ER visit. Stitches removed. A1c: 12.6 (06/24/2019). Lantus dose was increased from 30 units to 35 units. Humalog was added: 5-10 units before meals three times a day.   Consult Visit: 08/18/2019- wound care- Baltazar Najjar, MD- Patient presented for debridement of wounds.   06/02/2019- Urology- Modena Slater III- Patient presented for office visit. (notes not available).  03/18/2019- Cardiology- Arvilla Meres, MD- Patient presented for office visit for follow up. Patient presented to be stable. Patient to continue current regimen.   Medications: Outpatient Encounter Medications as of 08/19/2019  Medication Sig  . Alcohol Swabs (B-D SINGLE USE SWABS REGULAR) PADS USE TO TEST BLOOD SUGAR DAILY  . amiodarone (PACERONE) 200 MG tablet TAKE 1 TABLET EVERY DAY  . Ascorbic Acid (VITAMIN C PO) Take 1 tablet by mouth daily. 1000mg   . aspirin EC 81 MG tablet Take 81 mg by mouth daily.  . Blood Glucose Calibration (TRUE METRIX LEVEL 1) Low SOLN USE TO TEST BLOOD SUGAR DAILY  . Blood Glucose Monitoring Suppl (TRUE METRIX AIR GLUCOSE METER) w/Device KIT 1 Device by Does not apply route daily.  . Blood Pressure Monitoring (BLOOD PRESSURE MONITOR AUTOMAT) DEVI To check BP daily  . carvedilol (COREG) 3.125 MG tablet Take 1 tablet (3.125 mg total) by mouth 2 (two) times daily with a meal.  . clopidogrel (PLAVIX) 75 MG tablet TAKE 1 TABLET EVERY DAY  . finasteride (PROSCAR) 5 MG tablet TAKE 1 TABLET EVERY DAY  .  HYDROcodone-acetaminophen (NORCO/VICODIN) 5-325 MG tablet Take 1 tablet by mouth every 6 (six) hours as needed for moderate pain.  Marland Kitchen insulin aspart (NOVOLOG) 100 UNIT/ML injection Inject 10 Units into the skin 3 (three) times daily with meals. You can add extra insulin for sugars over 150 to the mealtime dose based on the sugar before the meal: Add 1 unit of NovoLog if your sugar is 150-200 Add 3 units of NovoLog if your sugar is 201-250 Add 5 units of NovoLog if your sugar is 251-300 Add 7 units of NovoLog if your sugar is over 301  . insulin glargine (LANTUS SOLOSTAR) 100 UNIT/ML Solostar Pen Inject 35 Units into the skin at bedtime.  Marland Kitchen losartan (COZAAR) 25 MG tablet TAKE 1/2 TABLET TWICE DAILY  . MAGNESIUM PO Take 400 mg by mouth every other day.   . mirtazapine (REMERON) 15 MG tablet Take 1 tablet (15 mg total) by mouth at bedtime.  . multivitamin-iron-minerals-folic acid (THERAPEUTIC-M) TABS tablet Take 1 tablet by mouth daily. Gummies- one a day MEN  . nystatin (MYCOSTATIN/NYSTOP) powder Apply topically 3 (three) times daily. (Patient taking differently: Apply topically as needed. )  . rosuvastatin (CRESTOR) 40 MG tablet TAKE 1 TABLET EVERY DAY  . tamsulosin (FLOMAX) 0.4 MG CAPS capsule TAKE 1 CAPSULE EVERY DAY  . torsemide (DEMADEX) 20 MG tablet TAKE 1 TABLET EVERY OTHER DAY  . TRUE METRIX BLOOD GLUCOSE TEST test strip USE  AS  INSTRUCTED  . TRUEplus Lancets 30G MISC USE  TO  TEST BLOOD SUGAR EVERY DAY  .  zinc sulfate 220 (50 Zn) MG capsule Take 1,000 mg by mouth daily.   . clotrimazole-betamethasone (LOTRISONE) cream Apply 1 application topically daily as needed. mall amount. (Patient not taking: Reported on 08/19/2019)  . Probiotic Product (PROBIOTIC PO) Take 1 capsule by mouth daily.  Marland Kitchen SANTYL ointment   . vitamin A 54098 UNIT capsule Take 10,000 Units by mouth daily.   No facility-administered encounter medications on file as of 08/19/2019.     Current Diagnosis/Assessment:  Goals  Addressed            This Visit's Progress   . Pharmacy Care Plan       CARE PLAN ENTRY  Current Barriers:  . Chronic Disease Management support, education, and care coordination needs related to Hypertension, Hyperlipidemia, Diabetes, Heart Failure, Coronary Artery Disease, and Ventricular tachycardia, Neurogenic bladder   Hypertension . Pharmacist Clinical Goal(s): o Over the next 180 days, patient will work with PharmD and providers to maintain BP goal < 130/80 . Current regimen:   Carvedilol (Coreg) 3.125, 1 tablet twice daily with a meal  Losartan 25mg , 0.5 (one-half) tablet twice daily . Patient self care activities - Over the next 180 days, patient will: o Check BP daily, document, and provide at future appointments o Ensure daily salt intake < 1500 mg/day  Hyperlipidemia/ coronary artery disease (s/p bypass graft, ischemic cardiomyopathy, NSTEMI) . Pharmacist Clinical Goal(s): o Over the next 180 days, patient will work with PharmD and providers to maintain LDL goal < 70 . Current regimen:   Rosuvastatin 40mg , 1 tablet once daily   Aspirin 81mg ,1 tablet once daily  Clopidogrel 75mg , 1 tablet once daily . Patient self care activities - Over the next  days, patient will: o Continue current therapy.   Diabetes . Pharmacist Clinical Goal(s): o Over the next 180 days, patient will work with PharmD and providers to achieve A1c goal < 8 . Current regimen:   Insulin aspart (Novolog), inject 10 units three times daily with meals (add 1 unit for sugar: 150-200, add 3 units for sugar: 201-250, add 5 units for sugars: 251-300, add 7 units for sugars..   Insulin glargine (Lantus Solostar), inject 35 units at bedtime  . Interventions: o We discussed: how to recognize and treat signs of hypoglycemia  o Recommend to change Novolog vial to Novolog pen for ease of use.  . Patient self care activities - Over the next 180 days, patient will: o Check blood sugar three times a day,  document, and provide at future appointments o Contact provider with any episodes of hypoglycemia    Heart failure . Pharmacist Clinical Goal(s) o Over the next 180 days, patient will work with PharmD and providers to maintain stable fluid status . Current regimen:   Carvedilol (Coreg) 3.125, 1 tablet twice daily with a meal  Losartan 25mg , 0.5 (one-half) tablet twice daily  Torsemide 20mg , 1 tablet every other day . Patient self care activities o Patient will current current medications and continue follow upw with Dr. Gala Romney.   Ventricular tachycardia . Current regimen:  o Amiodarone 200mg , 1 tablet once daily . Patient self care activities o Patient will continue current medications and follow up with Dr. Gala Romney.  Neurogenic bladder . Current regimen:   Tamsulosin 0.4mg , 1 capsule once daily  Finasteride 5mg , 1 tablet once daily . Interventions: o Will be contacting Dr. Mena Goes at [facility] and assess ability to coordinate nurse to do catheter changes at patient's home.   Medication management . Pharmacist Clinical  Goal(s): o Over the next 180 days, patient will work with PharmD and providers to maintain optimal medication adherence . Current pharmacy: [facility] . Interventions o Comprehensive medication review performed. o Continue current medication management strategy . Patient self care activities - Over the next 180 days, patient will: o Take medications as prescribed o Report any questions or concerns to PharmD and/or provider(s)  Initial goal documentation       SDOH Interventions     Most Recent Value  SDOH Interventions  SDOH Interventions for the Following Domains  Transportation, Financial Strain  Financial Strain Interventions  Other (Comment) [Not needed]  Transportation Interventions  Other (Comment) [Not needed]       Diabetes   Recent Relevant Labs: Lab Results  Component Value Date/Time   HGBA1C 12.6 (A)  06/24/2019 04:20 PM   HGBA1C 8.0 (A) 02/11/2018 09:29 AM   HGBA1C 7.7 (H) 09/10/2017 11:57 AM   HGBA1C 8.7 (H) 04/09/2017 12:44 PM   MICROALBUR 2.9 (H) 09/10/2017 11:57 AM   MICROALBUR 9.3 (H) 04/09/2017 12:44 PM    Checking BG: 3x per Day  Recent BG Readings:  120-160 130-138  Patient notes big difference in BGs; all have been <150   Patient has failed these meds in past: none   Patient is currently controlled on the following medications:   Insulin aspart (Novolog), inject 10 units three times daily with meals (add 1 unit for sugar: 150-200, add 3 units for sugar: 201-250, add 5 units for sugars: 251-300, add 7 units for sugars..   Insulin glargine (Lantus Solostar), inject 35 units at bedtime   Last diabetic Eye exam: No results found for: HMDIABEYEEXA  - needs to set up appts.   Last diabetic Foot exam: No results found for: HMDIABFOOTEX   We discussed: how to recognize and treat signs of hypoglycemia  - endorses os episode when BGs: 60-70; ambulance was called and glucagon was uses.  - aware of how to treat hypoglycemia: OJ   Plan Requested Novolog pen. Continue current medications    Heart Failure  denies no current swelling. No sob at rest; only with activity.    Type: Combined Systolic and Diastolic  NYHA Class: III (marked limitation of activity) AHA HF Stage: C (Heart disease and symptoms present)  Patient is currently controlled on the following medications:   Carvedilol (Coreg) 3.125, 1 tablet twice daily with a meal  Losartan 25mg , 0.5 (one-half) tablet twice daily  Torsemide 20mg , 1 tablet every other day  We discussed weighing daily; if you gain more than 3 pounds in one day or 5 pounds in one week call your doctor  - patient unable to stand; patient is unmobile.   Plan Managed by DR. Daniel Bensimhon Continue current medications  Hypertension  Denies dizziness.   BP today is:  <130/80  Office blood pressures are  BP Readings from Last 3  Encounters:  06/24/19 124/70  06/16/19 117/82  03/18/19 110/80    Patient home SBP readings are ranging: 110   Patient is controlled on:   Carvedilol (Coreg) 3.125, 1 tablet twice daily with a meal  Losartan 25mg , 0.5 (one-half) tablet twice daily  Plan Managed by Dr. Arvilla Meres Continue current medications   Hyperlipidemia/ ASCVD (CAD s/p bypass graft, ischemic cardiomyopathy, NSTEMI)   Lipid Panel     Component Value Date/Time   CHOL 120 03/09/2014 0940   TRIG 123 03/09/2014 0940   HDL 39 (L) 03/09/2014 0940   CHOLHDL 3.1 03/09/2014 0940  VLDL 25 03/09/2014 0940   LDLCALC 56 03/09/2014 0940   LDLDIRECT 100.5 02/08/2012 0903    The ASCVD Risk score Denman George DC Jr., et al., 2013) failed to calculate for the following reasons:   The patient has a prior MI or stroke diagnosis    Patient is currently controlled on the following medications:   Rosuvastatin 40mg , 1 tablet once daily   Aspirin 81mg ,1 tablet once daily  Clopidogrel 75mg , 1 tablet once daily  Plan Managed by Dr. Arvilla Meres Continue current medications  Ventricular tachycardia  Patient has failed these meds in past: none  Patient is currently controlled on the following medications:   Amiodarone 200mg , 1 tablet once daily  Plan  Recommended obtain eye doc appt for monitoring Continue current medications  Neurogenic bladder  Patient has failed these meds in past: none  Patient is currently controlled on the following medications:   Tamsulosin 0.4mg , 1 capsule once daily  Finasteride 5mg , 1 tablet once daily  Patient has permanent cath and gets changed monthly  Plan Continue current medications  Requested nurse to go to home and change catheter; patient used to have this service. Will follow up with [facility]- Jerilee Field  Unclear diagnosis  Patient has failed these meds in past: none  Patient is currently on the following medications:   Mirtazapine 15mg , 1 tablet  at bedtime (rest)   Plan Continue current medications   OTC/ supplements  Patient is currently on the following:  Ascorbic acid 1000mg , 1 tablet twice daily  Clortrimazole- betamethasone (Lotrisone) cream, apply once daily as needed  Magnesium 400mg , 1 tablet every other day  Multivitamin- iron-minerals-folic acid, 1 tablet once daily  Nystatin powder, apply three times daiiy  Can use corn starch.   Probiotic, 1 capsule once daily  Vitamin A  (10,000 units, 1 capsule once daily  Zinc sulfate 1000mg , 1 capsule once daily   Medication Management  Patient organizes medications: pill box organized by granddaughter.  Barriers: no issues.  Primary pharmacy: [facility] order Adherence: - tamsulosin 0.4mg  (04/07/2019, 90DS)   Follow up Follow up visit with PharmD in 6 months. Will conduct general telephone calls for periodic check-ins before next visit.   - Will request Novolog Pen (instead of vial for ease of administration) - Requested nurse to go to home and change catheter; patient used to have this service. Will follow up with [facility]- Jerilee Field - Requested refill for Hydrocodone/APAP (07/14/16- last filled)  patient to schedule visit with Dr. [place] for refill.    Laural Benes, PharmD Clinical Pharmacist [facility] [place] [phone]

## 2019-08-21 NOTE — Patient Instructions (Addendum)
Visit Information  Goals Addressed            This Visit's Progress   . Pharmacy Care Plan       CARE PLAN ENTRY  Current Barriers:  . Chronic Disease Management support, education, and care coordination needs related to Hypertension, Hyperlipidemia, Diabetes, Heart Failure, Coronary Artery Disease, and Ventricular tachycardia, Neurogenic bladder   Hypertension . Pharmacist Clinical Goal(s): o Over the next 180 days, patient will work with PharmD and providers to maintain BP goal < 130/80 . Current regimen:   Carvedilol (Coreg) 3.125, 1 tablet twice daily with a meal  Losartan '25mg'$ , 0.5 (one-half) tablet twice daily . Patient self care activities - Over the next 180 days, patient will: o Check BP daily, document, and provide at future appointments o Ensure daily salt intake < 1500 mg/day  Hyperlipidemia/ coronary artery disease (s/p bypass graft, ischemic cardiomyopathy, NSTEMI) . Pharmacist Clinical Goal(s): o Over the next 180 days, patient will work with PharmD and providers to maintain LDL goal < 70 . Current regimen:   Rosuvastatin '40mg'$ , 1 tablet once daily   Aspirin '81mg'$ ,1 tablet once daily  Clopidogrel '75mg'$ , 1 tablet once daily . Patient self care activities - Over the next  days, patient will: o Continue current therapy.   Diabetes . Pharmacist Clinical Goal(s): o Over the next 180 days, patient will work with PharmD and providers to achieve A1c goal < 8 . Current regimen:   Insulin aspart (Novolog), inject 10 units three times daily with meals (add 1 unit for sugar: 150-200, add 3 units for sugar: 201-250, add 5 units for sugars: 251-300, add 7 units for sugars..   Insulin glargine (Lantus Solostar), inject 35 units at bedtime  . Interventions: o We discussed: how to recognize and treat signs of hypoglycemia  o Recommend to change Novolog vial to Novolog pen for ease of use.  . Patient self care activities - Over the next 180 days, patient will: o Check  blood sugar three times a day, document, and provide at future appointments o Contact provider with any episodes of hypoglycemia    Heart failure . Pharmacist Clinical Goal(s) o Over the next 180 days, patient will work with PharmD and providers to maintain stable fluid status . Current regimen:   Carvedilol (Coreg) 3.125, 1 tablet twice daily with a meal  Losartan '25mg'$ , 0.5 (one-half) tablet twice daily  Torsemide '20mg'$ , 1 tablet every other day . Patient self care activities o Patient will current current medications and continue follow upw with Dr. Haroldine Laws.   Ventricular tachycardia . Current regimen:  o Amiodarone '200mg'$ , 1 tablet once daily . Patient self care activities o Patient will continue current medications and follow up with Dr. Haroldine Laws.  Neurogenic bladder . Current regimen:   Tamsulosin 0.'4mg'$ , 1 capsule once daily  Finasteride '5mg'$ , 1 tablet once daily . Interventions: o Will be contacting Dr. Junious Silk at [facility] and assess ability to coordinate nurse to do catheter changes at patient's home.   Medication management . Pharmacist Clinical Goal(s): o Over the next 180 days, patient will work with PharmD and providers to maintain optimal medication adherence . Current pharmacy: [facility] . Interventions o Comprehensive medication review performed. o Continue current medication management strategy . Patient self care activities - Over the next 180 days, patient will: o Take medications as prescribed o Report any questions or concerns to PharmD and/or provider(s)  Initial goal documentation        [name] [name] was  given information about Chronic Care Management services today including:  1. CCM service includes personalized support from designated clinical staff supervised by his physician, including individualized plan of care and coordination with other care providers 2. 24/7 contact phone numbers for assistance for urgent and routine  care needs. 3. Standard insurance, coinsurance, copays and deductibles apply for chronic care management only during months in which we provide at least 20 minutes of these services. Most insurances cover these services at 100%, however patients may be responsible for any copay, coinsurance and/or deductible if applicable. This service may help you avoid the need for more expensive face-to-face services. 4. Only one practitioner may furnish and bill the service in a calendar month. 5. The patient may stop CCM services at any time (effective at the end of the month) by phone call to the office staff.  Patient agreed to services and verbal consent obtained.   The patient verbalized understanding of instructions provided today and agreed to receive a mailed copy of patient instruction and/or educational materials. Telephone follow up appointment with pharmacy team member scheduled for: 02/16/2020  Anson Crofts, PharmD Clinical Pharmacist [facility] [place] [phone]   Hypoglycemia Hypoglycemia occurs when the level of sugar (glucose) in the blood is too low. Hypoglycemia can happen in people who do or do not have diabetes. It can develop quickly, and it can be a medical emergency. For most people with diabetes, a blood glucose level below 70 mg/dL (3.9 mmol/L) is considered hypoglycemia. Glucose is a type of sugar that provides the body's main source of energy. Certain hormones (insulin and glucagon) control the level of glucose in the blood. Insulin lowers blood glucose, and glucagon raises blood glucose. Hypoglycemia can result from having too much insulin in the bloodstream, or from not eating enough food that contains glucose. You may also have reactive hypoglycemia, which happens within 4 hours after eating a meal. What are the causes? Hypoglycemia occurs most often in people who have diabetes and may be caused by:  Diabetes medicine.  Not eating enough, or not  eating often enough.  Increased physical activity.  Drinking alcohol on an empty stomach. If you do not have diabetes, hypoglycemia may be caused by:  A tumor in the pancreas.  Not eating enough, or not eating for long periods at a time (fasting).  A severe infection or illness.  Certain medicines. What increases the risk? Hypoglycemia is more likely to develop in:  People who have diabetes and take medicines to lower blood glucose.  People who abuse alcohol.  People who have a severe illness. What are the signs or symptoms? Mild symptoms Mild hypoglycemia may not cause any symptoms. If you do have symptoms, they may include:  Hunger.  Anxiety.  Sweating and feeling clammy.  Dizziness or feeling light-headed.  Sleepiness.  Nausea.  Increased heart rate.  Headache.  Blurry vision.  Irritability.  Tingling or numbness around the mouth, lips, or tongue.  A change in coordination.  Restless sleep. Moderate symptoms Moderate hypoglycemia can cause:  Mental confusion and poor judgment.  Behavior changes.  Weakness.  Irregular heartbeat. Severe symptoms Severe hypoglycemia is a medical emergency. It can cause:  Fainting.  Seizures.  Loss of consciousness (coma).  Death. How is this diagnosed? Hypoglycemia is diagnosed with a blood test to measure your blood glucose level. This blood test is done while you are having symptoms. Your health care provider may also do a physical exam and review your  medical history. How is this treated? This condition can often be treated by immediately eating or drinking something that contains sugar, such as:  Fruit juice, 4-6 oz (120-150 mL).  Regular soda (not diet soda), 4-6 oz (120-150 mL).  Low-fat milk, 4 oz (120 mL).  Several pieces of hard candy.  Sugar or honey, 1 Tbsp (15 mL). Treating hypoglycemia if you have diabetes If you are alert and able to swallow safely, follow the 15:15 rule:  Take 15  grams of a rapid-acting carbohydrate. Talk with your health care provider about how much you should take.  Rapid-acting options include: ? Glucose pills (take 15 grams). ? 6-8 pieces of hard candy. ? 4-6 oz (120-150 mL) of fruit juice. ? 4-6 oz (120-150 mL) of regular (not diet) soda. ? 1 Tbsp (15 mL) honey or sugar.  Check your blood glucose 15 minutes after you take the carbohydrate.  If the repeat blood glucose level is still at or below 70 mg/dL (3.9 mmol/L), take 15 grams of a carbohydrate again.  If your blood glucose level does not increase above 70 mg/dL (3.9 mmol/L) after 3 tries, seek emergency medical care.  After your blood glucose level returns to normal, eat a meal or a snack within 1 hour.  Treating severe hypoglycemia Severe hypoglycemia is when your blood glucose level is at or below 54 mg/dL (3 mmol/L). Severe hypoglycemia is a medical emergency. Get medical help right away. If you have severe hypoglycemia and you cannot eat or drink, you may need an injection of glucagon. A family member or close friend should learn how to check your blood glucose and how to give you a glucagon injection. Ask your health care provider if you need to have an emergency glucagon injection kit available. Severe hypoglycemia may need to be treated in a hospital. The treatment may include getting glucose through an IV. You may also need treatment for the cause of your hypoglycemia. Follow these instructions at home:  General instructions  Take over-the-counter and prescription medicines only as told by your health care provider.  Monitor your blood glucose as told by your health care provider.  Limit alcohol intake to no more than 1 drink a day for nonpregnant women and 2 drinks a day for men. One drink equals 12 oz of beer (355 mL), 5 oz of wine (148 mL), or 1 oz of hard liquor (44 mL).  Keep all follow-up visits as told by your health care provider. This is important. If you have  diabetes:  Always have a rapid-acting carbohydrate snack with you to treat low blood glucose.  Follow your diabetes management plan as directed. Make sure you: ? Know the symptoms of hypoglycemia. It is important to treat it right away to prevent it from becoming severe. ? Take your medicines as directed. ? Follow your exercise plan. ? Follow your meal plan. Eat on time, and do not skip meals. ? Check your blood glucose as often as directed. Always check before and after exercise. ? Follow your sick day plan whenever you cannot eat or drink normally. Make this plan in advance with your health care provider.  Share your diabetes management plan with people in your workplace, school, and household.  Check your urine for ketones when you are ill and as told by your health care provider.  Carry a medical alert card or wear medical alert jewelry. Contact a health care provider if:  You have problems keeping your blood glucose in your target  range.  You have frequent episodes of hypoglycemia. Get help right away if:  You continue to have hypoglycemia symptoms after eating or drinking something containing glucose.  Your blood glucose is at or below 54 mg/dL (3 mmol/L).  You have a seizure.  You faint. These symptoms may represent a serious problem that is an emergency. Do not wait to see if the symptoms will go away. Get medical help right away. Call your local emergency services (911 in the [place] Summary  Hypoglycemia occurs when the level of sugar (glucose) in the blood is too low.  Hypoglycemia can happen in people who do or do not have diabetes. It can develop quickly, and it can be a medical emergency.  Make sure you know the symptoms of hypoglycemia and how to treat it.  Always have a rapid-acting carbohydrate snack with you to treat low blood sugar. This information is not intended to replace advice given to you by your health care provider. Make sure you discuss any  questions you have with your health care provider. Document Revised: 09/30/2017 Document Reviewed: 05/13/2015 Elsevier Patient Education  2020 [employer].

## 2019-08-25 DIAGNOSIS — N312 Flaccid neuropathic bladder, not elsewhere classified: Secondary | ICD-10-CM | POA: Diagnosis not present

## 2019-08-26 ENCOUNTER — Ambulatory Visit: Payer: Self-pay

## 2019-08-26 NOTE — Chronic Care Management (AMB) (Signed)
Left message with Dr. Rodman Key Eskridge's nurse [facility]) requesting a call back.   At last CCM visit, daughter requested if a nurse can go to patient's home for the monthly catheter change instead of having to go to the office every month. Patient is immobile, and monthly visits to provider's office require time and moving patient to car can be difficult at times.

## 2019-09-01 ENCOUNTER — Other Ambulatory Visit: Payer: Self-pay

## 2019-09-01 ENCOUNTER — Encounter (HOSPITAL_BASED_OUTPATIENT_CLINIC_OR_DEPARTMENT_OTHER): Payer: Medicare HMO | Attending: Internal Medicine | Admitting: Internal Medicine

## 2019-09-01 DIAGNOSIS — L97221 Non-pressure chronic ulcer of left calf limited to breakdown of skin: Secondary | ICD-10-CM | POA: Insufficient documentation

## 2019-09-01 DIAGNOSIS — I5040 Unspecified combined systolic (congestive) and diastolic (congestive) heart failure: Secondary | ICD-10-CM | POA: Diagnosis not present

## 2019-09-01 DIAGNOSIS — L89620 Pressure ulcer of left heel, unstageable: Secondary | ICD-10-CM | POA: Insufficient documentation

## 2019-09-01 DIAGNOSIS — I130 Hypertensive heart and chronic kidney disease with heart failure and stage 1 through stage 4 chronic kidney disease, or unspecified chronic kidney disease: Secondary | ICD-10-CM | POA: Insufficient documentation

## 2019-09-01 DIAGNOSIS — L89613 Pressure ulcer of right heel, stage 3: Secondary | ICD-10-CM | POA: Diagnosis not present

## 2019-09-01 DIAGNOSIS — L97211 Non-pressure chronic ulcer of right calf limited to breakdown of skin: Secondary | ICD-10-CM | POA: Diagnosis not present

## 2019-09-01 DIAGNOSIS — E11621 Type 2 diabetes mellitus with foot ulcer: Secondary | ICD-10-CM | POA: Insufficient documentation

## 2019-09-01 DIAGNOSIS — E1122 Type 2 diabetes mellitus with diabetic chronic kidney disease: Secondary | ICD-10-CM | POA: Diagnosis not present

## 2019-09-01 DIAGNOSIS — S80211D Abrasion, right knee, subsequent encounter: Secondary | ICD-10-CM | POA: Insufficient documentation

## 2019-09-01 DIAGNOSIS — E11622 Type 2 diabetes mellitus with other skin ulcer: Secondary | ICD-10-CM | POA: Diagnosis not present

## 2019-09-01 DIAGNOSIS — L97522 Non-pressure chronic ulcer of other part of left foot with fat layer exposed: Secondary | ICD-10-CM | POA: Diagnosis not present

## 2019-09-01 DIAGNOSIS — L97412 Non-pressure chronic ulcer of right heel and midfoot with fat layer exposed: Secondary | ICD-10-CM | POA: Diagnosis not present

## 2019-09-01 DIAGNOSIS — E1140 Type 2 diabetes mellitus with diabetic neuropathy, unspecified: Secondary | ICD-10-CM | POA: Diagnosis not present

## 2019-09-01 DIAGNOSIS — L97822 Non-pressure chronic ulcer of other part of left lower leg with fat layer exposed: Secondary | ICD-10-CM | POA: Diagnosis not present

## 2019-09-01 DIAGNOSIS — X58XXXA Exposure to other specified factors, initial encounter: Secondary | ICD-10-CM | POA: Insufficient documentation

## 2019-09-01 DIAGNOSIS — L97521 Non-pressure chronic ulcer of other part of left foot limited to breakdown of skin: Secondary | ICD-10-CM | POA: Insufficient documentation

## 2019-09-01 DIAGNOSIS — L97321 Non-pressure chronic ulcer of left ankle limited to breakdown of skin: Secondary | ICD-10-CM | POA: Insufficient documentation

## 2019-09-01 DIAGNOSIS — N183 Chronic kidney disease, stage 3 unspecified: Secondary | ICD-10-CM | POA: Insufficient documentation

## 2019-09-01 NOTE — Progress Notes (Signed)
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  10:15:19 -------------------------------------------------------------------------------- Physical Exam Details Patient Name: Date of Service: [name] Foley F. 09/01/2019 9:30 A M Medical Record Number: [id number] Patient Account Number: [id number] Date of Birth/Sex: Treating RN: [birth year] (74 y.o. [name] [name] Primary Care Provider: [name], [name] Other Clinician: Referring Provider: Treating Provider/Extender: Roosevelt Eimers [name], [name] Weeks in Treatment: 176 Constitutional Sitting or standing Blood Pressure is within target range for patient.. Pulse regular and within target range for patient.Marland Kitchen Respirations regular, non-labored and within target range.. Temperature is normal and within the target range for the patient.Marland Kitchen Appears in no distress. Notes Wound exam On the left, the left lateral lower leg parallel to the tibia debrided with an open curette of very adherent necrotic debris Left dorsal foot just distal to the crease of the ankle has 1 small open area that requires debridement. Left heel also requires debridement The right heel and right lateral lower leg both look better with the Santyl. The right heel required debridement with an open curette hemostasis with direct pressure The area on the right patella is smaller Electronic Signature(s) Signed: 09/01/2019 6:13:26 PM By: Linton Ham MD Entered By: Linton Ham on 09/01/2019 10:18:11 -------------------------------------------------------------------------------- Physician Orders Details Patient Name: Date of Service: [name] Foley F. 09/01/2019 9:30 A M Medical Record Number: [id number] Patient Account Number: [id number] Date of Birth/Sex: Treating RN: 01-23-1946 (74 y.o. 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 Polymen to everything else all of the wounds look somewhat better 3. He does not have an arterial issue or obvious infection Electronic Signature(s) Signed: 09/01/2019 6:13:26 PM By: Linton Ham MD Entered By: Linton Ham on 09/01/2019 10:19:45 -------------------------------------------------------------------------------- SuperBill Details Patient Name: Date of Service: [name] Foley F. 09/01/2019 Medical Record Number: [id number] Patient Account Number: [id number] Date of Birth/Sex: Treating RN: 01-Aug-1945 (74 y.o. 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 L97.521 Non-pressure chronic ulcer of other part of left foot limited to breakdown of skin L89.620 Pressure ulcer of left heel, unstageable L97.211 Non-pressure chronic ulcer of right calf limited to breakdown of skin S80.211D Abrasion, right knee, subsequent encounter Facility Procedures CPT4 Code: [id number] Description: [facility] - DEB SUBQ TISSUE 20 SQ CM/< ICD-10 Diagnosis Description L89.613 Pressure ulcer of right heel, stage 3 L97.221 Non-pressure chronic ulcer of left calf limited to breakdown of skin Modifier: Quantity: 1 Physician Procedures : CPT4 Code Description Modifier [id number] - WC PHYS SUBQ TISS 20 SQ CM ICD-10 Diagnosis Description L89.613 Pressure ulcer of right heel, stage 3 L97.221 Non-pressure chronic ulcer of left calf limited to breakdown of skin Quantity: 1 Electronic Signature(s) Signed: 09/01/2019 6:13:26 PM By: Linton Ham MD Entered By: Linton Ham on 09/01/2019 10:21:00  an area that I did not pick up on last time on the tip of the left calcaneus As well he has a new area on the right lateral calf which was new today. 4/13; this is a patient with multiple medical problems. He has been in this clinic for a very prolonged period of time with different wounds in different areas in his lower extremities. Currently we have the left lateral tibia, left heel, left dorsal foot, right lateral heel and right lateral lower tibia. We have been using Santyl on the right and silver alginate on the left He has his daughter who changes his dressings. The reason behind the nonhealing here and ulcer recurrence is really never been clear to me. The patient is debilitated. He uses bunny boots apparently at night to protect his heels and feet. His arterial studies have previously not suggested a primary arterial etiology 4/27 2-week follow-up. He has a new wound on the right anterior patella currently trauma against the wheelchair. Failing this had he has areas on the right lateral calf and right lateral calcaneus both of which we have been using Santyl. On the left side one of the original wounds on the left lateral tibia area, left calcaneus, punched out areas on the left dorsal foot. On the left we have been using PolyMem Ag 5/11; 2-week follow-up. He does not have any new wounds this week and most of his wounds look better. Using Santyl on the right lateral heel and right lateral ankle and polymen Ag and everything else under compression. Electronic Signature(s) Signed: 09/01/2019 6:13:26 PM By: Linton Ham MD Entered By: Linton Ham on 09/01/2019  10:15:19 -------------------------------------------------------------------------------- Physical Exam Details Patient Name: Date of Service: [name] Foley F. 09/01/2019 9:30 A M Medical Record Number: [id number] Patient Account Number: [id number] Date of Birth/Sex: Treating RN: [birth year] (74 y.o. [name] [name] Primary Care Provider: [name], [name] Other Clinician: Referring Provider: Treating Provider/Extender: Roosevelt Eimers [name], [name] Weeks in Treatment: 176 Constitutional Sitting or standing Blood Pressure is within target range for patient.. Pulse regular and within target range for patient.Marland Kitchen Respirations regular, non-labored and within target range.. Temperature is normal and within the target range for the patient.Marland Kitchen Appears in no distress. Notes Wound exam On the left, the left lateral lower leg parallel to the tibia debrided with an open curette of very adherent necrotic debris Left dorsal foot just distal to the crease of the ankle has 1 small open area that requires debridement. Left heel also requires debridement The right heel and right lateral lower leg both look better with the Santyl. The right heel required debridement with an open curette hemostasis with direct pressure The area on the right patella is smaller Electronic Signature(s) Signed: 09/01/2019 6:13:26 PM By: Linton Ham MD Entered By: Linton Ham on 09/01/2019 10:18:11 -------------------------------------------------------------------------------- Physician Orders Details Patient Name: Date of Service: [name] Foley F. 09/01/2019 9:30 A M Medical Record Number: [id number] Patient Account Number: [id number] Date of Birth/Sex: Treating RN: 01-23-1946 (74 y.o. 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

## 2019-09-02 DIAGNOSIS — E11621 Type 2 diabetes mellitus with foot ulcer: Secondary | ICD-10-CM | POA: Diagnosis not present

## 2019-09-02 NOTE — Progress Notes (Signed)
Wound Assessment Details Patient Name: Date of Service: [name] [name] [name] [name] F. 09/01/2019 9:30 A M Medical Record Number: [id number] Patient Account Number: [id number] Date of Birth/Sex: Treating RN: 12/10/1945 (74 y.o. [name] [name]) [name] Primary Care Venna Berberich: [name], [name] Other Clinician: Referring Adyan Palau: Treating Derius Ghosh/Extender: [name], [name] [name], [name] Weeks in Treatment: 176 Wound Status Wound Number: 27 Primary Diabetic Wound/Ulcer of the Lower Extremity Etiology: Wound Location: Right, Lateral Knee Wound Open Wounding Event: Shear/Friction Status: Date Acquired: 08/18/2019 Comorbid Anemia, Sleep Apnea, Arrhythmia, Congestive Heart Failure, Weeks Of Treatment: 2 History: Coronary Artery Disease, Hypertension, Myocardial Infarction, Clustered Wound: No Type II Diabetes, Gout, Neuropathy Wound Measurements Length: (cm) 1.2 Width: (cm) 0.8 Depth: (cm) 0.1 Area: (cm) 0.754 Volume: (cm) 0.075 % Reduction in Area: 50.8% % Reduction in Volume: 51% Epithelialization: Small (1-33%) Tunneling: No Undermining: No Wound Description Classification: Grade 2 Wound Margin: Distinct, outline attached Exudate Amount: Small Exudate Type: Serosanguineous Exudate Color: red, brown Foul Odor After Cleansing:  No Slough/Fibrino No Wound Bed Granulation Amount: Large (67-100%) Exposed Structure Granulation Quality: Red, Pink Fascia Exposed: No Necrotic Amount: None Present (0%) Fat Layer (Subcutaneous Tissue) Exposed: Yes Tendon Exposed: No Muscle Exposed: No Joint Exposed: No Bone Exposed: No Treatment Notes Wound #27 (Right, Lateral Knee) 1. Cleanse With Wound Cleanser Soap and water 2. Periwound Care Skin Prep 3. Primary Dressing Applied Polymem Ag 4. Secondary Dressing Foam Border Dressing Notes polymem ag to knee and santyl to other sites, cotton/coban Electronic Signature(s) Signed: 09/01/2019 5:53:12 PM By: [name] RN Entered By: [name] on 09/01/2019 09:48:27 -------------------------------------------------------------------------------- Vitals Details Patient Name: Date of Service: [name] [name] F. 09/01/2019 9:30 A M Medical Record Number: [id number] Patient Account Number: [id number] Date of Birth/Sex: Treating RN: 08-20-1945 (74 y.o. [name] [name]) [name] Primary Care Elzada Pytel: [name], [name] Other Clinician: Referring Coline Calkin: Treating Gavin Faivre/Extender: [name], [name] [name], [name] Weeks in Treatment: 176 Vital Signs Time Taken: 09:37 Temperature (F): 97.9 Height (in): 74 Pulse (bpm): 77 Weight (lbs): 150 Respiratory Rate (breaths/min): 19 Body Mass Index (BMI): 19.3 Blood Pressure (mmHg): 109/61 Reference Range: 80 - 120 mg / dl Electronic Signature(s) Signed: 09/02/2019 9:08:01 AM By: Sandre Kitty Entered By: Sandre Kitty on 09/01/2019 09:38:15  No Joint: No Bone: No Bone:  No None None None Epithelialization: N/A N/A N/A Debridement: N/A N/A N/A Pain Control: N/A N/A N/A Tissue Debrided: N/A N/A N/A Level: N/A N/A N/A Debridement A (sq cm): rea N/A N/A N/A Instrument: N/A N/A N/A Bleeding: N/A N/A N/A Hemostasis A chieved: N/A N/A N/A Procedural Pain: N/A N/A N/A Post Procedural Pain: N/A N/A N/A Debridement Treatment Response: N/A N/A N/A Post Debridement Measurements L x W x D (cm) N/A N/A N/A Post Debridement Volume: (cm) N/A N/A N/A Procedures Performed: Wound Number: 27 N/A N/A Photos: No Photos N/A N/A Right, Lateral Knee N/A N/A Wound Location: Shear/Friction N/A N/A Wounding Event: Diabetic Wound/Ulcer of the Lower N/A N/A Primary Etiology: Extremity N/A N/A N/A Secondary Etiology: Anemia, Sleep Apnea, Arrhythmia, N/A N/A Comorbid History: Congestive Heart Failure, Coronary Artery Disease, Hypertension, Myocardial Infarction, Type II Diabetes, Gout, Neuropathy 08/18/2019 N/A N/A Date A cquired: 2 N/A N/A Weeks of Treatment: Open N/A N/A Wound Status: No N/A N/A Clustered Wound: N/A N/A N/A Clustered Quantity: 1.2x0.8x0.1 N/A N/A Measurements L x W x D (cm) 0.754 N/A N/A A (cm) : rea 0.075 N/A N/A Volume (cm) : 50.80% N/A N/A % Reduction in A rea: 51.00% N/A N/A % Reduction in Volume: Grade 2 N/A N/A Classification: Small N/A N/A Exudate A mount: Serosanguineous N/A N/A Exudate Type: red, brown N/A N/A Exudate Color: Distinct, outline attached N/A N/A Wound Margin: Large (67-100%) N/A N/A Granulation A mount: Red, Pink N/A N/A Granulation Quality: None Present (0%) N/A N/A Necrotic A mount: Fat Layer (Subcutaneous Tissue) N/A N/A Exposed Structures: Exposed: Yes Fascia: No Tendon: No Muscle: No Joint: No Bone: No Small (1-33%) N/A N/A Epithelialization: N/A N/A N/A Debridement: N/A N/A N/A Pain Control: N/A N/A N/A Tissue Debrided: N/A N/A N/A Level: N/A N/A  N/A Debridement A (sq cm): rea N/A N/A N/A Instrument: N/A N/A N/A Bleeding: N/A N/A N/A Hemostasis A chieved: N/A N/A N/A Procedural Pain: N/A N/A N/A Post Procedural Pain: Debridement Treatment Response: N/A N/A N/A Post Debridement Measurements L x N/A N/A N/A W x D (cm) N/A N/A N/A Post Debridement Volume: (cm) N/A N/A N/A Procedures Performed: Treatment Notes Electronic Signature(s) Signed: 09/01/2019 5:53:12 PM By: [name] RN Signed: 09/01/2019 6:13:26 PM By: Linton Ham MD Entered By: Linton Ham on 09/01/2019 10:13:34 -------------------------------------------------------------------------------- Multi-Disciplinary Care Plan Details Patient Name: Date of Service: [name] [name] F. 09/01/2019 9:30 A M Medical Record Number: [id number] Patient Account Number: [id number] Date of Birth/Sex: Treating RN: April 27, 1945 (74 y.o. [name] Primary Care Nicko Daher: [name], [name] Other Clinician: Referring Alonna Bartling: Treating Bralynn Velador/Extender: [name], [name] [name], [name] Weeks in Treatment: 176 Active Inactive Wound/Skin Impairment Nursing Diagnoses: Impaired tissue integrity Goals: Patient/caregiver will verbalize understanding of skin care regimen Date Initiated: 11/19/2017 Target Resolution Date: 09/04/2019 Goal Status: Active Ulcer/skin breakdown will have a volume reduction of 30% by week 4 Date Initiated: 11/19/2017 Date Inactivated: 12/02/2017 Target Resolution Date: 12/22/2017 Goal Status: Unmet Unmet Reason: multipl comorbidities Interventions: Assess patient/caregiver ability to obtain necessary supplies Assess ulceration(s) every visit Provide education on ulcer and skin care Notes: Electronic Signature(s) Signed: 09/01/2019 5:53:12 PM By: [name] RN Entered By: [name] on 09/01/2019 09:14:54 -------------------------------------------------------------------------------- Pain Assessment Details Patient Name: Date of  Service: [name] [name] 09/01/2019 9:30 A M Medical Record Number: [id number] Patient Account Number: [id number] Date of Birth/Sex: Treating RN: 09-Sep-1945 (74 y.o. [name] Primary Care Wille Aubuchon: [name], [name] Other Clinician: Referring Favian Kittleson: Treating  Wound Assessment Details Patient Name: Date of Service: [name] [name] [name] [name] F. 09/01/2019 9:30 A M Medical Record Number: [id number] Patient Account Number: [id number] Date of Birth/Sex: Treating RN: 12/10/1945 (74 y.o. [name] [name]) [name] Primary Care Venna Berberich: [name], [name] Other Clinician: Referring Adyan Palau: Treating Derius Ghosh/Extender: [name], [name] [name], [name] Weeks in Treatment: 176 Wound Status Wound Number: 27 Primary Diabetic Wound/Ulcer of the Lower Extremity Etiology: Wound Location: Right, Lateral Knee Wound Open Wounding Event: Shear/Friction Status: Date Acquired: 08/18/2019 Comorbid Anemia, Sleep Apnea, Arrhythmia, Congestive Heart Failure, Weeks Of Treatment: 2 History: Coronary Artery Disease, Hypertension, Myocardial Infarction, Clustered Wound: No Type II Diabetes, Gout, Neuropathy Wound Measurements Length: (cm) 1.2 Width: (cm) 0.8 Depth: (cm) 0.1 Area: (cm) 0.754 Volume: (cm) 0.075 % Reduction in Area: 50.8% % Reduction in Volume: 51% Epithelialization: Small (1-33%) Tunneling: No Undermining: No Wound Description Classification: Grade 2 Wound Margin: Distinct, outline attached Exudate Amount: Small Exudate Type: Serosanguineous Exudate Color: red, brown Foul Odor After Cleansing:  No Slough/Fibrino No Wound Bed Granulation Amount: Large (67-100%) Exposed Structure Granulation Quality: Red, Pink Fascia Exposed: No Necrotic Amount: None Present (0%) Fat Layer (Subcutaneous Tissue) Exposed: Yes Tendon Exposed: No Muscle Exposed: No Joint Exposed: No Bone Exposed: No Treatment Notes Wound #27 (Right, Lateral Knee) 1. Cleanse With Wound Cleanser Soap and water 2. Periwound Care Skin Prep 3. Primary Dressing Applied Polymem Ag 4. Secondary Dressing Foam Border Dressing Notes polymem ag to knee and santyl to other sites, cotton/coban Electronic Signature(s) Signed: 09/01/2019 5:53:12 PM By: [name] RN Entered By: [name] on 09/01/2019 09:48:27 -------------------------------------------------------------------------------- Vitals Details Patient Name: Date of Service: [name] [name] F. 09/01/2019 9:30 A M Medical Record Number: [id number] Patient Account Number: [id number] Date of Birth/Sex: Treating RN: 08-20-1945 (74 y.o. [name] [name]) [name] Primary Care Elzada Pytel: [name], [name] Other Clinician: Referring Coline Calkin: Treating Gavin Faivre/Extender: [name], [name] [name], [name] Weeks in Treatment: 176 Vital Signs Time Taken: 09:37 Temperature (F): 97.9 Height (in): 74 Pulse (bpm): 77 Weight (lbs): 150 Respiratory Rate (breaths/min): 19 Body Mass Index (BMI): 19.3 Blood Pressure (mmHg): 109/61 Reference Range: 80 - 120 mg / dl Electronic Signature(s) Signed: 09/02/2019 9:08:01 AM By: Sandre Kitty Entered By: Sandre Kitty on 09/01/2019 09:38:15  No Joint: No Bone: No Bone:  No None None None Epithelialization: N/A N/A N/A Debridement: N/A N/A N/A Pain Control: N/A N/A N/A Tissue Debrided: N/A N/A N/A Level: N/A N/A N/A Debridement A (sq cm): rea N/A N/A N/A Instrument: N/A N/A N/A Bleeding: N/A N/A N/A Hemostasis A chieved: N/A N/A N/A Procedural Pain: N/A N/A N/A Post Procedural Pain: N/A N/A N/A Debridement Treatment Response: N/A N/A N/A Post Debridement Measurements L x W x D (cm) N/A N/A N/A Post Debridement Volume: (cm) N/A N/A N/A Procedures Performed: Wound Number: 27 N/A N/A Photos: No Photos N/A N/A Right, Lateral Knee N/A N/A Wound Location: Shear/Friction N/A N/A Wounding Event: Diabetic Wound/Ulcer of the Lower N/A N/A Primary Etiology: Extremity N/A N/A N/A Secondary Etiology: Anemia, Sleep Apnea, Arrhythmia, N/A N/A Comorbid History: Congestive Heart Failure, Coronary Artery Disease, Hypertension, Myocardial Infarction, Type II Diabetes, Gout, Neuropathy 08/18/2019 N/A N/A Date A cquired: 2 N/A N/A Weeks of Treatment: Open N/A N/A Wound Status: No N/A N/A Clustered Wound: N/A N/A N/A Clustered Quantity: 1.2x0.8x0.1 N/A N/A Measurements L x W x D (cm) 0.754 N/A N/A A (cm) : rea 0.075 N/A N/A Volume (cm) : 50.80% N/A N/A % Reduction in A rea: 51.00% N/A N/A % Reduction in Volume: Grade 2 N/A N/A Classification: Small N/A N/A Exudate A mount: Serosanguineous N/A N/A Exudate Type: red, brown N/A N/A Exudate Color: Distinct, outline attached N/A N/A Wound Margin: Large (67-100%) N/A N/A Granulation A mount: Red, Pink N/A N/A Granulation Quality: None Present (0%) N/A N/A Necrotic A mount: Fat Layer (Subcutaneous Tissue) N/A N/A Exposed Structures: Exposed: Yes Fascia: No Tendon: No Muscle: No Joint: No Bone: No Small (1-33%) N/A N/A Epithelialization: N/A N/A N/A Debridement: N/A N/A N/A Pain Control: N/A N/A N/A Tissue Debrided: N/A N/A N/A Level: N/A N/A  N/A Debridement A (sq cm): rea N/A N/A N/A Instrument: N/A N/A N/A Bleeding: N/A N/A N/A Hemostasis A chieved: N/A N/A N/A Procedural Pain: N/A N/A N/A Post Procedural Pain: Debridement Treatment Response: N/A N/A N/A Post Debridement Measurements L x N/A N/A N/A W x D (cm) N/A N/A N/A Post Debridement Volume: (cm) N/A N/A N/A Procedures Performed: Treatment Notes Electronic Signature(s) Signed: 09/01/2019 5:53:12 PM By: [name] RN Signed: 09/01/2019 6:13:26 PM By: Linton Ham MD Entered By: Linton Ham on 09/01/2019 10:13:34 -------------------------------------------------------------------------------- Multi-Disciplinary Care Plan Details Patient Name: Date of Service: [name] [name] F. 09/01/2019 9:30 A M Medical Record Number: [id number] Patient Account Number: [id number] Date of Birth/Sex: Treating RN: April 27, 1945 (74 y.o. [name] Primary Care Nicko Daher: [name], [name] Other Clinician: Referring Alonna Bartling: Treating Bralynn Velador/Extender: [name], [name] [name], [name] Weeks in Treatment: 176 Active Inactive Wound/Skin Impairment Nursing Diagnoses: Impaired tissue integrity Goals: Patient/caregiver will verbalize understanding of skin care regimen Date Initiated: 11/19/2017 Target Resolution Date: 09/04/2019 Goal Status: Active Ulcer/skin breakdown will have a volume reduction of 30% by week 4 Date Initiated: 11/19/2017 Date Inactivated: 12/02/2017 Target Resolution Date: 12/22/2017 Goal Status: Unmet Unmet Reason: multipl comorbidities Interventions: Assess patient/caregiver ability to obtain necessary supplies Assess ulceration(s) every visit Provide education on ulcer and skin care Notes: Electronic Signature(s) Signed: 09/01/2019 5:53:12 PM By: [name] RN Entered By: [name] on 09/01/2019 09:14:54 -------------------------------------------------------------------------------- Pain Assessment Details Patient Name: Date of  Service: [name] [name] 09/01/2019 9:30 A M Medical Record Number: [id number] Patient Account Number: [id number] Date of Birth/Sex: Treating RN: 09-Sep-1945 (74 y.o. [name] Primary Care Wille Aubuchon: [name], [name] Other Clinician: Referring Favian Kittleson: Treating  Wound Assessment Details Patient Name: Date of Service: [name] [name] [name] [name] F. 09/01/2019 9:30 A M Medical Record Number: [id number] Patient Account Number: [id number] Date of Birth/Sex: Treating RN: 12/10/1945 (74 y.o. [name] [name]) [name] Primary Care Venna Berberich: [name], [name] Other Clinician: Referring Adyan Palau: Treating Derius Ghosh/Extender: [name], [name] [name], [name] Weeks in Treatment: 176 Wound Status Wound Number: 27 Primary Diabetic Wound/Ulcer of the Lower Extremity Etiology: Wound Location: Right, Lateral Knee Wound Open Wounding Event: Shear/Friction Status: Date Acquired: 08/18/2019 Comorbid Anemia, Sleep Apnea, Arrhythmia, Congestive Heart Failure, Weeks Of Treatment: 2 History: Coronary Artery Disease, Hypertension, Myocardial Infarction, Clustered Wound: No Type II Diabetes, Gout, Neuropathy Wound Measurements Length: (cm) 1.2 Width: (cm) 0.8 Depth: (cm) 0.1 Area: (cm) 0.754 Volume: (cm) 0.075 % Reduction in Area: 50.8% % Reduction in Volume: 51% Epithelialization: Small (1-33%) Tunneling: No Undermining: No Wound Description Classification: Grade 2 Wound Margin: Distinct, outline attached Exudate Amount: Small Exudate Type: Serosanguineous Exudate Color: red, brown Foul Odor After Cleansing:  No Slough/Fibrino No Wound Bed Granulation Amount: Large (67-100%) Exposed Structure Granulation Quality: Red, Pink Fascia Exposed: No Necrotic Amount: None Present (0%) Fat Layer (Subcutaneous Tissue) Exposed: Yes Tendon Exposed: No Muscle Exposed: No Joint Exposed: No Bone Exposed: No Treatment Notes Wound #27 (Right, Lateral Knee) 1. Cleanse With Wound Cleanser Soap and water 2. Periwound Care Skin Prep 3. Primary Dressing Applied Polymem Ag 4. Secondary Dressing Foam Border Dressing Notes polymem ag to knee and santyl to other sites, cotton/coban Electronic Signature(s) Signed: 09/01/2019 5:53:12 PM By: [name] RN Entered By: [name] on 09/01/2019 09:48:27 -------------------------------------------------------------------------------- Vitals Details Patient Name: Date of Service: [name] [name] F. 09/01/2019 9:30 A M Medical Record Number: [id number] Patient Account Number: [id number] Date of Birth/Sex: Treating RN: 08-20-1945 (74 y.o. [name] [name]) [name] Primary Care Elzada Pytel: [name], [name] Other Clinician: Referring Coline Calkin: Treating Gavin Faivre/Extender: [name], [name] [name], [name] Weeks in Treatment: 176 Vital Signs Time Taken: 09:37 Temperature (F): 97.9 Height (in): 74 Pulse (bpm): 77 Weight (lbs): 150 Respiratory Rate (breaths/min): 19 Body Mass Index (BMI): 19.3 Blood Pressure (mmHg): 109/61 Reference Range: 80 - 120 mg / dl Electronic Signature(s) Signed: 09/02/2019 9:08:01 AM By: Sandre Kitty Entered By: Sandre Kitty on 09/01/2019 09:38:15  Keyonda Bickle/Extender: [name], [name] [name], [name] Weeks in Treatment: 176 Active Problems Location of Pain Severity and Description of Pain Patient Has Paino No Site Locations Pain Management and Medication Current Pain Management: Electronic Signature(s) Signed: 09/01/2019 5:53:12 PM By: [name] RN Signed: 09/02/2019 9:08:01 AM By: Sandre Kitty Entered By: Sandre Kitty on 09/01/2019 09:37:04 -------------------------------------------------------------------------------- Patient/Caregiver Education Details Patient Name: Date of Service: [name] [name] 5/11/2021andnbsp9:30 A M Medical Record Number: [id number] Patient Account Number: [id number] Date of Birth/Gender: Treating RN: 1946/01/14 (74 y.o. [name] Primary Care Physician: [name], [name] Other Clinician: Referring Physician: Treating Physician/Extender: [name], [name] [name], [name] Weeks in Treatment: 176 Education Assessment Education Provided To: Patient Education Topics Provided Wound/Skin Impairment: Methods: Explain/Verbal Responses: State content correctly Electronic Signature(s) Signed: 09/01/2019 5:53:12 PM By: [name] RN Entered By: [name] on 09/01/2019 09:15:10 -------------------------------------------------------------------------------- Wound Assessment Details Patient Name: Date of Service: [name] [name] [place] [name] F. 09/01/2019 9:30 A M Medical Record Number: [id number] Patient Account Number: [id number] Date of Birth/Sex: Treating RN: Jun 12, 1945 (74 y.o. [name] [name]) [name] Primary Care Kobey Sides: [name], [name] Other Clinician: Referring Toy Samarin: Treating Rue Tinnel/Extender: [name], [name] [name],  [name] Weeks in Treatment: 176 Wound Status Wound Number: 14 Primary Diabetic Wound/Ulcer of the Lower Extremity Etiology: Wound Location: Left, Lateral Lower Leg Wound Open Wounding Event: Gradually Appeared Status: Date Acquired: 08/15/2017 Comorbid Anemia, Sleep Apnea, Arrhythmia, Congestive Heart Failure, Weeks Of Treatment: 106 History: Coronary Artery Disease, Hypertension, Myocardial Infarction, Clustered Wound: Yes Type II Diabetes, Gout, Neuropathy Wound Measurements Length: (cm) Width: (cm) Depth: (cm) Clustered Quantity: Area: (cm) Volume: (cm) 9.1 % Reduction in Area: -1038.1% 1.5 % Reduction in Volume: -2180.9% 0.2 Epithelialization: Small (1-33%) 2 Tunneling: No 10.721 Undermining: No 2.144 Wound Description Classification: Grade 2 Wound Margin: Well defined, not attached Exudate Amount: Medium Exudate Type: Serosanguineous Exudate Color: red, brown Foul Odor After Cleansing: No Slough/Fibrino Yes Wound Bed Granulation Amount: Large (67-100%) Exposed Structure Granulation Quality: Red, Pink Fascia Exposed: No Necrotic Amount: Small (1-33%) Fat Layer (Subcutaneous Tissue) Exposed: Yes Necrotic Quality: Adherent Slough Tendon Exposed: No Muscle Exposed: No Joint Exposed: No Bone Exposed: No Treatment Notes Wound #14 (Left, Lateral Lower Leg) 1. Cleanse With Wound Cleanser Soap and water 2. Periwound Care Moisturizing lotion TCA Cream 3. Primary Dressing Applied Polymem Ag 4. Secondary Dressing ABD Pad Dry Gauze Heel Cup 6. Support Layer Applied Kerlix/Coban Notes cotton instead of kerlix. netting Electronic Signature(s) Signed: 09/01/2019 5:53:12 PM By: [name] RN Entered By: [name] on 09/01/2019 09:57:35 -------------------------------------------------------------------------------- Wound Assessment Details Patient Name: Date of Service: [name] [name] [facility] F. 09/01/2019 9:30 A M Medical Record Number: [id number] Patient  Account Number: [id number] Date of Birth/Sex: Treating RN: 08-Apr-1946 (74 y.o. [name] [name]) [name] Primary Care Maxum Cassarino: Other Clinician: [name], [name] Referring Kaelyn Innocent: Treating Espn Zeman/Extender: [name], [name] [name], [name] Weeks in Treatment: 176 Wound Status Wound Number: 18 Primary Diabetic Wound/Ulcer of the Lower Extremity Etiology: Wound Location: Left, Proximal, Dorsal Foot Wound Open Wounding Event: Gradually Appeared Status: Date Acquired: 08/26/2018 Comorbid Anemia, Sleep Apnea, Arrhythmia, Congestive Heart Failure, Weeks Of Treatment: 53 History: Coronary Artery Disease, Hypertension, Myocardial Infarction, Clustered Wound: No Type II Diabetes, Gout, Neuropathy Wound Measurements Length: (cm) 0.6 Width: (cm) 0.4 Depth: (cm) 0.1 Area: (cm) 0.188 Volume: (cm) 0.019 % Reduction in Area: 40.1% % Reduction in Volume: 38.7% Epithelialization: Medium (34-66%) Tunneling: No Undermining: No Wound Description Classification: Grade 2 Wound Margin: Well defined, not attached Exudate Amount: Small Exudate Type: Serosanguineous Exudate Color: red, brown Foul Odor  Wound Assessment Details Patient Name: Date of Service: [name] [name] [name] [name] F. 09/01/2019 9:30 A M Medical Record Number: [id number] Patient Account Number: [id number] Date of Birth/Sex: Treating RN: 12/10/1945 (74 y.o. [name] [name]) [name] Primary Care Venna Berberich: [name], [name] Other Clinician: Referring Adyan Palau: Treating Derius Ghosh/Extender: [name], [name] [name], [name] Weeks in Treatment: 176 Wound Status Wound Number: 27 Primary Diabetic Wound/Ulcer of the Lower Extremity Etiology: Wound Location: Right, Lateral Knee Wound Open Wounding Event: Shear/Friction Status: Date Acquired: 08/18/2019 Comorbid Anemia, Sleep Apnea, Arrhythmia, Congestive Heart Failure, Weeks Of Treatment: 2 History: Coronary Artery Disease, Hypertension, Myocardial Infarction, Clustered Wound: No Type II Diabetes, Gout, Neuropathy Wound Measurements Length: (cm) 1.2 Width: (cm) 0.8 Depth: (cm) 0.1 Area: (cm) 0.754 Volume: (cm) 0.075 % Reduction in Area: 50.8% % Reduction in Volume: 51% Epithelialization: Small (1-33%) Tunneling: No Undermining: No Wound Description Classification: Grade 2 Wound Margin: Distinct, outline attached Exudate Amount: Small Exudate Type: Serosanguineous Exudate Color: red, brown Foul Odor After Cleansing:  No Slough/Fibrino No Wound Bed Granulation Amount: Large (67-100%) Exposed Structure Granulation Quality: Red, Pink Fascia Exposed: No Necrotic Amount: None Present (0%) Fat Layer (Subcutaneous Tissue) Exposed: Yes Tendon Exposed: No Muscle Exposed: No Joint Exposed: No Bone Exposed: No Treatment Notes Wound #27 (Right, Lateral Knee) 1. Cleanse With Wound Cleanser Soap and water 2. Periwound Care Skin Prep 3. Primary Dressing Applied Polymem Ag 4. Secondary Dressing Foam Border Dressing Notes polymem ag to knee and santyl to other sites, cotton/coban Electronic Signature(s) Signed: 09/01/2019 5:53:12 PM By: [name] RN Entered By: [name] on 09/01/2019 09:48:27 -------------------------------------------------------------------------------- Vitals Details Patient Name: Date of Service: [name] [name] F. 09/01/2019 9:30 A M Medical Record Number: [id number] Patient Account Number: [id number] Date of Birth/Sex: Treating RN: 08-20-1945 (74 y.o. [name] [name]) [name] Primary Care Elzada Pytel: [name], [name] Other Clinician: Referring Coline Calkin: Treating Gavin Faivre/Extender: [name], [name] [name], [name] Weeks in Treatment: 176 Vital Signs Time Taken: 09:37 Temperature (F): 97.9 Height (in): 74 Pulse (bpm): 77 Weight (lbs): 150 Respiratory Rate (breaths/min): 19 Body Mass Index (BMI): 19.3 Blood Pressure (mmHg): 109/61 Reference Range: 80 - 120 mg / dl Electronic Signature(s) Signed: 09/02/2019 9:08:01 AM By: Sandre Kitty Entered By: [email or web address] on 09/01/2019 09:38:15

## 2019-09-02 NOTE — Progress Notes (Signed)
Reduction in Area: -1025.6% % Reduction in Volume: -1027.7% Epithelialization: Small (1-33%) Tunneling: No Undermining: No Wound Description Classification: Grade 2 Wound Margin: Well defined, not attached Exudate Amount: Medium Exudate Type: Serosanguineous Exudate Color: red, brown Foul Odor After Cleansing: No Slough/Fibrino Yes Wound Bed Granulation Amount: Large (67-100%) Exposed Structure Granulation Quality: Red, Pink Fascia Exposed: No Necrotic Amount: Small (1-33%) Fat Layer (Subcutaneous Tissue) Exposed: Yes Necrotic Quality: Adherent Slough Tendon Exposed: No Muscle Exposed: No Joint Exposed: No Bone Exposed: No Electronic Signature(s) Signed: 08/18/2019 5:28:23 PM By: [name] Coria RN Entered By: [name] [name] on 08/18/2019 09:26:41 -------------------------------------------------------------------------------- Wound Assessment Details Patient Name: Date of Service: [name] Foley F. 08/18/2019 9:15 A M Medical Record Number: [id number] Patient Account Number:  [id number] Date of Birth/Sex: Treating RN: [birth year] (74 y.o. [name] [name]) [name] [name] Primary Care Adair Lauderback: [name], [name] Other Clinician: Referring [name] [name]: Treating [name] [name]/Extender: [name], [name] [name], [name] Weeks in Treatment: 174 Wound Status Wound Number: 18 Primary Diabetic Wound/Ulcer of the Lower Extremity Etiology: Wound Location: Left, Proximal, Dorsal Foot Wound Open Wounding Event: Gradually Appeared Status: Date Acquired: 08/26/2018 Comorbid Anemia, Sleep Apnea, Arrhythmia, Congestive Heart Failure, Weeks Of Treatment: 51 History: Coronary Artery Disease, Hypertension, Myocardial Infarction, Clustered Wound: No Type II Diabetes, Gout, Neuropathy Photos Photo Uploaded By: [name] [name] on 08/19/2019 11:29:40 Wound Measurements Length: (cm) 0.7 Width: (cm) 0.6 Depth: (cm) 0.1 Area: (cm) 0.33 Volume: (cm) 0.033 Wound Description Classification: Grade 2 Wound Margin: Well defined, not attached Exudate Amount: Small Exudate Type: Serosanguineous Exudate Color: red, brown Foul Odor After Cleansing: Slough/Fibrino % Reduction in Area: -5.1% % Reduction in Volume: -6.5% Epithelialization: Medium (34-66%) Tunneling: No Undermining: No No Yes Wound Bed Granulation Amount: Large (67-100%) Exposed Structure Granulation Quality: Pink Fascia Exposed: No Necrotic Amount: Small (1-33%) Fat Layer (Subcutaneous Tissue) Exposed: Yes Necrotic Quality: Eschar, Adherent Slough Tendon Exposed: No Muscle Exposed: No Joint Exposed: No Bone Exposed: No Electronic Signature(s) Signed: 08/18/2019 5:28:23 PM By: [name] Coria RN Entered By: [name] [name] on 08/18/2019 09:27:15 -------------------------------------------------------------------------------- Wound Assessment Details Patient Name: Date of Service: [name] Foley F. 08/18/2019 9:15 A M Medical Record Number: [id number] Patient Account Number: [id number] Date of Birth/Sex: Treating RN: [name] [name], [name] [name] (74 y.o.  [name] [name] Primary Care Pearlie Lafosse: [name], [name] Other Clinician: Referring Mallery Harshman: Treating Camry Theiss/Extender: [name], [name] [name], [name] Weeks in Treatment: 174 Wound Status Wound Number: 23 Primary Diabetic Wound/Ulcer of the Lower Extremity Etiology: Wound Location: Right, Lateral Calcaneus Secondary Pressure Ulcer Wounding Event: Pressure Injury Etiology: Date Acquired: 04/28/2019 Wound Open Weeks Of Treatment: 16 Status: Clustered Wound: No Comorbid Anemia, Sleep Apnea, Arrhythmia, Congestive Heart Failure, History: Coronary Artery Disease, Hypertension, Myocardial Infarction, Type II Diabetes, Gout, Neuropathy Photos Photo Uploaded By: [name] [name] on 08/19/2019 11:28:31 Wound Measurements Length: (cm) 3.6 Width: (cm) 1.4 Depth: (cm) 0.1 Area: (cm) 3.958 Volume: (cm) 0.396 % Reduction in Area: 32.8% % Reduction in Volume: 32.8% Epithelialization: Small (1-33%) Tunneling: No Undermining: No Wound Description Classification: Grade 2 Wound Margin: Flat and Intact Exudate Amount: Medium Exudate Type: Serosanguineous Exudate Color: red, brown Foul Odor After Cleansing: No Slough/Fibrino Yes Wound Bed Granulation Amount: Medium (34-66%) Exposed Structure Granulation Quality: Pink Fascia Exposed: No Necrotic Amount: Medium (34-66%) Fat Layer (Subcutaneous Tissue) Exposed: Yes Necrotic Quality: Eschar, Adherent Slough Tendon Exposed: No Muscle Exposed: No Joint Exposed: No Bone Exposed: No Electronic Signature(s) Signed: 08/18/2019 5:28:23 PM By: [name] Coria RN Entered By: [name] [name] on 08/18/2019 09:27:46 -------------------------------------------------------------------------------- Wound Assessment Details Patient Name: Date of Service: [name] [name]  Epithelialization: None Tunneling: No Undermining: No Wound Description Classification: Grade 2 Exudate Amount: Medium Exudate Type: Serosanguineous Exudate Color: red, brown Foul Odor After Cleansing: No Slough/Fibrino Yes Wound Bed Granulation Amount: Medium (34-66%) Exposed Structure Granulation Quality: Pink Fascia Exposed: No Necrotic Amount: Medium (34-66%) Fat Layer (Subcutaneous Tissue) Exposed: Yes Necrotic Quality: Adherent Slough Tendon Exposed: No Muscle Exposed: No Joint Exposed: No Bone Exposed: No Electronic Signature(s) Signed: 08/18/2019 5:28:23 PM By: [name] Coria RN Entered By: [name] [name] on 08/18/2019 09:29:27 -------------------------------------------------------------------------------- Wound Assessment Details Patient Name: Date of Service: [name] Foley F. 08/18/2019 9:15 A M Medical Record Number: [id number] Patient Account Number: [id number] Date of Birth/Sex: Treating RN: [name] [name] (74 y.o. [name] [name]) [name] [name] Primary Care Pau Banh: [name], [name] Other Clinician: Referring Ladrea Holladay: Treating Antion Andres/Extender: [name], [name] [name], [name] Weeks in Treatment: 174 Wound Status Wound Number: 27 Primary Diabetic Wound/Ulcer of the Lower Extremity Etiology: Wound Location: Right, Lateral Knee Wound Open Wounding Event: Shear/Friction Status: Date Acquired:  08/18/2019 Comorbid Anemia, Sleep Apnea, Arrhythmia, Congestive Heart Failure, Weeks Of Treatment: 0 History: Coronary Artery Disease, Hypertension, Myocardial Infarction, Clustered Wound: No Type II Diabetes, Gout, Neuropathy Photos Photo Uploaded By: [name] [name] on 08/19/2019 11:29:47 Wound Measurements Length: (cm) 1.5 Width: (cm) 1.3 Depth: (cm) 0.1 Area: (cm) 1.532 Volume: (cm) 0.153 % Reduction in Area: 0% % Reduction in Volume: 0% Epithelialization: None Tunneling: No Undermining: No Wound Description Classification: Grade 2 Exudate Amount: Medium Exudate Type: Serosanguineous Exudate Color: red, brown Foul Odor After Cleansing: No Slough/Fibrino Yes Wound Bed Granulation Amount: Large (67-100%) Exposed Structure Granulation Quality: Red Fascia Exposed: No Necrotic Amount: Small (1-33%) Fat Layer (Subcutaneous Tissue) Exposed: Yes Necrotic Quality: Adherent Slough Tendon Exposed: No Muscle Exposed: No Joint Exposed: No Bone Exposed: No Electronic Signature(s) Signed: 08/18/2019 5:28:23 PM By: [name] Coria RN Entered By: [name] [name] on 08/18/2019 09:30:06 -------------------------------------------------------------------------------- Vitals Details Patient Name: Date of Service: [name] Foley F. 08/18/2019 9:15 A M Medical Record Number: [id number] Patient Account Number: [id number] Date of Birth/Sex: Treating RN: [birth year] (74 y.o. [name] [name] Primary Care Swayze Pries: [name], [name] Other Clinician: Referring Teya Otterson: Treating Madelaine Whipple/Extender: [name], [name] [name], [name] Weeks in Treatment: 174 Vital Signs Time Taken: 09:00 Temperature (F): 98.1 Height (in): 74 Pulse (bpm): 80 Weight (lbs): 150 Respiratory Rate (breaths/min): [name] Body Mass Index (BMI): [name].3 Blood Pressure (mmHg): 109/62 Reference Range: 80 - 120 mg / dl Electronic Signature(s) Signed: 09/02/2019 9:09:16 AM By: [name] [name] Entered By: [name] [name] on  08/18/2019 09:00:44  Reduction in Area: -1025.6% % Reduction in Volume: -1027.7% Epithelialization: Small (1-33%) Tunneling: No Undermining: No Wound Description Classification: Grade 2 Wound Margin: Well defined, not attached Exudate Amount: Medium Exudate Type: Serosanguineous Exudate Color: red, brown Foul Odor After Cleansing: No Slough/Fibrino Yes Wound Bed Granulation Amount: Large (67-100%) Exposed Structure Granulation Quality: Red, Pink Fascia Exposed: No Necrotic Amount: Small (1-33%) Fat Layer (Subcutaneous Tissue) Exposed: Yes Necrotic Quality: Adherent Slough Tendon Exposed: No Muscle Exposed: No Joint Exposed: No Bone Exposed: No Electronic Signature(s) Signed: 08/18/2019 5:28:23 PM By: [name] Coria RN Entered By: [name] [name] on 08/18/2019 09:26:41 -------------------------------------------------------------------------------- Wound Assessment Details Patient Name: Date of Service: [name] Foley F. 08/18/2019 9:15 A M Medical Record Number: [id number] Patient Account Number:  [id number] Date of Birth/Sex: Treating RN: [birth year] (74 y.o. [name] [name]) [name] [name] Primary Care Adair Lauderback: [name], [name] Other Clinician: Referring [name] [name]: Treating [name] [name]/Extender: [name], [name] [name], [name] Weeks in Treatment: 174 Wound Status Wound Number: 18 Primary Diabetic Wound/Ulcer of the Lower Extremity Etiology: Wound Location: Left, Proximal, Dorsal Foot Wound Open Wounding Event: Gradually Appeared Status: Date Acquired: 08/26/2018 Comorbid Anemia, Sleep Apnea, Arrhythmia, Congestive Heart Failure, Weeks Of Treatment: 51 History: Coronary Artery Disease, Hypertension, Myocardial Infarction, Clustered Wound: No Type II Diabetes, Gout, Neuropathy Photos Photo Uploaded By: [name] [name] on 08/19/2019 11:29:40 Wound Measurements Length: (cm) 0.7 Width: (cm) 0.6 Depth: (cm) 0.1 Area: (cm) 0.33 Volume: (cm) 0.033 Wound Description Classification: Grade 2 Wound Margin: Well defined, not attached Exudate Amount: Small Exudate Type: Serosanguineous Exudate Color: red, brown Foul Odor After Cleansing: Slough/Fibrino % Reduction in Area: -5.1% % Reduction in Volume: -6.5% Epithelialization: Medium (34-66%) Tunneling: No Undermining: No No Yes Wound Bed Granulation Amount: Large (67-100%) Exposed Structure Granulation Quality: Pink Fascia Exposed: No Necrotic Amount: Small (1-33%) Fat Layer (Subcutaneous Tissue) Exposed: Yes Necrotic Quality: Eschar, Adherent Slough Tendon Exposed: No Muscle Exposed: No Joint Exposed: No Bone Exposed: No Electronic Signature(s) Signed: 08/18/2019 5:28:23 PM By: [name] Coria RN Entered By: [name] [name] on 08/18/2019 09:27:15 -------------------------------------------------------------------------------- Wound Assessment Details Patient Name: Date of Service: [name] Foley F. 08/18/2019 9:15 A M Medical Record Number: [id number] Patient Account Number: [id number] Date of Birth/Sex: Treating RN: [name] [name], [name] [name] (74 y.o.  [name] [name] Primary Care Pearlie Lafosse: [name], [name] Other Clinician: Referring Mallery Harshman: Treating Camry Theiss/Extender: [name], [name] [name], [name] Weeks in Treatment: 174 Wound Status Wound Number: 23 Primary Diabetic Wound/Ulcer of the Lower Extremity Etiology: Wound Location: Right, Lateral Calcaneus Secondary Pressure Ulcer Wounding Event: Pressure Injury Etiology: Date Acquired: 04/28/2019 Wound Open Weeks Of Treatment: 16 Status: Clustered Wound: No Comorbid Anemia, Sleep Apnea, Arrhythmia, Congestive Heart Failure, History: Coronary Artery Disease, Hypertension, Myocardial Infarction, Type II Diabetes, Gout, Neuropathy Photos Photo Uploaded By: [name] [name] on 08/19/2019 11:28:31 Wound Measurements Length: (cm) 3.6 Width: (cm) 1.4 Depth: (cm) 0.1 Area: (cm) 3.958 Volume: (cm) 0.396 % Reduction in Area: 32.8% % Reduction in Volume: 32.8% Epithelialization: Small (1-33%) Tunneling: No Undermining: No Wound Description Classification: Grade 2 Wound Margin: Flat and Intact Exudate Amount: Medium Exudate Type: Serosanguineous Exudate Color: red, brown Foul Odor After Cleansing: No Slough/Fibrino Yes Wound Bed Granulation Amount: Medium (34-66%) Exposed Structure Granulation Quality: Pink Fascia Exposed: No Necrotic Amount: Medium (34-66%) Fat Layer (Subcutaneous Tissue) Exposed: Yes Necrotic Quality: Eschar, Adherent Slough Tendon Exposed: No Muscle Exposed: No Joint Exposed: No Bone Exposed: No Electronic Signature(s) Signed: 08/18/2019 5:28:23 PM By: [name] Coria RN Entered By: [name] [name] on 08/18/2019 09:27:46 -------------------------------------------------------------------------------- Wound Assessment Details Patient Name: Date of Service: [name] [name]  Epithelialization: None Tunneling: No Undermining: No Wound Description Classification: Grade 2 Exudate Amount: Medium Exudate Type: Serosanguineous Exudate Color: red, brown Foul Odor After Cleansing: No Slough/Fibrino Yes Wound Bed Granulation Amount: Medium (34-66%) Exposed Structure Granulation Quality: Pink Fascia Exposed: No Necrotic Amount: Medium (34-66%) Fat Layer (Subcutaneous Tissue) Exposed: Yes Necrotic Quality: Adherent Slough Tendon Exposed: No Muscle Exposed: No Joint Exposed: No Bone Exposed: No Electronic Signature(s) Signed: 08/18/2019 5:28:23 PM By: [name] Coria RN Entered By: [name] [name] on 08/18/2019 09:29:27 -------------------------------------------------------------------------------- Wound Assessment Details Patient Name: Date of Service: [name] Foley F. 08/18/2019 9:15 A M Medical Record Number: [id number] Patient Account Number: [id number] Date of Birth/Sex: Treating RN: [name] [name] (74 y.o. [name] [name]) [name] [name] Primary Care Pau Banh: [name], [name] Other Clinician: Referring Ladrea Holladay: Treating Antion Andres/Extender: [name], [name] [name], [name] Weeks in Treatment: 174 Wound Status Wound Number: 27 Primary Diabetic Wound/Ulcer of the Lower Extremity Etiology: Wound Location: Right, Lateral Knee Wound Open Wounding Event: Shear/Friction Status: Date Acquired:  08/18/2019 Comorbid Anemia, Sleep Apnea, Arrhythmia, Congestive Heart Failure, Weeks Of Treatment: 0 History: Coronary Artery Disease, Hypertension, Myocardial Infarction, Clustered Wound: No Type II Diabetes, Gout, Neuropathy Photos Photo Uploaded By: [name] [name] on 08/19/2019 11:29:47 Wound Measurements Length: (cm) 1.5 Width: (cm) 1.3 Depth: (cm) 0.1 Area: (cm) 1.532 Volume: (cm) 0.153 % Reduction in Area: 0% % Reduction in Volume: 0% Epithelialization: None Tunneling: No Undermining: No Wound Description Classification: Grade 2 Exudate Amount: Medium Exudate Type: Serosanguineous Exudate Color: red, brown Foul Odor After Cleansing: No Slough/Fibrino Yes Wound Bed Granulation Amount: Large (67-100%) Exposed Structure Granulation Quality: Red Fascia Exposed: No Necrotic Amount: Small (1-33%) Fat Layer (Subcutaneous Tissue) Exposed: Yes Necrotic Quality: Adherent Slough Tendon Exposed: No Muscle Exposed: No Joint Exposed: No Bone Exposed: No Electronic Signature(s) Signed: 08/18/2019 5:28:23 PM By: [name] Coria RN Entered By: [name] [name] on 08/18/2019 09:30:06 -------------------------------------------------------------------------------- Vitals Details Patient Name: Date of Service: [name] Foley F. 08/18/2019 9:15 A M Medical Record Number: [id number] Patient Account Number: [id number] Date of Birth/Sex: Treating RN: [birth year] (74 y.o. [name] [name] Primary Care Swayze Pries: [name], [name] Other Clinician: Referring Teya Otterson: Treating Madelaine Whipple/Extender: [name], [name] [name], [name] Weeks in Treatment: 174 Vital Signs Time Taken: 09:00 Temperature (F): 98.1 Height (in): 74 Pulse (bpm): 80 Weight (lbs): 150 Respiratory Rate (breaths/min): [name] Body Mass Index (BMI): [name].3 Blood Pressure (mmHg): 109/62 Reference Range: 80 - 120 mg / dl Electronic Signature(s) Signed: 09/02/2019 9:09:16 AM By: [name] [name] Entered By: [name] [name] on  08/18/2019 09:00:44  Reduction in Area: -1025.6% % Reduction in Volume: -1027.7% Epithelialization: Small (1-33%) Tunneling: No Undermining: No Wound Description Classification: Grade 2 Wound Margin: Well defined, not attached Exudate Amount: Medium Exudate Type: Serosanguineous Exudate Color: red, brown Foul Odor After Cleansing: No Slough/Fibrino Yes Wound Bed Granulation Amount: Large (67-100%) Exposed Structure Granulation Quality: Red, Pink Fascia Exposed: No Necrotic Amount: Small (1-33%) Fat Layer (Subcutaneous Tissue) Exposed: Yes Necrotic Quality: Adherent Slough Tendon Exposed: No Muscle Exposed: No Joint Exposed: No Bone Exposed: No Electronic Signature(s) Signed: 08/18/2019 5:28:23 PM By: [name] Coria RN Entered By: [name] [name] on 08/18/2019 09:26:41 -------------------------------------------------------------------------------- Wound Assessment Details Patient Name: Date of Service: [name] Foley F. 08/18/2019 9:15 A M Medical Record Number: [id number] Patient Account Number:  [id number] Date of Birth/Sex: Treating RN: [birth year] (74 y.o. [name] [name]) [name] [name] Primary Care Adair Lauderback: [name], [name] Other Clinician: Referring [name] [name]: Treating [name] [name]/Extender: [name], [name] [name], [name] Weeks in Treatment: 174 Wound Status Wound Number: 18 Primary Diabetic Wound/Ulcer of the Lower Extremity Etiology: Wound Location: Left, Proximal, Dorsal Foot Wound Open Wounding Event: Gradually Appeared Status: Date Acquired: 08/26/2018 Comorbid Anemia, Sleep Apnea, Arrhythmia, Congestive Heart Failure, Weeks Of Treatment: 51 History: Coronary Artery Disease, Hypertension, Myocardial Infarction, Clustered Wound: No Type II Diabetes, Gout, Neuropathy Photos Photo Uploaded By: [name] [name] on 08/19/2019 11:29:40 Wound Measurements Length: (cm) 0.7 Width: (cm) 0.6 Depth: (cm) 0.1 Area: (cm) 0.33 Volume: (cm) 0.033 Wound Description Classification: Grade 2 Wound Margin: Well defined, not attached Exudate Amount: Small Exudate Type: Serosanguineous Exudate Color: red, brown Foul Odor After Cleansing: Slough/Fibrino % Reduction in Area: -5.1% % Reduction in Volume: -6.5% Epithelialization: Medium (34-66%) Tunneling: No Undermining: No No Yes Wound Bed Granulation Amount: Large (67-100%) Exposed Structure Granulation Quality: Pink Fascia Exposed: No Necrotic Amount: Small (1-33%) Fat Layer (Subcutaneous Tissue) Exposed: Yes Necrotic Quality: Eschar, Adherent Slough Tendon Exposed: No Muscle Exposed: No Joint Exposed: No Bone Exposed: No Electronic Signature(s) Signed: 08/18/2019 5:28:23 PM By: [name] Coria RN Entered By: [name] [name] on 08/18/2019 09:27:15 -------------------------------------------------------------------------------- Wound Assessment Details Patient Name: Date of Service: [name] Foley F. 08/18/2019 9:15 A M Medical Record Number: [id number] Patient Account Number: [id number] Date of Birth/Sex: Treating RN: [name] [name], [name] [name] (74 y.o.  [name] [name] Primary Care Pearlie Lafosse: [name], [name] Other Clinician: Referring Mallery Harshman: Treating Camry Theiss/Extender: [name], [name] [name], [name] Weeks in Treatment: 174 Wound Status Wound Number: 23 Primary Diabetic Wound/Ulcer of the Lower Extremity Etiology: Wound Location: Right, Lateral Calcaneus Secondary Pressure Ulcer Wounding Event: Pressure Injury Etiology: Date Acquired: 04/28/2019 Wound Open Weeks Of Treatment: 16 Status: Clustered Wound: No Comorbid Anemia, Sleep Apnea, Arrhythmia, Congestive Heart Failure, History: Coronary Artery Disease, Hypertension, Myocardial Infarction, Type II Diabetes, Gout, Neuropathy Photos Photo Uploaded By: [name] [name] on 08/19/2019 11:28:31 Wound Measurements Length: (cm) 3.6 Width: (cm) 1.4 Depth: (cm) 0.1 Area: (cm) 3.958 Volume: (cm) 0.396 % Reduction in Area: 32.8% % Reduction in Volume: 32.8% Epithelialization: Small (1-33%) Tunneling: No Undermining: No Wound Description Classification: Grade 2 Wound Margin: Flat and Intact Exudate Amount: Medium Exudate Type: Serosanguineous Exudate Color: red, brown Foul Odor After Cleansing: No Slough/Fibrino Yes Wound Bed Granulation Amount: Medium (34-66%) Exposed Structure Granulation Quality: Pink Fascia Exposed: No Necrotic Amount: Medium (34-66%) Fat Layer (Subcutaneous Tissue) Exposed: Yes Necrotic Quality: Eschar, Adherent Slough Tendon Exposed: No Muscle Exposed: No Joint Exposed: No Bone Exposed: No Electronic Signature(s) Signed: 08/18/2019 5:28:23 PM By: [name] Coria RN Entered By: [name] [name] on 08/18/2019 09:27:46 -------------------------------------------------------------------------------- Wound Assessment Details Patient Name: Date of Service: [name] [name]  Epithelialization: None Tunneling: No Undermining: No Wound Description Classification: Grade 2 Exudate Amount: Medium Exudate Type: Serosanguineous Exudate Color: red, brown Foul Odor After Cleansing: No Slough/Fibrino Yes Wound Bed Granulation Amount: Medium (34-66%) Exposed Structure Granulation Quality: Pink Fascia Exposed: No Necrotic Amount: Medium (34-66%) Fat Layer (Subcutaneous Tissue) Exposed: Yes Necrotic Quality: Adherent Slough Tendon Exposed: No Muscle Exposed: No Joint Exposed: No Bone Exposed: No Electronic Signature(s) Signed: 08/18/2019 5:28:23 PM By: [name] Coria RN Entered By: [name] [name] on 08/18/2019 09:29:27 -------------------------------------------------------------------------------- Wound Assessment Details Patient Name: Date of Service: [name] Foley F. 08/18/2019 9:15 A M Medical Record Number: [id number] Patient Account Number: [id number] Date of Birth/Sex: Treating RN: [name] [name] (74 y.o. [name] [name]) [name] [name] Primary Care Pau Banh: [name], [name] Other Clinician: Referring Ladrea Holladay: Treating Antion Andres/Extender: [name], [name] [name], [name] Weeks in Treatment: 174 Wound Status Wound Number: 27 Primary Diabetic Wound/Ulcer of the Lower Extremity Etiology: Wound Location: Right, Lateral Knee Wound Open Wounding Event: Shear/Friction Status: Date Acquired:  08/18/2019 Comorbid Anemia, Sleep Apnea, Arrhythmia, Congestive Heart Failure, Weeks Of Treatment: 0 History: Coronary Artery Disease, Hypertension, Myocardial Infarction, Clustered Wound: No Type II Diabetes, Gout, Neuropathy Photos Photo Uploaded By: [name] [name] on 08/19/2019 11:29:47 Wound Measurements Length: (cm) 1.5 Width: (cm) 1.3 Depth: (cm) 0.1 Area: (cm) 1.532 Volume: (cm) 0.153 % Reduction in Area: 0% % Reduction in Volume: 0% Epithelialization: None Tunneling: No Undermining: No Wound Description Classification: Grade 2 Exudate Amount: Medium Exudate Type: Serosanguineous Exudate Color: red, brown Foul Odor After Cleansing: No Slough/Fibrino Yes Wound Bed Granulation Amount: Large (67-100%) Exposed Structure Granulation Quality: Red Fascia Exposed: No Necrotic Amount: Small (1-33%) Fat Layer (Subcutaneous Tissue) Exposed: Yes Necrotic Quality: Adherent Slough Tendon Exposed: No Muscle Exposed: No Joint Exposed: No Bone Exposed: No Electronic Signature(s) Signed: 08/18/2019 5:28:23 PM By: [name] Coria RN Entered By: [name] [name] on 08/18/2019 09:30:06 -------------------------------------------------------------------------------- Vitals Details Patient Name: Date of Service: [name] Foley F. 08/18/2019 9:15 A M Medical Record Number: [id number] Patient Account Number: [id number] Date of Birth/Sex: Treating RN: [birth year] (74 y.o. [name] [name] Primary Care Swayze Pries: [name], [name] Other Clinician: Referring Teya Otterson: Treating Madelaine Whipple/Extender: [name], [name] [name], [name] Weeks in Treatment: 174 Vital Signs Time Taken: 09:00 Temperature (F): 98.1 Height (in): 74 Pulse (bpm): 80 Weight (lbs): 150 Respiratory Rate (breaths/min): [name] Body Mass Index (BMI): [name].3 Blood Pressure (mmHg): 109/62 Reference Range: 80 - 120 mg / dl Electronic Signature(s) Signed: 09/02/2019 9:09:16 AM By: [name] [name] Entered By: [name] [name] on  08/18/2019 09:00:44  JAQUAVIOUS, MERCER ([id number]) Visit Report for 08/18/2019 Arrival Information Details Patient Name: Date of Service: [name] [name] [facility] F. 08/18/2019 9:15 A M Medical Record Number: [id number] Patient Account Number: [id number] Date of Birth/Sex: Treating RN: [birth year][name] [name] (74 y.o. [name] [name]) [name] [name] Primary Care Son Barkan: [name], [name] Other Clinician: Referring Neveah Bang: Treating Haileyann Staiger/Extender: [name], [name] [name], [name] Weeks in Treatment: 174 Visit Information History Since Last Visit Added or deleted any medications: No Patient Arrived: Wheel Chair Any new allergies or adverse reactions: No Arrival Time: 08:58 Had a fall or experienced change in No Accompanied By: daughter activities of daily living that may affect Transfer Assistance: None risk of falls: Patient Identification Verified: Yes Signs or symptoms of abuse/neglect since last visito No Secondary Verification Process Completed: Yes Hospitalized since last visit: No Patient Requires Transmission-Based Precautions: No Implantable device outside of the clinic excluding No Patient Has Alerts: Yes cellular tissue based products placed in the center Patient Alerts: Patient on Blood Thinner since last visit: left ABI 1.0; rt ABI 1.09 Has Dressing in Place as Prescribed: Yes Pain Present Now: No Electronic Signature(s) Signed: 09/02/2019 9:09:16 AM By: [name] [name] Entered By: [name] [name] on 08/18/2019 09:00:28 -------------------------------------------------------------------------------- Encounter Discharge Information Details Patient Name: Date of Service: [name] Foley F. 08/18/2019 9:15 A M Medical Record Number: [id number] Patient Account Number: [id number] Date of Birth/Sex: Treating RN: [birth year][name] [name] (74 y.o. [name] [name] Primary Care Vernecia Umble: [name], [name] Other Clinician: Referring Chyrel Taha: Treating Brace Welte/Extender: [name], [name] [name], [name] Weeks in Treatment: 174 Encounter  Discharge Information Items Post Procedure Vitals Discharge Condition: Stable Temperature (F): 98.1 Ambulatory Status: Wheelchair Pulse (bpm): 80 Discharge Destination: Home Respiratory Rate (breaths/min): [name] Transportation: Private Auto Blood Pressure (mmHg): 109/62 Accompanied By: daughter Schedule Follow-up Appointment: Yes Clinical Summary of Care: Patient Declined Electronic Signature(s) Signed: 08/18/2019 5:26:58 PM By: [name] [name] Entered By: [name] [name] on 08/18/2019 10:[name]:05 -------------------------------------------------------------------------------- Lower Extremity Assessment Details Patient Name: Date of Service: [name] Foley F. 08/18/2019 9:15 A M Medical Record Number: [id number] Patient Account Number: [id number] Date of Birth/Sex: Treating RN: [name] [name][birth year][name] (74 y.o. [name] [name] Primary Care Romone Shaff: [name], [name] Other Clinician: Referring Matsuko Kretz: Treating [name] [name]/Extender: [name], [name] [name], [name] Weeks in Treatment: 174 Edema Assessment Assessed: [name] [name]: No] [Right: No] Edema: [Left: Yes] [Right: Yes] Calf Left: Right: Point of Measurement: 36 cm From Medial Instep 20.5 cm 28 cm Ankle Left: Right: Point of Measurement: 11 cm From Medial Instep 18 cm 20.5 cm Electronic Signature(s) Signed: 08/18/2019 5:28:23 PM By: [name] Coria RN Entered By: [name] [name] on 08/18/2019 09:30:18 -------------------------------------------------------------------------------- Multi Wound Chart Details Patient Name: Date of Service: [name] Foley F. 08/18/2019 9:15 A M Medical Record Number: [id number] Patient Account Number: [id number] Date of Birth/Sex: Treating RN: [birth year][name] [name] (74 y.o. [name] [name] Primary Care Nadia Viar: [name], [name] Other Clinician: Referring Blayke Cordrey: Treating Vinaya Sancho/Extender: [name], [name] [name], [name] Weeks in Treatment: 174 Vital Signs Height(in): 74 Pulse(bpm): 80 Weight(lbs): 150 Blood  Pressure(mmHg): 109/62 Body Mass Index(BMI): [name] Temperature(F): 98.1 Respiratory Rate(breaths/min): [name] Photos: [14:No Photos Left, Lateral Lower Leg] [18:No Photos Left, Proximal, Dorsal Foot] [23:No Photos Right, Lateral Calcaneus] Wound Location: [14:Gradually Appeared] [18:Gradually Appeared] [23:Pressure Injury] Wounding Event: [14:Diabetic Wound/Ulcer of the Lower] [18:Diabetic Wound/Ulcer of the Lower] [23:Diabetic Wound/Ulcer of the Lower] Primary Etiology: [14:Extremity N/A] [18:Extremity N/A] [23:Extremity Pressure Ulcer] Secondary Etiology: [14:Anemia, Sleep Apnea, Arrhythmia,] [18:Anemia, Sleep Apnea, Arrhythmia,] [23:Anemia, Sleep Apnea, Arrhythmia,] Comorbid History: [14:Congestive Heart Failure, Coronary Artery Disease, Hypertension, Myocardial Infarction, Type II Diabetes, Gout, Neuropathy 08/15/2017] [

## 2019-09-11 ENCOUNTER — Other Ambulatory Visit (HOSPITAL_COMMUNITY): Payer: Self-pay | Admitting: Internal Medicine

## 2019-09-11 ENCOUNTER — Other Ambulatory Visit: Payer: Self-pay | Admitting: Family Medicine

## 2019-09-11 DIAGNOSIS — I130 Hypertensive heart and chronic kidney disease with heart failure and stage 1 through stage 4 chronic kidney disease, or unspecified chronic kidney disease: Secondary | ICD-10-CM

## 2019-09-15 ENCOUNTER — Encounter (HOSPITAL_BASED_OUTPATIENT_CLINIC_OR_DEPARTMENT_OTHER): Payer: Medicare HMO | Admitting: Internal Medicine

## 2019-09-15 DIAGNOSIS — E11621 Type 2 diabetes mellitus with foot ulcer: Secondary | ICD-10-CM | POA: Diagnosis not present

## 2019-09-15 DIAGNOSIS — I130 Hypertensive heart and chronic kidney disease with heart failure and stage 1 through stage 4 chronic kidney disease, or unspecified chronic kidney disease: Secondary | ICD-10-CM | POA: Diagnosis not present

## 2019-09-15 DIAGNOSIS — N183 Chronic kidney disease, stage 3 unspecified: Secondary | ICD-10-CM | POA: Diagnosis not present

## 2019-09-15 DIAGNOSIS — E1140 Type 2 diabetes mellitus with diabetic neuropathy, unspecified: Secondary | ICD-10-CM | POA: Diagnosis not present

## 2019-09-15 DIAGNOSIS — I5040 Unspecified combined systolic (congestive) and diastolic (congestive) heart failure: Secondary | ICD-10-CM | POA: Diagnosis not present

## 2019-09-15 DIAGNOSIS — L89613 Pressure ulcer of right heel, stage 3: Secondary | ICD-10-CM | POA: Diagnosis not present

## 2019-09-15 DIAGNOSIS — L97522 Non-pressure chronic ulcer of other part of left foot with fat layer exposed: Secondary | ICD-10-CM | POA: Diagnosis not present

## 2019-09-15 DIAGNOSIS — E1122 Type 2 diabetes mellitus with diabetic chronic kidney disease: Secondary | ICD-10-CM | POA: Diagnosis not present

## 2019-09-15 DIAGNOSIS — L97321 Non-pressure chronic ulcer of left ankle limited to breakdown of skin: Secondary | ICD-10-CM | POA: Diagnosis not present

## 2019-09-15 DIAGNOSIS — L97221 Non-pressure chronic ulcer of left calf limited to breakdown of skin: Secondary | ICD-10-CM | POA: Diagnosis not present

## 2019-09-17 NOTE — Progress Notes (Signed)
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 tibia looks mostly the same. Left dorsal foot and ankle improved and the area on the right lateral calcaneus is quite a bit worse. He has had previous arterial studies last year that were fairly normal. I continue to think that these are pressure areas. We have been using silver alginate on all the wounds 3/30; 3-week follow-up. The left lateral tibia is still open but with less width. Left dorsal foot small punched out wound. He has a large area on his right lateral calcaneus and an area that I did not pick up on last time on the tip of the left calcaneus As well he has a new area on the right lateral calf which was new today. 4/13; this is a patient with multiple medical problems. He has been in this clinic for a very prolonged period of time with different wounds in different areas in his lower extremities. Currently we have the left lateral tibia, left heel, left dorsal foot, right lateral heel and right lateral lower tibia. We have been using Santyl on the right and silver alginate on the left He has his daughter who changes his dressings. The reason behind the nonhealing here and ulcer recurrence is really never been clear to me. The patient is debilitated. He uses bunny boots apparently at night to protect his heels and feet. His arterial studies have previously not suggested a primary arterial etiology 4/27 2-week follow-up. He has a new wound on the right anterior patella currently trauma against the wheelchair. Failing this had he has areas on the right lateral calf and right lateral calcaneus both of which we have been using Santyl. On the left side one of the original wounds on the left lateral tibia area, left calcaneus, punched out areas on the left dorsal foot. On the left we have been using PolyMem Ag 5/11; 2-week follow-up. He does not have any new wounds this week and most of his wounds look better. Using Santyl on the right lateral heel and right lateral ankle and  polymen Ag and everything else under compression. 09/15/19-Patient at 2-week follow-up has to the wounds about the same and the rest of the wounds smaller in dimensions, the left posterior wound is about the same, one of the wounds is healed on the right knee. Electronic Signature(s) Signed: 09/15/2019 11:09:31 AM By: [name] [name] Entered By: [name] Bastos on 09/15/2019 11:09:31 -------------------------------------------------------------------------------- Physical Exam Details Patient Name: Date of Service: [name] Foley F. 09/15/2019 9:30 A M Medical Record Number: [id number] Patient Account Number: [id number] Date of Birth/Sex: Treating RN: 1945/06/15 (74 y.o. [name] [name] Primary Care Provider: [name], [name] Other Clinician: Referring Provider: Treating Provider/Extender: Zaylah Blecha [name] [name] Weeks in Treatment: 178 Constitutional alert and oriented x 3. sitting or standing blood pressure is within target range for patient.. supine blood pressure is within target range for patient.. pulse regular and within target range for patient.Marland Kitchen respirations regular, non-labored and within target range for patient.Marland Kitchen temperature within target range for patient.. . . Well- nourished and well-hydrated in no acute distress. Notes Left posterior wound has healthy granulation tissue at base, surrounding skin otherwise intact there is second wound proximal to the main wound also with healthy granulation tissue The right lower distal anterior leg wound is  small and has a surface slough layer that I try to remove using a curette no bleeding was noted Electronic Signature(s) Signed: 09/15/2019 11:10:15 AM By: [name] [name] Entered By: [name] Bastos on 09/15/2019 11:10:15 -------------------------------------------------------------------------------- Physician Orders Details Patient Name: Date of Service: [name] Foley F. 09/15/2019 9:30 A M Medical Record  Number: [id number] Patient Account Number: [id number] Date of Birth/Sex: Treating RN: [birth year] (74 y.o. 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 Dressing Change Frequency Wound #14 Left,Lateral Lower Leg Change dressing three times week. Wound #18 Left,Proximal,Dorsal Foot Change dressing three times week. Wound #23 Right,Lateral Calcaneus Change Dressing every other day. Wound #24 Left Calcaneus Change dressing three times week. Wound #25 Right,Lateral Lower Leg Change Dressing every other day. Wound #26 Left,Distal Foot Change dressing three times week. Skin Barriers/Peri-Wound Care Other: - TCA to left lower extrimity Wound Cleansing Clean wound with Normal Saline. May shower with protection. Primary Wound Dressing Wound #14 Left,Lateral Lower Leg Polymem Silver Wound #18 Left,Proximal,Dorsal Foot Polymem Silver Wound #23 Right,Lateral Calcaneus Polymem Silver Wound #24 Left Calcaneus Polymem Silver Wound #25 Right,Lateral Lower Leg Polymem Silver Wound #26 Left,Distal Foot Polymem Silver Secondary Dressing Dry Gauze Other: - apply a triple antibiotic ointment and bandaid to left elbow skin tear. Edema Control Kerlix and Coban - Bilateral -  use cotton roll not kerlix Avoid standing for long periods of time Elevate legs to the level of the heart or above for 30 minutes daily and/or when sitting, a frequency of: - throughout the day Off-Loading Multipodus Splint to: - patient to wear bunny boots to both feet while resting in chair and bed. Electronic Signature(s) Signed: 09/15/2019 5:17:39 PM By: [name] Coria RN Signed: 09/17/2019 4:57:23 PM By: [name] [name] Entered By: [name] [name] on 09/15/2019 11:09:47 -------------------------------------------------------------------------------- Problem List Details Patient Name: Date of Service: [name] Foley F. 09/15/2019 9:30 A M Medical Record Number: [id number] Patient Account Number: [id number] Date of Birth/Sex: Treating RN: November 30, 1945 (74 y.o. 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 07/01/2018 No Yes L97.521 Non-pressure chronic ulcer of other part of left foot limited to breakdown of 02/17/2019 No Yes skin L89.620 Pressure ulcer of left heel, unstageable 07/21/2019 No Yes L97.211 Non-pressure chronic ulcer of right calf limited to breakdown of skin 07/21/2019 No Yes S80.211D Abrasion, right knee, subsequent encounter 08/18/2019 No Yes Inactive Problems ICD-10 Code Description Active Date Inactive Date E11.621 Type 2 diabetes mellitus with foot ulcer 04/17/2016 04/17/2016 L03.032 Cellulitis of left toe 01/06/2019 01/06/2019 L89.623 Pressure  dorsal foot/ankle and an area on the right lateral calcaneus. The right lateral calcaneus is better as is the left dorsal foot/ankle. The area on the left lateral tibia however is larger. 3/9; 1 month follow-up. The  left lateral tibia looks mostly the same. Left dorsal foot and ankle improved and the area on the right lateral calcaneus is quite a bit worse. He has had previous arterial studies last year that were fairly normal. I continue to think that these are pressure areas. We have been using silver alginate on all the wounds 3/30; 3-week follow-up. The left lateral tibia is still open but with less width. Left dorsal foot small punched out wound. He has a large area on his right lateral calcaneus and an area that I did not pick up on last time on the tip of the left calcaneus As well he has a new area on the right lateral calf which was new today. 4/13; this is a patient with multiple medical problems. He has been in this clinic for a very prolonged period of time with different wounds in different areas in his lower extremities. Currently we have the left lateral tibia, left heel, left dorsal foot, right lateral heel and right lateral lower tibia. We have been using Santyl on the right and silver alginate on the left He has his daughter who changes his dressings. The reason behind the nonhealing here and ulcer recurrence is really never been clear to me. The patient is debilitated. He uses bunny boots apparently at night to protect his heels and feet. His arterial studies have previously not suggested a primary arterial etiology 4/27 2-week follow-up. He has a new wound on the right anterior patella currently trauma against the wheelchair. Failing this had he has areas on the right lateral calf and right lateral calcaneus both of which we have been using Santyl. On the left side one of the original wounds on the left lateral tibia area, left calcaneus, punched out areas on the left dorsal foot. On the left we have been using PolyMem Ag 5/11; 2-week follow-up. He does not have any new wounds this week and most of his wounds look better. Using Santyl on the right lateral heel and right lateral ankle and  polymen Ag and everything else under compression. 09/15/19-Patient at 2-week follow-up has to the wounds about the same and the rest of the wounds smaller in dimensions, the left posterior wound is about the same, one of the wounds is healed on the right knee. Electronic Signature(s) Signed: 09/15/2019 11:09:31 AM By: [name] [name] Entered By: [name] Bastos on 09/15/2019 11:09:31 -------------------------------------------------------------------------------- Physical Exam Details Patient Name: Date of Service: [name] Foley F. 09/15/2019 9:30 A M Medical Record Number: [id number] Patient Account Number: [id number] Date of Birth/Sex: Treating RN: 1945/06/15 (74 y.o. [name] [name] Primary Care Provider: [name], [name] Other Clinician: Referring Provider: Treating Provider/Extender: Zaylah Blecha [name] [name] Weeks in Treatment: 178 Constitutional alert and oriented x 3. sitting or standing blood pressure is within target range for patient.. supine blood pressure is within target range for patient.. pulse regular and within target range for patient.Marland Kitchen respirations regular, non-labored and within target range for patient.Marland Kitchen temperature within target range for patient.. . . Well- nourished and well-hydrated in no acute distress. Notes Left posterior wound has healthy granulation tissue at base, surrounding skin otherwise intact there is second wound proximal to the main wound also with healthy granulation tissue The right lower distal anterior leg wound is  Excisional Skin/Subcutaneous Tissue Debridement with a total area of 0.6 sq cm performed by [name] Bastos, MD. With the following instrument(s): Curette to remove Viable and Non-Viable tissue/material. Material removed includes Subcutaneous Tissue, Slough, Skin: Dermis, and Skin: Epidermis after achieving pain control using Lidocaine 5% topical ointment. No specimens were taken. A time out was conducted at 11:07, prior to the start of the procedure. A Moderate amount of bleeding was controlled with Pressure. The procedure was tolerated well with a pain level of 0 throughout and a pain level of 0 following the procedure. Post Debridement Measurements: 1cm length x 0.6cm width x 0.1cm depth; 0.047cm^3 volume. Character of Wound/Ulcer Post Debridement is improved. Severity of Tissue Post Debridement is: Fat layer exposed. Post  procedure Diagnosis Wound #26: Same as Pre-Procedure Plan Follow-up Appointments: Return Appointment in 2 weeks. Dressing Change Frequency: Wound #14 Left,Lateral Lower Leg: Change dressing three times week. Wound #18 Left,Proximal,Dorsal Foot: Change dressing three times week. Wound #23 Right,Lateral Calcaneus: Change Dressing every other day. Wound #24 Left Calcaneus: Change dressing three times week. Wound #25 Right,Lateral Lower Leg: Change Dressing every other day. Wound #26 Left,Distal Foot: Change dressing three times week. Skin Barriers/Peri-Wound Care: Other: - TCA to left lower extrimity Wound Cleansing: Clean wound with Normal Saline. May shower with protection. Primary Wound Dressing: Wound #14 Left,Lateral Lower Leg: Polymem Silver Wound #18 Left,Proximal,Dorsal Foot: Polymem Silver Wound #23 Right,Lateral Calcaneus: Polymem Silver Wound #24 Left Calcaneus: Polymem Silver Wound #25 Right,Lateral Lower Leg: Polymem Silver Wound #26 Left,Distal Foot: Polymem Silver Secondary Dressing: Dry Gauze Other: - apply a triple antibiotic ointment and bandaid to left elbow skin tear. Edema Control: Kerlix and Coban - Bilateral - use cotton roll not kerlix Avoid standing for long periods of time Elevate legs to the level of the heart or above for 30 minutes daily and/or when sitting, a frequency of: - throughout the day Off-Loading: Multipodus Splint to: - patient to wear bunny boots to both feet while resting in chair and bed. -At this point use PolyMem to all the wounds as planned -Continue 2 layer compression mild -Return to clinic in 2 weeks Electronic Signature(s) Signed: 09/15/2019 11:11:24 AM By: [name] [name] Entered By: [name] Bastos on 09/15/2019 11:11:24 -------------------------------------------------------------------------------- SuperBill Details Patient Name: Date of Service: [name] Foley F. 09/15/2019 Medical Record Number:  [id number] Patient Account Number: [id number] Date of Birth/Sex: Treating RN: 05-24-1945 (74 y.o. 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 L97.521 Non-pressure chronic ulcer of other part of left foot limited to breakdown of skin L89.620 Pressure ulcer of left heel, unstageable L97.211 Non-pressure chronic ulcer of right calf limited to breakdown of skin S80.211D Abrasion, right knee, subsequent encounter Facility Procedures CPT4 Code: [id number] Description: 06269 - DEB SUBQ TISSUE 20 SQ CM/< ICD-10 Diagnosis Description L97.221 Non-pressure chronic ulcer of left calf limited to breakdown of skin Modifier: Quantity: 1 Physician Procedures : CPT4 Code Description Modifier [id number] 11042 - WC PHYS SUBQ TISS 20 SQ CM ICD-10 Diagnosis Description L97.221 Non-pressure chronic ulcer of left calf limited to breakdown of skin Quantity: 1 Electronic Signature(s) Signed: 09/15/2019 11:11:49 AM By: [name] [name] Entered By: [name] Bastos on 09/15/2019 11:11:46  dorsal foot/ankle and an area on the right lateral calcaneus. The right lateral calcaneus is better as is the left dorsal foot/ankle. The area on the left lateral tibia however is larger. 3/9; 1 month follow-up. The  left lateral tibia looks mostly the same. Left dorsal foot and ankle improved and the area on the right lateral calcaneus is quite a bit worse. He has had previous arterial studies last year that were fairly normal. I continue to think that these are pressure areas. We have been using silver alginate on all the wounds 3/30; 3-week follow-up. The left lateral tibia is still open but with less width. Left dorsal foot small punched out wound. He has a large area on his right lateral calcaneus and an area that I did not pick up on last time on the tip of the left calcaneus As well he has a new area on the right lateral calf which was new today. 4/13; this is a patient with multiple medical problems. He has been in this clinic for a very prolonged period of time with different wounds in different areas in his lower extremities. Currently we have the left lateral tibia, left heel, left dorsal foot, right lateral heel and right lateral lower tibia. We have been using Santyl on the right and silver alginate on the left He has his daughter who changes his dressings. The reason behind the nonhealing here and ulcer recurrence is really never been clear to me. The patient is debilitated. He uses bunny boots apparently at night to protect his heels and feet. His arterial studies have previously not suggested a primary arterial etiology 4/27 2-week follow-up. He has a new wound on the right anterior patella currently trauma against the wheelchair. Failing this had he has areas on the right lateral calf and right lateral calcaneus both of which we have been using Santyl. On the left side one of the original wounds on the left lateral tibia area, left calcaneus, punched out areas on the left dorsal foot. On the left we have been using PolyMem Ag 5/11; 2-week follow-up. He does not have any new wounds this week and most of his wounds look better. Using Santyl on the right lateral heel and right lateral ankle and  polymen Ag and everything else under compression. 09/15/19-Patient at 2-week follow-up has to the wounds about the same and the rest of the wounds smaller in dimensions, the left posterior wound is about the same, one of the wounds is healed on the right knee. Electronic Signature(s) Signed: 09/15/2019 11:09:31 AM By: [name] [name] Entered By: [name] Bastos on 09/15/2019 11:09:31 -------------------------------------------------------------------------------- Physical Exam Details Patient Name: Date of Service: [name] Foley F. 09/15/2019 9:30 A M Medical Record Number: [id number] Patient Account Number: [id number] Date of Birth/Sex: Treating RN: 1945/06/15 (74 y.o. [name] [name] Primary Care Provider: [name], [name] Other Clinician: Referring Provider: Treating Provider/Extender: Zaylah Blecha [name] [name] Weeks in Treatment: 178 Constitutional alert and oriented x 3. sitting or standing blood pressure is within target range for patient.. supine blood pressure is within target range for patient.. pulse regular and within target range for patient.Marland Kitchen respirations regular, non-labored and within target range for patient.Marland Kitchen temperature within target range for patient.. . . Well- nourished and well-hydrated in no acute distress. Notes Left posterior wound has healthy granulation tissue at base, surrounding skin otherwise intact there is second wound proximal to the main wound also with healthy granulation tissue The right lower distal anterior leg wound is  dorsal foot/ankle and an area on the right lateral calcaneus. The right lateral calcaneus is better as is the left dorsal foot/ankle. The area on the left lateral tibia however is larger. 3/9; 1 month follow-up. The  left lateral tibia looks mostly the same. Left dorsal foot and ankle improved and the area on the right lateral calcaneus is quite a bit worse. He has had previous arterial studies last year that were fairly normal. I continue to think that these are pressure areas. We have been using silver alginate on all the wounds 3/30; 3-week follow-up. The left lateral tibia is still open but with less width. Left dorsal foot small punched out wound. He has a large area on his right lateral calcaneus and an area that I did not pick up on last time on the tip of the left calcaneus As well he has a new area on the right lateral calf which was new today. 4/13; this is a patient with multiple medical problems. He has been in this clinic for a very prolonged period of time with different wounds in different areas in his lower extremities. Currently we have the left lateral tibia, left heel, left dorsal foot, right lateral heel and right lateral lower tibia. We have been using Santyl on the right and silver alginate on the left He has his daughter who changes his dressings. The reason behind the nonhealing here and ulcer recurrence is really never been clear to me. The patient is debilitated. He uses bunny boots apparently at night to protect his heels and feet. His arterial studies have previously not suggested a primary arterial etiology 4/27 2-week follow-up. He has a new wound on the right anterior patella currently trauma against the wheelchair. Failing this had he has areas on the right lateral calf and right lateral calcaneus both of which we have been using Santyl. On the left side one of the original wounds on the left lateral tibia area, left calcaneus, punched out areas on the left dorsal foot. On the left we have been using PolyMem Ag 5/11; 2-week follow-up. He does not have any new wounds this week and most of his wounds look better. Using Santyl on the right lateral heel and right lateral ankle and  polymen Ag and everything else under compression. 09/15/19-Patient at 2-week follow-up has to the wounds about the same and the rest of the wounds smaller in dimensions, the left posterior wound is about the same, one of the wounds is healed on the right knee. Electronic Signature(s) Signed: 09/15/2019 11:09:31 AM By: [name] [name] Entered By: [name] Bastos on 09/15/2019 11:09:31 -------------------------------------------------------------------------------- Physical Exam Details Patient Name: Date of Service: [name] Foley F. 09/15/2019 9:30 A M Medical Record Number: [id number] Patient Account Number: [id number] Date of Birth/Sex: Treating RN: 1945/06/15 (74 y.o. [name] [name] Primary Care Provider: [name], [name] Other Clinician: Referring Provider: Treating Provider/Extender: Zaylah Blecha [name] [name] Weeks in Treatment: 178 Constitutional alert and oriented x 3. sitting or standing blood pressure is within target range for patient.. supine blood pressure is within target range for patient.. pulse regular and within target range for patient.Marland Kitchen respirations regular, non-labored and within target range for patient.Marland Kitchen temperature within target range for patient.. . . Well- nourished and well-hydrated in no acute distress. Notes Left posterior wound has healthy granulation tissue at base, surrounding skin otherwise intact there is second wound proximal to the main wound also with healthy granulation tissue The right lower distal anterior leg wound is  dorsal foot/ankle and an area on the right lateral calcaneus. The right lateral calcaneus is better as is the left dorsal foot/ankle. The area on the left lateral tibia however is larger. 3/9; 1 month follow-up. The  left lateral tibia looks mostly the same. Left dorsal foot and ankle improved and the area on the right lateral calcaneus is quite a bit worse. He has had previous arterial studies last year that were fairly normal. I continue to think that these are pressure areas. We have been using silver alginate on all the wounds 3/30; 3-week follow-up. The left lateral tibia is still open but with less width. Left dorsal foot small punched out wound. He has a large area on his right lateral calcaneus and an area that I did not pick up on last time on the tip of the left calcaneus As well he has a new area on the right lateral calf which was new today. 4/13; this is a patient with multiple medical problems. He has been in this clinic for a very prolonged period of time with different wounds in different areas in his lower extremities. Currently we have the left lateral tibia, left heel, left dorsal foot, right lateral heel and right lateral lower tibia. We have been using Santyl on the right and silver alginate on the left He has his daughter who changes his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

## 2019-09-24 NOTE — Progress Notes (Signed)
[name], [name] ([id number]) Visit Report for 09/15/2019 Arrival Information Details Patient Name: Date of Service: [name] [name] [facility] F. 09/15/2019 9:30 A M Medical Record Number: [id number] Patient Account Number: [id number] Date of Birth/Sex: Treating RN: Jan 12, 1946 (74 y.o. [name] [name]) [name] [name] Primary Care [name] [name]: [name] [name] Other Clinician: Referring [name] [name]: Treating [name] [name]/Extender: [name] [name] [name] [name] Weeks in Treatment: 178 Visit Information History Since Last Visit Added or deleted any medications: No Patient Arrived: Wheel Chair Any new allergies or adverse reactions: No Arrival Time: 10:04 Had a fall or experienced change in No Accompanied By: daughter activities of daily living that may affect Transfer Assistance: None risk of falls: Patient Identification Verified: Yes Signs or symptoms of abuse/neglect since last visito No Secondary Verification Process Completed: Yes Hospitalized since last visit: No Patient Requires Transmission-Based Precautions: No Implantable device outside of the clinic excluding No Patient Has Alerts: Yes cellular tissue based products placed in the center Patient Alerts: Patient on Blood Thinner since last visit: left ABI 1.0; rt ABI 1.09 Has Dressing in Place as Prescribed: Yes Pain Present Now: No Electronic Signature(s) Signed: 09/24/2019 9:13:31 AM By: [name] [name] Entered By: [name] [name] on 09/15/2019 10:04:48 -------------------------------------------------------------------------------- Encounter Discharge Information Details Patient Name: Date of Service: [name] [name] F. 09/15/2019 9:30 A M Medical Record Number: [id number] Patient Account Number: [id number] Date of Birth/Sex: Treating RN: 04-Sep-1945 (74 y.o. [name] Primary Care Braulio Kiedrowski: [name] [name] Other Clinician: Referring Mckinlee Dunk: Treating [name] [name]/Extender: [name] [name] [name] [name] Weeks in Treatment: [phone] Encounter Discharge  Information Items Post Procedure Vitals Discharge Condition: Stable Temperature (F): 97.6 Ambulatory Status: Ambulatory Pulse (bpm): 68 Discharge Destination: Home Respiratory Rate (breaths/min): 19 Transportation: Private Auto Blood Pressure (mmHg): 133/72 Accompanied By: daughter Schedule Follow-up Appointment: Yes Clinical Summary of Care: Patient Declined Electronic Signature(s) Signed: 09/17/2019 5:36:53 PM By: [name] Hurst RN, BSN Entered By: [name] [name] on 09/15/2019 12:07:29 -------------------------------------------------------------------------------- Lower Extremity Assessment Details Patient Name: Date of Service: [name] [name] F. 09/15/2019 9:30 A M Medical Record Number: [id number] Patient Account Number: [id number] Date of Birth/Sex: Treating RN: 03-25-1946 (74 y.o. [name] [name] Primary Care Myrikal Messmer: [name] [name] Other Clinician: Referring [name] [name]: Treating Anamari Galeas/Extender: [name] [name] [name] [name] Weeks in Treatment: 178 Edema Assessment Assessed: [Left: No] [Right: No] Edema: [Left: Yes] [Right: Yes] Calf Left: Right: Point of Measurement: 36 cm From Medial Instep 21.7 cm 23.6 cm Ankle Left: Right: Point of Measurement: 11 cm From Medial Instep 17.8 cm 19 cm Vascular Assessment Pulses: Dorsalis Pedis Palpable: [Left:No] [Right:No] Electronic Signature(s) Signed: 09/15/2019 5:28:01 PM By: [name] Gouty RN, BSN Entered By: [name] [name] on 09/15/2019 10:51:12 -------------------------------------------------------------------------------- Multi-Disciplinary Care Plan Details Patient Name: Date of Service: [name] [name] F. 09/15/2019 9:30 A M Medical Record Number: [id number] Patient Account Number: [id number] Date of Birth/Sex: Treating RN: 03-09-1946 (74 y.o. [name] [name] Primary Care Ashyr Hedgepath: [name] [name] Other Clinician: Referring Kamani Magnussen: Treating Salar Molden/Extender: [name] [name] [name] [name] Weeks in  Treatment: 178 Active Inactive Wound/Skin Impairment Nursing Diagnoses: Impaired tissue integrity Goals: Patient/caregiver will verbalize understanding of skin care regimen Date Initiated: 11/19/2017 Target Resolution Date: 10/05/2019 Goal Status: Active Ulcer/skin breakdown will have a volume reduction of 30% by week 4 Date Initiated: 11/19/2017 Date Inactivated: 12/02/2017 Target Resolution Date: 12/22/2017 Goal Status: Unmet Unmet Reason: multipl comorbidities Interventions: Assess patient/caregiver ability to obtain necessary supplies Assess ulceration(s) every visit Provide education on ulcer and skin care Notes: Electronic Signature(s) Signed: 09/15/2019 5:17:39 PM By: [name] Coria RN Entered By: [name] [name] on  [name], [name] ([id number]) Visit Report for 09/15/2019 Arrival Information Details Patient Name: Date of Service: [name] [name] [facility] F. 09/15/2019 9:30 A M Medical Record Number: [id number] Patient Account Number: [id number] Date of Birth/Sex: Treating RN: Jan 12, 1946 (74 y.o. [name] [name]) [name] [name] Primary Care [name] [name]: [name] [name] Other Clinician: Referring [name] [name]: Treating [name] [name]/Extender: [name] [name] [name] [name] Weeks in Treatment: 178 Visit Information History Since Last Visit Added or deleted any medications: No Patient Arrived: Wheel Chair Any new allergies or adverse reactions: No Arrival Time: 10:04 Had a fall or experienced change in No Accompanied By: daughter activities of daily living that may affect Transfer Assistance: None risk of falls: Patient Identification Verified: Yes Signs or symptoms of abuse/neglect since last visito No Secondary Verification Process Completed: Yes Hospitalized since last visit: No Patient Requires Transmission-Based Precautions: No Implantable device outside of the clinic excluding No Patient Has Alerts: Yes cellular tissue based products placed in the center Patient Alerts: Patient on Blood Thinner since last visit: left ABI 1.0; rt ABI 1.09 Has Dressing in Place as Prescribed: Yes Pain Present Now: No Electronic Signature(s) Signed: 09/24/2019 9:13:31 AM By: [name] [name] Entered By: [name] [name] on 09/15/2019 10:04:48 -------------------------------------------------------------------------------- Encounter Discharge Information Details Patient Name: Date of Service: [name] [name] F. 09/15/2019 9:30 A M Medical Record Number: [id number] Patient Account Number: [id number] Date of Birth/Sex: Treating RN: 04-Sep-1945 (74 y.o. [name] Primary Care Braulio Kiedrowski: [name] [name] Other Clinician: Referring Mckinlee Dunk: Treating [name] [name]/Extender: [name] [name] [name] [name] Weeks in Treatment: [phone] Encounter Discharge  Information Items Post Procedure Vitals Discharge Condition: Stable Temperature (F): 97.6 Ambulatory Status: Ambulatory Pulse (bpm): 68 Discharge Destination: Home Respiratory Rate (breaths/min): 19 Transportation: Private Auto Blood Pressure (mmHg): 133/72 Accompanied By: daughter Schedule Follow-up Appointment: Yes Clinical Summary of Care: Patient Declined Electronic Signature(s) Signed: 09/17/2019 5:36:53 PM By: [name] Hurst RN, BSN Entered By: [name] [name] on 09/15/2019 12:07:29 -------------------------------------------------------------------------------- Lower Extremity Assessment Details Patient Name: Date of Service: [name] [name] F. 09/15/2019 9:30 A M Medical Record Number: [id number] Patient Account Number: [id number] Date of Birth/Sex: Treating RN: 03-25-1946 (74 y.o. [name] [name] Primary Care Myrikal Messmer: [name] [name] Other Clinician: Referring [name] [name]: Treating Anamari Galeas/Extender: [name] [name] [name] [name] Weeks in Treatment: 178 Edema Assessment Assessed: [Left: No] [Right: No] Edema: [Left: Yes] [Right: Yes] Calf Left: Right: Point of Measurement: 36 cm From Medial Instep 21.7 cm 23.6 cm Ankle Left: Right: Point of Measurement: 11 cm From Medial Instep 17.8 cm 19 cm Vascular Assessment Pulses: Dorsalis Pedis Palpable: [Left:No] [Right:No] Electronic Signature(s) Signed: 09/15/2019 5:28:01 PM By: [name] Gouty RN, BSN Entered By: [name] [name] on 09/15/2019 10:51:12 -------------------------------------------------------------------------------- Multi-Disciplinary Care Plan Details Patient Name: Date of Service: [name] [name] F. 09/15/2019 9:30 A M Medical Record Number: [id number] Patient Account Number: [id number] Date of Birth/Sex: Treating RN: 03-09-1946 (74 y.o. [name] [name] Primary Care Ashyr Hedgepath: [name] [name] Other Clinician: Referring Kamani Magnussen: Treating Salar Molden/Extender: [name] [name] [name] [name] Weeks in  Treatment: 178 Active Inactive Wound/Skin Impairment Nursing Diagnoses: Impaired tissue integrity Goals: Patient/caregiver will verbalize understanding of skin care regimen Date Initiated: 11/19/2017 Target Resolution Date: 10/05/2019 Goal Status: Active Ulcer/skin breakdown will have a volume reduction of 30% by week 4 Date Initiated: 11/19/2017 Date Inactivated: 12/02/2017 Target Resolution Date: 12/22/2017 Goal Status: Unmet Unmet Reason: multipl comorbidities Interventions: Assess patient/caregiver ability to obtain necessary supplies Assess ulceration(s) every visit Provide education on ulcer and skin care Notes: Electronic Signature(s) Signed: 09/15/2019 5:17:39 PM By: [name] Coria RN Entered By: [name] [name] on  09/15/2019 09:26:50 -------------------------------------------------------------------------------- Pain Assessment Details Patient Name: Date of Service: [name] [name] [facility] F. 09/15/2019 9:30 A M Medical Record Number: [id number] Patient Account Number: [id number] Date of Birth/Sex: Treating RN: [birth year] (74 y.o. [name] [name]) [name] [name] Primary Care Jaycub Noorani: [name] [name] Other Clinician: Referring Jemarcus Dougal: Treating Draysen Weygandt/Extender: [name] [name] [name] [name] Weeks in Treatment: [id number] Active Problems Location of Pain Severity and Description of Pain Patient Has Paino No Site Locations Pain Management and Medication Current Pain Management: Electronic Signature(s) Signed: 09/15/2019 5:17:39 PM By: [name] Coria RN Signed: 09/24/2019 9:13:31 AM By: [name] [name] Entered By: [name] [name] on 09/15/2019 10:06:17 -------------------------------------------------------------------------------- Patient/Caregiver Education Details Patient Name: Date of Service: [name] 5/25/2021andnbsp9:30 A M Medical Record Number: [id number] Patient Account Number: [id number] Date of Birth/Gender: Treating RN: 12-30-1945 (74 y.o. [name] [name]) [name] [name] Primary Care  Physician: [name] [name] Other Clinician: Referring Physician: Treating Physician/Extender: [name] [name] [name] [name] Weeks in Treatment: 178 Education Assessment Education Provided To: Patient Education Topics Provided Wound/Skin Impairment: Methods: Explain/Verbal Responses: State content correctly Electronic Signature(s) Signed: 09/15/2019 5:17:39 PM By: [name] Coria RN Entered By: [name] [name] on 09/15/2019 09:27:07 -------------------------------------------------------------------------------- Wound Assessment Details Patient Name: Date of Service: [name] [name] F. 09/15/2019 9:30 A M Medical Record Number: [id number] Patient Account Number: [id number] Date of Birth/Sex: Treating RN: November 30, 1945 (74 y.o. [name] [name]) [name] [name] Primary Care Trenae Brunke: [name] [name] Other Clinician: Referring Roald Lukacs: Treating Aquarius Latouche/Extender: [name] [name] [name] [name] Weeks in Treatment: 178 Wound Status Wound Number: 14 Primary Diabetic Wound/Ulcer of the Lower Extremity Etiology: Wound Location: Left, Lateral Lower Leg Wound Open Wounding Event: Gradually Appeared Status: Date Acquired: 08/15/2017 Comorbid Anemia, Sleep Apnea, Arrhythmia, Congestive Heart Failure, Weeks Of Treatment: 108 History: Coronary Artery Disease, Hypertension, Myocardial Infarction, Clustered Wound: Yes Type II Diabetes, Gout, Neuropathy Photos Photo Uploaded By: Mikeal Hawthorne on 09/17/2019 09:14:26 Wound Measurements Length: (cm) 9 Width: (cm) 1.6 Depth: (cm) 0.2 Clustered Quantity: 2 Area: (cm) 11.31 Volume: (cm) 2.262 % Reduction in Area: -1100.6% % Reduction in Volume: -2306.4% Epithelialization: Small (1-33%) Tunneling: No Undermining: No Wound Description Classification: Grade 2 Wound Margin: Flat and Intact Exudate Amount: Medium Exudate Type: Serosanguineous Exudate Color: red, brown Foul Odor After Cleansing: No Slough/Fibrino Yes Wound Bed Granulation Amount: Large  (67-100%) Exposed Structure Granulation Quality: Red, Pink Fascia Exposed: No Necrotic Amount: Small (1-33%) Fat Layer (Subcutaneous Tissue) Exposed: Yes Necrotic Quality: Adherent Slough Tendon Exposed: No Muscle Exposed: No Joint Exposed: No Bone Exposed: No Treatment Notes Wound #14 (Left, Lateral Lower Leg) 1. Cleanse With Soap and water 2. Periwound Care TCA Ointment 3. Primary Dressing Applied Polymem Ag 4. Secondary Dressing Dry Gauze 6. Support Layer Holiday representative) Signed: 09/15/2019 5:17:39 PM By: [name] Coria RN Signed: 09/15/2019 5:28:01 PM By: [name] Gouty RN, BSN Entered By: [name] [name] on 09/15/2019 10:51:55 -------------------------------------------------------------------------------- Wound Assessment Details Patient Name: Date of Service: [name] [name] F. 09/15/2019 9:30 A M Medical Record Number: [id number] Patient Account Number: [id number] Date of Birth/Sex: Treating RN: 1946/01/13 (74 y.o. [name] [name] Primary Care Anoop Hemmer: [name] [name] Other Clinician: Referring Maham Quintin: Treating Lovis More/Extender: [name] [name] [name] [name] Weeks in Treatment: 178 Wound Status Wound Number: 18 Primary Diabetic Wound/Ulcer of the Lower Extremity Etiology: Wound Location: Left, Proximal, Dorsal Foot Wound Open Wounding Event: Gradually Appeared Status: Date Acquired: 08/26/2018 Comorbid Anemia, Sleep Apnea, Arrhythmia, Congestive Heart Failure, Weeks Of Treatment: 55 History: Coronary Artery Disease, Hypertension, Myocardial Infarction, Clustered Wound: No Type II Diabetes, Gout, Neuropathy Photos Photo Uploaded By: Ronnald Ramp,  Wound Description Classification: Category/Stage III Wound Margin: Flat and Intact Exudate Amount: Small Exudate Type: Serosanguineous Exudate Color: red, brown Foul Odor After Cleansing: No Slough/Fibrino Yes Wound Bed Granulation Amount: Medium (34-66%) Exposed Structure Granulation Quality: Pink Fat Layer (Subcutaneous Tissue) Exposed: Yes Necrotic Amount: Medium (34-66%) Necrotic Quality: Adherent Slough Treatment Notes Wound #24 (Left Calcaneus) 1. Cleanse With Soap and water 2. Periwound Care TCA Ointment 3. Primary Dressing Applied Polymem Ag 4. Secondary Dressing Dry Gauze 6. Support Layer Holiday representative) Signed: 09/15/2019 5:17:39 PM By: [name] Coria RN Signed: 09/15/2019 5:28:01 PM By: [name] Gouty RN, BSN Entered By: [name] [name] on 09/15/2019 10:55:00 -------------------------------------------------------------------------------- Wound Assessment Details Patient Name: Date of Service: [name] [name] F. 09/15/2019 9:30 A M Medical Record Number: [id number] Patient Account Number: [id number] Date of Birth/Sex: Treating RN: Apr 27, 1945 (74 y.o. [name] [name]) [name] [name] Primary Care Trygg Mantz: [name] [name] Other Clinician: Referring Osamu Olguin: Treating Fani Rotondo/Extender: [name] [name] [name] [name] Weeks in Treatment: 178 Wound Status Wound Number: 25 Primary Venous Leg Ulcer Etiology: Wound Location: Right, Lateral Lower Leg Wound Open Wounding Event: Gradually Appeared Status: Date Acquired: 07/06/2019 Comorbid Anemia, Sleep Apnea, Arrhythmia, Congestive Heart  Failure, Weeks Of Treatment: 8 History: Coronary Artery Disease, Hypertension, Myocardial Infarction, Clustered Wound: No Type II Diabetes, Gout, Neuropathy Photos Photo Uploaded By: Mikeal Hawthorne on 09/17/2019 09:11:54 Wound Measurements Length: (cm) 2.5 Width: (cm) 1.1 Depth: (cm) 0.1 Area: (cm) 2.16 Volume: (cm) 0.216 % Reduction in Area: 21% % Reduction in Volume: 20.9% Epithelialization: None Tunneling: No Wound Description Classification: Full Thickness Without Exposed Support Structu Wound Margin: Flat and Intact Exudate Amount: Medium Exudate Type: Serosanguineous Exudate Color: red, brown res Foul Odor After Cleansing: No Slough/Fibrino Yes Wound Bed Granulation Amount: Large (67-100%) Exposed Structure Granulation Quality: Red Fascia Exposed: No Necrotic Amount: Small (1-33%) Fat Layer (Subcutaneous Tissue) Exposed: Yes Necrotic Quality: Adherent Slough Tendon Exposed: No Muscle Exposed: No Joint Exposed: No Bone Exposed: No Treatment Notes Wound #25 (Right, Lateral Lower Leg) 1. Cleanse With Soap and water 2. Periwound Care TCA Ointment 3. Primary Dressing Applied Polymem Ag 4. Secondary Dressing Dry Gauze 6. Support Layer Holiday representative) Signed: 09/15/2019 5:17:39 PM By: [name] Coria RN Signed: 09/15/2019 5:28:01 PM By: [name] Gouty RN, BSN Entered By: [name] [name] on 09/15/2019 10:55:38 -------------------------------------------------------------------------------- Wound Assessment Details Patient Name: Date of Service: [name] [name] F. 09/15/2019 9:30 A M Medical Record Number: [id number] Patient Account Number: [id number] Date of Birth/Sex: Treating RN: November 08, 1945 (74 y.o. [name] [name]) [name] [name] Primary Care Cataleyah Colborn: [name] [name] Other Clinician: Referring Jonnie Kubly: Treating Jaliya Siegmann/Extender: [name] [name] [name] [name] Weeks in Treatment: 178 Wound Status Wound Number: 26 Primary Diabetic  Wound/Ulcer of the Lower Extremity Etiology: Wound Location: Left, Distal Foot Wound Open Wounding Event: Gradually Appeared Status: Date Acquired: 08/18/2019 Comorbid Anemia, Sleep Apnea, Arrhythmia, Congestive Heart Failure, Weeks Of Treatment: 4 History: Coronary Artery Disease, Hypertension, Myocardial Infarction, Clustered Wound: No Type II Diabetes, Gout, Neuropathy Photos Photo Uploaded By: Mikeal Hawthorne on 09/17/2019 09:14:27 Wound Measurements Length: (cm) 1 Width: (cm) 0.6 Depth: (cm) 0.1 Area: (cm) 0.471 Volume: (cm) 0.047 % Reduction in Area: 14.4% % Reduction in Volume: 14.5% Epithelialization: Small (1-33%) Tunneling: No Undermining: No Wound Description Classification: Grade 2 Wound Margin: Flat and Intact Exudate Amount: Small Exudate Type: Serosanguineous Exudate Color: red, brown Foul Odor After Cleansing: No Slough/Fibrino Yes Wound Bed Granulation Amount: Small (1-33%) Exposed Structure Granulation Quality: Red Fascia Exposed: No Necrotic Amount: Large (67-100%) Fat Layer (Subcutaneous Tissue) Exposed: Yes  [name], [name] ([id number]) Visit Report for 09/15/2019 Arrival Information Details Patient Name: Date of Service: [name] [name] [facility] F. 09/15/2019 9:30 A M Medical Record Number: [id number] Patient Account Number: [id number] Date of Birth/Sex: Treating RN: Jan 12, 1946 (74 y.o. [name] [name]) [name] [name] Primary Care [name] [name]: [name] [name] Other Clinician: Referring [name] [name]: Treating [name] [name]/Extender: [name] [name] [name] [name] Weeks in Treatment: 178 Visit Information History Since Last Visit Added or deleted any medications: No Patient Arrived: Wheel Chair Any new allergies or adverse reactions: No Arrival Time: 10:04 Had a fall or experienced change in No Accompanied By: daughter activities of daily living that may affect Transfer Assistance: None risk of falls: Patient Identification Verified: Yes Signs or symptoms of abuse/neglect since last visito No Secondary Verification Process Completed: Yes Hospitalized since last visit: No Patient Requires Transmission-Based Precautions: No Implantable device outside of the clinic excluding No Patient Has Alerts: Yes cellular tissue based products placed in the center Patient Alerts: Patient on Blood Thinner since last visit: left ABI 1.0; rt ABI 1.09 Has Dressing in Place as Prescribed: Yes Pain Present Now: No Electronic Signature(s) Signed: 09/24/2019 9:13:31 AM By: [name] [name] Entered By: [name] [name] on 09/15/2019 10:04:48 -------------------------------------------------------------------------------- Encounter Discharge Information Details Patient Name: Date of Service: [name] [name] F. 09/15/2019 9:30 A M Medical Record Number: [id number] Patient Account Number: [id number] Date of Birth/Sex: Treating RN: 04-Sep-1945 (74 y.o. [name] Primary Care Braulio Kiedrowski: [name] [name] Other Clinician: Referring Mckinlee Dunk: Treating [name] [name]/Extender: [name] [name] [name] [name] Weeks in Treatment: [phone] Encounter Discharge  Information Items Post Procedure Vitals Discharge Condition: Stable Temperature (F): 97.6 Ambulatory Status: Ambulatory Pulse (bpm): 68 Discharge Destination: Home Respiratory Rate (breaths/min): 19 Transportation: Private Auto Blood Pressure (mmHg): 133/72 Accompanied By: daughter Schedule Follow-up Appointment: Yes Clinical Summary of Care: Patient Declined Electronic Signature(s) Signed: 09/17/2019 5:36:53 PM By: [name] Hurst RN, BSN Entered By: [name] [name] on 09/15/2019 12:07:29 -------------------------------------------------------------------------------- Lower Extremity Assessment Details Patient Name: Date of Service: [name] [name] F. 09/15/2019 9:30 A M Medical Record Number: [id number] Patient Account Number: [id number] Date of Birth/Sex: Treating RN: 03-25-1946 (74 y.o. [name] [name] Primary Care Myrikal Messmer: [name] [name] Other Clinician: Referring [name] [name]: Treating Anamari Galeas/Extender: [name] [name] [name] [name] Weeks in Treatment: 178 Edema Assessment Assessed: [Left: No] [Right: No] Edema: [Left: Yes] [Right: Yes] Calf Left: Right: Point of Measurement: 36 cm From Medial Instep 21.7 cm 23.6 cm Ankle Left: Right: Point of Measurement: 11 cm From Medial Instep 17.8 cm 19 cm Vascular Assessment Pulses: Dorsalis Pedis Palpable: [Left:No] [Right:No] Electronic Signature(s) Signed: 09/15/2019 5:28:01 PM By: [name] Gouty RN, BSN Entered By: [name] [name] on 09/15/2019 10:51:12 -------------------------------------------------------------------------------- Multi-Disciplinary Care Plan Details Patient Name: Date of Service: [name] [name] F. 09/15/2019 9:30 A M Medical Record Number: [id number] Patient Account Number: [id number] Date of Birth/Sex: Treating RN: 03-09-1946 (74 y.o. [name] [name] Primary Care Ashyr Hedgepath: [name] [name] Other Clinician: Referring Kamani Magnussen: Treating Salar Molden/Extender: [name] [name] [name] [name] Weeks in  Treatment: 178 Active Inactive Wound/Skin Impairment Nursing Diagnoses: Impaired tissue integrity Goals: Patient/caregiver will verbalize understanding of skin care regimen Date Initiated: 11/19/2017 Target Resolution Date: 10/05/2019 Goal Status: Active Ulcer/skin breakdown will have a volume reduction of 30% by week 4 Date Initiated: 11/19/2017 Date Inactivated: 12/02/2017 Target Resolution Date: 12/22/2017 Goal Status: Unmet Unmet Reason: multipl comorbidities Interventions: Assess patient/caregiver ability to obtain necessary supplies Assess ulceration(s) every visit Provide education on ulcer and skin care Notes: Electronic Signature(s) Signed: 09/15/2019 5:17:39 PM By: [name] Coria RN Entered By: [name] [name] on

## 2019-09-28 ENCOUNTER — Other Ambulatory Visit: Payer: Self-pay | Admitting: Family Medicine

## 2019-09-29 ENCOUNTER — Encounter (HOSPITAL_BASED_OUTPATIENT_CLINIC_OR_DEPARTMENT_OTHER): Payer: Medicare HMO | Attending: Internal Medicine | Admitting: Internal Medicine

## 2019-09-29 DIAGNOSIS — E11622 Type 2 diabetes mellitus with other skin ulcer: Secondary | ICD-10-CM | POA: Diagnosis not present

## 2019-09-29 DIAGNOSIS — D649 Anemia, unspecified: Secondary | ICD-10-CM | POA: Diagnosis not present

## 2019-09-29 DIAGNOSIS — E1122 Type 2 diabetes mellitus with diabetic chronic kidney disease: Secondary | ICD-10-CM | POA: Diagnosis not present

## 2019-09-29 DIAGNOSIS — L97221 Non-pressure chronic ulcer of left calf limited to breakdown of skin: Secondary | ICD-10-CM | POA: Diagnosis not present

## 2019-09-29 DIAGNOSIS — L89613 Pressure ulcer of right heel, stage 3: Secondary | ICD-10-CM | POA: Insufficient documentation

## 2019-09-29 DIAGNOSIS — L97222 Non-pressure chronic ulcer of left calf with fat layer exposed: Secondary | ICD-10-CM | POA: Diagnosis not present

## 2019-09-29 DIAGNOSIS — S80211D Abrasion, right knee, subsequent encounter: Secondary | ICD-10-CM | POA: Diagnosis not present

## 2019-09-29 DIAGNOSIS — X58XXXD Exposure to other specified factors, subsequent encounter: Secondary | ICD-10-CM | POA: Diagnosis not present

## 2019-09-29 DIAGNOSIS — L89620 Pressure ulcer of left heel, unstageable: Secondary | ICD-10-CM | POA: Insufficient documentation

## 2019-09-29 DIAGNOSIS — I130 Hypertensive heart and chronic kidney disease with heart failure and stage 1 through stage 4 chronic kidney disease, or unspecified chronic kidney disease: Secondary | ICD-10-CM | POA: Insufficient documentation

## 2019-09-29 DIAGNOSIS — I252 Old myocardial infarction: Secondary | ICD-10-CM | POA: Insufficient documentation

## 2019-09-29 DIAGNOSIS — L97321 Non-pressure chronic ulcer of left ankle limited to breakdown of skin: Secondary | ICD-10-CM | POA: Insufficient documentation

## 2019-09-29 DIAGNOSIS — L97211 Non-pressure chronic ulcer of right calf limited to breakdown of skin: Secondary | ICD-10-CM | POA: Insufficient documentation

## 2019-09-29 DIAGNOSIS — E11621 Type 2 diabetes mellitus with foot ulcer: Secondary | ICD-10-CM | POA: Insufficient documentation

## 2019-09-29 DIAGNOSIS — L97521 Non-pressure chronic ulcer of other part of left foot limited to breakdown of skin: Secondary | ICD-10-CM | POA: Diagnosis not present

## 2019-09-29 DIAGNOSIS — E1142 Type 2 diabetes mellitus with diabetic polyneuropathy: Secondary | ICD-10-CM | POA: Insufficient documentation

## 2019-09-29 DIAGNOSIS — L97429 Non-pressure chronic ulcer of left heel and midfoot with unspecified severity: Secondary | ICD-10-CM | POA: Diagnosis present

## 2019-09-29 DIAGNOSIS — I5042 Chronic combined systolic (congestive) and diastolic (congestive) heart failure: Secondary | ICD-10-CM | POA: Insufficient documentation

## 2019-09-29 DIAGNOSIS — N183 Chronic kidney disease, stage 3 unspecified: Secondary | ICD-10-CM | POA: Diagnosis not present

## 2019-09-29 DIAGNOSIS — M109 Gout, unspecified: Secondary | ICD-10-CM | POA: Diagnosis not present

## 2019-09-29 DIAGNOSIS — N312 Flaccid neuropathic bladder, not elsewhere classified: Secondary | ICD-10-CM | POA: Diagnosis not present

## 2019-09-29 DIAGNOSIS — I2510 Atherosclerotic heart disease of native coronary artery without angina pectoris: Secondary | ICD-10-CM | POA: Diagnosis not present

## 2019-09-29 DIAGNOSIS — G4730 Sleep apnea, unspecified: Secondary | ICD-10-CM | POA: Insufficient documentation
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
## 2019-10-01 NOTE — Progress Notes (Signed)
III Wound Margin: Flat and Intact Exudate Amount: Small Exudate Type: Serosanguineous Exudate Color: red, brown Foul Odor After Cleansing: No Slough/Fibrino Yes Wound Bed Granulation Amount: Large (67-100%) Exposed Structure Granulation Quality: Pink, Pale Fat Layer (Subcutaneous Tissue) Exposed: Yes Necrotic Amount: None Present (0%) Treatment Notes Wound #24 (Left Calcaneus) 1. Cleanse With Soap and water 2. Periwound Care TCA Cream 3. Primary Dressing Applied Polymem Ag 4. Secondary Dressing Dry Gauze 6. Support Layer Holiday representative) Signed: 09/29/2019 4:34:33 PM By: [name] RN Signed: 09/29/2019 5:24:07 PM By: Baruch Gouty RN, BSN Entered By: Baruch Gouty on 09/29/2019 11:32:27 -------------------------------------------------------------------------------- Wound Assessment Details Patient Name: Date of Service: [name] Foley F. 09/29/2019 10:15 A M Medical Record Number: [id number] Patient Account Number: [id number] Date of Birth/Sex: Treating RN: 1946-02-11 (74 y.o. [name] [name]) [name] Primary Care Mava Suares: [name], [name] Other Clinician: Referring Chamya Hunton: Treating Oluwatimileyin Vivier/Extender: [name], [name] [name], [name] Weeks in Treatment: 180 Wound Status Wound Number: 25 Primary Venous Leg Ulcer Etiology: Wound Location: Right, Lateral Lower Leg Wound Open Wounding Event: Gradually Appeared Status: Date Acquired: 07/06/2019 Comorbid Anemia, Sleep Apnea, Arrhythmia, Congestive Heart Failure, Weeks Of Treatment: 10 History: Coronary Artery Disease, Hypertension, Myocardial Infarction, Clustered Wound: No Type II Diabetes, Gout, Neuropathy Photos Photo Uploaded By: Mikeal Hawthorne on 09/30/2019 11:32:29 Wound Measurements Length: (cm) 2.2 Width: (cm) 1 Depth:  (cm) 0.1 Area: (cm) 1.728 Volume: (cm) 0.173 % Reduction in Area: 36.8% % Reduction in Volume: 36.6% Epithelialization: Small (1-33%) Tunneling: No Undermining: No Wound Description Classification: Full Thickness Without Exposed Support Structures Wound Margin: Flat and Intact Exudate Amount: Medium Exudate Type: Serosanguineous Exudate Color: red, brown Foul Odor After Cleansing: No Slough/Fibrino Yes Wound Bed Granulation Amount: Large (67-100%) Exposed Structure Granulation Quality: Red Fascia Exposed: No Necrotic Amount: Small (1-33%) Fat Layer (Subcutaneous Tissue) Exposed: Yes Necrotic Quality: Adherent Slough Tendon Exposed: No Muscle Exposed: No Joint Exposed: No Bone Exposed: No Treatment Notes Wound #25 (Right, Lateral Lower Leg) 1. Cleanse With Soap and water 2. Periwound Care TCA Cream 3. Primary Dressing Applied Polymem Ag 4. Secondary Dressing Dry Gauze 6. Support Layer Holiday representative) Signed: 09/29/2019 4:34:33 PM By: [name] RN Signed: 09/29/2019 5:24:07 PM By: Baruch Gouty RN, BSN Entered By: Baruch Gouty on 09/29/2019 11:33:09 -------------------------------------------------------------------------------- Wound Assessment Details Patient Name: Date of Service: [name] Foley F. 09/29/2019 10:15 A M Medical Record Number: [id number] Patient Account Number: [id number] Date of Birth/Sex: Treating RN: Nov 26, 1945 (74 y.o. [name] [name]) [name] Primary Care Nadya Hopwood: [name], [name] Other Clinician: Referring Dante Roudebush: Treating Mikaella Escalona/Extender: [name], [name] [name], [name] Weeks in Treatment: 180 Wound Status Wound Number: 26 Primary Diabetic Wound/Ulcer of the Lower Extremity Etiology: Wound Location: Left, Distal Foot Wound Open Wounding Event: Gradually Appeared Status: Date Acquired: 08/18/2019 Comorbid Anemia, Sleep Apnea, Arrhythmia, Congestive Heart Failure, Weeks Of Treatment: 6 History: Coronary  Artery Disease, Hypertension, Myocardial Infarction, Clustered Wound: No Type II Diabetes, Gout, Neuropathy Photos Photo Uploaded By: Mikeal Hawthorne on 09/30/2019 11:48:19 Wound Measurements Length: (cm) 1.4 Width: (cm) 0.7 Depth: (cm) 0.1 Area: (cm) 0.77 Volume: (cm) 0.077 % Reduction in Area: -40% % Reduction in Volume: -40% Epithelialization: Small (1-33%) Tunneling: No Undermining: No Wound Description Classification: Grade 2 Wound Margin: Flat and Intact Exudate Amount: Small Exudate Type: Serosanguineous Exudate Color: red, brown Foul Odor After Cleansing: No Slough/Fibrino Yes Wound Bed Granulation Amount: Small (1-33%) Exposed Structure Granulation Quality: Red Fascia Exposed: No Necrotic Amount: Large (67-100%) Fat Layer (Subcutaneous Tissue) Exposed: Yes Necrotic Quality: Adherent Slough  [name] [name], [name] [name] ([id number]) Visit Report for 09/29/2019 Arrival Information Details Patient Name: Date of Service: [name] [facility] F. 09/29/2019 10:15 A M Medical Record Number: [id number] Patient Account Number: [id number] Date of Birth/Sex: Treating RN: March 02, 1946 (74 y.o. [name] [name]) [name] Primary Care Darinda [place]: [name], [name] Other Clinician: Referring Glennon Kopko: Treating Shakaria Raphael/Extender: [name], [name] [name], [name] Weeks in Treatment: 180 Visit Information History Since Last Visit Added or deleted any medications: No Patient Arrived: Wheel Chair Any new allergies or adverse reactions: No Arrival Time: 10:55 Had a fall or experienced change in No Accompanied By: self activities of daily living that may affect Transfer Assistance: None risk of falls: Patient Identification Verified: Yes Signs or symptoms of abuse/neglect since last visito No Secondary Verification Process Completed: Yes Hospitalized since last visit: No Patient Requires Transmission-Based Precautions: No Implantable device outside of the clinic excluding No Patient Has Alerts: Yes cellular tissue based products placed in the center Patient Alerts: Patient on Blood Thinner since last visit: left ABI 1.0; rt ABI 1.09 Has Dressing in Place as Prescribed: Yes Pain Present Now: No Electronic Signature(s) Signed: 10/01/2019 4:20:07 PM By: Sandre Kitty Entered By: Sandre Kitty on 09/29/2019 10:57:00 -------------------------------------------------------------------------------- Clinic Level of Care Assessment Details Patient Name: Date of Service: [name] Grap[facility] F. 09/29/2019 10:15 A M Medical Record Number: [id number] Patient Account Number: [id number] Date of Birth/Sex: Treating RN: [birth year] (74 y.o. [name] [name]) [name] Primary Care Maleny Candy: [name], [name] Other Clinician: Referring Keily Lepp: Treating Joyice Magda/Extender: [name], [name] [name], [name] Weeks in Treatment: 180 Clinic Level of Care  Assessment Items TOOL 4 Quantity Score X- 1 0 Use when only an EandM is performed on FOLLOW-UP visit ASSESSMENTS - Nursing Assessment / Reassessment X- 1 10 Reassessment of Co-morbidities (includes updates in patient status) X- 1 5 Reassessment of Adherence to Treatment Plan ASSESSMENTS - Wound and Skin A ssessment / Reassessment []  - 0 Simple Wound Assessment / Reassessment - one wound X- 6 5 Complex Wound Assessment / Reassessment - multiple wounds []  - 0 Dermatologic / Skin Assessment (not related to wound area) ASSESSMENTS - Focused Assessment []  - 0 Circumferential Edema Measurements - multi extremities []  - 0 Nutritional Assessment / Counseling / Intervention []  - 0 Lower Extremity Assessment (monofilament, tuning fork, pulses) []  - 0 Peripheral Arterial Disease Assessment (using hand held doppler) ASSESSMENTS - Ostomy and/or Continence Assessment and Care []  - 0 Incontinence Assessment and Management []  - 0 Ostomy Care Assessment and Management (repouching, etc.) PROCESS - Coordination of Care X - Simple Patient / Family Education for ongoing care 1 15 []  - 0 Complex (extensive) Patient / Family Education for ongoing care X- 1 10 Staff obtains Programmer, systems, Records, T Results / Process Orders est []  - 0 Staff telephones HHA, Nursing Homes / Clarify orders / etc []  - 0 Routine Transfer to another Facility (non-emergent condition) []  - 0 Routine Hospital Admission (non-emergent condition) []  - 0 New Admissions / Biomedical engineer / Ordering NPWT Apligraf, etc. , []  - 0 Emergency Hospital Admission (emergent condition) X- 1 10 Simple Discharge Coordination []  - 0 Complex (extensive) Discharge Coordination PROCESS - Special Needs []  - 0 Pediatric / Minor Patient Management []  - 0 Isolation Patient Management []  - 0 Hearing / Language / Visual special needs []  - 0 Assessment of Community assistance (transportation, D/C planning, etc.) []  -  0 Additional assistance / Altered mentation []  - 0 Support Surface(s) Assessment (bed, cushion, seat, etc.) INTERVENTIONS - Wound Cleansing / Measurement []  - 0 Simple Wound  Cleansing - one wound X- 6 5 Complex Wound Cleansing - multiple wounds X- 1 5 Wound Imaging (photographs - any number of wounds) []  - 0 Wound Tracing (instead of photographs) []  - 0 Simple Wound Measurement - one wound X- 6 5 Complex Wound Measurement - multiple wounds INTERVENTIONS - Wound Dressings []  - 0 Small Wound Dressing one or multiple wounds []  - 0 Medium Wound Dressing one or multiple wounds X- 2 20 Large Wound Dressing one or multiple wounds X- 1 5 Application of Medications - topical []  - 0 Application of Medications - injection INTERVENTIONS - Miscellaneous []  - 0 External ear exam []  - 0 Specimen Collection (cultures, biopsies, blood, body fluids, etc.) []  - 0 Specimen(s) / Culture(s) sent or taken to Lab for analysis []  - 0 Patient Transfer (multiple staff / Civil Service fast streamer / Similar devices) []  - 0 Simple Staple / Suture removal (25 or less) []  - 0 Complex Staple / Suture removal (26 or more) []  - 0 Hypo / Hyperglycemic Management (close monitor of Blood Glucose) []  - 0 Ankle / Brachial Index (ABI) - do not check if billed separately X- 1 5 Vital Signs Has the patient been seen at the hospital within the last three years: Yes Total Score: 195 Level Of Care: New/Established - Level 5 Electronic Signature(s) Signed: 09/29/2019 4:34:33 PM By: [name] RN Entered By: [name] on 09/29/2019 12:07:38 -------------------------------------------------------------------------------- Encounter Discharge Information Details Patient Name: Date of Service: [name] Foley F. 09/29/2019 10:15 A M Medical Record Number: [id number] Patient Account Number: [id number] Date of Birth/Sex: Treating RN: 1945/09/09 (74 y.o. [name] Primary Care Aalaysia Liggins: [name], [name] Other  Clinician: Referring Blayke Pinera: Treating Arkin Imran/Extender: [name], [name] [name], [name] Weeks in Treatment: 180 Encounter Discharge Information Items Discharge Condition: Stable Ambulatory Status: Wheelchair Discharge Destination: Home Transportation: Private Auto Accompanied By: daughter Schedule Follow-up Appointment: Yes Clinical Summary of Care: Patient Declined Electronic Signature(s) Signed: 10/01/2019 5:18:16 PM By: Levan Hurst RN, BSN Entered By: Levan Hurst on 09/29/2019 12:19:42 -------------------------------------------------------------------------------- Lower Extremity Assessment Details Patient Name: Date of Service: [name] Foley F. 09/29/2019 10:15 A M Medical Record Number: [id number] Patient Account Number: [id number] Date of Birth/Sex: Treating RN: 1945/12/18 (74 y.o. [name] Primary Care Elijan Googe: [name], [name] Other Clinician: Referring Isobel Eisenhuth: Treating Jisella Ashenfelter/Extender: [name], [name] [name], [name] Weeks in Treatment: 180 Edema Assessment Assessed: Shirlyn Goltz: No] [Right: No] Edema: [Left: Yes] [Right: Yes] Calf Left: Right: Point of Measurement: 36 cm From Medial Instep 22.2 cm 24.5 cm Ankle Left: Right: Point of Measurement: 11 cm From Medial Instep 18.2 cm 18.7 cm Vascular Assessment Pulses: Dorsalis Pedis Palpable: [Left:Yes] [Right:No] Electronic Signature(s) Signed: 09/29/2019 5:24:07 PM By: Baruch Gouty RN, BSN Entered By: Baruch Gouty on 09/29/2019 11:28:21 -------------------------------------------------------------------------------- [place] Details Patient Name: Date of Service: [name] Foley F. 09/29/2019 10:15 A M Medical Record Number: [id number] Patient Account Number: [id number] Date of Birth/Sex: Treating RN: 1945/08/03 (74 y.o. [name] Primary Care Francis Yardley: [name], [name] Other Clinician: Referring Ellean Firman: Treating Kaidan Spengler/Extender: [name], [name] [name],  [name] Weeks in Treatment: 180 Active Inactive Wound/Skin Impairment Nursing Diagnoses: Impaired tissue integrity Goals: Patient/caregiver will verbalize understanding of skin care regimen Date Initiated: 11/19/2017 Target Resolution Date: 10/05/2019 Goal Status: Active Ulcer/skin breakdown will have a volume reduction of 30% by week 4 Date Initiated: 11/19/2017 Date Inactivated: 12/02/2017 Target Resolution Date: 12/22/2017 Goal Status: Unmet Unmet Reason: multipl comorbidities Interventions: Assess patient/caregiver ability to obtain necessary supplies Assess ulceration(s) every visit Provide education  Cleansing - one wound X- 6 5 Complex Wound Cleansing - multiple wounds X- 1 5 Wound Imaging (photographs - any number of wounds) []  - 0 Wound Tracing (instead of photographs) []  - 0 Simple Wound Measurement - one wound X- 6 5 Complex Wound Measurement - multiple wounds INTERVENTIONS - Wound Dressings []  - 0 Small Wound Dressing one or multiple wounds []  - 0 Medium Wound Dressing one or multiple wounds X- 2 20 Large Wound Dressing one or multiple wounds X- 1 5 Application of Medications - topical []  - 0 Application of Medications - injection INTERVENTIONS - Miscellaneous []  - 0 External ear exam []  - 0 Specimen Collection (cultures, biopsies, blood, body fluids, etc.) []  - 0 Specimen(s) / Culture(s) sent or taken to Lab for analysis []  - 0 Patient Transfer (multiple staff / Civil Service fast streamer / Similar devices) []  - 0 Simple Staple / Suture removal (25 or less) []  - 0 Complex Staple / Suture removal (26 or more) []  - 0 Hypo / Hyperglycemic Management (close monitor of Blood Glucose) []  - 0 Ankle / Brachial Index (ABI) - do not check if billed separately X- 1 5 Vital Signs Has the patient been seen at the hospital within the last three years: Yes Total Score: 195 Level Of Care: New/Established - Level 5 Electronic Signature(s) Signed: 09/29/2019 4:34:33 PM By: [name] RN Entered By: [name] on 09/29/2019 12:07:38 -------------------------------------------------------------------------------- Encounter Discharge Information Details Patient Name: Date of Service: [name] Foley F. 09/29/2019 10:15 A M Medical Record Number: [id number] Patient Account Number: [id number] Date of Birth/Sex: Treating RN: 1945/09/09 (74 y.o. [name] Primary Care Aalaysia Liggins: [name], [name] Other  Clinician: Referring Blayke Pinera: Treating Arkin Imran/Extender: [name], [name] [name], [name] Weeks in Treatment: 180 Encounter Discharge Information Items Discharge Condition: Stable Ambulatory Status: Wheelchair Discharge Destination: Home Transportation: Private Auto Accompanied By: daughter Schedule Follow-up Appointment: Yes Clinical Summary of Care: Patient Declined Electronic Signature(s) Signed: 10/01/2019 5:18:16 PM By: Levan Hurst RN, BSN Entered By: Levan Hurst on 09/29/2019 12:19:42 -------------------------------------------------------------------------------- Lower Extremity Assessment Details Patient Name: Date of Service: [name] Foley F. 09/29/2019 10:15 A M Medical Record Number: [id number] Patient Account Number: [id number] Date of Birth/Sex: Treating RN: 1945/12/18 (74 y.o. [name] Primary Care Elijan Googe: [name], [name] Other Clinician: Referring Isobel Eisenhuth: Treating Jisella Ashenfelter/Extender: [name], [name] [name], [name] Weeks in Treatment: 180 Edema Assessment Assessed: Shirlyn Goltz: No] [Right: No] Edema: [Left: Yes] [Right: Yes] Calf Left: Right: Point of Measurement: 36 cm From Medial Instep 22.2 cm 24.5 cm Ankle Left: Right: Point of Measurement: 11 cm From Medial Instep 18.2 cm 18.7 cm Vascular Assessment Pulses: Dorsalis Pedis Palpable: [Left:Yes] [Right:No] Electronic Signature(s) Signed: 09/29/2019 5:24:07 PM By: Baruch Gouty RN, BSN Entered By: Baruch Gouty on 09/29/2019 11:28:21 -------------------------------------------------------------------------------- [place] Details Patient Name: Date of Service: [name] Foley F. 09/29/2019 10:15 A M Medical Record Number: [id number] Patient Account Number: [id number] Date of Birth/Sex: Treating RN: 1945/08/03 (74 y.o. [name] Primary Care Francis Yardley: [name], [name] Other Clinician: Referring Ellean Firman: Treating Kaidan Spengler/Extender: [name], [name] [name],  [name] Weeks in Treatment: 180 Active Inactive Wound/Skin Impairment Nursing Diagnoses: Impaired tissue integrity Goals: Patient/caregiver will verbalize understanding of skin care regimen Date Initiated: 11/19/2017 Target Resolution Date: 10/05/2019 Goal Status: Active Ulcer/skin breakdown will have a volume reduction of 30% by week 4 Date Initiated: 11/19/2017 Date Inactivated: 12/02/2017 Target Resolution Date: 12/22/2017 Goal Status: Unmet Unmet Reason: multipl comorbidities Interventions: Assess patient/caregiver ability to obtain necessary supplies Assess ulceration(s) every visit Provide education  [name] [name], [name] [name] ([id number]) Visit Report for 09/29/2019 Arrival Information Details Patient Name: Date of Service: [name] [facility] F. 09/29/2019 10:15 A M Medical Record Number: [id number] Patient Account Number: [id number] Date of Birth/Sex: Treating RN: March 02, 1946 (74 y.o. [name] [name]) [name] Primary Care Darinda [place]: [name], [name] Other Clinician: Referring Glennon Kopko: Treating Shakaria Raphael/Extender: [name], [name] [name], [name] Weeks in Treatment: 180 Visit Information History Since Last Visit Added or deleted any medications: No Patient Arrived: Wheel Chair Any new allergies or adverse reactions: No Arrival Time: 10:55 Had a fall or experienced change in No Accompanied By: self activities of daily living that may affect Transfer Assistance: None risk of falls: Patient Identification Verified: Yes Signs or symptoms of abuse/neglect since last visito No Secondary Verification Process Completed: Yes Hospitalized since last visit: No Patient Requires Transmission-Based Precautions: No Implantable device outside of the clinic excluding No Patient Has Alerts: Yes cellular tissue based products placed in the center Patient Alerts: Patient on Blood Thinner since last visit: left ABI 1.0; rt ABI 1.09 Has Dressing in Place as Prescribed: Yes Pain Present Now: No Electronic Signature(s) Signed: 10/01/2019 4:20:07 PM By: Sandre Kitty Entered By: Sandre Kitty on 09/29/2019 10:57:00 -------------------------------------------------------------------------------- Clinic Level of Care Assessment Details Patient Name: Date of Service: [name] Grapes [facility] F. 09/29/2019 10:15 A M Medical Record Number: [id number] Patient Account Number: [id number] Date of Birth/Sex: Treating RN: [birth year] (74 y.o. [name] [name]) [name] Primary Care Maleny Candy: [name], [name] Other Clinician: Referring Keily Lepp: Treating Joyice Magda/Extender: [name], [name] [name], [name] Weeks in Treatment: 180 Clinic Level of Care  Assessment Items TOOL 4 Quantity Score X- 1 0 Use when only an EandM is performed on FOLLOW-UP visit ASSESSMENTS - Nursing Assessment / Reassessment X- 1 10 Reassessment of Co-morbidities (includes updates in patient status) X- 1 5 Reassessment of Adherence to Treatment Plan ASSESSMENTS - Wound and Skin A ssessment / Reassessment []  - 0 Simple Wound Assessment / Reassessment - one wound X- 6 5 Complex Wound Assessment / Reassessment - multiple wounds []  - 0 Dermatologic / Skin Assessment (not related to wound area) ASSESSMENTS - Focused Assessment []  - 0 Circumferential Edema Measurements - multi extremities []  - 0 Nutritional Assessment / Counseling / Intervention []  - 0 Lower Extremity Assessment (monofilament, tuning fork, pulses) []  - 0 Peripheral Arterial Disease Assessment (using hand held doppler) ASSESSMENTS - Ostomy and/or Continence Assessment and Care []  - 0 Incontinence Assessment and Management []  - 0 Ostomy Care Assessment and Management (repouching, etc.) PROCESS - Coordination of Care X - Simple Patient / Family Education for ongoing care 1 15 []  - 0 Complex (extensive) Patient / Family Education for ongoing care X- 1 10 Staff obtains Programmer, systems, Records, T Results / Process Orders est []  - 0 Staff telephones HHA, Nursing Homes / Clarify orders / etc []  - 0 Routine Transfer to another Facility (non-emergent condition) []  - 0 Routine Hospital Admission (non-emergent condition) []  - 0 New Admissions / Biomedical engineer / Ordering NPWT Apligraf, etc. , []  - 0 Emergency Hospital Admission (emergent condition) X- 1 10 Simple Discharge Coordination []  - 0 Complex (extensive) Discharge Coordination PROCESS - Special Needs []  - 0 Pediatric / Minor Patient Management []  - 0 Isolation Patient Management []  - 0 Hearing / Language / Visual special needs []  - 0 Assessment of Community assistance (transportation, D/C planning, etc.) []  -  0 Additional assistance / Altered mentation []  - 0 Support Surface(s) Assessment (bed, cushion, seat, etc.) INTERVENTIONS - Wound Cleansing / Measurement []  - 0 Simple Wound  Cleansing - one wound X- 6 5 Complex Wound Cleansing - multiple wounds X- 1 5 Wound Imaging (photographs - any number of wounds) []  - 0 Wound Tracing (instead of photographs) []  - 0 Simple Wound Measurement - one wound X- 6 5 Complex Wound Measurement - multiple wounds INTERVENTIONS - Wound Dressings []  - 0 Small Wound Dressing one or multiple wounds []  - 0 Medium Wound Dressing one or multiple wounds X- 2 20 Large Wound Dressing one or multiple wounds X- 1 5 Application of Medications - topical []  - 0 Application of Medications - injection INTERVENTIONS - Miscellaneous []  - 0 External ear exam []  - 0 Specimen Collection (cultures, biopsies, blood, body fluids, etc.) []  - 0 Specimen(s) / Culture(s) sent or taken to Lab for analysis []  - 0 Patient Transfer (multiple staff / Civil Service fast streamer / Similar devices) []  - 0 Simple Staple / Suture removal (25 or less) []  - 0 Complex Staple / Suture removal (26 or more) []  - 0 Hypo / Hyperglycemic Management (close monitor of Blood Glucose) []  - 0 Ankle / Brachial Index (ABI) - do not check if billed separately X- 1 5 Vital Signs Has the patient been seen at the hospital within the last three years: Yes Total Score: 195 Level Of Care: New/Established - Level 5 Electronic Signature(s) Signed: 09/29/2019 4:34:33 PM By: [name] RN Entered By: [name] on 09/29/2019 12:07:38 -------------------------------------------------------------------------------- Encounter Discharge Information Details Patient Name: Date of Service: [name] Foley F. 09/29/2019 10:15 A M Medical Record Number: [id number] Patient Account Number: [id number] Date of Birth/Sex: Treating RN: 1945/09/09 (74 y.o. [name] Primary Care Aalaysia Liggins: [name], [name] Other  Clinician: Referring Blayke Pinera: Treating Arkin Imran/Extender: [name], [name] [name], [name] Weeks in Treatment: 180 Encounter Discharge Information Items Discharge Condition: Stable Ambulatory Status: Wheelchair Discharge Destination: Home Transportation: Private Auto Accompanied By: daughter Schedule Follow-up Appointment: Yes Clinical Summary of Care: Patient Declined Electronic Signature(s) Signed: 10/01/2019 5:18:16 PM By: Levan Hurst RN, BSN Entered By: Levan Hurst on 09/29/2019 12:19:42 -------------------------------------------------------------------------------- Lower Extremity Assessment Details Patient Name: Date of Service: [name] Foley F. 09/29/2019 10:15 A M Medical Record Number: [id number] Patient Account Number: [id number] Date of Birth/Sex: Treating RN: 1945/12/18 (74 y.o. [name] Primary Care Elijan Googe: [name], [name] Other Clinician: Referring Isobel Eisenhuth: Treating Jisella Ashenfelter/Extender: [name], [name] [name], [name] Weeks in Treatment: 180 Edema Assessment Assessed: Shirlyn Goltz: No] [Right: No] Edema: [Left: Yes] [Right: Yes] Calf Left: Right: Point of Measurement: 36 cm From Medial Instep 22.2 cm 24.5 cm Ankle Left: Right: Point of Measurement: 11 cm From Medial Instep 18.2 cm 18.7 cm Vascular Assessment Pulses: Dorsalis Pedis Palpable: [Left:Yes] [Right:No] Electronic Signature(s) Signed: 09/29/2019 5:24:07 PM By: Baruch Gouty RN, BSN Entered By: Baruch Gouty on 09/29/2019 11:28:21 -------------------------------------------------------------------------------- [place] Details Patient Name: Date of Service: [name] Foley F. 09/29/2019 10:15 A M Medical Record Number: [id number] Patient Account Number: [id number] Date of Birth/Sex: Treating RN: 1945/08/03 (74 y.o. [name] Primary Care Francis Yardley: [name], [name] Other Clinician: Referring Ellean Firman: Treating Kaidan Spengler/Extender: [name], [name] [name],  [name] Weeks in Treatment: 180 Active Inactive Wound/Skin Impairment Nursing Diagnoses: Impaired tissue integrity Goals: Patient/caregiver will verbalize understanding of skin care regimen Date Initiated: 11/19/2017 Target Resolution Date: 10/05/2019 Goal Status: Active Ulcer/skin breakdown will have a volume reduction of 30% by week 4 Date Initiated: 11/19/2017 Date Inactivated: 12/02/2017 Target Resolution Date: 12/22/2017 Goal Status: Unmet Unmet Reason: multipl comorbidities Interventions: Assess patient/caregiver ability to obtain necessary supplies Assess ulceration(s) every visit Provide education

## 2019-10-07 ENCOUNTER — Other Ambulatory Visit: Payer: Self-pay | Admitting: Family Medicine

## 2019-10-13 ENCOUNTER — Encounter (HOSPITAL_BASED_OUTPATIENT_CLINIC_OR_DEPARTMENT_OTHER): Payer: Medicare HMO | Admitting: Internal Medicine

## 2019-10-13 DIAGNOSIS — L89620 Pressure ulcer of left heel, unstageable: Secondary | ICD-10-CM | POA: Diagnosis not present

## 2019-10-13 DIAGNOSIS — L97211 Non-pressure chronic ulcer of right calf limited to breakdown of skin: Secondary | ICD-10-CM | POA: Diagnosis not present

## 2019-10-13 DIAGNOSIS — L97522 Non-pressure chronic ulcer of other part of left foot with fat layer exposed: Secondary | ICD-10-CM | POA: Diagnosis not present

## 2019-10-13 DIAGNOSIS — L89613 Pressure ulcer of right heel, stage 3: Secondary | ICD-10-CM | POA: Diagnosis not present

## 2019-10-13 DIAGNOSIS — G4730 Sleep apnea, unspecified: Secondary | ICD-10-CM | POA: Diagnosis not present

## 2019-10-13 DIAGNOSIS — L97221 Non-pressure chronic ulcer of left calf limited to breakdown of skin: Secondary | ICD-10-CM | POA: Diagnosis not present

## 2019-10-13 DIAGNOSIS — L97321 Non-pressure chronic ulcer of left ankle limited to breakdown of skin: Secondary | ICD-10-CM | POA: Diagnosis not present

## 2019-10-13 DIAGNOSIS — L97521 Non-pressure chronic ulcer of other part of left foot limited to breakdown of skin: Secondary | ICD-10-CM | POA: Diagnosis not present

## 2019-10-13 DIAGNOSIS — E11622 Type 2 diabetes mellitus with other skin ulcer: Secondary | ICD-10-CM | POA: Diagnosis not present

## 2019-10-13 DIAGNOSIS — E11621 Type 2 diabetes mellitus with foot ulcer: Secondary | ICD-10-CM | POA: Diagnosis not present

## 2019-10-14 NOTE — Progress Notes (Addendum)
Skin/Subcutaneous Tissue N/A Level: N/A 0.8 N/A Debridement A (sq cm): rea N/A Curette N/A Instrument: N/A Moderate N/A Bleeding: N/A Pressure N/A Hemostasis A chieved: N/A 0 N/A Procedural Pain: N/A 0 N/A Post Procedural Pain: N/A Procedure was tolerated well N/A Debridement Treatment Response: N/A 0.8x1x0.2 N/A Post Debridement Measurements L x W x D (cm) N/A 0.126 N/A Post Debridement Volume: (cm) N/A Debridement  N/A Procedures Performed: Treatment Notes Electronic Signature(s) Signed: 10/13/2019 5:20:04 PM By: Linton Ham MD Signed: 10/14/2019 4:51:47 PM By: [name] RN Entered By: Linton Ham on 10/13/2019 12:32:35 -------------------------------------------------------------------------------- Multi-Disciplinary Care Plan Details Patient Name: Date of Service: [name] Foley F. 10/13/2019 10:15 A M Medical Record Number: [id number] Patient Account Number: [id number] Date of Birth/Sex: Treating RN: 22-Sep-1945 (74 y.o. [name] [name] Primary Care Margert Edsall: [name], [name] Other Clinician: Referring Dylan Monforte: Treating Zakyah Yanes/Extender: [name], [name] [name], [name] Weeks in Treatment: 182 Active Inactive Wound/Skin Impairment Nursing Diagnoses: Impaired tissue integrity Goals: Patient/caregiver will verbalize understanding of skin care regimen Date Initiated: 11/19/2017 Target Resolution Date: 11/04/2019 Goal Status: Active Ulcer/skin breakdown will have a volume reduction of 30% by week 4 Date Initiated: 11/19/2017 Date Inactivated: 12/02/2017 Target Resolution Date: 12/22/2017 Goal Status: Unmet Unmet Reason: multipl comorbidities Interventions: Assess patient/caregiver ability to obtain necessary supplies Assess ulceration(s) every visit Provide education on ulcer and skin care Notes: Electronic Signature(s) Signed: 10/14/2019 4:51:47 PM By: [name] RN Entered By: [name] on 10/13/2019 11:02:20 -------------------------------------------------------------------------------- Pain Assessment Details Patient Name: Date of Service: [name] [name] 10/13/2019 10:15 A M Medical Record Number: [id number] Patient Account Number: [id number] Date of Birth/Sex: Treating RN: July 09, 1945 (74 y.o. [name] [name] Primary Care Deepika Dec[place]: [name], [name] Other Clinician: Referring Shena Vinluan: Treating Crisol Muecke/Extender: [name], [name] [name], [name] Weeks in Treatment:  [phone] Active Problems Location of Pain Severity and Description of Pain Patient Has Paino No Site Locations Pain Management and Medication Current Pain Management: Electronic Signature(s) Signed: 10/13/2019 5:24:50 PM By: Levan Hurst RN, BSN Entered By: Levan Hurst on 10/13/2019 10:37:05 -------------------------------------------------------------------------------- Patient/Caregiver Education Details Patient Name: Date of Service: [name] [name] 6/22/2021andnbsp10:15 [place] Record Number: [id number] Patient Account Number: [id number] Date of Birth/Gender: Treating RN: 12-22-1945 (74 y.o. [name] [name] Primary Care Physician: [name], [name] Other Clinician: Referring Physician: Treating Physician/Extender: [name], [name] [name], [name] Weeks in Treatment: [phone] Education Assessment Education Provided To: Patient Education Topics Provided Wound/Skin Impairment: Methods: Explain/Verbal Responses: State content correctly Electronic Signature(s) Signed: 10/14/2019 4:51:47 PM By: [name] RN Entered By: [name] on 10/13/2019 11:02:41 -------------------------------------------------------------------------------- Wound Assessment Details Patient Name: Date of Service: [name] [name] 10/13/2019 10:15 A M Medical Record Number: [id number] Patient Account Number: [id number] Date of Birth/Sex: Treating RN: 1945-09-18 (74 y.o. [name] [name] Primary Care Benay Pomeroy: [name], [name] Other Clinician: Referring Aunya Lemler: Treating Siara Gorder/Extender: [name], [name] [name], [name] Weeks in Treatment: 182 Wound Status Wound Number: 14 Primary Diabetic Wound/Ulcer of the Lower Extremity Etiology: Wound Location: Left, Lateral Lower Leg Wound Open Wounding Event: Gradually Appeared Status: Date Acquired: 08/15/2017 Comorbid Anemia, Sleep Apnea, Arrhythmia, Congestive Heart Failure, Weeks Of Treatment: 112 History: Coronary Artery Disease, Hypertension,  Myocardial Infarction, Clustered Wound: Yes Type II Diabetes, Gout, Neuropathy Photos Wound Measurements Length: (cm) 9.6 Width: (cm) 1.2 Depth: (cm) 0.3 Clustered Quantity: 2 Area: (cm) 9.048 Volume: (cm) 2.714 % Reduction in Area: -860.5% % Reduction in Volume: -2787.2% Epithelialization: Small (1-33%) Tunneling: No Undermining: No Wound Description Classification: Grade 2 Wound Margin: Flat and Intact Exudate Amount: Medium  Skin/Subcutaneous Tissue N/A Level: N/A 0.8 N/A Debridement A (sq cm): rea N/A Curette N/A Instrument: N/A Moderate N/A Bleeding: N/A Pressure N/A Hemostasis A chieved: N/A 0 N/A Procedural Pain: N/A 0 N/A Post Procedural Pain: N/A Procedure was tolerated well N/A Debridement Treatment Response: N/A 0.8x1x0.2 N/A Post Debridement Measurements L x W x D (cm) N/A 0.126 N/A Post Debridement Volume: (cm) N/A Debridement  N/A Procedures Performed: Treatment Notes Electronic Signature(s) Signed: 10/13/2019 5:20:04 PM By: Linton Ham MD Signed: 10/14/2019 4:51:47 PM By: [name] RN Entered By: Linton Ham on 10/13/2019 12:32:35 -------------------------------------------------------------------------------- Multi-Disciplinary Care Plan Details Patient Name: Date of Service: [name] Foley F. 10/13/2019 10:15 A M Medical Record Number: [id number] Patient Account Number: [id number] Date of Birth/Sex: Treating RN: 22-Sep-1945 (74 y.o. [name] [name] Primary Care Margert Edsall: [name], [name] Other Clinician: Referring Dylan Monforte: Treating Zakyah Yanes/Extender: [name], [name] [name], [name] Weeks in Treatment: 182 Active Inactive Wound/Skin Impairment Nursing Diagnoses: Impaired tissue integrity Goals: Patient/caregiver will verbalize understanding of skin care regimen Date Initiated: 11/19/2017 Target Resolution Date: 11/04/2019 Goal Status: Active Ulcer/skin breakdown will have a volume reduction of 30% by week 4 Date Initiated: 11/19/2017 Date Inactivated: 12/02/2017 Target Resolution Date: 12/22/2017 Goal Status: Unmet Unmet Reason: multipl comorbidities Interventions: Assess patient/caregiver ability to obtain necessary supplies Assess ulceration(s) every visit Provide education on ulcer and skin care Notes: Electronic Signature(s) Signed: 10/14/2019 4:51:47 PM By: [name] RN Entered By: [name] on 10/13/2019 11:02:20 -------------------------------------------------------------------------------- Pain Assessment Details Patient Name: Date of Service: [name] [name] 10/13/2019 10:15 A M Medical Record Number: [id number] Patient Account Number: [id number] Date of Birth/Sex: Treating RN: July 09, 1945 (74 y.o. [name] [name] Primary Care Deepika [place]: [name], [name] Other Clinician: Referring Shena Vinluan: Treating Crisol Muecke/Extender: [name], [name] [name], [name] Weeks in Treatment:  [phone] Active Problems Location of Pain Severity and Description of Pain Patient Has Paino No Site Locations Pain Management and Medication Current Pain Management: Electronic Signature(s) Signed: 10/13/2019 5:24:50 PM By: Levan Hurst RN, BSN Entered By: Levan Hurst on 10/13/2019 10:37:05 -------------------------------------------------------------------------------- Patient/Caregiver Education Details Patient Name: Date of Service: [name] [name] 6/22/2021andnbsp10:15 [place] Record Number: [id number] Patient Account Number: [id number] Date of Birth/Gender: Treating RN: 12-22-1945 (74 y.o. [name] [name] Primary Care Physician: [name], [name] Other Clinician: Referring Physician: Treating Physician/Extender: [name], [name] [name], [name] Weeks in Treatment: [phone] Education Assessment Education Provided To: Patient Education Topics Provided Wound/Skin Impairment: Methods: Explain/Verbal Responses: State content correctly Electronic Signature(s) Signed: 10/14/2019 4:51:47 PM By: [name] RN Entered By: [name] on 10/13/2019 11:02:41 -------------------------------------------------------------------------------- Wound Assessment Details Patient Name: Date of Service: [name] [name] 10/13/2019 10:15 A M Medical Record Number: [id number] Patient Account Number: [id number] Date of Birth/Sex: Treating RN: 1945-09-18 (74 y.o. [name] [name] Primary Care Benay Pomeroy: [name], [name] Other Clinician: Referring Aunya Lemler: Treating Siara Gorder/Extender: [name], [name] [name], [name] Weeks in Treatment: 182 Wound Status Wound Number: 14 Primary Diabetic Wound/Ulcer of the Lower Extremity Etiology: Wound Location: Left, Lateral Lower Leg Wound Open Wounding Event: Gradually Appeared Status: Date Acquired: 08/15/2017 Comorbid Anemia, Sleep Apnea, Arrhythmia, Congestive Heart Failure, Weeks Of Treatment: 112 History: Coronary Artery Disease, Hypertension,  Myocardial Infarction, Clustered Wound: Yes Type II Diabetes, Gout, Neuropathy Photos Wound Measurements Length: (cm) 9.6 Width: (cm) 1.2 Depth: (cm) 0.3 Clustered Quantity: 2 Area: (cm) 9.048 Volume: (cm) 2.714 % Reduction in Area: -860.5% % Reduction in Volume: -2787.2% Epithelialization: Small (1-33%) Tunneling: No Undermining: No Wound Description Classification: Grade 2 Wound Margin: Flat and Intact Exudate Amount: Medium  Skin/Subcutaneous Tissue N/A Level: N/A 0.8 N/A Debridement A (sq cm): rea N/A Curette N/A Instrument: N/A Moderate N/A Bleeding: N/A Pressure N/A Hemostasis A chieved: N/A 0 N/A Procedural Pain: N/A 0 N/A Post Procedural Pain: N/A Procedure was tolerated well N/A Debridement Treatment Response: N/A 0.8x1x0.2 N/A Post Debridement Measurements L x W x D (cm) N/A 0.126 N/A Post Debridement Volume: (cm) N/A Debridement  N/A Procedures Performed: Treatment Notes Electronic Signature(s) Signed: 10/13/2019 5:20:04 PM By: Linton Ham MD Signed: 10/14/2019 4:51:47 PM By: [name] RN Entered By: Linton Ham on 10/13/2019 12:32:35 -------------------------------------------------------------------------------- Multi-Disciplinary Care Plan Details Patient Name: Date of Service: [name] Foley F. 10/13/2019 10:15 A M Medical Record Number: [id number] Patient Account Number: [id number] Date of Birth/Sex: Treating RN: 22-Sep-1945 (74 y.o. [name] [name] Primary Care Margert Edsall: [name], [name] Other Clinician: Referring Dylan Monforte: Treating Zakyah Yanes/Extender: [name], [name] [name], [name] Weeks in Treatment: 182 Active Inactive Wound/Skin Impairment Nursing Diagnoses: Impaired tissue integrity Goals: Patient/caregiver will verbalize understanding of skin care regimen Date Initiated: 11/19/2017 Target Resolution Date: 11/04/2019 Goal Status: Active Ulcer/skin breakdown will have a volume reduction of 30% by week 4 Date Initiated: 11/19/2017 Date Inactivated: 12/02/2017 Target Resolution Date: 12/22/2017 Goal Status: Unmet Unmet Reason: multipl comorbidities Interventions: Assess patient/caregiver ability to obtain necessary supplies Assess ulceration(s) every visit Provide education on ulcer and skin care Notes: Electronic Signature(s) Signed: 10/14/2019 4:51:47 PM By: [name] RN Entered By: [name] on 10/13/2019 11:02:20 -------------------------------------------------------------------------------- Pain Assessment Details Patient Name: Date of Service: [name] [name] 10/13/2019 10:15 A M Medical Record Number: [id number] Patient Account Number: [id number] Date of Birth/Sex: Treating RN: July 09, 1945 (74 y.o. [name] [name] Primary Care Deepika [place]: [name], [name] Other Clinician: Referring Shena Vinluan: Treating Crisol Muecke/Extender: [name], [name] [name], [name] Weeks in Treatment:  [phone] Active Problems Location of Pain Severity and Description of Pain Patient Has Paino No Site Locations Pain Management and Medication Current Pain Management: Electronic Signature(s) Signed: 10/13/2019 5:24:50 PM By: Levan Hurst RN, BSN Entered By: Levan Hurst on 10/13/2019 10:37:05 -------------------------------------------------------------------------------- Patient/Caregiver Education Details Patient Name: Date of Service: [name] [name] 6/22/2021andnbsp10:15 [place] Record Number: [id number] Patient Account Number: [id number] Date of Birth/Gender: Treating RN: 12-22-1945 (74 y.o. [name] [name] Primary Care Physician: [name], [name] Other Clinician: Referring Physician: Treating Physician/Extender: [name], [name] [name], [name] Weeks in Treatment: [phone] Education Assessment Education Provided To: Patient Education Topics Provided Wound/Skin Impairment: Methods: Explain/Verbal Responses: State content correctly Electronic Signature(s) Signed: 10/14/2019 4:51:47 PM By: [name] RN Entered By: [name] on 10/13/2019 11:02:41 -------------------------------------------------------------------------------- Wound Assessment Details Patient Name: Date of Service: [name] [name] 10/13/2019 10:15 A M Medical Record Number: [id number] Patient Account Number: [id number] Date of Birth/Sex: Treating RN: 1945-09-18 (74 y.o. [name] [name] Primary Care Benay Pomeroy: [name], [name] Other Clinician: Referring Aunya Lemler: Treating Siara Gorder/Extender: [name], [name] [name], [name] Weeks in Treatment: 182 Wound Status Wound Number: 14 Primary Diabetic Wound/Ulcer of the Lower Extremity Etiology: Wound Location: Left, Lateral Lower Leg Wound Open Wounding Event: Gradually Appeared Status: Date Acquired: 08/15/2017 Comorbid Anemia, Sleep Apnea, Arrhythmia, Congestive Heart Failure, Weeks Of Treatment: 112 History: Coronary Artery Disease, Hypertension,  Myocardial Infarction, Clustered Wound: Yes Type II Diabetes, Gout, Neuropathy Photos Wound Measurements Length: (cm) 9.6 Width: (cm) 1.2 Depth: (cm) 0.3 Clustered Quantity: 2 Area: (cm) 9.048 Volume: (cm) 2.714 % Reduction in Area: -860.5% % Reduction in Volume: -2787.2% Epithelialization: Small (1-33%) Tunneling: No Undermining: No Wound Description Classification: Grade 2 Wound Margin: Flat and Intact Exudate Amount: Medium  Moisturizing lotion TCA Cream 3. Primary Dressing Applied Polymem Ag 4. Secondary Dressing Dry Gauze Heel Cup 6. Support Layer  Applied Kerlix/Coban Notes cotton instead of Chartered certified accountant) Signed: 10/16/2019 5:12:49 PM By: Minerva Fester Signed: 10/16/2019 5:45:26 PM By: Levan Hurst RN, BSN Previous Signature: 10/13/2019 5:24:50 PM Version By: Levan Hurst RN, BSN Entered By: Minerva Fester on 10/15/2019 13:48:18 -------------------------------------------------------------------------------- [place] Details Patient Name: Date of Service: [name] Foley F. 10/13/2019 10:15 A M Medical Record Number: [id number] Patient Account Number: [id number] Date of Birth/Sex: Treating RN: 10-08-1945 (74 y.o. [name] [name] Primary Care Octavius Shin: [name], [name] Other Clinician: Referring Maribel Luis: Treating Kapena Hamme/Extender: [name], [name] [name], [name] Weeks in Treatment: 182 Vital Signs Time Taken: 10:36 Temperature (F): 98.5 Height (in): 74 Pulse (bpm): 71 Weight (lbs): 150 Respiratory Rate (breaths/min): 18 Body Mass Index (BMI): 19.3 Blood Pressure (mmHg): 137/78 Capillary Blood Glucose (mg/dl): 119 Reference Range: 80 - 120 mg / dl Notes glucose per pt report Electronic Signature(s) Signed: 10/13/2019 5:24:50 PM By: Levan Hurst RN, BSN Entered By: Levan Hurst on 10/13/2019 10:36:58  Skin/Subcutaneous Tissue N/A Level: N/A 0.8 N/A Debridement A (sq cm): rea N/A Curette N/A Instrument: N/A Moderate N/A Bleeding: N/A Pressure N/A Hemostasis A chieved: N/A 0 N/A Procedural Pain: N/A 0 N/A Post Procedural Pain: N/A Procedure was tolerated well N/A Debridement Treatment Response: N/A 0.8x1x0.2 N/A Post Debridement Measurements L x W x D (cm) N/A 0.126 N/A Post Debridement Volume: (cm) N/A Debridement  N/A Procedures Performed: Treatment Notes Electronic Signature(s) Signed: 10/13/2019 5:20:04 PM By: Linton Ham MD Signed: 10/14/2019 4:51:47 PM By: [name] RN Entered By: Linton Ham on 10/13/2019 12:32:35 -------------------------------------------------------------------------------- Multi-Disciplinary Care Plan Details Patient Name: Date of Service: [name] Foley F. 10/13/2019 10:15 A M Medical Record Number: [id number] Patient Account Number: [id number] Date of Birth/Sex: Treating RN: 22-Sep-1945 (74 y.o. [name] [name] Primary Care Margert Edsall: [name], [name] Other Clinician: Referring Dylan Monforte: Treating Zakyah Yanes/Extender: [name], [name] [name], [name] Weeks in Treatment: 182 Active Inactive Wound/Skin Impairment Nursing Diagnoses: Impaired tissue integrity Goals: Patient/caregiver will verbalize understanding of skin care regimen Date Initiated: 11/19/2017 Target Resolution Date: 11/04/2019 Goal Status: Active Ulcer/skin breakdown will have a volume reduction of 30% by week 4 Date Initiated: 11/19/2017 Date Inactivated: 12/02/2017 Target Resolution Date: 12/22/2017 Goal Status: Unmet Unmet Reason: multipl comorbidities Interventions: Assess patient/caregiver ability to obtain necessary supplies Assess ulceration(s) every visit Provide education on ulcer and skin care Notes: Electronic Signature(s) Signed: 10/14/2019 4:51:47 PM By: [name] RN Entered By: [name] on 10/13/2019 11:02:20 -------------------------------------------------------------------------------- Pain Assessment Details Patient Name: Date of Service: [name] [name] 10/13/2019 10:15 A M Medical Record Number: [id number] Patient Account Number: [id number] Date of Birth/Sex: Treating RN: July 09, 1945 (74 y.o. [name] [name] Primary Care Deepika [place]: [name], [name] Other Clinician: Referring Shena Vinluan: Treating Crisol Muecke/Extender: [name], [name] [name], [name] Weeks in Treatment:  [phone] Active Problems Location of Pain Severity and Description of Pain Patient Has Paino No Site Locations Pain Management and Medication Current Pain Management: Electronic Signature(s) Signed: 10/13/2019 5:24:50 PM By: Levan Hurst RN, BSN Entered By: Levan Hurst on 10/13/2019 10:37:05 -------------------------------------------------------------------------------- Patient/Caregiver Education Details Patient Name: Date of Service: [name] [name] 6/22/2021andnbsp10:15 [place] Record Number: [id number] Patient Account Number: [id number] Date of Birth/Gender: Treating RN: 12-22-1945 (74 y.o. [name] [name] Primary Care Physician: [name], [name] Other Clinician: Referring Physician: Treating Physician/Extender: [name], [name] [name], [name] Weeks in Treatment: [phone] Education Assessment Education Provided To: Patient Education Topics Provided Wound/Skin Impairment: Methods: Explain/Verbal Responses: State content correctly Electronic Signature(s) Signed: 10/14/2019 4:51:47 PM By: [name] RN Entered By: [name] on 10/13/2019 11:02:41 -------------------------------------------------------------------------------- Wound Assessment Details Patient Name: Date of Service: [name] [name] 10/13/2019 10:15 A M Medical Record Number: [id number] Patient Account Number: [id number] Date of Birth/Sex: Treating RN: 1945-09-18 (74 y.o. [name] [name] Primary Care Benay Pomeroy: [name], [name] Other Clinician: Referring Aunya Lemler: Treating Siara Gorder/Extender: [name], [name] [name], [name] Weeks in Treatment: 182 Wound Status Wound Number: 14 Primary Diabetic Wound/Ulcer of the Lower Extremity Etiology: Wound Location: Left, Lateral Lower Leg Wound Open Wounding Event: Gradually Appeared Status: Date Acquired: 08/15/2017 Comorbid Anemia, Sleep Apnea, Arrhythmia, Congestive Heart Failure, Weeks Of Treatment: 112 History: Coronary Artery Disease, Hypertension,  Myocardial Infarction, Clustered Wound: Yes Type II Diabetes, Gout, Neuropathy Photos Wound Measurements Length: (cm) 9.6 Width: (cm) 1.2 Depth: (cm) 0.3 Clustered Quantity: 2 Area: (cm) 9.048 Volume: (cm) 2.714 % Reduction in Area: -860.5% % Reduction in Volume: -2787.2% Epithelialization: Small (1-33%) Tunneling: No Undermining: No Wound Description Classification: Grade 2 Wound Margin: Flat and Intact Exudate Amount: Medium  Moisturizing lotion TCA Cream 3. Primary Dressing Applied Polymem Ag 4. Secondary Dressing Dry Gauze Heel Cup 6. Support Layer  Applied Kerlix/Coban Notes cotton instead of Chartered certified accountant) Signed: 10/16/2019 5:12:49 PM By: Minerva Fester Signed: 10/16/2019 5:45:26 PM By: Levan Hurst RN, BSN Previous Signature: 10/13/2019 5:24:50 PM Version By: Levan Hurst RN, BSN Entered By: Minerva Fester on 10/15/2019 13:48:18 -------------------------------------------------------------------------------- [place] Details Patient Name: Date of Service: [name] Foley F. 10/13/2019 10:15 A M Medical Record Number: [id number] Patient Account Number: [id number] Date of Birth/Sex: Treating RN: 10-08-1945 (74 y.o. [name] [name] Primary Care Octavius Shin: [name], [name] Other Clinician: Referring Maribel Luis: Treating Kapena Hamme/Extender: [name], [name] [name], [name] Weeks in Treatment: 182 Vital Signs Time Taken: 10:36 Temperature (F): 98.5 Height (in): 74 Pulse (bpm): 71 Weight (lbs): 150 Respiratory Rate (breaths/min): 18 Body Mass Index (BMI): 19.3 Blood Pressure (mmHg): 137/78 Capillary Blood Glucose (mg/dl): 119 Reference Range: 80 - 120 mg / dl Notes glucose per pt report Electronic Signature(s) Signed: 10/13/2019 5:24:50 PM By: Levan Hurst RN, BSN Entered By: Levan Hurst on 10/13/2019 10:36:58

## 2019-10-14 NOTE — Progress Notes (Signed)
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 on the right lateral calf which was new today. 4/13; this is a patient with multiple medical problems. He has been in this clinic for a very prolonged period of time with different wounds in different areas in his lower extremities. Currently we have the left lateral tibia, left heel, left dorsal foot, right lateral heel and right lateral lower tibia. We have been using Santyl on the right and silver alginate on the left He has his daughter who changes his dressings. The reason behind the nonhealing here and ulcer recurrence is really never been clear to me. The patient is debilitated. He uses bunny boots apparently at night to protect his heels and feet. His arterial studies have previously not suggested a primary arterial etiology 4/[name] 2-week follow-up. He has a new wound on the right anterior patella currently trauma against the wheelchair. Failing this had he has areas on the right lateral calf and right lateral calcaneus both of which we have been using Santyl. On the left side one of the original wounds on the left lateral tibia area, left calcaneus, punched out areas on the left dorsal foot. On the left we have been using PolyMem Ag 5/11; 2-week follow-up. He does not have any new wounds this week and most of his wounds look better. Using Santyl on the right lateral heel and right lateral ankle and  polymen Ag and everything else under compression. 09/15/19-Patient at 2-week follow-up has to the wounds about the same and the rest of the wounds smaller in dimensions, the left posterior wound is about the same, one of the wounds is healed on the right knee. 6/8; 2-week follow-up. His left lateral calf wound is now split into 2. He has an area on the left heel which is a pressure ulcer which I think is improving he also had the wrap injury areas on the crease of his left ankle/foot that are better. Still an area on the right heel that is also improved we have been using PolyMem Ag 6/22; 2-week follow-up. The left lateral calf wound measures about the same. Left heel is closed Right heel still open but looks better. Assorted wounds on the dorsal foot and ankle 1 of these requires debridement small wound with some depth. We have been using PolyMem Ag to all wound areas Electronic Signature(s) Signed: 10/13/2019 5:20:04 PM By: Linton Ham MD Entered By: Linton Ham on 10/13/2019 12:34:08 -------------------------------------------------------------------------------- Physical Exam Details Patient Name: Date of Service: [name] Foley F. 10/13/2019 10:15 A M Medical Record Number: [id number] Patient Account Number: [id number] Date of Birth/Sex: Treating RN: [name] [name], [name] [name] (74 y.o. [name] [name]) [name] Primary Care  Provider: [name], [name] Other Clinician: Referring Provider: Treating Provider/Extender: Tasmine Hipwell [name] [name] Weeks in Treatment: 182 Constitutional Sitting or standing Blood Pressure is within target range for patient.. Pulse regular and within target range for patient.Marland Kitchen Respirations regular, non-labored and within target range.. Temperature is normal and within the target range for the patient.Marland Kitchen Appears in no distress. Notes Wound exam; the left lateral leg wound not much different. Still the same length it is divided into 2 areas narrow wounds but we have not have got  this to epithelialize The area on the left heel is closed skin over bone. Areas on the left dorsal foot include a small area that I debrided today of tightly adherent necrotic debris. Hemostasis with direct pressure The right lateral heel appears smaller Electronic Signature(s) Signed: 10/13/2019 5:20:04 PM By: Linton Ham MD Entered By: Linton Ham on 10/13/2019 12:36:12 -------------------------------------------------------------------------------- Physician Orders Details Patient Name: Date of Service: [name] Foley F. 10/13/2019 10:15 A M Medical Record Number: [id number] Patient Account Number: [id number] Date of Birth/Sex: Treating RN: [birth year] (74 y.o. 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 Dressing Change Frequency Wound #14 Left,Lateral Lower Leg Change dressing three times week. Wound #23 Right,Lateral Calcaneus Change Dressing every other day. Wound #25 Right,Lateral Lower Leg Change Dressing every other day. Wound #26 Left,Distal Foot Change dressing three times week. Skin Barriers/Peri-Wound Care Other: - TCA to left lower extrimity Wound Cleansing Clean wound with Normal Saline. May shower with protection. Primary Wound  Dressing Wound #14 Left,Lateral Lower Leg Polymem Silver Wound #23 Right,Lateral Calcaneus Polymem Silver Wound #25 Right,Lateral Lower Leg Polymem Silver Wound #26 Left,Distal Foot Polymem Silver Secondary Dressing Dry Gauze Other: - apply a triple antibiotic ointment and bandaid to left elbow skin tear. Edema Control Kerlix and Coban - Bilateral - use cotton roll not kerlix Avoid standing for long periods of time Elevate legs to the level of the heart or above for 30 minutes daily and/or when sitting, a frequency of: - throughout the day Off-Loading Multipodus Splint to: - patient to wear bunny boots to both feet while resting in chair and bed. Electronic Signature(s) Signed: 10/13/2019 5:20:04 PM By: Linton Ham MD Signed: 10/14/2019 4:51:47 PM By: [name] RN Entered By: [name] on 10/13/2019 11:34:11 -------------------------------------------------------------------------------- Problem List Details Patient Name: Date of Service: [name] Foley F. 10/13/2019 10:15 A M Medical Record Number: [id number] Patient Account Number: [id number] Date of Birth/Sex: Treating RN: [birth year]-[name] [name] (74 y.o. [name] [name] Primary Care Provider: [name], [name] Other Clinician: Referring Provider: Treating Provider/Extender: Rhoderick Farrel [name] [name] Weeks in Treatment: [phone] Active Problems ICD-10 Encounter Code Description Active Date MDM Diagnosis L89.613 Pressure ulcer of right heel, stage 3 04/17/2016 No Yes L97.221 Non-pressure chronic ulcer of left calf limited to breakdown of skin 08/15/2017 No Yes L97.321 Non-pressure chronic ulcer of left ankle limited to breakdown of skin 07/01/2018 No Yes

## 2019-10-27 DIAGNOSIS — N312 Flaccid neuropathic bladder, not elsewhere classified: Secondary | ICD-10-CM | POA: Diagnosis not present

## 2019-11-03 ENCOUNTER — Encounter (HOSPITAL_BASED_OUTPATIENT_CLINIC_OR_DEPARTMENT_OTHER): Payer: Medicare HMO | Attending: Internal Medicine | Admitting: Internal Medicine

## 2019-11-03 ENCOUNTER — Other Ambulatory Visit: Payer: Self-pay

## 2019-11-03 DIAGNOSIS — G4730 Sleep apnea, unspecified: Secondary | ICD-10-CM | POA: Diagnosis not present

## 2019-11-03 DIAGNOSIS — X58XXXD Exposure to other specified factors, subsequent encounter: Secondary | ICD-10-CM | POA: Diagnosis not present

## 2019-11-03 DIAGNOSIS — Z681 Body mass index (BMI) 19 or less, adult: Secondary | ICD-10-CM | POA: Diagnosis not present

## 2019-11-03 DIAGNOSIS — E1122 Type 2 diabetes mellitus with diabetic chronic kidney disease: Secondary | ICD-10-CM | POA: Insufficient documentation

## 2019-11-03 DIAGNOSIS — L97321 Non-pressure chronic ulcer of left ankle limited to breakdown of skin: Secondary | ICD-10-CM | POA: Insufficient documentation

## 2019-11-03 DIAGNOSIS — L97222 Non-pressure chronic ulcer of left calf with fat layer exposed: Secondary | ICD-10-CM | POA: Diagnosis not present

## 2019-11-03 DIAGNOSIS — I130 Hypertensive heart and chronic kidney disease with heart failure and stage 1 through stage 4 chronic kidney disease, or unspecified chronic kidney disease: Secondary | ICD-10-CM | POA: Diagnosis not present

## 2019-11-03 DIAGNOSIS — I252 Old myocardial infarction: Secondary | ICD-10-CM | POA: Insufficient documentation

## 2019-11-03 DIAGNOSIS — E1142 Type 2 diabetes mellitus with diabetic polyneuropathy: Secondary | ICD-10-CM | POA: Diagnosis not present

## 2019-11-03 DIAGNOSIS — D649 Anemia, unspecified: Secondary | ICD-10-CM | POA: Diagnosis not present

## 2019-11-03 DIAGNOSIS — I255 Ischemic cardiomyopathy: Secondary | ICD-10-CM | POA: Insufficient documentation

## 2019-11-03 DIAGNOSIS — E11621 Type 2 diabetes mellitus with foot ulcer: Secondary | ICD-10-CM | POA: Insufficient documentation

## 2019-11-03 DIAGNOSIS — M109 Gout, unspecified: Secondary | ICD-10-CM | POA: Diagnosis not present

## 2019-11-03 DIAGNOSIS — L97221 Non-pressure chronic ulcer of left calf limited to breakdown of skin: Secondary | ICD-10-CM | POA: Insufficient documentation

## 2019-11-03 DIAGNOSIS — L89613 Pressure ulcer of right heel, stage 3: Secondary | ICD-10-CM | POA: Insufficient documentation

## 2019-11-03 DIAGNOSIS — E11622 Type 2 diabetes mellitus with other skin ulcer: Secondary | ICD-10-CM | POA: Insufficient documentation

## 2019-11-03 DIAGNOSIS — E1165 Type 2 diabetes mellitus with hyperglycemia: Secondary | ICD-10-CM | POA: Diagnosis not present

## 2019-11-03 DIAGNOSIS — I2510 Atherosclerotic heart disease of native coronary artery without angina pectoris: Secondary | ICD-10-CM | POA: Insufficient documentation

## 2019-11-03 DIAGNOSIS — I5042 Chronic combined systolic (congestive) and diastolic (congestive) heart failure: Secondary | ICD-10-CM | POA: Diagnosis not present

## 2019-11-03 DIAGNOSIS — S80211D Abrasion, right knee, subsequent encounter: Secondary | ICD-10-CM | POA: Insufficient documentation

## 2019-11-03 DIAGNOSIS — L89620 Pressure ulcer of left heel, unstageable: Secondary | ICD-10-CM | POA: Diagnosis not present

## 2019-11-03 DIAGNOSIS — L97521 Non-pressure chronic ulcer of other part of left foot limited to breakdown of skin: Secondary | ICD-10-CM | POA: Insufficient documentation

## 2019-11-03 DIAGNOSIS — L97211 Non-pressure chronic ulcer of right calf limited to breakdown of skin: Secondary | ICD-10-CM | POA: Insufficient documentation

## 2019-11-03 DIAGNOSIS — N183 Chronic kidney disease, stage 3 unspecified: Secondary | ICD-10-CM | POA: Diagnosis not present

## 2019-11-03 NOTE — Progress Notes (Addendum)
the Left,Lateral Lower Leg .Severity of Tissue Pre Debridement is: Fat layer exposed. There was a Excisional Skin/Subcutaneous Tissue Debridement with a total area of 10.3 sq cm performed by [name], MD. With the following instrument(s): Curette to remove Viable and Non-Viable tissue/material. Material removed includes Subcutaneous Tissue after achieving pain control using Lidocaine 5% topical ointment. No specimens were taken. A time out was conducted at 11:23, prior to the start of the procedure. A Moderate amount of bleeding was controlled with Pressure. The procedure was tolerated well with a pain level of 0 throughout and a pain level of 0 following the procedure. Post Debridement Measurements: 10.3cm length x 1cm width x 0.2cm depth; 1.618cm^3 volume. Character of Wound/Ulcer Post Debridement is improved. Severity of Tissue Post Debridement is: Fat layer exposed. Post procedure Diagnosis Wound #14: Same as Pre-Procedure Pre-procedure diagnosis of Wound #14 is a  Diabetic Wound/Ulcer of the Lower Extremity located on the Left,Lateral Lower Leg . There was a Double Layer Compression Therapy Procedure by [name], RN. Post procedure Diagnosis Wound #14: Same as Pre-Procedure Wound #23 Pre-procedure diagnosis of Wound #23 is a Diabetic Wound/Ulcer of the Lower Extremity located on the Right,Lateral Calcaneus . There was a Double Layer Compression Therapy Procedure by [name], RN. Post procedure Diagnosis Wound #23: Same as Pre-Procedure Wound #25 Pre-procedure diagnosis of Wound #25 is a Venous Leg Ulcer located on the Right,Lateral Lower Leg . There was a Double Layer Compression Therapy Procedure by [name], RN. Post procedure Diagnosis Wound #25: Same as Pre-Procedure Wound #26 Pre-procedure diagnosis of Wound #26 is a Diabetic Wound/Ulcer of the Lower Extremity located on the Left,Distal Foot . There was a Double Layer Compression Therapy Procedure by [name], RN. Post procedure Diagnosis Wound #26: Same as Pre-Procedure Plan Follow-up Appointments: Return appointment in 3 weeks. Dressing Change Frequency: Wound #14 Left,Lateral Lower Leg: Change dressing three times week. Change dressing three times week. Wound #23 Right,Lateral Calcaneus: Change Dressing every other day. Wound #25 Right,Lateral Lower Leg: Change Dressing every other day. Wound #26 Left,Distal Foot: Change dressing three times week. Skin Barriers/Peri-Wound Care: Other: - TCA to left lower extrimity Wound Cleansing: Clean wound with Normal Saline. May shower with protection. Primary Wound Dressing: Wound #14 Left,Lateral Lower Leg: Iodoflex Wound #23 Right,Lateral Calcaneus: Polymem Silver Wound #25 Right,Lateral Lower Leg: Polymem Silver Wound #26 Left,Distal Foot: Polymem Silver Secondary Dressing: Dry Gauze Other: - apply a triple antibiotic ointment and bandaid to left elbow skin tear. Edema Control: Kerlix and Coban - Bilateral - use  cotton roll not kerlix Avoid standing for long periods of time Elevate legs to the level of the heart or above for 30 minutes daily and/or when sitting, a frequency of: - throughout the day Off-Loading: Multipodus Splint to: - patient to wear bunny boots to both feet while resting in chair and bed. 1. TCA to the left lower extremity 2. I am going to use Iodoflex on the left lateral leg wound but continue polymen Ag everywhere else 3. The patient has a combination of venous insufficiency wounds and probably pressure ulcers. He does not have an arterial issue Electronic Signature(s) Signed: 11/03/2019 5:16:39 PM By: Linton Ham MD Entered By: Linton Ham on 11/03/2019 11:52:51 -------------------------------------------------------------------------------- SuperBill Details Patient Name: Date of Service: [name] [name] F. 11/03/2019 Medical Record Number: [id number] Patient Account Number: [id number] Date of Birth/Sex: Treating RN: 1946-03-20 (74 y.o. [name] Primary Care Provider: [name], [name] Other Clinician: Referring Provider: Treating Provider/Extender: [name] [name]  Code Description Active Date MDM Diagnosis L89.613 Pressure ulcer of right heel, stage 3 04/17/2016 No Yes L97.221 Non-pressure chronic ulcer of left calf limited to breakdown of skin 08/15/2017 No Yes L97.321 Non-pressure chronic ulcer of left ankle limited to breakdown of skin  07/01/2018 No Yes L97.521 Non-pressure chronic ulcer of other part of left foot limited to breakdown of 02/17/2019 No Yes skin L97.211 Non-pressure chronic ulcer of right calf limited to breakdown of skin 07/21/2019 No Yes Inactive Problems ICD-10 Code Description Active Date Inactive Date E11.621 Type 2 diabetes mellitus with foot ulcer 04/17/2016 04/17/2016 L03.032 Cellulitis of left toe 01/06/2019 01/06/2019 F68.127 Pressure ulcer of left heel, stage 3 12/04/2016 12/04/2016 I25.119 Atherosclerotic heart disease of native coronary artery with unspecified angina 04/17/2016 04/17/2016 pectoris L97.821 Non-pressure chronic ulcer of other part of left lower leg limited to breakdown of skin 01/06/2019 01/06/2019 S51.811D Laceration without foreign body of right forearm, subsequent encounter 10/22/2017 10/22/2017 N17.00 Acute diastolic (congestive) heart failure 04/17/2016 04/17/2016 L03.116 Cellulitis of left lower limb 12/24/2017 12/24/2017 L89.620 Pressure ulcer of left heel, unstageable 07/21/2019 07/21/2019 S80.211D Abrasion, right knee, subsequent encounter 08/18/2019 08/18/2019 Resolved Problems ICD-10 Code Description Active Date Resolved Date L89.512 Pressure ulcer of right ankle, stage 2 04/17/2016 04/17/2016 L89.522 Pressure ulcer of left ankle, stage 2 04/17/2016 04/17/2016 Electronic Signature(s) Signed: 11/03/2019 5:16:39 PM By: Linton Ham MD Entered By: Linton Ham on 11/03/2019 11:44:50 -------------------------------------------------------------------------------- Progress Note Details Patient Name: Date of Service: [name] [name] F. 11/03/2019 10:15 A M Medical Record Number: [id number] Patient Account Number: [id number] Date of Birth/Sex: Treating RN: 1945/05/21 (74 y.o. [name] Primary Care Provider: [name], [name] Other Clinician: Referring Provider: Treating Provider/Extender: [name] [name] [name], [name] Weeks in Treatment: 185 Subjective History of Present  Illness (HPI) The following HPI elements were documented for the patient's wound: Location: On the left and right lateral forefoot which has been there for about 6 months Quality: Patient reports No Pain. Severity: Patient states wound(s) are getting worse. Duration: Patient has had the wound for > 6 months prior to seeking treatment at the wound center Context: The wound would happen gradually Modifying Factors: Patient wound(s)/ulcer(s) are worsening due to :continual drainage from the wound Associated Signs and Symptoms: Patient reports having increase discharge. This patient returns after being seen here till the end of August and he was lost to follow-up. he has been quite debilitated laying in bed most of the time and his condition has deteriorated significantly. He has multiple ulcerations on the heel lateral forefoot and some of his toes. ======== Old notes: 74 year old male known to our practice when he was seen here in February and March and was lost to follow-up when he was admitted to hospital with various medical problems including coronary artery disease and a stroke. Now returns with the problem on the left forefoot where he has an ulceration and this has been there for about 6 months. 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 He has had persistent hyperglycemia given recently. Patient has a past medical history of diabetes mellitus, hypertension, combined systolic and diastolic heart failure,  the Left,Lateral Lower Leg .Severity of Tissue Pre Debridement is: Fat layer exposed. There was a Excisional Skin/Subcutaneous Tissue Debridement with a total area of 10.3 sq cm performed by [name], MD. With the following instrument(s): Curette to remove Viable and Non-Viable tissue/material. Material removed includes Subcutaneous Tissue after achieving pain control using Lidocaine 5% topical ointment. No specimens were taken. A time out was conducted at 11:23, prior to the start of the procedure. A Moderate amount of bleeding was controlled with Pressure. The procedure was tolerated well with a pain level of 0 throughout and a pain level of 0 following the procedure. Post Debridement Measurements: 10.3cm length x 1cm width x 0.2cm depth; 1.618cm^3 volume. Character of Wound/Ulcer Post Debridement is improved. Severity of Tissue Post Debridement is: Fat layer exposed. Post procedure Diagnosis Wound #14: Same as Pre-Procedure Pre-procedure diagnosis of Wound #14 is a  Diabetic Wound/Ulcer of the Lower Extremity located on the Left,Lateral Lower Leg . There was a Double Layer Compression Therapy Procedure by [name], RN. Post procedure Diagnosis Wound #14: Same as Pre-Procedure Wound #23 Pre-procedure diagnosis of Wound #23 is a Diabetic Wound/Ulcer of the Lower Extremity located on the Right,Lateral Calcaneus . There was a Double Layer Compression Therapy Procedure by [name], RN. Post procedure Diagnosis Wound #23: Same as Pre-Procedure Wound #25 Pre-procedure diagnosis of Wound #25 is a Venous Leg Ulcer located on the Right,Lateral Lower Leg . There was a Double Layer Compression Therapy Procedure by [name], RN. Post procedure Diagnosis Wound #25: Same as Pre-Procedure Wound #26 Pre-procedure diagnosis of Wound #26 is a Diabetic Wound/Ulcer of the Lower Extremity located on the Left,Distal Foot . There was a Double Layer Compression Therapy Procedure by [name], RN. Post procedure Diagnosis Wound #26: Same as Pre-Procedure Plan Follow-up Appointments: Return appointment in 3 weeks. Dressing Change Frequency: Wound #14 Left,Lateral Lower Leg: Change dressing three times week. Change dressing three times week. Wound #23 Right,Lateral Calcaneus: Change Dressing every other day. Wound #25 Right,Lateral Lower Leg: Change Dressing every other day. Wound #26 Left,Distal Foot: Change dressing three times week. Skin Barriers/Peri-Wound Care: Other: - TCA to left lower extrimity Wound Cleansing: Clean wound with Normal Saline. May shower with protection. Primary Wound Dressing: Wound #14 Left,Lateral Lower Leg: Iodoflex Wound #23 Right,Lateral Calcaneus: Polymem Silver Wound #25 Right,Lateral Lower Leg: Polymem Silver Wound #26 Left,Distal Foot: Polymem Silver Secondary Dressing: Dry Gauze Other: - apply a triple antibiotic ointment and bandaid to left elbow skin tear. Edema Control: Kerlix and Coban - Bilateral - use  cotton roll not kerlix Avoid standing for long periods of time Elevate legs to the level of the heart or above for 30 minutes daily and/or when sitting, a frequency of: - throughout the day Off-Loading: Multipodus Splint to: - patient to wear bunny boots to both feet while resting in chair and bed. 1. TCA to the left lower extremity 2. I am going to use Iodoflex on the left lateral leg wound but continue polymen Ag everywhere else 3. The patient has a combination of venous insufficiency wounds and probably pressure ulcers. He does not have an arterial issue Electronic Signature(s) Signed: 11/03/2019 5:16:39 PM By: Linton Ham MD Entered By: Linton Ham on 11/03/2019 11:52:51 -------------------------------------------------------------------------------- SuperBill Details Patient Name: Date of Service: [name] [name] F. 11/03/2019 Medical Record Number: [id number] Patient Account Number: [id number] Date of Birth/Sex: Treating RN: 1946-03-20 (74 y.o. [name] Primary Care Provider: [name], [name] Other Clinician: Referring Provider: Treating Provider/Extender: [name] [name]  the Left,Lateral Lower Leg .Severity of Tissue Pre Debridement is: Fat layer exposed. There was a Excisional Skin/Subcutaneous Tissue Debridement with a total area of 10.3 sq cm performed by [name], MD. With the following instrument(s): Curette to remove Viable and Non-Viable tissue/material. Material removed includes Subcutaneous Tissue after achieving pain control using Lidocaine 5% topical ointment. No specimens were taken. A time out was conducted at 11:23, prior to the start of the procedure. A Moderate amount of bleeding was controlled with Pressure. The procedure was tolerated well with a pain level of 0 throughout and a pain level of 0 following the procedure. Post Debridement Measurements: 10.3cm length x 1cm width x 0.2cm depth; 1.618cm^3 volume. Character of Wound/Ulcer Post Debridement is improved. Severity of Tissue Post Debridement is: Fat layer exposed. Post procedure Diagnosis Wound #14: Same as Pre-Procedure Pre-procedure diagnosis of Wound #14 is a  Diabetic Wound/Ulcer of the Lower Extremity located on the Left,Lateral Lower Leg . There was a Double Layer Compression Therapy Procedure by [name], RN. Post procedure Diagnosis Wound #14: Same as Pre-Procedure Wound #23 Pre-procedure diagnosis of Wound #23 is a Diabetic Wound/Ulcer of the Lower Extremity located on the Right,Lateral Calcaneus . There was a Double Layer Compression Therapy Procedure by [name], RN. Post procedure Diagnosis Wound #23: Same as Pre-Procedure Wound #25 Pre-procedure diagnosis of Wound #25 is a Venous Leg Ulcer located on the Right,Lateral Lower Leg . There was a Double Layer Compression Therapy Procedure by [name], RN. Post procedure Diagnosis Wound #25: Same as Pre-Procedure Wound #26 Pre-procedure diagnosis of Wound #26 is a Diabetic Wound/Ulcer of the Lower Extremity located on the Left,Distal Foot . There was a Double Layer Compression Therapy Procedure by [name], RN. Post procedure Diagnosis Wound #26: Same as Pre-Procedure Plan Follow-up Appointments: Return appointment in 3 weeks. Dressing Change Frequency: Wound #14 Left,Lateral Lower Leg: Change dressing three times week. Change dressing three times week. Wound #23 Right,Lateral Calcaneus: Change Dressing every other day. Wound #25 Right,Lateral Lower Leg: Change Dressing every other day. Wound #26 Left,Distal Foot: Change dressing three times week. Skin Barriers/Peri-Wound Care: Other: - TCA to left lower extrimity Wound Cleansing: Clean wound with Normal Saline. May shower with protection. Primary Wound Dressing: Wound #14 Left,Lateral Lower Leg: Iodoflex Wound #23 Right,Lateral Calcaneus: Polymem Silver Wound #25 Right,Lateral Lower Leg: Polymem Silver Wound #26 Left,Distal Foot: Polymem Silver Secondary Dressing: Dry Gauze Other: - apply a triple antibiotic ointment and bandaid to left elbow skin tear. Edema Control: Kerlix and Coban - Bilateral - use  cotton roll not kerlix Avoid standing for long periods of time Elevate legs to the level of the heart or above for 30 minutes daily and/or when sitting, a frequency of: - throughout the day Off-Loading: Multipodus Splint to: - patient to wear bunny boots to both feet while resting in chair and bed. 1. TCA to the left lower extremity 2. I am going to use Iodoflex on the left lateral leg wound but continue polymen Ag everywhere else 3. The patient has a combination of venous insufficiency wounds and probably pressure ulcers. He does not have an arterial issue Electronic Signature(s) Signed: 11/03/2019 5:16:39 PM By: Linton Ham MD Entered By: Linton Ham on 11/03/2019 11:52:51 -------------------------------------------------------------------------------- SuperBill Details Patient Name: Date of Service: [name] [name] F. 11/03/2019 Medical Record Number: [id number] Patient Account Number: [id number] Date of Birth/Sex: Treating RN: 1946-03-20 (74 y.o. [name] Primary Care Provider: [name], [name] Other Clinician: Referring Provider: Treating Provider/Extender: [name] [name]  [name] [name], [name] [name] ([id number]) Visit Report for 11/03/2019 Debridement Details Patient Name: Date of Service: [name] [name] [facility] F. 11/03/2019 10:15 A M Medical Record Number: [id number] Patient Account Number: [id number] Date of Birth/Sex: Treating RN: 08/28/1945 (74 y.o. [name]) [name] Primary Care Provider: [name], [name] Other Clinician: Referring Provider: Treating Provider/Extender: Cicley Ganesh [name], [name] Weeks in Treatment: 185 Debridement Performed for Assessment: Wound #14 Left,Lateral Lower Leg Performed By: Physician [name], MD Debridement Type: Debridement Severity of Tissue Pre Debridement: Fat layer exposed Level of Consciousness (Pre-procedure): Awake and Alert Pre-procedure Verification/Time Out Yes - 11:23 Taken: Start Time: 11:23 Pain Control: Lidocaine 5% topical ointment T Area Debrided (L x W): otal 10.3 (cm) x 1 (cm) = 10.3 (cm) Tissue and other material debrided: Viable, Non-Viable, Subcutaneous Level: Skin/Subcutaneous Tissue Debridement Description: Excisional Instrument: Curette Bleeding: Moderate Hemostasis Achieved: Pressure End Time: 11:25 Procedural Pain: 0 Post Procedural Pain: 0 Response to Treatment: Procedure was tolerated well Level of Consciousness (Post- Awake and Alert procedure): Post Debridement Measurements of Total Wound Length: (cm) 10.3 Width: (cm) 1 Depth: (cm) 0.2 Volume: (cm) 1.618 Character of Wound/Ulcer Post Debridement: Improved Severity of Tissue Post Debridement: Fat layer exposed Post Procedure Diagnosis Same as Pre-procedure Electronic Signature(s) Signed: 11/03/2019 5:16:39 PM By: Linton Ham MD Signed: 11/03/2019 5:57:23 PM By: [name] RN Entered By: Linton Ham on 11/03/2019 11:45:45 -------------------------------------------------------------------------------- HPI Details Patient Name: Date of Service: [name] [name] F. 11/03/2019 10:15 A M Medical Record Number:  [id number] Patient Account Number: [id number] Date of Birth/Sex: Treating RN: 30-Nov-1945 (74 y.o. [name] Primary Care Provider: [name], [name] Other Clinician: Referring Provider: Treating Provider/Extender: [name] [name] [name], [name] Weeks in Treatment: 185 History of Present Illness Location: On the left and right lateral forefoot which has been there for about 6 months Quality: Patient reports No Pain. Severity: Patient states wound(s) are getting worse. Duration: Patient has had the wound for > 6 months prior to seeking treatment at the wound center Context: The wound would happen gradually Modifying Factors: Patient wound(s)/ulcer(s) are worsening due to :continual drainage from the wound ssociated Signs and Symptoms: Patient reports having increase discharge. A HPI Description: This patient returns after being seen here till the end of August and he was lost to follow-up. he has been quite debilitated laying in bed most of the time and his condition has deteriorated significantly. He has multiple ulcerations on the heel lateral forefoot and some of his toes. ======== Old notes: 74 year old male known to our practice when he was seen here in February and March and was lost to follow-up when he was admitted to hospital with various medical problems including coronary artery disease and a stroke. Now returns with the problem on the left forefoot where he has an ulceration and this has been there for about 6 months. most recently he was in hospital between July 6 and July 16, when he was admitted and treated for acute respiratory failure is secondary to aspiration pneumonia, large non-STEMI, ischemic cardiomyopathy with systolic and diastolic congestive heart failure with ejection fraction about 15-20%, ventricular tachycardia and has been treated with amiodarone, acute on careful up at the common new acute CVA, acute chronic kidney disease stage III, anemia, uncontrolled  diabetes mellitus with last hemoglobin A1c being 12%. He has had persistent hyperglycemia given recently. Patient has a past medical history of diabetes mellitus, hypertension, combined systolic and diastolic heart failure, peripheral neuropathy, gout, cardiomyopathy with ejection fraction of about 10-15%, coronary artery disease,  the Left,Lateral Lower Leg .Severity of Tissue Pre Debridement is: Fat layer exposed. There was a Excisional Skin/Subcutaneous Tissue Debridement with a total area of 10.3 sq cm performed by [name], MD. With the following instrument(s): Curette to remove Viable and Non-Viable tissue/material. Material removed includes Subcutaneous Tissue after achieving pain control using Lidocaine 5% topical ointment. No specimens were taken. A time out was conducted at 11:23, prior to the start of the procedure. A Moderate amount of bleeding was controlled with Pressure. The procedure was tolerated well with a pain level of 0 throughout and a pain level of 0 following the procedure. Post Debridement Measurements: 10.3cm length x 1cm width x 0.2cm depth; 1.618cm^3 volume. Character of Wound/Ulcer Post Debridement is improved. Severity of Tissue Post Debridement is: Fat layer exposed. Post procedure Diagnosis Wound #14: Same as Pre-Procedure Pre-procedure diagnosis of Wound #14 is a  Diabetic Wound/Ulcer of the Lower Extremity located on the Left,Lateral Lower Leg . There was a Double Layer Compression Therapy Procedure by [name], RN. Post procedure Diagnosis Wound #14: Same as Pre-Procedure Wound #23 Pre-procedure diagnosis of Wound #23 is a Diabetic Wound/Ulcer of the Lower Extremity located on the Right,Lateral Calcaneus . There was a Double Layer Compression Therapy Procedure by [name], RN. Post procedure Diagnosis Wound #23: Same as Pre-Procedure Wound #25 Pre-procedure diagnosis of Wound #25 is a Venous Leg Ulcer located on the Right,Lateral Lower Leg . There was a Double Layer Compression Therapy Procedure by [name], RN. Post procedure Diagnosis Wound #25: Same as Pre-Procedure Wound #26 Pre-procedure diagnosis of Wound #26 is a Diabetic Wound/Ulcer of the Lower Extremity located on the Left,Distal Foot . There was a Double Layer Compression Therapy Procedure by [name], RN. Post procedure Diagnosis Wound #26: Same as Pre-Procedure Plan Follow-up Appointments: Return appointment in 3 weeks. Dressing Change Frequency: Wound #14 Left,Lateral Lower Leg: Change dressing three times week. Change dressing three times week. Wound #23 Right,Lateral Calcaneus: Change Dressing every other day. Wound #25 Right,Lateral Lower Leg: Change Dressing every other day. Wound #26 Left,Distal Foot: Change dressing three times week. Skin Barriers/Peri-Wound Care: Other: - TCA to left lower extrimity Wound Cleansing: Clean wound with Normal Saline. May shower with protection. Primary Wound Dressing: Wound #14 Left,Lateral Lower Leg: Iodoflex Wound #23 Right,Lateral Calcaneus: Polymem Silver Wound #25 Right,Lateral Lower Leg: Polymem Silver Wound #26 Left,Distal Foot: Polymem Silver Secondary Dressing: Dry Gauze Other: - apply a triple antibiotic ointment and bandaid to left elbow skin tear. Edema Control: Kerlix and Coban - Bilateral - use  cotton roll not kerlix Avoid standing for long periods of time Elevate legs to the level of the heart or above for 30 minutes daily and/or when sitting, a frequency of: - throughout the day Off-Loading: Multipodus Splint to: - patient to wear bunny boots to both feet while resting in chair and bed. 1. TCA to the left lower extremity 2. I am going to use Iodoflex on the left lateral leg wound but continue polymen Ag everywhere else 3. The patient has a combination of venous insufficiency wounds and probably pressure ulcers. He does not have an arterial issue Electronic Signature(s) Signed: 11/03/2019 5:16:39 PM By: Linton Ham MD Entered By: Linton Ham on 11/03/2019 11:52:51 -------------------------------------------------------------------------------- SuperBill Details Patient Name: Date of Service: [name] [name] F. 11/03/2019 Medical Record Number: [id number] Patient Account Number: [id number] Date of Birth/Sex: Treating RN: 1946-03-20 (74 y.o. [name] Primary Care Provider: [name], [name] Other Clinician: Referring Provider: Treating Provider/Extender: [name] [name]  Code Description Active Date MDM Diagnosis L89.613 Pressure ulcer of right heel, stage 3 04/17/2016 No Yes L97.221 Non-pressure chronic ulcer of left calf limited to breakdown of skin 08/15/2017 No Yes L97.321 Non-pressure chronic ulcer of left ankle limited to breakdown of skin  07/01/2018 No Yes L97.521 Non-pressure chronic ulcer of other part of left foot limited to breakdown of 02/17/2019 No Yes skin L97.211 Non-pressure chronic ulcer of right calf limited to breakdown of skin 07/21/2019 No Yes Inactive Problems ICD-10 Code Description Active Date Inactive Date E11.621 Type 2 diabetes mellitus with foot ulcer 04/17/2016 04/17/2016 L03.032 Cellulitis of left toe 01/06/2019 01/06/2019 F68.127 Pressure ulcer of left heel, stage 3 12/04/2016 12/04/2016 I25.119 Atherosclerotic heart disease of native coronary artery with unspecified angina 04/17/2016 04/17/2016 pectoris L97.821 Non-pressure chronic ulcer of other part of left lower leg limited to breakdown of skin 01/06/2019 01/06/2019 S51.811D Laceration without foreign body of right forearm, subsequent encounter 10/22/2017 10/22/2017 N17.00 Acute diastolic (congestive) heart failure 04/17/2016 04/17/2016 L03.116 Cellulitis of left lower limb 12/24/2017 12/24/2017 L89.620 Pressure ulcer of left heel, unstageable 07/21/2019 07/21/2019 S80.211D Abrasion, right knee, subsequent encounter 08/18/2019 08/18/2019 Resolved Problems ICD-10 Code Description Active Date Resolved Date L89.512 Pressure ulcer of right ankle, stage 2 04/17/2016 04/17/2016 L89.522 Pressure ulcer of left ankle, stage 2 04/17/2016 04/17/2016 Electronic Signature(s) Signed: 11/03/2019 5:16:39 PM By: Linton Ham MD Entered By: Linton Ham on 11/03/2019 11:44:50 -------------------------------------------------------------------------------- Progress Note Details Patient Name: Date of Service: [name] [name] F. 11/03/2019 10:15 A M Medical Record Number: [id number] Patient Account Number: [id number] Date of Birth/Sex: Treating RN: 1945/05/21 (74 y.o. [name] Primary Care Provider: [name], [name] Other Clinician: Referring Provider: Treating Provider/Extender: [name] [name] [name], [name] Weeks in Treatment: 185 Subjective History of Present  Illness (HPI) The following HPI elements were documented for the patient's wound: Location: On the left and right lateral forefoot which has been there for about 6 months Quality: Patient reports No Pain. Severity: Patient states wound(s) are getting worse. Duration: Patient has had the wound for > 6 months prior to seeking treatment at the wound center Context: The wound would happen gradually Modifying Factors: Patient wound(s)/ulcer(s) are worsening due to :continual drainage from the wound Associated Signs and Symptoms: Patient reports having increase discharge. This patient returns after being seen here till the end of August and he was lost to follow-up. he has been quite debilitated laying in bed most of the time and his condition has deteriorated significantly. He has multiple ulcerations on the heel lateral forefoot and some of his toes. ======== Old notes: 74 year old male known to our practice when he was seen here in February and March and was lost to follow-up when he was admitted to hospital with various medical problems including coronary artery disease and a stroke. Now returns with the problem on the left forefoot where he has an ulceration and this has been there for about 6 months. 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 He has had persistent hyperglycemia given recently. Patient has a past medical history of diabetes mellitus, hypertension, combined systolic and diastolic heart failure,  the Left,Lateral Lower Leg .Severity of Tissue Pre Debridement is: Fat layer exposed. There was a Excisional Skin/Subcutaneous Tissue Debridement with a total area of 10.3 sq cm performed by [name], MD. With the following instrument(s): Curette to remove Viable and Non-Viable tissue/material. Material removed includes Subcutaneous Tissue after achieving pain control using Lidocaine 5% topical ointment. No specimens were taken. A time out was conducted at 11:23, prior to the start of the procedure. A Moderate amount of bleeding was controlled with Pressure. The procedure was tolerated well with a pain level of 0 throughout and a pain level of 0 following the procedure. Post Debridement Measurements: 10.3cm length x 1cm width x 0.2cm depth; 1.618cm^3 volume. Character of Wound/Ulcer Post Debridement is improved. Severity of Tissue Post Debridement is: Fat layer exposed. Post procedure Diagnosis Wound #14: Same as Pre-Procedure Pre-procedure diagnosis of Wound #14 is a  Diabetic Wound/Ulcer of the Lower Extremity located on the Left,Lateral Lower Leg . There was a Double Layer Compression Therapy Procedure by [name], RN. Post procedure Diagnosis Wound #14: Same as Pre-Procedure Wound #23 Pre-procedure diagnosis of Wound #23 is a Diabetic Wound/Ulcer of the Lower Extremity located on the Right,Lateral Calcaneus . There was a Double Layer Compression Therapy Procedure by [name], RN. Post procedure Diagnosis Wound #23: Same as Pre-Procedure Wound #25 Pre-procedure diagnosis of Wound #25 is a Venous Leg Ulcer located on the Right,Lateral Lower Leg . There was a Double Layer Compression Therapy Procedure by [name], RN. Post procedure Diagnosis Wound #25: Same as Pre-Procedure Wound #26 Pre-procedure diagnosis of Wound #26 is a Diabetic Wound/Ulcer of the Lower Extremity located on the Left,Distal Foot . There was a Double Layer Compression Therapy Procedure by [name], RN. Post procedure Diagnosis Wound #26: Same as Pre-Procedure Plan Follow-up Appointments: Return appointment in 3 weeks. Dressing Change Frequency: Wound #14 Left,Lateral Lower Leg: Change dressing three times week. Change dressing three times week. Wound #23 Right,Lateral Calcaneus: Change Dressing every other day. Wound #25 Right,Lateral Lower Leg: Change Dressing every other day. Wound #26 Left,Distal Foot: Change dressing three times week. Skin Barriers/Peri-Wound Care: Other: - TCA to left lower extrimity Wound Cleansing: Clean wound with Normal Saline. May shower with protection. Primary Wound Dressing: Wound #14 Left,Lateral Lower Leg: Iodoflex Wound #23 Right,Lateral Calcaneus: Polymem Silver Wound #25 Right,Lateral Lower Leg: Polymem Silver Wound #26 Left,Distal Foot: Polymem Silver Secondary Dressing: Dry Gauze Other: - apply a triple antibiotic ointment and bandaid to left elbow skin tear. Edema Control: Kerlix and Coban - Bilateral - use  cotton roll not kerlix Avoid standing for long periods of time Elevate legs to the level of the heart or above for 30 minutes daily and/or when sitting, a frequency of: - throughout the day Off-Loading: Multipodus Splint to: - patient to wear bunny boots to both feet while resting in chair and bed. 1. TCA to the left lower extremity 2. I am going to use Iodoflex on the left lateral leg wound but continue polymen Ag everywhere else 3. The patient has a combination of venous insufficiency wounds and probably pressure ulcers. He does not have an arterial issue Electronic Signature(s) Signed: 11/03/2019 5:16:39 PM By: Linton Ham MD Entered By: Linton Ham on 11/03/2019 11:52:51 -------------------------------------------------------------------------------- SuperBill Details Patient Name: Date of Service: [name] [name] F. 11/03/2019 Medical Record Number: [id number] Patient Account Number: [id number] Date of Birth/Sex: Treating RN: 1946-03-20 (74 y.o. [name] Primary Care Provider: [name], [name] Other Clinician: Referring Provider: Treating Provider/Extender: [name] [name]  [name] [name], [name] [name] ([id number]) Visit Report for 11/03/2019 Debridement Details Patient Name: Date of Service: [name] [name] [facility] F. 11/03/2019 10:15 A M Medical Record Number: [id number] Patient Account Number: [id number] Date of Birth/Sex: Treating RN: 08/28/1945 (74 y.o. [name]) [name] Primary Care Provider: [name], [name] Other Clinician: Referring Provider: Treating Provider/Extender: Cicley Ganesh [name], [name] Weeks in Treatment: 185 Debridement Performed for Assessment: Wound #14 Left,Lateral Lower Leg Performed By: Physician [name], MD Debridement Type: Debridement Severity of Tissue Pre Debridement: Fat layer exposed Level of Consciousness (Pre-procedure): Awake and Alert Pre-procedure Verification/Time Out Yes - 11:23 Taken: Start Time: 11:23 Pain Control: Lidocaine 5% topical ointment T Area Debrided (L x W): otal 10.3 (cm) x 1 (cm) = 10.3 (cm) Tissue and other material debrided: Viable, Non-Viable, Subcutaneous Level: Skin/Subcutaneous Tissue Debridement Description: Excisional Instrument: Curette Bleeding: Moderate Hemostasis Achieved: Pressure End Time: 11:25 Procedural Pain: 0 Post Procedural Pain: 0 Response to Treatment: Procedure was tolerated well Level of Consciousness (Post- Awake and Alert procedure): Post Debridement Measurements of Total Wound Length: (cm) 10.3 Width: (cm) 1 Depth: (cm) 0.2 Volume: (cm) 1.618 Character of Wound/Ulcer Post Debridement: Improved Severity of Tissue Post Debridement: Fat layer exposed Post Procedure Diagnosis Same as Pre-procedure Electronic Signature(s) Signed: 11/03/2019 5:16:39 PM By: Linton Ham MD Signed: 11/03/2019 5:57:23 PM By: [name] RN Entered By: Linton Ham on 11/03/2019 11:45:45 -------------------------------------------------------------------------------- HPI Details Patient Name: Date of Service: [name] [name] F. 11/03/2019 10:15 A M Medical Record Number:  [id number] Patient Account Number: [id number] Date of Birth/Sex: Treating RN: 30-Nov-1945 (74 y.o. [name] Primary Care Provider: [name], [name] Other Clinician: Referring Provider: Treating Provider/Extender: [name] [name] [name], [name] Weeks in Treatment: 185 History of Present Illness Location: On the left and right lateral forefoot which has been there for about 6 months Quality: Patient reports No Pain. Severity: Patient states wound(s) are getting worse. Duration: Patient has had the wound for > 6 months prior to seeking treatment at the wound center Context: The wound would happen gradually Modifying Factors: Patient wound(s)/ulcer(s) are worsening due to :continual drainage from the wound ssociated Signs and Symptoms: Patient reports having increase discharge. A HPI Description: This patient returns after being seen here till the end of August and he was lost to follow-up. he has been quite debilitated laying in bed most of the time and his condition has deteriorated significantly. He has multiple ulcerations on the heel lateral forefoot and some of his toes. ======== Old notes: 74 year old male known to our practice when he was seen here in February and March and was lost to follow-up when he was admitted to hospital with various medical problems including coronary artery disease and a stroke. Now returns with the problem on the left forefoot where he has an ulceration and this has been there for about 6 months. most recently he was in hospital between July 6 and July 16, when he was admitted and treated for acute respiratory failure is secondary to aspiration pneumonia, large non-STEMI, ischemic cardiomyopathy with systolic and diastolic congestive heart failure with ejection fraction about 15-20%, ventricular tachycardia and has been treated with amiodarone, acute on careful up at the common new acute CVA, acute chronic kidney disease stage III, anemia, uncontrolled  diabetes mellitus with last hemoglobin A1c being 12%. He has had persistent hyperglycemia given recently. Patient has a past medical history of diabetes mellitus, hypertension, combined systolic and diastolic heart failure, peripheral neuropathy, gout, cardiomyopathy with ejection fraction of about 10-15%, coronary artery disease,  [name] [name], [name] [name] ([id number]) Visit Report for 11/03/2019 Debridement Details Patient Name: Date of Service: [name] [name] [facility] F. 11/03/2019 10:15 A M Medical Record Number: [id number] Patient Account Number: [id number] Date of Birth/Sex: Treating RN: 08/28/1945 (74 y.o. [name]) [name] Primary Care Provider: [name], [name] Other Clinician: Referring Provider: Treating Provider/Extender: Cicley Ganesh [name], [name] Weeks in Treatment: 185 Debridement Performed for Assessment: Wound #14 Left,Lateral Lower Leg Performed By: Physician [name], MD Debridement Type: Debridement Severity of Tissue Pre Debridement: Fat layer exposed Level of Consciousness (Pre-procedure): Awake and Alert Pre-procedure Verification/Time Out Yes - 11:23 Taken: Start Time: 11:23 Pain Control: Lidocaine 5% topical ointment T Area Debrided (L x W): otal 10.3 (cm) x 1 (cm) = 10.3 (cm) Tissue and other material debrided: Viable, Non-Viable, Subcutaneous Level: Skin/Subcutaneous Tissue Debridement Description: Excisional Instrument: Curette Bleeding: Moderate Hemostasis Achieved: Pressure End Time: 11:25 Procedural Pain: 0 Post Procedural Pain: 0 Response to Treatment: Procedure was tolerated well Level of Consciousness (Post- Awake and Alert procedure): Post Debridement Measurements of Total Wound Length: (cm) 10.3 Width: (cm) 1 Depth: (cm) 0.2 Volume: (cm) 1.618 Character of Wound/Ulcer Post Debridement: Improved Severity of Tissue Post Debridement: Fat layer exposed Post Procedure Diagnosis Same as Pre-procedure Electronic Signature(s) Signed: 11/03/2019 5:16:39 PM By: Linton Ham MD Signed: 11/03/2019 5:57:23 PM By: [name] RN Entered By: Linton Ham on 11/03/2019 11:45:45 -------------------------------------------------------------------------------- HPI Details Patient Name: Date of Service: [name] [name] F. 11/03/2019 10:15 A M Medical Record Number:  [id number] Patient Account Number: [id number] Date of Birth/Sex: Treating RN: 30-Nov-1945 (74 y.o. [name] Primary Care Provider: [name], [name] Other Clinician: Referring Provider: Treating Provider/Extender: [name] [name] [name], [name] Weeks in Treatment: 185 History of Present Illness Location: On the left and right lateral forefoot which has been there for about 6 months Quality: Patient reports No Pain. Severity: Patient states wound(s) are getting worse. Duration: Patient has had the wound for > 6 months prior to seeking treatment at the wound center Context: The wound would happen gradually Modifying Factors: Patient wound(s)/ulcer(s) are worsening due to :continual drainage from the wound ssociated Signs and Symptoms: Patient reports having increase discharge. A HPI Description: This patient returns after being seen here till the end of August and he was lost to follow-up. he has been quite debilitated laying in bed most of the time and his condition has deteriorated significantly. He has multiple ulcerations on the heel lateral forefoot and some of his toes. ======== Old notes: 74 year old male known to our practice when he was seen here in February and March and was lost to follow-up when he was admitted to hospital with various medical problems including coronary artery disease and a stroke. Now returns with the problem on the left forefoot where he has an ulceration and this has been there for about 6 months. most recently he was in hospital between July 6 and July 16, when he was admitted and treated for acute respiratory failure is secondary to aspiration pneumonia, large non-STEMI, ischemic cardiomyopathy with systolic and diastolic congestive heart failure with ejection fraction about 15-20%, ventricular tachycardia and has been treated with amiodarone, acute on careful up at the common new acute CVA, acute chronic kidney disease stage III, anemia, uncontrolled  diabetes mellitus with last hemoglobin A1c being 12%. He has had persistent hyperglycemia given recently. Patient has a past medical history of diabetes mellitus, hypertension, combined systolic and diastolic heart failure, peripheral neuropathy, gout, cardiomyopathy with ejection fraction of about 10-15%, coronary artery disease,  [name] [name], [name] [name] ([id number]) Visit Report for 11/03/2019 Debridement Details Patient Name: Date of Service: [name] [name] [facility] F. 11/03/2019 10:15 A M Medical Record Number: [id number] Patient Account Number: [id number] Date of Birth/Sex: Treating RN: 08/28/1945 (74 y.o. [name]) [name] Primary Care Provider: [name], [name] Other Clinician: Referring Provider: Treating Provider/Extender: Cicley Ganesh [name], [name] Weeks in Treatment: 185 Debridement Performed for Assessment: Wound #14 Left,Lateral Lower Leg Performed By: Physician [name], MD Debridement Type: Debridement Severity of Tissue Pre Debridement: Fat layer exposed Level of Consciousness (Pre-procedure): Awake and Alert Pre-procedure Verification/Time Out Yes - 11:23 Taken: Start Time: 11:23 Pain Control: Lidocaine 5% topical ointment T Area Debrided (L x W): otal 10.3 (cm) x 1 (cm) = 10.3 (cm) Tissue and other material debrided: Viable, Non-Viable, Subcutaneous Level: Skin/Subcutaneous Tissue Debridement Description: Excisional Instrument: Curette Bleeding: Moderate Hemostasis Achieved: Pressure End Time: 11:25 Procedural Pain: 0 Post Procedural Pain: 0 Response to Treatment: Procedure was tolerated well Level of Consciousness (Post- Awake and Alert procedure): Post Debridement Measurements of Total Wound Length: (cm) 10.3 Width: (cm) 1 Depth: (cm) 0.2 Volume: (cm) 1.618 Character of Wound/Ulcer Post Debridement: Improved Severity of Tissue Post Debridement: Fat layer exposed Post Procedure Diagnosis Same as Pre-procedure Electronic Signature(s) Signed: 11/03/2019 5:16:39 PM By: Linton Ham MD Signed: 11/03/2019 5:57:23 PM By: [name] RN Entered By: Linton Ham on 11/03/2019 11:45:45 -------------------------------------------------------------------------------- HPI Details Patient Name: Date of Service: [name] [name] F. 11/03/2019 10:15 A M Medical Record Number:  [id number] Patient Account Number: [id number] Date of Birth/Sex: Treating RN: 30-Nov-1945 (74 y.o. [name] Primary Care Provider: [name], [name] Other Clinician: Referring Provider: Treating Provider/Extender: [name] [name] [name], [name] Weeks in Treatment: 185 History of Present Illness Location: On the left and right lateral forefoot which has been there for about 6 months Quality: Patient reports No Pain. Severity: Patient states wound(s) are getting worse. Duration: Patient has had the wound for > 6 months prior to seeking treatment at the wound center Context: The wound would happen gradually Modifying Factors: Patient wound(s)/ulcer(s) are worsening due to :continual drainage from the wound ssociated Signs and Symptoms: Patient reports having increase discharge. A HPI Description: This patient returns after being seen here till the end of August and he was lost to follow-up. he has been quite debilitated laying in bed most of the time and his condition has deteriorated significantly. He has multiple ulcerations on the heel lateral forefoot and some of his toes. ======== Old notes: 74 year old male known to our practice when he was seen here in February and March and was lost to follow-up when he was admitted to hospital with various medical problems including coronary artery disease and a stroke. Now returns with the problem on the left forefoot where he has an ulceration and this has been there for about 6 months. most recently he was in hospital between July 6 and July 16, when he was admitted and treated for acute respiratory failure is secondary to aspiration pneumonia, large non-STEMI, ischemic cardiomyopathy with systolic and diastolic congestive heart failure with ejection fraction about 15-20%, ventricular tachycardia and has been treated with amiodarone, acute on careful up at the common new acute CVA, acute chronic kidney disease stage III, anemia, uncontrolled  diabetes mellitus with last hemoglobin A1c being 12%. He has had persistent hyperglycemia given recently. Patient has a past medical history of diabetes mellitus, hypertension, combined systolic and diastolic heart failure, peripheral neuropathy, gout, cardiomyopathy with ejection fraction of about 10-15%, coronary artery disease,  Code Description Active Date MDM Diagnosis L89.613 Pressure ulcer of right heel, stage 3 04/17/2016 No Yes L97.221 Non-pressure chronic ulcer of left calf limited to breakdown of skin 08/15/2017 No Yes L97.321 Non-pressure chronic ulcer of left ankle limited to breakdown of skin  07/01/2018 No Yes L97.521 Non-pressure chronic ulcer of other part of left foot limited to breakdown of 02/17/2019 No Yes skin L97.211 Non-pressure chronic ulcer of right calf limited to breakdown of skin 07/21/2019 No Yes Inactive Problems ICD-10 Code Description Active Date Inactive Date E11.621 Type 2 diabetes mellitus with foot ulcer 04/17/2016 04/17/2016 L03.032 Cellulitis of left toe 01/06/2019 01/06/2019 F68.127 Pressure ulcer of left heel, stage 3 12/04/2016 12/04/2016 I25.119 Atherosclerotic heart disease of native coronary artery with unspecified angina 04/17/2016 04/17/2016 pectoris L97.821 Non-pressure chronic ulcer of other part of left lower leg limited to breakdown of skin 01/06/2019 01/06/2019 S51.811D Laceration without foreign body of right forearm, subsequent encounter 10/22/2017 10/22/2017 N17.00 Acute diastolic (congestive) heart failure 04/17/2016 04/17/2016 L03.116 Cellulitis of left lower limb 12/24/2017 12/24/2017 L89.620 Pressure ulcer of left heel, unstageable 07/21/2019 07/21/2019 S80.211D Abrasion, right knee, subsequent encounter 08/18/2019 08/18/2019 Resolved Problems ICD-10 Code Description Active Date Resolved Date L89.512 Pressure ulcer of right ankle, stage 2 04/17/2016 04/17/2016 L89.522 Pressure ulcer of left ankle, stage 2 04/17/2016 04/17/2016 Electronic Signature(s) Signed: 11/03/2019 5:16:39 PM By: Linton Ham MD Entered By: Linton Ham on 11/03/2019 11:44:50 -------------------------------------------------------------------------------- Progress Note Details Patient Name: Date of Service: [name] [name] F. 11/03/2019 10:15 A M Medical Record Number: [id number] Patient Account Number: [id number] Date of Birth/Sex: Treating RN: 1945/05/21 (74 y.o. [name] Primary Care Provider: [name], [name] Other Clinician: Referring Provider: Treating Provider/Extender: [name] [name] [name], [name] Weeks in Treatment: 185 Subjective History of Present  Illness (HPI) The following HPI elements were documented for the patient's wound: Location: On the left and right lateral forefoot which has been there for about 6 months Quality: Patient reports No Pain. Severity: Patient states wound(s) are getting worse. Duration: Patient has had the wound for > 6 months prior to seeking treatment at the wound center Context: The wound would happen gradually Modifying Factors: Patient wound(s)/ulcer(s) are worsening due to :continual drainage from the wound Associated Signs and Symptoms: Patient reports having increase discharge. This patient returns after being seen here till the end of August and he was lost to follow-up. he has been quite debilitated laying in bed most of the time and his condition has deteriorated significantly. He has multiple ulcerations on the heel lateral forefoot and some of his toes. ======== Old notes: 74 year old male known to our practice when he was seen here in February and March and was lost to follow-up when he was admitted to hospital with various medical problems including coronary artery disease and a stroke. Now returns with the problem on the left forefoot where he has an ulceration and this has been there for about 6 months. 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 He has had persistent hyperglycemia given recently. 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 08/15/2017 No Yes L97.321 Non-pressure chronic ulcer of left ankle limited to breakdown of skin  07/01/2018 No Yes L97.521 Non-pressure chronic ulcer of other part of left foot limited to breakdown of 02/17/2019 No Yes skin L97.211 Non-pressure chronic ulcer of right calf limited to breakdown of skin 07/21/2019 No Yes Inactive Problems ICD-10 Code Description Active Date Inactive Date E11.621 Type 2 diabetes mellitus with foot ulcer 04/17/2016 04/17/2016 L03.032 Cellulitis of left toe 01/06/2019 01/06/2019 F68.127 Pressure ulcer of left heel, stage 3 12/04/2016 12/04/2016 I25.119 Atherosclerotic heart disease of native coronary artery with unspecified angina 04/17/2016 04/17/2016 pectoris L97.821 Non-pressure chronic ulcer of other part of left lower leg limited to breakdown of skin 01/06/2019 01/06/2019 S51.811D Laceration without foreign body of right forearm, subsequent encounter 10/22/2017 10/22/2017 N17.00 Acute diastolic (congestive) heart failure 04/17/2016 04/17/2016 L03.116 Cellulitis of left lower limb 12/24/2017 12/24/2017 L89.620 Pressure ulcer of left heel, unstageable 07/21/2019 07/21/2019 S80.211D Abrasion, right knee, subsequent encounter 08/18/2019 08/18/2019 Resolved Problems ICD-10 Code Description Active Date Resolved Date L89.512 Pressure ulcer of right ankle, stage 2 04/17/2016 04/17/2016 L89.522 Pressure ulcer of left ankle, stage 2 04/17/2016 04/17/2016 Electronic Signature(s) Signed: 11/03/2019 5:16:39 PM By: Linton Ham MD Entered By: Linton Ham on 11/03/2019 11:44:50 -------------------------------------------------------------------------------- Progress Note Details Patient Name: Date of Service: [name] [name] F. 11/03/2019 10:15 A M Medical Record Number: [id number] Patient Account Number: [id number] Date of Birth/Sex: Treating RN: 1945/05/21 (74 y.o. [name] Primary Care Provider: [name], [name] Other Clinician: Referring Provider: Treating Provider/Extender: [name] [name] [name], [name] Weeks in Treatment: 185 Subjective History of Present  Illness (HPI) The following HPI elements were documented for the patient's wound: Location: On the left and right lateral forefoot which has been there for about 6 months Quality: Patient reports No Pain. Severity: Patient states wound(s) are getting worse. Duration: Patient has had the wound for > 6 months prior to seeking treatment at the wound center Context: The wound would happen gradually Modifying Factors: Patient wound(s)/ulcer(s) are worsening due to :continual drainage from the wound Associated Signs and Symptoms: Patient reports having increase discharge. This patient returns after being seen here till the end of August and he was lost to follow-up. he has been quite debilitated laying in bed most of the time and his condition has deteriorated significantly. He has multiple ulcerations on the heel lateral forefoot and some of his toes. ======== Old notes: 74 year old male known to our practice when he was seen here in February and March and was lost to follow-up when he was admitted to hospital with various medical problems including coronary artery disease and a stroke. Now returns with the problem on the left forefoot where he has an ulceration and this has been there for about 6 months. 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 He has had persistent hyperglycemia given recently. 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 08/15/2017 No Yes L97.321 Non-pressure chronic ulcer of left ankle limited to breakdown of skin  07/01/2018 No Yes L97.521 Non-pressure chronic ulcer of other part of left foot limited to breakdown of 02/17/2019 No Yes skin L97.211 Non-pressure chronic ulcer of right calf limited to breakdown of skin 07/21/2019 No Yes Inactive Problems ICD-10 Code Description Active Date Inactive Date E11.621 Type 2 diabetes mellitus with foot ulcer 04/17/2016 04/17/2016 L03.032 Cellulitis of left toe 01/06/2019 01/06/2019 F68.127 Pressure ulcer of left heel, stage 3 12/04/2016 12/04/2016 I25.119 Atherosclerotic heart disease of native coronary artery with unspecified angina 04/17/2016 04/17/2016 pectoris L97.821 Non-pressure chronic ulcer of other part of left lower leg limited to breakdown of skin 01/06/2019 01/06/2019 S51.811D Laceration without foreign body of right forearm, subsequent encounter 10/22/2017 10/22/2017 N17.00 Acute diastolic (congestive) heart failure 04/17/2016 04/17/2016 L03.116 Cellulitis of left lower limb 12/24/2017 12/24/2017 L89.620 Pressure ulcer of left heel, unstageable 07/21/2019 07/21/2019 S80.211D Abrasion, right knee, subsequent encounter 08/18/2019 08/18/2019 Resolved Problems ICD-10 Code Description Active Date Resolved Date L89.512 Pressure ulcer of right ankle, stage 2 04/17/2016 04/17/2016 L89.522 Pressure ulcer of left ankle, stage 2 04/17/2016 04/17/2016 Electronic Signature(s) Signed: 11/03/2019 5:16:39 PM By: Linton Ham MD Entered By: Linton Ham on 11/03/2019 11:44:50 -------------------------------------------------------------------------------- Progress Note Details Patient Name: Date of Service: [name] [name] F. 11/03/2019 10:15 A M Medical Record Number: [id number] Patient Account Number: [id number] Date of Birth/Sex: Treating RN: 1945/05/21 (74 y.o. [name] Primary Care Provider: [name], [name] Other Clinician: Referring Provider: Treating Provider/Extender: [name] [name] [name], [name] Weeks in Treatment: 185 Subjective History of Present  Illness (HPI) The following HPI elements were documented for the patient's wound: Location: On the left and right lateral forefoot which has been there for about 6 months Quality: Patient reports No Pain. Severity: Patient states wound(s) are getting worse. Duration: Patient has had the wound for > 6 months prior to seeking treatment at the wound center Context: The wound would happen gradually Modifying Factors: Patient wound(s)/ulcer(s) are worsening due to :continual drainage from the wound Associated Signs and Symptoms: Patient reports having increase discharge. This patient returns after being seen here till the end of August and he was lost to follow-up. he has been quite debilitated laying in bed most of the time and his condition has deteriorated significantly. He has multiple ulcerations on the heel lateral forefoot and some of his toes. ======== Old notes: 74 year old male known to our practice when he was seen here in February and March and was lost to follow-up when he was admitted to hospital with various medical problems including coronary artery disease and a stroke. Now returns with the problem on the left forefoot where he has an ulceration and this has been there for about 6 months. 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 He has had persistent hyperglycemia given recently. 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 08/15/2017 No Yes L97.321 Non-pressure chronic ulcer of left ankle limited to breakdown of skin  07/01/2018 No Yes L97.521 Non-pressure chronic ulcer of other part of left foot limited to breakdown of 02/17/2019 No Yes skin L97.211 Non-pressure chronic ulcer of right calf limited to breakdown of skin 07/21/2019 No Yes Inactive Problems ICD-10 Code Description Active Date Inactive Date E11.621 Type 2 diabetes mellitus with foot ulcer 04/17/2016 04/17/2016 L03.032 Cellulitis of left toe 01/06/2019 01/06/2019 F68.127 Pressure ulcer of left heel, stage 3 12/04/2016 12/04/2016 I25.119 Atherosclerotic heart disease of native coronary artery with unspecified angina 04/17/2016 04/17/2016 pectoris L97.821 Non-pressure chronic ulcer of other part of left lower leg limited to breakdown of skin 01/06/2019 01/06/2019 S51.811D Laceration without foreign body of right forearm, subsequent encounter 10/22/2017 10/22/2017 N17.00 Acute diastolic (congestive) heart failure 04/17/2016 04/17/2016 L03.116 Cellulitis of left lower limb 12/24/2017 12/24/2017 L89.620 Pressure ulcer of left heel, unstageable 07/21/2019 07/21/2019 S80.211D Abrasion, right knee, subsequent encounter 08/18/2019 08/18/2019 Resolved Problems ICD-10 Code Description Active Date Resolved Date L89.512 Pressure ulcer of right ankle, stage 2 04/17/2016 04/17/2016 L89.522 Pressure ulcer of left ankle, stage 2 04/17/2016 04/17/2016 Electronic Signature(s) Signed: 11/03/2019 5:16:39 PM By: Linton Ham MD Entered By: Linton Ham on 11/03/2019 11:44:50 -------------------------------------------------------------------------------- Progress Note Details Patient Name: Date of Service: [name] [name] F. 11/03/2019 10:15 A M Medical Record Number: [id number] Patient Account Number: [id number] Date of Birth/Sex: Treating RN: 1945/05/21 (74 y.o. [name] Primary Care Provider: [name], [name] Other Clinician: Referring Provider: Treating Provider/Extender: [name] [name] [name], [name] Weeks in Treatment: 185 Subjective History of Present  Illness (HPI) The following HPI elements were documented for the patient's wound: Location: On the left and right lateral forefoot which has been there for about 6 months Quality: Patient reports No Pain. Severity: Patient states wound(s) are getting worse. Duration: Patient has had the wound for > 6 months prior to seeking treatment at the wound center Context: The wound would happen gradually Modifying Factors: Patient wound(s)/ulcer(s) are worsening due to :continual drainage from the wound Associated Signs and Symptoms: Patient reports having increase discharge. This patient returns after being seen here till the end of August and he was lost to follow-up. he has been quite debilitated laying in bed most of the time and his condition has deteriorated significantly. He has multiple ulcerations on the heel lateral forefoot and some of his toes. ======== Old notes: 74 year old male known to our practice when he was seen here in February and March and was lost to follow-up when he was admitted to hospital with various medical problems including coronary artery disease and a stroke. Now returns with the problem on the left forefoot where he has an ulceration and this has been there for about 6 months. 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 He has had persistent hyperglycemia given recently. 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 08/15/2017 No Yes L97.321 Non-pressure chronic ulcer of left ankle limited to breakdown of skin  07/01/2018 No Yes L97.521 Non-pressure chronic ulcer of other part of left foot limited to breakdown of 02/17/2019 No Yes skin L97.211 Non-pressure chronic ulcer of right calf limited to breakdown of skin 07/21/2019 No Yes Inactive Problems ICD-10 Code Description Active Date Inactive Date E11.621 Type 2 diabetes mellitus with foot ulcer 04/17/2016 04/17/2016 L03.032 Cellulitis of left toe 01/06/2019 01/06/2019 F68.127 Pressure ulcer of left heel, stage 3 12/04/2016 12/04/2016 I25.119 Atherosclerotic heart disease of native coronary artery with unspecified angina 04/17/2016 04/17/2016 pectoris L97.821 Non-pressure chronic ulcer of other part of left lower leg limited to breakdown of skin 01/06/2019 01/06/2019 S51.811D Laceration without foreign body of right forearm, subsequent encounter 10/22/2017 10/22/2017 N17.00 Acute diastolic (congestive) heart failure 04/17/2016 04/17/2016 L03.116 Cellulitis of left lower limb 12/24/2017 12/24/2017 L89.620 Pressure ulcer of left heel, unstageable 07/21/2019 07/21/2019 S80.211D Abrasion, right knee, subsequent encounter 08/18/2019 08/18/2019 Resolved Problems ICD-10 Code Description Active Date Resolved Date L89.512 Pressure ulcer of right ankle, stage 2 04/17/2016 04/17/2016 L89.522 Pressure ulcer of left ankle, stage 2 04/17/2016 04/17/2016 Electronic Signature(s) Signed: 11/03/2019 5:16:39 PM By: Linton Ham MD Entered By: Linton Ham on 11/03/2019 11:44:50 -------------------------------------------------------------------------------- Progress Note Details Patient Name: Date of Service: [name] [name] F. 11/03/2019 10:15 A M Medical Record Number: [id number] Patient Account Number: [id number] Date of Birth/Sex: Treating RN: 1945/05/21 (74 y.o. [name] Primary Care Provider: [name], [name] Other Clinician: Referring Provider: Treating Provider/Extender: [name] [name] [name], [name] Weeks in Treatment: 185 Subjective History of Present  Illness (HPI) The following HPI elements were documented for the patient's wound: Location: On the left and right lateral forefoot which has been there for about 6 months Quality: Patient reports No Pain. Severity: Patient states wound(s) are getting worse. Duration: Patient has had the wound for > 6 months prior to seeking treatment at the wound center Context: The wound would happen gradually Modifying Factors: Patient wound(s)/ulcer(s) are worsening due to :continual drainage from the wound Associated Signs and Symptoms: Patient reports having increase discharge. This patient returns after being seen here till the end of August and he was lost to follow-up. he has been quite debilitated laying in bed most of the time and his condition has deteriorated significantly. He has multiple ulcerations on the heel lateral forefoot and some of his toes. ======== Old notes: 74 year old male known to our practice when he was seen here in February and March and was lost to follow-up when he was admitted to hospital with various medical problems including coronary artery disease and a stroke. Now returns with the problem on the left forefoot where he has an ulceration and this has been there for about 6 months. 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 He has had persistent hyperglycemia given recently. Patient has a past medical history of diabetes mellitus, hypertension, combined systolic and diastolic heart failure,  [name] [name], [name] [name] ([id number]) Visit Report for 11/03/2019 Debridement Details Patient Name: Date of Service: [name] [name] [facility] F. 11/03/2019 10:15 A M Medical Record Number: [id number] Patient Account Number: [id number] Date of Birth/Sex: Treating RN: 08/28/1945 (74 y.o. [name]) [name] Primary Care Provider: [name], [name] Other Clinician: Referring Provider: Treating Provider/Extender: Cicley Ganesh [name], [name] Weeks in Treatment: 185 Debridement Performed for Assessment: Wound #14 Left,Lateral Lower Leg Performed By: Physician [name], MD Debridement Type: Debridement Severity of Tissue Pre Debridement: Fat layer exposed Level of Consciousness (Pre-procedure): Awake and Alert Pre-procedure Verification/Time Out Yes - 11:23 Taken: Start Time: 11:23 Pain Control: Lidocaine 5% topical ointment T Area Debrided (L x W): otal 10.3 (cm) x 1 (cm) = 10.3 (cm) Tissue and other material debrided: Viable, Non-Viable, Subcutaneous Level: Skin/Subcutaneous Tissue Debridement Description: Excisional Instrument: Curette Bleeding: Moderate Hemostasis Achieved: Pressure End Time: 11:25 Procedural Pain: 0 Post Procedural Pain: 0 Response to Treatment: Procedure was tolerated well Level of Consciousness (Post- Awake and Alert procedure): Post Debridement Measurements of Total Wound Length: (cm) 10.3 Width: (cm) 1 Depth: (cm) 0.2 Volume: (cm) 1.618 Character of Wound/Ulcer Post Debridement: Improved Severity of Tissue Post Debridement: Fat layer exposed Post Procedure Diagnosis Same as Pre-procedure Electronic Signature(s) Signed: 11/03/2019 5:16:39 PM By: Linton Ham MD Signed: 11/03/2019 5:57:23 PM By: [name] RN Entered By: Linton Ham on 11/03/2019 11:45:45 -------------------------------------------------------------------------------- HPI Details Patient Name: Date of Service: [name] [name] F. 11/03/2019 10:15 A M Medical Record Number:  [id number] Patient Account Number: [id number] Date of Birth/Sex: Treating RN: 30-Nov-1945 (74 y.o. [name] Primary Care Provider: [name], [name] Other Clinician: Referring Provider: Treating Provider/Extender: [name] [name] [name], [name] Weeks in Treatment: 185 History of Present Illness Location: On the left and right lateral forefoot which has been there for about 6 months Quality: Patient reports No Pain. Severity: Patient states wound(s) are getting worse. Duration: Patient has had the wound for > 6 months prior to seeking treatment at the wound center Context: The wound would happen gradually Modifying Factors: Patient wound(s)/ulcer(s) are worsening due to :continual drainage from the wound ssociated Signs and Symptoms: Patient reports having increase discharge. A HPI Description: This patient returns after being seen here till the end of August and he was lost to follow-up. he has been quite debilitated laying in bed most of the time and his condition has deteriorated significantly. He has multiple ulcerations on the heel lateral forefoot and some of his toes. ======== Old notes: 74 year old male known to our practice when he was seen here in February and March and was lost to follow-up when he was admitted to hospital with various medical problems including coronary artery disease and a stroke. Now returns with the problem on the left forefoot where he has an ulceration and this has been there for about 6 months. most recently he was in hospital between July 6 and July 16, when he was admitted and treated for acute respiratory failure is secondary to aspiration pneumonia, large non-STEMI, ischemic cardiomyopathy with systolic and diastolic congestive heart failure with ejection fraction about 15-20%, ventricular tachycardia and has been treated with amiodarone, acute on careful up at the common new acute CVA, acute chronic kidney disease stage III, anemia, uncontrolled  diabetes mellitus with last hemoglobin A1c being 12%. He has had persistent hyperglycemia given recently. Patient has a past medical history of diabetes mellitus, hypertension, combined systolic and diastolic heart failure, peripheral neuropathy, gout, cardiomyopathy with ejection fraction of about 10-15%, coronary artery disease,  the Left,Lateral Lower Leg .Severity of Tissue Pre Debridement is: Fat layer exposed. There was a Excisional Skin/Subcutaneous Tissue Debridement with a total area of 10.3 sq cm performed by [name], MD. With the following instrument(s): Curette to remove Viable and Non-Viable tissue/material. Material removed includes Subcutaneous Tissue after achieving pain control using Lidocaine 5% topical ointment. No specimens were taken. A time out was conducted at 11:23, prior to the start of the procedure. A Moderate amount of bleeding was controlled with Pressure. The procedure was tolerated well with a pain level of 0 throughout and a pain level of 0 following the procedure. Post Debridement Measurements: 10.3cm length x 1cm width x 0.2cm depth; 1.618cm^3 volume. Character of Wound/Ulcer Post Debridement is improved. Severity of Tissue Post Debridement is: Fat layer exposed. Post procedure Diagnosis Wound #14: Same as Pre-Procedure Pre-procedure diagnosis of Wound #14 is a  Diabetic Wound/Ulcer of the Lower Extremity located on the Left,Lateral Lower Leg . There was a Double Layer Compression Therapy Procedure by [name], RN. Post procedure Diagnosis Wound #14: Same as Pre-Procedure Wound #23 Pre-procedure diagnosis of Wound #23 is a Diabetic Wound/Ulcer of the Lower Extremity located on the Right,Lateral Calcaneus . There was a Double Layer Compression Therapy Procedure by [name], RN. Post procedure Diagnosis Wound #23: Same as Pre-Procedure Wound #25 Pre-procedure diagnosis of Wound #25 is a Venous Leg Ulcer located on the Right,Lateral Lower Leg . There was a Double Layer Compression Therapy Procedure by [name], RN. Post procedure Diagnosis Wound #25: Same as Pre-Procedure Wound #26 Pre-procedure diagnosis of Wound #26 is a Diabetic Wound/Ulcer of the Lower Extremity located on the Left,Distal Foot . There was a Double Layer Compression Therapy Procedure by [name], RN. Post procedure Diagnosis Wound #26: Same as Pre-Procedure Plan Follow-up Appointments: Return appointment in 3 weeks. Dressing Change Frequency: Wound #14 Left,Lateral Lower Leg: Change dressing three times week. Change dressing three times week. Wound #23 Right,Lateral Calcaneus: Change Dressing every other day. Wound #25 Right,Lateral Lower Leg: Change Dressing every other day. Wound #26 Left,Distal Foot: Change dressing three times week. Skin Barriers/Peri-Wound Care: Other: - TCA to left lower extrimity Wound Cleansing: Clean wound with Normal Saline. May shower with protection. Primary Wound Dressing: Wound #14 Left,Lateral Lower Leg: Iodoflex Wound #23 Right,Lateral Calcaneus: Polymem Silver Wound #25 Right,Lateral Lower Leg: Polymem Silver Wound #26 Left,Distal Foot: Polymem Silver Secondary Dressing: Dry Gauze Other: - apply a triple antibiotic ointment and bandaid to left elbow skin tear. Edema Control: Kerlix and Coban - Bilateral - use  cotton roll not kerlix Avoid standing for long periods of time Elevate legs to the level of the heart or above for 30 minutes daily and/or when sitting, a frequency of: - throughout the day Off-Loading: Multipodus Splint to: - patient to wear bunny boots to both feet while resting in chair and bed. 1. TCA to the left lower extremity 2. I am going to use Iodoflex on the left lateral leg wound but continue polymen Ag everywhere else 3. The patient has a combination of venous insufficiency wounds and probably pressure ulcers. He does not have an arterial issue Electronic Signature(s) Signed: 11/03/2019 5:16:39 PM By: Linton Ham MD Entered By: Linton Ham on 11/03/2019 11:52:51 -------------------------------------------------------------------------------- SuperBill Details Patient Name: Date of Service: [name] [name] F. 11/03/2019 Medical Record Number: [id number] Patient Account Number: [id number] Date of Birth/Sex: Treating RN: 1946-03-20 (74 y.o. [name] Primary Care Provider: [name], [name] Other Clinician: Referring Provider: Treating Provider/Extender: [name] [name]  Code Description Active Date MDM Diagnosis L89.613 Pressure ulcer of right heel, stage 3 04/17/2016 No Yes L97.221 Non-pressure chronic ulcer of left calf limited to breakdown of skin 08/15/2017 No Yes L97.321 Non-pressure chronic ulcer of left ankle limited to breakdown of skin  07/01/2018 No Yes L97.521 Non-pressure chronic ulcer of other part of left foot limited to breakdown of 02/17/2019 No Yes skin L97.211 Non-pressure chronic ulcer of right calf limited to breakdown of skin 07/21/2019 No Yes Inactive Problems ICD-10 Code Description Active Date Inactive Date E11.621 Type 2 diabetes mellitus with foot ulcer 04/17/2016 04/17/2016 L03.032 Cellulitis of left toe 01/06/2019 01/06/2019 F68.127 Pressure ulcer of left heel, stage 3 12/04/2016 12/04/2016 I25.119 Atherosclerotic heart disease of native coronary artery with unspecified angina 04/17/2016 04/17/2016 pectoris L97.821 Non-pressure chronic ulcer of other part of left lower leg limited to breakdown of skin 01/06/2019 01/06/2019 S51.811D Laceration without foreign body of right forearm, subsequent encounter 10/22/2017 10/22/2017 N17.00 Acute diastolic (congestive) heart failure 04/17/2016 04/17/2016 L03.116 Cellulitis of left lower limb 12/24/2017 12/24/2017 L89.620 Pressure ulcer of left heel, unstageable 07/21/2019 07/21/2019 S80.211D Abrasion, right knee, subsequent encounter 08/18/2019 08/18/2019 Resolved Problems ICD-10 Code Description Active Date Resolved Date L89.512 Pressure ulcer of right ankle, stage 2 04/17/2016 04/17/2016 L89.522 Pressure ulcer of left ankle, stage 2 04/17/2016 04/17/2016 Electronic Signature(s) Signed: 11/03/2019 5:16:39 PM By: Linton Ham MD Entered By: Linton Ham on 11/03/2019 11:44:50 -------------------------------------------------------------------------------- Progress Note Details Patient Name: Date of Service: [name] [name] F. 11/03/2019 10:15 A M Medical Record Number: [id number] Patient Account Number: [id number] Date of Birth/Sex: Treating RN: 1945/05/21 (74 y.o. [name] Primary Care Provider: [name], [name] Other Clinician: Referring Provider: Treating Provider/Extender: [name] [name] [name], [name] Weeks in Treatment: 185 Subjective History of Present  Illness (HPI) The following HPI elements were documented for the patient's wound: Location: On the left and right lateral forefoot which has been there for about 6 months Quality: Patient reports No Pain. Severity: Patient states wound(s) are getting worse. Duration: Patient has had the wound for > 6 months prior to seeking treatment at the wound center Context: The wound would happen gradually Modifying Factors: Patient wound(s)/ulcer(s) are worsening due to :continual drainage from the wound Associated Signs and Symptoms: Patient reports having increase discharge. This patient returns after being seen here till the end of August and he was lost to follow-up. he has been quite debilitated laying in bed most of the time and his condition has deteriorated significantly. He has multiple ulcerations on the heel lateral forefoot and some of his toes. ======== Old notes: 74 year old male known to our practice when he was seen here in February and March and was lost to follow-up when he was admitted to hospital with various medical problems including coronary artery disease and a stroke. Now returns with the problem on the left forefoot where he has an ulceration and this has been there for about 6 months. 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 He has had persistent hyperglycemia given recently. Patient has a past medical history of diabetes mellitus, hypertension, combined systolic and diastolic heart failure,  the Left,Lateral Lower Leg .Severity of Tissue Pre Debridement is: Fat layer exposed. There was a Excisional Skin/Subcutaneous Tissue Debridement with a total area of 10.3 sq cm performed by [name], MD. With the following instrument(s): Curette to remove Viable and Non-Viable tissue/material. Material removed includes Subcutaneous Tissue after achieving pain control using Lidocaine 5% topical ointment. No specimens were taken. A time out was conducted at 11:23, prior to the start of the procedure. A Moderate amount of bleeding was controlled with Pressure. The procedure was tolerated well with a pain level of 0 throughout and a pain level of 0 following the procedure. Post Debridement Measurements: 10.3cm length x 1cm width x 0.2cm depth; 1.618cm^3 volume. Character of Wound/Ulcer Post Debridement is improved. Severity of Tissue Post Debridement is: Fat layer exposed. Post procedure Diagnosis Wound #14: Same as Pre-Procedure Pre-procedure diagnosis of Wound #14 is a  Diabetic Wound/Ulcer of the Lower Extremity located on the Left,Lateral Lower Leg . There was a Double Layer Compression Therapy Procedure by [name], RN. Post procedure Diagnosis Wound #14: Same as Pre-Procedure Wound #23 Pre-procedure diagnosis of Wound #23 is a Diabetic Wound/Ulcer of the Lower Extremity located on the Right,Lateral Calcaneus . There was a Double Layer Compression Therapy Procedure by [name], RN. Post procedure Diagnosis Wound #23: Same as Pre-Procedure Wound #25 Pre-procedure diagnosis of Wound #25 is a Venous Leg Ulcer located on the Right,Lateral Lower Leg . There was a Double Layer Compression Therapy Procedure by [name], RN. Post procedure Diagnosis Wound #25: Same as Pre-Procedure Wound #26 Pre-procedure diagnosis of Wound #26 is a Diabetic Wound/Ulcer of the Lower Extremity located on the Left,Distal Foot . There was a Double Layer Compression Therapy Procedure by [name], RN. Post procedure Diagnosis Wound #26: Same as Pre-Procedure Plan Follow-up Appointments: Return appointment in 3 weeks. Dressing Change Frequency: Wound #14 Left,Lateral Lower Leg: Change dressing three times week. Change dressing three times week. Wound #23 Right,Lateral Calcaneus: Change Dressing every other day. Wound #25 Right,Lateral Lower Leg: Change Dressing every other day. Wound #26 Left,Distal Foot: Change dressing three times week. Skin Barriers/Peri-Wound Care: Other: - TCA to left lower extrimity Wound Cleansing: Clean wound with Normal Saline. May shower with protection. Primary Wound Dressing: Wound #14 Left,Lateral Lower Leg: Iodoflex Wound #23 Right,Lateral Calcaneus: Polymem Silver Wound #25 Right,Lateral Lower Leg: Polymem Silver Wound #26 Left,Distal Foot: Polymem Silver Secondary Dressing: Dry Gauze Other: - apply a triple antibiotic ointment and bandaid to left elbow skin tear. Edema Control: Kerlix and Coban - Bilateral - use  cotton roll not kerlix Avoid standing for long periods of time Elevate legs to the level of the heart or above for 30 minutes daily and/or when sitting, a frequency of: - throughout the day Off-Loading: Multipodus Splint to: - patient to wear bunny boots to both feet while resting in chair and bed. 1. TCA to the left lower extremity 2. I am going to use Iodoflex on the left lateral leg wound but continue polymen Ag everywhere else 3. The patient has a combination of venous insufficiency wounds and probably pressure ulcers. He does not have an arterial issue Electronic Signature(s) Signed: 11/03/2019 5:16:39 PM By: Linton Ham MD Entered By: Linton Ham on 11/03/2019 11:52:51 -------------------------------------------------------------------------------- SuperBill Details Patient Name: Date of Service: [name] [name] F. 11/03/2019 Medical Record Number: [id number] Patient Account Number: [id number] Date of Birth/Sex: Treating RN: 1946-03-20 (74 y.o. [name] Primary Care Provider: [name], [name] Other Clinician: Referring Provider: Treating Provider/Extender: [name] [name]

## 2019-11-09 NOTE — Progress Notes (Signed)
No Muscle: No Joint: No Bone: No Small (1-33%)] [23:Exposed: Yes Fascia: No Tendon: No Muscle: No Joint: No Bone: No Medium (34-66%)] [25:Exposed: Yes Fascia: No Tendon: No Muscle: No Joint: No Bone: No Small (1-33%)] Epithelialization: [14:Debridement - Excisional] [23:N/A] [25:N/A] Debridement: Pre-procedure Verification/Time Out 11:23 [23:N/A] [25:N/A] Taken: [14:Lidocaine 5% topical ointment] [23:N/A] [25:N/A] Pain Control: [14:Subcutaneous] [23:N/A] [25:N/A] Tissue Debrided: [14:Skin/Subcutaneous Tissue] [23:N/A] [25:N/A] Level: [14:10.3] [23:N/A] [25:N/A] Debridement A (sq cm): [14:rea Curette] [23:N/A] [25:N/A] Instrument: [14:Moderate] [23:N/A] [25:N/A] Bleeding: [14:Pressure] [23:N/A] [25:N/A] Hemostasis A chieved: [14:0] [23:N/A] [25:N/A] Procedural Pain: [14:0] [23:N/A] [25:N/A] Post Procedural Pain: [14:Procedure was tolerated well] [23:N/A] [25:N/A] Debridement Treatment Response: [14:10.3x1x0.2] [23:N/A] [25:N/A] Post Debridement Measurements L x W x D (cm) [14:1.618] [23:N/A] [25:N/A] Post Debridement Volume: (cm) [14:Compression Therapy] [23:Compression Therapy] [25:Compression Therapy] Procedures Performed: [14:Debridement] [23:26] [25:N/A N/A] Photos: [14:No Photos Left, Distal Foot] [23:N/A N/A] [25:N/A N/A] Wound Location: [14:Gradually Appeared] [23:N/A] [25:N/A] Wounding Event: [14:Diabetic Wound/Ulcer of the Lower] [23:N/A] [25:N/A] Primary Etiology: [14:Extremity N/A] [23:N/A] [25:N/A] Secondary Etiology: [14:Anemia, Sleep Apnea, Arrhythmia,] [23:N/A] [25:N/A] Comorbid History: [14:Congestive Heart Failure, Coronary Artery Disease, Hypertension, Myocardial Infarction, Type II Diabetes, Gout, Neuropathy 08/18/2019] [23:N/A] [25:N/A] Date A cquired: [14:11] [23:N/A] [25:N/A] Weeks of Treatment: [14:Open] [23:N/A] [25:N/A] Wound Status: [14:No] [23:N/A] [25:N/A] Clustered Wound:  [14:N/A] [23:N/A] [25:N/A] Clustered Quantity: [14:0.3x0.3x0.1] [23:N/A] [25:N/A] Measurements L x W x D (cm) [14:0.071] [23:N/A] [25:N/A] A (cm) : rea [14:0.007] [23:N/A] [25:N/A] Volume (cm) : [14:87.10%] [23:N/A] [25:N/A] % Reduction in A [14:rea: 87.30%] [23:N/A] [25:N/A] % Reduction in Volume: [14:Grade 2] [23:N/A] [25:N/A] Classification: [14:Medium] [23:N/A] [25:N/A] Exudate A mount: [14:Serosanguineous] [23:N/A] [25:N/A] Exudate Type: [14:red, brown] [23:N/A] [25:N/A] Exudate Color: [14:Flat and Intact] [23:N/A] [25:N/A] Wound Margin: [14:Small (1-33%)] [23:N/A] [25:N/A] Granulation A mount: [14:Red] [23:N/A] [25:N/A] Granulation Quality: [14:Large (67-100%)] [23:N/A] [25:N/A] Necrotic A mount: [14:Eschar, Adherent Slough] [23:N/A] [25:N/A] Necrotic Tissue: [14:Fat Layer (Subcutaneous Tissue)] [23:N/A] [25:N/A] Exposed Structures: [14:Exposed: Yes Fascia: No Tendon: No Muscle: No Joint: No Bone: No Medium (34-66%)] [23:N/A] [25:N/A] Epithelialization: [14:N/A] [23:N/A] [25:N/A] Debridement: [14:N/A] [23:N/A] [25:N/A] Pain Control: [14:N/A] [23:N/A] [25:N/A] Tissue Debrided: [14:N/A] [23:N/A] [25:N/A] Level: [14:N/A] [23:N/A] [25:N/A] Debridement A (sq cm): [14:rea N/A] [23:N/A] [25:N/A] Instrument: [14:N/A] [23:N/A] [25:N/A] Bleeding: [14:N/A] [23:N/A] [25:N/A] Hemostasis A chieved: [14:N/A] [23:N/A] [25:N/A] Procedural Pain: [14:N/A] [23:N/A] [25:N/A] Post Procedural Pain: Debridement Treatment Response: N/A [23:N/A] [25:N/A] Post Debridement Measurements L x N/A [23:N/A] [25:N/A] W x D (cm) [14:N/A] [23:N/A] [25:N/A] Post Debridement Volume: (cm) [14:Compression Therapy] [23:N/A] [25:N/A] Treatment Notes Electronic Signature(s) Signed: 11/03/2019 5:16:39 PM By: Linton Ham MD Signed: 11/03/2019 5:57:23 PM By: [name] RN Entered By: Linton Ham on 11/03/2019  11:45:29 -------------------------------------------------------------------------------- Multi-Disciplinary Care Plan Details Patient Name: Date of Service: [name] [name] F. 11/03/2019 10:15 A M Medical Record Number: [id number] Patient Account Number: [id number] Date of Birth/Sex: Treating RN: 1946/03/22 (74 y.o. [name] [name] Primary Care Everardo Voris: [name], [name] Other Clinician: Referring Doak Mah: Treating Earlean Fidalgo/Extender: [name], [name] [name], [name] Weeks in Treatment: 185 Active Inactive Wound/Skin Impairment Nursing Diagnoses: Impaired tissue integrity Goals: Patient/caregiver will verbalize understanding of skin care regimen Date Initiated: 11/19/2017 Target Resolution Date: 11/04/2019 Goal Status: Active Ulcer/skin breakdown will have a volume reduction of 30% by week 4 Date Initiated: 11/19/2017 Date Inactivated: 12/02/2017 Target Resolution Date: 12/22/2017 Goal Status: Unmet Unmet Reason: multipl comorbidities Interventions: Assess patient/caregiver ability to obtain necessary supplies Assess ulceration(s) every visit Provide education on ulcer and skin care Notes: Electronic Signature(s) Signed: 11/03/2019 5:57:23 PM By: [name] RN Entered By: [name] on  No Muscle: No Joint: No Bone: No Small (1-33%)] [23:Exposed: Yes Fascia: No Tendon: No Muscle: No Joint: No Bone: No Medium (34-66%)] [25:Exposed: Yes Fascia: No Tendon: No Muscle: No Joint: No Bone: No Small (1-33%)] Epithelialization: [14:Debridement - Excisional] [23:N/A] [25:N/A] Debridement: Pre-procedure Verification/Time Out 11:23 [23:N/A] [25:N/A] Taken: [14:Lidocaine 5% topical ointment] [23:N/A] [25:N/A] Pain Control: [14:Subcutaneous] [23:N/A] [25:N/A] Tissue Debrided: [14:Skin/Subcutaneous Tissue] [23:N/A] [25:N/A] Level: [14:10.3] [23:N/A] [25:N/A] Debridement A (sq cm): [14:rea Curette] [23:N/A] [25:N/A] Instrument: [14:Moderate] [23:N/A] [25:N/A] Bleeding: [14:Pressure] [23:N/A] [25:N/A] Hemostasis A chieved: [14:0] [23:N/A] [25:N/A] Procedural Pain: [14:0] [23:N/A] [25:N/A] Post Procedural Pain: [14:Procedure was tolerated well] [23:N/A] [25:N/A] Debridement Treatment Response: [14:10.3x1x0.2] [23:N/A] [25:N/A] Post Debridement Measurements L x W x D (cm) [14:1.618] [23:N/A] [25:N/A] Post Debridement Volume: (cm) [14:Compression Therapy] [23:Compression Therapy] [25:Compression Therapy] Procedures Performed: [14:Debridement] [23:26] [25:N/A N/A] Photos: [14:No Photos Left, Distal Foot] [23:N/A N/A] [25:N/A N/A] Wound Location: [14:Gradually Appeared] [23:N/A] [25:N/A] Wounding Event: [14:Diabetic Wound/Ulcer of the Lower] [23:N/A] [25:N/A] Primary Etiology: [14:Extremity N/A] [23:N/A] [25:N/A] Secondary Etiology: [14:Anemia, Sleep Apnea, Arrhythmia,] [23:N/A] [25:N/A] Comorbid History: [14:Congestive Heart Failure, Coronary Artery Disease, Hypertension, Myocardial Infarction, Type II Diabetes, Gout, Neuropathy 08/18/2019] [23:N/A] [25:N/A] Date A cquired: [14:11] [23:N/A] [25:N/A] Weeks of Treatment: [14:Open] [23:N/A] [25:N/A] Wound Status: [14:No] [23:N/A] [25:N/A] Clustered Wound:  [14:N/A] [23:N/A] [25:N/A] Clustered Quantity: [14:0.3x0.3x0.1] [23:N/A] [25:N/A] Measurements L x W x D (cm) [14:0.071] [23:N/A] [25:N/A] A (cm) : rea [14:0.007] [23:N/A] [25:N/A] Volume (cm) : [14:87.10%] [23:N/A] [25:N/A] % Reduction in A [14:rea: 87.30%] [23:N/A] [25:N/A] % Reduction in Volume: [14:Grade 2] [23:N/A] [25:N/A] Classification: [14:Medium] [23:N/A] [25:N/A] Exudate A mount: [14:Serosanguineous] [23:N/A] [25:N/A] Exudate Type: [14:red, brown] [23:N/A] [25:N/A] Exudate Color: [14:Flat and Intact] [23:N/A] [25:N/A] Wound Margin: [14:Small (1-33%)] [23:N/A] [25:N/A] Granulation A mount: [14:Red] [23:N/A] [25:N/A] Granulation Quality: [14:Large (67-100%)] [23:N/A] [25:N/A] Necrotic A mount: [14:Eschar, Adherent Slough] [23:N/A] [25:N/A] Necrotic Tissue: [14:Fat Layer (Subcutaneous Tissue)] [23:N/A] [25:N/A] Exposed Structures: [14:Exposed: Yes Fascia: No Tendon: No Muscle: No Joint: No Bone: No Medium (34-66%)] [23:N/A] [25:N/A] Epithelialization: [14:N/A] [23:N/A] [25:N/A] Debridement: [14:N/A] [23:N/A] [25:N/A] Pain Control: [14:N/A] [23:N/A] [25:N/A] Tissue Debrided: [14:N/A] [23:N/A] [25:N/A] Level: [14:N/A] [23:N/A] [25:N/A] Debridement A (sq cm): [14:rea N/A] [23:N/A] [25:N/A] Instrument: [14:N/A] [23:N/A] [25:N/A] Bleeding: [14:N/A] [23:N/A] [25:N/A] Hemostasis A chieved: [14:N/A] [23:N/A] [25:N/A] Procedural Pain: [14:N/A] [23:N/A] [25:N/A] Post Procedural Pain: Debridement Treatment Response: N/A [23:N/A] [25:N/A] Post Debridement Measurements L x N/A [23:N/A] [25:N/A] W x D (cm) [14:N/A] [23:N/A] [25:N/A] Post Debridement Volume: (cm) [14:Compression Therapy] [23:N/A] [25:N/A] Treatment Notes Electronic Signature(s) Signed: 11/03/2019 5:16:39 PM By: Linton Ham MD Signed: 11/03/2019 5:57:23 PM By: [name] RN Entered By: Linton Ham on 11/03/2019  11:45:29 -------------------------------------------------------------------------------- Multi-Disciplinary Care Plan Details Patient Name: Date of Service: [name] [name] F. 11/03/2019 10:15 A M Medical Record Number: [id number] Patient Account Number: [id number] Date of Birth/Sex: Treating RN: 1946/03/22 (74 y.o. [name] [name] Primary Care Everardo Voris: [name], [name] Other Clinician: Referring Doak Mah: Treating Earlean Fidalgo/Extender: [name], [name] [name], [name] Weeks in Treatment: 185 Active Inactive Wound/Skin Impairment Nursing Diagnoses: Impaired tissue integrity Goals: Patient/caregiver will verbalize understanding of skin care regimen Date Initiated: 11/19/2017 Target Resolution Date: 11/04/2019 Goal Status: Active Ulcer/skin breakdown will have a volume reduction of 30% by week 4 Date Initiated: 11/19/2017 Date Inactivated: 12/02/2017 Target Resolution Date: 12/22/2017 Goal Status: Unmet Unmet Reason: multipl comorbidities Interventions: Assess patient/caregiver ability to obtain necessary supplies Assess ulceration(s) every visit Provide education on ulcer and skin care Notes: Electronic Signature(s) Signed: 11/03/2019 5:57:23 PM By: [name] RN Entered By: [name] on  Coronary Artery Disease, Hypertension, Myocardial Infarction, Type II Diabetes, Gout, Neuropathy Photos Photo Uploaded By: Mikeal Hawthorne on 11/03/2019 15:32:44 Wound Measurements Length: (cm) 0.7 Width: (cm) 0.4 Depth: (cm) 0.1 Area: (cm) 0.22 Volume: (cm) 0.022 % Reduction in Area: 96.3% % Reduction in Volume: 96.3% Epithelialization: Medium (34-66%) Tunneling: No Undermining: No Wound Description Classification: Grade 2 Wound Margin: Flat and Intact Exudate Amount: Medium Exudate Type: Serosanguineous Exudate Color: red, brown Foul Odor After Cleansing: No Slough/Fibrino Yes Wound Bed Granulation Amount: Large (67-100%) Exposed Structure Granulation Quality: Red Fascia Exposed: No Necrotic Amount: None Present (0%) Fat Layer (Subcutaneous Tissue) Exposed: Yes Tendon Exposed: No Muscle Exposed: No Joint Exposed: No Bone Exposed: No Treatment Notes Wound #23 (Right, Lateral Calcaneus) 1. Cleanse With Soap and water 2. Periwound Care TCA Ointment 3. Primary Dressing Applied Polymem Ag 4. Secondary Dressing Dry Gauze 6. Support Layer Holiday representative) Signed: 11/03/2019 5:18:51 PM By: Kela Millin Signed: 11/03/2019 5:57:23 PM By: [name] RN Entered By: Kela Millin on 11/03/2019 11:13:20 -------------------------------------------------------------------------------- Wound Assessment Details Patient Name: Date of Service: [name] [name] F. 11/03/2019 10:15 A M Medical Record Number: [id number] Patient Account Number: [id number] Date of Birth/Sex: Treating RN: 03-06-1946 (74 y.o. [name] [name]) [name] Primary Care Ottis Sarnowski: [name], [name] Other Clinician: Referring Camron Monday: Treating Sachiko Methot/Extender: [name],  [name] [name], [name] Weeks in Treatment: 185 Wound Status Wound Number: 25 Primary Venous Leg Ulcer Etiology: Wound Location: Right, Lateral Lower Leg Wound Open Wounding Event: Gradually Appeared Status: Date Acquired: 07/06/2019 Comorbid Anemia, Sleep Apnea, Arrhythmia, Congestive Heart Failure, Weeks Of Treatment: 15 History: Coronary Artery Disease, Hypertension, Myocardial Infarction, Clustered Wound: No Type II Diabetes, Gout, Neuropathy Photos Photo Uploaded By: Mikeal Hawthorne on 11/03/2019 15:32:45 Wound Measurements Length: (cm) 0.2 Width: (cm) 0.2 Depth: (cm) 0.1 Area: (cm) 0.031 Volume: (cm) 0.003 % Reduction in Area: 98.9% % Reduction in Volume: 98.9% Epithelialization: Small (1-33%) Tunneling: No Undermining: No Wound Description Classification: Full Thickness Without Exposed Support Structures Wound Margin: Flat and Intact Exudate Amount: Medium Exudate Type: Serosanguineous Exudate Color: red, brown Foul Odor After Cleansing: No Slough/Fibrino Yes Wound Bed Granulation Amount: Large (67-100%) Exposed Structure Granulation Quality: Pink Fascia Exposed: No Necrotic Amount: Small (1-33%) Fat Layer (Subcutaneous Tissue) Exposed: Yes Necrotic Quality: Adherent Slough Tendon Exposed: No Muscle Exposed: No Joint Exposed: No Bone Exposed: No Treatment Notes Wound #25 (Right, Lateral Lower Leg) 1. Cleanse With Soap and water 2. Periwound Care TCA Ointment 3. Primary Dressing Applied Polymem Ag 4. Secondary Dressing Dry Gauze 6. Support Layer Holiday representative) Signed: 11/03/2019 5:18:51 PM By: Kela Millin Signed: 11/03/2019 5:57:23 PM By: [name] RN Entered By: Kela Millin on 11/03/2019 11:13:52 -------------------------------------------------------------------------------- Wound Assessment Details Patient Name: Date of Service: [name] [name] F. 11/03/2019 10:15 A M Medical Record Number:  [id number] Patient Account Number: [id number] Date of Birth/Sex: Treating RN: 12/16/1945 (74 y.o. [name] [name] Primary Care Milton Sagona: [name], [name] Other Clinician: Referring Marshel Golubski: Treating Johanne Mcglade/Extender: [name], [name] [name], [name] Weeks in Treatment: 185 Wound Status Wound Number: 26 Primary Diabetic Wound/Ulcer of the Lower Extremity Etiology: Wound Location: Left, Distal Foot Wound Open Wounding Event: Gradually Appeared Status: Date Acquired: 08/18/2019 Comorbid Anemia, Sleep Apnea, Arrhythmia, Congestive Heart Failure, Weeks Of Treatment: 11 History: Coronary Artery Disease, Hypertension, Myocardial Infarction, Clustered Wound: No Type II Diabetes, Gout, Neuropathy Photos Photo Uploaded By: Mikeal Hawthorne on 11/03/2019 15:32:17 Wound Measurements Length: (cm) 0.3 % Red Width: (cm) 0.3 % Red Depth: (cm) 0.1 Epith Area: (cm) 0.071 Tunn  Coronary Artery Disease, Hypertension, Myocardial Infarction, Type II Diabetes, Gout, Neuropathy Photos Photo Uploaded By: Mikeal Hawthorne on 11/03/2019 15:32:44 Wound Measurements Length: (cm) 0.7 Width: (cm) 0.4 Depth: (cm) 0.1 Area: (cm) 0.22 Volume: (cm) 0.022 % Reduction in Area: 96.3% % Reduction in Volume: 96.3% Epithelialization: Medium (34-66%) Tunneling: No Undermining: No Wound Description Classification: Grade 2 Wound Margin: Flat and Intact Exudate Amount: Medium Exudate Type: Serosanguineous Exudate Color: red, brown Foul Odor After Cleansing: No Slough/Fibrino Yes Wound Bed Granulation Amount: Large (67-100%) Exposed Structure Granulation Quality: Red Fascia Exposed: No Necrotic Amount: None Present (0%) Fat Layer (Subcutaneous Tissue) Exposed: Yes Tendon Exposed: No Muscle Exposed: No Joint Exposed: No Bone Exposed: No Treatment Notes Wound #23 (Right, Lateral Calcaneus) 1. Cleanse With Soap and water 2. Periwound Care TCA Ointment 3. Primary Dressing Applied Polymem Ag 4. Secondary Dressing Dry Gauze 6. Support Layer Holiday representative) Signed: 11/03/2019 5:18:51 PM By: Kela Millin Signed: 11/03/2019 5:57:23 PM By: [name] RN Entered By: Kela Millin on 11/03/2019 11:13:20 -------------------------------------------------------------------------------- Wound Assessment Details Patient Name: Date of Service: [name] [name] F. 11/03/2019 10:15 A M Medical Record Number: [id number] Patient Account Number: [id number] Date of Birth/Sex: Treating RN: 03-06-1946 (74 y.o. [name] [name]) [name] Primary Care Ottis Sarnowski: [name], [name] Other Clinician: Referring Camron Monday: Treating Sachiko Methot/Extender: [name],  [name] [name], [name] Weeks in Treatment: 185 Wound Status Wound Number: 25 Primary Venous Leg Ulcer Etiology: Wound Location: Right, Lateral Lower Leg Wound Open Wounding Event: Gradually Appeared Status: Date Acquired: 07/06/2019 Comorbid Anemia, Sleep Apnea, Arrhythmia, Congestive Heart Failure, Weeks Of Treatment: 15 History: Coronary Artery Disease, Hypertension, Myocardial Infarction, Clustered Wound: No Type II Diabetes, Gout, Neuropathy Photos Photo Uploaded By: Mikeal Hawthorne on 11/03/2019 15:32:45 Wound Measurements Length: (cm) 0.2 Width: (cm) 0.2 Depth: (cm) 0.1 Area: (cm) 0.031 Volume: (cm) 0.003 % Reduction in Area: 98.9% % Reduction in Volume: 98.9% Epithelialization: Small (1-33%) Tunneling: No Undermining: No Wound Description Classification: Full Thickness Without Exposed Support Structures Wound Margin: Flat and Intact Exudate Amount: Medium Exudate Type: Serosanguineous Exudate Color: red, brown Foul Odor After Cleansing: No Slough/Fibrino Yes Wound Bed Granulation Amount: Large (67-100%) Exposed Structure Granulation Quality: Pink Fascia Exposed: No Necrotic Amount: Small (1-33%) Fat Layer (Subcutaneous Tissue) Exposed: Yes Necrotic Quality: Adherent Slough Tendon Exposed: No Muscle Exposed: No Joint Exposed: No Bone Exposed: No Treatment Notes Wound #25 (Right, Lateral Lower Leg) 1. Cleanse With Soap and water 2. Periwound Care TCA Ointment 3. Primary Dressing Applied Polymem Ag 4. Secondary Dressing Dry Gauze 6. Support Layer Holiday representative) Signed: 11/03/2019 5:18:51 PM By: Kela Millin Signed: 11/03/2019 5:57:23 PM By: [name] RN Entered By: Kela Millin on 11/03/2019 11:13:52 -------------------------------------------------------------------------------- Wound Assessment Details Patient Name: Date of Service: [name] [name] F. 11/03/2019 10:15 A M Medical Record Number:  [id number] Patient Account Number: [id number] Date of Birth/Sex: Treating RN: 12/16/1945 (74 y.o. [name] [name] Primary Care Milton Sagona: [name], [name] Other Clinician: Referring Marshel Golubski: Treating Johanne Mcglade/Extender: [name], [name] [name], [name] Weeks in Treatment: 185 Wound Status Wound Number: 26 Primary Diabetic Wound/Ulcer of the Lower Extremity Etiology: Wound Location: Left, Distal Foot Wound Open Wounding Event: Gradually Appeared Status: Date Acquired: 08/18/2019 Comorbid Anemia, Sleep Apnea, Arrhythmia, Congestive Heart Failure, Weeks Of Treatment: 11 History: Coronary Artery Disease, Hypertension, Myocardial Infarction, Clustered Wound: No Type II Diabetes, Gout, Neuropathy Photos Photo Uploaded By: Mikeal Hawthorne on 11/03/2019 15:32:17 Wound Measurements Length: (cm) 0.3 % Red Width: (cm) 0.3 % Red Depth: (cm) 0.1 Epith Area: (cm) 0.071 Tunn  No Muscle: No Joint: No Bone: No Small (1-33%)] [23:Exposed: Yes Fascia: No Tendon: No Muscle: No Joint: No Bone: No Medium (34-66%)] [25:Exposed: Yes Fascia: No Tendon: No Muscle: No Joint: No Bone: No Small (1-33%)] Epithelialization: [14:Debridement - Excisional] [23:N/A] [25:N/A] Debridement: Pre-procedure Verification/Time Out 11:23 [23:N/A] [25:N/A] Taken: [14:Lidocaine 5% topical ointment] [23:N/A] [25:N/A] Pain Control: [14:Subcutaneous] [23:N/A] [25:N/A] Tissue Debrided: [14:Skin/Subcutaneous Tissue] [23:N/A] [25:N/A] Level: [14:10.3] [23:N/A] [25:N/A] Debridement A (sq cm): [14:rea Curette] [23:N/A] [25:N/A] Instrument: [14:Moderate] [23:N/A] [25:N/A] Bleeding: [14:Pressure] [23:N/A] [25:N/A] Hemostasis A chieved: [14:0] [23:N/A] [25:N/A] Procedural Pain: [14:0] [23:N/A] [25:N/A] Post Procedural Pain: [14:Procedure was tolerated well] [23:N/A] [25:N/A] Debridement Treatment Response: [14:10.3x1x0.2] [23:N/A] [25:N/A] Post Debridement Measurements L x W x D (cm) [14:1.618] [23:N/A] [25:N/A] Post Debridement Volume: (cm) [14:Compression Therapy] [23:Compression Therapy] [25:Compression Therapy] Procedures Performed: [14:Debridement] [23:26] [25:N/A N/A] Photos: [14:No Photos Left, Distal Foot] [23:N/A N/A] [25:N/A N/A] Wound Location: [14:Gradually Appeared] [23:N/A] [25:N/A] Wounding Event: [14:Diabetic Wound/Ulcer of the Lower] [23:N/A] [25:N/A] Primary Etiology: [14:Extremity N/A] [23:N/A] [25:N/A] Secondary Etiology: [14:Anemia, Sleep Apnea, Arrhythmia,] [23:N/A] [25:N/A] Comorbid History: [14:Congestive Heart Failure, Coronary Artery Disease, Hypertension, Myocardial Infarction, Type II Diabetes, Gout, Neuropathy 08/18/2019] [23:N/A] [25:N/A] Date A cquired: [14:11] [23:N/A] [25:N/A] Weeks of Treatment: [14:Open] [23:N/A] [25:N/A] Wound Status: [14:No] [23:N/A] [25:N/A] Clustered Wound:  [14:N/A] [23:N/A] [25:N/A] Clustered Quantity: [14:0.3x0.3x0.1] [23:N/A] [25:N/A] Measurements L x W x D (cm) [14:0.071] [23:N/A] [25:N/A] A (cm) : rea [14:0.007] [23:N/A] [25:N/A] Volume (cm) : [14:87.10%] [23:N/A] [25:N/A] % Reduction in A [14:rea: 87.30%] [23:N/A] [25:N/A] % Reduction in Volume: [14:Grade 2] [23:N/A] [25:N/A] Classification: [14:Medium] [23:N/A] [25:N/A] Exudate A mount: [14:Serosanguineous] [23:N/A] [25:N/A] Exudate Type: [14:red, brown] [23:N/A] [25:N/A] Exudate Color: [14:Flat and Intact] [23:N/A] [25:N/A] Wound Margin: [14:Small (1-33%)] [23:N/A] [25:N/A] Granulation A mount: [14:Red] [23:N/A] [25:N/A] Granulation Quality: [14:Large (67-100%)] [23:N/A] [25:N/A] Necrotic A mount: [14:Eschar, Adherent Slough] [23:N/A] [25:N/A] Necrotic Tissue: [14:Fat Layer (Subcutaneous Tissue)] [23:N/A] [25:N/A] Exposed Structures: [14:Exposed: Yes Fascia: No Tendon: No Muscle: No Joint: No Bone: No Medium (34-66%)] [23:N/A] [25:N/A] Epithelialization: [14:N/A] [23:N/A] [25:N/A] Debridement: [14:N/A] [23:N/A] [25:N/A] Pain Control: [14:N/A] [23:N/A] [25:N/A] Tissue Debrided: [14:N/A] [23:N/A] [25:N/A] Level: [14:N/A] [23:N/A] [25:N/A] Debridement A (sq cm): [14:rea N/A] [23:N/A] [25:N/A] Instrument: [14:N/A] [23:N/A] [25:N/A] Bleeding: [14:N/A] [23:N/A] [25:N/A] Hemostasis A chieved: [14:N/A] [23:N/A] [25:N/A] Procedural Pain: [14:N/A] [23:N/A] [25:N/A] Post Procedural Pain: Debridement Treatment Response: N/A [23:N/A] [25:N/A] Post Debridement Measurements L x N/A [23:N/A] [25:N/A] W x D (cm) [14:N/A] [23:N/A] [25:N/A] Post Debridement Volume: (cm) [14:Compression Therapy] [23:N/A] [25:N/A] Treatment Notes Electronic Signature(s) Signed: 11/03/2019 5:16:39 PM By: Linton Ham MD Signed: 11/03/2019 5:57:23 PM By: [name] RN Entered By: Linton Ham on 11/03/2019  11:45:29 -------------------------------------------------------------------------------- Multi-Disciplinary Care Plan Details Patient Name: Date of Service: [name] [name] F. 11/03/2019 10:15 A M Medical Record Number: [id number] Patient Account Number: [id number] Date of Birth/Sex: Treating RN: 1946/03/22 (74 y.o. [name] [name] Primary Care Everardo Voris: [name], [name] Other Clinician: Referring Doak Mah: Treating Earlean Fidalgo/Extender: [name], [name] [name], [name] Weeks in Treatment: 185 Active Inactive Wound/Skin Impairment Nursing Diagnoses: Impaired tissue integrity Goals: Patient/caregiver will verbalize understanding of skin care regimen Date Initiated: 11/19/2017 Target Resolution Date: 11/04/2019 Goal Status: Active Ulcer/skin breakdown will have a volume reduction of 30% by week 4 Date Initiated: 11/19/2017 Date Inactivated: 12/02/2017 Target Resolution Date: 12/22/2017 Goal Status: Unmet Unmet Reason: multipl comorbidities Interventions: Assess patient/caregiver ability to obtain necessary supplies Assess ulceration(s) every visit Provide education on ulcer and skin care Notes: Electronic Signature(s) Signed: 11/03/2019 5:57:23 PM By: [name] RN Entered By: [name] on  No Muscle: No Joint: No Bone: No Small (1-33%)] [23:Exposed: Yes Fascia: No Tendon: No Muscle: No Joint: No Bone: No Medium (34-66%)] [25:Exposed: Yes Fascia: No Tendon: No Muscle: No Joint: No Bone: No Small (1-33%)] Epithelialization: [14:Debridement - Excisional] [23:N/A] [25:N/A] Debridement: Pre-procedure Verification/Time Out 11:23 [23:N/A] [25:N/A] Taken: [14:Lidocaine 5% topical ointment] [23:N/A] [25:N/A] Pain Control: [14:Subcutaneous] [23:N/A] [25:N/A] Tissue Debrided: [14:Skin/Subcutaneous Tissue] [23:N/A] [25:N/A] Level: [14:10.3] [23:N/A] [25:N/A] Debridement A (sq cm): [14:rea Curette] [23:N/A] [25:N/A] Instrument: [14:Moderate] [23:N/A] [25:N/A] Bleeding: [14:Pressure] [23:N/A] [25:N/A] Hemostasis A chieved: [14:0] [23:N/A] [25:N/A] Procedural Pain: [14:0] [23:N/A] [25:N/A] Post Procedural Pain: [14:Procedure was tolerated well] [23:N/A] [25:N/A] Debridement Treatment Response: [14:10.3x1x0.2] [23:N/A] [25:N/A] Post Debridement Measurements L x W x D (cm) [14:1.618] [23:N/A] [25:N/A] Post Debridement Volume: (cm) [14:Compression Therapy] [23:Compression Therapy] [25:Compression Therapy] Procedures Performed: [14:Debridement] [23:26] [25:N/A N/A] Photos: [14:No Photos Left, Distal Foot] [23:N/A N/A] [25:N/A N/A] Wound Location: [14:Gradually Appeared] [23:N/A] [25:N/A] Wounding Event: [14:Diabetic Wound/Ulcer of the Lower] [23:N/A] [25:N/A] Primary Etiology: [14:Extremity N/A] [23:N/A] [25:N/A] Secondary Etiology: [14:Anemia, Sleep Apnea, Arrhythmia,] [23:N/A] [25:N/A] Comorbid History: [14:Congestive Heart Failure, Coronary Artery Disease, Hypertension, Myocardial Infarction, Type II Diabetes, Gout, Neuropathy 08/18/2019] [23:N/A] [25:N/A] Date A cquired: [14:11] [23:N/A] [25:N/A] Weeks of Treatment: [14:Open] [23:N/A] [25:N/A] Wound Status: [14:No] [23:N/A] [25:N/A] Clustered Wound:  [14:N/A] [23:N/A] [25:N/A] Clustered Quantity: [14:0.3x0.3x0.1] [23:N/A] [25:N/A] Measurements L x W x D (cm) [14:0.071] [23:N/A] [25:N/A] A (cm) : rea [14:0.007] [23:N/A] [25:N/A] Volume (cm) : [14:87.10%] [23:N/A] [25:N/A] % Reduction in A [14:rea: 87.30%] [23:N/A] [25:N/A] % Reduction in Volume: [14:Grade 2] [23:N/A] [25:N/A] Classification: [14:Medium] [23:N/A] [25:N/A] Exudate A mount: [14:Serosanguineous] [23:N/A] [25:N/A] Exudate Type: [14:red, brown] [23:N/A] [25:N/A] Exudate Color: [14:Flat and Intact] [23:N/A] [25:N/A] Wound Margin: [14:Small (1-33%)] [23:N/A] [25:N/A] Granulation A mount: [14:Red] [23:N/A] [25:N/A] Granulation Quality: [14:Large (67-100%)] [23:N/A] [25:N/A] Necrotic A mount: [14:Eschar, Adherent Slough] [23:N/A] [25:N/A] Necrotic Tissue: [14:Fat Layer (Subcutaneous Tissue)] [23:N/A] [25:N/A] Exposed Structures: [14:Exposed: Yes Fascia: No Tendon: No Muscle: No Joint: No Bone: No Medium (34-66%)] [23:N/A] [25:N/A] Epithelialization: [14:N/A] [23:N/A] [25:N/A] Debridement: [14:N/A] [23:N/A] [25:N/A] Pain Control: [14:N/A] [23:N/A] [25:N/A] Tissue Debrided: [14:N/A] [23:N/A] [25:N/A] Level: [14:N/A] [23:N/A] [25:N/A] Debridement A (sq cm): [14:rea N/A] [23:N/A] [25:N/A] Instrument: [14:N/A] [23:N/A] [25:N/A] Bleeding: [14:N/A] [23:N/A] [25:N/A] Hemostasis A chieved: [14:N/A] [23:N/A] [25:N/A] Procedural Pain: [14:N/A] [23:N/A] [25:N/A] Post Procedural Pain: Debridement Treatment Response: N/A [23:N/A] [25:N/A] Post Debridement Measurements L x N/A [23:N/A] [25:N/A] W x D (cm) [14:N/A] [23:N/A] [25:N/A] Post Debridement Volume: (cm) [14:Compression Therapy] [23:N/A] [25:N/A] Treatment Notes Electronic Signature(s) Signed: 11/03/2019 5:16:39 PM By: Linton Ham MD Signed: 11/03/2019 5:57:23 PM By: [name] RN Entered By: Linton Ham on 11/03/2019  11:45:29 -------------------------------------------------------------------------------- Multi-Disciplinary Care Plan Details Patient Name: Date of Service: [name] [name] F. 11/03/2019 10:15 A M Medical Record Number: [id number] Patient Account Number: [id number] Date of Birth/Sex: Treating RN: 1946/03/22 (74 y.o. [name] [name] Primary Care Everardo Voris: [name], [name] Other Clinician: Referring Doak Mah: Treating Earlean Fidalgo/Extender: [name], [name] [name], [name] Weeks in Treatment: 185 Active Inactive Wound/Skin Impairment Nursing Diagnoses: Impaired tissue integrity Goals: Patient/caregiver will verbalize understanding of skin care regimen Date Initiated: 11/19/2017 Target Resolution Date: 11/04/2019 Goal Status: Active Ulcer/skin breakdown will have a volume reduction of 30% by week 4 Date Initiated: 11/19/2017 Date Inactivated: 12/02/2017 Target Resolution Date: 12/22/2017 Goal Status: Unmet Unmet Reason: multipl comorbidities Interventions: Assess patient/caregiver ability to obtain necessary supplies Assess ulceration(s) every visit Provide education on ulcer and skin care Notes: Electronic Signature(s) Signed: 11/03/2019 5:57:23 PM By: [name] RN Entered By: [name] on

## 2019-11-18 ENCOUNTER — Other Ambulatory Visit: Payer: Self-pay | Admitting: Family Medicine

## 2019-11-24 DIAGNOSIS — N312 Flaccid neuropathic bladder, not elsewhere classified: Secondary | ICD-10-CM | POA: Diagnosis not present

## 2019-11-27 ENCOUNTER — Other Ambulatory Visit: Payer: Self-pay | Admitting: Family Medicine

## 2019-12-01 ENCOUNTER — Encounter (HOSPITAL_BASED_OUTPATIENT_CLINIC_OR_DEPARTMENT_OTHER): Payer: Medicare HMO | Attending: Internal Medicine | Admitting: Internal Medicine

## 2019-12-01 DIAGNOSIS — L97521 Non-pressure chronic ulcer of other part of left foot limited to breakdown of skin: Secondary | ICD-10-CM | POA: Diagnosis not present

## 2019-12-01 DIAGNOSIS — L97211 Non-pressure chronic ulcer of right calf limited to breakdown of skin: Secondary | ICD-10-CM | POA: Diagnosis not present

## 2019-12-01 DIAGNOSIS — L89613 Pressure ulcer of right heel, stage 3: Secondary | ICD-10-CM | POA: Diagnosis not present

## 2019-12-01 DIAGNOSIS — E11621 Type 2 diabetes mellitus with foot ulcer: Secondary | ICD-10-CM | POA: Diagnosis not present

## 2019-12-01 DIAGNOSIS — L97812 Non-pressure chronic ulcer of other part of right lower leg with fat layer exposed: Secondary | ICD-10-CM | POA: Diagnosis not present

## 2019-12-01 DIAGNOSIS — L97321 Non-pressure chronic ulcer of left ankle limited to breakdown of skin: Secondary | ICD-10-CM | POA: Insufficient documentation

## 2019-12-01 DIAGNOSIS — L97522 Non-pressure chronic ulcer of other part of left foot with fat layer exposed: Secondary | ICD-10-CM | POA: Diagnosis not present

## 2019-12-01 DIAGNOSIS — L97221 Non-pressure chronic ulcer of left calf limited to breakdown of skin: Secondary | ICD-10-CM | POA: Diagnosis not present

## 2019-12-01 DIAGNOSIS — L97822 Non-pressure chronic ulcer of other part of left lower leg with fat layer exposed: Secondary | ICD-10-CM | POA: Diagnosis not present

## 2019-12-01 NOTE — Progress Notes (Signed)
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 lateral mid tibia. Necrotic debris on the surface. I really do not completely understand this wound. Not making any progress. We have been using PolyMem Ag to all the wounds 8/10-Patient returns at 1 month, the left dorsal foot and ankle wounds are about the same, the left lateral wound on the leg definitely bigger, using PolyMem to all the wounds except the left lateral leg wound where Iodoflex is being tried. Overall these wounds seem to fluctuate in the progress with no clear resolution Electronic Signature(s) Signed: 12/01/2019 10:34:28 AM By: Tobi Bastos MD, MBA Entered By: Tobi Bastos on 12/01/2019  10:34:27 -------------------------------------------------------------------------------- Physical Exam Details Patient Name: Date of Service: [name] Foley F. 12/01/2019 10:15 A M Medical Record Number: [id number] Patient Account Number: [id number] Date of Birth/Sex: Treating RN: [birth year] (74 y.o. [name] Primary Care Provider: [name], [name] Other Clinician: Referring Provider: Treating Provider/Extender: Marykay Mccleod [name], [name] Weeks in Treatment: 189 Constitutional alert and oriented x 3. sitting or standing blood pressure is within target range for patient.. supine blood pressure is within target range for patient.. pulse regular and within target range for patient.Marland Kitchen respirations regular, non-labored and within target range for patient.Marland Kitchen temperature within target range for patient.. . . Well- nourished and well-hydrated in no acute distress. Notes -Left lateral leg wound has minimal hypergranulation in the midportion the rest of the wound mostly has pale granulation at base, somewhat rolled edges Left dorsal foot  granulation within the wound bed. There is a medium (34-66%) amount of necrotic tissue within the wound bed including Adherent Slough. Wound #26 status is Open. Original cause of wound was Gradually Appeared. The wound is located on the Left,Distal Foot. The wound measures 6.9cm length x 2.5cm width x 0.2cm depth; 13.548cm^2 area and 2.71cm^3 volume. There is Fat Layer (Subcutaneous Tissue) Exposed exposed. There is no tunneling or undermining noted. There is a medium amount of serosanguineous drainage noted. The wound margin is flat and intact. There is medium (34-66%) red granulation within the wound bed. There is a medium (34-66%) amount of necrotic tissue within the wound bed including Adherent Slough. 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 Dressing Change Frequency: Wound #14 Left,Lateral Lower Leg: Change dressing every day. Wound #23 Right,Lateral Calcaneus: Change dressing every  day. Wound #25 Right,Lateral Lower Leg: Change dressing every day. Wound #26 Left,Distal Foot: Change dressing every day. Change dressing every day. Wound #26 Left,Distal Foot: Change dressing every day. Change dressing every day. Skin Barriers/Peri-Wound Care: Other: - TCA to left lower extrimity in clinic Wound Cleansing: Clean wound with Normal Saline. May shower and wash wound with soap and water. Primary Wound Dressing: Wound #14 Left,Lateral Lower Leg: Other: - bactrotracin ointment with saline moist gauze over wound bed Wound #23 Right,Lateral Calcaneus: Other: - bactrotracin ointment with saline moist gauze over wound bed Wound #25 Right,Lateral Lower Leg: Other: - bactrotracin ointment with saline moist gauze over wound bed Wound #26 Left,Distal Foot: Other: - bactrotracin ointment with saline moist gauze over wound bed Other: - bactrotracin ointment with saline moist gauze over wound bed Wound #26 Left,Distal Foot: Other: - bactrotracin ointment with saline moist gauze over wound bed Other: - bactrotracin ointment with saline moist gauze over wound bed Secondary Dressing: Wound #14 Left,Lateral Lower Leg: Dry Gauze Wound #23 Right,Lateral Calcaneus: Dry Gauze Wound #25 Right,Lateral Lower Leg: Dry Gauze Wound #26 Left,Distal Foot: Dry Gauze Dry Gauze Wound #26 Left,Distal Foot: Dry Gauze Dry Gauze Edema Control: Kerlix and Coban - Bilateral - use cotton roll not kerlix LIGHTLY Avoid standing for long periods of time Elevate legs to the level of the heart or above for 30 minutes daily and/or when sitting, a frequency of: - throughout the day Off-Loading: Multipodus Splint to: - patient to wear bunny boots to both feet while resting in chair and bed. -Given that multiple different dressing types have been changed and tried and the wounds continue to not completely resolved at this time we can try wet-to- dry daily changes for a week to see if that offers any  benefits -We will use bacitracin to the wound and TCA to the periwound -Return to clinic next week Electronic Signature(s) Signed: 12/01/2019 10:36:18 AM By: Tobi Bastos MD, MBA Entered By: Tobi Bastos on 12/01/2019 10:36:18 -------------------------------------------------------------------------------- SuperBill Details Patient Name: Date of Service: [name] Foley F. 12/01/2019 Medical Record Number: [id number] Patient Account Number: [id number] Date of Birth/Sex: Treating RN: Nov 01, 1945 (74 y.o. [name] Primary Care Provider: [name], [name] Other Clinician: Referring Provider: Treating Provider/Extender: Mar Walmer [name], [name] Weeks in Treatment: 189 Diagnosis Coding ICD-10 Codes Code Description [phone]  reason behind the nonhealing here and ulcer recurrence is really never been clear to me. The patient is debilitated. He uses bunny boots apparently at night to protect his heels and feet. His arterial studies have previously not suggested a primary arterial etiology 4/27 2-week follow-up. He has a new wound on the right anterior patella currently trauma against the wheelchair. Failing this had he has areas on the right lateral calf and right lateral calcaneus both of which we have been using Santyl. On the left side one of the original wounds on the left lateral tibia area, left calcaneus, punched out areas on the left dorsal foot. On the left we have been using  PolyMem Ag 5/11; 2-week follow-up. He does not have any new wounds this week and most of his wounds look better. Using Santyl on the right lateral heel and right lateral ankle and polymen Ag and everything else under compression. 09/15/19-Patient at 2-week follow-up has to the wounds about the same and the rest of the wounds smaller in dimensions, the left posterior wound is about the same, one of the wounds is healed on the right knee. 6/8; 2-week follow-up. His left lateral calf wound is now split into 2. He has an area on the left heel which is a pressure ulcer which I think is improving he also had the wrap injury areas on the crease of his left ankle/foot that are better. Still an area on the right heel that is also improved we have been using PolyMem Ag 6/22; 2-week follow-up. The left lateral calf wound measures about the same. Left heel is closed Right heel still open but looks better. Assorted wounds on the dorsal foot and ankle 1 of these requires debridement small wound with some depth. We have been using PolyMem Ag to all wound areas 7/13; Left heel is closed Right heel is open but looks smaller Small area on the right lateral calf looks smaller Smattering of wounds on the dorsal left foot and ankle. All of these are superficial The substantial area on the left lateral mid tibia. Necrotic debris on the surface. I really do not completely understand this wound. Not making any progress. We have been using PolyMem Ag to all the wounds 8/10-Patient returns at 1 month, the left dorsal foot and ankle wounds are about the same, the left lateral wound on the leg definitely bigger, using PolyMem to all the wounds except the left lateral leg wound where Iodoflex is being tried. Overall these wounds seem to fluctuate in the progress with no clear resolution Electronic Signature(s) Signed: 12/01/2019 10:34:28 AM By: Tobi Bastos MD, MBA Entered By: Tobi Bastos on 12/01/2019  10:34:27 -------------------------------------------------------------------------------- Physical Exam Details Patient Name: Date of Service: [name] Foley F. 12/01/2019 10:15 A M Medical Record Number: [id number] Patient Account Number: [id number] Date of Birth/Sex: Treating RN: [birth year] (74 y.o. [name] Primary Care Provider: [name], [name] Other Clinician: Referring Provider: Treating Provider/Extender: Marykay Mccleod [name], [name] Weeks in Treatment: 189 Constitutional alert and oriented x 3. sitting or standing blood pressure is within target range for patient.. supine blood pressure is within target range for patient.. pulse regular and within target range for patient.Marland Kitchen respirations regular, non-labored and within target range for patient.Marland Kitchen temperature within target range for patient.. . . Well- nourished and well-hydrated in no acute distress. Notes -Left lateral leg wound has minimal hypergranulation in the midportion the rest of the wound mostly has pale granulation at base, somewhat rolled edges Left dorsal foot  Provider: Treating Provider/Extender: Leighanne Adolph [name], [name] Weeks in Treatment: 189 Active Problems ICD-10 Encounter Code Description Active Date MDM Diagnosis L89.613 Pressure ulcer of right heel, stage 3 04/17/2016 No Yes L97.221 Non-pressure chronic ulcer of left calf limited to breakdown of skin 08/15/2017 No Yes L97.321 Non-pressure chronic ulcer of left ankle limited to breakdown of skin 07/01/2018 No Yes L97.521 Non-pressure chronic ulcer of other part of left foot limited to breakdown of 02/17/2019 No Yes skin L97.211 Non-pressure chronic ulcer of  right calf limited to breakdown of skin 07/21/2019 No Yes Inactive Problems ICD-10 Code Description Active Date Inactive Date E11.621 Type 2 diabetes mellitus with foot ulcer 04/17/2016 04/17/2016 L03.032 Cellulitis of left toe 01/06/2019 01/06/2019 J47.829 Pressure ulcer of left heel, stage 3 12/04/2016 12/04/2016 I25.119 Atherosclerotic heart disease of native coronary artery with unspecified angina 04/17/2016 04/17/2016 pectoris L97.821 Non-pressure chronic ulcer of other part of left lower leg limited to breakdown of skin 01/06/2019 01/06/2019 S51.811D Laceration without foreign body of right forearm, subsequent encounter 10/22/2017 10/22/2017 F62.13 Acute diastolic (congestive) heart failure 04/17/2016 04/17/2016 L03.116 Cellulitis of left lower limb 12/24/2017 12/24/2017 L89.620 Pressure ulcer of left heel, unstageable 07/21/2019 07/21/2019 S80.211D Abrasion, right knee, subsequent encounter 08/18/2019 08/18/2019 Resolved Problems ICD-10 Code Description Active Date Resolved Date L89.512 Pressure ulcer of right ankle, stage 2 04/17/2016 04/17/2016 Y86.578 Pressure ulcer of left ankle, stage 2 04/17/2016 04/17/2016 Electronic Signature(s) Signed: 12/01/2019 4:52:06 PM By: Tobi Bastos MD, MBA Signed: 12/01/2019 5:05:11 PM By: [name] RN Entered By: [name] on 12/01/2019 10:21:55 -------------------------------------------------------------------------------- Progress Note Details Patient Name: Date of Service: [name] Foley F. 12/01/2019 10:15 A M Medical Record Number: [id number] Patient Account Number: [id number] Date of Birth/Sex: Treating RN: 1945-09-13 (74 y.o. [name] Primary Care Provider: [name], [name] Other Clinician: Referring Provider: Treating Provider/Extender: Raymont Andreoni [name], [name] Weeks in Treatment: 189 Subjective History of Present Illness (HPI) The following HPI elements were documented for the patient's wound: Location: On the left and right  lateral forefoot which has been there for about 6 months Quality: Patient reports No Pain. Severity: Patient states wound(s) are getting worse. Duration: Patient has had the wound for > 6 months prior to seeking treatment at the wound center Context: The wound would happen gradually Modifying Factors: Patient wound(s)/ulcer(s) are worsening due to :continual drainage from the wound Associated Signs and Symptoms: Patient reports having increase discharge. This patient returns after being seen here till the end of August and he was lost to follow-up. he has been quite debilitated laying in bed most of the time and his condition has deteriorated significantly. He has multiple ulcerations on the heel lateral forefoot and some of his toes. ======== Old notes: 74 year old male known to our practice when he was seen here in February and March and was lost to follow-up when he was admitted to hospital with various medical problems including coronary artery disease and a stroke. Now returns with the problem on the left forefoot where he has an ulceration and this has been there for about 6 months. most recently he was in hospital between July 6 and July 16, when he was admitted and treated for acute respiratory failure is secondary to aspiration pneumonia, large non-STEMI, ischemic cardiomyopathy with systolic and diastolic congestive heart failure with ejection fraction about 15-20%, ventricular tachycardia and has been treated with amiodarone, acute on careful up at the common new acute CVA, acute chronic kidney disease stage III, anemia, uncontrolled diabetes mellitus with last hemoglobin A1c being  reason behind the nonhealing here and ulcer recurrence is really never been clear to me. The patient is debilitated. He uses bunny boots apparently at night to protect his heels and feet. His arterial studies have previously not suggested a primary arterial etiology 4/27 2-week follow-up. He has a new wound on the right anterior patella currently trauma against the wheelchair. Failing this had he has areas on the right lateral calf and right lateral calcaneus both of which we have been using Santyl. On the left side one of the original wounds on the left lateral tibia area, left calcaneus, punched out areas on the left dorsal foot. On the left we have been using  PolyMem Ag 5/11; 2-week follow-up. He does not have any new wounds this week and most of his wounds look better. Using Santyl on the right lateral heel and right lateral ankle and polymen Ag and everything else under compression. 09/15/19-Patient at 2-week follow-up has to the wounds about the same and the rest of the wounds smaller in dimensions, the left posterior wound is about the same, one of the wounds is healed on the right knee. 6/8; 2-week follow-up. His left lateral calf wound is now split into 2. He has an area on the left heel which is a pressure ulcer which I think is improving he also had the wrap injury areas on the crease of his left ankle/foot that are better. Still an area on the right heel that is also improved we have been using PolyMem Ag 6/22; 2-week follow-up. The left lateral calf wound measures about the same. Left heel is closed Right heel still open but looks better. Assorted wounds on the dorsal foot and ankle 1 of these requires debridement small wound with some depth. We have been using PolyMem Ag to all wound areas 7/13; Left heel is closed Right heel is open but looks smaller Small area on the right lateral calf looks smaller [place] of wounds on the dorsal left foot and ankle. All of these are superficial The substantial area on the left lateral mid tibia. Necrotic debris on the surface. I really do not completely understand this wound. Not making any progress. We have been using PolyMem Ag to all the wounds 8/10-Patient returns at 1 month, the left dorsal foot and ankle wounds are about the same, the left lateral wound on the leg definitely bigger, using PolyMem to all the wounds except the left lateral leg wound where Iodoflex is being tried. Overall these wounds seem to fluctuate in the progress with no clear resolution Electronic Signature(s) Signed: 12/01/2019 10:34:28 AM By: Tobi Bastos MD, MBA Entered By: Tobi Bastos on 12/01/2019  10:34:27 -------------------------------------------------------------------------------- Physical Exam Details Patient Name: Date of Service: [name] Foley F. 12/01/2019 10:15 A M Medical Record Number: [id number] Patient Account Number: [id number] Date of Birth/Sex: Treating RN: [birth year] (74 y.o. [name] Primary Care Provider: [name], [name] Other Clinician: Referring Provider: Treating Provider/Extender: Marykay Mccleod [name], [name] Weeks in Treatment: 189 Constitutional alert and oriented x 3. sitting or standing blood pressure is within target range for patient.. supine blood pressure is within target range for patient.. pulse regular and within target range for patient.Marland Kitchen respirations regular, non-labored and within target range for patient.Marland Kitchen temperature within target range for patient.. . . Well- nourished and well-hydrated in no acute distress. Notes -Left lateral leg wound has minimal hypergranulation in the midportion the rest of the wound mostly has pale granulation at base, somewhat rolled edges Left dorsal foot  granulation within the wound bed. There is a medium (34-66%) amount of necrotic tissue within the wound bed including Adherent Slough. Wound #26 status is Open. Original cause of wound was Gradually Appeared. The wound is located on the Left,Distal Foot. The wound measures 6.9cm length x 2.5cm width x 0.2cm depth; 13.548cm^2 area and 2.71cm^3 volume. There is Fat Layer (Subcutaneous Tissue) Exposed exposed. There is no tunneling or undermining noted. There is a medium amount of serosanguineous drainage noted. The wound margin is flat and intact. There is medium (34-66%) red granulation within the wound bed. There is a medium (34-66%) amount of necrotic tissue within the wound bed including Adherent Slough. 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 Dressing Change Frequency: Wound #14 Left,Lateral Lower Leg: Change dressing every day. Wound #23 Right,Lateral Calcaneus: Change dressing every  day. Wound #25 Right,Lateral Lower Leg: Change dressing every day. Wound #26 Left,Distal Foot: Change dressing every day. Change dressing every day. Wound #26 Left,Distal Foot: Change dressing every day. Change dressing every day. Skin Barriers/Peri-Wound Care: Other: - TCA to left lower extrimity in clinic Wound Cleansing: Clean wound with Normal Saline. May shower and wash wound with soap and water. Primary Wound Dressing: Wound #14 Left,Lateral Lower Leg: Other: - bactrotracin ointment with saline moist gauze over wound bed Wound #23 Right,Lateral Calcaneus: Other: - bactrotracin ointment with saline moist gauze over wound bed Wound #25 Right,Lateral Lower Leg: Other: - bactrotracin ointment with saline moist gauze over wound bed Wound #26 Left,Distal Foot: Other: - bactrotracin ointment with saline moist gauze over wound bed Other: - bactrotracin ointment with saline moist gauze over wound bed Wound #26 Left,Distal Foot: Other: - bactrotracin ointment with saline moist gauze over wound bed Other: - bactrotracin ointment with saline moist gauze over wound bed Secondary Dressing: Wound #14 Left,Lateral Lower Leg: Dry Gauze Wound #23 Right,Lateral Calcaneus: Dry Gauze Wound #25 Right,Lateral Lower Leg: Dry Gauze Wound #26 Left,Distal Foot: Dry Gauze Dry Gauze Wound #26 Left,Distal Foot: Dry Gauze Dry Gauze Edema Control: Kerlix and Coban - Bilateral - use cotton roll not kerlix LIGHTLY Avoid standing for long periods of time Elevate legs to the level of the heart or above for 30 minutes daily and/or when sitting, a frequency of: - throughout the day Off-Loading: Multipodus Splint to: - patient to wear bunny boots to both feet while resting in chair and bed. -Given that multiple different dressing types have been changed and tried and the wounds continue to not completely resolved at this time we can try wet-to- dry daily changes for a week to see if that offers any  benefits -We will use bacitracin to the wound and TCA to the periwound -Return to clinic next week Electronic Signature(s) Signed: 12/01/2019 10:36:18 AM By: Tobi Bastos MD, MBA Entered By: Tobi Bastos on 12/01/2019 10:36:18 -------------------------------------------------------------------------------- SuperBill Details Patient Name: Date of Service: [name] Foley F. 12/01/2019 Medical Record Number: [id number] Patient Account Number: [id number] Date of Birth/Sex: Treating RN: Nov 01, 1945 (74 y.o. [name] Primary Care Provider: [name], [name] Other Clinician: Referring Provider: Treating Provider/Extender: Mar Walmer [name], [name] Weeks in Treatment: 189 Diagnosis Coding ICD-10 Codes Code Description [phone]  granulation within the wound bed. There is a medium (34-66%) amount of necrotic tissue within the wound bed including Adherent Slough. Wound #26 status is Open. Original cause of wound was Gradually Appeared. The wound is located on the Left,Distal Foot. The wound measures 6.9cm length x 2.5cm width x 0.2cm depth; 13.548cm^2 area and 2.71cm^3 volume. There is Fat Layer (Subcutaneous Tissue) Exposed exposed. There is no tunneling or undermining noted. There is a medium amount of serosanguineous drainage noted. The wound margin is flat and intact. There is medium (34-66%) red granulation within the wound bed. There is a medium (34-66%) amount of necrotic tissue within the wound bed including Adherent Slough. 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 Dressing Change Frequency: Wound #14 Left,Lateral Lower Leg: Change dressing every day. Wound #23 Right,Lateral Calcaneus: Change dressing every  day. Wound #25 Right,Lateral Lower Leg: Change dressing every day. Wound #26 Left,Distal Foot: Change dressing every day. Change dressing every day. Wound #26 Left,Distal Foot: Change dressing every day. Change dressing every day. Skin Barriers/Peri-Wound Care: Other: - TCA to left lower extrimity in clinic Wound Cleansing: Clean wound with Normal Saline. May shower and wash wound with soap and water. Primary Wound Dressing: Wound #14 Left,Lateral Lower Leg: Other: - bactrotracin ointment with saline moist gauze over wound bed Wound #23 Right,Lateral Calcaneus: Other: - bactrotracin ointment with saline moist gauze over wound bed Wound #25 Right,Lateral Lower Leg: Other: - bactrotracin ointment with saline moist gauze over wound bed Wound #26 Left,Distal Foot: Other: - bactrotracin ointment with saline moist gauze over wound bed Other: - bactrotracin ointment with saline moist gauze over wound bed Wound #26 Left,Distal Foot: Other: - bactrotracin ointment with saline moist gauze over wound bed Other: - bactrotracin ointment with saline moist gauze over wound bed Secondary Dressing: Wound #14 Left,Lateral Lower Leg: Dry Gauze Wound #23 Right,Lateral Calcaneus: Dry Gauze Wound #25 Right,Lateral Lower Leg: Dry Gauze Wound #26 Left,Distal Foot: Dry Gauze Dry Gauze Wound #26 Left,Distal Foot: Dry Gauze Dry Gauze Edema Control: Kerlix and Coban - Bilateral - use cotton roll not kerlix LIGHTLY Avoid standing for long periods of time Elevate legs to the level of the heart or above for 30 minutes daily and/or when sitting, a frequency of: - throughout the day Off-Loading: Multipodus Splint to: - patient to wear bunny boots to both feet while resting in chair and bed. -Given that multiple different dressing types have been changed and tried and the wounds continue to not completely resolved at this time we can try wet-to- dry daily changes for a week to see if that offers any  benefits -We will use bacitracin to the wound and TCA to the periwound -Return to clinic next week Electronic Signature(s) Signed: 12/01/2019 10:36:18 AM By: Tobi Bastos MD, MBA Entered By: Tobi Bastos on 12/01/2019 10:36:18 -------------------------------------------------------------------------------- SuperBill Details Patient Name: Date of Service: [name] Foley F. 12/01/2019 Medical Record Number: [id number] Patient Account Number: [id number] Date of Birth/Sex: Treating RN: Nov 01, 1945 (74 y.o. [name] Primary Care Provider: [name], [name] Other Clinician: Referring Provider: Treating Provider/Extender: Mar Walmer [name], [name] Weeks in Treatment: 189 Diagnosis Coding ICD-10 Codes Code Description [phone]  granulation within the wound bed. There is a medium (34-66%) amount of necrotic tissue within the wound bed including Adherent Slough. Wound #26 status is Open. Original cause of wound was Gradually Appeared. The wound is located on the Left,Distal Foot. The wound measures 6.9cm length x 2.5cm width x 0.2cm depth; 13.548cm^2 area and 2.71cm^3 volume. There is Fat Layer (Subcutaneous Tissue) Exposed exposed. There is no tunneling or undermining noted. There is a medium amount of serosanguineous drainage noted. The wound margin is flat and intact. There is medium (34-66%) red granulation within the wound bed. There is a medium (34-66%) amount of necrotic tissue within the wound bed including Adherent Slough. 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 Dressing Change Frequency: Wound #14 Left,Lateral Lower Leg: Change dressing every day. Wound #23 Right,Lateral Calcaneus: Change dressing every  day. Wound #25 Right,Lateral Lower Leg: Change dressing every day. Wound #26 Left,Distal Foot: Change dressing every day. Change dressing every day. Wound #26 Left,Distal Foot: Change dressing every day. Change dressing every day. Skin Barriers/Peri-Wound Care: Other: - TCA to left lower extrimity in clinic Wound Cleansing: Clean wound with Normal Saline. May shower and wash wound with soap and water. Primary Wound Dressing: Wound #14 Left,Lateral Lower Leg: Other: - bactrotracin ointment with saline moist gauze over wound bed Wound #23 Right,Lateral Calcaneus: Other: - bactrotracin ointment with saline moist gauze over wound bed Wound #25 Right,Lateral Lower Leg: Other: - bactrotracin ointment with saline moist gauze over wound bed Wound #26 Left,Distal Foot: Other: - bactrotracin ointment with saline moist gauze over wound bed Other: - bactrotracin ointment with saline moist gauze over wound bed Wound #26 Left,Distal Foot: Other: - bactrotracin ointment with saline moist gauze over wound bed Other: - bactrotracin ointment with saline moist gauze over wound bed Secondary Dressing: Wound #14 Left,Lateral Lower Leg: Dry Gauze Wound #23 Right,Lateral Calcaneus: Dry Gauze Wound #25 Right,Lateral Lower Leg: Dry Gauze Wound #26 Left,Distal Foot: Dry Gauze Dry Gauze Wound #26 Left,Distal Foot: Dry Gauze Dry Gauze Edema Control: Kerlix and Coban - Bilateral - use cotton roll not kerlix LIGHTLY Avoid standing for long periods of time Elevate legs to the level of the heart or above for 30 minutes daily and/or when sitting, a frequency of: - throughout the day Off-Loading: Multipodus Splint to: - patient to wear bunny boots to both feet while resting in chair and bed. -Given that multiple different dressing types have been changed and tried and the wounds continue to not completely resolved at this time we can try wet-to- dry daily changes for a week to see if that offers any  benefits -We will use bacitracin to the wound and TCA to the periwound -Return to clinic next week Electronic Signature(s) Signed: 12/01/2019 10:36:18 AM By: Tobi Bastos MD, MBA Entered By: Tobi Bastos on 12/01/2019 10:36:18 -------------------------------------------------------------------------------- SuperBill Details Patient Name: Date of Service: [name] Foley F. 12/01/2019 Medical Record Number: [id number] Patient Account Number: [id number] Date of Birth/Sex: Treating RN: Nov 01, 1945 (74 y.o. [name] Primary Care Provider: [name], [name] Other Clinician: Referring Provider: Treating Provider/Extender: Mar Walmer [name], [name] Weeks in Treatment: 189 Diagnosis Coding ICD-10 Codes Code Description [phone]

## 2019-12-01 NOTE — Progress Notes (Signed)
[name] [name], [name] [name] ([id number]) Visit Report for 12/01/2019 Arrival Information Details Patient Name: Date of Service: [name] [name] [facility] F. 12/01/2019 10:15 A M Medical Record Number: [id number] Patient Account Number: [id number] Date of Birth/Sex: Treating RN: 1945/10/06 (74 y.o. [name] [name] Primary Care Eustacia Urbanek: [name], [name] Other Clinician: Referring Lonzie Simmer: Treating Nate Common/Extender: Madduri, Murthy [name], [name] Weeks in Treatment: 189 Visit Information History Since Last Visit Added or deleted any medications: No Patient Arrived: Wheel Chair Any new allergies or adverse reactions: No Arrival Time: 09:54 Had a fall or experienced change in No Accompanied By: dgt activities of daily living that may affect Transfer Assistance: Transfer Board risk of falls: Patient Requires Transmission-Based Precautions: No Signs or symptoms of abuse/neglect since last visito No Patient Has Alerts: Yes Hospitalized since last visit: No Patient Alerts: Patient on Blood Thinner Implantable device outside of the clinic excluding No left ABI 1.0; rt ABI 1.09 cellular tissue based products placed in the center since last visit: Has Dressing in Place as Prescribed: Yes Has Compression in Place as Prescribed: Yes Pain Present Now: No Electronic Signature(s) Signed: 12/01/2019 5:26:04 PM By: Baruch Gouty RN, BSN Entered By: Baruch Gouty on 12/01/2019 10:00:58 -------------------------------------------------------------------------------- Clinic Level of Care Assessment Details Patient Name: Date of Service: [name] [name] F. 12/01/2019 10:15 A M Medical Record Number: [id number] Patient Account Number: [id number] Date of Birth/Sex: Treating RN: [birth year] (74 y.o. [name] [name]) [name] [name] Primary Care Rudell Ortman: [name], [name] Other Clinician: Referring Odelle Kosier: Treating Chaden Doom/Extender: Madduri, Murthy [name], [name] Weeks in Treatment: 189 Clinic Level of Care Assessment Items TOOL  4 Quantity Score []  - 0 Use when only an EandM is performed on FOLLOW-UP visit ASSESSMENTS - Nursing Assessment / Reassessment []  - 0 Reassessment of Co-morbidities (includes updates in patient status) []  - 0 Reassessment of Adherence to Treatment Plan ASSESSMENTS - Wound and Skin A ssessment / Reassessment []  - 0 Simple Wound Assessment / Reassessment - one wound X- 5 5 Complex Wound Assessment / Reassessment - multiple wounds []  - 0 Dermatologic / Skin Assessment (not related to wound area) ASSESSMENTS - Focused Assessment []  - 0 Circumferential Edema Measurements - multi extremities []  - 0 Nutritional Assessment / Counseling / Intervention []  - 0 Lower Extremity Assessment (monofilament, tuning fork, pulses) []  - 0 Peripheral Arterial Disease Assessment (using hand held doppler) ASSESSMENTS - Ostomy and/or Continence Assessment and Care []  - 0 Incontinence Assessment and Management []  - 0 Ostomy Care Assessment and Management (repouching, etc.) PROCESS - Coordination of Care X - Simple Patient / Family Education for ongoing care 1 15 []  - 0 Complex (extensive) Patient / Family Education for ongoing care X- 1 10 Staff obtains Programmer, systems, Records, T Results / Process Orders est []  - 0 Staff telephones HHA, Nursing Homes / Clarify orders / etc []  - 0 Routine Transfer to another Facility (non-emergent condition) []  - 0 Routine Hospital Admission (non-emergent condition) []  - 0 New Admissions / Biomedical engineer / Ordering NPWT Apligraf, etc. , []  - 0 Emergency Hospital Admission (emergent condition) X- 1 10 Simple Discharge Coordination []  - 0 Complex (extensive) Discharge Coordination PROCESS - Special Needs []  - 0 Pediatric / Minor Patient Management []  - 0 Isolation Patient Management []  - 0 Hearing / Language / Visual special needs []  - 0 Assessment of Community assistance (transportation, D/C planning, etc.) []  - 0 Additional assistance / Altered  mentation []  - 0 Support Surface(s) Assessment (bed, cushion, seat, etc.) INTERVENTIONS - Wound Cleansing / Measurement []  - 0 Simple  [name] [name], [name] [name] ([id number]) Visit Report for 12/01/2019 Arrival Information Details Patient Name: Date of Service: [name] [name] [facility] F. 12/01/2019 10:15 A M Medical Record Number: [id number] Patient Account Number: [id number] Date of Birth/Sex: Treating RN: 1945/10/06 (74 y.o. [name] [name] Primary Care Eustacia Urbanek: [name], [name] Other Clinician: Referring Lonzie Simmer: Treating Nate Common/Extender: Madduri, Murthy [name], [name] Weeks in Treatment: 189 Visit Information History Since Last Visit Added or deleted any medications: No Patient Arrived: Wheel Chair Any new allergies or adverse reactions: No Arrival Time: 09:54 Had a fall or experienced change in No Accompanied By: dgt activities of daily living that may affect Transfer Assistance: Transfer Board risk of falls: Patient Requires Transmission-Based Precautions: No Signs or symptoms of abuse/neglect since last visito No Patient Has Alerts: Yes Hospitalized since last visit: No Patient Alerts: Patient on Blood Thinner Implantable device outside of the clinic excluding No left ABI 1.0; rt ABI 1.09 cellular tissue based products placed in the center since last visit: Has Dressing in Place as Prescribed: Yes Has Compression in Place as Prescribed: Yes Pain Present Now: No Electronic Signature(s) Signed: 12/01/2019 5:26:04 PM By: Baruch Gouty RN, BSN Entered By: Baruch Gouty on 12/01/2019 10:00:58 -------------------------------------------------------------------------------- Clinic Level of Care Assessment Details Patient Name: Date of Service: [name] [name] F. 12/01/2019 10:15 A M Medical Record Number: [id number] Patient Account Number: [id number] Date of Birth/Sex: Treating RN: [birth year] (74 y.o. [name] [name]) [name] [name] Primary Care Rudell Ortman: [name], [name] Other Clinician: Referring Odelle Kosier: Treating Chaden Doom/Extender: Madduri, Murthy [name], [name] Weeks in Treatment: 189 Clinic Level of Care Assessment Items TOOL  4 Quantity Score []  - 0 Use when only an EandM is performed on FOLLOW-UP visit ASSESSMENTS - Nursing Assessment / Reassessment []  - 0 Reassessment of Co-morbidities (includes updates in patient status) []  - 0 Reassessment of Adherence to Treatment Plan ASSESSMENTS - Wound and Skin A ssessment / Reassessment []  - 0 Simple Wound Assessment / Reassessment - one wound X- 5 5 Complex Wound Assessment / Reassessment - multiple wounds []  - 0 Dermatologic / Skin Assessment (not related to wound area) ASSESSMENTS - Focused Assessment []  - 0 Circumferential Edema Measurements - multi extremities []  - 0 Nutritional Assessment / Counseling / Intervention []  - 0 Lower Extremity Assessment (monofilament, tuning fork, pulses) []  - 0 Peripheral Arterial Disease Assessment (using hand held doppler) ASSESSMENTS - Ostomy and/or Continence Assessment and Care []  - 0 Incontinence Assessment and Management []  - 0 Ostomy Care Assessment and Management (repouching, etc.) PROCESS - Coordination of Care X - Simple Patient / Family Education for ongoing care 1 15 []  - 0 Complex (extensive) Patient / Family Education for ongoing care X- 1 10 Staff obtains Programmer, systems, Records, T Results / Process Orders est []  - 0 Staff telephones HHA, Nursing Homes / Clarify orders / etc []  - 0 Routine Transfer to another Facility (non-emergent condition) []  - 0 Routine Hospital Admission (non-emergent condition) []  - 0 New Admissions / Biomedical engineer / Ordering NPWT Apligraf, etc. , []  - 0 Emergency Hospital Admission (emergent condition) X- 1 10 Simple Discharge Coordination []  - 0 Complex (extensive) Discharge Coordination PROCESS - Special Needs []  - 0 Pediatric / Minor Patient Management []  - 0 Isolation Patient Management []  - 0 Hearing / Language / Visual special needs []  - 0 Assessment of Community assistance (transportation, D/C planning, etc.) []  - 0 Additional assistance / Altered  mentation []  - 0 Support Surface(s) Assessment (bed, cushion, seat, etc.) INTERVENTIONS - Wound Cleansing / Measurement []  - 0 Simple  13.548 Volume: (cm) 2.71 % Reduction in Area: -2363.3% % Reduction in Volume: -4827.3% Epithelialization: Small (1-33%) Tunneling: No Undermining: No Wound Description Classification: Grade 2 Wound Margin: Flat and Intact Exudate Amount: Medium Exudate Type: Serosanguineous Exudate Color: red, brown Foul Odor After Cleansing: No Slough/Fibrino Yes Wound Bed Granulation Amount: Medium (34-66%) Exposed Structure Granulation Quality: Red Fascia Exposed: No Necrotic Amount: Medium (34-66%) Fat Layer (Subcutaneous Tissue) Exposed: Yes Necrotic Quality: Adherent Slough Tendon Exposed: No Muscle Exposed: No Joint Exposed: No Bone Exposed: No Treatment Notes Wound #26 (Left, Distal Foot) 1. Cleanse With Wound Cleanser Soap and water 3. Primary Dressing Applied Other primary dressing (specifiy in notes) 4. Secondary Dressing Other secondary dressing (specify in notes) 6. Support Layer Applied Kerlix/Coban Notes bacitracin, lightly moistened saline gauze over, cotton instead of kerlix. netting Electronic Signature(s) Signed: 12/01/2019 5:26:04 PM By: Baruch Gouty RN, BSN Entered By: Baruch Gouty on 12/01/2019 10:16:50 -------------------------------------------------------------------------------- Wound Assessment Details Patient Name: Date of Service: [name] [name] F. 12/01/2019 10:15 A M Medical Record Number: [id number] Patient Account Number: [id number] Date of Birth/Sex: Treating RN: March 27, 1946 (74 y.o. [name] [name] Primary Care Latiesha Harada: [name], [name] Other Clinician: Referring Amareon Phung: Treating Chudney Scheffler/Extender: Madduri, Murthy [name], [name] Weeks in Treatment: 189 Wound  Status Wound Number: 26 Primary Diabetic Wound/Ulcer of the Lower Extremity Etiology: Wound Location: Left, Distal Foot Wound Open Wounding Event: Gradually Appeared Status: Date Acquired: 08/18/2019 Comorbid Anemia, Sleep Apnea, Arrhythmia, Congestive Heart Failure, Weeks Of Treatment: 0 History: Coronary Artery Disease, Hypertension, Myocardial Infarction, Clustered Wound: Yes Clustered Wound: Yes Type II Diabetes, Gout, Neuropathy Wound Measurements Length: (cm) 6.9 Width: (cm) 2.5 Depth: (cm) 0.2 Clustered Quantity: 3 Area: (cm) 13.548 Volume: (cm) 2.71 % Reduction in Area: % Reduction in Volume: Epithelialization: Small (1-33%) Tunneling: No Undermining: No Wound Description Classification: Grade 1 Wound Margin: Flat and Intact Exudate Amount: Medium Exudate Type: Serosanguineous Exudate Color: red, brown Foul Odor After Cleansing: No Slough/Fibrino Yes Wound Bed Granulation Amount: Medium (34-66%) Exposed Structure Granulation Quality: Red Fascia Exposed: No Necrotic Amount: Medium (34-66%) Fat Layer (Subcutaneous Tissue) Exposed: Yes Necrotic Quality: Adherent Slough Tendon Exposed: No Muscle Exposed: No Joint Exposed: No Bone Exposed: No Treatment Notes Wound #26 (Left, Distal Foot) 1. Cleanse With Wound Cleanser Soap and water 3. Primary Dressing Applied Other primary dressing (specifiy in notes) 4. Secondary Dressing Other secondary dressing (specify in notes) 6. Support Layer Applied Kerlix/Coban Notes bacitracin, lightly moistened saline gauze over, cotton instead of kerlix. netting Electronic Signature(s) Signed: 12/01/2019 5:26:04 PM By: Baruch Gouty RN, BSN Entered By: Baruch Gouty on 12/01/2019 10:17:47 -------------------------------------------------------------------------------- Vitals Details Patient Name: Date of Service: [name] [name] F. 12/01/2019 10:15 A M Medical Record Number: [id number] Patient Account Number:  [id number] Date of Birth/Sex: Treating RN: 08/08/1945 (74 y.o. [name] [name] Primary Care Breken Nazari: [name], [name] Other Clinician: Referring Bani Gianfrancesco: Treating Sascha Palma/Extender: Madduri, Murthy [name], [name] Weeks in Treatment: 189 Vital Signs Time Taken: 10:01 Temperature (F): 98 Height (in): 74 Pulse (bpm): 71 Source: Stated Respiratory Rate (breaths/min): 18 Weight (lbs): 150 Blood Pressure (mmHg): 124/73 Body Mass Index (BMI): 19.3 Capillary Blood Glucose (mg/dl): 101 Reference Range: 80 - 120 mg / dl Notes glucose per pt report Electronic Signature(s) Signed: 12/01/2019 5:26:04 PM By: Baruch Gouty RN, BSN Entered By: Baruch Gouty on 12/01/2019 10:02:03  13.548 Volume: (cm) 2.71 % Reduction in Area: -2363.3% % Reduction in Volume: -4827.3% Epithelialization: Small (1-33%) Tunneling: No Undermining: No Wound Description Classification: Grade 2 Wound Margin: Flat and Intact Exudate Amount: Medium Exudate Type: Serosanguineous Exudate Color: red, brown Foul Odor After Cleansing: No Slough/Fibrino Yes Wound Bed Granulation Amount: Medium (34-66%) Exposed Structure Granulation Quality: Red Fascia Exposed: No Necrotic Amount: Medium (34-66%) Fat Layer (Subcutaneous Tissue) Exposed: Yes Necrotic Quality: Adherent Slough Tendon Exposed: No Muscle Exposed: No Joint Exposed: No Bone Exposed: No Treatment Notes Wound #26 (Left, Distal Foot) 1. Cleanse With Wound Cleanser Soap and water 3. Primary Dressing Applied Other primary dressing (specifiy in notes) 4. Secondary Dressing Other secondary dressing (specify in notes) 6. Support Layer Applied Kerlix/Coban Notes bacitracin, lightly moistened saline gauze over, cotton instead of kerlix. netting Electronic Signature(s) Signed: 12/01/2019 5:26:04 PM By: Baruch Gouty RN, BSN Entered By: Baruch Gouty on 12/01/2019 10:16:50 -------------------------------------------------------------------------------- Wound Assessment Details Patient Name: Date of Service: [name] [name] F. 12/01/2019 10:15 A M Medical Record Number: [id number] Patient Account Number: [id number] Date of Birth/Sex: Treating RN: March 27, 1946 (74 y.o. [name] [name] Primary Care Latiesha Harada: [name], [name] Other Clinician: Referring Amareon Phung: Treating Chudney Scheffler/Extender: Madduri, Murthy [name], [name] Weeks in Treatment: 189 Wound  Status Wound Number: 26 Primary Diabetic Wound/Ulcer of the Lower Extremity Etiology: Wound Location: Left, Distal Foot Wound Open Wounding Event: Gradually Appeared Status: Date Acquired: 08/18/2019 Comorbid Anemia, Sleep Apnea, Arrhythmia, Congestive Heart Failure, Weeks Of Treatment: 0 History: Coronary Artery Disease, Hypertension, Myocardial Infarction, Clustered Wound: Yes Clustered Wound: Yes Type II Diabetes, Gout, Neuropathy Wound Measurements Length: (cm) 6.9 Width: (cm) 2.5 Depth: (cm) 0.2 Clustered Quantity: 3 Area: (cm) 13.548 Volume: (cm) 2.71 % Reduction in Area: % Reduction in Volume: Epithelialization: Small (1-33%) Tunneling: No Undermining: No Wound Description Classification: Grade 1 Wound Margin: Flat and Intact Exudate Amount: Medium Exudate Type: Serosanguineous Exudate Color: red, brown Foul Odor After Cleansing: No Slough/Fibrino Yes Wound Bed Granulation Amount: Medium (34-66%) Exposed Structure Granulation Quality: Red Fascia Exposed: No Necrotic Amount: Medium (34-66%) Fat Layer (Subcutaneous Tissue) Exposed: Yes Necrotic Quality: Adherent Slough Tendon Exposed: No Muscle Exposed: No Joint Exposed: No Bone Exposed: No Treatment Notes Wound #26 (Left, Distal Foot) 1. Cleanse With Wound Cleanser Soap and water 3. Primary Dressing Applied Other primary dressing (specifiy in notes) 4. Secondary Dressing Other secondary dressing (specify in notes) 6. Support Layer Applied Kerlix/Coban Notes bacitracin, lightly moistened saline gauze over, cotton instead of kerlix. netting Electronic Signature(s) Signed: 12/01/2019 5:26:04 PM By: Baruch Gouty RN, BSN Entered By: Baruch Gouty on 12/01/2019 10:17:47 -------------------------------------------------------------------------------- Vitals Details Patient Name: Date of Service: [name] [name] F. 12/01/2019 10:15 A M Medical Record Number: [id number] Patient Account Number:  [id number] Date of Birth/Sex: Treating RN: 08/08/1945 (74 y.o. [name] [name] Primary Care Breken Nazari: [name], [name] Other Clinician: Referring Bani Gianfrancesco: Treating Sascha Palma/Extender: Madduri, Murthy [name], [name] Weeks in Treatment: 189 Vital Signs Time Taken: 10:01 Temperature (F): 98 Height (in): 74 Pulse (bpm): 71 Source: Stated Respiratory Rate (breaths/min): 18 Weight (lbs): 150 Blood Pressure (mmHg): 124/73 Body Mass Index (BMI): 19.3 Capillary Blood Glucose (mg/dl): 101 Reference Range: 80 - 120 mg / dl Notes glucose per pt report Electronic Signature(s) Signed: 12/01/2019 5:26:04 PM By: Baruch Gouty RN, BSN Entered By: Baruch Gouty on 12/01/2019 10:02:03  [name] [name], [name] [name] ([id number]) Visit Report for 12/01/2019 Arrival Information Details Patient Name: Date of Service: [name] [name] [facility] F. 12/01/2019 10:15 A M Medical Record Number: [id number] Patient Account Number: [id number] Date of Birth/Sex: Treating RN: 1945/10/06 (74 y.o. [name] [name] Primary Care Eustacia Urbanek: [name], [name] Other Clinician: Referring Lonzie Simmer: Treating Nate Common/Extender: Madduri, Murthy [name], [name] Weeks in Treatment: 189 Visit Information History Since Last Visit Added or deleted any medications: No Patient Arrived: Wheel Chair Any new allergies or adverse reactions: No Arrival Time: 09:54 Had a fall or experienced change in No Accompanied By: dgt activities of daily living that may affect Transfer Assistance: Transfer Board risk of falls: Patient Requires Transmission-Based Precautions: No Signs or symptoms of abuse/neglect since last visito No Patient Has Alerts: Yes Hospitalized since last visit: No Patient Alerts: Patient on Blood Thinner Implantable device outside of the clinic excluding No left ABI 1.0; rt ABI 1.09 cellular tissue based products placed in the center since last visit: Has Dressing in Place as Prescribed: Yes Has Compression in Place as Prescribed: Yes Pain Present Now: No Electronic Signature(s) Signed: 12/01/2019 5:26:04 PM By: Baruch Gouty RN, BSN Entered By: Baruch Gouty on 12/01/2019 10:00:58 -------------------------------------------------------------------------------- Clinic Level of Care Assessment Details Patient Name: Date of Service: [name] [name] F. 12/01/2019 10:15 A M Medical Record Number: [id number] Patient Account Number: [id number] Date of Birth/Sex: Treating RN: [birth year] (74 y.o. [name] [name]) [name] [name] Primary Care Rudell Ortman: [name], [name] Other Clinician: Referring Odelle Kosier: Treating Chaden Doom/Extender: Madduri, Murthy [name], [name] Weeks in Treatment: 189 Clinic Level of Care Assessment Items TOOL  4 Quantity Score []  - 0 Use when only an EandM is performed on FOLLOW-UP visit ASSESSMENTS - Nursing Assessment / Reassessment []  - 0 Reassessment of Co-morbidities (includes updates in patient status) []  - 0 Reassessment of Adherence to Treatment Plan ASSESSMENTS - Wound and Skin A ssessment / Reassessment []  - 0 Simple Wound Assessment / Reassessment - one wound X- 5 5 Complex Wound Assessment / Reassessment - multiple wounds []  - 0 Dermatologic / Skin Assessment (not related to wound area) ASSESSMENTS - Focused Assessment []  - 0 Circumferential Edema Measurements - multi extremities []  - 0 Nutritional Assessment / Counseling / Intervention []  - 0 Lower Extremity Assessment (monofilament, tuning fork, pulses) []  - 0 Peripheral Arterial Disease Assessment (using hand held doppler) ASSESSMENTS - Ostomy and/or Continence Assessment and Care []  - 0 Incontinence Assessment and Management []  - 0 Ostomy Care Assessment and Management (repouching, etc.) PROCESS - Coordination of Care X - Simple Patient / Family Education for ongoing care 1 15 []  - 0 Complex (extensive) Patient / Family Education for ongoing care X- 1 10 Staff obtains Programmer, systems, Records, T Results / Process Orders est []  - 0 Staff telephones HHA, Nursing Homes / Clarify orders / etc []  - 0 Routine Transfer to another Facility (non-emergent condition) []  - 0 Routine Hospital Admission (non-emergent condition) []  - 0 New Admissions / Biomedical engineer / Ordering NPWT Apligraf, etc. , []  - 0 Emergency Hospital Admission (emergent condition) X- 1 10 Simple Discharge Coordination []  - 0 Complex (extensive) Discharge Coordination PROCESS - Special Needs []  - 0 Pediatric / Minor Patient Management []  - 0 Isolation Patient Management []  - 0 Hearing / Language / Visual special needs []  - 0 Assessment of Community assistance (transportation, D/C planning, etc.) []  - 0 Additional assistance / Altered  mentation []  - 0 Support Surface(s) Assessment (bed, cushion, seat, etc.) INTERVENTIONS - Wound Cleansing / Measurement []  - 0 Simple

## 2019-12-08 ENCOUNTER — Encounter (HOSPITAL_BASED_OUTPATIENT_CLINIC_OR_DEPARTMENT_OTHER): Payer: Medicare HMO | Admitting: Internal Medicine

## 2019-12-14 ENCOUNTER — Other Ambulatory Visit: Payer: Self-pay | Admitting: Family Medicine

## 2019-12-15 ENCOUNTER — Encounter (HOSPITAL_BASED_OUTPATIENT_CLINIC_OR_DEPARTMENT_OTHER): Payer: Medicare HMO | Admitting: Internal Medicine

## 2019-12-22 ENCOUNTER — Encounter (HOSPITAL_BASED_OUTPATIENT_CLINIC_OR_DEPARTMENT_OTHER): Payer: Medicare HMO | Admitting: Internal Medicine

## 2019-12-22 DIAGNOSIS — L97221 Non-pressure chronic ulcer of left calf limited to breakdown of skin: Secondary | ICD-10-CM | POA: Diagnosis not present

## 2019-12-22 DIAGNOSIS — B9562 Methicillin resistant Staphylococcus aureus infection as the cause of diseases classified elsewhere: Secondary | ICD-10-CM | POA: Diagnosis not present

## 2019-12-22 DIAGNOSIS — Z1611 Resistance to penicillins: Secondary | ICD-10-CM | POA: Diagnosis not present

## 2019-12-22 DIAGNOSIS — L89613 Pressure ulcer of right heel, stage 3: Secondary | ICD-10-CM | POA: Diagnosis not present

## 2019-12-22 DIAGNOSIS — Z1629 Resistance to other single specified antibiotic: Secondary | ICD-10-CM | POA: Diagnosis not present

## 2019-12-22 DIAGNOSIS — L97522 Non-pressure chronic ulcer of other part of left foot with fat layer exposed: Secondary | ICD-10-CM | POA: Diagnosis not present

## 2019-12-22 DIAGNOSIS — L97321 Non-pressure chronic ulcer of left ankle limited to breakdown of skin: Secondary | ICD-10-CM | POA: Diagnosis not present

## 2019-12-22 DIAGNOSIS — L97521 Non-pressure chronic ulcer of other part of left foot limited to breakdown of skin: Secondary | ICD-10-CM | POA: Diagnosis not present

## 2019-12-22 DIAGNOSIS — E11622 Type 2 diabetes mellitus with other skin ulcer: Secondary | ICD-10-CM | POA: Diagnosis not present

## 2019-12-22 DIAGNOSIS — B951 Streptococcus, group B, as the cause of diseases classified elsewhere: Secondary | ICD-10-CM | POA: Diagnosis not present

## 2019-12-22 DIAGNOSIS — L97211 Non-pressure chronic ulcer of right calf limited to breakdown of skin: Secondary | ICD-10-CM | POA: Diagnosis not present

## 2019-12-23 NOTE — Progress Notes (Signed)
wound with soap and water. Primary Wound Dressing: Wound #14 Left,Lateral Lower Leg: Calcium Alginate with Silver Wound #23 Right,Lateral Calcaneus: Calcium Alginate with Silver Wound #25 Right,Lateral Lower Leg: Calcium Alginate with Silver Wound #26 Left,Dorsal Foot: Calcium Alginate with Silver Secondary Dressing: Wound #14 Left,Lateral Lower Leg: Dry Gauze Wound #23 Right,Lateral Calcaneus: Dry Gauze Wound #25 Right,Lateral Lower Leg: Dry Gauze Wound #26 Left,Dorsal Foot: Dry Gauze Edema Control: Kerlix and Coban - Bilateral - use cotton roll not kerlix LIGHTLY Avoid standing for long periods of time Elevate legs to the level of the heart or above for 30 minutes daily and/or when sitting, a frequency of: - throughout the day Off-Loading: Multipodus Splint to: - patient to wear bunny boots to both feet while resting in chair and bed. Laboratory ordered were: Areobic culture-specimen not specified, LEFT LOWER LEG - PCR CULTURE 1. I think this man is relatively immobile he has pressure components as well as chronic venous insufficiency. I wonder how well his daughter puts on compression which is the other issue here. 2. I have done a deep tissue culture of the left lateral leg because her intake nurses reported purulent drainage and in the 6 weeks since I have seen this is a lot worse. No empiric antibiotics, the culture results should be back by the end of the week or  earlier 3. Silver alginate on all wound areas Electronic Signature(s) Signed: 12/22/2019 5:08:08 PM By: [name] Ham [name] Entered By: [name] [name] on 12/22/2019 13:02:46 -------------------------------------------------------------------------------- SuperBill Details Patient Name: Date of Service: [name] [name] F. 12/22/2019 Medical Record Number: [id number] Patient Account Number: [id number] Date of Birth/Sex: Treating RN: 1945-06-19 (74 y.o. [name] [name] Primary Care Provider: [name], [name] Other Clinician: Referring Provider: Treating Provider/Extender: [name] [name] Diagnosis Coding ICD-10 Codes Code Description [phone] Pressure ulcer of right heel, stage 3 L97.221 Non-pressure chronic ulcer of left calf limited to breakdown of skin L97.321 Non-pressure chronic ulcer of left ankle limited to breakdown of skin L97.521 Non-pressure chronic ulcer of other part of left foot limited to breakdown of skin L97.211 Non-pressure chronic ulcer of right calf limited to breakdown of skin Facility Procedures CPT4 Code: 86381771 Description: 16579 - DEB SUBQ TISSUE 20 SQ CM/< ICD-10 Diagnosis Description L97.221 Non-pressure chronic ulcer of left calf limited to breakdown of skin Modifier: Quantity: 1 Physician Procedures : CPT4 Code Description Modifier [id number] 32919 - WC PHYS SUBQ TISS 20 SQ CM ICD-10 Diagnosis Description L97.221 Non-pressure chronic ulcer of left calf limited to breakdown of skin Quantity: 1 Electronic Signature(s) Signed: 12/22/2019 5:08:08 PM By: [name] Ham [name] Entered By: [name] [name] on 12/22/2019 13:03:06  [name] [name], [name] [name] ([id number]) Visit Report for 12/22/2019 Debridement Details Patient Name: Date of Service: [name] Grapes [facility] F. 12/22/2019 10:15 A M Medical Record Number: [id number] Patient Account Number: [id number] Date of Birth/Sex: Treating RN: [birth year] (74 y.o. [name] [name]) [name] [name] Primary Care Provider: [name], [name] Other Clinician: Referring Provider: Treating Provider/Extender: [name] [name] [name] [name] Weeks in Treatment: [name] Debridement Performed for Assessment: Wound #14 Left,Lateral Lower Leg Performed By: Physician [name] [name] Debridement Type: Debridement Severity of Tissue Pre Debridement: Fat layer exposed Level of Consciousness (Pre-procedure): Awake and Alert Pre-procedure Verification/Time Out Yes - 11:02 Taken: Start Time: 11:02 Pain Control: Lidocaine 5% topical ointment T Area Debrided (L x W): otal 5 (cm) x 2 (cm) = 10 (cm) Tissue and other material debrided: Viable, Non-Viable, Slough, Subcutaneous, Skin: Dermis , Skin: Epidermis, Slough Level: Skin/Subcutaneous Tissue Debridement Description: Excisional Instrument: Curette Specimen: Tissue Culture Number of Specimens T aken: 1 Bleeding: Moderate Hemostasis Achieved: Pressure End Time: 11:06 Procedural Pain: 0 Post Procedural Pain: 0 Response to Treatment: Procedure was tolerated well Level of Consciousness (Post- Awake and Alert procedure): Post Debridement Measurements of Total Wound Length: (cm) 11 Width: (cm) 2 Depth: (cm) 0.2 Volume: (cm) 3.456 Character of Wound/Ulcer Post Debridement: Improved Severity of Tissue Post Debridement: Fat layer exposed Post Procedure Diagnosis Same as Pre-procedure Electronic Signature(s) Signed: 12/22/2019 5:08:08 PM By: [name] Ham [name] Signed: 12/23/2019 10:37:46 AM By: [name] Coria RN Entered By: [name] [name] on 12/22/2019 12:56:09 -------------------------------------------------------------------------------- HPI Details Patient  Name: Date of Service: [name] [name] F. 12/22/2019 10:15 A M Medical Record Number: [id number] Patient Account Number: [id number] Date of Birth/Sex: Treating RN: [birth year] (74 y.o. [name] [name] Primary Care Provider: [name], [name] Other Clinician: Referring Provider: Treating Provider/Extender: [name] [name] [name] [name] Weeks in Treatment: [name] History of Present Illness Location: On the left and right lateral forefoot which has been there for about 6 months Quality: Patient reports No Pain. Severity: Patient states wound(s) are getting worse. Duration: Patient has had the wound for > 6 months prior to seeking treatment at the wound center Context: The wound would happen gradually Modifying Factors: Patient wound(s)/ulcer(s) are worsening due to :continual drainage from the wound ssociated Signs and Symptoms: Patient reports having increase discharge. A HPI Description: This patient returns after being seen here till the end of August and he was lost to follow-up. he has been quite debilitated laying in bed most of the time and his condition has deteriorated significantly. He has multiple ulcerations on the heel lateral forefoot and some of his toes. ======== Old notes: 74 year old male known to our practice when he was seen here in February and March and was lost to follow-up when he was admitted to hospital with various medical problems including coronary artery disease and a stroke. Now returns with the problem on the left forefoot where he has an ulceration and this has been there for about 6 months. most recently he was in hospital between July 6 and July 16, when he was admitted and treated for acute respiratory failure is secondary to aspiration pneumonia, large non-STEMI, ischemic cardiomyopathy with systolic and diastolic congestive heart failure with ejection fraction about 15-20%, ventricular tachycardia and has been treated with amiodarone, acute on careful up at the  common new acute CVA, acute chronic kidney disease stage III, anemia, uncontrolled diabetes mellitus with last hemoglobin A1c being 12%. He has had persistent hyperglycemia given recently. Patient has a past medical history of diabetes mellitus, hypertension, combined systolic and  [name] [name], [name] [name] ([id number]) Visit Report for 12/22/2019 Debridement Details Patient Name: Date of Service: [name] Grapes [facility] F. 12/22/2019 10:15 A M Medical Record Number: [id number] Patient Account Number: [id number] Date of Birth/Sex: Treating RN: [birth year] (74 y.o. [name] [name]) [name] [name] Primary Care Provider: [name], [name] Other Clinician: Referring Provider: Treating Provider/Extender: [name] [name] [name] [name] Weeks in Treatment: [name] Debridement Performed for Assessment: Wound #14 Left,Lateral Lower Leg Performed By: Physician [name] [name] Debridement Type: Debridement Severity of Tissue Pre Debridement: Fat layer exposed Level of Consciousness (Pre-procedure): Awake and Alert Pre-procedure Verification/Time Out Yes - 11:02 Taken: Start Time: 11:02 Pain Control: Lidocaine 5% topical ointment T Area Debrided (L x W): otal 5 (cm) x 2 (cm) = 10 (cm) Tissue and other material debrided: Viable, Non-Viable, Slough, Subcutaneous, Skin: Dermis , Skin: Epidermis, Slough Level: Skin/Subcutaneous Tissue Debridement Description: Excisional Instrument: Curette Specimen: Tissue Culture Number of Specimens T aken: 1 Bleeding: Moderate Hemostasis Achieved: Pressure End Time: 11:06 Procedural Pain: 0 Post Procedural Pain: 0 Response to Treatment: Procedure was tolerated well Level of Consciousness (Post- Awake and Alert procedure): Post Debridement Measurements of Total Wound Length: (cm) 11 Width: (cm) 2 Depth: (cm) 0.2 Volume: (cm) 3.456 Character of Wound/Ulcer Post Debridement: Improved Severity of Tissue Post Debridement: Fat layer exposed Post Procedure Diagnosis Same as Pre-procedure Electronic Signature(s) Signed: 12/22/2019 5:08:08 PM By: [name] Ham [name] Signed: 12/23/2019 10:37:46 AM By: [name] Coria RN Entered By: [name] [name] on 12/22/2019 12:56:09 -------------------------------------------------------------------------------- HPI Details Patient  Name: Date of Service: [name] [name] F. 12/22/2019 10:15 A M Medical Record Number: [id number] Patient Account Number: [id number] Date of Birth/Sex: Treating RN: [birth year] (74 y.o. [name] [name] Primary Care Provider: [name], [name] Other Clinician: Referring Provider: Treating Provider/Extender: [name] [name] [name] [name] Weeks in Treatment: [name] History of Present Illness Location: On the left and right lateral forefoot which has been there for about 6 months Quality: Patient reports No Pain. Severity: Patient states wound(s) are getting worse. Duration: Patient has had the wound for > 6 months prior to seeking treatment at the wound center Context: The wound would happen gradually Modifying Factors: Patient wound(s)/ulcer(s) are worsening due to :continual drainage from the wound ssociated Signs and Symptoms: Patient reports having increase discharge. A HPI Description: This patient returns after being seen here till the end of August and he was lost to follow-up. he has been quite debilitated laying in bed most of the time and his condition has deteriorated significantly. He has multiple ulcerations on the heel lateral forefoot and some of his toes. ======== Old notes: 74 year old male known to our practice when he was seen here in February and March and was lost to follow-up when he was admitted to hospital with various medical problems including coronary artery disease and a stroke. Now returns with the problem on the left forefoot where he has an ulceration and this has been there for about 6 months. most recently he was in hospital between July 6 and July 16, when he was admitted and treated for acute respiratory failure is secondary to aspiration pneumonia, large non-STEMI, ischemic cardiomyopathy with systolic and diastolic congestive heart failure with ejection fraction about 15-20%, ventricular tachycardia and has been treated with amiodarone, acute on careful up at the  common new acute CVA, acute chronic kidney disease stage III, anemia, uncontrolled diabetes mellitus with last hemoglobin A1c being 12%. He has had persistent hyperglycemia given recently. Patient has a past medical history of diabetes mellitus, hypertension, combined systolic and  wound with soap and water. Primary Wound Dressing: Wound #14 Left,Lateral Lower Leg: Calcium Alginate with Silver Wound #23 Right,Lateral Calcaneus: Calcium Alginate with Silver Wound #25 Right,Lateral Lower Leg: Calcium Alginate with Silver Wound #26 Left,Dorsal Foot: Calcium Alginate with Silver Secondary Dressing: Wound #14 Left,Lateral Lower Leg: Dry Gauze Wound #23 Right,Lateral Calcaneus: Dry Gauze Wound #25 Right,Lateral Lower Leg: Dry Gauze Wound #26 Left,Dorsal Foot: Dry Gauze Edema Control: Kerlix and Coban - Bilateral - use cotton roll not kerlix LIGHTLY Avoid standing for long periods of time Elevate legs to the level of the heart or above for 30 minutes daily and/or when sitting, a frequency of: - throughout the day Off-Loading: Multipodus Splint to: - patient to wear bunny boots to both feet while resting in chair and bed. Laboratory ordered were: Areobic culture-specimen not specified, LEFT LOWER LEG - PCR CULTURE 1. I think this man is relatively immobile he has pressure components as well as chronic venous insufficiency. I wonder how well his daughter puts on compression which is the other issue here. 2. I have done a deep tissue culture of the left lateral leg because her intake nurses reported purulent drainage and in the 6 weeks since I have seen this is a lot worse. No empiric antibiotics, the culture results should be back by the end of the week or  earlier 3. Silver alginate on all wound areas Electronic Signature(s) Signed: 12/22/2019 5:08:08 PM By: [name] Ham [name] Entered By: [name] [name] on 12/22/2019 13:02:46 -------------------------------------------------------------------------------- SuperBill Details Patient Name: Date of Service: [name] [name] F. 12/22/2019 Medical Record Number: [id number] Patient Account Number: [id number] Date of Birth/Sex: Treating RN: 1945-06-19 (74 y.o. [name] [name] Primary Care Provider: [name], [name] Other Clinician: Referring Provider: Treating Provider/Extender: [name] [name] Diagnosis Coding ICD-10 Codes Code Description [phone] Pressure ulcer of right heel, stage 3 L97.221 Non-pressure chronic ulcer of left calf limited to breakdown of skin L97.321 Non-pressure chronic ulcer of left ankle limited to breakdown of skin L97.521 Non-pressure chronic ulcer of other part of left foot limited to breakdown of skin L97.211 Non-pressure chronic ulcer of right calf limited to breakdown of skin Facility Procedures CPT4 Code: 86381771 Description: 16579 - DEB SUBQ TISSUE 20 SQ CM/< ICD-10 Diagnosis Description L97.221 Non-pressure chronic ulcer of left calf limited to breakdown of skin Modifier: Quantity: 1 Physician Procedures : CPT4 Code Description Modifier [id number] 32919 - WC PHYS SUBQ TISS 20 SQ CM ICD-10 Diagnosis Description L97.221 Non-pressure chronic ulcer of left calf limited to breakdown of skin Quantity: 1 Electronic Signature(s) Signed: 12/22/2019 5:08:08 PM By: [name] Ham [name] Entered By: [name] [name] on 12/22/2019 13:03:06  wound with soap and water. Primary Wound Dressing: Wound #14 Left,Lateral Lower Leg: Calcium Alginate with Silver Wound #23 Right,Lateral Calcaneus: Calcium Alginate with Silver Wound #25 Right,Lateral Lower Leg: Calcium Alginate with Silver Wound #26 Left,Dorsal Foot: Calcium Alginate with Silver Secondary Dressing: Wound #14 Left,Lateral Lower Leg: Dry Gauze Wound #23 Right,Lateral Calcaneus: Dry Gauze Wound #25 Right,Lateral Lower Leg: Dry Gauze Wound #26 Left,Dorsal Foot: Dry Gauze Edema Control: Kerlix and Coban - Bilateral - use cotton roll not kerlix LIGHTLY Avoid standing for long periods of time Elevate legs to the level of the heart or above for 30 minutes daily and/or when sitting, a frequency of: - throughout the day Off-Loading: Multipodus Splint to: - patient to wear bunny boots to both feet while resting in chair and bed. Laboratory ordered were: Areobic culture-specimen not specified, LEFT LOWER LEG - PCR CULTURE 1. I think this man is relatively immobile he has pressure components as well as chronic venous insufficiency. I wonder how well his daughter puts on compression which is the other issue here. 2. I have done a deep tissue culture of the left lateral leg because her intake nurses reported purulent drainage and in the 6 weeks since I have seen this is a lot worse. No empiric antibiotics, the culture results should be back by the end of the week or  earlier 3. Silver alginate on all wound areas Electronic Signature(s) Signed: 12/22/2019 5:08:08 PM By: [name] Ham [name] Entered By: [name] [name] on 12/22/2019 13:02:46 -------------------------------------------------------------------------------- SuperBill Details Patient Name: Date of Service: [name] [name] F. 12/22/2019 Medical Record Number: [id number] Patient Account Number: [id number] Date of Birth/Sex: Treating RN: 1945-06-19 (74 y.o. [name] [name] Primary Care Provider: [name], [name] Other Clinician: Referring Provider: Treating Provider/Extender: [name] [name] Diagnosis Coding ICD-10 Codes Code Description [phone] Pressure ulcer of right heel, stage 3 L97.221 Non-pressure chronic ulcer of left calf limited to breakdown of skin L97.321 Non-pressure chronic ulcer of left ankle limited to breakdown of skin L97.521 Non-pressure chronic ulcer of other part of left foot limited to breakdown of skin L97.211 Non-pressure chronic ulcer of right calf limited to breakdown of skin Facility Procedures CPT4 Code: 86381771 Description: 16579 - DEB SUBQ TISSUE 20 SQ CM/< ICD-10 Diagnosis Description L97.221 Non-pressure chronic ulcer of left calf limited to breakdown of skin Modifier: Quantity: 1 Physician Procedures : CPT4 Code Description Modifier [id number] 32919 - WC PHYS SUBQ TISS 20 SQ CM ICD-10 Diagnosis Description L97.221 Non-pressure chronic ulcer of left calf limited to breakdown of skin Quantity: 1 Electronic Signature(s) Signed: 12/22/2019 5:08:08 PM By: [name] Ham [name] Entered By: [name] [name] on 12/22/2019 13:03:06  wound with soap and water. Primary Wound Dressing: Wound #14 Left,Lateral Lower Leg: Calcium Alginate with Silver Wound #23 Right,Lateral Calcaneus: Calcium Alginate with Silver Wound #25 Right,Lateral Lower Leg: Calcium Alginate with Silver Wound #26 Left,Dorsal Foot: Calcium Alginate with Silver Secondary Dressing: Wound #14 Left,Lateral Lower Leg: Dry Gauze Wound #23 Right,Lateral Calcaneus: Dry Gauze Wound #25 Right,Lateral Lower Leg: Dry Gauze Wound #26 Left,Dorsal Foot: Dry Gauze Edema Control: Kerlix and Coban - Bilateral - use cotton roll not kerlix LIGHTLY Avoid standing for long periods of time Elevate legs to the level of the heart or above for 30 minutes daily and/or when sitting, a frequency of: - throughout the day Off-Loading: Multipodus Splint to: - patient to wear bunny boots to both feet while resting in chair and bed. Laboratory ordered were: Areobic culture-specimen not specified, LEFT LOWER LEG - PCR CULTURE 1. I think this man is relatively immobile he has pressure components as well as chronic venous insufficiency. I wonder how well his daughter puts on compression which is the other issue here. 2. I have done a deep tissue culture of the left lateral leg because her intake nurses reported purulent drainage and in the 6 weeks since I have seen this is a lot worse. No empiric antibiotics, the culture results should be back by the end of the week or  earlier 3. Silver alginate on all wound areas Electronic Signature(s) Signed: 12/22/2019 5:08:08 PM By: [name] Ham [name] Entered By: [name] [name] on 12/22/2019 13:02:46 -------------------------------------------------------------------------------- SuperBill Details Patient Name: Date of Service: [name] [name] F. 12/22/2019 Medical Record Number: [id number] Patient Account Number: [id number] Date of Birth/Sex: Treating RN: 1945-06-19 (74 y.o. [name] [name] Primary Care Provider: [name], [name] Other Clinician: Referring Provider: Treating Provider/Extender: [name] [name] Diagnosis Coding ICD-10 Codes Code Description [phone] Pressure ulcer of right heel, stage 3 L97.221 Non-pressure chronic ulcer of left calf limited to breakdown of skin L97.321 Non-pressure chronic ulcer of left ankle limited to breakdown of skin L97.521 Non-pressure chronic ulcer of other part of left foot limited to breakdown of skin L97.211 Non-pressure chronic ulcer of right calf limited to breakdown of skin Facility Procedures CPT4 Code: 86381771 Description: 16579 - DEB SUBQ TISSUE 20 SQ CM/< ICD-10 Diagnosis Description L97.221 Non-pressure chronic ulcer of left calf limited to breakdown of skin Modifier: Quantity: 1 Physician Procedures : CPT4 Code Description Modifier [id number] 32919 - WC PHYS SUBQ TISS 20 SQ CM ICD-10 Diagnosis Description L97.221 Non-pressure chronic ulcer of left calf limited to breakdown of skin Quantity: 1 Electronic Signature(s) Signed: 12/22/2019 5:08:08 PM By: [name] Ham [name] Entered By: [name] [name] on 12/22/2019 13:03:06  wound with soap and water. Primary Wound Dressing: Wound #14 Left,Lateral Lower Leg: Calcium Alginate with Silver Wound #23 Right,Lateral Calcaneus: Calcium Alginate with Silver Wound #25 Right,Lateral Lower Leg: Calcium Alginate with Silver Wound #26 Left,Dorsal Foot: Calcium Alginate with Silver Secondary Dressing: Wound #14 Left,Lateral Lower Leg: Dry Gauze Wound #23 Right,Lateral Calcaneus: Dry Gauze Wound #25 Right,Lateral Lower Leg: Dry Gauze Wound #26 Left,Dorsal Foot: Dry Gauze Edema Control: Kerlix and Coban - Bilateral - use cotton roll not kerlix LIGHTLY Avoid standing for long periods of time Elevate legs to the level of the heart or above for 30 minutes daily and/or when sitting, a frequency of: - throughout the day Off-Loading: Multipodus Splint to: - patient to wear bunny boots to both feet while resting in chair and bed. Laboratory ordered were: Areobic culture-specimen not specified, LEFT LOWER LEG - PCR CULTURE 1. I think this man is relatively immobile he has pressure components as well as chronic venous insufficiency. I wonder how well his daughter puts on compression which is the other issue here. 2. I have done a deep tissue culture of the left lateral leg because her intake nurses reported purulent drainage and in the 6 weeks since I have seen this is a lot worse. No empiric antibiotics, the culture results should be back by the end of the week or  earlier 3. Silver alginate on all wound areas Electronic Signature(s) Signed: 12/22/2019 5:08:08 PM By: [name] Ham [name] Entered By: [name] [name] on 12/22/2019 13:02:46 -------------------------------------------------------------------------------- SuperBill Details Patient Name: Date of Service: [name] [name] F. 12/22/2019 Medical Record Number: [id number] Patient Account Number: [id number] Date of Birth/Sex: Treating RN: 1945-06-19 (74 y.o. [name] [name] Primary Care Provider: [name], [name] Other Clinician: Referring Provider: Treating Provider/Extender: [name] [name] Diagnosis Coding ICD-10 Codes Code Description [phone] Pressure ulcer of right heel, stage 3 L97.221 Non-pressure chronic ulcer of left calf limited to breakdown of skin L97.321 Non-pressure chronic ulcer of left ankle limited to breakdown of skin L97.521 Non-pressure chronic ulcer of other part of left foot limited to breakdown of skin L97.211 Non-pressure chronic ulcer of right calf limited to breakdown of skin Facility Procedures CPT4 Code: 86381771 Description: 16579 - DEB SUBQ TISSUE 20 SQ CM/< ICD-10 Diagnosis Description L97.221 Non-pressure chronic ulcer of left calf limited to breakdown of skin Modifier: Quantity: 1 Physician Procedures : CPT4 Code Description Modifier [id number] 32919 - WC PHYS SUBQ TISS 20 SQ CM ICD-10 Diagnosis Description L97.221 Non-pressure chronic ulcer of left calf limited to breakdown of skin Quantity: 1 Electronic Signature(s) Signed: 12/22/2019 5:08:08 PM By: [name] Ham [name] Entered By: [name] [name] on 12/22/2019 13:03:06  Left,Lateral Lower Leg Dry Gauze Wound #23 Right,Lateral Calcaneus Dry Gauze Wound #25 Right,Lateral Lower Leg Dry Gauze Wound #26 Left,Dorsal Foot Dry Gauze Edema Control Kerlix and Coban - Bilateral - use cotton roll not kerlix LIGHTLY Avoid standing for long periods of time Elevate legs to the level of the heart or above for 30 minutes daily and/or when sitting, a frequency of: - throughout the day Off-Loading Multipodus Splint to: - patient to wear bunny boots to both feet while resting in chair and bed. Laboratory erobe culture (MICRO) - PCR CULTURE - (ICD10 L97.221 - Non-pressure chronic  ulcer of left Bacteria identified in Unspecified specimen by A calf limited to breakdown of skin) LOINC Code: 409-8 Convenience Name: Areobic culture-specimen not specified Electronic Signature(s) Signed: 12/22/2019 5:08:08 PM By: [name] Ham [name] Signed: 12/23/2019 10:37:46 AM By: [name] Coria RN Entered By: [name] [name] on 12/22/2019 11:10:17 -------------------------------------------------------------------------------- Problem List Details Patient Name: Date of Service: [name] [name] F. 12/22/2019 10:15 A M Medical Record Number: [id number] Patient Account Number: [id number] Date of Birth/Sex: Treating RN: 03/27/1946 (74 y.o. [name] [name] Primary Care Provider: [name], [name] Other Clinician: Referring Provider: Treating Provider/Extender: Roshon Duell [name] [name] Weeks in Treatment: [name] Active Problems ICD-10 Encounter Code Description Active Date MDM Diagnosis L89.613 Pressure ulcer of right heel, stage 3 04/17/2016 No Yes L97.221 Non-pressure chronic ulcer of left calf limited to breakdown of skin 08/15/2017 No Yes L97.321 Non-pressure chronic ulcer of left ankle limited to breakdown of skin 07/01/2018 No Yes L97.521 Non-pressure chronic ulcer of other part of left foot limited to breakdown of 02/17/2019 No Yes skin L97.211 Non-pressure chronic ulcer of right calf limited to breakdown of skin 07/21/2019 No Yes Inactive Problems ICD-10 Code Description Active Date Inactive Date E11.621 Type 2 diabetes mellitus with foot ulcer 04/17/2016 04/17/2016 L03.032 Cellulitis of left toe 01/06/2019 01/06/2019 F62.130 Pressure ulcer of left heel, stage 3 12/04/2016 12/04/2016 I25.119 Atherosclerotic heart disease of native coronary artery with unspecified angina 04/17/2016 04/17/2016 pectoris L97.821 Non-pressure chronic ulcer of other part of left lower leg limited to breakdown of skin 01/06/2019 01/06/2019 S51.811D Laceration without foreign body of right forearm, subsequent  encounter 10/22/2017 10/22/2017 Q65.78 Acute diastolic (congestive) heart failure 04/17/2016 04/17/2016 L03.116 Cellulitis of left lower limb 12/24/2017 12/24/2017 L89.620 Pressure ulcer of left heel, unstageable 07/21/2019 07/21/2019 S80.211D Abrasion, right knee, subsequent encounter 08/18/2019 08/18/2019 Resolved Problems ICD-10 Code Description Active Date Resolved Date L89.512 Pressure ulcer of right ankle, stage 2 04/17/2016 04/17/2016 L89.522 Pressure ulcer of left ankle, stage 2 04/17/2016 04/17/2016 Electronic Signature(s) Signed: 12/22/2019 5:08:08 PM By: [name] Ham [name] Entered By: [name] [name] on 12/22/2019 12:54:47 -------------------------------------------------------------------------------- Progress Note Details Patient Name: Date of Service: [name] [name] F. 12/22/2019 10:15 A M Medical Record Number: [id number] Patient Account Number: [id number] Date of Birth/Sex: Treating RN: [birth year] (74 y.o. [name] [name] Primary Care Provider: [name], [name] Other Clinician: Referring Provider: Treating Provider/Extender: Eleora Sutherland [name] [name] Weeks in Treatment: [name] Subjective History of Present Illness (HPI) The following HPI elements were documented for the patient's wound: Location: On the left and right lateral forefoot which has been there for about 6 months Quality: Patient reports No Pain. Severity: Patient states wound(s) are getting worse. Duration: Patient has had the wound for > 6 months prior to seeking treatment at the wound center Context: The wound would happen gradually Modifying Factors: Patient wound(s)/ulcer(s) are worsening due to :continual drainage from the wound Associated Signs and Symptoms: Patient reports having increase discharge. This patient  [name] [name], [name] [name] ([id number]) Visit Report for 12/22/2019 Debridement Details Patient Name: Date of Service: [name] Grapes [facility] F. 12/22/2019 10:15 A M Medical Record Number: [id number] Patient Account Number: [id number] Date of Birth/Sex: Treating RN: [birth year] (74 y.o. [name] [name]) [name] [name] Primary Care Provider: [name], [name] Other Clinician: Referring Provider: Treating Provider/Extender: [name] [name] [name] [name] Weeks in Treatment: [name] Debridement Performed for Assessment: Wound #14 Left,Lateral Lower Leg Performed By: Physician [name] [name] Debridement Type: Debridement Severity of Tissue Pre Debridement: Fat layer exposed Level of Consciousness (Pre-procedure): Awake and Alert Pre-procedure Verification/Time Out Yes - 11:02 Taken: Start Time: 11:02 Pain Control: Lidocaine 5% topical ointment T Area Debrided (L x W): otal 5 (cm) x 2 (cm) = 10 (cm) Tissue and other material debrided: Viable, Non-Viable, Slough, Subcutaneous, Skin: Dermis , Skin: Epidermis, Slough Level: Skin/Subcutaneous Tissue Debridement Description: Excisional Instrument: Curette Specimen: Tissue Culture Number of Specimens T aken: 1 Bleeding: Moderate Hemostasis Achieved: Pressure End Time: 11:06 Procedural Pain: 0 Post Procedural Pain: 0 Response to Treatment: Procedure was tolerated well Level of Consciousness (Post- Awake and Alert procedure): Post Debridement Measurements of Total Wound Length: (cm) 11 Width: (cm) 2 Depth: (cm) 0.2 Volume: (cm) 3.456 Character of Wound/Ulcer Post Debridement: Improved Severity of Tissue Post Debridement: Fat layer exposed Post Procedure Diagnosis Same as Pre-procedure Electronic Signature(s) Signed: 12/22/2019 5:08:08 PM By: [name] Ham [name] Signed: 12/23/2019 10:37:46 AM By: [name] Coria RN Entered By: [name] [name] on 12/22/2019 12:56:09 -------------------------------------------------------------------------------- HPI Details Patient  Name: Date of Service: [name] [name] F. 12/22/2019 10:15 A M Medical Record Number: [id number] Patient Account Number: [id number] Date of Birth/Sex: Treating RN: [birth year] (74 y.o. [name] [name] Primary Care Provider: [name], [name] Other Clinician: Referring Provider: Treating Provider/Extender: [name] [name] [name] [name] Weeks in Treatment: [name] History of Present Illness Location: On the left and right lateral forefoot which has been there for about 6 months Quality: Patient reports No Pain. Severity: Patient states wound(s) are getting worse. Duration: Patient has had the wound for > 6 months prior to seeking treatment at the wound center Context: The wound would happen gradually Modifying Factors: Patient wound(s)/ulcer(s) are worsening due to :continual drainage from the wound ssociated Signs and Symptoms: Patient reports having increase discharge. A HPI Description: This patient returns after being seen here till the end of August and he was lost to follow-up. he has been quite debilitated laying in bed most of the time and his condition has deteriorated significantly. He has multiple ulcerations on the heel lateral forefoot and some of his toes. ======== Old notes: 74 year old male known to our practice when he was seen here in February and March and was lost to follow-up when he was admitted to hospital with various medical problems including coronary artery disease and a stroke. Now returns with the problem on the left forefoot where he has an ulceration and this has been there for about 6 months. most recently he was in hospital between July 6 and July 16, when he was admitted and treated for acute respiratory failure is secondary to aspiration pneumonia, large non-STEMI, ischemic cardiomyopathy with systolic and diastolic congestive heart failure with ejection fraction about 15-20%, ventricular tachycardia and has been treated with amiodarone, acute on careful up at the  common new acute CVA, acute chronic kidney disease stage III, anemia, uncontrolled diabetes mellitus with last hemoglobin A1c being 12%. He has had persistent hyperglycemia given recently. Patient has a past medical history of diabetes mellitus, hypertension, combined systolic and  [name] [name], [name] [name] ([id number]) Visit Report for 12/22/2019 Debridement Details Patient Name: Date of Service: [name] Grapes [facility] F. 12/22/2019 10:15 A M Medical Record Number: [id number] Patient Account Number: [id number] Date of Birth/Sex: Treating RN: [birth year] (74 y.o. [name] [name]) [name] [name] Primary Care Provider: [name], [name] Other Clinician: Referring Provider: Treating Provider/Extender: [name] [name] [name] [name] Weeks in Treatment: [name] Debridement Performed for Assessment: Wound #14 Left,Lateral Lower Leg Performed By: Physician [name] [name] Debridement Type: Debridement Severity of Tissue Pre Debridement: Fat layer exposed Level of Consciousness (Pre-procedure): Awake and Alert Pre-procedure Verification/Time Out Yes - 11:02 Taken: Start Time: 11:02 Pain Control: Lidocaine 5% topical ointment T Area Debrided (L x W): otal 5 (cm) x 2 (cm) = 10 (cm) Tissue and other material debrided: Viable, Non-Viable, Slough, Subcutaneous, Skin: Dermis , Skin: Epidermis, Slough Level: Skin/Subcutaneous Tissue Debridement Description: Excisional Instrument: Curette Specimen: Tissue Culture Number of Specimens T aken: 1 Bleeding: Moderate Hemostasis Achieved: Pressure End Time: 11:06 Procedural Pain: 0 Post Procedural Pain: 0 Response to Treatment: Procedure was tolerated well Level of Consciousness (Post- Awake and Alert procedure): Post Debridement Measurements of Total Wound Length: (cm) 11 Width: (cm) 2 Depth: (cm) 0.2 Volume: (cm) 3.456 Character of Wound/Ulcer Post Debridement: Improved Severity of Tissue Post Debridement: Fat layer exposed Post Procedure Diagnosis Same as Pre-procedure Electronic Signature(s) Signed: 12/22/2019 5:08:08 PM By: [name] Ham [name] Signed: 12/23/2019 10:37:46 AM By: [name] Coria RN Entered By: [name] [name] on 12/22/2019 12:56:09 -------------------------------------------------------------------------------- HPI Details Patient  Name: Date of Service: [name] [name] F. 12/22/2019 10:15 A M Medical Record Number: [id number] Patient Account Number: [id number] Date of Birth/Sex: Treating RN: [birth year] (74 y.o. [name] [name] Primary Care Provider: [name], [name] Other Clinician: Referring Provider: Treating Provider/Extender: [name] [name] [name] [name] Weeks in Treatment: [name] History of Present Illness Location: On the left and right lateral forefoot which has been there for about 6 months Quality: Patient reports No Pain. Severity: Patient states wound(s) are getting worse. Duration: Patient has had the wound for > 6 months prior to seeking treatment at the wound center Context: The wound would happen gradually Modifying Factors: Patient wound(s)/ulcer(s) are worsening due to :continual drainage from the wound ssociated Signs and Symptoms: Patient reports having increase discharge. A HPI Description: This patient returns after being seen here till the end of August and he was lost to follow-up. he has been quite debilitated laying in bed most of the time and his condition has deteriorated significantly. He has multiple ulcerations on the heel lateral forefoot and some of his toes. ======== Old notes: 74 year old male known to our practice when he was seen here in February and March and was lost to follow-up when he was admitted to hospital with various medical problems including coronary artery disease and a stroke. Now returns with the problem on the left forefoot where he has an ulceration and this has been there for about 6 months. most recently he was in hospital between July 6 and July 16, when he was admitted and treated for acute respiratory failure is secondary to aspiration pneumonia, large non-STEMI, ischemic cardiomyopathy with systolic and diastolic congestive heart failure with ejection fraction about 15-20%, ventricular tachycardia and has been treated with amiodarone, acute on careful up at the  common new acute CVA, acute chronic kidney disease stage III, anemia, uncontrolled diabetes mellitus with last hemoglobin A1c being 12%. He has had persistent hyperglycemia given recently. Patient has a past medical history of diabetes mellitus, hypertension, combined systolic and  [name] [name], [name] [name] ([id number]) Visit Report for 12/22/2019 Debridement Details Patient Name: Date of Service: [name] Grapes [facility] F. 12/22/2019 10:15 A M Medical Record Number: [id number] Patient Account Number: [id number] Date of Birth/Sex: Treating RN: [birth year] (74 y.o. [name] [name]) [name] [name] Primary Care Provider: [name], [name] Other Clinician: Referring Provider: Treating Provider/Extender: [name] [name] [name] [name] Weeks in Treatment: [name] Debridement Performed for Assessment: Wound #14 Left,Lateral Lower Leg Performed By: Physician [name] [name] Debridement Type: Debridement Severity of Tissue Pre Debridement: Fat layer exposed Level of Consciousness (Pre-procedure): Awake and Alert Pre-procedure Verification/Time Out Yes - 11:02 Taken: Start Time: 11:02 Pain Control: Lidocaine 5% topical ointment T Area Debrided (L x W): otal 5 (cm) x 2 (cm) = 10 (cm) Tissue and other material debrided: Viable, Non-Viable, Slough, Subcutaneous, Skin: Dermis , Skin: Epidermis, Slough Level: Skin/Subcutaneous Tissue Debridement Description: Excisional Instrument: Curette Specimen: Tissue Culture Number of Specimens T aken: 1 Bleeding: Moderate Hemostasis Achieved: Pressure End Time: 11:06 Procedural Pain: 0 Post Procedural Pain: 0 Response to Treatment: Procedure was tolerated well Level of Consciousness (Post- Awake and Alert procedure): Post Debridement Measurements of Total Wound Length: (cm) 11 Width: (cm) 2 Depth: (cm) 0.2 Volume: (cm) 3.456 Character of Wound/Ulcer Post Debridement: Improved Severity of Tissue Post Debridement: Fat layer exposed Post Procedure Diagnosis Same as Pre-procedure Electronic Signature(s) Signed: 12/22/2019 5:08:08 PM By: [name] Ham [name] Signed: 12/23/2019 10:37:46 AM By: [name] Coria RN Entered By: [name] [name] on 12/22/2019 12:56:09 -------------------------------------------------------------------------------- HPI Details Patient  Name: Date of Service: [name] [name] F. 12/22/2019 10:15 A M Medical Record Number: [id number] Patient Account Number: [id number] Date of Birth/Sex: Treating RN: [birth year] (74 y.o. [name] [name] Primary Care Provider: [name], [name] Other Clinician: Referring Provider: Treating Provider/Extender: [name] [name] [name] [name] Weeks in Treatment: [name] History of Present Illness Location: On the left and right lateral forefoot which has been there for about 6 months Quality: Patient reports No Pain. Severity: Patient states wound(s) are getting worse. Duration: Patient has had the wound for > 6 months prior to seeking treatment at the wound center Context: The wound would happen gradually Modifying Factors: Patient wound(s)/ulcer(s) are worsening due to :continual drainage from the wound ssociated Signs and Symptoms: Patient reports having increase discharge. A HPI Description: This patient returns after being seen here till the end of August and he was lost to follow-up. he has been quite debilitated laying in bed most of the time and his condition has deteriorated significantly. He has multiple ulcerations on the heel lateral forefoot and some of his toes. ======== Old notes: 74 year old male known to our practice when he was seen here in February and March and was lost to follow-up when he was admitted to hospital with various medical problems including coronary artery disease and a stroke. Now returns with the problem on the left forefoot where he has an ulceration and this has been there for about 6 months. most recently he was in hospital between July 6 and July 16, when he was admitted and treated for acute respiratory failure is secondary to aspiration pneumonia, large non-STEMI, ischemic cardiomyopathy with systolic and diastolic congestive heart failure with ejection fraction about 15-20%, ventricular tachycardia and has been treated with amiodarone, acute on careful up at the  common new acute CVA, acute chronic kidney disease stage III, anemia, uncontrolled diabetes mellitus with last hemoglobin A1c being 12%. He has had persistent hyperglycemia given recently. Patient has a past medical history of diabetes mellitus, hypertension, combined systolic and  [name] [name], [name] [name] ([id number]) Visit Report for 12/22/2019 Debridement Details Patient Name: Date of Service: [name] Grapes [facility] F. 12/22/2019 10:15 A M Medical Record Number: [id number] Patient Account Number: [id number] Date of Birth/Sex: Treating RN: [birth year] (74 y.o. [name] [name]) [name] [name] Primary Care Provider: [name], [name] Other Clinician: Referring Provider: Treating Provider/Extender: [name] [name] [name] [name] Weeks in Treatment: [name] Debridement Performed for Assessment: Wound #14 Left,Lateral Lower Leg Performed By: Physician [name] [name] Debridement Type: Debridement Severity of Tissue Pre Debridement: Fat layer exposed Level of Consciousness (Pre-procedure): Awake and Alert Pre-procedure Verification/Time Out Yes - 11:02 Taken: Start Time: 11:02 Pain Control: Lidocaine 5% topical ointment T Area Debrided (L x W): otal 5 (cm) x 2 (cm) = 10 (cm) Tissue and other material debrided: Viable, Non-Viable, Slough, Subcutaneous, Skin: Dermis , Skin: Epidermis, Slough Level: Skin/Subcutaneous Tissue Debridement Description: Excisional Instrument: Curette Specimen: Tissue Culture Number of Specimens T aken: 1 Bleeding: Moderate Hemostasis Achieved: Pressure End Time: 11:06 Procedural Pain: 0 Post Procedural Pain: 0 Response to Treatment: Procedure was tolerated well Level of Consciousness (Post- Awake and Alert procedure): Post Debridement Measurements of Total Wound Length: (cm) 11 Width: (cm) 2 Depth: (cm) 0.2 Volume: (cm) 3.456 Character of Wound/Ulcer Post Debridement: Improved Severity of Tissue Post Debridement: Fat layer exposed Post Procedure Diagnosis Same as Pre-procedure Electronic Signature(s) Signed: 12/22/2019 5:08:08 PM By: [name] Ham [name] Signed: 12/23/2019 10:37:46 AM By: [name] Coria RN Entered By: [name] [name] on 12/22/2019 12:56:09 -------------------------------------------------------------------------------- HPI Details Patient  Name: Date of Service: [name] [name] F. 12/22/2019 10:15 A M Medical Record Number: [id number] Patient Account Number: [id number] Date of Birth/Sex: Treating RN: [birth year] (74 y.o. [name] [name] Primary Care Provider: [name], [name] Other Clinician: Referring Provider: Treating Provider/Extender: [name] [name] [name] [name] Weeks in Treatment: [name] History of Present Illness Location: On the left and right lateral forefoot which has been there for about 6 months Quality: Patient reports No Pain. Severity: Patient states wound(s) are getting worse. Duration: Patient has had the wound for > 6 months prior to seeking treatment at the wound center Context: The wound would happen gradually Modifying Factors: Patient wound(s)/ulcer(s) are worsening due to :continual drainage from the wound ssociated Signs and Symptoms: Patient reports having increase discharge. A HPI Description: This patient returns after being seen here till the end of August and he was lost to follow-up. he has been quite debilitated laying in bed most of the time and his condition has deteriorated significantly. He has multiple ulcerations on the heel lateral forefoot and some of his toes. ======== Old notes: 74 year old male known to our practice when he was seen here in February and March and was lost to follow-up when he was admitted to hospital with various medical problems including coronary artery disease and a stroke. Now returns with the problem on the left forefoot where he has an ulceration and this has been there for about 6 months. most recently he was in hospital between July 6 and July 16, when he was admitted and treated for acute respiratory failure is secondary to aspiration pneumonia, large non-STEMI, ischemic cardiomyopathy with systolic and diastolic congestive heart failure with ejection fraction about 15-20%, ventricular tachycardia and has been treated with amiodarone, acute on careful up at the  common new acute CVA, acute chronic kidney disease stage III, anemia, uncontrolled diabetes mellitus with last hemoglobin A1c being 12%. He has had persistent hyperglycemia given recently. Patient has a past medical history of diabetes mellitus, hypertension, combined systolic and  wound with soap and water. Primary Wound Dressing: Wound #14 Left,Lateral Lower Leg: Calcium Alginate with Silver Wound #23 Right,Lateral Calcaneus: Calcium Alginate with Silver Wound #25 Right,Lateral Lower Leg: Calcium Alginate with Silver Wound #26 Left,Dorsal Foot: Calcium Alginate with Silver Secondary Dressing: Wound #14 Left,Lateral Lower Leg: Dry Gauze Wound #23 Right,Lateral Calcaneus: Dry Gauze Wound #25 Right,Lateral Lower Leg: Dry Gauze Wound #26 Left,Dorsal Foot: Dry Gauze Edema Control: Kerlix and Coban - Bilateral - use cotton roll not kerlix LIGHTLY Avoid standing for long periods of time Elevate legs to the level of the heart or above for 30 minutes daily and/or when sitting, a frequency of: - throughout the day Off-Loading: Multipodus Splint to: - patient to wear bunny boots to both feet while resting in chair and bed. Laboratory ordered were: Areobic culture-specimen not specified, LEFT LOWER LEG - PCR CULTURE 1. I think this man is relatively immobile he has pressure components as well as chronic venous insufficiency. I wonder how well his daughter puts on compression which is the other issue here. 2. I have done a deep tissue culture of the left lateral leg because her intake nurses reported purulent drainage and in the 6 weeks since I have seen this is a lot worse. No empiric antibiotics, the culture results should be back by the end of the week or  earlier 3. Silver alginate on all wound areas Electronic Signature(s) Signed: 12/22/2019 5:08:08 PM By: [name] Ham [name] Entered By: [name] [name] on 12/22/2019 13:02:46 -------------------------------------------------------------------------------- SuperBill Details Patient Name: Date of Service: [name] [name] F. 12/22/2019 Medical Record Number: [id number] Patient Account Number: [id number] Date of Birth/Sex: Treating RN: 1945-06-19 (74 y.o. [name] [name] Primary Care Provider: [name], [name] Other Clinician: Referring Provider: Treating Provider/Extender: [name] [name] Diagnosis Coding ICD-10 Codes Code Description [phone] Pressure ulcer of right heel, stage 3 L97.221 Non-pressure chronic ulcer of left calf limited to breakdown of skin L97.321 Non-pressure chronic ulcer of left ankle limited to breakdown of skin L97.521 Non-pressure chronic ulcer of other part of left foot limited to breakdown of skin L97.211 Non-pressure chronic ulcer of right calf limited to breakdown of skin Facility Procedures CPT4 Code: [id number] Description: 16579 - DEB SUBQ TISSUE 20 SQ CM/< ICD-10 Diagnosis Description L97.221 Non-pressure chronic ulcer of left calf limited to breakdown of skin Modifier: Quantity: 1 Physician Procedures : CPT4 Code Description Modifier [id number] - WC PHYS SUBQ TISS 20 SQ CM ICD-10 Diagnosis Description L97.221 Non-pressure chronic ulcer of left calf limited to breakdown of skin Quantity: 1 Electronic Signature(s) Signed: 12/22/2019 5:08:08 PM By: [name] Ham [name] Entered By: [name] [name] on 12/22/2019 13:03:06  Left,Lateral Lower Leg Dry Gauze Wound #23 Right,Lateral Calcaneus Dry Gauze Wound #25 Right,Lateral Lower Leg Dry Gauze Wound #26 Left,Dorsal Foot Dry Gauze Edema Control Kerlix and Coban - Bilateral - use cotton roll not kerlix LIGHTLY Avoid standing for long periods of time Elevate legs to the level of the heart or above for 30 minutes daily and/or when sitting, a frequency of: - throughout the day Off-Loading Multipodus Splint to: - patient to wear bunny boots to both feet while resting in chair and bed. Laboratory erobe culture (MICRO) - PCR CULTURE - (ICD10 L97.221 - Non-pressure chronic  ulcer of left Bacteria identified in Unspecified specimen by A calf limited to breakdown of skin) LOINC Code: 409-8 Convenience Name: Areobic culture-specimen not specified Electronic Signature(s) Signed: 12/22/2019 5:08:08 PM By: [name] Ham [name] Signed: 12/23/2019 10:37:46 AM By: [name] Coria RN Entered By: [name] [name] on 12/22/2019 11:10:17 -------------------------------------------------------------------------------- Problem List Details Patient Name: Date of Service: [name] [name] F. 12/22/2019 10:15 A M Medical Record Number: [id number] Patient Account Number: [id number] Date of Birth/Sex: Treating RN: 03/27/1946 (74 y.o. [name] [name] Primary Care Provider: [name], [name] Other Clinician: Referring Provider: Treating Provider/Extender: Roshon Duell [name] [name] Weeks in Treatment: [name] Active Problems ICD-10 Encounter Code Description Active Date MDM Diagnosis L89.613 Pressure ulcer of right heel, stage 3 04/17/2016 No Yes L97.221 Non-pressure chronic ulcer of left calf limited to breakdown of skin 08/15/2017 No Yes L97.321 Non-pressure chronic ulcer of left ankle limited to breakdown of skin 07/01/2018 No Yes L97.521 Non-pressure chronic ulcer of other part of left foot limited to breakdown of 02/17/2019 No Yes skin L97.211 Non-pressure chronic ulcer of right calf limited to breakdown of skin 07/21/2019 No Yes Inactive Problems ICD-10 Code Description Active Date Inactive Date E11.621 Type 2 diabetes mellitus with foot ulcer 04/17/2016 04/17/2016 L03.032 Cellulitis of left toe 01/06/2019 01/06/2019 F62.130 Pressure ulcer of left heel, stage 3 12/04/2016 12/04/2016 I25.119 Atherosclerotic heart disease of native coronary artery with unspecified angina 04/17/2016 04/17/2016 pectoris L97.821 Non-pressure chronic ulcer of other part of left lower leg limited to breakdown of skin 01/06/2019 01/06/2019 S51.811D Laceration without foreign body of right forearm, subsequent  encounter 10/22/2017 10/22/2017 Q65.78 Acute diastolic (congestive) heart failure 04/17/2016 04/17/2016 L03.116 Cellulitis of left lower limb 12/24/2017 12/24/2017 L89.620 Pressure ulcer of left heel, unstageable 07/21/2019 07/21/2019 S80.211D Abrasion, right knee, subsequent encounter 08/18/2019 08/18/2019 Resolved Problems ICD-10 Code Description Active Date Resolved Date L89.512 Pressure ulcer of right ankle, stage 2 04/17/2016 04/17/2016 L89.522 Pressure ulcer of left ankle, stage 2 04/17/2016 04/17/2016 Electronic Signature(s) Signed: 12/22/2019 5:08:08 PM By: [name] Ham [name] Entered By: [name] [name] on 12/22/2019 12:54:47 -------------------------------------------------------------------------------- Progress Note Details Patient Name: Date of Service: [name] [name] F. 12/22/2019 10:15 A M Medical Record Number: [id number] Patient Account Number: [id number] Date of Birth/Sex: Treating RN: [birth year] (74 y.o. [name] [name] Primary Care Provider: [name], [name] Other Clinician: Referring Provider: Treating Provider/Extender: Eleora Sutherland [name] [name] Weeks in Treatment: [name] Subjective History of Present Illness (HPI) The following HPI elements were documented for the patient's wound: Location: On the left and right lateral forefoot which has been there for about 6 months Quality: Patient reports No Pain. Severity: Patient states wound(s) are getting worse. Duration: Patient has had the wound for > 6 months prior to seeking treatment at the wound center Context: The wound would happen gradually Modifying Factors: Patient wound(s)/ulcer(s) are worsening due to :continual drainage from the wound Associated Signs and Symptoms: Patient reports having increase discharge. This patient  [name] [name], [name] [name] ([id number]) Visit Report for 12/22/2019 Debridement Details Patient Name: Date of Service: [name] Grapes [facility] F. 12/22/2019 10:15 A M Medical Record Number: [id number] Patient Account Number: [id number] Date of Birth/Sex: Treating RN: [birth year] (74 y.o. [name] [name]) [name] [name] Primary Care Provider: [name], [name] Other Clinician: Referring Provider: Treating Provider/Extender: [name] [name] [name] [name] Weeks in Treatment: [name] Debridement Performed for Assessment: Wound #14 Left,Lateral Lower Leg Performed By: Physician [name] [name] Debridement Type: Debridement Severity of Tissue Pre Debridement: Fat layer exposed Level of Consciousness (Pre-procedure): Awake and Alert Pre-procedure Verification/Time Out Yes - 11:02 Taken: Start Time: 11:02 Pain Control: Lidocaine 5% topical ointment T Area Debrided (L x W): otal 5 (cm) x 2 (cm) = 10 (cm) Tissue and other material debrided: Viable, Non-Viable, Slough, Subcutaneous, Skin: Dermis , Skin: Epidermis, Slough Level: Skin/Subcutaneous Tissue Debridement Description: Excisional Instrument: Curette Specimen: Tissue Culture Number of Specimens T aken: 1 Bleeding: Moderate Hemostasis Achieved: Pressure End Time: 11:06 Procedural Pain: 0 Post Procedural Pain: 0 Response to Treatment: Procedure was tolerated well Level of Consciousness (Post- Awake and Alert procedure): Post Debridement Measurements of Total Wound Length: (cm) 11 Width: (cm) 2 Depth: (cm) 0.2 Volume: (cm) 3.456 Character of Wound/Ulcer Post Debridement: Improved Severity of Tissue Post Debridement: Fat layer exposed Post Procedure Diagnosis Same as Pre-procedure Electronic Signature(s) Signed: 12/22/2019 5:08:08 PM By: [name] Ham [name] Signed: 12/23/2019 10:37:46 AM By: [name] Coria RN Entered By: [name] [name] on 12/22/2019 12:56:09 -------------------------------------------------------------------------------- HPI Details Patient  Name: Date of Service: [name] [name] F. 12/22/2019 10:15 A M Medical Record Number: [id number] Patient Account Number: [id number] Date of Birth/Sex: Treating RN: [birth year] (74 y.o. [name] [name] Primary Care Provider: [name], [name] Other Clinician: Referring Provider: Treating Provider/Extender: [name] [name] [name] [name] Weeks in Treatment: [name] History of Present Illness Location: On the left and right lateral forefoot which has been there for about 6 months Quality: Patient reports No Pain. Severity: Patient states wound(s) are getting worse. Duration: Patient has had the wound for > 6 months prior to seeking treatment at the wound center Context: The wound would happen gradually Modifying Factors: Patient wound(s)/ulcer(s) are worsening due to :continual drainage from the wound ssociated Signs and Symptoms: Patient reports having increase discharge. A HPI Description: This patient returns after being seen here till the end of August and he was lost to follow-up. he has been quite debilitated laying in bed most of the time and his condition has deteriorated significantly. He has multiple ulcerations on the heel lateral forefoot and some of his toes. ======== Old notes: 74 year old male known to our practice when he was seen here in February and March and was lost to follow-up when he was admitted to hospital with various medical problems including coronary artery disease and a stroke. Now returns with the problem on the left forefoot where he has an ulceration and this has been there for about 6 months. most recently he was in hospital between July 6 and July 16, when he was admitted and treated for acute respiratory failure is secondary to aspiration pneumonia, large non-STEMI, ischemic cardiomyopathy with systolic and diastolic congestive heart failure with ejection fraction about 15-20%, ventricular tachycardia and has been treated with amiodarone, acute on careful up at the  common new acute CVA, acute chronic kidney disease stage III, anemia, uncontrolled diabetes mellitus with last hemoglobin A1c being 12%. He has had persistent hyperglycemia given recently. Patient has a past medical history of diabetes mellitus, hypertension, combined systolic and  wound with soap and water. Primary Wound Dressing: Wound #14 Left,Lateral Lower Leg: Calcium Alginate with Silver Wound #23 Right,Lateral Calcaneus: Calcium Alginate with Silver Wound #25 Right,Lateral Lower Leg: Calcium Alginate with Silver Wound #26 Left,Dorsal Foot: Calcium Alginate with Silver Secondary Dressing: Wound #14 Left,Lateral Lower Leg: Dry Gauze Wound #23 Right,Lateral Calcaneus: Dry Gauze Wound #25 Right,Lateral Lower Leg: Dry Gauze Wound #26 Left,Dorsal Foot: Dry Gauze Edema Control: Kerlix and Coban - Bilateral - use cotton roll not kerlix LIGHTLY Avoid standing for long periods of time Elevate legs to the level of the heart or above for 30 minutes daily and/or when sitting, a frequency of: - throughout the day Off-Loading: Multipodus Splint to: - patient to wear bunny boots to both feet while resting in chair and bed. Laboratory ordered were: Areobic culture-specimen not specified, LEFT LOWER LEG - PCR CULTURE 1. I think this man is relatively immobile he has pressure components as well as chronic venous insufficiency. I wonder how well his daughter puts on compression which is the other issue here. 2. I have done a deep tissue culture of the left lateral leg because her intake nurses reported purulent drainage and in the 6 weeks since I have seen this is a lot worse. No empiric antibiotics, the culture results should be back by the end of the week or  earlier 3. Silver alginate on all wound areas Electronic Signature(s) Signed: 12/22/2019 5:08:08 PM By: [name] Ham [name] Entered By: [name] [name] on 12/22/2019 13:02:46 -------------------------------------------------------------------------------- SuperBill Details Patient Name: Date of Service: [name] [name] F. 12/22/2019 Medical Record Number: [id number] Patient Account Number: [id number] Date of Birth/Sex: Treating RN: 1945-06-19 (74 y.o. [name] [name] Primary Care Provider: [name], [name] Other Clinician: Referring Provider: Treating Provider/Extender: [name] [name] Diagnosis Coding ICD-10 Codes Code Description [phone] Pressure ulcer of right heel, stage 3 L97.221 Non-pressure chronic ulcer of left calf limited to breakdown of skin L97.321 Non-pressure chronic ulcer of left ankle limited to breakdown of skin L97.521 Non-pressure chronic ulcer of other part of left foot limited to breakdown of skin L97.211 Non-pressure chronic ulcer of right calf limited to breakdown of skin Facility Procedures CPT4 Code: [id number] Description: 16579 - DEB SUBQ TISSUE 20 SQ CM/< ICD-10 Diagnosis Description L97.221 Non-pressure chronic ulcer of left calf limited to breakdown of skin Modifier: Quantity: 1 Physician Procedures : CPT4 Code Description Modifier [id number] 32919 - WC PHYS SUBQ TISS 20 SQ CM ICD-10 Diagnosis Description L97.221 Non-pressure chronic ulcer of left calf limited to breakdown of skin Quantity: 1 Electronic Signature(s) Signed: 12/22/2019 5:08:08 PM By: [name] Ham [name] Entered By: [name] [name] on 12/22/2019 13:03:06  [name] [name], [name] [name] ([id number]) Visit Report for 12/22/2019 Debridement Details Patient Name: Date of Service: [name] Grapes [facility] F. 12/22/2019 10:15 A M Medical Record Number: [id number] Patient Account Number: [id number] Date of Birth/Sex: Treating RN: [birth year] (74 y.o. [name] [name]) [name] [name] Primary Care Provider: [name], [name] Other Clinician: Referring Provider: Treating Provider/Extender: [name] [name] [name] [name] Weeks in Treatment: [name] Debridement Performed for Assessment: Wound #14 Left,Lateral Lower Leg Performed By: Physician [name] [name] Debridement Type: Debridement Severity of Tissue Pre Debridement: Fat layer exposed Level of Consciousness (Pre-procedure): Awake and Alert Pre-procedure Verification/Time Out Yes - 11:02 Taken: Start Time: 11:02 Pain Control: Lidocaine 5% topical ointment T Area Debrided (L x W): otal 5 (cm) x 2 (cm) = 10 (cm) Tissue and other material debrided: Viable, Non-Viable, Slough, Subcutaneous, Skin: Dermis , Skin: Epidermis, Slough Level: Skin/Subcutaneous Tissue Debridement Description: Excisional Instrument: Curette Specimen: Tissue Culture Number of Specimens T aken: 1 Bleeding: Moderate Hemostasis Achieved: Pressure End Time: 11:06 Procedural Pain: 0 Post Procedural Pain: 0 Response to Treatment: Procedure was tolerated well Level of Consciousness (Post- Awake and Alert procedure): Post Debridement Measurements of Total Wound Length: (cm) 11 Width: (cm) 2 Depth: (cm) 0.2 Volume: (cm) 3.456 Character of Wound/Ulcer Post Debridement: Improved Severity of Tissue Post Debridement: Fat layer exposed Post Procedure Diagnosis Same as Pre-procedure Electronic Signature(s) Signed: 12/22/2019 5:08:08 PM By: [name] Ham [name] Signed: 12/23/2019 10:37:46 AM By: [name] Coria RN Entered By: [name] [name] on 12/22/2019 12:56:09 -------------------------------------------------------------------------------- HPI Details Patient  Name: Date of Service: [name] [name] F. 12/22/2019 10:15 A M Medical Record Number: [id number] Patient Account Number: [id number] Date of Birth/Sex: Treating RN: [birth year] (74 y.o. [name] [name] Primary Care Provider: [name], [name] Other Clinician: Referring Provider: Treating Provider/Extender: [name] [name] [name] [name] Weeks in Treatment: [name] History of Present Illness Location: On the left and right lateral forefoot which has been there for about 6 months Quality: Patient reports No Pain. Severity: Patient states wound(s) are getting worse. Duration: Patient has had the wound for > 6 months prior to seeking treatment at the wound center Context: The wound would happen gradually Modifying Factors: Patient wound(s)/ulcer(s) are worsening due to :continual drainage from the wound ssociated Signs and Symptoms: Patient reports having increase discharge. A HPI Description: This patient returns after being seen here till the end of August and he was lost to follow-up. he has been quite debilitated laying in bed most of the time and his condition has deteriorated significantly. He has multiple ulcerations on the heel lateral forefoot and some of his toes. ======== Old notes: 74 year old male known to our practice when he was seen here in February and March and was lost to follow-up when he was admitted to hospital with various medical problems including coronary artery disease and a stroke. Now returns with the problem on the left forefoot where he has an ulceration and this has been there for about 6 months. most recently he was in hospital between July 6 and July 16, when he was admitted and treated for acute respiratory failure is secondary to aspiration pneumonia, large non-STEMI, ischemic cardiomyopathy with systolic and diastolic congestive heart failure with ejection fraction about 15-20%, ventricular tachycardia and has been treated with amiodarone, acute on careful up at the  common new acute CVA, acute chronic kidney disease stage III, anemia, uncontrolled diabetes mellitus with last hemoglobin A1c being 12%. He has had persistent hyperglycemia given recently. Patient has a past medical history of diabetes mellitus, hypertension, combined systolic and  [name] [name], [name] [name] ([id number]) Visit Report for 12/22/2019 Debridement Details Patient Name: Date of Service: [name] Grapes [facility] F. 12/22/2019 10:15 A M Medical Record Number: [id number] Patient Account Number: [id number] Date of Birth/Sex: Treating RN: [birth year] (74 y.o. [name] [name]) [name] [name] Primary Care Provider: [name], [name] Other Clinician: Referring Provider: Treating Provider/Extender: [name] [name] [name] [name] Weeks in Treatment: [name] Debridement Performed for Assessment: Wound #14 Left,Lateral Lower Leg Performed By: Physician [name] [name] Debridement Type: Debridement Severity of Tissue Pre Debridement: Fat layer exposed Level of Consciousness (Pre-procedure): Awake and Alert Pre-procedure Verification/Time Out Yes - 11:02 Taken: Start Time: 11:02 Pain Control: Lidocaine 5% topical ointment T Area Debrided (L x W): otal 5 (cm) x 2 (cm) = 10 (cm) Tissue and other material debrided: Viable, Non-Viable, Slough, Subcutaneous, Skin: Dermis , Skin: Epidermis, Slough Level: Skin/Subcutaneous Tissue Debridement Description: Excisional Instrument: Curette Specimen: Tissue Culture Number of Specimens T aken: 1 Bleeding: Moderate Hemostasis Achieved: Pressure End Time: 11:06 Procedural Pain: 0 Post Procedural Pain: 0 Response to Treatment: Procedure was tolerated well Level of Consciousness (Post- Awake and Alert procedure): Post Debridement Measurements of Total Wound Length: (cm) 11 Width: (cm) 2 Depth: (cm) 0.2 Volume: (cm) 3.456 Character of Wound/Ulcer Post Debridement: Improved Severity of Tissue Post Debridement: Fat layer exposed Post Procedure Diagnosis Same as Pre-procedure Electronic Signature(s) Signed: 12/22/2019 5:08:08 PM By: [name] Ham [name] Signed: 12/23/2019 10:37:46 AM By: [name] Coria RN Entered By: [name] [name] on 12/22/2019 12:56:09 -------------------------------------------------------------------------------- HPI Details Patient  Name: Date of Service: [name] [name] F. 12/22/2019 10:15 A M Medical Record Number: [id number] Patient Account Number: [id number] Date of Birth/Sex: Treating RN: [birth year] (74 y.o. [name] [name] Primary Care Provider: [name], [name] Other Clinician: Referring Provider: Treating Provider/Extender: [name] [name] [name] [name] Weeks in Treatment: [name] History of Present Illness Location: On the left and right lateral forefoot which has been there for about 6 months Quality: Patient reports No Pain. Severity: Patient states wound(s) are getting worse. Duration: Patient has had the wound for > 6 months prior to seeking treatment at the wound center Context: The wound would happen gradually Modifying Factors: Patient wound(s)/ulcer(s) are worsening due to :continual drainage from the wound ssociated Signs and Symptoms: Patient reports having increase discharge. A HPI Description: This patient returns after being seen here till the end of August and he was lost to follow-up. he has been quite debilitated laying in bed most of the time and his condition has deteriorated significantly. He has multiple ulcerations on the heel lateral forefoot and some of his toes. ======== Old notes: 74 year old male known to our practice when he was seen here in February and March and was lost to follow-up when he was admitted to hospital with various medical problems including coronary artery disease and a stroke. Now returns with the problem on the left forefoot where he has an ulceration and this has been there for about 6 months. most recently he was in hospital between July 6 and July 16, when he was admitted and treated for acute respiratory failure is secondary to aspiration pneumonia, large non-STEMI, ischemic cardiomyopathy with systolic and diastolic congestive heart failure with ejection fraction about 15-20%, ventricular tachycardia and has been treated with amiodarone, acute on careful up at the  common new acute CVA, acute chronic kidney disease stage III, anemia, uncontrolled diabetes mellitus with last hemoglobin A1c being 12%. He has had persistent hyperglycemia given recently. Patient has a past medical history of diabetes mellitus, hypertension, combined systolic and  [name] [name], [name] [name] ([id number]) Visit Report for 12/22/2019 Debridement Details Patient Name: Date of Service: [name] Grapes [facility] F. 12/22/2019 10:15 A M Medical Record Number: [id number] Patient Account Number: [id number] Date of Birth/Sex: Treating RN: [birth year] (74 y.o. [name] [name]) [name] [name] Primary Care Provider: [name], [name] Other Clinician: Referring Provider: Treating Provider/Extender: [name] [name] [name] [name] Weeks in Treatment: [name] Debridement Performed for Assessment: Wound #14 Left,Lateral Lower Leg Performed By: Physician [name] [name] Debridement Type: Debridement Severity of Tissue Pre Debridement: Fat layer exposed Level of Consciousness (Pre-procedure): Awake and Alert Pre-procedure Verification/Time Out Yes - 11:02 Taken: Start Time: 11:02 Pain Control: Lidocaine 5% topical ointment T Area Debrided (L x W): otal 5 (cm) x 2 (cm) = 10 (cm) Tissue and other material debrided: Viable, Non-Viable, Slough, Subcutaneous, Skin: Dermis , Skin: Epidermis, Slough Level: Skin/Subcutaneous Tissue Debridement Description: Excisional Instrument: Curette Specimen: Tissue Culture Number of Specimens T aken: 1 Bleeding: Moderate Hemostasis Achieved: Pressure End Time: 11:06 Procedural Pain: 0 Post Procedural Pain: 0 Response to Treatment: Procedure was tolerated well Level of Consciousness (Post- Awake and Alert procedure): Post Debridement Measurements of Total Wound Length: (cm) 11 Width: (cm) 2 Depth: (cm) 0.2 Volume: (cm) 3.456 Character of Wound/Ulcer Post Debridement: Improved Severity of Tissue Post Debridement: Fat layer exposed Post Procedure Diagnosis Same as Pre-procedure Electronic Signature(s) Signed: 12/22/2019 5:08:08 PM By: [name] Ham [name] Signed: 12/23/2019 10:37:46 AM By: [name] Coria RN Entered By: [name] [name] on 12/22/2019 12:56:09 -------------------------------------------------------------------------------- HPI Details Patient  Name: Date of Service: [name] [name] F. 12/22/2019 10:15 A M Medical Record Number: [id number] Patient Account Number: [id number] Date of Birth/Sex: Treating RN: [birth year] (74 y.o. [name] [name] Primary Care Provider: [name], [name] Other Clinician: Referring Provider: Treating Provider/Extender: [name] [name] [name] [name] Weeks in Treatment: [name] History of Present Illness Location: On the left and right lateral forefoot which has been there for about 6 months Quality: Patient reports No Pain. Severity: Patient states wound(s) are getting worse. Duration: Patient has had the wound for > 6 months prior to seeking treatment at the wound center Context: The wound would happen gradually Modifying Factors: Patient wound(s)/ulcer(s) are worsening due to :continual drainage from the wound ssociated Signs and Symptoms: Patient reports having increase discharge. A HPI Description: This patient returns after being seen here till the end of August and he was lost to follow-up. he has been quite debilitated laying in bed most of the time and his condition has deteriorated significantly. He has multiple ulcerations on the heel lateral forefoot and some of his toes. ======== Old notes: 74 year old male known to our practice when he was seen here in February and March and was lost to follow-up when he was admitted to hospital with various medical problems including coronary artery disease and a stroke. Now returns with the problem on the left forefoot where he has an ulceration and this has been there for about 6 months. most recently he was in hospital between July 6 and July 16, when he was admitted and treated for acute respiratory failure is secondary to aspiration pneumonia, large non-STEMI, ischemic cardiomyopathy with systolic and diastolic congestive heart failure with ejection fraction about 15-20%, ventricular tachycardia and has been treated with amiodarone, acute on careful up at the  common new acute CVA, acute chronic kidney disease stage III, anemia, uncontrolled diabetes mellitus with last hemoglobin A1c being 12%. He has had persistent hyperglycemia given recently. Patient has a past medical history of diabetes mellitus, hypertension, combined systolic and  [name] [name], [name] [name] ([id number]) Visit Report for 12/22/2019 Debridement Details Patient Name: Date of Service: [name] Grapes [facility] F. 12/22/2019 10:15 A M Medical Record Number: [id number] Patient Account Number: [id number] Date of Birth/Sex: Treating RN: [birth year] (74 y.o. [name] [name]) [name] [name] Primary Care Provider: [name], [name] Other Clinician: Referring Provider: Treating Provider/Extender: [name] [name] [name] [name] Weeks in Treatment: [name] Debridement Performed for Assessment: Wound #14 Left,Lateral Lower Leg Performed By: Physician [name] [name] Debridement Type: Debridement Severity of Tissue Pre Debridement: Fat layer exposed Level of Consciousness (Pre-procedure): Awake and Alert Pre-procedure Verification/Time Out Yes - 11:02 Taken: Start Time: 11:02 Pain Control: Lidocaine 5% topical ointment T Area Debrided (L x W): otal 5 (cm) x 2 (cm) = 10 (cm) Tissue and other material debrided: Viable, Non-Viable, Slough, Subcutaneous, Skin: Dermis , Skin: Epidermis, Slough Level: Skin/Subcutaneous Tissue Debridement Description: Excisional Instrument: Curette Specimen: Tissue Culture Number of Specimens T aken: 1 Bleeding: Moderate Hemostasis Achieved: Pressure End Time: 11:06 Procedural Pain: 0 Post Procedural Pain: 0 Response to Treatment: Procedure was tolerated well Level of Consciousness (Post- Awake and Alert procedure): Post Debridement Measurements of Total Wound Length: (cm) 11 Width: (cm) 2 Depth: (cm) 0.2 Volume: (cm) 3.456 Character of Wound/Ulcer Post Debridement: Improved Severity of Tissue Post Debridement: Fat layer exposed Post Procedure Diagnosis Same as Pre-procedure Electronic Signature(s) Signed: 12/22/2019 5:08:08 PM By: [name] Ham [name] Signed: 12/23/2019 10:37:46 AM By: [name] Coria RN Entered By: [name] [name] on 12/22/2019 12:56:09 -------------------------------------------------------------------------------- HPI Details Patient  Name: Date of Service: [name] [name] F. 12/22/2019 10:15 A M Medical Record Number: [id number] Patient Account Number: [id number] Date of Birth/Sex: Treating RN: [birth year] (74 y.o. [name] [name] Primary Care Provider: [name], [name] Other Clinician: Referring Provider: Treating Provider/Extender: [name] [name] [name] [name] Weeks in Treatment: [name] History of Present Illness Location: On the left and right lateral forefoot which has been there for about 6 months Quality: Patient reports No Pain. Severity: Patient states wound(s) are getting worse. Duration: Patient has had the wound for > 6 months prior to seeking treatment at the wound center Context: The wound would happen gradually Modifying Factors: Patient wound(s)/ulcer(s) are worsening due to :continual drainage from the wound ssociated Signs and Symptoms: Patient reports having increase discharge. A HPI Description: This patient returns after being seen here till the end of August and he was lost to follow-up. he has been quite debilitated laying in bed most of the time and his condition has deteriorated significantly. He has multiple ulcerations on the heel lateral forefoot and some of his toes. ======== Old notes: 74 year old male known to our practice when he was seen here in February and March and was lost to follow-up when he was admitted to hospital with various medical problems including coronary artery disease and a stroke. Now returns with the problem on the left forefoot where he has an ulceration and this has been there for about 6 months. most recently he was in hospital between July 6 and July 16, when he was admitted and treated for acute respiratory failure is secondary to aspiration pneumonia, large non-STEMI, ischemic cardiomyopathy with systolic and diastolic congestive heart failure with ejection fraction about 15-20%, ventricular tachycardia and has been treated with amiodarone, acute on careful up at the  common new acute CVA, acute chronic kidney disease stage III, anemia, uncontrolled diabetes mellitus with last hemoglobin A1c being 12%. He has had persistent hyperglycemia given recently. Patient has a past medical history of diabetes mellitus, hypertension, combined systolic and

## 2019-12-23 NOTE — Progress Notes (Signed)
Status: Date Acquired: 08/18/2019 Comorbid Anemia, Sleep Apnea, Arrhythmia, Congestive Heart Failure, Weeks Of Treatment: 18 History: Coronary Artery Disease, Hypertension, Myocardial Infarction, Clustered Wound: Yes Type II Diabetes, Gout, Neuropathy Photos Photo Uploaded By: Mikeal Hawthorne on 12/23/2019 11:11:37 Wound Measurements Length: (cm) 6 Width: (cm) 3.7 Depth: (cm) 0.3 Clustered Quantity: 4 Area: (cm) 17.436 Volume: (cm) 5.231 % Reduction in Area: -3070.2% % Reduction in Volume: -9410.9% Epithelialization: Small (1-33%) Tunneling: No Undermining: No Wound Description Classification: Grade 2 Wound Margin: Flat and Intact Exudate Amount: Medium Exudate Type: Serosanguineous Exudate Color: red, brown Foul Odor After Cleansing: No Slough/Fibrino Yes Wound Bed Granulation Amount: Medium (34-66%) Exposed Structure Granulation Quality: Red Fascia Exposed: No Necrotic Amount: Medium (34-66%) Fat Layer (Subcutaneous Tissue) Exposed: Yes Necrotic Quality: Adherent Slough Tendon Exposed: No Muscle Exposed: No Joint Exposed: No Bone  Exposed: No Treatment Notes Wound #26 (Left, Dorsal Foot) 1. Cleanse With Soap and water 2. Periwound Care TCA Cream 3. Primary Dressing Applied Calcium Alginate Ag 4. Secondary Dressing Dry Gauze 6. Support Layer Holiday representative) Signed: 12/22/2019 4:57:23 PM By: Baruch Gouty RN, BSN Entered By: Baruch Gouty on 12/22/2019 10:41:16 -------------------------------------------------------------------------------- [place] Details Patient Name: Date of Service: [name] [name] F. 12/22/2019 10:15 A M Medical Record Number: [id number] Patient Account Number: [id number] Date of Birth/Sex: Treating RN: Nov 13, 1945 (74 y.o. [name] Primary Care Jen Eppinger: [name], [name] Other Clinician: Referring Aneesa Romey: Treating Matti Killingsworth/Extender: [name], [name] [name], [name] Weeks in Treatment: 192 Vital Signs Time Taken: 10:15 Temperature (F): 98.1 Height (in): 74 Pulse (bpm): 77 Source: Stated Respiratory Rate (breaths/min): 18 Weight (lbs): 150 Blood Pressure (mmHg): 109/68 Source: Stated Capillary Blood Glucose (mg/dl): 117 Body Mass Index (BMI): 19.3 Reference Range: 80 - 120 mg / dl Notes glucose per pt report Electronic Signature(s) Signed: 12/22/2019 4:57:23 PM By: Baruch Gouty RN, BSN Entered By: Baruch Gouty on 12/22/2019 10:16:37  [name] Primary Care Verta Riedlinger: [name], [name] Other Clinician: Referring Audray Rumore: Treating Uva Runkel/Extender: [name], [name] [name], [name] Weeks in Treatment: 192 Wound Status Wound Number: 23 Primary Diabetic Wound/Ulcer of the Lower Extremity Etiology: Wound Location: Right, Lateral Calcaneus Secondary Pressure Ulcer Wounding Event: Pressure Injury Etiology: Date Acquired: 04/28/2019 Wound Open Weeks Of Treatment: 34 Status: Clustered Wound: No Comorbid Anemia, Sleep Apnea, Arrhythmia, Congestive Heart Failure, History: Coronary Artery Disease, Hypertension, Myocardial Infarction, Type II Diabetes, Gout, Neuropathy Photos Photo Uploaded By: Mikeal Hawthorne on 12/23/2019 11:11:06 Wound Measurements Length: (cm) 0.3 Width: (cm) 0.7 Depth: (cm) 0.1 Area: (cm) 0.165 Volume: (cm) 0.016 % Reduction in Area: 97.2% % Reduction in Volume: 97.3% Epithelialization: Medium (34-66%) Tunneling: No Undermining: No Wound Description Classification: Grade 2 Wound Margin: Flat and Intact Exudate Amount: Small Exudate Type: Serosanguineous Exudate Color: red, brown Foul Odor After Cleansing: No Slough/Fibrino Yes Wound Bed Granulation Amount: Large (67-100%) Exposed Structure Granulation Quality: Red Fascia Exposed: No Necrotic Amount: None Present (0%) Fat Layer (Subcutaneous Tissue) Exposed: Yes Tendon Exposed: No Muscle Exposed: No Joint Exposed: No Bone Exposed: No Treatment Notes Wound #23 (Right, Lateral Calcaneus) 1. Cleanse With Soap and water 2. Periwound Care TCA Cream 3. Primary Dressing Applied Calcium Alginate Ag 4. Secondary Dressing Dry Gauze 6. Support Layer Holiday representative) Signed: 12/22/2019 4:57:23 PM By: Baruch Gouty RN, BSN Entered By: Baruch Gouty on 12/22/2019  10:45:36 -------------------------------------------------------------------------------- Wound Assessment Details Patient Name: Date of Service: [name] [name] F. 12/22/2019 10:15 A M Medical Record Number: [id number] Patient Account Number: [id number] Date of Birth/Sex: Treating RN: 02-06-1946 (74 y.o. [name] Primary Care Sparrow Sanzo: [name], [name] Other Clinician: Referring Reaghan Kawa: Treating Willim Turnage/Extender: [name], [name] [name], [name] Weeks in Treatment: 192 Wound Status Wound Number: 25 Primary Venous Leg Ulcer Etiology: Wound Location: Right, Lateral Lower Leg Wound Open Wounding Event: Gradually Appeared Status: Date Acquired: 07/06/2019 Comorbid Anemia, Sleep Apnea, Arrhythmia, Congestive Heart Failure, Weeks Of Treatment: 22 History: Coronary Artery Disease, Hypertension, Myocardial Infarction, Clustered Wound: No Type II Diabetes, Gout, Neuropathy Photos Photo Uploaded By: Mikeal Hawthorne on 12/23/2019 11:11:06 Wound Measurements Length: (cm) 2 Width: (cm) 1.5 Depth: (cm) 0.1 Area: (cm) 2.356 Volume: (cm) 0.236 % Reduction in Area: 13.8% % Reduction in Volume: 13.6% Epithelialization: Small (1-33%) Tunneling: No Undermining: No Wound Description Classification: Full Thickness Without Exposed Support Structures Wound Margin: Flat and Intact Exudate Amount: Medium Exudate Type: Serosanguineous Exudate Color: red, brown Foul Odor After Cleansing: No Slough/Fibrino Yes Wound Bed Granulation Amount: Medium (34-66%) Exposed Structure Granulation Quality: Red Fascia Exposed: No Necrotic Amount: Medium (34-66%) Fat Layer (Subcutaneous Tissue) Exposed: Yes Necrotic Quality: Adherent Slough Tendon Exposed: No Muscle Exposed: No Joint Exposed: No Bone Exposed: No Treatment Notes Wound #25 (Right, Lateral Lower Leg) 1. Cleanse With Soap and water 2. Periwound Care TCA Cream 3. Primary Dressing Applied Calcium Alginate Ag 4. Secondary  Dressing Dry Gauze 6. Support Layer Holiday representative) Signed: 12/22/2019 4:57:23 PM By: Baruch Gouty RN, BSN Entered By: Baruch Gouty on 12/22/2019 10:45:57 -------------------------------------------------------------------------------- Wound Assessment Details Patient Name: Date of Service: [name] [name] F. 12/22/2019 10:15 A M Medical Record Number: [id number] Patient Account Number: [id number] Date of Birth/Sex: Treating RN: 12-12-1945 (74 y.o. [name] Primary Care Aireana Ryland: [name], [name] Other Clinician: Referring Azarian Starace: Treating Sincerity Cedar/Extender: [name], [name] [name], [name] Weeks in Treatment: 192 Wound Status Wound Number: 26 Primary Diabetic Wound/Ulcer of the Lower Extremity Etiology: Wound Location: Left, Dorsal Foot Wound Open Wounding Event: Gradually Appeared  [name] Primary Care Verta Riedlinger: [name], [name] Other Clinician: Referring Audray Rumore: Treating Uva Runkel/Extender: [name], [name] [name], [name] Weeks in Treatment: 192 Wound Status Wound Number: 23 Primary Diabetic Wound/Ulcer of the Lower Extremity Etiology: Wound Location: Right, Lateral Calcaneus Secondary Pressure Ulcer Wounding Event: Pressure Injury Etiology: Date Acquired: 04/28/2019 Wound Open Weeks Of Treatment: 34 Status: Clustered Wound: No Comorbid Anemia, Sleep Apnea, Arrhythmia, Congestive Heart Failure, History: Coronary Artery Disease, Hypertension, Myocardial Infarction, Type II Diabetes, Gout, Neuropathy Photos Photo Uploaded By: Mikeal Hawthorne on 12/23/2019 11:11:06 Wound Measurements Length: (cm) 0.3 Width: (cm) 0.7 Depth: (cm) 0.1 Area: (cm) 0.165 Volume: (cm) 0.016 % Reduction in Area: 97.2% % Reduction in Volume: 97.3% Epithelialization: Medium (34-66%) Tunneling: No Undermining: No Wound Description Classification: Grade 2 Wound Margin: Flat and Intact Exudate Amount: Small Exudate Type: Serosanguineous Exudate Color: red, brown Foul Odor After Cleansing: No Slough/Fibrino Yes Wound Bed Granulation Amount: Large (67-100%) Exposed Structure Granulation Quality: Red Fascia Exposed: No Necrotic Amount: None Present (0%) Fat Layer (Subcutaneous Tissue) Exposed: Yes Tendon Exposed: No Muscle Exposed: No Joint Exposed: No Bone Exposed: No Treatment Notes Wound #23 (Right, Lateral Calcaneus) 1. Cleanse With Soap and water 2. Periwound Care TCA Cream 3. Primary Dressing Applied Calcium Alginate Ag 4. Secondary Dressing Dry Gauze 6. Support Layer Holiday representative) Signed: 12/22/2019 4:57:23 PM By: Baruch Gouty RN, BSN Entered By: Baruch Gouty on 12/22/2019  10:45:36 -------------------------------------------------------------------------------- Wound Assessment Details Patient Name: Date of Service: [name] [name] F. 12/22/2019 10:15 A M Medical Record Number: [id number] Patient Account Number: [id number] Date of Birth/Sex: Treating RN: 02-06-1946 (74 y.o. [name] Primary Care Sparrow Sanzo: [name], [name] Other Clinician: Referring Reaghan Kawa: Treating Willim Turnage/Extender: [name], [name] [name], [name] Weeks in Treatment: 192 Wound Status Wound Number: 25 Primary Venous Leg Ulcer Etiology: Wound Location: Right, Lateral Lower Leg Wound Open Wounding Event: Gradually Appeared Status: Date Acquired: 07/06/2019 Comorbid Anemia, Sleep Apnea, Arrhythmia, Congestive Heart Failure, Weeks Of Treatment: 22 History: Coronary Artery Disease, Hypertension, Myocardial Infarction, Clustered Wound: No Type II Diabetes, Gout, Neuropathy Photos Photo Uploaded By: Mikeal Hawthorne on 12/23/2019 11:11:06 Wound Measurements Length: (cm) 2 Width: (cm) 1.5 Depth: (cm) 0.1 Area: (cm) 2.356 Volume: (cm) 0.236 % Reduction in Area: 13.8% % Reduction in Volume: 13.6% Epithelialization: Small (1-33%) Tunneling: No Undermining: No Wound Description Classification: Full Thickness Without Exposed Support Structures Wound Margin: Flat and Intact Exudate Amount: Medium Exudate Type: Serosanguineous Exudate Color: red, brown Foul Odor After Cleansing: No Slough/Fibrino Yes Wound Bed Granulation Amount: Medium (34-66%) Exposed Structure Granulation Quality: Red Fascia Exposed: No Necrotic Amount: Medium (34-66%) Fat Layer (Subcutaneous Tissue) Exposed: Yes Necrotic Quality: Adherent Slough Tendon Exposed: No Muscle Exposed: No Joint Exposed: No Bone Exposed: No Treatment Notes Wound #25 (Right, Lateral Lower Leg) 1. Cleanse With Soap and water 2. Periwound Care TCA Cream 3. Primary Dressing Applied Calcium Alginate Ag 4. Secondary  Dressing Dry Gauze 6. Support Layer Holiday representative) Signed: 12/22/2019 4:57:23 PM By: Baruch Gouty RN, BSN Entered By: Baruch Gouty on 12/22/2019 10:45:57 -------------------------------------------------------------------------------- Wound Assessment Details Patient Name: Date of Service: [name] [name] F. 12/22/2019 10:15 A M Medical Record Number: [id number] Patient Account Number: [id number] Date of Birth/Sex: Treating RN: 12-12-1945 (74 y.o. [name] Primary Care Aireana Ryland: [name], [name] Other Clinician: Referring Azarian Starace: Treating Sincerity Cedar/Extender: [name], [name] [name], [name] Weeks in Treatment: 192 Wound Status Wound Number: 26 Primary Diabetic Wound/Ulcer of the Lower Extremity Etiology: Wound Location: Left, Dorsal Foot Wound Open Wounding Event: Gradually Appeared  [name] Primary Care Verta Riedlinger: [name], [name] Other Clinician: Referring Audray Rumore: Treating Uva Runkel/Extender: [name], [name] [name], [name] Weeks in Treatment: 192 Wound Status Wound Number: 23 Primary Diabetic Wound/Ulcer of the Lower Extremity Etiology: Wound Location: Right, Lateral Calcaneus Secondary Pressure Ulcer Wounding Event: Pressure Injury Etiology: Date Acquired: 04/28/2019 Wound Open Weeks Of Treatment: 34 Status: Clustered Wound: No Comorbid Anemia, Sleep Apnea, Arrhythmia, Congestive Heart Failure, History: Coronary Artery Disease, Hypertension, Myocardial Infarction, Type II Diabetes, Gout, Neuropathy Photos Photo Uploaded By: Mikeal Hawthorne on 12/23/2019 11:11:06 Wound Measurements Length: (cm) 0.3 Width: (cm) 0.7 Depth: (cm) 0.1 Area: (cm) 0.165 Volume: (cm) 0.016 % Reduction in Area: 97.2% % Reduction in Volume: 97.3% Epithelialization: Medium (34-66%) Tunneling: No Undermining: No Wound Description Classification: Grade 2 Wound Margin: Flat and Intact Exudate Amount: Small Exudate Type: Serosanguineous Exudate Color: red, brown Foul Odor After Cleansing: No Slough/Fibrino Yes Wound Bed Granulation Amount: Large (67-100%) Exposed Structure Granulation Quality: Red Fascia Exposed: No Necrotic Amount: None Present (0%) Fat Layer (Subcutaneous Tissue) Exposed: Yes Tendon Exposed: No Muscle Exposed: No Joint Exposed: No Bone Exposed: No Treatment Notes Wound #23 (Right, Lateral Calcaneus) 1. Cleanse With Soap and water 2. Periwound Care TCA Cream 3. Primary Dressing Applied Calcium Alginate Ag 4. Secondary Dressing Dry Gauze 6. Support Layer Holiday representative) Signed: 12/22/2019 4:57:23 PM By: Baruch Gouty RN, BSN Entered By: Baruch Gouty on 12/22/2019  10:45:36 -------------------------------------------------------------------------------- Wound Assessment Details Patient Name: Date of Service: [name] [name] F. 12/22/2019 10:15 A M Medical Record Number: [id number] Patient Account Number: [id number] Date of Birth/Sex: Treating RN: 02-06-1946 (74 y.o. [name] Primary Care Sparrow Sanzo: [name], [name] Other Clinician: Referring Reaghan Kawa: Treating Willim Turnage/Extender: [name], [name] [name], [name] Weeks in Treatment: 192 Wound Status Wound Number: 25 Primary Venous Leg Ulcer Etiology: Wound Location: Right, Lateral Lower Leg Wound Open Wounding Event: Gradually Appeared Status: Date Acquired: 07/06/2019 Comorbid Anemia, Sleep Apnea, Arrhythmia, Congestive Heart Failure, Weeks Of Treatment: 22 History: Coronary Artery Disease, Hypertension, Myocardial Infarction, Clustered Wound: No Type II Diabetes, Gout, Neuropathy Photos Photo Uploaded By: Mikeal Hawthorne on 12/23/2019 11:11:06 Wound Measurements Length: (cm) 2 Width: (cm) 1.5 Depth: (cm) 0.1 Area: (cm) 2.356 Volume: (cm) 0.236 % Reduction in Area: 13.8% % Reduction in Volume: 13.6% Epithelialization: Small (1-33%) Tunneling: No Undermining: No Wound Description Classification: Full Thickness Without Exposed Support Structures Wound Margin: Flat and Intact Exudate Amount: Medium Exudate Type: Serosanguineous Exudate Color: red, brown Foul Odor After Cleansing: No Slough/Fibrino Yes Wound Bed Granulation Amount: Medium (34-66%) Exposed Structure Granulation Quality: Red Fascia Exposed: No Necrotic Amount: Medium (34-66%) Fat Layer (Subcutaneous Tissue) Exposed: Yes Necrotic Quality: Adherent Slough Tendon Exposed: No Muscle Exposed: No Joint Exposed: No Bone Exposed: No Treatment Notes Wound #25 (Right, Lateral Lower Leg) 1. Cleanse With Soap and water 2. Periwound Care TCA Cream 3. Primary Dressing Applied Calcium Alginate Ag 4. Secondary  Dressing Dry Gauze 6. Support Layer Holiday representative) Signed: 12/22/2019 4:57:23 PM By: Baruch Gouty RN, BSN Entered By: Baruch Gouty on 12/22/2019 10:45:57 -------------------------------------------------------------------------------- Wound Assessment Details Patient Name: Date of Service: [name] [name] F. 12/22/2019 10:15 A M Medical Record Number: [id number] Patient Account Number: [id number] Date of Birth/Sex: Treating RN: 12-12-1945 (74 y.o. [name] Primary Care Aireana Ryland: [name], [name] Other Clinician: Referring Azarian Starace: Treating Sincerity Cedar/Extender: [name], [name] [name], [name] Weeks in Treatment: 192 Wound Status Wound Number: 26 Primary Diabetic Wound/Ulcer of the Lower Extremity Etiology: Wound Location: Left, Dorsal Foot Wound Open Wounding Event: Gradually Appeared  Status: Date Acquired: 08/18/2019 Comorbid Anemia, Sleep Apnea, Arrhythmia, Congestive Heart Failure, Weeks Of Treatment: 18 History: Coronary Artery Disease, Hypertension, Myocardial Infarction, Clustered Wound: Yes Type II Diabetes, Gout, Neuropathy Photos Photo Uploaded By: Mikeal Hawthorne on 12/23/2019 11:11:37 Wound Measurements Length: (cm) 6 Width: (cm) 3.7 Depth: (cm) 0.3 Clustered Quantity: 4 Area: (cm) 17.436 Volume: (cm) 5.231 % Reduction in Area: -3070.2% % Reduction in Volume: -9410.9% Epithelialization: Small (1-33%) Tunneling: No Undermining: No Wound Description Classification: Grade 2 Wound Margin: Flat and Intact Exudate Amount: Medium Exudate Type: Serosanguineous Exudate Color: red, brown Foul Odor After Cleansing: No Slough/Fibrino Yes Wound Bed Granulation Amount: Medium (34-66%) Exposed Structure Granulation Quality: Red Fascia Exposed: No Necrotic Amount: Medium (34-66%) Fat Layer (Subcutaneous Tissue) Exposed: Yes Necrotic Quality: Adherent Slough Tendon Exposed: No Muscle Exposed: No Joint Exposed: No Bone  Exposed: No Treatment Notes Wound #26 (Left, Dorsal Foot) 1. Cleanse With Soap and water 2. Periwound Care TCA Cream 3. Primary Dressing Applied Calcium Alginate Ag 4. Secondary Dressing Dry Gauze 6. Support Layer Holiday representative) Signed: 12/22/2019 4:57:23 PM By: Baruch Gouty RN, BSN Entered By: Baruch Gouty on 12/22/2019 10:41:16 -------------------------------------------------------------------------------- [place] Details Patient Name: Date of Service: [name] [name] F. 12/22/2019 10:15 A M Medical Record Number: [id number] Patient Account Number: [id number] Date of Birth/Sex: Treating RN: Nov 13, 1945 (74 y.o. [name] Primary Care Jen Eppinger: [name], [name] Other Clinician: Referring Aneesa Romey: Treating Matti Killingsworth/Extender: [name], [name] [name], [name] Weeks in Treatment: 192 Vital Signs Time Taken: 10:15 Temperature (F): 98.1 Height (in): 74 Pulse (bpm): 77 Source: Stated Respiratory Rate (breaths/min): 18 Weight (lbs): 150 Blood Pressure (mmHg): 109/68 Source: Stated Capillary Blood Glucose (mg/dl): 117 Body Mass Index (BMI): 19.3 Reference Range: 80 - 120 mg / dl Notes glucose per pt report Electronic Signature(s) Signed: 12/22/2019 4:57:23 PM By: Baruch Gouty RN, BSN Entered By: Baruch Gouty on 12/22/2019 10:16:37

## 2019-12-29 DIAGNOSIS — N312 Flaccid neuropathic bladder, not elsewhere classified: Secondary | ICD-10-CM | POA: Diagnosis not present

## 2020-01-04 ENCOUNTER — Other Ambulatory Visit: Payer: Self-pay

## 2020-01-04 ENCOUNTER — Encounter (HOSPITAL_BASED_OUTPATIENT_CLINIC_OR_DEPARTMENT_OTHER): Payer: Medicare HMO | Attending: Internal Medicine | Admitting: Internal Medicine

## 2020-01-04 DIAGNOSIS — L97822 Non-pressure chronic ulcer of other part of left lower leg with fat layer exposed: Secondary | ICD-10-CM | POA: Insufficient documentation

## 2020-01-04 DIAGNOSIS — L97812 Non-pressure chronic ulcer of other part of right lower leg with fat layer exposed: Secondary | ICD-10-CM | POA: Diagnosis not present

## 2020-01-04 DIAGNOSIS — E1122 Type 2 diabetes mellitus with diabetic chronic kidney disease: Secondary | ICD-10-CM | POA: Insufficient documentation

## 2020-01-04 DIAGNOSIS — E11621 Type 2 diabetes mellitus with foot ulcer: Secondary | ICD-10-CM | POA: Insufficient documentation

## 2020-01-04 DIAGNOSIS — I779 Disorder of arteries and arterioles, unspecified: Secondary | ICD-10-CM | POA: Diagnosis not present

## 2020-01-04 DIAGNOSIS — I2510 Atherosclerotic heart disease of native coronary artery without angina pectoris: Secondary | ICD-10-CM | POA: Diagnosis not present

## 2020-01-04 DIAGNOSIS — E1142 Type 2 diabetes mellitus with diabetic polyneuropathy: Secondary | ICD-10-CM | POA: Insufficient documentation

## 2020-01-04 DIAGNOSIS — L89613 Pressure ulcer of right heel, stage 3: Secondary | ICD-10-CM | POA: Insufficient documentation

## 2020-01-04 DIAGNOSIS — I255 Ischemic cardiomyopathy: Secondary | ICD-10-CM | POA: Insufficient documentation

## 2020-01-04 DIAGNOSIS — E11622 Type 2 diabetes mellitus with other skin ulcer: Secondary | ICD-10-CM | POA: Insufficient documentation

## 2020-01-04 DIAGNOSIS — G4730 Sleep apnea, unspecified: Secondary | ICD-10-CM | POA: Insufficient documentation

## 2020-01-04 DIAGNOSIS — I252 Old myocardial infarction: Secondary | ICD-10-CM | POA: Diagnosis not present

## 2020-01-04 DIAGNOSIS — N183 Chronic kidney disease, stage 3 unspecified: Secondary | ICD-10-CM | POA: Insufficient documentation

## 2020-01-04 DIAGNOSIS — I130 Hypertensive heart and chronic kidney disease with heart failure and stage 1 through stage 4 chronic kidney disease, or unspecified chronic kidney disease: Secondary | ICD-10-CM | POA: Insufficient documentation

## 2020-01-04 DIAGNOSIS — L97522 Non-pressure chronic ulcer of other part of left foot with fat layer exposed: Secondary | ICD-10-CM | POA: Diagnosis not present

## 2020-01-04 DIAGNOSIS — I5042 Chronic combined systolic (congestive) and diastolic (congestive) heart failure: Secondary | ICD-10-CM | POA: Diagnosis not present

## 2020-01-06 NOTE — Progress Notes (Signed)
Date of Service: [name] [facility] F. 01/04/2020 9:45 A M Medical Record Number: [id number] Patient Account Number: [id number] Date of Birth/Sex: Treating RN: 10-16-1945 (74 y.o. [name] Primary Care Provider: [name], [name] Other Clinician: Referring Provider: Treating Provider/Extender: Mandie Crabbe [name], [name] Weeks in Treatment: 193 Constitutional Sitting or standing Blood Pressure is within target range for patient.. Pulse regular and within target range for patient.Marland Kitchen Respirations regular, non-labored and within target range.. Temperature is normal and within the target range for the patient.Marland Kitchen Appears in no distress. Notes Wound exam Left lateral leg right along the edge of the tibia. The wound surface does not look too bad the wound is long but narrow. Vigorously debrided with Anasept and gauze no mechanical debridement was necessary there is no surrounding soft tissue infection He has a scattering of areas on the left dorsal foot and ankle and on the right lateral heel and right lateral lower leg Electronic Signature(s) Signed:  01/05/2020 5:12:45 PM By: Linton Ham MD Entered By: Linton Ham on 01/05/2020 07:57:03 -------------------------------------------------------------------------------- Physician Orders Details Patient Name: Date of Service: [name] 01/04/2020 9:45 A M Medical Record Number: [id number] Patient Account Number: [id number] Date of Birth/Sex: Treating RN: Mar 13, 1946 (75 y.o. 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 Dressing Change Frequency Change Dressing every third day - all wounds Skin Barriers/Peri-Wound Care Moisturizing lotion Other: - TCA to left lower extrimity in clinic Wound Cleansing May shower and wash wound with soap and water. - on days that dressing is changed Primary Wound Dressing Wound #14 Left,Lateral Lower Leg Calcium Alginate with Silver Wound #23 Right,Lateral Calcaneus Calcium Alginate with Silver Wound #25 Right,Lateral Lower Leg Calcium Alginate with Silver Wound #26 Left,Dorsal Foot Calcium Alginate with Silver Secondary Dressing Wound #14 Left,Lateral Lower Leg Dry Gauze Wound #23 Right,Lateral Calcaneus Dry Gauze Wound #25 Right,Lateral Lower Leg Dry Gauze Heel Cup Wound #26 Left,Dorsal Foot Dry Gauze Edema Control Kerlix and Coban - Bilateral - use cotton roll not kerlix LIGHTLY Avoid standing for long  periods of time Elevate legs to the level of the heart or above for 30 minutes daily and/or when sitting, a frequency of: - throughout the day Off-Loading Multipodus Splint to: - patient to wear bunny boots to both feet while resting in chair and bed. Electronic Signature(s) Signed: 01/05/2020 5:12:45 PM By: Linton Ham MD Signed: 01/06/2020 5:57:30 PM By: Levan Hurst RN, BSN Entered By: Levan Hurst on 01/04/2020 10:56:15 -------------------------------------------------------------------------------- Problem List Details Patient Name: Date of Service: [name] 01/04/2020 9:45 A M Medical Record Number: [id number] Patient Account Number: [id number] Date of Birth/Sex: Treating RN: 11-15-1945 (74 y.o. 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 01/04/2020 9:45 A M Medical Record Number: [id number] Patient Account Number: [id number] Date of Birth/Sex: Treating RN: 10-16-1945 (74 y.o. [name] Primary Care Provider: [name], [name] Other Clinician: Referring Provider: Treating Provider/Extender: Mandie Crabbe [name], [name] Weeks in Treatment: 193 Constitutional Sitting or standing Blood Pressure is within target range for patient.. Pulse regular and within target range for patient.Marland Kitchen Respirations regular, non-labored and within target range.. Temperature is normal and within the target range for the patient.Marland Kitchen Appears in no distress. Notes Wound exam Left lateral leg right along the edge of the tibia. The wound surface does not look too bad the wound is long but narrow. Vigorously debrided with Anasept and gauze no mechanical debridement was necessary there is no surrounding soft tissue infection He has a scattering of areas on the left dorsal foot and ankle and on the right lateral heel and right lateral lower leg Electronic Signature(s) Signed:  01/05/2020 5:12:45 PM By: Linton Ham MD Entered By: Linton Ham on 01/05/2020 07:57:03 -------------------------------------------------------------------------------- Physician Orders Details Patient Name: Date of Service: [name] 01/04/2020 9:45 A M Medical Record Number: [id number] Patient Account Number: [id number] Date of Birth/Sex: Treating RN: Mar 13, 1946 (75 y.o. 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 Dressing Change Frequency Change Dressing every third day - all wounds Skin Barriers/Peri-Wound Care Moisturizing lotion Other: - TCA to left lower extrimity in clinic Wound Cleansing May shower and wash wound with soap and water. - on days that dressing is changed Primary Wound Dressing Wound #14 Left,Lateral Lower Leg Calcium Alginate with Silver Wound #23 Right,Lateral Calcaneus Calcium Alginate with Silver Wound #25 Right,Lateral Lower Leg Calcium Alginate with Silver Wound #26 Left,Dorsal Foot Calcium Alginate with Silver Secondary Dressing Wound #14 Left,Lateral Lower Leg Dry Gauze Wound #23 Right,Lateral Calcaneus Dry Gauze Wound #25 Right,Lateral Lower Leg Dry Gauze Heel Cup Wound #26 Left,Dorsal Foot Dry Gauze Edema Control Kerlix and Coban - Bilateral - use cotton roll not kerlix LIGHTLY Avoid standing for long  periods of time Elevate legs to the level of the heart or above for 30 minutes daily and/or when sitting, a frequency of: - throughout the day Off-Loading Multipodus Splint to: - patient to wear bunny boots to both feet while resting in chair and bed. Electronic Signature(s) Signed: 01/05/2020 5:12:45 PM By: Linton Ham MD Signed: 01/06/2020 5:57:30 PM By: Levan Hurst RN, BSN Entered By: Levan Hurst on 01/04/2020 10:56:15 -------------------------------------------------------------------------------- Problem List Details Patient Name: Date of Service: [name] 01/04/2020 9:45 A M Medical Record Number: [id number] Patient Account Number: [id number] Date of Birth/Sex: Treating RN: 11-15-1945 (74 y.o. 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 01/04/2020 9:45 A M Medical Record Number: [id number] Patient Account Number: [id number] Date of Birth/Sex: Treating RN: 10-16-1945 (74 y.o. [name] Primary Care Provider: [name], [name] Other Clinician: Referring Provider: Treating Provider/Extender: Mandie Crabbe [name], [name] Weeks in Treatment: 193 Constitutional Sitting or standing Blood Pressure is within target range for patient.. Pulse regular and within target range for patient.Marland Kitchen Respirations regular, non-labored and within target range.. Temperature is normal and within the target range for the patient.Marland Kitchen Appears in no distress. Notes Wound exam Left lateral leg right along the edge of the tibia. The wound surface does not look too bad the wound is long but narrow. Vigorously debrided with Anasept and gauze no mechanical debridement was necessary there is no surrounding soft tissue infection He has a scattering of areas on the left dorsal foot and ankle and on the right lateral heel and right lateral lower leg Electronic Signature(s) Signed:  01/05/2020 5:12:45 PM By: Linton Ham MD Entered By: Linton Ham on 01/05/2020 07:57:03 -------------------------------------------------------------------------------- Physician Orders Details Patient Name: Date of Service: [name] 01/04/2020 9:45 A M Medical Record Number: [id number] Patient Account Number: [id number] Date of Birth/Sex: Treating RN: Mar 13, 1946 (75 y.o. 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 Dressing Change Frequency Change Dressing every third day - all wounds Skin Barriers/Peri-Wound Care Moisturizing lotion Other: - TCA to left lower extrimity in clinic Wound Cleansing May shower and wash wound with soap and water. - on days that dressing is changed Primary Wound Dressing Wound #14 Left,Lateral Lower Leg Calcium Alginate with Silver Wound #23 Right,Lateral Calcaneus Calcium Alginate with Silver Wound #25 Right,Lateral Lower Leg Calcium Alginate with Silver Wound #26 Left,Dorsal Foot Calcium Alginate with Silver Secondary Dressing Wound #14 Left,Lateral Lower Leg Dry Gauze Wound #23 Right,Lateral Calcaneus Dry Gauze Wound #25 Right,Lateral Lower Leg Dry Gauze Heel Cup Wound #26 Left,Dorsal Foot Dry Gauze Edema Control Kerlix and Coban - Bilateral - use cotton roll not kerlix LIGHTLY Avoid standing for long  periods of time Elevate legs to the level of the heart or above for 30 minutes daily and/or when sitting, a frequency of: - throughout the day Off-Loading Multipodus Splint to: - patient to wear bunny boots to both feet while resting in chair and bed. Electronic Signature(s) Signed: 01/05/2020 5:12:45 PM By: Linton Ham MD Signed: 01/06/2020 5:57:30 PM By: Levan Hurst RN, BSN Entered By: Levan Hurst on 01/04/2020 10:56:15 -------------------------------------------------------------------------------- Problem List Details Patient Name: Date of Service: [name] 01/04/2020 9:45 A M Medical Record Number: [id number] Patient Account Number: [id number] Date of Birth/Sex: Treating RN: 11-15-1945 (74 y.o. 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 01/04/2020 9:45 A M Medical Record Number: [id number] Patient Account Number: [id number] Date of Birth/Sex: Treating RN: 10-16-1945 (74 y.o. [name] Primary Care Provider: [name], [name] Other Clinician: Referring Provider: Treating Provider/Extender: Mandie Crabbe [name], [name] Weeks in Treatment: 193 Constitutional Sitting or standing Blood Pressure is within target range for patient.. Pulse regular and within target range for patient.Marland Kitchen Respirations regular, non-labored and within target range.. Temperature is normal and within the target range for the patient.Marland Kitchen Appears in no distress. Notes Wound exam Left lateral leg right along the edge of the tibia. The wound surface does not look too bad the wound is long but narrow. Vigorously debrided with Anasept and gauze no mechanical debridement was necessary there is no surrounding soft tissue infection He has a scattering of areas on the left dorsal foot and ankle and on the right lateral heel and right lateral lower leg Electronic Signature(s) Signed:  01/05/2020 5:12:45 PM By: Linton Ham MD Entered By: Linton Ham on 01/05/2020 07:57:03 -------------------------------------------------------------------------------- Physician Orders Details Patient Name: Date of Service: [name] 01/04/2020 9:45 A M Medical Record Number: [id number] Patient Account Number: [id number] Date of Birth/Sex: Treating RN: Mar 13, 1946 (75 y.o. 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 Dressing Change Frequency Change Dressing every third day - all wounds Skin Barriers/Peri-Wound Care Moisturizing lotion Other: - TCA to left lower extrimity in clinic Wound Cleansing May shower and wash wound with soap and water. - on days that dressing is changed Primary Wound Dressing Wound #14 Left,Lateral Lower Leg Calcium Alginate with Silver Wound #23 Right,Lateral Calcaneus Calcium Alginate with Silver Wound #25 Right,Lateral Lower Leg Calcium Alginate with Silver Wound #26 Left,Dorsal Foot Calcium Alginate with Silver Secondary Dressing Wound #14 Left,Lateral Lower Leg Dry Gauze Wound #23 Right,Lateral Calcaneus Dry Gauze Wound #25 Right,Lateral Lower Leg Dry Gauze Heel Cup Wound #26 Left,Dorsal Foot Dry Gauze Edema Control Kerlix and Coban - Bilateral - use cotton roll not kerlix LIGHTLY Avoid standing for long  periods of time Elevate legs to the level of the heart or above for 30 minutes daily and/or when sitting, a frequency of: - throughout the day Off-Loading Multipodus Splint to: - patient to wear bunny boots to both feet while resting in chair and bed. Electronic Signature(s) Signed: 01/05/2020 5:12:45 PM By: Linton Ham MD Signed: 01/06/2020 5:57:30 PM By: Levan Hurst RN, BSN Entered By: Levan Hurst on 01/04/2020 10:56:15 -------------------------------------------------------------------------------- Problem List Details Patient Name: Date of Service: [name] 01/04/2020 9:45 A M Medical Record Number: [id number] Patient Account Number: [id number] Date of Birth/Sex: Treating RN: 11-15-1945 (74 y.o. 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 01/04/2020 9:45 A M Medical Record Number: [id number] Patient Account Number: [id number] Date of Birth/Sex: Treating RN: 10-16-1945 (74 y.o. [name] Primary Care Provider: [name], [name] Other Clinician: Referring Provider: Treating Provider/Extender: Mandie Crabbe [name], [name] Weeks in Treatment: 193 Constitutional Sitting or standing Blood Pressure is within target range for patient.. Pulse regular and within target range for patient.Marland Kitchen Respirations regular, non-labored and within target range.. Temperature is normal and within the target range for the patient.Marland Kitchen Appears in no distress. Notes Wound exam Left lateral leg right along the edge of the tibia. The wound surface does not look too bad the wound is long but narrow. Vigorously debrided with Anasept and gauze no mechanical debridement was necessary there is no surrounding soft tissue infection He has a scattering of areas on the left dorsal foot and ankle and on the right lateral heel and right lateral lower leg Electronic Signature(s) Signed:  01/05/2020 5:12:45 PM By: Linton Ham MD Entered By: Linton Ham on 01/05/2020 07:57:03 -------------------------------------------------------------------------------- Physician Orders Details Patient Name: Date of Service: [name] 01/04/2020 9:45 A M Medical Record Number: [id number] Patient Account Number: [id number] Date of Birth/Sex: Treating RN: Mar 13, 1946 (75 y.o. 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 Dressing Change Frequency Change Dressing every third day - all wounds Skin Barriers/Peri-Wound Care Moisturizing lotion Other: - TCA to left lower extrimity in clinic Wound Cleansing May shower and wash wound with soap and water. - on days that dressing is changed Primary Wound Dressing Wound #14 Left,Lateral Lower Leg Calcium Alginate with Silver Wound #23 Right,Lateral Calcaneus Calcium Alginate with Silver Wound #25 Right,Lateral Lower Leg Calcium Alginate with Silver Wound #26 Left,Dorsal Foot Calcium Alginate with Silver Secondary Dressing Wound #14 Left,Lateral Lower Leg Dry Gauze Wound #23 Right,Lateral Calcaneus Dry Gauze Wound #25 Right,Lateral Lower Leg Dry Gauze Heel Cup Wound #26 Left,Dorsal Foot Dry Gauze Edema Control Kerlix and Coban - Bilateral - use cotton roll not kerlix LIGHTLY Avoid standing for long  periods of time Elevate legs to the level of the heart or above for 30 minutes daily and/or when sitting, a frequency of: - throughout the day Off-Loading Multipodus Splint to: - patient to wear bunny boots to both feet while resting in chair and bed. Electronic Signature(s) Signed: 01/05/2020 5:12:45 PM By: Linton Ham MD Signed: 01/06/2020 5:57:30 PM By: Levan Hurst RN, BSN Entered By: Levan Hurst on 01/04/2020 10:56:15 -------------------------------------------------------------------------------- Problem List Details Patient Name: Date of Service: [name] 01/04/2020 9:45 A M Medical Record Number: [id number] Patient Account Number: [id number] Date of Birth/Sex: Treating RN: 11-15-1945 (74 y.o. 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 01/04/2020 9:45 A M Medical Record Number: [id number] Patient Account Number: [id number] Date of Birth/Sex: Treating RN: 10-16-1945 (74 y.o. [name] Primary Care Provider: [name], [name] Other Clinician: Referring Provider: Treating Provider/Extender: Mandie Crabbe [name], [name] Weeks in Treatment: 193 Constitutional Sitting or standing Blood Pressure is within target range for patient.. Pulse regular and within target range for patient.Marland Kitchen Respirations regular, non-labored and within target range.. Temperature is normal and within the target range for the patient.Marland Kitchen Appears in no distress. Notes Wound exam Left lateral leg right along the edge of the tibia. The wound surface does not look too bad the wound is long but narrow. Vigorously debrided with Anasept and gauze no mechanical debridement was necessary there is no surrounding soft tissue infection He has a scattering of areas on the left dorsal foot and ankle and on the right lateral heel and right lateral lower leg Electronic Signature(s) Signed:  01/05/2020 5:12:45 PM By: Linton Ham MD Entered By: Linton Ham on 01/05/2020 07:57:03 -------------------------------------------------------------------------------- Physician Orders Details Patient Name: Date of Service: [name] 01/04/2020 9:45 A M Medical Record Number: [id number] Patient Account Number: [id number] Date of Birth/Sex: Treating RN: Mar 13, 1946 (75 y.o. 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 Dressing Change Frequency Change Dressing every third day - all wounds Skin Barriers/Peri-Wound Care Moisturizing lotion Other: - TCA to left lower extrimity in clinic Wound Cleansing May shower and wash wound with soap and water. - on days that dressing is changed Primary Wound Dressing Wound #14 Left,Lateral Lower Leg Calcium Alginate with Silver Wound #23 Right,Lateral Calcaneus Calcium Alginate with Silver Wound #25 Right,Lateral Lower Leg Calcium Alginate with Silver Wound #26 Left,Dorsal Foot Calcium Alginate with Silver Secondary Dressing Wound #14 Left,Lateral Lower Leg Dry Gauze Wound #23 Right,Lateral Calcaneus Dry Gauze Wound #25 Right,Lateral Lower Leg Dry Gauze Heel Cup Wound #26 Left,Dorsal Foot Dry Gauze Edema Control Kerlix and Coban - Bilateral - use cotton roll not kerlix LIGHTLY Avoid standing for long  periods of time Elevate legs to the level of the heart or above for 30 minutes daily and/or when sitting, a frequency of: - throughout the day Off-Loading Multipodus Splint to: - patient to wear bunny boots to both feet while resting in chair and bed. Electronic Signature(s) Signed: 01/05/2020 5:12:45 PM By: Linton Ham MD Signed: 01/06/2020 5:57:30 PM By: Levan Hurst RN, BSN Entered By: Levan Hurst on 01/04/2020 10:56:15 -------------------------------------------------------------------------------- Problem List Details Patient Name: Date of Service: 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 target range for the patient.Marland Kitchen Appears in no distress. Notes Wound exam Left lateral leg right along the edge of the tibia. The wound surface does not look too bad the wound is long but narrow. Vigorously debrided with Anasept and gauze no mechanical debridement was necessary there is no surrounding soft tissue infection He has a scattering of areas on the left dorsal foot and ankle and on the right lateral heel and right lateral lower leg Electronic Signature(s) Signed:  01/05/2020 5:12:45 PM By: Linton Ham MD Entered By: Linton Ham on 01/05/2020 07:57:03 -------------------------------------------------------------------------------- Physician Orders Details Patient Name: Date of Service: [name] 01/04/2020 9:45 A M Medical Record Number: [id number] Patient Account Number: [id number] Date of Birth/Sex: Treating RN: Mar 13, 1946 (75 y.o. 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 Dressing Change Frequency Change Dressing every third day - all wounds Skin Barriers/Peri-Wound Care Moisturizing lotion Other: - TCA to left lower extrimity in clinic Wound Cleansing May shower and wash wound with soap and water. - on days that dressing is changed Primary Wound Dressing Wound #14 Left,Lateral Lower Leg Calcium Alginate with Silver Wound #23 Right,Lateral Calcaneus Calcium Alginate with Silver Wound #25 Right,Lateral Lower Leg Calcium Alginate with Silver Wound #26 Left,Dorsal Foot Calcium Alginate with Silver Secondary Dressing Wound #14 Left,Lateral Lower Leg Dry Gauze Wound #23 Right,Lateral Calcaneus Dry Gauze Wound #25 Right,Lateral Lower Leg Dry Gauze Heel Cup Wound #26 Left,Dorsal Foot Dry Gauze Edema Control Kerlix and Coban - Bilateral - use cotton roll not kerlix LIGHTLY Avoid standing for long  periods of time Elevate legs to the level of the heart or above for 30 minutes daily and/or when sitting, a frequency of: - throughout the day Off-Loading Multipodus Splint to: - patient to wear bunny boots to both feet while resting in chair and bed. Electronic Signature(s) Signed: 01/05/2020 5:12:45 PM By: Linton Ham MD Signed: 01/06/2020 5:57:30 PM By: Levan Hurst RN, BSN Entered By: Levan Hurst on 01/04/2020 10:56:15 -------------------------------------------------------------------------------- Problem List Details Patient Name: Date of Service: [name] 01/04/2020 9:45 A M Medical Record Number: [id number] Patient Account Number: [id number] Date of Birth/Sex: Treating RN: 11-15-1945 (74 y.o. 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 01/04/2020 9:45 A M Medical Record Number: [id number] Patient Account Number: [id number] Date of Birth/Sex: Treating RN: 10-16-1945 (74 y.o. [name] Primary Care Provider: [name], [name] Other Clinician: Referring Provider: Treating Provider/Extender: Mandie Crabbe [name], [name] Weeks in Treatment: 193 Constitutional Sitting or standing Blood Pressure is within target range for patient.. Pulse regular and within target range for patient.Marland Kitchen Respirations regular, non-labored and within target range.. Temperature is normal and within the target range for the patient.Marland Kitchen Appears in no distress. Notes Wound exam Left lateral leg right along the edge of the tibia. The wound surface does not look too bad the wound is long but narrow. Vigorously debrided with Anasept and gauze no mechanical debridement was necessary there is no surrounding soft tissue infection He has a scattering of areas on the left dorsal foot and ankle and on the right lateral heel and right lateral lower leg Electronic Signature(s) Signed:  01/05/2020 5:12:45 PM By: Linton Ham MD Entered By: Linton Ham on 01/05/2020 07:57:03 -------------------------------------------------------------------------------- Physician Orders Details Patient Name: Date of Service: [name] 01/04/2020 9:45 A M Medical Record Number: [id number] Patient Account Number: [id number] Date of Birth/Sex: Treating RN: Mar 13, 1946 (75 y.o. 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 Dressing Change Frequency Change Dressing every third day - all wounds Skin Barriers/Peri-Wound Care Moisturizing lotion Other: - TCA to left lower extrimity in clinic Wound Cleansing May shower and wash wound with soap and water. - on days that dressing is changed Primary Wound Dressing Wound #14 Left,Lateral Lower Leg Calcium Alginate with Silver Wound #23 Right,Lateral Calcaneus Calcium Alginate with Silver Wound #25 Right,Lateral Lower Leg Calcium Alginate with Silver Wound #26 Left,Dorsal Foot Calcium Alginate with Silver Secondary Dressing Wound #14 Left,Lateral Lower Leg Dry Gauze Wound #23 Right,Lateral Calcaneus Dry Gauze Wound #25 Right,Lateral Lower Leg Dry Gauze Heel Cup Wound #26 Left,Dorsal Foot Dry Gauze Edema Control Kerlix and Coban - Bilateral - use cotton roll not kerlix LIGHTLY Avoid standing for long  periods of time Elevate legs to the level of the heart or above for 30 minutes daily and/or when sitting, a frequency of: - throughout the day Off-Loading Multipodus Splint to: - patient to wear bunny boots to both feet while resting in chair and bed. Electronic Signature(s) Signed: 01/05/2020 5:12:45 PM By: Linton Ham MD Signed: 01/06/2020 5:57:30 PM By: Levan Hurst RN, BSN Entered By: Levan Hurst on 01/04/2020 10:56:15 -------------------------------------------------------------------------------- Problem List Details Patient Name: Date of Service: [name] 01/04/2020 9:45 A M Medical Record Number: [id number] Patient Account Number: [id number] Date of Birth/Sex: Treating RN: 11-15-1945 (74 y.o. 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

## 2020-01-06 NOTE — Progress Notes (Signed)
JAYSE, HODKINSON ([id number]) Visit Report for 01/04/2020 Arrival Information Details Patient Name: Date of Service: [name] [name] [facility] F. 01/04/2020 9:45 A M Medical Record Number: [id number] Patient Account Number: [id number] Date of Birth/Sex: Treating RN: 05/30/1945 (74 y.o. Marvis Repress Primary Care Raedyn Klinck: [name], [name] Other Clinician: Referring Love Chowning: Treating Ryen Heitmeyer/Extender: [name], [name] [name], [name] Weeks in Treatment: 193 Visit Information History Since Last Visit Added or deleted any medications: No Patient Arrived: Wheel Chair Any new allergies or adverse reactions: No Arrival Time: 09:57 Had a fall or experienced change in No Accompanied By: daughter activities of daily living that may affect Transfer Assistance: Manual risk of falls: Patient Identification Verified: Yes Signs or symptoms of abuse/neglect since last visito No Secondary Verification Process Completed: Yes Hospitalized since last visit: No Patient Requires Transmission-Based Precautions: No Implantable device outside of the clinic excluding No Patient Has Alerts: Yes cellular tissue based products placed in the center Patient Alerts: Patient on Blood Thinner since last visit: left ABI 1.0; rt ABI 1.09 Has Dressing in Place as Prescribed: Yes Pain Present Now: No Electronic Signature(s) Signed: 01/04/2020 6:04:50 PM By: Kela Millin Entered By: Kela Millin on 01/04/2020 10:03:46 -------------------------------------------------------------------------------- Clinic Level of Care Assessment Details Patient Name: Date of Service: [name] [name] [facility] F. 01/04/2020 9:45 A M Medical Record Number: [id number] Patient Account Number: [id number] Date of Birth/Sex: Treating RN: 04/08/1946 (74 y.o. [name] Primary Care Sharaya Boruff: [name], [name] Other Clinician: Referring Kerianna Rawlinson: Treating Dannah Ryles/Extender: [name], [name] [name], [name] Weeks in Treatment: 193 Clinic  Level of Care Assessment Items TOOL 4 Quantity Score X- 1 0 Use when only an EandM is performed on FOLLOW-UP visit ASSESSMENTS - Nursing Assessment / Reassessment X- 1 10 Reassessment of Co-morbidities (includes updates in patient status) X- 1 5 Reassessment of Adherence to Treatment Plan ASSESSMENTS - Wound and Skin A ssessment / Reassessment []  - 0 Simple Wound Assessment / Reassessment - one wound X- 4 5 Complex Wound Assessment / Reassessment - multiple wounds []  - 0 Dermatologic / Skin Assessment (not related to wound area) ASSESSMENTS - Focused Assessment []  - 0 Circumferential Edema Measurements - multi extremities []  - 0 Nutritional Assessment / Counseling / Intervention X- 1 5 Lower Extremity Assessment (monofilament, tuning fork, pulses) []  - 0 Peripheral Arterial Disease Assessment (using hand held doppler) ASSESSMENTS - Ostomy and/or Continence Assessment and Care []  - 0 Incontinence Assessment and Management []  - 0 Ostomy Care Assessment and Management (repouching, etc.) PROCESS - Coordination of Care X - Simple Patient / Family Education for ongoing care 1 15 []  - 0 Complex (extensive) Patient / Family Education for ongoing care X- 1 10 Staff obtains Programmer, systems, Records, T Results / Process Orders est []  - 0 Staff telephones HHA, Nursing Homes / Clarify orders / etc []  - 0 Routine Transfer to another Facility (non-emergent condition) []  - 0 Routine Hospital Admission (non-emergent condition) []  - 0 New Admissions / Biomedical engineer / Ordering NPWT Apligraf, etc. , []  - 0 Emergency Hospital Admission (emergent condition) X- 1 10 Simple Discharge Coordination []  - 0 Complex (extensive) Discharge Coordination PROCESS - Special Needs []  - 0 Pediatric / Minor Patient Management []  - 0 Isolation Patient Management []  - 0 Hearing / Language / Visual special needs []  - 0 Assessment of Community assistance (transportation, D/C planning, etc.) []   - 0 Additional assistance / Altered mentation []  - 0 Support Surface(s) Assessment (bed, cushion, seat, etc.) INTERVENTIONS - Wound Cleansing / Measurement []  - 0 Simple Wound  Notes kerlix and coban applied lightly. Electronic Signature(s) Signed: 01/05/2020 5:12:45 PM By: Linton Ham MD Signed: 01/06/2020 5:57:30 PM By: Levan Hurst RN, BSN Entered By: Linton Ham on 01/05/2020 07:51:05 -------------------------------------------------------------------------------- Multi-Disciplinary Care Plan Details Patient Name: Date of Service: [name] Foley F. 01/04/2020 9:45 A M Medical Record Number: [id number] Patient Account Number: [id number] Date of Birth/Sex: Treating RN: 1946-01-01 (74 y.o. [name] Primary Care Greely Atiyeh: [name], [name] Other Clinician: Referring Vivan Vanderveer: Treating Mahalie Kanner/Extender: [name], [name] [name], [name] Weeks in Treatment: 193 Active Inactive Wound/Skin Impairment Nursing  Diagnoses: Impaired tissue integrity Goals: Patient/caregiver will verbalize understanding of skin care regimen Date Initiated: 11/19/2017 Target Resolution Date: 02/05/2020 Goal Status: Active Ulcer/skin breakdown will have a volume reduction of 30% by week 4 Date Initiated: 11/19/2017 Date Inactivated: 12/02/2017 Target Resolution Date: 12/22/2017 Goal Status: Unmet Unmet Reason: multipl comorbidities Interventions: Assess patient/caregiver ability to obtain necessary supplies Assess ulceration(s) every visit Provide education on ulcer and skin care Notes: Electronic Signature(s) Signed: 01/06/2020 5:57:30 PM By: Levan Hurst RN, BSN Entered By: Levan Hurst on 01/04/2020 09:57:31 -------------------------------------------------------------------------------- Pain Assessment Details Patient Name: Date of Service: [name] Foley F. 01/04/2020 9:45 A M Medical Record Number: [id number] Patient Account Number: [id number] Date of Birth/Sex: Treating RN: [birth year] (74 y.o. Marvis Repress Primary Care Orlandis Sanden: [name], [name] Other Clinician: Referring Darbi Chandran: Treating Davian Hanshaw/Extender: [name], [name] [name], [name] Weeks in Treatment: 193 Active Problems Location of Pain Severity and Description of Pain Patient Has Paino No Site Locations Pain Management and Medication Current Pain Management: Electronic Signature(s) Signed: 01/04/2020 6:04:50 PM By: Kela Millin Entered By: Kela Millin on 01/04/2020 10:20:50 -------------------------------------------------------------------------------- Patient/Caregiver Education Details Patient Name: Date of Service: [name] [name] 9/13/2021andnbsp9:45 A M Medical Record Number: [id number] Patient Account Number: [id number] Date of Birth/Gender: Treating RN: 02-Dec-1945 (74 y.o. [name] Primary Care Physician: [name], [name] Other Clinician: Referring Physician: Treating Physician/Extender:  [name], [name] [name], [name] Weeks in Treatment: 193 Education Assessment Education Provided To: Patient Education Topics Provided Wound/Skin Impairment: Methods: Explain/Verbal Responses: State content correctly Electronic Signature(s) Signed: 01/06/2020 5:57:30 PM By: Levan Hurst RN, BSN Entered By: Levan Hurst on 01/04/2020 09:57:48 -------------------------------------------------------------------------------- Wound Assessment Details Patient Name: Date of Service: [name] Foley F. 01/04/2020 9:45 A M Medical Record Number: [id number] Patient Account Number: [id number] Date of Birth/Sex: Treating RN: March 02, 1946 (74 y.o. Marvis Repress Primary Care Zyrah Wiswell: [name], [name] Other Clinician: Referring Vernice Mannina: Treating Edker Punt/Extender: [name], [name] [name], [name] Weeks in Treatment: 193 Wound Status Wound Number: 14 Primary Diabetic Wound/Ulcer of the Lower Extremity Etiology: Wound Location: Left, Lateral Lower Leg Wound Open Wounding Event: Gradually Appeared Status: Date Acquired: 08/15/2017 Comorbid Anemia, Sleep Apnea, Arrhythmia, Congestive Heart Failure, Weeks Of Treatment: 124 History: Coronary Artery Disease, Hypertension, Myocardial Infarction, Clustered Wound: Yes Type II Diabetes, Gout, Neuropathy Photos Photo Uploaded By: Mikeal Hawthorne on 01/06/2020 11:37:43 Wound Measurements Length: (cm) 10.5 Width: (cm) 1.7 Depth: (cm) 0.4 Clustered Quantity: 2 Area: (cm) 14.019 Volume: (cm) 5.608 % Reduction in Area: -1388.2% % Reduction in Volume: -5866% Epithelialization: Small (1-33%) Tunneling: No Undermining: No Wound Description Classification: Grade 2 Wound Margin: Well defined, not attached Exudate Amount: Medium Exudate Type: Purulent Exudate Color: yellow, brown, green Foul Odor After Cleansing: No Slough/Fibrino Yes Wound Bed Granulation Amount: Medium (34-66%) Exposed Structure Granulation Quality: Red, Pink,  Friable Fascia Exposed: No Necrotic Amount: Medium (34-66%) Fat Layer (Subcutaneous Tissue) Exposed: Yes Necrotic Quality: Adherent Slough Tendon Exposed: No Muscle Exposed: No Joint Exposed: No Bone Exposed:  Notes kerlix and coban applied lightly. Electronic Signature(s) Signed: 01/05/2020 5:12:45 PM By: Linton Ham MD Signed: 01/06/2020 5:57:30 PM By: Levan Hurst RN, BSN Entered By: Linton Ham on 01/05/2020 07:51:05 -------------------------------------------------------------------------------- Multi-Disciplinary Care Plan Details Patient Name: Date of Service: [name] Foley F. 01/04/2020 9:45 A M Medical Record Number: [id number] Patient Account Number: [id number] Date of Birth/Sex: Treating RN: 1946-01-01 (74 y.o. [name] Primary Care Greely Atiyeh: [name], [name] Other Clinician: Referring Vivan Vanderveer: Treating Mahalie Kanner/Extender: [name], [name] [name], [name] Weeks in Treatment: 193 Active Inactive Wound/Skin Impairment Nursing  Diagnoses: Impaired tissue integrity Goals: Patient/caregiver will verbalize understanding of skin care regimen Date Initiated: 11/19/2017 Target Resolution Date: 02/05/2020 Goal Status: Active Ulcer/skin breakdown will have a volume reduction of 30% by week 4 Date Initiated: 11/19/2017 Date Inactivated: 12/02/2017 Target Resolution Date: 12/22/2017 Goal Status: Unmet Unmet Reason: multipl comorbidities Interventions: Assess patient/caregiver ability to obtain necessary supplies Assess ulceration(s) every visit Provide education on ulcer and skin care Notes: Electronic Signature(s) Signed: 01/06/2020 5:57:30 PM By: Levan Hurst RN, BSN Entered By: Levan Hurst on 01/04/2020 09:57:31 -------------------------------------------------------------------------------- Pain Assessment Details Patient Name: Date of Service: [name] Foley F. 01/04/2020 9:45 A M Medical Record Number: [id number] Patient Account Number: [id number] Date of Birth/Sex: Treating RN: [birth year] (74 y.o. Marvis Repress Primary Care Orlandis Sanden: [name], [name] Other Clinician: Referring Darbi Chandran: Treating Davian Hanshaw/Extender: [name], [name] [name], [name] Weeks in Treatment: 193 Active Problems Location of Pain Severity and Description of Pain Patient Has Paino No Site Locations Pain Management and Medication Current Pain Management: Electronic Signature(s) Signed: 01/04/2020 6:04:50 PM By: Kela Millin Entered By: Kela Millin on 01/04/2020 10:20:50 -------------------------------------------------------------------------------- Patient/Caregiver Education Details Patient Name: Date of Service: [name] [name] 9/13/2021andnbsp9:45 A M Medical Record Number: [id number] Patient Account Number: [id number] Date of Birth/Gender: Treating RN: 02-Dec-1945 (74 y.o. [name] Primary Care Physician: [name], [name] Other Clinician: Referring Physician: Treating Physician/Extender:  [name], [name] [name], [name] Weeks in Treatment: 193 Education Assessment Education Provided To: Patient Education Topics Provided Wound/Skin Impairment: Methods: Explain/Verbal Responses: State content correctly Electronic Signature(s) Signed: 01/06/2020 5:57:30 PM By: Levan Hurst RN, BSN Entered By: Levan Hurst on 01/04/2020 09:57:48 -------------------------------------------------------------------------------- Wound Assessment Details Patient Name: Date of Service: [name] Foley F. 01/04/2020 9:45 A M Medical Record Number: [id number] Patient Account Number: [id number] Date of Birth/Sex: Treating RN: March 02, 1946 (74 y.o. Marvis Repress Primary Care Zyrah Wiswell: [name], [name] Other Clinician: Referring Vernice Mannina: Treating Edker Punt/Extender: [name], [name] [name], [name] Weeks in Treatment: 193 Wound Status Wound Number: 14 Primary Diabetic Wound/Ulcer of the Lower Extremity Etiology: Wound Location: Left, Lateral Lower Leg Wound Open Wounding Event: Gradually Appeared Status: Date Acquired: 08/15/2017 Comorbid Anemia, Sleep Apnea, Arrhythmia, Congestive Heart Failure, Weeks Of Treatment: 124 History: Coronary Artery Disease, Hypertension, Myocardial Infarction, Clustered Wound: Yes Type II Diabetes, Gout, Neuropathy Photos Photo Uploaded By: Mikeal Hawthorne on 01/06/2020 11:37:43 Wound Measurements Length: (cm) 10.5 Width: (cm) 1.7 Depth: (cm) 0.4 Clustered Quantity: 2 Area: (cm) 14.019 Volume: (cm) 5.608 % Reduction in Area: -1388.2% % Reduction in Volume: -5866% Epithelialization: Small (1-33%) Tunneling: No Undermining: No Wound Description Classification: Grade 2 Wound Margin: Well defined, not attached Exudate Amount: Medium Exudate Type: Purulent Exudate Color: yellow, brown, green Foul Odor After Cleansing: No Slough/Fibrino Yes Wound Bed Granulation Amount: Medium (34-66%) Exposed Structure Granulation Quality: Red, Pink,  Friable Fascia Exposed: No Necrotic Amount: Medium (34-66%) Fat Layer (Subcutaneous Tissue) Exposed: Yes Necrotic Quality: Adherent Slough Tendon Exposed: No Muscle Exposed: No Joint Exposed: No Bone Exposed:  Ag 4. Secondary Dressing Dry Gauze 6. Support Layer Applied Kerlix/Coban Notes kerlix and coban applied lightly. Electronic Signature(s) Signed: 01/04/2020 6:04:50 PM By: Kela Millin Entered By: Kela Millin on 01/04/2020 10:23:01 -------------------------------------------------------------------------------- Wound Assessment Details Patient Name: Date of Service: [name] [name] [facility] F. 01/04/2020 9:45 A M Medical Record Number: [id number] Patient Account Number: [id number] Date of Birth/Sex: Treating RN: 07-14-1945 (74 y.o. Marvis Repress Primary Care Maryruth Apple: [name], [name] Other Clinician: Referring Kyara Boxer: Treating Jahking Lesser/Extender: [name], [name] [name], [name] Weeks in Treatment: 193 Wound Status Wound Number: 26 Primary Diabetic Wound/Ulcer of the Lower Extremity Etiology: Wound Location: Left, Dorsal Foot Wound Open Wounding Event: Gradually Appeared Status: Date Acquired: 08/18/2019 Comorbid Anemia, Sleep Apnea, Arrhythmia, Congestive Heart  Failure, Weeks Of Treatment: 19 History: Coronary Artery Disease, Hypertension, Myocardial Infarction, Clustered Wound: Yes Type II Diabetes, Gout, Neuropathy Photos Photo Uploaded By: Mikeal Hawthorne on 01/06/2020 11:37:44 Wound Measurements Length: (cm) 3.2 Width: (cm) 1 Depth: (cm) 0.2 Clustered Quantity: 4 Area: (cm) 2. Volume: (cm) 0. % Reduction in Area: -356.9% % Reduction in Volume: -814.5% Epithelialization: Small (1-33%) Tunneling: No 513 Undermining: No 503 Wound Description Classification: Grade 2 Wound Margin: Flat and Intact Exudate Amount: Medium Exudate Type: Serosanguineous Exudate Color: red, brown Foul Odor After Cleansing: No Slough/Fibrino Yes Wound Bed Granulation Amount: Medium (34-66%) Exposed Structure Granulation Quality: Red Fascia Exposed: No Necrotic Amount: Medium (34-66%) Fat Layer (Subcutaneous Tissue) Exposed: Yes Necrotic Quality: Adherent Slough Tendon Exposed: No Muscle Exposed: No Joint Exposed: No Bone Exposed: No Treatment Notes Wound #26 (Left, Dorsal Foot) 1. Cleanse With Wound Cleanser Soap and water 2. Periwound Care TCA Cream 3. Primary Dressing Applied Calcium Alginate Ag 4. Secondary Dressing Dry Gauze 6. Support Layer Applied Kerlix/Coban Notes kerlix and coban applied lightly. Electronic Signature(s) Signed: 01/04/2020 6:04:50 PM By: Kela Millin Signed: 01/04/2020 6:04:50 PM By: Kela Millin Entered By: Kela Millin on 01/04/2020 10:23:33 -------------------------------------------------------------------------------- Vitals Details Patient Name: Date of Service: [name] Foley F. 01/04/2020 9:45 A M Medical Record Number: [id number] Patient Account Number: [id number] Date of Birth/Sex: Treating RN: 10/27/1945 (74 y.o. Marvis Repress Primary Care Ingris Pasquarella: [name], [name] Other Clinician: Referring Shaylynn Nulty: Treating Teriann Livingood/Extender: [name], [name] [name], [name] Weeks in  Treatment: 193 Vital Signs Time Taken: 10:00 Temperature (F): 98.6 Height (in): 74 Pulse (bpm): 62 Weight (lbs): 150 Respiratory Rate (breaths/min): 18 Body Mass Index (BMI): 19.3 Blood Pressure (mmHg): 118/68 Capillary Blood Glucose (mg/dl): 118 Reference Range: 80 - 120 mg / dl Notes patient stated CBG was 118 this morning Electronic Signature(s) Signed: 01/04/2020 6:04:50 PM By: Kela Millin Entered By: Kela Millin on 01/04/2020 10:20:42  Ag 4. Secondary Dressing Dry Gauze 6. Support Layer Applied Kerlix/Coban Notes kerlix and coban applied lightly. Electronic Signature(s) Signed: 01/04/2020 6:04:50 PM By: Kela Millin Entered By: Kela Millin on 01/04/2020 10:23:01 -------------------------------------------------------------------------------- Wound Assessment Details Patient Name: Date of Service: [name] [name] [facility] F. 01/04/2020 9:45 A M Medical Record Number: [id number] Patient Account Number: [id number] Date of Birth/Sex: Treating RN: 07-14-1945 (74 y.o. Marvis Repress Primary Care Maryruth Apple: [name], [name] Other Clinician: Referring Kyara Boxer: Treating Jahking Lesser/Extender: [name], [name] [name], [name] Weeks in Treatment: 193 Wound Status Wound Number: 26 Primary Diabetic Wound/Ulcer of the Lower Extremity Etiology: Wound Location: Left, Dorsal Foot Wound Open Wounding Event: Gradually Appeared Status: Date Acquired: 08/18/2019 Comorbid Anemia, Sleep Apnea, Arrhythmia, Congestive Heart  Failure, Weeks Of Treatment: 19 History: Coronary Artery Disease, Hypertension, Myocardial Infarction, Clustered Wound: Yes Type II Diabetes, Gout, Neuropathy Photos Photo Uploaded By: Mikeal Hawthorne on 01/06/2020 11:37:44 Wound Measurements Length: (cm) 3.2 Width: (cm) 1 Depth: (cm) 0.2 Clustered Quantity: 4 Area: (cm) 2. Volume: (cm) 0. % Reduction in Area: -356.9% % Reduction in Volume: -814.5% Epithelialization: Small (1-33%) Tunneling: No 513 Undermining: No 503 Wound Description Classification: Grade 2 Wound Margin: Flat and Intact Exudate Amount: Medium Exudate Type: Serosanguineous Exudate Color: red, brown Foul Odor After Cleansing: No Slough/Fibrino Yes Wound Bed Granulation Amount: Medium (34-66%) Exposed Structure Granulation Quality: Red Fascia Exposed: No Necrotic Amount: Medium (34-66%) Fat Layer (Subcutaneous Tissue) Exposed: Yes Necrotic Quality: Adherent Slough Tendon Exposed: No Muscle Exposed: No Joint Exposed: No Bone Exposed: No Treatment Notes Wound #26 (Left, Dorsal Foot) 1. Cleanse With Wound Cleanser Soap and water 2. Periwound Care TCA Cream 3. Primary Dressing Applied Calcium Alginate Ag 4. Secondary Dressing Dry Gauze 6. Support Layer Applied Kerlix/Coban Notes kerlix and coban applied lightly. Electronic Signature(s) Signed: 01/04/2020 6:04:50 PM By: Kela Millin Signed: 01/04/2020 6:04:50 PM By: Kela Millin Entered By: Kela Millin on 01/04/2020 10:23:33 -------------------------------------------------------------------------------- Vitals Details Patient Name: Date of Service: [name] Foley F. 01/04/2020 9:45 A M Medical Record Number: [id number] Patient Account Number: [id number] Date of Birth/Sex: Treating RN: 10/27/1945 (74 y.o. Marvis Repress Primary Care Ingris Pasquarella: [name], [name] Other Clinician: Referring Shaylynn Nulty: Treating Teriann Livingood/Extender: [name], [name] [name], [name] Weeks in  Treatment: 193 Vital Signs Time Taken: 10:00 Temperature (F): 98.6 Height (in): 74 Pulse (bpm): 62 Weight (lbs): 150 Respiratory Rate (breaths/min): 18 Body Mass Index (BMI): 19.3 Blood Pressure (mmHg): 118/68 Capillary Blood Glucose (mg/dl): 118 Reference Range: 80 - 120 mg / dl Notes patient stated CBG was 118 this morning Electronic Signature(s) Signed: 01/04/2020 6:04:50 PM By: Kela Millin Entered By: Kela Millin on 01/04/2020 10:20:42  Notes kerlix and coban applied lightly. Electronic Signature(s) Signed: 01/05/2020 5:12:45 PM By: Linton Ham MD Signed: 01/06/2020 5:57:30 PM By: Levan Hurst RN, BSN Entered By: Linton Ham on 01/05/2020 07:51:05 -------------------------------------------------------------------------------- Multi-Disciplinary Care Plan Details Patient Name: Date of Service: [name] Foley F. 01/04/2020 9:45 A M Medical Record Number: [id number] Patient Account Number: [id number] Date of Birth/Sex: Treating RN: 1946-01-01 (74 y.o. [name] Primary Care Greely Atiyeh: [name], [name] Other Clinician: Referring Vivan Vanderveer: Treating Mahalie Kanner/Extender: [name], [name] [name], [name] Weeks in Treatment: 193 Active Inactive Wound/Skin Impairment Nursing  Diagnoses: Impaired tissue integrity Goals: Patient/caregiver will verbalize understanding of skin care regimen Date Initiated: 11/19/2017 Target Resolution Date: 02/05/2020 Goal Status: Active Ulcer/skin breakdown will have a volume reduction of 30% by week 4 Date Initiated: 11/19/2017 Date Inactivated: 12/02/2017 Target Resolution Date: 12/22/2017 Goal Status: Unmet Unmet Reason: multipl comorbidities Interventions: Assess patient/caregiver ability to obtain necessary supplies Assess ulceration(s) every visit Provide education on ulcer and skin care Notes: Electronic Signature(s) Signed: 01/06/2020 5:57:30 PM By: Levan Hurst RN, BSN Entered By: Levan Hurst on 01/04/2020 09:57:31 -------------------------------------------------------------------------------- Pain Assessment Details Patient Name: Date of Service: [name] Foley F. 01/04/2020 9:45 A M Medical Record Number: [id number] Patient Account Number: [id number] Date of Birth/Sex: Treating RN: [birth year] (74 y.o. Marvis Repress Primary Care Orlandis Sanden: [name], [name] Other Clinician: Referring Darbi Chandran: Treating Davian Hanshaw/Extender: [name], [name] [name], [name] Weeks in Treatment: 193 Active Problems Location of Pain Severity and Description of Pain Patient Has Paino No Site Locations Pain Management and Medication Current Pain Management: Electronic Signature(s) Signed: 01/04/2020 6:04:50 PM By: Kela Millin Entered By: Kela Millin on 01/04/2020 10:20:50 -------------------------------------------------------------------------------- Patient/Caregiver Education Details Patient Name: Date of Service: [name] [name] 9/13/2021andnbsp9:45 A M Medical Record Number: [id number] Patient Account Number: [id number] Date of Birth/Gender: Treating RN: 02-Dec-1945 (74 y.o. [name] Primary Care Physician: [name], [name] Other Clinician: Referring Physician: Treating Physician/Extender:  [name], [name] [name], [name] Weeks in Treatment: 193 Education Assessment Education Provided To: Patient Education Topics Provided Wound/Skin Impairment: Methods: Explain/Verbal Responses: State content correctly Electronic Signature(s) Signed: 01/06/2020 5:57:30 PM By: Levan Hurst RN, BSN Entered By: Levan Hurst on 01/04/2020 09:57:48 -------------------------------------------------------------------------------- Wound Assessment Details Patient Name: Date of Service: [name] Foley F. 01/04/2020 9:45 A M Medical Record Number: [id number] Patient Account Number: [id number] Date of Birth/Sex: Treating RN: March 02, 1946 (74 y.o. Marvis Repress Primary Care Zyrah Wiswell: [name], [name] Other Clinician: Referring Vernice Mannina: Treating Edker Punt/Extender: [name], [name] [name], [name] Weeks in Treatment: 193 Wound Status Wound Number: 14 Primary Diabetic Wound/Ulcer of the Lower Extremity Etiology: Wound Location: Left, Lateral Lower Leg Wound Open Wounding Event: Gradually Appeared Status: Date Acquired: 08/15/2017 Comorbid Anemia, Sleep Apnea, Arrhythmia, Congestive Heart Failure, Weeks Of Treatment: 124 History: Coronary Artery Disease, Hypertension, Myocardial Infarction, Clustered Wound: Yes Type II Diabetes, Gout, Neuropathy Photos Photo Uploaded By: Mikeal Hawthorne on 01/06/2020 11:37:43 Wound Measurements Length: (cm) 10.5 Width: (cm) 1.7 Depth: (cm) 0.4 Clustered Quantity: 2 Area: (cm) 14.019 Volume: (cm) 5.608 % Reduction in Area: -1388.2% % Reduction in Volume: -5866% Epithelialization: Small (1-33%) Tunneling: No Undermining: No Wound Description Classification: Grade 2 Wound Margin: Well defined, not attached Exudate Amount: Medium Exudate Type: Purulent Exudate Color: yellow, brown, green Foul Odor After Cleansing: No Slough/Fibrino Yes Wound Bed Granulation Amount: Medium (34-66%) Exposed Structure Granulation Quality: Red, Pink,  Friable Fascia Exposed: No Necrotic Amount: Medium (34-66%) Fat Layer (Subcutaneous Tissue) Exposed: Yes Necrotic Quality: Adherent Slough Tendon Exposed: No Muscle Exposed: No Joint Exposed: No Bone Exposed:

## 2020-01-19 ENCOUNTER — Encounter (HOSPITAL_BASED_OUTPATIENT_CLINIC_OR_DEPARTMENT_OTHER): Payer: Medicare HMO | Admitting: Internal Medicine

## 2020-01-19 ENCOUNTER — Other Ambulatory Visit: Payer: Self-pay

## 2020-01-19 DIAGNOSIS — L97522 Non-pressure chronic ulcer of other part of left foot with fat layer exposed: Secondary | ICD-10-CM | POA: Diagnosis not present

## 2020-01-19 DIAGNOSIS — L97812 Non-pressure chronic ulcer of other part of right lower leg with fat layer exposed: Secondary | ICD-10-CM | POA: Diagnosis not present

## 2020-01-19 DIAGNOSIS — E11621 Type 2 diabetes mellitus with foot ulcer: Secondary | ICD-10-CM | POA: Diagnosis not present

## 2020-01-19 DIAGNOSIS — E11622 Type 2 diabetes mellitus with other skin ulcer: Secondary | ICD-10-CM | POA: Diagnosis not present

## 2020-01-19 DIAGNOSIS — L89613 Pressure ulcer of right heel, stage 3: Secondary | ICD-10-CM | POA: Diagnosis not present

## 2020-01-19 DIAGNOSIS — L97822 Non-pressure chronic ulcer of other part of left lower leg with fat layer exposed: Secondary | ICD-10-CM | POA: Diagnosis not present

## 2020-01-19 DIAGNOSIS — E1122 Type 2 diabetes mellitus with diabetic chronic kidney disease: Secondary | ICD-10-CM | POA: Diagnosis not present

## 2020-01-19 DIAGNOSIS — I252 Old myocardial infarction: Secondary | ICD-10-CM | POA: Diagnosis not present

## 2020-01-19 DIAGNOSIS — I130 Hypertensive heart and chronic kidney disease with heart failure and stage 1 through stage 4 chronic kidney disease, or unspecified chronic kidney disease: Secondary | ICD-10-CM | POA: Diagnosis not present

## 2020-01-19 NOTE — Progress Notes (Signed)
No Muscle: No Joint: No Bone: No Small (1-33%)] Epithelialization:  [14:Compression Therapy] [23:Compression Therapy] [25:Compression [place] Wound Number: 26 N/A N/A Photos: No Photos N/A N/A Left, Dorsal Foot N/A N/A Wound Location: Gradually Appeared N/A N/A Wounding Event: Diabetic Wound/Ulcer of the Lower N/A N/A Primary Etiology: Extremity N/A N/A N/A Secondary Etiology: Anemia, Sleep Apnea, Arrhythmia, N/A N/A Comorbid History: Congestive Heart Failure, Coronary Artery Disease, Hypertension, Myocardial Infarction, Type II Diabetes, Gout, Neuropathy 08/18/2019 N/A N/A Date Acquired: 22 N/A N/A Weeks of Treatment: Open N/A N/A Wound Status: Yes N/A N/A Clustered Wound: 4 N/A N/A Clustered Quantity: 0.7x0.6x0.1 N/A N/A Measurements L x W x D (cm) 0.33 N/A N/A A (cm) : rea 0.033 N/A N/A Volume (cm) : 40.00% N/A N/A % Reduction in A rea: 40.00% N/A N/A % Reduction in Volume: Grade 2 N/A N/A Classification: Small N/A N/A Exudate A mount: Serosanguineous N/A N/A Exudate Type: red, brown N/A N/A Exudate Color: Flat and Intact N/A N/A Wound Margin: Large (67-100%) N/A N/A Granulation A mount: Red N/A N/A Granulation Quality: Small (1-33%) N/A N/A Necrotic A mount: Fat Layer (Subcutaneous Tissue): Yes N/A N/A Exposed Structures: Fascia: No Tendon: No Muscle: No Joint: No Bone: No Small (1-33%) N/A N/A Epithelialization: Compression Therapy N/A N/A Procedures Performed: Treatment Notes Electronic Signature(s) Signed: 01/19/2020 5:19:28 PM By: Linton Ham MD Signed: 01/19/2020 5:34:55 PM By: [name] RN Entered By: Linton Ham on 01/19/2020 10:24:02 -------------------------------------------------------------------------------- Multi-Disciplinary Care Plan Details Patient Name: Date of Service: [name] [name] F. 01/19/2020 9:45 A M Medical Record Number: [id number] Patient Account Number: [id number] Date of Birth/Sex: Treating RN: [birth year] (74 y.o. [name] Primary Care Treyshon Buchanon: [name],  [name] Other Clinician: Referring Petronella Shuford: Treating Cola Gane/Extender: [name], [name] [name], [name] Weeks in Treatment: 196 Active Inactive Wound/Skin Impairment Nursing Diagnoses: Impaired tissue integrity Goals: Patient/caregiver will verbalize understanding of skin care regimen Date Initiated: 11/19/2017 Target Resolution Date: 02/05/2020 Goal Status: Active Ulcer/skin breakdown will have a volume reduction of 30% by week 4 Date Initiated: 11/19/2017 Date Inactivated: 12/02/2017 Target Resolution Date: 12/22/2017 Goal Status: Unmet Unmet Reason: multipl comorbidities Interventions: Assess patient/caregiver ability to obtain necessary supplies Assess ulceration(s) every visit Provide education on ulcer and skin care Notes: Electronic Signature(s) Signed: 01/19/2020 5:34:55 PM By: [name] RN Entered By: [name] on 01/19/2020 09:34:59 -------------------------------------------------------------------------------- Pain Assessment Details Patient Name: Date of Service: [name] [name] [facility] F. 01/19/2020 9:45 A M Medical Record Number: [id number] Patient Account Number: [id number] Date of Birth/Sex: Treating RN: Nov 25, 1945 (74 y.o. [name] Primary Care Nickson Middlesworth: [name], [name] Other Clinician: Referring Teion Ballin: Treating Avya Flavell/Extender: [name], [name] [name], [name] Weeks in Treatment: 196 Active Problems Location of Pain Severity and Description of Pain Patient Has Paino No Site Locations Rate the pain. Current Pain Level: 0 Pain Management and Medication Current Pain Management: Electronic Signature(s) Signed: 01/19/2020 5:46:00 PM By: Baruch Gouty RN, BSN Entered By: Baruch Gouty on 01/19/2020 10:08:45 -------------------------------------------------------------------------------- Patient/Caregiver Education Details Patient Name: Date of Service: [name] [name] 9/28/2021andnbsp9:45 A M Medical Record Number: [id number] Patient  Account Number: [id number] Date of Birth/Gender: Treating RN: 01-09-1946 (74 y.o. [name] Primary Care Physician: [name], [name] Other Clinician: Referring Physician: Treating Physician/Extender: [name], [name] [name], [name] Weeks in Treatment: 196 Education Assessment Education Provided To: Patient Education Topics Provided Wound/Skin Impairment: Methods: Explain/Verbal Responses: State content correctly Electronic Signature(s) Signed: 01/19/2020 5:34:55 PM By: [name] RN Entered By: [name] on 01/19/2020 09:35:24 -------------------------------------------------------------------------------- Wound Assessment Details Patient Name: Date of  No Muscle: No Joint: No Bone: No Small (1-33%)] Epithelialization:  [14:Compression Therapy] [23:Compression Therapy] [25:Compression [place] Wound Number: 26 N/A N/A Photos: No Photos N/A N/A Left, Dorsal Foot N/A N/A Wound Location: Gradually Appeared N/A N/A Wounding Event: Diabetic Wound/Ulcer of the Lower N/A N/A Primary Etiology: Extremity N/A N/A N/A Secondary Etiology: Anemia, Sleep Apnea, Arrhythmia, N/A N/A Comorbid History: Congestive Heart Failure, Coronary Artery Disease, Hypertension, Myocardial Infarction, Type II Diabetes, Gout, Neuropathy 08/18/2019 N/A N/A Date Acquired: 22 N/A N/A Weeks of Treatment: Open N/A N/A Wound Status: Yes N/A N/A Clustered Wound: 4 N/A N/A Clustered Quantity: 0.7x0.6x0.1 N/A N/A Measurements L x W x D (cm) 0.33 N/A N/A A (cm) : rea 0.033 N/A N/A Volume (cm) : 40.00% N/A N/A % Reduction in A rea: 40.00% N/A N/A % Reduction in Volume: Grade 2 N/A N/A Classification: Small N/A N/A Exudate A mount: Serosanguineous N/A N/A Exudate Type: red, brown N/A N/A Exudate Color: Flat and Intact N/A N/A Wound Margin: Large (67-100%) N/A N/A Granulation A mount: Red N/A N/A Granulation Quality: Small (1-33%) N/A N/A Necrotic A mount: Fat Layer (Subcutaneous Tissue): Yes N/A N/A Exposed Structures: Fascia: No Tendon: No Muscle: No Joint: No Bone: No Small (1-33%) N/A N/A Epithelialization: Compression Therapy N/A N/A Procedures Performed: Treatment Notes Electronic Signature(s) Signed: 01/19/2020 5:19:28 PM By: Linton Ham MD Signed: 01/19/2020 5:34:55 PM By: [name] RN Entered By: Linton Ham on 01/19/2020 10:24:02 -------------------------------------------------------------------------------- Multi-Disciplinary Care Plan Details Patient Name: Date of Service: [name] [name] F. 01/19/2020 9:45 A M Medical Record Number: [id number] Patient Account Number: [id number] Date of Birth/Sex: Treating RN: [birth year] (74 y.o. [name] Primary Care Treyshon Buch[place]: [name],  [name] Other Clinician: Referring Petronella Shuford: Treating Cola Gane/Extender: [name], [name] [name], [name] Weeks in Treatment: 196 Active Inactive Wound/Skin Impairment Nursing Diagnoses: Impaired tissue integrity Goals: Patient/caregiver will verbalize understanding of skin care regimen Date Initiated: 11/19/2017 Target Resolution Date: 02/05/2020 Goal Status: Active Ulcer/skin breakdown will have a volume reduction of 30% by week 4 Date Initiated: 11/19/2017 Date Inactivated: 12/02/2017 Target Resolution Date: 12/22/2017 Goal Status: Unmet Unmet Reason: multipl comorbidities Interventions: Assess patient/caregiver ability to obtain necessary supplies Assess ulceration(s) every visit Provide education on ulcer and skin care Notes: Electronic Signature(s) Signed: 01/19/2020 5:34:55 PM By: [name] RN Entered By: [name] on 01/19/2020 09:34:59 -------------------------------------------------------------------------------- Pain Assessment Details Patient Name: Date of Service: [name] [name] [facility] F. 01/19/2020 9:45 A M Medical Record Number: [id number] Patient Account Number: [id number] Date of Birth/Sex: Treating RN: Nov 25, 1945 (74 y.o. [name] Primary Care Nickson Middlesworth: [name], [name] Other Clinician: Referring Teion Ballin: Treating Avya Flavell/Extender: [name], [name] [name], [name] Weeks in Treatment: 196 Active Problems Location of Pain Severity and Description of Pain Patient Has Paino No Site Locations Rate the pain. Current Pain Level: 0 Pain Management and Medication Current Pain Management: Electronic Signature(s) Signed: 01/19/2020 5:46:00 PM By: Baruch Gouty RN, BSN Entered By: Baruch Gouty on 01/19/2020 10:08:45 -------------------------------------------------------------------------------- Patient/Caregiver Education Details Patient Name: Date of Service: [name] [name] 9/28/2021andnbsp9:45 A M Medical Record Number: [id number] Patient  Account Number: [id number] Date of Birth/Gender: Treating RN: 01-09-1946 (74 y.o. [name] Primary Care Physician: [name], [name] Other Clinician: Referring Physician: Treating Physician/Extender: [name], [name] [name], [name] Weeks in Treatment: 196 Education Assessment Education Provided To: Patient Education Topics Provided Wound/Skin Impairment: Methods: Explain/Verbal Responses: State content correctly Electronic Signature(s) Signed: 01/19/2020 5:34:55 PM By: [name] RN Entered By: [name] on 01/19/2020 09:35:24 -------------------------------------------------------------------------------- Wound Assessment Details Patient Name: Date of  No Muscle: No Joint: No Bone: No Small (1-33%)] Epithelialization:  [14:Compression Therapy] [23:Compression Therapy] [25:Compression [place] Wound Number: 26 N/A N/A Photos: No Photos N/A N/A Left, Dorsal Foot N/A N/A Wound Location: Gradually Appeared N/A N/A Wounding Event: Diabetic Wound/Ulcer of the Lower N/A N/A Primary Etiology: Extremity N/A N/A N/A Secondary Etiology: Anemia, Sleep Apnea, Arrhythmia, N/A N/A Comorbid History: Congestive Heart Failure, Coronary Artery Disease, Hypertension, Myocardial Infarction, Type II Diabetes, Gout, Neuropathy 08/18/2019 N/A N/A Date Acquired: 22 N/A N/A Weeks of Treatment: Open N/A N/A Wound Status: Yes N/A N/A Clustered Wound: 4 N/A N/A Clustered Quantity: 0.7x0.6x0.1 N/A N/A Measurements L x W x D (cm) 0.33 N/A N/A A (cm) : rea 0.033 N/A N/A Volume (cm) : 40.00% N/A N/A % Reduction in A rea: 40.00% N/A N/A % Reduction in Volume: Grade 2 N/A N/A Classification: Small N/A N/A Exudate A mount: Serosanguineous N/A N/A Exudate Type: red, brown N/A N/A Exudate Color: Flat and Intact N/A N/A Wound Margin: Large (67-100%) N/A N/A Granulation A mount: Red N/A N/A Granulation Quality: Small (1-33%) N/A N/A Necrotic A mount: Fat Layer (Subcutaneous Tissue): Yes N/A N/A Exposed Structures: Fascia: No Tendon: No Muscle: No Joint: No Bone: No Small (1-33%) N/A N/A Epithelialization: Compression Therapy N/A N/A Procedures Performed: Treatment Notes Electronic Signature(s) Signed: 01/19/2020 5:19:28 PM By: Linton Ham MD Signed: 01/19/2020 5:34:55 PM By: [name] RN Entered By: Linton Ham on 01/19/2020 10:24:02 -------------------------------------------------------------------------------- Multi-Disciplinary Care Plan Details Patient Name: Date of Service: [name] [name] F. 01/19/2020 9:45 A M Medical Record Number: [id number] Patient Account Number: [id number] Date of Birth/Sex: Treating RN: [birth year] (74 y.o. [name] Primary Care Treyshon [place]: [name],  [name] Other Clinician: Referring Petronella Shuford: Treating Cola Gane/Extender: [name], [name] [name], [name] Weeks in Treatment: 196 Active Inactive Wound/Skin Impairment Nursing Diagnoses: Impaired tissue integrity Goals: Patient/caregiver will verbalize understanding of skin care regimen Date Initiated: 11/19/2017 Target Resolution Date: 02/05/2020 Goal Status: Active Ulcer/skin breakdown will have a volume reduction of 30% by week 4 Date Initiated: 11/19/2017 Date Inactivated: 12/02/2017 Target Resolution Date: 12/22/2017 Goal Status: Unmet Unmet Reason: multipl comorbidities Interventions: Assess patient/caregiver ability to obtain necessary supplies Assess ulceration(s) every visit Provide education on ulcer and skin care Notes: Electronic Signature(s) Signed: 01/19/2020 5:34:55 PM By: [name] RN Entered By: [name] on 01/19/2020 09:34:59 -------------------------------------------------------------------------------- Pain Assessment Details Patient Name: Date of Service: [name] [name] [facility] F. 01/19/2020 9:45 A M Medical Record Number: [id number] Patient Account Number: [id number] Date of Birth/Sex: Treating RN: Nov 25, 1945 (74 y.o. [name] Primary Care Nickson Middlesworth: [name], [name] Other Clinician: Referring Teion Ballin: Treating Avya Flavell/Extender: [name], [name] [name], [name] Weeks in Treatment: 196 Active Problems Location of Pain Severity and Description of Pain Patient Has Paino No Site Locations Rate the pain. Current Pain Level: 0 Pain Management and Medication Current Pain Management: Electronic Signature(s) Signed: 01/19/2020 5:46:00 PM By: Baruch Gouty RN, BSN Entered By: Baruch Gouty on 01/19/2020 10:08:45 -------------------------------------------------------------------------------- Patient/Caregiver Education Details Patient Name: Date of Service: [name] [name] 9/28/2021andnbsp9:45 A M Medical Record Number: [id number] Patient  Account Number: [id number] Date of Birth/Gender: Treating RN: 01-09-1946 (74 y.o. [name] Primary Care Physician: [name], [name] Other Clinician: Referring Physician: Treating Physician/Extender: [name], [name] [name], [name] Weeks in Treatment: 196 Education Assessment Education Provided To: Patient Education Topics Provided Wound/Skin Impairment: Methods: Explain/Verbal Responses: State content correctly Electronic Signature(s) Signed: 01/19/2020 5:34:55 PM By: [name] RN Entered By: [name] on 01/19/2020 09:35:24 -------------------------------------------------------------------------------- Wound Assessment Details Patient Name: Date of  PM By: [name] RN Entered By: [name] on 01/19/2020 10:23:41 -------------------------------------------------------------------------------- Encounter Discharge Information Details Patient Name: Date of Service: [name] [name] [facility] F. 01/19/2020 9:45 A M Medical Record Number: [id number] Patient Account Number: [id number] Date of Birth/Sex: Treating RN: 02-13-1946 (74 y.o. Marvis Repress Primary Care Yoshi Mancillas: [name], [name] Other Clinician: Referring Ayannah Faddis: Treating Sheran Newstrom/Extender: [name], [name] [name], [name] Weeks in Treatment: 196 Encounter Discharge Information Items Discharge Condition: Stable Ambulatory Status: Wheelchair Discharge Destination: Home Transportation: Private Auto Accompanied By: daughter Schedule Follow-up Appointment: Yes Clinical Summary of Care: Patient Declined Electronic Signature(s) Signed: 01/19/2020 5:04:51 PM By: Kela Millin Entered By: Kela Millin on 01/19/2020 10:43:03 -------------------------------------------------------------------------------- Lower Extremity Assessment Details Patient Name: Date of Service: [name] [name] [facility] F. 01/19/2020 9:45 A  M Medical Record Number: [id number] Patient Account Number: [id number] Date of Birth/Sex: Treating RN: June 13, 1945 (74 y.o. [name] Primary Care Lavilla Delamora: [name], [name] Other Clinician: Referring Berdine Rasmusson: Treating Elton Heid/Extender: [name], [name] [name], [name] Weeks in Treatment: 196 Edema Assessment Assessed: Shirlyn Goltz: No] [Right: No] Edema: [Left: Yes] [Right: Yes] Calf Left: Right: Point of Measurement: 36 cm From Medial Instep 22.7 cm 23 cm Ankle Left: Right: Point of Measurement: 11 cm From Medial Instep 17.8 cm 18.9 cm Vascular Assessment Pulses: Dorsalis Pedis Palpable: [Left:No] [Right:No] Electronic Signature(s) Signed: 01/19/2020 5:46:00 PM By: Baruch Gouty RN, BSN Entered By: Baruch Gouty on 01/19/2020 10:09:58 -------------------------------------------------------------------------------- Multi Wound Chart Details Patient Name: Date of Service: [name] [name] F. 01/19/2020 9:45 A M Medical Record Number: [id number] Patient Account Number: [id number] Date of Birth/Sex: Treating RN: [birth year] (74 y.o. [name]) [name] Primary Care Milanie Rosenfield: [name], [name] Other Clinician: Referring Dinna Severs: Treating Burl Tauzin/Extender: [name], [name] [name], [name] Weeks in Treatment: 196 Vital Signs Height(in): 74 Capillary Blood Glucose(mg/dl): 117 Weight(lbs): 150 Pulse(bpm): 87 Body Mass Index(BMI): 19 Blood Pressure(mmHg): 125/75 Temperature(F): 98.5 Respiratory Rate(breaths/min): 18 Photos: [14:No Photos Left, Lateral Lower Leg] [23:No Photos Right, Lateral Calcaneus] [25:No Photos Right, Lateral Lower Leg] Wound Location: [14:Gradually Appeared] [23:Pressure Injury] [25:Gradually Appeared] Wounding Event: [14:Diabetic Wound/Ulcer of the Lower] [23:Diabetic Wound/Ulcer of the Lower] [25:Venous Leg Ulcer] Primary Etiology: [14:Extremity N/A] [23:Extremity Pressure Ulcer] [25:N/A] Secondary Etiology: [14:Anemia, Sleep Apnea, Arrhythmia,] [23:Anemia,  Sleep Apnea, Arrhythmia,] [25:Anemia, Sleep Apnea, Arrhythmia,] Comorbid History: [14:Congestive Heart Failure, Coronary Artery Disease, Hypertension, Myocardial Infarction, Type II Diabetes, Gout, Neuropathy 08/15/2017] [23:Congestive Heart Failure, Coronary Artery Disease, Hypertension, Myocardial Infarction, Type  II Diabetes, Gout, Neuropathy 04/28/2019] [25:Congestive Heart Failure, Coronary Artery Disease, Hypertension, Myocardial Infarction, Type II Diabetes, Gout, Neuropathy 07/06/2019] Date Acquired: [14:126] [23:38] [25:26] Weeks of Treatment: [14:Open] [23:Open] [25:Open] Wound Status: [14:Yes] [23:No] [25:No] Clustered Wound: [14:2] [23:N/A] [25:N/A] Clustered Quantity: [14:9.5x1.6x0.2] [23:0.3x0.5x0.1] [25:1.4x0.8x0.1] Measurements L x W x D (cm) [14:11.938] [23:0.118] [25:0.88] A (cm) : rea [14:2.388] [56:3.149] [25:0.088] Volume (cm) : [14:-1167.30%] [23:98.00%] [25:67.80%] % Reduction in Area: [14:-2440.40%] [23:98.00%] [25:67.80%] % Reduction in Volume: [14:Grade 2] [23:Grade 2] [25:Full Thickness Without Exposed] Classification: [14:Medium] [23:None Present] [25:Support Structures Medium] Exudate Amount: [14:Serosanguineous] [23:N/A] [25:Serosanguineous] Exudate Type: [14:red, brown] [23:N/A] [25:red, brown] Exudate Color: [14:Flat and Intact] [23:Flat and Intact] [25:Flat and Intact] Wound Margin: [14:Medium (34-66%)] [23:Large (67-100%)] [25:Medium (34-66%)] Granulation Amount: [14:Red, Pink] [23:Red] [25:Red] Granulation Quality: [14:Medium (34-66%)] [23:None Present (0%)] [25:Medium (34-66%)] Necrotic Amount: [14:Fat Layer (Subcutaneous Tissue): Yes Fat Layer (Subcutaneous Tissue): Yes Fat Layer (Subcutaneous Tissue): Yes] Exposed Structures: [14:Fascia: No Tendon: No Muscle: No Joint: No Bone: No Small (1-33%)] [23:Fascia: No Tendon: No Muscle: No Joint: No Bone: No Large (67-100%)] [25:Fascia: No Tendon:  PM By: [name] RN Entered By: [name] on 01/19/2020 10:23:41 -------------------------------------------------------------------------------- Encounter Discharge Information Details Patient Name: Date of Service: [name] [name] [facility] F. 01/19/2020 9:45 A M Medical Record Number: [id number] Patient Account Number: [id number] Date of Birth/Sex: Treating RN: 02-13-1946 (74 y.o. Marvis Repress Primary Care Yoshi Mancillas: [name], [name] Other Clinician: Referring Ayannah Faddis: Treating Sheran Newstrom/Extender: [name], [name] [name], [name] Weeks in Treatment: 196 Encounter Discharge Information Items Discharge Condition: Stable Ambulatory Status: Wheelchair Discharge Destination: Home Transportation: Private Auto Accompanied By: daughter Schedule Follow-up Appointment: Yes Clinical Summary of Care: Patient Declined Electronic Signature(s) Signed: 01/19/2020 5:04:51 PM By: Kela Millin Entered By: Kela Millin on 01/19/2020 10:43:03 -------------------------------------------------------------------------------- Lower Extremity Assessment Details Patient Name: Date of Service: [name] [name] [facility] F. 01/19/2020 9:45 A  M Medical Record Number: [id number] Patient Account Number: [id number] Date of Birth/Sex: Treating RN: June 13, 1945 (74 y.o. [name] Primary Care Lavilla Delamora: [name], [name] Other Clinician: Referring Berdine Rasmusson: Treating Elton Heid/Extender: [name], [name] [name], [name] Weeks in Treatment: 196 Edema Assessment Assessed: Shirlyn Goltz: No] [Right: No] Edema: [Left: Yes] [Right: Yes] Calf Left: Right: Point of Measurement: 36 cm From Medial Instep 22.7 cm 23 cm Ankle Left: Right: Point of Measurement: 11 cm From Medial Instep 17.8 cm 18.9 cm Vascular Assessment Pulses: Dorsalis Pedis Palpable: [Left:No] [Right:No] Electronic Signature(s) Signed: 01/19/2020 5:46:00 PM By: Baruch Gouty RN, BSN Entered By: Baruch Gouty on 01/19/2020 10:09:58 -------------------------------------------------------------------------------- Multi Wound Chart Details Patient Name: Date of Service: [name] [name] F. 01/19/2020 9:45 A M Medical Record Number: [id number] Patient Account Number: [id number] Date of Birth/Sex: Treating RN: [birth year] (74 y.o. [name]) [name] Primary Care Milanie Rosenfield: [name], [name] Other Clinician: Referring Dinna Severs: Treating Burl Tauzin/Extender: [name], [name] [name], [name] Weeks in Treatment: 196 Vital Signs Height(in): 74 Capillary Blood Glucose(mg/dl): 117 Weight(lbs): 150 Pulse(bpm): 87 Body Mass Index(BMI): 19 Blood Pressure(mmHg): 125/75 Temperature(F): 98.5 Respiratory Rate(breaths/min): 18 Photos: [14:No Photos Left, Lateral Lower Leg] [23:No Photos Right, Lateral Calcaneus] [25:No Photos Right, Lateral Lower Leg] Wound Location: [14:Gradually Appeared] [23:Pressure Injury] [25:Gradually Appeared] Wounding Event: [14:Diabetic Wound/Ulcer of the Lower] [23:Diabetic Wound/Ulcer of the Lower] [25:Venous Leg Ulcer] Primary Etiology: [14:Extremity N/A] [23:Extremity Pressure Ulcer] [25:N/A] Secondary Etiology: [14:Anemia, Sleep Apnea, Arrhythmia,] [23:Anemia,  Sleep Apnea, Arrhythmia,] [25:Anemia, Sleep Apnea, Arrhythmia,] Comorbid History: [14:Congestive Heart Failure, Coronary Artery Disease, Hypertension, Myocardial Infarction, Type II Diabetes, Gout, Neuropathy 08/15/2017] [23:Congestive Heart Failure, Coronary Artery Disease, Hypertension, Myocardial Infarction, Type  II Diabetes, Gout, Neuropathy 04/28/2019] [25:Congestive Heart Failure, Coronary Artery Disease, Hypertension, Myocardial Infarction, Type II Diabetes, Gout, Neuropathy 07/06/2019] Date Acquired: [14:126] [23:38] [25:26] Weeks of Treatment: [14:Open] [23:Open] [25:Open] Wound Status: [14:Yes] [23:No] [25:No] Clustered Wound: [14:2] [23:N/A] [25:N/A] Clustered Quantity: [14:9.5x1.6x0.2] [23:0.3x0.5x0.1] [25:1.4x0.8x0.1] Measurements L x W x D (cm) [14:11.938] [23:0.118] [25:0.88] A (cm) : rea [14:2.388] [56:3.149] [25:0.088] Volume (cm) : [14:-1167.30%] [23:98.00%] [25:67.80%] % Reduction in Area: [14:-2440.40%] [23:98.00%] [25:67.80%] % Reduction in Volume: [14:Grade 2] [23:Grade 2] [25:Full Thickness Without Exposed] Classification: [14:Medium] [23:None Present] [25:Support Structures Medium] Exudate Amount: [14:Serosanguineous] [23:N/A] [25:Serosanguineous] Exudate Type: [14:red, brown] [23:N/A] [25:red, brown] Exudate Color: [14:Flat and Intact] [23:Flat and Intact] [25:Flat and Intact] Wound Margin: [14:Medium (34-66%)] [23:Large (67-100%)] [25:Medium (34-66%)] Granulation Amount: [14:Red, Pink] [23:Red] [25:Red] Granulation Quality: [14:Medium (34-66%)] [23:None Present (0%)] [25:Medium (34-66%)] Necrotic Amount: [14:Fat Layer (Subcutaneous Tissue): Yes Fat Layer (Subcutaneous Tissue): Yes Fat Layer (Subcutaneous Tissue): Yes] Exposed Structures: [14:Fascia: No Tendon: No Muscle: No Joint: No Bone: No Small (1-33%)] [23:Fascia: No Tendon: No Muscle: No Joint: No Bone: No Large (67-100%)] [25:Fascia: No Tendon:  No Muscle: No Joint: No Bone: No Small (1-33%)] Epithelialization:  [14:Compression Therapy] [23:Compression Therapy] [25:Compression [place] Wound Number: 26 N/A N/A Photos: No Photos N/A N/A Left, Dorsal Foot N/A N/A Wound Location: Gradually Appeared N/A N/A Wounding Event: Diabetic Wound/Ulcer of the Lower N/A N/A Primary Etiology: Extremity N/A N/A N/A Secondary Etiology: Anemia, Sleep Apnea, Arrhythmia, N/A N/A Comorbid History: Congestive Heart Failure, Coronary Artery Disease, Hypertension, Myocardial Infarction, Type II Diabetes, Gout, Neuropathy 08/18/2019 N/A N/A Date Acquired: 22 N/A N/A Weeks of Treatment: Open N/A N/A Wound Status: Yes N/A N/A Clustered Wound: 4 N/A N/A Clustered Quantity: 0.7x0.6x0.1 N/A N/A Measurements L x W x D (cm) 0.33 N/A N/A A (cm) : rea 0.033 N/A N/A Volume (cm) : 40.00% N/A N/A % Reduction in A rea: 40.00% N/A N/A % Reduction in Volume: Grade 2 N/A N/A Classification: Small N/A N/A Exudate A mount: Serosanguineous N/A N/A Exudate Type: red, brown N/A N/A Exudate Color: Flat and Intact N/A N/A Wound Margin: Large (67-100%) N/A N/A Granulation A mount: Red N/A N/A Granulation Quality: Small (1-33%) N/A N/A Necrotic A mount: Fat Layer (Subcutaneous Tissue): Yes N/A N/A Exposed Structures: Fascia: No Tendon: No Muscle: No Joint: No Bone: No Small (1-33%) N/A N/A Epithelialization: Compression Therapy N/A N/A Procedures Performed: Treatment Notes Electronic Signature(s) Signed: 01/19/2020 5:19:28 PM By: Linton Ham MD Signed: 01/19/2020 5:34:55 PM By: [name] RN Entered By: Linton Ham on 01/19/2020 10:24:02 -------------------------------------------------------------------------------- Multi-Disciplinary Care Plan Details Patient Name: Date of Service: [name] [name] F. 01/19/2020 9:45 A M Medical Record Number: [id number] Patient Account Number: [id number] Date of Birth/Sex: Treating RN: [birth year] (74 y.o. [name] Primary Care Treyshon Buch[place]: [name],  [name] Other Clinician: Referring Petronella Shuford: Treating Cola Gane/Extender: [name], [name] [name], [name] Weeks in Treatment: 196 Active Inactive Wound/Skin Impairment Nursing Diagnoses: Impaired tissue integrity Goals: Patient/caregiver will verbalize understanding of skin care regimen Date Initiated: 11/19/2017 Target Resolution Date: 02/05/2020 Goal Status: Active Ulcer/skin breakdown will have a volume reduction of 30% by week 4 Date Initiated: 11/19/2017 Date Inactivated: 12/02/2017 Target Resolution Date: 12/22/2017 Goal Status: Unmet Unmet Reason: multipl comorbidities Interventions: Assess patient/caregiver ability to obtain necessary supplies Assess ulceration(s) every visit Provide education on ulcer and skin care Notes: Electronic Signature(s) Signed: 01/19/2020 5:34:55 PM By: [name] RN Entered By: [name] on 01/19/2020 09:34:59 -------------------------------------------------------------------------------- Pain Assessment Details Patient Name: Date of Service: [name] [name] [facility] F. 01/19/2020 9:45 A M Medical Record Number: [id number] Patient Account Number: [id number] Date of Birth/Sex: Treating RN: Nov 25, 1945 (74 y.o. [name] Primary Care Nickson Middlesworth: [name], [name] Other Clinician: Referring Teion Ballin: Treating Avya Flavell/Extender: [name], [name] [name], [name] Weeks in Treatment: 196 Active Problems Location of Pain Severity and Description of Pain Patient Has Paino No Site Locations Rate the pain. Current Pain Level: 0 Pain Management and Medication Current Pain Management: Electronic Signature(s) Signed: 01/19/2020 5:46:00 PM By: Baruch Gouty RN, BSN Entered By: Baruch Gouty on 01/19/2020 10:08:45 -------------------------------------------------------------------------------- Patient/Caregiver Education Details Patient Name: Date of Service: [name] [name] 9/28/2021andnbsp9:45 A M Medical Record Number: [id number] Patient  Account Number: [id number] Date of Birth/Gender: Treating RN: 01-09-1946 (74 y.o. [name] Primary Care Physician: [name], [name] Other Clinician: Referring Physician: Treating Physician/Extender: [name], [name] [name], [name] Weeks in Treatment: 196 Education Assessment Education Provided To: Patient Education Topics Provided Wound/Skin Impairment: Methods: Explain/Verbal Responses: State content correctly Electronic Signature(s) Signed: 01/19/2020 5:34:55 PM By: [name] RN Entered By: [name] on 01/19/2020 09:35:24 -------------------------------------------------------------------------------- Wound Assessment Details Patient Name: Date of

## 2020-01-19 NOTE — Progress Notes (Signed)
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 [name] [name] F. 01/19/2020 Medical Record Number: [id number] Patient Account Number: [id number] Date of Birth/Sex: Treating RN: 03-07-1946 (74 y.o. [name] Primary Care Provider: [name],  [name] Other Clinician: Referring Provider: Treating Provider/Extender: Caretha Rumbaugh [name], [name] Weeks in Treatment: 196 Diagnosis Coding ICD-10 Codes Code Description [phone] Pressure ulcer of right heel, stage 3 L97.221 Non-pressure chronic ulcer of left calf limited to breakdown of skin L97.321 Non-pressure chronic ulcer of left ankle limited to breakdown of skin L97.521 Non-pressure chronic ulcer of other part of left foot limited to breakdown of skin L97.211 Non-pressure chronic ulcer of right calf limited to breakdown of skin Facility Procedures CPT4: Code [id number] Description: 81 BILATERAL: Application of multi-layer venous compression system; leg (below knee), including ankle and t. Modifier: Quantity: 1 Physician Procedures : CPT4 Code Description Modifier [id number] - WC PHYS LEVEL 3 - EST PT ICD-10 Diagnosis Description L89.613 Pressure ulcer of right heel, stage 3 L97.221 Non-pressure chronic ulcer of left calf limited to breakdown of skin L97.211 Non-pressure chronic  ulcer of right calf limited to breakdown of skin Quantity: 1 Electronic Signature(s) Signed: 01/19/2020 5:19:28 PM By: Linton Ham MD Entered By: Linton Ham on 01/19/2020 10:31:46

## 2020-01-27 ENCOUNTER — Other Ambulatory Visit: Payer: Self-pay | Admitting: Family Medicine

## 2020-01-27 DIAGNOSIS — F320 Major depressive disorder, single episode, mild: Secondary | ICD-10-CM

## 2020-01-27 DIAGNOSIS — N312 Flaccid neuropathic bladder, not elsewhere classified: Secondary | ICD-10-CM | POA: Diagnosis not present

## 2020-02-16 ENCOUNTER — Encounter (HOSPITAL_BASED_OUTPATIENT_CLINIC_OR_DEPARTMENT_OTHER): Payer: Medicare HMO | Attending: Internal Medicine | Admitting: Internal Medicine

## 2020-02-16 ENCOUNTER — Ambulatory Visit: Payer: Medicare HMO

## 2020-02-16 ENCOUNTER — Other Ambulatory Visit: Payer: Self-pay

## 2020-02-16 DIAGNOSIS — L97321 Non-pressure chronic ulcer of left ankle limited to breakdown of skin: Secondary | ICD-10-CM | POA: Insufficient documentation

## 2020-02-16 DIAGNOSIS — L97521 Non-pressure chronic ulcer of other part of left foot limited to breakdown of skin: Secondary | ICD-10-CM | POA: Diagnosis not present

## 2020-02-16 DIAGNOSIS — E11621 Type 2 diabetes mellitus with foot ulcer: Secondary | ICD-10-CM | POA: Diagnosis not present

## 2020-02-16 DIAGNOSIS — L97522 Non-pressure chronic ulcer of other part of left foot with fat layer exposed: Secondary | ICD-10-CM | POA: Diagnosis not present

## 2020-02-16 DIAGNOSIS — L97221 Non-pressure chronic ulcer of left calf limited to breakdown of skin: Secondary | ICD-10-CM | POA: Diagnosis not present

## 2020-02-16 DIAGNOSIS — Z794 Long term (current) use of insulin: Secondary | ICD-10-CM

## 2020-02-16 DIAGNOSIS — L97211 Non-pressure chronic ulcer of right calf limited to breakdown of skin: Secondary | ICD-10-CM | POA: Diagnosis not present

## 2020-02-16 DIAGNOSIS — E785 Hyperlipidemia, unspecified: Secondary | ICD-10-CM

## 2020-02-16 DIAGNOSIS — E1121 Type 2 diabetes mellitus with diabetic nephropathy: Secondary | ICD-10-CM

## 2020-02-16 DIAGNOSIS — L89613 Pressure ulcer of right heel, stage 3: Secondary | ICD-10-CM | POA: Insufficient documentation

## 2020-02-16 NOTE — Chronic Care Management (AMB) (Signed)
Chronic Care Management Pharmacy  Name: [name] [name]   MRN: [id number]   DOB: [birth year]  Chief Complaint/ HPI  [name] [name],  74 y.o. , male presents for their Follow-Up CCM visit with the clinical pharmacist via telephone due to COVID-19 Pandemic.  PCP : [name], [name] G, MD  Their chronic conditions include: HTN, HLD, DM, HF, CAD, ventricular tachycardia, neurogenic bladder  Office Visits: 06/24/2019 [name], MD - Patient presented for office visit for follow up on recent ER visit. Stitches removed. A1c: 12.6 (06/24/2019). Lantus dose was increased from 30 units to 35 units. Humalog was added: 5-10 units before meals three times a day.   Consult Visit: 01/19/20 Linton Ham, MD (wound care): Patient presented for debridement of wounds.   12/29/19 Festus Aloe, MD (urology): Patient presented for follow up. Unable to access notes.  11/24/19 Link Snuffer, MD (urology): Patient presented for follow up. Unable to access notes.  03/18/2019- Cardiology- Glori Bickers, MD- Patient presented for office visit for follow up. Patient presented to be stable. Patient to continue current regimen.   Medications: Outpatient Encounter Medications as of 02/16/2020  Medication Sig  . Alcohol Swabs (B-D SINGLE USE SWABS REGULAR) PADS USE TO TEST BLOOD SUGAR DAILY  . amiodarone (PACERONE) 200 MG tablet TAKE 1 TABLET EVERY DAY  . Ascorbic Acid (VITAMIN C PO) Take 1 tablet by mouth daily. 1041m  . aspirin EC 81 MG tablet Take 81 mg by mouth daily.  . Blood Glucose Calibration (TRUE METRIX LEVEL 1) Low SOLN USE TO TEST BLOOD SUGAR DAILY  . Blood Glucose Monitoring Suppl (TRUE METRIX AIR GLUCOSE METER) w/Device KIT 1 Device by Does not apply route daily.  . Blood Pressure Monitoring (BLOOD PRESSURE MONITOR AUTOMAT) DEVI To check BP daily  . carvedilol (COREG) 3.125 MG tablet TAKE 1 TABLET TWICE DAILY WITH MEALS  . clopidogrel (PLAVIX) 75 MG tablet TAKE 1 TABLET EVERY DAY  .  clotrimazole-betamethasone (LOTRISONE) cream Apply 1 application topically daily as needed. mall amount. (Patient not taking: Reported on 08/19/2019)  . finasteride (PROSCAR) 5 MG tablet TAKE 1 TABLET EVERY DAY  . HYDROcodone-acetaminophen (NORCO/VICODIN) 5-325 MG tablet Take 1 tablet by mouth every 6 (six) hours as needed for moderate pain.  .Marland Kitcheninsulin aspart (NOVOLOG) 100 UNIT/ML injection Inject 10 Units into the skin 3 (three) times daily with meals. You can add extra insulin for sugars over 150 to the mealtime dose based on the sugar before the meal: Add 1 unit of NovoLog if your sugar is 150-200 Add 3 units of NovoLog if your sugar is 201-250 Add 5 units of NovoLog if your sugar is 251-300 Add 7 units of NovoLog if your sugar is over 301  . insulin glargine (LANTUS SOLOSTAR) 100 UNIT/ML Solostar Pen Inject 35 Units into the skin at bedtime.  .Marland Kitchenlosartan (COZAAR) 25 MG tablet TAKE 1/2 TABLET TWICE DAILY  . MAGNESIUM PO Take 400 mg by mouth every other day.   . mirtazapine (REMERON) 15 MG tablet TAKE 1 TABLET (15 MG TOTAL) BY MOUTH AT BEDTIME.  . multivitamin-iron-minerals-folic acid (THERAPEUTIC-M) TABS tablet Take 1 tablet by mouth daily. Gummies- one a day MEN  . nystatin (MYCOSTATIN/NYSTOP) powder Apply topically 3 (three) times daily. (Patient taking differently: Apply topically as needed. )  . Probiotic Product (PROBIOTIC PO) Take 1 capsule by mouth daily.  . rosuvastatin (CRESTOR) 40 MG tablet TAKE 1 TABLET EVERY DAY  . SANTYL ointment   . tamsulosin (FLOMAX) 0.4 MG CAPS capsule TAKE 1  CAPSULE EVERY DAY  . torsemide (DEMADEX) 20 MG tablet TAKE 1 TABLET EVERY OTHER DAY  . TRUE METRIX BLOOD GLUCOSE TEST test strip USE  AS  INSTRUCTED  . TRUEplus Lancets 30G MISC USE  TO  TEST BLOOD SUGAR EVERY DAY  . vitamin A 10000 UNIT capsule Take 10,000 Units by mouth daily.  Marland Kitchen zinc sulfate 220 (50 Zn) MG capsule Take 1,000 mg by mouth daily.    No facility-administered encounter medications on file as  of 02/16/2020.     Current Diagnosis/Assessment:  Goals Addressed            This Visit's Progress   . Pharmacy Care Plan       CARE PLAN ENTRY  Current Barriers:  . Chronic Disease Management support, education, and care coordination needs related to Hypertension, Hyperlipidemia, Diabetes, Heart Failure, Coronary Artery Disease, and Ventricular tachycardia, Neurogenic bladder   Hypertension . Pharmacist Clinical Goal(s): o Over the next 90 days, patient will work with PharmD and providers to maintain BP goal < 130/80 . Current regimen:   Carvedilol (Coreg) 3.125, 1 tablet twice daily with a meal  Losartan 32m, 0.5 (one-half) tablet twice daily . Patient self care activities - Over the next 90 days, patient will: o Check blood pressure daily, document, and provide at future appointments o Ensure daily salt intake < 1500 mg/day  Hyperlipidemia/ coronary artery disease (s/p bypass graft, ischemic cardiomyopathy, NSTEMI) . Pharmacist Clinical Goal(s): o Over the next 90 days, patient will work with PharmD and providers to maintain LDL goal < 70 . Current regimen:   Rosuvastatin 440m 1 tablet once daily   Aspirin 8170m tablet once daily  Clopidogrel 26m12m tablet once daily . Patient self care activities - Over the next 90  days, patient will: o Continue current therapy.  o Schedule appointment for labs to be rechecked for lipid panel  Diabetes . Pharmacist Clinical Goal(s): o Over the next 90 days, patient will work with PharmD and providers to achieve A1c goal < 8 . Current regimen:   Insulin aspart (Novolog), inject 10 units three times daily with meals (add 1 unit for sugar: 150-200, add 3 units for sugar: 201-250, add 5 units for sugars: 251-300, add 7 units for sugars..   Insulin glargine (Lantus Solostar), inject 35 units at bedtime  . Interventions: o We discussed: how to recognize and treat signs of hypoglycemia  . Patient self care activities - Over the  next 90 days, patient will: o Check blood sugar three times a day, document, and provide at future appointments o Contact provider with any episodes of hypoglycemia    Heart failure . Pharmacist Clinical Goal(s) o Over the next 90 days, patient will work with PharmD and providers to maintain stable fluid status . Current regimen:   Carvedilol (Coreg) 3.125, 1 tablet twice daily with a meal  Losartan 25mg16m5 (one-half) tablet twice daily  Torsemide 20mg,42mablet every other day . Patient self care activities o Patient will current current medications and schedule follow up appointment with Dr. BensimHaroldine Lawstricular tachycardia . Current regimen:  o Amiodarone 200mg, 15mblet once daily . Patient self care activities o Patient will continue current medications and follow up with Dr. BensimhHaroldine Lawsogenic bladder . Current regimen:   Tamsulosin 0.4mg, 1 35msule once daily  Finasteride 5mg, 1 t61met once daily . Patient self care activities o Continue current medications  Medication management . Pharmacist Clinical Goal(s): o Over the next 90 days,  patient will work with PharmD and providers to maintain optimal medication adherence . Current pharmacy: [facility] . Interventions o Comprehensive medication review performed. o Continue current medication management strategy . Patient self care activities - Over the next 90 days, patient will: o Take medications as prescribed o Report any questions or concerns to PharmD and/or provider(s)  Please see past updates related to this goal by clicking on the "Past Updates" button in the selected goal           Diabetes  A1C goal of < 8%  Recent Relevant Labs: Lab Results  Component Value Date/Time   HGBA1C 12.6 (A) 06/24/2019 04:20 PM   HGBA1C 8.0 (A) 02/11/2018 09:29 AM   HGBA1C 7.7 (H) 09/10/2017 11:57 AM   HGBA1C 8.7 (H) 04/09/2017 12:44 PM   MICROALBUR 2.9 (H) 09/10/2017 11:57 AM   MICROALBUR 9.3 (H)  04/09/2017 12:44 PM    Checking BG: 3x per Day  Recent BG Readings:  AM: 117-118 Before lunch: 117-122  Before supper: 125   Patient notes big difference in BGs; all have been < 150, no BGs < 70 or 200    Patient has failed these meds in past: none   Patient is currently controlled on the following medications:   Insulin aspart (Novolog), inject 10 units three times daily with meals (add 1 unit for sugar: 150-200, add 3 units for sugar: 201-250, add 5 units for sugars: 251-300, add 7 units for sugars  Insulin glargine (Lantus Solostar), inject 35 units at bedtime   Last diabetic Eye exam: No results found for: HMDIABEYEEXA  - needs to set up appts.   Last diabetic Foot exam: No results found for: HMDIABFOOTEX   We discussed: how to recognize and treat signs of hypoglycemia  - Patient denies BGs < 70 and has not had symptoms of hypoglycemia -Recommended for patient to set up annual ophthalmology exam. -Discussed switching checking BGs after meals and before bedtime  Plan A1C is overdue - schedule appt with PCP for A1C check. Consider CGM depending on results of A1C. Continue current medications    Heart Failure  denies no current swelling. No sob at rest; only with activity.    Type: Combined Systolic and Diastolic  NYHA Class: III (marked limitation of activity) AHA HF Stage: C (Heart disease and symptoms present)  Patient is currently controlled on the following medications:   Carvedilol (Coreg) 3.125, 1 tablet twice daily with a meal  Losartan 42m, 0.5 (one-half) tablet twice daily  Torsemide 246m 1 tablet every other day  We discussed weighing daily; if you gain more than 3 pounds in one day or 5 pounds in one week call your doctor  - patient unable to stand; patient is unmobile -Diet: Patient doesn't use salt on food and neither does wife who does the cooking; he reports rarely getting food from restaurants and does not eat frozen or canned  foods.  Plan Managed by Dr. DaGlori BickersRecommended for patient to make a follow up as he is due in Nov. Continue current medications  Hypertension  Denies dizziness.   BP today is:  <130/80  Office blood pressures are  BP Readings from Last 3 Encounters:  06/24/19 124/70  06/16/19 117/82  03/18/19 110/80    Patient home BP readings are ranging: 125/70 in AM prior to medications and 100/80 in PM  Patient is controlled on:   Carvedilol (Coreg) 3.125, 1 tablet twice daily with a meal  Losartan 2517m0.5 (one-half)  tablet twice daily  We discussed: checking BP after morning medications  Plan Managed by Dr. Glori Bickers Continue current medications   Hyperlipidemia/ ASCVD (CAD s/p bypass graft, ischemic cardiomyopathy, NSTEMI)  LDL goal < 70  Lipid Panel     Component Value Date/Time   CHOL 120 03/09/2014 0940   TRIG 123 03/09/2014 0940   HDL 39 (L) 03/09/2014 0940   CHOLHDL 3.1 03/09/2014 0940   VLDL 25 03/09/2014 0940   LDLCALC 56 03/09/2014 0940   LDLDIRECT 100.5 02/08/2012 0903    The ASCVD Risk score (Goff DC Jr., et al., 2013) failed to calculate for the following reasons:   The patient has a prior MI or stroke diagnosis    Patient is currently controlled on the following medications:   Rosuvastatin 48m, 1 tablet once daily   Aspirin 877m1 tablet once daily  Clopidogrel 7562m1 tablet once daily  We dicussed: symptoms of bleeding (blood in urine or stool, nosebleeds, unexpected bruising)  Plan Managed by Dr. DanGlori Bickersatient is overdue for lipid panel - last checked 2015. Continue current medications  Ventricular tachycardia  Patient has failed these meds in past: none  Patient is currently controlled on the following medications:   Amiodarone 200m19m tablet once daily  Plan  Recommended obtain eye doc appt for monitoring  Continue current medications  Neurogenic bladder  Patient has failed these meds in past: none   Patient is currently controlled on the following medications:   Tamsulosin 0.4mg,4mcapsule once daily  Finasteride 5mg, 67mablet once daily  Patient has permanent cath and gets changed monthly   Plan Continue current medications   Unclear diagnosis  Patient has failed these meds in past: none  Patient is currently on the following medications:   Mirtazapine 15mg, 64mblet at bedtime (rest)   Plan Continue current medications   OTC/ supplements  Patient is currently on the following:  Ascorbic acid 1000mg, 150mlet twice daily  Clortrimazole- betamethasone (Lotrisone) cream, apply once daily as needed  Magnesium 400mg, 1 71met every other day  Multivitamin- iron-minerals-folic acid, 1 tablet once daily  Nystatin powder, apply three times daiiy  Can use corn starch.   Probiotic, 1 capsule once daily  Vitamin A 2400mcg  (126m0 units, 1 capsule once daily  Zinc sulfate 1000mg, 1 ca29me once daily   Medication Management  Patient organizes medications: pill box organized by daughter.  Barriers: no issues.  [facility]  Follow up Follow up visit with PharmD in 3 months.   Lyndal Alamillo PrJeni Sallesinical Pharmacist [place]

## 2020-02-18 NOTE — Progress Notes (Signed)
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 of other part of left foot limited to breakdown of skin Quantity: 1 Electronic Signature(s) Signed: 02/16/2020 4:47:47 PM By: Linton Ham MD Entered By: Linton Ham on 02/16/2020 12:06:12  zinc oxide barrier cream to left dorsal foot. Moisturizing lotion Other: - TCA to left lower extrimity in clinic Wound Cleansing: May shower and wash wound with soap and water. - on days that dressing is changed Primary Wound Dressing: Wound #14 Left,Lateral Lower Leg: Polymem Silver Wound #25 Right,Lateral Lower Leg: Polymem Silver Wound #26  Left,Dorsal Foot: Polymem Silver Secondary Dressing: Wound #14 Left,Lateral Lower Leg: Dry Gauze Wound #25 Right,Lateral Lower Leg: Dry Gauze Wound #26 Left,Dorsal Foot: Dry Gauze Edema Control: Kerlix and Coban - Bilateral - use cotton roll not kerlix LIGHTLY Avoid standing for long periods of time Elevate legs to the level of the heart or above for 30 minutes daily and/or when sitting, a frequency of: - throughout the day Off-Loading: Multipodus Splint to: - patient to wear bunny boots to both feet while resting in chair and bed. ensure not to apply pressure to both heels. float heels. 1. We are using polymen Ag to all wound areas. Paradoxically this seems to have done very well especially with the left lateral leg wound 2. He has nothing for the longstanding areas on his heels which were pressure ulcers. I cautioned him that we have healed these out once before only to have them reopened. 3. I think the patient probably has chronic venous insufficiency with some degree of PAD. 4. The area on his left ankle was debrided. We are still using polymen here. I wonder about a contact dermatitis on the dorsal foot I will have to look at this next time 5. I have cautioned him to keep the pressure off the lateral part of his foot he will and lower legs. This really looks like pressure injury although he does not really give the history Electronic Signature(s) Signed: 02/16/2020 4:47:47 PM By: Linton Ham MD Entered By: Linton Ham on 02/16/2020 12:04:50 -------------------------------------------------------------------------------- SuperBill Details Patient Name: Date of Service: [name] 02/16/2020 Medical Record Number: [id number] Patient Account Number: [id number] Date of Birth/Sex: Treating RN: June 06, 1945 (74 y.o. 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 to breakdown of skin L97.321 Non-pressure chronic ulcer of left ankle limited to breakdown of skin L97.521 Non-pressure chronic ulcer of other part of left foot limited to breakdown of skin L97.211 Non-pressure chronic ulcer of right calf limited to breakdown of skin Facility Procedures CPT4 Code: 54650354 11 IC Description: 042 - DEB SUBQ TISSUE 20 SQ CM/< D-10 Diagnosis Description L97.521 Non-pressure chronic ulcer of other part of left foot limited to breakdown of skin Modifier: 1 Quantity: Physician Procedures : CPT4 Code Description Modifier [id number] 11042 - WC PHYS SUBQ TISS 20 SQ CM ICD-10 Diagnosis Description L97.521 Non-pressure chronic ulcer of other part of left foot limited to breakdown of skin Quantity: 1 Electronic Signature(s) Signed: 02/16/2020 4:47:47 PM By: Linton Ham MD Entered By: Linton Ham on 02/16/2020 12:06:12  zinc oxide barrier cream to left dorsal foot. Moisturizing lotion Other: - TCA to left lower extrimity in clinic Wound Cleansing: May shower and wash wound with soap and water. - on days that dressing is changed Primary Wound Dressing: Wound #14 Left,Lateral Lower Leg: Polymem Silver Wound #25 Right,Lateral Lower Leg: Polymem Silver Wound #26  Left,Dorsal Foot: Polymem Silver Secondary Dressing: Wound #14 Left,Lateral Lower Leg: Dry Gauze Wound #25 Right,Lateral Lower Leg: Dry Gauze Wound #26 Left,Dorsal Foot: Dry Gauze Edema Control: Kerlix and Coban - Bilateral - use cotton roll not kerlix LIGHTLY Avoid standing for long periods of time Elevate legs to the level of the heart or above for 30 minutes daily and/or when sitting, a frequency of: - throughout the day Off-Loading: Multipodus Splint to: - patient to wear bunny boots to both feet while resting in chair and bed. ensure not to apply pressure to both heels. float heels. 1. We are using polymen Ag to all wound areas. Paradoxically this seems to have done very well especially with the left lateral leg wound 2. He has nothing for the longstanding areas on his heels which were pressure ulcers. I cautioned him that we have healed these out once before only to have them reopened. 3. I think the patient probably has chronic venous insufficiency with some degree of PAD. 4. The area on his left ankle was debrided. We are still using polymen here. I wonder about a contact dermatitis on the dorsal foot I will have to look at this next time 5. I have cautioned him to keep the pressure off the lateral part of his foot he will and lower legs. This really looks like pressure injury although he does not really give the history Electronic Signature(s) Signed: 02/16/2020 4:47:47 PM By: Linton Ham MD Entered By: Linton Ham on 02/16/2020 12:04:50 -------------------------------------------------------------------------------- SuperBill Details Patient Name: Date of Service: [name] 02/16/2020 Medical Record Number: [id number] Patient Account Number: [id number] Date of Birth/Sex: Treating RN: June 06, 1945 (74 y.o. 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 to breakdown of skin L97.321 Non-pressure chronic ulcer of left ankle limited to breakdown of skin L97.521 Non-pressure chronic ulcer of other part of left foot limited to breakdown of skin L97.211 Non-pressure chronic ulcer of right calf limited to breakdown of skin Facility Procedures CPT4 Code: 54650354 11 IC Description: 042 - DEB SUBQ TISSUE 20 SQ CM/< D-10 Diagnosis Description L97.521 Non-pressure chronic ulcer of other part of left foot limited to breakdown of skin Modifier: 1 Quantity: Physician Procedures : CPT4 Code Description Modifier [id number] 11042 - WC PHYS SUBQ TISS 20 SQ CM ICD-10 Diagnosis Description L97.521 Non-pressure chronic ulcer of other part of left foot limited to breakdown of skin Quantity: 1 Electronic Signature(s) Signed: 02/16/2020 4:47:47 PM By: Linton Ham MD Entered By: Linton Ham on 02/16/2020 12:06:12  zinc oxide barrier cream to left dorsal foot. Moisturizing lotion Other: - TCA to left lower extrimity in clinic Wound Cleansing: May shower and wash wound with soap and water. - on days that dressing is changed Primary Wound Dressing: Wound #14 Left,Lateral Lower Leg: Polymem Silver Wound #25 Right,Lateral Lower Leg: Polymem Silver Wound #26  Left,Dorsal Foot: Polymem Silver Secondary Dressing: Wound #14 Left,Lateral Lower Leg: Dry Gauze Wound #25 Right,Lateral Lower Leg: Dry Gauze Wound #26 Left,Dorsal Foot: Dry Gauze Edema Control: Kerlix and Coban - Bilateral - use cotton roll not kerlix LIGHTLY Avoid standing for long periods of time Elevate legs to the level of the heart or above for 30 minutes daily and/or when sitting, a frequency of: - throughout the day Off-Loading: Multipodus Splint to: - patient to wear bunny boots to both feet while resting in chair and bed. ensure not to apply pressure to both heels. float heels. 1. We are using polymen Ag to all wound areas. Paradoxically this seems to have done very well especially with the left lateral leg wound 2. He has nothing for the longstanding areas on his heels which were pressure ulcers. I cautioned him that we have healed these out once before only to have them reopened. 3. I think the patient probably has chronic venous insufficiency with some degree of PAD. 4. The area on his left ankle was debrided. We are still using polymen here. I wonder about a contact dermatitis on the dorsal foot I will have to look at this next time 5. I have cautioned him to keep the pressure off the lateral part of his foot he will and lower legs. This really looks like pressure injury although he does not really give the history Electronic Signature(s) Signed: 02/16/2020 4:47:47 PM By: Linton Ham MD Entered By: Linton Ham on 02/16/2020 12:04:50 -------------------------------------------------------------------------------- SuperBill Details Patient Name: Date of Service: [name] 02/16/2020 Medical Record Number: [id number] Patient Account Number: [id number] Date of Birth/Sex: Treating RN: June 06, 1945 (74 y.o. 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 to breakdown of skin L97.321 Non-pressure chronic ulcer of left ankle limited to breakdown of skin L97.521 Non-pressure chronic ulcer of other part of left foot limited to breakdown of skin L97.211 Non-pressure chronic ulcer of right calf limited to breakdown of skin Facility Procedures CPT4 Code: 54650354 11 IC Description: 042 - DEB SUBQ TISSUE 20 SQ CM/< D-10 Diagnosis Description L97.521 Non-pressure chronic ulcer of other part of left foot limited to breakdown of skin Modifier: 1 Quantity: Physician Procedures : CPT4 Code Description Modifier [id number] 11042 - WC PHYS SUBQ TISS 20 SQ CM ICD-10 Diagnosis Description L97.521 Non-pressure chronic ulcer of other part of left foot limited to breakdown of skin Quantity: 1 Electronic Signature(s) Signed: 02/16/2020 4:47:47 PM By: Linton Ham MD Entered By: Linton Ham on 02/16/2020 12:06:12  zinc oxide barrier cream to left dorsal foot. Moisturizing lotion Other: - TCA to left lower extrimity in clinic Wound Cleansing: May shower and wash wound with soap and water. - on days that dressing is changed Primary Wound Dressing: Wound #14 Left,Lateral Lower Leg: Polymem Silver Wound #25 Right,Lateral Lower Leg: Polymem Silver Wound #26  Left,Dorsal Foot: Polymem Silver Secondary Dressing: Wound #14 Left,Lateral Lower Leg: Dry Gauze Wound #25 Right,Lateral Lower Leg: Dry Gauze Wound #26 Left,Dorsal Foot: Dry Gauze Edema Control: Kerlix and Coban - Bilateral - use cotton roll not kerlix LIGHTLY Avoid standing for long periods of time Elevate legs to the level of the heart or above for 30 minutes daily and/or when sitting, a frequency of: - throughout the day Off-Loading: Multipodus Splint to: - patient to wear bunny boots to both feet while resting in chair and bed. ensure not to apply pressure to both heels. float heels. 1. We are using polymen Ag to all wound areas. Paradoxically this seems to have done very well especially with the left lateral leg wound 2. He has nothing for the longstanding areas on his heels which were pressure ulcers. I cautioned him that we have healed these out once before only to have them reopened. 3. I think the patient probably has chronic venous insufficiency with some degree of PAD. 4. The area on his left ankle was debrided. We are still using polymen here. I wonder about a contact dermatitis on the dorsal foot I will have to look at this next time 5. I have cautioned him to keep the pressure off the lateral part of his foot he will and lower legs. This really looks like pressure injury although he does not really give the history Electronic Signature(s) Signed: 02/16/2020 4:47:47 PM By: Linton Ham MD Entered By: Linton Ham on 02/16/2020 12:04:50 -------------------------------------------------------------------------------- SuperBill Details Patient Name: Date of Service: [name] 02/16/2020 Medical Record Number: [id number] Patient Account Number: [id number] Date of Birth/Sex: Treating RN: June 06, 1945 (74 y.o. 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 to breakdown of skin L97.321 Non-pressure chronic ulcer of left ankle limited to breakdown of skin L97.521 Non-pressure chronic ulcer of other part of left foot limited to breakdown of skin L97.211 Non-pressure chronic ulcer of right calf limited to breakdown of skin Facility Procedures CPT4 Code: 54650354 11 IC Description: 042 - DEB SUBQ TISSUE 20 SQ CM/< D-10 Diagnosis Description L97.521 Non-pressure chronic ulcer of other part of left foot limited to breakdown of skin Modifier: 1 Quantity: Physician Procedures : CPT4 Code Description Modifier [id number] 11042 - WC PHYS SUBQ TISS 20 SQ CM ICD-10 Diagnosis Description L97.521 Non-pressure chronic ulcer of other part of left foot limited to breakdown of skin Quantity: 1 Electronic Signature(s) Signed: 02/16/2020 4:47:47 PM By: Linton Ham MD Entered By: Linton Ham on 02/16/2020 12:06:12  zinc oxide barrier cream to left dorsal foot. Moisturizing lotion Other: - TCA to left lower extrimity in clinic Wound Cleansing: May shower and wash wound with soap and water. - on days that dressing is changed Primary Wound Dressing: Wound #14 Left,Lateral Lower Leg: Polymem Silver Wound #25 Right,Lateral Lower Leg: Polymem Silver Wound #26  Left,Dorsal Foot: Polymem Silver Secondary Dressing: Wound #14 Left,Lateral Lower Leg: Dry Gauze Wound #25 Right,Lateral Lower Leg: Dry Gauze Wound #26 Left,Dorsal Foot: Dry Gauze Edema Control: Kerlix and Coban - Bilateral - use cotton roll not kerlix LIGHTLY Avoid standing for long periods of time Elevate legs to the level of the heart or above for 30 minutes daily and/or when sitting, a frequency of: - throughout the day Off-Loading: Multipodus Splint to: - patient to wear bunny boots to both feet while resting in chair and bed. ensure not to apply pressure to both heels. float heels. 1. We are using polymen Ag to all wound areas. Paradoxically this seems to have done very well especially with the left lateral leg wound 2. He has nothing for the longstanding areas on his heels which were pressure ulcers. I cautioned him that we have healed these out once before only to have them reopened. 3. I think the patient probably has chronic venous insufficiency with some degree of PAD. 4. The area on his left ankle was debrided. We are still using polymen here. I wonder about a contact dermatitis on the dorsal foot I will have to look at this next time 5. I have cautioned him to keep the pressure off the lateral part of his foot he will and lower legs. This really looks like pressure injury although he does not really give the history Electronic Signature(s) Signed: 02/16/2020 4:47:47 PM By: Linton Ham MD Entered By: Linton Ham on 02/16/2020 12:04:50 -------------------------------------------------------------------------------- SuperBill Details Patient Name: Date of Service: [name] 02/16/2020 Medical Record Number: [id number] Patient Account Number: [id number] Date of Birth/Sex: Treating RN: June 06, 1945 (74 y.o. 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 to breakdown of skin L97.321 Non-pressure chronic ulcer of left ankle limited to breakdown of skin L97.521 Non-pressure chronic ulcer of other part of left foot limited to breakdown of skin L97.211 Non-pressure chronic ulcer of right calf limited to breakdown of skin Facility Procedures CPT4 Code: 54650354 11 IC Description: 042 - DEB SUBQ TISSUE 20 SQ CM/< D-10 Diagnosis Description L97.521 Non-pressure chronic ulcer of other part of left foot limited to breakdown of skin Modifier: 1 Quantity: Physician Procedures : CPT4 Code Description Modifier [id number] 11042 - WC PHYS SUBQ TISS 20 SQ CM ICD-10 Diagnosis Description L97.521 Non-pressure chronic ulcer of other part of left foot limited to breakdown of skin Quantity: 1 Electronic Signature(s) Signed: 02/16/2020 4:47:47 PM By: Linton Ham MD Entered By: Linton Ham on 02/16/2020 12:06:12  zinc oxide barrier cream to left dorsal foot. Moisturizing lotion Other: - TCA to left lower extrimity in clinic Wound Cleansing: May shower and wash wound with soap and water. - on days that dressing is changed Primary Wound Dressing: Wound #14 Left,Lateral Lower Leg: Polymem Silver Wound #25 Right,Lateral Lower Leg: Polymem Silver Wound #26  Left,Dorsal Foot: Polymem Silver Secondary Dressing: Wound #14 Left,Lateral Lower Leg: Dry Gauze Wound #25 Right,Lateral Lower Leg: Dry Gauze Wound #26 Left,Dorsal Foot: Dry Gauze Edema Control: Kerlix and Coban - Bilateral - use cotton roll not kerlix LIGHTLY Avoid standing for long periods of time Elevate legs to the level of the heart or above for 30 minutes daily and/or when sitting, a frequency of: - throughout the day Off-Loading: Multipodus Splint to: - patient to wear bunny boots to both feet while resting in chair and bed. ensure not to apply pressure to both heels. float heels. 1. We are using polymen Ag to all wound areas. Paradoxically this seems to have done very well especially with the left lateral leg wound 2. He has nothing for the longstanding areas on his heels which were pressure ulcers. I cautioned him that we have healed these out once before only to have them reopened. 3. I think the patient probably has chronic venous insufficiency with some degree of PAD. 4. The area on his left ankle was debrided. We are still using polymen here. I wonder about a contact dermatitis on the dorsal foot I will have to look at this next time 5. I have cautioned him to keep the pressure off the lateral part of his foot he will and lower legs. This really looks like pressure injury although he does not really give the history Electronic Signature(s) Signed: 02/16/2020 4:47:47 PM By: Linton Ham MD Entered By: Linton Ham on 02/16/2020 12:04:50 -------------------------------------------------------------------------------- SuperBill Details Patient Name: Date of Service: [name] 02/16/2020 Medical Record Number: [id number] Patient Account Number: [id number] Date of Birth/Sex: Treating RN: June 06, 1945 (74 y.o. 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 to breakdown of skin L97.321 Non-pressure chronic ulcer of left ankle limited to breakdown of skin L97.521 Non-pressure chronic ulcer of other part of left foot limited to breakdown of skin L97.211 Non-pressure chronic ulcer of right calf limited to breakdown of skin Facility Procedures CPT4 Code: 54650354 11 IC Description: 042 - DEB SUBQ TISSUE 20 SQ CM/< D-10 Diagnosis Description L97.521 Non-pressure chronic ulcer of other part of left foot limited to breakdown of skin Modifier: 1 Quantity: Physician Procedures : CPT4 Code Description Modifier [id number] 11042 - WC PHYS SUBQ TISS 20 SQ CM ICD-10 Diagnosis Description L97.521 Non-pressure chronic ulcer of other part of left foot limited to breakdown of skin Quantity: 1 Electronic Signature(s) Signed: 02/16/2020 4:47:47 PM By: Linton Ham MD Entered By: Linton Ham on 02/16/2020 12:06:12  DAKODA, LAVENTURE ([id number]) Visit Report for 02/16/2020 Debridement Details Patient Name: Date of Service: [name] F. 02/16/2020 9:30 A M Medical Record Number: [id number] Patient Account Number: [id number] Date of Birth/Sex: Treating RN: Jan 08, 1946 (74 y.o. [name], Tammi Klippel Primary Care Provider: [name], [name] Other Clinician: Referring Provider: Treating Provider/Extender: Laydon Martis [name], [name] Weeks in Treatment: 200 Debridement Performed for Assessment: Wound #26 Left,Dorsal Foot Performed By: Physician [name], MD Debridement Type: Debridement Severity of Tissue Pre Debridement: Fat layer exposed Level of Consciousness (Pre-procedure): Awake and Alert Pre-procedure Verification/Time Out Yes - 09:50 Taken: Start Time: 09:51 Pain Control: Other : benzocaine 20% spray T Area Debrided (L x W): otal 0.5 (cm) x 0.5 (cm) = 0.25 (cm) Tissue and other material debrided: Viable, Non-Viable, Slough, Subcutaneous, Skin: Dermis , Fibrin/Exudate, Slough Level: Skin/Subcutaneous Tissue Debridement Description: Excisional Instrument: Curette Bleeding: Minimum Hemostasis Achieved: Pressure End Time: 09:58 Procedural Pain: 0 Post Procedural Pain: 0 Response to Treatment: Procedure was tolerated well Level of Consciousness (Post- Awake and Alert procedure): Post Debridement Measurements of Total Wound Length: (cm) 3.5 Width: (cm) 4 Depth: (cm) 0.2 Volume: (cm) 2.199 Character of Wound/Ulcer Post Debridement: Improved Severity of Tissue Post Debridement: Fat layer exposed Post Procedure Diagnosis Same as Pre-procedure Electronic Signature(s) Signed: 02/16/2020 4:47:47 PM By: Linton Ham MD Signed: 02/18/2020 5:24:43 PM By: Deon Pilling Entered By: Linton Ham on 02/16/2020 11:42:36 -------------------------------------------------------------------------------- HPI Details Patient Name: Date of Service: [name] 02/16/2020  9:30 A M Medical Record Number: [id number] Patient Account Number: [id number] Date of Birth/Sex: Treating RN: 1945/07/16 (74 y.o. [name] Primary Care Provider: [name], [name] Other Clinician: Referring Provider: Treating Provider/Extender: Arik Husmann [name], [name] Weeks in Treatment: 200 History of Present Illness Location: On the left and right lateral forefoot which has been there for about 6 months Quality: Patient reports No Pain. Severity: Patient states wound(s) are getting worse. Duration: Patient has had the wound for > 6 months prior to seeking treatment at the wound center Context: The wound would happen gradually Modifying Factors: Patient wound(s)/ulcer(s) are worsening due to :continual drainage from the wound ssociated Signs and Symptoms: Patient reports having increase discharge. A HPI Description: This patient returns after being seen here till the end of August and he was lost to follow-up. he has been quite debilitated laying in bed most of the time and his condition has deteriorated significantly. He has multiple ulcerations on the heel lateral forefoot and some of his toes. ======== Old notes: 74 year old male known to our practice when he was seen here in February and March and was lost to follow-up when he was admitted to hospital with various medical problems including coronary artery disease and a stroke. Now returns with the problem on the left forefoot where he has an ulceration and this has been there for about 6 months. most recently he was in hospital between July 6 and July 16, when he was admitted and treated for acute respiratory failure is secondary to aspiration pneumonia, large non-STEMI, ischemic cardiomyopathy with systolic and diastolic congestive heart failure with ejection fraction about 15-20%, ventricular tachycardia and has been treated with amiodarone, acute on careful up at the common new acute CVA, acute chronic kidney disease stage  III, anemia, uncontrolled diabetes mellitus with last hemoglobin A1c being 12%. He has had persistent hyperglycemia given recently. Patient has a past medical history of diabetes mellitus, hypertension, combined systolic and diastolic heart failure, peripheral neuropathy, gout, cardiomyopathy with ejection fraction of  right calf limited to breakdown of skin 07/21/2019 No Yes Inactive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

## 2020-02-18 NOTE — Progress Notes (Signed)
COSTAS, SENA ([id number]) Visit Report for 02/16/2020 Arrival Information Details Patient Name: Date of Service: [name] [facility] F. 02/16/2020 9:30 A M Medical Record Number: [id number] Patient Account Number: [id number] Date of Birth/Sex: Treating RN: January 20, 1946 (74 y.o. [name], [name] Primary Care Solveig Fangman: [name], [name] Other Clinician: Referring Hidaya Daniel: Treating Charliene Inoue/Extender: [name], [name] [name], [name] Weeks in Treatment: 200 Visit Information History Since Last Visit Added or deleted any medications: No Patient Arrived: Wheel Chair Any new allergies or adverse reactions: No Arrival Time: 09:11 Had a fall or experienced change in No Accompanied By: daughter activities of daily living that may affect Transfer Assistance: None risk of falls: Patient Identification Verified: Yes Signs or symptoms of abuse/neglect since last visito No Secondary Verification Process Completed: Yes Hospitalized since last visit: No Patient Requires Transmission-Based Precautions: No Implantable device outside of the clinic excluding No Patient Has Alerts: Yes cellular tissue based products placed in the center Patient Alerts: Patient on Blood Thinner since last visit: left ABI 1.0; rt ABI 1.09 Has Dressing in Place as Prescribed: Yes Has Compression in Place as Prescribed: Yes Pain Present Now: No Electronic Signature(s) Signed: 02/18/2020 5:24:43 PM By: Deon Pilling Entered By: Deon Pilling on 02/16/2020 09:16:12 -------------------------------------------------------------------------------- Encounter Discharge Information Details Patient Name: Date of Service: [name] 02/16/2020 9:30 A M Medical Record Number: [id number] Patient Account Number: [id number] Date of Birth/Sex: Treating RN: 12/12/1945 (74 y.o. [name] Primary Care Katilyn Miltenberger: [name], [name] Other Clinician: Referring Quinlan Mcfall: Treating Pami Wool/Extender: [name], [name] [name],  [name] Weeks in Treatment: 200 Encounter Discharge Information Items Post Procedure Vitals Discharge Condition: Stable Temperature (F): 97.6 Ambulatory Status: Wheelchair Pulse (bpm): 75 Discharge Destination: Home Respiratory Rate (breaths/min): 20 Transportation: Private Auto Blood Pressure (mmHg): 109/73 Accompanied By: daughter Schedule Follow-up Appointment: Yes Clinical Summary of Care: Patient Declined Electronic Signature(s) Signed: 02/16/2020 5:15:49 PM By: Levan Hurst RN, BSN Entered By: Levan Hurst on 02/16/2020 10:59:25 -------------------------------------------------------------------------------- Lower Extremity Assessment Details Patient Name: Date of Service: [name] 02/16/2020 9:30 A M Medical Record Number: [id number] Patient Account Number: [id number] Date of Birth/Sex: Treating RN: May 13, 1945 (74 y.o. [name] Primary Care Rhylen Pulido: [name], [name] Other Clinician: Referring Arlyn Bumpus: Treating Nakeesha Bowler/Extender: [name], [name] [name], [name] Weeks in Treatment: 200 Edema Assessment Assessed: Shirlyn Goltz: Yes] [Right: Yes] Edema: [Left: No] [Right: No] Calf Left: Right: Point of Measurement: 36 cm From Medial Instep 22 cm 23 cm Ankle Left: Right: Point of Measurement: 11 cm From Medial Instep 17.8 cm 18 cm Electronic Signature(s) Signed: 02/18/2020 5:24:43 PM By: Deon Pilling Entered By: Deon Pilling on 02/16/2020 09:28:58 -------------------------------------------------------------------------------- Multi Wound Chart Details Patient Name: Date of Service: [name] 02/16/2020 9:30 A M Medical Record Number: [id number] Patient Account Number: [id number] Date of Birth/Sex: Treating RN: [birth year] (74 y.o. [name] Primary Care Corlene Sabia: [name], [name] Other Clinician: Referring Eliora Nienhuis: Treating Bernard Donahoo/Extender: [name], [name] [name], [name] Weeks in Treatment: 200 Vital Signs Height(in):  74 Capillary Blood Glucose(mg/dl): 112 Weight(lbs): 150 Pulse(bpm): 75 Body Mass Index(BMI): 19 Blood Pressure(mmHg): 109/73 Temperature(F): 97.6 Respiratory Rate(breaths/min): 20 Photos: [14:No Photos Left, Lateral Lower Leg] [23:No Photos Right, Lateral Calcaneus] [25:No Photos Right, Lateral Lower Leg] Wound Location: [14:Gradually Appeared] [23:Pressure Injury] [25:Gradually Appeared] Wounding Event: [14:Diabetic Wound/Ulcer of the Lower] [23:Diabetic Wound/Ulcer of the Lower] [25:Venous Leg Ulcer] Primary Etiology: [14:Extremity N/A] [23:Extremity Pressure Ulcer] [25:N/A] Secondary Etiology: [14:Anemia, Sleep Apnea, Arrhythmia,] [23:N/A] [25:Anemia, Sleep Apnea, Arrhythmia,] Comorbid History: [14:Congestive Heart Failure, Coronary Artery Disease, Hypertension, Myocardial Infarction,  Dry Gauze 6. Support Layer Applied Kerlix/Coban Wound #25 (Right, Lateral Lower Leg) 1. Cleanse With Soap and water 2. Periwound Care Barrier cream Moisturizing lotion TCA Cream 3. Primary Dressing Applied Polymem Ag 4. Secondary Dressing Dry Gauze 6. Support Layer Applied Kerlix/Coban Wound #26 (Left, Dorsal Foot) 1. Cleanse With Soap and water 2. Periwound Care Barrier cream Moisturizing lotion TCA Cream 3. Primary Dressing Applied Polymem Ag 4. Secondary Dressing Dry Gauze 6. Support Layer Holiday representative) Signed: 02/16/2020 4:47:47 PM By: Linton Ham MD Signed: 02/18/2020 5:24:43 PM By: Deon Pilling Entered By: Linton Ham on  02/16/2020 11:42:22 -------------------------------------------------------------------------------- Multi-Disciplinary Care Plan Details Patient Name: Date of Service: [name] 02/16/2020 9:30 A M Medical Record Number: [id number] Patient Account Number: [id number] Date of Birth/Sex: Treating RN: 06/12/1945 (74 y.o. [name] Primary Care Georgetta Crafton: Other Clinician: [name], [name] Referring Iretta Mangrum: Treating Mohsin Crum/Extender: [name], [name] [name], [name] Weeks in Treatment: 200 Active Inactive Wound/Skin Impairment Nursing Diagnoses: Impaired tissue integrity Goals: Patient/caregiver will verbalize understanding of skin care regimen Date Initiated: 11/19/2017 Target Resolution Date: 04/16/2020 Goal Status: Active Ulcer/skin breakdown will have a volume reduction of 30% by week 4 Date Initiated: 11/19/2017 Date Inactivated: 12/02/2017 Target Resolution Date: 12/22/2017 Goal Status: Unmet Unmet Reason: multipl comorbidities Interventions: Assess patient/caregiver ability to obtain necessary supplies Assess ulceration(s) every visit Provide education on ulcer and skin care Notes: Electronic Signature(s) Signed: 02/18/2020 5:24:43 PM By: Deon Pilling Entered By: Deon Pilling on 02/16/2020 12:05:36 -------------------------------------------------------------------------------- Pain Assessment Details Patient Name: Date of Service: [name] [facility] F. 02/16/2020 9:30 A M Medical Record Number: [id number] Patient Account Number: [id number] Date of Birth/Sex: Treating RN: [birth year] (74 y.o. [name] Primary Care Lateria Alderman: [name], [name] Other Clinician: Referring Carmita Boom: Treating Joffre Lucks/Extender: [name], [name] [name], [name] Weeks in Treatment: 200 Active Problems Location of Pain Severity and Description of Pain Patient Has Paino No Site Locations Rate the pain. Current Pain Level: 0 Pain Management and Medication Current Pain  Management: Medication: No Cold Application: No Rest: No Massage: No Activity: No T.E.N.S.: No Heat Application: No Leg drop or elevation: No Is the Current Pain Management Adequate: Adequate How does your wound impact your activities of daily livingo Sleep: No Bathing: No Appetite: No Relationship With Others: No Bladder Continence: No Emotions: No Bowel Continence: No Work: No Toileting: No Drive: No Dressing: No Hobbies: No Electronic Signature(s) Signed: 02/18/2020 5:24:43 PM By: Deon Pilling Entered By: Deon Pilling on 02/16/2020 09:17:13 -------------------------------------------------------------------------------- Patient/Caregiver Education Details Patient Name: Date of Service: [name] 10/26/2021andnbsp9:30 A M Medical Record Number: [id number] Patient Account Number: [id number] Date of Birth/Gender: Treating RN: October 17, 1945 (74 y.o. [name] Primary Care Physician: [name], [name] Other Clinician: Referring Physician: Treating Physician/Extender: [name], [name] [name], [name] Weeks in Treatment: 200 Education Assessment Education Provided To: Patient Education Topics Provided Wound/Skin Impairment: Handouts: Skin Care Do's and Dont's Methods: Explain/Verbal Responses: Reinforcements needed Electronic Signature(s) Signed: 02/18/2020 5:24:43 PM By: Deon Pilling Entered By: Deon Pilling on 02/16/2020 12:05:52 -------------------------------------------------------------------------------- Wound Assessment Details Patient Name: Date of Service: [name] 02/16/2020 9:30 A M Medical Record Number: [id number] Patient Account Number: [id number] Date of Birth/Sex: Treating RN: [birth year] (74 y.o. [name] Primary Care Shannara Winbush: [name], [name] Other Clinician: Referring Tino Ronan: Treating Pennie Vanblarcom/Extender: [name], [name] [name], [name] Weeks in Treatment: 200 Wound Status Wound Number: 14 Primary Diabetic  Wound/Ulcer of the Lower Extremity Etiology: Wound Location: Left, Lateral Lower Leg Wound Open Wounding Event: Gradually Appeared Status:  Dry Gauze 6. Support Layer Applied Kerlix/Coban Wound #25 (Right, Lateral Lower Leg) 1. Cleanse With Soap and water 2. Periwound Care Barrier cream Moisturizing lotion TCA Cream 3. Primary Dressing Applied Polymem Ag 4. Secondary Dressing Dry Gauze 6. Support Layer Applied Kerlix/Coban Wound #26 (Left, Dorsal Foot) 1. Cleanse With Soap and water 2. Periwound Care Barrier cream Moisturizing lotion TCA Cream 3. Primary Dressing Applied Polymem Ag 4. Secondary Dressing Dry Gauze 6. Support Layer Holiday representative) Signed: 02/16/2020 4:47:47 PM By: Linton Ham MD Signed: 02/18/2020 5:24:43 PM By: Deon Pilling Entered By: Linton Ham on  02/16/2020 11:42:22 -------------------------------------------------------------------------------- Multi-Disciplinary Care Plan Details Patient Name: Date of Service: [name] 02/16/2020 9:30 A M Medical Record Number: [id number] Patient Account Number: [id number] Date of Birth/Sex: Treating RN: 06/12/1945 (74 y.o. [name] Primary Care Georgetta Crafton: Other Clinician: [name], [name] Referring Iretta Mangrum: Treating Mohsin Crum/Extender: [name], [name] [name], [name] Weeks in Treatment: 200 Active Inactive Wound/Skin Impairment Nursing Diagnoses: Impaired tissue integrity Goals: Patient/caregiver will verbalize understanding of skin care regimen Date Initiated: 11/19/2017 Target Resolution Date: 04/16/2020 Goal Status: Active Ulcer/skin breakdown will have a volume reduction of 30% by week 4 Date Initiated: 11/19/2017 Date Inactivated: 12/02/2017 Target Resolution Date: 12/22/2017 Goal Status: Unmet Unmet Reason: multipl comorbidities Interventions: Assess patient/caregiver ability to obtain necessary supplies Assess ulceration(s) every visit Provide education on ulcer and skin care Notes: Electronic Signature(s) Signed: 02/18/2020 5:24:43 PM By: Deon Pilling Entered By: Deon Pilling on 02/16/2020 12:05:36 -------------------------------------------------------------------------------- Pain Assessment Details Patient Name: Date of Service: [name] [facility] F. 02/16/2020 9:30 A M Medical Record Number: [id number] Patient Account Number: [id number] Date of Birth/Sex: Treating RN: [birth year] (74 y.o. [name] Primary Care Lateria Alderman: [name], [name] Other Clinician: Referring Carmita Boom: Treating Joffre Lucks/Extender: [name], [name] [name], [name] Weeks in Treatment: 200 Active Problems Location of Pain Severity and Description of Pain Patient Has Paino No Site Locations Rate the pain. Current Pain Level: 0 Pain Management and Medication Current Pain  Management: Medication: No Cold Application: No Rest: No Massage: No Activity: No T.E.N.S.: No Heat Application: No Leg drop or elevation: No Is the Current Pain Management Adequate: Adequate How does your wound impact your activities of daily livingo Sleep: No Bathing: No Appetite: No Relationship With Others: No Bladder Continence: No Emotions: No Bowel Continence: No Work: No Toileting: No Drive: No Dressing: No Hobbies: No Electronic Signature(s) Signed: 02/18/2020 5:24:43 PM By: Deon Pilling Entered By: Deon Pilling on 02/16/2020 09:17:13 -------------------------------------------------------------------------------- Patient/Caregiver Education Details Patient Name: Date of Service: [name] 10/26/2021andnbsp9:30 A M Medical Record Number: [id number] Patient Account Number: [id number] Date of Birth/Gender: Treating RN: October 17, 1945 (74 y.o. [name] Primary Care Physician: [name], [name] Other Clinician: Referring Physician: Treating Physician/Extender: [name], [name] [name], [name] Weeks in Treatment: 200 Education Assessment Education Provided To: Patient Education Topics Provided Wound/Skin Impairment: Handouts: Skin Care Do's and Dont's Methods: Explain/Verbal Responses: Reinforcements needed Electronic Signature(s) Signed: 02/18/2020 5:24:43 PM By: Deon Pilling Entered By: Deon Pilling on 02/16/2020 12:05:52 -------------------------------------------------------------------------------- Wound Assessment Details Patient Name: Date of Service: [name] 02/16/2020 9:30 A M Medical Record Number: [id number] Patient Account Number: [id number] Date of Birth/Sex: Treating RN: [birth year] (74 y.o. [name] Primary Care Shannara Winbush: [name], [name] Other Clinician: Referring Tino Ronan: Treating Pennie Vanblarcom/Extender: [name], [name] [name], [name] Weeks in Treatment: 200 Wound Status Wound Number: 14 Primary Diabetic  Wound/Ulcer of the Lower Extremity Etiology: Wound Location: Left, Lateral Lower Leg Wound Open Wounding Event: Gradually Appeared Status:  COSTAS, SENA ([id number]) Visit Report for 02/16/2020 Arrival Information Details Patient Name: Date of Service: [name] [facility] F. 02/16/2020 9:30 A M Medical Record Number: [id number] Patient Account Number: [id number] Date of Birth/Sex: Treating RN: January 20, 1946 (74 y.o. [name], [name] Primary Care Solveig Fangman: [name], [name] Other Clinician: Referring Hidaya Daniel: Treating Charliene Inoue/Extender: [name], [name] [name], [name] Weeks in Treatment: 200 Visit Information History Since Last Visit Added or deleted any medications: No Patient Arrived: Wheel Chair Any new allergies or adverse reactions: No Arrival Time: 09:11 Had a fall or experienced change in No Accompanied By: daughter activities of daily living that may affect Transfer Assistance: None risk of falls: Patient Identification Verified: Yes Signs or symptoms of abuse/neglect since last visito No Secondary Verification Process Completed: Yes Hospitalized since last visit: No Patient Requires Transmission-Based Precautions: No Implantable device outside of the clinic excluding No Patient Has Alerts: Yes cellular tissue based products placed in the center Patient Alerts: Patient on Blood Thinner since last visit: left ABI 1.0; rt ABI 1.09 Has Dressing in Place as Prescribed: Yes Has Compression in Place as Prescribed: Yes Pain Present Now: No Electronic Signature(s) Signed: 02/18/2020 5:24:43 PM By: Deon Pilling Entered By: Deon Pilling on 02/16/2020 09:16:12 -------------------------------------------------------------------------------- Encounter Discharge Information Details Patient Name: Date of Service: [name] 02/16/2020 9:30 A M Medical Record Number: [id number] Patient Account Number: [id number] Date of Birth/Sex: Treating RN: 12/12/1945 (74 y.o. [name] Primary Care Katilyn Miltenberger: [name], [name] Other Clinician: Referring Quinlan Mcfall: Treating Pami Wool/Extender: [name], [name] [name],  [name] Weeks in Treatment: 200 Encounter Discharge Information Items Post Procedure Vitals Discharge Condition: Stable Temperature (F): 97.6 Ambulatory Status: Wheelchair Pulse (bpm): 75 Discharge Destination: Home Respiratory Rate (breaths/min): 20 Transportation: Private Auto Blood Pressure (mmHg): 109/73 Accompanied By: daughter Schedule Follow-up Appointment: Yes Clinical Summary of Care: Patient Declined Electronic Signature(s) Signed: 02/16/2020 5:15:49 PM By: Levan Hurst RN, BSN Entered By: Levan Hurst on 02/16/2020 10:59:25 -------------------------------------------------------------------------------- Lower Extremity Assessment Details Patient Name: Date of Service: [name] 02/16/2020 9:30 A M Medical Record Number: [id number] Patient Account Number: [id number] Date of Birth/Sex: Treating RN: May 13, 1945 (74 y.o. [name] Primary Care Rhylen Pulido: [name], [name] Other Clinician: Referring Arlyn Bumpus: Treating Nakeesha Bowler/Extender: [name], [name] [name], [name] Weeks in Treatment: 200 Edema Assessment Assessed: Shirlyn Goltz: Yes] [Right: Yes] Edema: [Left: No] [Right: No] Calf Left: Right: Point of Measurement: 36 cm From Medial Instep 22 cm 23 cm Ankle Left: Right: Point of Measurement: 11 cm From Medial Instep 17.8 cm 18 cm Electronic Signature(s) Signed: 02/18/2020 5:24:43 PM By: Deon Pilling Entered By: Deon Pilling on 02/16/2020 09:28:58 -------------------------------------------------------------------------------- Multi Wound Chart Details Patient Name: Date of Service: [name] 02/16/2020 9:30 A M Medical Record Number: [id number] Patient Account Number: [id number] Date of Birth/Sex: Treating RN: [birth year] (74 y.o. [name] Primary Care Corlene Sabia: [name], [name] Other Clinician: Referring Eliora Nienhuis: Treating Bernard Donahoo/Extender: [name], [name] [name], [name] Weeks in Treatment: 200 Vital Signs Height(in):  74 Capillary Blood Glucose(mg/dl): 112 Weight(lbs): 150 Pulse(bpm): 75 Body Mass Index(BMI): 19 Blood Pressure(mmHg): 109/73 Temperature(F): 97.6 Respiratory Rate(breaths/min): 20 Photos: [14:No Photos Left, Lateral Lower Leg] [23:No Photos Right, Lateral Calcaneus] [25:No Photos Right, Lateral Lower Leg] Wound Location: [14:Gradually Appeared] [23:Pressure Injury] [25:Gradually Appeared] Wounding Event: [14:Diabetic Wound/Ulcer of the Lower] [23:Diabetic Wound/Ulcer of the Lower] [25:Venous Leg Ulcer] Primary Etiology: [14:Extremity N/A] [23:Extremity Pressure Ulcer] [25:N/A] Secondary Etiology: [14:Anemia, Sleep Apnea, Arrhythmia,] [23:N/A] [25:Anemia, Sleep Apnea, Arrhythmia,] Comorbid History: [14:Congestive Heart Failure, Coronary Artery Disease, Hypertension, Myocardial Infarction,  COSTAS, SENA ([id number]) Visit Report for 02/16/2020 Arrival Information Details Patient Name: Date of Service: [name] [facility] F. 02/16/2020 9:30 A M Medical Record Number: [id number] Patient Account Number: [id number] Date of Birth/Sex: Treating RN: January 20, 1946 (74 y.o. [name], [name] Primary Care Solveig Fangman: [name], [name] Other Clinician: Referring Hidaya Daniel: Treating Charliene Inoue/Extender: [name], [name] [name], [name] Weeks in Treatment: 200 Visit Information History Since Last Visit Added or deleted any medications: No Patient Arrived: Wheel Chair Any new allergies or adverse reactions: No Arrival Time: 09:11 Had a fall or experienced change in No Accompanied By: daughter activities of daily living that may affect Transfer Assistance: None risk of falls: Patient Identification Verified: Yes Signs or symptoms of abuse/neglect since last visito No Secondary Verification Process Completed: Yes Hospitalized since last visit: No Patient Requires Transmission-Based Precautions: No Implantable device outside of the clinic excluding No Patient Has Alerts: Yes cellular tissue based products placed in the center Patient Alerts: Patient on Blood Thinner since last visit: left ABI 1.0; rt ABI 1.09 Has Dressing in Place as Prescribed: Yes Has Compression in Place as Prescribed: Yes Pain Present Now: No Electronic Signature(s) Signed: 02/18/2020 5:24:43 PM By: Deon Pilling Entered By: Deon Pilling on 02/16/2020 09:16:12 -------------------------------------------------------------------------------- Encounter Discharge Information Details Patient Name: Date of Service: [name] 02/16/2020 9:30 A M Medical Record Number: [id number] Patient Account Number: [id number] Date of Birth/Sex: Treating RN: 12/12/1945 (74 y.o. [name] Primary Care Katilyn Miltenberger: [name], [name] Other Clinician: Referring Quinlan Mcfall: Treating Pami Wool/Extender: [name], [name] [name],  [name] Weeks in Treatment: 200 Encounter Discharge Information Items Post Procedure Vitals Discharge Condition: Stable Temperature (F): 97.6 Ambulatory Status: Wheelchair Pulse (bpm): 75 Discharge Destination: Home Respiratory Rate (breaths/min): 20 Transportation: Private Auto Blood Pressure (mmHg): 109/73 Accompanied By: daughter Schedule Follow-up Appointment: Yes Clinical Summary of Care: Patient Declined Electronic Signature(s) Signed: 02/16/2020 5:15:49 PM By: Levan Hurst RN, BSN Entered By: Levan Hurst on 02/16/2020 10:59:25 -------------------------------------------------------------------------------- Lower Extremity Assessment Details Patient Name: Date of Service: [name] 02/16/2020 9:30 A M Medical Record Number: [id number] Patient Account Number: [id number] Date of Birth/Sex: Treating RN: May 13, 1945 (74 y.o. [name] Primary Care Rhylen Pulido: [name], [name] Other Clinician: Referring Arlyn Bumpus: Treating Nakeesha Bowler/Extender: [name], [name] [name], [name] Weeks in Treatment: 200 Edema Assessment Assessed: Shirlyn Goltz: Yes] [Right: Yes] Edema: [Left: No] [Right: No] Calf Left: Right: Point of Measurement: 36 cm From Medial Instep 22 cm 23 cm Ankle Left: Right: Point of Measurement: 11 cm From Medial Instep 17.8 cm 18 cm Electronic Signature(s) Signed: 02/18/2020 5:24:43 PM By: Deon Pilling Entered By: Deon Pilling on 02/16/2020 09:28:58 -------------------------------------------------------------------------------- Multi Wound Chart Details Patient Name: Date of Service: [name] 02/16/2020 9:30 A M Medical Record Number: [id number] Patient Account Number: [id number] Date of Birth/Sex: Treating RN: [birth year] (74 y.o. [name] Primary Care Corlene Sabia: [name], [name] Other Clinician: Referring Eliora Nienhuis: Treating Bernard Donahoo/Extender: [name], [name] [name], [name] Weeks in Treatment: 200 Vital Signs Height(in):  74 Capillary Blood Glucose(mg/dl): 112 Weight(lbs): 150 Pulse(bpm): 75 Body Mass Index(BMI): 19 Blood Pressure(mmHg): 109/73 Temperature(F): 97.6 Respiratory Rate(breaths/min): 20 Photos: [14:No Photos Left, Lateral Lower Leg] [23:No Photos Right, Lateral Calcaneus] [25:No Photos Right, Lateral Lower Leg] Wound Location: [14:Gradually Appeared] [23:Pressure Injury] [25:Gradually Appeared] Wounding Event: [14:Diabetic Wound/Ulcer of the Lower] [23:Diabetic Wound/Ulcer of the Lower] [25:Venous Leg Ulcer] Primary Etiology: [14:Extremity N/A] [23:Extremity Pressure Ulcer] [25:N/A] Secondary Etiology: [14:Anemia, Sleep Apnea, Arrhythmia,] [23:N/A] [25:Anemia, Sleep Apnea, Arrhythmia,] Comorbid History: [14:Congestive Heart Failure, Coronary Artery Disease, Hypertension, Myocardial Infarction,

## 2020-02-28 NOTE — Progress Notes (Deleted)
HPI:   [name] is a 74 y.o. male, who is here today for *** months follow up.   *** was last seen on ***  DM II: He is on Lantus 35 U daily and Novalog 10 U TID before meals. *** Lab Results  Component Value Date   HGBA1C 12.6 (A) 06/24/2019   Lab Results  Component Value Date   MICROALBUR 2.9 (H) 09/10/2017   MICROALBUR 9.3 (H) 04/09/2017   HTN: He is on Losartan 25 mg daily and Carvedilol 6.25 mg bid.  Lab Results  Component Value Date   CREATININE 1.90 (H) 06/15/2019   BUN 31 (H) 06/15/2019   NA 140 06/15/2019   K 5.1 06/15/2019   CL 104 06/15/2019   CO2 25 06/15/2019   HLD: He is on Crestor 40 mg daily. {4+ HPI elements (or status of 3 or more chronic diseases)} ***  Lab Results  Component Value Date   CHOL 120 03/09/2014   HDL 39 (L) 03/09/2014   LDLCALC 56 03/09/2014   LDLDIRECT 100.5 02/08/2012   TRIG 123 03/09/2014   CHOLHDL 3.1 03/09/2014   CAD: On Plavix 75 mg daily.  Depression: He is on Mirtazapine 15 mg at bedtime. ***  Elevated alk phosphatase and AST: *** Lab Results  Component Value Date   ALT 28 06/15/2019   AST 44 (H) 06/15/2019   ALKPHOS 149 (H) 06/15/2019   BILITOT 0.9 06/15/2019    Review of Systems Rest of ROS, see pertinent positives sand negatives in HPI [review of 2 to 9 systems] ***  Current Outpatient Medications on File Prior to Visit  Medication Sig Dispense Refill  . Alcohol Swabs (B-D SINGLE USE SWABS REGULAR) PADS USE TO TEST BLOOD SUGAR DAILY 100 each 3  . amiodarone (PACERONE) 200 MG tablet TAKE 1 TABLET EVERY DAY 90 tablet 3  . Ascorbic Acid (VITAMIN C PO) Take 1 tablet by mouth daily. 1061m    . aspirin EC 81 MG tablet Take 81 mg by mouth daily.    . Blood Glucose Calibration (TRUE METRIX LEVEL 1) Low SOLN USE TO TEST BLOOD SUGAR DAILY 1 each 3  . Blood Glucose Monitoring Suppl (TRUE METRIX AIR GLUCOSE METER) w/Device KIT 1 Device by Does not apply route daily. 1 kit 1  . Blood Pressure  Monitoring (BLOOD PRESSURE MONITOR AUTOMAT) DEVI To check BP daily 1 Device 0  . carvedilol (COREG) 3.125 MG tablet TAKE 1 TABLET TWICE DAILY WITH MEALS 180 tablet 2  . clopidogrel (PLAVIX) 75 MG tablet TAKE 1 TABLET EVERY DAY 90 tablet 3  . clotrimazole-betamethasone (LOTRISONE) cream Apply 1 application topically daily as needed. mall amount. (Patient not taking: Reported on 08/19/2019) 30 g 1  . finasteride (PROSCAR) 5 MG tablet TAKE 1 TABLET EVERY DAY 90 tablet 1  . HYDROcodone-acetaminophen (NORCO/VICODIN) 5-325 MG tablet Take 1 tablet by mouth every 6 (six) hours as needed for moderate pain.    .Marland Kitcheninsulin aspart (NOVOLOG) 100 UNIT/ML injection Inject 10 Units into the skin 3 (three) times daily with meals. You can add extra insulin for sugars over 150 to the mealtime dose based on the sugar before the meal: Add 1 unit of NovoLog if your sugar is 150-200 Add 3 units of NovoLog if your sugar is 201-250 Add 5 units of NovoLog if your sugar is 251-300 Add 7 units of NovoLog if your sugar is over 301 10 mL 11  . insulin glargine (LANTUS SOLOSTAR) 100 UNIT/ML Solostar  Pen Inject 35 Units into the skin at bedtime. 15 mL 4  . losartan (COZAAR) 25 MG tablet TAKE 1/2 TABLET TWICE DAILY 90 tablet 2  . MAGNESIUM PO Take 400 mg by mouth every other day.     . mirtazapine (REMERON) 15 MG tablet TAKE 1 TABLET (15 MG TOTAL) BY MOUTH AT BEDTIME. 90 tablet 2  . multivitamin-iron-minerals-folic acid (THERAPEUTIC-M) TABS tablet Take 1 tablet by mouth daily. Gummies- one a day MEN    . nystatin (MYCOSTATIN/NYSTOP) powder Apply topically 3 (three) times daily. (Patient taking differently: Apply topically as needed. ) 60 g 2  . Probiotic Product (PROBIOTIC PO) Take 1 capsule by mouth daily.    . rosuvastatin (CRESTOR) 40 MG tablet TAKE 1 TABLET EVERY DAY 90 tablet 3  . SANTYL ointment     . tamsulosin (FLOMAX) 0.4 MG CAPS capsule TAKE 1 CAPSULE EVERY DAY 90 capsule 3  . torsemide (DEMADEX) 20 MG tablet TAKE 1 TABLET  EVERY OTHER DAY 45 tablet 1  . TRUE METRIX BLOOD GLUCOSE TEST test strip USE  AS  INSTRUCTED 100 strip 3  . TRUEplus Lancets 30G MISC USE  TO  TEST BLOOD SUGAR EVERY DAY 100 each 5  . vitamin A 10000 UNIT capsule Take 10,000 Units by mouth daily.    Marland Kitchen zinc sulfate 220 (50 Zn) MG capsule Take 1,000 mg by mouth daily.      No current facility-administered medications on file prior to visit.     Past Medical History:  Diagnosis Date  . Automatic implantable cardioverter-defibrillator in situ   . CAD (coronary artery disease)    a. LHC (08/2005):  Ostial LAD 99%, mid LAD 50%, superior D1 80%, inferior D1 75%, mid OM proximal 80%, proximal PDA 25-30%, mid RCA 99%.   MRI with full thickness scar.  Med Rx.  2015 demonstrated similar diffuse three-vessel coronary disease. Perfusion imaging with rest we distribution demonstrated no viability in the area of the LAD and circumflex. Medical management.  . CAD, multiple vessel, RCA 100% mid occl.; LAD 99% stenosisprox.  mid of 50-60%; LCX OM-1 90%, OM-2 99%,AV groove 80%  12/10/2013  . CHF (congestive heart failure) ([place])   . Chronic combined systolic and diastolic heart failure ([place])   . CKD (chronic kidney disease)   . Coronary artery disease   . Gout   . Hx of echocardiogram 2015   Echo (08/2013):  EF 10-15%, diff HK, mod LAE, severe RVE, mod reduced RVSF, mild RAE, PASP 41 mmHg.  Marland Kitchen Hypertension   . Ischemic cardiomyopathy    Echo (8/11):  EF 40-45%;  Echo (5/15):  EF 10-15%  . Myocardial infarction [facility]) 2007   out of hosp MI - LHC with 3v CAD rx medically  . NSVT (nonsustained ventricular tachycardia) ([facility])    s/p ICD  . Peripheral neuropathy   . Sleep apnea    "has CPAP; won't use it"  . Stroke [facility]) ~ 2013   "right leg weak since" (12/09/2013)  . Type II diabetes mellitus ([place]) dx'd 2005   Allergies  Allergen Reactions  . Sulfa Antibiotics Rash    Social History   Socioeconomic History  . Marital status: Married    Spouse name:  Not on file  . Number of children: Not on file  . Years of education: Not on file  . Highest education level: Not on file  Occupational History  . Occupation: retired  Tobacco Use  . Smoking status: Never Smoker  . Smokeless tobacco:  Never Used  Vaping Use  . Vaping Use: Never used  Substance and Sexual Activity  . Alcohol use: No  . Drug use: No  . Sexual activity: Not on file  Other Topics Concern  . Not on file  Social History Narrative   ** Merged History Encounter **       He is retired Veterinary surgeon for [employer].  He took early retirement at 74 years old due to medical reasons. He lives with wife. Highest level of education:  High school    Social Determinants of Health   Financial Resource Strain: Low Risk   . Difficulty of Paying Living Expenses: Not hard at all  Food Insecurity:   . Worried About Charity fundraiser in the Last Year: Not on file  . Ran Out of Food in the Last Year: Not on file  Transportation Needs: No Transportation Needs  . Lack of Transportation (Medical): No  . Lack of Transportation (Non-Medical): No  Physical Activity:   . Days of Exercise per Week: Not on file  . Minutes of Exercise per Session: Not on file  Stress:   . Feeling of Stress : Not on file  Social Connections:   . Frequency of Communication with Friends and Family: Not on file  . Frequency of Social Gatherings with Friends and Family: Not on file  . Attends Religious Services: Not on file  . Active Member of Clubs or Organizations: Not on file  . Attends Archivist Meetings: Not on file  . Marital Status: Not on file    There were no vitals filed for this visit. There is no height or weight on file to calculate BMI.   Physical Exam  {[12+ exam elements]} ***  ASSESSMENT AND PLAN:   [name] was seen today for *** months follow-up.  No orders of the defined types were placed in this encounter.   No problem-specific  Assessment & Plan notes found for this encounter.    No follow-ups on file.        -[name] was advised to return sooner than planned today if new concerns arise.       Orianna Biskup G. [name], MD  [facility]. [place] office.

## 2020-02-29 ENCOUNTER — Ambulatory Visit: Payer: Medicare HMO | Admitting: Family Medicine

## 2020-02-29 DIAGNOSIS — Z1159 Encounter for screening for other viral diseases: Secondary | ICD-10-CM

## 2020-02-29 DIAGNOSIS — N1831 Chronic kidney disease, stage 3a: Secondary | ICD-10-CM

## 2020-02-29 DIAGNOSIS — R945 Abnormal results of liver function studies: Secondary | ICD-10-CM

## 2020-02-29 DIAGNOSIS — E1165 Type 2 diabetes mellitus with hyperglycemia: Secondary | ICD-10-CM

## 2020-02-29 DIAGNOSIS — E785 Hyperlipidemia, unspecified: Secondary | ICD-10-CM

## 2020-02-29 DIAGNOSIS — I130 Hypertensive heart and chronic kidney disease with heart failure and stage 1 through stage 4 chronic kidney disease, or unspecified chronic kidney disease: Secondary | ICD-10-CM

## 2020-03-01 DIAGNOSIS — N312 Flaccid neuropathic bladder, not elsewhere classified: Secondary | ICD-10-CM | POA: Diagnosis not present

## 2020-03-11 ENCOUNTER — Other Ambulatory Visit: Payer: Self-pay | Admitting: Family Medicine

## 2020-03-15 ENCOUNTER — Encounter (HOSPITAL_BASED_OUTPATIENT_CLINIC_OR_DEPARTMENT_OTHER): Payer: Medicare HMO | Admitting: Internal Medicine

## 2020-03-22 ENCOUNTER — Encounter (HOSPITAL_BASED_OUTPATIENT_CLINIC_OR_DEPARTMENT_OTHER): Payer: Medicare HMO | Admitting: Internal Medicine

## 2020-03-29 DIAGNOSIS — N312 Flaccid neuropathic bladder, not elsewhere classified: Secondary | ICD-10-CM | POA: Diagnosis not present

## 2020-04-06 ENCOUNTER — Other Ambulatory Visit (HOSPITAL_COMMUNITY): Payer: Self-pay | Admitting: Internal Medicine

## 2020-04-06 ENCOUNTER — Other Ambulatory Visit: Payer: Self-pay | Admitting: Family Medicine

## 2020-04-06 DIAGNOSIS — I130 Hypertensive heart and chronic kidney disease with heart failure and stage 1 through stage 4 chronic kidney disease, or unspecified chronic kidney disease: Secondary | ICD-10-CM

## 2020-04-26 ENCOUNTER — Encounter (HOSPITAL_BASED_OUTPATIENT_CLINIC_OR_DEPARTMENT_OTHER): Payer: Medicare HMO | Attending: Internal Medicine | Admitting: Internal Medicine

## 2020-04-26 ENCOUNTER — Other Ambulatory Visit: Payer: Self-pay

## 2020-04-26 DIAGNOSIS — L97412 Non-pressure chronic ulcer of right heel and midfoot with fat layer exposed: Secondary | ICD-10-CM | POA: Diagnosis not present

## 2020-04-26 DIAGNOSIS — I255 Ischemic cardiomyopathy: Secondary | ICD-10-CM | POA: Diagnosis not present

## 2020-04-26 DIAGNOSIS — E11622 Type 2 diabetes mellitus with other skin ulcer: Secondary | ICD-10-CM | POA: Insufficient documentation

## 2020-04-26 DIAGNOSIS — Z8673 Personal history of transient ischemic attack (TIA), and cerebral infarction without residual deficits: Secondary | ICD-10-CM | POA: Insufficient documentation

## 2020-04-26 DIAGNOSIS — I252 Old myocardial infarction: Secondary | ICD-10-CM | POA: Diagnosis not present

## 2020-04-26 DIAGNOSIS — L97812 Non-pressure chronic ulcer of other part of right lower leg with fat layer exposed: Secondary | ICD-10-CM | POA: Insufficient documentation

## 2020-04-26 DIAGNOSIS — I130 Hypertensive heart and chronic kidney disease with heart failure and stage 1 through stage 4 chronic kidney disease, or unspecified chronic kidney disease: Secondary | ICD-10-CM | POA: Insufficient documentation

## 2020-04-26 DIAGNOSIS — L97522 Non-pressure chronic ulcer of other part of left foot with fat layer exposed: Secondary | ICD-10-CM | POA: Diagnosis not present

## 2020-04-26 DIAGNOSIS — E1122 Type 2 diabetes mellitus with diabetic chronic kidney disease: Secondary | ICD-10-CM | POA: Insufficient documentation

## 2020-04-26 DIAGNOSIS — E1142 Type 2 diabetes mellitus with diabetic polyneuropathy: Secondary | ICD-10-CM | POA: Diagnosis not present

## 2020-04-26 DIAGNOSIS — L97822 Non-pressure chronic ulcer of other part of left lower leg with fat layer exposed: Secondary | ICD-10-CM | POA: Insufficient documentation

## 2020-04-26 DIAGNOSIS — N183 Chronic kidney disease, stage 3 unspecified: Secondary | ICD-10-CM | POA: Insufficient documentation

## 2020-04-26 DIAGNOSIS — I2510 Atherosclerotic heart disease of native coronary artery without angina pectoris: Secondary | ICD-10-CM | POA: Diagnosis not present

## 2020-04-26 DIAGNOSIS — L97512 Non-pressure chronic ulcer of other part of right foot with fat layer exposed: Secondary | ICD-10-CM | POA: Insufficient documentation

## 2020-04-26 DIAGNOSIS — E11621 Type 2 diabetes mellitus with foot ulcer: Secondary | ICD-10-CM | POA: Insufficient documentation

## 2020-04-26 DIAGNOSIS — L89613 Pressure ulcer of right heel, stage 3: Secondary | ICD-10-CM | POA: Diagnosis not present

## 2020-04-26 DIAGNOSIS — I5042 Chronic combined systolic (congestive) and diastolic (congestive) heart failure: Secondary | ICD-10-CM | POA: Diagnosis not present

## 2020-04-26 NOTE — Progress Notes (Signed)
No Type II Diabetes, Gout, Neuropathy Wound Measurements Length: (cm) 1.4 Width: (cm) 1.3 Depth: (cm) 0.1 Area: (cm) 1.429 Volume: (cm) 0.143 % Reduction in Area: % Reduction in Volume: Epithelialization: Small (1-33%) Tunneling: No Undermining: No Wound Description Classification: Full Thickness Without Exposed Support Str Wound Margin: Flat and Intact Exudate Amount: Medium Exudate Type: Serosanguineous Exudate Color: red, brown uctures Wound Bed Granulation Amount: Large (67-100%) Exposed Structure Granulation Quality: Red Fascia Exposed: No Necrotic Amount: None Present (0%) Fat Layer (Subcutaneous Tissue) Exposed: Yes Tendon Exposed: No Muscle  Exposed: No Joint Exposed: No Bone Exposed: No Treatment Notes Wound #31 (Knee) Wound Laterality: Right Cleanser Soap and Water Discharge Instruction: May shower and wash wound with dial antibacterial soap and water prior to dressing change. Peri-Wound Care Sween Lotion (Moisturizing lotion) Discharge Instruction: Apply moisturizing lotion as directed Topical Primary Dressing KerraCel Ag Gelling Fiber Dressing, 4x5 in (silver alginate) Discharge Instruction: Apply silver alginate to wound bed as instructed Secondary Dressing Woven Gauze Sponge, Non-Sterile 4x4 in Discharge Instruction: Apply over primary dressing as directed. ABD Pad, 5x9 Discharge Instruction: Apply over primary dressing as directed. Secured With [employer] 4x5 (in/yd) Discharge Instruction: Secure with Coban as directed. Kerlix Roll Sterile, 4.5x3.1 (in/yd) Discharge Instruction: Secure with Kerlix as directed. Compression Wrap Compression Stockings Add-Ons Electronic Signature(s) Signed: 04/26/2020 5:39:46 PM By: Baruch Gouty RN, BSN Entered By: Baruch Gouty on 04/26/2020 09:35:42 -------------------------------------------------------------------------------- [name] [name] Details Patient Name: Date of Service: [name] Foley F. 04/26/2020 9:30 A M Medical Record Number: [id number] Patient Account Number: [id number] Date of Birth/Sex: Treating RN: 1945-06-01 (75 y.o. [name] [name] Primary Care Markeeta Scalf: [name], [name] Other Clinician: Referring Saige Canton: Treating Jaylia Pettus/Extender: [name], [name] [name], [name] Weeks in Treatment: 210 Vital Signs Time Taken: 09:14 Temperature (F): 98.1 Height (in): 74 Pulse (bpm): 76 Source: Stated Respiratory Rate (breaths/min): 18 Weight (lbs): 150 Blood Pressure (mmHg): 118/74 Source: Stated Capillary Blood Glucose (mg/dl): 117 Body Mass Index (BMI): 19.3 Reference Range: 80 - 120 mg / dl Notes glucose per pt report this  am Electronic Signature(s) Signed: 04/26/2020 5:39:46 PM By: Baruch Gouty RN, BSN Entered By: Baruch Gouty on 04/26/2020 09:16:42  Jul 23, 1945 (75 y.o. [name] [name] Primary Care Sunday Klos: [name], [name] Other Clinician: Referring Anuj Summons: Treating Letica Giaimo/Extender: [name], [name] [name], [name] Weeks in Treatment: 210 Active Problems Location of Pain Severity and Description of Pain Patient Has Paino No Site Locations Rate the pain. Current Pain Level: 0 Pain Management and Medication Current Pain Management: Electronic Signature(s) Signed: 04/26/2020 5:39:46 PM By: Baruch Gouty RN, BSN Entered By: Baruch Gouty on 04/26/2020 09:16:53 -------------------------------------------------------------------------------- Patient/Caregiver Education Details Patient Name: Date of Service: [name] [name] 1/4/2022andnbsp9:30 A M Medical Record Number: [id number] Patient Account Number: [id number] Date of Birth/Gender: Treating RN: 23-Mar-1946 (75 y.o. [name] [name] Primary Care Physician: [name], [name] Other Clinician: Referring Physician: Treating Physician/Extender: [name], [name] [name], [name] Weeks in Treatment: 210 Education Assessment Education Provided To: Patient Education Topics Provided Wound/Skin Impairment: Handouts: Caring for Your Ulcer, Skin Care Do's and Dont's Methods: Explain/Verbal Responses: State content correctly Electronic Signature(s) Signed: 04/26/2020 5:38:12 PM By: [name] Entered By: [name] on 04/26/2020 09:16:46 -------------------------------------------------------------------------------- Wound Assessment Details Patient Name: Date of Service: [name] Foley F. 04/26/2020 9:30 A M Medical Record Number: [id number] Patient Account Number: [id number] Date of Birth/Sex: Treating RN: Jan 29, 1946 (75 y.o. [name] [name] Primary Care Skipper Dacosta: [name], [name] Other Clinician: Referring Andre Swander: Treating Moriah Shawley/Extender: [name], [name] [name], [name] Weeks in Treatment: 210 Wound  Status Wound Number: 14 Primary Diabetic Wound/Ulcer of the Lower Extremity Etiology: Wound Location: Left, Lateral Lower Leg Wound Open Wounding Event: Gradually Appeared Status: Date Acquired: 08/15/2017 Comorbid Anemia, Sleep Apnea, Arrhythmia, Congestive Heart Failure, Weeks Of Treatment: 140 History: Coronary Artery Disease, Hypertension, Myocardial Infarction, Clustered Wound: Yes Type II Diabetes, Gout, Neuropathy Wound Measurements Length: (cm) 8.4 Width: (cm) 1.2 Depth: (cm) 0.2 Clustered Quantity: 2 Area: (cm) 7.917 Volume: (cm) 1.583 Wound Description Classification: Grade 2 Wound Margin: Flat and Intact Exudate Amount: Medium Exudate Type: Serosanguineous Exudate Color: red, brown Foul Odor After Cleansing: No Slough/Fibrino Yes % Reduction in Area: -740.4% % Reduction in Volume: -1584% Epithelialization: Small (1-33%) Tunneling: No Undermining: No Wound Bed Granulation Amount: Medium (34-66%) Exposed Structure Granulation Quality: Red, Pink, Friable Fascia Exposed: No Necrotic Amount: Medium (34-66%) Fat Layer (Subcutaneous Tissue) Exposed: Yes Necrotic Quality: Eschar, Adherent Slough Tendon Exposed: No Muscle Exposed: No Joint Exposed: No Bone Exposed: No Treatment Notes Wound #14 (Lower Leg) Wound Laterality: Left, Lateral Cleanser Soap and Water Discharge Instruction: May shower and wash wound with dial antibacterial soap and water prior to dressing change. Peri-Wound Care Sween Lotion (Moisturizing lotion) Discharge Instruction: Apply moisturizing lotion as directed Topical Primary Dressing KerraCel Ag Gelling Fiber Dressing, 4x5 in (silver alginate) Discharge Instruction: Apply silver alginate to wound bed as instructed Secondary Dressing Woven Gauze Sponge, Non-Sterile 4x4 in Discharge Instruction: Apply over primary dressing as directed. ABD Pad, 5x9 Discharge Instruction: Apply over primary dressing as directed. Secured With [employer] 4x5 (in/yd) Discharge Instruction: Secure with Coban as directed. Kerlix Roll Sterile, 4.5x3.1 (in/yd) Discharge Instruction: Secure with Kerlix as directed. Compression Wrap Compression Stockings Add-Ons Electronic Signature(s) Signed: 04/26/2020 5:39:46 PM By: Baruch Gouty RN, BSN Entered By: Baruch Gouty on 04/26/2020 09:37:10 -------------------------------------------------------------------------------- Wound Assessment Details Patient Name: Date of Service: [name] Foley F. 04/26/2020 9:30 A M Medical Record Number: [id number] Patient Account Number: [id number] Date of Birth/Sex: Treating RN: 11-Apr-1946 (75 y.o. [name] [name] Primary Care Giovanie Lefebre: [name], [name] Other Clinician: Referring Lakai Moree: Treating Jaquelyn Sakamoto/Extender: [name], [name] [name], [name] Weeks in Treatment: 210 Wound Status Wound Number: 25 Primary Venous Leg Ulcer  SYLER, NORCIA ([id number]) Visit Report for 04/26/2020 Arrival Information Details Patient Name: Date of Service: [name] [facility] F. 04/26/2020 9:30 A M Medical Record Number: [id number] Patient Account Number: [id number] Date of Birth/Sex: Treating RN: 1945-11-03 (75 y.o. [name] [name] Primary Care Marce Charlesworth: [name], [name] Other Clinician: Referring Gunnar Hereford: Treating Anner Baity/Extender: [name], [name] [name], [name] Weeks in Treatment: 210 Visit Information History Since Last Visit Added or deleted any medications: No Patient Arrived: Wheel Chair Any new allergies or adverse reactions: No Arrival Time: 09:13 Had a fall or experienced change in No Accompanied By: dgt activities of daily living that may affect Transfer Assistance: None risk of falls: Patient Identification Verified: Yes Signs or symptoms of abuse/neglect since last visito No Secondary Verification Process Completed: Yes Hospitalized since last visit: No Patient Requires Transmission-Based Precautions: No Implantable device outside of the clinic excluding No Patient Has Alerts: Yes cellular tissue based products placed in the center Patient Alerts: Patient on Blood Thinner since last visit: left ABI 1.0; rt ABI 1.09 Has Dressing in Place as Prescribed: Yes Has Compression in Place as Prescribed: No Pain Present Now: No Electronic Signature(s) Signed: 04/26/2020 5:39:46 PM By: Baruch Gouty RN, BSN Entered By: Baruch Gouty on 04/26/2020 09:14:29 -------------------------------------------------------------------------------- Encounter Discharge Information Details Patient Name: Date of Service: [name] Foley F. 04/26/2020 9:30 A M Medical Record Number: [id number] Patient Account Number: [id number] Date of Birth/Sex: Treating RN: 10-04-1945 (75 y.o. [name] Primary Care Holman Bonsignore: [name], [name] Other Clinician: Referring Saleh Ulbrich: Treating Kelton Bultman/Extender: [name], [name] [name],  [name] Weeks in Treatment: 210 Encounter Discharge Information Items Post Procedure Vitals Discharge Condition: Stable Temperature (F): 98.1 Ambulatory Status: Wheelchair Pulse (bpm): 76 Discharge Destination: Home Respiratory Rate (breaths/min): 18 Transportation: Private Auto Blood Pressure (mmHg): 118/74 Accompanied By: daughter Schedule Follow-up Appointment: Yes Clinical Summary of Care: Patient Declined Electronic Signature(s) Signed: 04/26/2020 5:50:13 PM By: Levan Hurst RN, BSN Entered By: Levan Hurst on 04/26/2020 14:25:43 -------------------------------------------------------------------------------- Lower Extremity Assessment Details Patient Name: Date of Service: [name] Foley F. 04/26/2020 9:30 A M Medical Record Number: [id number] Patient Account Number: [id number] Date of Birth/Sex: Treating RN: May 11, 1945 (75 y.o. [name] [name] Primary Care Keighan Amezcua: [name], [name] Other Clinician: Referring Bertie Mcconathy: Treating Rejoice Heatwole/Extender: [name], [name] [name], [name] Weeks in Treatment: 210 Edema Assessment Assessed: Shirlyn Goltz: No] [Right: No] Edema: [Left: No] [Right: No] Calf Left: Right: Point of Measurement: 36 cm From Medial Instep 27.5 cm 27 cm Ankle Left: Right: Point of Measurement: 11 cm From Medial Instep 19.5 cm 19.4 cm Vascular Assessment Pulses: Dorsalis Pedis Palpable: [Left:No] [Right:No] Electronic Signature(s) Signed: 04/26/2020 5:39:46 PM By: Baruch Gouty RN, BSN Entered By: Baruch Gouty on 04/26/2020 09:23:18 -------------------------------------------------------------------------------- Multi Wound Chart Details Patient Name: Date of Service: [name] Foley F. 04/26/2020 9:30 A M Medical Record Number: [id number] Patient Account Number: [id number] Date of Birth/Sex: Treating RN: 30-Mar-1946 (75 y.o. [name], [name] Primary Care Mykira Hofmeister: [name], [name] Other Clinician: Referring Robbin Loughmiller: Treating Adarius Tigges/Extender:  [name], [name] [name], [name] Weeks in Treatment: 210 Vital Signs Height(in): 74 Capillary Blood Glucose(mg/dl): 117 Weight(lbs): 150 Pulse(bpm): 76 Body Mass Index(BMI): 19 Blood Pressure(mmHg): 118/74 Temperature(F): 98.1 Respiratory Rate(breaths/min): 18 Photos: [14:No Photos Left, Lateral Lower Leg] [25:No Photos Right, Lateral Lower Leg] [26:No Photos Left, Dorsal Foot] Wound Location: [14:Gradually Appeared] [25:Gradually Appeared] [26:Gradually Appeared] Wounding Event: [14:Diabetic Wound/Ulcer of the Lower] [25:Venous Leg Ulcer] [26:Diabetic Wound/Ulcer of the Lower] Primary Etiology: [14:Extremity Anemia, Sleep Apnea, Arrhythmia,] [25:Anemia, Sleep Apnea, Arrhythmia,] [26:Extremity Anemia, Sleep Apnea, Arrhythmia,] Comorbid History: [  SYLER, NORCIA ([id number]) Visit Report for 04/26/2020 Arrival Information Details Patient Name: Date of Service: [name] [facility] F. 04/26/2020 9:30 A M Medical Record Number: [id number] Patient Account Number: [id number] Date of Birth/Sex: Treating RN: 1945-11-03 (75 y.o. [name] [name] Primary Care Marce Charlesworth: [name], [name] Other Clinician: Referring Gunnar Hereford: Treating Anner Baity/Extender: [name], [name] [name], [name] Weeks in Treatment: 210 Visit Information History Since Last Visit Added or deleted any medications: No Patient Arrived: Wheel Chair Any new allergies or adverse reactions: No Arrival Time: 09:13 Had a fall or experienced change in No Accompanied By: dgt activities of daily living that may affect Transfer Assistance: None risk of falls: Patient Identification Verified: Yes Signs or symptoms of abuse/neglect since last visito No Secondary Verification Process Completed: Yes Hospitalized since last visit: No Patient Requires Transmission-Based Precautions: No Implantable device outside of the clinic excluding No Patient Has Alerts: Yes cellular tissue based products placed in the center Patient Alerts: Patient on Blood Thinner since last visit: left ABI 1.0; rt ABI 1.09 Has Dressing in Place as Prescribed: Yes Has Compression in Place as Prescribed: No Pain Present Now: No Electronic Signature(s) Signed: 04/26/2020 5:39:46 PM By: Baruch Gouty RN, BSN Entered By: Baruch Gouty on 04/26/2020 09:14:29 -------------------------------------------------------------------------------- Encounter Discharge Information Details Patient Name: Date of Service: [name] Foley F. 04/26/2020 9:30 A M Medical Record Number: [id number] Patient Account Number: [id number] Date of Birth/Sex: Treating RN: 10-04-1945 (75 y.o. [name] Primary Care Holman Bonsignore: [name], [name] Other Clinician: Referring Saleh Ulbrich: Treating Kelton Bultman/Extender: [name], [name] [name],  [name] Weeks in Treatment: 210 Encounter Discharge Information Items Post Procedure Vitals Discharge Condition: Stable Temperature (F): 98.1 Ambulatory Status: Wheelchair Pulse (bpm): 76 Discharge Destination: Home Respiratory Rate (breaths/min): 18 Transportation: Private Auto Blood Pressure (mmHg): 118/74 Accompanied By: daughter Schedule Follow-up Appointment: Yes Clinical Summary of Care: Patient Declined Electronic Signature(s) Signed: 04/26/2020 5:50:13 PM By: Levan Hurst RN, BSN Entered By: Levan Hurst on 04/26/2020 14:25:43 -------------------------------------------------------------------------------- Lower Extremity Assessment Details Patient Name: Date of Service: [name] Foley F. 04/26/2020 9:30 A M Medical Record Number: [id number] Patient Account Number: [id number] Date of Birth/Sex: Treating RN: May 11, 1945 (75 y.o. [name] [name] Primary Care Keighan Amezcua: [name], [name] Other Clinician: Referring Bertie Mcconathy: Treating Rejoice Heatwole/Extender: [name], [name] [name], [name] Weeks in Treatment: 210 Edema Assessment Assessed: Shirlyn Goltz: No] [Right: No] Edema: [Left: No] [Right: No] Calf Left: Right: Point of Measurement: 36 cm From Medial Instep 27.5 cm 27 cm Ankle Left: Right: Point of Measurement: 11 cm From Medial Instep 19.5 cm 19.4 cm Vascular Assessment Pulses: Dorsalis Pedis Palpable: [Left:No] [Right:No] Electronic Signature(s) Signed: 04/26/2020 5:39:46 PM By: Baruch Gouty RN, BSN Entered By: Baruch Gouty on 04/26/2020 09:23:18 -------------------------------------------------------------------------------- Multi Wound Chart Details Patient Name: Date of Service: [name] Foley F. 04/26/2020 9:30 A M Medical Record Number: [id number] Patient Account Number: [id number] Date of Birth/Sex: Treating RN: 30-Mar-1946 (75 y.o. [name], [name] Primary Care Mykira Hofmeister: [name], [name] Other Clinician: Referring Robbin Loughmiller: Treating Adarius Tigges/Extender:  [name], [name] [name], [name] Weeks in Treatment: 210 Vital Signs Height(in): 74 Capillary Blood Glucose(mg/dl): 117 Weight(lbs): 150 Pulse(bpm): 76 Body Mass Index(BMI): 19 Blood Pressure(mmHg): 118/74 Temperature(F): 98.1 Respiratory Rate(breaths/min): 18 Photos: [14:No Photos Left, Lateral Lower Leg] [25:No Photos Right, Lateral Lower Leg] [26:No Photos Left, Dorsal Foot] Wound Location: [14:Gradually Appeared] [25:Gradually Appeared] [26:Gradually Appeared] Wounding Event: [14:Diabetic Wound/Ulcer of the Lower] [25:Venous Leg Ulcer] [26:Diabetic Wound/Ulcer of the Lower] Primary Etiology: [14:Extremity Anemia, Sleep Apnea, Arrhythmia,] [25:Anemia, Sleep Apnea, Arrhythmia,] [26:Extremity Anemia, Sleep Apnea, Arrhythmia,] Comorbid History: [  SYLER, NORCIA ([id number]) Visit Report for 04/26/2020 Arrival Information Details Patient Name: Date of Service: [name] [facility] F. 04/26/2020 9:30 A M Medical Record Number: [id number] Patient Account Number: [id number] Date of Birth/Sex: Treating RN: 1945-11-03 (75 y.o. [name] [name] Primary Care Marce Charlesworth: [name], [name] Other Clinician: Referring Gunnar Hereford: Treating Anner Baity/Extender: [name], [name] [name], [name] Weeks in Treatment: 210 Visit Information History Since Last Visit Added or deleted any medications: No Patient Arrived: Wheel Chair Any new allergies or adverse reactions: No Arrival Time: 09:13 Had a fall or experienced change in No Accompanied By: dgt activities of daily living that may affect Transfer Assistance: None risk of falls: Patient Identification Verified: Yes Signs or symptoms of abuse/neglect since last visito No Secondary Verification Process Completed: Yes Hospitalized since last visit: No Patient Requires Transmission-Based Precautions: No Implantable device outside of the clinic excluding No Patient Has Alerts: Yes cellular tissue based products placed in the center Patient Alerts: Patient on Blood Thinner since last visit: left ABI 1.0; rt ABI 1.09 Has Dressing in Place as Prescribed: Yes Has Compression in Place as Prescribed: No Pain Present Now: No Electronic Signature(s) Signed: 04/26/2020 5:39:46 PM By: Baruch Gouty RN, BSN Entered By: Baruch Gouty on 04/26/2020 09:14:29 -------------------------------------------------------------------------------- Encounter Discharge Information Details Patient Name: Date of Service: [name] Foley F. 04/26/2020 9:30 A M Medical Record Number: [id number] Patient Account Number: [id number] Date of Birth/Sex: Treating RN: 10-04-1945 (75 y.o. [name] Primary Care Holman Bonsignore: [name], [name] Other Clinician: Referring Saleh Ulbrich: Treating Kelton Bultman/Extender: [name], [name] [name],  [name] Weeks in Treatment: 210 Encounter Discharge Information Items Post Procedure Vitals Discharge Condition: Stable Temperature (F): 98.1 Ambulatory Status: Wheelchair Pulse (bpm): 76 Discharge Destination: Home Respiratory Rate (breaths/min): 18 Transportation: Private Auto Blood Pressure (mmHg): 118/74 Accompanied By: daughter Schedule Follow-up Appointment: Yes Clinical Summary of Care: Patient Declined Electronic Signature(s) Signed: 04/26/2020 5:50:13 PM By: Levan Hurst RN, BSN Entered By: Levan Hurst on 04/26/2020 14:25:43 -------------------------------------------------------------------------------- Lower Extremity Assessment Details Patient Name: Date of Service: [name] Foley F. 04/26/2020 9:30 A M Medical Record Number: [id number] Patient Account Number: [id number] Date of Birth/Sex: Treating RN: May 11, 1945 (75 y.o. [name] [name] Primary Care Keighan Amezcua: [name], [name] Other Clinician: Referring Bertie Mcconathy: Treating Rejoice Heatwole/Extender: [name], [name] [name], [name] Weeks in Treatment: 210 Edema Assessment Assessed: Shirlyn Goltz: No] [Right: No] Edema: [Left: No] [Right: No] Calf Left: Right: Point of Measurement: 36 cm From Medial Instep 27.5 cm 27 cm Ankle Left: Right: Point of Measurement: 11 cm From Medial Instep 19.5 cm 19.4 cm Vascular Assessment Pulses: Dorsalis Pedis Palpable: [Left:No] [Right:No] Electronic Signature(s) Signed: 04/26/2020 5:39:46 PM By: Baruch Gouty RN, BSN Entered By: Baruch Gouty on 04/26/2020 09:23:18 -------------------------------------------------------------------------------- Multi Wound Chart Details Patient Name: Date of Service: [name] Foley F. 04/26/2020 9:30 A M Medical Record Number: [id number] Patient Account Number: [id number] Date of Birth/Sex: Treating RN: 30-Mar-1946 (75 y.o. [name], [name] Primary Care Mykira Hofmeister: [name], [name] Other Clinician: Referring Robbin Loughmiller: Treating Adarius Tigges/Extender:  [name], [name] [name], [name] Weeks in Treatment: 210 Vital Signs Height(in): 74 Capillary Blood Glucose(mg/dl): 117 Weight(lbs): 150 Pulse(bpm): 76 Body Mass Index(BMI): 19 Blood Pressure(mmHg): 118/74 Temperature(F): 98.1 Respiratory Rate(breaths/min): 18 Photos: [14:No Photos Left, Lateral Lower Leg] [25:No Photos Right, Lateral Lower Leg] [26:No Photos Left, Dorsal Foot] Wound Location: [14:Gradually Appeared] [25:Gradually Appeared] [26:Gradually Appeared] Wounding Event: [14:Diabetic Wound/Ulcer of the Lower] [25:Venous Leg Ulcer] [26:Diabetic Wound/Ulcer of the Lower] Primary Etiology: [14:Extremity Anemia, Sleep Apnea, Arrhythmia,] [25:Anemia, Sleep Apnea, Arrhythmia,] [26:Extremity Anemia, Sleep Apnea, Arrhythmia,] Comorbid History: [  Jul 23, 1945 (75 y.o. [name] [name] Primary Care Sunday Klos: [name], [name] Other Clinician: Referring Anuj Summons: Treating Letica Giaimo/Extender: [name], [name] [name], [name] Weeks in Treatment: 210 Active Problems Location of Pain Severity and Description of Pain Patient Has Paino No Site Locations Rate the pain. Current Pain Level: 0 Pain Management and Medication Current Pain Management: Electronic Signature(s) Signed: 04/26/2020 5:39:46 PM By: Baruch Gouty RN, BSN Entered By: Baruch Gouty on 04/26/2020 09:16:53 -------------------------------------------------------------------------------- Patient/Caregiver Education Details Patient Name: Date of Service: [name] [name] 1/4/2022andnbsp9:30 A M Medical Record Number: [id number] Patient Account Number: [id number] Date of Birth/Gender: Treating RN: 23-Mar-1946 (75 y.o. [name] [name] Primary Care Physician: [name], [name] Other Clinician: Referring Physician: Treating Physician/Extender: [name], [name] [name], [name] Weeks in Treatment: 210 Education Assessment Education Provided To: Patient Education Topics Provided Wound/Skin Impairment: Handouts: Caring for Your Ulcer, Skin Care Do's and Dont's Methods: Explain/Verbal Responses: State content correctly Electronic Signature(s) Signed: 04/26/2020 5:38:12 PM By: [name] Entered By: [name] on 04/26/2020 09:16:46 -------------------------------------------------------------------------------- Wound Assessment Details Patient Name: Date of Service: [name] Foley F. 04/26/2020 9:30 A M Medical Record Number: [id number] Patient Account Number: [id number] Date of Birth/Sex: Treating RN: Jan 29, 1946 (75 y.o. [name] [name] Primary Care Skipper Dacosta: [name], [name] Other Clinician: Referring Andre Swander: Treating Moriah Shawley/Extender: [name], [name] [name], [name] Weeks in Treatment: 210 Wound  Status Wound Number: 14 Primary Diabetic Wound/Ulcer of the Lower Extremity Etiology: Wound Location: Left, Lateral Lower Leg Wound Open Wounding Event: Gradually Appeared Status: Date Acquired: 08/15/2017 Comorbid Anemia, Sleep Apnea, Arrhythmia, Congestive Heart Failure, Weeks Of Treatment: 140 History: Coronary Artery Disease, Hypertension, Myocardial Infarction, Clustered Wound: Yes Type II Diabetes, Gout, Neuropathy Wound Measurements Length: (cm) 8.4 Width: (cm) 1.2 Depth: (cm) 0.2 Clustered Quantity: 2 Area: (cm) 7.917 Volume: (cm) 1.583 Wound Description Classification: Grade 2 Wound Margin: Flat and Intact Exudate Amount: Medium Exudate Type: Serosanguineous Exudate Color: red, brown Foul Odor After Cleansing: No Slough/Fibrino Yes % Reduction in Area: -740.4% % Reduction in Volume: -1584% Epithelialization: Small (1-33%) Tunneling: No Undermining: No Wound Bed Granulation Amount: Medium (34-66%) Exposed Structure Granulation Quality: Red, Pink, Friable Fascia Exposed: No Necrotic Amount: Medium (34-66%) Fat Layer (Subcutaneous Tissue) Exposed: Yes Necrotic Quality: Eschar, Adherent Slough Tendon Exposed: No Muscle Exposed: No Joint Exposed: No Bone Exposed: No Treatment Notes Wound #14 (Lower Leg) Wound Laterality: Left, Lateral Cleanser Soap and Water Discharge Instruction: May shower and wash wound with dial antibacterial soap and water prior to dressing change. Peri-Wound Care Sween Lotion (Moisturizing lotion) Discharge Instruction: Apply moisturizing lotion as directed Topical Primary Dressing KerraCel Ag Gelling Fiber Dressing, 4x5 in (silver alginate) Discharge Instruction: Apply silver alginate to wound bed as instructed Secondary Dressing Woven Gauze Sponge, Non-Sterile 4x4 in Discharge Instruction: Apply over primary dressing as directed. ABD Pad, 5x9 Discharge Instruction: Apply over primary dressing as directed. Secured With [employer] 4x5 (in/yd) Discharge Instruction: Secure with Coban as directed. Kerlix Roll Sterile, 4.5x3.1 (in/yd) Discharge Instruction: Secure with Kerlix as directed. Compression Wrap Compression Stockings Add-Ons Electronic Signature(s) Signed: 04/26/2020 5:39:46 PM By: Baruch Gouty RN, BSN Entered By: Baruch Gouty on 04/26/2020 09:37:10 -------------------------------------------------------------------------------- Wound Assessment Details Patient Name: Date of Service: [name] Foley F. 04/26/2020 9:30 A M Medical Record Number: [id number] Patient Account Number: [id number] Date of Birth/Sex: Treating RN: 11-Apr-1946 (75 y.o. [name] [name] Primary Care Giovanie Lefebre: [name], [name] Other Clinician: Referring Lakai Moree: Treating Jaquelyn Sakamoto/Extender: [name], [name] [name], [name] Weeks in Treatment: 210 Wound Status Wound Number: 25 Primary Venous Leg Ulcer  Jul 23, 1945 (75 y.o. [name] [name] Primary Care Sunday Klos: [name], [name] Other Clinician: Referring Anuj Summons: Treating Letica Giaimo/Extender: [name], [name] [name], [name] Weeks in Treatment: 210 Active Problems Location of Pain Severity and Description of Pain Patient Has Paino No Site Locations Rate the pain. Current Pain Level: 0 Pain Management and Medication Current Pain Management: Electronic Signature(s) Signed: 04/26/2020 5:39:46 PM By: Baruch Gouty RN, BSN Entered By: Baruch Gouty on 04/26/2020 09:16:53 -------------------------------------------------------------------------------- Patient/Caregiver Education Details Patient Name: Date of Service: [name] [name] 1/4/2022andnbsp9:30 A M Medical Record Number: [id number] Patient Account Number: [id number] Date of Birth/Gender: Treating RN: 23-Mar-1946 (75 y.o. [name] [name] Primary Care Physician: [name], [name] Other Clinician: Referring Physician: Treating Physician/Extender: [name], [name] [name], [name] Weeks in Treatment: 210 Education Assessment Education Provided To: Patient Education Topics Provided Wound/Skin Impairment: Handouts: Caring for Your Ulcer, Skin Care Do's and Dont's Methods: Explain/Verbal Responses: State content correctly Electronic Signature(s) Signed: 04/26/2020 5:38:12 PM By: [name] Entered By: [name] on 04/26/2020 09:16:46 -------------------------------------------------------------------------------- Wound Assessment Details Patient Name: Date of Service: [name] Foley F. 04/26/2020 9:30 A M Medical Record Number: [id number] Patient Account Number: [id number] Date of Birth/Sex: Treating RN: Jan 29, 1946 (75 y.o. [name] [name] Primary Care Skipper Dacosta: [name], [name] Other Clinician: Referring Andre Swander: Treating Moriah Shawley/Extender: [name], [name] [name], [name] Weeks in Treatment: 210 Wound  Status Wound Number: 14 Primary Diabetic Wound/Ulcer of the Lower Extremity Etiology: Wound Location: Left, Lateral Lower Leg Wound Open Wounding Event: Gradually Appeared Status: Date Acquired: 08/15/2017 Comorbid Anemia, Sleep Apnea, Arrhythmia, Congestive Heart Failure, Weeks Of Treatment: 140 History: Coronary Artery Disease, Hypertension, Myocardial Infarction, Clustered Wound: Yes Type II Diabetes, Gout, Neuropathy Wound Measurements Length: (cm) 8.4 Width: (cm) 1.2 Depth: (cm) 0.2 Clustered Quantity: 2 Area: (cm) 7.917 Volume: (cm) 1.583 Wound Description Classification: Grade 2 Wound Margin: Flat and Intact Exudate Amount: Medium Exudate Type: Serosanguineous Exudate Color: red, brown Foul Odor After Cleansing: No Slough/Fibrino Yes % Reduction in Area: -740.4% % Reduction in Volume: -1584% Epithelialization: Small (1-33%) Tunneling: No Undermining: No Wound Bed Granulation Amount: Medium (34-66%) Exposed Structure Granulation Quality: Red, Pink, Friable Fascia Exposed: No Necrotic Amount: Medium (34-66%) Fat Layer (Subcutaneous Tissue) Exposed: Yes Necrotic Quality: Eschar, Adherent Slough Tendon Exposed: No Muscle Exposed: No Joint Exposed: No Bone Exposed: No Treatment Notes Wound #14 (Lower Leg) Wound Laterality: Left, Lateral Cleanser Soap and Water Discharge Instruction: May shower and wash wound with dial antibacterial soap and water prior to dressing change. Peri-Wound Care Sween Lotion (Moisturizing lotion) Discharge Instruction: Apply moisturizing lotion as directed Topical Primary Dressing KerraCel Ag Gelling Fiber Dressing, 4x5 in (silver alginate) Discharge Instruction: Apply silver alginate to wound bed as instructed Secondary Dressing Woven Gauze Sponge, Non-Sterile 4x4 in Discharge Instruction: Apply over primary dressing as directed. ABD Pad, 5x9 Discharge Instruction: Apply over primary dressing as directed. Secured With [employer] 4x5 (in/yd) Discharge Instruction: Secure with Coban as directed. Kerlix Roll Sterile, 4.5x3.1 (in/yd) Discharge Instruction: Secure with Kerlix as directed. Compression Wrap Compression Stockings Add-Ons Electronic Signature(s) Signed: 04/26/2020 5:39:46 PM By: Baruch Gouty RN, BSN Entered By: Baruch Gouty on 04/26/2020 09:37:10 -------------------------------------------------------------------------------- Wound Assessment Details Patient Name: Date of Service: [name] Foley F. 04/26/2020 9:30 A M Medical Record Number: [id number] Patient Account Number: [id number] Date of Birth/Sex: Treating RN: 11-Apr-1946 (75 y.o. [name] [name] Primary Care Giovanie Lefebre: [name], [name] Other Clinician: Referring Lakai Moree: Treating Jaquelyn Sakamoto/Extender: [name], [name] [name], [name] Weeks in Treatment: 210 Wound Status Wound Number: 25 Primary Venous Leg Ulcer  No Tendon: No Tendon: No Tendon: No Muscle: No Muscle: No Muscle: No Joint: No Joint: No Joint: No Bone: No Bone: No Bone: No Small (1-33%) N/A Small (1-33%) Epithelialization: N/A N/A N/A Debridement: N/A N/A  N/A Pain Control: N/A N/A N/A Tissue Debrided: N/A N/A N/A Level: N/A N/A N/A Debridement A (sq cm): rea N/A N/A N/A Instrument: N/A N/A N/A Bleeding: N/A N/A N/A Hemostasis Achieved: N/A N/A N/A Procedural Pain: N/A N/A N/A Post Procedural Pain: N/A N/A N/A Debridement Treatment Response: N/A N/A N/A Post Debridement Measurements L x W x D (cm) N/A N/A N/A Post Debridement Volume: (cm) N/A N/A N/A Procedures Performed: Wound Number: 31 N/A N/A Photos: No Photos N/A N/A Right Knee N/A N/A Wound Location: Gradually Appeared N/A N/A Wounding Event: Abrasion N/A N/A Primary Etiology: Anemia, Sleep Apnea, Arrhythmia, N/A N/A Comorbid History: Congestive Heart Failure, Coronary Artery Disease, Hypertension, Myocardial Infarction, Type II Diabetes, Gout, Neuropathy 04/24/2020 N/A N/A Date A cquired: 0 N/A N/A Weeks of Treatment: Open N/A N/A Wound Status: No N/A N/A Clustered Wound: N/A N/A N/A Clustered Quantity: 1.4x1.3x0.1 N/A N/A Measurements L x W x D (cm) 1.429 N/A N/A A (cm) : rea 0.143 N/A N/A Volume (cm) : N/A N/A N/A % Reduction in A rea: N/A N/A N/A % Reduction in Volume: Full Thickness Without Exposed N/A N/A Classification: Support Structures Medium N/A N/A Exudate A mount: Serosanguineous N/A N/A Exudate Type: red, brown N/A N/A Exudate Color: Flat and Intact N/A N/A Wound Margin: Large (67-100%) N/A N/A Granulation A mount: Red N/A N/A Granulation Quality: None Present (0%) N/A N/A Necrotic A mount: N/A N/A N/A Necrotic Tissue: Fat Layer (Subcutaneous Tissue): Yes N/A N/A Exposed Structures: Fascia: No Tendon: No Muscle: No Joint: No Bone: No Small (1-33%) N/A N/A Epithelialization: N/A N/A N/A Debridement: N/A N/A N/A Pain Control: N/A N/A N/A Tissue Debrided: N/A N/A N/A Level: N/A N/A N/A Debridement A (sq cm): rea N/A N/A N/A Instrument: N/A N/A N/A Bleeding: N/A N/A N/A Hemostasis A chieved: N/A  N/A N/A Procedural Pain: N/A N/A N/A Post Procedural Pain: Debridement Treatment Response: N/A N/A N/A Post Debridement Measurements L x N/A N/A N/A W x D (cm) N/A N/A N/A Post Debridement Volume: (cm) N/A N/A N/A Procedures Performed: Treatment Notes Electronic Signature(s) Signed: 04/26/2020 4:50:25 PM By: Linton Ham MD Signed: 04/26/2020 5:38:12 PM By: [name] Entered By: Linton Ham on 04/26/2020 09:54:02 -------------------------------------------------------------------------------- Multi-Disciplinary Care Plan Details Patient Name: Date of Service: [name] Foley F. 04/26/2020 9:30 A M Medical Record Number: [id number] Patient Account Number: [id number] Date of Birth/Sex: Treating RN: 1945-08-19 (75 y.o. [name], [name] Primary Care Alanzo Lamb: [name], [name] Other Clinician: Referring Lashara Urey: Treating Heather Streeper/Extender: [name], [name] [name], [name] Weeks in Treatment: 210 Active Inactive Wound/Skin Impairment Nursing Diagnoses: Impaired tissue integrity Goals: Patient/caregiver will verbalize understanding of skin care regimen Date Initiated: 11/19/2017 Target Resolution Date: 05/19/2020 Goal Status: Active Ulcer/skin breakdown will have a volume reduction of 30% by week 4 Date Initiated: 11/19/2017 Date Inactivated: 12/02/2017 Target Resolution Date: 12/22/2017 Goal Status: Unmet Unmet Reason: multipl comorbidities Interventions: Assess patient/caregiver ability to obtain necessary supplies Assess ulceration(s) every visit Provide education on ulcer and skin care Notes: Electronic Signature(s) Signed: 04/26/2020 5:38:12 PM By: [name] Entered By: [name] on 04/26/2020 09:59:01 -------------------------------------------------------------------------------- Pain Assessment Details Patient Name: Date of Service: [name] Foley F. 04/26/2020 9:30 A M Medical Record Number: [id number] Patient Account Number:  [id number] Date of Birth/Sex: Treating RN:

## 2020-04-26 NOTE — Progress Notes (Signed)
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 no improvement in the left lateral calf. This is a major deterioration from when he was here 3 weeks ago. In addition he has 2 superficial areas one above this and one below. The area over the dorsal foot has necrotic tendon which I removed. 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 I did not think at that time that that anything to do with the upper left tibial wound. 10/27; this is a 3-week follow-up. The left lateral calf actually looks some better. Still open but generally smaller with a healthy looking surface. I believe I gave him a course of antibiotics when he was here last time. -He also has the area on the dorsal ankle. Again an extensive debridement required here today Finally has a new wound on the left great toe dorsally. He is not precisely aware when this occurred. Using Hydrofera Blue on all wound area 11/17; 3-week follow-up. Left lateral calf certainly not any smaller. May be somewhat  larger. He has a large area on the dorsal ankle which I thought was an initial wrap injury covered in necrotic debris also an area on the left great toe dorsally 12/8; 3-week follow-up. Left lateral calf perhaps somewhat less wide he has 2 areas here which concerned me. He has the area on the left dorsal foot/ankle  necrotic debris over the surface and then an area on the left great toe. He comes in this week with excoriations right down his anterior tibia. He is unaware of how this happened. Some erythema around the left lateral calf although no evidence of cellulitis He had arterial studies that suggested only mild arterial disease. I am not sure whether he has had reflux studies or not. I will need to research this 04/28/2019; patient comes in today with improved areas on the left calf and the dorsal left foot/ankle. However he has a reopening on the right lateral calcaneus. 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 He is mystified about how this would have happened although looking at how he lies in the examining table is exactly how this would have happened although he claims he uses bunny boots at night etc. We have been using Sorbact to all wound areas. He had a significant excoriated rash on his left leg when he was here last time. We changed from Kerlix to cotton under the Coban and this seems to have helped 2/2; 1 month follow-up. The patient has open areas on the left lateral tibial area mid lower leg, left dorsal foot/ankle and an area on the right lateral calcaneus. The right lateral calcaneus is better as is the left dorsal foot/ankle. The area on the left lateral tibia however is larger. 3/9; 1 month follow-up. The left lateral tibia looks mostly the same. Left dorsal foot and ankle improved and the area on the right lateral calcaneus is quite a bit worse. He has had previous  arterial studies last year that were fairly normal. I continue to think that these are pressure areas. We have been using silver alginate on all the wounds 3/30; 3-week follow-up. The left lateral tibia is still open but with less width. Left dorsal foot small punched out wound. He has a large area on his right lateral calcaneus and an area that I did not pick up on last time on the tip of the left calcaneus As well he has a new area on the right lateral calf which was new today. 4/13; this is a patient with multiple medical problems. He has been in this clinic for a very prolonged period of time with different wounds in different areas in his lower extremities. Currently we have the left lateral tibia, left heel, left dorsal foot, right lateral heel and right lateral lower tibia. We have been using Santyl on the right and silver alginate on the left He has his daughter who changes his dressings. The reason behind the nonhealing here and ulcer recurrence is really never been clear to me. The patient is debilitated. He uses bunny boots apparently at night to protect his heels and feet. His arterial studies have previously not suggested a primary arterial etiology 4/27 2-week follow-up. He has a new wound on the right anterior patella currently trauma against the wheelchair. Failing this had he has areas on the right lateral calf and right lateral calcaneus both of which we have been using Santyl. On the left side one of the original wounds on the left lateral tibia area, left calcaneus, punched out areas on the left dorsal foot. On the left we have been using PolyMem Ag 5/11; 2-week follow-up. He does not have any new wounds this week and most of his wounds look better. Using Santyl on the  right lateral heel and right lateral ankle and polymen Ag and everything else under compression. 09/15/19-Patient at 2-week follow-up has to the wounds about the same and the rest of the wounds smaller in  dimensions, the left posterior wound is about the same, one of the wounds is healed on the right knee. 6/8; 2-week follow-up. His left lateral calf wound is now split into 2. He has an area on the left heel which is a pressure ulcer which I think is improving he also had the wrap injury areas on the crease of his left ankle/foot that are better. Still an area on the right heel that is also improved we have been using PolyMem Ag 6/22; 2-week follow-up. The left lateral calf wound measures about the same. Left heel is closed Right heel still open but looks better. Assorted wounds on the dorsal foot and ankle 1 of these requires debridement small wound with some depth. We have been using PolyMem Ag to all wound areas 7/13; Left heel is closed Right heel is open but looks smaller Small area on the right lateral calf looks smaller [place] of wounds on the dorsal left foot and ankle. All of these are superficial The substantial area on the left lateral mid tibia. Necrotic debris on the surface. I really do not completely understand this wound. Not making any progress. We have been using PolyMem Ag to all the wounds 8/10-Patient returns at 1 month, the left dorsal foot and ankle wounds are about the same, the left lateral wound on the leg definitely bigger, using PolyMem to all the wounds except the left lateral leg wound where Iodoflex is being tried. Overall these wounds seem to fluctuate in the progress with no clear resolution 8/31; I have not seen this man in over a month and a half. His area on the left leg is much more substantial necrotic surface much longer. Our staff noted purulent drainage. He still has breakdown in the left foot and ankle I am not quite sure how to explain this. On the right side his right lateral heel is just about closed closed. Small area on the right lateral calf. His left heel is also closed 9/13; 2-week follow-up. Because of the increase in size and the condition  of the wound on the left lateral leg I did a PCR culture on this/deep tissue culture last time this showed methicillin resistant staph aureus and group B strep. I use clindamycin, the patient is allergic to Septra and there was a tetracycline resistant gene detected as well 9/27; 2-week follow-up. Left lateral leg looks stable to improved slightly smaller. He has a scattering of areas on his left dorsal foot but nothing on the left heel. On the right heel a small open area on the lateral aspect and also on the lateral part of his lower leg. We have been using silver alginate under compression As I have often done in this man I feel no reviewed his arterial studies which were last looked at in June 2020 at that point he had an ABI in the right of 0.93 monophasic waveforms however but the great toe pressure was 110 with a TBI of 0.89 on the left his ABI was 0.84 with a great toe pressure of 120 and a TBI of 0.97. This was indicative of mild arterial disease 10/26; the patient arrives with the wounds on his bilateral lower extremities looking as good as I have seen him in quite some time. First of all the lateral  heels are both healed. There is callus here however no open wounds. The substantial area on his left upper lateral tibia which has been so refractory looks a lot better. He still has multiple small open areas on the dorsal left foot and ankle. One of these at the crease of the foot and ankle is actually quite deep and has debris in the center. On the right leg he only has one small open area on the right lateral leg this is just about closed We apparently had the order of silver alginate they come in with polymen Ag. However given the fact that things look so much better I am going to continue with the latter 04/26/2020; patient returns to the clinic after about a 9 to 10-week hiatus. I am not exactly sure why. The patient says he had a cold and was sick. He was apparently tested for Covid but was  negative. He literally dropped off our schedule. He arrives back in clinic with a multitude of wounds especially on the left dorsal foot and lower leg. There are also areas on the right dorsal foot and the right lower leg. Fortunately the pressure ulcers on the bilateral lateral heels of once again closed. Again the history here is vague. Apparently everything is deteriorated on the left greater than right over the last 2 to 3 weeks. His daughter wondered whether they were not wrapping this properly they have been applying topical antibiotic and a kerlix Coban wrap. He absolutely denies any pain Electronic Signature(s) Signed: 04/26/2020 4:50:25 PM By: Baltazar Najjar MD Entered By: Baltazar Najjar on 04/26/2020 09:59:24 -------------------------------------------------------------------------------- Physical Exam Details Patient Name: Date of Service: Maude Leriche F. 04/26/2020 9:30 A M Medical Record Number: [id number] Patient Account Number: [id number] Date of Birth/Sex: Treating RN: 1946-04-02 (75 y.o. Lucious Groves Primary Care Provider: [place], [name] Other Clinician: Referring Provider: Treating Provider/Extender: Etherine Mackowiak [place], [name] Weeks in Treatment: 210 Constitutional Sitting or standing Blood Pressure is within target range for patient.. Pulse regular and within target range for patient.Marland Kitchen Respirations regular, non-labored and within target range.. Temperature is normal and within the target range for the patient.Marland Kitchen Appears in no distress. Cardiovascular Popliteal pulse palpable on the left. Pedal pulses are reduced but palpable at the dorsalis pedis and posterior tibial bilaterally. Notes Wound exam; everything is much worse than what I remember 9 or 10 weeks ago. At that point things look quite good especially the refractory wound on his right lateral tibia. This particular wound actually is gotten smaller. Some necrotic debris on the surface I debrided with a #5  curette. Once more surprising is the extent of the small ulcers on his left dorsal foot left dorsal ankle left lower leg to a lesser extent on the right dorsal foot and right lower leg. The large area on the left dorsal foot required debridement with a #15 scalpel and pickups. He has exposed tendon underneath this He has no open area on the lateral heels which have been his problematic areas from when he first came into this clinic Electronic Signature(s) Signed: 04/26/2020 4:50:25 PM By: Baltazar Najjar MD Entered By: Baltazar Najjar on 04/26/2020 10:01:24 -------------------------------------------------------------------------------- Physician Orders Details Patient Name: Date of Service: Maude Leriche F. 04/26/2020 9:30 A M Medical Record Number: [id number] Patient Account Number: [id number] Date of Birth/Sex: Treating RN: Jan 18, 1946 (75 y.o. Lucious Groves Primary Care Provider: [place], [name] Other Clinician: Referring Provider: Treating Provider/Extender: Shlomo Seres [place], [name] Weeks in Treatment: [phone] Verbal /  Phone Orders: No Diagnosis Coding ICD-10 Coding Code Description E11.621 Type 2 diabetes mellitus with foot ulcer L97.221 Non-pressure chronic ulcer of left calf limited to breakdown of skin L97.321 Non-pressure chronic ulcer of left ankle limited to breakdown of skin L97.521 Non-pressure chronic ulcer of other part of left foot limited to breakdown of skin L97.211 Non-pressure chronic ulcer of right calf limited to breakdown of skin Follow-up Appointments Return Appointment in 1 week. Bathing/ Shower/ Hygiene May shower with protection but do not get wound dressing(s) wet. Edema Control - Lymphedema / SCD / Other Elevate legs to the level of the heart or above for 30 minutes daily and/or when sitting, a frequency of: Avoid standing for long periods of time. Off-Loading Other: - multipodus boots to both feet Wound Treatment Wound #14 - Lower Leg Wound  Laterality: Left, Lateral Cleanser: Soap and Water 1 x Per Week/15 Days Discharge Instructions: May shower and wash wound with dial antibacterial soap and water prior to dressing change. Peri-Wound Care: Sween Lotion (Moisturizing lotion) 1 x Per Week/15 Days Discharge Instructions: Apply moisturizing lotion as directed Prim Dressing: KerraCel Ag Gelling Fiber Dressing, 4x5 in (silver alginate) 1 x Per Week/15 Days ary Discharge Instructions: Apply silver alginate to wound bed as instructed Secondary Dressing: Woven Gauze Sponge, Non-Sterile 4x4 in 1 x Per Week/15 Days Discharge Instructions: Apply over primary dressing as directed. Secondary Dressing: ABD Pad, 5x9 1 x Per Week/15 Days Discharge Instructions: Apply over primary dressing as directed. Secured With: Coban Self-Adherent Wrap 4x5 (in/yd) 1 x Per Week/15 Days Discharge Instructions: Secure with Coban as directed. Secured With: [employer], 4.5x3.1 (in/yd) 1 x Per Week/15 Days Discharge Instructions: Secure with Kerlix as directed. Wound #25 - Lower Leg Wound Laterality: Right, Lateral Cleanser: Soap and Water 1 x Per Week/15 Days Discharge Instructions: May shower and wash wound with dial antibacterial soap and water prior to dressing change. Peri-Wound Care: Sween Lotion (Moisturizing lotion) 1 x Per Week/15 Days Discharge Instructions: Apply moisturizing lotion as directed Prim Dressing: KerraCel Ag Gelling Fiber Dressing, 4x5 in (silver alginate) 1 x Per Week/15 Days ary Discharge Instructions: Apply silver alginate to wound bed as instructed Secondary Dressing: Woven Gauze Sponge, Non-Sterile 4x4 in 1 x Per Week/15 Days Discharge Instructions: Apply over primary dressing as directed. Secondary Dressing: ABD Pad, 5x9 1 x Per Week/15 Days Discharge Instructions: Apply over primary dressing as directed. Secured With: Coban Self-Adherent Wrap 4x5 (in/yd) 1 x Per Week/15 Days Discharge Instructions: Secure with Coban as  directed. Secured With: [employer], 4.5x3.1 (in/yd) 1 x Per Week/15 Days Discharge Instructions: Secure with Kerlix as directed. Wound #26 - Foot Wound Laterality: Dorsal, Left Cleanser: Wound Cleanser 1 x Per Week/7 Days Discharge Instructions: Cleanse the wound with wound cleanser prior to applying a clean dressing using gauze sponges, not tissue or cotton balls. Peri-Wound Care: Sween Lotion (Moisturizing lotion) 1 x Per Week/7 Days Discharge Instructions: Apply moisturizing lotion as directed Prim Dressing: FIBRACOL Plus Dressing, 2x2 in (collagen) 1 x Per Week/7 Days ary Discharge Instructions: Moisten collagen with saline or hydrogel Secondary Dressing: Woven Gauze Sponge, Non-Sterile 4x4 in 1 x Per Week/7 Days Discharge Instructions: Apply over primary dressing as directed. Secondary Dressing: ABD Pad, 5x9 1 x Per Week/7 Days Discharge Instructions: Apply over primary dressing as directed. Secured With: Coban Self-Adherent Wrap 4x5 (in/yd) 1 x Per Week/7 Days Discharge Instructions: Secure with Coban as directed. Secured With: [employer], 4.5x3.1 (in/yd) 1 x Per Week/7 Days Discharge Instructions:  Secure with Kerlix as directed. Wound #28 - Foot Wound Laterality: Dorsal, Right Cleanser: Soap and Water 1 x Per Week/15 Days Discharge Instructions: May shower and wash wound with dial antibacterial soap and water prior to dressing change. Peri-Wound Care: Sween Lotion (Moisturizing lotion) 1 x Per Week/15 Days Discharge Instructions: Apply moisturizing lotion as directed Prim Dressing: KerraCel Ag Gelling Fiber Dressing, 4x5 in (silver alginate) 1 x Per Week/15 Days ary Discharge Instructions: Apply silver alginate to wound bed as instructed Secondary Dressing: Woven Gauze Sponge, Non-Sterile 4x4 in 1 x Per Week/15 Days Discharge Instructions: Apply over primary dressing as directed. Secondary Dressing: ABD Pad, 5x9 1 x Per Week/15 Days Discharge Instructions: Apply  over primary dressing as directed. Secured With: Coban Self-Adherent Wrap 4x5 (in/yd) 1 x Per Week/15 Days Discharge Instructions: Secure with Coban as directed. Secured With: [employer], 4.5x3.1 (in/yd) 1 x Per Week/15 Days Discharge Instructions: Secure with Kerlix as directed. Wound #29 - Calcaneus Wound Laterality: Right Cleanser: Soap and Water 1 x Per Week/15 Days Discharge Instructions: May shower and wash wound with dial antibacterial soap and water prior to dressing change. Peri-Wound Care: Sween Lotion (Moisturizing lotion) 1 x Per Week/15 Days Discharge Instructions: Apply moisturizing lotion as directed Prim Dressing: KerraCel Ag Gelling Fiber Dressing, 4x5 in (silver alginate) 1 x Per Week/15 Days ary Discharge Instructions: Apply silver alginate to wound bed as instructed Secondary Dressing: Woven Gauze Sponge, Non-Sterile 4x4 in 1 x Per Week/15 Days Discharge Instructions: Apply over primary dressing as directed. Secondary Dressing: ABD Pad, 5x9 1 x Per Week/15 Days Discharge Instructions: Apply over primary dressing as directed. Secured With: Coban Self-Adherent Wrap 4x5 (in/yd) 1 x Per Week/15 Days Discharge Instructions: Secure with Coban as directed. Secured With: [employer], 4.5x3.1 (in/yd) 1 x Per Week/15 Days Discharge Instructions: Secure with Kerlix as directed. Wound #30 - Lower Leg Wound Laterality: Left, Anterior Cleanser: Soap and Water 1 x Per Week/15 Days Discharge Instructions: May shower and wash wound with dial antibacterial soap and water prior to dressing change. Peri-Wound Care: Sween Lotion (Moisturizing lotion) 1 x Per Week/15 Days Discharge Instructions: Apply moisturizing lotion as directed Prim Dressing: KerraCel Ag Gelling Fiber Dressing, 4x5 in (silver alginate) 1 x Per Week/15 Days ary Discharge Instructions: Apply silver alginate to wound bed as instructed Secondary Dressing: Woven Gauze Sponge, Non-Sterile 4x4 in 1 x Per  Week/15 Days Discharge Instructions: Apply over primary dressing as directed. Secondary Dressing: ABD Pad, 5x9 1 x Per Week/15 Days Discharge Instructions: Apply over primary dressing as directed. Secured With: Coban Self-Adherent Wrap 4x5 (in/yd) 1 x Per Week/15 Days Discharge Instructions: Secure with Coban as directed. Secured With: [employer], 4.5x3.1 (in/yd) 1 x Per Week/15 Days Discharge Instructions: Secure with Kerlix as directed. Wound #31 - Knee Wound Laterality: Right Cleanser: Soap and Water 1 x Per Week/15 Days Discharge Instructions: May shower and wash wound with dial antibacterial soap and water prior to dressing change. Peri-Wound Care: Sween Lotion (Moisturizing lotion) 1 x Per Week/15 Days Discharge Instructions: Apply moisturizing lotion as directed Prim Dressing: KerraCel Ag Gelling Fiber Dressing, 4x5 in (silver alginate) 1 x Per Week/15 Days ary Discharge Instructions: Apply silver alginate to wound bed as instructed Secondary Dressing: Woven Gauze Sponge, Non-Sterile 4x4 in 1 x Per Week/15 Days Discharge Instructions: Apply over primary dressing as directed. Secondary Dressing: ABD Pad, 5x9 1 x Per Week/15 Days Discharge Instructions: Apply over primary dressing as directed. Secured With: Coban Self-Adherent Wrap 4x5 (in/yd) 1  x Per Week/15 Days Discharge Instructions: Secure with Coban as directed. Secured With: [employer], 4.5x3.1 (in/yd) 1 x Per Week/15 Days Discharge Instructions: Secure with Kerlix as directed. Patient Medications llergies: Sulfa A Notifications Medication Indication Start End lidocaine DOSE topical 5 % gel - gel topical Electronic Signature(s) Signed: 04/26/2020 4:50:25 PM By: Baltazar Najjar MD Signed: 04/26/2020 5:38:12 PM By: Fonnie Mu RN Entered By: Fonnie Mu on 04/26/2020 09:58:45 -------------------------------------------------------------------------------- Problem List Details Patient Name: Date  of Service: Maude Leriche F. 04/26/2020 9:30 A M Medical Record Number: [id number] Patient Account Number: [id number] Date of Birth/Sex: Treating RN: 11/17/1945 (75 y.o. Charlean Merl, [name] Primary Care Provider: [place], [name] Other Clinician: Referring Provider: Treating Provider/Extender: Kong Packett [place], [name] Weeks in Treatment: 210 Active Problems ICD-10 Encounter Code Description Active Date MDM Diagnosis L97.221 Non-pressure chronic ulcer of left calf limited to breakdown of skin 08/15/2017 No Yes L97.321 Non-pressure chronic ulcer of left ankle limited to breakdown of skin 07/01/2018 No Yes L97.521 Non-pressure chronic ulcer of other part of left foot limited to breakdown of 02/17/2019 No Yes skin L97.211 Non-pressure chronic ulcer of right calf limited to breakdown of skin 07/21/2019 No Yes E11.621 Type 2 diabetes mellitus with foot ulcer 04/17/2016 No Yes Inactive Problems ICD-10 Code Description Active Date Inactive Date L89.613 Pressure ulcer of right heel, stage 3 04/17/2016 04/17/2016 L03.032 Cellulitis of left toe 01/06/2019 01/06/2019 Q46.962 Pressure ulcer of left heel, stage 3 12/04/2016 12/04/2016 I25.119 Atherosclerotic heart disease of native coronary artery with unspecified angina 04/17/2016 04/17/2016 pectoris L97.821 Non-pressure chronic ulcer of other part of left lower leg limited to breakdown of skin 01/06/2019 01/06/2019 S51.811D Laceration without foreign body of right forearm, subsequent encounter 10/22/2017 10/22/2017 I50.31 Acute diastolic (congestive) heart failure 04/17/2016 04/17/2016 L03.116 Cellulitis of left lower limb 12/24/2017 12/24/2017 L89.620 Pressure ulcer of left heel, unstageable 07/21/2019 07/21/2019 S80.211D Abrasion, right knee, subsequent encounter 08/18/2019 08/18/2019 Resolved Problems ICD-10 Code Description Active Date Resolved Date L89.512 Pressure ulcer of right ankle, stage 2 04/17/2016 04/17/2016 L89.522 Pressure ulcer of left  ankle, stage 2 04/17/2016 04/17/2016 Electronic Signature(s) Signed: 04/26/2020 4:50:25 PM By: Baltazar Najjar MD Entered By: Baltazar Najjar on 04/26/2020 09:53:46 -------------------------------------------------------------------------------- Progress Note Details Patient Name: Date of Service: Maude Leriche F. 04/26/2020 9:30 A M Medical Record Number: [id number] Patient Account Number: [id number] Date of Birth/Sex: Treating RN: [birth year] (75 y.o. Charlean Merl, [name] Primary Care Provider: [place], [name] Other Clinician: Referring Provider: Treating Provider/Extender: Lyndon Chenoweth [place], [name] Weeks in Treatment: 210 Subjective History of Present Illness (HPI) The following HPI elements were documented for the patient's wound: Location: On the left and right lateral forefoot which has been there for about 6 months Quality: Patient reports No Pain. Severity: Patient states wound(s) are getting worse. Duration: Patient has had the wound for > 6 months prior to seeking treatment at the wound center Context: The wound would happen gradually Modifying Factors: Patient wound(s)/ulcer(s) are worsening due to :continual drainage from the wound Associated Signs and Symptoms: Patient reports having increase discharge. This patient returns after being seen here till the end of August and he was lost to follow-up. he has been quite debilitated laying in bed most of the time and his condition has deteriorated significantly. He has multiple ulcerations on the heel lateral forefoot and some of his toes. ======== Old notes: 75 year old male known to our practice when he was seen here in February and March and was lost to follow-up when he was admitted to hospital  with various medical problems including coronary artery disease and a stroke. Now returns with the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 posterior wound is about the same, one of the wounds is healed on the right knee. 6/8; 2-week follow-up. His left lateral calf wound is now split into 2. He has an area on the left heel which is a pressure ulcer which  I think is improving he also had the wrap injury areas on the crease of his left ankle/foot that are better. Still an area on the right heel that is also improved we have been using PolyMem Ag 6/22; 2-week follow-up. The left lateral calf wound measures about the same. ooLeft heel is closed ooRight heel still open but looks better. ooAssorted wounds on the dorsal foot and ankle 1 of these requires debridement small wound with some depth. ooWe have been using PolyMem Ag to all wound areas 7/13; ooLeft heel is closed ooRight heel is open but looks smaller ooSmall area on the right lateral calf looks smaller ooSmattering of wounds on the dorsal left foot and ankle. All of these are superficial ooThe substantial area on the left lateral mid tibia. Necrotic debris on the surface. I really do not completely understand this wound. Not making any progress. We have been using PolyMem Ag to all the wounds 8/10-Patient returns at 1 month, the left dorsal foot and ankle wounds are about the same, the left lateral wound on the leg  definitely bigger, using PolyMem to all the wounds except the left lateral leg wound where Iodoflex is being tried. Overall these wounds seem to fluctuate in the progress with no clear resolution 8/31; I have not seen this man in over a month and a half. His area on the left leg is much more substantial necrotic surface much longer. Our staff noted purulent drainage. He still has breakdown in the left foot and ankle I am not quite sure how to explain this. ooOn the right side his right lateral heel is just about closed closed. Small area on the right lateral calf. ooHis left heel is also closed 9/13; 2-week follow-up. Because of the increase in size and the condition of the wound on the left lateral leg I did a PCR culture on this/deep tissue culture last time this showed methicillin resistant staph aureus and group B strep. I use clindamycin, the patient is allergic to Septra and there was a tetracycline resistant gene detected as well 9/27; 2-week follow-up. Left lateral leg looks stable to improved slightly smaller. He has a scattering of areas on his left dorsal foot but nothing on the left heel. On the right heel a small open area on the lateral aspect and also on the lateral part of his lower leg. We have been using silver alginate under compression As I have often done in this man I feel no reviewed his arterial studies which were last looked at in June 2020 at that point he had an ABI in the right of 0.93 monophasic waveforms however but the great toe pressure was 110 with a TBI of 0.89 on the left his ABI was 0.84 with a great toe pressure of 120 and a TBI of 0.97. This was indicative of mild arterial disease 10/26; the patient arrives with the wounds on his bilateral lower extremities looking as good as I have seen him in quite some time. First of all the lateral heels are both healed. There is callus here however no open wounds. The substantial area on his left upper lateral tibia which  has been so refractory looks a lot better. He still has multiple small open areas on the dorsal left foot and ankle. One of these at the crease of the foot and ankle is actually quite deep and has debris in the center. On the right leg he only has one small open area on the right lateral  leg this is just about closed We apparently had the order of silver alginate they come in with polymen Ag. However given the fact that things look so much better I am going to continue with the latter 04/26/2020; patient returns to the clinic after about a 9 to 10-week hiatus. I am not exactly sure why. The patient says he had a cold and was sick. He was apparently tested for Covid but was negative. He literally dropped off our schedule. He arrives back in clinic with a multitude of wounds especially on the left dorsal foot and lower leg. There are also areas on the right dorsal foot and the right lower leg. Fortunately the pressure ulcers on the bilateral lateral heels of once again closed. Again the history here is vague. Apparently everything is deteriorated on the left greater than right over the last 2 to 3 weeks. His daughter wondered whether they were not wrapping this properly they have been applying topical antibiotic and a kerlix Coban wrap. He absolutely denies any pain Objective Constitutional Sitting or standing Blood Pressure is within target range for patient.. Pulse regular and within target range for patient.Marland Kitchen Respirations regular, non-labored and within target range.. Temperature is normal and within the target range for the patient.Marland Kitchen Appears in no distress. Vitals Time Taken: 9:14 AM, Height: 74 in, Source: Stated, Weight: 150 lbs, Source: Stated, BMI: 19.3, Temperature: 98.1 F, Pulse: 76 bpm, Respiratory Rate: 18 breaths/min, Blood Pressure: 118/74 mmHg, Capillary Blood Glucose: 117 mg/dl. General Notes: glucose per pt report this am Cardiovascular Popliteal pulse palpable on the left. Pedal  pulses are reduced but palpable at the dorsalis pedis and posterior tibial bilaterally. General Notes: Wound exam; everything is much worse than what I remember 9 or 10 weeks ago. At that point things look quite good especially the refractory wound on his right lateral tibia. This particular wound actually is gotten smaller. Some necrotic debris on the surface I debrided with a #5 curette. Once more surprising is the extent of the small ulcers on his left dorsal foot left dorsal ankle left lower leg to a lesser extent on the right dorsal foot and right lower leg. The large area on the left dorsal foot required debridement with a #15 scalpel and pickups. He has exposed tendon underneath this ooHe has no open area on the lateral heels which have been his problematic areas from when he first came into this clinic Integumentary (Hair, Skin) Wound #14 status is Open. Original cause of wound was Gradually Appeared. The wound is located on the Left,Lateral Lower Leg. The wound measures 8.4cm length x 1.2cm width x 0.2cm depth; 7.917cm^2 area and 1.583cm^3 volume. There is Fat Layer (Subcutaneous Tissue) exposed. There is no tunneling or undermining noted. There is a medium amount of serosanguineous drainage noted. The wound margin is flat and intact. There is medium (34-66%) red, pink, friable granulation within the wound bed. There is a medium (34-66%) amount of necrotic tissue within the wound bed including Eschar and Adherent Slough. Wound #25 status is Open. Original cause of wound was Gradually Appeared. The wound is located on the Right,Lateral Lower Leg. The wound measures 2cm length x 4cm width x 0.1cm depth; 6.283cm^2 area and 0.628cm^3 volume. There is Fat Layer (Subcutaneous Tissue) exposed. There is no tunneling or undermining noted. There is a medium amount of serosanguineous drainage noted. The wound margin is flat and intact. There is large (67-100%) red granulation within the wound bed. There  is a small (1-33%)  amount of necrotic tissue within the wound bed including Adherent Slough. Wound #26 status is Open. Original cause of wound was Gradually Appeared. The wound is located on the Left,Dorsal Foot. The wound measures 6.4cm length x 5cm width x 0.1cm depth; 25.133cm^2 area and 2.513cm^3 volume. There is Fat Layer (Subcutaneous Tissue) exposed. There is no tunneling or undermining noted. There is a medium amount of serosanguineous drainage noted. The wound margin is flat and intact. There is medium (34-66%) red granulation within the wound bed. There is a medium (34-66%) amount of necrotic tissue within the wound bed including Eschar and Adherent Slough. Wound #28 status is Open. Original cause of wound was Gradually Appeared. The wound is located on the Right,Dorsal Foot. The wound measures 0.6cm length x 2.2cm width x 0.1cm depth; 1.037cm^2 area and 0.104cm^3 volume. There is Fat Layer (Subcutaneous Tissue) exposed. There is no tunneling noted. There is a medium amount of serosanguineous drainage noted. The wound margin is flat and intact. There is medium (34-66%) pink granulation within the wound bed. There is a medium (34-66%) amount of necrotic tissue within the wound bed including Adherent Slough. Wound #29 status is Open. Original cause of wound was Gradually Appeared. The wound is located on the Right Calcaneus. The wound measures 1.1cm length x 2.1cm width x 0.1cm depth; 1.814cm^2 area and 0.181cm^3 volume. There is Fat Layer (Subcutaneous Tissue) exposed. There is no tunneling or undermining noted. There is a small amount of serous drainage noted. The wound margin is flat and intact. There is large (67-100%) red granulation within the wound bed. There is a small (1-33%) amount of necrotic tissue within the wound bed including Adherent Slough. Wound #30 status is Open. Original cause of wound was Gradually Appeared. The wound is located on the Left,Anterior Lower Leg. The wound  measures 1.5cm length x 1.4cm width x 0.1cm depth; 1.649cm^2 area and 0.165cm^3 volume. There is Fat Layer (Subcutaneous Tissue) exposed. There is no tunneling or undermining noted. There is a small amount of serosanguineous drainage noted. The wound margin is flat and intact. There is large (67-100%) red granulation within the wound bed. There is no necrotic tissue within the wound bed. Wound #31 status is Open. Original cause of wound was Gradually Appeared. The wound is located on the Right Knee. The wound measures 1.4cm length x 1.3cm width x 0.1cm depth; 1.429cm^2 area and 0.143cm^3 volume. There is Fat Layer (Subcutaneous Tissue) exposed. There is no tunneling or undermining noted. There is a medium amount of serosanguineous drainage noted. The wound margin is flat and intact. There is large (67-100%) red granulation within the wound bed. There is no necrotic tissue within the wound bed. 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 There was a Excisional Skin/Subcutaneous Tissue Debridement with a total area of 10.08 sq cm performed by Maxwell Caul., MD. With the following instrument(s): Curette to remove Viable and Non-Viable tissue/material. Material removed includes Subcutaneous Tissue and Slough and after achieving pain control using Lidocaine. No specimens were taken. A time  out was conducted at 09:50, prior to the start of the procedure. A Minimum amount of  bleeding was controlled with Pressure. The procedure was tolerated well with a pain level of 0 throughout and a pain level of 0 following the procedure. Post Debridement Measurements: 8.4cm length x 1.2cm width x 0.2cm depth; 1.583cm^3 volume. Character of Wound/Ulcer Post Debridement is improved. Severity of Tissue Post Debridement is: Fat layer exposed. Post procedure Diagnosis Wound #14: Same as Pre-Procedure Wound #26 Pre-procedure diagnosis of Wound #26 is a Diabetic Wound/Ulcer of the Lower Extremity located on the Left,Dorsal Foot .Severity of Tissue Pre Debridement is: Fat layer exposed. There was a Excisional Skin/Subcutaneous Tissue/Muscle Debridement with a total area of 32 sq cm performed by Maxwell Caul., MD. With the following instrument(s): Blade, and Forceps to remove Viable and Non-Viable tissue/material. Material removed includes T endon, Subcutaneous Tissue, and Slough after achieving pain control using Lidocaine. No specimens were taken. A time out was conducted at 09:48, prior to the start of the procedure. A Minimum amount of bleeding was controlled with Pressure. The procedure was tolerated well with a pain level of 0 throughout and a pain level of 0 following the procedure. Post Debridement Measurements: 6.4cm length x 5cm width x 0.1cm depth; 2.513cm^3 volume. Character of Wound/Ulcer Post Debridement is improved. Severity of Tissue Post Debridement is: Fat layer exposed. Post procedure Diagnosis Wound #26: Same as Pre-Procedure Plan Follow-up Appointments: Return Appointment in 1 week. Bathing/ Shower/ Hygiene: May shower with protection but do not get wound dressing(s) wet. Edema Control - Lymphedema / SCD / Other: Elevate legs to the level of the heart or above for 30 minutes daily and/or when sitting, a frequency of: Avoid standing for long periods of time. Off-Loading: Other: - multipodus boots to both feet The following medication(s) was prescribed:  lidocaine topical 5 % gel gel topical was prescribed at facility WOUND #14: - Lower Leg Wound Laterality: Left, Lateral Cleanser: Soap and Water 1 x Per Week/15 Days Discharge Instructions: May shower and wash wound with dial antibacterial soap and water prior to dressing change. Peri-Wound Care: Sween Lotion (Moisturizing lotion) 1 x Per Week/15 Days Discharge Instructions: Apply moisturizing lotion as directed Prim Dressing: KerraCel Ag Gelling Fiber Dressing, 4x5 in (silver alginate) 1 x Per Week/15 Days ary Discharge Instructions: Apply silver alginate to wound bed as instructed Secondary Dressing: Woven Gauze Sponge, Non-Sterile 4x4 in 1 x Per Week/15 Days Discharge Instructions: Apply over primary dressing as directed. Secondary Dressing: ABD Pad, 5x9 1 x Per Week/15 Days Discharge Instructions: Apply over primary dressing as directed. Secured With: Coban Self-Adherent Wrap 4x5 (in/yd) 1 x Per Week/15 Days Discharge Instructions: Secure with Coban as directed. Secured With: [employer], 4.5x3.1 (in/yd) 1 x Per Week/15 Days Discharge Instructions: Secure with Kerlix as directed. WOUND #25: - Lower Leg Wound Laterality: Right, Lateral Cleanser: Soap and Water 1 x Per Week/15 Days Discharge Instructions: May shower and wash wound with dial antibacterial soap and water prior to dressing change. Peri-Wound Care: Sween Lotion (Moisturizing lotion) 1 x Per Week/15 Days Discharge Instructions: Apply moisturizing lotion as directed Prim Dressing: KerraCel Ag Gelling Fiber Dressing, 4x5 in (silver alginate) 1 x Per Week/15 Days ary Discharge Instructions: Apply silver alginate to wound bed as instructed Secondary Dressing: Woven Gauze Sponge, Non-Sterile 4x4 in 1 x Per Week/15 Days Discharge Instructions: Apply over primary dressing as directed. Secondary Dressing: ABD Pad, 5x9 1 x Per Week/15 Days Discharge Instructions: Apply over primary dressing as  directed. Secured With: Coban  Self-Adherent Wrap 4x5 (in/yd) 1 x Per Week/15 Days Discharge Instructions: Secure with Coban as directed. Secured With: [employer], 4.5x3.1 (in/yd) 1 x Per Week/15 Days Discharge Instructions: Secure with Kerlix as directed. WOUND #26: - Foot Wound Laterality: Dorsal, Left Cleanser: Wound Cleanser 1 x Per Week/7 Days Discharge Instructions: Cleanse the wound with wound cleanser prior to applying a clean dressing using gauze sponges, not tissue or cotton balls. Peri-Wound Care: Sween Lotion (Moisturizing lotion) 1 x Per Week/7 Days Discharge Instructions: Apply moisturizing lotion as directed Prim Dressing: FIBRACOL Plus Dressing, 2x2 in (collagen) 1 x Per Week/7 Days ary Discharge Instructions: Moisten collagen with saline or hydrogel Secondary Dressing: Woven Gauze Sponge, Non-Sterile 4x4 in 1 x Per Week/7 Days Discharge Instructions: Apply over primary dressing as directed. Secondary Dressing: ABD Pad, 5x9 1 x Per Week/7 Days Discharge Instructions: Apply over primary dressing as directed. Secured With: Coban Self-Adherent Wrap 4x5 (in/yd) 1 x Per Week/7 Days Discharge Instructions: Secure with Coban as directed. Secured With: [employer], 4.5x3.1 (in/yd) 1 x Per Week/7 Days Discharge Instructions: Secure with Kerlix as directed. WOUND #28: - Foot Wound Laterality: Dorsal, Right Cleanser: Soap and Water 1 x Per Week/15 Days Discharge Instructions: May shower and wash wound with dial antibacterial soap and water prior to dressing change. Peri-Wound Care: Sween Lotion (Moisturizing lotion) 1 x Per Week/15 Days Discharge Instructions: Apply moisturizing lotion as directed Prim Dressing: KerraCel Ag Gelling Fiber Dressing, 4x5 in (silver alginate) 1 x Per Week/15 Days ary Discharge Instructions: Apply silver alginate to wound bed as instructed Secondary Dressing: Woven Gauze Sponge, Non-Sterile 4x4 in 1 x Per Week/15 Days Discharge Instructions: Apply over primary  dressing as directed. Secondary Dressing: ABD Pad, 5x9 1 x Per Week/15 Days Discharge Instructions: Apply over primary dressing as directed. Secured With: Coban Self-Adherent Wrap 4x5 (in/yd) 1 x Per Week/15 Days Discharge Instructions: Secure with Coban as directed. Secured With: [employer], 4.5x3.1 (in/yd) 1 x Per Week/15 Days Discharge Instructions: Secure with Kerlix as directed. WOUND #29: - Calcaneus Wound Laterality: Right Cleanser: Soap and Water 1 x Per Week/15 Days Discharge Instructions: May shower and wash wound with dial antibacterial soap and water prior to dressing change. Peri-Wound Care: Sween Lotion (Moisturizing lotion) 1 x Per Week/15 Days Discharge Instructions: Apply moisturizing lotion as directed Prim Dressing: KerraCel Ag Gelling Fiber Dressing, 4x5 in (silver alginate) 1 x Per Week/15 Days ary Discharge Instructions: Apply silver alginate to wound bed as instructed Secondary Dressing: Woven Gauze Sponge, Non-Sterile 4x4 in 1 x Per Week/15 Days Discharge Instructions: Apply over primary dressing as directed. Secondary Dressing: ABD Pad, 5x9 1 x Per Week/15 Days Discharge Instructions: Apply over primary dressing as directed. Secured With: Coban Self-Adherent Wrap 4x5 (in/yd) 1 x Per Week/15 Days Discharge Instructions: Secure with Coban as directed. Secured With: [employer], 4.5x3.1 (in/yd) 1 x Per Week/15 Days Discharge Instructions: Secure with Kerlix as directed. WOUND #30: - Lower Leg Wound Laterality: Left, Anterior Cleanser: Soap and Water 1 x Per Week/15 Days Discharge Instructions: May shower and wash wound with dial antibacterial soap and water prior to dressing change. Peri-Wound Care: Sween Lotion (Moisturizing lotion) 1 x Per Week/15 Days Discharge Instructions: Apply moisturizing lotion as directed Prim Dressing: KerraCel Ag Gelling Fiber Dressing, 4x5 in (silver alginate) 1 x Per Week/15 Days ary Discharge Instructions: Apply  silver alginate to wound bed as instructed Secondary Dressing: Woven Gauze Sponge, Non-Sterile 4x4 in 1 x Per Week/15  Days Discharge Instructions: Apply over primary dressing as directed. Secondary Dressing: ABD Pad, 5x9 1 x Per Week/15 Days Discharge Instructions: Apply over primary dressing as directed. Secured With: Coban Self-Adherent Wrap 4x5 (in/yd) 1 x Per Week/15 Days Discharge Instructions: Secure with Coban as directed. Secured With: [employer], 4.5x3.1 (in/yd) 1 x Per Week/15 Days Discharge Instructions: Secure with Kerlix as directed. WOUND #31: - Knee Wound Laterality: Right Cleanser: Soap and Water 1 x Per Week/15 Days Discharge Instructions: May shower and wash wound with dial antibacterial soap and water prior to dressing change. Peri-Wound Care: Sween Lotion (Moisturizing lotion) 1 x Per Week/15 Days Discharge Instructions: Apply moisturizing lotion as directed Prim Dressing: KerraCel Ag Gelling Fiber Dressing, 4x5 in (silver alginate) 1 x Per Week/15 Days ary Discharge Instructions: Apply silver alginate to wound bed as instructed Secondary Dressing: Woven Gauze Sponge, Non-Sterile 4x4 in 1 x Per Week/15 Days Discharge Instructions: Apply over primary dressing as directed. Secondary Dressing: ABD Pad, 5x9 1 x Per Week/15 Days Discharge Instructions: Apply over primary dressing as directed. Secured With: Coban Self-Adherent Wrap 4x5 (in/yd) 1 x Per Week/15 Days Discharge Instructions: Secure with Coban as directed. Secured With: [employer], 4.5x3.1 (in/yd) 1 x Per Week/15 Days Discharge Instructions: Secure with Kerlix as directed. #1 I am really not sure what to make of this. 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 I suspect given this deterioration I am going to have to see send him to see vascular. 2. I put silver alginate on this and kerlix and Coban while I recreate my  thought processes on this. I am going to ask him to come back more frequently starting with next week. I wonder whether some of this is a wrap injury as well. 3. We put silver collagen on the area on the right dorsal foot which had exposed tendon silver alginate on the rest kerlix and Coban. 4. He is not in any pain I wonder how much sensation he has his last vascular studies were in December 20 and I may need to repeat this and get him seen by vascular interventionalists. The last time he was here his wounds actually look quite good but things have really deteriorated since then. Electronic Signature(s) Signed: 04/26/2020 4:50:25 PM By: Baltazar Najjar MD Entered By: Baltazar Najjar on 04/26/2020 10:03:52 -------------------------------------------------------------------------------- SuperBill Details Patient Name: Date of Service: Maude Leriche F. 04/26/2020 Medical Record Number: [id number] Patient Account Number: [id number] Date of Birth/Sex: Treating RN: 09/15/1945 (76 y.o. Charlean Merl, [name] Primary Care Provider: [place], [name] Other Clinician: Referring Provider: Treating Provider/Extender: Akire Rennert [place], [name] Weeks in Treatment: 210 Diagnosis Coding ICD-10 Codes Code Description E11.621 Type 2 diabetes mellitus with foot ulcer L97.221 Non-pressure chronic ulcer of left calf limited to breakdown of skin L97.321 Non-pressure chronic ulcer of left ankle limited to breakdown of skin L97.521 Non-pressure chronic ulcer of other part of left foot limited to breakdown of skin L97.211 Non-pressure chronic ulcer of right calf limited to breakdown of skin Facility Procedures CPT4 Code: [id number] I Description: 11042 - DEB SUBQ TISSUE 20 SQ CM/< CD-10 Diagnosis Description L97.221 Non-pressure chronic ulcer of left calf limited to breakdown of skin Modifier: 59 Quantity: 1 CPT4 Code: [id number] 11 I Description: 043 - DEB MUSC/FASCIA 20 SQ CM/< CD-10 Diagnosis Description  L97.521 Non-pressure chronic ulcer of other part of left foot limited to breakdown of ski Modifier: 1 n Quantity: Physician  Procedures : CPT4 Code Description Modifier [id number] 11042 - WC PHYS SUBQ TISS 20 SQ CM 59 ICD-10 Diagnosis Description L97.221 Non-pressure chronic ulcer of left calf limited to breakdown of skin Quantity: 1 : [id number] 11043 - WC PHYS DEBR MUSCLE/FASCIA 20 SQ CM ICD-10 Diagnosis Description L97.521 Non-pressure chronic ulcer of other part of left foot limited to breakdown of skin Quantity: 1 Electronic Signature(s) Signed: 04/26/2020 4:50:25 PM By: Baltazar Najjar MD Entered By: Baltazar Najjar on 04/26/2020 10:06:00

## 2020-05-02 ENCOUNTER — Other Ambulatory Visit: Payer: Self-pay | Admitting: Family Medicine

## 2020-05-03 ENCOUNTER — Encounter (HOSPITAL_BASED_OUTPATIENT_CLINIC_OR_DEPARTMENT_OTHER): Payer: Medicare HMO | Admitting: Internal Medicine

## 2020-05-03 ENCOUNTER — Other Ambulatory Visit: Payer: Self-pay

## 2020-05-03 DIAGNOSIS — L89613 Pressure ulcer of right heel, stage 3: Secondary | ICD-10-CM | POA: Diagnosis not present

## 2020-05-03 DIAGNOSIS — E11622 Type 2 diabetes mellitus with other skin ulcer: Secondary | ICD-10-CM | POA: Diagnosis not present

## 2020-05-03 DIAGNOSIS — L97822 Non-pressure chronic ulcer of other part of left lower leg with fat layer exposed: Secondary | ICD-10-CM | POA: Diagnosis not present

## 2020-05-03 DIAGNOSIS — E1122 Type 2 diabetes mellitus with diabetic chronic kidney disease: Secondary | ICD-10-CM | POA: Diagnosis not present

## 2020-05-03 DIAGNOSIS — N312 Flaccid neuropathic bladder, not elsewhere classified: Secondary | ICD-10-CM | POA: Diagnosis not present

## 2020-05-03 DIAGNOSIS — L97812 Non-pressure chronic ulcer of other part of right lower leg with fat layer exposed: Secondary | ICD-10-CM | POA: Diagnosis not present

## 2020-05-03 DIAGNOSIS — L97412 Non-pressure chronic ulcer of right heel and midfoot with fat layer exposed: Secondary | ICD-10-CM | POA: Diagnosis not present

## 2020-05-03 DIAGNOSIS — E11621 Type 2 diabetes mellitus with foot ulcer: Secondary | ICD-10-CM | POA: Diagnosis not present

## 2020-05-03 DIAGNOSIS — L97522 Non-pressure chronic ulcer of other part of left foot with fat layer exposed: Secondary | ICD-10-CM | POA: Diagnosis not present

## 2020-05-03 DIAGNOSIS — L97512 Non-pressure chronic ulcer of other part of right foot with fat layer exposed: Secondary | ICD-10-CM | POA: Diagnosis not present

## 2020-05-03 NOTE — Progress Notes (Signed)
No Wound Description Classification: Grade 1 Wound Margin: Flat and Intact Exudate Amount: Small Exudate Type: Purulent Exudate Color: yellow, brown, green Foul Odor After Cleansing: No Slough/Fibrino No Wound Bed Granulation Amount: Large (67-100%) Exposed Structure Granulation Quality: Pink Fascia Exposed: No Necrotic Amount: Small (1-33%) Fat Layer (Subcutaneous Tissue) Exposed: Yes Necrotic Quality: Adherent Slough Tendon Exposed: No Muscle Exposed: No Joint Exposed: No Bone Exposed: No Treatment Notes Wound #29 (Calcaneus) Wound Laterality: Right Cleanser Soap and Water Discharge Instruction: May shower and wash wound with dial antibacterial soap and water prior to dressing change. Peri-Wound Care Sween Lotion (Moisturizing lotion) Discharge Instruction: Apply moisturizing lotion as directed Topical Primary Dressing KerraCel Ag Gelling Fiber Dressing, 4x5 in (silver alginate) Discharge Instruction: Apply silver alginate to wound bed as instructed Secondary Dressing Woven Gauze Sponge, Non-Sterile 4x4 in Discharge Instruction: Apply over primary dressing as directed. ABD Pad, 5x9 Discharge Instruction: Apply over primary dressing as directed. Secured With [employer] 4x5 (in/yd) Discharge Instruction: Secure with Coban as directed. Kerlix Roll Sterile, 4.5x3.1 (in/yd) Discharge  Instruction: Secure with Kerlix as directed. Compression Wrap Compression Stockings Add-Ons Electronic Signature(s) Signed: 05/03/2020 6:04:09 PM By: Baruch Gouty RN, BSN Entered By: Baruch Gouty on 05/03/2020 09:02:09 -------------------------------------------------------------------------------- Wound Assessment Details Patient Name: Date of Service: [name] Foley F. 05/03/2020 8:30 A M Medical Record Number: [id number] Patient Account Number: [id number] Date of Birth/Sex: Treating RN: 31-Jan-1946 (75 y.o. [name] Primary Care Torunn Chancellor: Other Clinician: [name], [name] Referring Abena Erdman: Treating Rolan Wrightsman/Extender: [name], [name] [name], [name] Weeks in Treatment: 211 Wound Status Wound Number: 30 Primary Diabetic Wound/Ulcer of the Lower Extremity Etiology: Wound Location: Left, Anterior Lower Leg Wound Open Wounding Event: Gradually Appeared Status: Date Acquired: 04/26/2020 Comorbid Anemia, Sleep Apnea, Arrhythmia, Congestive Heart Failure, Weeks Of Treatment: 1 History: Coronary Artery Disease, Hypertension, Myocardial Infarction, Clustered Wound: No Type II Diabetes, Gout, Neuropathy Wound Measurements Length: (cm) 0.9 Width: (cm) 0.6 Depth: (cm) 0.1 Area: (cm) 0.424 Volume: (cm) 0.042 % Reduction in Area: 74.3% % Reduction in Volume: 74.5% Epithelialization: Small (1-33%) Tunneling: No Undermining: No Wound Description Classification: Grade 1 Wound Margin: Flat and Intact Exudate Amount: Small Exudate Type: Purulent Exudate Color: yellow, brown, green Foul Odor After Cleansing: No Slough/Fibrino No Wound Bed Granulation Amount: Large (67-100%) Exposed Structure Granulation Quality: Pink Fascia Exposed: No Necrotic Amount: None Present (0%) Fat Layer (Subcutaneous Tissue) Exposed: Yes Tendon Exposed: No Muscle Exposed: No Joint Exposed: No Bone Exposed: No Treatment Notes Wound #30 (Lower Leg) Wound Laterality: Left,  Anterior Cleanser Soap and Water Discharge Instruction: May shower and wash wound with dial antibacterial soap and water prior to dressing change. Peri-Wound Care Sween Lotion (Moisturizing lotion) Discharge Instruction: Apply moisturizing lotion as directed Topical Primary Dressing KerraCel Ag Gelling Fiber Dressing, 4x5 in (silver alginate) Discharge Instruction: Apply silver alginate to wound bed as instructed Secondary Dressing Woven Gauze Sponge, Non-Sterile 4x4 in Discharge Instruction: Apply over primary dressing as directed. ABD Pad, 5x9 Discharge Instruction: Apply over primary dressing as directed. Secured With [employer] 4x5 (in/yd) Discharge Instruction: Secure with Coban as directed. Kerlix Roll Sterile, 4.5x3.1 (in/yd) Discharge Instruction: Secure with Kerlix as directed. Compression Wrap Compression Stockings Add-Ons Electronic Signature(s) Signed: 05/03/2020 6:04:09 PM By: Baruch Gouty RN, BSN Entered By: Baruch Gouty on 05/03/2020 09:02:36 -------------------------------------------------------------------------------- Wound Assessment Details Patient Name: Date of Service: [name] Foley F. 05/03/2020 8:30 A M Medical Record Number: [id number] Patient Account Number: [id number] Date of Birth/Sex: Treating RN: 04-Dec-1945 (75 y.o. [name] [name],  [name] [name], [name] [name] ([id number]) Visit Report for 05/03/2020 Arrival Information Details Patient Name: Date of Service: [name] Grapes [facility] F. 05/03/2020 8:30 A M Medical Record Number: [id number] Patient Account Number: [id number] Date of Birth/Sex: Treating RN: [birth year] (75 y.o. [name] [name], Vaughan Basta Primary Care Maegan Buller: [name], [name] Other Clinician: Referring Konstantin Lehnen: Treating Maxim Bedel/Extender: [name], [name] [name], [name] Weeks in Treatment: 211 Visit Information History Since Last Visit Added or deleted any medications: No Patient Arrived: Wheel Chair Any new allergies or adverse reactions: No Arrival Time: 08:32 Had a fall or experienced change in No Accompanied By: dgt activities of daily living that may affect Transfer Assistance: None risk of falls: Patient Identification Verified: Yes Signs or symptoms of abuse/neglect since last visito No Secondary Verification Process Completed: Yes Hospitalized since last visit: No Patient Requires Transmission-Based Precautions: No Implantable device outside of the clinic excluding No Patient Has Alerts: Yes cellular tissue based products placed in the center Patient Alerts: Patient on Blood Thinner since last visit: left ABI 1.0; rt ABI 1.09 Has Dressing in Place as Prescribed: Yes Has Compression in Place as Prescribed: Yes Pain Present Now: No Electronic Signature(s) Signed: 05/03/2020 6:04:09 PM By: Baruch Gouty RN, BSN Entered By: Baruch Gouty on 05/03/2020 08:38:39 -------------------------------------------------------------------------------- Encounter Discharge Information Details Patient Name: Date of Service: [name] Foley F. 05/03/2020 8:30 A M Medical Record Number: [id number] Patient Account Number: [id number] Date of Birth/Sex: Treating RN: May 03, 1945 (75 y.o. [name] Primary Care Hyman Crossan: [name], [name] Other Clinician: Referring Adahlia Stembridge: Treating Drayson Dorko/Extender: [name], [name] [name],  [name] Weeks in Treatment: 211 Encounter Discharge Information Items Post Procedure Vitals Discharge Condition: Stable Temperature (F): 98.2 Ambulatory Status: Wheelchair Pulse (bpm): 76 Discharge Destination: Home Respiratory Rate (breaths/min): 18 Transportation: Private Auto Blood Pressure (mmHg): 125/67 Accompanied By: self Schedule Follow-up Appointment: Yes Clinical Summary of Care: Electronic Signature(s) Signed: 05/03/2020 2:51:26 PM By: Deon Pilling Entered By: Deon Pilling on 05/03/2020 09:56:23 -------------------------------------------------------------------------------- Lower Extremity Assessment Details Patient Name: Date of Service: [name] Grapes [facility] F. 05/03/2020 8:30 A M Medical Record Number: [id number] Patient Account Number: [id number] Date of Birth/Sex: Treating RN: March 11, 1946 (75 y.o. [name] Primary Care Jewelz Ricklefs: [name], [name] Other Clinician: Referring Cassandria Drew: Treating Kewanna Kasprzak/Extender: [name], [name] [name], [name] Weeks in Treatment: 211 Edema Assessment Assessed: Shirlyn Goltz: No] [Right: No] Edema: [Left: No] [Right: No] Calf Left: Right: Point of Measurement: 36 cm From Medial Instep 23.8 cm 25 cm Ankle Left: Right: Point of Measurement: 11 cm From Medial Instep 18 cm 18.2 cm Vascular Assessment Pulses: Dorsalis Pedis Palpable: [Left:No] [Right:No] Electronic Signature(s) Signed: 05/03/2020 6:04:09 PM By: Baruch Gouty RN, BSN Entered By: Baruch Gouty on 05/03/2020 09:05:24 -------------------------------------------------------------------------------- Multi Wound Chart Details Patient Name: Date of Service: [name] Foley F. 05/03/2020 8:30 A M Medical Record Number: [id number] Patient Account Number: [id number] Date of Birth/Sex: Treating RN: [birth year] (75 y.o. [name], [name] Primary Care Hilja Kintzel: [name], [name] Other Clinician: Referring Amarius Toto: Treating Cosette Prindle/Extender: [name], [name] [name],  [name] Weeks in Treatment: 211 Vital Signs Height(in): 74 Capillary Blood Glucose(mg/dl): 123 Weight(lbs): 150 Pulse(bpm): 85 Body Mass Index(BMI): 19 Blood Pressure(mmHg): 125/67 Temperature(F): 98.2 Respiratory Rate(breaths/min): 18 Photos: [14:No Photos Left, Lateral Lower Leg] [25:No Photos Right, Lateral Lower Leg] [26:No Photos Left, Dorsal Foot] Wound Location: [14:Gradually Appeared] [25:Gradually Appeared] [26:Gradually Appeared] Wounding Event: [14:Diabetic Wound/Ulcer of the Lower] [25:Venous Leg Ulcer] [26:Diabetic Wound/Ulcer of the Lower] Primary Etiology: [14:Extremity Anemia, Sleep Apnea, Arrhythmia,] [25:Anemia, Sleep Apnea, Arrhythmia,] [26:Extremity Anemia, Sleep Apnea, Arrhythmia,] Comorbid History: [14:Congestive Heart Failure, Coronary  [name] [name], [name] [name] ([id number]) Visit Report for 05/03/2020 Arrival Information Details Patient Name: Date of Service: [name] Grapes [facility] F. 05/03/2020 8:30 A M Medical Record Number: [id number] Patient Account Number: [id number] Date of Birth/Sex: Treating RN: [birth year] (75 y.o. [name] [name], Vaughan Basta Primary Care Maegan Buller: [name], [name] Other Clinician: Referring Konstantin Lehnen: Treating Maxim Bedel/Extender: [name], [name] [name], [name] Weeks in Treatment: 211 Visit Information History Since Last Visit Added or deleted any medications: No Patient Arrived: Wheel Chair Any new allergies or adverse reactions: No Arrival Time: 08:32 Had a fall or experienced change in No Accompanied By: dgt activities of daily living that may affect Transfer Assistance: None risk of falls: Patient Identification Verified: Yes Signs or symptoms of abuse/neglect since last visito No Secondary Verification Process Completed: Yes Hospitalized since last visit: No Patient Requires Transmission-Based Precautions: No Implantable device outside of the clinic excluding No Patient Has Alerts: Yes cellular tissue based products placed in the center Patient Alerts: Patient on Blood Thinner since last visit: left ABI 1.0; rt ABI 1.09 Has Dressing in Place as Prescribed: Yes Has Compression in Place as Prescribed: Yes Pain Present Now: No Electronic Signature(s) Signed: 05/03/2020 6:04:09 PM By: Baruch Gouty RN, BSN Entered By: Baruch Gouty on 05/03/2020 08:38:39 -------------------------------------------------------------------------------- Encounter Discharge Information Details Patient Name: Date of Service: [name] Foley F. 05/03/2020 8:30 A M Medical Record Number: [id number] Patient Account Number: [id number] Date of Birth/Sex: Treating RN: May 03, 1945 (75 y.o. [name] Primary Care Hyman Crossan: [name], [name] Other Clinician: Referring Adahlia Stembridge: Treating Drayson Dorko/Extender: [name], [name] [name],  [name] Weeks in Treatment: 211 Encounter Discharge Information Items Post Procedure Vitals Discharge Condition: Stable Temperature (F): 98.2 Ambulatory Status: Wheelchair Pulse (bpm): 76 Discharge Destination: Home Respiratory Rate (breaths/min): 18 Transportation: Private Auto Blood Pressure (mmHg): 125/67 Accompanied By: self Schedule Follow-up Appointment: Yes Clinical Summary of Care: Electronic Signature(s) Signed: 05/03/2020 2:51:26 PM By: Deon Pilling Entered By: Deon Pilling on 05/03/2020 09:56:23 -------------------------------------------------------------------------------- Lower Extremity Assessment Details Patient Name: Date of Service: [name] Grapes [facility] F. 05/03/2020 8:30 A M Medical Record Number: [id number] Patient Account Number: [id number] Date of Birth/Sex: Treating RN: March 11, 1946 (75 y.o. [name] Primary Care Jewelz Ricklefs: [name], [name] Other Clinician: Referring Cassandria Drew: Treating Kewanna Kasprzak/Extender: [name], [name] [name], [name] Weeks in Treatment: 211 Edema Assessment Assessed: Shirlyn Goltz: No] [Right: No] Edema: [Left: No] [Right: No] Calf Left: Right: Point of Measurement: 36 cm From Medial Instep 23.8 cm 25 cm Ankle Left: Right: Point of Measurement: 11 cm From Medial Instep 18 cm 18.2 cm Vascular Assessment Pulses: Dorsalis Pedis Palpable: [Left:No] [Right:No] Electronic Signature(s) Signed: 05/03/2020 6:04:09 PM By: Baruch Gouty RN, BSN Entered By: Baruch Gouty on 05/03/2020 09:05:24 -------------------------------------------------------------------------------- Multi Wound Chart Details Patient Name: Date of Service: [name] Foley F. 05/03/2020 8:30 A M Medical Record Number: [id number] Patient Account Number: [id number] Date of Birth/Sex: Treating RN: [birth year] (75 y.o. [name], [name] Primary Care Hilja Kintzel: [name], [name] Other Clinician: Referring Amarius Toto: Treating Cosette Prindle/Extender: [name], [name] [name],  [name] Weeks in Treatment: 211 Vital Signs Height(in): 74 Capillary Blood Glucose(mg/dl): 123 Weight(lbs): 150 Pulse(bpm): 85 Body Mass Index(BMI): 19 Blood Pressure(mmHg): 125/67 Temperature(F): 98.2 Respiratory Rate(breaths/min): 18 Photos: [14:No Photos Left, Lateral Lower Leg] [25:No Photos Right, Lateral Lower Leg] [26:No Photos Left, Dorsal Foot] Wound Location: [14:Gradually Appeared] [25:Gradually Appeared] [26:Gradually Appeared] Wounding Event: [14:Diabetic Wound/Ulcer of the Lower] [25:Venous Leg Ulcer] [26:Diabetic Wound/Ulcer of the Lower] Primary Etiology: [14:Extremity Anemia, Sleep Apnea, Arrhythmia,] [25:Anemia, Sleep Apnea, Arrhythmia,] [26:Extremity Anemia, Sleep Apnea, Arrhythmia,] Comorbid History: [14:Congestive Heart Failure, Coronary  Vaughan Basta Primary Care Adeoluwa Silvers: [name], [name] Other Clinician: Referring Wannetta Langland: Treating Zelpha Messing/Extender: [name], [name] [name], [name] Weeks in Treatment: 211 Wound Status Wound Number: 31 Primary Abrasion Etiology: Wound Location: Right Knee Wound Open Wounding Event: Gradually Appeared Status: Date Acquired: 04/24/2020 Comorbid Anemia, Sleep Apnea, Arrhythmia, Congestive Heart Failure, Weeks Of Treatment: 1 History: Coronary Artery Disease, Hypertension, Myocardial Infarction, Clustered Wound: No Type II Diabetes, Gout, Neuropathy Wound Measurements Length: (cm) 1.4 Width: (cm)  1.4 Depth: (cm) 0.1 Area: (cm) 1.539 Volume: (cm) 0.154 % Reduction in Area: -7.7% % Reduction in Volume: -7.7% Epithelialization: Small (1-33%) Tunneling: No Undermining: No Wound Description Classification: Full Thickness Without Exposed Support Structu Wound Margin: Flat and Intact Exudate Amount: Medium Exudate Type: Sanguinous Exudate Color: red res Wound Bed Granulation Amount: Large (67-100%) Exposed Structure Granulation Quality: Red, Pink Fascia Exposed: No Necrotic Amount: None Present (0%) Fat Layer (Subcutaneous Tissue) Exposed: Yes Tendon Exposed: No Muscle Exposed: No Joint Exposed: No Bone Exposed: No Treatment Notes Wound #31 (Knee) Wound Laterality: Right Cleanser Soap and Water Discharge Instruction: May shower and wash wound with dial antibacterial soap and water prior to dressing change. Peri-Wound Care Sween Lotion (Moisturizing lotion) Discharge Instruction: Apply moisturizing lotion as directed Topical Primary Dressing KerraCel Ag Gelling Fiber Dressing, 4x5 in (silver alginate) Discharge Instruction: Apply silver alginate to wound bed as instructed Secondary Dressing ComfortFoam Border, 4x4 in (silicone border) Discharge Instruction: Apply over primary dressing as directed. Secured With Compression Wrap Compression Stockings Environmental education officer) Signed: 05/03/2020 6:04:09 PM By: Baruch Gouty RN, BSN Entered By: Baruch Gouty on 05/03/2020 09:02:55 -------------------------------------------------------------------------------- Wound Assessment Details Patient Name: Date of Service: [name] Foley F. 05/03/2020 8:30 A M Medical Record Number: [id number] Patient Account Number: [id number] Date of Birth/Sex: Treating RN: 1945/09/21 (75 y.o. [name] Primary Care Bogdan Vivona: [name], [name] Other Clinician: Referring Aidyn Sportsman: Treating Camesha Farooq/Extender: [name], [name] [name], [name] Weeks in Treatment:  211 Wound Status Wound Number: 32 Primary Diabetic Wound/Ulcer of the Lower Extremity Etiology: Wound Location: Left, Distal, Dorsal Foot Wound Open Wounding Event: Gradually Appeared Status: Date Acquired: 05/03/2020 Comorbid Anemia, Sleep Apnea, Arrhythmia, Congestive Heart Failure, Weeks Of Treatment: 0 History: Coronary Artery Disease, Hypertension, Myocardial Infarction, Clustered Wound: No Type II Diabetes, Gout, Neuropathy Wound Measurements Length: (cm) 1.3 Width: (cm) 1.7 Depth: (cm) 0.1 Area: (cm) 1.736 Volume: (cm) 0.174 % Reduction in Area: % Reduction in Volume: Epithelialization: Small (1-33%) Tunneling: No Undermining: No Wound Description Classification: Grade 2 Wound Margin: Flat and Intact Exudate Amount: Small Exudate Type: Sanguinous Exudate Color: red Foul Odor After Cleansing: No Slough/Fibrino No Wound Bed Granulation Amount: Small (1-33%) Exposed Structure Granulation Quality: Red Fascia Exposed: No Necrotic Amount: Large (67-100%) Fat Layer (Subcutaneous Tissue) Exposed: Yes Necrotic Quality: Eschar Tendon Exposed: No Muscle Exposed: No Joint Exposed: No Bone Exposed: No Treatment Notes Wound #32 (Foot) Wound Laterality: Dorsal, Left, Distal Cleanser Peri-Wound Care Topical Primary Dressing Promogran Prisma Matrix, 4.34 (sq in) (silver collagen) Discharge Instruction: Moisten collagen with saline or hydrogel Secondary Dressing Woven Gauze Sponge, Non-Sterile 4x4 in Discharge Instruction: Apply over primary dressing as directed. Optifoam Non-Adhesive Dressing, 4x4 in Discharge Instruction: Apply over primary dressing as directed. Secured With Compression Wrap Kerlix Roll 4.5x3.1 (in/yd) Discharge Instruction: Apply Kerlix and Coban compression as directed. Coban Self-Adherent Wrap 4x5 (in/yd) Discharge Instruction: Apply over Kerlix as directed. Compression Stockings Add-Ons Electronic Signature(s) Signed: 05/03/2020 6:04:09 PM  By: Baruch Gouty RN, BSN Entered By: Baruch Gouty on 05/03/2020 08:58:34 -------------------------------------------------------------------------------- [place] Details Patient Name: Date of  Tana Conch F. 05/03/2020 8:30 A M Medical Record Number: [id number] Patient Account Number: [id number] Date of Birth/Sex: Treating RN: May 30, 1945 (75 y.o. [name] Primary Care Jamas Jaquay: [name], [name] Other Clinician: Referring Tayonna Bacha: Treating Osiah Haring/Extender: [name], [name] [name], [name] Weeks in Treatment: 211 Wound Status Wound Number: 25 Primary Venous Leg Ulcer Etiology: Wound Location: Right, Lateral Lower Leg Wound Open Wounding Event: Gradually Appeared Status: Date Acquired: 07/06/2019 Comorbid Anemia, Sleep Apnea, Arrhythmia, Congestive Heart Failure, Weeks Of Treatment: 41 History: Coronary Artery Disease, Hypertension, Myocardial Infarction, Clustered Wound: No Type II Diabetes, Gout, Neuropathy Wound Measurements Length: (cm) 1.3 Width: (cm) 0.5 Depth: (cm) 0.1 Area: (cm) 0.511 Volume: (cm) 0.051 % Reduction in Area:  81.3% % Reduction in Volume: 81.3% Epithelialization: Medium (34-66%) Tunneling: No Undermining: No Wound Description Classification: Full Thickness Without Exposed Support Structures Wound Margin: Flat and Intact Exudate Amount: Small Exudate Type: Serosanguineous Exudate Color: red, brown Foul Odor After Cleansing: No Slough/Fibrino No Wound Bed Granulation Amount: Large (67-100%) Exposed Structure Granulation Quality: Red Fascia Exposed: No Necrotic Amount: None Present (0%) Fat Layer (Subcutaneous Tissue) Exposed: Yes Tendon Exposed: No Muscle Exposed: No Joint Exposed: No Bone Exposed: No Treatment Notes Wound #25 (Lower Leg) Wound Laterality: Right, Lateral Cleanser Soap and Water Discharge Instruction: May shower and wash wound with dial antibacterial soap and water prior to dressing change. Peri-Wound Care Sween Lotion (Moisturizing lotion) Discharge Instruction: Apply moisturizing lotion as directed Topical Primary Dressing KerraCel Ag Gelling Fiber Dressing, 4x5 in (silver alginate) Discharge Instruction: Apply silver alginate to wound bed as instructed Secondary Dressing Woven Gauze Sponge, Non-Sterile 4x4 in Discharge Instruction: Apply over primary dressing as directed. ABD Pad, 5x9 Discharge Instruction: Apply over primary dressing as directed. Secured With [employer] 4x5 (in/yd) Discharge Instruction: Secure with Coban as directed. Kerlix Roll Sterile, 4.5x3.1 (in/yd) Discharge Instruction: Secure with Kerlix as directed. Compression Wrap Compression Stockings Add-Ons Electronic Signature(s) Signed: 05/03/2020 6:04:09 PM By: Baruch Gouty RN, BSN Entered By: Baruch Gouty on 05/03/2020 09:00:59 -------------------------------------------------------------------------------- Wound Assessment Details Patient Name: Date of Service: [name] Foley F. 05/03/2020 8:30 A M Medical Record Number: [id number] Patient Account Number:  [id number] Date of Birth/Sex: Treating RN: 09-Feb-1946 (75 y.o. [name] Primary Care Ashyia Schraeder: [name], [name] Other Clinician: Referring Naveen Lorusso: Treating Takaya Hyslop/Extender: [name], [name] [name], [name] Weeks in Treatment: 211 Wound Status Wound Number: 26 Primary Diabetic Wound/Ulcer of the Lower Extremity Etiology: Wound Location: Left, Dorsal Foot Wound Open Wounding Event: Gradually Appeared Status: Date Acquired: 08/18/2019 Comorbid Anemia, Sleep Apnea, Arrhythmia, Congestive Heart Failure, Weeks Of Treatment: 37 History: Coronary Artery Disease, Hypertension, Myocardial Infarction, Clustered Wound: Yes Type II Diabetes, Gout, Neuropathy Wound Measurements Length: (cm) 4.2 Width: (cm) 3.5 Depth: (cm) 0.1 Clustered Quantity: 5 Area: (cm) 11.545 Volume: (cm) 1.155 % Reduction in Area: -1999.1% % Reduction in Volume: -2000% Epithelialization: Small (1-33%) Tunneling: No Undermining: No Wound Description Classification: Grade 2 Wound Margin: Flat and Intact Exudate Amount: Medium Exudate Type: Serosanguineous Exudate Color: red, brown Foul Odor After Cleansing: No Slough/Fibrino Yes Wound Bed Granulation Amount: Small (1-33%) Exposed Structure Granulation Quality: Red Fascia Exposed: No Necrotic Amount: Medium (34-66%) Fat Layer (Subcutaneous Tissue) Exposed: Yes Necrotic Quality: Eschar, Adherent Slough Tendon Exposed: No Muscle Exposed: No Joint Exposed: No Bone Exposed: No Treatment Notes Wound #26 (Foot) Wound Laterality: Dorsal, Left Cleanser Wound Cleanser Discharge Instruction: Cleanse the wound with wound cleanser prior to applying a clean dressing using gauze sponges, not tissue or cotton balls. Peri-Wound Care Sween Lotion (Moisturizing lotion)  No Wound Description Classification: Grade 1 Wound Margin: Flat and Intact Exudate Amount: Small Exudate Type: Purulent Exudate Color: yellow, brown, green Foul Odor After Cleansing: No Slough/Fibrino No Wound Bed Granulation Amount: Large (67-100%) Exposed Structure Granulation Quality: Pink Fascia Exposed: No Necrotic Amount: Small (1-33%) Fat Layer (Subcutaneous Tissue) Exposed: Yes Necrotic Quality: Adherent Slough Tendon Exposed: No Muscle Exposed: No Joint Exposed: No Bone Exposed: No Treatment Notes Wound #29 (Calcaneus) Wound Laterality: Right Cleanser Soap and Water Discharge Instruction: May shower and wash wound with dial antibacterial soap and water prior to dressing change. Peri-Wound Care Sween Lotion (Moisturizing lotion) Discharge Instruction: Apply moisturizing lotion as directed Topical Primary Dressing KerraCel Ag Gelling Fiber Dressing, 4x5 in (silver alginate) Discharge Instruction: Apply silver alginate to wound bed as instructed Secondary Dressing Woven Gauze Sponge, Non-Sterile 4x4 in Discharge Instruction: Apply over primary dressing as directed. ABD Pad, 5x9 Discharge Instruction: Apply over primary dressing as directed. Secured With [employer] 4x5 (in/yd) Discharge Instruction: Secure with Coban as directed. Kerlix Roll Sterile, 4.5x3.1 (in/yd) Discharge  Instruction: Secure with Kerlix as directed. Compression Wrap Compression Stockings Add-Ons Electronic Signature(s) Signed: 05/03/2020 6:04:09 PM By: Baruch Gouty RN, BSN Entered By: Baruch Gouty on 05/03/2020 09:02:09 -------------------------------------------------------------------------------- Wound Assessment Details Patient Name: Date of Service: [name] Foley F. 05/03/2020 8:30 A M Medical Record Number: [id number] Patient Account Number: [id number] Date of Birth/Sex: Treating RN: 31-Jan-1946 (75 y.o. [name] Primary Care Torunn Chancellor: Other Clinician: [name], [name] Referring Abena Erdman: Treating Rolan Wrightsman/Extender: [name], [name] [name], [name] Weeks in Treatment: 211 Wound Status Wound Number: 30 Primary Diabetic Wound/Ulcer of the Lower Extremity Etiology: Wound Location: Left, Anterior Lower Leg Wound Open Wounding Event: Gradually Appeared Status: Date Acquired: 04/26/2020 Comorbid Anemia, Sleep Apnea, Arrhythmia, Congestive Heart Failure, Weeks Of Treatment: 1 History: Coronary Artery Disease, Hypertension, Myocardial Infarction, Clustered Wound: No Type II Diabetes, Gout, Neuropathy Wound Measurements Length: (cm) 0.9 Width: (cm) 0.6 Depth: (cm) 0.1 Area: (cm) 0.424 Volume: (cm) 0.042 % Reduction in Area: 74.3% % Reduction in Volume: 74.5% Epithelialization: Small (1-33%) Tunneling: No Undermining: No Wound Description Classification: Grade 1 Wound Margin: Flat and Intact Exudate Amount: Small Exudate Type: Purulent Exudate Color: yellow, brown, green Foul Odor After Cleansing: No Slough/Fibrino No Wound Bed Granulation Amount: Large (67-100%) Exposed Structure Granulation Quality: Pink Fascia Exposed: No Necrotic Amount: None Present (0%) Fat Layer (Subcutaneous Tissue) Exposed: Yes Tendon Exposed: No Muscle Exposed: No Joint Exposed: No Bone Exposed: No Treatment Notes Wound #30 (Lower Leg) Wound Laterality: Left,  Anterior Cleanser Soap and Water Discharge Instruction: May shower and wash wound with dial antibacterial soap and water prior to dressing change. Peri-Wound Care Sween Lotion (Moisturizing lotion) Discharge Instruction: Apply moisturizing lotion as directed Topical Primary Dressing KerraCel Ag Gelling Fiber Dressing, 4x5 in (silver alginate) Discharge Instruction: Apply silver alginate to wound bed as instructed Secondary Dressing Woven Gauze Sponge, Non-Sterile 4x4 in Discharge Instruction: Apply over primary dressing as directed. ABD Pad, 5x9 Discharge Instruction: Apply over primary dressing as directed. Secured With [employer] 4x5 (in/yd) Discharge Instruction: Secure with Coban as directed. Kerlix Roll Sterile, 4.5x3.1 (in/yd) Discharge Instruction: Secure with Kerlix as directed. Compression Wrap Compression Stockings Add-Ons Electronic Signature(s) Signed: 05/03/2020 6:04:09 PM By: Baruch Gouty RN, BSN Entered By: Baruch Gouty on 05/03/2020 09:02:36 -------------------------------------------------------------------------------- Wound Assessment Details Patient Name: Date of Service: [name] Foley F. 05/03/2020 8:30 A M Medical Record Number: [id number] Patient Account Number: [id number] Date of Birth/Sex: Treating RN: 04-Dec-1945 (75 y.o. [name] [name],  [name] [name], [name] [name] ([id number]) Visit Report for 05/03/2020 Arrival Information Details Patient Name: Date of Service: [name] Grapes [facility] F. 05/03/2020 8:30 A M Medical Record Number: [id number] Patient Account Number: [id number] Date of Birth/Sex: Treating RN: [birth year] (75 y.o. [name] [name], Vaughan Basta Primary Care Maegan Buller: [name], [name] Other Clinician: Referring Konstantin Lehnen: Treating Maxim Bedel/Extender: [name], [name] [name], [name] Weeks in Treatment: 211 Visit Information History Since Last Visit Added or deleted any medications: No Patient Arrived: Wheel Chair Any new allergies or adverse reactions: No Arrival Time: 08:32 Had a fall or experienced change in No Accompanied By: dgt activities of daily living that may affect Transfer Assistance: None risk of falls: Patient Identification Verified: Yes Signs or symptoms of abuse/neglect since last visito No Secondary Verification Process Completed: Yes Hospitalized since last visit: No Patient Requires Transmission-Based Precautions: No Implantable device outside of the clinic excluding No Patient Has Alerts: Yes cellular tissue based products placed in the center Patient Alerts: Patient on Blood Thinner since last visit: left ABI 1.0; rt ABI 1.09 Has Dressing in Place as Prescribed: Yes Has Compression in Place as Prescribed: Yes Pain Present Now: No Electronic Signature(s) Signed: 05/03/2020 6:04:09 PM By: Baruch Gouty RN, BSN Entered By: Baruch Gouty on 05/03/2020 08:38:39 -------------------------------------------------------------------------------- Encounter Discharge Information Details Patient Name: Date of Service: [name] Foley F. 05/03/2020 8:30 A M Medical Record Number: [id number] Patient Account Number: [id number] Date of Birth/Sex: Treating RN: May 03, 1945 (75 y.o. [name] Primary Care Hyman Crossan: [name], [name] Other Clinician: Referring Adahlia Stembridge: Treating Drayson Dorko/Extender: [name], [name] [name],  [name] Weeks in Treatment: 211 Encounter Discharge Information Items Post Procedure Vitals Discharge Condition: Stable Temperature (F): 98.2 Ambulatory Status: Wheelchair Pulse (bpm): 76 Discharge Destination: Home Respiratory Rate (breaths/min): 18 Transportation: Private Auto Blood Pressure (mmHg): 125/67 Accompanied By: self Schedule Follow-up Appointment: Yes Clinical Summary of Care: Electronic Signature(s) Signed: 05/03/2020 2:51:26 PM By: Deon Pilling Entered By: Deon Pilling on 05/03/2020 09:56:23 -------------------------------------------------------------------------------- Lower Extremity Assessment Details Patient Name: Date of Service: [name] Grapes [facility] F. 05/03/2020 8:30 A M Medical Record Number: [id number] Patient Account Number: [id number] Date of Birth/Sex: Treating RN: March 11, 1946 (75 y.o. [name] Primary Care Jewelz Ricklefs: [name], [name] Other Clinician: Referring Cassandria Drew: Treating Kewanna Kasprzak/Extender: [name], [name] [name], [name] Weeks in Treatment: 211 Edema Assessment Assessed: Shirlyn Goltz: No] [Right: No] Edema: [Left: No] [Right: No] Calf Left: Right: Point of Measurement: 36 cm From Medial Instep 23.8 cm 25 cm Ankle Left: Right: Point of Measurement: 11 cm From Medial Instep 18 cm 18.2 cm Vascular Assessment Pulses: Dorsalis Pedis Palpable: [Left:No] [Right:No] Electronic Signature(s) Signed: 05/03/2020 6:04:09 PM By: Baruch Gouty RN, BSN Entered By: Baruch Gouty on 05/03/2020 09:05:24 -------------------------------------------------------------------------------- Multi Wound Chart Details Patient Name: Date of Service: [name] Foley F. 05/03/2020 8:30 A M Medical Record Number: [id number] Patient Account Number: [id number] Date of Birth/Sex: Treating RN: [birth year] (75 y.o. [name], [name] Primary Care Hilja Kintzel: [name], [name] Other Clinician: Referring Amarius Toto: Treating Cosette Prindle/Extender: [name], [name] [name],  [name] Weeks in Treatment: 211 Vital Signs Height(in): 74 Capillary Blood Glucose(mg/dl): 123 Weight(lbs): 150 Pulse(bpm): 85 Body Mass Index(BMI): 19 Blood Pressure(mmHg): 125/67 Temperature(F): 98.2 Respiratory Rate(breaths/min): 18 Photos: [14:No Photos Left, Lateral Lower Leg] [25:No Photos Right, Lateral Lower Leg] [26:No Photos Left, Dorsal Foot] Wound Location: [14:Gradually Appeared] [25:Gradually Appeared] [26:Gradually Appeared] Wounding Event: [14:Diabetic Wound/Ulcer of the Lower] [25:Venous Leg Ulcer] [26:Diabetic Wound/Ulcer of the Lower] Primary Etiology: [14:Extremity Anemia, Sleep Apnea, Arrhythmia,] [25:Anemia, Sleep Apnea, Arrhythmia,] [26:Extremity Anemia, Sleep Apnea, Arrhythmia,] Comorbid History: [14:Congestive Heart Failure, Coronary  [name] [name], [name] [name] ([id number]) Visit Report for 05/03/2020 Arrival Information Details Patient Name: Date of Service: [name] Grapes [facility] F. 05/03/2020 8:30 A M Medical Record Number: [id number] Patient Account Number: [id number] Date of Birth/Sex: Treating RN: [birth year] (75 y.o. [name] [name], Vaughan Basta Primary Care Maegan Buller: [name], [name] Other Clinician: Referring Konstantin Lehnen: Treating Maxim Bedel/Extender: [name], [name] [name], [name] Weeks in Treatment: 211 Visit Information History Since Last Visit Added or deleted any medications: No Patient Arrived: Wheel Chair Any new allergies or adverse reactions: No Arrival Time: 08:32 Had a fall or experienced change in No Accompanied By: dgt activities of daily living that may affect Transfer Assistance: None risk of falls: Patient Identification Verified: Yes Signs or symptoms of abuse/neglect since last visito No Secondary Verification Process Completed: Yes Hospitalized since last visit: No Patient Requires Transmission-Based Precautions: No Implantable device outside of the clinic excluding No Patient Has Alerts: Yes cellular tissue based products placed in the center Patient Alerts: Patient on Blood Thinner since last visit: left ABI 1.0; rt ABI 1.09 Has Dressing in Place as Prescribed: Yes Has Compression in Place as Prescribed: Yes Pain Present Now: No Electronic Signature(s) Signed: 05/03/2020 6:04:09 PM By: Baruch Gouty RN, BSN Entered By: Baruch Gouty on 05/03/2020 08:38:39 -------------------------------------------------------------------------------- Encounter Discharge Information Details Patient Name: Date of Service: [name] Foley F. 05/03/2020 8:30 A M Medical Record Number: [id number] Patient Account Number: [id number] Date of Birth/Sex: Treating RN: May 03, 1945 (75 y.o. [name] Primary Care Hyman Crossan: [name], [name] Other Clinician: Referring Adahlia Stembridge: Treating Drayson Dorko/Extender: [name], [name] [name],  [name] Weeks in Treatment: 211 Encounter Discharge Information Items Post Procedure Vitals Discharge Condition: Stable Temperature (F): 98.2 Ambulatory Status: Wheelchair Pulse (bpm): 76 Discharge Destination: Home Respiratory Rate (breaths/min): 18 Transportation: Private Auto Blood Pressure (mmHg): 125/67 Accompanied By: self Schedule Follow-up Appointment: Yes Clinical Summary of Care: Electronic Signature(s) Signed: 05/03/2020 2:51:26 PM By: Deon Pilling Entered By: Deon Pilling on 05/03/2020 09:56:23 -------------------------------------------------------------------------------- Lower Extremity Assessment Details Patient Name: Date of Service: [name] Grapes [facility] F. 05/03/2020 8:30 A M Medical Record Number: [id number] Patient Account Number: [id number] Date of Birth/Sex: Treating RN: March 11, 1946 (75 y.o. [name] Primary Care Jewelz Ricklefs: [name], [name] Other Clinician: Referring Cassandria Drew: Treating Kewanna Kasprzak/Extender: [name], [name] [name], [name] Weeks in Treatment: 211 Edema Assessment Assessed: Shirlyn Goltz: No] [Right: No] Edema: [Left: No] [Right: No] Calf Left: Right: Point of Measurement: 36 cm From Medial Instep 23.8 cm 25 cm Ankle Left: Right: Point of Measurement: 11 cm From Medial Instep 18 cm 18.2 cm Vascular Assessment Pulses: Dorsalis Pedis Palpable: [Left:No] [Right:No] Electronic Signature(s) Signed: 05/03/2020 6:04:09 PM By: Baruch Gouty RN, BSN Entered By: Baruch Gouty on 05/03/2020 09:05:24 -------------------------------------------------------------------------------- Multi Wound Chart Details Patient Name: Date of Service: [name] Foley F. 05/03/2020 8:30 A M Medical Record Number: [id number] Patient Account Number: [id number] Date of Birth/Sex: Treating RN: [birth year] (75 y.o. [name], [name] Primary Care Hilja Kintzel: [name], [name] Other Clinician: Referring Amarius Toto: Treating Cosette Prindle/Extender: [name], [name] [name],  [name] Weeks in Treatment: 211 Vital Signs Height(in): 74 Capillary Blood Glucose(mg/dl): 123 Weight(lbs): 150 Pulse(bpm): 85 Body Mass Index(BMI): 19 Blood Pressure(mmHg): 125/67 Temperature(F): 98.2 Respiratory Rate(breaths/min): 18 Photos: [14:No Photos Left, Lateral Lower Leg] [25:No Photos Right, Lateral Lower Leg] [26:No Photos Left, Dorsal Foot] Wound Location: [14:Gradually Appeared] [25:Gradually Appeared] [26:Gradually Appeared] Wounding Event: [14:Diabetic Wound/Ulcer of the Lower] [25:Venous Leg Ulcer] [26:Diabetic Wound/Ulcer of the Lower] Primary Etiology: [14:Extremity Anemia, Sleep Apnea, Arrhythmia,] [25:Anemia, Sleep Apnea, Arrhythmia,] [26:Extremity Anemia, Sleep Apnea, Arrhythmia,] Comorbid History: [14:Congestive Heart Failure, Coronary  [name] [name], [name] [name] ([id number]) Visit Report for 05/03/2020 Arrival Information Details Patient Name: Date of Service: [name] Grapes [facility] F. 05/03/2020 8:30 A M Medical Record Number: [id number] Patient Account Number: [id number] Date of Birth/Sex: Treating RN: [birth year] (75 y.o. [name] [name], Vaughan Basta Primary Care Maegan Buller: [name], [name] Other Clinician: Referring Konstantin Lehnen: Treating Maxim Bedel/Extender: [name], [name] [name], [name] Weeks in Treatment: 211 Visit Information History Since Last Visit Added or deleted any medications: No Patient Arrived: Wheel Chair Any new allergies or adverse reactions: No Arrival Time: 08:32 Had a fall or experienced change in No Accompanied By: dgt activities of daily living that may affect Transfer Assistance: None risk of falls: Patient Identification Verified: Yes Signs or symptoms of abuse/neglect since last visito No Secondary Verification Process Completed: Yes Hospitalized since last visit: No Patient Requires Transmission-Based Precautions: No Implantable device outside of the clinic excluding No Patient Has Alerts: Yes cellular tissue based products placed in the center Patient Alerts: Patient on Blood Thinner since last visit: left ABI 1.0; rt ABI 1.09 Has Dressing in Place as Prescribed: Yes Has Compression in Place as Prescribed: Yes Pain Present Now: No Electronic Signature(s) Signed: 05/03/2020 6:04:09 PM By: Baruch Gouty RN, BSN Entered By: Baruch Gouty on 05/03/2020 08:38:39 -------------------------------------------------------------------------------- Encounter Discharge Information Details Patient Name: Date of Service: [name] Foley F. 05/03/2020 8:30 A M Medical Record Number: [id number] Patient Account Number: [id number] Date of Birth/Sex: Treating RN: May 03, 1945 (75 y.o. [name] Primary Care Hyman Crossan: [name], [name] Other Clinician: Referring Adahlia Stembridge: Treating Drayson Dorko/Extender: [name], [name] [name],  [name] Weeks in Treatment: 211 Encounter Discharge Information Items Post Procedure Vitals Discharge Condition: Stable Temperature (F): 98.2 Ambulatory Status: Wheelchair Pulse (bpm): 76 Discharge Destination: Home Respiratory Rate (breaths/min): 18 Transportation: Private Auto Blood Pressure (mmHg): 125/67 Accompanied By: self Schedule Follow-up Appointment: Yes Clinical Summary of Care: Electronic Signature(s) Signed: 05/03/2020 2:51:26 PM By: Deon Pilling Entered By: Deon Pilling on 05/03/2020 09:56:23 -------------------------------------------------------------------------------- Lower Extremity Assessment Details Patient Name: Date of Service: [name] Grapes [facility] F. 05/03/2020 8:30 A M Medical Record Number: [id number] Patient Account Number: [id number] Date of Birth/Sex: Treating RN: March 11, 1946 (75 y.o. [name] Primary Care Jewelz Ricklefs: [name], [name] Other Clinician: Referring Cassandria Drew: Treating Kewanna Kasprzak/Extender: [name], [name] [name], [name] Weeks in Treatment: 211 Edema Assessment Assessed: Shirlyn Goltz: No] [Right: No] Edema: [Left: No] [Right: No] Calf Left: Right: Point of Measurement: 36 cm From Medial Instep 23.8 cm 25 cm Ankle Left: Right: Point of Measurement: 11 cm From Medial Instep 18 cm 18.2 cm Vascular Assessment Pulses: Dorsalis Pedis Palpable: [Left:No] [Right:No] Electronic Signature(s) Signed: 05/03/2020 6:04:09 PM By: Baruch Gouty RN, BSN Entered By: Baruch Gouty on 05/03/2020 09:05:24 -------------------------------------------------------------------------------- Multi Wound Chart Details Patient Name: Date of Service: [name] Foley F. 05/03/2020 8:30 A M Medical Record Number: [id number] Patient Account Number: [id number] Date of Birth/Sex: Treating RN: [birth year] (75 y.o. [name], [name] Primary Care Hilja Kintzel: [name], [name] Other Clinician: Referring Amarius Toto: Treating Cosette Prindle/Extender: [name], [name] [name],  [name] Weeks in Treatment: 211 Vital Signs Height(in): 74 Capillary Blood Glucose(mg/dl): 123 Weight(lbs): 150 Pulse(bpm): 85 Body Mass Index(BMI): 19 Blood Pressure(mmHg): 125/67 Temperature(F): 98.2 Respiratory Rate(breaths/min): 18 Photos: [14:No Photos Left, Lateral Lower Leg] [25:No Photos Right, Lateral Lower Leg] [26:No Photos Left, Dorsal Foot] Wound Location: [14:Gradually Appeared] [25:Gradually Appeared] [26:Gradually Appeared] Wounding Event: [14:Diabetic Wound/Ulcer of the Lower] [25:Venous Leg Ulcer] [26:Diabetic Wound/Ulcer of the Lower] Primary Etiology: [14:Extremity Anemia, Sleep Apnea, Arrhythmia,] [25:Anemia, Sleep Apnea, Arrhythmia,] [26:Extremity Anemia, Sleep Apnea, Arrhythmia,] Comorbid History: [14:Congestive Heart Failure, Coronary

## 2020-05-03 NOTE — Progress Notes (Signed)
DEADRICK, STIDD ([id number]) Visit Report for 05/03/2020 Debridement Details Patient Name: Date of Service: [name] [name] [facility] F. 05/03/2020 8:30 A M Medical Record Number: [id number] Patient Account Number: [id number] Date of Birth/Sex: Treating RN: [birth year] (75 y.o. [name], [name] Primary Care Provider: [name], [name] Other Clinician: Referring Provider: Treating Provider/Extender: Scarlettrose Costilow [name], [name] Weeks in Treatment: 211 Debridement Performed for Assessment: Wound #32 Left,Distal,Dorsal Foot Performed By: Physician [name], MD Debridement Type: Debridement Severity of Tissue Pre Debridement: Fat layer exposed Level of Consciousness (Pre-procedure): Awake and Alert Pre-procedure Verification/Time Out Yes - 09:13 Taken: Start Time: 09:13 Pain Control: Lidocaine T Area Debrided (L x W): otal 1.3 (cm) x 1.7 (cm) = 2.21 (cm) Tissue and other material debrided: Viable, Non-Viable, Slough, Subcutaneous, Slough Level: Skin/Subcutaneous Tissue Debridement Description: Excisional Instrument: Curette Bleeding: Minimum Hemostasis Achieved: Pressure End Time: 09:14 Procedural Pain: 0 Post Procedural Pain: 0 Response to Treatment: Procedure was tolerated well Level of Consciousness (Post- Awake and Alert procedure): Post Debridement Measurements of Total Wound Length: (cm) 1.3 Width: (cm) 1.7 Depth: (cm) 0.1 Volume: (cm) 0.174 Character of Wound/Ulcer Post Debridement: Improved Severity of Tissue Post Debridement: Fat layer exposed Post Procedure Diagnosis Same as Pre-procedure Electronic Signature(s) Signed: 05/03/2020 5:07:05 PM By: Linton Ham MD Signed: 05/03/2020 5:35:07 PM By: [name] Entered By: [name] on 05/03/2020 09:14:33 -------------------------------------------------------------------------------- Debridement Details Patient Name: Date of Service: [name] [name] F. 05/03/2020 8:30 A M Medical Record  Number: [id number] Patient Account Number: [id number] Date of Birth/Sex: Treating RN: Jan 06, 1946 (75 y.o. [name], [name] Primary Care Provider: [name], [name] Other Clinician: Referring Provider: Treating Provider/Extender: Kaitland Lewellyn [name], [name] Weeks in Treatment: 211 Debridement Performed for Assessment: Wound #26 Left,Dorsal Foot Performed By: Physician [name], MD Debridement Type: Debridement Severity of Tissue Pre Debridement: Fat layer exposed Level of Consciousness (Pre-procedure): Awake and Alert Pre-procedure Verification/Time Out Yes - 09:13 Taken: Start Time: 09:13 Pain Control: Lidocaine T Area Debrided (L x W): otal 4.2 (cm) x 3.5 (cm) = 14.7 (cm) Tissue and other material debrided: Viable, Non-Viable, Slough, Subcutaneous, Slough Level: Skin/Subcutaneous Tissue Debridement Description: Excisional Instrument: Curette Bleeding: Minimum Hemostasis Achieved: Pressure End Time: 09:14 Procedural Pain: 0 Post Procedural Pain: 0 Response to Treatment: Procedure was tolerated well Level of Consciousness (Post- Awake and Alert procedure): Post Debridement Measurements of Total Wound Length: (cm) 4.2 Width: (cm) 3.5 Depth: (cm) 0.1 Volume: (cm) 1.155 Character of Wound/Ulcer Post Debridement: Improved Severity of Tissue Post Debridement: Fat layer exposed Post Procedure Diagnosis Same as Pre-procedure Electronic Signature(s) Signed: 05/03/2020 5:07:05 PM By: Linton Ham MD Signed: 05/03/2020 5:35:07 PM By: [name] Entered By: Linton Ham on 05/03/2020 09:19:41 -------------------------------------------------------------------------------- HPI Details Patient Name: Date of Service: [name] [name] F. 05/03/2020 8:30 A M Medical Record Number: [id number] Patient Account Number: [id number] Date of Birth/Sex: Treating RN: [birth year] (75 y.o. [name] Primary Care Provider: [name], [name] Other  Clinician: Referring Provider: Treating Provider/Extender: Morayo Leven [name], [name] Weeks in Treatment: 211 History of Present Illness Location: On the left and right lateral forefoot which has been there for about 6 months Quality: Patient reports No Pain. Severity: Patient states wound(s) are getting worse. Duration: Patient has had the wound for > 6 months prior to seeking treatment at the wound center Context: The wound would happen gradually Modifying Factors: Patient wound(s)/ulcer(s) are worsening due to :continual drainage from the wound ssociated Signs and Symptoms: Patient reports having increase discharge. A HPI Description: This  DEADRICK, STIDD ([id number]) Visit Report for 05/03/2020 Debridement Details Patient Name: Date of Service: [name] [name] [facility] F. 05/03/2020 8:30 A M Medical Record Number: [id number] Patient Account Number: [id number] Date of Birth/Sex: Treating RN: [birth year] (75 y.o. [name], [name] Primary Care Provider: [name], [name] Other Clinician: Referring Provider: Treating Provider/Extender: Scarlettrose Costilow [name], [name] Weeks in Treatment: 211 Debridement Performed for Assessment: Wound #32 Left,Distal,Dorsal Foot Performed By: Physician [name], MD Debridement Type: Debridement Severity of Tissue Pre Debridement: Fat layer exposed Level of Consciousness (Pre-procedure): Awake and Alert Pre-procedure Verification/Time Out Yes - 09:13 Taken: Start Time: 09:13 Pain Control: Lidocaine T Area Debrided (L x W): otal 1.3 (cm) x 1.7 (cm) = 2.21 (cm) Tissue and other material debrided: Viable, Non-Viable, Slough, Subcutaneous, Slough Level: Skin/Subcutaneous Tissue Debridement Description: Excisional Instrument: Curette Bleeding: Minimum Hemostasis Achieved: Pressure End Time: 09:14 Procedural Pain: 0 Post Procedural Pain: 0 Response to Treatment: Procedure was tolerated well Level of Consciousness (Post- Awake and Alert procedure): Post Debridement Measurements of Total Wound Length: (cm) 1.3 Width: (cm) 1.7 Depth: (cm) 0.1 Volume: (cm) 0.174 Character of Wound/Ulcer Post Debridement: Improved Severity of Tissue Post Debridement: Fat layer exposed Post Procedure Diagnosis Same as Pre-procedure Electronic Signature(s) Signed: 05/03/2020 5:07:05 PM By: Linton Ham MD Signed: 05/03/2020 5:35:07 PM By: [name] Entered By: [name] on 05/03/2020 09:14:33 -------------------------------------------------------------------------------- Debridement Details Patient Name: Date of Service: [name] [name] F. 05/03/2020 8:30 A M Medical Record  Number: [id number] Patient Account Number: [id number] Date of Birth/Sex: Treating RN: Jan 06, 1946 (75 y.o. [name], [name] Primary Care Provider: [name], [name] Other Clinician: Referring Provider: Treating Provider/Extender: Kaitland Lewellyn [name], [name] Weeks in Treatment: 211 Debridement Performed for Assessment: Wound #26 Left,Dorsal Foot Performed By: Physician [name], MD Debridement Type: Debridement Severity of Tissue Pre Debridement: Fat layer exposed Level of Consciousness (Pre-procedure): Awake and Alert Pre-procedure Verification/Time Out Yes - 09:13 Taken: Start Time: 09:13 Pain Control: Lidocaine T Area Debrided (L x W): otal 4.2 (cm) x 3.5 (cm) = 14.7 (cm) Tissue and other material debrided: Viable, Non-Viable, Slough, Subcutaneous, Slough Level: Skin/Subcutaneous Tissue Debridement Description: Excisional Instrument: Curette Bleeding: Minimum Hemostasis Achieved: Pressure End Time: 09:14 Procedural Pain: 0 Post Procedural Pain: 0 Response to Treatment: Procedure was tolerated well Level of Consciousness (Post- Awake and Alert procedure): Post Debridement Measurements of Total Wound Length: (cm) 4.2 Width: (cm) 3.5 Depth: (cm) 0.1 Volume: (cm) 1.155 Character of Wound/Ulcer Post Debridement: Improved Severity of Tissue Post Debridement: Fat layer exposed Post Procedure Diagnosis Same as Pre-procedure Electronic Signature(s) Signed: 05/03/2020 5:07:05 PM By: Linton Ham MD Signed: 05/03/2020 5:35:07 PM By: [name] Entered By: Linton Ham on 05/03/2020 09:19:41 -------------------------------------------------------------------------------- HPI Details Patient Name: Date of Service: [name] [name] F. 05/03/2020 8:30 A M Medical Record Number: [id number] Patient Account Number: [id number] Date of Birth/Sex: Treating RN: [birth year] (75 y.o. [name] Primary Care Provider: [name], [name] Other  Clinician: Referring Provider: Treating Provider/Extender: Morayo Leven [name], [name] Weeks in Treatment: 211 History of Present Illness Location: On the left and right lateral forefoot which has been there for about 6 months Quality: Patient reports No Pain. Severity: Patient states wound(s) are getting worse. Duration: Patient has had the wound for > 6 months prior to seeking treatment at the wound center Context: The wound would happen gradually Modifying Factors: Patient wound(s)/ulcer(s) are worsening due to :continual drainage from the wound ssociated Signs and Symptoms: Patient reports having increase discharge. A HPI Description: This  healed. There is callus here however no open wounds. The substantial area on his left upper lateral tibia which has been so refractory looks a lot better. He still has multiple small open areas on the dorsal left foot and ankle. One of these at the crease of the foot and ankle is actually quite deep and has debris in the center. On the right leg he only has one small open area on the right lateral leg this is just about closed We apparently had the order of silver alginate they come in with polymen Ag. However given the fact that things look so much better I am going to continue with the latter 04/26/2020; patient returns to the clinic after about a 9 to 10-week hiatus. I am not exactly sure why. The patient says he had a cold and was sick. He was apparently tested for Covid but was  negative. He literally dropped off our schedule. He arrives back in clinic with a multitude of wounds especially on the left dorsal foot and lower leg. There are also areas on the right dorsal foot and the right lower leg. Fortunately the pressure ulcers on the bilateral lateral heels of once again closed. Again the history here is vague. Apparently everything is deteriorated on the left greater than right over the last 2 to 3 weeks. His daughter wondered whether they were not wrapping this properly they have been applying topical antibiotic and a kerlix Coban wrap. He absolutely denies any pain 1/11; I put him under our kerlix, Coban wrap and the edema control is a lot better. They have not been changing this at home. There has been some improvement in his wounds except on the left dorsal foot and ankle. In fact he has a new wound on the left dorsal foot. The last time he was here I reviewed his past arterial studies and I am going to repeat them. Through this timeframe the wounds on his bilateral heels, pressure ulcers have actually managed to heal. Electronic Signature(s) Signed: 05/03/2020 5:07:05 PM By: Linton Ham MD Entered By: Linton Ham on 05/03/2020 09:22:26 -------------------------------------------------------------------------------- Physical Exam Details Patient Name: Date of Service: [name] [name] F. 05/03/2020 8:30 A M Medical Record Number: [id number] Patient Account Number: [id number] Date of Birth/Sex: Treating RN: 05-Apr-1946 (75 y.o. [name] Primary Care Provider: [name], [name] Other Clinician: Referring Provider: Treating Provider/Extender: Rodnesha Elie [name], [name] Weeks in Treatment: 211 Constitutional Sitting or standing Blood Pressure is within target range for patient.. Pulse regular and within target range for patient.Marland Kitchen Respirations regular, non-labored and within target range.. Temperature is normal and within the target range for the  patient.Marland Kitchen Appears in no distress. Notes Wound exam; the areas on the dorsal foot continue to deteriorate. He has a new one distally the proximal 1 is necrotic removal of this reveals exposed tendon. I debrided both of these areas with a #5 curette hemostasis with direct pressure. The area on the left lateral tibia actually looks a lot better The wounds on the right looks smaller lesions are mostly on the right ankle and dorsal foot On both sides we have much better edema control Electronic Signature(s) Signed: 05/03/2020 5:07:05 PM By: Linton Ham MD Entered By: Linton Ham on 05/03/2020 09:25:53 -------------------------------------------------------------------------------- Physician Orders Details Patient Name: Date of Service: [name] [name] F. 05/03/2020 8:30 A M Medical Record Number: [id number] Patient Account Number: [id number] Date of Birth/Sex: Treating RN: 1946-02-19 (75 y.o. [name], [name] Primary Care Provider: [name], [name] Other  May shower and wash wound with dial antibacterial soap and water prior to dressing change. Peri-Wound Care: Sween Lotion (Moisturizing lotion) 1 x Per Week/15 Days Discharge Instructions: Apply moisturizing lotion as directed Prim Dressing: KerraCel Ag Gelling Fiber Dressing, 4x5 in (silver alginate) 1 x Per Week/15 Days ary Discharge Instructions: Apply silver alginate to wound bed as instructed Secondary Dressing: Woven Gauze Sponge, Non-Sterile 4x4 in 1 x Per Week/15 Days Discharge Instructions: Apply over primary dressing as directed. Secondary Dressing: ABD Pad, 5x9 1 x Per Week/15 Days Discharge Instructions: Apply over primary dressing as  directed. Secured With: Coban Self-Adherent Wrap 4x5 (in/yd) 1 x Per Week/15 Days Discharge Instructions: Secure with Coban as directed. Secured With: [employer], 4.5x3.1 (in/yd) 1 x Per Week/15 Days Discharge Instructions: Secure with Kerlix as directed. Services and Therapies nkle Brachial Index (ABI) - with TBI's-bilateral A Electronic Signature(s) Signed: 05/03/2020 5:07:05 PM By: Linton Ham MD Signed: 05/03/2020 5:35:07 PM By: [name] Entered By: [name] on 05/03/2020 14:50:26 -------------------------------------------------------------------------------- Problem List Details Patient Name: Date of Service: [name] [name] F. 05/03/2020 8:30 A M Medical Record Number: [id number] Patient Account Number: [id number] Date of Birth/Sex: Treating RN: 30-Jan-1946 (75 y.o. [name], [name] Primary Care Provider: [name], [name] Other Clinician: Referring Provider: Treating Provider/Extender: Joeanne Robicheaux [name], [name] Weeks in Treatment: 211 Active Problems ICD-10 Encounter Code Description Active Date MDM Diagnosis E11.621 Type 2 diabetes mellitus with foot ulcer 04/17/2016 No Yes L97.221 Non-pressure chronic ulcer of left calf limited to breakdown of skin 08/15/2017 No Yes L97.321 Non-pressure chronic ulcer of left ankle limited to breakdown of skin 07/01/2018 No Yes L97.521 Non-pressure chronic ulcer of other part of left foot limited to breakdown of 02/17/2019 No Yes skin L97.211 Non-pressure chronic ulcer of right calf limited to breakdown of skin 07/21/2019 No Yes Inactive Problems ICD-10 Code Description Active Date Inactive Date L89.613 Pressure ulcer of right heel, stage 3 04/17/2016 04/17/2016 L03.032 Cellulitis of left toe 01/06/2019 01/06/2019 K02.542 Pressure ulcer of left heel, stage 3 12/04/2016 12/04/2016 I25.119 Atherosclerotic heart disease of native coronary artery with unspecified angina 04/17/2016  04/17/2016 pectoris L97.821 Non-pressure chronic ulcer of other part of left lower leg limited to breakdown of skin 01/06/2019 01/06/2019 S51.811D Laceration without foreign body of right forearm, subsequent encounter 10/22/2017 10/22/2017 H06.23 Acute diastolic (congestive) heart failure 04/17/2016 04/17/2016 L03.116 Cellulitis of left lower limb 12/24/2017 12/24/2017 L89.620 Pressure ulcer of left heel, unstageable 07/21/2019 07/21/2019 S80.211D Abrasion, right knee, subsequent encounter 08/18/2019 08/18/2019 Resolved Problems ICD-10 Code Description Active Date Resolved Date L89.512 Pressure ulcer of right ankle, stage 2 04/17/2016 04/17/2016 L89.522 Pressure ulcer of left ankle, stage 2 04/17/2016 04/17/2016 Electronic Signature(s) Signed: 05/03/2020 5:07:05 PM By: Linton Ham MD Entered By: Linton Ham on 05/03/2020 09:16:17 -------------------------------------------------------------------------------- Progress Note Details Patient Name: Date of Service: [name] [name] F. 05/03/2020 8:30 A M Medical Record Number: [id number] Patient Account Number: [id number] Date of Birth/Sex: Treating RN: May 09, 1945 (75 y.o. [name], [name] Primary Care Provider: [name], [name] Other Clinician: Referring Provider: Treating Provider/Extender: Loxley Cibrian [name], [name] Weeks in Treatment: 211 Subjective History of Present Illness (HPI) The following HPI elements were documented for the patient's wound: Location: On the left and right lateral forefoot which has been there for about 6 months Quality: Patient reports No Pain. Severity: Patient states wound(s) are getting worse. Duration: Patient has had the wound for > 6 months prior to seeking treatment at the wound center Context: The wound would happen gradually Modifying Factors:  healed. There is callus here however no open wounds. The substantial area on his left upper lateral tibia which has been so refractory looks a lot better. He still has multiple small open areas on the dorsal left foot and ankle. One of these at the crease of the foot and ankle is actually quite deep and has debris in the center. On the right leg he only has one small open area on the right lateral leg this is just about closed We apparently had the order of silver alginate they come in with polymen Ag. However given the fact that things look so much better I am going to continue with the latter 04/26/2020; patient returns to the clinic after about a 9 to 10-week hiatus. I am not exactly sure why. The patient says he had a cold and was sick. He was apparently tested for Covid but was  negative. He literally dropped off our schedule. He arrives back in clinic with a multitude of wounds especially on the left dorsal foot and lower leg. There are also areas on the right dorsal foot and the right lower leg. Fortunately the pressure ulcers on the bilateral lateral heels of once again closed. Again the history here is vague. Apparently everything is deteriorated on the left greater than right over the last 2 to 3 weeks. His daughter wondered whether they were not wrapping this properly they have been applying topical antibiotic and a kerlix Coban wrap. He absolutely denies any pain 1/11; I put him under our kerlix, Coban wrap and the edema control is a lot better. They have not been changing this at home. There has been some improvement in his wounds except on the left dorsal foot and ankle. In fact he has a new wound on the left dorsal foot. The last time he was here I reviewed his past arterial studies and I am going to repeat them. Through this timeframe the wounds on his bilateral heels, pressure ulcers have actually managed to heal. Electronic Signature(s) Signed: 05/03/2020 5:07:05 PM By: Linton Ham MD Entered By: Linton Ham on 05/03/2020 09:22:26 -------------------------------------------------------------------------------- Physical Exam Details Patient Name: Date of Service: [name] [name] F. 05/03/2020 8:30 A M Medical Record Number: [id number] Patient Account Number: [id number] Date of Birth/Sex: Treating RN: 05-Apr-1946 (75 y.o. [name] Primary Care Provider: [name], [name] Other Clinician: Referring Provider: Treating Provider/Extender: Rodnesha Elie [name], [name] Weeks in Treatment: 211 Constitutional Sitting or standing Blood Pressure is within target range for patient.. Pulse regular and within target range for patient.Marland Kitchen Respirations regular, non-labored and within target range.. Temperature is normal and within the target range for the  patient.Marland Kitchen Appears in no distress. Notes Wound exam; the areas on the dorsal foot continue to deteriorate. He has a new one distally the proximal 1 is necrotic removal of this reveals exposed tendon. I debrided both of these areas with a #5 curette hemostasis with direct pressure. The area on the left lateral tibia actually looks a lot better The wounds on the right looks smaller lesions are mostly on the right ankle and dorsal foot On both sides we have much better edema control Electronic Signature(s) Signed: 05/03/2020 5:07:05 PM By: Linton Ham MD Entered By: Linton Ham on 05/03/2020 09:25:53 -------------------------------------------------------------------------------- Physician Orders Details Patient Name: Date of Service: [name] [name] F. 05/03/2020 8:30 A M Medical Record Number: [id number] Patient Account Number: [id number] Date of Birth/Sex: Treating RN: 1946-02-19 (75 y.o. [name], [name] Primary Care Provider: [name], [name] Other  May shower and wash wound with dial antibacterial soap and water prior to dressing change. Peri-Wound Care: Sween Lotion (Moisturizing lotion) 1 x Per Week/15 Days Discharge Instructions: Apply moisturizing lotion as directed Prim Dressing: KerraCel Ag Gelling Fiber Dressing, 4x5 in (silver alginate) 1 x Per Week/15 Days ary Discharge Instructions: Apply silver alginate to wound bed as instructed Secondary Dressing: Woven Gauze Sponge, Non-Sterile 4x4 in 1 x Per Week/15 Days Discharge Instructions: Apply over primary dressing as directed. Secondary Dressing: ABD Pad, 5x9 1 x Per Week/15 Days Discharge Instructions: Apply over primary dressing as  directed. Secured With: Coban Self-Adherent Wrap 4x5 (in/yd) 1 x Per Week/15 Days Discharge Instructions: Secure with Coban as directed. Secured With: [employer], 4.5x3.1 (in/yd) 1 x Per Week/15 Days Discharge Instructions: Secure with Kerlix as directed. Services and Therapies nkle Brachial Index (ABI) - with TBI's-bilateral A Electronic Signature(s) Signed: 05/03/2020 5:07:05 PM By: Linton Ham MD Signed: 05/03/2020 5:35:07 PM By: [name] Entered By: [name] on 05/03/2020 14:50:26 -------------------------------------------------------------------------------- Problem List Details Patient Name: Date of Service: [name] [name] F. 05/03/2020 8:30 A M Medical Record Number: [id number] Patient Account Number: [id number] Date of Birth/Sex: Treating RN: 30-Jan-1946 (75 y.o. [name], [name] Primary Care Provider: [name], [name] Other Clinician: Referring Provider: Treating Provider/Extender: Joeanne Robicheaux [name], [name] Weeks in Treatment: 211 Active Problems ICD-10 Encounter Code Description Active Date MDM Diagnosis E11.621 Type 2 diabetes mellitus with foot ulcer 04/17/2016 No Yes L97.221 Non-pressure chronic ulcer of left calf limited to breakdown of skin 08/15/2017 No Yes L97.321 Non-pressure chronic ulcer of left ankle limited to breakdown of skin 07/01/2018 No Yes L97.521 Non-pressure chronic ulcer of other part of left foot limited to breakdown of 02/17/2019 No Yes skin L97.211 Non-pressure chronic ulcer of right calf limited to breakdown of skin 07/21/2019 No Yes Inactive Problems ICD-10 Code Description Active Date Inactive Date L89.613 Pressure ulcer of right heel, stage 3 04/17/2016 04/17/2016 L03.032 Cellulitis of left toe 01/06/2019 01/06/2019 K02.542 Pressure ulcer of left heel, stage 3 12/04/2016 12/04/2016 I25.119 Atherosclerotic heart disease of native coronary artery with unspecified angina 04/17/2016  04/17/2016 pectoris L97.821 Non-pressure chronic ulcer of other part of left lower leg limited to breakdown of skin 01/06/2019 01/06/2019 S51.811D Laceration without foreign body of right forearm, subsequent encounter 10/22/2017 10/22/2017 H06.23 Acute diastolic (congestive) heart failure 04/17/2016 04/17/2016 L03.116 Cellulitis of left lower limb 12/24/2017 12/24/2017 L89.620 Pressure ulcer of left heel, unstageable 07/21/2019 07/21/2019 S80.211D Abrasion, right knee, subsequent encounter 08/18/2019 08/18/2019 Resolved Problems ICD-10 Code Description Active Date Resolved Date L89.512 Pressure ulcer of right ankle, stage 2 04/17/2016 04/17/2016 L89.522 Pressure ulcer of left ankle, stage 2 04/17/2016 04/17/2016 Electronic Signature(s) Signed: 05/03/2020 5:07:05 PM By: Linton Ham MD Entered By: Linton Ham on 05/03/2020 09:16:17 -------------------------------------------------------------------------------- Progress Note Details Patient Name: Date of Service: [name] [name] F. 05/03/2020 8:30 A M Medical Record Number: [id number] Patient Account Number: [id number] Date of Birth/Sex: Treating RN: May 09, 1945 (75 y.o. [name], [name] Primary Care Provider: [name], [name] Other Clinician: Referring Provider: Treating Provider/Extender: Loxley Cibrian [name], [name] Weeks in Treatment: 211 Subjective History of Present Illness (HPI) The following HPI elements were documented for the patient's wound: Location: On the left and right lateral forefoot which has been there for about 6 months Quality: Patient reports No Pain. Severity: Patient states wound(s) are getting worse. Duration: Patient has had the wound for > 6 months prior to seeking treatment at the wound center Context: The wound would happen gradually Modifying Factors:  DEADRICK, STIDD ([id number]) Visit Report for 05/03/2020 Debridement Details Patient Name: Date of Service: [name] [name] [facility] F. 05/03/2020 8:30 A M Medical Record Number: [id number] Patient Account Number: [id number] Date of Birth/Sex: Treating RN: [birth year] (75 y.o. [name], [name] Primary Care Provider: [name], [name] Other Clinician: Referring Provider: Treating Provider/Extender: Scarlettrose Costilow [name], [name] Weeks in Treatment: 211 Debridement Performed for Assessment: Wound #32 Left,Distal,Dorsal Foot Performed By: Physician [name], MD Debridement Type: Debridement Severity of Tissue Pre Debridement: Fat layer exposed Level of Consciousness (Pre-procedure): Awake and Alert Pre-procedure Verification/Time Out Yes - 09:13 Taken: Start Time: 09:13 Pain Control: Lidocaine T Area Debrided (L x W): otal 1.3 (cm) x 1.7 (cm) = 2.21 (cm) Tissue and other material debrided: Viable, Non-Viable, Slough, Subcutaneous, Slough Level: Skin/Subcutaneous Tissue Debridement Description: Excisional Instrument: Curette Bleeding: Minimum Hemostasis Achieved: Pressure End Time: 09:14 Procedural Pain: 0 Post Procedural Pain: 0 Response to Treatment: Procedure was tolerated well Level of Consciousness (Post- Awake and Alert procedure): Post Debridement Measurements of Total Wound Length: (cm) 1.3 Width: (cm) 1.7 Depth: (cm) 0.1 Volume: (cm) 0.174 Character of Wound/Ulcer Post Debridement: Improved Severity of Tissue Post Debridement: Fat layer exposed Post Procedure Diagnosis Same as Pre-procedure Electronic Signature(s) Signed: 05/03/2020 5:07:05 PM By: Linton Ham MD Signed: 05/03/2020 5:35:07 PM By: [name] Entered By: [name] on 05/03/2020 09:14:33 -------------------------------------------------------------------------------- Debridement Details Patient Name: Date of Service: [name] [name] F. 05/03/2020 8:30 A M Medical Record  Number: [id number] Patient Account Number: [id number] Date of Birth/Sex: Treating RN: Jan 06, 1946 (75 y.o. [name], [name] Primary Care Provider: [name], [name] Other Clinician: Referring Provider: Treating Provider/Extender: Kaitland Lewellyn [name], [name] Weeks in Treatment: 211 Debridement Performed for Assessment: Wound #26 Left,Dorsal Foot Performed By: Physician [name], MD Debridement Type: Debridement Severity of Tissue Pre Debridement: Fat layer exposed Level of Consciousness (Pre-procedure): Awake and Alert Pre-procedure Verification/Time Out Yes - 09:13 Taken: Start Time: 09:13 Pain Control: Lidocaine T Area Debrided (L x W): otal 4.2 (cm) x 3.5 (cm) = 14.7 (cm) Tissue and other material debrided: Viable, Non-Viable, Slough, Subcutaneous, Slough Level: Skin/Subcutaneous Tissue Debridement Description: Excisional Instrument: Curette Bleeding: Minimum Hemostasis Achieved: Pressure End Time: 09:14 Procedural Pain: 0 Post Procedural Pain: 0 Response to Treatment: Procedure was tolerated well Level of Consciousness (Post- Awake and Alert procedure): Post Debridement Measurements of Total Wound Length: (cm) 4.2 Width: (cm) 3.5 Depth: (cm) 0.1 Volume: (cm) 1.155 Character of Wound/Ulcer Post Debridement: Improved Severity of Tissue Post Debridement: Fat layer exposed Post Procedure Diagnosis Same as Pre-procedure Electronic Signature(s) Signed: 05/03/2020 5:07:05 PM By: Linton Ham MD Signed: 05/03/2020 5:35:07 PM By: [name] Entered By: Linton Ham on 05/03/2020 09:19:41 -------------------------------------------------------------------------------- HPI Details Patient Name: Date of Service: [name] [name] F. 05/03/2020 8:30 A M Medical Record Number: [id number] Patient Account Number: [id number] Date of Birth/Sex: Treating RN: [birth year] (75 y.o. [name] Primary Care Provider: [name], [name] Other  Clinician: Referring Provider: Treating Provider/Extender: Morayo Leven [name], [name] Weeks in Treatment: 211 History of Present Illness Location: On the left and right lateral forefoot which has been there for about 6 months Quality: Patient reports No Pain. Severity: Patient states wound(s) are getting worse. Duration: Patient has had the wound for > 6 months prior to seeking treatment at the wound center Context: The wound would happen gradually Modifying Factors: Patient wound(s)/ulcer(s) are worsening due to :continual drainage from the wound ssociated Signs and Symptoms: Patient reports having increase discharge. A HPI Description: This  May shower and wash wound with dial antibacterial soap and water prior to dressing change. Peri-Wound Care: Sween Lotion (Moisturizing lotion) 1 x Per Week/15 Days Discharge Instructions: Apply moisturizing lotion as directed Prim Dressing: KerraCel Ag Gelling Fiber Dressing, 4x5 in (silver alginate) 1 x Per Week/15 Days ary Discharge Instructions: Apply silver alginate to wound bed as instructed Secondary Dressing: Woven Gauze Sponge, Non-Sterile 4x4 in 1 x Per Week/15 Days Discharge Instructions: Apply over primary dressing as directed. Secondary Dressing: ABD Pad, 5x9 1 x Per Week/15 Days Discharge Instructions: Apply over primary dressing as  directed. Secured With: Coban Self-Adherent Wrap 4x5 (in/yd) 1 x Per Week/15 Days Discharge Instructions: Secure with Coban as directed. Secured With: [employer], 4.5x3.1 (in/yd) 1 x Per Week/15 Days Discharge Instructions: Secure with Kerlix as directed. Services and Therapies nkle Brachial Index (ABI) - with TBI's-bilateral A Electronic Signature(s) Signed: 05/03/2020 5:07:05 PM By: Linton Ham MD Signed: 05/03/2020 5:35:07 PM By: [name] Entered By: [name] on 05/03/2020 14:50:26 -------------------------------------------------------------------------------- Problem List Details Patient Name: Date of Service: [name] [name] F. 05/03/2020 8:30 A M Medical Record Number: [id number] Patient Account Number: [id number] Date of Birth/Sex: Treating RN: 30-Jan-1946 (75 y.o. [name], [name] Primary Care Provider: [name], [name] Other Clinician: Referring Provider: Treating Provider/Extender: Joeanne Robicheaux [name], [name] Weeks in Treatment: 211 Active Problems ICD-10 Encounter Code Description Active Date MDM Diagnosis E11.621 Type 2 diabetes mellitus with foot ulcer 04/17/2016 No Yes L97.221 Non-pressure chronic ulcer of left calf limited to breakdown of skin 08/15/2017 No Yes L97.321 Non-pressure chronic ulcer of left ankle limited to breakdown of skin 07/01/2018 No Yes L97.521 Non-pressure chronic ulcer of other part of left foot limited to breakdown of 02/17/2019 No Yes skin L97.211 Non-pressure chronic ulcer of right calf limited to breakdown of skin 07/21/2019 No Yes Inactive Problems ICD-10 Code Description Active Date Inactive Date L89.613 Pressure ulcer of right heel, stage 3 04/17/2016 04/17/2016 L03.032 Cellulitis of left toe 01/06/2019 01/06/2019 K02.542 Pressure ulcer of left heel, stage 3 12/04/2016 12/04/2016 I25.119 Atherosclerotic heart disease of native coronary artery with unspecified angina 04/17/2016  04/17/2016 pectoris L97.821 Non-pressure chronic ulcer of other part of left lower leg limited to breakdown of skin 01/06/2019 01/06/2019 S51.811D Laceration without foreign body of right forearm, subsequent encounter 10/22/2017 10/22/2017 H06.23 Acute diastolic (congestive) heart failure 04/17/2016 04/17/2016 L03.116 Cellulitis of left lower limb 12/24/2017 12/24/2017 L89.620 Pressure ulcer of left heel, unstageable 07/21/2019 07/21/2019 S80.211D Abrasion, right knee, subsequent encounter 08/18/2019 08/18/2019 Resolved Problems ICD-10 Code Description Active Date Resolved Date L89.512 Pressure ulcer of right ankle, stage 2 04/17/2016 04/17/2016 L89.522 Pressure ulcer of left ankle, stage 2 04/17/2016 04/17/2016 Electronic Signature(s) Signed: 05/03/2020 5:07:05 PM By: Linton Ham MD Entered By: Linton Ham on 05/03/2020 09:16:17 -------------------------------------------------------------------------------- Progress Note Details Patient Name: Date of Service: [name] [name] F. 05/03/2020 8:30 A M Medical Record Number: [id number] Patient Account Number: [id number] Date of Birth/Sex: Treating RN: May 09, 1945 (75 y.o. [name], [name] Primary Care Provider: [name], [name] Other Clinician: Referring Provider: Treating Provider/Extender: Loxley Cibrian [name], [name] Weeks in Treatment: 211 Subjective History of Present Illness (HPI) The following HPI elements were documented for the patient's wound: Location: On the left and right lateral forefoot which has been there for about 6 months Quality: Patient reports No Pain. Severity: Patient states wound(s) are getting worse. Duration: Patient has had the wound for > 6 months prior to seeking treatment at the wound center Context: The wound would happen gradually Modifying Factors:  May shower and wash wound with dial antibacterial soap and water prior to dressing change. Peri-Wound Care: Sween Lotion (Moisturizing lotion) 1 x Per Week/15 Days Discharge Instructions: Apply moisturizing lotion as directed Prim Dressing: KerraCel Ag Gelling Fiber Dressing, 4x5 in (silver alginate) 1 x Per Week/15 Days ary Discharge Instructions: Apply silver alginate to wound bed as instructed Secondary Dressing: Woven Gauze Sponge, Non-Sterile 4x4 in 1 x Per Week/15 Days Discharge Instructions: Apply over primary dressing as directed. Secondary Dressing: ABD Pad, 5x9 1 x Per Week/15 Days Discharge Instructions: Apply over primary dressing as  directed. Secured With: Coban Self-Adherent Wrap 4x5 (in/yd) 1 x Per Week/15 Days Discharge Instructions: Secure with Coban as directed. Secured With: [employer], 4.5x3.1 (in/yd) 1 x Per Week/15 Days Discharge Instructions: Secure with Kerlix as directed. Services and Therapies nkle Brachial Index (ABI) - with TBI's-bilateral A Electronic Signature(s) Signed: 05/03/2020 5:07:05 PM By: Linton Ham MD Signed: 05/03/2020 5:35:07 PM By: [name] Entered By: [name] on 05/03/2020 14:50:26 -------------------------------------------------------------------------------- Problem List Details Patient Name: Date of Service: [name] [name] F. 05/03/2020 8:30 A M Medical Record Number: [id number] Patient Account Number: [id number] Date of Birth/Sex: Treating RN: 30-Jan-1946 (75 y.o. [name], [name] Primary Care Provider: [name], [name] Other Clinician: Referring Provider: Treating Provider/Extender: Joeanne Robicheaux [name], [name] Weeks in Treatment: 211 Active Problems ICD-10 Encounter Code Description Active Date MDM Diagnosis E11.621 Type 2 diabetes mellitus with foot ulcer 04/17/2016 No Yes L97.221 Non-pressure chronic ulcer of left calf limited to breakdown of skin 08/15/2017 No Yes L97.321 Non-pressure chronic ulcer of left ankle limited to breakdown of skin 07/01/2018 No Yes L97.521 Non-pressure chronic ulcer of other part of left foot limited to breakdown of 02/17/2019 No Yes skin L97.211 Non-pressure chronic ulcer of right calf limited to breakdown of skin 07/21/2019 No Yes Inactive Problems ICD-10 Code Description Active Date Inactive Date L89.613 Pressure ulcer of right heel, stage 3 04/17/2016 04/17/2016 L03.032 Cellulitis of left toe 01/06/2019 01/06/2019 K02.542 Pressure ulcer of left heel, stage 3 12/04/2016 12/04/2016 I25.119 Atherosclerotic heart disease of native coronary artery with unspecified angina 04/17/2016  04/17/2016 pectoris L97.821 Non-pressure chronic ulcer of other part of left lower leg limited to breakdown of skin 01/06/2019 01/06/2019 S51.811D Laceration without foreign body of right forearm, subsequent encounter 10/22/2017 10/22/2017 H06.23 Acute diastolic (congestive) heart failure 04/17/2016 04/17/2016 L03.116 Cellulitis of left lower limb 12/24/2017 12/24/2017 L89.620 Pressure ulcer of left heel, unstageable 07/21/2019 07/21/2019 S80.211D Abrasion, right knee, subsequent encounter 08/18/2019 08/18/2019 Resolved Problems ICD-10 Code Description Active Date Resolved Date L89.512 Pressure ulcer of right ankle, stage 2 04/17/2016 04/17/2016 L89.522 Pressure ulcer of left ankle, stage 2 04/17/2016 04/17/2016 Electronic Signature(s) Signed: 05/03/2020 5:07:05 PM By: Linton Ham MD Entered By: Linton Ham on 05/03/2020 09:16:17 -------------------------------------------------------------------------------- Progress Note Details Patient Name: Date of Service: [name] [name] F. 05/03/2020 8:30 A M Medical Record Number: [id number] Patient Account Number: [id number] Date of Birth/Sex: Treating RN: May 09, 1945 (75 y.o. [name], [name] Primary Care Provider: [name], [name] Other Clinician: Referring Provider: Treating Provider/Extender: Loxley Cibrian [name], [name] Weeks in Treatment: 211 Subjective History of Present Illness (HPI) The following HPI elements were documented for the patient's wound: Location: On the left and right lateral forefoot which has been there for about 6 months Quality: Patient reports No Pain. Severity: Patient states wound(s) are getting worse. Duration: Patient has had the wound for > 6 months prior to seeking treatment at the wound center Context: The wound would happen gradually Modifying Factors:  May shower and wash wound with dial antibacterial soap and water prior to dressing change. Peri-Wound Care: Sween Lotion (Moisturizing lotion) 1 x Per Week/15 Days Discharge Instructions: Apply moisturizing lotion as directed Prim Dressing: KerraCel Ag Gelling Fiber Dressing, 4x5 in (silver alginate) 1 x Per Week/15 Days ary Discharge Instructions: Apply silver alginate to wound bed as instructed Secondary Dressing: Woven Gauze Sponge, Non-Sterile 4x4 in 1 x Per Week/15 Days Discharge Instructions: Apply over primary dressing as directed. Secondary Dressing: ABD Pad, 5x9 1 x Per Week/15 Days Discharge Instructions: Apply over primary dressing as  directed. Secured With: Coban Self-Adherent Wrap 4x5 (in/yd) 1 x Per Week/15 Days Discharge Instructions: Secure with Coban as directed. Secured With: [employer], 4.5x3.1 (in/yd) 1 x Per Week/15 Days Discharge Instructions: Secure with Kerlix as directed. Services and Therapies nkle Brachial Index (ABI) - with TBI's-bilateral A Electronic Signature(s) Signed: 05/03/2020 5:07:05 PM By: Linton Ham MD Signed: 05/03/2020 5:35:07 PM By: [name] Entered By: [name] on 05/03/2020 14:50:26 -------------------------------------------------------------------------------- Problem List Details Patient Name: Date of Service: [name] [name] F. 05/03/2020 8:30 A M Medical Record Number: [id number] Patient Account Number: [id number] Date of Birth/Sex: Treating RN: 30-Jan-1946 (75 y.o. [name], [name] Primary Care Provider: [name], [name] Other Clinician: Referring Provider: Treating Provider/Extender: Joeanne Robicheaux [name], [name] Weeks in Treatment: 211 Active Problems ICD-10 Encounter Code Description Active Date MDM Diagnosis E11.621 Type 2 diabetes mellitus with foot ulcer 04/17/2016 No Yes L97.221 Non-pressure chronic ulcer of left calf limited to breakdown of skin 08/15/2017 No Yes L97.321 Non-pressure chronic ulcer of left ankle limited to breakdown of skin 07/01/2018 No Yes L97.521 Non-pressure chronic ulcer of other part of left foot limited to breakdown of 02/17/2019 No Yes skin L97.211 Non-pressure chronic ulcer of right calf limited to breakdown of skin 07/21/2019 No Yes Inactive Problems ICD-10 Code Description Active Date Inactive Date L89.613 Pressure ulcer of right heel, stage 3 04/17/2016 04/17/2016 L03.032 Cellulitis of left toe 01/06/2019 01/06/2019 K02.542 Pressure ulcer of left heel, stage 3 12/04/2016 12/04/2016 I25.119 Atherosclerotic heart disease of native coronary artery with unspecified angina 04/17/2016  04/17/2016 pectoris L97.821 Non-pressure chronic ulcer of other part of left lower leg limited to breakdown of skin 01/06/2019 01/06/2019 S51.811D Laceration without foreign body of right forearm, subsequent encounter 10/22/2017 10/22/2017 H06.23 Acute diastolic (congestive) heart failure 04/17/2016 04/17/2016 L03.116 Cellulitis of left lower limb 12/24/2017 12/24/2017 L89.620 Pressure ulcer of left heel, unstageable 07/21/2019 07/21/2019 S80.211D Abrasion, right knee, subsequent encounter 08/18/2019 08/18/2019 Resolved Problems ICD-10 Code Description Active Date Resolved Date L89.512 Pressure ulcer of right ankle, stage 2 04/17/2016 04/17/2016 L89.522 Pressure ulcer of left ankle, stage 2 04/17/2016 04/17/2016 Electronic Signature(s) Signed: 05/03/2020 5:07:05 PM By: Linton Ham MD Entered By: Linton Ham on 05/03/2020 09:16:17 -------------------------------------------------------------------------------- Progress Note Details Patient Name: Date of Service: [name] [name] F. 05/03/2020 8:30 A M Medical Record Number: [id number] Patient Account Number: [id number] Date of Birth/Sex: Treating RN: May 09, 1945 (75 y.o. [name], [name] Primary Care Provider: [name], [name] Other Clinician: Referring Provider: Treating Provider/Extender: Loxley Cibrian [name], [name] Weeks in Treatment: 211 Subjective History of Present Illness (HPI) The following HPI elements were documented for the patient's wound: Location: On the left and right lateral forefoot which has been there for about 6 months Quality: Patient reports No Pain. Severity: Patient states wound(s) are getting worse. Duration: Patient has had the wound for > 6 months prior to seeking treatment at the wound center Context: The wound would happen gradually Modifying Factors:  DEADRICK, STIDD ([id number]) Visit Report for 05/03/2020 Debridement Details Patient Name: Date of Service: [name] [name] [facility] F. 05/03/2020 8:30 A M Medical Record Number: [id number] Patient Account Number: [id number] Date of Birth/Sex: Treating RN: [birth year] (75 y.o. [name], [name] Primary Care Provider: [name], [name] Other Clinician: Referring Provider: Treating Provider/Extender: Scarlettrose Costilow [name], [name] Weeks in Treatment: 211 Debridement Performed for Assessment: Wound #32 Left,Distal,Dorsal Foot Performed By: Physician [name], MD Debridement Type: Debridement Severity of Tissue Pre Debridement: Fat layer exposed Level of Consciousness (Pre-procedure): Awake and Alert Pre-procedure Verification/Time Out Yes - 09:13 Taken: Start Time: 09:13 Pain Control: Lidocaine T Area Debrided (L x W): otal 1.3 (cm) x 1.7 (cm) = 2.21 (cm) Tissue and other material debrided: Viable, Non-Viable, Slough, Subcutaneous, Slough Level: Skin/Subcutaneous Tissue Debridement Description: Excisional Instrument: Curette Bleeding: Minimum Hemostasis Achieved: Pressure End Time: 09:14 Procedural Pain: 0 Post Procedural Pain: 0 Response to Treatment: Procedure was tolerated well Level of Consciousness (Post- Awake and Alert procedure): Post Debridement Measurements of Total Wound Length: (cm) 1.3 Width: (cm) 1.7 Depth: (cm) 0.1 Volume: (cm) 0.174 Character of Wound/Ulcer Post Debridement: Improved Severity of Tissue Post Debridement: Fat layer exposed Post Procedure Diagnosis Same as Pre-procedure Electronic Signature(s) Signed: 05/03/2020 5:07:05 PM By: Linton Ham MD Signed: 05/03/2020 5:35:07 PM By: [name] Entered By: [name] on 05/03/2020 09:14:33 -------------------------------------------------------------------------------- Debridement Details Patient Name: Date of Service: [name] [name] F. 05/03/2020 8:30 A M Medical Record  Number: [id number] Patient Account Number: [id number] Date of Birth/Sex: Treating RN: Jan 06, 1946 (75 y.o. [name], [name] Primary Care Provider: [name], [name] Other Clinician: Referring Provider: Treating Provider/Extender: Kaitland Lewellyn [name], [name] Weeks in Treatment: 211 Debridement Performed for Assessment: Wound #26 Left,Dorsal Foot Performed By: Physician [name], MD Debridement Type: Debridement Severity of Tissue Pre Debridement: Fat layer exposed Level of Consciousness (Pre-procedure): Awake and Alert Pre-procedure Verification/Time Out Yes - 09:13 Taken: Start Time: 09:13 Pain Control: Lidocaine T Area Debrided (L x W): otal 4.2 (cm) x 3.5 (cm) = 14.7 (cm) Tissue and other material debrided: Viable, Non-Viable, Slough, Subcutaneous, Slough Level: Skin/Subcutaneous Tissue Debridement Description: Excisional Instrument: Curette Bleeding: Minimum Hemostasis Achieved: Pressure End Time: 09:14 Procedural Pain: 0 Post Procedural Pain: 0 Response to Treatment: Procedure was tolerated well Level of Consciousness (Post- Awake and Alert procedure): Post Debridement Measurements of Total Wound Length: (cm) 4.2 Width: (cm) 3.5 Depth: (cm) 0.1 Volume: (cm) 1.155 Character of Wound/Ulcer Post Debridement: Improved Severity of Tissue Post Debridement: Fat layer exposed Post Procedure Diagnosis Same as Pre-procedure Electronic Signature(s) Signed: 05/03/2020 5:07:05 PM By: Linton Ham MD Signed: 05/03/2020 5:35:07 PM By: [name] Entered By: Linton Ham on 05/03/2020 09:19:41 -------------------------------------------------------------------------------- HPI Details Patient Name: Date of Service: [name] [name] F. 05/03/2020 8:30 A M Medical Record Number: [id number] Patient Account Number: [id number] Date of Birth/Sex: Treating RN: [birth year] (75 y.o. [name] Primary Care Provider: [name], [name] Other  Clinician: Referring Provider: Treating Provider/Extender: Morayo Leven [name], [name] Weeks in Treatment: 211 History of Present Illness Location: On the left and right lateral forefoot which has been there for about 6 months Quality: Patient reports No Pain. Severity: Patient states wound(s) are getting worse. Duration: Patient has had the wound for > 6 months prior to seeking treatment at the wound center Context: The wound would happen gradually Modifying Factors: Patient wound(s)/ulcer(s) are worsening due to :continual drainage from the wound ssociated Signs and Symptoms: Patient reports having increase discharge. A HPI Description: This  healed. There is callus here however no open wounds. The substantial area on his left upper lateral tibia which has been so refractory looks a lot better. He still has multiple small open areas on the dorsal left foot and ankle. One of these at the crease of the foot and ankle is actually quite deep and has debris in the center. On the right leg he only has one small open area on the right lateral leg this is just about closed We apparently had the order of silver alginate they come in with polymen Ag. However given the fact that things look so much better I am going to continue with the latter 04/26/2020; patient returns to the clinic after about a 9 to 10-week hiatus. I am not exactly sure why. The patient says he had a cold and was sick. He was apparently tested for Covid but was  negative. He literally dropped off our schedule. He arrives back in clinic with a multitude of wounds especially on the left dorsal foot and lower leg. There are also areas on the right dorsal foot and the right lower leg. Fortunately the pressure ulcers on the bilateral lateral heels of once again closed. Again the history here is vague. Apparently everything is deteriorated on the left greater than right over the last 2 to 3 weeks. His daughter wondered whether they were not wrapping this properly they have been applying topical antibiotic and a kerlix Coban wrap. He absolutely denies any pain 1/11; I put him under our kerlix, Coban wrap and the edema control is a lot better. They have not been changing this at home. There has been some improvement in his wounds except on the left dorsal foot and ankle. In fact he has a new wound on the left dorsal foot. The last time he was here I reviewed his past arterial studies and I am going to repeat them. Through this timeframe the wounds on his bilateral heels, pressure ulcers have actually managed to heal. Electronic Signature(s) Signed: 05/03/2020 5:07:05 PM By: Linton Ham MD Entered By: Linton Ham on 05/03/2020 09:22:26 -------------------------------------------------------------------------------- Physical Exam Details Patient Name: Date of Service: [name] [name] F. 05/03/2020 8:30 A M Medical Record Number: [id number] Patient Account Number: [id number] Date of Birth/Sex: Treating RN: 05-Apr-1946 (75 y.o. [name] Primary Care Provider: [name], [name] Other Clinician: Referring Provider: Treating Provider/Extender: Rodnesha Elie [name], [name] Weeks in Treatment: 211 Constitutional Sitting or standing Blood Pressure is within target range for patient.. Pulse regular and within target range for patient.Marland Kitchen Respirations regular, non-labored and within target range.. Temperature is normal and within the target range for the  patient.Marland Kitchen Appears in no distress. Notes Wound exam; the areas on the dorsal foot continue to deteriorate. He has a new one distally the proximal 1 is necrotic removal of this reveals exposed tendon. I debrided both of these areas with a #5 curette hemostasis with direct pressure. The area on the left lateral tibia actually looks a lot better The wounds on the right looks smaller lesions are mostly on the right ankle and dorsal foot On both sides we have much better edema control Electronic Signature(s) Signed: 05/03/2020 5:07:05 PM By: Linton Ham MD Entered By: Linton Ham on 05/03/2020 09:25:53 -------------------------------------------------------------------------------- Physician Orders Details Patient Name: Date of Service: [name] [name] F. 05/03/2020 8:30 A M Medical Record Number: [id number] Patient Account Number: [id number] Date of Birth/Sex: Treating RN: 1946-02-19 (75 y.o. [name], [name] Primary Care Provider: [name], [name] Other  DEADRICK, STIDD ([id number]) Visit Report for 05/03/2020 Debridement Details Patient Name: Date of Service: [name] [name] [facility] F. 05/03/2020 8:30 A M Medical Record Number: [id number] Patient Account Number: [id number] Date of Birth/Sex: Treating RN: [birth year] (75 y.o. [name], [name] Primary Care Provider: [name], [name] Other Clinician: Referring Provider: Treating Provider/Extender: Scarlettrose Costilow [name], [name] Weeks in Treatment: 211 Debridement Performed for Assessment: Wound #32 Left,Distal,Dorsal Foot Performed By: Physician [name], MD Debridement Type: Debridement Severity of Tissue Pre Debridement: Fat layer exposed Level of Consciousness (Pre-procedure): Awake and Alert Pre-procedure Verification/Time Out Yes - 09:13 Taken: Start Time: 09:13 Pain Control: Lidocaine T Area Debrided (L x W): otal 1.3 (cm) x 1.7 (cm) = 2.21 (cm) Tissue and other material debrided: Viable, Non-Viable, Slough, Subcutaneous, Slough Level: Skin/Subcutaneous Tissue Debridement Description: Excisional Instrument: Curette Bleeding: Minimum Hemostasis Achieved: Pressure End Time: 09:14 Procedural Pain: 0 Post Procedural Pain: 0 Response to Treatment: Procedure was tolerated well Level of Consciousness (Post- Awake and Alert procedure): Post Debridement Measurements of Total Wound Length: (cm) 1.3 Width: (cm) 1.7 Depth: (cm) 0.1 Volume: (cm) 0.174 Character of Wound/Ulcer Post Debridement: Improved Severity of Tissue Post Debridement: Fat layer exposed Post Procedure Diagnosis Same as Pre-procedure Electronic Signature(s) Signed: 05/03/2020 5:07:05 PM By: Linton Ham MD Signed: 05/03/2020 5:35:07 PM By: [name] Entered By: [name] on 05/03/2020 09:14:33 -------------------------------------------------------------------------------- Debridement Details Patient Name: Date of Service: [name] [name] F. 05/03/2020 8:30 A M Medical Record  Number: [id number] Patient Account Number: [id number] Date of Birth/Sex: Treating RN: Jan 06, 1946 (75 y.o. [name], [name] Primary Care Provider: [name], [name] Other Clinician: Referring Provider: Treating Provider/Extender: Kaitland Lewellyn [name], [name] Weeks in Treatment: 211 Debridement Performed for Assessment: Wound #26 Left,Dorsal Foot Performed By: Physician [name], MD Debridement Type: Debridement Severity of Tissue Pre Debridement: Fat layer exposed Level of Consciousness (Pre-procedure): Awake and Alert Pre-procedure Verification/Time Out Yes - 09:13 Taken: Start Time: 09:13 Pain Control: Lidocaine T Area Debrided (L x W): otal 4.2 (cm) x 3.5 (cm) = 14.7 (cm) Tissue and other material debrided: Viable, Non-Viable, Slough, Subcutaneous, Slough Level: Skin/Subcutaneous Tissue Debridement Description: Excisional Instrument: Curette Bleeding: Minimum Hemostasis Achieved: Pressure End Time: 09:14 Procedural Pain: 0 Post Procedural Pain: 0 Response to Treatment: Procedure was tolerated well Level of Consciousness (Post- Awake and Alert procedure): Post Debridement Measurements of Total Wound Length: (cm) 4.2 Width: (cm) 3.5 Depth: (cm) 0.1 Volume: (cm) 1.155 Character of Wound/Ulcer Post Debridement: Improved Severity of Tissue Post Debridement: Fat layer exposed Post Procedure Diagnosis Same as Pre-procedure Electronic Signature(s) Signed: 05/03/2020 5:07:05 PM By: Linton Ham MD Signed: 05/03/2020 5:35:07 PM By: [name] Entered By: Linton Ham on 05/03/2020 09:19:41 -------------------------------------------------------------------------------- HPI Details Patient Name: Date of Service: [name] [name] F. 05/03/2020 8:30 A M Medical Record Number: [id number] Patient Account Number: [id number] Date of Birth/Sex: Treating RN: [birth year] (75 y.o. [name] Primary Care Provider: [name], [name] Other  Clinician: Referring Provider: Treating Provider/Extender: Morayo Leven [name], [name] Weeks in Treatment: 211 History of Present Illness Location: On the left and right lateral forefoot which has been there for about 6 months Quality: Patient reports No Pain. Severity: Patient states wound(s) are getting worse. Duration: Patient has had the wound for > 6 months prior to seeking treatment at the wound center Context: The wound would happen gradually Modifying Factors: Patient wound(s)/ulcer(s) are worsening due to :continual drainage from the wound ssociated Signs and Symptoms: Patient reports having increase discharge. A HPI Description: This  healed. There is callus here however no open wounds. The substantial area on his left upper lateral tibia which has been so refractory looks a lot better. He still has multiple small open areas on the dorsal left foot and ankle. One of these at the crease of the foot and ankle is actually quite deep and has debris in the center. On the right leg he only has one small open area on the right lateral leg this is just about closed We apparently had the order of silver alginate they come in with polymen Ag. However given the fact that things look so much better I am going to continue with the latter 04/26/2020; patient returns to the clinic after about a 9 to 10-week hiatus. I am not exactly sure why. The patient says he had a cold and was sick. He was apparently tested for Covid but was  negative. He literally dropped off our schedule. He arrives back in clinic with a multitude of wounds especially on the left dorsal foot and lower leg. There are also areas on the right dorsal foot and the right lower leg. Fortunately the pressure ulcers on the bilateral lateral heels of once again closed. Again the history here is vague. Apparently everything is deteriorated on the left greater than right over the last 2 to 3 weeks. His daughter wondered whether they were not wrapping this properly they have been applying topical antibiotic and a kerlix Coban wrap. He absolutely denies any pain 1/11; I put him under our kerlix, Coban wrap and the edema control is a lot better. They have not been changing this at home. There has been some improvement in his wounds except on the left dorsal foot and ankle. In fact he has a new wound on the left dorsal foot. The last time he was here I reviewed his past arterial studies and I am going to repeat them. Through this timeframe the wounds on his bilateral heels, pressure ulcers have actually managed to heal. Electronic Signature(s) Signed: 05/03/2020 5:07:05 PM By: Linton Ham MD Entered By: Linton Ham on 05/03/2020 09:22:26 -------------------------------------------------------------------------------- Physical Exam Details Patient Name: Date of Service: [name] [name] F. 05/03/2020 8:30 A M Medical Record Number: [id number] Patient Account Number: [id number] Date of Birth/Sex: Treating RN: 05-Apr-1946 (75 y.o. [name] Primary Care Provider: [name], [name] Other Clinician: Referring Provider: Treating Provider/Extender: Rodnesha Elie [name], [name] Weeks in Treatment: 211 Constitutional Sitting or standing Blood Pressure is within target range for patient.. Pulse regular and within target range for patient.Marland Kitchen Respirations regular, non-labored and within target range.. Temperature is normal and within the target range for the  patient.Marland Kitchen Appears in no distress. Notes Wound exam; the areas on the dorsal foot continue to deteriorate. He has a new one distally the proximal 1 is necrotic removal of this reveals exposed tendon. I debrided both of these areas with a #5 curette hemostasis with direct pressure. The area on the left lateral tibia actually looks a lot better The wounds on the right looks smaller lesions are mostly on the right ankle and dorsal foot On both sides we have much better edema control Electronic Signature(s) Signed: 05/03/2020 5:07:05 PM By: Linton Ham MD Entered By: Linton Ham on 05/03/2020 09:25:53 -------------------------------------------------------------------------------- Physician Orders Details Patient Name: Date of Service: [name] [name] F. 05/03/2020 8:30 A M Medical Record Number: [id number] Patient Account Number: [id number] Date of Birth/Sex: Treating RN: 1946-02-19 (75 y.o. [name], [name] Primary Care Provider: [name], [name] Other  healed. There is callus here however no open wounds. The substantial area on his left upper lateral tibia which has been so refractory looks a lot better. He still has multiple small open areas on the dorsal left foot and ankle. One of these at the crease of the foot and ankle is actually quite deep and has debris in the center. On the right leg he only has one small open area on the right lateral leg this is just about closed We apparently had the order of silver alginate they come in with polymen Ag. However given the fact that things look so much better I am going to continue with the latter 04/26/2020; patient returns to the clinic after about a 9 to 10-week hiatus. I am not exactly sure why. The patient says he had a cold and was sick. He was apparently tested for Covid but was  negative. He literally dropped off our schedule. He arrives back in clinic with a multitude of wounds especially on the left dorsal foot and lower leg. There are also areas on the right dorsal foot and the right lower leg. Fortunately the pressure ulcers on the bilateral lateral heels of once again closed. Again the history here is vague. Apparently everything is deteriorated on the left greater than right over the last 2 to 3 weeks. His daughter wondered whether they were not wrapping this properly they have been applying topical antibiotic and a kerlix Coban wrap. He absolutely denies any pain 1/11; I put him under our kerlix, Coban wrap and the edema control is a lot better. They have not been changing this at home. There has been some improvement in his wounds except on the left dorsal foot and ankle. In fact he has a new wound on the left dorsal foot. The last time he was here I reviewed his past arterial studies and I am going to repeat them. Through this timeframe the wounds on his bilateral heels, pressure ulcers have actually managed to heal. Electronic Signature(s) Signed: 05/03/2020 5:07:05 PM By: Linton Ham MD Entered By: Linton Ham on 05/03/2020 09:22:26 -------------------------------------------------------------------------------- Physical Exam Details Patient Name: Date of Service: [name] [name] F. 05/03/2020 8:30 A M Medical Record Number: [id number] Patient Account Number: [id number] Date of Birth/Sex: Treating RN: 05-Apr-1946 (75 y.o. [name] Primary Care Provider: [name], [name] Other Clinician: Referring Provider: Treating Provider/Extender: Rodnesha Elie [name], [name] Weeks in Treatment: 211 Constitutional Sitting or standing Blood Pressure is within target range for patient.. Pulse regular and within target range for patient.Marland Kitchen Respirations regular, non-labored and within target range.. Temperature is normal and within the target range for the  patient.Marland Kitchen Appears in no distress. Notes Wound exam; the areas on the dorsal foot continue to deteriorate. He has a new one distally the proximal 1 is necrotic removal of this reveals exposed tendon. I debrided both of these areas with a #5 curette hemostasis with direct pressure. The area on the left lateral tibia actually looks a lot better The wounds on the right looks smaller lesions are mostly on the right ankle and dorsal foot On both sides we have much better edema control Electronic Signature(s) Signed: 05/03/2020 5:07:05 PM By: Linton Ham MD Entered By: Linton Ham on 05/03/2020 09:25:53 -------------------------------------------------------------------------------- Physician Orders Details Patient Name: Date of Service: [name] [name] F. 05/03/2020 8:30 A M Medical Record Number: [id number] Patient Account Number: [id number] Date of Birth/Sex: Treating RN: 1946-02-19 (75 y.o. [name], [name] Primary Care Provider: [name], [name] Other  May shower and wash wound with dial antibacterial soap and water prior to dressing change. Peri-Wound Care: Sween Lotion (Moisturizing lotion) 1 x Per Week/15 Days Discharge Instructions: Apply moisturizing lotion as directed Prim Dressing: KerraCel Ag Gelling Fiber Dressing, 4x5 in (silver alginate) 1 x Per Week/15 Days ary Discharge Instructions: Apply silver alginate to wound bed as instructed Secondary Dressing: Woven Gauze Sponge, Non-Sterile 4x4 in 1 x Per Week/15 Days Discharge Instructions: Apply over primary dressing as directed. Secondary Dressing: ABD Pad, 5x9 1 x Per Week/15 Days Discharge Instructions: Apply over primary dressing as  directed. Secured With: Coban Self-Adherent Wrap 4x5 (in/yd) 1 x Per Week/15 Days Discharge Instructions: Secure with Coban as directed. Secured With: [employer], 4.5x3.1 (in/yd) 1 x Per Week/15 Days Discharge Instructions: Secure with Kerlix as directed. Services and Therapies nkle Brachial Index (ABI) - with TBI's-bilateral A Electronic Signature(s) Signed: 05/03/2020 5:07:05 PM By: Linton Ham MD Signed: 05/03/2020 5:35:07 PM By: [name] Entered By: [name] on 05/03/2020 14:50:26 -------------------------------------------------------------------------------- Problem List Details Patient Name: Date of Service: [name] [name] F. 05/03/2020 8:30 A M Medical Record Number: [id number] Patient Account Number: [id number] Date of Birth/Sex: Treating RN: 30-Jan-1946 (75 y.o. [name], [name] Primary Care Provider: [name], [name] Other Clinician: Referring Provider: Treating Provider/Extender: Joeanne Robicheaux [name], [name] Weeks in Treatment: 211 Active Problems ICD-10 Encounter Code Description Active Date MDM Diagnosis E11.621 Type 2 diabetes mellitus with foot ulcer 04/17/2016 No Yes L97.221 Non-pressure chronic ulcer of left calf limited to breakdown of skin 08/15/2017 No Yes L97.321 Non-pressure chronic ulcer of left ankle limited to breakdown of skin 07/01/2018 No Yes L97.521 Non-pressure chronic ulcer of other part of left foot limited to breakdown of 02/17/2019 No Yes skin L97.211 Non-pressure chronic ulcer of right calf limited to breakdown of skin 07/21/2019 No Yes Inactive Problems ICD-10 Code Description Active Date Inactive Date L89.613 Pressure ulcer of right heel, stage 3 04/17/2016 04/17/2016 L03.032 Cellulitis of left toe 01/06/2019 01/06/2019 K02.542 Pressure ulcer of left heel, stage 3 12/04/2016 12/04/2016 I25.119 Atherosclerotic heart disease of native coronary artery with unspecified angina 04/17/2016  04/17/2016 pectoris L97.821 Non-pressure chronic ulcer of other part of left lower leg limited to breakdown of skin 01/06/2019 01/06/2019 S51.811D Laceration without foreign body of right forearm, subsequent encounter 10/22/2017 10/22/2017 H06.23 Acute diastolic (congestive) heart failure 04/17/2016 04/17/2016 L03.116 Cellulitis of left lower limb 12/24/2017 12/24/2017 L89.620 Pressure ulcer of left heel, unstageable 07/21/2019 07/21/2019 S80.211D Abrasion, right knee, subsequent encounter 08/18/2019 08/18/2019 Resolved Problems ICD-10 Code Description Active Date Resolved Date L89.512 Pressure ulcer of right ankle, stage 2 04/17/2016 04/17/2016 L89.522 Pressure ulcer of left ankle, stage 2 04/17/2016 04/17/2016 Electronic Signature(s) Signed: 05/03/2020 5:07:05 PM By: Linton Ham MD Entered By: Linton Ham on 05/03/2020 09:16:17 -------------------------------------------------------------------------------- Progress Note Details Patient Name: Date of Service: [name] [name] F. 05/03/2020 8:30 A M Medical Record Number: [id number] Patient Account Number: [id number] Date of Birth/Sex: Treating RN: May 09, 1945 (75 y.o. [name], [name] Primary Care Provider: [name], [name] Other Clinician: Referring Provider: Treating Provider/Extender: Loxley Cibrian [name], [name] Weeks in Treatment: 211 Subjective History of Present Illness (HPI) The following HPI elements were documented for the patient's wound: Location: On the left and right lateral forefoot which has been there for about 6 months Quality: Patient reports No Pain. Severity: Patient states wound(s) are getting worse. Duration: Patient has had the wound for > 6 months prior to seeking treatment at the wound center Context: The wound would happen gradually Modifying Factors:  DEADRICK, STIDD ([id number]) Visit Report for 05/03/2020 Debridement Details Patient Name: Date of Service: [name] [name] [facility] F. 05/03/2020 8:30 A M Medical Record Number: [id number] Patient Account Number: [id number] Date of Birth/Sex: Treating RN: [birth year] (75 y.o. [name], [name] Primary Care Provider: [name], [name] Other Clinician: Referring Provider: Treating Provider/Extender: Scarlettrose Costilow [name], [name] Weeks in Treatment: 211 Debridement Performed for Assessment: Wound #32 Left,Distal,Dorsal Foot Performed By: Physician [name], MD Debridement Type: Debridement Severity of Tissue Pre Debridement: Fat layer exposed Level of Consciousness (Pre-procedure): Awake and Alert Pre-procedure Verification/Time Out Yes - 09:13 Taken: Start Time: 09:13 Pain Control: Lidocaine T Area Debrided (L x W): otal 1.3 (cm) x 1.7 (cm) = 2.21 (cm) Tissue and other material debrided: Viable, Non-Viable, Slough, Subcutaneous, Slough Level: Skin/Subcutaneous Tissue Debridement Description: Excisional Instrument: Curette Bleeding: Minimum Hemostasis Achieved: Pressure End Time: 09:14 Procedural Pain: 0 Post Procedural Pain: 0 Response to Treatment: Procedure was tolerated well Level of Consciousness (Post- Awake and Alert procedure): Post Debridement Measurements of Total Wound Length: (cm) 1.3 Width: (cm) 1.7 Depth: (cm) 0.1 Volume: (cm) 0.174 Character of Wound/Ulcer Post Debridement: Improved Severity of Tissue Post Debridement: Fat layer exposed Post Procedure Diagnosis Same as Pre-procedure Electronic Signature(s) Signed: 05/03/2020 5:07:05 PM By: Linton Ham MD Signed: 05/03/2020 5:35:07 PM By: [name] Entered By: [name] on 05/03/2020 09:14:33 -------------------------------------------------------------------------------- Debridement Details Patient Name: Date of Service: [name] [name] F. 05/03/2020 8:30 A M Medical Record  Number: [id number] Patient Account Number: [id number] Date of Birth/Sex: Treating RN: Jan 06, 1946 (75 y.o. [name], [name] Primary Care Provider: [name], [name] Other Clinician: Referring Provider: Treating Provider/Extender: Kaitland Lewellyn [name], [name] Weeks in Treatment: 211 Debridement Performed for Assessment: Wound #26 Left,Dorsal Foot Performed By: Physician [name], MD Debridement Type: Debridement Severity of Tissue Pre Debridement: Fat layer exposed Level of Consciousness (Pre-procedure): Awake and Alert Pre-procedure Verification/Time Out Yes - 09:13 Taken: Start Time: 09:13 Pain Control: Lidocaine T Area Debrided (L x W): otal 4.2 (cm) x 3.5 (cm) = 14.7 (cm) Tissue and other material debrided: Viable, Non-Viable, Slough, Subcutaneous, Slough Level: Skin/Subcutaneous Tissue Debridement Description: Excisional Instrument: Curette Bleeding: Minimum Hemostasis Achieved: Pressure End Time: 09:14 Procedural Pain: 0 Post Procedural Pain: 0 Response to Treatment: Procedure was tolerated well Level of Consciousness (Post- Awake and Alert procedure): Post Debridement Measurements of Total Wound Length: (cm) 4.2 Width: (cm) 3.5 Depth: (cm) 0.1 Volume: (cm) 1.155 Character of Wound/Ulcer Post Debridement: Improved Severity of Tissue Post Debridement: Fat layer exposed Post Procedure Diagnosis Same as Pre-procedure Electronic Signature(s) Signed: 05/03/2020 5:07:05 PM By: Linton Ham MD Signed: 05/03/2020 5:35:07 PM By: [name] Entered By: Linton Ham on 05/03/2020 09:19:41 -------------------------------------------------------------------------------- HPI Details Patient Name: Date of Service: [name] [name] F. 05/03/2020 8:30 A M Medical Record Number: [id number] Patient Account Number: [id number] Date of Birth/Sex: Treating RN: [birth year] (75 y.o. [name] Primary Care Provider: [name], [name] Other  Clinician: Referring Provider: Treating Provider/Extender: Morayo Leven [name], [name] Weeks in Treatment: 211 History of Present Illness Location: On the left and right lateral forefoot which has been there for about 6 months Quality: Patient reports No Pain. Severity: Patient states wound(s) are getting worse. Duration: Patient has had the wound for > 6 months prior to seeking treatment at the wound center Context: The wound would happen gradually Modifying Factors: Patient wound(s)/ulcer(s) are worsening due to :continual drainage from the wound ssociated Signs and Symptoms: Patient reports having increase discharge. A HPI Description: This  healed. There is callus here however no open wounds. The substantial area on his left upper lateral tibia which has been so refractory looks a lot better. He still has multiple small open areas on the dorsal left foot and ankle. One of these at the crease of the foot and ankle is actually quite deep and has debris in the center. On the right leg he only has one small open area on the right lateral leg this is just about closed We apparently had the order of silver alginate they come in with polymen Ag. However given the fact that things look so much better I am going to continue with the latter 04/26/2020; patient returns to the clinic after about a 9 to 10-week hiatus. I am not exactly sure why. The patient says he had a cold and was sick. He was apparently tested for Covid but was  negative. He literally dropped off our schedule. He arrives back in clinic with a multitude of wounds especially on the left dorsal foot and lower leg. There are also areas on the right dorsal foot and the right lower leg. Fortunately the pressure ulcers on the bilateral lateral heels of once again closed. Again the history here is vague. Apparently everything is deteriorated on the left greater than right over the last 2 to 3 weeks. His daughter wondered whether they were not wrapping this properly they have been applying topical antibiotic and a kerlix Coban wrap. He absolutely denies any pain 1/11; I put him under our kerlix, Coban wrap and the edema control is a lot better. They have not been changing this at home. There has been some improvement in his wounds except on the left dorsal foot and ankle. In fact he has a new wound on the left dorsal foot. The last time he was here I reviewed his past arterial studies and I am going to repeat them. Through this timeframe the wounds on his bilateral heels, pressure ulcers have actually managed to heal. Electronic Signature(s) Signed: 05/03/2020 5:07:05 PM By: Linton Ham MD Entered By: Linton Ham on 05/03/2020 09:22:26 -------------------------------------------------------------------------------- Physical Exam Details Patient Name: Date of Service: [name] [name] F. 05/03/2020 8:30 A M Medical Record Number: [id number] Patient Account Number: [id number] Date of Birth/Sex: Treating RN: 05-Apr-1946 (75 y.o. [name] Primary Care Provider: [name], [name] Other Clinician: Referring Provider: Treating Provider/Extender: Rodnesha Elie [name], [name] Weeks in Treatment: 211 Constitutional Sitting or standing Blood Pressure is within target range for patient.. Pulse regular and within target range for patient.Marland Kitchen Respirations regular, non-labored and within target range.. Temperature is normal and within the target range for the  patient.Marland Kitchen Appears in no distress. Notes Wound exam; the areas on the dorsal foot continue to deteriorate. He has a new one distally the proximal 1 is necrotic removal of this reveals exposed tendon. I debrided both of these areas with a #5 curette hemostasis with direct pressure. The area on the left lateral tibia actually looks a lot better The wounds on the right looks smaller lesions are mostly on the right ankle and dorsal foot On both sides we have much better edema control Electronic Signature(s) Signed: 05/03/2020 5:07:05 PM By: Linton Ham MD Entered By: Linton Ham on 05/03/2020 09:25:53 -------------------------------------------------------------------------------- Physician Orders Details Patient Name: Date of Service: [name] [name] F. 05/03/2020 8:30 A M Medical Record Number: [id number] Patient Account Number: [id number] Date of Birth/Sex: Treating RN: 1946-02-19 (75 y.o. [name], [name] Primary Care Provider: [name], [name] Other  May shower and wash wound with dial antibacterial soap and water prior to dressing change. Peri-Wound Care: Sween Lotion (Moisturizing lotion) 1 x Per Week/15 Days Discharge Instructions: Apply moisturizing lotion as directed Prim Dressing: KerraCel Ag Gelling Fiber Dressing, 4x5 in (silver alginate) 1 x Per Week/15 Days ary Discharge Instructions: Apply silver alginate to wound bed as instructed Secondary Dressing: Woven Gauze Sponge, Non-Sterile 4x4 in 1 x Per Week/15 Days Discharge Instructions: Apply over primary dressing as directed. Secondary Dressing: ABD Pad, 5x9 1 x Per Week/15 Days Discharge Instructions: Apply over primary dressing as  directed. Secured With: Coban Self-Adherent Wrap 4x5 (in/yd) 1 x Per Week/15 Days Discharge Instructions: Secure with Coban as directed. Secured With: [employer], 4.5x3.1 (in/yd) 1 x Per Week/15 Days Discharge Instructions: Secure with Kerlix as directed. Services and Therapies nkle Brachial Index (ABI) - with TBI's-bilateral A Electronic Signature(s) Signed: 05/03/2020 5:07:05 PM By: Linton Ham MD Signed: 05/03/2020 5:35:07 PM By: [name] Entered By: [name] on 05/03/2020 14:50:26 -------------------------------------------------------------------------------- Problem List Details Patient Name: Date of Service: [name] [name] F. 05/03/2020 8:30 A M Medical Record Number: [id number] Patient Account Number: [id number] Date of Birth/Sex: Treating RN: 30-Jan-1946 (75 y.o. [name], [name] Primary Care Provider: [name], [name] Other Clinician: Referring Provider: Treating Provider/Extender: Joeanne Robicheaux [name], [name] Weeks in Treatment: 211 Active Problems ICD-10 Encounter Code Description Active Date MDM Diagnosis E11.621 Type 2 diabetes mellitus with foot ulcer 04/17/2016 No Yes L97.221 Non-pressure chronic ulcer of left calf limited to breakdown of skin 08/15/2017 No Yes L97.321 Non-pressure chronic ulcer of left ankle limited to breakdown of skin 07/01/2018 No Yes L97.521 Non-pressure chronic ulcer of other part of left foot limited to breakdown of 02/17/2019 No Yes skin L97.211 Non-pressure chronic ulcer of right calf limited to breakdown of skin 07/21/2019 No Yes Inactive Problems ICD-10 Code Description Active Date Inactive Date L89.613 Pressure ulcer of right heel, stage 3 04/17/2016 04/17/2016 L03.032 Cellulitis of left toe 01/06/2019 01/06/2019 K02.542 Pressure ulcer of left heel, stage 3 12/04/2016 12/04/2016 I25.119 Atherosclerotic heart disease of native coronary artery with unspecified angina 04/17/2016  04/17/2016 pectoris L97.821 Non-pressure chronic ulcer of other part of left lower leg limited to breakdown of skin 01/06/2019 01/06/2019 S51.811D Laceration without foreign body of right forearm, subsequent encounter 10/22/2017 10/22/2017 H06.23 Acute diastolic (congestive) heart failure 04/17/2016 04/17/2016 L03.116 Cellulitis of left lower limb 12/24/2017 12/24/2017 L89.620 Pressure ulcer of left heel, unstageable 07/21/2019 07/21/2019 S80.211D Abrasion, right knee, subsequent encounter 08/18/2019 08/18/2019 Resolved Problems ICD-10 Code Description Active Date Resolved Date L89.512 Pressure ulcer of right ankle, stage 2 04/17/2016 04/17/2016 L89.522 Pressure ulcer of left ankle, stage 2 04/17/2016 04/17/2016 Electronic Signature(s) Signed: 05/03/2020 5:07:05 PM By: Linton Ham MD Entered By: Linton Ham on 05/03/2020 09:16:17 -------------------------------------------------------------------------------- Progress Note Details Patient Name: Date of Service: [name] [name] F. 05/03/2020 8:30 A M Medical Record Number: [id number] Patient Account Number: [id number] Date of Birth/Sex: Treating RN: May 09, 1945 (75 y.o. [name], [name] Primary Care Provider: [name], [name] Other Clinician: Referring Provider: Treating Provider/Extender: Loxley Cibrian [name], [name] Weeks in Treatment: 211 Subjective History of Present Illness (HPI) The following HPI elements were documented for the patient's wound: Location: On the left and right lateral forefoot which has been there for about 6 months Quality: Patient reports No Pain. Severity: Patient states wound(s) are getting worse. Duration: Patient has had the wound for > 6 months prior to seeking treatment at the wound center Context: The wound would happen gradually Modifying Factors:  healed. There is callus here however no open wounds. The substantial area on his left upper lateral tibia which has been so refractory looks a lot better. He still has multiple small open areas on the dorsal left foot and ankle. One of these at the crease of the foot and ankle is actually quite deep and has debris in the center. On the right leg he only has one small open area on the right lateral leg this is just about closed We apparently had the order of silver alginate they come in with polymen Ag. However given the fact that things look so much better I am going to continue with the latter 04/26/2020; patient returns to the clinic after about a 9 to 10-week hiatus. I am not exactly sure why. The patient says he had a cold and was sick. He was apparently tested for Covid but was  negative. He literally dropped off our schedule. He arrives back in clinic with a multitude of wounds especially on the left dorsal foot and lower leg. There are also areas on the right dorsal foot and the right lower leg. Fortunately the pressure ulcers on the bilateral lateral heels of once again closed. Again the history here is vague. Apparently everything is deteriorated on the left greater than right over the last 2 to 3 weeks. His daughter wondered whether they were not wrapping this properly they have been applying topical antibiotic and a kerlix Coban wrap. He absolutely denies any pain 1/11; I put him under our kerlix, Coban wrap and the edema control is a lot better. They have not been changing this at home. There has been some improvement in his wounds except on the left dorsal foot and ankle. In fact he has a new wound on the left dorsal foot. The last time he was here I reviewed his past arterial studies and I am going to repeat them. Through this timeframe the wounds on his bilateral heels, pressure ulcers have actually managed to heal. Electronic Signature(s) Signed: 05/03/2020 5:07:05 PM By: Linton Ham MD Entered By: Linton Ham on 05/03/2020 09:22:26 -------------------------------------------------------------------------------- Physical Exam Details Patient Name: Date of Service: [name] [name] F. 05/03/2020 8:30 A M Medical Record Number: [id number] Patient Account Number: [id number] Date of Birth/Sex: Treating RN: 05-Apr-1946 (75 y.o. [name] Primary Care Provider: [name], [name] Other Clinician: Referring Provider: Treating Provider/Extender: Rodnesha Elie [name], [name] Weeks in Treatment: 211 Constitutional Sitting or standing Blood Pressure is within target range for patient.. Pulse regular and within target range for patient.Marland Kitchen Respirations regular, non-labored and within target range.. Temperature is normal and within the target range for the  patient.Marland Kitchen Appears in no distress. Notes Wound exam; the areas on the dorsal foot continue to deteriorate. He has a new one distally the proximal 1 is necrotic removal of this reveals exposed tendon. I debrided both of these areas with a #5 curette hemostasis with direct pressure. The area on the left lateral tibia actually looks a lot better The wounds on the right looks smaller lesions are mostly on the right ankle and dorsal foot On both sides we have much better edema control Electronic Signature(s) Signed: 05/03/2020 5:07:05 PM By: Linton Ham MD Entered By: Linton Ham on 05/03/2020 09:25:53 -------------------------------------------------------------------------------- Physician Orders Details Patient Name: Date of Service: [name] [name] F. 05/03/2020 8:30 A M Medical Record Number: [id number] Patient Account Number: [id number] Date of Birth/Sex: Treating RN: 1946-02-19 (75 y.o. [name], [name] Primary Care Provider: [name], [name] Other  May shower and wash wound with dial antibacterial soap and water prior to dressing change. Peri-Wound Care: Sween Lotion (Moisturizing lotion) 1 x Per Week/15 Days Discharge Instructions: Apply moisturizing lotion as directed Prim Dressing: KerraCel Ag Gelling Fiber Dressing, 4x5 in (silver alginate) 1 x Per Week/15 Days ary Discharge Instructions: Apply silver alginate to wound bed as instructed Secondary Dressing: Woven Gauze Sponge, Non-Sterile 4x4 in 1 x Per Week/15 Days Discharge Instructions: Apply over primary dressing as directed. Secondary Dressing: ABD Pad, 5x9 1 x Per Week/15 Days Discharge Instructions: Apply over primary dressing as  directed. Secured With: Coban Self-Adherent Wrap 4x5 (in/yd) 1 x Per Week/15 Days Discharge Instructions: Secure with Coban as directed. Secured With: [employer], 4.5x3.1 (in/yd) 1 x Per Week/15 Days Discharge Instructions: Secure with Kerlix as directed. Services and Therapies nkle Brachial Index (ABI) - with TBI's-bilateral A Electronic Signature(s) Signed: 05/03/2020 5:07:05 PM By: Linton Ham MD Signed: 05/03/2020 5:35:07 PM By: [name] Entered By: [name] on 05/03/2020 14:50:26 -------------------------------------------------------------------------------- Problem List Details Patient Name: Date of Service: [name] [name] F. 05/03/2020 8:30 A M Medical Record Number: [id number] Patient Account Number: [id number] Date of Birth/Sex: Treating RN: 30-Jan-1946 (75 y.o. [name], [name] Primary Care Provider: [name], [name] Other Clinician: Referring Provider: Treating Provider/Extender: Joeanne Robicheaux [name], [name] Weeks in Treatment: 211 Active Problems ICD-10 Encounter Code Description Active Date MDM Diagnosis E11.621 Type 2 diabetes mellitus with foot ulcer 04/17/2016 No Yes L97.221 Non-pressure chronic ulcer of left calf limited to breakdown of skin 08/15/2017 No Yes L97.321 Non-pressure chronic ulcer of left ankle limited to breakdown of skin 07/01/2018 No Yes L97.521 Non-pressure chronic ulcer of other part of left foot limited to breakdown of 02/17/2019 No Yes skin L97.211 Non-pressure chronic ulcer of right calf limited to breakdown of skin 07/21/2019 No Yes Inactive Problems ICD-10 Code Description Active Date Inactive Date L89.613 Pressure ulcer of right heel, stage 3 04/17/2016 04/17/2016 L03.032 Cellulitis of left toe 01/06/2019 01/06/2019 K02.542 Pressure ulcer of left heel, stage 3 12/04/2016 12/04/2016 I25.119 Atherosclerotic heart disease of native coronary artery with unspecified angina 04/17/2016  04/17/2016 pectoris L97.821 Non-pressure chronic ulcer of other part of left lower leg limited to breakdown of skin 01/06/2019 01/06/2019 S51.811D Laceration without foreign body of right forearm, subsequent encounter 10/22/2017 10/22/2017 H06.23 Acute diastolic (congestive) heart failure 04/17/2016 04/17/2016 L03.116 Cellulitis of left lower limb 12/24/2017 12/24/2017 L89.620 Pressure ulcer of left heel, unstageable 07/21/2019 07/21/2019 S80.211D Abrasion, right knee, subsequent encounter 08/18/2019 08/18/2019 Resolved Problems ICD-10 Code Description Active Date Resolved Date L89.512 Pressure ulcer of right ankle, stage 2 04/17/2016 04/17/2016 L89.522 Pressure ulcer of left ankle, stage 2 04/17/2016 04/17/2016 Electronic Signature(s) Signed: 05/03/2020 5:07:05 PM By: Linton Ham MD Entered By: Linton Ham on 05/03/2020 09:16:17 -------------------------------------------------------------------------------- Progress Note Details Patient Name: Date of Service: [name] [name] F. 05/03/2020 8:30 A M Medical Record Number: [id number] Patient Account Number: [id number] Date of Birth/Sex: Treating RN: May 09, 1945 (75 y.o. [name], [name] Primary Care Provider: [name], [name] Other Clinician: Referring Provider: Treating Provider/Extender: Loxley Cibrian [name], [name] Weeks in Treatment: 211 Subjective History of Present Illness (HPI) The following HPI elements were documented for the patient's wound: Location: On the left and right lateral forefoot which has been there for about 6 months Quality: Patient reports No Pain. Severity: Patient states wound(s) are getting worse. Duration: Patient has had the wound for > 6 months prior to seeking treatment at the wound center Context: The wound would happen gradually Modifying Factors:  May shower and wash wound with dial antibacterial soap and water prior to dressing change. Peri-Wound Care: Sween Lotion (Moisturizing lotion) 1 x Per Week/15 Days Discharge Instructions: Apply moisturizing lotion as directed Prim Dressing: KerraCel Ag Gelling Fiber Dressing, 4x5 in (silver alginate) 1 x Per Week/15 Days ary Discharge Instructions: Apply silver alginate to wound bed as instructed Secondary Dressing: Woven Gauze Sponge, Non-Sterile 4x4 in 1 x Per Week/15 Days Discharge Instructions: Apply over primary dressing as directed. Secondary Dressing: ABD Pad, 5x9 1 x Per Week/15 Days Discharge Instructions: Apply over primary dressing as  directed. Secured With: Coban Self-Adherent Wrap 4x5 (in/yd) 1 x Per Week/15 Days Discharge Instructions: Secure with Coban as directed. Secured With: [employer], 4.5x3.1 (in/yd) 1 x Per Week/15 Days Discharge Instructions: Secure with Kerlix as directed. Services and Therapies nkle Brachial Index (ABI) - with TBI's-bilateral A Electronic Signature(s) Signed: 05/03/2020 5:07:05 PM By: Linton Ham MD Signed: 05/03/2020 5:35:07 PM By: [name] Entered By: [name] on 05/03/2020 14:50:26 -------------------------------------------------------------------------------- Problem List Details Patient Name: Date of Service: [name] [name] F. 05/03/2020 8:30 A M Medical Record Number: [id number] Patient Account Number: [id number] Date of Birth/Sex: Treating RN: 30-Jan-1946 (75 y.o. [name], [name] Primary Care Provider: [name], [name] Other Clinician: Referring Provider: Treating Provider/Extender: Joeanne Robicheaux [name], [name] Weeks in Treatment: 211 Active Problems ICD-10 Encounter Code Description Active Date MDM Diagnosis E11.621 Type 2 diabetes mellitus with foot ulcer 04/17/2016 No Yes L97.221 Non-pressure chronic ulcer of left calf limited to breakdown of skin 08/15/2017 No Yes L97.321 Non-pressure chronic ulcer of left ankle limited to breakdown of skin 07/01/2018 No Yes L97.521 Non-pressure chronic ulcer of other part of left foot limited to breakdown of 02/17/2019 No Yes skin L97.211 Non-pressure chronic ulcer of right calf limited to breakdown of skin 07/21/2019 No Yes Inactive Problems ICD-10 Code Description Active Date Inactive Date L89.613 Pressure ulcer of right heel, stage 3 04/17/2016 04/17/2016 L03.032 Cellulitis of left toe 01/06/2019 01/06/2019 K02.542 Pressure ulcer of left heel, stage 3 12/04/2016 12/04/2016 I25.119 Atherosclerotic heart disease of native coronary artery with unspecified angina 04/17/2016  04/17/2016 pectoris L97.821 Non-pressure chronic ulcer of other part of left lower leg limited to breakdown of skin 01/06/2019 01/06/2019 S51.811D Laceration without foreign body of right forearm, subsequent encounter 10/22/2017 10/22/2017 H06.23 Acute diastolic (congestive) heart failure 04/17/2016 04/17/2016 L03.116 Cellulitis of left lower limb 12/24/2017 12/24/2017 L89.620 Pressure ulcer of left heel, unstageable 07/21/2019 07/21/2019 S80.211D Abrasion, right knee, subsequent encounter 08/18/2019 08/18/2019 Resolved Problems ICD-10 Code Description Active Date Resolved Date L89.512 Pressure ulcer of right ankle, stage 2 04/17/2016 04/17/2016 L89.522 Pressure ulcer of left ankle, stage 2 04/17/2016 04/17/2016 Electronic Signature(s) Signed: 05/03/2020 5:07:05 PM By: Linton Ham MD Entered By: Linton Ham on 05/03/2020 09:16:17 -------------------------------------------------------------------------------- Progress Note Details Patient Name: Date of Service: [name] [name] F. 05/03/2020 8:30 A M Medical Record Number: [id number] Patient Account Number: [id number] Date of Birth/Sex: Treating RN: May 09, 1945 (75 y.o. [name], [name] Primary Care Provider: [name], [name] Other Clinician: Referring Provider: Treating Provider/Extender: Loxley Cibrian [name], [name] Weeks in Treatment: 211 Subjective History of Present Illness (HPI) The following HPI elements were documented for the patient's wound: Location: On the left and right lateral forefoot which has been there for about 6 months Quality: Patient reports No Pain. Severity: Patient states wound(s) are getting worse. Duration: Patient has had the wound for > 6 months prior to seeking treatment at the wound center Context: The wound would happen gradually Modifying Factors:  May shower and wash wound with dial antibacterial soap and water prior to dressing change. Peri-Wound Care: Sween Lotion (Moisturizing lotion) 1 x Per Week/15 Days Discharge Instructions: Apply moisturizing lotion as directed Prim Dressing: KerraCel Ag Gelling Fiber Dressing, 4x5 in (silver alginate) 1 x Per Week/15 Days ary Discharge Instructions: Apply silver alginate to wound bed as instructed Secondary Dressing: Woven Gauze Sponge, Non-Sterile 4x4 in 1 x Per Week/15 Days Discharge Instructions: Apply over primary dressing as directed. Secondary Dressing: ABD Pad, 5x9 1 x Per Week/15 Days Discharge Instructions: Apply over primary dressing as  directed. Secured With: Coban Self-Adherent Wrap 4x5 (in/yd) 1 x Per Week/15 Days Discharge Instructions: Secure with Coban as directed. Secured With: [employer], 4.5x3.1 (in/yd) 1 x Per Week/15 Days Discharge Instructions: Secure with Kerlix as directed. Services and Therapies nkle Brachial Index (ABI) - with TBI's-bilateral A Electronic Signature(s) Signed: 05/03/2020 5:07:05 PM By: Linton Ham MD Signed: 05/03/2020 5:35:07 PM By: [name] Entered By: [name] on 05/03/2020 14:50:26 -------------------------------------------------------------------------------- Problem List Details Patient Name: Date of Service: [name] [name] F. 05/03/2020 8:30 A M Medical Record Number: [id number] Patient Account Number: [id number] Date of Birth/Sex: Treating RN: 30-Jan-1946 (75 y.o. [name], [name] Primary Care Provider: [name], [name] Other Clinician: Referring Provider: Treating Provider/Extender: Joeanne Robicheaux [name], [name] Weeks in Treatment: 211 Active Problems ICD-10 Encounter Code Description Active Date MDM Diagnosis E11.621 Type 2 diabetes mellitus with foot ulcer 04/17/2016 No Yes L97.221 Non-pressure chronic ulcer of left calf limited to breakdown of skin 08/15/2017 No Yes L97.321 Non-pressure chronic ulcer of left ankle limited to breakdown of skin 07/01/2018 No Yes L97.521 Non-pressure chronic ulcer of other part of left foot limited to breakdown of 02/17/2019 No Yes skin L97.211 Non-pressure chronic ulcer of right calf limited to breakdown of skin 07/21/2019 No Yes Inactive Problems ICD-10 Code Description Active Date Inactive Date L89.613 Pressure ulcer of right heel, stage 3 04/17/2016 04/17/2016 L03.032 Cellulitis of left toe 01/06/2019 01/06/2019 K02.542 Pressure ulcer of left heel, stage 3 12/04/2016 12/04/2016 I25.119 Atherosclerotic heart disease of native coronary artery with unspecified angina 04/17/2016  04/17/2016 pectoris L97.821 Non-pressure chronic ulcer of other part of left lower leg limited to breakdown of skin 01/06/2019 01/06/2019 S51.811D Laceration without foreign body of right forearm, subsequent encounter 10/22/2017 10/22/2017 H06.23 Acute diastolic (congestive) heart failure 04/17/2016 04/17/2016 L03.116 Cellulitis of left lower limb 12/24/2017 12/24/2017 L89.620 Pressure ulcer of left heel, unstageable 07/21/2019 07/21/2019 S80.211D Abrasion, right knee, subsequent encounter 08/18/2019 08/18/2019 Resolved Problems ICD-10 Code Description Active Date Resolved Date L89.512 Pressure ulcer of right ankle, stage 2 04/17/2016 04/17/2016 L89.522 Pressure ulcer of left ankle, stage 2 04/17/2016 04/17/2016 Electronic Signature(s) Signed: 05/03/2020 5:07:05 PM By: Linton Ham MD Entered By: Linton Ham on 05/03/2020 09:16:17 -------------------------------------------------------------------------------- Progress Note Details Patient Name: Date of Service: [name] [name] F. 05/03/2020 8:30 A M Medical Record Number: [id number] Patient Account Number: [id number] Date of Birth/Sex: Treating RN: May 09, 1945 (75 y.o. [name], [name] Primary Care Provider: [name], [name] Other Clinician: Referring Provider: Treating Provider/Extender: Loxley Cibrian [name], [name] Weeks in Treatment: 211 Subjective History of Present Illness (HPI) The following HPI elements were documented for the patient's wound: Location: On the left and right lateral forefoot which has been there for about 6 months Quality: Patient reports No Pain. Severity: Patient states wound(s) are getting worse. Duration: Patient has had the wound for > 6 months prior to seeking treatment at the wound center Context: The wound would happen gradually Modifying Factors:  healed. There is callus here however no open wounds. The substantial area on his left upper lateral tibia which has been so refractory looks a lot better. He still has multiple small open areas on the dorsal left foot and ankle. One of these at the crease of the foot and ankle is actually quite deep and has debris in the center. On the right leg he only has one small open area on the right lateral leg this is just about closed We apparently had the order of silver alginate they come in with polymen Ag. However given the fact that things look so much better I am going to continue with the latter 04/26/2020; patient returns to the clinic after about a 9 to 10-week hiatus. I am not exactly sure why. The patient says he had a cold and was sick. He was apparently tested for Covid but was  negative. He literally dropped off our schedule. He arrives back in clinic with a multitude of wounds especially on the left dorsal foot and lower leg. There are also areas on the right dorsal foot and the right lower leg. Fortunately the pressure ulcers on the bilateral lateral heels of once again closed. Again the history here is vague. Apparently everything is deteriorated on the left greater than right over the last 2 to 3 weeks. His daughter wondered whether they were not wrapping this properly they have been applying topical antibiotic and a kerlix Coban wrap. He absolutely denies any pain 1/11; I put him under our kerlix, Coban wrap and the edema control is a lot better. They have not been changing this at home. There has been some improvement in his wounds except on the left dorsal foot and ankle. In fact he has a new wound on the left dorsal foot. The last time he was here I reviewed his past arterial studies and I am going to repeat them. Through this timeframe the wounds on his bilateral heels, pressure ulcers have actually managed to heal. Electronic Signature(s) Signed: 05/03/2020 5:07:05 PM By: Linton Ham MD Entered By: Linton Ham on 05/03/2020 09:22:26 -------------------------------------------------------------------------------- Physical Exam Details Patient Name: Date of Service: [name] [name] F. 05/03/2020 8:30 A M Medical Record Number: [id number] Patient Account Number: [id number] Date of Birth/Sex: Treating RN: 05-Apr-1946 (75 y.o. [name] Primary Care Provider: [name], [name] Other Clinician: Referring Provider: Treating Provider/Extender: Rodnesha Elie [name], [name] Weeks in Treatment: 211 Constitutional Sitting or standing Blood Pressure is within target range for patient.. Pulse regular and within target range for patient.Marland Kitchen Respirations regular, non-labored and within target range.. Temperature is normal and within the target range for the  patient.Marland Kitchen Appears in no distress. Notes Wound exam; the areas on the dorsal foot continue to deteriorate. He has a new one distally the proximal 1 is necrotic removal of this reveals exposed tendon. I debrided both of these areas with a #5 curette hemostasis with direct pressure. The area on the left lateral tibia actually looks a lot better The wounds on the right looks smaller lesions are mostly on the right ankle and dorsal foot On both sides we have much better edema control Electronic Signature(s) Signed: 05/03/2020 5:07:05 PM By: Linton Ham MD Entered By: Linton Ham on 05/03/2020 09:25:53 -------------------------------------------------------------------------------- Physician Orders Details Patient Name: Date of Service: [name] [name] F. 05/03/2020 8:30 A M Medical Record Number: [id number] Patient Account Number: [id number] Date of Birth/Sex: Treating RN: 1946-02-19 (75 y.o. [name], [name] Primary Care Provider: [name], [name] Other  DEADRICK, STIDD ([id number]) Visit Report for 05/03/2020 Debridement Details Patient Name: Date of Service: [name] [name] [facility] F. 05/03/2020 8:30 A M Medical Record Number: [id number] Patient Account Number: [id number] Date of Birth/Sex: Treating RN: [birth year] (75 y.o. [name], [name] Primary Care Provider: [name], [name] Other Clinician: Referring Provider: Treating Provider/Extender: Scarlettrose Costilow [name], [name] Weeks in Treatment: 211 Debridement Performed for Assessment: Wound #32 Left,Distal,Dorsal Foot Performed By: Physician [name], MD Debridement Type: Debridement Severity of Tissue Pre Debridement: Fat layer exposed Level of Consciousness (Pre-procedure): Awake and Alert Pre-procedure Verification/Time Out Yes - 09:13 Taken: Start Time: 09:13 Pain Control: Lidocaine T Area Debrided (L x W): otal 1.3 (cm) x 1.7 (cm) = 2.21 (cm) Tissue and other material debrided: Viable, Non-Viable, Slough, Subcutaneous, Slough Level: Skin/Subcutaneous Tissue Debridement Description: Excisional Instrument: Curette Bleeding: Minimum Hemostasis Achieved: Pressure End Time: 09:14 Procedural Pain: 0 Post Procedural Pain: 0 Response to Treatment: Procedure was tolerated well Level of Consciousness (Post- Awake and Alert procedure): Post Debridement Measurements of Total Wound Length: (cm) 1.3 Width: (cm) 1.7 Depth: (cm) 0.1 Volume: (cm) 0.174 Character of Wound/Ulcer Post Debridement: Improved Severity of Tissue Post Debridement: Fat layer exposed Post Procedure Diagnosis Same as Pre-procedure Electronic Signature(s) Signed: 05/03/2020 5:07:05 PM By: Linton Ham MD Signed: 05/03/2020 5:35:07 PM By: [name] Entered By: [name] on 05/03/2020 09:14:33 -------------------------------------------------------------------------------- Debridement Details Patient Name: Date of Service: [name] [name] F. 05/03/2020 8:30 A M Medical Record  Number: [id number] Patient Account Number: [id number] Date of Birth/Sex: Treating RN: Jan 06, 1946 (75 y.o. [name], [name] Primary Care Provider: [name], [name] Other Clinician: Referring Provider: Treating Provider/Extender: Kaitland Lewellyn [name], [name] Weeks in Treatment: 211 Debridement Performed for Assessment: Wound #26 Left,Dorsal Foot Performed By: Physician [name], MD Debridement Type: Debridement Severity of Tissue Pre Debridement: Fat layer exposed Level of Consciousness (Pre-procedure): Awake and Alert Pre-procedure Verification/Time Out Yes - 09:13 Taken: Start Time: 09:13 Pain Control: Lidocaine T Area Debrided (L x W): otal 4.2 (cm) x 3.5 (cm) = 14.7 (cm) Tissue and other material debrided: Viable, Non-Viable, Slough, Subcutaneous, Slough Level: Skin/Subcutaneous Tissue Debridement Description: Excisional Instrument: Curette Bleeding: Minimum Hemostasis Achieved: Pressure End Time: 09:14 Procedural Pain: 0 Post Procedural Pain: 0 Response to Treatment: Procedure was tolerated well Level of Consciousness (Post- Awake and Alert procedure): Post Debridement Measurements of Total Wound Length: (cm) 4.2 Width: (cm) 3.5 Depth: (cm) 0.1 Volume: (cm) 1.155 Character of Wound/Ulcer Post Debridement: Improved Severity of Tissue Post Debridement: Fat layer exposed Post Procedure Diagnosis Same as Pre-procedure Electronic Signature(s) Signed: 05/03/2020 5:07:05 PM By: Linton Ham MD Signed: 05/03/2020 5:35:07 PM By: [name] Entered By: Linton Ham on 05/03/2020 09:19:41 -------------------------------------------------------------------------------- HPI Details Patient Name: Date of Service: [name] [name] F. 05/03/2020 8:30 A M Medical Record Number: [id number] Patient Account Number: [id number] Date of Birth/Sex: Treating RN: [birth year] (75 y.o. [name] Primary Care Provider: [name], [name] Other  Clinician: Referring Provider: Treating Provider/Extender: Morayo Leven [name], [name] Weeks in Treatment: 211 History of Present Illness Location: On the left and right lateral forefoot which has been there for about 6 months Quality: Patient reports No Pain. Severity: Patient states wound(s) are getting worse. Duration: Patient has had the wound for > 6 months prior to seeking treatment at the wound center Context: The wound would happen gradually Modifying Factors: Patient wound(s)/ulcer(s) are worsening due to :continual drainage from the wound ssociated Signs and Symptoms: Patient reports having increase discharge. A HPI Description: This  healed. There is callus here however no open wounds. The substantial area on his left upper lateral tibia which has been so refractory looks a lot better. He still has multiple small open areas on the dorsal left foot and ankle. One of these at the crease of the foot and ankle is actually quite deep and has debris in the center. On the right leg he only has one small open area on the right lateral leg this is just about closed We apparently had the order of silver alginate they come in with polymen Ag. However given the fact that things look so much better I am going to continue with the latter 04/26/2020; patient returns to the clinic after about a 9 to 10-week hiatus. I am not exactly sure why. The patient says he had a cold and was sick. He was apparently tested for Covid but was  negative. He literally dropped off our schedule. He arrives back in clinic with a multitude of wounds especially on the left dorsal foot and lower leg. There are also areas on the right dorsal foot and the right lower leg. Fortunately the pressure ulcers on the bilateral lateral heels of once again closed. Again the history here is vague. Apparently everything is deteriorated on the left greater than right over the last 2 to 3 weeks. His daughter wondered whether they were not wrapping this properly they have been applying topical antibiotic and a kerlix Coban wrap. He absolutely denies any pain 1/11; I put him under our kerlix, Coban wrap and the edema control is a lot better. They have not been changing this at home. There has been some improvement in his wounds except on the left dorsal foot and ankle. In fact he has a new wound on the left dorsal foot. The last time he was here I reviewed his past arterial studies and I am going to repeat them. Through this timeframe the wounds on his bilateral heels, pressure ulcers have actually managed to heal. Electronic Signature(s) Signed: 05/03/2020 5:07:05 PM By: Linton Ham MD Entered By: Linton Ham on 05/03/2020 09:22:26 -------------------------------------------------------------------------------- Physical Exam Details Patient Name: Date of Service: [name] [name] F. 05/03/2020 8:30 A M Medical Record Number: [id number] Patient Account Number: [id number] Date of Birth/Sex: Treating RN: 05-Apr-1946 (75 y.o. [name] Primary Care Provider: [name], [name] Other Clinician: Referring Provider: Treating Provider/Extender: Rodnesha Elie [name], [name] Weeks in Treatment: 211 Constitutional Sitting or standing Blood Pressure is within target range for patient.. Pulse regular and within target range for patient.Marland Kitchen Respirations regular, non-labored and within target range.. Temperature is normal and within the target range for the  patient.Marland Kitchen Appears in no distress. Notes Wound exam; the areas on the dorsal foot continue to deteriorate. He has a new one distally the proximal 1 is necrotic removal of this reveals exposed tendon. I debrided both of these areas with a #5 curette hemostasis with direct pressure. The area on the left lateral tibia actually looks a lot better The wounds on the right looks smaller lesions are mostly on the right ankle and dorsal foot On both sides we have much better edema control Electronic Signature(s) Signed: 05/03/2020 5:07:05 PM By: Linton Ham MD Entered By: Linton Ham on 05/03/2020 09:25:53 -------------------------------------------------------------------------------- Physician Orders Details Patient Name: Date of Service: [name] [name] F. 05/03/2020 8:30 A M Medical Record Number: [id number] Patient Account Number: [id number] Date of Birth/Sex: Treating RN: 1946-02-19 (75 y.o. [name], [name] Primary Care Provider: [name], [name] Other  healed. There is callus here however no open wounds. The substantial area on his left upper lateral tibia which has been so refractory looks a lot better. He still has multiple small open areas on the dorsal left foot and ankle. One of these at the crease of the foot and ankle is actually quite deep and has debris in the center. On the right leg he only has one small open area on the right lateral leg this is just about closed We apparently had the order of silver alginate they come in with polymen Ag. However given the fact that things look so much better I am going to continue with the latter 04/26/2020; patient returns to the clinic after about a 9 to 10-week hiatus. I am not exactly sure why. The patient says he had a cold and was sick. He was apparently tested for Covid but was  negative. He literally dropped off our schedule. He arrives back in clinic with a multitude of wounds especially on the left dorsal foot and lower leg. There are also areas on the right dorsal foot and the right lower leg. Fortunately the pressure ulcers on the bilateral lateral heels of once again closed. Again the history here is vague. Apparently everything is deteriorated on the left greater than right over the last 2 to 3 weeks. His daughter wondered whether they were not wrapping this properly they have been applying topical antibiotic and a kerlix Coban wrap. He absolutely denies any pain 1/11; I put him under our kerlix, Coban wrap and the edema control is a lot better. They have not been changing this at home. There has been some improvement in his wounds except on the left dorsal foot and ankle. In fact he has a new wound on the left dorsal foot. The last time he was here I reviewed his past arterial studies and I am going to repeat them. Through this timeframe the wounds on his bilateral heels, pressure ulcers have actually managed to heal. Electronic Signature(s) Signed: 05/03/2020 5:07:05 PM By: Linton Ham MD Entered By: Linton Ham on 05/03/2020 09:22:26 -------------------------------------------------------------------------------- Physical Exam Details Patient Name: Date of Service: [name] [name] F. 05/03/2020 8:30 A M Medical Record Number: [id number] Patient Account Number: [id number] Date of Birth/Sex: Treating RN: 05-Apr-1946 (75 y.o. [name] Primary Care Provider: [name], [name] Other Clinician: Referring Provider: Treating Provider/Extender: Rodnesha Elie [name], [name] Weeks in Treatment: 211 Constitutional Sitting or standing Blood Pressure is within target range for patient.. Pulse regular and within target range for patient.Marland Kitchen Respirations regular, non-labored and within target range.. Temperature is normal and within the target range for the  patient.Marland Kitchen Appears in no distress. Notes Wound exam; the areas on the dorsal foot continue to deteriorate. He has a new one distally the proximal 1 is necrotic removal of this reveals exposed tendon. I debrided both of these areas with a #5 curette hemostasis with direct pressure. The area on the left lateral tibia actually looks a lot better The wounds on the right looks smaller lesions are mostly on the right ankle and dorsal foot On both sides we have much better edema control Electronic Signature(s) Signed: 05/03/2020 5:07:05 PM By: Linton Ham MD Entered By: Linton Ham on 05/03/2020 09:25:53 -------------------------------------------------------------------------------- Physician Orders Details Patient Name: Date of Service: [name] [name] F. 05/03/2020 8:30 A M Medical Record Number: [id number] Patient Account Number: [id number] Date of Birth/Sex: Treating RN: 1946-02-19 (75 y.o. [name], [name] Primary Care Provider: [name], [name] Other

## 2020-05-04 ENCOUNTER — Other Ambulatory Visit: Payer: Self-pay | Admitting: Family Medicine

## 2020-05-04 DIAGNOSIS — N390 Urinary tract infection, site not specified: Secondary | ICD-10-CM | POA: Diagnosis not present

## 2020-05-04 DIAGNOSIS — R319 Hematuria, unspecified: Secondary | ICD-10-CM | POA: Diagnosis not present

## 2020-05-04 DIAGNOSIS — N312 Flaccid neuropathic bladder, not elsewhere classified: Secondary | ICD-10-CM | POA: Diagnosis not present

## 2020-05-09 ENCOUNTER — Encounter (HOSPITAL_COMMUNITY): Payer: Medicare HMO

## 2020-05-10 ENCOUNTER — Encounter (HOSPITAL_BASED_OUTPATIENT_CLINIC_OR_DEPARTMENT_OTHER): Payer: Medicare HMO | Admitting: Internal Medicine

## 2020-05-17 ENCOUNTER — Other Ambulatory Visit (HOSPITAL_COMMUNITY): Payer: Self-pay | Admitting: Internal Medicine

## 2020-05-17 ENCOUNTER — Telehealth: Payer: Medicare HMO

## 2020-05-17 ENCOUNTER — Encounter (HOSPITAL_BASED_OUTPATIENT_CLINIC_OR_DEPARTMENT_OTHER): Payer: Medicare HMO | Admitting: Internal Medicine

## 2020-05-17 ENCOUNTER — Other Ambulatory Visit: Payer: Self-pay

## 2020-05-17 ENCOUNTER — Ambulatory Visit (HOSPITAL_COMMUNITY)
Admission: RE | Admit: 2020-05-17 | Discharge: 2020-05-17 | Disposition: A | Discharge status: Home / Self Care | Payer: Medicare HMO | Source: Ambulatory Visit | Attending: Internal Medicine | Admitting: Internal Medicine

## 2020-05-17 DIAGNOSIS — L97521 Non-pressure chronic ulcer of other part of left foot limited to breakdown of skin: Secondary | ICD-10-CM | POA: Diagnosis not present

## 2020-05-17 DIAGNOSIS — I739 Peripheral vascular disease, unspecified: Secondary | ICD-10-CM

## 2020-05-17 DIAGNOSIS — L89613 Pressure ulcer of right heel, stage 3: Secondary | ICD-10-CM | POA: Diagnosis not present

## 2020-05-17 DIAGNOSIS — L97822 Non-pressure chronic ulcer of other part of left lower leg with fat layer exposed: Secondary | ICD-10-CM | POA: Diagnosis not present

## 2020-05-17 DIAGNOSIS — E1122 Type 2 diabetes mellitus with diabetic chronic kidney disease: Secondary | ICD-10-CM | POA: Diagnosis not present

## 2020-05-17 DIAGNOSIS — L97812 Non-pressure chronic ulcer of other part of right lower leg with fat layer exposed: Secondary | ICD-10-CM | POA: Diagnosis not present

## 2020-05-17 DIAGNOSIS — L97522 Non-pressure chronic ulcer of other part of left foot with fat layer exposed: Secondary | ICD-10-CM | POA: Diagnosis not present

## 2020-05-17 DIAGNOSIS — L97512 Non-pressure chronic ulcer of other part of right foot with fat layer exposed: Secondary | ICD-10-CM | POA: Diagnosis not present

## 2020-05-17 DIAGNOSIS — L97412 Non-pressure chronic ulcer of right heel and midfoot with fat layer exposed: Secondary | ICD-10-CM | POA: Diagnosis not present

## 2020-05-17 DIAGNOSIS — E11621 Type 2 diabetes mellitus with foot ulcer: Secondary | ICD-10-CM | POA: Diagnosis not present

## 2020-05-17 DIAGNOSIS — E11622 Type 2 diabetes mellitus with other skin ulcer: Secondary | ICD-10-CM | POA: Diagnosis not present

## 2020-05-18 NOTE — Progress Notes (Signed)
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 L97.521 Non-pressure chronic ulcer of other part of left foot limited to breakdown of skin L97.211 Non-pressure chronic ulcer of right calf limited to breakdown of skin Facility Procedures CPT4 Code: [id number] Description: [phone] - WOUND CARE VISIT-LEV 5 EST PT Modifier: Quantity: 1 Physician Procedures : CPT4 Code Description Modifier [id number] - WC PHYS LEVEL 3 - EST PT ICD-10  Diagnosis Description E11.621 Type 2 diabetes mellitus with foot ulcer L89.613 Pressure ulcer of right heel, stage 3 L97.521 Non-pressure chronic ulcer of other part of left  foot limited to breakdown of skin L97.221 Non-pressure chronic ulcer of left calf limited to breakdown of skin Quantity: 1 Electronic Signature(s) Signed: 05/17/2020 5:14:25 PM By: Linton Ham MD Entered By: Linton Ham on 05/17/2020 10:07:12  x Per Week/15 Days Discharge Instructions: Secure with Coban as directed. Secured With: [employer], 4.5x3.1 (in/yd) 1 x Per Week/15 Days Discharge Instructions: Secure with Kerlix as directed. Wound #31 - Knee Wound Laterality: Right Cleanser: Soap and Water 1 x Per Week/15 Days Discharge Instructions: May shower and wash wound with dial antibacterial soap and water prior to dressing change. Peri-Wound Care: Sween Lotion (Moisturizing lotion) 1 x Per Week/15 Days Discharge Instructions: Apply moisturizing lotion as directed Prim Dressing: KerraCel Ag Gelling Fiber Dressing, 4x5 in (silver alginate) 1 x Per Week/15 Days ary Discharge Instructions: Apply silver alginate to wound bed as instructed Secondary Dressing: Woven Gauze Sponge, Non-Sterile 4x4 in 1 x Per Week/15 Days Discharge Instructions: Apply over primary dressing as directed. Secondary Dressing: ABD Pad, 5x9 1 x Per Week/15 Days Discharge Instructions: Apply over primary dressing as directed. Secured With: Coban Self-Adherent Wrap 4x5 (in/yd) 1 x Per Week/15 Days Discharge Instructions: Secure with Coban as directed. Secured With: [employer], 4.5x3.1 (in/yd) 1 x Per Week/15 Days Discharge Instructions: Secure with Kerlix as directed. Wound #32 - Foot Wound Laterality: Dorsal, Left, Distal Cleanser: Soap and Water 1 x Per Week/15 Days Discharge Instructions: May shower and wash wound with dial antibacterial soap and  water prior to dressing change. Peri-Wound Care: Sween Lotion (Moisturizing lotion) 1 x Per Week/15 Days Discharge Instructions: Apply moisturizing lotion as directed Prim Dressing: KerraCel Ag Gelling Fiber Dressing, 4x5 in (silver alginate) 1 x Per Week/15 Days ary Discharge Instructions: Apply silver alginate to wound bed as instructed Secondary Dressing: Woven Gauze Sponge, Non-Sterile 4x4 in 1 x Per Week/15 Days Discharge Instructions: Apply over primary dressing as directed. Secondary Dressing: ABD Pad, 5x9 1 x Per Week/15 Days Discharge Instructions: Apply over primary dressing as directed. Secured With: Coban Self-Adherent Wrap 4x5 (in/yd) 1 x Per Week/15 Days Discharge Instructions: Secure with Coban as directed. Secured With: [employer], 4.5x3.1 (in/yd) 1 x Per Week/15 Days Discharge Instructions: Secure with Kerlix as directed. Consults Vascular - Pt. needs consultation with Vein and Vascular MD r/t ABI's and TBI's studies and multiple non-healing ulcers on legs. Notes FOR TODAY ONLY: ONLY WRAP PT WITH KERLIX SO THAT HE CAN GO TO VASCULAR APPT TO GET ABI'S AND TBI'S DONE. DAUGHTER WILL . Marland Kitchen DRESS PT AFTER THEIR APPT . Marland Kitchen Electronic Signature(s) Signed: 05/17/2020 5:14:25 PM By: Linton Ham MD Signed: 05/18/2020 5:56:51 PM By: [name] Entered By: [name] on 05/17/2020 17:07:45 -------------------------------------------------------------------------------- Problem List Details Patient Name: Date of Service: [name] [name] F. 05/17/2020 8:45 A M Medical Record Number: [id number] Patient Account Number: [id number] Date of Birth/Sex: Treating RN: 1945/09/11 (75 y.o. 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 Secured With: [employer], 4.5x3.1 (in/yd) 1 x Per Week/15 Days Discharge Instructions: Secure with Kerlix as directed. Wound #31 - Knee Wound Laterality: Right Cleanser: Soap and Water 1 x Per Week/15 Days Discharge Instructions: May shower and wash wound with dial antibacterial soap and water prior to dressing change. Peri-Wound Care: Sween Lotion (Moisturizing lotion) 1 x Per Week/15 Days Discharge Instructions: Apply moisturizing lotion as directed Prim Dressing: KerraCel Ag Gelling Fiber Dressing, 4x5 in (silver alginate) 1 x Per Week/15 Days ary Discharge Instructions: Apply silver alginate to wound bed as instructed Secondary Dressing: Woven Gauze Sponge, Non-Sterile 4x4 in 1 x Per Week/15 Days Discharge Instructions: Apply over primary dressing as directed. Secondary Dressing: ABD Pad, 5x9 1 x Per Week/15 Days Discharge Instructions: Apply over primary dressing as directed. Secured With: Coban Self-Adherent Wrap 4x5 (in/yd) 1 x Per Week/15 Days Discharge Instructions: Secure with Coban as directed. Secured With: [employer], 4.5x3.1 (in/yd) 1 x Per Week/15 Days Discharge Instructions: Secure with Kerlix as directed. Wound #32 - Foot Wound Laterality: Dorsal, Left, Distal Cleanser: Soap and Water 1 x Per Week/15 Days Discharge Instructions: May shower and wash wound with dial antibacterial soap and  water prior to dressing change. Peri-Wound Care: Sween Lotion (Moisturizing lotion) 1 x Per Week/15 Days Discharge Instructions: Apply moisturizing lotion as directed Prim Dressing: KerraCel Ag Gelling Fiber Dressing, 4x5 in (silver alginate) 1 x Per Week/15 Days ary Discharge Instructions: Apply silver alginate to wound bed as instructed Secondary Dressing: Woven Gauze Sponge, Non-Sterile 4x4 in 1 x Per Week/15 Days Discharge Instructions: Apply over primary dressing as directed. Secondary Dressing: ABD Pad, 5x9 1 x Per Week/15 Days Discharge Instructions: Apply over primary dressing as directed. Secured With: [employer] Self-Adherent Wrap 4x5 (in/yd) 1 x Per Week/15 Days Discharge Instructions: Secure with Coban as directed. Secured With: [employer], 4.5x3.1 (in/yd) 1 x Per Week/15 Days Discharge Instructions: Secure with Kerlix as directed. Consults Vascular - Pt. needs consultation with Vein and Vascular MD r/t ABI's and TBI's studies and multiple non-healing ulcers on legs. Notes FOR TODAY ONLY: ONLY WRAP PT WITH KERLIX SO THAT HE CAN GO TO VASCULAR APPT TO GET ABI'S AND TBI'S DONE. DAUGHTER WILL . Marland Kitchen DRESS PT AFTER THEIR APPT . Marland Kitchen Electronic Signature(s) Signed: 05/17/2020 5:14:25 PM By: Linton Ham MD Signed: 05/18/2020 5:56:51 PM By: [name] Entered By: [name] on 05/17/2020 17:07:45 -------------------------------------------------------------------------------- Problem List Details Patient Name: Date of Service: [name] [name] F. 05/17/2020 8:45 A M Medical Record Number: [id number] Patient Account Number: [id number] Date of Birth/Sex: Treating RN: 1945/09/11 (75 y.o. [name], [name] Primary Care Provider: [name], [name] Other Clinician: Referring Provider: Treating Provider/Extender: Irmalee Riemenschneider [name], [name] Weeks in Treatment: 213 Active Problems ICD-10 Encounter Code Description Active Date MDM Diagnosis E11.621 Type 2  diabetes mellitus with foot ulcer 04/17/2016 No Yes L97.221 Non-pressure chronic ulcer of left calf limited to breakdown of skin 08/15/2017 No Yes L97.321 Non-pressure chronic ulcer of left ankle limited to breakdown of skin 07/01/2018 No Yes L97.521 Non-pressure chronic ulcer of other part of left foot limited to breakdown of 02/17/2019 No Yes skin L97.211 Non-pressure chronic ulcer of right calf limited to breakdown of skin 07/21/2019 No Yes L89.613 Pressure ulcer of right heel, stage 3 04/17/2016 No Yes L97.518 Non-pressure chronic ulcer of other part of right foot with other specified 05/17/2020 No Yes severity Inactive Problems ICD-10 Code Description Active Date  moisturizing lotion as directed Prim Dressing: KerraCel Ag Gelling Fiber Dressing, 4x5 in (silver alginate) 1 x Per Week/15 Days ary Discharge Instructions: Apply silver alginate to wound bed as instructed Secondary Dressing: Woven Gauze Sponge, Non-Sterile 4x4 in 1 x Per Week/15 Days Discharge Instructions: Apply over primary dressing as directed. Secondary Dressing: ABD Pad, 5x9 1 x Per Week/15 Days Discharge Instructions: Apply over primary dressing as directed. Secured With: Coban Self-Adherent Wrap 4x5 (in/yd) 1 x Per Week/15 Days Discharge Instructions: Secure with Coban as directed. Secured With: [employer], 4.5x3.1 (in/yd) 1 x Per Week/15 Days Discharge Instructions: Secure with Kerlix as directed. 1. The patient has his arterial studies at 11:00 this morning. We cannot really wrap him will need to bring him back or perhaps they have enough dressing material at home 2. Silver collagen to the areas on  the left with tendon exposure silver alginate elsewhere. I am 3. I am at a real loss with this gentleman. He continues to have new wounds new skin deterioration almost every time he is here and I do not really have a great explanation for it. Initially he came in with bilateral large heel pressure ulcers these healed however since then we have been battling areas on his dorsal feet the left lateral leg and I am not sure I have a wonderful explanation for these. He had arterial studies in June 2020 monophasic waveforms but great toe pressures Electronic Signature(s) Signed: 05/17/2020 5:14:25 PM By: Linton Ham MD Entered By: Linton Ham on 05/17/2020 10:06:37 -------------------------------------------------------------------------------- SuperBill Details Patient Name: Date of Service: [name] [name] F. 05/17/2020 Medical Record Number: [id number] Patient Account Number: [id number] Date of Birth/Sex: Treating RN: [birth year] (75 y.o. 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 L97.521 Non-pressure chronic ulcer of other part of left foot limited to breakdown of skin L97.211 Non-pressure chronic ulcer of right calf limited to breakdown of skin Facility Procedures CPT4 Code: [id number] Description: [phone] - WOUND CARE VISIT-LEV 5 EST PT Modifier: Quantity: 1 Physician Procedures : CPT4 Code Description Modifier [id number] - WC PHYS LEVEL 3 - EST PT ICD-10  Diagnosis Description E11.621 Type 2 diabetes mellitus with foot ulcer L89.613 Pressure ulcer of right heel, stage 3 L97.521 Non-pressure chronic ulcer of other part of left  foot limited to breakdown of skin L97.221 Non-pressure chronic ulcer of left calf limited to breakdown of skin Quantity: 1 Electronic Signature(s) Signed: 05/17/2020 5:14:25 PM By: Linton Ham MD Entered By: Linton Ham on 05/17/2020 10:07:12  x Per Week/15 Days Discharge Instructions: Secure with Coban as directed. Secured With: [employer], 4.5x3.1 (in/yd) 1 x Per Week/15 Days Discharge Instructions: Secure with Kerlix as directed. Wound #31 - Knee Wound Laterality: Right Cleanser: Soap and Water 1 x Per Week/15 Days Discharge Instructions: May shower and wash wound with dial antibacterial soap and water prior to dressing change. Peri-Wound Care: Sween Lotion (Moisturizing lotion) 1 x Per Week/15 Days Discharge Instructions: Apply moisturizing lotion as directed Prim Dressing: KerraCel Ag Gelling Fiber Dressing, 4x5 in (silver alginate) 1 x Per Week/15 Days ary Discharge Instructions: Apply silver alginate to wound bed as instructed Secondary Dressing: Woven Gauze Sponge, Non-Sterile 4x4 in 1 x Per Week/15 Days Discharge Instructions: Apply over primary dressing as directed. Secondary Dressing: ABD Pad, 5x9 1 x Per Week/15 Days Discharge Instructions: Apply over primary dressing as directed. Secured With: [employer]an Self-Adherent Wrap 4x5 (in/yd) 1 x Per Week/15 Days Discharge Instructions: Secure with Coban as directed. Secured With: [employer], 4.5x3.1 (in/yd) 1 x Per Week/15 Days Discharge Instructions: Secure with Kerlix as directed. Wound #32 - Foot Wound Laterality: Dorsal, Left, Distal Cleanser: Soap and Water 1 x Per Week/15 Days Discharge Instructions: May shower and wash wound with dial antibacterial soap and  water prior to dressing change. Peri-Wound Care: Sween Lotion (Moisturizing lotion) 1 x Per Week/15 Days Discharge Instructions: Apply moisturizing lotion as directed Prim Dressing: KerraCel Ag Gelling Fiber Dressing, 4x5 in (silver alginate) 1 x Per Week/15 Days ary Discharge Instructions: Apply silver alginate to wound bed as instructed Secondary Dressing: Woven Gauze Sponge, Non-Sterile 4x4 in 1 x Per Week/15 Days Discharge Instructions: Apply over primary dressing as directed. Secondary Dressing: ABD Pad, 5x9 1 x Per Week/15 Days Discharge Instructions: Apply over primary dressing as directed. Secured With: Coban Self-Adherent Wrap 4x5 (in/yd) 1 x Per Week/15 Days Discharge Instructions: Secure with Coban as directed. Secured With: [employer], 4.5x3.1 (in/yd) 1 x Per Week/15 Days Discharge Instructions: Secure with Kerlix as directed. Consults Vascular - Pt. needs consultation with Vein and Vascular MD r/t ABI's and TBI's studies and multiple non-healing ulcers on legs. Notes FOR TODAY ONLY: ONLY WRAP PT WITH KERLIX SO THAT HE CAN GO TO VASCULAR APPT TO GET ABI'S AND TBI'S DONE. DAUGHTER WILL . Marland Kitchen DRESS PT AFTER THEIR APPT . Marland Kitchen Electronic Signature(s) Signed: 05/17/2020 5:14:25 PM By: Linton Ham MD Signed: 05/18/2020 5:56:51 PM By: [name] Entered By: [name] on 05/17/2020 17:07:45 -------------------------------------------------------------------------------- Problem List Details Patient Name: Date of Service: [name] [name] F. 05/17/2020 8:45 A M Medical Record Number: [id number] Patient Account Number: [id number] Date of Birth/Sex: Treating RN: 1945/09/11 (75 y.o. [name], [name] Primary Care Provider: [name], [name] Other Clinician: Referring Provider: Treating Provider/Extender: Irmalee Riemenschneider [name], [name] Weeks in Treatment: 213 Active Problems ICD-10 Encounter Code Description Active Date MDM Diagnosis E11.621 Type 2  diabetes mellitus with foot ulcer 04/17/2016 No Yes L97.221 Non-pressure chronic ulcer of left calf limited to breakdown of skin 08/15/2017 No Yes L97.321 Non-pressure chronic ulcer of left ankle limited to breakdown of skin 07/01/2018 No Yes L97.521 Non-pressure chronic ulcer of other part of left foot limited to breakdown of 02/17/2019 No Yes skin L97.211 Non-pressure chronic ulcer of right calf limited to breakdown of skin 07/21/2019 No Yes L89.613 Pressure ulcer of right heel, stage 3 04/17/2016 No Yes L97.518 Non-pressure chronic ulcer of other part of right foot with other specified 05/17/2020 No Yes severity Inactive Problems ICD-10 Code Description Active Date  moisturizing lotion as directed Prim Dressing: KerraCel Ag Gelling Fiber Dressing, 4x5 in (silver alginate) 1 x Per Week/15 Days ary Discharge Instructions: Apply silver alginate to wound bed as instructed Secondary Dressing: Woven Gauze Sponge, Non-Sterile 4x4 in 1 x Per Week/15 Days Discharge Instructions: Apply over primary dressing as directed. Secondary Dressing: ABD Pad, 5x9 1 x Per Week/15 Days Discharge Instructions: Apply over primary dressing as directed. Secured With: Coban Self-Adherent Wrap 4x5 (in/yd) 1 x Per Week/15 Days Discharge Instructions: Secure with Coban as directed. Secured With: [employer], 4.5x3.1 (in/yd) 1 x Per Week/15 Days Discharge Instructions: Secure with Kerlix as directed. 1. The patient has his arterial studies at 11:00 this morning. We cannot really wrap him will need to bring him back or perhaps they have enough dressing material at home 2. Silver collagen to the areas on  the left with tendon exposure silver alginate elsewhere. I am 3. I am at a real loss with this gentleman. He continues to have new wounds new skin deterioration almost every time he is here and I do not really have a great explanation for it. Initially he came in with bilateral large heel pressure ulcers these healed however since then we have been battling areas on his dorsal feet the left lateral leg and I am not sure I have a wonderful explanation for these. He had arterial studies in June 2020 monophasic waveforms but great toe pressures Electronic Signature(s) Signed: 05/17/2020 5:14:25 PM By: Linton Ham MD Entered By: Linton Ham on 05/17/2020 10:06:37 -------------------------------------------------------------------------------- SuperBill Details Patient Name: Date of Service: [name] [name] F. 05/17/2020 Medical Record Number: [id number] Patient Account Number: [id number] Date of Birth/Sex: Treating RN: [birth year] (75 y.o. 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 L97.521 Non-pressure chronic ulcer of other part of left foot limited to breakdown of skin L97.211 Non-pressure chronic ulcer of right calf limited to breakdown of skin Facility Procedures CPT4 Code: [id number] Description: [phone] - WOUND CARE VISIT-LEV 5 EST PT Modifier: Quantity: 1 Physician Procedures : CPT4 Code Description Modifier [id number] - WC PHYS LEVEL 3 - EST PT ICD-10  Diagnosis Description E11.621 Type 2 diabetes mellitus with foot ulcer L89.613 Pressure ulcer of right heel, stage 3 L97.521 Non-pressure chronic ulcer of other part of left  foot limited to breakdown of skin L97.221 Non-pressure chronic ulcer of left calf limited to breakdown of skin Quantity: 1 Electronic Signature(s) Signed: 05/17/2020 5:14:25 PM By: Linton Ham MD Entered By: Linton Ham on 05/17/2020 10:07:12  moisturizing lotion as directed Prim Dressing: KerraCel Ag Gelling Fiber Dressing, 4x5 in (silver alginate) 1 x Per Week/15 Days ary Discharge Instructions: Apply silver alginate to wound bed as instructed Secondary Dressing: Woven Gauze Sponge, Non-Sterile 4x4 in 1 x Per Week/15 Days Discharge Instructions: Apply over primary dressing as directed. Secondary Dressing: ABD Pad, 5x9 1 x Per Week/15 Days Discharge Instructions: Apply over primary dressing as directed. Secured With: [employer]an Self-Adherent Wrap 4x5 (in/yd) 1 x Per Week/15 Days Discharge Instructions: Secure with Coban as directed. Secured With: [employer], 4.5x3.1 (in/yd) 1 x Per Week/15 Days Discharge Instructions: Secure with Kerlix as directed. 1. The patient has his arterial studies at 11:00 this morning. We cannot really wrap him will need to bring him back or perhaps they have enough dressing material at home 2. Silver collagen to the areas on  the left with tendon exposure silver alginate elsewhere. I am 3. I am at a real loss with this gentleman. He continues to have new wounds new skin deterioration almost every time he is here and I do not really have a great explanation for it. Initially he came in with bilateral large heel pressure ulcers these healed however since then we have been battling areas on his dorsal feet the left lateral leg and I am not sure I have a wonderful explanation for these. He had arterial studies in June 2020 monophasic waveforms but great toe pressures Electronic Signature(s) Signed: 05/17/2020 5:14:25 PM By: Linton Ham MD Entered By: Linton Ham on 05/17/2020 10:06:37 -------------------------------------------------------------------------------- SuperBill Details Patient Name: Date of Service: [name] [name] F. 05/17/2020 Medical Record Number: [id number] Patient Account Number: [id number] Date of Birth/Sex: Treating RN: [birth year] (75 y.o. 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 L97.521 Non-pressure chronic ulcer of other part of left foot limited to breakdown of skin L97.211 Non-pressure chronic ulcer of right calf limited to breakdown of skin Facility Procedures CPT4 Code: [id number] Description: [phone] - WOUND CARE VISIT-LEV 5 EST PT Modifier: Quantity: 1 Physician Procedures : CPT4 Code Description Modifier [id number] - WC PHYS LEVEL 3 - EST PT ICD-10  Diagnosis Description E11.621 Type 2 diabetes mellitus with foot ulcer L89.613 Pressure ulcer of right heel, stage 3 L97.521 Non-pressure chronic ulcer of other part of left  foot limited to breakdown of skin L97.221 Non-pressure chronic ulcer of left calf limited to breakdown of skin Quantity: 1 Electronic Signature(s) Signed: 05/17/2020 5:14:25 PM By: Linton Ham MD Entered By: Linton Ham on 05/17/2020 10:07:12

## 2020-05-18 NOTE — Progress Notes (Signed)
Hypertension, Artery Disease, Hypertension, Artery Disease, Hypertension, Myocardial Infarction, Type II Myocardial Infarction, Type II Myocardial Infarction, Type II Diabetes, Gout, Neuropathy Diabetes, Gout, Neuropathy Diabetes, Gout, Neuropathy 04/24/2020 05/03/2020 05/17/2020 Date Acquired: 3 2 0 Weeks of Treatment: Open Open Open Wound Status: No No No Clustered Wound: N/A N/A N/A Clustered Quantity: 1.2x1x0.1 1.9x1.3x0.1 0.4x0.4x0.1 Measurements L x W x D (cm) 0.942 1.94 0.126 A (cm) : rea 0.094 0.194 0.013 Volume (cm) : 34.10% -11.80% N/A % Reduction in  Area: 34.30% -11.50% N/A % Reduction in Volume: Full Thickness Without Exposed Grade 2 Grade 1 Classification: Support Structures Medium Medium Medium Exudate Amount: Serosanguineous Serosanguineous Serous Exudate Type: red, brown red, brown amber Exudate Color: Flat and Intact Flat and Intact Flat and Intact Wound Margin: Large (67-100%) Small (1-33%) Small (1-33%) Granulation Amount: Red, Pink Red Pink Granulation Quality: None Present (0%) Large (67-100%) Large (67-100%) Necrotic Amount: Fat Layer (Subcutaneous Tissue): Yes Fat Layer (Subcutaneous Tissue): Yes Fat Layer (Subcutaneous Tissue): Yes Exposed Structures: Fascia: No Fascia: No Fascia: No Tendon: No Tendon: No Tendon: No Muscle: No Muscle: No Muscle: No Joint: No Joint: No Joint: No Bone: No Bone: No Bone: No Small (1-33%) Small (1-33%) Small (1-33%) Epithelialization: Treatment Notes Electronic Signature(s) Signed: 05/17/2020 5:14:25 PM By: Linton Ham MD Signed: 05/18/2020 5:56:51 PM By: [name] Entered By: Linton Ham on 05/17/2020 10:00:07 -------------------------------------------------------------------------------- Multi-Disciplinary Care Plan Details Patient Name: Date of Service: [name] Foley F. 05/17/2020 8:45 A M Medical Record Number: [id number] Patient Account Number: [id number] Date of Birth/Sex: Treating RN: Jun 14, 1945 (75 y.o. [name], [name] Primary Care Haiven Nardone: [name], [name] Other Clinician: Referring Yariel Ferraris: Treating Blaire Palomino/Extender: [name], [name] [name], [name] Weeks in Treatment: 213 Active Inactive Wound/Skin Impairment Nursing Diagnoses: Impaired tissue integrity Goals: Patient/caregiver will verbalize understanding of skin care regimen Date Initiated: 11/19/2017 Target Resolution Date: 06/24/2020 Goal Status: Active Ulcer/skin breakdown will have a volume reduction of 30% by week 4 Date Initiated: 11/19/2017 Date Inactivated:  12/02/2017 Target Resolution Date: 12/22/2017 Goal Status: Unmet Unmet Reason: multipl comorbidities Interventions: Assess patient/caregiver ability to obtain necessary supplies Assess ulceration(s) every visit Provide education on ulcer and skin care Notes: Electronic Signature(s) Signed: 05/18/2020 5:56:51 PM By: [name] Entered By: [name] on 05/17/2020 17:04:36 -------------------------------------------------------------------------------- Pain Assessment Details Patient Name: Date of Service: [name] Foley F. 05/17/2020 8:45 A M Medical Record Number: [id number] Patient Account Number: [id number] Date of Birth/Sex: Treating RN: 03/09/1946 (75 y.o. [name] Primary Care Nijae Doyel: [name], [name] Other Clinician: Referring Miya Luviano: Treating Piper Hassebrock/Extender: [name], [name] [name], [name] Weeks in Treatment: 213 Active Problems Location of Pain Severity and Description of Pain Patient Has Paino No Site Locations Rate the pain. Rate the pain. Current Pain Level: 0 Pain Management and Medication Current Pain Management: Electronic Signature(s) Signed: 05/17/2020 5:42:27 PM By: Baruch Gouty RN, BSN Entered By: Baruch Gouty on 05/17/2020 09:03:25 -------------------------------------------------------------------------------- Patient/Caregiver Education Details Patient Name: Date of Service: [name] [name] 1/25/2022andnbsp8:45 A M Medical Record Number: [id number] Patient Account Number: [id number] Date of Birth/Gender: Treating RN: 20-Nov-1945 (75 y.o. [name] Primary Care Physician: [name], [name] Other Clinician: Referring Physician: Treating Physician/Extender: [name], [name] [name], [name] Weeks in Treatment: 213 Education Assessment Education Provided To: Patient Education Topics Provided Wound/Skin Impairment: Handouts: Caring for Your Ulcer Methods: Explain/Verbal Responses: Reinforcements  needed Electronic Signature(s) Signed: 05/18/2020 5:56:51 PM By: [name] Entered By: [name] on 05/17/2020 10:02:44 -------------------------------------------------------------------------------- Wound Assessment Details Patient Name: Date of Service: [name] [name]  HNNY F. 05/17/2020 8:45 A M Medical Record Number: [id number] Patient Account Number: [id number] Date of Birth/Sex: Treating RN: November 23, 1945 (75 y.o. [name] Primary Care Eulice Rutledge: [name], [name] Other Clinician: Referring Kimmi Acocella: Treating Torunn Chancellor/Extender: [name], [name] [name], [name] Weeks in Treatment: 213 Wound Status Wound Number: 14 Primary Diabetic Wound/Ulcer of the Lower Extremity Etiology: Wound Location: Left, Lateral Lower Leg Wound Open Wounding Event: Gradually Appeared Status: Date Acquired: 08/15/2017 Comorbid Anemia, Sleep Apnea, Arrhythmia, Congestive Heart Failure, Weeks Of Treatment: 143 History: Coronary Artery Disease, Hypertension, Myocardial Infarction, Clustered Wound: Yes Type II Diabetes, Gout, Neuropathy Wound Measurements Length: (cm) 9 Width: (cm) 1.3 Depth: (cm) 0.2 Clustered Quantity: 3 Area: (cm) 9.189 Volume: (cm) 1.838 % Reduction in Area: -875.5% % Reduction in Volume: -1855.3% Epithelialization: Small (1-33%) Tunneling: No Undermining: No Wound Description Classification: Grade 2 Wound Margin: Flat and Intact Exudate Amount: Large Exudate Type: Serosanguineous Exudate Color: red, brown Foul Odor After Cleansing: No Slough/Fibrino Yes Wound Bed Granulation Amount: Large (67-100%) Exposed Structure Granulation Quality: Red, Hyper-granulation Fascia Exposed: No Necrotic Amount: Small (1-33%) Fat Layer (Subcutaneous Tissue) Exposed: Yes Necrotic Quality: Adherent Slough Tendon Exposed: No Muscle Exposed: No Joint Exposed: No Bone Exposed: No Treatment Notes Wound #14 (Lower Leg) Wound Laterality: Left, Lateral Cleanser Soap  and Water Discharge Instruction: May shower and wash wound with dial antibacterial soap and water prior to dressing change. Peri-Wound Care Sween Lotion (Moisturizing lotion) Discharge Instruction: Apply moisturizing lotion as directed Topical Primary Dressing KerraCel Ag Gelling Fiber Dressing, 4x5 in (silver alginate) Discharge Instruction: Apply silver alginate to wound bed as instructed Secondary Dressing Woven Gauze Sponge, Non-Sterile 4x4 in Discharge Instruction: Apply over primary dressing as directed. ABD Pad, 5x9 Discharge Instruction: Apply over primary dressing as directed. Secured With [employer] 4x5 (in/yd) Discharge Instruction: Secure with Coban as directed. Kerlix Roll Sterile, 4.5x3.1 (in/yd) Discharge Instruction: Secure with Kerlix as directed. Compression Wrap Compression Stockings Add-Ons Electronic Signature(s) Signed: 05/17/2020 5:42:27 PM By: Baruch Gouty RN, BSN Signed: 05/17/2020 5:42:27 PM By: Baruch Gouty RN, BSN Entered By: Baruch Gouty on 05/17/2020 09:32:24 -------------------------------------------------------------------------------- Wound Assessment Details Patient Name: Date of Service: [name] Foley F. 05/17/2020 8:45 A M Medical Record Number: [id number] Patient Account Number: [id number] Date of Birth/Sex: Treating RN: 11/05/1945 (75 y.o. [name] Primary Care Judythe Postema: [name], [name] Other Clinician: Referring Janine Reller: Treating Kimbly Eanes/Extender: [name], [name] [name], [name] Weeks in Treatment: 213 Wound Status Wound Number: 25 Primary Venous Leg Ulcer Etiology: Wound Location: Right, Lateral Lower Leg Wound Open Wounding Event: Gradually Appeared Status: Date Acquired: 07/06/2019 Comorbid Anemia, Sleep Apnea, Arrhythmia, Congestive Heart Failure, Weeks Of Treatment: 43 History: Coronary Artery Disease, Hypertension, Myocardial Infarction, Clustered Wound: No Type II Diabetes, Gout,  Neuropathy Wound Measurements Length: (cm) 1.4 Width: (cm) 1 Depth: (cm) 0.1 Area: (cm) 1.1 Volume: (cm) 0.11 % Reduction in Area: 59.8% % Reduction in Volume: 59.7% Epithelialization: Medium (34-66%) Tunneling: No Undermining: No Wound Description Classification: Full Thickness Without Exposed Support Structures Wound Margin: Flat and Intact Exudate Amount: Medium Exudate Type: Serosanguineous Exudate Color: red, brown Foul Odor After Cleansing: No Slough/Fibrino No Wound Bed Granulation Amount: Medium (34-66%) Exposed Structure Granulation Quality: Red Fascia Exposed: No Necrotic Amount: Medium (34-66%) Fat Layer (Subcutaneous Tissue) Exposed: Yes Necrotic Quality: Adherent Slough Tendon Exposed: No Muscle Exposed: No Joint Exposed: No Bone Exposed: No Treatment Notes Wound #25 (Lower Leg) Wound Laterality: Right, Lateral Cleanser Soap and Water Discharge Instruction: May shower and wash wound with dial antibacterial soap and water prior  Hypertension, Artery Disease, Hypertension, Artery Disease, Hypertension, Myocardial Infarction, Type II Myocardial Infarction, Type II Myocardial Infarction, Type II Diabetes, Gout, Neuropathy Diabetes, Gout, Neuropathy Diabetes, Gout, Neuropathy 04/24/2020 05/03/2020 05/17/2020 Date Acquired: 3 2 0 Weeks of Treatment: Open Open Open Wound Status: No No No Clustered Wound: N/A N/A N/A Clustered Quantity: 1.2x1x0.1 1.9x1.3x0.1 0.4x0.4x0.1 Measurements L x W x D (cm) 0.942 1.94 0.126 A (cm) : rea 0.094 0.194 0.013 Volume (cm) : 34.10% -11.80% N/A % Reduction in  Area: 34.30% -11.50% N/A % Reduction in Volume: Full Thickness Without Exposed Grade 2 Grade 1 Classification: Support Structures Medium Medium Medium Exudate Amount: Serosanguineous Serosanguineous Serous Exudate Type: red, brown red, brown amber Exudate Color: Flat and Intact Flat and Intact Flat and Intact Wound Margin: Large (67-100%) Small (1-33%) Small (1-33%) Granulation Amount: Red, Pink Red Pink Granulation Quality: None Present (0%) Large (67-100%) Large (67-100%) Necrotic Amount: Fat Layer (Subcutaneous Tissue): Yes Fat Layer (Subcutaneous Tissue): Yes Fat Layer (Subcutaneous Tissue): Yes Exposed Structures: Fascia: No Fascia: No Fascia: No Tendon: No Tendon: No Tendon: No Muscle: No Muscle: No Muscle: No Joint: No Joint: No Joint: No Bone: No Bone: No Bone: No Small (1-33%) Small (1-33%) Small (1-33%) Epithelialization: Treatment Notes Electronic Signature(s) Signed: 05/17/2020 5:14:25 PM By: Linton Ham MD Signed: 05/18/2020 5:56:51 PM By: [name] Entered By: Linton Ham on 05/17/2020 10:00:07 -------------------------------------------------------------------------------- Multi-Disciplinary Care Plan Details Patient Name: Date of Service: [name] Foley F. 05/17/2020 8:45 A M Medical Record Number: [id number] Patient Account Number: [id number] Date of Birth/Sex: Treating RN: Jun 14, 1945 (75 y.o. [name], [name] Primary Care Haiven Nardone: [name], [name] Other Clinician: Referring Yariel Ferraris: Treating Blaire Palomino/Extender: [name], [name] [name], [name] Weeks in Treatment: 213 Active Inactive Wound/Skin Impairment Nursing Diagnoses: Impaired tissue integrity Goals: Patient/caregiver will verbalize understanding of skin care regimen Date Initiated: 11/19/2017 Target Resolution Date: 06/24/2020 Goal Status: Active Ulcer/skin breakdown will have a volume reduction of 30% by week 4 Date Initiated: 11/19/2017 Date Inactivated:  12/02/2017 Target Resolution Date: 12/22/2017 Goal Status: Unmet Unmet Reason: multipl comorbidities Interventions: Assess patient/caregiver ability to obtain necessary supplies Assess ulceration(s) every visit Provide education on ulcer and skin care Notes: Electronic Signature(s) Signed: 05/18/2020 5:56:51 PM By: [name] Entered By: [name] on 05/17/2020 17:04:36 -------------------------------------------------------------------------------- Pain Assessment Details Patient Name: Date of Service: [name] Foley F. 05/17/2020 8:45 A M Medical Record Number: [id number] Patient Account Number: [id number] Date of Birth/Sex: Treating RN: 03/09/1946 (75 y.o. [name] Primary Care Nijae Doyel: [name], [name] Other Clinician: Referring Miya Luviano: Treating Piper Hassebrock/Extender: [name], [name] [name], [name] Weeks in Treatment: 213 Active Problems Location of Pain Severity and Description of Pain Patient Has Paino No Site Locations Rate the pain. Rate the pain. Current Pain Level: 0 Pain Management and Medication Current Pain Management: Electronic Signature(s) Signed: 05/17/2020 5:42:27 PM By: Baruch Gouty RN, BSN Entered By: Baruch Gouty on 05/17/2020 09:03:25 -------------------------------------------------------------------------------- Patient/Caregiver Education Details Patient Name: Date of Service: [name] [name] 1/25/2022andnbsp8:45 A M Medical Record Number: [id number] Patient Account Number: [id number] Date of Birth/Gender: Treating RN: 20-Nov-1945 (75 y.o. [name] Primary Care Physician: [name], [name] Other Clinician: Referring Physician: Treating Physician/Extender: [name], [name] [name], [name] Weeks in Treatment: 213 Education Assessment Education Provided To: Patient Education Topics Provided Wound/Skin Impairment: Handouts: Caring for Your Ulcer Methods: Explain/Verbal Responses: Reinforcements  needed Electronic Signature(s) Signed: 05/18/2020 5:56:51 PM By: [name] Entered By: [name] on 05/17/2020 10:02:44 -------------------------------------------------------------------------------- Wound Assessment Details Patient Name: Date of Service: [name] [name]  Hypertension, Artery Disease, Hypertension, Artery Disease, Hypertension, Myocardial Infarction, Type II Myocardial Infarction, Type II Myocardial Infarction, Type II Diabetes, Gout, Neuropathy Diabetes, Gout, Neuropathy Diabetes, Gout, Neuropathy 04/24/2020 05/03/2020 05/17/2020 Date Acquired: 3 2 0 Weeks of Treatment: Open Open Open Wound Status: No No No Clustered Wound: N/A N/A N/A Clustered Quantity: 1.2x1x0.1 1.9x1.3x0.1 0.4x0.4x0.1 Measurements L x W x D (cm) 0.942 1.94 0.126 A (cm) : rea 0.094 0.194 0.013 Volume (cm) : 34.10% -11.80% N/A % Reduction in  Area: 34.30% -11.50% N/A % Reduction in Volume: Full Thickness Without Exposed Grade 2 Grade 1 Classification: Support Structures Medium Medium Medium Exudate Amount: Serosanguineous Serosanguineous Serous Exudate Type: red, brown red, brown amber Exudate Color: Flat and Intact Flat and Intact Flat and Intact Wound Margin: Large (67-100%) Small (1-33%) Small (1-33%) Granulation Amount: Red, Pink Red Pink Granulation Quality: None Present (0%) Large (67-100%) Large (67-100%) Necrotic Amount: Fat Layer (Subcutaneous Tissue): Yes Fat Layer (Subcutaneous Tissue): Yes Fat Layer (Subcutaneous Tissue): Yes Exposed Structures: Fascia: No Fascia: No Fascia: No Tendon: No Tendon: No Tendon: No Muscle: No Muscle: No Muscle: No Joint: No Joint: No Joint: No Bone: No Bone: No Bone: No Small (1-33%) Small (1-33%) Small (1-33%) Epithelialization: Treatment Notes Electronic Signature(s) Signed: 05/17/2020 5:14:25 PM By: Linton Ham MD Signed: 05/18/2020 5:56:51 PM By: [name] Entered By: Linton Ham on 05/17/2020 10:00:07 -------------------------------------------------------------------------------- Multi-Disciplinary Care Plan Details Patient Name: Date of Service: [name] Foley F. 05/17/2020 8:45 A M Medical Record Number: [id number] Patient Account Number: [id number] Date of Birth/Sex: Treating RN: Jun 14, 1945 (75 y.o. [name], [name] Primary Care Haiven Nardone: [name], [name] Other Clinician: Referring Yariel Ferraris: Treating Blaire Palomino/Extender: [name], [name] [name], [name] Weeks in Treatment: 213 Active Inactive Wound/Skin Impairment Nursing Diagnoses: Impaired tissue integrity Goals: Patient/caregiver will verbalize understanding of skin care regimen Date Initiated: 11/19/2017 Target Resolution Date: 06/24/2020 Goal Status: Active Ulcer/skin breakdown will have a volume reduction of 30% by week 4 Date Initiated: 11/19/2017 Date Inactivated:  12/02/2017 Target Resolution Date: 12/22/2017 Goal Status: Unmet Unmet Reason: multipl comorbidities Interventions: Assess patient/caregiver ability to obtain necessary supplies Assess ulceration(s) every visit Provide education on ulcer and skin care Notes: Electronic Signature(s) Signed: 05/18/2020 5:56:51 PM By: [name] Entered By: [name] on 05/17/2020 17:04:36 -------------------------------------------------------------------------------- Pain Assessment Details Patient Name: Date of Service: [name] Foley F. 05/17/2020 8:45 A M Medical Record Number: [id number] Patient Account Number: [id number] Date of Birth/Sex: Treating RN: 03/09/1946 (75 y.o. [name] Primary Care Nijae Doyel: [name], [name] Other Clinician: Referring Miya Luviano: Treating Piper Hassebrock/Extender: [name], [name] [name], [name] Weeks in Treatment: 213 Active Problems Location of Pain Severity and Description of Pain Patient Has Paino No Site Locations Rate the pain. Rate the pain. Current Pain Level: 0 Pain Management and Medication Current Pain Management: Electronic Signature(s) Signed: 05/17/2020 5:42:27 PM By: Baruch Gouty RN, BSN Entered By: Baruch Gouty on 05/17/2020 09:03:25 -------------------------------------------------------------------------------- Patient/Caregiver Education Details Patient Name: Date of Service: [name] [name] 1/25/2022andnbsp8:45 A M Medical Record Number: [id number] Patient Account Number: [id number] Date of Birth/Gender: Treating RN: 20-Nov-1945 (75 y.o. [name] Primary Care Physician: [name], [name] Other Clinician: Referring Physician: Treating Physician/Extender: [name], [name] [name], [name] Weeks in Treatment: 213 Education Assessment Education Provided To: Patient Education Topics Provided Wound/Skin Impairment: Handouts: Caring for Your Ulcer Methods: Explain/Verbal Responses: Reinforcements  needed Electronic Signature(s) Signed: 05/18/2020 5:56:51 PM By: [name] Entered By: [name] on 05/17/2020 10:02:44 -------------------------------------------------------------------------------- Wound Assessment Details Patient Name: Date of Service: [name] [name]  Hypertension, Artery Disease, Hypertension, Artery Disease, Hypertension, Myocardial Infarction, Type II Myocardial Infarction, Type II Myocardial Infarction, Type II Diabetes, Gout, Neuropathy Diabetes, Gout, Neuropathy Diabetes, Gout, Neuropathy 04/24/2020 05/03/2020 05/17/2020 Date Acquired: 3 2 0 Weeks of Treatment: Open Open Open Wound Status: No No No Clustered Wound: N/A N/A N/A Clustered Quantity: 1.2x1x0.1 1.9x1.3x0.1 0.4x0.4x0.1 Measurements L x W x D (cm) 0.942 1.94 0.126 A (cm) : rea 0.094 0.194 0.013 Volume (cm) : 34.10% -11.80% N/A % Reduction in  Area: 34.30% -11.50% N/A % Reduction in Volume: Full Thickness Without Exposed Grade 2 Grade 1 Classification: Support Structures Medium Medium Medium Exudate Amount: Serosanguineous Serosanguineous Serous Exudate Type: red, brown red, brown amber Exudate Color: Flat and Intact Flat and Intact Flat and Intact Wound Margin: Large (67-100%) Small (1-33%) Small (1-33%) Granulation Amount: Red, Pink Red Pink Granulation Quality: None Present (0%) Large (67-100%) Large (67-100%) Necrotic Amount: Fat Layer (Subcutaneous Tissue): Yes Fat Layer (Subcutaneous Tissue): Yes Fat Layer (Subcutaneous Tissue): Yes Exposed Structures: Fascia: No Fascia: No Fascia: No Tendon: No Tendon: No Tendon: No Muscle: No Muscle: No Muscle: No Joint: No Joint: No Joint: No Bone: No Bone: No Bone: No Small (1-33%) Small (1-33%) Small (1-33%) Epithelialization: Treatment Notes Electronic Signature(s) Signed: 05/17/2020 5:14:25 PM By: Linton Ham MD Signed: 05/18/2020 5:56:51 PM By: [name] Entered By: Linton Ham on 05/17/2020 10:00:07 -------------------------------------------------------------------------------- Multi-Disciplinary Care Plan Details Patient Name: Date of Service: [name] Foley F. 05/17/2020 8:45 A M Medical Record Number: [id number] Patient Account Number: [id number] Date of Birth/Sex: Treating RN: Jun 14, 1945 (75 y.o. [name], [name] Primary Care Haiven Nardone: [name], [name] Other Clinician: Referring Yariel Ferraris: Treating Blaire Palomino/Extender: [name], [name] [name], [name] Weeks in Treatment: 213 Active Inactive Wound/Skin Impairment Nursing Diagnoses: Impaired tissue integrity Goals: Patient/caregiver will verbalize understanding of skin care regimen Date Initiated: 11/19/2017 Target Resolution Date: 06/24/2020 Goal Status: Active Ulcer/skin breakdown will have a volume reduction of 30% by week 4 Date Initiated: 11/19/2017 Date Inactivated:  12/02/2017 Target Resolution Date: 12/22/2017 Goal Status: Unmet Unmet Reason: multipl comorbidities Interventions: Assess patient/caregiver ability to obtain necessary supplies Assess ulceration(s) every visit Provide education on ulcer and skin care Notes: Electronic Signature(s) Signed: 05/18/2020 5:56:51 PM By: [name] Entered By: [name] on 05/17/2020 17:04:36 -------------------------------------------------------------------------------- Pain Assessment Details Patient Name: Date of Service: [name] Foley F. 05/17/2020 8:45 A M Medical Record Number: [id number] Patient Account Number: [id number] Date of Birth/Sex: Treating RN: 03/09/1946 (75 y.o. [name] Primary Care Nijae Doyel: [name], [name] Other Clinician: Referring Miya Luviano: Treating Piper Hassebrock/Extender: [name], [name] [name], [name] Weeks in Treatment: 213 Active Problems Location of Pain Severity and Description of Pain Patient Has Paino No Site Locations Rate the pain. Rate the pain. Current Pain Level: 0 Pain Management and Medication Current Pain Management: Electronic Signature(s) Signed: 05/17/2020 5:42:27 PM By: Baruch Gouty RN, BSN Entered By: Baruch Gouty on 05/17/2020 09:03:25 -------------------------------------------------------------------------------- Patient/Caregiver Education Details Patient Name: Date of Service: [name] [name] 1/25/2022andnbsp8:45 A M Medical Record Number: [id number] Patient Account Number: [id number] Date of Birth/Gender: Treating RN: 20-Nov-1945 (75 y.o. [name] Primary Care Physician: [name], [name] Other Clinician: Referring Physician: Treating Physician/Extender: [name], [name] [name], [name] Weeks in Treatment: 213 Education Assessment Education Provided To: Patient Education Topics Provided Wound/Skin Impairment: Handouts: Caring for Your Ulcer Methods: Explain/Verbal Responses: Reinforcements  needed Electronic Signature(s) Signed: 05/18/2020 5:56:51 PM By: [name] Entered By: [name] on 05/17/2020 10:02:44 -------------------------------------------------------------------------------- Wound Assessment Details Patient Name: Date of Service: [name] [name]  Hypertension, Artery Disease, Hypertension, Artery Disease, Hypertension, Myocardial Infarction, Type II Myocardial Infarction, Type II Myocardial Infarction, Type II Diabetes, Gout, Neuropathy Diabetes, Gout, Neuropathy Diabetes, Gout, Neuropathy 04/24/2020 05/03/2020 05/17/2020 Date Acquired: 3 2 0 Weeks of Treatment: Open Open Open Wound Status: No No No Clustered Wound: N/A N/A N/A Clustered Quantity: 1.2x1x0.1 1.9x1.3x0.1 0.4x0.4x0.1 Measurements L x W x D (cm) 0.942 1.94 0.126 A (cm) : rea 0.094 0.194 0.013 Volume (cm) : 34.10% -11.80% N/A % Reduction in  Area: 34.30% -11.50% N/A % Reduction in Volume: Full Thickness Without Exposed Grade 2 Grade 1 Classification: Support Structures Medium Medium Medium Exudate Amount: Serosanguineous Serosanguineous Serous Exudate Type: red, brown red, brown amber Exudate Color: Flat and Intact Flat and Intact Flat and Intact Wound Margin: Large (67-100%) Small (1-33%) Small (1-33%) Granulation Amount: Red, Pink Red Pink Granulation Quality: None Present (0%) Large (67-100%) Large (67-100%) Necrotic Amount: Fat Layer (Subcutaneous Tissue): Yes Fat Layer (Subcutaneous Tissue): Yes Fat Layer (Subcutaneous Tissue): Yes Exposed Structures: Fascia: No Fascia: No Fascia: No Tendon: No Tendon: No Tendon: No Muscle: No Muscle: No Muscle: No Joint: No Joint: No Joint: No Bone: No Bone: No Bone: No Small (1-33%) Small (1-33%) Small (1-33%) Epithelialization: Treatment Notes Electronic Signature(s) Signed: 05/17/2020 5:14:25 PM By: Linton Ham MD Signed: 05/18/2020 5:56:51 PM By: [name] Entered By: Linton Ham on 05/17/2020 10:00:07 -------------------------------------------------------------------------------- Multi-Disciplinary Care Plan Details Patient Name: Date of Service: [name] Foley F. 05/17/2020 8:45 A M Medical Record Number: [id number] Patient Account Number: [id number] Date of Birth/Sex: Treating RN: Jun 14, 1945 (75 y.o. [name], [name] Primary Care Haiven Nardone: [name], [name] Other Clinician: Referring Yariel Ferraris: Treating Blaire Palomino/Extender: [name], [name] [name], [name] Weeks in Treatment: 213 Active Inactive Wound/Skin Impairment Nursing Diagnoses: Impaired tissue integrity Goals: Patient/caregiver will verbalize understanding of skin care regimen Date Initiated: 11/19/2017 Target Resolution Date: 06/24/2020 Goal Status: Active Ulcer/skin breakdown will have a volume reduction of 30% by week 4 Date Initiated: 11/19/2017 Date Inactivated:  12/02/2017 Target Resolution Date: 12/22/2017 Goal Status: Unmet Unmet Reason: multipl comorbidities Interventions: Assess patient/caregiver ability to obtain necessary supplies Assess ulceration(s) every visit Provide education on ulcer and skin care Notes: Electronic Signature(s) Signed: 05/18/2020 5:56:51 PM By: [name] Entered By: [name] on 05/17/2020 17:04:36 -------------------------------------------------------------------------------- Pain Assessment Details Patient Name: Date of Service: [name] Foley F. 05/17/2020 8:45 A M Medical Record Number: [id number] Patient Account Number: [id number] Date of Birth/Sex: Treating RN: 03/09/1946 (75 y.o. [name] Primary Care Nijae Doyel: [name], [name] Other Clinician: Referring Miya Luviano: Treating Piper Hassebrock/Extender: [name], [name] [name], [name] Weeks in Treatment: 213 Active Problems Location of Pain Severity and Description of Pain Patient Has Paino No Site Locations Rate the pain. Rate the pain. Current Pain Level: 0 Pain Management and Medication Current Pain Management: Electronic Signature(s) Signed: 05/17/2020 5:42:27 PM By: Baruch Gouty RN, BSN Entered By: Baruch Gouty on 05/17/2020 09:03:25 -------------------------------------------------------------------------------- Patient/Caregiver Education Details Patient Name: Date of Service: [name] [name] 1/25/2022andnbsp8:45 A M Medical Record Number: [id number] Patient Account Number: [id number] Date of Birth/Gender: Treating RN: 20-Nov-1945 (75 y.o. [name] Primary Care Physician: [name], [name] Other Clinician: Referring Physician: Treating Physician/Extender: [name], [name] [name], [name] Weeks in Treatment: 213 Education Assessment Education Provided To: Patient Education Topics Provided Wound/Skin Impairment: Handouts: Caring for Your Ulcer Methods: Explain/Verbal Responses: Reinforcements  needed Electronic Signature(s) Signed: 05/18/2020 5:56:51 PM By: [name] Entered By: [name] on 05/17/2020 10:02:44 -------------------------------------------------------------------------------- Wound Assessment Details Patient Name: Date of Service: [name] [name]  to dressing change. Peri-Wound Care Sween Lotion (Moisturizing lotion) Discharge Instruction: Apply moisturizing lotion as directed Topical Primary Dressing KerraCel Ag Gelling Fiber Dressing, 4x5 in (silver alginate) Discharge Instruction: Apply silver alginate to wound bed as instructed Secondary Dressing Woven Gauze Sponge, Non-Sterile 4x4 in Discharge Instruction: Apply over primary dressing as directed. ABD Pad, 5x9 Discharge Instruction: Apply over primary dressing as directed. Secured With [employer] 4x5 (in/yd) Discharge Instruction: Secure with Coban as directed. Kerlix Roll Sterile, 4.5x3.1 (in/yd) Discharge Instruction: Secure with Kerlix as directed. Compression Wrap Compression Stockings Add-Ons Electronic Signature(s) Signed: 05/17/2020 5:42:27 PM By: Baruch Gouty RN, BSN Entered By: Baruch Gouty on 05/17/2020  09:32:47 -------------------------------------------------------------------------------- Wound Assessment Details Patient Name: Date of Service: [name] Foley F. 05/17/2020 8:45 A M Medical Record Number: [id number] Patient Account Number: [id number] Date of Birth/Sex: Treating RN: 10-15-1945 (75 y.o. [name] Primary Care Thanya Cegielski: [name], [name] Other Clinician: Referring Hartley Wyke: Treating Aadit Hagood/Extender: [name], [name] [name], [name] Weeks in Treatment: 213 Wound Status Wound Number: 26 Primary Diabetic Wound/Ulcer of the Lower Extremity Etiology: Wound Location: Left, Dorsal Foot Wound Open Wounding Event: Gradually Appeared Status: Date Acquired: 08/18/2019 Comorbid Anemia, Sleep Apnea, Arrhythmia, Congestive Heart Failure, Weeks Of Treatment: 39 History: Coronary Artery Disease, Hypertension, Myocardial Infarction, Clustered Wound: Yes Type II Diabetes, Gout, Neuropathy Wound Measurements Length: (cm) 3.2 Width: (cm) 3.5 Depth: (cm) 0.1 Clustered Quantity: 5 Area: (cm) 8.796 Volume: (cm) 0.88 % Reduction in Area: -1499.3% % Reduction in Volume: -1500% Epithelialization: None Tunneling: No Undermining: No Wound Description Classification: Grade 2 Wound Margin: Flat and Intact Exudate Amount: Medium Exudate Type: Serosanguineous Exudate Color: red, brown Foul Odor After Cleansing: No Slough/Fibrino Yes Wound Bed Granulation Amount: Small (1-33%) Exposed Structure Granulation Quality: Red Fascia Exposed: No Necrotic Amount: Medium (34-66%) Fat Layer (Subcutaneous Tissue) Exposed: Yes Necrotic Quality: Adherent Slough Tendon Exposed: No Muscle Exposed: No Joint Exposed: No Bone Exposed: No Treatment Notes Wound #26 (Foot) Wound Laterality: Dorsal, Left Cleanser Soap and Water Discharge Instruction: May shower and wash wound with dial antibacterial soap and water prior to dressing change. Peri-Wound Care Sween Lotion (Moisturizing  lotion) Discharge Instruction: Apply moisturizing lotion as directed Topical Primary Dressing Promogran Prisma Matrix, 4.34 (sq in) (silver collagen) Discharge Instruction: Moisten collagen with saline or hydrogel Secondary Dressing Woven Gauze Sponge, Non-Sterile 4x4 in Discharge Instruction: Apply over primary dressing as directed. ABD Pad, 5x9 Discharge Instruction: Apply over primary dressing as directed. Secured With [employer] 4x5 (in/yd) Discharge Instruction: Secure with Coban as directed. Kerlix Roll Sterile, 4.5x3.1 (in/yd) Discharge Instruction: Secure with Kerlix as directed. Compression Wrap Compression Stockings Add-Ons Electronic Signature(s) Signed: 05/17/2020 5:42:27 PM By: Baruch Gouty RN, BSN Entered By: Baruch Gouty on 05/17/2020 09:33:12 -------------------------------------------------------------------------------- Wound Assessment Details Patient Name: Date of Service: [name] Foley F. 05/17/2020 8:45 A M Medical Record Number: [id number] Patient Account Number: [id number] Date of Birth/Sex: Treating RN: 1946/03/12 (75 y.o. [name] Primary Care Donoven Pett: [name], [name] Other Clinician: Referring Jered Heiny: Treating Eiliyah Reh/Extender: [name], [name] [name], [name] Weeks in Treatment: 213 Wound Status Wound Number: 28 Primary Diabetic Wound/Ulcer of the Lower Extremity Etiology: Wound Location: Right, Dorsal Foot Wound Open Wounding Event: Gradually Appeared Status: Date Acquired: 04/26/2020 Comorbid Anemia, Sleep Apnea, Arrhythmia, Congestive Heart Failure, Weeks Of Treatment: 3 History: Coronary Artery Disease, Hypertension, Myocardial Infarction, Clustered Wound: Yes Type II Diabetes, Gout, Neuropathy Wound Measurements Length: (cm) 0.7 Width: (cm) 1.3 Depth: (cm) 0.1 Clustered Quantity: 2 Area: (cm) 0.715 Volume: (cm) 0.071 %  to dressing change. Peri-Wound Care Sween Lotion (Moisturizing lotion) Discharge Instruction: Apply moisturizing lotion as directed Topical Primary Dressing KerraCel Ag Gelling Fiber Dressing, 4x5 in (silver alginate) Discharge Instruction: Apply silver alginate to wound bed as instructed Secondary Dressing Woven Gauze Sponge, Non-Sterile 4x4 in Discharge Instruction: Apply over primary dressing as directed. ABD Pad, 5x9 Discharge Instruction: Apply over primary dressing as directed. Secured With [employer] 4x5 (in/yd) Discharge Instruction: Secure with Coban as directed. Kerlix Roll Sterile, 4.5x3.1 (in/yd) Discharge Instruction: Secure with Kerlix as directed. Compression Wrap Compression Stockings Add-Ons Electronic Signature(s) Signed: 05/17/2020 5:42:27 PM By: Baruch Gouty RN, BSN Entered By: Baruch Gouty on 05/17/2020  09:32:47 -------------------------------------------------------------------------------- Wound Assessment Details Patient Name: Date of Service: [name] Foley F. 05/17/2020 8:45 A M Medical Record Number: [id number] Patient Account Number: [id number] Date of Birth/Sex: Treating RN: 10-15-1945 (75 y.o. [name] Primary Care Thanya Cegielski: [name], [name] Other Clinician: Referring Hartley Wyke: Treating Aadit Hagood/Extender: [name], [name] [name], [name] Weeks in Treatment: 213 Wound Status Wound Number: 26 Primary Diabetic Wound/Ulcer of the Lower Extremity Etiology: Wound Location: Left, Dorsal Foot Wound Open Wounding Event: Gradually Appeared Status: Date Acquired: 08/18/2019 Comorbid Anemia, Sleep Apnea, Arrhythmia, Congestive Heart Failure, Weeks Of Treatment: 39 History: Coronary Artery Disease, Hypertension, Myocardial Infarction, Clustered Wound: Yes Type II Diabetes, Gout, Neuropathy Wound Measurements Length: (cm) 3.2 Width: (cm) 3.5 Depth: (cm) 0.1 Clustered Quantity: 5 Area: (cm) 8.796 Volume: (cm) 0.88 % Reduction in Area: -1499.3% % Reduction in Volume: -1500% Epithelialization: None Tunneling: No Undermining: No Wound Description Classification: Grade 2 Wound Margin: Flat and Intact Exudate Amount: Medium Exudate Type: Serosanguineous Exudate Color: red, brown Foul Odor After Cleansing: No Slough/Fibrino Yes Wound Bed Granulation Amount: Small (1-33%) Exposed Structure Granulation Quality: Red Fascia Exposed: No Necrotic Amount: Medium (34-66%) Fat Layer (Subcutaneous Tissue) Exposed: Yes Necrotic Quality: Adherent Slough Tendon Exposed: No Muscle Exposed: No Joint Exposed: No Bone Exposed: No Treatment Notes Wound #26 (Foot) Wound Laterality: Dorsal, Left Cleanser Soap and Water Discharge Instruction: May shower and wash wound with dial antibacterial soap and water prior to dressing change. Peri-Wound Care Sween Lotion (Moisturizing  lotion) Discharge Instruction: Apply moisturizing lotion as directed Topical Primary Dressing Promogran Prisma Matrix, 4.34 (sq in) (silver collagen) Discharge Instruction: Moisten collagen with saline or hydrogel Secondary Dressing Woven Gauze Sponge, Non-Sterile 4x4 in Discharge Instruction: Apply over primary dressing as directed. ABD Pad, 5x9 Discharge Instruction: Apply over primary dressing as directed. Secured With [employer] 4x5 (in/yd) Discharge Instruction: Secure with Coban as directed. Kerlix Roll Sterile, 4.5x3.1 (in/yd) Discharge Instruction: Secure with Kerlix as directed. Compression Wrap Compression Stockings Add-Ons Electronic Signature(s) Signed: 05/17/2020 5:42:27 PM By: Baruch Gouty RN, BSN Entered By: Baruch Gouty on 05/17/2020 09:33:12 -------------------------------------------------------------------------------- Wound Assessment Details Patient Name: Date of Service: [name] Foley F. 05/17/2020 8:45 A M Medical Record Number: [id number] Patient Account Number: [id number] Date of Birth/Sex: Treating RN: 1946/03/12 (75 y.o. [name] Primary Care Donoven Pett: [name], [name] Other Clinician: Referring Jered Heiny: Treating Eiliyah Reh/Extender: [name], [name] [name], [name] Weeks in Treatment: 213 Wound Status Wound Number: 28 Primary Diabetic Wound/Ulcer of the Lower Extremity Etiology: Wound Location: Right, Dorsal Foot Wound Open Wounding Event: Gradually Appeared Status: Date Acquired: 04/26/2020 Comorbid Anemia, Sleep Apnea, Arrhythmia, Congestive Heart Failure, Weeks Of Treatment: 3 History: Coronary Artery Disease, Hypertension, Myocardial Infarction, Clustered Wound: Yes Type II Diabetes, Gout, Neuropathy Wound Measurements Length: (cm) 0.7 Width: (cm) 1.3 Depth: (cm) 0.1 Clustered Quantity: 2 Area: (cm) 0.715 Volume: (cm) 0.071 %  Reduction in Area: 31.1% % Reduction in Volume: 31.7% Epithelialization: Small  (1-33%) Tunneling: No Undermining: No Wound Description Classification: Grade 1 Wound Margin: Flat and Intact Exudate Amount: Small Exudate Type: Serosanguineous Exudate Color: red, brown Foul Odor After Cleansing: No Slough/Fibrino Yes Wound Bed Granulation Amount: Small (1-33%) Exposed Structure Granulation Quality: Pink Fascia Exposed: No Necrotic Amount: Large (67-100%) Fat Layer (Subcutaneous Tissue) Exposed: Yes Necrotic Quality: Adherent Slough Tendon Exposed: No Muscle Exposed: No Joint Exposed: No Bone Exposed: No Treatment Notes Wound #28 (Foot) Wound Laterality: Dorsal, Right Cleanser Soap and Water Discharge Instruction: May shower and wash wound with dial antibacterial soap and water prior to dressing change. Peri-Wound Care Sween Lotion (Moisturizing lotion) Discharge Instruction: Apply moisturizing lotion as directed Topical Primary Dressing KerraCel Ag Gelling Fiber Dressing, 4x5 in (silver alginate) Discharge Instruction: Apply silver alginate to wound bed as instructed Secondary Dressing Woven Gauze Sponge, Non-Sterile 4x4 in Discharge Instruction: Apply over primary dressing as directed. ABD Pad, 5x9 Discharge Instruction: Apply over primary dressing as directed. Secured With [employer] 4x5 (in/yd) Discharge Instruction: Secure with Coban as directed. Kerlix Roll Sterile, 4.5x3.1 (in/yd) Discharge Instruction: Secure with Kerlix as directed. Compression Wrap Compression Stockings Add-Ons Electronic Signature(s) Signed: 05/17/2020 5:42:27 PM By: Baruch Gouty RN, BSN Entered By: Baruch Gouty on 05/17/2020 09:33:40 -------------------------------------------------------------------------------- Wound Assessment Details Patient Name: Date of Service: [name] Foley F. 05/17/2020 8:45 A M Medical Record Number: [id number] Patient Account Number: [id number] Date of Birth/Sex: Treating RN: Oct 31, 1945 (75 y.o. [name] Primary Care Majd Tissue: [name], [name] Other Clinician: Referring Briauna Gilmartin: Treating Mathew Storck/Extender: [name], [name] [name], [name] Weeks in Treatment: 213 Wound Status Wound Number: 29 Primary Diabetic Wound/Ulcer of the Lower Extremity Etiology: Wound Location: Right Calcaneus Wound Open Wounding Event: Gradually Appeared Status: Date Acquired: 04/26/2020 Comorbid Anemia, Sleep Apnea, Arrhythmia, Congestive Heart Failure, Weeks Of Treatment: 3 History: Coronary Artery Disease, Hypertension, Myocardial Infarction, Clustered Wound: No Type II Diabetes, Gout, Neuropathy Wound Measurements Length: (cm) 0.1 Width: (cm) 0.1 Depth: (cm) 0.1 Area: (cm) 0.008 Volume: (cm) 0.001 % Reduction in Area: 99.6% % Reduction in Volume: 99.4% Epithelialization: Large (67-100%) Tunneling: No Undermining: No Wound Description Classification: Grade 1 Wound Margin: Flat and Intact Exudate Amount: Small Exudate Type: Serous Exudate Color: amber Foul Odor After Cleansing: No Slough/Fibrino No Wound Bed Granulation Amount: Small (1-33%) Exposed Structure Granulation Quality: Pink Fascia Exposed: No Necrotic Amount: None Present (0%) Fat Layer (Subcutaneous Tissue) Exposed: No Tendon Exposed: No Muscle Exposed: No Joint Exposed: No Bone Exposed: No Treatment Notes Wound #29 (Calcaneus) Wound Laterality: Right Cleanser Soap and Water Discharge Instruction: May shower and wash wound with dial antibacterial soap and water prior to dressing change. Peri-Wound Care Sween Lotion (Moisturizing lotion) Discharge Instruction: Apply moisturizing lotion as directed Topical Primary Dressing KerraCel Ag Gelling Fiber Dressing, 4x5 in (silver alginate) Discharge Instruction: Apply silver alginate to wound bed as instructed Secondary Dressing Woven Gauze Sponge, Non-Sterile 4x4 in Discharge Instruction: Apply over primary dressing as directed. ABD Pad, 5x9 Discharge Instruction: Apply  over primary dressing as directed. Secured With [employer] 4x5 (in/yd) Discharge Instruction: Secure with Coban as directed. Kerlix Roll Sterile, 4.5x3.1 (in/yd) Discharge Instruction: Secure with Kerlix as directed. Compression Wrap Compression Stockings Add-Ons Electronic Signature(s) Signed: 05/17/2020 5:42:27 PM By: Baruch Gouty RN, BSN Entered By: Baruch Gouty on 05/17/2020 09:34:05 -------------------------------------------------------------------------------- Wound Assessment Details Patient Name: Date of Service: [name] Foley F. 05/17/2020 8:45 A M Medical Record Number: [id number] Patient Account Number: [id number]  Hypertension, Artery Disease, Hypertension, Artery Disease, Hypertension, Myocardial Infarction, Type II Myocardial Infarction, Type II Myocardial Infarction, Type II Diabetes, Gout, Neuropathy Diabetes, Gout, Neuropathy Diabetes, Gout, Neuropathy 04/24/2020 05/03/2020 05/17/2020 Date Acquired: 3 2 0 Weeks of Treatment: Open Open Open Wound Status: No No No Clustered Wound: N/A N/A N/A Clustered Quantity: 1.2x1x0.1 1.9x1.3x0.1 0.4x0.4x0.1 Measurements L x W x D (cm) 0.942 1.94 0.126 A (cm) : rea 0.094 0.194 0.013 Volume (cm) : 34.10% -11.80% N/A % Reduction in  Area: 34.30% -11.50% N/A % Reduction in Volume: Full Thickness Without Exposed Grade 2 Grade 1 Classification: Support Structures Medium Medium Medium Exudate Amount: Serosanguineous Serosanguineous Serous Exudate Type: red, brown red, brown amber Exudate Color: Flat and Intact Flat and Intact Flat and Intact Wound Margin: Large (67-100%) Small (1-33%) Small (1-33%) Granulation Amount: Red, Pink Red Pink Granulation Quality: None Present (0%) Large (67-100%) Large (67-100%) Necrotic Amount: Fat Layer (Subcutaneous Tissue): Yes Fat Layer (Subcutaneous Tissue): Yes Fat Layer (Subcutaneous Tissue): Yes Exposed Structures: Fascia: No Fascia: No Fascia: No Tendon: No Tendon: No Tendon: No Muscle: No Muscle: No Muscle: No Joint: No Joint: No Joint: No Bone: No Bone: No Bone: No Small (1-33%) Small (1-33%) Small (1-33%) Epithelialization: Treatment Notes Electronic Signature(s) Signed: 05/17/2020 5:14:25 PM By: Linton Ham MD Signed: 05/18/2020 5:56:51 PM By: [name] Entered By: Linton Ham on 05/17/2020 10:00:07 -------------------------------------------------------------------------------- Multi-Disciplinary Care Plan Details Patient Name: Date of Service: [name] Foley F. 05/17/2020 8:45 A M Medical Record Number: [id number] Patient Account Number: [id number] Date of Birth/Sex: Treating RN: Jun 14, 1945 (75 y.o. [name], [name] Primary Care Haiven Nardone: [name], [name] Other Clinician: Referring Yariel Ferraris: Treating Blaire Palomino/Extender: [name], [name] [name], [name] Weeks in Treatment: 213 Active Inactive Wound/Skin Impairment Nursing Diagnoses: Impaired tissue integrity Goals: Patient/caregiver will verbalize understanding of skin care regimen Date Initiated: 11/19/2017 Target Resolution Date: 06/24/2020 Goal Status: Active Ulcer/skin breakdown will have a volume reduction of 30% by week 4 Date Initiated: 11/19/2017 Date Inactivated:  12/02/2017 Target Resolution Date: 12/22/2017 Goal Status: Unmet Unmet Reason: multipl comorbidities Interventions: Assess patient/caregiver ability to obtain necessary supplies Assess ulceration(s) every visit Provide education on ulcer and skin care Notes: Electronic Signature(s) Signed: 05/18/2020 5:56:51 PM By: [name] Entered By: [name] on 05/17/2020 17:04:36 -------------------------------------------------------------------------------- Pain Assessment Details Patient Name: Date of Service: [name] Foley F. 05/17/2020 8:45 A M Medical Record Number: [id number] Patient Account Number: [id number] Date of Birth/Sex: Treating RN: 03/09/1946 (75 y.o. [name] Primary Care Nijae Doyel: [name], [name] Other Clinician: Referring Miya Luviano: Treating Piper Hassebrock/Extender: [name], [name] [name], [name] Weeks in Treatment: 213 Active Problems Location of Pain Severity and Description of Pain Patient Has Paino No Site Locations Rate the pain. Rate the pain. Current Pain Level: 0 Pain Management and Medication Current Pain Management: Electronic Signature(s) Signed: 05/17/2020 5:42:27 PM By: Baruch Gouty RN, BSN Entered By: Baruch Gouty on 05/17/2020 09:03:25 -------------------------------------------------------------------------------- Patient/Caregiver Education Details Patient Name: Date of Service: [name] [name] 1/25/2022andnbsp8:45 A M Medical Record Number: [id number] Patient Account Number: [id number] Date of Birth/Gender: Treating RN: 20-Nov-1945 (75 y.o. [name] Primary Care Physician: [name], [name] Other Clinician: Referring Physician: Treating Physician/Extender: [name], [name] [name], [name] Weeks in Treatment: 213 Education Assessment Education Provided To: Patient Education Topics Provided Wound/Skin Impairment: Handouts: Caring for Your Ulcer Methods: Explain/Verbal Responses: Reinforcements  needed Electronic Signature(s) Signed: 05/18/2020 5:56:51 PM By: [name] Entered By: [name] on 05/17/2020 10:02:44 -------------------------------------------------------------------------------- Wound Assessment Details Patient Name: Date of Service: [name] [name]

## 2020-05-23 ENCOUNTER — Telehealth: Payer: Medicare HMO

## 2020-05-23 NOTE — Chronic Care Management (AMB) (Deleted)
Chronic Care Management Pharmacy  Name: [name] [name]   MRN: [id number]   DOB: [birth year]  Chief Complaint/ HPI  [name] [name],  75 y.o. , male presents for their Follow-Up CCM visit with the clinical pharmacist via telephone due to COVID-19 Pandemic.  PCP : [name], [name] G, MD  Their chronic conditions include: HTN, HLD, DM, HF, CAD, ventricular tachycardia, neurogenic bladder  Office Visits: 06/24/2019 [name], MD - Patient presented for office visit for follow up on recent ER visit. Stitches removed. A1c: 12.6. (06/24/2019). Lantus dose was increased from 30 units to 35 units. Humalog was added: 5-10 units before meals three times a day.   Consult Visit: 05/17/20 [name] Ham, MD (wound care): Patient presented for debridement of wounds.  03/29/20 [name] Aloe, MD (urology): Patient presented for follow up. Unable to access notes.  01/19/20 [name] Ham, MD (wound care): Patient presented for debridement of wounds.   12/29/19 [name] Aloe, MD (urology): Patient presented for follow up. Unable to access notes.  11/24/19 [name] Snuffer, MD (urology): Patient presented for follow up. Unable to access notes.  03/18/2019- Cardiology- [name] Bickers, MD- Patient presented for office visit for follow up. Patient presented to be stable. Patient to continue current regimen.   Medications: Outpatient Encounter Medications as of 05/23/2020  Medication Sig  . Alcohol Swabs (B-D SINGLE USE SWABS REGULAR) PADS USE TO TEST BLOOD SUGAR DAILY  . amiodarone (PACERONE) 200 MG tablet TAKE 1 TABLET EVERY DAY  . Ascorbic Acid (VITAMIN C PO) Take 1 tablet by mouth daily. 1057m  . aspirin EC 81 MG tablet Take 81 mg by mouth daily.  . Blood Glucose Calibration (TRUE METRIX LEVEL 1) Low SOLN USE TO TEST BLOOD SUGAR DAILY  . Blood Glucose Monitoring Suppl (TRUE METRIX AIR GLUCOSE METER) w/Device KIT 1 Device by Does not apply route daily.  . Blood Pressure Monitoring (BLOOD PRESSURE  MONITOR AUTOMAT) DEVI To check BP daily  . carvedilol (COREG) 3.125 MG tablet Take 1 tablet (3.125 mg total) by mouth 2 (two) times daily with a meal. Needs appt for future refills  . clopidogrel (PLAVIX) 75 MG tablet TAKE 1 TABLET EVERY DAY  . clotrimazole-betamethasone (LOTRISONE) cream Apply 1 application topically daily as needed. mall amount. (Patient not taking: Reported on 08/19/2019)  . finasteride (PROSCAR) 5 MG tablet TAKE 1 TABLET EVERY DAY  . HYDROcodone-acetaminophen (NORCO/VICODIN) 5-325 MG tablet Take 1 tablet by mouth every 6 (six) hours as needed for moderate pain.  .Marland Kitcheninsulin aspart (NOVOLOG) 100 UNIT/ML injection Inject 10 Units into the skin 3 (three) times daily with meals. You can add extra insulin for sugars over 150 to the mealtime dose based on the sugar before the meal: Add 1 unit of NovoLog if your sugar is 150-200 Add 3 units of NovoLog if your sugar is 201-250 Add 5 units of NovoLog if your sugar is 251-300 Add 7 units of NovoLog if your sugar is over 301  . insulin glargine (LANTUS SOLOSTAR) 100 UNIT/ML Solostar Pen Inject 35 Units into the skin at bedtime.  .Marland Kitchenlosartan (COZAAR) 25 MG tablet TAKE 1/2 TABLET TWICE DAILY  . MAGNESIUM PO Take 400 mg by mouth every other day.   . mirtazapine (REMERON) 15 MG tablet TAKE 1 TABLET (15 MG TOTAL) BY MOUTH AT BEDTIME.  . multivitamin-iron-minerals-folic acid (THERAPEUTIC-M) TABS tablet Take 1 tablet by mouth daily. Gummies- one a day MEN  . nystatin (MYCOSTATIN/NYSTOP) powder Apply topically 3 (three) times daily. (Patient taking differently: Apply topically as  needed. )  . Probiotic Product (PROBIOTIC PO) Take 1 capsule by mouth daily.  . rosuvastatin (CRESTOR) 40 MG tablet TAKE 1 TABLET EVERY DAY  . SANTYL ointment   . tamsulosin (FLOMAX) 0.4 MG CAPS capsule TAKE 1 CAPSULE EVERY DAY  . torsemide (DEMADEX) 20 MG tablet TAKE 1 TABLET EVERY OTHER DAY  . TRUE METRIX BLOOD GLUCOSE TEST test strip USE  AS  INSTRUCTED  . TRUEplus  Lancets 30G MISC USE  TO  TEST BLOOD SUGAR EVERY DAY  . vitamin A 10000 UNIT capsule Take 10,000 Units by mouth daily.  Marland Kitchen zinc sulfate 220 (50 Zn) MG capsule Take 1,000 mg by mouth daily.    No facility-administered encounter medications on file as of 05/23/2020.     Current Diagnosis/Assessment:  Goals Addressed   None       Diabetes  A1C goal of < 8%  Recent Relevant Labs: Lab Results  Component Value Date/Time   HGBA1C 12.6 (A) 06/24/2019 04:20 PM   HGBA1C 8.0 (A) 02/11/2018 09:29 AM   HGBA1C 7.7 (H) 09/10/2017 11:57 AM   HGBA1C 8.7 (H) 04/09/2017 12:44 PM   MICROALBUR 2.9 (H) 09/10/2017 11:57 AM   MICROALBUR 9.3 (H) 04/09/2017 12:44 PM    Checking BG: 3x per Day  Recent BG Readings:  AM: 117-118 Before lunch: 117-122  Before supper: 125   Patient notes big difference in BGs; all have been < 150, no BGs < 70 or 200    Patient has failed these meds in past: none   Patient is currently controlled on the following medications:   Insulin aspart (Novolog), inject 10 units three times daily with meals (add 1 unit for sugar: 150-200, add 3 units for sugar: 201-250, add 5 units for sugars: 251-300, add 7 units for sugars  Insulin glargine (Lantus Solostar), inject 35 units at bedtime   Last diabetic Eye exam: No results found for: HMDIABEYEEXA  - needs to set up appts.   Last diabetic Foot exam: No results found for: HMDIABFOOTEX   We discussed: how to recognize and treat signs of hypoglycemia  - Patient denies BGs < 70 and has not had symptoms of hypoglycemia -Recommended for patient to set up annual ophthalmology exam. -Discussed switching checking BGs after meals and before bedtime  Plan A1C is overdue - schedule appt with PCP for A1C check. Consider CGM depending on results of A1C. Continue current medications    Heart Failure  denies no current swelling. No sob at rest; only with activity.    Type: Combined Systolic and Diastolic  NYHA Class: III  (marked limitation of activity) AHA HF Stage: C (Heart disease and symptoms present)  Patient is currently controlled on the following medications:   Carvedilol (Coreg) 3.125, 1 tablet twice daily with a meal  Losartan 2m, 0.5 (one-half) tablet twice daily  Torsemide 26m 1 tablet every other day  We discussed weighing daily; if you gain more than 3 pounds in one day or 5 pounds in one week call your doctor  - patient unable to stand; patient is unmobile -Diet: Patient doesn't use salt on food and neither does wife who does the cooking; he reports rarely getting food from restaurants and does not eat frozen or canned foods.  Plan Managed by Dr. DaGlori BickersRecommended for patient to make a follow up as he is due in Nov. Continue current medications  Hypertension  Denies dizziness.   BP today is:  <130/80  Office blood pressures are  BP  Readings from Last 3 Encounters:  06/24/19 124/70  06/16/19 117/82  03/18/19 110/80    Patient home BP readings are ranging: 125/70 in AM prior to medications and 100/80 in PM  Patient is controlled on:   Carvedilol (Coreg) 3.125, 1 tablet twice daily with a meal  Losartan 97m, 0.5 (one-half) tablet twice daily  We discussed: checking BP after morning medications  Plan Managed by Dr. DGlori BickersContinue current medications   Hyperlipidemia/ ASCVD (CAD s/p bypass graft, ischemic cardiomyopathy, NSTEMI)  LDL goal < 70  Lipid Panel     Component Value Date/Time   CHOL 120 03/09/2014 0940   TRIG 123 03/09/2014 0940   HDL 39 (L) 03/09/2014 0940   CHOLHDL 3.1 03/09/2014 0940   VLDL 25 03/09/2014 0940   LDLCALC 56 03/09/2014 0940   LDLDIRECT 100.5 02/08/2012 0903    The ASCVD Risk score (Goff DC Jr., et al., 2013) failed to calculate for the following reasons:   The patient has a prior MI or stroke diagnosis    Patient is currently controlled on the following medications:   Rosuvastatin 454m 1 tablet once daily    Aspirin 8176m tablet once daily  Clopidogrel 71m68m tablet once daily  We dicussed: symptoms of bleeding (blood in urine or stool, nosebleeds, unexpected bruising)  Plan Managed by Dr. DaniGlori Bickerstient is overdue for lipid panel - last checked 2015. Continue current medications  Ventricular tachycardia  Patient has failed these meds in past: none  Patient is currently controlled on the following medications:   Amiodarone 200mg66mtablet once daily  Plan  Recommended obtain eye doc appt for monitoring  Continue current medications  Neurogenic bladder  Patient has failed these meds in past: none  Patient is currently controlled on the following medications:   Tamsulosin 0.4mg, 23mapsule once daily  Finasteride 5mg, 174mblet once daily  Patient has permanent cath and gets changed monthly   Plan Continue current medications   Unclear diagnosis  Patient has failed these meds in past: none  Patient is currently on the following medications:   Mirtazapine 15mg, 134mlet at bedtime (rest)   Plan Continue current medications   OTC/ supplements  Patient is currently on the following:  Ascorbic acid 1000mg, 1 67met twice daily  Clortrimazole- betamethasone (Lotrisone) cream, apply once daily as needed  Magnesium 400mg, 1 t37mt every other day  Multivitamin- iron-minerals-folic acid, 1 tablet once daily  Nystatin powder, apply three times daiiy  Can use corn starch.   Probiotic, 1 capsule once daily  Vitamin A 2400mcg  (109m units, 1 capsule once daily  Zinc sulfate 1000mg, 1 cap30m once daily  Vaccines   Reviewed and discussed patient's vaccination history.    Immunization History  Administered Date(s) Administered  . Influenza Split 01/01/2012  . Influenza Whole 04/24/2005, 01/29/2008, 01/13/2009  . Influenza, High Dose Seasonal PF 04/09/2017, 02/11/2018  . Influenza,inj,Quad PF,6+ Mos 03/22/2014  . Pneumococcal Conjugate-13  02/11/2018  . Pneumococcal Polysaccharide-23 04/11/2007, 12/19/2015  . Td 04/26/2003, 06/15/2008   Shingles, covid?  Plan  Recommended patient receive *** vaccine in *** office.   Medication Management  Patient organizes medications: pill box organized by daughter.  Barriers: no issues.  Primary pharmacy: [facility] Follow up Follow up visit with PharmD in 3 months.   Hasaan Radde PryJeni Sallesnical Pharmacist [place]alSte[place]ld [place]

## 2020-05-24 ENCOUNTER — Encounter (HOSPITAL_BASED_OUTPATIENT_CLINIC_OR_DEPARTMENT_OTHER): Payer: Medicare HMO | Admitting: Internal Medicine

## 2020-05-26 ENCOUNTER — Observation Stay (HOSPITAL_COMMUNITY)
Admission: EM | Admit: 2020-05-26 | Discharge: 2020-05-27 | Disposition: A | Discharge status: Home / Self Care | Payer: Medicare HMO | Attending: Student | Admitting: Student

## 2020-05-26 ENCOUNTER — Emergency Department (HOSPITAL_COMMUNITY): Payer: Medicare HMO

## 2020-05-26 ENCOUNTER — Other Ambulatory Visit: Payer: Self-pay

## 2020-05-26 DIAGNOSIS — Z79899 Other long term (current) drug therapy: Secondary | ICD-10-CM | POA: Diagnosis not present

## 2020-05-26 DIAGNOSIS — I5042 Chronic combined systolic (congestive) and diastolic (congestive) heart failure: Secondary | ICD-10-CM | POA: Diagnosis not present

## 2020-05-26 DIAGNOSIS — F05 Delirium due to known physiological condition: Secondary | ICD-10-CM | POA: Diagnosis not present

## 2020-05-26 DIAGNOSIS — Z7982 Long term (current) use of aspirin: Secondary | ICD-10-CM | POA: Diagnosis not present

## 2020-05-26 DIAGNOSIS — R0902 Hypoxemia: Secondary | ICD-10-CM | POA: Diagnosis not present

## 2020-05-26 DIAGNOSIS — E1121 Type 2 diabetes mellitus with diabetic nephropathy: Secondary | ICD-10-CM | POA: Diagnosis present

## 2020-05-26 DIAGNOSIS — I2510 Atherosclerotic heart disease of native coronary artery without angina pectoris: Secondary | ICD-10-CM | POA: Diagnosis not present

## 2020-05-26 DIAGNOSIS — N179 Acute kidney failure, unspecified: Secondary | ICD-10-CM | POA: Insufficient documentation

## 2020-05-26 DIAGNOSIS — R404 Transient alteration of awareness: Secondary | ICD-10-CM | POA: Diagnosis not present

## 2020-05-26 DIAGNOSIS — J101 Influenza due to other identified influenza virus with other respiratory manifestations: Secondary | ICD-10-CM | POA: Diagnosis present

## 2020-05-26 DIAGNOSIS — Z20822 Contact with and (suspected) exposure to covid-19: Secondary | ICD-10-CM | POA: Insufficient documentation

## 2020-05-26 DIAGNOSIS — R410 Disorientation, unspecified: Secondary | ICD-10-CM | POA: Diagnosis not present

## 2020-05-26 DIAGNOSIS — R9431 Abnormal electrocardiogram [ECG] [EKG]: Secondary | ICD-10-CM | POA: Diagnosis not present

## 2020-05-26 DIAGNOSIS — N311 Reflex neuropathic bladder, not elsewhere classified: Secondary | ICD-10-CM | POA: Diagnosis not present

## 2020-05-26 DIAGNOSIS — R7989 Other specified abnormal findings of blood chemistry: Secondary | ICD-10-CM | POA: Insufficient documentation

## 2020-05-26 DIAGNOSIS — I130 Hypertensive heart and chronic kidney disease with heart failure and stage 1 through stage 4 chronic kidney disease, or unspecified chronic kidney disease: Secondary | ICD-10-CM | POA: Diagnosis not present

## 2020-05-26 DIAGNOSIS — Z794 Long term (current) use of insulin: Secondary | ICD-10-CM | POA: Insufficient documentation

## 2020-05-26 DIAGNOSIS — E1122 Type 2 diabetes mellitus with diabetic chronic kidney disease: Secondary | ICD-10-CM | POA: Insufficient documentation

## 2020-05-26 DIAGNOSIS — Z95810 Presence of automatic (implantable) cardiac defibrillator: Secondary | ICD-10-CM | POA: Diagnosis not present

## 2020-05-26 DIAGNOSIS — G9341 Metabolic encephalopathy: Secondary | ICD-10-CM | POA: Diagnosis not present

## 2020-05-26 DIAGNOSIS — I517 Cardiomegaly: Secondary | ICD-10-CM | POA: Diagnosis not present

## 2020-05-26 DIAGNOSIS — N184 Chronic kidney disease, stage 4 (severe): Secondary | ICD-10-CM | POA: Insufficient documentation

## 2020-05-26 DIAGNOSIS — R4182 Altered mental status, unspecified: Secondary | ICD-10-CM | POA: Diagnosis present

## 2020-05-26 DIAGNOSIS — E1165 Type 2 diabetes mellitus with hyperglycemia: Secondary | ICD-10-CM | POA: Diagnosis not present

## 2020-05-26 DIAGNOSIS — E11621 Type 2 diabetes mellitus with foot ulcer: Secondary | ICD-10-CM

## 2020-05-26 DIAGNOSIS — N320 Bladder-neck obstruction: Secondary | ICD-10-CM | POA: Diagnosis present

## 2020-05-26 DIAGNOSIS — R531 Weakness: Secondary | ICD-10-CM | POA: Diagnosis not present

## 2020-05-26 DIAGNOSIS — Z7902 Long term (current) use of antithrombotics/antiplatelets: Secondary | ICD-10-CM | POA: Insufficient documentation

## 2020-05-26 DIAGNOSIS — J9811 Atelectasis: Secondary | ICD-10-CM | POA: Diagnosis not present

## 2020-05-26 DIAGNOSIS — N189 Chronic kidney disease, unspecified: Secondary | ICD-10-CM

## 2020-05-26 DIAGNOSIS — IMO0002 Reserved for concepts with insufficient information to code with codable children: Secondary | ICD-10-CM

## 2020-05-26 DIAGNOSIS — R0689 Other abnormalities of breathing: Secondary | ICD-10-CM | POA: Diagnosis not present

## 2020-05-26 DIAGNOSIS — I129 Hypertensive chronic kidney disease with stage 1 through stage 4 chronic kidney disease, or unspecified chronic kidney disease: Secondary | ICD-10-CM | POA: Diagnosis not present

## 2020-05-26 DIAGNOSIS — R748 Abnormal levels of other serum enzymes: Secondary | ICD-10-CM | POA: Diagnosis not present

## 2020-05-26 DIAGNOSIS — I959 Hypotension, unspecified: Secondary | ICD-10-CM | POA: Diagnosis not present

## 2020-05-26 LAB — URINALYSIS, COMPLETE (UACMP) WITH MICROSCOPIC
Bilirubin Urine: NEGATIVE
Glucose, UA: NEGATIVE mg/dL
Ketones, ur: NEGATIVE mg/dL
Nitrite: NEGATIVE
Protein, ur: 30 mg/dL — AB
Specific Gravity, Urine: 1.01 (ref 1.005–1.030)
pH: 5 (ref 5.0–8.0)

## 2020-05-26 LAB — COMPREHENSIVE METABOLIC PANEL
ALT: 50 U/L — ABNORMAL HIGH (ref 0–44)
AST: 72 U/L — ABNORMAL HIGH (ref 15–41)
Albumin: 2.4 g/dL — ABNORMAL LOW (ref 3.5–5.0)
Alkaline Phosphatase: 90 U/L (ref 38–126)
Anion gap: 9 (ref 5–15)
BUN: 33 mg/dL — ABNORMAL HIGH (ref 8–23)
CO2: 25 mmol/L (ref 22–32)
Calcium: 8 mg/dL — ABNORMAL LOW (ref 8.9–10.3)
Chloride: 111 mmol/L (ref 98–111)
Creatinine, Ser: 2.32 mg/dL — ABNORMAL HIGH (ref 0.61–1.24)
GFR, Estimated: 29 mL/min — ABNORMAL LOW (ref >60)
Glucose, Bld: 121 mg/dL — ABNORMAL HIGH (ref 70–99)
Potassium: 4.2 mmol/L (ref 3.5–5.1)
Sodium: 145 mmol/L (ref 135–145)
Total Bilirubin: 0.5 mg/dL (ref 0.3–1.2)
Total Protein: 6.5 g/dL (ref 6.5–8.1)

## 2020-05-26 LAB — CBC WITH DIFFERENTIAL/PLATELET
Abs Immature Granulocytes: 0.02 10*3/uL (ref 0.00–0.07)
Basophils Absolute: 0 10*3/uL (ref 0.0–0.1)
Basophils Relative: 1 %
Eosinophils Absolute: 0.1 10*3/uL (ref 0.0–0.5)
Eosinophils Relative: 1 %
HCT: 43.2 % (ref 39.0–52.0)
Hemoglobin: 13.3 g/dL (ref 13.0–17.0)
Immature Granulocytes: 0 %
Lymphocytes Relative: 9 %
Lymphs Abs: 0.6 10*3/uL — ABNORMAL LOW (ref 0.7–4.0)
MCH: 28.4 pg (ref 26.0–34.0)
MCHC: 30.8 g/dL (ref 30.0–36.0)
MCV: 92.3 fL (ref 80.0–100.0)
Monocytes Absolute: 0.7 10*3/uL (ref 0.1–1.0)
Monocytes Relative: 10 %
Neutro Abs: 5 10*3/uL (ref 1.7–7.7)
Neutrophils Relative %: 79 %
Platelets: 204 10*3/uL (ref 150–400)
RBC: 4.68 MIL/uL (ref 4.22–5.81)
RDW: 14.4 % (ref 11.5–15.5)
WBC: 6.3 10*3/uL (ref 4.0–10.5)
nRBC: 0 % (ref 0.0–0.2)

## 2020-05-26 LAB — RESPIRATORY PANEL BY PCR

## 2020-05-26 LAB — ETHANOL: Alcohol, Ethyl (B): <10 mg/dL (ref <10)

## 2020-05-26 LAB — TROPONIN I (HIGH SENSITIVITY)
Troponin I (High Sensitivity): 61 ng/L — ABNORMAL HIGH (ref <18)
Troponin I (High Sensitivity): 58 ng/L — ABNORMAL HIGH (ref <18)

## 2020-05-26 LAB — AMMONIA: Ammonia: 31 umol/L (ref 9–35)

## 2020-05-26 LAB — LACTIC ACID, PLASMA: Lactic Acid, Venous: 1.6 mmol/L (ref 0.5–1.9)

## 2020-05-26 LAB — CBG MONITORING, ED: Glucose-Capillary: 115 mg/dL — ABNORMAL HIGH (ref 70–99)

## 2020-05-26 LAB — SARS CORONAVIRUS 2 BY RT PCR (HOSPITAL ORDER, PERFORMED IN ~~LOC~~ HOSPITAL LAB): SARS Coronavirus 2: NEGATIVE

## 2020-05-26 MED ORDER — TAMSULOSIN HCL 0.4 MG PO CAPS
0.4000 mg | ORAL_CAPSULE | Freq: Every day | ORAL | Status: DC
  Administered 2020-05-27: 0.4 mg via ORAL
  Filled 2020-05-26: qty 1

## 2020-05-26 MED ORDER — HEPARIN SODIUM (PORCINE) 5000 UNIT/ML IJ SOLN
5000.0000 [IU] | Freq: Three times a day (TID) | INTRAMUSCULAR | Status: DC
  Administered 2020-05-26 – 2020-05-27 (×2): 5000 [IU] via SUBCUTANEOUS
  Filled 2020-05-26 (×2): qty 1

## 2020-05-26 MED ORDER — ONDANSETRON HCL 4 MG/2ML IJ SOLN: 4.0000 mg | Freq: Four times a day (QID) | INTRAMUSCULAR | Status: DC | PRN

## 2020-05-26 MED ORDER — ACETAMINOPHEN 325 MG PO TABS: 650.0000 mg | ORAL_TABLET | Freq: Four times a day (QID) | ORAL | Status: DC | PRN

## 2020-05-26 MED ORDER — OSELTAMIVIR PHOSPHATE 30 MG PO CAPS
30.0000 mg | ORAL_CAPSULE | Freq: Once | ORAL | Status: AC
  Administered 2020-05-26: 30 mg via ORAL
  Filled 2020-05-26: qty 1

## 2020-05-26 MED ORDER — CARVEDILOL 3.125 MG PO TABS
3.1250 mg | ORAL_TABLET | Freq: Two times a day (BID) | ORAL | Status: DC
  Administered 2020-05-27: 3.125 mg via ORAL
  Filled 2020-05-26: qty 1

## 2020-05-26 MED ORDER — SODIUM CHLORIDE 0.9 % IV SOLN: INTRAVENOUS | Status: AC

## 2020-05-26 MED ORDER — INSULIN ASPART 100 UNIT/ML ~~LOC~~ SOLN: 0.0000 [IU] | Freq: Three times a day (TID) | SUBCUTANEOUS | Status: DC

## 2020-05-26 MED ORDER — MIRTAZAPINE 15 MG PO TABS
15.0000 mg | ORAL_TABLET | Freq: Every day | ORAL | Status: DC
  Administered 2020-05-26: 15 mg via ORAL
  Filled 2020-05-26 (×2): qty 1

## 2020-05-26 MED ORDER — ONDANSETRON HCL 4 MG PO TABS: 4.0000 mg | ORAL_TABLET | Freq: Four times a day (QID) | ORAL | Status: DC | PRN

## 2020-05-26 MED ORDER — AMIODARONE HCL 200 MG PO TABS
200.0000 mg | ORAL_TABLET | Freq: Every day | ORAL | Status: DC
  Administered 2020-05-27: 200 mg via ORAL
  Filled 2020-05-26: qty 1

## 2020-05-26 MED ORDER — ASPIRIN EC 81 MG PO TBEC
81.0000 mg | DELAYED_RELEASE_TABLET | Freq: Every day | ORAL | Status: DC
  Administered 2020-05-27: 81 mg via ORAL
  Filled 2020-05-26 (×2): qty 1

## 2020-05-26 MED ORDER — ACETAMINOPHEN 650 MG RE SUPP: 650.0000 mg | Freq: Four times a day (QID) | RECTAL | Status: DC | PRN

## 2020-05-26 MED ORDER — OSELTAMIVIR PHOSPHATE 30 MG PO CAPS
30.0000 mg | ORAL_CAPSULE | Freq: Every day | ORAL | Status: DC
  Administered 2020-05-27: 30 mg via ORAL
  Filled 2020-05-26: qty 1

## 2020-05-26 MED ORDER — FINASTERIDE 5 MG PO TABS
5.0000 mg | ORAL_TABLET | Freq: Every day | ORAL | Status: DC
  Administered 2020-05-27: 5 mg via ORAL
  Filled 2020-05-26: qty 1

## 2020-05-26 MED ORDER — CLOPIDOGREL BISULFATE 75 MG PO TABS
75.0000 mg | ORAL_TABLET | Freq: Every day | ORAL | Status: DC
  Administered 2020-05-27: 75 mg via ORAL
  Filled 2020-05-26: qty 1

## 2020-05-26 MED ORDER — ROSUVASTATIN CALCIUM 20 MG PO TABS
40.0000 mg | ORAL_TABLET | Freq: Every day | ORAL | Status: DC
  Administered 2020-05-27: 40 mg via ORAL
  Filled 2020-05-26: qty 2

## 2020-05-26 NOTE — ED Triage Notes (Signed)
Pt from home via EMS- family reports weakness and ams by patient x 3-4 days. Pt A/O x 1, whereas normally is A/O x 4. Pt is paraplegic d/t being dropped by staff at a SNF.

## 2020-05-26 NOTE — ED Notes (Signed)
Dr. patel at bedside
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
## 2020-05-26 NOTE — ED Notes (Signed)
Troponin processing and waiting to result per main lab.
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
## 2020-05-27 DIAGNOSIS — I2510 Atherosclerotic heart disease of native coronary artery without angina pectoris: Secondary | ICD-10-CM

## 2020-05-27 DIAGNOSIS — G9341 Metabolic encephalopathy: Secondary | ICD-10-CM | POA: Diagnosis not present

## 2020-05-27 DIAGNOSIS — I130 Hypertensive heart and chronic kidney disease with heart failure and stage 1 through stage 4 chronic kidney disease, or unspecified chronic kidney disease: Secondary | ICD-10-CM | POA: Diagnosis not present

## 2020-05-27 DIAGNOSIS — I5042 Chronic combined systolic (congestive) and diastolic (congestive) heart failure: Secondary | ICD-10-CM | POA: Diagnosis not present

## 2020-05-27 DIAGNOSIS — N320 Bladder-neck obstruction: Secondary | ICD-10-CM | POA: Diagnosis not present

## 2020-05-27 DIAGNOSIS — Z794 Long term (current) use of insulin: Secondary | ICD-10-CM

## 2020-05-27 DIAGNOSIS — E1121 Type 2 diabetes mellitus with diabetic nephropathy: Secondary | ICD-10-CM

## 2020-05-27 DIAGNOSIS — R778 Other specified abnormalities of plasma proteins: Secondary | ICD-10-CM

## 2020-05-27 DIAGNOSIS — N189 Chronic kidney disease, unspecified: Secondary | ICD-10-CM

## 2020-05-27 DIAGNOSIS — N179 Acute kidney failure, unspecified: Secondary | ICD-10-CM | POA: Diagnosis not present

## 2020-05-27 DIAGNOSIS — R5381 Other malaise: Secondary | ICD-10-CM | POA: Diagnosis not present

## 2020-05-27 DIAGNOSIS — J101 Influenza due to other identified influenza virus with other respiratory manifestations: Secondary | ICD-10-CM | POA: Diagnosis not present

## 2020-05-27 DIAGNOSIS — R748 Abnormal levels of other serum enzymes: Secondary | ICD-10-CM

## 2020-05-27 LAB — COMPREHENSIVE METABOLIC PANEL
ALT: 43 U/L (ref 0–44)
AST: 56 U/L — ABNORMAL HIGH (ref 15–41)
Albumin: 2.4 g/dL — ABNORMAL LOW (ref 3.5–5.0)
Alkaline Phosphatase: 89 U/L (ref 38–126)
Anion gap: 10 (ref 5–15)
BUN: 32 mg/dL — ABNORMAL HIGH (ref 8–23)
CO2: 25 mmol/L (ref 22–32)
Calcium: 8.1 mg/dL — ABNORMAL LOW (ref 8.9–10.3)
Chloride: 110 mmol/L (ref 98–111)
Creatinine, Ser: 2.34 mg/dL — ABNORMAL HIGH (ref 0.61–1.24)
GFR, Estimated: 29 mL/min — ABNORMAL LOW (ref >60)
Glucose, Bld: 146 mg/dL — ABNORMAL HIGH (ref 70–99)
Potassium: 4.5 mmol/L (ref 3.5–5.1)
Sodium: 145 mmol/L (ref 135–145)
Total Bilirubin: 0.5 mg/dL (ref 0.3–1.2)
Total Protein: 6.7 g/dL (ref 6.5–8.1)

## 2020-05-27 LAB — CBC
HCT: 43.1 % (ref 39.0–52.0)
Hemoglobin: 12.7 g/dL — ABNORMAL LOW (ref 13.0–17.0)
MCH: 27.5 pg (ref 26.0–34.0)
MCHC: 29.5 g/dL — ABNORMAL LOW (ref 30.0–36.0)
MCV: 93.5 fL (ref 80.0–100.0)
Platelets: 185 10*3/uL (ref 150–400)
RBC: 4.61 MIL/uL (ref 4.22–5.81)
RDW: 14.3 % (ref 11.5–15.5)
WBC: 7.4 10*3/uL (ref 4.0–10.5)
nRBC: 0 % (ref 0.0–0.2)

## 2020-05-27 LAB — HEMOGLOBIN A1C
Hgb A1c MFr Bld: 6.2 % — ABNORMAL HIGH (ref 4.8–5.6)
Mean Plasma Glucose: 131.24 mg/dL

## 2020-05-27 LAB — CBG MONITORING, ED
Glucose-Capillary: 121 mg/dL — ABNORMAL HIGH (ref 70–99)
Glucose-Capillary: 120 mg/dL — ABNORMAL HIGH (ref 70–99)

## 2020-05-27 MED ORDER — LOSARTAN POTASSIUM 25 MG PO TABS: 12.5000 mg | ORAL_TABLET | Freq: Two times a day (BID) | ORAL | Status: DC

## 2020-05-27 MED ORDER — TORSEMIDE 20 MG PO TABS: 20.0000 mg | ORAL_TABLET | ORAL | Status: DC

## 2020-05-27 MED ORDER — OSELTAMIVIR PHOSPHATE 30 MG PO CAPS: 30.0000 mg | ORAL_CAPSULE | Freq: Every day | ORAL | 0 refills | Status: DC

## 2020-05-27 NOTE — ED Notes (Signed)
MS Breakfast Ordered 

## 2020-05-27 NOTE — Discharge Summary (Signed)
Physician Discharge Summary  [name] [name] IZT:[id number] DOB: April 06, 1946 DOA: 05/26/2020  PCP: [name], [name] G, MD  Admit date: 05/26/2020 Discharge date: 05/27/2020  Admitted From: Home Disposition: Home  Recommendations for Outpatient Follow-up:  1. Follow ups as below. 2. Please obtain CBC/CMP/Mag at follow up 3. Please follow up on the following pending results: None  Home Health: None Equipment/Devices: Patient has appropriate DME's  Discharge Condition: Stable CODE STATUS: Full code   Follow-up Information    [name], [name] G, MD. Schedule an appointment as soon as possible for a visit in 1 week(s).   Specialty: Family Medicine Contact information: [place] [address] [phone]              Hospital Course: 75 year old male with history of combined CHF/ICM (EF 15-20%), multivessel CAD, NSVT s/p AICD, CVA, CKD-4, DM-2, HTN, neurogenic bladder, chronic indwelling Foley, RLE paralysis and wheelchair-bound brought to ED for evaluation of fever, nausea, vomiting, diarrhea, cough and altered mental status, and found to have influenza A infection and mild AKI.  He was hemodynamically stable and saturating in mid 90s on RA. Cr 2.32 (1.9 in 05/2019).  AST 72.  ALT 50.  CBC without significant finding.  Ammonia 31.  Lactic acid 1.6.  High-sensitivity troponin 61.  UA with many bacteria but obtained from chronic Foley.  COVID-19 PCR negative.  Influenza A positive.  CXR without acute finding.  Started on renally dosed oseltamivir, and admitted for overnight observation.  The next day, symptoms resolved.  He is awake, alert and oriented x4.  No further cardiopulmonary or GI symptoms.  He feels he is back to baseline and ready to go home.  Discharged on oseltamivir 30 mg daily for 4 more days.  Discussed plan with patient's daughter, [name] [name] over the phone.  Recommended holding his losartan and torsemide for 4 more days.  Also recommended follow-up with his PCP  and cardiology in 1 to 2 weeks.  Discharge Diagnoses:  Influenza A: Symptoms improved. -Discharge on oseltamavir 30 mg daily for 4 more days. -Recommended holding losartan and torsemide for days  AKI on CKD-IV: Could be progressive CKD. Recent Labs    06/15/19 2212 06/15/19 2224 05/26/20 1622 05/27/20 0246  BUN 23 31* 33* 32*  CREATININE 2.08* 1.90* 2.32* 2.34*  -Recommend holding home losartan torsemide for 4 days -Repeat BMP in 1 week -Treat influenza as above  Acute metabolic encephalopathy: this could be due to influenza and dehydration.  He is also on Norco and mirtazapine which could potentially contribute.  Encephalopathy resolved.  Oriented x4 at time of discharge. -I recommend reviewing his medications at follow-up.  Chronic combined systolic and diastolic CHF: LVEF of 15 to 20% by TEE in 12/2016.  Appears euvolemic.  May be dehydrated prior to arrival. -Recommended holding losartan torsemide for 4 days in the setting of influenza. -Continue his low-dose Coreg. -Outpatient follow-up with cardiologist.  History of ventricular tachycardia s/p ICD: Stable. -Continue amiodarone.  Mildly elevated troponin/history of multivessel CAD s/p DES-troponin elevation likely a combination of demand ischemia in the setting of dehydration and delayed clearance due to poor kidney function.  No chest pain. -Continue home Coreg, aspirin, Plavix, rosuvastatin.  Mildly elevated LFTs: Likely due to influenza.  He is also on amiodarone. -Recheck at follow-up  Neurogenic bladder/bladder outlet obstruction with chronic indwelling Foley catheter: -Continue finasteride, Flomax, and Foley care.  Controlled IDDM-2 with microvascular complication: F7T 0.2%.  Recent Labs  Lab 05/26/20 1621 05/27/20 0240 05/27/20  [facility] home medications  Debility-wheelchair-bound at baseline.  There is no height or weight on file to calculate BMI.           Discharge Exam: Vitals:   05/27/20 0600 05/27/20 0930  BP: 127/68 119/75  Pulse: 87 99  Resp: 18 20  Temp: 97.6 F (36.4 C)   SpO2: 98% 92%    GENERAL: No apparent distress.  Nontoxic. HEENT: MMM.  Vision and hearing grossly intact.  NECK: Supple.  No apparent JVD.  RESP: On room air.  No IWOB.  Fair aeration bilaterally. CVS:  RRR. Heart sounds normal.  ABD/GI/GU: Bowel sounds present. Soft. Non tender.  Chronic indwelling Foley. MSK/EXT:  Moves extremities. No apparent deformity. No edema.  SKIN: no apparent skin lesion or wound NEURO: Awake, alert and oriented x4.  No apparent focal neuro deficit. PSYCH: Calm. Normal affect.  Discharge Instructions  Discharge Instructions    (HEART FAILURE PATIENTS) Call MD:  Anytime you have any of the following symptoms: 1) 3 pound weight gain in 24 hours or 5 pounds in 1 week 2) shortness of breath, with or without a dry hacking cough 3) swelling in the hands, feet or stomach 4) if you have to sleep on extra pillows at night in order to breathe.   Complete by: As directed    Call MD for:  difficulty breathing, headache or visual disturbances   Complete by: As directed    Call MD for:  extreme fatigue   Complete by: As directed    Call MD for:  persistant dizziness or light-headedness   Complete by: As directed    Call MD for:  persistant nausea and vomiting   Complete by: As directed    Diet - low sodium heart healthy   Complete by: As directed    Diet Carb Modified   Complete by: As directed    Discharge instructions   Complete by: As directed    It has been a pleasure taking care of you!  You were hospitalized due to fever, nausea, vomiting, diarrhea, cough and altered mental status likely due to influenza infection.  You have been started on Tamiflu (oseltamivir) for influenza.  Your symptoms improved to the point we think it is safe to let you go home and follow-up with your doctors.  We recommend you hold your losartan  and torsemide until 05/31/20.  Follow-up with your primary care doctor and cardiologist in 1 to 2 weeks.   Take care,   Increase activity slowly   Complete by: As directed      Allergies as of 05/27/2020      Reactions   Tape Other (See Comments)   SKIN IS VERY FRAGILE- WILL TEAR AND BRUISE EASILY!!   Sulfa Antibiotics Rash      Medication List    TAKE these medications   amiodarone 200 MG tablet Commonly known as: PACERONE TAKE 1 TABLET EVERY DAY   aspirin EC 81 MG tablet Take 81 mg by mouth daily.   B-D SINGLE USE SWABS REGULAR Pads USE TO TEST BLOOD SUGAR DAILY What changed: See the new instructions.   Blood Pressure Monitor Automat Devi To check BP daily   carvedilol 3.125 MG tablet Commonly known as: COREG Take 1 tablet (3.125 mg total) by mouth 2 (two) times daily with a meal. Needs appt for future refills What changed: additional instructions   clopidogrel 75 MG tablet Commonly known as: PLAVIX TAKE 1 TABLET EVERY  DAY   clotrimazole-betamethasone cream Commonly known as: LOTRISONE Apply 1 application topically daily as needed. mall amount. What changed:   reasons to take this  additional instructions   finasteride 5 MG tablet Commonly known as: PROSCAR TAKE 1 TABLET EVERY DAY   HYDROcodone-acetaminophen 5-325 MG tablet Commonly known as: NORCO/VICODIN Take 1 tablet by mouth every 6 (six) hours as needed for moderate pain.   insulin aspart 100 UNIT/ML injection Commonly known as: NovoLOG Inject 10 Units into the skin 3 (three) times daily with meals. You can add extra insulin for sugars over 150 to the mealtime dose based on the sugar before the meal: Add 1 unit of NovoLog if your sugar is 150-200 Add 3 units of NovoLog if your sugar is 201-250 Add 5 units of NovoLog if your sugar is 251-300 Add 7 units of NovoLog if your sugar is over 301 What changed:   when to take this  additional instructions   Lantus SoloStar 100 UNIT/ML Solostar Pen Generic  drug: insulin glargine Inject 35 Units into the skin at bedtime.   losartan 25 MG tablet Commonly known as: COZAAR Take 0.5 tablets (12.5 mg total) by mouth in the morning and at bedtime. Start taking on: May 31, 2020 What changed:   when to take this  These instructions start on May 31, 2020. If you are unsure what to do until then, ask your doctor or other care provider.   Magnesium 500 MG Tabs Take 500 mg by mouth every other day.   mirtazapine 15 MG tablet Commonly known as: REMERON TAKE 1 TABLET (15 MG TOTAL) BY MOUTH AT BEDTIME.   nystatin powder Commonly known as: MYCOSTATIN/NYSTOP Apply topically 3 (three) times daily. What changed:   how much to take  when to take this  reasons to take this   One-A-Day Proactive 65+ Tabs Take 1 tablet by mouth daily with breakfast.   oseltamivir 30 MG capsule Commonly known as: TAMIFLU Take 1 capsule (30 mg total) by mouth daily. Start taking on: May 28, 2020   rosuvastatin 40 MG tablet Commonly known as: CRESTOR TAKE 1 TABLET EVERY DAY   Santyl ointment Generic drug: collagenase Apply 1 application topically as needed (for wound care).   tamsulosin 0.4 MG Caps capsule Commonly known as: FLOMAX TAKE 1 CAPSULE EVERY DAY   torsemide 20 MG tablet Commonly known as: DEMADEX Take 1 tablet (20 mg total) by mouth every other day. Start taking on: May 31, 2020 What changed: These instructions start on May 31, 2020. If you are unsure what to do until then, ask your doctor or other care provider.   True Metrix Air Glucose Meter w/Device Kit 1 Device by Does not apply route daily.   True Metrix Blood Glucose Test test strip Generic drug: glucose blood USE  AS  INSTRUCTED   True Metrix Level 1 Low Soln USE TO TEST BLOOD SUGAR DAILY   TRUEplus Lancets 30G Misc USE  TO  TEST BLOOD SUGAR EVERY DAY What changed: See the new instructions.   Vitamin A 2400 MCG (8000 UT) Caps Take 2,400 mcg by mouth  daily.   vitamin C 500 MG tablet Commonly known as: ASCORBIC ACID Take 500 mg by mouth daily.       Consultations:  None  Procedures/Studies:   DG Chest Port 1 View  Result Date: 05/26/2020 CLINICAL DATA:  Weakness and altered level of consciousness. EXAM: PORTABLE CHEST 1 VIEW COMPARISON:  Radiograph 06/15/2019 FINDINGS: Patient is rotated. Stable left-sided  pacemaker/ICD. Lung volumes are low. Cardiomegaly is not significantly changed. Grossly stable mediastinal contours allowing for differences in technique. Streaky opacity at the left lung base favors atelectasis. Possible trace right pleural effusion with blunting of the costophrenic angle. No pneumothorax. Bronchovascular crowding without pulmonary edema. No acute osseous abnormalities are seen. IMPRESSION: 1. Low lung volumes with left basilar atelectasis. 2. Stable cardiomegaly. Possible trace right pleural effusion. Electronically Signed   By: Keith Rake M.D.   On: 05/26/2020 16:37   VAS [place] ABI WITH/WO TBI  Result Date: 05/17/2020 LOWER EXTREMITY DOPPLER STUDY Indications: Ulceration, and peripheral artery disease. High Risk Factors: Hypertension, hyperlipidemia, Diabetes, coronary artery                    disease, prior CVA.  Comparison Study: 10/14/2018: Rt ABI 0.93, Lt ABI 0.84 Performing Technologist: Ivan Croft  Examination Guidelines: A complete evaluation includes at minimum, Doppler waveform signals and systolic blood pressure reading at the level of bilateral brachial, anterior tibial, and posterior tibial arteries, when vessel segments are accessible. Bilateral testing is considered an integral part of a complete examination. Photoelectric Plethysmograph (PPG) waveforms and toe systolic pressure readings are included as required and additional duplex testing as needed. Limited examinations for reoccurring indications may be performed as noted.  ABI Findings: +---------+------------------+-----+----------+--------+  Right    Rt Pressure (mmHg)IndexWaveform  Comment  +---------+------------------+-----+----------+--------+ Brachial 114                                       +---------+------------------+-----+----------+--------+ PTA      105               0.89 monophasic         +---------+------------------+-----+----------+--------+ DP       92                0.78 monophasic         +---------+------------------+-----+----------+--------+ Great Toe57                0.48                    +---------+------------------+-----+----------+--------+ +---------+------------------+-----+----------+-------+ Left     Lt Pressure (mmHg)IndexWaveform  Comment +---------+------------------+-----+----------+-------+ Brachial 118                                      +---------+------------------+-----+----------+-------+ PTA      99                0.84 biphasic          +---------+------------------+-----+----------+-------+ DP       108               0.92 monophasic        +---------+------------------+-----+----------+-------+ Great Toe50                0.42                   +---------+------------------+-----+----------+-------+ +-------+-----------+-----------+------------+------------+ ABI/TBIToday's ABIToday's TBIPrevious ABIPrevious TBI +-------+-----------+-----------+------------+------------+ Right  0.89       0.48       0.93        0.89         +-------+-----------+-----------+------------+------------+ Left   0.92       0.42       0.84  0.97         +-------+-----------+-----------+------------+------------+  Bilateral TBIs appear decreased compared to prior study on 10/14/2018.  Summary: Right: Resting right ankle-brachial index indicates mild right lower extremity arterial disease. The right toe-brachial index is abnormal. Left: Resting left ankle-brachial index indicates mild left lower extremity arterial disease. The left toe-brachial index  is abnormal.  *See table(s) above for measurements and observations.  Electronically signed by Monica Martinez MD on 05/17/2020 at 11:45:49 AM.    Final         The results of significant diagnostics from this hospitalization (including imaging, microbiology, ancillary and laboratory) are listed below for reference.     Microbiology: Recent Results (from the past 240 hour(s))  SARS Coronavirus 2 by RT PCR (hospital order, performed in [facility] hospital lab) Nasopharyngeal Nasopharyngeal Swab     Status: None   Collection Time: 05/26/20  4:22 PM   Specimen: Nasopharyngeal Swab  Result Value Ref Range Status   SARS Coronavirus 2 NEGATIVE NEGATIVE Final    Comment: (NOTE) SARS-CoV-2 target nucleic acids are NOT DETECTED.  The SARS-CoV-2 RNA is generally detectable in upper and lower respiratory specimens during the acute phase of infection. The lowest concentration of SARS-CoV-2 viral copies this assay can detect is 250 copies / mL. A negative result does not preclude SARS-CoV-2 infection and should not be used as the sole basis for treatment or other patient management decisions.  A negative result may occur with improper specimen collection / handling, submission of specimen other than nasopharyngeal swab, presence of viral mutation(s) within the areas targeted by this assay, and inadequate number of viral copies (<250 copies / mL). A negative result must be combined with clinical observations, patient history, and epidemiological information.  Fact Sheet for Patients:   StrictlyIdeas.no  Fact Sheet for Healthcare Providers: BankingDealers.co.za  This test is not yet approved or  cleared by the [place] FDA and has been authorized for detection and/or diagnosis of SARS-CoV-2 by FDA under an Emergency Use Authorization (EUA).  This EUA will remain in effect (meaning this test can be used) for the duration of the COVID-19  declaration under Section 564(b)(1) of the Act, 21 U.S.C. section 360bbb-3(b)(1), unless the authorization is terminated or revoked sooner.  Performed at [facility], [place] [address] [place], [place] [address]   Respiratory (~20 pathogens) panel by PCR     Status: Abnormal   Collection Time: 05/26/20  6:40 PM   Specimen: Nasopharyngeal Swab; Respiratory  Result Value Ref Range Status   Adenovirus NOT DETECTED NOT DETECTED Final   Coronavirus 229E NOT DETECTED NOT DETECTED Final    Comment: (NOTE) The Coronavirus on the Respiratory Panel, DOES NOT test for the novel  Coronavirus (2019 nCoV)    Coronavirus HKU1 NOT DETECTED NOT DETECTED Final   Coronavirus NL63 NOT DETECTED NOT DETECTED Final   Coronavirus OC43 NOT DETECTED NOT DETECTED Final   Metapneumovirus NOT DETECTED NOT DETECTED Final   Rhinovirus / Enterovirus NOT DETECTED NOT DETECTED Final   Influenza A H3 DETECTED (A) NOT DETECTED Final   Influenza B NOT DETECTED NOT DETECTED Final   Parainfluenza Virus 1 NOT DETECTED NOT DETECTED Final   Parainfluenza Virus 2 NOT DETECTED NOT DETECTED Final   Parainfluenza Virus 3 NOT DETECTED NOT DETECTED Final   Parainfluenza Virus 4 NOT DETECTED NOT DETECTED Final   Respiratory Syncytial Virus NOT DETECTED NOT DETECTED Final   Bordetella pertussis NOT DETECTED NOT DETECTED Final   Bordetella Parapertussis  NOT DETECTED NOT DETECTED Final   Chlamydophila pneumoniae NOT DETECTED NOT DETECTED Final   Mycoplasma pneumoniae NOT DETECTED NOT DETECTED Final    Comment: Performed at [facility], [place] [address], [place], [place] [address]     Labs:  CBC: Recent Labs  Lab 05/26/20 1622 05/27/20 0246  WBC 6.3 7.4  NEUTROABS 5.0  --   HGB 13.3 12.7*  HCT 43.2 43.1  MCV 92.3 93.5  PLT 204 185   BMP &GFR Recent Labs  Lab 05/26/20 1622 05/27/20 0246  NA 145 145  K 4.2 4.5  CL 111 110  CO2 25 25  GLUCOSE 121* 146*  BUN 33* 32*  CREATININE 2.32* 2.34*  CALCIUM  8.0* 8.1*   CrCl cannot be calculated (Unknown ideal weight.). Liver & Pancreas: Recent Labs  Lab 05/26/20 1622 05/27/20 0246  AST 72* 56*  ALT 50* 43  ALKPHOS 90 89  BILITOT 0.5 0.5  PROT 6.5 6.7  ALBUMIN 2.4* 2.4*   No results for input(s): LIPASE, AMYLASE in the last 168 hours. Recent Labs  Lab 05/26/20 1622  AMMONIA 31   Diabetic: Recent Labs    05/27/20 0246  HGBA1C 6.2*   Recent Labs  Lab 05/26/20 1621 05/27/20 0240 05/27/20 0754  GLUCAP 115* 121* 120*   Cardiac Enzymes: No results for input(s): CKTOTAL, CKMB, CKMBINDEX, TROPONINI in the last 168 hours. No results for input(s): PROBNP in the last 8760 hours. Coagulation Profile: No results for input(s): INR, PROTIME in the last 168 hours. Thyroid Function Tests: No results for input(s): TSH, T4TOTAL, FREET4, T3FREE, THYROIDAB in the last 72 hours. Lipid Profile: No results for input(s): CHOL, HDL, LDLCALC, TRIG, CHOLHDL, LDLDIRECT in the last 72 hours. Anemia Panel: No results for input(s): VITAMINB12, FOLATE, FERRITIN, TIBC, IRON, RETICCTPCT in the last 72 hours. Urine analysis:    Component Value Date/Time   COLORURINE YELLOW 05/26/2020 1622   APPEARANCEUR HAZY (A) 05/26/2020 1622   LABSPEC 1.010 05/26/2020 1622   PHURINE 5.0 05/26/2020 1622   GLUCOSEU NEGATIVE 05/26/2020 1622   HGBUR MODERATE (A) 05/26/2020 1622   BILIRUBINUR NEGATIVE 05/26/2020 1622   BILIRUBINUR negative 11/28/2015 1442   KETONESUR NEGATIVE 05/26/2020 1622   PROTEINUR 30 (A) 05/26/2020 1622   UROBILINOGEN negative 11/28/2015 1442   UROBILINOGEN 0.2 09/06/2011 0908   NITRITE NEGATIVE 05/26/2020 1622   LEUKOCYTESUR LARGE (A) 05/26/2020 1622   Sepsis Labs: Invalid input(s): PROCALCITONIN, LACTICIDVEN   Time coordinating discharge: 35 minutes  SIGNED:  Mercy Riding, MD  [facility] 05/27/2020, 10:17 AM  If 7PM-7AM, please contact night-coverage [email or web address]

## 2020-05-27 NOTE — ED Notes (Signed)
Pt has pulled out both IVs in attempt to climb out of bed, and tele cords, this rn attempted reorientation and education, pt reminded he is paraplegic. Reports understanding but pt still appears to be confused. Pt repositioned in bed, bed cleaned of blood, dressings applied, bed locked in lowest position, VSS on monitor, nadn. Call bell in reach.

## 2020-05-27 NOTE — ED Notes (Addendum)
Pt pulling of monitoring and gown. Restless in bed Respirations are even and non-labored  Denies pain   Pulled off gauze from blood draw, blood cleaned from pt arm and gown changed

## 2020-05-28 LAB — URINE CULTURE

## 2020-05-30 ENCOUNTER — Other Ambulatory Visit (HOSPITAL_COMMUNITY): Payer: Self-pay | Admitting: Internal Medicine

## 2020-05-31 ENCOUNTER — Other Ambulatory Visit: Payer: Self-pay

## 2020-05-31 ENCOUNTER — Encounter (HOSPITAL_BASED_OUTPATIENT_CLINIC_OR_DEPARTMENT_OTHER): Payer: Medicare HMO | Attending: Internal Medicine | Admitting: Internal Medicine

## 2020-05-31 DIAGNOSIS — L97321 Non-pressure chronic ulcer of left ankle limited to breakdown of skin: Secondary | ICD-10-CM | POA: Insufficient documentation

## 2020-05-31 DIAGNOSIS — I252 Old myocardial infarction: Secondary | ICD-10-CM | POA: Diagnosis not present

## 2020-05-31 DIAGNOSIS — L97521 Non-pressure chronic ulcer of other part of left foot limited to breakdown of skin: Secondary | ICD-10-CM | POA: Diagnosis not present

## 2020-05-31 DIAGNOSIS — E11621 Type 2 diabetes mellitus with foot ulcer: Secondary | ICD-10-CM | POA: Insufficient documentation

## 2020-05-31 DIAGNOSIS — L97211 Non-pressure chronic ulcer of right calf limited to breakdown of skin: Secondary | ICD-10-CM | POA: Diagnosis not present

## 2020-05-31 DIAGNOSIS — I130 Hypertensive heart and chronic kidney disease with heart failure and stage 1 through stage 4 chronic kidney disease, or unspecified chronic kidney disease: Secondary | ICD-10-CM | POA: Diagnosis not present

## 2020-05-31 DIAGNOSIS — L97221 Non-pressure chronic ulcer of left calf limited to breakdown of skin: Secondary | ICD-10-CM | POA: Insufficient documentation

## 2020-05-31 DIAGNOSIS — E1165 Type 2 diabetes mellitus with hyperglycemia: Secondary | ICD-10-CM | POA: Diagnosis not present

## 2020-05-31 DIAGNOSIS — L97518 Non-pressure chronic ulcer of other part of right foot with other specified severity: Secondary | ICD-10-CM | POA: Insufficient documentation

## 2020-05-31 DIAGNOSIS — L89613 Pressure ulcer of right heel, stage 3: Secondary | ICD-10-CM | POA: Insufficient documentation

## 2020-05-31 DIAGNOSIS — I5040 Unspecified combined systolic (congestive) and diastolic (congestive) heart failure: Secondary | ICD-10-CM | POA: Diagnosis not present

## 2020-05-31 DIAGNOSIS — L97522 Non-pressure chronic ulcer of other part of left foot with fat layer exposed: Secondary | ICD-10-CM | POA: Diagnosis not present

## 2020-05-31 DIAGNOSIS — N183 Chronic kidney disease, stage 3 unspecified: Secondary | ICD-10-CM | POA: Insufficient documentation

## 2020-05-31 DIAGNOSIS — Z95810 Presence of automatic (implantable) cardiac defibrillator: Secondary | ICD-10-CM | POA: Diagnosis not present

## 2020-05-31 DIAGNOSIS — E11622 Type 2 diabetes mellitus with other skin ulcer: Secondary | ICD-10-CM | POA: Diagnosis not present

## 2020-05-31 DIAGNOSIS — E1122 Type 2 diabetes mellitus with diabetic chronic kidney disease: Secondary | ICD-10-CM | POA: Insufficient documentation

## 2020-05-31 DIAGNOSIS — L97822 Non-pressure chronic ulcer of other part of left lower leg with fat layer exposed: Secondary | ICD-10-CM | POA: Diagnosis not present

## 2020-05-31 NOTE — Progress Notes (Signed)
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 arterial disease. Probably not contributing any major way to the difficulty healing these wounds 7/14; the patient's wound on the left lateral calf perhaps slightly improved although he once again has this rectangular area of erythema. The cause behind this is unclear. He has another area on the dorsal left foot again with erythema around this. This does not look like cellulitis is and not tender. Not have an arterial issue 7/28; I do not see much change in the wounds at all left lateral calf about the same. The dorsal ankle area which may have been wrap injury necrotic debris over the surface. He has a small area draining on the anterior calf tibial area. 8/18; absolutely no improvement in either area. Left lateral calf along the lateral part of the tibia slightly longer and slightly narrower. He now has exposed tendon in the dorsal foot which may have initially been a wrap injury. He does not have obvious arterial insufficiency. I reviewed his arterial studies today these showed some tibial vessel disease however ABIs and TBI's were normal. He has [employer] we have not  been able to get advanced treatment products 9/15; 1 month follow-up. There is improvement in the left lateral calf along the lateral part of the tibia. This appears to be largely epithelialized some mild erythema around this but no pain He has a new area in the left mid tibia which is a small wound he says was there last time but I do not know that we do find this may have been eschared over scab. He has a paronychia I on the base of the left first toe with erythema. This is not painful but he is reasonably insensate Finally the area on the left dorsal ankle is mostly exposed tendon which ultimately going to need to be removed. We have been using silver collagen in all wound areas 10/6; 3-week follow-up. There is been absolutely no improvement in the left lateral calf. This is  a major deterioration from when he was here 3 weeks ago. In addition he has 2 superficial areas one above this and one below. The area over the dorsal foot has necrotic tendon which I removed. 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 I did not think at that time that that anything to do with the upper left tibial wound. 10/27; this is a 3-week follow-up. The left lateral calf actually looks some better. Still open but generally smaller with a healthy looking surface. I believe I gave him a course of antibiotics when he was here last time. -He also has the area on the dorsal ankle. Again an extensive debridement required here today Finally has a new wound on the left great toe dorsally. He is not precisely aware when this occurred. Using Hydrofera Blue on all wound area 11/17; 3-week follow-up. Left lateral calf certainly not any smaller. May be somewhat larger. He has a large area on the dorsal ankle which I thought was an initial wrap injury covered in necrotic  debris also an area on the left great toe dorsally 12/8; 3-week follow-up. Left lateral calf perhaps somewhat less wide he has 2 areas here which concerned me. He has the area on the left dorsal foot/ankle necrotic debris over the surface and then an area on the left great toe. He comes in this week with excoriations right down his anterior tibia. He is unaware of how this happened. Some erythema around the left lateral calf although no evidence of cellulitis He had arterial studies that suggested only mild arterial disease. I am not sure whether he has had reflux studies or not. I will need to research this 04/28/2019; patient comes in today with improved areas on the left calf and the dorsal left foot/ankle. However he has a reopening on the right lateral calcaneus. 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 He is mystified about how this would have happened although looking at how he lies in the examining table is exactly how this would have happened although he claims he uses bunny boots at night etc. We have been using Sorbact to all wound areas. He had a significant excoriated rash on his left leg when he was here last time. We changed from Kerlix to cotton under the Coban and this seems to have helped 2/2; 1 month follow-up. The patient has open areas on the left lateral tibial area mid lower leg, left dorsal foot/ankle and an area on the right lateral calcaneus. The right lateral calcaneus is better as is the left dorsal foot/ankle. The area on the left lateral tibia however is larger. 3/9; 1 month follow-up. The left lateral tibia looks mostly the same. Left dorsal foot and ankle improved and the area on the right lateral calcaneus is quite a bit worse. He has had previous arterial studies last year that were fairly normal. I continue to think that these are pressure areas. We have  been using silver alginate on all the wounds 3/30; 3-week follow-up. The left lateral tibia is still open but with less width. Left dorsal foot small punched out wound. He has a large area on his right lateral calcaneus and an area that I did not pick up on last time  on the tip of the left calcaneus As well he has a new area on the right lateral calf which was new today. 4/13; this is a patient with multiple medical problems. He has been in this clinic for a very prolonged period of time with different wounds in different areas in his lower extremities. Currently we have the left lateral tibia, left heel, left dorsal foot, right lateral heel and right lateral lower tibia. We have been using Santyl on the right and silver alginate on the left He has his daughter who changes his dressings. The reason behind the nonhealing here and ulcer recurrence is really never been clear to me. The patient is debilitated. He uses bunny boots apparently at night to protect his heels and feet. His arterial studies have previously not suggested a primary arterial etiology 4/27 2-week follow-up. He has a new wound on the right anterior patella currently trauma against the wheelchair. Failing this had he has areas on the right lateral calf and right lateral calcaneus both of which we have been using Santyl. On the left side one of the original wounds on the left lateral tibia area, left calcaneus, punched out areas on the left dorsal foot. On the left we have been using PolyMem Ag 5/11; 2-week follow-up. He does not have any new wounds this week and most of his wounds look better. Using Santyl on the right lateral heel and right lateral ankle and polymen Ag and everything else under compression. 09/15/19-Patient at 2-week follow-up has to the wounds about the same and the rest of the wounds smaller in dimensions, the left posterior wound is about the same, one of the wounds is healed on the right knee. 6/8; 2-week  follow-up. His left lateral calf wound is now split into 2. He has an area on the left heel which is a pressure ulcer which I think is improving he also had the wrap injury areas on the crease of his left ankle/foot that are better. Still an area on the right heel that is also improved we have been using PolyMem Ag 6/22; 2-week follow-up. The left lateral calf wound measures about the same. Left heel is closed Right heel still open but looks better. Assorted wounds on the dorsal foot and ankle 1 of these requires debridement small wound with some depth. We have been using PolyMem Ag to all wound areas 7/13; Left heel is closed Right heel is open but looks smaller Small area on the right lateral calf looks smaller [place] of wounds on the dorsal left foot and ankle. All of these are superficial The substantial area on the left lateral mid tibia. Necrotic debris on the surface. I really do not completely understand this wound. Not making any progress. We have been using PolyMem Ag to all the wounds 8/10-Patient returns at 1 month, the left dorsal foot and ankle wounds are about the same, the left lateral wound on the leg definitely bigger, using PolyMem to all the wounds except the left lateral leg wound where Iodoflex is being tried. Overall these wounds seem to fluctuate in the progress with no clear resolution 8/31; I have not seen this man in over a month and a half. His area on the left leg is much more substantial necrotic surface much longer. Our staff noted purulent drainage. He still has breakdown in the left foot and ankle I am not quite sure how to explain this. On the right side his right lateral heel is just about closed  closed. Small area on the right lateral calf. His left heel is also closed 9/13; 2-week follow-up. Because of the increase in size and the condition of the wound on the left lateral leg I did a PCR culture on this/deep tissue culture last time this showed  methicillin resistant staph aureus and group B strep. I use clindamycin, the patient is allergic to Septra and there was a tetracycline resistant gene detected as well 9/27; 2-week follow-up. Left lateral leg looks stable to improved slightly smaller. He has a scattering of areas on his left dorsal foot but nothing on the left heel. On the right heel a small open area on the lateral aspect and also on the lateral part of his lower leg. We have been using silver alginate under compression As I have often done in this man I feel no reviewed his arterial studies which were last looked at in June 2020 at that point he had an ABI in the right of 0.93 monophasic waveforms however but the great toe pressure was 110 with a TBI of 0.89 on the left his ABI was 0.84 with a great toe pressure of 120 and a TBI of 0.97. This was indicative of mild arterial disease 10/26; the patient arrives with the wounds on his bilateral lower extremities looking as good as I have seen him in quite some time. First of all the lateral heels are both healed. There is callus here however no open wounds. The substantial area on his left upper lateral tibia which has been so refractory looks a lot better. He still has multiple small open areas on the dorsal left foot and ankle. One of these at the crease of the foot and ankle is actually quite deep and has debris in the center. On the right leg he only has one small open area on the right lateral leg this is just about closed We apparently had the order of silver alginate they come in with polymen Ag. However given the fact that things look so much better I am going to continue with the latter 04/26/2020; patient returns to the clinic after about a 9 to 10-week hiatus. I am not exactly sure why. The patient says he had a cold and was sick. He was apparently tested for Covid but was negative. He literally dropped off our schedule. He arrives back in clinic with a multitude of wounds  especially on the left dorsal foot and lower leg. There are also areas on the right dorsal foot and the right lower leg. Fortunately the pressure ulcers on the bilateral lateral heels of once again closed. Again the history here is vague. Apparently everything is deteriorated on the left greater than right over the last 2 to 3 weeks. His daughter wondered whether they were not wrapping this properly they have been applying topical antibiotic and a kerlix Coban wrap. He absolutely denies any pain 1/11; I put him under our kerlix, Coban wrap and the edema control is a lot better. They have not been changing this at home. There has been some improvement in his wounds except on the left dorsal foot and ankle. In fact he has a new wound on the left dorsal foot. The last time he was here I reviewed his past arterial studies and I am going to repeat them. Through this timeframe the wounds on his bilateral heels, pressure ulcers have actually managed to heal. 1/25; patient arrives with by my calculation new wounds on the right dorsal foot right  heel. He has the area on his left ankle with exposed tendon that is larger he has the chronic area on the left lateral upper tibia which is no closer to healing. He has another area on the right lateral heel. We have been using silver alginate to everything except the tendon area on the left ankle He is going for his arterial studies this afternoon these are repeat from those that were done in June 2020. I am not sure I am convinced these are venous wounds 2/8; the patient had his REPEAT ARTERIAL STUDIES not much worse than previously but with the same pattern. On the right side his ABI was 0.89 with a TBI of 0.48 and monophasic waveforms on the left side ABI of 0.92 but with a TBI of 0.42 and monophasic biphasic waveforms the great toe pressure on the right was 57 on the left 50. I am wondering whether this represents falsely elevated ABIs from medial vessel  calcification plus or minus microvascular disease. He has nonhealing wounds on the left dorsal foot with exposed tendon left dorsal foot distally in the left lateral calf. I have not been able to do anything with these wounds. We have been using silver collagen in the feet silver alginate in the legs He was in the ER last week and missed his appointment. He was felt to have influenza A I believe. I note from his lab work that his creatinine was 2.34 indicative of stage IV chronic renal failure he also had significant hypoalbuminemia. Certainly his creatinine is going to take make any thought about angiogram very risky here. Electronic Signature(s) Signed: 05/31/2020 4:37:05 PM By: Linton Ham MD Entered By: Linton Ham on 05/31/2020 10:29:48 -------------------------------------------------------------------------------- Physical Exam Details Patient Name: Date of Service: [name] [name] F. 05/31/2020 8:45 A M Medical Record Number: [id number] Patient Account Number: [id number] Date of Birth/Sex: Treating RN: [birth year] (75 y.o. [name] Primary Care Provider: Other Clinician: [name], [name] Referring Provider: Treating Provider/Extender: [name], [name] [name], [name] Weeks in Treatment: 215 Constitutional Sitting or standing Blood Pressure is within target range for patient.. Pulse regular and within target range for patient.Marland Kitchen Respirations regular, non-labored and within target range.. Temperature is normal and within the target range for the patient.Marland Kitchen Appears in no distress. Notes Wound exam; the area on the dorsal foot and ankle again completely unchanged covered in necrotic debris that I removed. The area on the ankle is tendon. The wound on the left lateral mid tibia area again is necrotic this is never closed. I debrided this with a #5 curette it looks better but not making any progress. He has an area on the right heel right dorsal foot and then abrasions on the  right knee. Electronic Signature(s) Signed: 05/31/2020 4:37:05 PM By: Linton Ham MD Entered By: Linton Ham on 05/31/2020 10:31:07 -------------------------------------------------------------------------------- Physician Orders Details Patient Name: Date of Service: [name] [name] F. 05/31/2020 8:45 A M Medical Record Number: [id number] Patient Account Number: [id number] Date of Birth/Sex: Treating RN: [birth year] (75 y.o. [name] Primary Care Provider: [name], [name] Other Clinician: Referring Provider: Treating Provider/Extender: [name], [name] [name], [name] Weeks in Treatment: 215 Verbal / Phone Orders: No Diagnosis Coding Follow-up Appointments Return Appointment in 2 weeks. Bathing/ Shower/ Hygiene May shower with protection but do not get wound dressing(s) wet. Edema Control - Lymphedema / SCD / Other Elevate legs to the level of the heart or above for 30 minutes daily and/or when sitting, a frequency of: Avoid standing for  long periods of time. Off-Loading Other: - multipodus boots to both feet Wound Treatment Wound #14 - Lower Leg Wound Laterality: Left, Lateral Cleanser: Soap and Water 1 x Per Week/15 Days Discharge Instructions: May shower and wash wound with dial antibacterial soap and water prior to dressing change. Peri-Wound Care: Sween Lotion (Moisturizing lotion) 1 x Per Week/15 Days Discharge Instructions: Apply moisturizing lotion as directed Prim Dressing: KerraCel Ag Gelling Fiber Dressing, 4x5 in (silver alginate) 1 x Per Week/15 Days ary Discharge Instructions: Apply silver alginate to wound bed as instructed Secondary Dressing: Woven Gauze Sponge, Non-Sterile 4x4 in 1 x Per Week/15 Days Discharge Instructions: Apply over primary dressing as directed. Secondary Dressing: ABD Pad, 5x9 1 x Per Week/15 Days Discharge Instructions: Apply over primary dressing as directed. Compression Wrap: Kerlix Roll 4.5x3.1 (in/yd) 1 x Per Week/15  Days Discharge Instructions: Apply Kerlix and Coban compression as directed. Compression Wrap: Coban Self-Adherent Wrap 4x5 (in/yd) 1 x Per Week/15 Days Discharge Instructions: Apply over Kerlix as directed. Wound #25 - Lower Leg Wound Laterality: Right, Lateral Cleanser: Soap and Water 1 x Per Week/15 Days Discharge Instructions: May shower and wash wound with dial antibacterial soap and water prior to dressing change. Peri-Wound Care: Sween Lotion (Moisturizing lotion) 1 x Per Week/15 Days Discharge Instructions: Apply moisturizing lotion as directed Prim Dressing: KerraCel Ag Gelling Fiber Dressing, 4x5 in (silver alginate) 1 x Per Week/15 Days ary Discharge Instructions: Apply silver alginate to wound bed as instructed Secondary Dressing: Woven Gauze Sponge, Non-Sterile 4x4 in 1 x Per Week/15 Days Discharge Instructions: Apply over primary dressing as directed. Secondary Dressing: ABD Pad, 5x9 1 x Per Week/15 Days Discharge Instructions: Apply over primary dressing as directed. Compression Wrap: Kerlix Roll 4.5x3.1 (in/yd) 1 x Per Week/15 Days Discharge Instructions: Apply Kerlix and Coban compression as directed. Compression Wrap: Coban Self-Adherent Wrap 4x5 (in/yd) 1 x Per Week/15 Days Discharge Instructions: Apply over Kerlix as directed. Wound #26 - Foot Wound Laterality: Dorsal, Left Cleanser: Soap and Water 1 x Per Week/15 Days Discharge Instructions: May shower and wash wound with dial antibacterial soap and water prior to dressing change. Peri-Wound Care: Sween Lotion (Moisturizing lotion) 1 x Per Week/15 Days Discharge Instructions: Apply moisturizing lotion as directed Prim Dressing: Promogran Prisma Matrix, 4.34 (sq in) (silver collagen) 1 x Per Week/15 Days ary Discharge Instructions: Moisten collagen with saline or hydrogel Secondary Dressing: Woven Gauze Sponge, Non-Sterile 4x4 in 1 x Per Week/15 Days Discharge Instructions: Apply over primary dressing as  directed. Secondary Dressing: ABD Pad, 5x9 1 x Per Week/15 Days Discharge Instructions: Apply over primary dressing as directed. Compression Wrap: Kerlix Roll 4.5x3.1 (in/yd) 1 x Per Week/15 Days Discharge Instructions: Apply Kerlix and Coban compression as directed. Compression Wrap: Coban Self-Adherent Wrap 4x5 (in/yd) 1 x Per Week/15 Days Discharge Instructions: Apply over Kerlix as directed. Wound #28 - Foot Wound Laterality: Dorsal, Right Cleanser: Soap and Water 1 x Per Week/15 Days Discharge Instructions: May shower and wash wound with dial antibacterial soap and water prior to dressing change. Peri-Wound Care: Sween Lotion (Moisturizing lotion) 1 x Per Week/15 Days Discharge Instructions: Apply moisturizing lotion as directed Prim Dressing: KerraCel Ag Gelling Fiber Dressing, 4x5 in (silver alginate) 1 x Per Week/15 Days ary Discharge Instructions: Apply silver alginate to wound bed as instructed Secondary Dressing: Woven Gauze Sponge, Non-Sterile 4x4 in 1 x Per Week/15 Days Discharge Instructions: Apply over primary dressing as directed. Secondary Dressing: ABD Pad, 5x9 1 x Per Week/15 Days Discharge Instructions: Apply over  primary dressing as directed. Compression Wrap: Kerlix Roll 4.5x3.1 (in/yd) 1 x Per Week/15 Days Discharge Instructions: Apply Kerlix and Coban compression as directed. Compression Wrap: Coban Self-Adherent Wrap 4x5 (in/yd) 1 x Per Week/15 Days Discharge Instructions: Apply over Kerlix as directed. Wound #29 - Calcaneus Wound Laterality: Right Cleanser: Soap and Water 1 x Per Week/15 Days Discharge Instructions: May shower and wash wound with dial antibacterial soap and water prior to dressing change. Peri-Wound Care: Sween Lotion (Moisturizing lotion) 1 x Per Week/15 Days Discharge Instructions: Apply moisturizing lotion as directed Prim Dressing: KerraCel Ag Gelling Fiber Dressing, 4x5 in (silver alginate) 1 x Per Week/15 Days ary Discharge Instructions:  Apply silver alginate to wound bed as instructed Secondary Dressing: Woven Gauze Sponge, Non-Sterile 4x4 in 1 x Per Week/15 Days Discharge Instructions: Apply over primary dressing as directed. Secondary Dressing: ABD Pad, 5x9 1 x Per Week/15 Days Discharge Instructions: Apply over primary dressing as directed. Compression Wrap: Kerlix Roll 4.5x3.1 (in/yd) 1 x Per Week/15 Days Discharge Instructions: Apply Kerlix and Coban compression as directed. Compression Wrap: Coban Self-Adherent Wrap 4x5 (in/yd) 1 x Per Week/15 Days Discharge Instructions: Apply over Kerlix as directed. Wound #31 - Knee Wound Laterality: Right Cleanser: Soap and Water 1 x Per Week/15 Days Discharge Instructions: May shower and wash wound with dial antibacterial soap and water prior to dressing change. Peri-Wound Care: Sween Lotion (Moisturizing lotion) 1 x Per Week/15 Days Discharge Instructions: Apply moisturizing lotion as directed Prim Dressing: KerraCel Ag Gelling Fiber Dressing, 4x5 in (silver alginate) 1 x Per Week/15 Days ary Discharge Instructions: Apply silver alginate to wound bed as instructed Secondary Dressing: Woven Gauze Sponge, Non-Sterile 4x4 in 1 x Per Week/15 Days Discharge Instructions: Apply over primary dressing as directed. Secondary Dressing: ABD Pad, 5x9 1 x Per Week/15 Days Discharge Instructions: Apply over primary dressing as directed. Compression Wrap: Kerlix Roll 4.5x3.1 (in/yd) 1 x Per Week/15 Days Discharge Instructions: Apply Kerlix and Coban compression as directed. Compression Wrap: Coban Self-Adherent Wrap 4x5 (in/yd) 1 x Per Week/15 Days Discharge Instructions: Apply over Kerlix as directed. Wound #32 - Foot Wound Laterality: Dorsal, Left, Distal Cleanser: Soap and Water 1 x Per Week/15 Days Discharge Instructions: May shower and wash wound with dial antibacterial soap and water prior to dressing change. Peri-Wound Care: Sween Lotion (Moisturizing lotion) 1 x Per Week/15  Days Discharge Instructions: Apply moisturizing lotion as directed Prim Dressing: Promogran Prisma Matrix, 4.34 (sq in) (silver collagen) 1 x Per Week/15 Days ary Discharge Instructions: Moisten collagen with saline or hydrogel Secondary Dressing: Woven Gauze Sponge, Non-Sterile 4x4 in 1 x Per Week/15 Days Discharge Instructions: Apply over primary dressing as directed. Secondary Dressing: ABD Pad, 5x9 1 x Per Week/15 Days Discharge Instructions: Apply over primary dressing as directed. Compression Wrap: Kerlix Roll 4.5x3.1 (in/yd) 1 x Per Week/15 Days Discharge Instructions: Apply Kerlix and Coban compression as directed. Compression Wrap: Coban Self-Adherent Wrap 4x5 (in/yd) 1 x Per Week/15 Days Discharge Instructions: Apply over Kerlix as directed. Electronic Signature(s) Signed: 05/31/2020 4:37:05 PM By: Linton Ham MD Signed: 05/31/2020 5:05:22 PM By: [name] Entered By: [name] on 05/31/2020 10:29:47 -------------------------------------------------------------------------------- Problem List Details Patient Name: Date of Service: [name] [name] F. 05/31/2020 8:45 A M Medical Record Number: [id number] Patient Account Number: [id number] Date of Birth/Sex: Treating RN: [birth year] (75 y.o. [name] Primary Care Provider: [name], [name] Other Clinician: Referring Provider: Treating Provider/Extender: [name], [name] [name], [name] Weeks in Treatment: 215 Active Problems ICD-10 Encounter Code Description Active  Date MDM Diagnosis E11.621 Type 2 diabetes mellitus with foot ulcer 04/17/2016 No Yes L89.613 Pressure ulcer of right heel, stage 3 04/17/2016 No Yes L97.518 Non-pressure chronic ulcer of other part of right foot with other specified 05/17/2020 No Yes severity L97.221 Non-pressure chronic ulcer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 with improved areas on the left calf and the dorsal left foot/ankle. However he has a reopening on the right lateral calcaneus. 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 He is mystified about how this would have happened although looking at how he lies in the examining table is exactly how this would have happened although he claims he uses bunny boots at night etc. We have been using Sorbact to all wound areas. He had a significant excoriated rash on his left leg when he was here last time. We changed from Kerlix to cotton under the Coban and this  seems to have helped 2/2; 1 month follow-up. The patient has open areas on the left lateral tibial area mid lower leg, left dorsal foot/ankle and an area on the right lateral calcaneus. The right lateral calcaneus is better as is the left dorsal foot/ankle. The area on the left lateral tibia however is larger. 3/9; 1 month follow-up. The left lateral tibia looks mostly the same. Left dorsal foot and ankle improved and the area on the right lateral calcaneus is quite a bit worse. He has had previous arterial studies last year that were fairly normal. I continue to think that these are pressure areas. We have been using silver  alginate on all the wounds 3/30; 3-week follow-up. ooThe left lateral tibia is still open but with less width. ooLeft dorsal foot small punched out wound. ooHe has a large area on his right lateral calcaneus and an area that I did not pick up on last time on the tip of the left calcaneus ooAs well he has a new area on the right lateral calf which was new today. 4/13; this is a patient with multiple medical problems. He has been in this clinic for a very prolonged period of time with different wounds in different areas in his lower extremities. Currently we have the left lateral tibia, left heel, left dorsal foot, right lateral heel and right lateral lower tibia. We have been using Santyl on the right and silver alginate on the left He has his daughter who changes his dressings. The reason behind the nonhealing here and ulcer recurrence is really never been clear to me. The patient is debilitated. He uses bunny boots apparently at night to protect his heels and feet. His arterial studies have previously not suggested a primary arterial etiology 4/27 2-week follow-up. He has a new wound on the right anterior patella currently trauma against the wheelchair. Failing this had he has areas on the right lateral calf and right lateral calcaneus both of which we have been using Santyl. On the left side one of the original wounds on the left lateral tibia area, left calcaneus, punched out areas on the left dorsal foot. On the left we have been using PolyMem Ag 5/11; 2-week follow-up. He does not have any new wounds this week and most of his wounds look better. Using Santyl on the right lateral heel and right lateral ankle and polymen Ag and everything else under compression. 09/15/19-Patient at 2-week follow-up has to the wounds about the same and the rest of the wounds smaller in dimensions, the left posterior wound is about the same, one of the wounds is healed on the right knee. 6/8; 2-week follow-up.  His left lateral calf wound is now split into 2. He has an area on the left heel which is a pressure ulcer which I think is improving he also had the wrap injury areas on the crease of his left ankle/foot that are better. Still an area on the right heel that is also improved we have been using PolyMem Ag 6/22; 2-week follow-up. The left lateral calf wound measures about the same. ooLeft heel is closed ooRight heel still open but looks better. ooAssorted wounds on the dorsal foot and ankle 1 of these requires debridement small wound with some depth. ooWe have been using PolyMem Ag to all wound areas 7/13; ooLeft heel is closed ooRight heel is open but looks smaller ooSmall area on the right lateral calf looks smaller ooSmattering of wounds on the dorsal left foot and  ankle. All of these are superficial ooThe substantial area on the left lateral mid tibia. Necrotic debris on the surface. I really do not completely understand this wound. Not making any progress. We have been using PolyMem Ag to all the wounds 8/10-Patient returns at 1 month, the left dorsal foot and ankle wounds are about the same, the left lateral wound on the leg definitely bigger, using PolyMem to all the wounds except the left lateral leg wound where Iodoflex is being tried. Overall these wounds seem to fluctuate in the progress with no clear resolution 8/31; I have not seen this man in over a month and a half. His area on the left leg is much more substantial necrotic surface much longer. Our staff noted purulent drainage. He still has breakdown in the left foot and ankle I am not quite sure how to explain this. ooOn the right side his right lateral heel is just about closed closed. Small area on the right lateral calf. ooHis left heel is also closed 9/13; 2-week follow-up. Because of the increase in size and the condition of the wound on the left lateral leg I did a PCR culture on this/deep tissue culture  last time this showed methicillin resistant staph aureus and group B strep. I use clindamycin, the patient is allergic to Septra and there was a tetracycline resistant gene detected as well 9/27; 2-week follow-up. Left lateral leg looks stable to improved slightly smaller. He has a scattering of areas on his left dorsal foot but nothing on the left heel. On the right heel a small open area on the lateral aspect and also on the lateral part of his lower leg. We have been using silver alginate under compression As I have often done in this man I feel no reviewed his arterial studies which were last looked at in June 2020 at that point he had an ABI in the right of 0.93 monophasic waveforms however but the great toe pressure was 110 with a TBI of 0.89 on the left his ABI was 0.84 with a great toe pressure of 120 and a TBI of 0.97. This was indicative of mild arterial disease 10/26; the patient arrives with the wounds on his bilateral lower extremities looking as good as I have seen him in quite some time. First of all the lateral heels are both healed. There is callus here however no open wounds. The substantial area on his left upper lateral tibia which has been so refractory looks a lot better. He still has multiple small open areas on the dorsal left foot and ankle. One of these at the crease of the foot and ankle is actually quite deep and has debris in the center. On the right leg he only has one small open area on the right lateral leg this is just about closed We apparently had the order of silver alginate they come in with polymen Ag. However given the fact that things look so much better I am going to continue with the latter 04/26/2020; patient returns to the clinic after about a 9 to 10-week hiatus. I am not exactly sure why. The patient says he had a cold and was sick. He was apparently tested for Covid but was negative. He literally dropped off our schedule. He arrives back in clinic with a  multitude of wounds especially on the left dorsal foot and lower leg. There are also areas on the right dorsal foot and the right lower leg. Fortunately the pressure ulcers  on the bilateral lateral heels of once again closed. Again the history here is vague. Apparently everything is deteriorated on the left greater than right over the last 2 to 3 weeks. His daughter wondered whether they were not wrapping this properly they have been applying topical antibiotic and a kerlix Coban wrap. He absolutely denies any pain 1/11; I put him under our kerlix, Coban wrap and the edema control is a lot better. They have not been changing this at home. There has been some improvement in his wounds except on the left dorsal foot and ankle. In fact he has a new wound on the left dorsal foot. The last time he was here I reviewed his past arterial studies and I am going to repeat them. Through this timeframe the wounds on his bilateral heels, pressure ulcers have actually managed to heal. 1/25; patient arrives with by my calculation new wounds on the right dorsal foot right heel. He has the area on his left ankle with exposed tendon that is larger he has the chronic area on the left lateral upper tibia which is no closer to healing. He has another area on the right lateral heel. We have been using silver alginate to everything except the tendon area on the left ankle He is going for his arterial studies this afternoon these are repeat from those that were done in June 2020. I am not sure I am convinced these are venous wounds 2/8; the patient had his REPEAT ARTERIAL STUDIES not much worse than previously but with the same pattern. On the right side his ABI was 0.89 with a TBI of 0.48 and monophasic waveforms on the left side ABI of 0.92 but with a TBI of 0.42 and monophasic biphasic waveforms the great toe pressure on the right was 57 on the left 50. I am wondering whether this represents falsely elevated ABIs from  medial vessel calcification plus or minus microvascular disease. He has nonhealing wounds on the left dorsal foot with exposed tendon left dorsal foot distally in the left lateral calf. I have not been able to do anything with these wounds. We have been using silver collagen in the feet silver alginate in the legs He was in the ER last week and missed his appointment. He was felt to have influenza A I believe. I note from his lab work that his creatinine was 2.34 indicative of stage IV chronic renal failure he also had significant hypoalbuminemia. Certainly his creatinine is going to take make any thought about angiogram very risky here. Objective Constitutional Sitting or standing Blood Pressure is within target range for patient.. Pulse regular and within target range for patient.Marland Kitchen Respirations regular, non-labored and within target range.. Temperature is normal and within the target range for the patient.Marland Kitchen Appears in no distress. Vitals Time Taken: 9:25 AM, Height: 74 in, Weight: 150 lbs, BMI: 19.3, Temperature: 98.5 F, Pulse: 76 bpm, Respiratory Rate: 18 breaths/min, Blood Pressure: 104/64 mmHg, Capillary Blood Glucose: 121 mg/dl. General Notes: Wound exam; the area on the dorsal foot and ankle again completely unchanged covered in necrotic debris that I removed. The area on the ankle is tendon. The wound on the left lateral mid tibia area again is necrotic this is never closed. I debrided this with a #5 curette it looks better but not making any progress. ooHe has an area on the right heel right dorsal foot and then abrasions on the right knee. Integumentary (Hair, Skin) Wound #14 status is Open. Original cause of  wound was Gradually Appeared. The wound is located on the Left,Lateral Lower Leg. The wound measures 9.5cm length x 1.7cm width x 0.2cm depth; 12.684cm^2 area and 2.537cm^3 volume. There is Fat Layer (Subcutaneous Tissue) exposed. There is no tunneling or undermining noted. There  is a medium amount of serosanguineous drainage noted. The wound margin is flat and intact. There is small (1-33%) red granulation within the wound bed. There is a large (67-100%) amount of necrotic tissue within the wound bed including Eschar and Adherent Slough. Wound #25 status is Open. Original cause of wound was Gradually Appeared. The wound is located on the Right,Lateral Lower Leg. The wound measures 2.5cm length x 1.2cm width x 0.1cm depth; 2.356cm^2 area and 0.236cm^3 volume. There is Fat Layer (Subcutaneous Tissue) exposed. There is no tunneling or undermining noted. There is a medium amount of serosanguineous drainage noted. The wound margin is flat and intact. There is large (67-100%) red granulation within the wound bed. There is a small (1-33%) amount of necrotic tissue within the wound bed including Adherent Slough. Wound #26 status is Open. Original cause of wound was Gradually Appeared. The wound is located on the Left,Dorsal Foot. The wound measures 3.5cm length x 1.3cm width x 0.1cm depth; 3.574cm^2 area and 0.357cm^3 volume. There is Fat Layer (Subcutaneous Tissue) exposed. There is no tunneling or undermining noted. There is a medium amount of serosanguineous drainage noted. The wound margin is flat and intact. There is small (1-33%) red, pink granulation within the wound bed. There is a large (67-100%) amount of necrotic tissue within the wound bed including Eschar and Adherent Slough. Wound #28 status is Open. Original cause of wound was Gradually Appeared. The wound is located on the Right,Dorsal Foot. The wound measures 0.7cm length x 0.6cm width x 0.1cm depth; 0.33cm^2 area and 0.033cm^3 volume. There is Fat Layer (Subcutaneous Tissue) exposed. There is no tunneling or undermining noted. There is a small amount of serosanguineous drainage noted. The wound margin is flat and intact. There is small (1-33%) pink granulation within the wound bed. There is a large (67-100%) amount of  necrotic tissue within the wound bed including Adherent Slough. Wound #29 status is Open. Original cause of wound was Gradually Appeared. The wound is located on the Right Calcaneus. The wound measures 0.2cm length x 0.2cm width x 0.2cm depth; 0.031cm^2 area and 0.006cm^3 volume. There is no tunneling or undermining noted. There is a small amount of serous drainage noted. The wound margin is flat and intact. There is large (67-100%) pink granulation within the wound bed. There is no necrotic tissue within the wound bed. Wound #30 status is Healed - Epithelialized. Original cause of wound was Gradually Appeared. The wound is located on the Left,Anterior Lower Leg. The wound measures 0cm length x 0cm width x 0cm depth; 0cm^2 area and 0cm^3 volume. There is no tunneling or undermining noted. There is a none present amount of drainage noted. The wound margin is flat and intact. There is no granulation within the wound bed. There is no necrotic tissue within the wound bed. Wound #31 status is Open. Original cause of wound was Gradually Appeared. The wound is located on the Right Knee. The wound measures 0.2cm length x 0.2cm width x 0.1cm depth; 0.031cm^2 area and 0.003cm^3 volume. There is Fat Layer (Subcutaneous Tissue) exposed. There is no tunneling or undermining noted. There is a small amount of serosanguineous drainage noted. The wound margin is flat and intact. There is large (67-100%) red, pink granulation within  the wound bed. There is no necrotic tissue within the wound bed. Wound #32 status is Open. Original cause of wound was Gradually Appeared. The wound is located on the Left,Distal,Dorsal Foot. The wound measures 1cm length x 1.5cm width x 0.1cm depth; 1.178cm^2 area and 0.118cm^3 volume. There is Fat Layer (Subcutaneous Tissue) exposed. There is no tunneling or undermining noted. There is a medium amount of serosanguineous drainage noted. The wound margin is flat and intact. There is small  (1-33%) pink granulation within the wound bed. There is a large (67-100%) amount of necrotic tissue within the wound bed including Eschar and Adherent Slough. Wound #33 status is Open. Original cause of wound was Gradually Appeared. The wound is located on the Right,Distal,Dorsal Foot. The wound measures 0.4cm length x 0.4cm width x 0.1cm depth; 0.126cm^2 area and 0.013cm^3 volume. There is Fat Layer (Subcutaneous Tissue) exposed. There is no tunneling or undermining noted. There is a small amount of serous drainage noted. The wound margin is flat and intact. There is medium (34-66%) pink granulation within the wound bed. There is a medium (34-66%) amount of necrotic tissue within the wound bed including Adherent Slough. 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 There was a Excisional Skin/Subcutaneous Tissue/Muscle Debridement with a total area of 16.15 sq cm performed by [name], MD. With the following instrument(s): Curette to remove Viable and Non-Viable tissue/material. Material removed includes T endon, Subcutaneous Tissue, Skin: Dermis, and Skin: Epidermis after achieving pain control using Lidocaine. No specimens were taken. A time out was conducted at 10:17, prior to the start of the procedure. A Minimum amount of bleeding was  controlled with Pressure. The procedure was tolerated well with a pain level of 0 throughout and a pain level of 0 following the procedure. Post Debridement Measurements: 9.5cm length x 1.7cm width x 0.2cm depth; 2.537cm^3 volume. Character of Wound/Ulcer Post Debridement is improved. Severity of Tissue Post Debridement is: Fat layer exposed. Post procedure Diagnosis Wound #14: Same as Pre-Procedure Wound #26 Pre-procedure diagnosis of Wound #26 is a Diabetic Wound/Ulcer of the Lower Extremity located on the Left,Dorsal Foot .Severity of Tissue Pre Debridement is: Fat layer exposed. There was a Excisional Skin/Subcutaneous Tissue/Muscle Debridement with a total area of 4.55 sq cm performed by [name], MD. With the following instrument(s): Blade, and Forceps to remove Viable and Non-Viable tissue/material. Material removed includes T endon, Subcutaneous Tissue, Skin: Dermis, and Skin: Epidermis after achieving pain control using Lidocaine. No specimens were taken. A time out was conducted at 10:15, prior to the start of the procedure. A Minimum amount of bleeding was controlled with Pressure. The procedure was tolerated well with a pain level of 0 throughout and a pain level of 0 following the procedure. Post Debridement Measurements: 3.5cm length x 1.3cm width x 0.1cm depth; 0.357cm^3 volume. Character of Wound/Ulcer Post Debridement is improved. Severity of Tissue Post Debridement is: Fat  layer exposed. Post procedure Diagnosis Wound #26: Same as Pre-Procedure Wound #32 Pre-procedure diagnosis of Wound #32 is a Diabetic Wound/Ulcer of the Lower Extremity located on the Left,Distal,Dorsal Foot .Severity of Tissue Pre Debridement is: Fat layer exposed. There was a Excisional Skin/Subcutaneous Tissue/Muscle Debridement with a total area of 1.5 sq cm performed by [name], MD. With the following instrument(s): Blade, and Forceps to remove Viable and Non-Viable tissue/material.  Material removed includes Tendon, Subcutaneous Tissue, Skin: Dermis, and Skin: Epidermis after achieving pain control using Lidocaine. No specimens were taken. A time out was conducted at 10:16, prior to the start of the procedure. A Minimum amount of bleeding was controlled with Pressure. The procedure was tolerated well with a pain level of 0 throughout and a pain level of 0 following the procedure. Post Debridement Measurements: 1cm length x 1.5cm width x 0.1cm depth; 0.118cm^3 volume. Character of Wound/Ulcer Post Debridement is improved. Severity of Tissue Post Debridement is: Fat layer exposed. Post procedure Diagnosis Wound #32: Same as Pre-Procedure Plan Follow-up Appointments: Return Appointment in 2 weeks. Bathing/ Shower/ Hygiene: May shower with protection but do not get wound dressing(s) wet. Edema Control - Lymphedema / SCD / Other: Elevate legs to the level of the heart or above for 30 minutes daily and/or when sitting, a frequency of: Avoid standing for long periods of time. Off-Loading: Other: - multipodus boots to both feet WOUND #14: - Lower Leg Wound Laterality: Left, Lateral Cleanser: Soap and Water 1 x Per Week/15 Days Discharge Instructions: May shower and wash wound with dial antibacterial soap and water prior to dressing change. Peri-Wound Care: Sween Lotion (Moisturizing lotion) 1 x Per Week/15 Days Discharge Instructions: Apply moisturizing lotion as directed Prim Dressing: KerraCel Ag Gelling Fiber Dressing, 4x5 in (silver alginate) 1 x Per Week/15 Days ary Discharge Instructions: Apply silver alginate to wound bed as instructed Secondary Dressing: Woven Gauze Sponge, Non-Sterile 4x4 in 1 x Per Week/15 Days Discharge Instructions: Apply over primary dressing as directed. Secondary Dressing: ABD Pad, 5x9 1 x Per Week/15 Days Discharge Instructions: Apply over primary dressing as directed. Com pression Wrap: Kerlix Roll 4.5x3.1 (in/yd) 1 x Per Week/15  Days Discharge Instructions: Apply Kerlix and Coban compression as directed. Com pression Wrap: Coban Self-Adherent Wrap 4x5 (in/yd) 1 x Per Week/15 Days Discharge Instructions: Apply over Kerlix as directed. WOUND #25: - Lower Leg Wound Laterality: Right, Lateral Cleanser: Soap and Water 1 x Per Week/15 Days Discharge Instructions: May shower and wash wound with dial antibacterial soap and water prior to dressing change. Peri-Wound Care: Sween Lotion (Moisturizing lotion) 1 x Per Week/15 Days Discharge Instructions: Apply moisturizing lotion as directed Prim Dressing: KerraCel Ag Gelling Fiber Dressing, 4x5 in (silver alginate) 1 x Per Week/15 Days ary Discharge Instructions: Apply silver alginate to wound bed as instructed Secondary Dressing: Woven Gauze Sponge, Non-Sterile 4x4 in 1 x Per Week/15 Days Discharge Instructions: Apply over primary dressing as directed. Secondary Dressing: ABD Pad, 5x9 1 x Per Week/15 Days Discharge Instructions: Apply over primary dressing as directed. Com pression Wrap: Kerlix Roll 4.5x3.1 (in/yd) 1 x Per Week/15 Days Discharge Instructions: Apply Kerlix and Coban compression as directed. Com pression Wrap: Coban Self-Adherent Wrap 4x5 (in/yd) 1 x Per Week/15 Days Discharge Instructions: Apply over Kerlix as directed. WOUND #26: - Foot Wound Laterality: Dorsal, Left Cleanser: Soap and Water 1 x Per Week/15 Days Discharge Instructions: May shower and wash wound with dial antibacterial soap and water prior to dressing change. Peri-Wound Care: Maude Leriche (  Moisturizing lotion) 1 x Per Week/15 Days Discharge Instructions: Apply moisturizing lotion as directed Prim Dressing: Promogran Prisma Matrix, 4.34 (sq in) (silver collagen) 1 x Per Week/15 Days ary Discharge Instructions: Moisten collagen with saline or hydrogel Secondary Dressing: Woven Gauze Sponge, Non-Sterile 4x4 in 1 x Per Week/15 Days Discharge Instructions: Apply over primary dressing as  directed. Secondary Dressing: ABD Pad, 5x9 1 x Per Week/15 Days Discharge Instructions: Apply over primary dressing as directed. Com pression Wrap: Kerlix Roll 4.5x3.1 (in/yd) 1 x Per Week/15 Days Discharge Instructions: Apply Kerlix and Coban compression as directed. Com pression Wrap: Coban Self-Adherent Wrap 4x5 (in/yd) 1 x Per Week/15 Days Discharge Instructions: Apply over Kerlix as directed. WOUND #28: - Foot Wound Laterality: Dorsal, Right Cleanser: Soap and Water 1 x Per Week/15 Days Discharge Instructions: May shower and wash wound with dial antibacterial soap and water prior to dressing change. Peri-Wound Care: Sween Lotion (Moisturizing lotion) 1 x Per Week/15 Days Discharge Instructions: Apply moisturizing lotion as directed Prim Dressing: KerraCel Ag Gelling Fiber Dressing, 4x5 in (silver alginate) 1 x Per Week/15 Days ary Discharge Instructions: Apply silver alginate to wound bed as instructed Secondary Dressing: Woven Gauze Sponge, Non-Sterile 4x4 in 1 x Per Week/15 Days Discharge Instructions: Apply over primary dressing as directed. Secondary Dressing: ABD Pad, 5x9 1 x Per Week/15 Days Discharge Instructions: Apply over primary dressing as directed. Com pression Wrap: Kerlix Roll 4.5x3.1 (in/yd) 1 x Per Week/15 Days Discharge Instructions: Apply Kerlix and Coban compression as directed. Com pression Wrap: Coban Self-Adherent Wrap 4x5 (in/yd) 1 x Per Week/15 Days Discharge Instructions: Apply over Kerlix as directed. WOUND #29: - Calcaneus Wound Laterality: Right Cleanser: Soap and Water 1 x Per Week/15 Days Discharge Instructions: May shower and wash wound with dial antibacterial soap and water prior to dressing change. Peri-Wound Care: Sween Lotion (Moisturizing lotion) 1 x Per Week/15 Days Discharge Instructions: Apply moisturizing lotion as directed Prim Dressing: KerraCel Ag Gelling Fiber Dressing, 4x5 in (silver alginate) 1 x Per Week/15 Days ary Discharge  Instructions: Apply silver alginate to wound bed as instructed Secondary Dressing: Woven Gauze Sponge, Non-Sterile 4x4 in 1 x Per Week/15 Days Discharge Instructions: Apply over primary dressing as directed. Secondary Dressing: ABD Pad, 5x9 1 x Per Week/15 Days Discharge Instructions: Apply over primary dressing as directed. Com pression Wrap: Kerlix Roll 4.5x3.1 (in/yd) 1 x Per Week/15 Days Discharge Instructions: Apply Kerlix and Coban compression as directed. Com pression Wrap: Coban Self-Adherent Wrap 4x5 (in/yd) 1 x Per Week/15 Days Discharge Instructions: Apply over Kerlix as directed. WOUND #31: - Knee Wound Laterality: Right Cleanser: Soap and Water 1 x Per Week/15 Days Discharge Instructions: May shower and wash wound with dial antibacterial soap and water prior to dressing change. Peri-Wound Care: Sween Lotion (Moisturizing lotion) 1 x Per Week/15 Days Discharge Instructions: Apply moisturizing lotion as directed Prim Dressing: KerraCel Ag Gelling Fiber Dressing, 4x5 in (silver alginate) 1 x Per Week/15 Days ary Discharge Instructions: Apply silver alginate to wound bed as instructed Secondary Dressing: Woven Gauze Sponge, Non-Sterile 4x4 in 1 x Per Week/15 Days Discharge Instructions: Apply over primary dressing as directed. Secondary Dressing: ABD Pad, 5x9 1 x Per Week/15 Days Discharge Instructions: Apply over primary dressing as directed. Com pression Wrap: Kerlix Roll 4.5x3.1 (in/yd) 1 x Per Week/15 Days Discharge Instructions: Apply Kerlix and Coban compression as directed. Com pression Wrap: Coban Self-Adherent Wrap 4x5 (in/yd) 1 x Per Week/15 Days Discharge Instructions: Apply over Kerlix as directed. WOUND #32: - Foot Wound  Laterality: Dorsal, Left, Distal Cleanser: Soap and Water 1 x Per Week/15 Days Discharge Instructions: May shower and wash wound with dial antibacterial soap and water prior to dressing change. Peri-Wound Care: Sween Lotion (Moisturizing lotion) 1 x  Per Week/15 Days Discharge Instructions: Apply moisturizing lotion as directed Prim Dressing: Promogran Prisma Matrix, 4.34 (sq in) (silver collagen) 1 x Per Week/15 Days ary Discharge Instructions: Moisten collagen with saline or hydrogel Secondary Dressing: Woven Gauze Sponge, Non-Sterile 4x4 in 1 x Per Week/15 Days Discharge Instructions: Apply over primary dressing as directed. Secondary Dressing: ABD Pad, 5x9 1 x Per Week/15 Days Discharge Instructions: Apply over primary dressing as directed. Com pression Wrap: Kerlix Roll 4.5x3.1 (in/yd) 1 x Per Week/15 Days Discharge Instructions: Apply Kerlix and Coban compression as directed. Com pression Wrap: Coban Self-Adherent Wrap 4x5 (in/yd) 1 x Per Week/15 Days Discharge Instructions: Apply over Kerlix as directed. 1. I am still using silver collagen and everything on the feet and ankles and alginate on the legs 2. I am going to have him seen by vein and vascular. My major question is does the ABI give [place] a false sense of security about the degree of macrovascular disease i.e. medial vessel calcification plus or minus microvascular disease resulting in the low TBI's and toe pressures. If so I do not believe this is going to heal. 3. The patient originally came here with pressure ulcers on both heels that we thought eventually the heel almost immediately after that which was probably over a year ago he developed the area on the left lateral tibia and then more recently the areas on the dorsal feet. I have never been able to get any of this to progress #4 the patient has stage IV chronic renal failure. Standard dye will probably not be in order. He also has severe hypoalbuminemia. He is not spilling protein in his urine therefore this is probably nutritional Electronic Signature(s) Signed: 05/31/2020 4:37:05 PM By: Linton Ham MD Entered By: Linton Ham on 05/31/2020  10:33:17 -------------------------------------------------------------------------------- SuperBill Details Patient Name: Date of Service: [name] [name] F. 05/31/2020 Medical Record Number: [id number] Patient Account Number: [id number] Date of Birth/Sex: Treating RN: 1946-01-10 (75 y.o. [name], [place] Primary Care Provider: [name], [name] Other Clinician: Referring Provider: Treating Provider/Extender: [name], [name] [name], [name] Weeks in Treatment: 215 Diagnosis Coding ICD-10 Codes Code Description E11.621 Type 2 diabetes mellitus with foot ulcer L89.613 Pressure ulcer of right heel, stage 3 L97.518 Non-pressure chronic ulcer of other part of right foot with other specified severity L97.221 Non-pressure chronic ulcer of left calf limited to breakdown of skin L97.321 Non-pressure chronic ulcer of left ankle limited to breakdown of skin L97.521 Non-pressure chronic ulcer of other part of left foot limited to breakdown of skin L97.211 Non-pressure chronic ulcer of right calf limited to breakdown of skin Facility Procedures CPT4 Code: [id number] Description: 11043 - DEB MUSC/FASCIA 20 SQ CM/< ICD-10 Diagnosis Description L97.221 Non-pressure chronic ulcer of left calf limited to breakdown of skin Modifier: Quantity: 1 CPT4 Code: [id number] Description: 11046 - DEB MUSC/FASCIA EA ADDL 20 CM ICD-10 Diagnosis Description L97.521 Non-pressure chronic ulcer of other part of left foot limited to breakdown of sk Modifier: in Quantity: 1 Physician Procedures : CPT4 Code Description Modifier [id number] 11043 - WC PHYS DEBR MUSCLE/FASCIA 20 SQ CM ICD-10 Diagnosis Description L97.221 Non-pressure chronic ulcer of left calf limited to breakdown of skin Quantity: 1 : [id number] 11046 - WC PHYS DEB MUSC/FASC EA ADDL 20 CM ICD-10 Diagnosis Description L97.521 Non-pressure  chronic ulcer of other part of left foot limited to breakdown of skin Quantity: 1 Electronic Signature(s) Signed: 05/31/2020  4:37:05 PM By: Linton Ham MD Signed: 05/31/2020 5:05:22 PM By: [name] Entered By: [name] on 05/31/2020 10:26:45

## 2020-05-31 NOTE — Progress Notes (Signed)
the Lower Extremity Etiology: Wound Location: Right Calcaneus Wound Open Wounding Event: Gradually Appeared Status: Date Acquired: 04/26/2020 Comorbid Anemia, Sleep Apnea, Arrhythmia, Congestive Heart Failure, Weeks Of Treatment: 5 History: Coronary Artery Disease, Hypertension, Myocardial Infarction, Clustered Wound: No Clustered Wound: No Type II Diabetes, Gout, Neuropathy Wound Measurements Length: (cm) 0.2 Width: (cm) 0.2 Depth: (cm) 0.2 Area: (cm) 0.031 Volume: (cm) 0.006 % Reduction in Area: 98.3% % Reduction in Volume: 96.7% Epithelialization: Large (67-100%) Tunneling: No Undermining: No Wound Description Classification: Grade 1 Wound Margin: Flat and Intact Exudate Amount: Small Exudate Type: Serous Exudate Color: amber Foul Odor After Cleansing: No Slough/Fibrino No Wound Bed Granulation Amount: Large (67-100%) Exposed Structure Granulation Quality: Pink Fascia Exposed: No Necrotic Amount: None Present (0%) Fat Layer (Subcutaneous Tissue) Exposed: No Tendon Exposed: No Muscle Exposed: No Joint Exposed: No Bone Exposed: No Treatment Notes Wound #29 (Calcaneus) Wound Laterality: Right Cleanser Soap and Water Discharge Instruction: May shower and wash wound with dial  antibacterial soap and water prior to dressing change. Peri-Wound Care Sween Lotion (Moisturizing lotion) Discharge Instruction: Apply moisturizing lotion as directed Topical Primary Dressing KerraCel Ag Gelling Fiber Dressing, 4x5 in (silver alginate) Discharge Instruction: Apply silver alginate to wound bed as instructed Secondary Dressing Woven Gauze Sponge, Non-Sterile 4x4 in Discharge Instruction: Apply over primary dressing as directed. ABD Pad, 5x9 Discharge Instruction: Apply over primary dressing as directed. Secured With Compression Wrap Kerlix Roll 4.5x3.1 (in/yd) Discharge Instruction: Apply Kerlix and Coban compression as directed. Coban Self-Adherent Wrap 4x5 (in/yd) Discharge Instruction: Apply over Kerlix as directed. Compression Stockings Add-Ons Electronic Signature(s) Signed: 05/31/2020 5:05:22 PM By: [name] Signed: 05/31/2020 5:13:50 PM By: Baruch Gouty RN, BSN Entered By: Baruch Gouty on 05/31/2020 10:03:57 -------------------------------------------------------------------------------- Wound Assessment Details Patient Name: Date of Service: [name] Foley F. 05/31/2020 8:45 A M Medical Record Number: [id number] Patient Account Number: [id number] Date of Birth/Sex: Treating RN: [birth year] (75 y.o. [name] [name], [name] [name] Primary Care Klohe Lovering: [name], [name] Other Clinician: Referring Yolinda Duerr: Treating Wallace Gappa/Extender: [name], [name] [name], [name] Weeks in Treatment: 215 Wound Status Wound Number: 30 Primary Diabetic Wound/Ulcer of the Lower Extremity Etiology: Wound Location: Left, Anterior Lower Leg Wound Healed - Epithelialized Wounding Event: Gradually Appeared Status: Date Acquired: 04/26/2020 Comorbid Anemia, Sleep Apnea, Arrhythmia, Congestive Heart Failure, Weeks Of Treatment: 5 History: Coronary Artery Disease, Hypertension, Myocardial Infarction, Clustered Wound: No Type II Diabetes, Gout, Neuropathy Wound  Measurements Length: (cm) Width: (cm) Depth: (cm) Area: (cm) Volume: (cm) 0 % Reduction in Area: 100% 0 % Reduction in Volume: 100% 0 Epithelialization: Large (67-100%) 0 Tunneling: No 0 Undermining: No Wound Description Classification: Grade 1 Wound Margin: Flat and Intact Exudate Amount: None Present Foul Odor After Cleansing: No Slough/Fibrino No Wound Bed Granulation Amount: None Present (0%) Exposed Structure Necrotic Amount: None Present (0%) Fascia Exposed: No Fat Layer (Subcutaneous Tissue) Exposed: No Tendon Exposed: No Muscle Exposed: No Joint Exposed: No Bone Exposed: No Electronic Signature(s) Signed: 05/31/2020 5:05:22 PM By: [name] Signed: 05/31/2020 5:13:50 PM By: Baruch Gouty RN, BSN Entered By: Baruch Gouty on 05/31/2020 10:04:27 -------------------------------------------------------------------------------- Wound Assessment Details Patient Name: Date of Service: [name] Foley F. 05/31/2020 8:45 A M Medical Record Number: [id number] Patient Account Number: [id number] Date of Birth/Sex: Treating RN: 06/07/1945 (75 y.o. [name] [name] Primary Care Lyndon Chenoweth: [name], [name] Other Clinician: Referring Yari Szeliga: Treating Doniqua Saxby/Extender: [name], [name] [name], [name] Weeks in Treatment: 215 Wound Status Wound Number: 31 Primary Abrasion Etiology: Wound Location: Right Knee Wound Open Wounding Event: Gradually Appeared  directed. Coban Self-Adherent Wrap 4x5 (in/yd) Discharge Instruction: Apply over Kerlix as directed. Compression Stockings Add-Ons Electronic Signature(s) Signed: 05/31/2020 5:05:22 PM By: [name] Signed: 05/31/2020 5:13:50 PM By: Baruch Gouty RN, BSN Signed: 05/31/2020 5:13:50 PM By: Baruch Gouty RN, BSN Entered By: Baruch Gouty on 05/31/2020 10:05:39 -------------------------------------------------------------------------------- Wound Assessment Details Patient Name: Date of Service: [name] Foley F. 05/31/2020 8:45 A M Medical Record Number: [id number] Patient Account Number: [id number] Date of Birth/Sex: Treating RN: 21-Nov-1945 (75 y.o. [name] [name], [name] [name] Primary Care Kristiane Morsch: [name], [name] Other Clinician: Referring Shital Crayton: Treating Saki Legore/Extender: [name], [name] [name], [name] Weeks in Treatment: 215 Wound Status Wound Number: 33 Primary Diabetic Wound/Ulcer of the Lower Extremity Etiology: Wound Location: Right, Distal, Dorsal Foot Wound Open Wounding Event: Gradually Appeared Status: Date Acquired: 05/17/2020 Comorbid Anemia, Sleep Apnea, Arrhythmia, Congestive Heart Failure, Weeks Of Treatment: 2 History: Coronary Artery Disease, Hypertension, Myocardial Infarction, Clustered Wound: No Type II Diabetes, Gout, Neuropathy Wound Measurements Length: (cm) 0.4 Width: (cm) 0.4 Depth: (cm) 0.1 Area: (cm) 0.126 Volume: (cm) 0.013 % Reduction in Area: 0% % Reduction in Volume: 0% Epithelialization: Medium (34-66%) Tunneling: No Undermining: No Wound Description Classification: Grade 1 Wound Margin: Flat and Intact Exudate Amount: Small Exudate Type: Serous Exudate Color: amber Foul Odor After Cleansing: No Slough/Fibrino Yes Wound Bed Granulation  Amount: Medium (34-66%) Exposed Structure Granulation Quality: Pink Fascia Exposed: No Necrotic Amount: Medium (34-66%) Fat Layer (Subcutaneous Tissue) Exposed: Yes Necrotic Quality: Adherent Slough Tendon Exposed: No Muscle Exposed: No Joint Exposed: No Bone Exposed: No Treatment Notes Wound #33 (Foot) Wound Laterality: Dorsal, Right, Distal Cleanser Peri-Wound Care Topical Primary Dressing Secondary Dressing Secured With Compression Wrap Compression Stockings Add-Ons Electronic Signature(s) Signed: 05/31/2020 5:05:22 PM By: [name] Signed: 05/31/2020 5:13:50 PM By: Baruch Gouty RN, BSN Entered By: Baruch Gouty on 05/31/2020 10:06:21 -------------------------------------------------------------------------------- Vitals Details Patient Name: Date of Service: [name] Foley F. 05/31/2020 8:45 A M Medical Record Number: [id number] Patient Account Number: [id number] Date of Birth/Sex: Treating RN: Aug 09, 1945 (75 y.o. [name] [name], [name] [name] Primary Care Prem Coykendall: [name], [name] Other Clinician: Referring Zaccheaus Storlie: Treating Vernette Moise/Extender: [name], [name] [name], [name] Weeks in Treatment: 215 Vital Signs Time Taken: 09:25 Temperature (F): 98.5 Height (in): 74 Pulse (bpm): 76 Weight (lbs): 150 Respiratory Rate (breaths/min): 18 Body Mass Index (BMI): 19.3 Blood Pressure (mmHg): 104/64 Capillary Blood Glucose (mg/dl): 121 Reference Range: 80 - 120 mg / dl Electronic Signature(s) Signed: 05/31/2020 10:38:58 AM By: Sandre Kitty Entered By: Sandre Kitty on 05/31/2020 09:28:00  in/yd) Discharge Instruction: Apply over Kerlix as directed. Compression Stockings Add-Ons Electronic Signature(s) Signed: 05/31/2020 5:05:22 PM By: [name] Signed: 05/31/2020 5:13:50 PM By: Baruch Gouty RN, BSN Entered By: Baruch Gouty on 05/31/2020 10:01:18 -------------------------------------------------------------------------------- Wound Assessment Details Patient Name: Date of Service: [name] Foley F. 05/31/2020 8:45 A M Medical Record Number: [id number] Patient Account Number: [id number] Date of Birth/Sex: Treating RN: [birth year] (75 y.o. [name] [name], [name] [name] Primary Care Taaliyah Delpriore: [name], [name] Other Clinician: Referring Josia Cueva: Treating Teressa Mcglocklin/Extender: [name], [name] [name], [name] Weeks in Treatment: 215 Wound Status Wound Number: 25 Primary Venous Leg  Ulcer Etiology: Wound Location: Right, Lateral Lower Leg Wound Open Wounding Event: Gradually Appeared Status: Date Acquired: 07/06/2019 Comorbid Anemia, Sleep Apnea, Arrhythmia, Congestive Heart Failure, Weeks Of Treatment: 45 History: Coronary Artery Disease, Hypertension, Myocardial Infarction, Clustered Wound: No Type II Diabetes, Gout, Neuropathy Wound Measurements Length: (cm) 2.5 Width: (cm) 1.2 Depth: (cm) 0.1 Area: (cm) 2.356 Volume: (cm) 0.236 % Reduction in Area: 13.8% % Reduction in Volume: 13.6% Epithelialization: Medium (34-66%) Tunneling: No Undermining: No Wound Description Classification: Full Thickness Without Exposed Support Structures Wound Margin: Flat and Intact Exudate Amount: Medium Exudate Type: Serosanguineous Exudate Color: red, brown Foul Odor After Cleansing: No Slough/Fibrino No Wound Bed Granulation Amount: Large (67-100%) Exposed Structure Granulation Quality: Red Fascia Exposed: No Necrotic Amount: Small (1-33%) Fat Layer (Subcutaneous Tissue) Exposed: Yes Necrotic Quality: Adherent Slough Tendon Exposed: No Muscle Exposed: No Joint Exposed: No Bone Exposed: No Treatment Notes Wound #25 (Lower Leg) Wound Laterality: Right, Lateral Cleanser Soap and Water Discharge Instruction: May shower and wash wound with dial antibacterial soap and water prior to dressing change. Peri-Wound Care Sween Lotion (Moisturizing lotion) Discharge Instruction: Apply moisturizing lotion as directed Topical Primary Dressing KerraCel Ag Gelling Fiber Dressing, 4x5 in (silver alginate) Discharge Instruction: Apply silver alginate to wound bed as instructed Secondary Dressing Woven Gauze Sponge, Non-Sterile 4x4 in Discharge Instruction: Apply over primary dressing as directed. ABD Pad, 5x9 Discharge Instruction: Apply over primary dressing as directed. Secured With Compression Wrap Kerlix Roll 4.5x3.1 (in/yd) Discharge Instruction: Apply Kerlix and  Coban compression as directed. Coban Self-Adherent Wrap 4x5 (in/yd) Discharge Instruction: Apply over Kerlix as directed. Compression Stockings Add-Ons Electronic Signature(s) Signed: 05/31/2020 5:05:22 PM By: [name] Signed: 05/31/2020 5:13:50 PM By: Baruch Gouty RN, BSN Entered By: Baruch Gouty on 05/31/2020 10:01:54 -------------------------------------------------------------------------------- Wound Assessment Details Patient Name: Date of Service: [name] Foley F. 05/31/2020 8:45 A M Medical Record Number: [id number] Patient Account Number: [id number] Date of Birth/Sex: Treating RN: June 29, 1945 (75 y.o. [name] [name], [name] [name] Primary Care Ladislaus Repsher: [name], [name] Other Clinician: Referring Fronie Holstein: Treating Amauria Younts/Extender: [name], [name] [name], [name] Weeks in Treatment: 215 Wound Status Wound Number: 26 Primary Diabetic Wound/Ulcer of the Lower Extremity Etiology: Wound Location: Left, Dorsal Foot Wound Open Wounding Event: Gradually Appeared Status: Date Acquired: 08/18/2019 Comorbid Anemia, Sleep Apnea, Arrhythmia, Congestive Heart Failure, Weeks Of Treatment: 41 History: Coronary Artery Disease, Hypertension, Myocardial Infarction, Clustered Wound: Yes Type II Diabetes, Gout, Neuropathy Wound Measurements Length: (cm) 3.5 Width: (cm) 1.3 Depth: (cm) 0.1 Clustered Quantity: 5 Area: (cm) 3.574 Volume: (cm) 0.357 % Reduction in Area: -549.8% % Reduction in Volume: -549.1% Epithelialization: None Tunneling: No Undermining: No Wound Description Classification: Grade 2 Wound Margin: Flat and Intact Exudate Amount: Medium Exudate Type: Serosanguineous Exudate Color: red, brown Foul Odor After Cleansing: No Slough/Fibrino Yes Wound Bed Granulation Amount: Small (1-33%) Exposed Structure Granulation Quality: Red, Pink Fascia Exposed: No Necrotic Amount: Large (67-100%) Fat Layer (Subcutaneous  directed. Coban Self-Adherent Wrap 4x5 (in/yd) Discharge Instruction: Apply over Kerlix as directed. Compression Stockings Add-Ons Electronic Signature(s) Signed: 05/31/2020 5:05:22 PM By: [name] Signed: 05/31/2020 5:13:50 PM By: Baruch Gouty RN, BSN Signed: 05/31/2020 5:13:50 PM By: Baruch Gouty RN, BSN Entered By: Baruch Gouty on 05/31/2020 10:05:39 -------------------------------------------------------------------------------- Wound Assessment Details Patient Name: Date of Service: [name] Foley F. 05/31/2020 8:45 A M Medical Record Number: [id number] Patient Account Number: [id number] Date of Birth/Sex: Treating RN: 21-Nov-1945 (75 y.o. [name] [name], [name] [name] Primary Care Kristiane Morsch: [name], [name] Other Clinician: Referring Shital Crayton: Treating Saki Legore/Extender: [name], [name] [name], [name] Weeks in Treatment: 215 Wound Status Wound Number: 33 Primary Diabetic Wound/Ulcer of the Lower Extremity Etiology: Wound Location: Right, Distal, Dorsal Foot Wound Open Wounding Event: Gradually Appeared Status: Date Acquired: 05/17/2020 Comorbid Anemia, Sleep Apnea, Arrhythmia, Congestive Heart Failure, Weeks Of Treatment: 2 History: Coronary Artery Disease, Hypertension, Myocardial Infarction, Clustered Wound: No Type II Diabetes, Gout, Neuropathy Wound Measurements Length: (cm) 0.4 Width: (cm) 0.4 Depth: (cm) 0.1 Area: (cm) 0.126 Volume: (cm) 0.013 % Reduction in Area: 0% % Reduction in Volume: 0% Epithelialization: Medium (34-66%) Tunneling: No Undermining: No Wound Description Classification: Grade 1 Wound Margin: Flat and Intact Exudate Amount: Small Exudate Type: Serous Exudate Color: amber Foul Odor After Cleansing: No Slough/Fibrino Yes Wound Bed Granulation  Amount: Medium (34-66%) Exposed Structure Granulation Quality: Pink Fascia Exposed: No Necrotic Amount: Medium (34-66%) Fat Layer (Subcutaneous Tissue) Exposed: Yes Necrotic Quality: Adherent Slough Tendon Exposed: No Muscle Exposed: No Joint Exposed: No Bone Exposed: No Treatment Notes Wound #33 (Foot) Wound Laterality: Dorsal, Right, Distal Cleanser Peri-Wound Care Topical Primary Dressing Secondary Dressing Secured With Compression Wrap Compression Stockings Add-Ons Electronic Signature(s) Signed: 05/31/2020 5:05:22 PM By: [name] Signed: 05/31/2020 5:13:50 PM By: Baruch Gouty RN, BSN Entered By: Baruch Gouty on 05/31/2020 10:06:21 -------------------------------------------------------------------------------- Vitals Details Patient Name: Date of Service: [name] Foley F. 05/31/2020 8:45 A M Medical Record Number: [id number] Patient Account Number: [id number] Date of Birth/Sex: Treating RN: Aug 09, 1945 (75 y.o. [name] [name], [name] [name] Primary Care Prem Coykendall: [name], [name] Other Clinician: Referring Zaccheaus Storlie: Treating Vernette Moise/Extender: [name], [name] [name], [name] Weeks in Treatment: 215 Vital Signs Time Taken: 09:25 Temperature (F): 98.5 Height (in): 74 Pulse (bpm): 76 Weight (lbs): 150 Respiratory Rate (breaths/min): 18 Body Mass Index (BMI): 19.3 Blood Pressure (mmHg): 104/64 Capillary Blood Glucose (mg/dl): 121 Reference Range: 80 - 120 mg / dl Electronic Signature(s) Signed: 05/31/2020 10:38:58 AM By: Sandre Kitty Entered By: Sandre Kitty on 05/31/2020 09:28:00  in/yd) Discharge Instruction: Apply over Kerlix as directed. Compression Stockings Add-Ons Electronic Signature(s) Signed: 05/31/2020 5:05:22 PM By: [name] Signed: 05/31/2020 5:13:50 PM By: Baruch Gouty RN, BSN Entered By: Baruch Gouty on 05/31/2020 10:01:18 -------------------------------------------------------------------------------- Wound Assessment Details Patient Name: Date of Service: [name] Foley F. 05/31/2020 8:45 A M Medical Record Number: [id number] Patient Account Number: [id number] Date of Birth/Sex: Treating RN: [birth year] (75 y.o. [name] [name], [name] [name] Primary Care Taaliyah Delpriore: [name], [name] Other Clinician: Referring Josia Cueva: Treating Teressa Mcglocklin/Extender: [name], [name] [name], [name] Weeks in Treatment: 215 Wound Status Wound Number: 25 Primary Venous Leg  Ulcer Etiology: Wound Location: Right, Lateral Lower Leg Wound Open Wounding Event: Gradually Appeared Status: Date Acquired: 07/06/2019 Comorbid Anemia, Sleep Apnea, Arrhythmia, Congestive Heart Failure, Weeks Of Treatment: 45 History: Coronary Artery Disease, Hypertension, Myocardial Infarction, Clustered Wound: No Type II Diabetes, Gout, Neuropathy Wound Measurements Length: (cm) 2.5 Width: (cm) 1.2 Depth: (cm) 0.1 Area: (cm) 2.356 Volume: (cm) 0.236 % Reduction in Area: 13.8% % Reduction in Volume: 13.6% Epithelialization: Medium (34-66%) Tunneling: No Undermining: No Wound Description Classification: Full Thickness Without Exposed Support Structures Wound Margin: Flat and Intact Exudate Amount: Medium Exudate Type: Serosanguineous Exudate Color: red, brown Foul Odor After Cleansing: No Slough/Fibrino No Wound Bed Granulation Amount: Large (67-100%) Exposed Structure Granulation Quality: Red Fascia Exposed: No Necrotic Amount: Small (1-33%) Fat Layer (Subcutaneous Tissue) Exposed: Yes Necrotic Quality: Adherent Slough Tendon Exposed: No Muscle Exposed: No Joint Exposed: No Bone Exposed: No Treatment Notes Wound #25 (Lower Leg) Wound Laterality: Right, Lateral Cleanser Soap and Water Discharge Instruction: May shower and wash wound with dial antibacterial soap and water prior to dressing change. Peri-Wound Care Sween Lotion (Moisturizing lotion) Discharge Instruction: Apply moisturizing lotion as directed Topical Primary Dressing KerraCel Ag Gelling Fiber Dressing, 4x5 in (silver alginate) Discharge Instruction: Apply silver alginate to wound bed as instructed Secondary Dressing Woven Gauze Sponge, Non-Sterile 4x4 in Discharge Instruction: Apply over primary dressing as directed. ABD Pad, 5x9 Discharge Instruction: Apply over primary dressing as directed. Secured With Compression Wrap Kerlix Roll 4.5x3.1 (in/yd) Discharge Instruction: Apply Kerlix and  Coban compression as directed. Coban Self-Adherent Wrap 4x5 (in/yd) Discharge Instruction: Apply over Kerlix as directed. Compression Stockings Add-Ons Electronic Signature(s) Signed: 05/31/2020 5:05:22 PM By: [name] Signed: 05/31/2020 5:13:50 PM By: Baruch Gouty RN, BSN Entered By: Baruch Gouty on 05/31/2020 10:01:54 -------------------------------------------------------------------------------- Wound Assessment Details Patient Name: Date of Service: [name] Foley F. 05/31/2020 8:45 A M Medical Record Number: [id number] Patient Account Number: [id number] Date of Birth/Sex: Treating RN: June 29, 1945 (75 y.o. [name] [name], [name] [name] Primary Care Ladislaus Repsher: [name], [name] Other Clinician: Referring Fronie Holstein: Treating Amauria Younts/Extender: [name], [name] [name], [name] Weeks in Treatment: 215 Wound Status Wound Number: 26 Primary Diabetic Wound/Ulcer of the Lower Extremity Etiology: Wound Location: Left, Dorsal Foot Wound Open Wounding Event: Gradually Appeared Status: Date Acquired: 08/18/2019 Comorbid Anemia, Sleep Apnea, Arrhythmia, Congestive Heart Failure, Weeks Of Treatment: 41 History: Coronary Artery Disease, Hypertension, Myocardial Infarction, Clustered Wound: Yes Type II Diabetes, Gout, Neuropathy Wound Measurements Length: (cm) 3.5 Width: (cm) 1.3 Depth: (cm) 0.1 Clustered Quantity: 5 Area: (cm) 3.574 Volume: (cm) 0.357 % Reduction in Area: -549.8% % Reduction in Volume: -549.1% Epithelialization: None Tunneling: No Undermining: No Wound Description Classification: Grade 2 Wound Margin: Flat and Intact Exudate Amount: Medium Exudate Type: Serosanguineous Exudate Color: red, brown Foul Odor After Cleansing: No Slough/Fibrino Yes Wound Bed Granulation Amount: Small (1-33%) Exposed Structure Granulation Quality: Red, Pink Fascia Exposed: No Necrotic Amount: Large (67-100%) Fat Layer (Subcutaneous  the Lower Extremity Etiology: Wound Location: Right Calcaneus Wound Open Wounding Event: Gradually Appeared Status: Date Acquired: 04/26/2020 Comorbid Anemia, Sleep Apnea, Arrhythmia, Congestive Heart Failure, Weeks Of Treatment: 5 History: Coronary Artery Disease, Hypertension, Myocardial Infarction, Clustered Wound: No Clustered Wound: No Type II Diabetes, Gout, Neuropathy Wound Measurements Length: (cm) 0.2 Width: (cm) 0.2 Depth: (cm) 0.2 Area: (cm) 0.031 Volume: (cm) 0.006 % Reduction in Area: 98.3% % Reduction in Volume: 96.7% Epithelialization: Large (67-100%) Tunneling: No Undermining: No Wound Description Classification: Grade 1 Wound Margin: Flat and Intact Exudate Amount: Small Exudate Type: Serous Exudate Color: amber Foul Odor After Cleansing: No Slough/Fibrino No Wound Bed Granulation Amount: Large (67-100%) Exposed Structure Granulation Quality: Pink Fascia Exposed: No Necrotic Amount: None Present (0%) Fat Layer (Subcutaneous Tissue) Exposed: No Tendon Exposed: No Muscle Exposed: No Joint Exposed: No Bone Exposed: No Treatment Notes Wound #29 (Calcaneus) Wound Laterality: Right Cleanser Soap and Water Discharge Instruction: May shower and wash wound with dial  antibacterial soap and water prior to dressing change. Peri-Wound Care Sween Lotion (Moisturizing lotion) Discharge Instruction: Apply moisturizing lotion as directed Topical Primary Dressing KerraCel Ag Gelling Fiber Dressing, 4x5 in (silver alginate) Discharge Instruction: Apply silver alginate to wound bed as instructed Secondary Dressing Woven Gauze Sponge, Non-Sterile 4x4 in Discharge Instruction: Apply over primary dressing as directed. ABD Pad, 5x9 Discharge Instruction: Apply over primary dressing as directed. Secured With Compression Wrap Kerlix Roll 4.5x3.1 (in/yd) Discharge Instruction: Apply Kerlix and Coban compression as directed. Coban Self-Adherent Wrap 4x5 (in/yd) Discharge Instruction: Apply over Kerlix as directed. Compression Stockings Add-Ons Electronic Signature(s) Signed: 05/31/2020 5:05:22 PM By: [name] Signed: 05/31/2020 5:13:50 PM By: Baruch Gouty RN, BSN Entered By: Baruch Gouty on 05/31/2020 10:03:57 -------------------------------------------------------------------------------- Wound Assessment Details Patient Name: Date of Service: [name] Foley F. 05/31/2020 8:45 A M Medical Record Number: [id number] Patient Account Number: [id number] Date of Birth/Sex: Treating RN: [birth year] (75 y.o. [name] [name], [name] [name] Primary Care Klohe Lovering: [name], [name] Other Clinician: Referring Yolinda Duerr: Treating Wallace Gappa/Extender: [name], [name] [name], [name] Weeks in Treatment: 215 Wound Status Wound Number: 30 Primary Diabetic Wound/Ulcer of the Lower Extremity Etiology: Wound Location: Left, Anterior Lower Leg Wound Healed - Epithelialized Wounding Event: Gradually Appeared Status: Date Acquired: 04/26/2020 Comorbid Anemia, Sleep Apnea, Arrhythmia, Congestive Heart Failure, Weeks Of Treatment: 5 History: Coronary Artery Disease, Hypertension, Myocardial Infarction, Clustered Wound: No Type II Diabetes, Gout, Neuropathy Wound  Measurements Length: (cm) Width: (cm) Depth: (cm) Area: (cm) Volume: (cm) 0 % Reduction in Area: 100% 0 % Reduction in Volume: 100% 0 Epithelialization: Large (67-100%) 0 Tunneling: No 0 Undermining: No Wound Description Classification: Grade 1 Wound Margin: Flat and Intact Exudate Amount: None Present Foul Odor After Cleansing: No Slough/Fibrino No Wound Bed Granulation Amount: None Present (0%) Exposed Structure Necrotic Amount: None Present (0%) Fascia Exposed: No Fat Layer (Subcutaneous Tissue) Exposed: No Tendon Exposed: No Muscle Exposed: No Joint Exposed: No Bone Exposed: No Electronic Signature(s) Signed: 05/31/2020 5:05:22 PM By: [name] Signed: 05/31/2020 5:13:50 PM By: Baruch Gouty RN, BSN Entered By: Baruch Gouty on 05/31/2020 10:04:27 -------------------------------------------------------------------------------- Wound Assessment Details Patient Name: Date of Service: [name] Foley F. 05/31/2020 8:45 A M Medical Record Number: [id number] Patient Account Number: [id number] Date of Birth/Sex: Treating RN: 06/07/1945 (75 y.o. [name] [name] Primary Care Lyndon Chenoweth: [name], [name] Other Clinician: Referring Yari Szeliga: Treating Doniqua Saxby/Extender: [name], [name] [name], [name] Weeks in Treatment: 215 Wound Status Wound Number: 31 Primary Abrasion Etiology: Wound Location: Right Knee Wound Open Wounding Event: Gradually Appeared  the Lower Extremity Etiology: Wound Location: Right Calcaneus Wound Open Wounding Event: Gradually Appeared Status: Date Acquired: 04/26/2020 Comorbid Anemia, Sleep Apnea, Arrhythmia, Congestive Heart Failure, Weeks Of Treatment: 5 History: Coronary Artery Disease, Hypertension, Myocardial Infarction, Clustered Wound: No Clustered Wound: No Type II Diabetes, Gout, Neuropathy Wound Measurements Length: (cm) 0.2 Width: (cm) 0.2 Depth: (cm) 0.2 Area: (cm) 0.031 Volume: (cm) 0.006 % Reduction in Area: 98.3% % Reduction in Volume: 96.7% Epithelialization: Large (67-100%) Tunneling: No Undermining: No Wound Description Classification: Grade 1 Wound Margin: Flat and Intact Exudate Amount: Small Exudate Type: Serous Exudate Color: amber Foul Odor After Cleansing: No Slough/Fibrino No Wound Bed Granulation Amount: Large (67-100%) Exposed Structure Granulation Quality: Pink Fascia Exposed: No Necrotic Amount: None Present (0%) Fat Layer (Subcutaneous Tissue) Exposed: No Tendon Exposed: No Muscle Exposed: No Joint Exposed: No Bone Exposed: No Treatment Notes Wound #29 (Calcaneus) Wound Laterality: Right Cleanser Soap and Water Discharge Instruction: May shower and wash wound with dial  antibacterial soap and water prior to dressing change. Peri-Wound Care Sween Lotion (Moisturizing lotion) Discharge Instruction: Apply moisturizing lotion as directed Topical Primary Dressing KerraCel Ag Gelling Fiber Dressing, 4x5 in (silver alginate) Discharge Instruction: Apply silver alginate to wound bed as instructed Secondary Dressing Woven Gauze Sponge, Non-Sterile 4x4 in Discharge Instruction: Apply over primary dressing as directed. ABD Pad, 5x9 Discharge Instruction: Apply over primary dressing as directed. Secured With Compression Wrap Kerlix Roll 4.5x3.1 (in/yd) Discharge Instruction: Apply Kerlix and Coban compression as directed. Coban Self-Adherent Wrap 4x5 (in/yd) Discharge Instruction: Apply over Kerlix as directed. Compression Stockings Add-Ons Electronic Signature(s) Signed: 05/31/2020 5:05:22 PM By: [name] Signed: 05/31/2020 5:13:50 PM By: Baruch Gouty RN, BSN Entered By: Baruch Gouty on 05/31/2020 10:03:57 -------------------------------------------------------------------------------- Wound Assessment Details Patient Name: Date of Service: [name] Foley F. 05/31/2020 8:45 A M Medical Record Number: [id number] Patient Account Number: [id number] Date of Birth/Sex: Treating RN: [birth year] (75 y.o. [name] [name], [name] [name] Primary Care Klohe Lovering: [name], [name] Other Clinician: Referring Yolinda Duerr: Treating Wallace Gappa/Extender: [name], [name] [name], [name] Weeks in Treatment: 215 Wound Status Wound Number: 30 Primary Diabetic Wound/Ulcer of the Lower Extremity Etiology: Wound Location: Left, Anterior Lower Leg Wound Healed - Epithelialized Wounding Event: Gradually Appeared Status: Date Acquired: 04/26/2020 Comorbid Anemia, Sleep Apnea, Arrhythmia, Congestive Heart Failure, Weeks Of Treatment: 5 History: Coronary Artery Disease, Hypertension, Myocardial Infarction, Clustered Wound: No Type II Diabetes, Gout, Neuropathy Wound  Measurements Length: (cm) Width: (cm) Depth: (cm) Area: (cm) Volume: (cm) 0 % Reduction in Area: 100% 0 % Reduction in Volume: 100% 0 Epithelialization: Large (67-100%) 0 Tunneling: No 0 Undermining: No Wound Description Classification: Grade 1 Wound Margin: Flat and Intact Exudate Amount: None Present Foul Odor After Cleansing: No Slough/Fibrino No Wound Bed Granulation Amount: None Present (0%) Exposed Structure Necrotic Amount: None Present (0%) Fascia Exposed: No Fat Layer (Subcutaneous Tissue) Exposed: No Tendon Exposed: No Muscle Exposed: No Joint Exposed: No Bone Exposed: No Electronic Signature(s) Signed: 05/31/2020 5:05:22 PM By: [name] Signed: 05/31/2020 5:13:50 PM By: Baruch Gouty RN, BSN Entered By: Baruch Gouty on 05/31/2020 10:04:27 -------------------------------------------------------------------------------- Wound Assessment Details Patient Name: Date of Service: [name] Foley F. 05/31/2020 8:45 A M Medical Record Number: [id number] Patient Account Number: [id number] Date of Birth/Sex: Treating RN: 06/07/1945 (75 y.o. [name] [name] Primary Care Lyndon Chenoweth: [name], [name] Other Clinician: Referring Yari Szeliga: Treating Doniqua Saxby/Extender: [name], [name] [name], [name] Weeks in Treatment: 215 Wound Status Wound Number: 31 Primary Abrasion Etiology: Wound Location: Right Knee Wound Open Wounding Event: Gradually Appeared  Tissue) Exposed: Yes Necrotic Quality:  Eschar, Adherent Slough Tendon Exposed: No Muscle Exposed: No Joint Exposed: No Bone Exposed: No Treatment Notes Wound #26 (Foot) Wound Laterality: Dorsal, Left Cleanser Soap and Water Discharge Instruction: May shower and wash wound with dial antibacterial soap and water prior to dressing change. Peri-Wound Care Sween Lotion (Moisturizing lotion) Discharge Instruction: Apply moisturizing lotion as directed Topical Primary Dressing Promogran Prisma Matrix, 4.34 (sq in) (silver collagen) Discharge Instruction: Moisten collagen with saline or hydrogel Secondary Dressing Woven Gauze Sponge, Non-Sterile 4x4 in Discharge Instruction: Apply over primary dressing as directed. ABD Pad, 5x9 Discharge Instruction: Apply over primary dressing as directed. Secured With Compression Wrap Kerlix Roll 4.5x3.1 (in/yd) Discharge Instruction: Apply Kerlix and Coban compression as directed. Coban Self-Adherent Wrap 4x5 (in/yd) Discharge Instruction: Apply over Kerlix as directed. Compression Stockings Add-Ons Electronic Signature(s) Signed: 05/31/2020 5:05:22 PM By: [name] Signed: 05/31/2020 5:13:50 PM By: Baruch Gouty RN, BSN Entered By: Baruch Gouty on 05/31/2020 10:02:50 -------------------------------------------------------------------------------- Wound Assessment Details Patient Name: Date of Service: [name] Foley F. 05/31/2020 8:45 A M Medical Record Number: [id number] Patient Account Number: [id number] Date of Birth/Sex: Treating RN: 15-Apr-1946 (75 y.o. [name] [name], [name] [name] Primary Care Nerissa Constantin: [name], [name] Other Clinician: Referring Shaylynne Lunt: Treating Makya Phillis/Extender: [name], [name] [name], [name] Weeks in Treatment: 215 Wound Status Wound Number: 28 Primary Diabetic Wound/Ulcer of the Lower Extremity Etiology: Wound Location: Right, Dorsal Foot Wound Open Wounding Event: Gradually Appeared Status: Date Acquired: 04/26/2020 Comorbid Anemia, Sleep  Apnea, Arrhythmia, Congestive Heart Failure, Weeks Of Treatment: 5 History: Coronary Artery Disease, Hypertension, Myocardial Infarction, Clustered Wound: Yes Type II Diabetes, Gout, Neuropathy Wound Measurements Length: (cm) 0.7 Width: (cm) 0.6 Depth: (cm) 0.1 Clustered Quantity: 2 Area: (cm) 0.33 Volume: (cm) 0.033 % Reduction in Area: 68.2% % Reduction in Volume: 68.3% Epithelialization: Small (1-33%) Tunneling: No Undermining: No Wound Description Classification: Grade 1 Wound Margin: Flat and Intact Exudate Amount: Small Exudate Type: Serosanguineous Exudate Color: red, brown Wound Bed Granulation Amount: Small (1-33%) Granulation Quality: Pink Necrotic Amount: Large (67-100%) Necrotic Quality: Adherent Slough Foul Odor After Cleansing: No Slough/Fibrino Yes Exposed Structure Fascia Exposed: No Fat Layer (Subcutaneous Tissue) Exposed: Yes Tendon Exposed: No Muscle Exposed: No Joint Exposed: No Bone Exposed: No Treatment Notes Wound #28 (Foot) Wound Laterality: Dorsal, Right Cleanser Soap and Water Discharge Instruction: May shower and wash wound with dial antibacterial soap and water prior to dressing change. Peri-Wound Care Sween Lotion (Moisturizing lotion) Discharge Instruction: Apply moisturizing lotion as directed Topical Primary Dressing KerraCel Ag Gelling Fiber Dressing, 4x5 in (silver alginate) Discharge Instruction: Apply silver alginate to wound bed as instructed Secondary Dressing Woven Gauze Sponge, Non-Sterile 4x4 in Discharge Instruction: Apply over primary dressing as directed. ABD Pad, 5x9 Discharge Instruction: Apply over primary dressing as directed. Secured With Compression Wrap Kerlix Roll 4.5x3.1 (in/yd) Discharge Instruction: Apply Kerlix and Coban compression as directed. Coban Self-Adherent Wrap 4x5 (in/yd) Discharge Instruction: Apply over Kerlix as directed. Compression Stockings Add-Ons Electronic Signature(s) Signed:  05/31/2020 5:05:22 PM By: [name] Signed: 05/31/2020 5:13:50 PM By: Baruch Gouty RN, BSN Entered By: Baruch Gouty on 05/31/2020 10:03:21 -------------------------------------------------------------------------------- Wound Assessment Details Patient Name: Date of Service: [name] Foley F. 05/31/2020 8:45 A M Medical Record Number: [id number] Patient Account Number: [id number] Date of Birth/Sex: Treating RN: [birth year] (75 y.o. [name] [name] Primary Care Lochlin Eppinger: [name], [name] Other Clinician: Referring Krisna Omar: Treating Marcelles Clinard/Extender: [name], [name] [name], [name] Weeks in Treatment: 215 Wound Status Wound Number: 29 Primary Diabetic Wound/Ulcer of  in/yd) Discharge Instruction: Apply over Kerlix as directed. Compression Stockings Add-Ons Electronic Signature(s) Signed: 05/31/2020 5:05:22 PM By: [name] Signed: 05/31/2020 5:13:50 PM By: Baruch Gouty RN, BSN Entered By: Baruch Gouty on 05/31/2020 10:01:18 -------------------------------------------------------------------------------- Wound Assessment Details Patient Name: Date of Service: [name] Foley F. 05/31/2020 8:45 A M Medical Record Number: [id number] Patient Account Number: [id number] Date of Birth/Sex: Treating RN: [birth year] (75 y.o. [name] [name], [name] [name] Primary Care Taaliyah Delpriore: [name], [name] Other Clinician: Referring Josia Cueva: Treating Teressa Mcglocklin/Extender: [name], [name] [name], [name] Weeks in Treatment: 215 Wound Status Wound Number: 25 Primary Venous Leg  Ulcer Etiology: Wound Location: Right, Lateral Lower Leg Wound Open Wounding Event: Gradually Appeared Status: Date Acquired: 07/06/2019 Comorbid Anemia, Sleep Apnea, Arrhythmia, Congestive Heart Failure, Weeks Of Treatment: 45 History: Coronary Artery Disease, Hypertension, Myocardial Infarction, Clustered Wound: No Type II Diabetes, Gout, Neuropathy Wound Measurements Length: (cm) 2.5 Width: (cm) 1.2 Depth: (cm) 0.1 Area: (cm) 2.356 Volume: (cm) 0.236 % Reduction in Area: 13.8% % Reduction in Volume: 13.6% Epithelialization: Medium (34-66%) Tunneling: No Undermining: No Wound Description Classification: Full Thickness Without Exposed Support Structures Wound Margin: Flat and Intact Exudate Amount: Medium Exudate Type: Serosanguineous Exudate Color: red, brown Foul Odor After Cleansing: No Slough/Fibrino No Wound Bed Granulation Amount: Large (67-100%) Exposed Structure Granulation Quality: Red Fascia Exposed: No Necrotic Amount: Small (1-33%) Fat Layer (Subcutaneous Tissue) Exposed: Yes Necrotic Quality: Adherent Slough Tendon Exposed: No Muscle Exposed: No Joint Exposed: No Bone Exposed: No Treatment Notes Wound #25 (Lower Leg) Wound Laterality: Right, Lateral Cleanser Soap and Water Discharge Instruction: May shower and wash wound with dial antibacterial soap and water prior to dressing change. Peri-Wound Care Sween Lotion (Moisturizing lotion) Discharge Instruction: Apply moisturizing lotion as directed Topical Primary Dressing KerraCel Ag Gelling Fiber Dressing, 4x5 in (silver alginate) Discharge Instruction: Apply silver alginate to wound bed as instructed Secondary Dressing Woven Gauze Sponge, Non-Sterile 4x4 in Discharge Instruction: Apply over primary dressing as directed. ABD Pad, 5x9 Discharge Instruction: Apply over primary dressing as directed. Secured With Compression Wrap Kerlix Roll 4.5x3.1 (in/yd) Discharge Instruction: Apply Kerlix and  Coban compression as directed. Coban Self-Adherent Wrap 4x5 (in/yd) Discharge Instruction: Apply over Kerlix as directed. Compression Stockings Add-Ons Electronic Signature(s) Signed: 05/31/2020 5:05:22 PM By: [name] Signed: 05/31/2020 5:13:50 PM By: Baruch Gouty RN, BSN Entered By: Baruch Gouty on 05/31/2020 10:01:54 -------------------------------------------------------------------------------- Wound Assessment Details Patient Name: Date of Service: [name] Foley F. 05/31/2020 8:45 A M Medical Record Number: [id number] Patient Account Number: [id number] Date of Birth/Sex: Treating RN: June 29, 1945 (75 y.o. [name] [name], [name] [name] Primary Care Ladislaus Repsher: [name], [name] Other Clinician: Referring Fronie Holstein: Treating Amauria Younts/Extender: [name], [name] [name], [name] Weeks in Treatment: 215 Wound Status Wound Number: 26 Primary Diabetic Wound/Ulcer of the Lower Extremity Etiology: Wound Location: Left, Dorsal Foot Wound Open Wounding Event: Gradually Appeared Status: Date Acquired: 08/18/2019 Comorbid Anemia, Sleep Apnea, Arrhythmia, Congestive Heart Failure, Weeks Of Treatment: 41 History: Coronary Artery Disease, Hypertension, Myocardial Infarction, Clustered Wound: Yes Type II Diabetes, Gout, Neuropathy Wound Measurements Length: (cm) 3.5 Width: (cm) 1.3 Depth: (cm) 0.1 Clustered Quantity: 5 Area: (cm) 3.574 Volume: (cm) 0.357 % Reduction in Area: -549.8% % Reduction in Volume: -549.1% Epithelialization: None Tunneling: No Undermining: No Wound Description Classification: Grade 2 Wound Margin: Flat and Intact Exudate Amount: Medium Exudate Type: Serosanguineous Exudate Color: red, brown Foul Odor After Cleansing: No Slough/Fibrino Yes Wound Bed Granulation Amount: Small (1-33%) Exposed Structure Granulation Quality: Red, Pink Fascia Exposed: No Necrotic Amount: Large (67-100%) Fat Layer (Subcutaneous

## 2020-06-04 ENCOUNTER — Emergency Department (HOSPITAL_COMMUNITY)
Admission: EM | Admit: 2020-06-04 | Discharge: 2020-06-05 | Disposition: A | Discharge status: Home / Self Care | Payer: Medicare HMO | Attending: Emergency Medicine | Admitting: Emergency Medicine

## 2020-06-04 ENCOUNTER — Other Ambulatory Visit: Payer: Self-pay

## 2020-06-04 ENCOUNTER — Encounter (HOSPITAL_COMMUNITY): Payer: Self-pay | Admitting: Emergency Medicine

## 2020-06-04 DIAGNOSIS — Z794 Long term (current) use of insulin: Secondary | ICD-10-CM | POA: Insufficient documentation

## 2020-06-04 DIAGNOSIS — E1122 Type 2 diabetes mellitus with diabetic chronic kidney disease: Secondary | ICD-10-CM | POA: Insufficient documentation

## 2020-06-04 DIAGNOSIS — N183 Chronic kidney disease, stage 3 unspecified: Secondary | ICD-10-CM | POA: Diagnosis not present

## 2020-06-04 DIAGNOSIS — I672 Cerebral atherosclerosis: Secondary | ICD-10-CM | POA: Diagnosis not present

## 2020-06-04 DIAGNOSIS — I2510 Atherosclerotic heart disease of native coronary artery without angina pectoris: Secondary | ICD-10-CM | POA: Diagnosis not present

## 2020-06-04 DIAGNOSIS — E1149 Type 2 diabetes mellitus with other diabetic neurological complication: Secondary | ICD-10-CM | POA: Diagnosis not present

## 2020-06-04 DIAGNOSIS — Z8673 Personal history of transient ischemic attack (TIA), and cerebral infarction without residual deficits: Secondary | ICD-10-CM | POA: Insufficient documentation

## 2020-06-04 DIAGNOSIS — I447 Left bundle-branch block, unspecified: Secondary | ICD-10-CM | POA: Diagnosis not present

## 2020-06-04 DIAGNOSIS — E161 Other hypoglycemia: Secondary | ICD-10-CM | POA: Diagnosis not present

## 2020-06-04 DIAGNOSIS — E162 Hypoglycemia, unspecified: Secondary | ICD-10-CM | POA: Diagnosis not present

## 2020-06-04 DIAGNOSIS — I4430 Unspecified atrioventricular block: Secondary | ICD-10-CM | POA: Diagnosis not present

## 2020-06-04 DIAGNOSIS — Z7902 Long term (current) use of antithrombotics/antiplatelets: Secondary | ICD-10-CM | POA: Diagnosis not present

## 2020-06-04 DIAGNOSIS — E1121 Type 2 diabetes mellitus with diabetic nephropathy: Secondary | ICD-10-CM | POA: Diagnosis not present

## 2020-06-04 DIAGNOSIS — E1142 Type 2 diabetes mellitus with diabetic polyneuropathy: Secondary | ICD-10-CM | POA: Diagnosis not present

## 2020-06-04 DIAGNOSIS — Z79899 Other long term (current) drug therapy: Secondary | ICD-10-CM | POA: Diagnosis not present

## 2020-06-04 DIAGNOSIS — G319 Degenerative disease of nervous system, unspecified: Secondary | ICD-10-CM | POA: Diagnosis not present

## 2020-06-04 DIAGNOSIS — I130 Hypertensive heart and chronic kidney disease with heart failure and stage 1 through stage 4 chronic kidney disease, or unspecified chronic kidney disease: Secondary | ICD-10-CM | POA: Diagnosis not present

## 2020-06-04 DIAGNOSIS — I5042 Chronic combined systolic (congestive) and diastolic (congestive) heart failure: Secondary | ICD-10-CM | POA: Insufficient documentation

## 2020-06-04 DIAGNOSIS — G9341 Metabolic encephalopathy: Secondary | ICD-10-CM | POA: Diagnosis not present

## 2020-06-04 DIAGNOSIS — I6782 Cerebral ischemia: Secondary | ICD-10-CM | POA: Diagnosis not present

## 2020-06-04 DIAGNOSIS — R29818 Other symptoms and signs involving the nervous system: Secondary | ICD-10-CM | POA: Diagnosis not present

## 2020-06-04 DIAGNOSIS — Z7982 Long term (current) use of aspirin: Secondary | ICD-10-CM | POA: Diagnosis not present

## 2020-06-04 DIAGNOSIS — E11649 Type 2 diabetes mellitus with hypoglycemia without coma: Secondary | ICD-10-CM | POA: Diagnosis not present

## 2020-06-04 DIAGNOSIS — R404 Transient alteration of awareness: Secondary | ICD-10-CM | POA: Diagnosis not present

## 2020-06-04 LAB — BASIC METABOLIC PANEL
Anion gap: 8 (ref 5–15)
BUN: 24 mg/dL — ABNORMAL HIGH (ref 8–23)
CO2: 25 mmol/L (ref 22–32)
Calcium: 7.8 mg/dL — ABNORMAL LOW (ref 8.9–10.3)
Chloride: 103 mmol/L (ref 98–111)
Creatinine, Ser: 1.99 mg/dL — ABNORMAL HIGH (ref 0.61–1.24)
GFR, Estimated: 35 mL/min — ABNORMAL LOW (ref >60)
Glucose, Bld: 135 mg/dL — ABNORMAL HIGH (ref 70–99)
Potassium: 4 mmol/L (ref 3.5–5.1)
Sodium: 136 mmol/L (ref 135–145)

## 2020-06-04 LAB — CBC
HCT: 39.3 % (ref 39.0–52.0)
Hemoglobin: 12.1 g/dL — ABNORMAL LOW (ref 13.0–17.0)
MCH: 27.4 pg (ref 26.0–34.0)
MCHC: 30.8 g/dL (ref 30.0–36.0)
MCV: 89.1 fL (ref 80.0–100.0)
Platelets: 290 10*3/uL (ref 150–400)
RBC: 4.41 MIL/uL (ref 4.22–5.81)
RDW: 13.9 % (ref 11.5–15.5)
WBC: 13.5 10*3/uL — ABNORMAL HIGH (ref 4.0–10.5)
nRBC: 0 % (ref 0.0–0.2)

## 2020-06-04 LAB — CBG MONITORING, ED
Glucose-Capillary: 107 mg/dL — ABNORMAL HIGH (ref 70–99)
Glucose-Capillary: 148 mg/dL — ABNORMAL HIGH (ref 70–99)

## 2020-06-04 MED ORDER — SODIUM CHLORIDE 0.9 % IV SOLN: 250.0000 mL | INTRAVENOUS | Status: DC | PRN

## 2020-06-04 MED ORDER — SODIUM CHLORIDE 0.9% FLUSH: 3.0000 mL | INTRAVENOUS | Status: DC | PRN

## 2020-06-04 MED ORDER — SODIUM CHLORIDE 0.9% FLUSH
3.0000 mL | Freq: Two times a day (BID) | INTRAVENOUS | Status: DC
  Administered 2020-06-04: 3 mL via INTRAVENOUS

## 2020-06-04 NOTE — Discharge Instructions (Addendum)
You were evaluated in the Emergency Department and after careful evaluation, we did not find any emergent condition requiring admission or further testing in the hospital.  Your exam/testing today was overall reassuring.  Your symptoms seem to be due to low blood sugar.  Please use caution with your insulin until you discuss your symptoms with your primary care doctor.  Please return to the Emergency Department if you experience any worsening of your condition.  Thank you for allowing [place] to be a part of your care.

## 2020-06-04 NOTE — ED Triage Notes (Signed)
Patient from home, family reports that his sugars have been normal all day, patient ate dinner and was given 10u of Novalog and head went back.  Patient was found to have cbg of 51.  EMS gave 25g of D10 and now cbg of 161.  Patient is now CAOx4.
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
## 2020-06-04 NOTE — ED Provider Notes (Addendum)
  Provider Note MRN:  [id number]  Arrival date & time: 06/04/20    ED Course and Medical Decision Making  Assumed care from Dr. Tomi Bamberger at shift change.  Hypoglycemia monitoring for recurrence, anticipating dc.  Monitored for 2 hours with no symptoms, repeat glucose is reassuring, appropriate for discharge.  Addendum 1 AM: Patient's daughter arrived and was uncomfortable with discharge plan.  She made note of patient with persistent leaning to the right ever since he was brought home from the hospital.  On my exam he has weakness to bilateral legs right greater than left which is his baseline.  His arms do not appear to have focal weakness, no facial droop.  We discussed the possibilities of his leaning, favoring more of a strength or deconditioning cause.  Decision made to repeat CT head to ensure no significant changes.  CT is stable, appropriate for discharge possibly with PT at home to help with patient's new posture issues.  Procedures  Final Clinical Impressions(s) / ED Diagnoses     ICD-10-CM   1. Hypoglycemia  E16.2     ED Discharge Orders    None        Discharge Instructions     You were evaluated in the Emergency Department and after careful evaluation, we did not find any emergent condition requiring admission or further testing in the hospital.  Your exam/testing today was overall reassuring.  Your symptoms seem to be due to low blood sugar.  Please use caution with your insulin until you discuss your symptoms with your primary care doctor.  Please return to the Emergency Department if you experience any worsening of your condition.  Thank you for allowing [place] to be a part of your care.      Barth Kirks. Sedonia Small, [place] [email or web address]    Maudie Flakes, MD 06/04/20 2344    Maudie Flakes, MD 06/05/20 [phone]

## 2020-06-05 ENCOUNTER — Emergency Department (HOSPITAL_COMMUNITY): Payer: Medicare HMO

## 2020-06-05 DIAGNOSIS — I6782 Cerebral ischemia: Secondary | ICD-10-CM | POA: Diagnosis not present

## 2020-06-05 DIAGNOSIS — G319 Degenerative disease of nervous system, unspecified: Secondary | ICD-10-CM | POA: Diagnosis not present

## 2020-06-05 DIAGNOSIS — I672 Cerebral atherosclerosis: Secondary | ICD-10-CM | POA: Diagnosis not present

## 2020-06-05 DIAGNOSIS — R29818 Other symptoms and signs involving the nervous system: Secondary | ICD-10-CM | POA: Diagnosis not present

## 2020-06-05 NOTE — ED Notes (Signed)
Patient verbalized understanding of discharge instructions. Opportunity for questions and answers.  

## 2020-06-05 NOTE — ED Notes (Signed)
Patient transported to CT 

## 2020-06-07 DIAGNOSIS — N312 Flaccid neuropathic bladder, not elsewhere classified: Secondary | ICD-10-CM | POA: Diagnosis not present

## 2020-06-14 ENCOUNTER — Other Ambulatory Visit: Payer: Self-pay

## 2020-06-14 ENCOUNTER — Encounter: Payer: Self-pay | Admitting: Vascular Surgery

## 2020-06-14 ENCOUNTER — Ambulatory Visit: Payer: Medicare HMO | Admitting: Vascular Surgery

## 2020-06-14 ENCOUNTER — Encounter (HOSPITAL_BASED_OUTPATIENT_CLINIC_OR_DEPARTMENT_OTHER): Payer: Medicare HMO | Admitting: Internal Medicine

## 2020-06-14 VITALS — BP 103/66 | HR 78 | Temp 98.5°F | Resp 20 | Ht 72.0 in | Wt 180.0 lb

## 2020-06-14 DIAGNOSIS — L97518 Non-pressure chronic ulcer of other part of right foot with other specified severity: Secondary | ICD-10-CM | POA: Diagnosis not present

## 2020-06-14 DIAGNOSIS — L97812 Non-pressure chronic ulcer of other part of right lower leg with fat layer exposed: Secondary | ICD-10-CM | POA: Diagnosis not present

## 2020-06-14 DIAGNOSIS — I252 Old myocardial infarction: Secondary | ICD-10-CM | POA: Diagnosis not present

## 2020-06-14 DIAGNOSIS — L97221 Non-pressure chronic ulcer of left calf limited to breakdown of skin: Secondary | ICD-10-CM | POA: Diagnosis not present

## 2020-06-14 DIAGNOSIS — L97522 Non-pressure chronic ulcer of other part of left foot with fat layer exposed: Secondary | ICD-10-CM | POA: Diagnosis not present

## 2020-06-14 DIAGNOSIS — I7025 Atherosclerosis of native arteries of other extremities with ulceration: Secondary | ICD-10-CM

## 2020-06-14 DIAGNOSIS — L97521 Non-pressure chronic ulcer of other part of left foot limited to breakdown of skin: Secondary | ICD-10-CM | POA: Diagnosis not present

## 2020-06-14 DIAGNOSIS — E11621 Type 2 diabetes mellitus with foot ulcer: Secondary | ICD-10-CM | POA: Diagnosis not present

## 2020-06-14 DIAGNOSIS — L89613 Pressure ulcer of right heel, stage 3: Secondary | ICD-10-CM | POA: Diagnosis not present

## 2020-06-14 DIAGNOSIS — I130 Hypertensive heart and chronic kidney disease with heart failure and stage 1 through stage 4 chronic kidney disease, or unspecified chronic kidney disease: Secondary | ICD-10-CM | POA: Diagnosis not present

## 2020-06-14 DIAGNOSIS — L97822 Non-pressure chronic ulcer of other part of left lower leg with fat layer exposed: Secondary | ICD-10-CM | POA: Diagnosis not present

## 2020-06-14 DIAGNOSIS — L97321 Non-pressure chronic ulcer of left ankle limited to breakdown of skin: Secondary | ICD-10-CM | POA: Diagnosis not present

## 2020-06-14 DIAGNOSIS — L97211 Non-pressure chronic ulcer of right calf limited to breakdown of skin: Secondary | ICD-10-CM | POA: Diagnosis not present

## 2020-06-14 NOTE — Progress Notes (Signed)
[name], SALAMI ([id number]) Visit Report for 06/14/2020 Arrival Information Details Patient Name: Date of Service: [name] [name] [facility] F. 06/14/2020 8:00 A M Medical Record Number: [id number] Patient Account Number: [id number] Date of Birth/Sex: Treating RN: 06/10/1945 (75 y.o. [name] [name], [name] Primary Care Provider: [name], [name] Other Clinician: Referring Provider: Treating Provider/Extender: [name], [name] [name], [name] Weeks in Treatment: 217 Visit Information History Since Last Visit Added or deleted any medications: No Patient Arrived: Wheel Chair Any new allergies or adverse reactions: No Arrival Time: 08:07 Had a fall or experienced change in No Accompanied By: daughter activities of daily living that may affect Transfer Assistance: Manual risk of falls: Patient Identification Verified: Yes Signs or symptoms of abuse/neglect since last visito No Secondary Verification Process Completed: Yes Hospitalized since last visit: No Patient Requires Transmission-Based Precautions: No Implantable device outside of the clinic excluding No Patient Has Alerts: Yes cellular tissue based products placed in the center Patient Alerts: Patient on Blood Thinner since last visit: left ABI 1.0; rt ABI 1.09 Has Dressing in Place as Prescribed: Yes Pain Present Now: No Electronic Signature(s) Signed: 06/14/2020 9:47:25 AM By: Sandre Kitty Entered By: Sandre Kitty on 06/14/2020 08:08:37 -------------------------------------------------------------------------------- Clinic Level of Care Assessment Details Patient Name: Date of Service: [name] [name] [facility] F. 06/14/2020 8:00 A M Medical Record Number: [id number] Patient Account Number: [id number] Date of Birth/Sex: Treating RN: Feb 12, 1946 (75 y.o. [name] [name], [place] Primary Care Provider: [name], [name] Other Clinician: Referring Provider: Treating Provider/Extender: [name], [name] [name], [name] Weeks in Treatment: 217 Clinic  Level of Care Assessment Items TOOL 4 Quantity Score X- 1 0 Use when only an EandM is performed on FOLLOW-UP visit ASSESSMENTS - Nursing Assessment / Reassessment X- 1 10 Reassessment of Co-morbidities (includes updates in patient status) X- 1 5 Reassessment of Adherence to Treatment Plan ASSESSMENTS - Wound and Skin A ssessment / Reassessment '[]'$  - 0 Simple Wound Assessment / Reassessment - one wound X- 10 5 Complex Wound Assessment / Reassessment - multiple wounds X- 1 10 Dermatologic / Skin Assessment (not related to wound area) ASSESSMENTS - Focused Assessment X- 1 5 Circumferential Edema Measurements - multi extremities X- 1 10 Nutritional Assessment / Counseling / Intervention X- 1 5 Lower Extremity Assessment (monofilament, tuning fork, pulses) '[]'$  - 0 Peripheral Arterial Disease Assessment (using hand held doppler) ASSESSMENTS - Ostomy and/or Continence Assessment and Care '[]'$  - 0 Incontinence Assessment and Management '[]'$  - 0 Ostomy Care Assessment and Management (repouching, etc.) PROCESS - Coordination of Care '[]'$  - 0 Simple Patient / Family Education for ongoing care X- 1 20 Complex (extensive) Patient / Family Education for ongoing care X- 1 10 Staff obtains Programmer, systems, Records, T Results / Process Orders est '[]'$  - 0 Staff telephones HHA, Nursing Homes / Clarify orders / etc '[]'$  - 0 Routine Transfer to another Facility (non-emergent condition) '[]'$  - 0 Routine Hospital Admission (non-emergent condition) '[]'$  - 0 New Admissions / Biomedical engineer / Ordering NPWT Apligraf, etc. , '[]'$  - 0 Emergency Hospital Admission (emergent condition) '[]'$  - 0 Simple Discharge Coordination X- 1 15 Complex (extensive) Discharge Coordination PROCESS - Special Needs '[]'$  - 0 Pediatric / Minor Patient Management '[]'$  - 0 Isolation Patient Management '[]'$  - 0 Hearing / Language / Visual special needs '[]'$  - 0 Assessment of Community assistance (transportation, D/C planning,  etc.) '[]'$  - 0 Additional assistance / Altered mentation '[]'$  - 0 Support Surface(s) Assessment (bed, cushion, seat, etc.) INTERVENTIONS - Wound Cleansing / Measurement '[]'$  - 0 Simple Wound Cleansing -  one wound X- 10 5 Complex Wound Cleansing - multiple wounds X- 1 5 Wound Imaging (photographs - any number of wounds) '[]'$  - 0 Wound Tracing (instead of photographs) '[]'$  - 0 Simple Wound Measurement - one wound X- 10 5 Complex Wound Measurement - multiple wounds INTERVENTIONS - Wound Dressings '[]'$  - 0 Small Wound Dressing one or multiple wounds '[]'$  - 0 Medium Wound Dressing one or multiple wounds X- 10 20 Large Wound Dressing one or multiple wounds X- 1 5 Application of Medications - topical '[]'$  - 0 Application of Medications - injection INTERVENTIONS - Miscellaneous '[]'$  - 0 External ear exam '[]'$  - 0 Specimen Collection (cultures, biopsies, blood, body fluids, etc.) '[]'$  - 0 Specimen(s) / Culture(s) sent or taken to Lab for analysis '[]'$  - 0 Patient Transfer (multiple staff / Civil Service fast streamer / Similar devices) '[]'$  - 0 Simple Staple / Suture removal (25 or less) '[]'$  - 0 Complex Staple / Suture removal (26 or more) '[]'$  - 0 Hypo / Hyperglycemic Management (close monitor of Blood Glucose) '[]'$  - 0 Ankle / Brachial Index (ABI) - do not check if billed separately X- 1 5 Vital Signs Has the patient been seen at the hospital within the last three years: Yes Total Score: 455 Level Of Care: New/Established - Level 5 Electronic Signature(s) Signed: 06/14/2020 5:43:17 PM By: [name] Entered By: [name] on 06/14/2020 09:05:37 -------------------------------------------------------------------------------- Encounter Discharge Information Details Patient Name: Date of Service: [name] [name] F. 06/14/2020 8:00 A M Medical Record Number: [id number] Patient Account Number: [id number] Date of Birth/Sex: Treating RN: Feb 01, 1946 (75 y.o. [name] [name] Primary Care Provider:  [name], [name] Other Clinician: Referring Provider: Treating Provider/Extender: [name], [name] [name], [name] Weeks in Treatment: 217 Encounter Discharge Information Items Discharge Condition: Stable Ambulatory Status: Wheelchair Discharge Destination: Home Transportation: Private Auto Accompanied By: daughter Schedule Follow-up Appointment: Yes Clinical Summary of Care: Patient Declined Electronic Signature(s) Signed: 06/14/2020 5:33:10 PM By: Levan Hurst RN, BSN Entered By: Levan Hurst on 06/14/2020 10:38:45 -------------------------------------------------------------------------------- Lower Extremity Assessment Details Patient Name: Date of Service: [name] [name] F. 06/14/2020 8:00 A M Medical Record Number: [id number] Patient Account Number: [id number] Date of Birth/Sex: Treating RN: [birth year] (75 y.o. [name] Primary Care Provider: [name], [name] Other Clinician: Referring Provider: Treating Provider/Extender: [name], [name] [name], [name] Weeks in Treatment: 217 Edema Assessment Assessed: Shirlyn Goltz: No] [Right: No] Edema: [Left: No] [Right: No] Calf Left: Right: Point of Measurement: 36 cm From Medial Instep 24.5 cm 23.5 cm Ankle Left: Right: Point of Measurement: 11 cm From Medial Instep 20.5 cm 18 cm Vascular Assessment Pulses: Dorsalis Pedis Palpable: [Left:Yes] [Right:No] Electronic Signature(s) Signed: 06/14/2020 5:38:13 PM By: Baruch Gouty RN, BSN Entered By: Baruch Gouty on 06/14/2020 08:36:56 -------------------------------------------------------------------------------- Multi Wound Chart Details Patient Name: Date of Service: [name] [name] F. 06/14/2020 8:00 A M Medical Record Number: [id number] Patient Account Number: [id number] Date of Birth/Sex: Treating RN: [birth year] (75 y.o. [name] [name], [name] Primary Care Provider: [name], [name] Other Clinician: Referring Provider: Treating Provider/Extender: [name], [name] [name],  [name] Weeks in Treatment: 217 Vital Signs Height(in): 74 Capillary Blood Glucose(mg/dl): 109 Weight(lbs): 150 Pulse(bpm): 92 Body Mass Index(BMI): 19 Blood Pressure(mmHg): 119/70 Temperature(F): 97.7 Respiratory Rate(breaths/min): 18 Photos: [14:No Photos Left, Lateral Lower Leg] [25:No Photos Right, Lateral Lower Leg] [26:No Photos Left, Dorsal Foot] Wound Location: [14:Gradually Appeared] [25:Gradually Appeared] [26:Gradually Appeared] Wounding Event: [14:Diabetic Wound/Ulcer of the Lower] [25:Venous Leg Ulcer] [26:Diabetic Wound/Ulcer of the Lower] Primary Etiology: [14:Extremity Anemia, Sleep Apnea, Arrhythmia,] [25:Anemia,  Sleep Apnea, Arrhythmia,] [26:Extremity Anemia, Sleep Apnea, Arrhythmia,] Comorbid History: [14:Congestive Heart Failure, Coronary Artery Disease, Hypertension, Myocardial Infarction, Type II Diabetes, Gout, Neuropathy 08/15/2017] [25:Congestive Heart Failure, Coronary Congestive Heart Failure, Coronary Artery Disease,  Hypertension, Myocardial Infarction, Type II Diabetes, Gout, Neuropathy 07/06/2019] [26:Artery Disease, Hypertension, Myocardial Infarction, Type II Diabetes, Gout, Neuropathy 08/18/2019] Date Acquired: [14:147] [25:47] [26:43] Weeks of Treatment: [14:Open] [25:Open] [26:Open] Wound Status: [14:Yes] [25:No] [26:Yes] Clustered Wound: [14:3] [25:N/A] [26:5] Clustered Quantity: [14:9.5x1x0.3] [25:2x1x0.1] [26:3.4x1.6x0.3] Measurements L x W x D (cm) [14:7.461] [25:1.571] [id number] A (cm) : rea [14:2.238] [25:0.157] [26:1.282] Volume (cm) : [14:-692.00%] [25:42.50%] [26:-676.90%] % Reduction in Area: [14:-2280.90%] [25:42.50%] [26:-2230.90%] % Reduction in Volume: [14:No] [25:No] [26:No] Undermining: [14:Grade 2] [25:Full Thickness Without Exposed] [26:Grade 2] Classification: [14:Large] [25:Support Structures Medium] [26:Medium] Exudate A mount: [14:Purulent] [25:Purulent] [26:Purulent] Exudate Type: [14:yellow, brown, green] [25:yellow, brown,  green] [26:yellow, brown, green] Exudate Color: [14:Flat and Intact] [25:Flat and Intact] [26:Flat and Intact] Wound Margin: [14:Small (1-33%)] [25:Medium (34-66%)] [26:Small (1-33%)] Granulation Amount: [14:Red] [25:Red] [26:Red, Pink] Granulation Quality: [14:Large (67-100%)] [25:Medium (34-66%)] [26:Large (67-100%)] Necrotic Amount: [14:Adherent Slough] [25:Adherent Slough] [26:Eschar, Adherent Slough] Necrotic Tissue: [14:Fat Layer (Subcutaneous Tissue): Yes Fat Layer (Subcutaneous Tissue): Yes Fat Layer (Subcutaneous Tissue): Yes] Exposed Structures: [14:Fascia: No Tendon: No Muscle: No Joint: No Bone: No Small (1-33%)] [25:Fascia: No Tendon: No Muscle: No Joint: No Bone: No Medium (34-66%)] [26:Tendon: Yes Fascia: No Muscle: No Joint: No Bone: No None] Epithelialization: [14:excoriated periwound skin] [25:N/A] [26:N/A] Wound Number: '[id number] '$ Photos: No Photos No Photos No Photos Right, Dorsal Foot Right Calcaneus Right Knee Wound Location: Gradually Appeared Gradually Appeared Gradually Appeared Wounding Event: Diabetic Wound/Ulcer of the Lower Diabetic Wound/Ulcer of the Lower Abrasion Primary Etiology: Extremity Extremity Anemia, Sleep Apnea, Arrhythmia, Anemia, Sleep Apnea, Arrhythmia, Anemia, Sleep Apnea, Arrhythmia, Comorbid History: Congestive Heart Failure, Coronary Congestive Heart Failure, Coronary Congestive Heart Failure, Coronary Artery Disease, Hypertension, Artery Disease, Hypertension, Artery Disease, Hypertension, Myocardial Infarction, Type II Myocardial Infarction, Type II Myocardial Infarction, Type II Diabetes, Gout, Neuropathy Diabetes, Gout, Neuropathy Diabetes, Gout, Neuropathy 04/26/2020 04/26/2020 04/24/2020 Date Acquired: '7 7 7 '$ Weeks of Treatment: Open Open Open Wound Status: Yes No No Clustered Wound: 2 N/A N/A Clustered Quantity: 0.3x0.3x0.1 1x0.6x0.1 0.1x0.1x0.1 Measurements L x W x D (cm) 0.071 0.471 0.008 A (cm) : rea 0.007 0.047 0.001 Volume  (cm) : 93.20% 74.00% 99.40% % Reduction in Area: 93.30% 74.00% 99.30% % Reduction in Volume: No No No Undermining: Grade 1 Grade 1 Full Thickness Without Exposed Classification: Support Structures Small Small None Present Exudate A mount: Purulent Serosanguineous N/A Exudate Type: yellow, brown, green red, brown N/A Exudate Color: Flat and Intact Flat and Intact Flat and Intact Wound Margin: Small (1-33%) Large (67-100%) None Present (0%) Granulation Amount: Pink Pink N/A Granulation Quality: Large (67-100%) None Present (0%) None Present (0%) Necrotic Amount: Adherent Slough N/A N/A Necrotic Tissue: Fat Layer (Subcutaneous Tissue): Yes Fat Layer (Subcutaneous Tissue): Yes Fat Layer (Subcutaneous Tissue): Yes Exposed Structures: Fascia: No Fascia: No Fascia: No Tendon: No Tendon: No Tendon: No Muscle: No Muscle: No Muscle: No Joint: No Joint: No Joint: No Bone: No Bone: No Bone: No Small (1-33%) Medium (34-66%) Large (67-100%) Epithelialization: N/A N/A N/A Assessment Notes: Wound Number: [id number] Photos: No Photos No Photos No Photos Left, Distal, Dorsal Foot Right, Distal, Dorsal Foot Right, Medial, Dorsal Foot Wound Location: Gradually Appeared Gradually Appeared Gradually Appeared Wounding Event: Diabetic Wound/Ulcer of the Lower Diabetic Wound/Ulcer of the Lower Diabetic Wound/Ulcer of  the Lower Primary Etiology: Extremity Extremity Extremity Anemia, Sleep Apnea, Arrhythmia, Anemia, Sleep Apnea, Arrhythmia, Anemia, Sleep Apnea, Arrhythmia, Comorbid History: Congestive Heart Failure, Coronary Congestive Heart Failure, Coronary Congestive Heart Failure, Coronary Artery Disease, Hypertension, Artery Disease, Hypertension, Artery Disease, Hypertension, Myocardial Infarction, Type II Myocardial Infarction, Type II Myocardial Infarction, Type II Diabetes, Gout, Neuropathy Diabetes, Gout, Neuropathy Diabetes, Gout, Neuropathy 05/03/2020 05/17/2020  06/14/2020 Date Acquired: 6 4 0 Weeks of Treatment: Open Open Open Wound Status: No No No Clustered Wound: N/A N/A N/A Clustered Quantity: 1.4x1x0.3 0.4x0.4x0.1 0.7x0.5x0.1 Measurements L x W x D (cm) 1.1 0.126 0.275 A (cm) : rea 0.33 0.013 0.027 Volume (cm) : 36.60% 0.00% N/A % Reduction in A rea: -89.70% 0.00% N/A % Reduction in Volume: No No No Undermining: Grade 2 Grade 1 Grade 2 Classification: Medium Small Small Exudate A mount: Purulent Purulent Serosanguineous Exudate Type: yellow, brown, green yellow, brown, green red, brown Exudate Color: Flat and Intact Flat and Intact Flat and Intact Wound Margin: Small (1-33%) Small (1-33%) None Present (0%) Granulation A mount: Pink Pink N/A Granulation Quality: Large (67-100%) Large (67-100%) Large (67-100%) Necrotic A mount: Adherent Slough Eschar, Adherent Slough Eschar, Adherent Slough Necrotic Tissue: Fat Layer (Subcutaneous Tissue): Yes Fat Layer (Subcutaneous Tissue): Yes Fat Layer (Subcutaneous Tissue): Yes Exposed Structures: Fascia: No Fascia: No Fascia: No Tendon: No Tendon: No Tendon: No Muscle: No Muscle: No Muscle: No Joint: No Joint: No Joint: No Bone: No Bone: No Bone: No Small (1-33%) Small (1-33%) Small (1-33%) Epithelialization: N/A N/A N/A Assessment Notes: Wound Number: 35 N/A N/A Photos: No Photos N/A N/A Left, Lateral, Dorsal Foot N/A N/A Wound Location: Gradually Appeared N/A N/A Wounding Event: Diabetic Wound/Ulcer of the Lower N/A N/A Primary Etiology: Extremity Anemia, Sleep Apnea, Arrhythmia, N/A N/A Comorbid History: Congestive Heart Failure, Coronary Artery Disease, Hypertension, Myocardial Infarction, Type II Diabetes, Gout, Neuropathy 05/24/2020 N/A N/A Date Acquired: 0 N/A N/A Weeks of Treatment: Open N/A N/A Wound Status: No N/A N/A Clustered Wound: N/A N/A N/A Clustered Quantity: 0.5x1.2x0.5 N/A N/A Measurements L x W x D (cm) 0.471 N/A N/A A  (cm) : rea 0.236 N/A N/A Volume (cm) : N/A N/A N/A % Reduction in A rea: N/A N/A N/A % Reduction in Volume: 12 Starting Position 1 (o'clock): 12 Ending Position 1 (o'clock): 1.1 Maximum Distance 1 (cm): Yes N/A N/A Undermining: Grade 2 N/A N/A Classification: Medium N/A N/A Exudate A mount: Purulent N/A N/A Exudate Type: yellow, brown, green N/A N/A Exudate Color: Well defined, not attached N/A N/A Wound Margin: Medium (34-66%) N/A N/A Granulation A mount: Red, Hyper-granulation, Friable N/A N/A Granulation Quality: Medium (34-66%) N/A N/A Necrotic A mount: Adherent Slough N/A N/A Necrotic Tissue: Fat Layer (Subcutaneous Tissue): Yes N/A N/A Exposed Structures: Bone: Yes Fascia: No Tendon: No Muscle: No Joint: No Small (1-33%) N/A N/A Epithelialization: N/A N/A N/A Assessment Notes: Treatment Notes Electronic Signature(s) Signed: 06/14/2020 5:22:30 PM By: Linton Ham MD Signed: 06/14/2020 5:43:17 PM By: [name] Entered By: Linton Ham on 06/14/2020 09:21:51 -------------------------------------------------------------------------------- Multi-Disciplinary Care Plan Details Patient Name: Date of Service: [name] [name] F. 06/14/2020 8:00 A M Medical Record Number: [id number] Patient Account Number: [id number] Date of Birth/Sex: Treating RN: July 18, 1945 (75 y.o. [name] Primary Care Provider: [name], [name] Other Clinician: Referring Provider: Treating Provider/Extender: [name], [name] [name], [name] Weeks in Treatment: [place] reviewed with physician Active Inactive Wound/Skin Impairment Nursing Diagnoses: Impaired tissue integrity Goals: Patient/caregiver will verbalize understanding of skin care regimen Date  Initiated: 11/19/2017 Target Resolution Date: 06/24/2020 Goal Status: Active Ulcer/skin breakdown will have a volume reduction of 30% by week 4 Date Initiated: 11/19/2017 Date  Inactivated: 12/02/2017 Target Resolution Date: 12/22/2017 Goal Status: Unmet Unmet Reason: multipl comorbidities Interventions: Assess patient/caregiver ability to obtain necessary supplies Assess ulceration(s) every visit Provide education on ulcer and skin care Notes: Electronic Signature(s) Signed: 06/14/2020 5:43:17 PM By: [name] Entered By: [name] on 06/14/2020 08:08:59 -------------------------------------------------------------------------------- Pain Assessment Details Patient Name: Date of Service: [name] [name] F. 06/14/2020 8:00 A M Medical Record Number: [id number] Patient Account Number: [id number] Date of Birth/Sex: Treating RN: 09/04/1945 (75 y.o. [name] [name], [name] Primary Care Provider: [name], [name] Other Clinician: Referring Provider: Treating Provider/Extender: [name], [name] [name], [name] Weeks in Treatment: 217 Active Problems Location of Pain Severity and Description of Pain Patient Has Paino No Site Locations Pain Management and Medication Current Pain Management: Electronic Signature(s) Signed: 06/14/2020 9:47:25 AM By: Sandre Kitty Signed: 06/14/2020 5:43:17 PM By: [name] Entered By: Sandre Kitty on 06/14/2020 08:09:10 -------------------------------------------------------------------------------- Patient/Caregiver Education Details Patient Name: Date of Service: [name] 2/22/2022andnbsp8:00 A M Medical Record Number: [id number] Patient Account Number: [id number] Date of Birth/Gender: Treating RN: [birth year] (75 y.o. [name] Primary Care Physician: [name], [name] Other Clinician: Referring Physician: Treating Physician/Extender: [name], [name] [name], [name] Weeks in Treatment: 217 Education Assessment Education Provided To: Patient Education Topics Provided Basic Hygiene: Responses: Reinforcements needed Wound/Skin Impairment: Methods: Explain/Verbal Responses:  State content correctly Electronic Signature(s) Signed: 06/14/2020 5:43:17 PM By: [name] Entered By: [name] on 06/14/2020 08:09:43 -------------------------------------------------------------------------------- Wound Assessment Details Patient Name: Date of Service: [name] [name] F. 06/14/2020 8:00 A M Medical Record Number: [id number] Patient Account Number: [id number] Date of Birth/Sex: Treating RN: [birth year] (75 y.o. [name] Primary Care Provider: [name], [name] Other Clinician: Referring Provider: Treating Provider/Extender: [name], [name] [name], [name] Weeks in Treatment: 217 Wound Status Wound Number: 14 Primary Diabetic Wound/Ulcer of the Lower Extremity Etiology: Wound Location: Left, Lateral Lower Leg Wound Open Wounding Event: Gradually Appeared Status: Date Acquired: 08/15/2017 Comorbid Anemia, Sleep Apnea, Arrhythmia, Congestive Heart Failure, Weeks Of Treatment: 147 History: Coronary Artery Disease, Hypertension, Myocardial Infarction, Clustered Wound: Yes Type II Diabetes, Gout, Neuropathy Wound Measurements Length: (cm) 9.5 Width: (cm) 1 Depth: (cm) 0.3 Clustered Quantity: 3 Area: (cm) 7.461 Volume: (cm) 2.238 % Reduction in Area: -692% % Reduction in Volume: -2280.9% Epithelialization: Small (1-33%) Tunneling: No Undermining: No Wound Description Classification: Grade 2 Wound Margin: Flat and Intact Exudate Amount: Large Exudate Type: Purulent Exudate Color: yellow, brown, green Foul Odor After Cleansing: No Slough/Fibrino Yes Wound Bed Granulation Amount: Small (1-33%) Exposed Structure Granulation Quality: Red Fascia Exposed: No Necrotic Amount: Large (67-100%) Fat Layer (Subcutaneous Tissue) Exposed: Yes Necrotic Quality: Adherent Slough Tendon Exposed: No Muscle Exposed: No Joint Exposed: No Bone Exposed: No Assessment Notes excoriated periwound skin Treatment Notes Wound #14 (Lower Leg) Wound  Laterality: Left, Lateral Cleanser Soap and Water Discharge Instruction: May shower and wash wound with dial antibacterial soap and water prior to dressing change. Peri-Wound Care Sween Lotion (Moisturizing lotion) Discharge Instruction: Apply moisturizing lotion as directed Topical Primary Dressing KerraCel Ag Gelling Fiber Dressing, 4x5 in (silver alginate) Discharge Instruction: Apply silver alginate to wound bed as instructed Secondary Dressing Woven Gauze Sponge, Non-Sterile 4x4 in Discharge Instruction: Apply over primary dressing as directed. ABD Pad, 5x9 Discharge Instruction: Apply over primary dressing as directed. Secured With Compression Wrap Kerlix Roll 4.5x3.1 (in/yd) Discharge Instruction:  Apply Kerlix and Coban compression as directed. Compression Stockings Add-Ons Electronic Signature(s) Signed: 06/14/2020 5:38:13 PM By: Baruch Gouty RN, BSN Entered By: Baruch Gouty on 06/14/2020 [id number] -------------------------------------------------------------------------------- Wound Assessment Details Patient Name: Date of Service: [name] [name] F. 06/14/2020 8:00 A M Medical Record Number: [id number] Patient Account Number: [id number] Date of Birth/Sex: Treating RN: 1946-01-22 (75 y.o. [name] Primary Care Provider: [name], [name] Other Clinician: Referring Provider: Treating Provider/Extender: [name], [name] [name], [name] Weeks in Treatment: 217 Wound Status Wound Number: 25 Primary Venous Leg Ulcer Etiology: Wound Location: Right, Lateral Lower Leg Wound Open Wounding Event: Gradually Appeared Status: Date Acquired: 07/06/2019 Comorbid Anemia, Sleep Apnea, Arrhythmia, Congestive Heart Failure, Weeks Of Treatment: 47 History: Coronary Artery Disease, Hypertension, Myocardial Infarction, Clustered Wound: No Type II Diabetes, Gout, Neuropathy Wound Measurements Length: (cm) 2 Width: (cm) 1 Depth: (cm) 0.1 Area: (cm) 1.571 Volume: (cm)  0.157 % Reduction in Area: 42.5% % Reduction in Volume: 42.5% Epithelialization: Medium (34-66%) Tunneling: No Undermining: No Wound Description Classification: Full Thickness Without Exposed Support Structures Wound Margin: Flat and Intact Exudate Amount: Medium Exudate Type: Purulent Exudate Color: yellow, brown, green Foul Odor After Cleansing: No Slough/Fibrino No Wound Bed Granulation Amount: Medium (34-66%) Exposed Structure Granulation Quality: Red Fascia Exposed: No Necrotic Amount: Medium (34-66%) Fat Layer (Subcutaneous Tissue) Exposed: Yes Necrotic Quality: Adherent Slough Tendon Exposed: No Muscle Exposed: No Joint Exposed: No Bone Exposed: No Treatment Notes Wound #25 (Lower Leg) Wound Laterality: Right, Lateral Cleanser Soap and Water Discharge Instruction: May shower and wash wound with dial antibacterial soap and water prior to dressing change. Peri-Wound Care Sween Lotion (Moisturizing lotion) Discharge Instruction: Apply moisturizing lotion as directed Topical Primary Dressing KerraCel Ag Gelling Fiber Dressing, 4x5 in (silver alginate) Discharge Instruction: Apply silver alginate to wound bed as instructed Secondary Dressing Woven Gauze Sponge, Non-Sterile 4x4 in Discharge Instruction: Apply over primary dressing as directed. ABD Pad, 5x9 Discharge Instruction: Apply over primary dressing as directed. Secured With Compression Wrap Kerlix Roll 4.5x3.1 (in/yd) Discharge Instruction: Apply Kerlix and Coban compression as directed. Compression Stockings Add-Ons Electronic Signature(s) Signed: 06/14/2020 5:38:13 PM By: Baruch Gouty RN, BSN Entered By: Baruch Gouty on 06/14/2020 08:29:05 -------------------------------------------------------------------------------- Wound Assessment Details Patient Name: Date of Service: [name] [name] F. 06/14/2020 8:00 A M Medical Record Number: [id number] Patient Account Number: [id number] Date of  Birth/Sex: Treating RN: 07/18/1945 (75 y.o. [name] Primary Care Provider: [name], [name] Other Clinician: Referring Provider: Treating Provider/Extender: [name], [name] [name], [name] Weeks in Treatment: 217 Wound Status Wound Number: 26 Primary Diabetic Wound/Ulcer of the Lower Extremity Etiology: Wound Location: Left, Dorsal Foot Wound Open Wounding Event: Gradually Appeared Status: Date Acquired: 08/18/2019 Comorbid Anemia, Sleep Apnea, Arrhythmia, Congestive Heart Failure, Weeks Of Treatment: 43 History: Coronary Artery Disease, Hypertension, Myocardial Infarction, Clustered Wound: Yes Type II Diabetes, Gout, Neuropathy Wound Measurements Length: (cm) 3.4 Width: (cm) 1.6 Depth: (cm) 0.3 Clustered Quantity: 5 Area: (cm) 4.273 Volume: (cm) 1.282 % Reduction in Area: -676.9% % Reduction in Volume: -2230.9% Epithelialization: None Tunneling: No Undermining: No Wound Description Classification: Grade 2 Wound Margin: Flat and Intact Exudate Amount: Medium Exudate Type: Purulent Exudate Color: yellow, brown, green Foul Odor After Cleansing: No Slough/Fibrino Yes Wound Bed Granulation Amount: Small (1-33%) Exposed Structure Granulation Quality: Red, Pink Fascia Exposed: No Necrotic Amount: Large (67-100%) Fat Layer (Subcutaneous Tissue) Exposed: Yes Necrotic Quality: Eschar, Adherent Slough Tendon Exposed: Yes Muscle Exposed: No Joint Exposed: No Bone Exposed: No Treatment Notes Wound #26 (Foot) Wound Laterality:  Dorsal, Left Cleanser Soap and Water Discharge Instruction: May shower and wash wound with dial antibacterial soap and water prior to dressing change. Peri-Wound Care Sween Lotion (Moisturizing lotion) Discharge Instruction: Apply moisturizing lotion as directed Topical Primary Dressing KerraCel Ag Gelling Fiber Dressing, 4x5 in (silver alginate) Discharge Instruction: Apply silver alginate to wound bed as instructed Secondary  Dressing Woven Gauze Sponge, Non-Sterile 4x4 in Discharge Instruction: Apply over primary dressing as directed. ABD Pad, 5x9 Discharge Instruction: Apply over primary dressing as directed. Secured With Compression Wrap Kerlix Roll 4.5x3.1 (in/yd) Discharge Instruction: Apply Kerlix and Coban compression as directed. Compression Stockings Add-Ons Electronic Signature(s) Signed: 06/14/2020 5:38:13 PM By: Baruch Gouty RN, BSN Entered By: Baruch Gouty on 06/14/2020 08:29:31 -------------------------------------------------------------------------------- Wound Assessment Details Patient Name: Date of Service: [name] [name] F. 06/14/2020 8:00 A M Medical Record Number: [id number] Patient Account Number: [id number] Date of Birth/Sex: Treating RN: 1945/11/12 (75 y.o. [name] Primary Care Provider: Other Clinician: [name], [name] Referring Provider: Treating Provider/Extender: [name], [name] [name], [name] Weeks in Treatment: 217 Wound Status Wound Number: 28 Primary Diabetic Wound/Ulcer of the Lower Extremity Etiology: Wound Location: Right, Dorsal Foot Wound Open Wounding Event: Gradually Appeared Status: Date Acquired: 04/26/2020 Comorbid Anemia, Sleep Apnea, Arrhythmia, Congestive Heart Failure, Weeks Of Treatment: 7 History: Coronary Artery Disease, Hypertension, Myocardial Infarction, Clustered Wound: Yes Type II Diabetes, Gout, Neuropathy Wound Measurements Length: (cm) 0.3 Width: (cm) 0.3 Depth: (cm) 0.1 Clustered Quantity: 2 Area: (cm) 0.071 Volume: (cm) 0.007 % Reduction in Area: 93.2% % Reduction in Volume: 93.3% Epithelialization: Small (1-33%) Tunneling: No Undermining: No Wound Description Classification: Grade 1 Wound Margin: Flat and Intact Exudate Amount: Small Exudate Type: Purulent Exudate Color: yellow, brown, green Foul Odor After Cleansing: No Slough/Fibrino Yes Wound Bed Granulation Amount: Small (1-33%) Exposed  Structure Granulation Quality: Pink Fascia Exposed: No Necrotic Amount: Large (67-100%) Fat Layer (Subcutaneous Tissue) Exposed: Yes Necrotic Quality: Adherent Slough Tendon Exposed: No Muscle Exposed: No Joint Exposed: No Bone Exposed: No Treatment Notes Wound #28 (Foot) Wound Laterality: Dorsal, Right Cleanser Soap and Water Discharge Instruction: May shower and wash wound with dial antibacterial soap and water prior to dressing change. Peri-Wound Care Sween Lotion (Moisturizing lotion) Discharge Instruction: Apply moisturizing lotion as directed Topical Primary Dressing KerraCel Ag Gelling Fiber Dressing, 4x5 in (silver alginate) Discharge Instruction: Apply silver alginate to wound bed as instructed Secondary Dressing Woven Gauze Sponge, Non-Sterile 4x4 in Discharge Instruction: Apply over primary dressing as directed. ABD Pad, 5x9 Discharge Instruction: Apply over primary dressing as directed. Secured With Compression Wrap Kerlix Roll 4.5x3.1 (in/yd) Discharge Instruction: Apply Kerlix and Coban compression as directed. Compression Stockings Add-Ons Electronic Signature(s) Signed: 06/14/2020 5:38:13 PM By: Baruch Gouty RN, BSN Entered By: Baruch Gouty on 06/14/2020 08:29:58 -------------------------------------------------------------------------------- Wound Assessment Details Patient Name: Date of Service: [name] [name] F. 06/14/2020 8:00 A M Medical Record Number: [id number] Patient Account Number: [id number] Date of Birth/Sex: Treating RN: 09/18/1945 (75 y.o. [name] Primary Care Provider: [name], [name] Other Clinician: Referring Provider: Treating Provider/Extender: [name], [name] [name], [name] Weeks in Treatment: 217 Wound Status Wound Number: 29 Primary Diabetic Wound/Ulcer of the Lower Extremity Etiology: Wound Location: Right Calcaneus Wound Open Wounding Event: Gradually Appeared Status: Date Acquired: 04/26/2020 Comorbid  Anemia, Sleep Apnea, Arrhythmia, Congestive Heart Failure, Weeks Of Treatment: 7 History: Coronary Artery Disease, Hypertension, Myocardial Infarction, Clustered Wound: No Type II Diabetes, Gout, Neuropathy Wound Measurements Length: (cm) 1 Width: (cm) 0.6 Depth: (cm) 0.1 Area: (cm) 0.471 Volume: (cm) 0.047 %  Reduction in Area: 74% % Reduction in Volume: 74% Epithelialization: Medium (34-66%) Tunneling: No Undermining: No Wound Description Classification: Grade 1 Wound Margin: Flat and Intact Exudate Amount: Small Exudate Type: Serosanguineous Exudate Color: red, brown Foul Odor After Cleansing: No Slough/Fibrino No Wound Bed Granulation Amount: Large (67-100%) Exposed Structure Granulation Quality: Pink Fascia Exposed: No Necrotic Amount: None Present (0%) Fat Layer (Subcutaneous Tissue) Exposed: Yes Tendon Exposed: No Muscle Exposed: No Joint Exposed: No Bone Exposed: No Treatment Notes Wound #29 (Calcaneus) Wound Laterality: Right Cleanser Soap and Water Discharge Instruction: May shower and wash wound with dial antibacterial soap and water prior to dressing change. Peri-Wound Care Sween Lotion (Moisturizing lotion) Discharge Instruction: Apply moisturizing lotion as directed Topical Primary Dressing KerraCel Ag Gelling Fiber Dressing, 4x5 in (silver alginate) Discharge Instruction: Apply silver alginate to wound bed as instructed Secondary Dressing Woven Gauze Sponge, Non-Sterile 4x4 in Discharge Instruction: Apply over primary dressing as directed. ABD Pad, 5x9 Discharge Instruction: Apply over primary dressing as directed. Secured With Compression Wrap Kerlix Roll 4.5x3.1 (in/yd) Discharge Instruction: Apply Kerlix and Coban compression as directed. Compression Stockings Add-Ons Electronic Signature(s) Signed: 06/14/2020 5:38:13 PM By: Baruch Gouty RN, BSN Entered By: Baruch Gouty on 06/14/2020  08:30:21 -------------------------------------------------------------------------------- Wound Assessment Details Patient Name: Date of Service: [name] [name] F. 06/14/2020 8:00 A M Medical Record Number: [id number] Patient Account Number: [id number] Date of Birth/Sex: Treating RN: 1945/06/23 (75 y.o. [name] Primary Care Provider: [name], [name] Other Clinician: Referring Provider: Treating Provider/Extender: [name], [name] [name], [name] Weeks in Treatment: 217 Wound Status Wound Number: 31 Primary Abrasion Etiology: Wound Location: Right Knee Wound Open Wounding Event: Gradually Appeared Status: Date Acquired: 04/24/2020 Comorbid Anemia, Sleep Apnea, Arrhythmia, Congestive Heart Failure, Weeks Of Treatment: 7 History: Coronary Artery Disease, Hypertension, Myocardial Infarction, Clustered Wound: No Type II Diabetes, Gout, Neuropathy Wound Measurements Length: (cm) 0.1 Width: (cm) 0.1 Depth: (cm) 0.1 Area: (cm) 0.008 Volume: (cm) 0.001 % Reduction in Area: 99.4% % Reduction in Volume: 99.3% Epithelialization: Large (67-100%) Tunneling: No Undermining: No Wound Description Classification: Full Thickness Without Exposed Support Structures Wound Margin: Flat and Intact Exudate Amount: None Present Foul Odor After Cleansing: No Slough/Fibrino No Wound Bed Granulation Amount: None Present (0%) Exposed Structure Necrotic Amount: None Present (0%) Fascia Exposed: No Fat Layer (Subcutaneous Tissue) Exposed: Yes Tendon Exposed: No Muscle Exposed: No Joint Exposed: No Bone Exposed: No Treatment Notes Wound #31 (Knee) Wound Laterality: Right Cleanser Soap and Water Discharge Instruction: May shower and wash wound with dial antibacterial soap and water prior to dressing change. Peri-Wound Care Sween Lotion (Moisturizing lotion) Discharge Instruction: Apply moisturizing lotion as directed Topical Primary Dressing KerraCel Ag Gelling Fiber Dressing, 4x5  in (silver alginate) Discharge Instruction: Apply silver alginate to wound bed as instructed Secondary Dressing Woven Gauze Sponge, Non-Sterile 4x4 in Discharge Instruction: Apply over primary dressing as directed. ABD Pad, 5x9 Discharge Instruction: Apply over primary dressing as directed. Secured With Compression Wrap Kerlix Roll 4.5x3.1 (in/yd) Discharge Instruction: Apply Kerlix and Coban compression as directed. Compression Stockings Add-Ons Electronic Signature(s) Signed: 06/14/2020 5:38:13 PM By: Baruch Gouty RN, BSN Entered By: Baruch Gouty on 06/14/2020 08:30:41 -------------------------------------------------------------------------------- Wound Assessment Details Patient Name: Date of Service: [name] [name] F. 06/14/2020 8:00 A M Medical Record Number: [id number] Patient Account Number: [id number] Date of Birth/Sex: Treating RN: 1945-09-24 (75 y.o. [name] Primary Care Provider: [name], [name] Other Clinician: Referring Provider: Treating Provider/Extender: [name], [name] [name], [name] Weeks in Treatment: 217 Wound Status Wound Number: 46  Primary Diabetic Wound/Ulcer of the Lower Extremity Etiology: Wound Location: Left, Distal, Dorsal Foot Wound Open Wounding Event: Gradually Appeared Status: Date Acquired: 05/03/2020 Comorbid Anemia, Sleep Apnea, Arrhythmia, Congestive Heart Failure, Weeks Of Treatment: 6 History: Coronary Artery Disease, Hypertension, Myocardial Infarction, Clustered Wound: No Type II Diabetes, Gout, Neuropathy Wound Measurements Length: (cm) 1.4 Width: (cm) 1 Depth: (cm) 0.3 Area: (cm) 1.1 Volume: (cm) 0.33 % Reduction in Area: 36.6% % Reduction in Volume: -89.7% Epithelialization: Small (1-33%) Tunneling: No Undermining: No Wound Description Classification: Grade 2 Wound Margin: Flat and Intact Exudate Amount: Medium Exudate Type: Purulent Exudate Color: yellow, brown, green Foul Odor After Cleansing:  No Slough/Fibrino Yes Wound Bed Granulation Amount: Small (1-33%) Exposed Structure Granulation Quality: Pink Fascia Exposed: No Necrotic Amount: Large (67-100%) Fat Layer (Subcutaneous Tissue) Exposed: Yes Necrotic Quality: Adherent Slough Tendon Exposed: No Muscle Exposed: No Joint Exposed: No Bone Exposed: No Treatment Notes Wound #32 (Foot) Wound Laterality: Dorsal, Left, Distal Cleanser Soap and Water Discharge Instruction: May shower and wash wound with dial antibacterial soap and water prior to dressing change. Peri-Wound Care Sween Lotion (Moisturizing lotion) Discharge Instruction: Apply moisturizing lotion as directed Topical Primary Dressing KerraCel Ag Gelling Fiber Dressing, 4x5 in (silver alginate) Discharge Instruction: Apply silver alginate to wound bed as instructed Secondary Dressing Woven Gauze Sponge, Non-Sterile 4x4 in Discharge Instruction: Apply over primary dressing as directed. ABD Pad, 5x9 Discharge Instruction: Apply over primary dressing as directed. Secured With Compression Wrap Kerlix Roll 4.5x3.1 (in/yd) Discharge Instruction: Apply Kerlix and Coban compression as directed. Compression Stockings Add-Ons Electronic Signature(s) Signed: 06/14/2020 5:38:13 PM By: Baruch Gouty RN, BSN Entered By: Baruch Gouty on 06/14/2020 08:31:45 -------------------------------------------------------------------------------- Wound Assessment Details Patient Name: Date of Service: [name] [name] F. 06/14/2020 8:00 A M Medical Record Number: [id number] Patient Account Number: [id number] Date of Birth/Sex: Treating RN: 1945-10-18 (75 y.o. [name] Primary Care Provider: [name], [name] Other Clinician: Referring Provider: Treating Provider/Extender: [name], [name] [name], [name] Weeks in Treatment: 217 Wound Status Wound Number: 33 Primary Diabetic Wound/Ulcer of the Lower Extremity Etiology: Wound Location: Right, Distal, Dorsal  Foot Wound Open Wounding Event: Gradually Appeared Status: Date Acquired: 05/17/2020 Comorbid Anemia, Sleep Apnea, Arrhythmia, Congestive Heart Failure, Weeks Of Treatment: 4 History: Coronary Artery Disease, Hypertension, Myocardial Infarction, Clustered Wound: No Type II Diabetes, Gout, Neuropathy Wound Measurements Length: (cm) 0.4 Width: (cm) 0.4 Depth: (cm) 0.1 Area: (cm) 0.126 Volume: (cm) 0.013 % Reduction in Area: 0% % Reduction in Volume: 0% Epithelialization: Small (1-33%) Tunneling: No Undermining: No Wound Description Classification: Grade 1 Wound Margin: Flat and Intact Exudate Amount: Small Exudate Type: Purulent Exudate Color: yellow, brown, green Wound Bed Granulation Amount: Small (1-33%) Granulation Quality: Pink Necrotic Amount: Large (67-100%) Necrotic Quality: Eschar, Adherent Slough Foul Odor After Cleansing: No Slough/Fibrino Yes Exposed Structure Fascia Exposed: No Fat Layer (Subcutaneous Tissue) Exposed: Yes Tendon Exposed: No Muscle Exposed: No Joint Exposed: No Bone Exposed: No Treatment Notes Wound #33 (Foot) Wound Laterality: Dorsal, Right, Distal Cleanser Soap and Water Discharge Instruction: May shower and wash wound with dial antibacterial soap and water prior to dressing change. Peri-Wound Care Sween Lotion (Moisturizing lotion) Discharge Instruction: Apply moisturizing lotion as directed Topical Primary Dressing KerraCel Ag Gelling Fiber Dressing, 4x5 in (silver alginate) Discharge Instruction: Apply silver alginate to wound bed as instructed Secondary Dressing Woven Gauze Sponge, Non-Sterile 4x4 in Discharge Instruction: Apply over primary dressing as directed. ABD Pad, 5x9 Discharge Instruction: Apply over primary dressing as directed. Secured With Compression Wrap Kerlix Roll  4.5x3.1 (in/yd) Discharge Instruction: Apply Kerlix and Coban compression as directed. Compression Stockings Add-Ons Electronic  Signature(s) Signed: 06/14/2020 5:38:13 PM By: Baruch Gouty RN, BSN Entered By: Baruch Gouty on 06/14/2020 08:32:16 -------------------------------------------------------------------------------- Wound Assessment Details Patient Name: Date of Service: [name] [name] F. 06/14/2020 8:00 A M Medical Record Number: [id number] Patient Account Number: [id number] Date of Birth/Sex: Treating RN: 1945/12/16 (75 y.o. [name] Primary Care Provider: [name], [name] Other Clinician: Referring Provider: Treating Provider/Extender: [name], [name] [name], [name] Weeks in Treatment: 217 Wound Status Wound Number: 34 Primary Diabetic Wound/Ulcer of the Lower Extremity Etiology: Wound Location: Right, Medial, Dorsal Foot Wound Open Wounding Event: Gradually Appeared Status: Date Acquired: 06/14/2020 Comorbid Anemia, Sleep Apnea, Arrhythmia, Congestive Heart Failure, Weeks Of Treatment: 0 History: Coronary Artery Disease, Hypertension, Myocardial Infarction, Clustered Wound: No Type II Diabetes, Gout, Neuropathy Wound Measurements Length: (cm) 0.7 Width: (cm) 0.5 Depth: (cm) 0.1 Area: (cm) 0.275 Volume: (cm) 0.027 % Reduction in Area: % Reduction in Volume: Epithelialization: Small (1-33%) Tunneling: No Undermining: No Wound Description Classification: Grade 2 Wound Margin: Flat and Intact Exudate Amount: Small Exudate Type: Serosanguineous Exudate Color: red, brown Foul Odor After Cleansing: No Slough/Fibrino Yes Wound Bed Granulation Amount: None Present (0%) Exposed Structure Necrotic Amount: Large (67-100%) Fascia Exposed: No Necrotic Quality: Eschar, Adherent Slough Fat Layer (Subcutaneous Tissue) Exposed: Yes Tendon Exposed: No Muscle Exposed: No Joint Exposed: No Bone Exposed: No Treatment Notes Wound #34 (Foot) Wound Laterality: Dorsal, Right, Medial Cleanser Soap and Water Discharge Instruction: May shower and wash wound with dial antibacterial soap  and water prior to dressing change. Peri-Wound Care Sween Lotion (Moisturizing lotion) Discharge Instruction: Apply moisturizing lotion as directed Topical Primary Dressing KerraCel Ag Gelling Fiber Dressing, 4x5 in (silver alginate) Discharge Instruction: Apply silver alginate to wound bed as instructed Secondary Dressing Woven Gauze Sponge, Non-Sterile 4x4 in Discharge Instruction: Apply over primary dressing as directed. ABD Pad, 5x9 Discharge Instruction: Apply over primary dressing as directed. Secured With Compression Wrap Kerlix Roll 4.5x3.1 (in/yd) Discharge Instruction: Apply Kerlix and Coban compression as directed. Compression Stockings Add-Ons Electronic Signature(s) Signed: 06/14/2020 5:38:13 PM By: Baruch Gouty RN, BSN Entered By: Baruch Gouty on 06/14/2020 08:23:55 -------------------------------------------------------------------------------- Wound Assessment Details Patient Name: Date of Service: [name] [name] F. 06/14/2020 8:00 A M Medical Record Number: [id number] Patient Account Number: [id number] Date of Birth/Sex: Treating RN: [birth year] (75 y.o. [name] Primary Care Provider: [name], [name] Other Clinician: Referring Provider: Treating Provider/Extender: [name], [name] [name], [name] Weeks in Treatment: 217 Wound Status Wound Number: 35 Primary Diabetic Wound/Ulcer of the Lower Extremity Etiology: Wound Location: Left, Lateral, Dorsal Foot Wound Open Wounding Event: Gradually Appeared Status: Date Acquired: 05/24/2020 Comorbid Anemia, Sleep Apnea, Arrhythmia, Congestive Heart Failure, Weeks Of Treatment: 0 History: Coronary Artery Disease, Hypertension, Myocardial Infarction, Clustered Wound: No Type II Diabetes, Gout, Neuropathy Wound Measurements Length: (cm) 0.5 Width: (cm) 1.2 Depth: (cm) 0.5 Area: (cm) 0.471 Volume: (cm) 0.236 % Reduction in Area: % Reduction in Volume: Epithelialization: Small (1-33%) Tunneling:  No Undermining: Yes Starting Position (o'clock): 12 Ending Position (o'clock): 12 Maximum Distance: (cm) 1.1 Wound Description Classification: Grade 2 Wound Margin: Well defined, not attached Exudate Amount: Medium Exudate Type: Purulent Exudate Color: yellow, brown, green Foul Odor After Cleansing: No Slough/Fibrino Yes Wound Bed Granulation Amount: Medium (34-66%) Exposed Structure Granulation Quality: Red, Hyper-granulation, Friable Fascia Exposed: No Necrotic Amount: Medium (34-66%) Fat Layer (Subcutaneous Tissue) Exposed: Yes Necrotic Quality: Adherent Slough Tendon Exposed: No Muscle Exposed: No Joint Exposed: No  Bone Exposed: Yes Treatment Notes Wound #35 (Foot) Wound Laterality: Dorsal, Left, Lateral Cleanser Soap and Water Discharge Instruction: May shower and wash wound with dial antibacterial soap and water prior to dressing change. Peri-Wound Care Sween Lotion (Moisturizing lotion) Discharge Instruction: Apply moisturizing lotion as directed Topical Primary Dressing KerraCel Ag Gelling Fiber Dressing, 4x5 in (silver alginate) Discharge Instruction: Apply silver alginate to wound bed as instructed Secondary Dressing Woven Gauze Sponge, Non-Sterile 4x4 in Discharge Instruction: Apply over primary dressing as directed. ABD Pad, 5x9 Discharge Instruction: Apply over primary dressing as directed. Secured With Compression Wrap Kerlix Roll 4.5x3.1 (in/yd) Discharge Instruction: Apply Kerlix and Coban compression as directed. Compression Stockings Add-Ons Electronic Signature(s) Signed: 06/14/2020 5:38:13 PM By: Baruch Gouty RN, BSN Entered By: Baruch Gouty on 06/14/2020 08:35:54 -------------------------------------------------------------------------------- [facility] Details Patient Name: Date of Service: [name] [name] F. 06/14/2020 8:00 A M Medical Record Number: [id number] Patient Account Number: [id number] Date of Birth/Sex: Treating  RN: 1945/05/31 (75 y.o. [name] [name], [name] Primary Care Provider: [name], [name] Other Clinician: Referring Provider: Treating Provider/Extender: [name], [name] [name], [name] Weeks in Treatment: 217 Vital Signs Time Taken: 08:08 Temperature (F): 97.7 Height (in): 74 Pulse (bpm): 92 Weight (lbs): 150 Respiratory Rate (breaths/min): 18 Body Mass Index (BMI): 19.3 Blood Pressure (mmHg): 119/70 Capillary Blood Glucose (mg/dl): 109 Reference Range: 80 - 120 mg / dl Electronic Signature(s) Signed: 06/14/2020 9:47:25 AM By: Sandre Kitty Entered By: Sandre Kitty on 06/14/2020 08:09:02
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
## 2020-06-15 ENCOUNTER — Other Ambulatory Visit (HOSPITAL_COMMUNITY)
Admit: 2020-06-15 | Discharge: 2020-06-15 | Disposition: A | Discharge status: Home / Self Care | Payer: Medicare HMO | Source: Ambulatory Visit | Attending: Internal Medicine | Admitting: Internal Medicine

## 2020-06-15 DIAGNOSIS — L089 Local infection of the skin and subcutaneous tissue, unspecified: Secondary | ICD-10-CM | POA: Diagnosis not present

## 2020-06-17 DIAGNOSIS — L89522 Pressure ulcer of left ankle, stage 2: Secondary | ICD-10-CM | POA: Diagnosis not present

## 2020-06-17 DIAGNOSIS — L89623 Pressure ulcer of left heel, stage 3: Secondary | ICD-10-CM | POA: Diagnosis not present

## 2020-06-17 DIAGNOSIS — L97521 Non-pressure chronic ulcer of other part of left foot limited to breakdown of skin: Secondary | ICD-10-CM | POA: Diagnosis not present

## 2020-06-17 DIAGNOSIS — L89613 Pressure ulcer of right heel, stage 3: Secondary | ICD-10-CM | POA: Diagnosis not present

## 2020-06-17 DIAGNOSIS — L97211 Non-pressure chronic ulcer of right calf limited to breakdown of skin: Secondary | ICD-10-CM | POA: Diagnosis not present

## 2020-06-17 DIAGNOSIS — L89620 Pressure ulcer of left heel, unstageable: Secondary | ICD-10-CM | POA: Diagnosis not present

## 2020-06-17 DIAGNOSIS — L97221 Non-pressure chronic ulcer of left calf limited to breakdown of skin: Secondary | ICD-10-CM | POA: Diagnosis not present

## 2020-06-17 DIAGNOSIS — L97321 Non-pressure chronic ulcer of left ankle limited to breakdown of skin: Secondary | ICD-10-CM | POA: Diagnosis not present

## 2020-06-17 DIAGNOSIS — L89512 Pressure ulcer of right ankle, stage 2: Secondary | ICD-10-CM | POA: Diagnosis not present

## 2020-06-18 LAB — AEROBIC CULTURE W GRAM STAIN (SUPERFICIAL SPECIMEN)

## 2020-06-20 ENCOUNTER — Other Ambulatory Visit (HOSPITAL_COMMUNITY)
Admission: RE | Admit: 2020-06-20 | Discharge: 2020-06-20 | Disposition: A | Discharge status: Home / Self Care | Payer: Medicare HMO | Source: Ambulatory Visit | Attending: Vascular Surgery | Admitting: Vascular Surgery

## 2020-06-20 DIAGNOSIS — Z01812 Encounter for preprocedural laboratory examination: Secondary | ICD-10-CM | POA: Diagnosis not present

## 2020-06-20 DIAGNOSIS — Z20822 Contact with and (suspected) exposure to covid-19: Secondary | ICD-10-CM | POA: Diagnosis not present

## 2020-06-21 ENCOUNTER — Ambulatory Visit (HOSPITAL_COMMUNITY)
Admission: RE | Admit: 2020-06-21 | Discharge: 2020-06-21 | Disposition: A | Discharge status: Home / Self Care | Payer: Medicare HMO | Source: Ambulatory Visit | Attending: Internal Medicine | Admitting: Internal Medicine

## 2020-06-21 ENCOUNTER — Other Ambulatory Visit: Payer: Self-pay

## 2020-06-21 ENCOUNTER — Encounter (HOSPITAL_BASED_OUTPATIENT_CLINIC_OR_DEPARTMENT_OTHER): Payer: Medicare HMO | Admitting: Internal Medicine

## 2020-06-21 ENCOUNTER — Encounter (HOSPITAL_BASED_OUTPATIENT_CLINIC_OR_DEPARTMENT_OTHER): Payer: Medicare HMO | Attending: Internal Medicine | Admitting: Internal Medicine

## 2020-06-21 ENCOUNTER — Other Ambulatory Visit (HOSPITAL_COMMUNITY): Payer: Self-pay | Admitting: Internal Medicine

## 2020-06-21 DIAGNOSIS — L97221 Non-pressure chronic ulcer of left calf limited to breakdown of skin: Secondary | ICD-10-CM | POA: Insufficient documentation

## 2020-06-21 DIAGNOSIS — M810 Age-related osteoporosis without current pathological fracture: Secondary | ICD-10-CM | POA: Diagnosis not present

## 2020-06-21 DIAGNOSIS — E1169 Type 2 diabetes mellitus with other specified complication: Secondary | ICD-10-CM | POA: Insufficient documentation

## 2020-06-21 DIAGNOSIS — L97321 Non-pressure chronic ulcer of left ankle limited to breakdown of skin: Secondary | ICD-10-CM | POA: Insufficient documentation

## 2020-06-21 DIAGNOSIS — E11622 Type 2 diabetes mellitus with other skin ulcer: Secondary | ICD-10-CM | POA: Diagnosis not present

## 2020-06-21 DIAGNOSIS — L97812 Non-pressure chronic ulcer of other part of right lower leg with fat layer exposed: Secondary | ICD-10-CM | POA: Diagnosis not present

## 2020-06-21 DIAGNOSIS — E1142 Type 2 diabetes mellitus with diabetic polyneuropathy: Secondary | ICD-10-CM | POA: Diagnosis not present

## 2020-06-21 DIAGNOSIS — I130 Hypertensive heart and chronic kidney disease with heart failure and stage 1 through stage 4 chronic kidney disease, or unspecified chronic kidney disease: Secondary | ICD-10-CM | POA: Insufficient documentation

## 2020-06-21 DIAGNOSIS — L97822 Non-pressure chronic ulcer of other part of left lower leg with fat layer exposed: Secondary | ICD-10-CM | POA: Diagnosis not present

## 2020-06-21 DIAGNOSIS — S91302A Unspecified open wound, left foot, initial encounter: Secondary | ICD-10-CM | POA: Diagnosis not present

## 2020-06-21 DIAGNOSIS — I2510 Atherosclerotic heart disease of native coronary artery without angina pectoris: Secondary | ICD-10-CM | POA: Diagnosis not present

## 2020-06-21 DIAGNOSIS — Z950 Presence of cardiac pacemaker: Secondary | ICD-10-CM | POA: Insufficient documentation

## 2020-06-21 DIAGNOSIS — E11621 Type 2 diabetes mellitus with foot ulcer: Secondary | ICD-10-CM

## 2020-06-21 DIAGNOSIS — M869 Osteomyelitis, unspecified: Secondary | ICD-10-CM | POA: Diagnosis not present

## 2020-06-21 DIAGNOSIS — L97522 Non-pressure chronic ulcer of other part of left foot with fat layer exposed: Secondary | ICD-10-CM | POA: Diagnosis not present

## 2020-06-21 DIAGNOSIS — N183 Chronic kidney disease, stage 3 unspecified: Secondary | ICD-10-CM | POA: Diagnosis not present

## 2020-06-21 DIAGNOSIS — L97811 Non-pressure chronic ulcer of other part of right lower leg limited to breakdown of skin: Secondary | ICD-10-CM | POA: Insufficient documentation

## 2020-06-21 DIAGNOSIS — E1122 Type 2 diabetes mellitus with diabetic chronic kidney disease: Secondary | ICD-10-CM | POA: Diagnosis not present

## 2020-06-21 DIAGNOSIS — L97524 Non-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: Secondary | ICD-10-CM | POA: Insufficient documentation

## 2020-06-21 DIAGNOSIS — L97509 Non-pressure chronic ulcer of other part of unspecified foot with unspecified severity: Secondary | ICD-10-CM

## 2020-06-21 DIAGNOSIS — L97511 Non-pressure chronic ulcer of other part of right foot limited to breakdown of skin: Secondary | ICD-10-CM | POA: Diagnosis not present

## 2020-06-21 DIAGNOSIS — I252 Old myocardial infarction: Secondary | ICD-10-CM | POA: Insufficient documentation

## 2020-06-21 DIAGNOSIS — G4730 Sleep apnea, unspecified: Secondary | ICD-10-CM | POA: Insufficient documentation

## 2020-06-21 DIAGNOSIS — L89613 Pressure ulcer of right heel, stage 3: Secondary | ICD-10-CM | POA: Insufficient documentation

## 2020-06-21 DIAGNOSIS — I255 Ischemic cardiomyopathy: Secondary | ICD-10-CM | POA: Insufficient documentation

## 2020-06-21 DIAGNOSIS — L97512 Non-pressure chronic ulcer of other part of right foot with fat layer exposed: Secondary | ICD-10-CM | POA: Diagnosis not present

## 2020-06-21 DIAGNOSIS — M7989 Other specified soft tissue disorders: Secondary | ICD-10-CM | POA: Diagnosis not present

## 2020-06-21 DIAGNOSIS — M7732 Calcaneal spur, left foot: Secondary | ICD-10-CM | POA: Diagnosis not present

## 2020-06-21 DIAGNOSIS — I5042 Chronic combined systolic (congestive) and diastolic (congestive) heart failure: Secondary | ICD-10-CM | POA: Insufficient documentation

## 2020-06-21 LAB — SARS CORONAVIRUS 2 (TAT 6-24 HRS): SARS Coronavirus 2: NEGATIVE

## 2020-06-21 NOTE — Progress Notes (Signed)
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 he has a new area on the right lateral calf which was new today. 4/13; this is a patient with multiple medical problems. He has been in this clinic for a very prolonged period of time with different wounds in different areas in his lower extremities. Currently we have the left lateral tibia, left heel, left dorsal foot, right lateral heel and right lateral lower tibia. We have been using Santyl on the right and silver alginate on the left He has his daughter who changes his dressings. The  reason behind the nonhealing here and ulcer recurrence is really never been clear to me. The patient is debilitated. He uses bunny boots apparently at night to protect his heels and feet. His arterial studies have previously not suggested a primary arterial etiology 4/27 2-week follow-up. He has a new wound on the right anterior patella currently trauma against the wheelchair. Failing this had he has areas on the right lateral calf and right lateral calcaneus both of which we have been using Santyl. On the left side one of the original wounds on the left lateral tibia area, left calcaneus, punched out areas on the left dorsal foot. On the left we have been using  PolyMem Ag 5/11; 2-week follow-up. He does not have any new wounds this week and most of his wounds look better. Using Santyl on the right lateral heel and right lateral ankle and polymen Ag and everything else under compression. 09/15/19-Patient at 2-week follow-up has to the wounds about the same and the rest of the wounds smaller in dimensions, the left posterior wound is about the same, one of the wounds is healed on the right knee. 6/8; 2-week follow-up. His left lateral calf wound is now split into 2. He has an area on the left heel which is a pressure ulcer which I think is improving he also had the wrap injury areas on the crease of his left ankle/foot that are better. Still an area on the right heel that is also improved we have been using PolyMem Ag 6/22; 2-week follow-up. The left lateral calf wound measures about the same. Left heel is closed Right heel still open but looks better. Assorted wounds on the dorsal foot and ankle 1 of these requires debridement small wound with some depth. We have been using PolyMem Ag to all wound areas 7/13; Left heel is closed Right heel is open but looks smaller Small area on the right lateral calf looks smaller [place] of wounds on the dorsal left foot and ankle. All of these are superficial The substantial area on the left lateral mid tibia. Necrotic debris on the surface. I really do not completely understand this wound. Not making any progress. We have been using PolyMem Ag to all the wounds 8/10-Patient returns at 1 month, the left dorsal foot and ankle wounds are about the same, the left lateral wound on the leg definitely bigger, using PolyMem to all the wounds except the left lateral leg wound where Iodoflex is being tried. Overall these wounds seem to fluctuate in the progress with no clear resolution 8/31; I have not seen this man in over a month and a half. His area on the left leg is much more substantial necrotic surface much longer.  Our staff noted purulent drainage. He still has breakdown in the left foot and ankle I am not quite sure how to explain this. On the right side his right lateral heel is just about closed closed. Small area on the right lateral calf. His left heel is also closed 9/13; 2-week follow-up. Because of the increase in size and the condition of the wound on the left lateral leg I did a PCR culture on this/deep tissue culture last time this showed methicillin resistant staph aureus and group B strep. I use clindamycin, the patient is allergic to Septra and there was a tetracycline resistant gene detected as well 9/27; 2-week follow-up. Left lateral leg looks stable to improved slightly smaller. He has a scattering of areas on his left dorsal foot  but nothing on the left heel. On the right heel a small open area on the lateral aspect and also on the lateral part of his lower leg. We have been using silver alginate under compression As I have often done in this man I feel no reviewed his arterial studies which were last looked at in June 2020 at that point he had an ABI in the right of 0.93 monophasic waveforms however but the great toe pressure was 110 with a TBI of 0.89 on the left his ABI was 0.84 with a great toe pressure of 120 and a TBI of 0.97. This was indicative of mild arterial disease 10/26; the patient arrives with the wounds on his bilateral lower extremities looking as good as I have seen him in quite some time. First of all the lateral heels are both healed. There is callus here however no open wounds. The substantial area on his left upper lateral tibia which has been so refractory looks a lot better. He still has multiple small open areas on the dorsal left foot and ankle. One of these at the crease of the foot and ankle is actually quite deep and has debris in the center. On the right leg he only has one small open area on the right lateral leg this is just about closed We apparently had the  order of silver alginate they come in with polymen Ag. However given the fact that things look so much better I am going to continue with the latter 04/26/2020; patient returns to the clinic after about a 9 to 10-week hiatus. I am not exactly sure why. The patient says he had a cold and was sick. He was apparently tested for Covid but was negative. He literally dropped off our schedule. He arrives back in clinic with a multitude of wounds especially on the left dorsal foot and lower leg. There are also areas on the right dorsal foot and the right lower leg. Fortunately the pressure ulcers on the bilateral lateral heels of once again closed. Again the history here is vague. Apparently everything is deteriorated on the left greater than right over the last 2 to 3 weeks. His daughter wondered whether they were not wrapping this properly they have been applying topical antibiotic and a kerlix Coban wrap. He absolutely denies any pain 1/11; I put him under our kerlix, Coban wrap and the edema control is a lot better. They have not been changing this at home. There has been some improvement in his wounds except on the left dorsal foot and ankle. In fact he has a new wound on the left dorsal foot. The last time he was here I reviewed his past arterial studies and I am going to repeat them. Through this timeframe the wounds on his bilateral heels, pressure ulcers have actually managed to heal. 1/25; patient arrives with by my calculation new wounds on the right dorsal foot right heel. He has the area on his left ankle with exposed tendon that is larger he has the chronic area on the left lateral upper tibia which is no closer to healing. He has another area on the right lateral heel. We have been using silver alginate to everything except the tendon area on the left ankle He is going for his arterial studies this afternoon these are repeat from those that were done in June 2020. I am not sure I am convinced  these are venous wounds 2/8; the patient had his REPEAT ARTERIAL STUDIES not  much worse than previously but with the same pattern. On the right side his ABI was 0.89 with a TBI of 0.48 and monophasic waveforms on the left side ABI of 0.92 but with a TBI of 0.42 and monophasic biphasic waveforms the great toe pressure on the right was 57 on the left 50. I am wondering whether this represents falsely elevated ABIs from medial vessel calcification plus or minus microvascular disease. He has nonhealing wounds on the left dorsal foot with exposed tendon left dorsal foot distally in the left lateral calf. I have not been able to do anything with these wounds. We have been using silver collagen in the feet silver alginate in the legs He was in the ER last week and missed his appointment. He was felt to have influenza A I believe. I note from his lab work that his creatinine was 2.34 indicative of stage IV chronic renal failure he also had significant hypoalbuminemia. Certainly his creatinine is going to take make any thought about angiogram very risky here. 2/22; patient comes in today with 2 new wounds on the left dorsal foot both of which probes to bone. There is small area in the midfoot had some serosanguineous drainage which I cultured punched-out area on the right dorsal foot as well but these do not go to bone. He has a small pressure area on the right heel Goes to see vascular surgery today I am wondering if he needs an angiogram [question CO2 angiogram]. I am at a loss to really explain not only the nonhealing area on the left lateral calf but the constant breakdown in his dorsal feet. 2/1; patient saw Dr. Stanford Breed at [facility]. He feels his ABIs were falsely elevated because of his medial calcification. He is being planned for CO2 angiography. Culture I did last time showed methicillin resistant staph aureus from the left foot. I gave him on Augmentin this will obviously not work I am going  to put him on doxycycline. He apparently also had an x-ray I will need to research the result of this Electronic Signature(s) Signed: 06/21/2020 5:59:48 PM By: Linton Ham MD Entered By: Linton Ham on 06/21/2020 12:48:04 -------------------------------------------------------------------------------- Physical Exam Details Patient Name: Date of Service: [name] [name] F. 06/21/2020 11:00 A M Medical Record Number: [id number] Patient Account Number: [id number] Date of Birth/Sex: Treating RN: 1945-07-26 (75 y.o. [name] [name] Primary Care Provider: [name], [name] Other Clinician: Referring Provider: Treating Provider/Extender: [name] [name] [name] [name] Weeks in Treatment: 218 Constitutional Sitting or standing Blood Pressure is within target range for patient.. Pulse regular and within target range for patient.Marland Kitchen Respirations regular, non-labored and within target range.. Temperature is normal and within the target range for the patient.Marland Kitchen Appears in no distress. Notes Wound exam; his left dorsal foot is still angry with exposed tendon on the dorsal ankle wound to areas distally seem about the same. There is some erythema on the foot possibly related to infection. He has a punched-out area on the right lateral heel and the left lateral calf looks about the same. Electronic Signature(s) Signed: 06/21/2020 5:59:48 PM By: Linton Ham MD Entered By: Linton Ham on 06/21/2020 12:54:57 -------------------------------------------------------------------------------- Physician Orders Details Patient Name: Date of Service: [name] [name] F. 06/21/2020 11:00 A M Medical Record Number: [id number] Patient Account Number: [id number] Date of Birth/Sex: Treating RN: [birth year] (75 y.o. [name] [name] Primary Care Provider: [name], [name] Other Clinician: Referring Provider: Treating Provider/Extender: [name] [name] [name] [name] Weeks in Treatment: [phone] Verbal /  Phone  Orders: No Diagnosis Coding Follow-up Appointments Return Appointment in 1 week. Bathing/ Shower/ Hygiene May shower with protection but do not get wound dressing(s) wet. Edema Control - Lymphedema / SCD / Other Elevate legs to the level of the heart or above for 30 minutes daily and/or when sitting, a frequency of: Avoid standing for long periods of time. Off-Loading Other: - multipodus boots to both feet Wound Treatment Wound #14 - Lower Leg Wound Laterality: Left, Lateral Cleanser: Wound Cleanser (Generic) 1 x Per Week/15 Days Discharge Instructions: Cleanse the wound with wound cleanser prior to applying a clean dressing using gauze sponges, not tissue or cotton balls. Cleanser: Soap and Water 1 x Per Week/15 Days Discharge Instructions: May shower and wash wound with dial antibacterial soap and water prior to dressing change. Peri-Wound Care: Sween Lotion (Moisturizing lotion) 1 x Per Week/15 Days Discharge Instructions: Apply moisturizing lotion as directed Prim Dressing: KerraCel Ag Gelling Fiber Dressing, 4x5 in (silver alginate) (Generic) 1 x Per Week/15 Days ary Discharge Instructions: Apply silver alginate to wound bed as instructed Secondary Dressing: Woven Gauze Sponge, Non-Sterile 4x4 in (Generic) 1 x Per Week/15 Days Discharge Instructions: Apply over primary dressing as directed. Secondary Dressing: ABD Pad, 5x9 1 x Per Week/15 Days Discharge Instructions: Apply over primary dressing as directed. Compression Wrap: Kerlix Roll 4.5x3.1 (in/yd) (Generic) 1 x Per Week/15 Days Discharge Instructions: Apply Kerlix and Coban compression as directed. Compression Wrap: Coban Self-Adherent Wrap 4x5 (in/yd) (Generic) 1 x Per Week/15 Days Discharge Instructions: Apply over Kerlix as directed. Wound #25 - Lower Leg Wound Laterality: Right, Lateral Cleanser: Wound Cleanser (Generic) 1 x Per Week/15 Days Discharge Instructions: Cleanse the wound with wound cleanser prior to applying  a clean dressing using gauze sponges, not tissue or cotton balls. Cleanser: Soap and Water 1 x Per Week/15 Days Discharge Instructions: May shower and wash wound with dial antibacterial soap and water prior to dressing change. Peri-Wound Care: Sween Lotion (Moisturizing lotion) 1 x Per Week/15 Days Discharge Instructions: Apply moisturizing lotion as directed Prim Dressing: KerraCel Ag Gelling Fiber Dressing, 4x5 in (silver alginate) (Generic) 1 x Per Week/15 Days ary Discharge Instructions: Apply silver alginate to wound bed as instructed Secondary Dressing: Woven Gauze Sponge, Non-Sterile 4x4 in (Generic) 1 x Per Week/15 Days Discharge Instructions: Apply over primary dressing as directed. Secondary Dressing: ABD Pad, 5x9 1 x Per Week/15 Days Discharge Instructions: Apply over primary dressing as directed. Compression Wrap: Kerlix Roll 4.5x3.1 (in/yd) (Generic) 1 x Per Week/15 Days Discharge Instructions: Apply Kerlix and Coban compression as directed. Compression Wrap: Coban Self-Adherent Wrap 4x5 (in/yd) (Generic) 1 x Per Week/15 Days Discharge Instructions: Apply over Kerlix as directed. Wound #26 - Foot Wound Laterality: Dorsal, Left Cleanser: Wound Cleanser (Generic) 1 x Per Week/15 Days Discharge Instructions: Cleanse the wound with wound cleanser prior to applying a clean dressing using gauze sponges, not tissue or cotton balls. Cleanser: Soap and Water 1 x Per Week/15 Days Discharge Instructions: May shower and wash wound with dial antibacterial soap and water prior to dressing change. Peri-Wound Care: Sween Lotion (Moisturizing lotion) 1 x Per Week/15 Days Discharge Instructions: Apply moisturizing lotion as directed Prim Dressing: KerraCel Ag Gelling Fiber Dressing, 4x5 in (silver alginate) (Generic) 1 x Per Week/15 Days ary Discharge Instructions: Apply silver alginate to wound bed as instructed Secondary Dressing: Woven Gauze Sponge, Non-Sterile 4x4 in (Generic) 1 x Per  Week/15 Days Discharge Instructions: Apply over primary dressing as directed. Secondary Dressing: ABD Pad, 5x9 1 x  Per Week/15 Days Discharge Instructions: Apply over primary dressing as directed. Compression Wrap: Kerlix Roll 4.5x3.1 (in/yd) (Generic) 1 x Per Week/15 Days Discharge Instructions: Apply Kerlix and Coban compression as directed. Compression Wrap: Coban Self-Adherent Wrap 4x5 (in/yd) (Generic) 1 x Per Week/15 Days Discharge Instructions: Apply over Kerlix as directed. Wound #28 - Foot Wound Laterality: Dorsal, Right Cleanser: Wound Cleanser (Generic) 1 x Per Week/15 Days Discharge Instructions: Cleanse the wound with wound cleanser prior to applying a clean dressing using gauze sponges, not tissue or cotton balls. Cleanser: Soap and Water 1 x Per Week/15 Days Discharge Instructions: May shower and wash wound with dial antibacterial soap and water prior to dressing change. Peri-Wound Care: Sween Lotion (Moisturizing lotion) 1 x Per Week/15 Days Discharge Instructions: Apply moisturizing lotion as directed Prim Dressing: KerraCel Ag Gelling Fiber Dressing, 4x5 in (silver alginate) (Generic) 1 x Per Week/15 Days ary Discharge Instructions: Apply silver alginate to wound bed as instructed Secondary Dressing: Woven Gauze Sponge, Non-Sterile 4x4 in (Generic) 1 x Per Week/15 Days Discharge Instructions: Apply over primary dressing as directed. Secondary Dressing: ABD Pad, 5x9 1 x Per Week/15 Days Discharge Instructions: Apply over primary dressing as directed. Compression Wrap: Kerlix Roll 4.5x3.1 (in/yd) (Generic) 1 x Per Week/15 Days Discharge Instructions: Apply Kerlix and Coban compression as directed. Compression Wrap: Coban Self-Adherent Wrap 4x5 (in/yd) (Generic) 1 x Per Week/15 Days Discharge Instructions: Apply over Kerlix as directed. Wound #29 - Calcaneus Wound Laterality: Right Cleanser: Wound Cleanser (Generic) 1 x Per Week/15 Days Discharge Instructions: Cleanse the  wound with wound cleanser prior to applying a clean dressing using gauze sponges, not tissue or cotton balls. Cleanser: Soap and Water 1 x Per Week/15 Days Discharge Instructions: May shower and wash wound with dial antibacterial soap and water prior to dressing change. Peri-Wound Care: Sween Lotion (Moisturizing lotion) 1 x Per Week/15 Days Discharge Instructions: Apply moisturizing lotion as directed Secondary Dressing: Woven Gauze Sponge, Non-Sterile 4x4 in (Generic) 1 x Per Week/15 Days Discharge Instructions: Apply over primary dressing as directed. Secondary Dressing: ABD Pad, 5x9 1 x Per Week/15 Days Discharge Instructions: Apply over primary dressing as directed. Compression Wrap: Kerlix Roll 4.5x3.1 (in/yd) (Generic) 1 x Per Week/15 Days Discharge Instructions: Apply Kerlix and Coban compression as directed. Compression Wrap: Coban Self-Adherent Wrap 4x5 (in/yd) (Generic) 1 x Per Week/15 Days Discharge Instructions: Apply over Kerlix as directed. Wound #32 - Foot Wound Laterality: Dorsal, Left, Distal Cleanser: Wound Cleanser (Generic) 1 x Per Week/15 Days Discharge Instructions: Cleanse the wound with wound cleanser prior to applying a clean dressing using gauze sponges, not tissue or cotton balls. Cleanser: Soap and Water 1 x Per Week/15 Days Discharge Instructions: May shower and wash wound with dial antibacterial soap and water prior to dressing change. Peri-Wound Care: Sween Lotion (Moisturizing lotion) 1 x Per Week/15 Days Discharge Instructions: Apply moisturizing lotion as directed Prim Dressing: KerraCel Ag Gelling Fiber Dressing, 2x2 in (silver alginate) (Generic) 1 x Per Week/15 Days ary Discharge Instructions: Apply silver alginate to wound bed as instructed Secondary Dressing: Woven Gauze Sponge, Non-Sterile 4x4 in (Generic) 1 x Per Week/15 Days Discharge Instructions: Apply over primary dressing as directed. Secondary Dressing: ABD Pad, 5x9 1 x Per Week/15  Days Discharge Instructions: Apply over primary dressing as directed. Compression Wrap: Kerlix Roll 4.5x3.1 (in/yd) (Generic) 1 x Per Week/15 Days Discharge Instructions: Apply Kerlix and Coban compression as directed. Compression Wrap: Coban Self-Adherent Wrap 4x5 (in/yd) (Generic) 1 x Per Week/15 Days Discharge Instructions: Apply over Kerlix  as directed. Wound #33 - Foot Wound Laterality: Dorsal, Right, Distal Cleanser: Wound Cleanser (Generic) 1 x Per Week/15 Days Discharge Instructions: Cleanse the wound with wound cleanser prior to applying a clean dressing using gauze sponges, not tissue or cotton balls. Cleanser: Soap and Water 1 x Per Week/15 Days Discharge Instructions: May shower and wash wound with dial antibacterial soap and water prior to dressing change. Peri-Wound Care: Sween Lotion (Moisturizing lotion) 1 x Per Week/15 Days Discharge Instructions: Apply moisturizing lotion as directed Prim Dressing: KerraCel Ag Gelling Fiber Dressing, 2x2 in (silver alginate) (Generic) 1 x Per Week/15 Days ary Discharge Instructions: Apply silver alginate to wound bed as instructed Secondary Dressing: Woven Gauze Sponge, Non-Sterile 4x4 in (Generic) 1 x Per Week/15 Days Discharge Instructions: Apply over primary dressing as directed. Secondary Dressing: ABD Pad, 5x9 1 x Per Week/15 Days Discharge Instructions: Apply over primary dressing as directed. Compression Wrap: Kerlix Roll 4.5x3.1 (in/yd) (Generic) 1 x Per Week/15 Days Discharge Instructions: Apply Kerlix and Coban compression as directed. Compression Wrap: Coban Self-Adherent Wrap 4x5 (in/yd) (Generic) 1 x Per Week/15 Days Discharge Instructions: Apply over Kerlix as directed. Wound #34 - Foot Wound Laterality: Dorsal, Right, Medial Cleanser: Wound Cleanser (Generic) 1 x Per Week/15 Days Discharge Instructions: Cleanse the wound with wound cleanser prior to applying a clean dressing using gauze sponges, not tissue or cotton  balls. Cleanser: Soap and Water 1 x Per Week/15 Days Discharge Instructions: May shower and wash wound with dial antibacterial soap and water prior to dressing change. Peri-Wound Care: Sween Lotion (Moisturizing lotion) 1 x Per Week/15 Days Discharge Instructions: Apply moisturizing lotion as directed Prim Dressing: KerraCel Ag Gelling Fiber Dressing, 4x5 in (silver alginate) (Generic) 1 x Per Week/15 Days ary Discharge Instructions: Apply silver alginate to wound bed as instructed Secondary Dressing: Woven Gauze Sponge, Non-Sterile 4x4 in (Generic) 1 x Per Week/15 Days Discharge Instructions: Apply over primary dressing as directed. Secondary Dressing: ABD Pad, 5x9 1 x Per Week/15 Days Discharge Instructions: Apply over primary dressing as directed. Compression Wrap: Kerlix Roll 4.5x3.1 (in/yd) (Generic) 1 x Per Week/15 Days Discharge Instructions: Apply Kerlix and Coban compression as directed. Compression Wrap: Coban Self-Adherent Wrap 4x5 (in/yd) (Generic) 1 x Per Week/15 Days Discharge Instructions: Apply over Kerlix as directed. Wound #35 - Foot Wound Laterality: Dorsal, Left, Lateral Cleanser: Wound Cleanser (Generic) 1 x Per Week/15 Days Discharge Instructions: Cleanse the wound with wound cleanser prior to applying a clean dressing using gauze sponges, not tissue or cotton balls. Cleanser: Soap and Water 1 x Per Week/15 Days Discharge Instructions: May shower and wash wound with dial antibacterial soap and water prior to dressing change. Peri-Wound Care: Sween Lotion (Moisturizing lotion) 1 x Per Week/15 Days Discharge Instructions: Apply moisturizing lotion as directed Prim Dressing: KerraCel Ag Gelling Fiber Dressing, 2x2 in (silver alginate) (Generic) 1 x Per Week/15 Days ary Discharge Instructions: Apply silver alginate to wound bed as instructed Secondary Dressing: Woven Gauze Sponge, Non-Sterile 4x4 in (Generic) 1 x Per Week/15 Days Discharge Instructions: Apply over primary  dressing as directed. Secondary Dressing: ABD Pad, 5x9 1 x Per Week/15 Days Discharge Instructions: Apply over primary dressing as directed. Compression Wrap: Kerlix Roll 4.5x3.1 (in/yd) (Generic) 1 x Per Week/15 Days Discharge Instructions: Apply Kerlix and Coban compression as directed. Compression Wrap: Coban Self-Adherent Wrap 4x5 (in/yd) (Generic) 1 x Per Week/15 Days Discharge Instructions: Apply over Kerlix as directed. Patient Medications llergies: Sulfa A Notifications Medication Indication Start End 06/21/2020 doxycycline monohydrate DOSE oral 100 mg  capsule - 1 capsule oral bid for 10 days Electronic Signature(s) Signed: 06/21/2020 12:57:04 PM By: Linton Ham MD Entered By: Linton Ham on 06/21/2020 12:56:58 -------------------------------------------------------------------------------- Problem List Details Patient Name: Date of Service: [name] [name] F. 06/21/2020 11:00 A M Medical Record Number: [id number] Patient Account Number: [id number] Date of Birth/Sex: Treating RN: 1945/11/28 (75 y.o. [name], [name] Primary Care Provider: [name], [name] Other Clinician: Referring Provider: Treating Provider/Extender: [name] [name] [name] [name] Weeks in Treatment: 218 Active Problems ICD-10 Encounter Code Description Active Date MDM Diagnosis E11.621 Type 2 diabetes mellitus with foot ulcer 04/17/2016 No Yes L89.613 Pressure ulcer of right heel, stage 3 04/17/2016 No Yes L97.518 Non-pressure chronic ulcer of other part of right foot with other specified 05/17/2020 No Yes severity L97.221 Non-pressure chronic ulcer of left calf limited to breakdown of skin 08/15/2017 No Yes L97.321 Non-pressure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 patient comes in today with improved areas on the left calf and the dorsal left foot/ankle. However he has a reopening  on the right lateral calcaneus. 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 He is mystified about how this would have happened although looking at how he lies in the examining table is exactly how this would have happened although he claims he uses bunny boots at night etc. We have been using Sorbact to all wound areas. He had a significant excoriated rash on his left leg when he was here last time. We changed from Kerlix to cotton under the Coban and this seems to have helped 2/2; 1 month follow-up. The patient has open areas on the left lateral tibial area mid lower leg, left dorsal foot/ankle and an area on the right lateral calcaneus. The right lateral calcaneus is better as is the left dorsal foot/ankle. The area on the left lateral tibia however is larger. 3/9; 1 month follow-up. The left lateral tibia looks mostly the same. Left dorsal foot and ankle improved and the area on the right lateral calcaneus is quite a bit worse. He has had previous arterial studies last year that were fairly normal. I continue to think that these are pressure areas. We have been using silver alginate on all the wounds 3/30; 3-week follow-up. ooThe  left lateral tibia is still open but with less width. ooLeft dorsal foot small punched out wound. ooHe has a large area on his right lateral calcaneus and an area that I did not pick up on last time on the tip of the left calcaneus ooAs well he has a new area on the right lateral calf which was new today. 4/13; this is a patient with multiple medical problems. He has been in this clinic for a very prolonged period of time with different wounds in different areas in his lower extremities. Currently we have the left lateral tibia, left heel, left dorsal foot, right lateral heel and right lateral lower tibia. We have been using Santyl on the right and silver alginate on the left He has his daughter who changes his dressings. The reason behind the nonhealing here and ulcer recurrence is really never been clear to me. The patient is debilitated. He uses bunny boots apparently at night to protect his heels and feet. His arterial studies have previously not suggested a primary arterial etiology 4/27 2-week follow-up. He has a new wound on the right anterior patella currently trauma against the wheelchair. Failing this had he has areas on the right lateral calf and right lateral calcaneus both of which we have been using Santyl. On the left side one of the original wounds on the left lateral tibia area, left calcaneus, punched out areas on the left dorsal foot. On the left we have been using PolyMem Ag 5/11; 2-week follow-up. He does not have any new wounds this week and most of his wounds look better. Using Santyl on the right lateral heel and right lateral ankle and polymen Ag and everything else under compression. 09/15/19-Patient at 2-week follow-up has to the wounds about the same and the rest of the wounds smaller in dimensions, the left posterior wound is about the same, one of the wounds is healed on the right knee. 6/8; 2-week follow-up. His left lateral calf wound is now split into 2. He has an  area on the left heel which is a pressure ulcer which I think is improving he also had the wrap injury areas on the crease  of his left ankle/foot that are better. Still an area on the right heel that is also improved we have been using PolyMem Ag 6/22; 2-week follow-up. The left lateral calf wound measures about the same. ooLeft heel is closed ooRight heel still open but looks better. ooAssorted wounds on the dorsal foot and ankle 1 of these requires debridement small wound with some depth. ooWe have been using PolyMem Ag to all wound areas 7/13; ooLeft heel is closed ooRight heel is open but looks smaller ooSmall area on the right lateral calf looks smaller ooSmattering of wounds on the dorsal left foot and ankle. All of these are superficial ooThe substantial area on the left lateral mid tibia. Necrotic debris on the surface. I really do not completely understand this wound. Not making any progress. We have been using PolyMem Ag to all the wounds 8/10-Patient returns at 1 month, the left dorsal foot and ankle wounds are about the same, the left lateral wound on the leg definitely bigger, using PolyMem to all the wounds except the left lateral leg wound where Iodoflex is being tried. Overall these wounds seem to fluctuate in the progress with no clear resolution 8/31; I have not seen this man in over a month and a half. His area on the left leg is much more substantial necrotic surface much longer. Our staff noted purulent drainage. He still has breakdown in the left foot and ankle I am not quite sure how to explain this. ooOn the right side his right lateral heel is just about closed closed. Small area on the right lateral calf. ooHis left heel is also closed 9/13; 2-week follow-up. Because of the increase in size and the condition of the wound on the left lateral leg I did a PCR culture on this/deep tissue culture last time this showed methicillin resistant staph aureus and group  B strep. I use clindamycin, the patient is allergic to Septra and there was a tetracycline resistant gene detected as well 9/27; 2-week follow-up. Left lateral leg looks stable to improved slightly smaller. He has a scattering of areas on his left dorsal foot but nothing on the left heel. On the right heel a small open area on the lateral aspect and also on the lateral part of his lower leg. We have been using silver alginate under compression As I have often done in this man I feel no reviewed his arterial studies which were last looked at in June 2020 at that point he had an ABI in the right of 0.93 monophasic waveforms however but the great toe pressure was 110 with a TBI of 0.89 on the left his ABI was 0.84 with a great toe pressure of 120 and a TBI of 0.97. This was indicative of mild arterial disease 10/26; the patient arrives with the wounds on his bilateral lower extremities looking as good as I have seen him in quite some time. First of all the lateral heels are both healed. There is callus here however no open wounds. The substantial area on his left upper lateral tibia which has been so refractory looks a lot better. He still has multiple small open areas on the dorsal left foot and ankle. One of these at the crease of the foot and ankle is actually quite deep and has debris in the center. On the right leg he only has one small open area on the right lateral leg this is just about closed We apparently had the order of silver alginate they  come in with polymen Ag. However given the fact that things look so much better I am going to continue with the latter 04/26/2020; patient returns to the clinic after about a 9 to 10-week hiatus. I am not exactly sure why. The patient says he had a cold and was sick. He was apparently tested for Covid but was negative. He literally dropped off our schedule. He arrives back in clinic with a multitude of wounds especially on the left dorsal foot and lower leg.  There are also areas on the right dorsal foot and the right lower leg. Fortunately the pressure ulcers on the bilateral lateral heels of once again closed. Again the history here is vague. Apparently everything is deteriorated on the left greater than right over the last 2 to 3 weeks. His daughter wondered whether they were not wrapping this properly they have been applying topical antibiotic and a kerlix Coban wrap. He absolutely denies any pain 1/11; I put him under our kerlix, Coban wrap and the edema control is a lot better. They have not been changing this at home. There has been some improvement in his wounds except on the left dorsal foot and ankle. In fact he has a new wound on the left dorsal foot. The last time he was here I reviewed his past arterial studies and I am going to repeat them. Through this timeframe the wounds on his bilateral heels, pressure ulcers have actually managed to heal. 1/25; patient arrives with by my calculation new wounds on the right dorsal foot right heel. He has the area on his left ankle with exposed tendon that is larger he has the chronic area on the left lateral upper tibia which is no closer to healing. He has another area on the right lateral heel. We have been using silver alginate to everything except the tendon area on the left ankle He is going for his arterial studies this afternoon these are repeat from those that were done in June 2020. I am not sure I am convinced these are venous wounds 2/8; the patient had his REPEAT ARTERIAL STUDIES not much worse than previously but with the same pattern. On the right side his ABI was 0.89 with a TBI of 0.48 and monophasic waveforms on the left side ABI of 0.92 but with a TBI of 0.42 and monophasic biphasic waveforms the great toe pressure on the right was 57 on the left 50. I am wondering whether this represents falsely elevated ABIs from medial vessel calcification plus or minus microvascular disease. He has  nonhealing wounds on the left dorsal foot with exposed tendon left dorsal foot distally in the left lateral calf. I have not been able to do anything with these wounds. We have been using silver collagen in the feet silver alginate in the legs He was in the ER last week and missed his appointment. He was felt to have influenza A I believe. I note from his lab work that his creatinine was 2.34 indicative of stage IV chronic renal failure he also had significant hypoalbuminemia. Certainly his creatinine is going to take make any thought about angiogram very risky here. 2/22; patient comes in today with 2 new wounds on the left dorsal foot both of which probes to bone. There is small area in the midfoot had some serosanguineous drainage which I cultured punched-out area on the right dorsal foot as well but these do not go to bone. He has a small pressure area on the right  heel Goes to see vascular surgery today I am wondering if he needs an angiogram [question CO2 angiogram]. I am at a loss to really explain not only the nonhealing area on the left lateral calf but the constant breakdown in his dorsal feet. 2/1; patient saw Dr. Stanford [facility] He feels his ABIs were falsely elevated because of his medial calcification. He is being planned for CO2 angiography. Culture I did last time showed methicillin resistant staph aureus from the left foot. I gave him on Augmentin this will obviously not work I am going to put him on doxycycline. He apparently also had an x-ray I will need to research the result of this Objective Constitutional Sitting or standing Blood Pressure is within target range for patient.. Pulse regular and within target range for patient.Marland Kitchen Respirations regular, non-labored and within target range.. Temperature is normal and within the target range for the patient.Marland Kitchen Appears in no distress. Vitals Time Taken: 11:27 AM, Height: 74 in, Weight: 150 lbs, BMI: 19.3, Temperature: 98.4  F, Pulse: 76 bpm, Respiratory Rate: 18 breaths/min, Blood Pressure: 113/66 mmHg, Capillary Blood Glucose: 123 mg/dl. General Notes: Wound exam; his left dorsal foot is still angry with exposed tendon on the dorsal ankle wound to areas distally seem about the same. There is some erythema on the foot possibly related to infection. He has a punched-out area on the right lateral heel and the left lateral calf looks about the same. Integumentary (Hair, Skin) Wound #14 status is Open. Original cause of wound was Gradually Appeared. The date acquired was: 08/15/2017. The wound has been in treatment 148 weeks. The wound is located on the Left,Lateral Lower Leg. The wound measures 10.3cm length x 0.5cm width x 0.4cm depth; 4.045cm^2 area and 1.618cm^3 volume. There is Fat Layer (Subcutaneous Tissue) exposed. There is no tunneling or undermining noted. There is a large amount of serosanguineous drainage noted. The wound margin is flat and intact. There is medium (34-66%) red granulation within the wound bed. There is a medium (34-66%) amount of necrotic tissue within the wound bed including Eschar and Adherent Slough. Wound #25 status is Open. Original cause of wound was Gradually Appeared. The date acquired was: 07/06/2019. The wound has been in treatment 48 weeks. The wound is located on the Right,Lateral Lower Leg. The wound measures 2cm length x 0.8cm width x 0.1cm depth; 1.257cm^2 area and 0.126cm^3 volume. There is Fat Layer (Subcutaneous Tissue) exposed. There is no tunneling or undermining noted. There is a medium amount of serosanguineous drainage noted. The wound margin is flat and intact. There is large (67-100%) red granulation within the wound bed. There is a small (1-33%) amount of necrotic tissue within the wound bed including Adherent Slough. Wound #26 status is Open. Original cause of wound was Gradually Appeared. The date acquired was: 08/18/2019. The wound has been in treatment 44 weeks. The  wound is located on the Left,Dorsal Foot. The wound measures 3.4cm length x 1.5cm width x 0.4cm depth; 4.006cm^2 area and 1.602cm^3 volume. There is tendon and Fat Layer (Subcutaneous Tissue) exposed. There is no tunneling or undermining noted. There is a medium amount of serosanguineous drainage noted. The wound margin is flat and intact. There is small (1-33%) red, pink granulation within the wound bed. There is a large (67-100%) amount of necrotic tissue within the wound bed including Eschar and Adherent Slough. Wound #28 status is Open. Original cause of wound was Gradually Appeared. The date acquired was: 04/26/2020. The wound has been  in treatment 8 weeks. The wound is located on the Right,Dorsal Foot. The wound measures 0.3cm length x 0.3cm width x 0.1cm depth; 0.071cm^2 area and 0.007cm^3 volume. There is Fat Layer (Subcutaneous Tissue) exposed. There is no tunneling or undermining noted. There is a small amount of serosanguineous drainage noted. The wound margin is flat and intact. There is small (1-33%) pink granulation within the wound bed. There is a large (67-100%) amount of necrotic tissue within the wound bed including Adherent Slough. Wound #29 status is Open. Original cause of wound was Gradually Appeared. The date acquired was: 04/26/2020. The wound has been in treatment 8 weeks. The wound is located on the Right Calcaneus. The wound measures 0.7cm length x 0.9cm width x 0.1cm depth; 0.495cm^2 area and 0.049cm^3 volume. There is Fat Layer (Subcutaneous Tissue) exposed. There is no tunneling or undermining noted. There is a small amount of serosanguineous drainage noted. The wound margin is flat and intact. There is large (67-100%) pink granulation within the wound bed. There is no necrotic tissue within the wound bed. Wound #31 status is Healed - Epithelialized. Original cause of wound was Gradually Appeared. The date acquired was: 04/24/2020. The wound has been in treatment 8 weeks. The  wound is located on the Right Knee. The wound measures 0cm length x 0cm width x 0cm depth; 0cm^2 area and 0cm^3 volume. Wound #32 status is Open. Original cause of wound was Gradually Appeared. The date acquired was: 05/03/2020. The wound has been in treatment 7 weeks. The wound is located on the Left,Distal,Dorsal Foot. The wound measures 1.4cm length x 1cm width x 0.2cm depth; 1.1cm^2 area and 0.22cm^3 volume. There is Fat Layer (Subcutaneous Tissue) exposed. There is no tunneling or undermining noted. There is a medium amount of serosanguineous drainage noted. The wound margin is flat and intact. There is small (1-33%) pink granulation within the wound bed. There is a large (67-100%) amount of necrotic tissue within the wound bed including Adherent Slough. Wound #33 status is Open. Original cause of wound was Gradually Appeared. The date acquired was: 05/17/2020. The wound has been in treatment 5 weeks. The wound is located on the Right,Distal,Dorsal Foot. The wound measures 0.3cm length x 0.4cm width x 0.1cm depth; 0.094cm^2 area and 0.009cm^3 volume. There is Fat Layer (Subcutaneous Tissue) exposed. There is no tunneling or undermining noted. There is a small amount of serosanguineous drainage noted. The wound margin is flat and intact. There is no granulation within the wound bed. There is a large (67-100%) amount of necrotic tissue within the wound bed including Adherent Slough. Wound #34 status is Open. Original cause of wound was Gradually Appeared. The date acquired was: 06/14/2020. The wound has been in treatment 1 weeks. The wound is located on the Right,Medial,Dorsal Foot. The wound measures 0.6cm length x 0.5cm width x 0.1cm depth; 0.236cm^2 area and 0.024cm^3 volume. There is Fat Layer (Subcutaneous Tissue) exposed. There is no tunneling or undermining noted. There is a small amount of serosanguineous drainage noted. The wound margin is flat and intact. There is no granulation within the  wound bed. There is a large (67-100%) amount of necrotic tissue within the wound bed including Eschar and Adherent Slough. Wound #35 status is Open. Original cause of wound was Gradually Appeared. The date acquired was: 05/24/2020. The wound has been in treatment 1 weeks. The wound is located on the Left,Lateral,Dorsal Foot. The wound measures 0.7cm length x 0.5cm width x 0.9cm depth; 0.275cm^2 area and 0.247cm^3 volume. There is bone  and Fat Layer (Subcutaneous Tissue) exposed. There is no tunneling or undermining noted. There is a medium amount of purulent drainage noted. The wound margin is well defined and not attached to the wound base. There is medium (34-66%) red, friable, hyper - granulation within the wound bed. There is a medium (34-66%) amount of necrotic tissue within the wound bed including Adherent Slough. Assessment Active Problems ICD-10 Type 2 diabetes mellitus with foot ulcer Pressure ulcer of right heel, stage 3 Non-pressure chronic ulcer of other part of right foot with other specified severity Non-pressure chronic ulcer of left calf limited to breakdown of skin Non-pressure chronic ulcer of left ankle limited to breakdown of skin Non-pressure chronic ulcer of other part of left foot limited to breakdown of skin Non-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 Plan Follow-up Appointments: Return Appointment in 1 week. Bathing/ Shower/ Hygiene: May shower with protection but do not get wound dressing(s) wet. Edema Control - Lymphedema / SCD / Other: Elevate legs to the level of the heart or above for 30 minutes daily and/or when sitting, a frequency of: Avoid standing for long periods of time. Off-Loading: Other: - multipodus boots to both feet The following medication(s) was prescribed: doxycycline monohydrate oral 100 mg capsule 1 capsule oral bid for 10 days starting 06/21/2020 WOUND #14: - Lower Leg Wound Laterality: Left, Lateral Cleanser:  Wound Cleanser (Generic) 1 x Per Week/15 Days Discharge Instructions: Cleanse the wound with wound cleanser prior to applying a clean dressing using gauze sponges, not tissue or cotton balls. Cleanser: Soap and Water 1 x Per Week/15 Days Discharge Instructions: May shower and wash wound with dial antibacterial soap and water prior to dressing change. Peri-Wound Care: Sween Lotion (Moisturizing lotion) 1 x Per Week/15 Days Discharge Instructions: Apply moisturizing lotion as directed Prim Dressing: KerraCel Ag Gelling Fiber Dressing, 4x5 in (silver alginate) (Generic) 1 x Per Week/15 Days ary Discharge Instructions: Apply silver alginate to wound bed as instructed Secondary Dressing: Woven Gauze Sponge, Non-Sterile 4x4 in (Generic) 1 x Per Week/15 Days Discharge Instructions: Apply over primary dressing as directed. Secondary Dressing: ABD Pad, 5x9 1 x Per Week/15 Days Discharge Instructions: Apply over primary dressing as directed. Com pression Wrap: Kerlix Roll 4.5x3.1 (in/yd) (Generic) 1 x Per Week/15 Days Discharge Instructions: Apply Kerlix and Coban compression as directed. Com pression Wrap: Coban Self-Adherent Wrap 4x5 (in/yd) (Generic) 1 x Per Week/15 Days Discharge Instructions: Apply over Kerlix as directed. WOUND #25: - Lower Leg Wound Laterality: Right, Lateral Cleanser: Wound Cleanser (Generic) 1 x Per Week/15 Days Discharge Instructions: Cleanse the wound with wound cleanser prior to applying a clean dressing using gauze sponges, not tissue or cotton balls. Cleanser: Soap and Water 1 x Per Week/15 Days Discharge Instructions: May shower and wash wound with dial antibacterial soap and water prior to dressing change. Peri-Wound Care: Sween Lotion (Moisturizing lotion) 1 x Per Week/15 Days Discharge Instructions: Apply moisturizing lotion as directed Prim Dressing: KerraCel Ag Gelling Fiber Dressing, 4x5 in (silver alginate) (Generic) 1 x Per Week/15 Days ary Discharge  Instructions: Apply silver alginate to wound bed as instructed Secondary Dressing: Woven Gauze Sponge, Non-Sterile 4x4 in (Generic) 1 x Per Week/15 Days Discharge Instructions: Apply over primary dressing as directed. Secondary Dressing: ABD Pad, 5x9 1 x Per Week/15 Days Discharge Instructions: Apply over primary dressing as directed. Com pression Wrap: Kerlix Roll 4.5x3.1 (in/yd) (Generic) 1 x Per Week/15 Days Discharge Instructions: Apply Kerlix and Coban compression as directed.  Com pression Wrap: Coban Self-Adherent Wrap 4x5 (in/yd) (Generic) 1 x Per Week/15 Days Discharge Instructions: Apply over Kerlix as directed. WOUND #26: - Foot Wound Laterality: Dorsal, Left Cleanser: Wound Cleanser (Generic) 1 x Per Week/15 Days Discharge Instructions: Cleanse the wound with wound cleanser prior to applying a clean dressing using gauze sponges, not tissue or cotton balls. Cleanser: Soap and Water 1 x Per Week/15 Days Discharge Instructions: May shower and wash wound with dial antibacterial soap and water prior to dressing change. Peri-Wound Care: Sween Lotion (Moisturizing lotion) 1 x Per Week/15 Days Discharge Instructions: Apply moisturizing lotion as directed Prim Dressing: KerraCel Ag Gelling Fiber Dressing, 4x5 in (silver alginate) (Generic) 1 x Per Week/15 Days ary Discharge Instructions: Apply silver alginate to wound bed as instructed Secondary Dressing: Woven Gauze Sponge, Non-Sterile 4x4 in (Generic) 1 x Per Week/15 Days Discharge Instructions: Apply over primary dressing as directed. Secondary Dressing: ABD Pad, 5x9 1 x Per Week/15 Days Discharge Instructions: Apply over primary dressing as directed. Com pression Wrap: Kerlix Roll 4.5x3.1 (in/yd) (Generic) 1 x Per Week/15 Days Discharge Instructions: Apply Kerlix and Coban compression as directed. Com pression Wrap: Coban Self-Adherent Wrap 4x5 (in/yd) (Generic) 1 x Per Week/15 Days Discharge Instructions: Apply over Kerlix as  directed. WOUND #28: - Foot Wound Laterality: Dorsal, Right Cleanser: Wound Cleanser (Generic) 1 x Per Week/15 Days Discharge Instructions: Cleanse the wound with wound cleanser prior to applying a clean dressing using gauze sponges, not tissue or cotton balls. Cleanser: Soap and Water 1 x Per Week/15 Days Discharge Instructions: May shower and wash wound with dial antibacterial soap and water prior to dressing change. Peri-Wound Care: Sween Lotion (Moisturizing lotion) 1 x Per Week/15 Days Discharge Instructions: Apply moisturizing lotion as directed Prim Dressing: KerraCel Ag Gelling Fiber Dressing, 4x5 in (silver alginate) (Generic) 1 x Per Week/15 Days ary Discharge Instructions: Apply silver alginate to wound bed as instructed Secondary Dressing: Woven Gauze Sponge, Non-Sterile 4x4 in (Generic) 1 x Per Week/15 Days Discharge Instructions: Apply over primary dressing as directed. Secondary Dressing: ABD Pad, 5x9 1 x Per Week/15 Days Discharge Instructions: Apply over primary dressing as directed. Com pression Wrap: Kerlix Roll 4.5x3.1 (in/yd) (Generic) 1 x Per Week/15 Days Discharge Instructions: Apply Kerlix and Coban compression as directed. Com pression Wrap: Coban Self-Adherent Wrap 4x5 (in/yd) (Generic) 1 x Per Week/15 Days Discharge Instructions: Apply over Kerlix as directed. WOUND #29: - Calcaneus Wound Laterality: Right Cleanser: Wound Cleanser (Generic) 1 x Per Week/15 Days Discharge Instructions: Cleanse the wound with wound cleanser prior to applying a clean dressing using gauze sponges, not tissue or cotton balls. Cleanser: Soap and Water 1 x Per Week/15 Days Discharge Instructions: May shower and wash wound with dial antibacterial soap and water prior to dressing change. Peri-Wound Care: Sween Lotion (Moisturizing lotion) 1 x Per Week/15 Days Discharge Instructions: Apply moisturizing lotion as directed Secondary Dressing: Woven Gauze Sponge, Non-Sterile 4x4 in (Generic) 1  x Per Week/15 Days Discharge Instructions: Apply over primary dressing as directed. Secondary Dressing: ABD Pad, 5x9 1 x Per Week/15 Days Discharge Instructions: Apply over primary dressing as directed. Com pression Wrap: Kerlix Roll 4.5x3.1 (in/yd) (Generic) 1 x Per Week/15 Days Discharge Instructions: Apply Kerlix and Coban compression as directed. Com pression Wrap: Coban Self-Adherent Wrap 4x5 (in/yd) (Generic) 1 x Per Week/15 Days Discharge Instructions: Apply over Kerlix as directed. WOUND #32: - Foot Wound Laterality: Dorsal, Left, Distal Cleanser: Wound Cleanser (Generic) 1 x Per Week/15 Days Discharge Instructions: Cleanse the wound with  wound cleanser prior to applying a clean dressing using gauze sponges, not tissue or cotton balls. Cleanser: Soap and Water 1 x Per Week/15 Days Discharge Instructions: May shower and wash wound with dial antibacterial soap and water prior to dressing change. Peri-Wound Care: Sween Lotion (Moisturizing lotion) 1 x Per Week/15 Days Discharge Instructions: Apply moisturizing lotion as directed Prim Dressing: KerraCel Ag Gelling Fiber Dressing, 2x2 in (silver alginate) (Generic) 1 x Per Week/15 Days ary Discharge Instructions: Apply silver alginate to wound bed as instructed Secondary Dressing: Woven Gauze Sponge, Non-Sterile 4x4 in (Generic) 1 x Per Week/15 Days Discharge Instructions: Apply over primary dressing as directed. Secondary Dressing: ABD Pad, 5x9 1 x Per Week/15 Days Discharge Instructions: Apply over primary dressing as directed. Com pression Wrap: Kerlix Roll 4.5x3.1 (in/yd) (Generic) 1 x Per Week/15 Days Discharge Instructions: Apply Kerlix and Coban compression as directed. Com pression Wrap: Coban Self-Adherent Wrap 4x5 (in/yd) (Generic) 1 x Per Week/15 Days Discharge Instructions: Apply over Kerlix as directed. WOUND #33: - Foot Wound Laterality: Dorsal, Right, Distal Cleanser: Wound Cleanser (Generic) 1 x Per Week/15  Days Discharge Instructions: Cleanse the wound with wound cleanser prior to applying a clean dressing using gauze sponges, not tissue or cotton balls. Cleanser: Soap and Water 1 x Per Week/15 Days Discharge Instructions: May shower and wash wound with dial antibacterial soap and water prior to dressing change. Peri-Wound Care: Sween Lotion (Moisturizing lotion) 1 x Per Week/15 Days Discharge Instructions: Apply moisturizing lotion as directed Prim Dressing: KerraCel Ag Gelling Fiber Dressing, 2x2 in (silver alginate) (Generic) 1 x Per Week/15 Days ary Discharge Instructions: Apply silver alginate to wound bed as instructed Secondary Dressing: Woven Gauze Sponge, Non-Sterile 4x4 in (Generic) 1 x Per Week/15 Days Discharge Instructions: Apply over primary dressing as directed. Secondary Dressing: ABD Pad, 5x9 1 x Per Week/15 Days Discharge Instructions: Apply over primary dressing as directed. Com pression Wrap: Kerlix Roll 4.5x3.1 (in/yd) (Generic) 1 x Per Week/15 Days Discharge Instructions: Apply Kerlix and Coban compression as directed. Com pression Wrap: Coban Self-Adherent Wrap 4x5 (in/yd) (Generic) 1 x Per Week/15 Days Discharge Instructions: Apply over Kerlix as directed. WOUND #34: - Foot Wound Laterality: Dorsal, Right, Medial Cleanser: Wound Cleanser (Generic) 1 x Per Week/15 Days Discharge Instructions: Cleanse the wound with wound cleanser prior to applying a clean dressing using gauze sponges, not tissue or cotton balls. Cleanser: Soap and Water 1 x Per Week/15 Days Discharge Instructions: May shower and wash wound with dial antibacterial soap and water prior to dressing change. Peri-Wound Care: Sween Lotion (Moisturizing lotion) 1 x Per Week/15 Days Discharge Instructions: Apply moisturizing lotion as directed Prim Dressing: KerraCel Ag Gelling Fiber Dressing, 4x5 in (silver alginate) (Generic) 1 x Per Week/15 Days ary Discharge Instructions: Apply silver alginate to wound bed  as instructed Secondary Dressing: Woven Gauze Sponge, Non-Sterile 4x4 in (Generic) 1 x Per Week/15 Days Discharge Instructions: Apply over primary dressing as directed. Secondary Dressing: ABD Pad, 5x9 1 x Per Week/15 Days Discharge Instructions: Apply over primary dressing as directed. Com pression Wrap: Kerlix Roll 4.5x3.1 (in/yd) (Generic) 1 x Per Week/15 Days Discharge Instructions: Apply Kerlix and Coban compression as directed. Com pression Wrap: Coban Self-Adherent Wrap 4x5 (in/yd) (Generic) 1 x Per Week/15 Days Discharge Instructions: Apply over Kerlix as directed. WOUND #35: - Foot Wound Laterality: Dorsal, Left, Lateral Cleanser: Wound Cleanser (Generic) 1 x Per Week/15 Days Discharge Instructions: Cleanse the wound with wound cleanser prior to applying a clean dressing using gauze sponges, not tissue  or cotton balls. Cleanser: Soap and Water 1 x Per Week/15 Days Discharge Instructions: May shower and wash wound with dial antibacterial soap and water prior to dressing change. Peri-Wound Care: Sween Lotion (Moisturizing lotion) 1 x Per Week/15 Days Discharge Instructions: Apply moisturizing lotion as directed Prim Dressing: KerraCel Ag Gelling Fiber Dressing, 2x2 in (silver alginate) (Generic) 1 x Per Week/15 Days ary Discharge Instructions: Apply silver alginate to wound bed as instructed Secondary Dressing: Woven Gauze Sponge, Non-Sterile 4x4 in (Generic) 1 x Per Week/15 Days Discharge Instructions: Apply over primary dressing as directed. Secondary Dressing: ABD Pad, 5x9 1 x Per Week/15 Days Discharge Instructions: Apply over primary dressing as directed. Com pression Wrap: Kerlix Roll 4.5x3.1 (in/yd) (Generic) 1 x Per Week/15 Days Discharge Instructions: Apply Kerlix and Coban compression as directed. Com pression Wrap: Coban Self-Adherent Wrap 4x5 (in/yd) (Generic) 1 x Per Week/15 Days Discharge Instructions: Apply over Kerlix as directed. 1. We are using silver alginate to  all the wounds except the tendon exposed wound on the left dorsal ankle. 2. Silver collagen to the area with the tendon exposed 3. 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 I will need to check his x-ray which was apparently done this morning 5. He is going for angiography/CO2 angiography as directed by Dr. Jerry Caras. Hopefully they will find something in the left leg especially the can be dealt with in terms of macrovascular supply Electronic Signature(s) Signed: 06/21/2020 5:59:48 PM By: Linton Ham MD Entered By: Linton Ham on 06/21/2020 12:58:28 -------------------------------------------------------------------------------- SuperBill Details Patient Name: Date of Service: [name] [name] F. 06/21/2020 Medical Record Number: [id number] Patient Account Number: [id number] Date of Birth/Sex: Treating RN: 1945/08/22 (75 y.o. [name], [place] Primary Care Provider: [name], [name] Other Clinician: Referring Provider: Treating Provider/Extender: [name] [name] [name] [name] Weeks in Treatment: 218 Diagnosis Coding ICD-10 Codes Code Description E11.621 Type 2 diabetes mellitus with foot ulcer L89.613 Pressure ulcer of right heel, stage 3 L97.518 Non-pressure chronic ulcer of other part of right foot with other specified severity L97.221 Non-pressure chronic ulcer of left calf limited to breakdown of skin L97.321 Non-pressure chronic ulcer of left ankle limited to breakdown of skin L97.521 Non-pressure chronic ulcer of other part of left foot limited to breakdown of skin L97.211 Non-pressure chronic ulcer of right calf limited to breakdown of skin Facility Procedures CPT4 Code: [id number] Description: [id number] - WOUND CARE VISIT-LEV 5 EST PT Modifier: Quantity: 1 Physician Procedures : CPT4 Code Description Modifier [id number] - WC PHYS LEVEL 4 - EST PT ICD-10 Diagnosis Description L97.321 Non-pressure chronic ulcer of left  ankle limited to breakdown of skin L97.521 Non-pressure chronic ulcer of other part of left foot limited to  breakdown of skin L97.221 Non-pressure chronic ulcer of left calf limited to breakdown of skin E11.621 Type 2 diabetes mellitus with foot ulcer Quantity: 1 Electronic Signature(s) Signed: 06/21/2020 5:59:48 PM By: Linton Ham MD Entered By: Linton Ham on 06/21/2020 12:58:58

## 2020-06-23 ENCOUNTER — Encounter (HOSPITAL_BASED_OUTPATIENT_CLINIC_OR_DEPARTMENT_OTHER): Payer: Medicare HMO | Admitting: Internal Medicine

## 2020-06-23 ENCOUNTER — Ambulatory Visit (HOSPITAL_COMMUNITY)
Admission: RE | Admit: 2020-06-23 | Discharge: 2020-06-23 | Disposition: A | Discharge status: Home / Self Care | Payer: Medicare HMO | Attending: Vascular Surgery | Admitting: Vascular Surgery

## 2020-06-23 ENCOUNTER — Other Ambulatory Visit: Payer: Self-pay

## 2020-06-23 ENCOUNTER — Encounter (HOSPITAL_COMMUNITY)
Admission: RE | Disposition: A | Discharge status: Home / Self Care | Payer: Self-pay | Source: Home / Self Care | Attending: Vascular Surgery

## 2020-06-23 DIAGNOSIS — I70248 Atherosclerosis of native arteries of left leg with ulceration of other part of lower left leg: Secondary | ICD-10-CM | POA: Diagnosis not present

## 2020-06-23 DIAGNOSIS — L89522 Pressure ulcer of left ankle, stage 2: Secondary | ICD-10-CM | POA: Diagnosis not present

## 2020-06-23 DIAGNOSIS — Z7982 Long term (current) use of aspirin: Secondary | ICD-10-CM | POA: Diagnosis not present

## 2020-06-23 DIAGNOSIS — Z794 Long term (current) use of insulin: Secondary | ICD-10-CM | POA: Diagnosis not present

## 2020-06-23 DIAGNOSIS — Z888 Allergy status to other drugs, medicaments and biological substances status: Secondary | ICD-10-CM | POA: Insufficient documentation

## 2020-06-23 DIAGNOSIS — L97521 Non-pressure chronic ulcer of other part of left foot limited to breakdown of skin: Secondary | ICD-10-CM | POA: Diagnosis not present

## 2020-06-23 DIAGNOSIS — Z7902 Long term (current) use of antithrombotics/antiplatelets: Secondary | ICD-10-CM | POA: Insufficient documentation

## 2020-06-23 DIAGNOSIS — L97829 Non-pressure chronic ulcer of other part of left lower leg with unspecified severity: Secondary | ICD-10-CM | POA: Insufficient documentation

## 2020-06-23 DIAGNOSIS — L97221 Non-pressure chronic ulcer of left calf limited to breakdown of skin: Secondary | ICD-10-CM | POA: Diagnosis not present

## 2020-06-23 DIAGNOSIS — L89620 Pressure ulcer of left heel, unstageable: Secondary | ICD-10-CM | POA: Diagnosis not present

## 2020-06-23 DIAGNOSIS — E1151 Type 2 diabetes mellitus with diabetic peripheral angiopathy without gangrene: Secondary | ICD-10-CM | POA: Insufficient documentation

## 2020-06-23 DIAGNOSIS — I7025 Atherosclerosis of native arteries of other extremities with ulceration: Secondary | ICD-10-CM | POA: Diagnosis not present

## 2020-06-23 DIAGNOSIS — Z882 Allergy status to sulfonamides status: Secondary | ICD-10-CM | POA: Insufficient documentation

## 2020-06-23 DIAGNOSIS — L89623 Pressure ulcer of left heel, stage 3: Secondary | ICD-10-CM | POA: Diagnosis not present

## 2020-06-23 DIAGNOSIS — L89512 Pressure ulcer of right ankle, stage 2: Secondary | ICD-10-CM | POA: Diagnosis not present

## 2020-06-23 DIAGNOSIS — Z79899 Other long term (current) drug therapy: Secondary | ICD-10-CM | POA: Diagnosis not present

## 2020-06-23 DIAGNOSIS — L89613 Pressure ulcer of right heel, stage 3: Secondary | ICD-10-CM | POA: Diagnosis not present

## 2020-06-23 DIAGNOSIS — L97321 Non-pressure chronic ulcer of left ankle limited to breakdown of skin: Secondary | ICD-10-CM | POA: Diagnosis not present

## 2020-06-23 DIAGNOSIS — L97211 Non-pressure chronic ulcer of right calf limited to breakdown of skin: Secondary | ICD-10-CM | POA: Diagnosis not present

## 2020-06-23 HISTORY — PX: ABDOMINAL AORTOGRAM W/LOWER EXTREMITY: CATH118223

## 2020-06-23 LAB — POCT I-STAT, CHEM 8
BUN: 22 mg/dL (ref 8–23)
Calcium, Ion: 1.19 mmol/L (ref 1.15–1.40)
Chloride: 112 mmol/L — ABNORMAL HIGH (ref 98–111)
Creatinine, Ser: 1.6 mg/dL — ABNORMAL HIGH (ref 0.61–1.24)
Glucose, Bld: 103 mg/dL — ABNORMAL HIGH (ref 70–99)
HCT: 38 % — ABNORMAL LOW (ref 39.0–52.0)
Hemoglobin: 12.9 g/dL — ABNORMAL LOW (ref 13.0–17.0)
Potassium: 3.9 mmol/L (ref 3.5–5.1)
Sodium: 149 mmol/L — ABNORMAL HIGH (ref 135–145)
TCO2: 29 mmol/L (ref 22–32)

## 2020-06-23 LAB — GLUCOSE, CAPILLARY: Glucose-Capillary: 65 mg/dL — ABNORMAL LOW (ref 70–99)

## 2020-06-23 SURGERY — ABDOMINAL AORTOGRAM W/LOWER EXTREMITY
Anesthesia: LOCAL

## 2020-06-23 MED ORDER — ACETAMINOPHEN 325 MG PO TABS: 650.0000 mg | ORAL_TABLET | ORAL | Status: DC | PRN

## 2020-06-23 MED ORDER — LABETALOL HCL 5 MG/ML IV SOLN: 10.0000 mg | INTRAVENOUS | Status: DC | PRN

## 2020-06-23 MED ORDER — LIDOCAINE HCL (PF) 1 % IJ SOLN
INTRAMUSCULAR | Status: DC | PRN
  Administered 2020-06-23: 18 mL via INTRADERMAL

## 2020-06-23 MED ORDER — SODIUM CHLORIDE 0.9 % IV SOLN: INTRAVENOUS | Status: DC

## 2020-06-23 MED ORDER — FENTANYL CITRATE (PF) 100 MCG/2ML IJ SOLN
INTRAMUSCULAR | Status: AC
  Filled 2020-06-23: qty 2

## 2020-06-23 MED ORDER — HEPARIN (PORCINE) IN NACL 1000-0.9 UT/500ML-% IV SOLN
INTRAVENOUS | Status: AC
  Filled 2020-06-23: qty 1000

## 2020-06-23 MED ORDER — HEPARIN (PORCINE) IN NACL 1000-0.9 UT/500ML-% IV SOLN
INTRAVENOUS | Status: DC | PRN
  Administered 2020-06-23 (×2): 500 mL

## 2020-06-23 MED ORDER — SODIUM CHLORIDE 0.9% FLUSH: 3.0000 mL | Freq: Two times a day (BID) | INTRAVENOUS | Status: DC

## 2020-06-23 MED ORDER — HYDRALAZINE HCL 20 MG/ML IJ SOLN
5.0000 mg | INTRAMUSCULAR | Status: DC | PRN
Start: 2020-06-23 — End: 2020-06-23

## 2020-06-23 MED ORDER — IODIXANOL 320 MG/ML IV SOLN
INTRAVENOUS | Status: DC | PRN
  Administered 2020-06-23: 20 mL via INTRA_ARTERIAL

## 2020-06-23 MED ORDER — ONDANSETRON HCL 4 MG/2ML IJ SOLN: 4.0000 mg | Freq: Four times a day (QID) | INTRAMUSCULAR | Status: DC | PRN

## 2020-06-23 MED ORDER — LIDOCAINE HCL (PF) 1 % IJ SOLN
INTRAMUSCULAR | Status: AC
  Filled 2020-06-23: qty 30

## 2020-06-23 MED ORDER — SODIUM CHLORIDE 0.9 % IV SOLN: 250.0000 mL | INTRAVENOUS | Status: DC | PRN

## 2020-06-23 MED ORDER — MIDAZOLAM HCL 5 MG/5ML IJ SOLN
INTRAMUSCULAR | Status: AC
  Filled 2020-06-23: qty 5

## 2020-06-23 MED ORDER — SODIUM CHLORIDE 0.9% FLUSH: 3.0000 mL | INTRAVENOUS | Status: DC | PRN

## 2020-06-23 MED ORDER — FENTANYL CITRATE (PF) 100 MCG/2ML IJ SOLN
INTRAMUSCULAR | Status: DC | PRN
  Administered 2020-06-23: 25 ug via INTRAVENOUS

## 2020-06-23 MED ORDER — MIDAZOLAM HCL 2 MG/2ML IJ SOLN
INTRAMUSCULAR | Status: DC | PRN
  Administered 2020-06-23: 1 mg via INTRAVENOUS

## 2020-06-23 SURGICAL SUPPLY — 11 items
CATH CROSS OVER TEMPO 5F (CATHETERS) ×1 IMPLANT
CATH OMNI FLUSH 5F 65CM (CATHETERS) ×1 IMPLANT
DEVICE CLOSURE MYNXGRIP 5F (Vascular Products) ×1 IMPLANT
KIT MICROPUNCTURE NIT STIFF (SHEATH) ×1 IMPLANT
KIT PV (KITS) ×2 IMPLANT
SHEATH PINNACLE 5F 10CM (SHEATH) ×1 IMPLANT
SHEATH PROBE COVER 6X72 (BAG) ×1 IMPLANT
SYR MEDRAD MARK V 150ML (SYRINGE) ×1 IMPLANT
TRANSDUCER W/STOPCOCK (MISCELLANEOUS) ×2 IMPLANT
TRAY PV CATH (CUSTOM PROCEDURE TRAY) ×2 IMPLANT
WIRE STARTER BENTSON 035X150 (WIRE) ×1 IMPLANT

## 2020-06-23 NOTE — Discharge Instructions (Signed)
Femoral Site Care  This sheet gives you information about how to care for yourself after your procedure. Your health care provider may also give you more specific instructions. If you have problems or questions, contact your health care provider. What can I expect after the procedure? After the procedure, it is common to have:  Bruising that usually fades within 1-2 weeks.  Tenderness at the site. Follow these instructions at home: Wound care  Follow instructions from your health care provider about how to take care of your insertion site. Make sure you: ? Wash your hands with soap and water before you change your bandage (dressing). If soap and water are not available, use hand sanitizer. ? Change your dressing as told by your health care provider. ? Leave stitches (sutures), skin glue, or adhesive strips in place. These skin closures may need to stay in place for 2 weeks or longer. If adhesive strip edges start to loosen and curl up, you may trim the loose edges. Do not remove adhesive strips completely unless your health care provider tells you to do that.  Do not take baths, swim, or use a hot tub until your health care provider approves.  You may shower 24-48 hours after the procedure or as told by your health care provider. ? Gently wash the site with plain soap and water. ? Pat the area dry with a clean towel. ? Do not rub the site. This may cause bleeding.  Do not apply powder or lotion to the site. Keep the site clean and dry.  Check your femoral site every day for signs of infection. Check for: ? Redness, swelling, or pain. ? Fluid or blood. ? Warmth. ? Pus or a bad smell. Activity  For the first 2-3 days after your procedure, or as long as directed: ? Avoid climbing stairs as much as possible. ? Do not squat.  Do not lift anything that is heavier than 10 lb (4.5 kg), or the limit that you are told, until your health care provider says that it is safe.  Rest as  directed. ? Avoid sitting for a long time without moving. Get up to take short walks every 1-2 hours.  Do not drive for 24 hours if you were given a medicine to help you relax (sedative). General instructions  Take over-the-counter and prescription medicines only as told by your health care provider.  Keep all follow-up visits as told by your health care provider. This is important. Contact a health care provider if you have:  A fever or chills.  You have redness, swelling, or pain around your insertion site. Get help right away if:  The catheter insertion area swells very fast.  You pass out.  You suddenly start to sweat or your skin gets clammy.  The catheter insertion area is bleeding, and the bleeding does not stop when you hold steady pressure on the area.  The area near or just beyond the catheter insertion site becomes pale, cool, tingly, or numb. These symptoms may represent a serious problem that is an emergency. Do not wait to see if the symptoms will go away. Get medical help right away. Call your local emergency services (911 in the [place] Do not drive yourself to the hospital. Summary  After the procedure, it is common to have bruising that usually fades within 1-2 weeks.  Check your femoral site every day for signs of infection.  Do not lift anything that is heavier than 10 lb (4.5 kg), or   the limit that you are told, until your health care provider says that it is safe. This information is not intended to replace advice given to you by your health care provider. Make sure you discuss any questions you have with your health care provider. Document Revised: 12/11/2019 Document Reviewed: 12/11/2019 Elsevier Patient Education  2021 [employer]  

## 2020-06-23 NOTE — Progress Notes (Signed)
Daughter, [name] [name] at bedside with patient. Dr. Carlis Abbott and PA at bedside reviewing findings with daughter. Discharge instructions reviewed with patient and daughter. Verbalized understanding.

## 2020-06-23 NOTE — Op Note (Signed)
Patient name: AGEE SERAFINI MRN: [id number] DOB: 03-31-1946 Sex: male  06/23/2020 Pre-operative Diagnosis: Left lower extremity wounds Post-operative diagnosis:  Same Surgeon:  [name], MD Procedure Performed: 1.  Ultrasound-guided access of right common femoral artery 2.  Aortogram including catheter selection of aorta with CO2  3.  Left lower extremity arteriogram with selection of third order branches 4.  Mynx closure of the right common femoral artery  5.  41 minutes of monitored moderate conscious sedation time  Indications: Patient is a 75 year old male who was seen in consultation initially by Dr. Stanford Breed for evaluation of left lower extremity wounds.  He hss been in the wound clinic with Dr. Dellia Nims.  He presents for left leg arteriogram after risks and benefits discussed.  Contrast: 20 mL all other CO2  Findings:   Aortogram with CO2 showed a right renal artery that was widely patent but no left renal artery was visualized.  The aortoiliac segment was patent bilaterally with no flow-limiting stenosis.  On the left he had a widely patent common femoral with a diminutive profunda.  SFA was patent throughout its course with no flow-limiting stenosis as well as a patent above and below-knee popliteal artery.  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  The anterior tibial is occluded proximally with no stump.  Dominant runoff in the left lower extremities through the peroneal artery that is widely patent throughout its course with no stenosis and gives off a large collateral at the ankle to the anterior tibial  With flow into the dorsalis pedis.  We were able to visualize digital arteries filling in the foot as well.  Right lower extremity arteriogram with CO2 was limited but appeared to have flush SFA occlusion.   Procedure:  The patient was identified in the holding area and taken to room 8.  The patient was then placed supine on the table and prepped and draped in  the usual sterile fashion.  A time out was called.  Ultrasound was used to evaluate the right common femoral artery.  It was patent .  A digital ultrasound image was acquired.  A micropuncture needle was used to access the right common femoral artery under ultrasound guidance.  An 018 wire was advanced without resistance and a micropuncture sheath was placed.  The 018 wire was removed and a benson wire was placed.  The micropuncture sheath was exchanged for a 5 french sheath.  An omniflush catheter was advanced over the wire to the level of L-1.  An abdominal angiogram was obtained.  Next the catheter was pulled down and we attempted to do bilateral lower extremity runoff with CO2.  Given the patient has contractures it became difficult to see both legs at the same time.  We then selected the left iliac artery over the aortic bifurcation with an Omni Flush catheter and got our wire and catheter into the left SFA.  We performed hand injections down the left lower extremity with CO2 until this was washed out and then limited diluted contrast to visualize the tibial vessels.  He remained stable.  Wires and catheters were removed.  Mynx closure deployed in right common femoral artery.  Plan: Patient should continue wound clinic with Dr. Dellia Nims.  I feel like he is optimized from a arterial standpoint.  He has inline flow down the left lower extremity through a widely patent peroneal that gives off a large collateral at the ankle to the anterior tibial with flow into the  foot with a widely patent dorsalis pedis and filling of the digital arteries.  [name], MD [facility] Office: [phone]
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
## 2020-06-23 NOTE — Progress Notes (Signed)
[name] [name] Primary Care [name]: [name] [name] Other Clinician: Referring [name] [name]: Treating  [name] [name]/Extender: [name] [name] [name] [name] Weeks in Treatment: 218 Active Problems Location of Pain Severity and Description of Pain Patient Has Paino No Site Locations Pain Management and Medication Current Pain Management: Electronic Signature(s) Signed: 06/21/2020 5:53:33 PM By: [name] [name] Signed: 06/21/2020 5:58:27 PM By: [name] [name] Entered By: [name] [name] on 06/21/2020 11:28:29 -------------------------------------------------------------------------------- Patient/Caregiver Education Details Patient Name: Date of Service: [name] [name] 3/1/2022andnbsp11:00 A M Medical Record Number: [id number] Patient Account Number: [id number] Date of Birth/Gender: Treating [name]: 08/10/1945 (75 y.o. [name] Primary Care Physician: [name] [name] Other Clinician: Referring Physician: Treating Physician/Extender: [name] [name] [name] [name] Weeks in Treatment: 218 Education Assessment Education Provided To: Patient Education Topics Provided Basic Hygiene: Responses: Reinforcements needed, State content correctly Wound/Skin Impairment: Methods: Explain/Verbal Responses: Reinforcements needed, State content correctly Electronic Signature(s) Signed: 06/21/2020 5:58:27 PM By: [name] [name] Entered By: [name] [name] on 06/21/2020 12:25:09 -------------------------------------------------------------------------------- Wound Assessment Details Patient Name: Date of Service: [name] Foley F. 06/21/2020 11:00 A M Medical Record Number: [id number] Patient Account Number: [id number] Date of Birth/Sex: Treating [name]: 09-28-1945 (75 y.o. [name] [name] Primary Care [name] [name]: [name] [name] Other Clinician: Referring [name] [name]: Treating [name] [name] [name] [name] Weeks in Treatment: 218 Wound Status Wound Number: 14 Primary Diabetic Wound/Ulcer of the Lower Extremity Etiology: Wound Location: Left, Lateral Lower  Leg Wound Open Wounding Event: Gradually Appeared Status: Date Acquired: 08/15/2017 Comorbid Anemia, Sleep Apnea, Arrhythmia, Congestive Heart Failure, Weeks Of Treatment: 148 History: Coronary Artery Disease, Hypertension, Myocardial Infarction, Clustered Wound: Yes Type II Diabetes, Gout, Neuropathy Photos Wound Measurements Length: (cm) 10.3 Width: (cm) 0.5 Depth: (cm) 0.4 Clustered Quantity: 1 Area: (cm) 4.045 Volume: (cm) 1.618 % Reduction in Area: -329.4% % Reduction in Volume: -1621.3% Epithelialization: Small (1-33%) Tunneling: No Undermining: No Wound Description Classification: Grade 2 Wound Margin: Flat and Intact Exudate Amount: Large Exudate Type: Serosanguineous Exudate Color: red, brown Foul Odor After Cleansing: No Slough/Fibrino Yes Wound Bed Granulation Amount: Medium (34-66%) Exposed Structure Granulation Quality: Red Fascia Exposed: No Necrotic Amount: Medium (34-66%) Fat Layer (Subcutaneous Tissue) Exposed: Yes Necrotic Quality: Eschar, Adherent Slough Tendon Exposed: No Muscle Exposed: No Joint Exposed: No Bone Exposed: No Treatment Notes Wound #14 (Lower Leg) Wound Laterality: Left, Lateral Cleanser Wound Cleanser Discharge Instruction: Cleanse the wound with wound cleanser prior to applying a clean dressing using gauze sponges, not tissue or cotton balls. Soap and Water Discharge Instruction: May shower and wash wound with dial antibacterial soap and water prior to dressing change. Peri-Wound Care Sween Lotion (Moisturizing lotion) Discharge Instruction: Apply moisturizing lotion as directed Topical Primary Dressing KerraCel Ag Gelling Fiber Dressing, 4x5 in (silver alginate) Discharge Instruction: Apply silver alginate to wound bed as instructed Secondary Dressing Woven Gauze Sponge, Non-Sterile 4x4 in Discharge Instruction: Apply over primary dressing as directed. ABD Pad, 5x9 Discharge Instruction: Apply over primary dressing as  directed. Secured With Compression Wrap Kerlix Roll 4.5x3.1 (in/yd) Discharge Instruction: Apply Kerlix and Coban compression as directed. Coban Self-Adherent Wrap 4x5 (in/yd) Discharge Instruction: Apply over Kerlix as directed. Compression Stockings Add-Ons Electronic Signature(s) Signed: 06/21/2020 5:53:33 PM By: [name] [name] Signed: 06/23/2020 5:49:32 PM By: [name] [name] Previous Signature: 06/21/2020 1:49:11 PM Version By: [name] [name] Entered By: [name] [name] on 06/21/2020 17:25:13 -------------------------------------------------------------------------------- Wound Assessment Details Patient Name: Date of Service: [name] Foley F. 06/21/2020 11:00 A M Medical Record Number: [id number] Patient Account Number: [id number] Date of Birth/Sex: Treating [name]:  [name] [name], [name] [name] ([id number]) Visit Report for 06/21/2020 Arrival Information Details Patient Name: Date of Service: [name] [facility] F. 06/21/2020 11:00 A M Medical Record Number: [id number] Patient Account Number: [id number] Date of Birth/Sex: Treating [name]: [birth year] (75 y.o. [name] [name] Primary Care [name] [name]: [name] [name] Other Clinician: Referring [name] [name]: Treating [name] [name] [name] [name] Weeks in Treatment: 218 Visit Information History Since Last Visit Added or deleted any medications: No Patient Arrived: Wheel Chair Any new allergies or adverse reactions: No Arrival Time: 11:27 Had a fall or experienced change in No Accompanied By: daughter activities of daily living that may affect Transfer Assistance: Manual risk of falls: Patient Identification Verified: Yes Signs or symptoms of abuse/neglect since last visito No Secondary Verification Process Completed: Yes Hospitalized since last visit: No Patient Requires Transmission-Based Precautions: No Implantable device outside of the clinic excluding No Patient Has Alerts: Yes cellular tissue based products placed in the center Patient Alerts: Patient on Blood Thinner since last visit: left ABI 1.0; rt ABI 1.09 Has Dressing in Place as Prescribed: Yes Pain Present Now: No Electronic Signature(s) Signed: 06/21/2020 5:53:33 PM By: [name] [name] Entered By: [name] [name] on 06/21/2020 11:27:49 -------------------------------------------------------------------------------- Clinic Level of Care Assessment Details Patient Name: Date of Service: [name] [facility] F. 06/21/2020 11:00 A M Medical Record Number: [id number] Patient Account Number: [id number] Date of Birth/Sex: Treating [name]: 1946/03/26 (75 y.o. [name] Pop, [place] Primary Care Chandelle Harkey: [name] [name] Other Clinician: Referring Afsheen Antony: Treating Bryler Dibble/Extender: [name] [name] [name] [name] Weeks in Treatment: 218 Clinic  Level of Care Assessment Items TOOL 4 Quantity Score X- 1 0 Use when only an EandM is performed on FOLLOW-UP visit ASSESSMENTS - Nursing Assessment / Reassessment X- 1 10 Reassessment of Co-morbidities (includes updates in patient status) X- 1 5 Reassessment of Adherence to Treatment Plan ASSESSMENTS - Wound and Skin A ssessment / Reassessment '[]'$  - 0 Simple Wound Assessment / Reassessment - one wound X- 9 5 Complex Wound Assessment / Reassessment - multiple wounds '[]'$  - 0 Dermatologic / Skin Assessment (not related to wound area) ASSESSMENTS - Focused Assessment X- 1 5 Circumferential Edema Measurements - multi extremities X- 1 10 Nutritional Assessment / Counseling / Intervention X- 1 5 Lower Extremity Assessment (monofilament, tuning fork, pulses) '[]'$  - 0 Peripheral Arterial Disease Assessment (using hand held doppler) ASSESSMENTS - Ostomy and/or Continence Assessment and Care '[]'$  - 0 Incontinence Assessment and Management '[]'$  - 0 Ostomy Care Assessment and Management (repouching, etc.) PROCESS - Coordination of Care '[]'$  - 0 Simple Patient / Family Education for ongoing care X- 1 20 Complex (extensive) Patient / Family Education for ongoing care X- 1 10 Staff obtains Programmer, systems, Records, T Results / Process Orders est '[]'$  - 0 Staff telephones HHA, Nursing Homes / Clarify orders / etc '[]'$  - 0 Routine Transfer to another Facility (non-emergent condition) '[]'$  - 0 Routine Hospital Admission (non-emergent condition) '[]'$  - 0 New Admissions / Biomedical engineer / Ordering NPWT Apligraf, etc. , '[]'$  - 0 Emergency Hospital Admission (emergent condition) '[]'$  - 0 Simple Discharge Coordination X- 1 15 Complex (extensive) Discharge Coordination PROCESS - Special Needs '[]'$  - 0 Pediatric / Minor Patient Management '[]'$  - 0 Isolation Patient Management '[]'$  - 0 Hearing / Language / Visual special needs '[]'$  - 0 Assessment of Community assistance (transportation, D/C planning,  etc.) '[]'$  - 0 Additional assistance / Altered mentation '[]'$  - 0 Support Surface(s) Assessment (bed, cushion, seat, etc.) INTERVENTIONS - Wound Cleansing / Measurement '[]'$  - 0 Simple Wound Cleansing -  [name] [name], [name] [name] ([id number]) Visit Report for 06/21/2020 Arrival Information Details Patient Name: Date of Service: [name] [facility] F. 06/21/2020 11:00 A M Medical Record Number: [id number] Patient Account Number: [id number] Date of Birth/Sex: Treating [name]: [birth year] (75 y.o. [name] [name] Primary Care [name] [name]: [name] [name] Other Clinician: Referring [name] [name]: Treating [name] [name] [name] [name] Weeks in Treatment: 218 Visit Information History Since Last Visit Added or deleted any medications: No Patient Arrived: Wheel Chair Any new allergies or adverse reactions: No Arrival Time: 11:27 Had a fall or experienced change in No Accompanied By: daughter activities of daily living that may affect Transfer Assistance: Manual risk of falls: Patient Identification Verified: Yes Signs or symptoms of abuse/neglect since last visito No Secondary Verification Process Completed: Yes Hospitalized since last visit: No Patient Requires Transmission-Based Precautions: No Implantable device outside of the clinic excluding No Patient Has Alerts: Yes cellular tissue based products placed in the center Patient Alerts: Patient on Blood Thinner since last visit: left ABI 1.0; rt ABI 1.09 Has Dressing in Place as Prescribed: Yes Pain Present Now: No Electronic Signature(s) Signed: 06/21/2020 5:53:33 PM By: [name] [name] Entered By: [name] [name] on 06/21/2020 11:27:49 -------------------------------------------------------------------------------- Clinic Level of Care Assessment Details Patient Name: Date of Service: [name] [facility] F. 06/21/2020 11:00 A M Medical Record Number: [id number] Patient Account Number: [id number] Date of Birth/Sex: Treating [name]: 1946/03/26 (75 y.o. [name] Pop, [place] Primary Care Chandelle Harkey: [name] [name] Other Clinician: Referring Afsheen Antony: Treating Bryler Dibble/Extender: [name] [name] [name] [name] Weeks in Treatment: 218 Clinic  Level of Care Assessment Items TOOL 4 Quantity Score X- 1 0 Use when only an EandM is performed on FOLLOW-UP visit ASSESSMENTS - Nursing Assessment / Reassessment X- 1 10 Reassessment of Co-morbidities (includes updates in patient status) X- 1 5 Reassessment of Adherence to Treatment Plan ASSESSMENTS - Wound and Skin A ssessment / Reassessment '[]'$  - 0 Simple Wound Assessment / Reassessment - one wound X- 9 5 Complex Wound Assessment / Reassessment - multiple wounds '[]'$  - 0 Dermatologic / Skin Assessment (not related to wound area) ASSESSMENTS - Focused Assessment X- 1 5 Circumferential Edema Measurements - multi extremities X- 1 10 Nutritional Assessment / Counseling / Intervention X- 1 5 Lower Extremity Assessment (monofilament, tuning fork, pulses) '[]'$  - 0 Peripheral Arterial Disease Assessment (using hand held doppler) ASSESSMENTS - Ostomy and/or Continence Assessment and Care '[]'$  - 0 Incontinence Assessment and Management '[]'$  - 0 Ostomy Care Assessment and Management (repouching, etc.) PROCESS - Coordination of Care '[]'$  - 0 Simple Patient / Family Education for ongoing care X- 1 20 Complex (extensive) Patient / Family Education for ongoing care X- 1 10 Staff obtains Programmer, systems, Records, T Results / Process Orders est '[]'$  - 0 Staff telephones HHA, Nursing Homes / Clarify orders / etc '[]'$  - 0 Routine Transfer to another Facility (non-emergent condition) '[]'$  - 0 Routine Hospital Admission (non-emergent condition) '[]'$  - 0 New Admissions / Biomedical engineer / Ordering NPWT Apligraf, etc. , '[]'$  - 0 Emergency Hospital Admission (emergent condition) '[]'$  - 0 Simple Discharge Coordination X- 1 15 Complex (extensive) Discharge Coordination PROCESS - Special Needs '[]'$  - 0 Pediatric / Minor Patient Management '[]'$  - 0 Isolation Patient Management '[]'$  - 0 Hearing / Language / Visual special needs '[]'$  - 0 Assessment of Community assistance (transportation, D/C planning,  etc.) '[]'$  - 0 Additional assistance / Altered mentation '[]'$  - 0 Support Surface(s) Assessment (bed, cushion, seat, etc.) INTERVENTIONS - Wound Cleansing / Measurement '[]'$  - 0 Simple Wound Cleansing -  wound with wound cleanser prior to applying a clean dressing using gauze sponges, not tissue or cotton balls. Soap and Water Discharge Instruction: May shower and wash wound with dial antibacterial soap and water prior to dressing change. Peri-Wound Care Sween Lotion (Moisturizing lotion) Discharge Instruction: Apply moisturizing lotion as directed Topical Primary Dressing KerraCel Ag Gelling Fiber Dressing, 4x5 in (silver  alginate) Discharge Instruction: Apply silver alginate to wound bed as instructed Secondary Dressing Woven Gauze Sponge, Non-Sterile 4x4 in Discharge Instruction: Apply over primary dressing as directed. ABD Pad, 5x9 Discharge Instruction: Apply over primary dressing as directed. Secured With Compression Wrap Kerlix Roll 4.5x3.1 (in/yd) Discharge Instruction: Apply Kerlix and Coban compression as directed. Coban Self-Adherent Wrap 4x5 (in/yd) Discharge Instruction: Apply over Kerlix as directed. Compression Stockings Add-Ons Electronic Signature(s) Signed: 06/21/2020 5:53:33 PM By: [name] [name] Signed: 06/23/2020 5:49:32 PM By: [name] [name] Previous Signature: 06/21/2020 1:49:11 PM Version By: [name] [name] Entered By: [name] [name] on 06/21/2020 17:26:55 -------------------------------------------------------------------------------- Wound Assessment Details Patient Name: Date of Service: [name] Foley F. 06/21/2020 11:00 A M Medical Record Number: [id number] Patient Account Number: [id number] Date of Birth/Sex: Treating [name]: July 08, 1945 (75 y.o. [name] [name] Primary Care Kemper Hochman: [name] [name] Other Clinician: Referring Ahrianna Siglin: Treating Stefania Goulart/Extender: [name] [name] [name] [name] Weeks in Treatment: 218 Wound Status Wound Number: 28 Primary Diabetic Wound/Ulcer of the Lower Extremity Etiology: Wound Location: Right, Dorsal Foot Wound Open Wounding Event: Gradually Appeared Status: Date Acquired: 04/26/2020 Comorbid Anemia, Sleep Apnea, Arrhythmia, Congestive Heart Failure, Weeks Of Treatment: 8 History: Coronary Artery Disease, Hypertension, Myocardial Infarction, Clustered Wound: Yes Type II Diabetes, Gout, Neuropathy Photos Wound Measurements Length: (cm) 0.3 Width: (cm) 0.3 Depth: (cm) 0.1 Clustered Quantity: 2 Area: (cm) 0.071 Volume: (cm) 0.007 % Reduction in Area: 93.2% % Reduction in Volume: 93.3% Epithelialization: Small  (1-33%) Tunneling: No Undermining: No Wound Description Classification: Grade 1 Wound Margin: Flat and Intact Exudate Amount: Small Exudate Type: Serosanguineous Exudate Color: red, brown Foul Odor After Cleansing: No Slough/Fibrino Yes Wound Bed Granulation Amount: Small (1-33%) Exposed Structure Granulation Quality: Pink Fascia Exposed: No Necrotic Amount: Large (67-100%) Fat Layer (Subcutaneous Tissue) Exposed: Yes Necrotic Quality: Adherent Slough Tendon Exposed: No Muscle Exposed: No Joint Exposed: No Bone Exposed: No Treatment Notes Wound #28 (Foot) Wound Laterality: Dorsal, Right Cleanser Wound Cleanser Discharge Instruction: Cleanse the wound with wound cleanser prior to applying a clean dressing using gauze sponges, not tissue or cotton balls. Soap and Water Discharge Instruction: May shower and wash wound with dial antibacterial soap and water prior to dressing change. Peri-Wound Care Sween Lotion (Moisturizing lotion) Discharge Instruction: Apply moisturizing lotion as directed Topical Primary Dressing KerraCel Ag Gelling Fiber Dressing, 4x5 in (silver alginate) Discharge Instruction: Apply silver alginate to wound bed as instructed Secondary Dressing Woven Gauze Sponge, Non-Sterile 4x4 in Discharge Instruction: Apply over primary dressing as directed. ABD Pad, 5x9 Discharge Instruction: Apply over primary dressing as directed. Secured With Compression Wrap Kerlix Roll 4.5x3.1 (in/yd) Discharge Instruction: Apply Kerlix and Coban compression as directed. Coban Self-Adherent Wrap 4x5 (in/yd) Discharge Instruction: Apply over Kerlix as directed. Compression Stockings Add-Ons Electronic Signature(s) Signed: 06/21/2020 5:53:33 PM By: [name] [name] Signed: 06/23/2020 5:49:32 PM By: [name] [name] Previous Signature: 06/21/2020 1:49:11 PM Version By: [name] [name] Entered By: [name] [name] on 06/21/2020  17:23:32 -------------------------------------------------------------------------------- Wound Assessment Details Patient Name: Date of Service: [name] Foley F. 06/21/2020 11:00 A M Medical Record Number: [id number] Patient Account Number: [id number] Date of Birth/Sex: Treating [name]: 1945/07/26 (75 y.o. M) Rolin Barry, Tammi Klippel  Treatment: 1 History: Coronary Artery Disease, Hypertension, Myocardial Infarction, Clustered Wound: No Type II Diabetes, Gout, Neuropathy Photos Wound Measurements Length: (cm) 0.7 Width: (cm) 0.5 Depth: (cm) 0.9 Area: (cm) 0.275 Volume: (cm) 0.247 % Reduction in Area: 41.6% % Reduction in Volume: -4.7% Epithelialization: Small (1-33%) Tunneling: No Undermining: No Wound Description Classification: Grade 2 Wound Margin: Well defined, not attached Exudate Amount: Medium Exudate Type: Purulent Exudate Color: yellow, brown, green Foul Odor After Cleansing: No Slough/Fibrino Yes Wound Bed Granulation Amount: Medium (34-66%) Exposed Structure Granulation Quality: Red, Hyper-granulation, Friable Fascia Exposed: No Necrotic Amount: Medium (34-66%) Fat Layer (Subcutaneous Tissue) Exposed: Yes Necrotic Quality: Adherent Slough Tendon Exposed: No Muscle Exposed: No Joint Exposed: No Bone Exposed: Yes Treatment Notes Wound #35 (Foot) Wound Laterality: Dorsal, Left, Lateral Cleanser Wound Cleanser Discharge Instruction: Cleanse the wound with wound cleanser prior to applying a clean dressing using gauze sponges, not tissue or cotton balls. Soap and Water Discharge Instruction: May shower and wash wound with dial antibacterial soap and water prior to dressing change. Peri-Wound Care Sween Lotion (Moisturizing lotion) Discharge Instruction: Apply  moisturizing lotion as directed Topical Primary Dressing KerraCel Ag Gelling Fiber Dressing, 2x2 in (silver alginate) Discharge Instruction: Apply silver alginate to wound bed as instructed Secondary Dressing Woven Gauze Sponge, Non-Sterile 4x4 in Discharge Instruction: Apply over primary dressing as directed. ABD Pad, 5x9 Discharge Instruction: Apply over primary dressing as directed. Secured With Compression Wrap Kerlix Roll 4.5x3.1 (in/yd) Discharge Instruction: Apply Kerlix and Coban compression as directed. Coban Self-Adherent Wrap 4x5 (in/yd) Discharge Instruction: Apply over Kerlix as directed. Compression Stockings Add-Ons Electronic Signature(s) Signed: 06/21/2020 5:53:33 PM By: [name] [name] Signed: 06/23/2020 5:49:32 PM By: [name] [name] Previous Signature: 06/21/2020 1:49:11 PM Version By: [name] [name] Entered By: [name] [name] on 06/21/2020 17:26:17 -------------------------------------------------------------------------------- Vitals Details Patient Name: Date of Service: [name] Foley F. 06/21/2020 11:00 A M Medical Record Number: [id number] Patient Account Number: [id number] Date of Birth/Sex: Treating [name]: 08-29-1945 (75 y.o. [name] [name] Primary Care Raoul Ciano: [name] [name] Other Clinician: Referring Noretta Frier: Treating Tayte Mcwherter/Extender: [name] [name] [name] [name] Weeks in Treatment: 218 Vital Signs Time Taken: 11:27 Temperature (F): 98.4 Height (in): 74 Pulse (bpm): 76 Weight (lbs): 150 Respiratory Rate (breaths/min): 18 Body Mass Index (BMI): 19.3 Blood Pressure (mmHg): 113/66 Capillary Blood Glucose (mg/dl): 123 Reference Range: 80 - 120 mg / dl Electronic Signature(s) Signed: 06/21/2020 5:53:33 PM By: [name] [name] Entered By: [name] [name] on 06/21/2020 11:28:15  Treatment: 1 History: Coronary Artery Disease, Hypertension, Myocardial Infarction, Clustered Wound: No Type II Diabetes, Gout, Neuropathy Photos Wound Measurements Length: (cm) 0.7 Width: (cm) 0.5 Depth: (cm) 0.9 Area: (cm) 0.275 Volume: (cm) 0.247 % Reduction in Area: 41.6% % Reduction in Volume: -4.7% Epithelialization: Small (1-33%) Tunneling: No Undermining: No Wound Description Classification: Grade 2 Wound Margin: Well defined, not attached Exudate Amount: Medium Exudate Type: Purulent Exudate Color: yellow, brown, green Foul Odor After Cleansing: No Slough/Fibrino Yes Wound Bed Granulation Amount: Medium (34-66%) Exposed Structure Granulation Quality: Red, Hyper-granulation, Friable Fascia Exposed: No Necrotic Amount: Medium (34-66%) Fat Layer (Subcutaneous Tissue) Exposed: Yes Necrotic Quality: Adherent Slough Tendon Exposed: No Muscle Exposed: No Joint Exposed: No Bone Exposed: Yes Treatment Notes Wound #35 (Foot) Wound Laterality: Dorsal, Left, Lateral Cleanser Wound Cleanser Discharge Instruction: Cleanse the wound with wound cleanser prior to applying a clean dressing using gauze sponges, not tissue or cotton balls. Soap and Water Discharge Instruction: May shower and wash wound with dial antibacterial soap and water prior to dressing change. Peri-Wound Care Sween Lotion (Moisturizing lotion) Discharge Instruction: Apply  moisturizing lotion as directed Topical Primary Dressing KerraCel Ag Gelling Fiber Dressing, 2x2 in (silver alginate) Discharge Instruction: Apply silver alginate to wound bed as instructed Secondary Dressing Woven Gauze Sponge, Non-Sterile 4x4 in Discharge Instruction: Apply over primary dressing as directed. ABD Pad, 5x9 Discharge Instruction: Apply over primary dressing as directed. Secured With Compression Wrap Kerlix Roll 4.5x3.1 (in/yd) Discharge Instruction: Apply Kerlix and Coban compression as directed. Coban Self-Adherent Wrap 4x5 (in/yd) Discharge Instruction: Apply over Kerlix as directed. Compression Stockings Add-Ons Electronic Signature(s) Signed: 06/21/2020 5:53:33 PM By: [name] [name] Signed: 06/23/2020 5:49:32 PM By: [name] [name] Previous Signature: 06/21/2020 1:49:11 PM Version By: [name] [name] Entered By: [name] [name] on 06/21/2020 17:26:17 -------------------------------------------------------------------------------- Vitals Details Patient Name: Date of Service: [name] Foley F. 06/21/2020 11:00 A M Medical Record Number: [id number] Patient Account Number: [id number] Date of Birth/Sex: Treating [name]: 08-29-1945 (75 y.o. [name] [name] Primary Care Raoul Ciano: [name] [name] Other Clinician: Referring Noretta Frier: Treating Tayte Mcwherter/Extender: [name] [name] [name] [name] Weeks in Treatment: 218 Vital Signs Time Taken: 11:27 Temperature (F): 98.4 Height (in): 74 Pulse (bpm): 76 Weight (lbs): 150 Respiratory Rate (breaths/min): 18 Body Mass Index (BMI): 19.3 Blood Pressure (mmHg): 113/66 Capillary Blood Glucose (mg/dl): 123 Reference Range: 80 - 120 mg / dl Electronic Signature(s) Signed: 06/21/2020 5:53:33 PM By: [name] [name] Entered By: [name] [name] on 06/21/2020 11:28:15  Treatment: 1 History: Coronary Artery Disease, Hypertension, Myocardial Infarction, Clustered Wound: No Type II Diabetes, Gout, Neuropathy Photos Wound Measurements Length: (cm) 0.7 Width: (cm) 0.5 Depth: (cm) 0.9 Area: (cm) 0.275 Volume: (cm) 0.247 % Reduction in Area: 41.6% % Reduction in Volume: -4.7% Epithelialization: Small (1-33%) Tunneling: No Undermining: No Wound Description Classification: Grade 2 Wound Margin: Well defined, not attached Exudate Amount: Medium Exudate Type: Purulent Exudate Color: yellow, brown, green Foul Odor After Cleansing: No Slough/Fibrino Yes Wound Bed Granulation Amount: Medium (34-66%) Exposed Structure Granulation Quality: Red, Hyper-granulation, Friable Fascia Exposed: No Necrotic Amount: Medium (34-66%) Fat Layer (Subcutaneous Tissue) Exposed: Yes Necrotic Quality: Adherent Slough Tendon Exposed: No Muscle Exposed: No Joint Exposed: No Bone Exposed: Yes Treatment Notes Wound #35 (Foot) Wound Laterality: Dorsal, Left, Lateral Cleanser Wound Cleanser Discharge Instruction: Cleanse the wound with wound cleanser prior to applying a clean dressing using gauze sponges, not tissue or cotton balls. Soap and Water Discharge Instruction: May shower and wash wound with dial antibacterial soap and water prior to dressing change. Peri-Wound Care Sween Lotion (Moisturizing lotion) Discharge Instruction: Apply  moisturizing lotion as directed Topical Primary Dressing KerraCel Ag Gelling Fiber Dressing, 2x2 in (silver alginate) Discharge Instruction: Apply silver alginate to wound bed as instructed Secondary Dressing Woven Gauze Sponge, Non-Sterile 4x4 in Discharge Instruction: Apply over primary dressing as directed. ABD Pad, 5x9 Discharge Instruction: Apply over primary dressing as directed. Secured With Compression Wrap Kerlix Roll 4.5x3.1 (in/yd) Discharge Instruction: Apply Kerlix and Coban compression as directed. Coban Self-Adherent Wrap 4x5 (in/yd) Discharge Instruction: Apply over Kerlix as directed. Compression Stockings Add-Ons Electronic Signature(s) Signed: 06/21/2020 5:53:33 PM By: [name] [name] Signed: 06/23/2020 5:49:32 PM By: [name] [name] Previous Signature: 06/21/2020 1:49:11 PM Version By: [name] [name] Entered By: [name] [name] on 06/21/2020 17:26:17 -------------------------------------------------------------------------------- Vitals Details Patient Name: Date of Service: [name] Foley F. 06/21/2020 11:00 A M Medical Record Number: [id number] Patient Account Number: [id number] Date of Birth/Sex: Treating [name]: 08-29-1945 (75 y.o. [name] [name] Primary Care Raoul Ciano: [name] [name] Other Clinician: Referring Noretta Frier: Treating Tayte Mcwherter/Extender: [name] [name] [name] [name] Weeks in Treatment: 218 Vital Signs Time Taken: 11:27 Temperature (F): 98.4 Height (in): 74 Pulse (bpm): 76 Weight (lbs): 150 Respiratory Rate (breaths/min): 18 Body Mass Index (BMI): 19.3 Blood Pressure (mmHg): 113/66 Capillary Blood Glucose (mg/dl): 123 Reference Range: 80 - 120 mg / dl Electronic Signature(s) Signed: 06/21/2020 5:53:33 PM By: [name] [name] Entered By: [name] [name] on 06/21/2020 11:28:15  [name] [name] Primary Care [name]: [name] [name] Other Clinician: Referring [name] [name]: Treating  [name] [name]/Extender: [name] [name] [name] [name] Weeks in Treatment: 218 Active Problems Location of Pain Severity and Description of Pain Patient Has Paino No Site Locations Pain Management and Medication Current Pain Management: Electronic Signature(s) Signed: 06/21/2020 5:53:33 PM By: [name] [name] Signed: 06/21/2020 5:58:27 PM By: [name] [name] Entered By: [name] [name] on 06/21/2020 11:28:29 -------------------------------------------------------------------------------- Patient/Caregiver Education Details Patient Name: Date of Service: [name] [name] 3/1/2022andnbsp11:00 A M Medical Record Number: [id number] Patient Account Number: [id number] Date of Birth/Gender: Treating [name]: 08/10/1945 (75 y.o. [name] Primary Care Physician: [name] [name] Other Clinician: Referring Physician: Treating Physician/Extender: [name] [name] [name] [name] Weeks in Treatment: 218 Education Assessment Education Provided To: Patient Education Topics Provided Basic Hygiene: Responses: Reinforcements needed, State content correctly Wound/Skin Impairment: Methods: Explain/Verbal Responses: Reinforcements needed, State content correctly Electronic Signature(s) Signed: 06/21/2020 5:58:27 PM By: [name] [name] Entered By: [name] [name] on 06/21/2020 12:25:09 -------------------------------------------------------------------------------- Wound Assessment Details Patient Name: Date of Service: [name] Foley F. 06/21/2020 11:00 A M Medical Record Number: [id number] Patient Account Number: [id number] Date of Birth/Sex: Treating [name]: 09-28-1945 (75 y.o. [name] [name] Primary Care [name] [name]: [name] [name] Other Clinician: Referring [name] [name]: Treating [name] [name] [name] [name] Weeks in Treatment: 218 Wound Status Wound Number: 14 Primary Diabetic Wound/Ulcer of the Lower Extremity Etiology: Wound Location: Left, Lateral Lower  Leg Wound Open Wounding Event: Gradually Appeared Status: Date Acquired: 08/15/2017 Comorbid Anemia, Sleep Apnea, Arrhythmia, Congestive Heart Failure, Weeks Of Treatment: 148 History: Coronary Artery Disease, Hypertension, Myocardial Infarction, Clustered Wound: Yes Type II Diabetes, Gout, Neuropathy Photos Wound Measurements Length: (cm) 10.3 Width: (cm) 0.5 Depth: (cm) 0.4 Clustered Quantity: 1 Area: (cm) 4.045 Volume: (cm) 1.618 % Reduction in Area: -329.4% % Reduction in Volume: -1621.3% Epithelialization: Small (1-33%) Tunneling: No Undermining: No Wound Description Classification: Grade 2 Wound Margin: Flat and Intact Exudate Amount: Large Exudate Type: Serosanguineous Exudate Color: red, brown Foul Odor After Cleansing: No Slough/Fibrino Yes Wound Bed Granulation Amount: Medium (34-66%) Exposed Structure Granulation Quality: Red Fascia Exposed: No Necrotic Amount: Medium (34-66%) Fat Layer (Subcutaneous Tissue) Exposed: Yes Necrotic Quality: Eschar, Adherent Slough Tendon Exposed: No Muscle Exposed: No Joint Exposed: No Bone Exposed: No Treatment Notes Wound #14 (Lower Leg) Wound Laterality: Left, Lateral Cleanser Wound Cleanser Discharge Instruction: Cleanse the wound with wound cleanser prior to applying a clean dressing using gauze sponges, not tissue or cotton balls. Soap and Water Discharge Instruction: May shower and wash wound with dial antibacterial soap and water prior to dressing change. Peri-Wound Care Sween Lotion (Moisturizing lotion) Discharge Instruction: Apply moisturizing lotion as directed Topical Primary Dressing KerraCel Ag Gelling Fiber Dressing, 4x5 in (silver alginate) Discharge Instruction: Apply silver alginate to wound bed as instructed Secondary Dressing Woven Gauze Sponge, Non-Sterile 4x4 in Discharge Instruction: Apply over primary dressing as directed. ABD Pad, 5x9 Discharge Instruction: Apply over primary dressing as  directed. Secured With Compression Wrap Kerlix Roll 4.5x3.1 (in/yd) Discharge Instruction: Apply Kerlix and Coban compression as directed. Coban Self-Adherent Wrap 4x5 (in/yd) Discharge Instruction: Apply over Kerlix as directed. Compression Stockings Add-Ons Electronic Signature(s) Signed: 06/21/2020 5:53:33 PM By: [name] [name] Signed: 06/23/2020 5:49:32 PM By: [name] [name] Previous Signature: 06/21/2020 1:49:11 PM Version By: [name] [name] Entered By: [name] [name] on 06/21/2020 17:25:13 -------------------------------------------------------------------------------- Wound Assessment Details Patient Name: Date of Service: [name] Foley F. 06/21/2020 11:00 A M Medical Record Number: [id number] Patient Account Number: [id number] Date of Birth/Sex: Treating [name]:  Date Acquired: 05/03/2020 Comorbid Anemia, Sleep Apnea, Arrhythmia, Congestive Heart Failure, Weeks Of Treatment: 7 History: Coronary Artery Disease, Hypertension, Myocardial Infarction, Clustered Wound: No Type II Diabetes, Gout, Neuropathy Photos Wound Measurements Length: (cm) 1.4 Width: (cm) 1 Depth: (cm) 0.2 Area: (cm) 1.1 Volume: (cm) 0.22 % Reduction in Area: 36.6% % Reduction in Volume: -26.4% Epithelialization: Small (1-33%) Tunneling: No Undermining: No Wound Description Classification: Grade 2 Wound Margin: Flat and Intact Exudate Amount: Medium Exudate Type: Serosanguineous Exudate Color: red, brown Foul Odor After Cleansing: No Slough/Fibrino Yes Wound Bed Granulation Amount: Small (1-33%) Exposed Structure Granulation Quality: Pink Fascia Exposed: No Necrotic Amount: Large (67-100%) Fat Layer  (Subcutaneous Tissue) Exposed: Yes Necrotic Quality: Adherent Slough Tendon Exposed: No Muscle Exposed: No Joint Exposed: No Bone Exposed: No Treatment Notes Wound #32 (Foot) Wound Laterality: Dorsal, Left, Distal Cleanser Wound Cleanser Discharge Instruction: Cleanse the wound with wound cleanser prior to applying a clean dressing using gauze sponges, not tissue or cotton balls. Soap and Water Discharge Instruction: May shower and wash wound with dial antibacterial soap and water prior to dressing change. Peri-Wound Care Sween Lotion (Moisturizing lotion) Discharge Instruction: Apply moisturizing lotion as directed Topical Primary Dressing KerraCel Ag Gelling Fiber Dressing, 2x2 in (silver alginate) Discharge Instruction: Apply silver alginate to wound bed as instructed Secondary Dressing Woven Gauze Sponge, Non-Sterile 4x4 in Discharge Instruction: Apply over primary dressing as directed. ABD Pad, 5x9 Discharge Instruction: Apply over primary dressing as directed. Secured With Compression Wrap Kerlix Roll 4.5x3.1 (in/yd) Discharge Instruction: Apply Kerlix and Coban compression as directed. Coban Self-Adherent Wrap 4x5 (in/yd) Discharge Instruction: Apply over Kerlix as directed. Compression Stockings Add-Ons Electronic Signature(s) Signed: 06/21/2020 5:53:33 PM By: [name] [name] Signed: 06/23/2020 5:49:32 PM By: [name] [name] Previous Signature: 06/21/2020 1:49:11 PM Version By: [name] [name] Entered By: [name] [name] on 06/21/2020 17:25:53 -------------------------------------------------------------------------------- Wound Assessment Details Patient Name: Date of Service: [name] Foley F. 06/21/2020 11:00 A M Medical Record Number: [id number] Patient Account Number: [id number] Date of Birth/Sex: Treating [name]: October 03, 1945 (75 y.o. [name] [name] Primary Care Hala Narula: [name] [name] Other Clinician: Referring Solyana Nonaka: Treating Rayli Wiederhold/Extender: [name],  [name] [name] [name] Weeks in Treatment: 218 Wound Status Wound Number: 33 Primary Diabetic Wound/Ulcer of the Lower Extremity Etiology: Wound Location: Right, Distal, Dorsal Foot Wound Open Wounding Event: Gradually Appeared Status: Date Acquired: 05/17/2020 Comorbid Anemia, Sleep Apnea, Arrhythmia, Congestive Heart Failure, Weeks Of Treatment: 5 History: Coronary Artery Disease, Hypertension, Myocardial Infarction, Clustered Wound: No Type II Diabetes, Gout, Neuropathy Photos Wound Measurements Length: (cm) 0.3 Width: (cm) 0.4 Depth: (cm) 0.1 Area: (cm) 0.094 Volume: (cm) 0.009 % Reduction in Area: 25.4% % Reduction in Volume: 30.8% Epithelialization: Small (1-33%) Tunneling: No Undermining: No Wound Description Classification: Grade 1 Wound Margin: Flat and Intact Exudate Amount: Small Exudate Type: Serosanguineous Exudate Color: red, brown Foul Odor After Cleansing: No Slough/Fibrino Yes Wound Bed Granulation Amount: None Present (0%) Exposed Structure Necrotic Amount: Large (67-100%) Fascia Exposed: No Necrotic Quality: Adherent Slough Fat Layer (Subcutaneous Tissue) Exposed: Yes Tendon Exposed: No Muscle Exposed: No Joint Exposed: No Bone Exposed: No Treatment Notes Wound #33 (Foot) Wound Laterality: Dorsal, Right, Distal Cleanser Wound Cleanser Discharge Instruction: Cleanse the wound with wound cleanser prior to applying a clean dressing using gauze sponges, not tissue or cotton balls. Soap and Water Discharge Instruction: May shower and wash wound with dial antibacterial soap and water prior to dressing change. Peri-Wound Care Sween Lotion (Moisturizing lotion) Discharge Instruction: Apply moisturizing lotion as directed Topical Primary Dressing KerraCel Ag  wound with wound cleanser prior to applying a clean dressing using gauze sponges, not tissue or cotton balls. Soap and Water Discharge Instruction: May shower and wash wound with dial antibacterial soap and water prior to dressing change. Peri-Wound Care Sween Lotion (Moisturizing lotion) Discharge Instruction: Apply moisturizing lotion as directed Topical Primary Dressing KerraCel Ag Gelling Fiber Dressing, 4x5 in (silver  alginate) Discharge Instruction: Apply silver alginate to wound bed as instructed Secondary Dressing Woven Gauze Sponge, Non-Sterile 4x4 in Discharge Instruction: Apply over primary dressing as directed. ABD Pad, 5x9 Discharge Instruction: Apply over primary dressing as directed. Secured With Compression Wrap Kerlix Roll 4.5x3.1 (in/yd) Discharge Instruction: Apply Kerlix and Coban compression as directed. Coban Self-Adherent Wrap 4x5 (in/yd) Discharge Instruction: Apply over Kerlix as directed. Compression Stockings Add-Ons Electronic Signature(s) Signed: 06/21/2020 5:53:33 PM By: [name] [name] Signed: 06/23/2020 5:49:32 PM By: [name] [name] Previous Signature: 06/21/2020 1:49:11 PM Version By: [name] [name] Entered By: [name] [name] on 06/21/2020 17:26:55 -------------------------------------------------------------------------------- Wound Assessment Details Patient Name: Date of Service: [name] Foley F. 06/21/2020 11:00 A M Medical Record Number: [id number] Patient Account Number: [id number] Date of Birth/Sex: Treating [name]: July 08, 1945 (75 y.o. [name] [name] Primary Care Kemper Hochman: [name] [name] Other Clinician: Referring Ahrianna Siglin: Treating Stefania Goulart/Extender: [name] [name] [name] [name] Weeks in Treatment: 218 Wound Status Wound Number: 28 Primary Diabetic Wound/Ulcer of the Lower Extremity Etiology: Wound Location: Right, Dorsal Foot Wound Open Wounding Event: Gradually Appeared Status: Date Acquired: 04/26/2020 Comorbid Anemia, Sleep Apnea, Arrhythmia, Congestive Heart Failure, Weeks Of Treatment: 8 History: Coronary Artery Disease, Hypertension, Myocardial Infarction, Clustered Wound: Yes Type II Diabetes, Gout, Neuropathy Photos Wound Measurements Length: (cm) 0.3 Width: (cm) 0.3 Depth: (cm) 0.1 Clustered Quantity: 2 Area: (cm) 0.071 Volume: (cm) 0.007 % Reduction in Area: 93.2% % Reduction in Volume: 93.3% Epithelialization: Small  (1-33%) Tunneling: No Undermining: No Wound Description Classification: Grade 1 Wound Margin: Flat and Intact Exudate Amount: Small Exudate Type: Serosanguineous Exudate Color: red, brown Foul Odor After Cleansing: No Slough/Fibrino Yes Wound Bed Granulation Amount: Small (1-33%) Exposed Structure Granulation Quality: Pink Fascia Exposed: No Necrotic Amount: Large (67-100%) Fat Layer (Subcutaneous Tissue) Exposed: Yes Necrotic Quality: Adherent Slough Tendon Exposed: No Muscle Exposed: No Joint Exposed: No Bone Exposed: No Treatment Notes Wound #28 (Foot) Wound Laterality: Dorsal, Right Cleanser Wound Cleanser Discharge Instruction: Cleanse the wound with wound cleanser prior to applying a clean dressing using gauze sponges, not tissue or cotton balls. Soap and Water Discharge Instruction: May shower and wash wound with dial antibacterial soap and water prior to dressing change. Peri-Wound Care Sween Lotion (Moisturizing lotion) Discharge Instruction: Apply moisturizing lotion as directed Topical Primary Dressing KerraCel Ag Gelling Fiber Dressing, 4x5 in (silver alginate) Discharge Instruction: Apply silver alginate to wound bed as instructed Secondary Dressing Woven Gauze Sponge, Non-Sterile 4x4 in Discharge Instruction: Apply over primary dressing as directed. ABD Pad, 5x9 Discharge Instruction: Apply over primary dressing as directed. Secured With Compression Wrap Kerlix Roll 4.5x3.1 (in/yd) Discharge Instruction: Apply Kerlix and Coban compression as directed. Coban Self-Adherent Wrap 4x5 (in/yd) Discharge Instruction: Apply over Kerlix as directed. Compression Stockings Add-Ons Electronic Signature(s) Signed: 06/21/2020 5:53:33 PM By: [name] [name] Signed: 06/23/2020 5:49:32 PM By: [name] [name] Previous Signature: 06/21/2020 1:49:11 PM Version By: [name] [name] Entered By: [name] [name] on 06/21/2020  17:23:32 -------------------------------------------------------------------------------- Wound Assessment Details Patient Name: Date of Service: [name] Foley F. 06/21/2020 11:00 A M Medical Record Number: [id number] Patient Account Number: [id number] Date of Birth/Sex: Treating [name]: 1945/07/26 (75 y.o. M) Rolin Barry, Tammi Klippel  Gelling Fiber Dressing, 2x2 in (silver alginate) Discharge Instruction: Apply silver alginate to wound bed as instructed Secondary Dressing Woven Gauze Sponge, Non-Sterile 4x4 in Discharge  Instruction: Apply over primary dressing as directed. ABD Pad, 5x9 Discharge Instruction: Apply over primary dressing as directed. Secured With Compression Wrap Kerlix Roll 4.5x3.1 (in/yd) Discharge Instruction: Apply Kerlix and Coban compression as directed. Coban Self-Adherent Wrap 4x5 (in/yd) Discharge Instruction: Apply over Kerlix as directed. Compression Stockings Add-Ons Electronic Signature(s) Signed: 06/21/2020 5:53:33 PM By: [name] [name] Signed: 06/23/2020 5:49:32 PM By: [name] [name] Previous Signature: 06/21/2020 1:49:11 PM Version By: [name] [name] Entered By: [name] [name] on 06/21/2020 17:22:57 -------------------------------------------------------------------------------- Wound Assessment Details Patient Name: Date of Service: [name] Foley F. 06/21/2020 11:00 A M Medical Record Number: [id number] Patient Account Number: [id number] Date of Birth/Sex: Treating [name]: 1945/08/16 (75 y.o. [name] [name] Primary Care Ambre Kobayashi: [name] [name] Other Clinician: Referring Berel Najjar: Treating Wilbur Labuda/Extender: [name] [name] [name] [name] Weeks in Treatment: 218 Wound Status Wound Number: 34 Primary Diabetic Wound/Ulcer of the Lower Extremity Etiology: Wound Location: Right, Medial, Dorsal Foot Wound Open Wounding Event: Gradually Appeared Status: Date Acquired: 06/14/2020 Comorbid Anemia, Sleep Apnea, Arrhythmia, Congestive Heart Failure, Weeks Of Treatment: 1 History: Coronary Artery Disease, Hypertension, Myocardial Infarction, Clustered Wound: No Type II Diabetes, Gout, Neuropathy Photos Wound Measurements Length: (cm) 0.6 Width: (cm) 0.5 Depth: (cm) 0.1 Area: (cm) 0.236 Volume: (cm) 0.024 % Reduction in Area: 14.2% % Reduction in Volume: 11.1% Epithelialization: Small (1-33%) Tunneling: No Undermining: No Wound Description Classification: Grade 2 Wound Margin: Flat and Intact Exudate Amount: Small Exudate Type: Serosanguineous Exudate Color:  red, brown Foul Odor After Cleansing: No Slough/Fibrino Yes Wound Bed Granulation Amount: None Present (0%) Exposed Structure Necrotic Amount: Large (67-100%) Fascia Exposed: No Necrotic Quality: Eschar, Adherent Slough Fat Layer (Subcutaneous Tissue) Exposed: Yes Tendon Exposed: No Muscle Exposed: No Joint Exposed: No Bone Exposed: No Treatment Notes Wound #34 (Foot) Wound Laterality: Dorsal, Right, Medial Cleanser Wound Cleanser Discharge Instruction: Cleanse the wound with wound cleanser prior to applying a clean dressing using gauze sponges, not tissue or cotton balls. Soap and Water Discharge Instruction: May shower and wash wound with dial antibacterial soap and water prior to dressing change. Peri-Wound Care Sween Lotion (Moisturizing lotion) Discharge Instruction: Apply moisturizing lotion as directed Topical Primary Dressing KerraCel Ag Gelling Fiber Dressing, 4x5 in (silver alginate) Discharge Instruction: Apply silver alginate to wound bed as instructed Secondary Dressing Woven Gauze Sponge, Non-Sterile 4x4 in Discharge Instruction: Apply over primary dressing as directed. ABD Pad, 5x9 Discharge Instruction: Apply over primary dressing as directed. Secured With Compression Wrap Kerlix Roll 4.5x3.1 (in/yd) Discharge Instruction: Apply Kerlix and Coban compression as directed. Coban Self-Adherent Wrap 4x5 (in/yd) Discharge Instruction: Apply over Kerlix as directed. Compression Stockings Add-Ons Electronic Signature(s) Signed: 06/21/2020 5:53:33 PM By: [name] [name] Signed: 06/23/2020 5:49:32 PM By: [name] [name] Previous Signature: 06/21/2020 1:49:11 PM Version By: [name] [name] Entered By: [name] [name] on 06/21/2020 17:23:57 -------------------------------------------------------------------------------- Wound Assessment Details Patient Name: Date of Service: [name] Foley F. 06/21/2020 11:00 A M Medical Record Number: [id number] Patient Account Number:  [id number] Date of Birth/Sex: Treating [name]: 05-18-1945 (75 y.o. [name] [name] Primary Care Trevious Rampey: [name] [name] Other Clinician: Referring Guss Farruggia: Treating Axel Meas/Extender: [name] [name] [name] [name] Weeks in Treatment: 218 Wound Status Wound Number: 35 Primary Diabetic Wound/Ulcer of the Lower Extremity Etiology: Wound Location: Left, Lateral, Dorsal Foot Wound Open Wounding Event: Gradually Appeared Status: Date Acquired: 05/24/2020 Comorbid Anemia, Sleep Apnea, Arrhythmia, Congestive Heart Failure, Weeks Of

## 2020-06-24 ENCOUNTER — Other Ambulatory Visit (HOSPITAL_COMMUNITY): Payer: Self-pay | Admitting: Internal Medicine

## 2020-06-24 ENCOUNTER — Encounter (HOSPITAL_COMMUNITY): Payer: Self-pay | Admitting: Vascular Surgery

## 2020-06-28 ENCOUNTER — Telehealth (HOSPITAL_COMMUNITY): Payer: Self-pay | Admitting: Cardiology

## 2020-06-28 ENCOUNTER — Other Ambulatory Visit: Payer: Self-pay

## 2020-06-28 ENCOUNTER — Encounter (HOSPITAL_BASED_OUTPATIENT_CLINIC_OR_DEPARTMENT_OTHER): Payer: Medicare HMO | Admitting: Physician Assistant

## 2020-06-28 DIAGNOSIS — L97524 Non-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: Secondary | ICD-10-CM | POA: Diagnosis not present

## 2020-06-28 DIAGNOSIS — E11621 Type 2 diabetes mellitus with foot ulcer: Secondary | ICD-10-CM | POA: Diagnosis not present

## 2020-06-28 DIAGNOSIS — L97512 Non-pressure chronic ulcer of other part of right foot with fat layer exposed: Secondary | ICD-10-CM | POA: Diagnosis not present

## 2020-06-28 DIAGNOSIS — L97522 Non-pressure chronic ulcer of other part of left foot with fat layer exposed: Secondary | ICD-10-CM | POA: Diagnosis not present

## 2020-06-28 DIAGNOSIS — E1122 Type 2 diabetes mellitus with diabetic chronic kidney disease: Secondary | ICD-10-CM | POA: Diagnosis not present

## 2020-06-28 DIAGNOSIS — E11622 Type 2 diabetes mellitus with other skin ulcer: Secondary | ICD-10-CM | POA: Diagnosis not present

## 2020-06-28 DIAGNOSIS — L97812 Non-pressure chronic ulcer of other part of right lower leg with fat layer exposed: Secondary | ICD-10-CM | POA: Diagnosis not present

## 2020-06-28 DIAGNOSIS — L97511 Non-pressure chronic ulcer of other part of right foot limited to breakdown of skin: Secondary | ICD-10-CM | POA: Diagnosis not present

## 2020-06-28 DIAGNOSIS — L89613 Pressure ulcer of right heel, stage 3: Secondary | ICD-10-CM | POA: Diagnosis not present

## 2020-06-28 DIAGNOSIS — L97811 Non-pressure chronic ulcer of other part of right lower leg limited to breakdown of skin: Secondary | ICD-10-CM | POA: Diagnosis not present

## 2020-06-28 DIAGNOSIS — L97321 Non-pressure chronic ulcer of left ankle limited to breakdown of skin: Secondary | ICD-10-CM | POA: Diagnosis not present

## 2020-06-28 DIAGNOSIS — L97221 Non-pressure chronic ulcer of left calf limited to breakdown of skin: Secondary | ICD-10-CM | POA: Diagnosis not present

## 2020-06-28 DIAGNOSIS — I872 Venous insufficiency (chronic) (peripheral): Secondary | ICD-10-CM | POA: Diagnosis not present

## 2020-06-28 NOTE — Telephone Encounter (Signed)
Attempting to contact patient regarding medication refills and need for followup Pt report his daughter will call back within the hour to schedule as she assists will transportation

## 2020-06-28 NOTE — Telephone Encounter (Signed)
Opened in error

## 2020-06-29 NOTE — Progress Notes (Signed)
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 Bleeding: Minimum Hemostasis Achieved: Pressure End Time: 13:48 Procedural Pain: 0 Post Procedural Pain: 0 Response to Treatment: Procedure was tolerated well Level of Consciousness (Post- Awake and Alert procedure): Post Debridement Measurements of Total Wound Length: (cm) 3.4 Width: (cm) 1.1 Depth: (cm) 0.2 Volume: (cm) 0.587 Character of Wound/Ulcer Post Debridement: Improved Severity of Tissue Post Debridement: Fat layer exposed Post  Procedure Diagnosis Same as Pre-procedure Electronic Signature(s) Signed: 06/29/2020 8:28:17 AM By: Worthy Keeler PA-C Signed: 06/29/2020 5:36:09 PM By: [name] Entered By: [name] on 06/28/2020 17:53:47 -------------------------------------------------------------------------------- Debridement Details Patient Name: Date of Service: [name] 06/28/2020 12:30 PM Medical Record Number: [id number] Patient Account Number: [id number] Date of Birth/Sex: Treating RN: 02/04/1946 (75 y.o. [name], [name] Primary Care Provider: [name], [name] Other Clinician: Referring Provider: Treating Provider/Extender: Stone III, Champagne Paletta [name], [name] Weeks in Treatment: 219 Debridement Performed for Assessment: Wound #28 Right,Dorsal Foot Performed By: Physician Worthy Keeler, PA Debridement Type: Debridement Severity of Tissue Pre Debridement: Fat layer exposed Level of Consciousness (Pre-procedure): Awake and Alert Pre-procedure Verification/Time Out Yes - 13:48 Taken: Start Time: 13:48 Pain Control: Lidocaine T Area Debrided (L x W): otal 0.3 (cm) x 0.3 (cm) = 0.09 (cm) Tissue and other material debrided: Viable, Non-Viable, Slough, Subcutaneous, Skin: Dermis , Slough Level: Skin/Subcutaneous Tissue Debridement Description: Excisional Instrument: Curette Bleeding: Minimum Hemostasis Achieved: Pressure End Time: 13:49 Procedural Pain: 0 Post Procedural Pain: 0 Response to Treatment: Procedure was tolerated well Level of Consciousness (Post- Awake and Alert procedure): Post Debridement Measurements of Total Wound Length: (cm) 0.3 Width: (cm) 0.3 Depth: (cm) 0.1 Volume: (cm) 0.007 Character of Wound/Ulcer Post Debridement: Improved Severity of Tissue Post Debridement: Fat layer exposed Post Procedure Diagnosis Same as Pre-procedure Electronic Signature(s) Signed: 06/29/2020 8:28:17 AM By: Worthy Keeler PA-C Signed: 06/29/2020 5:36:09 PM By:  [name] Entered By: [name] on 06/28/2020 17:54:34 -------------------------------------------------------------------------------- Debridement Details Patient Name: Date of Service: [name] 06/28/2020 12:30 PM Medical Record Number: [id number] Patient Account Number: [id number] Date of Birth/Sex: Treating RN: 07/26/1945 (75 y.o. [name], [name] Primary Care Provider: [name], [name] Other Clinician: Referring Provider: Treating Provider/Extender: Stone III, Boyd Litaker [name], [name] Weeks in Treatment: 219 Debridement Performed for Assessment: Wound #29 Right Calcaneus Performed By: Physician Worthy Keeler, PA Debridement Type: Debridement Severity of Tissue Pre Debridement: Fat layer exposed Level of Consciousness (Pre-procedure): Awake and Alert Pre-procedure Verification/Time Out Yes - 13:49 Taken: Start Time: 13:49 Pain Control: Lidocaine T Area Debrided (L x W): otal 0.5 (cm) x 0.5 (cm) = 0.25 (cm) Tissue and other material debrided: Viable, Non-Viable, Slough, Subcutaneous, Skin: Dermis , Slough Level: Skin/Subcutaneous Tissue Debridement Description: Excisional Instrument: Curette Bleeding: Minimum Hemostasis Achieved: Pressure End Time: 13:50 Procedural Pain: 0 Post Procedural Pain: 0 Response to Treatment: Procedure was tolerated well Level of Consciousness (Post- Awake and Alert procedure): Post Debridement Measurements  of Total Wound Length: (cm) 0.5 Width: (cm) 0.5 Depth: (cm) 0.1 Volume: (cm) 0.02 Character of Wound/Ulcer Post Debridement: Improved Severity of Tissue Post Debridement: Fat layer exposed Post Procedure Diagnosis Same as Pre-procedure Electronic Signature(s) Signed: 06/29/2020 8:28:17 AM By: Worthy Keeler PA-C Signed: 06/29/2020 5:36:09 PM By: [name] Entered By: [name] on 06/28/2020  17:55:18 -------------------------------------------------------------------------------- Debridement Details Patient Name: Date of Service: [name] 06/28/2020 12:30 PM Medical Record Number: [id number] Patient Account Number: [id number] Date of Birth/Sex: Treating RN: 06-17-1945 (75 y.o. [name], [name] Primary Care Provider: [name], [name] Other Clinician: Referring Provider: Treating Provider/Extender: Stone III, Gaelen Brager [name], [name] Weeks in Treatment: 219 Debridement Performed for Assessment: Wound #32 Left,Distal,Dorsal Foot Performed By: Physician Worthy Keeler, PA Debridement Type: Debridement Severity of Tissue Pre Debridement: Fat layer exposed Level of Consciousness (Pre-procedure): Awake and Alert Pre-procedure Verification/Time Out Yes - 13:50 Taken: Start Time: 13:50 Pain Control: Lidocaine T Area Debrided (L x W): otal 1.4 (cm) x 0.6 (cm) = 0.84 (cm) Tissue and other material debrided: Viable, Non-Viable, Slough, Subcutaneous, Skin: Dermis , Slough Level: Skin/Subcutaneous Tissue Debridement Description: Excisional Instrument: Curette Bleeding: Minimum Hemostasis Achieved: Pressure End Time: 13:51 Procedural Pain: 0 Post Procedural Pain: 0 Response to Treatment: Procedure was tolerated well Level of Consciousness (Post- Awake and Alert procedure): Post Debridement Measurements of Total Wound Length: (cm) 1.4 Width: (cm) 0.6 Depth: (cm) 0.2 Volume: (cm) 0.132 Character of Wound/Ulcer Post Debridement: Improved Severity of Tissue Post Debridement: Fat layer exposed Post Procedure Diagnosis Same as Pre-procedure Electronic Signature(s) Signed: 06/29/2020 8:28:17 AM By: Worthy Keeler PA-C Signed: 06/29/2020 5:36:09 PM By: [name] Entered By: [name] on 06/28/2020 17:56:29 -------------------------------------------------------------------------------- Debridement Details Patient Name: Date of Service: [name] 06/28/2020 12:30 PM Medical Record Number: [id number] Patient Account Number: [id number] Date of Birth/Sex: Treating RN: [birth year] (75 y.o. [name], [name] Primary Care Provider: Other Clinician: [name], [name] Referring Provider: Treating Provider/Extender: Stone III, Marya Lowden [name], [name] Weeks in Treatment: 219 Debridement Performed for Assessment: Wound #33 Right,Distal,Dorsal Foot Performed By: Physician Worthy Keeler, PA Debridement Type: Debridement Severity of Tissue Pre Debridement: Fat layer exposed Level of Consciousness (Pre-procedure): Awake and Alert Pre-procedure Verification/Time Out Yes - 13:52 Taken: Start Time: 13:52 Pain Control: Lidocaine T Area Debrided (L x W): otal 0.3 (cm) x 0.3 (cm) = 0.09 (cm) Tissue and other material debrided: Viable, Non-Viable, Slough, Subcutaneous, Skin: Dermis , Slough Level: Skin/Subcutaneous Tissue Debridement Description: Excisional Instrument: Curette Bleeding: Minimum Hemostasis Achieved: Pressure End Time: 13:54 Procedural Pain: 0 Post Procedural Pain: 0 Response to Treatment: Procedure was tolerated well Level of Consciousness (Post- Awake and Alert procedure): Post Debridement Measurements of Total Wound Length: (cm) 0.3 Width: (cm) 0.3 Depth: (cm) 0.1 Volume: (cm) 0.007 Character of Wound/Ulcer Post Debridement: Improved Severity of Tissue Post Debridement: Fat layer exposed Post Procedure Diagnosis Same as Pre-procedure Electronic Signature(s) Signed: 06/29/2020 8:28:17 AM By: Worthy Keeler PA-C Signed: 06/29/2020 5:36:09 PM By: [name] Entered By: [name] on 06/28/2020 17:57:30 -------------------------------------------------------------------------------- Debridement Details Patient Name: Date of Service: [name] 06/28/2020 12:30 PM Medical Record Number: [id number] Patient Account Number: [id number] Date of Birth/Sex: Treating RN: September 26, 1945 (75 y.o. [name] Primary Care Provider: [name], [name] Other Clinician: Referring Provider: Treating Provider/Extender: Stone III, Jonnae Fonseca [name], [name] Weeks in Treatment: 219 Debridement Performed for Assessment: Wound #34 Right,Medial,Dorsal Foot Performed By: Physician Worthy Keeler, PA Debridement Type: Debridement Severity  as instructed Secondary Dressing: Woven Gauze Sponge, Non-Sterile 4x4 in (Generic) 1 x Per Week/15 Days Discharge Instructions: Apply over primary dressing as directed. Secondary Dressing: ABD Pad, 5x9 1 x Per Week/15 Days Discharge Instructions: Apply over primary dressing as directed. Compression Wrap: Kerlix Roll 4.5x3.1 (in/yd) (Generic) 1 x Per Week/15 Days Discharge Instructions: Apply Kerlix and Coban compression as directed. Compression Wrap: Coban Self-Adherent Wrap 4x5 (in/yd) (Generic) 1 x Per Week/15 Days Discharge Instructions: Apply over Kerlix as directed. 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) Electronic Signature(s) Signed: 06/29/2020 5:36:09 PM By:  [name] Signed: 06/29/2020 6:04:37 PM By: Worthy Keeler PA-C Previous Signature: 06/29/2020 8:28:17 AM Version By: Worthy Keeler PA-C Entered By: [name] on 06/29/2020 14:33:45 Prescription 06/28/2020 -------------------------------------------------------------------------------- Laverda Page PA Patient Name: Provider: June 16, 1945 [id number] Date of Birth: NPI#: [name] [id number] Sex: DEA #: [id number] Phone #: License #: [place] Patient Address: [address] [place], [place] [address] [place], [place] [address] [phone] Allergies Sulfa Provider's Orders MRI, lower extremity with and without contrast - ICD10: L97.524 - left foot R/T non healing foot ulcer Hand Signature: Date(s): Electronic Signature(s) Signed: 06/29/2020 5:36:09 PM By: [name] Signed: 06/29/2020 6:04:37 PM By: Worthy Keeler PA-C Entered By: [name] on 06/29/2020 14:33:49 -------------------------------------------------------------------------------- Problem List Details Patient Name: Date of Service: [name] 06/28/2020 12:30 PM Medical Record Number: [id number] Patient Account Number: [id number] Date of Birth/Sex: Treating RN: August 08, 1945 (75 y.o. [name], [name] Primary Care Provider: [name], [name] Other Clinician: Referring Provider: Treating Provider/Extender: Stone III, Tracy Gerken [name], [name] Weeks in Treatment: 219 Active Problems ICD-10 Encounter Code Description Active Date MDM Diagnosis E11.621 Type 2 diabetes mellitus with foot ulcer 04/17/2016 No Yes L89.613 Pressure ulcer of right heel, stage 3 04/17/2016 No Yes L97.518 Non-pressure chronic ulcer of other part of right foot with other specified 05/17/2020 No Yes severity L97.221 Non-pressure chronic ulcer of left calf limited to breakdown of skin 08/15/2017 No Yes L97.321 Non-pressure chronic  ulcer of left ankle limited to breakdown of skin 07/01/2018 No Yes L97.521 Non-pressure chronic ulcer of other part of left foot limited to breakdown of 02/17/2019 No Yes skin L97.524 Non-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 06/14/2020 No Yes Inactive Problems ICD-10 Code Description Active Date Inactive Date L03.032 Cellulitis of left toe 01/06/2019 01/06/2019 [id number] Pressure ulcer of left heel, stage 3 12/04/2016 12/04/2016 I25.119 Atherosclerotic heart disease of native coronary artery with unspecified angina 04/17/2016 04/17/2016 pectoris L97.821 Non-pressure chronic ulcer of other part of left lower leg limited to breakdown of skin 01/06/2019 01/06/2019 S51.811D Laceration without foreign body of right forearm, subsequent encounter 10/22/2017 10/22/2017 [id number] Acute diastolic (congestive) heart failure 04/17/2016 04/17/2016 L03.116 Cellulitis of left lower limb 12/24/2017 12/24/2017 L89.620 Pressure ulcer of left heel, unstageable 07/21/2019 07/21/2019 L97.211 Non-pressure chronic ulcer of right calf limited to breakdown of skin 07/21/2019 07/21/2019 S80.211D Abrasion, right knee, subsequent encounter 08/18/2019 08/18/2019 Resolved Problems ICD-10 Code Description Active Date Resolved Date L89.512 Pressure ulcer of right ankle, stage 2 04/17/2016 04/17/2016 L89.522 Pressure ulcer of left ankle, stage 2 04/17/2016 04/17/2016 Electronic Signature(s) Signed: 06/29/2020 8:28:17 AM By: Worthy Keeler PA-C Entered By: Worthy Keeler on 06/28/2020 13:10:21 -------------------------------------------------------------------------------- Progress Note  as instructed Secondary Dressing: Woven Gauze Sponge, Non-Sterile 4x4 in (Generic) 1 x Per Week/15 Days Discharge Instructions: Apply over primary dressing as directed. Secondary Dressing: ABD Pad, 5x9 1 x Per Week/15 Days Discharge Instructions: Apply over primary dressing as directed. Compression Wrap: Kerlix Roll 4.5x3.1 (in/yd) (Generic) 1 x Per Week/15 Days Discharge Instructions: Apply Kerlix and Coban compression as directed. Compression Wrap: Coban Self-Adherent Wrap 4x5 (in/yd) (Generic) 1 x Per Week/15 Days Discharge Instructions: Apply over Kerlix as directed. 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) Electronic Signature(s) Signed: 06/29/2020 5:36:09 PM By:  [name] Signed: 06/29/2020 6:04:37 PM By: Worthy Keeler PA-C Previous Signature: 06/29/2020 8:28:17 AM Version By: Worthy Keeler PA-C Entered By: [name] on 06/29/2020 14:33:45 Prescription 06/28/2020 -------------------------------------------------------------------------------- Laverda Page PA Patient Name: Provider: June 16, 1945 [id number] Date of Birth: NPI#: [name] [id number] Sex: DEA #: [id number] Phone #: License #: [place] Patient Address: [address] [place], [place] [address] [place], [place] [address] [phone] Allergies Sulfa Provider's Orders MRI, lower extremity with and without contrast - ICD10: L97.524 - left foot R/T non healing foot ulcer Hand Signature: Date(s): Electronic Signature(s) Signed: 06/29/2020 5:36:09 PM By: [name] Signed: 06/29/2020 6:04:37 PM By: Worthy Keeler PA-C Entered By: [name] on 06/29/2020 14:33:49 -------------------------------------------------------------------------------- Problem List Details Patient Name: Date of Service: [name] 06/28/2020 12:30 PM Medical Record Number: [id number] Patient Account Number: [id number] Date of Birth/Sex: Treating RN: August 08, 1945 (75 y.o. [name], [name] Primary Care Provider: [name], [name] Other Clinician: Referring Provider: Treating Provider/Extender: Stone III, Tracy Gerken [name], [name] Weeks in Treatment: 219 Active Problems ICD-10 Encounter Code Description Active Date MDM Diagnosis E11.621 Type 2 diabetes mellitus with foot ulcer 04/17/2016 No Yes L89.613 Pressure ulcer of right heel, stage 3 04/17/2016 No Yes L97.518 Non-pressure chronic ulcer of other part of right foot with other specified 05/17/2020 No Yes severity L97.221 Non-pressure chronic ulcer of left calf limited to breakdown of skin 08/15/2017 No Yes L97.321 Non-pressure chronic  ulcer of left ankle limited to breakdown of skin 07/01/2018 No Yes L97.521 Non-pressure chronic ulcer of other part of left foot limited to breakdown of 02/17/2019 No Yes skin L97.524 Non-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 06/14/2020 No Yes Inactive Problems ICD-10 Code Description Active Date Inactive Date L03.032 Cellulitis of left toe 01/06/2019 01/06/2019 [id number] Pressure ulcer of left heel, stage 3 12/04/2016 12/04/2016 I25.119 Atherosclerotic heart disease of native coronary artery with unspecified angina 04/17/2016 04/17/2016 pectoris L97.821 Non-pressure chronic ulcer of other part of left lower leg limited to breakdown of skin 01/06/2019 01/06/2019 S51.811D Laceration without foreign body of right forearm, subsequent encounter 10/22/2017 10/22/2017 [id number] Acute diastolic (congestive) heart failure 04/17/2016 04/17/2016 L03.116 Cellulitis of left lower limb 12/24/2017 12/24/2017 L89.620 Pressure ulcer of left heel, unstageable 07/21/2019 07/21/2019 L97.211 Non-pressure chronic ulcer of right calf limited to breakdown of skin 07/21/2019 07/21/2019 S80.211D Abrasion, right knee, subsequent encounter 08/18/2019 08/18/2019 Resolved Problems ICD-10 Code Description Active Date Resolved Date L89.512 Pressure ulcer of right ankle, stage 2 04/17/2016 04/17/2016 L89.522 Pressure ulcer of left ankle, stage 2 04/17/2016 04/17/2016 Electronic Signature(s) Signed: 06/29/2020 8:28:17 AM By: Worthy Keeler PA-C Entered By: Worthy Keeler on 06/28/2020 13:10:21 -------------------------------------------------------------------------------- Progress Note  of Total Wound Length: (cm) 0.5 Width: (cm) 0.5 Depth: (cm) 0.1 Volume: (cm) 0.02 Character of Wound/Ulcer Post Debridement: Improved Severity of Tissue Post Debridement: Fat layer exposed Post Procedure Diagnosis Same as Pre-procedure Electronic Signature(s) Signed: 06/29/2020 8:28:17 AM By: Worthy Keeler PA-C Signed: 06/29/2020 5:36:09 PM By: [name] Entered By: [name] on 06/28/2020  17:55:18 -------------------------------------------------------------------------------- Debridement Details Patient Name: Date of Service: [name] 06/28/2020 12:30 PM Medical Record Number: [id number] Patient Account Number: [id number] Date of Birth/Sex: Treating RN: 06-17-1945 (75 y.o. [name], [name] Primary Care Provider: [name], [name] Other Clinician: Referring Provider: Treating Provider/Extender: Stone III, Gaelen Brager [name], [name] Weeks in Treatment: 219 Debridement Performed for Assessment: Wound #32 Left,Distal,Dorsal Foot Performed By: Physician Worthy Keeler, PA Debridement Type: Debridement Severity of Tissue Pre Debridement: Fat layer exposed Level of Consciousness (Pre-procedure): Awake and Alert Pre-procedure Verification/Time Out Yes - 13:50 Taken: Start Time: 13:50 Pain Control: Lidocaine T Area Debrided (L x W): otal 1.4 (cm) x 0.6 (cm) = 0.84 (cm) Tissue and other material debrided: Viable, Non-Viable, Slough, Subcutaneous, Skin: Dermis , Slough Level: Skin/Subcutaneous Tissue Debridement Description: Excisional Instrument: Curette Bleeding: Minimum Hemostasis Achieved: Pressure End Time: 13:51 Procedural Pain: 0 Post Procedural Pain: 0 Response to Treatment: Procedure was tolerated well Level of Consciousness (Post- Awake and Alert procedure): Post Debridement Measurements of Total Wound Length: (cm) 1.4 Width: (cm) 0.6 Depth: (cm) 0.2 Volume: (cm) 0.132 Character of Wound/Ulcer Post Debridement: Improved Severity of Tissue Post Debridement: Fat layer exposed Post Procedure Diagnosis Same as Pre-procedure Electronic Signature(s) Signed: 06/29/2020 8:28:17 AM By: Worthy Keeler PA-C Signed: 06/29/2020 5:36:09 PM By: [name] Entered By: [name] on 06/28/2020 17:56:29 -------------------------------------------------------------------------------- Debridement Details Patient Name: Date of Service: [name] 06/28/2020 12:30 PM Medical Record Number: [id number] Patient Account Number: [id number] Date of Birth/Sex: Treating RN: [birth year] (75 y.o. [name], [name] Primary Care Provider: Other Clinician: [name], [name] Referring Provider: Treating Provider/Extender: Stone III, Marya Lowden [name], [name] Weeks in Treatment: 219 Debridement Performed for Assessment: Wound #33 Right,Distal,Dorsal Foot Performed By: Physician Worthy Keeler, PA Debridement Type: Debridement Severity of Tissue Pre Debridement: Fat layer exposed Level of Consciousness (Pre-procedure): Awake and Alert Pre-procedure Verification/Time Out Yes - 13:52 Taken: Start Time: 13:52 Pain Control: Lidocaine T Area Debrided (L x W): otal 0.3 (cm) x 0.3 (cm) = 0.09 (cm) Tissue and other material debrided: Viable, Non-Viable, Slough, Subcutaneous, Skin: Dermis , Slough Level: Skin/Subcutaneous Tissue Debridement Description: Excisional Instrument: Curette Bleeding: Minimum Hemostasis Achieved: Pressure End Time: 13:54 Procedural Pain: 0 Post Procedural Pain: 0 Response to Treatment: Procedure was tolerated well Level of Consciousness (Post- Awake and Alert procedure): Post Debridement Measurements of Total Wound Length: (cm) 0.3 Width: (cm) 0.3 Depth: (cm) 0.1 Volume: (cm) 0.007 Character of Wound/Ulcer Post Debridement: Improved Severity of Tissue Post Debridement: Fat layer exposed Post Procedure Diagnosis Same as Pre-procedure Electronic Signature(s) Signed: 06/29/2020 8:28:17 AM By: Worthy Keeler PA-C Signed: 06/29/2020 5:36:09 PM By: [name] Entered By: [name] on 06/28/2020 17:57:30 -------------------------------------------------------------------------------- Debridement Details Patient Name: Date of Service: [name] 06/28/2020 12:30 PM Medical Record Number: [id number] Patient Account Number: [id number] Date of Birth/Sex: Treating RN: September 26, 1945 (75 y.o. [name] Primary Care Provider: [name], [name] Other Clinician: Referring Provider: Treating Provider/Extender: Stone III, Jonnae Fonseca [name], [name] Weeks in Treatment: 219 Debridement Performed for Assessment: Wound #34 Right,Medial,Dorsal Foot Performed By: Physician Worthy Keeler, PA Debridement Type: Debridement Severity  [name] Primary Care Provider: [name], [name] Other Clinician: Referring Provider: Treating Provider/Extender: Stone III, Madaleine Simmon [name], [name] Weeks in Treatment: 219 Debridement Performed for Assessment: Wound #26 Left,Dorsal Foot Performed By: Physician Worthy Keeler, PA Debridement Type: Debridement Severity of Tissue Pre Debridement: Fat layer exposed Level of Consciousness (Pre-procedure): Awake and Alert Pre-procedure Verification/Time Out Yes - 13:47 Taken: Start Time: 13:47 Pain Control: Lidocaine T Area Debrided (L x W): otal 3.4 (cm) x 1.1 (cm) = 3.74 (cm) Tissue and other material debrided: Viable, Non-Viable, Slough, Subcutaneous, Skin: Dermis , Slough Level: Skin/Subcutaneous Tissue Debridement Description: Excisional Instrument: Curette Bleeding: Minimum Hemostasis Achieved: Pressure End Time: 13:48 Procedural Pain: 0 Post Procedural Pain: 0 Response to Treatment: Procedure was tolerated well Level of Consciousness (Post- Awake and Alert procedure): Post Debridement Measurements of Total Wound Length: (cm) 3.4 Width: (cm) 1.1 Depth: (cm) 0.2 Volume: (cm) 0.587 Character of Wound/Ulcer Post Debridement: Improved Severity of Tissue Post Debridement: Fat layer exposed Post  Procedure Diagnosis Same as Pre-procedure Electronic Signature(s) Signed: 06/29/2020 8:28:17 AM By: Worthy Keeler PA-C Signed: 06/29/2020 5:36:09 PM By: [name] Entered By: [name] on 06/28/2020 17:53:47 -------------------------------------------------------------------------------- Debridement Details Patient Name: Date of Service: [name] 06/28/2020 12:30 PM Medical Record Number: [id number] Patient Account Number: [id number] Date of Birth/Sex: Treating RN: 02/04/1946 (75 y.o. [name], [name] Primary Care Provider: [name], [name] Other Clinician: Referring Provider: Treating Provider/Extender: Stone III, Champagne Paletta [name], [name] Weeks in Treatment: 219 Debridement Performed for Assessment: Wound #28 Right,Dorsal Foot Performed By: Physician Worthy Keeler, PA Debridement Type: Debridement Severity of Tissue Pre Debridement: Fat layer exposed Level of Consciousness (Pre-procedure): Awake and Alert Pre-procedure Verification/Time Out Yes - 13:48 Taken: Start Time: 13:48 Pain Control: Lidocaine T Area Debrided (L x W): otal 0.3 (cm) x 0.3 (cm) = 0.09 (cm) Tissue and other material debrided: Viable, Non-Viable, Slough, Subcutaneous, Skin: Dermis , Slough Level: Skin/Subcutaneous Tissue Debridement Description: Excisional Instrument: Curette Bleeding: Minimum Hemostasis Achieved: Pressure End Time: 13:49 Procedural Pain: 0 Post Procedural Pain: 0 Response to Treatment: Procedure was tolerated well Level of Consciousness (Post- Awake and Alert procedure): Post Debridement Measurements of Total Wound Length: (cm) 0.3 Width: (cm) 0.3 Depth: (cm) 0.1 Volume: (cm) 0.007 Character of Wound/Ulcer Post Debridement: Improved Severity of Tissue Post Debridement: Fat layer exposed Post Procedure Diagnosis Same as Pre-procedure Electronic Signature(s) Signed: 06/29/2020 8:28:17 AM By: Worthy Keeler PA-C Signed: 06/29/2020 5:36:09 PM By:  [name] Entered By: [name] on 06/28/2020 17:54:34 -------------------------------------------------------------------------------- Debridement Details Patient Name: Date of Service: [name] 06/28/2020 12:30 PM Medical Record Number: [id number] Patient Account Number: [id number] Date of Birth/Sex: Treating RN: 07/26/1945 (75 y.o. [name], [name] Primary Care Provider: [name], [name] Other Clinician: Referring Provider: Treating Provider/Extender: Stone III, Boyd Litaker [name], [name] Weeks in Treatment: 219 Debridement Performed for Assessment: Wound #29 Right Calcaneus Performed By: Physician Worthy Keeler, PA Debridement Type: Debridement Severity of Tissue Pre Debridement: Fat layer exposed Level of Consciousness (Pre-procedure): Awake and Alert Pre-procedure Verification/Time Out Yes - 13:49 Taken: Start Time: 13:49 Pain Control: Lidocaine T Area Debrided (L x W): otal 0.5 (cm) x 0.5 (cm) = 0.25 (cm) Tissue and other material debrided: Viable, Non-Viable, Slough, Subcutaneous, Skin: Dermis , Slough Level: Skin/Subcutaneous Tissue Debridement Description: Excisional Instrument: Curette Bleeding: Minimum Hemostasis Achieved: Pressure End Time: 13:50 Procedural Pain: 0 Post Procedural Pain: 0 Response to Treatment: Procedure was tolerated well Level of Consciousness (Post- Awake and Alert procedure): Post Debridement Measurements  [name] Primary Care Provider: [name], [name] Other Clinician: Referring Provider: Treating Provider/Extender: Stone III, Madaleine Simmon [name], [name] Weeks in Treatment: 219 Debridement Performed for Assessment: Wound #26 Left,Dorsal Foot Performed By: Physician Worthy Keeler, PA Debridement Type: Debridement Severity of Tissue Pre Debridement: Fat layer exposed Level of Consciousness (Pre-procedure): Awake and Alert Pre-procedure Verification/Time Out Yes - 13:47 Taken: Start Time: 13:47 Pain Control: Lidocaine T Area Debrided (L x W): otal 3.4 (cm) x 1.1 (cm) = 3.74 (cm) Tissue and other material debrided: Viable, Non-Viable, Slough, Subcutaneous, Skin: Dermis , Slough Level: Skin/Subcutaneous Tissue Debridement Description: Excisional Instrument: Curette Bleeding: Minimum Hemostasis Achieved: Pressure End Time: 13:48 Procedural Pain: 0 Post Procedural Pain: 0 Response to Treatment: Procedure was tolerated well Level of Consciousness (Post- Awake and Alert procedure): Post Debridement Measurements of Total Wound Length: (cm) 3.4 Width: (cm) 1.1 Depth: (cm) 0.2 Volume: (cm) 0.587 Character of Wound/Ulcer Post Debridement: Improved Severity of Tissue Post Debridement: Fat layer exposed Post  Procedure Diagnosis Same as Pre-procedure Electronic Signature(s) Signed: 06/29/2020 8:28:17 AM By: Worthy Keeler PA-C Signed: 06/29/2020 5:36:09 PM By: [name] Entered By: [name] on 06/28/2020 17:53:47 -------------------------------------------------------------------------------- Debridement Details Patient Name: Date of Service: [name] 06/28/2020 12:30 PM Medical Record Number: [id number] Patient Account Number: [id number] Date of Birth/Sex: Treating RN: 02/04/1946 (75 y.o. [name], [name] Primary Care Provider: [name], [name] Other Clinician: Referring Provider: Treating Provider/Extender: Stone III, Champagne Paletta [name], [name] Weeks in Treatment: 219 Debridement Performed for Assessment: Wound #28 Right,Dorsal Foot Performed By: Physician Worthy Keeler, PA Debridement Type: Debridement Severity of Tissue Pre Debridement: Fat layer exposed Level of Consciousness (Pre-procedure): Awake and Alert Pre-procedure Verification/Time Out Yes - 13:48 Taken: Start Time: 13:48 Pain Control: Lidocaine T Area Debrided (L x W): otal 0.3 (cm) x 0.3 (cm) = 0.09 (cm) Tissue and other material debrided: Viable, Non-Viable, Slough, Subcutaneous, Skin: Dermis , Slough Level: Skin/Subcutaneous Tissue Debridement Description: Excisional Instrument: Curette Bleeding: Minimum Hemostasis Achieved: Pressure End Time: 13:49 Procedural Pain: 0 Post Procedural Pain: 0 Response to Treatment: Procedure was tolerated well Level of Consciousness (Post- Awake and Alert procedure): Post Debridement Measurements of Total Wound Length: (cm) 0.3 Width: (cm) 0.3 Depth: (cm) 0.1 Volume: (cm) 0.007 Character of Wound/Ulcer Post Debridement: Improved Severity of Tissue Post Debridement: Fat layer exposed Post Procedure Diagnosis Same as Pre-procedure Electronic Signature(s) Signed: 06/29/2020 8:28:17 AM By: Worthy Keeler PA-C Signed: 06/29/2020 5:36:09 PM By:  [name] Entered By: [name] on 06/28/2020 17:54:34 -------------------------------------------------------------------------------- Debridement Details Patient Name: Date of Service: [name] 06/28/2020 12:30 PM Medical Record Number: [id number] Patient Account Number: [id number] Date of Birth/Sex: Treating RN: 07/26/1945 (75 y.o. [name], [name] Primary Care Provider: [name], [name] Other Clinician: Referring Provider: Treating Provider/Extender: Stone III, Boyd Litaker [name], [name] Weeks in Treatment: 219 Debridement Performed for Assessment: Wound #29 Right Calcaneus Performed By: Physician Worthy Keeler, PA Debridement Type: Debridement Severity of Tissue Pre Debridement: Fat layer exposed Level of Consciousness (Pre-procedure): Awake and Alert Pre-procedure Verification/Time Out Yes - 13:49 Taken: Start Time: 13:49 Pain Control: Lidocaine T Area Debrided (L x W): otal 0.5 (cm) x 0.5 (cm) = 0.25 (cm) Tissue and other material debrided: Viable, Non-Viable, Slough, Subcutaneous, Skin: Dermis , Slough Level: Skin/Subcutaneous Tissue Debridement Description: Excisional Instrument: Curette Bleeding: Minimum Hemostasis Achieved: Pressure End Time: 13:50 Procedural Pain: 0 Post Procedural Pain: 0 Response to Treatment: Procedure was tolerated well Level of Consciousness (Post- Awake and Alert procedure): Post Debridement Measurements  of Total Wound Length: (cm) 0.5 Width: (cm) 0.5 Depth: (cm) 0.1 Volume: (cm) 0.02 Character of Wound/Ulcer Post Debridement: Improved Severity of Tissue Post Debridement: Fat layer exposed Post Procedure Diagnosis Same as Pre-procedure Electronic Signature(s) Signed: 06/29/2020 8:28:17 AM By: Worthy Keeler PA-C Signed: 06/29/2020 5:36:09 PM By: [name] Entered By: [name] on 06/28/2020  17:55:18 -------------------------------------------------------------------------------- Debridement Details Patient Name: Date of Service: [name] 06/28/2020 12:30 PM Medical Record Number: [id number] Patient Account Number: [id number] Date of Birth/Sex: Treating RN: 06-17-1945 (75 y.o. [name], [name] Primary Care Provider: [name], [name] Other Clinician: Referring Provider: Treating Provider/Extender: Stone III, Gaelen Brager [name], [name] Weeks in Treatment: 219 Debridement Performed for Assessment: Wound #32 Left,Distal,Dorsal Foot Performed By: Physician Worthy Keeler, PA Debridement Type: Debridement Severity of Tissue Pre Debridement: Fat layer exposed Level of Consciousness (Pre-procedure): Awake and Alert Pre-procedure Verification/Time Out Yes - 13:50 Taken: Start Time: 13:50 Pain Control: Lidocaine T Area Debrided (L x W): otal 1.4 (cm) x 0.6 (cm) = 0.84 (cm) Tissue and other material debrided: Viable, Non-Viable, Slough, Subcutaneous, Skin: Dermis , Slough Level: Skin/Subcutaneous Tissue Debridement Description: Excisional Instrument: Curette Bleeding: Minimum Hemostasis Achieved: Pressure End Time: 13:51 Procedural Pain: 0 Post Procedural Pain: 0 Response to Treatment: Procedure was tolerated well Level of Consciousness (Post- Awake and Alert procedure): Post Debridement Measurements of Total Wound Length: (cm) 1.4 Width: (cm) 0.6 Depth: (cm) 0.2 Volume: (cm) 0.132 Character of Wound/Ulcer Post Debridement: Improved Severity of Tissue Post Debridement: Fat layer exposed Post Procedure Diagnosis Same as Pre-procedure Electronic Signature(s) Signed: 06/29/2020 8:28:17 AM By: Worthy Keeler PA-C Signed: 06/29/2020 5:36:09 PM By: [name] Entered By: [name] on 06/28/2020 17:56:29 -------------------------------------------------------------------------------- Debridement Details Patient Name: Date of Service: [name] 06/28/2020 12:30 PM Medical Record Number: [id number] Patient Account Number: [id number] Date of Birth/Sex: Treating RN: [birth year] (75 y.o. [name], [name] Primary Care Provider: Other Clinician: [name], [name] Referring Provider: Treating Provider/Extender: Stone III, Marya Lowden [name], [name] Weeks in Treatment: 219 Debridement Performed for Assessment: Wound #33 Right,Distal,Dorsal Foot Performed By: Physician Worthy Keeler, PA Debridement Type: Debridement Severity of Tissue Pre Debridement: Fat layer exposed Level of Consciousness (Pre-procedure): Awake and Alert Pre-procedure Verification/Time Out Yes - 13:52 Taken: Start Time: 13:52 Pain Control: Lidocaine T Area Debrided (L x W): otal 0.3 (cm) x 0.3 (cm) = 0.09 (cm) Tissue and other material debrided: Viable, Non-Viable, Slough, Subcutaneous, Skin: Dermis , Slough Level: Skin/Subcutaneous Tissue Debridement Description: Excisional Instrument: Curette Bleeding: Minimum Hemostasis Achieved: Pressure End Time: 13:54 Procedural Pain: 0 Post Procedural Pain: 0 Response to Treatment: Procedure was tolerated well Level of Consciousness (Post- Awake and Alert procedure): Post Debridement Measurements of Total Wound Length: (cm) 0.3 Width: (cm) 0.3 Depth: (cm) 0.1 Volume: (cm) 0.007 Character of Wound/Ulcer Post Debridement: Improved Severity of Tissue Post Debridement: Fat layer exposed Post Procedure Diagnosis Same as Pre-procedure Electronic Signature(s) Signed: 06/29/2020 8:28:17 AM By: Worthy Keeler PA-C Signed: 06/29/2020 5:36:09 PM By: [name] Entered By: [name] on 06/28/2020 17:57:30 -------------------------------------------------------------------------------- Debridement Details Patient Name: Date of Service: [name] 06/28/2020 12:30 PM Medical Record Number: [id number] Patient Account Number: [id number] Date of Birth/Sex: Treating RN: September 26, 1945 (75 y.o. [name] Primary Care Provider: [name], [name] Other Clinician: Referring Provider: Treating Provider/Extender: Stone III, Jonnae Fonseca [name], [name] Weeks in Treatment: 219 Debridement Performed for Assessment: Wound #34 Right,Medial,Dorsal Foot Performed By: Physician Worthy Keeler, PA Debridement Type: Debridement Severity  of Total Wound Length: (cm) 0.5 Width: (cm) 0.5 Depth: (cm) 0.1 Volume: (cm) 0.02 Character of Wound/Ulcer Post Debridement: Improved Severity of Tissue Post Debridement: Fat layer exposed Post Procedure Diagnosis Same as Pre-procedure Electronic Signature(s) Signed: 06/29/2020 8:28:17 AM By: Worthy Keeler PA-C Signed: 06/29/2020 5:36:09 PM By: [name] Entered By: [name] on 06/28/2020  17:55:18 -------------------------------------------------------------------------------- Debridement Details Patient Name: Date of Service: [name] 06/28/2020 12:30 PM Medical Record Number: [id number] Patient Account Number: [id number] Date of Birth/Sex: Treating RN: 06-17-1945 (75 y.o. [name], [name] Primary Care Provider: [name], [name] Other Clinician: Referring Provider: Treating Provider/Extender: Stone III, Gaelen Brager [name], [name] Weeks in Treatment: 219 Debridement Performed for Assessment: Wound #32 Left,Distal,Dorsal Foot Performed By: Physician Worthy Keeler, PA Debridement Type: Debridement Severity of Tissue Pre Debridement: Fat layer exposed Level of Consciousness (Pre-procedure): Awake and Alert Pre-procedure Verification/Time Out Yes - 13:50 Taken: Start Time: 13:50 Pain Control: Lidocaine T Area Debrided (L x W): otal 1.4 (cm) x 0.6 (cm) = 0.84 (cm) Tissue and other material debrided: Viable, Non-Viable, Slough, Subcutaneous, Skin: Dermis , Slough Level: Skin/Subcutaneous Tissue Debridement Description: Excisional Instrument: Curette Bleeding: Minimum Hemostasis Achieved: Pressure End Time: 13:51 Procedural Pain: 0 Post Procedural Pain: 0 Response to Treatment: Procedure was tolerated well Level of Consciousness (Post- Awake and Alert procedure): Post Debridement Measurements of Total Wound Length: (cm) 1.4 Width: (cm) 0.6 Depth: (cm) 0.2 Volume: (cm) 0.132 Character of Wound/Ulcer Post Debridement: Improved Severity of Tissue Post Debridement: Fat layer exposed Post Procedure Diagnosis Same as Pre-procedure Electronic Signature(s) Signed: 06/29/2020 8:28:17 AM By: Worthy Keeler PA-C Signed: 06/29/2020 5:36:09 PM By: [name] Entered By: [name] on 06/28/2020 17:56:29 -------------------------------------------------------------------------------- Debridement Details Patient Name: Date of Service: [name] 06/28/2020 12:30 PM Medical Record Number: [id number] Patient Account Number: [id number] Date of Birth/Sex: Treating RN: [birth year] (75 y.o. [name], [name] Primary Care Provider: Other Clinician: [name], [name] Referring Provider: Treating Provider/Extender: Stone III, Marya Lowden [name], [name] Weeks in Treatment: 219 Debridement Performed for Assessment: Wound #33 Right,Distal,Dorsal Foot Performed By: Physician Worthy Keeler, PA Debridement Type: Debridement Severity of Tissue Pre Debridement: Fat layer exposed Level of Consciousness (Pre-procedure): Awake and Alert Pre-procedure Verification/Time Out Yes - 13:52 Taken: Start Time: 13:52 Pain Control: Lidocaine T Area Debrided (L x W): otal 0.3 (cm) x 0.3 (cm) = 0.09 (cm) Tissue and other material debrided: Viable, Non-Viable, Slough, Subcutaneous, Skin: Dermis , Slough Level: Skin/Subcutaneous Tissue Debridement Description: Excisional Instrument: Curette Bleeding: Minimum Hemostasis Achieved: Pressure End Time: 13:54 Procedural Pain: 0 Post Procedural Pain: 0 Response to Treatment: Procedure was tolerated well Level of Consciousness (Post- Awake and Alert procedure): Post Debridement Measurements of Total Wound Length: (cm) 0.3 Width: (cm) 0.3 Depth: (cm) 0.1 Volume: (cm) 0.007 Character of Wound/Ulcer Post Debridement: Improved Severity of Tissue Post Debridement: Fat layer exposed Post Procedure Diagnosis Same as Pre-procedure Electronic Signature(s) Signed: 06/29/2020 8:28:17 AM By: Worthy Keeler PA-C Signed: 06/29/2020 5:36:09 PM By: [name] Entered By: [name] on 06/28/2020 17:57:30 -------------------------------------------------------------------------------- Debridement Details Patient Name: Date of Service: [name] 06/28/2020 12:30 PM Medical Record Number: [id number] Patient Account Number: [id number] Date of Birth/Sex: Treating RN: September 26, 1945 (75 y.o. [name] Primary Care Provider: [name], [name] Other Clinician: Referring Provider: Treating Provider/Extender: Stone III, Jonnae Fonseca [name], [name] Weeks in Treatment: 219 Debridement Performed for Assessment: Wound #34 Right,Medial,Dorsal Foot Performed By: Physician Worthy Keeler, PA Debridement Type: Debridement Severity  of Total Wound Length: (cm) 0.5 Width: (cm) 0.5 Depth: (cm) 0.1 Volume: (cm) 0.02 Character of Wound/Ulcer Post Debridement: Improved Severity of Tissue Post Debridement: Fat layer exposed Post Procedure Diagnosis Same as Pre-procedure Electronic Signature(s) Signed: 06/29/2020 8:28:17 AM By: Worthy Keeler PA-C Signed: 06/29/2020 5:36:09 PM By: [name] Entered By: [name] on 06/28/2020  17:55:18 -------------------------------------------------------------------------------- Debridement Details Patient Name: Date of Service: [name] 06/28/2020 12:30 PM Medical Record Number: [id number] Patient Account Number: [id number] Date of Birth/Sex: Treating RN: 06-17-1945 (75 y.o. [name], [name] Primary Care Provider: [name], [name] Other Clinician: Referring Provider: Treating Provider/Extender: Stone III, Gaelen Brager [name], [name] Weeks in Treatment: 219 Debridement Performed for Assessment: Wound #32 Left,Distal,Dorsal Foot Performed By: Physician Worthy Keeler, PA Debridement Type: Debridement Severity of Tissue Pre Debridement: Fat layer exposed Level of Consciousness (Pre-procedure): Awake and Alert Pre-procedure Verification/Time Out Yes - 13:50 Taken: Start Time: 13:50 Pain Control: Lidocaine T Area Debrided (L x W): otal 1.4 (cm) x 0.6 (cm) = 0.84 (cm) Tissue and other material debrided: Viable, Non-Viable, Slough, Subcutaneous, Skin: Dermis , Slough Level: Skin/Subcutaneous Tissue Debridement Description: Excisional Instrument: Curette Bleeding: Minimum Hemostasis Achieved: Pressure End Time: 13:51 Procedural Pain: 0 Post Procedural Pain: 0 Response to Treatment: Procedure was tolerated well Level of Consciousness (Post- Awake and Alert procedure): Post Debridement Measurements of Total Wound Length: (cm) 1.4 Width: (cm) 0.6 Depth: (cm) 0.2 Volume: (cm) 0.132 Character of Wound/Ulcer Post Debridement: Improved Severity of Tissue Post Debridement: Fat layer exposed Post Procedure Diagnosis Same as Pre-procedure Electronic Signature(s) Signed: 06/29/2020 8:28:17 AM By: Worthy Keeler PA-C Signed: 06/29/2020 5:36:09 PM By: [name] Entered By: [name] on 06/28/2020 17:56:29 -------------------------------------------------------------------------------- Debridement Details Patient Name: Date of Service: [name] 06/28/2020 12:30 PM Medical Record Number: [id number] Patient Account Number: [id number] Date of Birth/Sex: Treating RN: [birth year] (75 y.o. [name], [name] Primary Care Provider: Other Clinician: [name], [name] Referring Provider: Treating Provider/Extender: Stone III, Marya Lowden [name], [name] Weeks in Treatment: 219 Debridement Performed for Assessment: Wound #33 Right,Distal,Dorsal Foot Performed By: Physician Worthy Keeler, PA Debridement Type: Debridement Severity of Tissue Pre Debridement: Fat layer exposed Level of Consciousness (Pre-procedure): Awake and Alert Pre-procedure Verification/Time Out Yes - 13:52 Taken: Start Time: 13:52 Pain Control: Lidocaine T Area Debrided (L x W): otal 0.3 (cm) x 0.3 (cm) = 0.09 (cm) Tissue and other material debrided: Viable, Non-Viable, Slough, Subcutaneous, Skin: Dermis , Slough Level: Skin/Subcutaneous Tissue Debridement Description: Excisional Instrument: Curette Bleeding: Minimum Hemostasis Achieved: Pressure End Time: 13:54 Procedural Pain: 0 Post Procedural Pain: 0 Response to Treatment: Procedure was tolerated well Level of Consciousness (Post- Awake and Alert procedure): Post Debridement Measurements of Total Wound Length: (cm) 0.3 Width: (cm) 0.3 Depth: (cm) 0.1 Volume: (cm) 0.007 Character of Wound/Ulcer Post Debridement: Improved Severity of Tissue Post Debridement: Fat layer exposed Post Procedure Diagnosis Same as Pre-procedure Electronic Signature(s) Signed: 06/29/2020 8:28:17 AM By: Worthy Keeler PA-C Signed: 06/29/2020 5:36:09 PM By: [name] Entered By: [name] on 06/28/2020 17:57:30 -------------------------------------------------------------------------------- Debridement Details Patient Name: Date of Service: [name] 06/28/2020 12:30 PM Medical Record Number: [id number] Patient Account Number: [id number] Date of Birth/Sex: Treating RN: September 26, 1945 (75 y.o. [name] Primary Care Provider: [name], [name] Other Clinician: Referring Provider: Treating Provider/Extender: Stone III, Jonnae Fonseca [name], [name] Weeks in Treatment: 219 Debridement Performed for Assessment: Wound #34 Right,Medial,Dorsal Foot Performed By: Physician Worthy Keeler, PA Debridement Type: Debridement Severity  YAIDEN, FELBER ([id number]) Visit Report for 06/28/2020 Chief Complaint Document Details Patient Name: Date of Service: [name] [facility] F. 06/28/2020 12:30 PM Medical Record Number: [id number] Patient Account Number: [id number] Date of Birth/Sex: Treating RN: 06/07/1945 (75 y.o. [name], [name] Primary Care Provider: [name], [name] Other Clinician: Referring Provider: Treating Provider/Extender: Stone III, Shahed Yeoman [name], [name] Weeks in Treatment: [place] from: Patient Chief Complaint He is here in follow up evaluation for bilateral heel ulcers Electronic Signature(s) Signed: 06/28/2020 1:13:33 PM By: Worthy Keeler PA-C Entered By: Worthy Keeler on 06/28/2020 13:13:33 -------------------------------------------------------------------------------- Debridement Details Patient Name: Date of Service: [name] 06/28/2020 12:30 PM Medical Record Number: [id number] Patient Account Number: [id number] Date of Birth/Sex: Treating RN: 1945/10/17 (75 y.o. [name], [name] Primary Care Provider: [name], [name] Other Clinician: Referring Provider: Treating Provider/Extender: Stone III, Chyann Ambrocio [name], [name] Weeks in Treatment: 219 Debridement Performed for Assessment: Wound #14 Left,Lateral Lower Leg Performed By: Physician Worthy Keeler, PA Debridement Type: Debridement Severity of Tissue Pre Debridement: Fat layer exposed Level of Consciousness (Pre-procedure): Awake and Alert Pre-procedure Verification/Time Out Yes - 13:45 Taken: Start Time: 13:45 Pain Control: Lidocaine T Area Debrided (L x W): otal 10.5 (cm) x 1 (cm) = 10.5 (cm) Tissue and other material debrided: Viable, Non-Viable, Slough, Subcutaneous, Skin: Dermis , Slough Level: Skin/Subcutaneous Tissue Debridement Description: Excisional Instrument: Curette Bleeding: Minimum Hemostasis Achieved: Pressure End Time: 13:46 Procedural Pain: 0 Post Procedural Pain: 0 Response to Treatment:  Procedure was tolerated well Level of Consciousness (Post- Awake and Alert procedure): Post Debridement Measurements of Total Wound Length: (cm) 10.5 Width: (cm) 1 Depth: (cm) 0.3 Volume: (cm) 2.474 Character of Wound/Ulcer Post Debridement: Improved Severity of Tissue Post Debridement: Fat layer exposed Post Procedure Diagnosis Same as Pre-procedure Electronic Signature(s) Signed: 06/29/2020 8:28:17 AM By: Worthy Keeler PA-C Signed: 06/29/2020 5:36:09 PM By: [name] Entered By: [name] on 06/28/2020 17:51:51 -------------------------------------------------------------------------------- Debridement Details Patient Name: Date of Service: [name] 06/28/2020 12:30 PM Medical Record Number: [id number] Patient Account Number: [id number] Date of Birth/Sex: Treating RN: Jan 31, 1946 (75 y.o. [name], [name] Primary Care Provider: [name], [name] Other Clinician: Referring Provider: Treating Provider/Extender: Stone III, Jahree Dermody [name], [name] Weeks in Treatment: 219 Debridement Performed for Assessment: Wound #25 Right,Lateral Lower Leg Performed By: Physician Worthy Keeler, PA Debridement Type: Debridement Severity of Tissue Pre Debridement: Fat layer exposed Level of Consciousness (Pre-procedure): Awake and Alert Pre-procedure Verification/Time Out Yes - 13:46 Taken: Start Time: 13:46 Pain Control: Lidocaine T Area Debrided (L x W): otal 1.2 (cm) x 0.5 (cm) = 0.6 (cm) Tissue and other material debrided: Viable, Non-Viable, Slough, Subcutaneous, Skin: Dermis , Slough Level: Skin/Subcutaneous Tissue Debridement Description: Excisional Instrument: Curette Bleeding: Minimum Hemostasis Achieved: Pressure End Time: 13:47 Procedural Pain: 0 Post Procedural Pain: 0 Response to Treatment: Procedure was tolerated well Level of Consciousness (Post- Awake and Alert procedure): Post Debridement Measurements of Total Wound Length: (cm) 1.2 Width:  (cm) 0.5 Depth: (cm) 0.1 Volume: (cm) 0.047 Character of Wound/Ulcer Post Debridement: Improved Severity of Tissue Post Debridement: Fat layer exposed Post Procedure Diagnosis Same as Pre-procedure Electronic Signature(s) Signed: 06/29/2020 8:28:17 AM By: Worthy Keeler PA-C Signed: 06/29/2020 5:36:09 PM By: [name] Entered By: [name] on 06/28/2020 17:52:52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 1 x Per Week/15 Days Discharge Instructions: Apply Kerlix and Coban compression as directed. Compression Wrap: Coban Self-Adherent Wrap 4x5 (in/yd) (Generic) 1 x Per Week/15 Days Discharge Instructions: Apply over Kerlix as directed. 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) Electronic Signature(s) Signed: 06/29/2020 5:36:09 PM By:  [name] Signed: 06/29/2020 6:04:37 PM By: Worthy Keeler PA-C Previous Signature: 06/29/2020 8:28:17 AM Version By: Worthy Keeler PA-C Entered By: [name] on 06/29/2020 14:33:45 Prescription 06/28/2020 -------------------------------------------------------------------------------- Laverda Page PA Patient Name: Provider: June 16, 1945 [id number] Date of Birth: NPI#: [name] [id number] Sex: DEA #: [id number] Phone #: License #: [place] Patient Address: [address] [place], [place] [address] [place], [place] [address] [phone] Allergies Sulfa Provider's Orders MRI, lower extremity with and without contrast - ICD10: L97.524 - left foot R/T non healing foot ulcer Hand Signature: Date(s): Electronic Signature(s) Signed: 06/29/2020 5:36:09 PM By: [name] Signed: 06/29/2020 6:04:37 PM By: Worthy Keeler PA-C Entered By: [name] on 06/29/2020 14:33:49 -------------------------------------------------------------------------------- Problem List Details Patient Name: Date of Service: [name] 06/28/2020 12:30 PM Medical Record Number: [id number] Patient Account Number: [id number] Date of Birth/Sex: Treating RN: August 08, 1945 (75 y.o. [name], [name] Primary Care Provider: [name], [name] Other Clinician: Referring Provider: Treating Provider/Extender: Stone III, Tracy Gerken [name], [name] Weeks in Treatment: 219 Active Problems ICD-10 Encounter Code Description Active Date MDM Diagnosis E11.621 Type 2 diabetes mellitus with foot ulcer 04/17/2016 No Yes L89.613 Pressure ulcer of right heel, stage 3 04/17/2016 No Yes L97.518 Non-pressure chronic ulcer of other part of right foot with other specified 05/17/2020 No Yes severity L97.221 Non-pressure chronic ulcer of left calf limited to breakdown of skin 08/15/2017 No Yes L97.321 Non-pressure chronic  ulcer of left ankle limited to breakdown of skin 07/01/2018 No Yes L97.521 Non-pressure chronic ulcer of other part of left foot limited to breakdown of 02/17/2019 No Yes skin L97.524 Non-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 06/14/2020 No Yes Inactive Problems ICD-10 Code Description Active Date Inactive Date L03.032 Cellulitis of left toe 01/06/2019 01/06/2019 [id number] Pressure ulcer of left heel, stage 3 12/04/2016 12/04/2016 I25.119 Atherosclerotic heart disease of native coronary artery with unspecified angina 04/17/2016 04/17/2016 pectoris L97.821 Non-pressure chronic ulcer of other part of left lower leg limited to breakdown of skin 01/06/2019 01/06/2019 S51.811D Laceration without foreign body of right forearm, subsequent encounter 10/22/2017 10/22/2017 [id number] Acute diastolic (congestive) heart failure 04/17/2016 04/17/2016 L03.116 Cellulitis of left lower limb 12/24/2017 12/24/2017 L89.620 Pressure ulcer of left heel, unstageable 07/21/2019 07/21/2019 L97.211 Non-pressure chronic ulcer of right calf limited to breakdown of skin 07/21/2019 07/21/2019 S80.211D Abrasion, right knee, subsequent encounter 08/18/2019 08/18/2019 Resolved Problems ICD-10 Code Description Active Date Resolved Date L89.512 Pressure ulcer of right ankle, stage 2 04/17/2016 04/17/2016 L89.522 Pressure ulcer of left ankle, stage 2 04/17/2016 04/17/2016 Electronic Signature(s) Signed: 06/29/2020 8:28:17 AM By: Worthy Keeler PA-C Entered By: Worthy Keeler on 06/28/2020 13:10:21 -------------------------------------------------------------------------------- Progress Note  as instructed Secondary Dressing: Woven Gauze Sponge, Non-Sterile 4x4 in (Generic) 1 x Per Week/15 Days Discharge Instructions: Apply over primary dressing as directed. Secondary Dressing: ABD Pad, 5x9 1 x Per Week/15 Days Discharge Instructions: Apply over primary dressing as directed. Compression Wrap: Kerlix Roll 4.5x3.1 (in/yd) (Generic) 1 x Per Week/15 Days Discharge Instructions: Apply Kerlix and Coban compression as directed. Compression Wrap: Coban Self-Adherent Wrap 4x5 (in/yd) (Generic) 1 x Per Week/15 Days Discharge Instructions: Apply over Kerlix as directed. 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) Electronic Signature(s) Signed: 06/29/2020 5:36:09 PM By:  [name] Signed: 06/29/2020 6:04:37 PM By: Worthy Keeler PA-C Previous Signature: 06/29/2020 8:28:17 AM Version By: Worthy Keeler PA-C Entered By: [name] on 06/29/2020 14:33:45 Prescription 06/28/2020 -------------------------------------------------------------------------------- Laverda Page PA Patient Name: Provider: June 16, 1945 [id number] Date of Birth: NPI#: [name] [id number] Sex: DEA #: [id number] Phone #: License #: [place] Patient Address: [address] [place], [place] [address] [place], [place] [address] [phone] Allergies Sulfa Provider's Orders MRI, lower extremity with and without contrast - ICD10: L97.524 - left foot R/T non healing foot ulcer Hand Signature: Date(s): Electronic Signature(s) Signed: 06/29/2020 5:36:09 PM By: [name] Signed: 06/29/2020 6:04:37 PM By: Worthy Keeler PA-C Entered By: [name] on 06/29/2020 14:33:49 -------------------------------------------------------------------------------- Problem List Details Patient Name: Date of Service: [name] 06/28/2020 12:30 PM Medical Record Number: [id number] Patient Account Number: [id number] Date of Birth/Sex: Treating RN: August 08, 1945 (75 y.o. [name], [name] Primary Care Provider: [name], [name] Other Clinician: Referring Provider: Treating Provider/Extender: Stone III, Tracy Gerken [name], [name] Weeks in Treatment: 219 Active Problems ICD-10 Encounter Code Description Active Date MDM Diagnosis E11.621 Type 2 diabetes mellitus with foot ulcer 04/17/2016 No Yes L89.613 Pressure ulcer of right heel, stage 3 04/17/2016 No Yes L97.518 Non-pressure chronic ulcer of other part of right foot with other specified 05/17/2020 No Yes severity L97.221 Non-pressure chronic ulcer of left calf limited to breakdown of skin 08/15/2017 No Yes L97.321 Non-pressure chronic  ulcer of left ankle limited to breakdown of skin 07/01/2018 No Yes L97.521 Non-pressure chronic ulcer of other part of left foot limited to breakdown of 02/17/2019 No Yes skin L97.524 Non-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 06/14/2020 No Yes Inactive Problems ICD-10 Code Description Active Date Inactive Date L03.032 Cellulitis of left toe 01/06/2019 01/06/2019 [id number] Pressure ulcer of left heel, stage 3 12/04/2016 12/04/2016 I25.119 Atherosclerotic heart disease of native coronary artery with unspecified angina 04/17/2016 04/17/2016 pectoris L97.821 Non-pressure chronic ulcer of other part of left lower leg limited to breakdown of skin 01/06/2019 01/06/2019 S51.811D Laceration without foreign body of right forearm, subsequent encounter 10/22/2017 10/22/2017 [id number] Acute diastolic (congestive) heart failure 04/17/2016 04/17/2016 L03.116 Cellulitis of left lower limb 12/24/2017 12/24/2017 L89.620 Pressure ulcer of left heel, unstageable 07/21/2019 07/21/2019 L97.211 Non-pressure chronic ulcer of right calf limited to breakdown of skin 07/21/2019 07/21/2019 S80.211D Abrasion, right knee, subsequent encounter 08/18/2019 08/18/2019 Resolved Problems ICD-10 Code Description Active Date Resolved Date L89.512 Pressure ulcer of right ankle, stage 2 04/17/2016 04/17/2016 L89.522 Pressure ulcer of left ankle, stage 2 04/17/2016 04/17/2016 Electronic Signature(s) Signed: 06/29/2020 8:28:17 AM By: Worthy Keeler PA-C Entered By: Worthy Keeler on 06/28/2020 13:10:21 -------------------------------------------------------------------------------- Progress Note  of Total Wound Length: (cm) 0.5 Width: (cm) 0.5 Depth: (cm) 0.1 Volume: (cm) 0.02 Character of Wound/Ulcer Post Debridement: Improved Severity of Tissue Post Debridement: Fat layer exposed Post Procedure Diagnosis Same as Pre-procedure Electronic Signature(s) Signed: 06/29/2020 8:28:17 AM By: Worthy Keeler PA-C Signed: 06/29/2020 5:36:09 PM By: [name] Entered By: [name] on 06/28/2020  17:55:18 -------------------------------------------------------------------------------- Debridement Details Patient Name: Date of Service: [name] 06/28/2020 12:30 PM Medical Record Number: [id number] Patient Account Number: [id number] Date of Birth/Sex: Treating RN: 06-17-1945 (75 y.o. [name], [name] Primary Care Provider: [name], [name] Other Clinician: Referring Provider: Treating Provider/Extender: Stone III, Gaelen Brager [name], [name] Weeks in Treatment: 219 Debridement Performed for Assessment: Wound #32 Left,Distal,Dorsal Foot Performed By: Physician Worthy Keeler, PA Debridement Type: Debridement Severity of Tissue Pre Debridement: Fat layer exposed Level of Consciousness (Pre-procedure): Awake and Alert Pre-procedure Verification/Time Out Yes - 13:50 Taken: Start Time: 13:50 Pain Control: Lidocaine T Area Debrided (L x W): otal 1.4 (cm) x 0.6 (cm) = 0.84 (cm) Tissue and other material debrided: Viable, Non-Viable, Slough, Subcutaneous, Skin: Dermis , Slough Level: Skin/Subcutaneous Tissue Debridement Description: Excisional Instrument: Curette Bleeding: Minimum Hemostasis Achieved: Pressure End Time: 13:51 Procedural Pain: 0 Post Procedural Pain: 0 Response to Treatment: Procedure was tolerated well Level of Consciousness (Post- Awake and Alert procedure): Post Debridement Measurements of Total Wound Length: (cm) 1.4 Width: (cm) 0.6 Depth: (cm) 0.2 Volume: (cm) 0.132 Character of Wound/Ulcer Post Debridement: Improved Severity of Tissue Post Debridement: Fat layer exposed Post Procedure Diagnosis Same as Pre-procedure Electronic Signature(s) Signed: 06/29/2020 8:28:17 AM By: Worthy Keeler PA-C Signed: 06/29/2020 5:36:09 PM By: [name] Entered By: [name] on 06/28/2020 17:56:29 -------------------------------------------------------------------------------- Debridement Details Patient Name: Date of Service: [name] 06/28/2020 12:30 PM Medical Record Number: [id number] Patient Account Number: [id number] Date of Birth/Sex: Treating RN: [birth year] (75 y.o. [name], [name] Primary Care Provider: Other Clinician: [name], [name] Referring Provider: Treating Provider/Extender: Stone III, Marya Lowden [name], [name] Weeks in Treatment: 219 Debridement Performed for Assessment: Wound #33 Right,Distal,Dorsal Foot Performed By: Physician Worthy Keeler, PA Debridement Type: Debridement Severity of Tissue Pre Debridement: Fat layer exposed Level of Consciousness (Pre-procedure): Awake and Alert Pre-procedure Verification/Time Out Yes - 13:52 Taken: Start Time: 13:52 Pain Control: Lidocaine T Area Debrided (L x W): otal 0.3 (cm) x 0.3 (cm) = 0.09 (cm) Tissue and other material debrided: Viable, Non-Viable, Slough, Subcutaneous, Skin: Dermis , Slough Level: Skin/Subcutaneous Tissue Debridement Description: Excisional Instrument: Curette Bleeding: Minimum Hemostasis Achieved: Pressure End Time: 13:54 Procedural Pain: 0 Post Procedural Pain: 0 Response to Treatment: Procedure was tolerated well Level of Consciousness (Post- Awake and Alert procedure): Post Debridement Measurements of Total Wound Length: (cm) 0.3 Width: (cm) 0.3 Depth: (cm) 0.1 Volume: (cm) 0.007 Character of Wound/Ulcer Post Debridement: Improved Severity of Tissue Post Debridement: Fat layer exposed Post Procedure Diagnosis Same as Pre-procedure Electronic Signature(s) Signed: 06/29/2020 8:28:17 AM By: Worthy Keeler PA-C Signed: 06/29/2020 5:36:09 PM By: [name] Entered By: [name] on 06/28/2020 17:57:30 -------------------------------------------------------------------------------- Debridement Details Patient Name: Date of Service: [name] 06/28/2020 12:30 PM Medical Record Number: [id number] Patient Account Number: [id number] Date of Birth/Sex: Treating RN: September 26, 1945 (75 y.o. [name] Primary Care Provider: [name], [name] Other Clinician: Referring Provider: Treating Provider/Extender: Stone III, Jonnae Fonseca [name], [name] Weeks in Treatment: 219 Debridement Performed for Assessment: Wound #34 Right,Medial,Dorsal Foot Performed By: Physician Worthy Keeler, PA Debridement Type: Debridement Severity

## 2020-06-30 NOTE — Progress Notes (Signed)
Intact Exudate Amount: Medium Exudate Type: Serosanguineous Exudate Color: red, brown Foul Odor After Cleansing: No Slough/Fibrino Yes Wound Bed Granulation Amount: Small (1-33%) Exposed Structure Granulation Quality: Red, Pink Fascia Exposed: No Necrotic Amount:  Large (67-100%) Fat Layer (Subcutaneous Tissue) Exposed: Yes Necrotic Quality: Eschar, Adherent Slough Tendon Exposed: Yes Muscle Exposed: No Joint Exposed: No Bone Exposed: No Electronic Signature(s) Signed: 06/29/2020 5:36:09 PM By: [name] Signed: 06/30/2020 9:46:14 AM By: Sandre Kitty Previous Signature: 06/29/2020 4:22:28 PM Version By: Sandre Kitty Entered By: Sandre Kitty on 06/29/2020 16:59:13 -------------------------------------------------------------------------------- Wound Assessment Details Patient Name: Date of Service: [name] Foley F. 06/28/2020 12:30 PM Medical Record Number: [id number] Patient Account Number: [id number] Date of Birth/Sex: Treating RN: 07-09-1945 (75 y.o. [name], [name] Primary Care Mayme Profeta: [name], [name] Other Clinician: Referring Dominiqua Cooner: Treating Ricardo Schubach/Extender: Stone III, Hoyt [name], [name] Weeks in Treatment: 219 Wound Status Wound Number: 28 Primary Diabetic Wound/Ulcer of the Lower Extremity Etiology: Wound Location: Right, Dorsal Foot Wound Open Wounding Event: Gradually Appeared Status: Date Acquired: 04/26/2020 Comorbid Anemia, Sleep Apnea, Arrhythmia, Congestive Heart Failure, Weeks Of Treatment: 9 History: Coronary Artery Disease, Hypertension, Myocardial Infarction, Clustered Wound: Yes Type II Diabetes, Gout, Neuropathy Photos Wound Measurements Length: (cm) 0.3 Width: (cm) 0.3 Depth: (cm) 0.1 Clustered Quantity: 2 Area: (cm) 0.071 Volume: (cm) 0.007 % Reduction in Area: 93.2% % Reduction in Volume: 93.3% Epithelialization: Small (1-33%) Wound Description Classification: Grade 1 Wound Margin: Flat and Intact Exudate Amount: Small Exudate Type: Serosanguineous Exudate Color: red, brown Foul Odor After Cleansing: No Slough/Fibrino Yes Wound Bed Granulation Amount: Small (1-33%) Exposed Structure Granulation Quality: Pink Fascia Exposed: No Necrotic Amount: Large (67-100%) Fat  Layer (Subcutaneous Tissue) Exposed: Yes Necrotic Quality: Adherent Slough Tendon Exposed: No Muscle Exposed: No Joint Exposed: No Bone Exposed: No Electronic Signature(s) Signed: 06/29/2020 5:36:09 PM By: [name] Signed: 06/30/2020 9:46:14 AM By: Sandre Kitty Previous Signature: 06/29/2020 4:22:28 PM Version By: Sandre Kitty Entered By: Sandre Kitty on 06/29/2020 16:44:08 -------------------------------------------------------------------------------- Wound Assessment Details Patient Name: Date of Service: [name] Foley F. 06/28/2020 12:30 PM Medical Record Number: [id number] Patient Account Number: [id number] Date of Birth/Sex: Treating RN: 10-Oct-1945 (75 y.o. [name], [place] Primary Care Troy Kanouse: [name], [name] Other Clinician: Referring Sydney Hasten: Treating Keslie Gritz/Extender: Stone III, Hoyt [name], [name] Weeks in Treatment: 219 Wound Status Wound Number: 29 Primary Diabetic Wound/Ulcer of the Lower Extremity Etiology: Wound Location: Right Calcaneus Wound Open Wounding Event: Gradually Appeared Status: Date Acquired: 04/26/2020 Comorbid Anemia, Sleep Apnea, Arrhythmia, Congestive Heart Failure, Weeks Of Treatment: 9 History: Coronary Artery Disease, Hypertension, Myocardial Infarction, Clustered Wound: No Type II Diabetes, Gout, Neuropathy Photos Wound Measurements Length: (cm) 0.5 Width: (cm) 0.5 Depth: (cm) 0.1 Area: (cm) 0.196 Volume: (cm) 0.02 % Reduction in Area: 89.2% % Reduction in Volume: 89% Epithelialization: Medium (34-66%) Wound Description Classification: Grade 1 Wound Margin: Flat and Intact Exudate Amount: Small Exudate Type: Serosanguineous Exudate Color: red, brown Foul Odor After Cleansing: No Slough/Fibrino No Wound Bed Granulation Amount: Large (67-100%) Exposed Structure Granulation Quality: Pink Fascia Exposed: No Necrotic Amount: None Present (0%) Fat Layer (Subcutaneous Tissue) Exposed: Yes Tendon  Exposed: No Muscle Exposed: No Joint Exposed: No Bone Exposed: No Electronic Signature(s) Signed: 06/29/2020 5:36:09 PM By: [name] Signed: 06/30/2020 9:46:14 AM By: Sandre Kitty Previous Signature: 06/29/2020 4:22:28 PM Version By: Sandre Kitty Entered By: Sandre Kitty on 06/29/2020 16:57:37 -------------------------------------------------------------------------------- Wound Assessment Details Patient Name: Date of Service: [name] Foley F. 06/28/2020 12:30 PM Medical Record Number: [id number]  06/29/2020 4:22:28 PM Version By: Sandre Kitty Entered By: Sandre Kitty on 06/29/2020 16:46:16 -------------------------------------------------------------------------------- Wound Assessment Details Patient Name: Date of Service: [name] [name] 06/28/2020 12:30 PM Medical Record Number: [id number] Patient Account Number: [id number] Date of Birth/Sex: Treating RN: 02-01-1946 (75 y.o. [name], [name] Primary Care Ezrah Dembeck: [name], [name] Other Clinician: Referring Erina Hamme: Treating Dulse Rutan/Extender: Stone III, Hoyt [name], [name] Weeks in Treatment: 219 Wound Status Wound Number: 35 Primary Diabetic Wound/Ulcer of the Lower  Extremity Etiology: Wound Location: Left, Lateral, Dorsal Foot Wound Open Wounding Event: Gradually Appeared Status: Date Acquired: 05/24/2020 Comorbid Anemia, Sleep Apnea, Arrhythmia, Congestive Heart Failure, Weeks Of Treatment: 2 History: Coronary Artery Disease, Hypertension, Myocardial Infarction, Clustered Wound: No Type II Diabetes, Gout, Neuropathy Photos Wound Measurements Length: (cm) 0.7 Width: (cm) 0.5 Depth: (cm) 0.4 Area: (cm) 0.275 Volume: (cm) 0.11 % Reduction in Area: 41.6% % Reduction in Volume: 53.4% Epithelialization: Small (1-33%) Wound Description Classification: Grade 2 Wound Margin: Well defined, not attached Exudate Amount: Medium Exudate Type: Purulent Exudate Color: yellow, brown, green Foul Odor After Cleansing: No Slough/Fibrino Yes Wound Bed Granulation Amount: Medium (34-66%) Exposed Structure Granulation Quality: Red, Hyper-granulation, Friable Fascia Exposed: No Necrotic Amount: Medium (34-66%) Fat Layer (Subcutaneous Tissue) Exposed: Yes Necrotic Quality: Adherent Slough Tendon Exposed: No Muscle Exposed: No Joint Exposed: No Bone Exposed: Yes Electronic Signature(s) Signed: 06/29/2020 5:36:09 PM By: [name] Signed: 06/30/2020 9:46:14 AM By: Sandre Kitty Previous Signature: 06/29/2020 4:22:28 PM Version By: Sandre Kitty Entered By: Sandre Kitty on 06/29/2020 16:58:48 -------------------------------------------------------------------------------- Vitals Details Patient Name: Date of Service: [name] Foley F. 06/28/2020 12:30 PM Medical Record Number: [id number] Patient Account Number: [id number] Date of Birth/Sex: Treating RN: 1945/09/21 (75 y.o. [name], [name] Primary Care Shaleka Brines: [name], [name] Other Clinician: Referring Ariz Terrones: Treating Sundiata Ferrick/Extender: Stone III, Hoyt [name], [name] Weeks in Treatment: 219 Vital Signs Time Taken: 12:43 Temperature (F): 97.6 Height (in): 74 Pulse  (bpm): 71 Weight (lbs): 150 Respiratory Rate (breaths/min): 18 Body Mass Index (BMI): 19.3 Blood Pressure (mmHg): 136/78 Capillary Blood Glucose (mg/dl): 112 Reference Range: 80 - 120 mg / dl Electronic Signature(s) Signed: 06/29/2020 4:22:28 PM By: Sandre Kitty Entered By: Sandre Kitty on 06/28/2020 12:43:33  Patient Account Number: [id number] Date of Birth/Sex: Treating RN: 1945-06-11 (75 y.o. [name], [name] Primary Care Mervyn Pflaum: [name], [name] Other Clinician: Referring Jozee Hammer: Treating Genesi Stefanko/Extender: Stone III, Hoyt [name], [name] Weeks in Treatment: 219 Wound Status Wound Number: 32 Primary Diabetic Wound/Ulcer of the Lower Extremity Etiology: Wound Location: Left, Distal, Dorsal Foot Wound Open Wounding Event: Gradually Appeared Status: Date Acquired: 05/03/2020 Comorbid Anemia, Sleep Apnea, Arrhythmia, Congestive Heart Failure, Weeks Of Treatment: 8 History: Coronary Artery Disease, Hypertension, Myocardial Infarction, Clustered Wound: No Type II Diabetes, Gout, Neuropathy Photos Wound Measurements Length: (cm) 1.4 Width: (cm) 0.6 Depth: (cm) 0.2 Area: (cm) 0.66 Volume: (cm) 0.132 % Reduction in Area: 62% % Reduction in Volume: 24.1% Epithelialization: Small (1-33%) Wound Description Classification: Grade 2 Wound Margin: Flat and Intact Exudate Amount: Medium Exudate Type: Serosanguineous Exudate Color: red, brown Foul Odor After Cleansing: No Slough/Fibrino Yes Wound Bed Granulation Amount: Small (1-33%) Exposed Structure Granulation Quality: Pink Fascia Exposed: No Necrotic Amount: Large (67-100%) Fat Layer (Subcutaneous Tissue) Exposed: Yes Necrotic Quality: Adherent Slough Tendon Exposed: No Muscle Exposed: No Joint Exposed:  No Bone Exposed: No Electronic Signature(s) Signed: 06/29/2020 5:36:09 PM By: [name] Signed: 06/30/2020 9:46:14 AM By: Sandre Kitty Previous Signature: 06/29/2020 4:22:28 PM Version By: Sandre Kitty Entered By: Sandre Kitty on 06/29/2020 16:58:21 -------------------------------------------------------------------------------- Wound Assessment Details Patient Name: Date of Service: [name] Foley F. 06/28/2020 12:30 PM Medical Record Number: [id number] Patient Account Number: [id number] Date of Birth/Sex: Treating RN: [birth year] (75 y.o. [name], [name] Primary Care Tigran Haynie: [name], [name] Other Clinician: Referring Chaela Branscum: Treating Eyva Califano/Extender: Stone III, Hoyt [name], [name] Weeks in Treatment: 219 Wound Status Wound Number: 33 Primary Etiology: Diabetic Wound/Ulcer of the Lower Extremity Wound Location: Right, Distal, Dorsal Foot Wound Status: Open Wounding Event: Gradually Appeared Date Acquired: 05/17/2020 Weeks Of Treatment: 6 Clustered Wound: No Wound Measurements Length: (cm) 0.3 Width: (cm) 0.3 Depth: (cm) 0.1 Area: (cm) 0.071 Volume: (cm) 0.007 % Reduction in Area: 43.7% % Reduction in Volume: 46.2% Wound Description Classification: Grade 1 Electronic Signature(s) Signed: 06/29/2020 4:22:28 PM By: Sandre Kitty Signed: 06/29/2020 5:36:09 PM By: [name] Entered By: Sandre Kitty on 06/28/2020 13:11:10 -------------------------------------------------------------------------------- Wound Assessment Details Patient Name: Date of Service: [name] Foley F. 06/28/2020 12:30 PM Medical Record Number: [id number] Patient Account Number: [id number] Date of Birth/Sex: Treating RN: 1945-12-02 (75 y.o. [name], [name] Primary Care Zella Dewan: [name], [name] Other Clinician: Referring Ivan Maskell: Treating Laresa Oshiro/Extender: Stone III, Hoyt [name], [name] Weeks in Treatment: 219 Wound Status Wound Number: 34 Primary  Diabetic Wound/Ulcer of the Lower Extremity Etiology: Wound Location: Right, Medial, Dorsal Foot Wound Open Wounding Event: Gradually Appeared Status: Date Acquired: 06/14/2020 Comorbid Anemia, Sleep Apnea, Arrhythmia, Congestive Heart Failure, Weeks Of Treatment: 2 History: Coronary Artery Disease, Hypertension, Myocardial Infarction, Clustered Wound: No Type II Diabetes, Gout, Neuropathy Photos Wound Measurements Length: (cm) 0.1 Width: (cm) 0.1 Depth: (cm) 0.1 Area: (cm) 0.008 Volume: (cm) 0.001 % Reduction in Area: 97.1% % Reduction in Volume: 96.3% Epithelialization: Small (1-33%) Wound Description Classification: Grade 2 Wound Margin: Flat and Intact Exudate Amount: Small Exudate Type: Serosanguineous Exudate Color: red, brown Foul Odor After Cleansing: No Slough/Fibrino Yes Wound Bed Granulation Amount: None Present (0%) Exposed Structure Necrotic Amount: Large (67-100%) Fascia Exposed: No Necrotic Quality: Eschar, Adherent Slough Fat Layer (Subcutaneous Tissue) Exposed: Yes Tendon Exposed: No Muscle Exposed: No Joint Exposed: No Bone Exposed: No Electronic Signature(s) Signed: 06/29/2020 5:36:09 PM By: [name] Signed: 06/30/2020 9:46:14 AM By: Sandre Kitty Previous Signature:  06/29/2020 4:22:28 PM Version By: Sandre Kitty Entered By: Sandre Kitty on 06/29/2020 16:46:16 -------------------------------------------------------------------------------- Wound Assessment Details Patient Name: Date of Service: [name] [name] 06/28/2020 12:30 PM Medical Record Number: [id number] Patient Account Number: [id number] Date of Birth/Sex: Treating RN: 02-01-1946 (75 y.o. [name], [name] Primary Care Ezrah Dembeck: [name], [name] Other Clinician: Referring Erina Hamme: Treating Dulse Rutan/Extender: Stone III, Hoyt [name], [name] Weeks in Treatment: 219 Wound Status Wound Number: 35 Primary Diabetic Wound/Ulcer of the Lower  Extremity Etiology: Wound Location: Left, Lateral, Dorsal Foot Wound Open Wounding Event: Gradually Appeared Status: Date Acquired: 05/24/2020 Comorbid Anemia, Sleep Apnea, Arrhythmia, Congestive Heart Failure, Weeks Of Treatment: 2 History: Coronary Artery Disease, Hypertension, Myocardial Infarction, Clustered Wound: No Type II Diabetes, Gout, Neuropathy Photos Wound Measurements Length: (cm) 0.7 Width: (cm) 0.5 Depth: (cm) 0.4 Area: (cm) 0.275 Volume: (cm) 0.11 % Reduction in Area: 41.6% % Reduction in Volume: 53.4% Epithelialization: Small (1-33%) Wound Description Classification: Grade 2 Wound Margin: Well defined, not attached Exudate Amount: Medium Exudate Type: Purulent Exudate Color: yellow, brown, green Foul Odor After Cleansing: No Slough/Fibrino Yes Wound Bed Granulation Amount: Medium (34-66%) Exposed Structure Granulation Quality: Red, Hyper-granulation, Friable Fascia Exposed: No Necrotic Amount: Medium (34-66%) Fat Layer (Subcutaneous Tissue) Exposed: Yes Necrotic Quality: Adherent Slough Tendon Exposed: No Muscle Exposed: No Joint Exposed: No Bone Exposed: Yes Electronic Signature(s) Signed: 06/29/2020 5:36:09 PM By: [name] Signed: 06/30/2020 9:46:14 AM By: Sandre Kitty Previous Signature: 06/29/2020 4:22:28 PM Version By: Sandre Kitty Entered By: Sandre Kitty on 06/29/2020 16:58:48 -------------------------------------------------------------------------------- Vitals Details Patient Name: Date of Service: [name] Foley F. 06/28/2020 12:30 PM Medical Record Number: [id number] Patient Account Number: [id number] Date of Birth/Sex: Treating RN: 1945/09/21 (75 y.o. [name], [name] Primary Care Shaleka Brines: [name], [name] Other Clinician: Referring Ariz Terrones: Treating Sundiata Ferrick/Extender: Stone III, Hoyt [name], [name] Weeks in Treatment: 219 Vital Signs Time Taken: 12:43 Temperature (F): 97.6 Height (in): 74 Pulse  (bpm): 71 Weight (lbs): 150 Respiratory Rate (breaths/min): 18 Body Mass Index (BMI): 19.3 Blood Pressure (mmHg): 136/78 Capillary Blood Glucose (mg/dl): 112 Reference Range: 80 - 120 mg / dl Electronic Signature(s) Signed: 06/29/2020 4:22:28 PM By: Sandre Kitty Entered By: Sandre Kitty on 06/28/2020 12:43:33  06/29/2020 4:22:28 PM Version By: Sandre Kitty Entered By: Sandre Kitty on 06/29/2020 16:46:16 -------------------------------------------------------------------------------- Wound Assessment Details Patient Name: Date of Service: [name] [name] 06/28/2020 12:30 PM Medical Record Number: [id number] Patient Account Number: [id number] Date of Birth/Sex: Treating RN: 02-01-1946 (75 y.o. [name], [name] Primary Care Ezrah Dembeck: [name], [name] Other Clinician: Referring Erina Hamme: Treating Dulse Rutan/Extender: Stone III, Hoyt [name], [name] Weeks in Treatment: 219 Wound Status Wound Number: 35 Primary Diabetic Wound/Ulcer of the Lower  Extremity Etiology: Wound Location: Left, Lateral, Dorsal Foot Wound Open Wounding Event: Gradually Appeared Status: Date Acquired: 05/24/2020 Comorbid Anemia, Sleep Apnea, Arrhythmia, Congestive Heart Failure, Weeks Of Treatment: 2 History: Coronary Artery Disease, Hypertension, Myocardial Infarction, Clustered Wound: No Type II Diabetes, Gout, Neuropathy Photos Wound Measurements Length: (cm) 0.7 Width: (cm) 0.5 Depth: (cm) 0.4 Area: (cm) 0.275 Volume: (cm) 0.11 % Reduction in Area: 41.6% % Reduction in Volume: 53.4% Epithelialization: Small (1-33%) Wound Description Classification: Grade 2 Wound Margin: Well defined, not attached Exudate Amount: Medium Exudate Type: Purulent Exudate Color: yellow, brown, green Foul Odor After Cleansing: No Slough/Fibrino Yes Wound Bed Granulation Amount: Medium (34-66%) Exposed Structure Granulation Quality: Red, Hyper-granulation, Friable Fascia Exposed: No Necrotic Amount: Medium (34-66%) Fat Layer (Subcutaneous Tissue) Exposed: Yes Necrotic Quality: Adherent Slough Tendon Exposed: No Muscle Exposed: No Joint Exposed: No Bone Exposed: Yes Electronic Signature(s) Signed: 06/29/2020 5:36:09 PM By: [name] Signed: 06/30/2020 9:46:14 AM By: Sandre Kitty Previous Signature: 06/29/2020 4:22:28 PM Version By: Sandre Kitty Entered By: Sandre Kitty on 06/29/2020 16:58:48 -------------------------------------------------------------------------------- Vitals Details Patient Name: Date of Service: [name] Foley F. 06/28/2020 12:30 PM Medical Record Number: [id number] Patient Account Number: [id number] Date of Birth/Sex: Treating RN: 1945/09/21 (75 y.o. [name], [name] Primary Care Shaleka Brines: [name], [name] Other Clinician: Referring Ariz Terrones: Treating Sundiata Ferrick/Extender: Stone III, Hoyt [name], [name] Weeks in Treatment: 219 Vital Signs Time Taken: 12:43 Temperature (F): 97.6 Height (in): 74 Pulse  (bpm): 71 Weight (lbs): 150 Respiratory Rate (breaths/min): 18 Body Mass Index (BMI): 19.3 Blood Pressure (mmHg): 136/78 Capillary Blood Glucose (mg/dl): 112 Reference Range: 80 - 120 mg / dl Electronic Signature(s) Signed: 06/29/2020 4:22:28 PM By: Sandre Kitty Entered By: Sandre Kitty on 06/28/2020 12:43:33

## 2020-07-01 ENCOUNTER — Other Ambulatory Visit: Payer: Self-pay | Admitting: Physician Assistant

## 2020-07-01 ENCOUNTER — Other Ambulatory Visit (HOSPITAL_COMMUNITY): Payer: Self-pay | Admitting: Physician Assistant

## 2020-07-01 DIAGNOSIS — L97524 Non-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: Secondary | ICD-10-CM

## 2020-07-05 ENCOUNTER — Encounter (HOSPITAL_BASED_OUTPATIENT_CLINIC_OR_DEPARTMENT_OTHER): Payer: Medicare HMO | Admitting: Internal Medicine

## 2020-07-05 ENCOUNTER — Other Ambulatory Visit: Payer: Self-pay

## 2020-07-05 DIAGNOSIS — L97221 Non-pressure chronic ulcer of left calf limited to breakdown of skin: Secondary | ICD-10-CM | POA: Diagnosis not present

## 2020-07-05 DIAGNOSIS — E11621 Type 2 diabetes mellitus with foot ulcer: Secondary | ICD-10-CM | POA: Diagnosis not present

## 2020-07-05 DIAGNOSIS — L89613 Pressure ulcer of right heel, stage 3: Secondary | ICD-10-CM | POA: Diagnosis not present

## 2020-07-05 DIAGNOSIS — L97811 Non-pressure chronic ulcer of other part of right lower leg limited to breakdown of skin: Secondary | ICD-10-CM | POA: Diagnosis not present

## 2020-07-05 DIAGNOSIS — L97321 Non-pressure chronic ulcer of left ankle limited to breakdown of skin: Secondary | ICD-10-CM | POA: Diagnosis not present

## 2020-07-05 DIAGNOSIS — E1122 Type 2 diabetes mellitus with diabetic chronic kidney disease: Secondary | ICD-10-CM | POA: Diagnosis not present

## 2020-07-05 DIAGNOSIS — N312 Flaccid neuropathic bladder, not elsewhere classified: Secondary | ICD-10-CM | POA: Diagnosis not present

## 2020-07-05 DIAGNOSIS — L97822 Non-pressure chronic ulcer of other part of left lower leg with fat layer exposed: Secondary | ICD-10-CM | POA: Diagnosis not present

## 2020-07-05 DIAGNOSIS — E11622 Type 2 diabetes mellitus with other skin ulcer: Secondary | ICD-10-CM | POA: Diagnosis not present

## 2020-07-05 DIAGNOSIS — L97511 Non-pressure chronic ulcer of other part of right foot limited to breakdown of skin: Secondary | ICD-10-CM | POA: Diagnosis not present

## 2020-07-05 DIAGNOSIS — L97524 Non-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: Secondary | ICD-10-CM | POA: Diagnosis not present

## 2020-07-06 ENCOUNTER — Other Ambulatory Visit: Payer: Self-pay | Admitting: Family Medicine

## 2020-07-06 NOTE — Progress Notes (Signed)
Ag Gelling Fiber Dressing, 2x2 in (silver alginate) Discharge Instruction: Apply silver alginate to wound bed as instructed Secondary Dressing Woven Gauze Sponge, Non-Sterile 4x4 in Discharge Instruction: Apply over primary dressing as directed. ABD Pad, 5x9 Discharge Instruction: Apply over primary dressing as directed. Secured With Compression Wrap Kerlix Roll 4.5x3.1 (in/yd) Discharge Instruction: Apply Kerlix and Coban compression as directed. Coban Self-Adherent Wrap 4x5 (in/yd) Discharge Instruction: Apply over Kerlix as directed. Compression Stockings Add-Ons Electronic Signature(s) Signed: 07/05/2020 5:27:17 PM By: Sandre Kitty Signed: 07/05/2020 5:54:58 PM By: Baruch Gouty RN, BSN Entered By: Sandre Kitty on 07/05/2020  17:19:29 -------------------------------------------------------------------------------- Wound Assessment Details Patient Name: Date of Service: [name] Foley F. 07/05/2020 10:00 A M Medical Record Number: [id number] Patient Account Number: [id number] Date of Birth/Sex: Treating RN: October 01, 1945 (75 y.o. [name] [name] Primary Care Brentley Landfair: [name], [name] Other Clinician: Referring Ilda Laskin: Treating Langdon Crosson/Extender: [name], [name] [name], [name] Weeks in Treatment: 220 Wound Status Wound Number: 34 Primary Diabetic Wound/Ulcer of the Lower Extremity Etiology: Wound Location: Right, Medial, Dorsal Foot Wound Open Wounding Event: Gradually Appeared Status: Date Acquired: 06/14/2020 Comorbid Anemia, Sleep Apnea, Arrhythmia, Congestive Heart Failure, Weeks Of Treatment: 3 History: Coronary Artery Disease, Hypertension, Myocardial Infarction, Clustered Wound: No Type II Diabetes, Gout, Neuropathy Photos Wound Measurements Length: (cm) 1 Width: (cm) 1 Depth: (cm) 0.2 Area: (cm) 0.785 Volume: (cm) 0.157 % Reduction in Area: -185.5% % Reduction in Volume: -481.5% Epithelialization: Small (1-33%) Tunneling: No Undermining: No Wound Description Classification: Grade 2 Wound Margin: Flat and Intact Exudate Amount: Medium Exudate Type: Serosanguineous Exudate Color: red, brown Foul Odor After Cleansing: No Slough/Fibrino Yes Wound Bed Granulation Amount: Large (67-100%) Exposed Structure Granulation Quality: Red Fascia Exposed: No Necrotic Amount: Small (1-33%) Fat Layer (Subcutaneous Tissue) Exposed: Yes Necrotic Quality: Adherent Slough Tendon Exposed: No Muscle Exposed: No Joint Exposed: No Bone Exposed: No Treatment Notes Wound #34 (Foot) Wound Laterality: Dorsal, Right, Medial Cleanser Wound Cleanser Discharge Instruction: Cleanse the wound with wound cleanser prior to applying a clean dressing using gauze sponges, not tissue or cotton balls. Soap and  Water Discharge Instruction: May shower and wash wound with dial antibacterial soap and water prior to dressing change. Peri-Wound Care Sween Lotion (Moisturizing lotion) Discharge Instruction: Apply moisturizing lotion as directed Topical Primary Dressing KerraCel Ag Gelling Fiber Dressing, 4x5 in (silver alginate) Discharge Instruction: Apply silver alginate to wound bed as instructed Secondary Dressing Woven Gauze Sponge, Non-Sterile 4x4 in Discharge Instruction: Apply over primary dressing as directed. ABD Pad, 5x9 Discharge Instruction: Apply over primary dressing as directed. Secured With Compression Wrap Kerlix Roll 4.5x3.1 (in/yd) Discharge Instruction: Apply Kerlix and Coban compression as directed. Coban Self-Adherent Wrap 4x5 (in/yd) Discharge Instruction: Apply over Kerlix as directed. Compression Stockings Add-Ons Electronic Signature(s) Signed: 07/05/2020 5:27:17 PM By: Sandre Kitty Signed: 07/05/2020 5:54:58 PM By: Baruch Gouty RN, BSN Entered By: Sandre Kitty on 07/05/2020 17:18:53 -------------------------------------------------------------------------------- Wound Assessment Details Patient Name: Date of Service: [name] Foley F. 07/05/2020 10:00 A M Medical Record Number: [id number] Patient Account Number: [id number] Date of Birth/Sex: Treating RN: [birth year] (75 y.o. [name] [name] Primary Care Treasa Bradshaw: [name], [name] Other Clinician: Referring Irmalee Riemenschneider: Treating Denzal Meir/Extender: [name], [name] [name], [name] Weeks in Treatment: 220 Wound Status Wound Number: 35 Primary Diabetic Wound/Ulcer of the Lower Extremity Etiology: Wound Location: Left, Lateral, Dorsal Foot Wound Open Wounding Event: Gradually Appeared Status: Date Acquired: 05/24/2020 Comorbid Anemia, Sleep Apnea, Arrhythmia, Congestive Heart Failure, Weeks Of Treatment: 3 History: Coronary Artery Disease, Hypertension, Myocardial Infarction, Clustered Wound: No Type  F. 07/05/2020 10:00 A M Medical Record Number: [id number] Patient Account Number: [id number] Date of Birth/Sex: Treating RN: [birth year] (75 y.o. [name] [name] Primary Care Kamica Florance: [name], [name] Other Clinician: Referring Shyrl Obi: Treating Sabra Sessler/Extender: [name], [name] [name], [name] Weeks in Treatment: 220 Wound Status Wound Number: 26 Primary Diabetic Wound/Ulcer of the Lower Extremity Etiology: Wound Location: Left, Dorsal Foot Wound Open Wounding Event: Gradually Appeared Status: Date Acquired: 08/18/2019 Comorbid Anemia, Sleep Apnea, Arrhythmia, Congestive Heart Failure, Weeks Of Treatment: 46 History: Coronary Artery Disease, Hypertension, Myocardial Infarction, Clustered Wound: Yes Type II Diabetes, Gout, Neuropathy Photos Wound Measurements Length: (cm) 3.3 Width: (cm) 1.3 Depth: (cm) 0.1 Clustered Quantity: 4 Area: (cm) 3.369 Volume: (cm) 0.337 % Reduction in Area: -512.5% % Reduction in Volume: -512.7% Epithelialization:  None Tunneling: No Undermining: No Wound Description Classification: Grade 2 Wound Margin: Flat and Intact Exudate Amount: Medium Exudate Type: Purulent Exudate Color: yellow, brown, green Foul Odor After Cleansing: No Slough/Fibrino Yes Wound Bed Granulation Amount: Small (1-33%) Exposed Structure Granulation Quality: Red, Pink Fascia Exposed: No Necrotic Amount: Large (67-100%) Fat Layer (Subcutaneous Tissue) Exposed: Yes Necrotic Quality: Eschar, Adherent Slough Tendon Exposed: Yes Muscle Exposed: No Joint Exposed: No Bone Exposed: No Treatment Notes Wound #26 (Foot) Wound Laterality: Dorsal, Left Cleanser Wound Cleanser Discharge Instruction: Cleanse the wound with wound cleanser prior to applying a clean dressing using gauze sponges, not tissue or cotton balls. Soap and Water Discharge Instruction: May shower and wash wound with dial antibacterial soap and water prior to dressing change. Peri-Wound Care Sween Lotion (Moisturizing lotion) Discharge Instruction: Apply moisturizing lotion as directed Topical Primary Dressing KerraCel Ag Gelling Fiber Dressing, 4x5 in (silver alginate) Discharge Instruction: Apply silver alginate to wound bed as instructed Secondary Dressing Woven Gauze Sponge, Non-Sterile 4x4 in Discharge Instruction: Apply over primary dressing as directed. ABD Pad, 5x9 Discharge Instruction: Apply over primary dressing as directed. Secured With Compression Wrap Kerlix Roll 4.5x3.1 (in/yd) Discharge Instruction: Apply Kerlix and Coban compression as directed. Coban Self-Adherent Wrap 4x5 (in/yd) Discharge Instruction: Apply over Kerlix as directed. Compression Stockings Add-Ons Electronic Signature(s) Signed: 07/05/2020 5:27:17 PM By: Sandre Kitty Signed: 07/05/2020 5:54:58 PM By: Baruch Gouty RN, BSN Entered By: Sandre Kitty on 07/05/2020 17:21:02 -------------------------------------------------------------------------------- Wound  Assessment Details Patient Name: Date of Service: [name] Foley F. 07/05/2020 10:00 A M Medical Record Number: [id number] Patient Account Number: [id number] Date of Birth/Sex: Treating RN: 1945/06/10 (75 y.o. [name] [name] Primary Care Malikye Reppond: [name], [name] Other Clinician: Referring Shanise Balch: Treating Samual Beals/Extender: [name], [name] [name], [name] Weeks in Treatment: 220 Wound Status Wound Number: 28 Primary Diabetic Wound/Ulcer of the Lower Extremity Etiology: Wound Location: Right, Dorsal Foot Wound Open Wounding Event: Gradually Appeared Status: Date Acquired: 04/26/2020 Comorbid Anemia, Sleep Apnea, Arrhythmia, Congestive Heart Failure, Weeks Of Treatment: 10 History: Coronary Artery Disease, Hypertension, Myocardial Infarction, Clustered Wound: Yes Type II Diabetes, Gout, Neuropathy Photos Wound Measurements Length: (cm) 0.5 Width: (cm) 0.6 Depth: (cm) 0.1 Clustered Quantity: 2 Area: (cm) 0.236 Volume: (cm) 0.024 % Reduction in Area: 77.2% % Reduction in Volume: 76.9% Epithelialization: Medium (34-66%) Tunneling: No Undermining: No Wound Description Classification: Grade 1 Wound Margin: Flat and Intact Exudate Amount: Small Exudate Type: Serosanguineous Exudate Color: red, brown Foul Odor After Cleansing: No Slough/Fibrino Yes Wound Bed Granulation Amount: Medium (34-66%) Exposed Structure Granulation Quality: Pink Fascia Exposed: No Necrotic Amount: Medium (34-66%) Fat Layer (Subcutaneous Tissue) Exposed: Yes Necrotic Quality: Adherent Slough Tendon Exposed: No Muscle Exposed: No Joint Exposed: No Bone Exposed: No Treatment Notes Wound #28 (  Layer (Subcutaneous Tissue) Exposed: Yes Necrotic Quality: Eschar, Adherent Slough Tendon Exposed: No Muscle Exposed: No Joint Exposed: No Bone Exposed: No Treatment Notes Wound #14 (Lower Leg) Wound Laterality: Left, Lateral Cleanser Wound Cleanser Discharge Instruction: Cleanse the wound with wound cleanser prior to applying a clean dressing using gauze sponges, not tissue or cotton balls. Soap and Water Discharge Instruction: May shower and wash wound with dial antibacterial soap and water prior to dressing change. Peri-Wound Care Sween Lotion (Moisturizing lotion) Discharge Instruction: Apply moisturizing lotion as directed Topical Primary Dressing KerraCel Ag Gelling Fiber Dressing, 4x5 in (silver alginate) Discharge Instruction: Apply silver alginate to wound bed as instructed Secondary Dressing Woven Gauze Sponge, Non-Sterile 4x4 in Discharge Instruction: Apply over primary dressing as directed. ABD Pad, 5x9 Discharge Instruction: Apply over primary dressing as directed. Secured With Compression Wrap Kerlix Roll 4.5x3.1 (in/yd) Discharge Instruction: Apply Kerlix and Coban compression as directed. Coban Self-Adherent Wrap 4x5 (in/yd) Discharge Instruction: Apply over Kerlix as directed. Compression Stockings Add-Ons Electronic Signature(s) Signed: 07/05/2020 5:27:17 PM By: Sandre Kitty Signed: 07/05/2020 5:54:58 PM By: Baruch Gouty RN, BSN Entered By: Sandre Kitty on 07/05/2020 17:20:35 -------------------------------------------------------------------------------- Wound Assessment Details Patient Name: Date of Service: [name] Foley F. 07/05/2020 10:00 A M Medical Record Number: [id number] Patient Account Number: [id number] Date of  Birth/Sex: Treating RN: 03-Feb-1946 (75 y.o. [name] [name] Primary Care Roslind Michaux: [name], [name] Other Clinician: Referring Britzy Graul: Treating Kwali Wrinkle/Extender: [name], [name] [name], [name] Weeks in Treatment: 220 Wound Status Wound Number: 25 Primary Venous Leg Ulcer Etiology: Wound Location: Right, Lateral Lower Leg Wound Open Wounding Event: Gradually Appeared Status: Date Acquired: 07/06/2019 Comorbid Anemia, Sleep Apnea, Arrhythmia, Congestive Heart Failure, Weeks Of Treatment: 50 History: Coronary Artery Disease, Hypertension, Myocardial Infarction, Clustered Wound: No Type II Diabetes, Gout, Neuropathy Photos Wound Measurements Length: (cm) 1.8 Width: (cm) 0.8 Depth: (cm) 0.1 Area: (cm) 1.131 Volume: (cm) 0.113 % Reduction in Area: 58.6% % Reduction in Volume: 58.6% Epithelialization: Small (1-33%) Tunneling: No Undermining: No Wound Description Classification: Full Thickness Without Exposed Support Structures Wound Margin: Flat and Intact Exudate Amount: Medium Exudate Type: Serosanguineous Exudate Color: red, brown Foul Odor After Cleansing: No Slough/Fibrino No Wound Bed Granulation Amount: Large (67-100%) Exposed Structure Granulation Quality: Red Fascia Exposed: No Necrotic Amount: Small (1-33%) Fat Layer (Subcutaneous Tissue) Exposed: Yes Necrotic Quality: Adherent Slough Tendon Exposed: No Muscle Exposed: No Joint Exposed: No Bone Exposed: No Treatment Notes Wound #25 (Lower Leg) Wound Laterality: Right, Lateral Cleanser Wound Cleanser Discharge Instruction: Cleanse the wound with wound cleanser prior to applying a clean dressing using gauze sponges, not tissue or cotton balls. Soap and Water Discharge Instruction: May shower and wash wound with dial antibacterial soap and water prior to dressing change. Peri-Wound Care Sween Lotion (Moisturizing lotion) Discharge Instruction: Apply moisturizing lotion as directed Topical Primary  Dressing KerraCel Ag Gelling Fiber Dressing, 4x5 in (silver alginate) Discharge Instruction: Apply silver alginate to wound bed as instructed Secondary Dressing Woven Gauze Sponge, Non-Sterile 4x4 in Discharge Instruction: Apply over primary dressing as directed. ABD Pad, 5x9 Discharge Instruction: Apply over primary dressing as directed. Secured With Compression Wrap Kerlix Roll 4.5x3.1 (in/yd) Discharge Instruction: Apply Kerlix and Coban compression as directed. Coban Self-Adherent Wrap 4x5 (in/yd) Discharge Instruction: Apply over Kerlix as directed. Compression Stockings Add-Ons Electronic Signature(s) Signed: 07/05/2020 5:27:17 PM By: Sandre Kitty Signed: 07/05/2020 5:54:58 PM By: Baruch Gouty RN, BSN Entered By: Sandre Kitty on 07/05/2020 17:18:13 -------------------------------------------------------------------------------- Wound Assessment Details Patient Name: Date of Service: [name] Foley  in/yd) Discharge Instruction: Apply Kerlix and Coban compression as directed. Coban Self-Adherent Wrap 4x5 (in/yd) Discharge Instruction: Apply over Kerlix as directed. Compression Stockings Add-Ons Electronic Signature(s) Signed: 07/05/2020 5:48:16 PM By: Linton Ham MD Signed: 07/06/2020 5:17:38 PM By: [name] Entered By: Linton Ham on 07/05/2020 12:19:56 -------------------------------------------------------------------------------- Multi-Disciplinary Care Plan Details Patient Name: Date of Service: [name] Foley F. 07/05/2020 10:00 A M Medical Record Number: [id number] Patient Account Number: [id number] Date of Birth/Sex: Treating RN: October 15, 1945 (75 y.o. [name] [name], [place] Primary Care Arwilda [place]: [name], [name] Other Clinician: Referring  Lasha Echeverria: Treating Brad Mcgaughy/Extender: [name], [name] [name], [name] Weeks in Treatment: [place] reviewed with physician Active Inactive Wound/Skin Impairment Nursing Diagnoses: Impaired tissue integrity Goals: Patient/caregiver will verbalize understanding of skin care regimen Date Initiated: 11/19/2017 Target Resolution Date: 07/23/2020 Goal Status: Active Ulcer/skin breakdown will have a volume reduction of 30% by week 4 Date Initiated: 11/19/2017 Date Inactivated: 12/02/2017 Target Resolution Date: 12/22/2017 Goal Status: Unmet Unmet Reason: multipl comorbidities Interventions: Assess patient/caregiver ability to obtain necessary supplies Assess ulceration(s) every visit Provide education on ulcer and skin care Notes: Electronic Signature(s) Signed: 07/06/2020 5:17:38 PM By: [name] Entered By: [name] on 07/05/2020 11:45:58 -------------------------------------------------------------------------------- Pain Assessment Details Patient Name: Date of Service: [name] Foley F. 07/05/2020 10:00 A M Medical Record Number: [id number] Patient Account Number: [id number] Date of Birth/Sex: Treating RN: 10-01-1945 (75 y.o. [name] [name] Primary Care Malaika Arnall: [name], [name] Other Clinician: Referring Wenzel Backlund: Treating Rykker Coviello/Extender: [name], [name] [name], [name] Weeks in Treatment: 220 Active Problems Location of Pain Severity and Description of Pain Patient Has Paino No Site Locations Rate the pain. Current Pain Level: 0 Pain Management and Medication Current Pain Management: Electronic Signature(s) Signed: 07/05/2020 5:54:58 PM By: Baruch Gouty RN, BSN Entered By: Baruch Gouty on 07/05/2020 11:05:23 -------------------------------------------------------------------------------- Patient/Caregiver Education Details Patient Name: Date of Service: [name] [name] 3/15/2022andnbsp10:00 A M Medical Record  Number: [id number] Patient Account Number: [id number] Date of Birth/Gender: Treating RN: 1945-05-02 (75 y.o. [name] Primary Care Physician: [name], [name] Other Clinician: Referring Physician: Treating Physician/Extender: [name], [name] [name], [name] Weeks in Treatment: 220 Education Assessment Education Provided To: Patient Education Topics Provided Wound/Skin Impairment: Methods: Explain/Verbal Responses: State content correctly Electronic Signature(s) Signed: 07/06/2020 5:17:38 PM By: [name] Entered By: [name] on 07/05/2020 11:46:19 -------------------------------------------------------------------------------- Wound Assessment Details Patient Name: Date of Service: [name] Foley F. 07/05/2020 10:00 A M Medical Record Number: [id number] Patient Account Number: [id number] Date of Birth/Sex: Treating RN: November 24, 1945 (75 y.o. [name] [name] Primary Care Milford C[place]: [name], [name] Other Clinician: Referring Roman Sandall: Treating Zeniah Briney/Extender: [name], [name] [name], [name] Weeks in Treatment: 220 Wound Status Wound Number: 14 Primary Diabetic Wound/Ulcer of the Lower Extremity Etiology: Wound Location: Left, Lateral Lower Leg Wound Open Wounding Event: Gradually Appeared Status: Date Acquired: 08/15/2017 Comorbid Anemia, Sleep Apnea, Arrhythmia, Congestive Heart Failure, Weeks Of Treatment: 150 History: Coronary Artery Disease, Hypertension, Myocardial Infarction, Clustered Wound: Yes Type II Diabetes, Gout, Neuropathy Photos Wound Measurements Length: (cm) 10 Width: (cm) 1.7 Depth: (cm) 0.3 Clustered Quantity: 1 Area: (cm) 13.352 Volume: (cm) 4.006 % Reduction in Area: -1317.4% % Reduction in Volume: -4161.7% Epithelialization: Small (1-33%) Tunneling: No Undermining: No Wound Description Classification: Grade 2 Wound Margin: Flat and Intact Exudate Amount: Medium Exudate Type: Purulent Exudate Color: yellow,  brown, green Foul Odor After Cleansing: No Slough/Fibrino Yes Wound Bed Granulation Amount: Medium (34-66%) Exposed Structure Granulation Quality: Red Fascia Exposed: No Necrotic Amount: Medium (34-66%) Fat  Ag Gelling Fiber Dressing, 2x2 in (silver alginate) Discharge Instruction: Apply silver alginate to wound bed as instructed Secondary Dressing Woven Gauze Sponge, Non-Sterile 4x4 in Discharge Instruction: Apply over primary dressing as directed. ABD Pad, 5x9 Discharge Instruction: Apply over primary dressing as directed. Secured With Compression Wrap Kerlix Roll 4.5x3.1 (in/yd) Discharge Instruction: Apply Kerlix and Coban compression as directed. Coban Self-Adherent Wrap 4x5 (in/yd) Discharge Instruction: Apply over Kerlix as directed. Compression Stockings Add-Ons Electronic Signature(s) Signed: 07/05/2020 5:27:17 PM By: Sandre Kitty Signed: 07/05/2020 5:54:58 PM By: Baruch Gouty RN, BSN Entered By: Sandre Kitty on 07/05/2020  17:19:29 -------------------------------------------------------------------------------- Wound Assessment Details Patient Name: Date of Service: [name] Foley F. 07/05/2020 10:00 A M Medical Record Number: [id number] Patient Account Number: [id number] Date of Birth/Sex: Treating RN: October 01, 1945 (75 y.o. [name] [name] Primary Care Brentley Landfair: [name], [name] Other Clinician: Referring Ilda Laskin: Treating Langdon Crosson/Extender: [name], [name] [name], [name] Weeks in Treatment: 220 Wound Status Wound Number: 34 Primary Diabetic Wound/Ulcer of the Lower Extremity Etiology: Wound Location: Right, Medial, Dorsal Foot Wound Open Wounding Event: Gradually Appeared Status: Date Acquired: 06/14/2020 Comorbid Anemia, Sleep Apnea, Arrhythmia, Congestive Heart Failure, Weeks Of Treatment: 3 History: Coronary Artery Disease, Hypertension, Myocardial Infarction, Clustered Wound: No Type II Diabetes, Gout, Neuropathy Photos Wound Measurements Length: (cm) 1 Width: (cm) 1 Depth: (cm) 0.2 Area: (cm) 0.785 Volume: (cm) 0.157 % Reduction in Area: -185.5% % Reduction in Volume: -481.5% Epithelialization: Small (1-33%) Tunneling: No Undermining: No Wound Description Classification: Grade 2 Wound Margin: Flat and Intact Exudate Amount: Medium Exudate Type: Serosanguineous Exudate Color: red, brown Foul Odor After Cleansing: No Slough/Fibrino Yes Wound Bed Granulation Amount: Large (67-100%) Exposed Structure Granulation Quality: Red Fascia Exposed: No Necrotic Amount: Small (1-33%) Fat Layer (Subcutaneous Tissue) Exposed: Yes Necrotic Quality: Adherent Slough Tendon Exposed: No Muscle Exposed: No Joint Exposed: No Bone Exposed: No Treatment Notes Wound #34 (Foot) Wound Laterality: Dorsal, Right, Medial Cleanser Wound Cleanser Discharge Instruction: Cleanse the wound with wound cleanser prior to applying a clean dressing using gauze sponges, not tissue or cotton balls. Soap and  Water Discharge Instruction: May shower and wash wound with dial antibacterial soap and water prior to dressing change. Peri-Wound Care Sween Lotion (Moisturizing lotion) Discharge Instruction: Apply moisturizing lotion as directed Topical Primary Dressing KerraCel Ag Gelling Fiber Dressing, 4x5 in (silver alginate) Discharge Instruction: Apply silver alginate to wound bed as instructed Secondary Dressing Woven Gauze Sponge, Non-Sterile 4x4 in Discharge Instruction: Apply over primary dressing as directed. ABD Pad, 5x9 Discharge Instruction: Apply over primary dressing as directed. Secured With Compression Wrap Kerlix Roll 4.5x3.1 (in/yd) Discharge Instruction: Apply Kerlix and Coban compression as directed. Coban Self-Adherent Wrap 4x5 (in/yd) Discharge Instruction: Apply over Kerlix as directed. Compression Stockings Add-Ons Electronic Signature(s) Signed: 07/05/2020 5:27:17 PM By: Sandre Kitty Signed: 07/05/2020 5:54:58 PM By: Baruch Gouty RN, BSN Entered By: Sandre Kitty on 07/05/2020 17:18:53 -------------------------------------------------------------------------------- Wound Assessment Details Patient Name: Date of Service: [name] Foley F. 07/05/2020 10:00 A M Medical Record Number: [id number] Patient Account Number: [id number] Date of Birth/Sex: Treating RN: [birth year] (75 y.o. [name] [name] Primary Care Treasa Bradshaw: [name], [name] Other Clinician: Referring Irmalee Riemenschneider: Treating Denzal Meir/Extender: [name], [name] [name], [name] Weeks in Treatment: 220 Wound Status Wound Number: 35 Primary Diabetic Wound/Ulcer of the Lower Extremity Etiology: Wound Location: Left, Lateral, Dorsal Foot Wound Open Wounding Event: Gradually Appeared Status: Date Acquired: 05/24/2020 Comorbid Anemia, Sleep Apnea, Arrhythmia, Congestive Heart Failure, Weeks Of Treatment: 3 History: Coronary Artery Disease, Hypertension, Myocardial Infarction, Clustered Wound: No Type  Layer (Subcutaneous Tissue) Exposed: Yes Necrotic Quality: Eschar, Adherent Slough Tendon Exposed: No Muscle Exposed: No Joint Exposed: No Bone Exposed: No Treatment Notes Wound #14 (Lower Leg) Wound Laterality: Left, Lateral Cleanser Wound Cleanser Discharge Instruction: Cleanse the wound with wound cleanser prior to applying a clean dressing using gauze sponges, not tissue or cotton balls. Soap and Water Discharge Instruction: May shower and wash wound with dial antibacterial soap and water prior to dressing change. Peri-Wound Care Sween Lotion (Moisturizing lotion) Discharge Instruction: Apply moisturizing lotion as directed Topical Primary Dressing KerraCel Ag Gelling Fiber Dressing, 4x5 in (silver alginate) Discharge Instruction: Apply silver alginate to wound bed as instructed Secondary Dressing Woven Gauze Sponge, Non-Sterile 4x4 in Discharge Instruction: Apply over primary dressing as directed. ABD Pad, 5x9 Discharge Instruction: Apply over primary dressing as directed. Secured With Compression Wrap Kerlix Roll 4.5x3.1 (in/yd) Discharge Instruction: Apply Kerlix and Coban compression as directed. Coban Self-Adherent Wrap 4x5 (in/yd) Discharge Instruction: Apply over Kerlix as directed. Compression Stockings Add-Ons Electronic Signature(s) Signed: 07/05/2020 5:27:17 PM By: Sandre Kitty Signed: 07/05/2020 5:54:58 PM By: Baruch Gouty RN, BSN Entered By: Sandre Kitty on 07/05/2020 17:20:35 -------------------------------------------------------------------------------- Wound Assessment Details Patient Name: Date of Service: [name] Foley F. 07/05/2020 10:00 A M Medical Record Number: [id number] Patient Account Number: [id number] Date of  Birth/Sex: Treating RN: 03-Feb-1946 (75 y.o. [name] [name] Primary Care Roslind Michaux: [name], [name] Other Clinician: Referring Britzy Graul: Treating Kwali Wrinkle/Extender: [name], [name] [name], [name] Weeks in Treatment: 220 Wound Status Wound Number: 25 Primary Venous Leg Ulcer Etiology: Wound Location: Right, Lateral Lower Leg Wound Open Wounding Event: Gradually Appeared Status: Date Acquired: 07/06/2019 Comorbid Anemia, Sleep Apnea, Arrhythmia, Congestive Heart Failure, Weeks Of Treatment: 50 History: Coronary Artery Disease, Hypertension, Myocardial Infarction, Clustered Wound: No Type II Diabetes, Gout, Neuropathy Photos Wound Measurements Length: (cm) 1.8 Width: (cm) 0.8 Depth: (cm) 0.1 Area: (cm) 1.131 Volume: (cm) 0.113 % Reduction in Area: 58.6% % Reduction in Volume: 58.6% Epithelialization: Small (1-33%) Tunneling: No Undermining: No Wound Description Classification: Full Thickness Without Exposed Support Structures Wound Margin: Flat and Intact Exudate Amount: Medium Exudate Type: Serosanguineous Exudate Color: red, brown Foul Odor After Cleansing: No Slough/Fibrino No Wound Bed Granulation Amount: Large (67-100%) Exposed Structure Granulation Quality: Red Fascia Exposed: No Necrotic Amount: Small (1-33%) Fat Layer (Subcutaneous Tissue) Exposed: Yes Necrotic Quality: Adherent Slough Tendon Exposed: No Muscle Exposed: No Joint Exposed: No Bone Exposed: No Treatment Notes Wound #25 (Lower Leg) Wound Laterality: Right, Lateral Cleanser Wound Cleanser Discharge Instruction: Cleanse the wound with wound cleanser prior to applying a clean dressing using gauze sponges, not tissue or cotton balls. Soap and Water Discharge Instruction: May shower and wash wound with dial antibacterial soap and water prior to dressing change. Peri-Wound Care Sween Lotion (Moisturizing lotion) Discharge Instruction: Apply moisturizing lotion as directed Topical Primary  Dressing KerraCel Ag Gelling Fiber Dressing, 4x5 in (silver alginate) Discharge Instruction: Apply silver alginate to wound bed as instructed Secondary Dressing Woven Gauze Sponge, Non-Sterile 4x4 in Discharge Instruction: Apply over primary dressing as directed. ABD Pad, 5x9 Discharge Instruction: Apply over primary dressing as directed. Secured With Compression Wrap Kerlix Roll 4.5x3.1 (in/yd) Discharge Instruction: Apply Kerlix and Coban compression as directed. Coban Self-Adherent Wrap 4x5 (in/yd) Discharge Instruction: Apply over Kerlix as directed. Compression Stockings Add-Ons Electronic Signature(s) Signed: 07/05/2020 5:27:17 PM By: Sandre Kitty Signed: 07/05/2020 5:54:58 PM By: Baruch Gouty RN, BSN Entered By: Sandre Kitty on 07/05/2020 17:18:13 -------------------------------------------------------------------------------- Wound Assessment Details Patient Name: Date of Service: [name] Foley  F. 07/05/2020 10:00 A M Medical Record Number: [id number] Patient Account Number: [id number] Date of Birth/Sex: Treating RN: [birth year] (75 y.o. [name] [name] Primary Care Kamica Florance: [name], [name] Other Clinician: Referring Shyrl Obi: Treating Sabra Sessler/Extender: [name], [name] [name], [name] Weeks in Treatment: 220 Wound Status Wound Number: 26 Primary Diabetic Wound/Ulcer of the Lower Extremity Etiology: Wound Location: Left, Dorsal Foot Wound Open Wounding Event: Gradually Appeared Status: Date Acquired: 08/18/2019 Comorbid Anemia, Sleep Apnea, Arrhythmia, Congestive Heart Failure, Weeks Of Treatment: 46 History: Coronary Artery Disease, Hypertension, Myocardial Infarction, Clustered Wound: Yes Type II Diabetes, Gout, Neuropathy Photos Wound Measurements Length: (cm) 3.3 Width: (cm) 1.3 Depth: (cm) 0.1 Clustered Quantity: 4 Area: (cm) 3.369 Volume: (cm) 0.337 % Reduction in Area: -512.5% % Reduction in Volume: -512.7% Epithelialization:  None Tunneling: No Undermining: No Wound Description Classification: Grade 2 Wound Margin: Flat and Intact Exudate Amount: Medium Exudate Type: Purulent Exudate Color: yellow, brown, green Foul Odor After Cleansing: No Slough/Fibrino Yes Wound Bed Granulation Amount: Small (1-33%) Exposed Structure Granulation Quality: Red, Pink Fascia Exposed: No Necrotic Amount: Large (67-100%) Fat Layer (Subcutaneous Tissue) Exposed: Yes Necrotic Quality: Eschar, Adherent Slough Tendon Exposed: Yes Muscle Exposed: No Joint Exposed: No Bone Exposed: No Treatment Notes Wound #26 (Foot) Wound Laterality: Dorsal, Left Cleanser Wound Cleanser Discharge Instruction: Cleanse the wound with wound cleanser prior to applying a clean dressing using gauze sponges, not tissue or cotton balls. Soap and Water Discharge Instruction: May shower and wash wound with dial antibacterial soap and water prior to dressing change. Peri-Wound Care Sween Lotion (Moisturizing lotion) Discharge Instruction: Apply moisturizing lotion as directed Topical Primary Dressing KerraCel Ag Gelling Fiber Dressing, 4x5 in (silver alginate) Discharge Instruction: Apply silver alginate to wound bed as instructed Secondary Dressing Woven Gauze Sponge, Non-Sterile 4x4 in Discharge Instruction: Apply over primary dressing as directed. ABD Pad, 5x9 Discharge Instruction: Apply over primary dressing as directed. Secured With Compression Wrap Kerlix Roll 4.5x3.1 (in/yd) Discharge Instruction: Apply Kerlix and Coban compression as directed. Coban Self-Adherent Wrap 4x5 (in/yd) Discharge Instruction: Apply over Kerlix as directed. Compression Stockings Add-Ons Electronic Signature(s) Signed: 07/05/2020 5:27:17 PM By: Sandre Kitty Signed: 07/05/2020 5:54:58 PM By: Baruch Gouty RN, BSN Entered By: Sandre Kitty on 07/05/2020 17:21:02 -------------------------------------------------------------------------------- Wound  Assessment Details Patient Name: Date of Service: [name] Foley F. 07/05/2020 10:00 A M Medical Record Number: [id number] Patient Account Number: [id number] Date of Birth/Sex: Treating RN: 1945/06/10 (75 y.o. [name] [name] Primary Care Malikye Reppond: [name], [name] Other Clinician: Referring Shanise Balch: Treating Samual Beals/Extender: [name], [name] [name], [name] Weeks in Treatment: 220 Wound Status Wound Number: 28 Primary Diabetic Wound/Ulcer of the Lower Extremity Etiology: Wound Location: Right, Dorsal Foot Wound Open Wounding Event: Gradually Appeared Status: Date Acquired: 04/26/2020 Comorbid Anemia, Sleep Apnea, Arrhythmia, Congestive Heart Failure, Weeks Of Treatment: 10 History: Coronary Artery Disease, Hypertension, Myocardial Infarction, Clustered Wound: Yes Type II Diabetes, Gout, Neuropathy Photos Wound Measurements Length: (cm) 0.5 Width: (cm) 0.6 Depth: (cm) 0.1 Clustered Quantity: 2 Area: (cm) 0.236 Volume: (cm) 0.024 % Reduction in Area: 77.2% % Reduction in Volume: 76.9% Epithelialization: Medium (34-66%) Tunneling: No Undermining: No Wound Description Classification: Grade 1 Wound Margin: Flat and Intact Exudate Amount: Small Exudate Type: Serosanguineous Exudate Color: red, brown Foul Odor After Cleansing: No Slough/Fibrino Yes Wound Bed Granulation Amount: Medium (34-66%) Exposed Structure Granulation Quality: Pink Fascia Exposed: No Necrotic Amount: Medium (34-66%) Fat Layer (Subcutaneous Tissue) Exposed: Yes Necrotic Quality: Adherent Slough Tendon Exposed: No Muscle Exposed: No Joint Exposed: No Bone Exposed: No Treatment Notes Wound #28 (  [name] [name], [name] [name] ([id number]) Visit Report for 07/05/2020 Arrival Information Details Patient Name: Date of Service: [name] [name] [facility] F. 07/05/2020 10:00 A M Medical Record Number: [id number] Patient Account Number: [id number] Date of Birth/Sex: Treating RN: [birth year] (75 y.o. [name] [name] Primary Care Ilona Colley: [name], [name] Other Clinician: Referring Calem Cocozza: Treating Kalaysia Demonbreun/Extender: [name], [name] [name], [name] Weeks in Treatment: 220 Visit Information History Since Last Visit Added or deleted any medications: No Patient Arrived: Wheel Chair Any new allergies or adverse reactions: No Arrival Time: 10:36 Had a fall or experienced change in No Accompanied By: daughter activities of daily living that may affect Transfer Assistance: EasyPivot Patient Lift risk of falls: Patient Identification Verified: Yes Signs or symptoms of abuse/neglect since last visito No Secondary Verification Process Completed: Yes Hospitalized since last visit: No Patient Requires Transmission-Based Precautions: No Implantable device outside of the clinic excluding No Patient Has Alerts: Yes cellular tissue based products placed in the center Patient Alerts: Patient on Blood Thinner since last visit: left ABI 1.0; rt ABI 1.09 Has Dressing in Place as Prescribed: Yes Pain Present Now: No Electronic Signature(s) Signed: 07/05/2020 5:54:58 PM By: Baruch Gouty RN, BSN Entered By: Baruch Gouty on 07/05/2020 10:36:48 -------------------------------------------------------------------------------- Encounter Discharge Information Details Patient Name: Date of Service: [name] Foley F. 07/05/2020 10:00 A M Medical Record Number: [id number] Patient Account Number: [id number] Date of Birth/Sex: Treating RN: [birth year] (75 y.o. [name] [name], [name] [name] Primary Care Osmond Steckman: [name], [name] Other Clinician: Referring Lathan Gieselman: Treating Lilleigh Hechavarria/Extender: [name], [name] [name], [name] Weeks in  Treatment: 220 Encounter Discharge Information Items Post Procedure Vitals Discharge Condition: Stable Temperature (F): 98.5 Ambulatory Status: Wheelchair Pulse (bpm): 73 Discharge Destination: Home Respiratory Rate (breaths/min): 18 Transportation: Private Auto Blood Pressure (mmHg): 115/73 Accompanied By: Daughter Schedule Follow-up Appointment: Yes Clinical Summary of Care: Provided on 07/05/2020 Form Type Recipient Paper Patient Patient Electronic Signature(s) Signed: 07/05/2020 11:49:55 AM By: Lorrin Jackson Entered By: Lorrin Jackson on 07/05/2020 11:49:54 -------------------------------------------------------------------------------- Lower Extremity Assessment Details Patient Name: Date of Service: [name] Foley F. 07/05/2020 10:00 A M Medical Record Number: [id number] Patient Account Number: [id number] Date of Birth/Sex: Treating RN: 1945/10/12 (75 y.o. [name] [name] Primary Care Phoenyx Melka: [name], [name] Other Clinician: Referring Zaydn Gutridge: Treating Ethelean Colla/Extender: [name], [name] [name], [name] Weeks in Treatment: 220 Edema Assessment Assessed: [Left: No] [Right: No] Edema: [Left: Yes] [Right: No] Calf Left: Right: Point of Measurement: 36 cm From Medial Instep 24.3 cm 24 cm Ankle Left: Right: Point of Measurement: 11 cm From Medial Instep 18.5 cm 19 cm Vascular Assessment Pulses: Dorsalis Pedis Palpable: [Right:No] Electronic Signature(s) Signed: 07/05/2020 5:54:58 PM By: Baruch Gouty RN, BSN Entered By: Baruch Gouty on 07/05/2020 11:06:36 -------------------------------------------------------------------------------- Multi Wound Chart Details Patient Name: Date of Service: [name] Foley F. 07/05/2020 10:00 A M Medical Record Number: [id number] Patient Account Number: [id number] Date of Birth/Sex: Treating RN: Jan 02, 1946 (75 y.o. [name] [name], [name] [name] Primary Care Spero Gunnels: [name], [name] Other Clinician: Referring Temika Sutphin: Treating  Karmen Altamirano/Extender: [name], [name] [name], [name] Weeks in Treatment: 220 Vital Signs Height(in): 74 Capillary Blood Glucose(mg/dl): 119 Weight(lbs): 150 Pulse(bpm): 73 Body Mass Index(BMI): 19 Blood Pressure(mmHg): 115/73 Temperature(F): 98.5 Respiratory Rate(breaths/min): 18 Photos: [14:No Photos Left, Lateral Lower Leg] [25:No Photos Right, Lateral Lower Leg] [26:No Photos Left, Dorsal Foot] Wound Location: [14:Gradually Appeared] [25:Gradually Appeared] [26:Gradually Appeared] Wounding Event: [14:Diabetic Wound/Ulcer of the Lower] [25:Venous Leg Ulcer] [26:Diabetic Wound/Ulcer of the Lower] Primary Etiology: [14:Extremity Anemia, Sleep Apnea, Arrhythmia,] [25:Anemia, Sleep Apnea, Arrhythmia,] [26:Extremity Anemia, Sleep Apnea, Arrhythmia,] Comorbid History: [14:Congestive  F. 07/05/2020 10:00 A M Medical Record Number: [id number] Patient Account Number: [id number] Date of Birth/Sex: Treating RN: [birth year] (75 y.o. [name] [name] Primary Care Kamica Florance: [name], [name] Other Clinician: Referring Shyrl Obi: Treating Sabra Sessler/Extender: [name], [name] [name], [name] Weeks in Treatment: 220 Wound Status Wound Number: 26 Primary Diabetic Wound/Ulcer of the Lower Extremity Etiology: Wound Location: Left, Dorsal Foot Wound Open Wounding Event: Gradually Appeared Status: Date Acquired: 08/18/2019 Comorbid Anemia, Sleep Apnea, Arrhythmia, Congestive Heart Failure, Weeks Of Treatment: 46 History: Coronary Artery Disease, Hypertension, Myocardial Infarction, Clustered Wound: Yes Type II Diabetes, Gout, Neuropathy Photos Wound Measurements Length: (cm) 3.3 Width: (cm) 1.3 Depth: (cm) 0.1 Clustered Quantity: 4 Area: (cm) 3.369 Volume: (cm) 0.337 % Reduction in Area: -512.5% % Reduction in Volume: -512.7% Epithelialization:  None Tunneling: No Undermining: No Wound Description Classification: Grade 2 Wound Margin: Flat and Intact Exudate Amount: Medium Exudate Type: Purulent Exudate Color: yellow, brown, green Foul Odor After Cleansing: No Slough/Fibrino Yes Wound Bed Granulation Amount: Small (1-33%) Exposed Structure Granulation Quality: Red, Pink Fascia Exposed: No Necrotic Amount: Large (67-100%) Fat Layer (Subcutaneous Tissue) Exposed: Yes Necrotic Quality: Eschar, Adherent Slough Tendon Exposed: Yes Muscle Exposed: No Joint Exposed: No Bone Exposed: No Treatment Notes Wound #26 (Foot) Wound Laterality: Dorsal, Left Cleanser Wound Cleanser Discharge Instruction: Cleanse the wound with wound cleanser prior to applying a clean dressing using gauze sponges, not tissue or cotton balls. Soap and Water Discharge Instruction: May shower and wash wound with dial antibacterial soap and water prior to dressing change. Peri-Wound Care Sween Lotion (Moisturizing lotion) Discharge Instruction: Apply moisturizing lotion as directed Topical Primary Dressing KerraCel Ag Gelling Fiber Dressing, 4x5 in (silver alginate) Discharge Instruction: Apply silver alginate to wound bed as instructed Secondary Dressing Woven Gauze Sponge, Non-Sterile 4x4 in Discharge Instruction: Apply over primary dressing as directed. ABD Pad, 5x9 Discharge Instruction: Apply over primary dressing as directed. Secured With Compression Wrap Kerlix Roll 4.5x3.1 (in/yd) Discharge Instruction: Apply Kerlix and Coban compression as directed. Coban Self-Adherent Wrap 4x5 (in/yd) Discharge Instruction: Apply over Kerlix as directed. Compression Stockings Add-Ons Electronic Signature(s) Signed: 07/05/2020 5:27:17 PM By: Sandre Kitty Signed: 07/05/2020 5:54:58 PM By: Baruch Gouty RN, BSN Entered By: Sandre Kitty on 07/05/2020 17:21:02 -------------------------------------------------------------------------------- Wound  Assessment Details Patient Name: Date of Service: [name] Foley F. 07/05/2020 10:00 A M Medical Record Number: [id number] Patient Account Number: [id number] Date of Birth/Sex: Treating RN: 1945/06/10 (75 y.o. [name] [name] Primary Care Malikye Reppond: [name], [name] Other Clinician: Referring Shanise Balch: Treating Samual Beals/Extender: [name], [name] [name], [name] Weeks in Treatment: 220 Wound Status Wound Number: 28 Primary Diabetic Wound/Ulcer of the Lower Extremity Etiology: Wound Location: Right, Dorsal Foot Wound Open Wounding Event: Gradually Appeared Status: Date Acquired: 04/26/2020 Comorbid Anemia, Sleep Apnea, Arrhythmia, Congestive Heart Failure, Weeks Of Treatment: 10 History: Coronary Artery Disease, Hypertension, Myocardial Infarction, Clustered Wound: Yes Type II Diabetes, Gout, Neuropathy Photos Wound Measurements Length: (cm) 0.5 Width: (cm) 0.6 Depth: (cm) 0.1 Clustered Quantity: 2 Area: (cm) 0.236 Volume: (cm) 0.024 % Reduction in Area: 77.2% % Reduction in Volume: 76.9% Epithelialization: Medium (34-66%) Tunneling: No Undermining: No Wound Description Classification: Grade 1 Wound Margin: Flat and Intact Exudate Amount: Small Exudate Type: Serosanguineous Exudate Color: red, brown Foul Odor After Cleansing: No Slough/Fibrino Yes Wound Bed Granulation Amount: Medium (34-66%) Exposed Structure Granulation Quality: Pink Fascia Exposed: No Necrotic Amount: Medium (34-66%) Fat Layer (Subcutaneous Tissue) Exposed: Yes Necrotic Quality: Adherent Slough Tendon Exposed: No Muscle Exposed: No Joint Exposed: No Bone Exposed: No Treatment Notes Wound #28 (  II  Diabetes, Gout, Neuropathy Photos Wound Measurements Length: (cm) 1.5 Width: (cm) 1.8 Depth: (cm) 0.3 Area: (cm) 2.121 Volume: (cm) 0.636 % Reduction in Area: -350.3% % Reduction in Volume: -169.5% Epithelialization: Small (1-33%) Tunneling: No Undermining: Yes Starting Position (o'clock): 1 Ending Position (o'clock): 6 Maximum Distance: (cm) 0.5 Wound Description Classification: Grade 2 Wound Margin: Well defined, not attached Exudate Amount: Medium Exudate Type: Serosanguineous Exudate Color: red, brown Foul Odor After Cleansing: No Slough/Fibrino Yes Wound Bed Granulation Amount: Large (67-100%) Exposed Structure Granulation Quality: Red, Hyper-granulation, Friable Fascia Exposed: No Necrotic Amount: Small (1-33%) Fat Layer (Subcutaneous Tissue) Exposed: Yes Necrotic Quality: Adherent Slough Tendon Exposed: No Muscle Exposed: No Joint Exposed: No Bone Exposed: Yes Treatment Notes Wound #35 (Foot) Wound Laterality: Dorsal, Left, Lateral Cleanser Wound Cleanser Discharge Instruction: Cleanse the wound with wound cleanser prior to applying a clean dressing using gauze sponges, not tissue or cotton balls. Soap and Water Discharge Instruction: May shower and wash wound with dial antibacterial soap and water prior to dressing change. Peri-Wound Care Sween Lotion (Moisturizing lotion) Discharge Instruction: Apply moisturizing lotion as directed Topical Primary Dressing KerraCel Ag Gelling Fiber Dressing, 2x2 in (silver alginate) Discharge Instruction: Apply silver alginate to wound bed as instructed Secondary Dressing Woven Gauze Sponge, Non-Sterile 4x4 in Discharge Instruction: Apply over primary dressing as directed. ABD Pad, 5x9 Discharge Instruction: Apply over primary dressing as directed. Secured With Compression Wrap Kerlix Roll 4.5x3.1 (in/yd) Discharge Instruction: Apply Kerlix and Coban compression as directed. Coban Self-Adherent Wrap 4x5  (in/yd) Discharge Instruction: Apply over Kerlix as directed. Compression Stockings Add-Ons Electronic Signature(s) Signed: 07/05/2020 5:27:17 PM By: Sandre Kitty Signed: 07/05/2020 5:54:58 PM By: Baruch Gouty RN, BSN Entered By: Sandre Kitty on 07/05/2020 17:22:14 -------------------------------------------------------------------------------- Vitals Details Patient Name: Date of Service: [name] Foley F. 07/05/2020 10:00 A M Medical Record Number: [id number] Patient Account Number: [id number] Date of Birth/Sex: Treating RN: Aug 13, 1945 (75 y.o. [name] [name] Primary Care Tirsa Gail: [name], [name] Other Clinician: Referring Taliya Mcclard: Treating Meril Dray/Extender: [name], [name] [name], [name] Weeks in Treatment: 220 Vital Signs Time Taken: 11:04 Temperature (F): 98.5 Height (in): 74 Pulse (bpm): 73 Source: Stated Respiratory Rate (breaths/min): 18 Weight (lbs): 150 Blood Pressure (mmHg): 115/73 Source: Stated Capillary Blood Glucose (mg/dl): 119 Body Mass Index (BMI): 19.3 Reference Range: 80 - 120 mg / dl Notes glucose per pt report this am Electronic Signature(s) Signed: 07/05/2020 5:54:58 PM By: Baruch Gouty RN, BSN Entered By: Baruch Gouty on 07/05/2020 11:05:14  Ag Gelling Fiber Dressing, 2x2 in (silver alginate) Discharge Instruction: Apply silver alginate to wound bed as instructed Secondary Dressing Woven Gauze Sponge, Non-Sterile 4x4 in Discharge Instruction: Apply over primary dressing as directed. ABD Pad, 5x9 Discharge Instruction: Apply over primary dressing as directed. Secured With Compression Wrap Kerlix Roll 4.5x3.1 (in/yd) Discharge Instruction: Apply Kerlix and Coban compression as directed. Coban Self-Adherent Wrap 4x5 (in/yd) Discharge Instruction: Apply over Kerlix as directed. Compression Stockings Add-Ons Electronic Signature(s) Signed: 07/05/2020 5:27:17 PM By: Sandre Kitty Signed: 07/05/2020 5:54:58 PM By: Baruch Gouty RN, BSN Entered By: Sandre Kitty on 07/05/2020  17:19:29 -------------------------------------------------------------------------------- Wound Assessment Details Patient Name: Date of Service: [name] Foley F. 07/05/2020 10:00 A M Medical Record Number: [id number] Patient Account Number: [id number] Date of Birth/Sex: Treating RN: October 01, 1945 (75 y.o. [name] [name] Primary Care Brentley Landfair: [name], [name] Other Clinician: Referring Ilda Laskin: Treating Langdon Crosson/Extender: [name], [name] [name], [name] Weeks in Treatment: 220 Wound Status Wound Number: 34 Primary Diabetic Wound/Ulcer of the Lower Extremity Etiology: Wound Location: Right, Medial, Dorsal Foot Wound Open Wounding Event: Gradually Appeared Status: Date Acquired: 06/14/2020 Comorbid Anemia, Sleep Apnea, Arrhythmia, Congestive Heart Failure, Weeks Of Treatment: 3 History: Coronary Artery Disease, Hypertension, Myocardial Infarction, Clustered Wound: No Type II Diabetes, Gout, Neuropathy Photos Wound Measurements Length: (cm) 1 Width: (cm) 1 Depth: (cm) 0.2 Area: (cm) 0.785 Volume: (cm) 0.157 % Reduction in Area: -185.5% % Reduction in Volume: -481.5% Epithelialization: Small (1-33%) Tunneling: No Undermining: No Wound Description Classification: Grade 2 Wound Margin: Flat and Intact Exudate Amount: Medium Exudate Type: Serosanguineous Exudate Color: red, brown Foul Odor After Cleansing: No Slough/Fibrino Yes Wound Bed Granulation Amount: Large (67-100%) Exposed Structure Granulation Quality: Red Fascia Exposed: No Necrotic Amount: Small (1-33%) Fat Layer (Subcutaneous Tissue) Exposed: Yes Necrotic Quality: Adherent Slough Tendon Exposed: No Muscle Exposed: No Joint Exposed: No Bone Exposed: No Treatment Notes Wound #34 (Foot) Wound Laterality: Dorsal, Right, Medial Cleanser Wound Cleanser Discharge Instruction: Cleanse the wound with wound cleanser prior to applying a clean dressing using gauze sponges, not tissue or cotton balls. Soap and  Water Discharge Instruction: May shower and wash wound with dial antibacterial soap and water prior to dressing change. Peri-Wound Care Sween Lotion (Moisturizing lotion) Discharge Instruction: Apply moisturizing lotion as directed Topical Primary Dressing KerraCel Ag Gelling Fiber Dressing, 4x5 in (silver alginate) Discharge Instruction: Apply silver alginate to wound bed as instructed Secondary Dressing Woven Gauze Sponge, Non-Sterile 4x4 in Discharge Instruction: Apply over primary dressing as directed. ABD Pad, 5x9 Discharge Instruction: Apply over primary dressing as directed. Secured With Compression Wrap Kerlix Roll 4.5x3.1 (in/yd) Discharge Instruction: Apply Kerlix and Coban compression as directed. Coban Self-Adherent Wrap 4x5 (in/yd) Discharge Instruction: Apply over Kerlix as directed. Compression Stockings Add-Ons Electronic Signature(s) Signed: 07/05/2020 5:27:17 PM By: Sandre Kitty Signed: 07/05/2020 5:54:58 PM By: Baruch Gouty RN, BSN Entered By: Sandre Kitty on 07/05/2020 17:18:53 -------------------------------------------------------------------------------- Wound Assessment Details Patient Name: Date of Service: [name] Foley F. 07/05/2020 10:00 A M Medical Record Number: [id number] Patient Account Number: [id number] Date of Birth/Sex: Treating RN: [birth year] (75 y.o. [name] [name] Primary Care Treasa Bradshaw: [name], [name] Other Clinician: Referring Irmalee Riemenschneider: Treating Denzal Meir/Extender: [name], [name] [name], [name] Weeks in Treatment: 220 Wound Status Wound Number: 35 Primary Diabetic Wound/Ulcer of the Lower Extremity Etiology: Wound Location: Left, Lateral, Dorsal Foot Wound Open Wounding Event: Gradually Appeared Status: Date Acquired: 05/24/2020 Comorbid Anemia, Sleep Apnea, Arrhythmia, Congestive Heart Failure, Weeks Of Treatment: 3 History: Coronary Artery Disease, Hypertension, Myocardial Infarction, Clustered Wound: No Type  F. 07/05/2020 10:00 A M Medical Record Number: [id number] Patient Account Number: [id number] Date of Birth/Sex: Treating RN: [birth year] (75 y.o. [name] [name] Primary Care Kamica Florance: [name], [name] Other Clinician: Referring Shyrl Obi: Treating Sabra Sessler/Extender: [name], [name] [name], [name] Weeks in Treatment: 220 Wound Status Wound Number: 26 Primary Diabetic Wound/Ulcer of the Lower Extremity Etiology: Wound Location: Left, Dorsal Foot Wound Open Wounding Event: Gradually Appeared Status: Date Acquired: 08/18/2019 Comorbid Anemia, Sleep Apnea, Arrhythmia, Congestive Heart Failure, Weeks Of Treatment: 46 History: Coronary Artery Disease, Hypertension, Myocardial Infarction, Clustered Wound: Yes Type II Diabetes, Gout, Neuropathy Photos Wound Measurements Length: (cm) 3.3 Width: (cm) 1.3 Depth: (cm) 0.1 Clustered Quantity: 4 Area: (cm) 3.369 Volume: (cm) 0.337 % Reduction in Area: -512.5% % Reduction in Volume: -512.7% Epithelialization:  None Tunneling: No Undermining: No Wound Description Classification: Grade 2 Wound Margin: Flat and Intact Exudate Amount: Medium Exudate Type: Purulent Exudate Color: yellow, brown, green Foul Odor After Cleansing: No Slough/Fibrino Yes Wound Bed Granulation Amount: Small (1-33%) Exposed Structure Granulation Quality: Red, Pink Fascia Exposed: No Necrotic Amount: Large (67-100%) Fat Layer (Subcutaneous Tissue) Exposed: Yes Necrotic Quality: Eschar, Adherent Slough Tendon Exposed: Yes Muscle Exposed: No Joint Exposed: No Bone Exposed: No Treatment Notes Wound #26 (Foot) Wound Laterality: Dorsal, Left Cleanser Wound Cleanser Discharge Instruction: Cleanse the wound with wound cleanser prior to applying a clean dressing using gauze sponges, not tissue or cotton balls. Soap and Water Discharge Instruction: May shower and wash wound with dial antibacterial soap and water prior to dressing change. Peri-Wound Care Sween Lotion (Moisturizing lotion) Discharge Instruction: Apply moisturizing lotion as directed Topical Primary Dressing KerraCel Ag Gelling Fiber Dressing, 4x5 in (silver alginate) Discharge Instruction: Apply silver alginate to wound bed as instructed Secondary Dressing Woven Gauze Sponge, Non-Sterile 4x4 in Discharge Instruction: Apply over primary dressing as directed. ABD Pad, 5x9 Discharge Instruction: Apply over primary dressing as directed. Secured With Compression Wrap Kerlix Roll 4.5x3.1 (in/yd) Discharge Instruction: Apply Kerlix and Coban compression as directed. Coban Self-Adherent Wrap 4x5 (in/yd) Discharge Instruction: Apply over Kerlix as directed. Compression Stockings Add-Ons Electronic Signature(s) Signed: 07/05/2020 5:27:17 PM By: Sandre Kitty Signed: 07/05/2020 5:54:58 PM By: Baruch Gouty RN, BSN Entered By: Sandre Kitty on 07/05/2020 17:21:02 -------------------------------------------------------------------------------- Wound  Assessment Details Patient Name: Date of Service: [name] Foley F. 07/05/2020 10:00 A M Medical Record Number: [id number] Patient Account Number: [id number] Date of Birth/Sex: Treating RN: 1945/06/10 (75 y.o. [name] [name] Primary Care Malikye Reppond: [name], [name] Other Clinician: Referring Shanise Balch: Treating Samual Beals/Extender: [name], [name] [name], [name] Weeks in Treatment: 220 Wound Status Wound Number: 28 Primary Diabetic Wound/Ulcer of the Lower Extremity Etiology: Wound Location: Right, Dorsal Foot Wound Open Wounding Event: Gradually Appeared Status: Date Acquired: 04/26/2020 Comorbid Anemia, Sleep Apnea, Arrhythmia, Congestive Heart Failure, Weeks Of Treatment: 10 History: Coronary Artery Disease, Hypertension, Myocardial Infarction, Clustered Wound: Yes Type II Diabetes, Gout, Neuropathy Photos Wound Measurements Length: (cm) 0.5 Width: (cm) 0.6 Depth: (cm) 0.1 Clustered Quantity: 2 Area: (cm) 0.236 Volume: (cm) 0.024 % Reduction in Area: 77.2% % Reduction in Volume: 76.9% Epithelialization: Medium (34-66%) Tunneling: No Undermining: No Wound Description Classification: Grade 1 Wound Margin: Flat and Intact Exudate Amount: Small Exudate Type: Serosanguineous Exudate Color: red, brown Foul Odor After Cleansing: No Slough/Fibrino Yes Wound Bed Granulation Amount: Medium (34-66%) Exposed Structure Granulation Quality: Pink Fascia Exposed: No Necrotic Amount: Medium (34-66%) Fat Layer (Subcutaneous Tissue) Exposed: Yes Necrotic Quality: Adherent Slough Tendon Exposed: No Muscle Exposed: No Joint Exposed: No Bone Exposed: No Treatment Notes Wound #28 (

## 2020-07-06 NOTE — Progress Notes (Signed)
2x2 in (silver alginate) (Generic) 1 x Per Week/15 Days ary Discharge Instructions: Apply silver alginate to wound bed as instructed Secondary Dressing: Woven Gauze Sponge, Non-Sterile 4x4 in (Generic) 1 x Per Week/15 Days Discharge Instructions: Apply over primary dressing as directed. Secondary Dressing: ABD Pad, 5x9 1 x Per Week/15 Days Discharge Instructions: Apply over primary dressing as directed. Compression Wrap: Kerlix Roll 4.5x3.1 (in/yd) (Generic) 1 x Per Week/15 Days Discharge Instructions: Apply Kerlix and Coban compression as directed. Compression Wrap: Coban Self-Adherent Wrap 4x5 (in/yd) (Generic) 1 x Per Week/15 Days Discharge Instructions: Apply over Kerlix as directed. Wound #34 - Foot Wound Laterality: Dorsal, Right, Medial Cleanser: Wound Cleanser (Generic) 1 x Per Week/15 Days Discharge Instructions: Cleanse the wound with wound cleanser prior to applying a clean dressing using gauze sponges, not tissue or cotton balls. Cleanser: Soap and Water 1 x Per Week/15 Days Discharge Instructions: May shower and wash wound with dial antibacterial soap and water prior to dressing change. Peri-Wound Care: Sween Lotion (Moisturizing lotion) 1 x Per Week/15 Days Discharge Instructions: Apply moisturizing lotion as directed Prim Dressing: KerraCel Ag Gelling Fiber Dressing, 4x5 in (silver alginate) (Generic) 1 x Per Week/15 Days ary Discharge Instructions: Apply silver alginate to wound bed as instructed Secondary Dressing: Woven Gauze Sponge, Non-Sterile 4x4 in  (Generic) 1 x Per Week/15 Days Discharge Instructions: Apply over primary dressing as directed. Secondary Dressing: ABD Pad, 5x9 1 x Per Week/15 Days Discharge Instructions: Apply over primary dressing as directed. Compression Wrap: Kerlix Roll 4.5x3.1 (in/yd) (Generic) 1 x Per Week/15 Days Discharge Instructions: Apply Kerlix and Coban compression as directed. Compression Wrap: Coban Self-Adherent Wrap 4x5 (in/yd) (Generic) 1 x Per Week/15 Days Discharge Instructions: Apply over Kerlix as directed. Wound #35 - Foot Wound Laterality: Dorsal, Left, Lateral Cleanser: Wound Cleanser (Generic) 1 x Per Week/15 Days Discharge Instructions: Cleanse the wound with wound cleanser prior to applying a clean dressing using gauze sponges, not tissue or cotton balls. Cleanser: Soap and Water 1 x Per Week/15 Days Discharge Instructions: May shower and wash wound with dial antibacterial soap and water prior to dressing change. Peri-Wound Care: Sween Lotion (Moisturizing lotion) 1 x Per Week/15 Days Discharge Instructions: Apply moisturizing lotion as directed Prim Dressing: KerraCel Ag Gelling Fiber Dressing, 2x2 in (silver alginate) (Generic) 1 x Per Week/15 Days ary Discharge Instructions: Apply silver alginate to wound bed as instructed Secondary Dressing: Woven Gauze Sponge, Non-Sterile 4x4 in (Generic) 1 x Per Week/15 Days Discharge Instructions: Apply over primary dressing as directed. Secondary Dressing: ABD Pad, 5x9 1 x Per Week/15 Days Discharge Instructions: Apply over primary dressing as directed. Compression Wrap: Kerlix Roll 4.5x3.1 (in/yd) (Generic) 1 x Per Week/15 Days Discharge Instructions: Apply Kerlix and Coban compression as directed. Compression Wrap: Coban Self-Adherent Wrap 4x5 (in/yd) (Generic) 1 x Per Week/15 Days Discharge Instructions: Apply over Kerlix as directed. Electronic Signature(s) Signed: 07/05/2020 5:48:16 PM By: Linton Ham MD Signed: 07/06/2020 5:17:38 PM By:  [name] Entered By: [name] on 07/05/2020 11:49:59 -------------------------------------------------------------------------------- Problem List Details Patient Name: Date of Service: [name] [name] F. 07/05/2020 10:00 A M Medical Record Number: [id number] Patient Account Number: [id number] Date of Birth/Sex: Treating RN: October 22, 1945 (75 y.o. [name] Primary Care Provider: [name], [name] Other Clinician: Referring Provider: Treating Provider/Extender: Umar Patmon [name], [name] Weeks in Treatment: 220 Active Problems ICD-10 Encounter Code Description Active Date MDM Diagnosis E11.621 Type 2 diabetes mellitus with foot ulcer 04/17/2016 No Yes L89.613 Pressure ulcer of right heel, stage 3  2x2 in (silver alginate) (Generic) 1 x Per Week/15 Days ary Discharge Instructions: Apply silver alginate to wound bed as instructed Secondary Dressing: Woven Gauze Sponge, Non-Sterile 4x4 in (Generic) 1 x Per Week/15 Days Discharge Instructions: Apply over primary dressing as directed. Secondary Dressing: ABD Pad, 5x9 1 x Per Week/15 Days Discharge Instructions: Apply over primary dressing as directed. Compression Wrap: Kerlix Roll 4.5x3.1 (in/yd) (Generic) 1 x Per Week/15 Days Discharge Instructions: Apply Kerlix and Coban compression as directed. Compression Wrap: Coban Self-Adherent Wrap 4x5 (in/yd) (Generic) 1 x Per Week/15 Days Discharge Instructions: Apply over Kerlix as directed. Wound #34 - Foot Wound Laterality: Dorsal, Right, Medial Cleanser: Wound Cleanser (Generic) 1 x Per Week/15 Days Discharge Instructions: Cleanse the wound with wound cleanser prior to applying a clean dressing using gauze sponges, not tissue or cotton balls. Cleanser: Soap and Water 1 x Per Week/15 Days Discharge Instructions: May shower and wash wound with dial antibacterial soap and water prior to dressing change. Peri-Wound Care: Sween Lotion (Moisturizing lotion) 1 x Per Week/15 Days Discharge Instructions: Apply moisturizing lotion as directed Prim Dressing: KerraCel Ag Gelling Fiber Dressing, 4x5 in (silver alginate) (Generic) 1 x Per Week/15 Days ary Discharge Instructions: Apply silver alginate to wound bed as instructed Secondary Dressing: Woven Gauze Sponge, Non-Sterile 4x4 in  (Generic) 1 x Per Week/15 Days Discharge Instructions: Apply over primary dressing as directed. Secondary Dressing: ABD Pad, 5x9 1 x Per Week/15 Days Discharge Instructions: Apply over primary dressing as directed. Compression Wrap: Kerlix Roll 4.5x3.1 (in/yd) (Generic) 1 x Per Week/15 Days Discharge Instructions: Apply Kerlix and Coban compression as directed. Compression Wrap: Coban Self-Adherent Wrap 4x5 (in/yd) (Generic) 1 x Per Week/15 Days Discharge Instructions: Apply over Kerlix as directed. Wound #35 - Foot Wound Laterality: Dorsal, Left, Lateral Cleanser: Wound Cleanser (Generic) 1 x Per Week/15 Days Discharge Instructions: Cleanse the wound with wound cleanser prior to applying a clean dressing using gauze sponges, not tissue or cotton balls. Cleanser: Soap and Water 1 x Per Week/15 Days Discharge Instructions: May shower and wash wound with dial antibacterial soap and water prior to dressing change. Peri-Wound Care: Sween Lotion (Moisturizing lotion) 1 x Per Week/15 Days Discharge Instructions: Apply moisturizing lotion as directed Prim Dressing: KerraCel Ag Gelling Fiber Dressing, 2x2 in (silver alginate) (Generic) 1 x Per Week/15 Days ary Discharge Instructions: Apply silver alginate to wound bed as instructed Secondary Dressing: Woven Gauze Sponge, Non-Sterile 4x4 in (Generic) 1 x Per Week/15 Days Discharge Instructions: Apply over primary dressing as directed. Secondary Dressing: ABD Pad, 5x9 1 x Per Week/15 Days Discharge Instructions: Apply over primary dressing as directed. Compression Wrap: Kerlix Roll 4.5x3.1 (in/yd) (Generic) 1 x Per Week/15 Days Discharge Instructions: Apply Kerlix and Coban compression as directed. Compression Wrap: Coban Self-Adherent Wrap 4x5 (in/yd) (Generic) 1 x Per Week/15 Days Discharge Instructions: Apply over Kerlix as directed. Electronic Signature(s) Signed: 07/05/2020 5:48:16 PM By: Linton Ham MD Signed: 07/06/2020 5:17:38 PM By:  [name] Entered By: [name] on 07/05/2020 11:49:59 -------------------------------------------------------------------------------- Problem List Details Patient Name: Date of Service: [name] [name] F. 07/05/2020 10:00 A M Medical Record Number: [id number] Patient Account Number: [id number] Date of Birth/Sex: Treating RN: October 22, 1945 (75 y.o. [name] Primary Care Provider: [name], [name] Other Clinician: Referring Provider: Treating Provider/Extender: Umar Patmon [name], [name] Weeks in Treatment: 220 Active Problems ICD-10 Encounter Code Description Active Date MDM Diagnosis E11.621 Type 2 diabetes mellitus with foot ulcer 04/17/2016 No Yes L89.613 Pressure ulcer of right heel, stage 3  Days Discharge Instructions: Apply Kerlix and Coban compression as directed. Com pression Wrap: Coban Self-Adherent Wrap 4x5 (in/yd) (Generic) 1 x Per Week/15 Days Discharge Instructions: Apply over Kerlix as directed. WOUND #34: - Foot Wound Laterality: Dorsal, Right, Medial Cleanser: Wound Cleanser (Generic) 1 x Per Week/15 Days Discharge Instructions: Cleanse the wound with wound cleanser prior to applying a clean dressing using gauze sponges, not tissue or cotton balls. Cleanser: Soap and Water 1 x Per Week/15 Days Discharge Instructions: May shower and wash wound with dial antibacterial soap and water prior to dressing change. Peri-Wound Care: Sween Lotion (Moisturizing lotion) 1 x Per Week/15 Days Discharge Instructions: Apply moisturizing lotion as directed Prim Dressing: KerraCel Ag Gelling Fiber Dressing, 4x5 in (silver alginate) (Generic) 1 x Per Week/15 Days ary Discharge Instructions: Apply silver alginate to wound bed as instructed Secondary Dressing: Woven Gauze Sponge, Non-Sterile 4x4 in (Generic) 1 x Per Week/15 Days Discharge Instructions: Apply over primary dressing as directed. Secondary Dressing: ABD Pad, 5x9 1 x Per Week/15 Days Discharge Instructions: Apply over primary dressing as directed. Com pression Wrap: Kerlix Roll 4.5x3.1 (in/yd) (Generic) 1 x Per Week/15 Days Discharge Instructions: Apply Kerlix and Coban  compression as directed. Com pression Wrap: Coban Self-Adherent Wrap 4x5 (in/yd) (Generic) 1 x Per Week/15 Days Discharge Instructions: Apply over Kerlix as directed. WOUND #35: - Foot Wound Laterality: Dorsal, Left, Lateral Cleanser: Wound Cleanser (Generic) 1 x Per Week/15 Days Discharge Instructions: Cleanse the wound with wound cleanser prior to applying a clean dressing using gauze sponges, not tissue or cotton balls. Cleanser: Soap and Water 1 x Per Week/15 Days Discharge Instructions: May shower and wash wound with dial antibacterial soap and water prior to dressing change. Peri-Wound Care: Sween Lotion (Moisturizing lotion) 1 x Per Week/15 Days Discharge Instructions: Apply moisturizing lotion as directed Prim Dressing: KerraCel Ag Gelling Fiber Dressing, 2x2 in (silver alginate) (Generic) 1 x Per Week/15 Days ary Discharge Instructions: Apply silver alginate to wound bed as instructed Secondary Dressing: Woven Gauze Sponge, Non-Sterile 4x4 in (Generic) 1 x Per Week/15 Days Discharge Instructions: Apply over primary dressing as directed. Secondary Dressing: ABD Pad, 5x9 1 x Per Week/15 Days Discharge Instructions: Apply over primary dressing as directed. Com pression Wrap: Kerlix Roll 4.5x3.1 (in/yd) (Generic) 1 x Per Week/15 Days Discharge Instructions: Apply Kerlix and Coban compression as directed. Com pression Wrap: Coban Self-Adherent Wrap 4x5 (in/yd) (Generic) 1 x Per Week/15 Days Discharge Instructions: Apply over Kerlix as directed. 1. I am going to await the MRI of the left foot. Previous x-rays were negative 2. If he does not have osteomyelitis I think he is going to need more aggressive compression. Chronic venous hypertension is not always accompanied by a major edema 3. I do not think he requires additional antibiotic Electronic Signature(s) Signed: 07/05/2020 5:48:16 PM By: Linton Ham MD Entered By: Linton Ham on 07/05/2020  12:42:00 -------------------------------------------------------------------------------- SuperBill Details Patient Name: Date of Service: [name] [name] F. 07/05/2020 Medical Record Number: [id number] Patient Account Number: [id number] Date of Birth/Sex: Treating RN: 04/11/1946 (75 y.o. 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 Secondary Dressing: ABD Pad, 5x9 1 x Per Week/15 Days Discharge Instructions: Apply over primary dressing as directed. Compression Wrap: Kerlix Roll 4.5x3.1 (in/yd) (Generic) 1 x Per Week/15 Days Discharge Instructions: Apply Kerlix and Coban compression as directed. Compression Wrap: Coban Self-Adherent Wrap 4x5 (in/yd) (Generic) 1 x Per Week/15 Days Discharge Instructions: Apply over Kerlix as directed. Wound #34 - Foot Wound Laterality: Dorsal, Right, Medial Cleanser: Wound Cleanser (Generic) 1 x Per Week/15 Days Discharge Instructions: Cleanse the wound with wound cleanser prior to applying a clean dressing using gauze sponges, not tissue or cotton balls. Cleanser: Soap and Water 1 x Per Week/15 Days Discharge Instructions: May shower and wash wound with dial antibacterial soap and water prior to dressing change. Peri-Wound Care: Sween Lotion (Moisturizing lotion) 1 x Per Week/15 Days Discharge Instructions: Apply moisturizing lotion as directed Prim Dressing: KerraCel Ag Gelling Fiber Dressing, 4x5 in (silver alginate) (Generic) 1 x Per Week/15 Days ary Discharge Instructions: Apply silver alginate to wound bed as instructed Secondary Dressing: Woven Gauze Sponge, Non-Sterile 4x4 in  (Generic) 1 x Per Week/15 Days Discharge Instructions: Apply over primary dressing as directed. Secondary Dressing: ABD Pad, 5x9 1 x Per Week/15 Days Discharge Instructions: Apply over primary dressing as directed. Compression Wrap: Kerlix Roll 4.5x3.1 (in/yd) (Generic) 1 x Per Week/15 Days Discharge Instructions: Apply Kerlix and Coban compression as directed. Compression Wrap: Coban Self-Adherent Wrap 4x5 (in/yd) (Generic) 1 x Per Week/15 Days Discharge Instructions: Apply over Kerlix as directed. Wound #35 - Foot Wound Laterality: Dorsal, Left, Lateral Cleanser: Wound Cleanser (Generic) 1 x Per Week/15 Days Discharge Instructions: Cleanse the wound with wound cleanser prior to applying a clean dressing using gauze sponges, not tissue or cotton balls. Cleanser: Soap and Water 1 x Per Week/15 Days Discharge Instructions: May shower and wash wound with dial antibacterial soap and water prior to dressing change. Peri-Wound Care: Sween Lotion (Moisturizing lotion) 1 x Per Week/15 Days Discharge Instructions: Apply moisturizing lotion as directed Prim Dressing: KerraCel Ag Gelling Fiber Dressing, 2x2 in (silver alginate) (Generic) 1 x Per Week/15 Days ary Discharge Instructions: Apply silver alginate to wound bed as instructed Secondary Dressing: Woven Gauze Sponge, Non-Sterile 4x4 in (Generic) 1 x Per Week/15 Days Discharge Instructions: Apply over primary dressing as directed. Secondary Dressing: ABD Pad, 5x9 1 x Per Week/15 Days Discharge Instructions: Apply over primary dressing as directed. Compression Wrap: Kerlix Roll 4.5x3.1 (in/yd) (Generic) 1 x Per Week/15 Days Discharge Instructions: Apply Kerlix and Coban compression as directed. Compression Wrap: Coban Self-Adherent Wrap 4x5 (in/yd) (Generic) 1 x Per Week/15 Days Discharge Instructions: Apply over Kerlix as directed. Electronic Signature(s) Signed: 07/05/2020 5:48:16 PM By: Linton Ham MD Signed: 07/06/2020 5:17:38 PM By:  [name] Entered By: [name] on 07/05/2020 11:49:59 -------------------------------------------------------------------------------- Problem List Details Patient Name: Date of Service: [name] [name] F. 07/05/2020 10:00 A M Medical Record Number: [id number] Patient Account Number: [id number] Date of Birth/Sex: Treating RN: October 22, 1945 (75 y.o. [name] Primary Care Provider: [name], [name] Other Clinician: Referring Provider: Treating Provider/Extender: Umar Patmon [name], [name] Weeks in Treatment: 220 Active Problems ICD-10 Encounter Code Description Active Date MDM Diagnosis E11.621 Type 2 diabetes mellitus with foot ulcer 04/17/2016 No Yes L89.613 Pressure ulcer of right heel, stage 3  2x2 in (silver alginate) (Generic) 1 x Per Week/15 Days ary Discharge Instructions: Apply silver alginate to wound bed as instructed Secondary Dressing: Woven Gauze Sponge, Non-Sterile 4x4 in (Generic) 1 x Per Week/15 Days Discharge Instructions: Apply over primary dressing as directed. Secondary Dressing: ABD Pad, 5x9 1 x Per Week/15 Days Discharge Instructions: Apply over primary dressing as directed. Compression Wrap: Kerlix Roll 4.5x3.1 (in/yd) (Generic) 1 x Per Week/15 Days Discharge Instructions: Apply Kerlix and Coban compression as directed. Compression Wrap: Coban Self-Adherent Wrap 4x5 (in/yd) (Generic) 1 x Per Week/15 Days Discharge Instructions: Apply over Kerlix as directed. Wound #34 - Foot Wound Laterality: Dorsal, Right, Medial Cleanser: Wound Cleanser (Generic) 1 x Per Week/15 Days Discharge Instructions: Cleanse the wound with wound cleanser prior to applying a clean dressing using gauze sponges, not tissue or cotton balls. Cleanser: Soap and Water 1 x Per Week/15 Days Discharge Instructions: May shower and wash wound with dial antibacterial soap and water prior to dressing change. Peri-Wound Care: Sween Lotion (Moisturizing lotion) 1 x Per Week/15 Days Discharge Instructions: Apply moisturizing lotion as directed Prim Dressing: KerraCel Ag Gelling Fiber Dressing, 4x5 in (silver alginate) (Generic) 1 x Per Week/15 Days ary Discharge Instructions: Apply silver alginate to wound bed as instructed Secondary Dressing: Woven Gauze Sponge, Non-Sterile 4x4 in  (Generic) 1 x Per Week/15 Days Discharge Instructions: Apply over primary dressing as directed. Secondary Dressing: ABD Pad, 5x9 1 x Per Week/15 Days Discharge Instructions: Apply over primary dressing as directed. Compression Wrap: Kerlix Roll 4.5x3.1 (in/yd) (Generic) 1 x Per Week/15 Days Discharge Instructions: Apply Kerlix and Coban compression as directed. Compression Wrap: Coban Self-Adherent Wrap 4x5 (in/yd) (Generic) 1 x Per Week/15 Days Discharge Instructions: Apply over Kerlix as directed. Wound #35 - Foot Wound Laterality: Dorsal, Left, Lateral Cleanser: Wound Cleanser (Generic) 1 x Per Week/15 Days Discharge Instructions: Cleanse the wound with wound cleanser prior to applying a clean dressing using gauze sponges, not tissue or cotton balls. Cleanser: Soap and Water 1 x Per Week/15 Days Discharge Instructions: May shower and wash wound with dial antibacterial soap and water prior to dressing change. Peri-Wound Care: Sween Lotion (Moisturizing lotion) 1 x Per Week/15 Days Discharge Instructions: Apply moisturizing lotion as directed Prim Dressing: KerraCel Ag Gelling Fiber Dressing, 2x2 in (silver alginate) (Generic) 1 x Per Week/15 Days ary Discharge Instructions: Apply silver alginate to wound bed as instructed Secondary Dressing: Woven Gauze Sponge, Non-Sterile 4x4 in (Generic) 1 x Per Week/15 Days Discharge Instructions: Apply over primary dressing as directed. Secondary Dressing: ABD Pad, 5x9 1 x Per Week/15 Days Discharge Instructions: Apply over primary dressing as directed. Compression Wrap: Kerlix Roll 4.5x3.1 (in/yd) (Generic) 1 x Per Week/15 Days Discharge Instructions: Apply Kerlix and Coban compression as directed. Compression Wrap: Coban Self-Adherent Wrap 4x5 (in/yd) (Generic) 1 x Per Week/15 Days Discharge Instructions: Apply over Kerlix as directed. Electronic Signature(s) Signed: 07/05/2020 5:48:16 PM By: Linton Ham MD Signed: 07/06/2020 5:17:38 PM By:  [name] Entered By: [name] on 07/05/2020 11:49:59 -------------------------------------------------------------------------------- Problem List Details Patient Name: Date of Service: [name] [name] F. 07/05/2020 10:00 A M Medical Record Number: [id number] Patient Account Number: [id number] Date of Birth/Sex: Treating RN: October 22, 1945 (75 y.o. [name] Primary Care Provider: [name], [name] Other Clinician: Referring Provider: Treating Provider/Extender: Umar Patmon [name], [name] Weeks in Treatment: 220 Active Problems ICD-10 Encounter Code Description Active Date MDM Diagnosis E11.621 Type 2 diabetes mellitus with foot ulcer 04/17/2016 No Yes L89.613 Pressure ulcer of right heel, stage 3  [name] [name], [name] [name] ([id number]) Visit Report for 07/05/2020 Debridement Details Patient Name: Date of Service: [name] [facility] F. 07/05/2020 10:00 A M Medical Record Number: [id number] Patient Account Number: [id number] Date of Birth/Sex: Treating RN: 1946-04-03 (75 y.o. [name], [name] Primary Care Provider: [name], [name] Other Clinician: Referring Provider: Treating Provider/Extender: Maebelle Sulton [name], [name] Weeks in Treatment: 220 Debridement Performed for Assessment: Wound #14 Left,Lateral Lower Leg Performed By: Physician [name], MD Debridement Type: Debridement Severity of Tissue Pre Debridement: Fat layer exposed Level of Consciousness (Pre-procedure): Awake and Alert Pre-procedure Verification/Time Out Yes - 11:42 Taken: Start Time: 11:42 Pain Control: Lidocaine T Area Debrided (L x W): otal 10 (cm) x 1.7 (cm) = 17 (cm) Tissue and other material debrided: Viable, Non-Viable, Eschar, Slough, Subcutaneous, Skin: Dermis , Skin: Epidermis, Slough Level: Skin/Subcutaneous Tissue Debridement Description: Excisional Instrument: Curette Bleeding: Minimum Hemostasis Achieved: Pressure End Time: 11:42 Procedural Pain: 0 Post Procedural Pain: 0 Response to Treatment: Procedure was tolerated well Level of Consciousness (Post- Awake and Alert procedure): Post Debridement Measurements of Total Wound Length: (cm) 10 Width: (cm) 1.7 Depth: (cm) 0.3 Volume: (cm) 4.006 Character of Wound/Ulcer Post Debridement: Improved Severity of Tissue Post Debridement: Fat layer exposed Post Procedure Diagnosis Same as Pre-procedure Electronic Signature(s) Signed: 07/05/2020 5:48:16 PM By: Linton Ham MD Signed: 07/06/2020 5:17:38 PM By: [name] Entered By: Linton Ham on 07/05/2020 12:42:31 -------------------------------------------------------------------------------- HPI Details Patient Name: Date of Service: [name] [name] F. 07/05/2020  10:00 A M Medical Record Number: [id number] Patient Account Number: [id number] Date of Birth/Sex: Treating RN: 09-10-1945 (75 y.o. [name] Primary Care Provider: [name], [name] Other Clinician: Referring Provider: Treating Provider/Extender: Garcia Dalzell [name], [name] Weeks in Treatment: 220 History of Present Illness Location: On the left and right lateral forefoot which has been there for about 6 months Quality: Patient reports No Pain. Severity: Patient states wound(s) are getting worse. Duration: Patient has had the wound for > 6 months prior to seeking treatment at the wound center Context: The wound would happen gradually Modifying Factors: Patient wound(s)/ulcer(s) are worsening due to :continual drainage from the wound ssociated Signs and Symptoms: Patient reports having increase discharge. A HPI Description: This patient returns after being seen here till the end of August and he was lost to follow-up. he has been quite debilitated laying in bed most of the time and his condition has deteriorated significantly. He has multiple ulcerations on the heel lateral forefoot and some of his toes. ======== Old notes: 75 year old male known to our practice when he was seen here in February and March and was lost to follow-up when he was admitted to hospital with various medical problems including coronary artery disease and a stroke. Now returns with the problem on the left forefoot where he has an ulceration and this has been there for about 6 months. 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 He has had persistent hyperglycemia given recently. Patient has a past medical history of diabetes mellitus, hypertension, combined systolic and diastolic heart failure, peripheral neuropathy, gout, cardiomyopathy with ejection fraction of  2x2 in (silver alginate) (Generic) 1 x Per Week/15 Days ary Discharge Instructions: Apply silver alginate to wound bed as instructed Secondary Dressing: Woven Gauze Sponge, Non-Sterile 4x4 in (Generic) 1 x Per Week/15 Days Discharge Instructions: Apply over primary dressing as directed. Secondary Dressing: ABD Pad, 5x9 1 x Per Week/15 Days Discharge Instructions: Apply over primary dressing as directed. Compression Wrap: Kerlix Roll 4.5x3.1 (in/yd) (Generic) 1 x Per Week/15 Days Discharge Instructions: Apply Kerlix and Coban compression as directed. Compression Wrap: Coban Self-Adherent Wrap 4x5 (in/yd) (Generic) 1 x Per Week/15 Days Discharge Instructions: Apply over Kerlix as directed. Wound #34 - Foot Wound Laterality: Dorsal, Right, Medial Cleanser: Wound Cleanser (Generic) 1 x Per Week/15 Days Discharge Instructions: Cleanse the wound with wound cleanser prior to applying a clean dressing using gauze sponges, not tissue or cotton balls. Cleanser: Soap and Water 1 x Per Week/15 Days Discharge Instructions: May shower and wash wound with dial antibacterial soap and water prior to dressing change. Peri-Wound Care: Sween Lotion (Moisturizing lotion) 1 x Per Week/15 Days Discharge Instructions: Apply moisturizing lotion as directed Prim Dressing: KerraCel Ag Gelling Fiber Dressing, 4x5 in (silver alginate) (Generic) 1 x Per Week/15 Days ary Discharge Instructions: Apply silver alginate to wound bed as instructed Secondary Dressing: Woven Gauze Sponge, Non-Sterile 4x4 in  (Generic) 1 x Per Week/15 Days Discharge Instructions: Apply over primary dressing as directed. Secondary Dressing: ABD Pad, 5x9 1 x Per Week/15 Days Discharge Instructions: Apply over primary dressing as directed. Compression Wrap: Kerlix Roll 4.5x3.1 (in/yd) (Generic) 1 x Per Week/15 Days Discharge Instructions: Apply Kerlix and Coban compression as directed. Compression Wrap: Coban Self-Adherent Wrap 4x5 (in/yd) (Generic) 1 x Per Week/15 Days Discharge Instructions: Apply over Kerlix as directed. Wound #35 - Foot Wound Laterality: Dorsal, Left, Lateral Cleanser: Wound Cleanser (Generic) 1 x Per Week/15 Days Discharge Instructions: Cleanse the wound with wound cleanser prior to applying a clean dressing using gauze sponges, not tissue or cotton balls. Cleanser: Soap and Water 1 x Per Week/15 Days Discharge Instructions: May shower and wash wound with dial antibacterial soap and water prior to dressing change. Peri-Wound Care: Sween Lotion (Moisturizing lotion) 1 x Per Week/15 Days Discharge Instructions: Apply moisturizing lotion as directed Prim Dressing: KerraCel Ag Gelling Fiber Dressing, 2x2 in (silver alginate) (Generic) 1 x Per Week/15 Days ary Discharge Instructions: Apply silver alginate to wound bed as instructed Secondary Dressing: Woven Gauze Sponge, Non-Sterile 4x4 in (Generic) 1 x Per Week/15 Days Discharge Instructions: Apply over primary dressing as directed. Secondary Dressing: ABD Pad, 5x9 1 x Per Week/15 Days Discharge Instructions: Apply over primary dressing as directed. Compression Wrap: Kerlix Roll 4.5x3.1 (in/yd) (Generic) 1 x Per Week/15 Days Discharge Instructions: Apply Kerlix and Coban compression as directed. Compression Wrap: Coban Self-Adherent Wrap 4x5 (in/yd) (Generic) 1 x Per Week/15 Days Discharge Instructions: Apply over Kerlix as directed. Electronic Signature(s) Signed: 07/05/2020 5:48:16 PM By: Linton Ham MD Signed: 07/06/2020 5:17:38 PM By:  [name] Entered By: [name] on 07/05/2020 11:49:59 -------------------------------------------------------------------------------- Problem List Details Patient Name: Date of Service: [name] [name] F. 07/05/2020 10:00 A M Medical Record Number: [id number] Patient Account Number: [id number] Date of Birth/Sex: Treating RN: October 22, 1945 (75 y.o. [name] Primary Care Provider: [name], [name] Other Clinician: Referring Provider: Treating Provider/Extender: Umar Patmon [name], [name] Weeks in Treatment: 220 Active Problems ICD-10 Encounter Code Description Active Date MDM Diagnosis E11.621 Type 2 diabetes mellitus with foot ulcer 04/17/2016 No Yes L89.613 Pressure ulcer of right heel, stage 3  2x2 in (silver alginate) (Generic) 1 x Per Week/15 Days ary Discharge Instructions: Apply silver alginate to wound bed as instructed Secondary Dressing: Woven Gauze Sponge, Non-Sterile 4x4 in (Generic) 1 x Per Week/15 Days Discharge Instructions: Apply over primary dressing as directed. Secondary Dressing: ABD Pad, 5x9 1 x Per Week/15 Days Discharge Instructions: Apply over primary dressing as directed. Compression Wrap: Kerlix Roll 4.5x3.1 (in/yd) (Generic) 1 x Per Week/15 Days Discharge Instructions: Apply Kerlix and Coban compression as directed. Compression Wrap: Coban Self-Adherent Wrap 4x5 (in/yd) (Generic) 1 x Per Week/15 Days Discharge Instructions: Apply over Kerlix as directed. Wound #34 - Foot Wound Laterality: Dorsal, Right, Medial Cleanser: Wound Cleanser (Generic) 1 x Per Week/15 Days Discharge Instructions: Cleanse the wound with wound cleanser prior to applying a clean dressing using gauze sponges, not tissue or cotton balls. Cleanser: Soap and Water 1 x Per Week/15 Days Discharge Instructions: May shower and wash wound with dial antibacterial soap and water prior to dressing change. Peri-Wound Care: Sween Lotion (Moisturizing lotion) 1 x Per Week/15 Days Discharge Instructions: Apply moisturizing lotion as directed Prim Dressing: KerraCel Ag Gelling Fiber Dressing, 4x5 in (silver alginate) (Generic) 1 x Per Week/15 Days ary Discharge Instructions: Apply silver alginate to wound bed as instructed Secondary Dressing: Woven Gauze Sponge, Non-Sterile 4x4 in  (Generic) 1 x Per Week/15 Days Discharge Instructions: Apply over primary dressing as directed. Secondary Dressing: ABD Pad, 5x9 1 x Per Week/15 Days Discharge Instructions: Apply over primary dressing as directed. Compression Wrap: Kerlix Roll 4.5x3.1 (in/yd) (Generic) 1 x Per Week/15 Days Discharge Instructions: Apply Kerlix and Coban compression as directed. Compression Wrap: Coban Self-Adherent Wrap 4x5 (in/yd) (Generic) 1 x Per Week/15 Days Discharge Instructions: Apply over Kerlix as directed. Wound #35 - Foot Wound Laterality: Dorsal, Left, Lateral Cleanser: Wound Cleanser (Generic) 1 x Per Week/15 Days Discharge Instructions: Cleanse the wound with wound cleanser prior to applying a clean dressing using gauze sponges, not tissue or cotton balls. Cleanser: Soap and Water 1 x Per Week/15 Days Discharge Instructions: May shower and wash wound with dial antibacterial soap and water prior to dressing change. Peri-Wound Care: Sween Lotion (Moisturizing lotion) 1 x Per Week/15 Days Discharge Instructions: Apply moisturizing lotion as directed Prim Dressing: KerraCel Ag Gelling Fiber Dressing, 2x2 in (silver alginate) (Generic) 1 x Per Week/15 Days ary Discharge Instructions: Apply silver alginate to wound bed as instructed Secondary Dressing: Woven Gauze Sponge, Non-Sterile 4x4 in (Generic) 1 x Per Week/15 Days Discharge Instructions: Apply over primary dressing as directed. Secondary Dressing: ABD Pad, 5x9 1 x Per Week/15 Days Discharge Instructions: Apply over primary dressing as directed. Compression Wrap: Kerlix Roll 4.5x3.1 (in/yd) (Generic) 1 x Per Week/15 Days Discharge Instructions: Apply Kerlix and Coban compression as directed. Compression Wrap: Coban Self-Adherent Wrap 4x5 (in/yd) (Generic) 1 x Per Week/15 Days Discharge Instructions: Apply over Kerlix as directed. Electronic Signature(s) Signed: 07/05/2020 5:48:16 PM By: Linton Ham MD Signed: 07/06/2020 5:17:38 PM By:  [name] Entered By: [name] on 07/05/2020 11:49:59 -------------------------------------------------------------------------------- Problem List Details Patient Name: Date of Service: [name] [name] F. 07/05/2020 10:00 A M Medical Record Number: [id number] Patient Account Number: [id number] Date of Birth/Sex: Treating RN: October 22, 1945 (75 y.o. [name] Primary Care Provider: [name], [name] Other Clinician: Referring Provider: Treating Provider/Extender: Umar Patmon [name], [name] Weeks in Treatment: 220 Active Problems ICD-10 Encounter Code Description Active Date MDM Diagnosis E11.621 Type 2 diabetes mellitus with foot ulcer 04/17/2016 No Yes L89.613 Pressure ulcer of right heel, stage 3  [name] [name], [name] [name] ([id number]) Visit Report for 07/05/2020 Debridement Details Patient Name: Date of Service: [name] [facility] F. 07/05/2020 10:00 A M Medical Record Number: [id number] Patient Account Number: [id number] Date of Birth/Sex: Treating RN: 1946-04-03 (75 y.o. [name], [name] Primary Care Provider: [name], [name] Other Clinician: Referring Provider: Treating Provider/Extender: Maebelle Sulton [name], [name] Weeks in Treatment: 220 Debridement Performed for Assessment: Wound #14 Left,Lateral Lower Leg Performed By: Physician [name], MD Debridement Type: Debridement Severity of Tissue Pre Debridement: Fat layer exposed Level of Consciousness (Pre-procedure): Awake and Alert Pre-procedure Verification/Time Out Yes - 11:42 Taken: Start Time: 11:42 Pain Control: Lidocaine T Area Debrided (L x W): otal 10 (cm) x 1.7 (cm) = 17 (cm) Tissue and other material debrided: Viable, Non-Viable, Eschar, Slough, Subcutaneous, Skin: Dermis , Skin: Epidermis, Slough Level: Skin/Subcutaneous Tissue Debridement Description: Excisional Instrument: Curette Bleeding: Minimum Hemostasis Achieved: Pressure End Time: 11:42 Procedural Pain: 0 Post Procedural Pain: 0 Response to Treatment: Procedure was tolerated well Level of Consciousness (Post- Awake and Alert procedure): Post Debridement Measurements of Total Wound Length: (cm) 10 Width: (cm) 1.7 Depth: (cm) 0.3 Volume: (cm) 4.006 Character of Wound/Ulcer Post Debridement: Improved Severity of Tissue Post Debridement: Fat layer exposed Post Procedure Diagnosis Same as Pre-procedure Electronic Signature(s) Signed: 07/05/2020 5:48:16 PM By: Linton Ham MD Signed: 07/06/2020 5:17:38 PM By: [name] Entered By: Linton Ham on 07/05/2020 12:42:31 -------------------------------------------------------------------------------- HPI Details Patient Name: Date of Service: [name] [name] F. 07/05/2020  10:00 A M Medical Record Number: [id number] Patient Account Number: [id number] Date of Birth/Sex: Treating RN: 09-10-1945 (75 y.o. [name] Primary Care Provider: [name], [name] Other Clinician: Referring Provider: Treating Provider/Extender: Garcia Dalzell [name], [name] Weeks in Treatment: 220 History of Present Illness Location: On the left and right lateral forefoot which has been there for about 6 months Quality: Patient reports No Pain. Severity: Patient states wound(s) are getting worse. Duration: Patient has had the wound for > 6 months prior to seeking treatment at the wound center Context: The wound would happen gradually Modifying Factors: Patient wound(s)/ulcer(s) are worsening due to :continual drainage from the wound ssociated Signs and Symptoms: Patient reports having increase discharge. A HPI Description: This patient returns after being seen here till the end of August and he was lost to follow-up. he has been quite debilitated laying in bed most of the time and his condition has deteriorated significantly. He has multiple ulcerations on the heel lateral forefoot and some of his toes. ======== Old notes: 75 year old male known to our practice when he was seen here in February and March and was lost to follow-up when he was admitted to hospital with various medical problems including coronary artery disease and a stroke. Now returns with the problem on the left forefoot where he has an ulceration and this has been there for about 6 months. 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 He has had persistent hyperglycemia given recently. Patient has a past medical history of diabetes mellitus, hypertension, combined systolic and diastolic heart failure, peripheral neuropathy, gout, cardiomyopathy with ejection fraction of  [name] [name], [name] [name] ([id number]) Visit Report for 07/05/2020 Debridement Details Patient Name: Date of Service: [name] [facility] F. 07/05/2020 10:00 A M Medical Record Number: [id number] Patient Account Number: [id number] Date of Birth/Sex: Treating RN: 1946-04-03 (75 y.o. [name], [name] Primary Care Provider: [name], [name] Other Clinician: Referring Provider: Treating Provider/Extender: Maebelle Sulton [name], [name] Weeks in Treatment: 220 Debridement Performed for Assessment: Wound #14 Left,Lateral Lower Leg Performed By: Physician [name], MD Debridement Type: Debridement Severity of Tissue Pre Debridement: Fat layer exposed Level of Consciousness (Pre-procedure): Awake and Alert Pre-procedure Verification/Time Out Yes - 11:42 Taken: Start Time: 11:42 Pain Control: Lidocaine T Area Debrided (L x W): otal 10 (cm) x 1.7 (cm) = 17 (cm) Tissue and other material debrided: Viable, Non-Viable, Eschar, Slough, Subcutaneous, Skin: Dermis , Skin: Epidermis, Slough Level: Skin/Subcutaneous Tissue Debridement Description: Excisional Instrument: Curette Bleeding: Minimum Hemostasis Achieved: Pressure End Time: 11:42 Procedural Pain: 0 Post Procedural Pain: 0 Response to Treatment: Procedure was tolerated well Level of Consciousness (Post- Awake and Alert procedure): Post Debridement Measurements of Total Wound Length: (cm) 10 Width: (cm) 1.7 Depth: (cm) 0.3 Volume: (cm) 4.006 Character of Wound/Ulcer Post Debridement: Improved Severity of Tissue Post Debridement: Fat layer exposed Post Procedure Diagnosis Same as Pre-procedure Electronic Signature(s) Signed: 07/05/2020 5:48:16 PM By: Linton Ham MD Signed: 07/06/2020 5:17:38 PM By: [name] Entered By: Linton Ham on 07/05/2020 12:42:31 -------------------------------------------------------------------------------- HPI Details Patient Name: Date of Service: [name] [name] F. 07/05/2020  10:00 A M Medical Record Number: [id number] Patient Account Number: [id number] Date of Birth/Sex: Treating RN: 09-10-1945 (75 y.o. [name] Primary Care Provider: [name], [name] Other Clinician: Referring Provider: Treating Provider/Extender: Garcia Dalzell [name], [name] Weeks in Treatment: 220 History of Present Illness Location: On the left and right lateral forefoot which has been there for about 6 months Quality: Patient reports No Pain. Severity: Patient states wound(s) are getting worse. Duration: Patient has had the wound for > 6 months prior to seeking treatment at the wound center Context: The wound would happen gradually Modifying Factors: Patient wound(s)/ulcer(s) are worsening due to :continual drainage from the wound ssociated Signs and Symptoms: Patient reports having increase discharge. A HPI Description: This patient returns after being seen here till the end of August and he was lost to follow-up. he has been quite debilitated laying in bed most of the time and his condition has deteriorated significantly. He has multiple ulcerations on the heel lateral forefoot and some of his toes. ======== Old notes: 75 year old male known to our practice when he was seen here in February and March and was lost to follow-up when he was admitted to hospital with various medical problems including coronary artery disease and a stroke. Now returns with the problem on the left forefoot where he has an ulceration and this has been there for about 6 months. 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 He has had persistent hyperglycemia given recently. Patient has a past medical history of diabetes mellitus, hypertension, combined systolic and diastolic heart failure, peripheral neuropathy, gout, cardiomyopathy with ejection fraction of  2x2 in (silver alginate) (Generic) 1 x Per Week/15 Days ary Discharge Instructions: Apply silver alginate to wound bed as instructed Secondary Dressing: Woven Gauze Sponge, Non-Sterile 4x4 in (Generic) 1 x Per Week/15 Days Discharge Instructions: Apply over primary dressing as directed. Secondary Dressing: ABD Pad, 5x9 1 x Per Week/15 Days Discharge Instructions: Apply over primary dressing as directed. Compression Wrap: Kerlix Roll 4.5x3.1 (in/yd) (Generic) 1 x Per Week/15 Days Discharge Instructions: Apply Kerlix and Coban compression as directed. Compression Wrap: Coban Self-Adherent Wrap 4x5 (in/yd) (Generic) 1 x Per Week/15 Days Discharge Instructions: Apply over Kerlix as directed. Wound #34 - Foot Wound Laterality: Dorsal, Right, Medial Cleanser: Wound Cleanser (Generic) 1 x Per Week/15 Days Discharge Instructions: Cleanse the wound with wound cleanser prior to applying a clean dressing using gauze sponges, not tissue or cotton balls. Cleanser: Soap and Water 1 x Per Week/15 Days Discharge Instructions: May shower and wash wound with dial antibacterial soap and water prior to dressing change. Peri-Wound Care: Sween Lotion (Moisturizing lotion) 1 x Per Week/15 Days Discharge Instructions: Apply moisturizing lotion as directed Prim Dressing: KerraCel Ag Gelling Fiber Dressing, 4x5 in (silver alginate) (Generic) 1 x Per Week/15 Days ary Discharge Instructions: Apply silver alginate to wound bed as instructed Secondary Dressing: Woven Gauze Sponge, Non-Sterile 4x4 in  (Generic) 1 x Per Week/15 Days Discharge Instructions: Apply over primary dressing as directed. Secondary Dressing: ABD Pad, 5x9 1 x Per Week/15 Days Discharge Instructions: Apply over primary dressing as directed. Compression Wrap: Kerlix Roll 4.5x3.1 (in/yd) (Generic) 1 x Per Week/15 Days Discharge Instructions: Apply Kerlix and Coban compression as directed. Compression Wrap: Coban Self-Adherent Wrap 4x5 (in/yd) (Generic) 1 x Per Week/15 Days Discharge Instructions: Apply over Kerlix as directed. Wound #35 - Foot Wound Laterality: Dorsal, Left, Lateral Cleanser: Wound Cleanser (Generic) 1 x Per Week/15 Days Discharge Instructions: Cleanse the wound with wound cleanser prior to applying a clean dressing using gauze sponges, not tissue or cotton balls. Cleanser: Soap and Water 1 x Per Week/15 Days Discharge Instructions: May shower and wash wound with dial antibacterial soap and water prior to dressing change. Peri-Wound Care: Sween Lotion (Moisturizing lotion) 1 x Per Week/15 Days Discharge Instructions: Apply moisturizing lotion as directed Prim Dressing: KerraCel Ag Gelling Fiber Dressing, 2x2 in (silver alginate) (Generic) 1 x Per Week/15 Days ary Discharge Instructions: Apply silver alginate to wound bed as instructed Secondary Dressing: Woven Gauze Sponge, Non-Sterile 4x4 in (Generic) 1 x Per Week/15 Days Discharge Instructions: Apply over primary dressing as directed. Secondary Dressing: ABD Pad, 5x9 1 x Per Week/15 Days Discharge Instructions: Apply over primary dressing as directed. Compression Wrap: Kerlix Roll 4.5x3.1 (in/yd) (Generic) 1 x Per Week/15 Days Discharge Instructions: Apply Kerlix and Coban compression as directed. Compression Wrap: Coban Self-Adherent Wrap 4x5 (in/yd) (Generic) 1 x Per Week/15 Days Discharge Instructions: Apply over Kerlix as directed. Electronic Signature(s) Signed: 07/05/2020 5:48:16 PM By: Linton Ham MD Signed: 07/06/2020 5:17:38 PM By:  [name] Entered By: [name] on 07/05/2020 11:49:59 -------------------------------------------------------------------------------- Problem List Details Patient Name: Date of Service: [name] [name] F. 07/05/2020 10:00 A M Medical Record Number: [id number] Patient Account Number: [id number] Date of Birth/Sex: Treating RN: October 22, 1945 (75 y.o. [name] Primary Care Provider: [name], [name] Other Clinician: Referring Provider: Treating Provider/Extender: Umar Patmon [name], [name] Weeks in Treatment: 220 Active Problems ICD-10 Encounter Code Description Active Date MDM Diagnosis E11.621 Type 2 diabetes mellitus with foot ulcer 04/17/2016 No Yes L89.613 Pressure ulcer of right heel, stage 3  Days Discharge Instructions: Apply Kerlix and Coban compression as directed. Com pression Wrap: Coban Self-Adherent Wrap 4x5 (in/yd) (Generic) 1 x Per Week/15 Days Discharge Instructions: Apply over Kerlix as directed. WOUND #34: - Foot Wound Laterality: Dorsal, Right, Medial Cleanser: Wound Cleanser (Generic) 1 x Per Week/15 Days Discharge Instructions: Cleanse the wound with wound cleanser prior to applying a clean dressing using gauze sponges, not tissue or cotton balls. Cleanser: Soap and Water 1 x Per Week/15 Days Discharge Instructions: May shower and wash wound with dial antibacterial soap and water prior to dressing change. Peri-Wound Care: Sween Lotion (Moisturizing lotion) 1 x Per Week/15 Days Discharge Instructions: Apply moisturizing lotion as directed Prim Dressing: KerraCel Ag Gelling Fiber Dressing, 4x5 in (silver alginate) (Generic) 1 x Per Week/15 Days ary Discharge Instructions: Apply silver alginate to wound bed as instructed Secondary Dressing: Woven Gauze Sponge, Non-Sterile 4x4 in (Generic) 1 x Per Week/15 Days Discharge Instructions: Apply over primary dressing as directed. Secondary Dressing: ABD Pad, 5x9 1 x Per Week/15 Days Discharge Instructions: Apply over primary dressing as directed. Com pression Wrap: Kerlix Roll 4.5x3.1 (in/yd) (Generic) 1 x Per Week/15 Days Discharge Instructions: Apply Kerlix and Coban  compression as directed. Com pression Wrap: Coban Self-Adherent Wrap 4x5 (in/yd) (Generic) 1 x Per Week/15 Days Discharge Instructions: Apply over Kerlix as directed. WOUND #35: - Foot Wound Laterality: Dorsal, Left, Lateral Cleanser: Wound Cleanser (Generic) 1 x Per Week/15 Days Discharge Instructions: Cleanse the wound with wound cleanser prior to applying a clean dressing using gauze sponges, not tissue or cotton balls. Cleanser: Soap and Water 1 x Per Week/15 Days Discharge Instructions: May shower and wash wound with dial antibacterial soap and water prior to dressing change. Peri-Wound Care: Sween Lotion (Moisturizing lotion) 1 x Per Week/15 Days Discharge Instructions: Apply moisturizing lotion as directed Prim Dressing: KerraCel Ag Gelling Fiber Dressing, 2x2 in (silver alginate) (Generic) 1 x Per Week/15 Days ary Discharge Instructions: Apply silver alginate to wound bed as instructed Secondary Dressing: Woven Gauze Sponge, Non-Sterile 4x4 in (Generic) 1 x Per Week/15 Days Discharge Instructions: Apply over primary dressing as directed. Secondary Dressing: ABD Pad, 5x9 1 x Per Week/15 Days Discharge Instructions: Apply over primary dressing as directed. Com pression Wrap: Kerlix Roll 4.5x3.1 (in/yd) (Generic) 1 x Per Week/15 Days Discharge Instructions: Apply Kerlix and Coban compression as directed. Com pression Wrap: Coban Self-Adherent Wrap 4x5 (in/yd) (Generic) 1 x Per Week/15 Days Discharge Instructions: Apply over Kerlix as directed. 1. I am going to await the MRI of the left foot. Previous x-rays were negative 2. If he does not have osteomyelitis I think he is going to need more aggressive compression. Chronic venous hypertension is not always accompanied by a major edema 3. I do not think he requires additional antibiotic Electronic Signature(s) Signed: 07/05/2020 5:48:16 PM By: Linton Ham MD Entered By: Linton Ham on 07/05/2020  12:42:00 -------------------------------------------------------------------------------- SuperBill Details Patient Name: Date of Service: [name] [name] F. 07/05/2020 Medical Record Number: [id number] Patient Account Number: [id number] Date of Birth/Sex: Treating RN: 04/11/1946 (75 y.o. 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 07/05/2020 10:00 A M Medical Record Number: [id number] Patient Account Number: [id number] Date of Birth/Sex: Treating RN: 1946-04-03 (75 y.o. [name], [name] Primary Care Provider: [name], [name] Other Clinician: Referring Provider: Treating Provider/Extender: Maebelle Sulton [name], [name] Weeks in Treatment: 220 Debridement Performed for Assessment: Wound #14 Left,Lateral Lower Leg Performed By: Physician [name], MD Debridement Type: Debridement Severity of Tissue Pre Debridement: Fat layer exposed Level of Consciousness (Pre-procedure): Awake and Alert Pre-procedure Verification/Time Out Yes - 11:42 Taken: Start Time: 11:42 Pain Control: Lidocaine T Area Debrided (L x W): otal 10 (cm) x 1.7 (cm) = 17 (cm) Tissue and other material debrided: Viable, Non-Viable, Eschar, Slough, Subcutaneous, Skin: Dermis , Skin: Epidermis, Slough Level: Skin/Subcutaneous Tissue Debridement Description: Excisional Instrument: Curette Bleeding: Minimum Hemostasis Achieved: Pressure End Time: 11:42 Procedural Pain: 0 Post Procedural Pain: 0 Response to Treatment: Procedure was tolerated well Level of Consciousness (Post- Awake and Alert procedure): Post Debridement Measurements of Total Wound Length: (cm) 10 Width: (cm) 1.7 Depth: (cm) 0.3 Volume: (cm) 4.006 Character of Wound/Ulcer Post Debridement: Improved Severity of Tissue Post Debridement: Fat layer exposed Post Procedure Diagnosis Same as Pre-procedure Electronic Signature(s) Signed: 07/05/2020 5:48:16 PM By: Linton Ham MD Signed: 07/06/2020 5:17:38 PM By: [name] Entered By: Linton Ham on 07/05/2020 12:42:31 -------------------------------------------------------------------------------- HPI Details Patient Name: Date of Service: [name] [name] F. 07/05/2020  10:00 A M Medical Record Number: [id number] Patient Account Number: [id number] Date of Birth/Sex: Treating RN: 09-10-1945 (75 y.o. [name] Primary Care Provider: [name], [name] Other Clinician: Referring Provider: Treating Provider/Extender: Garcia Dalzell [name], [name] Weeks in Treatment: 220 History of Present Illness Location: On the left and right lateral forefoot which has been there for about 6 months Quality: Patient reports No Pain. Severity: Patient states wound(s) are getting worse. Duration: Patient has had the wound for > 6 months prior to seeking treatment at the wound center Context: The wound would happen gradually Modifying Factors: Patient wound(s)/ulcer(s) are worsening due to :continual drainage from the wound ssociated Signs and Symptoms: Patient reports having increase discharge. A HPI Description: This patient returns after being seen here till the end of August and he was lost to follow-up. he has been quite debilitated laying in bed most of the time and his condition has deteriorated significantly. He has multiple ulcerations on the heel lateral forefoot and some of his toes. ======== Old notes: 75 year old male known to our practice when he was seen here in February and March and was lost to follow-up when he was admitted to hospital with various medical problems including coronary artery disease and a stroke. Now returns with the problem on the left forefoot where he has an ulceration and this has been there for about 6 months. 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 He has had persistent hyperglycemia given recently. Patient has a past medical history of diabetes mellitus, hypertension, combined systolic and diastolic heart failure, peripheral neuropathy, gout, cardiomyopathy with ejection fraction of  [name] [name], [name] [name] ([id number]) Visit Report for 07/05/2020 Debridement Details Patient Name: Date of Service: [name] [facility] F. 07/05/2020 10:00 A M Medical Record Number: [id number] Patient Account Number: [id number] Date of Birth/Sex: Treating RN: 1946-04-03 (75 y.o. [name], [name] Primary Care Provider: [name], [name] Other Clinician: Referring Provider: Treating Provider/Extender: Maebelle Sulton [name], [name] Weeks in Treatment: 220 Debridement Performed for Assessment: Wound #14 Left,Lateral Lower Leg Performed By: Physician [name], MD Debridement Type: Debridement Severity of Tissue Pre Debridement: Fat layer exposed Level of Consciousness (Pre-procedure): Awake and Alert Pre-procedure Verification/Time Out Yes - 11:42 Taken: Start Time: 11:42 Pain Control: Lidocaine T Area Debrided (L x W): otal 10 (cm) x 1.7 (cm) = 17 (cm) Tissue and other material debrided: Viable, Non-Viable, Eschar, Slough, Subcutaneous, Skin: Dermis , Skin: Epidermis, Slough Level: Skin/Subcutaneous Tissue Debridement Description: Excisional Instrument: Curette Bleeding: Minimum Hemostasis Achieved: Pressure End Time: 11:42 Procedural Pain: 0 Post Procedural Pain: 0 Response to Treatment: Procedure was tolerated well Level of Consciousness (Post- Awake and Alert procedure): Post Debridement Measurements of Total Wound Length: (cm) 10 Width: (cm) 1.7 Depth: (cm) 0.3 Volume: (cm) 4.006 Character of Wound/Ulcer Post Debridement: Improved Severity of Tissue Post Debridement: Fat layer exposed Post Procedure Diagnosis Same as Pre-procedure Electronic Signature(s) Signed: 07/05/2020 5:48:16 PM By: Linton Ham MD Signed: 07/06/2020 5:17:38 PM By: [name] Entered By: Linton Ham on 07/05/2020 12:42:31 -------------------------------------------------------------------------------- HPI Details Patient Name: Date of Service: [name] [name] F. 07/05/2020  10:00 A M Medical Record Number: [id number] Patient Account Number: [id number] Date of Birth/Sex: Treating RN: 09-10-1945 (75 y.o. [name] Primary Care Provider: [name], [name] Other Clinician: Referring Provider: Treating Provider/Extender: Garcia Dalzell [name], [name] Weeks in Treatment: 220 History of Present Illness Location: On the left and right lateral forefoot which has been there for about 6 months Quality: Patient reports No Pain. Severity: Patient states wound(s) are getting worse. Duration: Patient has had the wound for > 6 months prior to seeking treatment at the wound center Context: The wound would happen gradually Modifying Factors: Patient wound(s)/ulcer(s) are worsening due to :continual drainage from the wound ssociated Signs and Symptoms: Patient reports having increase discharge. A HPI Description: This patient returns after being seen here till the end of August and he was lost to follow-up. he has been quite debilitated laying in bed most of the time and his condition has deteriorated significantly. He has multiple ulcerations on the heel lateral forefoot and some of his toes. ======== Old notes: 75 year old male known to our practice when he was seen here in February and March and was lost to follow-up when he was admitted to hospital with various medical problems including coronary artery disease and a stroke. Now returns with the problem on the left forefoot where he has an ulceration and this has been there for about 6 months. 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 He has had persistent hyperglycemia given recently. Patient has a past medical history of diabetes mellitus, hypertension, combined systolic and diastolic heart failure, peripheral neuropathy, gout, cardiomyopathy with ejection fraction of  [name] [name], [name] [name] ([id number]) Visit Report for 07/05/2020 Debridement Details Patient Name: Date of Service: [name] [facility] F. 07/05/2020 10:00 A M Medical Record Number: [id number] Patient Account Number: [id number] Date of Birth/Sex: Treating RN: 1946-04-03 (75 y.o. [name], [name] Primary Care Provider: [name], [name] Other Clinician: Referring Provider: Treating Provider/Extender: Maebelle Sulton [name], [name] Weeks in Treatment: 220 Debridement Performed for Assessment: Wound #14 Left,Lateral Lower Leg Performed By: Physician [name], MD Debridement Type: Debridement Severity of Tissue Pre Debridement: Fat layer exposed Level of Consciousness (Pre-procedure): Awake and Alert Pre-procedure Verification/Time Out Yes - 11:42 Taken: Start Time: 11:42 Pain Control: Lidocaine T Area Debrided (L x W): otal 10 (cm) x 1.7 (cm) = 17 (cm) Tissue and other material debrided: Viable, Non-Viable, Eschar, Slough, Subcutaneous, Skin: Dermis , Skin: Epidermis, Slough Level: Skin/Subcutaneous Tissue Debridement Description: Excisional Instrument: Curette Bleeding: Minimum Hemostasis Achieved: Pressure End Time: 11:42 Procedural Pain: 0 Post Procedural Pain: 0 Response to Treatment: Procedure was tolerated well Level of Consciousness (Post- Awake and Alert procedure): Post Debridement Measurements of Total Wound Length: (cm) 10 Width: (cm) 1.7 Depth: (cm) 0.3 Volume: (cm) 4.006 Character of Wound/Ulcer Post Debridement: Improved Severity of Tissue Post Debridement: Fat layer exposed Post Procedure Diagnosis Same as Pre-procedure Electronic Signature(s) Signed: 07/05/2020 5:48:16 PM By: Linton Ham MD Signed: 07/06/2020 5:17:38 PM By: [name] Entered By: Linton Ham on 07/05/2020 12:42:31 -------------------------------------------------------------------------------- HPI Details Patient Name: Date of Service: [name] [name] F. 07/05/2020  10:00 A M Medical Record Number: [id number] Patient Account Number: [id number] Date of Birth/Sex: Treating RN: 09-10-1945 (75 y.o. [name] Primary Care Provider: [name], [name] Other Clinician: Referring Provider: Treating Provider/Extender: Garcia Dalzell [name], [name] Weeks in Treatment: 220 History of Present Illness Location: On the left and right lateral forefoot which has been there for about 6 months Quality: Patient reports No Pain. Severity: Patient states wound(s) are getting worse. Duration: Patient has had the wound for > 6 months prior to seeking treatment at the wound center Context: The wound would happen gradually Modifying Factors: Patient wound(s)/ulcer(s) are worsening due to :continual drainage from the wound ssociated Signs and Symptoms: Patient reports having increase discharge. A HPI Description: This patient returns after being seen here till the end of August and he was lost to follow-up. he has been quite debilitated laying in bed most of the time and his condition has deteriorated significantly. He has multiple ulcerations on the heel lateral forefoot and some of his toes. ======== Old notes: 75 year old male known to our practice when he was seen here in February and March and was lost to follow-up when he was admitted to hospital with various medical problems including coronary artery disease and a stroke. Now returns with the problem on the left forefoot where he has an ulceration and this has been there for about 6 months. 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 He has had persistent hyperglycemia given recently. Patient has a past medical history of diabetes mellitus, hypertension, combined systolic and diastolic heart failure, peripheral neuropathy, gout, cardiomyopathy with ejection fraction of  2x2 in (silver alginate) (Generic) 1 x Per Week/15 Days ary Discharge Instructions: Apply silver alginate to wound bed as instructed Secondary Dressing: Woven Gauze Sponge, Non-Sterile 4x4 in (Generic) 1 x Per Week/15 Days Discharge Instructions: Apply over primary dressing as directed. Secondary Dressing: ABD Pad, 5x9 1 x Per Week/15 Days Discharge Instructions: Apply over primary dressing as directed. Compression Wrap: Kerlix Roll 4.5x3.1 (in/yd) (Generic) 1 x Per Week/15 Days Discharge Instructions: Apply Kerlix and Coban compression as directed. Compression Wrap: Coban Self-Adherent Wrap 4x5 (in/yd) (Generic) 1 x Per Week/15 Days Discharge Instructions: Apply over Kerlix as directed. Wound #34 - Foot Wound Laterality: Dorsal, Right, Medial Cleanser: Wound Cleanser (Generic) 1 x Per Week/15 Days Discharge Instructions: Cleanse the wound with wound cleanser prior to applying a clean dressing using gauze sponges, not tissue or cotton balls. Cleanser: Soap and Water 1 x Per Week/15 Days Discharge Instructions: May shower and wash wound with dial antibacterial soap and water prior to dressing change. Peri-Wound Care: Sween Lotion (Moisturizing lotion) 1 x Per Week/15 Days Discharge Instructions: Apply moisturizing lotion as directed Prim Dressing: KerraCel Ag Gelling Fiber Dressing, 4x5 in (silver alginate) (Generic) 1 x Per Week/15 Days ary Discharge Instructions: Apply silver alginate to wound bed as instructed Secondary Dressing: Woven Gauze Sponge, Non-Sterile 4x4 in  (Generic) 1 x Per Week/15 Days Discharge Instructions: Apply over primary dressing as directed. Secondary Dressing: ABD Pad, 5x9 1 x Per Week/15 Days Discharge Instructions: Apply over primary dressing as directed. Compression Wrap: Kerlix Roll 4.5x3.1 (in/yd) (Generic) 1 x Per Week/15 Days Discharge Instructions: Apply Kerlix and Coban compression as directed. Compression Wrap: Coban Self-Adherent Wrap 4x5 (in/yd) (Generic) 1 x Per Week/15 Days Discharge Instructions: Apply over Kerlix as directed. Wound #35 - Foot Wound Laterality: Dorsal, Left, Lateral Cleanser: Wound Cleanser (Generic) 1 x Per Week/15 Days Discharge Instructions: Cleanse the wound with wound cleanser prior to applying a clean dressing using gauze sponges, not tissue or cotton balls. Cleanser: Soap and Water 1 x Per Week/15 Days Discharge Instructions: May shower and wash wound with dial antibacterial soap and water prior to dressing change. Peri-Wound Care: Sween Lotion (Moisturizing lotion) 1 x Per Week/15 Days Discharge Instructions: Apply moisturizing lotion as directed Prim Dressing: KerraCel Ag Gelling Fiber Dressing, 2x2 in (silver alginate) (Generic) 1 x Per Week/15 Days ary Discharge Instructions: Apply silver alginate to wound bed as instructed Secondary Dressing: Woven Gauze Sponge, Non-Sterile 4x4 in (Generic) 1 x Per Week/15 Days Discharge Instructions: Apply over primary dressing as directed. Secondary Dressing: ABD Pad, 5x9 1 x Per Week/15 Days Discharge Instructions: Apply over primary dressing as directed. Compression Wrap: Kerlix Roll 4.5x3.1 (in/yd) (Generic) 1 x Per Week/15 Days Discharge Instructions: Apply Kerlix and Coban compression as directed. Compression Wrap: Coban Self-Adherent Wrap 4x5 (in/yd) (Generic) 1 x Per Week/15 Days Discharge Instructions: Apply over Kerlix as directed. Electronic Signature(s) Signed: 07/05/2020 5:48:16 PM By: Linton Ham MD Signed: 07/06/2020 5:17:38 PM By:  [name] Entered By: [name] on 07/05/2020 11:49:59 -------------------------------------------------------------------------------- Problem List Details Patient Name: Date of Service: [name] [name] F. 07/05/2020 10:00 A M Medical Record Number: [id number] Patient Account Number: [id number] Date of Birth/Sex: Treating RN: October 22, 1945 (75 y.o. [name] Primary Care Provider: [name], [name] Other Clinician: Referring Provider: Treating Provider/Extender: Umar Patmon [name], [name] Weeks in Treatment: 220 Active Problems ICD-10 Encounter Code Description Active Date MDM Diagnosis E11.621 Type 2 diabetes mellitus with foot ulcer 04/17/2016 No Yes L89.613 Pressure ulcer of right heel, stage 3  Days Discharge Instructions: Apply Kerlix and Coban compression as directed. Com pression Wrap: Coban Self-Adherent Wrap 4x5 (in/yd) (Generic) 1 x Per Week/15 Days Discharge Instructions: Apply over Kerlix as directed. WOUND #34: - Foot Wound Laterality: Dorsal, Right, Medial Cleanser: Wound Cleanser (Generic) 1 x Per Week/15 Days Discharge Instructions: Cleanse the wound with wound cleanser prior to applying a clean dressing using gauze sponges, not tissue or cotton balls. Cleanser: Soap and Water 1 x Per Week/15 Days Discharge Instructions: May shower and wash wound with dial antibacterial soap and water prior to dressing change. Peri-Wound Care: Sween Lotion (Moisturizing lotion) 1 x Per Week/15 Days Discharge Instructions: Apply moisturizing lotion as directed Prim Dressing: KerraCel Ag Gelling Fiber Dressing, 4x5 in (silver alginate) (Generic) 1 x Per Week/15 Days ary Discharge Instructions: Apply silver alginate to wound bed as instructed Secondary Dressing: Woven Gauze Sponge, Non-Sterile 4x4 in (Generic) 1 x Per Week/15 Days Discharge Instructions: Apply over primary dressing as directed. Secondary Dressing: ABD Pad, 5x9 1 x Per Week/15 Days Discharge Instructions: Apply over primary dressing as directed. Com pression Wrap: Kerlix Roll 4.5x3.1 (in/yd) (Generic) 1 x Per Week/15 Days Discharge Instructions: Apply Kerlix and Coban  compression as directed. Com pression Wrap: Coban Self-Adherent Wrap 4x5 (in/yd) (Generic) 1 x Per Week/15 Days Discharge Instructions: Apply over Kerlix as directed. WOUND #35: - Foot Wound Laterality: Dorsal, Left, Lateral Cleanser: Wound Cleanser (Generic) 1 x Per Week/15 Days Discharge Instructions: Cleanse the wound with wound cleanser prior to applying a clean dressing using gauze sponges, not tissue or cotton balls. Cleanser: Soap and Water 1 x Per Week/15 Days Discharge Instructions: May shower and wash wound with dial antibacterial soap and water prior to dressing change. Peri-Wound Care: Sween Lotion (Moisturizing lotion) 1 x Per Week/15 Days Discharge Instructions: Apply moisturizing lotion as directed Prim Dressing: KerraCel Ag Gelling Fiber Dressing, 2x2 in (silver alginate) (Generic) 1 x Per Week/15 Days ary Discharge Instructions: Apply silver alginate to wound bed as instructed Secondary Dressing: Woven Gauze Sponge, Non-Sterile 4x4 in (Generic) 1 x Per Week/15 Days Discharge Instructions: Apply over primary dressing as directed. Secondary Dressing: ABD Pad, 5x9 1 x Per Week/15 Days Discharge Instructions: Apply over primary dressing as directed. Com pression Wrap: Kerlix Roll 4.5x3.1 (in/yd) (Generic) 1 x Per Week/15 Days Discharge Instructions: Apply Kerlix and Coban compression as directed. Com pression Wrap: Coban Self-Adherent Wrap 4x5 (in/yd) (Generic) 1 x Per Week/15 Days Discharge Instructions: Apply over Kerlix as directed. 1. I am going to await the MRI of the left foot. Previous x-rays were negative 2. If he does not have osteomyelitis I think he is going to need more aggressive compression. Chronic venous hypertension is not always accompanied by a major edema 3. I do not think he requires additional antibiotic Electronic Signature(s) Signed: 07/05/2020 5:48:16 PM By: Linton Ham MD Entered By: Linton Ham on 07/05/2020  12:42:00 -------------------------------------------------------------------------------- SuperBill Details Patient Name: Date of Service: [name] [name] F. 07/05/2020 Medical Record Number: [id number] Patient Account Number: [id number] Date of Birth/Sex: Treating RN: 04/11/1946 (75 y.o. 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 Com pression Wrap: Coban Self-Adherent Wrap 4x5 (in/yd) (Generic) 1 x Per Week/15 Days Discharge Instructions: Apply over Kerlix as directed. WOUND #34: - Foot Wound Laterality: Dorsal, Right, Medial Cleanser: Wound Cleanser (Generic) 1 x Per Week/15 Days Discharge Instructions: Cleanse the wound with wound cleanser prior to applying a clean dressing using gauze sponges, not tissue or cotton balls. Cleanser: Soap and Water 1 x Per Week/15 Days Discharge Instructions: May shower and wash wound with dial antibacterial soap and water prior to dressing change. Peri-Wound Care: Sween Lotion (Moisturizing lotion) 1 x Per Week/15 Days Discharge Instructions: Apply moisturizing lotion as directed Prim Dressing: KerraCel Ag Gelling Fiber Dressing, 4x5 in (silver alginate) (Generic) 1 x Per Week/15 Days ary Discharge Instructions: Apply silver alginate to wound bed as instructed Secondary Dressing: Woven Gauze Sponge, Non-Sterile 4x4 in (Generic) 1 x Per Week/15 Days Discharge Instructions: Apply over primary dressing as directed. Secondary Dressing: ABD Pad, 5x9 1 x Per Week/15 Days Discharge Instructions: Apply over primary dressing as directed. Com pression Wrap: Kerlix Roll 4.5x3.1 (in/yd) (Generic) 1 x Per Week/15 Days Discharge Instructions: Apply Kerlix and Coban  compression as directed. Com pression Wrap: Coban Self-Adherent Wrap 4x5 (in/yd) (Generic) 1 x Per Week/15 Days Discharge Instructions: Apply over Kerlix as directed. WOUND #35: - Foot Wound Laterality: Dorsal, Left, Lateral Cleanser: Wound Cleanser (Generic) 1 x Per Week/15 Days Discharge Instructions: Cleanse the wound with wound cleanser prior to applying a clean dressing using gauze sponges, not tissue or cotton balls. Cleanser: Soap and Water 1 x Per Week/15 Days Discharge Instructions: May shower and wash wound with dial antibacterial soap and water prior to dressing change. Peri-Wound Care: Sween Lotion (Moisturizing lotion) 1 x Per Week/15 Days Discharge Instructions: Apply moisturizing lotion as directed Prim Dressing: KerraCel Ag Gelling Fiber Dressing, 2x2 in (silver alginate) (Generic) 1 x Per Week/15 Days ary Discharge Instructions: Apply silver alginate to wound bed as instructed Secondary Dressing: Woven Gauze Sponge, Non-Sterile 4x4 in (Generic) 1 x Per Week/15 Days Discharge Instructions: Apply over primary dressing as directed. Secondary Dressing: ABD Pad, 5x9 1 x Per Week/15 Days Discharge Instructions: Apply over primary dressing as directed. Com pression Wrap: Kerlix Roll 4.5x3.1 (in/yd) (Generic) 1 x Per Week/15 Days Discharge Instructions: Apply Kerlix and Coban compression as directed. Com pression Wrap: Coban Self-Adherent Wrap 4x5 (in/yd) (Generic) 1 x Per Week/15 Days Discharge Instructions: Apply over Kerlix as directed. 1. I am going to await the MRI of the left foot. Previous x-rays were negative 2. If he does not have osteomyelitis I think he is going to need more aggressive compression. Chronic venous hypertension is not always accompanied by a major edema 3. I do not think he requires additional antibiotic Electronic Signature(s) Signed: 07/05/2020 5:48:16 PM By: Linton Ham MD Entered By: Linton Ham on 07/05/2020  12:42:00 -------------------------------------------------------------------------------- SuperBill Details Patient Name: Date of Service: [name] [name] F. 07/05/2020 Medical Record Number: [id number] Patient Account Number: [id number] Date of Birth/Sex: Treating RN: 04/11/1946 (75 y.o. 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 Com pression Wrap: Coban Self-Adherent Wrap 4x5 (in/yd) (Generic) 1 x Per Week/15 Days Discharge Instructions: Apply over Kerlix as directed. WOUND #34: - Foot Wound Laterality: Dorsal, Right, Medial Cleanser: Wound Cleanser (Generic) 1 x Per Week/15 Days Discharge Instructions: Cleanse the wound with wound cleanser prior to applying a clean dressing using gauze sponges, not tissue or cotton balls. Cleanser: Soap and Water 1 x Per Week/15 Days Discharge Instructions: May shower and wash wound with dial antibacterial soap and water prior to dressing change. Peri-Wound Care: Sween Lotion (Moisturizing lotion) 1 x Per Week/15 Days Discharge Instructions: Apply moisturizing lotion as directed Prim Dressing: KerraCel Ag Gelling Fiber Dressing, 4x5 in (silver alginate) (Generic) 1 x Per Week/15 Days ary Discharge Instructions: Apply silver alginate to wound bed as instructed Secondary Dressing: Woven Gauze Sponge, Non-Sterile 4x4 in (Generic) 1 x Per Week/15 Days Discharge Instructions: Apply over primary dressing as directed. Secondary Dressing: ABD Pad, 5x9 1 x Per Week/15 Days Discharge Instructions: Apply over primary dressing as directed. Com pression Wrap: Kerlix Roll 4.5x3.1 (in/yd) (Generic) 1 x Per Week/15 Days Discharge Instructions: Apply Kerlix and Coban  compression as directed. Com pression Wrap: Coban Self-Adherent Wrap 4x5 (in/yd) (Generic) 1 x Per Week/15 Days Discharge Instructions: Apply over Kerlix as directed. WOUND #35: - Foot Wound Laterality: Dorsal, Left, Lateral Cleanser: Wound Cleanser (Generic) 1 x Per Week/15 Days Discharge Instructions: Cleanse the wound with wound cleanser prior to applying a clean dressing using gauze sponges, not tissue or cotton balls. Cleanser: Soap and Water 1 x Per Week/15 Days Discharge Instructions: May shower and wash wound with dial antibacterial soap and water prior to dressing change. Peri-Wound Care: Sween Lotion (Moisturizing lotion) 1 x Per Week/15 Days Discharge Instructions: Apply moisturizing lotion as directed Prim Dressing: KerraCel Ag Gelling Fiber Dressing, 2x2 in (silver alginate) (Generic) 1 x Per Week/15 Days ary Discharge Instructions: Apply silver alginate to wound bed as instructed Secondary Dressing: Woven Gauze Sponge, Non-Sterile 4x4 in (Generic) 1 x Per Week/15 Days Discharge Instructions: Apply over primary dressing as directed. Secondary Dressing: ABD Pad, 5x9 1 x Per Week/15 Days Discharge Instructions: Apply over primary dressing as directed. Com pression Wrap: Kerlix Roll 4.5x3.1 (in/yd) (Generic) 1 x Per Week/15 Days Discharge Instructions: Apply Kerlix and Coban compression as directed. Com pression Wrap: Coban Self-Adherent Wrap 4x5 (in/yd) (Generic) 1 x Per Week/15 Days Discharge Instructions: Apply over Kerlix as directed. 1. I am going to await the MRI of the left foot. Previous x-rays were negative 2. If he does not have osteomyelitis I think he is going to need more aggressive compression. Chronic venous hypertension is not always accompanied by a major edema 3. I do not think he requires additional antibiotic Electronic Signature(s) Signed: 07/05/2020 5:48:16 PM By: Linton Ham MD Entered By: Linton Ham on 07/05/2020  12:42:00 -------------------------------------------------------------------------------- SuperBill Details Patient Name: Date of Service: [name] [name] F. 07/05/2020 Medical Record Number: [id number] Patient Account Number: [id number] Date of Birth/Sex: Treating RN: 04/11/1946 (75 y.o. 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 Com pression Wrap: Coban Self-Adherent Wrap 4x5 (in/yd) (Generic) 1 x Per Week/15 Days Discharge Instructions: Apply over Kerlix as directed. WOUND #34: - Foot Wound Laterality: Dorsal, Right, Medial Cleanser: Wound Cleanser (Generic) 1 x Per Week/15 Days Discharge Instructions: Cleanse the wound with wound cleanser prior to applying a clean dressing using gauze sponges, not tissue or cotton balls. Cleanser: Soap and Water 1 x Per Week/15 Days Discharge Instructions: May shower and wash wound with dial antibacterial soap and water prior to dressing change. Peri-Wound Care: Sween Lotion (Moisturizing lotion) 1 x Per Week/15 Days Discharge Instructions: Apply moisturizing lotion as directed Prim Dressing: KerraCel Ag Gelling Fiber Dressing, 4x5 in (silver alginate) (Generic) 1 x Per Week/15 Days ary Discharge Instructions: Apply silver alginate to wound bed as instructed Secondary Dressing: Woven Gauze Sponge, Non-Sterile 4x4 in (Generic) 1 x Per Week/15 Days Discharge Instructions: Apply over primary dressing as directed. Secondary Dressing: ABD Pad, 5x9 1 x Per Week/15 Days Discharge Instructions: Apply over primary dressing as directed. Com pression Wrap: Kerlix Roll 4.5x3.1 (in/yd) (Generic) 1 x Per Week/15 Days Discharge Instructions: Apply Kerlix and Coban  compression as directed. Com pression Wrap: Coban Self-Adherent Wrap 4x5 (in/yd) (Generic) 1 x Per Week/15 Days Discharge Instructions: Apply over Kerlix as directed. WOUND #35: - Foot Wound Laterality: Dorsal, Left, Lateral Cleanser: Wound Cleanser (Generic) 1 x Per Week/15 Days Discharge Instructions: Cleanse the wound with wound cleanser prior to applying a clean dressing using gauze sponges, not tissue or cotton balls. Cleanser: Soap and Water 1 x Per Week/15 Days Discharge Instructions: May shower and wash wound with dial antibacterial soap and water prior to dressing change. Peri-Wound Care: Sween Lotion (Moisturizing lotion) 1 x Per Week/15 Days Discharge Instructions: Apply moisturizing lotion as directed Prim Dressing: KerraCel Ag Gelling Fiber Dressing, 2x2 in (silver alginate) (Generic) 1 x Per Week/15 Days ary Discharge Instructions: Apply silver alginate to wound bed as instructed Secondary Dressing: Woven Gauze Sponge, Non-Sterile 4x4 in (Generic) 1 x Per Week/15 Days Discharge Instructions: Apply over primary dressing as directed. Secondary Dressing: ABD Pad, 5x9 1 x Per Week/15 Days Discharge Instructions: Apply over primary dressing as directed. Com pression Wrap: Kerlix Roll 4.5x3.1 (in/yd) (Generic) 1 x Per Week/15 Days Discharge Instructions: Apply Kerlix and Coban compression as directed. Com pression Wrap: Coban Self-Adherent Wrap 4x5 (in/yd) (Generic) 1 x Per Week/15 Days Discharge Instructions: Apply over Kerlix as directed. 1. I am going to await the MRI of the left foot. Previous x-rays were negative 2. If he does not have osteomyelitis I think he is going to need more aggressive compression. Chronic venous hypertension is not always accompanied by a major edema 3. I do not think he requires additional antibiotic Electronic Signature(s) Signed: 07/05/2020 5:48:16 PM By: Linton Ham MD Entered By: Linton Ham on 07/05/2020  12:42:00 -------------------------------------------------------------------------------- SuperBill Details Patient Name: Date of Service: [name] [name] F. 07/05/2020 Medical Record Number: [id number] Patient Account Number: [id number] Date of Birth/Sex: Treating RN: 04/11/1946 (75 y.o. 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 07/05/2020 10:00 A M Medical Record Number: [id number] Patient Account Number: [id number] Date of Birth/Sex: Treating RN: 1946-04-03 (75 y.o. [name], [name] Primary Care Provider: [name], [name] Other Clinician: Referring Provider: Treating Provider/Extender: Maebelle Sulton [name], [name] Weeks in Treatment: 220 Debridement Performed for Assessment: Wound #14 Left,Lateral Lower Leg Performed By: Physician [name], MD Debridement Type: Debridement Severity of Tissue Pre Debridement: Fat layer exposed Level of Consciousness (Pre-procedure): Awake and Alert Pre-procedure Verification/Time Out Yes - 11:42 Taken: Start Time: 11:42 Pain Control: Lidocaine T Area Debrided (L x W): otal 10 (cm) x 1.7 (cm) = 17 (cm) Tissue and other material debrided: Viable, Non-Viable, Eschar, Slough, Subcutaneous, Skin: Dermis , Skin: Epidermis, Slough Level: Skin/Subcutaneous Tissue Debridement Description: Excisional Instrument: Curette Bleeding: Minimum Hemostasis Achieved: Pressure End Time: 11:42 Procedural Pain: 0 Post Procedural Pain: 0 Response to Treatment: Procedure was tolerated well Level of Consciousness (Post- Awake and Alert procedure): Post Debridement Measurements of Total Wound Length: (cm) 10 Width: (cm) 1.7 Depth: (cm) 0.3 Volume: (cm) 4.006 Character of Wound/Ulcer Post Debridement: Improved Severity of Tissue Post Debridement: Fat layer exposed Post Procedure Diagnosis Same as Pre-procedure Electronic Signature(s) Signed: 07/05/2020 5:48:16 PM By: Linton Ham MD Signed: 07/06/2020 5:17:38 PM By: [name] Entered By: Linton Ham on 07/05/2020 12:42:31 -------------------------------------------------------------------------------- HPI Details Patient Name: Date of Service: [name] [name] F. 07/05/2020  10:00 A M Medical Record Number: [id number] Patient Account Number: [id number] Date of Birth/Sex: Treating RN: 09-10-1945 (75 y.o. [name] Primary Care Provider: [name], [name] Other Clinician: Referring Provider: Treating Provider/Extender: Garcia Dalzell [name], [name] Weeks in Treatment: 220 History of Present Illness Location: On the left and right lateral forefoot which has been there for about 6 months Quality: Patient reports No Pain. Severity: Patient states wound(s) are getting worse. Duration: Patient has had the wound for > 6 months prior to seeking treatment at the wound center Context: The wound would happen gradually Modifying Factors: Patient wound(s)/ulcer(s) are worsening due to :continual drainage from the wound ssociated Signs and Symptoms: Patient reports having increase discharge. A HPI Description: This patient returns after being seen here till the end of August and he was lost to follow-up. he has been quite debilitated laying in bed most of the time and his condition has deteriorated significantly. He has multiple ulcerations on the heel lateral forefoot and some of his toes. ======== Old notes: 75 year old male known to our practice when he was seen here in February and March and was lost to follow-up when he was admitted to hospital with various medical problems including coronary artery disease and a stroke. Now returns with the problem on the left forefoot where he has an ulceration and this has been there for about 6 months. 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 He has had persistent hyperglycemia given recently. Patient has a past medical history of diabetes mellitus, hypertension, combined systolic and diastolic heart failure, peripheral neuropathy, gout, cardiomyopathy with ejection fraction of  2x2 in (silver alginate) (Generic) 1 x Per Week/15 Days ary Discharge Instructions: Apply silver alginate to wound bed as instructed Secondary Dressing: Woven Gauze Sponge, Non-Sterile 4x4 in (Generic) 1 x Per Week/15 Days Discharge Instructions: Apply over primary dressing as directed. Secondary Dressing: ABD Pad, 5x9 1 x Per Week/15 Days Discharge Instructions: Apply over primary dressing as directed. Compression Wrap: Kerlix Roll 4.5x3.1 (in/yd) (Generic) 1 x Per Week/15 Days Discharge Instructions: Apply Kerlix and Coban compression as directed. Compression Wrap: Coban Self-Adherent Wrap 4x5 (in/yd) (Generic) 1 x Per Week/15 Days Discharge Instructions: Apply over Kerlix as directed. Wound #34 - Foot Wound Laterality: Dorsal, Right, Medial Cleanser: Wound Cleanser (Generic) 1 x Per Week/15 Days Discharge Instructions: Cleanse the wound with wound cleanser prior to applying a clean dressing using gauze sponges, not tissue or cotton balls. Cleanser: Soap and Water 1 x Per Week/15 Days Discharge Instructions: May shower and wash wound with dial antibacterial soap and water prior to dressing change. Peri-Wound Care: Sween Lotion (Moisturizing lotion) 1 x Per Week/15 Days Discharge Instructions: Apply moisturizing lotion as directed Prim Dressing: KerraCel Ag Gelling Fiber Dressing, 4x5 in (silver alginate) (Generic) 1 x Per Week/15 Days ary Discharge Instructions: Apply silver alginate to wound bed as instructed Secondary Dressing: Woven Gauze Sponge, Non-Sterile 4x4 in  (Generic) 1 x Per Week/15 Days Discharge Instructions: Apply over primary dressing as directed. Secondary Dressing: ABD Pad, 5x9 1 x Per Week/15 Days Discharge Instructions: Apply over primary dressing as directed. Compression Wrap: Kerlix Roll 4.5x3.1 (in/yd) (Generic) 1 x Per Week/15 Days Discharge Instructions: Apply Kerlix and Coban compression as directed. Compression Wrap: Coban Self-Adherent Wrap 4x5 (in/yd) (Generic) 1 x Per Week/15 Days Discharge Instructions: Apply over Kerlix as directed. Wound #35 - Foot Wound Laterality: Dorsal, Left, Lateral Cleanser: Wound Cleanser (Generic) 1 x Per Week/15 Days Discharge Instructions: Cleanse the wound with wound cleanser prior to applying a clean dressing using gauze sponges, not tissue or cotton balls. Cleanser: Soap and Water 1 x Per Week/15 Days Discharge Instructions: May shower and wash wound with dial antibacterial soap and water prior to dressing change. Peri-Wound Care: Sween Lotion (Moisturizing lotion) 1 x Per Week/15 Days Discharge Instructions: Apply moisturizing lotion as directed Prim Dressing: KerraCel Ag Gelling Fiber Dressing, 2x2 in (silver alginate) (Generic) 1 x Per Week/15 Days ary Discharge Instructions: Apply silver alginate to wound bed as instructed Secondary Dressing: Woven Gauze Sponge, Non-Sterile 4x4 in (Generic) 1 x Per Week/15 Days Discharge Instructions: Apply over primary dressing as directed. Secondary Dressing: ABD Pad, 5x9 1 x Per Week/15 Days Discharge Instructions: Apply over primary dressing as directed. Compression Wrap: Kerlix Roll 4.5x3.1 (in/yd) (Generic) 1 x Per Week/15 Days Discharge Instructions: Apply Kerlix and Coban compression as directed. Compression Wrap: Coban Self-Adherent Wrap 4x5 (in/yd) (Generic) 1 x Per Week/15 Days Discharge Instructions: Apply over Kerlix as directed. Electronic Signature(s) Signed: 07/05/2020 5:48:16 PM By: Linton Ham MD Signed: 07/06/2020 5:17:38 PM By:  [name] Entered By: [name] on 07/05/2020 11:49:59 -------------------------------------------------------------------------------- Problem List Details Patient Name: Date of Service: [name] [name] F. 07/05/2020 10:00 A M Medical Record Number: [id number] Patient Account Number: [id number] Date of Birth/Sex: Treating RN: October 22, 1945 (75 y.o. [name] Primary Care Provider: [name], [name] Other Clinician: Referring Provider: Treating Provider/Extender: Umar Patmon [name], [name] Weeks in Treatment: 220 Active Problems ICD-10 Encounter Code Description Active Date MDM Diagnosis E11.621 Type 2 diabetes mellitus with foot ulcer 04/17/2016 No Yes L89.613 Pressure ulcer of right heel, stage 3  Days Discharge Instructions: Apply Kerlix and Coban compression as directed. Com pression Wrap: Coban Self-Adherent Wrap 4x5 (in/yd) (Generic) 1 x Per Week/15 Days Discharge Instructions: Apply over Kerlix as directed. WOUND #34: - Foot Wound Laterality: Dorsal, Right, Medial Cleanser: Wound Cleanser (Generic) 1 x Per Week/15 Days Discharge Instructions: Cleanse the wound with wound cleanser prior to applying a clean dressing using gauze sponges, not tissue or cotton balls. Cleanser: Soap and Water 1 x Per Week/15 Days Discharge Instructions: May shower and wash wound with dial antibacterial soap and water prior to dressing change. Peri-Wound Care: Sween Lotion (Moisturizing lotion) 1 x Per Week/15 Days Discharge Instructions: Apply moisturizing lotion as directed Prim Dressing: KerraCel Ag Gelling Fiber Dressing, 4x5 in (silver alginate) (Generic) 1 x Per Week/15 Days ary Discharge Instructions: Apply silver alginate to wound bed as instructed Secondary Dressing: Woven Gauze Sponge, Non-Sterile 4x4 in (Generic) 1 x Per Week/15 Days Discharge Instructions: Apply over primary dressing as directed. Secondary Dressing: ABD Pad, 5x9 1 x Per Week/15 Days Discharge Instructions: Apply over primary dressing as directed. Com pression Wrap: Kerlix Roll 4.5x3.1 (in/yd) (Generic) 1 x Per Week/15 Days Discharge Instructions: Apply Kerlix and Coban  compression as directed. Com pression Wrap: Coban Self-Adherent Wrap 4x5 (in/yd) (Generic) 1 x Per Week/15 Days Discharge Instructions: Apply over Kerlix as directed. WOUND #35: - Foot Wound Laterality: Dorsal, Left, Lateral Cleanser: Wound Cleanser (Generic) 1 x Per Week/15 Days Discharge Instructions: Cleanse the wound with wound cleanser prior to applying a clean dressing using gauze sponges, not tissue or cotton balls. Cleanser: Soap and Water 1 x Per Week/15 Days Discharge Instructions: May shower and wash wound with dial antibacterial soap and water prior to dressing change. Peri-Wound Care: Sween Lotion (Moisturizing lotion) 1 x Per Week/15 Days Discharge Instructions: Apply moisturizing lotion as directed Prim Dressing: KerraCel Ag Gelling Fiber Dressing, 2x2 in (silver alginate) (Generic) 1 x Per Week/15 Days ary Discharge Instructions: Apply silver alginate to wound bed as instructed Secondary Dressing: Woven Gauze Sponge, Non-Sterile 4x4 in (Generic) 1 x Per Week/15 Days Discharge Instructions: Apply over primary dressing as directed. Secondary Dressing: ABD Pad, 5x9 1 x Per Week/15 Days Discharge Instructions: Apply over primary dressing as directed. Com pression Wrap: Kerlix Roll 4.5x3.1 (in/yd) (Generic) 1 x Per Week/15 Days Discharge Instructions: Apply Kerlix and Coban compression as directed. Com pression Wrap: Coban Self-Adherent Wrap 4x5 (in/yd) (Generic) 1 x Per Week/15 Days Discharge Instructions: Apply over Kerlix as directed. 1. I am going to await the MRI of the left foot. Previous x-rays were negative 2. If he does not have osteomyelitis I think he is going to need more aggressive compression. Chronic venous hypertension is not always accompanied by a major edema 3. I do not think he requires additional antibiotic Electronic Signature(s) Signed: 07/05/2020 5:48:16 PM By: Linton Ham MD Entered By: Linton Ham on 07/05/2020  12:42:00 -------------------------------------------------------------------------------- SuperBill Details Patient Name: Date of Service: [name] [name] F. 07/05/2020 Medical Record Number: [id number] Patient Account Number: [id number] Date of Birth/Sex: Treating RN: 04/11/1946 (75 y.o. 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 Secondary Dressing: ABD Pad, 5x9 1 x Per Week/15 Days Discharge Instructions: Apply over primary dressing as directed. Compression Wrap: Kerlix Roll 4.5x3.1 (in/yd) (Generic) 1 x Per Week/15 Days Discharge Instructions: Apply Kerlix and Coban compression as directed. Compression Wrap: Coban Self-Adherent Wrap 4x5 (in/yd) (Generic) 1 x Per Week/15 Days Discharge Instructions: Apply over Kerlix as directed. Wound #34 - Foot Wound Laterality: Dorsal, Right, Medial Cleanser: Wound Cleanser (Generic) 1 x Per Week/15 Days Discharge Instructions: Cleanse the wound with wound cleanser prior to applying a clean dressing using gauze sponges, not tissue or cotton balls. Cleanser: Soap and Water 1 x Per Week/15 Days Discharge Instructions: May shower and wash wound with dial antibacterial soap and water prior to dressing change. Peri-Wound Care: Sween Lotion (Moisturizing lotion) 1 x Per Week/15 Days Discharge Instructions: Apply moisturizing lotion as directed Prim Dressing: KerraCel Ag Gelling Fiber Dressing, 4x5 in (silver alginate) (Generic) 1 x Per Week/15 Days ary Discharge Instructions: Apply silver alginate to wound bed as instructed Secondary Dressing: Woven Gauze Sponge, Non-Sterile 4x4 in  (Generic) 1 x Per Week/15 Days Discharge Instructions: Apply over primary dressing as directed. Secondary Dressing: ABD Pad, 5x9 1 x Per Week/15 Days Discharge Instructions: Apply over primary dressing as directed. Compression Wrap: Kerlix Roll 4.5x3.1 (in/yd) (Generic) 1 x Per Week/15 Days Discharge Instructions: Apply Kerlix and Coban compression as directed. Compression Wrap: Coban Self-Adherent Wrap 4x5 (in/yd) (Generic) 1 x Per Week/15 Days Discharge Instructions: Apply over Kerlix as directed. Wound #35 - Foot Wound Laterality: Dorsal, Left, Lateral Cleanser: Wound Cleanser (Generic) 1 x Per Week/15 Days Discharge Instructions: Cleanse the wound with wound cleanser prior to applying a clean dressing using gauze sponges, not tissue or cotton balls. Cleanser: Soap and Water 1 x Per Week/15 Days Discharge Instructions: May shower and wash wound with dial antibacterial soap and water prior to dressing change. Peri-Wound Care: Sween Lotion (Moisturizing lotion) 1 x Per Week/15 Days Discharge Instructions: Apply moisturizing lotion as directed Prim Dressing: KerraCel Ag Gelling Fiber Dressing, 2x2 in (silver alginate) (Generic) 1 x Per Week/15 Days ary Discharge Instructions: Apply silver alginate to wound bed as instructed Secondary Dressing: Woven Gauze Sponge, Non-Sterile 4x4 in (Generic) 1 x Per Week/15 Days Discharge Instructions: Apply over primary dressing as directed. Secondary Dressing: ABD Pad, 5x9 1 x Per Week/15 Days Discharge Instructions: Apply over primary dressing as directed. Compression Wrap: Kerlix Roll 4.5x3.1 (in/yd) (Generic) 1 x Per Week/15 Days Discharge Instructions: Apply Kerlix and Coban compression as directed. Compression Wrap: Coban Self-Adherent Wrap 4x5 (in/yd) (Generic) 1 x Per Week/15 Days Discharge Instructions: Apply over Kerlix as directed. Electronic Signature(s) Signed: 07/05/2020 5:48:16 PM By: Linton Ham MD Signed: 07/06/2020 5:17:38 PM By:  [name] Entered By: [name] on 07/05/2020 11:49:59 -------------------------------------------------------------------------------- Problem List Details Patient Name: Date of Service: [name] [name] F. 07/05/2020 10:00 A M Medical Record Number: [id number] Patient Account Number: [id number] Date of Birth/Sex: Treating RN: October 22, 1945 (75 y.o. [name] Primary Care Provider: [name], [name] Other Clinician: Referring Provider: Treating Provider/Extender: Umar Patmon [name], [name] Weeks in Treatment: 220 Active Problems ICD-10 Encounter Code Description Active Date MDM Diagnosis E11.621 Type 2 diabetes mellitus with foot ulcer 04/17/2016 No Yes L89.613 Pressure ulcer of right heel, stage 3  2x2 in (silver alginate) (Generic) 1 x Per Week/15 Days ary Discharge Instructions: Apply silver alginate to wound bed as instructed Secondary Dressing: Woven Gauze Sponge, Non-Sterile 4x4 in (Generic) 1 x Per Week/15 Days Discharge Instructions: Apply over primary dressing as directed. Secondary Dressing: ABD Pad, 5x9 1 x Per Week/15 Days Discharge Instructions: Apply over primary dressing as directed. Compression Wrap: Kerlix Roll 4.5x3.1 (in/yd) (Generic) 1 x Per Week/15 Days Discharge Instructions: Apply Kerlix and Coban compression as directed. Compression Wrap: Coban Self-Adherent Wrap 4x5 (in/yd) (Generic) 1 x Per Week/15 Days Discharge Instructions: Apply over Kerlix as directed. Wound #34 - Foot Wound Laterality: Dorsal, Right, Medial Cleanser: Wound Cleanser (Generic) 1 x Per Week/15 Days Discharge Instructions: Cleanse the wound with wound cleanser prior to applying a clean dressing using gauze sponges, not tissue or cotton balls. Cleanser: Soap and Water 1 x Per Week/15 Days Discharge Instructions: May shower and wash wound with dial antibacterial soap and water prior to dressing change. Peri-Wound Care: Sween Lotion (Moisturizing lotion) 1 x Per Week/15 Days Discharge Instructions: Apply moisturizing lotion as directed Prim Dressing: KerraCel Ag Gelling Fiber Dressing, 4x5 in (silver alginate) (Generic) 1 x Per Week/15 Days ary Discharge Instructions: Apply silver alginate to wound bed as instructed Secondary Dressing: Woven Gauze Sponge, Non-Sterile 4x4 in  (Generic) 1 x Per Week/15 Days Discharge Instructions: Apply over primary dressing as directed. Secondary Dressing: ABD Pad, 5x9 1 x Per Week/15 Days Discharge Instructions: Apply over primary dressing as directed. Compression Wrap: Kerlix Roll 4.5x3.1 (in/yd) (Generic) 1 x Per Week/15 Days Discharge Instructions: Apply Kerlix and Coban compression as directed. Compression Wrap: Coban Self-Adherent Wrap 4x5 (in/yd) (Generic) 1 x Per Week/15 Days Discharge Instructions: Apply over Kerlix as directed. Wound #35 - Foot Wound Laterality: Dorsal, Left, Lateral Cleanser: Wound Cleanser (Generic) 1 x Per Week/15 Days Discharge Instructions: Cleanse the wound with wound cleanser prior to applying a clean dressing using gauze sponges, not tissue or cotton balls. Cleanser: Soap and Water 1 x Per Week/15 Days Discharge Instructions: May shower and wash wound with dial antibacterial soap and water prior to dressing change. Peri-Wound Care: Sween Lotion (Moisturizing lotion) 1 x Per Week/15 Days Discharge Instructions: Apply moisturizing lotion as directed Prim Dressing: KerraCel Ag Gelling Fiber Dressing, 2x2 in (silver alginate) (Generic) 1 x Per Week/15 Days ary Discharge Instructions: Apply silver alginate to wound bed as instructed Secondary Dressing: Woven Gauze Sponge, Non-Sterile 4x4 in (Generic) 1 x Per Week/15 Days Discharge Instructions: Apply over primary dressing as directed. Secondary Dressing: ABD Pad, 5x9 1 x Per Week/15 Days Discharge Instructions: Apply over primary dressing as directed. Compression Wrap: Kerlix Roll 4.5x3.1 (in/yd) (Generic) 1 x Per Week/15 Days Discharge Instructions: Apply Kerlix and Coban compression as directed. Compression Wrap: Coban Self-Adherent Wrap 4x5 (in/yd) (Generic) 1 x Per Week/15 Days Discharge Instructions: Apply over Kerlix as directed. Electronic Signature(s) Signed: 07/05/2020 5:48:16 PM By: Linton Ham MD Signed: 07/06/2020 5:17:38 PM By:  [name] Entered By: [name] on 07/05/2020 11:49:59 -------------------------------------------------------------------------------- Problem List Details Patient Name: Date of Service: [name] [name] F. 07/05/2020 10:00 A M Medical Record Number: [id number] Patient Account Number: [id number] Date of Birth/Sex: Treating RN: October 22, 1945 (75 y.o. [name] Primary Care Provider: [name], [name] Other Clinician: Referring Provider: Treating Provider/Extender: Umar Patmon [name], [name] Weeks in Treatment: 220 Active Problems ICD-10 Encounter Code Description Active Date MDM Diagnosis E11.621 Type 2 diabetes mellitus with foot ulcer 04/17/2016 No Yes L89.613 Pressure ulcer of right heel, stage 3  Days Discharge Instructions: Apply Kerlix and Coban compression as directed. Com pression Wrap: Coban Self-Adherent Wrap 4x5 (in/yd) (Generic) 1 x Per Week/15 Days Discharge Instructions: Apply over Kerlix as directed. WOUND #34: - Foot Wound Laterality: Dorsal, Right, Medial Cleanser: Wound Cleanser (Generic) 1 x Per Week/15 Days Discharge Instructions: Cleanse the wound with wound cleanser prior to applying a clean dressing using gauze sponges, not tissue or cotton balls. Cleanser: Soap and Water 1 x Per Week/15 Days Discharge Instructions: May shower and wash wound with dial antibacterial soap and water prior to dressing change. Peri-Wound Care: Sween Lotion (Moisturizing lotion) 1 x Per Week/15 Days Discharge Instructions: Apply moisturizing lotion as directed Prim Dressing: KerraCel Ag Gelling Fiber Dressing, 4x5 in (silver alginate) (Generic) 1 x Per Week/15 Days ary Discharge Instructions: Apply silver alginate to wound bed as instructed Secondary Dressing: Woven Gauze Sponge, Non-Sterile 4x4 in (Generic) 1 x Per Week/15 Days Discharge Instructions: Apply over primary dressing as directed. Secondary Dressing: ABD Pad, 5x9 1 x Per Week/15 Days Discharge Instructions: Apply over primary dressing as directed. Com pression Wrap: Kerlix Roll 4.5x3.1 (in/yd) (Generic) 1 x Per Week/15 Days Discharge Instructions: Apply Kerlix and Coban  compression as directed. Com pression Wrap: Coban Self-Adherent Wrap 4x5 (in/yd) (Generic) 1 x Per Week/15 Days Discharge Instructions: Apply over Kerlix as directed. WOUND #35: - Foot Wound Laterality: Dorsal, Left, Lateral Cleanser: Wound Cleanser (Generic) 1 x Per Week/15 Days Discharge Instructions: Cleanse the wound with wound cleanser prior to applying a clean dressing using gauze sponges, not tissue or cotton balls. Cleanser: Soap and Water 1 x Per Week/15 Days Discharge Instructions: May shower and wash wound with dial antibacterial soap and water prior to dressing change. Peri-Wound Care: Sween Lotion (Moisturizing lotion) 1 x Per Week/15 Days Discharge Instructions: Apply moisturizing lotion as directed Prim Dressing: KerraCel Ag Gelling Fiber Dressing, 2x2 in (silver alginate) (Generic) 1 x Per Week/15 Days ary Discharge Instructions: Apply silver alginate to wound bed as instructed Secondary Dressing: Woven Gauze Sponge, Non-Sterile 4x4 in (Generic) 1 x Per Week/15 Days Discharge Instructions: Apply over primary dressing as directed. Secondary Dressing: ABD Pad, 5x9 1 x Per Week/15 Days Discharge Instructions: Apply over primary dressing as directed. Com pression Wrap: Kerlix Roll 4.5x3.1 (in/yd) (Generic) 1 x Per Week/15 Days Discharge Instructions: Apply Kerlix and Coban compression as directed. Com pression Wrap: Coban Self-Adherent Wrap 4x5 (in/yd) (Generic) 1 x Per Week/15 Days Discharge Instructions: Apply over Kerlix as directed. 1. I am going to await the MRI of the left foot. Previous x-rays were negative 2. If he does not have osteomyelitis I think he is going to need more aggressive compression. Chronic venous hypertension is not always accompanied by a major edema 3. I do not think he requires additional antibiotic Electronic Signature(s) Signed: 07/05/2020 5:48:16 PM By: Linton Ham MD Entered By: Linton Ham on 07/05/2020  12:42:00 -------------------------------------------------------------------------------- SuperBill Details Patient Name: Date of Service: [name] [name] F. 07/05/2020 Medical Record Number: [id number] Patient Account Number: [id number] Date of Birth/Sex: Treating RN: 04/11/1946 (75 y.o. 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 Com pression Wrap: Coban Self-Adherent Wrap 4x5 (in/yd) (Generic) 1 x Per Week/15 Days Discharge Instructions: Apply over Kerlix as directed. WOUND #34: - Foot Wound Laterality: Dorsal, Right, Medial Cleanser: Wound Cleanser (Generic) 1 x Per Week/15 Days Discharge Instructions: Cleanse the wound with wound cleanser prior to applying a clean dressing using gauze sponges, not tissue or cotton balls. Cleanser: Soap and Water 1 x Per Week/15 Days Discharge Instructions: May shower and wash wound with dial antibacterial soap and water prior to dressing change. Peri-Wound Care: Sween Lotion (Moisturizing lotion) 1 x Per Week/15 Days Discharge Instructions: Apply moisturizing lotion as directed Prim Dressing: KerraCel Ag Gelling Fiber Dressing, 4x5 in (silver alginate) (Generic) 1 x Per Week/15 Days ary Discharge Instructions: Apply silver alginate to wound bed as instructed Secondary Dressing: Woven Gauze Sponge, Non-Sterile 4x4 in (Generic) 1 x Per Week/15 Days Discharge Instructions: Apply over primary dressing as directed. Secondary Dressing: ABD Pad, 5x9 1 x Per Week/15 Days Discharge Instructions: Apply over primary dressing as directed. Com pression Wrap: Kerlix Roll 4.5x3.1 (in/yd) (Generic) 1 x Per Week/15 Days Discharge Instructions: Apply Kerlix and Coban  compression as directed. Com pression Wrap: Coban Self-Adherent Wrap 4x5 (in/yd) (Generic) 1 x Per Week/15 Days Discharge Instructions: Apply over Kerlix as directed. WOUND #35: - Foot Wound Laterality: Dorsal, Left, Lateral Cleanser: Wound Cleanser (Generic) 1 x Per Week/15 Days Discharge Instructions: Cleanse the wound with wound cleanser prior to applying a clean dressing using gauze sponges, not tissue or cotton balls. Cleanser: Soap and Water 1 x Per Week/15 Days Discharge Instructions: May shower and wash wound with dial antibacterial soap and water prior to dressing change. Peri-Wound Care: Sween Lotion (Moisturizing lotion) 1 x Per Week/15 Days Discharge Instructions: Apply moisturizing lotion as directed Prim Dressing: KerraCel Ag Gelling Fiber Dressing, 2x2 in (silver alginate) (Generic) 1 x Per Week/15 Days ary Discharge Instructions: Apply silver alginate to wound bed as instructed Secondary Dressing: Woven Gauze Sponge, Non-Sterile 4x4 in (Generic) 1 x Per Week/15 Days Discharge Instructions: Apply over primary dressing as directed. Secondary Dressing: ABD Pad, 5x9 1 x Per Week/15 Days Discharge Instructions: Apply over primary dressing as directed. Com pression Wrap: Kerlix Roll 4.5x3.1 (in/yd) (Generic) 1 x Per Week/15 Days Discharge Instructions: Apply Kerlix and Coban compression as directed. Com pression Wrap: Coban Self-Adherent Wrap 4x5 (in/yd) (Generic) 1 x Per Week/15 Days Discharge Instructions: Apply over Kerlix as directed. 1. I am going to await the MRI of the left foot. Previous x-rays were negative 2. If he does not have osteomyelitis I think he is going to need more aggressive compression. Chronic venous hypertension is not always accompanied by a major edema 3. I do not think he requires additional antibiotic Electronic Signature(s) Signed: 07/05/2020 5:48:16 PM By: Linton Ham MD Entered By: Linton Ham on 07/05/2020  12:42:00 -------------------------------------------------------------------------------- SuperBill Details Patient Name: Date of Service: [name] [name] F. 07/05/2020 Medical Record Number: [id number] Patient Account Number: [id number] Date of Birth/Sex: Treating RN: 04/11/1946 (75 y.o. 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 Secondary Dressing: ABD Pad, 5x9 1 x Per Week/15 Days Discharge Instructions: Apply over primary dressing as directed. Compression Wrap: Kerlix Roll 4.5x3.1 (in/yd) (Generic) 1 x Per Week/15 Days Discharge Instructions: Apply Kerlix and Coban compression as directed. Compression Wrap: Coban Self-Adherent Wrap 4x5 (in/yd) (Generic) 1 x Per Week/15 Days Discharge Instructions: Apply over Kerlix as directed. Wound #34 - Foot Wound Laterality: Dorsal, Right, Medial Cleanser: Wound Cleanser (Generic) 1 x Per Week/15 Days Discharge Instructions: Cleanse the wound with wound cleanser prior to applying a clean dressing using gauze sponges, not tissue or cotton balls. Cleanser: Soap and Water 1 x Per Week/15 Days Discharge Instructions: May shower and wash wound with dial antibacterial soap and water prior to dressing change. Peri-Wound Care: Sween Lotion (Moisturizing lotion) 1 x Per Week/15 Days Discharge Instructions: Apply moisturizing lotion as directed Prim Dressing: KerraCel Ag Gelling Fiber Dressing, 4x5 in (silver alginate) (Generic) 1 x Per Week/15 Days ary Discharge Instructions: Apply silver alginate to wound bed as instructed Secondary Dressing: Woven Gauze Sponge, Non-Sterile 4x4 in  (Generic) 1 x Per Week/15 Days Discharge Instructions: Apply over primary dressing as directed. Secondary Dressing: ABD Pad, 5x9 1 x Per Week/15 Days Discharge Instructions: Apply over primary dressing as directed. Compression Wrap: Kerlix Roll 4.5x3.1 (in/yd) (Generic) 1 x Per Week/15 Days Discharge Instructions: Apply Kerlix and Coban compression as directed. Compression Wrap: Coban Self-Adherent Wrap 4x5 (in/yd) (Generic) 1 x Per Week/15 Days Discharge Instructions: Apply over Kerlix as directed. Wound #35 - Foot Wound Laterality: Dorsal, Left, Lateral Cleanser: Wound Cleanser (Generic) 1 x Per Week/15 Days Discharge Instructions: Cleanse the wound with wound cleanser prior to applying a clean dressing using gauze sponges, not tissue or cotton balls. Cleanser: Soap and Water 1 x Per Week/15 Days Discharge Instructions: May shower and wash wound with dial antibacterial soap and water prior to dressing change. Peri-Wound Care: Sween Lotion (Moisturizing lotion) 1 x Per Week/15 Days Discharge Instructions: Apply moisturizing lotion as directed Prim Dressing: KerraCel Ag Gelling Fiber Dressing, 2x2 in (silver alginate) (Generic) 1 x Per Week/15 Days ary Discharge Instructions: Apply silver alginate to wound bed as instructed Secondary Dressing: Woven Gauze Sponge, Non-Sterile 4x4 in (Generic) 1 x Per Week/15 Days Discharge Instructions: Apply over primary dressing as directed. Secondary Dressing: ABD Pad, 5x9 1 x Per Week/15 Days Discharge Instructions: Apply over primary dressing as directed. Compression Wrap: Kerlix Roll 4.5x3.1 (in/yd) (Generic) 1 x Per Week/15 Days Discharge Instructions: Apply Kerlix and Coban compression as directed. Compression Wrap: Coban Self-Adherent Wrap 4x5 (in/yd) (Generic) 1 x Per Week/15 Days Discharge Instructions: Apply over Kerlix as directed. Electronic Signature(s) Signed: 07/05/2020 5:48:16 PM By: Linton Ham MD Signed: 07/06/2020 5:17:38 PM By:  [name] Entered By: [name] on 07/05/2020 11:49:59 -------------------------------------------------------------------------------- Problem List Details Patient Name: Date of Service: [name] [name] F. 07/05/2020 10:00 A M Medical Record Number: [id number] Patient Account Number: [id number] Date of Birth/Sex: Treating RN: October 22, 1945 (75 y.o. [name] Primary Care Provider: [name], [name] Other Clinician: Referring Provider: Treating Provider/Extender: Umar Patmon [name], [name] Weeks in Treatment: 220 Active Problems ICD-10 Encounter Code Description Active Date MDM Diagnosis E11.621 Type 2 diabetes mellitus with foot ulcer 04/17/2016 No Yes L89.613 Pressure ulcer of right heel, stage 3  Days Discharge Instructions: Apply Kerlix and Coban compression as directed. Com pression Wrap: Coban Self-Adherent Wrap 4x5 (in/yd) (Generic) 1 x Per Week/15 Days Discharge Instructions: Apply over Kerlix as directed. WOUND #34: - Foot Wound Laterality: Dorsal, Right, Medial Cleanser: Wound Cleanser (Generic) 1 x Per Week/15 Days Discharge Instructions: Cleanse the wound with wound cleanser prior to applying a clean dressing using gauze sponges, not tissue or cotton balls. Cleanser: Soap and Water 1 x Per Week/15 Days Discharge Instructions: May shower and wash wound with dial antibacterial soap and water prior to dressing change. Peri-Wound Care: Sween Lotion (Moisturizing lotion) 1 x Per Week/15 Days Discharge Instructions: Apply moisturizing lotion as directed Prim Dressing: KerraCel Ag Gelling Fiber Dressing, 4x5 in (silver alginate) (Generic) 1 x Per Week/15 Days ary Discharge Instructions: Apply silver alginate to wound bed as instructed Secondary Dressing: Woven Gauze Sponge, Non-Sterile 4x4 in (Generic) 1 x Per Week/15 Days Discharge Instructions: Apply over primary dressing as directed. Secondary Dressing: ABD Pad, 5x9 1 x Per Week/15 Days Discharge Instructions: Apply over primary dressing as directed. Com pression Wrap: Kerlix Roll 4.5x3.1 (in/yd) (Generic) 1 x Per Week/15 Days Discharge Instructions: Apply Kerlix and Coban  compression as directed. Com pression Wrap: Coban Self-Adherent Wrap 4x5 (in/yd) (Generic) 1 x Per Week/15 Days Discharge Instructions: Apply over Kerlix as directed. WOUND #35: - Foot Wound Laterality: Dorsal, Left, Lateral Cleanser: Wound Cleanser (Generic) 1 x Per Week/15 Days Discharge Instructions: Cleanse the wound with wound cleanser prior to applying a clean dressing using gauze sponges, not tissue or cotton balls. Cleanser: Soap and Water 1 x Per Week/15 Days Discharge Instructions: May shower and wash wound with dial antibacterial soap and water prior to dressing change. Peri-Wound Care: Sween Lotion (Moisturizing lotion) 1 x Per Week/15 Days Discharge Instructions: Apply moisturizing lotion as directed Prim Dressing: KerraCel Ag Gelling Fiber Dressing, 2x2 in (silver alginate) (Generic) 1 x Per Week/15 Days ary Discharge Instructions: Apply silver alginate to wound bed as instructed Secondary Dressing: Woven Gauze Sponge, Non-Sterile 4x4 in (Generic) 1 x Per Week/15 Days Discharge Instructions: Apply over primary dressing as directed. Secondary Dressing: ABD Pad, 5x9 1 x Per Week/15 Days Discharge Instructions: Apply over primary dressing as directed. Com pression Wrap: Kerlix Roll 4.5x3.1 (in/yd) (Generic) 1 x Per Week/15 Days Discharge Instructions: Apply Kerlix and Coban compression as directed. Com pression Wrap: Coban Self-Adherent Wrap 4x5 (in/yd) (Generic) 1 x Per Week/15 Days Discharge Instructions: Apply over Kerlix as directed. 1. I am going to await the MRI of the left foot. Previous x-rays were negative 2. If he does not have osteomyelitis I think he is going to need more aggressive compression. Chronic venous hypertension is not always accompanied by a major edema 3. I do not think he requires additional antibiotic Electronic Signature(s) Signed: 07/05/2020 5:48:16 PM By: Linton Ham MD Entered By: Linton Ham on 07/05/2020  12:42:00 -------------------------------------------------------------------------------- SuperBill Details Patient Name: Date of Service: [name] [name] F. 07/05/2020 Medical Record Number: [id number] Patient Account Number: [id number] Date of Birth/Sex: Treating RN: 04/11/1946 (75 y.o. 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 Secondary Dressing: ABD Pad, 5x9 1 x Per Week/15 Days Discharge Instructions: Apply over primary dressing as directed. Compression Wrap: Kerlix Roll 4.5x3.1 (in/yd) (Generic) 1 x Per Week/15 Days Discharge Instructions: Apply Kerlix and Coban compression as directed. Compression Wrap: Coban Self-Adherent Wrap 4x5 (in/yd) (Generic) 1 x Per Week/15 Days Discharge Instructions: Apply over Kerlix as directed. Wound #34 - Foot Wound Laterality: Dorsal, Right, Medial Cleanser: Wound Cleanser (Generic) 1 x Per Week/15 Days Discharge Instructions: Cleanse the wound with wound cleanser prior to applying a clean dressing using gauze sponges, not tissue or cotton balls. Cleanser: Soap and Water 1 x Per Week/15 Days Discharge Instructions: May shower and wash wound with dial antibacterial soap and water prior to dressing change. Peri-Wound Care: Sween Lotion (Moisturizing lotion) 1 x Per Week/15 Days Discharge Instructions: Apply moisturizing lotion as directed Prim Dressing: KerraCel Ag Gelling Fiber Dressing, 4x5 in (silver alginate) (Generic) 1 x Per Week/15 Days ary Discharge Instructions: Apply silver alginate to wound bed as instructed Secondary Dressing: Woven Gauze Sponge, Non-Sterile 4x4 in  (Generic) 1 x Per Week/15 Days Discharge Instructions: Apply over primary dressing as directed. Secondary Dressing: ABD Pad, 5x9 1 x Per Week/15 Days Discharge Instructions: Apply over primary dressing as directed. Compression Wrap: Kerlix Roll 4.5x3.1 (in/yd) (Generic) 1 x Per Week/15 Days Discharge Instructions: Apply Kerlix and Coban compression as directed. Compression Wrap: Coban Self-Adherent Wrap 4x5 (in/yd) (Generic) 1 x Per Week/15 Days Discharge Instructions: Apply over Kerlix as directed. Wound #35 - Foot Wound Laterality: Dorsal, Left, Lateral Cleanser: Wound Cleanser (Generic) 1 x Per Week/15 Days Discharge Instructions: Cleanse the wound with wound cleanser prior to applying a clean dressing using gauze sponges, not tissue or cotton balls. Cleanser: Soap and Water 1 x Per Week/15 Days Discharge Instructions: May shower and wash wound with dial antibacterial soap and water prior to dressing change. Peri-Wound Care: Sween Lotion (Moisturizing lotion) 1 x Per Week/15 Days Discharge Instructions: Apply moisturizing lotion as directed Prim Dressing: KerraCel Ag Gelling Fiber Dressing, 2x2 in (silver alginate) (Generic) 1 x Per Week/15 Days ary Discharge Instructions: Apply silver alginate to wound bed as instructed Secondary Dressing: Woven Gauze Sponge, Non-Sterile 4x4 in (Generic) 1 x Per Week/15 Days Discharge Instructions: Apply over primary dressing as directed. Secondary Dressing: ABD Pad, 5x9 1 x Per Week/15 Days Discharge Instructions: Apply over primary dressing as directed. Compression Wrap: Kerlix Roll 4.5x3.1 (in/yd) (Generic) 1 x Per Week/15 Days Discharge Instructions: Apply Kerlix and Coban compression as directed. Compression Wrap: Coban Self-Adherent Wrap 4x5 (in/yd) (Generic) 1 x Per Week/15 Days Discharge Instructions: Apply over Kerlix as directed. Electronic Signature(s) Signed: 07/05/2020 5:48:16 PM By: Linton Ham MD Signed: 07/06/2020 5:17:38 PM By:  [name] Entered By: [name] on 07/05/2020 11:49:59 -------------------------------------------------------------------------------- Problem List Details Patient Name: Date of Service: [name] [name] F. 07/05/2020 10:00 A M Medical Record Number: [id number] Patient Account Number: [id number] Date of Birth/Sex: Treating RN: October 22, 1945 (75 y.o. [name] Primary Care Provider: [name], [name] Other Clinician: Referring Provider: Treating Provider/Extender: Umar Patmon [name], [name] Weeks in Treatment: 220 Active Problems ICD-10 Encounter Code Description Active Date MDM Diagnosis E11.621 Type 2 diabetes mellitus with foot ulcer 04/17/2016 No Yes L89.613 Pressure ulcer of right heel, stage 3  [name] [name], [name] [name] ([id number]) Visit Report for 07/05/2020 Debridement Details Patient Name: Date of Service: [name] [facility] F. 07/05/2020 10:00 A M Medical Record Number: [id number] Patient Account Number: [id number] Date of Birth/Sex: Treating RN: 1946-04-03 (75 y.o. [name], [name] Primary Care Provider: [name], [name] Other Clinician: Referring Provider: Treating Provider/Extender: Maebelle Sulton [name], [name] Weeks in Treatment: 220 Debridement Performed for Assessment: Wound #14 Left,Lateral Lower Leg Performed By: Physician [name], MD Debridement Type: Debridement Severity of Tissue Pre Debridement: Fat layer exposed Level of Consciousness (Pre-procedure): Awake and Alert Pre-procedure Verification/Time Out Yes - 11:42 Taken: Start Time: 11:42 Pain Control: Lidocaine T Area Debrided (L x W): otal 10 (cm) x 1.7 (cm) = 17 (cm) Tissue and other material debrided: Viable, Non-Viable, Eschar, Slough, Subcutaneous, Skin: Dermis , Skin: Epidermis, Slough Level: Skin/Subcutaneous Tissue Debridement Description: Excisional Instrument: Curette Bleeding: Minimum Hemostasis Achieved: Pressure End Time: 11:42 Procedural Pain: 0 Post Procedural Pain: 0 Response to Treatment: Procedure was tolerated well Level of Consciousness (Post- Awake and Alert procedure): Post Debridement Measurements of Total Wound Length: (cm) 10 Width: (cm) 1.7 Depth: (cm) 0.3 Volume: (cm) 4.006 Character of Wound/Ulcer Post Debridement: Improved Severity of Tissue Post Debridement: Fat layer exposed Post Procedure Diagnosis Same as Pre-procedure Electronic Signature(s) Signed: 07/05/2020 5:48:16 PM By: Linton Ham MD Signed: 07/06/2020 5:17:38 PM By: [name] Entered By: Linton Ham on 07/05/2020 12:42:31 -------------------------------------------------------------------------------- HPI Details Patient Name: Date of Service: [name] [name] F. 07/05/2020  10:00 A M Medical Record Number: [id number] Patient Account Number: [id number] Date of Birth/Sex: Treating RN: 09-10-1945 (75 y.o. [name] Primary Care Provider: [name], [name] Other Clinician: Referring Provider: Treating Provider/Extender: Garcia Dalzell [name], [name] Weeks in Treatment: 220 History of Present Illness Location: On the left and right lateral forefoot which has been there for about 6 months Quality: Patient reports No Pain. Severity: Patient states wound(s) are getting worse. Duration: Patient has had the wound for > 6 months prior to seeking treatment at the wound center Context: The wound would happen gradually Modifying Factors: Patient wound(s)/ulcer(s) are worsening due to :continual drainage from the wound ssociated Signs and Symptoms: Patient reports having increase discharge. A HPI Description: This patient returns after being seen here till the end of August and he was lost to follow-up. he has been quite debilitated laying in bed most of the time and his condition has deteriorated significantly. He has multiple ulcerations on the heel lateral forefoot and some of his toes. ======== Old notes: 75 year old male known to our practice when he was seen here in February and March and was lost to follow-up when he was admitted to hospital with various medical problems including coronary artery disease and a stroke. Now returns with the problem on the left forefoot where he has an ulceration and this has been there for about 6 months. 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 He has had persistent hyperglycemia given recently. Patient has a past medical history of diabetes mellitus, hypertension, combined systolic and diastolic heart failure, peripheral neuropathy, gout, cardiomyopathy with ejection fraction of

## 2020-07-18 ENCOUNTER — Other Ambulatory Visit: Payer: Self-pay | Admitting: Family Medicine

## 2020-07-19 ENCOUNTER — Other Ambulatory Visit: Payer: Self-pay

## 2020-07-19 ENCOUNTER — Other Ambulatory Visit (HOSPITAL_COMMUNITY)
Admission: RE | Admit: 2020-07-19 | Discharge: 2020-07-19 | Disposition: A | Discharge status: Home / Self Care | Payer: Medicare HMO | Source: Other Acute Inpatient Hospital | Attending: Internal Medicine | Admitting: Internal Medicine

## 2020-07-19 ENCOUNTER — Encounter (HOSPITAL_BASED_OUTPATIENT_CLINIC_OR_DEPARTMENT_OTHER): Payer: Medicare HMO | Admitting: Internal Medicine

## 2020-07-19 DIAGNOSIS — L97811 Non-pressure chronic ulcer of other part of right lower leg limited to breakdown of skin: Secondary | ICD-10-CM | POA: Diagnosis not present

## 2020-07-19 DIAGNOSIS — L97511 Non-pressure chronic ulcer of other part of right foot limited to breakdown of skin: Secondary | ICD-10-CM | POA: Diagnosis not present

## 2020-07-19 DIAGNOSIS — E11622 Type 2 diabetes mellitus with other skin ulcer: Secondary | ICD-10-CM | POA: Diagnosis not present

## 2020-07-19 DIAGNOSIS — E11621 Type 2 diabetes mellitus with foot ulcer: Secondary | ICD-10-CM | POA: Diagnosis not present

## 2020-07-19 DIAGNOSIS — L97812 Non-pressure chronic ulcer of other part of right lower leg with fat layer exposed: Secondary | ICD-10-CM | POA: Diagnosis not present

## 2020-07-19 DIAGNOSIS — L97522 Non-pressure chronic ulcer of other part of left foot with fat layer exposed: Secondary | ICD-10-CM | POA: Diagnosis not present

## 2020-07-19 DIAGNOSIS — E1122 Type 2 diabetes mellitus with diabetic chronic kidney disease: Secondary | ICD-10-CM | POA: Diagnosis not present

## 2020-07-19 DIAGNOSIS — L97321 Non-pressure chronic ulcer of left ankle limited to breakdown of skin: Secondary | ICD-10-CM | POA: Diagnosis not present

## 2020-07-19 DIAGNOSIS — L89613 Pressure ulcer of right heel, stage 3: Secondary | ICD-10-CM | POA: Diagnosis not present

## 2020-07-19 DIAGNOSIS — L97521 Non-pressure chronic ulcer of other part of left foot limited to breakdown of skin: Secondary | ICD-10-CM | POA: Insufficient documentation

## 2020-07-19 DIAGNOSIS — L97524 Non-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: Secondary | ICD-10-CM | POA: Diagnosis not present

## 2020-07-19 DIAGNOSIS — L97221 Non-pressure chronic ulcer of left calf limited to breakdown of skin: Secondary | ICD-10-CM | POA: Diagnosis not present

## 2020-07-21 NOTE — Progress Notes (Signed)
Date of Service: [name] 07/19/2020 9:30 A M Medical Record Number: [id number] Patient Account Number: [id number] Date of Birth/Sex: Treating RN: 06/05/1945 (75 y.o. [name], [name] Primary Care Provider: [name], [name] Other Clinician: Referring Provider: Treating Provider/Extender: Elonda Giuliano [name], [name] Weeks in Treatment: 222 Verbal / Phone Orders: No Diagnosis Coding ICD-10 Coding Code Description E11.621 Type 2 diabetes mellitus with foot ulcer L89.613 Pressure ulcer of right heel, stage 3 L97.518 Non-pressure chronic ulcer of other part of right foot with other specified severity L97.221 Non-pressure chronic ulcer of left calf limited to breakdown of skin L97.321 Non-pressure chronic ulcer of left ankle limited to breakdown of skin L97.521 Non-pressure chronic ulcer of other part of left foot limited to breakdown of skin L97.524 Non-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 Follow-up Appointments Return Appointment in 2 weeks. Bathing/ Shower/ Hygiene May shower with protection but do not get wound dressing(s) wet. Edema Control - Lymphedema / SCD / Other Elevate legs to the level of the heart or above for 30 minutes daily and/or when sitting, a frequency of: Avoid standing for long periods of time. Off-Loading Other: - multipodus boots to both feet Wound Treatment Wound #14 - Lower Leg Wound Laterality: Left, Lateral Cleanser: Wound Cleanser (Generic) 1 x Per Week/15 Days Discharge Instructions: Cleanse the wound with wound cleanser prior to  applying a clean dressing using gauze sponges, not tissue or cotton balls. Cleanser: Soap and Water 1 x Per Week/15 Days Discharge Instructions: May shower and wash wound with dial antibacterial soap and water prior to dressing change. Peri-Wound Care: Triamcinolone 15 (g) 1 x Per Week/15 Days Discharge Instructions: Use triamcinolone 15 (g) as directed Peri-Wound Care: Sween Lotion (Moisturizing lotion) 1 x Per Week/15 Days Discharge Instructions: Apply moisturizing lotion as directed Prim Dressing: KerraCel Ag Gelling Fiber Dressing, 4x5 in (silver alginate) (Generic) 1 x Per Week/15 Days ary Discharge Instructions: Apply silver alginate to wound bed as instructed Secondary Dressing: Woven Gauze Sponge, Non-Sterile 4x4 in (Generic) 1 x Per Week/15 Days Discharge Instructions: Apply over primary dressing as directed. Secondary Dressing: ABD Pad, 5x9 1 x Per Week/15 Days Discharge Instructions: Apply over primary dressing as directed. Compression Wrap: ThreePress (3 layer compression wrap) 1 x Per Week/15 Days Discharge Instructions: Apply three layer compression as directed. Wound #25 - Lower Leg Wound Laterality: Right, Lateral Cleanser: Wound Cleanser (Generic) 1 x Per Week/15 Days Discharge Instructions: Cleanse the wound with wound cleanser prior to applying a clean dressing using gauze sponges, not tissue or cotton balls. Cleanser: Soap and Water 1 x Per Week/15 Days Discharge Instructions: May shower and wash wound with dial antibacterial soap and water prior to dressing change. Peri-Wound Care: Triamcinolone 15 (g) 1 x Per Week/15 Days Discharge Instructions: Use triamcinolone 15 (g) as directed Peri-Wound Care: Sween Lotion (Moisturizing lotion) 1 x Per Week/15 Days Discharge Instructions: Apply moisturizing lotion as directed Prim Dressing: KerraCel Ag Gelling Fiber Dressing, 4x5 in (silver alginate) (Generic) 1 x Per Week/15 Days ary Discharge Instructions: Apply silver alginate  to wound bed as instructed Secondary Dressing: Woven Gauze Sponge, Non-Sterile 4x4 in (Generic) 1 x Per Week/15 Days Discharge Instructions: Apply over primary dressing as directed. Secondary Dressing: ABD Pad, 5x9 1 x Per Week/15 Days Discharge Instructions: Apply over primary dressing as directed. Compression Wrap: ThreePress (3 layer compression wrap) 1 x Per Week/15 Days Discharge Instructions: Apply three layer compression as directed. Wound #26 - Foot Wound Laterality: Dorsal, Left  Date of Service: [name] 07/19/2020 9:30 A M Medical Record Number: [id number] Patient Account Number: [id number] Date of Birth/Sex: Treating RN: 06/05/1945 (75 y.o. [name], [name] Primary Care Provider: [name], [name] Other Clinician: Referring Provider: Treating Provider/Extender: Elonda Giuliano [name], [name] Weeks in Treatment: 222 Verbal / Phone Orders: No Diagnosis Coding ICD-10 Coding Code Description E11.621 Type 2 diabetes mellitus with foot ulcer L89.613 Pressure ulcer of right heel, stage 3 L97.518 Non-pressure chronic ulcer of other part of right foot with other specified severity L97.221 Non-pressure chronic ulcer of left calf limited to breakdown of skin L97.321 Non-pressure chronic ulcer of left ankle limited to breakdown of skin L97.521 Non-pressure chronic ulcer of other part of left foot limited to breakdown of skin L97.524 Non-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 Follow-up Appointments Return Appointment in 2 weeks. Bathing/ Shower/ Hygiene May shower with protection but do not get wound dressing(s) wet. Edema Control - Lymphedema / SCD / Other Elevate legs to the level of the heart or above for 30 minutes daily and/or when sitting, a frequency of: Avoid standing for long periods of time. Off-Loading Other: - multipodus boots to both feet Wound Treatment Wound #14 - Lower Leg Wound Laterality: Left, Lateral Cleanser: Wound Cleanser (Generic) 1 x Per Week/15 Days Discharge Instructions: Cleanse the wound with wound cleanser prior to  applying a clean dressing using gauze sponges, not tissue or cotton balls. Cleanser: Soap and Water 1 x Per Week/15 Days Discharge Instructions: May shower and wash wound with dial antibacterial soap and water prior to dressing change. Peri-Wound Care: Triamcinolone 15 (g) 1 x Per Week/15 Days Discharge Instructions: Use triamcinolone 15 (g) as directed Peri-Wound Care: Sween Lotion (Moisturizing lotion) 1 x Per Week/15 Days Discharge Instructions: Apply moisturizing lotion as directed Prim Dressing: KerraCel Ag Gelling Fiber Dressing, 4x5 in (silver alginate) (Generic) 1 x Per Week/15 Days ary Discharge Instructions: Apply silver alginate to wound bed as instructed Secondary Dressing: Woven Gauze Sponge, Non-Sterile 4x4 in (Generic) 1 x Per Week/15 Days Discharge Instructions: Apply over primary dressing as directed. Secondary Dressing: ABD Pad, 5x9 1 x Per Week/15 Days Discharge Instructions: Apply over primary dressing as directed. Compression Wrap: ThreePress (3 layer compression wrap) 1 x Per Week/15 Days Discharge Instructions: Apply three layer compression as directed. Wound #25 - Lower Leg Wound Laterality: Right, Lateral Cleanser: Wound Cleanser (Generic) 1 x Per Week/15 Days Discharge Instructions: Cleanse the wound with wound cleanser prior to applying a clean dressing using gauze sponges, not tissue or cotton balls. Cleanser: Soap and Water 1 x Per Week/15 Days Discharge Instructions: May shower and wash wound with dial antibacterial soap and water prior to dressing change. Peri-Wound Care: Triamcinolone 15 (g) 1 x Per Week/15 Days Discharge Instructions: Use triamcinolone 15 (g) as directed Peri-Wound Care: Sween Lotion (Moisturizing lotion) 1 x Per Week/15 Days Discharge Instructions: Apply moisturizing lotion as directed Prim Dressing: KerraCel Ag Gelling Fiber Dressing, 4x5 in (silver alginate) (Generic) 1 x Per Week/15 Days ary Discharge Instructions: Apply silver alginate  to wound bed as instructed Secondary Dressing: Woven Gauze Sponge, Non-Sterile 4x4 in (Generic) 1 x Per Week/15 Days Discharge Instructions: Apply over primary dressing as directed. Secondary Dressing: ABD Pad, 5x9 1 x Per Week/15 Days Discharge Instructions: Apply over primary dressing as directed. Compression Wrap: ThreePress (3 layer compression wrap) 1 x Per Week/15 Days Discharge Instructions: Apply three layer compression as directed. Wound #26 - Foot Wound Laterality: Dorsal, Left  YENIEL, HARBOLD ([id number]) Visit Report for 07/19/2020 Debridement Details Patient Name: Date of Service: [name] [facility] F. 07/19/2020 9:30 A M Medical Record Number: [id number] Patient Account Number: [id number] Date of Birth/Sex: Treating RN: [birth year] (75 y.o. [name], [name] Primary Care Provider: [name], [name] Other Clinician: Referring Provider: Treating Provider/Extender: Anaisabel Pederson [name], [name] Weeks in Treatment: 222 Debridement Performed for Assessment: Wound #26 Left,Dorsal Foot Performed By: Physician [name], MD Debridement Type: Debridement Severity of Tissue Pre Debridement: Fat layer exposed Level of Consciousness (Pre-procedure): Awake and Alert Pre-procedure Verification/Time Out Yes - 10:55 Taken: Start Time: 10:55 Pain Control: Lidocaine T Area Debrided (L x W): otal 4 (cm) x 4 (cm) = 16 (cm) Tissue and other material debrided: Viable, Non-Viable, Slough, Subcutaneous, Tendon, Skin: Dermis , Skin: Epidermis, Slough Level: Skin/Subcutaneous Tissue/Muscle Debridement Description: Excisional Instrument: Blade, Curette, Forceps Specimen: Tissue Culture Number of Specimens T aken: 1 Bleeding: Minimum Hemostasis Achieved: Pressure End Time: 10:56 Procedural Pain: 0 Post Procedural Pain: 0 Response to Treatment: Procedure was tolerated well Level of Consciousness (Post- Awake and Alert procedure): Post Debridement Measurements of Total Wound Length: (cm) 4 Width: (cm) 4 Depth: (cm) 0.1 Volume: (cm) 1.257 Character of Wound/Ulcer Post Debridement: Improved Severity of Tissue Post Debridement: Fat layer exposed Post Procedure Diagnosis Same as Pre-procedure Electronic Signature(s) Signed: 07/19/2020 5:10:40 PM By: [name] Signed: 07/21/2020 7:47:56 AM By: Linton Ham MD Entered By: Linton Ham on 07/19/2020 11:03:42 -------------------------------------------------------------------------------- Debridement  Details Patient Name: Date of Service: [name] 07/19/2020 9:30 A M Medical Record Number: [id number] Patient Account Number: [id number] Date of Birth/Sex: Treating RN: 1945/06/23 (75 y.o. [name], [name] Primary Care Provider: [name], [name] Other Clinician: Referring Provider: Treating Provider/Extender: Aj Crunkleton [name], [name] Weeks in Treatment: 222 Debridement Performed for Assessment: Wound #32 Left,Distal,Dorsal Foot Performed By: Physician [name], MD Debridement Type: Debridement Severity of Tissue Pre Debridement: Fat layer exposed Level of Consciousness (Pre-procedure): Awake and Alert Pre-procedure Verification/Time Out Yes - 10:57 Taken: Start Time: 10:57 Pain Control: Lidocaine T Area Debrided (L x W): otal 1.5 (cm) x 1 (cm) = 1.5 (cm) Tissue and other material debrided: Viable, Non-Viable, Slough, Subcutaneous, Skin: Dermis , Skin: Epidermis, Slough Level: Skin/Subcutaneous Tissue Debridement Description: Excisional Instrument: Blade, Curette, Forceps Bleeding: Minimum Hemostasis Achieved: Pressure End Time: 10:58 Procedural Pain: 0 Post Procedural Pain: 0 Response to Treatment: Procedure was tolerated well Level of Consciousness (Post- Awake and Alert procedure): Post Debridement Measurements of Total Wound Length: (cm) 1.5 Width: (cm) 1 Depth: (cm) 0.3 Volume: (cm) 0.353 Character of Wound/Ulcer Post Debridement: Improved Severity of Tissue Post Debridement: Fat layer exposed Post Procedure Diagnosis Same as Pre-procedure Electronic Signature(s) Signed: 07/19/2020 5:10:40 PM By: [name] Signed: 07/21/2020 7:47:56 AM By: Linton Ham MD Entered By: Linton Ham on 07/19/2020 11:04:00 -------------------------------------------------------------------------------- Debridement Details Patient Name: Date of Service: [name] 07/19/2020 9:30 A M Medical Record Number: [id number] Patient  Account Number: [id number] Date of Birth/Sex: Treating RN: 03/25/1946 (75 y.o. [name], [name] Primary Care Provider: [name], [name] Other Clinician: Referring Provider: Treating Provider/Extender: Alexandr Oehler [name], [name] Weeks in Treatment: 222 Debridement Performed for Assessment: Wound #35 Left,Lateral,Dorsal Foot Performed By: Physician [name], MD Debridement Type: Debridement Severity of Tissue Pre Debridement: Fat layer exposed Level of Consciousness (Pre-procedure): Awake and Alert Pre-procedure Verification/Time Out Yes - 10:56 Taken: Start Time: 10:56 Pain Control: Lidocaine T Area Debrided (L x W): otal 1.4 (cm) x 1.5 (cm) =  Date of Service: [name] 07/19/2020 9:30 A M Medical Record Number: [id number] Patient Account Number: [id number] Date of Birth/Sex: Treating RN: 06/05/1945 (75 y.o. [name], [name] Primary Care Provider: [name], [name] Other Clinician: Referring Provider: Treating Provider/Extender: Elonda Giuliano [name], [name] Weeks in Treatment: 222 Verbal / Phone Orders: No Diagnosis Coding ICD-10 Coding Code Description E11.621 Type 2 diabetes mellitus with foot ulcer L89.613 Pressure ulcer of right heel, stage 3 L97.518 Non-pressure chronic ulcer of other part of right foot with other specified severity L97.221 Non-pressure chronic ulcer of left calf limited to breakdown of skin L97.321 Non-pressure chronic ulcer of left ankle limited to breakdown of skin L97.521 Non-pressure chronic ulcer of other part of left foot limited to breakdown of skin L97.524 Non-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 Follow-up Appointments Return Appointment in 2 weeks. Bathing/ Shower/ Hygiene May shower with protection but do not get wound dressing(s) wet. Edema Control - Lymphedema / SCD / Other Elevate legs to the level of the heart or above for 30 minutes daily and/or when sitting, a frequency of: Avoid standing for long periods of time. Off-Loading Other: - multipodus boots to both feet Wound Treatment Wound #14 - Lower Leg Wound Laterality: Left, Lateral Cleanser: Wound Cleanser (Generic) 1 x Per Week/15 Days Discharge Instructions: Cleanse the wound with wound cleanser prior to  applying a clean dressing using gauze sponges, not tissue or cotton balls. Cleanser: Soap and Water 1 x Per Week/15 Days Discharge Instructions: May shower and wash wound with dial antibacterial soap and water prior to dressing change. Peri-Wound Care: Triamcinolone 15 (g) 1 x Per Week/15 Days Discharge Instructions: Use triamcinolone 15 (g) as directed Peri-Wound Care: Sween Lotion (Moisturizing lotion) 1 x Per Week/15 Days Discharge Instructions: Apply moisturizing lotion as directed Prim Dressing: KerraCel Ag Gelling Fiber Dressing, 4x5 in (silver alginate) (Generic) 1 x Per Week/15 Days ary Discharge Instructions: Apply silver alginate to wound bed as instructed Secondary Dressing: Woven Gauze Sponge, Non-Sterile 4x4 in (Generic) 1 x Per Week/15 Days Discharge Instructions: Apply over primary dressing as directed. Secondary Dressing: ABD Pad, 5x9 1 x Per Week/15 Days Discharge Instructions: Apply over primary dressing as directed. Compression Wrap: ThreePress (3 layer compression wrap) 1 x Per Week/15 Days Discharge Instructions: Apply three layer compression as directed. Wound #25 - Lower Leg Wound Laterality: Right, Lateral Cleanser: Wound Cleanser (Generic) 1 x Per Week/15 Days Discharge Instructions: Cleanse the wound with wound cleanser prior to applying a clean dressing using gauze sponges, not tissue or cotton balls. Cleanser: Soap and Water 1 x Per Week/15 Days Discharge Instructions: May shower and wash wound with dial antibacterial soap and water prior to dressing change. Peri-Wound Care: Triamcinolone 15 (g) 1 x Per Week/15 Days Discharge Instructions: Use triamcinolone 15 (g) as directed Peri-Wound Care: Sween Lotion (Moisturizing lotion) 1 x Per Week/15 Days Discharge Instructions: Apply moisturizing lotion as directed Prim Dressing: KerraCel Ag Gelling Fiber Dressing, 4x5 in (silver alginate) (Generic) 1 x Per Week/15 Days ary Discharge Instructions: Apply silver alginate  to wound bed as instructed Secondary Dressing: Woven Gauze Sponge, Non-Sterile 4x4 in (Generic) 1 x Per Week/15 Days Discharge Instructions: Apply over primary dressing as directed. Secondary Dressing: ABD Pad, 5x9 1 x Per Week/15 Days Discharge Instructions: Apply over primary dressing as directed. Compression Wrap: ThreePress (3 layer compression wrap) 1 x Per Week/15 Days Discharge Instructions: Apply three layer compression as directed. Wound #26 - Foot Wound Laterality: Dorsal, Left  YENIEL, HARBOLD ([id number]) Visit Report for 07/19/2020 Debridement Details Patient Name: Date of Service: [name] [facility] F. 07/19/2020 9:30 A M Medical Record Number: [id number] Patient Account Number: [id number] Date of Birth/Sex: Treating RN: [birth year] (75 y.o. [name], [name] Primary Care Provider: [name], [name] Other Clinician: Referring Provider: Treating Provider/Extender: Anaisabel Pederson [name], [name] Weeks in Treatment: 222 Debridement Performed for Assessment: Wound #26 Left,Dorsal Foot Performed By: Physician [name], MD Debridement Type: Debridement Severity of Tissue Pre Debridement: Fat layer exposed Level of Consciousness (Pre-procedure): Awake and Alert Pre-procedure Verification/Time Out Yes - 10:55 Taken: Start Time: 10:55 Pain Control: Lidocaine T Area Debrided (L x W): otal 4 (cm) x 4 (cm) = 16 (cm) Tissue and other material debrided: Viable, Non-Viable, Slough, Subcutaneous, Tendon, Skin: Dermis , Skin: Epidermis, Slough Level: Skin/Subcutaneous Tissue/Muscle Debridement Description: Excisional Instrument: Blade, Curette, Forceps Specimen: Tissue Culture Number of Specimens T aken: 1 Bleeding: Minimum Hemostasis Achieved: Pressure End Time: 10:56 Procedural Pain: 0 Post Procedural Pain: 0 Response to Treatment: Procedure was tolerated well Level of Consciousness (Post- Awake and Alert procedure): Post Debridement Measurements of Total Wound Length: (cm) 4 Width: (cm) 4 Depth: (cm) 0.1 Volume: (cm) 1.257 Character of Wound/Ulcer Post Debridement: Improved Severity of Tissue Post Debridement: Fat layer exposed Post Procedure Diagnosis Same as Pre-procedure Electronic Signature(s) Signed: 07/19/2020 5:10:40 PM By: [name] Signed: 07/21/2020 7:47:56 AM By: Linton Ham MD Entered By: Linton Ham on 07/19/2020 11:03:42 -------------------------------------------------------------------------------- Debridement  Details Patient Name: Date of Service: [name] 07/19/2020 9:30 A M Medical Record Number: [id number] Patient Account Number: [id number] Date of Birth/Sex: Treating RN: 1945/06/23 (75 y.o. [name], [name] Primary Care Provider: [name], [name] Other Clinician: Referring Provider: Treating Provider/Extender: Aj Crunkleton [name], [name] Weeks in Treatment: 222 Debridement Performed for Assessment: Wound #32 Left,Distal,Dorsal Foot Performed By: Physician [name], MD Debridement Type: Debridement Severity of Tissue Pre Debridement: Fat layer exposed Level of Consciousness (Pre-procedure): Awake and Alert Pre-procedure Verification/Time Out Yes - 10:57 Taken: Start Time: 10:57 Pain Control: Lidocaine T Area Debrided (L x W): otal 1.5 (cm) x 1 (cm) = 1.5 (cm) Tissue and other material debrided: Viable, Non-Viable, Slough, Subcutaneous, Skin: Dermis , Skin: Epidermis, Slough Level: Skin/Subcutaneous Tissue Debridement Description: Excisional Instrument: Blade, Curette, Forceps Bleeding: Minimum Hemostasis Achieved: Pressure End Time: 10:58 Procedural Pain: 0 Post Procedural Pain: 0 Response to Treatment: Procedure was tolerated well Level of Consciousness (Post- Awake and Alert procedure): Post Debridement Measurements of Total Wound Length: (cm) 1.5 Width: (cm) 1 Depth: (cm) 0.3 Volume: (cm) 0.353 Character of Wound/Ulcer Post Debridement: Improved Severity of Tissue Post Debridement: Fat layer exposed Post Procedure Diagnosis Same as Pre-procedure Electronic Signature(s) Signed: 07/19/2020 5:10:40 PM By: [name] Signed: 07/21/2020 7:47:56 AM By: Linton Ham MD Entered By: Linton Ham on 07/19/2020 11:04:00 -------------------------------------------------------------------------------- Debridement Details Patient Name: Date of Service: [name] 07/19/2020 9:30 A M Medical Record Number: [id number] Patient  Account Number: [id number] Date of Birth/Sex: Treating RN: 03/25/1946 (75 y.o. [name], [name] Primary Care Provider: [name], [name] Other Clinician: Referring Provider: Treating Provider/Extender: Alexandr Oehler [name], [name] Weeks in Treatment: 222 Debridement Performed for Assessment: Wound #35 Left,Lateral,Dorsal Foot Performed By: Physician [name], MD Debridement Type: Debridement Severity of Tissue Pre Debridement: Fat layer exposed Level of Consciousness (Pre-procedure): Awake and Alert Pre-procedure Verification/Time Out Yes - 10:56 Taken: Start Time: 10:56 Pain Control: Lidocaine T Area Debrided (L x W): otal 1.4 (cm) x 1.5 (cm) =  [id number] Date of Birth/Sex: Treating RN: January 04, 1946 (75 y.o. [name], [name] Primary Care Provider: [name], [name] Other Clinician: Referring Provider: Treating Provider/Extender: Neilani Duffee [name], [name] Weeks in Treatment: 222 Active Problems ICD-10 Encounter Code Description Active Date MDM Diagnosis E11.621 Type 2 diabetes mellitus with foot ulcer 04/17/2016 No Yes L89.613 Pressure ulcer of right heel, stage 3 04/17/2016 No Yes L97.518 Non-pressure chronic ulcer of other part of right foot with other specified 05/17/2020 No Yes severity L97.221 Non-pressure chronic ulcer of left calf limited to breakdown of skin 08/15/2017 No Yes L97.321 Non-pressure chronic ulcer of left ankle limited to breakdown of skin 07/01/2018 No Yes L97.521 Non-pressure chronic ulcer of other part of left foot limited to breakdown of 02/17/2019 No Yes skin L97.524 Non-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 06/14/2020 No  Yes Inactive Problems ICD-10 Code Description Active Date Inactive Date L03.032 Cellulitis of left toe 01/06/2019 01/06/2019 [id number] Pressure ulcer of left heel, stage 3 12/04/2016 12/04/2016 I25.119 Atherosclerotic heart disease of native coronary artery with unspecified angina 04/17/2016 04/17/2016 pectoris L97.821 Non-pressure chronic ulcer of other part of left lower leg limited to breakdown of skin 01/06/2019 01/06/2019 S51.811D Laceration without foreign body of right forearm, subsequent encounter 10/22/2017 10/22/2017 [id number] Acute diastolic (congestive) heart failure 04/17/2016 04/17/2016 L03.116 Cellulitis of left lower limb 12/24/2017 12/24/2017 L89.620 Pressure ulcer of left heel, unstageable 07/21/2019 07/21/2019 L97.211 Non-pressure chronic ulcer of right calf limited to breakdown of skin 07/21/2019 07/21/2019 S80.211D Abrasion, right knee, subsequent encounter 08/18/2019 08/18/2019 Resolved Problems ICD-10 Code Description Active Date Resolved Date L89.512 Pressure ulcer of right ankle, stage 2 04/17/2016 04/17/2016 L89.522 Pressure ulcer of left ankle, stage 2 04/17/2016 04/17/2016 Electronic Signature(s) Signed: 07/21/2020 7:47:56 AM By: Linton Ham MD Entered By: Linton Ham on 07/19/2020 11:02:44 -------------------------------------------------------------------------------- Progress Note Details Patient Name: Date of Service: [name] 07/19/2020 9:30 A M Medical Record Number: [id number] Patient Account Number: [id number] Date of Birth/Sex: Treating RN: Feb 07, 1946 (75 y.o. [name], [name] Primary Care Provider: [name], [name] Other Clinician: Referring Provider: Treating Provider/Extender: Adean Milosevic [name], [name] Weeks in Treatment: 222 Subjective History of Present Illness (HPI) The following HPI elements were documented for the patient's wound: Location: On the left and right lateral forefoot which has been there for about 6 months Quality: Patient  reports No Pain. Severity: Patient states wound(s) are getting worse. Duration: Patient has had the wound for > 6 months prior to seeking treatment at the wound center Context: The wound would happen gradually Modifying Factors: Patient wound(s)/ulcer(s) are worsening due to :continual drainage from the wound Associated Signs and Symptoms: Patient reports having increase discharge. This patient returns after being seen here till the end of August and he was lost to follow-up. he has been quite debilitated laying in bed most of the time and his condition has deteriorated significantly. He has multiple ulcerations on the heel lateral forefoot and some of his toes. ======== Old notes: 75 year old male known to our practice when he was seen here in February and March and was lost to follow-up when he was admitted to hospital with various medical problems including coronary artery disease and a stroke. Now returns with the problem on the left forefoot where he has an ulceration and this has been there for about 6 months. most recently he was in hospital between July 6 and July 16, when he was admitted and treated for acute respiratory failure is secondary to aspiration pneumonia, large non-STEMI, ischemic cardiomyopathy  Date of Service: [name] 07/19/2020 9:30 A M Medical Record Number: [id number] Patient Account Number: [id number] Date of Birth/Sex: Treating RN: 06/05/1945 (75 y.o. [name], [name] Primary Care Provider: [name], [name] Other Clinician: Referring Provider: Treating Provider/Extender: Elonda Giuliano [name], [name] Weeks in Treatment: 222 Verbal / Phone Orders: No Diagnosis Coding ICD-10 Coding Code Description E11.621 Type 2 diabetes mellitus with foot ulcer L89.613 Pressure ulcer of right heel, stage 3 L97.518 Non-pressure chronic ulcer of other part of right foot with other specified severity L97.221 Non-pressure chronic ulcer of left calf limited to breakdown of skin L97.321 Non-pressure chronic ulcer of left ankle limited to breakdown of skin L97.521 Non-pressure chronic ulcer of other part of left foot limited to breakdown of skin L97.524 Non-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 Follow-up Appointments Return Appointment in 2 weeks. Bathing/ Shower/ Hygiene May shower with protection but do not get wound dressing(s) wet. Edema Control - Lymphedema / SCD / Other Elevate legs to the level of the heart or above for 30 minutes daily and/or when sitting, a frequency of: Avoid standing for long periods of time. Off-Loading Other: - multipodus boots to both feet Wound Treatment Wound #14 - Lower Leg Wound Laterality: Left, Lateral Cleanser: Wound Cleanser (Generic) 1 x Per Week/15 Days Discharge Instructions: Cleanse the wound with wound cleanser prior to  applying a clean dressing using gauze sponges, not tissue or cotton balls. Cleanser: Soap and Water 1 x Per Week/15 Days Discharge Instructions: May shower and wash wound with dial antibacterial soap and water prior to dressing change. Peri-Wound Care: Triamcinolone 15 (g) 1 x Per Week/15 Days Discharge Instructions: Use triamcinolone 15 (g) as directed Peri-Wound Care: Sween Lotion (Moisturizing lotion) 1 x Per Week/15 Days Discharge Instructions: Apply moisturizing lotion as directed Prim Dressing: KerraCel Ag Gelling Fiber Dressing, 4x5 in (silver alginate) (Generic) 1 x Per Week/15 Days ary Discharge Instructions: Apply silver alginate to wound bed as instructed Secondary Dressing: Woven Gauze Sponge, Non-Sterile 4x4 in (Generic) 1 x Per Week/15 Days Discharge Instructions: Apply over primary dressing as directed. Secondary Dressing: ABD Pad, 5x9 1 x Per Week/15 Days Discharge Instructions: Apply over primary dressing as directed. Compression Wrap: ThreePress (3 layer compression wrap) 1 x Per Week/15 Days Discharge Instructions: Apply three layer compression as directed. Wound #25 - Lower Leg Wound Laterality: Right, Lateral Cleanser: Wound Cleanser (Generic) 1 x Per Week/15 Days Discharge Instructions: Cleanse the wound with wound cleanser prior to applying a clean dressing using gauze sponges, not tissue or cotton balls. Cleanser: Soap and Water 1 x Per Week/15 Days Discharge Instructions: May shower and wash wound with dial antibacterial soap and water prior to dressing change. Peri-Wound Care: Triamcinolone 15 (g) 1 x Per Week/15 Days Discharge Instructions: Use triamcinolone 15 (g) as directed Peri-Wound Care: Sween Lotion (Moisturizing lotion) 1 x Per Week/15 Days Discharge Instructions: Apply moisturizing lotion as directed Prim Dressing: KerraCel Ag Gelling Fiber Dressing, 4x5 in (silver alginate) (Generic) 1 x Per Week/15 Days ary Discharge Instructions: Apply silver alginate  to wound bed as instructed Secondary Dressing: Woven Gauze Sponge, Non-Sterile 4x4 in (Generic) 1 x Per Week/15 Days Discharge Instructions: Apply over primary dressing as directed. Secondary Dressing: ABD Pad, 5x9 1 x Per Week/15 Days Discharge Instructions: Apply over primary dressing as directed. Compression Wrap: ThreePress (3 layer compression wrap) 1 x Per Week/15 Days Discharge Instructions: Apply three layer compression as directed. Wound #26 - Foot Wound Laterality: Dorsal, Left  YENIEL, HARBOLD ([id number]) Visit Report for 07/19/2020 Debridement Details Patient Name: Date of Service: [name] [facility] F. 07/19/2020 9:30 A M Medical Record Number: [id number] Patient Account Number: [id number] Date of Birth/Sex: Treating RN: [birth year] (75 y.o. [name], [name] Primary Care Provider: [name], [name] Other Clinician: Referring Provider: Treating Provider/Extender: Anaisabel Pederson [name], [name] Weeks in Treatment: 222 Debridement Performed for Assessment: Wound #26 Left,Dorsal Foot Performed By: Physician [name], MD Debridement Type: Debridement Severity of Tissue Pre Debridement: Fat layer exposed Level of Consciousness (Pre-procedure): Awake and Alert Pre-procedure Verification/Time Out Yes - 10:55 Taken: Start Time: 10:55 Pain Control: Lidocaine T Area Debrided (L x W): otal 4 (cm) x 4 (cm) = 16 (cm) Tissue and other material debrided: Viable, Non-Viable, Slough, Subcutaneous, Tendon, Skin: Dermis , Skin: Epidermis, Slough Level: Skin/Subcutaneous Tissue/Muscle Debridement Description: Excisional Instrument: Blade, Curette, Forceps Specimen: Tissue Culture Number of Specimens T aken: 1 Bleeding: Minimum Hemostasis Achieved: Pressure End Time: 10:56 Procedural Pain: 0 Post Procedural Pain: 0 Response to Treatment: Procedure was tolerated well Level of Consciousness (Post- Awake and Alert procedure): Post Debridement Measurements of Total Wound Length: (cm) 4 Width: (cm) 4 Depth: (cm) 0.1 Volume: (cm) 1.257 Character of Wound/Ulcer Post Debridement: Improved Severity of Tissue Post Debridement: Fat layer exposed Post Procedure Diagnosis Same as Pre-procedure Electronic Signature(s) Signed: 07/19/2020 5:10:40 PM By: [name] Signed: 07/21/2020 7:47:56 AM By: Linton Ham MD Entered By: Linton Ham on 07/19/2020 11:03:42 -------------------------------------------------------------------------------- Debridement  Details Patient Name: Date of Service: [name] 07/19/2020 9:30 A M Medical Record Number: [id number] Patient Account Number: [id number] Date of Birth/Sex: Treating RN: 1945/06/23 (75 y.o. [name], [name] Primary Care Provider: [name], [name] Other Clinician: Referring Provider: Treating Provider/Extender: Aj Crunkleton [name], [name] Weeks in Treatment: 222 Debridement Performed for Assessment: Wound #32 Left,Distal,Dorsal Foot Performed By: Physician [name], MD Debridement Type: Debridement Severity of Tissue Pre Debridement: Fat layer exposed Level of Consciousness (Pre-procedure): Awake and Alert Pre-procedure Verification/Time Out Yes - 10:57 Taken: Start Time: 10:57 Pain Control: Lidocaine T Area Debrided (L x W): otal 1.5 (cm) x 1 (cm) = 1.5 (cm) Tissue and other material debrided: Viable, Non-Viable, Slough, Subcutaneous, Skin: Dermis , Skin: Epidermis, Slough Level: Skin/Subcutaneous Tissue Debridement Description: Excisional Instrument: Blade, Curette, Forceps Bleeding: Minimum Hemostasis Achieved: Pressure End Time: 10:58 Procedural Pain: 0 Post Procedural Pain: 0 Response to Treatment: Procedure was tolerated well Level of Consciousness (Post- Awake and Alert procedure): Post Debridement Measurements of Total Wound Length: (cm) 1.5 Width: (cm) 1 Depth: (cm) 0.3 Volume: (cm) 0.353 Character of Wound/Ulcer Post Debridement: Improved Severity of Tissue Post Debridement: Fat layer exposed Post Procedure Diagnosis Same as Pre-procedure Electronic Signature(s) Signed: 07/19/2020 5:10:40 PM By: [name] Signed: 07/21/2020 7:47:56 AM By: Linton Ham MD Entered By: Linton Ham on 07/19/2020 11:04:00 -------------------------------------------------------------------------------- Debridement Details Patient Name: Date of Service: [name] 07/19/2020 9:30 A M Medical Record Number: [id number] Patient  Account Number: [id number] Date of Birth/Sex: Treating RN: 03/25/1946 (75 y.o. [name], [name] Primary Care Provider: [name], [name] Other Clinician: Referring Provider: Treating Provider/Extender: Alexandr Oehler [name], [name] Weeks in Treatment: 222 Debridement Performed for Assessment: Wound #35 Left,Lateral,Dorsal Foot Performed By: Physician [name], MD Debridement Type: Debridement Severity of Tissue Pre Debridement: Fat layer exposed Level of Consciousness (Pre-procedure): Awake and Alert Pre-procedure Verification/Time Out Yes - 10:56 Taken: Start Time: 10:56 Pain Control: Lidocaine T Area Debrided (L x W): otal 1.4 (cm) x 1.5 (cm) =  Date of Service: [name] 07/19/2020 9:30 A M Medical Record Number: [id number] Patient Account Number: [id number] Date of Birth/Sex: Treating RN: 06/05/1945 (75 y.o. [name], [name] Primary Care Provider: [name], [name] Other Clinician: Referring Provider: Treating Provider/Extender: Elonda Giuliano [name], [name] Weeks in Treatment: 222 Verbal / Phone Orders: No Diagnosis Coding ICD-10 Coding Code Description E11.621 Type 2 diabetes mellitus with foot ulcer L89.613 Pressure ulcer of right heel, stage 3 L97.518 Non-pressure chronic ulcer of other part of right foot with other specified severity L97.221 Non-pressure chronic ulcer of left calf limited to breakdown of skin L97.321 Non-pressure chronic ulcer of left ankle limited to breakdown of skin L97.521 Non-pressure chronic ulcer of other part of left foot limited to breakdown of skin L97.524 Non-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 Follow-up Appointments Return Appointment in 2 weeks. Bathing/ Shower/ Hygiene May shower with protection but do not get wound dressing(s) wet. Edema Control - Lymphedema / SCD / Other Elevate legs to the level of the heart or above for 30 minutes daily and/or when sitting, a frequency of: Avoid standing for long periods of time. Off-Loading Other: - multipodus boots to both feet Wound Treatment Wound #14 - Lower Leg Wound Laterality: Left, Lateral Cleanser: Wound Cleanser (Generic) 1 x Per Week/15 Days Discharge Instructions: Cleanse the wound with wound cleanser prior to  applying a clean dressing using gauze sponges, not tissue or cotton balls. Cleanser: Soap and Water 1 x Per Week/15 Days Discharge Instructions: May shower and wash wound with dial antibacterial soap and water prior to dressing change. Peri-Wound Care: Triamcinolone 15 (g) 1 x Per Week/15 Days Discharge Instructions: Use triamcinolone 15 (g) as directed Peri-Wound Care: Sween Lotion (Moisturizing lotion) 1 x Per Week/15 Days Discharge Instructions: Apply moisturizing lotion as directed Prim Dressing: KerraCel Ag Gelling Fiber Dressing, 4x5 in (silver alginate) (Generic) 1 x Per Week/15 Days ary Discharge Instructions: Apply silver alginate to wound bed as instructed Secondary Dressing: Woven Gauze Sponge, Non-Sterile 4x4 in (Generic) 1 x Per Week/15 Days Discharge Instructions: Apply over primary dressing as directed. Secondary Dressing: ABD Pad, 5x9 1 x Per Week/15 Days Discharge Instructions: Apply over primary dressing as directed. Compression Wrap: ThreePress (3 layer compression wrap) 1 x Per Week/15 Days Discharge Instructions: Apply three layer compression as directed. Wound #25 - Lower Leg Wound Laterality: Right, Lateral Cleanser: Wound Cleanser (Generic) 1 x Per Week/15 Days Discharge Instructions: Cleanse the wound with wound cleanser prior to applying a clean dressing using gauze sponges, not tissue or cotton balls. Cleanser: Soap and Water 1 x Per Week/15 Days Discharge Instructions: May shower and wash wound with dial antibacterial soap and water prior to dressing change. Peri-Wound Care: Triamcinolone 15 (g) 1 x Per Week/15 Days Discharge Instructions: Use triamcinolone 15 (g) as directed Peri-Wound Care: Sween Lotion (Moisturizing lotion) 1 x Per Week/15 Days Discharge Instructions: Apply moisturizing lotion as directed Prim Dressing: KerraCel Ag Gelling Fiber Dressing, 4x5 in (silver alginate) (Generic) 1 x Per Week/15 Days ary Discharge Instructions: Apply silver alginate  to wound bed as instructed Secondary Dressing: Woven Gauze Sponge, Non-Sterile 4x4 in (Generic) 1 x Per Week/15 Days Discharge Instructions: Apply over primary dressing as directed. Secondary Dressing: ABD Pad, 5x9 1 x Per Week/15 Days Discharge Instructions: Apply over primary dressing as directed. Compression Wrap: ThreePress (3 layer compression wrap) 1 x Per Week/15 Days Discharge Instructions: Apply three layer compression as directed. Wound #26 - Foot Wound Laterality: Dorsal, Left  YENIEL, HARBOLD ([id number]) Visit Report for 07/19/2020 Debridement Details Patient Name: Date of Service: [name] [facility] F. 07/19/2020 9:30 A M Medical Record Number: [id number] Patient Account Number: [id number] Date of Birth/Sex: Treating RN: [birth year] (75 y.o. [name], [name] Primary Care Provider: [name], [name] Other Clinician: Referring Provider: Treating Provider/Extender: Anaisabel Pederson [name], [name] Weeks in Treatment: 222 Debridement Performed for Assessment: Wound #26 Left,Dorsal Foot Performed By: Physician [name], MD Debridement Type: Debridement Severity of Tissue Pre Debridement: Fat layer exposed Level of Consciousness (Pre-procedure): Awake and Alert Pre-procedure Verification/Time Out Yes - 10:55 Taken: Start Time: 10:55 Pain Control: Lidocaine T Area Debrided (L x W): otal 4 (cm) x 4 (cm) = 16 (cm) Tissue and other material debrided: Viable, Non-Viable, Slough, Subcutaneous, Tendon, Skin: Dermis , Skin: Epidermis, Slough Level: Skin/Subcutaneous Tissue/Muscle Debridement Description: Excisional Instrument: Blade, Curette, Forceps Specimen: Tissue Culture Number of Specimens T aken: 1 Bleeding: Minimum Hemostasis Achieved: Pressure End Time: 10:56 Procedural Pain: 0 Post Procedural Pain: 0 Response to Treatment: Procedure was tolerated well Level of Consciousness (Post- Awake and Alert procedure): Post Debridement Measurements of Total Wound Length: (cm) 4 Width: (cm) 4 Depth: (cm) 0.1 Volume: (cm) 1.257 Character of Wound/Ulcer Post Debridement: Improved Severity of Tissue Post Debridement: Fat layer exposed Post Procedure Diagnosis Same as Pre-procedure Electronic Signature(s) Signed: 07/19/2020 5:10:40 PM By: [name] Signed: 07/21/2020 7:47:56 AM By: Linton Ham MD Entered By: Linton Ham on 07/19/2020 11:03:42 -------------------------------------------------------------------------------- Debridement  Details Patient Name: Date of Service: [name] 07/19/2020 9:30 A M Medical Record Number: [id number] Patient Account Number: [id number] Date of Birth/Sex: Treating RN: 1945/06/23 (75 y.o. [name], [name] Primary Care Provider: [name], [name] Other Clinician: Referring Provider: Treating Provider/Extender: Aj Crunkleton [name], [name] Weeks in Treatment: 222 Debridement Performed for Assessment: Wound #32 Left,Distal,Dorsal Foot Performed By: Physician [name], MD Debridement Type: Debridement Severity of Tissue Pre Debridement: Fat layer exposed Level of Consciousness (Pre-procedure): Awake and Alert Pre-procedure Verification/Time Out Yes - 10:57 Taken: Start Time: 10:57 Pain Control: Lidocaine T Area Debrided (L x W): otal 1.5 (cm) x 1 (cm) = 1.5 (cm) Tissue and other material debrided: Viable, Non-Viable, Slough, Subcutaneous, Skin: Dermis , Skin: Epidermis, Slough Level: Skin/Subcutaneous Tissue Debridement Description: Excisional Instrument: Blade, Curette, Forceps Bleeding: Minimum Hemostasis Achieved: Pressure End Time: 10:58 Procedural Pain: 0 Post Procedural Pain: 0 Response to Treatment: Procedure was tolerated well Level of Consciousness (Post- Awake and Alert procedure): Post Debridement Measurements of Total Wound Length: (cm) 1.5 Width: (cm) 1 Depth: (cm) 0.3 Volume: (cm) 0.353 Character of Wound/Ulcer Post Debridement: Improved Severity of Tissue Post Debridement: Fat layer exposed Post Procedure Diagnosis Same as Pre-procedure Electronic Signature(s) Signed: 07/19/2020 5:10:40 PM By: [name] Signed: 07/21/2020 7:47:56 AM By: Linton Ham MD Entered By: Linton Ham on 07/19/2020 11:04:00 -------------------------------------------------------------------------------- Debridement Details Patient Name: Date of Service: [name] 07/19/2020 9:30 A M Medical Record Number: [id number] Patient  Account Number: [id number] Date of Birth/Sex: Treating RN: 03/25/1946 (75 y.o. [name], [name] Primary Care Provider: [name], [name] Other Clinician: Referring Provider: Treating Provider/Extender: Alexandr Oehler [name], [name] Weeks in Treatment: 222 Debridement Performed for Assessment: Wound #35 Left,Lateral,Dorsal Foot Performed By: Physician [name], MD Debridement Type: Debridement Severity of Tissue Pre Debridement: Fat layer exposed Level of Consciousness (Pre-procedure): Awake and Alert Pre-procedure Verification/Time Out Yes - 10:56 Taken: Start Time: 10:56 Pain Control: Lidocaine T Area Debrided (L x W): otal 1.4 (cm) x 1.5 (cm) =  YENIEL, HARBOLD ([id number]) Visit Report for 07/19/2020 Debridement Details Patient Name: Date of Service: [name] [facility] F. 07/19/2020 9:30 A M Medical Record Number: [id number] Patient Account Number: [id number] Date of Birth/Sex: Treating RN: [birth year] (75 y.o. [name], [name] Primary Care Provider: [name], [name] Other Clinician: Referring Provider: Treating Provider/Extender: Anaisabel Pederson [name], [name] Weeks in Treatment: 222 Debridement Performed for Assessment: Wound #26 Left,Dorsal Foot Performed By: Physician [name], MD Debridement Type: Debridement Severity of Tissue Pre Debridement: Fat layer exposed Level of Consciousness (Pre-procedure): Awake and Alert Pre-procedure Verification/Time Out Yes - 10:55 Taken: Start Time: 10:55 Pain Control: Lidocaine T Area Debrided (L x W): otal 4 (cm) x 4 (cm) = 16 (cm) Tissue and other material debrided: Viable, Non-Viable, Slough, Subcutaneous, Tendon, Skin: Dermis , Skin: Epidermis, Slough Level: Skin/Subcutaneous Tissue/Muscle Debridement Description: Excisional Instrument: Blade, Curette, Forceps Specimen: Tissue Culture Number of Specimens T aken: 1 Bleeding: Minimum Hemostasis Achieved: Pressure End Time: 10:56 Procedural Pain: 0 Post Procedural Pain: 0 Response to Treatment: Procedure was tolerated well Level of Consciousness (Post- Awake and Alert procedure): Post Debridement Measurements of Total Wound Length: (cm) 4 Width: (cm) 4 Depth: (cm) 0.1 Volume: (cm) 1.257 Character of Wound/Ulcer Post Debridement: Improved Severity of Tissue Post Debridement: Fat layer exposed Post Procedure Diagnosis Same as Pre-procedure Electronic Signature(s) Signed: 07/19/2020 5:10:40 PM By: [name] Signed: 07/21/2020 7:47:56 AM By: Linton Ham MD Entered By: Linton Ham on 07/19/2020 11:03:42 -------------------------------------------------------------------------------- Debridement  Details Patient Name: Date of Service: [name] 07/19/2020 9:30 A M Medical Record Number: [id number] Patient Account Number: [id number] Date of Birth/Sex: Treating RN: 1945/06/23 (75 y.o. [name], [name] Primary Care Provider: [name], [name] Other Clinician: Referring Provider: Treating Provider/Extender: Aj Crunkleton [name], [name] Weeks in Treatment: 222 Debridement Performed for Assessment: Wound #32 Left,Distal,Dorsal Foot Performed By: Physician [name], MD Debridement Type: Debridement Severity of Tissue Pre Debridement: Fat layer exposed Level of Consciousness (Pre-procedure): Awake and Alert Pre-procedure Verification/Time Out Yes - 10:57 Taken: Start Time: 10:57 Pain Control: Lidocaine T Area Debrided (L x W): otal 1.5 (cm) x 1 (cm) = 1.5 (cm) Tissue and other material debrided: Viable, Non-Viable, Slough, Subcutaneous, Skin: Dermis , Skin: Epidermis, Slough Level: Skin/Subcutaneous Tissue Debridement Description: Excisional Instrument: Blade, Curette, Forceps Bleeding: Minimum Hemostasis Achieved: Pressure End Time: 10:58 Procedural Pain: 0 Post Procedural Pain: 0 Response to Treatment: Procedure was tolerated well Level of Consciousness (Post- Awake and Alert procedure): Post Debridement Measurements of Total Wound Length: (cm) 1.5 Width: (cm) 1 Depth: (cm) 0.3 Volume: (cm) 0.353 Character of Wound/Ulcer Post Debridement: Improved Severity of Tissue Post Debridement: Fat layer exposed Post Procedure Diagnosis Same as Pre-procedure Electronic Signature(s) Signed: 07/19/2020 5:10:40 PM By: [name] Signed: 07/21/2020 7:47:56 AM By: Linton Ham MD Entered By: Linton Ham on 07/19/2020 11:04:00 -------------------------------------------------------------------------------- Debridement Details Patient Name: Date of Service: [name] 07/19/2020 9:30 A M Medical Record Number: [id number] Patient  Account Number: [id number] Date of Birth/Sex: Treating RN: 03/25/1946 (75 y.o. [name], [name] Primary Care Provider: [name], [name] Other Clinician: Referring Provider: Treating Provider/Extender: Alexandr Oehler [name], [name] Weeks in Treatment: 222 Debridement Performed for Assessment: Wound #35 Left,Lateral,Dorsal Foot Performed By: Physician [name], MD Debridement Type: Debridement Severity of Tissue Pre Debridement: Fat layer exposed Level of Consciousness (Pre-procedure): Awake and Alert Pre-procedure Verification/Time Out Yes - 10:56 Taken: Start Time: 10:56 Pain Control: Lidocaine T Area Debrided (L x W): otal 1.4 (cm) x 1.5 (cm) =  YENIEL, HARBOLD ([id number]) Visit Report for 07/19/2020 Debridement Details Patient Name: Date of Service: [name] [facility] F. 07/19/2020 9:30 A M Medical Record Number: [id number] Patient Account Number: [id number] Date of Birth/Sex: Treating RN: [birth year] (75 y.o. [name], [name] Primary Care Provider: [name], [name] Other Clinician: Referring Provider: Treating Provider/Extender: Anaisabel Pederson [name], [name] Weeks in Treatment: 222 Debridement Performed for Assessment: Wound #26 Left,Dorsal Foot Performed By: Physician [name], MD Debridement Type: Debridement Severity of Tissue Pre Debridement: Fat layer exposed Level of Consciousness (Pre-procedure): Awake and Alert Pre-procedure Verification/Time Out Yes - 10:55 Taken: Start Time: 10:55 Pain Control: Lidocaine T Area Debrided (L x W): otal 4 (cm) x 4 (cm) = 16 (cm) Tissue and other material debrided: Viable, Non-Viable, Slough, Subcutaneous, Tendon, Skin: Dermis , Skin: Epidermis, Slough Level: Skin/Subcutaneous Tissue/Muscle Debridement Description: Excisional Instrument: Blade, Curette, Forceps Specimen: Tissue Culture Number of Specimens T aken: 1 Bleeding: Minimum Hemostasis Achieved: Pressure End Time: 10:56 Procedural Pain: 0 Post Procedural Pain: 0 Response to Treatment: Procedure was tolerated well Level of Consciousness (Post- Awake and Alert procedure): Post Debridement Measurements of Total Wound Length: (cm) 4 Width: (cm) 4 Depth: (cm) 0.1 Volume: (cm) 1.257 Character of Wound/Ulcer Post Debridement: Improved Severity of Tissue Post Debridement: Fat layer exposed Post Procedure Diagnosis Same as Pre-procedure Electronic Signature(s) Signed: 07/19/2020 5:10:40 PM By: [name] Signed: 07/21/2020 7:47:56 AM By: Linton Ham MD Entered By: Linton Ham on 07/19/2020 11:03:42 -------------------------------------------------------------------------------- Debridement  Details Patient Name: Date of Service: [name] 07/19/2020 9:30 A M Medical Record Number: [id number] Patient Account Number: [id number] Date of Birth/Sex: Treating RN: 1945/06/23 (75 y.o. [name], [name] Primary Care Provider: [name], [name] Other Clinician: Referring Provider: Treating Provider/Extender: Aj Crunkleton [name], [name] Weeks in Treatment: 222 Debridement Performed for Assessment: Wound #32 Left,Distal,Dorsal Foot Performed By: Physician [name], MD Debridement Type: Debridement Severity of Tissue Pre Debridement: Fat layer exposed Level of Consciousness (Pre-procedure): Awake and Alert Pre-procedure Verification/Time Out Yes - 10:57 Taken: Start Time: 10:57 Pain Control: Lidocaine T Area Debrided (L x W): otal 1.5 (cm) x 1 (cm) = 1.5 (cm) Tissue and other material debrided: Viable, Non-Viable, Slough, Subcutaneous, Skin: Dermis , Skin: Epidermis, Slough Level: Skin/Subcutaneous Tissue Debridement Description: Excisional Instrument: Blade, Curette, Forceps Bleeding: Minimum Hemostasis Achieved: Pressure End Time: 10:58 Procedural Pain: 0 Post Procedural Pain: 0 Response to Treatment: Procedure was tolerated well Level of Consciousness (Post- Awake and Alert procedure): Post Debridement Measurements of Total Wound Length: (cm) 1.5 Width: (cm) 1 Depth: (cm) 0.3 Volume: (cm) 0.353 Character of Wound/Ulcer Post Debridement: Improved Severity of Tissue Post Debridement: Fat layer exposed Post Procedure Diagnosis Same as Pre-procedure Electronic Signature(s) Signed: 07/19/2020 5:10:40 PM By: [name] Signed: 07/21/2020 7:47:56 AM By: Linton Ham MD Entered By: Linton Ham on 07/19/2020 11:04:00 -------------------------------------------------------------------------------- Debridement Details Patient Name: Date of Service: [name] 07/19/2020 9:30 A M Medical Record Number: [id number] Patient  Account Number: [id number] Date of Birth/Sex: Treating RN: 03/25/1946 (75 y.o. [name], [name] Primary Care Provider: [name], [name] Other Clinician: Referring Provider: Treating Provider/Extender: Alexandr Oehler [name], [name] Weeks in Treatment: 222 Debridement Performed for Assessment: Wound #35 Left,Lateral,Dorsal Foot Performed By: Physician [name], MD Debridement Type: Debridement Severity of Tissue Pre Debridement: Fat layer exposed Level of Consciousness (Pre-procedure): Awake and Alert Pre-procedure Verification/Time Out Yes - 10:56 Taken: Start Time: 10:56 Pain Control: Lidocaine T Area Debrided (L x W): otal 1.4 (cm) x 1.5 (cm) =  YENIEL, HARBOLD ([id number]) Visit Report for 07/19/2020 Debridement Details Patient Name: Date of Service: [name] [facility] F. 07/19/2020 9:30 A M Medical Record Number: [id number] Patient Account Number: [id number] Date of Birth/Sex: Treating RN: [birth year] (75 y.o. [name], [name] Primary Care Provider: [name], [name] Other Clinician: Referring Provider: Treating Provider/Extender: Anaisabel Pederson [name], [name] Weeks in Treatment: 222 Debridement Performed for Assessment: Wound #26 Left,Dorsal Foot Performed By: Physician [name], MD Debridement Type: Debridement Severity of Tissue Pre Debridement: Fat layer exposed Level of Consciousness (Pre-procedure): Awake and Alert Pre-procedure Verification/Time Out Yes - 10:55 Taken: Start Time: 10:55 Pain Control: Lidocaine T Area Debrided (L x W): otal 4 (cm) x 4 (cm) = 16 (cm) Tissue and other material debrided: Viable, Non-Viable, Slough, Subcutaneous, Tendon, Skin: Dermis , Skin: Epidermis, Slough Level: Skin/Subcutaneous Tissue/Muscle Debridement Description: Excisional Instrument: Blade, Curette, Forceps Specimen: Tissue Culture Number of Specimens T aken: 1 Bleeding: Minimum Hemostasis Achieved: Pressure End Time: 10:56 Procedural Pain: 0 Post Procedural Pain: 0 Response to Treatment: Procedure was tolerated well Level of Consciousness (Post- Awake and Alert procedure): Post Debridement Measurements of Total Wound Length: (cm) 4 Width: (cm) 4 Depth: (cm) 0.1 Volume: (cm) 1.257 Character of Wound/Ulcer Post Debridement: Improved Severity of Tissue Post Debridement: Fat layer exposed Post Procedure Diagnosis Same as Pre-procedure Electronic Signature(s) Signed: 07/19/2020 5:10:40 PM By: [name] Signed: 07/21/2020 7:47:56 AM By: Linton Ham MD Entered By: Linton Ham on 07/19/2020 11:03:42 -------------------------------------------------------------------------------- Debridement  Details Patient Name: Date of Service: [name] 07/19/2020 9:30 A M Medical Record Number: [id number] Patient Account Number: [id number] Date of Birth/Sex: Treating RN: 1945/06/23 (75 y.o. [name], [name] Primary Care Provider: [name], [name] Other Clinician: Referring Provider: Treating Provider/Extender: Aj Crunkleton [name], [name] Weeks in Treatment: 222 Debridement Performed for Assessment: Wound #32 Left,Distal,Dorsal Foot Performed By: Physician [name], MD Debridement Type: Debridement Severity of Tissue Pre Debridement: Fat layer exposed Level of Consciousness (Pre-procedure): Awake and Alert Pre-procedure Verification/Time Out Yes - 10:57 Taken: Start Time: 10:57 Pain Control: Lidocaine T Area Debrided (L x W): otal 1.5 (cm) x 1 (cm) = 1.5 (cm) Tissue and other material debrided: Viable, Non-Viable, Slough, Subcutaneous, Skin: Dermis , Skin: Epidermis, Slough Level: Skin/Subcutaneous Tissue Debridement Description: Excisional Instrument: Blade, Curette, Forceps Bleeding: Minimum Hemostasis Achieved: Pressure End Time: 10:58 Procedural Pain: 0 Post Procedural Pain: 0 Response to Treatment: Procedure was tolerated well Level of Consciousness (Post- Awake and Alert procedure): Post Debridement Measurements of Total Wound Length: (cm) 1.5 Width: (cm) 1 Depth: (cm) 0.3 Volume: (cm) 0.353 Character of Wound/Ulcer Post Debridement: Improved Severity of Tissue Post Debridement: Fat layer exposed Post Procedure Diagnosis Same as Pre-procedure Electronic Signature(s) Signed: 07/19/2020 5:10:40 PM By: [name] Signed: 07/21/2020 7:47:56 AM By: Linton Ham MD Entered By: Linton Ham on 07/19/2020 11:04:00 -------------------------------------------------------------------------------- Debridement Details Patient Name: Date of Service: [name] 07/19/2020 9:30 A M Medical Record Number: [id number] Patient  Account Number: [id number] Date of Birth/Sex: Treating RN: 03/25/1946 (75 y.o. [name], [name] Primary Care Provider: [name], [name] Other Clinician: Referring Provider: Treating Provider/Extender: Alexandr Oehler [name], [name] Weeks in Treatment: 222 Debridement Performed for Assessment: Wound #35 Left,Lateral,Dorsal Foot Performed By: Physician [name], MD Debridement Type: Debridement Severity of Tissue Pre Debridement: Fat layer exposed Level of Consciousness (Pre-procedure): Awake and Alert Pre-procedure Verification/Time Out Yes - 10:56 Taken: Start Time: 10:56 Pain Control: Lidocaine T Area Debrided (L x W): otal 1.4 (cm) x 1.5 (cm) =  YENIEL, HARBOLD ([id number]) Visit Report for 07/19/2020 Debridement Details Patient Name: Date of Service: [name] [facility] F. 07/19/2020 9:30 A M Medical Record Number: [id number] Patient Account Number: [id number] Date of Birth/Sex: Treating RN: [birth year] (75 y.o. [name], [name] Primary Care Provider: [name], [name] Other Clinician: Referring Provider: Treating Provider/Extender: Anaisabel Pederson [name], [name] Weeks in Treatment: 222 Debridement Performed for Assessment: Wound #26 Left,Dorsal Foot Performed By: Physician [name], MD Debridement Type: Debridement Severity of Tissue Pre Debridement: Fat layer exposed Level of Consciousness (Pre-procedure): Awake and Alert Pre-procedure Verification/Time Out Yes - 10:55 Taken: Start Time: 10:55 Pain Control: Lidocaine T Area Debrided (L x W): otal 4 (cm) x 4 (cm) = 16 (cm) Tissue and other material debrided: Viable, Non-Viable, Slough, Subcutaneous, Tendon, Skin: Dermis , Skin: Epidermis, Slough Level: Skin/Subcutaneous Tissue/Muscle Debridement Description: Excisional Instrument: Blade, Curette, Forceps Specimen: Tissue Culture Number of Specimens T aken: 1 Bleeding: Minimum Hemostasis Achieved: Pressure End Time: 10:56 Procedural Pain: 0 Post Procedural Pain: 0 Response to Treatment: Procedure was tolerated well Level of Consciousness (Post- Awake and Alert procedure): Post Debridement Measurements of Total Wound Length: (cm) 4 Width: (cm) 4 Depth: (cm) 0.1 Volume: (cm) 1.257 Character of Wound/Ulcer Post Debridement: Improved Severity of Tissue Post Debridement: Fat layer exposed Post Procedure Diagnosis Same as Pre-procedure Electronic Signature(s) Signed: 07/19/2020 5:10:40 PM By: [name] Signed: 07/21/2020 7:47:56 AM By: Linton Ham MD Entered By: Linton Ham on 07/19/2020 11:03:42 -------------------------------------------------------------------------------- Debridement  Details Patient Name: Date of Service: [name] 07/19/2020 9:30 A M Medical Record Number: [id number] Patient Account Number: [id number] Date of Birth/Sex: Treating RN: 1945/06/23 (75 y.o. [name], [name] Primary Care Provider: [name], [name] Other Clinician: Referring Provider: Treating Provider/Extender: Aj Crunkleton [name], [name] Weeks in Treatment: 222 Debridement Performed for Assessment: Wound #32 Left,Distal,Dorsal Foot Performed By: Physician [name], MD Debridement Type: Debridement Severity of Tissue Pre Debridement: Fat layer exposed Level of Consciousness (Pre-procedure): Awake and Alert Pre-procedure Verification/Time Out Yes - 10:57 Taken: Start Time: 10:57 Pain Control: Lidocaine T Area Debrided (L x W): otal 1.5 (cm) x 1 (cm) = 1.5 (cm) Tissue and other material debrided: Viable, Non-Viable, Slough, Subcutaneous, Skin: Dermis , Skin: Epidermis, Slough Level: Skin/Subcutaneous Tissue Debridement Description: Excisional Instrument: Blade, Curette, Forceps Bleeding: Minimum Hemostasis Achieved: Pressure End Time: 10:58 Procedural Pain: 0 Post Procedural Pain: 0 Response to Treatment: Procedure was tolerated well Level of Consciousness (Post- Awake and Alert procedure): Post Debridement Measurements of Total Wound Length: (cm) 1.5 Width: (cm) 1 Depth: (cm) 0.3 Volume: (cm) 0.353 Character of Wound/Ulcer Post Debridement: Improved Severity of Tissue Post Debridement: Fat layer exposed Post Procedure Diagnosis Same as Pre-procedure Electronic Signature(s) Signed: 07/19/2020 5:10:40 PM By: [name] Signed: 07/21/2020 7:47:56 AM By: Linton Ham MD Entered By: Linton Ham on 07/19/2020 11:04:00 -------------------------------------------------------------------------------- Debridement Details Patient Name: Date of Service: [name] 07/19/2020 9:30 A M Medical Record Number: [id number] Patient  Account Number: [id number] Date of Birth/Sex: Treating RN: 03/25/1946 (75 y.o. [name], [name] Primary Care Provider: [name], [name] Other Clinician: Referring Provider: Treating Provider/Extender: Alexandr Oehler [name], [name] Weeks in Treatment: 222 Debridement Performed for Assessment: Wound #35 Left,Lateral,Dorsal Foot Performed By: Physician [name], MD Debridement Type: Debridement Severity of Tissue Pre Debridement: Fat layer exposed Level of Consciousness (Pre-procedure): Awake and Alert Pre-procedure Verification/Time Out Yes - 10:56 Taken: Start Time: 10:56 Pain Control: Lidocaine T Area Debrided (L x W): otal 1.4 (cm) x 1.5 (cm) =  [id number] Date of Birth/Sex: Treating RN: January 04, 1946 (75 y.o. [name], [name] Primary Care Provider: [name], [name] Other Clinician: Referring Provider: Treating Provider/Extender: Neilani Duffee [name], [name] Weeks in Treatment: 222 Active Problems ICD-10 Encounter Code Description Active Date MDM Diagnosis E11.621 Type 2 diabetes mellitus with foot ulcer 04/17/2016 No Yes L89.613 Pressure ulcer of right heel, stage 3 04/17/2016 No Yes L97.518 Non-pressure chronic ulcer of other part of right foot with other specified 05/17/2020 No Yes severity L97.221 Non-pressure chronic ulcer of left calf limited to breakdown of skin 08/15/2017 No Yes L97.321 Non-pressure chronic ulcer of left ankle limited to breakdown of skin 07/01/2018 No Yes L97.521 Non-pressure chronic ulcer of other part of left foot limited to breakdown of 02/17/2019 No Yes skin L97.524 Non-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 06/14/2020 No  Yes Inactive Problems ICD-10 Code Description Active Date Inactive Date L03.032 Cellulitis of left toe 01/06/2019 01/06/2019 [id number] Pressure ulcer of left heel, stage 3 12/04/2016 12/04/2016 I25.119 Atherosclerotic heart disease of native coronary artery with unspecified angina 04/17/2016 04/17/2016 pectoris L97.821 Non-pressure chronic ulcer of other part of left lower leg limited to breakdown of skin 01/06/2019 01/06/2019 S51.811D Laceration without foreign body of right forearm, subsequent encounter 10/22/2017 10/22/2017 [id number] Acute diastolic (congestive) heart failure 04/17/2016 04/17/2016 L03.116 Cellulitis of left lower limb 12/24/2017 12/24/2017 L89.620 Pressure ulcer of left heel, unstageable 07/21/2019 07/21/2019 L97.211 Non-pressure chronic ulcer of right calf limited to breakdown of skin 07/21/2019 07/21/2019 S80.211D Abrasion, right knee, subsequent encounter 08/18/2019 08/18/2019 Resolved Problems ICD-10 Code Description Active Date Resolved Date L89.512 Pressure ulcer of right ankle, stage 2 04/17/2016 04/17/2016 L89.522 Pressure ulcer of left ankle, stage 2 04/17/2016 04/17/2016 Electronic Signature(s) Signed: 07/21/2020 7:47:56 AM By: Linton Ham MD Entered By: Linton Ham on 07/19/2020 11:02:44 -------------------------------------------------------------------------------- Progress Note Details Patient Name: Date of Service: [name] 07/19/2020 9:30 A M Medical Record Number: [id number] Patient Account Number: [id number] Date of Birth/Sex: Treating RN: Feb 07, 1946 (75 y.o. [name], [name] Primary Care Provider: [name], [name] Other Clinician: Referring Provider: Treating Provider/Extender: Adean Milosevic [name], [name] Weeks in Treatment: 222 Subjective History of Present Illness (HPI) The following HPI elements were documented for the patient's wound: Location: On the left and right lateral forefoot which has been there for about 6 months Quality: Patient  reports No Pain. Severity: Patient states wound(s) are getting worse. Duration: Patient has had the wound for > 6 months prior to seeking treatment at the wound center Context: The wound would happen gradually Modifying Factors: Patient wound(s)/ulcer(s) are worsening due to :continual drainage from the wound Associated Signs and Symptoms: Patient reports having increase discharge. This patient returns after being seen here till the end of August and he was lost to follow-up. he has been quite debilitated laying in bed most of the time and his condition has deteriorated significantly. He has multiple ulcerations on the heel lateral forefoot and some of his toes. ======== Old notes: 75 year old male known to our practice when he was seen here in February and March and was lost to follow-up when he was admitted to hospital with various medical problems including coronary artery disease and a stroke. Now returns with the problem on the left forefoot where he has an ulceration and this has been there for about 6 months. most recently he was in hospital between July 6 and July 16, when he was admitted and treated for acute respiratory failure is secondary to aspiration pneumonia, large non-STEMI, ischemic cardiomyopathy  YENIEL, HARBOLD ([id number]) Visit Report for 07/19/2020 Debridement Details Patient Name: Date of Service: [name] [facility] F. 07/19/2020 9:30 A M Medical Record Number: [id number] Patient Account Number: [id number] Date of Birth/Sex: Treating RN: [birth year] (75 y.o. [name], [name] Primary Care Provider: [name], [name] Other Clinician: Referring Provider: Treating Provider/Extender: Anaisabel Pederson [name], [name] Weeks in Treatment: 222 Debridement Performed for Assessment: Wound #26 Left,Dorsal Foot Performed By: Physician [name], MD Debridement Type: Debridement Severity of Tissue Pre Debridement: Fat layer exposed Level of Consciousness (Pre-procedure): Awake and Alert Pre-procedure Verification/Time Out Yes - 10:55 Taken: Start Time: 10:55 Pain Control: Lidocaine T Area Debrided (L x W): otal 4 (cm) x 4 (cm) = 16 (cm) Tissue and other material debrided: Viable, Non-Viable, Slough, Subcutaneous, Tendon, Skin: Dermis , Skin: Epidermis, Slough Level: Skin/Subcutaneous Tissue/Muscle Debridement Description: Excisional Instrument: Blade, Curette, Forceps Specimen: Tissue Culture Number of Specimens T aken: 1 Bleeding: Minimum Hemostasis Achieved: Pressure End Time: 10:56 Procedural Pain: 0 Post Procedural Pain: 0 Response to Treatment: Procedure was tolerated well Level of Consciousness (Post- Awake and Alert procedure): Post Debridement Measurements of Total Wound Length: (cm) 4 Width: (cm) 4 Depth: (cm) 0.1 Volume: (cm) 1.257 Character of Wound/Ulcer Post Debridement: Improved Severity of Tissue Post Debridement: Fat layer exposed Post Procedure Diagnosis Same as Pre-procedure Electronic Signature(s) Signed: 07/19/2020 5:10:40 PM By: [name] Signed: 07/21/2020 7:47:56 AM By: Linton Ham MD Entered By: Linton Ham on 07/19/2020 11:03:42 -------------------------------------------------------------------------------- Debridement  Details Patient Name: Date of Service: [name] 07/19/2020 9:30 A M Medical Record Number: [id number] Patient Account Number: [id number] Date of Birth/Sex: Treating RN: 1945/06/23 (75 y.o. [name], [name] Primary Care Provider: [name], [name] Other Clinician: Referring Provider: Treating Provider/Extender: Aj Crunkleton [name], [name] Weeks in Treatment: 222 Debridement Performed for Assessment: Wound #32 Left,Distal,Dorsal Foot Performed By: Physician [name], MD Debridement Type: Debridement Severity of Tissue Pre Debridement: Fat layer exposed Level of Consciousness (Pre-procedure): Awake and Alert Pre-procedure Verification/Time Out Yes - 10:57 Taken: Start Time: 10:57 Pain Control: Lidocaine T Area Debrided (L x W): otal 1.5 (cm) x 1 (cm) = 1.5 (cm) Tissue and other material debrided: Viable, Non-Viable, Slough, Subcutaneous, Skin: Dermis , Skin: Epidermis, Slough Level: Skin/Subcutaneous Tissue Debridement Description: Excisional Instrument: Blade, Curette, Forceps Bleeding: Minimum Hemostasis Achieved: Pressure End Time: 10:58 Procedural Pain: 0 Post Procedural Pain: 0 Response to Treatment: Procedure was tolerated well Level of Consciousness (Post- Awake and Alert procedure): Post Debridement Measurements of Total Wound Length: (cm) 1.5 Width: (cm) 1 Depth: (cm) 0.3 Volume: (cm) 0.353 Character of Wound/Ulcer Post Debridement: Improved Severity of Tissue Post Debridement: Fat layer exposed Post Procedure Diagnosis Same as Pre-procedure Electronic Signature(s) Signed: 07/19/2020 5:10:40 PM By: [name] Signed: 07/21/2020 7:47:56 AM By: Linton Ham MD Entered By: Linton Ham on 07/19/2020 11:04:00 -------------------------------------------------------------------------------- Debridement Details Patient Name: Date of Service: [name] 07/19/2020 9:30 A M Medical Record Number: [id number] Patient  Account Number: [id number] Date of Birth/Sex: Treating RN: 03/25/1946 (75 y.o. [name], [name] Primary Care Provider: [name], [name] Other Clinician: Referring Provider: Treating Provider/Extender: Alexandr Oehler [name], [name] Weeks in Treatment: 222 Debridement Performed for Assessment: Wound #35 Left,Lateral,Dorsal Foot Performed By: Physician [name], MD Debridement Type: Debridement Severity of Tissue Pre Debridement: Fat layer exposed Level of Consciousness (Pre-procedure): Awake and Alert Pre-procedure Verification/Time Out Yes - 10:56 Taken: Start Time: 10:56 Pain Control: Lidocaine T Area Debrided (L x W): otal 1.4 (cm) x 1.5 (cm) =  YENIEL, HARBOLD ([id number]) Visit Report for 07/19/2020 Debridement Details Patient Name: Date of Service: [name] [facility] F. 07/19/2020 9:30 A M Medical Record Number: [id number] Patient Account Number: [id number] Date of Birth/Sex: Treating RN: [birth year] (75 y.o. [name], [name] Primary Care Provider: [name], [name] Other Clinician: Referring Provider: Treating Provider/Extender: Anaisabel Pederson [name], [name] Weeks in Treatment: 222 Debridement Performed for Assessment: Wound #26 Left,Dorsal Foot Performed By: Physician [name], MD Debridement Type: Debridement Severity of Tissue Pre Debridement: Fat layer exposed Level of Consciousness (Pre-procedure): Awake and Alert Pre-procedure Verification/Time Out Yes - 10:55 Taken: Start Time: 10:55 Pain Control: Lidocaine T Area Debrided (L x W): otal 4 (cm) x 4 (cm) = 16 (cm) Tissue and other material debrided: Viable, Non-Viable, Slough, Subcutaneous, Tendon, Skin: Dermis , Skin: Epidermis, Slough Level: Skin/Subcutaneous Tissue/Muscle Debridement Description: Excisional Instrument: Blade, Curette, Forceps Specimen: Tissue Culture Number of Specimens T aken: 1 Bleeding: Minimum Hemostasis Achieved: Pressure End Time: 10:56 Procedural Pain: 0 Post Procedural Pain: 0 Response to Treatment: Procedure was tolerated well Level of Consciousness (Post- Awake and Alert procedure): Post Debridement Measurements of Total Wound Length: (cm) 4 Width: (cm) 4 Depth: (cm) 0.1 Volume: (cm) 1.257 Character of Wound/Ulcer Post Debridement: Improved Severity of Tissue Post Debridement: Fat layer exposed Post Procedure Diagnosis Same as Pre-procedure Electronic Signature(s) Signed: 07/19/2020 5:10:40 PM By: [name] Signed: 07/21/2020 7:47:56 AM By: Linton Ham MD Entered By: Linton Ham on 07/19/2020 11:03:42 -------------------------------------------------------------------------------- Debridement  Details Patient Name: Date of Service: [name] 07/19/2020 9:30 A M Medical Record Number: [id number] Patient Account Number: [id number] Date of Birth/Sex: Treating RN: 1945/06/23 (75 y.o. [name], [name] Primary Care Provider: [name], [name] Other Clinician: Referring Provider: Treating Provider/Extender: Aj Crunkleton [name], [name] Weeks in Treatment: 222 Debridement Performed for Assessment: Wound #32 Left,Distal,Dorsal Foot Performed By: Physician [name], MD Debridement Type: Debridement Severity of Tissue Pre Debridement: Fat layer exposed Level of Consciousness (Pre-procedure): Awake and Alert Pre-procedure Verification/Time Out Yes - 10:57 Taken: Start Time: 10:57 Pain Control: Lidocaine T Area Debrided (L x W): otal 1.5 (cm) x 1 (cm) = 1.5 (cm) Tissue and other material debrided: Viable, Non-Viable, Slough, Subcutaneous, Skin: Dermis , Skin: Epidermis, Slough Level: Skin/Subcutaneous Tissue Debridement Description: Excisional Instrument: Blade, Curette, Forceps Bleeding: Minimum Hemostasis Achieved: Pressure End Time: 10:58 Procedural Pain: 0 Post Procedural Pain: 0 Response to Treatment: Procedure was tolerated well Level of Consciousness (Post- Awake and Alert procedure): Post Debridement Measurements of Total Wound Length: (cm) 1.5 Width: (cm) 1 Depth: (cm) 0.3 Volume: (cm) 0.353 Character of Wound/Ulcer Post Debridement: Improved Severity of Tissue Post Debridement: Fat layer exposed Post Procedure Diagnosis Same as Pre-procedure Electronic Signature(s) Signed: 07/19/2020 5:10:40 PM By: [name] Signed: 07/21/2020 7:47:56 AM By: Linton Ham MD Entered By: Linton Ham on 07/19/2020 11:04:00 -------------------------------------------------------------------------------- Debridement Details Patient Name: Date of Service: [name] 07/19/2020 9:30 A M Medical Record Number: [id number] Patient  Account Number: [id number] Date of Birth/Sex: Treating RN: 03/25/1946 (75 y.o. [name], [name] Primary Care Provider: [name], [name] Other Clinician: Referring Provider: Treating Provider/Extender: Alexandr Oehler [name], [name] Weeks in Treatment: 222 Debridement Performed for Assessment: Wound #35 Left,Lateral,Dorsal Foot Performed By: Physician [name], MD Debridement Type: Debridement Severity of Tissue Pre Debridement: Fat layer exposed Level of Consciousness (Pre-procedure): Awake and Alert Pre-procedure Verification/Time Out Yes - 10:56 Taken: Start Time: 10:56 Pain Control: Lidocaine T Area Debrided (L x W): otal 1.4 (cm) x 1.5 (cm) =  Date of Service: [name] 07/19/2020 9:30 A M Medical Record Number: [id number] Patient Account Number: [id number] Date of Birth/Sex: Treating RN: 06/05/1945 (75 y.o. [name], [name] Primary Care Provider: [name], [name] Other Clinician: Referring Provider: Treating Provider/Extender: Elonda Giuliano [name], [name] Weeks in Treatment: 222 Verbal / Phone Orders: No Diagnosis Coding ICD-10 Coding Code Description E11.621 Type 2 diabetes mellitus with foot ulcer L89.613 Pressure ulcer of right heel, stage 3 L97.518 Non-pressure chronic ulcer of other part of right foot with other specified severity L97.221 Non-pressure chronic ulcer of left calf limited to breakdown of skin L97.321 Non-pressure chronic ulcer of left ankle limited to breakdown of skin L97.521 Non-pressure chronic ulcer of other part of left foot limited to breakdown of skin L97.524 Non-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 Follow-up Appointments Return Appointment in 2 weeks. Bathing/ Shower/ Hygiene May shower with protection but do not get wound dressing(s) wet. Edema Control - Lymphedema / SCD / Other Elevate legs to the level of the heart or above for 30 minutes daily and/or when sitting, a frequency of: Avoid standing for long periods of time. Off-Loading Other: - multipodus boots to both feet Wound Treatment Wound #14 - Lower Leg Wound Laterality: Left, Lateral Cleanser: Wound Cleanser (Generic) 1 x Per Week/15 Days Discharge Instructions: Cleanse the wound with wound cleanser prior to  applying a clean dressing using gauze sponges, not tissue or cotton balls. Cleanser: Soap and Water 1 x Per Week/15 Days Discharge Instructions: May shower and wash wound with dial antibacterial soap and water prior to dressing change. Peri-Wound Care: Triamcinolone 15 (g) 1 x Per Week/15 Days Discharge Instructions: Use triamcinolone 15 (g) as directed Peri-Wound Care: Sween Lotion (Moisturizing lotion) 1 x Per Week/15 Days Discharge Instructions: Apply moisturizing lotion as directed Prim Dressing: KerraCel Ag Gelling Fiber Dressing, 4x5 in (silver alginate) (Generic) 1 x Per Week/15 Days ary Discharge Instructions: Apply silver alginate to wound bed as instructed Secondary Dressing: Woven Gauze Sponge, Non-Sterile 4x4 in (Generic) 1 x Per Week/15 Days Discharge Instructions: Apply over primary dressing as directed. Secondary Dressing: ABD Pad, 5x9 1 x Per Week/15 Days Discharge Instructions: Apply over primary dressing as directed. Compression Wrap: ThreePress (3 layer compression wrap) 1 x Per Week/15 Days Discharge Instructions: Apply three layer compression as directed. Wound #25 - Lower Leg Wound Laterality: Right, Lateral Cleanser: Wound Cleanser (Generic) 1 x Per Week/15 Days Discharge Instructions: Cleanse the wound with wound cleanser prior to applying a clean dressing using gauze sponges, not tissue or cotton balls. Cleanser: Soap and Water 1 x Per Week/15 Days Discharge Instructions: May shower and wash wound with dial antibacterial soap and water prior to dressing change. Peri-Wound Care: Triamcinolone 15 (g) 1 x Per Week/15 Days Discharge Instructions: Use triamcinolone 15 (g) as directed Peri-Wound Care: Sween Lotion (Moisturizing lotion) 1 x Per Week/15 Days Discharge Instructions: Apply moisturizing lotion as directed Prim Dressing: KerraCel Ag Gelling Fiber Dressing, 4x5 in (silver alginate) (Generic) 1 x Per Week/15 Days ary Discharge Instructions: Apply silver alginate  to wound bed as instructed Secondary Dressing: Woven Gauze Sponge, Non-Sterile 4x4 in (Generic) 1 x Per Week/15 Days Discharge Instructions: Apply over primary dressing as directed. Secondary Dressing: ABD Pad, 5x9 1 x Per Week/15 Days Discharge Instructions: Apply over primary dressing as directed. Compression Wrap: ThreePress (3 layer compression wrap) 1 x Per Week/15 Days Discharge Instructions: Apply three layer compression as directed. Wound #26 - Foot Wound Laterality: Dorsal, Left  Date of Service: [name] 07/19/2020 9:30 A M Medical Record Number: [id number] Patient Account Number: [id number] Date of Birth/Sex: Treating RN: 06/05/1945 (75 y.o. [name], [name] Primary Care Provider: [name], [name] Other Clinician: Referring Provider: Treating Provider/Extender: Elonda Giuliano [name], [name] Weeks in Treatment: 222 Verbal / Phone Orders: No Diagnosis Coding ICD-10 Coding Code Description E11.621 Type 2 diabetes mellitus with foot ulcer L89.613 Pressure ulcer of right heel, stage 3 L97.518 Non-pressure chronic ulcer of other part of right foot with other specified severity L97.221 Non-pressure chronic ulcer of left calf limited to breakdown of skin L97.321 Non-pressure chronic ulcer of left ankle limited to breakdown of skin L97.521 Non-pressure chronic ulcer of other part of left foot limited to breakdown of skin L97.524 Non-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 Follow-up Appointments Return Appointment in 2 weeks. Bathing/ Shower/ Hygiene May shower with protection but do not get wound dressing(s) wet. Edema Control - Lymphedema / SCD / Other Elevate legs to the level of the heart or above for 30 minutes daily and/or when sitting, a frequency of: Avoid standing for long periods of time. Off-Loading Other: - multipodus boots to both feet Wound Treatment Wound #14 - Lower Leg Wound Laterality: Left, Lateral Cleanser: Wound Cleanser (Generic) 1 x Per Week/15 Days Discharge Instructions: Cleanse the wound with wound cleanser prior to  applying a clean dressing using gauze sponges, not tissue or cotton balls. Cleanser: Soap and Water 1 x Per Week/15 Days Discharge Instructions: May shower and wash wound with dial antibacterial soap and water prior to dressing change. Peri-Wound Care: Triamcinolone 15 (g) 1 x Per Week/15 Days Discharge Instructions: Use triamcinolone 15 (g) as directed Peri-Wound Care: Sween Lotion (Moisturizing lotion) 1 x Per Week/15 Days Discharge Instructions: Apply moisturizing lotion as directed Prim Dressing: KerraCel Ag Gelling Fiber Dressing, 4x5 in (silver alginate) (Generic) 1 x Per Week/15 Days ary Discharge Instructions: Apply silver alginate to wound bed as instructed Secondary Dressing: Woven Gauze Sponge, Non-Sterile 4x4 in (Generic) 1 x Per Week/15 Days Discharge Instructions: Apply over primary dressing as directed. Secondary Dressing: ABD Pad, 5x9 1 x Per Week/15 Days Discharge Instructions: Apply over primary dressing as directed. Compression Wrap: ThreePress (3 layer compression wrap) 1 x Per Week/15 Days Discharge Instructions: Apply three layer compression as directed. Wound #25 - Lower Leg Wound Laterality: Right, Lateral Cleanser: Wound Cleanser (Generic) 1 x Per Week/15 Days Discharge Instructions: Cleanse the wound with wound cleanser prior to applying a clean dressing using gauze sponges, not tissue or cotton balls. Cleanser: Soap and Water 1 x Per Week/15 Days Discharge Instructions: May shower and wash wound with dial antibacterial soap and water prior to dressing change. Peri-Wound Care: Triamcinolone 15 (g) 1 x Per Week/15 Days Discharge Instructions: Use triamcinolone 15 (g) as directed Peri-Wound Care: Sween Lotion (Moisturizing lotion) 1 x Per Week/15 Days Discharge Instructions: Apply moisturizing lotion as directed Prim Dressing: KerraCel Ag Gelling Fiber Dressing, 4x5 in (silver alginate) (Generic) 1 x Per Week/15 Days ary Discharge Instructions: Apply silver alginate  to wound bed as instructed Secondary Dressing: Woven Gauze Sponge, Non-Sterile 4x4 in (Generic) 1 x Per Week/15 Days Discharge Instructions: Apply over primary dressing as directed. Secondary Dressing: ABD Pad, 5x9 1 x Per Week/15 Days Discharge Instructions: Apply over primary dressing as directed. Compression Wrap: ThreePress (3 layer compression wrap) 1 x Per Week/15 Days Discharge Instructions: Apply three layer compression as directed. Wound #26 - Foot Wound Laterality: Dorsal, Left  [id number] Date of Birth/Sex: Treating RN: January 04, 1946 (75 y.o. [name], [name] Primary Care Provider: [name], [name] Other Clinician: Referring Provider: Treating Provider/Extender: Neilani Duffee [name], [name] Weeks in Treatment: 222 Active Problems ICD-10 Encounter Code Description Active Date MDM Diagnosis E11.621 Type 2 diabetes mellitus with foot ulcer 04/17/2016 No Yes L89.613 Pressure ulcer of right heel, stage 3 04/17/2016 No Yes L97.518 Non-pressure chronic ulcer of other part of right foot with other specified 05/17/2020 No Yes severity L97.221 Non-pressure chronic ulcer of left calf limited to breakdown of skin 08/15/2017 No Yes L97.321 Non-pressure chronic ulcer of left ankle limited to breakdown of skin 07/01/2018 No Yes L97.521 Non-pressure chronic ulcer of other part of left foot limited to breakdown of 02/17/2019 No Yes skin L97.524 Non-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 06/14/2020 No  Yes Inactive Problems ICD-10 Code Description Active Date Inactive Date L03.032 Cellulitis of left toe 01/06/2019 01/06/2019 [id number] Pressure ulcer of left heel, stage 3 12/04/2016 12/04/2016 I25.119 Atherosclerotic heart disease of native coronary artery with unspecified angina 04/17/2016 04/17/2016 pectoris L97.821 Non-pressure chronic ulcer of other part of left lower leg limited to breakdown of skin 01/06/2019 01/06/2019 S51.811D Laceration without foreign body of right forearm, subsequent encounter 10/22/2017 10/22/2017 [id number] Acute diastolic (congestive) heart failure 04/17/2016 04/17/2016 L03.116 Cellulitis of left lower limb 12/24/2017 12/24/2017 L89.620 Pressure ulcer of left heel, unstageable 07/21/2019 07/21/2019 L97.211 Non-pressure chronic ulcer of right calf limited to breakdown of skin 07/21/2019 07/21/2019 S80.211D Abrasion, right knee, subsequent encounter 08/18/2019 08/18/2019 Resolved Problems ICD-10 Code Description Active Date Resolved Date L89.512 Pressure ulcer of right ankle, stage 2 04/17/2016 04/17/2016 L89.522 Pressure ulcer of left ankle, stage 2 04/17/2016 04/17/2016 Electronic Signature(s) Signed: 07/21/2020 7:47:56 AM By: Linton Ham MD Entered By: Linton Ham on 07/19/2020 11:02:44 -------------------------------------------------------------------------------- Progress Note Details Patient Name: Date of Service: [name] 07/19/2020 9:30 A M Medical Record Number: [id number] Patient Account Number: [id number] Date of Birth/Sex: Treating RN: Feb 07, 1946 (75 y.o. [name], [name] Primary Care Provider: [name], [name] Other Clinician: Referring Provider: Treating Provider/Extender: Adean Milosevic [name], [name] Weeks in Treatment: 222 Subjective History of Present Illness (HPI) The following HPI elements were documented for the patient's wound: Location: On the left and right lateral forefoot which has been there for about 6 months Quality: Patient  reports No Pain. Severity: Patient states wound(s) are getting worse. Duration: Patient has had the wound for > 6 months prior to seeking treatment at the wound center Context: The wound would happen gradually Modifying Factors: Patient wound(s)/ulcer(s) are worsening due to :continual drainage from the wound Associated Signs and Symptoms: Patient reports having increase discharge. This patient returns after being seen here till the end of August and he was lost to follow-up. he has been quite debilitated laying in bed most of the time and his condition has deteriorated significantly. He has multiple ulcerations on the heel lateral forefoot and some of his toes. ======== Old notes: 75 year old male known to our practice when he was seen here in February and March and was lost to follow-up when he was admitted to hospital with various medical problems including coronary artery disease and a stroke. Now returns with the problem on the left forefoot where he has an ulceration and this has been there for about 6 months. most recently he was in hospital between July 6 and July 16, when he was admitted and treated for acute respiratory failure is secondary to aspiration pneumonia, large non-STEMI, ischemic cardiomyopathy  Date of Service: [name] 07/19/2020 9:30 A M Medical Record Number: [id number] Patient Account Number: [id number] Date of Birth/Sex: Treating RN: 06/05/1945 (75 y.o. [name], [name] Primary Care Provider: [name], [name] Other Clinician: Referring Provider: Treating Provider/Extender: Elonda Giuliano [name], [name] Weeks in Treatment: 222 Verbal / Phone Orders: No Diagnosis Coding ICD-10 Coding Code Description E11.621 Type 2 diabetes mellitus with foot ulcer L89.613 Pressure ulcer of right heel, stage 3 L97.518 Non-pressure chronic ulcer of other part of right foot with other specified severity L97.221 Non-pressure chronic ulcer of left calf limited to breakdown of skin L97.321 Non-pressure chronic ulcer of left ankle limited to breakdown of skin L97.521 Non-pressure chronic ulcer of other part of left foot limited to breakdown of skin L97.524 Non-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 Follow-up Appointments Return Appointment in 2 weeks. Bathing/ Shower/ Hygiene May shower with protection but do not get wound dressing(s) wet. Edema Control - Lymphedema / SCD / Other Elevate legs to the level of the heart or above for 30 minutes daily and/or when sitting, a frequency of: Avoid standing for long periods of time. Off-Loading Other: - multipodus boots to both feet Wound Treatment Wound #14 - Lower Leg Wound Laterality: Left, Lateral Cleanser: Wound Cleanser (Generic) 1 x Per Week/15 Days Discharge Instructions: Cleanse the wound with wound cleanser prior to  applying a clean dressing using gauze sponges, not tissue or cotton balls. Cleanser: Soap and Water 1 x Per Week/15 Days Discharge Instructions: May shower and wash wound with dial antibacterial soap and water prior to dressing change. Peri-Wound Care: Triamcinolone 15 (g) 1 x Per Week/15 Days Discharge Instructions: Use triamcinolone 15 (g) as directed Peri-Wound Care: Sween Lotion (Moisturizing lotion) 1 x Per Week/15 Days Discharge Instructions: Apply moisturizing lotion as directed Prim Dressing: KerraCel Ag Gelling Fiber Dressing, 4x5 in (silver alginate) (Generic) 1 x Per Week/15 Days ary Discharge Instructions: Apply silver alginate to wound bed as instructed Secondary Dressing: Woven Gauze Sponge, Non-Sterile 4x4 in (Generic) 1 x Per Week/15 Days Discharge Instructions: Apply over primary dressing as directed. Secondary Dressing: ABD Pad, 5x9 1 x Per Week/15 Days Discharge Instructions: Apply over primary dressing as directed. Compression Wrap: ThreePress (3 layer compression wrap) 1 x Per Week/15 Days Discharge Instructions: Apply three layer compression as directed. Wound #25 - Lower Leg Wound Laterality: Right, Lateral Cleanser: Wound Cleanser (Generic) 1 x Per Week/15 Days Discharge Instructions: Cleanse the wound with wound cleanser prior to applying a clean dressing using gauze sponges, not tissue or cotton balls. Cleanser: Soap and Water 1 x Per Week/15 Days Discharge Instructions: May shower and wash wound with dial antibacterial soap and water prior to dressing change. Peri-Wound Care: Triamcinolone 15 (g) 1 x Per Week/15 Days Discharge Instructions: Use triamcinolone 15 (g) as directed Peri-Wound Care: Sween Lotion (Moisturizing lotion) 1 x Per Week/15 Days Discharge Instructions: Apply moisturizing lotion as directed Prim Dressing: KerraCel Ag Gelling Fiber Dressing, 4x5 in (silver alginate) (Generic) 1 x Per Week/15 Days ary Discharge Instructions: Apply silver alginate  to wound bed as instructed Secondary Dressing: Woven Gauze Sponge, Non-Sterile 4x4 in (Generic) 1 x Per Week/15 Days Discharge Instructions: Apply over primary dressing as directed. Secondary Dressing: ABD Pad, 5x9 1 x Per Week/15 Days Discharge Instructions: Apply over primary dressing as directed. Compression Wrap: ThreePress (3 layer compression wrap) 1 x Per Week/15 Days Discharge Instructions: Apply three layer compression as directed. Wound #26 - Foot Wound Laterality: Dorsal, Left  YENIEL, HARBOLD ([id number]) Visit Report for 07/19/2020 Debridement Details Patient Name: Date of Service: [name] [facility] F. 07/19/2020 9:30 A M Medical Record Number: [id number] Patient Account Number: [id number] Date of Birth/Sex: Treating RN: [birth year] (75 y.o. [name], [name] Primary Care Provider: [name], [name] Other Clinician: Referring Provider: Treating Provider/Extender: Anaisabel Pederson [name], [name] Weeks in Treatment: 222 Debridement Performed for Assessment: Wound #26 Left,Dorsal Foot Performed By: Physician [name], MD Debridement Type: Debridement Severity of Tissue Pre Debridement: Fat layer exposed Level of Consciousness (Pre-procedure): Awake and Alert Pre-procedure Verification/Time Out Yes - 10:55 Taken: Start Time: 10:55 Pain Control: Lidocaine T Area Debrided (L x W): otal 4 (cm) x 4 (cm) = 16 (cm) Tissue and other material debrided: Viable, Non-Viable, Slough, Subcutaneous, Tendon, Skin: Dermis , Skin: Epidermis, Slough Level: Skin/Subcutaneous Tissue/Muscle Debridement Description: Excisional Instrument: Blade, Curette, Forceps Specimen: Tissue Culture Number of Specimens T aken: 1 Bleeding: Minimum Hemostasis Achieved: Pressure End Time: 10:56 Procedural Pain: 0 Post Procedural Pain: 0 Response to Treatment: Procedure was tolerated well Level of Consciousness (Post- Awake and Alert procedure): Post Debridement Measurements of Total Wound Length: (cm) 4 Width: (cm) 4 Depth: (cm) 0.1 Volume: (cm) 1.257 Character of Wound/Ulcer Post Debridement: Improved Severity of Tissue Post Debridement: Fat layer exposed Post Procedure Diagnosis Same as Pre-procedure Electronic Signature(s) Signed: 07/19/2020 5:10:40 PM By: [name] Signed: 07/21/2020 7:47:56 AM By: Linton Ham MD Entered By: Linton Ham on 07/19/2020 11:03:42 -------------------------------------------------------------------------------- Debridement  Details Patient Name: Date of Service: [name] 07/19/2020 9:30 A M Medical Record Number: [id number] Patient Account Number: [id number] Date of Birth/Sex: Treating RN: 1945/06/23 (75 y.o. [name], [name] Primary Care Provider: [name], [name] Other Clinician: Referring Provider: Treating Provider/Extender: Aj Crunkleton [name], [name] Weeks in Treatment: 222 Debridement Performed for Assessment: Wound #32 Left,Distal,Dorsal Foot Performed By: Physician [name], MD Debridement Type: Debridement Severity of Tissue Pre Debridement: Fat layer exposed Level of Consciousness (Pre-procedure): Awake and Alert Pre-procedure Verification/Time Out Yes - 10:57 Taken: Start Time: 10:57 Pain Control: Lidocaine T Area Debrided (L x W): otal 1.5 (cm) x 1 (cm) = 1.5 (cm) Tissue and other material debrided: Viable, Non-Viable, Slough, Subcutaneous, Skin: Dermis , Skin: Epidermis, Slough Level: Skin/Subcutaneous Tissue Debridement Description: Excisional Instrument: Blade, Curette, Forceps Bleeding: Minimum Hemostasis Achieved: Pressure End Time: 10:58 Procedural Pain: 0 Post Procedural Pain: 0 Response to Treatment: Procedure was tolerated well Level of Consciousness (Post- Awake and Alert procedure): Post Debridement Measurements of Total Wound Length: (cm) 1.5 Width: (cm) 1 Depth: (cm) 0.3 Volume: (cm) 0.353 Character of Wound/Ulcer Post Debridement: Improved Severity of Tissue Post Debridement: Fat layer exposed Post Procedure Diagnosis Same as Pre-procedure Electronic Signature(s) Signed: 07/19/2020 5:10:40 PM By: [name] Signed: 07/21/2020 7:47:56 AM By: Linton Ham MD Entered By: Linton Ham on 07/19/2020 11:04:00 -------------------------------------------------------------------------------- Debridement Details Patient Name: Date of Service: [name] 07/19/2020 9:30 A M Medical Record Number: [id number] Patient  Account Number: [id number] Date of Birth/Sex: Treating RN: 03/25/1946 (75 y.o. [name], [name] Primary Care Provider: [name], [name] Other Clinician: Referring Provider: Treating Provider/Extender: Alexandr Oehler [name], [name] Weeks in Treatment: 222 Debridement Performed for Assessment: Wound #35 Left,Lateral,Dorsal Foot Performed By: Physician [name], MD Debridement Type: Debridement Severity of Tissue Pre Debridement: Fat layer exposed Level of Consciousness (Pre-procedure): Awake and Alert Pre-procedure Verification/Time Out Yes - 10:56 Taken: Start Time: 10:56 Pain Control: Lidocaine T Area Debrided (L x W): otal 1.4 (cm) x 1.5 (cm) =  Date of Service: [name] 07/19/2020 9:30 A M Medical Record Number: [id number] Patient Account Number: [id number] Date of Birth/Sex: Treating RN: 06/05/1945 (75 y.o. [name], [name] Primary Care Provider: [name], [name] Other Clinician: Referring Provider: Treating Provider/Extender: Elonda Giuliano [name], [name] Weeks in Treatment: 222 Verbal / Phone Orders: No Diagnosis Coding ICD-10 Coding Code Description E11.621 Type 2 diabetes mellitus with foot ulcer L89.613 Pressure ulcer of right heel, stage 3 L97.518 Non-pressure chronic ulcer of other part of right foot with other specified severity L97.221 Non-pressure chronic ulcer of left calf limited to breakdown of skin L97.321 Non-pressure chronic ulcer of left ankle limited to breakdown of skin L97.521 Non-pressure chronic ulcer of other part of left foot limited to breakdown of skin L97.524 Non-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 Follow-up Appointments Return Appointment in 2 weeks. Bathing/ Shower/ Hygiene May shower with protection but do not get wound dressing(s) wet. Edema Control - Lymphedema / SCD / Other Elevate legs to the level of the heart or above for 30 minutes daily and/or when sitting, a frequency of: Avoid standing for long periods of time. Off-Loading Other: - multipodus boots to both feet Wound Treatment Wound #14 - Lower Leg Wound Laterality: Left, Lateral Cleanser: Wound Cleanser (Generic) 1 x Per Week/15 Days Discharge Instructions: Cleanse the wound with wound cleanser prior to  applying a clean dressing using gauze sponges, not tissue or cotton balls. Cleanser: Soap and Water 1 x Per Week/15 Days Discharge Instructions: May shower and wash wound with dial antibacterial soap and water prior to dressing change. Peri-Wound Care: Triamcinolone 15 (g) 1 x Per Week/15 Days Discharge Instructions: Use triamcinolone 15 (g) as directed Peri-Wound Care: Sween Lotion (Moisturizing lotion) 1 x Per Week/15 Days Discharge Instructions: Apply moisturizing lotion as directed Prim Dressing: KerraCel Ag Gelling Fiber Dressing, 4x5 in (silver alginate) (Generic) 1 x Per Week/15 Days ary Discharge Instructions: Apply silver alginate to wound bed as instructed Secondary Dressing: Woven Gauze Sponge, Non-Sterile 4x4 in (Generic) 1 x Per Week/15 Days Discharge Instructions: Apply over primary dressing as directed. Secondary Dressing: ABD Pad, 5x9 1 x Per Week/15 Days Discharge Instructions: Apply over primary dressing as directed. Compression Wrap: ThreePress (3 layer compression wrap) 1 x Per Week/15 Days Discharge Instructions: Apply three layer compression as directed. Wound #25 - Lower Leg Wound Laterality: Right, Lateral Cleanser: Wound Cleanser (Generic) 1 x Per Week/15 Days Discharge Instructions: Cleanse the wound with wound cleanser prior to applying a clean dressing using gauze sponges, not tissue or cotton balls. Cleanser: Soap and Water 1 x Per Week/15 Days Discharge Instructions: May shower and wash wound with dial antibacterial soap and water prior to dressing change. Peri-Wound Care: Triamcinolone 15 (g) 1 x Per Week/15 Days Discharge Instructions: Use triamcinolone 15 (g) as directed Peri-Wound Care: Sween Lotion (Moisturizing lotion) 1 x Per Week/15 Days Discharge Instructions: Apply moisturizing lotion as directed Prim Dressing: KerraCel Ag Gelling Fiber Dressing, 4x5 in (silver alginate) (Generic) 1 x Per Week/15 Days ary Discharge Instructions: Apply silver alginate  to wound bed as instructed Secondary Dressing: Woven Gauze Sponge, Non-Sterile 4x4 in (Generic) 1 x Per Week/15 Days Discharge Instructions: Apply over primary dressing as directed. Secondary Dressing: ABD Pad, 5x9 1 x Per Week/15 Days Discharge Instructions: Apply over primary dressing as directed. Compression Wrap: ThreePress (3 layer compression wrap) 1 x Per Week/15 Days Discharge Instructions: Apply three layer compression as directed. Wound #26 - Foot Wound Laterality: Dorsal, Left  [id number] Date of Birth/Sex: Treating RN: January 04, 1946 (75 y.o. [name], [name] Primary Care Provider: [name], [name] Other Clinician: Referring Provider: Treating Provider/Extender: Neilani Duffee [name], [name] Weeks in Treatment: 222 Active Problems ICD-10 Encounter Code Description Active Date MDM Diagnosis E11.621 Type 2 diabetes mellitus with foot ulcer 04/17/2016 No Yes L89.613 Pressure ulcer of right heel, stage 3 04/17/2016 No Yes L97.518 Non-pressure chronic ulcer of other part of right foot with other specified 05/17/2020 No Yes severity L97.221 Non-pressure chronic ulcer of left calf limited to breakdown of skin 08/15/2017 No Yes L97.321 Non-pressure chronic ulcer of left ankle limited to breakdown of skin 07/01/2018 No Yes L97.521 Non-pressure chronic ulcer of other part of left foot limited to breakdown of 02/17/2019 No Yes skin L97.524 Non-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 06/14/2020 No  Yes Inactive Problems ICD-10 Code Description Active Date Inactive Date L03.032 Cellulitis of left toe 01/06/2019 01/06/2019 [id number] Pressure ulcer of left heel, stage 3 12/04/2016 12/04/2016 I25.119 Atherosclerotic heart disease of native coronary artery with unspecified angina 04/17/2016 04/17/2016 pectoris L97.821 Non-pressure chronic ulcer of other part of left lower leg limited to breakdown of skin 01/06/2019 01/06/2019 S51.811D Laceration without foreign body of right forearm, subsequent encounter 10/22/2017 10/22/2017 [id number] Acute diastolic (congestive) heart failure 04/17/2016 04/17/2016 L03.116 Cellulitis of left lower limb 12/24/2017 12/24/2017 L89.620 Pressure ulcer of left heel, unstageable 07/21/2019 07/21/2019 L97.211 Non-pressure chronic ulcer of right calf limited to breakdown of skin 07/21/2019 07/21/2019 S80.211D Abrasion, right knee, subsequent encounter 08/18/2019 08/18/2019 Resolved Problems ICD-10 Code Description Active Date Resolved Date L89.512 Pressure ulcer of right ankle, stage 2 04/17/2016 04/17/2016 L89.522 Pressure ulcer of left ankle, stage 2 04/17/2016 04/17/2016 Electronic Signature(s) Signed: 07/21/2020 7:47:56 AM By: Linton Ham MD Entered By: Linton Ham on 07/19/2020 11:02:44 -------------------------------------------------------------------------------- Progress Note Details Patient Name: Date of Service: [name] 07/19/2020 9:30 A M Medical Record Number: [id number] Patient Account Number: [id number] Date of Birth/Sex: Treating RN: Feb 07, 1946 (75 y.o. [name], [name] Primary Care Provider: [name], [name] Other Clinician: Referring Provider: Treating Provider/Extender: Adean Milosevic [name], [name] Weeks in Treatment: 222 Subjective History of Present Illness (HPI) The following HPI elements were documented for the patient's wound: Location: On the left and right lateral forefoot which has been there for about 6 months Quality: Patient  reports No Pain. Severity: Patient states wound(s) are getting worse. Duration: Patient has had the wound for > 6 months prior to seeking treatment at the wound center Context: The wound would happen gradually Modifying Factors: Patient wound(s)/ulcer(s) are worsening due to :continual drainage from the wound Associated Signs and Symptoms: Patient reports having increase discharge. This patient returns after being seen here till the end of August and he was lost to follow-up. he has been quite debilitated laying in bed most of the time and his condition has deteriorated significantly. He has multiple ulcerations on the heel lateral forefoot and some of his toes. ======== Old notes: 75 year old male known to our practice when he was seen here in February and March and was lost to follow-up when he was admitted to hospital with various medical problems including coronary artery disease and a stroke. Now returns with the problem on the left forefoot where he has an ulceration and this has been there for about 6 months. most recently he was in hospital between July 6 and July 16, when he was admitted and treated for acute respiratory failure is secondary to aspiration pneumonia, large non-STEMI, ischemic cardiomyopathy  [id number] Date of Birth/Sex: Treating RN: January 04, 1946 (75 y.o. [name], [name] Primary Care Provider: [name], [name] Other Clinician: Referring Provider: Treating Provider/Extender: Neilani Duffee [name], [name] Weeks in Treatment: 222 Active Problems ICD-10 Encounter Code Description Active Date MDM Diagnosis E11.621 Type 2 diabetes mellitus with foot ulcer 04/17/2016 No Yes L89.613 Pressure ulcer of right heel, stage 3 04/17/2016 No Yes L97.518 Non-pressure chronic ulcer of other part of right foot with other specified 05/17/2020 No Yes severity L97.221 Non-pressure chronic ulcer of left calf limited to breakdown of skin 08/15/2017 No Yes L97.321 Non-pressure chronic ulcer of left ankle limited to breakdown of skin 07/01/2018 No Yes L97.521 Non-pressure chronic ulcer of other part of left foot limited to breakdown of 02/17/2019 No Yes skin L97.524 Non-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 06/14/2020 No  Yes Inactive Problems ICD-10 Code Description Active Date Inactive Date L03.032 Cellulitis of left toe 01/06/2019 01/06/2019 [id number] Pressure ulcer of left heel, stage 3 12/04/2016 12/04/2016 I25.119 Atherosclerotic heart disease of native coronary artery with unspecified angina 04/17/2016 04/17/2016 pectoris L97.821 Non-pressure chronic ulcer of other part of left lower leg limited to breakdown of skin 01/06/2019 01/06/2019 S51.811D Laceration without foreign body of right forearm, subsequent encounter 10/22/2017 10/22/2017 [id number] Acute diastolic (congestive) heart failure 04/17/2016 04/17/2016 L03.116 Cellulitis of left lower limb 12/24/2017 12/24/2017 L89.620 Pressure ulcer of left heel, unstageable 07/21/2019 07/21/2019 L97.211 Non-pressure chronic ulcer of right calf limited to breakdown of skin 07/21/2019 07/21/2019 S80.211D Abrasion, right knee, subsequent encounter 08/18/2019 08/18/2019 Resolved Problems ICD-10 Code Description Active Date Resolved Date L89.512 Pressure ulcer of right ankle, stage 2 04/17/2016 04/17/2016 L89.522 Pressure ulcer of left ankle, stage 2 04/17/2016 04/17/2016 Electronic Signature(s) Signed: 07/21/2020 7:47:56 AM By: Linton Ham MD Entered By: Linton Ham on 07/19/2020 11:02:44 -------------------------------------------------------------------------------- Progress Note Details Patient Name: Date of Service: [name] 07/19/2020 9:30 A M Medical Record Number: [id number] Patient Account Number: [id number] Date of Birth/Sex: Treating RN: Feb 07, 1946 (75 y.o. [name], [name] Primary Care Provider: [name], [name] Other Clinician: Referring Provider: Treating Provider/Extender: Adean Milosevic [name], [name] Weeks in Treatment: 222 Subjective History of Present Illness (HPI) The following HPI elements were documented for the patient's wound: Location: On the left and right lateral forefoot which has been there for about 6 months Quality: Patient  reports No Pain. Severity: Patient states wound(s) are getting worse. Duration: Patient has had the wound for > 6 months prior to seeking treatment at the wound center Context: The wound would happen gradually Modifying Factors: Patient wound(s)/ulcer(s) are worsening due to :continual drainage from the wound Associated Signs and Symptoms: Patient reports having increase discharge. This patient returns after being seen here till the end of August and he was lost to follow-up. he has been quite debilitated laying in bed most of the time and his condition has deteriorated significantly. He has multiple ulcerations on the heel lateral forefoot and some of his toes. ======== Old notes: 75 year old male known to our practice when he was seen here in February and March and was lost to follow-up when he was admitted to hospital with various medical problems including coronary artery disease and a stroke. Now returns with the problem on the left forefoot where he has an ulceration and this has been there for about 6 months. most recently he was in hospital between July 6 and July 16, when he was admitted and treated for acute respiratory failure is secondary to aspiration pneumonia, large non-STEMI, ischemic cardiomyopathy  Date of Service: [name] 07/19/2020 9:30 A M Medical Record Number: [id number] Patient Account Number: [id number] Date of Birth/Sex: Treating RN: 06/05/1945 (75 y.o. [name], [name] Primary Care Provider: [name], [name] Other Clinician: Referring Provider: Treating Provider/Extender: Elonda Giuliano [name], [name] Weeks in Treatment: 222 Verbal / Phone Orders: No Diagnosis Coding ICD-10 Coding Code Description E11.621 Type 2 diabetes mellitus with foot ulcer L89.613 Pressure ulcer of right heel, stage 3 L97.518 Non-pressure chronic ulcer of other part of right foot with other specified severity L97.221 Non-pressure chronic ulcer of left calf limited to breakdown of skin L97.321 Non-pressure chronic ulcer of left ankle limited to breakdown of skin L97.521 Non-pressure chronic ulcer of other part of left foot limited to breakdown of skin L97.524 Non-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 Follow-up Appointments Return Appointment in 2 weeks. Bathing/ Shower/ Hygiene May shower with protection but do not get wound dressing(s) wet. Edema Control - Lymphedema / SCD / Other Elevate legs to the level of the heart or above for 30 minutes daily and/or when sitting, a frequency of: Avoid standing for long periods of time. Off-Loading Other: - multipodus boots to both feet Wound Treatment Wound #14 - Lower Leg Wound Laterality: Left, Lateral Cleanser: Wound Cleanser (Generic) 1 x Per Week/15 Days Discharge Instructions: Cleanse the wound with wound cleanser prior to  applying a clean dressing using gauze sponges, not tissue or cotton balls. Cleanser: Soap and Water 1 x Per Week/15 Days Discharge Instructions: May shower and wash wound with dial antibacterial soap and water prior to dressing change. Peri-Wound Care: Triamcinolone 15 (g) 1 x Per Week/15 Days Discharge Instructions: Use triamcinolone 15 (g) as directed Peri-Wound Care: Sween Lotion (Moisturizing lotion) 1 x Per Week/15 Days Discharge Instructions: Apply moisturizing lotion as directed Prim Dressing: KerraCel Ag Gelling Fiber Dressing, 4x5 in (silver alginate) (Generic) 1 x Per Week/15 Days ary Discharge Instructions: Apply silver alginate to wound bed as instructed Secondary Dressing: Woven Gauze Sponge, Non-Sterile 4x4 in (Generic) 1 x Per Week/15 Days Discharge Instructions: Apply over primary dressing as directed. Secondary Dressing: ABD Pad, 5x9 1 x Per Week/15 Days Discharge Instructions: Apply over primary dressing as directed. Compression Wrap: ThreePress (3 layer compression wrap) 1 x Per Week/15 Days Discharge Instructions: Apply three layer compression as directed. Wound #25 - Lower Leg Wound Laterality: Right, Lateral Cleanser: Wound Cleanser (Generic) 1 x Per Week/15 Days Discharge Instructions: Cleanse the wound with wound cleanser prior to applying a clean dressing using gauze sponges, not tissue or cotton balls. Cleanser: Soap and Water 1 x Per Week/15 Days Discharge Instructions: May shower and wash wound with dial antibacterial soap and water prior to dressing change. Peri-Wound Care: Triamcinolone 15 (g) 1 x Per Week/15 Days Discharge Instructions: Use triamcinolone 15 (g) as directed Peri-Wound Care: Sween Lotion (Moisturizing lotion) 1 x Per Week/15 Days Discharge Instructions: Apply moisturizing lotion as directed Prim Dressing: KerraCel Ag Gelling Fiber Dressing, 4x5 in (silver alginate) (Generic) 1 x Per Week/15 Days ary Discharge Instructions: Apply silver alginate  to wound bed as instructed Secondary Dressing: Woven Gauze Sponge, Non-Sterile 4x4 in (Generic) 1 x Per Week/15 Days Discharge Instructions: Apply over primary dressing as directed. Secondary Dressing: ABD Pad, 5x9 1 x Per Week/15 Days Discharge Instructions: Apply over primary dressing as directed. Compression Wrap: ThreePress (3 layer compression wrap) 1 x Per Week/15 Days Discharge Instructions: Apply three layer compression as directed. Wound #26 - Foot Wound Laterality: Dorsal, Left  Date of Service: [name] 07/19/2020 9:30 A M Medical Record Number: [id number] Patient Account Number: [id number] Date of Birth/Sex: Treating RN: 06/05/1945 (75 y.o. [name], [name] Primary Care Provider: [name], [name] Other Clinician: Referring Provider: Treating Provider/Extender: Elonda Giuliano [name], [name] Weeks in Treatment: 222 Verbal / Phone Orders: No Diagnosis Coding ICD-10 Coding Code Description E11.621 Type 2 diabetes mellitus with foot ulcer L89.613 Pressure ulcer of right heel, stage 3 L97.518 Non-pressure chronic ulcer of other part of right foot with other specified severity L97.221 Non-pressure chronic ulcer of left calf limited to breakdown of skin L97.321 Non-pressure chronic ulcer of left ankle limited to breakdown of skin L97.521 Non-pressure chronic ulcer of other part of left foot limited to breakdown of skin L97.524 Non-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 Follow-up Appointments Return Appointment in 2 weeks. Bathing/ Shower/ Hygiene May shower with protection but do not get wound dressing(s) wet. Edema Control - Lymphedema / SCD / Other Elevate legs to the level of the heart or above for 30 minutes daily and/or when sitting, a frequency of: Avoid standing for long periods of time. Off-Loading Other: - multipodus boots to both feet Wound Treatment Wound #14 - Lower Leg Wound Laterality: Left, Lateral Cleanser: Wound Cleanser (Generic) 1 x Per Week/15 Days Discharge Instructions: Cleanse the wound with wound cleanser prior to  applying a clean dressing using gauze sponges, not tissue or cotton balls. Cleanser: Soap and Water 1 x Per Week/15 Days Discharge Instructions: May shower and wash wound with dial antibacterial soap and water prior to dressing change. Peri-Wound Care: Triamcinolone 15 (g) 1 x Per Week/15 Days Discharge Instructions: Use triamcinolone 15 (g) as directed Peri-Wound Care: Sween Lotion (Moisturizing lotion) 1 x Per Week/15 Days Discharge Instructions: Apply moisturizing lotion as directed Prim Dressing: KerraCel Ag Gelling Fiber Dressing, 4x5 in (silver alginate) (Generic) 1 x Per Week/15 Days ary Discharge Instructions: Apply silver alginate to wound bed as instructed Secondary Dressing: Woven Gauze Sponge, Non-Sterile 4x4 in (Generic) 1 x Per Week/15 Days Discharge Instructions: Apply over primary dressing as directed. Secondary Dressing: ABD Pad, 5x9 1 x Per Week/15 Days Discharge Instructions: Apply over primary dressing as directed. Compression Wrap: ThreePress (3 layer compression wrap) 1 x Per Week/15 Days Discharge Instructions: Apply three layer compression as directed. Wound #25 - Lower Leg Wound Laterality: Right, Lateral Cleanser: Wound Cleanser (Generic) 1 x Per Week/15 Days Discharge Instructions: Cleanse the wound with wound cleanser prior to applying a clean dressing using gauze sponges, not tissue or cotton balls. Cleanser: Soap and Water 1 x Per Week/15 Days Discharge Instructions: May shower and wash wound with dial antibacterial soap and water prior to dressing change. Peri-Wound Care: Triamcinolone 15 (g) 1 x Per Week/15 Days Discharge Instructions: Use triamcinolone 15 (g) as directed Peri-Wound Care: Sween Lotion (Moisturizing lotion) 1 x Per Week/15 Days Discharge Instructions: Apply moisturizing lotion as directed Prim Dressing: KerraCel Ag Gelling Fiber Dressing, 4x5 in (silver alginate) (Generic) 1 x Per Week/15 Days ary Discharge Instructions: Apply silver alginate  to wound bed as instructed Secondary Dressing: Woven Gauze Sponge, Non-Sterile 4x4 in (Generic) 1 x Per Week/15 Days Discharge Instructions: Apply over primary dressing as directed. Secondary Dressing: ABD Pad, 5x9 1 x Per Week/15 Days Discharge Instructions: Apply over primary dressing as directed. Compression Wrap: ThreePress (3 layer compression wrap) 1 x Per Week/15 Days Discharge Instructions: Apply three layer compression as directed. Wound #26 - Foot Wound Laterality: Dorsal, Left  YENIEL, HARBOLD ([id number]) Visit Report for 07/19/2020 Debridement Details Patient Name: Date of Service: [name] [facility] F. 07/19/2020 9:30 A M Medical Record Number: [id number] Patient Account Number: [id number] Date of Birth/Sex: Treating RN: [birth year] (75 y.o. [name], [name] Primary Care Provider: [name], [name] Other Clinician: Referring Provider: Treating Provider/Extender: Anaisabel Pederson [name], [name] Weeks in Treatment: 222 Debridement Performed for Assessment: Wound #26 Left,Dorsal Foot Performed By: Physician [name], MD Debridement Type: Debridement Severity of Tissue Pre Debridement: Fat layer exposed Level of Consciousness (Pre-procedure): Awake and Alert Pre-procedure Verification/Time Out Yes - 10:55 Taken: Start Time: 10:55 Pain Control: Lidocaine T Area Debrided (L x W): otal 4 (cm) x 4 (cm) = 16 (cm) Tissue and other material debrided: Viable, Non-Viable, Slough, Subcutaneous, Tendon, Skin: Dermis , Skin: Epidermis, Slough Level: Skin/Subcutaneous Tissue/Muscle Debridement Description: Excisional Instrument: Blade, Curette, Forceps Specimen: Tissue Culture Number of Specimens T aken: 1 Bleeding: Minimum Hemostasis Achieved: Pressure End Time: 10:56 Procedural Pain: 0 Post Procedural Pain: 0 Response to Treatment: Procedure was tolerated well Level of Consciousness (Post- Awake and Alert procedure): Post Debridement Measurements of Total Wound Length: (cm) 4 Width: (cm) 4 Depth: (cm) 0.1 Volume: (cm) 1.257 Character of Wound/Ulcer Post Debridement: Improved Severity of Tissue Post Debridement: Fat layer exposed Post Procedure Diagnosis Same as Pre-procedure Electronic Signature(s) Signed: 07/19/2020 5:10:40 PM By: [name] Signed: 07/21/2020 7:47:56 AM By: Linton Ham MD Entered By: Linton Ham on 07/19/2020 11:03:42 -------------------------------------------------------------------------------- Debridement  Details Patient Name: Date of Service: [name] 07/19/2020 9:30 A M Medical Record Number: [id number] Patient Account Number: [id number] Date of Birth/Sex: Treating RN: 1945/06/23 (75 y.o. [name], [name] Primary Care Provider: [name], [name] Other Clinician: Referring Provider: Treating Provider/Extender: Aj Crunkleton [name], [name] Weeks in Treatment: 222 Debridement Performed for Assessment: Wound #32 Left,Distal,Dorsal Foot Performed By: Physician [name], MD Debridement Type: Debridement Severity of Tissue Pre Debridement: Fat layer exposed Level of Consciousness (Pre-procedure): Awake and Alert Pre-procedure Verification/Time Out Yes - 10:57 Taken: Start Time: 10:57 Pain Control: Lidocaine T Area Debrided (L x W): otal 1.5 (cm) x 1 (cm) = 1.5 (cm) Tissue and other material debrided: Viable, Non-Viable, Slough, Subcutaneous, Skin: Dermis , Skin: Epidermis, Slough Level: Skin/Subcutaneous Tissue Debridement Description: Excisional Instrument: Blade, Curette, Forceps Bleeding: Minimum Hemostasis Achieved: Pressure End Time: 10:58 Procedural Pain: 0 Post Procedural Pain: 0 Response to Treatment: Procedure was tolerated well Level of Consciousness (Post- Awake and Alert procedure): Post Debridement Measurements of Total Wound Length: (cm) 1.5 Width: (cm) 1 Depth: (cm) 0.3 Volume: (cm) 0.353 Character of Wound/Ulcer Post Debridement: Improved Severity of Tissue Post Debridement: Fat layer exposed Post Procedure Diagnosis Same as Pre-procedure Electronic Signature(s) Signed: 07/19/2020 5:10:40 PM By: [name] Signed: 07/21/2020 7:47:56 AM By: Linton Ham MD Entered By: Linton Ham on 07/19/2020 11:04:00 -------------------------------------------------------------------------------- Debridement Details Patient Name: Date of Service: [name] 07/19/2020 9:30 A M Medical Record Number: [id number] Patient  Account Number: [id number] Date of Birth/Sex: Treating RN: 03/25/1946 (75 y.o. [name], [name] Primary Care Provider: [name], [name] Other Clinician: Referring Provider: Treating Provider/Extender: Alexandr Oehler [name], [name] Weeks in Treatment: 222 Debridement Performed for Assessment: Wound #35 Left,Lateral,Dorsal Foot Performed By: Physician [name], MD Debridement Type: Debridement Severity of Tissue Pre Debridement: Fat layer exposed Level of Consciousness (Pre-procedure): Awake and Alert Pre-procedure Verification/Time Out Yes - 10:56 Taken: Start Time: 10:56 Pain Control: Lidocaine T Area Debrided (L x W): otal 1.4 (cm) x 1.5 (cm) =  Date of Service: [name] 07/19/2020 9:30 A M Medical Record Number: [id number] Patient Account Number: [id number] Date of Birth/Sex: Treating RN: 06/05/1945 (75 y.o. [name], [name] Primary Care Provider: [name], [name] Other Clinician: Referring Provider: Treating Provider/Extender: Elonda Giuliano [name], [name] Weeks in Treatment: 222 Verbal / Phone Orders: No Diagnosis Coding ICD-10 Coding Code Description E11.621 Type 2 diabetes mellitus with foot ulcer L89.613 Pressure ulcer of right heel, stage 3 L97.518 Non-pressure chronic ulcer of other part of right foot with other specified severity L97.221 Non-pressure chronic ulcer of left calf limited to breakdown of skin L97.321 Non-pressure chronic ulcer of left ankle limited to breakdown of skin L97.521 Non-pressure chronic ulcer of other part of left foot limited to breakdown of skin L97.524 Non-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 Follow-up Appointments Return Appointment in 2 weeks. Bathing/ Shower/ Hygiene May shower with protection but do not get wound dressing(s) wet. Edema Control - Lymphedema / SCD / Other Elevate legs to the level of the heart or above for 30 minutes daily and/or when sitting, a frequency of: Avoid standing for long periods of time. Off-Loading Other: - multipodus boots to both feet Wound Treatment Wound #14 - Lower Leg Wound Laterality: Left, Lateral Cleanser: Wound Cleanser (Generic) 1 x Per Week/15 Days Discharge Instructions: Cleanse the wound with wound cleanser prior to  applying a clean dressing using gauze sponges, not tissue or cotton balls. Cleanser: Soap and Water 1 x Per Week/15 Days Discharge Instructions: May shower and wash wound with dial antibacterial soap and water prior to dressing change. Peri-Wound Care: Triamcinolone 15 (g) 1 x Per Week/15 Days Discharge Instructions: Use triamcinolone 15 (g) as directed Peri-Wound Care: Sween Lotion (Moisturizing lotion) 1 x Per Week/15 Days Discharge Instructions: Apply moisturizing lotion as directed Prim Dressing: KerraCel Ag Gelling Fiber Dressing, 4x5 in (silver alginate) (Generic) 1 x Per Week/15 Days ary Discharge Instructions: Apply silver alginate to wound bed as instructed Secondary Dressing: Woven Gauze Sponge, Non-Sterile 4x4 in (Generic) 1 x Per Week/15 Days Discharge Instructions: Apply over primary dressing as directed. Secondary Dressing: ABD Pad, 5x9 1 x Per Week/15 Days Discharge Instructions: Apply over primary dressing as directed. Compression Wrap: ThreePress (3 layer compression wrap) 1 x Per Week/15 Days Discharge Instructions: Apply three layer compression as directed. Wound #25 - Lower Leg Wound Laterality: Right, Lateral Cleanser: Wound Cleanser (Generic) 1 x Per Week/15 Days Discharge Instructions: Cleanse the wound with wound cleanser prior to applying a clean dressing using gauze sponges, not tissue or cotton balls. Cleanser: Soap and Water 1 x Per Week/15 Days Discharge Instructions: May shower and wash wound with dial antibacterial soap and water prior to dressing change. Peri-Wound Care: Triamcinolone 15 (g) 1 x Per Week/15 Days Discharge Instructions: Use triamcinolone 15 (g) as directed Peri-Wound Care: Sween Lotion (Moisturizing lotion) 1 x Per Week/15 Days Discharge Instructions: Apply moisturizing lotion as directed Prim Dressing: KerraCel Ag Gelling Fiber Dressing, 4x5 in (silver alginate) (Generic) 1 x Per Week/15 Days ary Discharge Instructions: Apply silver alginate  to wound bed as instructed Secondary Dressing: Woven Gauze Sponge, Non-Sterile 4x4 in (Generic) 1 x Per Week/15 Days Discharge Instructions: Apply over primary dressing as directed. Secondary Dressing: ABD Pad, 5x9 1 x Per Week/15 Days Discharge Instructions: Apply over primary dressing as directed. Compression Wrap: ThreePress (3 layer compression wrap) 1 x Per Week/15 Days Discharge Instructions: Apply three layer compression as directed. Wound #26 - Foot Wound Laterality: Dorsal, Left  Date of Service: [name] 07/19/2020 9:30 A M Medical Record Number: [id number] Patient Account Number: [id number] Date of Birth/Sex: Treating RN: 06/05/1945 (75 y.o. [name], [name] Primary Care Provider: [name], [name] Other Clinician: Referring Provider: Treating Provider/Extender: Elonda Giuliano [name], [name] Weeks in Treatment: 222 Verbal / Phone Orders: No Diagnosis Coding ICD-10 Coding Code Description E11.621 Type 2 diabetes mellitus with foot ulcer L89.613 Pressure ulcer of right heel, stage 3 L97.518 Non-pressure chronic ulcer of other part of right foot with other specified severity L97.221 Non-pressure chronic ulcer of left calf limited to breakdown of skin L97.321 Non-pressure chronic ulcer of left ankle limited to breakdown of skin L97.521 Non-pressure chronic ulcer of other part of left foot limited to breakdown of skin L97.524 Non-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 Follow-up Appointments Return Appointment in 2 weeks. Bathing/ Shower/ Hygiene May shower with protection but do not get wound dressing(s) wet. Edema Control - Lymphedema / SCD / Other Elevate legs to the level of the heart or above for 30 minutes daily and/or when sitting, a frequency of: Avoid standing for long periods of time. Off-Loading Other: - multipodus boots to both feet Wound Treatment Wound #14 - Lower Leg Wound Laterality: Left, Lateral Cleanser: Wound Cleanser (Generic) 1 x Per Week/15 Days Discharge Instructions: Cleanse the wound with wound cleanser prior to  applying a clean dressing using gauze sponges, not tissue or cotton balls. Cleanser: Soap and Water 1 x Per Week/15 Days Discharge Instructions: May shower and wash wound with dial antibacterial soap and water prior to dressing change. Peri-Wound Care: Triamcinolone 15 (g) 1 x Per Week/15 Days Discharge Instructions: Use triamcinolone 15 (g) as directed Peri-Wound Care: Sween Lotion (Moisturizing lotion) 1 x Per Week/15 Days Discharge Instructions: Apply moisturizing lotion as directed Prim Dressing: KerraCel Ag Gelling Fiber Dressing, 4x5 in (silver alginate) (Generic) 1 x Per Week/15 Days ary Discharge Instructions: Apply silver alginate to wound bed as instructed Secondary Dressing: Woven Gauze Sponge, Non-Sterile 4x4 in (Generic) 1 x Per Week/15 Days Discharge Instructions: Apply over primary dressing as directed. Secondary Dressing: ABD Pad, 5x9 1 x Per Week/15 Days Discharge Instructions: Apply over primary dressing as directed. Compression Wrap: ThreePress (3 layer compression wrap) 1 x Per Week/15 Days Discharge Instructions: Apply three layer compression as directed. Wound #25 - Lower Leg Wound Laterality: Right, Lateral Cleanser: Wound Cleanser (Generic) 1 x Per Week/15 Days Discharge Instructions: Cleanse the wound with wound cleanser prior to applying a clean dressing using gauze sponges, not tissue or cotton balls. Cleanser: Soap and Water 1 x Per Week/15 Days Discharge Instructions: May shower and wash wound with dial antibacterial soap and water prior to dressing change. Peri-Wound Care: Triamcinolone 15 (g) 1 x Per Week/15 Days Discharge Instructions: Use triamcinolone 15 (g) as directed Peri-Wound Care: Sween Lotion (Moisturizing lotion) 1 x Per Week/15 Days Discharge Instructions: Apply moisturizing lotion as directed Prim Dressing: KerraCel Ag Gelling Fiber Dressing, 4x5 in (silver alginate) (Generic) 1 x Per Week/15 Days ary Discharge Instructions: Apply silver alginate  to wound bed as instructed Secondary Dressing: Woven Gauze Sponge, Non-Sterile 4x4 in (Generic) 1 x Per Week/15 Days Discharge Instructions: Apply over primary dressing as directed. Secondary Dressing: ABD Pad, 5x9 1 x Per Week/15 Days Discharge Instructions: Apply over primary dressing as directed. Compression Wrap: ThreePress (3 layer compression wrap) 1 x Per Week/15 Days Discharge Instructions: Apply three layer compression as directed. Wound #26 - Foot Wound Laterality: Dorsal, Left  [id number] Date of Birth/Sex: Treating RN: January 04, 1946 (75 y.o. [name], [name] Primary Care Provider: [name], [name] Other Clinician: Referring Provider: Treating Provider/Extender: Neilani Duffee [name], [name] Weeks in Treatment: 222 Active Problems ICD-10 Encounter Code Description Active Date MDM Diagnosis E11.621 Type 2 diabetes mellitus with foot ulcer 04/17/2016 No Yes L89.613 Pressure ulcer of right heel, stage 3 04/17/2016 No Yes L97.518 Non-pressure chronic ulcer of other part of right foot with other specified 05/17/2020 No Yes severity L97.221 Non-pressure chronic ulcer of left calf limited to breakdown of skin 08/15/2017 No Yes L97.321 Non-pressure chronic ulcer of left ankle limited to breakdown of skin 07/01/2018 No Yes L97.521 Non-pressure chronic ulcer of other part of left foot limited to breakdown of 02/17/2019 No Yes skin L97.524 Non-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 06/14/2020 No  Yes Inactive Problems ICD-10 Code Description Active Date Inactive Date L03.032 Cellulitis of left toe 01/06/2019 01/06/2019 [id number] Pressure ulcer of left heel, stage 3 12/04/2016 12/04/2016 I25.119 Atherosclerotic heart disease of native coronary artery with unspecified angina 04/17/2016 04/17/2016 pectoris L97.821 Non-pressure chronic ulcer of other part of left lower leg limited to breakdown of skin 01/06/2019 01/06/2019 S51.811D Laceration without foreign body of right forearm, subsequent encounter 10/22/2017 10/22/2017 [id number] Acute diastolic (congestive) heart failure 04/17/2016 04/17/2016 L03.116 Cellulitis of left lower limb 12/24/2017 12/24/2017 L89.620 Pressure ulcer of left heel, unstageable 07/21/2019 07/21/2019 L97.211 Non-pressure chronic ulcer of right calf limited to breakdown of skin 07/21/2019 07/21/2019 S80.211D Abrasion, right knee, subsequent encounter 08/18/2019 08/18/2019 Resolved Problems ICD-10 Code Description Active Date Resolved Date L89.512 Pressure ulcer of right ankle, stage 2 04/17/2016 04/17/2016 L89.522 Pressure ulcer of left ankle, stage 2 04/17/2016 04/17/2016 Electronic Signature(s) Signed: 07/21/2020 7:47:56 AM By: Linton Ham MD Entered By: Linton Ham on 07/19/2020 11:02:44 -------------------------------------------------------------------------------- Progress Note Details Patient Name: Date of Service: [name] 07/19/2020 9:30 A M Medical Record Number: [id number] Patient Account Number: [id number] Date of Birth/Sex: Treating RN: Feb 07, 1946 (75 y.o. [name], [name] Primary Care Provider: [name], [name] Other Clinician: Referring Provider: Treating Provider/Extender: Adean Milosevic [name], [name] Weeks in Treatment: 222 Subjective History of Present Illness (HPI) The following HPI elements were documented for the patient's wound: Location: On the left and right lateral forefoot which has been there for about 6 months Quality: Patient  reports No Pain. Severity: Patient states wound(s) are getting worse. Duration: Patient has had the wound for > 6 months prior to seeking treatment at the wound center Context: The wound would happen gradually Modifying Factors: Patient wound(s)/ulcer(s) are worsening due to :continual drainage from the wound Associated Signs and Symptoms: Patient reports having increase discharge. This patient returns after being seen here till the end of August and he was lost to follow-up. he has been quite debilitated laying in bed most of the time and his condition has deteriorated significantly. He has multiple ulcerations on the heel lateral forefoot and some of his toes. ======== Old notes: 75 year old male known to our practice when he was seen here in February and March and was lost to follow-up when he was admitted to hospital with various medical problems including coronary artery disease and a stroke. Now returns with the problem on the left forefoot where he has an ulceration and this has been there for about 6 months. most recently he was in hospital between July 6 and July 16, when he was admitted and treated for acute respiratory failure is secondary to aspiration pneumonia, large non-STEMI, ischemic cardiomyopathy  [id number] Date of Birth/Sex: Treating RN: January 04, 1946 (75 y.o. [name], [name] Primary Care Provider: [name], [name] Other Clinician: Referring Provider: Treating Provider/Extender: Neilani Duffee [name], [name] Weeks in Treatment: 222 Active Problems ICD-10 Encounter Code Description Active Date MDM Diagnosis E11.621 Type 2 diabetes mellitus with foot ulcer 04/17/2016 No Yes L89.613 Pressure ulcer of right heel, stage 3 04/17/2016 No Yes L97.518 Non-pressure chronic ulcer of other part of right foot with other specified 05/17/2020 No Yes severity L97.221 Non-pressure chronic ulcer of left calf limited to breakdown of skin 08/15/2017 No Yes L97.321 Non-pressure chronic ulcer of left ankle limited to breakdown of skin 07/01/2018 No Yes L97.521 Non-pressure chronic ulcer of other part of left foot limited to breakdown of 02/17/2019 No Yes skin L97.524 Non-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 06/14/2020 No  Yes Inactive Problems ICD-10 Code Description Active Date Inactive Date L03.032 Cellulitis of left toe 01/06/2019 01/06/2019 [id number] Pressure ulcer of left heel, stage 3 12/04/2016 12/04/2016 I25.119 Atherosclerotic heart disease of native coronary artery with unspecified angina 04/17/2016 04/17/2016 pectoris L97.821 Non-pressure chronic ulcer of other part of left lower leg limited to breakdown of skin 01/06/2019 01/06/2019 S51.811D Laceration without foreign body of right forearm, subsequent encounter 10/22/2017 10/22/2017 [id number] Acute diastolic (congestive) heart failure 04/17/2016 04/17/2016 L03.116 Cellulitis of left lower limb 12/24/2017 12/24/2017 L89.620 Pressure ulcer of left heel, unstageable 07/21/2019 07/21/2019 L97.211 Non-pressure chronic ulcer of right calf limited to breakdown of skin 07/21/2019 07/21/2019 S80.211D Abrasion, right knee, subsequent encounter 08/18/2019 08/18/2019 Resolved Problems ICD-10 Code Description Active Date Resolved Date L89.512 Pressure ulcer of right ankle, stage 2 04/17/2016 04/17/2016 L89.522 Pressure ulcer of left ankle, stage 2 04/17/2016 04/17/2016 Electronic Signature(s) Signed: 07/21/2020 7:47:56 AM By: Linton Ham MD Entered By: Linton Ham on 07/19/2020 11:02:44 -------------------------------------------------------------------------------- Progress Note Details Patient Name: Date of Service: [name] 07/19/2020 9:30 A M Medical Record Number: [id number] Patient Account Number: [id number] Date of Birth/Sex: Treating RN: Feb 07, 1946 (75 y.o. [name], [name] Primary Care Provider: [name], [name] Other Clinician: Referring Provider: Treating Provider/Extender: Adean Milosevic [name], [name] Weeks in Treatment: 222 Subjective History of Present Illness (HPI) The following HPI elements were documented for the patient's wound: Location: On the left and right lateral forefoot which has been there for about 6 months Quality: Patient  reports No Pain. Severity: Patient states wound(s) are getting worse. Duration: Patient has had the wound for > 6 months prior to seeking treatment at the wound center Context: The wound would happen gradually Modifying Factors: Patient wound(s)/ulcer(s) are worsening due to :continual drainage from the wound Associated Signs and Symptoms: Patient reports having increase discharge. This patient returns after being seen here till the end of August and he was lost to follow-up. he has been quite debilitated laying in bed most of the time and his condition has deteriorated significantly. He has multiple ulcerations on the heel lateral forefoot and some of his toes. ======== Old notes: 75 year old male known to our practice when he was seen here in February and March and was lost to follow-up when he was admitted to hospital with various medical problems including coronary artery disease and a stroke. Now returns with the problem on the left forefoot where he has an ulceration and this has been there for about 6 months. most recently he was in hospital between July 6 and July 16, when he was admitted and treated for acute respiratory failure is secondary to aspiration pneumonia, large non-STEMI, ischemic cardiomyopathy  YENIEL, HARBOLD ([id number]) Visit Report for 07/19/2020 Debridement Details Patient Name: Date of Service: [name] [facility] F. 07/19/2020 9:30 A M Medical Record Number: [id number] Patient Account Number: [id number] Date of Birth/Sex: Treating RN: [birth year] (75 y.o. [name], [name] Primary Care Provider: [name], [name] Other Clinician: Referring Provider: Treating Provider/Extender: Anaisabel Pederson [name], [name] Weeks in Treatment: 222 Debridement Performed for Assessment: Wound #26 Left,Dorsal Foot Performed By: Physician [name], MD Debridement Type: Debridement Severity of Tissue Pre Debridement: Fat layer exposed Level of Consciousness (Pre-procedure): Awake and Alert Pre-procedure Verification/Time Out Yes - 10:55 Taken: Start Time: 10:55 Pain Control: Lidocaine T Area Debrided (L x W): otal 4 (cm) x 4 (cm) = 16 (cm) Tissue and other material debrided: Viable, Non-Viable, Slough, Subcutaneous, Tendon, Skin: Dermis , Skin: Epidermis, Slough Level: Skin/Subcutaneous Tissue/Muscle Debridement Description: Excisional Instrument: Blade, Curette, Forceps Specimen: Tissue Culture Number of Specimens T aken: 1 Bleeding: Minimum Hemostasis Achieved: Pressure End Time: 10:56 Procedural Pain: 0 Post Procedural Pain: 0 Response to Treatment: Procedure was tolerated well Level of Consciousness (Post- Awake and Alert procedure): Post Debridement Measurements of Total Wound Length: (cm) 4 Width: (cm) 4 Depth: (cm) 0.1 Volume: (cm) 1.257 Character of Wound/Ulcer Post Debridement: Improved Severity of Tissue Post Debridement: Fat layer exposed Post Procedure Diagnosis Same as Pre-procedure Electronic Signature(s) Signed: 07/19/2020 5:10:40 PM By: [name] Signed: 07/21/2020 7:47:56 AM By: Linton Ham MD Entered By: Linton Ham on 07/19/2020 11:03:42 -------------------------------------------------------------------------------- Debridement  Details Patient Name: Date of Service: [name] 07/19/2020 9:30 A M Medical Record Number: [id number] Patient Account Number: [id number] Date of Birth/Sex: Treating RN: 1945/06/23 (75 y.o. [name], [name] Primary Care Provider: [name], [name] Other Clinician: Referring Provider: Treating Provider/Extender: Aj Crunkleton [name], [name] Weeks in Treatment: 222 Debridement Performed for Assessment: Wound #32 Left,Distal,Dorsal Foot Performed By: Physician [name], MD Debridement Type: Debridement Severity of Tissue Pre Debridement: Fat layer exposed Level of Consciousness (Pre-procedure): Awake and Alert Pre-procedure Verification/Time Out Yes - 10:57 Taken: Start Time: 10:57 Pain Control: Lidocaine T Area Debrided (L x W): otal 1.5 (cm) x 1 (cm) = 1.5 (cm) Tissue and other material debrided: Viable, Non-Viable, Slough, Subcutaneous, Skin: Dermis , Skin: Epidermis, Slough Level: Skin/Subcutaneous Tissue Debridement Description: Excisional Instrument: Blade, Curette, Forceps Bleeding: Minimum Hemostasis Achieved: Pressure End Time: 10:58 Procedural Pain: 0 Post Procedural Pain: 0 Response to Treatment: Procedure was tolerated well Level of Consciousness (Post- Awake and Alert procedure): Post Debridement Measurements of Total Wound Length: (cm) 1.5 Width: (cm) 1 Depth: (cm) 0.3 Volume: (cm) 0.353 Character of Wound/Ulcer Post Debridement: Improved Severity of Tissue Post Debridement: Fat layer exposed Post Procedure Diagnosis Same as Pre-procedure Electronic Signature(s) Signed: 07/19/2020 5:10:40 PM By: [name] Signed: 07/21/2020 7:47:56 AM By: Linton Ham MD Entered By: Linton Ham on 07/19/2020 11:04:00 -------------------------------------------------------------------------------- Debridement Details Patient Name: Date of Service: [name] 07/19/2020 9:30 A M Medical Record Number: [id number] Patient  Account Number: [id number] Date of Birth/Sex: Treating RN: 03/25/1946 (75 y.o. [name], [name] Primary Care Provider: [name], [name] Other Clinician: Referring Provider: Treating Provider/Extender: Alexandr Oehler [name], [name] Weeks in Treatment: 222 Debridement Performed for Assessment: Wound #35 Left,Lateral,Dorsal Foot Performed By: Physician [name], MD Debridement Type: Debridement Severity of Tissue Pre Debridement: Fat layer exposed Level of Consciousness (Pre-procedure): Awake and Alert Pre-procedure Verification/Time Out Yes - 10:56 Taken: Start Time: 10:56 Pain Control: Lidocaine T Area Debrided (L x W): otal 1.4 (cm) x 1.5 (cm) =

## 2020-07-21 NOTE — Progress Notes (Signed)
Distinct, outline attached Exudate Amount: Medium Exudate Type: Serosanguineous Exudate Color: red, brown Foul Odor After Cleansing: No Slough/Fibrino No Wound Bed Granulation Amount: Large (67-100%) Exposed Structure Granulation Quality: Red, Pink Fascia Exposed: No Necrotic Amount: None Present (0%) Fat Layer (Subcutaneous Tissue) Exposed: Yes Tendon Exposed: No Muscle Exposed: No Joint Exposed: No Bone Exposed: No Treatment Notes Wound #28 (Foot) Wound Laterality: Dorsal, Right Cleanser Wound Cleanser Discharge Instruction: Cleanse the wound with wound cleanser prior to applying a clean dressing using gauze sponges, not tissue or cotton balls. Soap and Water Discharge Instruction: May shower and wash wound with dial antibacterial soap and water prior to dressing change. Peri-Wound Care Triamcinolone 15 (g) Discharge Instruction: Use triamcinolone 15 (g) as directed Sween Lotion (Moisturizing lotion) Discharge Instruction: Apply moisturizing lotion as directed Topical Primary Dressing KerraCel Ag Gelling Fiber Dressing, 4x5 in (silver alginate) Discharge Instruction: Apply silver alginate to wound bed as instructed Secondary Dressing Woven Gauze Sponge, Non-Sterile 4x4 in Discharge Instruction: Apply over primary dressing as directed. ABD Pad, 5x9 Discharge Instruction: Apply over primary dressing as directed. Secured With Compression Wrap ThreePress (3 layer compression wrap) Discharge Instruction: Apply three layer compression as directed. Compression Stockings Add-Ons Electronic Signature(s) Signed: 07/19/2020 5:12:57 PM By: Lorrin Jackson Signed: 07/20/2020 9:05:34 AM By: Sandre Kitty Entered By: Sandre Kitty on 07/19/2020  16:57:01 -------------------------------------------------------------------------------- Wound Assessment Details Patient Name: Date of Service: [name] Foley F. 07/19/2020 9:30 A M Medical Record Number: [id number] Patient Account Number: [id number] Date of Birth/Sex: Treating RN: [birth year] (75 y.o. Marcheta Grammes Primary Care Kendall Justo: [name], [name] Other Clinician: Referring Medard Decuir: Treating Dawanda Mapel/Extender: [name], [name] [name], [name] Weeks in Treatment: 222 Wound Status Wound Number: 29 Primary Diabetic Wound/Ulcer of the Lower Extremity Etiology: Wound Location: Right Calcaneus Wound Open Wounding Event: Gradually Appeared Status: Date Acquired: 04/26/2020 Comorbid Anemia, Sleep Apnea, Arrhythmia, Congestive Heart Failure, Weeks Of Treatment: 12 History: Coronary Artery Disease, Hypertension, Myocardial Infarction, Clustered Wound: No Type II Diabetes, Gout, Neuropathy Photos Wound Measurements Length: (cm) 0.2 Width: (cm) 0.1 Depth: (cm) 0.1 Area: (cm) 0.016 Volume: (cm) 0.002 % Reduction in Area: 99.1% % Reduction in Volume: 98.9% Epithelialization: Large (67-100%) Tunneling: No Undermining: No Wound Description Classification: Grade 1 Wound Margin: Flat and Intact Exudate Amount: Small Exudate Type: Serosanguineous Exudate Color: red, brown Foul Odor After Cleansing: No Slough/Fibrino No Wound Bed Granulation Amount: Large (67-100%) Exposed Structure Granulation Quality: Red, Pink Fascia Exposed: No Necrotic Amount: None Present (0%) Fat Layer (Subcutaneous Tissue) Exposed: No Tendon Exposed: No Muscle Exposed: No Joint Exposed: No Bone Exposed: No Treatment Notes Wound #29 (Calcaneus) Wound Laterality: Right Cleanser Wound Cleanser Discharge Instruction: Cleanse the wound with wound cleanser prior to applying a clean dressing using gauze sponges, not tissue or cotton balls. Soap and Water Discharge Instruction: May shower and wash  wound with dial antibacterial soap and water prior to dressing change. Peri-Wound Care Triamcinolone 15 (g) Discharge Instruction: Use triamcinolone 15 (g) as directed Sween Lotion (Moisturizing lotion) Discharge Instruction: Apply moisturizing lotion as directed Topical Primary Dressing KerraCel Ag Gelling Fiber Dressing, 4x5 in (silver alginate) Discharge Instruction: Apply silver alginate to wound bed as instructed Secondary Dressing Woven Gauze Sponge, Non-Sterile 4x4 in Discharge Instruction: Apply over primary dressing as directed. ABD Pad, 5x9 Discharge Instruction: Apply over primary dressing as directed. Secured With Compression Wrap ThreePress (3 layer compression wrap) Discharge Instruction: Apply three layer compression as directed. Compression Stockings Add-Ons Electronic Signature(s) Signed: 07/19/2020 5:12:57 PM By: Lorrin Jackson Signed: 07/20/2020 9:05:34 AM By:  Rudean Curt, Destiny Entered By: Sandre Kitty on 07/19/2020 16:54:33 -------------------------------------------------------------------------------- Wound Assessment Details Patient Name: Date of Service: [name] [name] 07/19/2020 9:30 A M Medical Record Number: [id number] Patient Account Number: [id number] Date of Birth/Sex: Treating RN: Aug 08, 1945 (75 y.o. Marcheta Grammes Primary Care Emori Kamau: [name], [name] Other Clinician: Referring Breyah Akhter: Treating Sinai Mahany/Extender: [name], [name] [name], [name] Weeks in Treatment: 222 Wound Status Wound Number: 32 Primary Diabetic Wound/Ulcer of the Lower Extremity Etiology: Wound Location: Left, Distal, Dorsal Foot Wound Open Wounding Event: Gradually Appeared Status: Date Acquired: 05/03/2020 Comorbid Anemia, Sleep Apnea, Arrhythmia, Congestive Heart Failure, Weeks Of Treatment: 11 History: Coronary Artery Disease, Hypertension, Myocardial Infarction, Clustered Wound: No Type II Diabetes, Gout, Neuropathy Photos Wound Measurements Length:  (cm) 1.5 Width: (cm) 1 Depth: (cm) 0.3 Area: (cm) 1.178 Volume: (cm) 0.353 % Reduction in Area: 32.1% % Reduction in Volume: -102.9% Epithelialization: Small (1-33%) Tunneling: No Undermining: No Wound Description Classification: Grade 2 Wound Margin: Well defined, not attached Exudate Amount: Medium Exudate Type: Serosanguineous Exudate Color: red, brown Foul Odor After Cleansing: No Slough/Fibrino Yes Wound Bed Granulation Amount: Medium (34-66%) Exposed Structure Granulation Quality: Pink Fascia Exposed: No Necrotic Amount: Medium (34-66%) Fat Layer (Subcutaneous Tissue) Exposed: Yes Necrotic Quality: Adherent Slough Tendon Exposed: No Muscle Exposed: No Joint Exposed: No Bone Exposed: No Treatment Notes Wound #32 (Foot) Wound Laterality: Dorsal, Left, Distal Cleanser Wound Cleanser Discharge Instruction: Cleanse the wound with wound cleanser prior to applying a clean dressing using gauze sponges, not tissue or cotton balls. Soap and Water Discharge Instruction: May shower and wash wound with dial antibacterial soap and water prior to dressing change. Peri-Wound Care Triamcinolone 15 (g) Discharge Instruction: Use triamcinolone 15 (g) as directed Sween Lotion (Moisturizing lotion) Discharge Instruction: Apply moisturizing lotion as directed Topical Primary Dressing KerraCel Ag Gelling Fiber Dressing, 4x5 in (silver alginate) Discharge Instruction: Apply silver alginate to wound bed as instructed Secondary Dressing Woven Gauze Sponge, Non-Sterile 4x4 in Discharge Instruction: Apply over primary dressing as directed. ABD Pad, 5x9 Discharge Instruction: Apply over primary dressing as directed. Secured With Compression Wrap ThreePress (3 layer compression wrap) Discharge Instruction: Apply three layer compression as directed. Compression Stockings Add-Ons Electronic Signature(s) Signed: 07/19/2020 5:12:57 PM By: Lorrin Jackson Signed: 07/20/2020 9:05:34 AM By:  Sandre Kitty Entered By: Sandre Kitty on 07/19/2020 16:55:19 -------------------------------------------------------------------------------- Wound Assessment Details Patient Name: Date of Service: [name] Foley F. 07/19/2020 9:30 A M Medical Record Number: [id number] Patient Account Number: [id number] Date of Birth/Sex: Treating RN: 1945-09-16 (75 y.o. Marcheta Grammes Primary Care Jaben Benegas: [name], [name] Other Clinician: Referring Lenoria Narine: Treating Casie Sturgeon/Extender: [name], [name] [name], [name] Weeks in Treatment: 222 Wound Status Wound Number: 33 Primary Diabetic Wound/Ulcer of the Lower Extremity Etiology: Wound Location: Right, Distal, Dorsal Foot Wound Healed - Epithelialized Wounding Event: Gradually Appeared Status: Date Acquired: 05/17/2020 Comorbid Anemia, Sleep Apnea, Arrhythmia, Congestive Heart Failure, Weeks Of Treatment: 9 History: Coronary Artery Disease, Hypertension, Myocardial Infarction, Clustered Wound: No Type II Diabetes, Gout, Neuropathy Wound Measurements Length: (cm) Width: (cm) Depth: (cm) Area: (cm) Volume: (cm) 0 % Reduction in Area: 100% 0 % Reduction in Volume: 100% 0 0 0 Wound Description Classification: Grade 1 Electronic Signature(s) Signed: 07/19/2020 5:12:57 PM By: Lorrin Jackson Entered By : Lorrin Jackson on 07/19/2020 10:23:27 -------------------------------------------------------------------------------- Wound Assessment Details Patient Name: Date of Service: [name] Foley F. 07/19/2020 9:30 A M Medical Record Number: [id number] Patient Account Number: [id number] Date of Birth/Sex: Treating RN: February 21, 1946 (75 y.o. Marcheta Grammes Primary Care Maylani Embree:  Rudean Curt, Destiny Entered By: Sandre Kitty on 07/19/2020 16:54:33 -------------------------------------------------------------------------------- Wound Assessment Details Patient Name: Date of Service: [name] [name] 07/19/2020 9:30 A M Medical Record Number: [id number] Patient Account Number: [id number] Date of Birth/Sex: Treating RN: Aug 08, 1945 (75 y.o. Marcheta Grammes Primary Care Emori Kamau: [name], [name] Other Clinician: Referring Breyah Akhter: Treating Sinai Mahany/Extender: [name], [name] [name], [name] Weeks in Treatment: 222 Wound Status Wound Number: 32 Primary Diabetic Wound/Ulcer of the Lower Extremity Etiology: Wound Location: Left, Distal, Dorsal Foot Wound Open Wounding Event: Gradually Appeared Status: Date Acquired: 05/03/2020 Comorbid Anemia, Sleep Apnea, Arrhythmia, Congestive Heart Failure, Weeks Of Treatment: 11 History: Coronary Artery Disease, Hypertension, Myocardial Infarction, Clustered Wound: No Type II Diabetes, Gout, Neuropathy Photos Wound Measurements Length:  (cm) 1.5 Width: (cm) 1 Depth: (cm) 0.3 Area: (cm) 1.178 Volume: (cm) 0.353 % Reduction in Area: 32.1% % Reduction in Volume: -102.9% Epithelialization: Small (1-33%) Tunneling: No Undermining: No Wound Description Classification: Grade 2 Wound Margin: Well defined, not attached Exudate Amount: Medium Exudate Type: Serosanguineous Exudate Color: red, brown Foul Odor After Cleansing: No Slough/Fibrino Yes Wound Bed Granulation Amount: Medium (34-66%) Exposed Structure Granulation Quality: Pink Fascia Exposed: No Necrotic Amount: Medium (34-66%) Fat Layer (Subcutaneous Tissue) Exposed: Yes Necrotic Quality: Adherent Slough Tendon Exposed: No Muscle Exposed: No Joint Exposed: No Bone Exposed: No Treatment Notes Wound #32 (Foot) Wound Laterality: Dorsal, Left, Distal Cleanser Wound Cleanser Discharge Instruction: Cleanse the wound with wound cleanser prior to applying a clean dressing using gauze sponges, not tissue or cotton balls. Soap and Water Discharge Instruction: May shower and wash wound with dial antibacterial soap and water prior to dressing change. Peri-Wound Care Triamcinolone 15 (g) Discharge Instruction: Use triamcinolone 15 (g) as directed Sween Lotion (Moisturizing lotion) Discharge Instruction: Apply moisturizing lotion as directed Topical Primary Dressing KerraCel Ag Gelling Fiber Dressing, 4x5 in (silver alginate) Discharge Instruction: Apply silver alginate to wound bed as instructed Secondary Dressing Woven Gauze Sponge, Non-Sterile 4x4 in Discharge Instruction: Apply over primary dressing as directed. ABD Pad, 5x9 Discharge Instruction: Apply over primary dressing as directed. Secured With Compression Wrap ThreePress (3 layer compression wrap) Discharge Instruction: Apply three layer compression as directed. Compression Stockings Add-Ons Electronic Signature(s) Signed: 07/19/2020 5:12:57 PM By: Lorrin Jackson Signed: 07/20/2020 9:05:34 AM By:  Sandre Kitty Entered By: Sandre Kitty on 07/19/2020 16:55:19 -------------------------------------------------------------------------------- Wound Assessment Details Patient Name: Date of Service: [name] Foley F. 07/19/2020 9:30 A M Medical Record Number: [id number] Patient Account Number: [id number] Date of Birth/Sex: Treating RN: 1945-09-16 (75 y.o. Marcheta Grammes Primary Care Jaben Benegas: [name], [name] Other Clinician: Referring Lenoria Narine: Treating Casie Sturgeon/Extender: [name], [name] [name], [name] Weeks in Treatment: 222 Wound Status Wound Number: 33 Primary Diabetic Wound/Ulcer of the Lower Extremity Etiology: Wound Location: Right, Distal, Dorsal Foot Wound Healed - Epithelialized Wounding Event: Gradually Appeared Status: Date Acquired: 05/17/2020 Comorbid Anemia, Sleep Apnea, Arrhythmia, Congestive Heart Failure, Weeks Of Treatment: 9 History: Coronary Artery Disease, Hypertension, Myocardial Infarction, Clustered Wound: No Type II Diabetes, Gout, Neuropathy Wound Measurements Length: (cm) Width: (cm) Depth: (cm) Area: (cm) Volume: (cm) 0 % Reduction in Area: 100% 0 % Reduction in Volume: 100% 0 0 0 Wound Description Classification: Grade 1 Electronic Signature(s) Signed: 07/19/2020 5:12:57 PM By: Lorrin Jackson Entered By : Lorrin Jackson on 07/19/2020 10:23:27 -------------------------------------------------------------------------------- Wound Assessment Details Patient Name: Date of Service: [name] Foley F. 07/19/2020 9:30 A M Medical Record Number: [id number] Patient Account Number: [id number] Date of Birth/Sex: Treating RN: February 21, 1946 (75 y.o. Marcheta Grammes Primary Care Maylani Embree:  Joint: No N/A Small (1-33%) Small (1-33%) Epithelialization: N/A N/A Debridement - Excisional Debridement: N/A N/A 10:56 Pre-procedure Verification/Time Out Taken: N/A N/A Lidocaine Pain Control: N/A N/A Subcutaneous, Slough Tissue Debrided: N/A N/A Skin/Subcutaneous Tissue Level: N/A N/A 2.1 Debridement A (sq cm): rea N/A N/A Blade, Curette, Forceps Instrument: N/A N/A  Minimum Bleeding: N/A N/A Pressure Hemostasis A chieved: N/A N/A 0 Procedural Pain: N/A N/A 0 Post Procedural Pain: N/A N/A Procedure was tolerated well Debridement Treatment Response: N/A N/A 1.4x1.5x0.2 Post Debridement Measurements L x W x D (cm) N/A N/A 0.33 Post Debridement Volume: (cm) N/A N/A Debridement Procedures Performed: Treatment Notes Electronic Signature(s) Signed: 07/19/2020 5:10:40 PM By: [name] Signed: 07/21/2020 7:47:56 AM By: Linton Ham MD Entered By: Linton Ham on 07/19/2020 11:03:05 -------------------------------------------------------------------------------- Multi-Disciplinary Care Plan Details Patient Name: Date of Service: [name] Foley F. 07/19/2020 9:30 A M Medical Record Number: [id number] Patient Account Number: [id number] Date of Birth/Sex: Treating RN: [birth year] (75 y.o. [name], King Primary Care Tylisha Danis: [name], [name] Other Clinician: Referring Tyger Wichman: Treating Verma Grothaus/Extender: [name], [name] [name], [name] Weeks in Treatment: Homer reviewed with physician Active Inactive Wound/Skin Impairment Nursing Diagnoses: Impaired tissue integrity Goals: Patient/caregiver will verbalize understanding of skin care regimen Date Initiated: 11/19/2017 Target Resolution Date: 07/23/2020 Goal Status: Active Ulcer/skin breakdown will have a volume reduction of 30% by week 4 Date Initiated: 11/19/2017 Date Inactivated: 12/02/2017 Target Resolution Date: 12/22/2017 Goal Status: Unmet Unmet Reason: multipl comorbidities Interventions: Assess patient/caregiver ability to obtain necessary supplies Assess ulceration(s) every visit Provide education on ulcer and skin care Notes: Electronic Signature(s) Signed: 07/19/2020 5:10:40 PM By: [name] Entered By: [name] on 07/19/2020  09:45:33 -------------------------------------------------------------------------------- Pain Assessment Details Patient Name: Date of Service: [name] Foley F. 07/19/2020 9:30 A M Medical Record Number: [id number] Patient Account Number: [id number] Date of Birth/Sex: Treating RN: [birth year] (75 y.o. Marcheta Grammes Primary Care Cashlynn Yearwood: [name], [name] Other Clinician: Referring Ryana Montecalvo: Treating Ellena Kamen/Extender: [name], [name] [name], [name] Weeks in Treatment: 222 Active Problems Location of Pain Severity and Description of Pain Patient Has Paino No Site Locations Pain Management and Medication Current Pain Management: Electronic Signature(s) Signed: 07/19/2020 5:12:57 PM By: Lorrin Jackson Entered By: Lorrin Jackson on 07/19/2020 10:01:17 -------------------------------------------------------------------------------- Patient/Caregiver Education Details Patient Name: Date of Service: [name] [name] 3/29/2022andnbsp9:30 A M Medical Record Number: [id number] Patient Account Number: [id number] Date of Birth/Gender: Treating RN: Dec 13, 1945 (74 y.o. [name] Primary Care Physician: [name], [name] Other Clinician: Referring Physician: Treating Physician/Extender: [name], [name] [name], [name] Weeks in Treatment: 222 Education Assessment Education Provided To: Patient Education Topics Provided Wound/Skin Impairment: Methods: Explain/Verbal Responses: State content correctly Electronic Signature(s) Signed: 07/19/2020 5:10:40 PM By: [name] Entered By: [name] on 07/19/2020 09:50:08 -------------------------------------------------------------------------------- Wound Assessment Details Patient Name: Date of Service: [name] Foley F. 07/19/2020 9:30 A M Medical Record Number: [id number] Patient Account Number: [id number] Date of Birth/Sex: Treating RN: 1945/10/13 (75 y.o. Marcheta Grammes Primary Care Halle Davlin: [name],  [name] Other Clinician: Referring Marcheta Horsey: Treating Meliyah Simon/Extender: [name], [name] [name], [name] Weeks in Treatment: 222 Wound Status Wound Number: 14 Primary Diabetic Wound/Ulcer of the Lower Extremity Etiology: Wound Location: Left, Lateral Lower Leg Wound Open Wounding Event: Gradually Appeared Status: Date Acquired: 08/15/2017 Comorbid Anemia, Sleep Apnea, Arrhythmia, Congestive Heart Failure, Weeks Of Treatment: 152 History: Coronary Artery Disease, Hypertension, Myocardial Infarction, Clustered Wound: Yes Type II Diabetes, Gout, Neuropathy Photos Wound Measurements Length: (cm) 10 Width: (cm) 0.6 Depth: (cm) 0.2  CONWAY, MOYNIHAN ([id number]) Visit Report for 07/19/2020 Arrival Information Details Patient Name: Date of Service: [name] [name] [facility] F. 07/19/2020 9:30 A M Medical Record Number: [id number] Patient Account Number: [id number] Date of Birth/Sex: Treating RN: 11-Apr-1946 (75 y.o. Marcheta Grammes Primary Care Tyrease Vandeberg: [name], [name] Other Clinician: Referring Derrik Mceachern: Treating Jack Mineau/Extender: [name], [name] [name], [name] Weeks in Treatment: 222 Visit Information History Since Last Visit All ordered tests and consults were completed: Yes Patient Arrived: Wheel Chair Added or deleted any medications: No Arrival Time: 09:53 Any new allergies or adverse reactions: No Accompanied By: Daughter Had a fall or experienced change in No Transfer Assistance: Manual activities of daily living that may affect Patient Identification Verified: Yes risk of falls: Secondary Verification Process Completed: Yes Signs or symptoms of abuse/neglect since last visito No Patient Requires Transmission-Based Precautions: No Hospitalized since last visit: No Patient Has Alerts: Yes Implantable device outside of the clinic excluding No Patient Alerts: Patient on Blood Thinner cellular tissue based products placed in the center left ABI 1.0; rt ABI 1.09 since last visit: Has Dressing in Place as Prescribed: Yes Has Compression in Place as Prescribed: Yes Pain Present Now: No Electronic Signature(s) Signed: 07/19/2020 5:12:57 PM By: Lorrin Jackson Entered By: Lorrin Jackson on 07/19/2020 09:57:55 -------------------------------------------------------------------------------- Compression Therapy Details Patient Name: Date of Service: [name] Foley F. 07/19/2020 9:30 A M Medical Record Number: [id number] Patient Account Number: [id number] Date of Birth/Sex: Treating RN: 1945/12/18 (75 y.o. [name] Primary Care Frandy Basnett: [name], [name] Other Clinician: Referring Evett Kassa: Treating  Allyce Bochicchio/Extender: [name], [name] [name], [name] Weeks in Treatment: 222 Compression Therapy Performed for Wound Assessment: Wound #14 Left,Lateral Lower Leg Performed By: Clinician [name], RN Compression Type: Three Layer Post Procedure Diagnosis Same as Pre-procedure Electronic Signature(s) Signed: 07/19/2020 5:10:40 PM By: [name] Entered By: [name] on 07/19/2020 11:03:21 -------------------------------------------------------------------------------- Compression Therapy Details Patient Name: Date of Service: [name] Foley F. 07/19/2020 9:30 A M Medical Record Number: [id number] Patient Account Number: [id number] Date of Birth/Sex: Treating RN: 1945-06-06 (75 y.o. [name] Primary Care Braycen Burandt: [name], [name] Other Clinician: Referring Emmaclaire Switala: Treating Charma Mocarski/Extender: [name], [name] [name], [name] Weeks in Treatment: 222 Compression Therapy Performed for Wound Assessment: Wound #25 Right,Lateral Lower Leg Performed By: Clinician [name], RN Compression Type: Three Layer Post Procedure Diagnosis Same as Pre-procedure Electronic Signature(s) Signed: 07/19/2020 5:10:40 PM By: [name] Entered By: [name] on 07/19/2020 11:03:21 -------------------------------------------------------------------------------- Compression Therapy Details Patient Name: Date of Service: [name] Foley F. 07/19/2020 9:30 A M Medical Record Number: [id number] Patient Account Number: [id number] Date of Birth/Sex: Treating RN: [birth year] (75 y.o. [name] Primary Care Johnetta Sloniker: [name], [name] Other Clinician: Referring Zera Markwardt: Treating Kurt Azimi/Extender: [name], [name] [name], [name] Weeks in Treatment: 222 Compression Therapy Performed for Wound Assessment: Wound #26 Left,Dorsal Foot Performed By: Clinician [name], RN Compression Type: Three Layer Post Procedure Diagnosis Same as  Pre-procedure Electronic Signature(s) Signed: 07/19/2020 5:10:40 PM By: [name] Entered By: [name] on 07/19/2020 11:03:21 -------------------------------------------------------------------------------- Compression Therapy Details Patient Name: Date of Service: [name] Foley F. 07/19/2020 9:30 A M Medical Record Number: [id number] Patient Account Number: [id number] Date of Birth/Sex: Treating RN: 12/06/1945 (75 y.o. [name] Primary Care Sharona Rovner: [name], [name] Other Clinician: Referring Donnamae Muilenburg: Treating Nyazia Canevari/Extender: [name], [name] [name], [name] Weeks in Treatment: 222 Compression Therapy Performed for Wound Assessment: Wound #28 Right,Dorsal Foot Performed By: Clinician [name], RN Compression Type: Three Layer Post Procedure Diagnosis Same as  Rudean Curt, Destiny Entered By: Sandre Kitty on 07/19/2020 16:54:33 -------------------------------------------------------------------------------- Wound Assessment Details Patient Name: Date of Service: [name] [name] 07/19/2020 9:30 A M Medical Record Number: [id number] Patient Account Number: [id number] Date of Birth/Sex: Treating RN: Aug 08, 1945 (75 y.o. Marcheta Grammes Primary Care Emori Kamau: [name], [name] Other Clinician: Referring Breyah Akhter: Treating Sinai Mahany/Extender: [name], [name] [name], [name] Weeks in Treatment: 222 Wound Status Wound Number: 32 Primary Diabetic Wound/Ulcer of the Lower Extremity Etiology: Wound Location: Left, Distal, Dorsal Foot Wound Open Wounding Event: Gradually Appeared Status: Date Acquired: 05/03/2020 Comorbid Anemia, Sleep Apnea, Arrhythmia, Congestive Heart Failure, Weeks Of Treatment: 11 History: Coronary Artery Disease, Hypertension, Myocardial Infarction, Clustered Wound: No Type II Diabetes, Gout, Neuropathy Photos Wound Measurements Length:  (cm) 1.5 Width: (cm) 1 Depth: (cm) 0.3 Area: (cm) 1.178 Volume: (cm) 0.353 % Reduction in Area: 32.1% % Reduction in Volume: -102.9% Epithelialization: Small (1-33%) Tunneling: No Undermining: No Wound Description Classification: Grade 2 Wound Margin: Well defined, not attached Exudate Amount: Medium Exudate Type: Serosanguineous Exudate Color: red, brown Foul Odor After Cleansing: No Slough/Fibrino Yes Wound Bed Granulation Amount: Medium (34-66%) Exposed Structure Granulation Quality: Pink Fascia Exposed: No Necrotic Amount: Medium (34-66%) Fat Layer (Subcutaneous Tissue) Exposed: Yes Necrotic Quality: Adherent Slough Tendon Exposed: No Muscle Exposed: No Joint Exposed: No Bone Exposed: No Treatment Notes Wound #32 (Foot) Wound Laterality: Dorsal, Left, Distal Cleanser Wound Cleanser Discharge Instruction: Cleanse the wound with wound cleanser prior to applying a clean dressing using gauze sponges, not tissue or cotton balls. Soap and Water Discharge Instruction: May shower and wash wound with dial antibacterial soap and water prior to dressing change. Peri-Wound Care Triamcinolone 15 (g) Discharge Instruction: Use triamcinolone 15 (g) as directed Sween Lotion (Moisturizing lotion) Discharge Instruction: Apply moisturizing lotion as directed Topical Primary Dressing KerraCel Ag Gelling Fiber Dressing, 4x5 in (silver alginate) Discharge Instruction: Apply silver alginate to wound bed as instructed Secondary Dressing Woven Gauze Sponge, Non-Sterile 4x4 in Discharge Instruction: Apply over primary dressing as directed. ABD Pad, 5x9 Discharge Instruction: Apply over primary dressing as directed. Secured With Compression Wrap ThreePress (3 layer compression wrap) Discharge Instruction: Apply three layer compression as directed. Compression Stockings Add-Ons Electronic Signature(s) Signed: 07/19/2020 5:12:57 PM By: Lorrin Jackson Signed: 07/20/2020 9:05:34 AM By:  Sandre Kitty Entered By: Sandre Kitty on 07/19/2020 16:55:19 -------------------------------------------------------------------------------- Wound Assessment Details Patient Name: Date of Service: [name] Foley F. 07/19/2020 9:30 A M Medical Record Number: [id number] Patient Account Number: [id number] Date of Birth/Sex: Treating RN: 1945-09-16 (75 y.o. Marcheta Grammes Primary Care Jaben Benegas: [name], [name] Other Clinician: Referring Lenoria Narine: Treating Casie Sturgeon/Extender: [name], [name] [name], [name] Weeks in Treatment: 222 Wound Status Wound Number: 33 Primary Diabetic Wound/Ulcer of the Lower Extremity Etiology: Wound Location: Right, Distal, Dorsal Foot Wound Healed - Epithelialized Wounding Event: Gradually Appeared Status: Date Acquired: 05/17/2020 Comorbid Anemia, Sleep Apnea, Arrhythmia, Congestive Heart Failure, Weeks Of Treatment: 9 History: Coronary Artery Disease, Hypertension, Myocardial Infarction, Clustered Wound: No Type II Diabetes, Gout, Neuropathy Wound Measurements Length: (cm) Width: (cm) Depth: (cm) Area: (cm) Volume: (cm) 0 % Reduction in Area: 100% 0 % Reduction in Volume: 100% 0 0 0 Wound Description Classification: Grade 1 Electronic Signature(s) Signed: 07/19/2020 5:12:57 PM By: Lorrin Jackson Entered By : Lorrin Jackson on 07/19/2020 10:23:27 -------------------------------------------------------------------------------- Wound Assessment Details Patient Name: Date of Service: [name] Foley F. 07/19/2020 9:30 A M Medical Record Number: [id number] Patient Account Number: [id number] Date of Birth/Sex: Treating RN: February 21, 1946 (75 y.o. Marcheta Grammes Primary Care Maylani Embree:  Joint: No N/A Small (1-33%) Small (1-33%) Epithelialization: N/A N/A Debridement - Excisional Debridement: N/A N/A 10:56 Pre-procedure Verification/Time Out Taken: N/A N/A Lidocaine Pain Control: N/A N/A Subcutaneous, Slough Tissue Debrided: N/A N/A Skin/Subcutaneous Tissue Level: N/A N/A 2.1 Debridement A (sq cm): rea N/A N/A Blade, Curette, Forceps Instrument: N/A N/A  Minimum Bleeding: N/A N/A Pressure Hemostasis A chieved: N/A N/A 0 Procedural Pain: N/A N/A 0 Post Procedural Pain: N/A N/A Procedure was tolerated well Debridement Treatment Response: N/A N/A 1.4x1.5x0.2 Post Debridement Measurements L x W x D (cm) N/A N/A 0.33 Post Debridement Volume: (cm) N/A N/A Debridement Procedures Performed: Treatment Notes Electronic Signature(s) Signed: 07/19/2020 5:10:40 PM By: [name] Signed: 07/21/2020 7:47:56 AM By: Linton Ham MD Entered By: Linton Ham on 07/19/2020 11:03:05 -------------------------------------------------------------------------------- Multi-Disciplinary Care Plan Details Patient Name: Date of Service: [name] Foley F. 07/19/2020 9:30 A M Medical Record Number: [id number] Patient Account Number: [id number] Date of Birth/Sex: Treating RN: [birth year] (75 y.o. [name], [place] Primary Care Tylisha Danis: [name], [name] Other Clinician: Referring Tyger Wichman: Treating Verma Grothaus/Extender: [name], [name] [name], [name] Weeks in Treatment: Homer reviewed with physician Active Inactive Wound/Skin Impairment Nursing Diagnoses: Impaired tissue integrity Goals: Patient/caregiver will verbalize understanding of skin care regimen Date Initiated: 11/19/2017 Target Resolution Date: 07/23/2020 Goal Status: Active Ulcer/skin breakdown will have a volume reduction of 30% by week 4 Date Initiated: 11/19/2017 Date Inactivated: 12/02/2017 Target Resolution Date: 12/22/2017 Goal Status: Unmet Unmet Reason: multipl comorbidities Interventions: Assess patient/caregiver ability to obtain necessary supplies Assess ulceration(s) every visit Provide education on ulcer and skin care Notes: Electronic Signature(s) Signed: 07/19/2020 5:10:40 PM By: [name] Entered By: [name] on 07/19/2020  09:45:33 -------------------------------------------------------------------------------- Pain Assessment Details Patient Name: Date of Service: [name] Foley F. 07/19/2020 9:30 A M Medical Record Number: [id number] Patient Account Number: [id number] Date of Birth/Sex: Treating RN: [birth year] (75 y.o. Marcheta Grammes Primary Care Cashlynn Yearwood: [name], [name] Other Clinician: Referring Ryana Montecalvo: Treating Ellena Kamen/Extender: [name], [name] [name], [name] Weeks in Treatment: 222 Active Problems Location of Pain Severity and Description of Pain Patient Has Paino No Site Locations Pain Management and Medication Current Pain Management: Electronic Signature(s) Signed: 07/19/2020 5:12:57 PM By: Lorrin Jackson Entered By: Lorrin Jackson on 07/19/2020 10:01:17 -------------------------------------------------------------------------------- Patient/Caregiver Education Details Patient Name: Date of Service: [name] [name] 3/29/2022andnbsp9:30 A M Medical Record Number: [id number] Patient Account Number: [id number] Date of Birth/Gender: Treating RN: Dec 13, 1945 (74 y.o. [name] Primary Care Physician: [name], [name] Other Clinician: Referring Physician: Treating Physician/Extender: [name], [name] [name], [name] Weeks in Treatment: 222 Education Assessment Education Provided To: Patient Education Topics Provided Wound/Skin Impairment: Methods: Explain/Verbal Responses: State content correctly Electronic Signature(s) Signed: 07/19/2020 5:10:40 PM By: [name] Entered By: [name] on 07/19/2020 09:50:08 -------------------------------------------------------------------------------- Wound Assessment Details Patient Name: Date of Service: [name] Foley F. 07/19/2020 9:30 A M Medical Record Number: [id number] Patient Account Number: [id number] Date of Birth/Sex: Treating RN: 1945/10/13 (75 y.o. Marcheta Grammes Primary Care Halle Davlin: [name],  [name] Other Clinician: Referring Marcheta Horsey: Treating Meliyah Simon/Extender: [name], [name] [name], [name] Weeks in Treatment: 222 Wound Status Wound Number: 14 Primary Diabetic Wound/Ulcer of the Lower Extremity Etiology: Wound Location: Left, Lateral Lower Leg Wound Open Wounding Event: Gradually Appeared Status: Date Acquired: 08/15/2017 Comorbid Anemia, Sleep Apnea, Arrhythmia, Congestive Heart Failure, Weeks Of Treatment: 152 History: Coronary Artery Disease, Hypertension, Myocardial Infarction, Clustered Wound: Yes Type II Diabetes, Gout, Neuropathy Photos Wound Measurements Length: (cm) 10 Width: (cm) 0.6 Depth: (cm) 0.2  Joint: No N/A Small (1-33%) Small (1-33%) Epithelialization: N/A N/A Debridement - Excisional Debridement: N/A N/A 10:56 Pre-procedure Verification/Time Out Taken: N/A N/A Lidocaine Pain Control: N/A N/A Subcutaneous, Slough Tissue Debrided: N/A N/A Skin/Subcutaneous Tissue Level: N/A N/A 2.1 Debridement A (sq cm): rea N/A N/A Blade, Curette, Forceps Instrument: N/A N/A  Minimum Bleeding: N/A N/A Pressure Hemostasis A chieved: N/A N/A 0 Procedural Pain: N/A N/A 0 Post Procedural Pain: N/A N/A Procedure was tolerated well Debridement Treatment Response: N/A N/A 1.4x1.5x0.2 Post Debridement Measurements L x W x D (cm) N/A N/A 0.33 Post Debridement Volume: (cm) N/A N/A Debridement Procedures Performed: Treatment Notes Electronic Signature(s) Signed: 07/19/2020 5:10:40 PM By: [name] Signed: 07/21/2020 7:47:56 AM By: Linton Ham MD Entered By: Linton Ham on 07/19/2020 11:03:05 -------------------------------------------------------------------------------- Multi-Disciplinary Care Plan Details Patient Name: Date of Service: [name] Foley F. 07/19/2020 9:30 A M Medical Record Number: [id number] Patient Account Number: [id number] Date of Birth/Sex: Treating RN: [birth year] (75 y.o. [name], [place] Primary Care Tylisha Danis: [name], [name] Other Clinician: Referring Tyger Wichman: Treating Verma Grothaus/Extender: [name], [name] [name], [name] Weeks in Treatment: Homer reviewed with physician Active Inactive Wound/Skin Impairment Nursing Diagnoses: Impaired tissue integrity Goals: Patient/caregiver will verbalize understanding of skin care regimen Date Initiated: 11/19/2017 Target Resolution Date: 07/23/2020 Goal Status: Active Ulcer/skin breakdown will have a volume reduction of 30% by week 4 Date Initiated: 11/19/2017 Date Inactivated: 12/02/2017 Target Resolution Date: 12/22/2017 Goal Status: Unmet Unmet Reason: multipl comorbidities Interventions: Assess patient/caregiver ability to obtain necessary supplies Assess ulceration(s) every visit Provide education on ulcer and skin care Notes: Electronic Signature(s) Signed: 07/19/2020 5:10:40 PM By: [name] Entered By: [name] on 07/19/2020  09:45:33 -------------------------------------------------------------------------------- Pain Assessment Details Patient Name: Date of Service: [name] Foley F. 07/19/2020 9:30 A M Medical Record Number: [id number] Patient Account Number: [id number] Date of Birth/Sex: Treating RN: [birth year] (75 y.o. Marcheta Grammes Primary Care Cashlynn Yearwood: [name], [name] Other Clinician: Referring Ryana Montecalvo: Treating Ellena Kamen/Extender: [name], [name] [name], [name] Weeks in Treatment: 222 Active Problems Location of Pain Severity and Description of Pain Patient Has Paino No Site Locations Pain Management and Medication Current Pain Management: Electronic Signature(s) Signed: 07/19/2020 5:12:57 PM By: Lorrin Jackson Entered By: Lorrin Jackson on 07/19/2020 10:01:17 -------------------------------------------------------------------------------- Patient/Caregiver Education Details Patient Name: Date of Service: [name] [name] 3/29/2022andnbsp9:30 A M Medical Record Number: [id number] Patient Account Number: [id number] Date of Birth/Gender: Treating RN: Dec 13, 1945 (74 y.o. [name] Primary Care Physician: [name], [name] Other Clinician: Referring Physician: Treating Physician/Extender: [name], [name] [name], [name] Weeks in Treatment: 222 Education Assessment Education Provided To: Patient Education Topics Provided Wound/Skin Impairment: Methods: Explain/Verbal Responses: State content correctly Electronic Signature(s) Signed: 07/19/2020 5:10:40 PM By: [name] Entered By: [name] on 07/19/2020 09:50:08 -------------------------------------------------------------------------------- Wound Assessment Details Patient Name: Date of Service: [name] Foley F. 07/19/2020 9:30 A M Medical Record Number: [id number] Patient Account Number: [id number] Date of Birth/Sex: Treating RN: 1945/10/13 (75 y.o. Marcheta Grammes Primary Care Halle Davlin: [name],  [name] Other Clinician: Referring Marcheta Horsey: Treating Meliyah Simon/Extender: [name], [name] [name], [name] Weeks in Treatment: 222 Wound Status Wound Number: 14 Primary Diabetic Wound/Ulcer of the Lower Extremity Etiology: Wound Location: Left, Lateral Lower Leg Wound Open Wounding Event: Gradually Appeared Status: Date Acquired: 08/15/2017 Comorbid Anemia, Sleep Apnea, Arrhythmia, Congestive Heart Failure, Weeks Of Treatment: 152 History: Coronary Artery Disease, Hypertension, Myocardial Infarction, Clustered Wound: Yes Type II Diabetes, Gout, Neuropathy Photos Wound Measurements Length: (cm) 10 Width: (cm) 0.6 Depth: (cm) 0.2  Right: Yes] Edema: [Left: Yes] [Right: No] Calf Left: Right: Point of Measurement: 36 cm From Medial Instep 24 cm 24 cm Ankle Left: Right: Point of Measurement: 11 cm From Medial Instep 19 cm 19.4 cm Vascular Assessment Pulses: Dorsalis Pedis Palpable: [Left:Yes] [Right:Yes] Electronic Signature(s) Signed: 07/19/2020 5:12:57 PM By: Lorrin Jackson Entered By: Lorrin Jackson on 07/19/2020 10:12:06 -------------------------------------------------------------------------------- Multi Wound Chart Details Patient Name: Date of Service: [name] Foley F. 07/19/2020 9:30 A M Medical Record Number: [id number] Patient Account Number: [id number] Date of Birth/Sex: Treating RN: 1945/06/18 (75 y.o. [name], [place] Primary Care Nancyjo Givhan: [name], [name] Other Clinician: Referring Henrique Parekh: Treating Tristram Milian/Extender: [name], [name] [name], [name] Weeks in Treatment: 222 Vital Signs Height(in): 74 Capillary Blood Glucose(mg/dl): 118 Weight(lbs): 150 Pulse(bpm): 18 Body Mass Index(BMI): 19 Blood Pressure(mmHg): 107/65 Temperature(F): 98.1 Respiratory Rate(breaths/min): 18 Photos: [14:No Photos Left, Lateral Lower Leg] [25:No Photos Right, Lateral Lower Leg] [26:No Photos Left, Dorsal Foot] Wound Location: [14:Gradually Appeared] [25:Gradually Appeared] [26:Gradually Appeared] Wounding Event: [14:Diabetic Wound/Ulcer of the Lower] [25:Venous Leg Ulcer] [26:Diabetic Wound/Ulcer of the Lower] Primary Etiology: [14:Extremity Anemia, Sleep Apnea, Arrhythmia,] [25:Anemia, Sleep Apnea, Arrhythmia,] [26:Extremity Anemia, Sleep Apnea, Arrhythmia,] Comorbid History: [14:Congestive Heart Failure, Coronary Artery Disease, Hypertension, Myocardial Infarction, Type II Diabetes, Gout, Neuropathy 08/15/2017] [25:Congestive Heart Failure, Coronary Artery Disease, Hypertension, Myocardial Infarction, Type  II Diabetes, Gout,  Neuropathy 07/06/2019] [26:Congestive Heart Failure, Coronary Artery Disease, Hypertension, Myocardial Infarction, Type II Diabetes, Gout, Neuropathy 08/18/2019] Date Acquired: [14:152] [25:52] [26:48] Weeks of Treatment: [14:Open] [25:Open] [26:Open] Wound Status: [14:Yes] [25:No] [26:Yes] Clustered Wound: [14:1] [25:N/A] [26:4] Clustered Quantity: [14:10x0.6x0.2] [25:1.5x0.5x0.1] [26:4x4x0.1] Measurements L x W x D (cm) [14:4.712] [25:0.589] [26:12.566] A (cm) : rea [14:0.942] [25:0.059] [26:1.257] Volume (cm) : [14:-400.20%] [25:78.40%] [26:-2184.70%] % Reduction in A [14:rea: -902.10%] [25:78.40%] [26:-2185.50%] % Reduction in Volume: [14:Grade 2] [25:Full Thickness Without Exposed] [26:Grade 2] Classification: [14:Medium] [25:Support Structures Medium] [26:Medium] Exudate A mount: [14:Serosanguineous] [25:Serosanguineous] [26:Purulent] Exudate Type: [14:red, brown] [25:red, brown] [26:yellow, brown, green] Exudate Color: [14:Distinct, outline attached] [25:Distinct, outline attached] [26:Distinct, outline attached] Wound Margin: [14:Large (67-100%)] [25:Large (67-100%)] [26:Medium (34-66%)] Granulation A mount: [14:Red] [25:Red] [26:Red, Pink] Granulation Quality: [14:Small (1-33%)] [25:Small (1-33%)] [26:Medium (34-66%)] Necrotic A mount: [14:Eschar, Adherent Slough] [25:Adherent Slough] [26:Eschar, Adherent Slough] Necrotic Tissue: [14:Fat Layer (Subcutaneous Tissue): Yes Fat Layer (Subcutaneous Tissue): Yes Fat Layer (Subcutaneous Tissue): Yes] Exposed Structures: [14:Fascia: No Tendon: No Muscle: No Joint: No Bone: No Small (1-33%)] [25:Fascia: No Tendon: No Muscle: No Joint: No Bone: No Medium (34-66%)] [26:Tendon: Yes Fascia: No Muscle: No Joint: No Bone: No None] Epithelialization: [14:N/A] [25:N/A] [26:Debridement - Excisional] Debridement: Pre-procedure Verification/Time Out N/A [25:N/A] [26:10:55] Taken: [14:N/A] [25:N/A] [26:Lidocaine] Pain Control: [14:N/A] [25:N/A]  [26:Tendon, Subcutaneous, Slough] Tissue Debrided: [14:N/A] [25:N/A] [26:Skin/Subcutaneous Tissue/Muscle] Level: [14:N/A] [25:N/A] [26:16] Debridement A (sq cm): [14:rea N/A] [25:N/A] [26:Blade, Curette, Forceps] Instrument: [14:N/A] [25:N/A] [26:Minimum] Bleeding: [14:N/A] [25:N/A] [26:Pressure] Hemostasis A chieved: [14:N/A] [25:N/A] [26:0] Procedural Pain: [14:N/A] [25:N/A] [26:0] Post Procedural Pain: [14:N/A] [25:N/A] [26:Procedure was tolerated well] Debridement Treatment Response: [14:N/A] [25:N/A] [26:4x4x0.1] Post Debridement Measurements L x W x D (cm) [14:N/A] [25:N/A] [26:1.257] Post Debridement Volume: (cm) [14:N/A] [25:N/A] [26:Debridement] Wound Number: [id number] Photos: No Photos No Photos No Photos Right, Dorsal Foot Right Calcaneus Left, Distal, Dorsal Foot Wound Location: Gradually Appeared Gradually Appeared Gradually Appeared Wounding Event: Diabetic Wound/Ulcer of the Lower Diabetic Wound/Ulcer of the Lower Diabetic Wound/Ulcer of the Lower Primary Etiology: Extremity Extremity Extremity Anemia, Sleep Apnea, Arrhythmia, Anemia, Sleep Apnea, Arrhythmia, Anemia, Sleep Apnea, Arrhythmia, Comorbid History:  Right: Yes] Edema: [Left: Yes] [Right: No] Calf Left: Right: Point of Measurement: 36 cm From Medial Instep 24 cm 24 cm Ankle Left: Right: Point of Measurement: 11 cm From Medial Instep 19 cm 19.4 cm Vascular Assessment Pulses: Dorsalis Pedis Palpable: [Left:Yes] [Right:Yes] Electronic Signature(s) Signed: 07/19/2020 5:12:57 PM By: Lorrin Jackson Entered By: Lorrin Jackson on 07/19/2020 10:12:06 -------------------------------------------------------------------------------- Multi Wound Chart Details Patient Name: Date of Service: [name] Foley F. 07/19/2020 9:30 A M Medical Record Number: [id number] Patient Account Number: [id number] Date of Birth/Sex: Treating RN: 1945/06/18 (75 y.o. [name], [place] Primary Care Nancyjo Givhan: [name], [name] Other Clinician: Referring Henrique Parekh: Treating Tristram Milian/Extender: [name], [name] [name], [name] Weeks in Treatment: 222 Vital Signs Height(in): 74 Capillary Blood Glucose(mg/dl): 118 Weight(lbs): 150 Pulse(bpm): 18 Body Mass Index(BMI): 19 Blood Pressure(mmHg): 107/65 Temperature(F): 98.1 Respiratory Rate(breaths/min): 18 Photos: [14:No Photos Left, Lateral Lower Leg] [25:No Photos Right, Lateral Lower Leg] [26:No Photos Left, Dorsal Foot] Wound Location: [14:Gradually Appeared] [25:Gradually Appeared] [26:Gradually Appeared] Wounding Event: [14:Diabetic Wound/Ulcer of the Lower] [25:Venous Leg Ulcer] [26:Diabetic Wound/Ulcer of the Lower] Primary Etiology: [14:Extremity Anemia, Sleep Apnea, Arrhythmia,] [25:Anemia, Sleep Apnea, Arrhythmia,] [26:Extremity Anemia, Sleep Apnea, Arrhythmia,] Comorbid History: [14:Congestive Heart Failure, Coronary Artery Disease, Hypertension, Myocardial Infarction, Type II Diabetes, Gout, Neuropathy 08/15/2017] [25:Congestive Heart Failure, Coronary Artery Disease, Hypertension, Myocardial Infarction, Type  II Diabetes, Gout,  Neuropathy 07/06/2019] [26:Congestive Heart Failure, Coronary Artery Disease, Hypertension, Myocardial Infarction, Type II Diabetes, Gout, Neuropathy 08/18/2019] Date Acquired: [14:152] [25:52] [26:48] Weeks of Treatment: [14:Open] [25:Open] [26:Open] Wound Status: [14:Yes] [25:No] [26:Yes] Clustered Wound: [14:1] [25:N/A] [26:4] Clustered Quantity: [14:10x0.6x0.2] [25:1.5x0.5x0.1] [26:4x4x0.1] Measurements L x W x D (cm) [14:4.712] [25:0.589] [26:12.566] A (cm) : rea [14:0.942] [25:0.059] [26:1.257] Volume (cm) : [14:-400.20%] [25:78.40%] [26:-2184.70%] % Reduction in A [14:rea: -902.10%] [25:78.40%] [26:-2185.50%] % Reduction in Volume: [14:Grade 2] [25:Full Thickness Without Exposed] [26:Grade 2] Classification: [14:Medium] [25:Support Structures Medium] [26:Medium] Exudate A mount: [14:Serosanguineous] [25:Serosanguineous] [26:Purulent] Exudate Type: [14:red, brown] [25:red, brown] [26:yellow, brown, green] Exudate Color: [14:Distinct, outline attached] [25:Distinct, outline attached] [26:Distinct, outline attached] Wound Margin: [14:Large (67-100%)] [25:Large (67-100%)] [26:Medium (34-66%)] Granulation A mount: [14:Red] [25:Red] [26:Red, Pink] Granulation Quality: [14:Small (1-33%)] [25:Small (1-33%)] [26:Medium (34-66%)] Necrotic A mount: [14:Eschar, Adherent Slough] [25:Adherent Slough] [26:Eschar, Adherent Slough] Necrotic Tissue: [14:Fat Layer (Subcutaneous Tissue): Yes Fat Layer (Subcutaneous Tissue): Yes Fat Layer (Subcutaneous Tissue): Yes] Exposed Structures: [14:Fascia: No Tendon: No Muscle: No Joint: No Bone: No Small (1-33%)] [25:Fascia: No Tendon: No Muscle: No Joint: No Bone: No Medium (34-66%)] [26:Tendon: Yes Fascia: No Muscle: No Joint: No Bone: No None] Epithelialization: [14:N/A] [25:N/A] [26:Debridement - Excisional] Debridement: Pre-procedure Verification/Time Out N/A [25:N/A] [26:10:55] Taken: [14:N/A] [25:N/A] [26:Lidocaine] Pain Control: [14:N/A] [25:N/A]  [26:Tendon, Subcutaneous, Slough] Tissue Debrided: [14:N/A] [25:N/A] [26:Skin/Subcutaneous Tissue/Muscle] Level: [14:N/A] [25:N/A] [26:16] Debridement A (sq cm): [14:rea N/A] [25:N/A] [26:Blade, Curette, Forceps] Instrument: [14:N/A] [25:N/A] [26:Minimum] Bleeding: [14:N/A] [25:N/A] [26:Pressure] Hemostasis A chieved: [14:N/A] [25:N/A] [26:0] Procedural Pain: [14:N/A] [25:N/A] [26:0] Post Procedural Pain: [14:N/A] [25:N/A] [26:Procedure was tolerated well] Debridement Treatment Response: [14:N/A] [25:N/A] [26:4x4x0.1] Post Debridement Measurements L x W x D (cm) [14:N/A] [25:N/A] [26:1.257] Post Debridement Volume: (cm) [14:N/A] [25:N/A] [26:Debridement] Wound Number: [id number] Photos: No Photos No Photos No Photos Right, Dorsal Foot Right Calcaneus Left, Distal, Dorsal Foot Wound Location: Gradually Appeared Gradually Appeared Gradually Appeared Wounding Event: Diabetic Wound/Ulcer of the Lower Diabetic Wound/Ulcer of the Lower Diabetic Wound/Ulcer of the Lower Primary Etiology: Extremity Extremity Extremity Anemia, Sleep Apnea, Arrhythmia, Anemia, Sleep Apnea, Arrhythmia, Anemia, Sleep Apnea, Arrhythmia, Comorbid History:  Distinct, outline attached Exudate Amount: Medium Exudate Type: Serosanguineous Exudate Color: red, brown Foul Odor After Cleansing: No Slough/Fibrino No Wound Bed Granulation Amount: Large (67-100%) Exposed Structure Granulation Quality: Red, Pink Fascia Exposed: No Necrotic Amount: None Present (0%) Fat Layer (Subcutaneous Tissue) Exposed: Yes Tendon Exposed: No Muscle Exposed: No Joint Exposed: No Bone Exposed: No Treatment Notes Wound #28 (Foot) Wound Laterality: Dorsal, Right Cleanser Wound Cleanser Discharge Instruction: Cleanse the wound with wound cleanser prior to applying a clean dressing using gauze sponges, not tissue or cotton balls. Soap and Water Discharge Instruction: May shower and wash wound with dial antibacterial soap and water prior to dressing change. Peri-Wound Care Triamcinolone 15 (g) Discharge Instruction: Use triamcinolone 15 (g) as directed Sween Lotion (Moisturizing lotion) Discharge Instruction: Apply moisturizing lotion as directed Topical Primary Dressing KerraCel Ag Gelling Fiber Dressing, 4x5 in (silver alginate) Discharge Instruction: Apply silver alginate to wound bed as instructed Secondary Dressing Woven Gauze Sponge, Non-Sterile 4x4 in Discharge Instruction: Apply over primary dressing as directed. ABD Pad, 5x9 Discharge Instruction: Apply over primary dressing as directed. Secured With Compression Wrap ThreePress (3 layer compression wrap) Discharge Instruction: Apply three layer compression as directed. Compression Stockings Add-Ons Electronic Signature(s) Signed: 07/19/2020 5:12:57 PM By: Lorrin Jackson Signed: 07/20/2020 9:05:34 AM By: Sandre Kitty Entered By: Sandre Kitty on 07/19/2020  16:57:01 -------------------------------------------------------------------------------- Wound Assessment Details Patient Name: Date of Service: [name] Foley F. 07/19/2020 9:30 A M Medical Record Number: [id number] Patient Account Number: [id number] Date of Birth/Sex: Treating RN: [birth year] (75 y.o. Marcheta Grammes Primary Care Kendall Justo: [name], [name] Other Clinician: Referring Medard Decuir: Treating Dawanda Mapel/Extender: [name], [name] [name], [name] Weeks in Treatment: 222 Wound Status Wound Number: 29 Primary Diabetic Wound/Ulcer of the Lower Extremity Etiology: Wound Location: Right Calcaneus Wound Open Wounding Event: Gradually Appeared Status: Date Acquired: 04/26/2020 Comorbid Anemia, Sleep Apnea, Arrhythmia, Congestive Heart Failure, Weeks Of Treatment: 12 History: Coronary Artery Disease, Hypertension, Myocardial Infarction, Clustered Wound: No Type II Diabetes, Gout, Neuropathy Photos Wound Measurements Length: (cm) 0.2 Width: (cm) 0.1 Depth: (cm) 0.1 Area: (cm) 0.016 Volume: (cm) 0.002 % Reduction in Area: 99.1% % Reduction in Volume: 98.9% Epithelialization: Large (67-100%) Tunneling: No Undermining: No Wound Description Classification: Grade 1 Wound Margin: Flat and Intact Exudate Amount: Small Exudate Type: Serosanguineous Exudate Color: red, brown Foul Odor After Cleansing: No Slough/Fibrino No Wound Bed Granulation Amount: Large (67-100%) Exposed Structure Granulation Quality: Red, Pink Fascia Exposed: No Necrotic Amount: None Present (0%) Fat Layer (Subcutaneous Tissue) Exposed: No Tendon Exposed: No Muscle Exposed: No Joint Exposed: No Bone Exposed: No Treatment Notes Wound #29 (Calcaneus) Wound Laterality: Right Cleanser Wound Cleanser Discharge Instruction: Cleanse the wound with wound cleanser prior to applying a clean dressing using gauze sponges, not tissue or cotton balls. Soap and Water Discharge Instruction: May shower and wash  wound with dial antibacterial soap and water prior to dressing change. Peri-Wound Care Triamcinolone 15 (g) Discharge Instruction: Use triamcinolone 15 (g) as directed Sween Lotion (Moisturizing lotion) Discharge Instruction: Apply moisturizing lotion as directed Topical Primary Dressing KerraCel Ag Gelling Fiber Dressing, 4x5 in (silver alginate) Discharge Instruction: Apply silver alginate to wound bed as instructed Secondary Dressing Woven Gauze Sponge, Non-Sterile 4x4 in Discharge Instruction: Apply over primary dressing as directed. ABD Pad, 5x9 Discharge Instruction: Apply over primary dressing as directed. Secured With Compression Wrap ThreePress (3 layer compression wrap) Discharge Instruction: Apply three layer compression as directed. Compression Stockings Add-Ons Electronic Signature(s) Signed: 07/19/2020 5:12:57 PM By: Lorrin Jackson Signed: 07/20/2020 9:05:34 AM By:

## 2020-07-23 LAB — AEROBIC CULTURE W GRAM STAIN (SUPERFICIAL SPECIMEN)

## 2020-07-26 ENCOUNTER — Other Ambulatory Visit: Payer: Self-pay

## 2020-07-26 ENCOUNTER — Encounter (HOSPITAL_BASED_OUTPATIENT_CLINIC_OR_DEPARTMENT_OTHER): Payer: Medicare HMO | Attending: Internal Medicine | Admitting: Internal Medicine

## 2020-07-26 ENCOUNTER — Other Ambulatory Visit: Payer: Self-pay | Admitting: Internal Medicine

## 2020-07-26 ENCOUNTER — Ambulatory Visit: Payer: Medicare HMO | Admitting: Vascular Surgery

## 2020-07-26 ENCOUNTER — Other Ambulatory Visit (HOSPITAL_COMMUNITY): Payer: Self-pay | Admitting: Internal Medicine

## 2020-07-26 DIAGNOSIS — L97524 Non-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: Secondary | ICD-10-CM | POA: Diagnosis not present

## 2020-07-26 DIAGNOSIS — E11622 Type 2 diabetes mellitus with other skin ulcer: Secondary | ICD-10-CM | POA: Insufficient documentation

## 2020-07-26 DIAGNOSIS — L97521 Non-pressure chronic ulcer of other part of left foot limited to breakdown of skin: Secondary | ICD-10-CM | POA: Diagnosis not present

## 2020-07-26 DIAGNOSIS — L97518 Non-pressure chronic ulcer of other part of right foot with other specified severity: Secondary | ICD-10-CM | POA: Diagnosis not present

## 2020-07-26 DIAGNOSIS — L89613 Pressure ulcer of right heel, stage 3: Secondary | ICD-10-CM | POA: Insufficient documentation

## 2020-07-26 DIAGNOSIS — L97221 Non-pressure chronic ulcer of left calf limited to breakdown of skin: Secondary | ICD-10-CM | POA: Diagnosis not present

## 2020-07-26 DIAGNOSIS — E11621 Type 2 diabetes mellitus with foot ulcer: Secondary | ICD-10-CM | POA: Diagnosis not present

## 2020-07-26 DIAGNOSIS — L97529 Non-pressure chronic ulcer of other part of left foot with unspecified severity: Secondary | ICD-10-CM | POA: Diagnosis present

## 2020-07-26 DIAGNOSIS — L97321 Non-pressure chronic ulcer of left ankle limited to breakdown of skin: Secondary | ICD-10-CM | POA: Diagnosis not present

## 2020-07-26 DIAGNOSIS — L97512 Non-pressure chronic ulcer of other part of right foot with fat layer exposed: Secondary | ICD-10-CM | POA: Diagnosis not present

## 2020-07-26 DIAGNOSIS — L97522 Non-pressure chronic ulcer of other part of left foot with fat layer exposed: Secondary | ICD-10-CM | POA: Diagnosis not present

## 2020-07-26 DIAGNOSIS — I872 Venous insufficiency (chronic) (peripheral): Secondary | ICD-10-CM | POA: Diagnosis not present

## 2020-07-27 NOTE — Progress Notes (Signed)
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 [place], [place] [address] [place], [place] [address] [phone] Allergies Sulfa Provider's Orders Computed Tomography (CT) Scan - left foot; without contrast; ICD 10: CPT: Hand Signature: Date(s): Electronic Signature(s) Signed: 07/26/2020 4:57:40 PM By: Linton Ham MD Signed: 07/27/2020 5:28:50 PM By: [name] Entered By: [name] on 07/26/2020 15:08:53 -------------------------------------------------------------------------------- Problem List Details Patient Name: Date of Service: [name] Foley F. 07/26/2020 9:30 A M Medical Record Number: [id number] Patient Account Number: [id number] Date of Birth/Sex: Treating RN: 11/05/1945 (75 y.o. [name] [name], [name] [name] Primary Care Provider: [name], [name] Other Clinician: Referring Provider: Treating Provider/Extender: [name] [name] [name] [name] Weeks in Treatment: 223 Active Problems ICD-10 Encounter Code Description Active Date MDM Diagnosis E11.621 Type 2 diabetes mellitus with foot ulcer 04/17/2016 No Yes L97.518 Non-pressure chronic ulcer of other part of right foot with other specified 05/17/2020 No Yes severity L97.221 Non-pressure chronic ulcer of left calf limited to breakdown of skin 08/15/2017 No Yes L97.321 Non-pressure chronic ulcer of left ankle  limited to breakdown of skin 07/01/2018 No Yes L97.521 Non-pressure chronic ulcer of other part of left foot limited to breakdown of 02/17/2019 No Yes skin L97.524 Non-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 06/14/2020 No Yes Inactive Problems ICD-10 Code Description Active Date Inactive Date L03.032 Cellulitis of left toe 01/06/2019 01/06/2019 Y65.993 Pressure ulcer of left heel, stage 3 12/04/2016 12/04/2016 I25.119 Atherosclerotic heart disease of native coronary artery with unspecified angina 04/17/2016 04/17/2016 pectoris L97.821 Non-pressure chronic ulcer of other part of left lower leg limited to breakdown of skin 01/06/2019 01/06/2019 S51.811D Laceration without foreign body of right forearm, subsequent encounter 10/22/2017 10/22/2017 T70.17 Acute diastolic (congestive) heart failure 04/17/2016 04/17/2016 L03.116 Cellulitis of left lower limb 12/24/2017 12/24/2017 L89.620 Pressure ulcer of left heel, unstageable 07/21/2019 07/21/2019 L97.211 Non-pressure chronic ulcer of right calf limited to breakdown of skin 07/21/2019 07/21/2019 S80.211D Abrasion, right knee, subsequent encounter 08/18/2019 08/18/2019 L89.613 Pressure ulcer of right heel, stage 3 04/17/2016 04/17/2016 Resolved Problems ICD-10 Code Description Active Date Resolved Date L89.512 Pressure ulcer of right ankle, stage 2 04/17/2016 04/17/2016 L89.522 Pressure ulcer of left ankle, stage 2 04/17/2016 04/17/2016 Electronic Signature(s) Signed: 07/26/2020 4:57:40 PM By: Linton Ham MD Entered By: Linton Ham on 07/26/2020 11:06:55 -------------------------------------------------------------------------------- Progress Note Details Patient Name: Date of Service: [name] Foley F. 07/26/2020 9:30 A M Medical Record Number: [id number] Patient Account Number: [id number] Date of Birth/Sex: Treating RN: 1945-12-13 (75 y.o. [name] [name] Primary Care Provider: [name], [name] Other  Clinician: Referring Provider: Treating Provider/Extender:  Generic) Every Other Day/15 Days Discharge Instructions: Apply over primary dressing as directed. Secondary Dressing: ABD Pad, 5x9 Every Other Day/15 Days Discharge Instructions: Apply over primary dressing as directed. Compression Wrap: Kerlix Roll 4.5x3.1 (in/yd) (Generic) Every Other Day/15 Days Discharge Instructions: Apply Kerlix and Coban compression as directed. Compression Wrap: Coban Self-Adherent Wrap 4x5 (in/yd) (Generic) Every Other Day/15 Days Discharge Instructions: Apply over Kerlix as directed. Radiology Computed Tomography (CT) Scan - left foot; without contrast;  ICD 10: CPT: Electronic Signature(s) Signed: 07/26/2020 4:57:40 PM By: Linton Ham MD Signed: 07/27/2020 5:28:50 PM By: [name] Previous Signature: 07/26/2020 11:05:24 AM Version By: Linton Ham MD Entered By: [name] on 07/26/2020 15:08:50 Prescription 07/26/2020 -------------------------------------------------------------------------------- [name] Bishop MD Patient Name: Provider: [birth year] [id number] Date of Birth: NPI#: [name] [name] [id number] Sex: DEA #: [phone] Phone #: License #: [place] Patient Address: [facility] [address] [place], [place] [address] [place], [place] [address] [phone] Allergies Sulfa Provider's Orders Computed Tomography (CT) Scan - left foot; without contrast; ICD 10: CPT: Hand Signature: Date(s): Electronic Signature(s) Signed: 07/26/2020 4:57:40 PM By: Linton Ham MD Signed: 07/27/2020 5:28:50 PM By: [name] Entered By: [name] on 07/26/2020 15:08:53 -------------------------------------------------------------------------------- Problem List Details Patient Name: Date of Service: [name] Foley F. 07/26/2020 9:30 A M Medical Record Number: [id number] Patient Account Number: [id number] Date of Birth/Sex: Treating RN: 11/05/1945 (75 y.o. [name] [name], [name] [name] Primary Care Provider: [name], [name] Other Clinician: Referring Provider: Treating Provider/Extender: [name] [name] [name] [name] Weeks in Treatment: 223 Active Problems ICD-10 Encounter Code Description Active Date MDM Diagnosis E11.621 Type 2 diabetes mellitus with foot ulcer 04/17/2016 No Yes L97.518 Non-pressure chronic ulcer of other part of right foot with other specified 05/17/2020 No Yes severity L97.221 Non-pressure chronic ulcer of left calf limited to breakdown of skin 08/15/2017 No Yes L97.321 Non-pressure chronic ulcer of left ankle  limited to breakdown of skin 07/01/2018 No Yes L97.521 Non-pressure chronic ulcer of other part of left foot limited to breakdown of 02/17/2019 No Yes skin L97.524 Non-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 06/14/2020 No Yes Inactive Problems ICD-10 Code Description Active Date Inactive Date L03.032 Cellulitis of left toe 01/06/2019 01/06/2019 Y65.993 Pressure ulcer of left heel, stage 3 12/04/2016 12/04/2016 I25.119 Atherosclerotic heart disease of native coronary artery with unspecified angina 04/17/2016 04/17/2016 pectoris L97.821 Non-pressure chronic ulcer of other part of left lower leg limited to breakdown of skin 01/06/2019 01/06/2019 S51.811D Laceration without foreign body of right forearm, subsequent encounter 10/22/2017 10/22/2017 T70.17 Acute diastolic (congestive) heart failure 04/17/2016 04/17/2016 L03.116 Cellulitis of left lower limb 12/24/2017 12/24/2017 L89.620 Pressure ulcer of left heel, unstageable 07/21/2019 07/21/2019 L97.211 Non-pressure chronic ulcer of right calf limited to breakdown of skin 07/21/2019 07/21/2019 S80.211D Abrasion, right knee, subsequent encounter 08/18/2019 08/18/2019 L89.613 Pressure ulcer of right heel, stage 3 04/17/2016 04/17/2016 Resolved Problems ICD-10 Code Description Active Date Resolved Date L89.512 Pressure ulcer of right ankle, stage 2 04/17/2016 04/17/2016 L89.522 Pressure ulcer of left ankle, stage 2 04/17/2016 04/17/2016 Electronic Signature(s) Signed: 07/26/2020 4:57:40 PM By: Linton Ham MD Entered By: Linton Ham on 07/26/2020 11:06:55 -------------------------------------------------------------------------------- Progress Note Details Patient Name: Date of Service: [name] Foley F. 07/26/2020 9:30 A M Medical Record Number: [id number] Patient Account Number: [id number] Date of Birth/Sex: Treating RN: 1945-12-13 (75 y.o. [name] [name] Primary Care Provider: [name], [name] Other  Clinician: Referring Provider: Treating Provider/Extender:  Generic) Every Other Day/15 Days Discharge Instructions: Apply over primary dressing as directed. Secondary Dressing: ABD Pad, 5x9 Every Other Day/15 Days Discharge Instructions: Apply over primary dressing as directed. Compression Wrap: Kerlix Roll 4.5x3.1 (in/yd) (Generic) Every Other Day/15 Days Discharge Instructions: Apply Kerlix and Coban compression as directed. Compression Wrap: Coban Self-Adherent Wrap 4x5 (in/yd) (Generic) Every Other Day/15 Days Discharge Instructions: Apply over Kerlix as directed. Radiology Computed Tomography (CT) Scan - left foot; without contrast;  ICD 10: CPT: Electronic Signature(s) Signed: 07/26/2020 4:57:40 PM By: Linton Ham MD Signed: 07/27/2020 5:28:50 PM By: [name] Previous Signature: 07/26/2020 11:05:24 AM Version By: Linton Ham MD Entered By: [name] on 07/26/2020 15:08:50 Prescription 07/26/2020 -------------------------------------------------------------------------------- [name] Bishop MD Patient Name: Provider: [birth year] [id number] Date of Birth: NPI#: [name] [name] [id number] Sex: DEA #: [phone] Phone #: License #: [place] Patient Address: [facility] [address] [place], [place] [address] [place], [place] [address] [phone] Allergies Sulfa Provider's Orders Computed Tomography (CT) Scan - left foot; without contrast; ICD 10: CPT: Hand Signature: Date(s): Electronic Signature(s) Signed: 07/26/2020 4:57:40 PM By: Linton Ham MD Signed: 07/27/2020 5:28:50 PM By: [name] Entered By: [name] on 07/26/2020 15:08:53 -------------------------------------------------------------------------------- Problem List Details Patient Name: Date of Service: [name] Foley F. 07/26/2020 9:30 A M Medical Record Number: [id number] Patient Account Number: [id number] Date of Birth/Sex: Treating RN: 11/05/1945 (75 y.o. [name] [name], [name] [name] Primary Care Provider: [name], [name] Other Clinician: Referring Provider: Treating Provider/Extender: [name] [name] [name] [name] Weeks in Treatment: 223 Active Problems ICD-10 Encounter Code Description Active Date MDM Diagnosis E11.621 Type 2 diabetes mellitus with foot ulcer 04/17/2016 No Yes L97.518 Non-pressure chronic ulcer of other part of right foot with other specified 05/17/2020 No Yes severity L97.221 Non-pressure chronic ulcer of left calf limited to breakdown of skin 08/15/2017 No Yes L97.321 Non-pressure chronic ulcer of left ankle  limited to breakdown of skin 07/01/2018 No Yes L97.521 Non-pressure chronic ulcer of other part of left foot limited to breakdown of 02/17/2019 No Yes skin L97.524 Non-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 06/14/2020 No Yes Inactive Problems ICD-10 Code Description Active Date Inactive Date L03.032 Cellulitis of left toe 01/06/2019 01/06/2019 Y65.993 Pressure ulcer of left heel, stage 3 12/04/2016 12/04/2016 I25.119 Atherosclerotic heart disease of native coronary artery with unspecified angina 04/17/2016 04/17/2016 pectoris L97.821 Non-pressure chronic ulcer of other part of left lower leg limited to breakdown of skin 01/06/2019 01/06/2019 S51.811D Laceration without foreign body of right forearm, subsequent encounter 10/22/2017 10/22/2017 T70.17 Acute diastolic (congestive) heart failure 04/17/2016 04/17/2016 L03.116 Cellulitis of left lower limb 12/24/2017 12/24/2017 L89.620 Pressure ulcer of left heel, unstageable 07/21/2019 07/21/2019 L97.211 Non-pressure chronic ulcer of right calf limited to breakdown of skin 07/21/2019 07/21/2019 S80.211D Abrasion, right knee, subsequent encounter 08/18/2019 08/18/2019 L89.613 Pressure ulcer of right heel, stage 3 04/17/2016 04/17/2016 Resolved Problems ICD-10 Code Description Active Date Resolved Date L89.512 Pressure ulcer of right ankle, stage 2 04/17/2016 04/17/2016 L89.522 Pressure ulcer of left ankle, stage 2 04/17/2016 04/17/2016 Electronic Signature(s) Signed: 07/26/2020 4:57:40 PM By: Linton Ham MD Entered By: Linton Ham on 07/26/2020 11:06:55 -------------------------------------------------------------------------------- Progress Note Details Patient Name: Date of Service: [name] Foley F. 07/26/2020 9:30 A M Medical Record Number: [id number] Patient Account Number: [id number] Date of Birth/Sex: Treating RN: 1945-12-13 (75 y.o. [name] [name] Primary Care Provider: [name], [name] Other  Clinician: Referring Provider: Treating Provider/Extender:  Generic) Every Other Day/15 Days Discharge Instructions: Apply over primary dressing as directed. Secondary Dressing: ABD Pad, 5x9 Every Other Day/15 Days Discharge Instructions: Apply over primary dressing as directed. Compression Wrap: Kerlix Roll 4.5x3.1 (in/yd) (Generic) Every Other Day/15 Days Discharge Instructions: Apply Kerlix and Coban compression as directed. Compression Wrap: Coban Self-Adherent Wrap 4x5 (in/yd) (Generic) Every Other Day/15 Days Discharge Instructions: Apply over Kerlix as directed. Radiology Computed Tomography (CT) Scan - left foot; without contrast;  ICD 10: CPT: Electronic Signature(s) Signed: 07/26/2020 4:57:40 PM By: Linton Ham MD Signed: 07/27/2020 5:28:50 PM By: [name] Previous Signature: 07/26/2020 11:05:24 AM Version By: Linton Ham MD Entered By: [name] on 07/26/2020 15:08:50 Prescription 07/26/2020 -------------------------------------------------------------------------------- [name] Bishop MD Patient Name: Provider: [birth year] [id number] Date of Birth: NPI#: [name] [name] [id number] Sex: DEA #: [phone] Phone #: License #: [place] Patient Address: [facility] [address] [place], [place] [address] [place], [place] [address] [phone] Allergies Sulfa Provider's Orders Computed Tomography (CT) Scan - left foot; without contrast; ICD 10: CPT: Hand Signature: Date(s): Electronic Signature(s) Signed: 07/26/2020 4:57:40 PM By: Linton Ham MD Signed: 07/27/2020 5:28:50 PM By: [name] Entered By: [name] on 07/26/2020 15:08:53 -------------------------------------------------------------------------------- Problem List Details Patient Name: Date of Service: [name] Foley F. 07/26/2020 9:30 A M Medical Record Number: [id number] Patient Account Number: [id number] Date of Birth/Sex: Treating RN: 11/05/1945 (75 y.o. [name] [name], [name] [name] Primary Care Provider: [name], [name] Other Clinician: Referring Provider: Treating Provider/Extender: [name] [name] [name] [name] Weeks in Treatment: 223 Active Problems ICD-10 Encounter Code Description Active Date MDM Diagnosis E11.621 Type 2 diabetes mellitus with foot ulcer 04/17/2016 No Yes L97.518 Non-pressure chronic ulcer of other part of right foot with other specified 05/17/2020 No Yes severity L97.221 Non-pressure chronic ulcer of left calf limited to breakdown of skin 08/15/2017 No Yes L97.321 Non-pressure chronic ulcer of left ankle  limited to breakdown of skin 07/01/2018 No Yes L97.521 Non-pressure chronic ulcer of other part of left foot limited to breakdown of 02/17/2019 No Yes skin L97.524 Non-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 06/14/2020 No Yes Inactive Problems ICD-10 Code Description Active Date Inactive Date L03.032 Cellulitis of left toe 01/06/2019 01/06/2019 Y65.993 Pressure ulcer of left heel, stage 3 12/04/2016 12/04/2016 I25.119 Atherosclerotic heart disease of native coronary artery with unspecified angina 04/17/2016 04/17/2016 pectoris L97.821 Non-pressure chronic ulcer of other part of left lower leg limited to breakdown of skin 01/06/2019 01/06/2019 S51.811D Laceration without foreign body of right forearm, subsequent encounter 10/22/2017 10/22/2017 T70.17 Acute diastolic (congestive) heart failure 04/17/2016 04/17/2016 L03.116 Cellulitis of left lower limb 12/24/2017 12/24/2017 L89.620 Pressure ulcer of left heel, unstageable 07/21/2019 07/21/2019 L97.211 Non-pressure chronic ulcer of right calf limited to breakdown of skin 07/21/2019 07/21/2019 S80.211D Abrasion, right knee, subsequent encounter 08/18/2019 08/18/2019 L89.613 Pressure ulcer of right heel, stage 3 04/17/2016 04/17/2016 Resolved Problems ICD-10 Code Description Active Date Resolved Date L89.512 Pressure ulcer of right ankle, stage 2 04/17/2016 04/17/2016 L89.522 Pressure ulcer of left ankle, stage 2 04/17/2016 04/17/2016 Electronic Signature(s) Signed: 07/26/2020 4:57:40 PM By: Linton Ham MD Entered By: Linton Ham on 07/26/2020 11:06:55 -------------------------------------------------------------------------------- Progress Note Details Patient Name: Date of Service: [name] Foley F. 07/26/2020 9:30 A M Medical Record Number: [id number] Patient Account Number: [id number] Date of Birth/Sex: Treating RN: 1945-12-13 (75 y.o. [name] [name] Primary Care Provider: [name], [name] Other  Clinician: Referring Provider: Treating Provider/Extender:  Generic) Every Other Day/15 Days Discharge Instructions: Apply over primary dressing as directed. Secondary Dressing: ABD Pad, 5x9 Every Other Day/15 Days Discharge Instructions: Apply over primary dressing as directed. Compression Wrap: Kerlix Roll 4.5x3.1 (in/yd) (Generic) Every Other Day/15 Days Discharge Instructions: Apply Kerlix and Coban compression as directed. Compression Wrap: Coban Self-Adherent Wrap 4x5 (in/yd) (Generic) Every Other Day/15 Days Discharge Instructions: Apply over Kerlix as directed. Radiology Computed Tomography (CT) Scan - left foot; without contrast;  ICD 10: CPT: Electronic Signature(s) Signed: 07/26/2020 4:57:40 PM By: Linton Ham MD Signed: 07/27/2020 5:28:50 PM By: [name] Previous Signature: 07/26/2020 11:05:24 AM Version By: Linton Ham MD Entered By: [name] on 07/26/2020 15:08:50 Prescription 07/26/2020 -------------------------------------------------------------------------------- [name] Bishop MD Patient Name: Provider: [birth year] [id number] Date of Birth: NPI#: [name] [name] [id number] Sex: DEA #: [phone] Phone #: License #: [place] Patient Address: [facility] [address] [place], [place] [address] [place], [place] [address] [phone] Allergies Sulfa Provider's Orders Computed Tomography (CT) Scan - left foot; without contrast; ICD 10: CPT: Hand Signature: Date(s): Electronic Signature(s) Signed: 07/26/2020 4:57:40 PM By: Linton Ham MD Signed: 07/27/2020 5:28:50 PM By: [name] Entered By: [name] on 07/26/2020 15:08:53 -------------------------------------------------------------------------------- Problem List Details Patient Name: Date of Service: [name] Foley F. 07/26/2020 9:30 A M Medical Record Number: [id number] Patient Account Number: [id number] Date of Birth/Sex: Treating RN: 11/05/1945 (75 y.o. [name] [name], [name] [name] Primary Care Provider: [name], [name] Other Clinician: Referring Provider: Treating Provider/Extender: [name] [name] [name] [name] Weeks in Treatment: 223 Active Problems ICD-10 Encounter Code Description Active Date MDM Diagnosis E11.621 Type 2 diabetes mellitus with foot ulcer 04/17/2016 No Yes L97.518 Non-pressure chronic ulcer of other part of right foot with other specified 05/17/2020 No Yes severity L97.221 Non-pressure chronic ulcer of left calf limited to breakdown of skin 08/15/2017 No Yes L97.321 Non-pressure chronic ulcer of left ankle  limited to breakdown of skin 07/01/2018 No Yes L97.521 Non-pressure chronic ulcer of other part of left foot limited to breakdown of 02/17/2019 No Yes skin L97.524 Non-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 06/14/2020 No Yes Inactive Problems ICD-10 Code Description Active Date Inactive Date L03.032 Cellulitis of left toe 01/06/2019 01/06/2019 Y65.993 Pressure ulcer of left heel, stage 3 12/04/2016 12/04/2016 I25.119 Atherosclerotic heart disease of native coronary artery with unspecified angina 04/17/2016 04/17/2016 pectoris L97.821 Non-pressure chronic ulcer of other part of left lower leg limited to breakdown of skin 01/06/2019 01/06/2019 S51.811D Laceration without foreign body of right forearm, subsequent encounter 10/22/2017 10/22/2017 T70.17 Acute diastolic (congestive) heart failure 04/17/2016 04/17/2016 L03.116 Cellulitis of left lower limb 12/24/2017 12/24/2017 L89.620 Pressure ulcer of left heel, unstageable 07/21/2019 07/21/2019 L97.211 Non-pressure chronic ulcer of right calf limited to breakdown of skin 07/21/2019 07/21/2019 S80.211D Abrasion, right knee, subsequent encounter 08/18/2019 08/18/2019 L89.613 Pressure ulcer of right heel, stage 3 04/17/2016 04/17/2016 Resolved Problems ICD-10 Code Description Active Date Resolved Date L89.512 Pressure ulcer of right ankle, stage 2 04/17/2016 04/17/2016 L89.522 Pressure ulcer of left ankle, stage 2 04/17/2016 04/17/2016 Electronic Signature(s) Signed: 07/26/2020 4:57:40 PM By: Linton Ham MD Entered By: Linton Ham on 07/26/2020 11:06:55 -------------------------------------------------------------------------------- Progress Note Details Patient Name: Date of Service: [name] Foley F. 07/26/2020 9:30 A M Medical Record Number: [id number] Patient Account Number: [id number] Date of Birth/Sex: Treating RN: 1945-12-13 (75 y.o. [name] [name] Primary Care Provider: [name], [name] Other  Clinician: Referring Provider: Treating Provider/Extender:  Generic) Every Other Day/15 Days Discharge Instructions: Apply over primary dressing as directed. Secondary Dressing: ABD Pad, 5x9 Every Other Day/15 Days Discharge Instructions: Apply over primary dressing as directed. Compression Wrap: Kerlix Roll 4.5x3.1 (in/yd) (Generic) Every Other Day/15 Days Discharge Instructions: Apply Kerlix and Coban compression as directed. Compression Wrap: Coban Self-Adherent Wrap 4x5 (in/yd) (Generic) Every Other Day/15 Days Discharge Instructions: Apply over Kerlix as directed. Radiology Computed Tomography (CT) Scan - left foot; without contrast;  ICD 10: CPT: Electronic Signature(s) Signed: 07/26/2020 4:57:40 PM By: Linton Ham MD Signed: 07/27/2020 5:28:50 PM By: [name] Previous Signature: 07/26/2020 11:05:24 AM Version By: Linton Ham MD Entered By: [name] on 07/26/2020 15:08:50 Prescription 07/26/2020 -------------------------------------------------------------------------------- [name] Bishop MD Patient Name: Provider: [birth year] [id number] Date of Birth: NPI#: [name] [name] [id number] Sex: DEA #: [phone] Phone #: [place] #: [place] Patient Address: [facility] [address] [place], [place] [address] [place], [place] [address] [phone] Allergies Sulfa Provider's Orders Computed Tomography (CT) Scan - left foot; without contrast; ICD 10: CPT: Hand Signature: Date(s): Electronic Signature(s) Signed: 07/26/2020 4:57:40 PM By: Linton Ham MD Signed: 07/27/2020 5:28:50 PM By: [name] Entered By: [name] on 07/26/2020 15:08:53 -------------------------------------------------------------------------------- Problem List Details Patient Name: Date of Service: [name] Foley F. 07/26/2020 9:30 A M Medical Record Number: [id number] Patient Account Number: [id number] Date of Birth/Sex: Treating RN: 11/05/1945 (75 y.o. [name] [name], [name] [name] Primary Care Provider: [name], [name] Other Clinician: Referring Provider: Treating Provider/Extender: [name] [name] [name] [name] Weeks in Treatment: 223 Active Problems ICD-10 Encounter Code Description Active Date MDM Diagnosis E11.621 Type 2 diabetes mellitus with foot ulcer 04/17/2016 No Yes L97.518 Non-pressure chronic ulcer of other part of right foot with other specified 05/17/2020 No Yes severity L97.221 Non-pressure chronic ulcer of left calf limited to breakdown of skin 08/15/2017 No Yes L97.321 Non-pressure chronic ulcer of left ankle  limited to breakdown of skin 07/01/2018 No Yes L97.521 Non-pressure chronic ulcer of other part of left foot limited to breakdown of 02/17/2019 No Yes skin L97.524 Non-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 06/14/2020 No Yes Inactive Problems ICD-10 Code Description Active Date Inactive Date L03.032 Cellulitis of left toe 01/06/2019 01/06/2019 Y65.993 Pressure ulcer of left heel, stage 3 12/04/2016 12/04/2016 I25.119 Atherosclerotic heart disease of native coronary artery with unspecified angina 04/17/2016 04/17/2016 pectoris L97.821 Non-pressure chronic ulcer of other part of left lower leg limited to breakdown of skin 01/06/2019 01/06/2019 S51.811D Laceration without foreign body of right forearm, subsequent encounter 10/22/2017 10/22/2017 T70.17 Acute diastolic (congestive) heart failure 04/17/2016 04/17/2016 L03.116 Cellulitis of left lower limb 12/24/2017 12/24/2017 L89.620 Pressure ulcer of left heel, unstageable 07/21/2019 07/21/2019 L97.211 Non-pressure chronic ulcer of right calf limited to breakdown of skin 07/21/2019 07/21/2019 S80.211D Abrasion, right knee, subsequent encounter 08/18/2019 08/18/2019 L89.613 Pressure ulcer of right heel, stage 3 04/17/2016 04/17/2016 Resolved Problems ICD-10 Code Description Active Date Resolved Date L89.512 Pressure ulcer of right ankle, stage 2 04/17/2016 04/17/2016 L89.522 Pressure ulcer of left ankle, stage 2 04/17/2016 04/17/2016 Electronic Signature(s) Signed: 07/26/2020 4:57:40 PM By: Linton Ham MD Entered By: Linton Ham on 07/26/2020 11:06:55 -------------------------------------------------------------------------------- Progress Note Details Patient Name: Date of Service: [name] Foley F. 07/26/2020 9:30 A M Medical Record Number: [id number] Patient Account Number: [id number] Date of Birth/Sex: Treating RN: 1945-12-13 (75 y.o. [name] [name] Primary Care Provider: [name], [name] Other  Clinician: Referring Provider: Treating Provider/Extender:  requires debridement. One of the wounds on the dorsal left foot is now open to bone. He has 3 wounds on the left foot and ankle dorsally the new one on the left fifth met head. An area on the right dorsal foot x2 and an area on the right lateral ankle. The original wound on the left lateral tibia which is something we follow for such a long period of time actually does not look too bad slightly smaller better surface I have not been debriding Electronic Signature(s) Signed: 07/26/2020 4:57:40 PM By: Linton Ham MD Entered By: Linton Ham on 07/26/2020 11:11:58 -------------------------------------------------------------------------------- Physician Orders Details Patient Name: Date of Service: [name] Foley F. 07/26/2020 9:30 A M Medical Record Number: [id number] Patient Account Number: [id number] Date of Birth/Sex: Treating RN: 02/25/1946 (75 y.o. [name] [name] Primary Care Provider: [name], [name] Other Clinician: Referring Provider: Treating Provider/Extender: Amberle Lyter [name] [name] Weeks in Treatment: 223 Verbal / Phone Orders: No Diagnosis Coding Follow-up Appointments ppointment in 2 weeks. - We are extending your antibiotics. Please pick up from your pharmacy when they are ready. Return A Bathing/ Shower/ Hygiene May shower with protection but do not get wound dressing(s) wet. Edema Control - Lymphedema / SCD / Other Elevate legs to the level of the heart or above for 30 minutes daily and/or when sitting, a frequency of: Avoid standing for long periods of time. Off-Loading Other: - multipodus boots to both feet Wound Treatment Wound #14 - Lower Leg Wound Laterality: Left, Lateral Cleanser: Wound Cleanser (Generic) Every Other Day/15 Days Discharge  Instructions: Cleanse the wound with wound cleanser prior to applying a clean dressing using gauze sponges, not tissue or cotton balls. Cleanser: Soap and Water Every Other Day/15 Days Discharge Instructions: May shower and wash wound with dial antibacterial soap and water prior to dressing change. Peri-Wound Care: Triamcinolone 15 (g) Every Other Day/15 Days Discharge Instructions: Use triamcinolone 15 (g) as directed Peri-Wound Care: Sween Lotion (Moisturizing lotion) Every Other Day/15 Days Discharge Instructions: Apply moisturizing lotion as directed Prim Dressing: KerraCel Ag Gelling Fiber Dressing, 4x5 in (silver alginate) (Generic) Every Other Day/15 Days ary Discharge Instructions: Apply silver alginate to wound bed as instructed Secondary Dressing: Woven Gauze Sponge, Non-Sterile 4x4 in (Generic) Every Other Day/15 Days Discharge Instructions: Apply over primary dressing as directed. Secondary Dressing: ABD Pad, 5x9 Every Other Day/15 Days Discharge Instructions: Apply over primary dressing as directed. Compression Wrap: Kerlix Roll 4.5x3.1 (in/yd) (Generic) Every Other Day/15 Days Discharge Instructions: Apply Kerlix and Coban compression as directed. Compression Wrap: Coban Self-Adherent Wrap 4x5 (in/yd) (Generic) Every Other Day/15 Days Discharge Instructions: Apply over Kerlix as directed. Wound #25 - Lower Leg Wound Laterality: Right, Lateral Cleanser: Wound Cleanser (Generic) Every Other Day/15 Days Discharge Instructions: Cleanse the wound with wound cleanser prior to applying a clean dressing using gauze sponges, not tissue or cotton balls. Cleanser: Soap and Water Every Other Day/15 Days Discharge Instructions: May shower and wash wound with dial antibacterial soap and water prior to dressing change. Peri-Wound Care: Triamcinolone 15 (g) Every Other Day/15 Days Discharge Instructions: Use triamcinolone 15 (g) as directed Peri-Wound Care: Sween Lotion (Moisturizing lotion)  Every Other Day/15 Days Discharge Instructions: Apply moisturizing lotion as directed Prim Dressing: KerraCel Ag Gelling Fiber Dressing, 4x5 in (silver alginate) (Generic) Every Other Day/15 Days ary Discharge Instructions: Apply silver alginate to wound bed as instructed Secondary Dressing: Woven Gauze Sponge, Non-Sterile 4x4 in (Generic) Every Other Day/15 Days Discharge Instructions: Apply over primary dressing as directed.  requires debridement. One of the wounds on the dorsal left foot is now open to bone. He has 3 wounds on the left foot and ankle dorsally the new one on the left fifth met head. An area on the right dorsal foot x2 and an area on the right lateral ankle. The original wound on the left lateral tibia which is something we follow for such a long period of time actually does not look too bad slightly smaller better surface I have not been debriding Electronic Signature(s) Signed: 07/26/2020 4:57:40 PM By: Linton Ham MD Entered By: Linton Ham on 07/26/2020 11:11:58 -------------------------------------------------------------------------------- Physician Orders Details Patient Name: Date of Service: [name] Foley F. 07/26/2020 9:30 A M Medical Record Number: [id number] Patient Account Number: [id number] Date of Birth/Sex: Treating RN: 02/25/1946 (75 y.o. [name] [name] Primary Care Provider: [name], [name] Other Clinician: Referring Provider: Treating Provider/Extender: Amberle Lyter [name] [name] Weeks in Treatment: 223 Verbal / Phone Orders: No Diagnosis Coding Follow-up Appointments ppointment in 2 weeks. - We are extending your antibiotics. Please pick up from your pharmacy when they are ready. Return A Bathing/ Shower/ Hygiene May shower with protection but do not get wound dressing(s) wet. Edema Control - Lymphedema / SCD / Other Elevate legs to the level of the heart or above for 30 minutes daily and/or when sitting, a frequency of: Avoid standing for long periods of time. Off-Loading Other: - multipodus boots to both feet Wound Treatment Wound #14 - Lower Leg Wound Laterality: Left, Lateral Cleanser: Wound Cleanser (Generic) Every Other Day/15 Days Discharge  Instructions: Cleanse the wound with wound cleanser prior to applying a clean dressing using gauze sponges, not tissue or cotton balls. Cleanser: Soap and Water Every Other Day/15 Days Discharge Instructions: May shower and wash wound with dial antibacterial soap and water prior to dressing change. Peri-Wound Care: Triamcinolone 15 (g) Every Other Day/15 Days Discharge Instructions: Use triamcinolone 15 (g) as directed Peri-Wound Care: Sween Lotion (Moisturizing lotion) Every Other Day/15 Days Discharge Instructions: Apply moisturizing lotion as directed Prim Dressing: KerraCel Ag Gelling Fiber Dressing, 4x5 in (silver alginate) (Generic) Every Other Day/15 Days ary Discharge Instructions: Apply silver alginate to wound bed as instructed Secondary Dressing: Woven Gauze Sponge, Non-Sterile 4x4 in (Generic) Every Other Day/15 Days Discharge Instructions: Apply over primary dressing as directed. Secondary Dressing: ABD Pad, 5x9 Every Other Day/15 Days Discharge Instructions: Apply over primary dressing as directed. Compression Wrap: Kerlix Roll 4.5x3.1 (in/yd) (Generic) Every Other Day/15 Days Discharge Instructions: Apply Kerlix and Coban compression as directed. Compression Wrap: Coban Self-Adherent Wrap 4x5 (in/yd) (Generic) Every Other Day/15 Days Discharge Instructions: Apply over Kerlix as directed. Wound #25 - Lower Leg Wound Laterality: Right, Lateral Cleanser: Wound Cleanser (Generic) Every Other Day/15 Days Discharge Instructions: Cleanse the wound with wound cleanser prior to applying a clean dressing using gauze sponges, not tissue or cotton balls. Cleanser: Soap and Water Every Other Day/15 Days Discharge Instructions: May shower and wash wound with dial antibacterial soap and water prior to dressing change. Peri-Wound Care: Triamcinolone 15 (g) Every Other Day/15 Days Discharge Instructions: Use triamcinolone 15 (g) as directed Peri-Wound Care: Sween Lotion (Moisturizing lotion)  Every Other Day/15 Days Discharge Instructions: Apply moisturizing lotion as directed Prim Dressing: KerraCel Ag Gelling Fiber Dressing, 4x5 in (silver alginate) (Generic) Every Other Day/15 Days ary Discharge Instructions: Apply silver alginate to wound bed as instructed Secondary Dressing: Woven Gauze Sponge, Non-Sterile 4x4 in (Generic) Every Other Day/15 Days Discharge Instructions: Apply over primary dressing as directed.  Generic) Every Other Day/15 Days Discharge Instructions: Apply over primary dressing as directed. Secondary Dressing: ABD Pad, 5x9 Every Other Day/15 Days Discharge Instructions: Apply over primary dressing as directed. Compression Wrap: Kerlix Roll 4.5x3.1 (in/yd) (Generic) Every Other Day/15 Days Discharge Instructions: Apply Kerlix and Coban compression as directed. Compression Wrap: Coban Self-Adherent Wrap 4x5 (in/yd) (Generic) Every Other Day/15 Days Discharge Instructions: Apply over Kerlix as directed. Radiology Computed Tomography (CT) Scan - left foot; without contrast;  ICD 10: CPT: Electronic Signature(s) Signed: 07/26/2020 4:57:40 PM By: Linton Ham MD Signed: 07/27/2020 5:28:50 PM By: [name] Previous Signature: 07/26/2020 11:05:24 AM Version By: Linton Ham MD Entered By: [name] on 07/26/2020 15:08:50 Prescription 07/26/2020 -------------------------------------------------------------------------------- [name] Bishop MD Patient Name: Provider: [birth year] [id number] Date of Birth: NPI#: [name] [name] [id number] Sex: DEA #: [phone] Phone #: [place] #: [place] Patient Address: [facility] [address] [place], [place] [address] [place], [place] [address] [phone] Allergies Sulfa Provider's Orders Computed Tomography (CT) Scan - left foot; without contrast; ICD 10: CPT: Hand Signature: Date(s): Electronic Signature(s) Signed: 07/26/2020 4:57:40 PM By: Linton Ham MD Signed: 07/27/2020 5:28:50 PM By: [name] Entered By: [name] on 07/26/2020 15:08:53 -------------------------------------------------------------------------------- Problem List Details Patient Name: Date of Service: [name] Foley F. 07/26/2020 9:30 A M Medical Record Number: [id number] Patient Account Number: [id number] Date of Birth/Sex: Treating RN: 11/05/1945 (75 y.o. [name] [name], [name] [name] Primary Care Provider: [name], [name] Other Clinician: Referring Provider: Treating Provider/Extender: [name] [name] [name] [name] Weeks in Treatment: 223 Active Problems ICD-10 Encounter Code Description Active Date MDM Diagnosis E11.621 Type 2 diabetes mellitus with foot ulcer 04/17/2016 No Yes L97.518 Non-pressure chronic ulcer of other part of right foot with other specified 05/17/2020 No Yes severity L97.221 Non-pressure chronic ulcer of left calf limited to breakdown of skin 08/15/2017 No Yes L97.321 Non-pressure chronic ulcer of left ankle  limited to breakdown of skin 07/01/2018 No Yes L97.521 Non-pressure chronic ulcer of other part of left foot limited to breakdown of 02/17/2019 No Yes skin L97.524 Non-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 06/14/2020 No Yes Inactive Problems ICD-10 Code Description Active Date Inactive Date L03.032 Cellulitis of left toe 01/06/2019 01/06/2019 Y65.993 Pressure ulcer of left heel, stage 3 12/04/2016 12/04/2016 I25.119 Atherosclerotic heart disease of native coronary artery with unspecified angina 04/17/2016 04/17/2016 pectoris L97.821 Non-pressure chronic ulcer of other part of left lower leg limited to breakdown of skin 01/06/2019 01/06/2019 S51.811D Laceration without foreign body of right forearm, subsequent encounter 10/22/2017 10/22/2017 T70.17 Acute diastolic (congestive) heart failure 04/17/2016 04/17/2016 L03.116 Cellulitis of left lower limb 12/24/2017 12/24/2017 L89.620 Pressure ulcer of left heel, unstageable 07/21/2019 07/21/2019 L97.211 Non-pressure chronic ulcer of right calf limited to breakdown of skin 07/21/2019 07/21/2019 S80.211D Abrasion, right knee, subsequent encounter 08/18/2019 08/18/2019 L89.613 Pressure ulcer of right heel, stage 3 04/17/2016 04/17/2016 Resolved Problems ICD-10 Code Description Active Date Resolved Date L89.512 Pressure ulcer of right ankle, stage 2 04/17/2016 04/17/2016 L89.522 Pressure ulcer of left ankle, stage 2 04/17/2016 04/17/2016 Electronic Signature(s) Signed: 07/26/2020 4:57:40 PM By: Linton Ham MD Entered By: Linton Ham on 07/26/2020 11:06:55 -------------------------------------------------------------------------------- Progress Note Details Patient Name: Date of Service: [name] Foley F. 07/26/2020 9:30 A M Medical Record Number: [id number] Patient Account Number: [id number] Date of Birth/Sex: Treating RN: 1945-12-13 (75 y.o. [name] [name] Primary Care Provider: [name], [name] Other  Clinician: Referring Provider: Treating Provider/Extender:  Generic) Every Other Day/15 Days Discharge Instructions: Apply over primary dressing as directed. Secondary Dressing: ABD Pad, 5x9 Every Other Day/15 Days Discharge Instructions: Apply over primary dressing as directed. Compression Wrap: Kerlix Roll 4.5x3.1 (in/yd) (Generic) Every Other Day/15 Days Discharge Instructions: Apply Kerlix and Coban compression as directed. Compression Wrap: Coban Self-Adherent Wrap 4x5 (in/yd) (Generic) Every Other Day/15 Days Discharge Instructions: Apply over Kerlix as directed. Radiology Computed Tomography (CT) Scan - left foot; without contrast;  ICD 10: CPT: Electronic Signature(s) Signed: 07/26/2020 4:57:40 PM By: Linton Ham MD Signed: 07/27/2020 5:28:50 PM By: [name] Previous Signature: 07/26/2020 11:05:24 AM Version By: Linton Ham MD Entered By: [name] on 07/26/2020 15:08:50 Prescription 07/26/2020 -------------------------------------------------------------------------------- [name] Bishop MD Patient Name: Provider: [birth year] [id number] Date of Birth: NPI#: [name] [name] [id number] Sex: DEA #: [phone] Phone #: License #: [place] Patient Address: [facility] [address] [place], [place] [address] [place], [place] [address] [phone] Allergies Sulfa Provider's Orders Computed Tomography (CT) Scan - left foot; without contrast; ICD 10: CPT: Hand Signature: Date(s): Electronic Signature(s) Signed: 07/26/2020 4:57:40 PM By: Linton Ham MD Signed: 07/27/2020 5:28:50 PM By: [name] Entered By: [name] on 07/26/2020 15:08:53 -------------------------------------------------------------------------------- Problem List Details Patient Name: Date of Service: [name] Foley F. 07/26/2020 9:30 A M Medical Record Number: [id number] Patient Account Number: [id number] Date of Birth/Sex: Treating RN: 11/05/1945 (75 y.o. [name] [name], [name] [name] Primary Care Provider: [name], [name] Other Clinician: Referring Provider: Treating Provider/Extender: [name] [name] [name] [name] Weeks in Treatment: 223 Active Problems ICD-10 Encounter Code Description Active Date MDM Diagnosis E11.621 Type 2 diabetes mellitus with foot ulcer 04/17/2016 No Yes L97.518 Non-pressure chronic ulcer of other part of right foot with other specified 05/17/2020 No Yes severity L97.221 Non-pressure chronic ulcer of left calf limited to breakdown of skin 08/15/2017 No Yes L97.321 Non-pressure chronic ulcer of left ankle  limited to breakdown of skin 07/01/2018 No Yes L97.521 Non-pressure chronic ulcer of other part of left foot limited to breakdown of 02/17/2019 No Yes skin L97.524 Non-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 06/14/2020 No Yes Inactive Problems ICD-10 Code Description Active Date Inactive Date L03.032 Cellulitis of left toe 01/06/2019 01/06/2019 Y65.993 Pressure ulcer of left heel, stage 3 12/04/2016 12/04/2016 I25.119 Atherosclerotic heart disease of native coronary artery with unspecified angina 04/17/2016 04/17/2016 pectoris L97.821 Non-pressure chronic ulcer of other part of left lower leg limited to breakdown of skin 01/06/2019 01/06/2019 S51.811D Laceration without foreign body of right forearm, subsequent encounter 10/22/2017 10/22/2017 T70.17 Acute diastolic (congestive) heart failure 04/17/2016 04/17/2016 L03.116 Cellulitis of left lower limb 12/24/2017 12/24/2017 L89.620 Pressure ulcer of left heel, unstageable 07/21/2019 07/21/2019 L97.211 Non-pressure chronic ulcer of right calf limited to breakdown of skin 07/21/2019 07/21/2019 S80.211D Abrasion, right knee, subsequent encounter 08/18/2019 08/18/2019 L89.613 Pressure ulcer of right heel, stage 3 04/17/2016 04/17/2016 Resolved Problems ICD-10 Code Description Active Date Resolved Date L89.512 Pressure ulcer of right ankle, stage 2 04/17/2016 04/17/2016 L89.522 Pressure ulcer of left ankle, stage 2 04/17/2016 04/17/2016 Electronic Signature(s) Signed: 07/26/2020 4:57:40 PM By: Linton Ham MD Entered By: Linton Ham on 07/26/2020 11:06:55 -------------------------------------------------------------------------------- Progress Note Details Patient Name: Date of Service: [name] Foley F. 07/26/2020 9:30 A M Medical Record Number: [id number] Patient Account Number: [id number] Date of Birth/Sex: Treating RN: 1945-12-13 (75 y.o. [name] [name] Primary Care Provider: [name], [name] Other  Clinician: Referring Provider: Treating Provider/Extender:  Generic) Every Other Day/15 Days Discharge Instructions: Apply over primary dressing as directed. Secondary Dressing: ABD Pad, 5x9 Every Other Day/15 Days Discharge Instructions: Apply over primary dressing as directed. Compression Wrap: Kerlix Roll 4.5x3.1 (in/yd) (Generic) Every Other Day/15 Days Discharge Instructions: Apply Kerlix and Coban compression as directed. Compression Wrap: Coban Self-Adherent Wrap 4x5 (in/yd) (Generic) Every Other Day/15 Days Discharge Instructions: Apply over Kerlix as directed. Radiology Computed Tomography (CT) Scan - left foot; without contrast;  ICD 10: CPT: Electronic Signature(s) Signed: 07/26/2020 4:57:40 PM By: Linton Ham MD Signed: 07/27/2020 5:28:50 PM By: [name] Previous Signature: 07/26/2020 11:05:24 AM Version By: Linton Ham MD Entered By: [name] on 07/26/2020 15:08:50 Prescription 07/26/2020 -------------------------------------------------------------------------------- [name] Bishop MD Patient Name: Provider: [birth year] [id number] Date of Birth: NPI#: [name] [name] [id number] Sex: DEA #: [phone] Phone #: License #: [place] Patient Address: [facility] [address] [place], [place] [address] [place], [place] [address] [phone] Allergies Sulfa Provider's Orders Computed Tomography (CT) Scan - left foot; without contrast; ICD 10: CPT: Hand Signature: Date(s): Electronic Signature(s) Signed: 07/26/2020 4:57:40 PM By: Linton Ham MD Signed: 07/27/2020 5:28:50 PM By: [name] Entered By: [name] on 07/26/2020 15:08:53 -------------------------------------------------------------------------------- Problem List Details Patient Name: Date of Service: [name] Foley F. 07/26/2020 9:30 A M Medical Record Number: [id number] Patient Account Number: [id number] Date of Birth/Sex: Treating RN: 11/05/1945 (75 y.o. [name] [name], [name] [name] Primary Care Provider: [name], [name] Other Clinician: Referring Provider: Treating Provider/Extender: [name] [name] [name] [name] Weeks in Treatment: 223 Active Problems ICD-10 Encounter Code Description Active Date MDM Diagnosis E11.621 Type 2 diabetes mellitus with foot ulcer 04/17/2016 No Yes L97.518 Non-pressure chronic ulcer of other part of right foot with other specified 05/17/2020 No Yes severity L97.221 Non-pressure chronic ulcer of left calf limited to breakdown of skin 08/15/2017 No Yes L97.321 Non-pressure chronic ulcer of left ankle  limited to breakdown of skin 07/01/2018 No Yes L97.521 Non-pressure chronic ulcer of other part of left foot limited to breakdown of 02/17/2019 No Yes skin L97.524 Non-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 06/14/2020 No Yes Inactive Problems ICD-10 Code Description Active Date Inactive Date L03.032 Cellulitis of left toe 01/06/2019 01/06/2019 Y65.993 Pressure ulcer of left heel, stage 3 12/04/2016 12/04/2016 I25.119 Atherosclerotic heart disease of native coronary artery with unspecified angina 04/17/2016 04/17/2016 pectoris L97.821 Non-pressure chronic ulcer of other part of left lower leg limited to breakdown of skin 01/06/2019 01/06/2019 S51.811D Laceration without foreign body of right forearm, subsequent encounter 10/22/2017 10/22/2017 T70.17 Acute diastolic (congestive) heart failure 04/17/2016 04/17/2016 L03.116 Cellulitis of left lower limb 12/24/2017 12/24/2017 L89.620 Pressure ulcer of left heel, unstageable 07/21/2019 07/21/2019 L97.211 Non-pressure chronic ulcer of right calf limited to breakdown of skin 07/21/2019 07/21/2019 S80.211D Abrasion, right knee, subsequent encounter 08/18/2019 08/18/2019 L89.613 Pressure ulcer of right heel, stage 3 04/17/2016 04/17/2016 Resolved Problems ICD-10 Code Description Active Date Resolved Date L89.512 Pressure ulcer of right ankle, stage 2 04/17/2016 04/17/2016 L89.522 Pressure ulcer of left ankle, stage 2 04/17/2016 04/17/2016 Electronic Signature(s) Signed: 07/26/2020 4:57:40 PM By: Linton Ham MD Entered By: Linton Ham on 07/26/2020 11:06:55 -------------------------------------------------------------------------------- Progress Note Details Patient Name: Date of Service: [name] Foley F. 07/26/2020 9:30 A M Medical Record Number: [id number] Patient Account Number: [id number] Date of Birth/Sex: Treating RN: 1945-12-13 (75 y.o. [name] [name] Primary Care Provider: [name], [name] Other  Clinician: Referring Provider: Treating Provider/Extender:

## 2020-07-28 NOTE — Progress Notes (Signed)
Myocardial Infarction, Clustered Wound: Yes Type II Diabetes, Gout, Neuropathy Photos Wound Measurements Length: (cm) 3.2 Width: (cm) 0.8 Depth: (cm) 0.5 Clustered Quantity: 4 Area: (cm) 2.011 Volume: (cm) 1.005 % Reduction in Area: -265.6% % Reduction in Volume: -1727.3% Epithelialization: None Tunneling: No Undermining: No Wound Description Classification: Grade 2 Wound Margin: Distinct, outline attached Exudate Amount: Medium Exudate Type: Purulent Exudate Color: yellow, brown, green Foul Odor After Cleansing: No Slough/Fibrino Yes Wound Bed Granulation Amount: Medium (34-66%) Exposed Structure Granulation Quality: Red, Pink Fascia Exposed: No Necrotic Amount: Medium (34-66%) Fat Layer (Subcutaneous Tissue) Exposed: Yes Necrotic Quality: Eschar, Adherent Slough Tendon Exposed: Yes Muscle Exposed: No Joint Exposed: No Bone Exposed: No Treatment Notes Wound #26 (Foot) Wound Laterality: Dorsal, Left Cleanser Wound Cleanser Discharge Instruction: Cleanse the wound with wound cleanser prior to applying a clean dressing using gauze sponges, not tissue or cotton balls. Soap and Water Discharge Instruction: May shower and wash wound with dial antibacterial soap and water prior to dressing change. Peri-Wound Care Triamcinolone 15 (g) Discharge Instruction: Use triamcinolone 15 (g) as directed Sween Lotion (Moisturizing lotion) Discharge Instruction: Apply moisturizing lotion as directed Topical Primary Dressing KerraCel Ag Gelling Fiber Dressing, 4x5 in (silver alginate) Discharge Instruction: Apply silver alginate to wound bed as instructed Secondary Dressing Woven Gauze Sponge, Non-Sterile 4x4 in Discharge Instruction: Apply over primary dressing as directed. ABD Pad, 5x9 Discharge Instruction: Apply over primary dressing as directed. Secured With Compression Wrap Kerlix Roll 4.5x3.1 (in/yd) Discharge  Instruction: Apply Kerlix and Coban compression as directed. Coban Self-Adherent Wrap 4x5 (in/yd) Discharge Instruction: Apply over Kerlix as directed. Compression Stockings Add-Ons Electronic Signature(s) Signed: 07/27/2020 5:28:50 PM By: [name] Signed: 07/28/2020 4:58:23 PM By: Sandre Kitty Entered By: Sandre Kitty on 07/26/2020 16:07:27 -------------------------------------------------------------------------------- Wound Assessment Details Patient Name: Date of Service: [name] Foley F. 07/26/2020 9:30 A M Medical Record Number: [id number] Patient Account Number: [id number] Date of Birth/Sex: Treating RN: 12/09/1945 (75 y.o. [name], [name] Primary Care Taylen Wendland: [name], [name] Other Clinician: Referring Dalani Mette: Treating Kendan Cornforth/Extender: [name], [name] [name], [name] Weeks in Treatment: 223 Wound Status Wound Number: 28 Primary Diabetic Wound/Ulcer of the Lower Extremity Etiology: Wound Location: Right, Dorsal Foot Wound Open Wounding Event: Gradually Appeared Status: Date Acquired: 04/26/2020 Comorbid Anemia, Sleep Apnea, Arrhythmia, Congestive Heart Failure, Weeks Of Treatment: 13 History: Coronary Artery Disease, Hypertension, Myocardial Infarction, Clustered Wound: Yes Type II Diabetes, Gout, Neuropathy Photos Wound Measurements Length: (cm) 0.3 Width: (cm) 0.3 Depth: (cm) 0.1 Clustered Quantity: 2 Area: (cm) 0.071 Volume: (cm) 0.007 % Reduction in Area: 93.2% % Reduction in Volume: 93.3% Epithelialization: Medium (34-66%) Tunneling: No Undermining: No Wound Description Classification: Grade 1 Wound Margin: Distinct, outline attached Exudate Amount: Small Exudate Type: Serosanguineous Exudate Color: red, brown Foul Odor After Cleansing: No Slough/Fibrino No Wound Bed Granulation Amount: Small (1-33%) Exposed Structure Granulation Quality: Red Fascia Exposed: No Necrotic Amount: Large (67-100%) Fat Layer (Subcutaneous  Tissue) Exposed: Yes Necrotic Quality: Adherent Slough Tendon Exposed: No Muscle Exposed: No Joint Exposed: No Bone Exposed: No Treatment Notes Wound #28 (Foot) Wound Laterality: Dorsal, Right Cleanser Wound Cleanser Discharge Instruction: Cleanse the wound with wound cleanser prior to applying a clean dressing using gauze sponges, not tissue or cotton balls. Soap and Water Discharge Instruction: May shower and wash wound with dial antibacterial soap and water prior to dressing change. Peri-Wound Care Triamcinolone 15 (g) Discharge Instruction: Use triamcinolone 15 (g) as directed Sween Lotion (Moisturizing lotion) Discharge Instruction: Apply moisturizing lotion as directed Topical Primary Dressing KerraCel  Peri-Wound Care Triamcinolone 15 (g) Discharge Instruction: Use triamcinolone 15 (g) as directed Sween Lotion (Moisturizing lotion) Discharge Instruction: Apply moisturizing lotion as directed Topical Primary Dressing KerraCel Ag Gelling Fiber Dressing, 4x5 in (silver alginate) Discharge Instruction: Apply silver alginate to wound bed as instructed Secondary Dressing Woven Gauze Sponge, Non-Sterile 4x4 in Discharge Instruction: Apply over primary dressing as directed. ABD Pad, 5x9 Discharge Instruction:  Apply over primary dressing as directed. Secured With Compression Wrap Kerlix Roll 4.5x3.1 (in/yd) Discharge Instruction: Apply Kerlix and Coban compression as directed. Coban Self-Adherent Wrap 4x5 (in/yd) Discharge Instruction: Apply over Kerlix as directed. Compression Stockings Add-Ons Electronic Signature(s) Signed: 07/27/2020 5:28:50 PM By: [name] Signed: 07/28/2020 4:58:23 PM By: Sandre Kitty Entered By: Sandre Kitty on 07/26/2020 16:06:52 -------------------------------------------------------------------------------- Wound Assessment Details Patient Name: Date of Service: [name] Foley F. 07/26/2020 9:30 A M Medical Record Number: [id number] Patient Account Number: [id number] Date of Birth/Sex: Treating RN: Feb 21, 1946 (75 y.o. [name], Cleona Primary Care Surina Storts: [name], [name] Other Clinician: Referring Laurynn Mccorvey: Treating Sorin Frimpong/Extender: [name], [name] [name], [name] Weeks in Treatment: 223 Wound Status Wound Number: 34 Primary Diabetic Wound/Ulcer of the Lower Extremity Etiology: Wound Location: Right, Medial, Dorsal Foot Wound Open Wounding Event: Gradually Appeared Status: Date Acquired: 06/14/2020 Comorbid Anemia, Sleep Apnea, Arrhythmia, Congestive Heart Failure, Weeks Of Treatment: 6 History: Coronary Artery Disease, Hypertension, Myocardial Infarction, Clustered Wound: No Type II Diabetes, Gout, Neuropathy Photos Wound Measurements Length: (cm) 1 Width: (cm) 0.5 Depth: (cm) 0.1 Area: (cm) 0.393 Volume: (cm) 0.039 % Reduction in Area: -42.9% % Reduction in Volume: -44.4% Epithelialization: Small (1-33%) Tunneling: No Undermining: No Wound Description Classification: Grade 2 Wound Margin: Well defined, not attached Exudate Amount: Medium Exudate Type: Serosanguineous Exudate Color: red, brown Foul Odor After Cleansing: No Slough/Fibrino Yes Wound Bed Granulation Amount: Small (1-33%) Exposed Structure Granulation  Quality: Red Fascia Exposed: No Necrotic Amount: Large (67-100%) Fat Layer (Subcutaneous Tissue) Exposed: Yes Necrotic Quality: Eschar, Adherent Slough Tendon Exposed: No Muscle Exposed: No Joint Exposed: No Bone Exposed: No Treatment Notes Wound #34 (Foot) Wound Laterality: Dorsal, Right, Medial Cleanser Wound Cleanser Discharge Instruction: Cleanse the wound with wound cleanser prior to applying a clean dressing using gauze sponges, not tissue or cotton balls. Soap and Water Discharge Instruction: May shower and wash wound with dial antibacterial soap and water prior to dressing change. Peri-Wound Care Triamcinolone 15 (g) Discharge Instruction: Use triamcinolone 15 (g) as directed Sween Lotion (Moisturizing lotion) Discharge Instruction: Apply moisturizing lotion as directed Topical Primary Dressing KerraCel Ag Gelling Fiber Dressing, 4x5 in (silver alginate) Discharge Instruction: Apply silver alginate to wound bed as instructed Secondary Dressing Woven Gauze Sponge, Non-Sterile 4x4 in Discharge Instruction: Apply over primary dressing as directed. ABD Pad, 5x9 Discharge Instruction: Apply over primary dressing as directed. Secured With Compression Wrap Kerlix Roll 4.5x3.1 (in/yd) Discharge Instruction: Apply Kerlix and Coban compression as directed. Coban Self-Adherent Wrap 4x5 (in/yd) Discharge Instruction: Apply over Kerlix as directed. Compression Stockings Add-Ons Electronic Signature(s) Signed: 07/27/2020 5:28:50 PM By: [name] Signed: 07/28/2020 4:58:23 PM By: Sandre Kitty Entered By: Sandre Kitty on 07/26/2020 15:43:38 -------------------------------------------------------------------------------- Wound Assessment Details Patient Name: Date of Service: [name] Foley F. 07/26/2020 9:30 A M Medical Record Number: [id number] Patient Account Number: [id number] Date of Birth/Sex: Treating RN: 1945/08/25 (75 y.o. [name] Primary  Care Samyah Bilbo: [name], [name] Other Clinician: Referring Jordan Caraveo: Treating Catalina Salasar/Extender: [name], [name] [name], [name] Weeks in Treatment: 223 Wound Status Wound Number: 35 Primary Diabetic Wound/Ulcer of the Lower Extremity  dressing change. Peri-Wound Care Triamcinolone 15 (g) Discharge Instruction: Use triamcinolone 15 (g) as directed Sween Lotion (Moisturizing lotion) Discharge Instruction: Apply moisturizing lotion as directed Topical Primary Dressing KerraCel Ag Gelling Fiber Dressing, 4x5 in (silver alginate) Discharge Instruction: Apply silver alginate to wound bed as instructed Secondary Dressing Woven Gauze Sponge, Non-Sterile 4x4 in Discharge Instruction: Apply over primary dressing as directed. ABD Pad, 5x9 Discharge Instruction: Apply over primary dressing as directed. Secured With Compression Wrap Kerlix Roll 4.5x3.1 (in/yd) Discharge Instruction: Apply Kerlix and Coban compression as directed. Coban Self-Adherent Wrap 4x5 (in/yd) Discharge Instruction: Apply over Kerlix as directed. Compression Stockings Add-Ons Electronic Signature(s) Signed: 07/27/2020 5:28:50 PM By: [name] Signed: 07/28/2020 4:58:23 PM By: Sandre Kitty Entered By: Sandre Kitty on 07/26/2020  16:08:34 -------------------------------------------------------------------------------- Vitals Details Patient Name: Date of Service: [name] Foley F. 07/26/2020 9:30 A M Medical Record Number: [id number] Patient Account Number: [id number] Date of Birth/Sex: Treating RN: 09-Oct-1945 (75 y.o. [name], [name] Primary Care Elayah Klooster: [name], [name] Other Clinician: Referring Gwendolin Briel: Treating Mariajose Mow/Extender: [name], [name] [name], [name] Weeks in Treatment: 223 Vital Signs Time Taken: 09:42 Temperature (F): 98.3 Height (in): 74 Pulse (bpm): 84 Weight (lbs): 150 Respiratory Rate (breaths/min): 18 Body Mass Index (BMI): 19.3 Blood Pressure (mmHg): 122/66 Capillary Blood Glucose (mg/dl): 121 Reference Range: 80 - 120 mg / dl Electronic Signature(s) Signed: 07/26/2020 2:26:07 PM By: Sandre Kitty Entered By: Sandre Kitty on 07/26/2020 09:43:02  Ag Gelling Fiber Dressing, 4x5 in (silver alginate) Discharge Instruction: Apply silver alginate to wound bed as instructed Secondary Dressing Woven Gauze Sponge, Non-Sterile 4x4 in Discharge Instruction: Apply over primary dressing as directed. ABD Pad, 5x9 Discharge Instruction: Apply over primary dressing as directed. Secured With Compression Wrap Kerlix Roll 4.5x3.1 (in/yd) Discharge Instruction: Apply Kerlix and Coban compression as directed. Coban Self-Adherent Wrap 4x5 (in/yd) Discharge Instruction: Apply over Kerlix as directed. Compression Stockings Add-Ons Electronic Signature(s) Signed: 07/27/2020 5:28:50 PM By: [name] Signed: 07/28/2020 4:58:23 PM By: Sandre Kitty Entered By: Sandre Kitty on 07/26/2020 16:04:53 -------------------------------------------------------------------------------- Wound Assessment Details Patient Name: Date of Service: [name] Foley F. 07/26/2020 9:30 A M Medical Record Number: [id number] Patient Account Number: [id number] Date of Birth/Sex: Treating RN: 11/17/1945 (75 y.o. [name], [name] Primary Care Reginia Battie: [name], [name] Other Clinician: Referring Dashia Caldeira: Treating Marine Lezotte/Extender: [name],  [name] [name], [name] Weeks in Treatment: 223 Wound Status Wound Number: 29 Primary Diabetic Wound/Ulcer of the Lower Extremity Etiology: Wound Location: Right Calcaneus Wound Healed - Epithelialized Wounding Event: Gradually Appeared Status: Date Acquired: 04/26/2020 Date Acquired: 04/26/2020 Comorbid Anemia, Sleep Apnea, Arrhythmia, Congestive Heart Failure, Weeks Of Treatment: 13 History: Coronary Artery Disease, Hypertension, Myocardial Infarction, Clustered Wound: No Type II Diabetes, Gout, Neuropathy Wound Measurements Length: (cm) Width: (cm) Depth: (cm) Area: (cm) Volume: (cm) 0 % Reduction in Area: 100% 0 % Reduction in Volume: 100% 0 Epithelialization: Large (67-100%) 0 Tunneling: No 0 Undermining: No Wound Description Classification: Grade 1 Wound Margin: Flat and Intact Exudate Amount: None Present Foul Odor After Cleansing: No Slough/Fibrino No Wound Bed Granulation Amount: None Present (0%) Exposed Structure Necrotic Amount: None Present (0%) Fascia Exposed: No Fat Layer (Subcutaneous Tissue) Exposed: No Tendon Exposed: No Muscle Exposed: No Joint Exposed: No Bone Exposed: No Electronic Signature(s) Signed: 07/26/2020 5:05:08 PM By: Baruch Gouty RN, BSN Signed: 07/27/2020 5:28:50 PM By: [name] Entered By: Baruch Gouty on 07/26/2020 10:21:30 -------------------------------------------------------------------------------- Wound Assessment Details Patient Name: Date of Service: [name] Foley F. 07/26/2020 9:30 A M Medical Record Number: [id number] Patient Account Number: [id number] Date of Birth/Sex: Treating RN: Mar 08, 1946 (75 y.o. [name], [name] Primary Care Durante Violett: [name], [name] Other Clinician: Referring Avri Paiva: Treating Dolores Ewing/Extender: [name], [name] [name], [name] Weeks in Treatment: 223 Wound Status Wound Number: 32 Primary Diabetic Wound/Ulcer of the Lower Extremity Etiology: Wound Location: Left, Distal,  Dorsal Foot Wound Open Wounding Event: Gradually Appeared Status: Date Acquired: 05/03/2020 Comorbid Anemia, Sleep Apnea, Arrhythmia, Congestive Heart Failure, Weeks Of Treatment: 12 History: Coronary Artery Disease, Hypertension, Myocardial Infarction, Clustered Wound: No Type II Diabetes, Gout, Neuropathy Photos Wound Measurements Length: (cm) 1 Width: (cm) 0.4 Depth: (cm) 0.2 Area: (cm) 0.314 Volume: (cm) 0.063 % Reduction in Area: 81.9% % Reduction in Volume: 63.8% Epithelialization: Small (1-33%) Tunneling: No Undermining: No Wound Description Classification: Grade 2 Wound Margin: Distinct, outline attached Exudate Amount: Small Exudate Type: Serosanguineous Exudate Color: red, brown Foul Odor After Cleansing: No Slough/Fibrino Yes Wound Bed Granulation Amount: Large (67-100%) Exposed Structure Granulation Quality: Red Fascia Exposed: No Necrotic Amount: None Present (0%) Fat Layer (Subcutaneous Tissue) Exposed: Yes Tendon Exposed: No Muscle Exposed: No Joint Exposed: No Bone Exposed: No Treatment Notes Wound #32 (Foot) Wound Laterality: Dorsal, Left, Distal Cleanser Wound Cleanser Discharge Instruction: Cleanse the wound with wound cleanser prior to applying a clean dressing using gauze sponges, not tissue or cotton balls. Soap and Water Discharge Instruction: May shower and wash wound with dial antibacterial soap and water prior to dressing change.  Ag Gelling Fiber Dressing, 4x5 in (silver alginate) Discharge Instruction: Apply silver alginate to wound bed as instructed Secondary Dressing Woven Gauze Sponge, Non-Sterile 4x4 in Discharge Instruction: Apply over primary dressing as directed. ABD Pad, 5x9 Discharge Instruction: Apply over primary dressing as directed. Secured With Compression Wrap Kerlix Roll 4.5x3.1 (in/yd) Discharge Instruction: Apply Kerlix and Coban compression as directed. Coban Self-Adherent Wrap 4x5 (in/yd) Discharge Instruction: Apply over Kerlix as directed. Compression Stockings Add-Ons Electronic Signature(s) Signed: 07/27/2020 5:28:50 PM By: [name] Signed: 07/28/2020 4:58:23 PM By: Sandre Kitty Entered By: Sandre Kitty on 07/26/2020 16:04:53 -------------------------------------------------------------------------------- Wound Assessment Details Patient Name: Date of Service: [name] Foley F. 07/26/2020 9:30 A M Medical Record Number: [id number] Patient Account Number: [id number] Date of Birth/Sex: Treating RN: 11/17/1945 (75 y.o. [name], [name] Primary Care Reginia Battie: [name], [name] Other Clinician: Referring Dashia Caldeira: Treating Marine Lezotte/Extender: [name],  [name] [name], [name] Weeks in Treatment: 223 Wound Status Wound Number: 29 Primary Diabetic Wound/Ulcer of the Lower Extremity Etiology: Wound Location: Right Calcaneus Wound Healed - Epithelialized Wounding Event: Gradually Appeared Status: Date Acquired: 04/26/2020 Date Acquired: 04/26/2020 Comorbid Anemia, Sleep Apnea, Arrhythmia, Congestive Heart Failure, Weeks Of Treatment: 13 History: Coronary Artery Disease, Hypertension, Myocardial Infarction, Clustered Wound: No Type II Diabetes, Gout, Neuropathy Wound Measurements Length: (cm) Width: (cm) Depth: (cm) Area: (cm) Volume: (cm) 0 % Reduction in Area: 100% 0 % Reduction in Volume: 100% 0 Epithelialization: Large (67-100%) 0 Tunneling: No 0 Undermining: No Wound Description Classification: Grade 1 Wound Margin: Flat and Intact Exudate Amount: None Present Foul Odor After Cleansing: No Slough/Fibrino No Wound Bed Granulation Amount: None Present (0%) Exposed Structure Necrotic Amount: None Present (0%) Fascia Exposed: No Fat Layer (Subcutaneous Tissue) Exposed: No Tendon Exposed: No Muscle Exposed: No Joint Exposed: No Bone Exposed: No Electronic Signature(s) Signed: 07/26/2020 5:05:08 PM By: Baruch Gouty RN, BSN Signed: 07/27/2020 5:28:50 PM By: [name] Entered By: Baruch Gouty on 07/26/2020 10:21:30 -------------------------------------------------------------------------------- Wound Assessment Details Patient Name: Date of Service: [name] Foley F. 07/26/2020 9:30 A M Medical Record Number: [id number] Patient Account Number: [id number] Date of Birth/Sex: Treating RN: Mar 08, 1946 (75 y.o. [name], [name] Primary Care Durante Violett: [name], [name] Other Clinician: Referring Avri Paiva: Treating Dolores Ewing/Extender: [name], [name] [name], [name] Weeks in Treatment: 223 Wound Status Wound Number: 32 Primary Diabetic Wound/Ulcer of the Lower Extremity Etiology: Wound Location: Left, Distal,  Dorsal Foot Wound Open Wounding Event: Gradually Appeared Status: Date Acquired: 05/03/2020 Comorbid Anemia, Sleep Apnea, Arrhythmia, Congestive Heart Failure, Weeks Of Treatment: 12 History: Coronary Artery Disease, Hypertension, Myocardial Infarction, Clustered Wound: No Type II Diabetes, Gout, Neuropathy Photos Wound Measurements Length: (cm) 1 Width: (cm) 0.4 Depth: (cm) 0.2 Area: (cm) 0.314 Volume: (cm) 0.063 % Reduction in Area: 81.9% % Reduction in Volume: 63.8% Epithelialization: Small (1-33%) Tunneling: No Undermining: No Wound Description Classification: Grade 2 Wound Margin: Distinct, outline attached Exudate Amount: Small Exudate Type: Serosanguineous Exudate Color: red, brown Foul Odor After Cleansing: No Slough/Fibrino Yes Wound Bed Granulation Amount: Large (67-100%) Exposed Structure Granulation Quality: Red Fascia Exposed: No Necrotic Amount: None Present (0%) Fat Layer (Subcutaneous Tissue) Exposed: Yes Tendon Exposed: No Muscle Exposed: No Joint Exposed: No Bone Exposed: No Treatment Notes Wound #32 (Foot) Wound Laterality: Dorsal, Left, Distal Cleanser Wound Cleanser Discharge Instruction: Cleanse the wound with wound cleanser prior to applying a clean dressing using gauze sponges, not tissue or cotton balls. Soap and Water Discharge Instruction: May shower and wash wound with dial antibacterial soap and water prior to dressing change.  dressing change. Peri-Wound Care Triamcinolone 15 (g) Discharge Instruction: Use triamcinolone 15 (g) as directed Sween Lotion (Moisturizing lotion) Discharge Instruction: Apply moisturizing lotion as directed Topical Primary Dressing KerraCel Ag Gelling Fiber Dressing, 4x5 in (silver alginate) Discharge Instruction: Apply silver alginate to wound bed as instructed Secondary Dressing Woven Gauze Sponge, Non-Sterile 4x4 in Discharge Instruction: Apply over primary dressing as directed. ABD Pad, 5x9 Discharge Instruction: Apply over primary dressing as directed. Secured With Compression Wrap Kerlix Roll 4.5x3.1 (in/yd) Discharge Instruction: Apply Kerlix and Coban compression as directed. Coban Self-Adherent Wrap 4x5 (in/yd) Discharge Instruction: Apply over Kerlix as directed. Compression Stockings Add-Ons Electronic Signature(s) Signed: 07/27/2020 5:28:50 PM By: [name] Signed: 07/28/2020 4:58:23 PM By: Sandre Kitty Entered By: Sandre Kitty on 07/26/2020  16:08:34 -------------------------------------------------------------------------------- Vitals Details Patient Name: Date of Service: [name] Foley F. 07/26/2020 9:30 A M Medical Record Number: [id number] Patient Account Number: [id number] Date of Birth/Sex: Treating RN: 09-Oct-1945 (75 y.o. [name], [name] Primary Care Elayah Klooster: [name], [name] Other Clinician: Referring Gwendolin Briel: Treating Mariajose Mow/Extender: [name], [name] [name], [name] Weeks in Treatment: 223 Vital Signs Time Taken: 09:42 Temperature (F): 98.3 Height (in): 74 Pulse (bpm): 84 Weight (lbs): 150 Respiratory Rate (breaths/min): 18 Body Mass Index (BMI): 19.3 Blood Pressure (mmHg): 122/66 Capillary Blood Glucose (mg/dl): 121 Reference Range: 80 - 120 mg / dl Electronic Signature(s) Signed: 07/26/2020 2:26:07 PM By: Sandre Kitty Entered By: Sandre Kitty on 07/26/2020 09:43:02  dressing change. Peri-Wound Care Triamcinolone 15 (g) Discharge Instruction: Use triamcinolone 15 (g) as directed Sween Lotion (Moisturizing lotion) Discharge Instruction: Apply moisturizing lotion as directed Topical Primary Dressing KerraCel Ag Gelling Fiber Dressing, 4x5 in (silver alginate) Discharge Instruction: Apply silver alginate to wound bed as instructed Secondary Dressing Woven Gauze Sponge, Non-Sterile 4x4 in Discharge Instruction: Apply over primary dressing as directed. ABD Pad, 5x9 Discharge Instruction: Apply over primary dressing as directed. Secured With Compression Wrap Kerlix Roll 4.5x3.1 (in/yd) Discharge Instruction: Apply Kerlix and Coban compression as directed. Coban Self-Adherent Wrap 4x5 (in/yd) Discharge Instruction: Apply over Kerlix as directed. Compression Stockings Add-Ons Electronic Signature(s) Signed: 07/27/2020 5:28:50 PM By: [name] Signed: 07/28/2020 4:58:23 PM By: Sandre Kitty Entered By: Sandre Kitty on 07/26/2020  16:08:34 -------------------------------------------------------------------------------- Vitals Details Patient Name: Date of Service: [name] Foley F. 07/26/2020 9:30 A M Medical Record Number: [id number] Patient Account Number: [id number] Date of Birth/Sex: Treating RN: 09-Oct-1945 (75 y.o. [name], [name] Primary Care Elayah Klooster: [name], [name] Other Clinician: Referring Gwendolin Briel: Treating Mariajose Mow/Extender: [name], [name] [name], [name] Weeks in Treatment: 223 Vital Signs Time Taken: 09:42 Temperature (F): 98.3 Height (in): 74 Pulse (bpm): 84 Weight (lbs): 150 Respiratory Rate (breaths/min): 18 Body Mass Index (BMI): 19.3 Blood Pressure (mmHg): 122/66 Capillary Blood Glucose (mg/dl): 121 Reference Range: 80 - 120 mg / dl Electronic Signature(s) Signed: 07/26/2020 2:26:07 PM By: Sandre Kitty Entered By: Sandre Kitty on 07/26/2020 09:43:02  DANIS, FILIPOVIC ([id number]) Visit Report for 07/26/2020 Arrival Information Details Patient Name: Date of Service: [name] F. 07/26/2020 9:30 A M Medical Record Number: [id number] Patient Account Number: [id number] Date of Birth/Sex: Treating RN: April 08, 1946 (75 y.o. [name], [name] Primary Care Craig Wisnewski: [name], [name] Other Clinician: Referring Lauraann Missey: Treating Cathey Fredenburg/Extender: [name], [name] [name], [name] Weeks in Treatment: 55 Visit Information History Since Last Visit Added or deleted any medications: No Patient Arrived: Wheel Chair Any new allergies or adverse reactions: No Arrival Time: 09:42 Had a fall or experienced change in No Accompanied By: daughter activities of daily living that may affect Transfer Assistance: Manual risk of falls: Patient Identification Verified: Yes Signs or symptoms of abuse/neglect since last visito No Secondary Verification Process Completed: Yes Hospitalized since last visit: No Patient Requires Transmission-Based Precautions: No Implantable device outside of the clinic excluding No Patient Has Alerts: Yes cellular tissue based products placed in the center Patient Alerts: Patient on Blood Thinner since last visit: left ABI 1.0; rt ABI 1.09 Has Dressing in Place as Prescribed: Yes Pain Present Now: No Electronic Signature(s) Signed: 07/26/2020 2:26:07 PM By: Sandre Kitty Entered By: Sandre Kitty on 07/26/2020 09:42:44 -------------------------------------------------------------------------------- Encounter Discharge Information Details Patient Name: Date of Service: [name] Foley F. 07/26/2020 9:30 A M Medical Record Number: [id number] Patient Account Number: [id number] Date of Birth/Sex: Treating RN: 1945/12/28 (75 y.o. Marcheta Grammes Primary Care Chivonne Rascon: [name], [name] Other Clinician: Referring Hines Kloss: Treating Mustaf Antonacci/Extender: [name], [name] [name], [name] Weeks in Treatment: 223 Encounter  Discharge Information Items Post Procedure Vitals Discharge Condition: Stable Temperature (F): 98.3 Ambulatory Status: Wheelchair Pulse (bpm): 84 Discharge Destination: Home Respiratory Rate (breaths/min): 18 Transportation: Private Auto Blood Pressure (mmHg): 122/66 Schedule Follow-up Appointment: Yes Clinical Summary of Care: Provided on 07/26/2020 Form Type Recipient Paper Patient Patient Electronic Signature(s) Signed: 07/26/2020 11:11:23 AM By: Lorrin Jackson Entered By: Lorrin Jackson on 07/26/2020 11:11:23 -------------------------------------------------------------------------------- Lower Extremity Assessment Details Patient Name: Date of Service: [name] Foley F. 07/26/2020 9:30 A M Medical Record Number: [id number] Patient Account Number: [id number] Date of Birth/Sex: Treating RN: October 23, 1945 (75 y.o. [name] Primary Care Shelbia Scinto: [name], [name] Other Clinician: Referring Earnestene Angello: Treating Hydeia Mcatee/Extender: [name], [name] [name], [name] Weeks in Treatment: 223 Edema Assessment Assessed: [Left: No] [Right: No] Edema: [Left: No] [Right: No] Calf Left: Right: Point of Measurement: 36 cm From Medial Instep 23.2 cm 22.5 cm Ankle Left: Right: Point of Measurement: 11 cm From Medial Instep 19.3 cm 18 cm Vascular Assessment Pulses: Dorsalis Pedis Palpable: [Left:No] [Right:No] Electronic Signature(s) Signed: 07/26/2020 5:05:08 PM By: Baruch Gouty RN, BSN Entered By: Baruch Gouty on 07/26/2020 10:17:01 -------------------------------------------------------------------------------- Multi Wound Chart Details Patient Name: Date of Service: [name] Foley F. 07/26/2020 9:30 A M Medical Record Number: [id number] Patient Account Number: [id number] Date of Birth/Sex: Treating RN: 01-09-1946 (75 y.o. [name], [place] Primary Care Marshon Bangs: [name], [name] Other Clinician: Referring Reza Crymes: Treating Mateen Franssen/Extender: [name], [name] [name],  [name] Weeks in Treatment: 223 Vital Signs Height(in): 74 Capillary Blood Glucose(mg/dl): 121 Weight(lbs): 150 Pulse(bpm): 64 Body Mass Index(BMI): 19 Blood Pressure(mmHg): 122/66 Temperature(F): 98.3 Respiratory Rate(breaths/min): 18 Photos: [14:No Photos Left, Lateral Lower Leg] [25:No Photos Right, Lateral Lower Leg] [26:No Photos Left, Dorsal Foot] Wound Location: [14:Gradually Appeared] [25:Gradually Appeared] [26:Gradually Appeared] Wounding Event: [14:Diabetic Wound/Ulcer of the Lower] [25:Venous Leg Ulcer] [26:Diabetic Wound/Ulcer of the Lower] Primary Etiology: [14:Extremity Anemia, Sleep Apnea, Arrhythmia,] [25:Anemia, Sleep Apnea, Arrhythmia,] [26:Extremity Anemia, Sleep Apnea, Arrhythmia,] Comorbid History: [14:Congestive Heart Failure, Coronary Artery Disease, Hypertension,  Ag Gelling Fiber Dressing, 4x5 in (silver alginate) Discharge Instruction: Apply silver alginate to wound bed as instructed Secondary Dressing Woven Gauze Sponge, Non-Sterile 4x4 in Discharge Instruction: Apply over primary dressing as directed. ABD Pad, 5x9 Discharge Instruction: Apply over primary dressing as directed. Secured With Compression Wrap Kerlix Roll 4.5x3.1 (in/yd) Discharge Instruction: Apply Kerlix and Coban compression as directed. Coban Self-Adherent Wrap 4x5 (in/yd) Discharge Instruction: Apply over Kerlix as directed. Compression Stockings Add-Ons Electronic Signature(s) Signed: 07/27/2020 5:28:50 PM By: [name] Signed: 07/28/2020 4:58:23 PM By: Sandre Kitty Entered By: Sandre Kitty on 07/26/2020 16:04:53 -------------------------------------------------------------------------------- Wound Assessment Details Patient Name: Date of Service: [name] Foley F. 07/26/2020 9:30 A M Medical Record Number: [id number] Patient Account Number: [id number] Date of Birth/Sex: Treating RN: 11/17/1945 (75 y.o. [name], [name] Primary Care Reginia Battie: [name], [name] Other Clinician: Referring Dashia Caldeira: Treating Marine Lezotte/Extender: [name],  [name] [name], [name] Weeks in Treatment: 223 Wound Status Wound Number: [id number] Primary Diabetic Wound/Ulcer of the Lower Extremity Etiology: Wound Location: Right Calcaneus Wound Healed - Epithelialized Wounding Event: Gradually Appeared Status: Date Acquired: 04/26/2020 Date Acquired: 04/26/2020 Comorbid Anemia, Sleep Apnea, Arrhythmia, Congestive Heart Failure, Weeks Of Treatment: 13 History: Coronary Artery Disease, Hypertension, Myocardial Infarction, Clustered Wound: No Type II Diabetes, Gout, Neuropathy Wound Measurements Length: (cm) Width: (cm) Depth: (cm) Area: (cm) Volume: (cm) 0 % Reduction in Area: 100% 0 % Reduction in Volume: 100% 0 Epithelialization: Large (67-100%) 0 Tunneling: No 0 Undermining: No Wound Description Classification: Grade 1 Wound Margin: Flat and Intact Exudate Amount: None Present Foul Odor After Cleansing: No Slough/Fibrino No Wound Bed Granulation Amount: None Present (0%) Exposed Structure Necrotic Amount: None Present (0%) Fascia Exposed: No Fat Layer (Subcutaneous Tissue) Exposed: No Tendon Exposed: No Muscle Exposed: No Joint Exposed: No Bone Exposed: No Electronic Signature(s) Signed: 07/26/2020 5:05:08 PM By: Baruch Gouty RN, BSN Signed: 07/27/2020 5:28:50 PM By: [name] Entered By: Baruch Gouty on 07/26/2020 10:21:30 -------------------------------------------------------------------------------- Wound Assessment Details Patient Name: Date of Service: [name] Foley F. 07/26/2020 9:30 A M Medical Record Number: [id number] Patient Account Number: [id number] Date of Birth/Sex: Treating RN: Mar 08, 1946 (75 y.o. [name], [name] Primary Care Durante Violett: [name], [name] Other Clinician: Referring Avri Paiva: Treating Dolores Ewing/Extender: [name], [name] [name], [name] Weeks in Treatment: 223 Wound Status Wound Number: 32 Primary Diabetic Wound/Ulcer of the Lower Extremity Etiology: Wound Location: Left, Distal,  Dorsal Foot Wound Open Wounding Event: Gradually Appeared Status: Date Acquired: 05/03/2020 Comorbid Anemia, Sleep Apnea, Arrhythmia, Congestive Heart Failure, Weeks Of Treatment: 12 History: Coronary Artery Disease, Hypertension, Myocardial Infarction, Clustered Wound: No Type II Diabetes, Gout, Neuropathy Photos Wound Measurements Length: (cm) 1 Width: (cm) 0.4 Depth: (cm) 0.2 Area: (cm) 0.314 Volume: (cm) 0.063 % Reduction in Area: 81.9% % Reduction in Volume: 63.8% Epithelialization: Small (1-33%) Tunneling: No Undermining: No Wound Description Classification: Grade 2 Wound Margin: Distinct, outline attached Exudate Amount: Small Exudate Type: Serosanguineous Exudate Color: red, brown Foul Odor After Cleansing: No Slough/Fibrino Yes Wound Bed Granulation Amount: Large (67-100%) Exposed Structure Granulation Quality: Red Fascia Exposed: No Necrotic Amount: None Present (0%) Fat Layer (Subcutaneous Tissue) Exposed: Yes Tendon Exposed: No Muscle Exposed: No Joint Exposed: No Bone Exposed: No Treatment Notes Wound #32 (Foot) Wound Laterality: Dorsal, Left, Distal Cleanser Wound Cleanser Discharge Instruction: Cleanse the wound with wound cleanser prior to applying a clean dressing using gauze sponges, not tissue or cotton balls. Soap and Water Discharge Instruction: May shower and wash wound with dial antibacterial soap and water prior to dressing change.  No Joint: No Joint: No Joint: No Bone: No Bone: No Bone: No Medium (34-66%) Large (67-100%) Small (1-33%) Epithelialization: N/A N/A N/A Debridement: N/A N/A N/A Pain Control: N/A N/A N/A Tissue Debrided: N/A N/A  N/A Level: N/A N/A N/A Debridement A (sq cm): rea N/A N/A N/A Instrument: N/A N/A N/A Bleeding: N/A N/A N/A Hemostasis A chieved: N/A N/A N/A Procedural Pain: N/A N/A N/A Post Procedural Pain: N/A N/A N/A Debridement Treatment Response: N/A N/A N/A Post Debridement Measurements L x W x D (cm) N/A N/A N/A Post Debridement Volume: (cm) N/A N/A N/A Procedures Performed: Wound Number: [id number] Photos: No Photos No Photos No Photos Right, Medial, Dorsal Foot Left, Lateral, Dorsal Foot Left, Lateral Foot Wound Location: Gradually Appeared Gradually Appeared Gradually Appeared Wounding Event: Diabetic Wound/Ulcer of the Lower 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 Primary Etiology: Extremity Extremity Anemia, Sleep Apnea, Arrhythmia, Anemia, Sleep Apnea, Arrhythmia, Anemia, Sleep Apnea, Arrhythmia, Comorbid History: Congestive Heart Failure, Coronary Congestive Heart Failure, Coronary Congestive Heart Failure, Coronary Artery Disease, Hypertension, Artery Disease, Hypertension, Artery Disease, Hypertension, Myocardial Infarction, Type II Myocardial Infarction, Type II Myocardial Infarction, Type II Diabetes, Gout, Neuropathy Diabetes, Gout, Neuropathy Diabetes, Gout, Neuropathy 06/14/2020 05/24/2020 07/19/2020 Date Acquired: 6 6 0 Weeks of Treatment: Open Open Open Wound Status: No No No Clustered Wound: N/A N/A N/A Clustered Quantity: 1x0.5x0.1 1.4x1.7x0.5 2.6x2.1x0.2 Measurements L x W x D (cm) 0.393 1.869 4.288 A (cm) : rea 0.039 0.935 0.858 Volume (cm) : -42.90% -296.80% 0.00% % Reduction in A rea: -44.40% -296.20% 0.00% % Reduction in Volume: Grade 2 Grade 2 Full Thickness Without Exposed Classification: Support Structures Medium Medium Medium Exudate A mount: Serosanguineous Purulent Serosanguineous Exudate Type: red, brown yellow, brown, green red, brown Exudate Color: Well defined, not attached Distinct, outline attached Flat and  Intact Wound Margin: Small (1-33%) Small (1-33%) Small (1-33%) Granulation A mount: Red Red, Hyper-granulation Red Granulation Quality: Large (67-100%) Large (67-100%) Large (67-100%) Necrotic A mount: Eschar, Adherent Slough Adherent [facility], Adherent Slough Necrotic Tissue: Fat Layer (Subcutaneous Tissue): Yes Fat Layer (Subcutaneous Tissue): Yes Fat Layer (Subcutaneous Tissue): Yes Exposed Structures: Fascia: No Bone: Yes Fascia: No Tendon: No Fascia: No Tendon: No Muscle: No Tendon: No Muscle: No Joint: No Muscle: No Joint: No Bone: No Joint: No Bone: No Small (1-33%) None Small (1-33%) Epithelialization: Debridement - Excisional N/A Debridement - Excisional Debridement: Pre-procedure Verification/Time Out 10:53 N/A 10:50 Taken: Lidocaine N/A Lidocaine Pain Control: Necrotic/Eschar, Subcutaneous, N/A Necrotic/Eschar, Subcutaneous, Tissue Debrided: [employer] Skin/Subcutaneous Tissue N/A Skin/Subcutaneous Tissue Level: 0.5 N/A 5.46 Debridement A (sq cm): rea Curette N/A Curette Instrument: Minimum N/A Minimum Bleeding: Pressure N/A Pressure Hemostasis Achieved: 0 N/A 0 Procedural Pain: 0 N/A 0 Post Procedural Pain: Debridement Treatment Response: Procedure was tolerated well N/A Procedure was tolerated well Post Debridement Measurements L x 1x0.5x1 N/A 2.6x2.1x0.2 W x D (cm) 0.393 N/A 0.858 Post Debridement Volume: (cm) Debridement N/A Debridement Procedures Performed: Treatment Notes Electronic Signature(s) Signed: 07/26/2020 4:57:40 PM By: Linton Ham MD Signed: 07/27/2020 5:28:50 PM By: [name] Entered By: Linton Ham on 07/26/2020 11:07:04 -------------------------------------------------------------------------------- Multi-Disciplinary Care Plan Details Patient Name: Date of Service: [name] Foley F. 07/26/2020 9:30 A M Medical Record Number: [id number] Patient Account Number: [id number] Date of  Birth/Sex: Treating RN: Sep 24, 1945 (75 y.o. [name] Primary Care Rashan Patient: [name], [name] Other Clinician: Referring Jonnelle Lawniczak: Treating Jakel Alphin/Extender: [name], [name] [name], [name] Weeks in Treatment: [place] reviewed with physician Active Inactive Wound/Skin

## 2020-08-02 ENCOUNTER — Ambulatory Visit: Payer: Medicare HMO | Admitting: Vascular Surgery

## 2020-08-09 ENCOUNTER — Encounter (HOSPITAL_COMMUNITY): Payer: Self-pay

## 2020-08-09 ENCOUNTER — Ambulatory Visit (HOSPITAL_COMMUNITY): Payer: Medicare HMO

## 2020-08-09 ENCOUNTER — Other Ambulatory Visit: Payer: Self-pay

## 2020-08-09 ENCOUNTER — Encounter (HOSPITAL_BASED_OUTPATIENT_CLINIC_OR_DEPARTMENT_OTHER): Payer: Medicare HMO | Admitting: Internal Medicine

## 2020-08-09 DIAGNOSIS — L97518 Non-pressure chronic ulcer of other part of right foot with other specified severity: Secondary | ICD-10-CM

## 2020-08-09 DIAGNOSIS — L97221 Non-pressure chronic ulcer of left calf limited to breakdown of skin: Secondary | ICD-10-CM | POA: Diagnosis not present

## 2020-08-09 DIAGNOSIS — L89613 Pressure ulcer of right heel, stage 3: Secondary | ICD-10-CM | POA: Diagnosis not present

## 2020-08-09 DIAGNOSIS — L97521 Non-pressure chronic ulcer of other part of left foot limited to breakdown of skin: Secondary | ICD-10-CM

## 2020-08-09 DIAGNOSIS — E11621 Type 2 diabetes mellitus with foot ulcer: Secondary | ICD-10-CM | POA: Diagnosis not present

## 2020-08-09 DIAGNOSIS — L97524 Non-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: Secondary | ICD-10-CM | POA: Diagnosis not present

## 2020-08-09 DIAGNOSIS — E11622 Type 2 diabetes mellitus with other skin ulcer: Secondary | ICD-10-CM | POA: Diagnosis not present

## 2020-08-09 DIAGNOSIS — L97321 Non-pressure chronic ulcer of left ankle limited to breakdown of skin: Secondary | ICD-10-CM | POA: Diagnosis not present

## 2020-08-09 DIAGNOSIS — N312 Flaccid neuropathic bladder, not elsewhere classified: Secondary | ICD-10-CM | POA: Diagnosis not present

## 2020-08-11 DIAGNOSIS — L89620 Pressure ulcer of left heel, unstageable: Secondary | ICD-10-CM | POA: Diagnosis not present

## 2020-08-11 DIAGNOSIS — L97211 Non-pressure chronic ulcer of right calf limited to breakdown of skin: Secondary | ICD-10-CM | POA: Diagnosis not present

## 2020-08-11 DIAGNOSIS — L89613 Pressure ulcer of right heel, stage 3: Secondary | ICD-10-CM | POA: Diagnosis not present

## 2020-08-11 DIAGNOSIS — L97321 Non-pressure chronic ulcer of left ankle limited to breakdown of skin: Secondary | ICD-10-CM | POA: Diagnosis not present

## 2020-08-11 DIAGNOSIS — L89522 Pressure ulcer of left ankle, stage 2: Secondary | ICD-10-CM | POA: Diagnosis not present

## 2020-08-11 DIAGNOSIS — L89623 Pressure ulcer of left heel, stage 3: Secondary | ICD-10-CM | POA: Diagnosis not present

## 2020-08-11 DIAGNOSIS — L97221 Non-pressure chronic ulcer of left calf limited to breakdown of skin: Secondary | ICD-10-CM | POA: Diagnosis not present

## 2020-08-11 DIAGNOSIS — L89512 Pressure ulcer of right ankle, stage 2: Secondary | ICD-10-CM | POA: Diagnosis not present

## 2020-08-11 DIAGNOSIS — L97521 Non-pressure chronic ulcer of other part of left foot limited to breakdown of skin: Secondary | ICD-10-CM | POA: Diagnosis not present
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
## 2020-08-16 ENCOUNTER — Ambulatory Visit (INDEPENDENT_AMBULATORY_CARE_PROVIDER_SITE_OTHER): Payer: Medicare HMO | Admitting: Vascular Surgery

## 2020-08-16 ENCOUNTER — Other Ambulatory Visit: Payer: Self-pay

## 2020-08-16 ENCOUNTER — Encounter: Payer: Self-pay | Admitting: Vascular Surgery

## 2020-08-16 ENCOUNTER — Other Ambulatory Visit (HOSPITAL_COMMUNITY): Payer: Self-pay

## 2020-08-16 ENCOUNTER — Encounter (HOSPITAL_COMMUNITY): Payer: Self-pay

## 2020-08-16 ENCOUNTER — Ambulatory Visit (HOSPITAL_COMMUNITY)
Admission: RE | Admit: 2020-08-16 | Discharge: 2020-08-16 | Disposition: A | Discharge status: Home / Self Care | Payer: Medicare HMO | Source: Ambulatory Visit | Attending: Internal Medicine | Admitting: Internal Medicine

## 2020-08-16 VITALS — BP 96/62 | HR 71 | Temp 97.2°F | Resp 16 | Ht 74.0 in | Wt 202.0 lb

## 2020-08-16 DIAGNOSIS — E11621 Type 2 diabetes mellitus with foot ulcer: Secondary | ICD-10-CM

## 2020-08-16 DIAGNOSIS — L97524 Non-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: Secondary | ICD-10-CM | POA: Diagnosis not present

## 2020-08-16 DIAGNOSIS — M6258 Muscle wasting and atrophy, not elsewhere classified, other site: Secondary | ICD-10-CM | POA: Diagnosis not present

## 2020-08-16 DIAGNOSIS — L97529 Non-pressure chronic ulcer of other part of left foot with unspecified severity: Secondary | ICD-10-CM

## 2020-08-16 DIAGNOSIS — S91302A Unspecified open wound, left foot, initial encounter: Secondary | ICD-10-CM | POA: Diagnosis not present

## 2020-08-16 DIAGNOSIS — I5042 Chronic combined systolic (congestive) and diastolic (congestive) heart failure: Secondary | ICD-10-CM
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
## 2020-08-16 NOTE — Progress Notes (Signed)
Orders Placed This Encounter  Procedures  . ECHOCARDIOGRAM COMPLETE    Standing Status:   Future    Standing Expiration Date:   08/16/2021    Order Specific Question:   Where should this test be performed    Answer:   [place]    Order Specific Question:   Perflutren DEFINITY (image enhancing agent) should be administered unless hypersensitivity or allergy exist    Answer:   Administer Perflutren    Order Specific Question:   Reason for exam-Echo    Answer:   Congestive Heart Failure  I50.9    Order Specific Question:   Release to patient    Answer:   Immediate

## 2020-08-18 ENCOUNTER — Ambulatory Visit (HOSPITAL_COMMUNITY)
Admission: RE | Admit: 2020-08-18 | Discharge: 2020-08-18 | Disposition: A | Discharge status: Home / Self Care | Payer: Medicare HMO | Source: Ambulatory Visit | Attending: Family Medicine | Admitting: Family Medicine

## 2020-08-18 ENCOUNTER — Other Ambulatory Visit: Payer: Self-pay

## 2020-08-18 ENCOUNTER — Encounter (HOSPITAL_COMMUNITY): Payer: Self-pay | Admitting: Internal Medicine

## 2020-08-18 ENCOUNTER — Ambulatory Visit (HOSPITAL_BASED_OUTPATIENT_CLINIC_OR_DEPARTMENT_OTHER)
Admission: RE | Admit: 2020-08-18 | Discharge: 2020-08-18 | Disposition: A | Discharge status: Home / Self Care | Payer: Medicare HMO | Source: Ambulatory Visit | Attending: Internal Medicine | Admitting: Internal Medicine

## 2020-08-18 VITALS — BP 104/64 | HR 72

## 2020-08-18 DIAGNOSIS — Z7982 Long term (current) use of aspirin: Secondary | ICD-10-CM | POA: Insufficient documentation

## 2020-08-18 DIAGNOSIS — Z7902 Long term (current) use of antithrombotics/antiplatelets: Secondary | ICD-10-CM | POA: Diagnosis not present

## 2020-08-18 DIAGNOSIS — N319 Neuromuscular dysfunction of bladder, unspecified: Secondary | ICD-10-CM | POA: Insufficient documentation

## 2020-08-18 DIAGNOSIS — Z794 Long term (current) use of insulin: Secondary | ICD-10-CM | POA: Diagnosis not present

## 2020-08-18 DIAGNOSIS — I2510 Atherosclerotic heart disease of native coronary artery without angina pectoris: Secondary | ICD-10-CM

## 2020-08-18 DIAGNOSIS — I252 Old myocardial infarction: Secondary | ICD-10-CM | POA: Diagnosis not present

## 2020-08-18 DIAGNOSIS — N1831 Chronic kidney disease, stage 3a: Secondary | ICD-10-CM

## 2020-08-18 DIAGNOSIS — Z95810 Presence of automatic (implantable) cardiac defibrillator: Secondary | ICD-10-CM | POA: Diagnosis not present

## 2020-08-18 DIAGNOSIS — I255 Ischemic cardiomyopathy: Secondary | ICD-10-CM | POA: Diagnosis not present

## 2020-08-18 DIAGNOSIS — Z79899 Other long term (current) drug therapy: Secondary | ICD-10-CM | POA: Diagnosis not present

## 2020-08-18 DIAGNOSIS — I472 Ventricular tachycardia: Secondary | ICD-10-CM | POA: Insufficient documentation

## 2020-08-18 DIAGNOSIS — E1122 Type 2 diabetes mellitus with diabetic chronic kidney disease: Secondary | ICD-10-CM | POA: Insufficient documentation

## 2020-08-18 DIAGNOSIS — Z993 Dependence on wheelchair: Secondary | ICD-10-CM | POA: Diagnosis not present

## 2020-08-18 DIAGNOSIS — I5022 Chronic systolic (congestive) heart failure: Secondary | ICD-10-CM | POA: Diagnosis not present

## 2020-08-18 DIAGNOSIS — I350 Nonrheumatic aortic (valve) stenosis: Secondary | ICD-10-CM | POA: Insufficient documentation

## 2020-08-18 DIAGNOSIS — G8220 Paraplegia, unspecified: Secondary | ICD-10-CM | POA: Insufficient documentation

## 2020-08-18 DIAGNOSIS — I5042 Chronic combined systolic (congestive) and diastolic (congestive) heart failure: Secondary | ICD-10-CM | POA: Insufficient documentation

## 2020-08-18 DIAGNOSIS — N320 Bladder-neck obstruction: Secondary | ICD-10-CM | POA: Insufficient documentation

## 2020-08-18 DIAGNOSIS — N184 Chronic kidney disease, stage 4 (severe): Secondary | ICD-10-CM | POA: Insufficient documentation

## 2020-08-18 DIAGNOSIS — I130 Hypertensive heart and chronic kidney disease with heart failure and stage 1 through stage 4 chronic kidney disease, or unspecified chronic kidney disease: Secondary | ICD-10-CM | POA: Diagnosis not present

## 2020-08-18 LAB — ECHOCARDIOGRAM COMPLETE
AR max vel: 1.1 cm2
AV Area VTI: 1.11 cm2
AV Area mean vel: 1.08 cm2
AV Mean grad: 7 mmHg
AV Peak grad: 12.4 mmHg
Ao pk vel: 1.76 m/s
Area-P 1/2: 3.65 cm2
S' Lateral: 4.4 cm

## 2020-08-18 MED ORDER — PERFLUTREN LIPID MICROSPHERE
1.0000 mL | INTRAVENOUS | Status: DC | PRN
  Administered 2020-08-18: 2 mL via INTRAVENOUS
  Filled 2020-08-18: qty 10

## 2020-08-18 NOTE — Progress Notes (Signed)
  Echocardiogram 2D Echocardiogram has been performed.  [name] [name] 08/18/2020, 11:12 AM

## 2020-08-18 NOTE — Patient Instructions (Signed)
Please call our office in December to schedule your follow up appointment  If you have any questions or concerns before your next appointment please send [place] a message through [place] or call our office at [phone].    TO LEAVE A MESSAGE FOR THE NURSE SELECT OPTION 2, PLEASE LEAVE A MESSAGE INCLUDING: . YOUR NAME . DATE OF BIRTH . CALL BACK NUMBER . REASON FOR CALL**this is important as we prioritize the call backs  [place] AS LONG AS YOU CALL BEFORE 4:00 PM  At the [facility], you and your health needs are our priority. As part of our continuing mission to provide you with exceptional heart care, we have created designated Provider Care Teams. These Care Teams include your primary Cardiologist (physician) and Advanced Practice Providers (APPs- Physician Assistants and Nurse Practitioners) who all work together to provide you with the care you need, when you need it.   You may see any of the following providers on your designated Care Team at your next follow up: Marland Kitchen Dr Glori Bickers . Dr Loralie Champagne . Dr Vickki Muff . Darrick Grinder, NP . Lyda Jester, [place] . Audry Riles, PharmD   Please be sure to bring in all your medications bottles to every appointment.

## 2020-08-18 NOTE — Addendum Note (Signed)
Encounter addended by: Jolaine Artist, MD on: 08/18/2020 12:40 PM  Actions taken: Charge Capture section accepted

## 2020-08-22 ENCOUNTER — Encounter (HOSPITAL_BASED_OUTPATIENT_CLINIC_OR_DEPARTMENT_OTHER): Payer: Medicare HMO | Admitting: Internal Medicine

## 2020-08-23 ENCOUNTER — Encounter (HOSPITAL_BASED_OUTPATIENT_CLINIC_OR_DEPARTMENT_OTHER): Payer: Medicare HMO | Attending: Internal Medicine | Admitting: Internal Medicine

## 2020-08-23 DIAGNOSIS — Z950 Presence of cardiac pacemaker: Secondary | ICD-10-CM | POA: Diagnosis not present

## 2020-08-23 DIAGNOSIS — N183 Chronic kidney disease, stage 3 unspecified: Secondary | ICD-10-CM | POA: Insufficient documentation

## 2020-08-23 DIAGNOSIS — E1122 Type 2 diabetes mellitus with diabetic chronic kidney disease: Secondary | ICD-10-CM | POA: Diagnosis not present

## 2020-08-23 DIAGNOSIS — Z79899 Other long term (current) drug therapy: Secondary | ICD-10-CM | POA: Insufficient documentation

## 2020-08-23 DIAGNOSIS — L97524 Non-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: Secondary | ICD-10-CM | POA: Insufficient documentation

## 2020-08-23 DIAGNOSIS — I252 Old myocardial infarction: Secondary | ICD-10-CM | POA: Insufficient documentation

## 2020-08-23 DIAGNOSIS — I130 Hypertensive heart and chronic kidney disease with heart failure and stage 1 through stage 4 chronic kidney disease, or unspecified chronic kidney disease: Secondary | ICD-10-CM | POA: Insufficient documentation

## 2020-08-23 DIAGNOSIS — E11622 Type 2 diabetes mellitus with other skin ulcer: Secondary | ICD-10-CM | POA: Diagnosis not present

## 2020-08-23 DIAGNOSIS — B9562 Methicillin resistant Staphylococcus aureus infection as the cause of diseases classified elsewhere: Secondary | ICD-10-CM | POA: Diagnosis not present

## 2020-08-23 DIAGNOSIS — M86172 Other acute osteomyelitis, left ankle and foot: Secondary | ICD-10-CM | POA: Insufficient documentation

## 2020-08-23 DIAGNOSIS — I5042 Chronic combined systolic (congestive) and diastolic (congestive) heart failure: Secondary | ICD-10-CM | POA: Insufficient documentation

## 2020-08-23 DIAGNOSIS — B964 Proteus (mirabilis) (morganii) as the cause of diseases classified elsewhere: Secondary | ICD-10-CM | POA: Diagnosis not present

## 2020-08-23 DIAGNOSIS — Z1629 Resistance to other single specified antibiotic: Secondary | ICD-10-CM | POA: Diagnosis not present

## 2020-08-23 DIAGNOSIS — E1151 Type 2 diabetes mellitus with diabetic peripheral angiopathy without gangrene: Secondary | ICD-10-CM | POA: Insufficient documentation

## 2020-08-23 DIAGNOSIS — L97512 Non-pressure chronic ulcer of other part of right foot with fat layer exposed: Secondary | ICD-10-CM | POA: Insufficient documentation

## 2020-08-23 DIAGNOSIS — E11621 Type 2 diabetes mellitus with foot ulcer: Secondary | ICD-10-CM | POA: Insufficient documentation

## 2020-08-23 DIAGNOSIS — Z882 Allergy status to sulfonamides status: Secondary | ICD-10-CM | POA: Insufficient documentation

## 2020-08-23 DIAGNOSIS — B9689 Other specified bacterial agents as the cause of diseases classified elsewhere: Secondary | ICD-10-CM | POA: Diagnosis not present

## 2020-08-23 DIAGNOSIS — Z1611 Resistance to penicillins: Secondary | ICD-10-CM | POA: Diagnosis not present

## 2020-08-23 DIAGNOSIS — L97222 Non-pressure chronic ulcer of left calf with fat layer exposed: Secondary | ICD-10-CM | POA: Insufficient documentation

## 2020-08-23 DIAGNOSIS — L97522 Non-pressure chronic ulcer of other part of left foot with fat layer exposed: Secondary | ICD-10-CM | POA: Insufficient documentation

## 2020-08-23 DIAGNOSIS — E1169 Type 2 diabetes mellitus with other specified complication: Secondary | ICD-10-CM | POA: Insufficient documentation

## 2020-08-23 DIAGNOSIS — I872 Venous insufficiency (chronic) (peripheral): Secondary | ICD-10-CM | POA: Diagnosis not present

## 2020-08-23 DIAGNOSIS — B952 Enterococcus as the cause of diseases classified elsewhere: Secondary | ICD-10-CM | POA: Diagnosis not present

## 2020-08-23 DIAGNOSIS — Z1623 Resistance to quinolones and fluoroquinolones: Secondary | ICD-10-CM | POA: Diagnosis not present

## 2020-08-23 DIAGNOSIS — E1142 Type 2 diabetes mellitus with diabetic polyneuropathy: Secondary | ICD-10-CM | POA: Diagnosis not present

## 2020-08-23 DIAGNOSIS — B951 Streptococcus, group B, as the cause of diseases classified elsewhere: Secondary | ICD-10-CM | POA: Diagnosis not present

## 2020-08-23 DIAGNOSIS — L97526 Non-pressure chronic ulcer of other part of left foot with bone involvement without evidence of necrosis: Secondary | ICD-10-CM | POA: Diagnosis not present

## 2020-08-23 DIAGNOSIS — L97812 Non-pressure chronic ulcer of other part of right lower leg with fat layer exposed: Secondary | ICD-10-CM | POA: Insufficient documentation

## 2020-08-26 NOTE — Progress Notes (Signed)
challenge such as micro emboli or vasculitis etc. 4/19; Patient presents for 2-week follow-up. He has no complaints today. He finished his course of antibiotics scheduled at last visit for a culture that grew moderate strep group B. He has no new wounds today. His daughter continues to do his dressings 5/3; 2-week follow-up. Since he was last here he had a CT scan of his left foot that suggested osteomyelitis over the fifth MTP joint which is  unfortunately what we expected to see. His daughter had called in late last week to say there was an odor and exposed bone. I did not appreciate an odor and there was no exposed bone but there certainly was a necrotic surface. He also has areas on the left dorsal ankle right dorsal ankle left lateral lower leg He also saw Dr. Loletta Specter on 4/26. 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 He had a CO2 angiogram mostly had a brisk peroneal signal. It was felt that he would only have an option of an amputation from vascular surgery point of view. The patient and his daughter are very opposed to this although he is only minimally ambulatory Electronic Signature(s) Signed: 08/23/2020 4:28:01 PM By: [name] Ham MD Entered By: [name] [name] on 08/23/2020 13:02:39 -------------------------------------------------------------------------------- Physical Exam Details Patient Name: Date of Service: [name] [name] F. 08/23/2020 9:30 [place] Record Number: [id number] Patient Account Number: [id number] Date of Birth/Sex: Treating RN: Jan 25, 1946 (75 y.o. Male) [name] [name] Primary Care Provider: [name], [name] Other Clinician: Referring Provider: Treating Provider/Extender: [name] [name] [name] [name] [name] [name] in Treatment: 227 Notes Wound exam; Right lower extremity he has an area on the dorsal foot. On the left leg he has the original wound on the left lateral leg at the level of the tibia, left dorsal foot and ankle. The new wound from 2 [name] [name] ago over the left fifth met head is necrotic but there was no exposed bone. I remove this necrotic material underlying tendon to get access to bone I used rongeurs to obtain a specimen of bone for PCR culture Electronic Signature(s) Signed: 08/23/2020 4:28:01 PM By: [name] Ham MD Entered By: [name] [name] on 08/23/2020 13:00:18 -------------------------------------------------------------------------------- Physician  Orders Details Patient Name: Date of Service: [name] [name] F. 08/23/2020 9:30 A M Medical Record Number: [id number] Patient Account Number: [id number] Date of Birth/Sex: Treating RN: 02/09/1946 (75 y.o. Male) [name] [name] Primary Care Provider: [name], [name] Other Clinician: Referring Provider: Treating Provider/Extender: [name] [name] [name] [name] [name] [name] in Treatment: 227 Verbal / Phone Orders: No Diagnosis Coding Follow-up Appointments ppointment in 1 week. - Dr. Dellia [name] Return A Bathing/ Shower/ Hygiene May shower with protection but do not get wound dressing(s) wet. Edema Control - Lymphedema / SCD / Other Elevate legs to the level of the heart or above for 30 minutes daily and/or when sitting, a frequency of: Avoid standing for long periods of time. Off-Loading Other: - multipodus boots to both feet Wound Treatment Wound #14 - Lower Leg Wound Laterality: Left, Lateral Cleanser: Wound Cleanser (Generic) Every Other Day/15 Days Discharge Instructions: Cleanse the wound with wound cleanser prior to applying a clean dressing using gauze sponges, not tissue or cotton balls. Cleanser: Soap and Water Every Other Day/15 Days Discharge Instructions: May shower and wash wound with dial antibacterial soap and water prior to dressing change. Peri-Wound Care: Triamcinolone 15 (g) Every Other Day/15 Days Discharge Instructions: Use triamcinolone 15 (g) as directed Peri-Wound Care: Sween Lotion (Moisturizing lotion) Every Other  VAL, SCHIAVO ([id number]) Visit Report for 08/23/2020 Debridement Details Patient Name: Date of Service: [name] [name] [facility] F. 08/23/2020 9:30 A M Medical Record Number: [id number] Patient Account Number: [id number] Date of Birth/Sex: Treating RN: [birth year] (75 y.o. Male) [name] [name] Primary Care Provider: [name], [name] Other Clinician: Referring Provider: Treating Provider/Extender: [name] [name] [name] [name] [name] [name] in Treatment: 227 Debridement Performed for Assessment: Wound #36 Left,Lateral Foot Performed By: Physician [name] [name]., MD Debridement Type: Debridement Severity of Tissue Pre Debridement: Bone involvement without necrosis Level of Consciousness (Pre-procedure): Awake and Alert Pre-procedure Verification/Time Out Yes - 10:55 Taken: Start Time: 10:55 Pain Control: Lidocaine T Area Debrided (L x W): otal 2.5 (cm) x 1.5 (cm) = 3.75 (cm) Tissue and other material debrided: Viable, Non-Viable, Bone, Muscle, Slough, Subcutaneous, Tendon, Skin: Dermis , Skin: Epidermis, Slough Level: Skin/Subcutaneous Tissue/Muscle/Bone Debridement Description: Excisional Instrument: Forceps, Rongeur Specimen: Tissue Culture Number of Specimens T aken: 1 Bleeding: Moderate Hemostasis Achieved: Silver Nitrate End Time: 10:56 Procedural Pain: 0 Post Procedural Pain: 0 Response to Treatment: Procedure was tolerated well Level of Consciousness (Post- Awake and Alert procedure): Post Debridement Measurements of Total Wound Length: (cm) 2.5 Width: (cm) 1.5 Depth: (cm) 1.1 Volume: (cm) 3.24 Character of Wound/Ulcer Post Debridement: Improved Severity of Tissue Post Debridement: Bone involvement without necrosis Post Procedure Diagnosis Same as Pre-procedure Notes bone culture taken Electronic Signature(s) Signed: 08/23/2020 4:28:01 PM By: [name] Ham MD Signed: 08/26/2020 3:14:58 PM By: [name] Hammock RN Entered By: [name] [name] on 08/23/2020  12:54:34 -------------------------------------------------------------------------------- HPI Details Patient Name: Date of Service: [name] [name] F. 08/23/2020 9:30 A M Medical Record Number: [id number] Patient Account Number: [id number] Date of Birth/Sex: Treating RN: March 23, 1946 (75 y.o. Male) [name] [name] Primary Care Provider: Other Clinician: [name], [name] Referring Provider: Treating Provider/Extender: Lynlee Stratton [name] [name] [name] [name] in Treatment: 227 History of Present Illness Location: On the left and right lateral forefoot which has been there for about 6 months Quality: Patient reports No Pain. Severity: Patient states wound(s) are getting worse. Duration: Patient has had the wound for > 6 months prior to seeking treatment at the wound center Context: The wound would happen gradually Modifying Factors: Patient wound(s)/ulcer(s) are worsening due to :continual drainage from the wound ssociated Signs and Symptoms: Patient reports having increase discharge. A HPI Description: This patient returns after being seen here till the end of August and he was lost to follow-up. he has been quite debilitated laying in bed most of the time and his condition has deteriorated significantly. He has multiple ulcerations on the heel lateral forefoot and some of his toes. ======== Old notes: 76 year old male known to our practice when he was seen here in February and March and was lost to follow-up when he was admitted to hospital with various medical problems including coronary artery disease and a stroke. Now returns with the problem on the left forefoot where he has an ulceration and this has been there for about 6 months. most recently he was in hospital between July 6 and July 16, when he was admitted and treated for acute respiratory failure is secondary to aspiration pneumonia, large non-STEMI, ischemic cardiomyopathy with systolic and diastolic congestive heart failure with  ejection fraction about 15-20%, ventricular tachycardia and has been treated with amiodarone, acute on careful up at the common new acute CVA, acute chronic kidney disease stage III, anemia, uncontrolled diabetes mellitus with last hemoglobin A1c being 12%. He has had persistent hyperglycemia given recently. Patient has a past medical history  VAL, SCHIAVO ([id number]) Visit Report for 08/23/2020 Debridement Details Patient Name: Date of Service: [name] [name] [facility] F. 08/23/2020 9:30 A M Medical Record Number: [id number] Patient Account Number: [id number] Date of Birth/Sex: Treating RN: [birth year] (75 y.o. Male) [name] [name] Primary Care Provider: [name], [name] Other Clinician: Referring Provider: Treating Provider/Extender: [name] [name] [name] [name] [name] [name] in Treatment: 227 Debridement Performed for Assessment: Wound #36 Left,Lateral Foot Performed By: Physician [name] [name]., MD Debridement Type: Debridement Severity of Tissue Pre Debridement: Bone involvement without necrosis Level of Consciousness (Pre-procedure): Awake and Alert Pre-procedure Verification/Time Out Yes - 10:55 Taken: Start Time: 10:55 Pain Control: Lidocaine T Area Debrided (L x W): otal 2.5 (cm) x 1.5 (cm) = 3.75 (cm) Tissue and other material debrided: Viable, Non-Viable, Bone, Muscle, Slough, Subcutaneous, Tendon, Skin: Dermis , Skin: Epidermis, Slough Level: Skin/Subcutaneous Tissue/Muscle/Bone Debridement Description: Excisional Instrument: Forceps, Rongeur Specimen: Tissue Culture Number of Specimens T aken: 1 Bleeding: Moderate Hemostasis Achieved: Silver Nitrate End Time: 10:56 Procedural Pain: 0 Post Procedural Pain: 0 Response to Treatment: Procedure was tolerated well Level of Consciousness (Post- Awake and Alert procedure): Post Debridement Measurements of Total Wound Length: (cm) 2.5 Width: (cm) 1.5 Depth: (cm) 1.1 Volume: (cm) 3.24 Character of Wound/Ulcer Post Debridement: Improved Severity of Tissue Post Debridement: Bone involvement without necrosis Post Procedure Diagnosis Same as Pre-procedure Notes bone culture taken Electronic Signature(s) Signed: 08/23/2020 4:28:01 PM By: [name] Ham MD Signed: 08/26/2020 3:14:58 PM By: [name] Hammock RN Entered By: [name] [name] on 08/23/2020  12:54:34 -------------------------------------------------------------------------------- HPI Details Patient Name: Date of Service: [name] [name] F. 08/23/2020 9:30 A M Medical Record Number: [id number] Patient Account Number: [id number] Date of Birth/Sex: Treating RN: March 23, 1946 (75 y.o. Male) [name] [name] Primary Care Provider: Other Clinician: [name], [name] Referring Provider: Treating Provider/Extender: Lynlee Stratton [name] [name] [name] [name] in Treatment: 227 History of Present Illness Location: On the left and right lateral forefoot which has been there for about 6 months Quality: Patient reports No Pain. Severity: Patient states wound(s) are getting worse. Duration: Patient has had the wound for > 6 months prior to seeking treatment at the wound center Context: The wound would happen gradually Modifying Factors: Patient wound(s)/ulcer(s) are worsening due to :continual drainage from the wound ssociated Signs and Symptoms: Patient reports having increase discharge. A HPI Description: This patient returns after being seen here till the end of August and he was lost to follow-up. he has been quite debilitated laying in bed most of the time and his condition has deteriorated significantly. He has multiple ulcerations on the heel lateral forefoot and some of his toes. ======== Old notes: 76 year old male known to our practice when he was seen here in February and March and was lost to follow-up when he was admitted to hospital with various medical problems including coronary artery disease and a stroke. Now returns with the problem on the left forefoot where he has an ulceration and this has been there for about 6 months. most recently he was in hospital between July 6 and July 16, when he was admitted and treated for acute respiratory failure is secondary to aspiration pneumonia, large non-STEMI, ischemic cardiomyopathy with systolic and diastolic congestive heart failure with  ejection fraction about 15-20%, ventricular tachycardia and has been treated with amiodarone, acute on careful up at the common new acute CVA, acute chronic kidney disease stage III, anemia, uncontrolled diabetes mellitus with last hemoglobin A1c being 12%. He has had persistent hyperglycemia given recently. Patient has a past medical history  Every Other Day/15 Days Discharge Instructions: Use triamcinolone 15 (g) as directed Peri-Wound Care: Sween Lotion (Moisturizing lotion) Every Other Day/15 Days Discharge Instructions: Apply moisturizing lotion as directed Prim Dressing: KerraCel Ag Gelling Fiber Dressing, 4x5 in (silver alginate) (Generic) Every Other Day/15 Days ary Discharge Instructions: Apply silver alginate to wound bed as instructed Secondary Dressing: Woven Gauze Sponge, Non-Sterile 4x4 in (Generic) Every Other Day/15 Days Discharge Instructions: Apply over primary dressing as directed. Secondary Dressing: ABD Pad, 5x9 (Generic) Every Other Day/15 Days Discharge Instructions: Apply over primary dressing as directed. Compression Wrap: Kerlix Roll 4.5x3.1 (in/yd) (Generic) Every Other Day/15 Days Discharge Instructions: Apply Kerlix and Coban compression as directed. Compression Wrap: Coban Self-Adherent Wrap 4x5 (in/yd) (Generic) Every Other Day/15 Days Discharge Instructions: Apply over Kerlix as directed. Wound #36 - Foot Wound Laterality: Left, Lateral Cleanser: Wound Cleanser (Generic) Every Other Day/15 Days Discharge Instructions: Cleanse the wound with wound cleanser prior to applying a clean dressing using gauze sponges, not tissue or cotton balls. Cleanser: Soap and Water Every Other Day/15 Days Discharge Instructions: May shower and wash wound with dial antibacterial soap and water prior to dressing change. Peri-Wound Care: Triamcinolone 15  (g) Every Other Day/15 Days Discharge Instructions: Use triamcinolone 15 (g) as directed Peri-Wound Care: Sween Lotion (Moisturizing lotion) Every Other Day/15 Days Discharge Instructions: Apply moisturizing lotion as directed Prim Dressing: KerraCel Ag Gelling Fiber Dressing, 4x5 in (silver alginate) (Generic) Every Other Day/15 Days ary Discharge Instructions: Apply silver alginate to wound bed as instructed Secondary Dressing: Woven Gauze Sponge, Non-Sterile 4x4 in (Generic) Every Other Day/15 Days Discharge Instructions: Apply over primary dressing as directed. Secondary Dressing: ABD Pad, 5x9 (Generic) Every Other Day/15 Days Discharge Instructions: Apply over primary dressing as directed. Compression Wrap: Kerlix Roll 4.5x3.1 (in/yd) (Generic) Every Other Day/15 Days Discharge Instructions: Apply Kerlix and Coban compression as directed. Compression Wrap: Coban Self-Adherent Wrap 4x5 (in/yd) (Generic) Every Other Day/15 Days Discharge Instructions: Apply over Kerlix as directed. Laboratory naerobe culture (MICRO) - PCR bone culture Bacteria identified in Unspecified specimen by A LOINC Code: 950-9 Convenience Name: Anerobic culture Electronic Signature(s) Signed: 08/25/2020 5:06:47 PM By: [name] Ham MD Signed: 08/26/2020 3:14:58 PM By: [name] Hammock RN Previous Signature: 08/23/2020 4:28:01 PM Version By: [name] Ham MD Entered By: [name] [name] on 08/24/2020 07:54:56 -------------------------------------------------------------------------------- Problem List Details Patient Name: Date of Service: [name] [name] F. 08/23/2020 9:30 A M Medical Record Number: [id number] Patient Account Number: [id number] Date of Birth/Sex: Treating RN: Jan 06, 1946 (75 y.o. Male) [name] [name] Primary Care Provider: [name], [name] Other Clinician: Referring Provider: Treating Provider/Extender: Lanee Chain [name] [name] [name] [name] in Treatment: 227 Active  Problems ICD-10 Encounter Code Description Active Date MDM Diagnosis E11.621 Type 2 diabetes mellitus with foot ulcer 04/17/2016 No Yes L97.518 Non-pressure chronic ulcer of other part of right foot with other specified 05/17/2020 No Yes severity L97.221 Non-pressure chronic ulcer of left calf limited to breakdown of skin 08/15/2017 No Yes L97.321 Non-pressure chronic ulcer of left ankle limited to breakdown of skin 07/01/2018 No Yes L97.521 Non-pressure chronic ulcer of other part of left foot limited to breakdown of 02/17/2019 No Yes skin L97.524 Non-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 06/14/2020 No Yes Inactive Problems ICD-10 Code Description Active Date Inactive Date L89.613 Pressure ulcer of right heel, stage 3 04/17/2016 04/17/2016 L03.032 Cellulitis of left toe 01/06/2019 01/06/2019 L89.623 Pressure ulcer of left heel, stage 3 12/04/2016 12/04/2016 I25.119 Atherosclerotic heart disease of native coronary artery with unspecified  challenge such as micro emboli or vasculitis etc. 4/19; Patient presents for 2-week follow-up. He has no complaints today. He finished his course of antibiotics scheduled at last visit for a culture that grew moderate strep group B. He has no new wounds today. His daughter continues to do his dressings 5/3; 2-week follow-up. Since he was last here he had a CT scan of his left foot that suggested osteomyelitis over the fifth MTP joint which is  unfortunately what we expected to see. His daughter had called in late last week to say there was an odor and exposed bone. I did not appreciate an odor and there was no exposed bone but there certainly was a necrotic surface. He also has areas on the left dorsal ankle right dorsal ankle left lateral lower leg He also saw Dr. Loletta Specter on 4/26. 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 He had a CO2 angiogram mostly had a brisk peroneal signal. It was felt that he would only have an option of an amputation from vascular surgery point of view. The patient and his daughter are very opposed to this although he is only minimally ambulatory Electronic Signature(s) Signed: 08/23/2020 4:28:01 PM By: [name] Ham MD Entered By: [name] [name] on 08/23/2020 13:02:39 -------------------------------------------------------------------------------- Physical Exam Details Patient Name: Date of Service: [name] [name] F. 08/23/2020 9:30 [place] Record Number: [id number] Patient Account Number: [id number] Date of Birth/Sex: Treating RN: Jan 25, 1946 (75 y.o. Male) [name] [name] Primary Care Provider: [name], [name] Other Clinician: Referring Provider: Treating Provider/Extender: [name] [name] [name] [name] [name] [name] in Treatment: 227 Notes Wound exam; Right lower extremity he has an area on the dorsal foot. On the left leg he has the original wound on the left lateral leg at the level of the tibia, left dorsal foot and ankle. The new wound from 2 [name] [name] ago over the left fifth met head is necrotic but there was no exposed bone. I remove this necrotic material underlying tendon to get access to bone I used rongeurs to obtain a specimen of bone for PCR culture Electronic Signature(s) Signed: 08/23/2020 4:28:01 PM By: [name] Ham MD Entered By: [name] [name] on 08/23/2020 13:00:18 -------------------------------------------------------------------------------- Physician  Orders Details Patient Name: Date of Service: [name] [name] F. 08/23/2020 9:30 A M Medical Record Number: [id number] Patient Account Number: [id number] Date of Birth/Sex: Treating RN: 02/09/1946 (75 y.o. Male) [name] [name] Primary Care Provider: [name], [name] Other Clinician: Referring Provider: Treating Provider/Extender: [name] [name] [name] [name] [name] [name] in Treatment: 227 Verbal / Phone Orders: No Diagnosis Coding Follow-up Appointments ppointment in 1 week. - Dr. Dellia [name] Return A Bathing/ Shower/ Hygiene May shower with protection but do not get wound dressing(s) wet. Edema Control - Lymphedema / SCD / Other Elevate legs to the level of the heart or above for 30 minutes daily and/or when sitting, a frequency of: Avoid standing for long periods of time. Off-Loading Other: - multipodus boots to both feet Wound Treatment Wound #14 - Lower Leg Wound Laterality: Left, Lateral Cleanser: Wound Cleanser (Generic) Every Other Day/15 Days Discharge Instructions: Cleanse the wound with wound cleanser prior to applying a clean dressing using gauze sponges, not tissue or cotton balls. Cleanser: Soap and Water Every Other Day/15 Days Discharge Instructions: May shower and wash wound with dial antibacterial soap and water prior to dressing change. Peri-Wound Care: Triamcinolone 15 (g) Every Other Day/15 Days Discharge Instructions: Use triamcinolone 15 (g) as directed Peri-Wound Care: Sween Lotion (Moisturizing lotion) Every Other  Every Other Day/15 Days Discharge Instructions: Use triamcinolone 15 (g) as directed Peri-Wound Care: Sween Lotion (Moisturizing lotion) Every Other Day/15 Days Discharge Instructions: Apply moisturizing lotion as directed Prim Dressing: KerraCel Ag Gelling Fiber Dressing, 4x5 in (silver alginate) (Generic) Every Other Day/15 Days ary Discharge Instructions: Apply silver alginate to wound bed as instructed Secondary Dressing: Woven Gauze Sponge, Non-Sterile 4x4 in (Generic) Every Other Day/15 Days Discharge Instructions: Apply over primary dressing as directed. Secondary Dressing: ABD Pad, 5x9 (Generic) Every Other Day/15 Days Discharge Instructions: Apply over primary dressing as directed. Compression Wrap: Kerlix Roll 4.5x3.1 (in/yd) (Generic) Every Other Day/15 Days Discharge Instructions: Apply Kerlix and Coban compression as directed. Compression Wrap: Coban Self-Adherent Wrap 4x5 (in/yd) (Generic) Every Other Day/15 Days Discharge Instructions: Apply over Kerlix as directed. Wound #36 - Foot Wound Laterality: Left, Lateral Cleanser: Wound Cleanser (Generic) Every Other Day/15 Days Discharge Instructions: Cleanse the wound with wound cleanser prior to applying a clean dressing using gauze sponges, not tissue or cotton balls. Cleanser: Soap and Water Every Other Day/15 Days Discharge Instructions: May shower and wash wound with dial antibacterial soap and water prior to dressing change. Peri-Wound Care: Triamcinolone 15  (g) Every Other Day/15 Days Discharge Instructions: Use triamcinolone 15 (g) as directed Peri-Wound Care: Sween Lotion (Moisturizing lotion) Every Other Day/15 Days Discharge Instructions: Apply moisturizing lotion as directed Prim Dressing: KerraCel Ag Gelling Fiber Dressing, 4x5 in (silver alginate) (Generic) Every Other Day/15 Days ary Discharge Instructions: Apply silver alginate to wound bed as instructed Secondary Dressing: Woven Gauze Sponge, Non-Sterile 4x4 in (Generic) Every Other Day/15 Days Discharge Instructions: Apply over primary dressing as directed. Secondary Dressing: ABD Pad, 5x9 (Generic) Every Other Day/15 Days Discharge Instructions: Apply over primary dressing as directed. Compression Wrap: Kerlix Roll 4.5x3.1 (in/yd) (Generic) Every Other Day/15 Days Discharge Instructions: Apply Kerlix and Coban compression as directed. Compression Wrap: Coban Self-Adherent Wrap 4x5 (in/yd) (Generic) Every Other Day/15 Days Discharge Instructions: Apply over Kerlix as directed. Laboratory naerobe culture (MICRO) - PCR bone culture Bacteria identified in Unspecified specimen by A LOINC Code: 950-9 Convenience Name: Anerobic culture Electronic Signature(s) Signed: 08/25/2020 5:06:47 PM By: [name] Ham MD Signed: 08/26/2020 3:14:58 PM By: [name] Hammock RN Previous Signature: 08/23/2020 4:28:01 PM Version By: [name] Ham MD Entered By: [name] [name] on 08/24/2020 07:54:56 -------------------------------------------------------------------------------- Problem List Details Patient Name: Date of Service: [name] [name] F. 08/23/2020 9:30 A M Medical Record Number: [id number] Patient Account Number: [id number] Date of Birth/Sex: Treating RN: Jan 06, 1946 (75 y.o. Male) [name] [name] Primary Care Provider: [name], [name] Other Clinician: Referring Provider: Treating Provider/Extender: Lanee Chain [name] [name] [name] [name] in Treatment: 227 Active  Problems ICD-10 Encounter Code Description Active Date MDM Diagnosis E11.621 Type 2 diabetes mellitus with foot ulcer 04/17/2016 No Yes L97.518 Non-pressure chronic ulcer of other part of right foot with other specified 05/17/2020 No Yes severity L97.221 Non-pressure chronic ulcer of left calf limited to breakdown of skin 08/15/2017 No Yes L97.321 Non-pressure chronic ulcer of left ankle limited to breakdown of skin 07/01/2018 No Yes L97.521 Non-pressure chronic ulcer of other part of left foot limited to breakdown of 02/17/2019 No Yes skin L97.524 Non-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 06/14/2020 No Yes Inactive Problems ICD-10 Code Description Active Date Inactive Date L89.613 Pressure ulcer of right heel, stage 3 04/17/2016 04/17/2016 L03.032 Cellulitis of left toe 01/06/2019 01/06/2019 L89.623 Pressure ulcer of left heel, stage 3 12/04/2016 12/04/2016 I25.119 Atherosclerotic heart disease of native coronary artery with unspecified  VAL, SCHIAVO ([id number]) Visit Report for 08/23/2020 Debridement Details Patient Name: Date of Service: [name] [name] [facility] F. 08/23/2020 9:30 A M Medical Record Number: [id number] Patient Account Number: [id number] Date of Birth/Sex: Treating RN: [birth year] (75 y.o. Male) [name] [name] Primary Care Provider: [name], [name] Other Clinician: Referring Provider: Treating Provider/Extender: [name] [name] [name] [name] [name] [name] in Treatment: 227 Debridement Performed for Assessment: Wound #36 Left,Lateral Foot Performed By: Physician [name] [name]., MD Debridement Type: Debridement Severity of Tissue Pre Debridement: Bone involvement without necrosis Level of Consciousness (Pre-procedure): Awake and Alert Pre-procedure Verification/Time Out Yes - 10:55 Taken: Start Time: 10:55 Pain Control: Lidocaine T Area Debrided (L x W): otal 2.5 (cm) x 1.5 (cm) = 3.75 (cm) Tissue and other material debrided: Viable, Non-Viable, Bone, Muscle, Slough, Subcutaneous, Tendon, Skin: Dermis , Skin: Epidermis, Slough Level: Skin/Subcutaneous Tissue/Muscle/Bone Debridement Description: Excisional Instrument: Forceps, Rongeur Specimen: Tissue Culture Number of Specimens T aken: 1 Bleeding: Moderate Hemostasis Achieved: Silver Nitrate End Time: 10:56 Procedural Pain: 0 Post Procedural Pain: 0 Response to Treatment: Procedure was tolerated well Level of Consciousness (Post- Awake and Alert procedure): Post Debridement Measurements of Total Wound Length: (cm) 2.5 Width: (cm) 1.5 Depth: (cm) 1.1 Volume: (cm) 3.24 Character of Wound/Ulcer Post Debridement: Improved Severity of Tissue Post Debridement: Bone involvement without necrosis Post Procedure Diagnosis Same as Pre-procedure Notes bone culture taken Electronic Signature(s) Signed: 08/23/2020 4:28:01 PM By: [name] Ham MD Signed: 08/26/2020 3:14:58 PM By: [name] Hammock RN Entered By: [name] [name] on 08/23/2020  12:54:34 -------------------------------------------------------------------------------- HPI Details Patient Name: Date of Service: [name] [name] F. 08/23/2020 9:30 A M Medical Record Number: [id number] Patient Account Number: [id number] Date of Birth/Sex: Treating RN: March 23, 1946 (75 y.o. Male) [name] [name] Primary Care Provider: Other Clinician: [name], [name] Referring Provider: Treating Provider/Extender: Lynlee Stratton [name] [name] [name] [name] in Treatment: 227 History of Present Illness Location: On the left and right lateral forefoot which has been there for about 6 months Quality: Patient reports No Pain. Severity: Patient states wound(s) are getting worse. Duration: Patient has had the wound for > 6 months prior to seeking treatment at the wound center Context: The wound would happen gradually Modifying Factors: Patient wound(s)/ulcer(s) are worsening due to :continual drainage from the wound ssociated Signs and Symptoms: Patient reports having increase discharge. A HPI Description: This patient returns after being seen here till the end of August and he was lost to follow-up. he has been quite debilitated laying in bed most of the time and his condition has deteriorated significantly. He has multiple ulcerations on the heel lateral forefoot and some of his toes. ======== Old notes: 76 year old male known to our practice when he was seen here in February and March and was lost to follow-up when he was admitted to hospital with various medical problems including coronary artery disease and a stroke. Now returns with the problem on the left forefoot where he has an ulceration and this has been there for about 6 months. most recently he was in hospital between July 6 and July 16, when he was admitted and treated for acute respiratory failure is secondary to aspiration pneumonia, large non-STEMI, ischemic cardiomyopathy with systolic and diastolic congestive heart failure with  ejection fraction about 15-20%, ventricular tachycardia and has been treated with amiodarone, acute on careful up at the common new acute CVA, acute chronic kidney disease stage III, anemia, uncontrolled diabetes mellitus with last hemoglobin A1c being 12%. He has had persistent hyperglycemia given recently. Patient has a past medical history  Every Other Day/15 Days Discharge Instructions: Use triamcinolone 15 (g) as directed Peri-Wound Care: Sween Lotion (Moisturizing lotion) Every Other Day/15 Days Discharge Instructions: Apply moisturizing lotion as directed Prim Dressing: KerraCel Ag Gelling Fiber Dressing, 4x5 in (silver alginate) (Generic) Every Other Day/15 Days ary Discharge Instructions: Apply silver alginate to wound bed as instructed Secondary Dressing: Woven Gauze Sponge, Non-Sterile 4x4 in (Generic) Every Other Day/15 Days Discharge Instructions: Apply over primary dressing as directed. Secondary Dressing: ABD Pad, 5x9 (Generic) Every Other Day/15 Days Discharge Instructions: Apply over primary dressing as directed. Compression Wrap: Kerlix Roll 4.5x3.1 (in/yd) (Generic) Every Other Day/15 Days Discharge Instructions: Apply Kerlix and Coban compression as directed. Compression Wrap: Coban Self-Adherent Wrap 4x5 (in/yd) (Generic) Every Other Day/15 Days Discharge Instructions: Apply over Kerlix as directed. Wound #36 - Foot Wound Laterality: Left, Lateral Cleanser: Wound Cleanser (Generic) Every Other Day/15 Days Discharge Instructions: Cleanse the wound with wound cleanser prior to applying a clean dressing using gauze sponges, not tissue or cotton balls. Cleanser: Soap and Water Every Other Day/15 Days Discharge Instructions: May shower and wash wound with dial antibacterial soap and water prior to dressing change. Peri-Wound Care: Triamcinolone 15  (g) Every Other Day/15 Days Discharge Instructions: Use triamcinolone 15 (g) as directed Peri-Wound Care: Sween Lotion (Moisturizing lotion) Every Other Day/15 Days Discharge Instructions: Apply moisturizing lotion as directed Prim Dressing: KerraCel Ag Gelling Fiber Dressing, 4x5 in (silver alginate) (Generic) Every Other Day/15 Days ary Discharge Instructions: Apply silver alginate to wound bed as instructed Secondary Dressing: Woven Gauze Sponge, Non-Sterile 4x4 in (Generic) Every Other Day/15 Days Discharge Instructions: Apply over primary dressing as directed. Secondary Dressing: ABD Pad, 5x9 (Generic) Every Other Day/15 Days Discharge Instructions: Apply over primary dressing as directed. Compression Wrap: Kerlix Roll 4.5x3.1 (in/yd) (Generic) Every Other Day/15 Days Discharge Instructions: Apply Kerlix and Coban compression as directed. Compression Wrap: Coban Self-Adherent Wrap 4x5 (in/yd) (Generic) Every Other Day/15 Days Discharge Instructions: Apply over Kerlix as directed. Laboratory naerobe culture (MICRO) - PCR bone culture Bacteria identified in Unspecified specimen by A LOINC Code: 950-9 Convenience Name: Anerobic culture Electronic Signature(s) Signed: 08/25/2020 5:06:47 PM By: [name] Ham MD Signed: 08/26/2020 3:14:58 PM By: [name] Hammock RN Previous Signature: 08/23/2020 4:28:01 PM Version By: [name] Ham MD Entered By: [name] [name] on 08/24/2020 07:54:56 -------------------------------------------------------------------------------- Problem List Details Patient Name: Date of Service: [name] [name] F. 08/23/2020 9:30 A M Medical Record Number: [id number] Patient Account Number: [id number] Date of Birth/Sex: Treating RN: Jan 06, 1946 (75 y.o. Male) [name] [name] Primary Care Provider: [name], [name] Other Clinician: Referring Provider: Treating Provider/Extender: Lanee Chain [name] [name] [name] [name] in Treatment: 227 Active  Problems ICD-10 Encounter Code Description Active Date MDM Diagnosis E11.621 Type 2 diabetes mellitus with foot ulcer 04/17/2016 No Yes L97.518 Non-pressure chronic ulcer of other part of right foot with other specified 05/17/2020 No Yes severity L97.221 Non-pressure chronic ulcer of left calf limited to breakdown of skin 08/15/2017 No Yes L97.321 Non-pressure chronic ulcer of left ankle limited to breakdown of skin 07/01/2018 No Yes L97.521 Non-pressure chronic ulcer of other part of left foot limited to breakdown of 02/17/2019 No Yes skin L97.524 Non-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 06/14/2020 No Yes Inactive Problems ICD-10 Code Description Active Date Inactive Date L89.613 Pressure ulcer of right heel, stage 3 04/17/2016 04/17/2016 L03.032 Cellulitis of left toe 01/06/2019 01/06/2019 L89.623 Pressure ulcer of left heel, stage 3 12/04/2016 12/04/2016 I25.119 Atherosclerotic heart disease of native coronary artery with unspecified  Every Other Day/15 Days Discharge Instructions: Use triamcinolone 15 (g) as directed Peri-Wound Care: Sween Lotion (Moisturizing lotion) Every Other Day/15 Days Discharge Instructions: Apply moisturizing lotion as directed Prim Dressing: KerraCel Ag Gelling Fiber Dressing, 4x5 in (silver alginate) (Generic) Every Other Day/15 Days ary Discharge Instructions: Apply silver alginate to wound bed as instructed Secondary Dressing: Woven Gauze Sponge, Non-Sterile 4x4 in (Generic) Every Other Day/15 Days Discharge Instructions: Apply over primary dressing as directed. Secondary Dressing: ABD Pad, 5x9 (Generic) Every Other Day/15 Days Discharge Instructions: Apply over primary dressing as directed. Compression Wrap: Kerlix Roll 4.5x3.1 (in/yd) (Generic) Every Other Day/15 Days Discharge Instructions: Apply Kerlix and Coban compression as directed. Compression Wrap: Coban Self-Adherent Wrap 4x5 (in/yd) (Generic) Every Other Day/15 Days Discharge Instructions: Apply over Kerlix as directed. Wound #36 - Foot Wound Laterality: Left, Lateral Cleanser: Wound Cleanser (Generic) Every Other Day/15 Days Discharge Instructions: Cleanse the wound with wound cleanser prior to applying a clean dressing using gauze sponges, not tissue or cotton balls. Cleanser: Soap and Water Every Other Day/15 Days Discharge Instructions: May shower and wash wound with dial antibacterial soap and water prior to dressing change. Peri-Wound Care: Triamcinolone 15  (g) Every Other Day/15 Days Discharge Instructions: Use triamcinolone 15 (g) as directed Peri-Wound Care: Sween Lotion (Moisturizing lotion) Every Other Day/15 Days Discharge Instructions: Apply moisturizing lotion as directed Prim Dressing: KerraCel Ag Gelling Fiber Dressing, 4x5 in (silver alginate) (Generic) Every Other Day/15 Days ary Discharge Instructions: Apply silver alginate to wound bed as instructed Secondary Dressing: Woven Gauze Sponge, Non-Sterile 4x4 in (Generic) Every Other Day/15 Days Discharge Instructions: Apply over primary dressing as directed. Secondary Dressing: ABD Pad, 5x9 (Generic) Every Other Day/15 Days Discharge Instructions: Apply over primary dressing as directed. Compression Wrap: Kerlix Roll 4.5x3.1 (in/yd) (Generic) Every Other Day/15 Days Discharge Instructions: Apply Kerlix and Coban compression as directed. Compression Wrap: Coban Self-Adherent Wrap 4x5 (in/yd) (Generic) Every Other Day/15 Days Discharge Instructions: Apply over Kerlix as directed. Laboratory naerobe culture (MICRO) - PCR bone culture Bacteria identified in Unspecified specimen by A LOINC Code: 950-9 Convenience Name: Anerobic culture Electronic Signature(s) Signed: 08/25/2020 5:06:47 PM By: [name] Ham MD Signed: 08/26/2020 3:14:58 PM By: [name] Hammock RN Previous Signature: 08/23/2020 4:28:01 PM Version By: [name] Ham MD Entered By: [name] [name] on 08/24/2020 07:54:56 -------------------------------------------------------------------------------- Problem List Details Patient Name: Date of Service: [name] [name] F. 08/23/2020 9:30 A M Medical Record Number: [id number] Patient Account Number: [id number] Date of Birth/Sex: Treating RN: Jan 06, 1946 (75 y.o. Male) [name] [name] Primary Care Provider: [name], [name] Other Clinician: Referring Provider: Treating Provider/Extender: Lanee Chain [name] [name] [name] [name] in Treatment: 227 Active  Problems ICD-10 Encounter Code Description Active Date MDM Diagnosis E11.621 Type 2 diabetes mellitus with foot ulcer 04/17/2016 No Yes L97.518 Non-pressure chronic ulcer of other part of right foot with other specified 05/17/2020 No Yes severity L97.221 Non-pressure chronic ulcer of left calf limited to breakdown of skin 08/15/2017 No Yes L97.321 Non-pressure chronic ulcer of left ankle limited to breakdown of skin 07/01/2018 No Yes L97.521 Non-pressure chronic ulcer of other part of left foot limited to breakdown of 02/17/2019 No Yes skin L97.524 Non-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 06/14/2020 No Yes Inactive Problems ICD-10 Code Description Active Date Inactive Date L89.613 Pressure ulcer of right heel, stage 3 04/17/2016 04/17/2016 L03.032 Cellulitis of left toe 01/06/2019 01/06/2019 L89.623 Pressure ulcer of left heel, stage 3 12/04/2016 12/04/2016 I25.119 Atherosclerotic heart disease of native coronary artery with unspecified  challenge such as micro emboli or vasculitis etc. 4/19; Patient presents for 2-week follow-up. He has no complaints today. He finished his course of antibiotics scheduled at last visit for a culture that grew moderate strep group B. He has no new wounds today. His daughter continues to do his dressings 5/3; 2-week follow-up. Since he was last here he had a CT scan of his left foot that suggested osteomyelitis over the fifth MTP joint which is  unfortunately what we expected to see. His daughter had called in late last week to say there was an odor and exposed bone. I did not appreciate an odor and there was no exposed bone but there certainly was a necrotic surface. He also has areas on the left dorsal ankle right dorsal ankle left lateral lower leg He also saw Dr. Loletta Specter on 4/26. 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 He had a CO2 angiogram mostly had a brisk peroneal signal. It was felt that he would only have an option of an amputation from vascular surgery point of view. The patient and his daughter are very opposed to this although he is only minimally ambulatory Electronic Signature(s) Signed: 08/23/2020 4:28:01 PM By: [name] Ham MD Entered By: [name] [name] on 08/23/2020 13:02:39 -------------------------------------------------------------------------------- Physical Exam Details Patient Name: Date of Service: [name] [name] F. 08/23/2020 9:30 [place] Record Number: [id number] Patient Account Number: [id number] Date of Birth/Sex: Treating RN: Jan 25, 1946 (75 y.o. Male) [name] [name] Primary Care Provider: [name], [name] Other Clinician: Referring Provider: Treating Provider/Extender: [name] [name] [name] [name] [name] [name] in Treatment: 227 Notes Wound exam; Right lower extremity he has an area on the dorsal foot. On the left leg he has the original wound on the left lateral leg at the level of the tibia, left dorsal foot and ankle. The new wound from 2 [name] [name] ago over the left fifth met head is necrotic but there was no exposed bone. I remove this necrotic material underlying tendon to get access to bone I used rongeurs to obtain a specimen of bone for PCR culture Electronic Signature(s) Signed: 08/23/2020 4:28:01 PM By: [name] Ham MD Entered By: [name] [name] on 08/23/2020 13:00:18 -------------------------------------------------------------------------------- Physician  Orders Details Patient Name: Date of Service: [name] [name] F. 08/23/2020 9:30 A M Medical Record Number: [id number] Patient Account Number: [id number] Date of Birth/Sex: Treating RN: 02/09/1946 (75 y.o. Male) [name] [name] Primary Care Provider: [name], [name] Other Clinician: Referring Provider: Treating Provider/Extender: [name] [name] [name] [name] [name] [name] in Treatment: 227 Verbal / Phone Orders: No Diagnosis Coding Follow-up Appointments ppointment in 1 week. - Dr. Dellia [name] Return A Bathing/ Shower/ Hygiene May shower with protection but do not get wound dressing(s) wet. Edema Control - Lymphedema / SCD / Other Elevate legs to the level of the heart or above for 30 minutes daily and/or when sitting, a frequency of: Avoid standing for long periods of time. Off-Loading Other: - multipodus boots to both feet Wound Treatment Wound #14 - Lower Leg Wound Laterality: Left, Lateral Cleanser: Wound Cleanser (Generic) Every Other Day/15 Days Discharge Instructions: Cleanse the wound with wound cleanser prior to applying a clean dressing using gauze sponges, not tissue or cotton balls. Cleanser: Soap and Water Every Other Day/15 Days Discharge Instructions: May shower and wash wound with dial antibacterial soap and water prior to dressing change. Peri-Wound Care: Triamcinolone 15 (g) Every Other Day/15 Days Discharge Instructions: Use triamcinolone 15 (g) as directed Peri-Wound Care: Sween Lotion (Moisturizing lotion) Every Other  challenge such as micro emboli or vasculitis etc. 4/19; Patient presents for 2-week follow-up. He has no complaints today. He finished his course of antibiotics scheduled at last visit for a culture that grew moderate strep group B. He has no new wounds today. His daughter continues to do his dressings 5/3; 2-week follow-up. Since he was last here he had a CT scan of his left foot that suggested osteomyelitis over the fifth MTP joint which is  unfortunately what we expected to see. His daughter had called in late last week to say there was an odor and exposed bone. I did not appreciate an odor and there was no exposed bone but there certainly was a necrotic surface. He also has areas on the left dorsal ankle right dorsal ankle left lateral lower leg He also saw Dr. Loletta Specter on 4/26. 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 He had a CO2 angiogram mostly had a brisk peroneal signal. It was felt that he would only have an option of an amputation from vascular surgery point of view. The patient and his daughter are very opposed to this although he is only minimally ambulatory Electronic Signature(s) Signed: 08/23/2020 4:28:01 PM By: [name] Ham MD Entered By: [name] [name] on 08/23/2020 13:02:39 -------------------------------------------------------------------------------- Physical Exam Details Patient Name: Date of Service: [name] [name] F. 08/23/2020 9:30 [place] Record Number: [id number] Patient Account Number: [id number] Date of Birth/Sex: Treating RN: Jan 25, 1946 (75 y.o. Male) [name] [name] Primary Care Provider: [name], [name] Other Clinician: Referring Provider: Treating Provider/Extender: [name] [name] [name] [name] [name] [name] in Treatment: 227 Notes Wound exam; Right lower extremity he has an area on the dorsal foot. On the left leg he has the original wound on the left lateral leg at the level of the tibia, left dorsal foot and ankle. The new wound from 2 [name] [name] ago over the left fifth met head is necrotic but there was no exposed bone. I remove this necrotic material underlying tendon to get access to bone I used rongeurs to obtain a specimen of bone for PCR culture Electronic Signature(s) Signed: 08/23/2020 4:28:01 PM By: [name] Ham MD Entered By: [name] [name] on 08/23/2020 13:00:18 -------------------------------------------------------------------------------- Physician  Orders Details Patient Name: Date of Service: [name] [name] F. 08/23/2020 9:30 A M Medical Record Number: [id number] Patient Account Number: [id number] Date of Birth/Sex: Treating RN: 02/09/1946 (75 y.o. Male) [name] [name] Primary Care Provider: [name], [name] Other Clinician: Referring Provider: Treating Provider/Extender: [name] [name] [name] [name] [name] [name] in Treatment: 227 Verbal / Phone Orders: No Diagnosis Coding Follow-up Appointments ppointment in 1 week. - Dr. Dellia [name] Return A Bathing/ Shower/ Hygiene May shower with protection but do not get wound dressing(s) wet. Edema Control - Lymphedema / SCD / Other Elevate legs to the level of the heart or above for 30 minutes daily and/or when sitting, a frequency of: Avoid standing for long periods of time. Off-Loading Other: - multipodus boots to both feet Wound Treatment Wound #14 - Lower Leg Wound Laterality: Left, Lateral Cleanser: Wound Cleanser (Generic) Every Other Day/15 Days Discharge Instructions: Cleanse the wound with wound cleanser prior to applying a clean dressing using gauze sponges, not tissue or cotton balls. Cleanser: Soap and Water Every Other Day/15 Days Discharge Instructions: May shower and wash wound with dial antibacterial soap and water prior to dressing change. Peri-Wound Care: Triamcinolone 15 (g) Every Other Day/15 Days Discharge Instructions: Use triamcinolone 15 (g) as directed Peri-Wound Care: Sween Lotion (Moisturizing lotion) Every Other  VAL, SCHIAVO ([id number]) Visit Report for 08/23/2020 Debridement Details Patient Name: Date of Service: [name] [name] [facility] F. 08/23/2020 9:30 A M Medical Record Number: [id number] Patient Account Number: [id number] Date of Birth/Sex: Treating RN: [birth year] (75 y.o. Male) [name] [name] Primary Care Provider: [name], [name] Other Clinician: Referring Provider: Treating Provider/Extender: [name] [name] [name] [name] [name] [name] in Treatment: 227 Debridement Performed for Assessment: Wound #36 Left,Lateral Foot Performed By: Physician [name] [name]., MD Debridement Type: Debridement Severity of Tissue Pre Debridement: Bone involvement without necrosis Level of Consciousness (Pre-procedure): Awake and Alert Pre-procedure Verification/Time Out Yes - 10:55 Taken: Start Time: 10:55 Pain Control: Lidocaine T Area Debrided (L x W): otal 2.5 (cm) x 1.5 (cm) = 3.75 (cm) Tissue and other material debrided: Viable, Non-Viable, Bone, Muscle, Slough, Subcutaneous, Tendon, Skin: Dermis , Skin: Epidermis, Slough Level: Skin/Subcutaneous Tissue/Muscle/Bone Debridement Description: Excisional Instrument: Forceps, Rongeur Specimen: Tissue Culture Number of Specimens T aken: 1 Bleeding: Moderate Hemostasis Achieved: Silver Nitrate End Time: 10:56 Procedural Pain: 0 Post Procedural Pain: 0 Response to Treatment: Procedure was tolerated well Level of Consciousness (Post- Awake and Alert procedure): Post Debridement Measurements of Total Wound Length: (cm) 2.5 Width: (cm) 1.5 Depth: (cm) 1.1 Volume: (cm) 3.24 Character of Wound/Ulcer Post Debridement: Improved Severity of Tissue Post Debridement: Bone involvement without necrosis Post Procedure Diagnosis Same as Pre-procedure Notes bone culture taken Electronic Signature(s) Signed: 08/23/2020 4:28:01 PM By: [name] Ham MD Signed: 08/26/2020 3:14:58 PM By: [name] Hammock RN Entered By: [name] [name] on 08/23/2020  12:54:34 -------------------------------------------------------------------------------- HPI Details Patient Name: Date of Service: [name] [name] F. 08/23/2020 9:30 A M Medical Record Number: [id number] Patient Account Number: [id number] Date of Birth/Sex: Treating RN: March 23, 1946 (75 y.o. Male) [name] [name] Primary Care Provider: Other Clinician: [name], [name] Referring Provider: Treating Provider/Extender: Lynlee Stratton [name] [name] [name] [name] in Treatment: 227 History of Present Illness Location: On the left and right lateral forefoot which has been there for about 6 months Quality: Patient reports No Pain. Severity: Patient states wound(s) are getting worse. Duration: Patient has had the wound for > 6 months prior to seeking treatment at the wound center Context: The wound would happen gradually Modifying Factors: Patient wound(s)/ulcer(s) are worsening due to :continual drainage from the wound ssociated Signs and Symptoms: Patient reports having increase discharge. A HPI Description: This patient returns after being seen here till the end of August and he was lost to follow-up. he has been quite debilitated laying in bed most of the time and his condition has deteriorated significantly. He has multiple ulcerations on the heel lateral forefoot and some of his toes. ======== Old notes: 76 year old male known to our practice when he was seen here in February and March and was lost to follow-up when he was admitted to hospital with various medical problems including coronary artery disease and a stroke. Now returns with the problem on the left forefoot where he has an ulceration and this has been there for about 6 months. most recently he was in hospital between July 6 and July 16, when he was admitted and treated for acute respiratory failure is secondary to aspiration pneumonia, large non-STEMI, ischemic cardiomyopathy with systolic and diastolic congestive heart failure with  ejection fraction about 15-20%, ventricular tachycardia and has been treated with amiodarone, acute on careful up at the common new acute CVA, acute chronic kidney disease stage III, anemia, uncontrolled diabetes mellitus with last hemoglobin A1c being 12%. He has had persistent hyperglycemia given recently. Patient has a past medical history  Every Other Day/15 Days Discharge Instructions: Use triamcinolone 15 (g) as directed Peri-Wound Care: Sween Lotion (Moisturizing lotion) Every Other Day/15 Days Discharge Instructions: Apply moisturizing lotion as directed Prim Dressing: KerraCel Ag Gelling Fiber Dressing, 4x5 in (silver alginate) (Generic) Every Other Day/15 Days ary Discharge Instructions: Apply silver alginate to wound bed as instructed Secondary Dressing: Woven Gauze Sponge, Non-Sterile 4x4 in (Generic) Every Other Day/15 Days Discharge Instructions: Apply over primary dressing as directed. Secondary Dressing: ABD Pad, 5x9 (Generic) Every Other Day/15 Days Discharge Instructions: Apply over primary dressing as directed. Compression Wrap: Kerlix Roll 4.5x3.1 (in/yd) (Generic) Every Other Day/15 Days Discharge Instructions: Apply Kerlix and Coban compression as directed. Compression Wrap: Coban Self-Adherent Wrap 4x5 (in/yd) (Generic) Every Other Day/15 Days Discharge Instructions: Apply over Kerlix as directed. Wound #36 - Foot Wound Laterality: Left, Lateral Cleanser: Wound Cleanser (Generic) Every Other Day/15 Days Discharge Instructions: Cleanse the wound with wound cleanser prior to applying a clean dressing using gauze sponges, not tissue or cotton balls. Cleanser: Soap and Water Every Other Day/15 Days Discharge Instructions: May shower and wash wound with dial antibacterial soap and water prior to dressing change. Peri-Wound Care: Triamcinolone 15  (g) Every Other Day/15 Days Discharge Instructions: Use triamcinolone 15 (g) as directed Peri-Wound Care: Sween Lotion (Moisturizing lotion) Every Other Day/15 Days Discharge Instructions: Apply moisturizing lotion as directed Prim Dressing: KerraCel Ag Gelling Fiber Dressing, 4x5 in (silver alginate) (Generic) Every Other Day/15 Days ary Discharge Instructions: Apply silver alginate to wound bed as instructed Secondary Dressing: Woven Gauze Sponge, Non-Sterile 4x4 in (Generic) Every Other Day/15 Days Discharge Instructions: Apply over primary dressing as directed. Secondary Dressing: ABD Pad, 5x9 (Generic) Every Other Day/15 Days Discharge Instructions: Apply over primary dressing as directed. Compression Wrap: Kerlix Roll 4.5x3.1 (in/yd) (Generic) Every Other Day/15 Days Discharge Instructions: Apply Kerlix and Coban compression as directed. Compression Wrap: Coban Self-Adherent Wrap 4x5 (in/yd) (Generic) Every Other Day/15 Days Discharge Instructions: Apply over Kerlix as directed. Laboratory naerobe culture (MICRO) - PCR bone culture Bacteria identified in Unspecified specimen by A LOINC Code: 950-9 Convenience Name: Anerobic culture Electronic Signature(s) Signed: 08/25/2020 5:06:47 PM By: [name] Ham MD Signed: 08/26/2020 3:14:58 PM By: [name] Hammock RN Previous Signature: 08/23/2020 4:28:01 PM Version By: [name] Ham MD Entered By: [name] [name] on 08/24/2020 07:54:56 -------------------------------------------------------------------------------- Problem List Details Patient Name: Date of Service: [name] [name] F. 08/23/2020 9:30 A M Medical Record Number: [id number] Patient Account Number: [id number] Date of Birth/Sex: Treating RN: Jan 06, 1946 (75 y.o. Male) [name] [name] Primary Care Provider: [name], [name] Other Clinician: Referring Provider: Treating Provider/Extender: Lanee Chain [name] [name] [name] [name] in Treatment: 227 Active  Problems ICD-10 Encounter Code Description Active Date MDM Diagnosis E11.621 Type 2 diabetes mellitus with foot ulcer 04/17/2016 No Yes L97.518 Non-pressure chronic ulcer of other part of right foot with other specified 05/17/2020 No Yes severity L97.221 Non-pressure chronic ulcer of left calf limited to breakdown of skin 08/15/2017 No Yes L97.321 Non-pressure chronic ulcer of left ankle limited to breakdown of skin 07/01/2018 No Yes L97.521 Non-pressure chronic ulcer of other part of left foot limited to breakdown of 02/17/2019 No Yes skin L97.524 Non-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 06/14/2020 No Yes Inactive Problems ICD-10 Code Description Active Date Inactive Date L89.613 Pressure ulcer of right heel, stage 3 04/17/2016 04/17/2016 L03.032 Cellulitis of left toe 01/06/2019 01/06/2019 L89.623 Pressure ulcer of left heel, stage 3 12/04/2016 12/04/2016 I25.119 Atherosclerotic heart disease of native coronary artery with unspecified  VAL, SCHIAVO ([id number]) Visit Report for 08/23/2020 Debridement Details Patient Name: Date of Service: [name] [name] [facility] F. 08/23/2020 9:30 A M Medical Record Number: [id number] Patient Account Number: [id number] Date of Birth/Sex: Treating RN: [birth year] (75 y.o. Male) [name] [name] Primary Care Provider: [name], [name] Other Clinician: Referring Provider: Treating Provider/Extender: [name] [name] [name] [name] [name] [name] in Treatment: 227 Debridement Performed for Assessment: Wound #36 Left,Lateral Foot Performed By: Physician [name] [name]., MD Debridement Type: Debridement Severity of Tissue Pre Debridement: Bone involvement without necrosis Level of Consciousness (Pre-procedure): Awake and Alert Pre-procedure Verification/Time Out Yes - 10:55 Taken: Start Time: 10:55 Pain Control: Lidocaine T Area Debrided (L x W): otal 2.5 (cm) x 1.5 (cm) = 3.75 (cm) Tissue and other material debrided: Viable, Non-Viable, Bone, Muscle, Slough, Subcutaneous, Tendon, Skin: Dermis , Skin: Epidermis, Slough Level: Skin/Subcutaneous Tissue/Muscle/Bone Debridement Description: Excisional Instrument: Forceps, Rongeur Specimen: Tissue Culture Number of Specimens T aken: 1 Bleeding: Moderate Hemostasis Achieved: Silver Nitrate End Time: 10:56 Procedural Pain: 0 Post Procedural Pain: 0 Response to Treatment: Procedure was tolerated well Level of Consciousness (Post- Awake and Alert procedure): Post Debridement Measurements of Total Wound Length: (cm) 2.5 Width: (cm) 1.5 Depth: (cm) 1.1 Volume: (cm) 3.24 Character of Wound/Ulcer Post Debridement: Improved Severity of Tissue Post Debridement: Bone involvement without necrosis Post Procedure Diagnosis Same as Pre-procedure Notes bone culture taken Electronic Signature(s) Signed: 08/23/2020 4:28:01 PM By: [name] Ham MD Signed: 08/26/2020 3:14:58 PM By: [name] Hammock RN Entered By: [name] [name] on 08/23/2020  12:54:34 -------------------------------------------------------------------------------- HPI Details Patient Name: Date of Service: [name] [name] F. 08/23/2020 9:30 A M Medical Record Number: [id number] Patient Account Number: [id number] Date of Birth/Sex: Treating RN: March 23, 1946 (75 y.o. Male) [name] [name] Primary Care Provider: Other Clinician: [name], [name] Referring Provider: Treating Provider/Extender: Lynlee Stratton [name] [name] [name] [name] in Treatment: 227 History of Present Illness Location: On the left and right lateral forefoot which has been there for about 6 months Quality: Patient reports No Pain. Severity: Patient states wound(s) are getting worse. Duration: Patient has had the wound for > 6 months prior to seeking treatment at the wound center Context: The wound would happen gradually Modifying Factors: Patient wound(s)/ulcer(s) are worsening due to :continual drainage from the wound ssociated Signs and Symptoms: Patient reports having increase discharge. A HPI Description: This patient returns after being seen here till the end of August and he was lost to follow-up. he has been quite debilitated laying in bed most of the time and his condition has deteriorated significantly. He has multiple ulcerations on the heel lateral forefoot and some of his toes. ======== Old notes: 76 year old male known to our practice when he was seen here in February and March and was lost to follow-up when he was admitted to hospital with various medical problems including coronary artery disease and a stroke. Now returns with the problem on the left forefoot where he has an ulceration and this has been there for about 6 months. most recently he was in hospital between July 6 and July 16, when he was admitted and treated for acute respiratory failure is secondary to aspiration pneumonia, large non-STEMI, ischemic cardiomyopathy with systolic and diastolic congestive heart failure with  ejection fraction about 15-20%, ventricular tachycardia and has been treated with amiodarone, acute on careful up at the common new acute CVA, acute chronic kidney disease stage III, anemia, uncontrolled diabetes mellitus with last hemoglobin A1c being 12%. He has had persistent hyperglycemia given recently. Patient has a past medical history  VAL, SCHIAVO ([id number]) Visit Report for 08/23/2020 Debridement Details Patient Name: Date of Service: [name] [name] [facility] F. 08/23/2020 9:30 A M Medical Record Number: [id number] Patient Account Number: [id number] Date of Birth/Sex: Treating RN: [birth year] (75 y.o. Male) [name] [name] Primary Care Provider: [name], [name] Other Clinician: Referring Provider: Treating Provider/Extender: [name] [name] [name] [name] [name] [name] in Treatment: 227 Debridement Performed for Assessment: Wound #36 Left,Lateral Foot Performed By: Physician [name] [name]., MD Debridement Type: Debridement Severity of Tissue Pre Debridement: Bone involvement without necrosis Level of Consciousness (Pre-procedure): Awake and Alert Pre-procedure Verification/Time Out Yes - 10:55 Taken: Start Time: 10:55 Pain Control: Lidocaine T Area Debrided (L x W): otal 2.5 (cm) x 1.5 (cm) = 3.75 (cm) Tissue and other material debrided: Viable, Non-Viable, Bone, Muscle, Slough, Subcutaneous, Tendon, Skin: Dermis , Skin: Epidermis, Slough Level: Skin/Subcutaneous Tissue/Muscle/Bone Debridement Description: Excisional Instrument: Forceps, Rongeur Specimen: Tissue Culture Number of Specimens T aken: 1 Bleeding: Moderate Hemostasis Achieved: Silver Nitrate End Time: 10:56 Procedural Pain: 0 Post Procedural Pain: 0 Response to Treatment: Procedure was tolerated well Level of Consciousness (Post- Awake and Alert procedure): Post Debridement Measurements of Total Wound Length: (cm) 2.5 Width: (cm) 1.5 Depth: (cm) 1.1 Volume: (cm) 3.24 Character of Wound/Ulcer Post Debridement: Improved Severity of Tissue Post Debridement: Bone involvement without necrosis Post Procedure Diagnosis Same as Pre-procedure Notes bone culture taken Electronic Signature(s) Signed: 08/23/2020 4:28:01 PM By: [name] Ham MD Signed: 08/26/2020 3:14:58 PM By: [name] Hammock RN Entered By: [name] [name] on 08/23/2020  12:54:34 -------------------------------------------------------------------------------- HPI Details Patient Name: Date of Service: [name] [name] F. 08/23/2020 9:30 A M Medical Record Number: [id number] Patient Account Number: [id number] Date of Birth/Sex: Treating RN: March 23, 1946 (75 y.o. Male) [name] [name] Primary Care Provider: Other Clinician: [name], [name] Referring Provider: Treating Provider/Extender: Lynlee Stratton [name] [name] [name] [name] in Treatment: 227 History of Present Illness Location: On the left and right lateral forefoot which has been there for about 6 months Quality: Patient reports No Pain. Severity: Patient states wound(s) are getting worse. Duration: Patient has had the wound for > 6 months prior to seeking treatment at the wound center Context: The wound would happen gradually Modifying Factors: Patient wound(s)/ulcer(s) are worsening due to :continual drainage from the wound ssociated Signs and Symptoms: Patient reports having increase discharge. A HPI Description: This patient returns after being seen here till the end of August and he was lost to follow-up. he has been quite debilitated laying in bed most of the time and his condition has deteriorated significantly. He has multiple ulcerations on the heel lateral forefoot and some of his toes. ======== Old notes: 76 year old male known to our practice when he was seen here in February and March and was lost to follow-up when he was admitted to hospital with various medical problems including coronary artery disease and a stroke. Now returns with the problem on the left forefoot where he has an ulceration and this has been there for about 6 months. most recently he was in hospital between July 6 and July 16, when he was admitted and treated for acute respiratory failure is secondary to aspiration pneumonia, large non-STEMI, ischemic cardiomyopathy with systolic and diastolic congestive heart failure with  ejection fraction about 15-20%, ventricular tachycardia and has been treated with amiodarone, acute on careful up at the common new acute CVA, acute chronic kidney disease stage III, anemia, uncontrolled diabetes mellitus with last hemoglobin A1c being 12%. He has had persistent hyperglycemia given recently. Patient has a past medical history  Every Other Day/15 Days Discharge Instructions: Use triamcinolone 15 (g) as directed Peri-Wound Care: Sween Lotion (Moisturizing lotion) Every Other Day/15 Days Discharge Instructions: Apply moisturizing lotion as directed Prim Dressing: KerraCel Ag Gelling Fiber Dressing, 4x5 in (silver alginate) (Generic) Every Other Day/15 Days ary Discharge Instructions: Apply silver alginate to wound bed as instructed Secondary Dressing: Woven Gauze Sponge, Non-Sterile 4x4 in (Generic) Every Other Day/15 Days Discharge Instructions: Apply over primary dressing as directed. Secondary Dressing: ABD Pad, 5x9 (Generic) Every Other Day/15 Days Discharge Instructions: Apply over primary dressing as directed. Compression Wrap: Kerlix Roll 4.5x3.1 (in/yd) (Generic) Every Other Day/15 Days Discharge Instructions: Apply Kerlix and Coban compression as directed. Compression Wrap: Coban Self-Adherent Wrap 4x5 (in/yd) (Generic) Every Other Day/15 Days Discharge Instructions: Apply over Kerlix as directed. Wound #36 - Foot Wound Laterality: Left, Lateral Cleanser: Wound Cleanser (Generic) Every Other Day/15 Days Discharge Instructions: Cleanse the wound with wound cleanser prior to applying a clean dressing using gauze sponges, not tissue or cotton balls. Cleanser: Soap and Water Every Other Day/15 Days Discharge Instructions: May shower and wash wound with dial antibacterial soap and water prior to dressing change. Peri-Wound Care: Triamcinolone 15  (g) Every Other Day/15 Days Discharge Instructions: Use triamcinolone 15 (g) as directed Peri-Wound Care: Sween Lotion (Moisturizing lotion) Every Other Day/15 Days Discharge Instructions: Apply moisturizing lotion as directed Prim Dressing: KerraCel Ag Gelling Fiber Dressing, 4x5 in (silver alginate) (Generic) Every Other Day/15 Days ary Discharge Instructions: Apply silver alginate to wound bed as instructed Secondary Dressing: Woven Gauze Sponge, Non-Sterile 4x4 in (Generic) Every Other Day/15 Days Discharge Instructions: Apply over primary dressing as directed. Secondary Dressing: ABD Pad, 5x9 (Generic) Every Other Day/15 Days Discharge Instructions: Apply over primary dressing as directed. Compression Wrap: Kerlix Roll 4.5x3.1 (in/yd) (Generic) Every Other Day/15 Days Discharge Instructions: Apply Kerlix and Coban compression as directed. Compression Wrap: Coban Self-Adherent Wrap 4x5 (in/yd) (Generic) Every Other Day/15 Days Discharge Instructions: Apply over Kerlix as directed. Laboratory naerobe culture (MICRO) - PCR bone culture Bacteria identified in Unspecified specimen by A LOINC Code: 950-9 Convenience Name: Anerobic culture Electronic Signature(s) Signed: 08/25/2020 5:06:47 PM By: [name] Ham MD Signed: 08/26/2020 3:14:58 PM By: [name] Hammock RN Previous Signature: 08/23/2020 4:28:01 PM Version By: [name] Ham MD Entered By: [name] [name] on 08/24/2020 07:54:56 -------------------------------------------------------------------------------- Problem List Details Patient Name: Date of Service: [name] [name] F. 08/23/2020 9:30 A M Medical Record Number: [id number] Patient Account Number: [id number] Date of Birth/Sex: Treating RN: Jan 06, 1946 (75 y.o. Male) [name] [name] Primary Care Provider: [name], [name] Other Clinician: Referring Provider: Treating Provider/Extender: Lanee Chain [name] [name] [name] [name] in Treatment: 227 Active  Problems ICD-10 Encounter Code Description Active Date MDM Diagnosis E11.621 Type 2 diabetes mellitus with foot ulcer 04/17/2016 No Yes L97.518 Non-pressure chronic ulcer of other part of right foot with other specified 05/17/2020 No Yes severity L97.221 Non-pressure chronic ulcer of left calf limited to breakdown of skin 08/15/2017 No Yes L97.321 Non-pressure chronic ulcer of left ankle limited to breakdown of skin 07/01/2018 No Yes L97.521 Non-pressure chronic ulcer of other part of left foot limited to breakdown of 02/17/2019 No Yes skin L97.524 Non-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 06/14/2020 No Yes Inactive Problems ICD-10 Code Description Active Date Inactive Date L89.613 Pressure ulcer of right heel, stage 3 04/17/2016 04/17/2016 L03.032 Cellulitis of left toe 01/06/2019 01/06/2019 L89.623 Pressure ulcer of left heel, stage 3 12/04/2016 12/04/2016 I25.119 Atherosclerotic heart disease of native coronary artery with unspecified  VAL, SCHIAVO ([id number]) Visit Report for 08/23/2020 Debridement Details Patient Name: Date of Service: [name] [name] [facility] F. 08/23/2020 9:30 A M Medical Record Number: [id number] Patient Account Number: [id number] Date of Birth/Sex: Treating RN: [birth year] (75 y.o. Male) [name] [name] Primary Care Provider: [name], [name] Other Clinician: Referring Provider: Treating Provider/Extender: [name] [name] [name] [name] [name] [name] in Treatment: 227 Debridement Performed for Assessment: Wound #36 Left,Lateral Foot Performed By: Physician [name] [name]., MD Debridement Type: Debridement Severity of Tissue Pre Debridement: Bone involvement without necrosis Level of Consciousness (Pre-procedure): Awake and Alert Pre-procedure Verification/Time Out Yes - 10:55 Taken: Start Time: 10:55 Pain Control: Lidocaine T Area Debrided (L x W): otal 2.5 (cm) x 1.5 (cm) = 3.75 (cm) Tissue and other material debrided: Viable, Non-Viable, Bone, Muscle, Slough, Subcutaneous, Tendon, Skin: Dermis , Skin: Epidermis, Slough Level: Skin/Subcutaneous Tissue/Muscle/Bone Debridement Description: Excisional Instrument: Forceps, Rongeur Specimen: Tissue Culture Number of Specimens T aken: 1 Bleeding: Moderate Hemostasis Achieved: Silver Nitrate End Time: 10:56 Procedural Pain: 0 Post Procedural Pain: 0 Response to Treatment: Procedure was tolerated well Level of Consciousness (Post- Awake and Alert procedure): Post Debridement Measurements of Total Wound Length: (cm) 2.5 Width: (cm) 1.5 Depth: (cm) 1.1 Volume: (cm) 3.24 Character of Wound/Ulcer Post Debridement: Improved Severity of Tissue Post Debridement: Bone involvement without necrosis Post Procedure Diagnosis Same as Pre-procedure Notes bone culture taken Electronic Signature(s) Signed: 08/23/2020 4:28:01 PM By: [name] Ham MD Signed: 08/26/2020 3:14:58 PM By: [name] Hammock RN Entered By: [name] [name] on 08/23/2020  12:54:34 -------------------------------------------------------------------------------- HPI Details Patient Name: Date of Service: [name] [name] F. 08/23/2020 9:30 A M Medical Record Number: [id number] Patient Account Number: [id number] Date of Birth/Sex: Treating RN: March 23, 1946 (75 y.o. Male) [name] [name] Primary Care Provider: Other Clinician: [name], [name] Referring Provider: Treating Provider/Extender: Lynlee Stratton [name] [name] [name] [name] in Treatment: 227 History of Present Illness Location: On the left and right lateral forefoot which has been there for about 6 months Quality: Patient reports No Pain. Severity: Patient states wound(s) are getting worse. Duration: Patient has had the wound for > 6 months prior to seeking treatment at the wound center Context: The wound would happen gradually Modifying Factors: Patient wound(s)/ulcer(s) are worsening due to :continual drainage from the wound ssociated Signs and Symptoms: Patient reports having increase discharge. A HPI Description: This patient returns after being seen here till the end of August and he was lost to follow-up. he has been quite debilitated laying in bed most of the time and his condition has deteriorated significantly. He has multiple ulcerations on the heel lateral forefoot and some of his toes. ======== Old notes: 76 year old male known to our practice when he was seen here in February and March and was lost to follow-up when he was admitted to hospital with various medical problems including coronary artery disease and a stroke. Now returns with the problem on the left forefoot where he has an ulceration and this has been there for about 6 months. most recently he was in hospital between July 6 and July 16, when he was admitted and treated for acute respiratory failure is secondary to aspiration pneumonia, large non-STEMI, ischemic cardiomyopathy with systolic and diastolic congestive heart failure with  ejection fraction about 15-20%, ventricular tachycardia and has been treated with amiodarone, acute on careful up at the common new acute CVA, acute chronic kidney disease stage III, anemia, uncontrolled diabetes mellitus with last hemoglobin A1c being 12%. He has had persistent hyperglycemia given recently. Patient has a past medical history  challenge such as micro emboli or vasculitis etc. 4/19; Patient presents for 2-week follow-up. He has no complaints today. He finished his course of antibiotics scheduled at last visit for a culture that grew moderate strep group B. He has no new wounds today. His daughter continues to do his dressings 5/3; 2-week follow-up. Since he was last here he had a CT scan of his left foot that suggested osteomyelitis over the fifth MTP joint which is  unfortunately what we expected to see. His daughter had called in late last week to say there was an odor and exposed bone. I did not appreciate an odor and there was no exposed bone but there certainly was a necrotic surface. He also has areas on the left dorsal ankle right dorsal ankle left lateral lower leg He also saw Dr. Loletta Specter on 4/26. 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 He had a CO2 angiogram mostly had a brisk peroneal signal. It was felt that he would only have an option of an amputation from vascular surgery point of view. The patient and his daughter are very opposed to this although he is only minimally ambulatory Electronic Signature(s) Signed: 08/23/2020 4:28:01 PM By: [name] Ham MD Entered By: [name] [name] on 08/23/2020 13:02:39 -------------------------------------------------------------------------------- Physical Exam Details Patient Name: Date of Service: [name] [name] F. 08/23/2020 9:30 [place] Record Number: [id number] Patient Account Number: [id number] Date of Birth/Sex: Treating RN: Jan 25, 1946 (75 y.o. Male) [name] [name] Primary Care Provider: [name], [name] Other Clinician: Referring Provider: Treating Provider/Extender: [name] [name] [name] [name] [name] [name] in Treatment: 227 Notes Wound exam; Right lower extremity he has an area on the dorsal foot. On the left leg he has the original wound on the left lateral leg at the level of the tibia, left dorsal foot and ankle. The new wound from 2 [name] [name] ago over the left fifth met head is necrotic but there was no exposed bone. I remove this necrotic material underlying tendon to get access to bone I used rongeurs to obtain a specimen of bone for PCR culture Electronic Signature(s) Signed: 08/23/2020 4:28:01 PM By: [name] Ham MD Entered By: [name] [name] on 08/23/2020 13:00:18 -------------------------------------------------------------------------------- Physician  Orders Details Patient Name: Date of Service: [name] [name] F. 08/23/2020 9:30 A M Medical Record Number: [id number] Patient Account Number: [id number] Date of Birth/Sex: Treating RN: 02/09/1946 (75 y.o. Male) [name] [name] Primary Care Provider: [name], [name] Other Clinician: Referring Provider: Treating Provider/Extender: [name] [name] [name] [name] [name] [name] in Treatment: 227 Verbal / Phone Orders: No Diagnosis Coding Follow-up Appointments ppointment in 1 week. - Dr. Dellia [name] Return A Bathing/ Shower/ Hygiene May shower with protection but do not get wound dressing(s) wet. Edema Control - Lymphedema / SCD / Other Elevate legs to the level of the heart or above for 30 minutes daily and/or when sitting, a frequency of: Avoid standing for long periods of time. Off-Loading Other: - multipodus boots to both feet Wound Treatment Wound #14 - Lower Leg Wound Laterality: Left, Lateral Cleanser: Wound Cleanser (Generic) Every Other Day/15 Days Discharge Instructions: Cleanse the wound with wound cleanser prior to applying a clean dressing using gauze sponges, not tissue or cotton balls. Cleanser: Soap and Water Every Other Day/15 Days Discharge Instructions: May shower and wash wound with dial antibacterial soap and water prior to dressing change. Peri-Wound Care: Triamcinolone 15 (g) Every Other Day/15 Days Discharge Instructions: Use triamcinolone 15 (g) as directed Peri-Wound Care: Sween Lotion (Moisturizing lotion) Every Other  challenge such as micro emboli or vasculitis etc. 4/19; Patient presents for 2-week follow-up. He has no complaints today. He finished his course of antibiotics scheduled at last visit for a culture that grew moderate strep group B. He has no new wounds today. His daughter continues to do his dressings 5/3; 2-week follow-up. Since he was last here he had a CT scan of his left foot that suggested osteomyelitis over the fifth MTP joint which is  unfortunately what we expected to see. His daughter had called in late last week to say there was an odor and exposed bone. I did not appreciate an odor and there was no exposed bone but there certainly was a necrotic surface. He also has areas on the left dorsal ankle right dorsal ankle left lateral lower leg He also saw Dr. Loletta Specter on 4/26. 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 He had a CO2 angiogram mostly had a brisk peroneal signal. It was felt that he would only have an option of an amputation from vascular surgery point of view. The patient and his daughter are very opposed to this although he is only minimally ambulatory Electronic Signature(s) Signed: 08/23/2020 4:28:01 PM By: [name] Ham MD Entered By: [name] [name] on 08/23/2020 13:02:39 -------------------------------------------------------------------------------- Physical Exam Details Patient Name: Date of Service: [name] [name] F. 08/23/2020 9:30 [place] Record Number: [id number] Patient Account Number: [id number] Date of Birth/Sex: Treating RN: Jan 25, 1946 (75 y.o. Male) [name] [name] Primary Care Provider: [name], [name] Other Clinician: Referring Provider: Treating Provider/Extender: [name] [name] [name] [name] [name] [name] in Treatment: 227 Notes Wound exam; Right lower extremity he has an area on the dorsal foot. On the left leg he has the original wound on the left lateral leg at the level of the tibia, left dorsal foot and ankle. The new wound from 2 [name] [name] ago over the left fifth met head is necrotic but there was no exposed bone. I remove this necrotic material underlying tendon to get access to bone I used rongeurs to obtain a specimen of bone for PCR culture Electronic Signature(s) Signed: 08/23/2020 4:28:01 PM By: [name] Ham MD Entered By: [name] [name] on 08/23/2020 13:00:18 -------------------------------------------------------------------------------- Physician  Orders Details Patient Name: Date of Service: [name] [name] F. 08/23/2020 9:30 A M Medical Record Number: [id number] Patient Account Number: [id number] Date of Birth/Sex: Treating RN: 02/09/1946 (75 y.o. Male) [name] [name] Primary Care Provider: [name], [name] Other Clinician: Referring Provider: Treating Provider/Extender: [name] [name] [name] [name] [name] [name] in Treatment: 227 Verbal / Phone Orders: No Diagnosis Coding Follow-up Appointments ppointment in 1 week. - Dr. Dellia [name] Return A Bathing/ Shower/ Hygiene May shower with protection but do not get wound dressing(s) wet. Edema Control - Lymphedema / SCD / Other Elevate legs to the level of the heart or above for 30 minutes daily and/or when sitting, a frequency of: Avoid standing for long periods of time. Off-Loading Other: - multipodus boots to both feet Wound Treatment Wound #14 - Lower Leg Wound Laterality: Left, Lateral Cleanser: Wound Cleanser (Generic) Every Other Day/15 Days Discharge Instructions: Cleanse the wound with wound cleanser prior to applying a clean dressing using gauze sponges, not tissue or cotton balls. Cleanser: Soap and Water Every Other Day/15 Days Discharge Instructions: May shower and wash wound with dial antibacterial soap and water prior to dressing change. Peri-Wound Care: Triamcinolone 15 (g) Every Other Day/15 Days Discharge Instructions: Use triamcinolone 15 (g) as directed Peri-Wound Care: Sween Lotion (Moisturizing lotion) Every Other  Every Other Day/15 Days Discharge Instructions: Use triamcinolone 15 (g) as directed Peri-Wound Care: Sween Lotion (Moisturizing lotion) Every Other Day/15 Days Discharge Instructions: Apply moisturizing lotion as directed Prim Dressing: KerraCel Ag Gelling Fiber Dressing, 4x5 in (silver alginate) (Generic) Every Other Day/15 Days ary Discharge Instructions: Apply silver alginate to wound bed as instructed Secondary Dressing: Woven Gauze Sponge, Non-Sterile 4x4 in (Generic) Every Other Day/15 Days Discharge Instructions: Apply over primary dressing as directed. Secondary Dressing: ABD Pad, 5x9 (Generic) Every Other Day/15 Days Discharge Instructions: Apply over primary dressing as directed. Compression Wrap: Kerlix Roll 4.5x3.1 (in/yd) (Generic) Every Other Day/15 Days Discharge Instructions: Apply Kerlix and Coban compression as directed. Compression Wrap: Coban Self-Adherent Wrap 4x5 (in/yd) (Generic) Every Other Day/15 Days Discharge Instructions: Apply over Kerlix as directed. Wound #36 - Foot Wound Laterality: Left, Lateral Cleanser: Wound Cleanser (Generic) Every Other Day/15 Days Discharge Instructions: Cleanse the wound with wound cleanser prior to applying a clean dressing using gauze sponges, not tissue or cotton balls. Cleanser: Soap and Water Every Other Day/15 Days Discharge Instructions: May shower and wash wound with dial antibacterial soap and water prior to dressing change. Peri-Wound Care: Triamcinolone 15  (g) Every Other Day/15 Days Discharge Instructions: Use triamcinolone 15 (g) as directed Peri-Wound Care: Sween Lotion (Moisturizing lotion) Every Other Day/15 Days Discharge Instructions: Apply moisturizing lotion as directed Prim Dressing: KerraCel Ag Gelling Fiber Dressing, 4x5 in (silver alginate) (Generic) Every Other Day/15 Days ary Discharge Instructions: Apply silver alginate to wound bed as instructed Secondary Dressing: Woven Gauze Sponge, Non-Sterile 4x4 in (Generic) Every Other Day/15 Days Discharge Instructions: Apply over primary dressing as directed. Secondary Dressing: ABD Pad, 5x9 (Generic) Every Other Day/15 Days Discharge Instructions: Apply over primary dressing as directed. Compression Wrap: Kerlix Roll 4.5x3.1 (in/yd) (Generic) Every Other Day/15 Days Discharge Instructions: Apply Kerlix and Coban compression as directed. Compression Wrap: Coban Self-Adherent Wrap 4x5 (in/yd) (Generic) Every Other Day/15 Days Discharge Instructions: Apply over Kerlix as directed. Laboratory naerobe culture (MICRO) - PCR bone culture Bacteria identified in Unspecified specimen by A LOINC Code: 950-9 Convenience Name: Anerobic culture Electronic Signature(s) Signed: 08/25/2020 5:06:47 PM By: [name] Ham MD Signed: 08/26/2020 3:14:58 PM By: [name] Hammock RN Previous Signature: 08/23/2020 4:28:01 PM Version By: [name] Ham MD Entered By: [name] [name] on 08/24/2020 07:54:56 -------------------------------------------------------------------------------- Problem List Details Patient Name: Date of Service: [name] [name] F. 08/23/2020 9:30 A M Medical Record Number: [id number] Patient Account Number: [id number] Date of Birth/Sex: Treating RN: Jan 06, 1946 (75 y.o. Male) [name] [name] Primary Care Provider: [name], [name] Other Clinician: Referring Provider: Treating Provider/Extender: Lanee Chain [name] [name] [name] [name] in Treatment: 227 Active  Problems ICD-10 Encounter Code Description Active Date MDM Diagnosis E11.621 Type 2 diabetes mellitus with foot ulcer 04/17/2016 No Yes L97.518 Non-pressure chronic ulcer of other part of right foot with other specified 05/17/2020 No Yes severity L97.221 Non-pressure chronic ulcer of left calf limited to breakdown of skin 08/15/2017 No Yes L97.321 Non-pressure chronic ulcer of left ankle limited to breakdown of skin 07/01/2018 No Yes L97.521 Non-pressure chronic ulcer of other part of left foot limited to breakdown of 02/17/2019 No Yes skin L97.524 Non-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 06/14/2020 No Yes Inactive Problems ICD-10 Code Description Active Date Inactive Date L89.613 Pressure ulcer of right heel, stage 3 04/17/2016 04/17/2016 L03.032 Cellulitis of left toe 01/06/2019 01/06/2019 L89.623 Pressure ulcer of left heel, stage 3 12/04/2016 12/04/2016 I25.119 Atherosclerotic heart disease of native coronary artery with unspecified  challenge such as micro emboli or vasculitis etc. 4/19; Patient presents for 2-week follow-up. He has no complaints today. He finished his course of antibiotics scheduled at last visit for a culture that grew moderate strep group B. He has no new wounds today. His daughter continues to do his dressings 5/3; 2-week follow-up. Since he was last here he had a CT scan of his left foot that suggested osteomyelitis over the fifth MTP joint which is  unfortunately what we expected to see. His daughter had called in late last week to say there was an odor and exposed bone. I did not appreciate an odor and there was no exposed bone but there certainly was a necrotic surface. He also has areas on the left dorsal ankle right dorsal ankle left lateral lower leg He also saw Dr. Loletta Specter on 4/26. 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 He had a CO2 angiogram mostly had a brisk peroneal signal. It was felt that he would only have an option of an amputation from vascular surgery point of view. The patient and his daughter are very opposed to this although he is only minimally ambulatory Electronic Signature(s) Signed: 08/23/2020 4:28:01 PM By: [name] Ham MD Entered By: [name] [name] on 08/23/2020 13:02:39 -------------------------------------------------------------------------------- Physical Exam Details Patient Name: Date of Service: [name] [name] F. 08/23/2020 9:30 [place] Record Number: [id number] Patient Account Number: [id number] Date of Birth/Sex: Treating RN: Jan 25, 1946 (75 y.o. Male) [name] [name] Primary Care Provider: [name], [name] Other Clinician: Referring Provider: Treating Provider/Extender: [name] [name] [name] [name] [name] [name] in Treatment: 227 Notes Wound exam; Right lower extremity he has an area on the dorsal foot. On the left leg he has the original wound on the left lateral leg at the level of the tibia, left dorsal foot and ankle. The new wound from 2 [name] [name] ago over the left fifth met head is necrotic but there was no exposed bone. I remove this necrotic material underlying tendon to get access to bone I used rongeurs to obtain a specimen of bone for PCR culture Electronic Signature(s) Signed: 08/23/2020 4:28:01 PM By: [name] Ham MD Entered By: [name] [name] on 08/23/2020 13:00:18 -------------------------------------------------------------------------------- Physician  Orders Details Patient Name: Date of Service: [name] [name] F. 08/23/2020 9:30 A M Medical Record Number: [id number] Patient Account Number: [id number] Date of Birth/Sex: Treating RN: 02/09/1946 (75 y.o. Male) [name] [name] Primary Care Provider: [name], [name] Other Clinician: Referring Provider: Treating Provider/Extender: [name] [name] [name] [name] [name] [name] in Treatment: 227 Verbal / Phone Orders: No Diagnosis Coding Follow-up Appointments ppointment in 1 week. - Dr. Dellia [name] Return A Bathing/ Shower/ Hygiene May shower with protection but do not get wound dressing(s) wet. Edema Control - Lymphedema / SCD / Other Elevate legs to the level of the heart or above for 30 minutes daily and/or when sitting, a frequency of: Avoid standing for long periods of time. Off-Loading Other: - multipodus boots to both feet Wound Treatment Wound #14 - Lower Leg Wound Laterality: Left, Lateral Cleanser: Wound Cleanser (Generic) Every Other Day/15 Days Discharge Instructions: Cleanse the wound with wound cleanser prior to applying a clean dressing using gauze sponges, not tissue or cotton balls. Cleanser: Soap and Water Every Other Day/15 Days Discharge Instructions: May shower and wash wound with dial antibacterial soap and water prior to dressing change. Peri-Wound Care: Triamcinolone 15 (g) Every Other Day/15 Days Discharge Instructions: Use triamcinolone 15 (g) as directed Peri-Wound Care: Sween Lotion (Moisturizing lotion) Every Other  VAL, SCHIAVO ([id number]) Visit Report for 08/23/2020 Debridement Details Patient Name: Date of Service: [name] [name] [facility] F. 08/23/2020 9:30 A M Medical Record Number: [id number] Patient Account Number: [id number] Date of Birth/Sex: Treating RN: [birth year] (75 y.o. Male) [name] [name] Primary Care Provider: [name], [name] Other Clinician: Referring Provider: Treating Provider/Extender: [name] [name] [name] [name] [name] [name] in Treatment: 227 Debridement Performed for Assessment: Wound #36 Left,Lateral Foot Performed By: Physician [name] [name]., MD Debridement Type: Debridement Severity of Tissue Pre Debridement: Bone involvement without necrosis Level of Consciousness (Pre-procedure): Awake and Alert Pre-procedure Verification/Time Out Yes - 10:55 Taken: Start Time: 10:55 Pain Control: Lidocaine T Area Debrided (L x W): otal 2.5 (cm) x 1.5 (cm) = 3.75 (cm) Tissue and other material debrided: Viable, Non-Viable, Bone, Muscle, Slough, Subcutaneous, Tendon, Skin: Dermis , Skin: Epidermis, Slough Level: Skin/Subcutaneous Tissue/Muscle/Bone Debridement Description: Excisional Instrument: Forceps, Rongeur Specimen: Tissue Culture Number of Specimens T aken: 1 Bleeding: Moderate Hemostasis Achieved: Silver Nitrate End Time: 10:56 Procedural Pain: 0 Post Procedural Pain: 0 Response to Treatment: Procedure was tolerated well Level of Consciousness (Post- Awake and Alert procedure): Post Debridement Measurements of Total Wound Length: (cm) 2.5 Width: (cm) 1.5 Depth: (cm) 1.1 Volume: (cm) 3.24 Character of Wound/Ulcer Post Debridement: Improved Severity of Tissue Post Debridement: Bone involvement without necrosis Post Procedure Diagnosis Same as Pre-procedure Notes bone culture taken Electronic Signature(s) Signed: 08/23/2020 4:28:01 PM By: [name] Ham MD Signed: 08/26/2020 3:14:58 PM By: [name] Hammock RN Entered By: [name] [name] on 08/23/2020  12:54:34 -------------------------------------------------------------------------------- HPI Details Patient Name: Date of Service: [name] [name] F. 08/23/2020 9:30 A M Medical Record Number: [id number] Patient Account Number: [id number] Date of Birth/Sex: Treating RN: March 23, 1946 (75 y.o. Male) [name] [name] Primary Care Provider: Other Clinician: [name], [name] Referring Provider: Treating Provider/Extender: Lynlee Stratton [name] [name] [name] [name] in Treatment: 227 History of Present Illness Location: On the left and right lateral forefoot which has been there for about 6 months Quality: Patient reports No Pain. Severity: Patient states wound(s) are getting worse. Duration: Patient has had the wound for > 6 months prior to seeking treatment at the wound center Context: The wound would happen gradually Modifying Factors: Patient wound(s)/ulcer(s) are worsening due to :continual drainage from the wound ssociated Signs and Symptoms: Patient reports having increase discharge. A HPI Description: This patient returns after being seen here till the end of August and he was lost to follow-up. he has been quite debilitated laying in bed most of the time and his condition has deteriorated significantly. He has multiple ulcerations on the heel lateral forefoot and some of his toes. ======== Old notes: 76 year old male known to our practice when he was seen here in February and March and was lost to follow-up when he was admitted to hospital with various medical problems including coronary artery disease and a stroke. Now returns with the problem on the left forefoot where he has an ulceration and this has been there for about 6 months. most recently he was in hospital between July 6 and July 16, when he was admitted and treated for acute respiratory failure is secondary to aspiration pneumonia, large non-STEMI, ischemic cardiomyopathy with systolic and diastolic congestive heart failure with  ejection fraction about 15-20%, ventricular tachycardia and has been treated with amiodarone, acute on careful up at the common new acute CVA, acute chronic kidney disease stage III, anemia, uncontrolled diabetes mellitus with last hemoglobin A1c being 12%. He has had persistent hyperglycemia given recently. Patient has a past medical history  challenge such as micro emboli or vasculitis etc. 4/19; Patient presents for 2-week follow-up. He has no complaints today. He finished his course of antibiotics scheduled at last visit for a culture that grew moderate strep group B. He has no new wounds today. His daughter continues to do his dressings 5/3; 2-week follow-up. Since he was last here he had a CT scan of his left foot that suggested osteomyelitis over the fifth MTP joint which is  unfortunately what we expected to see. His daughter had called in late last week to say there was an odor and exposed bone. I did not appreciate an odor and there was no exposed bone but there certainly was a necrotic surface. He also has areas on the left dorsal ankle right dorsal ankle left lateral lower leg He also saw Dr. Loletta Specter on 4/26. 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 He had a CO2 angiogram mostly had a brisk peroneal signal. It was felt that he would only have an option of an amputation from vascular surgery point of view. The patient and his daughter are very opposed to this although he is only minimally ambulatory Electronic Signature(s) Signed: 08/23/2020 4:28:01 PM By: [name] Ham MD Entered By: [name] [name] on 08/23/2020 13:02:39 -------------------------------------------------------------------------------- Physical Exam Details Patient Name: Date of Service: [name] [name] F. 08/23/2020 9:30 [place] Record Number: [id number] Patient Account Number: [id number] Date of Birth/Sex: Treating RN: Jan 25, 1946 (75 y.o. Male) [name] [name] Primary Care Provider: [name], [name] Other Clinician: Referring Provider: Treating Provider/Extender: [name] [name] [name] [name] [name] [name] in Treatment: 227 Notes Wound exam; Right lower extremity he has an area on the dorsal foot. On the left leg he has the original wound on the left lateral leg at the level of the tibia, left dorsal foot and ankle. The new wound from 2 [name] [name] ago over the left fifth met head is necrotic but there was no exposed bone. I remove this necrotic material underlying tendon to get access to bone I used rongeurs to obtain a specimen of bone for PCR culture Electronic Signature(s) Signed: 08/23/2020 4:28:01 PM By: [name] Ham MD Entered By: [name] [name] on 08/23/2020 13:00:18 -------------------------------------------------------------------------------- Physician  Orders Details Patient Name: Date of Service: [name] [name] F. 08/23/2020 9:30 A M Medical Record Number: [id number] Patient Account Number: [id number] Date of Birth/Sex: Treating RN: 02/09/1946 (75 y.o. Male) [name] [name] Primary Care Provider: [name], [name] Other Clinician: Referring Provider: Treating Provider/Extender: [name] [name] [name] [name] [name] [name] in Treatment: 227 Verbal / Phone Orders: No Diagnosis Coding Follow-up Appointments ppointment in 1 week. - Dr. Dellia [name] Return A Bathing/ Shower/ Hygiene May shower with protection but do not get wound dressing(s) wet. Edema Control - Lymphedema / SCD / Other Elevate legs to the level of the heart or above for 30 minutes daily and/or when sitting, a frequency of: Avoid standing for long periods of time. Off-Loading Other: - multipodus boots to both feet Wound Treatment Wound #14 - Lower Leg Wound Laterality: Left, Lateral Cleanser: Wound Cleanser (Generic) Every Other Day/15 Days Discharge Instructions: Cleanse the wound with wound cleanser prior to applying a clean dressing using gauze sponges, not tissue or cotton balls. Cleanser: Soap and Water Every Other Day/15 Days Discharge Instructions: May shower and wash wound with dial antibacterial soap and water prior to dressing change. Peri-Wound Care: Triamcinolone 15 (g) Every Other Day/15 Days Discharge Instructions: Use triamcinolone 15 (g) as directed Peri-Wound Care: Sween Lotion (Moisturizing lotion) Every Other  VAL, SCHIAVO ([id number]) Visit Report for 08/23/2020 Debridement Details Patient Name: Date of Service: [name] [name] [facility] F. 08/23/2020 9:30 A M Medical Record Number: [id number] Patient Account Number: [id number] Date of Birth/Sex: Treating RN: [birth year] (75 y.o. Male) [name] [name] Primary Care Provider: [name], [name] Other Clinician: Referring Provider: Treating Provider/Extender: [name] [name] [name] [name] [name] [name] in Treatment: 227 Debridement Performed for Assessment: Wound #36 Left,Lateral Foot Performed By: Physician [name] [name]., MD Debridement Type: Debridement Severity of Tissue Pre Debridement: Bone involvement without necrosis Level of Consciousness (Pre-procedure): Awake and Alert Pre-procedure Verification/Time Out Yes - 10:55 Taken: Start Time: 10:55 Pain Control: Lidocaine T Area Debrided (L x W): otal 2.5 (cm) x 1.5 (cm) = 3.75 (cm) Tissue and other material debrided: Viable, Non-Viable, Bone, Muscle, Slough, Subcutaneous, Tendon, Skin: Dermis , Skin: Epidermis, Slough Level: Skin/Subcutaneous Tissue/Muscle/Bone Debridement Description: Excisional Instrument: Forceps, Rongeur Specimen: Tissue Culture Number of Specimens T aken: 1 Bleeding: Moderate Hemostasis Achieved: Silver Nitrate End Time: 10:56 Procedural Pain: 0 Post Procedural Pain: 0 Response to Treatment: Procedure was tolerated well Level of Consciousness (Post- Awake and Alert procedure): Post Debridement Measurements of Total Wound Length: (cm) 2.5 Width: (cm) 1.5 Depth: (cm) 1.1 Volume: (cm) 3.24 Character of Wound/Ulcer Post Debridement: Improved Severity of Tissue Post Debridement: Bone involvement without necrosis Post Procedure Diagnosis Same as Pre-procedure Notes bone culture taken Electronic Signature(s) Signed: 08/23/2020 4:28:01 PM By: [name] Ham MD Signed: 08/26/2020 3:14:58 PM By: [name] Hammock RN Entered By: [name] [name] on 08/23/2020  12:54:34 -------------------------------------------------------------------------------- HPI Details Patient Name: Date of Service: [name] [name] F. 08/23/2020 9:30 A M Medical Record Number: [id number] Patient Account Number: [id number] Date of Birth/Sex: Treating RN: March 23, 1946 (75 y.o. Male) [name] [name] Primary Care Provider: Other Clinician: [name], [name] Referring Provider: Treating Provider/Extender: Lynlee Stratton [name] [name] [name] [name] in Treatment: 227 History of Present Illness Location: On the left and right lateral forefoot which has been there for about 6 months Quality: Patient reports No Pain. Severity: Patient states wound(s) are getting worse. Duration: Patient has had the wound for > 6 months prior to seeking treatment at the wound center Context: The wound would happen gradually Modifying Factors: Patient wound(s)/ulcer(s) are worsening due to :continual drainage from the wound ssociated Signs and Symptoms: Patient reports having increase discharge. A HPI Description: This patient returns after being seen here till the end of August and he was lost to follow-up. he has been quite debilitated laying in bed most of the time and his condition has deteriorated significantly. He has multiple ulcerations on the heel lateral forefoot and some of his toes. ======== Old notes: 76 year old male known to our practice when he was seen here in February and March and was lost to follow-up when he was admitted to hospital with various medical problems including coronary artery disease and a stroke. Now returns with the problem on the left forefoot where he has an ulceration and this has been there for about 6 months. most recently he was in hospital between July 6 and July 16, when he was admitted and treated for acute respiratory failure is secondary to aspiration pneumonia, large non-STEMI, ischemic cardiomyopathy with systolic and diastolic congestive heart failure with  ejection fraction about 15-20%, ventricular tachycardia and has been treated with amiodarone, acute on careful up at the common new acute CVA, acute chronic kidney disease stage III, anemia, uncontrolled diabetes mellitus with last hemoglobin A1c being 12%. He has had persistent hyperglycemia given recently. Patient has a past medical history  challenge such as micro emboli or vasculitis etc. 4/19; Patient presents for 2-week follow-up. He has no complaints today. He finished his course of antibiotics scheduled at last visit for a culture that grew moderate strep group B. He has no new wounds today. His daughter continues to do his dressings 5/3; 2-week follow-up. Since he was last here he had a CT scan of his left foot that suggested osteomyelitis over the fifth MTP joint which is  unfortunately what we expected to see. His daughter had called in late last week to say there was an odor and exposed bone. I did not appreciate an odor and there was no exposed bone but there certainly was a necrotic surface. He also has areas on the left dorsal ankle right dorsal ankle left lateral lower leg He also saw Dr. Loletta Specter on 4/26. 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 He had a CO2 angiogram mostly had a brisk peroneal signal. It was felt that he would only have an option of an amputation from vascular surgery point of view. The patient and his daughter are very opposed to this although he is only minimally ambulatory Electronic Signature(s) Signed: 08/23/2020 4:28:01 PM By: [name] Ham MD Entered By: [name] [name] on 08/23/2020 13:02:39 -------------------------------------------------------------------------------- Physical Exam Details Patient Name: Date of Service: [name] [name] F. 08/23/2020 9:30 [place] Record Number: [id number] Patient Account Number: [id number] Date of Birth/Sex: Treating RN: Jan 25, 1946 (75 y.o. Male) [name] [name] Primary Care Provider: [name], [name] Other Clinician: Referring Provider: Treating Provider/Extender: [name] [name] [name] [name] [name] [name] in Treatment: 227 Notes Wound exam; Right lower extremity he has an area on the dorsal foot. On the left leg he has the original wound on the left lateral leg at the level of the tibia, left dorsal foot and ankle. The new wound from 2 [name] [name] ago over the left fifth met head is necrotic but there was no exposed bone. I remove this necrotic material underlying tendon to get access to bone I used rongeurs to obtain a specimen of bone for PCR culture Electronic Signature(s) Signed: 08/23/2020 4:28:01 PM By: [name] Ham MD Entered By: [name] [name] on 08/23/2020 13:00:18 -------------------------------------------------------------------------------- Physician  Orders Details Patient Name: Date of Service: [name] [name] F. 08/23/2020 9:30 A M Medical Record Number: [id number] Patient Account Number: [id number] Date of Birth/Sex: Treating RN: 02/09/1946 (75 y.o. Male) [name] [name] Primary Care Provider: [name], [name] Other Clinician: Referring Provider: Treating Provider/Extender: [name] [name] [name] [name] [name] [name] in Treatment: 227 Verbal / Phone Orders: No Diagnosis Coding Follow-up Appointments ppointment in 1 week. - Dr. Dellia [name] Return A Bathing/ Shower/ Hygiene May shower with protection but do not get wound dressing(s) wet. Edema Control - Lymphedema / SCD / Other Elevate legs to the level of the heart or above for 30 minutes daily and/or when sitting, a frequency of: Avoid standing for long periods of time. Off-Loading Other: - multipodus boots to both feet Wound Treatment Wound #14 - Lower Leg Wound Laterality: Left, Lateral Cleanser: Wound Cleanser (Generic) Every Other Day/15 Days Discharge Instructions: Cleanse the wound with wound cleanser prior to applying a clean dressing using gauze sponges, not tissue or cotton balls. Cleanser: Soap and Water Every Other Day/15 Days Discharge Instructions: May shower and wash wound with dial antibacterial soap and water prior to dressing change. Peri-Wound Care: Triamcinolone 15 (g) Every Other Day/15 Days Discharge Instructions: Use triamcinolone 15 (g) as directed Peri-Wound Care: Sween Lotion (Moisturizing lotion) Every Other  challenge such as micro emboli or vasculitis etc. 4/19; Patient presents for 2-week follow-up. He has no complaints today. He finished his course of antibiotics scheduled at last visit for a culture that grew moderate strep group B. He has no new wounds today. His daughter continues to do his dressings 5/3; 2-week follow-up. Since he was last here he had a CT scan of his left foot that suggested osteomyelitis over the fifth MTP joint which is  unfortunately what we expected to see. His daughter had called in late last week to say there was an odor and exposed bone. I did not appreciate an odor and there was no exposed bone but there certainly was a necrotic surface. He also has areas on the left dorsal ankle right dorsal ankle left lateral lower leg He also saw Dr. Loletta Specter on 4/26. 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 He had a CO2 angiogram mostly had a brisk peroneal signal. It was felt that he would only have an option of an amputation from vascular surgery point of view. The patient and his daughter are very opposed to this although he is only minimally ambulatory Electronic Signature(s) Signed: 08/23/2020 4:28:01 PM By: [name] Ham MD Entered By: [name] [name] on 08/23/2020 13:02:39 -------------------------------------------------------------------------------- Physical Exam Details Patient Name: Date of Service: [name] [name] F. 08/23/2020 9:30 [place] Record Number: [id number] Patient Account Number: [id number] Date of Birth/Sex: Treating RN: Jan 25, 1946 (75 y.o. Male) [name] [name] Primary Care Provider: [name], [name] Other Clinician: Referring Provider: Treating Provider/Extender: [name] [name] [name] [name] [name] [name] in Treatment: 227 Notes Wound exam; Right lower extremity he has an area on the dorsal foot. On the left leg he has the original wound on the left lateral leg at the level of the tibia, left dorsal foot and ankle. The new wound from 2 [name] [name] ago over the left fifth met head is necrotic but there was no exposed bone. I remove this necrotic material underlying tendon to get access to bone I used rongeurs to obtain a specimen of bone for PCR culture Electronic Signature(s) Signed: 08/23/2020 4:28:01 PM By: [name] Ham MD Entered By: [name] [name] on 08/23/2020 13:00:18 -------------------------------------------------------------------------------- Physician  Orders Details Patient Name: Date of Service: [name] [name] F. 08/23/2020 9:30 A M Medical Record Number: [id number] Patient Account Number: [id number] Date of Birth/Sex: Treating RN: 02/09/1946 (75 y.o. Male) [name] [name] Primary Care Provider: [name], [name] Other Clinician: Referring Provider: Treating Provider/Extender: [name] [name] [name] [name] [name] [name] in Treatment: 227 Verbal / Phone Orders: No Diagnosis Coding Follow-up Appointments ppointment in 1 week. - Dr. Dellia [name] Return A Bathing/ Shower/ Hygiene May shower with protection but do not get wound dressing(s) wet. Edema Control - Lymphedema / SCD / Other Elevate legs to the level of the heart or above for 30 minutes daily and/or when sitting, a frequency of: Avoid standing for long periods of time. Off-Loading Other: - multipodus boots to both feet Wound Treatment Wound #14 - Lower Leg Wound Laterality: Left, Lateral Cleanser: Wound Cleanser (Generic) Every Other Day/15 Days Discharge Instructions: Cleanse the wound with wound cleanser prior to applying a clean dressing using gauze sponges, not tissue or cotton balls. Cleanser: Soap and Water Every Other Day/15 Days Discharge Instructions: May shower and wash wound with dial antibacterial soap and water prior to dressing change. Peri-Wound Care: Triamcinolone 15 (g) Every Other Day/15 Days Discharge Instructions: Use triamcinolone 15 (g) as directed Peri-Wound Care: Sween Lotion (Moisturizing lotion) Every Other  challenge such as micro emboli or vasculitis etc. 4/19; Patient presents for 2-week follow-up. He has no complaints today. He finished his course of antibiotics scheduled at last visit for a culture that grew moderate strep group B. He has no new wounds today. His daughter continues to do his dressings 5/3; 2-week follow-up. Since he was last here he had a CT scan of his left foot that suggested osteomyelitis over the fifth MTP joint which is  unfortunately what we expected to see. His daughter had called in late last week to say there was an odor and exposed bone. I did not appreciate an odor and there was no exposed bone but there certainly was a necrotic surface. He also has areas on the left dorsal ankle right dorsal ankle left lateral lower leg He also saw Dr. Loletta Specter on 4/26. 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 He had a CO2 angiogram mostly had a brisk peroneal signal. It was felt that he would only have an option of an amputation from vascular surgery point of view. The patient and his daughter are very opposed to this although he is only minimally ambulatory Electronic Signature(s) Signed: 08/23/2020 4:28:01 PM By: [name] Ham MD Entered By: [name] [name] on 08/23/2020 13:02:39 -------------------------------------------------------------------------------- Physical Exam Details Patient Name: Date of Service: [name] [name] F. 08/23/2020 9:30 [place] Record Number: [id number] Patient Account Number: [id number] Date of Birth/Sex: Treating RN: Jan 25, 1946 (75 y.o. Male) [name] [name] Primary Care Provider: [name], [name] Other Clinician: Referring Provider: Treating Provider/Extender: [name] [name] [name] [name] [name] [name] in Treatment: 227 Notes Wound exam; Right lower extremity he has an area on the dorsal foot. On the left leg he has the original wound on the left lateral leg at the level of the tibia, left dorsal foot and ankle. The new wound from 2 [name] [name] ago over the left fifth met head is necrotic but there was no exposed bone. I remove this necrotic material underlying tendon to get access to bone I used rongeurs to obtain a specimen of bone for PCR culture Electronic Signature(s) Signed: 08/23/2020 4:28:01 PM By: [name] Ham MD Entered By: [name] [name] on 08/23/2020 13:00:18 -------------------------------------------------------------------------------- Physician  Orders Details Patient Name: Date of Service: [name] [name] F. 08/23/2020 9:30 A M Medical Record Number: [id number] Patient Account Number: [id number] Date of Birth/Sex: Treating RN: 02/09/1946 (75 y.o. Male) [name] [name] Primary Care Provider: [name], [name] Other Clinician: Referring Provider: Treating Provider/Extender: [name] [name] [name] [name] [name] [name] in Treatment: 227 Verbal / Phone Orders: No Diagnosis Coding Follow-up Appointments ppointment in 1 week. - Dr. Dellia [name] Return A Bathing/ Shower/ Hygiene May shower with protection but do not get wound dressing(s) wet. Edema Control - Lymphedema / SCD / Other Elevate legs to the level of the heart or above for 30 minutes daily and/or when sitting, a frequency of: Avoid standing for long periods of time. Off-Loading Other: - multipodus boots to both feet Wound Treatment Wound #14 - Lower Leg Wound Laterality: Left, Lateral Cleanser: Wound Cleanser (Generic) Every Other Day/15 Days Discharge Instructions: Cleanse the wound with wound cleanser prior to applying a clean dressing using gauze sponges, not tissue or cotton balls. Cleanser: Soap and Water Every Other Day/15 Days Discharge Instructions: May shower and wash wound with dial antibacterial soap and water prior to dressing change. Peri-Wound Care: Triamcinolone 15 (g) Every Other Day/15 Days Discharge Instructions: Use triamcinolone 15 (g) as directed Peri-Wound Care: Sween Lotion (Moisturizing lotion) Every Other  VAL, SCHIAVO ([id number]) Visit Report for 08/23/2020 Debridement Details Patient Name: Date of Service: [name] [name] [facility] F. 08/23/2020 9:30 A M Medical Record Number: [id number] Patient Account Number: [id number] Date of Birth/Sex: Treating RN: [birth year] (75 y.o. Male) [name] [name] Primary Care Provider: [name], [name] Other Clinician: Referring Provider: Treating Provider/Extender: [name] [name] [name] [name] [name] [name] in Treatment: 227 Debridement Performed for Assessment: Wound #36 Left,Lateral Foot Performed By: Physician [name] [name]., MD Debridement Type: Debridement Severity of Tissue Pre Debridement: Bone involvement without necrosis Level of Consciousness (Pre-procedure): Awake and Alert Pre-procedure Verification/Time Out Yes - 10:55 Taken: Start Time: 10:55 Pain Control: Lidocaine T Area Debrided (L x W): otal 2.5 (cm) x 1.5 (cm) = 3.75 (cm) Tissue and other material debrided: Viable, Non-Viable, Bone, Muscle, Slough, Subcutaneous, Tendon, Skin: Dermis , Skin: Epidermis, Slough Level: Skin/Subcutaneous Tissue/Muscle/Bone Debridement Description: Excisional Instrument: Forceps, Rongeur Specimen: Tissue Culture Number of Specimens T aken: 1 Bleeding: Moderate Hemostasis Achieved: Silver Nitrate End Time: 10:56 Procedural Pain: 0 Post Procedural Pain: 0 Response to Treatment: Procedure was tolerated well Level of Consciousness (Post- Awake and Alert procedure): Post Debridement Measurements of Total Wound Length: (cm) 2.5 Width: (cm) 1.5 Depth: (cm) 1.1 Volume: (cm) 3.24 Character of Wound/Ulcer Post Debridement: Improved Severity of Tissue Post Debridement: Bone involvement without necrosis Post Procedure Diagnosis Same as Pre-procedure Notes bone culture taken Electronic Signature(s) Signed: 08/23/2020 4:28:01 PM By: [name] Ham MD Signed: 08/26/2020 3:14:58 PM By: [name] Hammock RN Entered By: [name] [name] on 08/23/2020  12:54:34 -------------------------------------------------------------------------------- HPI Details Patient Name: Date of Service: [name] [name] F. 08/23/2020 9:30 A M Medical Record Number: [id number] Patient Account Number: [id number] Date of Birth/Sex: Treating RN: March 23, 1946 (75 y.o. Male) [name] [name] Primary Care Provider: Other Clinician: [name], [name] Referring Provider: Treating Provider/Extender: Lynlee Stratton [name] [name] [name] [name] in Treatment: 227 History of Present Illness Location: On the left and right lateral forefoot which has been there for about 6 months Quality: Patient reports No Pain. Severity: Patient states wound(s) are getting worse. Duration: Patient has had the wound for > 6 months prior to seeking treatment at the wound center Context: The wound would happen gradually Modifying Factors: Patient wound(s)/ulcer(s) are worsening due to :continual drainage from the wound ssociated Signs and Symptoms: Patient reports having increase discharge. A HPI Description: This patient returns after being seen here till the end of August and he was lost to follow-up. he has been quite debilitated laying in bed most of the time and his condition has deteriorated significantly. He has multiple ulcerations on the heel lateral forefoot and some of his toes. ======== Old notes: 76 year old male known to our practice when he was seen here in February and March and was lost to follow-up when he was admitted to hospital with various medical problems including coronary artery disease and a stroke. Now returns with the problem on the left forefoot where he has an ulceration and this has been there for about 6 months. most recently he was in hospital between July 6 and July 16, when he was admitted and treated for acute respiratory failure is secondary to aspiration pneumonia, large non-STEMI, ischemic cardiomyopathy with systolic and diastolic congestive heart failure with  ejection fraction about 15-20%, ventricular tachycardia and has been treated with amiodarone, acute on careful up at the common new acute CVA, acute chronic kidney disease stage III, anemia, uncontrolled diabetes mellitus with last hemoglobin A1c being 12%. He has had persistent hyperglycemia given recently. Patient has a past medical history  Every Other Day/15 Days Discharge Instructions: Use triamcinolone 15 (g) as directed Peri-Wound Care: Sween Lotion (Moisturizing lotion) Every Other Day/15 Days Discharge Instructions: Apply moisturizing lotion as directed Prim Dressing: KerraCel Ag Gelling Fiber Dressing, 4x5 in (silver alginate) (Generic) Every Other Day/15 Days ary Discharge Instructions: Apply silver alginate to wound bed as instructed Secondary Dressing: Woven Gauze Sponge, Non-Sterile 4x4 in (Generic) Every Other Day/15 Days Discharge Instructions: Apply over primary dressing as directed. Secondary Dressing: ABD Pad, 5x9 (Generic) Every Other Day/15 Days Discharge Instructions: Apply over primary dressing as directed. Compression Wrap: Kerlix Roll 4.5x3.1 (in/yd) (Generic) Every Other Day/15 Days Discharge Instructions: Apply Kerlix and Coban compression as directed. Compression Wrap: Coban Self-Adherent Wrap 4x5 (in/yd) (Generic) Every Other Day/15 Days Discharge Instructions: Apply over Kerlix as directed. Wound #36 - Foot Wound Laterality: Left, Lateral Cleanser: Wound Cleanser (Generic) Every Other Day/15 Days Discharge Instructions: Cleanse the wound with wound cleanser prior to applying a clean dressing using gauze sponges, not tissue or cotton balls. Cleanser: Soap and Water Every Other Day/15 Days Discharge Instructions: May shower and wash wound with dial antibacterial soap and water prior to dressing change. Peri-Wound Care: Triamcinolone 15  (g) Every Other Day/15 Days Discharge Instructions: Use triamcinolone 15 (g) as directed Peri-Wound Care: Sween Lotion (Moisturizing lotion) Every Other Day/15 Days Discharge Instructions: Apply moisturizing lotion as directed Prim Dressing: KerraCel Ag Gelling Fiber Dressing, 4x5 in (silver alginate) (Generic) Every Other Day/15 Days ary Discharge Instructions: Apply silver alginate to wound bed as instructed Secondary Dressing: Woven Gauze Sponge, Non-Sterile 4x4 in (Generic) Every Other Day/15 Days Discharge Instructions: Apply over primary dressing as directed. Secondary Dressing: ABD Pad, 5x9 (Generic) Every Other Day/15 Days Discharge Instructions: Apply over primary dressing as directed. Compression Wrap: Kerlix Roll 4.5x3.1 (in/yd) (Generic) Every Other Day/15 Days Discharge Instructions: Apply Kerlix and Coban compression as directed. Compression Wrap: Coban Self-Adherent Wrap 4x5 (in/yd) (Generic) Every Other Day/15 Days Discharge Instructions: Apply over Kerlix as directed. Laboratory naerobe culture (MICRO) - PCR bone culture Bacteria identified in Unspecified specimen by A LOINC Code: 950-9 Convenience Name: Anerobic culture Electronic Signature(s) Signed: 08/25/2020 5:06:47 PM By: [name] Ham MD Signed: 08/26/2020 3:14:58 PM By: [name] Hammock RN Previous Signature: 08/23/2020 4:28:01 PM Version By: [name] Ham MD Entered By: [name] [name] on 08/24/2020 07:54:56 -------------------------------------------------------------------------------- Problem List Details Patient Name: Date of Service: [name] [name] F. 08/23/2020 9:30 A M Medical Record Number: [id number] Patient Account Number: [id number] Date of Birth/Sex: Treating RN: Jan 06, 1946 (75 y.o. Male) [name] [name] Primary Care Provider: [name], [name] Other Clinician: Referring Provider: Treating Provider/Extender: Lanee Chain [name] [name] [name] [name] in Treatment: 227 Active  Problems ICD-10 Encounter Code Description Active Date MDM Diagnosis E11.621 Type 2 diabetes mellitus with foot ulcer 04/17/2016 No Yes L97.518 Non-pressure chronic ulcer of other part of right foot with other specified 05/17/2020 No Yes severity L97.221 Non-pressure chronic ulcer of left calf limited to breakdown of skin 08/15/2017 No Yes L97.321 Non-pressure chronic ulcer of left ankle limited to breakdown of skin 07/01/2018 No Yes L97.521 Non-pressure chronic ulcer of other part of left foot limited to breakdown of 02/17/2019 No Yes skin L97.524 Non-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 06/14/2020 No Yes Inactive Problems ICD-10 Code Description Active Date Inactive Date L89.613 Pressure ulcer of right heel, stage 3 04/17/2016 04/17/2016 L03.032 Cellulitis of left toe 01/06/2019 01/06/2019 L89.623 Pressure ulcer of left heel, stage 3 12/04/2016 12/04/2016 I25.119 Atherosclerotic heart disease of native coronary artery with unspecified  VAL, SCHIAVO ([id number]) Visit Report for 08/23/2020 Debridement Details Patient Name: Date of Service: [name] [name] [facility] F. 08/23/2020 9:30 A M Medical Record Number: [id number] Patient Account Number: [id number] Date of Birth/Sex: Treating RN: [birth year] (75 y.o. Male) [name] [name] Primary Care Provider: [name], [name] Other Clinician: Referring Provider: Treating Provider/Extender: [name] [name] [name] [name] [name] [name] in Treatment: 227 Debridement Performed for Assessment: Wound #36 Left,Lateral Foot Performed By: Physician [name] [name]., MD Debridement Type: Debridement Severity of Tissue Pre Debridement: Bone involvement without necrosis Level of Consciousness (Pre-procedure): Awake and Alert Pre-procedure Verification/Time Out Yes - 10:55 Taken: Start Time: 10:55 Pain Control: Lidocaine T Area Debrided (L x W): otal 2.5 (cm) x 1.5 (cm) = 3.75 (cm) Tissue and other material debrided: Viable, Non-Viable, Bone, Muscle, Slough, Subcutaneous, Tendon, Skin: Dermis , Skin: Epidermis, Slough Level: Skin/Subcutaneous Tissue/Muscle/Bone Debridement Description: Excisional Instrument: Forceps, Rongeur Specimen: Tissue Culture Number of Specimens T aken: 1 Bleeding: Moderate Hemostasis Achieved: Silver Nitrate End Time: 10:56 Procedural Pain: 0 Post Procedural Pain: 0 Response to Treatment: Procedure was tolerated well Level of Consciousness (Post- Awake and Alert procedure): Post Debridement Measurements of Total Wound Length: (cm) 2.5 Width: (cm) 1.5 Depth: (cm) 1.1 Volume: (cm) 3.24 Character of Wound/Ulcer Post Debridement: Improved Severity of Tissue Post Debridement: Bone involvement without necrosis Post Procedure Diagnosis Same as Pre-procedure Notes bone culture taken Electronic Signature(s) Signed: 08/23/2020 4:28:01 PM By: [name] Ham MD Signed: 08/26/2020 3:14:58 PM By: [name] Hammock RN Entered By: [name] [name] on 08/23/2020  12:54:34 -------------------------------------------------------------------------------- HPI Details Patient Name: Date of Service: [name] [name] F. 08/23/2020 9:30 A M Medical Record Number: [id number] Patient Account Number: [id number] Date of Birth/Sex: Treating RN: March 23, 1946 (75 y.o. Male) [name] [name] Primary Care Provider: Other Clinician: [name], [name] Referring Provider: Treating Provider/Extender: Lynlee Stratton [name] [name] [name] [name] in Treatment: 227 History of Present Illness Location: On the left and right lateral forefoot which has been there for about 6 months Quality: Patient reports No Pain. Severity: Patient states wound(s) are getting worse. Duration: Patient has had the wound for > 6 months prior to seeking treatment at the wound center Context: The wound would happen gradually Modifying Factors: Patient wound(s)/ulcer(s) are worsening due to :continual drainage from the wound ssociated Signs and Symptoms: Patient reports having increase discharge. A HPI Description: This patient returns after being seen here till the end of August and he was lost to follow-up. he has been quite debilitated laying in bed most of the time and his condition has deteriorated significantly. He has multiple ulcerations on the heel lateral forefoot and some of his toes. ======== Old notes: 76 year old male known to our practice when he was seen here in February and March and was lost to follow-up when he was admitted to hospital with various medical problems including coronary artery disease and a stroke. Now returns with the problem on the left forefoot where he has an ulceration and this has been there for about 6 months. most recently he was in hospital between July 6 and July 16, when he was admitted and treated for acute respiratory failure is secondary to aspiration pneumonia, large non-STEMI, ischemic cardiomyopathy with systolic and diastolic congestive heart failure with  ejection fraction about 15-20%, ventricular tachycardia and has been treated with amiodarone, acute on careful up at the common new acute CVA, acute chronic kidney disease stage III, anemia, uncontrolled diabetes mellitus with last hemoglobin A1c being 12%. He has had persistent hyperglycemia given recently. Patient has a past medical history  Every Other Day/15 Days Discharge Instructions: Use triamcinolone 15 (g) as directed Peri-Wound Care: Sween Lotion (Moisturizing lotion) Every Other Day/15 Days Discharge Instructions: Apply moisturizing lotion as directed Prim Dressing: KerraCel Ag Gelling Fiber Dressing, 4x5 in (silver alginate) (Generic) Every Other Day/15 Days ary Discharge Instructions: Apply silver alginate to wound bed as instructed Secondary Dressing: Woven Gauze Sponge, Non-Sterile 4x4 in (Generic) Every Other Day/15 Days Discharge Instructions: Apply over primary dressing as directed. Secondary Dressing: ABD Pad, 5x9 (Generic) Every Other Day/15 Days Discharge Instructions: Apply over primary dressing as directed. Compression Wrap: Kerlix Roll 4.5x3.1 (in/yd) (Generic) Every Other Day/15 Days Discharge Instructions: Apply Kerlix and Coban compression as directed. Compression Wrap: Coban Self-Adherent Wrap 4x5 (in/yd) (Generic) Every Other Day/15 Days Discharge Instructions: Apply over Kerlix as directed. Wound #36 - Foot Wound Laterality: Left, Lateral Cleanser: Wound Cleanser (Generic) Every Other Day/15 Days Discharge Instructions: Cleanse the wound with wound cleanser prior to applying a clean dressing using gauze sponges, not tissue or cotton balls. Cleanser: Soap and Water Every Other Day/15 Days Discharge Instructions: May shower and wash wound with dial antibacterial soap and water prior to dressing change. Peri-Wound Care: Triamcinolone 15  (g) Every Other Day/15 Days Discharge Instructions: Use triamcinolone 15 (g) as directed Peri-Wound Care: Sween Lotion (Moisturizing lotion) Every Other Day/15 Days Discharge Instructions: Apply moisturizing lotion as directed Prim Dressing: KerraCel Ag Gelling Fiber Dressing, 4x5 in (silver alginate) (Generic) Every Other Day/15 Days ary Discharge Instructions: Apply silver alginate to wound bed as instructed Secondary Dressing: Woven Gauze Sponge, Non-Sterile 4x4 in (Generic) Every Other Day/15 Days Discharge Instructions: Apply over primary dressing as directed. Secondary Dressing: ABD Pad, 5x9 (Generic) Every Other Day/15 Days Discharge Instructions: Apply over primary dressing as directed. Compression Wrap: Kerlix Roll 4.5x3.1 (in/yd) (Generic) Every Other Day/15 Days Discharge Instructions: Apply Kerlix and Coban compression as directed. Compression Wrap: Coban Self-Adherent Wrap 4x5 (in/yd) (Generic) Every Other Day/15 Days Discharge Instructions: Apply over Kerlix as directed. Laboratory naerobe culture (MICRO) - PCR bone culture Bacteria identified in Unspecified specimen by A LOINC Code: 950-9 Convenience Name: Anerobic culture Electronic Signature(s) Signed: 08/25/2020 5:06:47 PM By: [name] Ham MD Signed: 08/26/2020 3:14:58 PM By: [name] Hammock RN Previous Signature: 08/23/2020 4:28:01 PM Version By: [name] Ham MD Entered By: [name] [name] on 08/24/2020 07:54:56 -------------------------------------------------------------------------------- Problem List Details Patient Name: Date of Service: [name] [name] F. 08/23/2020 9:30 A M Medical Record Number: [id number] Patient Account Number: [id number] Date of Birth/Sex: Treating RN: Jan 06, 1946 (75 y.o. Male) [name] [name] Primary Care Provider: [name], [name] Other Clinician: Referring Provider: Treating Provider/Extender: Lanee Chain [name] [name] [name] [name] in Treatment: 227 Active  Problems ICD-10 Encounter Code Description Active Date MDM Diagnosis E11.621 Type 2 diabetes mellitus with foot ulcer 04/17/2016 No Yes L97.518 Non-pressure chronic ulcer of other part of right foot with other specified 05/17/2020 No Yes severity L97.221 Non-pressure chronic ulcer of left calf limited to breakdown of skin 08/15/2017 No Yes L97.321 Non-pressure chronic ulcer of left ankle limited to breakdown of skin 07/01/2018 No Yes L97.521 Non-pressure chronic ulcer of other part of left foot limited to breakdown of 02/17/2019 No Yes skin L97.524 Non-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 06/14/2020 No Yes Inactive Problems ICD-10 Code Description Active Date Inactive Date L89.613 Pressure ulcer of right heel, stage 3 04/17/2016 04/17/2016 L03.032 Cellulitis of left toe 01/06/2019 01/06/2019 L89.623 Pressure ulcer of left heel, stage 3 12/04/2016 12/04/2016 I25.119 Atherosclerotic heart disease of native coronary artery with unspecified  Every Other Day/15 Days Discharge Instructions: Use triamcinolone 15 (g) as directed Peri-Wound Care: Sween Lotion (Moisturizing lotion) Every Other Day/15 Days Discharge Instructions: Apply moisturizing lotion as directed Prim Dressing: KerraCel Ag Gelling Fiber Dressing, 4x5 in (silver alginate) (Generic) Every Other Day/15 Days ary Discharge Instructions: Apply silver alginate to wound bed as instructed Secondary Dressing: Woven Gauze Sponge, Non-Sterile 4x4 in (Generic) Every Other Day/15 Days Discharge Instructions: Apply over primary dressing as directed. Secondary Dressing: ABD Pad, 5x9 (Generic) Every Other Day/15 Days Discharge Instructions: Apply over primary dressing as directed. Compression Wrap: Kerlix Roll 4.5x3.1 (in/yd) (Generic) Every Other Day/15 Days Discharge Instructions: Apply Kerlix and Coban compression as directed. Compression Wrap: Coban Self-Adherent Wrap 4x5 (in/yd) (Generic) Every Other Day/15 Days Discharge Instructions: Apply over Kerlix as directed. Wound #36 - Foot Wound Laterality: Left, Lateral Cleanser: Wound Cleanser (Generic) Every Other Day/15 Days Discharge Instructions: Cleanse the wound with wound cleanser prior to applying a clean dressing using gauze sponges, not tissue or cotton balls. Cleanser: Soap and Water Every Other Day/15 Days Discharge Instructions: May shower and wash wound with dial antibacterial soap and water prior to dressing change. Peri-Wound Care: Triamcinolone 15  (g) Every Other Day/15 Days Discharge Instructions: Use triamcinolone 15 (g) as directed Peri-Wound Care: Sween Lotion (Moisturizing lotion) Every Other Day/15 Days Discharge Instructions: Apply moisturizing lotion as directed Prim Dressing: KerraCel Ag Gelling Fiber Dressing, 4x5 in (silver alginate) (Generic) Every Other Day/15 Days ary Discharge Instructions: Apply silver alginate to wound bed as instructed Secondary Dressing: Woven Gauze Sponge, Non-Sterile 4x4 in (Generic) Every Other Day/15 Days Discharge Instructions: Apply over primary dressing as directed. Secondary Dressing: ABD Pad, 5x9 (Generic) Every Other Day/15 Days Discharge Instructions: Apply over primary dressing as directed. Compression Wrap: Kerlix Roll 4.5x3.1 (in/yd) (Generic) Every Other Day/15 Days Discharge Instructions: Apply Kerlix and Coban compression as directed. Compression Wrap: Coban Self-Adherent Wrap 4x5 (in/yd) (Generic) Every Other Day/15 Days Discharge Instructions: Apply over Kerlix as directed. Laboratory naerobe culture (MICRO) - PCR bone culture Bacteria identified in Unspecified specimen by A LOINC Code: 950-9 Convenience Name: Anerobic culture Electronic Signature(s) Signed: 08/25/2020 5:06:47 PM By: [name] Ham MD Signed: 08/26/2020 3:14:58 PM By: [name] Hammock RN Previous Signature: 08/23/2020 4:28:01 PM Version By: [name] Ham MD Entered By: [name] [name] on 08/24/2020 07:54:56 -------------------------------------------------------------------------------- Problem List Details Patient Name: Date of Service: [name] [name] F. 08/23/2020 9:30 A M Medical Record Number: [id number] Patient Account Number: [id number] Date of Birth/Sex: Treating RN: Jan 06, 1946 (75 y.o. Male) [name] [name] Primary Care Provider: [name], [name] Other Clinician: Referring Provider: Treating Provider/Extender: Lanee Chain [name] [name] [name] [name] in Treatment: 227 Active  Problems ICD-10 Encounter Code Description Active Date MDM Diagnosis E11.621 Type 2 diabetes mellitus with foot ulcer 04/17/2016 No Yes L97.518 Non-pressure chronic ulcer of other part of right foot with other specified 05/17/2020 No Yes severity L97.221 Non-pressure chronic ulcer of left calf limited to breakdown of skin 08/15/2017 No Yes L97.321 Non-pressure chronic ulcer of left ankle limited to breakdown of skin 07/01/2018 No Yes L97.521 Non-pressure chronic ulcer of other part of left foot limited to breakdown of 02/17/2019 No Yes skin L97.524 Non-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 06/14/2020 No Yes Inactive Problems ICD-10 Code Description Active Date Inactive Date L89.613 Pressure ulcer of right heel, stage 3 04/17/2016 04/17/2016 L03.032 Cellulitis of left toe 01/06/2019 01/06/2019 L89.623 Pressure ulcer of left heel, stage 3 12/04/2016 12/04/2016 I25.119 Atherosclerotic heart disease of native coronary artery with unspecified

## 2020-08-26 NOTE — Progress Notes (Signed)
Hoffman, Jessica [name], [name] Weeks in Treatment: 225 Wound Status Wound Number: 35 Primary Diabetic Wound/Ulcer of the Lower Extremity Etiology: Wound Location: Left, Lateral, Dorsal Foot Wound Open Wounding Event: Gradually Appeared Status: Date Acquired: 05/24/2020 Comorbid Anemia, Sleep Apnea, Arrhythmia, Congestive Heart Failure, Weeks Of Treatment: 8 History: Coronary Artery Disease, Hypertension, Myocardial Infarction, Clustered Wound: No Type II Diabetes, Gout, Neuropathy Photos Wound Measurements Length: (cm) 1.3 Width: (cm) 1.8 Depth: (cm) 0.5 Area: (cm) 1.838 Volume: (cm) 0.919 % Reduction in Area: -290.2% % Reduction in Volume: -289.4% Epithelialization: None Tunneling: No Undermining: No Wound Description Classification: Grade 2 Wound Margin: Distinct, outline attached Exudate Amount: Medium Exudate Type: Purulent Exudate Color: yellow, brown, green Foul Odor After Cleansing: No Slough/Fibrino Yes Wound Bed Granulation Amount: Medium (34-66%) Exposed Structure Granulation Quality: Red, Hyper-granulation Fascia Exposed: No Necrotic Amount: Medium (34-66%) Fat Layer (Subcutaneous Tissue) Exposed: Yes Necrotic Quality: Adherent Slough Tendon Exposed: No Muscle Exposed: No Joint Exposed: No Bone Exposed: Yes Electronic Signature(s) Signed: 08/10/2020 10:33:47 AM By: Sandre Kitty Signed: 08/10/2020 6:20:01 PM By: Baruch Gouty RN, BSN Entered By: Sandre Kitty on 08/09/2020 16:35:02 -------------------------------------------------------------------------------- Wound Assessment Details Patient Name: Date of Service: [name] Foley F. 08/09/2020 9:30 A M Medical Record Number:  [id number] Patient Account Number: [id number] Date of Birth/Sex: Treating RN: October 08, 1945 (75 y.o. Male) Baruch Gouty Primary Care Chenae Brager: [name], [name] Other Clinician: Referring Naithan Delage: Treating Chalice Philbert/Extender: Hoffman, Jessica [name], [name] Weeks in Treatment: 225 Wound Status Wound Number: 36 Primary Venous Leg Ulcer Etiology: Wound Location: Left, Lateral Foot Wound Open Wounding Event: Gradually Appeared Status: Date Acquired: 07/19/2020 Comorbid Anemia, Sleep Apnea, Arrhythmia, Congestive Heart Failure, Weeks Of Treatment: 2 History: Coronary Artery Disease, Hypertension, Myocardial Infarction, Clustered Wound: No Type II Diabetes, Gout, Neuropathy Photos Wound Measurements Length: (cm) 2.4 Width: (cm) 2 Depth: (cm) 0.6 Area: (cm) 3.77 Volume: (cm) 2.262 % Reduction in Area: 12.1% % Reduction in Volume: -163.6% Epithelialization: None Tunneling: No Undermining: No Wound Description Classification: Full Thickness Without Exposed Support Structures Wound Margin: Flat and Intact Exudate Amount: Medium Exudate Type: Serosanguineous Exudate Color: red, brown Foul Odor After Cleansing: No Slough/Fibrino Yes Wound Bed Granulation Amount: Small (1-33%) Exposed Structure Granulation Quality: Pink Fascia Exposed: No Necrotic Amount: Large (67-100%) Fat Layer (Subcutaneous Tissue) Exposed: Yes Necrotic Quality: Adherent Slough Tendon Exposed: No Muscle Exposed: No Joint Exposed: No Bone Exposed: No Electronic Signature(s) Signed: 08/10/2020 6:20:01 PM By: Baruch Gouty RN, BSN Signed: 08/26/2020 3:14:58 PM By: [name] Entered By: [name] on 08/09/2020 17:02:53 -------------------------------------------------------------------------------- Vitals Details Patient Name: Date of Service: [name] Foley F. 08/09/2020 9:30 A M Medical Record Number: [id number] Patient Account Number: [id number] Date of Birth/Sex: Treating  RN: [birth year] (75 y.o. Male) [name] Primary Care Signe Tackitt: [name], [name] Other Clinician: Referring Asad Keeven: Treating Blyss Lugar/Extender: Hoffman, Jessica [name], [name] Weeks in Treatment: 225 Vital Signs Time Taken: 09:52 Temperature (F): 98.2 Height (in): 74 Pulse (bpm): 74 Weight (lbs): 150 Respiratory Rate (breaths/min): 18 Body Mass Index (BMI): 19.3 Blood Pressure (mmHg): 98/59 Capillary Blood Glucose (mg/dl): 124 Reference Range: 80 - 120 mg / dl Electronic Signature(s) Signed: 08/09/2020 10:57:26 AM By: Sandre Kitty Entered By: Sandre Kitty on 08/09/2020 09:53:09  Treating Haruna Rohlfs/Extender: Hoffman, Jessica [name], [name] Weeks in Treatment: 225 Wound Status Wound Number: 25 Primary Venous Leg Ulcer Etiology: Wound Location: Right, Lateral Lower Leg Wound Open Wounding Event: Gradually Appeared Status: Date Acquired: 07/06/2019 Comorbid Anemia, Sleep Apnea, Arrhythmia, Congestive Heart Failure, Weeks Of Treatment: 55 History: Coronary Artery Disease, Hypertension, Myocardial Infarction, Clustered Wound: No Type II Diabetes, Gout, Neuropathy Photos Wound Measurements Length: (cm) 1 Width: (cm) 0.4 Depth: (cm) 0.1 Area: (cm) 0.314 Volume: (cm) 0.031 % Reduction in Area: 88.5% % Reduction in Volume: 88.6% Epithelialization: Medium (34-66%) Tunneling: No Undermining: No Wound Description Classification: Full Thickness Without Exposed Support Structures Wound Margin: Flat and Intact Exudate Amount: Medium Exudate Type: Serosanguineous Exudate Color: red, brown Foul Odor After Cleansing: No Slough/Fibrino No Wound Bed Granulation Amount: Large (67-100%) Exposed Structure Granulation Quality: Red Fascia Exposed: No Necrotic Amount: None Present (0%) Fat Layer (Subcutaneous Tissue) Exposed: Yes Tendon Exposed: No Muscle Exposed: No Joint Exposed: No Bone Exposed: No Electronic Signature(s) Signed: 08/10/2020 10:33:47 AM By: Sandre Kitty Signed: 08/10/2020 6:20:01 PM By: Baruch Gouty RN, BSN Entered By: Sandre Kitty on 08/09/2020 16:24:48 -------------------------------------------------------------------------------- Wound Assessment Details Patient Name: Date of Service: [name] Foley F. 08/09/2020 9:30 A M Medical Record Number: [id number] Patient Account Number: [id number] Date of Birth/Sex: Treating RN: 12-Aug-1945 (75 y.o. Male) Baruch Gouty Primary Care Jahmad Petrich: [name], [name] Other Clinician: Referring Chelci Winterm[place]: Treating Glenette Bookwalter/Extender: Hoffman, Jessica [name], [name] Weeks in Treatment: 225 Wound Status Wound Number: 26 Primary Diabetic Wound/Ulcer of the Lower Extremity Etiology: Wound Location: Left, Dorsal Foot Wound Open Wounding Event: Gradually Appeared Wounding Event: Gradually Appeared Status: Date Acquired: 08/18/2019 Comorbid Anemia, Sleep Apnea, Arrhythmia, Congestive Heart Failure, Weeks Of Treatment: 51 History: Coronary Artery Disease, Hypertension, Myocardial Infarction, Clustered Wound: Yes Type II Diabetes, Gout, Neuropathy Photos Wound Measurements Length: (cm) 3.6 Width: (cm) 1 Depth: (cm) 0.1 Clustered Quantity: 4 Area: (cm) 2.827 Volume: (cm) 0.283 % Reduction in Area: -414% % Reduction in Volume: -414.5% Epithelialization: Small (1-33%) Tunneling: No Undermining: No Wound Description Classification: Grade 2 Wound Margin: Distinct, outline attached Exudate Amount: Medium Exudate Type: Purulent Exudate Color: yellow, brown, green Foul Odor After Cleansing: No Slough/Fibrino Yes Wound Bed Granulation Amount: Small (1-33%) Exposed Structure Granulation Quality: Red, Pink Fascia Exposed: No Necrotic Amount: Large (67-100%) Fat Layer (Subcutaneous Tissue) Exposed: Yes Necrotic Quality: Eschar, Adherent Slough Tendon Exposed: Yes Muscle Exposed: No Joint Exposed: No Bone Exposed: No Electronic Signature(s) Signed: 08/10/2020 10:33:47 AM By: Sandre Kitty Signed: 08/10/2020 6:20:01 PM By: Baruch Gouty RN, BSN Entered By:  Sandre Kitty on 08/09/2020 16:32:57 -------------------------------------------------------------------------------- Wound Assessment Details Patient Name: Date of Service: [name] Foley F. 08/09/2020 9:30 A M Medical Record Number: [id number] Patient Account Number: [id number] Date of Birth/Sex: Treating RN: 05/05/1945 (75 y.o. Male) Baruch Gouty Primary Care Kailia Starry: [name], [name] Other Clinician: Referring Tambra Muller: Treating Adin Lariccia/Extender: Hoffman, Jessica [name], [name] Weeks in Treatment: 225 Wound Status Wound Number: 28 Primary Diabetic Wound/Ulcer of the Lower Extremity Etiology: Wound Location: Right, Dorsal Foot Wound Open Wounding Event: Gradually Appeared Status: Date Acquired: 04/26/2020 Comorbid Anemia, Sleep Apnea, Arrhythmia, Congestive Heart Failure, Weeks Of Treatment: 15 History: Coronary Artery Disease, Hypertension, Myocardial Infarction, Clustered Wound: Yes Type II Diabetes, Gout, Neuropathy Wound Measurements Length: (cm) 0.1 Width: (cm) 0.1 Depth: (cm) 0.1 Clustered Quantity: 2 Area: (cm) 0.008 Volume: (cm) 0.001 % Reduction in Area: 99.2% % Reduction in Volume: 99% Epithelialization: Large (67-100%) Tunneling: No Undermining: No Wound Description Classification: Grade 1 Wound Margin: Indistinct, nonvisible Exudate Amount:  Treating Haruna Rohlfs/Extender: Hoffman, Jessica [name], [name] Weeks in Treatment: 225 Wound Status Wound Number: 25 Primary Venous Leg Ulcer Etiology: Wound Location: Right, Lateral Lower Leg Wound Open Wounding Event: Gradually Appeared Status: Date Acquired: 07/06/2019 Comorbid Anemia, Sleep Apnea, Arrhythmia, Congestive Heart Failure, Weeks Of Treatment: 55 History: Coronary Artery Disease, Hypertension, Myocardial Infarction, Clustered Wound: No Type II Diabetes, Gout, Neuropathy Photos Wound Measurements Length: (cm) 1 Width: (cm) 0.4 Depth: (cm) 0.1 Area: (cm) 0.314 Volume: (cm) 0.031 % Reduction in Area: 88.5% % Reduction in Volume: 88.6% Epithelialization: Medium (34-66%) Tunneling: No Undermining: No Wound Description Classification: Full Thickness Without Exposed Support Structures Wound Margin: Flat and Intact Exudate Amount: Medium Exudate Type: Serosanguineous Exudate Color: red, brown Foul Odor After Cleansing: No Slough/Fibrino No Wound Bed Granulation Amount: Large (67-100%) Exposed Structure Granulation Quality: Red Fascia Exposed: No Necrotic Amount: None Present (0%) Fat Layer (Subcutaneous Tissue) Exposed: Yes Tendon Exposed: No Muscle Exposed: No Joint Exposed: No Bone Exposed: No Electronic Signature(s) Signed: 08/10/2020 10:33:47 AM By: Sandre Kitty Signed: 08/10/2020 6:20:01 PM By: Baruch Gouty RN, BSN Entered By: Sandre Kitty on 08/09/2020 16:24:48 -------------------------------------------------------------------------------- Wound Assessment Details Patient Name: Date of Service: [name] Foley F. 08/09/2020 9:30 A M Medical Record Number: [id number] Patient Account Number: [id number] Date of Birth/Sex: Treating RN: 12-Aug-1945 (75 y.o. Male) Baruch Gouty Primary Care Jahmad Petrich: [name], [name] Other Clinician: Referring Chelci [place]: Treating Glenette Bookwalter/Extender: Hoffman, Jessica [name], [name] Weeks in Treatment: 225 Wound Status Wound Number: 26 Primary Diabetic Wound/Ulcer of the Lower Extremity Etiology: Wound Location: Left, Dorsal Foot Wound Open Wounding Event: Gradually Appeared Wounding Event: Gradually Appeared Status: Date Acquired: 08/18/2019 Comorbid Anemia, Sleep Apnea, Arrhythmia, Congestive Heart Failure, Weeks Of Treatment: 51 History: Coronary Artery Disease, Hypertension, Myocardial Infarction, Clustered Wound: Yes Type II Diabetes, Gout, Neuropathy Photos Wound Measurements Length: (cm) 3.6 Width: (cm) 1 Depth: (cm) 0.1 Clustered Quantity: 4 Area: (cm) 2.827 Volume: (cm) 0.283 % Reduction in Area: -414% % Reduction in Volume: -414.5% Epithelialization: Small (1-33%) Tunneling: No Undermining: No Wound Description Classification: Grade 2 Wound Margin: Distinct, outline attached Exudate Amount: Medium Exudate Type: Purulent Exudate Color: yellow, brown, green Foul Odor After Cleansing: No Slough/Fibrino Yes Wound Bed Granulation Amount: Small (1-33%) Exposed Structure Granulation Quality: Red, Pink Fascia Exposed: No Necrotic Amount: Large (67-100%) Fat Layer (Subcutaneous Tissue) Exposed: Yes Necrotic Quality: Eschar, Adherent Slough Tendon Exposed: Yes Muscle Exposed: No Joint Exposed: No Bone Exposed: No Electronic Signature(s) Signed: 08/10/2020 10:33:47 AM By: Sandre Kitty Signed: 08/10/2020 6:20:01 PM By: Baruch Gouty RN, BSN Entered By:  Sandre Kitty on 08/09/2020 16:32:57 -------------------------------------------------------------------------------- Wound Assessment Details Patient Name: Date of Service: [name] Foley F. 08/09/2020 9:30 A M Medical Record Number: [id number] Patient Account Number: [id number] Date of Birth/Sex: Treating RN: 05/05/1945 (75 y.o. Male) Baruch Gouty Primary Care Kailia Starry: [name], [name] Other Clinician: Referring Tambra Muller: Treating Adin Lariccia/Extender: Hoffman, Jessica [name], [name] Weeks in Treatment: 225 Wound Status Wound Number: 28 Primary Diabetic Wound/Ulcer of the Lower Extremity Etiology: Wound Location: Right, Dorsal Foot Wound Open Wounding Event: Gradually Appeared Status: Date Acquired: 04/26/2020 Comorbid Anemia, Sleep Apnea, Arrhythmia, Congestive Heart Failure, Weeks Of Treatment: 15 History: Coronary Artery Disease, Hypertension, Myocardial Infarction, Clustered Wound: Yes Type II Diabetes, Gout, Neuropathy Wound Measurements Length: (cm) 0.1 Width: (cm) 0.1 Depth: (cm) 0.1 Clustered Quantity: 2 Area: (cm) 0.008 Volume: (cm) 0.001 % Reduction in Area: 99.2% % Reduction in Volume: 99% Epithelialization: Large (67-100%) Tunneling: No Undermining: No Wound Description Classification: Grade 1 Wound Margin: Indistinct, nonvisible Exudate Amount:  Treating Haruna Rohlfs/Extender: Hoffman, Jessica [name], [name] Weeks in Treatment: 225 Wound Status Wound Number: 25 Primary Venous Leg Ulcer Etiology: Wound Location: Right, Lateral Lower Leg Wound Open Wounding Event: Gradually Appeared Status: Date Acquired: 07/06/2019 Comorbid Anemia, Sleep Apnea, Arrhythmia, Congestive Heart Failure, Weeks Of Treatment: 55 History: Coronary Artery Disease, Hypertension, Myocardial Infarction, Clustered Wound: No Type II Diabetes, Gout, Neuropathy Photos Wound Measurements Length: (cm) 1 Width: (cm) 0.4 Depth: (cm) 0.1 Area: (cm) 0.314 Volume: (cm) 0.031 % Reduction in Area: 88.5% % Reduction in Volume: 88.6% Epithelialization: Medium (34-66%) Tunneling: No Undermining: No Wound Description Classification: Full Thickness Without Exposed Support Structures Wound Margin: Flat and Intact Exudate Amount: Medium Exudate Type: Serosanguineous Exudate Color: red, brown Foul Odor After Cleansing: No Slough/Fibrino No Wound Bed Granulation Amount: Large (67-100%) Exposed Structure Granulation Quality: Red Fascia Exposed: No Necrotic Amount: None Present (0%) Fat Layer (Subcutaneous Tissue) Exposed: Yes Tendon Exposed: No Muscle Exposed: No Joint Exposed: No Bone Exposed: No Electronic Signature(s) Signed: 08/10/2020 10:33:47 AM By: Sandre Kitty Signed: 08/10/2020 6:20:01 PM By: Baruch Gouty RN, BSN Entered By: Sandre Kitty on 08/09/2020 16:24:48 -------------------------------------------------------------------------------- Wound Assessment Details Patient Name: Date of Service: [name] Foley F. 08/09/2020 9:30 A M Medical Record Number: [id number] Patient Account Number: [id number] Date of Birth/Sex: Treating RN: 12-Aug-1945 (75 y.o. Male) Baruch Gouty Primary Care Jahmad Petrich: [name], [name] Other Clinician: Referring Chelci [place]: Treating Glenette Bookwalter/Extender: Hoffman, Jessica [name], [name] Weeks in Treatment: 225 Wound Status Wound Number: 26 Primary Diabetic Wound/Ulcer of the Lower Extremity Etiology: Wound Location: Left, Dorsal Foot Wound Open Wounding Event: Gradually Appeared Wounding Event: Gradually Appeared Status: Date Acquired: 08/18/2019 Comorbid Anemia, Sleep Apnea, Arrhythmia, Congestive Heart Failure, Weeks Of Treatment: 51 History: Coronary Artery Disease, Hypertension, Myocardial Infarction, Clustered Wound: Yes Type II Diabetes, Gout, Neuropathy Photos Wound Measurements Length: (cm) 3.6 Width: (cm) 1 Depth: (cm) 0.1 Clustered Quantity: 4 Area: (cm) 2.827 Volume: (cm) 0.283 % Reduction in Area: -414% % Reduction in Volume: -414.5% Epithelialization: Small (1-33%) Tunneling: No Undermining: No Wound Description Classification: Grade 2 Wound Margin: Distinct, outline attached Exudate Amount: Medium Exudate Type: Purulent Exudate Color: yellow, brown, green Foul Odor After Cleansing: No Slough/Fibrino Yes Wound Bed Granulation Amount: Small (1-33%) Exposed Structure Granulation Quality: Red, Pink Fascia Exposed: No Necrotic Amount: Large (67-100%) Fat Layer (Subcutaneous Tissue) Exposed: Yes Necrotic Quality: Eschar, Adherent Slough Tendon Exposed: Yes Muscle Exposed: No Joint Exposed: No Bone Exposed: No Electronic Signature(s) Signed: 08/10/2020 10:33:47 AM By: Sandre Kitty Signed: 08/10/2020 6:20:01 PM By: Baruch Gouty RN, BSN Entered By:  Sandre Kitty on 08/09/2020 16:32:57 -------------------------------------------------------------------------------- Wound Assessment Details Patient Name: Date of Service: [name] Foley F. 08/09/2020 9:30 A M Medical Record Number: [id number] Patient Account Number: [id number] Date of Birth/Sex: Treating RN: 05/05/1945 (75 y.o. Male) Baruch Gouty Primary Care Kailia Starry: [name], [name] Other Clinician: Referring Tambra Muller: Treating Adin Lariccia/Extender: Hoffman, Jessica [name], [name] Weeks in Treatment: 225 Wound Status Wound Number: 28 Primary Diabetic Wound/Ulcer of the Lower Extremity Etiology: Wound Location: Right, Dorsal Foot Wound Open Wounding Event: Gradually Appeared Status: Date Acquired: 04/26/2020 Comorbid Anemia, Sleep Apnea, Arrhythmia, Congestive Heart Failure, Weeks Of Treatment: 15 History: Coronary Artery Disease, Hypertension, Myocardial Infarction, Clustered Wound: Yes Type II Diabetes, Gout, Neuropathy Wound Measurements Length: (cm) 0.1 Width: (cm) 0.1 Depth: (cm) 0.1 Clustered Quantity: 2 Area: (cm) 0.008 Volume: (cm) 0.001 % Reduction in Area: 99.2% % Reduction in Volume: 99% Epithelialization: Large (67-100%) Tunneling: No Undermining: No Wound Description Classification: Grade 1 Wound Margin: Indistinct, nonvisible Exudate Amount:  Medium Exudate Type: Serosanguineous Exudate Color: red, brown Foul Odor After Cleansing: No Slough/Fibrino No Wound Bed Granulation Amount: None Present (0%) Exposed Structure Necrotic Amount: None Present (0%) Fascia Exposed: No Fat Layer (Subcutaneous Tissue) Exposed: No Tendon Exposed: No Muscle Exposed: No Joint Exposed: No Bone Exposed: No Electronic Signature(s) Signed: 08/10/2020 6:20:01 PM By: Baruch Gouty RN, BSN Signed: 08/26/2020 3:14:58 PM By: [name] Entered By: [name] on 08/09/2020  11:50:32 -------------------------------------------------------------------------------- Wound Assessment Details Patient Name: Date of Service: [name] Foley F. 08/09/2020 9:30 A M Medical Record Number: [id number] Patient Account Number: [id number] Date of Birth/Sex: Treating RN: December 09, 1945 (75 y.o. Male) Baruch Gouty Primary Care Persis Graffius: [name], [name] Other Clinician: Referring Nevea Spiewak: Treating Jeovanni Heuring/Extender: Hoffman, Jessica [name], [name] Weeks in Treatment: 225 Wound Status Wound Number: 32 Primary Diabetic Wound/Ulcer of the Lower Extremity Etiology: Wound Location: Left, Distal, Dorsal Foot Wound Open Wounding Event: Gradually Appeared Status: Date Acquired: 05/03/2020 Comorbid Anemia, Sleep Apnea, Arrhythmia, Congestive Heart Failure, Weeks Of Treatment: 14 History: Coronary Artery Disease, Hypertension, Myocardial Infarction, Clustered Wound: No Type II Diabetes, Gout, Neuropathy Photos Wound Measurements Length: (cm) 0.1 Width: (cm) 0.1 Depth: (cm) 0.1 Area: (cm) 0.008 Volume: (cm) 0.001 % Reduction in Area: 99.5% % Reduction in Volume: 99.4% Epithelialization: Large (67-100%) Tunneling: No Undermining: No Wound Description Classification: Grade 2 Wound Margin: Indistinct, nonvisible Exudate Amount: Medium Exudate Type: Serosanguineous Exudate Color: red, brown Foul Odor After Cleansing: No Slough/Fibrino No Wound Bed Granulation Amount: None Present (0%) Exposed Structure Necrotic Amount: None Present (0%) Fascia Exposed: No Fat Layer (Subcutaneous Tissue) Exposed: No Tendon Exposed: No Muscle Exposed: No Joint Exposed: No Bone Exposed: No Electronic Signature(s) Signed: 08/10/2020 10:33:47 AM By: Sandre Kitty Signed: 08/10/2020 6:20:01 PM By: Baruch Gouty RN, BSN Entered By: Sandre Kitty on 08/09/2020 16:31:29 -------------------------------------------------------------------------------- Wound Assessment  Details Patient Name: Date of Service: [name] Foley F. 08/09/2020 9:30 A M Medical Record Number: [id number] Patient Account Number: [id number] Date of Birth/Sex: Treating RN: 1945/08/07 (75 y.o. Male) Baruch Gouty Primary Care Patsie Mccardle: [name], [name] Other Clinician: Referring Loomis Anacker: Treating Nicholette Dolson/Extender: Hoffman, Jessica [name], [name] Weeks in Treatment: 225 Wound Status Wound Number: 34 Primary Diabetic Wound/Ulcer of the Lower Extremity Etiology: Wound Location: Right, Medial, Dorsal Foot Wound Open Wounding Event: Gradually Appeared Status: Date Acquired: 06/14/2020 Comorbid Anemia, Sleep Apnea, Arrhythmia, Congestive Heart Failure, Weeks Of Treatment: 8 History: Coronary Artery Disease, Hypertension, Myocardial Infarction, Clustered Wound: No Type II Diabetes, Gout, Neuropathy Photos Wound Measurements Length: (cm) 1.2 Width: (cm) 0.7 Depth: (cm) 0.2 Area: (cm) 0.66 Volume: (cm) 0.132 % Reduction in Area: -140% % Reduction in Volume: -388.9% Epithelialization: Small (1-33%) Tunneling: No Undermining: No Wound Description Classification: Grade 2 Wound Margin: Well defined, not attached Exudate Amount: Medium Exudate Type: Serosanguineous Exudate Color: red, brown Foul Odor After Cleansing: No Slough/Fibrino Yes Wound Bed Granulation Amount: Small (1-33%) Exposed Structure Granulation Quality: Red Fascia Exposed: No Necrotic Amount: Large (67-100%) Fat Layer (Subcutaneous Tissue) Exposed: Yes Necrotic Quality: Adherent Slough Tendon Exposed: No Muscle Exposed: No Joint Exposed: No Bone Exposed: No Electronic Signature(s) Signed: 08/10/2020 10:33:47 AM By: Sandre Kitty Signed: 08/10/2020 6:20:01 PM By: Baruch Gouty RN, BSN Entered By: Sandre Kitty on 08/09/2020 16:27:14 -------------------------------------------------------------------------------- Wound Assessment Details Patient Name: Date of Service: [name] Foley F.  08/09/2020 9:30 A M Medical Record Number: [id number] Patient Account Number: [id number] Date of Birth/Sex: Treating RN: 1946/03/05 (75 y.o. Male) Baruch Gouty Primary Care Deah Ottaway: [name], [name] Other Clinician: Referring Mayra Jolliffe: Treating Edelin Fryer/Extender:  Medium Exudate Type: Serosanguineous Exudate Color: red, brown Foul Odor After Cleansing: No Slough/Fibrino No Wound Bed Granulation Amount: None Present (0%) Exposed Structure Necrotic Amount: None Present (0%) Fascia Exposed: No Fat Layer (Subcutaneous Tissue) Exposed: No Tendon Exposed: No Muscle Exposed: No Joint Exposed: No Bone Exposed: No Electronic Signature(s) Signed: 08/10/2020 6:20:01 PM By: Baruch Gouty RN, BSN Signed: 08/26/2020 3:14:58 PM By: [name] Entered By: [name] on 08/09/2020  11:50:32 -------------------------------------------------------------------------------- Wound Assessment Details Patient Name: Date of Service: [name] Foley F. 08/09/2020 9:30 A M Medical Record Number: [id number] Patient Account Number: [id number] Date of Birth/Sex: Treating RN: December 09, 1945 (75 y.o. Male) Baruch Gouty Primary Care Persis Graffius: [name], [name] Other Clinician: Referring Nevea Spiewak: Treating Jeovanni Heuring/Extender: Hoffman, Jessica [name], [name] Weeks in Treatment: 225 Wound Status Wound Number: 32 Primary Diabetic Wound/Ulcer of the Lower Extremity Etiology: Wound Location: Left, Distal, Dorsal Foot Wound Open Wounding Event: Gradually Appeared Status: Date Acquired: 05/03/2020 Comorbid Anemia, Sleep Apnea, Arrhythmia, Congestive Heart Failure, Weeks Of Treatment: 14 History: Coronary Artery Disease, Hypertension, Myocardial Infarction, Clustered Wound: No Type II Diabetes, Gout, Neuropathy Photos Wound Measurements Length: (cm) 0.1 Width: (cm) 0.1 Depth: (cm) 0.1 Area: (cm) 0.008 Volume: (cm) 0.001 % Reduction in Area: 99.5% % Reduction in Volume: 99.4% Epithelialization: Large (67-100%) Tunneling: No Undermining: No Wound Description Classification: Grade 2 Wound Margin: Indistinct, nonvisible Exudate Amount: Medium Exudate Type: Serosanguineous Exudate Color: red, brown Foul Odor After Cleansing: No Slough/Fibrino No Wound Bed Granulation Amount: None Present (0%) Exposed Structure Necrotic Amount: None Present (0%) Fascia Exposed: No Fat Layer (Subcutaneous Tissue) Exposed: No Tendon Exposed: No Muscle Exposed: No Joint Exposed: No Bone Exposed: No Electronic Signature(s) Signed: 08/10/2020 10:33:47 AM By: Sandre Kitty Signed: 08/10/2020 6:20:01 PM By: Baruch Gouty RN, BSN Entered By: Sandre Kitty on 08/09/2020 16:31:29 -------------------------------------------------------------------------------- Wound Assessment  Details Patient Name: Date of Service: [name] Foley F. 08/09/2020 9:30 A M Medical Record Number: [id number] Patient Account Number: [id number] Date of Birth/Sex: Treating RN: 1945/08/07 (75 y.o. Male) Baruch Gouty Primary Care Patsie Mccardle: [name], [name] Other Clinician: Referring Loomis Anacker: Treating Nicholette Dolson/Extender: Hoffman, Jessica [name], [name] Weeks in Treatment: 225 Wound Status Wound Number: 34 Primary Diabetic Wound/Ulcer of the Lower Extremity Etiology: Wound Location: Right, Medial, Dorsal Foot Wound Open Wounding Event: Gradually Appeared Status: Date Acquired: 06/14/2020 Comorbid Anemia, Sleep Apnea, Arrhythmia, Congestive Heart Failure, Weeks Of Treatment: 8 History: Coronary Artery Disease, Hypertension, Myocardial Infarction, Clustered Wound: No Type II Diabetes, Gout, Neuropathy Photos Wound Measurements Length: (cm) 1.2 Width: (cm) 0.7 Depth: (cm) 0.2 Area: (cm) 0.66 Volume: (cm) 0.132 % Reduction in Area: -140% % Reduction in Volume: -388.9% Epithelialization: Small (1-33%) Tunneling: No Undermining: No Wound Description Classification: Grade 2 Wound Margin: Well defined, not attached Exudate Amount: Medium Exudate Type: Serosanguineous Exudate Color: red, brown Foul Odor After Cleansing: No Slough/Fibrino Yes Wound Bed Granulation Amount: Small (1-33%) Exposed Structure Granulation Quality: Red Fascia Exposed: No Necrotic Amount: Large (67-100%) Fat Layer (Subcutaneous Tissue) Exposed: Yes Necrotic Quality: Adherent Slough Tendon Exposed: No Muscle Exposed: No Joint Exposed: No Bone Exposed: No Electronic Signature(s) Signed: 08/10/2020 10:33:47 AM By: Sandre Kitty Signed: 08/10/2020 6:20:01 PM By: Baruch Gouty RN, BSN Entered By: Sandre Kitty on 08/09/2020 16:27:14 -------------------------------------------------------------------------------- Wound Assessment Details Patient Name: Date of Service: [name] Foley F.  08/09/2020 9:30 A M Medical Record Number: [id number] Patient Account Number: [id number] Date of Birth/Sex: Treating RN: 1946/03/05 (75 y.o. Male) Baruch Gouty Primary Care Deah Ottaway: [name], [name] Other Clinician: Referring Mayra Jolliffe: Treating Edelin Fryer/Extender:  Hoffman, Jessica [name], [name] Weeks in Treatment: 225 Wound Status Wound Number: 35 Primary Diabetic Wound/Ulcer of the Lower Extremity Etiology: Wound Location: Left, Lateral, Dorsal Foot Wound Open Wounding Event: Gradually Appeared Status: Date Acquired: 05/24/2020 Comorbid Anemia, Sleep Apnea, Arrhythmia, Congestive Heart Failure, Weeks Of Treatment: 8 History: Coronary Artery Disease, Hypertension, Myocardial Infarction, Clustered Wound: No Type II Diabetes, Gout, Neuropathy Photos Wound Measurements Length: (cm) 1.3 Width: (cm) 1.8 Depth: (cm) 0.5 Area: (cm) 1.838 Volume: (cm) 0.919 % Reduction in Area: -290.2% % Reduction in Volume: -289.4% Epithelialization: None Tunneling: No Undermining: No Wound Description Classification: Grade 2 Wound Margin: Distinct, outline attached Exudate Amount: Medium Exudate Type: Purulent Exudate Color: yellow, brown, green Foul Odor After Cleansing: No Slough/Fibrino Yes Wound Bed Granulation Amount: Medium (34-66%) Exposed Structure Granulation Quality: Red, Hyper-granulation Fascia Exposed: No Necrotic Amount: Medium (34-66%) Fat Layer (Subcutaneous Tissue) Exposed: Yes Necrotic Quality: Adherent Slough Tendon Exposed: No Muscle Exposed: No Joint Exposed: No Bone Exposed: Yes Electronic Signature(s) Signed: 08/10/2020 10:33:47 AM By: Sandre Kitty Signed: 08/10/2020 6:20:01 PM By: Baruch Gouty RN, BSN Entered By: Sandre Kitty on 08/09/2020 16:35:02 -------------------------------------------------------------------------------- Wound Assessment Details Patient Name: Date of Service: [name] Foley F. 08/09/2020 9:30 A M Medical Record Number:  [id number] Patient Account Number: [id number] Date of Birth/Sex: Treating RN: October 08, 1945 (75 y.o. Male) Baruch Gouty Primary Care Chenae Brager: [name], [name] Other Clinician: Referring Naithan Delage: Treating Chalice Philbert/Extender: Hoffman, Jessica [name], [name] Weeks in Treatment: 225 Wound Status Wound Number: 36 Primary Venous Leg Ulcer Etiology: Wound Location: Left, Lateral Foot Wound Open Wounding Event: Gradually Appeared Status: Date Acquired: 07/19/2020 Comorbid Anemia, Sleep Apnea, Arrhythmia, Congestive Heart Failure, Weeks Of Treatment: 2 History: Coronary Artery Disease, Hypertension, Myocardial Infarction, Clustered Wound: No Type II Diabetes, Gout, Neuropathy Photos Wound Measurements Length: (cm) 2.4 Width: (cm) 2 Depth: (cm) 0.6 Area: (cm) 3.77 Volume: (cm) 2.262 % Reduction in Area: 12.1% % Reduction in Volume: -163.6% Epithelialization: None Tunneling: No Undermining: No Wound Description Classification: Full Thickness Without Exposed Support Structures Wound Margin: Flat and Intact Exudate Amount: Medium Exudate Type: Serosanguineous Exudate Color: red, brown Foul Odor After Cleansing: No Slough/Fibrino Yes Wound Bed Granulation Amount: Small (1-33%) Exposed Structure Granulation Quality: Pink Fascia Exposed: No Necrotic Amount: Large (67-100%) Fat Layer (Subcutaneous Tissue) Exposed: Yes Necrotic Quality: Adherent Slough Tendon Exposed: No Muscle Exposed: No Joint Exposed: No Bone Exposed: No Electronic Signature(s) Signed: 08/10/2020 6:20:01 PM By: Baruch Gouty RN, BSN Signed: 08/26/2020 3:14:58 PM By: [name] Entered By: [name] on 08/09/2020 17:02:53 -------------------------------------------------------------------------------- Vitals Details Patient Name: Date of Service: [name] Foley F. 08/09/2020 9:30 A M Medical Record Number: [id number] Patient Account Number: [id number] Date of Birth/Sex: Treating  RN: [birth year] (75 y.o. Male) [name] Primary Care Signe Tackitt: [name], [name] Other Clinician: Referring Asad Keeven: Treating Blyss Lugar/Extender: Hoffman, Jessica [name], [name] Weeks in Treatment: 225 Vital Signs Time Taken: 09:52 Temperature (F): 98.2 Height (in): 74 Pulse (bpm): 74 Weight (lbs): 150 Respiratory Rate (breaths/min): 18 Body Mass Index (BMI): 19.3 Blood Pressure (mmHg): 98/59 Capillary Blood Glucose (mg/dl): 124 Reference Range: 80 - 120 mg / dl Electronic Signature(s) Signed: 08/09/2020 10:57:26 AM By: Sandre Kitty Entered By: Sandre Kitty on 08/09/2020 09:53:09  Treating Haruna Rohlfs/Extender: Hoffman, Jessica [name], [name] Weeks in Treatment: 225 Wound Status Wound Number: 25 Primary Venous Leg Ulcer Etiology: Wound Location: Right, Lateral Lower Leg Wound Open Wounding Event: Gradually Appeared Status: Date Acquired: 07/06/2019 Comorbid Anemia, Sleep Apnea, Arrhythmia, Congestive Heart Failure, Weeks Of Treatment: 55 History: Coronary Artery Disease, Hypertension, Myocardial Infarction, Clustered Wound: No Type II Diabetes, Gout, Neuropathy Photos Wound Measurements Length: (cm) 1 Width: (cm) 0.4 Depth: (cm) 0.1 Area: (cm) 0.314 Volume: (cm) 0.031 % Reduction in Area: 88.5% % Reduction in Volume: 88.6% Epithelialization: Medium (34-66%) Tunneling: No Undermining: No Wound Description Classification: Full Thickness Without Exposed Support Structures Wound Margin: Flat and Intact Exudate Amount: Medium Exudate Type: Serosanguineous Exudate Color: red, brown Foul Odor After Cleansing: No Slough/Fibrino No Wound Bed Granulation Amount: Large (67-100%) Exposed Structure Granulation Quality: Red Fascia Exposed: No Necrotic Amount: None Present (0%) Fat Layer (Subcutaneous Tissue) Exposed: Yes Tendon Exposed: No Muscle Exposed: No Joint Exposed: No Bone Exposed: No Electronic Signature(s) Signed: 08/10/2020 10:33:47 AM By: Sandre Kitty Signed: 08/10/2020 6:20:01 PM By: Baruch Gouty RN, BSN Entered By: Sandre Kitty on 08/09/2020 16:24:48 -------------------------------------------------------------------------------- Wound Assessment Details Patient Name: Date of Service: [name] Foley F. 08/09/2020 9:30 A M Medical Record Number: [id number] Patient Account Number: [id number] Date of Birth/Sex: Treating RN: 12-Aug-1945 (75 y.o. Male) Baruch Gouty Primary Care Jahmad Petrich: [name], [name] Other Clinician: Referring Chelci [place]: Treating Glenette Bookwalter/Extender: Hoffman, Jessica [name], [name] Weeks in Treatment: 225 Wound Status Wound Number: 26 Primary Diabetic Wound/Ulcer of the Lower Extremity Etiology: Wound Location: Left, Dorsal Foot Wound Open Wounding Event: Gradually Appeared Wounding Event: Gradually Appeared Status: Date Acquired: 08/18/2019 Comorbid Anemia, Sleep Apnea, Arrhythmia, Congestive Heart Failure, Weeks Of Treatment: 51 History: Coronary Artery Disease, Hypertension, Myocardial Infarction, Clustered Wound: Yes Type II Diabetes, Gout, Neuropathy Photos Wound Measurements Length: (cm) 3.6 Width: (cm) 1 Depth: (cm) 0.1 Clustered Quantity: 4 Area: (cm) 2.827 Volume: (cm) 0.283 % Reduction in Area: -414% % Reduction in Volume: -414.5% Epithelialization: Small (1-33%) Tunneling: No Undermining: No Wound Description Classification: Grade 2 Wound Margin: Distinct, outline attached Exudate Amount: Medium Exudate Type: Purulent Exudate Color: yellow, brown, green Foul Odor After Cleansing: No Slough/Fibrino Yes Wound Bed Granulation Amount: Small (1-33%) Exposed Structure Granulation Quality: Red, Pink Fascia Exposed: No Necrotic Amount: Large (67-100%) Fat Layer (Subcutaneous Tissue) Exposed: Yes Necrotic Quality: Eschar, Adherent Slough Tendon Exposed: Yes Muscle Exposed: No Joint Exposed: No Bone Exposed: No Electronic Signature(s) Signed: 08/10/2020 10:33:47 AM By: Sandre Kitty Signed: 08/10/2020 6:20:01 PM By: Baruch Gouty RN, BSN Entered By:  Sandre Kitty on 08/09/2020 16:32:57 -------------------------------------------------------------------------------- Wound Assessment Details Patient Name: Date of Service: [name] Foley F. 08/09/2020 9:30 A M Medical Record Number: [id number] Patient Account Number: [id number] Date of Birth/Sex: Treating RN: 05/05/1945 (75 y.o. Male) Baruch Gouty Primary Care Kailia Starry: [name], [name] Other Clinician: Referring Tambra Muller: Treating Adin Lariccia/Extender: Hoffman, Jessica [name], [name] Weeks in Treatment: 225 Wound Status Wound Number: 28 Primary Diabetic Wound/Ulcer of the Lower Extremity Etiology: Wound Location: Right, Dorsal Foot Wound Open Wounding Event: Gradually Appeared Status: Date Acquired: 04/26/2020 Comorbid Anemia, Sleep Apnea, Arrhythmia, Congestive Heart Failure, Weeks Of Treatment: 15 History: Coronary Artery Disease, Hypertension, Myocardial Infarction, Clustered Wound: Yes Type II Diabetes, Gout, Neuropathy Wound Measurements Length: (cm) 0.1 Width: (cm) 0.1 Depth: (cm) 0.1 Clustered Quantity: 2 Area: (cm) 0.008 Volume: (cm) 0.001 % Reduction in Area: 99.2% % Reduction in Volume: 99% Epithelialization: Large (67-100%) Tunneling: No Undermining: No Wound Description Classification: Grade 1 Wound Margin: Indistinct, nonvisible Exudate Amount:  Medium Exudate Type: Serosanguineous Exudate Color: red, brown Foul Odor After Cleansing: No Slough/Fibrino No Wound Bed Granulation Amount: None Present (0%) Exposed Structure Necrotic Amount: None Present (0%) Fascia Exposed: No Fat Layer (Subcutaneous Tissue) Exposed: No Tendon Exposed: No Muscle Exposed: No Joint Exposed: No Bone Exposed: No Electronic Signature(s) Signed: 08/10/2020 6:20:01 PM By: Baruch Gouty RN, BSN Signed: 08/26/2020 3:14:58 PM By: [name] Entered By: [name] on 08/09/2020  11:50:32 -------------------------------------------------------------------------------- Wound Assessment Details Patient Name: Date of Service: [name] Foley F. 08/09/2020 9:30 A M Medical Record Number: [id number] Patient Account Number: [id number] Date of Birth/Sex: Treating RN: December 09, 1945 (75 y.o. Male) Baruch Gouty Primary Care Persis Graffius: [name], [name] Other Clinician: Referring Nevea Spiewak: Treating Jeovanni Heuring/Extender: Hoffman, Jessica [name], [name] Weeks in Treatment: 225 Wound Status Wound Number: 32 Primary Diabetic Wound/Ulcer of the Lower Extremity Etiology: Wound Location: Left, Distal, Dorsal Foot Wound Open Wounding Event: Gradually Appeared Status: Date Acquired: 05/03/2020 Comorbid Anemia, Sleep Apnea, Arrhythmia, Congestive Heart Failure, Weeks Of Treatment: 14 History: Coronary Artery Disease, Hypertension, Myocardial Infarction, Clustered Wound: No Type II Diabetes, Gout, Neuropathy Photos Wound Measurements Length: (cm) 0.1 Width: (cm) 0.1 Depth: (cm) 0.1 Area: (cm) 0.008 Volume: (cm) 0.001 % Reduction in Area: 99.5% % Reduction in Volume: 99.4% Epithelialization: Large (67-100%) Tunneling: No Undermining: No Wound Description Classification: Grade 2 Wound Margin: Indistinct, nonvisible Exudate Amount: Medium Exudate Type: Serosanguineous Exudate Color: red, brown Foul Odor After Cleansing: No Slough/Fibrino No Wound Bed Granulation Amount: None Present (0%) Exposed Structure Necrotic Amount: None Present (0%) Fascia Exposed: No Fat Layer (Subcutaneous Tissue) Exposed: No Tendon Exposed: No Muscle Exposed: No Joint Exposed: No Bone Exposed: No Electronic Signature(s) Signed: 08/10/2020 10:33:47 AM By: Sandre Kitty Signed: 08/10/2020 6:20:01 PM By: Baruch Gouty RN, BSN Entered By: Sandre Kitty on 08/09/2020 16:31:29 -------------------------------------------------------------------------------- Wound Assessment  Details Patient Name: Date of Service: [name] Foley F. 08/09/2020 9:30 A M Medical Record Number: [id number] Patient Account Number: [id number] Date of Birth/Sex: Treating RN: 1945/08/07 (75 y.o. Male) Baruch Gouty Primary Care Patsie Mccardle: [name], [name] Other Clinician: Referring Loomis Anacker: Treating Nicholette Dolson/Extender: Hoffman, Jessica [name], [name] Weeks in Treatment: 225 Wound Status Wound Number: 34 Primary Diabetic Wound/Ulcer of the Lower Extremity Etiology: Wound Location: Right, Medial, Dorsal Foot Wound Open Wounding Event: Gradually Appeared Status: Date Acquired: 06/14/2020 Comorbid Anemia, Sleep Apnea, Arrhythmia, Congestive Heart Failure, Weeks Of Treatment: 8 History: Coronary Artery Disease, Hypertension, Myocardial Infarction, Clustered Wound: No Type II Diabetes, Gout, Neuropathy Photos Wound Measurements Length: (cm) 1.2 Width: (cm) 0.7 Depth: (cm) 0.2 Area: (cm) 0.66 Volume: (cm) 0.132 % Reduction in Area: -140% % Reduction in Volume: -388.9% Epithelialization: Small (1-33%) Tunneling: No Undermining: No Wound Description Classification: Grade 2 Wound Margin: Well defined, not attached Exudate Amount: Medium Exudate Type: Serosanguineous Exudate Color: red, brown Foul Odor After Cleansing: No Slough/Fibrino Yes Wound Bed Granulation Amount: Small (1-33%) Exposed Structure Granulation Quality: Red Fascia Exposed: No Necrotic Amount: Large (67-100%) Fat Layer (Subcutaneous Tissue) Exposed: Yes Necrotic Quality: Adherent Slough Tendon Exposed: No Muscle Exposed: No Joint Exposed: No Bone Exposed: No Electronic Signature(s) Signed: 08/10/2020 10:33:47 AM By: Sandre Kitty Signed: 08/10/2020 6:20:01 PM By: Baruch Gouty RN, BSN Entered By: Sandre Kitty on 08/09/2020 16:27:14 -------------------------------------------------------------------------------- Wound Assessment Details Patient Name: Date of Service: [name] Foley F.  08/09/2020 9:30 A M Medical Record Number: [id number] Patient Account Number: [id number] Date of Birth/Sex: Treating RN: 1946/03/05 (75 y.o. Male) Baruch Gouty Primary Care Deah Ottaway: [name], [name] Other Clinician: Referring Mayra Jolliffe: Treating Edelin Fryer/Extender:

## 2020-08-26 NOTE — Progress Notes (Signed)
and feet possibly contributing to the difficulty healing these areas. I will need to look over all of this. He is a diabetic with polyneuropathy but I am not sure whether this is the issue here. He claims to have good sensation in his foot and complains of no pain 4/5; the patient comes in today with the erythema on the dorsal foot on the left slightly better. Culture I did last time showed moderate strep group B. Fortunately had already put him on cephalexin and the erythema seems to be better. However he comes in today with a new wound on the left lateral fifth met head completely necrotic surface. This is not dissimilar to the other wounds he seems to be developing. He does not have obviously correctable macrovascular problem. I wonder whether he has microvascular disease or perhaps some other form of ischemic challenge such as micro emboli or vasculitis etc. 4/19; Patient presents for 2-week follow-up. He has no complaints today. He finished his course of antibiotics scheduled at last visit for a culture that grew moderate strep group B. He has no new wounds today. His daughter continues to do his dressings Electronic Signature(s) Signed: 08/09/2020 12:32:53 PM By: Kalman Shan DO Entered By: Kalman Shan on 08/09/2020 12:16:33 -------------------------------------------------------------------------------- Physical Exam Details Patient Name: Date of Service: [name] 08/09/2020 9:30 A M Medical Record Number: [id number] Patient Account Number: [id number] Date of Birth/Sex: Treating RN: Mar 07, 1946 (75 y.o. Male) [name] Primary Care Provider: [name], [name] Other Clinician: Referring Provider: Treating Provider/Extender: Eddy Termine [name], [name] Weeks in Treatment: 225 Notes Right lower extremity: He has wounds to the lateral lower leg, dorsal foot, medial dorsal foot. Areas with necrotic tissue were debrided and these included the right medial dorsal foot. No signs of infection Present. Left lower extremity: He has wounds to the lateral lower leg, dorsal foot, distal foot, lateral foot, dorsal lateral foot. The wounds with necrotic tissue were debrided and these included the dorsal, lateral , and distal foot. The dorsal foot wound goes to bone. The lateral left foot has tendon exposed. No signs of infection. Electronic Signature(s) Signed: 08/09/2020 12:32:53 PM By: Kalman Shan DO Entered By: Kalman Shan on 08/09/2020 12:22:48 -------------------------------------------------------------------------------- Physician Orders Details Patient Name: Date of Service: [name] 08/09/2020 9:30 A M Medical Record Number: [id number] Patient Account Number: [id number] Date of Birth/Sex: Treating RN: 04/28/1945 (75 y.o. Male) Levan Hurst Primary Care Provider: [name], [name] Other Clinician: Referring Provider: Treating Provider/Extender: Avila Albritton [name], [name] Weeks in Treatment: 225 Verbal / Phone Orders: No Diagnosis Coding ICD-10 Coding Code Description E11.621 Type 2 diabetes mellitus with foot ulcer L97.518 Non-pressure chronic ulcer of other part of right foot with other specified severity L97.221 Non-pressure chronic  ulcer of left calf limited to breakdown of skin L97.321 Non-pressure chronic ulcer of left ankle limited to breakdown of skin L97.521 Non-pressure chronic ulcer of other part of left foot limited to breakdown of skin L97.524 Non-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 Follow-up Appointments ppointment in 2 weeks. - Dr. Dellia Nims Return A Bathing/ Shower/ Hygiene May shower with protection but do not get wound dressing(s) wet. Edema Control - Lymphedema / SCD / Other Elevate legs to the level of the heart or above for 30 minutes daily and/or when sitting, a frequency of: Avoid standing for long periods of time. Off-Loading Other: - multipodus boots to both feet Wound Treatment Wound #14 - Lower Leg Wound Laterality: Left,  and feet possibly contributing to the difficulty healing these areas. I will need to look over all of this. He is a diabetic with polyneuropathy but I am not sure whether this is the issue here. He claims to have good sensation in his foot and complains of no pain 4/5; the patient comes in today with the erythema on the dorsal foot on the left slightly better. Culture I did last time showed moderate strep group B. Fortunately had already put him on cephalexin and the erythema seems to be better. However he comes in today with a new wound on the left lateral fifth met head completely necrotic surface. This is not dissimilar to the other wounds he seems to be developing. He does not have obviously correctable macrovascular problem. I wonder whether he has microvascular disease or perhaps some other form of ischemic challenge such as micro emboli or vasculitis etc. 4/19; Patient presents for 2-week follow-up. He has no complaints today. He finished his course of antibiotics scheduled at last visit for a culture that grew moderate strep group B. He has no new wounds today. His daughter continues to do his dressings Electronic Signature(s) Signed: 08/09/2020 12:32:53 PM By: Kalman Shan DO Entered By: Kalman Shan on 08/09/2020 12:16:33 -------------------------------------------------------------------------------- Physical Exam Details Patient Name: Date of Service: [name] 08/09/2020 9:30 A M Medical Record Number: [id number] Patient Account Number: [id number] Date of Birth/Sex: Treating RN: Mar 07, 1946 (75 y.o. Male) [name] Primary Care Provider: [name], [name] Other Clinician: Referring Provider: Treating Provider/Extender: Eddy Termine [name], [name] Weeks in Treatment: 225 Notes Right lower extremity: He has wounds to the lateral lower leg, dorsal foot, medial dorsal foot. Areas with necrotic tissue were debrided and these included the right medial dorsal foot. No signs of infection Present. Left lower extremity: He has wounds to the lateral lower leg, dorsal foot, distal foot, lateral foot, dorsal lateral foot. The wounds with necrotic tissue were debrided and these included the dorsal, lateral , and distal foot. The dorsal foot wound goes to bone. The lateral left foot has tendon exposed. No signs of infection. Electronic Signature(s) Signed: 08/09/2020 12:32:53 PM By: Kalman Shan DO Entered By: Kalman Shan on 08/09/2020 12:22:48 -------------------------------------------------------------------------------- Physician Orders Details Patient Name: Date of Service: [name] 08/09/2020 9:30 A M Medical Record Number: [id number] Patient Account Number: [id number] Date of Birth/Sex: Treating RN: 04/28/1945 (75 y.o. Male) Levan Hurst Primary Care Provider: [name], [name] Other Clinician: Referring Provider: Treating Provider/Extender: Avila Albritton [name], [name] Weeks in Treatment: 225 Verbal / Phone Orders: No Diagnosis Coding ICD-10 Coding Code Description E11.621 Type 2 diabetes mellitus with foot ulcer L97.518 Non-pressure chronic ulcer of other part of right foot with other specified severity L97.221 Non-pressure chronic  ulcer of left calf limited to breakdown of skin L97.321 Non-pressure chronic ulcer of left ankle limited to breakdown of skin L97.521 Non-pressure chronic ulcer of other part of left foot limited to breakdown of skin L97.524 Non-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 Follow-up Appointments ppointment in 2 weeks. - Dr. Dellia Nims Return A Bathing/ Shower/ Hygiene May shower with protection but do not get wound dressing(s) wet. Edema Control - Lymphedema / SCD / Other Elevate legs to the level of the heart or above for 30 minutes daily and/or when sitting, a frequency of: Avoid standing for long periods of time. Off-Loading Other: - multipodus boots to both feet Wound Treatment Wound #14 - Lower Leg Wound Laterality: Left,  and feet possibly contributing to the difficulty healing these areas. I will need to look over all of this. He is a diabetic with polyneuropathy but I am not sure whether this is the issue here. He claims to have good sensation in his foot and complains of no pain 4/5; the patient comes in today with the erythema on the dorsal foot on the left slightly better. Culture I did last time showed moderate strep group B. Fortunately had already put him on cephalexin and the erythema seems to be better. However he comes in today with a new wound on the left lateral fifth met head completely necrotic surface. This is not dissimilar to the other wounds he seems to be developing. He does not have obviously correctable macrovascular problem. I wonder whether he has microvascular disease or perhaps some other form of ischemic challenge such as micro emboli or vasculitis etc. 4/19; Patient presents for 2-week follow-up. He has no complaints today. He finished his course of antibiotics scheduled at last visit for a culture that grew moderate strep group B. He has no new wounds today. His daughter continues to do his dressings Electronic Signature(s) Signed: 08/09/2020 12:32:53 PM By: Kalman Shan DO Entered By: Kalman Shan on 08/09/2020 12:16:33 -------------------------------------------------------------------------------- Physical Exam Details Patient Name: Date of Service: [name] 08/09/2020 9:30 A M Medical Record Number: [id number] Patient Account Number: [id number] Date of Birth/Sex: Treating RN: Mar 07, 1946 (75 y.o. Male) [name] Primary Care Provider: [name], [name] Other Clinician: Referring Provider: Treating Provider/Extender: Eddy Termine [name], [name] Weeks in Treatment: 225 Notes Right lower extremity: He has wounds to the lateral lower leg, dorsal foot, medial dorsal foot. Areas with necrotic tissue were debrided and these included the right medial dorsal foot. No signs of infection Present. Left lower extremity: He has wounds to the lateral lower leg, dorsal foot, distal foot, lateral foot, dorsal lateral foot. The wounds with necrotic tissue were debrided and these included the dorsal, lateral , and distal foot. The dorsal foot wound goes to bone. The lateral left foot has tendon exposed. No signs of infection. Electronic Signature(s) Signed: 08/09/2020 12:32:53 PM By: Kalman Shan DO Entered By: Kalman Shan on 08/09/2020 12:22:48 -------------------------------------------------------------------------------- Physician Orders Details Patient Name: Date of Service: [name] 08/09/2020 9:30 A M Medical Record Number: [id number] Patient Account Number: [id number] Date of Birth/Sex: Treating RN: 04/28/1945 (75 y.o. Male) Levan Hurst Primary Care Provider: [name], [name] Other Clinician: Referring Provider: Treating Provider/Extender: Avila Albritton [name], [name] Weeks in Treatment: 225 Verbal / Phone Orders: No Diagnosis Coding ICD-10 Coding Code Description E11.621 Type 2 diabetes mellitus with foot ulcer L97.518 Non-pressure chronic ulcer of other part of right foot with other specified severity L97.221 Non-pressure chronic  ulcer of left calf limited to breakdown of skin L97.321 Non-pressure chronic ulcer of left ankle limited to breakdown of skin L97.521 Non-pressure chronic ulcer of other part of left foot limited to breakdown of skin L97.524 Non-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 Follow-up Appointments ppointment in 2 weeks. - Dr. Dellia Nims Return A Bathing/ Shower/ Hygiene May shower with protection but do not get wound dressing(s) wet. Edema Control - Lymphedema / SCD / Other Elevate legs to the level of the heart or above for 30 minutes daily and/or when sitting, a frequency of: Avoid standing for long periods of time. Off-Loading Other: - multipodus boots to both feet Wound Treatment Wound #14 - Lower Leg Wound Laterality: Left,  and water prior to dressing change. Peri-Wound Care: Triamcinolone 15 (g) 1 x Per Week/15 Days Discharge Instructions: Use triamcinolone 15 (g) as directed Peri-Wound Care: Sween Lotion (Moisturizing lotion) 1 x Per Week/15 Days Discharge Instructions: Apply moisturizing lotion as directed Prim Dressing: KerraCel Ag Gelling Fiber Dressing, 4x5 in (silver alginate) (Generic) 1 x Per Week/15 Days ary Discharge Instructions: Apply silver alginate to wound bed as instructed Secondary Dressing: Woven Gauze Sponge, Non-Sterile 4x4 in (Generic) 1 x Per Week/15 Days Discharge Instructions: Apply over primary dressing as directed. Secondary Dressing: ABD Pad, 5x9 (Generic) 1 x Per Week/15 Days Discharge Instructions: Apply over primary dressing as directed. Com pression Wrap: Kerlix Roll 4.5x3.1 (in/yd) (Generic) 1 x Per Week/15 Days Discharge Instructions: Apply Kerlix and Coban compression as directed. Com pression Wrap: Coban Self-Adherent Wrap 4x5 (in/yd) (Generic) 1 x Per Week/15 Days Discharge Instructions: Apply over Kerlix as directed. 1. Kerlix/Coban with silver alginate 2. Follow-up in 2 weeks with Dr. Dellia Nims 3. In office sharp debridement Electronic Signature(s) Signed: 08/09/2020 12:32:53 PM By: Kalman Shan DO Entered By: Kalman Shan on 08/09/2020 12:25:31 -------------------------------------------------------------------------------- HxROS Details Patient Name: Date of Service: [name] 08/09/2020 9:30 A M Medical Record  Number: [id number] Patient Account Number: [id number] Date of Birth/Sex: Treating RN: 12-09-1945 (75 y.o. Male) [name] Primary Care Provider: [name], [name] Other Clinician: Referring Provider: Treating Provider/Extender: Hadlie Gipson [name], [name] Weeks in Treatment: 225 Information Obtained From Patient Constitutional Symptoms (General Health) Medical History: Past Medical History Notes: acute encephalopathy Eyes Medical History: Negative for: Cataracts; Glaucoma; Optic Neuritis Ear/Nose/Mouth/Throat Medical History: Negative for: Chronic sinus problems/congestion; Middle ear problems Hematologic/Lymphatic Medical History: Positive for: Anemia Negative for: Hemophilia; Human Immunodeficiency Virus; Lymphedema; Sickle Cell Disease Respiratory Medical History: Positive for: Sleep Apnea - unable to tolerate devices Negative for: Aspiration; Asthma; Chronic Obstructive Pulmonary Disease (COPD); Pneumothorax; Tuberculosis Past Medical History Notes: acute hypoxic resp. failure due to aspiration pneumonia Cardiovascular Medical History: Positive for: Arrhythmia - defib and pacer inplanted; Congestive Heart Failure - combined; Coronary Artery Disease - dx'd with; Hypertension - on meds; Myocardial Infarction - 2005 , large NSTEMI 10/2015 Negative for: Angina; Deep Vein Thrombosis; Hypotension; Peripheral Arterial Disease; Peripheral Venous Disease; Phlebitis; Vasculitis Past Medical History Notes: ischemin cardiomyopathy , hyperlipidemia , possible new CVA - known CVA in 12 ; EJECTION FRACTINO -15% Gastrointestinal Medical History: Negative for: Cirrhosis ; Colitis; Crohns; Hepatitis A; Hepatitis B; Hepatitis C Endocrine Medical History: Positive for: Type II Diabetes - on insulin Negative for: Type I Diabetes Past Medical History Notes: uncontrolled -A1C on 10/2015 - 12.0 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 Time with diabetes: 12 years Treated with:  Insulin Blood sugar tested every day: Yes T ested : 3x/day Blood sugar testing results: Breakfast: insulin; Lunch: insulin; Dinner: insulin; Bedtime: insulin Genitourinary Medical History: Negative for: End Stage Renal Disease Past Medical History Notes: acute on chronic kidney disease Stage 3 , recent Klebisella UTI , Immunological Medical History: Negative for: Lupus Erythematosus; Raynauds; Scleroderma Musculoskeletal Medical History: Positive for: Gout - sporadic Negative for: Rheumatoid Arthritis; Osteoarthritis; Osteomyelitis Neurologic Medical History: Positive for: Neuropathy - bilat lower ext Negative for: Dementia; Quadriplegia; Paraplegia; Seizure Disorder Oncologic Medical History: Negative for: Received Chemotherapy; Received Radiation Psychiatric Medical History: Negative for: Anorexia/bulimia; Confinement Anxiety Immunizations Pneumococcal Vaccine: Received Pneumococcal Vaccination: No Immunization Notes: 8 years ago Implantable Devices No devices added Hospitalization / Surgery History Type of Hospitalization/Surgery kidney infection kidney infection/urinary retention Family and Social History Cancer: No; Diabetes: No;  Heart Disease: Yes - Maternal Grandparents,Paternal Grandparents,Mother,Father; Hereditary Spherocytosis: No; Hypertension: Yes - Mother,Maternal Grandparents; Kidney Disease: No; Lung Disease: Yes - Mother; Seizures: No; Stroke: No; Thyroid Problems: No; Tuberculosis: No; Never smoker; Marital Status - Married; Alcohol Use: Never; Drug Use: No History; Caffeine Use: Never; Financial Concerns: No; Food, Clothing or Shelter Needs: No; Support System Lacking: No; Transportation Concerns: No Electronic Signature(s) Signed: 08/09/2020 12:32:53 PM By: Kalman Shan DO Signed: 08/26/2020 3:14:58 PM By: [name] Entered By: Kalman Shan on 08/09/2020  12:21:48 -------------------------------------------------------------------------------- [place] Details Patient Name: Date of Service: [name] 08/09/2020 Medical Record Number: [id number] Patient Account Number: [id number] Date of Birth/Sex: Treating RN: 11/16/1945 (75 y.o. Male) [name] Primary Care Provider: [name], [name] Other Clinician: Referring Provider: Treating Provider/Extender: Matika Bartell [name], [name] Weeks in Treatment: 225 Diagnosis Coding ICD-10 Codes Code Description E11.621 Type 2 diabetes mellitus with foot ulcer L89.613 Pressure ulcer of right heel, stage 3 L97.518 Non-pressure chronic ulcer of other part of right foot with other specified severity L97.221 Non-pressure chronic ulcer of left calf limited to breakdown of skin L97.321 Non-pressure chronic ulcer of left ankle limited to breakdown of skin L97.521 Non-pressure chronic ulcer of other part of left foot limited to breakdown of skin L97.524 Non-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 Facility Procedures CPT4 Code: [id number] Description: 11042 - DEB SUBQ TISSUE 20 SQ CM/< ICD-10 Diagnosis Description L97.518 Non-pressure chronic ulcer of other part of right foot with other specified sev L97.521 Non-pressure chronic ulcer of other part of left foot limited to breakdown of s  L97.524 Non-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 Modifier: erity kin Quantity: 1 Physician Procedures : CPT4 Code Description Modifier [id number] 11042 - WC PHYS SUBQ TISS 20 SQ CM ICD-10 Diagnosis Description L97.518 Non-pressure chronic ulcer of other part of right foot with other specified severity L97.521 Non-pressure chronic ulcer of other part of left  foot limited to breakdown of skin L97.524 Non-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 Quantity: 1 Electronic Signature(s) Signed: 08/09/2020 12:32:53 PM By:  Kalman Shan DO Entered By: Kalman Shan on 08/09/2020 12:32:15  Heart Disease: Yes - Maternal Grandparents,Paternal Grandparents,Mother,Father; Hereditary Spherocytosis: No; Hypertension: Yes - Mother,Maternal Grandparents; Kidney Disease: No; Lung Disease: Yes - Mother; Seizures: No; Stroke: No; Thyroid Problems: No; Tuberculosis: No; Never smoker; Marital Status - Married; Alcohol Use: Never; Drug Use: No History; Caffeine Use: Never; Financial Concerns: No; Food, Clothing or Shelter Needs: No; Support System Lacking: No; Transportation Concerns: No Electronic Signature(s) Signed: 08/09/2020 12:32:53 PM By: Kalman Shan DO Signed: 08/26/2020 3:14:58 PM By: [name] Entered By: Kalman Shan on 08/09/2020  12:21:48 -------------------------------------------------------------------------------- [place] Details Patient Name: Date of Service: [name] 08/09/2020 Medical Record Number: [id number] Patient Account Number: [id number] Date of Birth/Sex: Treating RN: 11/16/1945 (75 y.o. Male) [name] Primary Care Provider: [name], [name] Other Clinician: Referring Provider: Treating Provider/Extender: Matika Bartell [name], [name] Weeks in Treatment: 225 Diagnosis Coding ICD-10 Codes Code Description E11.621 Type 2 diabetes mellitus with foot ulcer L89.613 Pressure ulcer of right heel, stage 3 L97.518 Non-pressure chronic ulcer of other part of right foot with other specified severity L97.221 Non-pressure chronic ulcer of left calf limited to breakdown of skin L97.321 Non-pressure chronic ulcer of left ankle limited to breakdown of skin L97.521 Non-pressure chronic ulcer of other part of left foot limited to breakdown of skin L97.524 Non-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 Facility Procedures CPT4 Code: [id number] Description: 11042 - DEB SUBQ TISSUE 20 SQ CM/< ICD-10 Diagnosis Description L97.518 Non-pressure chronic ulcer of other part of right foot with other specified sev L97.521 Non-pressure chronic ulcer of other part of left foot limited to breakdown of s  L97.524 Non-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 Modifier: erity kin Quantity: 1 Physician Procedures : CPT4 Code Description Modifier [id number] 11042 - WC PHYS SUBQ TISS 20 SQ CM ICD-10 Diagnosis Description L97.518 Non-pressure chronic ulcer of other part of right foot with other specified severity L97.521 Non-pressure chronic ulcer of other part of left  foot limited to breakdown of skin L97.524 Non-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 Quantity: 1 Electronic Signature(s) Signed: 08/09/2020 12:32:53 PM By:  Kalman Shan DO Entered By: Kalman Shan on 08/09/2020 12:32:15  and water prior to dressing change. Peri-Wound Care: Triamcinolone 15 (g) 1 x Per Week/15 Days Discharge Instructions: Use triamcinolone 15 (g) as directed Peri-Wound Care: Sween Lotion (Moisturizing lotion) 1 x Per Week/15 Days Discharge Instructions: Apply moisturizing lotion as directed Prim Dressing: KerraCel Ag Gelling Fiber Dressing, 4x5 in (silver alginate) (Generic) 1 x Per Week/15 Days ary Discharge Instructions: Apply silver alginate to wound bed as instructed Secondary Dressing: Woven Gauze Sponge, Non-Sterile 4x4 in (Generic) 1 x Per Week/15 Days Discharge Instructions: Apply over primary dressing as directed. Secondary Dressing: ABD Pad, 5x9 (Generic) 1 x Per Week/15 Days Discharge Instructions: Apply over primary dressing as directed. Com pression Wrap: Kerlix Roll 4.5x3.1 (in/yd) (Generic) 1 x Per Week/15 Days Discharge Instructions: Apply Kerlix and Coban compression as directed. Com pression Wrap: Coban Self-Adherent Wrap 4x5 (in/yd) (Generic) 1 x Per Week/15 Days Discharge Instructions: Apply over Kerlix as directed. 1. Kerlix/Coban with silver alginate 2. Follow-up in 2 weeks with Dr. Dellia Nims 3. In office sharp debridement Electronic Signature(s) Signed: 08/09/2020 12:32:53 PM By: Kalman Shan DO Entered By: Kalman Shan on 08/09/2020 12:25:31 -------------------------------------------------------------------------------- HxROS Details Patient Name: Date of Service: [name] 08/09/2020 9:30 A M Medical Record  Number: [id number] Patient Account Number: [id number] Date of Birth/Sex: Treating RN: 12-09-1945 (75 y.o. Male) [name] Primary Care Provider: [name], [name] Other Clinician: Referring Provider: Treating Provider/Extender: Hadlie Gipson [name], [name] Weeks in Treatment: 225 Information Obtained From Patient Constitutional Symptoms (General Health) Medical History: Past Medical History Notes: acute encephalopathy Eyes Medical History: Negative for: Cataracts; Glaucoma; Optic Neuritis Ear/Nose/Mouth/Throat Medical History: Negative for: Chronic sinus problems/congestion; Middle ear problems Hematologic/Lymphatic Medical History: Positive for: Anemia Negative for: Hemophilia; Human Immunodeficiency Virus; Lymphedema; Sickle Cell Disease Respiratory Medical History: Positive for: Sleep Apnea - unable to tolerate devices Negative for: Aspiration; Asthma; Chronic Obstructive Pulmonary Disease (COPD); Pneumothorax; Tuberculosis Past Medical History Notes: acute hypoxic resp. failure due to aspiration pneumonia Cardiovascular Medical History: Positive for: Arrhythmia - defib and pacer inplanted; Congestive Heart Failure - combined; Coronary Artery Disease - dx'd with; Hypertension - on meds; Myocardial Infarction - 2005 , large NSTEMI 10/2015 Negative for: Angina; Deep Vein Thrombosis; Hypotension; Peripheral Arterial Disease; Peripheral Venous Disease; Phlebitis; Vasculitis Past Medical History Notes: ischemin cardiomyopathy , hyperlipidemia , possible new CVA - known CVA in 12 ; EJECTION FRACTINO -15% Gastrointestinal Medical History: Negative for: Cirrhosis ; Colitis; Crohns; Hepatitis A; Hepatitis B; Hepatitis C Endocrine Medical History: Positive for: Type II Diabetes - on insulin Negative for: Type I Diabetes Past Medical History Notes: uncontrolled -A1C on 10/2015 - 12.0 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 Time with diabetes: 12 years Treated with:  Insulin Blood sugar tested every day: Yes T ested : 3x/day Blood sugar testing results: Breakfast: insulin; Lunch: insulin; Dinner: insulin; Bedtime: insulin Genitourinary Medical History: Negative for: End Stage Renal Disease Past Medical History Notes: acute on chronic kidney disease Stage 3 , recent Klebisella UTI , Immunological Medical History: Negative for: Lupus Erythematosus; Raynauds; Scleroderma Musculoskeletal Medical History: Positive for: Gout - sporadic Negative for: Rheumatoid Arthritis; Osteoarthritis; Osteomyelitis Neurologic Medical History: Positive for: Neuropathy - bilat lower ext Negative for: Dementia; Quadriplegia; Paraplegia; Seizure Disorder Oncologic Medical History: Negative for: Received Chemotherapy; Received Radiation Psychiatric Medical History: Negative for: Anorexia/bulimia; Confinement Anxiety Immunizations Pneumococcal Vaccine: Received Pneumococcal Vaccination: No Immunization Notes: 8 years ago Implantable Devices No devices added Hospitalization / Surgery History Type of Hospitalization/Surgery kidney infection kidney infection/urinary retention Family and Social History Cancer: No; Diabetes: No;  and water prior to dressing change. Peri-Wound Care: Triamcinolone 15 (g) 1 x Per Week/15 Days Discharge Instructions: Use triamcinolone 15 (g) as directed Peri-Wound Care: Sween Lotion (Moisturizing lotion) 1 x Per Week/15 Days Discharge Instructions: Apply moisturizing lotion as directed Prim Dressing: KerraCel Ag Gelling Fiber Dressing, 4x5 in (silver alginate) (Generic) 1 x Per Week/15 Days ary Discharge Instructions: Apply silver alginate to wound bed as instructed Secondary Dressing: Woven Gauze Sponge, Non-Sterile 4x4 in (Generic) 1 x Per Week/15 Days Discharge Instructions: Apply over primary dressing as directed. Secondary Dressing: ABD Pad, 5x9 (Generic) 1 x Per Week/15 Days Discharge Instructions: Apply over primary dressing as directed. Com pression Wrap: Kerlix Roll 4.5x3.1 (in/yd) (Generic) 1 x Per Week/15 Days Discharge Instructions: Apply Kerlix and Coban compression as directed. Com pression Wrap: Coban Self-Adherent Wrap 4x5 (in/yd) (Generic) 1 x Per Week/15 Days Discharge Instructions: Apply over Kerlix as directed. 1. Kerlix/Coban with silver alginate 2. Follow-up in 2 weeks with Dr. Dellia Nims 3. In office sharp debridement Electronic Signature(s) Signed: 08/09/2020 12:32:53 PM By: Kalman Shan DO Entered By: Kalman Shan on 08/09/2020 12:25:31 -------------------------------------------------------------------------------- HxROS Details Patient Name: Date of Service: [name] 08/09/2020 9:30 A M Medical Record  Number: [id number] Patient Account Number: [id number] Date of Birth/Sex: Treating RN: 12-09-1945 (75 y.o. Male) [name] Primary Care Provider: [name], [name] Other Clinician: Referring Provider: Treating Provider/Extender: Hadlie Gipson [name], [name] Weeks in Treatment: 225 Information Obtained From Patient Constitutional Symptoms (General Health) Medical History: Past Medical History Notes: acute encephalopathy Eyes Medical History: Negative for: Cataracts; Glaucoma; Optic Neuritis Ear/Nose/Mouth/Throat Medical History: Negative for: Chronic sinus problems/congestion; Middle ear problems Hematologic/Lymphatic Medical History: Positive for: Anemia Negative for: Hemophilia; Human Immunodeficiency Virus; Lymphedema; Sickle Cell Disease Respiratory Medical History: Positive for: Sleep Apnea - unable to tolerate devices Negative for: Aspiration; Asthma; Chronic Obstructive Pulmonary Disease (COPD); Pneumothorax; Tuberculosis Past Medical History Notes: acute hypoxic resp. failure due to aspiration pneumonia Cardiovascular Medical History: Positive for: Arrhythmia - defib and pacer inplanted; Congestive Heart Failure - combined; Coronary Artery Disease - dx'd with; Hypertension - on meds; Myocardial Infarction - 2005 , large NSTEMI 10/2015 Negative for: Angina; Deep Vein Thrombosis; Hypotension; Peripheral Arterial Disease; Peripheral Venous Disease; Phlebitis; Vasculitis Past Medical History Notes: ischemin cardiomyopathy , hyperlipidemia , possible new CVA - known CVA in 12 ; EJECTION FRACTINO -15% Gastrointestinal Medical History: Negative for: Cirrhosis ; Colitis; Crohns; Hepatitis A; Hepatitis B; Hepatitis C Endocrine Medical History: Positive for: Type II Diabetes - on insulin Negative for: Type I Diabetes Past Medical History Notes: uncontrolled -A1C on 10/2015 - 12.0 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 Time with diabetes: 12 years Treated with:  Insulin Blood sugar tested every day: Yes T ested : 3x/day Blood sugar testing results: Breakfast: insulin; Lunch: insulin; Dinner: insulin; Bedtime: insulin Genitourinary Medical History: Negative for: End Stage Renal Disease Past Medical History Notes: acute on chronic kidney disease Stage 3 , recent Klebisella UTI , Immunological Medical History: Negative for: Lupus Erythematosus; Raynauds; Scleroderma Musculoskeletal Medical History: Positive for: Gout - sporadic Negative for: Rheumatoid Arthritis; Osteoarthritis; Osteomyelitis Neurologic Medical History: Positive for: Neuropathy - bilat lower ext Negative for: Dementia; Quadriplegia; Paraplegia; Seizure Disorder Oncologic Medical History: Negative for: Received Chemotherapy; Received Radiation Psychiatric Medical History: Negative for: Anorexia/bulimia; Confinement Anxiety Immunizations Pneumococcal Vaccine: Received Pneumococcal Vaccination: No Immunization Notes: 8 years ago Implantable Devices No devices added Hospitalization / Surgery History Type of Hospitalization/Surgery kidney infection kidney infection/urinary retention Family and Social History Cancer: No; Diabetes: No;  and water prior to dressing change. Peri-Wound Care: Triamcinolone 15 (g) 1 x Per Week/15 Days Discharge Instructions: Use triamcinolone 15 (g) as directed Peri-Wound Care: Sween Lotion (Moisturizing lotion) 1 x Per Week/15 Days Discharge Instructions: Apply moisturizing lotion as directed Prim Dressing: KerraCel Ag Gelling Fiber Dressing, 4x5 in (silver alginate) (Generic) 1 x Per Week/15 Days ary Discharge Instructions: Apply silver alginate to wound bed as instructed Secondary Dressing: Woven Gauze Sponge, Non-Sterile 4x4 in (Generic) 1 x Per Week/15 Days Discharge Instructions: Apply over primary dressing as directed. Secondary Dressing: ABD Pad, 5x9 (Generic) 1 x Per Week/15 Days Discharge Instructions: Apply over primary dressing as directed. Com pression Wrap: Kerlix Roll 4.5x3.1 (in/yd) (Generic) 1 x Per Week/15 Days Discharge Instructions: Apply Kerlix and Coban compression as directed. Com pression Wrap: Coban Self-Adherent Wrap 4x5 (in/yd) (Generic) 1 x Per Week/15 Days Discharge Instructions: Apply over Kerlix as directed. 1. Kerlix/Coban with silver alginate 2. Follow-up in 2 weeks with Dr. Dellia Nims 3. In office sharp debridement Electronic Signature(s) Signed: 08/09/2020 12:32:53 PM By: Kalman Shan DO Entered By: Kalman Shan on 08/09/2020 12:25:31 -------------------------------------------------------------------------------- HxROS Details Patient Name: Date of Service: [name] 08/09/2020 9:30 A M Medical Record  Number: [id number] Patient Account Number: [id number] Date of Birth/Sex: Treating RN: 12-09-1945 (75 y.o. Male) [name] Primary Care Provider: [name], [name] Other Clinician: Referring Provider: Treating Provider/Extender: Hadlie Gipson [name], [name] Weeks in Treatment: 225 Information Obtained From Patient Constitutional Symptoms (General Health) Medical History: Past Medical History Notes: acute encephalopathy Eyes Medical History: Negative for: Cataracts; Glaucoma; Optic Neuritis Ear/Nose/Mouth/Throat Medical History: Negative for: Chronic sinus problems/congestion; Middle ear problems Hematologic/Lymphatic Medical History: Positive for: Anemia Negative for: Hemophilia; Human Immunodeficiency Virus; Lymphedema; Sickle Cell Disease Respiratory Medical History: Positive for: Sleep Apnea - unable to tolerate devices Negative for: Aspiration; Asthma; Chronic Obstructive Pulmonary Disease (COPD); Pneumothorax; Tuberculosis Past Medical History Notes: acute hypoxic resp. failure due to aspiration pneumonia Cardiovascular Medical History: Positive for: Arrhythmia - defib and pacer inplanted; Congestive Heart Failure - combined; Coronary Artery Disease - dx'd with; Hypertension - on meds; Myocardial Infarction - 2005 , large NSTEMI 10/2015 Negative for: Angina; Deep Vein Thrombosis; Hypotension; Peripheral Arterial Disease; Peripheral Venous Disease; Phlebitis; Vasculitis Past Medical History Notes: ischemin cardiomyopathy , hyperlipidemia , possible new CVA - known CVA in 12 ; EJECTION FRACTINO -15% Gastrointestinal Medical History: Negative for: Cirrhosis ; Colitis; Crohns; Hepatitis A; Hepatitis B; Hepatitis C Endocrine Medical History: Positive for: Type II Diabetes - on insulin Negative for: Type I Diabetes Past Medical History Notes: uncontrolled -A1C on 10/2015 - 12.0 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 Time with diabetes: 12 years Treated with:  Insulin Blood sugar tested every day: Yes T ested : 3x/day Blood sugar testing results: Breakfast: insulin; Lunch: insulin; Dinner: insulin; Bedtime: insulin Genitourinary Medical History: Negative for: End Stage Renal Disease Past Medical History Notes: acute on chronic kidney disease Stage 3 , recent Klebisella UTI , Immunological Medical History: Negative for: Lupus Erythematosus; Raynauds; Scleroderma Musculoskeletal Medical History: Positive for: Gout - sporadic Negative for: Rheumatoid Arthritis; Osteoarthritis; Osteomyelitis Neurologic Medical History: Positive for: Neuropathy - bilat lower ext Negative for: Dementia; Quadriplegia; Paraplegia; Seizure Disorder Oncologic Medical History: Negative for: Received Chemotherapy; Received Radiation Psychiatric Medical History: Negative for: Anorexia/bulimia; Confinement Anxiety Immunizations Pneumococcal Vaccine: Received Pneumococcal Vaccination: No Immunization Notes: 8 years ago Implantable Devices No devices added Hospitalization / Surgery History Type of Hospitalization/Surgery kidney infection kidney infection/urinary retention Family and Social History Cancer: No; Diabetes: No;  and feet possibly contributing to the difficulty healing these areas. I will need to look over all of this. He is a diabetic with polyneuropathy but I am not sure whether this is the issue here. He claims to have good sensation in his foot and complains of no pain 4/5; the patient comes in today with the erythema on the dorsal foot on the left slightly better. Culture I did last time showed moderate strep group B. Fortunately had already put him on cephalexin and the erythema seems to be better. However he comes in today with a new wound on the left lateral fifth met head completely necrotic surface. This is not dissimilar to the other wounds he seems to be developing. He does not have obviously correctable macrovascular problem. I wonder whether he has microvascular disease or perhaps some other form of ischemic challenge such as micro emboli or vasculitis etc. 4/19; Patient presents for 2-week follow-up. He has no complaints today. He finished his course of antibiotics scheduled at last visit for a culture that grew moderate strep group B. He has no new wounds today. His daughter continues to do his dressings Electronic Signature(s) Signed: 08/09/2020 12:32:53 PM By: Kalman Shan DO Entered By: Kalman Shan on 08/09/2020 12:16:33 -------------------------------------------------------------------------------- Physical Exam Details Patient Name: Date of Service: [name] 08/09/2020 9:30 A M Medical Record Number: [id number] Patient Account Number: [id number] Date of Birth/Sex: Treating RN: Mar 07, 1946 (75 y.o. Male) [name] Primary Care Provider: [name], [name] Other Clinician: Referring Provider: Treating Provider/Extender: Eddy Termine [name], [name] Weeks in Treatment: 225 Notes Right lower extremity: He has wounds to the lateral lower leg, dorsal foot, medial dorsal foot. Areas with necrotic tissue were debrided and these included the right medial dorsal foot. No signs of infection Present. Left lower extremity: He has wounds to the lateral lower leg, dorsal foot, distal foot, lateral foot, dorsal lateral foot. The wounds with necrotic tissue were debrided and these included the dorsal, lateral , and distal foot. The dorsal foot wound goes to bone. The lateral left foot has tendon exposed. No signs of infection. Electronic Signature(s) Signed: 08/09/2020 12:32:53 PM By: Kalman Shan DO Entered By: Kalman Shan on 08/09/2020 12:22:48 -------------------------------------------------------------------------------- Physician Orders Details Patient Name: Date of Service: [name] 08/09/2020 9:30 A M Medical Record Number: [id number] Patient Account Number: [id number] Date of Birth/Sex: Treating RN: 04/28/1945 (75 y.o. Male) Levan Hurst Primary Care Provider: [name], [name] Other Clinician: Referring Provider: Treating Provider/Extender: Avila Albritton [name], [name] Weeks in Treatment: 225 Verbal / Phone Orders: No Diagnosis Coding ICD-10 Coding Code Description E11.621 Type 2 diabetes mellitus with foot ulcer L97.518 Non-pressure chronic ulcer of other part of right foot with other specified severity L97.221 Non-pressure chronic  ulcer of left calf limited to breakdown of skin L97.321 Non-pressure chronic ulcer of left ankle limited to breakdown of skin L97.521 Non-pressure chronic ulcer of other part of left foot limited to breakdown of skin L97.524 Non-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 Follow-up Appointments ppointment in 2 weeks. - Dr. Dellia Nims Return A Bathing/ Shower/ Hygiene May shower with protection but do not get wound dressing(s) wet. Edema Control - Lymphedema / SCD / Other Elevate legs to the level of the heart or above for 30 minutes daily and/or when sitting, a frequency of: Avoid standing for long periods of time. Off-Loading Other: - multipodus boots to both feet Wound Treatment Wound #14 - Lower Leg Wound Laterality: Left,  Heart Disease: Yes - Maternal Grandparents,Paternal Grandparents,Mother,Father; Hereditary Spherocytosis: No; Hypertension: Yes - Mother,Maternal Grandparents; Kidney Disease: No; Lung Disease: Yes - Mother; Seizures: No; Stroke: No; Thyroid Problems: No; Tuberculosis: No; Never smoker; Marital Status - Married; Alcohol Use: Never; Drug Use: No History; Caffeine Use: Never; Financial Concerns: No; Food, Clothing or Shelter Needs: No; Support System Lacking: No; Transportation Concerns: No Electronic Signature(s) Signed: 08/09/2020 12:32:53 PM By: Kalman Shan DO Signed: 08/26/2020 3:14:58 PM By: [name] Entered By: Kalman Shan on 08/09/2020  12:21:48 -------------------------------------------------------------------------------- [place] Details Patient Name: Date of Service: [name] 08/09/2020 Medical Record Number: [id number] Patient Account Number: [id number] Date of Birth/Sex: Treating RN: 11/16/1945 (75 y.o. Male) [name] Primary Care Provider: [name], [name] Other Clinician: Referring Provider: Treating Provider/Extender: Matika Bartell [name], [name] Weeks in Treatment: 225 Diagnosis Coding ICD-10 Codes Code Description E11.621 Type 2 diabetes mellitus with foot ulcer L89.613 Pressure ulcer of right heel, stage 3 L97.518 Non-pressure chronic ulcer of other part of right foot with other specified severity L97.221 Non-pressure chronic ulcer of left calf limited to breakdown of skin L97.321 Non-pressure chronic ulcer of left ankle limited to breakdown of skin L97.521 Non-pressure chronic ulcer of other part of left foot limited to breakdown of skin L97.524 Non-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 Facility Procedures CPT4 Code: [id number] Description: 11042 - DEB SUBQ TISSUE 20 SQ CM/< ICD-10 Diagnosis Description L97.518 Non-pressure chronic ulcer of other part of right foot with other specified sev L97.521 Non-pressure chronic ulcer of other part of left foot limited to breakdown of s  L97.524 Non-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 Modifier: erity kin Quantity: 1 Physician Procedures : CPT4 Code Description Modifier [id number] 11042 - WC PHYS SUBQ TISS 20 SQ CM ICD-10 Diagnosis Description L97.518 Non-pressure chronic ulcer of other part of right foot with other specified severity L97.521 Non-pressure chronic ulcer of other part of left  foot limited to breakdown of skin L97.524 Non-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 Quantity: 1 Electronic Signature(s) Signed: 08/09/2020 12:32:53 PM By:  Kalman Shan DO Entered By: Kalman Shan on 08/09/2020 12:32:15  Heart Disease: Yes - Maternal Grandparents,Paternal Grandparents,Mother,Father; Hereditary Spherocytosis: No; Hypertension: Yes - Mother,Maternal Grandparents; Kidney Disease: No; Lung Disease: Yes - Mother; Seizures: No; Stroke: No; Thyroid Problems: No; Tuberculosis: No; Never smoker; Marital Status - Married; Alcohol Use: Never; Drug Use: No History; Caffeine Use: Never; Financial Concerns: No; Food, Clothing or Shelter Needs: No; Support System Lacking: No; Transportation Concerns: No Electronic Signature(s) Signed: 08/09/2020 12:32:53 PM By: Kalman Shan DO Signed: 08/26/2020 3:14:58 PM By: [name] Entered By: Kalman Shan on 08/09/2020  12:21:48 -------------------------------------------------------------------------------- [place] Details Patient Name: Date of Service: [name] 08/09/2020 Medical Record Number: [id number] Patient Account Number: [id number] Date of Birth/Sex: Treating RN: 11/16/1945 (75 y.o. Male) [name] Primary Care Provider: [name], [name] Other Clinician: Referring Provider: Treating Provider/Extender: Matika Bartell [name], [name] Weeks in Treatment: 225 Diagnosis Coding ICD-10 Codes Code Description E11.621 Type 2 diabetes mellitus with foot ulcer L89.613 Pressure ulcer of right heel, stage 3 L97.518 Non-pressure chronic ulcer of other part of right foot with other specified severity L97.221 Non-pressure chronic ulcer of left calf limited to breakdown of skin L97.321 Non-pressure chronic ulcer of left ankle limited to breakdown of skin L97.521 Non-pressure chronic ulcer of other part of left foot limited to breakdown of skin L97.524 Non-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 Facility Procedures CPT4 Code: [id number] Description: 11042 - DEB SUBQ TISSUE 20 SQ CM/< ICD-10 Diagnosis Description L97.518 Non-pressure chronic ulcer of other part of right foot with other specified sev L97.521 Non-pressure chronic ulcer of other part of left foot limited to breakdown of s  L97.524 Non-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 Modifier: erity kin Quantity: 1 Physician Procedures : CPT4 Code Description Modifier [id number] 11042 - WC PHYS SUBQ TISS 20 SQ CM ICD-10 Diagnosis Description L97.518 Non-pressure chronic ulcer of other part of right foot with other specified severity L97.521 Non-pressure chronic ulcer of other part of left  foot limited to breakdown of skin L97.524 Non-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 Quantity: 1 Electronic Signature(s) Signed: 08/09/2020 12:32:53 PM By:  Kalman Shan DO Entered By: Kalman Shan on 08/09/2020 12:32:15  of bone 06/14/2020 No Yes Inactive Problems ICD-10 Code Description Active Date Inactive Date L89.613 Pressure ulcer of right heel, stage 3 04/17/2016 04/17/2016 L03.032 Cellulitis of left toe 01/06/2019 01/06/2019 G29.528 Pressure ulcer of left heel, stage 3 12/04/2016 12/04/2016 I25.119 Atherosclerotic heart disease of native coronary artery with unspecified angina 04/17/2016 04/17/2016 pectoris L97.821 Non-pressure chronic ulcer of other part of left lower leg limited to breakdown of skin 01/06/2019 01/06/2019 S51.811D Laceration without foreign body of right forearm, subsequent encounter 10/22/2017 10/22/2017 U13.24 Acute diastolic (congestive) heart failure 04/17/2016 04/17/2016 L03.116 Cellulitis of left lower limb 12/24/2017 12/24/2017 L89.620 Pressure ulcer of left heel, unstageable 07/21/2019 07/21/2019 L97.211 Non-pressure chronic ulcer of right calf limited to breakdown of skin 07/21/2019 07/21/2019 S80.211D Abrasion, right knee, subsequent encounter 08/18/2019 08/18/2019 Resolved Problems ICD-10 Code Description Active Date Resolved Date L89.512 Pressure ulcer of right ankle, stage 2 04/17/2016 04/17/2016 L89.522 Pressure ulcer of left ankle, stage 2 04/17/2016 04/17/2016 Electronic Signature(s) Signed: 08/09/2020 12:32:53 PM By: Kalman Shan DO Entered By: Kalman Shan on 08/09/2020 12:07:41 -------------------------------------------------------------------------------- Progress Note Details Patient Name: Date of Service: [name] 08/09/2020 9:30 A M Medical Record Number: [id number] Patient Account Number: [id number] Date of Birth/Sex: Treating RN: 08/30/1945 (75 y.o. Male) [name] Primary Care Provider: [name], [name] Other  Clinician: Referring Provider: Treating Provider/Extender: Malonie Tatum [name], [name] Weeks in Treatment: 225 Subjective Chief Complaint Information obtained from Patient He is here in follow up evaluation for bilateral heel ulcers History of Present Illness (HPI) The following HPI elements were documented for the patient's wound: Location: On the left and right lateral forefoot which has been there for about 6 months Quality: Patient reports No Pain. Severity: Patient states wound(s) are getting worse. Duration: Patient has had the wound for > 6 months prior to seeking treatment at the wound center Context: The wound would happen gradually Modifying Factors: Patient wound(s)/ulcer(s) are worsening due to :continual drainage from the wound Associated Signs and Symptoms: Patient reports having increase discharge. This patient returns after being seen here till the end of August and he was lost to follow-up. he has been quite debilitated laying in bed most of the time and his condition has deteriorated significantly. He has multiple ulcerations on the heel lateral forefoot and some of his toes. ======== Old notes: 75 year old male known to our practice when he was seen here in February and March and was lost to follow-up when he was admitted to hospital with various medical problems including coronary artery disease and a stroke. Now returns with the problem on the left forefoot where he has an ulceration and this has been there for about 6 months. 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 He has had persistent hyperglycemia given recently. Patient has a past medical history of diabetes mellitus, hypertension, combined systolic and diastolic heart failure, peripheral neuropathy, gout, cardiomyopathy with ejection fraction of about 10-15%, coronary artery disease, recent ventricular fibrillation, chronic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 Gelling Fiber Dressing, 4x5 in (silver alginate) (Generic) 1 x Per Week/15 Days ary Discharge Instructions: Apply silver alginate to wound bed as instructed Secondary Dressing: Woven Gauze Sponge, Non-Sterile 4x4 in (Generic) 1 x Per Week/15 Days Discharge Instructions: Apply over primary dressing as directed. Secondary Dressing: ABD Pad, 5x9 (Generic) 1 x Per Week/15 Days Discharge Instructions: Apply over primary dressing as directed. Com pression Wrap: Kerlix Roll 4.5x3.1 (in/yd) (Generic) 1 x Per Week/15 Days Discharge Instructions: Apply Kerlix and Coban compression as directed. Com pression Wrap: Coban Self-Adherent Wrap 4x5 (in/yd) (Generic) 1 x Per Week/15 Days Discharge Instructions: Apply over Kerlix as directed. 1. Kerlix/Coban with silver alginate 2. Follow-up in 2 weeks with Dr. Dellia Nims 3. In office sharp debridement Electronic Signature(s) Signed: 08/09/2020 12:32:53 PM By: Kalman Shan DO Entered By: Kalman Shan on 08/09/2020 12:25:31 -------------------------------------------------------------------------------- HxROS Details Patient Name: Date of Service: [name] 08/09/2020 9:30 A M Medical Record  Number: [id number] Patient Account Number: [id number] Date of Birth/Sex: Treating RN: 12-09-1945 (75 y.o. Male) [name] Primary Care Provider: [name], [name] Other Clinician: Referring Provider: Treating Provider/Extender: Hadlie Gipson [name], [name] Weeks in Treatment: 225 Information Obtained From Patient Constitutional Symptoms (General Health) Medical History: Past Medical History Notes: acute encephalopathy Eyes Medical History: Negative for: Cataracts; Glaucoma; Optic Neuritis Ear/Nose/Mouth/Throat Medical History: Negative for: Chronic sinus problems/congestion; Middle ear problems Hematologic/Lymphatic Medical History: Positive for: Anemia Negative for: Hemophilia; Human Immunodeficiency Virus; Lymphedema; Sickle Cell Disease Respiratory Medical History: Positive for: Sleep Apnea - unable to tolerate devices Negative for: Aspiration; Asthma; Chronic Obstructive Pulmonary Disease (COPD); Pneumothorax; Tuberculosis Past Medical History Notes: acute hypoxic resp. failure due to aspiration pneumonia Cardiovascular Medical History: Positive for: Arrhythmia - defib and pacer inplanted; Congestive Heart Failure - combined; Coronary Artery Disease - dx'd with; Hypertension - on meds; Myocardial Infarction - 2005 , large NSTEMI 10/2015 Negative for: Angina; Deep Vein Thrombosis; Hypotension; Peripheral Arterial Disease; Peripheral Venous Disease; Phlebitis; Vasculitis Past Medical History Notes: ischemin cardiomyopathy , hyperlipidemia , possible new CVA - known CVA in 12 ; EJECTION FRACTINO -15% Gastrointestinal Medical History: Negative for: Cirrhosis ; Colitis; Crohns; Hepatitis A; Hepatitis B; Hepatitis C Endocrine Medical History: Positive for: Type II Diabetes - on insulin Negative for: Type I Diabetes Past Medical History Notes: uncontrolled -A1C on 10/2015 - 12.0 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 Time with diabetes: 12 years Treated with:  Insulin Blood sugar tested every day: Yes T ested : 3x/day Blood sugar testing results: Breakfast: insulin; Lunch: insulin; Dinner: insulin; Bedtime: insulin Genitourinary Medical History: Negative for: End Stage Renal Disease Past Medical History Notes: acute on chronic kidney disease Stage 3 , recent Klebisella UTI , Immunological Medical History: Negative for: Lupus Erythematosus; Raynauds; Scleroderma Musculoskeletal Medical History: Positive for: Gout - sporadic Negative for: Rheumatoid Arthritis; Osteoarthritis; Osteomyelitis Neurologic Medical History: Positive for: Neuropathy - bilat lower ext Negative for: Dementia; Quadriplegia; Paraplegia; Seizure Disorder Oncologic Medical History: Negative for: Received Chemotherapy; Received Radiation Psychiatric Medical History: Negative for: Anorexia/bulimia; Confinement Anxiety Immunizations Pneumococcal Vaccine: Received Pneumococcal Vaccination: No Immunization Notes: 8 years ago Implantable Devices No devices added Hospitalization / Surgery History Type of Hospitalization/Surgery kidney infection kidney infection/urinary retention Family and Social History Cancer: No; Diabetes: No;  and feet possibly contributing to the difficulty healing these areas. I will need to look over all of this. He is a diabetic with polyneuropathy but I am not sure whether this is the issue here. He claims to have good sensation in his foot and complains of no pain 4/5; the patient comes in today with the erythema on the dorsal foot on the left slightly better. Culture I did last time showed moderate strep group B. Fortunately had already put him on cephalexin and the erythema seems to be better. However he comes in today with a new wound on the left lateral fifth met head completely necrotic surface. This is not dissimilar to the other wounds he seems to be developing. He does not have obviously correctable macrovascular problem. I wonder whether he has microvascular disease or perhaps some other form of ischemic challenge such as micro emboli or vasculitis etc. 4/19; Patient presents for 2-week follow-up. He has no complaints today. He finished his course of antibiotics scheduled at last visit for a culture that grew moderate strep group B. He has no new wounds today. His daughter continues to do his dressings Electronic Signature(s) Signed: 08/09/2020 12:32:53 PM By: Kalman Shan DO Entered By: Kalman Shan on 08/09/2020 12:16:33 -------------------------------------------------------------------------------- Physical Exam Details Patient Name: Date of Service: [name] 08/09/2020 9:30 A M Medical Record Number: [id number] Patient Account Number: [id number] Date of Birth/Sex: Treating RN: Mar 07, 1946 (75 y.o. Male) [name] Primary Care Provider: [name], [name] Other Clinician: Referring Provider: Treating Provider/Extender: Eddy Termine [name], [name] Weeks in Treatment: 225 Notes Right lower extremity: He has wounds to the lateral lower leg, dorsal foot, medial dorsal foot. Areas with necrotic tissue were debrided and these included the right medial dorsal foot. No signs of infection Present. Left lower extremity: He has wounds to the lateral lower leg, dorsal foot, distal foot, lateral foot, dorsal lateral foot. The wounds with necrotic tissue were debrided and these included the dorsal, lateral , and distal foot. The dorsal foot wound goes to bone. The lateral left foot has tendon exposed. No signs of infection. Electronic Signature(s) Signed: 08/09/2020 12:32:53 PM By: Kalman Shan DO Entered By: Kalman Shan on 08/09/2020 12:22:48 -------------------------------------------------------------------------------- Physician Orders Details Patient Name: Date of Service: [name] 08/09/2020 9:30 A M Medical Record Number: [id number] Patient Account Number: [id number] Date of Birth/Sex: Treating RN: 04/28/1945 (75 y.o. Male) Levan Hurst Primary Care Provider: [name], [name] Other Clinician: Referring Provider: Treating Provider/Extender: Avila Albritton [name], [name] Weeks in Treatment: 225 Verbal / Phone Orders: No Diagnosis Coding ICD-10 Coding Code Description E11.621 Type 2 diabetes mellitus with foot ulcer L97.518 Non-pressure chronic ulcer of other part of right foot with other specified severity L97.221 Non-pressure chronic  ulcer of left calf limited to breakdown of skin L97.321 Non-pressure chronic ulcer of left ankle limited to breakdown of skin L97.521 Non-pressure chronic ulcer of other part of left foot limited to breakdown of skin L97.524 Non-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 Follow-up Appointments ppointment in 2 weeks. - Dr. Dellia Nims Return A Bathing/ Shower/ Hygiene May shower with protection but do not get wound dressing(s) wet. Edema Control - Lymphedema / SCD / Other Elevate legs to the level of the heart or above for 30 minutes daily and/or when sitting, a frequency of: Avoid standing for long periods of time. Off-Loading Other: - multipodus boots to both feet Wound Treatment Wound #14 - Lower Leg Wound Laterality: Left,  and water prior to dressing change. Peri-Wound Care: Triamcinolone 15 (g) 1 x Per Week/15 Days Discharge Instructions: Use triamcinolone 15 (g) as directed Peri-Wound Care: Sween Lotion (Moisturizing lotion) 1 x Per Week/15 Days Discharge Instructions: Apply moisturizing lotion as directed Prim Dressing: KerraCel Ag Gelling Fiber Dressing, 4x5 in (silver alginate) (Generic) 1 x Per Week/15 Days ary Discharge Instructions: Apply silver alginate to wound bed as instructed Secondary Dressing: Woven Gauze Sponge, Non-Sterile 4x4 in (Generic) 1 x Per Week/15 Days Discharge Instructions: Apply over primary dressing as directed. Secondary Dressing: ABD Pad, 5x9 (Generic) 1 x Per Week/15 Days Discharge Instructions: Apply over primary dressing as directed. Com pression Wrap: Kerlix Roll 4.5x3.1 (in/yd) (Generic) 1 x Per Week/15 Days Discharge Instructions: Apply Kerlix and Coban compression as directed. Com pression Wrap: Coban Self-Adherent Wrap 4x5 (in/yd) (Generic) 1 x Per Week/15 Days Discharge Instructions: Apply over Kerlix as directed. 1. Kerlix/Coban with silver alginate 2. Follow-up in 2 weeks with Dr. Dellia Nims 3. In office sharp debridement Electronic Signature(s) Signed: 08/09/2020 12:32:53 PM By: Kalman Shan DO Entered By: Kalman Shan on 08/09/2020 12:25:31 -------------------------------------------------------------------------------- HxROS Details Patient Name: Date of Service: [name] 08/09/2020 9:30 A M Medical Record  Number: [id number] Patient Account Number: [id number] Date of Birth/Sex: Treating RN: 12-09-1945 (75 y.o. Male) [name] Primary Care Provider: [name], [name] Other Clinician: Referring Provider: Treating Provider/Extender: Hadlie Gipson [name], [name] Weeks in Treatment: 225 Information Obtained From Patient Constitutional Symptoms (General Health) Medical History: Past Medical History Notes: acute encephalopathy Eyes Medical History: Negative for: Cataracts; Glaucoma; Optic Neuritis Ear/Nose/Mouth/Throat Medical History: Negative for: Chronic sinus problems/congestion; Middle ear problems Hematologic/Lymphatic Medical History: Positive for: Anemia Negative for: Hemophilia; Human Immunodeficiency Virus; Lymphedema; Sickle Cell Disease Respiratory Medical History: Positive for: Sleep Apnea - unable to tolerate devices Negative for: Aspiration; Asthma; Chronic Obstructive Pulmonary Disease (COPD); Pneumothorax; Tuberculosis Past Medical History Notes: acute hypoxic resp. failure due to aspiration pneumonia Cardiovascular Medical History: Positive for: Arrhythmia - defib and pacer inplanted; Congestive Heart Failure - combined; Coronary Artery Disease - dx'd with; Hypertension - on meds; Myocardial Infarction - 2005 , large NSTEMI 10/2015 Negative for: Angina; Deep Vein Thrombosis; Hypotension; Peripheral Arterial Disease; Peripheral Venous Disease; Phlebitis; Vasculitis Past Medical History Notes: ischemin cardiomyopathy , hyperlipidemia , possible new CVA - known CVA in 12 ; EJECTION FRACTINO -15% Gastrointestinal Medical History: Negative for: Cirrhosis ; Colitis; Crohns; Hepatitis A; Hepatitis B; Hepatitis C Endocrine Medical History: Positive for: Type II Diabetes - on insulin Negative for: Type I Diabetes Past Medical History Notes: uncontrolled -A1C on 10/2015 - 12.0 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 Time with diabetes: 12 years Treated with:  Insulin Blood sugar tested every day: Yes T ested : 3x/day Blood sugar testing results: Breakfast: insulin; Lunch: insulin; Dinner: insulin; Bedtime: insulin Genitourinary Medical History: Negative for: End Stage Renal Disease Past Medical History Notes: acute on chronic kidney disease Stage 3 , recent Klebisella UTI , Immunological Medical History: Negative for: Lupus Erythematosus; Raynauds; Scleroderma Musculoskeletal Medical History: Positive for: Gout - sporadic Negative for: Rheumatoid Arthritis; Osteoarthritis; Osteomyelitis Neurologic Medical History: Positive for: Neuropathy - bilat lower ext Negative for: Dementia; Quadriplegia; Paraplegia; Seizure Disorder Oncologic Medical History: Negative for: Received Chemotherapy; Received Radiation Psychiatric Medical History: Negative for: Anorexia/bulimia; Confinement Anxiety Immunizations Pneumococcal Vaccine: Received Pneumococcal Vaccination: No Immunization Notes: 8 years ago Implantable Devices No devices added Hospitalization / Surgery History Type of Hospitalization/Surgery kidney infection kidney infection/urinary retention Family and Social History Cancer: No; Diabetes: No;

## 2020-08-26 NOTE — Progress Notes (Signed)
to dressing change. Peri-Wound Care Triamcinolone 15 (g) Discharge Instruction: Use triamcinolone 15 (g) as directed Sween Lotion (Moisturizing lotion) Discharge Instruction: Apply moisturizing lotion as directed Topical Primary Dressing KerraCel Ag Gelling Fiber Dressing, 4x5 in (silver alginate) Discharge Instruction: Apply silver alginate to wound bed as instructed Secondary Dressing Woven Gauze Sponge, Non-Sterile 4x4 in Discharge Instruction: Apply over primary dressing as directed. ABD Pad, 5x9 Discharge Instruction: Apply over primary dressing as directed. Secured With Compression Wrap Kerlix Roll 4.5x3.1 (in/yd) Discharge Instruction: Apply Kerlix and Coban compression as directed. Coban Self-Adherent Wrap 4x5 (in/yd) Discharge Instruction: Apply over Kerlix as directed. Compression Stockings Add-Ons Electronic Signature(s) Signed: 08/23/2020 4:47:39 PM By: Sandre Kitty Signed: 08/26/2020 3:14:58 PM By: [name] Entered By: Sandre Kitty on 08/23/2020 16:18:28 -------------------------------------------------------------------------------- Wound Assessment Details Patient Name: Date of Service: [name] 08/23/2020 9:30 A M Medical Record Number: [id number] Patient Account Number: [id number] Date of Birth/Sex: Treating RN: May 28, 1945 (75 y.o. Male) [name] Primary Care Henley Blyth: [name], [name] Other Clinician: Referring Sadrac Zeoli: Treating Jeaneen Cala/Extender: [name], [name] [name], [name] Weeks in Treatment: 227 Wound Status Wound Number: 25 Primary Venous Leg Ulcer Etiology: Wound Location: Right, Lateral Lower Leg Wound Open Wounding Event: Gradually Appeared Status: Date Acquired: 07/06/2019 Comorbid Anemia, Sleep Apnea, Arrhythmia, Congestive Heart Failure, Weeks Of  Treatment: 57 History: Coronary Artery Disease, Hypertension, Myocardial Infarction, Clustered Wound: No Type II Diabetes, Gout, Neuropathy Photos Wound Measurements Length: (cm) 2 Width: (cm) 0.4 Depth: (cm) 0.1 Area: (cm) 0.628 Volume: (cm) 0.063 % Reduction in Area: 77% % Reduction in Volume: 76.9% Epithelialization: Medium (34-66%) Tunneling: No Undermining: No Wound Description Classification: Full Thickness Without Exposed Support Structures Wound Margin: Flat and Intact Exudate Amount: Medium Exudate Type: Serosanguineous Exudate Color: red, brown Foul Odor After Cleansing: No Slough/Fibrino No Wound Bed Granulation Amount: Large (67-100%) Exposed Structure Granulation Quality: Red Fascia Exposed: No Necrotic Amount: None Present (0%) Fat Layer (Subcutaneous Tissue) Exposed: Yes Tendon Exposed: No Muscle Exposed: No Joint Exposed: No Bone Exposed: No Treatment Notes Wound #25 (Lower Leg) Wound Laterality: Right, Lateral Cleanser Wound Cleanser Discharge Instruction: Cleanse the wound with wound cleanser prior to applying a clean dressing using gauze sponges, not tissue or cotton balls. Soap and Water Discharge Instruction: May shower and wash wound with dial antibacterial soap and water prior to dressing change. Peri-Wound Care Triamcinolone 15 (g) Discharge Instruction: Use triamcinolone 15 (g) as directed Sween Lotion (Moisturizing lotion) Discharge Instruction: Apply moisturizing lotion as directed Topical Primary Dressing KerraCel Ag Gelling Fiber Dressing, 4x5 in (silver alginate) Discharge Instruction: Apply silver alginate to wound bed as instructed Secondary Dressing Woven Gauze Sponge, Non-Sterile 4x4 in Discharge Instruction: Apply over primary dressing as directed. ABD Pad, 5x9 Discharge Instruction: Apply over primary dressing as directed. Secured With Compression Wrap Kerlix Roll 4.5x3.1 (in/yd) Discharge Instruction: Apply Kerlix and  Coban compression as directed. Coban Self-Adherent Wrap 4x5 (in/yd) Discharge Instruction: Apply over Kerlix as directed. Compression Stockings Add-Ons Electronic Signature(s) Signed: 08/23/2020 4:47:39 PM By: Sandre Kitty Signed: 08/26/2020 3:14:58 PM By: [name] Entered By: Sandre Kitty on 08/23/2020 16:18:02 -------------------------------------------------------------------------------- Wound Assessment Details Patient Name: Date of Service: [name] 08/23/2020 9:30 A M Medical Record Number: [id number] Patient Account Number: [id number] Date of Birth/Sex: Treating RN: October 16, 1945 (75 y.o. Male) Lorrin Jackson Primary Care Rommie Dunn: [name], [name] Other Clinician: Referring Kahmari Koller: Treating Brittain Smithey/Extender: [name], [name] [name], [name] Weeks in Treatment: 227 Wound Status Wound Number: 26 Primary Diabetic Wound/Ulcer of the Lower Extremity Etiology: Wound  F. 08/23/2020 9:30 A M Medical Record Number: [id number] Patient Account Number: [id number] Date of Birth/Sex: Treating RN: May 04, 1945 (75 y.o. Male) [name] Primary Care Jessicalynn Deshong: [name], [name] Other Clinician: Referring Zyen Triggs: Treating Toribio Seiber/Extender: [name], [name] [name], [name] Weeks in Treatment: [place] reviewed with physician Active Inactive Wound/Skin Impairment Nursing Diagnoses: Impaired tissue integrity Goals: Patient/caregiver will verbalize understanding of skin care regimen Date Initiated: 11/19/2017 Target Resolution Date: 09/07/2020 Goal Status: Active Ulcer/skin breakdown will have a volume reduction of 30% by week 4 Date Initiated: 11/19/2017 Date Inactivated: 12/02/2017 Target Resolution Date:  12/22/2017 Goal Status: Unmet Unmet Reason: multipl comorbidities Interventions: Assess patient/caregiver ability to obtain necessary supplies Assess ulceration(s) every visit Provide education on ulcer and skin care Notes: Electronic Signature(s) Signed: 08/26/2020 3:14:58 PM By: [name] Entered By: [name] on 08/23/2020 10:53:23 -------------------------------------------------------------------------------- Pain Assessment Details Patient Name: Date of Service: [name] 08/23/2020 9:30 A M Medical Record Number: [id number] Patient Account Number: [id number] Date of Birth/Sex: Treating RN: [birth year] (75 y.o. Male) Lorrin Jackson Primary Care Johnnay Pleitez: [name], [name] Other Clinician: Referring Kelsei Defino: Treating Lyanne Kates/Extender: [name], [name] [name], [name] Weeks in Treatment: 227 Active Problems Location of Pain Severity and Description of Pain Patient Has Paino No Site Locations Pain Management and Medication Current Pain Management: Electronic Signature(s) Signed: 08/23/2020 4:38:34 PM By: Lorrin Jackson Entered By: Lorrin Jackson on 08/23/2020 10:15:46 -------------------------------------------------------------------------------- Patient/Caregiver Education Details Patient Name: Date of Service: [name] 5/3/2022andnbsp9:30 A M Medical Record Number: [id number] Patient Account Number: [id number] Date of Birth/Gender: Treating RN: May 20, 1945 (75 y.o. Male) [name] Primary Care Physician: [name], [name] Other Clinician: Referring Physician: Treating Physician/Extender: [name], [name] [name], [name] Weeks in Treatment: 25 Education Assessment Education Provided To: Patient Education Topics Provided Wound/Skin Impairment: Methods: Explain/Verbal Responses: State content correctly Electronic Signature(s) Signed: 08/26/2020 3:14:58 PM By: [name] Entered By: [name] on 08/23/2020  10:37:44 -------------------------------------------------------------------------------- Wound Assessment Details Patient Name: Date of Service: [name] 08/23/2020 9:30 A M Medical Record Number: [id number] Patient Account Number: [id number] Date of Birth/Sex: Treating RN: 10-26-1945 (75 y.o. Male) [name] Primary Care Yanelle Sousa: [name], [name] Other Clinician: Referring Laurajean Hosek: Treating Iris Tatsch/Extender: [name], [name] [name], [name] Weeks in Treatment: 227 Wound Status Wound Number: 14 Primary Diabetic Wound/Ulcer of the Lower Extremity Etiology: Wound Location: Left, Lateral Lower Leg Wound Open Wounding Event: Gradually Appeared Status: Date Acquired: 08/15/2017 Comorbid Anemia, Sleep Apnea, Arrhythmia, Congestive Heart Failure, Weeks Of Treatment: 157 History: Coronary Artery Disease, Hypertension, Myocardial Infarction, Clustered Wound: Yes Type II Diabetes, Gout, Neuropathy Photos Wound Measurements Length: (cm) 10.5 Width: (cm) 1.2 Depth: (cm) 0.2 Clustered Quantity: 1 Area: (cm) 9.896 Volume: (cm) 1.979 % Reduction in Area: -950.5% % Reduction in Volume: -2005.3% Epithelialization: Small (1-33%) Tunneling: No Undermining: No Wound Description Classification: Grade 2 Wound Margin: Distinct, outline attached Exudate Amount: Medium Exudate Type: Serosanguineous Exudate Color: red, brown Foul Odor After Cleansing: No Slough/Fibrino Yes Wound Bed Granulation Amount: Medium (34-66%) Exposed Structure Granulation Quality: Red Fascia Exposed: No Necrotic Amount: Medium (34-66%) Fat Layer (Subcutaneous Tissue) Exposed: Yes Necrotic Quality: Eschar, Adherent Slough Tendon Exposed: No Muscle Exposed: No Joint Exposed: No Bone Exposed: No Treatment Notes Wound #14 (Lower Leg) Wound Laterality: Left, Lateral Cleanser Wound Cleanser Discharge Instruction: Cleanse the wound with wound cleanser prior to applying a clean dressing using  gauze sponges, not tissue or cotton balls. Soap and Water Discharge Instruction: May shower and wash wound with dial antibacterial soap and water prior  Discharge Instruction: Use triamcinolone 15 (g) as directed Sween Lotion (Moisturizing lotion) Discharge Instruction: Apply moisturizing lotion as directed Topical Primary Dressing KerraCel Ag Gelling Fiber Dressing, 4x5 in (silver alginate) Discharge Instruction: Apply silver alginate to wound bed as instructed Secondary Dressing Woven Gauze Sponge,  Non-Sterile 4x4 in Discharge Instruction: Apply over primary dressing as directed. ABD Pad, 5x9 Discharge Instruction: Apply over primary dressing as directed. Secured With Compression Wrap Kerlix Roll 4.5x3.1 (in/yd) Discharge Instruction: Apply Kerlix and Coban compression as directed. Coban Self-Adherent Wrap 4x5 (in/yd) Discharge Instruction: Apply over Kerlix as directed. Compression Stockings Add-Ons Electronic Signature(s) Signed: 08/23/2020 4:47:39 PM By: Sandre Kitty Signed: 08/26/2020 3:14:58 PM By: [name] Entered By: Sandre Kitty on 08/23/2020 16:17:08 -------------------------------------------------------------------------------- Wound Assessment Details Patient Name: Date of Service: [name] 08/23/2020 9:30 A M Medical Record Number: [id number] Patient Account Number: [id number] Date of Birth/Sex: Treating RN: 1945/12/02 (75 y.o. Male) [name] Primary Care Pookela Sellin: [name], [name] Other Clinician: Referring Liannah Yarbough: Treating Bedford Winsor/Extender: [name], [name] [name], [name] Weeks in Treatment: 227 Wound Status Wound Number: 32 Primary Diabetic Wound/Ulcer of the Lower Extremity Etiology: Wound Location: Left, Distal, Dorsal Foot Wound Open Wounding Event: Gradually Appeared Status: Date Acquired: 05/03/2020 Comorbid Anemia, Sleep Apnea, Arrhythmia, Congestive Heart Failure, Weeks Of Treatment: 16 History: Coronary Artery Disease, Hypertension, Myocardial Infarction, Clustered Wound: No Type II Diabetes, Gout, Neuropathy Photos Wound Measurements Length: (cm) 0.1 Width: (cm) 0.1 Depth: (cm) 0.1 Area: (cm) 0.008 Volume: (cm) 0.001 % Reduction in Area: 99.5% % Reduction in Volume: 99.4% Epithelialization: Large (67-100%) Tunneling: No Undermining: No Wound Description Classification: Grade 2 Wound Margin: Indistinct, nonvisible Exudate Amount: Medium Exudate Type: Serosanguineous Exudate Color: red,  brown Foul Odor After Cleansing: No Slough/Fibrino No Wound Bed Granulation Amount: None Present (0%) Exposed Structure Necrotic Amount: None Present (0%) Fascia Exposed: No Fat Layer (Subcutaneous Tissue) Exposed: No Tendon Exposed: No Muscle Exposed: No Joint Exposed: No Bone Exposed: No Treatment Notes Wound #32 (Foot) Wound Laterality: Dorsal, Left, Distal Cleanser Wound Cleanser Discharge Instruction: Cleanse the wound with wound cleanser prior to applying a clean dressing using gauze sponges, not tissue or cotton balls. Soap and Water Discharge Instruction: May shower and wash wound with dial antibacterial soap and water prior to dressing change. Peri-Wound Care Triamcinolone 15 (g) Discharge Instruction: Use triamcinolone 15 (g) as directed Sween Lotion (Moisturizing lotion) Discharge Instruction: Apply moisturizing lotion as directed Topical Primary Dressing KerraCel Ag Gelling Fiber Dressing, 4x5 in (silver alginate) Discharge Instruction: Apply silver alginate to wound bed as instructed Secondary Dressing Woven Gauze Sponge, Non-Sterile 4x4 in Discharge Instruction: Apply over primary dressing as directed. ABD Pad, 5x9 Discharge Instruction: Apply over primary dressing as directed. Secured With Compression Wrap Kerlix Roll 4.5x3.1 (in/yd) Discharge Instruction: Apply Kerlix and Coban compression as directed. Coban Self-Adherent Wrap 4x5 (in/yd) Discharge Instruction: Apply over Kerlix as directed. Compression Stockings Add-Ons Electronic Signature(s) Signed: 08/23/2020 4:47:39 PM By: Sandre Kitty Signed: 08/26/2020 3:14:58 PM By: [name] Entered By: Sandre Kitty on 08/23/2020 16:19:32 -------------------------------------------------------------------------------- Wound Assessment Details Patient Name: Date of Service: [name] 08/23/2020 9:30 A M Medical Record Number: [id number] Patient Account Number: [id number] Date of  Birth/Sex: Treating RN: [birth year] (75 y.o. Male) [name] Primary Care Lindberg Zenon: [name], [name] Other Clinician: Referring Raynesha Tiedt: Treating Addyson Traub/Extender: [name], [name] [name], [name] Weeks in Treatment: 227 Wound Status Wound Number: 34 Primary Diabetic Wound/Ulcer of the Lower Extremity Etiology: Wound Location: Right, Medial, Dorsal Foot Wound Open Wounding Event: Gradually Appeared  g) as directed Sween Lotion (Moisturizing lotion) Discharge Instruction: Apply moisturizing lotion as directed Topical Primary Dressing KerraCel Ag Gelling Fiber Dressing, 4x5 in (silver alginate) Discharge Instruction: Apply silver alginate to wound bed as instructed Secondary Dressing Woven Gauze Sponge, Non-Sterile 4x4 in Discharge Instruction: Apply over primary dressing as directed. ABD Pad, 5x9 Discharge Instruction: Apply over primary dressing as directed. Secured With Compression Wrap Kerlix Roll 4.5x3.1 (in/yd) Discharge Instruction: Apply Kerlix and Coban compression as directed. Coban Self-Adherent Wrap 4x5 (in/yd) Discharge Instruction: Apply over Kerlix as directed. Compression Stockings Add-Ons Electronic Signature(s) Signed: 08/23/2020 4:38:34 PM By: Lorrin Jackson Signed: 08/23/2020 4:47:39 PM By:  Sandre Kitty Entered By: Sandre Kitty on 08/23/2020 16:21:28 -------------------------------------------------------------------------------- Wound Assessment Details Patient Name: Date of Service: [name] 08/23/2020 9:30 A M Medical Record Number: [id number] Patient Account Number: [id number] Date of Birth/Sex: Treating RN: Jul 10, 1945 (75 y.o. Male) [name] Primary Care Sheral Pfahler: [name], [name] Other Clinician: Referring Joby Hershkowitz: Treating Almedia Cordell/Extender: [name], [name] [name], [name] Weeks in Treatment: 227 Wound Status Wound Number: 36 Primary Venous Leg Ulcer Etiology: Wound Location: Left, Lateral Foot Wound Open Wounding Event: Gradually Appeared Status: Date Acquired: 07/19/2020 Comorbid Anemia, Sleep Apnea, Arrhythmia, Congestive Heart Failure, Weeks Of Treatment: 4 History: Coronary Artery Disease, Hypertension, Myocardial Infarction, Clustered Wound: No Type II Diabetes, Gout, Neuropathy Photos Wound Measurements Length: (cm) 2.5 Width: (cm) 1.5 Depth: (cm) 1.1 Area: (cm) 2.945 Volume: (cm) 3.24 % Reduction in Area: 31.3% % Reduction in Volume: -277.6% Epithelialization: None Tunneling: No Undermining: No Wound Description Classification: Full Thickness Without Exposed Support Structures Wound Margin: Flat and Intact Exudate Amount: Medium Exudate Type: Serosanguineous Exudate Color: red, brown Foul Odor After Cleansing: Yes Due to Product Use: No Slough/Fibrino Yes Wound Bed Granulation Amount: Small (1-33%) Exposed Structure Granulation Quality: Pink Fascia Exposed: No Necrotic Amount: Large (67-100%) Fat Layer (Subcutaneous Tissue) Exposed: Yes Necrotic Quality: Adherent Slough Tendon Exposed: No Muscle Exposed: No Joint Exposed: No Bone Exposed: No Treatment Notes Wound #36 (Foot) Wound Laterality: Left, Lateral Cleanser Wound Cleanser Discharge Instruction: Cleanse the wound with wound cleanser prior to  applying a clean dressing using gauze sponges, not tissue or cotton balls. Soap and Water Discharge Instruction: May shower and wash wound with dial antibacterial soap and water prior to dressing change. Peri-Wound Care Triamcinolone 15 (g) Discharge Instruction: Use triamcinolone 15 (g) as directed Sween Lotion (Moisturizing lotion) Discharge Instruction: Apply moisturizing lotion as directed Topical Primary Dressing KerraCel Ag Gelling Fiber Dressing, 4x5 in (silver alginate) Discharge Instruction: Apply silver alginate to wound bed as instructed Secondary Dressing Woven Gauze Sponge, Non-Sterile 4x4 in Discharge Instruction: Apply over primary dressing as directed. ABD Pad, 5x9 Discharge Instruction: Apply over primary dressing as directed. Secured With Compression Wrap Kerlix Roll 4.5x3.1 (in/yd) Discharge Instruction: Apply Kerlix and Coban compression as directed. Coban Self-Adherent Wrap 4x5 (in/yd) Discharge Instruction: Apply over Kerlix as directed. Compression Stockings Add-Ons Electronic Signature(s) Signed: 08/23/2020 4:47:39 PM By: Sandre Kitty Signed: 08/26/2020 3:14:58 PM By: [name] Entered By: Sandre Kitty on 08/23/2020 16:18:59 -------------------------------------------------------------------------------- Vitals Details Patient Name: Date of Service: [name] 08/23/2020 9:30 A M Medical Record Number: [id number] Patient Account Number: [id number] Date of Birth/Sex: Treating RN: [birth year] (75 y.o. Male) Lorrin Jackson Primary Care Lenore Moyano: [name], [name] Other Clinician: Referring Vallarie Fei: Treating Kaelyn Nauta/Extender: [name], [name] [name], [name] Weeks in Treatment: 227 Vital Signs Time Taken: 10:10 Temperature (F): 98.4 Height (in): 74 Pulse (bpm): 80 Weight (lbs): 150 Respiratory Rate (breaths/min): 18 Body Mass Index (  to dressing change. Peri-Wound Care Triamcinolone 15 (g) Discharge Instruction: Use triamcinolone 15 (g) as directed Sween Lotion (Moisturizing lotion) Discharge Instruction: Apply moisturizing lotion as directed Topical Primary Dressing KerraCel Ag Gelling Fiber Dressing, 4x5 in (silver alginate) Discharge Instruction: Apply silver alginate to wound bed as instructed Secondary Dressing Woven Gauze Sponge, Non-Sterile 4x4 in Discharge Instruction: Apply over primary dressing as directed. ABD Pad, 5x9 Discharge Instruction: Apply over primary dressing as directed. Secured With Compression Wrap Kerlix Roll 4.5x3.1 (in/yd) Discharge Instruction: Apply Kerlix and Coban compression as directed. Coban Self-Adherent Wrap 4x5 (in/yd) Discharge Instruction: Apply over Kerlix as directed. Compression Stockings Add-Ons Electronic Signature(s) Signed: 08/23/2020 4:47:39 PM By: Sandre Kitty Signed: 08/26/2020 3:14:58 PM By: [name] Entered By: Sandre Kitty on 08/23/2020 16:18:28 -------------------------------------------------------------------------------- Wound Assessment Details Patient Name: Date of Service: [name] 08/23/2020 9:30 A M Medical Record Number: [id number] Patient Account Number: [id number] Date of Birth/Sex: Treating RN: May 28, 1945 (75 y.o. Male) [name] Primary Care Henley Blyth: [name], [name] Other Clinician: Referring Sadrac Zeoli: Treating Jeaneen Cala/Extender: [name], [name] [name], [name] Weeks in Treatment: 227 Wound Status Wound Number: 25 Primary Venous Leg Ulcer Etiology: Wound Location: Right, Lateral Lower Leg Wound Open Wounding Event: Gradually Appeared Status: Date Acquired: 07/06/2019 Comorbid Anemia, Sleep Apnea, Arrhythmia, Congestive Heart Failure, Weeks Of  Treatment: 57 History: Coronary Artery Disease, Hypertension, Myocardial Infarction, Clustered Wound: No Type II Diabetes, Gout, Neuropathy Photos Wound Measurements Length: (cm) 2 Width: (cm) 0.4 Depth: (cm) 0.1 Area: (cm) 0.628 Volume: (cm) 0.063 % Reduction in Area: 77% % Reduction in Volume: 76.9% Epithelialization: Medium (34-66%) Tunneling: No Undermining: No Wound Description Classification: Full Thickness Without Exposed Support Structures Wound Margin: Flat and Intact Exudate Amount: Medium Exudate Type: Serosanguineous Exudate Color: red, brown Foul Odor After Cleansing: No Slough/Fibrino No Wound Bed Granulation Amount: Large (67-100%) Exposed Structure Granulation Quality: Red Fascia Exposed: No Necrotic Amount: None Present (0%) Fat Layer (Subcutaneous Tissue) Exposed: Yes Tendon Exposed: No Muscle Exposed: No Joint Exposed: No Bone Exposed: No Treatment Notes Wound #25 (Lower Leg) Wound Laterality: Right, Lateral Cleanser Wound Cleanser Discharge Instruction: Cleanse the wound with wound cleanser prior to applying a clean dressing using gauze sponges, not tissue or cotton balls. Soap and Water Discharge Instruction: May shower and wash wound with dial antibacterial soap and water prior to dressing change. Peri-Wound Care Triamcinolone 15 (g) Discharge Instruction: Use triamcinolone 15 (g) as directed Sween Lotion (Moisturizing lotion) Discharge Instruction: Apply moisturizing lotion as directed Topical Primary Dressing KerraCel Ag Gelling Fiber Dressing, 4x5 in (silver alginate) Discharge Instruction: Apply silver alginate to wound bed as instructed Secondary Dressing Woven Gauze Sponge, Non-Sterile 4x4 in Discharge Instruction: Apply over primary dressing as directed. ABD Pad, 5x9 Discharge Instruction: Apply over primary dressing as directed. Secured With Compression Wrap Kerlix Roll 4.5x3.1 (in/yd) Discharge Instruction: Apply Kerlix and  Coban compression as directed. Coban Self-Adherent Wrap 4x5 (in/yd) Discharge Instruction: Apply over Kerlix as directed. Compression Stockings Add-Ons Electronic Signature(s) Signed: 08/23/2020 4:47:39 PM By: Sandre Kitty Signed: 08/26/2020 3:14:58 PM By: [name] Entered By: Sandre Kitty on 08/23/2020 16:18:02 -------------------------------------------------------------------------------- Wound Assessment Details Patient Name: Date of Service: [name] 08/23/2020 9:30 A M Medical Record Number: [id number] Patient Account Number: [id number] Date of Birth/Sex: Treating RN: October 16, 1945 (75 y.o. Male) Lorrin Jackson Primary Care Rommie Dunn: [name], [name] Other Clinician: Referring Kahmari Koller: Treating Brittain Smithey/Extender: [name], [name] [name], [name] Weeks in Treatment: 227 Wound Status Wound Number: 26 Primary Diabetic Wound/Ulcer of the Lower Extremity Etiology: Wound  g) as directed Sween Lotion (Moisturizing lotion) Discharge Instruction: Apply moisturizing lotion as directed Topical Primary Dressing KerraCel Ag Gelling Fiber Dressing, 4x5 in (silver alginate) Discharge Instruction: Apply silver alginate to wound bed as instructed Secondary Dressing Woven Gauze Sponge, Non-Sterile 4x4 in Discharge Instruction: Apply over primary dressing as directed. ABD Pad, 5x9 Discharge Instruction: Apply over primary dressing as directed. Secured With Compression Wrap Kerlix Roll 4.5x3.1 (in/yd) Discharge Instruction: Apply Kerlix and Coban compression as directed. Coban Self-Adherent Wrap 4x5 (in/yd) Discharge Instruction: Apply over Kerlix as directed. Compression Stockings Add-Ons Electronic Signature(s) Signed: 08/23/2020 4:38:34 PM By: Lorrin Jackson Signed: 08/23/2020 4:47:39 PM By:  Sandre Kitty Entered By: Sandre Kitty on 08/23/2020 16:21:28 -------------------------------------------------------------------------------- Wound Assessment Details Patient Name: Date of Service: [name] 08/23/2020 9:30 A M Medical Record Number: [id number] Patient Account Number: [id number] Date of Birth/Sex: Treating RN: Jul 10, 1945 (75 y.o. Male) [name] Primary Care Sheral Pfahler: [name], [name] Other Clinician: Referring Joby Hershkowitz: Treating Almedia Cordell/Extender: [name], [name] [name], [name] Weeks in Treatment: 227 Wound Status Wound Number: 36 Primary Venous Leg Ulcer Etiology: Wound Location: Left, Lateral Foot Wound Open Wounding Event: Gradually Appeared Status: Date Acquired: 07/19/2020 Comorbid Anemia, Sleep Apnea, Arrhythmia, Congestive Heart Failure, Weeks Of Treatment: 4 History: Coronary Artery Disease, Hypertension, Myocardial Infarction, Clustered Wound: No Type II Diabetes, Gout, Neuropathy Photos Wound Measurements Length: (cm) 2.5 Width: (cm) 1.5 Depth: (cm) 1.1 Area: (cm) 2.945 Volume: (cm) 3.24 % Reduction in Area: 31.3% % Reduction in Volume: -277.6% Epithelialization: None Tunneling: No Undermining: No Wound Description Classification: Full Thickness Without Exposed Support Structures Wound Margin: Flat and Intact Exudate Amount: Medium Exudate Type: Serosanguineous Exudate Color: red, brown Foul Odor After Cleansing: Yes Due to Product Use: No Slough/Fibrino Yes Wound Bed Granulation Amount: Small (1-33%) Exposed Structure Granulation Quality: Pink Fascia Exposed: No Necrotic Amount: Large (67-100%) Fat Layer (Subcutaneous Tissue) Exposed: Yes Necrotic Quality: Adherent Slough Tendon Exposed: No Muscle Exposed: No Joint Exposed: No Bone Exposed: No Treatment Notes Wound #36 (Foot) Wound Laterality: Left, Lateral Cleanser Wound Cleanser Discharge Instruction: Cleanse the wound with wound cleanser prior to  applying a clean dressing using gauze sponges, not tissue or cotton balls. Soap and Water Discharge Instruction: May shower and wash wound with dial antibacterial soap and water prior to dressing change. Peri-Wound Care Triamcinolone 15 (g) Discharge Instruction: Use triamcinolone 15 (g) as directed Sween Lotion (Moisturizing lotion) Discharge Instruction: Apply moisturizing lotion as directed Topical Primary Dressing KerraCel Ag Gelling Fiber Dressing, 4x5 in (silver alginate) Discharge Instruction: Apply silver alginate to wound bed as instructed Secondary Dressing Woven Gauze Sponge, Non-Sterile 4x4 in Discharge Instruction: Apply over primary dressing as directed. ABD Pad, 5x9 Discharge Instruction: Apply over primary dressing as directed. Secured With Compression Wrap Kerlix Roll 4.5x3.1 (in/yd) Discharge Instruction: Apply Kerlix and Coban compression as directed. Coban Self-Adherent Wrap 4x5 (in/yd) Discharge Instruction: Apply over Kerlix as directed. Compression Stockings Add-Ons Electronic Signature(s) Signed: 08/23/2020 4:47:39 PM By: Sandre Kitty Signed: 08/26/2020 3:14:58 PM By: [name] Entered By: Sandre Kitty on 08/23/2020 16:18:59 -------------------------------------------------------------------------------- Vitals Details Patient Name: Date of Service: [name] 08/23/2020 9:30 A M Medical Record Number: [id number] Patient Account Number: [id number] Date of Birth/Sex: Treating RN: [birth year] (75 y.o. Male) Lorrin Jackson Primary Care Lenore Moyano: [name], [name] Other Clinician: Referring Vallarie Fei: Treating Kaelyn Nauta/Extender: [name], [name] [name], [name] Weeks in Treatment: 227 Vital Signs Time Taken: 10:10 Temperature (F): 98.4 Height (in): 74 Pulse (bpm): 80 Weight (lbs): 150 Respiratory Rate (breaths/min): 18 Body Mass Index (  Discharge Instruction: Use triamcinolone 15 (g) as directed Sween Lotion (Moisturizing lotion) Discharge Instruction: Apply moisturizing lotion as directed Topical Primary Dressing KerraCel Ag Gelling Fiber Dressing, 4x5 in (silver alginate) Discharge Instruction: Apply silver alginate to wound bed as instructed Secondary Dressing Woven Gauze Sponge,  Non-Sterile 4x4 in Discharge Instruction: Apply over primary dressing as directed. ABD Pad, 5x9 Discharge Instruction: Apply over primary dressing as directed. Secured With Compression Wrap Kerlix Roll 4.5x3.1 (in/yd) Discharge Instruction: Apply Kerlix and Coban compression as directed. Coban Self-Adherent Wrap 4x5 (in/yd) Discharge Instruction: Apply over Kerlix as directed. Compression Stockings Add-Ons Electronic Signature(s) Signed: 08/23/2020 4:47:39 PM By: Sandre Kitty Signed: 08/26/2020 3:14:58 PM By: [name] Entered By: Sandre Kitty on 08/23/2020 16:17:08 -------------------------------------------------------------------------------- Wound Assessment Details Patient Name: Date of Service: [name] 08/23/2020 9:30 A M Medical Record Number: [id number] Patient Account Number: [id number] Date of Birth/Sex: Treating RN: 1945/12/02 (75 y.o. Male) [name] Primary Care Pookela Sellin: [name], [name] Other Clinician: Referring Liannah Yarbough: Treating Bedford Winsor/Extender: [name], [name] [name], [name] Weeks in Treatment: 227 Wound Status Wound Number: 32 Primary Diabetic Wound/Ulcer of the Lower Extremity Etiology: Wound Location: Left, Distal, Dorsal Foot Wound Open Wounding Event: Gradually Appeared Status: Date Acquired: 05/03/2020 Comorbid Anemia, Sleep Apnea, Arrhythmia, Congestive Heart Failure, Weeks Of Treatment: 16 History: Coronary Artery Disease, Hypertension, Myocardial Infarction, Clustered Wound: No Type II Diabetes, Gout, Neuropathy Photos Wound Measurements Length: (cm) 0.1 Width: (cm) 0.1 Depth: (cm) 0.1 Area: (cm) 0.008 Volume: (cm) 0.001 % Reduction in Area: 99.5% % Reduction in Volume: 99.4% Epithelialization: Large (67-100%) Tunneling: No Undermining: No Wound Description Classification: Grade 2 Wound Margin: Indistinct, nonvisible Exudate Amount: Medium Exudate Type: Serosanguineous Exudate Color: red,  brown Foul Odor After Cleansing: No Slough/Fibrino No Wound Bed Granulation Amount: None Present (0%) Exposed Structure Necrotic Amount: None Present (0%) Fascia Exposed: No Fat Layer (Subcutaneous Tissue) Exposed: No Tendon Exposed: No Muscle Exposed: No Joint Exposed: No Bone Exposed: No Treatment Notes Wound #32 (Foot) Wound Laterality: Dorsal, Left, Distal Cleanser Wound Cleanser Discharge Instruction: Cleanse the wound with wound cleanser prior to applying a clean dressing using gauze sponges, not tissue or cotton balls. Soap and Water Discharge Instruction: May shower and wash wound with dial antibacterial soap and water prior to dressing change. Peri-Wound Care Triamcinolone 15 (g) Discharge Instruction: Use triamcinolone 15 (g) as directed Sween Lotion (Moisturizing lotion) Discharge Instruction: Apply moisturizing lotion as directed Topical Primary Dressing KerraCel Ag Gelling Fiber Dressing, 4x5 in (silver alginate) Discharge Instruction: Apply silver alginate to wound bed as instructed Secondary Dressing Woven Gauze Sponge, Non-Sterile 4x4 in Discharge Instruction: Apply over primary dressing as directed. ABD Pad, 5x9 Discharge Instruction: Apply over primary dressing as directed. Secured With Compression Wrap Kerlix Roll 4.5x3.1 (in/yd) Discharge Instruction: Apply Kerlix and Coban compression as directed. Coban Self-Adherent Wrap 4x5 (in/yd) Discharge Instruction: Apply over Kerlix as directed. Compression Stockings Add-Ons Electronic Signature(s) Signed: 08/23/2020 4:47:39 PM By: Sandre Kitty Signed: 08/26/2020 3:14:58 PM By: [name] Entered By: Sandre Kitty on 08/23/2020 16:19:32 -------------------------------------------------------------------------------- Wound Assessment Details Patient Name: Date of Service: [name] 08/23/2020 9:30 A M Medical Record Number: [id number] Patient Account Number: [id number] Date of  Birth/Sex: Treating RN: [birth year] (75 y.o. Male) [name] Primary Care Lindberg Zenon: [name], [name] Other Clinician: Referring Raynesha Tiedt: Treating Addyson Traub/Extender: [name], [name] [name], [name] Weeks in Treatment: 227 Wound Status Wound Number: 34 Primary Diabetic Wound/Ulcer of the Lower Extremity Etiology: Wound Location: Right, Medial, Dorsal Foot Wound Open Wounding Event: Gradually Appeared  F. 08/23/2020 9:30 A M Medical Record Number: [id number] Patient Account Number: [id number] Date of Birth/Sex: Treating RN: May 04, 1945 (75 y.o. Male) [name] Primary Care Jessicalynn Deshong: [name], [name] Other Clinician: Referring Zyen Triggs: Treating Toribio Seiber/Extender: [name], [name] [name], [name] Weeks in Treatment: Go[place]ner reviewed with physician Active Inactive Wound/Skin Impairment Nursing Diagnoses: Impaired tissue integrity Goals: Patient/caregiver will verbalize understanding of skin care regimen Date Initiated: 11/19/2017 Target Resolution Date: 09/07/2020 Goal Status: Active Ulcer/skin breakdown will have a volume reduction of 30% by week 4 Date Initiated: 11/19/2017 Date Inactivated: 12/02/2017 Target Resolution Date:  12/22/2017 Goal Status: Unmet Unmet Reason: multipl comorbidities Interventions: Assess patient/caregiver ability to obtain necessary supplies Assess ulceration(s) every visit Provide education on ulcer and skin care Notes: Electronic Signature(s) Signed: 08/26/2020 3:14:58 PM By: [name] Entered By: [name] on 08/23/2020 10:53:23 -------------------------------------------------------------------------------- Pain Assessment Details Patient Name: Date of Service: [name] 08/23/2020 9:30 A M Medical Record Number: [id number] Patient Account Number: [id number] Date of Birth/Sex: Treating RN: [birth year] (75 y.o. Male) Lorrin Jackson Primary Care Johnnay Pleitez: [name], [name] Other Clinician: Referring Kelsei Defino: Treating Lyanne Kates/Extender: [name], [name] [name], [name] Weeks in Treatment: 227 Active Problems Location of Pain Severity and Description of Pain Patient Has Paino No Site Locations Pain Management and Medication Current Pain Management: Electronic Signature(s) Signed: 08/23/2020 4:38:34 PM By: Lorrin Jackson Entered By: Lorrin Jackson on 08/23/2020 10:15:46 -------------------------------------------------------------------------------- Patient/Caregiver Education Details Patient Name: Date of Service: [name] 5/3/2022andnbsp9:30 A M Medical Record Number: [id number] Patient Account Number: [id number] Date of Birth/Gender: Treating RN: May 20, 1945 (75 y.o. Male) [name] Primary Care Physician: [name], [name] Other Clinician: Referring Physician: Treating Physician/Extender: [name], [name] [name], [name] Weeks in Treatment: 25 Education Assessment Education Provided To: Patient Education Topics Provided Wound/Skin Impairment: Methods: Explain/Verbal Responses: State content correctly Electronic Signature(s) Signed: 08/26/2020 3:14:58 PM By: [name] Entered By: [name] on 08/23/2020  10:37:44 -------------------------------------------------------------------------------- Wound Assessment Details Patient Name: Date of Service: [name] 08/23/2020 9:30 A M Medical Record Number: [id number] Patient Account Number: [id number] Date of Birth/Sex: Treating RN: 10-26-1945 (75 y.o. Male) [name] Primary Care Yanelle Sousa: [name], [name] Other Clinician: Referring Laurajean Hosek: Treating Iris Tatsch/Extender: [name], [name] [name], [name] Weeks in Treatment: 227 Wound Status Wound Number: 14 Primary Diabetic Wound/Ulcer of the Lower Extremity Etiology: Wound Location: Left, Lateral Lower Leg Wound Open Wounding Event: Gradually Appeared Status: Date Acquired: 08/15/2017 Comorbid Anemia, Sleep Apnea, Arrhythmia, Congestive Heart Failure, Weeks Of Treatment: 157 History: Coronary Artery Disease, Hypertension, Myocardial Infarction, Clustered Wound: Yes Type II Diabetes, Gout, Neuropathy Photos Wound Measurements Length: (cm) 10.5 Width: (cm) 1.2 Depth: (cm) 0.2 Clustered Quantity: 1 Area: (cm) 9.896 Volume: (cm) 1.979 % Reduction in Area: -950.5% % Reduction in Volume: -2005.3% Epithelialization: Small (1-33%) Tunneling: No Undermining: No Wound Description Classification: Grade 2 Wound Margin: Distinct, outline attached Exudate Amount: Medium Exudate Type: Serosanguineous Exudate Color: red, brown Foul Odor After Cleansing: No Slough/Fibrino Yes Wound Bed Granulation Amount: Medium (34-66%) Exposed Structure Granulation Quality: Red Fascia Exposed: No Necrotic Amount: Medium (34-66%) Fat Layer (Subcutaneous Tissue) Exposed: Yes Necrotic Quality: Eschar, Adherent Slough Tendon Exposed: No Muscle Exposed: No Joint Exposed: No Bone Exposed: No Treatment Notes Wound #14 (Lower Leg) Wound Laterality: Left, Lateral Cleanser Wound Cleanser Discharge Instruction: Cleanse the wound with wound cleanser prior to applying a clean dressing using  gauze sponges, not tissue or cotton balls. Soap and Water Discharge Instruction: May shower and wash wound with dial antibacterial soap and water prior  Discharge Instruction: Use triamcinolone 15 (g) as directed Sween Lotion (Moisturizing lotion) Discharge Instruction: Apply moisturizing lotion as directed Topical Primary Dressing KerraCel Ag Gelling Fiber Dressing, 4x5 in (silver alginate) Discharge Instruction: Apply silver alginate to wound bed as instructed Secondary Dressing Woven Gauze Sponge,  Non-Sterile 4x4 in Discharge Instruction: Apply over primary dressing as directed. ABD Pad, 5x9 Discharge Instruction: Apply over primary dressing as directed. Secured With Compression Wrap Kerlix Roll 4.5x3.1 (in/yd) Discharge Instruction: Apply Kerlix and Coban compression as directed. Coban Self-Adherent Wrap 4x5 (in/yd) Discharge Instruction: Apply over Kerlix as directed. Compression Stockings Add-Ons Electronic Signature(s) Signed: 08/23/2020 4:47:39 PM By: Sandre Kitty Signed: 08/26/2020 3:14:58 PM By: [name] Entered By: Sandre Kitty on 08/23/2020 16:17:08 -------------------------------------------------------------------------------- Wound Assessment Details Patient Name: Date of Service: [name] 08/23/2020 9:30 A M Medical Record Number: [id number] Patient Account Number: [id number] Date of Birth/Sex: Treating RN: 1945/12/02 (75 y.o. Male) [name] Primary Care Pookela Sellin: [name], [name] Other Clinician: Referring Liannah Yarbough: Treating Bedford Winsor/Extender: [name], [name] [name], [name] Weeks in Treatment: 227 Wound Status Wound Number: 32 Primary Diabetic Wound/Ulcer of the Lower Extremity Etiology: Wound Location: Left, Distal, Dorsal Foot Wound Open Wounding Event: Gradually Appeared Status: Date Acquired: 05/03/2020 Comorbid Anemia, Sleep Apnea, Arrhythmia, Congestive Heart Failure, Weeks Of Treatment: 16 History: Coronary Artery Disease, Hypertension, Myocardial Infarction, Clustered Wound: No Type II Diabetes, Gout, Neuropathy Photos Wound Measurements Length: (cm) 0.1 Width: (cm) 0.1 Depth: (cm) 0.1 Area: (cm) 0.008 Volume: (cm) 0.001 % Reduction in Area: 99.5% % Reduction in Volume: 99.4% Epithelialization: Large (67-100%) Tunneling: No Undermining: No Wound Description Classification: Grade 2 Wound Margin: Indistinct, nonvisible Exudate Amount: Medium Exudate Type: Serosanguineous Exudate Color: red,  brown Foul Odor After Cleansing: No Slough/Fibrino No Wound Bed Granulation Amount: None Present (0%) Exposed Structure Necrotic Amount: None Present (0%) Fascia Exposed: No Fat Layer (Subcutaneous Tissue) Exposed: No Tendon Exposed: No Muscle Exposed: No Joint Exposed: No Bone Exposed: No Treatment Notes Wound #32 (Foot) Wound Laterality: Dorsal, Left, Distal Cleanser Wound Cleanser Discharge Instruction: Cleanse the wound with wound cleanser prior to applying a clean dressing using gauze sponges, not tissue or cotton balls. Soap and Water Discharge Instruction: May shower and wash wound with dial antibacterial soap and water prior to dressing change. Peri-Wound Care Triamcinolone 15 (g) Discharge Instruction: Use triamcinolone 15 (g) as directed Sween Lotion (Moisturizing lotion) Discharge Instruction: Apply moisturizing lotion as directed Topical Primary Dressing KerraCel Ag Gelling Fiber Dressing, 4x5 in (silver alginate) Discharge Instruction: Apply silver alginate to wound bed as instructed Secondary Dressing Woven Gauze Sponge, Non-Sterile 4x4 in Discharge Instruction: Apply over primary dressing as directed. ABD Pad, 5x9 Discharge Instruction: Apply over primary dressing as directed. Secured With Compression Wrap Kerlix Roll 4.5x3.1 (in/yd) Discharge Instruction: Apply Kerlix and Coban compression as directed. Coban Self-Adherent Wrap 4x5 (in/yd) Discharge Instruction: Apply over Kerlix as directed. Compression Stockings Add-Ons Electronic Signature(s) Signed: 08/23/2020 4:47:39 PM By: Sandre Kitty Signed: 08/26/2020 3:14:58 PM By: [name] Entered By: Sandre Kitty on 08/23/2020 16:19:32 -------------------------------------------------------------------------------- Wound Assessment Details Patient Name: Date of Service: [name] 08/23/2020 9:30 A M Medical Record Number: [id number] Patient Account Number: [id number] Date of  Birth/Sex: Treating RN: [birth year] (75 y.o. Male) [name] Primary Care Lindberg Zenon: [name], [name] Other Clinician: Referring Raynesha Tiedt: Treating Addyson Traub/Extender: [name], [name] [name], [name] Weeks in Treatment: 227 Wound Status Wound Number: 34 Primary Diabetic Wound/Ulcer of the Lower Extremity Etiology: Wound Location: Right, Medial, Dorsal Foot Wound Open Wounding Event: Gradually Appeared  VERONICA, GILHOOLEY ([id number]) Visit Report for 08/23/2020 Arrival Information Details Patient Name: Date of Service: [name] [facility] F. 08/23/2020 9:30 A M Medical Record Number: [id number] Patient Account Number: [id number] Date of Birth/Sex: Treating RN: 1946/03/04 (75 y.o. Male) Lorrin Jackson Primary Care Onika Gudiel: [name], [name] Other Clinician: Referring Teagon Kron: Treating Tamiko Leopard/Extender: [name], [name] [name], [name] Weeks in Treatment: 43 Visit Information History Since Last Visit Added or deleted any medications: No Patient Arrived: Wheel Chair Any new allergies or adverse reactions: No Arrival Time: 10:13 Had a fall or experienced change in No Transfer Assistance: Manual activities of daily living that may affect Patient Identification Verified: Yes risk of falls: Secondary Verification Process Completed: Yes Signs or symptoms of abuse/neglect since last visito No Patient Requires Transmission-Based Precautions: No Hospitalized since last visit: No Patient Has Alerts: Yes Implantable device outside of the clinic excluding No Patient Alerts: Patient on Blood Thinner cellular tissue based products placed in the center left ABI 1.0; rt ABI 1.09 since last visit: Has Dressing in Place as Prescribed: Yes Has Compression in Place as Prescribed: Yes Pain Present Now: No Electronic Signature(s) Signed: 08/23/2020 4:38:34 PM By: Lorrin Jackson Entered By: Lorrin Jackson on 08/23/2020 10:15:01 -------------------------------------------------------------------------------- Encounter Discharge Information Details Patient Name: Date of Service: [name] 08/23/2020 9:30 A M Medical Record Number: [id number] Patient Account Number: [id number] Date of Birth/Sex: Treating RN: [birth year] (75 y.o. Male) Lorrin Jackson Primary Care Jakarri Lesko: [name], [name] Other Clinician: Referring Kacee Sukhu: Treating Aylla Huffine/Extender: [name], [name] [name], [name] Weeks in Treatment:  227 Encounter Discharge Information Items Post Procedure Vitals Discharge Condition: Stable Temperature (F): 98.4 Ambulatory Status: Wheelchair Pulse (bpm): 80 Discharge Destination: Home Respiratory Rate (breaths/min): 18 Transportation: Private Auto Blood Pressure (mmHg): 124/73 Schedule Follow-up Appointment: Yes Clinical Summary of Care: Provided on 08/23/2020 Form Type Recipient Paper Patient Patient Electronic Signature(s) Signed: 08/23/2020 12:09:59 PM By: Lorrin Jackson Previous Signature: 08/23/2020 10:51:56 AM Version By: Lorrin Jackson Entered By: Lorrin Jackson on 08/23/2020 12:09:59 -------------------------------------------------------------------------------- Lower Extremity Assessment Details Patient Name: Date of Service: [name] 08/23/2020 9:30 A M Medical Record Number: [id number] Patient Account Number: [id number] Date of Birth/Sex: Treating RN: [birth year] (75 y.o. Male) Lorrin Jackson Primary Care Elijah Michaelis: [name], [name] Other Clinician: Referring Keilynn Marano: Treating Lonie Rummell/Extender: [name], [name] [name], [name] Weeks in Treatment: 227 Edema Assessment Assessed: Shirlyn Goltz: Yes] [Right: Yes] Edema: [Left: Yes] [Right: Yes] Calf Left: Right: Point of Measurement: 36 cm From Medial Instep 28 cm 23 cm Ankle Left: Right: Point of Measurement: 11 cm From Medial Instep 21.5 cm 19.5 cm Vascular Assessment Pulses: Dorsalis Pedis Palpable: [Left:Yes] [Right:Yes] Electronic Signature(s) Signed: 08/23/2020 4:38:34 PM By: Lorrin Jackson Entered By: Lorrin Jackson on 08/23/2020 10:22:05 -------------------------------------------------------------------------------- Multi Wound Chart Details Patient Name: Date of Service: [name] 08/23/2020 9:30 A M Medical Record Number: [id number] Patient Account Number: [id number] Date of Birth/Sex: Treating RN: 11/12/1945 (75 y.o. Male) [name] Primary Care Shreya Lacasse: [name], [name] Other  Clinician: Referring Khyre Germond: Treating Timaya Bojarski/Extender: [name], [name] [name], [name] Weeks in Treatment: 227 Vital Signs Height(in): 74 Capillary Blood Glucose(mg/dl): 114 Weight(lbs): 150 Pulse(bpm): 80 Body Mass Index(BMI): 19 Blood Pressure(mmHg): 124/73 Temperature(F): 98.4 Respiratory Rate(breaths/min): 18 Photos: [14:No Photos Left, Lateral Lower Leg] [25:No Photos Right, Lateral Lower Leg] [26:No Photos Left, Dorsal Foot] Wound Location: [14:Gradually Appeared] [25:Gradually Appeared] [26:Gradually Appeared] Wounding Event: [14:Diabetic Wound/Ulcer of the Lower] [25:Venous Leg Ulcer] [26:Diabetic Wound/Ulcer of the Lower] Primary Etiology: [14:Extremity Anemia, Sleep Apnea, Arrhythmia,] [25:Anemia, Sleep Apnea, Arrhythmia,] [26:Extremity Anemia, Sleep  VERONICA, GILHOOLEY ([id number]) Visit Report for 08/23/2020 Arrival Information Details Patient Name: Date of Service: [name] [facility] F. 08/23/2020 9:30 A M Medical Record Number: [id number] Patient Account Number: [id number] Date of Birth/Sex: Treating RN: 1946/03/04 (75 y.o. Male) Lorrin Jackson Primary Care Onika Gudiel: [name], [name] Other Clinician: Referring Teagon Kron: Treating Tamiko Leopard/Extender: [name], [name] [name], [name] Weeks in Treatment: 43 Visit Information History Since Last Visit Added or deleted any medications: No Patient Arrived: Wheel Chair Any new allergies or adverse reactions: No Arrival Time: 10:13 Had a fall or experienced change in No Transfer Assistance: Manual activities of daily living that may affect Patient Identification Verified: Yes risk of falls: Secondary Verification Process Completed: Yes Signs or symptoms of abuse/neglect since last visito No Patient Requires Transmission-Based Precautions: No Hospitalized since last visit: No Patient Has Alerts: Yes Implantable device outside of the clinic excluding No Patient Alerts: Patient on Blood Thinner cellular tissue based products placed in the center left ABI 1.0; rt ABI 1.09 since last visit: Has Dressing in Place as Prescribed: Yes Has Compression in Place as Prescribed: Yes Pain Present Now: No Electronic Signature(s) Signed: 08/23/2020 4:38:34 PM By: Lorrin Jackson Entered By: Lorrin Jackson on 08/23/2020 10:15:01 -------------------------------------------------------------------------------- Encounter Discharge Information Details Patient Name: Date of Service: [name] 08/23/2020 9:30 A M Medical Record Number: [id number] Patient Account Number: [id number] Date of Birth/Sex: Treating RN: [birth year] (75 y.o. Male) Lorrin Jackson Primary Care Jakarri Lesko: [name], [name] Other Clinician: Referring Kacee Sukhu: Treating Aylla Huffine/Extender: [name], [name] [name], [name] Weeks in Treatment:  227 Encounter Discharge Information Items Post Procedure Vitals Discharge Condition: Stable Temperature (F): 98.4 Ambulatory Status: Wheelchair Pulse (bpm): 80 Discharge Destination: Home Respiratory Rate (breaths/min): 18 Transportation: Private Auto Blood Pressure (mmHg): 124/73 Schedule Follow-up Appointment: Yes Clinical Summary of Care: Provided on 08/23/2020 Form Type Recipient Paper Patient Patient Electronic Signature(s) Signed: 08/23/2020 12:09:59 PM By: Lorrin Jackson Previous Signature: 08/23/2020 10:51:56 AM Version By: Lorrin Jackson Entered By: Lorrin Jackson on 08/23/2020 12:09:59 -------------------------------------------------------------------------------- Lower Extremity Assessment Details Patient Name: Date of Service: [name] 08/23/2020 9:30 A M Medical Record Number: [id number] Patient Account Number: [id number] Date of Birth/Sex: Treating RN: [birth year] (75 y.o. Male) Lorrin Jackson Primary Care Elijah Michaelis: [name], [name] Other Clinician: Referring Keilynn Marano: Treating Lonie Rummell/Extender: [name], [name] [name], [name] Weeks in Treatment: 227 Edema Assessment Assessed: Shirlyn Goltz: Yes] [Right: Yes] Edema: [Left: Yes] [Right: Yes] Calf Left: Right: Point of Measurement: 36 cm From Medial Instep 28 cm 23 cm Ankle Left: Right: Point of Measurement: 11 cm From Medial Instep 21.5 cm 19.5 cm Vascular Assessment Pulses: Dorsalis Pedis Palpable: [Left:Yes] [Right:Yes] Electronic Signature(s) Signed: 08/23/2020 4:38:34 PM By: Lorrin Jackson Entered By: Lorrin Jackson on 08/23/2020 10:22:05 -------------------------------------------------------------------------------- Multi Wound Chart Details Patient Name: Date of Service: [name] 08/23/2020 9:30 A M Medical Record Number: [id number] Patient Account Number: [id number] Date of Birth/Sex: Treating RN: 11/12/1945 (75 y.o. Male) [name] Primary Care Shreya Lacasse: [name], [name] Other  Clinician: Referring Khyre Germond: Treating Timaya Bojarski/Extender: [name], [name] [name], [name] Weeks in Treatment: 227 Vital Signs Height(in): 74 Capillary Blood Glucose(mg/dl): 114 Weight(lbs): 150 Pulse(bpm): 80 Body Mass Index(BMI): 19 Blood Pressure(mmHg): 124/73 Temperature(F): 98.4 Respiratory Rate(breaths/min): 18 Photos: [14:No Photos Left, Lateral Lower Leg] [25:No Photos Right, Lateral Lower Leg] [26:No Photos Left, Dorsal Foot] Wound Location: [14:Gradually Appeared] [25:Gradually Appeared] [26:Gradually Appeared] Wounding Event: [14:Diabetic Wound/Ulcer of the Lower] [25:Venous Leg Ulcer] [26:Diabetic Wound/Ulcer of the Lower] Primary Etiology: [14:Extremity Anemia, Sleep Apnea, Arrhythmia,] [25:Anemia, Sleep Apnea, Arrhythmia,] [26:Extremity Anemia, Sleep  F. 08/23/2020 9:30 A M Medical Record Number: [id number] Patient Account Number: [id number] Date of Birth/Sex: Treating RN: May 04, 1945 (75 y.o. Male) [name] Primary Care Jessicalynn Deshong: [name], [name] Other Clinician: Referring Zyen Triggs: Treating Toribio Seiber/Extender: [name], [name] [name], [name] Weeks in Treatment: [place] reviewed with physician Active Inactive Wound/Skin Impairment Nursing Diagnoses: Impaired tissue integrity Goals: Patient/caregiver will verbalize understanding of skin care regimen Date Initiated: 11/19/2017 Target Resolution Date: 09/07/2020 Goal Status: Active Ulcer/skin breakdown will have a volume reduction of 30% by week 4 Date Initiated: 11/19/2017 Date Inactivated: 12/02/2017 Target Resolution Date:  12/22/2017 Goal Status: Unmet Unmet Reason: multipl comorbidities Interventions: Assess patient/caregiver ability to obtain necessary supplies Assess ulceration(s) every visit Provide education on ulcer and skin care Notes: Electronic Signature(s) Signed: 08/26/2020 3:14:58 PM By: [name] Entered By: [name] on 08/23/2020 10:53:23 -------------------------------------------------------------------------------- Pain Assessment Details Patient Name: Date of Service: [name] 08/23/2020 9:30 A M Medical Record Number: [id number] Patient Account Number: [id number] Date of Birth/Sex: Treating RN: [birth year] (75 y.o. Male) Lorrin Jackson Primary Care Johnnay Pleitez: [name], [name] Other Clinician: Referring Kelsei Defino: Treating Lyanne Kates/Extender: [name], [name] [name], [name] Weeks in Treatment: 227 Active Problems Location of Pain Severity and Description of Pain Patient Has Paino No Site Locations Pain Management and Medication Current Pain Management: Electronic Signature(s) Signed: 08/23/2020 4:38:34 PM By: Lorrin Jackson Entered By: Lorrin Jackson on 08/23/2020 10:15:46 -------------------------------------------------------------------------------- Patient/Caregiver Education Details Patient Name: Date of Service: [name] 5/3/2022andnbsp9:30 A M Medical Record Number: [id number] Patient Account Number: [id number] Date of Birth/Gender: Treating RN: May 20, 1945 (75 y.o. Male) [name] Primary Care Physician: [name], [name] Other Clinician: Referring Physician: Treating Physician/Extender: [name], [name] [name], [name] Weeks in Treatment: 25 Education Assessment Education Provided To: Patient Education Topics Provided Wound/Skin Impairment: Methods: Explain/Verbal Responses: State content correctly Electronic Signature(s) Signed: 08/26/2020 3:14:58 PM By: [name] Entered By: [name] on 08/23/2020  10:37:44 -------------------------------------------------------------------------------- Wound Assessment Details Patient Name: Date of Service: [name] 08/23/2020 9:30 A M Medical Record Number: [id number] Patient Account Number: [id number] Date of Birth/Sex: Treating RN: 10-26-1945 (75 y.o. Male) [name] Primary Care Yanelle Sousa: [name], [name] Other Clinician: Referring Laurajean Hosek: Treating Iris Tatsch/Extender: [name], [name] [name], [name] Weeks in Treatment: 227 Wound Status Wound Number: 14 Primary Diabetic Wound/Ulcer of the Lower Extremity Etiology: Wound Location: Left, Lateral Lower Leg Wound Open Wounding Event: Gradually Appeared Status: Date Acquired: 08/15/2017 Comorbid Anemia, Sleep Apnea, Arrhythmia, Congestive Heart Failure, Weeks Of Treatment: 157 History: Coronary Artery Disease, Hypertension, Myocardial Infarction, Clustered Wound: Yes Type II Diabetes, Gout, Neuropathy Photos Wound Measurements Length: (cm) 10.5 Width: (cm) 1.2 Depth: (cm) 0.2 Clustered Quantity: 1 Area: (cm) 9.896 Volume: (cm) 1.979 % Reduction in Area: -950.5% % Reduction in Volume: -2005.3% Epithelialization: Small (1-33%) Tunneling: No Undermining: No Wound Description Classification: Grade 2 Wound Margin: Distinct, outline attached Exudate Amount: Medium Exudate Type: Serosanguineous Exudate Color: red, brown Foul Odor After Cleansing: No Slough/Fibrino Yes Wound Bed Granulation Amount: Medium (34-66%) Exposed Structure Granulation Quality: Red Fascia Exposed: No Necrotic Amount: Medium (34-66%) Fat Layer (Subcutaneous Tissue) Exposed: Yes Necrotic Quality: Eschar, Adherent Slough Tendon Exposed: No Muscle Exposed: No Joint Exposed: No Bone Exposed: No Treatment Notes Wound #14 (Lower Leg) Wound Laterality: Left, Lateral Cleanser Wound Cleanser Discharge Instruction: Cleanse the wound with wound cleanser prior to applying a clean dressing using  gauze sponges, not tissue or cotton balls. Soap and Water Discharge Instruction: May shower and wash wound with dial antibacterial soap and water prior

## 2020-08-30 ENCOUNTER — Other Ambulatory Visit: Payer: Self-pay

## 2020-08-30 ENCOUNTER — Encounter (HOSPITAL_BASED_OUTPATIENT_CLINIC_OR_DEPARTMENT_OTHER): Payer: Medicare HMO | Admitting: Internal Medicine

## 2020-08-30 DIAGNOSIS — L97812 Non-pressure chronic ulcer of other part of right lower leg with fat layer exposed: Secondary | ICD-10-CM | POA: Diagnosis not present

## 2020-08-30 DIAGNOSIS — E1142 Type 2 diabetes mellitus with diabetic polyneuropathy: Secondary | ICD-10-CM | POA: Diagnosis not present

## 2020-08-30 DIAGNOSIS — E11622 Type 2 diabetes mellitus with other skin ulcer: Secondary | ICD-10-CM | POA: Diagnosis not present

## 2020-08-30 DIAGNOSIS — L97222 Non-pressure chronic ulcer of left calf with fat layer exposed: Secondary | ICD-10-CM | POA: Diagnosis not present

## 2020-08-30 DIAGNOSIS — E1169 Type 2 diabetes mellitus with other specified complication: Secondary | ICD-10-CM | POA: Diagnosis not present

## 2020-08-30 DIAGNOSIS — L97524 Non-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: Secondary | ICD-10-CM | POA: Diagnosis not present

## 2020-08-30 DIAGNOSIS — L97512 Non-pressure chronic ulcer of other part of right foot with fat layer exposed: Secondary | ICD-10-CM | POA: Diagnosis not present

## 2020-08-30 DIAGNOSIS — E11621 Type 2 diabetes mellitus with foot ulcer: Secondary | ICD-10-CM | POA: Diagnosis not present

## 2020-08-30 DIAGNOSIS — L97522 Non-pressure chronic ulcer of other part of left foot with fat layer exposed: Secondary | ICD-10-CM | POA: Diagnosis not present

## 2020-08-30 NOTE — Progress Notes (Signed)
Sween Lotion (Moisturizing lotion) Every Other Day/15 Days Discharge Instructions: Apply moisturizing lotion as directed Prim Dressing: KerraCel Ag Gelling Fiber Dressing, 4x5 in (silver alginate) (Generic) Every Other Day/15 Days ary Discharge Instructions: Apply silver alginate to wound bed as instructed Secondary Dressing: Woven Gauze Sponge, Non-Sterile 4x4 in (Generic) Every Other Day/15 Days Discharge Instructions: Apply over primary dressing as directed. Secondary Dressing: ABD Pad, 5x9 (Generic) Every Other Day/15 Days Discharge Instructions: Apply over primary dressing as directed. Com pression Wrap: Kerlix Roll 4.5x3.1 (in/yd) (Generic) Every Other Day/15 Days Discharge Instructions: Apply Kerlix and Coban compression as directed. Com pression Wrap: Coban Self-Adherent Wrap 4x5 (in/yd) (Generic) Every Other Day/15 Days Discharge Instructions: Apply over Kerlix as directed. #1 the left foot does not look good. General erythema on the dorsal foot. Necrotic wounds over the dorsal ankle and left lateral fifth met head. His CT scan showed osteomyelitis I took a specimen of the bone this showed MRSA and Enterococcus. I am going to send him to see infectious disease however I was not going to delay antibiotics given the condition of the foot  in general 2. The left lateral tibial wound which was his original wound in this clinic for a long period of time actually looks smaller it is getting narrower. 3. The patient has moderate macrovascular disease but is not amenable to revascularization for vein and vascular. This may have a large amount to do with things here. Electronic Signature(s) Signed: 08/30/2020 4:50:17 PM By: Linton Ham MD Entered By: Linton Ham on 08/30/2020 11:05:21 -------------------------------------------------------------------------------- SuperBill Details Patient Name: Date of Service: [name] Foley F. 08/30/2020 Medical Record Number: [id number] Patient Account Number: [id number] Date of Birth/Sex: Treating RN: 06-Jun-1945 (75 y.o. [name] [name] Primary Care Provider: [name], [name] Other Clinician: Referring Provider: Treating Provider/Extender: Kazuto Sevey [name], [name] Weeks in Treatment: 228 Diagnosis Coding ICD-10 Codes Code Description E11.621 Type 2 diabetes mellitus with foot ulcer L97.518 Non-pressure chronic ulcer of other part of right foot with other specified severity L97.221 Non-pressure chronic ulcer of left calf limited to breakdown of skin L97.321 Non-pressure chronic ulcer of left ankle limited to breakdown of skin L97.521 Non-pressure chronic ulcer of other part of left foot limited to breakdown of skin L97.524 Non-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 M86.172 Other acute osteomyelitis, left ankle and foot Facility Procedures CPT4 Code: 10034961 Description: 11043 - DEB MUSC/FASCIA 20 SQ CM/< ICD-10 Diagnosis Description L97.524 Non-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 Modifier: Quantity: 1 Physician Procedures : CPT4 Code Description Modifier [id number] 11043 - WC PHYS DEBR MUSCLE/FASCIA 20 SQ CM ICD-10 Diagnosis Description L97.524 Non-pressure chronic ulcer of other part of left foot with  necrosis of bone Quantity: 1 Electronic Signature(s) Signed: 08/30/2020 4:50:17 PM By: Linton Ham MD Entered By: Linton Ham on 08/30/2020 11:05:42  or cotton balls. Cleanser: Soap and Water Every Other Day/15 Days Discharge Instructions: May shower and wash wound with dial antibacterial soap and water prior to dressing change. Peri-Wound Care: Triamcinolone 15 (g) Every Other Day/15 Days Discharge Instructions: Use triamcinolone 15 (g) as directed Peri-Wound Care: Sween Lotion (Moisturizing lotion) Every Other Day/15 Days Discharge Instructions: Apply moisturizing lotion as directed Prim Dressing: KerraCel Ag Gelling Fiber Dressing, 4x5 in (silver alginate) (Generic) Every Other Day/15 Days ary Discharge Instructions: Apply silver alginate to wound bed as instructed Secondary Dressing: Woven Gauze Sponge, Non-Sterile 4x4 in (Generic) Every Other Day/15 Days Discharge Instructions: Apply over primary dressing as directed. Secondary Dressing: ABD Pad, 5x9 (Generic) Every Other Day/15 Days Discharge Instructions: Apply over primary dressing as directed. Compression Wrap: Kerlix Roll 4.5x3.1 (in/yd) (Generic) Every Other Day/15 Days Discharge Instructions: Apply Kerlix and Coban compression as directed. Compression Wrap: Coban Self-Adherent Wrap 4x5 (in/yd) (Generic) Every Other Day/15 Days Discharge Instructions: Apply over Kerlix as directed. Wound #36 - Foot Wound Laterality: Left, Lateral Cleanser: Wound Cleanser (Generic) Every Other Day/15 Days Discharge Instructions: Cleanse the wound with wound cleanser prior to applying a clean dressing using gauze sponges, not tissue or cotton balls. Cleanser: Soap and Water Every Other Day/15 Days Discharge Instructions: May shower and wash wound with dial antibacterial soap and water prior to dressing change. Peri-Wound Care: Triamcinolone 15 (g) Every Other Day/15 Days Discharge Instructions:  Use triamcinolone 15 (g) as directed Peri-Wound Care: Sween Lotion (Moisturizing lotion) Every Other Day/15 Days Discharge Instructions: Apply moisturizing lotion as directed Prim Dressing: KerraCel Ag Gelling Fiber Dressing, 4x5 in (silver alginate) (Generic) Every Other Day/15 Days ary Discharge Instructions: Apply silver alginate to wound bed as instructed Secondary Dressing: Woven Gauze Sponge, Non-Sterile 4x4 in (Generic) Every Other Day/15 Days Discharge Instructions: Apply over primary dressing as directed. Secondary Dressing: ABD Pad, 5x9 (Generic) Every Other Day/15 Days Discharge Instructions: Apply over primary dressing as directed. Compression Wrap: Kerlix Roll 4.5x3.1 (in/yd) (Generic) Every Other Day/15 Days Discharge Instructions: Apply Kerlix and Coban compression as directed. Compression Wrap: Coban Self-Adherent Wrap 4x5 (in/yd) (Generic) Every Other Day/15 Days Discharge Instructions: Apply over Kerlix as directed. 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 - (ICD10 E11.621 - Type 2 diabetes mellitus with foot ulcer) Electronic Signature(s) Signed: 08/30/2020 4:50:17 PM By: Linton Ham MD Signed: 08/30/2020 6:10:24 PM By: Deon Pilling Entered By: Deon Pilling on 08/30/2020 10:48:49 Prescription 08/30/2020 -------------------------------------------------------------------------------- [name] MD Patient Name: Provider: March 16, 1946 [id number] Date of Birth: NPI#: [name] [id number] Sex: DEA #: [phone] Phone #: License #: [place] Patient Address: [address] [place], [place] [address] [place], [place] [address] [phone] Allergies Sulfa Provider's Orders Infectious Disease - ICD10: E11.621 - refer [facility] Infectious Disease related to osteomyelitis to left  foot along with positive PCR culture. Hand Signature: Date(s): Electronic Signature(s) Signed: 08/30/2020 4:50:17 PM By: Linton Ham MD Signed: 08/30/2020 6:10:24 PM By: Deon Pilling Entered By: Deon Pilling on 08/30/2020 10:48:54 -------------------------------------------------------------------------------- Problem List Details Patient Name: Date of Service: [name] Foley F. 08/30/2020 9:45 A M Medical Record Number: [id number] Patient Account Number: [id number] Date of Birth/Sex: Treating RN: November 09, 1945 (75 y.o. [name] [name] Primary Care Provider: [name], [name] Other Clinician: Referring Provider: Treating Provider/Extender: Andelyn Spade [name], [name] Weeks in Treatment: 228 Active Problems ICD-10 Encounter Code Description Active Date MDM  for 2-week follow-up. He has no complaints today. He finished his course of antibiotics scheduled at last visit for a culture that grew moderate strep group B. He has no new wounds today. His daughter continues to do his dressings 5/3; 2-week follow-up. Since he was last here he had a CT scan of his left foot that suggested osteomyelitis over the fifth MTP joint which is unfortunately what we expected to see. His daughter had called in late last week to say there was an odor  and exposed bone. I did not appreciate an odor and there was no exposed bone but there certainly was a necrotic surface. He also has areas on the left dorsal ankle right dorsal ankle left lateral lower leg He also saw Dr. Loletta Specter on 4/26. 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 He had a CO2 angiogram mostly had a brisk peroneal signal. It was felt that he would only have an option of an amputation from vascular surgery point of view. The patient and his daughter are very opposed to this although he is only minimally ambulatory 5/10; PCR culture of the left fifth met head bone showed predominantly MRSA and Enterococcus. I started him on Augmentin I am going to try to get linezolid through his insurance. He is allergic to sulfa and I believe the MRSA had tetracycline resistance genes. I am also going to get him seen by infectious disease. The left foot is particularly bad he has a necrotic wound on the left dorsal ankle with exposed tendon hyper granulated area below this and the necrotic area on the left fifth met head which was his most recent wound and then the original area on the left lateral lower leg which gradually, paradoxically is getting better. Electronic Signature(s) Signed: 08/30/2020 4:50:17 PM By: Linton Ham MD Entered By: Linton Ham on 08/30/2020 11:02:02 -------------------------------------------------------------------------------- Physical Exam Details Patient Name: Date of Service: [name] Foley F. 08/30/2020 9:45 A M Medical Record Number: [id number] Patient Account Number: [id number] Date of Birth/Sex: Treating RN: 06/08/1945 (75 y.o. [name] [name] Primary Care Provider: [name], [name] Other Clinician: Referring Provider: Treating Provider/Extender: Tomicka Lover [name], [name] Weeks in Treatment: 228 Constitutional Sitting or standing Blood Pressure is within target range for patient.. Pulse regular and within target range for  patient.Marland Kitchen Respirations regular, non-labored and within target range.. Temperature is normal and within the target range for the patient.Marland Kitchen Appears in no distress. Notes Wound exam Right lower extremity dorsal foot does not look too bad. Right dorsal foot and right lateral lower leg Much worse is the left. Necrotic wound over the lateral part of the fifth met head. Necrotic wound with exposed tendon in the dorsal ankle smaller area just below this which is hyper granulated I used a #15 scalpel and pickups to remove the tendon and debris from the surface of the skin Electronic Signature(s) Signed: 08/30/2020 4:50:17 PM By: Linton Ham MD Entered By: Linton Ham on 08/30/2020 11:03:27 -------------------------------------------------------------------------------- Physician Orders Details Patient Name: Date of Service: [name] Foley F. 08/30/2020 9:45 A M Medical Record Number: [id number] Patient Account Number: [id number] Date of Birth/Sex: Treating RN: [birth year] (75 y.o. [name] [name] Primary Care Provider: [name], [name] Other Clinician: Referring Provider: Treating Provider/Extender: Alaiza Yau [name], [name] Weeks in Treatment: 228 Verbal / Phone Orders: No Diagnosis Coding ICD-10 Coding Code Description E11.621 Type 2 diabetes mellitus with foot ulcer L97.518 Non-pressure chronic ulcer of other part of right foot with other specified  or cotton balls. Cleanser: Soap and Water Every Other Day/15 Days Discharge Instructions: May shower and wash wound with dial antibacterial soap and water prior to dressing change. Peri-Wound Care: Triamcinolone 15 (g) Every Other Day/15 Days Discharge Instructions: Use triamcinolone 15 (g) as directed Peri-Wound Care: Sween Lotion (Moisturizing lotion) Every Other Day/15 Days Discharge Instructions: Apply moisturizing lotion as directed Prim Dressing: KerraCel Ag Gelling Fiber Dressing, 4x5 in (silver alginate) (Generic) Every Other Day/15 Days ary Discharge Instructions: Apply silver alginate to wound bed as instructed Secondary Dressing: Woven Gauze Sponge, Non-Sterile 4x4 in (Generic) Every Other Day/15 Days Discharge Instructions: Apply over primary dressing as directed. Secondary Dressing: ABD Pad, 5x9 (Generic) Every Other Day/15 Days Discharge Instructions: Apply over primary dressing as directed. Compression Wrap: Kerlix Roll 4.5x3.1 (in/yd) (Generic) Every Other Day/15 Days Discharge Instructions: Apply Kerlix and Coban compression as directed. Compression Wrap: Coban Self-Adherent Wrap 4x5 (in/yd) (Generic) Every Other Day/15 Days Discharge Instructions: Apply over Kerlix as directed. Wound #36 - Foot Wound Laterality: Left, Lateral Cleanser: Wound Cleanser (Generic) Every Other Day/15 Days Discharge Instructions: Cleanse the wound with wound cleanser prior to applying a clean dressing using gauze sponges, not tissue or cotton balls. Cleanser: Soap and Water Every Other Day/15 Days Discharge Instructions: May shower and wash wound with dial antibacterial soap and water prior to dressing change. Peri-Wound Care: Triamcinolone 15 (g) Every Other Day/15 Days Discharge Instructions:  Use triamcinolone 15 (g) as directed Peri-Wound Care: Sween Lotion (Moisturizing lotion) Every Other Day/15 Days Discharge Instructions: Apply moisturizing lotion as directed Prim Dressing: KerraCel Ag Gelling Fiber Dressing, 4x5 in (silver alginate) (Generic) Every Other Day/15 Days ary Discharge Instructions: Apply silver alginate to wound bed as instructed Secondary Dressing: Woven Gauze Sponge, Non-Sterile 4x4 in (Generic) Every Other Day/15 Days Discharge Instructions: Apply over primary dressing as directed. Secondary Dressing: ABD Pad, 5x9 (Generic) Every Other Day/15 Days Discharge Instructions: Apply over primary dressing as directed. Compression Wrap: Kerlix Roll 4.5x3.1 (in/yd) (Generic) Every Other Day/15 Days Discharge Instructions: Apply Kerlix and Coban compression as directed. Compression Wrap: Coban Self-Adherent Wrap 4x5 (in/yd) (Generic) Every Other Day/15 Days Discharge Instructions: Apply over Kerlix as directed. 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 - (ICD10 E11.621 - Type 2 diabetes mellitus with foot ulcer) Electronic Signature(s) Signed: 08/30/2020 4:50:17 PM By: Linton Ham MD Signed: 08/30/2020 6:10:24 PM By: Deon Pilling Entered By: Deon Pilling on 08/30/2020 10:48:49 Prescription 08/30/2020 -------------------------------------------------------------------------------- [name] MD Patient Name: Provider: March 16, 1946 [id number] Date of Birth: NPI#: [name] [id number] Sex: DEA #: [phone] Phone #: License #: [place] Patient Address: [address] [place], [place] [address] [place], [place] [address] [phone] Allergies Sulfa Provider's Orders Infectious Disease - ICD10: E11.621 - refer [facility] Infectious Disease related to osteomyelitis to left  foot along with positive PCR culture. Hand Signature: Date(s): Electronic Signature(s) Signed: 08/30/2020 4:50:17 PM By: Linton Ham MD Signed: 08/30/2020 6:10:24 PM By: Deon Pilling Entered By: Deon Pilling on 08/30/2020 10:48:54 -------------------------------------------------------------------------------- Problem List Details Patient Name: Date of Service: [name] Foley F. 08/30/2020 9:45 A M Medical Record Number: [id number] Patient Account Number: [id number] Date of Birth/Sex: Treating RN: November 09, 1945 (75 y.o. [name] [name] Primary Care Provider: [name], [name] Other Clinician: Referring Provider: Treating Provider/Extender: Andelyn Spade [name], [name] Weeks in Treatment: 228 Active Problems ICD-10 Encounter Code Description Active Date MDM  or cotton balls. Cleanser: Soap and Water Every Other Day/15 Days Discharge Instructions: May shower and wash wound with dial antibacterial soap and water prior to dressing change. Peri-Wound Care: Triamcinolone 15 (g) Every Other Day/15 Days Discharge Instructions: Use triamcinolone 15 (g) as directed Peri-Wound Care: Sween Lotion (Moisturizing lotion) Every Other Day/15 Days Discharge Instructions: Apply moisturizing lotion as directed Prim Dressing: KerraCel Ag Gelling Fiber Dressing, 4x5 in (silver alginate) (Generic) Every Other Day/15 Days ary Discharge Instructions: Apply silver alginate to wound bed as instructed Secondary Dressing: Woven Gauze Sponge, Non-Sterile 4x4 in (Generic) Every Other Day/15 Days Discharge Instructions: Apply over primary dressing as directed. Secondary Dressing: ABD Pad, 5x9 (Generic) Every Other Day/15 Days Discharge Instructions: Apply over primary dressing as directed. Compression Wrap: Kerlix Roll 4.5x3.1 (in/yd) (Generic) Every Other Day/15 Days Discharge Instructions: Apply Kerlix and Coban compression as directed. Compression Wrap: Coban Self-Adherent Wrap 4x5 (in/yd) (Generic) Every Other Day/15 Days Discharge Instructions: Apply over Kerlix as directed. Wound #36 - Foot Wound Laterality: Left, Lateral Cleanser: Wound Cleanser (Generic) Every Other Day/15 Days Discharge Instructions: Cleanse the wound with wound cleanser prior to applying a clean dressing using gauze sponges, not tissue or cotton balls. Cleanser: Soap and Water Every Other Day/15 Days Discharge Instructions: May shower and wash wound with dial antibacterial soap and water prior to dressing change. Peri-Wound Care: Triamcinolone 15 (g) Every Other Day/15 Days Discharge Instructions:  Use triamcinolone 15 (g) as directed Peri-Wound Care: Sween Lotion (Moisturizing lotion) Every Other Day/15 Days Discharge Instructions: Apply moisturizing lotion as directed Prim Dressing: KerraCel Ag Gelling Fiber Dressing, 4x5 in (silver alginate) (Generic) Every Other Day/15 Days ary Discharge Instructions: Apply silver alginate to wound bed as instructed Secondary Dressing: Woven Gauze Sponge, Non-Sterile 4x4 in (Generic) Every Other Day/15 Days Discharge Instructions: Apply over primary dressing as directed. Secondary Dressing: ABD Pad, 5x9 (Generic) Every Other Day/15 Days Discharge Instructions: Apply over primary dressing as directed. Compression Wrap: Kerlix Roll 4.5x3.1 (in/yd) (Generic) Every Other Day/15 Days Discharge Instructions: Apply Kerlix and Coban compression as directed. Compression Wrap: Coban Self-Adherent Wrap 4x5 (in/yd) (Generic) Every Other Day/15 Days Discharge Instructions: Apply over Kerlix as directed. 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 - (ICD10 E11.621 - Type 2 diabetes mellitus with foot ulcer) Electronic Signature(s) Signed: 08/30/2020 4:50:17 PM By: Linton Ham MD Signed: 08/30/2020 6:10:24 PM By: Deon Pilling Entered By: Deon Pilling on 08/30/2020 10:48:49 Prescription 08/30/2020 -------------------------------------------------------------------------------- [name] MD Patient Name: Provider: March 16, 1946 [id number] Date of Birth: NPI#: [name] [id number] Sex: DEA #: [phone] Phone #: License #: [place] Patient Address: [address] [place], [place] [address] [place], [place] [address] [phone] Allergies Sulfa Provider's Orders Infectious Disease - ICD10: E11.621 - refer [facility] Infectious Disease related to osteomyelitis to left  foot along with positive PCR culture. Hand Signature: Date(s): Electronic Signature(s) Signed: 08/30/2020 4:50:17 PM By: Linton Ham MD Signed: 08/30/2020 6:10:24 PM By: Deon Pilling Entered By: Deon Pilling on 08/30/2020 10:48:54 -------------------------------------------------------------------------------- Problem List Details Patient Name: Date of Service: [name] Foley F. 08/30/2020 9:45 A M Medical Record Number: [id number] Patient Account Number: [id number] Date of Birth/Sex: Treating RN: November 09, 1945 (75 y.o. [name] [name] Primary Care Provider: [name], [name] Other Clinician: Referring Provider: Treating Provider/Extender: Andelyn Spade [name], [name] Weeks in Treatment: 228 Active Problems ICD-10 Encounter Code Description Active Date MDM  or cotton balls. Cleanser: Soap and Water Every Other Day/15 Days Discharge Instructions: May shower and wash wound with dial antibacterial soap and water prior to dressing change. Peri-Wound Care: Triamcinolone 15 (g) Every Other Day/15 Days Discharge Instructions: Use triamcinolone 15 (g) as directed Peri-Wound Care: Sween Lotion (Moisturizing lotion) Every Other Day/15 Days Discharge Instructions: Apply moisturizing lotion as directed Prim Dressing: KerraCel Ag Gelling Fiber Dressing, 4x5 in (silver alginate) (Generic) Every Other Day/15 Days ary Discharge Instructions: Apply silver alginate to wound bed as instructed Secondary Dressing: Woven Gauze Sponge, Non-Sterile 4x4 in (Generic) Every Other Day/15 Days Discharge Instructions: Apply over primary dressing as directed. Secondary Dressing: ABD Pad, 5x9 (Generic) Every Other Day/15 Days Discharge Instructions: Apply over primary dressing as directed. Compression Wrap: Kerlix Roll 4.5x3.1 (in/yd) (Generic) Every Other Day/15 Days Discharge Instructions: Apply Kerlix and Coban compression as directed. Compression Wrap: Coban Self-Adherent Wrap 4x5 (in/yd) (Generic) Every Other Day/15 Days Discharge Instructions: Apply over Kerlix as directed. Wound #36 - Foot Wound Laterality: Left, Lateral Cleanser: Wound Cleanser (Generic) Every Other Day/15 Days Discharge Instructions: Cleanse the wound with wound cleanser prior to applying a clean dressing using gauze sponges, not tissue or cotton balls. Cleanser: Soap and Water Every Other Day/15 Days Discharge Instructions: May shower and wash wound with dial antibacterial soap and water prior to dressing change. Peri-Wound Care: Triamcinolone 15 (g) Every Other Day/15 Days Discharge Instructions:  Use triamcinolone 15 (g) as directed Peri-Wound Care: Sween Lotion (Moisturizing lotion) Every Other Day/15 Days Discharge Instructions: Apply moisturizing lotion as directed Prim Dressing: KerraCel Ag Gelling Fiber Dressing, 4x5 in (silver alginate) (Generic) Every Other Day/15 Days ary Discharge Instructions: Apply silver alginate to wound bed as instructed Secondary Dressing: Woven Gauze Sponge, Non-Sterile 4x4 in (Generic) Every Other Day/15 Days Discharge Instructions: Apply over primary dressing as directed. Secondary Dressing: ABD Pad, 5x9 (Generic) Every Other Day/15 Days Discharge Instructions: Apply over primary dressing as directed. Compression Wrap: Kerlix Roll 4.5x3.1 (in/yd) (Generic) Every Other Day/15 Days Discharge Instructions: Apply Kerlix and Coban compression as directed. Compression Wrap: Coban Self-Adherent Wrap 4x5 (in/yd) (Generic) Every Other Day/15 Days Discharge Instructions: Apply over Kerlix as directed. 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 - (ICD10 E11.621 - Type 2 diabetes mellitus with foot ulcer) Electronic Signature(s) Signed: 08/30/2020 4:50:17 PM By: Linton Ham MD Signed: 08/30/2020 6:10:24 PM By: Deon Pilling Entered By: Deon Pilling on 08/30/2020 10:48:49 Prescription 08/30/2020 -------------------------------------------------------------------------------- [name] MD Patient Name: Provider: March 16, 1946 [id number] Date of Birth: NPI#: [name] [id number] Sex: DEA #: [phone] Phone #: License #: [place] Patient Address: [address] [place], [place] [address] [place], [place] [address] [phone] Allergies Sulfa Provider's Orders Infectious Disease - ICD10: E11.621 - refer [facility] Infectious Disease related to osteomyelitis to left  foot along with positive PCR culture. Hand Signature: Date(s): Electronic Signature(s) Signed: 08/30/2020 4:50:17 PM By: Linton Ham MD Signed: 08/30/2020 6:10:24 PM By: Deon Pilling Entered By: Deon Pilling on 08/30/2020 10:48:54 -------------------------------------------------------------------------------- Problem List Details Patient Name: Date of Service: [name] Foley F. 08/30/2020 9:45 A M Medical Record Number: [id number] Patient Account Number: [id number] Date of Birth/Sex: Treating RN: November 09, 1945 (75 y.o. [name] [name] Primary Care Provider: [name], [name] Other Clinician: Referring Provider: Treating Provider/Extender: Andelyn Spade [name], [name] Weeks in Treatment: 228 Active Problems ICD-10 Encounter Code Description Active Date MDM  Sween Lotion (Moisturizing lotion) Every Other Day/15 Days Discharge Instructions: Apply moisturizing lotion as directed Prim Dressing: KerraCel Ag Gelling Fiber Dressing, 4x5 in (silver alginate) (Generic) Every Other Day/15 Days ary Discharge Instructions: Apply silver alginate to wound bed as instructed Secondary Dressing: Woven Gauze Sponge, Non-Sterile 4x4 in (Generic) Every Other Day/15 Days Discharge Instructions: Apply over primary dressing as directed. Secondary Dressing: ABD Pad, 5x9 (Generic) Every Other Day/15 Days Discharge Instructions: Apply over primary dressing as directed. Com pression Wrap: Kerlix Roll 4.5x3.1 (in/yd) (Generic) Every Other Day/15 Days Discharge Instructions: Apply Kerlix and Coban compression as directed. Com pression Wrap: Coban Self-Adherent Wrap 4x5 (in/yd) (Generic) Every Other Day/15 Days Discharge Instructions: Apply over Kerlix as directed. #1 the left foot does not look good. General erythema on the dorsal foot. Necrotic wounds over the dorsal ankle and left lateral fifth met head. His CT scan showed osteomyelitis I took a specimen of the bone this showed MRSA and Enterococcus. I am going to send him to see infectious disease however I was not going to delay antibiotics given the condition of the foot  in general 2. The left lateral tibial wound which was his original wound in this clinic for a long period of time actually looks smaller it is getting narrower. 3. The patient has moderate macrovascular disease but is not amenable to revascularization for vein and vascular. This may have a large amount to do with things here. Electronic Signature(s) Signed: 08/30/2020 4:50:17 PM By: Linton Ham MD Entered By: Linton Ham on 08/30/2020 11:05:21 -------------------------------------------------------------------------------- SuperBill Details Patient Name: Date of Service: [name] Foley F. 08/30/2020 Medical Record Number: [id number] Patient Account Number: [id number] Date of Birth/Sex: Treating RN: 06-Jun-1945 (75 y.o. [name] [name] Primary Care Provider: [name], [name] Other Clinician: Referring Provider: Treating Provider/Extender: Kazuto Sevey [name], [name] Weeks in Treatment: 228 Diagnosis Coding ICD-10 Codes Code Description E11.621 Type 2 diabetes mellitus with foot ulcer L97.518 Non-pressure chronic ulcer of other part of right foot with other specified severity L97.221 Non-pressure chronic ulcer of left calf limited to breakdown of skin L97.321 Non-pressure chronic ulcer of left ankle limited to breakdown of skin L97.521 Non-pressure chronic ulcer of other part of left foot limited to breakdown of skin L97.524 Non-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 M86.172 Other acute osteomyelitis, left ankle and foot Facility Procedures CPT4 Code: 10034961 Description: 11043 - DEB MUSC/FASCIA 20 SQ CM/< ICD-10 Diagnosis Description L97.524 Non-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 Modifier: Quantity: 1 Physician Procedures : CPT4 Code Description Modifier [id number] 11043 - WC PHYS DEBR MUSCLE/FASCIA 20 SQ CM ICD-10 Diagnosis Description L97.524 Non-pressure chronic ulcer of other part of left foot with  necrosis of bone Quantity: 1 Electronic Signature(s) Signed: 08/30/2020 4:50:17 PM By: Linton Ham MD Entered By: Linton Ham on 08/30/2020 11:05:42  or cotton balls. Cleanser: Soap and Water Every Other Day/15 Days Discharge Instructions: May shower and wash wound with dial antibacterial soap and water prior to dressing change. Peri-Wound Care: Triamcinolone 15 (g) Every Other Day/15 Days Discharge Instructions: Use triamcinolone 15 (g) as directed Peri-Wound Care: Sween Lotion (Moisturizing lotion) Every Other Day/15 Days Discharge Instructions: Apply moisturizing lotion as directed Prim Dressing: KerraCel Ag Gelling Fiber Dressing, 4x5 in (silver alginate) (Generic) Every Other Day/15 Days ary Discharge Instructions: Apply silver alginate to wound bed as instructed Secondary Dressing: Woven Gauze Sponge, Non-Sterile 4x4 in (Generic) Every Other Day/15 Days Discharge Instructions: Apply over primary dressing as directed. Secondary Dressing: ABD Pad, 5x9 (Generic) Every Other Day/15 Days Discharge Instructions: Apply over primary dressing as directed. Compression Wrap: Kerlix Roll 4.5x3.1 (in/yd) (Generic) Every Other Day/15 Days Discharge Instructions: Apply Kerlix and Coban compression as directed. Compression Wrap: Coban Self-Adherent Wrap 4x5 (in/yd) (Generic) Every Other Day/15 Days Discharge Instructions: Apply over Kerlix as directed. Wound #36 - Foot Wound Laterality: Left, Lateral Cleanser: Wound Cleanser (Generic) Every Other Day/15 Days Discharge Instructions: Cleanse the wound with wound cleanser prior to applying a clean dressing using gauze sponges, not tissue or cotton balls. Cleanser: Soap and Water Every Other Day/15 Days Discharge Instructions: May shower and wash wound with dial antibacterial soap and water prior to dressing change. Peri-Wound Care: Triamcinolone 15 (g) Every Other Day/15 Days Discharge Instructions:  Use triamcinolone 15 (g) as directed Peri-Wound Care: Sween Lotion (Moisturizing lotion) Every Other Day/15 Days Discharge Instructions: Apply moisturizing lotion as directed Prim Dressing: KerraCel Ag Gelling Fiber Dressing, 4x5 in (silver alginate) (Generic) Every Other Day/15 Days ary Discharge Instructions: Apply silver alginate to wound bed as instructed Secondary Dressing: Woven Gauze Sponge, Non-Sterile 4x4 in (Generic) Every Other Day/15 Days Discharge Instructions: Apply over primary dressing as directed. Secondary Dressing: ABD Pad, 5x9 (Generic) Every Other Day/15 Days Discharge Instructions: Apply over primary dressing as directed. Compression Wrap: Kerlix Roll 4.5x3.1 (in/yd) (Generic) Every Other Day/15 Days Discharge Instructions: Apply Kerlix and Coban compression as directed. Compression Wrap: Coban Self-Adherent Wrap 4x5 (in/yd) (Generic) Every Other Day/15 Days Discharge Instructions: Apply over Kerlix as directed. 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 - (ICD10 E11.621 - Type 2 diabetes mellitus with foot ulcer) Electronic Signature(s) Signed: 08/30/2020 4:50:17 PM By: Linton Ham MD Signed: 08/30/2020 6:10:24 PM By: Deon Pilling Entered By: Deon Pilling on 08/30/2020 10:48:49 Prescription 08/30/2020 -------------------------------------------------------------------------------- [name] MD Patient Name: Provider: March 16, 1946 [id number] Date of Birth: NPI#: [name] [id number] Sex: DEA #: [phone] [id number] Phone #: License #: [place] Patient Address: [address] [place], [place] [address] [place], [place] [address] [phone] Allergies Sulfa Provider's Orders Infectious Disease - ICD10: E11.621 - refer [facility] Infectious Disease related to osteomyelitis to left  foot along with positive PCR culture. Hand Signature: Date(s): Electronic Signature(s) Signed: 08/30/2020 4:50:17 PM By: Linton Ham MD Signed: 08/30/2020 6:10:24 PM By: Deon Pilling Entered By: Deon Pilling on 08/30/2020 10:48:54 -------------------------------------------------------------------------------- Problem List Details Patient Name: Date of Service: [name] Foley F. 08/30/2020 9:45 A M Medical Record Number: [id number] Patient Account Number: [id number] Date of Birth/Sex: Treating RN: November 09, 1945 (75 y.o. [name] [name] Primary Care Provider: [name], [name] Other Clinician: Referring Provider: Treating Provider/Extender: Andelyn Spade [name], [name] Weeks in Treatment: 228 Active Problems ICD-10 Encounter Code Description Active Date MDM  or cotton balls. Cleanser: Soap and Water Every Other Day/15 Days Discharge Instructions: May shower and wash wound with dial antibacterial soap and water prior to dressing change. Peri-Wound Care: Triamcinolone 15 (g) Every Other Day/15 Days Discharge Instructions: Use triamcinolone 15 (g) as directed Peri-Wound Care: Sween Lotion (Moisturizing lotion) Every Other Day/15 Days Discharge Instructions: Apply moisturizing lotion as directed Prim Dressing: KerraCel Ag Gelling Fiber Dressing, 4x5 in (silver alginate) (Generic) Every Other Day/15 Days ary Discharge Instructions: Apply silver alginate to wound bed as instructed Secondary Dressing: Woven Gauze Sponge, Non-Sterile 4x4 in (Generic) Every Other Day/15 Days Discharge Instructions: Apply over primary dressing as directed. Secondary Dressing: ABD Pad, 5x9 (Generic) Every Other Day/15 Days Discharge Instructions: Apply over primary dressing as directed. Compression Wrap: Kerlix Roll 4.5x3.1 (in/yd) (Generic) Every Other Day/15 Days Discharge Instructions: Apply Kerlix and Coban compression as directed. Compression Wrap: Coban Self-Adherent Wrap 4x5 (in/yd) (Generic) Every Other Day/15 Days Discharge Instructions: Apply over Kerlix as directed. Wound #36 - Foot Wound Laterality: Left, Lateral Cleanser: Wound Cleanser (Generic) Every Other Day/15 Days Discharge Instructions: Cleanse the wound with wound cleanser prior to applying a clean dressing using gauze sponges, not tissue or cotton balls. Cleanser: Soap and Water Every Other Day/15 Days Discharge Instructions: May shower and wash wound with dial antibacterial soap and water prior to dressing change. Peri-Wound Care: Triamcinolone 15 (g) Every Other Day/15 Days Discharge Instructions:  Use triamcinolone 15 (g) as directed Peri-Wound Care: Sween Lotion (Moisturizing lotion) Every Other Day/15 Days Discharge Instructions: Apply moisturizing lotion as directed Prim Dressing: KerraCel Ag Gelling Fiber Dressing, 4x5 in (silver alginate) (Generic) Every Other Day/15 Days ary Discharge Instructions: Apply silver alginate to wound bed as instructed Secondary Dressing: Woven Gauze Sponge, Non-Sterile 4x4 in (Generic) Every Other Day/15 Days Discharge Instructions: Apply over primary dressing as directed. Secondary Dressing: ABD Pad, 5x9 (Generic) Every Other Day/15 Days Discharge Instructions: Apply over primary dressing as directed. Compression Wrap: Kerlix Roll 4.5x3.1 (in/yd) (Generic) Every Other Day/15 Days Discharge Instructions: Apply Kerlix and Coban compression as directed. Compression Wrap: Coban Self-Adherent Wrap 4x5 (in/yd) (Generic) Every Other Day/15 Days Discharge Instructions: Apply over Kerlix as directed. 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 - (ICD10 E11.621 - Type 2 diabetes mellitus with foot ulcer) Electronic Signature(s) Signed: 08/30/2020 4:50:17 PM By: Linton Ham MD Signed: 08/30/2020 6:10:24 PM By: Deon Pilling Entered By: Deon Pilling on 08/30/2020 10:48:49 Prescription 08/30/2020 -------------------------------------------------------------------------------- [name] MD Patient Name: Provider: March 16, 1946 [id number] Date of Birth: NPI#: [name] [id number] Sex: DEA #: [phone] Phone #: License #: [place] Patient Address: [address] [place], [place] [address] [place], [place] [address] [phone] Allergies Sulfa Provider's Orders Infectious Disease - ICD10: E11.621 - refer [facility] Infectious Disease related to osteomyelitis to left  foot along with positive PCR culture. Hand Signature: Date(s): Electronic Signature(s) Signed: 08/30/2020 4:50:17 PM By: Linton Ham MD Signed: 08/30/2020 6:10:24 PM By: Deon Pilling Entered By: Deon Pilling on 08/30/2020 10:48:54 -------------------------------------------------------------------------------- Problem List Details Patient Name: Date of Service: [name] Foley F. 08/30/2020 9:45 A M Medical Record Number: [id number] Patient Account Number: [id number] Date of Birth/Sex: Treating RN: November 09, 1945 (75 y.o. [name] [name] Primary Care Provider: [name], [name] Other Clinician: Referring Provider: Treating Provider/Extender: Andelyn Spade [name], [name] Weeks in Treatment: 228 Active Problems ICD-10 Encounter Code Description Active Date MDM  Sween Lotion (Moisturizing lotion) Every Other Day/15 Days Discharge Instructions: Apply moisturizing lotion as directed Prim Dressing: KerraCel Ag Gelling Fiber Dressing, 4x5 in (silver alginate) (Generic) Every Other Day/15 Days ary Discharge Instructions: Apply silver alginate to wound bed as instructed Secondary Dressing: Woven Gauze Sponge, Non-Sterile 4x4 in (Generic) Every Other Day/15 Days Discharge Instructions: Apply over primary dressing as directed. Secondary Dressing: ABD Pad, 5x9 (Generic) Every Other Day/15 Days Discharge Instructions: Apply over primary dressing as directed. Com pression Wrap: Kerlix Roll 4.5x3.1 (in/yd) (Generic) Every Other Day/15 Days Discharge Instructions: Apply Kerlix and Coban compression as directed. Com pression Wrap: Coban Self-Adherent Wrap 4x5 (in/yd) (Generic) Every Other Day/15 Days Discharge Instructions: Apply over Kerlix as directed. #1 the left foot does not look good. General erythema on the dorsal foot. Necrotic wounds over the dorsal ankle and left lateral fifth met head. His CT scan showed osteomyelitis I took a specimen of the bone this showed MRSA and Enterococcus. I am going to send him to see infectious disease however I was not going to delay antibiotics given the condition of the foot  in general 2. The left lateral tibial wound which was his original wound in this clinic for a long period of time actually looks smaller it is getting narrower. 3. The patient has moderate macrovascular disease but is not amenable to revascularization for vein and vascular. This may have a large amount to do with things here. Electronic Signature(s) Signed: 08/30/2020 4:50:17 PM By: Linton Ham MD Entered By: Linton Ham on 08/30/2020 11:05:21 -------------------------------------------------------------------------------- SuperBill Details Patient Name: Date of Service: [name] Foley F. 08/30/2020 Medical Record Number: [id number] Patient Account Number: [id number] Date of Birth/Sex: Treating RN: 06-Jun-1945 (75 y.o. [name] [name] Primary Care Provider: [name], [name] Other Clinician: Referring Provider: Treating Provider/Extender: Kazuto Sevey [name], [name] Weeks in Treatment: 228 Diagnosis Coding ICD-10 Codes Code Description E11.621 Type 2 diabetes mellitus with foot ulcer L97.518 Non-pressure chronic ulcer of other part of right foot with other specified severity L97.221 Non-pressure chronic ulcer of left calf limited to breakdown of skin L97.321 Non-pressure chronic ulcer of left ankle limited to breakdown of skin L97.521 Non-pressure chronic ulcer of other part of left foot limited to breakdown of skin L97.524 Non-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 M86.172 Other acute osteomyelitis, left ankle and foot Facility Procedures CPT4 Code: 10034961 Description: 11043 - DEB MUSC/FASCIA 20 SQ CM/< ICD-10 Diagnosis Description L97.524 Non-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 Modifier: Quantity: 1 Physician Procedures : CPT4 Code Description Modifier [id number] 11043 - WC PHYS DEBR MUSCLE/FASCIA 20 SQ CM ICD-10 Diagnosis Description L97.524 Non-pressure chronic ulcer of other part of left foot with  necrosis of bone Quantity: 1 Electronic Signature(s) Signed: 08/30/2020 4:50:17 PM By: Linton Ham MD Entered By: Linton Ham on 08/30/2020 11:05:42  Sween Lotion (Moisturizing lotion) Every Other Day/15 Days Discharge Instructions: Apply moisturizing lotion as directed Prim Dressing: KerraCel Ag Gelling Fiber Dressing, 4x5 in (silver alginate) (Generic) Every Other Day/15 Days ary Discharge Instructions: Apply silver alginate to wound bed as instructed Secondary Dressing: Woven Gauze Sponge, Non-Sterile 4x4 in (Generic) Every Other Day/15 Days Discharge Instructions: Apply over primary dressing as directed. Secondary Dressing: ABD Pad, 5x9 (Generic) Every Other Day/15 Days Discharge Instructions: Apply over primary dressing as directed. Com pression Wrap: Kerlix Roll 4.5x3.1 (in/yd) (Generic) Every Other Day/15 Days Discharge Instructions: Apply Kerlix and Coban compression as directed. Com pression Wrap: Coban Self-Adherent Wrap 4x5 (in/yd) (Generic) Every Other Day/15 Days Discharge Instructions: Apply over Kerlix as directed. #1 the left foot does not look good. General erythema on the dorsal foot. Necrotic wounds over the dorsal ankle and left lateral fifth met head. His CT scan showed osteomyelitis I took a specimen of the bone this showed MRSA and Enterococcus. I am going to send him to see infectious disease however I was not going to delay antibiotics given the condition of the foot  in general 2. The left lateral tibial wound which was his original wound in this clinic for a long period of time actually looks smaller it is getting narrower. 3. The patient has moderate macrovascular disease but is not amenable to revascularization for vein and vascular. This may have a large amount to do with things here. Electronic Signature(s) Signed: 08/30/2020 4:50:17 PM By: Linton Ham MD Entered By: Linton Ham on 08/30/2020 11:05:21 -------------------------------------------------------------------------------- SuperBill Details Patient Name: Date of Service: [name] Foley F. 08/30/2020 Medical Record Number: [id number] Patient Account Number: [id number] Date of Birth/Sex: Treating RN: 06-Jun-1945 (75 y.o. [name] [name] Primary Care Provider: [name], [name] Other Clinician: Referring Provider: Treating Provider/Extender: Kazuto Sevey [name], [name] Weeks in Treatment: 228 Diagnosis Coding ICD-10 Codes Code Description E11.621 Type 2 diabetes mellitus with foot ulcer L97.518 Non-pressure chronic ulcer of other part of right foot with other specified severity L97.221 Non-pressure chronic ulcer of left calf limited to breakdown of skin L97.321 Non-pressure chronic ulcer of left ankle limited to breakdown of skin L97.521 Non-pressure chronic ulcer of other part of left foot limited to breakdown of skin L97.524 Non-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 M86.172 Other acute osteomyelitis, left ankle and foot Facility Procedures CPT4 Code: 10034961 Description: 11043 - DEB MUSC/FASCIA 20 SQ CM/< ICD-10 Diagnosis Description L97.524 Non-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 Modifier: Quantity: 1 Physician Procedures : CPT4 Code Description Modifier [id number] 11043 - WC PHYS DEBR MUSCLE/FASCIA 20 SQ CM ICD-10 Diagnosis Description L97.524 Non-pressure chronic ulcer of other part of left foot with  necrosis of bone Quantity: 1 Electronic Signature(s) Signed: 08/30/2020 4:50:17 PM By: Linton Ham MD Entered By: Linton Ham on 08/30/2020 11:05:42  Sween Lotion (Moisturizing lotion) Every Other Day/15 Days Discharge Instructions: Apply moisturizing lotion as directed Prim Dressing: KerraCel Ag Gelling Fiber Dressing, 4x5 in (silver alginate) (Generic) Every Other Day/15 Days ary Discharge Instructions: Apply silver alginate to wound bed as instructed Secondary Dressing: Woven Gauze Sponge, Non-Sterile 4x4 in (Generic) Every Other Day/15 Days Discharge Instructions: Apply over primary dressing as directed. Secondary Dressing: ABD Pad, 5x9 (Generic) Every Other Day/15 Days Discharge Instructions: Apply over primary dressing as directed. Com pression Wrap: Kerlix Roll 4.5x3.1 (in/yd) (Generic) Every Other Day/15 Days Discharge Instructions: Apply Kerlix and Coban compression as directed. Com pression Wrap: Coban Self-Adherent Wrap 4x5 (in/yd) (Generic) Every Other Day/15 Days Discharge Instructions: Apply over Kerlix as directed. #1 the left foot does not look good. General erythema on the dorsal foot. Necrotic wounds over the dorsal ankle and left lateral fifth met head. His CT scan showed osteomyelitis I took a specimen of the bone this showed MRSA and Enterococcus. I am going to send him to see infectious disease however I was not going to delay antibiotics given the condition of the foot  in general 2. The left lateral tibial wound which was his original wound in this clinic for a long period of time actually looks smaller it is getting narrower. 3. The patient has moderate macrovascular disease but is not amenable to revascularization for vein and vascular. This may have a large amount to do with things here. Electronic Signature(s) Signed: 08/30/2020 4:50:17 PM By: Linton Ham MD Entered By: Linton Ham on 08/30/2020 11:05:21 -------------------------------------------------------------------------------- SuperBill Details Patient Name: Date of Service: [name] Foley F. 08/30/2020 Medical Record Number: [id number] Patient Account Number: [id number] Date of Birth/Sex: Treating RN: 06-Jun-1945 (75 y.o. [name] [name] Primary Care Provider: [name], [name] Other Clinician: Referring Provider: Treating Provider/Extender: Kazuto Sevey [name], [name] Weeks in Treatment: 228 Diagnosis Coding ICD-10 Codes Code Description E11.621 Type 2 diabetes mellitus with foot ulcer L97.518 Non-pressure chronic ulcer of other part of right foot with other specified severity L97.221 Non-pressure chronic ulcer of left calf limited to breakdown of skin L97.321 Non-pressure chronic ulcer of left ankle limited to breakdown of skin L97.521 Non-pressure chronic ulcer of other part of left foot limited to breakdown of skin L97.524 Non-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 M86.172 Other acute osteomyelitis, left ankle and foot Facility Procedures CPT4 Code: 10034961 Description: 11043 - DEB MUSC/FASCIA 20 SQ CM/< ICD-10 Diagnosis Description L97.524 Non-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 Modifier: Quantity: 1 Physician Procedures : CPT4 Code Description Modifier [id number] 11043 - WC PHYS DEBR MUSCLE/FASCIA 20 SQ CM ICD-10 Diagnosis Description L97.524 Non-pressure chronic ulcer of other part of left foot with  necrosis of bone Quantity: 1 Electronic Signature(s) Signed: 08/30/2020 4:50:17 PM By: Linton Ham MD Entered By: Linton Ham on 08/30/2020 11:05:42  or cotton balls. Cleanser: Soap and Water Every Other Day/15 Days Discharge Instructions: May shower and wash wound with dial antibacterial soap and water prior to dressing change. Peri-Wound Care: Triamcinolone 15 (g) Every Other Day/15 Days Discharge Instructions: Use triamcinolone 15 (g) as directed Peri-Wound Care: Sween Lotion (Moisturizing lotion) Every Other Day/15 Days Discharge Instructions: Apply moisturizing lotion as directed Prim Dressing: KerraCel Ag Gelling Fiber Dressing, 4x5 in (silver alginate) (Generic) Every Other Day/15 Days ary Discharge Instructions: Apply silver alginate to wound bed as instructed Secondary Dressing: Woven Gauze Sponge, Non-Sterile 4x4 in (Generic) Every Other Day/15 Days Discharge Instructions: Apply over primary dressing as directed. Secondary Dressing: ABD Pad, 5x9 (Generic) Every Other Day/15 Days Discharge Instructions: Apply over primary dressing as directed. Compression Wrap: Kerlix Roll 4.5x3.1 (in/yd) (Generic) Every Other Day/15 Days Discharge Instructions: Apply Kerlix and Coban compression as directed. Compression Wrap: Coban Self-Adherent Wrap 4x5 (in/yd) (Generic) Every Other Day/15 Days Discharge Instructions: Apply over Kerlix as directed. Wound #36 - Foot Wound Laterality: Left, Lateral Cleanser: Wound Cleanser (Generic) Every Other Day/15 Days Discharge Instructions: Cleanse the wound with wound cleanser prior to applying a clean dressing using gauze sponges, not tissue or cotton balls. Cleanser: Soap and Water Every Other Day/15 Days Discharge Instructions: May shower and wash wound with dial antibacterial soap and water prior to dressing change. Peri-Wound Care: Triamcinolone 15 (g) Every Other Day/15 Days Discharge Instructions:  Use triamcinolone 15 (g) as directed Peri-Wound Care: Sween Lotion (Moisturizing lotion) Every Other Day/15 Days Discharge Instructions: Apply moisturizing lotion as directed Prim Dressing: KerraCel Ag Gelling Fiber Dressing, 4x5 in (silver alginate) (Generic) Every Other Day/15 Days ary Discharge Instructions: Apply silver alginate to wound bed as instructed Secondary Dressing: Woven Gauze Sponge, Non-Sterile 4x4 in (Generic) Every Other Day/15 Days Discharge Instructions: Apply over primary dressing as directed. Secondary Dressing: ABD Pad, 5x9 (Generic) Every Other Day/15 Days Discharge Instructions: Apply over primary dressing as directed. Compression Wrap: Kerlix Roll 4.5x3.1 (in/yd) (Generic) Every Other Day/15 Days Discharge Instructions: Apply Kerlix and Coban compression as directed. Compression Wrap: Coban Self-Adherent Wrap 4x5 (in/yd) (Generic) Every Other Day/15 Days Discharge Instructions: Apply over Kerlix as directed. 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 - (ICD10 E11.621 - Type 2 diabetes mellitus with foot ulcer) Electronic Signature(s) Signed: 08/30/2020 4:50:17 PM By: Linton Ham MD Signed: 08/30/2020 6:10:24 PM By: Deon Pilling Entered By: Deon Pilling on 08/30/2020 10:48:49 Prescription 08/30/2020 -------------------------------------------------------------------------------- [name] MD Patient Name: Provider: March 16, 1946 [id number] Date of Birth: NPI#: [name] [id number] Sex: DEA #: [phone] Phone #: License #: [place] Patient Address: [address] [place], [place] [address] [place], [place] [address] [phone] Allergies Sulfa Provider's Orders Infectious Disease - ICD10: E11.621 - refer [facility] Infect[facility] Disease related to osteomyelitis to left  foot along with positive PCR culture. Hand Signature: Date(s): Electronic Signature(s) Signed: 08/30/2020 4:50:17 PM By: Linton Ham MD Signed: 08/30/2020 6:10:24 PM By: Deon Pilling Entered By: Deon Pilling on 08/30/2020 10:48:54 -------------------------------------------------------------------------------- Problem List Details Patient Name: Date of Service: [name] Foley F. 08/30/2020 9:45 A M Medical Record Number: [id number] Patient Account Number: [id number] Date of Birth/Sex: Treating RN: November 09, 1945 (75 y.o. [name] [name] Primary Care Provider: [name], [name] Other Clinician: Referring Provider: Treating Provider/Extender: Andelyn Spade [name], [name] Weeks in Treatment: 228 Active Problems ICD-10 Encounter Code Description Active Date MDM  Sween Lotion (Moisturizing lotion) Every Other Day/15 Days Discharge Instructions: Apply moisturizing lotion as directed Prim Dressing: KerraCel Ag Gelling Fiber Dressing, 4x5 in (silver alginate) (Generic) Every Other Day/15 Days ary Discharge Instructions: Apply silver alginate to wound bed as instructed Secondary Dressing: Woven Gauze Sponge, Non-Sterile 4x4 in (Generic) Every Other Day/15 Days Discharge Instructions: Apply over primary dressing as directed. Secondary Dressing: ABD Pad, 5x9 (Generic) Every Other Day/15 Days Discharge Instructions: Apply over primary dressing as directed. Com pression Wrap: Kerlix Roll 4.5x3.1 (in/yd) (Generic) Every Other Day/15 Days Discharge Instructions: Apply Kerlix and Coban compression as directed. Com pression Wrap: Coban Self-Adherent Wrap 4x5 (in/yd) (Generic) Every Other Day/15 Days Discharge Instructions: Apply over Kerlix as directed. #1 the left foot does not look good. General erythema on the dorsal foot. Necrotic wounds over the dorsal ankle and left lateral fifth met head. His CT scan showed osteomyelitis I took a specimen of the bone this showed MRSA and Enterococcus. I am going to send him to see infectious disease however I was not going to delay antibiotics given the condition of the foot  in general 2. The left lateral tibial wound which was his original wound in this clinic for a long period of time actually looks smaller it is getting narrower. 3. The patient has moderate macrovascular disease but is not amenable to revascularization for vein and vascular. This may have a large amount to do with things here. Electronic Signature(s) Signed: 08/30/2020 4:50:17 PM By: Linton Ham MD Entered By: Linton Ham on 08/30/2020 11:05:21 -------------------------------------------------------------------------------- SuperBill Details Patient Name: Date of Service: [name] Foley F. 08/30/2020 Medical Record Number: [id number] Patient Account Number: [id number] Date of Birth/Sex: Treating RN: 06-Jun-1945 (75 y.o. [name] [name] Primary Care Provider: [name], [name] Other Clinician: Referring Provider: Treating Provider/Extender: Kazuto Sevey [name], [name] Weeks in Treatment: 228 Diagnosis Coding ICD-10 Codes Code Description E11.621 Type 2 diabetes mellitus with foot ulcer L97.518 Non-pressure chronic ulcer of other part of right foot with other specified severity L97.221 Non-pressure chronic ulcer of left calf limited to breakdown of skin L97.321 Non-pressure chronic ulcer of left ankle limited to breakdown of skin L97.521 Non-pressure chronic ulcer of other part of left foot limited to breakdown of skin L97.524 Non-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 M86.172 Other acute osteomyelitis, left ankle and foot Facility Procedures CPT4 Code: 10034961 Description: 11043 - DEB MUSC/FASCIA 20 SQ CM/< ICD-10 Diagnosis Description L97.524 Non-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 Modifier: Quantity: 1 Physician Procedures : CPT4 Code Description Modifier [id number] 11043 - WC PHYS DEBR MUSCLE/FASCIA 20 SQ CM ICD-10 Diagnosis Description L97.524 Non-pressure chronic ulcer of other part of left foot with  necrosis of bone Quantity: 1 Electronic Signature(s) Signed: 08/30/2020 4:50:17 PM By: Linton Ham MD Entered By: Linton Ham on 08/30/2020 11:05:42  Sween Lotion (Moisturizing lotion) Every Other Day/15 Days Discharge Instructions: Apply moisturizing lotion as directed Prim Dressing: KerraCel Ag Gelling Fiber Dressing, 4x5 in (silver alginate) (Generic) Every Other Day/15 Days ary Discharge Instructions: Apply silver alginate to wound bed as instructed Secondary Dressing: Woven Gauze Sponge, Non-Sterile 4x4 in (Generic) Every Other Day/15 Days Discharge Instructions: Apply over primary dressing as directed. Secondary Dressing: ABD Pad, 5x9 (Generic) Every Other Day/15 Days Discharge Instructions: Apply over primary dressing as directed. Com pression Wrap: Kerlix Roll 4.5x3.1 (in/yd) (Generic) Every Other Day/15 Days Discharge Instructions: Apply Kerlix and Coban compression as directed. Com pression Wrap: Coban Self-Adherent Wrap 4x5 (in/yd) (Generic) Every Other Day/15 Days Discharge Instructions: Apply over Kerlix as directed. #1 the left foot does not look good. General erythema on the dorsal foot. Necrotic wounds over the dorsal ankle and left lateral fifth met head. His CT scan showed osteomyelitis I took a specimen of the bone this showed MRSA and Enterococcus. I am going to send him to see infectious disease however I was not going to delay antibiotics given the condition of the foot  in general 2. The left lateral tibial wound which was his original wound in this clinic for a long period of time actually looks smaller it is getting narrower. 3. The patient has moderate macrovascular disease but is not amenable to revascularization for vein and vascular. This may have a large amount to do with things here. Electronic Signature(s) Signed: 08/30/2020 4:50:17 PM By: Linton Ham MD Entered By: Linton Ham on 08/30/2020 11:05:21 -------------------------------------------------------------------------------- SuperBill Details Patient Name: Date of Service: [name] Foley F. 08/30/2020 Medical Record Number: [id number] Patient Account Number: [id number] Date of Birth/Sex: Treating RN: 06-Jun-1945 (75 y.o. [name] [name] Primary Care Provider: [name], [name] Other Clinician: Referring Provider: Treating Provider/Extender: Kazuto Sevey [name], [name] Weeks in Treatment: 228 Diagnosis Coding ICD-10 Codes Code Description E11.621 Type 2 diabetes mellitus with foot ulcer L97.518 Non-pressure chronic ulcer of other part of right foot with other specified severity L97.221 Non-pressure chronic ulcer of left calf limited to breakdown of skin L97.321 Non-pressure chronic ulcer of left ankle limited to breakdown of skin L97.521 Non-pressure chronic ulcer of other part of left foot limited to breakdown of skin L97.524 Non-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 M86.172 Other acute osteomyelitis, left ankle and foot Facility Procedures CPT4 Code: 10034961 Description: 11043 - DEB MUSC/FASCIA 20 SQ CM/< ICD-10 Diagnosis Description L97.524 Non-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 Modifier: Quantity: 1 Physician Procedures : CPT4 Code Description Modifier [id number] 11043 - WC PHYS DEBR MUSCLE/FASCIA 20 SQ CM ICD-10 Diagnosis Description L97.524 Non-pressure chronic ulcer of other part of left foot with  necrosis of bone Quantity: 1 Electronic Signature(s) Signed: 08/30/2020 4:50:17 PM By: Linton Ham MD Entered By: Linton Ham on 08/30/2020 11:05:42  or cotton balls. Cleanser: Soap and Water Every Other Day/15 Days Discharge Instructions: May shower and wash wound with dial antibacterial soap and water prior to dressing change. Peri-Wound Care: Triamcinolone 15 (g) Every Other Day/15 Days Discharge Instructions: Use triamcinolone 15 (g) as directed Peri-Wound Care: Sween Lotion (Moisturizing lotion) Every Other Day/15 Days Discharge Instructions: Apply moisturizing lotion as directed Prim Dressing: KerraCel Ag Gelling Fiber Dressing, 4x5 in (silver alginate) (Generic) Every Other Day/15 Days ary Discharge Instructions: Apply silver alginate to wound bed as instructed Secondary Dressing: Woven Gauze Sponge, Non-Sterile 4x4 in (Generic) Every Other Day/15 Days Discharge Instructions: Apply over primary dressing as directed. Secondary Dressing: ABD Pad, 5x9 (Generic) Every Other Day/15 Days Discharge Instructions: Apply over primary dressing as directed. Compression Wrap: Kerlix Roll 4.5x3.1 (in/yd) (Generic) Every Other Day/15 Days Discharge Instructions: Apply Kerlix and Coban compression as directed. Compression Wrap: Coban Self-Adherent Wrap 4x5 (in/yd) (Generic) Every Other Day/15 Days Discharge Instructions: Apply over Kerlix as directed. Wound #36 - Foot Wound Laterality: Left, Lateral Cleanser: Wound Cleanser (Generic) Every Other Day/15 Days Discharge Instructions: Cleanse the wound with wound cleanser prior to applying a clean dressing using gauze sponges, not tissue or cotton balls. Cleanser: Soap and Water Every Other Day/15 Days Discharge Instructions: May shower and wash wound with dial antibacterial soap and water prior to dressing change. Peri-Wound Care: Triamcinolone 15 (g) Every Other Day/15 Days Discharge Instructions:  Use triamcinolone 15 (g) as directed Peri-Wound Care: Sween Lotion (Moisturizing lotion) Every Other Day/15 Days Discharge Instructions: Apply moisturizing lotion as directed Prim Dressing: KerraCel Ag Gelling Fiber Dressing, 4x5 in (silver alginate) (Generic) Every Other Day/15 Days ary Discharge Instructions: Apply silver alginate to wound bed as instructed Secondary Dressing: Woven Gauze Sponge, Non-Sterile 4x4 in (Generic) Every Other Day/15 Days Discharge Instructions: Apply over primary dressing as directed. Secondary Dressing: ABD Pad, 5x9 (Generic) Every Other Day/15 Days Discharge Instructions: Apply over primary dressing as directed. Compression Wrap: Kerlix Roll 4.5x3.1 (in/yd) (Generic) Every Other Day/15 Days Discharge Instructions: Apply Kerlix and Coban compression as directed. Compression Wrap: Coban Self-Adherent Wrap 4x5 (in/yd) (Generic) Every Other Day/15 Days Discharge Instructions: Apply over Kerlix as directed. 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 - (ICD10 E11.621 - Type 2 diabetes mellitus with foot ulcer) Electronic Signature(s) Signed: 08/30/2020 4:50:17 PM By: Linton Ham MD Signed: 08/30/2020 6:10:24 PM By: Deon Pilling Entered By: Deon Pilling on 08/30/2020 10:48:49 Prescription 08/30/2020 -------------------------------------------------------------------------------- [name] MD Patient Name: Provider: March 16, 1946 [id number] Date of Birth: NPI#: [name] [id number] Sex: DEA #: [phone] Phone #: License #: [place] Patient Address: [address] [place], [place] [address] [place], [place] [address] [phone] Allergies Sulfa Provider's Orders Infectious Disease - ICD10: E11.621 - refer [facility] Infect[facility] Disease related to osteomyelitis to left  foot along with positive PCR culture. Hand Signature: Date(s): Electronic Signature(s) Signed: 08/30/2020 4:50:17 PM By: Linton Ham MD Signed: 08/30/2020 6:10:24 PM By: Deon Pilling Entered By: Deon Pilling on 08/30/2020 10:48:54 -------------------------------------------------------------------------------- Problem List Details Patient Name: Date of Service: [name] Foley F. 08/30/2020 9:45 A M Medical Record Number: [id number] Patient Account Number: [id number] Date of Birth/Sex: Treating RN: November 09, 1945 (75 y.o. [name] [name] Primary Care Provider: [name], [name] Other Clinician: Referring Provider: Treating Provider/Extender: Andelyn Spade [name], [name] Weeks in Treatment: 228 Active Problems ICD-10 Encounter Code Description Active Date MDM  for 2-week follow-up. He has no complaints today. He finished his course of antibiotics scheduled at last visit for a culture that grew moderate strep group B. He has no new wounds today. His daughter continues to do his dressings 5/3; 2-week follow-up. Since he was last here he had a CT scan of his left foot that suggested osteomyelitis over the fifth MTP joint which is unfortunately what we expected to see. His daughter had called in late last week to say there was an odor  and exposed bone. I did not appreciate an odor and there was no exposed bone but there certainly was a necrotic surface. He also has areas on the left dorsal ankle right dorsal ankle left lateral lower leg He also saw Dr. Loletta Specter on 4/26. 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 He had a CO2 angiogram mostly had a brisk peroneal signal. It was felt that he would only have an option of an amputation from vascular surgery point of view. The patient and his daughter are very opposed to this although he is only minimally ambulatory 5/10; PCR culture of the left fifth met head bone showed predominantly MRSA and Enterococcus. I started him on Augmentin I am going to try to get linezolid through his insurance. He is allergic to sulfa and I believe the MRSA had tetracycline resistance genes. I am also going to get him seen by infectious disease. The left foot is particularly bad he has a necrotic wound on the left dorsal ankle with exposed tendon hyper granulated area below this and the necrotic area on the left fifth met head which was his most recent wound and then the original area on the left lateral lower leg which gradually, paradoxically is getting better. Electronic Signature(s) Signed: 08/30/2020 4:50:17 PM By: Linton Ham MD Entered By: Linton Ham on 08/30/2020 11:02:02 -------------------------------------------------------------------------------- Physical Exam Details Patient Name: Date of Service: [name] Foley F. 08/30/2020 9:45 A M Medical Record Number: [id number] Patient Account Number: [id number] Date of Birth/Sex: Treating RN: 06/08/1945 (75 y.o. [name] [name] Primary Care Provider: [name], [name] Other Clinician: Referring Provider: Treating Provider/Extender: Tomicka Lover [name], [name] Weeks in Treatment: 228 Constitutional Sitting or standing Blood Pressure is within target range for patient.. Pulse regular and within target range for  patient.Marland Kitchen Respirations regular, non-labored and within target range.. Temperature is normal and within the target range for the patient.Marland Kitchen Appears in no distress. Notes Wound exam Right lower extremity dorsal foot does not look too bad. Right dorsal foot and right lateral lower leg Much worse is the left. Necrotic wound over the lateral part of the fifth met head. Necrotic wound with exposed tendon in the dorsal ankle smaller area just below this which is hyper granulated I used a #15 scalpel and pickups to remove the tendon and debris from the surface of the skin Electronic Signature(s) Signed: 08/30/2020 4:50:17 PM By: Linton Ham MD Entered By: Linton Ham on 08/30/2020 11:03:27 -------------------------------------------------------------------------------- Physician Orders Details Patient Name: Date of Service: [name] Foley F. 08/30/2020 9:45 A M Medical Record Number: [id number] Patient Account Number: [id number] Date of Birth/Sex: Treating RN: [birth year] (75 y.o. [name] [name] Primary Care Provider: [name], [name] Other Clinician: Referring Provider: Treating Provider/Extender: Alaiza Yau [name], [name] Weeks in Treatment: 228 Verbal / Phone Orders: No Diagnosis Coding ICD-10 Coding Code Description E11.621 Type 2 diabetes mellitus with foot ulcer L97.518 Non-pressure chronic ulcer of other part of right foot with other specified  or cotton balls. Cleanser: Soap and Water Every Other Day/15 Days Discharge Instructions: May shower and wash wound with dial antibacterial soap and water prior to dressing change. Peri-Wound Care: Triamcinolone 15 (g) Every Other Day/15 Days Discharge Instructions: Use triamcinolone 15 (g) as directed Peri-Wound Care: Sween Lotion (Moisturizing lotion) Every Other Day/15 Days Discharge Instructions: Apply moisturizing lotion as directed Prim Dressing: KerraCel Ag Gelling Fiber Dressing, 4x5 in (silver alginate) (Generic) Every Other Day/15 Days ary Discharge Instructions: Apply silver alginate to wound bed as instructed Secondary Dressing: Woven Gauze Sponge, Non-Sterile 4x4 in (Generic) Every Other Day/15 Days Discharge Instructions: Apply over primary dressing as directed. Secondary Dressing: ABD Pad, 5x9 (Generic) Every Other Day/15 Days Discharge Instructions: Apply over primary dressing as directed. Compression Wrap: Kerlix Roll 4.5x3.1 (in/yd) (Generic) Every Other Day/15 Days Discharge Instructions: Apply Kerlix and Coban compression as directed. Compression Wrap: Coban Self-Adherent Wrap 4x5 (in/yd) (Generic) Every Other Day/15 Days Discharge Instructions: Apply over Kerlix as directed. Wound #36 - Foot Wound Laterality: Left, Lateral Cleanser: Wound Cleanser (Generic) Every Other Day/15 Days Discharge Instructions: Cleanse the wound with wound cleanser prior to applying a clean dressing using gauze sponges, not tissue or cotton balls. Cleanser: Soap and Water Every Other Day/15 Days Discharge Instructions: May shower and wash wound with dial antibacterial soap and water prior to dressing change. Peri-Wound Care: Triamcinolone 15 (g) Every Other Day/15 Days Discharge Instructions:  Use triamcinolone 15 (g) as directed Peri-Wound Care: Sween Lotion (Moisturizing lotion) Every Other Day/15 Days Discharge Instructions: Apply moisturizing lotion as directed Prim Dressing: KerraCel Ag Gelling Fiber Dressing, 4x5 in (silver alginate) (Generic) Every Other Day/15 Days ary Discharge Instructions: Apply silver alginate to wound bed as instructed Secondary Dressing: Woven Gauze Sponge, Non-Sterile 4x4 in (Generic) Every Other Day/15 Days Discharge Instructions: Apply over primary dressing as directed. Secondary Dressing: ABD Pad, 5x9 (Generic) Every Other Day/15 Days Discharge Instructions: Apply over primary dressing as directed. Compression Wrap: Kerlix Roll 4.5x3.1 (in/yd) (Generic) Every Other Day/15 Days Discharge Instructions: Apply Kerlix and Coban compression as directed. Compression Wrap: Coban Self-Adherent Wrap 4x5 (in/yd) (Generic) Every Other Day/15 Days Discharge Instructions: Apply over Kerlix as directed. 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 - (ICD10 E11.621 - Type 2 diabetes mellitus with foot ulcer) Electronic Signature(s) Signed: 08/30/2020 4:50:17 PM By: Linton Ham MD Signed: 08/30/2020 6:10:24 PM By: Deon Pilling Entered By: Deon Pilling on 08/30/2020 10:48:49 Prescription 08/30/2020 -------------------------------------------------------------------------------- [name] MD Patient Name: Provider: March 16, 1946 [id number] Date of Birth: NPI#: [name] [id number] Sex: DEA #: [phone] Phone #: License #: [place] Patient Address: [address] [place], [place] [address] [place], [place] [address] [phone] Allergies Sulfa Provider's Orders Infectious Disease - ICD10: E11.621 - refer [facility] Infect[facility] Disease related to osteomyelitis to left  foot along with positive PCR culture. Hand Signature: Date(s): Electronic Signature(s) Signed: 08/30/2020 4:50:17 PM By: Linton Ham MD Signed: 08/30/2020 6:10:24 PM By: Deon Pilling Entered By: Deon Pilling on 08/30/2020 10:48:54 -------------------------------------------------------------------------------- Problem List Details Patient Name: Date of Service: [name] Foley F. 08/30/2020 9:45 A M Medical Record Number: [id number] Patient Account Number: [id number] Date of Birth/Sex: Treating RN: November 09, 1945 (75 y.o. [name] [name] Primary Care Provider: [name], [name] Other Clinician: Referring Provider: Treating Provider/Extender: Andelyn Spade [name], [name] Weeks in Treatment: 228 Active Problems ICD-10 Encounter Code Description Active Date MDM  or cotton balls. Cleanser: Soap and Water Every Other Day/15 Days Discharge Instructions: May shower and wash wound with dial antibacterial soap and water prior to dressing change. Peri-Wound Care: Triamcinolone 15 (g) Every Other Day/15 Days Discharge Instructions: Use triamcinolone 15 (g) as directed Peri-Wound Care: Sween Lotion (Moisturizing lotion) Every Other Day/15 Days Discharge Instructions: Apply moisturizing lotion as directed Prim Dressing: KerraCel Ag Gelling Fiber Dressing, 4x5 in (silver alginate) (Generic) Every Other Day/15 Days ary Discharge Instructions: Apply silver alginate to wound bed as instructed Secondary Dressing: Woven Gauze Sponge, Non-Sterile 4x4 in (Generic) Every Other Day/15 Days Discharge Instructions: Apply over primary dressing as directed. Secondary Dressing: ABD Pad, 5x9 (Generic) Every Other Day/15 Days Discharge Instructions: Apply over primary dressing as directed. Compression Wrap: Kerlix Roll 4.5x3.1 (in/yd) (Generic) Every Other Day/15 Days Discharge Instructions: Apply Kerlix and Coban compression as directed. Compression Wrap: Coban Self-Adherent Wrap 4x5 (in/yd) (Generic) Every Other Day/15 Days Discharge Instructions: Apply over Kerlix as directed. Wound #36 - Foot Wound Laterality: Left, Lateral Cleanser: Wound Cleanser (Generic) Every Other Day/15 Days Discharge Instructions: Cleanse the wound with wound cleanser prior to applying a clean dressing using gauze sponges, not tissue or cotton balls. Cleanser: Soap and Water Every Other Day/15 Days Discharge Instructions: May shower and wash wound with dial antibacterial soap and water prior to dressing change. Peri-Wound Care: Triamcinolone 15 (g) Every Other Day/15 Days Discharge Instructions:  Use triamcinolone 15 (g) as directed Peri-Wound Care: Sween Lotion (Moisturizing lotion) Every Other Day/15 Days Discharge Instructions: Apply moisturizing lotion as directed Prim Dressing: KerraCel Ag Gelling Fiber Dressing, 4x5 in (silver alginate) (Generic) Every Other Day/15 Days ary Discharge Instructions: Apply silver alginate to wound bed as instructed Secondary Dressing: Woven Gauze Sponge, Non-Sterile 4x4 in (Generic) Every Other Day/15 Days Discharge Instructions: Apply over primary dressing as directed. Secondary Dressing: ABD Pad, 5x9 (Generic) Every Other Day/15 Days Discharge Instructions: Apply over primary dressing as directed. Compression Wrap: Kerlix Roll 4.5x3.1 (in/yd) (Generic) Every Other Day/15 Days Discharge Instructions: Apply Kerlix and Coban compression as directed. Compression Wrap: Coban Self-Adherent Wrap 4x5 (in/yd) (Generic) Every Other Day/15 Days Discharge Instructions: Apply over Kerlix as directed. 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 - (ICD10 E11.621 - Type 2 diabetes mellitus with foot ulcer) Electronic Signature(s) Signed: 08/30/2020 4:50:17 PM By: Linton Ham MD Signed: 08/30/2020 6:10:24 PM By: Deon Pilling Entered By: Deon Pilling on 08/30/2020 10:48:49 Prescription 08/30/2020 -------------------------------------------------------------------------------- [name] MD Patient Name: Provider: March 16, 1946 [id number] Date of Birth: NPI#: [name] [id number] Sex: DEA #: [phone] Phone #: License #: [place] Patient Address: [address] [place], [place] [address] [place], [place] [address] [phone] Allergies Sulfa Provider's Orders Infectious Disease - ICD10: E11.621 - refer [facility] Infect[facility] Disease related to osteomyelitis to left  foot along with positive PCR culture. Hand Signature: Date(s): Electronic Signature(s) Signed: 08/30/2020 4:50:17 PM By: Linton Ham MD Signed: 08/30/2020 6:10:24 PM By: Deon Pilling Entered By: Deon Pilling on 08/30/2020 10:48:54 -------------------------------------------------------------------------------- Problem List Details Patient Name: Date of Service: [name] Foley F. 08/30/2020 9:45 A M Medical Record Number: [id number] Patient Account Number: [id number] Date of Birth/Sex: Treating RN: November 09, 1945 (75 y.o. [name] [name] Primary Care Provider: [name], [name] Other Clinician: Referring Provider: Treating Provider/Extender: Andelyn Spade [name], [name] Weeks in Treatment: 228 Active Problems ICD-10 Encounter Code Description Active Date MDM  or cotton balls. Cleanser: Soap and Water Every Other Day/15 Days Discharge Instructions: May shower and wash wound with dial antibacterial soap and water prior to dressing change. Peri-Wound Care: Triamcinolone 15 (g) Every Other Day/15 Days Discharge Instructions: Use triamcinolone 15 (g) as directed Peri-Wound Care: Sween Lotion (Moisturizing lotion) Every Other Day/15 Days Discharge Instructions: Apply moisturizing lotion as directed Prim Dressing: KerraCel Ag Gelling Fiber Dressing, 4x5 in (silver alginate) (Generic) Every Other Day/15 Days ary Discharge Instructions: Apply silver alginate to wound bed as instructed Secondary Dressing: Woven Gauze Sponge, Non-Sterile 4x4 in (Generic) Every Other Day/15 Days Discharge Instructions: Apply over primary dressing as directed. Secondary Dressing: ABD Pad, 5x9 (Generic) Every Other Day/15 Days Discharge Instructions: Apply over primary dressing as directed. Compression Wrap: Kerlix Roll 4.5x3.1 (in/yd) (Generic) Every Other Day/15 Days Discharge Instructions: Apply Kerlix and Coban compression as directed. Compression Wrap: Coban Self-Adherent Wrap 4x5 (in/yd) (Generic) Every Other Day/15 Days Discharge Instructions: Apply over Kerlix as directed. Wound #36 - Foot Wound Laterality: Left, Lateral Cleanser: Wound Cleanser (Generic) Every Other Day/15 Days Discharge Instructions: Cleanse the wound with wound cleanser prior to applying a clean dressing using gauze sponges, not tissue or cotton balls. Cleanser: Soap and Water Every Other Day/15 Days Discharge Instructions: May shower and wash wound with dial antibacterial soap and water prior to dressing change. Peri-Wound Care: Triamcinolone 15 (g) Every Other Day/15 Days Discharge Instructions:  Use triamcinolone 15 (g) as directed Peri-Wound Care: Sween Lotion (Moisturizing lotion) Every Other Day/15 Days Discharge Instructions: Apply moisturizing lotion as directed Prim Dressing: KerraCel Ag Gelling Fiber Dressing, 4x5 in (silver alginate) (Generic) Every Other Day/15 Days ary Discharge Instructions: Apply silver alginate to wound bed as instructed Secondary Dressing: Woven Gauze Sponge, Non-Sterile 4x4 in (Generic) Every Other Day/15 Days Discharge Instructions: Apply over primary dressing as directed. Secondary Dressing: ABD Pad, 5x9 (Generic) Every Other Day/15 Days Discharge Instructions: Apply over primary dressing as directed. Compression Wrap: Kerlix Roll 4.5x3.1 (in/yd) (Generic) Every Other Day/15 Days Discharge Instructions: Apply Kerlix and Coban compression as directed. Compression Wrap: Coban Self-Adherent Wrap 4x5 (in/yd) (Generic) Every Other Day/15 Days Discharge Instructions: Apply over Kerlix as directed. 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 - (ICD10 E11.621 - Type 2 diabetes mellitus with foot ulcer) Electronic Signature(s) Signed: 08/30/2020 4:50:17 PM By: Linton Ham MD Signed: 08/30/2020 6:10:24 PM By: Deon Pilling Entered By: Deon Pilling on 08/30/2020 10:48:49 Prescription 08/30/2020 -------------------------------------------------------------------------------- [name] MD Patient Name: Provider: March 16, 1946 [id number] Date of Birth: NPI#: [name] [id number] Sex: DEA #: [phone] Phone #: License #: [place] Patient Address: [address] [place], [place] [address] [place], [place] [address] [phone] Allergies Sulfa Provider's Orders Infectious Disease - ICD10: E11.621 - refer [facility] Infectious Disease related to osteomyelitis to left  foot along with positive PCR culture. Hand Signature: Date(s): Electronic Signature(s) Signed: 08/30/2020 4:50:17 PM By: Linton Ham MD Signed: 08/30/2020 6:10:24 PM By: Deon Pilling Entered By: Deon Pilling on 08/30/2020 10:48:54 -------------------------------------------------------------------------------- Problem List Details Patient Name: Date of Service: [name] Foley F. 08/30/2020 9:45 A M Medical Record Number: [id number] Patient Account Number: [id number] Date of Birth/Sex: Treating RN: November 09, 1945 (75 y.o. [name] [name] Primary Care Provider: [name], [name] Other Clinician: Referring Provider: Treating Provider/Extender: Andelyn Spade [name], [name] Weeks in Treatment: 228 Active Problems ICD-10 Encounter Code Description Active Date MDM  Sween Lotion (Moisturizing lotion) Every Other Day/15 Days Discharge Instructions: Apply moisturizing lotion as directed Prim Dressing: KerraCel Ag Gelling Fiber Dressing, 4x5 in (silver alginate) (Generic) Every Other Day/15 Days ary Discharge Instructions: Apply silver alginate to wound bed as instructed Secondary Dressing: Woven Gauze Sponge, Non-Sterile 4x4 in (Generic) Every Other Day/15 Days Discharge Instructions: Apply over primary dressing as directed. Secondary Dressing: ABD Pad, 5x9 (Generic) Every Other Day/15 Days Discharge Instructions: Apply over primary dressing as directed. Com pression Wrap: Kerlix Roll 4.5x3.1 (in/yd) (Generic) Every Other Day/15 Days Discharge Instructions: Apply Kerlix and Coban compression as directed. Com pression Wrap: Coban Self-Adherent Wrap 4x5 (in/yd) (Generic) Every Other Day/15 Days Discharge Instructions: Apply over Kerlix as directed. #1 the left foot does not look good. General erythema on the dorsal foot. Necrotic wounds over the dorsal ankle and left lateral fifth met head. His CT scan showed osteomyelitis I took a specimen of the bone this showed MRSA and Enterococcus. I am going to send him to see infectious disease however I was not going to delay antibiotics given the condition of the foot  in general 2. The left lateral tibial wound which was his original wound in this clinic for a long period of time actually looks smaller it is getting narrower. 3. The patient has moderate macrovascular disease but is not amenable to revascularization for vein and vascular. This may have a large amount to do with things here. Electronic Signature(s) Signed: 08/30/2020 4:50:17 PM By: Linton Ham MD Entered By: Linton Ham on 08/30/2020 11:05:21 -------------------------------------------------------------------------------- SuperBill Details Patient Name: Date of Service: [name] Foley F. 08/30/2020 Medical Record Number: [id number] Patient Account Number: [id number] Date of Birth/Sex: Treating RN: 06-Jun-1945 (75 y.o. [name] [name] Primary Care Provider: [name], [name] Other Clinician: Referring Provider: Treating Provider/Extender: Kazuto Sevey [name], [name] Weeks in Treatment: 228 Diagnosis Coding ICD-10 Codes Code Description E11.621 Type 2 diabetes mellitus with foot ulcer L97.518 Non-pressure chronic ulcer of other part of right foot with other specified severity L97.221 Non-pressure chronic ulcer of left calf limited to breakdown of skin L97.321 Non-pressure chronic ulcer of left ankle limited to breakdown of skin L97.521 Non-pressure chronic ulcer of other part of left foot limited to breakdown of skin L97.524 Non-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 M86.172 Other acute osteomyelitis, left ankle and foot Facility Procedures CPT4 Code: 10034961 Description: 11043 - DEB MUSC/FASCIA 20 SQ CM/< ICD-10 Diagnosis Description L97.524 Non-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 Modifier: Quantity: 1 Physician Procedures : CPT4 Code Description Modifier [id number] 11043 - WC PHYS DEBR MUSCLE/FASCIA 20 SQ CM ICD-10 Diagnosis Description L97.524 Non-pressure chronic ulcer of other part of left foot with  necrosis of bone Quantity: 1 Electronic Signature(s) Signed: 08/30/2020 4:50:17 PM By: Linton Ham MD Entered By: Linton Ham on 08/30/2020 11:05:42  Sween Lotion (Moisturizing lotion) Every Other Day/15 Days Discharge Instructions: Apply moisturizing lotion as directed Prim Dressing: KerraCel Ag Gelling Fiber Dressing, 4x5 in (silver alginate) (Generic) Every Other Day/15 Days ary Discharge Instructions: Apply silver alginate to wound bed as instructed Secondary Dressing: Woven Gauze Sponge, Non-Sterile 4x4 in (Generic) Every Other Day/15 Days Discharge Instructions: Apply over primary dressing as directed. Secondary Dressing: ABD Pad, 5x9 (Generic) Every Other Day/15 Days Discharge Instructions: Apply over primary dressing as directed. Com pression Wrap: Kerlix Roll 4.5x3.1 (in/yd) (Generic) Every Other Day/15 Days Discharge Instructions: Apply Kerlix and Coban compression as directed. Com pression Wrap: Coban Self-Adherent Wrap 4x5 (in/yd) (Generic) Every Other Day/15 Days Discharge Instructions: Apply over Kerlix as directed. #1 the left foot does not look good. General erythema on the dorsal foot. Necrotic wounds over the dorsal ankle and left lateral fifth met head. His CT scan showed osteomyelitis I took a specimen of the bone this showed MRSA and Enterococcus. I am going to send him to see infectious disease however I was not going to delay antibiotics given the condition of the foot  in general 2. The left lateral tibial wound which was his original wound in this clinic for a long period of time actually looks smaller it is getting narrower. 3. The patient has moderate macrovascular disease but is not amenable to revascularization for vein and vascular. This may have a large amount to do with things here. Electronic Signature(s) Signed: 08/30/2020 4:50:17 PM By: Linton Ham MD Entered By: Linton Ham on 08/30/2020 11:05:21 -------------------------------------------------------------------------------- SuperBill Details Patient Name: Date of Service: [name] Foley F. 08/30/2020 Medical Record Number: [id number] Patient Account Number: [id number] Date of Birth/Sex: Treating RN: 06-Jun-1945 (75 y.o. [name] [name] Primary Care Provider: [name], [name] Other Clinician: Referring Provider: Treating Provider/Extender: Kazuto Sevey [name], [name] Weeks in Treatment: 228 Diagnosis Coding ICD-10 Codes Code Description E11.621 Type 2 diabetes mellitus with foot ulcer L97.518 Non-pressure chronic ulcer of other part of right foot with other specified severity L97.221 Non-pressure chronic ulcer of left calf limited to breakdown of skin L97.321 Non-pressure chronic ulcer of left ankle limited to breakdown of skin L97.521 Non-pressure chronic ulcer of other part of left foot limited to breakdown of skin L97.524 Non-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 M86.172 Other acute osteomyelitis, left ankle and foot Facility Procedures CPT4 Code: 10034961 Description: 11043 - DEB MUSC/FASCIA 20 SQ CM/< ICD-10 Diagnosis Description L97.524 Non-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 Modifier: Quantity: 1 Physician Procedures : CPT4 Code Description Modifier [id number] 11043 - WC PHYS DEBR MUSCLE/FASCIA 20 SQ CM ICD-10 Diagnosis Description L97.524 Non-pressure chronic ulcer of other part of left foot with  necrosis of bone Quantity: 1 Electronic Signature(s) Signed: 08/30/2020 4:50:17 PM By: Linton Ham MD Entered By: Linton Ham on 08/30/2020 11:05:42  Sween Lotion (Moisturizing lotion) Every Other Day/15 Days Discharge Instructions: Apply moisturizing lotion as directed Prim Dressing: KerraCel Ag Gelling Fiber Dressing, 4x5 in (silver alginate) (Generic) Every Other Day/15 Days ary Discharge Instructions: Apply silver alginate to wound bed as instructed Secondary Dressing: Woven Gauze Sponge, Non-Sterile 4x4 in (Generic) Every Other Day/15 Days Discharge Instructions: Apply over primary dressing as directed. Secondary Dressing: ABD Pad, 5x9 (Generic) Every Other Day/15 Days Discharge Instructions: Apply over primary dressing as directed. Com pression Wrap: Kerlix Roll 4.5x3.1 (in/yd) (Generic) Every Other Day/15 Days Discharge Instructions: Apply Kerlix and Coban compression as directed. Com pression Wrap: Coban Self-Adherent Wrap 4x5 (in/yd) (Generic) Every Other Day/15 Days Discharge Instructions: Apply over Kerlix as directed. #1 the left foot does not look good. General erythema on the dorsal foot. Necrotic wounds over the dorsal ankle and left lateral fifth met head. His CT scan showed osteomyelitis I took a specimen of the bone this showed MRSA and Enterococcus. I am going to send him to see infectious disease however I was not going to delay antibiotics given the condition of the foot  in general 2. The left lateral tibial wound which was his original wound in this clinic for a long period of time actually looks smaller it is getting narrower. 3. The patient has moderate macrovascular disease but is not amenable to revascularization for vein and vascular. This may have a large amount to do with things here. Electronic Signature(s) Signed: 08/30/2020 4:50:17 PM By: Linton Ham MD Entered By: Linton Ham on 08/30/2020 11:05:21 -------------------------------------------------------------------------------- SuperBill Details Patient Name: Date of Service: [name] Foley F. 08/30/2020 Medical Record Number: [id number] Patient Account Number: [id number] Date of Birth/Sex: Treating RN: 06-Jun-1945 (75 y.o. [name] [name] Primary Care Provider: [name], [name] Other Clinician: Referring Provider: Treating Provider/Extender: Kazuto Sevey [name], [name] Weeks in Treatment: 228 Diagnosis Coding ICD-10 Codes Code Description E11.621 Type 2 diabetes mellitus with foot ulcer L97.518 Non-pressure chronic ulcer of other part of right foot with other specified severity L97.221 Non-pressure chronic ulcer of left calf limited to breakdown of skin L97.321 Non-pressure chronic ulcer of left ankle limited to breakdown of skin L97.521 Non-pressure chronic ulcer of other part of left foot limited to breakdown of skin L97.524 Non-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 M86.172 Other acute osteomyelitis, left ankle and foot Facility Procedures CPT4 Code: 10034961 Description: 11043 - DEB MUSC/FASCIA 20 SQ CM/< ICD-10 Diagnosis Description L97.524 Non-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 Modifier: Quantity: 1 Physician Procedures : CPT4 Code Description Modifier [id number] 11043 - WC PHYS DEBR MUSCLE/FASCIA 20 SQ CM ICD-10 Diagnosis Description L97.524 Non-pressure chronic ulcer of other part of left foot with  necrosis of bone Quantity: 1 Electronic Signature(s) Signed: 08/30/2020 4:50:17 PM By: Linton Ham MD Entered By: Linton Ham on 08/30/2020 11:05:42  or cotton balls. Cleanser: Soap and Water Every Other Day/15 Days Discharge Instructions: May shower and wash wound with dial antibacterial soap and water prior to dressing change. Peri-Wound Care: Triamcinolone 15 (g) Every Other Day/15 Days Discharge Instructions: Use triamcinolone 15 (g) as directed Peri-Wound Care: Sween Lotion (Moisturizing lotion) Every Other Day/15 Days Discharge Instructions: Apply moisturizing lotion as directed Prim Dressing: KerraCel Ag Gelling Fiber Dressing, 4x5 in (silver alginate) (Generic) Every Other Day/15 Days ary Discharge Instructions: Apply silver alginate to wound bed as instructed Secondary Dressing: Woven Gauze Sponge, Non-Sterile 4x4 in (Generic) Every Other Day/15 Days Discharge Instructions: Apply over primary dressing as directed. Secondary Dressing: ABD Pad, 5x9 (Generic) Every Other Day/15 Days Discharge Instructions: Apply over primary dressing as directed. Compression Wrap: Kerlix Roll 4.5x3.1 (in/yd) (Generic) Every Other Day/15 Days Discharge Instructions: Apply Kerlix and Coban compression as directed. Compression Wrap: Coban Self-Adherent Wrap 4x5 (in/yd) (Generic) Every Other Day/15 Days Discharge Instructions: Apply over Kerlix as directed. Wound #36 - Foot Wound Laterality: Left, Lateral Cleanser: Wound Cleanser (Generic) Every Other Day/15 Days Discharge Instructions: Cleanse the wound with wound cleanser prior to applying a clean dressing using gauze sponges, not tissue or cotton balls. Cleanser: Soap and Water Every Other Day/15 Days Discharge Instructions: May shower and wash wound with dial antibacterial soap and water prior to dressing change. Peri-Wound Care: Triamcinolone 15 (g) Every Other Day/15 Days Discharge Instructions:  Use triamcinolone 15 (g) as directed Peri-Wound Care: Sween Lotion (Moisturizing lotion) Every Other Day/15 Days Discharge Instructions: Apply moisturizing lotion as directed Prim Dressing: KerraCel Ag Gelling Fiber Dressing, 4x5 in (silver alginate) (Generic) Every Other Day/15 Days ary Discharge Instructions: Apply silver alginate to wound bed as instructed Secondary Dressing: Woven Gauze Sponge, Non-Sterile 4x4 in (Generic) Every Other Day/15 Days Discharge Instructions: Apply over primary dressing as directed. Secondary Dressing: ABD Pad, 5x9 (Generic) Every Other Day/15 Days Discharge Instructions: Apply over primary dressing as directed. Compression Wrap: Kerlix Roll 4.5x3.1 (in/yd) (Generic) Every Other Day/15 Days Discharge Instructions: Apply Kerlix and Coban compression as directed. Compression Wrap: Coban Self-Adherent Wrap 4x5 (in/yd) (Generic) Every Other Day/15 Days Discharge Instructions: Apply over Kerlix as directed. 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 - (ICD10 E11.621 - Type 2 diabetes mellitus with foot ulcer) Electronic Signature(s) Signed: 08/30/2020 4:50:17 PM By: Linton Ham MD Signed: 08/30/2020 6:10:24 PM By: Deon Pilling Entered By: Deon Pilling on 08/30/2020 10:48:49 Prescription 08/30/2020 -------------------------------------------------------------------------------- [name] MD Patient Name: Provider: March 16, 1946 [id number] Date of Birth: NPI#: [name] [id number] Sex: DEA #: [phone] Phone #: License #: [place] Patient Address: [address] [place], [place] [address] [place], [place] [address] [phone] Allergies Sulfa Provider's Orders Infectious Disease - ICD10: E11.621 - refer [facility] related to osteomyelitis to left  foot along with positive PCR culture. Hand Signature: Date(s): Electronic Signature(s) Signed: 08/30/2020 4:50:17 PM By: Linton Ham MD Signed: 08/30/2020 6:10:24 PM By: Deon Pilling Entered By: Deon Pilling on 08/30/2020 10:48:54 -------------------------------------------------------------------------------- Problem List Details Patient Name: Date of Service: [name] Foley F. 08/30/2020 9:45 A M Medical Record Number: [id number] Patient Account Number: [id number] Date of Birth/Sex: Treating RN: November 09, 1945 (75 y.o. [name] [name] Primary Care Provider: [name], [name] Other Clinician: Referring Provider: Treating Provider/Extender: Andelyn Spade [name], [name] Weeks in Treatment: 228 Active Problems ICD-10 Encounter Code Description Active Date MDM  for 2-week follow-up. He has no complaints today. He finished his course of antibiotics scheduled at last visit for a culture that grew moderate strep group B. He has no new wounds today. His daughter continues to do his dressings 5/3; 2-week follow-up. Since he was last here he had a CT scan of his left foot that suggested osteomyelitis over the fifth MTP joint which is unfortunately what we expected to see. His daughter had called in late last week to say there was an odor  and exposed bone. I did not appreciate an odor and there was no exposed bone but there certainly was a necrotic surface. He also has areas on the left dorsal ankle right dorsal ankle left lateral lower leg He also saw Dr. Loletta Specter on 4/26. 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 He had a CO2 angiogram mostly had a brisk peroneal signal. It was felt that he would only have an option of an amputation from vascular surgery point of view. The patient and his daughter are very opposed to this although he is only minimally ambulatory 5/10; PCR culture of the left fifth met head bone showed predominantly MRSA and Enterococcus. I started him on Augmentin I am going to try to get linezolid through his insurance. He is allergic to sulfa and I believe the MRSA had tetracycline resistance genes. I am also going to get him seen by infectious disease. The left foot is particularly bad he has a necrotic wound on the left dorsal ankle with exposed tendon hyper granulated area below this and the necrotic area on the left fifth met head which was his most recent wound and then the original area on the left lateral lower leg which gradually, paradoxically is getting better. Electronic Signature(s) Signed: 08/30/2020 4:50:17 PM By: Linton Ham MD Entered By: Linton Ham on 08/30/2020 11:02:02 -------------------------------------------------------------------------------- Physical Exam Details Patient Name: Date of Service: [name] Foley F. 08/30/2020 9:45 A M Medical Record Number: [id number] Patient Account Number: [id number] Date of Birth/Sex: Treating RN: 06/08/1945 (75 y.o. [name] [name] Primary Care Provider: [name], [name] Other Clinician: Referring Provider: Treating Provider/Extender: Tomicka Lover [name], [name] Weeks in Treatment: 228 Constitutional Sitting or standing Blood Pressure is within target range for patient.. Pulse regular and within target range for  patient.Marland Kitchen Respirations regular, non-labored and within target range.. Temperature is normal and within the target range for the patient.Marland Kitchen Appears in no distress. Notes Wound exam Right lower extremity dorsal foot does not look too bad. Right dorsal foot and right lateral lower leg Much worse is the left. Necrotic wound over the lateral part of the fifth met head. Necrotic wound with exposed tendon in the dorsal ankle smaller area just below this which is hyper granulated I used a #15 scalpel and pickups to remove the tendon and debris from the surface of the skin Electronic Signature(s) Signed: 08/30/2020 4:50:17 PM By: Linton Ham MD Entered By: Linton Ham on 08/30/2020 11:03:27 -------------------------------------------------------------------------------- Physician Orders Details Patient Name: Date of Service: [name] Foley F. 08/30/2020 9:45 A M Medical Record Number: [id number] Patient Account Number: [id number] Date of Birth/Sex: Treating RN: [birth year] (75 y.o. [name] [name] Primary Care Provider: [name], [name] Other Clinician: Referring Provider: Treating Provider/Extender: Alaiza Yau [name], [name] Weeks in Treatment: 228 Verbal / Phone Orders: No Diagnosis Coding ICD-10 Coding Code Description E11.621 Type 2 diabetes mellitus with foot ulcer L97.518 Non-pressure chronic ulcer of other part of right foot with other specified  for 2-week follow-up. He has no complaints today. He finished his course of antibiotics scheduled at last visit for a culture that grew moderate strep group B. He has no new wounds today. His daughter continues to do his dressings 5/3; 2-week follow-up. Since he was last here he had a CT scan of his left foot that suggested osteomyelitis over the fifth MTP joint which is unfortunately what we expected to see. His daughter had called in late last week to say there was an odor  and exposed bone. I did not appreciate an odor and there was no exposed bone but there certainly was a necrotic surface. He also has areas on the left dorsal ankle right dorsal ankle left lateral lower leg He also saw Dr. Loletta Specter on 4/26. 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 He had a CO2 angiogram mostly had a brisk peroneal signal. It was felt that he would only have an option of an amputation from vascular surgery point of view. The patient and his daughter are very opposed to this although he is only minimally ambulatory 5/10; PCR culture of the left fifth met head bone showed predominantly MRSA and Enterococcus. I started him on Augmentin I am going to try to get linezolid through his insurance. He is allergic to sulfa and I believe the MRSA had tetracycline resistance genes. I am also going to get him seen by infectious disease. The left foot is particularly bad he has a necrotic wound on the left dorsal ankle with exposed tendon hyper granulated area below this and the necrotic area on the left fifth met head which was his most recent wound and then the original area on the left lateral lower leg which gradually, paradoxically is getting better. Electronic Signature(s) Signed: 08/30/2020 4:50:17 PM By: Linton Ham MD Entered By: Linton Ham on 08/30/2020 11:02:02 -------------------------------------------------------------------------------- Physical Exam Details Patient Name: Date of Service: [name] Foley F. 08/30/2020 9:45 A M Medical Record Number: [id number] Patient Account Number: [id number] Date of Birth/Sex: Treating RN: 06/08/1945 (75 y.o. [name] [name] Primary Care Provider: [name], [name] Other Clinician: Referring Provider: Treating Provider/Extender: Tomicka Lover [name], [name] Weeks in Treatment: 228 Constitutional Sitting or standing Blood Pressure is within target range for patient.. Pulse regular and within target range for  patient.Marland Kitchen Respirations regular, non-labored and within target range.. Temperature is normal and within the target range for the patient.Marland Kitchen Appears in no distress. Notes Wound exam Right lower extremity dorsal foot does not look too bad. Right dorsal foot and right lateral lower leg Much worse is the left. Necrotic wound over the lateral part of the fifth met head. Necrotic wound with exposed tendon in the dorsal ankle smaller area just below this which is hyper granulated I used a #15 scalpel and pickups to remove the tendon and debris from the surface of the skin Electronic Signature(s) Signed: 08/30/2020 4:50:17 PM By: Linton Ham MD Entered By: Linton Ham on 08/30/2020 11:03:27 -------------------------------------------------------------------------------- Physician Orders Details Patient Name: Date of Service: [name] Foley F. 08/30/2020 9:45 A M Medical Record Number: [id number] Patient Account Number: [id number] Date of Birth/Sex: Treating RN: [birth year] (75 y.o. [name] [name] Primary Care Provider: [name], [name] Other Clinician: Referring Provider: Treating Provider/Extender: Alaiza Yau [name], [name] Weeks in Treatment: 228 Verbal / Phone Orders: No Diagnosis Coding ICD-10 Coding Code Description E11.621 Type 2 diabetes mellitus with foot ulcer L97.518 Non-pressure chronic ulcer of other part of right foot with other specified  or cotton balls. Cleanser: Soap and Water Every Other Day/15 Days Discharge Instructions: May shower and wash wound with dial antibacterial soap and water prior to dressing change. Peri-Wound Care: Triamcinolone 15 (g) Every Other Day/15 Days Discharge Instructions: Use triamcinolone 15 (g) as directed Peri-Wound Care: Sween Lotion (Moisturizing lotion) Every Other Day/15 Days Discharge Instructions: Apply moisturizing lotion as directed Prim Dressing: KerraCel Ag Gelling Fiber Dressing, 4x5 in (silver alginate) (Generic) Every Other Day/15 Days ary Discharge Instructions: Apply silver alginate to wound bed as instructed Secondary Dressing: Woven Gauze Sponge, Non-Sterile 4x4 in (Generic) Every Other Day/15 Days Discharge Instructions: Apply over primary dressing as directed. Secondary Dressing: ABD Pad, 5x9 (Generic) Every Other Day/15 Days Discharge Instructions: Apply over primary dressing as directed. Compression Wrap: Kerlix Roll 4.5x3.1 (in/yd) (Generic) Every Other Day/15 Days Discharge Instructions: Apply Kerlix and Coban compression as directed. Compression Wrap: Coban Self-Adherent Wrap 4x5 (in/yd) (Generic) Every Other Day/15 Days Discharge Instructions: Apply over Kerlix as directed. Wound #36 - Foot Wound Laterality: Left, Lateral Cleanser: Wound Cleanser (Generic) Every Other Day/15 Days Discharge Instructions: Cleanse the wound with wound cleanser prior to applying a clean dressing using gauze sponges, not tissue or cotton balls. Cleanser: Soap and Water Every Other Day/15 Days Discharge Instructions: May shower and wash wound with dial antibacterial soap and water prior to dressing change. Peri-Wound Care: Triamcinolone 15 (g) Every Other Day/15 Days Discharge Instructions:  Use triamcinolone 15 (g) as directed Peri-Wound Care: Sween Lotion (Moisturizing lotion) Every Other Day/15 Days Discharge Instructions: Apply moisturizing lotion as directed Prim Dressing: KerraCel Ag Gelling Fiber Dressing, 4x5 in (silver alginate) (Generic) Every Other Day/15 Days ary Discharge Instructions: Apply silver alginate to wound bed as instructed Secondary Dressing: Woven Gauze Sponge, Non-Sterile 4x4 in (Generic) Every Other Day/15 Days Discharge Instructions: Apply over primary dressing as directed. Secondary Dressing: ABD Pad, 5x9 (Generic) Every Other Day/15 Days Discharge Instructions: Apply over primary dressing as directed. Compression Wrap: Kerlix Roll 4.5x3.1 (in/yd) (Generic) Every Other Day/15 Days Discharge Instructions: Apply Kerlix and Coban compression as directed. Compression Wrap: Coban Self-Adherent Wrap 4x5 (in/yd) (Generic) Every Other Day/15 Days Discharge Instructions: Apply over Kerlix as directed. 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 - (ICD10 E11.621 - Type 2 diabetes mellitus with foot ulcer) Electronic Signature(s) Signed: 08/30/2020 4:50:17 PM By: Linton Ham MD Signed: 08/30/2020 6:10:24 PM By: Deon Pilling Entered By: Deon Pilling on 08/30/2020 10:48:49 Prescription 08/30/2020 -------------------------------------------------------------------------------- [name] MD Patient Name: Provider: March 16, 1946 [id number] Date of Birth: NPI#: [name] [id number] Sex: DEA #: [phone] Phone #: License #: [place] Patient Address: [address] [place], [place] [address] [place], [place] [address] [phone] Allergies Sulfa Provider's Orders Infectious Disease - ICD10: E11.621 - refer [facility] Infectious Disease related to osteomyelitis to left  foot along with positive PCR culture. Hand Signature: Date(s): Electronic Signature(s) Signed: 08/30/2020 4:50:17 PM By: Linton Ham MD Signed: 08/30/2020 6:10:24 PM By: Deon Pilling Entered By: Deon Pilling on 08/30/2020 10:48:54 -------------------------------------------------------------------------------- Problem List Details Patient Name: Date of Service: [name] Foley F. 08/30/2020 9:45 A M Medical Record Number: [id number] Patient Account Number: [id number] Date of Birth/Sex: Treating RN: November 09, 1945 (75 y.o. [name] [name] Primary Care Provider: [name], [name] Other Clinician: Referring Provider: Treating Provider/Extender: Andelyn Spade [name], [name] Weeks in Treatment: 228 Active Problems ICD-10 Encounter Code Description Active Date MDM  or cotton balls. Cleanser: Soap and Water Every Other Day/15 Days Discharge Instructions: May shower and wash wound with dial antibacterial soap and water prior to dressing change. Peri-Wound Care: Triamcinolone 15 (g) Every Other Day/15 Days Discharge Instructions: Use triamcinolone 15 (g) as directed Peri-Wound Care: Sween Lotion (Moisturizing lotion) Every Other Day/15 Days Discharge Instructions: Apply moisturizing lotion as directed Prim Dressing: KerraCel Ag Gelling Fiber Dressing, 4x5 in (silver alginate) (Generic) Every Other Day/15 Days ary Discharge Instructions: Apply silver alginate to wound bed as instructed Secondary Dressing: Woven Gauze Sponge, Non-Sterile 4x4 in (Generic) Every Other Day/15 Days Discharge Instructions: Apply over primary dressing as directed. Secondary Dressing: ABD Pad, 5x9 (Generic) Every Other Day/15 Days Discharge Instructions: Apply over primary dressing as directed. Compression Wrap: Kerlix Roll 4.5x3.1 (in/yd) (Generic) Every Other Day/15 Days Discharge Instructions: Apply Kerlix and Coban compression as directed. Compression Wrap: Coban Self-Adherent Wrap 4x5 (in/yd) (Generic) Every Other Day/15 Days Discharge Instructions: Apply over Kerlix as directed. Wound #36 - Foot Wound Laterality: Left, Lateral Cleanser: Wound Cleanser (Generic) Every Other Day/15 Days Discharge Instructions: Cleanse the wound with wound cleanser prior to applying a clean dressing using gauze sponges, not tissue or cotton balls. Cleanser: Soap and Water Every Other Day/15 Days Discharge Instructions: May shower and wash wound with dial antibacterial soap and water prior to dressing change. Peri-Wound Care: Triamcinolone 15 (g) Every Other Day/15 Days Discharge Instructions:  Use triamcinolone 15 (g) as directed Peri-Wound Care: Sween Lotion (Moisturizing lotion) Every Other Day/15 Days Discharge Instructions: Apply moisturizing lotion as directed Prim Dressing: KerraCel Ag Gelling Fiber Dressing, 4x5 in (silver alginate) (Generic) Every Other Day/15 Days ary Discharge Instructions: Apply silver alginate to wound bed as instructed Secondary Dressing: Woven Gauze Sponge, Non-Sterile 4x4 in (Generic) Every Other Day/15 Days Discharge Instructions: Apply over primary dressing as directed. Secondary Dressing: ABD Pad, 5x9 (Generic) Every Other Day/15 Days Discharge Instructions: Apply over primary dressing as directed. Compression Wrap: Kerlix Roll 4.5x3.1 (in/yd) (Generic) Every Other Day/15 Days Discharge Instructions: Apply Kerlix and Coban compression as directed. Compression Wrap: Coban Self-Adherent Wrap 4x5 (in/yd) (Generic) Every Other Day/15 Days Discharge Instructions: Apply over Kerlix as directed. 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 - (ICD10 E11.621 - Type 2 diabetes mellitus with foot ulcer) Electronic Signature(s) Signed: 08/30/2020 4:50:17 PM By: Linton Ham MD Signed: 08/30/2020 6:10:24 PM By: Deon Pilling Entered By: Deon Pilling on 08/30/2020 10:48:49 Prescription 08/30/2020 -------------------------------------------------------------------------------- [name] MD Patient Name: Provider: March 16, 1946 [id number] Date of Birth: NPI#: [name] [id number] Sex: DEA #: [phone] Phone #: License #: [place] Patient Address: [address] [place], [place] [address] [place], [place] [address] [phone] Allergies Sulfa Provider's Orders Infectious Disease - ICD10: E11.621 - refer [facility] Infectious Disease related to osteomyelitis to left  foot along with positive PCR culture. Hand Signature: Date(s): Electronic Signature(s) Signed: 08/30/2020 4:50:17 PM By: Linton Ham MD Signed: 08/30/2020 6:10:24 PM By: Deon Pilling Entered By: Deon Pilling on 08/30/2020 10:48:54 -------------------------------------------------------------------------------- Problem List Details Patient Name: Date of Service: [name] Foley F. 08/30/2020 9:45 A M Medical Record Number: [id number] Patient Account Number: [id number] Date of Birth/Sex: Treating RN: November 09, 1945 (75 y.o. [name] [name] Primary Care Provider: [name], [name] Other Clinician: Referring Provider: Treating Provider/Extender: Andelyn Spade [name], [name] Weeks in Treatment: 228 Active Problems ICD-10 Encounter Code Description Active Date MDM  Sween Lotion (Moisturizing lotion) Every Other Day/15 Days Discharge Instructions: Apply moisturizing lotion as directed Prim Dressing: KerraCel Ag Gelling Fiber Dressing, 4x5 in (silver alginate) (Generic) Every Other Day/15 Days ary Discharge Instructions: Apply silver alginate to wound bed as instructed Secondary Dressing: Woven Gauze Sponge, Non-Sterile 4x4 in (Generic) Every Other Day/15 Days Discharge Instructions: Apply over primary dressing as directed. Secondary Dressing: ABD Pad, 5x9 (Generic) Every Other Day/15 Days Discharge Instructions: Apply over primary dressing as directed. Com pression Wrap: Kerlix Roll 4.5x3.1 (in/yd) (Generic) Every Other Day/15 Days Discharge Instructions: Apply Kerlix and Coban compression as directed. Com pression Wrap: Coban Self-Adherent Wrap 4x5 (in/yd) (Generic) Every Other Day/15 Days Discharge Instructions: Apply over Kerlix as directed. #1 the left foot does not look good. General erythema on the dorsal foot. Necrotic wounds over the dorsal ankle and left lateral fifth met head. His CT scan showed osteomyelitis I took a specimen of the bone this showed MRSA and Enterococcus. I am going to send him to see infectious disease however I was not going to delay antibiotics given the condition of the foot  in general 2. The left lateral tibial wound which was his original wound in this clinic for a long period of time actually looks smaller it is getting narrower. 3. The patient has moderate macrovascular disease but is not amenable to revascularization for vein and vascular. This may have a large amount to do with things here. Electronic Signature(s) Signed: 08/30/2020 4:50:17 PM By: Linton Ham MD Entered By: Linton Ham on 08/30/2020 11:05:21 -------------------------------------------------------------------------------- SuperBill Details Patient Name: Date of Service: [name] Foley F. 08/30/2020 Medical Record Number: [id number] Patient Account Number: [id number] Date of Birth/Sex: Treating RN: 06-Jun-1945 (75 y.o. [name] [name] Primary Care Provider: [name], [name] Other Clinician: Referring Provider: Treating Provider/Extender: Kazuto Sevey [name], [name] Weeks in Treatment: 228 Diagnosis Coding ICD-10 Codes Code Description E11.621 Type 2 diabetes mellitus with foot ulcer L97.518 Non-pressure chronic ulcer of other part of right foot with other specified severity L97.221 Non-pressure chronic ulcer of left calf limited to breakdown of skin L97.321 Non-pressure chronic ulcer of left ankle limited to breakdown of skin L97.521 Non-pressure chronic ulcer of other part of left foot limited to breakdown of skin L97.524 Non-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 M86.172 Other acute osteomyelitis, left ankle and foot Facility Procedures CPT4 Code: 10034961 Description: 11043 - DEB MUSC/FASCIA 20 SQ CM/< ICD-10 Diagnosis Description L97.524 Non-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 Modifier: Quantity: 1 Physician Procedures : CPT4 Code Description Modifier [id number] 11043 - WC PHYS DEBR MUSCLE/FASCIA 20 SQ CM ICD-10 Diagnosis Description L97.524 Non-pressure chronic ulcer of other part of left foot with  necrosis of bone Quantity: 1 Electronic Signature(s) Signed: 08/30/2020 4:50:17 PM By: Linton Ham MD Entered By: Linton Ham on 08/30/2020 11:05:42  Sween Lotion (Moisturizing lotion) Every Other Day/15 Days Discharge Instructions: Apply moisturizing lotion as directed Prim Dressing: KerraCel Ag Gelling Fiber Dressing, 4x5 in (silver alginate) (Generic) Every Other Day/15 Days ary Discharge Instructions: Apply silver alginate to wound bed as instructed Secondary Dressing: Woven Gauze Sponge, Non-Sterile 4x4 in (Generic) Every Other Day/15 Days Discharge Instructions: Apply over primary dressing as directed. Secondary Dressing: ABD Pad, 5x9 (Generic) Every Other Day/15 Days Discharge Instructions: Apply over primary dressing as directed. Com pression Wrap: Kerlix Roll 4.5x3.1 (in/yd) (Generic) Every Other Day/15 Days Discharge Instructions: Apply Kerlix and Coban compression as directed. Com pression Wrap: Coban Self-Adherent Wrap 4x5 (in/yd) (Generic) Every Other Day/15 Days Discharge Instructions: Apply over Kerlix as directed. #1 the left foot does not look good. General erythema on the dorsal foot. Necrotic wounds over the dorsal ankle and left lateral fifth met head. His CT scan showed osteomyelitis I took a specimen of the bone this showed MRSA and Enterococcus. I am going to send him to see infectious disease however I was not going to delay antibiotics given the condition of the foot  in general 2. The left lateral tibial wound which was his original wound in this clinic for a long period of time actually looks smaller it is getting narrower. 3. The patient has moderate macrovascular disease but is not amenable to revascularization for vein and vascular. This may have a large amount to do with things here. Electronic Signature(s) Signed: 08/30/2020 4:50:17 PM By: Linton Ham MD Entered By: Linton Ham on 08/30/2020 11:05:21 -------------------------------------------------------------------------------- SuperBill Details Patient Name: Date of Service: [name] Foley F. 08/30/2020 Medical Record Number: [id number] Patient Account Number: [id number] Date of Birth/Sex: Treating RN: 06-Jun-1945 (75 y.o. [name] [name] Primary Care Provider: [name], [name] Other Clinician: Referring Provider: Treating Provider/Extender: Kazuto Sevey [name], [name] Weeks in Treatment: 228 Diagnosis Coding ICD-10 Codes Code Description E11.621 Type 2 diabetes mellitus with foot ulcer L97.518 Non-pressure chronic ulcer of other part of right foot with other specified severity L97.221 Non-pressure chronic ulcer of left calf limited to breakdown of skin L97.321 Non-pressure chronic ulcer of left ankle limited to breakdown of skin L97.521 Non-pressure chronic ulcer of other part of left foot limited to breakdown of skin L97.524 Non-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 M86.172 Other acute osteomyelitis, left ankle and foot Facility Procedures CPT4 Code: 10034961 Description: 11043 - DEB MUSC/FASCIA 20 SQ CM/< ICD-10 Diagnosis Description L97.524 Non-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 Modifier: Quantity: 1 Physician Procedures : CPT4 Code Description Modifier [id number] 11043 - WC PHYS DEBR MUSCLE/FASCIA 20 SQ CM ICD-10 Diagnosis Description L97.524 Non-pressure chronic ulcer of other part of left foot with  necrosis of bone Quantity: 1 Electronic Signature(s) Signed: 08/30/2020 4:50:17 PM By: Linton Ham MD Entered By: Linton Ham on 08/30/2020 11:05:42  or cotton balls. Cleanser: Soap and Water Every Other Day/15 Days Discharge Instructions: May shower and wash wound with dial antibacterial soap and water prior to dressing change. Peri-Wound Care: Triamcinolone 15 (g) Every Other Day/15 Days Discharge Instructions: Use triamcinolone 15 (g) as directed Peri-Wound Care: Sween Lotion (Moisturizing lotion) Every Other Day/15 Days Discharge Instructions: Apply moisturizing lotion as directed Prim Dressing: KerraCel Ag Gelling Fiber Dressing, 4x5 in (silver alginate) (Generic) Every Other Day/15 Days ary Discharge Instructions: Apply silver alginate to wound bed as instructed Secondary Dressing: Woven Gauze Sponge, Non-Sterile 4x4 in (Generic) Every Other Day/15 Days Discharge Instructions: Apply over primary dressing as directed. Secondary Dressing: ABD Pad, 5x9 (Generic) Every Other Day/15 Days Discharge Instructions: Apply over primary dressing as directed. Compression Wrap: Kerlix Roll 4.5x3.1 (in/yd) (Generic) Every Other Day/15 Days Discharge Instructions: Apply Kerlix and Coban compression as directed. Compression Wrap: Coban Self-Adherent Wrap 4x5 (in/yd) (Generic) Every Other Day/15 Days Discharge Instructions: Apply over Kerlix as directed. Wound #36 - Foot Wound Laterality: Left, Lateral Cleanser: Wound Cleanser (Generic) Every Other Day/15 Days Discharge Instructions: Cleanse the wound with wound cleanser prior to applying a clean dressing using gauze sponges, not tissue or cotton balls. Cleanser: Soap and Water Every Other Day/15 Days Discharge Instructions: May shower and wash wound with dial antibacterial soap and water prior to dressing change. Peri-Wound Care: Triamcinolone 15 (g) Every Other Day/15 Days Discharge Instructions:  Use triamcinolone 15 (g) as directed Peri-Wound Care: Sween Lotion (Moisturizing lotion) Every Other Day/15 Days Discharge Instructions: Apply moisturizing lotion as directed Prim Dressing: KerraCel Ag Gelling Fiber Dressing, 4x5 in (silver alginate) (Generic) Every Other Day/15 Days ary Discharge Instructions: Apply silver alginate to wound bed as instructed Secondary Dressing: Woven Gauze Sponge, Non-Sterile 4x4 in (Generic) Every Other Day/15 Days Discharge Instructions: Apply over primary dressing as directed. Secondary Dressing: ABD Pad, 5x9 (Generic) Every Other Day/15 Days Discharge Instructions: Apply over primary dressing as directed. Compression Wrap: Kerlix Roll 4.5x3.1 (in/yd) (Generic) Every Other Day/15 Days Discharge Instructions: Apply Kerlix and Coban compression as directed. Compression Wrap: Coban Self-Adherent Wrap 4x5 (in/yd) (Generic) Every Other Day/15 Days Discharge Instructions: Apply over Kerlix as directed. 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 - (ICD10 E11.621 - Type 2 diabetes mellitus with foot ulcer) Electronic Signature(s) Signed: 08/30/2020 4:50:17 PM By: Linton Ham MD Signed: 08/30/2020 6:10:24 PM By: Deon Pilling Entered By: Deon Pilling on 08/30/2020 10:48:49 Prescription 08/30/2020 -------------------------------------------------------------------------------- [name] MD Patient Name: Provider: March 16, 1946 [id number] Date of Birth: NPI#: [name] [id number] Sex: DEA #: [phone] Phone #: License #: [place] Patient Address: [address] [place], [place] [address] [place], [place] [address] [phone] Allergies Sulfa Provider's Orders Infectious Disease - ICD10: E11.621 - refer [facility] Infectious Disease related to osteomyelitis to left  foot along with positive PCR culture. Hand Signature: Date(s): Electronic Signature(s) Signed: 08/30/2020 4:50:17 PM By: Linton Ham MD Signed: 08/30/2020 6:10:24 PM By: Deon Pilling Entered By: Deon Pilling on 08/30/2020 10:48:54 -------------------------------------------------------------------------------- Problem List Details Patient Name: Date of Service: [name] Foley F. 08/30/2020 9:45 A M Medical Record Number: [id number] Patient Account Number: [id number] Date of Birth/Sex: Treating RN: November 09, 1945 (75 y.o. [name] [name] Primary Care Provider: [name], [name] Other Clinician: Referring Provider: Treating Provider/Extender: Andelyn Spade [name], [name] Weeks in Treatment: 228 Active Problems ICD-10 Encounter Code Description Active Date MDM  or cotton balls. Cleanser: Soap and Water Every Other Day/15 Days Discharge Instructions: May shower and wash wound with dial antibacterial soap and water prior to dressing change. Peri-Wound Care: Triamcinolone 15 (g) Every Other Day/15 Days Discharge Instructions: Use triamcinolone 15 (g) as directed Peri-Wound Care: Sween Lotion (Moisturizing lotion) Every Other Day/15 Days Discharge Instructions: Apply moisturizing lotion as directed Prim Dressing: KerraCel Ag Gelling Fiber Dressing, 4x5 in (silver alginate) (Generic) Every Other Day/15 Days ary Discharge Instructions: Apply silver alginate to wound bed as instructed Secondary Dressing: Woven Gauze Sponge, Non-Sterile 4x4 in (Generic) Every Other Day/15 Days Discharge Instructions: Apply over primary dressing as directed. Secondary Dressing: ABD Pad, 5x9 (Generic) Every Other Day/15 Days Discharge Instructions: Apply over primary dressing as directed. Compression Wrap: Kerlix Roll 4.5x3.1 (in/yd) (Generic) Every Other Day/15 Days Discharge Instructions: Apply Kerlix and Coban compression as directed. Compression Wrap: Coban Self-Adherent Wrap 4x5 (in/yd) (Generic) Every Other Day/15 Days Discharge Instructions: Apply over Kerlix as directed. Wound #36 - Foot Wound Laterality: Left, Lateral Cleanser: Wound Cleanser (Generic) Every Other Day/15 Days Discharge Instructions: Cleanse the wound with wound cleanser prior to applying a clean dressing using gauze sponges, not tissue or cotton balls. Cleanser: Soap and Water Every Other Day/15 Days Discharge Instructions: May shower and wash wound with dial antibacterial soap and water prior to dressing change. Peri-Wound Care: Triamcinolone 15 (g) Every Other Day/15 Days Discharge Instructions:  Use triamcinolone 15 (g) as directed Peri-Wound Care: Sween Lotion (Moisturizing lotion) Every Other Day/15 Days Discharge Instructions: Apply moisturizing lotion as directed Prim Dressing: KerraCel Ag Gelling Fiber Dressing, 4x5 in (silver alginate) (Generic) Every Other Day/15 Days ary Discharge Instructions: Apply silver alginate to wound bed as instructed Secondary Dressing: Woven Gauze Sponge, Non-Sterile 4x4 in (Generic) Every Other Day/15 Days Discharge Instructions: Apply over primary dressing as directed. Secondary Dressing: ABD Pad, 5x9 (Generic) Every Other Day/15 Days Discharge Instructions: Apply over primary dressing as directed. Compression Wrap: Kerlix Roll 4.5x3.1 (in/yd) (Generic) Every Other Day/15 Days Discharge Instructions: Apply Kerlix and Coban compression as directed. Compression Wrap: Coban Self-Adherent Wrap 4x5 (in/yd) (Generic) Every Other Day/15 Days Discharge Instructions: Apply over Kerlix as directed. 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 - (ICD10 E11.621 - Type 2 diabetes mellitus with foot ulcer) Electronic Signature(s) Signed: 08/30/2020 4:50:17 PM By: Linton Ham MD Signed: 08/30/2020 6:10:24 PM By: Deon Pilling Entered By: Deon Pilling on 08/30/2020 10:48:49 Prescription 08/30/2020 -------------------------------------------------------------------------------- [name] MD Patient Name: Provider: March 16, 1946 [id number] Date of Birth: NPI#: [name] [id number] Sex: DEA #: [phone] [id number] Phone #: License #: [place] Patient Address: [address] [place], [place] [address] [place], [place] [address] [phone] Allergies Sulfa Provider's Orders Infectious Disease - ICD10: E11.621 - refer [facility] Infectious Disease related to osteomyelitis to left  foot along with positive PCR culture. Hand Signature: Date(s): Electronic Signature(s) Signed: 08/30/2020 4:50:17 PM By: Linton Ham MD Signed: 08/30/2020 6:10:24 PM By: Deon Pilling Entered By: Deon Pilling on 08/30/2020 10:48:54 -------------------------------------------------------------------------------- Problem List Details Patient Name: Date of Service: [name] Foley F. 08/30/2020 9:45 A M Medical Record Number: [id number] Patient Account Number: [id number] Date of Birth/Sex: Treating RN: November 09, 1945 (75 y.o. [name] [name] Primary Care Provider: [name], [name] Other Clinician: Referring Provider: Treating Provider/Extender: Andelyn Spade [name], [name] Weeks in Treatment: 228 Active Problems ICD-10 Encounter Code Description Active Date MDM

## 2020-08-31 ENCOUNTER — Other Ambulatory Visit: Payer: Self-pay | Admitting: Family Medicine

## 2020-08-31 DIAGNOSIS — F320 Major depressive disorder, single episode, mild: Secondary | ICD-10-CM

## 2020-08-31 NOTE — Progress Notes (Signed)
No Joint: No Joint: No Bone: No Bone: No Large (67-100%) N/A Small (1-33%) Epithelialization: N/A N/A N/A Debridement: N/A N/A N/A Pain Control: N/A N/A N/A Tissue Debrided: N/A N/A N/A Level: N/A N/A N/A Debridement A (sq cm): rea N/A N/A N/A Instrument: N/A N/A N/A Bleeding: N/A  N/A N/A Hemostasis Achieved: N/A N/A N/A Procedural Pain: N/A N/A N/A Post Procedural Pain: N/A N/A N/A Debridement Treatment Response: N/A N/A N/A Post Debridement Measurements L x W x D (cm) N/A N/A N/A Post Debridement Volume: (cm) N/A N/A N/A Procedures Performed: Wound Number: [id number] N/A Photos: No Photos No Photos N/A Left, Lateral, Dorsal Foot Left, Lateral Foot N/A Wound Location: Gradually Appeared Gradually Appeared N/A Wounding Event: 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 N/A Primary Etiology: Extremity Anemia, Sleep Apnea, Arrhythmia, Anemia, Sleep Apnea, Arrhythmia, N/A Comorbid History: Congestive Heart Failure, Coronary Congestive Heart Failure, Coronary Artery Disease, Hypertension, Artery Disease, Hypertension, Myocardial Infarction, Type II Myocardial Infarction, Type II Diabetes, Gout, Neuropathy Diabetes, Gout, Neuropathy 05/24/2020 07/19/2020 N/A Date Acquired: 11 5 N/A Weeks of Treatment: Open Open N/A Wound Status: No No N/A Clustered Wound: N/A N/A N/A Clustered Quantity: 0.7x1.5x0.7 2.5x2.2x1.1 N/A Measurements L x W x D (cm) 0.825 4.32 N/A A (cm) : rea 0.577 4.752 N/A Volume (cm) : -75.20% -0.70% N/A % Reduction in Area: -144.50% -453.80% N/A % Reduction in Volume: Grade 2 Full Thickness Without Exposed N/A Classification: Support Structures Medium Medium N/A Exudate A mount: Purulent Serosanguineous N/A Exudate Type: yellow, brown, green red, brown N/A Exudate Color: No Yes N/A Foul Odor A Cleansing: fter N/A No N/A Odor Anticipated Due to Product Use: Distinct, outline attached Flat and Intact N/A Wound Margin: Medium (34-66%) None Present (0%) N/A Granulation A mount: Red, Hyper-granulation N/A N/A Granulation Quality: Medium (34-66%) Large (67-100%) N/A Necrotic Amount: Adherent Slough Eschar, Adherent Slough N/A Necrotic Tissue: Fat Layer (Subcutaneous Tissue): Yes Fat Layer (Subcutaneous Tissue):  Yes N/A Exposed Structures: Bone: Yes Fascia: No Fascia: No Tendon: No Tendon: No Muscle: No Muscle: No Joint: No Joint: No Bone: No None None N/A Epithelialization: N/A N/A N/A Debridement: N/A N/A N/A Pain Control: N/A N/A N/A Tissue Debrided: N/A N/A N/A Level: N/A N/A N/A Debridement A (sq cm): rea N/A N/A N/A Instrument: N/A N/A N/A Bleeding: N/A N/A N/A Hemostasis A chieved: N/A N/A N/A Procedural Pain: N/A N/A N/A Post Procedural Pain: Debridement Treatment Response: N/A N/A N/A Post Debridement Measurements L x N/A N/A N/A W x D (cm) N/A N/A N/A Post Debridement Volume: (cm) N/A N/A N/A Procedures Performed: Treatment Notes Electronic Signature(s) Signed: 08/30/2020 4:50:17 PM By: Linton Ham MD Signed: 08/30/2020 6:10:24 PM By: Deon Pilling Entered By: Linton Ham on 08/30/2020 11:00:39 -------------------------------------------------------------------------------- Multi-Disciplinary Care Plan Details Patient Name: Date of Service: [name] Foley F. 08/30/2020 9:45 A M Medical Record Number: [id number] Patient Account Number: [id number] Date of Birth/Sex: Treating RN: 05-Nov-1945 (75 y.o. [name] [name] Primary Care Jerred Zaremba: [name], [name] Other Clinician: Referring Amberlie Gaillard: Treating Chevez Sambrano/Extender: [name], [name] [name], [name] Weeks in Treatment: [place] reviewed with physician Active Inactive Wound/Skin Impairment Nursing Diagnoses: Impaired tissue integrity Goals: Patient/caregiver will verbalize understanding of skin care regimen Date Initiated: 11/19/2017 Target Resolution Date: 10/21/2020 Goal Status: Active Ulcer/skin breakdown will have a volume reduction of 30% by week 4 Date Initiated: 11/19/2017 Date Inactivated: 12/02/2017 Target Resolution Date: 12/22/2017 Goal Status: Unmet Unmet Reason: multipl comorbidities Interventions: Assess patient/caregiver ability to obtain necessary  supplies Assess ulceration(s) every  Exposed: No Muscle Exposed: No Joint Exposed: No Bone Exposed: No Treatment Notes Wound #36 (Foot) Wound Laterality: Left, Lateral Cleanser Wound Cleanser Discharge Instruction: Cleanse the wound with wound cleanser prior to applying a clean dressing using gauze sponges, not tissue or cotton balls. Soap and Water Discharge Instruction: May shower and wash wound with dial antibacterial soap and water prior to dressing change. Peri-Wound Care Triamcinolone 15 (g) Discharge Instruction: Use triamcinolone 15 (g) as directed Sween Lotion (Moisturizing lotion) Discharge Instruction: Apply moisturizing lotion as directed Topical Primary Dressing KerraCel Ag Gelling Fiber Dressing, 4x5 in (silver alginate) Discharge Instruction: Apply silver alginate to wound bed as instructed Secondary Dressing Woven Gauze Sponge, Non-Sterile 4x4 in Discharge Instruction: Apply over primary dressing as directed. ABD Pad, 5x9 Discharge Instruction: Apply over primary dressing as directed. Secured With Compression Wrap Kerlix Roll 4.5x3.1 (in/yd) Discharge Instruction: Apply Kerlix and Coban compression as directed. Coban Self-Adherent Wrap 4x5 (in/yd) Discharge Instruction: Apply over Kerlix as directed. Compression Stockings Add-Ons Electronic Signature(s) Signed: 08/30/2020 6:10:24 PM By: Deon Pilling Signed: 08/31/2020 9:46:23 AM By: Sandre Kitty Entered By: Sandre Kitty on 08/30/2020 16:27:17 -------------------------------------------------------------------------------- Vitals Details Patient Name: Date of Service: [name] Foley F. 08/30/2020 9:45 A M Medical Record Number: [id number] Patient Account Number: [id number] Date of Birth/Sex: Treating RN: January 05, 1946 (75 y.o. [name] [name] Primary Care Imran Nuon: [name], [name] Other Clinician: Referring Pam Vanalstine: Treating Alexza Norbeck/Extender: [name], [name] [name], [name] Weeks in Treatment: 228 Vital Signs Time Taken:  10:20 Temperature (F): 98.1 Height (in): 74 Pulse (bpm): 78 Weight (lbs): 150 Respiratory Rate (breaths/min): 18 Body Mass Index (BMI): 19.3 Blood Pressure (mmHg): 116/66 Capillary Blood Glucose (mg/dl): 124 Reference Range: 80 - 120 mg / dl Electronic Signature(s) Signed: 08/31/2020 9:46:23 AM By: Sandre Kitty Entered By: Sandre Kitty on 08/30/2020 10:20:35  Instruction: Apply over Kerlix as directed. Compression Stockings Add-Ons Electronic Signature(s) Signed: 08/30/2020 6:10:24 PM By: Deon Pilling Signed: 08/31/2020 9:46:23 AM By: Sandre Kitty Entered By: Sandre Kitty on 08/30/2020 16:28:10 -------------------------------------------------------------------------------- Wound Assessment Details Patient Name: Date of Service: [name] Foley F. 08/30/2020 9:45 A M Medical Record Number: [id number] Patient Account Number: [id number] Date of Birth/Sex: Treating RN: Sep 01, 1945 (74 y.o. [name] [name] Primary Care Mariadejesus Cade: [name], [name] Other Clinician: Referring Anthonyjames Bargar: Treating Ajeenah Heiny/Extender: [name], [name] [name], [name] Weeks in Treatment: 228 Wound Status Wound Number: 25 Primary Venous Leg Ulcer Etiology: Wound Location: Right, Lateral Lower Leg Wound Open Wounding Event: Gradually Appeared Status: Date Acquired: 07/06/2019 Comorbid Anemia, Sleep Apnea, Arrhythmia, Congestive Heart Failure, Weeks Of Treatment: 58 History: Coronary Artery  Disease, Hypertension, Myocardial Infarction, Clustered Wound: No Type II Diabetes, Gout, Neuropathy Photos Wound Measurements Length: (cm) 2.1 Width: (cm) 0.3 Depth: (cm) 0.1 Area: (cm) 0.495 Volume: (cm) 0.049 % Reduction in Area: 81.9% % Reduction in Volume: 82.1% Epithelialization: Medium (34-66%) Tunneling: No Undermining: No Wound Description Classification: Full Thickness Without Exposed Support Structures Wound Margin: Flat and Intact Exudate Amount: Medium Exudate Type: Serosanguineous Exudate Color: red, brown Foul Odor After Cleansing: No Slough/Fibrino No Wound Bed Granulation Amount: Large (67-100%) Exposed Structure Granulation Quality: Red Fascia Exposed: No Necrotic Amount: None Present (0%) Fat Layer (Subcutaneous Tissue) Exposed: Yes Tendon Exposed: No Muscle Exposed: No Joint Exposed: No Bone Exposed: No Treatment Notes Wound #25 (Lower Leg) Wound Laterality: Right, Lateral Cleanser Wound Cleanser Discharge Instruction: Cleanse the wound with wound cleanser prior to applying a clean dressing using gauze sponges, not tissue or cotton balls. Soap and Water Discharge Instruction: May shower and wash wound with dial antibacterial soap and water prior to dressing change. Peri-Wound Care Triamcinolone 15 (g) Discharge Instruction: Use triamcinolone 15 (g) as directed Sween Lotion (Moisturizing lotion) Discharge Instruction: Apply moisturizing lotion as directed Topical Primary Dressing KerraCel Ag Gelling Fiber Dressing, 4x5 in (silver alginate) Discharge Instruction: Apply silver alginate to wound bed as instructed Secondary Dressing Woven Gauze Sponge, Non-Sterile 4x4 in Discharge Instruction: Apply over primary dressing as directed. ABD Pad, 5x9 Discharge Instruction: Apply over primary dressing as directed. Secured With Compression Wrap Kerlix Roll 4.5x3.1 (in/yd) Discharge Instruction: Apply Kerlix and Coban compression as directed. Coban  Self-Adherent Wrap 4x5 (in/yd) Discharge Instruction: Apply over Kerlix as directed. Compression Stockings Add-Ons Electronic Signature(s) Signed: 08/30/2020 6:10:24 PM By: Deon Pilling Signed: 08/31/2020 9:46:23 AM By: Sandre Kitty Entered By: Sandre Kitty on 08/30/2020 16:26:51 -------------------------------------------------------------------------------- Wound Assessment Details Patient Name: Date of Service: [name] Foley F. 08/30/2020 9:45 A M Medical Record Number: [id number] Patient Account Number: [id number] Date of Birth/Sex: Treating RN: 1945/08/12 (75 y.o. [name] [name] Primary Care Huxley Shurley: [name], [name] Other Clinician: Referring Keily Lepp: Treating Crystalynn Mcinerney/Extender: [name], [name] [name], [name] Weeks in Treatment: 228 Wound Status Wound Number: 26 Primary Diabetic Wound/Ulcer of the Lower Extremity Etiology: Wound Location: Left, Dorsal Foot Wound Open Wounding Event: Gradually Appeared Status: Date Acquired: 08/18/2019 Comorbid Anemia, Sleep Apnea, Arrhythmia, Congestive Heart Failure, Weeks Of Treatment: 54 History: Coronary Artery Disease, Hypertension, Myocardial Infarction, Clustered Wound: Yes Type II Diabetes, Gout, Neuropathy Photos Wound Measurements Length: (cm) 2.5 Width: (cm) 1.3 Depth: (cm) 0.5 Clustered Quantity: 1 Area: (cm) 2.553 Volume: (cm) 1.276 % Reduction in Area: -364.2% % Reduction in Volume: -2220% Epithelialization: None Tunneling: No Undermining: No Wound Description Classification: Grade 2 Wound Margin: Distinct, outline attached Exudate Amount: Medium Exudate Type: Purulent Exudate Color: yellow, brown, green Foul Odor  Instruction: Apply over Kerlix as directed. Compression Stockings Add-Ons Electronic Signature(s) Signed: 08/30/2020 6:10:24 PM By: Deon Pilling Signed: 08/31/2020 9:46:23 AM By: Sandre Kitty Entered By: Sandre Kitty on 08/30/2020 16:28:10 -------------------------------------------------------------------------------- Wound Assessment Details Patient Name: Date of Service: [name] Foley F. 08/30/2020 9:45 A M Medical Record Number: [id number] Patient Account Number: [id number] Date of Birth/Sex: Treating RN: Sep 01, 1945 (74 y.o. [name] [name] Primary Care Mariadejesus Cade: [name], [name] Other Clinician: Referring Anthonyjames Bargar: Treating Ajeenah Heiny/Extender: [name], [name] [name], [name] Weeks in Treatment: 228 Wound Status Wound Number: 25 Primary Venous Leg Ulcer Etiology: Wound Location: Right, Lateral Lower Leg Wound Open Wounding Event: Gradually Appeared Status: Date Acquired: 07/06/2019 Comorbid Anemia, Sleep Apnea, Arrhythmia, Congestive Heart Failure, Weeks Of Treatment: 58 History: Coronary Artery  Disease, Hypertension, Myocardial Infarction, Clustered Wound: No Type II Diabetes, Gout, Neuropathy Photos Wound Measurements Length: (cm) 2.1 Width: (cm) 0.3 Depth: (cm) 0.1 Area: (cm) 0.495 Volume: (cm) 0.049 % Reduction in Area: 81.9% % Reduction in Volume: 82.1% Epithelialization: Medium (34-66%) Tunneling: No Undermining: No Wound Description Classification: Full Thickness Without Exposed Support Structures Wound Margin: Flat and Intact Exudate Amount: Medium Exudate Type: Serosanguineous Exudate Color: red, brown Foul Odor After Cleansing: No Slough/Fibrino No Wound Bed Granulation Amount: Large (67-100%) Exposed Structure Granulation Quality: Red Fascia Exposed: No Necrotic Amount: None Present (0%) Fat Layer (Subcutaneous Tissue) Exposed: Yes Tendon Exposed: No Muscle Exposed: No Joint Exposed: No Bone Exposed: No Treatment Notes Wound #25 (Lower Leg) Wound Laterality: Right, Lateral Cleanser Wound Cleanser Discharge Instruction: Cleanse the wound with wound cleanser prior to applying a clean dressing using gauze sponges, not tissue or cotton balls. Soap and Water Discharge Instruction: May shower and wash wound with dial antibacterial soap and water prior to dressing change. Peri-Wound Care Triamcinolone 15 (g) Discharge Instruction: Use triamcinolone 15 (g) as directed Sween Lotion (Moisturizing lotion) Discharge Instruction: Apply moisturizing lotion as directed Topical Primary Dressing KerraCel Ag Gelling Fiber Dressing, 4x5 in (silver alginate) Discharge Instruction: Apply silver alginate to wound bed as instructed Secondary Dressing Woven Gauze Sponge, Non-Sterile 4x4 in Discharge Instruction: Apply over primary dressing as directed. ABD Pad, 5x9 Discharge Instruction: Apply over primary dressing as directed. Secured With Compression Wrap Kerlix Roll 4.5x3.1 (in/yd) Discharge Instruction: Apply Kerlix and Coban compression as directed. Coban  Self-Adherent Wrap 4x5 (in/yd) Discharge Instruction: Apply over Kerlix as directed. Compression Stockings Add-Ons Electronic Signature(s) Signed: 08/30/2020 6:10:24 PM By: Deon Pilling Signed: 08/31/2020 9:46:23 AM By: Sandre Kitty Entered By: Sandre Kitty on 08/30/2020 16:26:51 -------------------------------------------------------------------------------- Wound Assessment Details Patient Name: Date of Service: [name] Foley F. 08/30/2020 9:45 A M Medical Record Number: [id number] Patient Account Number: [id number] Date of Birth/Sex: Treating RN: 1945/08/12 (75 y.o. [name] [name] Primary Care Huxley Shurley: [name], [name] Other Clinician: Referring Keily Lepp: Treating Crystalynn Mcinerney/Extender: [name], [name] [name], [name] Weeks in Treatment: 228 Wound Status Wound Number: 26 Primary Diabetic Wound/Ulcer of the Lower Extremity Etiology: Wound Location: Left, Dorsal Foot Wound Open Wounding Event: Gradually Appeared Status: Date Acquired: 08/18/2019 Comorbid Anemia, Sleep Apnea, Arrhythmia, Congestive Heart Failure, Weeks Of Treatment: 54 History: Coronary Artery Disease, Hypertension, Myocardial Infarction, Clustered Wound: Yes Type II Diabetes, Gout, Neuropathy Photos Wound Measurements Length: (cm) 2.5 Width: (cm) 1.3 Depth: (cm) 0.5 Clustered Quantity: 1 Area: (cm) 2.553 Volume: (cm) 1.276 % Reduction in Area: -364.2% % Reduction in Volume: -2220% Epithelialization: None Tunneling: No Undermining: No Wound Description Classification: Grade 2 Wound Margin: Distinct, outline attached Exudate Amount: Medium Exudate Type: Purulent Exudate Color: yellow, brown, green Foul Odor  visit Provide education on ulcer and skin care Notes: Electronic Signature(s) Signed: 08/30/2020 6:10:24 PM By: Deon Pilling Entered By: Deon Pilling on 08/30/2020 10:01:15 -------------------------------------------------------------------------------- Pain Assessment Details Patient Name: Date of Service: [name] Foley F. 08/30/2020 9:45 A M Medical Record Number: [id number] Patient Account Number: [id number] Date of Birth/Sex: Treating RN: 1946/01/09 (75 y.o. [name] [name] Primary Care Sixto Bowdish: [name], [name] Other Clinician: Referring Skylee Baird: Treating Dermot Gremillion/Extender: [name], [name] [name], [name] Weeks in Treatment: 228 Active Problems Location of Pain Severity and Description of Pain Patient Has Paino Yes Site Locations Rate the pain. Rate the pain. Current Pain Level: 3 Pain Management and Medication Current Pain Management: Electronic Signature(s) Signed: 08/30/2020 6:10:24 PM By: Deon Pilling Signed: 08/31/2020 9:46:23 AM By: Sandre Kitty Entered By: Sandre Kitty on 08/30/2020 10:20:44 -------------------------------------------------------------------------------- Patient/Caregiver Education Details Patient Name: Date of Service: [name] [name] 5/10/2022andnbsp9:45 A M Medical Record Number: [id number] Patient Account Number: [id number] Date of Birth/Gender: Treating RN: 31-Oct-1945 (75 y.o. [name] [name] Primary Care Physician: [name], [name] Other Clinician: Referring Physician: Treating Physician/Extender: [name], [name] [name], [name] Weeks in Treatment: 228 Education Assessment Education Provided To: Patient Education Topics Provided Wound/Skin Impairment: Handouts: Skin Care Do's and Dont's Methods: Explain/Verbal Responses: Reinforcements needed Electronic Signature(s) Signed: 08/30/2020 6:10:24 PM By: Deon Pilling Entered By: Deon Pilling on 08/30/2020  10:03:40 -------------------------------------------------------------------------------- Wound Assessment Details Patient Name: Date of Service: [name] Foley F. 08/30/2020 9:45 A M Medical Record Number: [id number] Patient Account Number: [id number] Date of Birth/Sex: Treating RN: [birth year] (75 y.o. [name] [name] Primary Care Ryan Ogborn: [name], [name] Other Clinician: Referring Vitaly Wanat: Treating Shaul Trautman/Extender: [name], [name] [name], [name] Weeks in Treatment: 228 Wound Status Wound Number: 14 Primary Diabetic Wound/Ulcer of the Lower Extremity Etiology: Wound Location: Left, Lateral Lower Leg Wound Open Wounding Event: Gradually Appeared Status: Date Acquired: 08/15/2017 Comorbid Anemia, Sleep Apnea, Arrhythmia, Congestive Heart Failure, Weeks Of Treatment: 158 History: Coronary Artery Disease, Hypertension, Myocardial Infarction, Clustered Wound: Yes Type II Diabetes, Gout, Neuropathy Photos Wound Measurements Length: (cm) 10 Width: (cm) 1 Depth: (cm) 0.2 Clustered Quantity: 1 Area: (cm) 7.854 Volume: (cm) 1.571 % Reduction in Area: -733.8% % Reduction in Volume: -1571.3% Epithelialization: Medium (34-66%) Tunneling: No Undermining: No Wound Description Classification: Grade 2 Wound Margin: Distinct, outline attached Exudate Amount: Medium Exudate Type: Serosanguineous Exudate Color: red, brown Foul Odor After Cleansing: No Slough/Fibrino Yes Wound Bed Granulation Amount: Medium (34-66%) Exposed Structure Granulation Quality: Red Fascia Exposed: No Necrotic Amount: Medium (34-66%) Fat Layer (Subcutaneous Tissue) Exposed: Yes Necrotic Quality: Adherent Slough Tendon Exposed: No Muscle Exposed: No Joint Exposed: No Bone Exposed: No Treatment Notes Wound #14 (Lower Leg) Wound Laterality: Left, Lateral Cleanser Wound Cleanser Discharge Instruction: Cleanse the wound with wound cleanser prior to applying a clean dressing using gauze sponges, not  tissue or cotton balls. Soap and Water Discharge Instruction: May shower and wash wound with dial antibacterial soap and water prior to dressing change. Peri-Wound Care Triamcinolone 15 (g) Discharge Instruction: Use triamcinolone 15 (g) as directed Sween Lotion (Moisturizing lotion) Discharge Instruction: Apply moisturizing lotion as directed Topical Primary Dressing KerraCel Ag Gelling Fiber Dressing, 4x5 in (silver alginate) Discharge Instruction: Apply silver alginate to wound bed as instructed Secondary Dressing Woven Gauze Sponge, Non-Sterile 4x4 in Discharge Instruction: Apply over primary dressing as directed. ABD Pad, 5x9 Discharge Instruction: Apply over primary dressing as directed. Secured With Compression Wrap Kerlix Roll 4.5x3.1 (in/yd) Discharge Instruction: Apply Kerlix and Coban compression as directed. Coban Self-Adherent Wrap 4x5 (in/yd) Discharge  After Cleansing: No Slough/Fibrino Yes Wound Bed Granulation Amount: None Present (0%) Exposed Structure Necrotic Amount: Large (67-100%) Fascia Exposed: No Necrotic Quality: Eschar Fat Layer (Subcutaneous Tissue) Exposed: Yes Tendon Exposed: Yes Muscle Exposed: No Joint Exposed:  No Bone Exposed: No Treatment Notes Wound #26 (Foot) Wound Laterality: Dorsal, Left Cleanser Wound Cleanser Discharge Instruction: Cleanse the wound with wound cleanser prior to applying a clean dressing using gauze sponges, not tissue or cotton balls. Soap and Water Discharge Instruction: May shower and wash wound with dial antibacterial soap and water prior to dressing change. Peri-Wound Care Triamcinolone 15 (g) Discharge Instruction: Use triamcinolone 15 (g) as directed Sween Lotion (Moisturizing lotion) Discharge Instruction: Apply moisturizing lotion as directed Topical Primary Dressing KerraCel Ag Gelling Fiber Dressing, 4x5 in (silver alginate) Discharge Instruction: Apply silver alginate to wound bed as instructed Secondary Dressing Woven Gauze Sponge, Non-Sterile 4x4 in Discharge Instruction: Apply over primary dressing as directed. ABD Pad, 5x9 Discharge Instruction: Apply over primary dressing as directed. Secured With Compression Wrap Kerlix Roll 4.5x3.1 (in/yd) Discharge Instruction: Apply Kerlix and Coban compression as directed. Coban Self-Adherent Wrap 4x5 (in/yd) Discharge Instruction: Apply over Kerlix as directed. Compression Stockings Add-Ons Electronic Signature(s) Signed: 08/30/2020 6:10:24 PM By: Deon Pilling Signed: 08/31/2020 9:46:23 AM By: Sandre Kitty Entered By: Sandre Kitty on 08/30/2020 16:28:52 -------------------------------------------------------------------------------- Wound Assessment Details Patient Name: Date of Service: [name] Foley F. 08/30/2020 9:45 A M Medical Record Number: [id number] Patient Account Number: [id number] Date of Birth/Sex: Treating RN: 1946-02-10 (75 y.o. [name] [name] Primary Care Elisavet Buehrer: [name], [name] Other Clinician: Referring Eriyah Fernando: Treating Sander Speckman/Extender: [name], [name] [name], [name] Weeks in Treatment: 228 Wound Status Wound Number: 28 Primary Diabetic Wound/Ulcer of the Lower  Extremity Etiology: Wound Location: Right, Dorsal Foot Wound Open Wounding Event: Gradually Appeared Status: Date Acquired: 04/26/2020 Comorbid Anemia, Sleep Apnea, Arrhythmia, Congestive Heart Failure, Weeks Of Treatment: 18 History: Coronary Artery Disease, Hypertension, Myocardial Infarction, Clustered Wound: Yes Type II Diabetes, Gout, Neuropathy Photos Wound Measurements Length: (cm) 0.3 Width: (cm) 0.4 Depth: (cm) 0.1 Clustered Quantity: 1 Area: (cm) 0.094 Volume: (cm) 0.009 % Reduction in Area: 90.9% % Reduction in Volume: 91.3% Epithelialization: Large (67-100%) Tunneling: No Undermining: No Wound Description Classification: Grade 1 Wound Margin: Indistinct, nonvisible Exudate Amount: Medium Exudate Type: Serosanguineous Exudate Color: red, brown Foul Odor After Cleansing: No Slough/Fibrino No Wound Bed Granulation Amount: Medium (34-66%) Exposed Structure Granulation Quality: Red, Pink, Hyper-granulation Fascia Exposed: No Necrotic Amount: Medium (34-66%) Fat Layer (Subcutaneous Tissue) Exposed: Yes Necrotic Quality: Adherent Slough Tendon Exposed: No Muscle Exposed: No Joint Exposed: No Bone Exposed: No Treatment Notes Wound #28 (Foot) Wound Laterality: Dorsal, Right Cleanser Wound Cleanser Discharge Instruction: Cleanse the wound with wound cleanser prior to applying a clean dressing using gauze sponges, not tissue or cotton balls. Soap and Water Discharge Instruction: May shower and wash wound with dial antibacterial soap and water prior to dressing change. Peri-Wound Care Triamcinolone 15 (g) Discharge Instruction: Use triamcinolone 15 (g) as directed Sween Lotion (Moisturizing lotion) Discharge Instruction: Apply moisturizing lotion as directed Topical Primary Dressing KerraCel Ag Gelling Fiber Dressing, 4x5 in (silver alginate) Discharge Instruction: Apply silver alginate to wound bed as instructed Secondary Dressing Woven Gauze Sponge,  Non-Sterile 4x4 in Discharge Instruction: Apply over primary dressing as directed. ABD Pad, 5x9 Discharge Instruction: Apply over primary dressing as directed. Secured With Compression Wrap Kerlix Roll 4.5x3.1 (in/yd) Discharge Instruction: Apply Kerlix and Coban compression as directed. Coban Self-Adherent Wrap 4x5 (in/yd) Discharge Instruction: Apply over Kerlix as directed.  Compression Stockings Add-Ons Electronic Signature(s) Signed: 08/30/2020 6:10:24 PM By: Deon Pilling Signed: 08/31/2020 9:46:23 AM By: Sandre Kitty Entered By: Sandre Kitty on 08/30/2020 16:25:57 -------------------------------------------------------------------------------- Wound Assessment Details Patient Name: Date of Service: [name] Foley F. 08/30/2020 9:45 A M Medical Record Number: [id number] Patient Account Number: [id number] Date of Birth/Sex: Treating RN: December 08, 1945 (75 y.o. [name] [name] Primary Care Arthor Gorter: [name], [name] Other Clinician: Referring Shayann Garbutt: Treating Nicholis Stepanek/Extender: [name], [name] [name], [name] Weeks in Treatment: 228 Wound Status Wound Number: 32 Primary Etiology: Diabetic Wound/Ulcer of the Lower Extremity Wound Location: Left, Distal, Dorsal Foot Wound Status: Healed - Epithelialized Wounding Event: Gradually Appeared Date Acquired: 05/03/2020 Weeks Of Treatment: 17 Clustered Wound: No Wound Measurements Length: (cm) Width: (cm) Depth: (cm) Area: (cm) Volume: (cm) 0 % Reduction in Area: 100% 0 % Reduction in Volume: 100% 0 0 0 Wound Description Classification: Grade 2 Treatment Notes Wound #32 (Foot) Wound Laterality: Dorsal, Left, Distal Cleanser Peri-Wound Care Topical Primary Dressing Secondary Dressing Secured With Compression Wrap Compression Stockings Add-Ons Electronic Signature(s) Signed: 08/30/2020 6:10:24 PM By: Deon Pilling Entered By: Deon Pilling on 08/30/2020  10:33:38 -------------------------------------------------------------------------------- Wound Assessment Details Patient Name: Date of Service: [name] Foley F. 08/30/2020 9:45 A M Medical Record Number: [id number] Patient Account Number: [id number] Date of Birth/Sex: Treating RN: [birth year] (74 y.o. [name] [name] Primary Care Renee Beale: [name], [name] Other Clinician: Referring Kyrstyn Greear: Treating Keylan Costabile/Extender: [name], [name] [name], [name] Weeks in Treatment: 228 Wound Status Wound Number: 34 Primary Diabetic Wound/Ulcer of the Lower Extremity Etiology: Wound Location: Right, Medial, Dorsal Foot Wound Open Wounding Event: Gradually Appeared Status: Date Acquired: 06/14/2020 Comorbid Anemia, Sleep Apnea, Arrhythmia, Congestive Heart Failure, Weeks Of Treatment: 11 History: Coronary Artery Disease, Hypertension, Myocardial Infarction, Clustered Wound: No Type II Diabetes, Gout, Neuropathy Photos Wound Measurements Length: (cm) 1.5 Width: (cm) 0.6 Depth: (cm) 0.2 Area: (cm) 0.707 Volume: (cm) 0.141 % Reduction in Area: -157.1% % Reduction in Volume: -422.2% Epithelialization: Small (1-33%) Tunneling: No Undermining: No Wound Description Classification: Grade 2 Wound Margin: Well defined, not attached Exudate Amount: Medium Exudate Type: Serosanguineous Exudate Color: red, brown Foul Odor After Cleansing: No Slough/Fibrino Yes Wound Bed Granulation Amount: Medium (34-66%) Exposed Structure Granulation Quality: Red Fascia Exposed: No Necrotic Amount: Medium (34-66%) Fat Layer (Subcutaneous Tissue) Exposed: Yes Necrotic Quality: Adherent Slough Tendon Exposed: No Muscle Exposed: No Joint Exposed: No Bone Exposed: No Treatment Notes Wound #34 (Foot) Wound Laterality: Dorsal, Right, Medial Cleanser Wound Cleanser Discharge Instruction: Cleanse the wound with wound cleanser prior to applying a clean dressing using gauze sponges, not tissue or cotton  balls. Soap and Water Discharge Instruction: May shower and wash wound with dial antibacterial soap and water prior to dressing change. Peri-Wound Care Triamcinolone 15 (g) Discharge Instruction: Use triamcinolone 15 (g) as directed Sween Lotion (Moisturizing lotion) Discharge Instruction: Apply moisturizing lotion as directed Topical Primary Dressing KerraCel Ag Gelling Fiber Dressing, 4x5 in (silver alginate) Discharge Instruction: Apply silver alginate to wound bed as instructed Secondary Dressing Woven Gauze Sponge, Non-Sterile 4x4 in Discharge Instruction: Apply over primary dressing as directed. ABD Pad, 5x9 Discharge Instruction: Apply over primary dressing as directed. Secured With Compression Wrap Kerlix Roll 4.5x3.1 (in/yd) Discharge Instruction: Apply Kerlix and Coban compression as directed. Coban Self-Adherent Wrap 4x5 (in/yd) Discharge Instruction: Apply over Kerlix as directed. Compression Stockings Add-Ons Electronic Signature(s) Signed: 08/30/2020 6:10:24 PM By: Deon Pilling Signed: 08/31/2020 9:46:23 AM By: Sandre Kitty Entered By: Sandre Kitty on 08/30/2020 16:26:26 -------------------------------------------------------------------------------- Wound Assessment Details Patient Name: Date  After Cleansing: No Slough/Fibrino Yes Wound Bed Granulation Amount: None Present (0%) Exposed Structure Necrotic Amount: Large (67-100%) Fascia Exposed: No Necrotic Quality: Eschar Fat Layer (Subcutaneous Tissue) Exposed: Yes Tendon Exposed: Yes Muscle Exposed: No Joint Exposed:  No Bone Exposed: No Treatment Notes Wound #26 (Foot) Wound Laterality: Dorsal, Left Cleanser Wound Cleanser Discharge Instruction: Cleanse the wound with wound cleanser prior to applying a clean dressing using gauze sponges, not tissue or cotton balls. Soap and Water Discharge Instruction: May shower and wash wound with dial antibacterial soap and water prior to dressing change. Peri-Wound Care Triamcinolone 15 (g) Discharge Instruction: Use triamcinolone 15 (g) as directed Sween Lotion (Moisturizing lotion) Discharge Instruction: Apply moisturizing lotion as directed Topical Primary Dressing KerraCel Ag Gelling Fiber Dressing, 4x5 in (silver alginate) Discharge Instruction: Apply silver alginate to wound bed as instructed Secondary Dressing Woven Gauze Sponge, Non-Sterile 4x4 in Discharge Instruction: Apply over primary dressing as directed. ABD Pad, 5x9 Discharge Instruction: Apply over primary dressing as directed. Secured With Compression Wrap Kerlix Roll 4.5x3.1 (in/yd) Discharge Instruction: Apply Kerlix and Coban compression as directed. Coban Self-Adherent Wrap 4x5 (in/yd) Discharge Instruction: Apply over Kerlix as directed. Compression Stockings Add-Ons Electronic Signature(s) Signed: 08/30/2020 6:10:24 PM By: Deon Pilling Signed: 08/31/2020 9:46:23 AM By: Sandre Kitty Entered By: Sandre Kitty on 08/30/2020 16:28:52 -------------------------------------------------------------------------------- Wound Assessment Details Patient Name: Date of Service: [name] Foley F. 08/30/2020 9:45 A M Medical Record Number: [id number] Patient Account Number: [id number] Date of Birth/Sex: Treating RN: 1946-02-10 (75 y.o. [name] [name] Primary Care Elisavet Buehrer: [name], [name] Other Clinician: Referring Eriyah Fernando: Treating Sander Speckman/Extender: [name], [name] [name], [name] Weeks in Treatment: 228 Wound Status Wound Number: 28 Primary Diabetic Wound/Ulcer of the Lower  Extremity Etiology: Wound Location: Right, Dorsal Foot Wound Open Wounding Event: Gradually Appeared Status: Date Acquired: 04/26/2020 Comorbid Anemia, Sleep Apnea, Arrhythmia, Congestive Heart Failure, Weeks Of Treatment: 18 History: Coronary Artery Disease, Hypertension, Myocardial Infarction, Clustered Wound: Yes Type II Diabetes, Gout, Neuropathy Photos Wound Measurements Length: (cm) 0.3 Width: (cm) 0.4 Depth: (cm) 0.1 Clustered Quantity: 1 Area: (cm) 0.094 Volume: (cm) 0.009 % Reduction in Area: 90.9% % Reduction in Volume: 91.3% Epithelialization: Large (67-100%) Tunneling: No Undermining: No Wound Description Classification: Grade 1 Wound Margin: Indistinct, nonvisible Exudate Amount: Medium Exudate Type: Serosanguineous Exudate Color: red, brown Foul Odor After Cleansing: No Slough/Fibrino No Wound Bed Granulation Amount: Medium (34-66%) Exposed Structure Granulation Quality: Red, Pink, Hyper-granulation Fascia Exposed: No Necrotic Amount: Medium (34-66%) Fat Layer (Subcutaneous Tissue) Exposed: Yes Necrotic Quality: Adherent Slough Tendon Exposed: No Muscle Exposed: No Joint Exposed: No Bone Exposed: No Treatment Notes Wound #28 (Foot) Wound Laterality: Dorsal, Right Cleanser Wound Cleanser Discharge Instruction: Cleanse the wound with wound cleanser prior to applying a clean dressing using gauze sponges, not tissue or cotton balls. Soap and Water Discharge Instruction: May shower and wash wound with dial antibacterial soap and water prior to dressing change. Peri-Wound Care Triamcinolone 15 (g) Discharge Instruction: Use triamcinolone 15 (g) as directed Sween Lotion (Moisturizing lotion) Discharge Instruction: Apply moisturizing lotion as directed Topical Primary Dressing KerraCel Ag Gelling Fiber Dressing, 4x5 in (silver alginate) Discharge Instruction: Apply silver alginate to wound bed as instructed Secondary Dressing Woven Gauze Sponge,  Non-Sterile 4x4 in Discharge Instruction: Apply over primary dressing as directed. ABD Pad, 5x9 Discharge Instruction: Apply over primary dressing as directed. Secured With Compression Wrap Kerlix Roll 4.5x3.1 (in/yd) Discharge Instruction: Apply Kerlix and Coban compression as directed. Coban Self-Adherent Wrap 4x5 (in/yd) Discharge Instruction: Apply over Kerlix as directed.  No Joint: No Joint: No Bone: No Bone: No Large (67-100%) N/A Small (1-33%) Epithelialization: N/A N/A N/A Debridement: N/A N/A N/A Pain Control: N/A N/A N/A Tissue Debrided: N/A N/A N/A Level: N/A N/A N/A Debridement A (sq cm): rea N/A N/A N/A Instrument: N/A N/A N/A Bleeding: N/A  N/A N/A Hemostasis Achieved: N/A N/A N/A Procedural Pain: N/A N/A N/A Post Procedural Pain: N/A N/A N/A Debridement Treatment Response: N/A N/A N/A Post Debridement Measurements L x W x D (cm) N/A N/A N/A Post Debridement Volume: (cm) N/A N/A N/A Procedures Performed: Wound Number: [id number] N/A Photos: No Photos No Photos N/A Left, Lateral, Dorsal Foot Left, Lateral Foot N/A Wound Location: Gradually Appeared Gradually Appeared N/A Wounding Event: 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 N/A Primary Etiology: Extremity Anemia, Sleep Apnea, Arrhythmia, Anemia, Sleep Apnea, Arrhythmia, N/A Comorbid History: Congestive Heart Failure, Coronary Congestive Heart Failure, Coronary Artery Disease, Hypertension, Artery Disease, Hypertension, Myocardial Infarction, Type II Myocardial Infarction, Type II Diabetes, Gout, Neuropathy Diabetes, Gout, Neuropathy 05/24/2020 07/19/2020 N/A Date Acquired: 11 5 N/A Weeks of Treatment: Open Open N/A Wound Status: No No N/A Clustered Wound: N/A N/A N/A Clustered Quantity: 0.7x1.5x0.7 2.5x2.2x1.1 N/A Measurements L x W x D (cm) 0.825 4.32 N/A A (cm) : rea 0.577 4.752 N/A Volume (cm) : -75.20% -0.70% N/A % Reduction in Area: -144.50% -453.80% N/A % Reduction in Volume: Grade 2 Full Thickness Without Exposed N/A Classification: Support Structures Medium Medium N/A Exudate A mount: Purulent Serosanguineous N/A Exudate Type: yellow, brown, green red, brown N/A Exudate Color: No Yes N/A Foul Odor A Cleansing: fter N/A No N/A Odor Anticipated Due to Product Use: Distinct, outline attached Flat and Intact N/A Wound Margin: Medium (34-66%) None Present (0%) N/A Granulation A mount: Red, Hyper-granulation N/A N/A Granulation Quality: Medium (34-66%) Large (67-100%) N/A Necrotic Amount: Adherent Slough Eschar, Adherent Slough N/A Necrotic Tissue: Fat Layer (Subcutaneous Tissue): Yes Fat Layer (Subcutaneous Tissue):  Yes N/A Exposed Structures: Bone: Yes Fascia: No Fascia: No Tendon: No Tendon: No Muscle: No Muscle: No Joint: No Joint: No Bone: No None None N/A Epithelialization: N/A N/A N/A Debridement: N/A N/A N/A Pain Control: N/A N/A N/A Tissue Debrided: N/A N/A N/A Level: N/A N/A N/A Debridement A (sq cm): rea N/A N/A N/A Instrument: N/A N/A N/A Bleeding: N/A N/A N/A Hemostasis A chieved: N/A N/A N/A Procedural Pain: N/A N/A N/A Post Procedural Pain: Debridement Treatment Response: N/A N/A N/A Post Debridement Measurements L x N/A N/A N/A W x D (cm) N/A N/A N/A Post Debridement Volume: (cm) N/A N/A N/A Procedures Performed: Treatment Notes Electronic Signature(s) Signed: 08/30/2020 4:50:17 PM By: Linton Ham MD Signed: 08/30/2020 6:10:24 PM By: Deon Pilling Entered By: Linton Ham on 08/30/2020 11:00:39 -------------------------------------------------------------------------------- Multi-Disciplinary Care Plan Details Patient Name: Date of Service: [name] Foley F. 08/30/2020 9:45 A M Medical Record Number: [id number] Patient Account Number: [id number] Date of Birth/Sex: Treating RN: 05-Nov-1945 (75 y.o. [name] [name] Primary Care Jerred Zaremba: [name], [name] Other Clinician: Referring Amberlie Gaillard: Treating Chevez Sambrano/Extender: [name], [name] [name], [name] Weeks in Treatment: [place] reviewed with physician Active Inactive Wound/Skin Impairment Nursing Diagnoses: Impaired tissue integrity Goals: Patient/caregiver will verbalize understanding of skin care regimen Date Initiated: 11/19/2017 Target Resolution Date: 10/21/2020 Goal Status: Active Ulcer/skin breakdown will have a volume reduction of 30% by week 4 Date Initiated: 11/19/2017 Date Inactivated: 12/02/2017 Target Resolution Date: 12/22/2017 Goal Status: Unmet Unmet Reason: multipl comorbidities Interventions: Assess patient/caregiver ability to obtain necessary  supplies Assess ulceration(s) every

## 2020-09-01 ENCOUNTER — Ambulatory Visit (INDEPENDENT_AMBULATORY_CARE_PROVIDER_SITE_OTHER): Payer: Medicare HMO | Admitting: Infectious Disease

## 2020-09-01 ENCOUNTER — Encounter: Payer: Self-pay | Admitting: Infectious Disease

## 2020-09-01 ENCOUNTER — Other Ambulatory Visit: Payer: Self-pay

## 2020-09-01 VITALS — BP 115/71 | HR 76 | Temp 97.7°F

## 2020-09-01 DIAGNOSIS — Z95810 Presence of automatic (implantable) cardiac defibrillator: Secondary | ICD-10-CM

## 2020-09-01 DIAGNOSIS — B952 Enterococcus as the cause of diseases classified elsewhere: Secondary | ICD-10-CM

## 2020-09-01 DIAGNOSIS — A4902 Methicillin resistant Staphylococcus aureus infection, unspecified site: Secondary | ICD-10-CM | POA: Diagnosis not present

## 2020-09-01 DIAGNOSIS — M86672 Other chronic osteomyelitis, left ankle and foot: Secondary | ICD-10-CM | POA: Diagnosis not present

## 2020-09-01 DIAGNOSIS — E1142 Type 2 diabetes mellitus with diabetic polyneuropathy: Secondary | ICD-10-CM | POA: Diagnosis not present

## 2020-09-01 DIAGNOSIS — IMO0002 Reserved for concepts with insufficient information to code with codable children: Secondary | ICD-10-CM

## 2020-09-01 DIAGNOSIS — I5042 Chronic combined systolic (congestive) and diastolic (congestive) heart failure: Secondary | ICD-10-CM

## 2020-09-01 DIAGNOSIS — M21372 Foot drop, left foot: Secondary | ICD-10-CM | POA: Diagnosis not present

## 2020-09-01 DIAGNOSIS — I341 Nonrheumatic mitral (valve) prolapse: Secondary | ICD-10-CM

## 2020-09-01 DIAGNOSIS — E118 Type 2 diabetes mellitus with unspecified complications: Secondary | ICD-10-CM

## 2020-09-01 DIAGNOSIS — I130 Hypertensive heart and chronic kidney disease with heart failure and stage 1 through stage 4 chronic kidney disease, or unspecified chronic kidney disease: Secondary | ICD-10-CM | POA: Diagnosis not present

## 2020-09-01 DIAGNOSIS — A498 Other bacterial infections of unspecified site: Secondary | ICD-10-CM | POA: Diagnosis not present

## 2020-09-01 DIAGNOSIS — A499 Bacterial infection, unspecified: Secondary | ICD-10-CM

## 2020-09-01 DIAGNOSIS — E1165 Type 2 diabetes mellitus with hyperglycemia: Secondary | ICD-10-CM

## 2020-09-01 DIAGNOSIS — I255 Ischemic cardiomyopathy: Secondary | ICD-10-CM | POA: Diagnosis not present

## 2020-09-01 HISTORY — DX: Other chronic osteomyelitis, left ankle and foot: M86.672

## 2020-09-01 HISTORY — DX: Enterococcus as the cause of diseases classified elsewhere: B95.2

## 2020-09-01 HISTORY — DX: Bacterial infection, unspecified: A49.9

## 2020-09-01 HISTORY — DX: Methicillin resistant Staphylococcus aureus infection, unspecified site: A49.02

## 2020-09-01 HISTORY — DX: Other bacterial infections of unspecified site: A49.8
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
## 2020-09-06 ENCOUNTER — Ambulatory Visit (HOSPITAL_BASED_OUTPATIENT_CLINIC_OR_DEPARTMENT_OTHER): Payer: Medicare HMO | Admitting: Internal Medicine

## 2020-09-06 ENCOUNTER — Encounter (HOSPITAL_BASED_OUTPATIENT_CLINIC_OR_DEPARTMENT_OTHER): Payer: Medicare HMO | Admitting: Internal Medicine

## 2020-09-06 ENCOUNTER — Other Ambulatory Visit: Payer: Self-pay

## 2020-09-06 ENCOUNTER — Other Ambulatory Visit (HOSPITAL_COMMUNITY)
Admission: RE | Admit: 2020-09-06 | Discharge: 2020-09-06 | Disposition: A | Discharge status: Home / Self Care | Payer: Medicare HMO | Source: Ambulatory Visit | Attending: Internal Medicine | Admitting: Internal Medicine

## 2020-09-06 DIAGNOSIS — N312 Flaccid neuropathic bladder, not elsewhere classified: Secondary | ICD-10-CM | POA: Diagnosis not present

## 2020-09-06 DIAGNOSIS — E11622 Type 2 diabetes mellitus with other skin ulcer: Secondary | ICD-10-CM | POA: Diagnosis not present

## 2020-09-06 DIAGNOSIS — L97524 Non-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: Secondary | ICD-10-CM | POA: Diagnosis not present

## 2020-09-06 DIAGNOSIS — L97512 Non-pressure chronic ulcer of other part of right foot with fat layer exposed: Secondary | ICD-10-CM | POA: Diagnosis not present

## 2020-09-06 DIAGNOSIS — L97522 Non-pressure chronic ulcer of other part of left foot with fat layer exposed: Secondary | ICD-10-CM | POA: Diagnosis not present

## 2020-09-06 DIAGNOSIS — E11621 Type 2 diabetes mellitus with foot ulcer: Secondary | ICD-10-CM | POA: Diagnosis not present

## 2020-09-06 DIAGNOSIS — L97812 Non-pressure chronic ulcer of other part of right lower leg with fat layer exposed: Secondary | ICD-10-CM | POA: Diagnosis not present

## 2020-09-06 DIAGNOSIS — E1169 Type 2 diabetes mellitus with other specified complication: Secondary | ICD-10-CM | POA: Diagnosis not present

## 2020-09-06 DIAGNOSIS — L97822 Non-pressure chronic ulcer of other part of left lower leg with fat layer exposed: Secondary | ICD-10-CM | POA: Diagnosis not present

## 2020-09-06 DIAGNOSIS — E1142 Type 2 diabetes mellitus with diabetic polyneuropathy: Secondary | ICD-10-CM | POA: Diagnosis not present

## 2020-09-06 DIAGNOSIS — L97222 Non-pressure chronic ulcer of left calf with fat layer exposed: Secondary | ICD-10-CM | POA: Diagnosis not present

## 2020-09-06 LAB — CBC WITH DIFFERENTIAL/PLATELET
Abs Immature Granulocytes: 0.02 10*3/uL (ref 0.00–0.07)
Basophils Absolute: 0 10*3/uL (ref 0.0–0.1)
Basophils Relative: 1 %
Eosinophils Absolute: 0.3 10*3/uL (ref 0.0–0.5)
Eosinophils Relative: 4 %
HCT: 40.4 % (ref 39.0–52.0)
Hemoglobin: 11.7 g/dL — ABNORMAL LOW (ref 13.0–17.0)
Immature Granulocytes: 0 %
Lymphocytes Relative: 12 %
Lymphs Abs: 0.8 10*3/uL (ref 0.7–4.0)
MCH: 26.2 pg (ref 26.0–34.0)
MCHC: 29 g/dL — ABNORMAL LOW (ref 30.0–36.0)
MCV: 90.4 fL (ref 80.0–100.0)
Monocytes Absolute: 0.4 10*3/uL (ref 0.1–1.0)
Monocytes Relative: 5 %
Neutro Abs: 5.6 10*3/uL (ref 1.7–7.7)
Neutrophils Relative %: 78 %
Platelets: 228 10*3/uL (ref 150–400)
RBC: 4.47 MIL/uL (ref 4.22–5.81)
RDW: 16.4 % — ABNORMAL HIGH (ref 11.5–15.5)
WBC: 7.2 10*3/uL (ref 4.0–10.5)
nRBC: 0 % (ref 0.0–0.2)
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
## 2020-09-07 ENCOUNTER — Other Ambulatory Visit (HOSPITAL_COMMUNITY): Payer: Self-pay | Admitting: Internal Medicine

## 2020-09-07 ENCOUNTER — Encounter: Payer: Self-pay | Admitting: Family Medicine

## 2020-09-07 NOTE — Progress Notes (Signed)
Wound Cleansing / Measurement '[]'$  - 0 Simple Wound Cleansing - one wound X- 12 5 Complex Wound Cleansing - multiple wounds '[]'$  - 0 Wound Imaging (photographs - any number of wounds) '[]'$  - 0 Wound Tracing (instead of photographs) '[]'$  - 0 Simple Wound Measurement - one wound X- 12 5 Complex Wound Measurement - multiple wounds INTERVENTIONS - Wound Dressings '[]'$  - 0 Small Wound Dressing one or multiple wounds '[]'$  - 0 Medium Wound Dressing one or multiple wounds X- 12 20 Large Wound Dressing one or multiple wounds '[]'$  - 0 Application of Medications - topical '[]'$  - 0 Application of Medications - injection INTERVENTIONS - Miscellaneous '[]'$  - 0 External ear exam '[]'$  - 0 Specimen Collection (cultures, biopsies, blood, body fluids, etc.) '[]'$  - 0 Specimen(s) / Culture(s) sent or taken to Lab for analysis '[]'$  - 0 Patient Transfer (multiple staff / Civil Service fast streamer / Similar devices) '[]'$  - 0 Simple Staple / Suture removal (25 or less) '[]'$  - 0 Complex Staple / Suture removal (26 or more) '[]'$  - 0 Hypo / Hyperglycemic Management (close monitor of Blood Glucose) '[]'$  - 0 Ankle / Brachial Index (ABI) - do not check if billed separately X- 1 5 Vital Signs Has the patient been seen at the hospital within the last three years: Yes Total Score: 490 Level Of Care: New/Established - Level 5 Electronic Signature(s) Signed: 09/06/2020 5:09:03 PM By: [name] Entered By: [name] on 09/06/2020 12:14:23 -------------------------------------------------------------------------------- Lower Extremity Assessment Details Patient Name: Date of Service: [name] Foley F. 09/06/2020 10:45 A M Medical Record Number: [id number] Patient Account Number: [id number] Date of Birth/Sex: Treating RN: [birth year] (75  y.o. [name] Primary Care Bee Hammerschmidt: [name], [name] Other Clinician: Referring Mckinzey Entwistle: Treating Ekaterina Denise/Extender: [name], [name] [name], [name] Weeks in Treatment: 229 Edema Assessment Assessed: Shirlyn Goltz: No] [Right: No] Edema: [Left: Yes] [Right: Yes] Calf Left: Right: Point of Measurement: 36 cm From Medial Instep 26 cm 23 cm Ankle Left: Right: Point of Measurement: 11 cm From Medial Instep 21.5 cm 18.4 cm Vascular Assessment Pulses: Dorsalis Pedis Palpable: [Left:No] [Right:No] Electronic Signature(s) Signed: 09/06/2020 5:35:12 PM By: Baruch Gouty RN, BSN Entered By: Baruch Gouty on 09/06/2020 11:38:43 -------------------------------------------------------------------------------- Multi Wound Chart Details Patient Name: Date of Service: [name] Foley F. 09/06/2020 10:45 A M Medical Record Number: [id number] Patient Account Number: [id number] Date of Birth/Sex: Treating RN: 1946/02/21 (75 y.o. [name], [place] Primary Care Shristi Scheib: [name], [name] Other Clinician: Referring Shantaya Bluestone: Treating Janecia Palau/Extender: [name], [name] [name], [name] Weeks in Treatment: 229 Vital Signs Height(in): 74 Capillary Blood Glucose(mg/dl): 124 Weight(lbs): 150 Pulse(bpm): 68 Body Mass Index(BMI): 19 Blood Pressure(mmHg): 121/63 Temperature(F): 98.2 Respiratory Rate(breaths/min): 18 Photos: [14:No Photos Left, Lateral Lower Leg] [25:No Photos Right, Lateral Lower Leg] [26:No Photos Left, Dorsal Foot] Wound Location: [14:Gradually Appeared] [25:Gradually Appeared] [26:Gradually Appeared] Wounding Event: [14:Diabetic Wound/Ulcer of the Lower] [25:Venous Leg Ulcer] [26:Diabetic Wound/Ulcer of the Lower] Primary Etiology: [14:Extremity Anemia, Sleep Apnea, Arrhythmia,] [25:Anemia, Sleep Apnea, Arrhythmia,] [26:Extremity Anemia, Sleep Apnea, Arrhythmia,] Comorbid History: [14:Congestive Heart Failure, Coronary Artery Disease, Hypertension, Myocardial Infarction, Type II  Diabetes, Gout, Neuropathy 08/15/2017] [25:Congestive Heart Failure, Coronary Artery Disease, Hypertension, Myocardial Infarction, Type  II Diabetes, Gout, Neuropathy 07/06/2019] [26:Congestive Heart Failure, Coronary Artery Disease, Hypertension, Myocardial Infarction, Type II Diabetes, Gout, Neuropathy 08/18/2019] Date Acquired: [14:159] [25:59] [26:55] Weeks of Treatment: [14:Open] [25:Open] [26:Open] Wound Status: [14:Yes] [25:No] [26:Yes] Clustered Wound: [14:1] [25:N/A] [26:1] Clustered Quantity: [14:9.9x0.9x0.2] [25:1x0.5x0.21] [26:3x1.5x0.7] Measurements L x W x D (cm) [14:6.998] [25:0.393] [26:3.534] A (  Account Number: [id number] Date of Birth/Sex: Treating RN: 23-Jun-1945 (75 y.o. [name] Primary  Care Travarus Trudo: [name], [name] Other Clinician: Referring Shatora Weatherbee: Treating Merly Hinkson/Extender: [name], [name] [name], [name] Weeks in Treatment: 229 Wound Status Wound Number: 14 Primary Diabetic Wound/Ulcer of the Lower Extremity Etiology: Wound Location: Left, Lateral Lower Leg Wound Open Wounding Event: Gradually Appeared Status: Date Acquired: 08/15/2017 Comorbid Anemia, Sleep Apnea, Arrhythmia, Congestive Heart Failure, Weeks Of Treatment: 159 History: Coronary Artery Disease, Hypertension, Myocardial Infarction, Clustered Wound: Yes Type II Diabetes, Gout, Neuropathy Photos Wound Measurements Length: (cm) 9.9 Width: (cm) 0.9 Depth: (cm) 0.2 Clustered Quantity: 1 Area: (cm) 6.998 Volume: (cm) 1.4 % Reduction in Area: -642.9% % Reduction in Volume: -1389.4% Epithelialization: Small (1-33%) Tunneling: No Undermining: No Wound Description Classification: Grade 2 Wound Margin: Distinct, outline attached Exudate Amount: Medium Exudate Type: Purulent Exudate Color: yellow, brown, green Foul Odor After Cleansing: No Slough/Fibrino Yes Wound Bed Granulation Amount: Medium (34-66%) Exposed Structure Granulation Quality: Red Fascia Exposed: No Necrotic Amount: Medium (34-66%) Fat Layer (Subcutaneous Tissue) Exposed: Yes Necrotic Quality: Adherent Slough Tendon Exposed: No Muscle Exposed: No Joint Exposed: No Bone Exposed: No Electronic Signature(s) Signed: 09/06/2020 5:35:12 PM By: Baruch Gouty RN, BSN Signed: 09/07/2020 11:07:31 AM By: Sandre Kitty Entered By: Sandre Kitty on 09/06/2020 16:48:28 -------------------------------------------------------------------------------- Wound Assessment Details Patient Name: Date of Service: [name] Foley F. 09/06/2020 10:45 A M Medical Record Number: [id number] Patient Account Number: [id number] Date of Birth/Sex: Treating RN: September 30, 1945 (75 y.o. [name] Primary Care Daven Pinckney: [name], [name] Other  Clinician: Referring Eyana Stolze: Treating Briggett Tuccillo/Extender: [name], [name] [name], [name] Weeks in Treatment: 229 Wound Status Wound Number: 25 Primary Venous Leg Ulcer Etiology: Wound Location: Right, Lateral Lower Leg Wound Open Wounding Event: Gradually Appeared Status: Date Acquired: 07/06/2019 Comorbid Anemia, Sleep Apnea, Arrhythmia, Congestive Heart Failure, Weeks Of Treatment: 59 History: Coronary Artery Disease, Hypertension, Myocardial Infarction, Clustered Wound: No Type II Diabetes, Gout, Neuropathy Photos Wound Measurements Length: (cm) 1 Width: (cm) 0.5 Depth: (cm) 0.21 Area: (cm) 0.393 Volume: (cm) 0.082 % Reduction in Area: 85.6% % Reduction in Volume: 70% Epithelialization: Medium (34-66%) Tunneling: No Undermining: No Wound Description Classification: Full Thickness Without Exposed Support Structures Wound Margin: Flat and Intact Exudate Amount: Small Exudate Type: Serosanguineous Exudate Color: red, brown Foul Odor After Cleansing: No Slough/Fibrino No Wound Bed Granulation Amount: Large (67-100%) Exposed Structure Granulation Quality: Red Fascia Exposed: No Necrotic Amount: None Present (0%) Fat Layer (Subcutaneous Tissue) Exposed: Yes Tendon Exposed: No Muscle Exposed: No Joint Exposed: No Bone Exposed: No Electronic Signature(s) Signed: 09/06/2020 5:35:12 PM By: Baruch Gouty RN, BSN Signed: 09/07/2020 11:07:31 AM By: Sandre Kitty Entered By: Sandre Kitty on 09/06/2020 16:44:34 -------------------------------------------------------------------------------- Wound Assessment Details Patient Name: Date of Service: [name] Foley F. 09/06/2020 10:45 A M Medical Record Number: [id number] Patient Account Number: [id number] Date of Birth/Sex: Treating RN: Oct 06, 1945 (75 y.o. [name] Primary Care Scotty Weigelt: [name], [name] Other Clinician: Referring Itzae Mccurdy: Treating Aikeem Lilley/Extender: [name], [name] [name], [name] Weeks  in Treatment: 229 Wound Status Wound Number: 26 Primary Diabetic Wound/Ulcer of the Lower Extremity Etiology: Wound Location: Left, Dorsal Foot Wound Open Wounding Event: Gradually Appeared Status: Date Acquired: 08/18/2019 Comorbid Anemia, Sleep Apnea, Arrhythmia, Congestive Heart Failure, Weeks Of Treatment: 55 History: Coronary Artery Disease, Hypertension, Myocardial Infarction, Clustered Wound: Yes Type II Diabetes, Gout, Neuropathy Photos Wound Measurements Length: (cm) 3 Width: (cm) 1.5 Depth: (cm) 0.7 Clustered Quantity: 1 Area: (cm) 3.534 Volume: (cm) 2.474 % Reduction in Area: -542.5% % Reduction in  Wound Cleansing / Measurement '[]'$  - 0 Simple Wound Cleansing - one wound X- 12 5 Complex Wound Cleansing - multiple wounds '[]'$  - 0 Wound Imaging (photographs - any number of wounds) '[]'$  - 0 Wound Tracing (instead of photographs) '[]'$  - 0 Simple Wound Measurement - one wound X- 12 5 Complex Wound Measurement - multiple wounds INTERVENTIONS - Wound Dressings '[]'$  - 0 Small Wound Dressing one or multiple wounds '[]'$  - 0 Medium Wound Dressing one or multiple wounds X- 12 20 Large Wound Dressing one or multiple wounds '[]'$  - 0 Application of Medications - topical '[]'$  - 0 Application of Medications - injection INTERVENTIONS - Miscellaneous '[]'$  - 0 External ear exam '[]'$  - 0 Specimen Collection (cultures, biopsies, blood, body fluids, etc.) '[]'$  - 0 Specimen(s) / Culture(s) sent or taken to Lab for analysis '[]'$  - 0 Patient Transfer (multiple staff / Civil Service fast streamer / Similar devices) '[]'$  - 0 Simple Staple / Suture removal (25 or less) '[]'$  - 0 Complex Staple / Suture removal (26 or more) '[]'$  - 0 Hypo / Hyperglycemic Management (close monitor of Blood Glucose) '[]'$  - 0 Ankle / Brachial Index (ABI) - do not check if billed separately X- 1 5 Vital Signs Has the patient been seen at the hospital within the last three years: Yes Total Score: 490 Level Of Care: New/Established - Level 5 Electronic Signature(s) Signed: 09/06/2020 5:09:03 PM By: [name] Entered By: [name] on 09/06/2020 12:14:23 -------------------------------------------------------------------------------- Lower Extremity Assessment Details Patient Name: Date of Service: [name] Foley F. 09/06/2020 10:45 A M Medical Record Number: [id number] Patient Account Number: [id number] Date of Birth/Sex: Treating RN: [birth year] (75  y.o. [name] Primary Care Bee Hammerschmidt: [name], [name] Other Clinician: Referring Mckinzey Entwistle: Treating Ekaterina Denise/Extender: [name], [name] [name], [name] Weeks in Treatment: 229 Edema Assessment Assessed: Shirlyn Goltz: No] [Right: No] Edema: [Left: Yes] [Right: Yes] Calf Left: Right: Point of Measurement: 36 cm From Medial Instep 26 cm 23 cm Ankle Left: Right: Point of Measurement: 11 cm From Medial Instep 21.5 cm 18.4 cm Vascular Assessment Pulses: Dorsalis Pedis Palpable: [Left:No] [Right:No] Electronic Signature(s) Signed: 09/06/2020 5:35:12 PM By: Baruch Gouty RN, BSN Entered By: Baruch Gouty on 09/06/2020 11:38:43 -------------------------------------------------------------------------------- Multi Wound Chart Details Patient Name: Date of Service: [name] Foley F. 09/06/2020 10:45 A M Medical Record Number: [id number] Patient Account Number: [id number] Date of Birth/Sex: Treating RN: 1946/02/21 (75 y.o. [name], [place] Primary Care Shristi Scheib: [name], [name] Other Clinician: Referring Shantaya Bluestone: Treating Janecia Palau/Extender: [name], [name] [name], [name] Weeks in Treatment: 229 Vital Signs Height(in): 74 Capillary Blood Glucose(mg/dl): 124 Weight(lbs): 150 Pulse(bpm): 68 Body Mass Index(BMI): 19 Blood Pressure(mmHg): 121/63 Temperature(F): 98.2 Respiratory Rate(breaths/min): 18 Photos: [14:No Photos Left, Lateral Lower Leg] [25:No Photos Right, Lateral Lower Leg] [26:No Photos Left, Dorsal Foot] Wound Location: [14:Gradually Appeared] [25:Gradually Appeared] [26:Gradually Appeared] Wounding Event: [14:Diabetic Wound/Ulcer of the Lower] [25:Venous Leg Ulcer] [26:Diabetic Wound/Ulcer of the Lower] Primary Etiology: [14:Extremity Anemia, Sleep Apnea, Arrhythmia,] [25:Anemia, Sleep Apnea, Arrhythmia,] [26:Extremity Anemia, Sleep Apnea, Arrhythmia,] Comorbid History: [14:Congestive Heart Failure, Coronary Artery Disease, Hypertension, Myocardial Infarction, Type II  Diabetes, Gout, Neuropathy 08/15/2017] [25:Congestive Heart Failure, Coronary Artery Disease, Hypertension, Myocardial Infarction, Type  II Diabetes, Gout, Neuropathy 07/06/2019] [26:Congestive Heart Failure, Coronary Artery Disease, Hypertension, Myocardial Infarction, Type II Diabetes, Gout, Neuropathy 08/18/2019] Date Acquired: [14:159] [25:59] [26:55] Weeks of Treatment: [14:Open] [25:Open] [26:Open] Wound Status: [14:Yes] [25:No] [26:Yes] Clustered Wound: [14:1] [25:N/A] [26:1] Clustered Quantity: [14:9.9x0.9x0.2] [25:1x0.5x0.21] [26:3x1.5x0.7] Measurements L x W x D (cm) [14:6.998] [25:0.393] [26:3.534] A (  Volume: -4398.2% Epithelialization: None Tunneling: No Undermining: No Wound Description Classification: Grade 2 Wound Margin: Distinct, outline attached Exudate Amount: Medium Exudate Type: Purulent Exudate Color: yellow, brown, green Foul Odor After Cleansing: No Slough/Fibrino Yes Wound Bed Granulation Amount: None Present (0%) Exposed Structure Necrotic Amount: Large (67-100%) Fascia Exposed: No Necrotic Quality: Adherent Slough Fat Layer (Subcutaneous Tissue) Exposed: Yes Tendon Exposed: Yes Muscle Exposed: No Joint Exposed: No Bone Exposed: No Electronic Signature(s) Signed: 09/06/2020 5:35:12 PM By: Baruch Gouty RN, BSN Signed: 09/07/2020 11:07:31 AM By: Sandre Kitty Entered By: Sandre Kitty on 09/06/2020 16:46:34 -------------------------------------------------------------------------------- Wound Assessment Details Patient Name: Date of Service: [name] Foley F. 09/06/2020 10:45 A M Medical Record Number: [id number] Patient Account Number: [id number] Date of Birth/Sex: Treating RN: 11/07/1945 (75 y.o. [name] Primary Care Shawn Dannenberg: [name], [name] Other Clinician: Referring Govanni Plemons: Treating Jahmeek Shirk/Extender: [name], [name] [name], [name] Weeks in Treatment: 229 Wound Status Wound Number: 28 Primary Diabetic  Wound/Ulcer of the Lower Extremity Etiology: Wound Location: Right, Dorsal Foot Wound Open Wounding Event: Gradually Appeared Status: Date Acquired: 04/26/2020 Comorbid Anemia, Sleep Apnea, Arrhythmia, Congestive Heart Failure, Weeks Of Treatment: 19 History: Coronary Artery Disease, Hypertension, Myocardial Infarction, Clustered Wound: Yes Type II Diabetes, Gout, Neuropathy Photos Wound Measurements Length: (cm) 0.4 Width: (cm) 0.5 Depth: (cm) 0.2 Clustered Quantity: 1 Area: (cm) 0.157 Volume: (cm) 0.031 % Reduction in Area: 84.9% % Reduction in Volume: 70.2% Epithelialization: Small (1-33%) Tunneling: No Undermining: No Wound Description Classification: Grade 1 Wound Margin: Distinct, outline attached Exudate Amount: Small Exudate Type: Purulent Exudate Color: yellow, brown, green Foul Odor After Cleansing: No Slough/Fibrino No Wound Bed Granulation Amount: None Present (0%) Exposed Structure Necrotic Amount: Large (67-100%) Fascia Exposed: No Necrotic Quality: Adherent Slough Fat Layer (Subcutaneous Tissue) Exposed: Yes Tendon Exposed: No Muscle Exposed: No Joint Exposed: No Bone Exposed: No Electronic Signature(s) Signed: 09/06/2020 5:35:12 PM By: Baruch Gouty RN, BSN Signed: 09/07/2020 11:07:31 AM By: Sandre Kitty Entered By: Sandre Kitty on 09/06/2020 16:45:35 -------------------------------------------------------------------------------- Wound Assessment Details Patient Name: Date of Service: [name] Foley F. 09/06/2020 10:45 A M Medical Record Number: [id number] Patient Account Number: [id number] Date of Birth/Sex: Treating RN: Jan 31, 1946 (75 y.o. [name] Primary Care Trudy Kory: [name], [name] Other Clinician: Referring Roth Ress: Treating Kayton Ripp/Extender: [name], [name] [name], [name] Weeks in Treatment: 229 Wound Status Wound Number: 34 Primary Diabetic Wound/Ulcer of the Lower Extremity Etiology: Wound Location: Right,  Medial, Dorsal Foot Wound Open Wounding Event: Gradually Appeared Status: Date Acquired: 06/14/2020 Comorbid Anemia, Sleep Apnea, Arrhythmia, Congestive Heart Failure, Weeks Of Treatment: 12 History: Coronary Artery Disease, Hypertension, Myocardial Infarction, Clustered Wound: No Type II Diabetes, Gout, Neuropathy Photos Wound Measurements Length: (cm) 1.7 Width: (cm) 0.9 Depth: (cm) 0.2 Area: (cm) 1.202 Volume: (cm) 0.24 % Reduction in Area: -337.1% % Reduction in Volume: -788.9% Epithelialization: Small (1-33%) Tunneling: No Undermining: No Wound Description Classification: Grade 2 Wound Margin: Well defined, not attached Exudate Amount: Medium Exudate Type: Serosanguineous Exudate Color: red, brown Foul Odor After Cleansing: No Slough/Fibrino Yes Wound Bed Granulation Amount: Medium (34-66%) Exposed Structure Granulation Quality: Red Fascia Exposed: No Necrotic Amount: Medium (34-66%) Fat Layer (Subcutaneous Tissue) Exposed: Yes Necrotic Quality: Adherent Slough Tendon Exposed: No Muscle Exposed: No Joint Exposed: No Bone Exposed: No Electronic Signature(s) Signed: 09/06/2020 5:35:12 PM By: Baruch Gouty RN, BSN Signed: 09/07/2020 11:07:31 AM By: Sandre Kitty Entered By: Sandre Kitty on 09/06/2020 16:46:07 -------------------------------------------------------------------------------- Wound Assessment Details Patient Name: Date of Service: [name] Foley F. 09/06/2020 10:45 A M Medical  Wound Cleansing / Measurement '[]'$  - 0 Simple Wound Cleansing - one wound X- 12 5 Complex Wound Cleansing - multiple wounds '[]'$  - 0 Wound Imaging (photographs - any number of wounds) '[]'$  - 0 Wound Tracing (instead of photographs) '[]'$  - 0 Simple Wound Measurement - one wound X- 12 5 Complex Wound Measurement - multiple wounds INTERVENTIONS - Wound Dressings '[]'$  - 0 Small Wound Dressing one or multiple wounds '[]'$  - 0 Medium Wound Dressing one or multiple wounds X- 12 20 Large Wound Dressing one or multiple wounds '[]'$  - 0 Application of Medications - topical '[]'$  - 0 Application of Medications - injection INTERVENTIONS - Miscellaneous '[]'$  - 0 External ear exam '[]'$  - 0 Specimen Collection (cultures, biopsies, blood, body fluids, etc.) '[]'$  - 0 Specimen(s) / Culture(s) sent or taken to Lab for analysis '[]'$  - 0 Patient Transfer (multiple staff / Civil Service fast streamer / Similar devices) '[]'$  - 0 Simple Staple / Suture removal (25 or less) '[]'$  - 0 Complex Staple / Suture removal (26 or more) '[]'$  - 0 Hypo / Hyperglycemic Management (close monitor of Blood Glucose) '[]'$  - 0 Ankle / Brachial Index (ABI) - do not check if billed separately X- 1 5 Vital Signs Has the patient been seen at the hospital within the last three years: Yes Total Score: 490 Level Of Care: New/Established - Level 5 Electronic Signature(s) Signed: 09/06/2020 5:09:03 PM By: [name] Entered By: [name] on 09/06/2020 12:14:23 -------------------------------------------------------------------------------- Lower Extremity Assessment Details Patient Name: Date of Service: [name] Foley F. 09/06/2020 10:45 A M Medical Record Number: [id number] Patient Account Number: [id number] Date of Birth/Sex: Treating RN: [birth year] (75  y.o. [name] Primary Care Bee Hammerschmidt: [name], [name] Other Clinician: Referring Mckinzey Entwistle: Treating Ekaterina Denise/Extender: [name], [name] [name], [name] Weeks in Treatment: 229 Edema Assessment Assessed: Shirlyn Goltz: No] [Right: No] Edema: [Left: Yes] [Right: Yes] Calf Left: Right: Point of Measurement: 36 cm From Medial Instep 26 cm 23 cm Ankle Left: Right: Point of Measurement: 11 cm From Medial Instep 21.5 cm 18.4 cm Vascular Assessment Pulses: Dorsalis Pedis Palpable: [Left:No] [Right:No] Electronic Signature(s) Signed: 09/06/2020 5:35:12 PM By: Baruch Gouty RN, BSN Entered By: Baruch Gouty on 09/06/2020 11:38:43 -------------------------------------------------------------------------------- Multi Wound Chart Details Patient Name: Date of Service: [name] Foley F. 09/06/2020 10:45 A M Medical Record Number: [id number] Patient Account Number: [id number] Date of Birth/Sex: Treating RN: 1946/02/21 (75 y.o. [name], [place] Primary Care Shristi Scheib: [name], [name] Other Clinician: Referring Shantaya Bluestone: Treating Janecia Palau/Extender: [name], [name] [name], [name] Weeks in Treatment: 229 Vital Signs Height(in): 74 Capillary Blood Glucose(mg/dl): 124 Weight(lbs): 150 Pulse(bpm): 68 Body Mass Index(BMI): 19 Blood Pressure(mmHg): 121/63 Temperature(F): 98.2 Respiratory Rate(breaths/min): 18 Photos: [14:No Photos Left, Lateral Lower Leg] [25:No Photos Right, Lateral Lower Leg] [26:No Photos Left, Dorsal Foot] Wound Location: [14:Gradually Appeared] [25:Gradually Appeared] [26:Gradually Appeared] Wounding Event: [14:Diabetic Wound/Ulcer of the Lower] [25:Venous Leg Ulcer] [26:Diabetic Wound/Ulcer of the Lower] Primary Etiology: [14:Extremity Anemia, Sleep Apnea, Arrhythmia,] [25:Anemia, Sleep Apnea, Arrhythmia,] [26:Extremity Anemia, Sleep Apnea, Arrhythmia,] Comorbid History: [14:Congestive Heart Failure, Coronary Artery Disease, Hypertension, Myocardial Infarction, Type II  Diabetes, Gout, Neuropathy 08/15/2017] [25:Congestive Heart Failure, Coronary Artery Disease, Hypertension, Myocardial Infarction, Type  II Diabetes, Gout, Neuropathy 07/06/2019] [26:Congestive Heart Failure, Coronary Artery Disease, Hypertension, Myocardial Infarction, Type II Diabetes, Gout, Neuropathy 08/18/2019] Date Acquired: [14:159] [25:59] [26:55] Weeks of Treatment: [14:Open] [25:Open] [26:Open] Wound Status: [14:Yes] [25:No] [26:Yes] Clustered Wound: [14:1] [25:N/A] [26:1] Clustered Quantity: [14:9.9x0.9x0.2] [25:1x0.5x0.21] [26:3x1.5x0.7] Measurements L x W x D (cm) [14:6.998] [25:0.393] [26:3.534] A (  NEBIYU, GERHART ([id number]) Visit Report for 09/06/2020 Arrival Information Details Patient Name: Date of Service: [name] [name] [facility] F. 09/06/2020 10:45 A M Medical Record Number: [id number] Patient Account Number: [id number] Date of Birth/Sex: Treating RN: 03-03-1946 (75 y.o. [name] Primary Care Eraina Winnie: [name], [name] Other Clinician: Referring Abreanna Drawdy: Treating Jourdin Gens/Extender: [name], [name] [name], [name] Weeks in Treatment: 229 Visit Information History Since Last Visit Added or deleted any medications: No Patient Arrived: Wheel Chair Any new allergies or adverse reactions: No Arrival Time: 11:23 Had a fall or experienced change in No Accompanied By: daughter activities of daily living that may affect Transfer Assistance: Transfer Board risk of falls: Patient Identification Verified: Yes Signs or symptoms of abuse/neglect since last visito No Secondary Verification Process Completed: Yes Hospitalized since last visit: No Patient Requires Transmission-Based Precautions: No Implantable device outside of the clinic excluding No Patient Has Alerts: Yes cellular tissue based products placed in the center Patient Alerts: Patient on Blood Thinner since last visit: left ABI 1.0; rt ABI 1.09 Has Dressing in Place as Prescribed: Yes Has Compression in Place as Prescribed: Yes Pain Present Now: Yes Electronic Signature(s) Signed: 09/06/2020 5:35:12 PM By: Baruch Gouty RN, BSN Entered By: Baruch Gouty on 09/06/2020 11:23:51 -------------------------------------------------------------------------------- Clinic Level of Care Assessment Details Patient Name: Date of Service: [name] [name] [facility] F. 09/06/2020 10:45 A M Medical Record Number: [id number] Patient Account Number: [id number] Date of Birth/Sex: Treating RN: 10-Dec-1945 (75 y.o. [name], [facility] Korene Dula: [name], [name] Other Clinician: Referring Priscilla Kirstein: Treating Anaily Ashbaugh/Extender:  [name], [name] [name], [name] Weeks in Treatment: 229 Clinic Level of Care Assessment Items TOOL 4 Quantity Score X- 1 0 Use when only an EandM is performed on FOLLOW-UP visit ASSESSMENTS - Nursing Assessment / Reassessment X- 1 10 Reassessment of Co-morbidities (includes updates in patient status) X- 1 5 Reassessment of Adherence to Treatment Plan ASSESSMENTS - Wound and Skin A ssessment / Reassessment '[]'$  - 0 Simple Wound Assessment / Reassessment - one wound X- 12 5 Complex Wound Assessment / Reassessment - multiple wounds '[]'$  - 0 Dermatologic / Skin Assessment (not related to wound area) ASSESSMENTS - Focused Assessment X- 1 5 Circumferential Edema Measurements - multi extremities '[]'$  - 0 Nutritional Assessment / Counseling / Intervention '[]'$  - 0 Lower Extremity Assessment (monofilament, tuning fork, pulses) '[]'$  - 0 Peripheral Arterial Disease Assessment (using hand held doppler) ASSESSMENTS - Ostomy and/or Continence Assessment and Care '[]'$  - 0 Incontinence Assessment and Management '[]'$  - 0 Ostomy Care Assessment and Management (repouching, etc.) PROCESS - Coordination of Care '[]'$  - 0 Simple Patient / Family Education for ongoing care X- 1 20 Complex (extensive) Patient / Family Education for ongoing care X- 1 10 Staff obtains Programmer, systems, Records, T Results / Process Orders est '[]'$  - 0 Staff telephones HHA, Nursing Homes / Clarify orders / etc '[]'$  - 0 Routine Transfer to another Facility (non-emergent condition) '[]'$  - 0 Routine Hospital Admission (non-emergent condition) '[]'$  - 0 New Admissions / Biomedical engineer / Ordering NPWT Apligraf, etc. , '[]'$  - 0 Emergency Hospital Admission (emergent condition) '[]'$  - 0 Simple Discharge Coordination X- 1 15 Complex (extensive) Discharge Coordination PROCESS - Special Needs '[]'$  - 0 Pediatric / Minor Patient Management '[]'$  - 0 Isolation Patient Management '[]'$  - 0 Hearing / Language / Visual special needs '[]'$  - 0 Assessment  of Community assistance (transportation, D/C planning, etc.) '[]'$  - 0 Additional assistance / Altered mentation '[]'$  - 0 Support Surface(s) Assessment (bed, cushion, seat, etc.) INTERVENTIONS -  Account Number: [id number] Date of Birth/Sex: Treating RN: 23-Jun-1945 (75 y.o. [name] Primary  Care Travarus Trudo: [name], [name] Other Clinician: Referring Shatora Weatherbee: Treating Merly Hinkson/Extender: [name], [name] [name], [name] Weeks in Treatment: 229 Wound Status Wound Number: 14 Primary Diabetic Wound/Ulcer of the Lower Extremity Etiology: Wound Location: Left, Lateral Lower Leg Wound Open Wounding Event: Gradually Appeared Status: Date Acquired: 08/15/2017 Comorbid Anemia, Sleep Apnea, Arrhythmia, Congestive Heart Failure, Weeks Of Treatment: 159 History: Coronary Artery Disease, Hypertension, Myocardial Infarction, Clustered Wound: Yes Type II Diabetes, Gout, Neuropathy Photos Wound Measurements Length: (cm) 9.9 Width: (cm) 0.9 Depth: (cm) 0.2 Clustered Quantity: 1 Area: (cm) 6.998 Volume: (cm) 1.4 % Reduction in Area: -642.9% % Reduction in Volume: -1389.4% Epithelialization: Small (1-33%) Tunneling: No Undermining: No Wound Description Classification: Grade 2 Wound Margin: Distinct, outline attached Exudate Amount: Medium Exudate Type: Purulent Exudate Color: yellow, brown, green Foul Odor After Cleansing: No Slough/Fibrino Yes Wound Bed Granulation Amount: Medium (34-66%) Exposed Structure Granulation Quality: Red Fascia Exposed: No Necrotic Amount: Medium (34-66%) Fat Layer (Subcutaneous Tissue) Exposed: Yes Necrotic Quality: Adherent Slough Tendon Exposed: No Muscle Exposed: No Joint Exposed: No Bone Exposed: No Electronic Signature(s) Signed: 09/06/2020 5:35:12 PM By: Baruch Gouty RN, BSN Signed: 09/07/2020 11:07:31 AM By: Sandre Kitty Entered By: Sandre Kitty on 09/06/2020 16:48:28 -------------------------------------------------------------------------------- Wound Assessment Details Patient Name: Date of Service: [name] Foley F. 09/06/2020 10:45 A M Medical Record Number: [id number] Patient Account Number: [id number] Date of Birth/Sex: Treating RN: September 30, 1945 (75 y.o. [name] Primary Care Daven Pinckney: [name], [name] Other  Clinician: Referring Eyana Stolze: Treating Briggett Tuccillo/Extender: [name], [name] [name], [name] Weeks in Treatment: 229 Wound Status Wound Number: 25 Primary Venous Leg Ulcer Etiology: Wound Location: Right, Lateral Lower Leg Wound Open Wounding Event: Gradually Appeared Status: Date Acquired: 07/06/2019 Comorbid Anemia, Sleep Apnea, Arrhythmia, Congestive Heart Failure, Weeks Of Treatment: 59 History: Coronary Artery Disease, Hypertension, Myocardial Infarction, Clustered Wound: No Type II Diabetes, Gout, Neuropathy Photos Wound Measurements Length: (cm) 1 Width: (cm) 0.5 Depth: (cm) 0.21 Area: (cm) 0.393 Volume: (cm) 0.082 % Reduction in Area: 85.6% % Reduction in Volume: 70% Epithelialization: Medium (34-66%) Tunneling: No Undermining: No Wound Description Classification: Full Thickness Without Exposed Support Structures Wound Margin: Flat and Intact Exudate Amount: Small Exudate Type: Serosanguineous Exudate Color: red, brown Foul Odor After Cleansing: No Slough/Fibrino No Wound Bed Granulation Amount: Large (67-100%) Exposed Structure Granulation Quality: Red Fascia Exposed: No Necrotic Amount: None Present (0%) Fat Layer (Subcutaneous Tissue) Exposed: Yes Tendon Exposed: No Muscle Exposed: No Joint Exposed: No Bone Exposed: No Electronic Signature(s) Signed: 09/06/2020 5:35:12 PM By: Baruch Gouty RN, BSN Signed: 09/07/2020 11:07:31 AM By: Sandre Kitty Entered By: Sandre Kitty on 09/06/2020 16:44:34 -------------------------------------------------------------------------------- Wound Assessment Details Patient Name: Date of Service: [name] Foley F. 09/06/2020 10:45 A M Medical Record Number: [id number] Patient Account Number: [id number] Date of Birth/Sex: Treating RN: Oct 06, 1945 (75 y.o. [name] Primary Care Scotty Weigelt: [name], [name] Other Clinician: Referring Itzae Mccurdy: Treating Aikeem Lilley/Extender: [name], [name] [name], [name] Weeks  in Treatment: 229 Wound Status Wound Number: 26 Primary Diabetic Wound/Ulcer of the Lower Extremity Etiology: Wound Location: Left, Dorsal Foot Wound Open Wounding Event: Gradually Appeared Status: Date Acquired: 08/18/2019 Comorbid Anemia, Sleep Apnea, Arrhythmia, Congestive Heart Failure, Weeks Of Treatment: 55 History: Coronary Artery Disease, Hypertension, Myocardial Infarction, Clustered Wound: Yes Type II Diabetes, Gout, Neuropathy Photos Wound Measurements Length: (cm) 3 Width: (cm) 1.5 Depth: (cm) 0.7 Clustered Quantity: 1 Area: (cm) 3.534 Volume: (cm) 2.474 % Reduction in Area: -542.5% % Reduction in  NEBIYU, GERHART ([id number]) Visit Report for 09/06/2020 Arrival Information Details Patient Name: Date of Service: [name] [name] [facility] F. 09/06/2020 10:45 A M Medical Record Number: [id number] Patient Account Number: [id number] Date of Birth/Sex: Treating RN: 03-03-1946 (75 y.o. [name] Primary Care Eraina Winnie: [name], [name] Other Clinician: Referring Abreanna Drawdy: Treating Jourdin Gens/Extender: [name], [name] [name], [name] Weeks in Treatment: 229 Visit Information History Since Last Visit Added or deleted any medications: No Patient Arrived: Wheel Chair Any new allergies or adverse reactions: No Arrival Time: 11:23 Had a fall or experienced change in No Accompanied By: daughter activities of daily living that may affect Transfer Assistance: Transfer Board risk of falls: Patient Identification Verified: Yes Signs or symptoms of abuse/neglect since last visito No Secondary Verification Process Completed: Yes Hospitalized since last visit: No Patient Requires Transmission-Based Precautions: No Implantable device outside of the clinic excluding No Patient Has Alerts: Yes cellular tissue based products placed in the center Patient Alerts: Patient on Blood Thinner since last visit: left ABI 1.0; rt ABI 1.09 Has Dressing in Place as Prescribed: Yes Has Compression in Place as Prescribed: Yes Pain Present Now: Yes Electronic Signature(s) Signed: 09/06/2020 5:35:12 PM By: Baruch Gouty RN, BSN Entered By: Baruch Gouty on 09/06/2020 11:23:51 -------------------------------------------------------------------------------- Clinic Level of Care Assessment Details Patient Name: Date of Service: [name] [name] [facility] F. 09/06/2020 10:45 A M Medical Record Number: [id number] Patient Account Number: [id number] Date of Birth/Sex: Treating RN: 10-Dec-1945 (75 y.o. [name], [facility] Korene Dula: [name], [name] Other Clinician: Referring Priscilla Kirstein: Treating Anaily Ashbaugh/Extender:  [name], [name] [name], [name] Weeks in Treatment: 229 Clinic Level of Care Assessment Items TOOL 4 Quantity Score X- 1 0 Use when only an EandM is performed on FOLLOW-UP visit ASSESSMENTS - Nursing Assessment / Reassessment X- 1 10 Reassessment of Co-morbidities (includes updates in patient status) X- 1 5 Reassessment of Adherence to Treatment Plan ASSESSMENTS - Wound and Skin A ssessment / Reassessment '[]'$  - 0 Simple Wound Assessment / Reassessment - one wound X- 12 5 Complex Wound Assessment / Reassessment - multiple wounds '[]'$  - 0 Dermatologic / Skin Assessment (not related to wound area) ASSESSMENTS - Focused Assessment X- 1 5 Circumferential Edema Measurements - multi extremities '[]'$  - 0 Nutritional Assessment / Counseling / Intervention '[]'$  - 0 Lower Extremity Assessment (monofilament, tuning fork, pulses) '[]'$  - 0 Peripheral Arterial Disease Assessment (using hand held doppler) ASSESSMENTS - Ostomy and/or Continence Assessment and Care '[]'$  - 0 Incontinence Assessment and Management '[]'$  - 0 Ostomy Care Assessment and Management (repouching, etc.) PROCESS - Coordination of Care '[]'$  - 0 Simple Patient / Family Education for ongoing care X- 1 20 Complex (extensive) Patient / Family Education for ongoing care X- 1 10 Staff obtains Programmer, systems, Records, T Results / Process Orders est '[]'$  - 0 Staff telephones HHA, Nursing Homes / Clarify orders / etc '[]'$  - 0 Routine Transfer to another Facility (non-emergent condition) '[]'$  - 0 Routine Hospital Admission (non-emergent condition) '[]'$  - 0 New Admissions / Biomedical engineer / Ordering NPWT Apligraf, etc. , '[]'$  - 0 Emergency Hospital Admission (emergent condition) '[]'$  - 0 Simple Discharge Coordination X- 1 15 Complex (extensive) Discharge Coordination PROCESS - Special Needs '[]'$  - 0 Pediatric / Minor Patient Management '[]'$  - 0 Isolation Patient Management '[]'$  - 0 Hearing / Language / Visual special needs '[]'$  - 0 Assessment  of Community assistance (transportation, D/C planning, etc.) '[]'$  - 0 Additional assistance / Altered mentation '[]'$  - 0 Support Surface(s) Assessment (bed, cushion, seat, etc.) INTERVENTIONS -

## 2020-09-08 NOTE — Chronic Care Management (AMB) (Signed)
Error

## 2020-09-13 ENCOUNTER — Ambulatory Visit: Payer: Medicare HMO | Admitting: Vascular Surgery

## 2020-09-20 ENCOUNTER — Ambulatory Visit: Payer: Medicare HMO | Admitting: Vascular Surgery

## 2020-09-20 ENCOUNTER — Encounter (HOSPITAL_BASED_OUTPATIENT_CLINIC_OR_DEPARTMENT_OTHER): Payer: Medicare HMO | Admitting: Internal Medicine

## 2020-09-21 ENCOUNTER — Ambulatory Visit (INDEPENDENT_AMBULATORY_CARE_PROVIDER_SITE_OTHER): Payer: Medicare HMO | Admitting: Physician Assistant

## 2020-09-21 ENCOUNTER — Other Ambulatory Visit: Payer: Self-pay

## 2020-09-21 VITALS — BP 72/47 | HR 74 | Temp 98.3°F | Resp 20 | Ht 74.0 in

## 2020-09-21 DIAGNOSIS — I7025 Atherosclerosis of native arteries of other extremities with ulceration: Secondary | ICD-10-CM | POA: Diagnosis not present

## 2020-09-21 DIAGNOSIS — E11621 Type 2 diabetes mellitus with foot ulcer: Secondary | ICD-10-CM | POA: Diagnosis not present

## 2020-09-21 DIAGNOSIS — L97529 Non-pressure chronic ulcer of other part of left foot with unspecified severity: Secondary | ICD-10-CM | POA: Diagnosis not present
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
## 2020-09-27 ENCOUNTER — Ambulatory Visit (INDEPENDENT_AMBULATORY_CARE_PROVIDER_SITE_OTHER): Payer: Medicare HMO | Admitting: Vascular Surgery

## 2020-09-27 ENCOUNTER — Encounter (HOSPITAL_BASED_OUTPATIENT_CLINIC_OR_DEPARTMENT_OTHER): Payer: Medicare HMO | Attending: Internal Medicine | Admitting: Internal Medicine

## 2020-09-27 ENCOUNTER — Other Ambulatory Visit: Payer: Self-pay

## 2020-09-27 ENCOUNTER — Encounter: Payer: Self-pay | Admitting: Vascular Surgery

## 2020-09-27 DIAGNOSIS — E11621 Type 2 diabetes mellitus with foot ulcer: Secondary | ICD-10-CM | POA: Diagnosis not present

## 2020-09-27 DIAGNOSIS — E1151 Type 2 diabetes mellitus with diabetic peripheral angiopathy without gangrene: Secondary | ICD-10-CM | POA: Insufficient documentation

## 2020-09-27 DIAGNOSIS — E1142 Type 2 diabetes mellitus with diabetic polyneuropathy: Secondary | ICD-10-CM | POA: Diagnosis not present

## 2020-09-27 DIAGNOSIS — I130 Hypertensive heart and chronic kidney disease with heart failure and stage 1 through stage 4 chronic kidney disease, or unspecified chronic kidney disease: Secondary | ICD-10-CM | POA: Diagnosis not present

## 2020-09-27 DIAGNOSIS — L97812 Non-pressure chronic ulcer of other part of right lower leg with fat layer exposed: Secondary | ICD-10-CM | POA: Diagnosis not present

## 2020-09-27 DIAGNOSIS — L97524 Non-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: Secondary | ICD-10-CM | POA: Diagnosis not present

## 2020-09-27 DIAGNOSIS — E1122 Type 2 diabetes mellitus with diabetic chronic kidney disease: Secondary | ICD-10-CM | POA: Insufficient documentation

## 2020-09-27 DIAGNOSIS — L97321 Non-pressure chronic ulcer of left ankle limited to breakdown of skin: Secondary | ICD-10-CM | POA: Diagnosis not present

## 2020-09-27 DIAGNOSIS — L97511 Non-pressure chronic ulcer of other part of right foot limited to breakdown of skin: Secondary | ICD-10-CM | POA: Insufficient documentation

## 2020-09-27 DIAGNOSIS — L97522 Non-pressure chronic ulcer of other part of left foot with fat layer exposed: Secondary | ICD-10-CM | POA: Diagnosis not present

## 2020-09-27 DIAGNOSIS — L97512 Non-pressure chronic ulcer of other part of right foot with fat layer exposed: Secondary | ICD-10-CM | POA: Diagnosis not present

## 2020-09-27 DIAGNOSIS — I5042 Chronic combined systolic (congestive) and diastolic (congestive) heart failure: Secondary | ICD-10-CM | POA: Insufficient documentation

## 2020-09-27 DIAGNOSIS — I739 Peripheral vascular disease, unspecified: Secondary | ICD-10-CM | POA: Diagnosis not present

## 2020-09-27 DIAGNOSIS — N183 Chronic kidney disease, stage 3 unspecified: Secondary | ICD-10-CM | POA: Insufficient documentation

## 2020-09-27 DIAGNOSIS — L97822 Non-pressure chronic ulcer of other part of left lower leg with fat layer exposed: Secondary | ICD-10-CM | POA: Diagnosis not present

## 2020-09-27 DIAGNOSIS — L97221 Non-pressure chronic ulcer of left calf limited to breakdown of skin: Secondary | ICD-10-CM | POA: Diagnosis not present

## 2020-09-27 NOTE — Progress Notes (Signed)
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 animated and suggesting that he have a below-knee amputation. He has a follow-up appointment with Dr. Carlis Abbott. The patient has an ejection fraction of 15% he also has a pacemaker. He is a general high risk for any anesthesia but admittedly sepsis here could colonize or infect his pacemaker, cause sepsis etc. he would certainly not survive this. He arrives in clinic today with less erythema in the last foot which is nice to  see he still has the open area on the ankle with some exposed tendon left dorsal foot and the large wound from 2 to 3 weeks ago on the left lateral fifth metatarsal head with underlying osteomyelitis. 6/7 the patient has been in contact with vascular. They are recommending a left above-knee amputation as the wounds on the lateral side of his upper leg would preclude a closure of a below-knee amputation. He has breakdown on the left anterior foot, left dorsal ankle, a open area with exposed bone on the lateral left fifth metatarsal head and the original wound on the left lateral calf which is necrotic today. All of this suggests vascular causes. The patient apparently had a virtual visit with Dr. Donzetta Matters this morning and after careful consideration of his cardiac risk factors with this ischemic cardiomyopathy is elected to proceed with a left above-knee amputation. He is also discontinued the antibiotics we prescribed a few weeks ago because of diarrheao C. difficile. This is apparently a lot better but still having loose stool On the right he has an area on the right dorsal foot which is eschared. He also has an area on the right posterior lateral calf which looks stable. We have been using silver alginate kerlix Coban to all wound areas Electronic Signature(s) Signed: 09/27/2020 5:45:41 PM By: [name] Ham MD Entered By: [name] [name] on 09/27/2020 11:08:20 -------------------------------------------------------------------------------- Physical Exam Details Patient Name: Date of Service: [name] [name] F. 09/27/2020 9:45 A [name] Medical Record Number: [id number] Patient Account Number: [id number] Date of Birth/Sex: Treating RN: April 10, 1946 (75 y.o. [name] Primary Care Provider: [name], [name] Other Clinician: Referring Provider: Treating Provider/Extender: Noris Kulinski [name] [name] Weeks in Treatment: 232 Constitutional Sitting or standing Blood Pressure is within target range  for patient.. Pulse regular and within target range for patient.Marland Kitchen Respirations regular, non-labored and within target range.. Temperature is normal and within the target range for the patient.Marland Kitchen Appears in no distress. Notes Wound exam; dorsal foot/ankle again is necrotic. He has multiple superficial areas of breakdown on the anterior foot. Necrotic wound that probes to bone over the lateral fifth met head on the left and a necrotic wound on the left posterior lateral calf which has been his chronic wound. This looks ischemic as well On the right side the area on the dorsal foot is eschared I did not remove this. He has a small area on the right posterior lower calf. This does not look to be acutely threatened. Electronic  get him seen by infectious disease. The left foot is particularly bad he has a necrotic wound on the left dorsal ankle with exposed tendon hyper granulated area below this and the necrotic area on the left fifth met head which was his most recent wound and then the original area on the left lateral lower leg which gradually, paradoxically is getting better. 5/17; the patient saw Dr. Drucilla Schmidt of infectious disease. I have him on Augmentin and linezolid in response to the PCR culture noted above. Dr. Drucilla Schmidt did not feel he should receive antibiotics but was fairly animated and suggesting that he have a below-knee amputation. He has a follow-up appointment with Dr. Carlis Abbott. The patient has an ejection fraction of 15% he also has a pacemaker. He is a general high risk for any anesthesia but admittedly sepsis here could colonize or infect his pacemaker, cause sepsis etc. he would certainly not survive this. He arrives in clinic today with less erythema in the last foot which is nice to  see he still has the open area on the ankle with some exposed tendon left dorsal foot and the large wound from 2 to 3 weeks ago on the left lateral fifth metatarsal head with underlying osteomyelitis. 6/7 the patient has been in contact with vascular. They are recommending a left above-knee amputation as the wounds on the lateral side of his upper leg would preclude a closure of a below-knee amputation. He has breakdown on the left anterior foot, left dorsal ankle, a open area with exposed bone on the lateral left fifth metatarsal head and the original wound on the left lateral calf which is necrotic today. All of this suggests vascular causes. The patient apparently had a virtual visit with Dr. Donzetta Matters this morning and after careful consideration of his cardiac risk factors with this ischemic cardiomyopathy is elected to proceed with a left above-knee amputation. He is also discontinued the antibiotics we prescribed a few weeks ago because of diarrheao C. difficile. This is apparently a lot better but still having loose stool On the right he has an area on the right dorsal foot which is eschared. He also has an area on the right posterior lateral calf which looks stable. We have been using silver alginate kerlix Coban to all wound areas Electronic Signature(s) Signed: 09/27/2020 5:45:41 PM By: [name] Ham MD Entered By: [name] [name] on 09/27/2020 11:08:20 -------------------------------------------------------------------------------- Physical Exam Details Patient Name: Date of Service: [name] [name] F. 09/27/2020 9:45 A [name] Medical Record Number: [id number] Patient Account Number: [id number] Date of Birth/Sex: Treating RN: April 10, 1946 (75 y.o. [name] Primary Care Provider: [name], [name] Other Clinician: Referring Provider: Treating Provider/Extender: Noris Kulinski [name] [name] Weeks in Treatment: 232 Constitutional Sitting or standing Blood Pressure is within target range  for patient.. Pulse regular and within target range for patient.Marland Kitchen Respirations regular, non-labored and within target range.. Temperature is normal and within the target range for the patient.Marland Kitchen Appears in no distress. Notes Wound exam; dorsal foot/ankle again is necrotic. He has multiple superficial areas of breakdown on the anterior foot. Necrotic wound that probes to bone over the lateral fifth met head on the left and a necrotic wound on the left posterior lateral calf which has been his chronic wound. This looks ischemic as well On the right side the area on the dorsal foot is eschared I did not remove this. He has a small area on the right posterior lower calf. This does not look to be acutely threatened. Electronic  wound with wound cleanser prior to applying a clean dressing using gauze sponges, not tissue or cotton balls. Cleanser: Soap and Water Every Other Day/15 Days Discharge Instructions: May shower and wash wound with dial antibacterial soap and water prior to dressing change. Peri-Wound Care: Triamcinolone 15 (g) Every Other  Day/15 Days Discharge Instructions: Use triamcinolone 15 (g) as directed Peri-Wound Care: Sween Lotion (Moisturizing lotion) Every Other Day/15 Days Discharge Instructions: Apply moisturizing lotion as directed Prim Dressing: KerraCel Ag Gelling Fiber Dressing, 4x5 in (silver alginate) (Generic) Every Other Day/15 Days ary Discharge Instructions: Apply silver alginate to wound bed as instructed Secondary Dressing: Woven Gauze Sponge, Non-Sterile 4x4 in (Generic) Every Other Day/15 Days Discharge Instructions: Apply over primary dressing as directed. Secondary Dressing: ABD Pad, 5x9 (Generic) Every Other Day/15 Days Discharge Instructions: Apply over primary dressing as directed. Compression Wrap: Kerlix Roll 4.5x3.1 (in/yd) (Generic) Every Other Day/15 Days Discharge Instructions: Apply Kerlix and Coban compression as directed. Compression Wrap: Coban Self-Adherent Wrap 4x5 (in/yd) (Generic) Every Other Day/15 Days Discharge Instructions: Apply over Kerlix as directed. Wound #36 - Foot Wound Laterality: Left, Lateral Cleanser: Wound Cleanser (Generic) Every Other Day/15 Days Discharge Instructions: Cleanse the wound with wound cleanser prior to applying a clean dressing using gauze sponges, not tissue or cotton balls. Cleanser: Soap and Water Every Other Day/15 Days Discharge Instructions: May shower and wash wound with dial antibacterial soap and water prior to dressing change. Peri-Wound Care: Triamcinolone 15 (g) Every Other Day/15 Days Discharge Instructions: Use triamcinolone 15 (g) as directed Peri-Wound Care: Sween Lotion (Moisturizing lotion) Every Other Day/15 Days Discharge Instructions: Apply moisturizing lotion as directed Prim Dressing: KerraCel Ag Gelling Fiber Dressing, 4x5 in (silver alginate) (Generic) Every Other Day/15 Days ary Discharge Instructions: Apply silver alginate to wound bed as instructed Secondary Dressing: Woven Gauze Sponge, Non-Sterile 4x4 in (Generic)  Every Other Day/15 Days Discharge Instructions: Apply over primary dressing as directed. Secondary Dressing: ABD Pad, 5x9 (Generic) Every Other Day/15 Days Discharge Instructions: Apply over primary dressing as directed. Compression Wrap: Kerlix Roll 4.5x3.1 (in/yd) (Generic) Every Other Day/15 Days Discharge Instructions: Apply Kerlix and Coban compression as directed. Compression Wrap: Coban Self-Adherent Wrap 4x5 (in/yd) (Generic) Every Other Day/15 Days Discharge Instructions: Apply over Kerlix as directed. Electronic Signature(s) Signed: 09/27/2020 5:27:47 PM By: [name] Hammock RN Signed: 09/27/2020 5:45:41 PM By: [name] Ham MD Entered By: [name] [name] on 09/27/2020 10:44:39 -------------------------------------------------------------------------------- Problem List Details Patient Name: Date of Service: [name] [name] F. 09/27/2020 9:45 A [name] Medical Record Number: [id number] Patient Account Number: [id number] Date of Birth/Sex: Treating RN: 22-Sep-1945 (75 y.o. [name] [name], [name] [name] Primary Care Provider: [name], [name] Other Clinician: Referring Provider: Treating Provider/Extender: [name] [name] [name] [name] Weeks in Treatment: 232 Active Problems ICD-10 Encounter Code Description Active Date MDM Diagnosis E11.621 Type 2 diabetes mellitus with foot ulcer 04/17/2016 No Yes L97.518 Non-pressure chronic ulcer of other part of right foot with other specified 05/17/2020 No Yes severity L97.221 Non-pressure chronic ulcer of left calf limited to breakdown of skin 08/15/2017 No Yes L97.321 Non-pressure chronic ulcer of left ankle limited to breakdown of skin 07/01/2018 No Yes L97.521 Non-pressure chronic ulcer of other part of left foot limited to breakdown of 02/17/2019 No Yes skin L97.524 Non-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 06/14/2020 No Yes M86.172 Other acute osteomyelitis, left ankle and foot 08/30/2020 No Yes Inactive  Problems ICD-10 Code Description Active Date Inactive Date L89.613 Pressure ulcer of right heel, stage 3 04/17/2016

## 2020-09-27 NOTE — Progress Notes (Signed)
prior to applying a clean dressing using gauze sponges, not tissue or cotton balls. Soap and Water Discharge Instruction: May shower and wash wound with dial antibacterial soap and water prior to dressing change. Peri-Wound Care Triamcinolone 15 (g) Discharge Instruction: Use triamcinolone 15 (g) as directed Sween Lotion (Moisturizing lotion) Discharge Instruction: Apply moisturizing lotion as directed Topical Primary Dressing KerraCel Ag Gelling Fiber Dressing, 4x5 in (silver alginate) Discharge Instruction: Apply silver alginate to wound bed as  instructed Secondary Dressing Woven Gauze Sponge, Non-Sterile 4x4 in Discharge Instruction: Apply over primary dressing as directed. ABD Pad, 5x9 Discharge Instruction: Apply over primary dressing as directed. Secured With Compression Wrap Kerlix Roll 4.5x3.1 (in/yd) Discharge Instruction: Apply Kerlix and Coban compression as directed. Coban Self-Adherent Wrap 4x5 (in/yd) Discharge Instruction: Apply over Kerlix as directed. Compression Stockings Add-Ons Electronic Signature(s) Signed: 09/27/2020 5:03:12 PM By: Sandre Kitty Signed: 09/27/2020 5:39:03 PM By: Deon Pilling Entered By: Sandre Kitty on 09/27/2020 15:28:32 -------------------------------------------------------------------------------- Wound Assessment Details Patient Name: Date of Service: [name] 09/27/2020 9:45 A M Medical Record Number: [id number] Patient Account Number: [id number] Date of Birth/Sex: Treating RN: [birth year] (75 y.o. [name] Primary Care Germaine Ripp: [name], [name] Other Clinician: Referring Patriciann Becht: Treating Laurabelle Gorczyca/Extender: [name], [name] [name], [name] Weeks in Treatment: 232 Wound Status Wound Number: 28 Primary Diabetic Wound/Ulcer of the Lower Extremity Etiology: Wound Location: Right, Dorsal Foot Wound Open Wounding Event: Gradually Appeared Status: Date Acquired: 04/26/2020 Comorbid Anemia, Sleep Apnea, Arrhythmia, Congestive Heart Failure, Weeks Of Treatment: 22 History: Coronary Artery Disease, Hypertension, Myocardial Infarction, Clustered Wound: Yes Type II Diabetes, Gout, Neuropathy Photos Wound Measurements Length: (cm) 0.1 Width: (cm) 0.1 Depth: (cm) 0.1 Clustered Quantity: 1 Area: (cm) 0.008 Volume: (cm) 0.001 % Reduction in Area: 99.2% % Reduction in Volume: 99% Epithelialization: Large (67-100%) Tunneling: No Undermining: No Wound Description Classification: Grade 1 Wound Margin: Distinct, outline attached Exudate Amount: None  Present Foul Odor After Cleansing: No Slough/Fibrino No Wound Bed Granulation Amount: None Present (0%) Exposed Structure Necrotic Amount: None Present (0%) Fascia Exposed: No Fat Layer (Subcutaneous Tissue) Exposed: Yes Tendon Exposed: No Muscle Exposed: No Joint Exposed: No Bone Exposed: No Treatment Notes Wound #28 (Foot) Wound Laterality: Dorsal, Right Cleanser Wound Cleanser Discharge Instruction: Cleanse the wound with wound cleanser prior to applying a clean dressing using gauze sponges, not tissue or cotton balls. Soap and Water Discharge Instruction: May shower and wash wound with dial antibacterial soap and water prior to dressing change. Peri-Wound Care Triamcinolone 15 (g) Discharge Instruction: Use triamcinolone 15 (g) as directed Sween Lotion (Moisturizing lotion) Discharge Instruction: Apply moisturizing lotion as directed Topical Primary Dressing KerraCel Ag Gelling Fiber Dressing, 4x5 in (silver alginate) Discharge Instruction: Apply silver alginate to wound bed as instructed Secondary Dressing Woven Gauze Sponge, Non-Sterile 4x4 in Discharge Instruction: Apply over primary dressing as directed. ABD Pad, 5x9 Discharge Instruction: Apply over primary dressing as directed. Secured With Compression Wrap Kerlix Roll 4.5x3.1 (in/yd) Discharge Instruction: Apply Kerlix and Coban compression as directed. Coban Self-Adherent Wrap 4x5 (in/yd) Discharge Instruction: Apply over Kerlix as directed. Compression Stockings Add-Ons Electronic Signature(s) Signed: 09/27/2020 5:03:12 PM By: Sandre Kitty Signed: 09/27/2020 5:39:03 PM By: Deon Pilling Entered By: Sandre Kitty on 09/27/2020 15:24:33 -------------------------------------------------------------------------------- Wound Assessment Details Patient Name: Date of Service: [name] 09/27/2020 9:45 A M Medical Record Number: [id number] Patient Account Number: [id number] Date of  Birth/Sex: Treating RN: Oct 01, 1945 (75 y.o. [name] Primary Care Kamirah Shugrue: [name], [name] Other Clinician: Referring Terril Chestnut: Treating Verginia Toohey/Extender:  No Leg drop or elevation: No Is the Current Pain Management Adequate: Adequate How does your wound impact your activities of daily  livingo Sleep: No Bathing: No Appetite: No Relationship With Others: No Bladder Continence: No Emotions: No Bowel Continence: No Work: No Toileting: No Drive: No Dressing: No Hobbies: No Electronic Signature(s) Signed: 09/27/2020 5:39:03 PM By: Deon Pilling Entered By: Deon Pilling on 09/27/2020 10:19:44 -------------------------------------------------------------------------------- Patient/Caregiver Education Details Patient Name: Date of Service: [name] 6/7/2022andnbsp9:45 A M Medical Record Number: [id number] Patient Account Number: [id number] Date of Birth/Gender: Treating RN: 05/19/1945 (75 y.o. [name] Primary Care Physician: [name], [name] Other Clinician: Referring Physician: Treating Physician/Extender: [name], [name] [name], [name] Weeks in Treatment: 98 Education Assessment Education Provided To: Patient Education Topics Provided Wound/Skin Impairment: Methods: Explain/Verbal Responses: State content correctly Electronic Signature(s) Signed: 09/27/2020 5:27:47 PM By: [name] Signed: 09/27/2020 5:27:47 PM By: [name] Entered By: [name] on 09/27/2020 10:48:39 -------------------------------------------------------------------------------- Wound Assessment Details Patient Name: Date of Service: [name] 09/27/2020 9:45 A M Medical Record Number: [id number] Patient Account Number: [id number] Date of Birth/Sex: Treating RN: 02/27/1946 (75 y.o. [name] Primary Care Senay Sistrunk: [name], [name] Other Clinician: Referring Tevis Dunavan: Treating Sherlyn Ebbert/Extender: [name], [name] [name], [name] Weeks in Treatment: 232 Wound Status Wound Number: 14 Primary Diabetic Wound/Ulcer of the Lower Extremity Etiology: Wound Location: Left, Lateral Lower Leg Wound Open Wounding Event: Gradually Appeared Status: Date Acquired: 08/15/2017 Comorbid Anemia, Sleep Apnea, Arrhythmia, Congestive Heart  Failure, Weeks Of Treatment: 162 History: Coronary Artery Disease, Hypertension, Myocardial Infarction, Clustered Wound: Yes Type II Diabetes, Gout, Neuropathy Photos Wound Measurements Length: (cm) 10.3 Width: (cm) 1.7 Depth: (cm) 0.3 Area: (cm) 13.752 Volume: (cm) 4.126 % Reduction in Area: -1359.9% % Reduction in Volume: -4289.4% Epithelialization: Small (1-33%) Tunneling: No Undermining: No Wound Description Classification: Grade 2 Wound Margin: Distinct, outline attached Exudate Amount: Large Exudate Type: Purulent Exudate Color: yellow, brown, green Foul Odor After Cleansing: No Slough/Fibrino Yes Wound Bed Granulation Amount: Medium (34-66%) Exposed Structure Granulation Quality: Red Fascia Exposed: No Necrotic Amount: Medium (34-66%) Fat Layer (Subcutaneous Tissue) Exposed: Yes Necrotic Quality: Eschar, Adherent Slough Tendon Exposed: No Muscle Exposed: No Joint Exposed: No Bone Exposed: No Treatment Notes Wound #14 (Lower Leg) Wound Laterality: Left, Lateral Cleanser Wound Cleanser Discharge Instruction: Cleanse the wound with wound cleanser prior to applying a clean dressing using gauze sponges, not tissue or cotton balls. Soap and Water Discharge Instruction: May shower and wash wound with dial antibacterial soap and water prior to dressing change. Peri-Wound Care Triamcinolone 15 (g) Discharge Instruction: Use triamcinolone 15 (g) as directed Sween Lotion (Moisturizing lotion) Discharge Instruction: Apply moisturizing lotion as directed Topical Primary Dressing KerraCel Ag Gelling Fiber Dressing, 4x5 in (silver alginate) Discharge Instruction: Apply silver alginate to wound bed as instructed Secondary Dressing Woven Gauze Sponge, Non-Sterile 4x4 in Discharge Instruction: Apply over primary dressing as directed. ABD Pad, 5x9 Discharge Instruction: Apply over primary dressing as directed. Secured With Compression Wrap Kerlix Roll 4.5x3.1  (in/yd) Discharge Instruction: Apply Kerlix and Coban compression as directed. Coban Self-Adherent Wrap 4x5 (in/yd) Discharge Instruction: Apply over Kerlix as directed. Compression Stockings Add-Ons Electronic Signature(s) Signed: 09/27/2020 5:03:12 PM By: Sandre Kitty Signed: 09/27/2020 5:39:03 PM By: Deon Pilling Entered By: Sandre Kitty on 09/27/2020 15:26:26 -------------------------------------------------------------------------------- Wound Assessment Details Patient Name: Date of Service: [name] 09/27/2020 9:45 A M Medical Record Number: [id number] Patient Account Number: [id number] Date of Birth/Sex: Treating RN: 1945-11-12 (75 y.o.  [place]ES, [place] ([id number]) Visit Report for 09/27/2020 Arrival Information Details Patient Name: Date of Service: [name] [facility] F. 09/27/2020 9:45 A M Medical Record Number: [id number] Patient Account Number: [id number] Date of Birth/Sex: Treating RN: [birth year] (75 y.o. [name], Tammi Klippel Primary Care Sharryn Belding: [name], [name] Other Clinician: Referring Rashaunda Rahl: Treating Xela Oregel/Extender: [name], [name] [name], [name] Weeks in Treatment: 81 Visit Information History Since Last Visit All ordered tests and consults were completed: Yes Patient Arrived: Wheel Chair Added or deleted any medications: No Arrival Time: 10:10 Any new allergies or adverse reactions: No Accompanied By: self Had a fall or experienced change in No Transfer Assistance: None activities of daily living that may affect Patient Identification Verified: Yes risk of falls: Secondary Verification Process Completed: Yes Signs or symptoms of abuse/neglect since last visito No Patient Requires Transmission-Based Precautions: No Hospitalized since last visit: No Patient Has Alerts: Yes Implantable device outside of the clinic excluding No Patient Alerts: Patient on Blood Thinner cellular tissue based products placed in the center left ABI 1.0; rt ABI 1.09 since last visit: Has Dressing in Place as Prescribed: Yes Has Compression in Place as Prescribed: Yes Pain Present Now: No Notes Per patient spoke with [facility] this morning over phone and seen physician last week. Electronic Signature(s) Signed: 09/27/2020 5:39:03 PM By: Deon Pilling Entered By: Deon Pilling on 09/27/2020 10:19:14 -------------------------------------------------------------------------------- Clinic Level of Care Assessment Details Patient Name: Date of Service: [name] 09/27/2020 9:45 A M Medical Record Number: [id number] Patient Account Number: [id number] Date of Birth/Sex: Treating RN: 1945/06/17 (75 y.o. [name], [place] Primary Care Deshone Lyssy: [name], [name] Other Clinician: Referring Clayvon Parlett: Treating Tabius Rood/Extender: [name], [name] [name], [name] Weeks in Treatment: 232 Clinic Level of Care Assessment Items TOOL 4 Quantity Score X- 1 0 Use when only an EandM is performed on FOLLOW-UP visit ASSESSMENTS - Nursing Assessment / Reassessment X- 1 10 Reassessment of Co-morbidities (includes updates in patient status) X- 1 5 Reassessment of Adherence to Treatment Plan ASSESSMENTS - Wound and Skin A ssessment / Reassessment '[]'$  - 0 Simple Wound Assessment / Reassessment - one wound X- 10 5 Complex Wound Assessment / Reassessment - multiple wounds '[]'$  - 0 Dermatologic / Skin Assessment (not related to wound area) ASSESSMENTS - Focused Assessment X- 3 5 Circumferential Edema Measurements - multi extremities '[]'$  - 0 Nutritional Assessment / Counseling / Intervention '[]'$  - 0 Lower Extremity Assessment (monofilament, tuning fork, pulses) '[]'$  - 0 Peripheral Arterial Disease Assessment (using hand held doppler) ASSESSMENTS - Ostomy and/or Continence Assessment and Care '[]'$  - 0 Incontinence Assessment and Management '[]'$  - 0 Ostomy Care Assessment and Management (repouching, etc.) PROCESS - Coordination of Care '[]'$  - 0 Simple Patient / Family Education for ongoing care X- 1 20 Complex (extensive) Patient / Family Education for ongoing care X- 1 10 Staff obtains Programmer, systems, Records, T Results / Process Orders est '[]'$  - 0 Staff telephones HHA, Nursing Homes / Clarify orders / etc '[]'$  - 0 Routine Transfer to another Facility (non-emergent condition) '[]'$  - 0 Routine Hospital Admission (non-emergent condition) '[]'$  - 0 New Admissions / Biomedical engineer / Ordering NPWT Apligraf, etc. , '[]'$  - 0 Emergency Hospital Admission (emergent condition) '[]'$  - 0 Simple Discharge Coordination X- 1 15 Complex (extensive) Discharge Coordination PROCESS - Special Needs '[]'$  - 0 Pediatric / Minor  Patient Management '[]'$  - 0 Isolation Patient Management '[]'$  - 0 Hearing / Language / Visual special needs '[]'$  - 0 Assessment of Community assistance (transportation, D/C  [place]ES, [place] ([id number]) Visit Report for 09/27/2020 Arrival Information Details Patient Name: Date of Service: [name] [facility] F. 09/27/2020 9:45 A M Medical Record Number: [id number] Patient Account Number: [id number] Date of Birth/Sex: Treating RN: [birth year] (75 y.o. [name], Tammi Klippel Primary Care Sharryn Belding: [name], [name] Other Clinician: Referring Rashaunda Rahl: Treating Xela Oregel/Extender: [name], [name] [name], [name] Weeks in Treatment: 81 Visit Information History Since Last Visit All ordered tests and consults were completed: Yes Patient Arrived: Wheel Chair Added or deleted any medications: No Arrival Time: 10:10 Any new allergies or adverse reactions: No Accompanied By: self Had a fall or experienced change in No Transfer Assistance: None activities of daily living that may affect Patient Identification Verified: Yes risk of falls: Secondary Verification Process Completed: Yes Signs or symptoms of abuse/neglect since last visito No Patient Requires Transmission-Based Precautions: No Hospitalized since last visit: No Patient Has Alerts: Yes Implantable device outside of the clinic excluding No Patient Alerts: Patient on Blood Thinner cellular tissue based products placed in the center left ABI 1.0; rt ABI 1.09 since last visit: Has Dressing in Place as Prescribed: Yes Has Compression in Place as Prescribed: Yes Pain Present Now: No Notes Per patient spoke with [facility] this morning over phone and seen physician last week. Electronic Signature(s) Signed: 09/27/2020 5:39:03 PM By: Deon Pilling Entered By: Deon Pilling on 09/27/2020 10:19:14 -------------------------------------------------------------------------------- Clinic Level of Care Assessment Details Patient Name: Date of Service: [name] 09/27/2020 9:45 A M Medical Record Number: [id number] Patient Account Number: [id number] Date of Birth/Sex: Treating RN: 1945/06/17 (75 y.o. [name], [place] Primary Care Deshone Lyssy: [name], [name] Other Clinician: Referring Clayvon Parlett: Treating Tabius Rood/Extender: [name], [name] [name], [name] Weeks in Treatment: 232 Clinic Level of Care Assessment Items TOOL 4 Quantity Score X- 1 0 Use when only an EandM is performed on FOLLOW-UP visit ASSESSMENTS - Nursing Assessment / Reassessment X- 1 10 Reassessment of Co-morbidities (includes updates in patient status) X- 1 5 Reassessment of Adherence to Treatment Plan ASSESSMENTS - Wound and Skin A ssessment / Reassessment '[]'$  - 0 Simple Wound Assessment / Reassessment - one wound X- 10 5 Complex Wound Assessment / Reassessment - multiple wounds '[]'$  - 0 Dermatologic / Skin Assessment (not related to wound area) ASSESSMENTS - Focused Assessment X- 3 5 Circumferential Edema Measurements - multi extremities '[]'$  - 0 Nutritional Assessment / Counseling / Intervention '[]'$  - 0 Lower Extremity Assessment (monofilament, tuning fork, pulses) '[]'$  - 0 Peripheral Arterial Disease Assessment (using hand held doppler) ASSESSMENTS - Ostomy and/or Continence Assessment and Care '[]'$  - 0 Incontinence Assessment and Management '[]'$  - 0 Ostomy Care Assessment and Management (repouching, etc.) PROCESS - Coordination of Care '[]'$  - 0 Simple Patient / Family Education for ongoing care X- 1 20 Complex (extensive) Patient / Family Education for ongoing care X- 1 10 Staff obtains Programmer, systems, Records, T Results / Process Orders est '[]'$  - 0 Staff telephones HHA, Nursing Homes / Clarify orders / etc '[]'$  - 0 Routine Transfer to another Facility (non-emergent condition) '[]'$  - 0 Routine Hospital Admission (non-emergent condition) '[]'$  - 0 New Admissions / Biomedical engineer / Ordering NPWT Apligraf, etc. , '[]'$  - 0 Emergency Hospital Admission (emergent condition) '[]'$  - 0 Simple Discharge Coordination X- 1 15 Complex (extensive) Discharge Coordination PROCESS - Special Needs '[]'$  - 0 Pediatric / Minor  Patient Management '[]'$  - 0 Isolation Patient Management '[]'$  - 0 Hearing / Language / Visual special needs '[]'$  - 0 Assessment of Community assistance (transportation, D/C  [place]ES, [place] ([id number]) Visit Report for 09/27/2020 Arrival Information Details Patient Name: Date of Service: [name] [facility] F. 09/27/2020 9:45 A M Medical Record Number: [id number] Patient Account Number: [id number] Date of Birth/Sex: Treating RN: [birth year] (75 y.o. [name], Tammi Klippel Primary Care Sharryn Belding: [name], [name] Other Clinician: Referring Rashaunda Rahl: Treating Xela Oregel/Extender: [name], [name] [name], [name] Weeks in Treatment: 81 Visit Information History Since Last Visit All ordered tests and consults were completed: Yes Patient Arrived: Wheel Chair Added or deleted any medications: No Arrival Time: 10:10 Any new allergies or adverse reactions: No Accompanied By: self Had a fall or experienced change in No Transfer Assistance: None activities of daily living that may affect Patient Identification Verified: Yes risk of falls: Secondary Verification Process Completed: Yes Signs or symptoms of abuse/neglect since last visito No Patient Requires Transmission-Based Precautions: No Hospitalized since last visit: No Patient Has Alerts: Yes Implantable device outside of the clinic excluding No Patient Alerts: Patient on Blood Thinner cellular tissue based products placed in the center left ABI 1.0; rt ABI 1.09 since last visit: Has Dressing in Place as Prescribed: Yes Has Compression in Place as Prescribed: Yes Pain Present Now: No Notes Per patient spoke with [facility] this morning over phone and seen physician last week. Electronic Signature(s) Signed: 09/27/2020 5:39:03 PM By: Deon Pilling Entered By: Deon Pilling on 09/27/2020 10:19:14 -------------------------------------------------------------------------------- Clinic Level of Care Assessment Details Patient Name: Date of Service: [name] 09/27/2020 9:45 A M Medical Record Number: [id number] Patient Account Number: [id number] Date of Birth/Sex: Treating RN: 1945/06/17 (75 y.o. [name], [place] Primary Care Deshone Lyssy: [name], [name] Other Clinician: Referring Clayvon Parlett: Treating Tabius Rood/Extender: [name], [name] [name], [name] Weeks in Treatment: 232 Clinic Level of Care Assessment Items TOOL 4 Quantity Score X- 1 0 Use when only an EandM is performed on FOLLOW-UP visit ASSESSMENTS - Nursing Assessment / Reassessment X- 1 10 Reassessment of Co-morbidities (includes updates in patient status) X- 1 5 Reassessment of Adherence to Treatment Plan ASSESSMENTS - Wound and Skin A ssessment / Reassessment '[]'$  - 0 Simple Wound Assessment / Reassessment - one wound X- 10 5 Complex Wound Assessment / Reassessment - multiple wounds '[]'$  - 0 Dermatologic / Skin Assessment (not related to wound area) ASSESSMENTS - Focused Assessment X- 3 5 Circumferential Edema Measurements - multi extremities '[]'$  - 0 Nutritional Assessment / Counseling / Intervention '[]'$  - 0 Lower Extremity Assessment (monofilament, tuning fork, pulses) '[]'$  - 0 Peripheral Arterial Disease Assessment (using hand held doppler) ASSESSMENTS - Ostomy and/or Continence Assessment and Care '[]'$  - 0 Incontinence Assessment and Management '[]'$  - 0 Ostomy Care Assessment and Management (repouching, etc.) PROCESS - Coordination of Care '[]'$  - 0 Simple Patient / Family Education for ongoing care X- 1 20 Complex (extensive) Patient / Family Education for ongoing care X- 1 10 Staff obtains Programmer, systems, Records, T Results / Process Orders est '[]'$  - 0 Staff telephones HHA, Nursing Homes / Clarify orders / etc '[]'$  - 0 Routine Transfer to another Facility (non-emergent condition) '[]'$  - 0 Routine Hospital Admission (non-emergent condition) '[]'$  - 0 New Admissions / Biomedical engineer / Ordering NPWT Apligraf, etc. , '[]'$  - 0 Emergency Hospital Admission (emergent condition) '[]'$  - 0 Simple Discharge Coordination X- 1 15 Complex (extensive) Discharge Coordination PROCESS - Special Needs '[]'$  - 0 Pediatric / Minor  Patient Management '[]'$  - 0 Isolation Patient Management '[]'$  - 0 Hearing / Language / Visual special needs '[]'$  - 0 Assessment of Community assistance (transportation, D/C  with dial antibacterial soap and water prior to dressing change. Peri-Wound Care Triamcinolone 15 (g) Discharge Instruction: Use triamcinolone 15 (g) as directed Sween Lotion (Moisturizing lotion) Discharge Instruction: Apply moisturizing lotion as directed Topical Primary Dressing KerraCel Ag Gelling Fiber Dressing, 4x5 in (silver alginate) Discharge Instruction: Apply silver alginate to wound bed as instructed Secondary Dressing Woven Gauze Sponge, Non-Sterile 4x4 in Discharge Instruction: Apply over primary dressing as directed. ABD Pad, 5x9 Discharge Instruction: Apply over primary dressing as directed. Secured With Compression Wrap Kerlix Roll 4.5x3.1 (in/yd) Discharge Instruction: Apply Kerlix and Coban compression as directed. Coban Self-Adherent Wrap 4x5 (in/yd) Discharge Instruction: Apply over Kerlix as directed. Compression Stockings Add-Ons Electronic Signature(s) Signed: 09/27/2020 5:03:12 PM By: Sandre Kitty Signed: 09/27/2020 5:39:03 PM By: Deon Pilling Entered By: Sandre Kitty on 09/27/2020  15:27:55 -------------------------------------------------------------------------------- Wound Assessment Details Patient Name: Date of Service: [name] 09/27/2020 9:45 A M Medical Record Number: [id number] Patient Account Number: [id number] Date of Birth/Sex: Treating RN: 1946/04/17 (75 y.o. [name] Primary Care Micheal Murad: [name], [name] Other Clinician: Referring Betsabe Iglesia: Treating Alexsa Flaum/Extender: [name], [name] [name], [name] Weeks in Treatment: 232 Wound Status Wound Number: 36 Primary Venous Leg Ulcer Etiology: Wound Location: Left, Lateral Foot Wound Open Wounding Event: Gradually Appeared Status: Date Acquired: 07/19/2020 Comorbid Anemia, Sleep Apnea, Arrhythmia, Congestive Heart Failure, Weeks Of Treatment: 9 History: Coronary Artery Disease, Hypertension, Myocardial Infarction, Clustered Wound: No Type II Diabetes, Gout, Neuropathy Photos Wound Measurements Length: (cm) 2.5 Width: (cm) 2.1 Depth: (cm) 1.3 Area: (cm) 4.123 Volume: (cm) 5.36 % Reduction in Area: 3.8% % Reduction in Volume: -524.7% Epithelialization: None Tunneling: No Undermining: Yes Starting Position (o'clock): 4 Ending Position (o'clock): 6 Maximum Distance: (cm) 0.5 Wound Description Classification: Full Thickness With Exposed Support Structures Wound Margin: Well defined, not attached Exudate Amount: Large Exudate Type: Purulent Exudate Color: yellow, brown, green Foul Odor After Cleansing: Yes Due to Product Use: No Slough/Fibrino Yes Wound Bed Granulation Amount: None Present (0%) Exposed Structure Necrotic Amount: Large (67-100%) Fascia Exposed: No Necrotic Quality: Adherent Slough Fat Layer (Subcutaneous Tissue) Exposed: Yes Tendon Exposed: No Muscle Exposed: No Joint Exposed: No Bone Exposed: Yes Treatment Notes Wound #36 (Foot) Wound Laterality: Left, Lateral Cleanser Wound Cleanser Discharge Instruction: Cleanse the wound with wound cleanser  prior to applying a clean dressing using gauze sponges, not tissue or cotton balls. Soap and Water Discharge Instruction: May shower and wash wound with dial antibacterial soap and water prior to dressing change. Peri-Wound Care Triamcinolone 15 (g) Discharge Instruction: Use triamcinolone 15 (g) as directed Sween Lotion (Moisturizing lotion) Discharge Instruction: Apply moisturizing lotion as directed Topical Primary Dressing KerraCel Ag Gelling Fiber Dressing, 4x5 in (silver alginate) Discharge Instruction: Apply silver alginate to wound bed as instructed Secondary Dressing Woven Gauze Sponge, Non-Sterile 4x4 in Discharge Instruction: Apply over primary dressing as directed. ABD Pad, 5x9 Discharge Instruction: Apply over primary dressing as directed. Secured With Compression Wrap Kerlix Roll 4.5x3.1 (in/yd) Discharge Instruction: Apply Kerlix and Coban compression as directed. Coban Self-Adherent Wrap 4x5 (in/yd) Discharge Instruction: Apply over Kerlix as directed. Compression Stockings Add-Ons Electronic Signature(s) Signed: 09/27/2020 5:03:12 PM By: Sandre Kitty Signed: 09/27/2020 5:39:03 PM By: Deon Pilling Entered By: Sandre Kitty on 09/27/2020 15:27:06 -------------------------------------------------------------------------------- Wound Assessment Details Patient Name: Date of Service: [name] 09/27/2020 9:45 A M Medical Record Number: [id number] Patient Account Number: [id number] Date of Birth/Sex: Treating RN: Apr 28, 1945 (75 y.o. [name] Primary Care Mikyla Schachter: [name], [name] Other Clinician: Referring Laker Thompson: Treating Cort Dragoo/Extender:  Muscle: No Tendon: No Joint: No Joint: No Muscle: No Bone: No Bone: No Joint: No Large (67-100%) Large (67-100%) None Epithelialization: Wound Number: [id number] N/A Photos: No Photos No Photos N/A Left, Lateral Foot Left, Anterior Lower Leg N/A Wound Location: Gradually Appeared Gradually Appeared N/A Wounding Event: Venous Leg Ulcer Diabetic Wound/Ulcer of the Lower N/A Primary Etiology: Extremity N/A Arterial Insufficiency Ulcer N/A Secondary Etiology: Anemia, Sleep Apnea, Arrhythmia, Anemia, Sleep Apnea, Arrhythmia, N/A Comorbid History: Congestive Heart Failure, Coronary Congestive Heart Failure, Coronary Artery Disease, Hypertension, Artery Disease,  Hypertension, Myocardial Infarction, Type II Myocardial Infarction, Type II Diabetes, Gout, Neuropathy Diabetes, Gout, Neuropathy 07/19/2020 09/26/2020 N/A Date Acquired: 9 0 N/A Weeks of Treatment: Open Open N/A Wound Status: No Yes N/A Clustered Wound: N/A 3 N/A Clustered Quantity: 2.5x2.1x1.3 9.8x3.4x0.1 N/A Measurements L x W x D (cm) 4.123 26.169 N/A A (cm) : rea 5.36 2.617 N/A Volume (cm) : 3.80% N/A N/A % Reduction in A rea: -524.70% N/A N/A % Reduction in Volume: 4 Starting Position 1 (o'clock): 6 Ending Position 1 (o'clock): 0.5 Maximum Distance 1 (cm): Yes No N/A Undermining: Full Thickness With Exposed Support Grade 2 N/A Classification: Structures Large Medium N/A Exudate A mount: Purulent Serosanguineous N/A Exudate Type: yellow, brown, green red, brown N/A Exudate Color: Yes No N/A Foul Odor A Cleansing: fter No N/A N/A Odor Anticipated Due to Product Use: Well defined, not attached Distinct, outline attached N/A Wound Margin: None Present (0%) Large (67-100%) N/A Granulation A mount: N/A Red, Friable N/A Granulation Quality: Large (67-100%) None Present (0%) N/A Necrotic Amount: Adherent Slough N/A N/A Necrotic Tissue: Fat Layer (Subcutaneous Tissue): Yes Fat Layer (Subcutaneous Tissue): Yes N/A Exposed Structures: Bone: Yes Fascia: No Fascia: No Tendon: No Tendon: No Muscle: No Muscle: No Joint: No Joint: No Bone: No None None N/A Epithelialization: Treatment Notes Electronic Signature(s) Signed: 09/27/2020 5:27:47 PM By: [name] Signed: 09/27/2020 5:45:41 PM By: Linton Ham MD Entered By: Linton Ham on 09/27/2020 11:04:51 -------------------------------------------------------------------------------- Multi-Disciplinary Care Plan Details Patient Name: Date of Service: [name] 09/27/2020 9:45 A M Medical Record Number: [id number] Patient Account Number: [id number] Date of Birth/Sex: Treating  RN: 1946/02/24 (75 y.o. [name], [place] Primary Care Camile Esters: [name], [name] Other Clinician: Referring Bentlee Benningfield: Treating Chantilly Linskey/Extender: [name], [name] [name], [name] Weeks in Treatment: Cando reviewed with physician Active Inactive Wound/Skin Impairment Nursing Diagnoses: Impaired tissue integrity Goals: Patient/caregiver will verbalize understanding of skin care regimen Date Initiated: 11/19/2017 Target Resolution Date: 10/21/2020 Goal Status: Active Ulcer/skin breakdown will have a volume reduction of 30% by week 4 Date Initiated: 11/19/2017 Date Inactivated: 12/02/2017 Target Resolution Date: 12/22/2017 Goal Status: Unmet Unmet Reason: multipl comorbidities Interventions: Assess patient/caregiver ability to obtain necessary supplies Assess ulceration(s) every visit Provide education on ulcer and skin care Notes: Electronic Signature(s) Signed: 09/27/2020 5:27:47 PM By: [name] Entered By: [name] on 09/27/2020 10:48:19 -------------------------------------------------------------------------------- Pain Assessment Details Patient Name: Date of Service: [name] 09/27/2020 9:45 A M Medical Record Number: [id number] Patient Account Number: [id number] Date of Birth/Sex: Treating RN: November 02, 1945 (75 y.o. [name] Primary Care Kaedence Connelly: [name], [name] Other Clinician: Referring Macyn Remmert: Treating Indya Oliveria/Extender: [name], [name] [name], [name] Weeks in Treatment: 232 Active Problems Location of Pain Severity and Description of Pain Patient Has Paino No Site Locations Rate the pain. Current Pain Level: 0 Pain Management and Medication Current Pain Management: Medication: No Cold Application: No Rest: No Massage: No Activity: No T.E.N.S.: No Heat Application:  prior to applying a clean dressing using gauze sponges, not tissue or cotton balls. Soap and Water Discharge Instruction: May shower and wash wound with dial antibacterial soap and water prior to dressing change. Peri-Wound Care Triamcinolone 15 (g) Discharge Instruction: Use triamcinolone 15 (g) as directed Sween Lotion (Moisturizing lotion) Discharge Instruction: Apply moisturizing lotion as directed Topical Primary Dressing KerraCel Ag Gelling Fiber Dressing, 4x5 in (silver alginate) Discharge Instruction: Apply silver alginate to wound bed as  instructed Secondary Dressing Woven Gauze Sponge, Non-Sterile 4x4 in Discharge Instruction: Apply over primary dressing as directed. ABD Pad, 5x9 Discharge Instruction: Apply over primary dressing as directed. Secured With Compression Wrap Kerlix Roll 4.5x3.1 (in/yd) Discharge Instruction: Apply Kerlix and Coban compression as directed. Coban Self-Adherent Wrap 4x5 (in/yd) Discharge Instruction: Apply over Kerlix as directed. Compression Stockings Add-Ons Electronic Signature(s) Signed: 09/27/2020 5:03:12 PM By: Sandre Kitty Signed: 09/27/2020 5:39:03 PM By: Deon Pilling Entered By: Sandre Kitty on 09/27/2020 15:28:32 -------------------------------------------------------------------------------- Wound Assessment Details Patient Name: Date of Service: [name] 09/27/2020 9:45 A M Medical Record Number: [id number] Patient Account Number: [id number] Date of Birth/Sex: Treating RN: [birth year] (75 y.o. [name] Primary Care Germaine Ripp: [name], [name] Other Clinician: Referring Patriciann Becht: Treating Laurabelle Gorczyca/Extender: [name], [name] [name], [name] Weeks in Treatment: 232 Wound Status Wound Number: 28 Primary Diabetic Wound/Ulcer of the Lower Extremity Etiology: Wound Location: Right, Dorsal Foot Wound Open Wounding Event: Gradually Appeared Status: Date Acquired: 04/26/2020 Comorbid Anemia, Sleep Apnea, Arrhythmia, Congestive Heart Failure, Weeks Of Treatment: 22 History: Coronary Artery Disease, Hypertension, Myocardial Infarction, Clustered Wound: Yes Type II Diabetes, Gout, Neuropathy Photos Wound Measurements Length: (cm) 0.1 Width: (cm) 0.1 Depth: (cm) 0.1 Clustered Quantity: 1 Area: (cm) 0.008 Volume: (cm) 0.001 % Reduction in Area: 99.2% % Reduction in Volume: 99% Epithelialization: Large (67-100%) Tunneling: No Undermining: No Wound Description Classification: Grade 1 Wound Margin: Distinct, outline attached Exudate Amount: None  Present Foul Odor After Cleansing: No Slough/Fibrino No Wound Bed Granulation Amount: None Present (0%) Exposed Structure Necrotic Amount: None Present (0%) Fascia Exposed: No Fat Layer (Subcutaneous Tissue) Exposed: Yes Tendon Exposed: No Muscle Exposed: No Joint Exposed: No Bone Exposed: No Treatment Notes Wound #28 (Foot) Wound Laterality: Dorsal, Right Cleanser Wound Cleanser Discharge Instruction: Cleanse the wound with wound cleanser prior to applying a clean dressing using gauze sponges, not tissue or cotton balls. Soap and Water Discharge Instruction: May shower and wash wound with dial antibacterial soap and water prior to dressing change. Peri-Wound Care Triamcinolone 15 (g) Discharge Instruction: Use triamcinolone 15 (g) as directed Sween Lotion (Moisturizing lotion) Discharge Instruction: Apply moisturizing lotion as directed Topical Primary Dressing KerraCel Ag Gelling Fiber Dressing, 4x5 in (silver alginate) Discharge Instruction: Apply silver alginate to wound bed as instructed Secondary Dressing Woven Gauze Sponge, Non-Sterile 4x4 in Discharge Instruction: Apply over primary dressing as directed. ABD Pad, 5x9 Discharge Instruction: Apply over primary dressing as directed. Secured With Compression Wrap Kerlix Roll 4.5x3.1 (in/yd) Discharge Instruction: Apply Kerlix and Coban compression as directed. Coban Self-Adherent Wrap 4x5 (in/yd) Discharge Instruction: Apply over Kerlix as directed. Compression Stockings Add-Ons Electronic Signature(s) Signed: 09/27/2020 5:03:12 PM By: Sandre Kitty Signed: 09/27/2020 5:39:03 PM By: Deon Pilling Entered By: Sandre Kitty on 09/27/2020 15:24:33 -------------------------------------------------------------------------------- Wound Assessment Details Patient Name: Date of Service: [name] 09/27/2020 9:45 A M Medical Record Number: [id number] Patient Account Number: [id number] Date of  Birth/Sex: Treating RN: Oct 01, 1945 (75 y.o. [name] Primary Care Kamirah Shugrue: [name], [name] Other Clinician: Referring Terril Chestnut: Treating Verginia Toohey/Extender:  [name], [name] [name], [name] Weeks in Treatment: 232 Wound Status Wound Number: 34 Primary Diabetic Wound/Ulcer of the Lower Extremity Etiology: Wound Location: Right, Medial, Dorsal Foot Wound Open Wounding Event: Gradually Appeared Status: Date Acquired: 06/14/2020 Comorbid Anemia, Sleep Apnea, Arrhythmia, Congestive Heart Failure, Weeks Of Treatment: 15 History: Coronary Artery Disease, Hypertension, Myocardial Infarction, Clustered Wound: No Type II Diabetes, Gout, Neuropathy Photos Wound Measurements Length: (cm) 0.2 Width: (cm) 0.2 Depth: (cm) 0.1 Area: (cm) 0.031 Volume: (cm) 0.003 % Reduction in Area: 88.7% % Reduction in Volume: 88.9% Epithelialization: Large (67-100%) Tunneling: No Undermining: No Wound Description Classification: Grade 2 Wound Margin: Well defined, not attached Exudate Amount: None Present Foul Odor After Cleansing: No Slough/Fibrino Yes Wound Bed Granulation Amount: None Present (0%) Exposed Structure Necrotic Amount: None Present (0%) Fascia Exposed: No Fat Layer (Subcutaneous Tissue) Exposed: Yes Tendon Exposed: No Muscle Exposed: No Joint Exposed: No Bone Exposed: No Treatment Notes Wound #34 (Foot) Wound Laterality: Dorsal, Right, Medial Cleanser Wound Cleanser Discharge Instruction: Cleanse the wound with wound cleanser prior to applying a clean dressing using gauze sponges, not tissue or cotton balls. Soap and Water Discharge Instruction: May shower and wash wound with dial antibacterial soap and water prior to dressing change. Peri-Wound Care Triamcinolone 15 (g) Discharge Instruction: Use triamcinolone 15 (g) as directed Sween Lotion (Moisturizing lotion) Discharge Instruction: Apply moisturizing lotion as directed Topical Primary Dressing KerraCel Ag  Gelling Fiber Dressing, 4x5 in (silver alginate) Discharge Instruction: Apply silver alginate to wound bed as instructed Secondary Dressing Woven Gauze Sponge, Non-Sterile 4x4 in Discharge Instruction: Apply over primary dressing as directed. ABD Pad, 5x9 Discharge Instruction: Apply over primary dressing as directed. Secured With Compression Wrap Kerlix Roll 4.5x3.1 (in/yd) Discharge Instruction: Apply Kerlix and Coban compression as directed. Coban Self-Adherent Wrap 4x5 (in/yd) Discharge Instruction: Apply over Kerlix as directed. Compression Stockings Add-Ons Electronic Signature(s) Signed: 09/27/2020 5:03:12 PM By: Sandre Kitty Signed: 09/27/2020 5:39:03 PM By: Deon Pilling Entered By: Sandre Kitty on 09/27/2020 15:25:06 -------------------------------------------------------------------------------- Wound Assessment Details Patient Name: Date of Service: [name] 09/27/2020 9:45 A M Medical Record Number: [id number] Patient Account Number: [id number] Date of Birth/Sex: Treating RN: 01-28-1946 (75 y.o. [name] Primary Care Loveah Like: [name], [name] Other Clinician: Referring Jude Linck: Treating Dominyck Reser/Extender: [name], [name] [name], [name] Weeks in Treatment: 232 Wound Status Wound Number: 35 Primary Diabetic Wound/Ulcer of the Lower Extremity Etiology: Wound Location: Left, Lateral, Dorsal Foot Wound Open Wounding Event: Gradually Appeared Status: Date Acquired: 05/24/2020 Comorbid Anemia, Sleep Apnea, Arrhythmia, Congestive Heart Failure, Weeks Of Treatment: 15 History: Coronary Artery Disease, Hypertension, Myocardial Infarction, Clustered Wound: No Type II Diabetes, Gout, Neuropathy Photos Wound Measurements Length: (cm) 1.5 Width: (cm) 1.3 Depth: (cm) 0.1 Area: (cm) 1.532 Volume: (cm) 0.153 % Reduction in Area: -225.3% % Reduction in Volume: 35.2% Epithelialization: None Tunneling: No Undermining: No Wound  Description Classification: Grade 2 Wound Margin: Well defined, not attached Exudate Amount: Small Exudate Type: Serosanguineous Exudate Color: red, brown Foul Odor After Cleansing: No Slough/Fibrino Yes Wound Bed Granulation Amount: Large (67-100%) Exposed Structure Granulation Quality: Red Fascia Exposed: No Necrotic Amount: Small (1-33%) Fat Layer (Subcutaneous Tissue) Exposed: Yes Necrotic Quality: Adherent Slough Tendon Exposed: No Muscle Exposed: No Joint Exposed: No Bone Exposed: Yes Treatment Notes Wound #35 (Foot) Wound Laterality: Dorsal, Left, Lateral Cleanser Wound Cleanser Discharge Instruction: Cleanse the wound with wound cleanser prior to applying a clean dressing using gauze sponges, not tissue or cotton balls. Soap and Water Discharge Instruction: May shower and wash wound  JES, [place] ([id number]) Visit Report for 09/27/2020 Arrival Information Details Patient Name: Date of Service: [name] [facility] F. 09/27/2020 9:45 A M Medical Record Number: [id number] Patient Account Number: [id number] Date of Birth/Sex: Treating RN: [birth year] (75 y.o. [name], Tammi Klippel Primary Care Sharryn Belding: [name], [name] Other Clinician: Referring Rashaunda Rahl: Treating Xela Oregel/Extender: [name], [name] [name], [name] Weeks in Treatment: 81 Visit Information History Since Last Visit All ordered tests and consults were completed: Yes Patient Arrived: Wheel Chair Added or deleted any medications: No Arrival Time: 10:10 Any new allergies or adverse reactions: No Accompanied By: self Had a fall or experienced change in No Transfer Assistance: None activities of daily living that may affect Patient Identification Verified: Yes risk of falls: Secondary Verification Process Completed: Yes Signs or symptoms of abuse/neglect since last visito No Patient Requires Transmission-Based Precautions: No Hospitalized since last visit: No Patient Has Alerts: Yes Implantable device outside of the clinic excluding No Patient Alerts: Patient on Blood Thinner cellular tissue based products placed in the center left ABI 1.0; rt ABI 1.09 since last visit: Has Dressing in Place as Prescribed: Yes Has Compression in Place as Prescribed: Yes Pain Present Now: No Notes Per patient spoke with [facility] this morning over phone and seen physician last week. Electronic Signature(s) Signed: 09/27/2020 5:39:03 PM By: Deon Pilling Entered By: Deon Pilling on 09/27/2020 10:19:14 -------------------------------------------------------------------------------- Clinic Level of Care Assessment Details Patient Name: Date of Service: [name] 09/27/2020 9:45 A M Medical Record Number: [id number] Patient Account Number: [id number] Date of Birth/Sex: Treating RN: 1945/06/17 (75 y.o. [name], [place] Primary Care Deshone Lyssy: [name], [name] Other Clinician: Referring Clayvon Parlett: Treating Tabius Rood/Extender: [name], [name] [name], [name] Weeks in Treatment: 232 Clinic Level of Care Assessment Items TOOL 4 Quantity Score X- 1 0 Use when only an EandM is performed on FOLLOW-UP visit ASSESSMENTS - Nursing Assessment / Reassessment X- 1 10 Reassessment of Co-morbidities (includes updates in patient status) X- 1 5 Reassessment of Adherence to Treatment Plan ASSESSMENTS - Wound and Skin A ssessment / Reassessment '[]'$  - 0 Simple Wound Assessment / Reassessment - one wound X- 10 5 Complex Wound Assessment / Reassessment - multiple wounds '[]'$  - 0 Dermatologic / Skin Assessment (not related to wound area) ASSESSMENTS - Focused Assessment X- 3 5 Circumferential Edema Measurements - multi extremities '[]'$  - 0 Nutritional Assessment / Counseling / Intervention '[]'$  - 0 Lower Extremity Assessment (monofilament, tuning fork, pulses) '[]'$  - 0 Peripheral Arterial Disease Assessment (using hand held doppler) ASSESSMENTS - Ostomy and/or Continence Assessment and Care '[]'$  - 0 Incontinence Assessment and Management '[]'$  - 0 Ostomy Care Assessment and Management (repouching, etc.) PROCESS - Coordination of Care '[]'$  - 0 Simple Patient / Family Education for ongoing care X- 1 20 Complex (extensive) Patient / Family Education for ongoing care X- 1 10 Staff obtains Programmer, systems, Records, T Results / Process Orders est '[]'$  - 0 Staff telephones HHA, Nursing Homes / Clarify orders / etc '[]'$  - 0 Routine Transfer to another Facility (non-emergent condition) '[]'$  - 0 Routine Hospital Admission (non-emergent condition) '[]'$  - 0 New Admissions / Biomedical engineer / Ordering NPWT Apligraf, etc. , '[]'$  - 0 Emergency Hospital Admission (emergent condition) '[]'$  - 0 Simple Discharge Coordination X- 1 15 Complex (extensive) Discharge Coordination PROCESS - Special Needs '[]'$  - 0 Pediatric / Minor  Patient Management '[]'$  - 0 Isolation Patient Management '[]'$  - 0 Hearing / Language / Visual special needs '[]'$  - 0 Assessment of Community assistance (transportation, D/C
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
## 2020-09-28 ENCOUNTER — Other Ambulatory Visit: Payer: Self-pay | Admitting: Family Medicine

## 2020-10-05 ENCOUNTER — Telehealth: Payer: Self-pay

## 2020-10-05 NOTE — Telephone Encounter (Signed)
Pts daughter called to inform that pt has been exposed to Covid from household members. Pt is scheduled for Left AKA surgery on 10/10/20 with Dr. Carlis Abbott. Informed Dr. Carlis Abbott who advised for pt to have same day covid testing and will most likely proceed with surgery as planned if positive d/t condition. Daughter will contact office if pt develops any symptoms in the meantime.

## 2020-10-06 ENCOUNTER — Other Ambulatory Visit (HOSPITAL_COMMUNITY)
Admission: RE | Admit: 2020-10-06 | Discharge: 2020-10-06 | Disposition: A | Discharge status: Home / Self Care | Payer: Medicare HMO | Source: Ambulatory Visit | Attending: Vascular Surgery | Admitting: Vascular Surgery

## 2020-10-06 DIAGNOSIS — Z20822 Contact with and (suspected) exposure to covid-19: Secondary | ICD-10-CM | POA: Insufficient documentation

## 2020-10-06 DIAGNOSIS — Z01812 Encounter for preprocedural laboratory examination: Secondary | ICD-10-CM | POA: Diagnosis not present

## 2020-10-06 LAB — SARS CORONAVIRUS 2 (TAT 6-24 HRS): SARS Coronavirus 2: NEGATIVE

## 2020-10-07 ENCOUNTER — Other Ambulatory Visit: Payer: Self-pay

## 2020-10-07 ENCOUNTER — Encounter: Payer: Self-pay | Admitting: Emergency Medicine

## 2020-10-07 ENCOUNTER — Encounter (HOSPITAL_COMMUNITY): Payer: Self-pay | Admitting: Vascular Surgery

## 2020-10-07 NOTE — Progress Notes (Addendum)
I spoke to Mr. Rindal and his daughter [name]. Mr. Nicolosi denies chest pain or shortness of breath. Mr. Howick tested negative on 10/06/20, everyone in the home - 6 total, have tested positive or Covid, beginning 09/29/20, Mrs. Grajeda tested positive on 10/05/20. [name], patient's care giver tested positive on 10/01/20- she states she feels fine.  Mr. Claudio has type II diabetes, patient states that CBG's run 110-112, CBG has dropped to 29, per [name]. Lantus was decreased to 3 units at HS. I gave instructions to take 1 unit of Lantus Sunday @ HS. I instructed patient to check CBG after awaking and every 2 hours until arrival  to the hospital.  I Instructed patient if CBG is less than 70 to take 4 Glucose Tablets or 1 tube of Glucose Gel or 1/2 cup of a clear juice. Recheck CBG in 15 minutes if CBG is not over 70 call, pre- op desk at [phone] for further instructions.

## 2020-10-07 NOTE — Progress Notes (Unsigned)
[facility]  Patient Information: Name:  [name] [name]  DOB:  [birth year]  MRN:  [id number]  [name] Hefty, RN  P [facility] Heart[facility] Device Planned Procedure:  left BKA  Surgeon:  Dr Loletha Grayer.Donzetta Matters  Date of Procedure:  10/10/20  Cautery will be used.  Position during surgery:  Supine   Please send documentation back to:  [facility] (Fax # [phone])   Hope Budds, RN  10/07/2020 9:43 AM   Device Information: Clinic EP Physician:  Cristopher Peru, MD   Device Type:  Defibrillator Manufacturer and Phone #:  [employer]/Abbott: [phone] Pacemaker Dependent?:  No. Date of Last Device Check:  08/18/20 Normal Device Function?:  yes  Electrophysiologist's Recommendations:  Have magnet available. Provide continuous ECG monitoring when magnet is used or reprogramming is to be performed.  Procedure may interfere with device function.  Magnet should be placed over device during procedure.  Per Device Clinic Standing Orders, Shirlee More, RN  5:26 PM 10/07/2020

## 2020-10-07 NOTE — Progress Notes (Signed)
[name] [name]

## 2020-10-10 ENCOUNTER — Encounter (HOSPITAL_COMMUNITY): Payer: Self-pay | Admitting: Vascular Surgery

## 2020-10-10 ENCOUNTER — Inpatient Hospital Stay (HOSPITAL_COMMUNITY)
Admission: RE | Admit: 2020-10-10 | Discharge: 2020-10-13 | DRG: 240 | Disposition: A | Discharge status: Home Health | Payer: Medicare HMO | Attending: Family Medicine | Admitting: Family Medicine

## 2020-10-10 ENCOUNTER — Inpatient Hospital Stay (HOSPITAL_COMMUNITY): Payer: Medicare HMO

## 2020-10-10 ENCOUNTER — Other Ambulatory Visit: Payer: Self-pay

## 2020-10-10 ENCOUNTER — Encounter (HOSPITAL_COMMUNITY)
Admission: RE | Disposition: A | Discharge status: Home Health | Payer: Self-pay | Source: Home / Self Care | Attending: Family Medicine

## 2020-10-10 ENCOUNTER — Inpatient Hospital Stay (HOSPITAL_COMMUNITY): Payer: Medicare HMO | Admitting: Anesthesiology

## 2020-10-10 DIAGNOSIS — Z882 Allergy status to sulfonamides status: Secondary | ICD-10-CM

## 2020-10-10 DIAGNOSIS — Z825 Family history of asthma and other chronic lower respiratory diseases: Secondary | ICD-10-CM

## 2020-10-10 DIAGNOSIS — R011 Cardiac murmur, unspecified: Secondary | ICD-10-CM | POA: Diagnosis present

## 2020-10-10 DIAGNOSIS — E1142 Type 2 diabetes mellitus with diabetic polyneuropathy: Secondary | ICD-10-CM | POA: Diagnosis present

## 2020-10-10 DIAGNOSIS — Z978 Presence of other specified devices: Secondary | ICD-10-CM | POA: Diagnosis not present

## 2020-10-10 DIAGNOSIS — E11622 Type 2 diabetes mellitus with other skin ulcer: Secondary | ICD-10-CM | POA: Diagnosis present

## 2020-10-10 DIAGNOSIS — S81809A Unspecified open wound, unspecified lower leg, initial encounter: Secondary | ICD-10-CM

## 2020-10-10 DIAGNOSIS — I214 Non-ST elevation (NSTEMI) myocardial infarction: Secondary | ICD-10-CM | POA: Diagnosis not present

## 2020-10-10 DIAGNOSIS — Z20822 Contact with and (suspected) exposure to covid-19: Secondary | ICD-10-CM | POA: Diagnosis present

## 2020-10-10 DIAGNOSIS — I5042 Chronic combined systolic (congestive) and diastolic (congestive) heart failure: Secondary | ICD-10-CM | POA: Diagnosis present

## 2020-10-10 DIAGNOSIS — N1832 Chronic kidney disease, stage 3b: Secondary | ICD-10-CM | POA: Diagnosis present

## 2020-10-10 DIAGNOSIS — E1122 Type 2 diabetes mellitus with diabetic chronic kidney disease: Secondary | ICD-10-CM | POA: Diagnosis not present

## 2020-10-10 DIAGNOSIS — Z8614 Personal history of Methicillin resistant Staphylococcus aureus infection: Secondary | ICD-10-CM

## 2020-10-10 DIAGNOSIS — I472 Ventricular tachycardia: Secondary | ICD-10-CM | POA: Diagnosis present

## 2020-10-10 DIAGNOSIS — Z7902 Long term (current) use of antithrombotics/antiplatelets: Secondary | ICD-10-CM

## 2020-10-10 DIAGNOSIS — I70262 Atherosclerosis of native arteries of extremities with gangrene, left leg: Secondary | ICD-10-CM | POA: Diagnosis not present

## 2020-10-10 DIAGNOSIS — Z91048 Other nonmedicinal substance allergy status: Secondary | ICD-10-CM

## 2020-10-10 DIAGNOSIS — L97929 Non-pressure chronic ulcer of unspecified part of left lower leg with unspecified severity: Secondary | ICD-10-CM | POA: Diagnosis present

## 2020-10-10 DIAGNOSIS — E785 Hyperlipidemia, unspecified: Secondary | ICD-10-CM | POA: Diagnosis present

## 2020-10-10 DIAGNOSIS — I252 Old myocardial infarction: Secondary | ICD-10-CM

## 2020-10-10 DIAGNOSIS — I2510 Atherosclerotic heart disease of native coronary artery without angina pectoris: Secondary | ICD-10-CM | POA: Diagnosis present

## 2020-10-10 DIAGNOSIS — I130 Hypertensive heart and chronic kidney disease with heart failure and stage 1 through stage 4 chronic kidney disease, or unspecified chronic kidney disease: Secondary | ICD-10-CM | POA: Diagnosis not present

## 2020-10-10 DIAGNOSIS — E1169 Type 2 diabetes mellitus with other specified complication: Secondary | ICD-10-CM | POA: Diagnosis not present

## 2020-10-10 DIAGNOSIS — Z95810 Presence of automatic (implantable) cardiac defibrillator: Secondary | ICD-10-CM

## 2020-10-10 DIAGNOSIS — Z8261 Family history of arthritis: Secondary | ICD-10-CM

## 2020-10-10 DIAGNOSIS — M6250 Muscle wasting and atrophy, not elsewhere classified, unspecified site: Secondary | ICD-10-CM | POA: Diagnosis present

## 2020-10-10 DIAGNOSIS — L089 Local infection of the skin and subcutaneous tissue, unspecified: Secondary | ICD-10-CM | POA: Diagnosis not present

## 2020-10-10 DIAGNOSIS — N401 Enlarged prostate with lower urinary tract symptoms: Secondary | ICD-10-CM | POA: Diagnosis present

## 2020-10-10 DIAGNOSIS — L89151 Pressure ulcer of sacral region, stage 1: Secondary | ICD-10-CM | POA: Diagnosis not present

## 2020-10-10 DIAGNOSIS — M86672 Other chronic osteomyelitis, left ankle and foot: Secondary | ICD-10-CM | POA: Diagnosis present

## 2020-10-10 DIAGNOSIS — I69351 Hemiplegia and hemiparesis following cerebral infarction affecting right dominant side: Secondary | ICD-10-CM | POA: Diagnosis not present

## 2020-10-10 DIAGNOSIS — M8580 Other specified disorders of bone density and structure, unspecified site: Secondary | ICD-10-CM | POA: Diagnosis present

## 2020-10-10 DIAGNOSIS — Z960 Presence of urogenital implants: Secondary | ICD-10-CM | POA: Diagnosis present

## 2020-10-10 DIAGNOSIS — M8660 Other chronic osteomyelitis, unspecified site: Secondary | ICD-10-CM

## 2020-10-10 DIAGNOSIS — R8281 Pyuria: Secondary | ICD-10-CM

## 2020-10-10 DIAGNOSIS — E1151 Type 2 diabetes mellitus with diabetic peripheral angiopathy without gangrene: Principal | ICD-10-CM | POA: Diagnosis present

## 2020-10-10 DIAGNOSIS — S81802A Unspecified open wound, left lower leg, initial encounter: Secondary | ICD-10-CM | POA: Diagnosis present

## 2020-10-10 DIAGNOSIS — J9811 Atelectasis: Secondary | ICD-10-CM | POA: Diagnosis not present

## 2020-10-10 DIAGNOSIS — I96 Gangrene, not elsewhere classified: Secondary | ICD-10-CM | POA: Diagnosis not present

## 2020-10-10 DIAGNOSIS — N320 Bladder-neck obstruction: Secondary | ICD-10-CM | POA: Diagnosis present

## 2020-10-10 DIAGNOSIS — Z993 Dependence on wheelchair: Secondary | ICD-10-CM | POA: Diagnosis not present

## 2020-10-10 DIAGNOSIS — E1121 Type 2 diabetes mellitus with diabetic nephropathy: Secondary | ICD-10-CM

## 2020-10-10 DIAGNOSIS — N319 Neuromuscular dysfunction of bladder, unspecified: Secondary | ICD-10-CM | POA: Diagnosis present

## 2020-10-10 DIAGNOSIS — Z794 Long term (current) use of insulin: Secondary | ICD-10-CM

## 2020-10-10 DIAGNOSIS — Z7982 Long term (current) use of aspirin: Secondary | ICD-10-CM

## 2020-10-10 DIAGNOSIS — I509 Heart failure, unspecified: Secondary | ICD-10-CM

## 2020-10-10 DIAGNOSIS — Z79899 Other long term (current) drug therapy: Secondary | ICD-10-CM

## 2020-10-10 DIAGNOSIS — L8990 Pressure ulcer of unspecified site, unspecified stage: Secondary | ICD-10-CM | POA: Insufficient documentation

## 2020-10-10 DIAGNOSIS — M109 Gout, unspecified: Secondary | ICD-10-CM | POA: Diagnosis present

## 2020-10-10 DIAGNOSIS — I420 Dilated cardiomyopathy: Secondary | ICD-10-CM | POA: Diagnosis present

## 2020-10-10 DIAGNOSIS — I255 Ischemic cardiomyopathy: Secondary | ICD-10-CM | POA: Diagnosis present

## 2020-10-10 DIAGNOSIS — N390 Urinary tract infection, site not specified: Secondary | ICD-10-CM | POA: Diagnosis present

## 2020-10-10 DIAGNOSIS — N183 Chronic kidney disease, stage 3 unspecified: Secondary | ICD-10-CM | POA: Diagnosis present

## 2020-10-10 DIAGNOSIS — Z8249 Family history of ischemic heart disease and other diseases of the circulatory system: Secondary | ICD-10-CM

## 2020-10-10 DIAGNOSIS — L97129 Non-pressure chronic ulcer of left thigh with unspecified severity: Secondary | ICD-10-CM | POA: Diagnosis not present

## 2020-10-10 HISTORY — DX: Personal history of urinary calculi: Z87.442

## 2020-10-10 HISTORY — DX: Paralytic syndrome, unspecified: G83.9

## 2020-10-10 HISTORY — PX: AMPUTATION: SHX166

## 2020-10-10 LAB — APTT: aPTT: 33 seconds (ref 24–36)

## 2020-10-10 LAB — URINALYSIS, ROUTINE W REFLEX MICROSCOPIC
Bilirubin Urine: NEGATIVE
Glucose, UA: NEGATIVE mg/dL
Ketones, ur: NEGATIVE mg/dL
Nitrite: NEGATIVE
Protein, ur: 100 mg/dL — AB
Specific Gravity, Urine: 1.016 (ref 1.005–1.030)
WBC, UA: >50 WBC/hpf — ABNORMAL HIGH (ref 0–5)
pH: 7 (ref 5.0–8.0)

## 2020-10-10 LAB — COMPREHENSIVE METABOLIC PANEL
ALT: 11 U/L (ref 0–44)
AST: 20 U/L (ref 15–41)
Albumin: 2 g/dL — ABNORMAL LOW (ref 3.5–5.0)
Alkaline Phosphatase: 106 U/L (ref 38–126)
Anion gap: 5 (ref 5–15)
BUN: 24 mg/dL — ABNORMAL HIGH (ref 8–23)
CO2: 29 mmol/L (ref 22–32)
Calcium: 7.9 mg/dL — ABNORMAL LOW (ref 8.9–10.3)
Chloride: 109 mmol/L (ref 98–111)
Creatinine, Ser: 1.78 mg/dL — ABNORMAL HIGH (ref 0.61–1.24)
GFR, Estimated: 40 mL/min — ABNORMAL LOW (ref >60)
Glucose, Bld: 124 mg/dL — ABNORMAL HIGH (ref 70–99)
Potassium: 4.1 mmol/L (ref 3.5–5.1)
Sodium: 143 mmol/L (ref 135–145)
Total Bilirubin: 0.7 mg/dL (ref 0.3–1.2)
Total Protein: 6.8 g/dL (ref 6.5–8.1)

## 2020-10-10 LAB — CBC
HCT: 37.2 % — ABNORMAL LOW (ref 39.0–52.0)
Hemoglobin: 10.9 g/dL — ABNORMAL LOW (ref 13.0–17.0)
MCH: 25.6 pg — ABNORMAL LOW (ref 26.0–34.0)
MCHC: 29.3 g/dL — ABNORMAL LOW (ref 30.0–36.0)
MCV: 87.3 fL (ref 80.0–100.0)
Platelets: 209 10*3/uL (ref 150–400)
RBC: 4.26 MIL/uL (ref 4.22–5.81)
RDW: 16.6 % — ABNORMAL HIGH (ref 11.5–15.5)
WBC: 9.3 10*3/uL (ref 4.0–10.5)
nRBC: 0 % (ref 0.0–0.2)

## 2020-10-10 LAB — TYPE AND SCREEN
ABO/RH(D): O POS
Antibody Screen: NEGATIVE

## 2020-10-10 LAB — GLUCOSE, CAPILLARY
Glucose-Capillary: 132 mg/dL — ABNORMAL HIGH (ref 70–99)
Glucose-Capillary: 132 mg/dL — ABNORMAL HIGH (ref 70–99)
Glucose-Capillary: 113 mg/dL — ABNORMAL HIGH (ref 70–99)
Glucose-Capillary: 135 mg/dL — ABNORMAL HIGH (ref 70–99)
Glucose-Capillary: 148 mg/dL — ABNORMAL HIGH (ref 70–99)
Glucose-Capillary: 186 mg/dL — ABNORMAL HIGH (ref 70–99)

## 2020-10-10 LAB — PROTIME-INR
INR: 1.4 — ABNORMAL HIGH (ref 0.8–1.2)
Prothrombin Time: 16.9 seconds — ABNORMAL HIGH (ref 11.4–15.2)

## 2020-10-10 SURGERY — AMPUTATION, ABOVE KNEE
Anesthesia: General | Site: Leg Upper | Laterality: Left

## 2020-10-10 MED ORDER — ASPIRIN EC 81 MG PO TBEC
81.0000 mg | DELAYED_RELEASE_TABLET | Freq: Every day | ORAL | Status: DC
  Administered 2020-10-11 – 2020-10-13 (×3): 81 mg via ORAL
  Filled 2020-10-10 (×3): qty 1

## 2020-10-10 MED ORDER — MIDAZOLAM HCL 2 MG/2ML IJ SOLN: 0.5000 mg | Freq: Once | INTRAMUSCULAR | Status: DC | PRN

## 2020-10-10 MED ORDER — PHENYLEPHRINE 40 MCG/ML (10ML) SYRINGE FOR IV PUSH (FOR BLOOD PRESSURE SUPPORT)
PREFILLED_SYRINGE | INTRAVENOUS | Status: AC
  Filled 2020-10-10: qty 10

## 2020-10-10 MED ORDER — CHLORHEXIDINE GLUCONATE CLOTH 2 % EX PADS: 6.0000 | MEDICATED_PAD | Freq: Once | CUTANEOUS | Status: DC

## 2020-10-10 MED ORDER — INSULIN ASPART 100 UNIT/ML IJ SOLN
0.0000 [IU] | Freq: Three times a day (TID) | INTRAMUSCULAR | Status: DC
  Administered 2020-10-10: 1 [IU] via SUBCUTANEOUS
  Administered 2020-10-11 (×3): 2 [IU] via SUBCUTANEOUS
  Administered 2020-10-12: 1 [IU] via SUBCUTANEOUS
  Administered 2020-10-13: 3 [IU] via SUBCUTANEOUS

## 2020-10-10 MED ORDER — FENTANYL CITRATE (PF) 250 MCG/5ML IJ SOLN
INTRAMUSCULAR | Status: AC
  Filled 2020-10-10: qty 5

## 2020-10-10 MED ORDER — ADULT MULTIVITAMIN W/MINERALS CH
1.0000 | ORAL_TABLET | Freq: Every day | ORAL | Status: DC
  Administered 2020-10-11 – 2020-10-13 (×3): 1 via ORAL
  Filled 2020-10-10 (×3): qty 1

## 2020-10-10 MED ORDER — LIDOCAINE 2% (20 MG/ML) 5 ML SYRINGE
INTRAMUSCULAR | Status: DC | PRN
  Administered 2020-10-10: 20 mg via INTRAVENOUS

## 2020-10-10 MED ORDER — CEFAZOLIN SODIUM-DEXTROSE 2-4 GM/100ML-% IV SOLN
2.0000 g | INTRAVENOUS | Status: AC
  Administered 2020-10-10: 2 g via INTRAVENOUS

## 2020-10-10 MED ORDER — MAGNESIUM GLUCONATE 500 MG PO TABS
500.0000 mg | ORAL_TABLET | ORAL | Status: DC
  Administered 2020-10-11 – 2020-10-13 (×2): 500 mg via ORAL
  Filled 2020-10-10 (×2): qty 1

## 2020-10-10 MED ORDER — ORAL CARE MOUTH RINSE: 15.0000 mL | Freq: Once | OROMUCOSAL | Status: AC

## 2020-10-10 MED ORDER — AMIODARONE HCL 200 MG PO TABS
200.0000 mg | ORAL_TABLET | Freq: Every day | ORAL | Status: DC
  Administered 2020-10-11 – 2020-10-13 (×3): 200 mg via ORAL
  Filled 2020-10-10 (×3): qty 1

## 2020-10-10 MED ORDER — PROMETHAZINE HCL 25 MG/ML IJ SOLN
6.2500 mg | INTRAMUSCULAR | Status: DC | PRN
Start: 2020-10-10 — End: 2020-10-10

## 2020-10-10 MED ORDER — ROSUVASTATIN CALCIUM 20 MG PO TABS
40.0000 mg | ORAL_TABLET | Freq: Every day | ORAL | Status: DC
  Administered 2020-10-11 – 2020-10-13 (×3): 40 mg via ORAL
  Filled 2020-10-10 (×3): qty 2

## 2020-10-10 MED ORDER — ONDANSETRON HCL 4 MG/2ML IJ SOLN
INTRAMUSCULAR | Status: AC
  Filled 2020-10-10: qty 2

## 2020-10-10 MED ORDER — PHENYLEPHRINE HCL-NACL 10-0.9 MG/250ML-% IV SOLN
INTRAVENOUS | Status: DC | PRN
  Administered 2020-10-10: 35 ug/min via INTRAVENOUS

## 2020-10-10 MED ORDER — TORSEMIDE 20 MG PO TABS
20.0000 mg | ORAL_TABLET | ORAL | Status: DC
  Administered 2020-10-11 – 2020-10-13 (×2): 20 mg via ORAL
  Filled 2020-10-10 (×2): qty 1

## 2020-10-10 MED ORDER — MIRTAZAPINE 15 MG PO TABS
15.0000 mg | ORAL_TABLET | Freq: Every day | ORAL | Status: DC
  Administered 2020-10-10 – 2020-10-13 (×4): 15 mg via ORAL
  Filled 2020-10-10 (×4): qty 1

## 2020-10-10 MED ORDER — TAMSULOSIN HCL 0.4 MG PO CAPS
0.4000 mg | ORAL_CAPSULE | Freq: Every day | ORAL | Status: DC
  Administered 2020-10-11 – 2020-10-13 (×3): 0.4 mg via ORAL
  Filled 2020-10-10 (×3): qty 1

## 2020-10-10 MED ORDER — CHLORHEXIDINE GLUCONATE 0.12 % MT SOLN
15.0000 mL | Freq: Once | OROMUCOSAL | Status: AC
  Administered 2020-10-10: 15 mL via OROMUCOSAL

## 2020-10-10 MED ORDER — SODIUM CHLORIDE 0.9 % IV SOLN: 250.0000 mL | INTRAVENOUS | Status: DC | PRN

## 2020-10-10 MED ORDER — ALBUMIN HUMAN 5 % IV SOLN: INTRAVENOUS | Status: DC | PRN

## 2020-10-10 MED ORDER — INSULIN GLARGINE 100 UNIT/ML SOLOSTAR PEN: 15.0000 [IU] | PEN_INJECTOR | Freq: Every day | SUBCUTANEOUS | Status: DC

## 2020-10-10 MED ORDER — ROCURONIUM BROMIDE 10 MG/ML (PF) SYRINGE
PREFILLED_SYRINGE | INTRAVENOUS | Status: AC
  Filled 2020-10-10: qty 10

## 2020-10-10 MED ORDER — ETOMIDATE 2 MG/ML IV SOLN
INTRAVENOUS | Status: AC
  Filled 2020-10-10: qty 20

## 2020-10-10 MED ORDER — SODIUM CHLORIDE 0.9% FLUSH: 3.0000 mL | INTRAVENOUS | Status: DC | PRN

## 2020-10-10 MED ORDER — CHLORHEXIDINE GLUCONATE 0.12 % MT SOLN
OROMUCOSAL | Status: AC
  Filled 2020-10-10: qty 15

## 2020-10-10 MED ORDER — SODIUM CHLORIDE 0.9 % IV SOLN: INTRAVENOUS | Status: DC

## 2020-10-10 MED ORDER — FINASTERIDE 5 MG PO TABS
5.0000 mg | ORAL_TABLET | Freq: Every day | ORAL | Status: DC
  Administered 2020-10-11 – 2020-10-13 (×3): 5 mg via ORAL
  Filled 2020-10-10 (×3): qty 1

## 2020-10-10 MED ORDER — LABETALOL HCL 5 MG/ML IV SOLN
INTRAVENOUS | Status: AC
  Filled 2020-10-10: qty 4

## 2020-10-10 MED ORDER — MAGNESIUM 500 MG PO TABS: 500.0000 mg | ORAL_TABLET | ORAL | Status: DC

## 2020-10-10 MED ORDER — ROCURONIUM BROMIDE 10 MG/ML (PF) SYRINGE
PREFILLED_SYRINGE | INTRAVENOUS | Status: DC | PRN
  Administered 2020-10-10: 60 mg via INTRAVENOUS

## 2020-10-10 MED ORDER — 0.9 % SODIUM CHLORIDE (POUR BTL) OPTIME
TOPICAL | Status: DC | PRN
  Administered 2020-10-10: 1000 mL

## 2020-10-10 MED ORDER — CEFAZOLIN SODIUM-DEXTROSE 2-4 GM/100ML-% IV SOLN
INTRAVENOUS | Status: AC
  Filled 2020-10-10: qty 100

## 2020-10-10 MED ORDER — FENTANYL CITRATE (PF) 250 MCG/5ML IJ SOLN
INTRAMUSCULAR | Status: DC | PRN
  Administered 2020-10-10 (×2): 100 ug via INTRAVENOUS

## 2020-10-10 MED ORDER — DEXAMETHASONE SODIUM PHOSPHATE 10 MG/ML IJ SOLN
INTRAMUSCULAR | Status: AC
  Filled 2020-10-10: qty 1

## 2020-10-10 MED ORDER — LIDOCAINE HCL (PF) 2 % IJ SOLN
INTRAMUSCULAR | Status: AC
  Filled 2020-10-10: qty 20

## 2020-10-10 MED ORDER — SODIUM CHLORIDE 0.9% FLUSH
3.0000 mL | Freq: Two times a day (BID) | INTRAVENOUS | Status: DC
  Administered 2020-10-10 – 2020-10-12 (×5): 3 mL via INTRAVENOUS

## 2020-10-10 MED ORDER — INSULIN GLARGINE 100 UNIT/ML ~~LOC~~ SOLN
15.0000 [IU] | Freq: Every day | SUBCUTANEOUS | Status: DC
  Administered 2020-10-10 – 2020-10-11 (×2): 15 [IU] via SUBCUTANEOUS
  Filled 2020-10-10 (×3): qty 0.15

## 2020-10-10 MED ORDER — ENOXAPARIN SODIUM 30 MG/0.3ML IJ SOSY
30.0000 mg | PREFILLED_SYRINGE | INTRAMUSCULAR | Status: DC
  Administered 2020-10-11 – 2020-10-13 (×3): 30 mg via SUBCUTANEOUS
  Filled 2020-10-10 (×3): qty 0.3

## 2020-10-10 MED ORDER — HYDROMORPHONE HCL 1 MG/ML IJ SOLN
0.2500 mg | INTRAMUSCULAR | Status: DC | PRN
Start: 2020-10-10 — End: 2020-10-10

## 2020-10-10 MED ORDER — ONDANSETRON HCL 4 MG/2ML IJ SOLN: 4.0000 mg | Freq: Four times a day (QID) | INTRAMUSCULAR | Status: DC | PRN

## 2020-10-10 MED ORDER — EPHEDRINE 5 MG/ML INJ
INTRAVENOUS | Status: AC
  Filled 2020-10-10: qty 10

## 2020-10-10 MED ORDER — ACETAMINOPHEN 325 MG PO TABS: 650.0000 mg | ORAL_TABLET | ORAL | Status: DC | PRN

## 2020-10-10 MED ORDER — ETOMIDATE 2 MG/ML IV SOLN
INTRAVENOUS | Status: DC | PRN
  Administered 2020-10-10: 16 mg via INTRAVENOUS

## 2020-10-10 MED ORDER — OXYCODONE HCL 5 MG/5ML PO SOLN: 5.0000 mg | Freq: Once | ORAL | Status: DC | PRN

## 2020-10-10 MED ORDER — ONDANSETRON HCL 4 MG/2ML IJ SOLN
INTRAMUSCULAR | Status: DC | PRN
  Administered 2020-10-10: 4 mg via INTRAVENOUS

## 2020-10-10 MED ORDER — DEXAMETHASONE SODIUM PHOSPHATE 10 MG/ML IJ SOLN
INTRAMUSCULAR | Status: DC | PRN
  Administered 2020-10-10: 5 mg via INTRAVENOUS

## 2020-10-10 MED ORDER — CARVEDILOL 3.125 MG PO TABS
3.1250 mg | ORAL_TABLET | Freq: Two times a day (BID) | ORAL | Status: DC
  Administered 2020-10-10 – 2020-10-13 (×7): 3.125 mg via ORAL
  Filled 2020-10-10 (×7): qty 1

## 2020-10-10 MED ORDER — HYDROCODONE-ACETAMINOPHEN 5-325 MG PO TABS: 1.0000 | ORAL_TABLET | ORAL | Status: DC | PRN

## 2020-10-10 MED ORDER — OXYCODONE HCL 5 MG PO TABS: 5.0000 mg | ORAL_TABLET | Freq: Once | ORAL | Status: DC | PRN

## 2020-10-10 MED ORDER — LOSARTAN POTASSIUM 25 MG PO TABS
12.5000 mg | ORAL_TABLET | Freq: Every day | ORAL | Status: DC
  Administered 2020-10-11 – 2020-10-13 (×3): 12.5 mg via ORAL
  Filled 2020-10-10 (×4): qty 0.5

## 2020-10-10 MED ORDER — SUGAMMADEX SODIUM 200 MG/2ML IV SOLN
INTRAVENOUS | Status: DC | PRN
  Administered 2020-10-10: 100 mg via INTRAVENOUS
  Administered 2020-10-10: 200 mg via INTRAVENOUS

## 2020-10-10 MED ORDER — MEPERIDINE HCL 25 MG/ML IJ SOLN: 6.2500 mg | INTRAMUSCULAR | Status: DC | PRN

## 2020-10-10 MED ORDER — HYDRALAZINE HCL 25 MG PO TABS: 25.0000 mg | ORAL_TABLET | Freq: Four times a day (QID) | ORAL | Status: DC | PRN

## 2020-10-10 SURGICAL SUPPLY — 49 items
BANDAGE ESMARK 6X9 LF (GAUZE/BANDAGES/DRESSINGS) ×1 IMPLANT
BLADE SAW SAG 73X25 THK (BLADE) ×1
BLADE SAW SGTL 73X25 THK (BLADE) ×1 IMPLANT
BNDG CMPR 9X6 STRL LF SNTH (GAUZE/BANDAGES/DRESSINGS) ×1
BNDG COHESIVE 6X5 TAN STRL LF (GAUZE/BANDAGES/DRESSINGS) ×2 IMPLANT
BNDG ELASTIC 4X5.8 VLCR STR LF (GAUZE/BANDAGES/DRESSINGS) ×2 IMPLANT
BNDG ELASTIC 6X5.8 VLCR STR LF (GAUZE/BANDAGES/DRESSINGS) ×2 IMPLANT
BNDG ESMARK 6X9 LF (GAUZE/BANDAGES/DRESSINGS) ×2
BNDG GAUZE ELAST 4 BULKY (GAUZE/BANDAGES/DRESSINGS) ×3 IMPLANT
CANISTER SUCT 3000ML PPV (MISCELLANEOUS) ×2 IMPLANT
CLIP VESOCCLUDE MED 6/CT (CLIP) ×2 IMPLANT
COVER BACK TABLE 60X90IN (DRAPES) ×2 IMPLANT
COVER SURGICAL LIGHT HANDLE (MISCELLANEOUS) ×4 IMPLANT
COVER WAND RF STERILE (DRAPES) ×2 IMPLANT
DRAIN CHANNEL 19F RND (DRAIN) IMPLANT
DRAPE HALF SHEET 40X57 (DRAPES) ×2 IMPLANT
DRAPE ORTHO SPLIT 77X108 STRL (DRAPES) ×4
DRAPE SURG ORHT 6 SPLT 77X108 (DRAPES) ×2 IMPLANT
DRAPE U-SHAPE 47X51 STRL (DRAPES) IMPLANT
DRSG ADAPTIC 3X8 NADH LF (GAUZE/BANDAGES/DRESSINGS) ×2 IMPLANT
ELECT CAUTERY BLADE 6.4 (BLADE) ×2 IMPLANT
ELECT REM PT RETURN 9FT ADLT (ELECTROSURGICAL) ×2
ELECTRODE REM PT RTRN 9FT ADLT (ELECTROSURGICAL) ×1 IMPLANT
EVACUATOR SILICONE 100CC (DRAIN) IMPLANT
GAUZE SPONGE 4X4 12PLY STRL (GAUZE/BANDAGES/DRESSINGS) ×3 IMPLANT
GLOVE BIO SURGEON STRL SZ7.5 (GLOVE) ×2 IMPLANT
GLOVE SRG 8 PF TXTR STRL LF DI (GLOVE) ×1 IMPLANT
GLOVE SURG UNDER POLY LF SZ8 (GLOVE) ×2
GOWN STRL REUS W/ TWL XL LVL3 (GOWN DISPOSABLE) ×1 IMPLANT
GOWN STRL REUS W/TWL XL LVL3 (GOWN DISPOSABLE) ×2
KIT BASIN OR (CUSTOM PROCEDURE TRAY) ×2 IMPLANT
KIT TURNOVER KIT B (KITS) ×2 IMPLANT
NS IRRIG 1000ML POUR BTL (IV SOLUTION) ×2 IMPLANT
PACK GENERAL/GYN (CUSTOM PROCEDURE TRAY) ×2 IMPLANT
PAD ARMBOARD 7.5X6 YLW CONV (MISCELLANEOUS) ×4 IMPLANT
STAPLER VISISTAT 35W (STAPLE) ×2 IMPLANT
STOCKINETTE IMPERVIOUS LG (DRAPES) ×2 IMPLANT
SUT ETHILON 3 0 PS 1 (SUTURE) IMPLANT
SUT SILK 0 TIES 10X30 (SUTURE) ×2 IMPLANT
SUT SILK 2 0 (SUTURE) ×2
SUT SILK 2 0 SH CR/8 (SUTURE) ×2 IMPLANT
SUT SILK 2-0 18XBRD TIE 12 (SUTURE) ×1 IMPLANT
SUT VIC AB 2-0 CT1 18 (SUTURE) ×4 IMPLANT
SUT VIC AB 2-0 CT1 27 (SUTURE) ×2
SUT VIC AB 2-0 CT1 TAPERPNT 27 (SUTURE) IMPLANT
SUT VIC AB 3-0 SH 18 (SUTURE) IMPLANT
TOWEL GREEN STERILE (TOWEL DISPOSABLE) ×4 IMPLANT
UNDERPAD 30X36 HEAVY ABSORB (UNDERPADS AND DIAPERS) ×2 IMPLANT
WATER STERILE IRR 1000ML POUR (IV SOLUTION) ×2 IMPLANT

## 2020-10-10 NOTE — Op Note (Signed)
    OPERATIVE NOTE   PROCEDURE: left above-the-knee amputation  PRE-OPERATIVE DIAGNOSIS: left foot tissue loss  POST-OPERATIVE DIAGNOSIS: same as above  SURGEON: [name], MD  ASSISTANT(S): Risa Grill, PA  ANESTHESIA: general  ESTIMATED BLOOD LOSS: <75 mL  FINDING(S): Left above knee amputation with healthy tissue margins.  SPECIMEN(S):  left above-the-knee amputation  INDICATIONS:   [name] [name] is a 75 y.o. male who presents with left foot extensive tissue loss.  The patient is scheduled for a left above-the-knee amputation.  I discussed in depth with the patient the risks, benefits, and alternatives to this procedure.  The patient is aware that the risk of this operation included but are not limited to:  bleeding, infection, myocardial infarction, stroke, death, failure to heal amputation wound, and possible need for more proximal amputation.  The patient is aware of the risks and agrees proceed forward with the procedure.  DESCRIPTION: After full informed written consent was obtained from the patient, the patient was brought back to the operating room, and placed supine upon the operating table.  Prior to induction, the patient received IV antibiotics.  The patient was then prepped and draped in the standard fashion for an above-the-knee amputation.  After obtaining adequate anesthesia, the patient was prepped and draped in the standard fashion for a above-the-knee amputation.  I marked out the anterior and posterior flaps for a fish-mouth type of amputation.  I made the incisions for these flaps, and then dissected through the subcutaneous tissue, fascia, and muscles circumferentially.  I elevated  the periosteal tissue 4 cm more proximal than the anterior skin flap.  I then transected the femur with a power saw at this level.  The femoral vessels were clamped between [place] clamp.  The proximal vessels were oversewn with 2-0 Vicryl and 2-0 Silk.  At this point, the  specimen was passed off the field as the above-the-knee amputation.  At this point, I clamped all visibly bleeding arteries and veins using a combination of suture ligation with Vicryl suture and electrocautery.  The stump was washed off with sterile normal saline and no further active bleeding was noted.  I reapproximated the anterior and posterior fascia  with interrupted stitches of 2-0 Vicryl.  This was completed along the entire length of anterior and posterior fascia until there were no more loose space in the fascial line.  The skin was then  reapproximated with staples.  The stump was washed off and dried.  The incision was dressed with [employer] and  then fluffs were applied.  Kerlix was wrapped around the leg and then gently an ACE wrap was applied.     COMPLICATIONS: None  CONDITION: Stable   [name], MD [facility] Office: [place]   10/10/2020, 3:40 PM
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
## 2020-10-10 NOTE — Anesthesia Postprocedure Evaluation (Signed)
Anesthesia Post Note  Patient: [name] [name]  Procedure(s) Performed: LEFT ABOVE KNEE AMPUTATION (Left: Leg Upper)     Patient location during evaluation: PACU Anesthesia Type: General Level of consciousness: awake and alert, patient cooperative and oriented Pain management: pain level controlled Vital Signs Assessment: post-procedure vital signs reviewed and stable Respiratory status: spontaneous breathing, nonlabored ventilation, respiratory function stable and patient connected to nasal cannula oxygen Cardiovascular status: blood pressure returned to baseline and stable Postop Assessment: no apparent nausea or vomiting Anesthetic complications: no   No notable events documented.  Last Vitals:  Vitals:   10/10/20 1610 10/10/20 1625  BP: 118/70 129/76  Pulse: 70 68  Resp: 17 (!) 25  Temp:    SpO2: 100% 100%    Last Pain:  Vitals:   10/10/20 1625  TempSrc:   PainSc: 0-No pain                 Khloei Spiker,E. Quadre [place]

## 2020-10-10 NOTE — Anesthesia Preprocedure Evaluation (Addendum)
Anesthesia Evaluation  Patient identified by MRN, date of birth, ID band Patient awake    Reviewed: Allergy & Precautions, NPO status , Patient's Chart, lab work & pertinent test results, reviewed documented beta blocker date and time   History of Anesthesia Complications Negative for: history of anesthetic complications  Airway Mallampati: II  TM Distance: >3 FB Neck ROM: Full    Dental  (+) Edentulous Upper, Poor Dentition, Missing, Dental Advisory Given, Chipped   Pulmonary sleep apnea (does not use CPAP) ,  10/06/2020 SARS coronavirus NEG   + rhonchi        Cardiovascular hypertension, Pt. on medications and Pt. on home beta blockers + CAD (medically managed), + Cardiac Stents, + Peripheral Vascular Disease and +CHF  + dysrhythmias Atrial Fibrillation + Cardiac Defibrillator  Rhythm:Regular Rate:Normal  05/2020 ECHO; EF 20-25%, global hypokinesis, Grade 1 DD, mild AS with peak grad 12 mmHg, mean grad 7 mmHg   Neuro/Psych Depression CVA, No Residual Symptoms    GI/Hepatic negative GI ROS, Neg liver ROS,   Endo/Other  diabetes (glu 132), Insulin Dependent  Renal/GU Renal InsufficiencyRenal disease     Musculoskeletal   Abdominal   Peds  Hematology plavix   Anesthesia Other Findings   Reproductive/Obstetrics                            Anesthesia Physical Anesthesia Plan  ASA: 4  Anesthesia Plan: General   Post-op Pain Management:    Induction: Intravenous  PONV Risk Score and Plan: 2 and Ondansetron and Dexamethasone  Airway Management Planned: Oral ETT  Additional Equipment: None  Intra-op Plan:   Post-operative Plan: Extubation in OR  Informed Consent: I have reviewed the patients History and Physical, chart, labs and discussed the procedure including the risks, benefits and alternatives for the proposed anesthesia with the patient or authorized representative who has  indicated his/her understanding and acceptance.     Dental advisory given  Plan Discussed with: CRNA and Surgeon  Anesthesia Plan Comments:        Anesthesia Quick Evaluation

## 2020-10-10 NOTE — Progress Notes (Signed)
Dr. Carlis Abbott notified of pt's Hgb of 10.9 and notified that pt took his 81 mg ASA this morning. No new orders at this time.

## 2020-10-10 NOTE — Anesthesia Procedure Notes (Addendum)
Procedure Name: Intubation Date/Time: 10/10/2020 2:38 PM Performed by: Janene Harvey, CRNA Pre-anesthesia Checklist: Patient identified, Emergency Drugs available, Suction available and Patient being monitored Patient Re-evaluated:Patient Re-evaluated prior to induction Oxygen Delivery Method: Circle system utilized Preoxygenation: Pre-oxygenation with 100% oxygen Induction Type: IV induction Ventilation: Two handed mask ventilation required and Oral airway inserted - appropriate to patient size Laryngoscope Size: Mac and 4 Grade View: Grade II Tube type: Oral Tube size: 7.5 mm Number of attempts: 1 Airway Equipment and Method: Stylet and Oral airway Placement Confirmation: ETT inserted through vocal cords under direct vision, positive ETCO2 and breath sounds checked- equal and bilateral Secured at: 22 cm Tube secured with: Tape Dental Injury: Teeth and Oropharynx as per pre-operative assessment  Comments: Inserted by Rexford Maus, SRNA

## 2020-10-10 NOTE — Transfer of Care (Signed)
Immediate Anesthesia Transfer of Care Note  Patient: KIVON MARCELLUS  Procedure(s) Performed: LEFT ABOVE KNEE AMPUTATION (Left: Leg Upper)  Patient Location: PACU  Anesthesia Type:General  Level of Consciousness: awake  Airway & Oxygen Therapy: Patient Spontanous Breathing and Patient connected to face mask oxygen  Post-op Assessment: Report given to RN and Post -op Vital signs reviewed and stable  Post vital signs: Reviewed and stable  Last Vitals:  Vitals Value Taken Time  BP 116/77 10/10/20 1553  Temp    Pulse 70 10/10/20 1557  Resp 21 10/10/20 1557  SpO2 100 % 10/10/20 1557  Vitals shown include unvalidated device data.  Last Pain:  Vitals:   10/10/20 1142  TempSrc:   PainSc: 0-No pain         Complications: No notable events documented.
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
## 2020-10-10 NOTE — Progress Notes (Signed)
Lab called to say previous PT/INR and PTT were hemolyzed and new sample needs to be drawn and reordered.

## 2020-10-10 NOTE — Progress Notes (Signed)
Patient states that he has no covid symptoms today and that he has been wearing a mask at home.  Dr. Glennon Mac aware and no new orders received.  Dr. Carlis Abbott updated.

## 2020-10-11 ENCOUNTER — Encounter (HOSPITAL_COMMUNITY): Payer: Self-pay | Admitting: Vascular Surgery

## 2020-10-11 ENCOUNTER — Encounter (HOSPITAL_BASED_OUTPATIENT_CLINIC_OR_DEPARTMENT_OTHER): Payer: Medicare HMO | Admitting: Internal Medicine

## 2020-10-11 DIAGNOSIS — L8990 Pressure ulcer of unspecified site, unspecified stage: Secondary | ICD-10-CM | POA: Insufficient documentation

## 2020-10-11 LAB — CBC WITH DIFFERENTIAL/PLATELET
Abs Immature Granulocytes: 0.05 K/uL (ref 0.00–0.07)
Basophils Absolute: 0 K/uL (ref 0.0–0.1)
Basophils Relative: 0 %
Eosinophils Absolute: 0 K/uL (ref 0.0–0.5)
Eosinophils Relative: 0 %
HCT: 31.8 % — ABNORMAL LOW (ref 39.0–52.0)
Hemoglobin: 9.5 g/dL — ABNORMAL LOW (ref 13.0–17.0)
Immature Granulocytes: 1 %
Lymphocytes Relative: 5 %
Lymphs Abs: 0.5 K/uL — ABNORMAL LOW (ref 0.7–4.0)
MCH: 25.6 pg — ABNORMAL LOW (ref 26.0–34.0)
MCHC: 29.9 g/dL — ABNORMAL LOW (ref 30.0–36.0)
MCV: 85.7 fL (ref 80.0–100.0)
Monocytes Absolute: 0.4 K/uL (ref 0.1–1.0)
Monocytes Relative: 4 %
Neutro Abs: 9.5 K/uL — ABNORMAL HIGH (ref 1.7–7.7)
Neutrophils Relative %: 90 %
Platelets: 180 K/uL (ref 150–400)
RBC: 3.71 MIL/uL — ABNORMAL LOW (ref 4.22–5.81)
RDW: 16.4 % — ABNORMAL HIGH (ref 11.5–15.5)
WBC: 10.5 K/uL (ref 4.0–10.5)
nRBC: 0 % (ref 0.0–0.2)

## 2020-10-11 LAB — BASIC METABOLIC PANEL WITH GFR
Anion gap: 9 (ref 5–15)
BUN: 26 mg/dL — ABNORMAL HIGH (ref 8–23)
CO2: 24 mmol/L (ref 22–32)
Calcium: 7.9 mg/dL — ABNORMAL LOW (ref 8.9–10.3)
Chloride: 109 mmol/L (ref 98–111)
Creatinine, Ser: 1.73 mg/dL — ABNORMAL HIGH (ref 0.61–1.24)
GFR, Estimated: 41 mL/min — ABNORMAL LOW
Glucose, Bld: 210 mg/dL — ABNORMAL HIGH (ref 70–99)
Potassium: 4.9 mmol/L (ref 3.5–5.1)
Sodium: 142 mmol/L (ref 135–145)

## 2020-10-11 LAB — GLUCOSE, CAPILLARY
Glucose-Capillary: 188 mg/dL — ABNORMAL HIGH (ref 70–99)
Glucose-Capillary: 190 mg/dL — ABNORMAL HIGH (ref 70–99)
Glucose-Capillary: 130 mg/dL — ABNORMAL HIGH (ref 70–99)

## 2020-10-11 LAB — HEMOGLOBIN A1C
Hgb A1c MFr Bld: 6 % — ABNORMAL HIGH (ref 4.8–5.6)
Mean Plasma Glucose: 126 mg/dL

## 2020-10-11 MED ORDER — CHLORHEXIDINE GLUCONATE CLOTH 2 % EX PADS
6.0000 | MEDICATED_PAD | Freq: Every day | CUTANEOUS | Status: DC
  Administered 2020-10-11 – 2020-10-13 (×3): 6 via TOPICAL

## 2020-10-11 MED ORDER — SODIUM CHLORIDE 0.9 % IV SOLN
1.0000 g | INTRAVENOUS | Status: DC
  Administered 2020-10-11 – 2020-10-12 (×2): 1 g via INTRAVENOUS
  Filled 2020-10-11 (×2): qty 10

## 2020-10-11 NOTE — Progress Notes (Addendum)
PROGRESS NOTE    [name] [name]  GEX:[id number] DOB: 03/24/1946 DOA: 10/10/2020 PCP: [place], [name] G, MD    Brief Narrative: This 75 years old male with PMH significant for CVA with right-sided paresis and wheelchair-bound, chronic left leg ulcer, chronic systolic and diastolic CHF(LVEF 15 to 20%) s/p AICD, multivessel CAD, NSVT, CKD stage IV, chronic indwelling Foley catheter, insulin-dependent diabetes mellitus, hypertension, BPH was admitted in the hospital for elective left-sided AKA. Patient has developed multiple nonhealing ulcers this year in the left heel , calf, dorsal foot.  He has regularly been following up with wound care, infectious disease and vascular surgery.  After serious discussion with infectious disease and vascular surgeon concern raised about recurrent infections.  Patient has finally agreed to undergo left AKA. Vascular surgery is following.  Assessment & Plan:   Active Problems:   Non-healing wound of lower extremity   Non-healing wound of left lower extremity   Pressure injury of skin  Left leg nonhealing wounds status post AKA Patient underwent elective left AKA, tolerated well. Adequate pain control. Continue management as per vascular surgery.   History of chronic systolic CHF -Euvolemic, BP stable, resumed torsemide.   HTN -Blood pressure fairly controlled, continue home medications. -PRN hydralazine.   CKD stage III -Creatinine level stable, euvolemic, resumed torsemide.   IDDM -Cut down Lantus from 35 to 15 unit for tonight, start sliding scale.   Indwelling Foley catheter. -will continue.  UTI: UA consistent with UTI Ceftriaxone for 3 days.   Hx of NSVT Continue amiodarone.    DVT prophylaxis: Lovenox Code Status: Full code Family Communication: Daughter was at bedside Disposition Plan:   Status is: Inpatient  Remains inpatient appropriate because:Inpatient level of care appropriate due to severity of illness  Dispo: The  patient is from: Home              Anticipated d/c is to: SNF              Patient currently is not medically stable to d/c.   Difficult to place patient No  Consultants:  Vascular surgery  Procedures: Left AKA Antimicrobials:   Anti-infectives (From admission, onward)    Start     Dose/Rate Route Frequency Ordered Stop   10/11/20 1445  cefTRIAXone (ROCEPHIN) 1 g in sodium chloride 0.9 % 100 mL IVPB        1 g 200 mL/hr over 30 Minutes Intravenous Every 24 hours 10/11/20 1356     10/10/20 1209  ceFAZolin (ANCEF) 2-4 GM/100ML-% IVPB       Note to Pharmacy: Leonie Man   : cabinet override      10/10/20 1209 10/10/20 1450   10/10/20 1117  ceFAZolin (ANCEF) IVPB 2g/100 mL premix        2 g 200 mL/hr over 30 Minutes Intravenous 30 min pre-op 10/10/20 1117 10/10/20 1449        Subjective: Patient was seen and examined at bedside.  Overnight events noted.   Patient reports feeling improved.  Patient underwent left AKA, tolerated well.  reports pain is better controlled.  Objective: Vitals:   10/11/20 0019 10/11/20 0343 10/11/20 0816 10/11/20 1134  BP: 122/75 118/69 115/69 119/77  Pulse: 87 86 81 80  Resp: 14 16 20 18   Temp: 97.6 F (36.4 C) 99.1 F (37.3 C) 99.3 F (37.4 C) 97.6 F (36.4 C)  TempSrc: Oral Oral Oral Oral  SpO2: 93% 93% 92% 92%  Weight:      Height:  Intake/Output Summary (Last 24 hours) at 10/11/2020 1410 Last data filed at 10/10/2020 2100 Gross per 24 hour  Intake 450 ml  Output 825 ml  Net -375 ml   Filed Weights   10/10/20 1119  Weight: 89 kg    Examination:  General exam: Appears calm and comfortable , not in any acute distress. Respiratory system: Clear to auscultation. Respiratory effort normal. Cardiovascular system: S1 & S2 heard, RRR. No JVD, murmurs, rubs, gallops or clicks. No pedal edema. Gastrointestinal system: Abdomen is nondistended, soft and nontender. No organomegaly or masses felt. Normal bowel sounds heard. Central  nervous system: Alert and oriented. No focal neurological deficits. Extremities: Left above-knee amputation, stump covered in a dressing. Skin: No rashes, lesions or ulcers Psychiatry: Judgement and insight appear normal. Mood & affect appropriate.     Data Reviewed: I have personally reviewed following labs and imaging studies  CBC: Recent Labs  Lab 10/10/20 1250 10/11/20 0341  WBC 9.3 10.5  NEUTROABS  --  9.5*  HGB 10.9* 9.5*  HCT 37.2* 31.8*  MCV 87.3 85.7  PLT 209 180   Basic Metabolic Panel: Recent Labs  Lab 10/10/20 1420 10/11/20 0341  NA 143 142  K 4.1 4.9  CL 109 109  CO2 29 24  GLUCOSE 124* 210*  BUN 24* 26*  CREATININE 1.78* 1.73*  CALCIUM 7.9* 7.9*   GFR: Estimated Creatinine Clearance: 43.6 mL/min (A) (by C-G formula based on SCr of 1.73 mg/dL (H)). Liver Function Tests: Recent Labs  Lab 10/10/20 1420  AST 20  ALT 11  ALKPHOS 106  BILITOT 0.7  PROT 6.8  ALBUMIN 2.0*   No results for input(s): LIPASE, AMYLASE in the last 168 hours. No results for input(s): AMMONIA in the last 168 hours. Coagulation Profile: Recent Labs  Lab 10/10/20 1337  INR 1.4*   Cardiac Enzymes: No results for input(s): CKTOTAL, CKMB, CKMBINDEX, TROPONINI in the last 168 hours. BNP (last 3 results) No results for input(s): PROBNP in the last 8760 hours. HbA1C: Recent Labs    10/10/20 1835  HGBA1C 6.0*   CBG: Recent Labs  Lab 10/10/20 1446 10/10/20 1557 10/10/20 1850 10/10/20 2117 10/11/20 1133  GLUCAP 113* 135* 148* 186* 188*   Lipid Profile: No results for input(s): CHOL, HDL, LDLCALC, TRIG, CHOLHDL, LDLDIRECT in the last 72 hours. Thyroid Function Tests: No results for input(s): TSH, T4TOTAL, FREET4, T3FREE, THYROIDAB in the last 72 hours. Anemia Panel: No results for input(s): VITAMINB12, FOLATE, FERRITIN, TIBC, IRON, RETICCTPCT in the last 72 hours. Sepsis Labs: No results for input(s): PROCALCITON, LATICACIDVEN in the last 168 hours.  Recent  Results (from the past 240 hour(s))  SARS CORONAVIRUS 2 (TAT 6-24 HRS) Nasopharyngeal Nasopharyngeal Swab     Status: None   Collection Time: 10/06/20 11:12 AM   Specimen: Nasopharyngeal Swab  Result Value Ref Range Status   SARS Coronavirus 2 NEGATIVE NEGATIVE Final    Comment: (NOTE) SARS-CoV-2 target nucleic acids are NOT DETECTED.  The SARS-CoV-2 RNA is generally detectable in upper and lower respiratory specimens during the acute phase of infection. Negative results do not preclude SARS-CoV-2 infection, do not rule out co-infections with other pathogens, and should not be used as the sole basis for treatment or other patient management decisions. Negative results must be combined with clinical observations, patient history, and epidemiological information. The expected result is Negative.  Fact Sheet for Patients: HairSlick.no  Fact Sheet for Healthcare Providers: quierodirigir.com  This test is not yet approved or cleared by the  [place] [employer] and  has been authorized for detection and/or diagnosis of SARS-CoV-2 by FDA under an TEFL teacher (EUA). This EUA will remain  in effect (meaning this test can be used) for the duration of the COVID-19 declaration under Se ction 564(b)(1) of the Act, 21 U.S.C. section 360bbb-3(b)(1), unless the authorization is terminated or revoked sooner.  Performed at [facility], [address] [place], [place] [address]     Radiology Studies: DG Chest 1 View  Result Date: 10/10/2020 CLINICAL DATA:  Status post left leg amputation, initial encounter EXAM: CHEST  1 VIEW COMPARISON:  05/26/2020 FINDINGS: Cardiac shadow is enlarged but stable. Defibrillator is again noted. Mild irregularity in the midportion of the lead is noted. Clinical correlation is recommended. Lungs are well aerated bilaterally. Mild right basilar atelectasis is seen. No bony abnormality is noted.  IMPRESSION: Mild right basilar atelectasis. Mild irregularity of the defibrillator lead of uncertain significance. Clinical correlation is recommended. Electronically Signed   By: Alcide Clever M.D.   On: 10/10/2020 17:57     Scheduled Meds:  amiodarone  200 mg Oral Daily   aspirin EC  81 mg Oral Daily   carvedilol  3.125 mg Oral BID WC   Chlorhexidine Gluconate Cloth  6 each Topical Daily   enoxaparin (LOVENOX) injection  30 mg Subcutaneous Q24H   finasteride  5 mg Oral Daily   insulin aspart  0-9 Units Subcutaneous TID WC   insulin glargine  15 Units Subcutaneous QHS   losartan  12.5 mg Oral Daily   magnesium gluconate  500 mg Oral QODAY   mirtazapine  15 mg Oral QHS   multivitamin with minerals  1 tablet Oral Daily   rosuvastatin  40 mg Oral Daily   sodium chloride flush  3 mL Intravenous Q12H   tamsulosin  0.4 mg Oral Daily   torsemide  20 mg Oral QODAY   Continuous Infusions:  sodium chloride     cefTRIAXone (ROCEPHIN)  IV       LOS: 1 day    Time spent: 35 mins    Durell Lofaso, MD [facility]   If 7PM-7AM, please contact night-coverage

## 2020-10-11 NOTE — Consult Note (Signed)
WOC Nurse Consult Note: Reason for Consult: chronic non healing right foot wounds  Recent LLE AKA POD 1 Wound type: arterial  Pressure Injury POA: NA Measurement: see nursing flow sheet  Wound bed: per [facility] notes, clean, and pink  Dressing procedure/placement/frequency:  Remove RLE dressings.  Clean leg and wound with saline, pat dry.  Cut to fit silver hydrofiber (Aquacel Ag+), place over open wounds, top with dry 4x4 gauze.  Wrap from toes to knees with kerlix and 4" Coban  Change 2x week on TUESDAYs and FRIDAYs  Discussed POC with patient and bedside nurse.  Re consult if needed, will not follow at this time. Thanks  Jaren Vanetten [employer], RN,CWOCN, CNS, [place] [phone])

## 2020-10-11 NOTE — Progress Notes (Addendum)
Progress Note    10/11/2020 7:24 AM 1 Day Post-Op  Subjective:  Denies pain.   Vitals:   10/11/20 0019 10/11/20 0343  BP: 122/75 118/69  Pulse: 87 86  Resp: 14 16  Temp: 97.6 F (36.4 C) 99.1 F (37.3 C)  SpO2: 93% 93%    Physical Exam:  General appearance: Awake, alert in no apparent distress Cardiac: Heart rate and rhythm are regular Respirations: Nonlabored Extremities: Left lower extremity surgical dressing intact with small amount of strike-through  CBC    Component Value Date/Time   WBC 10.5 10/11/2020 0341   RBC 3.71 (L) 10/11/2020 0341   HGB 9.5 (L) 10/11/2020 0341   HCT 31.8 (L) 10/11/2020 0341   PLT 180 10/11/2020 0341   MCV 85.7 10/11/2020 0341   MCH 25.6 (L) 10/11/2020 0341   MCHC 29.9 (L) 10/11/2020 0341   RDW 16.4 (H) 10/11/2020 0341   LYMPHSABS 0.5 (L) 10/11/2020 0341   MONOABS 0.4 10/11/2020 0341   EOSABS 0.0 10/11/2020 0341   BASOSABS 0.0 10/11/2020 0341    BMET    Component Value Date/Time   NA 142 10/11/2020 0341   NA 140 11/05/2015 0000   K 4.9 10/11/2020 0341   CL 109 10/11/2020 0341   CO2 24 10/11/2020 0341   GLUCOSE 210 (H) 10/11/2020 0341   BUN 26 (H) 10/11/2020 0341   BUN 20 11/05/2015 0000   CREATININE 1.73 (H) 10/11/2020 0341   CREATININE 1.39 (H) 07/08/2015 1502   CALCIUM 7.9 (L) 10/11/2020 0341   GFRNONAA 41 (L) 10/11/2020 0341   GFRAA 36 (L) 06/15/2019 2212     Intake/Output Summary (Last 24 hours) at 10/11/2020 0724 Last data filed at 10/10/2020 2100 Gross per 24 hour  Intake 450 ml  Output 825 ml  Net -375 ml    HOSPITAL MEDICATIONS Scheduled Meds:  amiodarone  200 mg Oral Daily   aspirin EC  81 mg Oral Daily   carvedilol  3.125 mg Oral BID WC   enoxaparin (LOVENOX) injection  30 mg Subcutaneous Q24H   finasteride  5 mg Oral Daily   insulin aspart  0-9 Units Subcutaneous TID WC   insulin glargine  15 Units Subcutaneous QHS   losartan  12.5 mg Oral Daily   magnesium gluconate  500 mg Oral QODAY    mirtazapine  15 mg Oral QHS   multivitamin with minerals  1 tablet Oral Daily   rosuvastatin  40 mg Oral Daily   sodium chloride flush  3 mL Intravenous Q12H   tamsulosin  0.4 mg Oral Daily   torsemide  20 mg Oral QODAY   Continuous Infusions:  sodium chloride     PRN Meds:.sodium chloride, acetaminophen, hydrALAZINE, HYDROcodone-acetaminophen, ondansetron (ZOFRAN) IV, sodium chloride flush  Assessment and Plan: POD 1 left AKA. VSS. Afebrile. Hgb drift>1.5 g/dL PT/OT eval Remove dressing tomorrow.  Nursing staff has requested chronic Foley order and wound care consultation order for LLE.  Tele monitori per medicine service.  -DVT prophylaxis:  Lovenox   Risa Grill, PA-C [facility] [phone] 10/11/2020  7:24 AM   I have seen and evaluated the patient. I agree with the PA note as documented above.  Postop day 1 status post left above-knee amputation.  Dressing has little bit of staining but otherwise we will plan to change the dressing tomorrow on postop day 2.  Pain has been well controlled overnight and has required no narcotics.  We have placed an order for wound care for dressing changes  to his right lower extremity and he was in wound clinic prior to admission.  Has chronic indwelling Foley.  Suspect therapy will recommend SNF and I believe family wants to take him home.   [name], MD [facility] [place] Office: [phone]

## 2020-10-12 DIAGNOSIS — L89151 Pressure ulcer of sacral region, stage 1: Secondary | ICD-10-CM

## 2020-10-12 DIAGNOSIS — R8281 Pyuria: Secondary | ICD-10-CM

## 2020-10-12 DIAGNOSIS — M8660 Other chronic osteomyelitis, unspecified site: Secondary | ICD-10-CM

## 2020-10-12 DIAGNOSIS — Z978 Presence of other specified devices: Secondary | ICD-10-CM

## 2020-10-12 DIAGNOSIS — N1832 Chronic kidney disease, stage 3b: Secondary | ICD-10-CM

## 2020-10-12 DIAGNOSIS — E1142 Type 2 diabetes mellitus with diabetic polyneuropathy: Secondary | ICD-10-CM

## 2020-10-12 LAB — GLUCOSE, CAPILLARY
Glucose-Capillary: 73 mg/dL (ref 70–99)
Glucose-Capillary: 117 mg/dL — ABNORMAL HIGH (ref 70–99)
Glucose-Capillary: 124 mg/dL — ABNORMAL HIGH (ref 70–99)

## 2020-10-12 LAB — CBC
HCT: 32 % — ABNORMAL LOW (ref 39.0–52.0)
Hemoglobin: 9.7 g/dL — ABNORMAL LOW (ref 13.0–17.0)
MCH: 25.9 pg — ABNORMAL LOW (ref 26.0–34.0)
MCHC: 30.3 g/dL (ref 30.0–36.0)
MCV: 85.6 fL (ref 80.0–100.0)
Platelets: 198 10*3/uL (ref 150–400)
RBC: 3.74 MIL/uL — ABNORMAL LOW (ref 4.22–5.81)
RDW: 16.2 % — ABNORMAL HIGH (ref 11.5–15.5)
WBC: 9.7 10*3/uL (ref 4.0–10.5)
nRBC: 0 % (ref 0.0–0.2)

## 2020-10-12 LAB — COMPREHENSIVE METABOLIC PANEL
ALT: 7 U/L (ref 0–44)
AST: 19 U/L (ref 15–41)
Albumin: 1.8 g/dL — ABNORMAL LOW (ref 3.5–5.0)
Alkaline Phosphatase: 87 U/L (ref 38–126)
Anion gap: 7 (ref 5–15)
BUN: 24 mg/dL — ABNORMAL HIGH (ref 8–23)
CO2: 28 mmol/L (ref 22–32)
Calcium: 7.8 mg/dL — ABNORMAL LOW (ref 8.9–10.3)
Chloride: 106 mmol/L (ref 98–111)
Creatinine, Ser: 1.75 mg/dL — ABNORMAL HIGH (ref 0.61–1.24)
GFR, Estimated: 40 mL/min — ABNORMAL LOW (ref >60)
Glucose, Bld: 81 mg/dL (ref 70–99)
Potassium: 4.1 mmol/L (ref 3.5–5.1)
Sodium: 141 mmol/L (ref 135–145)
Total Bilirubin: 0.7 mg/dL (ref 0.3–1.2)
Total Protein: 5.7 g/dL — ABNORMAL LOW (ref 6.5–8.1)

## 2020-10-12 LAB — PHOSPHORUS: Phosphorus: 2.4 mg/dL — ABNORMAL LOW (ref 2.5–4.6)

## 2020-10-12 LAB — SURGICAL PATHOLOGY

## 2020-10-12 LAB — MRSA NEXT GEN BY PCR, NASAL: MRSA by PCR Next Gen: DETECTED — AB

## 2020-10-12 LAB — MAGNESIUM: Magnesium: 1.8 mg/dL (ref 1.7–2.4)

## 2020-10-12 MED ORDER — INSULIN GLARGINE 100 UNIT/ML ~~LOC~~ SOLN
10.0000 [IU] | Freq: Every day | SUBCUTANEOUS | Status: DC
  Administered 2020-10-12 – 2020-10-13 (×2): 10 [IU] via SUBCUTANEOUS
  Filled 2020-10-12 (×2): qty 0.1

## 2020-10-12 MED ORDER — INSULIN ASPART 100 UNIT/ML IJ SOLN
3.0000 [IU] | Freq: Three times a day (TID) | INTRAMUSCULAR | Status: DC
  Administered 2020-10-12 – 2020-10-13 (×3): 3 [IU] via SUBCUTANEOUS

## 2020-10-12 NOTE — Plan of Care (Signed)
  Problem: Pain Managment: Goal: General experience of comfort will improve Outcome: Progressing   Problem: Skin Integrity: Goal: Risk for impaired skin integrity will decrease Outcome: Progressing   

## 2020-10-12 NOTE — Evaluation (Signed)
Physical Therapy Evaluation Patient Details Name: [name] [name] MRN: [id number] DOB: 1946/01/14 Today's Date: 10/12/2020   History of Present Illness  Pt is a 75 y/o male s/p L AKA on 6/20. PMH includes CVA with R sided weakness, DM, MI, HTN, CAD, CKD, and CHF.  Clinical Impression  Pt admitted secondary to problem above with deficits below. Required mod A +2 for bed mobility tasks and min A to perform lateral scoot using BUE along EOB. Attempted to perform squat pivot to chair, but only able to achieve minimal clearance of hips with max A +2. Will need drop arm recliner to perform further transfers; RN notified. Discussed SNF with pt and pt's daughter, however, pt's daughter currently refusing SNF. Will need max HH services at d/c and would benefit from use of hoyer lift for transfers. Will continue to follow acutely to maximize functional mobility independence and safety.     Follow Up Recommendations Home health PT;Supervision/Assistance - 24 hour (HHOT/HHRN/HHaide; pt's daughter refusing SNF)    Equipment Recommendations  Other (comment) (hoyer lift with pad)    Recommendations for Other Services       Precautions / Restrictions Precautions Precautions: Fall;Other (comment) Precaution Comments: L AKA Restrictions Weight Bearing Restrictions: Yes LLE Weight Bearing: Non weight bearing      Mobility  Bed Mobility Overal bed mobility: Needs Assistance Bed Mobility: Supine to Sit;Sit to Supine     Supine to sit: Mod assist;+2 for physical assistance Sit to supine: Mod assist;+2 for physical assistance   General bed mobility comments: Required assist for trunk and RLE. Increased time required.    Transfers Overall transfer level: Needs assistance Equipment used: 2 person hand held assist Transfers: Sit to/from Stand;Lateral/Scoot Transfers Sit to Stand: Max assist;+2 physical assistance        Lateral/Scoot Transfers: Min assist General transfer comment: Attempted  to stand, but only achieving minimal clearance of bottom with max A +2. Was able to scoot along EOB with min A.  Ambulation/Gait                Stairs            Wheelchair Mobility    Modified Rankin (Stroke Patients Only)       Balance Overall balance assessment: Needs assistance Sitting-balance support: Feet supported;Bilateral upper extremity supported Sitting balance-Leahy Scale: Fair                                       Pertinent Vitals/Pain Pain Assessment: Faces Faces Pain Scale: Hurts a little bit Pain Location: L residual limb Pain Descriptors / Indicators: Aching;Operative site guarding Pain Intervention(s): Limited activity within patient's tolerance;Monitored during session;Repositioned    Home Living Family/patient expects to be discharged to:: Private residence Living Arrangements: Children;Spouse/significant other Available Help at Discharge: Family;Available 24 hours/day Type of Home: House Home Access: Ramped entrance     Home Layout: One level Home Equipment: Bedside commode;Wheelchair - manual      Prior Function Level of Independence: Needs assistance   Gait / Transfers Assistance Needed: Requires significant assist for transfers to/from [facility].  ADL's / Homemaking Assistance Needed: Pt reports independence with bathing. Needs assist with dressing        Hand Dominance        Extremity/Trunk Assessment   Upper Extremity Assessment Upper Extremity Assessment: Defer to OT evaluation    Lower Extremity Assessment Lower Extremity Assessment: RLE  deficits/detail;LLE deficits/detail RLE Deficits / Details: R weakness at baseline. Unable to extend knee fully secondary to contracture. LLE Deficits / Details: s/p L AKA.    Cervical / Trunk Assessment Cervical / Trunk Assessment: Kyphotic;Other exceptions Cervical / Trunk Exceptions: R lateral lean  Communication   Communication: HOH  Cognition Arousal/Alertness:  Awake/alert Behavior During Therapy: WFL for tasks assessed/performed Overall Cognitive Status: History of cognitive impairments - at baseline                                        General Comments General comments (skin integrity, edema, etc.): Pt's daughter present. Educated about SNF, but daughter refusing and wanting to take pt home.    Exercises     Assessment/Plan    PT Assessment Patient needs continued PT services  PT Problem List Decreased strength;Decreased balance;Decreased mobility;Decreased cognition;Decreased knowledge of use of DME;Decreased safety awareness;Decreased knowledge of precautions       PT Treatment Interventions Functional mobility training;DME instruction;Therapeutic exercise;Therapeutic activities;Balance training;Patient/family education;Wheelchair mobility training    PT Goals (Current goals can be found in the Care Plan section)  Acute Rehab PT Goals Patient Stated Goal: for pt to return home PT Goal Formulation: With patient/family Time For Goal Achievement: 10/26/20 Potential to Achieve Goals: Fair    Frequency Min 3X/week   Barriers to discharge        Co-evaluation               AM-PAC PT "6 Clicks" Mobility  Outcome Measure Help needed turning from your back to your side while in a flat bed without using bedrails?: Total Help needed moving from lying on your back to sitting on the side of a flat bed without using bedrails?: Total Help needed moving to and from a bed to a chair (including a wheelchair)?: Total Help needed standing up from a chair using your arms (e.g., wheelchair or bedside chair)?: Total Help needed to walk in hospital room?: Total Help needed climbing 3-5 steps with a railing? : Total 6 Click Score: 6    End of Session Equipment Utilized During Treatment: Gait belt Activity Tolerance: Patient tolerated treatment well Patient left: with call bell/phone within reach;with bed alarm set;in bed  (bed in chair position) Nurse Communication: Mobility status;Other (comment) (pt needs drop arm recliner) PT Visit Diagnosis: Unsteadiness on feet (R26.81);Muscle weakness (generalized) (M62.81);Difficulty in walking, not elsewhere classified (R26.2)    Time: [id number] PT Time Calculation (min) (ACUTE ONLY): 21 min   Charges:   PT Evaluation $PT Eval Moderate Complexity: 1 Mod          Reuel Derby, PT, DPT  Acute Rehabilitation Services  Pager: [phone] Office: [phone]   Rudean Hitt 10/12/2020, 1:41 PM

## 2020-10-12 NOTE — Progress Notes (Signed)
Foley catheter changed per order. Pt had 74fcoude cath in and replaced with same. Noted some blood when pulling out original catheter. Pt tolerated well.

## 2020-10-12 NOTE — Consult Note (Signed)
[place] for Infectious Disease    Date of Admission:  10/10/2020     Reason for Consult: chronic foot osteomyelitis    Referring Provider: Lonny [name]   Lines:  Peripheral iv's  Abx: 6/21-c ceftriaxone 6/20 periop cefazolin       Outpatient linezolid/amox-clav  Assessment: 75 y.o. male cad, ischemic CM s/p defibrillator several years ago, ckd, cva/hemiplegia, neurogenic bladder with chronic foley catheter, lymphedema, chronic left foot OM s/p amputation this admission, id consulted to assist with abx  Clinical hx/objectives so far unremarkable for dissemination of pyogenic process outside of the foot OM which he is now s/p aka on 6/20.   He has pyuria but no sx of uti otherwise, and this is in setting chronic foley  Given his "intermittently feeling cold" we can be thorough and get surveillance bcx, but he doesn't meet any sepsis criteria otherwise   I would stop all antibiotics, wait for bcx result, change out foley  Discussed with [name] [name] [name]  Plan: Please change out foley catheter F/u bcx Stop all abx No need for outpatient id f/u      I spent 60 minute reviewing data/chart, and coordinating care and >50% direct face to face time providing counseling/discussing diagnostics/treatment plan with patient   ------------------------------------------------ Active Problems:   Non-healing wound of lower extremity   Non-healing wound of left lower extremity   Pressure injury of skin    HPI: [name] [name] is a 75 y.o. male cad, ischemic CM s/p defibrillator several years ago, ckd, cva/hemiplegia, neurogenic bladder with chronic foley catheter, lymphedema, chronic left foot OM s/p amputation this admission, id consulted to assist with abx  He is followed by my partner [name] [name] for chronic left foot om. There was a concensus that he needed amputation for definitive cure which he had taken some time to ponder. He remains on outpatient linezolid/amox-clav  per [name] [name] [name]'s last note a month prior to this admission  He was also followed by wound care clinic outpatient who performed pcr on the infected bone which showed e faecalis, proteus/ecoli, mrsa (R tetracycline/quinolone), no esbl, group b Strep, peptostreptococcus, corynebacterium  Patient ultimately agree to amputation and was admitted 6/20 for that  He denies having fever, progressive cellulitis up into the distal leg He intermittently feels "cold" but even has this before the infection Intact appetite   During this admission he has urinalysis checked (from old foley) which showed pyuria and was placed on ceftriaxone. No blood cx or urine cx  He last changed his foley 4-5 weeks prior to this admission  Post op course uncomplicated  Denies back pain, cough, chest pain, headache, sob, n/v/diarrhea/abd pain  Family History  Problem Relation Age of Onset   COPD Mother    Arthritis Brother    Long QT syndrome Daughter     Social History   Tobacco Use   Smoking status: Never   Smokeless tobacco: Never  Vaping Use   Vaping Use: Never used  Substance Use Topics   Alcohol use: No   Drug use: No    Allergies  Allergen Reactions   Tape Other (See Comments)    SKIN IS VERY FRAGILE- WILL TEAR AND BRUISE EASILY!!   Sulfa Antibiotics Rash    Review of Systems: ROS All Other ROS was negative, except mentioned above   Past Medical History:  Diagnosis Date   Automatic implantable cardioverter-defibrillator in situ    CAD (coronary artery disease)  a. LHC (08/2005):  Ostial LAD 99%, mid LAD 50%, superior D1 80%, inferior D1 75%, mid OM proximal 80%, proximal PDA 25-30%, mid RCA 99%.   MRI with full thickness scar.  Med Rx.  2015 demonstrated similar diffuse three-vessel coronary disease. Perfusion imaging with rest we distribution demonstrated no viability in the area of the LAD and circumflex. Medical management.   CAD, multiple vessel, RCA 100% mid occl.; LAD 99%  stenosisprox.  mid of 50-60%; LCX OM-1 90%, OM-2 99%,AV groove 80%  12/10/2013   CHF (congestive heart failure) (HCC)    Chronic combined systolic and diastolic heart failure (HCC)    Chronic osteomyelitis of toe, left ([place]) 09/01/2020   CKD (chronic kidney disease)    [name] [name] PCP monitors   Coronary artery disease    Enterococcus faecalis infection 09/01/2020   Gout    History of kidney stones    passed   Hx of echocardiogram 2015   Echo (08/2013):  EF 10-15%, diff HK, mod LAE, severe RVE, mod reduced RVSF, mild RAE, PASP 41 mmHg.   Hypertension    10/06/20- blood pressures run low at this time.   Ischemic cardiomyopathy    Echo (8/11):  EF 40-45%;  Echo (5/15):  EF 10-15%   MRSA infection 09/01/2020   Myocardial infarction [facility]) 2007   out of hosp MI - LHC with 3v CAD rx medically, 2017   NSVT (nonsustained ventricular tachycardia) ([place])    s/p ICD   Paralysis ([place])    Left Leg- has a Neurogenic bladder- has an indewelling catherter.   Peripheral neuropathy    Polymicrobial bacterial infection 09/01/2020   Sleep apnea    "has CPAP; won't use it"   Stroke [facility]) ~ 2013   "right leg weak since" (12/09/2013)   Type II diabetes mellitus ([place]) dx'd 2005       Scheduled Meds:  amiodarone  200 mg Oral Daily   aspirin EC  81 mg Oral Daily   carvedilol  3.125 mg Oral BID WC   Chlorhexidine Gluconate Cloth  6 each Topical Daily   enoxaparin (LOVENOX) injection  30 mg Subcutaneous Q24H   finasteride  5 mg Oral Daily   insulin aspart  0-9 Units Subcutaneous TID WC   insulin glargine  15 Units Subcutaneous QHS   losartan  12.5 mg Oral Daily   magnesium gluconate  500 mg Oral QODAY   mirtazapine  15 mg Oral QHS   multivitamin with minerals  1 tablet Oral Daily   rosuvastatin  40 mg Oral Daily   sodium chloride flush  3 mL Intravenous Q12H   tamsulosin  0.4 mg Oral Daily   torsemide  20 mg Oral QODAY   Continuous Infusions:  sodium chloride     cefTRIAXone (ROCEPHIN)  IV 1 g  (10/11/20 1637)   PRN Meds:.sodium chloride, acetaminophen, hydrALAZINE, HYDROcodone-acetaminophen, ondansetron (ZOFRAN) IV, sodium chloride flush   OBJECTIVE: Blood pressure 126/81, pulse 80, temperature 98.2 F (36.8 C), temperature source Oral, resp. rate 17, height '6\' 2"'$  (1.88 m), weight 89 kg, SpO2 98 %.  Physical Exam General/constitutional: no distress, pleasant HEENT: Normocephalic, PER, Conj Clear, EOMI, Oropharynx clear Neck supple CV: rrr no mrg; left chest defib site no tenderness/redness Lungs: clear to auscultation, normal respiratory effort Abd: Soft, Nontender Ext: no edema Skin: No Rash Neuro: nonfocal MSK: s/p left aka; bulky dressing c/d/I; right distal LE in coban wrap for lymphedema per patient Psych alert/oriented    Lab Results Lab Results  Component  Value Date   WBC 9.7 10/12/2020   HGB 9.7 (L) 10/12/2020   HCT 32.0 (L) 10/12/2020   MCV 85.6 10/12/2020   PLT 198 10/12/2020    Lab Results  Component Value Date   CREATININE 1.75 (H) 10/12/2020   BUN 24 (H) 10/12/2020   NA 141 10/12/2020   K 4.1 10/12/2020   CL 106 10/12/2020   CO2 28 10/12/2020    Lab Results  Component Value Date   ALT 7 10/12/2020   AST 19 10/12/2020   ALKPHOS 87 10/12/2020   BILITOT 0.7 10/12/2020      Microbiology: Recent Results (from the past 240 hour(s))  SARS CORONAVIRUS 2 (TAT 6-24 HRS) Nasopharyngeal Nasopharyngeal Swab     Status: None   Collection Time: 10/06/20 11:12 AM   Specimen: Nasopharyngeal Swab  Result Value Ref Range Status   SARS Coronavirus 2 NEGATIVE NEGATIVE Final    Comment: (NOTE) SARS-CoV-2 target nucleic acids are NOT DETECTED.  The SARS-CoV-2 RNA is generally detectable in upper and lower respiratory specimens during the acute phase of infection. Negative results do not preclude SARS-CoV-2 infection, do not rule out co-infections with other pathogens, and should not be used as the sole basis for treatment or other patient management  decisions. Negative results must be combined with clinical observations, patient history, and epidemiological information. The expected result is Negative.  Fact Sheet for Patients: SugarRoll.be  Fact Sheet for Healthcare Providers: https://www.woods-mathews.com/  This test is not yet approved or cleared by the [place] FDA and  has been authorized for detection and/or diagnosis of SARS-CoV-2 by FDA under an Emergency Use Authorization (EUA). This EUA will remain  in effect (meaning this test can be used) for the duration of the COVID-19 declaration under Se ction 564(b)(1) of the Act, 21 U.S.C. section 360bbb-3(b)(1), unless the authorization is terminated or revoked sooner.  Performed at [facility], [place] [address], [place],  [address]      Serology:    Imaging: If present, new imagings (plain films, ct scans, and mri) have been personally visualized and interpreted; radiology reports have been reviewed. Decision making incorporated into the  Impression / Recommendations.   6/20 cxr Mild right basilar atelectasis.   Mild irregularity of the defibrillator lead of uncertain significance. Clinical correlation is recommended.  Jabier Mutton, [place] for [facility] [place] [phone] pager    10/12/2020, 2:20 PM

## 2020-10-12 NOTE — Progress Notes (Signed)
PROGRESS NOTE    [name] [name]  [id number] DOB: [birth year] DOA: 10/10/2020 PCP: [name], [name] G, MD   Brief Narrative: [name] [name] is a 75 y.o. male with a history of CVA with resultant right-sided paresis, chronic left leg ulcer, chronic systolic and diastolic heart failure status post AICD, multivessel CAD, nonsustained V. tach, CKD stage IIIb, chronic indwelling Foley catheter, diabetes mellitus, hypertension, BPH.  Patient presented secondary to need for AKA.  Vascular surgery was consulted and performed AKA on 6/20.   Assessment & Plan:   Active Problems:   Bladder outlet obstruction   CKD (chronic kidney disease) stage 3, GFR 30-59 ml/min (HCC)   Chronic combined systolic and diastolic congestive heart failure (HCC)   Diabetic polyneuropathy associated with type 2 diabetes mellitus (HCC)   Non-healing wound of lower extremity   Non-healing wound of left lower extremity   Pressure injury of skin   Left leg nonhealing wounds Chronic osteomyelitis Patient was seen and evaluated by vascular surgery.  Patient underwent successful elective left AKA on 6/20. -Vascular surgery recommendations -Infectious disease consult secondary to patient history of chronic osteomyelitis and recent ID follow-up.  Will need recommendations for bacteremia work-up/antibiotics  Chronic combined systolic and diastolic heart failure CAD  Patient's EF of 20-25% from April 2022.  Patient is status post ICD placement.  Patient follows with advanced heart failure team.  He is currently on torsemide, Coreg and losartan as an outpatient. -Continue torsemide, Coreg and losartan -Daily weights/strict ins and outs  Primary hypertension Patient is on Coreg and losartan as an outpatient as mentioned above. -Continue Coreg and losartan  CKD stage IIIb Stable.  Diabetes mellitus, type II Patient is on Lantus as an outpatient.  Lantus was decreased from 35 units to 15 units while inpatient.   Fasting blood sugar of 73 this [name].  Patient received Lantus 15 units in aspart 6 units in the last 24 hours. -Decrease to Lantus 10 units and continue sliding scale insulin -Add NovoLog 3 units 3 times daily with meals  UTI Diagnosis.  Urinalysis.  No notable symptoms.  No fevers.  Patient started empirically on ceftriaxone.  Complicated by chronic indwelling Foley catheter -Continue ceftriaxone with plans for 7-day treatment course  Bladder outlet obstruction BPH Patient is currently on finasteride, tamsulosin and has a chronic indwelling Foley. -Continue finasteride and tamsulosin  Murmur Unsure if this is old or new.  Patient with a recent echo in April 2022 which was only significant for trivial mitral valve regurgitation.  History of nonsustained V. tach In setting of severe dilated cardiomyopathy.  Patient has an ICD in place.  Patient is also on amiodarone. -Continue amiodarone  Hyperlipidemia Patient is on Crestor 40 mg daily as an outpatient -Continue Crestor  Pressure injury Medial sacrum.  Present on admission.   DVT prophylaxis: Lovenox Code Status:   Code Status: Full Code Family Communication: Daughter at bedside Disposition Plan: Discharge home likely in 24 hours pending infectious disease recommendations in addition to PT/OT recommendations   Consultants:  Vascular surgery Infectious disease  Procedures:  LEFT ABOVE KNEE AMPUTATION (10/10/2020)  Antimicrobials: Ceftriaxone IV   Subjective: Patient without any concerns today.  Objective: Vitals:   10/11/20 1134 10/11/20 2027 10/12/20 0754 10/12/20 1225  BP: 119/77 111/66 115/64 126/81  Pulse: 80 69 80 80  Resp: '18 18 16 17  '$ Temp: 97.6 F (36.4 C) 98.3 F (36.8 C) 99.7 F (37.6 C) 98.2 F (36.8 C)  TempSrc: Oral Oral Oral Oral  SpO2: 92% 94% 96% 98%  Weight:      Height:        Intake/Output Summary (Last 24 hours) at 10/12/2020 1304 Last data filed at 10/12/2020 0954 Gross per 24  hour  Intake 545.43 ml  Output 3700 ml  Net -3154.57 ml   Filed Weights   10/10/20 1119  Weight: 89 kg    Examination:  General exam: Appears calm and comfortable Respiratory system: Clear to auscultation. Respiratory effort normal. Cardiovascular system: S1 & S2 heard, RRR.  2/6 systolic murmur Gastrointestinal system: Abdomen is nondistended, soft and nontender. No organomegaly or masses felt. Normal bowel sounds heard. Central nervous system: Alert and oriented. No focal neurological deficits. Musculoskeletal: No edema. No calf tenderness.  Left AKA.  Left stump wrapped in bandage. Skin: No cyanosis. No rashes Psychiatry: Judgement and insight appear normal. Mood & affect appropriate.     Data Reviewed: I have personally reviewed following labs and imaging studies  CBC Lab Results  Component Value Date   WBC 9.7 10/12/2020   RBC 3.74 (L) 10/12/2020   HGB 9.7 (L) 10/12/2020   HCT 32.0 (L) 10/12/2020   MCV 85.6 10/12/2020   MCH 25.9 (L) 10/12/2020   PLT 198 10/12/2020   MCHC 30.3 10/12/2020   RDW 16.2 (H) 10/12/2020   LYMPHSABS 0.5 (L) 10/11/2020   MONOABS 0.4 10/11/2020   EOSABS 0.0 10/11/2020   BASOSABS 0.0 [id number]     Last metabolic panel Lab Results  Component Value Date   NA 141 10/12/2020   K 4.1 10/12/2020   CL 106 10/12/2020   CO2 28 10/12/2020   BUN 24 (H) 10/12/2020   CREATININE 1.75 (H) 10/12/2020   GLUCOSE 81 10/12/2020   GFRNONAA 40 (L) 10/12/2020   GFRAA 36 (L) 06/15/2019   CALCIUM 7.8 (L) 10/12/2020   PHOS 2.4 (L) 10/12/2020   PROT 5.7 (L) 10/12/2020   ALBUMIN 1.8 (L) 10/12/2020   BILITOT 0.7 10/12/2020   ALKPHOS 87 10/12/2020   AST 19 10/12/2020   ALT 7 10/12/2020   ANIONGAP 7 10/12/2020    CBG (last 3)  Recent Labs    10/11/20 2031 10/12/20 0804 10/12/20 1139  GLUCAP 130* 73 117*     GFR: Estimated Creatinine Clearance: 43.1 mL/min (A) (by C-G formula based on SCr of 1.75 mg/dL (H)).  Coagulation Profile: Recent Labs   Lab 10/10/20 1337  INR 1.4*    Recent Results (from the past 240 hour(s))  SARS CORONAVIRUS 2 (TAT 6-24 HRS) Nasopharyngeal Nasopharyngeal Swab     Status: None   Collection Time: 10/06/20 11:12 AM   Specimen: Nasopharyngeal Swab  Result Value Ref Range Status   SARS Coronavirus 2 NEGATIVE NEGATIVE Final    Comment: (NOTE) SARS-CoV-2 target nucleic acids are NOT DETECTED.  The SARS-CoV-2 RNA is generally detectable in upper and lower respiratory specimens during the acute phase of infection. Negative results do not preclude SARS-CoV-2 infection, do not rule out co-infections with other pathogens, and should not be used as the sole basis for treatment or other patient management decisions. Negative results must be combined with clinical observations, patient history, and epidemiological information. The expected result is Negative.  Fact Sheet for Patients: SugarRoll.be  Fact Sheet for Healthcare Providers: https://www.woods-mathews.com/  This test is not yet approved or cleared by the [place] FDA and  has been authorized for detection and/or diagnosis of SARS-CoV-2 by FDA under an Emergency Use Authorization (EUA). This EUA will remain  in effect (meaning this test  can be used) for the duration of the COVID-19 declaration under Se ction 564(b)(1) of the Act, 21 U.S.C. section 360bbb-3(b)(1), unless the authorization is terminated or revoked sooner.  Performed at [facility], [place] [address], [place], [place] [address]         Radiology Studies: DG Chest 1 View  Result Date: 10/10/2020 CLINICAL DATA:  Status post left leg amputation, initial encounter EXAM: CHEST  1 VIEW COMPARISON:  05/26/2020 FINDINGS: Cardiac shadow is enlarged but stable. Defibrillator is again noted. Mild irregularity in the midportion of the lead is noted. Clinical correlation is recommended. Lungs are well aerated bilaterally. Mild right  basilar atelectasis is seen. No bony abnormality is noted. IMPRESSION: Mild right basilar atelectasis. Mild irregularity of the defibrillator lead of uncertain significance. Clinical correlation is recommended. Electronically Signed   By: Inez Catalina M.D.   On: 10/10/2020 17:57        Scheduled Meds:  amiodarone  200 mg Oral Daily   aspirin EC  81 mg Oral Daily   carvedilol  3.125 mg Oral BID WC   Chlorhexidine Gluconate Cloth  6 each Topical Daily   enoxaparin (LOVENOX) injection  30 mg Subcutaneous Q24H   finasteride  5 mg Oral Daily   insulin aspart  0-9 Units Subcutaneous TID WC   insulin glargine  15 Units Subcutaneous QHS   losartan  12.5 mg Oral Daily   magnesium gluconate  500 mg Oral QODAY   mirtazapine  15 mg Oral QHS   multivitamin with minerals  1 tablet Oral Daily   rosuvastatin  40 mg Oral Daily   sodium chloride flush  3 mL Intravenous Q12H   tamsulosin  0.4 mg Oral Daily   torsemide  20 mg Oral QODAY   Continuous Infusions:  sodium chloride     cefTRIAXone (ROCEPHIN)  IV 1 g (10/11/20 1637)     LOS: 2 days     Cordelia Poche, MD [facility] 10/12/2020, 1:04 PM  If 7PM-7AM, please contact night-coverage [email or web address]

## 2020-10-12 NOTE — Evaluation (Signed)
Occupational Therapy Evaluation Patient Details Name: [name] [name] MRN: [id number] DOB: 1945-11-04 Today's Date: 10/12/2020    History of Present Illness Pt is a 75 y/o male s/p L AKA on 6/20. PMH includes CVA with R sided weakness, DM, MI, HTN, CAD, CKD, and CHF.   Clinical Impression   Pt admitted to [facility] for procedure listed above. PTA pt reported that he required assist with all mobility and most ADL's. Pt most likely close to baseline, with ADL performance, requiring set up for UB ADL's and Mod-Max A for LB ADL's. At this time, transfers are requiring a heavy max-total +2, compared to mod A +1 at baseline. Pt's family is requesting to discharge to home instead of with therapy, they were educated on benefits of therapy, however they still refused. Acute OT will follow up with pt to address concerns listed below.     Follow Up Recommendations  Home health OT;Supervision/Assistance - 24 hour    Equipment Recommendations  Other (comment) (Hoyer Lift and pads for single leg amputee.)    Recommendations for Other Services       Precautions / Restrictions Precautions Precautions: Fall;Other (comment) Precaution Comments: L AKA Restrictions Weight Bearing Restrictions: Yes LLE Weight Bearing: Non weight bearing      Mobility Bed Mobility Overal bed mobility: Needs Assistance Bed Mobility: Supine to Sit;Sit to Supine     Supine to sit: Mod assist;+2 for physical assistance Sit to supine: Mod assist;+2 for physical assistance   General bed mobility comments: Required assist for trunk and RLE. Increased time required.    Transfers Overall transfer level: Needs assistance Equipment used: 2 person hand held assist Transfers: Sit to/from Stand;Lateral/Scoot Transfers Sit to Stand: Max assist;+2 physical assistance        Lateral/Scoot Transfers: Min assist General transfer comment: Attempted to stand, but only achieving minimal clearance of bottom with max A +2. Was able  to scoot along EOB with min A.    Balance Overall balance assessment: Needs assistance Sitting-balance support: Feet supported;Bilateral upper extremity supported Sitting balance-Leahy Scale: Fair   Postural control: Right lateral lean                                 ADL either performed or assessed with clinical judgement   ADL Overall ADL's : Needs assistance/impaired Eating/Feeding: Set up;Sitting   Grooming: Wash/dry hands;Wash/dry face;Set up;Sitting   Upper Body Bathing: Set up;Min guard;Sitting   Lower Body Bathing: Moderate assistance;Sitting/lateral leans;Sit to/from stand   Upper Body Dressing : Set up;Sitting   Lower Body Dressing: Moderate assistance;Sitting/lateral leans   Toilet Transfer: Total assistance;+2 for physical assistance;+2 for safety/equipment   Toileting- Clothing Manipulation and Hygiene: Moderate assistance;Maximal assistance;Sitting/lateral lean   Tub/ Shower Transfer: Total assistance;+2 for physical assistance;+2 for safety/equipment   Functional mobility during ADLs: Maximal assistance;+2 for physical assistance;+2 for safety/equipment General ADL Comments: Pt can complete all UE activities with setup- all mobility and LB ADL's, pt requries at least mod A.     Vision Baseline Vision/History: Wears glasses Wears Glasses: Reading only Patient Visual Report: No change from baseline Vision Assessment?: No apparent visual deficits     Perception Perception Perception Tested?: No   Praxis Praxis Praxis tested?: Not tested    Pertinent Vitals/Pain Pain Assessment: Faces Faces Pain Scale: Hurts a little bit Pain Location: L residual limb Pain Descriptors / Indicators: Aching;Operative site guarding Pain Intervention(s): Limited activity within patient's tolerance;Repositioned;Monitored during  session     Hand Dominance Right   Extremity/Trunk Assessment Upper Extremity Assessment Upper Extremity Assessment: Generalized  weakness   Lower Extremity Assessment Lower Extremity Assessment: Defer to PT evaluation RLE Deficits / Details: R weakness at baseline. Unable to extend knee fully secondary to contracture. LLE Deficits / Details: s/p L AKA.   Cervical / Trunk Assessment Cervical / Trunk Assessment: Kyphotic;Other exceptions Cervical / Trunk Exceptions: R lateral lean   Communication Communication Communication: HOH   Cognition Arousal/Alertness: Awake/alert Behavior During Therapy: WFL for tasks assessed/performed Overall Cognitive Status: History of cognitive impairments - at baseline                                     General Comments  Pt's daughter present. Educated about SNF, but daughter refusing and wanting to take pt home    Exercises     Shoulder Instructions      Home Living Family/patient expects to be discharged to:: Private residence Living Arrangements: Children;Spouse/significant other Available Help at Discharge: Family;Available 24 hours/day Type of Home: House Home Access: Ramped entrance     Home Layout: One level     Bathroom Shower/Tub:  (Pt reports sponge bathing in WC)   Bathroom Toilet: Standard     Home Equipment: Bedside commode;Wheelchair - manual;Hospital bed          Prior Functioning/Environment Level of Independence: Needs assistance  Gait / Transfers Assistance Needed: Requires significant assist for transfers to/from [facility]. ADL's / Homemaking Assistance Needed: Pt reports independence with bathing. Needs assist with dressing            OT Problem List: Decreased strength;Decreased range of motion;Decreased activity tolerance;Impaired balance (sitting and/or standing);Decreased safety awareness;Decreased knowledge of use of DME or AE;Impaired UE functional use;Impaired sensation      OT Treatment/Interventions: Self-care/ADL training;Therapeutic exercise;Energy conservation;Therapeutic activities;Cognitive  remediation/compensation;Patient/family education;Balance training    OT Goals(Current goals can be found in the care plan section) Acute Rehab OT Goals Patient Stated Goal: for pt to return home OT Goal Formulation: With patient/family Time For Goal Achievement: 10/26/20 Potential to Achieve Goals: Good ADL Goals Pt Will Perform Grooming: with min guard assist Pt Will Perform Upper Body Dressing: with supervision;sitting Pt Will Perform Lower Body Dressing: with min assist;sitting/lateral leans Additional ADL Goal #1: Pt will complete bed mobility at mod I level.  OT Frequency: Min 2X/week   Barriers to D/C:            Co-evaluation              AM-PAC OT "6 Clicks" Daily Activity     Outcome Measure Help from another person eating meals?: A Little Help from another person taking care of personal grooming?: A Little Help from another person toileting, which includes using toliet, bedpan, or urinal?: A Lot Help from another person bathing (including washing, rinsing, drying)?: A Lot Help from another person to put on and taking off regular upper body clothing?: A Little Help from another person to put on and taking off regular lower body clothing?: A Lot 6 Click Score: 15   End of Session Equipment Utilized During Treatment: Gait belt Nurse Communication: Mobility status  Activity Tolerance:   Patient left: in bed;with bed alarm set;with call bell/phone within reach  OT Visit Diagnosis: Other abnormalities of gait and mobility (R26.89);Muscle weakness (generalized) (M62.81);Other symptoms and signs involving the nervous system (R29.898)  Time: 4132-4401 OT Time Calculation (min): 21 min Charges:  OT General Charges $OT Visit: 1 Visit OT Evaluation $OT Eval Moderate Complexity: 1 Mod  Zac Torti H., OTR/L Acute Rehabilitation  [place]ula Donahue Elane Bing Plume 10/12/2020, 2:44 PM

## 2020-10-12 NOTE — Progress Notes (Addendum)
  Progress Note    10/12/2020 7:19 AM 2 Days Post-Op  Subjective:  no complaints except mild soreness  Afebrile  Vitals:   10/11/20 1134 10/11/20 2027  BP: 119/77 111/66  Pulse: 80 69  Resp: 18 18  Temp: 97.6 F (36.4 C) 98.3 F (36.8 C)  SpO2: 92% 94%    Physical Exam: Incisions:  clean and dry with scant bloody drainage.       CBC    Component Value Date/Time   WBC 9.7 10/12/2020 0434   RBC 3.74 (L) 10/12/2020 0434   HGB 9.7 (L) 10/12/2020 0434   HCT 32.0 (L) 10/12/2020 0434   PLT 198 10/12/2020 0434   MCV 85.6 10/12/2020 0434   MCH 25.9 (L) 10/12/2020 0434   MCHC 30.3 10/12/2020 0434   RDW 16.2 (H) 10/12/2020 0434   LYMPHSABS 0.5 (L) 10/11/2020 0341   MONOABS 0.4 10/11/2020 0341   EOSABS 0.0 10/11/2020 0341   BASOSABS 0.0 10/11/2020 0341    BMET    Component Value Date/Time   NA 141 10/12/2020 0434   NA 140 11/05/2015 0000   K 4.1 10/12/2020 0434   CL 106 10/12/2020 0434   CO2 28 10/12/2020 0434   GLUCOSE 81 10/12/2020 0434   BUN 24 (H) 10/12/2020 0434   BUN 20 11/05/2015 0000   CREATININE 1.75 (H) 10/12/2020 0434   CREATININE 1.39 (H) 07/08/2015 1502   CALCIUM 7.8 (L) 10/12/2020 0434   GFRNONAA 40 (L) 10/12/2020 0434   GFRAA 36 (L) 06/15/2019 2212    INR    Component Value Date/Time   INR 1.4 (H) 10/10/2020 1337     Intake/Output Summary (Last 24 hours) at 10/12/2020 0719 Last data filed at 10/12/2020 0700 Gross per 24 hour  Intake 425.43 ml  Output 3000 ml  Net -2574.57 ml     Assessment/Plan:  75 y.o. male is s/p left above knee amputation  2 Days Post-Op  -pt dressing removed and incision looks good with staples in tact. -continue dry dressing changes with 4x4, kerlix and 4x4 gentle ace wrap pressure.  -pain controlled and has not required any pain medication. -pt with wounds on right [place] consulted yesterday with recommendations of Clean leg and wound with saline, pat dry.  Cut to fit silver hydrofiber (Aquacel Ag+),  place over open wounds, top with dry 4x4 gauze.  Wrap from toes to knees with kerlix and 4" [place]  Change 2x week on TUESDAYs and FRIDAYs -pt will f/u in our office in 4-5 weeks for staple removal.  Office will call to schedule appt.    Leontine Locket, PA-C [facility] [phone] 10/12/2020 7:19 AM   I have seen and evaluated the patient. I agree with the PA note as documented above. POD#2 s/p left AKA.  Left AKA looks good after dressing removed.  F/U arranged for staple removal in the office.  Wound care consulted for dressing changes to right leg.  [name], MD [facility] [place] Office: [phone]

## 2020-10-13 DIAGNOSIS — Z978 Presence of other specified devices: Secondary | ICD-10-CM

## 2020-10-13 DIAGNOSIS — M8660 Other chronic osteomyelitis, unspecified site: Secondary | ICD-10-CM

## 2020-10-13 DIAGNOSIS — N320 Bladder-neck obstruction: Secondary | ICD-10-CM

## 2020-10-13 LAB — GLUCOSE, CAPILLARY
Glucose-Capillary: 90 mg/dL (ref 70–99)
Glucose-Capillary: 115 mg/dL — ABNORMAL HIGH (ref 70–99)
Glucose-Capillary: 219 mg/dL — ABNORMAL HIGH (ref 70–99)
Glucose-Capillary: 103 mg/dL — ABNORMAL HIGH (ref 70–99)

## 2020-10-13 MED ORDER — INSULIN ASPART 100 UNIT/ML ~~LOC~~ SOLN: 3.0000 [IU] | Freq: Three times a day (TID) | SUBCUTANEOUS | Status: AC

## 2020-10-13 MED ORDER — LOSARTAN POTASSIUM 25 MG PO TABS: 12.5000 mg | ORAL_TABLET | Freq: Every day | ORAL | Status: DC

## 2020-10-13 MED ORDER — LANTUS SOLOSTAR 100 UNIT/ML ~~LOC~~ SOPN: 10.0000 [IU] | PEN_INJECTOR | Freq: Every day | SUBCUTANEOUS | Status: AC

## 2020-10-13 NOTE — Plan of Care (Signed)
  Problem: Health Behavior/Discharge Planning: Goal: Ability to manage health-related needs will improve Outcome: Adequate for Discharge today

## 2020-10-13 NOTE — Discharge Summary (Signed)
Physician Discharge Summary  [name] [name] DOB: Oct 30, 1945 DOA: 10/10/2020  PCP: [name], [name] G, MD  Admit date: 10/10/2020 Discharge date: 10/13/2020  Admitted From: Home Disposition: Home ([facility])  Recommendations for Outpatient Follow-up:  Follow up with PCP in 1 week Follow up with vascular surgery Please obtain BMP/CBC in one week Please follow up on the following pending results: Blood cultures (6/22)  Home Health: OT, RN, Aide Equipment/Devices: [place] lift  Discharge Condition: Stable CODE STATUS: Full code Diet recommendation: Carb modified   Brief/Interim Summary:  Admission HPI written by Lequita Halt, MD   HPI: [name] [name] is a 75 y.o. male with medical history significant of CVA with right-sided paresis and wheelchair-bound, chronic left leg ulcer, chronic mild systolic and diastolic CHF,(LVEF 15 to 62%) status post ICD, multivessel CAD, NSVT, CKD stage IV, chronic indwelling Foley catheter, IDDM, HTN, BPH, was brought into hospital today for elective left-sided AKA.   Patient developed multiple unhealing ulcers this year on left heel, cough, dorsal foot, for which he has been following with wound care infectious disease and vascular surgery.  After serious discussion with infection disease and vascular surgeon, concern raised about recurrent infection may become uncontrolled and risk patient AICD, recently decision was made for patient to undergo left AKA.   As per vascular surgeon report, patient underwent AKA today on the left side without event, estimated total blood loss about 150 mL.  Patient in the recovery room no complain about pain or shortness of breath.   Right now, patient has no complaint about chest pain, shortness of breath, he feels sleepy probably because still under influence of anesthesia earlier.   Hospital course:  Left leg nonhealing wounds Chronic osteomyelitis Patient was seen and evaluated by vascular  surgery.  Patient underwent successful elective left AKA on 6/20. Vascular surgery with recommendations for outpatient follow-up. Infectious disease consulted and recommended no antibiotics but did recommend blood cultures which were obtained on 6/22. If blood cultures are positive, patient will need Transthoracic Echocardiogram and IV antibiotics in addition to prompt ID referral.   Chronic combined systolic and diastolic heart failure CAD     Patient's EF of 20-25% from April 2022.  Patient is status post ICD placement.  Patient follows with advanced heart failure team.  He is currently on torsemide, Coreg and losartan as an outpatient. Continue torsemide, Coreg and losartan   Primary hypertension Patient is on Coreg and losartan as an outpatient as mentioned above. Continue Coreg and losartan  CKD stage IIIb Stable.   Diabetes mellitus, type II Patient is on Lantus as an outpatient.  Lantus was decreased from 35 units to 15 units while inpatient. On discharge, recommendation for Lantus 10 units daily and Novolog 3 units TID with meals   UTI Diagnosis.  Urinalysis.  No notable symptoms.  No fevers.  Patient started empirically on ceftriaxone.  Complicated by chronic indwelling Foley catheter. Since patient was without symptoms, infectious disease recommended discontinuing antibiotics. Patient received 2 days of Ceftriaxone.   Bladder outlet obstruction BPH Patient is currently on finasteride, tamsulosin and has a chronic indwelling Foley. Continue finasteride and tamsulosin. Catheter exchanged prior to discharge.   Murmur Unsure if this is old or new.  Patient with a recent echo in April 2022 which was only significant for trivial mitral valve regurgitation. Discussed with infectious disease in setting of chronic osteomyelitis with recommendation for no Transthoracic Echocardiogram at this time.   History of nonsustained V. tach  In setting of severe dilated cardiomyopathy.  Patient has an  ICD in place.  Patient is also on amiodarone. Continue on discharge.   Hyperlipidemia Patient is on Crestor 40 mg daily as an outpatient. Continue Crestor   Pressure injury Medial sacrum.  Present on admission.  Discharge Diagnoses:  Active Problems:   Bladder outlet obstruction   CKD (chronic kidney disease) stage 3, GFR 30-59 ml/min (HCC)   Chronic combined systolic and diastolic congestive heart failure (HCC)   Diabetic polyneuropathy associated with type 2 diabetes mellitus (HCC)   Non-healing wound of lower extremity   Non-healing wound of left lower extremity   Pressure injury of skin   Chronic osteomyelitis (HCC)   Pyuria   Chronic indwelling Foley catheter    Discharge Instructions   Allergies as of 10/13/2020       Reactions   Tape Other (See Comments)   SKIN IS VERY FRAGILE- WILL TEAR AND BRUISE EASILY!!   Sulfa Antibiotics Rash        Medication List     TAKE these medications    amiodarone 200 MG tablet Commonly known as: PACERONE TAKE 1 TABLET EVERY DAY What changed: additional instructions   aspirin EC 81 MG tablet Take 81 mg by mouth daily.   B-D SINGLE USE SWABS REGULAR Pads USE TO TEST BLOOD SUGAR DAILY   Blood Pressure Monitor Automat Devi To check BP daily   carvedilol 3.125 MG tablet Commonly known as: COREG TAKE 1 TABLET (3.125 MG TOTAL) BY MOUTH 2 (TWO) TIMES DAILY WITH A MEAL. MUST KEEP PENDING APPT FOR FURTHER REFILLS What changed: how much to take   clopidogrel 75 MG tablet Commonly known as: PLAVIX TAKE 1 TABLET EVERY DAY   clotrimazole-betamethasone cream Commonly known as: LOTRISONE Apply 1 application topically daily as needed. mall amount.   finasteride 5 MG tablet Commonly known as: PROSCAR TAKE 1 TABLET EVERY DAY   HYDROcodone-acetaminophen 5-325 MG tablet Commonly known as: NORCO/VICODIN Take 1 tablet by mouth every 4 (four) hours as needed for moderate pain.   insulin aspart 100 UNIT/ML injection Commonly  known as: novoLOG Inject 3 Units into the skin 3 (three) times daily with meals. What changed: See the new instructions.   Lantus SoloStar 100 UNIT/ML Solostar Pen Generic drug: insulin glargine Inject 10 Units into the skin at bedtime. What changed: how much to take   losartan 25 MG tablet Commonly known as: COZAAR Take 0.5 tablets (12.5 mg total) by mouth daily. What changed: when to take this   Magnesium 500 MG Tabs Take 500 mg by mouth every other day. In the morning   mirtazapine 15 MG tablet Commonly known as: REMERON TAKE 1 TABLET (15 MG TOTAL) BY MOUTH AT BEDTIME.   multivitamin with minerals Tabs tablet Take 1 tablet by mouth daily. One-A-Day   rosuvastatin 40 MG tablet Commonly known as: CRESTOR TAKE 1 TABLET EVERY DAY   Santyl ointment Generic drug: collagenase Apply 1 application topically as needed (for wound care).   SILVER NITRATE EX Apply 1 application topically daily as needed (wound care).   tamsulosin 0.4 MG Caps capsule Commonly known as: FLOMAX TAKE 1 CAPSULE EVERY DAY   torsemide 20 MG tablet Commonly known as: DEMADEX TAKE 1 TABLET EVERY OTHER DAY   True Metrix Air Glucose Meter w/Device Kit 1 Device by Does not apply route daily.   True Metrix Blood Glucose Test test strip Generic drug: glucose blood USE  AS  INSTRUCTED   True Metrix Level  1 Low Soln USE TO TEST BLOOD SUGAR DAILY   Vitamin A 3 MG (10000 UT) Tabs Take 10,000 mcg by mouth daily.   vitamin C 500 MG tablet Commonly known as: ASCORBIC ACID Take 500 mg by mouth daily.   zinc gluconate 50 MG tablet Take 50 mg by mouth daily.       ASK your doctor about these medications    nystatin powder Commonly known as: MYCOSTATIN/NYSTOP Apply topically 3 (three) times daily.   TRUEplus Lancets 30G Misc USE  TO  TEST BLOOD SUGAR EVERY DAY               Durable Medical Equipment  (From admission, onward)           Start     Ordered   10/13/20 1150  For home  use only DME Other see comment  Once       Comments: [facility] lift  Question:  Length of Need  Answer:  Lifetime   10/13/20 1149            Follow-up Information     [facility] Follow up in 5 week(s).   Specialty: Vascular Surgery Why: Office will call you to arrange your appt (sent) Contact information: [place] [phone]        [name], [name] G, MD Follow up in 1 week(s).   Specialty: Family Medicine Why: For hospital follow-up Contact information: [place] [address] [phone]                Allergies  Allergen Reactions   Tape Other (See Comments)    SKIN IS VERY FRAGILE- WILL TEAR AND BRUISE EASILY!!   Sulfa Antibiotics Rash    Consultations: Vascular surgery   Procedures/Studies: DG Chest 1 View  Result Date: 10/10/2020 CLINICAL DATA:  Status post left leg amputation, initial encounter EXAM: CHEST  1 VIEW COMPARISON:  05/26/2020 FINDINGS: Cardiac shadow is enlarged but stable. Defibrillator is again noted. Mild irregularity in the midportion of the lead is noted. Clinical correlation is recommended. Lungs are well aerated bilaterally. Mild right basilar atelectasis is seen. No bony abnormality is noted. IMPRESSION: Mild right basilar atelectasis. Mild irregularity of the defibrillator lead of uncertain significance. Clinical correlation is recommended. Electronically Signed   By: Inez Catalina M.D.   On: 10/10/2020 17:57      Subjective: No concerns this morning. No issues overnight.  Discharge Exam: Vitals:   10/13/20 0747 10/13/20 1136  BP: 116/62 95/60  Pulse: 73 75  Resp: 20 19  Temp: 98.6 F (37 C) 98.3 F (36.8 C)  SpO2: 97% 100%   Vitals:   10/12/20 1712 10/12/20 2040 10/13/20 0747 10/13/20 1136  BP: 125/67 126/68 116/62 95/60  Pulse: 80 75 73 75  Resp:  _0 Temp:  99.2 F (37.3 C) 98.6 F (37 C) 98.3 F (36.8 C)  TempSrc:  Oral  Oral Oral  SpO2:  94% 97% 100%  Weight:      Height:        General: Pt is alert, awake, not in acute distress Cardiovascular: RRR, S1/S2 +, no rubs, no gallops. 1/6 systolic murmur Respiratory: CTA bilaterally, no wheezing, no rhonchi Abdominal: Soft, NT, ND, bowel sounds + Extremities: no edema, no cyanosis. Left AKA    The results of significant diagnostics from this hospitalization (including imaging, microbiology, ancillary and laboratory) are listed below for reference.     Microbiology:  Recent Results (from the past 240 hour(s))  SARS CORONAVIRUS 2 (TAT 6-24 HRS) Nasopharyngeal Nasopharyngeal Swab     Status: None   Collection Time: 10/06/20 11:12 AM   Specimen: Nasopharyngeal Swab  Result Value Ref Range Status   SARS Coronavirus 2 NEGATIVE NEGATIVE Final    Comment: (NOTE) SARS-CoV-2 target nucleic acids are NOT DETECTED.  The SARS-CoV-2 RNA is generally detectable in upper and lower respiratory specimens during the acute phase of infection. Negative results do not preclude SARS-CoV-2 infection, do not rule out co-infections with other pathogens, and should not be used as the sole basis for treatment or other patient management decisions. Negative results must be combined with clinical observations, patient history, and epidemiological information. The expected result is Negative.  Fact Sheet for Patients: SugarRoll.be  Fact Sheet for Healthcare Providers: https://www.woods-mathews.com/  This test is not yet approved or cleared by the [place] FDA and  has been authorized for detection and/or diagnosis of SARS-CoV-2 by FDA under an Emergency Use Authorization (EUA). This EUA will remain  in effect (meaning this test can be used) for the duration of the COVID-19 declaration under Se ction 564(b)(1) of the Act, 21 U.S.C. section 360bbb-3(b)(1), unless the authorization is terminated or revoked sooner.  Performed at  [facility], [place] [address] [place], [place] [address]   Culture, blood (routine x 2)     Status: None (Preliminary result)   Collection Time: 10/12/20  1:28 PM   Specimen: BLOOD LEFT WRIST  Result Value Ref Range Status   Specimen Description BLOOD LEFT WRIST  Final   Special Requests   Final    BOTTLES DRAWN AEROBIC AND ANAEROBIC Blood Culture adequate volume   Culture   Final    NO GROWTH < 24 HOURS Performed at [facility], [place] [address] [place], [place] [address]    Report Status PENDING  Incomplete  Culture, blood (routine x 2)     Status: None (Preliminary result)   Collection Time: 10/12/20  1:34 PM   Specimen: BLOOD LEFT HAND  Result Value Ref Range Status   Specimen Description BLOOD LEFT HAND  Final   Special Requests   Final    BOTTLES DRAWN AEROBIC AND ANAEROBIC Blood Culture adequate volume   Culture   Final    NO GROWTH < 24 HOURS Performed at [facility], [place] [address], [place], [place] [address]    Report Status PENDING  Incomplete  MRSA Next Gen by PCR, Nasal     Status: Abnormal   Collection Time: 10/12/20  2:49 PM   Specimen: Nasal Mucosa; Nasal Swab  Result Value Ref Range Status   MRSA by PCR Next Gen DETECTED (A) NOT DETECTED Final    Comment: RESULT CALLED TO, READ BACK BY AND VERIFIED WITH: Wandalee Ferdinand RN [id number] 10/12/20 A BROWNING (NOTE) The GeneXpert MRSA Assay (FDA approved for NASAL specimens only), is one component of a comprehensive MRSA colonization surveillance program. It is not intended to diagnose MRSA infection nor to guide or monitor treatment for MRSA infections. Test performance is not FDA approved in patients less than 24 years old. Performed at [facility], [place] [address], [place], [place] [address]      Labs: BNP (last 3 results) No results for input(s): BNP in the last 8760 hours. Basic Metabolic Panel: Recent Labs  Lab 10/10/20 1420 10/11/20 0341 10/12/20 0434  NA 143 142 141  K 4.1 4.9 4.1   CL  109 109 106  CO2 _0 GLUCOSE 124* 210* 81  BUN 24* 26* 24*  CREATININE 1.78* 1.73* 1.75*  CALCIUM 7.9* 7.9* 7.8*  MG  --   --  1.8  PHOS  --   --  2.4*   Liver Function Tests: Recent Labs  Lab 10/10/20 1420 10/12/20 0434  AST 20 19  ALT 11 7  ALKPHOS 106 87  BILITOT 0.7 0.7  PROT 6.8 5.7*  ALBUMIN 2.0* 1.8*   No results for input(s): LIPASE, AMYLASE in the last 168 hours. No results for input(s): AMMONIA in the last 168 hours. CBC: Recent Labs  Lab 10/10/20 1250 10/11/20 0341 10/12/20 0434  WBC 9.3 10.5 9.7  NEUTROABS  --  9.5*  --   HGB 10.9* 9.5* 9.7*  HCT 37.2* 31.8* 32.0*  MCV 87.3 85.7 85.6  PLT 209 180 198   Cardiac Enzymes: No results for input(s): CKTOTAL, CKMB, CKMBINDEX, TROPONINI in the last 168 hours. BNP: Invalid input(s): POCBNP CBG: Recent Labs  Lab 10/12/20 0804 10/12/20 1139 10/12/20 1654 10/13/20 0810 10/13/20 1135  GLUCAP 73 117* 124* 90 115*   D-Dimer No results for input(s): DDIMER in the last 72 hours. Hgb A1c Recent Labs    10/10/20 1835  HGBA1C 6.0*   Lipid Profile No results for input(s): CHOL, HDL, LDLCALC, TRIG, CHOLHDL, LDLDIRECT in the last 72 hours. Thyroid function studies No results for input(s): TSH, T4TOTAL, T3FREE, THYROIDAB in the last 72 hours.  Invalid input(s): FREET3 Anemia work up No results for input(s): VITAMINB12, FOLATE, FERRITIN, TIBC, IRON, RETICCTPCT in the last 72 hours. Urinalysis    Component Value Date/Time   COLORURINE YELLOW 10/10/2020 1251   APPEARANCEUR CLOUDY (A) 10/10/2020 1251   LABSPEC 1.016 10/10/2020 1251   PHURINE 7.0 10/10/2020 1251   GLUCOSEU NEGATIVE 10/10/2020 1251   HGBUR SMALL (A) 10/10/2020 1251   BILIRUBINUR NEGATIVE 10/10/2020 1251   BILIRUBINUR negative 11/28/2015 1442   KETONESUR NEGATIVE 10/10/2020 1251   PROTEINUR 100 (A) 10/10/2020 1251   UROBILINOGEN negative 11/28/2015 1442   UROBILINOGEN 0.2 09/06/2011 0908   NITRITE NEGATIVE 10/10/2020 1251    LEUKOCYTESUR LARGE (A) 10/10/2020 1251   Sepsis Labs Invalid input(s): PROCALCITONIN,  WBC,  LACTICIDVEN Microbiology Recent Results (from the past 240 hour(s))  SARS CORONAVIRUS 2 (TAT 6-24 HRS) Nasopharyngeal Nasopharyngeal Swab     Status: None   Collection Time: 10/06/20 11:12 AM   Specimen: Nasopharyngeal Swab  Result Value Ref Range Status   SARS Coronavirus 2 NEGATIVE NEGATIVE Final    Comment: (NOTE) SARS-CoV-2 target nucleic acids are NOT DETECTED.  The SARS-CoV-2 RNA is generally detectable in upper and lower respiratory specimens during the acute phase of infection. Negative results do not preclude SARS-CoV-2 infection, do not rule out co-infections with other pathogens, and should not be used as the sole basis for treatment or other patient management decisions. Negative results must be combined with clinical observations, patient history, and epidemiological information. The expected result is Negative.  Fact Sheet for Patients: SugarRoll.be  Fact Sheet for Healthcare Providers: https://www.woods-mathews.com/  This test is not yet approved or cleared by the [place] FDA and  has been authorized for detection and/or diagnosis of SARS-CoV-2 by FDA under an Emergency Use Authorization (EUA). This EUA will remain  in effect (meaning this test can be used) for the duration of the COVID-19 declaration under Se ction 564(b)(1) of the Act, 21 U.S.C. section 360bbb-3(b)(1), unless the authorization is terminated or revoked sooner.  Performed  at [facility], [place] [address], [place], [place] [address]   Culture, blood (routine x 2)     Status: None (Preliminary result)   Collection Time: 10/12/20  1:28 PM   Specimen: BLOOD LEFT WRIST  Result Value Ref Range Status   Specimen Description BLOOD LEFT WRIST  Final   Special Requests   Final    BOTTLES DRAWN AEROBIC AND ANAEROBIC Blood Culture adequate volume   Culture    Final    NO GROWTH < 24 HOURS Performed at [facility], [place] [address] [place], [place] [address]    Report Status PENDING  Incomplete  Culture, blood (routine x 2)     Status: None (Preliminary result)   Collection Time: 10/12/20  1:34 PM   Specimen: BLOOD LEFT HAND  Result Value Ref Range Status   Specimen Description BLOOD LEFT HAND  Final   Special Requests   Final    BOTTLES DRAWN AEROBIC AND ANAEROBIC Blood Culture adequate volume   Culture   Final    NO GROWTH < 24 HOURS Performed at [facility], [place] [address], [place], [place] [address]    Report Status PENDING  Incomplete  MRSA Next Gen by PCR, Nasal     Status: Abnormal   Collection Time: 10/12/20  2:49 PM   Specimen: Nasal Mucosa; Nasal Swab  Result Value Ref Range Status   MRSA by PCR Next Gen DETECTED (A) NOT DETECTED Final    Comment: RESULT CALLED TO, READ BACK BY AND VERIFIED WITH: Wandalee Ferdinand RN [id number] 10/12/20 A BROWNING (NOTE) The GeneXpert MRSA Assay (FDA approved for NASAL specimens only), is one component of a comprehensive MRSA colonization surveillance program. It is not intended to diagnose MRSA infection nor to guide or monitor treatment for MRSA infections. Test performance is not FDA approved in patients less than 57 years old. Performed at [facility], [place] [address], [place], [place] [address]      Time coordinating discharge: 35 minutes  SIGNED:   Cordelia Poche, MD [facility] 10/13/2020, 12:20 PM

## 2020-10-13 NOTE — Progress Notes (Signed)
  Progress Note    10/13/2020 7:38 AM 3 Days Post-Op  Subjective: No complaints   Vitals:   10/12/20 1712 10/12/20 2040  BP: 125/67 126/68  Pulse: 80 75  Resp:  19  Temp:  99.2 F (37.3 C)  SpO2:  94%    Physical Exam: Incisions:  L AKA incision c/d/I with viable skin edges   CBC    Component Value Date/Time   WBC 9.7 10/12/2020 0434   RBC 3.74 (L) 10/12/2020 0434   HGB 9.7 (L) 10/12/2020 0434   HCT 32.0 (L) 10/12/2020 0434   PLT 198 10/12/2020 0434   MCV 85.6 10/12/2020 0434   MCH 25.9 (L) 10/12/2020 0434   MCHC 30.3 10/12/2020 0434   RDW 16.2 (H) 10/12/2020 0434   LYMPHSABS 0.5 (L) 10/11/2020 0341   MONOABS 0.4 10/11/2020 0341   EOSABS 0.0 10/11/2020 0341   BASOSABS 0.0 10/11/2020 0341    BMET    Component Value Date/Time   NA 141 10/12/2020 0434   NA 140 11/05/2015 0000   K 4.1 10/12/2020 0434   CL 106 10/12/2020 0434   CO2 28 10/12/2020 0434   GLUCOSE 81 10/12/2020 0434   BUN 24 (H) 10/12/2020 0434   BUN 20 11/05/2015 0000   CREATININE 1.75 (H) 10/12/2020 0434   CREATININE 1.39 (H) 07/08/2015 1502   CALCIUM 7.8 (L) 10/12/2020 0434   GFRNONAA 40 (L) 10/12/2020 0434   GFRAA 36 (L) 06/15/2019 2212    INR    Component Value Date/Time   INR 1.4 (H) 10/10/2020 1337     Intake/Output Summary (Last 24 hours) at 10/13/2020 0738 Last data filed at 10/13/2020 0500 Gross per 24 hour  Intake 240 ml  Output 1500 ml  Net -1260 ml     Assessment/Plan:  75 y.o. male is s/p left above knee amputation  3 Days Post-Op  - L AKA incision healing well - Ok for discharge from vascular standpoint - Will arrange staple removal in 4-6 weeks   Dagoberto Ligas, PA-C [facility] [phone] 10/13/2020 7:38 AM

## 2020-10-13 NOTE — TOC Transition Note (Signed)
Transition of Care [facility]) - CM/SW Discharge Note   Patient Details  Name: [name] [name] MRN: [id number] Date of Birth: 22-Jun-1945  [facility] CM/SW Contact:  Sharin Mons, RN Phone Number: 10/13/2020, 4:57 PM   Clinical Narrative:     Patient will DC [id number]  Anticipated DC date:10/13/2020 Family notified: yes, [name] [name] [phone] Transport by: [name]        - s/p L AKA on 6/20. PMH includes CVA with R sided weakness, DM, MI, HTN, CAD, CKD, and CHF  Pt/ [name] [name] (daughter) agreeable to home health services. Declined SNF placement. Pt without preference for home health services. Referral made [facility] and accepted,SOC within 48 hrs post d/c. Referral made for hoyer lift with [facility].  Per MD patient ready for DC today. RN, patient, patient's family, and [name] notified of DC. Ambulance transport requested for patient. Nurse to place transport papers on front of chart.   Pt without Rx med concerns.   RNCM will sign off for now as intervention is no longer needed. Please consult [place] again if new needs arise.   Final next level of care: [place] Barriers to Discharge: No Barriers Identified   Patient Goals and CMS Choice     Choice offered to / list presented to : Patient, Adult Children  Discharge Placement                       Discharge Plan and Services                          HH Arranged: RN, PT, OT, Nurse's Aide [place] Agency: [place] Date [facility] Agency Contacted: 10/13/20 Time [place]: 1656 Representative spoke with at [place]: [place] ([place]) Interventions     Readmission Risk Interventions No flowsheet data found.

## 2020-10-13 NOTE — Progress Notes (Signed)
Physical Therapy Treatment Patient Details Name: [name] [name] MRN: [id number] DOB: 1945-11-08 Today's Date: 10/13/2020    History of Present Illness Pt is a 75 y/o male s/p L AKA on 6/20. PMH includes CVA with R sided weakness, DM, MI, HTN, CAD, CKD, and CHF.    PT Comments    Pt with increased motivation to get out of bed. Increased participation in bed mobility as well as transfer. Pt +1 for lateral transfers using sliding board. Pt states his w/c doesn't have drop arm will need to follow up with daughter. Pt will benefit from skilled PT to address deficits in strength, coordination, endurance, safety and DME training to maximize independence with functional mobility prior to discharge.     Follow Up Recommendations  Home health PT;Supervision/Assistance - 24 hour (HHOT/HHRN/HHaide; pt's daughter refusing SNF)     Equipment Recommendations  Other (comment) (hoyer lift with pad)    Recommendations for Other Services       Precautions / Restrictions Precautions Precautions: Fall;Other (comment) Precaution Comments: L AKA Restrictions Weight Bearing Restrictions: Yes LLE Weight Bearing: Non weight bearing    Mobility  Bed Mobility Overal bed mobility: Needs Assistance Bed Mobility: Supine to Sit     Supine to sit: Mod assist;HOB elevated     General bed mobility comments: Required assist for trunk and RLE. Increased time required.    Transfers Overall transfer level: Needs assistance Equipment used: Sliding board Transfers: Lateral/Scoot Transfers          Lateral/Scoot Transfers: Mod assist General transfer comment: mod A needed during sliding board transfer from bed to recliner (drop arm). Mod A needed due to poor clearance as well as increased posterior lean. increased time needed  Ambulation/Gait                 Stairs             Wheelchair Mobility    Modified Rankin (Stroke Patients Only)       Balance                                             Cognition                                              Exercises      General Comments General comments (skin integrity, edema, etc.): pt states daughter can help and has helped. Pt states he is bed bound at home. Asked pt if his w/c has the ability to remove arm rests, will follow up wtih daugther.      Pertinent Vitals/Pain Pain Assessment: 0-10 Pain Score: 4  Pain Location: L residual limb    Home Living                      Prior Function            PT Goals (current goals can now be found in the care plan section) Acute Rehab PT Goals Patient Stated Goal: for pt to return home PT Goal Formulation: With patient/family Time For Goal Achievement: 10/26/20 Potential to Achieve Goals: Fair Progress towards PT goals: Progressing toward goals    Frequency    Min 3X/week      PT  Plan Current plan remains appropriate    Co-evaluation              AM-PAC PT "6 Clicks" Mobility   Outcome Measure  Help needed turning from your back to your side while in a flat bed without using bedrails?: None Help needed moving from lying on your back to sitting on the side of a flat bed without using bedrails?: A Lot Help needed moving to and from a bed to a chair (including a wheelchair)?: A Lot Help needed standing up from a chair using your arms (e.g., wheelchair or bedside chair)?: Total Help needed to walk in hospital room?: Total Help needed climbing 3-5 steps with a railing? : Total 6 Click Score: 11    End of Session Equipment Utilized During Treatment: Gait belt Activity Tolerance: Patient tolerated treatment well Patient left: in chair;with call bell/phone within reach;with chair alarm set Nurse Communication: Mobility status PT Visit Diagnosis: Unsteadiness on feet (R26.81);Muscle weakness (generalized) (M62.81);Difficulty in walking, not elsewhere classified (R26.2)     Time: 0912-0926 PT  Time Calculation (min) (ACUTE ONLY): 14 min  Charges:  $Therapeutic Activity: 8-22 mins                     Lyanne Co, DPT Acute Rehabilitation Services [id number]    [place] 10/13/2020, 10:40 AM

## 2020-10-13 NOTE — Discharge Instructions (Signed)
[name] [name],  You were in the hospital in need of a leg amputation. This was performed successfully. Please follow-up with your PCP and vascular surgery. You also had blood cultures obtained secondary to your history of osteomyelitis. So far, this is negative, but we will watch for the next 5 days.

## 2020-10-14 ENCOUNTER — Other Ambulatory Visit: Payer: Self-pay

## 2020-10-14 ENCOUNTER — Telehealth: Payer: Self-pay

## 2020-10-14 DIAGNOSIS — I5042 Chronic combined systolic (congestive) and diastolic (congestive) heart failure: Secondary | ICD-10-CM | POA: Diagnosis not present

## 2020-10-14 DIAGNOSIS — IMO0002 Reserved for concepts with insufficient information to code with codable children: Secondary | ICD-10-CM

## 2020-10-14 DIAGNOSIS — Z4781 Encounter for orthopedic aftercare following surgical amputation: Secondary | ICD-10-CM | POA: Diagnosis not present

## 2020-10-14 DIAGNOSIS — N184 Chronic kidney disease, stage 4 (severe): Secondary | ICD-10-CM | POA: Diagnosis not present

## 2020-10-14 DIAGNOSIS — Z7401 Bed confinement status: Secondary | ICD-10-CM | POA: Diagnosis not present

## 2020-10-14 DIAGNOSIS — E1149 Type 2 diabetes mellitus with other diabetic neurological complication: Secondary | ICD-10-CM

## 2020-10-14 DIAGNOSIS — I2510 Atherosclerotic heart disease of native coronary artery without angina pectoris: Secondary | ICD-10-CM | POA: Diagnosis not present

## 2020-10-14 DIAGNOSIS — M2550 Pain in unspecified joint: Secondary | ICD-10-CM | POA: Diagnosis not present

## 2020-10-14 DIAGNOSIS — E1142 Type 2 diabetes mellitus with diabetic polyneuropathy: Secondary | ICD-10-CM | POA: Diagnosis not present

## 2020-10-14 DIAGNOSIS — I69351 Hemiplegia and hemiparesis following cerebral infarction affecting right dominant side: Secondary | ICD-10-CM | POA: Diagnosis not present

## 2020-10-14 DIAGNOSIS — E1122 Type 2 diabetes mellitus with diabetic chronic kidney disease: Secondary | ICD-10-CM | POA: Diagnosis not present

## 2020-10-14 DIAGNOSIS — I130 Hypertensive heart and chronic kidney disease with heart failure and stage 1 through stage 4 chronic kidney disease, or unspecified chronic kidney disease: Secondary | ICD-10-CM | POA: Diagnosis not present

## 2020-10-14 DIAGNOSIS — Z89612 Acquired absence of left leg above knee: Secondary | ICD-10-CM | POA: Diagnosis not present

## 2020-10-14 NOTE — Plan of Care (Signed)
  Problem: Pain Managment: Goal: General experience of comfort will improve Outcome: Progressing   Problem: Safety: Goal: Ability to remain free from injury will improve Outcome: Progressing   Problem: Skin Integrity: Goal: Risk for impaired skin integrity will decrease Outcome: Progressing   

## 2020-10-14 NOTE — Telephone Encounter (Signed)
Transition Care Management Unsuccessful Follow-up Telephone Call  Date of discharge and from where:  [name] 10/13/2020  Attempts:  1st Attempt  Reason for unsuccessful TCM follow-up call:  No answer/busy

## 2020-10-14 NOTE — Patient Outreach (Signed)
[facility]) Care Management  10/14/2020  MALAKYE ROCKEFELLER 1946-01-17 [id number]   [facility] [[facility]] Patient: [facility]  [name], [name] G, MD [facility]  Patient screened for hospitalization with noted high risk score for unplanned readmission risk and  to assess for potential  [place] Management service needs for post hospital transition.  Review of patient's medical record reveals patient is declined Skilled nursing facility for rehab.   Plan:  Follow up with [facility] refer for Embedded CCM RN follow up.  For questions contact:   Natividad Brood, RN BSN [facility] Liaison  [phone] business mobile phone Toll free office [phone]  Fax number: [phone] [email or web address]

## 2020-10-14 NOTE — Progress Notes (Signed)
Patient discharged at this time. Patient daughter, [name] was called to notify her that [name] [name] will be transporting patient home.

## 2020-10-15 DIAGNOSIS — N184 Chronic kidney disease, stage 4 (severe): Secondary | ICD-10-CM | POA: Diagnosis not present

## 2020-10-15 DIAGNOSIS — E1142 Type 2 diabetes mellitus with diabetic polyneuropathy: Secondary | ICD-10-CM | POA: Diagnosis not present

## 2020-10-15 DIAGNOSIS — Z4781 Encounter for orthopedic aftercare following surgical amputation: Secondary | ICD-10-CM | POA: Diagnosis not present

## 2020-10-15 DIAGNOSIS — I2510 Atherosclerotic heart disease of native coronary artery without angina pectoris: Secondary | ICD-10-CM | POA: Diagnosis not present

## 2020-10-15 DIAGNOSIS — Z89612 Acquired absence of left leg above knee: Secondary | ICD-10-CM | POA: Diagnosis not present

## 2020-10-15 DIAGNOSIS — I5042 Chronic combined systolic (congestive) and diastolic (congestive) heart failure: Secondary | ICD-10-CM | POA: Diagnosis not present

## 2020-10-15 DIAGNOSIS — E1122 Type 2 diabetes mellitus with diabetic chronic kidney disease: Secondary | ICD-10-CM | POA: Diagnosis not present

## 2020-10-15 DIAGNOSIS — I69351 Hemiplegia and hemiparesis following cerebral infarction affecting right dominant side: Secondary | ICD-10-CM | POA: Diagnosis not present

## 2020-10-15 DIAGNOSIS — I130 Hypertensive heart and chronic kidney disease with heart failure and stage 1 through stage 4 chronic kidney disease, or unspecified chronic kidney disease: Secondary | ICD-10-CM | POA: Diagnosis not present

## 2020-10-17 ENCOUNTER — Telehealth: Payer: Self-pay

## 2020-10-17 ENCOUNTER — Telehealth: Payer: Self-pay | Admitting: Family Medicine

## 2020-10-17 DIAGNOSIS — Z4781 Encounter for orthopedic aftercare following surgical amputation: Secondary | ICD-10-CM | POA: Diagnosis not present

## 2020-10-17 DIAGNOSIS — E1142 Type 2 diabetes mellitus with diabetic polyneuropathy: Secondary | ICD-10-CM | POA: Diagnosis not present

## 2020-10-17 DIAGNOSIS — I2510 Atherosclerotic heart disease of native coronary artery without angina pectoris: Secondary | ICD-10-CM | POA: Diagnosis not present

## 2020-10-17 DIAGNOSIS — I69351 Hemiplegia and hemiparesis following cerebral infarction affecting right dominant side: Secondary | ICD-10-CM | POA: Diagnosis not present

## 2020-10-17 DIAGNOSIS — I130 Hypertensive heart and chronic kidney disease with heart failure and stage 1 through stage 4 chronic kidney disease, or unspecified chronic kidney disease: Secondary | ICD-10-CM | POA: Diagnosis not present

## 2020-10-17 DIAGNOSIS — Z89612 Acquired absence of left leg above knee: Secondary | ICD-10-CM | POA: Diagnosis not present

## 2020-10-17 DIAGNOSIS — I5042 Chronic combined systolic (congestive) and diastolic (congestive) heart failure: Secondary | ICD-10-CM | POA: Diagnosis not present

## 2020-10-17 DIAGNOSIS — E1122 Type 2 diabetes mellitus with diabetic chronic kidney disease: Secondary | ICD-10-CM | POA: Diagnosis not present

## 2020-10-17 DIAGNOSIS — N184 Chronic kidney disease, stage 4 (severe): Secondary | ICD-10-CM | POA: Diagnosis not present

## 2020-10-17 LAB — CULTURE, BLOOD (ROUTINE X 2)
Culture: NO GROWTH
Culture: NO GROWTH
Special Requests: ADEQUATE
Special Requests: ADEQUATE

## 2020-10-17 NOTE — Telephone Encounter (Signed)
Transition Care Management Unsuccessful Follow-up Telephone Call  Date of discharge and from where:  [name] [name] 10/13/2020 Attempts:  2nd Attempt  Reason for unsuccessful TCM follow-up call:  No answer/busy   /

## 2020-10-17 NOTE — Telephone Encounter (Signed)
Don-Occupational Therapist states patient has not had a bowel movement since 06/20.  Patient had above knee amputation of L leg on 10/10/20 as well.   OT plan of care--  Twice a week for 1 wk 1 time a week for 3 weeks *For ADL transfers and exercise  Please call pt and occupational therapist both back with any information on constipation issues.

## 2020-10-17 NOTE — Telephone Encounter (Signed)
I called and spoke with patient. He hasn't had a bowel movement since he left the hospital. He has not tried Miralax, he is going to give it a try. He will call me back if he is still unable to have a bowel movement & we will get him scheduled to see pcp.  I left Don a [employer] the orders and letting him know the plan & that I had spoken with patient.

## 2020-10-18 ENCOUNTER — Telehealth: Payer: Self-pay | Admitting: Family Medicine

## 2020-10-18 DIAGNOSIS — Z89612 Acquired absence of left leg above knee: Secondary | ICD-10-CM | POA: Diagnosis not present

## 2020-10-18 DIAGNOSIS — I69351 Hemiplegia and hemiparesis following cerebral infarction affecting right dominant side: Secondary | ICD-10-CM | POA: Diagnosis not present

## 2020-10-18 DIAGNOSIS — N184 Chronic kidney disease, stage 4 (severe): Secondary | ICD-10-CM | POA: Diagnosis not present

## 2020-10-18 DIAGNOSIS — I5042 Chronic combined systolic (congestive) and diastolic (congestive) heart failure: Secondary | ICD-10-CM | POA: Diagnosis not present

## 2020-10-18 DIAGNOSIS — I2510 Atherosclerotic heart disease of native coronary artery without angina pectoris: Secondary | ICD-10-CM | POA: Diagnosis not present

## 2020-10-18 DIAGNOSIS — E1142 Type 2 diabetes mellitus with diabetic polyneuropathy: Secondary | ICD-10-CM | POA: Diagnosis not present

## 2020-10-18 DIAGNOSIS — Z4781 Encounter for orthopedic aftercare following surgical amputation: Secondary | ICD-10-CM | POA: Diagnosis not present

## 2020-10-18 DIAGNOSIS — I130 Hypertensive heart and chronic kidney disease with heart failure and stage 1 through stage 4 chronic kidney disease, or unspecified chronic kidney disease: Secondary | ICD-10-CM | POA: Diagnosis not present

## 2020-10-18 DIAGNOSIS — E1122 Type 2 diabetes mellitus with diabetic chronic kidney disease: Secondary | ICD-10-CM | POA: Diagnosis not present

## 2020-10-18 NOTE — Telephone Encounter (Signed)
Verbal given 

## 2020-10-18 NOTE — Telephone Encounter (Signed)
Caprice Red from [place] OT calling for verbal orders for Training in heart failure, pain control, diabetic foot care, ulcer prevention, ADL, and Transfers/Exercise  Frequency  2 times a week for 1 week 1 time a week for 1 week 0 time a week for 1 week  1 time a week for 1 week  Caprice Red [place] OT [phone]

## 2020-10-19 ENCOUNTER — Telehealth: Payer: Self-pay

## 2020-10-19 DIAGNOSIS — E1122 Type 2 diabetes mellitus with diabetic chronic kidney disease: Secondary | ICD-10-CM | POA: Diagnosis not present

## 2020-10-19 DIAGNOSIS — I2510 Atherosclerotic heart disease of native coronary artery without angina pectoris: Secondary | ICD-10-CM | POA: Diagnosis not present

## 2020-10-19 DIAGNOSIS — I130 Hypertensive heart and chronic kidney disease with heart failure and stage 1 through stage 4 chronic kidney disease, or unspecified chronic kidney disease: Secondary | ICD-10-CM | POA: Diagnosis not present

## 2020-10-19 DIAGNOSIS — N184 Chronic kidney disease, stage 4 (severe): Secondary | ICD-10-CM | POA: Diagnosis not present

## 2020-10-19 DIAGNOSIS — Z89612 Acquired absence of left leg above knee: Secondary | ICD-10-CM | POA: Diagnosis not present

## 2020-10-19 DIAGNOSIS — E1142 Type 2 diabetes mellitus with diabetic polyneuropathy: Secondary | ICD-10-CM | POA: Diagnosis not present

## 2020-10-19 DIAGNOSIS — Z4781 Encounter for orthopedic aftercare following surgical amputation: Secondary | ICD-10-CM | POA: Diagnosis not present

## 2020-10-19 DIAGNOSIS — I5042 Chronic combined systolic (congestive) and diastolic (congestive) heart failure: Secondary | ICD-10-CM | POA: Diagnosis not present

## 2020-10-19 DIAGNOSIS — I69351 Hemiplegia and hemiparesis following cerebral infarction affecting right dominant side: Secondary | ICD-10-CM | POA: Diagnosis not present

## 2020-10-19 NOTE — Telephone Encounter (Signed)
Patient's daughter calls today to ask about HH removing the staples s/p AKA on 6/20. Advised her that they would need to stay in at least 4 weeks to ensure wound closure/healing. She says the area is a little red around the actual staples, but otherwise looks good and her dad is "doing better than I expected." She also wanted to know about MRSA results - advised her that blood cultures taken on 6/22 did not show growth after 5 days. Patient has [place] and a home aide set up. They will keep post op appt as scheduled.

## 2020-10-20 DIAGNOSIS — N184 Chronic kidney disease, stage 4 (severe): Secondary | ICD-10-CM | POA: Diagnosis not present

## 2020-10-20 DIAGNOSIS — I2510 Atherosclerotic heart disease of native coronary artery without angina pectoris: Secondary | ICD-10-CM | POA: Diagnosis not present

## 2020-10-20 DIAGNOSIS — Z89612 Acquired absence of left leg above knee: Secondary | ICD-10-CM | POA: Diagnosis not present

## 2020-10-20 DIAGNOSIS — E1142 Type 2 diabetes mellitus with diabetic polyneuropathy: Secondary | ICD-10-CM | POA: Diagnosis not present

## 2020-10-20 DIAGNOSIS — I130 Hypertensive heart and chronic kidney disease with heart failure and stage 1 through stage 4 chronic kidney disease, or unspecified chronic kidney disease: Secondary | ICD-10-CM | POA: Diagnosis not present

## 2020-10-20 DIAGNOSIS — I69351 Hemiplegia and hemiparesis following cerebral infarction affecting right dominant side: Secondary | ICD-10-CM | POA: Diagnosis not present

## 2020-10-20 DIAGNOSIS — Z4781 Encounter for orthopedic aftercare following surgical amputation: Secondary | ICD-10-CM | POA: Diagnosis not present

## 2020-10-20 DIAGNOSIS — E1122 Type 2 diabetes mellitus with diabetic chronic kidney disease: Secondary | ICD-10-CM | POA: Diagnosis not present

## 2020-10-20 DIAGNOSIS — I5042 Chronic combined systolic (congestive) and diastolic (congestive) heart failure: Secondary | ICD-10-CM | POA: Diagnosis not present

## 2020-10-21 DIAGNOSIS — I130 Hypertensive heart and chronic kidney disease with heart failure and stage 1 through stage 4 chronic kidney disease, or unspecified chronic kidney disease: Secondary | ICD-10-CM | POA: Diagnosis not present

## 2020-10-21 DIAGNOSIS — I5042 Chronic combined systolic (congestive) and diastolic (congestive) heart failure: Secondary | ICD-10-CM | POA: Diagnosis not present

## 2020-10-21 DIAGNOSIS — I2510 Atherosclerotic heart disease of native coronary artery without angina pectoris: Secondary | ICD-10-CM | POA: Diagnosis not present

## 2020-10-21 DIAGNOSIS — Z4781 Encounter for orthopedic aftercare following surgical amputation: Secondary | ICD-10-CM | POA: Diagnosis not present

## 2020-10-21 DIAGNOSIS — N184 Chronic kidney disease, stage 4 (severe): Secondary | ICD-10-CM | POA: Diagnosis not present

## 2020-10-21 DIAGNOSIS — Z89612 Acquired absence of left leg above knee: Secondary | ICD-10-CM | POA: Diagnosis not present

## 2020-10-21 DIAGNOSIS — I69351 Hemiplegia and hemiparesis following cerebral infarction affecting right dominant side: Secondary | ICD-10-CM | POA: Diagnosis not present

## 2020-10-21 DIAGNOSIS — E1122 Type 2 diabetes mellitus with diabetic chronic kidney disease: Secondary | ICD-10-CM | POA: Diagnosis not present

## 2020-10-21 DIAGNOSIS — E1142 Type 2 diabetes mellitus with diabetic polyneuropathy: Secondary | ICD-10-CM | POA: Diagnosis not present

## 2020-10-25 ENCOUNTER — Telehealth: Payer: Self-pay | Admitting: Family Medicine

## 2020-10-25 DIAGNOSIS — I5042 Chronic combined systolic (congestive) and diastolic (congestive) heart failure: Secondary | ICD-10-CM | POA: Diagnosis not present

## 2020-10-25 DIAGNOSIS — I2510 Atherosclerotic heart disease of native coronary artery without angina pectoris: Secondary | ICD-10-CM | POA: Diagnosis not present

## 2020-10-25 DIAGNOSIS — I130 Hypertensive heart and chronic kidney disease with heart failure and stage 1 through stage 4 chronic kidney disease, or unspecified chronic kidney disease: Secondary | ICD-10-CM | POA: Diagnosis not present

## 2020-10-25 DIAGNOSIS — Z4781 Encounter for orthopedic aftercare following surgical amputation: Secondary | ICD-10-CM | POA: Diagnosis not present

## 2020-10-25 DIAGNOSIS — E1142 Type 2 diabetes mellitus with diabetic polyneuropathy: Secondary | ICD-10-CM | POA: Diagnosis not present

## 2020-10-25 DIAGNOSIS — N184 Chronic kidney disease, stage 4 (severe): Secondary | ICD-10-CM | POA: Diagnosis not present

## 2020-10-25 DIAGNOSIS — E1122 Type 2 diabetes mellitus with diabetic chronic kidney disease: Secondary | ICD-10-CM | POA: Diagnosis not present

## 2020-10-25 DIAGNOSIS — Z89612 Acquired absence of left leg above knee: Secondary | ICD-10-CM | POA: Diagnosis not present

## 2020-10-25 DIAGNOSIS — I69351 Hemiplegia and hemiparesis following cerebral infarction affecting right dominant side: Secondary | ICD-10-CM | POA: Diagnosis not present

## 2020-10-25 NOTE — Telephone Encounter (Signed)
Tried calling patient schedule Medicare Annual Wellness Visit (AWV) either virtually or in office.  Patient stated he could not hear me and hung up   Last AWV 12/04/16  please schedule at anytime with [facility] Nurse Health Advisor 1 or 2   This should be a 45 minute visit.

## 2020-10-26 DIAGNOSIS — I5042 Chronic combined systolic (congestive) and diastolic (congestive) heart failure: Secondary | ICD-10-CM | POA: Diagnosis not present

## 2020-10-26 DIAGNOSIS — E1142 Type 2 diabetes mellitus with diabetic polyneuropathy: Secondary | ICD-10-CM | POA: Diagnosis not present

## 2020-10-26 DIAGNOSIS — Z4781 Encounter for orthopedic aftercare following surgical amputation: Secondary | ICD-10-CM | POA: Diagnosis not present

## 2020-10-26 DIAGNOSIS — N184 Chronic kidney disease, stage 4 (severe): Secondary | ICD-10-CM | POA: Diagnosis not present

## 2020-10-26 DIAGNOSIS — I69351 Hemiplegia and hemiparesis following cerebral infarction affecting right dominant side: Secondary | ICD-10-CM | POA: Diagnosis not present

## 2020-10-26 DIAGNOSIS — Z89612 Acquired absence of left leg above knee: Secondary | ICD-10-CM | POA: Diagnosis not present

## 2020-10-26 DIAGNOSIS — I2510 Atherosclerotic heart disease of native coronary artery without angina pectoris: Secondary | ICD-10-CM | POA: Diagnosis not present

## 2020-10-26 DIAGNOSIS — I130 Hypertensive heart and chronic kidney disease with heart failure and stage 1 through stage 4 chronic kidney disease, or unspecified chronic kidney disease: Secondary | ICD-10-CM | POA: Diagnosis not present

## 2020-10-26 DIAGNOSIS — E1122 Type 2 diabetes mellitus with diabetic chronic kidney disease: Secondary | ICD-10-CM | POA: Diagnosis not present

## 2020-10-27 DIAGNOSIS — Z4781 Encounter for orthopedic aftercare following surgical amputation: Secondary | ICD-10-CM | POA: Diagnosis not present

## 2020-10-27 DIAGNOSIS — Z89612 Acquired absence of left leg above knee: Secondary | ICD-10-CM | POA: Diagnosis not present

## 2020-10-27 DIAGNOSIS — I69351 Hemiplegia and hemiparesis following cerebral infarction affecting right dominant side: Secondary | ICD-10-CM | POA: Diagnosis not present

## 2020-10-27 DIAGNOSIS — E1142 Type 2 diabetes mellitus with diabetic polyneuropathy: Secondary | ICD-10-CM | POA: Diagnosis not present

## 2020-10-27 DIAGNOSIS — I130 Hypertensive heart and chronic kidney disease with heart failure and stage 1 through stage 4 chronic kidney disease, or unspecified chronic kidney disease: Secondary | ICD-10-CM | POA: Diagnosis not present

## 2020-10-27 DIAGNOSIS — E1122 Type 2 diabetes mellitus with diabetic chronic kidney disease: Secondary | ICD-10-CM | POA: Diagnosis not present

## 2020-10-27 DIAGNOSIS — I2510 Atherosclerotic heart disease of native coronary artery without angina pectoris: Secondary | ICD-10-CM | POA: Diagnosis not present

## 2020-10-27 DIAGNOSIS — N184 Chronic kidney disease, stage 4 (severe): Secondary | ICD-10-CM | POA: Diagnosis not present

## 2020-10-27 DIAGNOSIS — I5042 Chronic combined systolic (congestive) and diastolic (congestive) heart failure: Secondary | ICD-10-CM | POA: Diagnosis not present

## 2020-10-28 ENCOUNTER — Telehealth: Payer: Self-pay

## 2020-10-28 DIAGNOSIS — E1122 Type 2 diabetes mellitus with diabetic chronic kidney disease: Secondary | ICD-10-CM | POA: Diagnosis not present

## 2020-10-28 DIAGNOSIS — Z4781 Encounter for orthopedic aftercare following surgical amputation: Secondary | ICD-10-CM | POA: Diagnosis not present

## 2020-10-28 DIAGNOSIS — I69351 Hemiplegia and hemiparesis following cerebral infarction affecting right dominant side: Secondary | ICD-10-CM | POA: Diagnosis not present

## 2020-10-28 DIAGNOSIS — I130 Hypertensive heart and chronic kidney disease with heart failure and stage 1 through stage 4 chronic kidney disease, or unspecified chronic kidney disease: Secondary | ICD-10-CM | POA: Diagnosis not present

## 2020-10-28 DIAGNOSIS — N184 Chronic kidney disease, stage 4 (severe): Secondary | ICD-10-CM | POA: Diagnosis not present

## 2020-10-28 DIAGNOSIS — I5042 Chronic combined systolic (congestive) and diastolic (congestive) heart failure: Secondary | ICD-10-CM | POA: Diagnosis not present

## 2020-10-28 DIAGNOSIS — E1142 Type 2 diabetes mellitus with diabetic polyneuropathy: Secondary | ICD-10-CM | POA: Diagnosis not present

## 2020-10-28 DIAGNOSIS — I2510 Atherosclerotic heart disease of native coronary artery without angina pectoris: Secondary | ICD-10-CM | POA: Diagnosis not present

## 2020-10-28 DIAGNOSIS — Z89612 Acquired absence of left leg above knee: Secondary | ICD-10-CM | POA: Diagnosis not present

## 2020-10-28 NOTE — Telephone Encounter (Signed)
Tried to contact patient. The number on file keeps saying that were sorry your call can not be completed at this time. Please hang up and try your call again later.

## 2020-10-28 NOTE — Telephone Encounter (Signed)
Can we schedule pt for a follow up? He hasn't seen Dr. [name] since 06/2019. Thanks!

## 2020-11-01 ENCOUNTER — Encounter (HOSPITAL_BASED_OUTPATIENT_CLINIC_OR_DEPARTMENT_OTHER): Payer: Medicare HMO | Attending: Internal Medicine | Admitting: Internal Medicine

## 2020-11-01 ENCOUNTER — Other Ambulatory Visit: Payer: Self-pay

## 2020-11-01 DIAGNOSIS — M109 Gout, unspecified: Secondary | ICD-10-CM | POA: Insufficient documentation

## 2020-11-01 DIAGNOSIS — N183 Chronic kidney disease, stage 3 unspecified: Secondary | ICD-10-CM | POA: Diagnosis not present

## 2020-11-01 DIAGNOSIS — Z8673 Personal history of transient ischemic attack (TIA), and cerebral infarction without residual deficits: Secondary | ICD-10-CM | POA: Diagnosis not present

## 2020-11-01 DIAGNOSIS — X58XXXA Exposure to other specified factors, initial encounter: Secondary | ICD-10-CM | POA: Diagnosis not present

## 2020-11-01 DIAGNOSIS — I252 Old myocardial infarction: Secondary | ICD-10-CM | POA: Insufficient documentation

## 2020-11-01 DIAGNOSIS — L97218 Non-pressure chronic ulcer of right calf with other specified severity: Secondary | ICD-10-CM | POA: Insufficient documentation

## 2020-11-01 DIAGNOSIS — E11622 Type 2 diabetes mellitus with other skin ulcer: Secondary | ICD-10-CM | POA: Diagnosis not present

## 2020-11-01 DIAGNOSIS — R6889 Other general symptoms and signs: Secondary | ICD-10-CM | POA: Diagnosis not present

## 2020-11-01 DIAGNOSIS — I130 Hypertensive heart and chronic kidney disease with heart failure and stage 1 through stage 4 chronic kidney disease, or unspecified chronic kidney disease: Secondary | ICD-10-CM | POA: Diagnosis not present

## 2020-11-01 DIAGNOSIS — S80211A Abrasion, right knee, initial encounter: Secondary | ICD-10-CM | POA: Diagnosis not present

## 2020-11-01 DIAGNOSIS — E1122 Type 2 diabetes mellitus with diabetic chronic kidney disease: Secondary | ICD-10-CM | POA: Insufficient documentation

## 2020-11-01 DIAGNOSIS — L97512 Non-pressure chronic ulcer of other part of right foot with fat layer exposed: Secondary | ICD-10-CM | POA: Diagnosis not present

## 2020-11-01 DIAGNOSIS — E11621 Type 2 diabetes mellitus with foot ulcer: Secondary | ICD-10-CM | POA: Diagnosis not present

## 2020-11-01 DIAGNOSIS — L97518 Non-pressure chronic ulcer of other part of right foot with other specified severity: Secondary | ICD-10-CM | POA: Insufficient documentation

## 2020-11-01 DIAGNOSIS — I872 Venous insufficiency (chronic) (peripheral): Secondary | ICD-10-CM | POA: Diagnosis not present

## 2020-11-01 DIAGNOSIS — Z89612 Acquired absence of left leg above knee: Secondary | ICD-10-CM | POA: Diagnosis not present

## 2020-11-01 DIAGNOSIS — I5042 Chronic combined systolic (congestive) and diastolic (congestive) heart failure: Secondary | ICD-10-CM | POA: Insufficient documentation

## 2020-11-01 DIAGNOSIS — I255 Ischemic cardiomyopathy: Secondary | ICD-10-CM | POA: Insufficient documentation

## 2020-11-01 DIAGNOSIS — L97812 Non-pressure chronic ulcer of other part of right lower leg with fat layer exposed: Secondary | ICD-10-CM | POA: Diagnosis not present

## 2020-11-01 DIAGNOSIS — Z95810 Presence of automatic (implantable) cardiac defibrillator: Secondary | ICD-10-CM | POA: Diagnosis not present

## 2020-11-01 DIAGNOSIS — I2510 Atherosclerotic heart disease of native coronary artery without angina pectoris: Secondary | ICD-10-CM | POA: Diagnosis not present

## 2020-11-02 NOTE — Progress Notes (Signed)
Artery Disease, Hypertension, Myocardial Infarction, Clustered Wound: No Clustered Wound: No Type II Diabetes, Gout, Neuropathy Wound Measurements Length: (cm) 0.8 Width: (cm) 0.5 Depth: (cm) 0.1 Area: (cm) 0.314 Volume: (cm) 0.031 % Reduction in Area: % Reduction in Volume: Epithelialization: None Tunneling: No Undermining: No Wound Description Classification: Full Thickness Without Exposed Support Structures Wound Margin: Distinct, outline attached Exudate Amount: Medium Exudate Type: Serosanguineous Exudate Color: red, brown Foul Odor After Cleansing: No Slough/Fibrino No Wound Bed Granulation Amount: Large (67-100%) Exposed Structure Granulation Quality: Pink, Pale Fascia Exposed: No Necrotic Amount: None Present (0%) Fat Layer (Subcutaneous Tissue) Exposed: Yes Tendon Exposed: No Muscle Exposed: No Joint Exposed: No Bone Exposed: No Treatment Notes Wound #38 (Knee) Wound Laterality: Right Cleanser Soap and Water Discharge Instruction: May shower and wash wound with dial antibacterial soap and water prior to dressing change. Wound Cleanser Discharge Instruction: Cleanse the wound with wound cleanser prior to applying a clean dressing using gauze sponges, not tissue or cotton balls. Peri-Wound Care Topical Primary Dressing KerraCel Ag Gelling Fiber Dressing, 4x5 in (silver alginate) Discharge Instruction: Apply silver alginate to wound bed as instructed Secondary Dressing Woven Gauze Sponge, Non-Sterile 4x4  in Discharge Instruction: Apply over primary dressing as directed. ABD Pad, 5x9 Discharge Instruction: Apply over primary dressing as directed. Secured With Compression Wrap Kerlix Roll 4.5x3.1 (in/yd) Discharge Instruction: Apply Kerlix and Coban compression as directed. Coban Self-Adherent Wrap 4x5 (in/yd) Discharge Instruction: Apply over Kerlix as directed. Compression Stockings Add-Ons Electronic Signature(s) Signed: 11/01/2020 6:16:29 PM By: Deon Pilling Entered By: Deon Pilling on 11/01/2020 10:13:56 -------------------------------------------------------------------------------- Vitals Details Patient Name: Date of Service: [name] Foley F. 11/01/2020 9:30 A M Medical Record Number: [id number] Patient Account Number: [id number] Date of Birth/Sex: Treating RN: 1946/01/30 (75 y.o. [name] [name] Primary Care Bodie Abernethy: [name], [name] Other Clinician: Referring Kisha Messman: Treating Sandrea Boer/Extender: [name], [name] [name], [name] Weeks in Treatment: 237 Vital Signs Time Taken: 09:50 Temperature (F): 98.4 Height (in): 74 Pulse (bpm): 66 Weight (lbs): 150 Respiratory Rate (breaths/min): 20 Body Mass Index (BMI): 19.3 Blood Pressure (mmHg): 115/68 Capillary Blood Glucose (mg/dl): 122 Reference Range: 80 - 120 mg / dl Electronic Signature(s) Signed: 11/01/2020 6:16:29 PM By: Deon Pilling Entered By: Deon Pilling on 11/01/2020 09:58:34  Artery Disease, Hypertension, Myocardial Infarction, Clustered Wound: No Clustered Wound: No Type II Diabetes, Gout, Neuropathy Wound Measurements Length: (cm) 0.8 Width: (cm) 0.5 Depth: (cm) 0.1 Area: (cm) 0.314 Volume: (cm) 0.031 % Reduction in Area: % Reduction in Volume: Epithelialization: None Tunneling: No Undermining: No Wound Description Classification: Full Thickness Without Exposed Support Structures Wound Margin: Distinct, outline attached Exudate Amount: Medium Exudate Type: Serosanguineous Exudate Color: red, brown Foul Odor After Cleansing: No Slough/Fibrino No Wound Bed Granulation Amount: Large (67-100%) Exposed Structure Granulation Quality: Pink, Pale Fascia Exposed: No Necrotic Amount: None Present (0%) Fat Layer (Subcutaneous Tissue) Exposed: Yes Tendon Exposed: No Muscle Exposed: No Joint Exposed: No Bone Exposed: No Treatment Notes Wound #38 (Knee) Wound Laterality: Right Cleanser Soap and Water Discharge Instruction: May shower and wash wound with dial antibacterial soap and water prior to dressing change. Wound Cleanser Discharge Instruction: Cleanse the wound with wound cleanser prior to applying a clean dressing using gauze sponges, not tissue or cotton balls. Peri-Wound Care Topical Primary Dressing KerraCel Ag Gelling Fiber Dressing, 4x5 in (silver alginate) Discharge Instruction: Apply silver alginate to wound bed as instructed Secondary Dressing Woven Gauze Sponge, Non-Sterile 4x4  in Discharge Instruction: Apply over primary dressing as directed. ABD Pad, 5x9 Discharge Instruction: Apply over primary dressing as directed. Secured With Compression Wrap Kerlix Roll 4.5x3.1 (in/yd) Discharge Instruction: Apply Kerlix and Coban compression as directed. Coban Self-Adherent Wrap 4x5 (in/yd) Discharge Instruction: Apply over Kerlix as directed. Compression Stockings Add-Ons Electronic Signature(s) Signed: 11/01/2020 6:16:29 PM By: Deon Pilling Entered By: Deon Pilling on 11/01/2020 10:13:56 -------------------------------------------------------------------------------- Vitals Details Patient Name: Date of Service: [name] Foley F. 11/01/2020 9:30 A M Medical Record Number: [id number] Patient Account Number: [id number] Date of Birth/Sex: Treating RN: 1946/01/30 (75 y.o. [name] [name] Primary Care Bodie Abernethy: [name], [name] Other Clinician: Referring Kisha Messman: Treating Sandrea Boer/Extender: [name], [name] [name], [name] Weeks in Treatment: 237 Vital Signs Time Taken: 09:50 Temperature (F): 98.4 Height (in): 74 Pulse (bpm): 66 Weight (lbs): 150 Respiratory Rate (breaths/min): 20 Body Mass Index (BMI): 19.3 Blood Pressure (mmHg): 115/68 Capillary Blood Glucose (mg/dl): 122 Reference Range: 80 - 120 mg / dl Electronic Signature(s) Signed: 11/01/2020 6:16:29 PM By: Deon Pilling Entered By: Deon Pilling on 11/01/2020 09:58:34  Artery Disease, Hypertension, Myocardial Infarction, Clustered Wound: No Clustered Wound: No Type II Diabetes, Gout, Neuropathy Wound Measurements Length: (cm) 0.8 Width: (cm) 0.5 Depth: (cm) 0.1 Area: (cm) 0.314 Volume: (cm) 0.031 % Reduction in Area: % Reduction in Volume: Epithelialization: None Tunneling: No Undermining: No Wound Description Classification: Full Thickness Without Exposed Support Structures Wound Margin: Distinct, outline attached Exudate Amount: Medium Exudate Type: Serosanguineous Exudate Color: red, brown Foul Odor After Cleansing: No Slough/Fibrino No Wound Bed Granulation Amount: Large (67-100%) Exposed Structure Granulation Quality: Pink, Pale Fascia Exposed: No Necrotic Amount: None Present (0%) Fat Layer (Subcutaneous Tissue) Exposed: Yes Tendon Exposed: No Muscle Exposed: No Joint Exposed: No Bone Exposed: No Treatment Notes Wound #38 (Knee) Wound Laterality: Right Cleanser Soap and Water Discharge Instruction: May shower and wash wound with dial antibacterial soap and water prior to dressing change. Wound Cleanser Discharge Instruction: Cleanse the wound with wound cleanser prior to applying a clean dressing using gauze sponges, not tissue or cotton balls. Peri-Wound Care Topical Primary Dressing KerraCel Ag Gelling Fiber Dressing, 4x5 in (silver alginate) Discharge Instruction: Apply silver alginate to wound bed as instructed Secondary Dressing Woven Gauze Sponge, Non-Sterile 4x4  in Discharge Instruction: Apply over primary dressing as directed. ABD Pad, 5x9 Discharge Instruction: Apply over primary dressing as directed. Secured With Compression Wrap Kerlix Roll 4.5x3.1 (in/yd) Discharge Instruction: Apply Kerlix and Coban compression as directed. Coban Self-Adherent Wrap 4x5 (in/yd) Discharge Instruction: Apply over Kerlix as directed. Compression Stockings Add-Ons Electronic Signature(s) Signed: 11/01/2020 6:16:29 PM By: Deon Pilling Entered By: Deon Pilling on 11/01/2020 10:13:56 -------------------------------------------------------------------------------- Vitals Details Patient Name: Date of Service: [name] Foley F. 11/01/2020 9:30 A M Medical Record Number: [id number] Patient Account Number: [id number] Date of Birth/Sex: Treating RN: 1946/01/30 (75 y.o. [name] [name] Primary Care Bodie Abernethy: [name], [name] Other Clinician: Referring Kisha Messman: Treating Sandrea Boer/Extender: [name], [name] [name], [name] Weeks in Treatment: 237 Vital Signs Time Taken: 09:50 Temperature (F): 98.4 Height (in): 74 Pulse (bpm): 66 Weight (lbs): 150 Respiratory Rate (breaths/min): 20 Body Mass Index (BMI): 19.3 Blood Pressure (mmHg): 115/68 Capillary Blood Glucose (mg/dl): 122 Reference Range: 80 - 120 mg / dl Electronic Signature(s) Signed: 11/01/2020 6:16:29 PM By: Deon Pilling Entered By: Deon Pilling on 11/01/2020 09:58:34  Cleanser Discharge Instruction: Cleanse the wound with wound cleanser prior to applying a clean dressing using gauze sponges, not tissue or cotton balls. Soap and Water Discharge Instruction: May shower and wash wound with dial antibacterial soap and water prior to dressing  change. Peri-Wound Care Triamcinolone 15 (g) Discharge Instruction: Use triamcinolone 15 (g) as directed Sween Lotion (Moisturizing lotion) Discharge Instruction: Apply moisturizing lotion as directed Topical Primary Dressing IODOFLEX 0.9% Cadexomer Iodine Pad 4x6 cm Discharge Instruction: You can use iodosorb if you can't get iodoflex.... Apply to wound bed as instructed Secondary Dressing Woven Gauze Sponge, Non-Sterile 4x4 in Discharge Instruction: Apply over primary dressing as directed. ABD Pad, 5x9 Discharge Instruction: Apply over primary dressing as directed. Secured With Compression Wrap Kerlix Roll 4.5x3.1 (in/yd) Discharge Instruction: Apply Kerlix and Coban compression as directed. Coban Self-Adherent Wrap 4x5 (in/yd) Discharge Instruction: Apply over Kerlix as directed. Compression Stockings Add-Ons Electronic Signature(s) Signed: 11/01/2020 6:16:29 PM By: Deon Pilling Entered By: Deon Pilling on 11/01/2020 10:14:52 -------------------------------------------------------------------------------- Wound Assessment Details Patient Name: Date of Service: [name] [name] [facility] F. 11/01/2020 9:30 A M Medical Record Number: [id number] Patient Account Number: [id number] Date of Birth/Sex: Treating RN: Jun 14, 1945 (75 y.o. [name] [name] Primary Care Danis Pembleton: [name], [name] Other Clinician: Referring Aylanie Cubillos: Treating Patrena Santalucia/Extender: [name], [name] [name], [name] Weeks in Treatment: 237 Wound Status Wound Number: 35 Primary Etiology: Diabetic Wound/Ulcer of the Lower Extremity Wound Location: Left, Lateral, Dorsal Foot Wound Status: Amputation Wounding Event: Gradually Appeared Outcome Level: Above Knee Date Acquired: 05/24/2020 Weeks Of Treatment: 20 Clustered Wound: No Wound Measurements Length: (cm) Width: (cm) Depth: (cm) Area: (cm) Volume: (cm) 0 % Reduction in Area: 100% 0 % Reduction in Volume: 100% 0 0 0 Wound Description Classification: Grade  2 Treatment Notes Wound #35 (Foot) Wound Laterality: Dorsal, Left, Lateral Cleanser Peri-Wound Care Topical Primary Dressing Secondary Dressing Secured With Compression Wrap Compression Stockings Add-Ons Electronic Signature(s) Signed: 11/01/2020 6:16:29 PM By: Deon Pilling Entered By: Deon Pilling on 11/01/2020 10:03:10 -------------------------------------------------------------------------------- Wound Assessment Details Patient Name: Date of Service: [name] [name] [facility] F. 11/01/2020 9:30 A M Medical Record Number: [id number] Patient Account Number: [id number] Date of Birth/Sex: Treating RN: 05/29/1945 (75 y.o. [name] [name] Primary Care Shakeeta Godette: [name], [name] Other Clinician: Referring Hamdi Kley: Treating Voshon Petro/Extender: [name], [name] [name], [name] Weeks in Treatment: 237 Wound Status Wound Number: 36 Primary Etiology: Venous Leg Ulcer Wound Location: Left, Lateral Foot Wound Status: Amputation Wounding Event: Gradually Appeared Outcome Level: Above Knee Date Acquired: 07/19/2020 Weeks Of Treatment: 14 Clustered Wound: No Wound Measurements Length: (cm) Width: (cm) Depth: (cm) Area: (cm) Volume: (cm) 0 % Reduction in Area: 100% 0 % Reduction in Volume: 100% 0 0 0 Wound Description Classification: Full Thickness With Exposed Support Structures Treatment Notes Wound #36 (Foot) Wound Laterality: Left, Lateral Cleanser Peri-Wound Care Topical Primary Dressing Secondary Dressing Secured With Compression Wrap Compression Stockings Add-Ons Electronic Signature(s) Signed: 11/01/2020 6:16:29 PM By: Deon Pilling Entered By: Deon Pilling on 11/01/2020 10:03:10 -------------------------------------------------------------------------------- Wound Assessment Details Patient Name: Date of Service: [name] [name] [facility] F. 11/01/2020 9:30 A M Medical Record Number: [id number] Patient Account Number: [id number] Date of Birth/Sex: Treating RN: 04-01-1946  (75 y.o. [name] [name] Primary Care Lameeka Schleifer: [name], [name] Other Clinician: Referring Anacarolina Evelyn: Treating Jnae Thomaston/Extender: [name], [name] [name], [name] Weeks in Treatment: 237 Wound Status Wound Number: 38 Primary Abrasion Etiology: Wound Location: Right Knee Wound Open Wounding Event: Bump Status: Date Acquired: 10/30/2020 Comorbid Anemia, Sleep Apnea, Arrhythmia, Congestive Heart Failure, Weeks Of Treatment: 0 History: Coronary  Cleanser Discharge Instruction: Cleanse the wound with wound cleanser prior to applying a clean dressing using gauze sponges, not tissue or cotton balls. Soap and Water Discharge Instruction: May shower and wash wound with dial antibacterial soap and water prior to dressing  change. Peri-Wound Care Triamcinolone 15 (g) Discharge Instruction: Use triamcinolone 15 (g) as directed Sween Lotion (Moisturizing lotion) Discharge Instruction: Apply moisturizing lotion as directed Topical Primary Dressing IODOFLEX 0.9% Cadexomer Iodine Pad 4x6 cm Discharge Instruction: You can use iodosorb if you can't get iodoflex.... Apply to wound bed as instructed Secondary Dressing Woven Gauze Sponge, Non-Sterile 4x4 in Discharge Instruction: Apply over primary dressing as directed. ABD Pad, 5x9 Discharge Instruction: Apply over primary dressing as directed. Secured With Compression Wrap Kerlix Roll 4.5x3.1 (in/yd) Discharge Instruction: Apply Kerlix and Coban compression as directed. Coban Self-Adherent Wrap 4x5 (in/yd) Discharge Instruction: Apply over Kerlix as directed. Compression Stockings Add-Ons Electronic Signature(s) Signed: 11/01/2020 6:16:29 PM By: Deon Pilling Entered By: Deon Pilling on 11/01/2020 10:14:52 -------------------------------------------------------------------------------- Wound Assessment Details Patient Name: Date of Service: [name] [name] [facility] F. 11/01/2020 9:30 A M Medical Record Number: [id number] Patient Account Number: [id number] Date of Birth/Sex: Treating RN: Jun 14, 1945 (75 y.o. [name] [name] Primary Care Danis Pembleton: [name], [name] Other Clinician: Referring Aylanie Cubillos: Treating Patrena Santalucia/Extender: [name], [name] [name], [name] Weeks in Treatment: 237 Wound Status Wound Number: 35 Primary Etiology: Diabetic Wound/Ulcer of the Lower Extremity Wound Location: Left, Lateral, Dorsal Foot Wound Status: Amputation Wounding Event: Gradually Appeared Outcome Level: Above Knee Date Acquired: 05/24/2020 Weeks Of Treatment: 20 Clustered Wound: No Wound Measurements Length: (cm) Width: (cm) Depth: (cm) Area: (cm) Volume: (cm) 0 % Reduction in Area: 100% 0 % Reduction in Volume: 100% 0 0 0 Wound Description Classification: Grade  2 Treatment Notes Wound #35 (Foot) Wound Laterality: Dorsal, Left, Lateral Cleanser Peri-Wound Care Topical Primary Dressing Secondary Dressing Secured With Compression Wrap Compression Stockings Add-Ons Electronic Signature(s) Signed: 11/01/2020 6:16:29 PM By: Deon Pilling Entered By: Deon Pilling on 11/01/2020 10:03:10 -------------------------------------------------------------------------------- Wound Assessment Details Patient Name: Date of Service: [name] [name] [facility] F. 11/01/2020 9:30 A M Medical Record Number: [id number] Patient Account Number: [id number] Date of Birth/Sex: Treating RN: 05/29/1945 (75 y.o. [name] [name] Primary Care Shakeeta Godette: [name], [name] Other Clinician: Referring Hamdi Kley: Treating Voshon Petro/Extender: [name], [name] [name], [name] Weeks in Treatment: 237 Wound Status Wound Number: 36 Primary Etiology: Venous Leg Ulcer Wound Location: Left, Lateral Foot Wound Status: Amputation Wounding Event: Gradually Appeared Outcome Level: Above Knee Date Acquired: 07/19/2020 Weeks Of Treatment: 14 Clustered Wound: No Wound Measurements Length: (cm) Width: (cm) Depth: (cm) Area: (cm) Volume: (cm) 0 % Reduction in Area: 100% 0 % Reduction in Volume: 100% 0 0 0 Wound Description Classification: Full Thickness With Exposed Support Structures Treatment Notes Wound #36 (Foot) Wound Laterality: Left, Lateral Cleanser Peri-Wound Care Topical Primary Dressing Secondary Dressing Secured With Compression Wrap Compression Stockings Add-Ons Electronic Signature(s) Signed: 11/01/2020 6:16:29 PM By: Deon Pilling Entered By: Deon Pilling on 11/01/2020 10:03:10 -------------------------------------------------------------------------------- Wound Assessment Details Patient Name: Date of Service: [name] [name] [facility] F. 11/01/2020 9:30 A M Medical Record Number: [id number] Patient Account Number: [id number] Date of Birth/Sex: Treating RN: 04-01-1946  (75 y.o. [name] [name] Primary Care Lameeka Schleifer: [name], [name] Other Clinician: Referring Anacarolina Evelyn: Treating Jnae Thomaston/Extender: [name], [name] [name], [name] Weeks in Treatment: 237 Wound Status Wound Number: 38 Primary Abrasion Etiology: Wound Location: Right Knee Wound Open Wounding Event: Bump Status: Date Acquired: 10/30/2020 Comorbid Anemia, Sleep Apnea, Arrhythmia, Congestive Heart Failure, Weeks Of Treatment: 0 History: Coronary  14 0 N/A Weeks of Treatment: Amputation Open N/A Wound Status: No No N/A Clustered Wound: 0x0x0 0.8x0.5x0.1 N/A Measurements L x W x D (cm) 0 0.314 N/A A (cm) : rea 0 0.031 N/A Volume (cm) : 100.00% N/A N/A % Reduction in A rea: 100.00% N/A N/A % Reduction in Volume: Full Thickness With Exposed Support Full Thickness Without Exposed N/A Classification: Structures Support Structures N/A Medium N/A Exudate A mount: N/A Serosanguineous N/A Exudate  Type: N/A red, brown N/A Exudate Color: N/A Distinct, outline attached N/A Wound Margin: N/A Large (67-100%) N/A Granulation A mount: N/A Pink, Pale N/A Granulation Quality: N/A None Present (0%) N/A Necrotic A mount: N/A Fat Layer (Subcutaneous Tissue): Yes N/A Exposed Structures: Fascia: No Tendon: No Muscle: No Joint: No Bone: No N/A None N/A Epithelialization: N/A N/A N/A Debridement: N/A N/A N/A Pain Control: N/A N/A N/A Tissue Debrided: N/A N/A N/A Level: N/A N/A N/A Debridement A (sq cm): rea N/A N/A N/A Instrument: N/A N/A N/A Bleeding: N/A N/A N/A Hemostasis A chieved: N/A N/A N/A Procedural Pain: N/A N/A N/A Post Procedural Pain: Debridement Treatment Response: N/A N/A N/A Post Debridement Measurements L x N/A N/A N/A W x D (cm) N/A N/A N/A Post Debridement Volume: (cm) N/A N/A N/A Procedures Performed: Treatment Notes Electronic Signature(s) Signed: 11/01/2020 5:25:56 PM By: Linton Ham MD Signed: 11/02/2020 6:12:45 PM By: [name] Entered By: Linton Ham on 11/01/2020 10:45:46 -------------------------------------------------------------------------------- Multi-Disciplinary Care Plan Details Patient Name: Date of Service: [name] Foley F. 11/01/2020 9:30 A M Medical Record Number: [id number] Patient Account Number: [id number] Date of Birth/Sex: Treating RN: Oct 01, 1945 (75 y.o. [name], [place] Primary Care Eisha Chatterjee: [name], [name] Other Clinician: Referring Tyrek Lawhorn: Treating Shirah Roseman/Extender: [name], [name] [name], [name] Weeks in Treatment: [place] reviewed with physician Active Inactive Wound/Skin Impairment Nursing Diagnoses: Impaired tissue integrity Goals: Patient/caregiver will verbalize understanding of skin care regimen Date Initiated: 11/19/2017 Target Resolution Date: 10/27/2020 Goal Status: Active Ulcer/skin breakdown will have a volume reduction of 30% by week 4 Date  Initiated: 11/19/2017 Date Inactivated: 12/02/2017 Target Resolution Date: 12/22/2017 Goal Status: Unmet Unmet Reason: multipl comorbidities Interventions: Assess patient/caregiver ability to obtain necessary supplies Assess ulceration(s) every visit Provide education on ulcer and skin care Notes: Electronic Signature(s) Signed: 11/02/2020 6:12:45 PM By: [name] Entered By: [name] on 11/01/2020 11:33:22 -------------------------------------------------------------------------------- Pain Assessment Details Patient Name: Date of Service: [name] Foley F. 11/01/2020 9:30 A M Medical Record Number: [id number] Patient Account Number: [id number] Date of Birth/Sex: Treating RN: 06/03/1945 (75 y.o. [name] [name] Primary Care Semaja Lymon: [name], [name] Other Clinician: Referring Brittanyann Wittner: Treating Jakeisha Stricker/Extender: [name], [name] [name], [name] Weeks in Treatment: 237 Active Problems Location of Pain Severity and Description of Pain Patient Has Paino No Site Locations Rate the pain. Current Pain Level: 0 Pain Management and Medication Current Pain Management: Medication: No Cold Application: No Rest: No Massage: No Activity: No T.E.N.S.: No Heat Application: No Leg drop or elevation: No Is the Current Pain Management Adequate: Adequate How does your wound impact your activities of daily livingo Sleep: No Bathing: No Appetite: No Relationship With Others: No Bladder Continence: No Emotions: No Bowel Continence: No Work: No Toileting: No Drive: No Dressing: No Hobbies: No Electronic Signature(s) Signed: 11/01/2020 6:16:29 PM By: Deon Pilling Entered By: Deon Pilling on 11/01/2020 09:58:45 -------------------------------------------------------------------------------- Patient/Caregiver Education Details Patient Name: Date of Service: [name] [name] 7/12/2022andnbsp9:30 A M Medical Record Number: [id number] Patient Account Number:  [id number] Date of Birth/Gender:  Cleanser Discharge Instruction: Cleanse the wound with wound cleanser prior to applying a clean dressing using gauze sponges, not tissue or cotton balls. Soap and Water Discharge Instruction: May shower and wash wound with dial antibacterial soap and water prior to dressing  change. Peri-Wound Care Triamcinolone 15 (g) Discharge Instruction: Use triamcinolone 15 (g) as directed Sween Lotion (Moisturizing lotion) Discharge Instruction: Apply moisturizing lotion as directed Topical Primary Dressing IODOFLEX 0.9% Cadexomer Iodine Pad 4x6 cm Discharge Instruction: You can use iodosorb if you can't get iodoflex.... Apply to wound bed as instructed Secondary Dressing Woven Gauze Sponge, Non-Sterile 4x4 in Discharge Instruction: Apply over primary dressing as directed. ABD Pad, 5x9 Discharge Instruction: Apply over primary dressing as directed. Secured With Compression Wrap Kerlix Roll 4.5x3.1 (in/yd) Discharge Instruction: Apply Kerlix and Coban compression as directed. Coban Self-Adherent Wrap 4x5 (in/yd) Discharge Instruction: Apply over Kerlix as directed. Compression Stockings Add-Ons Electronic Signature(s) Signed: 11/01/2020 6:16:29 PM By: Deon Pilling Entered By: Deon Pilling on 11/01/2020 10:14:52 -------------------------------------------------------------------------------- Wound Assessment Details Patient Name: Date of Service: [name] [name] [facility] F. 11/01/2020 9:30 A M Medical Record Number: [id number] Patient Account Number: [id number] Date of Birth/Sex: Treating RN: Jun 14, 1945 (75 y.o. [name] [name] Primary Care Danis Pembleton: [name], [name] Other Clinician: Referring Aylanie Cubillos: Treating Patrena Santalucia/Extender: [name], [name] [name], [name] Weeks in Treatment: 237 Wound Status Wound Number: 35 Primary Etiology: Diabetic Wound/Ulcer of the Lower Extremity Wound Location: Left, Lateral, Dorsal Foot Wound Status: Amputation Wounding Event: Gradually Appeared Outcome Level: Above Knee Date Acquired: 05/24/2020 Weeks Of Treatment: 20 Clustered Wound: No Wound Measurements Length: (cm) Width: (cm) Depth: (cm) Area: (cm) Volume: (cm) 0 % Reduction in Area: 100% 0 % Reduction in Volume: 100% 0 0 0 Wound Description Classification: Grade  2 Treatment Notes Wound #35 (Foot) Wound Laterality: Dorsal, Left, Lateral Cleanser Peri-Wound Care Topical Primary Dressing Secondary Dressing Secured With Compression Wrap Compression Stockings Add-Ons Electronic Signature(s) Signed: 11/01/2020 6:16:29 PM By: Deon Pilling Entered By: Deon Pilling on 11/01/2020 10:03:10 -------------------------------------------------------------------------------- Wound Assessment Details Patient Name: Date of Service: [name] [name] [facility] F. 11/01/2020 9:30 A M Medical Record Number: [id number] Patient Account Number: [id number] Date of Birth/Sex: Treating RN: 05/29/1945 (75 y.o. [name] [name] Primary Care Shakeeta Godette: [name], [name] Other Clinician: Referring Hamdi Kley: Treating Voshon Petro/Extender: [name], [name] [name], [name] Weeks in Treatment: 237 Wound Status Wound Number: 36 Primary Etiology: Venous Leg Ulcer Wound Location: Left, Lateral Foot Wound Status: Amputation Wounding Event: Gradually Appeared Outcome Level: Above Knee Date Acquired: 07/19/2020 Weeks Of Treatment: 14 Clustered Wound: No Wound Measurements Length: (cm) Width: (cm) Depth: (cm) Area: (cm) Volume: (cm) 0 % Reduction in Area: 100% 0 % Reduction in Volume: 100% 0 0 0 Wound Description Classification: Full Thickness With Exposed Support Structures Treatment Notes Wound #36 (Foot) Wound Laterality: Left, Lateral Cleanser Peri-Wound Care Topical Primary Dressing Secondary Dressing Secured With Compression Wrap Compression Stockings Add-Ons Electronic Signature(s) Signed: 11/01/2020 6:16:29 PM By: Deon Pilling Entered By: Deon Pilling on 11/01/2020 10:03:10 -------------------------------------------------------------------------------- Wound Assessment Details Patient Name: Date of Service: [name] [name] [facility] F. 11/01/2020 9:30 A M Medical Record Number: [id number] Patient Account Number: [id number] Date of Birth/Sex: Treating RN: 04-01-1946  (75 y.o. [name] [name] Primary Care Lameeka Schleifer: [name], [name] Other Clinician: Referring Anacarolina Evelyn: Treating Jnae Thomaston/Extender: [name], [name] [name], [name] Weeks in Treatment: 237 Wound Status Wound Number: 38 Primary Abrasion Etiology: Wound Location: Right Knee Wound Open Wounding Event: Bump Status: Date Acquired: 10/30/2020 Comorbid Anemia, Sleep Apnea, Arrhythmia, Congestive Heart Failure, Weeks Of Treatment: 0 History: Coronary
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
## 2020-11-03 DIAGNOSIS — E1142 Type 2 diabetes mellitus with diabetic polyneuropathy: Secondary | ICD-10-CM | POA: Diagnosis not present

## 2020-11-03 DIAGNOSIS — E1122 Type 2 diabetes mellitus with diabetic chronic kidney disease: Secondary | ICD-10-CM | POA: Diagnosis not present

## 2020-11-03 DIAGNOSIS — Z89612 Acquired absence of left leg above knee: Secondary | ICD-10-CM | POA: Diagnosis not present

## 2020-11-03 DIAGNOSIS — Z4781 Encounter for orthopedic aftercare following surgical amputation: Secondary | ICD-10-CM | POA: Diagnosis not present

## 2020-11-03 DIAGNOSIS — I2510 Atherosclerotic heart disease of native coronary artery without angina pectoris: Secondary | ICD-10-CM | POA: Diagnosis not present

## 2020-11-03 DIAGNOSIS — I69351 Hemiplegia and hemiparesis following cerebral infarction affecting right dominant side: Secondary | ICD-10-CM | POA: Diagnosis not present

## 2020-11-03 DIAGNOSIS — I130 Hypertensive heart and chronic kidney disease with heart failure and stage 1 through stage 4 chronic kidney disease, or unspecified chronic kidney disease: Secondary | ICD-10-CM | POA: Diagnosis not present

## 2020-11-03 DIAGNOSIS — I5042 Chronic combined systolic (congestive) and diastolic (congestive) heart failure: Secondary | ICD-10-CM | POA: Diagnosis not present

## 2020-11-03 DIAGNOSIS — N184 Chronic kidney disease, stage 4 (severe): Secondary | ICD-10-CM | POA: Diagnosis not present

## 2020-11-07 ENCOUNTER — Telehealth: Payer: Self-pay | Admitting: Family Medicine

## 2020-11-07 DIAGNOSIS — I5042 Chronic combined systolic (congestive) and diastolic (congestive) heart failure: Secondary | ICD-10-CM | POA: Diagnosis not present

## 2020-11-07 DIAGNOSIS — Z89612 Acquired absence of left leg above knee: Secondary | ICD-10-CM | POA: Diagnosis not present

## 2020-11-07 DIAGNOSIS — R399 Unspecified symptoms and signs involving the genitourinary system: Secondary | ICD-10-CM | POA: Diagnosis not present

## 2020-11-07 DIAGNOSIS — N184 Chronic kidney disease, stage 4 (severe): Secondary | ICD-10-CM | POA: Diagnosis not present

## 2020-11-07 DIAGNOSIS — I130 Hypertensive heart and chronic kidney disease with heart failure and stage 1 through stage 4 chronic kidney disease, or unspecified chronic kidney disease: Secondary | ICD-10-CM | POA: Diagnosis not present

## 2020-11-07 DIAGNOSIS — I69351 Hemiplegia and hemiparesis following cerebral infarction affecting right dominant side: Secondary | ICD-10-CM | POA: Diagnosis not present

## 2020-11-07 DIAGNOSIS — N390 Urinary tract infection, site not specified: Secondary | ICD-10-CM | POA: Diagnosis not present

## 2020-11-07 DIAGNOSIS — Z4781 Encounter for orthopedic aftercare following surgical amputation: Secondary | ICD-10-CM | POA: Diagnosis not present

## 2020-11-07 DIAGNOSIS — E1122 Type 2 diabetes mellitus with diabetic chronic kidney disease: Secondary | ICD-10-CM | POA: Diagnosis not present

## 2020-11-07 DIAGNOSIS — E1142 Type 2 diabetes mellitus with diabetic polyneuropathy: Secondary | ICD-10-CM | POA: Diagnosis not present

## 2020-11-07 DIAGNOSIS — I2510 Atherosclerotic heart disease of native coronary artery without angina pectoris: Secondary | ICD-10-CM | POA: Diagnosis not present

## 2020-11-07 NOTE — Telephone Encounter (Signed)
Pt has an appointment on Wednesday.

## 2020-11-07 NOTE — Telephone Encounter (Signed)
Shanon Brow w/[facility] is call for a extension of [facility] for 2  week 3.  May leave a detail msg on his secured voice mail.

## 2020-11-08 ENCOUNTER — Ambulatory Visit (INDEPENDENT_AMBULATORY_CARE_PROVIDER_SITE_OTHER): Payer: Medicare HMO | Admitting: Physician Assistant

## 2020-11-08 ENCOUNTER — Other Ambulatory Visit: Payer: Self-pay

## 2020-11-08 VITALS — BP 88/56 | HR 68 | Temp 97.0°F | Resp 14 | Ht 73.0 in

## 2020-11-08 DIAGNOSIS — R6889 Other general symptoms and signs: Secondary | ICD-10-CM | POA: Diagnosis not present

## 2020-11-08 DIAGNOSIS — I739 Peripheral vascular disease, unspecified: Secondary | ICD-10-CM

## 2020-11-08 NOTE — Progress Notes (Signed)
POST OPERATIVE OFFICE NOTE    CC:  F/u for surgery  HPI:  This is a 75 y.o. male who is s/p left AKA on 10/10/2020 by Dr. Carlis Abbott.    Ultimately he had abnormal ABIs.  He underwent arteriogram on 06/23/20 in which he had inline flow down the left lower extremity through peroneal artery (with underlying tibial disease).    He is followed by Dr. Dellia Nims at the wound clinic.  Recent CT of the foot does show underlying osteomyelitis.  He has been seen by Dr. [name] with ID who feels patient needs amputation.  Apparently these wounds have been under wound care for almost 300 weeks according to the daughter.  Dr. Haroldine Laws also has concerns about the risk of his foot wounds seeding his defibrillator and pacemaker.  He is non-ambulatory and transfer with assistance from daughter.  On aortogram, the RLE was limited but did appear to have a flush SFA occlusion.  Pt returns today for follow up with his daughter.  Pt states he does not really have any pain and his daughter states he did not take any pain medication.  She is concerned about the right leg as it has wounds present that is being followed by the wound center and he has [facility] changing dressing eery other day.  His daughter is concerned about the wounds on the right leg.   Allergies  Allergen Reactions   Tape Other (See Comments)    SKIN IS VERY FRAGILE- WILL TEAR AND BRUISE EASILY!!   Sulfa Antibiotics Rash    Current Outpatient Medications  Medication Sig Dispense Refill   Alcohol Swabs (B-D SINGLE USE SWABS REGULAR) PADS USE TO TEST BLOOD SUGAR DAILY 100 each 3   amiodarone (PACERONE) 200 MG tablet TAKE 1 TABLET EVERY DAY 90 tablet 3   aspirin EC 81 MG tablet Take 81 mg by mouth daily.     Blood Glucose Calibration (TRUE METRIX LEVEL 1) Low SOLN USE TO TEST BLOOD SUGAR DAILY 1 each 3   Blood Glucose Monitoring Suppl (TRUE METRIX AIR GLUCOSE METER) w/Device KIT 1 Device by Does not apply route daily. 1 kit 1   Blood Pressure Monitoring (BLOOD  PRESSURE MONITOR AUTOMAT) DEVI To check BP daily 1 Device 0   carvedilol (COREG) 3.125 MG tablet TAKE 1 TABLET (3.125 MG TOTAL) BY MOUTH 2 (TWO) TIMES DAILY WITH A MEAL. MUST KEEP PENDING APPT FOR FURTHER REFILLS 180 tablet 3   clopidogrel (PLAVIX) 75 MG tablet TAKE 1 TABLET EVERY DAY 90 tablet 3   clotrimazole-betamethasone (LOTRISONE) cream Apply 1 application topically daily as needed. mall amount. 30 g 1   finasteride (PROSCAR) 5 MG tablet TAKE 1 TABLET EVERY DAY 90 tablet 1   HYDROcodone-acetaminophen (NORCO/VICODIN) 5-325 MG tablet Take 1 tablet by mouth every 4 (four) hours as needed for moderate pain.     insulin aspart (NOVOLOG) 100 UNIT/ML injection Inject 3 Units into the skin 3 (three) times daily with meals.     insulin glargine (LANTUS SOLOSTAR) 100 UNIT/ML Solostar Pen Inject 10 Units into the skin at bedtime.     losartan (COZAAR) 25 MG tablet Take 0.5 tablets (12.5 mg total) by mouth daily.     Magnesium 500 MG TABS Take 500 mg by mouth every other day. In the morning     mirtazapine (REMERON) 15 MG tablet TAKE 1 TABLET (15 MG TOTAL) BY MOUTH AT BEDTIME. 90 tablet 2   Multiple Vitamin (MULTIVITAMIN WITH MINERALS) TABS tablet Take 1 tablet  by mouth daily. One-A-Day     nystatin (MYCOSTATIN/NYSTOP) powder Apply topically 3 (three) times daily. (Patient taking differently: Apply 1 application topically 3 (three) times daily as needed (irritation).) 60 g 2   rosuvastatin (CRESTOR) 40 MG tablet TAKE 1 TABLET EVERY DAY 90 tablet 3   SANTYL ointment Apply 1 application topically as needed (for wound care).     SILVER NITRATE EX Apply 1 application topically daily as needed (wound care).     tamsulosin (FLOMAX) 0.4 MG CAPS capsule TAKE 1 CAPSULE EVERY DAY 90 capsule 3   torsemide (DEMADEX) 20 MG tablet TAKE 1 TABLET EVERY OTHER DAY 45 tablet 2   TRUE METRIX BLOOD GLUCOSE TEST test strip USE  AS  INSTRUCTED 100 strip 3   TRUEplus Lancets 30G MISC USE  TO  TEST BLOOD SUGAR EVERY DAY  (Patient taking differently: USE  TO  TEST BLOOD SUGAR EVERY DAY) 100 each 5   Vitamin A 3 MG (10000 UT) TABS Take 10,000 mcg by mouth daily.     vitamin C (ASCORBIC ACID) 500 MG tablet Take 500 mg by mouth daily.     zinc gluconate 50 MG tablet Take 50 mg by mouth daily.     No current facility-administered medications for this visit.     ROS:  See HPI  Physical Exam:  Today's Vitals   11/08/20 0926 11/08/20 0930  BP: (!) 88/56   Pulse: 68   Resp: 14   Temp: (!) 97 F (36.1 C)   TempSrc: Temporal   SpO2: 98%   Height: 6' 1"  (1.854 m)   PainSc: 0-No pain 0-No pain   Body mass index is 25.89 kg/m.   Incision:  left AKA incision has healed nicely.   Extremities:  right leg bandaged.     Assessment/Plan:  This is a 75 y.o. male who is s/p: Left AKA 10/10/2020 by Dr. Carlis Abbott  -pt's AKA wound has healed and staples were removed today and he tolerated this well. -pt seen with Dr. Carlis Abbott and discussion had about the right leg.  He reviewed the arteriogram, which was limited.  He discussed proceeding with arteriogram to evaluate right leg blood flow as his toe pressure on the right was 50.  His daughter is in agreement with this plan and will schedule for a couple of weeks.     Leontine Locket, [facility] [phone]   Clinic MD:  pt seen with Dr. Carlis Abbott

## 2020-11-09 ENCOUNTER — Ambulatory Visit (INDEPENDENT_AMBULATORY_CARE_PROVIDER_SITE_OTHER): Payer: Medicare HMO | Admitting: Family Medicine

## 2020-11-09 ENCOUNTER — Telehealth: Payer: Self-pay | Admitting: Family Medicine

## 2020-11-09 ENCOUNTER — Encounter: Payer: Self-pay | Admitting: Family Medicine

## 2020-11-09 VITALS — BP 92/60 | HR 83 | Temp 98.4°F | Ht 73.0 in

## 2020-11-09 DIAGNOSIS — N1832 Chronic kidney disease, stage 3b: Secondary | ICD-10-CM

## 2020-11-09 DIAGNOSIS — I130 Hypertensive heart and chronic kidney disease with heart failure and stage 1 through stage 4 chronic kidney disease, or unspecified chronic kidney disease: Secondary | ICD-10-CM | POA: Diagnosis not present

## 2020-11-09 DIAGNOSIS — Z89612 Acquired absence of left leg above knee: Secondary | ICD-10-CM | POA: Diagnosis not present

## 2020-11-09 DIAGNOSIS — E1121 Type 2 diabetes mellitus with diabetic nephropathy: Secondary | ICD-10-CM | POA: Diagnosis not present

## 2020-11-09 DIAGNOSIS — Z794 Long term (current) use of insulin: Secondary | ICD-10-CM | POA: Diagnosis not present

## 2020-11-09 DIAGNOSIS — I472 Ventricular tachycardia, unspecified: Secondary | ICD-10-CM

## 2020-11-09 DIAGNOSIS — Z978 Presence of other specified devices: Secondary | ICD-10-CM

## 2020-11-09 DIAGNOSIS — E785 Hyperlipidemia, unspecified: Secondary | ICD-10-CM

## 2020-11-09 LAB — BASIC METABOLIC PANEL
BUN: 28 mg/dL — ABNORMAL HIGH (ref 6–23)
CO2: 27 mEq/L (ref 19–32)
Calcium: 7.7 mg/dL — ABNORMAL LOW (ref 8.4–10.5)
Chloride: 108 mEq/L (ref 96–112)
Creatinine, Ser: 1.81 mg/dL — ABNORMAL HIGH (ref 0.40–1.50)
GFR: 36.42 mL/min — ABNORMAL LOW (ref >60.00)
Glucose, Bld: 150 mg/dL — ABNORMAL HIGH (ref 70–99)
Potassium: 4.1 mEq/L (ref 3.5–5.1)
Sodium: 143 mEq/L (ref 135–145)

## 2020-11-09 NOTE — Patient Instructions (Addendum)
A few things to remember from today's visit:  Type 2 diabetes mellitus with diabetic nephropathy, with long-term current use of insulin ([place]) - Plan: Basic metabolic panel, Fructosamine  Hyperlipidemia, unspecified hyperlipidemia type  Hypertension with congestive heart failure and renal failure ([place]) - Plan: Basic metabolic panel  If you need refills please call your pharmacy. Do not use My Chart to request refills or for acute issues that need immediate attention.  No changes in Lantus. Decrease Novolog from 10 U to 3 U with meals. Continue monitoring blood sugars. We need an HgA1C in 4 months. I am not changing blood pressure meds, follow cardio instructions.  Please be sure medication list is accurate. If a new problem present, please set up appointment sooner than planned today.

## 2020-11-09 NOTE — Telephone Encounter (Signed)
Verbal left on David's voicemail.

## 2020-11-09 NOTE — Progress Notes (Signed)
Chief Complaint  Patient presents with   Follow-up   HPI: [name] is a 75 y.o. male with hx if  CVA with right-sided paresis and wheelchair-bound, chronic left leg ulcer, chronic mild systolic and diastolic CHF,(LVEF 15 to 11%) status post ICD, multivessel CAD, NSVT, CKD stage IV, chronic indwelling Foley catheter, IDDM, HTN, BPH who is here today for follow up.   He was last seen on 06/24/19 Since his last visit he has followed with cardiologist,urologist,neurologist,wound clinic,and vascular. Hospitalized from 6/20 to 10/13/20. Discharged home, he declined SNF (per notes). According to daughter, he was discharged home at 3 am with no instructions.  PAD with unhealing feet ulcers for years. He was admitted for elective left AKA due to recurrent infection (chronic osteomyelitis) and worsening PAD. Underwent left AKA on 6/57/90, no complications. Following with wound clinic still for right foot ulcers. Pending RLE angiography. Has seen his vascular after hospital discharge,last seen yesterday.  He is having PT 2 times per week and it is helping with RLE straight.  2015 CVA with residual dysphasia and RLE weakness. He has not been ambulatory for 3-4 years. LE's EMG November 2017 right leg which showed severe right femoral neuropathy, multilevel lumbosacral radiculopathy, and bilateral sensorimotor polyneuropathy.  Followed with Dr Posey Pronto.  Had OT until early this week.  DM II: He is on Lantus 10 U daily and Novalog 10 U TID before meals. Noted that Novalog was decreased at the time of hospital discharge, daughter was not aware. Hypoglycemic events, glucose 29 x 2, sugar helped x 1, EMS called x 1 and treated with IV dextrose. BS's low 100's. Lab Results  Component Value Date   HGBA1C 6.0 (H) 10/10/2020   HLD: On Crestor 40 mg daily.  Lab Results  Component Value Date   CHOL 120 03/09/2014   HDL 39 (L) 03/09/2014   LDLCALC 56 03/09/2014   LDLDIRECT 100.5  02/08/2012   TRIG 123 03/09/2014   CHOLHDL 3.1 03/09/2014    HTN: Home BP's low at 80-90/60, asymptomatic. According to daughter,his cardiologist is aware of readings but as far as he is not having hypotension like symptoms, medications unchanged. CKD IIIb: Following with nephrologist annually.  HFrEF on Carvedilol 3.125 mg bid and Losartan 25 mg 1/2 tab daily. Torsemide 20 mg every other day. Hx of ventricular tachycardia on Amiodarone 200 mg daily.  Echo 07/2020 showed LVEF 20-25%. ICD placement, in 1-2 years battery will need to be changed.  Flaccid neuropathic bladder: Usually he goes to his urologist's office monthly to have urine cath changed (3 days ago)  last time it was changed by [facility] nurse because he was due to do so and urine was cloudy/odorous.. Urine sample was taken for UA and Ucx. [place] nurse called her daughter and told her he has a UTI. According to daughter results were faxed to his urologist and waiting for recommendations. He denies abdominal pain. Negative for fever,changes in appetite,or MS changes. BPH on Flomax 0.4 mg daily and Proscar 5 mg daily.  Review of Systems  Constitutional:  Positive for activity change and fatigue.  HENT:  Negative for mouth sores, nosebleeds and sore throat.   Eyes:  Negative for redness and visual disturbance.  Respiratory:  Negative for cough, shortness of breath and wheezing.   Cardiovascular:  Negative for chest pain, palpitations and leg swelling.  Gastrointestinal:  Negative for nausea and vomiting.  Endocrine: Negative for polydipsia, polyphagia and polyuria.  Genitourinary:  Negative for decreased urine volume  and hematuria.  Neurological:  Negative for syncope and headaches.  Psychiatric/Behavioral:  Negative for confusion and hallucinations.   Rest of ROS, see pertinent positives sand negatives in HPI  Current Outpatient Medications on File Prior to Visit  Medication Sig Dispense Refill   Alcohol Swabs (B-D SINGLE USE SWABS  REGULAR) PADS USE TO TEST BLOOD SUGAR DAILY 100 each 3   amiodarone (PACERONE) 200 MG tablet TAKE 1 TABLET EVERY DAY 90 tablet 3   aspirin EC 81 MG tablet Take 81 mg by mouth daily.     Blood Glucose Calibration (TRUE METRIX LEVEL 1) Low SOLN USE TO TEST BLOOD SUGAR DAILY 1 each 3   Blood Glucose Monitoring Suppl (TRUE METRIX AIR GLUCOSE METER) w/Device KIT 1 Device by Does not apply route daily. 1 kit 1   Blood Pressure Monitoring (BLOOD PRESSURE MONITOR AUTOMAT) DEVI To check BP daily 1 Device 0   carvedilol (COREG) 3.125 MG tablet TAKE 1 TABLET (3.125 MG TOTAL) BY MOUTH 2 (TWO) TIMES DAILY WITH A MEAL. MUST KEEP PENDING APPT FOR FURTHER REFILLS 180 tablet 3   clopidogrel (PLAVIX) 75 MG tablet TAKE 1 TABLET EVERY DAY 90 tablet 3   clotrimazole-betamethasone (LOTRISONE) cream Apply 1 application topically daily as needed. mall amount. 30 g 1   finasteride (PROSCAR) 5 MG tablet TAKE 1 TABLET EVERY DAY 90 tablet 1   HYDROcodone-acetaminophen (NORCO/VICODIN) 5-325 MG tablet Take 1 tablet by mouth every 4 (four) hours as needed for moderate pain.     insulin aspart (NOVOLOG) 100 UNIT/ML injection Inject 3 Units into the skin 3 (three) times daily with meals.     insulin glargine (LANTUS SOLOSTAR) 100 UNIT/ML Solostar Pen Inject 10 Units into the skin at bedtime.     losartan (COZAAR) 25 MG tablet Take 0.5 tablets (12.5 mg total) by mouth daily.     Magnesium 500 MG TABS Take 500 mg by mouth every other day. In the morning     mirtazapine (REMERON) 15 MG tablet TAKE 1 TABLET (15 MG TOTAL) BY MOUTH AT BEDTIME. 90 tablet 2   Multiple Vitamin (MULTIVITAMIN WITH MINERALS) TABS tablet Take 1 tablet by mouth daily. One-A-Day     nystatin (MYCOSTATIN/NYSTOP) powder Apply topically 3 (three) times daily. (Patient taking differently: Apply 1 application topically 3 (three) times daily as needed (irritation).) 60 g 2   rosuvastatin (CRESTOR) 40 MG tablet TAKE 1 TABLET EVERY DAY 90 tablet 3   SANTYL ointment  Apply 1 application topically as needed (for wound care).     SILVER NITRATE EX Apply 1 application topically daily as needed (wound care).     tamsulosin (FLOMAX) 0.4 MG CAPS capsule TAKE 1 CAPSULE EVERY DAY 90 capsule 3   torsemide (DEMADEX) 20 MG tablet TAKE 1 TABLET EVERY OTHER DAY 45 tablet 2   TRUE METRIX BLOOD GLUCOSE TEST test strip USE  AS  INSTRUCTED 100 strip 3   TRUEplus Lancets 30G MISC USE  TO  TEST BLOOD SUGAR EVERY DAY (Patient taking differently: USE  TO  TEST BLOOD SUGAR EVERY DAY) 100 each 5   Vitamin A 3 MG (10000 UT) TABS Take 10,000 mcg by mouth daily.     vitamin C (ASCORBIC ACID) 500 MG tablet Take 500 mg by mouth daily.     zinc gluconate 50 MG tablet Take 50 mg by mouth daily.     No current facility-administered medications on file prior to visit.     Past Medical History:  Diagnosis Date  Automatic implantable cardioverter-defibrillator in situ    CAD (coronary artery disease)    a. LHC (08/2005):  Ostial LAD 99%, mid LAD 50%, superior D1 80%, inferior D1 75%, mid OM proximal 80%, proximal PDA 25-30%, mid RCA 99%.   MRI with full thickness scar.  Med Rx.  2015 demonstrated similar diffuse three-vessel coronary disease. Perfusion imaging with rest we distribution demonstrated no viability in the area of the LAD and circumflex. Medical management.   CAD, multiple vessel, RCA 100% mid occl.; LAD 99% stenosisprox.  mid of 50-60%; LCX OM-1 90%, OM-2 99%,AV groove 80%  12/10/2013   CHF (congestive heart failure) (HCC)    Chronic combined systolic and diastolic heart failure (HCC)    Chronic osteomyelitis of toe, left ([place]) 09/01/2020   CKD (chronic kidney disease)    Dr [name] PCP monitors   Coronary artery disease    Enterococcus faecalis infection 09/01/2020   Gout    History of kidney stones    passed   Hx of echocardiogram 2015   Echo (08/2013):  EF 10-15%, diff HK, mod LAE, severe RVE, mod reduced RVSF, mild RAE, PASP 41 mmHg.   Hypertension    10/06/20-  blood pressures run low at this time.   Ischemic cardiomyopathy    Echo (8/11):  EF 40-45%;  Echo (5/15):  EF 10-15%   MRSA infection 09/01/2020   Myocardial infarction [facility]) 2007   out of hosp MI - LHC with 3v CAD rx medically, 2017   NSVT (nonsustained ventricular tachycardia) ([place])    s/p ICD   Paralysis ([place])    Left Leg- has a Neurogenic bladder- has an indewelling catherter.   Peripheral neuropathy    Polymicrobial bacterial infection 09/01/2020   Sleep apnea    "has CPAP; won't use it"   Stroke [facility]) ~ 2013   "right leg weak since" (12/09/2013)   Type II diabetes mellitus ([place]) dx'd 2005   Allergies  Allergen Reactions   Tape Other (See Comments)    SKIN IS VERY FRAGILE- WILL TEAR AND BRUISE EASILY!!   Sulfa Antibiotics Rash    Social History   Socioeconomic History   Marital status: Married    Spouse name: Not on file   Number of children: Not on file   Years of education: Not on file   Highest education level: Not on file  Occupational History   Occupation: retired  Tobacco Use   Smoking status: Never   Smokeless tobacco: Never  Vaping Use   Vaping Use: Never used  Substance and Sexual Activity   Alcohol use: No   Drug use: No   Sexual activity: Not on file  Other Topics Concern   Not on file  Social History Narrative   ** Merged History Encounter **       He is retired Veterinary surgeon for [employer].  He took early retirement at 75 years old due to medical reasons. He lives with wife. Highest level of education:  High school    Social Determinants of Health   Financial Resource Strain: Not on file  Food Insecurity: Not on file  Transportation Needs: Not on file  Physical Activity: Not on file  Stress: Not on file  Social Connections: Not on file    Vitals:   11/09/20 0912  BP: 92/60  Pulse: 83  Temp: 98.4 F (36.9 C)  SpO2: 99%   Body mass index is 25.89 kg/m.   Physical Exam Vitals and nursing note reviewed.  Constitutional:      General: He is not in acute distress.    Appearance: He is well-developed.  HENT:     Head: Normocephalic and atraumatic.     Mouth/Throat:     Mouth: Mucous membranes are moist.     Pharynx: Oropharynx is clear.  Eyes:     Conjunctiva/sclera: Conjunctivae normal.  Cardiovascular:     Rate and Rhythm: Normal rate and regular rhythm.     Heart sounds: Murmur (SEM II/VI LUSB and RUSB) heard.  Pulmonary:     Effort: Pulmonary effort is normal. No respiratory distress.     Breath sounds: Normal breath sounds.  Abdominal:     Palpations: Abdomen is soft. There is no hepatomegaly or mass.     Tenderness: There is no abdominal tenderness.  Musculoskeletal:     Left Lower Extremity: Left leg is amputated above knee.  Lymphadenopathy:     Cervical: No cervical adenopathy.  Skin:    General: Skin is warm.     Findings: No erythema or rash.     Comments: Right foot wounds wrapped with bandage.  Neurological:     Mental Status: He is alert and oriented to person, place, and time.     Comments: He is in a wheel chair.  Psychiatric:     Comments: Well groomed, good eye contact.   ASSESSMENT AND PLAN:  Mr. ANDRES VEST was seen today for follow-up.  Diagnoses and all orders for this visit: Orders Placed This Encounter  Procedures   Basic metabolic panel   Fructosamine   Lab Results  Component Value Date   CREATININE 1.81 (H) 11/09/2020   BUN 28 (H) 11/09/2020   NA 143 11/09/2020   K 4.1 11/09/2020   CL 108 11/09/2020   CO2 27 11/09/2020   Type 2 diabetes mellitus with diabetic nephropathy, with long-term current use of insulin ([place]) With episodes of hypoglycemia. Goal HgA1C < 8.0. Decrease Novolog from 10 U to 3 U TID, if BS's < 100 frequently, we will consider stopping it. Continue Lantus 10 U daily. Continue monitoring BS's.  Hyperlipidemia, unspecified hyperlipidemia type Continue Crestor 40 mg daily. LDL at goal, 56.  Hypertension with  congestive heart failure and renal failure ([place]) Mildly hypotense, asymptomatic. Continue monitoring BP regularly. Instructed about warning signs. HFrEF: Continue Losartan and Carvedilol, both at low dose. Continue following with cardiologist.  Stage 3b chronic kidney disease ([place]) Long hx of DM II and HTN. Cr 1.7 and eGFR 40. Hypotension could aggravate problem. Continue low salt diet,adequate glucose controlled,and avoidance of NSAID's. Following with nephrologist.  Chronic indwelling Foley catheter I think [place] can continue changing cath, unless urologist recommend otherwise. Explained that urine could be odorous and cloudy if cath has not been changed in weeks. We will try to obtain copy of Ucx. Following with urologist.   Ventricular tachycardia ([place]) Problem is well controlled. On Amiodarone 200 mg daily. Following with cardio.  S/P AKA (above knee amputation) unilateral, left ([place]) Healing well. Following with vascular. RLE chronic ulcers, pending angiography.  I spent a total of 41 minutes in both face to face and non face to face activities for this visit on the date of this encounter. During this time history was obtained and documented, examination was performed, prior labs/imaging reviewed, and assessment/plan discussed.  Return in about 5 months (around 04/11/2021).   [name] Maniscalco G. [name], MD  [facility]. [place] office.

## 2020-11-09 NOTE — Telephone Encounter (Signed)
Verbal given 

## 2020-11-09 NOTE — Telephone Encounter (Signed)
Beth from [facility] called needing verbal orders for patient after being seen by Dr. [name] this morning. She says that she called last week about verbal orders but patient needed to be seen first.  Beth's contact number is [phone].

## 2020-11-10 DIAGNOSIS — Z89612 Acquired absence of left leg above knee: Secondary | ICD-10-CM | POA: Diagnosis not present

## 2020-11-10 DIAGNOSIS — Z4781 Encounter for orthopedic aftercare following surgical amputation: Secondary | ICD-10-CM | POA: Diagnosis not present

## 2020-11-10 DIAGNOSIS — N184 Chronic kidney disease, stage 4 (severe): Secondary | ICD-10-CM | POA: Diagnosis not present

## 2020-11-10 DIAGNOSIS — I69351 Hemiplegia and hemiparesis following cerebral infarction affecting right dominant side: Secondary | ICD-10-CM | POA: Diagnosis not present

## 2020-11-10 DIAGNOSIS — E1122 Type 2 diabetes mellitus with diabetic chronic kidney disease: Secondary | ICD-10-CM | POA: Diagnosis not present

## 2020-11-10 DIAGNOSIS — I130 Hypertensive heart and chronic kidney disease with heart failure and stage 1 through stage 4 chronic kidney disease, or unspecified chronic kidney disease: Secondary | ICD-10-CM | POA: Diagnosis not present

## 2020-11-10 DIAGNOSIS — I5042 Chronic combined systolic (congestive) and diastolic (congestive) heart failure: Secondary | ICD-10-CM | POA: Diagnosis not present

## 2020-11-10 DIAGNOSIS — E1142 Type 2 diabetes mellitus with diabetic polyneuropathy: Secondary | ICD-10-CM | POA: Diagnosis not present

## 2020-11-10 DIAGNOSIS — I2510 Atherosclerotic heart disease of native coronary artery without angina pectoris: Secondary | ICD-10-CM | POA: Diagnosis not present

## 2020-11-11 DIAGNOSIS — I130 Hypertensive heart and chronic kidney disease with heart failure and stage 1 through stage 4 chronic kidney disease, or unspecified chronic kidney disease: Secondary | ICD-10-CM | POA: Diagnosis not present

## 2020-11-11 DIAGNOSIS — E1142 Type 2 diabetes mellitus with diabetic polyneuropathy: Secondary | ICD-10-CM | POA: Diagnosis not present

## 2020-11-11 DIAGNOSIS — I5042 Chronic combined systolic (congestive) and diastolic (congestive) heart failure: Secondary | ICD-10-CM | POA: Diagnosis not present

## 2020-11-11 DIAGNOSIS — N184 Chronic kidney disease, stage 4 (severe): Secondary | ICD-10-CM | POA: Diagnosis not present

## 2020-11-11 DIAGNOSIS — E1122 Type 2 diabetes mellitus with diabetic chronic kidney disease: Secondary | ICD-10-CM | POA: Diagnosis not present

## 2020-11-11 DIAGNOSIS — I69351 Hemiplegia and hemiparesis following cerebral infarction affecting right dominant side: Secondary | ICD-10-CM | POA: Diagnosis not present

## 2020-11-11 DIAGNOSIS — Z89612 Acquired absence of left leg above knee: Secondary | ICD-10-CM | POA: Diagnosis not present

## 2020-11-11 DIAGNOSIS — I2510 Atherosclerotic heart disease of native coronary artery without angina pectoris: Secondary | ICD-10-CM | POA: Diagnosis not present

## 2020-11-11 DIAGNOSIS — Z4781 Encounter for orthopedic aftercare following surgical amputation: Secondary | ICD-10-CM | POA: Diagnosis not present

## 2020-11-12 LAB — FRUCTOSAMINE: Fructosamine: 219 umol/L (ref 205–285)

## 2020-11-14 ENCOUNTER — Other Ambulatory Visit: Payer: Self-pay | Admitting: Family Medicine

## 2020-11-14 DIAGNOSIS — I130 Hypertensive heart and chronic kidney disease with heart failure and stage 1 through stage 4 chronic kidney disease, or unspecified chronic kidney disease: Secondary | ICD-10-CM

## 2020-11-15 ENCOUNTER — Other Ambulatory Visit: Payer: Self-pay

## 2020-11-15 ENCOUNTER — Encounter (HOSPITAL_BASED_OUTPATIENT_CLINIC_OR_DEPARTMENT_OTHER): Payer: Medicare HMO | Admitting: Internal Medicine

## 2020-11-15 DIAGNOSIS — L97218 Non-pressure chronic ulcer of right calf with other specified severity: Secondary | ICD-10-CM | POA: Diagnosis not present

## 2020-11-15 DIAGNOSIS — L97812 Non-pressure chronic ulcer of other part of right lower leg with fat layer exposed: Secondary | ICD-10-CM | POA: Diagnosis not present

## 2020-11-15 DIAGNOSIS — I130 Hypertensive heart and chronic kidney disease with heart failure and stage 1 through stage 4 chronic kidney disease, or unspecified chronic kidney disease: Secondary | ICD-10-CM | POA: Diagnosis not present

## 2020-11-15 DIAGNOSIS — E11622 Type 2 diabetes mellitus with other skin ulcer: Secondary | ICD-10-CM | POA: Diagnosis not present

## 2020-11-15 DIAGNOSIS — E1122 Type 2 diabetes mellitus with diabetic chronic kidney disease: Secondary | ICD-10-CM | POA: Diagnosis not present

## 2020-11-15 DIAGNOSIS — Z89612 Acquired absence of left leg above knee: Secondary | ICD-10-CM | POA: Diagnosis not present

## 2020-11-15 DIAGNOSIS — I872 Venous insufficiency (chronic) (peripheral): Secondary | ICD-10-CM | POA: Diagnosis not present

## 2020-11-15 DIAGNOSIS — S80211A Abrasion, right knee, initial encounter: Secondary | ICD-10-CM | POA: Diagnosis not present

## 2020-11-15 DIAGNOSIS — I255 Ischemic cardiomyopathy: Secondary | ICD-10-CM | POA: Diagnosis not present

## 2020-11-15 DIAGNOSIS — L97518 Non-pressure chronic ulcer of other part of right foot with other specified severity: Secondary | ICD-10-CM | POA: Diagnosis not present

## 2020-11-15 DIAGNOSIS — I5042 Chronic combined systolic (congestive) and diastolic (congestive) heart failure: Secondary | ICD-10-CM | POA: Diagnosis not present

## 2020-11-15 DIAGNOSIS — L89610 Pressure ulcer of right heel, unstageable: Secondary | ICD-10-CM | POA: Diagnosis not present

## 2020-11-15 NOTE — Progress Notes (Signed)
4x4 in [facility]) (Generic) Every Other Day/15 Days Discharge Instructions: Apply over primary dressing as directed. Secondary Dressing: ABD Pad, 5x9 (Home Health) (Generic) Every Other Day/15 Days Discharge Instructions: Apply over primary dressing as directed. Compression Wrap: Kerlix Roll 4.5x3.1 (in/yd) (Home Health) (Generic) Every Other Day/15 Days Discharge Instructions: Apply Kerlix and Coban compression as  directed. Compression Wrap: Coban Self-Adherent Wrap 4x5 (in/yd) (Home Health) (Generic) Every Other Day/15 Days Discharge Instructions: Apply over Kerlix as directed. Wound #39 - Calcaneus Wound Laterality: Right Cleanser: Wound Cleanser (Home Health) (Generic) Every Other Day/15 Days Discharge Instructions: Cleanse the wound with wound cleanser prior to applying a clean dressing using gauze sponges, not tissue or cotton balls. Cleanser: Soap and Water [facility]) Every Other Day/15 Days Discharge Instructions: May shower and wash wound with dial antibacterial soap and water prior to dressing change. Peri-Wound Care: Triamcinolone 15 (g) (Home Health) Every Other Day/15 Days Discharge Instructions: Use triamcinolone 15 (g) as directed Peri-Wound Care: Sween Lotion (Moisturizing lotion) (Home Health) Every Other Day/15 Days Discharge Instructions: Apply moisturizing lotion as directed Prim Dressing: Promogran Prisma Matrix, 4.34 (sq in) (silver collagen) (Home Health) Every Other Day/15 Days ary Discharge Instructions: Moisten collagen with saline or hydrogel Secondary Dressing: Woven Gauze Sponge, Non-Sterile 4x4 in (Home Health) (Generic) Every Other Day/15 Days Discharge Instructions: Apply over primary dressing as directed. Secondary Dressing: ABD Pad, 5x9 (Home Health) (Generic) Every Other Day/15 Days Discharge Instructions: Apply over primary dressing as directed. Compression Wrap: Kerlix Roll 4.5x3.1 (in/yd) (Home Health) (Generic) Every Other Day/15 Days Discharge Instructions: Apply Kerlix and Coban compression as directed. Compression Wrap: Coban Self-Adherent Wrap 4x5 (in/yd) (Home Health) (Generic) Every Other Day/15 Days Discharge Instructions: Apply over Kerlix as directed. Electronic Signature(s) Signed: 11/15/2020 5:43:33 PM By: Linton Ham MD Signed: 11/15/2020 5:55:16 PM By: [name] Entered By: [name] on 11/15/2020  11:01:42 -------------------------------------------------------------------------------- Problem List Details Patient Name: Date of Service: [name] Foley F. 11/15/2020 9:45 A M Medical Record Number: [id number] Patient Account Number: [id number] Date of Birth/Sex: Treating RN: [birth year] (75 y.o. [name], [name] Primary Care Provider: [name], [name] Other Clinician: Referring Provider: Treating Provider/Extender: Amiya Escamilla [name], [name] Weeks in Treatment: 239 Active Problems ICD-10 Encounter Code Description Active Date MDM Diagnosis E11.621 Type 2 diabetes mellitus with foot ulcer 04/17/2016 No Yes L97.518 Non-pressure chronic ulcer of other part of right foot with other specified 05/17/2020 No Yes severity L97.218 Non-pressure chronic ulcer of right calf with other specified severity 11/01/2020 No Yes S80.211D Abrasion, right knee, subsequent encounter 11/01/2020 No Yes L97.418 Non-pressure chronic ulcer of right heel and midfoot with other specified 11/15/2020 No Yes severity Inactive Problems ICD-10 Code Description Active Date Inactive Date L89.613 Pressure ulcer of right heel, stage 3 04/17/2016 04/17/2016 L97.221 Non-pressure chronic ulcer of left calf limited to breakdown of skin 08/15/2017 08/15/2017 L97.321 Non-pressure chronic ulcer of left ankle limited to breakdown of skin 07/01/2018 07/01/2018 L03.032 Cellulitis of left toe 01/06/2019 01/06/2019 N39.767 Pressure ulcer of left heel, stage 3 12/04/2016 12/04/2016 I25.119 Atherosclerotic heart disease of native coronary artery with unspecified angina 04/17/2016 04/17/2016 pectoris L97.821 Non-pressure chronic ulcer of other part of left lower leg limited to breakdown of skin 01/06/2019 01/06/2019 H41.93 Acute diastolic (congestive) heart failure 04/17/2016 04/17/2016 L97.521 Non-pressure chronic ulcer of other part of left foot limited to breakdown of skin 02/17/2019 02/17/2019 S51.811D Laceration without foreign  body of right forearm, subsequent encounter 10/22/2017 10/22/2017 L03.116 Cellulitis of left lower limb 12/24/2017 12/24/2017 L89.620 Pressure ulcer  4x4 in [facility]) (Generic) Every Other Day/15 Days Discharge Instructions: Apply over primary dressing as directed. Secondary Dressing: ABD Pad, 5x9 (Home Health) (Generic) Every Other Day/15 Days Discharge Instructions: Apply over primary dressing as directed. Compression Wrap: Kerlix Roll 4.5x3.1 (in/yd) (Home Health) (Generic) Every Other Day/15 Days Discharge Instructions: Apply Kerlix and Coban compression as  directed. Compression Wrap: Coban Self-Adherent Wrap 4x5 (in/yd) (Home Health) (Generic) Every Other Day/15 Days Discharge Instructions: Apply over Kerlix as directed. Wound #39 - Calcaneus Wound Laterality: Right Cleanser: Wound Cleanser (Home Health) (Generic) Every Other Day/15 Days Discharge Instructions: Cleanse the wound with wound cleanser prior to applying a clean dressing using gauze sponges, not tissue or cotton balls. Cleanser: Soap and Water [facility]) Every Other Day/15 Days Discharge Instructions: May shower and wash wound with dial antibacterial soap and water prior to dressing change. Peri-Wound Care: Triamcinolone 15 (g) (Home Health) Every Other Day/15 Days Discharge Instructions: Use triamcinolone 15 (g) as directed Peri-Wound Care: Sween Lotion (Moisturizing lotion) (Home Health) Every Other Day/15 Days Discharge Instructions: Apply moisturizing lotion as directed Prim Dressing: Promogran Prisma Matrix, 4.34 (sq in) (silver collagen) (Home Health) Every Other Day/15 Days ary Discharge Instructions: Moisten collagen with saline or hydrogel Secondary Dressing: Woven Gauze Sponge, Non-Sterile 4x4 in (Home Health) (Generic) Every Other Day/15 Days Discharge Instructions: Apply over primary dressing as directed. Secondary Dressing: ABD Pad, 5x9 (Home Health) (Generic) Every Other Day/15 Days Discharge Instructions: Apply over primary dressing as directed. Compression Wrap: Kerlix Roll 4.5x3.1 (in/yd) (Home Health) (Generic) Every Other Day/15 Days Discharge Instructions: Apply Kerlix and Coban compression as directed. Compression Wrap: Coban Self-Adherent Wrap 4x5 (in/yd) (Home Health) (Generic) Every Other Day/15 Days Discharge Instructions: Apply over Kerlix as directed. Electronic Signature(s) Signed: 11/15/2020 5:43:33 PM By: Linton Ham MD Signed: 11/15/2020 5:55:16 PM By: [name] Entered By: [name] on 11/15/2020  11:01:42 -------------------------------------------------------------------------------- Problem List Details Patient Name: Date of Service: [name] Foley F. 11/15/2020 9:45 A M Medical Record Number: [id number] Patient Account Number: [id number] Date of Birth/Sex: Treating RN: [birth year] (75 y.o. [name], [name] Primary Care Provider: [name], [name] Other Clinician: Referring Provider: Treating Provider/Extender: Amiya Escamilla [name], [name] Weeks in Treatment: 239 Active Problems ICD-10 Encounter Code Description Active Date MDM Diagnosis E11.621 Type 2 diabetes mellitus with foot ulcer 04/17/2016 No Yes L97.518 Non-pressure chronic ulcer of other part of right foot with other specified 05/17/2020 No Yes severity L97.218 Non-pressure chronic ulcer of right calf with other specified severity 11/01/2020 No Yes S80.211D Abrasion, right knee, subsequent encounter 11/01/2020 No Yes L97.418 Non-pressure chronic ulcer of right heel and midfoot with other specified 11/15/2020 No Yes severity Inactive Problems ICD-10 Code Description Active Date Inactive Date L89.613 Pressure ulcer of right heel, stage 3 04/17/2016 04/17/2016 L97.221 Non-pressure chronic ulcer of left calf limited to breakdown of skin 08/15/2017 08/15/2017 L97.321 Non-pressure chronic ulcer of left ankle limited to breakdown of skin 07/01/2018 07/01/2018 L03.032 Cellulitis of left toe 01/06/2019 01/06/2019 N39.767 Pressure ulcer of left heel, stage 3 12/04/2016 12/04/2016 I25.119 Atherosclerotic heart disease of native coronary artery with unspecified angina 04/17/2016 04/17/2016 pectoris L97.821 Non-pressure chronic ulcer of other part of left lower leg limited to breakdown of skin 01/06/2019 01/06/2019 H41.93 Acute diastolic (congestive) heart failure 04/17/2016 04/17/2016 L97.521 Non-pressure chronic ulcer of other part of left foot limited to breakdown of skin 02/17/2019 02/17/2019 S51.811D Laceration without foreign  body of right forearm, subsequent encounter 10/22/2017 10/22/2017 L03.116 Cellulitis of left lower limb 12/24/2017 12/24/2017 L89.620 Pressure ulcer  4x4 in [facility]) (Generic) Every Other Day/15 Days Discharge Instructions: Apply over primary dressing as directed. Secondary Dressing: ABD Pad, 5x9 (Home Health) (Generic) Every Other Day/15 Days Discharge Instructions: Apply over primary dressing as directed. Compression Wrap: Kerlix Roll 4.5x3.1 (in/yd) (Home Health) (Generic) Every Other Day/15 Days Discharge Instructions: Apply Kerlix and Coban compression as  directed. Compression Wrap: Coban Self-Adherent Wrap 4x5 (in/yd) (Home Health) (Generic) Every Other Day/15 Days Discharge Instructions: Apply over Kerlix as directed. Wound #39 - Calcaneus Wound Laterality: Right Cleanser: Wound Cleanser (Home Health) (Generic) Every Other Day/15 Days Discharge Instructions: Cleanse the wound with wound cleanser prior to applying a clean dressing using gauze sponges, not tissue or cotton balls. Cleanser: Soap and Water [facility]) Every Other Day/15 Days Discharge Instructions: May shower and wash wound with dial antibacterial soap and water prior to dressing change. Peri-Wound Care: Triamcinolone 15 (g) (Home Health) Every Other Day/15 Days Discharge Instructions: Use triamcinolone 15 (g) as directed Peri-Wound Care: Sween Lotion (Moisturizing lotion) (Home Health) Every Other Day/15 Days Discharge Instructions: Apply moisturizing lotion as directed Prim Dressing: Promogran Prisma Matrix, 4.34 (sq in) (silver collagen) (Home Health) Every Other Day/15 Days ary Discharge Instructions: Moisten collagen with saline or hydrogel Secondary Dressing: Woven Gauze Sponge, Non-Sterile 4x4 in (Home Health) (Generic) Every Other Day/15 Days Discharge Instructions: Apply over primary dressing as directed. Secondary Dressing: ABD Pad, 5x9 (Home Health) (Generic) Every Other Day/15 Days Discharge Instructions: Apply over primary dressing as directed. Compression Wrap: Kerlix Roll 4.5x3.1 (in/yd) (Home Health) (Generic) Every Other Day/15 Days Discharge Instructions: Apply Kerlix and Coban compression as directed. Compression Wrap: Coban Self-Adherent Wrap 4x5 (in/yd) (Home Health) (Generic) Every Other Day/15 Days Discharge Instructions: Apply over Kerlix as directed. Electronic Signature(s) Signed: 11/15/2020 5:43:33 PM By: Linton Ham MD Signed: 11/15/2020 5:55:16 PM By: [name] Entered By: [name] on 11/15/2020  11:01:42 -------------------------------------------------------------------------------- Problem List Details Patient Name: Date of Service: [name] Foley F. 11/15/2020 9:45 A M Medical Record Number: [id number] Patient Account Number: [id number] Date of Birth/Sex: Treating RN: [birth year] (75 y.o. [name], [name] Primary Care Provider: [name], [name] Other Clinician: Referring Provider: Treating Provider/Extender: Amiya Escamilla [name], [name] Weeks in Treatment: 239 Active Problems ICD-10 Encounter Code Description Active Date MDM Diagnosis E11.621 Type 2 diabetes mellitus with foot ulcer 04/17/2016 No Yes L97.518 Non-pressure chronic ulcer of other part of right foot with other specified 05/17/2020 No Yes severity L97.218 Non-pressure chronic ulcer of right calf with other specified severity 11/01/2020 No Yes S80.211D Abrasion, right knee, subsequent encounter 11/01/2020 No Yes L97.418 Non-pressure chronic ulcer of right heel and midfoot with other specified 11/15/2020 No Yes severity Inactive Problems ICD-10 Code Description Active Date Inactive Date L89.613 Pressure ulcer of right heel, stage 3 04/17/2016 04/17/2016 L97.221 Non-pressure chronic ulcer of left calf limited to breakdown of skin 08/15/2017 08/15/2017 L97.321 Non-pressure chronic ulcer of left ankle limited to breakdown of skin 07/01/2018 07/01/2018 L03.032 Cellulitis of left toe 01/06/2019 01/06/2019 N39.767 Pressure ulcer of left heel, stage 3 12/04/2016 12/04/2016 I25.119 Atherosclerotic heart disease of native coronary artery with unspecified angina 04/17/2016 04/17/2016 pectoris L97.821 Non-pressure chronic ulcer of other part of left lower leg limited to breakdown of skin 01/06/2019 01/06/2019 H41.93 Acute diastolic (congestive) heart failure 04/17/2016 04/17/2016 L97.521 Non-pressure chronic ulcer of other part of left foot limited to breakdown of skin 02/17/2019 02/17/2019 S51.811D Laceration without foreign  body of right forearm, subsequent encounter 10/22/2017 10/22/2017 L03.116 Cellulitis of left lower limb 12/24/2017 12/24/2017 L89.620 Pressure ulcer  4x4 in [facility]) (Generic) Every Other Day/15 Days Discharge Instructions: Apply over primary dressing as directed. Secondary Dressing: ABD Pad, 5x9 (Home Health) (Generic) Every Other Day/15 Days Discharge Instructions: Apply over primary dressing as directed. Compression Wrap: Kerlix Roll 4.5x3.1 (in/yd) (Home Health) (Generic) Every Other Day/15 Days Discharge Instructions: Apply Kerlix and Coban compression as  directed. Compression Wrap: Coban Self-Adherent Wrap 4x5 (in/yd) (Home Health) (Generic) Every Other Day/15 Days Discharge Instructions: Apply over Kerlix as directed. Wound #39 - Calcaneus Wound Laterality: Right Cleanser: Wound Cleanser (Home Health) (Generic) Every Other Day/15 Days Discharge Instructions: Cleanse the wound with wound cleanser prior to applying a clean dressing using gauze sponges, not tissue or cotton balls. Cleanser: Soap and Water [facility]) Every Other Day/15 Days Discharge Instructions: May shower and wash wound with dial antibacterial soap and water prior to dressing change. Peri-Wound Care: Triamcinolone 15 (g) (Home Health) Every Other Day/15 Days Discharge Instructions: Use triamcinolone 15 (g) as directed Peri-Wound Care: Sween Lotion (Moisturizing lotion) (Home Health) Every Other Day/15 Days Discharge Instructions: Apply moisturizing lotion as directed Prim Dressing: Promogran Prisma Matrix, 4.34 (sq in) (silver collagen) (Home Health) Every Other Day/15 Days ary Discharge Instructions: Moisten collagen with saline or hydrogel Secondary Dressing: Woven Gauze Sponge, Non-Sterile 4x4 in (Home Health) (Generic) Every Other Day/15 Days Discharge Instructions: Apply over primary dressing as directed. Secondary Dressing: ABD Pad, 5x9 (Home Health) (Generic) Every Other Day/15 Days Discharge Instructions: Apply over primary dressing as directed. Compression Wrap: Kerlix Roll 4.5x3.1 (in/yd) (Home Health) (Generic) Every Other Day/15 Days Discharge Instructions: Apply Kerlix and Coban compression as directed. Compression Wrap: Coban Self-Adherent Wrap 4x5 (in/yd) (Home Health) (Generic) Every Other Day/15 Days Discharge Instructions: Apply over Kerlix as directed. Electronic Signature(s) Signed: 11/15/2020 5:43:33 PM By: Linton Ham MD Signed: 11/15/2020 5:55:16 PM By: [name] Entered By: [name] on 11/15/2020  11:01:42 -------------------------------------------------------------------------------- Problem List Details Patient Name: Date of Service: [name] Foley F. 11/15/2020 9:45 A M Medical Record Number: [id number] Patient Account Number: [id number] Date of Birth/Sex: Treating RN: [birth year] (75 y.o. [name], [name] Primary Care Provider: [name], [name] Other Clinician: Referring Provider: Treating Provider/Extender: Amiya Escamilla [name], [name] Weeks in Treatment: 239 Active Problems ICD-10 Encounter Code Description Active Date MDM Diagnosis E11.621 Type 2 diabetes mellitus with foot ulcer 04/17/2016 No Yes L97.518 Non-pressure chronic ulcer of other part of right foot with other specified 05/17/2020 No Yes severity L97.218 Non-pressure chronic ulcer of right calf with other specified severity 11/01/2020 No Yes S80.211D Abrasion, right knee, subsequent encounter 11/01/2020 No Yes L97.418 Non-pressure chronic ulcer of right heel and midfoot with other specified 11/15/2020 No Yes severity Inactive Problems ICD-10 Code Description Active Date Inactive Date L89.613 Pressure ulcer of right heel, stage 3 04/17/2016 04/17/2016 L97.221 Non-pressure chronic ulcer of left calf limited to breakdown of skin 08/15/2017 08/15/2017 L97.321 Non-pressure chronic ulcer of left ankle limited to breakdown of skin 07/01/2018 07/01/2018 L03.032 Cellulitis of left toe 01/06/2019 01/06/2019 N39.767 Pressure ulcer of left heel, stage 3 12/04/2016 12/04/2016 I25.119 Atherosclerotic heart disease of native coronary artery with unspecified angina 04/17/2016 04/17/2016 pectoris L97.821 Non-pressure chronic ulcer of other part of left lower leg limited to breakdown of skin 01/06/2019 01/06/2019 H41.93 Acute diastolic (congestive) heart failure 04/17/2016 04/17/2016 L97.521 Non-pressure chronic ulcer of other part of left foot limited to breakdown of skin 02/17/2019 02/17/2019 S51.811D Laceration without foreign  body of right forearm, subsequent encounter 10/22/2017 10/22/2017 L03.116 Cellulitis of left lower limb 12/24/2017 12/24/2017 L89.620 Pressure ulcer  4x4 in [facility]) (Generic) Every Other Day/15 Days Discharge Instructions: Apply over primary dressing as directed. Secondary Dressing: ABD Pad, 5x9 (Home Health) (Generic) Every Other Day/15 Days Discharge Instructions: Apply over primary dressing as directed. Compression Wrap: Kerlix Roll 4.5x3.1 (in/yd) (Home Health) (Generic) Every Other Day/15 Days Discharge Instructions: Apply Kerlix and Coban compression as  directed. Compression Wrap: Coban Self-Adherent Wrap 4x5 (in/yd) (Home Health) (Generic) Every Other Day/15 Days Discharge Instructions: Apply over Kerlix as directed. Wound #39 - Calcaneus Wound Laterality: Right Cleanser: Wound Cleanser (Home Health) (Generic) Every Other Day/15 Days Discharge Instructions: Cleanse the wound with wound cleanser prior to applying a clean dressing using gauze sponges, not tissue or cotton balls. Cleanser: Soap and Water [facility]) Every Other Day/15 Days Discharge Instructions: May shower and wash wound with dial antibacterial soap and water prior to dressing change. Peri-Wound Care: Triamcinolone 15 (g) (Home Health) Every Other Day/15 Days Discharge Instructions: Use triamcinolone 15 (g) as directed Peri-Wound Care: Sween Lotion (Moisturizing lotion) (Home Health) Every Other Day/15 Days Discharge Instructions: Apply moisturizing lotion as directed Prim Dressing: Promogran Prisma Matrix, 4.34 (sq in) (silver collagen) (Home Health) Every Other Day/15 Days ary Discharge Instructions: Moisten collagen with saline or hydrogel Secondary Dressing: Woven Gauze Sponge, Non-Sterile 4x4 in (Home Health) (Generic) Every Other Day/15 Days Discharge Instructions: Apply over primary dressing as directed. Secondary Dressing: ABD Pad, 5x9 (Home Health) (Generic) Every Other Day/15 Days Discharge Instructions: Apply over primary dressing as directed. Compression Wrap: Kerlix Roll 4.5x3.1 (in/yd) (Home Health) (Generic) Every Other Day/15 Days Discharge Instructions: Apply Kerlix and Coban compression as directed. Compression Wrap: Coban Self-Adherent Wrap 4x5 (in/yd) (Home Health) (Generic) Every Other Day/15 Days Discharge Instructions: Apply over Kerlix as directed. Electronic Signature(s) Signed: 11/15/2020 5:43:33 PM By: Linton Ham MD Signed: 11/15/2020 5:55:16 PM By: [name] Entered By: [name] on 11/15/2020  11:01:42 -------------------------------------------------------------------------------- Problem List Details Patient Name: Date of Service: [name] Foley F. 11/15/2020 9:45 A M Medical Record Number: [id number] Patient Account Number: [id number] Date of Birth/Sex: Treating RN: [birth year] (75 y.o. [name], [name] Primary Care Provider: [name], [name] Other Clinician: Referring Provider: Treating Provider/Extender: Amiya Escamilla [name], [name] Weeks in Treatment: 239 Active Problems ICD-10 Encounter Code Description Active Date MDM Diagnosis E11.621 Type 2 diabetes mellitus with foot ulcer 04/17/2016 No Yes L97.518 Non-pressure chronic ulcer of other part of right foot with other specified 05/17/2020 No Yes severity L97.218 Non-pressure chronic ulcer of right calf with other specified severity 11/01/2020 No Yes S80.211D Abrasion, right knee, subsequent encounter 11/01/2020 No Yes L97.418 Non-pressure chronic ulcer of right heel and midfoot with other specified 11/15/2020 No Yes severity Inactive Problems ICD-10 Code Description Active Date Inactive Date L89.613 Pressure ulcer of right heel, stage 3 04/17/2016 04/17/2016 L97.221 Non-pressure chronic ulcer of left calf limited to breakdown of skin 08/15/2017 08/15/2017 L97.321 Non-pressure chronic ulcer of left ankle limited to breakdown of skin 07/01/2018 07/01/2018 L03.032 Cellulitis of left toe 01/06/2019 01/06/2019 N39.767 Pressure ulcer of left heel, stage 3 12/04/2016 12/04/2016 I25.119 Atherosclerotic heart disease of native coronary artery with unspecified angina 04/17/2016 04/17/2016 pectoris L97.821 Non-pressure chronic ulcer of other part of left lower leg limited to breakdown of skin 01/06/2019 01/06/2019 H41.93 Acute diastolic (congestive) heart failure 04/17/2016 04/17/2016 L97.521 Non-pressure chronic ulcer of other part of left foot limited to breakdown of skin 02/17/2019 02/17/2019 S51.811D Laceration without foreign  body of right forearm, subsequent encounter 10/22/2017 10/22/2017 L03.116 Cellulitis of left lower limb 12/24/2017 12/24/2017 L89.620 Pressure ulcer  of left heel, unstageable 07/21/2019 07/21/2019 L97.524 Non-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 06/14/2020 06/14/2020 L97.211 Non-pressure chronic ulcer of right calf limited to breakdown of skin 07/21/2019 07/21/2019 M86.172 Other acute osteomyelitis, left ankle and foot 08/30/2020 08/30/2020 S80.211D Abrasion, right knee, subsequent encounter 08/18/2019 08/18/2019 Resolved Problems ICD-10 Code Description Active Date Resolved Date L89.512 Pressure ulcer of right ankle, stage 2 04/17/2016 04/17/2016 L89.522 Pressure ulcer of left ankle, stage 2 04/17/2016 04/17/2016 Electronic Signature(s) Signed: 11/15/2020 5:43:33 PM By: Linton Ham MD Entered By: Linton Ham on 11/15/2020 12:26:45 -------------------------------------------------------------------------------- Progress Note Details Patient Name: Date of Service: [name] Foley F. 11/15/2020 9:45 A M Medical Record Number: [id number] Patient Account Number: [id number] Date of Birth/Sex: Treating RN: Mar 23, 1946 (75 y.o. [name], [name] Primary Care Provider: [name], [name] Other Clinician: Referring Provider: Treating Provider/Extender: Corri Delapaz [name], [name] Weeks in Treatment: 239 Subjective History of Present Illness (HPI) The following HPI elements were documented for the patient's wound: Location: On the left and right lateral forefoot which has been there for about 6 months Quality: Patient reports No Pain. Severity: Patient states wound(s) are getting worse. Duration: Patient has had the wound for > 6 months prior to seeking treatment at the wound center Context: The wound would happen gradually Modifying Factors: Patient wound(s)/ulcer(s) are worsening due to :continual drainage from the wound Associated Signs and Symptoms: Patient  reports having increase discharge. This patient returns after being seen here till the end of August and he was lost to follow-up. he has been quite debilitated laying in bed most of the time and his condition has deteriorated significantly. He has multiple ulcerations on the heel lateral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

## 2020-11-16 ENCOUNTER — Telehealth: Payer: Self-pay | Admitting: Family Medicine

## 2020-11-16 NOTE — Telephone Encounter (Signed)
[name] [name] is calling to get verbal orders for Nursing 1 week 3 Wound Care Management.  May leave a detail msg on the secured voice mail.

## 2020-11-16 NOTE — Telephone Encounter (Signed)
Verbal given to [employer].

## 2020-11-17 DIAGNOSIS — N184 Chronic kidney disease, stage 4 (severe): Secondary | ICD-10-CM | POA: Diagnosis not present

## 2020-11-17 DIAGNOSIS — Z4781 Encounter for orthopedic aftercare following surgical amputation: Secondary | ICD-10-CM | POA: Diagnosis not present

## 2020-11-17 DIAGNOSIS — I5042 Chronic combined systolic (congestive) and diastolic (congestive) heart failure: Secondary | ICD-10-CM | POA: Diagnosis not present

## 2020-11-17 DIAGNOSIS — I2510 Atherosclerotic heart disease of native coronary artery without angina pectoris: Secondary | ICD-10-CM | POA: Diagnosis not present

## 2020-11-17 DIAGNOSIS — Z89612 Acquired absence of left leg above knee: Secondary | ICD-10-CM | POA: Diagnosis not present

## 2020-11-17 DIAGNOSIS — I130 Hypertensive heart and chronic kidney disease with heart failure and stage 1 through stage 4 chronic kidney disease, or unspecified chronic kidney disease: Secondary | ICD-10-CM | POA: Diagnosis not present

## 2020-11-17 DIAGNOSIS — E1142 Type 2 diabetes mellitus with diabetic polyneuropathy: Secondary | ICD-10-CM | POA: Diagnosis not present

## 2020-11-17 DIAGNOSIS — I69351 Hemiplegia and hemiparesis following cerebral infarction affecting right dominant side: Secondary | ICD-10-CM | POA: Diagnosis not present

## 2020-11-17 DIAGNOSIS — E1122 Type 2 diabetes mellitus with diabetic chronic kidney disease: Secondary | ICD-10-CM | POA: Diagnosis not present

## 2020-11-21 DIAGNOSIS — Z89612 Acquired absence of left leg above knee: Secondary | ICD-10-CM | POA: Diagnosis not present

## 2020-11-21 DIAGNOSIS — I130 Hypertensive heart and chronic kidney disease with heart failure and stage 1 through stage 4 chronic kidney disease, or unspecified chronic kidney disease: Secondary | ICD-10-CM | POA: Diagnosis not present

## 2020-11-21 DIAGNOSIS — N184 Chronic kidney disease, stage 4 (severe): Secondary | ICD-10-CM | POA: Diagnosis not present

## 2020-11-21 DIAGNOSIS — E1142 Type 2 diabetes mellitus with diabetic polyneuropathy: Secondary | ICD-10-CM | POA: Diagnosis not present

## 2020-11-21 DIAGNOSIS — I69351 Hemiplegia and hemiparesis following cerebral infarction affecting right dominant side: Secondary | ICD-10-CM | POA: Diagnosis not present

## 2020-11-21 DIAGNOSIS — I2510 Atherosclerotic heart disease of native coronary artery without angina pectoris: Secondary | ICD-10-CM | POA: Diagnosis not present

## 2020-11-21 DIAGNOSIS — E1122 Type 2 diabetes mellitus with diabetic chronic kidney disease: Secondary | ICD-10-CM | POA: Diagnosis not present

## 2020-11-21 DIAGNOSIS — I5042 Chronic combined systolic (congestive) and diastolic (congestive) heart failure: Secondary | ICD-10-CM | POA: Diagnosis not present

## 2020-11-21 DIAGNOSIS — Z4781 Encounter for orthopedic aftercare following surgical amputation: Secondary | ICD-10-CM | POA: Diagnosis not present

## 2020-11-21 NOTE — Progress Notes (Signed)
COOLIDGE, COFFING ([id number]) Visit Report for 11/15/2020 Arrival Information Details Patient Name: Date of Service: [name] [name] [facility] F. 11/15/2020 9:45 A M Medical Record Number: [id number] Patient Account Number: [id number] Date of Birth/Sex: Treating RN: 02-21-1946 (75 y.o. [name] [name], [place] Primary Care Kaya Klausing: [name], [name] Other Clinician: Referring Skarlet Lyons: Treating Rosemae Mcquown/Extender: [name], [name] [name], [name] Weeks in Treatment: 58 Visit Information History Since Last Visit Added or deleted any medications: No Patient Arrived: Wheel Chair Any new allergies or adverse reactions: No Arrival Time: 09:47 Had a fall or experienced change in No Accompanied By: daughter activities of daily living that may affect Transfer Assistance: None risk of falls: Patient Identification Verified: Yes Signs or symptoms of abuse/neglect since last visito No Secondary Verification Process Completed: Yes Hospitalized since last visit: No Patient Requires Transmission-Based Precautions: No Implantable device outside of the clinic excluding No Patient Has Alerts: Yes cellular tissue based products placed in the center Patient Alerts: Patient on Blood Thinner since last visit: left ABI 1.0; rt ABI 1.09 Has Dressing in Place as Prescribed: Yes Pain Present Now: No Electronic Signature(s) Signed: 11/15/2020 5:55:16 PM By: [name] Entered By: [name] on 11/15/2020 09:47:59 -------------------------------------------------------------------------------- Lower Extremity Assessment Details Patient Name: Date of Service: [name] [name] F. 11/15/2020 9:45 A M Medical Record Number: [id number] Patient Account Number: [id number] Date of Birth/Sex: Treating RN: February 21, 1946 (75 y.o. [name] [name] Primary Care Ellie Spickler: [name], [name] Other Clinician: Referring Berk Pilot: Treating Laasia Arcos/Extender: [name], [name] [name], [name] Weeks in Treatment: 239 Edema  Assessment Assessed: Shirlyn Goltz: No] Patrice Paradise: Yes] Edema: [Left: Ye] [Right: s] Calf Left: Right: Point of Measurement: 36 cm From Medial Instep 23.5 cm Ankle Left: Right: Point of Measurement: 11 cm From Medial Instep 21.5 cm Electronic Signature(s) Signed: 11/15/2020 5:46:34 PM By: Deon Pilling Entered By: Deon Pilling on 11/15/2020 09:54:25 -------------------------------------------------------------------------------- Multi Wound Chart Details Patient Name: Date of Service: [name] [name] F. 11/15/2020 9:45 A M Medical Record Number: [id number] Patient Account Number: [id number] Date of Birth/Sex: Treating RN: 1945/07/28 (75 y.o. [name] [name], [name] [name] Primary Care Jammie Clink: [name], [name] Other Clinician: Referring Estephanie Hubbs: Treating Rori Goar/Extender: [name], [name] [name], [name] Weeks in Treatment: 239 Vital Signs Height(in): 74 Capillary Blood Glucose(mg/dl): 121 Weight(lbs): 150 Pulse(bpm): 64 Body Mass Index(BMI): 19 Blood Pressure(mmHg): 126/69 Temperature(F): 98.6 Respiratory Rate(breaths/min): 20 Photos: Right, Lateral Lower Leg Right, Medial, Dorsal Foot Right Knee Wound Location: Gradually Appeared Gradually Appeared Bump Wounding Event: Venous Leg Ulcer Diabetic Wound/Ulcer of the Lower Abrasion Primary Etiology: Extremity N/A N/A N/A Secondary Etiology: Anemia, Sleep Apnea, Arrhythmia, Anemia, Sleep Apnea, Arrhythmia, Anemia, Sleep Apnea, Arrhythmia, Comorbid History: Congestive Heart Failure, Coronary Congestive Heart Failure, Coronary Congestive Heart Failure, Coronary Artery Disease, Hypertension, Artery Disease, Hypertension, Artery Disease, Hypertension, Myocardial Infarction, Type II Myocardial Infarction, Type II Myocardial Infarction, Type II Diabetes, Gout, Neuropathy Diabetes, Gout, Neuropathy Diabetes, Gout, Neuropathy 07/06/2019 06/14/2020 10/30/2020 Date Acquired: 108 22 2 Weeks of Treatment: Open Open Healed - Epithelialized Wound  Status: 5.5x1.8x0.1 1.4x1.9x1 0x0x0 Measurements L x W x D (cm) 7.775 2.089 0 A (cm) : rea 0.778 2.089 0 Volume (cm) : -184.50% -659.60% 100.00% % Reduction in A rea: -185.00% -7637.00% 100.00% % Reduction in Volume: Full Thickness Without Exposed Grade 2 Full Thickness Without Exposed Classification: Support Structures Support Structures Large Medium Medium Exudate A mount: Purulent Purulent Serosanguineous Exudate Type: yellow, brown, green yellow, brown, green red, brown Exudate Color: Flat and Intact Epibole Distinct, outline attached Wound Margin: None Present (0%) Small (1-33%) Large (67-100%) Granulation  HNNY F. 11/15/2020 9:45 A M Medical Record Number: [id number] Patient Account Number: [id number] Date of Birth/Sex: Treating RN: 02-18-1946 (75 y.o. [name] [name], [name] [name] Primary Care Loriel Diehl: [name], [name] Other Clinician: Referring Ronnetta Currington: Treating Jojuan Champney/Extender: [name], [name] [name], [name] Weeks in Treatment: 239 Vital Signs Time Taken: 09:47 Temperature (F): 98.6 Height (in): 74 Pulse (bpm): 73 Weight (lbs): 150 Respiratory Rate (breaths/min): 20 Body Mass Index (BMI): 19.3 Blood Pressure (mmHg): 126/69 Capillary Blood Glucose (mg/dl): 121 Reference Range: 80 - 120 mg / dl Electronic Signature(s) Signed: 11/15/2020 5:55:16 PM By: [name] Entered By: [name] on 11/15/2020 09:48:23  A mount: N/A Red Pink, Pale Granulation Quality: Large (67-100%) Large (67-100%) None Present (0%) Necrotic A mount: Adherent Slough Adherent Slough N/A Necrotic Tissue: Fat Layer (Subcutaneous Tissue): Yes Fat Layer (Subcutaneous Tissue): Yes Fat Layer (Subcutaneous Tissue): Yes Exposed Structures: Fascia: No Tendon: Yes Fascia: No Tendon: No Bone: Yes Tendon: No Muscle: No Fascia: No Muscle: No Joint: No Muscle: No Joint: No Bone: No Joint: No Bone: No None None None Epithelialization: Debridement - Excisional N/A N/A Debridement: Pre-procedure Verification/Time Out 10:55 N/A N/A Taken: Lidocaine N/A N/A Pain Control: Subcutaneous, Slough N/A N/A Tissue Debrided: Skin/Subcutaneous Tissue N/A N/A Level: 9.9 N/A N/A Debridement A (sq cm): rea Curette N/A N/A Instrument: Minimum N/A N/A Bleeding: Pressure N/A N/A Hemostasis A chieved: 0 N/A N/A Procedural Pain: 0 N/A N/A Post Procedural Pain: Procedure was tolerated well N/A N/A Debridement Treatment Response: 5.5x1.8x0.1 N/A N/A Post Debridement Measurements L x W x D (cm) 0.778 N/A N/A Post Debridement Volume: (cm) N/A N/A N/A Post Debridement Stage: macerated periowund. macerated periwound. N/A Assessment Notes: Debridement N/A N/A Procedures Performed: Wound Number: [id number]  N/A N/A Photos: N/A N/A Right Calcaneus N/A N/A Wound Location: Pressure Injury N/A N/A Wounding Event: Pressure Ulcer N/A N/A Primary Etiology: Arterial Insufficiency Ulcer N/A N/A Secondary Etiology: Anemia, Sleep Apnea, Arrhythmia, N/A N/A Comorbid History: Congestive Heart Failure, Coronary Artery Disease, Hypertension, Myocardial Infarction, Type II Diabetes, Gout, Neuropathy 11/15/2020 N/A N/A Date Acquired: 0 N/A N/A Weeks of Treatment: Open N/A N/A Wound Status: 1.8x1.8x0.1 N/A N/A Measurements L x W x D (cm) 2.545 N/A N/A A (cm) : rea 0.254 N/A N/A Volume (cm) : 0.00% N/A N/A % Reduction in A rea: 0.00% N/A N/A % Reduction in Volume: Unstageable/Unclassified N/A N/A Classification: Medium N/A N/A Exudate A mount: Serosanguineous N/A N/A Exudate Type: red, brown N/A N/A Exudate Color: Distinct, outline attached N/A N/A Wound Margin: None Present (0%) N/A N/A Granulation A mount: N/A N/A N/A Granulation Quality: Large (67-100%) N/A N/A Necrotic A mount: Eschar, Adherent Slough N/A N/A Necrotic Tissue: Fascia: No N/A N/A Exposed Structures: Fat Layer (Subcutaneous Tissue): No Tendon: No Muscle: No Joint: No Bone: No None N/A N/A Epithelialization: Debridement - Excisional N/A N/A Debridement: Pre-procedure Verification/Time Out 10:55 N/A N/A Taken: Lidocaine N/A N/A Pain Control: Subcutaneous, Slough N/A N/A Tissue Debrided: Skin/Subcutaneous Tissue N/A N/A Level: 3.24 N/A N/A Debridement A (sq cm): rea Curette N/A N/A Instrument: Minimum N/A N/A Bleeding: Pressure N/A N/A Hemostasis A chieved: 0 N/A N/A Procedural Pain: 0 N/A N/A Post Procedural Pain: Procedure was tolerated well N/A N/A Debridement Treatment Response: 1.8x1.8x0.1 N/A N/A Post Debridement Measurements L x W x D (cm) 0.254 N/A N/A Post Debridement Volume: (cm) Unstageable/Unclassified N/A N/A Post Debridement Stage: N/A N/A N/A Assessment  Notes: Debridement N/A N/A Procedures Performed: Treatment Notes Electronic Signature(s) Signed: 11/15/2020 5:43:33 PM By: Linton Ham MD Signed: 11/15/2020 5:55:16 PM By: [name] Entered By: Linton Ham on 11/15/2020 12:29:13 -------------------------------------------------------------------------------- Multi-Disciplinary Care Plan Details Patient Name: Date of Service: [name] [name] F. 11/15/2020 9:45 A M Medical Record Number: [id number] Patient Account Number: [id number] Date of Birth/Sex: Treating RN: [birth year] (75 y.o. [name] Primary Care Rayana Geurin: [name], [name] Other Clinician: Referring Emarie Paul: Treating Bearett Porcaro/Extender: [name], [name] [name], [name] Weeks in Treatment: [place] reviewed with physician Active Inactive Wound/Skin Impairment Nursing Diagnoses: Impaired tissue integrity Goals: Patient/caregiver will verbalize understanding of skin care regimen Date Initiated: 11/19/2017 Target Resolution Date: 10/27/2020 Goal Status: Active Ulcer/skin breakdown will  HNNY F. 11/15/2020 9:45 A M Medical Record Number: [id number] Patient Account Number: [id number] Date of Birth/Sex: Treating RN: 02-18-1946 (75 y.o. [name] [name], [name] [name] Primary Care Loriel Diehl: [name], [name] Other Clinician: Referring Ronnetta Currington: Treating Jojuan Champney/Extender: [name], [name] [name], [name] Weeks in Treatment: 239 Vital Signs Time Taken: 09:47 Temperature (F): 98.6 Height (in): 74 Pulse (bpm): 73 Weight (lbs): 150 Respiratory Rate (breaths/min): 20 Body Mass Index (BMI): 19.3 Blood Pressure (mmHg): 126/69 Capillary Blood Glucose (mg/dl): 121 Reference Range: 80 - 120 mg / dl Electronic Signature(s) Signed: 11/15/2020 5:55:16 PM By: [name] Entered By: [name] on 11/15/2020 09:48:23  A mount: N/A Red Pink, Pale Granulation Quality: Large (67-100%) Large (67-100%) None Present (0%) Necrotic A mount: Adherent Slough Adherent Slough N/A Necrotic Tissue: Fat Layer (Subcutaneous Tissue): Yes Fat Layer (Subcutaneous Tissue): Yes Fat Layer (Subcutaneous Tissue): Yes Exposed Structures: Fascia: No Tendon: Yes Fascia: No Tendon: No Bone: Yes Tendon: No Muscle: No Fascia: No Muscle: No Joint: No Muscle: No Joint: No Bone: No Joint: No Bone: No None None None Epithelialization: Debridement - Excisional N/A N/A Debridement: Pre-procedure Verification/Time Out 10:55 N/A N/A Taken: Lidocaine N/A N/A Pain Control: Subcutaneous, Slough N/A N/A Tissue Debrided: Skin/Subcutaneous Tissue N/A N/A Level: 9.9 N/A N/A Debridement A (sq cm): rea Curette N/A N/A Instrument: Minimum N/A N/A Bleeding: Pressure N/A N/A Hemostasis A chieved: 0 N/A N/A Procedural Pain: 0 N/A N/A Post Procedural Pain: Procedure was tolerated well N/A N/A Debridement Treatment Response: 5.5x1.8x0.1 N/A N/A Post Debridement Measurements L x W x D (cm) 0.778 N/A N/A Post Debridement Volume: (cm) N/A N/A N/A Post Debridement Stage: macerated periowund. macerated periwound. N/A Assessment Notes: Debridement N/A N/A Procedures Performed: Wound Number: 39  N/A N/A Photos: N/A N/A Right Calcaneus N/A N/A Wound Location: Pressure Injury N/A N/A Wounding Event: Pressure Ulcer N/A N/A Primary Etiology: Arterial Insufficiency Ulcer N/A N/A Secondary Etiology: Anemia, Sleep Apnea, Arrhythmia, N/A N/A Comorbid History: Congestive Heart Failure, Coronary Artery Disease, Hypertension, Myocardial Infarction, Type II Diabetes, Gout, Neuropathy 11/15/2020 N/A N/A Date Acquired: 0 N/A N/A Weeks of Treatment: Open N/A N/A Wound Status: 1.8x1.8x0.1 N/A N/A Measurements L x W x D (cm) 2.545 N/A N/A A (cm) : rea 0.254 N/A N/A Volume (cm) : 0.00% N/A N/A % Reduction in A rea: 0.00% N/A N/A % Reduction in Volume: Unstageable/Unclassified N/A N/A Classification: Medium N/A N/A Exudate A mount: Serosanguineous N/A N/A Exudate Type: red, brown N/A N/A Exudate Color: Distinct, outline attached N/A N/A Wound Margin: None Present (0%) N/A N/A Granulation A mount: N/A N/A N/A Granulation Quality: Large (67-100%) N/A N/A Necrotic A mount: Eschar, Adherent Slough N/A N/A Necrotic Tissue: Fascia: No N/A N/A Exposed Structures: Fat Layer (Subcutaneous Tissue): No Tendon: No Muscle: No Joint: No Bone: No None N/A N/A Epithelialization: Debridement - Excisional N/A N/A Debridement: Pre-procedure Verification/Time Out 10:55 N/A N/A Taken: Lidocaine N/A N/A Pain Control: Subcutaneous, Slough N/A N/A Tissue Debrided: Skin/Subcutaneous Tissue N/A N/A Level: 3.24 N/A N/A Debridement A (sq cm): rea Curette N/A N/A Instrument: Minimum N/A N/A Bleeding: Pressure N/A N/A Hemostasis A chieved: 0 N/A N/A Procedural Pain: 0 N/A N/A Post Procedural Pain: Procedure was tolerated well N/A N/A Debridement Treatment Response: 1.8x1.8x0.1 N/A N/A Post Debridement Measurements L x W x D (cm) 0.254 N/A N/A Post Debridement Volume: (cm) Unstageable/Unclassified N/A N/A Post Debridement Stage: N/A N/A N/A Assessment  Notes: Debridement N/A N/A Procedures Performed: Treatment Notes Electronic Signature(s) Signed: 11/15/2020 5:43:33 PM By: Linton Ham MD Signed: 11/15/2020 5:55:16 PM By: [name] Entered By: Linton Ham on 11/15/2020 12:29:13 -------------------------------------------------------------------------------- Multi-Disciplinary Care Plan Details Patient Name: Date of Service: [name] [name] F. 11/15/2020 9:45 A M Medical Record Number: [id number] Patient Account Number: [id number] Date of Birth/Sex: Treating RN: [birth year] (75 y.o. [name] Primary Care Rayana Geurin: [name], [name] Other Clinician: Referring Emarie Paul: Treating Bearett Porcaro/Extender: [name], [name] [name], [name] Weeks in Treatment: [place] reviewed with physician Active Inactive Wound/Skin Impairment Nursing Diagnoses: Impaired tissue integrity Goals: Patient/caregiver will verbalize understanding of skin care regimen Date Initiated: 11/19/2017 Target Resolution Date: 10/27/2020 Goal Status: Active Ulcer/skin breakdown will

## 2020-11-23 ENCOUNTER — Telehealth: Payer: Self-pay | Admitting: Family Medicine

## 2020-11-23 DIAGNOSIS — Z4781 Encounter for orthopedic aftercare following surgical amputation: Secondary | ICD-10-CM | POA: Diagnosis not present

## 2020-11-23 DIAGNOSIS — I69351 Hemiplegia and hemiparesis following cerebral infarction affecting right dominant side: Secondary | ICD-10-CM | POA: Diagnosis not present

## 2020-11-23 DIAGNOSIS — E1122 Type 2 diabetes mellitus with diabetic chronic kidney disease: Secondary | ICD-10-CM | POA: Diagnosis not present

## 2020-11-23 DIAGNOSIS — Z794 Long term (current) use of insulin: Secondary | ICD-10-CM

## 2020-11-23 DIAGNOSIS — E1142 Type 2 diabetes mellitus with diabetic polyneuropathy: Secondary | ICD-10-CM | POA: Diagnosis not present

## 2020-11-23 DIAGNOSIS — I2510 Atherosclerotic heart disease of native coronary artery without angina pectoris: Secondary | ICD-10-CM | POA: Diagnosis not present

## 2020-11-23 DIAGNOSIS — I130 Hypertensive heart and chronic kidney disease with heart failure and stage 1 through stage 4 chronic kidney disease, or unspecified chronic kidney disease: Secondary | ICD-10-CM | POA: Diagnosis not present

## 2020-11-23 DIAGNOSIS — Z89612 Acquired absence of left leg above knee: Secondary | ICD-10-CM | POA: Diagnosis not present

## 2020-11-23 DIAGNOSIS — E1121 Type 2 diabetes mellitus with diabetic nephropathy: Secondary | ICD-10-CM

## 2020-11-23 DIAGNOSIS — I5042 Chronic combined systolic (congestive) and diastolic (congestive) heart failure: Secondary | ICD-10-CM | POA: Diagnosis not present

## 2020-11-23 DIAGNOSIS — N184 Chronic kidney disease, stage 4 (severe): Secondary | ICD-10-CM | POA: Diagnosis not present

## 2020-11-23 NOTE — Telephone Encounter (Signed)
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 He has not heard anything and want some followup.

## 2020-11-25 NOTE — Addendum Note (Signed)
Addended by: Rodrigo Ran on: 11/25/2020 10:11 AM   Modules accepted: Orders

## 2020-11-25 NOTE — Telephone Encounter (Signed)
If HH provides foot care service, it can be added. Thanks, BJ

## 2020-11-25 NOTE — Telephone Encounter (Signed)
I called and spoke with patient, updated him that foot care services have been added onto his current home health referral. Updated referral placed to be sent to [facility].

## 2020-11-29 DIAGNOSIS — N184 Chronic kidney disease, stage 4 (severe): Secondary | ICD-10-CM | POA: Diagnosis not present

## 2020-11-29 DIAGNOSIS — Z89612 Acquired absence of left leg above knee: Secondary | ICD-10-CM | POA: Diagnosis not present

## 2020-11-29 DIAGNOSIS — Z4781 Encounter for orthopedic aftercare following surgical amputation: Secondary | ICD-10-CM | POA: Diagnosis not present

## 2020-11-29 DIAGNOSIS — I5042 Chronic combined systolic (congestive) and diastolic (congestive) heart failure: Secondary | ICD-10-CM | POA: Diagnosis not present

## 2020-11-29 DIAGNOSIS — E1142 Type 2 diabetes mellitus with diabetic polyneuropathy: Secondary | ICD-10-CM | POA: Diagnosis not present

## 2020-11-29 DIAGNOSIS — I2510 Atherosclerotic heart disease of native coronary artery without angina pectoris: Secondary | ICD-10-CM | POA: Diagnosis not present

## 2020-11-29 DIAGNOSIS — I69351 Hemiplegia and hemiparesis following cerebral infarction affecting right dominant side: Secondary | ICD-10-CM | POA: Diagnosis not present

## 2020-11-29 DIAGNOSIS — I130 Hypertensive heart and chronic kidney disease with heart failure and stage 1 through stage 4 chronic kidney disease, or unspecified chronic kidney disease: Secondary | ICD-10-CM | POA: Diagnosis not present

## 2020-11-29 DIAGNOSIS — E1122 Type 2 diabetes mellitus with diabetic chronic kidney disease: Secondary | ICD-10-CM | POA: Diagnosis not present

## 2020-12-01 DIAGNOSIS — I2510 Atherosclerotic heart disease of native coronary artery without angina pectoris: Secondary | ICD-10-CM | POA: Diagnosis not present

## 2020-12-01 DIAGNOSIS — E1122 Type 2 diabetes mellitus with diabetic chronic kidney disease: Secondary | ICD-10-CM | POA: Diagnosis not present

## 2020-12-01 DIAGNOSIS — N184 Chronic kidney disease, stage 4 (severe): Secondary | ICD-10-CM | POA: Diagnosis not present

## 2020-12-01 DIAGNOSIS — I5042 Chronic combined systolic (congestive) and diastolic (congestive) heart failure: Secondary | ICD-10-CM | POA: Diagnosis not present

## 2020-12-01 DIAGNOSIS — Z89612 Acquired absence of left leg above knee: Secondary | ICD-10-CM | POA: Diagnosis not present

## 2020-12-01 DIAGNOSIS — I69351 Hemiplegia and hemiparesis following cerebral infarction affecting right dominant side: Secondary | ICD-10-CM | POA: Diagnosis not present

## 2020-12-01 DIAGNOSIS — I130 Hypertensive heart and chronic kidney disease with heart failure and stage 1 through stage 4 chronic kidney disease, or unspecified chronic kidney disease: Secondary | ICD-10-CM | POA: Diagnosis not present

## 2020-12-01 DIAGNOSIS — Z4781 Encounter for orthopedic aftercare following surgical amputation: Secondary | ICD-10-CM | POA: Diagnosis not present

## 2020-12-01 DIAGNOSIS — E1142 Type 2 diabetes mellitus with diabetic polyneuropathy: Secondary | ICD-10-CM | POA: Diagnosis not present

## 2020-12-06 ENCOUNTER — Encounter (HOSPITAL_BASED_OUTPATIENT_CLINIC_OR_DEPARTMENT_OTHER): Payer: Medicare HMO | Attending: Internal Medicine | Admitting: Internal Medicine

## 2020-12-06 ENCOUNTER — Telehealth: Payer: Self-pay | Admitting: Pharmacist

## 2020-12-06 ENCOUNTER — Other Ambulatory Visit: Payer: Self-pay

## 2020-12-06 DIAGNOSIS — I252 Old myocardial infarction: Secondary | ICD-10-CM | POA: Insufficient documentation

## 2020-12-06 DIAGNOSIS — L97218 Non-pressure chronic ulcer of right calf with other specified severity: Secondary | ICD-10-CM | POA: Insufficient documentation

## 2020-12-06 DIAGNOSIS — L97518 Non-pressure chronic ulcer of other part of right foot with other specified severity: Secondary | ICD-10-CM | POA: Diagnosis not present

## 2020-12-06 DIAGNOSIS — S80211A Abrasion, right knee, initial encounter: Secondary | ICD-10-CM | POA: Diagnosis not present

## 2020-12-06 DIAGNOSIS — Z89612 Acquired absence of left leg above knee: Secondary | ICD-10-CM | POA: Insufficient documentation

## 2020-12-06 DIAGNOSIS — G4730 Sleep apnea, unspecified: Secondary | ICD-10-CM | POA: Insufficient documentation

## 2020-12-06 DIAGNOSIS — N189 Chronic kidney disease, unspecified: Secondary | ICD-10-CM | POA: Diagnosis not present

## 2020-12-06 DIAGNOSIS — I5042 Chronic combined systolic (congestive) and diastolic (congestive) heart failure: Secondary | ICD-10-CM | POA: Insufficient documentation

## 2020-12-06 DIAGNOSIS — I2510 Atherosclerotic heart disease of native coronary artery without angina pectoris: Secondary | ICD-10-CM | POA: Diagnosis not present

## 2020-12-06 DIAGNOSIS — I130 Hypertensive heart and chronic kidney disease with heart failure and stage 1 through stage 4 chronic kidney disease, or unspecified chronic kidney disease: Secondary | ICD-10-CM | POA: Diagnosis not present

## 2020-12-06 DIAGNOSIS — L97418 Non-pressure chronic ulcer of right heel and midfoot with other specified severity: Secondary | ICD-10-CM | POA: Insufficient documentation

## 2020-12-06 DIAGNOSIS — E1142 Type 2 diabetes mellitus with diabetic polyneuropathy: Secondary | ICD-10-CM | POA: Diagnosis not present

## 2020-12-06 DIAGNOSIS — Z95810 Presence of automatic (implantable) cardiac defibrillator: Secondary | ICD-10-CM | POA: Diagnosis not present

## 2020-12-06 DIAGNOSIS — Z8673 Personal history of transient ischemic attack (TIA), and cerebral infarction without residual deficits: Secondary | ICD-10-CM | POA: Diagnosis not present

## 2020-12-06 DIAGNOSIS — W228XXA Striking against or struck by other objects, initial encounter: Secondary | ICD-10-CM | POA: Insufficient documentation

## 2020-12-06 DIAGNOSIS — E11621 Type 2 diabetes mellitus with foot ulcer: Secondary | ICD-10-CM | POA: Insufficient documentation

## 2020-12-06 DIAGNOSIS — L97512 Non-pressure chronic ulcer of other part of right foot with fat layer exposed: Secondary | ICD-10-CM | POA: Diagnosis not present

## 2020-12-06 NOTE — Chronic Care Management (AMB) (Signed)
Chronic Care Management Pharmacy Assistant   Name: [name] [name]  MRN: [id number] DOB: Jan 18, 1946  Reason for Encounter: Disease State/ General Assessment Call    Recent office visits:  11-09-2020 [name], [name] G, MD - Patient presented for Type 2 diabetes mellitus with diabetic nephropathy, with long-term current use of insulin and other concerns. No medication changes  Recent consult visits:  11-15-2020 Ricard Dillon, MD [facility]) - Patient presented for wound care.Bilateral heel ulcers No medication changes.  11-08-2020 Gabriel Earing, PA-C (Vascular Surg) - Patient presented for PAD. No medication changes.   11-01-2020 (09-27-2020 ,09-06-2020, 08-30-2020, 08-23-2020,07-26-20, -06-14-2020) Ricard Dillon, MD ([place]) - Patient presented for wound care. No medication changes.  09-27-2020 [name], MD (Vascular Surg) - Patient presented for PAD. No medication changes.  09-21-2020 Setzer, Edman Circle, PA-C ( Vascular Surg) - Patient presented for diabetic foot ulcer on left foot and other issues. No medication changes.  09-01-2020 Truman Hayward, MD (Infectious Disease) - Patient presented for Chronic osteomyelitis of toe, left. No medication changes.   08-18-2020 Bensimhon, Shaune Pascal, MD ( Cardiology) - Patient presented for CHF and other concerns. No medication changes.  08-18-2020 Patient presented to [facility] for Echocardiogram  08-16-2020 Patient presented to [facility] for Greasy Left w/o Contrast  08-09-2020 Kalman Shan Ratliff, DO (Wound Ctr) - Patient presented for bilateral heel ulcer follow up. No medication changes.  06-23-2020 [name], MD (Cardiovascular) - Patient presented for Abdominal Aortogram w/ lower extremity. No medication changes.  06-21-2020 Ricard Dillon, MD - Patient presented to [facility] for DG X- Ray.  06-14-2020 Cherre Robins, MD - (Vascular) - Patient presented for  Atherosclerosis of native arteries of the extremities with ulceration. No medication changes.   Hospital visits:  Medication Reconciliation was completed by comparing discharge summary, patient's EMR and Pharmacy list, and upon discussion with patient.  Patient presented to [facility] on 10-10-2020 due to CHF and other issues. Discharge date was 10-13-2020.   New?Medications Started at [facility] Discharge:?? None  Medication Changes at Hospital Discharge: Insulin aspart 100 u/ml -inject 3 times daily with meds Carvedilol 3.125 -  two times daily Lantus 100u/ml - inject 10 units at bedtime Losartan 25 mg - take half tablet daily   Medications Discontinued at Hospital Discharge: None  Medications that remain the same after Hospital Discharge:??  -All other medications will remain the same.     Medication Reconciliation was completed by comparing discharge summary, patient's EMR and Pharmacy list, and upon discussion with patient.  Patient presented to [facility] on 06-23-2020 due to abdominal aortogram. Patient was there for 5 hours.   New?Medications Started at [facility] Discharge:?? None  Medication Changes at Hospital Discharge: None   Medications Discontinued at Hospital Discharge: None  Medications that remain the same after Hospital Discharge:??  -All other medications will remain the same.    Medications: Outpatient Encounter Medications as of 12/06/2020  Medication Sig Note   Alcohol Swabs (B-D SINGLE USE SWABS REGULAR) PADS USE TO TEST BLOOD SUGAR DAILY    amiodarone (PACERONE) 200 MG tablet TAKE 1 TABLET EVERY DAY    aspirin EC 81 MG tablet Take 81 mg by mouth daily.    Blood Glucose Calibration (TRUE METRIX LEVEL 1) Low SOLN USE TO TEST BLOOD SUGAR DAILY    Blood Glucose Monitoring Suppl (TRUE METRIX AIR GLUCOSE METER) w/Device KIT 1 Device by  Does not apply route daily.    Blood Pressure Monitoring (BLOOD PRESSURE MONITOR  AUTOMAT) DEVI To check BP daily    carvedilol (COREG) 3.125 MG tablet TAKE 1 TABLET (3.125 MG TOTAL) BY MOUTH 2 (TWO) TIMES DAILY WITH A MEAL. MUST KEEP PENDING APPT FOR FURTHER REFILLS    clopidogrel (PLAVIX) 75 MG tablet TAKE 1 TABLET EVERY DAY 10/05/2020: ON HOLD   clotrimazole-betamethasone (LOTRISONE) cream Apply 1 application topically daily as needed. mall amount.    finasteride (PROSCAR) 5 MG tablet TAKE 1 TABLET EVERY DAY    HYDROcodone-acetaminophen (NORCO/VICODIN) 5-325 MG tablet Take 1 tablet by mouth every 4 (four) hours as needed for moderate pain. 08/18/2020: .   insulin aspart (NOVOLOG) 100 UNIT/ML injection Inject 3 Units into the skin 3 (three) times daily with meals.    insulin glargine (LANTUS SOLOSTAR) 100 UNIT/ML Solostar Pen Inject 10 Units into the skin at bedtime.    losartan (COZAAR) 25 MG tablet TAKE 1/2 TABLET TWICE DAILY    Magnesium 500 MG TABS Take 500 mg by mouth every other day. In the morning    mirtazapine (REMERON) 15 MG tablet TAKE 1 TABLET (15 MG TOTAL) BY MOUTH AT BEDTIME.    Multiple Vitamin (MULTIVITAMIN WITH MINERALS) TABS tablet Take 1 tablet by mouth daily. One-A-Day    nystatin (MYCOSTATIN/NYSTOP) powder Apply topically 3 (three) times daily. (Patient taking differently: Apply 1 application topically 3 (three) times daily as needed (irritation).)    rosuvastatin (CRESTOR) 40 MG tablet TAKE 1 TABLET EVERY DAY    SANTYL ointment Apply 1 application topically as needed (for wound care).    SILVER NITRATE EX Apply 1 application topically daily as needed (wound care).    tamsulosin (FLOMAX) 0.4 MG CAPS capsule TAKE 1 CAPSULE EVERY DAY    torsemide (DEMADEX) 20 MG tablet TAKE 1 TABLET EVERY OTHER DAY    TRUE METRIX BLOOD GLUCOSE TEST test strip USE  AS  INSTRUCTED    TRUEplus Lancets 30G MISC USE  TO  TEST BLOOD SUGAR EVERY DAY (Patient taking differently: USE  TO  TEST BLOOD SUGAR EVERY DAY)    Vitamin A 3 MG (10000 UT) TABS Take 10,000 mcg by mouth daily.     vitamin C (ASCORBIC ACID) 500 MG tablet Take 500 mg by mouth daily.    zinc gluconate 50 MG tablet Take 50 mg by mouth daily.    No facility-administered encounter medications on file as of 12/06/2020.  Reviewed chart prior to disease state call. Spoke with patient regarding BP   Current home BP readings:  8-16 (98/76) 8-15(98/72)   Notes: Call to patient who confirmed he is taking the above medications as prescribed. He reports he is not in need of any fills at the moment. Patient reports for breakfast he has cereal. For lunch he is having a sandwich and for dinner will have a TV dinner or whatever his wife prepares. He reports he is drinking at least 2 bottles of water daily.Patient reports he is using a wheelchair and has one leg amputated. But gets around well in it. Offered CCM follow up appointment call for 02-27-2021 he accepted. He notes that he had reached out to the office about his foot care he was told that they would come to him at home after calling him to schedule he reports he has yet to receive call. Will forward to CPP for follow up with office.  Care Gaps: COVID Vaccines - Overdue Eye Exam - Overdue Hepatitis C -  Overdue Zoster Vaccine - Overdue Flu Vaccine - Overdue AWV - MSG sent to Ramond Craver CMA to schedule. CCM F/U Call - 02-27-2021 at 10 am  Star Rating Drugs: Rosuvastatin (Crestor) 40 mg - Last filled 09-13-2020 90 DS at [facility] Losartan (Cozaar) 25 mg - Last filled 09-13-2020 90 DS at [facility] Pharmacist Assistant [phone]

## 2020-12-06 NOTE — Progress Notes (Signed)
With Compression Wrap Compression Stockings Add-Ons Electronic Signature(s) Signed: 12/06/2020 5:19:53 PM By: Deon Pilling Entered By: Deon Pilling on 12/06/2020 09:41:51 -------------------------------------------------------------------------------- Vitals Details Patient Name: Date of Service: [name] [name] F. 12/06/2020 9:15 A M Medical Record Number: [id number] Patient Account Number: [id number] Date of Birth/Sex: Treating RN: [birth year] (75 y.o. [name], [name] Primary Care Aahil Fredin: [name], [name] Other Clinician: Referring Kyria Bumgardner: Treating Myeisha Kruser/Extender: [name], [name] [name], [name] Weeks in Treatment: 242 Vital Signs Time Taken: 09:15 Temperature (F): 98.5 Height (in): 74 Pulse (bpm): 80 Weight (lbs): 150 Respiratory Rate (breaths/min): 20 Body Mass Index (BMI): 19.3 Blood Pressure (mmHg): 130/70 Capillary Blood Glucose (mg/dl): 118 Reference Range: 80 - 120 mg / dl Electronic Signature(s) Signed: 12/06/2020 11:32:43 AM By: Sandre Kitty Entered By: Sandre Kitty on 12/06/2020 09:15:46  15 (g) Discharge Instruction: Use triamcinolone 15 (g) as directed Sween Lotion (Moisturizing lotion) Discharge Instruction: Apply moisturizing lotion as directed Topical Primary Dressing Hydrofera Blue Classic Foam, 4x4 in Discharge Instruction: Moisten with saline prior to applying to wound bed Secondary Dressing Woven Gauze Sponge, Non-Sterile 4x4 in Discharge Instruction: Apply over primary dressing as directed. ABD Pad, 5x9 Discharge Instruction: Apply over primary dressing as directed. Secured With Compression Wrap Kerlix Roll 4.5x3.1 (in/yd) Discharge Instruction: Apply Kerlix and Coban compression as directed. Coban Self-Adherent Wrap 4x5 (in/yd) Discharge Instruction: Apply over Kerlix as directed. Compression Stockings Add-Ons Electronic Signature(s) Signed: 12/06/2020 5:19:53 PM By: Deon Pilling Entered By: Deon Pilling on 12/06/2020 09:39:08 -------------------------------------------------------------------------------- Wound Assessment Details Patient Name: Date of Service: [name] [name] [facility] F. 12/06/2020 9:15 A M Medical Record Number: [id number] Patient Account Number: [id number] Date of Birth/Sex: Treating RN: 01-20-1946 (75 y.o. [name] [name] Primary Care Barba Solt: [name], [name] Other Clinician: Referring Mikal Wisman: Treating Shellyann Wandrey/Extender: [name], [name] [name], [name] Weeks in Treatment: 242 Wound Status Wound Number:  34 Primary Diabetic Wound/Ulcer of the Lower Extremity Etiology: Wound Location: Right, Medial, Dorsal Foot Wound Open Wounding Event: Gradually Appeared Status: Date Acquired: 06/14/2020 Comorbid Anemia, Sleep Apnea, Arrhythmia, Congestive Heart Failure, Weeks Of Treatment: 25 History: Coronary Artery Disease, Hypertension, Myocardial Infarction, Clustered Wound: No Type II Diabetes, Gout, Neuropathy Photos Wound Measurements Length: (cm) 1.7 Width: (cm) 1.6 Depth: (cm) 0.7 Area: (cm) 2.136 Volume: (cm) 1.495 % Reduction in Area: -676.7% % Reduction in Volume: -5437% Epithelialization: None Tunneling: No Undermining: No Wound Description Classification: Grade 2 Wound Margin: Epibole Exudate Amount: Medium Exudate Type: Purulent Exudate Color: yellow, brown, green Foul Odor After Cleansing: No Slough/Fibrino Yes Wound Bed Granulation Amount: Small (1-33%) Exposed Structure Granulation Quality: Red Fascia Exposed: No Necrotic Amount: Large (67-100%) Fat Layer (Subcutaneous Tissue) Exposed: Yes Necrotic Quality: Adherent Slough Tendon Exposed: Yes Muscle Exposed: No Joint Exposed: No Bone Exposed: Yes Treatment Notes Wound #34 (Foot) Wound Laterality: Dorsal, Right, Medial Cleanser Wound Cleanser Discharge Instruction: Cleanse the wound with wound cleanser prior to applying a clean dressing using gauze sponges, not tissue or cotton balls. Soap and Water Discharge Instruction: May shower and wash wound with dial antibacterial soap and water prior to dressing change. Peri-Wound Care Triamcinolone 15 (g) Discharge Instruction: Use triamcinolone 15 (g) as directed Sween Lotion (Moisturizing lotion) Discharge Instruction: Apply moisturizing lotion as directed Topical Primary Dressing Hydrofera Blue Classic Foam, 4x4 in Discharge Instruction: Moisten with saline prior to applying to wound bed Secondary Dressing Woven Gauze Sponge, Non-Sterile 4x4 in Discharge  Instruction: Apply over primary dressing as directed. ABD Pad, 5x9 Discharge Instruction: Apply over primary dressing as directed. Secured With Compression Wrap Kerlix Roll 4.5x3.1 (in/yd) Discharge Instruction: Apply Kerlix and Coban compression as directed. Coban Self-Adherent Wrap 4x5 (in/yd) Discharge Instruction: Apply over Kerlix as directed. Compression Stockings Add-Ons Electronic Signature(s) Signed: 12/06/2020 5:19:53 PM By: Deon Pilling Entered By: Deon Pilling on 12/06/2020 09:39:45 -------------------------------------------------------------------------------- Wound Assessment Details Patient Name: Date of Service: [name] [name] [facility] F. 12/06/2020 9:15 A M Medical Record Number: [id number] Patient Account Number: [id number] Date of Birth/Sex: Treating RN: [birth year] (75 y.o. [name] [name] Primary Care Dolan Xia: [name], [name] Other Clinician: Referring Romari Gasparro: Treating Jolynn Bajorek/Extender: [name], [name] [name], [name] Weeks in Treatment: 242 Wound Status Wound Number: 39 Primary Pressure Ulcer Etiology: Wound Location: Right Calcaneus Secondary Arterial Insufficiency Ulcer Wounding Event: Pressure Injury Etiology: Date Acquired: 11/15/2020 Wound Open Weeks Of Treatment: 3 Status: Clustered Wound: No Comorbid Anemia, Sleep Apnea, Arrhythmia,  Record Number: [id number] Patient Account Number: [id number] Date of Birth/Sex: Treating RN: 03-Apr-1946 (75 y.o. [name] [name] Primary Care Avon Mergenthaler: [name], [name] Other Clinician: Referring Oak Dorey: Treating Khaalid Lefkowitz/Extender: [name], [name] [name], [name] Weeks in Treatment: [place] reviewed with physician Active  Inactive Wound/Skin Impairment Nursing Diagnoses: Impaired tissue integrity Goals: Patient/caregiver will verbalize understanding of skin care regimen Date Initiated: 11/19/2017 Target Resolution Date: 12/23/2020 Goal Status: Active Ulcer/skin breakdown will have a volume reduction of 30% by week 4 Date Initiated: 11/19/2017 Date Inactivated: 12/02/2017 Target Resolution Date: 12/22/2017 Goal Status: Unmet Unmet Reason: multipl comorbidities Interventions: Assess patient/caregiver ability to obtain necessary supplies Assess ulceration(s) every visit Provide education on ulcer and skin care Notes: Electronic Signature(s) Signed: 12/06/2020 5:19:53 PM By: Deon Pilling Entered By: Deon Pilling on 12/06/2020 09:42:12 -------------------------------------------------------------------------------- Pain Assessment Details Patient Name: Date of Service: [name] [name] [facility] F. 12/06/2020 9:15 A M Medical Record Number: [id number] Patient Account Number: [id number] Date of Birth/Sex: Treating RN: November 27, 1945 (75 y.o. [name], [name] Primary Care Abdou Stocks: [name], [name] Other Clinician: Referring Leiloni Smithers: Treating Robben Jagiello/Extender: [name], [name] [name], [name] Weeks in Treatment: 242 Active Problems Location of Pain Severity and Description of Pain Patient Has Paino No Site Locations Pain Management and Medication Current Pain Management: Electronic Signature(s) Signed: 12/06/2020 11:32:43 AM By: Sandre Kitty Signed: 12/06/2020 5:16:57 PM By: [name] Entered By: Sandre Kitty on 12/06/2020 09:15:55 -------------------------------------------------------------------------------- Patient/Caregiver Education Details Patient Name: Date of Service: [name] [name] 8/16/2022andnbsp9:15 A M Medical Record Number: [id number] Patient Account Number: [id number] Date of Birth/Gender: Treating RN: [birth year] (75 y.o. [name] [name] Primary Care Physician: [name],  [name] Other Clinician: Referring Physician: Treating Physician/Extender: [name], [name] [name], [name] Weeks in Treatment: [phone] Education Assessment Education Provided To: Patient Education Topics Provided Wound/Skin Impairment: Methods: Explain/Verbal Responses: Reinforcements needed Electronic Signature(s) Signed: 12/06/2020 5:19:53 PM By: Deon Pilling Entered By: Deon Pilling on 12/06/2020 09:42:28 -------------------------------------------------------------------------------- Wound Assessment Details Patient Name: Date of Service: [name] [name] F. 12/06/2020 9:15 A M Medical Record Number: [id number] Patient Account Number: [id number] Date of Birth/Sex: Treating RN: 09/11/1945 (75 y.o. [name] [name] Primary Care Marcellus Pulliam: [name], [name] Other Clinician: Referring Echo Propp: Treating Trulee Hamstra/Extender: [name], [name] [name], [name] Weeks in Treatment: 242 Wound Status Wound Number: 25 Primary Venous Leg Ulcer Etiology: Wound Location: Right, Lateral Lower Leg Wound Open Wounding Event: Gradually Appeared Status: Date Acquired: 07/06/2019 Comorbid Anemia, Sleep Apnea, Arrhythmia, Congestive Heart Failure, Weeks Of Treatment: 72 History: Coronary Artery Disease, Hypertension, Myocardial Infarction, Clustered Wound: No Type II Diabetes, Gout, Neuropathy Photos Wound Measurements Length: (cm) 4.9 Width: (cm) 1.6 Depth: (cm) 0.1 Area: (cm) 6.158 Volume: (cm) 0.616 % Reduction in Area: -125.3% % Reduction in Volume: -125.6% Epithelialization: None Tunneling: No Undermining: No Wound Description Classification: Full Thickness Without Exposed Support Structures Wound Margin: Flat and Intact Exudate Amount: Large Exudate Type: Purulent Exudate Color: yellow, brown, green Foul Odor After Cleansing: No Slough/Fibrino Yes Wound Bed Granulation Amount: None Present (0%) Exposed Structure Necrotic Amount: Large (67-100%) Fascia Exposed: No Necrotic Quality:  Adherent Slough Fat Layer (Subcutaneous Tissue) Exposed: Yes Tendon Exposed: No Muscle Exposed: No Joint Exposed: No Bone Exposed: No Treatment Notes Wound #25 (Lower Leg) Wound Laterality: Right, Lateral Cleanser Wound Cleanser Discharge Instruction: Cleanse the wound with wound cleanser prior to applying a clean dressing using gauze sponges, not tissue or cotton balls. Soap and Water Discharge Instruction: May shower and wash wound with dial antibacterial soap and water prior to dressing change. Peri-Wound Care Triamcinolone  With Compression Wrap Compression Stockings Add-Ons Electronic Signature(s) Signed: 12/06/2020 5:19:53 PM By: Deon Pilling Entered By: Deon Pilling on 12/06/2020 09:41:51 -------------------------------------------------------------------------------- Vitals Details Patient Name: Date of Service: [name] [name] F. 12/06/2020 9:15 A M Medical Record Number: [id number] Patient Account Number: [id number] Date of Birth/Sex: Treating RN: [birth year] (75 y.o. [name], [name] Primary Care Aahil Fredin: [name], [name] Other Clinician: Referring Kyria Bumgardner: Treating Myeisha Kruser/Extender: [name], [name] [name], [name] Weeks in Treatment: 242 Vital Signs Time Taken: 09:15 Temperature (F): 98.5 Height (in): 74 Pulse (bpm): 80 Weight (lbs): 150 Respiratory Rate (breaths/min): 20 Body Mass Index (BMI): 19.3 Blood Pressure (mmHg): 130/70 Capillary Blood Glucose (mg/dl): 118 Reference Range: 80 - 120 mg / dl Electronic Signature(s) Signed: 12/06/2020 11:32:43 AM By: Sandre Kitty Entered By: Sandre Kitty on 12/06/2020 09:15:46  15 (g) Discharge Instruction: Use triamcinolone 15 (g) as directed Sween Lotion (Moisturizing lotion) Discharge Instruction: Apply moisturizing lotion as directed Topical Primary Dressing Hydrofera Blue Classic Foam, 4x4 in Discharge Instruction: Moisten with saline prior to applying to wound bed Secondary Dressing Woven Gauze Sponge, Non-Sterile 4x4 in Discharge Instruction: Apply over primary dressing as directed. ABD Pad, 5x9 Discharge Instruction: Apply over primary dressing as directed. Secured With Compression Wrap Kerlix Roll 4.5x3.1 (in/yd) Discharge Instruction: Apply Kerlix and Coban compression as directed. Coban Self-Adherent Wrap 4x5 (in/yd) Discharge Instruction: Apply over Kerlix as directed. Compression Stockings Add-Ons Electronic Signature(s) Signed: 12/06/2020 5:19:53 PM By: Deon Pilling Entered By: Deon Pilling on 12/06/2020 09:39:08 -------------------------------------------------------------------------------- Wound Assessment Details Patient Name: Date of Service: [name] [name] [facility] F. 12/06/2020 9:15 A M Medical Record Number: [id number] Patient Account Number: [id number] Date of Birth/Sex: Treating RN: 01-20-1946 (75 y.o. [name] [name] Primary Care Barba Solt: [name], [name] Other Clinician: Referring Mikal Wisman: Treating Shellyann Wandrey/Extender: [name], [name] [name], [name] Weeks in Treatment: 242 Wound Status Wound Number:  34 Primary Diabetic Wound/Ulcer of the Lower Extremity Etiology: Wound Location: Right, Medial, Dorsal Foot Wound Open Wounding Event: Gradually Appeared Status: Date Acquired: 06/14/2020 Comorbid Anemia, Sleep Apnea, Arrhythmia, Congestive Heart Failure, Weeks Of Treatment: 25 History: Coronary Artery Disease, Hypertension, Myocardial Infarction, Clustered Wound: No Type II Diabetes, Gout, Neuropathy Photos Wound Measurements Length: (cm) 1.7 Width: (cm) 1.6 Depth: (cm) 0.7 Area: (cm) 2.136 Volume: (cm) 1.495 % Reduction in Area: -676.7% % Reduction in Volume: -5437% Epithelialization: None Tunneling: No Undermining: No Wound Description Classification: Grade 2 Wound Margin: Epibole Exudate Amount: Medium Exudate Type: Purulent Exudate Color: yellow, brown, green Foul Odor After Cleansing: No Slough/Fibrino Yes Wound Bed Granulation Amount: Small (1-33%) Exposed Structure Granulation Quality: Red Fascia Exposed: No Necrotic Amount: Large (67-100%) Fat Layer (Subcutaneous Tissue) Exposed: Yes Necrotic Quality: Adherent Slough Tendon Exposed: Yes Muscle Exposed: No Joint Exposed: No Bone Exposed: Yes Treatment Notes Wound #34 (Foot) Wound Laterality: Dorsal, Right, Medial Cleanser Wound Cleanser Discharge Instruction: Cleanse the wound with wound cleanser prior to applying a clean dressing using gauze sponges, not tissue or cotton balls. Soap and Water Discharge Instruction: May shower and wash wound with dial antibacterial soap and water prior to dressing change. Peri-Wound Care Triamcinolone 15 (g) Discharge Instruction: Use triamcinolone 15 (g) as directed Sween Lotion (Moisturizing lotion) Discharge Instruction: Apply moisturizing lotion as directed Topical Primary Dressing Hydrofera Blue Classic Foam, 4x4 in Discharge Instruction: Moisten with saline prior to applying to wound bed Secondary Dressing Woven Gauze Sponge, Non-Sterile 4x4 in Discharge  Instruction: Apply over primary dressing as directed. ABD Pad, 5x9 Discharge Instruction: Apply over primary dressing as directed. Secured With Compression Wrap Kerlix Roll 4.5x3.1 (in/yd) Discharge Instruction: Apply Kerlix and Coban compression as directed. Coban Self-Adherent Wrap 4x5 (in/yd) Discharge Instruction: Apply over Kerlix as directed. Compression Stockings Add-Ons Electronic Signature(s) Signed: 12/06/2020 5:19:53 PM By: Deon Pilling Entered By: Deon Pilling on 12/06/2020 09:39:45 -------------------------------------------------------------------------------- Wound Assessment Details Patient Name: Date of Service: [name] [name] [facility] F. 12/06/2020 9:15 A M Medical Record Number: [id number] Patient Account Number: [id number] Date of Birth/Sex: Treating RN: [birth year] (75 y.o. [name] [name] Primary Care Dolan Xia: [name], [name] Other Clinician: Referring Romari Gasparro: Treating Jolynn Bajorek/Extender: [name], [name] [name], [name] Weeks in Treatment: 242 Wound Status Wound Number: 39 Primary Pressure Ulcer Etiology: Wound Location: Right Calcaneus Secondary Arterial Insufficiency Ulcer Wounding Event: Pressure Injury Etiology: Date Acquired: 11/15/2020 Wound Open Weeks Of Treatment: 3 Status: Clustered Wound: No Comorbid Anemia, Sleep Apnea, Arrhythmia,  15 (g) Discharge Instruction: Use triamcinolone 15 (g) as directed Sween Lotion (Moisturizing lotion) Discharge Instruction: Apply moisturizing lotion as directed Topical Primary Dressing Hydrofera Blue Classic Foam, 4x4 in Discharge Instruction: Moisten with saline prior to applying to wound bed Secondary Dressing Woven Gauze Sponge, Non-Sterile 4x4 in Discharge Instruction: Apply over primary dressing as directed. ABD Pad, 5x9 Discharge Instruction: Apply over primary dressing as directed. Secured With Compression Wrap Kerlix Roll 4.5x3.1 (in/yd) Discharge Instruction: Apply Kerlix and Coban compression as directed. Coban Self-Adherent Wrap 4x5 (in/yd) Discharge Instruction: Apply over Kerlix as directed. Compression Stockings Add-Ons Electronic Signature(s) Signed: 12/06/2020 5:19:53 PM By: Deon Pilling Entered By: Deon Pilling on 12/06/2020 09:39:08 -------------------------------------------------------------------------------- Wound Assessment Details Patient Name: Date of Service: [name] [name] [facility] F. 12/06/2020 9:15 A M Medical Record Number: [id number] Patient Account Number: [id number] Date of Birth/Sex: Treating RN: 01-20-1946 (75 y.o. [name] [name] Primary Care Barba Solt: [name], [name] Other Clinician: Referring Mikal Wisman: Treating Shellyann Wandrey/Extender: [name], [name] [name], [name] Weeks in Treatment: 242 Wound Status Wound Number:  34 Primary Diabetic Wound/Ulcer of the Lower Extremity Etiology: Wound Location: Right, Medial, Dorsal Foot Wound Open Wounding Event: Gradually Appeared Status: Date Acquired: 06/14/2020 Comorbid Anemia, Sleep Apnea, Arrhythmia, Congestive Heart Failure, Weeks Of Treatment: 25 History: Coronary Artery Disease, Hypertension, Myocardial Infarction, Clustered Wound: No Type II Diabetes, Gout, Neuropathy Photos Wound Measurements Length: (cm) 1.7 Width: (cm) 1.6 Depth: (cm) 0.7 Area: (cm) 2.136 Volume: (cm) 1.495 % Reduction in Area: -676.7% % Reduction in Volume: -5437% Epithelialization: None Tunneling: No Undermining: No Wound Description Classification: Grade 2 Wound Margin: Epibole Exudate Amount: Medium Exudate Type: Purulent Exudate Color: yellow, brown, green Foul Odor After Cleansing: No Slough/Fibrino Yes Wound Bed Granulation Amount: Small (1-33%) Exposed Structure Granulation Quality: Red Fascia Exposed: No Necrotic Amount: Large (67-100%) Fat Layer (Subcutaneous Tissue) Exposed: Yes Necrotic Quality: Adherent Slough Tendon Exposed: Yes Muscle Exposed: No Joint Exposed: No Bone Exposed: Yes Treatment Notes Wound #34 (Foot) Wound Laterality: Dorsal, Right, Medial Cleanser Wound Cleanser Discharge Instruction: Cleanse the wound with wound cleanser prior to applying a clean dressing using gauze sponges, not tissue or cotton balls. Soap and Water Discharge Instruction: May shower and wash wound with dial antibacterial soap and water prior to dressing change. Peri-Wound Care Triamcinolone 15 (g) Discharge Instruction: Use triamcinolone 15 (g) as directed Sween Lotion (Moisturizing lotion) Discharge Instruction: Apply moisturizing lotion as directed Topical Primary Dressing Hydrofera Blue Classic Foam, 4x4 in Discharge Instruction: Moisten with saline prior to applying to wound bed Secondary Dressing Woven Gauze Sponge, Non-Sterile 4x4 in Discharge  Instruction: Apply over primary dressing as directed. ABD Pad, 5x9 Discharge Instruction: Apply over primary dressing as directed. Secured With Compression Wrap Kerlix Roll 4.5x3.1 (in/yd) Discharge Instruction: Apply Kerlix and Coban compression as directed. Coban Self-Adherent Wrap 4x5 (in/yd) Discharge Instruction: Apply over Kerlix as directed. Compression Stockings Add-Ons Electronic Signature(s) Signed: 12/06/2020 5:19:53 PM By: Deon Pilling Entered By: Deon Pilling on 12/06/2020 09:39:45 -------------------------------------------------------------------------------- Wound Assessment Details Patient Name: Date of Service: [name] [name] [facility] F. 12/06/2020 9:15 A M Medical Record Number: [id number] Patient Account Number: [id number] Date of Birth/Sex: Treating RN: [birth year] (75 y.o. [name] [name] Primary Care Dolan Xia: [name], [name] Other Clinician: Referring Romari Gasparro: Treating Jolynn Bajorek/Extender: [name], [name] [name], [name] Weeks in Treatment: 242 Wound Status Wound Number: 39 Primary Pressure Ulcer Etiology: Wound Location: Right Calcaneus Secondary Arterial Insufficiency Ulcer Wounding Event: Pressure Injury Etiology: Date Acquired: 11/15/2020 Wound Open Weeks Of Treatment: 3 Status: Clustered Wound: No Comorbid Anemia, Sleep Apnea, Arrhythmia,  Record Number: [id number] Patient Account Number: [id number] Date of Birth/Sex: Treating RN: 03-Apr-1946 (75 y.o. [name] [name] Primary Care Avon Mergenthaler: [name], [name] Other Clinician: Referring Oak Dorey: Treating Khaalid Lefkowitz/Extender: [name], [name] [name], [name] Weeks in Treatment: [place] reviewed with physician Active  Inactive Wound/Skin Impairment Nursing Diagnoses: Impaired tissue integrity Goals: Patient/caregiver will verbalize understanding of skin care regimen Date Initiated: 11/19/2017 Target Resolution Date: 12/23/2020 Goal Status: Active Ulcer/skin breakdown will have a volume reduction of 30% by week 4 Date Initiated: 11/19/2017 Date Inactivated: 12/02/2017 Target Resolution Date: 12/22/2017 Goal Status: Unmet Unmet Reason: multipl comorbidities Interventions: Assess patient/caregiver ability to obtain necessary supplies Assess ulceration(s) every visit Provide education on ulcer and skin care Notes: Electronic Signature(s) Signed: 12/06/2020 5:19:53 PM By: Deon Pilling Entered By: Deon Pilling on 12/06/2020 09:42:12 -------------------------------------------------------------------------------- Pain Assessment Details Patient Name: Date of Service: [name] [name] [facility] F. 12/06/2020 9:15 A M Medical Record Number: [id number] Patient Account Number: [id number] Date of Birth/Sex: Treating RN: November 27, 1945 (75 y.o. [name], [name] Primary Care Abdou Stocks: [name], [name] Other Clinician: Referring Leiloni Smithers: Treating Robben Jagiello/Extender: [name], [name] [name], [name] Weeks in Treatment: 242 Active Problems Location of Pain Severity and Description of Pain Patient Has Paino No Site Locations Pain Management and Medication Current Pain Management: Electronic Signature(s) Signed: 12/06/2020 11:32:43 AM By: Sandre Kitty Signed: 12/06/2020 5:16:57 PM By: [name] Entered By: Sandre Kitty on 12/06/2020 09:15:55 -------------------------------------------------------------------------------- Patient/Caregiver Education Details Patient Name: Date of Service: [name] [name] 8/16/2022andnbsp9:15 A M Medical Record Number: [id number] Patient Account Number: [id number] Date of Birth/Gender: Treating RN: [birth year] (75 y.o. [name] [name] Primary Care Physician: [name],  [name] Other Clinician: Referring Physician: Treating Physician/Extender: [name], [name] [name], [name] Weeks in Treatment: [phone] Education Assessment Education Provided To: Patient Education Topics Provided Wound/Skin Impairment: Methods: Explain/Verbal Responses: Reinforcements needed Electronic Signature(s) Signed: 12/06/2020 5:19:53 PM By: Deon Pilling Entered By: Deon Pilling on 12/06/2020 09:42:28 -------------------------------------------------------------------------------- Wound Assessment Details Patient Name: Date of Service: [name] [name] F. 12/06/2020 9:15 A M Medical Record Number: [id number] Patient Account Number: [id number] Date of Birth/Sex: Treating RN: 09/11/1945 (75 y.o. [name] [name] Primary Care Marcellus Pulliam: [name], [name] Other Clinician: Referring Echo Propp: Treating Trulee Hamstra/Extender: [name], [name] [name], [name] Weeks in Treatment: 242 Wound Status Wound Number: 25 Primary Venous Leg Ulcer Etiology: Wound Location: Right, Lateral Lower Leg Wound Open Wounding Event: Gradually Appeared Status: Date Acquired: 07/06/2019 Comorbid Anemia, Sleep Apnea, Arrhythmia, Congestive Heart Failure, Weeks Of Treatment: 72 History: Coronary Artery Disease, Hypertension, Myocardial Infarction, Clustered Wound: No Type II Diabetes, Gout, Neuropathy Photos Wound Measurements Length: (cm) 4.9 Width: (cm) 1.6 Depth: (cm) 0.1 Area: (cm) 6.158 Volume: (cm) 0.616 % Reduction in Area: -125.3% % Reduction in Volume: -125.6% Epithelialization: None Tunneling: No Undermining: No Wound Description Classification: Full Thickness Without Exposed Support Structures Wound Margin: Flat and Intact Exudate Amount: Large Exudate Type: Purulent Exudate Color: yellow, brown, green Foul Odor After Cleansing: No Slough/Fibrino Yes Wound Bed Granulation Amount: None Present (0%) Exposed Structure Necrotic Amount: Large (67-100%) Fascia Exposed: No Necrotic Quality:  Adherent Slough Fat Layer (Subcutaneous Tissue) Exposed: Yes Tendon Exposed: No Muscle Exposed: No Joint Exposed: No Bone Exposed: No Treatment Notes Wound #25 (Lower Leg) Wound Laterality: Right, Lateral Cleanser Wound Cleanser Discharge Instruction: Cleanse the wound with wound cleanser prior to applying a clean dressing using gauze sponges, not tissue or cotton balls. Soap and Water Discharge Instruction: May shower and wash wound with dial antibacterial soap and water prior to dressing change. Peri-Wound Care Triamcinolone

## 2020-12-06 NOTE — Progress Notes (Signed)
[name] [name], [name] [name] ([id number]) Visit Report for 12/06/2020 Debridement Details Patient Name: Date of Service: [name] [name] [facility] F. 12/06/2020 9:15 A M Medical Record Number: [id number] Patient Account Number: [id number] Date of Birth/Sex: Treating RN: Jul 31, 1945 (75 y.o. [name] [name], [name] [name] Primary Care Provider: [name], [name] Other Clinician: Referring Provider: Treating Provider/Extender: [name] [name] [name] [name] Weeks in Treatment: 242 Debridement Performed for Assessment: Wound #34 Right,Medial,Dorsal Foot Performed By: Physician [name] [name]., MD Debridement Type: Debridement Severity of Tissue Pre Debridement: Fat layer exposed Level of Consciousness (Pre-procedure): Awake and Alert Pre-procedure Verification/Time Out Yes - 09:50 Taken: Start Time: 09:51 Pain Control: Lidocaine 4% T opical Solution T Area Debrided (L x W): otal 1.7 (cm) x 1.6 (cm) = 2.72 (cm) Tissue and other material debrided: Viable, Non-Viable, Slough, Subcutaneous, Tendon, Slough Level: Skin/Subcutaneous Tissue/Muscle Debridement Description: Excisional Instrument: Curette, Forceps, Scissors Bleeding: Minimum Hemostasis Achieved: Silver Nitrate End Time: 09:53 Procedural Pain: 0 Post Procedural Pain: 0 Response to Treatment: Procedure was tolerated well Level of Consciousness (Post- Awake and Alert procedure): Post Debridement Measurements of Total Wound Length: (cm) 1.7 Width: (cm) 1.6 Depth: (cm) 0.7 Volume: (cm) 1.495 Character of Wound/Ulcer Post Debridement: Improved Severity of Tissue Post Debridement: Fat layer exposed Post Procedure Diagnosis Same as Pre-procedure Electronic Signature(s) Signed: 12/06/2020 4:53:39 PM By: [name] Ham MD Signed: 12/06/2020 5:19:53 PM By: [name] [name] Entered By: [name] [name] on 12/06/2020 10:20:07 -------------------------------------------------------------------------------- HPI Details Patient Name: Date of Service: [name] [name]  F. 12/06/2020 9:15 A M Medical Record Number: [id number] Patient Account Number: [id number] Date of Birth/Sex: Treating RN: 1945-06-27 (75 y.o. [name] [name] Primary Care Provider: [name], [name] Other Clinician: Referring Provider: Treating Provider/Extender: [name] [name] [name] [name] Weeks in Treatment: 242 History of Present Illness Location: On the left and right lateral forefoot which has been there for about 6 months Quality: Patient reports No Pain. Severity: Patient states wound(s) are getting worse. Duration: Patient has had the wound for > 6 months prior to seeking treatment at the wound center Context: The wound would happen gradually Modifying Factors: Patient wound(s)/ulcer(s) are worsening due to :continual drainage from the wound ssociated Signs and Symptoms: Patient reports having increase discharge. A HPI Description: This patient returns after being seen here till the end of August and he was lost to follow-up. he has been quite debilitated laying in bed most of the time and his condition has deteriorated significantly. He has multiple ulcerations on the heel lateral forefoot and some of his toes. ======== Old notes: 75 year old male known to our practice when he was seen here in February and March and was lost to follow-up when he was admitted to hospital with various medical problems including coronary artery disease and a stroke. Now returns with the problem on the left forefoot where he has an ulceration and this has been there for about 6 months. most recently he was in hospital between July 6 and July 16, when he was admitted and treated for acute respiratory failure is secondary to aspiration pneumonia, large non-STEMI, ischemic cardiomyopathy with systolic and diastolic congestive heart failure with ejection fraction about 15-20%, ventricular tachycardia and has been treated with amiodarone, acute on careful up at the common new acute CVA, acute chronic kidney  disease stage III, anemia, uncontrolled diabetes mellitus with last hemoglobin A1c being 12%. He has had persistent hyperglycemia given recently. Patient has a past medical history of diabetes mellitus, hypertension, combined systolic and diastolic heart failure, peripheral neuropathy, gout, cardiomyopathy with ejection fraction of  on 12/06/2020 10:29:56 -------------------------------------------------------------------------------- Physician Orders Details Patient Name: Date of Service: [name] [name] [facility] F. 12/06/2020 9:15 A M Medical Record Number: [id number] Patient Account Number: [id number] Date of Birth/Sex: Treating RN: [birth year] (75 y.o. 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 - Dr. Dellia Nims Bathing/ Shower/ Hygiene May shower with protection but do not get wound dressing(s) wet. Edema Control - Lymphedema / SCD / Other Elevate legs to the  level of the heart or above for 30 minutes daily and/or when sitting, a frequency of: Avoid standing for long periods of time. Off-Loading Other: - multipodus boots to both feet Home Health New wound care orders this week; continue Home Health for wound care. May utilize formulary equivalent dressing for wound treatment orders unless otherwise specified. [name] [name] to change 1 time a week Wound Treatment Wound #25 - Lower Leg Wound Laterality: Right, Lateral Cleanser: Wound Cleanser (Home Health) (Generic) Every Other Day/15 Days Discharge Instructions: Cleanse the wound with wound cleanser prior to applying a clean dressing using gauze sponges, not tissue or cotton balls. Cleanser: Soap and Water [facility]) Every Other Day/15 Days Discharge Instructions: May shower and wash wound with dial antibacterial soap and water prior to dressing change. Peri-Wound Care: Triamcinolone 15 (g) (Home Health) Every Other Day/15 Days Discharge Instructions: Use triamcinolone 15 (g) as directed Peri-Wound Care: Sween Lotion (Moisturizing lotion) (Home Health) Every Other Day/15 Days Discharge Instructions: Apply moisturizing lotion as directed Prim Dressing: Hydrofera Blue Classic Foam, 4x4 in [facility]) Every Other Day/15 Days ary Discharge Instructions: Moisten with saline prior to applying to wound bed Secondary Dressing: Woven Gauze Sponge, Non-Sterile 4x4 in (Home Health) (Generic) Every Other Day/15 Days Discharge Instructions: Apply over primary dressing as directed. Secondary Dressing: ABD Pad, 5x9 (Home Health) (Generic) Every Other Day/15 Days Discharge Instructions: Apply over primary dressing as directed. Compression Wrap: Kerlix Roll 4.5x3.1 (in/yd) (Home Health) (Generic) Every Other Day/15 Days Discharge Instructions: Apply Kerlix and Coban compression as directed. Compression Wrap: Coban Self-Adherent Wrap 4x5 (in/yd) (Home Health) (Generic) Every Other Day/15 Days Discharge  Instructions: Apply over Kerlix as directed. Wound #34 - Foot Wound Laterality: Dorsal, Right, Medial Cleanser: Wound Cleanser (Home Health) (Generic) Every Other Day/15 Days Discharge Instructions: Cleanse the wound with wound cleanser prior to applying a clean dressing using gauze sponges, not tissue or cotton balls. Cleanser: Soap and Water [facility]) Every Other Day/15 Days Discharge Instructions: May shower and wash wound with dial antibacterial soap and water prior to dressing change. Peri-Wound Care: Triamcinolone 15 (g) (Home Health) Every Other Day/15 Days Discharge Instructions: Use triamcinolone 15 (g) as directed Peri-Wound Care: Sween Lotion (Moisturizing lotion) (Home Health) Every Other Day/15 Days Discharge Instructions: Apply moisturizing lotion as directed Prim Dressing: Hydrofera Blue Classic Foam, 4x4 in [facility]) Every Other Day/15 Days ary Discharge Instructions: Moisten with saline prior to applying to wound bed Secondary Dressing: Woven Gauze Sponge, Non-Sterile 4x4 in (Home Health) (Generic) Every Other Day/15 Days Discharge Instructions: Apply over primary dressing as directed.  of left ankle limited to breakdown of skin 07/01/2018 07/01/2018 L03.032 Cellulitis of left toe 01/06/2019 01/06/2019 B14.782 Pressure ulcer of left heel, stage 3 12/04/2016 12/04/2016 I25.119 Atherosclerotic heart disease of native coronary artery with unspecified angina 04/17/2016 04/17/2016 pectoris L97.821 Non-pressure  chronic ulcer of other part of left lower leg limited to breakdown of skin 01/06/2019 01/06/2019 N56.21 Acute diastolic (congestive) heart failure 04/17/2016 04/17/2016 L97.521 Non-pressure chronic ulcer of other part of left foot limited to breakdown of skin 02/17/2019 02/17/2019 S51.811D Laceration without foreign body of right forearm, subsequent encounter 10/22/2017 10/22/2017 L03.116 Cellulitis of left lower limb 12/24/2017 12/24/2017 L89.620 Pressure ulcer of left heel, unstageable 07/21/2019 07/21/2019 L97.524 Non-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 06/14/2020 06/14/2020 L97.211 Non-pressure chronic ulcer of right calf limited to breakdown of skin 07/21/2019 07/21/2019 M86.172 Other acute osteomyelitis, left ankle and foot 08/30/2020 08/30/2020 S80.211D Abrasion, right knee, subsequent encounter 08/18/2019 08/18/2019 Resolved Problems ICD-10 Code Description Active Date Resolved Date L89.512 Pressure ulcer of right ankle, stage 2 04/17/2016 04/17/2016 L89.522 Pressure ulcer of left ankle, stage 2 04/17/2016 04/17/2016 Electronic Signature(s) Signed: 12/06/2020 4:53:39 PM By: [name] Ham MD Entered By: [name] [name] on 12/06/2020 10:28:37 -------------------------------------------------------------------------------- Progress Note Details Patient Name: Date of Service: [name] [name] F. 12/06/2020 9:15 A M Medical Record Number: [id number] Patient Account Number: [id number] Date of Birth/Sex: Treating RN: [birth year] (75 y.o. [name] [name] Primary Care Provider: [name], [name] Other Clinician: Referring Provider: Treating Provider/Extender: Mishaal Lansdale [name] [name] Weeks in Treatment: 242 Subjective History of Present Illness (HPI) The following HPI elements were documented for the patient's wound: Location: On the left and right lateral forefoot which has been there for about 6 months Quality: Patient reports No Pain. Severity: Patient states  wound(s) are getting worse. Duration: Patient has had the wound for > 6 months prior to seeking treatment at the wound center Context: The wound would happen gradually Modifying Factors: Patient wound(s)/ulcer(s) are worsening due to :continual drainage from the wound Associated Signs and Symptoms: Patient reports having increase discharge. This patient returns after being seen here till the end of August and he was lost to follow-up. he has been quite debilitated laying in bed most of the time and his condition has deteriorated significantly. He has multiple ulcerations on the heel lateral forefoot and some of his toes. ======== Old notes: 75 year old male known to our practice when he was seen here in February and March and was lost to follow-up when he was admitted to hospital with various medical problems including coronary artery disease and a stroke. Now returns with the problem on the left forefoot where he has an ulceration and this has been there for about 6 months. 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 He has had persistent hyperglycemia given recently. Patient has a past medical history of diabetes mellitus, hypertension, combined systolic and diastolic heart failure, peripheral neuropathy, gout, cardiomyopathy with ejection fraction of about 10-15%, coronary artery disease, recent ventricular fibrillation, chronic kidney disease, implantable defibrillator, sleep apnea, status post laceration repair to the left arm  eschared. He also has an area on the right posterior lateral calf which looks stable. 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 health from 09/23/2020 through 10/13/2020. He is done fairly well I think from that point of view. Infectious disease apparently recommended blood cultures because of the underlying osteomyelitis although I am really not sure what the exact clinical indication was other than the fact he has a pacemaker. He was also discovered to have a heart murmur. Finally he had listed a pressure injury on his medial sacrum during the hospitalization although his daughter assures me today that this is not open. The patient has a deep punched out area on the right dorsal foot. Fairly extensive area on the right lateral calf just above the ankle and he has an abrasion on his right dorsal knee 7/26; the patient has a deep punched out area on the right dorsal foot  proximally with exposed tendon. He also had an area on the right lateral leg. He comes in today with a new wound on the right lateral heel. We have been using silver alginate all changed to silver collagen today. I have a note from vascular 11/08/2020. He had a CT CO2 angiogram which I think was limited on the right leg. He is going to repeat this apparently for 4/17 I certainly agree with this 8/16; none of the patient's wounds looks as though it is making any progress. He has the deep punched out area on the right dorsal foot. Exposed tendon and necrotic material. I used scissors and pickups to remove superficial tendon and then a #5 curette to remove subcutaneous debris. I am still a tendon here. Nothing looks viable. NEW today he has abrasion injuries on his right patella which he says are from hitting it when he was getting in the car. I believe he has had areas here before. Area on the right lateral heel completely nonviable. Along the right lateral calf. This does not look too bad although not much different from last time. ANGIOGRAM booked for next week Electronic Signature(s) Signed: 12/06/2020 4:53:39 PM By: [name] Ham MD Entered By: [name] [name] on 12/06/2020 10:29:16 -------------------------------------------------------------------------------- Physical Exam Details Patient Name: Date of Service: [name] [name] F. 12/06/2020 9:15 A M Medical Record Number: [id number] Patient Account Number: [id number] Date of Birth/Sex: Treating RN: 07-Jan-1946 (75 y.o. [name] [name] Primary Care Provider: [name], [name] Other Clinician: Referring Provider: Treating Provider/Extender: Shantell Belongia [name] [name] Weeks in Treatment: 242 Constitutional Sitting or standing Blood Pressure is within target range for patient.. Pulse regular and within target range for patient.Marland Kitchen Respirations regular, non-labored and within target range.. Temperature is normal and within the target range for  the patient.Marland Kitchen Appears in no distress. Cardiovascular Both his popliteal pulses and his femoral pulses on the right are palpable and his posterior tibial pulses also palpable. Notes Wound exam; right dorsal foot. Completely necrotic wound. I removed tendon subcutaneous tissue. I am still not able to get to a viable surface there is some bleeding here. The area on the right lateral heel again nonviable right lateral calf looks about the same not much healing. She had new areas which are superficial abrasions on the right anterior patella which apparently were traumatic on the car door when transferring. These look superficial no evidence of infection Electronic Signature(s) Signed: 12/06/2020 4:53:39 PM By: [name] Ham MD Entered By: [name] [name]  of left ankle limited to breakdown of skin 07/01/2018 07/01/2018 L03.032 Cellulitis of left toe 01/06/2019 01/06/2019 B14.782 Pressure ulcer of left heel, stage 3 12/04/2016 12/04/2016 I25.119 Atherosclerotic heart disease of native coronary artery with unspecified angina 04/17/2016 04/17/2016 pectoris L97.821 Non-pressure  chronic ulcer of other part of left lower leg limited to breakdown of skin 01/06/2019 01/06/2019 N56.21 Acute diastolic (congestive) heart failure 04/17/2016 04/17/2016 L97.521 Non-pressure chronic ulcer of other part of left foot limited to breakdown of skin 02/17/2019 02/17/2019 S51.811D Laceration without foreign body of right forearm, subsequent encounter 10/22/2017 10/22/2017 L03.116 Cellulitis of left lower limb 12/24/2017 12/24/2017 L89.620 Pressure ulcer of left heel, unstageable 07/21/2019 07/21/2019 L97.524 Non-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 06/14/2020 06/14/2020 L97.211 Non-pressure chronic ulcer of right calf limited to breakdown of skin 07/21/2019 07/21/2019 M86.172 Other acute osteomyelitis, left ankle and foot 08/30/2020 08/30/2020 S80.211D Abrasion, right knee, subsequent encounter 08/18/2019 08/18/2019 Resolved Problems ICD-10 Code Description Active Date Resolved Date L89.512 Pressure ulcer of right ankle, stage 2 04/17/2016 04/17/2016 L89.522 Pressure ulcer of left ankle, stage 2 04/17/2016 04/17/2016 Electronic Signature(s) Signed: 12/06/2020 4:53:39 PM By: [name] Ham MD Entered By: [name] [name] on 12/06/2020 10:28:37 -------------------------------------------------------------------------------- Progress Note Details Patient Name: Date of Service: [name] [name] F. 12/06/2020 9:15 A M Medical Record Number: [id number] Patient Account Number: [id number] Date of Birth/Sex: Treating RN: [birth year] (75 y.o. [name] [name] Primary Care Provider: [name], [name] Other Clinician: Referring Provider: Treating Provider/Extender: Mishaal Lansdale [name] [name] Weeks in Treatment: 242 Subjective History of Present Illness (HPI) The following HPI elements were documented for the patient's wound: Location: On the left and right lateral forefoot which has been there for about 6 months Quality: Patient reports No Pain. Severity: Patient states  wound(s) are getting worse. Duration: Patient has had the wound for > 6 months prior to seeking treatment at the wound center Context: The wound would happen gradually Modifying Factors: Patient wound(s)/ulcer(s) are worsening due to :continual drainage from the wound Associated Signs and Symptoms: Patient reports having increase discharge. This patient returns after being seen here till the end of August and he was lost to follow-up. he has been quite debilitated laying in bed most of the time and his condition has deteriorated significantly. He has multiple ulcerations on the heel lateral forefoot and some of his toes. ======== Old notes: 75 year old male known to our practice when he was seen here in February and March and was lost to follow-up when he was admitted to hospital with various medical problems including coronary artery disease and a stroke. Now returns with the problem on the left forefoot where he has an ulceration and this has been there for about 6 months. 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 He has had persistent hyperglycemia given recently. Patient has a past medical history of diabetes mellitus, hypertension, combined systolic and diastolic heart failure, peripheral neuropathy, gout, cardiomyopathy with ejection fraction of about 10-15%, coronary artery disease, recent ventricular fibrillation, chronic kidney disease, implantable defibrillator, sleep apnea, status post laceration repair to the left arm  of left ankle limited to breakdown of skin 07/01/2018 07/01/2018 L03.032 Cellulitis of left toe 01/06/2019 01/06/2019 B14.782 Pressure ulcer of left heel, stage 3 12/04/2016 12/04/2016 I25.119 Atherosclerotic heart disease of native coronary artery with unspecified angina 04/17/2016 04/17/2016 pectoris L97.821 Non-pressure  chronic ulcer of other part of left lower leg limited to breakdown of skin 01/06/2019 01/06/2019 N56.21 Acute diastolic (congestive) heart failure 04/17/2016 04/17/2016 L97.521 Non-pressure chronic ulcer of other part of left foot limited to breakdown of skin 02/17/2019 02/17/2019 S51.811D Laceration without foreign body of right forearm, subsequent encounter 10/22/2017 10/22/2017 L03.116 Cellulitis of left lower limb 12/24/2017 12/24/2017 L89.620 Pressure ulcer of left heel, unstageable 07/21/2019 07/21/2019 L97.524 Non-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 06/14/2020 06/14/2020 L97.211 Non-pressure chronic ulcer of right calf limited to breakdown of skin 07/21/2019 07/21/2019 M86.172 Other acute osteomyelitis, left ankle and foot 08/30/2020 08/30/2020 S80.211D Abrasion, right knee, subsequent encounter 08/18/2019 08/18/2019 Resolved Problems ICD-10 Code Description Active Date Resolved Date L89.512 Pressure ulcer of right ankle, stage 2 04/17/2016 04/17/2016 L89.522 Pressure ulcer of left ankle, stage 2 04/17/2016 04/17/2016 Electronic Signature(s) Signed: 12/06/2020 4:53:39 PM By: [name] Ham MD Entered By: [name] [name] on 12/06/2020 10:28:37 -------------------------------------------------------------------------------- Progress Note Details Patient Name: Date of Service: [name] [name] F. 12/06/2020 9:15 A M Medical Record Number: [id number] Patient Account Number: [id number] Date of Birth/Sex: Treating RN: [birth year] (75 y.o. [name] [name] Primary Care Provider: [name], [name] Other Clinician: Referring Provider: Treating Provider/Extender: Mishaal Lansdale [name] [name] Weeks in Treatment: 242 Subjective History of Present Illness (HPI) The following HPI elements were documented for the patient's wound: Location: On the left and right lateral forefoot which has been there for about 6 months Quality: Patient reports No Pain. Severity: Patient states  wound(s) are getting worse. Duration: Patient has had the wound for > 6 months prior to seeking treatment at the wound center Context: The wound would happen gradually Modifying Factors: Patient wound(s)/ulcer(s) are worsening due to :continual drainage from the wound Associated Signs and Symptoms: Patient reports having increase discharge. This patient returns after being seen here till the end of August and he was lost to follow-up. he has been quite debilitated laying in bed most of the time and his condition has deteriorated significantly. He has multiple ulcerations on the heel lateral forefoot and some of his toes. ======== Old notes: 75 year old male known to our practice when he was seen here in February and March and was lost to follow-up when he was admitted to hospital with various medical problems including coronary artery disease and a stroke. Now returns with the problem on the left forefoot where he has an ulceration and this has been there for about 6 months. 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 He has had persistent hyperglycemia given recently. Patient has a past medical history of diabetes mellitus, hypertension, combined systolic and diastolic heart failure, peripheral neuropathy, gout, cardiomyopathy with ejection fraction of about 10-15%, coronary artery disease, recent ventricular fibrillation, chronic kidney disease, implantable defibrillator, sleep apnea, status post laceration repair to the left arm  on 12/06/2020 10:29:56 -------------------------------------------------------------------------------- Physician Orders Details Patient Name: Date of Service: [name] [name] [facility] F. 12/06/2020 9:15 A M Medical Record Number: [id number] Patient Account Number: [id number] Date of Birth/Sex: Treating RN: [birth year] (75 y.o. 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 - Dr. Dellia Nims Bathing/ Shower/ Hygiene May shower with protection but do not get wound dressing(s) wet. Edema Control - Lymphedema / SCD / Other Elevate legs to the  level of the heart or above for 30 minutes daily and/or when sitting, a frequency of: Avoid standing for long periods of time. Off-Loading Other: - multipodus boots to both feet Home Health New wound care orders this week; continue Home Health for wound care. May utilize formulary equivalent dressing for wound treatment orders unless otherwise specified. [name] [name] to change 1 time a week Wound Treatment Wound #25 - Lower Leg Wound Laterality: Right, Lateral Cleanser: Wound Cleanser (Home Health) (Generic) Every Other Day/15 Days Discharge Instructions: Cleanse the wound with wound cleanser prior to applying a clean dressing using gauze sponges, not tissue or cotton balls. Cleanser: Soap and Water [facility]) Every Other Day/15 Days Discharge Instructions: May shower and wash wound with dial antibacterial soap and water prior to dressing change. Peri-Wound Care: Triamcinolone 15 (g) (Home Health) Every Other Day/15 Days Discharge Instructions: Use triamcinolone 15 (g) as directed Peri-Wound Care: Sween Lotion (Moisturizing lotion) (Home Health) Every Other Day/15 Days Discharge Instructions: Apply moisturizing lotion as directed Prim Dressing: Hydrofera Blue Classic Foam, 4x4 in [facility]) Every Other Day/15 Days ary Discharge Instructions: Moisten with saline prior to applying to wound bed Secondary Dressing: Woven Gauze Sponge, Non-Sterile 4x4 in (Home Health) (Generic) Every Other Day/15 Days Discharge Instructions: Apply over primary dressing as directed. Secondary Dressing: ABD Pad, 5x9 (Home Health) (Generic) Every Other Day/15 Days Discharge Instructions: Apply over primary dressing as directed. Compression Wrap: Kerlix Roll 4.5x3.1 (in/yd) (Home Health) (Generic) Every Other Day/15 Days Discharge Instructions: Apply Kerlix and Coban compression as directed. Compression Wrap: Coban Self-Adherent Wrap 4x5 (in/yd) (Home Health) (Generic) Every Other Day/15 Days Discharge  Instructions: Apply over Kerlix as directed. Wound #34 - Foot Wound Laterality: Dorsal, Right, Medial Cleanser: Wound Cleanser (Home Health) (Generic) Every Other Day/15 Days Discharge Instructions: Cleanse the wound with wound cleanser prior to applying a clean dressing using gauze sponges, not tissue or cotton balls. Cleanser: Soap and Water [facility]) Every Other Day/15 Days Discharge Instructions: May shower and wash wound with dial antibacterial soap and water prior to dressing change. Peri-Wound Care: Triamcinolone 15 (g) (Home Health) Every Other Day/15 Days Discharge Instructions: Use triamcinolone 15 (g) as directed Peri-Wound Care: Sween Lotion (Moisturizing lotion) (Home Health) Every Other Day/15 Days Discharge Instructions: Apply moisturizing lotion as directed Prim Dressing: Hydrofera Blue Classic Foam, 4x4 in [facility]) Every Other Day/15 Days ary Discharge Instructions: Moisten with saline prior to applying to wound bed Secondary Dressing: Woven Gauze Sponge, Non-Sterile 4x4 in (Home Health) (Generic) Every Other Day/15 Days Discharge Instructions: Apply over primary dressing as directed.  eschared. He also has an area on the right posterior lateral calf which looks stable. 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 health from 09/23/2020 through 10/13/2020. He is done fairly well I think from that point of view. Infectious disease apparently recommended blood cultures because of the underlying osteomyelitis although I am really not sure what the exact clinical indication was other than the fact he has a pacemaker. He was also discovered to have a heart murmur. Finally he had listed a pressure injury on his medial sacrum during the hospitalization although his daughter assures me today that this is not open. The patient has a deep punched out area on the right dorsal foot. Fairly extensive area on the right lateral calf just above the ankle and he has an abrasion on his right dorsal knee 7/26; the patient has a deep punched out area on the right dorsal foot  proximally with exposed tendon. He also had an area on the right lateral leg. He comes in today with a new wound on the right lateral heel. We have been using silver alginate all changed to silver collagen today. I have a note from vascular 11/08/2020. He had a CT CO2 angiogram which I think was limited on the right leg. He is going to repeat this apparently for 4/17 I certainly agree with this 8/16; none of the patient's wounds looks as though it is making any progress. He has the deep punched out area on the right dorsal foot. Exposed tendon and necrotic material. I used scissors and pickups to remove superficial tendon and then a #5 curette to remove subcutaneous debris. I am still a tendon here. Nothing looks viable. NEW today he has abrasion injuries on his right patella which he says are from hitting it when he was getting in the car. I believe he has had areas here before. Area on the right lateral heel completely nonviable. Along the right lateral calf. This does not look too bad although not much different from last time. ANGIOGRAM booked for next week Electronic Signature(s) Signed: 12/06/2020 4:53:39 PM By: [name] Ham MD Entered By: [name] [name] on 12/06/2020 10:29:16 -------------------------------------------------------------------------------- Physical Exam Details Patient Name: Date of Service: [name] [name] F. 12/06/2020 9:15 A M Medical Record Number: [id number] Patient Account Number: [id number] Date of Birth/Sex: Treating RN: 07-Jan-1946 (75 y.o. [name] [name] Primary Care Provider: [name], [name] Other Clinician: Referring Provider: Treating Provider/Extender: Shantell Belongia [name] [name] Weeks in Treatment: 242 Constitutional Sitting or standing Blood Pressure is within target range for patient.. Pulse regular and within target range for patient.Marland Kitchen Respirations regular, non-labored and within target range.. Temperature is normal and within the target range for  the patient.Marland Kitchen Appears in no distress. Cardiovascular Both his popliteal pulses and his femoral pulses on the right are palpable and his posterior tibial pulses also palpable. Notes Wound exam; right dorsal foot. Completely necrotic wound. I removed tendon subcutaneous tissue. I am still not able to get to a viable surface there is some bleeding here. The area on the right lateral heel again nonviable right lateral calf looks about the same not much healing. She had new areas which are superficial abrasions on the right anterior patella which apparently were traumatic on the car door when transferring. These look superficial no evidence of infection Electronic Signature(s) Signed: 12/06/2020 4:53:39 PM By: [name] Ham MD Entered By: [name] [name]  of left ankle limited to breakdown of skin 07/01/2018 07/01/2018 L03.032 Cellulitis of left toe 01/06/2019 01/06/2019 B14.782 Pressure ulcer of left heel, stage 3 12/04/2016 12/04/2016 I25.119 Atherosclerotic heart disease of native coronary artery with unspecified angina 04/17/2016 04/17/2016 pectoris L97.821 Non-pressure  chronic ulcer of other part of left lower leg limited to breakdown of skin 01/06/2019 01/06/2019 N56.21 Acute diastolic (congestive) heart failure 04/17/2016 04/17/2016 L97.521 Non-pressure chronic ulcer of other part of left foot limited to breakdown of skin 02/17/2019 02/17/2019 S51.811D Laceration without foreign body of right forearm, subsequent encounter 10/22/2017 10/22/2017 L03.116 Cellulitis of left lower limb 12/24/2017 12/24/2017 L89.620 Pressure ulcer of left heel, unstageable 07/21/2019 07/21/2019 L97.524 Non-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 06/14/2020 06/14/2020 L97.211 Non-pressure chronic ulcer of right calf limited to breakdown of skin 07/21/2019 07/21/2019 M86.172 Other acute osteomyelitis, left ankle and foot 08/30/2020 08/30/2020 S80.211D Abrasion, right knee, subsequent encounter 08/18/2019 08/18/2019 Resolved Problems ICD-10 Code Description Active Date Resolved Date L89.512 Pressure ulcer of right ankle, stage 2 04/17/2016 04/17/2016 L89.522 Pressure ulcer of left ankle, stage 2 04/17/2016 04/17/2016 Electronic Signature(s) Signed: 12/06/2020 4:53:39 PM By: [name] Ham MD Entered By: [name] [name] on 12/06/2020 10:28:37 -------------------------------------------------------------------------------- Progress Note Details Patient Name: Date of Service: [name] [name] F. 12/06/2020 9:15 A M Medical Record Number: [id number] Patient Account Number: [id number] Date of Birth/Sex: Treating RN: [birth year] (75 y.o. [name] [name] Primary Care Provider: [name], [name] Other Clinician: Referring Provider: Treating Provider/Extender: Mishaal Lansdale [name] [name] Weeks in Treatment: 242 Subjective History of Present Illness (HPI) The following HPI elements were documented for the patient's wound: Location: On the left and right lateral forefoot which has been there for about 6 months Quality: Patient reports No Pain. Severity: Patient states  wound(s) are getting worse. Duration: Patient has had the wound for > 6 months prior to seeking treatment at the wound center Context: The wound would happen gradually Modifying Factors: Patient wound(s)/ulcer(s) are worsening due to :continual drainage from the wound Associated Signs and Symptoms: Patient reports having increase discharge. This patient returns after being seen here till the end of August and he was lost to follow-up. he has been quite debilitated laying in bed most of the time and his condition has deteriorated significantly. He has multiple ulcerations on the heel lateral forefoot and some of his toes. ======== Old notes: 75 year old male known to our practice when he was seen here in February and March and was lost to follow-up when he was admitted to hospital with various medical problems including coronary artery disease and a stroke. Now returns with the problem on the left forefoot where he has an ulceration and this has been there for about 6 months. 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 He has had persistent hyperglycemia given recently. Patient has a past medical history of diabetes mellitus, hypertension, combined systolic and diastolic heart failure, peripheral neuropathy, gout, cardiomyopathy with ejection fraction of about 10-15%, coronary artery disease, recent ventricular fibrillation, chronic kidney disease, implantable defibrillator, sleep apnea, status post laceration repair to the left arm  on 12/06/2020 10:29:56 -------------------------------------------------------------------------------- Physician Orders Details Patient Name: Date of Service: [name] [name] [facility] F. 12/06/2020 9:15 A M Medical Record Number: [id number] Patient Account Number: [id number] Date of Birth/Sex: Treating RN: [birth year] (75 y.o. 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 - Dr. Dellia Nims Bathing/ Shower/ Hygiene May shower with protection but do not get wound dressing(s) wet. Edema Control - Lymphedema / SCD / Other Elevate legs to the  level of the heart or above for 30 minutes daily and/or when sitting, a frequency of: Avoid standing for long periods of time. Off-Loading Other: - multipodus boots to both feet Home Health New wound care orders this week; continue Home Health for wound care. May utilize formulary equivalent dressing for wound treatment orders unless otherwise specified. [name] [name] to change 1 time a week Wound Treatment Wound #25 - Lower Leg Wound Laterality: Right, Lateral Cleanser: Wound Cleanser (Home Health) (Generic) Every Other Day/15 Days Discharge Instructions: Cleanse the wound with wound cleanser prior to applying a clean dressing using gauze sponges, not tissue or cotton balls. Cleanser: Soap and Water [facility]) Every Other Day/15 Days Discharge Instructions: May shower and wash wound with dial antibacterial soap and water prior to dressing change. Peri-Wound Care: Triamcinolone 15 (g) (Home Health) Every Other Day/15 Days Discharge Instructions: Use triamcinolone 15 (g) as directed Peri-Wound Care: Sween Lotion (Moisturizing lotion) (Home Health) Every Other Day/15 Days Discharge Instructions: Apply moisturizing lotion as directed Prim Dressing: Hydrofera Blue Classic Foam, 4x4 in [facility]) Every Other Day/15 Days ary Discharge Instructions: Moisten with saline prior to applying to wound bed Secondary Dressing: Woven Gauze Sponge, Non-Sterile 4x4 in (Home Health) (Generic) Every Other Day/15 Days Discharge Instructions: Apply over primary dressing as directed. Secondary Dressing: ABD Pad, 5x9 (Home Health) (Generic) Every Other Day/15 Days Discharge Instructions: Apply over primary dressing as directed. Compression Wrap: Kerlix Roll 4.5x3.1 (in/yd) (Home Health) (Generic) Every Other Day/15 Days Discharge Instructions: Apply Kerlix and Coban compression as directed. Compression Wrap: Coban Self-Adherent Wrap 4x5 (in/yd) (Home Health) (Generic) Every Other Day/15 Days Discharge  Instructions: Apply over Kerlix as directed. Wound #34 - Foot Wound Laterality: Dorsal, Right, Medial Cleanser: Wound Cleanser (Home Health) (Generic) Every Other Day/15 Days Discharge Instructions: Cleanse the wound with wound cleanser prior to applying a clean dressing using gauze sponges, not tissue or cotton balls. Cleanser: Soap and Water [facility]) Every Other Day/15 Days Discharge Instructions: May shower and wash wound with dial antibacterial soap and water prior to dressing change. Peri-Wound Care: Triamcinolone 15 (g) (Home Health) Every Other Day/15 Days Discharge Instructions: Use triamcinolone 15 (g) as directed Peri-Wound Care: Sween Lotion (Moisturizing lotion) (Home Health) Every Other Day/15 Days Discharge Instructions: Apply moisturizing lotion as directed Prim Dressing: Hydrofera Blue Classic Foam, 4x4 in [facility]) Every Other Day/15 Days ary Discharge Instructions: Moisten with saline prior to applying to wound bed Secondary Dressing: Woven Gauze Sponge, Non-Sterile 4x4 in (Home Health) (Generic) Every Other Day/15 Days Discharge Instructions: Apply over primary dressing as directed.  [name] [name], [name] [name] ([id number]) Visit Report for 12/06/2020 Debridement Details Patient Name: Date of Service: [name] [name] [facility] F. 12/06/2020 9:15 A M Medical Record Number: [id number] Patient Account Number: [id number] Date of Birth/Sex: Treating RN: Jul 31, 1945 (75 y.o. [name] [name], [name] [name] Primary Care Provider: [name], [name] Other Clinician: Referring Provider: Treating Provider/Extender: [name] [name] [name] [name] Weeks in Treatment: 242 Debridement Performed for Assessment: Wound #34 Right,Medial,Dorsal Foot Performed By: Physician [name] [name]., MD Debridement Type: Debridement Severity of Tissue Pre Debridement: Fat layer exposed Level of Consciousness (Pre-procedure): Awake and Alert Pre-procedure Verification/Time Out Yes - 09:50 Taken: Start Time: 09:51 Pain Control: Lidocaine 4% T opical Solution T Area Debrided (L x W): otal 1.7 (cm) x 1.6 (cm) = 2.72 (cm) Tissue and other material debrided: Viable, Non-Viable, Slough, Subcutaneous, Tendon, Slough Level: Skin/Subcutaneous Tissue/Muscle Debridement Description: Excisional Instrument: Curette, Forceps, Scissors Bleeding: Minimum Hemostasis Achieved: Silver Nitrate End Time: 09:53 Procedural Pain: 0 Post Procedural Pain: 0 Response to Treatment: Procedure was tolerated well Level of Consciousness (Post- Awake and Alert procedure): Post Debridement Measurements of Total Wound Length: (cm) 1.7 Width: (cm) 1.6 Depth: (cm) 0.7 Volume: (cm) 1.495 Character of Wound/Ulcer Post Debridement: Improved Severity of Tissue Post Debridement: Fat layer exposed Post Procedure Diagnosis Same as Pre-procedure Electronic Signature(s) Signed: 12/06/2020 4:53:39 PM By: [name] Ham MD Signed: 12/06/2020 5:19:53 PM By: [name] [name] Entered By: [name] [name] on 12/06/2020 10:20:07 -------------------------------------------------------------------------------- HPI Details Patient Name: Date of Service: [name] [name]  F. 12/06/2020 9:15 A M Medical Record Number: [id number] Patient Account Number: [id number] Date of Birth/Sex: Treating RN: 1945-06-27 (75 y.o. [name] [name] Primary Care Provider: [name], [name] Other Clinician: Referring Provider: Treating Provider/Extender: [name] [name] [name] [name] Weeks in Treatment: 242 History of Present Illness Location: On the left and right lateral forefoot which has been there for about 6 months Quality: Patient reports No Pain. Severity: Patient states wound(s) are getting worse. Duration: Patient has had the wound for > 6 months prior to seeking treatment at the wound center Context: The wound would happen gradually Modifying Factors: Patient wound(s)/ulcer(s) are worsening due to :continual drainage from the wound ssociated Signs and Symptoms: Patient reports having increase discharge. A HPI Description: This patient returns after being seen here till the end of August and he was lost to follow-up. he has been quite debilitated laying in bed most of the time and his condition has deteriorated significantly. He has multiple ulcerations on the heel lateral forefoot and some of his toes. ======== Old notes: 75 year old male known to our practice when he was seen here in February and March and was lost to follow-up when he was admitted to hospital with various medical problems including coronary artery disease and a stroke. Now returns with the problem on the left forefoot where he has an ulceration and this has been there for about 6 months. most recently he was in hospital between July 6 and July 16, when he was admitted and treated for acute respiratory failure is secondary to aspiration pneumonia, large non-STEMI, ischemic cardiomyopathy with systolic and diastolic congestive heart failure with ejection fraction about 15-20%, ventricular tachycardia and has been treated with amiodarone, acute on careful up at the common new acute CVA, acute chronic kidney  disease stage III, anemia, uncontrolled diabetes mellitus with last hemoglobin A1c being 12%. He has had persistent hyperglycemia given recently. Patient has a past medical history of diabetes mellitus, hypertension, combined systolic and diastolic heart failure, peripheral neuropathy, gout, cardiomyopathy with ejection fraction of  on 12/06/2020 10:29:56 -------------------------------------------------------------------------------- Physician Orders Details Patient Name: Date of Service: [name] [name] [facility] F. 12/06/2020 9:15 A M Medical Record Number: [id number] Patient Account Number: [id number] Date of Birth/Sex: Treating RN: [birth year] (75 y.o. 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 - Dr. Dellia Nims Bathing/ Shower/ Hygiene May shower with protection but do not get wound dressing(s) wet. Edema Control - Lymphedema / SCD / Other Elevate legs to the  level of the heart or above for 30 minutes daily and/or when sitting, a frequency of: Avoid standing for long periods of time. Off-Loading Other: - multipodus boots to both feet Home Health New wound care orders this week; continue Home Health for wound care. May utilize formulary equivalent dressing for wound treatment orders unless otherwise specified. [name] [name] to change 1 time a week Wound Treatment Wound #25 - Lower Leg Wound Laterality: Right, Lateral Cleanser: Wound Cleanser (Home Health) (Generic) Every Other Day/15 Days Discharge Instructions: Cleanse the wound with wound cleanser prior to applying a clean dressing using gauze sponges, not tissue or cotton balls. Cleanser: Soap and Water [facility]) Every Other Day/15 Days Discharge Instructions: May shower and wash wound with dial antibacterial soap and water prior to dressing change. Peri-Wound Care: Triamcinolone 15 (g) (Home Health) Every Other Day/15 Days Discharge Instructions: Use triamcinolone 15 (g) as directed Peri-Wound Care: Sween Lotion (Moisturizing lotion) (Home Health) Every Other Day/15 Days Discharge Instructions: Apply moisturizing lotion as directed Prim Dressing: Hydrofera Blue Classic Foam, 4x4 in [facility]) Every Other Day/15 Days ary Discharge Instructions: Moisten with saline prior to applying to wound bed Secondary Dressing: Woven Gauze Sponge, Non-Sterile 4x4 in (Home Health) (Generic) Every Other Day/15 Days Discharge Instructions: Apply over primary dressing as directed. Secondary Dressing: ABD Pad, 5x9 (Home Health) (Generic) Every Other Day/15 Days Discharge Instructions: Apply over primary dressing as directed. Compression Wrap: Kerlix Roll 4.5x3.1 (in/yd) (Home Health) (Generic) Every Other Day/15 Days Discharge Instructions: Apply Kerlix and Coban compression as directed. Compression Wrap: Coban Self-Adherent Wrap 4x5 (in/yd) (Home Health) (Generic) Every Other Day/15 Days Discharge  Instructions: Apply over Kerlix as directed. Wound #34 - Foot Wound Laterality: Dorsal, Right, Medial Cleanser: Wound Cleanser (Home Health) (Generic) Every Other Day/15 Days Discharge Instructions: Cleanse the wound with wound cleanser prior to applying a clean dressing using gauze sponges, not tissue or cotton balls. Cleanser: Soap and Water [facility]) Every Other Day/15 Days Discharge Instructions: May shower and wash wound with dial antibacterial soap and water prior to dressing change. Peri-Wound Care: Triamcinolone 15 (g) (Home Health) Every Other Day/15 Days Discharge Instructions: Use triamcinolone 15 (g) as directed Peri-Wound Care: Sween Lotion (Moisturizing lotion) (Home Health) Every Other Day/15 Days Discharge Instructions: Apply moisturizing lotion as directed Prim Dressing: Hydrofera Blue Classic Foam, 4x4 in [facility]) Every Other Day/15 Days ary Discharge Instructions: Moisten with saline prior to applying to wound bed Secondary Dressing: Woven Gauze Sponge, Non-Sterile 4x4 in (Home Health) (Generic) Every Other Day/15 Days Discharge Instructions: Apply over primary dressing as directed.  eschared. He also has an area on the right posterior lateral calf which looks stable. 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 health from 09/23/2020 through 10/13/2020. He is done fairly well I think from that point of view. Infectious disease apparently recommended blood cultures because of the underlying osteomyelitis although I am really not sure what the exact clinical indication was other than the fact he has a pacemaker. He was also discovered to have a heart murmur. Finally he had listed a pressure injury on his medial sacrum during the hospitalization although his daughter assures me today that this is not open. The patient has a deep punched out area on the right dorsal foot. Fairly extensive area on the right lateral calf just above the ankle and he has an abrasion on his right dorsal knee 7/26; the patient has a deep punched out area on the right dorsal foot  proximally with exposed tendon. He also had an area on the right lateral leg. He comes in today with a new wound on the right lateral heel. We have been using silver alginate all changed to silver collagen today. I have a note from vascular 11/08/2020. He had a CT CO2 angiogram which I think was limited on the right leg. He is going to repeat this apparently for 4/17 I certainly agree with this 8/16; none of the patient's wounds looks as though it is making any progress. He has the deep punched out area on the right dorsal foot. Exposed tendon and necrotic material. I used scissors and pickups to remove superficial tendon and then a #5 curette to remove subcutaneous debris. I am still a tendon here. Nothing looks viable. NEW today he has abrasion injuries on his right patella which he says are from hitting it when he was getting in the car. I believe he has had areas here before. Area on the right lateral heel completely nonviable. Along the right lateral calf. This does not look too bad although not much different from last time. ANGIOGRAM booked for next week Electronic Signature(s) Signed: 12/06/2020 4:53:39 PM By: [name] Ham MD Entered By: [name] [name] on 12/06/2020 10:29:16 -------------------------------------------------------------------------------- Physical Exam Details Patient Name: Date of Service: [name] [name] F. 12/06/2020 9:15 A M Medical Record Number: [id number] Patient Account Number: [id number] Date of Birth/Sex: Treating RN: 07-Jan-1946 (75 y.o. [name] [name] Primary Care Provider: [name], [name] Other Clinician: Referring Provider: Treating Provider/Extender: Shantell Belongia [name] [name] Weeks in Treatment: 242 Constitutional Sitting or standing Blood Pressure is within target range for patient.. Pulse regular and within target range for patient.Marland Kitchen Respirations regular, non-labored and within target range.. Temperature is normal and within the target range for  the patient.Marland Kitchen Appears in no distress. Cardiovascular Both his popliteal pulses and his femoral pulses on the right are palpable and his posterior tibial pulses also palpable. Notes Wound exam; right dorsal foot. Completely necrotic wound. I removed tendon subcutaneous tissue. I am still not able to get to a viable surface there is some bleeding here. The area on the right lateral heel again nonviable right lateral calf looks about the same not much healing. She had new areas which are superficial abrasions on the right anterior patella which apparently were traumatic on the car door when transferring. These look superficial no evidence of infection Electronic Signature(s) Signed: 12/06/2020 4:53:39 PM By: [name] Ham MD Entered By: [name] [name]  [name] [name], [name] [name] ([id number]) Visit Report for 12/06/2020 Debridement Details Patient Name: Date of Service: [name] [name] [facility] F. 12/06/2020 9:15 A M Medical Record Number: [id number] Patient Account Number: [id number] Date of Birth/Sex: Treating RN: Jul 31, 1945 (75 y.o. [name] [name], [name] [name] Primary Care Provider: [name], [name] Other Clinician: Referring Provider: Treating Provider/Extender: [name] [name] [name] [name] Weeks in Treatment: 242 Debridement Performed for Assessment: Wound #34 Right,Medial,Dorsal Foot Performed By: Physician [name] [name]., MD Debridement Type: Debridement Severity of Tissue Pre Debridement: Fat layer exposed Level of Consciousness (Pre-procedure): Awake and Alert Pre-procedure Verification/Time Out Yes - 09:50 Taken: Start Time: 09:51 Pain Control: Lidocaine 4% T opical Solution T Area Debrided (L x W): otal 1.7 (cm) x 1.6 (cm) = 2.72 (cm) Tissue and other material debrided: Viable, Non-Viable, Slough, Subcutaneous, Tendon, Slough Level: Skin/Subcutaneous Tissue/Muscle Debridement Description: Excisional Instrument: Curette, Forceps, Scissors Bleeding: Minimum Hemostasis Achieved: Silver Nitrate End Time: 09:53 Procedural Pain: 0 Post Procedural Pain: 0 Response to Treatment: Procedure was tolerated well Level of Consciousness (Post- Awake and Alert procedure): Post Debridement Measurements of Total Wound Length: (cm) 1.7 Width: (cm) 1.6 Depth: (cm) 0.7 Volume: (cm) 1.495 Character of Wound/Ulcer Post Debridement: Improved Severity of Tissue Post Debridement: Fat layer exposed Post Procedure Diagnosis Same as Pre-procedure Electronic Signature(s) Signed: 12/06/2020 4:53:39 PM By: [name] Ham MD Signed: 12/06/2020 5:19:53 PM By: [name] [name] Entered By: [name] [name] on 12/06/2020 10:20:07 -------------------------------------------------------------------------------- HPI Details Patient Name: Date of Service: [name] [name]  F. 12/06/2020 9:15 A M Medical Record Number: [id number] Patient Account Number: [id number] Date of Birth/Sex: Treating RN: 1945-06-27 (75 y.o. [name] [name] Primary Care Provider: [name], [name] Other Clinician: Referring Provider: Treating Provider/Extender: [name] [name] [name] [name] Weeks in Treatment: 242 History of Present Illness Location: On the left and right lateral forefoot which has been there for about 6 months Quality: Patient reports No Pain. Severity: Patient states wound(s) are getting worse. Duration: Patient has had the wound for > 6 months prior to seeking treatment at the wound center Context: The wound would happen gradually Modifying Factors: Patient wound(s)/ulcer(s) are worsening due to :continual drainage from the wound ssociated Signs and Symptoms: Patient reports having increase discharge. A HPI Description: This patient returns after being seen here till the end of August and he was lost to follow-up. he has been quite debilitated laying in bed most of the time and his condition has deteriorated significantly. He has multiple ulcerations on the heel lateral forefoot and some of his toes. ======== Old notes: 75 year old male known to our practice when he was seen here in February and March and was lost to follow-up when he was admitted to hospital with various medical problems including coronary artery disease and a stroke. Now returns with the problem on the left forefoot where he has an ulceration and this has been there for about 6 months. most recently he was in hospital between July 6 and July 16, when he was admitted and treated for acute respiratory failure is secondary to aspiration pneumonia, large non-STEMI, ischemic cardiomyopathy with systolic and diastolic congestive heart failure with ejection fraction about 15-20%, ventricular tachycardia and has been treated with amiodarone, acute on careful up at the common new acute CVA, acute chronic kidney  disease stage III, anemia, uncontrolled diabetes mellitus with last hemoglobin A1c being 12%. He has had persistent hyperglycemia given recently. Patient has a past medical history of diabetes mellitus, hypertension, combined systolic and diastolic heart failure, peripheral neuropathy, gout, cardiomyopathy with ejection fraction of  eschared. He also has an area on the right posterior lateral calf which looks stable. 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 health from 09/23/2020 through 10/13/2020. He is done fairly well I think from that point of view. Infectious disease apparently recommended blood cultures because of the underlying osteomyelitis although I am really not sure what the exact clinical indication was other than the fact he has a pacemaker. He was also discovered to have a heart murmur. Finally he had listed a pressure injury on his medial sacrum during the hospitalization although his daughter assures me today that this is not open. The patient has a deep punched out area on the right dorsal foot. Fairly extensive area on the right lateral calf just above the ankle and he has an abrasion on his right dorsal knee 7/26; the patient has a deep punched out area on the right dorsal foot  proximally with exposed tendon. He also had an area on the right lateral leg. He comes in today with a new wound on the right lateral heel. We have been using silver alginate all changed to silver collagen today. I have a note from vascular 11/08/2020. He had a CT CO2 angiogram which I think was limited on the right leg. He is going to repeat this apparently for 4/17 I certainly agree with this 8/16; none of the patient's wounds looks as though it is making any progress. He has the deep punched out area on the right dorsal foot. Exposed tendon and necrotic material. I used scissors and pickups to remove superficial tendon and then a #5 curette to remove subcutaneous debris. I am still a tendon here. Nothing looks viable. NEW today he has abrasion injuries on his right patella which he says are from hitting it when he was getting in the car. I believe he has had areas here before. Area on the right lateral heel completely nonviable. Along the right lateral calf. This does not look too bad although not much different from last time. ANGIOGRAM booked for next week Electronic Signature(s) Signed: 12/06/2020 4:53:39 PM By: [name] Ham MD Entered By: [name] [name] on 12/06/2020 10:29:16 -------------------------------------------------------------------------------- Physical Exam Details Patient Name: Date of Service: [name] [name] F. 12/06/2020 9:15 A M Medical Record Number: [id number] Patient Account Number: [id number] Date of Birth/Sex: Treating RN: 07-Jan-1946 (75 y.o. [name] [name] Primary Care Provider: [name], [name] Other Clinician: Referring Provider: Treating Provider/Extender: Shantell Belongia [name] [name] Weeks in Treatment: 242 Constitutional Sitting or standing Blood Pressure is within target range for patient.. Pulse regular and within target range for patient.Marland Kitchen Respirations regular, non-labored and within target range.. Temperature is normal and within the target range for  the patient.Marland Kitchen Appears in no distress. Cardiovascular Both his popliteal pulses and his femoral pulses on the right are palpable and his posterior tibial pulses also palpable. Notes Wound exam; right dorsal foot. Completely necrotic wound. I removed tendon subcutaneous tissue. I am still not able to get to a viable surface there is some bleeding here. The area on the right lateral heel again nonviable right lateral calf looks about the same not much healing. She had new areas which are superficial abrasions on the right anterior patella which apparently were traumatic on the car door when transferring. These look superficial no evidence of infection Electronic Signature(s) Signed: 12/06/2020 4:53:39 PM By: [name] Ham MD Entered By: [name] [name]  of left ankle limited to breakdown of skin 07/01/2018 07/01/2018 L03.032 Cellulitis of left toe 01/06/2019 01/06/2019 B14.782 Pressure ulcer of left heel, stage 3 12/04/2016 12/04/2016 I25.119 Atherosclerotic heart disease of native coronary artery with unspecified angina 04/17/2016 04/17/2016 pectoris L97.821 Non-pressure  chronic ulcer of other part of left lower leg limited to breakdown of skin 01/06/2019 01/06/2019 N56.21 Acute diastolic (congestive) heart failure 04/17/2016 04/17/2016 L97.521 Non-pressure chronic ulcer of other part of left foot limited to breakdown of skin 02/17/2019 02/17/2019 S51.811D Laceration without foreign body of right forearm, subsequent encounter 10/22/2017 10/22/2017 L03.116 Cellulitis of left lower limb 12/24/2017 12/24/2017 L89.620 Pressure ulcer of left heel, unstageable 07/21/2019 07/21/2019 L97.524 Non-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 06/14/2020 06/14/2020 L97.211 Non-pressure chronic ulcer of right calf limited to breakdown of skin 07/21/2019 07/21/2019 M86.172 Other acute osteomyelitis, left ankle and foot 08/30/2020 08/30/2020 S80.211D Abrasion, right knee, subsequent encounter 08/18/2019 08/18/2019 Resolved Problems ICD-10 Code Description Active Date Resolved Date L89.512 Pressure ulcer of right ankle, stage 2 04/17/2016 04/17/2016 L89.522 Pressure ulcer of left ankle, stage 2 04/17/2016 04/17/2016 Electronic Signature(s) Signed: 12/06/2020 4:53:39 PM By: [name] Ham MD Entered By: [name] [name] on 12/06/2020 10:28:37 -------------------------------------------------------------------------------- Progress Note Details Patient Name: Date of Service: [name] [name] F. 12/06/2020 9:15 A M Medical Record Number: [id number] Patient Account Number: [id number] Date of Birth/Sex: Treating RN: [birth year] (75 y.o. [name] [name] Primary Care Provider: [name], [name] Other Clinician: Referring Provider: Treating Provider/Extender: Mishaal Lansdale [name] [name] Weeks in Treatment: 242 Subjective History of Present Illness (HPI) The following HPI elements were documented for the patient's wound: Location: On the left and right lateral forefoot which has been there for about 6 months Quality: Patient reports No Pain. Severity: Patient states  wound(s) are getting worse. Duration: Patient has had the wound for > 6 months prior to seeking treatment at the wound center Context: The wound would happen gradually Modifying Factors: Patient wound(s)/ulcer(s) are worsening due to :continual drainage from the wound Associated Signs and Symptoms: Patient reports having increase discharge. This patient returns after being seen here till the end of August and he was lost to follow-up. he has been quite debilitated laying in bed most of the time and his condition has deteriorated significantly. He has multiple ulcerations on the heel lateral forefoot and some of his toes. ======== Old notes: 75 year old male known to our practice when he was seen here in February and March and was lost to follow-up when he was admitted to hospital with various medical problems including coronary artery disease and a stroke. Now returns with the problem on the left forefoot where he has an ulceration and this has been there for about 6 months. 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 He has had persistent hyperglycemia given recently. Patient has a past medical history of diabetes mellitus, hypertension, combined systolic and diastolic heart failure, peripheral neuropathy, gout, cardiomyopathy with ejection fraction of about 10-15%, coronary artery disease, recent ventricular fibrillation, chronic kidney disease, implantable defibrillator, sleep apnea, status post laceration repair to the left arm  eschared. He also has an area on the right posterior lateral calf which looks stable. 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 health from 09/23/2020 through 10/13/2020. He is done fairly well I think from that point of view. Infectious disease apparently recommended blood cultures because of the underlying osteomyelitis although I am really not sure what the exact clinical indication was other than the fact he has a pacemaker. He was also discovered to have a heart murmur. Finally he had listed a pressure injury on his medial sacrum during the hospitalization although his daughter assures me today that this is not open. The patient has a deep punched out area on the right dorsal foot. Fairly extensive area on the right lateral calf just above the ankle and he has an abrasion on his right dorsal knee 7/26; the patient has a deep punched out area on the right dorsal foot  proximally with exposed tendon. He also had an area on the right lateral leg. He comes in today with a new wound on the right lateral heel. We have been using silver alginate all changed to silver collagen today. I have a note from vascular 11/08/2020. He had a CT CO2 angiogram which I think was limited on the right leg. He is going to repeat this apparently for 4/17 I certainly agree with this 8/16; none of the patient's wounds looks as though it is making any progress. He has the deep punched out area on the right dorsal foot. Exposed tendon and necrotic material. I used scissors and pickups to remove superficial tendon and then a #5 curette to remove subcutaneous debris. I am still a tendon here. Nothing looks viable. NEW today he has abrasion injuries on his right patella which he says are from hitting it when he was getting in the car. I believe he has had areas here before. Area on the right lateral heel completely nonviable. Along the right lateral calf. This does not look too bad although not much different from last time. ANGIOGRAM booked for next week Electronic Signature(s) Signed: 12/06/2020 4:53:39 PM By: [name] Ham MD Entered By: [name] [name] on 12/06/2020 10:29:16 -------------------------------------------------------------------------------- Physical Exam Details Patient Name: Date of Service: [name] [name] F. 12/06/2020 9:15 A M Medical Record Number: [id number] Patient Account Number: [id number] Date of Birth/Sex: Treating RN: 07-Jan-1946 (75 y.o. [name] [name] Primary Care Provider: [name], [name] Other Clinician: Referring Provider: Treating Provider/Extender: Shantell Belongia [name] [name] Weeks in Treatment: 242 Constitutional Sitting or standing Blood Pressure is within target range for patient.. Pulse regular and within target range for patient.Marland Kitchen Respirations regular, non-labored and within target range.. Temperature is normal and within the target range for  the patient.Marland Kitchen Appears in no distress. Cardiovascular Both his popliteal pulses and his femoral pulses on the right are palpable and his posterior tibial pulses also palpable. Notes Wound exam; right dorsal foot. Completely necrotic wound. I removed tendon subcutaneous tissue. I am still not able to get to a viable surface there is some bleeding here. The area on the right lateral heel again nonviable right lateral calf looks about the same not much healing. She had new areas which are superficial abrasions on the right anterior patella which apparently were traumatic on the car door when transferring. These look superficial no evidence of infection Electronic Signature(s) Signed: 12/06/2020 4:53:39 PM By: [name] Ham MD Entered By: [name] [name]  of left ankle limited to breakdown of skin 07/01/2018 07/01/2018 L03.032 Cellulitis of left toe 01/06/2019 01/06/2019 B14.782 Pressure ulcer of left heel, stage 3 12/04/2016 12/04/2016 I25.119 Atherosclerotic heart disease of native coronary artery with unspecified angina 04/17/2016 04/17/2016 pectoris L97.821 Non-pressure  chronic ulcer of other part of left lower leg limited to breakdown of skin 01/06/2019 01/06/2019 N56.21 Acute diastolic (congestive) heart failure 04/17/2016 04/17/2016 L97.521 Non-pressure chronic ulcer of other part of left foot limited to breakdown of skin 02/17/2019 02/17/2019 S51.811D Laceration without foreign body of right forearm, subsequent encounter 10/22/2017 10/22/2017 L03.116 Cellulitis of left lower limb 12/24/2017 12/24/2017 L89.620 Pressure ulcer of left heel, unstageable 07/21/2019 07/21/2019 L97.524 Non-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 06/14/2020 06/14/2020 L97.211 Non-pressure chronic ulcer of right calf limited to breakdown of skin 07/21/2019 07/21/2019 M86.172 Other acute osteomyelitis, left ankle and foot 08/30/2020 08/30/2020 S80.211D Abrasion, right knee, subsequent encounter 08/18/2019 08/18/2019 Resolved Problems ICD-10 Code Description Active Date Resolved Date L89.512 Pressure ulcer of right ankle, stage 2 04/17/2016 04/17/2016 L89.522 Pressure ulcer of left ankle, stage 2 04/17/2016 04/17/2016 Electronic Signature(s) Signed: 12/06/2020 4:53:39 PM By: [name] Ham MD Entered By: [name] [name] on 12/06/2020 10:28:37 -------------------------------------------------------------------------------- Progress Note Details Patient Name: Date of Service: [name] [name] F. 12/06/2020 9:15 A M Medical Record Number: [id number] Patient Account Number: [id number] Date of Birth/Sex: Treating RN: [birth year] (75 y.o. [name] [name] Primary Care Provider: [name], [name] Other Clinician: Referring Provider: Treating Provider/Extender: Mishaal Lansdale [name] [name] Weeks in Treatment: 242 Subjective History of Present Illness (HPI) The following HPI elements were documented for the patient's wound: Location: On the left and right lateral forefoot which has been there for about 6 months Quality: Patient reports No Pain. Severity: Patient states  wound(s) are getting worse. Duration: Patient has had the wound for > 6 months prior to seeking treatment at the wound center Context: The wound would happen gradually Modifying Factors: Patient wound(s)/ulcer(s) are worsening due to :continual drainage from the wound Associated Signs and Symptoms: Patient reports having increase discharge. This patient returns after being seen here till the end of August and he was lost to follow-up. he has been quite debilitated laying in bed most of the time and his condition has deteriorated significantly. He has multiple ulcerations on the heel lateral forefoot and some of his toes. ======== Old notes: 75 year old male known to our practice when he was seen here in February and March and was lost to follow-up when he was admitted to hospital with various medical problems including coronary artery disease and a stroke. Now returns with the problem on the left forefoot where he has an ulceration and this has been there for about 6 months. 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 He has had persistent hyperglycemia given recently. Patient has a past medical history of diabetes mellitus, hypertension, combined systolic and diastolic heart failure, peripheral neuropathy, gout, cardiomyopathy with ejection fraction of about 10-15%, coronary artery disease, recent ventricular fibrillation, chronic kidney disease, implantable defibrillator, sleep apnea, status post laceration repair to the left arm  [name] [name], [name] [name] ([id number]) Visit Report for 12/06/2020 Debridement Details Patient Name: Date of Service: [name] [name] [facility] F. 12/06/2020 9:15 A M Medical Record Number: [id number] Patient Account Number: [id number] Date of Birth/Sex: Treating RN: Jul 31, 1945 (75 y.o. [name] [name], [name] [name] Primary Care Provider: [name], [name] Other Clinician: Referring Provider: Treating Provider/Extender: [name] [name] [name] [name] Weeks in Treatment: 242 Debridement Performed for Assessment: Wound #34 Right,Medial,Dorsal Foot Performed By: Physician [name] [name]., MD Debridement Type: Debridement Severity of Tissue Pre Debridement: Fat layer exposed Level of Consciousness (Pre-procedure): Awake and Alert Pre-procedure Verification/Time Out Yes - 09:50 Taken: Start Time: 09:51 Pain Control: Lidocaine 4% T opical Solution T Area Debrided (L x W): otal 1.7 (cm) x 1.6 (cm) = 2.72 (cm) Tissue and other material debrided: Viable, Non-Viable, Slough, Subcutaneous, Tendon, Slough Level: Skin/Subcutaneous Tissue/Muscle Debridement Description: Excisional Instrument: Curette, Forceps, Scissors Bleeding: Minimum Hemostasis Achieved: Silver Nitrate End Time: 09:53 Procedural Pain: 0 Post Procedural Pain: 0 Response to Treatment: Procedure was tolerated well Level of Consciousness (Post- Awake and Alert procedure): Post Debridement Measurements of Total Wound Length: (cm) 1.7 Width: (cm) 1.6 Depth: (cm) 0.7 Volume: (cm) 1.495 Character of Wound/Ulcer Post Debridement: Improved Severity of Tissue Post Debridement: Fat layer exposed Post Procedure Diagnosis Same as Pre-procedure Electronic Signature(s) Signed: 12/06/2020 4:53:39 PM By: [name] Ham MD Signed: 12/06/2020 5:19:53 PM By: [name] [name] Entered By: [name] [name] on 12/06/2020 10:20:07 -------------------------------------------------------------------------------- HPI Details Patient Name: Date of Service: [name] [name]  F. 12/06/2020 9:15 A M Medical Record Number: [id number] Patient Account Number: [id number] Date of Birth/Sex: Treating RN: 1945-06-27 (75 y.o. [name] [name] Primary Care Provider: [name], [name] Other Clinician: Referring Provider: Treating Provider/Extender: [name] [name] [name] [name] Weeks in Treatment: 242 History of Present Illness Location: On the left and right lateral forefoot which has been there for about 6 months Quality: Patient reports No Pain. Severity: Patient states wound(s) are getting worse. Duration: Patient has had the wound for > 6 months prior to seeking treatment at the wound center Context: The wound would happen gradually Modifying Factors: Patient wound(s)/ulcer(s) are worsening due to :continual drainage from the wound ssociated Signs and Symptoms: Patient reports having increase discharge. A HPI Description: This patient returns after being seen here till the end of August and he was lost to follow-up. he has been quite debilitated laying in bed most of the time and his condition has deteriorated significantly. He has multiple ulcerations on the heel lateral forefoot and some of his toes. ======== Old notes: 75 year old male known to our practice when he was seen here in February and March and was lost to follow-up when he was admitted to hospital with various medical problems including coronary artery disease and a stroke. Now returns with the problem on the left forefoot where he has an ulceration and this has been there for about 6 months. most recently he was in hospital between July 6 and July 16, when he was admitted and treated for acute respiratory failure is secondary to aspiration pneumonia, large non-STEMI, ischemic cardiomyopathy with systolic and diastolic congestive heart failure with ejection fraction about 15-20%, ventricular tachycardia and has been treated with amiodarone, acute on careful up at the common new acute CVA, acute chronic kidney  disease stage III, anemia, uncontrolled diabetes mellitus with last hemoglobin A1c being 12%. He has had persistent hyperglycemia given recently. Patient has a past medical history of diabetes mellitus, hypertension, combined systolic and diastolic heart failure, peripheral neuropathy, gout, cardiomyopathy with ejection fraction of  eschared. He also has an area on the right posterior lateral calf which looks stable. 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 health from 09/23/2020 through 10/13/2020. He is done fairly well I think from that point of view. Infectious disease apparently recommended blood cultures because of the underlying osteomyelitis although I am really not sure what the exact clinical indication was other than the fact he has a pacemaker. He was also discovered to have a heart murmur. Finally he had listed a pressure injury on his medial sacrum during the hospitalization although his daughter assures me today that this is not open. The patient has a deep punched out area on the right dorsal foot. Fairly extensive area on the right lateral calf just above the ankle and he has an abrasion on his right dorsal knee 7/26; the patient has a deep punched out area on the right dorsal foot  proximally with exposed tendon. He also had an area on the right lateral leg. He comes in today with a new wound on the right lateral heel. We have been using silver alginate all changed to silver collagen today. I have a note from vascular 11/08/2020. He had a CT CO2 angiogram which I think was limited on the right leg. He is going to repeat this apparently for 4/17 I certainly agree with this 8/16; none of the patient's wounds looks as though it is making any progress. He has the deep punched out area on the right dorsal foot. Exposed tendon and necrotic material. I used scissors and pickups to remove superficial tendon and then a #5 curette to remove subcutaneous debris. I am still a tendon here. Nothing looks viable. NEW today he has abrasion injuries on his right patella which he says are from hitting it when he was getting in the car. I believe he has had areas here before. Area on the right lateral heel completely nonviable. Along the right lateral calf. This does not look too bad although not much different from last time. ANGIOGRAM booked for next week Electronic Signature(s) Signed: 12/06/2020 4:53:39 PM By: [name] Ham MD Entered By: [name] [name] on 12/06/2020 10:29:16 -------------------------------------------------------------------------------- Physical Exam Details Patient Name: Date of Service: [name] [name] F. 12/06/2020 9:15 A M Medical Record Number: [id number] Patient Account Number: [id number] Date of Birth/Sex: Treating RN: 07-Jan-1946 (75 y.o. [name] [name] Primary Care Provider: [name], [name] Other Clinician: Referring Provider: Treating Provider/Extender: Shantell Belongia [name] [name] Weeks in Treatment: 242 Constitutional Sitting or standing Blood Pressure is within target range for patient.. Pulse regular and within target range for patient.Marland Kitchen Respirations regular, non-labored and within target range.. Temperature is normal and within the target range for  the patient.Marland Kitchen Appears in no distress. Cardiovascular Both his popliteal pulses and his femoral pulses on the right are palpable and his posterior tibial pulses also palpable. Notes Wound exam; right dorsal foot. Completely necrotic wound. I removed tendon subcutaneous tissue. I am still not able to get to a viable surface there is some bleeding here. The area on the right lateral heel again nonviable right lateral calf looks about the same not much healing. She had new areas which are superficial abrasions on the right anterior patella which apparently were traumatic on the car door when transferring. These look superficial no evidence of infection Electronic Signature(s) Signed: 12/06/2020 4:53:39 PM By: [name] Ham MD Entered By: [name] [name]  eschared. He also has an area on the right posterior lateral calf which looks stable. 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 health from 09/23/2020 through 10/13/2020. He is done fairly well I think from that point of view. Infectious disease apparently recommended blood cultures because of the underlying osteomyelitis although I am really not sure what the exact clinical indication was other than the fact he has a pacemaker. He was also discovered to have a heart murmur. Finally he had listed a pressure injury on his medial sacrum during the hospitalization although his daughter assures me today that this is not open. The patient has a deep punched out area on the right dorsal foot. Fairly extensive area on the right lateral calf just above the ankle and he has an abrasion on his right dorsal knee 7/26; the patient has a deep punched out area on the right dorsal foot  proximally with exposed tendon. He also had an area on the right lateral leg. He comes in today with a new wound on the right lateral heel. We have been using silver alginate all changed to silver collagen today. I have a note from vascular 11/08/2020. He had a CT CO2 angiogram which I think was limited on the right leg. He is going to repeat this apparently for 4/17 I certainly agree with this 8/16; none of the patient's wounds looks as though it is making any progress. He has the deep punched out area on the right dorsal foot. Exposed tendon and necrotic material. I used scissors and pickups to remove superficial tendon and then a #5 curette to remove subcutaneous debris. I am still a tendon here. Nothing looks viable. NEW today he has abrasion injuries on his right patella which he says are from hitting it when he was getting in the car. I believe he has had areas here before. Area on the right lateral heel completely nonviable. Along the right lateral calf. This does not look too bad although not much different from last time. ANGIOGRAM booked for next week Electronic Signature(s) Signed: 12/06/2020 4:53:39 PM By: [name] Ham MD Entered By: [name] [name] on 12/06/2020 10:29:16 -------------------------------------------------------------------------------- Physical Exam Details Patient Name: Date of Service: [name] [name] F. 12/06/2020 9:15 A M Medical Record Number: [id number] Patient Account Number: [id number] Date of Birth/Sex: Treating RN: 07-Jan-1946 (75 y.o. [name] [name] Primary Care Provider: [name], [name] Other Clinician: Referring Provider: Treating Provider/Extender: Shantell Belongia [name] [name] Weeks in Treatment: 242 Constitutional Sitting or standing Blood Pressure is within target range for patient.. Pulse regular and within target range for patient.Marland Kitchen Respirations regular, non-labored and within target range.. Temperature is normal and within the target range for  the patient.Marland Kitchen Appears in no distress. Cardiovascular Both his popliteal pulses and his femoral pulses on the right are palpable and his posterior tibial pulses also palpable. Notes Wound exam; right dorsal foot. Completely necrotic wound. I removed tendon subcutaneous tissue. I am still not able to get to a viable surface there is some bleeding here. The area on the right lateral heel again nonviable right lateral calf looks about the same not much healing. She had new areas which are superficial abrasions on the right anterior patella which apparently were traumatic on the car door when transferring. These look superficial no evidence of infection Electronic Signature(s) Signed: 12/06/2020 4:53:39 PM By: [name] Ham MD Entered By: [name] [name]  on 12/06/2020 10:29:56 -------------------------------------------------------------------------------- Physician Orders Details Patient Name: Date of Service: [name] [name] [facility] F. 12/06/2020 9:15 A M Medical Record Number: [id number] Patient Account Number: [id number] Date of Birth/Sex: Treating RN: [birth year] (75 y.o. 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 - Dr. Dellia Nims Bathing/ Shower/ Hygiene May shower with protection but do not get wound dressing(s) wet. Edema Control - Lymphedema / SCD / Other Elevate legs to the  level of the heart or above for 30 minutes daily and/or when sitting, a frequency of: Avoid standing for long periods of time. Off-Loading Other: - multipodus boots to both feet Home Health New wound care orders this week; continue Home Health for wound care. May utilize formulary equivalent dressing for wound treatment orders unless otherwise specified. [name] [name] to change 1 time a week Wound Treatment Wound #25 - Lower Leg Wound Laterality: Right, Lateral Cleanser: Wound Cleanser (Home Health) (Generic) Every Other Day/15 Days Discharge Instructions: Cleanse the wound with wound cleanser prior to applying a clean dressing using gauze sponges, not tissue or cotton balls. Cleanser: Soap and Water [facility]) Every Other Day/15 Days Discharge Instructions: May shower and wash wound with dial antibacterial soap and water prior to dressing change. Peri-Wound Care: Triamcinolone 15 (g) (Home Health) Every Other Day/15 Days Discharge Instructions: Use triamcinolone 15 (g) as directed Peri-Wound Care: Sween Lotion (Moisturizing lotion) (Home Health) Every Other Day/15 Days Discharge Instructions: Apply moisturizing lotion as directed Prim Dressing: Hydrofera Blue Classic Foam, 4x4 in [facility]) Every Other Day/15 Days ary Discharge Instructions: Moisten with saline prior to applying to wound bed Secondary Dressing: Woven Gauze Sponge, Non-Sterile 4x4 in (Home Health) (Generic) Every Other Day/15 Days Discharge Instructions: Apply over primary dressing as directed. Secondary Dressing: ABD Pad, 5x9 (Home Health) (Generic) Every Other Day/15 Days Discharge Instructions: Apply over primary dressing as directed. Compression Wrap: Kerlix Roll 4.5x3.1 (in/yd) (Home Health) (Generic) Every Other Day/15 Days Discharge Instructions: Apply Kerlix and Coban compression as directed. Compression Wrap: Coban Self-Adherent Wrap 4x5 (in/yd) (Home Health) (Generic) Every Other Day/15 Days Discharge  Instructions: Apply over Kerlix as directed. Wound #34 - Foot Wound Laterality: Dorsal, Right, Medial Cleanser: Wound Cleanser (Home Health) (Generic) Every Other Day/15 Days Discharge Instructions: Cleanse the wound with wound cleanser prior to applying a clean dressing using gauze sponges, not tissue or cotton balls. Cleanser: Soap and Water [facility]) Every Other Day/15 Days Discharge Instructions: May shower and wash wound with dial antibacterial soap and water prior to dressing change. Peri-Wound Care: Triamcinolone 15 (g) (Home Health) Every Other Day/15 Days Discharge Instructions: Use triamcinolone 15 (g) as directed Peri-Wound Care: Sween Lotion (Moisturizing lotion) (Home Health) Every Other Day/15 Days Discharge Instructions: Apply moisturizing lotion as directed Prim Dressing: Hydrofera Blue Classic Foam, 4x4 in [facility]) Every Other Day/15 Days ary Discharge Instructions: Moisten with saline prior to applying to wound bed Secondary Dressing: Woven Gauze Sponge, Non-Sterile 4x4 in (Home Health) (Generic) Every Other Day/15 Days Discharge Instructions: Apply over primary dressing as directed.  on 12/06/2020 10:29:56 -------------------------------------------------------------------------------- Physician Orders Details Patient Name: Date of Service: [name] [name] [facility] F. 12/06/2020 9:15 A M Medical Record Number: [id number] Patient Account Number: [id number] Date of Birth/Sex: Treating RN: [birth year] (75 y.o. 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 - Dr. Dellia Nims Bathing/ Shower/ Hygiene May shower with protection but do not get wound dressing(s) wet. Edema Control - Lymphedema / SCD / Other Elevate legs to the  level of the heart or above for 30 minutes daily and/or when sitting, a frequency of: Avoid standing for long periods of time. Off-Loading Other: - multipodus boots to both feet Home Health New wound care orders this week; continue Home Health for wound care. May utilize formulary equivalent dressing for wound treatment orders unless otherwise specified. [name] [name] to change 1 time a week Wound Treatment Wound #25 - Lower Leg Wound Laterality: Right, Lateral Cleanser: Wound Cleanser (Home Health) (Generic) Every Other Day/15 Days Discharge Instructions: Cleanse the wound with wound cleanser prior to applying a clean dressing using gauze sponges, not tissue or cotton balls. Cleanser: Soap and Water [facility]) Every Other Day/15 Days Discharge Instructions: May shower and wash wound with dial antibacterial soap and water prior to dressing change. Peri-Wound Care: Triamcinolone 15 (g) (Home Health) Every Other Day/15 Days Discharge Instructions: Use triamcinolone 15 (g) as directed Peri-Wound Care: Sween Lotion (Moisturizing lotion) (Home Health) Every Other Day/15 Days Discharge Instructions: Apply moisturizing lotion as directed Prim Dressing: Hydrofera Blue Classic Foam, 4x4 in [facility]) Every Other Day/15 Days ary Discharge Instructions: Moisten with saline prior to applying to wound bed Secondary Dressing: Woven Gauze Sponge, Non-Sterile 4x4 in (Home Health) (Generic) Every Other Day/15 Days Discharge Instructions: Apply over primary dressing as directed. Secondary Dressing: ABD Pad, 5x9 (Home Health) (Generic) Every Other Day/15 Days Discharge Instructions: Apply over primary dressing as directed. Compression Wrap: Kerlix Roll 4.5x3.1 (in/yd) (Home Health) (Generic) Every Other Day/15 Days Discharge Instructions: Apply Kerlix and Coban compression as directed. Compression Wrap: Coban Self-Adherent Wrap 4x5 (in/yd) (Home Health) (Generic) Every Other Day/15 Days Discharge  Instructions: Apply over Kerlix as directed. Wound #34 - Foot Wound Laterality: Dorsal, Right, Medial Cleanser: Wound Cleanser (Home Health) (Generic) Every Other Day/15 Days Discharge Instructions: Cleanse the wound with wound cleanser prior to applying a clean dressing using gauze sponges, not tissue or cotton balls. Cleanser: Soap and Water [facility]) Every Other Day/15 Days Discharge Instructions: May shower and wash wound with dial antibacterial soap and water prior to dressing change. Peri-Wound Care: Triamcinolone 15 (g) (Home Health) Every Other Day/15 Days Discharge Instructions: Use triamcinolone 15 (g) as directed Peri-Wound Care: Sween Lotion (Moisturizing lotion) (Home Health) Every Other Day/15 Days Discharge Instructions: Apply moisturizing lotion as directed Prim Dressing: Hydrofera Blue Classic Foam, 4x4 in [facility]) Every Other Day/15 Days ary Discharge Instructions: Moisten with saline prior to applying to wound bed Secondary Dressing: Woven Gauze Sponge, Non-Sterile 4x4 in (Home Health) (Generic) Every Other Day/15 Days Discharge Instructions: Apply over primary dressing as directed.  [name] [name], [name] [name] ([id number]) Visit Report for 12/06/2020 Debridement Details Patient Name: Date of Service: [name] [name] [facility] F. 12/06/2020 9:15 A M Medical Record Number: [id number] Patient Account Number: [id number] Date of Birth/Sex: Treating RN: Jul 31, 1945 (75 y.o. [name] [name], [name] [name] Primary Care Provider: [name], [name] Other Clinician: Referring Provider: Treating Provider/Extender: [name] [name] [name] [name] Weeks in Treatment: 242 Debridement Performed for Assessment: Wound #34 Right,Medial,Dorsal Foot Performed By: Physician [name] [name]., MD Debridement Type: Debridement Severity of Tissue Pre Debridement: Fat layer exposed Level of Consciousness (Pre-procedure): Awake and Alert Pre-procedure Verification/Time Out Yes - 09:50 Taken: Start Time: 09:51 Pain Control: Lidocaine 4% T opical Solution T Area Debrided (L x W): otal 1.7 (cm) x 1.6 (cm) = 2.72 (cm) Tissue and other material debrided: Viable, Non-Viable, Slough, Subcutaneous, Tendon, Slough Level: Skin/Subcutaneous Tissue/Muscle Debridement Description: Excisional Instrument: Curette, Forceps, Scissors Bleeding: Minimum Hemostasis Achieved: Silver Nitrate End Time: 09:53 Procedural Pain: 0 Post Procedural Pain: 0 Response to Treatment: Procedure was tolerated well Level of Consciousness (Post- Awake and Alert procedure): Post Debridement Measurements of Total Wound Length: (cm) 1.7 Width: (cm) 1.6 Depth: (cm) 0.7 Volume: (cm) 1.495 Character of Wound/Ulcer Post Debridement: Improved Severity of Tissue Post Debridement: Fat layer exposed Post Procedure Diagnosis Same as Pre-procedure Electronic Signature(s) Signed: 12/06/2020 4:53:39 PM By: [name] Ham MD Signed: 12/06/2020 5:19:53 PM By: [name] [name] Entered By: [name] [name] on 12/06/2020 10:20:07 -------------------------------------------------------------------------------- HPI Details Patient Name: Date of Service: [name] [name]  F. 12/06/2020 9:15 A M Medical Record Number: [id number] Patient Account Number: [id number] Date of Birth/Sex: Treating RN: 1945-06-27 (75 y.o. [name] [name] Primary Care Provider: [name], [name] Other Clinician: Referring Provider: Treating Provider/Extender: [name] [name] [name] [name] Weeks in Treatment: 242 History of Present Illness Location: On the left and right lateral forefoot which has been there for about 6 months Quality: Patient reports No Pain. Severity: Patient states wound(s) are getting worse. Duration: Patient has had the wound for > 6 months prior to seeking treatment at the wound center Context: The wound would happen gradually Modifying Factors: Patient wound(s)/ulcer(s) are worsening due to :continual drainage from the wound ssociated Signs and Symptoms: Patient reports having increase discharge. A HPI Description: This patient returns after being seen here till the end of August and he was lost to follow-up. he has been quite debilitated laying in bed most of the time and his condition has deteriorated significantly. He has multiple ulcerations on the heel lateral forefoot and some of his toes. ======== Old notes: 75 year old male known to our practice when he was seen here in February and March and was lost to follow-up when he was admitted to hospital with various medical problems including coronary artery disease and a stroke. Now returns with the problem on the left forefoot where he has an ulceration and this has been there for about 6 months. most recently he was in hospital between July 6 and July 16, when he was admitted and treated for acute respiratory failure is secondary to aspiration pneumonia, large non-STEMI, ischemic cardiomyopathy with systolic and diastolic congestive heart failure with ejection fraction about 15-20%, ventricular tachycardia and has been treated with amiodarone, acute on careful up at the common new acute CVA, acute chronic kidney  disease stage III, anemia, uncontrolled diabetes mellitus with last hemoglobin A1c being 12%. He has had persistent hyperglycemia given recently. Patient has a past medical history of diabetes mellitus, hypertension, combined systolic and diastolic heart failure, peripheral neuropathy, gout, cardiomyopathy with ejection fraction of  of left ankle limited to breakdown of skin 07/01/2018 07/01/2018 L03.032 Cellulitis of left toe 01/06/2019 01/06/2019 B14.782 Pressure ulcer of left heel, stage 3 12/04/2016 12/04/2016 I25.119 Atherosclerotic heart disease of native coronary artery with unspecified angina 04/17/2016 04/17/2016 pectoris L97.821 Non-pressure  chronic ulcer of other part of left lower leg limited to breakdown of skin 01/06/2019 01/06/2019 N56.21 Acute diastolic (congestive) heart failure 04/17/2016 04/17/2016 L97.521 Non-pressure chronic ulcer of other part of left foot limited to breakdown of skin 02/17/2019 02/17/2019 S51.811D Laceration without foreign body of right forearm, subsequent encounter 10/22/2017 10/22/2017 L03.116 Cellulitis of left lower limb 12/24/2017 12/24/2017 L89.620 Pressure ulcer of left heel, unstageable 07/21/2019 07/21/2019 L97.524 Non-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 06/14/2020 06/14/2020 L97.211 Non-pressure chronic ulcer of right calf limited to breakdown of skin 07/21/2019 07/21/2019 M86.172 Other acute osteomyelitis, left ankle and foot 08/30/2020 08/30/2020 S80.211D Abrasion, right knee, subsequent encounter 08/18/2019 08/18/2019 Resolved Problems ICD-10 Code Description Active Date Resolved Date L89.512 Pressure ulcer of right ankle, stage 2 04/17/2016 04/17/2016 L89.522 Pressure ulcer of left ankle, stage 2 04/17/2016 04/17/2016 Electronic Signature(s) Signed: 12/06/2020 4:53:39 PM By: [name] Ham MD Entered By: [name] [name] on 12/06/2020 10:28:37 -------------------------------------------------------------------------------- Progress Note Details Patient Name: Date of Service: [name] [name] F. 12/06/2020 9:15 A M Medical Record Number: [id number] Patient Account Number: [id number] Date of Birth/Sex: Treating RN: [birth year] (75 y.o. [name] [name] Primary Care Provider: [name], [name] Other Clinician: Referring Provider: Treating Provider/Extender: Mishaal Lansdale [name] [name] Weeks in Treatment: 242 Subjective History of Present Illness (HPI) The following HPI elements were documented for the patient's wound: Location: On the left and right lateral forefoot which has been there for about 6 months Quality: Patient reports No Pain. Severity: Patient states  wound(s) are getting worse. Duration: Patient has had the wound for > 6 months prior to seeking treatment at the wound center Context: The wound would happen gradually Modifying Factors: Patient wound(s)/ulcer(s) are worsening due to :continual drainage from the wound Associated Signs and Symptoms: Patient reports having increase discharge. This patient returns after being seen here till the end of August and he was lost to follow-up. he has been quite debilitated laying in bed most of the time and his condition has deteriorated significantly. He has multiple ulcerations on the heel lateral forefoot and some of his toes. ======== Old notes: 75 year old male known to our practice when he was seen here in February and March and was lost to follow-up when he was admitted to hospital with various medical problems including coronary artery disease and a stroke. Now returns with the problem on the left forefoot where he has an ulceration and this has been there for about 6 months. 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 He has had persistent hyperglycemia given recently. Patient has a past medical history of diabetes mellitus, hypertension, combined systolic and diastolic heart failure, peripheral neuropathy, gout, cardiomyopathy with ejection fraction of about 10-15%, coronary artery disease, recent ventricular fibrillation, chronic kidney disease, implantable defibrillator, sleep apnea, status post laceration repair to the left arm  on 12/06/2020 10:29:56 -------------------------------------------------------------------------------- Physician Orders Details Patient Name: Date of Service: [name] [name] [facility] F. 12/06/2020 9:15 A M Medical Record Number: [id number] Patient Account Number: [id number] Date of Birth/Sex: Treating RN: [birth year] (75 y.o. 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 - Dr. Dellia Nims Bathing/ Shower/ Hygiene May shower with protection but do not get wound dressing(s) wet. Edema Control - Lymphedema / SCD / Other Elevate legs to the  level of the heart or above for 30 minutes daily and/or when sitting, a frequency of: Avoid standing for long periods of time. Off-Loading Other: - multipodus boots to both feet Home Health New wound care orders this week; continue Home Health for wound care. May utilize formulary equivalent dressing for wound treatment orders unless otherwise specified. [name] [name] to change 1 time a week Wound Treatment Wound #25 - Lower Leg Wound Laterality: Right, Lateral Cleanser: Wound Cleanser (Home Health) (Generic) Every Other Day/15 Days Discharge Instructions: Cleanse the wound with wound cleanser prior to applying a clean dressing using gauze sponges, not tissue or cotton balls. Cleanser: Soap and Water [facility]) Every Other Day/15 Days Discharge Instructions: May shower and wash wound with dial antibacterial soap and water prior to dressing change. Peri-Wound Care: Triamcinolone 15 (g) (Home Health) Every Other Day/15 Days Discharge Instructions: Use triamcinolone 15 (g) as directed Peri-Wound Care: Sween Lotion (Moisturizing lotion) (Home Health) Every Other Day/15 Days Discharge Instructions: Apply moisturizing lotion as directed Prim Dressing: Hydrofera Blue Classic Foam, 4x4 in [facility]) Every Other Day/15 Days ary Discharge Instructions: Moisten with saline prior to applying to wound bed Secondary Dressing: Woven Gauze Sponge, Non-Sterile 4x4 in (Home Health) (Generic) Every Other Day/15 Days Discharge Instructions: Apply over primary dressing as directed. Secondary Dressing: ABD Pad, 5x9 (Home Health) (Generic) Every Other Day/15 Days Discharge Instructions: Apply over primary dressing as directed. Compression Wrap: Kerlix Roll 4.5x3.1 (in/yd) (Home Health) (Generic) Every Other Day/15 Days Discharge Instructions: Apply Kerlix and Coban compression as directed. Compression Wrap: Coban Self-Adherent Wrap 4x5 (in/yd) (Home Health) (Generic) Every Other Day/15 Days Discharge  Instructions: Apply over Kerlix as directed. Wound #34 - Foot Wound Laterality: Dorsal, Right, Medial Cleanser: Wound Cleanser (Home Health) (Generic) Every Other Day/15 Days Discharge Instructions: Cleanse the wound with wound cleanser prior to applying a clean dressing using gauze sponges, not tissue or cotton balls. Cleanser: Soap and Water [facility]) Every Other Day/15 Days Discharge Instructions: May shower and wash wound with dial antibacterial soap and water prior to dressing change. Peri-Wound Care: Triamcinolone 15 (g) (Home Health) Every Other Day/15 Days Discharge Instructions: Use triamcinolone 15 (g) as directed Peri-Wound Care: Sween Lotion (Moisturizing lotion) (Home Health) Every Other Day/15 Days Discharge Instructions: Apply moisturizing lotion as directed Prim Dressing: Hydrofera Blue Classic Foam, 4x4 in [facility]) Every Other Day/15 Days ary Discharge Instructions: Moisten with saline prior to applying to wound bed Secondary Dressing: Woven Gauze Sponge, Non-Sterile 4x4 in (Home Health) (Generic) Every Other Day/15 Days Discharge Instructions: Apply over primary dressing as directed.  on 12/06/2020 10:29:56 -------------------------------------------------------------------------------- Physician Orders Details Patient Name: Date of Service: [name] [name] [facility] F. 12/06/2020 9:15 A M Medical Record Number: [id number] Patient Account Number: [id number] Date of Birth/Sex: Treating RN: [birth year] (75 y.o. 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 - Dr. Dellia Nims Bathing/ Shower/ Hygiene May shower with protection but do not get wound dressing(s) wet. Edema Control - Lymphedema / SCD / Other Elevate legs to the  level of the heart or above for 30 minutes daily and/or when sitting, a frequency of: Avoid standing for long periods of time. Off-Loading Other: - multipodus boots to both feet Home Health New wound care orders this week; continue Home Health for wound care. May utilize formulary equivalent dressing for wound treatment orders unless otherwise specified. [name] [name] to change 1 time a week Wound Treatment Wound #25 - Lower Leg Wound Laterality: Right, Lateral Cleanser: Wound Cleanser (Home Health) (Generic) Every Other Day/15 Days Discharge Instructions: Cleanse the wound with wound cleanser prior to applying a clean dressing using gauze sponges, not tissue or cotton balls. Cleanser: Soap and Water [facility]) Every Other Day/15 Days Discharge Instructions: May shower and wash wound with dial antibacterial soap and water prior to dressing change. Peri-Wound Care: Triamcinolone 15 (g) (Home Health) Every Other Day/15 Days Discharge Instructions: Use triamcinolone 15 (g) as directed Peri-Wound Care: Sween Lotion (Moisturizing lotion) (Home Health) Every Other Day/15 Days Discharge Instructions: Apply moisturizing lotion as directed Prim Dressing: Hydrofera Blue Classic Foam, 4x4 in [facility]) Every Other Day/15 Days ary Discharge Instructions: Moisten with saline prior to applying to wound bed Secondary Dressing: Woven Gauze Sponge, Non-Sterile 4x4 in (Home Health) (Generic) Every Other Day/15 Days Discharge Instructions: Apply over primary dressing as directed. Secondary Dressing: ABD Pad, 5x9 (Home Health) (Generic) Every Other Day/15 Days Discharge Instructions: Apply over primary dressing as directed. Compression Wrap: Kerlix Roll 4.5x3.1 (in/yd) (Home Health) (Generic) Every Other Day/15 Days Discharge Instructions: Apply Kerlix and Coban compression as directed. Compression Wrap: Coban Self-Adherent Wrap 4x5 (in/yd) (Home Health) (Generic) Every Other Day/15 Days Discharge  Instructions: Apply over Kerlix as directed. Wound #34 - Foot Wound Laterality: Dorsal, Right, Medial Cleanser: Wound Cleanser (Home Health) (Generic) Every Other Day/15 Days Discharge Instructions: Cleanse the wound with wound cleanser prior to applying a clean dressing using gauze sponges, not tissue or cotton balls. Cleanser: Soap and Water [facility]) Every Other Day/15 Days Discharge Instructions: May shower and wash wound with dial antibacterial soap and water prior to dressing change. Peri-Wound Care: Triamcinolone 15 (g) (Home Health) Every Other Day/15 Days Discharge Instructions: Use triamcinolone 15 (g) as directed Peri-Wound Care: Sween Lotion (Moisturizing lotion) (Home Health) Every Other Day/15 Days Discharge Instructions: Apply moisturizing lotion as directed Prim Dressing: Hydrofera Blue Classic Foam, 4x4 in [facility]) Every Other Day/15 Days ary Discharge Instructions: Moisten with saline prior to applying to wound bed Secondary Dressing: Woven Gauze Sponge, Non-Sterile 4x4 in (Home Health) (Generic) Every Other Day/15 Days Discharge Instructions: Apply over primary dressing as directed.  of left ankle limited to breakdown of skin 07/01/2018 07/01/2018 L03.032 Cellulitis of left toe 01/06/2019 01/06/2019 B14.782 Pressure ulcer of left heel, stage 3 12/04/2016 12/04/2016 I25.119 Atherosclerotic heart disease of native coronary artery with unspecified angina 04/17/2016 04/17/2016 pectoris L97.821 Non-pressure  chronic ulcer of other part of left lower leg limited to breakdown of skin 01/06/2019 01/06/2019 N56.21 Acute diastolic (congestive) heart failure 04/17/2016 04/17/2016 L97.521 Non-pressure chronic ulcer of other part of left foot limited to breakdown of skin 02/17/2019 02/17/2019 S51.811D Laceration without foreign body of right forearm, subsequent encounter 10/22/2017 10/22/2017 L03.116 Cellulitis of left lower limb 12/24/2017 12/24/2017 L89.620 Pressure ulcer of left heel, unstageable 07/21/2019 07/21/2019 L97.524 Non-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 06/14/2020 06/14/2020 L97.211 Non-pressure chronic ulcer of right calf limited to breakdown of skin 07/21/2019 07/21/2019 M86.172 Other acute osteomyelitis, left ankle and foot 08/30/2020 08/30/2020 S80.211D Abrasion, right knee, subsequent encounter 08/18/2019 08/18/2019 Resolved Problems ICD-10 Code Description Active Date Resolved Date L89.512 Pressure ulcer of right ankle, stage 2 04/17/2016 04/17/2016 L89.522 Pressure ulcer of left ankle, stage 2 04/17/2016 04/17/2016 Electronic Signature(s) Signed: 12/06/2020 4:53:39 PM By: [name] Ham MD Entered By: [name] [name] on 12/06/2020 10:28:37 -------------------------------------------------------------------------------- Progress Note Details Patient Name: Date of Service: [name] [name] F. 12/06/2020 9:15 A M Medical Record Number: [id number] Patient Account Number: [id number] Date of Birth/Sex: Treating RN: [birth year] (75 y.o. [name] [name] Primary Care Provider: [name], [name] Other Clinician: Referring Provider: Treating Provider/Extender: Mishaal Lansdale [name] [name] Weeks in Treatment: 242 Subjective History of Present Illness (HPI) The following HPI elements were documented for the patient's wound: Location: On the left and right lateral forefoot which has been there for about 6 months Quality: Patient reports No Pain. Severity: Patient states  wound(s) are getting worse. Duration: Patient has had the wound for > 6 months prior to seeking treatment at the wound center Context: The wound would happen gradually Modifying Factors: Patient wound(s)/ulcer(s) are worsening due to :continual drainage from the wound Associated Signs and Symptoms: Patient reports having increase discharge. This patient returns after being seen here till the end of August and he was lost to follow-up. he has been quite debilitated laying in bed most of the time and his condition has deteriorated significantly. He has multiple ulcerations on the heel lateral forefoot and some of his toes. ======== Old notes: 75 year old male known to our practice when he was seen here in February and March and was lost to follow-up when he was admitted to hospital with various medical problems including coronary artery disease and a stroke. Now returns with the problem on the left forefoot where he has an ulceration and this has been there for about 6 months. 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 He has had persistent hyperglycemia given recently. Patient has a past medical history of diabetes mellitus, hypertension, combined systolic and diastolic heart failure, peripheral neuropathy, gout, cardiomyopathy with ejection fraction of about 10-15%, coronary artery disease, recent ventricular fibrillation, chronic kidney disease, implantable defibrillator, sleep apnea, status post laceration repair to the left arm  of left ankle limited to breakdown of skin 07/01/2018 07/01/2018 L03.032 Cellulitis of left toe 01/06/2019 01/06/2019 B14.782 Pressure ulcer of left heel, stage 3 12/04/2016 12/04/2016 I25.119 Atherosclerotic heart disease of native coronary artery with unspecified angina 04/17/2016 04/17/2016 pectoris L97.821 Non-pressure  chronic ulcer of other part of left lower leg limited to breakdown of skin 01/06/2019 01/06/2019 N56.21 Acute diastolic (congestive) heart failure 04/17/2016 04/17/2016 L97.521 Non-pressure chronic ulcer of other part of left foot limited to breakdown of skin 02/17/2019 02/17/2019 S51.811D Laceration without foreign body of right forearm, subsequent encounter 10/22/2017 10/22/2017 L03.116 Cellulitis of left lower limb 12/24/2017 12/24/2017 L89.620 Pressure ulcer of left heel, unstageable 07/21/2019 07/21/2019 L97.524 Non-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 06/14/2020 06/14/2020 L97.211 Non-pressure chronic ulcer of right calf limited to breakdown of skin 07/21/2019 07/21/2019 M86.172 Other acute osteomyelitis, left ankle and foot 08/30/2020 08/30/2020 S80.211D Abrasion, right knee, subsequent encounter 08/18/2019 08/18/2019 Resolved Problems ICD-10 Code Description Active Date Resolved Date L89.512 Pressure ulcer of right ankle, stage 2 04/17/2016 04/17/2016 L89.522 Pressure ulcer of left ankle, stage 2 04/17/2016 04/17/2016 Electronic Signature(s) Signed: 12/06/2020 4:53:39 PM By: [name] Ham MD Entered By: [name] [name] on 12/06/2020 10:28:37 -------------------------------------------------------------------------------- Progress Note Details Patient Name: Date of Service: [name] [name] F. 12/06/2020 9:15 A M Medical Record Number: [id number] Patient Account Number: [id number] Date of Birth/Sex: Treating RN: [birth year] (75 y.o. [name] [name] Primary Care Provider: [name], [name] Other Clinician: Referring Provider: Treating Provider/Extender: Mishaal Lansdale [name] [name] Weeks in Treatment: 242 Subjective History of Present Illness (HPI) The following HPI elements were documented for the patient's wound: Location: On the left and right lateral forefoot which has been there for about 6 months Quality: Patient reports No Pain. Severity: Patient states  wound(s) are getting worse. Duration: Patient has had the wound for > 6 months prior to seeking treatment at the wound center Context: The wound would happen gradually Modifying Factors: Patient wound(s)/ulcer(s) are worsening due to :continual drainage from the wound Associated Signs and Symptoms: Patient reports having increase discharge. This patient returns after being seen here till the end of August and he was lost to follow-up. he has been quite debilitated laying in bed most of the time and his condition has deteriorated significantly. He has multiple ulcerations on the heel lateral forefoot and some of his toes. ======== Old notes: 75 year old male known to our practice when he was seen here in February and March and was lost to follow-up when he was admitted to hospital with various medical problems including coronary artery disease and a stroke. Now returns with the problem on the left forefoot where he has an ulceration and this has been there for about 6 months. 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 He has had persistent hyperglycemia given recently. Patient has a past medical history of diabetes mellitus, hypertension, combined systolic and diastolic heart failure, peripheral neuropathy, gout, cardiomyopathy with ejection fraction of about 10-15%, coronary artery disease, recent ventricular fibrillation, chronic kidney disease, implantable defibrillator, sleep apnea, status post laceration repair to the left arm  on 12/06/2020 10:29:56 -------------------------------------------------------------------------------- Physician Orders Details Patient Name: Date of Service: [name] [name] [facility] F. 12/06/2020 9:15 A M Medical Record Number: [id number] Patient Account Number: [id number] Date of Birth/Sex: Treating RN: [birth year] (75 y.o. 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 - Dr. Dellia Nims Bathing/ Shower/ Hygiene May shower with protection but do not get wound dressing(s) wet. Edema Control - Lymphedema / SCD / Other Elevate legs to the  level of the heart or above for 30 minutes daily and/or when sitting, a frequency of: Avoid standing for long periods of time. Off-Loading Other: - multipodus boots to both feet Home Health New wound care orders this week; continue Home Health for wound care. May utilize formulary equivalent dressing for wound treatment orders unless otherwise specified. [name] [name] to change 1 time a week Wound Treatment Wound #25 - Lower Leg Wound Laterality: Right, Lateral Cleanser: Wound Cleanser (Home Health) (Generic) Every Other Day/15 Days Discharge Instructions: Cleanse the wound with wound cleanser prior to applying a clean dressing using gauze sponges, not tissue or cotton balls. Cleanser: Soap and Water [facility]) Every Other Day/15 Days Discharge Instructions: May shower and wash wound with dial antibacterial soap and water prior to dressing change. Peri-Wound Care: Triamcinolone 15 (g) (Home Health) Every Other Day/15 Days Discharge Instructions: Use triamcinolone 15 (g) as directed Peri-Wound Care: Sween Lotion (Moisturizing lotion) (Home Health) Every Other Day/15 Days Discharge Instructions: Apply moisturizing lotion as directed Prim Dressing: Hydrofera Blue Classic Foam, 4x4 in [facility]) Every Other Day/15 Days ary Discharge Instructions: Moisten with saline prior to applying to wound bed Secondary Dressing: Woven Gauze Sponge, Non-Sterile 4x4 in (Home Health) (Generic) Every Other Day/15 Days Discharge Instructions: Apply over primary dressing as directed. Secondary Dressing: ABD Pad, 5x9 (Home Health) (Generic) Every Other Day/15 Days Discharge Instructions: Apply over primary dressing as directed. Compression Wrap: Kerlix Roll 4.5x3.1 (in/yd) (Home Health) (Generic) Every Other Day/15 Days Discharge Instructions: Apply Kerlix and Coban compression as directed. Compression Wrap: Coban Self-Adherent Wrap 4x5 (in/yd) (Home Health) (Generic) Every Other Day/15 Days Discharge  Instructions: Apply over Kerlix as directed. Wound #34 - Foot Wound Laterality: Dorsal, Right, Medial Cleanser: Wound Cleanser (Home Health) (Generic) Every Other Day/15 Days Discharge Instructions: Cleanse the wound with wound cleanser prior to applying a clean dressing using gauze sponges, not tissue or cotton balls. Cleanser: Soap and Water [facility]) Every Other Day/15 Days Discharge Instructions: May shower and wash wound with dial antibacterial soap and water prior to dressing change. Peri-Wound Care: Triamcinolone 15 (g) (Home Health) Every Other Day/15 Days Discharge Instructions: Use triamcinolone 15 (g) as directed Peri-Wound Care: Sween Lotion (Moisturizing lotion) (Home Health) Every Other Day/15 Days Discharge Instructions: Apply moisturizing lotion as directed Prim Dressing: Hydrofera Blue Classic Foam, 4x4 in [facility]) Every Other Day/15 Days ary Discharge Instructions: Moisten with saline prior to applying to wound bed Secondary Dressing: Woven Gauze Sponge, Non-Sterile 4x4 in (Home Health) (Generic) Every Other Day/15 Days Discharge Instructions: Apply over primary dressing as directed.  on 12/06/2020 10:29:56 -------------------------------------------------------------------------------- Physician Orders Details Patient Name: Date of Service: [name] [name] [facility] F. 12/06/2020 9:15 A M Medical Record Number: [id number] Patient Account Number: [id number] Date of Birth/Sex: Treating RN: [birth year] (75 y.o. 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 - Dr. Dellia Nims Bathing/ Shower/ Hygiene May shower with protection but do not get wound dressing(s) wet. Edema Control - Lymphedema / SCD / Other Elevate legs to the  level of the heart or above for 30 minutes daily and/or when sitting, a frequency of: Avoid standing for long periods of time. Off-Loading Other: - multipodus boots to both feet Home Health New wound care orders this week; continue Home Health for wound care. May utilize formulary equivalent dressing for wound treatment orders unless otherwise specified. [name] [name] to change 1 time a week Wound Treatment Wound #25 - Lower Leg Wound Laterality: Right, Lateral Cleanser: Wound Cleanser (Home Health) (Generic) Every Other Day/15 Days Discharge Instructions: Cleanse the wound with wound cleanser prior to applying a clean dressing using gauze sponges, not tissue or cotton balls. Cleanser: Soap and Water [facility]) Every Other Day/15 Days Discharge Instructions: May shower and wash wound with dial antibacterial soap and water prior to dressing change. Peri-Wound Care: Triamcinolone 15 (g) (Home Health) Every Other Day/15 Days Discharge Instructions: Use triamcinolone 15 (g) as directed Peri-Wound Care: Sween Lotion (Moisturizing lotion) (Home Health) Every Other Day/15 Days Discharge Instructions: Apply moisturizing lotion as directed Prim Dressing: Hydrofera Blue Classic Foam, 4x4 in [facility]) Every Other Day/15 Days ary Discharge Instructions: Moisten with saline prior to applying to wound bed Secondary Dressing: Woven Gauze Sponge, Non-Sterile 4x4 in (Home Health) (Generic) Every Other Day/15 Days Discharge Instructions: Apply over primary dressing as directed. Secondary Dressing: ABD Pad, 5x9 (Home Health) (Generic) Every Other Day/15 Days Discharge Instructions: Apply over primary dressing as directed. Compression Wrap: Kerlix Roll 4.5x3.1 (in/yd) (Home Health) (Generic) Every Other Day/15 Days Discharge Instructions: Apply Kerlix and Coban compression as directed. Compression Wrap: Coban Self-Adherent Wrap 4x5 (in/yd) (Home Health) (Generic) Every Other Day/15 Days Discharge  Instructions: Apply over Kerlix as directed. Wound #34 - Foot Wound Laterality: Dorsal, Right, Medial Cleanser: Wound Cleanser (Home Health) (Generic) Every Other Day/15 Days Discharge Instructions: Cleanse the wound with wound cleanser prior to applying a clean dressing using gauze sponges, not tissue or cotton balls. Cleanser: Soap and Water [facility]) Every Other Day/15 Days Discharge Instructions: May shower and wash wound with dial antibacterial soap and water prior to dressing change. Peri-Wound Care: Triamcinolone 15 (g) (Home Health) Every Other Day/15 Days Discharge Instructions: Use triamcinolone 15 (g) as directed Peri-Wound Care: Sween Lotion (Moisturizing lotion) (Home Health) Every Other Day/15 Days Discharge Instructions: Apply moisturizing lotion as directed Prim Dressing: Hydrofera Blue Classic Foam, 4x4 in [facility]) Every Other Day/15 Days ary Discharge Instructions: Moisten with saline prior to applying to wound bed Secondary Dressing: Woven Gauze Sponge, Non-Sterile 4x4 in (Home Health) (Generic) Every Other Day/15 Days Discharge Instructions: Apply over primary dressing as directed.

## 2020-12-08 DIAGNOSIS — E1122 Type 2 diabetes mellitus with diabetic chronic kidney disease: Secondary | ICD-10-CM | POA: Diagnosis not present

## 2020-12-08 DIAGNOSIS — I5042 Chronic combined systolic (congestive) and diastolic (congestive) heart failure: Secondary | ICD-10-CM | POA: Diagnosis not present

## 2020-12-08 DIAGNOSIS — N184 Chronic kidney disease, stage 4 (severe): Secondary | ICD-10-CM | POA: Diagnosis not present

## 2020-12-08 DIAGNOSIS — I69351 Hemiplegia and hemiparesis following cerebral infarction affecting right dominant side: Secondary | ICD-10-CM | POA: Diagnosis not present

## 2020-12-08 DIAGNOSIS — E1142 Type 2 diabetes mellitus with diabetic polyneuropathy: Secondary | ICD-10-CM | POA: Diagnosis not present

## 2020-12-08 DIAGNOSIS — I2510 Atherosclerotic heart disease of native coronary artery without angina pectoris: Secondary | ICD-10-CM | POA: Diagnosis not present

## 2020-12-08 DIAGNOSIS — Z4781 Encounter for orthopedic aftercare following surgical amputation: Secondary | ICD-10-CM | POA: Diagnosis not present

## 2020-12-08 DIAGNOSIS — I130 Hypertensive heart and chronic kidney disease with heart failure and stage 1 through stage 4 chronic kidney disease, or unspecified chronic kidney disease: Secondary | ICD-10-CM | POA: Diagnosis not present

## 2020-12-08 DIAGNOSIS — Z89612 Acquired absence of left leg above knee: Secondary | ICD-10-CM | POA: Diagnosis not present

## 2020-12-15 ENCOUNTER — Encounter (HOSPITAL_COMMUNITY): Payer: Self-pay | Admitting: Vascular Surgery

## 2020-12-15 ENCOUNTER — Encounter (HOSPITAL_COMMUNITY)
Admission: RE | Disposition: A | Discharge status: Home / Self Care | Payer: Self-pay | Source: Home / Self Care | Attending: Vascular Surgery

## 2020-12-15 ENCOUNTER — Ambulatory Visit (HOSPITAL_COMMUNITY)
Admission: RE | Admit: 2020-12-15 | Discharge: 2020-12-15 | Disposition: A | Discharge status: Home / Self Care | Payer: Medicare HMO | Attending: Vascular Surgery | Admitting: Vascular Surgery

## 2020-12-15 ENCOUNTER — Other Ambulatory Visit: Payer: Self-pay

## 2020-12-15 DIAGNOSIS — Z794 Long term (current) use of insulin: Secondary | ICD-10-CM | POA: Insufficient documentation

## 2020-12-15 DIAGNOSIS — Z91048 Other nonmedicinal substance allergy status: Secondary | ICD-10-CM | POA: Diagnosis not present

## 2020-12-15 DIAGNOSIS — Z79899 Other long term (current) drug therapy: Secondary | ICD-10-CM | POA: Diagnosis not present

## 2020-12-15 DIAGNOSIS — Z89612 Acquired absence of left leg above knee: Secondary | ICD-10-CM | POA: Diagnosis not present

## 2020-12-15 DIAGNOSIS — Z7982 Long term (current) use of aspirin: Secondary | ICD-10-CM | POA: Insufficient documentation

## 2020-12-15 DIAGNOSIS — Z7902 Long term (current) use of antithrombotics/antiplatelets: Secondary | ICD-10-CM | POA: Diagnosis not present

## 2020-12-15 DIAGNOSIS — L97819 Non-pressure chronic ulcer of other part of right lower leg with unspecified severity: Secondary | ICD-10-CM | POA: Diagnosis not present

## 2020-12-15 DIAGNOSIS — I70238 Atherosclerosis of native arteries of right leg with ulceration of other part of lower right leg: Secondary | ICD-10-CM | POA: Insufficient documentation

## 2020-12-15 DIAGNOSIS — Z882 Allergy status to sulfonamides status: Secondary | ICD-10-CM | POA: Diagnosis not present

## 2020-12-15 DIAGNOSIS — I70221 Atherosclerosis of native arteries of extremities with rest pain, right leg: Secondary | ICD-10-CM | POA: Diagnosis not present

## 2020-12-15 HISTORY — PX: ABDOMINAL AORTOGRAM W/LOWER EXTREMITY: CATH118223

## 2020-12-15 HISTORY — PX: PERIPHERAL VASCULAR BALLOON ANGIOPLASTY: CATH118281

## 2020-12-15 LAB — POCT I-STAT, CHEM 8
BUN: 34 mg/dL — ABNORMAL HIGH (ref 8–23)
BUN: 24 mg/dL — ABNORMAL HIGH (ref 8–23)
Calcium, Ion: 1.02 mmol/L — ABNORMAL LOW (ref 1.15–1.40)
Calcium, Ion: 1.2 mmol/L (ref 1.15–1.40)
Chloride: 114 mmol/L — ABNORMAL HIGH (ref 98–111)
Chloride: 111 mmol/L (ref 98–111)
Creatinine, Ser: 1.7 mg/dL — ABNORMAL HIGH (ref 0.61–1.24)
Creatinine, Ser: 1.7 mg/dL — ABNORMAL HIGH (ref 0.61–1.24)
Glucose, Bld: 118 mg/dL — ABNORMAL HIGH (ref 70–99)
Glucose, Bld: 113 mg/dL — ABNORMAL HIGH (ref 70–99)
HCT: 32 % — ABNORMAL LOW (ref 39.0–52.0)
HCT: 33 % — ABNORMAL LOW (ref 39.0–52.0)
Hemoglobin: 10.9 g/dL — ABNORMAL LOW (ref 13.0–17.0)
Hemoglobin: 11.2 g/dL — ABNORMAL LOW (ref 13.0–17.0)
Potassium: 5.9 mmol/L — ABNORMAL HIGH (ref 3.5–5.1)
Potassium: 3.7 mmol/L (ref 3.5–5.1)
Sodium: 147 mmol/L — ABNORMAL HIGH (ref 135–145)
Sodium: 150 mmol/L — ABNORMAL HIGH (ref 135–145)
TCO2: 26 mmol/L (ref 22–32)
TCO2: 25 mmol/L (ref 22–32)

## 2020-12-15 LAB — GLUCOSE, CAPILLARY: Glucose-Capillary: 116 mg/dL — ABNORMAL HIGH (ref 70–99)

## 2020-12-15 SURGERY — ABDOMINAL AORTOGRAM W/LOWER EXTREMITY
Anesthesia: LOCAL | Laterality: Right

## 2020-12-15 MED ORDER — LABETALOL HCL 5 MG/ML IV SOLN: 10.0000 mg | INTRAVENOUS | Status: DC | PRN

## 2020-12-15 MED ORDER — SODIUM CHLORIDE 0.9 % IV SOLN: INTRAVENOUS | Status: DC

## 2020-12-15 MED ORDER — ACETAMINOPHEN 325 MG PO TABS: 650.0000 mg | ORAL_TABLET | ORAL | Status: DC | PRN

## 2020-12-15 MED ORDER — HEPARIN SODIUM (PORCINE) 1000 UNIT/ML IJ SOLN
INTRAMUSCULAR | Status: AC
  Filled 2020-12-15: qty 1

## 2020-12-15 MED ORDER — LIDOCAINE HCL (PF) 1 % IJ SOLN
INTRAMUSCULAR | Status: AC
  Filled 2020-12-15: qty 30

## 2020-12-15 MED ORDER — ONDANSETRON HCL 4 MG/2ML IJ SOLN: 4.0000 mg | Freq: Four times a day (QID) | INTRAMUSCULAR | Status: DC | PRN

## 2020-12-15 MED ORDER — HEPARIN (PORCINE) IN NACL 1000-0.9 UT/500ML-% IV SOLN
INTRAVENOUS | Status: DC | PRN
  Administered 2020-12-15 (×2): 500 mL

## 2020-12-15 MED ORDER — SODIUM CHLORIDE 0.9% FLUSH: 3.0000 mL | Freq: Two times a day (BID) | INTRAVENOUS | Status: DC

## 2020-12-15 MED ORDER — SODIUM CHLORIDE 0.9% FLUSH: 3.0000 mL | INTRAVENOUS | Status: DC | PRN

## 2020-12-15 MED ORDER — HEPARIN SODIUM (PORCINE) 1000 UNIT/ML IJ SOLN
INTRAMUSCULAR | Status: DC | PRN
  Administered 2020-12-15: 9000 [IU] via INTRAVENOUS

## 2020-12-15 MED ORDER — IODIXANOL 320 MG/ML IV SOLN
INTRAVENOUS | Status: DC | PRN
  Administered 2020-12-15: 55 mL via INTRA_ARTERIAL

## 2020-12-15 MED ORDER — LIDOCAINE HCL (PF) 1 % IJ SOLN
INTRAMUSCULAR | Status: DC | PRN
  Administered 2020-12-15: 15 mL via INTRADERMAL

## 2020-12-15 MED ORDER — HEPARIN (PORCINE) IN NACL 1000-0.9 UT/500ML-% IV SOLN
INTRAVENOUS | Status: AC
  Filled 2020-12-15: qty 1000

## 2020-12-15 MED ORDER — HYDRALAZINE HCL 20 MG/ML IJ SOLN
5.0000 mg | INTRAMUSCULAR | Status: DC | PRN
Start: 2020-12-15 — End: 2020-12-15

## 2020-12-15 MED ORDER — SODIUM CHLORIDE 0.9 % IV SOLN: 250.0000 mL | INTRAVENOUS | Status: DC | PRN

## 2020-12-15 SURGICAL SUPPLY — 19 items
BALLN STERLING OTW 3X220X150 (BALLOONS) ×3
BALLOON STERLING OTW 3X220X150 (BALLOONS) IMPLANT
CATH OMNI FLUSH 5F 65CM (CATHETERS) ×1 IMPLANT
CATH TEMPO AQUA 5F 100CM (CATHETERS) ×1 IMPLANT
DCB RANGER 4.0X40 135 (BALLOONS) IMPLANT
DEVICE CLOSURE MYNXGRIP 5F (Vascular Products) ×1 IMPLANT
DRAPE C-ARM 35X43 STRL (DRAPES) ×1 IMPLANT
KIT ENCORE 26 ADVANTAGE (KITS) ×2 IMPLANT
KIT MICROPUNCTURE NIT STIFF (SHEATH) ×2 IMPLANT
KIT PV (KITS) ×3 IMPLANT
RANGER DCB 4.0X40 135 (BALLOONS) ×6
SHEATH FLEX ANSEL ANG 5F 45CM (SHEATH) ×1 IMPLANT
SHEATH PINNACLE 5F 10CM (SHEATH) ×1 IMPLANT
SYR MEDRAD MARK V 150ML (SYRINGE) ×1 IMPLANT
TRANSDUCER W/STOPCOCK (MISCELLANEOUS) ×3 IMPLANT
TRAY PV CATH (CUSTOM PROCEDURE TRAY) ×3 IMPLANT
WIRE BENTSON .035X145CM (WIRE) ×1 IMPLANT
WIRE G V18X300CM (WIRE) ×1 IMPLANT
WIRE ROSEN-J .035X260CM (WIRE) ×1 IMPLANT

## 2020-12-15 NOTE — Progress Notes (Signed)
Dr Carlis Abbott informed that pt has breakfast this am cup of OJ and cereal. Dr states that he can do procedure without sedation or reschedule. pt informed and states yes proceed without sedation.

## 2020-12-15 NOTE — Op Note (Signed)
Patient name: [name] [name] MRN: [id number] DOB: Aug 29, 1945 Sex: male  12/15/2020 Pre-operative Diagnosis: Critical limb ischemia of the right lower extremity with tissue loss Post-operative diagnosis:  Same Surgeon:  [name], MD Procedure Performed: 1.  Ultrasound-guided access left common femoral artery 2.  CO2 aortogram with catheter selection of aorta 3.  Right lower extremity arteriogram with selection of third order branches 4.  Right below-knee popliteal artery angioplasty (4 mm x 40 mm drug-coated Ranger x2) 5.  Right posterior tibial artery angioplasty (3 mm x 220 mm Sterling) 6.  Mynx closure of the left common femoral artery  Indications: Patient is a 75 year old male well-known to vascular surgery who previously underwent a left above-knee amputation.  He presents today for evaluation of right lower extremity blood flow given suboptimal toe pressures with wounds.  Risks and benefits discussed.  Findings:    Aortogram was done with CO2 and showed no evidence of aortoiliac stenosis or flow limitation.  Right lower extremity arteriogram was initially done with CO2 and showed widely patent common femoral, profunda, SFA.  His above-knee popliteal artery is widely patent and the below-knee popliteal artery had a focal greater than 70% stenosis.  Two-vessel runoff in the peroneal and posterior tibial.  The posterior tibial was diffusely diseased from the ostium to the mid vessel with multiple high-grade stenosis greater than 80%.  The peroneal had no flow limiting lesions and did fill the anterior tibial above the ankle with a collateral.  The right below-knee popliteal artery was treated with two 4 mm x 40 mm drug-coated Ranger's for 3-minute inflation times.  The right posterior tibial artery was angioplastied from the ostium all the way down to the distal calf with a 3 mm x 220 mm Sterling for 2 minutes.  Excellent results with no residual stenosis.  Two-vessel  preserved runoff in the peroneal and posterior tibial at completion.     Procedure:  The patient was identified in the holding area and taken to room 8.  The patient was then placed supine on the table and prepped and draped in the usual sterile fashion.  A time out was called.  Ultrasound was used to evaluate the left common femoral artery.  It was patent .  A digital ultrasound image was acquired.  A micropuncture needle was used to access the left common femoral artery under ultrasound guidance.  An 018 wire was advanced without resistance and a micropuncture sheath was placed.  The 018 wire was removed and a benson wire was placed.  The micropuncture sheath was exchanged for a 5 french sheath.  An omniflush catheter was advanced over the wire to the level of L-1.  An abdominal angiogram was obtained.  Next, using the omniflush catheter and a benson wire, the aortic bifurcation was crossed and the catheter was placed into the right right external iliac artery and right leg runoff was obtained.  Ultimately we could only get images down to about the popliteal with CO2.  I then used a J-wire to cannulate the right SFA we got 100 cm straight flush catheter to get dedicated images of the tibial vessels with limited contrast.  We elected to intervene on the right below-knee popliteal artery and tibial vessels.  I placed a long 5 [place] Ansell sheath in the left groin over the aortic bifurcation into the right SFA.  Patient was given 100 units/kg IV heparin.  We then placed a V 18 wire down the right lower extremity and  into the PT into the ankle.  The entire posterior tibial was then angioplasty with a 3 mm x 220 mm Sterling to nominal pressure for 2 minutes.  I then angioplastied the below-knee popliteal artery focal stenosis with a 4 mm x 40 mm Ranger to nominal pressure for 3 minutes.  Initial imaging showed residual stenosis at the ostium of the PT so I went back down with my 3 mm Sterling for a second inflation  to treat this lesion.  It also appeared that we needed to extend our drug-coated balloon in the below-knee popliteal artery so we treated a bit higher with another 4 mm x 40 mm Ranger.  Final hand-injection showed no evidence of residual stenosis with two-vessel preserved runoff in the PT and peroneal.  Wires and catheters were removed.  I put a short 5 French sheath in the left groin.  An access shot was obtained.  Mynx closure device was deployed in the left common femoral artery   Hand: Patient will continue aspirin Plavix.  We will arrange follow-up in 1 month with non-invasive imaging.  Optimized for wound healing from my standpoint.  Will continue going to wound clinic.  [name], MD [facility] [place] Office: [phone]

## 2020-12-15 NOTE — H&P (Signed)
HPI:  This is a 75 y.o. male who is s/p left AKA on 10/10/2020 by Dr. Carlis Abbott.     Ultimately he had abnormal ABIs.  He underwent arteriogram on 06/23/20 in which he had inline flow down the left lower extremity through peroneal artery (with underlying tibial disease).    He is followed by Dr. Dellia Nims at the wound clinic.  Recent CT of the foot does show underlying osteomyelitis.  He has been seen by Dr. [name] with ID who feels patient needs amputation.  Apparently these wounds have been under wound care for almost 300 weeks according to the daughter.  Dr. Haroldine Laws also has concerns about the risk of his foot wounds seeding his defibrillator and pacemaker.  He is non-ambulatory and transfer with assistance from daughter.   On aortogram, the RLE was limited but did appear to have a flush SFA occlusion.   Pt returns today for follow up with his daughter.  Pt states he does not really have any pain and his daughter states he did not take any pain medication.  She is concerned about the right leg as it has wounds present that is being followed by the wound center and he has Linneus changing dressing eery other day.  His daughter is concerned about the wounds on the right leg.         Allergies  Allergen Reactions   Tape Other (See Comments)      SKIN IS VERY FRAGILE- WILL TEAR AND BRUISE EASILY!!   Sulfa Antibiotics Rash            Current Outpatient Medications  Medication Sig Dispense Refill   Alcohol Swabs (B-D SINGLE USE SWABS REGULAR) PADS USE TO TEST BLOOD SUGAR DAILY 100 each 3   amiodarone (PACERONE) 200 MG tablet TAKE 1 TABLET EVERY DAY 90 tablet 3   aspirin EC 81 MG tablet Take 81 mg by mouth daily.       Blood Glucose Calibration (TRUE METRIX LEVEL 1) Low SOLN USE TO TEST BLOOD SUGAR DAILY 1 each 3   Blood Glucose Monitoring Suppl (TRUE METRIX AIR GLUCOSE METER) w/Device KIT 1 Device by Does not apply route daily. 1 kit 1   Blood Pressure Monitoring (BLOOD PRESSURE MONITOR AUTOMAT) DEVI To  check BP daily 1 Device 0   carvedilol (COREG) 3.125 MG tablet TAKE 1 TABLET (3.125 MG TOTAL) BY MOUTH 2 (TWO) TIMES DAILY WITH A MEAL. MUST KEEP PENDING APPT FOR FURTHER REFILLS 180 tablet 3   clopidogrel (PLAVIX) 75 MG tablet TAKE 1 TABLET EVERY DAY 90 tablet 3   clotrimazole-betamethasone (LOTRISONE) cream Apply 1 application topically daily as needed. mall amount. 30 g 1   finasteride (PROSCAR) 5 MG tablet TAKE 1 TABLET EVERY DAY 90 tablet 1   HYDROcodone-acetaminophen (NORCO/VICODIN) 5-325 MG tablet Take 1 tablet by mouth every 4 (four) hours as needed for moderate pain.       insulin aspart (NOVOLOG) 100 UNIT/ML injection Inject 3 Units into the skin 3 (three) times daily with meals.       insulin glargine (LANTUS SOLOSTAR) 100 UNIT/ML Solostar Pen Inject 10 Units into the skin at bedtime.       losartan (COZAAR) 25 MG tablet Take 0.5 tablets (12.5 mg total) by mouth daily.       Magnesium 500 MG TABS Take 500 mg by mouth every other day. In the morning       mirtazapine (REMERON) 15 MG tablet TAKE 1 TABLET (15 MG TOTAL) BY  MOUTH AT BEDTIME. 90 tablet 2   Multiple Vitamin (MULTIVITAMIN WITH MINERALS) TABS tablet Take 1 tablet by mouth daily. One-A-Day       nystatin (MYCOSTATIN/NYSTOP) powder Apply topically 3 (three) times daily. (Patient taking differently: Apply 1 application topically 3 (three) times daily as needed (irritation).) 60 g 2   rosuvastatin (CRESTOR) 40 MG tablet TAKE 1 TABLET EVERY DAY 90 tablet 3   SANTYL ointment Apply 1 application topically as needed (for wound care).       SILVER NITRATE EX Apply 1 application topically daily as needed (wound care).       tamsulosin (FLOMAX) 0.4 MG CAPS capsule TAKE 1 CAPSULE EVERY DAY 90 capsule 3   torsemide (DEMADEX) 20 MG tablet TAKE 1 TABLET EVERY OTHER DAY 45 tablet 2   TRUE METRIX BLOOD GLUCOSE TEST test strip USE  AS  INSTRUCTED 100 strip 3   TRUEplus Lancets 30G MISC USE  TO  TEST BLOOD SUGAR EVERY DAY (Patient taking  differently: USE  TO  TEST BLOOD SUGAR EVERY DAY) 100 each 5   Vitamin A 3 MG (10000 UT) TABS Take 10,000 mcg by mouth daily.       vitamin C (ASCORBIC ACID) 500 MG tablet Take 500 mg by mouth daily.       zinc gluconate 50 MG tablet Take 50 mg by mouth daily.        No current facility-administered medications for this visit.       ROS:  See HPI   Physical Exam:       Today's Vitals    11/08/20 0926 11/08/20 0930  BP: (!) 88/56    Pulse: 68    Resp: 14    Temp: (!) 97 F (36.1 C)    TempSrc: Temporal    SpO2: 98%    Height: 6' 1"  (1.854 m)    PainSc: 0-No pain 0-No pain    Body mass index is 25.89 kg/m.     Incision:  left AKA incision has healed nicely.   Extremities:  right leg bandaged.        Assessment/Plan:  This is a 75 y.o. male who is s/p: Left AKA 10/10/2020 by Dr. Carlis Abbott   Plan right leg arteriogram and possible intervention today.  Right leg tissue loss.    [name], MD [facility] [place] Office: [place]

## 2020-12-27 ENCOUNTER — Encounter (HOSPITAL_BASED_OUTPATIENT_CLINIC_OR_DEPARTMENT_OTHER): Payer: Medicare HMO | Attending: Internal Medicine | Admitting: Internal Medicine

## 2020-12-27 ENCOUNTER — Other Ambulatory Visit: Payer: Self-pay

## 2020-12-27 DIAGNOSIS — S80211D Abrasion, right knee, subsequent encounter: Secondary | ICD-10-CM | POA: Insufficient documentation

## 2020-12-27 DIAGNOSIS — L97418 Non-pressure chronic ulcer of right heel and midfoot with other specified severity: Secondary | ICD-10-CM | POA: Diagnosis not present

## 2020-12-27 DIAGNOSIS — L97218 Non-pressure chronic ulcer of right calf with other specified severity: Secondary | ICD-10-CM | POA: Insufficient documentation

## 2020-12-27 DIAGNOSIS — X58XXXD Exposure to other specified factors, subsequent encounter: Secondary | ICD-10-CM | POA: Insufficient documentation

## 2020-12-27 DIAGNOSIS — L97812 Non-pressure chronic ulcer of other part of right lower leg with fat layer exposed: Secondary | ICD-10-CM | POA: Diagnosis not present

## 2020-12-27 DIAGNOSIS — I252 Old myocardial infarction: Secondary | ICD-10-CM | POA: Diagnosis not present

## 2020-12-27 DIAGNOSIS — I2510 Atherosclerotic heart disease of native coronary artery without angina pectoris: Secondary | ICD-10-CM | POA: Diagnosis not present

## 2020-12-27 DIAGNOSIS — L89610 Pressure ulcer of right heel, unstageable: Secondary | ICD-10-CM | POA: Diagnosis not present

## 2020-12-27 DIAGNOSIS — L97512 Non-pressure chronic ulcer of other part of right foot with fat layer exposed: Secondary | ICD-10-CM | POA: Diagnosis not present

## 2020-12-27 DIAGNOSIS — L97419 Non-pressure chronic ulcer of right heel and midfoot with unspecified severity: Secondary | ICD-10-CM | POA: Diagnosis present

## 2020-12-27 DIAGNOSIS — E11621 Type 2 diabetes mellitus with foot ulcer: Secondary | ICD-10-CM | POA: Insufficient documentation

## 2020-12-27 DIAGNOSIS — L97518 Non-pressure chronic ulcer of other part of right foot with other specified severity: Secondary | ICD-10-CM | POA: Diagnosis not present

## 2020-12-27 DIAGNOSIS — I872 Venous insufficiency (chronic) (peripheral): Secondary | ICD-10-CM | POA: Diagnosis not present

## 2020-12-27 NOTE — Progress Notes (Signed)
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 ([id number]) Visit Report for 12/27/2020 Debridement Details Patient Name: Date of Service: [name] [name] [facility] F. 12/27/2020 9:45 A M Medical Record Number: [id number] Patient Account Number: [id number] Date of Birth/Sex: Treating RN: [birth year] (75 y.o. [name], [name] Primary Care Provider: [name], [name] Other Clinician: Referring Provider: Treating Provider/Extender: Bular Hickok [name], [name] Weeks in Treatment: 245 Debridement Performed for Assessment: Wound #25 Right,Lateral Lower Leg Performed By: Physician [name], MD Debridement Type: Debridement Severity of Tissue Pre Debridement: Fat layer exposed Level of Consciousness (Pre-procedure): Awake and Alert Pre-procedure Verification/Time Out Yes - 10:27 Taken: Start Time: 10:27 Pain Control: Lidocaine T Area Debrided (L x W): otal 5.5 (cm) x 2.5 (cm) = 13.75 (cm) Tissue and other material debrided: Viable, Non-Viable, Slough, Subcutaneous, Skin: Dermis , Skin: Epidermis, Slough Level: Skin/Subcutaneous Tissue Debridement Description: Excisional Instrument: Curette Bleeding: Minimum Hemostasis Achieved: Pressure End Time: 10:27 Procedural Pain: 0 Post Procedural Pain: 0 Response to Treatment: Procedure was tolerated well Level of Consciousness (Post- Awake and Alert procedure): Post Debridement Measurements of Total Wound Length: (cm) 5.5 Width: (cm) 2.5 Depth: (cm) 0.1 Volume: (cm) 1.08 Character of Wound/Ulcer Post Debridement: Improved Severity of Tissue Post Debridement: Fat layer exposed Post Procedure Diagnosis Same as Pre-procedure Electronic Signature(s) Signed: 12/27/2020 5:13:37 PM By: Linton Ham MD Signed: 12/27/2020 5:22:11 PM By: [name] Entered By: Linton Ham on 12/27/2020 10:52:39 -------------------------------------------------------------------------------- Debridement Details Patient Name: Date of Service: [name] Foley F. 12/27/2020  9:45 A M Medical Record Number: [id number] Patient Account Number: [id number] Date of Birth/Sex: Treating RN: Mar 18, 1946 (75 y.o. [name], [name] Primary Care Provider: [name], [name] Other Clinician: Referring Provider: Treating Provider/Extender: Shatyra Becka [name], [name] Weeks in Treatment: 245 Debridement Performed for Assessment: Wound #34 Right,Medial,Dorsal Foot Performed By: Physician [name], MD Debridement Type: Debridement Severity of Tissue Pre Debridement: Fat layer exposed Level of Consciousness (Pre-procedure): Awake and Alert Pre-procedure Verification/Time Out Yes - 10:27 Taken: Start Time: 10:27 Pain Control: Lidocaine T Area Debrided (L x W): otal 2.3 (cm) x 1.3 (cm) = 2.99 (cm) Tissue and other material debrided: Viable, Non-Viable, Slough, Subcutaneous, Skin: Dermis , Skin: Epidermis, Slough Level: Skin/Subcutaneous Tissue Debridement Description: Excisional Instrument: Curette Bleeding: Minimum Hemostasis Achieved: Pressure End Time: 10:27 Procedural Pain: 0 Post Procedural Pain: 0 Response to Treatment: Procedure was tolerated well Level of Consciousness (Post- Awake and Alert procedure): Post Debridement Measurements of Total Wound Length: (cm) 2.3 Width: (cm) 1.3 Depth: (cm) 0.7 Volume: (cm) 1.644 Character of Wound/Ulcer Post Debridement: Improved Severity of Tissue Post Debridement: Fat layer exposed Post Procedure Diagnosis Same as Pre-procedure Electronic Signature(s) Signed: 12/27/2020 5:13:37 PM By: Linton Ham MD Signed: 12/27/2020 5:22:11 PM By: [name] Entered By: Linton Ham on 12/27/2020 10:52:59 -------------------------------------------------------------------------------- Debridement Details Patient Name: Date of Service: [name] Foley F. 12/27/2020 9:45 A M Medical Record Number: [id number] Patient Account Number: [id number] Date of Birth/Sex: Treating RN: 1945-11-17 (75 y.o. [name], [name] Primary Care Provider: [name], [name] Other Clinician: Referring Provider: Treating Provider/Extender: Dawnita Molner [name], [name] Weeks in Treatment: 245 Debridement Performed for Assessment: Wound #39 Right Calcaneus Performed By: Physician [name], MD Debridement Type: Debridement Severity of Tissue Pre Debridement: Fat layer exposed Level of Consciousness (Pre-procedure): Awake and Alert Pre-procedure Verification/Time Out Yes - 10:27 Taken: Start Time: 10:27 Pain Control: Lidocaine T Area Debrided (L x W): otal 2.4 (cm) x 2.8 (cm) = 6.72 (cm) Tissue and other material debrided: Viable, Non-Viable, Slough, Subcutaneous, Skin:  the right dorsal foot. Fairly extensive area on the right lateral calf just above the ankle and he has an abrasion on his right dorsal knee 7/26; the patient has a deep punched out area on the right dorsal foot proximally with exposed tendon. He also had an area on the right lateral leg. He comes in today with a new wound on the right lateral heel. We have been using silver alginate all changed to silver collagen today. I have a note from vascular 11/08/2020.  He had a CT CO2 angiogram which I think was limited on the right leg. He is going to repeat this apparently for 1/65 I certainly agree with this 8/16; none of the patient's wounds looks as though it is making any progress. He has the deep punched out area on the right dorsal foot. Exposed tendon and necrotic material. I used scissors and pickups to remove superficial tendon and then a #5 curette to remove subcutaneous debris. I am still a tendon here. Nothing looks viable. NEW today he has abrasion injuries on his right patella which he says are from hitting it when he was getting in the car. I believe he has had areas here before. Area on the right lateral heel completely nonviable. Along the right lateral calf. This does not look too bad although not much different from last time. ANGIOGRAM booked for next week 9/6; patient had his right leg ANGIOGRAM on 8/25. This was done with CO2. There was no flow-limiting stenosis above the knee. In the below-knee popliteal artery had a focal stenosis of 70% that underwent angioplasty. Two-vessel runoff via the peroneal and posterior tibial. As well the right posterior tibial artery was angioplastied. The peroneal artery had no flow-limiting stenosis and fed the anterior tibial via collateral. The patient has a deep wound on the right dorsal foot. Again 100% covered in slough but with very little viable tissue over exposed tendon. He also has a area on the right lateral calcaneus, right lateral lower leg small abrasions anteriorly over the tibia and the right patella from a fall. We have been using Hydrofera Blue Electronic Signature(s) Signed: 12/27/2020 5:13:37 PM By: Linton Ham MD Entered By: Linton Ham on 12/27/2020 10:57:02 -------------------------------------------------------------------------------- Physical Exam Details Patient Name: Date of Service: [name] Foley F. 12/27/2020 9:45 A M Medical Record Number: [id number] Patient Account  Number: [id number] Date of Birth/Sex: Treating RN: [birth year] (75 y.o. [name] Primary Care Provider: [name], [name] Other Clinician: Referring Provider: Treating Provider/Extender: Kynleigh Artz [name], [name] Weeks in Treatment: 245 Constitutional Sitting or standing Blood Pressure is within target range for patient.. Pulse regular and within target range for patient.Marland Kitchen Respirations regular, non-labored and within target range.. Temperature is normal and within the target range for the patient.Marland Kitchen Appears in no distress. Notes Wound exam; right dorsal foot. Completely necrotic surface removed with a #5 curette unfortunately there is nothing but tendon here at the base of the wound. The area on the right lateral heel again nonviable material which I removed with a #5 curette. Finally on the right lateral lower leg some debris on the surface of this as well removed with a #5 curette. He has small surface wounds anteriorly on the tibia and on the right anterior patella all of which do not look threatening. These are superficial Electronic Signature(s) Signed: 12/27/2020 5:13:37 PM By: Linton Ham MD Entered By: Linton Ham on 12/27/2020 11:00:55 -------------------------------------------------------------------------------- Physician Orders Details Patient Name: Date of Service: [name] Foley F. 12/27/2020  [name] [name], [name] [name] ([id number]) Visit Report for 12/27/2020 Debridement Details Patient Name: Date of Service: [name] [name] [facility] F. 12/27/2020 9:45 A M Medical Record Number: [id number] Patient Account Number: [id number] Date of Birth/Sex: Treating RN: [birth year] (75 y.o. [name], [name] Primary Care Provider: [name], [name] Other Clinician: Referring Provider: Treating Provider/Extender: Bular Hickok [name], [name] Weeks in Treatment: 245 Debridement Performed for Assessment: Wound #25 Right,Lateral Lower Leg Performed By: Physician [name], MD Debridement Type: Debridement Severity of Tissue Pre Debridement: Fat layer exposed Level of Consciousness (Pre-procedure): Awake and Alert Pre-procedure Verification/Time Out Yes - 10:27 Taken: Start Time: 10:27 Pain Control: Lidocaine T Area Debrided (L x W): otal 5.5 (cm) x 2.5 (cm) = 13.75 (cm) Tissue and other material debrided: Viable, Non-Viable, Slough, Subcutaneous, Skin: Dermis , Skin: Epidermis, Slough Level: Skin/Subcutaneous Tissue Debridement Description: Excisional Instrument: Curette Bleeding: Minimum Hemostasis Achieved: Pressure End Time: 10:27 Procedural Pain: 0 Post Procedural Pain: 0 Response to Treatment: Procedure was tolerated well Level of Consciousness (Post- Awake and Alert procedure): Post Debridement Measurements of Total Wound Length: (cm) 5.5 Width: (cm) 2.5 Depth: (cm) 0.1 Volume: (cm) 1.08 Character of Wound/Ulcer Post Debridement: Improved Severity of Tissue Post Debridement: Fat layer exposed Post Procedure Diagnosis Same as Pre-procedure Electronic Signature(s) Signed: 12/27/2020 5:13:37 PM By: Linton Ham MD Signed: 12/27/2020 5:22:11 PM By: [name] Entered By: Linton Ham on 12/27/2020 10:52:39 -------------------------------------------------------------------------------- Debridement Details Patient Name: Date of Service: [name] Foley F. 12/27/2020  9:45 A M Medical Record Number: [id number] Patient Account Number: [id number] Date of Birth/Sex: Treating RN: Mar 18, 1946 (75 y.o. [name], [name] Primary Care Provider: [name], [name] Other Clinician: Referring Provider: Treating Provider/Extender: Shatyra Becka [name], [name] Weeks in Treatment: 245 Debridement Performed for Assessment: Wound #34 Right,Medial,Dorsal Foot Performed By: Physician [name], MD Debridement Type: Debridement Severity of Tissue Pre Debridement: Fat layer exposed Level of Consciousness (Pre-procedure): Awake and Alert Pre-procedure Verification/Time Out Yes - 10:27 Taken: Start Time: 10:27 Pain Control: Lidocaine T Area Debrided (L x W): otal 2.3 (cm) x 1.3 (cm) = 2.99 (cm) Tissue and other material debrided: Viable, Non-Viable, Slough, Subcutaneous, Skin: Dermis , Skin: Epidermis, Slough Level: Skin/Subcutaneous Tissue Debridement Description: Excisional Instrument: Curette Bleeding: Minimum Hemostasis Achieved: Pressure End Time: 10:27 Procedural Pain: 0 Post Procedural Pain: 0 Response to Treatment: Procedure was tolerated well Level of Consciousness (Post- Awake and Alert procedure): Post Debridement Measurements of Total Wound Length: (cm) 2.3 Width: (cm) 1.3 Depth: (cm) 0.7 Volume: (cm) 1.644 Character of Wound/Ulcer Post Debridement: Improved Severity of Tissue Post Debridement: Fat layer exposed Post Procedure Diagnosis Same as Pre-procedure Electronic Signature(s) Signed: 12/27/2020 5:13:37 PM By: Linton Ham MD Signed: 12/27/2020 5:22:11 PM By: [name] Entered By: Linton Ham on 12/27/2020 10:52:59 -------------------------------------------------------------------------------- Debridement Details Patient Name: Date of Service: [name] Foley F. 12/27/2020 9:45 A M Medical Record Number: [id number] Patient Account Number: [id number] Date of Birth/Sex: Treating RN: 1945-11-17 (75 y.o. [name], [name] Primary Care Provider: [name], [name] Other Clinician: Referring Provider: Treating Provider/Extender: Dawnita Molner [name], [name] Weeks in Treatment: 245 Debridement Performed for Assessment: Wound #39 Right Calcaneus Performed By: Physician [name], MD Debridement Type: Debridement Severity of Tissue Pre Debridement: Fat layer exposed Level of Consciousness (Pre-procedure): Awake and Alert Pre-procedure Verification/Time Out Yes - 10:27 Taken: Start Time: 10:27 Pain Control: Lidocaine T Area Debrided (L x W): otal 2.4 (cm) x 2.8 (cm) = 6.72 (cm) Tissue and other material debrided: Viable, Non-Viable, Slough, Subcutaneous, Skin:  [name] [name], [name] [name] ([id number]) Visit Report for 12/27/2020 Debridement Details Patient Name: Date of Service: [name] [name] [facility] F. 12/27/2020 9:45 A M Medical Record Number: [id number] Patient Account Number: [id number] Date of Birth/Sex: Treating RN: [birth year] (75 y.o. [name], [name] Primary Care Provider: [name], [name] Other Clinician: Referring Provider: Treating Provider/Extender: Bular Hickok [name], [name] Weeks in Treatment: 245 Debridement Performed for Assessment: Wound #25 Right,Lateral Lower Leg Performed By: Physician [name], MD Debridement Type: Debridement Severity of Tissue Pre Debridement: Fat layer exposed Level of Consciousness (Pre-procedure): Awake and Alert Pre-procedure Verification/Time Out Yes - 10:27 Taken: Start Time: 10:27 Pain Control: Lidocaine T Area Debrided (L x W): otal 5.5 (cm) x 2.5 (cm) = 13.75 (cm) Tissue and other material debrided: Viable, Non-Viable, Slough, Subcutaneous, Skin: Dermis , Skin: Epidermis, Slough Level: Skin/Subcutaneous Tissue Debridement Description: Excisional Instrument: Curette Bleeding: Minimum Hemostasis Achieved: Pressure End Time: 10:27 Procedural Pain: 0 Post Procedural Pain: 0 Response to Treatment: Procedure was tolerated well Level of Consciousness (Post- Awake and Alert procedure): Post Debridement Measurements of Total Wound Length: (cm) 5.5 Width: (cm) 2.5 Depth: (cm) 0.1 Volume: (cm) 1.08 Character of Wound/Ulcer Post Debridement: Improved Severity of Tissue Post Debridement: Fat layer exposed Post Procedure Diagnosis Same as Pre-procedure Electronic Signature(s) Signed: 12/27/2020 5:13:37 PM By: Linton Ham MD Signed: 12/27/2020 5:22:11 PM By: [name] Entered By: Linton Ham on 12/27/2020 10:52:39 -------------------------------------------------------------------------------- Debridement Details Patient Name: Date of Service: [name] Foley F. 12/27/2020  9:45 A M Medical Record Number: [id number] Patient Account Number: [id number] Date of Birth/Sex: Treating RN: Mar 18, 1946 (75 y.o. [name], [name] Primary Care Provider: [name], [name] Other Clinician: Referring Provider: Treating Provider/Extender: Shatyra Becka [name], [name] Weeks in Treatment: 245 Debridement Performed for Assessment: Wound #34 Right,Medial,Dorsal Foot Performed By: Physician [name], MD Debridement Type: Debridement Severity of Tissue Pre Debridement: Fat layer exposed Level of Consciousness (Pre-procedure): Awake and Alert Pre-procedure Verification/Time Out Yes - 10:27 Taken: Start Time: 10:27 Pain Control: Lidocaine T Area Debrided (L x W): otal 2.3 (cm) x 1.3 (cm) = 2.99 (cm) Tissue and other material debrided: Viable, Non-Viable, Slough, Subcutaneous, Skin: Dermis , Skin: Epidermis, Slough Level: Skin/Subcutaneous Tissue Debridement Description: Excisional Instrument: Curette Bleeding: Minimum Hemostasis Achieved: Pressure End Time: 10:27 Procedural Pain: 0 Post Procedural Pain: 0 Response to Treatment: Procedure was tolerated well Level of Consciousness (Post- Awake and Alert procedure): Post Debridement Measurements of Total Wound Length: (cm) 2.3 Width: (cm) 1.3 Depth: (cm) 0.7 Volume: (cm) 1.644 Character of Wound/Ulcer Post Debridement: Improved Severity of Tissue Post Debridement: Fat layer exposed Post Procedure Diagnosis Same as Pre-procedure Electronic Signature(s) Signed: 12/27/2020 5:13:37 PM By: Linton Ham MD Signed: 12/27/2020 5:22:11 PM By: [name] Entered By: Linton Ham on 12/27/2020 10:52:59 -------------------------------------------------------------------------------- Debridement Details Patient Name: Date of Service: [name] Foley F. 12/27/2020 9:45 A M Medical Record Number: [id number] Patient Account Number: [id number] Date of Birth/Sex: Treating RN: 1945-11-17 (75 y.o. [name], [name] Primary Care Provider: [name], [name] Other Clinician: Referring Provider: Treating Provider/Extender: Dawnita Molner [name], [name] Weeks in Treatment: 245 Debridement Performed for Assessment: Wound #39 Right Calcaneus Performed By: Physician [name], MD Debridement Type: Debridement Severity of Tissue Pre Debridement: Fat layer exposed Level of Consciousness (Pre-procedure): Awake and Alert Pre-procedure Verification/Time Out Yes - 10:27 Taken: Start Time: 10:27 Pain Control: Lidocaine T Area Debrided (L x W): otal 2.4 (cm) x 2.8 (cm) = 6.72 (cm) Tissue and other material debrided: Viable, Non-Viable, Slough, Subcutaneous, Skin:

## 2020-12-29 NOTE — Progress Notes (Signed)
F. 12/27/2020 9:45 A M Medical Record Number: [id number] Patient Account Number: [id number] Date of Birth/Sex: Treating RN: 1945/05/07 (75 y.o. [name] [name], [name] [name] Primary Care Jakori Burkett: [name], [name] Other Clinician: Referring Hedy Garro: Treating Suriya Kovarik/Extender: [name], [name] [name], [name] Weeks in Treatment: 245 Wound Status Wound Number: 39 Primary Pressure Ulcer Etiology: Wound Location: Right Calcaneus Secondary Arterial Insufficiency  Ulcer Wounding Event: Pressure Injury Etiology: Date Acquired: 11/15/2020 Wound Open Weeks Of Treatment: 6 Status: Clustered Wound: No Comorbid Anemia, Sleep Apnea, Arrhythmia, Congestive Heart Failure, History: Coronary Artery Disease, Hypertension, Myocardial Infarction, Type II Diabetes, Gout, Neuropathy Photos Wound Measurements Length: (cm) 2.4 Width: (cm) 2.8 Depth: (cm) 0.2 Area: (cm) 5.278 Volume: (cm) 1.056 % Reduction in Area: -107.4% % Reduction in Volume: -315.7% Epithelialization: None Wound Description Classification: Unstageable/Unclassified Wound Margin: Thickened Exudate Amount: Medium Exudate Type: Serosanguineous Exudate Color: red, brown Wound Bed Granulation Amount: None Present (0%) Necrotic Amount: Large (67-100%) Necrotic Quality: Eschar, Adherent Slough Foul Odor After Cleansing: No Slough/Fibrino Yes Exposed Structure Fascia Exposed: No Fat Layer (Subcutaneous Tissue) Exposed: No Tendon Exposed: No Muscle Exposed: No Joint Exposed: No Bone Exposed: No Treatment Notes Wound #39 (Calcaneus) Wound Laterality: Right Cleanser Wound Cleanser Discharge Instruction: Cleanse the wound with wound cleanser prior to applying a clean dressing using gauze sponges, not tissue or cotton balls. Soap and Water Discharge Instruction: May shower and wash wound with dial antibacterial soap and water prior to dressing change. Peri-Wound Care Triamcinolone 15 (g) Discharge Instruction: Use triamcinolone 15 (g) as directed Sween Lotion (Moisturizing lotion) Discharge Instruction: Apply moisturizing lotion as directed Topical Primary Dressing Hydrofera Blue Classic Foam, 4x4 in Discharge Instruction: Moisten with saline prior to applying to wound bed Secondary Dressing Woven Gauze Sponge, Non-Sterile 4x4 in Discharge Instruction: Apply over primary dressing as directed. ABD Pad, 5x9 Discharge Instruction: Apply over primary dressing as directed. Secured  With Compression Wrap Kerlix Roll 4.5x3.1 (in/yd) Discharge Instruction: Apply Kerlix and Coban compression as directed. Coban Self-Adherent Wrap 4x5 (in/yd) Discharge Instruction: Apply over Kerlix as directed. Compression Stockings Add-Ons Electronic Signature(s) Signed: 12/27/2020 5:22:11 PM By: [name] Signed: 12/29/2020 11:10:38 AM By: Sandre Kitty Entered By: Sandre Kitty on 12/27/2020 10:02:38 -------------------------------------------------------------------------------- Wound Assessment Details Patient Name: Date of Service: [name] Foley F. 12/27/2020 9:45 A M Medical Record Number: [id number] Patient Account Number: [id number] Date of Birth/Sex: Treating RN: 1945/12/20 (75 y.o. [name] [name], [name] [name] Primary Care Josemaria Brining: [name], [name] Other Clinician: Referring Khaleah Duer: Treating Alnita Aybar/Extender: [name], [name] [name], [name] Weeks in Treatment: 245 Wound Status Wound Number: 40 Primary Abrasion Etiology: Etiology: Wound Location: Right Knee Secondary Diabetic Wound/Ulcer of the Lower Extremity Wounding Event: Bump Etiology: Date Acquired: 12/02/2020 Wound Open Weeks Of Treatment: 3 Status: Clustered Wound: Yes Comorbid Anemia, Sleep Apnea, Arrhythmia, Congestive Heart Failure, History: Coronary Artery Disease, Hypertension, Myocardial Infarction, Type II Diabetes, Gout, Neuropathy Photos Wound Measurements Length: (cm) 1.1 Width: (cm) 0.6 Depth: (cm) 0.1 Clustered Quantity: 2 Area: (cm) 0.518 Volume: (cm) 0.052 % Reduction in Area: 82.4% % Reduction in Volume: 82.4% Epithelialization: Small (1-33%) Wound Description Classification: Full Thickness Without Exposed Support Structures Wound Margin: Distinct, outline attached Exudate Amount: Medium Exudate Type: Serosanguineous Exudate Color: red, brown Foul Odor After Cleansing: No Slough/Fibrino No Wound Bed Granulation Amount: Large (67-100%) Exposed Structure Granulation  Quality: Red Fascia Exposed: No Necrotic Amount: None Present (0%) Fat Layer (Subcutaneous Tissue) Exposed: Yes Tendon Exposed: No Muscle Exposed: No Joint Exposed: No Bone Exposed: No Treatment Notes Wound #40 (Knee) Wound Laterality: Right Cleanser Soap and  Water Discharge Instruction: May shower and wash wound with dial antibacterial soap and water prior to dressing change. Wound Cleanser Discharge Instruction: Cleanse the wound with wound cleanser prior to applying a clean dressing using gauze sponges, not tissue or cotton balls. Peri-Wound Care Topical Primary Dressing Hydrofera Blue Classic Foam, 2x2 in Discharge Instruction: Moisten with saline prior to applying to wound bed Secondary Dressing Zetuvit Plus Silicone Border Dressing 4x4 (in/in) Discharge Instruction: Apply silicone border over primary dressing as directed. Secured With Compression Wrap Compression Stockings Environmental education officer) Signed: 12/27/2020 5:22:11 PM By: [name] Signed: 12/29/2020 11:10:38 AM By: Sandre Kitty Entered By: Sandre Kitty on 12/27/2020 10:01:50 -------------------------------------------------------------------------------- Vitals Details Patient Name: Date of Service: [name] Foley F. 12/27/2020 9:45 A M Medical Record Number: [id number] Patient Account Number: [id number] Date of Birth/Sex: Treating RN: Dec 19, 1945 (75 y.o. [name] [name], [name] [name] Primary Care Leeroy Lovings: [name], [name] Other Clinician: Referring Kiyana Vazguez: Treating Shikha Bibb/Extender: [name], [name] [name], [name] Weeks in Treatment: 245 Vital Signs Time Taken: 09:50 Temperature (F): 98.6 Height (in): 74 Pulse (bpm): 84 Weight (lbs): 150 Respiratory Rate (breaths/min): 20 Body Mass Index (BMI): 19.3 Blood Pressure (mmHg): 135/72 Capillary Blood Glucose (mg/dl): 121 Reference Range: 80 - 120 mg / dl Electronic Signature(s) Signed: 12/29/2020 11:10:38 AM By: Sandre Kitty Entered  By: Sandre Kitty on 12/27/2020 09:51:11  F. 12/27/2020 9:45 A M Medical Record Number: [id number] Patient Account Number: [id number] Date of Birth/Sex: Treating RN: 1945/05/07 (75 y.o. [name] [name], [name] [name] Primary Care Jakori Burkett: [name], [name] Other Clinician: Referring Hedy Garro: Treating Suriya Kovarik/Extender: [name], [name] [name], [name] Weeks in Treatment: 245 Wound Status Wound Number: 39 Primary Pressure Ulcer Etiology: Wound Location: Right Calcaneus Secondary Arterial Insufficiency  Ulcer Wounding Event: Pressure Injury Etiology: Date Acquired: 11/15/2020 Wound Open Weeks Of Treatment: 6 Status: Clustered Wound: No Comorbid Anemia, Sleep Apnea, Arrhythmia, Congestive Heart Failure, History: Coronary Artery Disease, Hypertension, Myocardial Infarction, Type II Diabetes, Gout, Neuropathy Photos Wound Measurements Length: (cm) 2.4 Width: (cm) 2.8 Depth: (cm) 0.2 Area: (cm) 5.278 Volume: (cm) 1.056 % Reduction in Area: -107.4% % Reduction in Volume: -315.7% Epithelialization: None Wound Description Classification: Unstageable/Unclassified Wound Margin: Thickened Exudate Amount: Medium Exudate Type: Serosanguineous Exudate Color: red, brown Wound Bed Granulation Amount: None Present (0%) Necrotic Amount: Large (67-100%) Necrotic Quality: Eschar, Adherent Slough Foul Odor After Cleansing: No Slough/Fibrino Yes Exposed Structure Fascia Exposed: No Fat Layer (Subcutaneous Tissue) Exposed: No Tendon Exposed: No Muscle Exposed: No Joint Exposed: No Bone Exposed: No Treatment Notes Wound #39 (Calcaneus) Wound Laterality: Right Cleanser Wound Cleanser Discharge Instruction: Cleanse the wound with wound cleanser prior to applying a clean dressing using gauze sponges, not tissue or cotton balls. Soap and Water Discharge Instruction: May shower and wash wound with dial antibacterial soap and water prior to dressing change. Peri-Wound Care Triamcinolone 15 (g) Discharge Instruction: Use triamcinolone 15 (g) as directed Sween Lotion (Moisturizing lotion) Discharge Instruction: Apply moisturizing lotion as directed Topical Primary Dressing Hydrofera Blue Classic Foam, 4x4 in Discharge Instruction: Moisten with saline prior to applying to wound bed Secondary Dressing Woven Gauze Sponge, Non-Sterile 4x4 in Discharge Instruction: Apply over primary dressing as directed. ABD Pad, 5x9 Discharge Instruction: Apply over primary dressing as directed. Secured  With Compression Wrap Kerlix Roll 4.5x3.1 (in/yd) Discharge Instruction: Apply Kerlix and Coban compression as directed. Coban Self-Adherent Wrap 4x5 (in/yd) Discharge Instruction: Apply over Kerlix as directed. Compression Stockings Add-Ons Electronic Signature(s) Signed: 12/27/2020 5:22:11 PM By: [name] Signed: 12/29/2020 11:10:38 AM By: Sandre Kitty Entered By: Sandre Kitty on 12/27/2020 10:02:38 -------------------------------------------------------------------------------- Wound Assessment Details Patient Name: Date of Service: [name] Foley F. 12/27/2020 9:45 A M Medical Record Number: [id number] Patient Account Number: [id number] Date of Birth/Sex: Treating RN: 1945/12/20 (75 y.o. [name] [name], [name] [name] Primary Care Josemaria Brining: [name], [name] Other Clinician: Referring Khaleah Duer: Treating Alnita Aybar/Extender: [name], [name] [name], [name] Weeks in Treatment: 245 Wound Status Wound Number: 40 Primary Abrasion Etiology: Etiology: Wound Location: Right Knee Secondary Diabetic Wound/Ulcer of the Lower Extremity Wounding Event: Bump Etiology: Date Acquired: 12/02/2020 Wound Open Weeks Of Treatment: 3 Status: Clustered Wound: Yes Comorbid Anemia, Sleep Apnea, Arrhythmia, Congestive Heart Failure, History: Coronary Artery Disease, Hypertension, Myocardial Infarction, Type II Diabetes, Gout, Neuropathy Photos Wound Measurements Length: (cm) 1.1 Width: (cm) 0.6 Depth: (cm) 0.1 Clustered Quantity: 2 Area: (cm) 0.518 Volume: (cm) 0.052 % Reduction in Area: 82.4% % Reduction in Volume: 82.4% Epithelialization: Small (1-33%) Wound Description Classification: Full Thickness Without Exposed Support Structures Wound Margin: Distinct, outline attached Exudate Amount: Medium Exudate Type: Serosanguineous Exudate Color: red, brown Foul Odor After Cleansing: No Slough/Fibrino No Wound Bed Granulation Amount: Large (67-100%) Exposed Structure Granulation  Quality: Red Fascia Exposed: No Necrotic Amount: None Present (0%) Fat Layer (Subcutaneous Tissue) Exposed: Yes Tendon Exposed: No Muscle Exposed: No Joint Exposed: No Bone Exposed: No Treatment Notes Wound #40 (Knee) Wound Laterality: Right Cleanser Soap and  N/A N/A Post Debridement Volume: (cm) N/A N/A N/A Post Debridement Stage: N/A N/A N/A Procedures Performed: Treatment Notes Electronic Signature(s) Signed: 12/27/2020 5:13:37 PM By: Linton Ham MD Signed: 12/27/2020 5:22:11 PM  By: [name] Entered By: Linton Ham on 12/27/2020 10:52:26 -------------------------------------------------------------------------------- Multi-Disciplinary Care Plan Details Patient Name: Date of Service: [name] Foley F. 12/27/2020 9:45 A M Medical Record Number: [id number] Patient Account Number: [id number] Date of Birth/Sex: Treating RN: 1945/05/30 (75 y.o. [name] [name], [name] [name] Primary Care Esha Fincher: [name], [name] Other Clinician: Referring Danasha Melman: Treating Danessa Mensch/Extender: [name], [name] [name], [name] Weeks in Treatment: [place] reviewed with physician Active Inactive Wound/Skin Impairment Nursing Diagnoses: Impaired tissue integrity Goals: Patient/caregiver will verbalize understanding of skin care regimen Date Initiated: 11/19/2017 Target Resolution Date: 01/12/2021 Goal Status: Active Ulcer/skin breakdown will have a volume reduction of 30% by week 4 Date Initiated: 11/19/2017 Date Inactivated: 12/02/2017 Target Resolution Date: 12/22/2017 Goal Status: Unmet Unmet Reason: multipl comorbidities Interventions: Assess patient/caregiver ability to obtain necessary supplies Assess ulceration(s) every visit Provide education on ulcer and skin care Notes: Electronic Signature(s) Signed: 12/27/2020 5:22:11 PM By: [name] Entered By: [name] on 12/27/2020 10:14:44 -------------------------------------------------------------------------------- Pain Assessment Details Patient Name: Date of Service: [name] Foley F. 12/27/2020 9:45 A M Medical Record Number: [id number] Patient Account Number: [id number] Date of Birth/Sex: Treating RN: 1945/11/07 (75 y.o. [name] [name], [name] [name] Primary Care Jamani Eley: [name], [name] Other Clinician: Referring Ervin Rothbauer: Treating Lamone Ferrelli/Extender: [name], [name] [name], [name] Weeks in Treatment: 245 Active Problems Location of Pain Severity and Description of Pain Patient  Has Paino No Site Locations Pain Management and Medication Current Pain Management: Electronic Signature(s) Signed: 12/27/2020 5:22:11 PM By: [name] Signed: 12/29/2020 11:10:38 AM By: Sandre Kitty Entered By: Sandre Kitty on 12/27/2020 09:51:17 -------------------------------------------------------------------------------- Patient/Caregiver Education Details Patient Name: Date of Service: [name] [name] 9/6/2022andnbsp9:45 [place] Record Number: [id number] Patient Account Number: [id number] Date of Birth/Gender: Treating RN: 24-Mar-1946 (75 y.o. [name] [name] Primary Care Physician: [name], [name] Other Clinician: Referring Physician: Treating Physician/Extender: [name], [name] [name], [name] Weeks in Treatment: 245 Education Assessment Education Provided To: Patient Education Topics Provided Basic Hygiene: Methods: Explain/Verbal Responses: State content correctly Wound/Skin Impairment: Methods: Explain/Verbal Responses: State content correctly Electronic Signature(s) Signed: 12/27/2020 5:22:11 PM By: [name] Entered By: [name] on 12/27/2020 10:15:01 -------------------------------------------------------------------------------- Wound Assessment Details Patient Name: Date of Service: [name] Foley F. 12/27/2020 9:45 A M Medical Record Number: [id number] Patient Account Number: [id number] Date of Birth/Sex: Treating RN: 12/11/1945 (75 y.o. [name] [name], [name] [name] Primary Care Ishan Sanroman: [name], [name] Other Clinician: Referring Keslie Gritz: Treating Analyssa Downs/Extender: [name], [name] [name], [name] Weeks in Treatment: 245 Wound Status Wound Number: 25 Primary Venous Leg Ulcer Etiology: Wound Location: Right, Lateral Lower Leg Wound Open Wounding Event: Gradually Appeared Status: Date Acquired: 07/06/2019 Comorbid Anemia, Sleep Apnea, Arrhythmia, Congestive Heart Failure, Weeks Of Treatment: 75 History: Coronary  Artery Disease, Hypertension, Myocardial Infarction, Clustered Wound: No Type II Diabetes, Gout, Neuropathy Photos Wound Measurements Length: (cm) 5.5 Width: (cm) 2.5 Depth: (cm) 0.1 Area: (cm) 10.799 Volume: (cm) 1.08 % Reduction in Area: -295.1% % Reduction in Volume: -295.6% Epithelialization: None Wound Description Classification: Full Thickness Without Exposed Support Structures Wound Margin: Flat and Intact Exudate Amount: Large Exudate Type: Purulent Exudate Color: yellow, brown, green Foul Odor After Cleansing: No Slough/Fibrino Yes Wound Bed Granulation Amount: None Present (0%) Exposed Structure Necrotic Amount: Large (67-100%) Fascia Exposed: No Necrotic Quality: Adherent Slough Fat Layer (Subcutaneous Tissue) Exposed:  N/A N/A Post Debridement Volume: (cm) N/A N/A N/A Post Debridement Stage: N/A N/A N/A Procedures Performed: Treatment Notes Electronic Signature(s) Signed: 12/27/2020 5:13:37 PM By: Linton Ham MD Signed: 12/27/2020 5:22:11 PM  By: [name] Entered By: Linton Ham on 12/27/2020 10:52:26 -------------------------------------------------------------------------------- Multi-Disciplinary Care Plan Details Patient Name: Date of Service: [name] Foley F. 12/27/2020 9:45 A M Medical Record Number: [id number] Patient Account Number: [id number] Date of Birth/Sex: Treating RN: 1945/05/30 (75 y.o. [name] [name], [name] [name] Primary Care Esha Fincher: [name], [name] Other Clinician: Referring Danasha Melman: Treating Danessa Mensch/Extender: [name], [name] [name], [name] Weeks in Treatment: [place] reviewed with physician Active Inactive Wound/Skin Impairment Nursing Diagnoses: Impaired tissue integrity Goals: Patient/caregiver will verbalize understanding of skin care regimen Date Initiated: 11/19/2017 Target Resolution Date: 01/12/2021 Goal Status: Active Ulcer/skin breakdown will have a volume reduction of 30% by week 4 Date Initiated: 11/19/2017 Date Inactivated: 12/02/2017 Target Resolution Date: 12/22/2017 Goal Status: Unmet Unmet Reason: multipl comorbidities Interventions: Assess patient/caregiver ability to obtain necessary supplies Assess ulceration(s) every visit Provide education on ulcer and skin care Notes: Electronic Signature(s) Signed: 12/27/2020 5:22:11 PM By: [name] Entered By: [name] on 12/27/2020 10:14:44 -------------------------------------------------------------------------------- Pain Assessment Details Patient Name: Date of Service: [name] Foley F. 12/27/2020 9:45 A M Medical Record Number: [id number] Patient Account Number: [id number] Date of Birth/Sex: Treating RN: 1945/11/07 (75 y.o. [name] [name], [name] [name] Primary Care Jamani Eley: [name], [name] Other Clinician: Referring Ervin Rothbauer: Treating Lamone Ferrelli/Extender: [name], [name] [name], [name] Weeks in Treatment: 245 Active Problems Location of Pain Severity and Description of Pain Patient  Has Paino No Site Locations Pain Management and Medication Current Pain Management: Electronic Signature(s) Signed: 12/27/2020 5:22:11 PM By: [name] Signed: 12/29/2020 11:10:38 AM By: Sandre Kitty Entered By: Sandre Kitty on 12/27/2020 09:51:17 -------------------------------------------------------------------------------- Patient/Caregiver Education Details Patient Name: Date of Service: [name] [name] 9/6/2022andnbsp9:45 [place] Record Number: [id number] Patient Account Number: [id number] Date of Birth/Gender: Treating RN: 24-Mar-1946 (75 y.o. [name] [name] Primary Care Physician: [name], [name] Other Clinician: Referring Physician: Treating Physician/Extender: [name], [name] [name], [name] Weeks in Treatment: 245 Education Assessment Education Provided To: Patient Education Topics Provided Basic Hygiene: Methods: Explain/Verbal Responses: State content correctly Wound/Skin Impairment: Methods: Explain/Verbal Responses: State content correctly Electronic Signature(s) Signed: 12/27/2020 5:22:11 PM By: [name] Entered By: [name] on 12/27/2020 10:15:01 -------------------------------------------------------------------------------- Wound Assessment Details Patient Name: Date of Service: [name] Foley F. 12/27/2020 9:45 A M Medical Record Number: [id number] Patient Account Number: [id number] Date of Birth/Sex: Treating RN: 12/11/1945 (75 y.o. [name] [name], [name] [name] Primary Care Ishan Sanroman: [name], [name] Other Clinician: Referring Keslie Gritz: Treating Analyssa Downs/Extender: [name], [name] [name], [name] Weeks in Treatment: 245 Wound Status Wound Number: 25 Primary Venous Leg Ulcer Etiology: Wound Location: Right, Lateral Lower Leg Wound Open Wounding Event: Gradually Appeared Status: Date Acquired: 07/06/2019 Comorbid Anemia, Sleep Apnea, Arrhythmia, Congestive Heart Failure, Weeks Of Treatment: 75 History: Coronary  Artery Disease, Hypertension, Myocardial Infarction, Clustered Wound: No Type II Diabetes, Gout, Neuropathy Photos Wound Measurements Length: (cm) 5.5 Width: (cm) 2.5 Depth: (cm) 0.1 Area: (cm) 10.799 Volume: (cm) 1.08 % Reduction in Area: -295.1% % Reduction in Volume: -295.6% Epithelialization: None Wound Description Classification: Full Thickness Without Exposed Support Structures Wound Margin: Flat and Intact Exudate Amount: Large Exudate Type: Purulent Exudate Color: yellow, brown, green Foul Odor After Cleansing: No Slough/Fibrino Yes Wound Bed Granulation Amount: None Present (0%) Exposed Structure Necrotic Amount: Large (67-100%) Fascia Exposed: No Necrotic Quality: Adherent Slough Fat Layer (Subcutaneous Tissue) Exposed:  F. 12/27/2020 9:45 A M Medical Record Number: [id number] Patient Account Number: [id number] Date of Birth/Sex: Treating RN: 1945/05/07 (75 y.o. [name] [name], [name] [name] Primary Care Jakori Burkett: [name], [name] Other Clinician: Referring Hedy Garro: Treating Suriya Kovarik/Extender: [name], [name] [name], [name] Weeks in Treatment: 245 Wound Status Wound Number: 39 Primary Pressure Ulcer Etiology: Wound Location: Right Calcaneus Secondary Arterial Insufficiency  Ulcer Wounding Event: Pressure Injury Etiology: Date Acquired: 11/15/2020 Wound Open Weeks Of Treatment: 6 Status: Clustered Wound: No Comorbid Anemia, Sleep Apnea, Arrhythmia, Congestive Heart Failure, History: Coronary Artery Disease, Hypertension, Myocardial Infarction, Type II Diabetes, Gout, Neuropathy Photos Wound Measurements Length: (cm) 2.4 Width: (cm) 2.8 Depth: (cm) 0.2 Area: (cm) 5.278 Volume: (cm) 1.056 % Reduction in Area: -107.4% % Reduction in Volume: -315.7% Epithelialization: None Wound Description Classification: Unstageable/Unclassified Wound Margin: Thickened Exudate Amount: Medium Exudate Type: Serosanguineous Exudate Color: red, brown Wound Bed Granulation Amount: None Present (0%) Necrotic Amount: Large (67-100%) Necrotic Quality: Eschar, Adherent Slough Foul Odor After Cleansing: No Slough/Fibrino Yes Exposed Structure Fascia Exposed: No Fat Layer (Subcutaneous Tissue) Exposed: No Tendon Exposed: No Muscle Exposed: No Joint Exposed: No Bone Exposed: No Treatment Notes Wound #39 (Calcaneus) Wound Laterality: Right Cleanser Wound Cleanser Discharge Instruction: Cleanse the wound with wound cleanser prior to applying a clean dressing using gauze sponges, not tissue or cotton balls. Soap and Water Discharge Instruction: May shower and wash wound with dial antibacterial soap and water prior to dressing change. Peri-Wound Care Triamcinolone 15 (g) Discharge Instruction: Use triamcinolone 15 (g) as directed Sween Lotion (Moisturizing lotion) Discharge Instruction: Apply moisturizing lotion as directed Topical Primary Dressing Hydrofera Blue Classic Foam, 4x4 in Discharge Instruction: Moisten with saline prior to applying to wound bed Secondary Dressing Woven Gauze Sponge, Non-Sterile 4x4 in Discharge Instruction: Apply over primary dressing as directed. ABD Pad, 5x9 Discharge Instruction: Apply over primary dressing as directed. Secured  With Compression Wrap Kerlix Roll 4.5x3.1 (in/yd) Discharge Instruction: Apply Kerlix and Coban compression as directed. Coban Self-Adherent Wrap 4x5 (in/yd) Discharge Instruction: Apply over Kerlix as directed. Compression Stockings Add-Ons Electronic Signature(s) Signed: 12/27/2020 5:22:11 PM By: [name] Signed: 12/29/2020 11:10:38 AM By: Sandre Kitty Entered By: Sandre Kitty on 12/27/2020 10:02:38 -------------------------------------------------------------------------------- Wound Assessment Details Patient Name: Date of Service: [name] Foley F. 12/27/2020 9:45 A M Medical Record Number: [id number] Patient Account Number: [id number] Date of Birth/Sex: Treating RN: 1945/12/20 (75 y.o. [name] [name], [name] [name] Primary Care Josemaria Brining: [name], [name] Other Clinician: Referring Khaleah Duer: Treating Alnita Aybar/Extender: [name], [name] [name], [name] Weeks in Treatment: 245 Wound Status Wound Number: 40 Primary Abrasion Etiology: Etiology: Wound Location: Right Knee Secondary Diabetic Wound/Ulcer of the Lower Extremity Wounding Event: Bump Etiology: Date Acquired: 12/02/2020 Wound Open Weeks Of Treatment: 3 Status: Clustered Wound: Yes Comorbid Anemia, Sleep Apnea, Arrhythmia, Congestive Heart Failure, History: Coronary Artery Disease, Hypertension, Myocardial Infarction, Type II Diabetes, Gout, Neuropathy Photos Wound Measurements Length: (cm) 1.1 Width: (cm) 0.6 Depth: (cm) 0.1 Clustered Quantity: 2 Area: (cm) 0.518 Volume: (cm) 0.052 % Reduction in Area: 82.4% % Reduction in Volume: 82.4% Epithelialization: Small (1-33%) Wound Description Classification: Full Thickness Without Exposed Support Structures Wound Margin: Distinct, outline attached Exudate Amount: Medium Exudate Type: Serosanguineous Exudate Color: red, brown Foul Odor After Cleansing: No Slough/Fibrino No Wound Bed Granulation Amount: Large (67-100%) Exposed Structure Granulation  Quality: Red Fascia Exposed: No Necrotic Amount: None Present (0%) Fat Layer (Subcutaneous Tissue) Exposed: Yes Tendon Exposed: No Muscle Exposed: No Joint Exposed: No Bone Exposed: No Treatment Notes Wound #40 (Knee) Wound Laterality: Right Cleanser Soap and

## 2021-01-10 ENCOUNTER — Other Ambulatory Visit: Payer: Self-pay

## 2021-01-10 ENCOUNTER — Encounter (HOSPITAL_BASED_OUTPATIENT_CLINIC_OR_DEPARTMENT_OTHER): Payer: Medicare HMO | Admitting: Internal Medicine

## 2021-01-10 DIAGNOSIS — L89610 Pressure ulcer of right heel, unstageable: Secondary | ICD-10-CM | POA: Diagnosis not present

## 2021-01-10 DIAGNOSIS — S80211D Abrasion, right knee, subsequent encounter: Secondary | ICD-10-CM | POA: Diagnosis not present

## 2021-01-10 DIAGNOSIS — I2510 Atherosclerotic heart disease of native coronary artery without angina pectoris: Secondary | ICD-10-CM | POA: Diagnosis not present

## 2021-01-10 DIAGNOSIS — E11621 Type 2 diabetes mellitus with foot ulcer: Secondary | ICD-10-CM | POA: Diagnosis not present

## 2021-01-10 DIAGNOSIS — I252 Old myocardial infarction: Secondary | ICD-10-CM | POA: Diagnosis not present

## 2021-01-10 DIAGNOSIS — L97418 Non-pressure chronic ulcer of right heel and midfoot with other specified severity: Secondary | ICD-10-CM | POA: Diagnosis not present

## 2021-01-10 DIAGNOSIS — L97218 Non-pressure chronic ulcer of right calf with other specified severity: Secondary | ICD-10-CM | POA: Diagnosis not present

## 2021-01-10 DIAGNOSIS — L97512 Non-pressure chronic ulcer of other part of right foot with fat layer exposed: Secondary | ICD-10-CM | POA: Diagnosis not present

## 2021-01-10 DIAGNOSIS — L97518 Non-pressure chronic ulcer of other part of right foot with other specified severity: Secondary | ICD-10-CM | POA: Diagnosis not present

## 2021-01-10 NOTE — Progress Notes (Addendum)
Date of Service: [name] [name] 01/10/2021 9:45 A M Medical Record Number: [id number] Patient Account Number: [id number] Date of Birth/Sex: Treating RN: 1945/10/26 (75 y.o. [name] [name], [name] Primary Care [name] [name]: [name] [name] Other Clinician: Referring [name] [name]: Treating [name] [name]/Extender: [name] [name] [name] [name] Weeks in Treatment: 247 Wound Status Wound Number: 34 Primary Diabetic Wound/Ulcer of the Lower Extremity Etiology: Wound Location: Right, Medial, Dorsal Foot Wound Open Wounding Event: Gradually Appeared Status: Date Acquired: 06/14/2020 Comorbid Anemia,  Sleep Apnea, Arrhythmia, Congestive Heart Failure, Weeks Of Treatment: 30 History: Coronary Artery Disease, Hypertension, Myocardial Infarction, Clustered Wound: No Type II Diabetes, Gout, Neuropathy Photos Wound Measurements Length: (cm) 1.7 Width: (cm) 1.5 Depth: (cm) 0.4 Area: (cm) 2.003 Volume: (cm) 0.801 % Reduction in Area: -628.4% % Reduction in Volume: -2866.7% Epithelialization: None Tunneling: No Undermining: No Wound Description Classification: Grade 2 Wound Margin: Epibole Exudate Amount: Medium Exudate Type: Purulent Exudate Color: yellow, brown, green Foul Odor After Cleansing: No Slough/Fibrino Yes Wound Bed Granulation Amount: Small (1-33%) Exposed Structure Granulation Quality: Red Fascia Exposed: No Necrotic Amount: Large (67-100%) Fat Layer (Subcutaneous Tissue) Exposed: Yes Necrotic Quality: Adherent Slough Tendon Exposed: Yes Muscle Exposed: No Joint Exposed: No Bone Exposed: Yes Electronic Signature(s) Signed: 05/11/2021 12:39:41 PM By: [name] Hammock RN Previous Signature: 01/10/2021 4:02:45 PM Version By: Sandre Kitty Previous Signature: 01/10/2021 6:09:57 PM Version By: [name] Hammock RN Entered By: [name] [name] on 01/10/2021 18:18:11 -------------------------------------------------------------------------------- Wound Assessment Details Patient Name: Date of Service: [name] Foley F. 01/10/2021 9:45 A M Medical Record Number: [id number] Patient Account Number: [id number] Date of Birth/Sex: Treating RN: 1946-01-20 (75 y.o. 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 Wounding Event: Pressure Injury Etiology: Date Acquired: 11/15/2020 Wound Open Weeks Of  Treatment: 8 Status: Clustered Wound: No Comorbid Anemia, Sleep Apnea, Arrhythmia, Congestive Heart Failure, History: Coronary Artery Disease, Hypertension, Myocardial Infarction, Type II Diabetes, Gout, Neuropathy Photos Wound Measurements Length: (cm) 1.8 Width: (cm) 3.5 Depth: (cm) 0.2 Area: (cm) 4.948 Volume: (cm) 0.99 % Reduction in Area: -94.4% % Reduction in Volume: -289.8% Epithelialization: None Tunneling: No Undermining: No Wound Description Classification: Unstageable/Unclassified Wound Margin: Thickened Exudate Amount: Medium Exudate Type: Serosanguineous Exudate Color: red, brown Foul Odor After Cleansing: No Slough/Fibrino Yes Wound Bed Granulation Amount: None Present (0%) Exposed Structure Necrotic Amount: Large (67-100%) Fascia Exposed: No Necrotic Quality: Eschar, Adherent Slough Fat Layer (Subcutaneous Tissue) Exposed: No Tendon Exposed: No Muscle Exposed: No Joint Exposed: No Bone Exposed: No Electronic Signature(s) Signed: 01/10/2021 4:02:45 PM By: Sandre Kitty Signed: 01/10/2021 6:09:57 PM By: [name] Hammock RN Entered By: Sandre Kitty on 01/10/2021 10:22:15 -------------------------------------------------------------------------------- Wound Assessment Details Patient Name: Date of Service: [name] Foley F. 01/10/2021 9:45 A M Medical Record Number: [id number] Patient Account Number: [id number] Date of Birth/Sex: Treating RN: 1946-03-27 (75 y.o. [name] [name], [name] [name] Primary Care Nihira Puello: [name] [name] Other Clinician: Referring Danyelle Brookover: Treating Laquan Ludden/Extender: [name] [name] [name] [name] Weeks in Treatment: 247 Wound Status Wound Number: 40 Primary Abrasion Etiology: Wound Location: Right Knee Secondary Diabetic Wound/Ulcer of the Lower Extremity Wounding Event: Bump Etiology: Date Acquired: 12/02/2020 Wound Open Weeks Of Treatment: 5 Status: Clustered Wound: Yes Comorbid Anemia, Sleep Apnea, Arrhythmia,  Congestive Heart Failure, History: Coronary Artery Disease, Hypertension, Myocardial Infarction, Type II Diabetes, Gout, Neuropathy Photos Wound Measurements Length: (cm) 0.1 Width: (cm) 0.1 Depth: (cm)  0.1 Clustered Quantity: 2 Area: (cm) 0.008 Volume: (cm) 0.001 % Reduction in Area: 99.7% % Reduction in Volume: 99.7% Epithelialization: Small (1-33%) Wound Description Classification: Full Thickness Without Exposed Support Structures Wound Margin: Distinct, outline attached Exudate Amount: Medium Exudate Type: Serosanguineous Exudate Color: red, brown Foul Odor After Cleansing: No Slough/Fibrino No Wound Bed Granulation Amount: Large (67-100%) Exposed Structure Granulation Quality: Red Fascia Exposed: No Necrotic Amount: None Present (0%) Fat Layer (Subcutaneous Tissue) Exposed: Yes Tendon Exposed: No Muscle Exposed: No Joint Exposed: No Bone Exposed: No Electronic Signature(s) Signed: 01/10/2021 4:02:45 PM By: Sandre Kitty Signed: 01/10/2021 6:09:57 PM By: [name] Hammock RN Entered By: Sandre Kitty on 01/10/2021 10:17:31 -------------------------------------------------------------------------------- Vitals Details Patient Name: Date of Service: [name] Foley F. 01/10/2021 9:45 A M Medical Record Number: [id number] Patient Account Number: [id number] Date of Birth/Sex: Treating RN: May 27, 1945 (75 y.o. [name] [name], [name] [name] Primary Care General Wearing: [name] [name] Other Clinician: Referring [place] Spainhower: Treating Keino Placencia/Extender: [name] [name] [name] [name] Weeks in Treatment: 247 Vital Signs Time Taken: 10:00 Temperature (F): 98.4 Height (in): 74 Pulse (bpm): 73 Weight (lbs): 150 Respiratory Rate (breaths/min): 17 Body Mass Index (BMI): 19.3 Blood Pressure (mmHg): 116/63 Capillary Blood Glucose (mg/dl): 122 Reference Range: 80 - 120 mg / dl Electronic Signature(s) Signed: 01/10/2021 6:09:57 PM By: [name] Hammock RN Entered By: [name] [name] on 01/10/2021 10:08:25  Date of Service: [name] [name] 01/10/2021 9:45 A M Medical Record Number: [id number] Patient Account Number: [id number] Date of Birth/Sex: Treating RN: 1945/10/26 (75 y.o. [name] [name], [name] Primary Care [name] [name]: [name] [name] Other Clinician: Referring [name] [name]: Treating [name] [name]/Extender: [name] [name] [name] [name] Weeks in Treatment: 247 Wound Status Wound Number: 34 Primary Diabetic Wound/Ulcer of the Lower Extremity Etiology: Wound Location: Right, Medial, Dorsal Foot Wound Open Wounding Event: Gradually Appeared Status: Date Acquired: 06/14/2020 Comorbid Anemia,  Sleep Apnea, Arrhythmia, Congestive Heart Failure, Weeks Of Treatment: 30 History: Coronary Artery Disease, Hypertension, Myocardial Infarction, Clustered Wound: No Type II Diabetes, Gout, Neuropathy Photos Wound Measurements Length: (cm) 1.7 Width: (cm) 1.5 Depth: (cm) 0.4 Area: (cm) 2.003 Volume: (cm) 0.801 % Reduction in Area: -628.4% % Reduction in Volume: -2866.7% Epithelialization: None Tunneling: No Undermining: No Wound Description Classification: Grade 2 Wound Margin: Epibole Exudate Amount: Medium Exudate Type: Purulent Exudate Color: yellow, brown, green Foul Odor After Cleansing: No Slough/Fibrino Yes Wound Bed Granulation Amount: Small (1-33%) Exposed Structure Granulation Quality: Red Fascia Exposed: No Necrotic Amount: Large (67-100%) Fat Layer (Subcutaneous Tissue) Exposed: Yes Necrotic Quality: Adherent Slough Tendon Exposed: Yes Muscle Exposed: No Joint Exposed: No Bone Exposed: Yes Electronic Signature(s) Signed: 05/11/2021 12:39:41 PM By: [name] Hammock RN Previous Signature: 01/10/2021 4:02:45 PM Version By: Sandre Kitty Previous Signature: 01/10/2021 6:09:57 PM Version By: [name] Hammock RN Entered By: [name] [name] on 01/10/2021 18:18:11 -------------------------------------------------------------------------------- Wound Assessment Details Patient Name: Date of Service: [name] Foley F. 01/10/2021 9:45 A M Medical Record Number: [id number] Patient Account Number: [id number] Date of Birth/Sex: Treating RN: 1946-01-20 (75 y.o. 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 Wounding Event: Pressure Injury Etiology: Date Acquired: 11/15/2020 Wound Open Weeks Of  Treatment: 8 Status: Clustered Wound: No Comorbid Anemia, Sleep Apnea, Arrhythmia, Congestive Heart Failure, History: Coronary Artery Disease, Hypertension, Myocardial Infarction, Type II Diabetes, Gout, Neuropathy Photos Wound Measurements Length: (cm) 1.8 Width: (cm) 3.5 Depth: (cm) 0.2 Area: (cm) 4.948 Volume: (cm) 0.99 % Reduction in Area: -94.4% % Reduction in Volume: -289.8% Epithelialization: None Tunneling: No Undermining: No Wound Description Classification: Unstageable/Unclassified Wound Margin: Thickened Exudate Amount: Medium Exudate Type: Serosanguineous Exudate Color: red, brown Foul Odor After Cleansing: No Slough/Fibrino Yes Wound Bed Granulation Amount: None Present (0%) Exposed Structure Necrotic Amount: Large (67-100%) Fascia Exposed: No Necrotic Quality: Eschar, Adherent Slough Fat Layer (Subcutaneous Tissue) Exposed: No Tendon Exposed: No Muscle Exposed: No Joint Exposed: No Bone Exposed: No Electronic Signature(s) Signed: 01/10/2021 4:02:45 PM By: Sandre Kitty Signed: 01/10/2021 6:09:57 PM By: [name] Hammock RN Entered By: Sandre Kitty on 01/10/2021 10:22:15 -------------------------------------------------------------------------------- Wound Assessment Details Patient Name: Date of Service: [name] Foley F. 01/10/2021 9:45 A M Medical Record Number: [id number] Patient Account Number: [id number] Date of Birth/Sex: Treating RN: 1946-03-27 (75 y.o. [name] [name], [name] [name] Primary Care Nihira Puello: [name] [name] Other Clinician: Referring Danyelle Brookover: Treating Laquan Ludden/Extender: [name] [name] [name] [name] Weeks in Treatment: 247 Wound Status Wound Number: 40 Primary Abrasion Etiology: Wound Location: Right Knee Secondary Diabetic Wound/Ulcer of the Lower Extremity Wounding Event: Bump Etiology: Date Acquired: 12/02/2020 Wound Open Weeks Of Treatment: 5 Status: Clustered Wound: Yes Comorbid Anemia, Sleep Apnea, Arrhythmia,  Congestive Heart Failure, History: Coronary Artery Disease, Hypertension, Myocardial Infarction, Type II Diabetes, Gout, Neuropathy Photos Wound Measurements Length: (cm) 0.1 Width: (cm) 0.1 Depth: (cm)  RN Entered By: Linton Ham on 01/10/2021 12:41:00 -------------------------------------------------------------------------------- Multi-Disciplinary Care Plan Details Patient Name: Date of Service: [name] [facility] F. 01/10/2021 9:45 A M Medical Record Number: [id number] Patient Account Number: [id number] Date of Birth/Sex: Treating RN: [birth year] (75 y.o. [name] [name], [place] Primary Care Reighan Hipolito: [name] [name] Other Clinician: Referring Jahmeek Shirk: Treating Eathel Pajak/Extender: [name] [name] [name] [name] Weeks in Treatment: [place] reviewed with physician Active Inactive Wound/Skin Impairment Nursing Diagnoses: Impaired tissue integrity Goals: Patient/caregiver will verbalize understanding of skin care regimen Date Initiated: 11/19/2017 Target Resolution Date: 01/15/2021 Goal Status: Active Ulcer/skin breakdown will have a volume reduction of 30% by week 4 Date Initiated: 11/19/2017 Date Inactivated: 12/02/2017 Target Resolution Date: 12/22/2017 Goal Status: Unmet Unmet Reason: multipl comorbidities Interventions: Assess patient/caregiver ability to obtain necessary supplies Assess ulceration(s) every visit Provide education on ulcer and skin care Notes: Electronic Signature(s) Signed: 01/10/2021 6:09:57 PM By: [name] Hammock RN Entered By: [name] [name] on 01/10/2021 10:23:49 -------------------------------------------------------------------------------- Pain Assessment Details Patient Name: Date of Service: [name] Foley F. 01/10/2021 9:45 A M Medical Record Number: [id number] Patient Account Number: [id number] Date of Birth/Sex: Treating RN: Mar 31, 1946 (75 y.o. [name] [name], [name] [name] Primary Care  Dawson Albers: [name] [name] Other Clinician: Referring Riti Rollyson: Treating Jayr Lupercio/Extender: [name] [name] [name] [name] Weeks in Treatment: 247 Active Problems Location of Pain Severity and Description of Pain Patient Has Paino No Site Locations Pain Management and Medication Current Pain Management: Electronic Signature(s) Signed: 01/10/2021 6:09:57 PM By: [name] Hammock RN Entered By: [name] [name] on 01/10/2021 10:08:40 -------------------------------------------------------------------------------- Patient/Caregiver Education Details Patient Name: Date of Service: [name] [name] 9/20/2022andnbsp9:45 A M Medical Record Number: [id number] Patient Account Number: [id number] Date of Birth/Gender: Treating RN: [birth year] (75 y.o. [name] Primary Care Physician: [name] [name] Other Clinician: Referring Physician: Treating Physician/Extender: [name] [name] [name] [name] Weeks in Treatment: 247 Education Assessment Education Provided To: Patient Education Topics Provided Wound/Skin Impairment: Methods: Explain/Verbal Responses: Reinforcements needed, State content correctly Electronic Signature(s) Signed: 01/10/2021 6:09:57 PM By: [name] Hammock RN Entered By: [name] [name] on 01/10/2021 10:24:18 -------------------------------------------------------------------------------- Wound Assessment Details Patient Name: Date of Service: [name] Foley F. 01/10/2021 9:45 A M Medical Record Number: [id number] Patient Account Number: [id number] Date of Birth/Sex: Treating RN: 07/25/1945 (75 y.o. [name] [name], [place] Primary Care Jeb Schloemer: [name] [name] Other Clinician: Referring Ashiah Karpowicz: Treating Maghan Jessee/Extender: [name] [name] [name] [name] Weeks in Treatment: 247 Wound Status Wound Number: 25 Primary Venous Leg Ulcer Etiology: Wound Location: Right, Lateral Lower Leg Wound Open Wounding Event: Gradually Appeared Status: Date  Acquired: 07/06/2019 Comorbid Anemia, Sleep Apnea, Arrhythmia, Congestive Heart Failure, Weeks Of Treatment: 77 History: Coronary Artery Disease, Hypertension, Myocardial Infarction, Clustered Wound: No Type II Diabetes, Gout, Neuropathy Photos Wound Measurements Length: (cm) 5 Width: (cm) 2.4 Depth: (cm) 0.1 Area: (cm) 9.425 Volume: (cm) 0.942 % Reduction in Area: -244.9% % Reduction in Volume: -245.1% Epithelialization: None Tunneling: No Undermining: No Wound Description Classification: Full Thickness Without Exposed Support Structures Wound Margin: Flat and Intact Exudate Amount: Large Exudate Type: Purulent Exudate Color: yellow, brown, green Foul Odor After Cleansing: No Slough/Fibrino Yes Wound Bed Granulation Amount: Small (1-33%) Exposed Structure Granulation Quality: Red, Pink Fascia Exposed: No Necrotic Amount: Large (67-100%) Fat Layer (Subcutaneous Tissue) Exposed: Yes Necrotic Quality: Adherent Slough Tendon Exposed: No Muscle Exposed: No Joint Exposed: No Bone Exposed: No Electronic Signature(s) Signed: 01/10/2021 4:02:45 PM By: Sandre Kitty Signed: 01/10/2021 6:09:57 PM By: [name] Hammock RN Entered By: Sandre Kitty on 01/10/2021 10:19:53 -------------------------------------------------------------------------------- Wound Assessment Details Patient Name:  Date of Service: [name] [name] 01/10/2021 9:45 A M Medical Record Number: [id number] Patient Account Number: [id number] Date of Birth/Sex: Treating RN: 1945/10/26 (75 y.o. [name] [name], [name] Primary Care [name] [name]: [name] [name] Other Clinician: Referring [name] [name]: Treating [name] [name]/Extender: [name] [name] [name] [name] Weeks in Treatment: 247 Wound Status Wound Number: 34 Primary Diabetic Wound/Ulcer of the Lower Extremity Etiology: Wound Location: Right, Medial, Dorsal Foot Wound Open Wounding Event: Gradually Appeared Status: Date Acquired: 06/14/2020 Comorbid Anemia,  Sleep Apnea, Arrhythmia, Congestive Heart Failure, Weeks Of Treatment: 30 History: Coronary Artery Disease, Hypertension, Myocardial Infarction, Clustered Wound: No Type II Diabetes, Gout, Neuropathy Photos Wound Measurements Length: (cm) 1.7 Width: (cm) 1.5 Depth: (cm) 0.4 Area: (cm) 2.003 Volume: (cm) 0.801 % Reduction in Area: -628.4% % Reduction in Volume: -2866.7% Epithelialization: None Tunneling: No Undermining: No Wound Description Classification: Grade 2 Wound Margin: Epibole Exudate Amount: Medium Exudate Type: Purulent Exudate Color: yellow, brown, green Foul Odor After Cleansing: No Slough/Fibrino Yes Wound Bed Granulation Amount: Small (1-33%) Exposed Structure Granulation Quality: Red Fascia Exposed: No Necrotic Amount: Large (67-100%) Fat Layer (Subcutaneous Tissue) Exposed: Yes Necrotic Quality: Adherent Slough Tendon Exposed: Yes Muscle Exposed: No Joint Exposed: No Bone Exposed: Yes Electronic Signature(s) Signed: 05/11/2021 12:39:41 PM By: [name] Hammock RN Previous Signature: 01/10/2021 4:02:45 PM Version By: Sandre Kitty Previous Signature: 01/10/2021 6:09:57 PM Version By: [name] Hammock RN Entered By: [name] [name] on 01/10/2021 18:18:11 -------------------------------------------------------------------------------- Wound Assessment Details Patient Name: Date of Service: [name] Foley F. 01/10/2021 9:45 A M Medical Record Number: [id number] Patient Account Number: [id number] Date of Birth/Sex: Treating RN: 1946-01-20 (75 y.o. 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 Wounding Event: Pressure Injury Etiology: Date Acquired: 11/15/2020 Wound Open Weeks Of  Treatment: 8 Status: Clustered Wound: No Comorbid Anemia, Sleep Apnea, Arrhythmia, Congestive Heart Failure, History: Coronary Artery Disease, Hypertension, Myocardial Infarction, Type II Diabetes, Gout, Neuropathy Photos Wound Measurements Length: (cm) 1.8 Width: (cm) 3.5 Depth: (cm) 0.2 Area: (cm) 4.948 Volume: (cm) 0.99 % Reduction in Area: -94.4% % Reduction in Volume: -289.8% Epithelialization: None Tunneling: No Undermining: No Wound Description Classification: Unstageable/Unclassified Wound Margin: Thickened Exudate Amount: Medium Exudate Type: Serosanguineous Exudate Color: red, brown Foul Odor After Cleansing: No Slough/Fibrino Yes Wound Bed Granulation Amount: None Present (0%) Exposed Structure Necrotic Amount: Large (67-100%) Fascia Exposed: No Necrotic Quality: Eschar, Adherent Slough Fat Layer (Subcutaneous Tissue) Exposed: No Tendon Exposed: No Muscle Exposed: No Joint Exposed: No Bone Exposed: No Electronic Signature(s) Signed: 01/10/2021 4:02:45 PM By: Sandre Kitty Signed: 01/10/2021 6:09:57 PM By: [name] Hammock RN Entered By: Sandre Kitty on 01/10/2021 10:22:15 -------------------------------------------------------------------------------- Wound Assessment Details Patient Name: Date of Service: [name] Foley F. 01/10/2021 9:45 A M Medical Record Number: [id number] Patient Account Number: [id number] Date of Birth/Sex: Treating RN: 1946-03-27 (75 y.o. [name] [name], [name] [name] Primary Care Nihira Puello: [name] [name] Other Clinician: Referring Danyelle Brookover: Treating Laquan Ludden/Extender: [name] [name] [name] [name] Weeks in Treatment: 247 Wound Status Wound Number: 40 Primary Abrasion Etiology: Wound Location: Right Knee Secondary Diabetic Wound/Ulcer of the Lower Extremity Wounding Event: Bump Etiology: Date Acquired: 12/02/2020 Wound Open Weeks Of Treatment: 5 Status: Clustered Wound: Yes Comorbid Anemia, Sleep Apnea, Arrhythmia,  Congestive Heart Failure, History: Coronary Artery Disease, Hypertension, Myocardial Infarction, Type II Diabetes, Gout, Neuropathy Photos Wound Measurements Length: (cm) 0.1 Width: (cm) 0.1 Depth: (cm)  0.1 Clustered Quantity: 2 Area: (cm) 0.008 Volume: (cm) 0.001 % Reduction in Area: 99.7% % Reduction in Volume: 99.7% Epithelialization: Small (1-33%) Wound Description Classification: Full Thickness Without Exposed Support Structures Wound Margin: Distinct, outline attached Exudate Amount: Medium Exudate Type: Serosanguineous Exudate Color: red, brown Foul Odor After Cleansing: No Slough/Fibrino No Wound Bed Granulation Amount: Large (67-100%) Exposed Structure Granulation Quality: Red Fascia Exposed: No Necrotic Amount: None Present (0%) Fat Layer (Subcutaneous Tissue) Exposed: Yes Tendon Exposed: No Muscle Exposed: No Joint Exposed: No Bone Exposed: No Electronic Signature(s) Signed: 01/10/2021 4:02:45 PM By: Sandre Kitty Signed: 01/10/2021 6:09:57 PM By: [name] Hammock RN Entered By: Sandre Kitty on 01/10/2021 10:17:31 -------------------------------------------------------------------------------- Vitals Details Patient Name: Date of Service: [name] Foley F. 01/10/2021 9:45 A M Medical Record Number: [id number] Patient Account Number: [id number] Date of Birth/Sex: Treating RN: May 27, 1945 (75 y.o. [name] [name], [name] [name] Primary Care General Wearing: [name] [name] Other Clinician: Referring [place] Spainhower: Treating Keino Placencia/Extender: [name] [name] [name] [name] Weeks in Treatment: 247 Vital Signs Time Taken: 10:00 Temperature (F): 98.4 Height (in): 74 Pulse (bpm): 73 Weight (lbs): 150 Respiratory Rate (breaths/min): 17 Body Mass Index (BMI): 19.3 Blood Pressure (mmHg): 116/63 Capillary Blood Glucose (mg/dl): 122 Reference Range: 80 - 120 mg / dl Electronic Signature(s) Signed: 01/10/2021 6:09:57 PM By: [name] Hammock RN Entered By: [name] [name] on 01/10/2021 10:08:25
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
## 2021-01-16 ENCOUNTER — Ambulatory Visit (INDEPENDENT_AMBULATORY_CARE_PROVIDER_SITE_OTHER): Payer: Medicare HMO

## 2021-01-16 DIAGNOSIS — Z0000 Encounter for general adult medical examination without abnormal findings: Secondary | ICD-10-CM | POA: Diagnosis not present

## 2021-01-16 NOTE — Patient Instructions (Signed)
[name] [name] , Thank you for taking time to come for your Medicare Wellness Visit. I appreciate your ongoing commitment to your health goals. Please review the following plan we discussed and let me know if I can assist you in the future.   Screening recommendations/referrals: Colonoscopy: declined  Recommended yearly ophthalmology/optometry visit for glaucoma screening and checkup Recommended yearly dental visit for hygiene and checkup  Vaccinations: Influenza vaccine: due  Pneumococcal vaccine: completed series  Tdap vaccine: due with injury  Shingles vaccine: declined     Advanced directives: will provide copies   Conditions/risks identified: none   Next appointment: none   Preventive Care 74 Years and Older, Male Preventive care refers to lifestyle choices and visits with your health care provider that can promote health and wellness. What does preventive care include? A yearly physical exam. This is also called an annual well check. Dental exams once or twice a year. Routine eye exams. Ask your health care provider how often you should have your eyes checked. Personal lifestyle choices, including: Daily care of your teeth and gums. Regular physical activity. Eating a healthy diet. Avoiding tobacco and drug use. Limiting alcohol use. Practicing safe sex. Taking low doses of aspirin every day. Taking vitamin and mineral supplements as recommended by your health care provider. What happens during an annual well check? The services and screenings done by your health care provider during your annual well check will depend on your age, overall health, lifestyle risk factors, and family history of disease. Counseling  Your health care provider may ask you questions about your: Alcohol use. Tobacco use. Drug use. Emotional well-being. Home and relationship well-being. Sexual activity. Eating habits. History of falls. Memory and ability to understand (cognition). Work and  work Statistician. Screening  You may have the following tests or measurements: Height, weight, and BMI. Blood pressure. Lipid and cholesterol levels. These may be checked every 5 years, or more frequently if you are over 74 years old. Skin check. Lung cancer screening. You may have this screening every year starting at age 74 if you have a 30-pack-year history of smoking and currently smoke or have quit within the past 15 years. Fecal occult blood test (FOBT) of the stool. You may have this test every year starting at age 74. Flexible sigmoidoscopy or colonoscopy. You may have a sigmoidoscopy every 5 years or a colonoscopy every 10 years starting at age 74. Prostate cancer screening. Recommendations will vary depending on your family history and other risks. Hepatitis C blood test. Hepatitis B blood test. Sexually transmitted disease (STD) testing. Diabetes screening. This is done by checking your blood sugar (glucose) after you have not eaten for a while (fasting). You may have this done every 1-3 years. Abdominal aortic aneurysm (AAA) screening. You may need this if you are a current or former smoker. Osteoporosis. You may be screened starting at age 74 if you are at high risk. Talk with your health care provider about your test results, treatment options, and if necessary, the need for more tests. Vaccines  Your health care provider may recommend certain vaccines, such as: Influenza vaccine. This is recommended every year. Tetanus, diphtheria, and acellular pertussis (Tdap, Td) vaccine. You may need a Td booster every 10 years. Zoster vaccine. You may need this after age 34. Pneumococcal 13-valent conjugate (PCV13) vaccine. One dose is recommended after age 57. Pneumococcal polysaccharide (PPSV23) vaccine. One dose is recommended after age 32. Talk to your health care provider about which screenings and vaccines  you need and how often you need them. This information is not intended to  replace advice given to you by your health care provider. Make sure you discuss any questions you have with your health care provider. Document Released: 05/06/2015 Document Revised: 12/28/2015 Document Reviewed: 02/08/2015 Elsevier Interactive Patient Education  2017 [employer] Prevention in the Home Falls can cause injuries. They can happen to people of all ages. There are many things you can do to make your home safe and to help prevent falls. What can I do on the outside of my home? Regularly fix the edges of walkways and driveways and fix any cracks. Remove anything that might make you trip as you walk through a door, such as a raised step or threshold. Trim any bushes or trees on the path to your home. Use bright outdoor lighting. Clear any walking paths of anything that might make someone trip, such as rocks or tools. Regularly check to see if handrails are loose or broken. Make sure that both sides of any steps have handrails. Any raised decks and porches should have guardrails on the edges. Have any leaves, snow, or ice cleared regularly. Use sand or salt on walking paths during winter. Clean up any spills in your garage right away. This includes oil or grease spills. What can I do in the bathroom? Use night lights. Install grab bars by the toilet and in the tub and shower. Do not use towel bars as grab bars. Use non-skid mats or decals in the tub or shower. If you need to sit down in the shower, use a plastic, non-slip stool. Keep the floor dry. Clean up any water that spills on the floor as soon as it happens. Remove soap buildup in the tub or shower regularly. Attach bath mats securely with double-sided non-slip rug tape. Do not have throw rugs and other things on the floor that can make you trip. What can I do in the bedroom? Use night lights. Make sure that you have a light by your bed that is easy to reach. Do not use any sheets or blankets that are too big for  your bed. They should not hang down onto the floor. Have a firm chair that has side arms. You can use this for support while you get dressed. Do not have throw rugs and other things on the floor that can make you trip. What can I do in the kitchen? Clean up any spills right away. Avoid walking on wet floors. Keep items that you use a lot in easy-to-reach places. If you need to reach something above you, use a strong step stool that has a grab bar. Keep electrical cords out of the way. Do not use floor polish or wax that makes floors slippery. If you must use wax, use non-skid floor wax. Do not have throw rugs and other things on the floor that can make you trip. What can I do with my stairs? Do not leave any items on the stairs. Make sure that there are handrails on both sides of the stairs and use them. Fix handrails that are broken or loose. Make sure that handrails are as long as the stairways. Check any carpeting to make sure that it is firmly attached to the stairs. Fix any carpet that is loose or worn. Avoid having throw rugs at the top or bottom of the stairs. If you do have throw rugs, attach them to the floor with carpet tape. Make sure  that you have a light switch at the top of the stairs and the bottom of the stairs. If you do not have them, ask someone to add them for you. What else can I do to help prevent falls? Wear shoes that: Do not have high heels. Have rubber bottoms. Are comfortable and fit you well. Are closed at the toe. Do not wear sandals. If you use a stepladder: Make sure that it is fully opened. Do not climb a closed stepladder. Make sure that both sides of the stepladder are locked into place. Ask someone to hold it for you, if possible. Clearly mark and make sure that you can see: Any grab bars or handrails. First and last steps. Where the edge of each step is. Use tools that help you move around (mobility aids) if they are needed. These  include: Canes. Walkers. Scooters. Crutches. Turn on the lights when you go into a dark area. Replace any light bulbs as soon as they burn out. Set up your furniture so you have a clear path. Avoid moving your furniture around. If any of your floors are uneven, fix them. If there are any pets around you, be aware of where they are. Review your medicines with your doctor. Some medicines can make you feel dizzy. This can increase your chance of falling. Ask your doctor what other things that you can do to help prevent falls. This information is not intended to replace advice given to you by your health care provider. Make sure you discuss any questions you have with your health care provider. Document Released: 02/03/2009 Document Revised: 09/15/2015 Document Reviewed: 05/14/2014 Elsevier Interactive Patient Education  2017 [employer].
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
## 2021-01-17 ENCOUNTER — Encounter (HOSPITAL_COMMUNITY): Payer: Medicare HMO

## 2021-01-17 DIAGNOSIS — N312 Flaccid neuropathic bladder, not elsewhere classified: Secondary | ICD-10-CM | POA: Diagnosis not present

## 2021-01-23 ENCOUNTER — Other Ambulatory Visit: Payer: Self-pay | Admitting: Family Medicine

## 2021-01-24 ENCOUNTER — Other Ambulatory Visit: Payer: Self-pay

## 2021-01-24 ENCOUNTER — Encounter (HOSPITAL_BASED_OUTPATIENT_CLINIC_OR_DEPARTMENT_OTHER): Payer: Medicare HMO | Attending: Internal Medicine | Admitting: Internal Medicine

## 2021-01-24 DIAGNOSIS — E1165 Type 2 diabetes mellitus with hyperglycemia: Secondary | ICD-10-CM | POA: Diagnosis not present

## 2021-01-24 DIAGNOSIS — S80211A Abrasion, right knee, initial encounter: Secondary | ICD-10-CM | POA: Diagnosis not present

## 2021-01-24 DIAGNOSIS — L97518 Non-pressure chronic ulcer of other part of right foot with other specified severity: Secondary | ICD-10-CM | POA: Diagnosis not present

## 2021-01-24 DIAGNOSIS — R5381 Other malaise: Secondary | ICD-10-CM | POA: Diagnosis not present

## 2021-01-24 DIAGNOSIS — N183 Chronic kidney disease, stage 3 unspecified: Secondary | ICD-10-CM | POA: Diagnosis not present

## 2021-01-24 DIAGNOSIS — Z8673 Personal history of transient ischemic attack (TIA), and cerebral infarction without residual deficits: Secondary | ICD-10-CM | POA: Insufficient documentation

## 2021-01-24 DIAGNOSIS — I5042 Chronic combined systolic (congestive) and diastolic (congestive) heart failure: Secondary | ICD-10-CM | POA: Diagnosis not present

## 2021-01-24 DIAGNOSIS — L97418 Non-pressure chronic ulcer of right heel and midfoot with other specified severity: Secondary | ICD-10-CM | POA: Diagnosis not present

## 2021-01-24 DIAGNOSIS — L97218 Non-pressure chronic ulcer of right calf with other specified severity: Secondary | ICD-10-CM | POA: Diagnosis not present

## 2021-01-24 DIAGNOSIS — Z95810 Presence of automatic (implantable) cardiac defibrillator: Secondary | ICD-10-CM | POA: Insufficient documentation

## 2021-01-24 DIAGNOSIS — I130 Hypertensive heart and chronic kidney disease with heart failure and stage 1 through stage 4 chronic kidney disease, or unspecified chronic kidney disease: Secondary | ICD-10-CM | POA: Diagnosis not present

## 2021-01-24 DIAGNOSIS — E11621 Type 2 diabetes mellitus with foot ulcer: Secondary | ICD-10-CM | POA: Diagnosis not present

## 2021-01-24 DIAGNOSIS — E1122 Type 2 diabetes mellitus with diabetic chronic kidney disease: Secondary | ICD-10-CM | POA: Diagnosis not present

## 2021-01-24 DIAGNOSIS — L89610 Pressure ulcer of right heel, unstageable: Secondary | ICD-10-CM | POA: Diagnosis not present

## 2021-01-24 DIAGNOSIS — X58XXXA Exposure to other specified factors, initial encounter: Secondary | ICD-10-CM | POA: Insufficient documentation

## 2021-01-25 DIAGNOSIS — L89613 Pressure ulcer of right heel, stage 3: Secondary | ICD-10-CM | POA: Diagnosis not present

## 2021-01-25 DIAGNOSIS — L89522 Pressure ulcer of left ankle, stage 2: Secondary | ICD-10-CM | POA: Diagnosis not present

## 2021-01-25 DIAGNOSIS — L97321 Non-pressure chronic ulcer of left ankle limited to breakdown of skin: Secondary | ICD-10-CM | POA: Diagnosis not present

## 2021-01-25 DIAGNOSIS — L89512 Pressure ulcer of right ankle, stage 2: Secondary | ICD-10-CM | POA: Diagnosis not present

## 2021-01-25 DIAGNOSIS — L89620 Pressure ulcer of left heel, unstageable: Secondary | ICD-10-CM | POA: Diagnosis not present

## 2021-01-25 DIAGNOSIS — L97221 Non-pressure chronic ulcer of left calf limited to breakdown of skin: Secondary | ICD-10-CM | POA: Diagnosis not present

## 2021-01-25 DIAGNOSIS — L89623 Pressure ulcer of left heel, stage 3: Secondary | ICD-10-CM | POA: Diagnosis not present

## 2021-01-25 DIAGNOSIS — L97521 Non-pressure chronic ulcer of other part of left foot limited to breakdown of skin: Secondary | ICD-10-CM | POA: Diagnosis not present

## 2021-01-25 DIAGNOSIS — L97211 Non-pressure chronic ulcer of right calf limited to breakdown of skin: Secondary | ICD-10-CM | POA: Diagnosis not present

## 2021-01-25 NOTE — Progress Notes (Signed)
Wound Margin: Distinct, outline attached Exudate Amount: None Present Foul Odor After Cleansing: No Slough/Fibrino No Wound Bed Granulation Amount: Medium (34-66%) Exposed Structure Granulation Quality: Red Fascia Exposed: No Necrotic Amount: None  Present (0%) Fat Layer (Subcutaneous Tissue) Exposed: Yes Tendon Exposed: No Muscle Exposed: No Joint Exposed: No Bone Exposed: No Electronic Signature(s) Signed: 01/24/2021 5:33:33 PM By: Levan Hurst RN, BSN Entered By: Levan Hurst on 01/24/2021 10:52:17 -------------------------------------------------------------------------------- Wound Assessment Details Patient Name: Date of Service: [name] Foley F. 01/24/2021 10:30 A M Medical Record Number: [id number] Patient Account Number: [id number] Date of Birth/Sex: Treating RN: [birth year] (75 y.o. [name] [name] Primary Care Lathyn Griggs: [name], [name] Other Clinician: Referring Nola Botkins: Treating Zniyah Midkiff/Extender: [name], [name] [name], [name] Weeks in Treatment: 249 Wound Status Wound Number: 41 Primary Venous Leg Ulcer Etiology: Wound Location: Right, Distal, Anterior Lower Leg Wound Open Wounding Event: Gradually Appeared Status: Date Acquired: 01/24/2021 Comorbid Anemia, Sleep Apnea, Arrhythmia, Congestive Heart Failure, Weeks Of Treatment: 0 History: Coronary Artery Disease, Hypertension, Myocardial Infarction, Clustered Wound: No Type II Diabetes, Gout, Neuropathy Photos Wound Measurements Length: (cm) 0.8 Width: (cm) 0.4 Depth: (cm) 0.1 Area: (cm) 0.251 Volume: (cm) 0.025 % Reduction in Area: % Reduction in Volume: Epithelialization: None Tunneling: No Undermining: No Wound Description Classification: Full Thickness Without Exposed Support Structures Wound Margin: Flat and Intact Exudate Amount: Medium Exudate Type: Serosanguineous Exudate Color: red, brown Foul Odor After Cleansing: No Slough/Fibrino No Wound Bed Granulation Amount: Large (67-100%) Exposed Structure Granulation Quality: Pink, Pale Fascia Exposed: No Necrotic Amount: None Present (0%) Fat Layer (Subcutaneous Tissue) Exposed: Yes Tendon Exposed: No Muscle Exposed: No Joint Exposed: No Bone Exposed: No Treatment  Notes Wound #41 (Lower Leg) Wound Laterality: Right, Anterior, Distal Cleanser Wound Cleanser Discharge Instruction: Cleanse the wound with wound cleanser prior to applying a clean dressing using gauze sponges, not tissue or cotton balls. Soap and Water Discharge Instruction: May shower and wash wound with dial antibacterial soap and water prior to dressing change. Peri-Wound Care Triamcinolone 15 (g) Discharge Instruction: Use triamcinolone 15 (g) as directed Sween Lotion (Moisturizing lotion) Discharge Instruction: Apply moisturizing lotion as directed Topical Primary Dressing PolyMem Silver Non-Adhesive Dressing, 4.25x4.25 in Discharge Instruction: Apply to wound bed as instructed Secondary Dressing Woven Gauze Sponge, Non-Sterile 4x4 in Discharge Instruction: Apply over primary dressing as directed. Secured With Compression Wrap Kerlix Roll 4.5x3.1 (in/yd) Discharge Instruction: Apply Kerlix and Coban compression as directed. Coban Self-Adherent Wrap 4x5 (in/yd) Discharge Instruction: Apply over Kerlix as directed. Compression Stockings Add-Ons Electronic Signature(s) Signed: 01/24/2021 5:33:33 PM By: Levan Hurst RN, BSN Entered By: Levan Hurst on 01/24/2021 10:51:20 -------------------------------------------------------------------------------- Wound Assessment Details Patient Name: Date of Service: [name] Foley F. 01/24/2021 10:30 A M Medical Record Number: [id number] Patient Account Number: [id number] Date of Birth/Sex: Treating RN: [birth year] (75 y.o. [name] [name] Primary Care Evalyse Stroope: [name], [name] Other Clinician: Referring Bodee Lafoe: Treating Mallisa Alameda/Extender: [name], [name] [name], [name] Weeks in Treatment: 249 Wound Status Wound Number: 42 Primary Venous Leg Ulcer Etiology: Wound Location: Right, Proximal, Anterior Lower Leg Wound Open Wounding Event: Gradually Appeared Status: Date Acquired: 01/24/2021 Comorbid Anemia, Sleep Apnea,  Arrhythmia, Congestive Heart Failure, Weeks Of Treatment: 0 History: Coronary Artery Disease, Hypertension, Myocardial Infarction, Clustered Wound: No Type II Diabetes, Gout, Neuropathy Photos Wound Measurements Length: (cm) 1.5 Width: (cm) 0.9 Depth: (cm) 0.1 Area: (cm) 1.06 Volume: (cm) 0.106 % Reduction in Area: % Reduction in Volume: Epithelialization: None Tunneling: No Undermining: No Wound Description Classification: Full Thickness Without Exposed Support Structures Wound  Kerlix Roll 4.5x3.1 (in/yd) Discharge Instruction: Apply Kerlix and Coban compression as directed. Coban Self-Adherent Wrap 4x5 (in/yd) Discharge Instruction: Apply over Kerlix as directed. Compression Stockings Add-Ons Electronic Signature(s) Signed: 01/24/2021 5:33:33 PM By: Levan Hurst RN, BSN Entered By: Levan Hurst on 01/24/2021 11:03:45 -------------------------------------------------------------------------------- Wound Assessment Details Patient Name: Date of Service: [name] Foley F. 01/24/2021 10:30 A M Medical Record Number: [id number] Patient Account Number: [id number] Date of Birth/Sex: Treating RN: 03-28-1946 (75 y.o. [name] [name] Primary Care Ines Rebel: [name], [name] Other Clinician: Referring Vernice Bowker: Treating Fleeta Kunde/Extender: [name], [name] [name], [name] Weeks in Treatment: 249 Wound Status Wound Number: 39 Primary Pressure Ulcer Etiology: Wound Location: Right Calcaneus Secondary Arterial Insufficiency Ulcer Wounding Event: Pressure Injury Etiology: Date Acquired: 11/15/2020 Wound  Open Weeks Of Treatment: 10 Status: Clustered Wound: No Comorbid Anemia, Sleep Apnea, Arrhythmia, Congestive Heart Failure, History: Coronary Artery Disease, Hypertension, Myocardial Infarction, Type II Diabetes, Gout, Neuropathy Photos Wound Measurements Length: (cm) 1.9 Width: (cm) 3.4 Depth: (cm) 0.5 Area: (cm) 5.074 Volume: (cm) 2.537 % Reduction in Area: -99.4% % Reduction in Volume: -898.8% Epithelialization: None Tunneling: No Undermining: No Wound Description Classification: Unstageable/Unclassified Wound Margin: Thickened Exudate Amount: Medium Exudate Type: Purulent Exudate Color: yellow, brown, green Foul Odor After Cleansing: No Slough/Fibrino Yes Wound Bed Granulation Amount: Small (1-33%) Exposed Structure Granulation Quality: Pink, Pale Fascia Exposed: No Necrotic Amount: Large (67-100%) Fat Layer (Subcutaneous Tissue) Exposed: Yes Necrotic Quality: Eschar, Adherent Slough Tendon Exposed: No Muscle Exposed: No Joint Exposed: No Bone Exposed: No Treatment Notes Wound #39 (Calcaneus) Wound Laterality: Right Cleanser Wound Cleanser Discharge Instruction: Cleanse the wound with wound cleanser prior to applying a clean dressing using gauze sponges, not tissue or cotton balls. Soap and Water Discharge Instruction: May shower and wash wound with dial antibacterial soap and water prior to dressing change. Peri-Wound Care Triamcinolone 15 (g) Discharge Instruction: Use triamcinolone 15 (g) as directed Sween Lotion (Moisturizing lotion) Discharge Instruction: Apply moisturizing lotion as directed Topical Primary Dressing PolyMem Silver Non-Adhesive Dressing, 4.25x4.25 in Discharge Instruction: Apply to wound bed as instructed Secondary Dressing Woven Gauze Sponge, Non-Sterile 4x4 in Discharge Instruction: Apply over primary dressing as directed. ALLEVYN Heel 4 1/2in x 5 1/2in / 10.5cm x 13.5cm Discharge Instruction: Apply over primary dressing as  directed. Secured With Compression Wrap Kerlix Roll 4.5x3.1 (in/yd) Discharge Instruction: Apply Kerlix and Coban compression as directed. Coban Self-Adherent Wrap 4x5 (in/yd) Discharge Instruction: Apply over Kerlix as directed. Compression Stockings Add-Ons Electronic Signature(s) Signed: 01/24/2021 5:33:33 PM By: Levan Hurst RN, BSN Entered By: Levan Hurst on 01/24/2021 10:56:32 -------------------------------------------------------------------------------- Wound Assessment Details Patient Name: Date of Service: [name] Foley F. 01/24/2021 10:30 A M Medical Record Number: [id number] Patient Account Number: [id number] Date of Birth/Sex: Treating RN: March 12, 1946 (75 y.o. [name] [name] Primary Care Kalina Morabito: [name], [name] Other Clinician: Referring Crystallee Werden: Treating Devery Odwyer/Extender: [name], [name] [name], [name] Weeks in Treatment: 249 Wound Status Wound Number: 40 Primary Abrasion Etiology: Wound Location: Right Knee Secondary Diabetic Wound/Ulcer of the Lower Extremity Wounding Event: Bump Etiology: Date Acquired: 12/02/2020 Wound Healed - Epithelialized Weeks Of Treatment: 7 Status: Clustered Wound: Yes Comorbid Anemia, Sleep Apnea, Arrhythmia, Congestive Heart Failure, History: Coronary Artery Disease, Hypertension, Myocardial Infarction, Type II Diabetes, Gout, Neuropathy Photos Wound Measurements Length: (cm) Width: (cm) Depth: (cm) Clustered Quantity: Area: (cm) Volume: (cm) 0 % Reduction in Area: 100% 0 % Reduction in Volume: 100% 0 Epithelialization: Large (67-100%) 2 Tunneling: No 0 Undermining: No 0 Wound Description Classification: Full Thickness Without Exposed Support Structures  Kerlix Roll 4.5x3.1 (in/yd) Discharge Instruction: Apply Kerlix and Coban compression as directed. Coban Self-Adherent Wrap 4x5 (in/yd) Discharge Instruction: Apply over Kerlix as directed. Compression Stockings Add-Ons Electronic Signature(s) Signed: 01/24/2021 5:33:33 PM By: Levan Hurst RN, BSN Entered By: Levan Hurst on 01/24/2021 11:03:45 -------------------------------------------------------------------------------- Wound Assessment Details Patient Name: Date of Service: [name] Foley F. 01/24/2021 10:30 A M Medical Record Number: [id number] Patient Account Number: [id number] Date of Birth/Sex: Treating RN: 03-28-1946 (75 y.o. [name] [name] Primary Care Ines Rebel: [name], [name] Other Clinician: Referring Vernice Bowker: Treating Fleeta Kunde/Extender: [name], [name] [name], [name] Weeks in Treatment: 249 Wound Status Wound Number: 39 Primary Pressure Ulcer Etiology: Wound Location: Right Calcaneus Secondary Arterial Insufficiency Ulcer Wounding Event: Pressure Injury Etiology: Date Acquired: 11/15/2020 Wound  Open Weeks Of Treatment: 10 Status: Clustered Wound: No Comorbid Anemia, Sleep Apnea, Arrhythmia, Congestive Heart Failure, History: Coronary Artery Disease, Hypertension, Myocardial Infarction, Type II Diabetes, Gout, Neuropathy Photos Wound Measurements Length: (cm) 1.9 Width: (cm) 3.4 Depth: (cm) 0.5 Area: (cm) 5.074 Volume: (cm) 2.537 % Reduction in Area: -99.4% % Reduction in Volume: -898.8% Epithelialization: None Tunneling: No Undermining: No Wound Description Classification: Unstageable/Unclassified Wound Margin: Thickened Exudate Amount: Medium Exudate Type: Purulent Exudate Color: yellow, brown, green Foul Odor After Cleansing: No Slough/Fibrino Yes Wound Bed Granulation Amount: Small (1-33%) Exposed Structure Granulation Quality: Pink, Pale Fascia Exposed: No Necrotic Amount: Large (67-100%) Fat Layer (Subcutaneous Tissue) Exposed: Yes Necrotic Quality: Eschar, Adherent Slough Tendon Exposed: No Muscle Exposed: No Joint Exposed: No Bone Exposed: No Treatment Notes Wound #39 (Calcaneus) Wound Laterality: Right Cleanser Wound Cleanser Discharge Instruction: Cleanse the wound with wound cleanser prior to applying a clean dressing using gauze sponges, not tissue or cotton balls. Soap and Water Discharge Instruction: May shower and wash wound with dial antibacterial soap and water prior to dressing change. Peri-Wound Care Triamcinolone 15 (g) Discharge Instruction: Use triamcinolone 15 (g) as directed Sween Lotion (Moisturizing lotion) Discharge Instruction: Apply moisturizing lotion as directed Topical Primary Dressing PolyMem Silver Non-Adhesive Dressing, 4.25x4.25 in Discharge Instruction: Apply to wound bed as instructed Secondary Dressing Woven Gauze Sponge, Non-Sterile 4x4 in Discharge Instruction: Apply over primary dressing as directed. ALLEVYN Heel 4 1/2in x 5 1/2in / 10.5cm x 13.5cm Discharge Instruction: Apply over primary dressing as  directed. Secured With Compression Wrap Kerlix Roll 4.5x3.1 (in/yd) Discharge Instruction: Apply Kerlix and Coban compression as directed. Coban Self-Adherent Wrap 4x5 (in/yd) Discharge Instruction: Apply over Kerlix as directed. Compression Stockings Add-Ons Electronic Signature(s) Signed: 01/24/2021 5:33:33 PM By: Levan Hurst RN, BSN Entered By: Levan Hurst on 01/24/2021 10:56:32 -------------------------------------------------------------------------------- Wound Assessment Details Patient Name: Date of Service: [name] Foley F. 01/24/2021 10:30 A M Medical Record Number: [id number] Patient Account Number: [id number] Date of Birth/Sex: Treating RN: March 12, 1946 (75 y.o. [name] [name] Primary Care Kalina Morabito: [name], [name] Other Clinician: Referring Crystallee Werden: Treating Devery Odwyer/Extender: [name], [name] [name], [name] Weeks in Treatment: 249 Wound Status Wound Number: 40 Primary Abrasion Etiology: Wound Location: Right Knee Secondary Diabetic Wound/Ulcer of the Lower Extremity Wounding Event: Bump Etiology: Date Acquired: 12/02/2020 Wound Healed - Epithelialized Weeks Of Treatment: 7 Status: Clustered Wound: Yes Comorbid Anemia, Sleep Apnea, Arrhythmia, Congestive Heart Failure, History: Coronary Artery Disease, Hypertension, Myocardial Infarction, Type II Diabetes, Gout, Neuropathy Photos Wound Measurements Length: (cm) Width: (cm) Depth: (cm) Clustered Quantity: Area: (cm) Volume: (cm) 0 % Reduction in Area: 100% 0 % Reduction in Volume: 100% 0 Epithelialization: Large (67-100%) 2 Tunneling: No 0 Undermining: No 0 Wound Description Classification: Full Thickness Without Exposed Support Structures  Kerlix Roll 4.5x3.1 (in/yd) Discharge Instruction: Apply Kerlix and Coban compression as directed. Coban Self-Adherent Wrap 4x5 (in/yd) Discharge Instruction: Apply over Kerlix as directed. Compression Stockings Add-Ons Electronic Signature(s) Signed: 01/24/2021 5:33:33 PM By: Levan Hurst RN, BSN Entered By: Levan Hurst on 01/24/2021 11:03:45 -------------------------------------------------------------------------------- Wound Assessment Details Patient Name: Date of Service: [name] Foley F. 01/24/2021 10:30 A M Medical Record Number: [id number] Patient Account Number: [id number] Date of Birth/Sex: Treating RN: 03-28-1946 (75 y.o. [name] [name] Primary Care Ines Rebel: [name], [name] Other Clinician: Referring Vernice Bowker: Treating Fleeta Kunde/Extender: [name], [name] [name], [name] Weeks in Treatment: 249 Wound Status Wound Number: 39 Primary Pressure Ulcer Etiology: Wound Location: Right Calcaneus Secondary Arterial Insufficiency Ulcer Wounding Event: Pressure Injury Etiology: Date Acquired: 11/15/2020 Wound  Open Weeks Of Treatment: 10 Status: Clustered Wound: No Comorbid Anemia, Sleep Apnea, Arrhythmia, Congestive Heart Failure, History: Coronary Artery Disease, Hypertension, Myocardial Infarction, Type II Diabetes, Gout, Neuropathy Photos Wound Measurements Length: (cm) 1.9 Width: (cm) 3.4 Depth: (cm) 0.5 Area: (cm) 5.074 Volume: (cm) 2.537 % Reduction in Area: -99.4% % Reduction in Volume: -898.8% Epithelialization: None Tunneling: No Undermining: No Wound Description Classification: Unstageable/Unclassified Wound Margin: Thickened Exudate Amount: Medium Exudate Type: Purulent Exudate Color: yellow, brown, green Foul Odor After Cleansing: No Slough/Fibrino Yes Wound Bed Granulation Amount: Small (1-33%) Exposed Structure Granulation Quality: Pink, Pale Fascia Exposed: No Necrotic Amount: Large (67-100%) Fat Layer (Subcutaneous Tissue) Exposed: Yes Necrotic Quality: Eschar, Adherent Slough Tendon Exposed: No Muscle Exposed: No Joint Exposed: No Bone Exposed: No Treatment Notes Wound #39 (Calcaneus) Wound Laterality: Right Cleanser Wound Cleanser Discharge Instruction: Cleanse the wound with wound cleanser prior to applying a clean dressing using gauze sponges, not tissue or cotton balls. Soap and Water Discharge Instruction: May shower and wash wound with dial antibacterial soap and water prior to dressing change. Peri-Wound Care Triamcinolone 15 (g) Discharge Instruction: Use triamcinolone 15 (g) as directed Sween Lotion (Moisturizing lotion) Discharge Instruction: Apply moisturizing lotion as directed Topical Primary Dressing PolyMem Silver Non-Adhesive Dressing, 4.25x4.25 in Discharge Instruction: Apply to wound bed as instructed Secondary Dressing Woven Gauze Sponge, Non-Sterile 4x4 in Discharge Instruction: Apply over primary dressing as directed. ALLEVYN Heel 4 1/2in x 5 1/2in / 10.5cm x 13.5cm Discharge Instruction: Apply over primary dressing as  directed. Secured With Compression Wrap Kerlix Roll 4.5x3.1 (in/yd) Discharge Instruction: Apply Kerlix and Coban compression as directed. Coban Self-Adherent Wrap 4x5 (in/yd) Discharge Instruction: Apply over Kerlix as directed. Compression Stockings Add-Ons Electronic Signature(s) Signed: 01/24/2021 5:33:33 PM By: Levan Hurst RN, BSN Entered By: Levan Hurst on 01/24/2021 10:56:32 -------------------------------------------------------------------------------- Wound Assessment Details Patient Name: Date of Service: [name] Foley F. 01/24/2021 10:30 A M Medical Record Number: [id number] Patient Account Number: [id number] Date of Birth/Sex: Treating RN: March 12, 1946 (75 y.o. [name] [name] Primary Care Kalina Morabito: [name], [name] Other Clinician: Referring Crystallee Werden: Treating Devery Odwyer/Extender: [name], [name] [name], [name] Weeks in Treatment: 249 Wound Status Wound Number: 40 Primary Abrasion Etiology: Wound Location: Right Knee Secondary Diabetic Wound/Ulcer of the Lower Extremity Wounding Event: Bump Etiology: Date Acquired: 12/02/2020 Wound Healed - Epithelialized Weeks Of Treatment: 7 Status: Clustered Wound: Yes Comorbid Anemia, Sleep Apnea, Arrhythmia, Congestive Heart Failure, History: Coronary Artery Disease, Hypertension, Myocardial Infarction, Type II Diabetes, Gout, Neuropathy Photos Wound Measurements Length: (cm) Width: (cm) Depth: (cm) Clustered Quantity: Area: (cm) Volume: (cm) 0 % Reduction in Area: 100% 0 % Reduction in Volume: 100% 0 Epithelialization: Large (67-100%) 2 Tunneling: No 0 Undermining: No 0 Wound Description Classification: Full Thickness Without Exposed Support Structures  Wound Margin: Distinct, outline attached Exudate Amount: None Present Foul Odor After Cleansing: No Slough/Fibrino No Wound Bed Granulation Amount: Medium (34-66%) Exposed Structure Granulation Quality: Red Fascia Exposed: No Necrotic Amount: None  Present (0%) Fat Layer (Subcutaneous Tissue) Exposed: Yes Tendon Exposed: No Muscle Exposed: No Joint Exposed: No Bone Exposed: No Electronic Signature(s) Signed: 01/24/2021 5:33:33 PM By: Levan Hurst RN, BSN Entered By: Levan Hurst on 01/24/2021 10:52:17 -------------------------------------------------------------------------------- Wound Assessment Details Patient Name: Date of Service: [name] Foley F. 01/24/2021 10:30 A M Medical Record Number: [id number] Patient Account Number: [id number] Date of Birth/Sex: Treating RN: [birth year] (75 y.o. [name] [name] Primary Care Lathyn Griggs: [name], [name] Other Clinician: Referring Nola Botkins: Treating Zniyah Midkiff/Extender: [name], [name] [name], [name] Weeks in Treatment: 249 Wound Status Wound Number: 41 Primary Venous Leg Ulcer Etiology: Wound Location: Right, Distal, Anterior Lower Leg Wound Open Wounding Event: Gradually Appeared Status: Date Acquired: 01/24/2021 Comorbid Anemia, Sleep Apnea, Arrhythmia, Congestive Heart Failure, Weeks Of Treatment: 0 History: Coronary Artery Disease, Hypertension, Myocardial Infarction, Clustered Wound: No Type II Diabetes, Gout, Neuropathy Photos Wound Measurements Length: (cm) 0.8 Width: (cm) 0.4 Depth: (cm) 0.1 Area: (cm) 0.251 Volume: (cm) 0.025 % Reduction in Area: % Reduction in Volume: Epithelialization: None Tunneling: No Undermining: No Wound Description Classification: Full Thickness Without Exposed Support Structures Wound Margin: Flat and Intact Exudate Amount: Medium Exudate Type: Serosanguineous Exudate Color: red, brown Foul Odor After Cleansing: No Slough/Fibrino No Wound Bed Granulation Amount: Large (67-100%) Exposed Structure Granulation Quality: Pink, Pale Fascia Exposed: No Necrotic Amount: None Present (0%) Fat Layer (Subcutaneous Tissue) Exposed: Yes Tendon Exposed: No Muscle Exposed: No Joint Exposed: No Bone Exposed: No Treatment  Notes Wound #41 (Lower Leg) Wound Laterality: Right, Anterior, Distal Cleanser Wound Cleanser Discharge Instruction: Cleanse the wound with wound cleanser prior to applying a clean dressing using gauze sponges, not tissue or cotton balls. Soap and Water Discharge Instruction: May shower and wash wound with dial antibacterial soap and water prior to dressing change. Peri-Wound Care Triamcinolone 15 (g) Discharge Instruction: Use triamcinolone 15 (g) as directed Sween Lotion (Moisturizing lotion) Discharge Instruction: Apply moisturizing lotion as directed Topical Primary Dressing PolyMem Silver Non-Adhesive Dressing, 4.25x4.25 in Discharge Instruction: Apply to wound bed as instructed Secondary Dressing Woven Gauze Sponge, Non-Sterile 4x4 in Discharge Instruction: Apply over primary dressing as directed. Secured With Compression Wrap Kerlix Roll 4.5x3.1 (in/yd) Discharge Instruction: Apply Kerlix and Coban compression as directed. Coban Self-Adherent Wrap 4x5 (in/yd) Discharge Instruction: Apply over Kerlix as directed. Compression Stockings Add-Ons Electronic Signature(s) Signed: 01/24/2021 5:33:33 PM By: Levan Hurst RN, BSN Entered By: Levan Hurst on 01/24/2021 10:51:20 -------------------------------------------------------------------------------- Wound Assessment Details Patient Name: Date of Service: [name] Foley F. 01/24/2021 10:30 A M Medical Record Number: [id number] Patient Account Number: [id number] Date of Birth/Sex: Treating RN: [birth year] (75 y.o. [name] [name] Primary Care Evalyse Stroope: [name], [name] Other Clinician: Referring Bodee Lafoe: Treating Mallisa Alameda/Extender: [name], [name] [name], [name] Weeks in Treatment: 249 Wound Status Wound Number: 42 Primary Venous Leg Ulcer Etiology: Wound Location: Right, Proximal, Anterior Lower Leg Wound Open Wounding Event: Gradually Appeared Status: Date Acquired: 01/24/2021 Comorbid Anemia, Sleep Apnea,  Arrhythmia, Congestive Heart Failure, Weeks Of Treatment: 0 History: Coronary Artery Disease, Hypertension, Myocardial Infarction, Clustered Wound: No Type II Diabetes, Gout, Neuropathy Photos Wound Measurements Length: (cm) 1.5 Width: (cm) 0.9 Depth: (cm) 0.1 Area: (cm) 1.06 Volume: (cm) 0.106 % Reduction in Area: % Reduction in Volume: Epithelialization: None Tunneling: No Undermining: No Wound Description Classification: Full Thickness Without Exposed Support Structures Wound  Kerlix Roll 4.5x3.1 (in/yd) Discharge Instruction: Apply Kerlix and Coban compression as directed. Coban Self-Adherent Wrap 4x5 (in/yd) Discharge Instruction: Apply over Kerlix as directed. Compression Stockings Add-Ons Electronic Signature(s) Signed: 01/24/2021 5:33:33 PM By: Levan Hurst RN, BSN Entered By: Levan Hurst on 01/24/2021 11:03:45 -------------------------------------------------------------------------------- Wound Assessment Details Patient Name: Date of Service: [name] Foley F. 01/24/2021 10:30 A M Medical Record Number: [id number] Patient Account Number: [id number] Date of Birth/Sex: Treating RN: 03-28-1946 (75 y.o. [name] [name] Primary Care Ines Rebel: [name], [name] Other Clinician: Referring Vernice Bowker: Treating Fleeta Kunde/Extender: [name], [name] [name], [name] Weeks in Treatment: 249 Wound Status Wound Number: 39 Primary Pressure Ulcer Etiology: Wound Location: Right Calcaneus Secondary Arterial Insufficiency Ulcer Wounding Event: Pressure Injury Etiology: Date Acquired: 11/15/2020 Wound  Open Weeks Of Treatment: 10 Status: Clustered Wound: No Comorbid Anemia, Sleep Apnea, Arrhythmia, Congestive Heart Failure, History: Coronary Artery Disease, Hypertension, Myocardial Infarction, Type II Diabetes, Gout, Neuropathy Photos Wound Measurements Length: (cm) 1.9 Width: (cm) 3.4 Depth: (cm) 0.5 Area: (cm) 5.074 Volume: (cm) 2.537 % Reduction in Area: -99.4% % Reduction in Volume: -898.8% Epithelialization: None Tunneling: No Undermining: No Wound Description Classification: Unstageable/Unclassified Wound Margin: Thickened Exudate Amount: Medium Exudate Type: Purulent Exudate Color: yellow, brown, green Foul Odor After Cleansing: No Slough/Fibrino Yes Wound Bed Granulation Amount: Small (1-33%) Exposed Structure Granulation Quality: Pink, Pale Fascia Exposed: No Necrotic Amount: Large (67-100%) Fat Layer (Subcutaneous Tissue) Exposed: Yes Necrotic Quality: Eschar, Adherent Slough Tendon Exposed: No Muscle Exposed: No Joint Exposed: No Bone Exposed: No Treatment Notes Wound #39 (Calcaneus) Wound Laterality: Right Cleanser Wound Cleanser Discharge Instruction: Cleanse the wound with wound cleanser prior to applying a clean dressing using gauze sponges, not tissue or cotton balls. Soap and Water Discharge Instruction: May shower and wash wound with dial antibacterial soap and water prior to dressing change. Peri-Wound Care Triamcinolone 15 (g) Discharge Instruction: Use triamcinolone 15 (g) as directed Sween Lotion (Moisturizing lotion) Discharge Instruction: Apply moisturizing lotion as directed Topical Primary Dressing PolyMem Silver Non-Adhesive Dressing, 4.25x4.25 in Discharge Instruction: Apply to wound bed as instructed Secondary Dressing Woven Gauze Sponge, Non-Sterile 4x4 in Discharge Instruction: Apply over primary dressing as directed. ALLEVYN Heel 4 1/2in x 5 1/2in / 10.5cm x 13.5cm Discharge Instruction: Apply over primary dressing as  directed. Secured With Compression Wrap Kerlix Roll 4.5x3.1 (in/yd) Discharge Instruction: Apply Kerlix and Coban compression as directed. Coban Self-Adherent Wrap 4x5 (in/yd) Discharge Instruction: Apply over Kerlix as directed. Compression Stockings Add-Ons Electronic Signature(s) Signed: 01/24/2021 5:33:33 PM By: Levan Hurst RN, BSN Entered By: Levan Hurst on 01/24/2021 10:56:32 -------------------------------------------------------------------------------- Wound Assessment Details Patient Name: Date of Service: [name] Foley F. 01/24/2021 10:30 A M Medical Record Number: [id number] Patient Account Number: [id number] Date of Birth/Sex: Treating RN: March 12, 1946 (75 y.o. [name] [name] Primary Care Kalina Morabito: [name], [name] Other Clinician: Referring Crystallee Werden: Treating Devery Odwyer/Extender: [name], [name] [name], [name] Weeks in Treatment: 249 Wound Status Wound Number: 40 Primary Abrasion Etiology: Wound Location: Right Knee Secondary Diabetic Wound/Ulcer of the Lower Extremity Wounding Event: Bump Etiology: Date Acquired: 12/02/2020 Wound Healed - Epithelialized Weeks Of Treatment: 7 Status: Clustered Wound: Yes Comorbid Anemia, Sleep Apnea, Arrhythmia, Congestive Heart Failure, History: Coronary Artery Disease, Hypertension, Myocardial Infarction, Type II Diabetes, Gout, Neuropathy Photos Wound Measurements Length: (cm) Width: (cm) Depth: (cm) Clustered Quantity: Area: (cm) Volume: (cm) 0 % Reduction in Area: 100% 0 % Reduction in Volume: 100% 0 Epithelialization: Large (67-100%) 2 Tunneling: No 0 Undermining: No 0 Wound Description Classification: Full Thickness Without Exposed Support Structures  Kerlix Roll 4.5x3.1 (in/yd) Discharge Instruction: Apply Kerlix and Coban compression as directed. Coban Self-Adherent Wrap 4x5 (in/yd) Discharge Instruction: Apply over Kerlix as directed. Compression Stockings Add-Ons Electronic Signature(s) Signed: 01/24/2021 5:33:33 PM By: Levan Hurst RN, BSN Entered By: Levan Hurst on 01/24/2021 11:03:45 -------------------------------------------------------------------------------- Wound Assessment Details Patient Name: Date of Service: [name] Foley F. 01/24/2021 10:30 A M Medical Record Number: [id number] Patient Account Number: [id number] Date of Birth/Sex: Treating RN: 03-28-1946 (75 y.o. [name] [name] Primary Care Ines Rebel: [name], [name] Other Clinician: Referring Vernice Bowker: Treating Fleeta Kunde/Extender: [name], [name] [name], [name] Weeks in Treatment: 249 Wound Status Wound Number: 39 Primary Pressure Ulcer Etiology: Wound Location: Right Calcaneus Secondary Arterial Insufficiency Ulcer Wounding Event: Pressure Injury Etiology: Date Acquired: 11/15/2020 Wound  Open Weeks Of Treatment: 10 Status: Clustered Wound: No Comorbid Anemia, Sleep Apnea, Arrhythmia, Congestive Heart Failure, History: Coronary Artery Disease, Hypertension, Myocardial Infarction, Type II Diabetes, Gout, Neuropathy Photos Wound Measurements Length: (cm) 1.9 Width: (cm) 3.4 Depth: (cm) 0.5 Area: (cm) 5.074 Volume: (cm) 2.537 % Reduction in Area: -99.4% % Reduction in Volume: -898.8% Epithelialization: None Tunneling: No Undermining: No Wound Description Classification: Unstageable/Unclassified Wound Margin: Thickened Exudate Amount: Medium Exudate Type: Purulent Exudate Color: yellow, brown, green Foul Odor After Cleansing: No Slough/Fibrino Yes Wound Bed Granulation Amount: Small (1-33%) Exposed Structure Granulation Quality: Pink, Pale Fascia Exposed: No Necrotic Amount: Large (67-100%) Fat Layer (Subcutaneous Tissue) Exposed: Yes Necrotic Quality: Eschar, Adherent Slough Tendon Exposed: No Muscle Exposed: No Joint Exposed: No Bone Exposed: No Treatment Notes Wound #39 (Calcaneus) Wound Laterality: Right Cleanser Wound Cleanser Discharge Instruction: Cleanse the wound with wound cleanser prior to applying a clean dressing using gauze sponges, not tissue or cotton balls. Soap and Water Discharge Instruction: May shower and wash wound with dial antibacterial soap and water prior to dressing change. Peri-Wound Care Triamcinolone 15 (g) Discharge Instruction: Use triamcinolone 15 (g) as directed Sween Lotion (Moisturizing lotion) Discharge Instruction: Apply moisturizing lotion as directed Topical Primary Dressing PolyMem Silver Non-Adhesive Dressing, 4.25x4.25 in Discharge Instruction: Apply to wound bed as instructed Secondary Dressing Woven Gauze Sponge, Non-Sterile 4x4 in Discharge Instruction: Apply over primary dressing as directed. ALLEVYN Heel 4 1/2in x 5 1/2in / 10.5cm x 13.5cm Discharge Instruction: Apply over primary dressing as  directed. Secured With Compression Wrap Kerlix Roll 4.5x3.1 (in/yd) Discharge Instruction: Apply Kerlix and Coban compression as directed. Coban Self-Adherent Wrap 4x5 (in/yd) Discharge Instruction: Apply over Kerlix as directed. Compression Stockings Add-Ons Electronic Signature(s) Signed: 01/24/2021 5:33:33 PM By: Levan Hurst RN, BSN Entered By: Levan Hurst on 01/24/2021 10:56:32 -------------------------------------------------------------------------------- Wound Assessment Details Patient Name: Date of Service: [name] Foley F. 01/24/2021 10:30 A M Medical Record Number: [id number] Patient Account Number: [id number] Date of Birth/Sex: Treating RN: March 12, 1946 (75 y.o. [name] [name] Primary Care Kalina Morabito: [name], [name] Other Clinician: Referring Crystallee Werden: Treating Devery Odwyer/Extender: [name], [name] [name], [name] Weeks in Treatment: 249 Wound Status Wound Number: 40 Primary Abrasion Etiology: Wound Location: Right Knee Secondary Diabetic Wound/Ulcer of the Lower Extremity Wounding Event: Bump Etiology: Date Acquired: 12/02/2020 Wound Healed - Epithelialized Weeks Of Treatment: 7 Status: Clustered Wound: Yes Comorbid Anemia, Sleep Apnea, Arrhythmia, Congestive Heart Failure, History: Coronary Artery Disease, Hypertension, Myocardial Infarction, Type II Diabetes, Gout, Neuropathy Photos Wound Measurements Length: (cm) Width: (cm) Depth: (cm) Clustered Quantity: Area: (cm) Volume: (cm) 0 % Reduction in Area: 100% 0 % Reduction in Volume: 100% 0 Epithelialization: Large (67-100%) 2 Tunneling: No 0 Undermining: No 0 Wound Description Classification: Full Thickness Without Exposed Support Structures

## 2021-01-25 NOTE — Progress Notes (Signed)
supposed to have a follow-up with vascular however for some reason this is been delayed till 10/25. 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 He is not complaining of pain. Some of the wounds including In his right dorsal foot actually looks some better but the improvement is minimal Electronic Signature(s) Signed: 01/25/2021 10:14:24 AM By: Linton Ham MD Entered By: Linton Ham on 01/24/2021 11:15:49 -------------------------------------------------------------------------------- Physical Exam Details Patient Name: Date of Service: [name] [name] F. 01/24/2021 10:30 A M Medical Record Number: [id number] Patient Account Number: [id number] Date of Birth/Sex: Treating RN: Dec 24, 1945 (75 y.o. [name] [name] Primary Care Provider: [name], [name] Other Clinician: Referring Provider: Treating Provider/Extender: [name] [name] [name] [name] Weeks in Treatment: 249 Constitutional Sitting or standing Blood Pressure is within target range for patient.. Pulse regular and within target range for patient.Marland Kitchen Respirations regular, non-labored and within target range.. Temperature is normal and within the target range for the patient.Marland Kitchen Appears in no distress. Notes Wound exam; right dorsal foot has a better looking surface. No debridement is required right lateral leg again a reasonably clean surface I think the dimensions here may be slightly smaller. The area on the right  lateral heel has completely necrotic surface. Debrided with a #5 curette then pickups and scissors into subcutaneous tissue. Electronic Signature(s) Signed: 01/25/2021 10:14:24 AM By: Linton Ham MD Entered By: Linton Ham on 01/24/2021 11:19:05 -------------------------------------------------------------------------------- Physician Orders Details Patient Name: Date of Service: [name] [name] F. 01/24/2021 10:30 A M Medical Record Number: [id number] Patient Account Number: [id number] Date of Birth/Sex: Treating RN: January 23, 1946 (74 y.o. 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 - Dr. Dellia Nims Return A Bathing/ Shower/ Hygiene May shower with protection but do not get wound dressing(s) wet. Edema Control - Lymphedema / SCD / Other Elevate legs to the level of the heart or above for 30 minutes daily and/or when sitting, a frequency of: - throughout the day Avoid standing for long periods of time. Off-Loading Other: - multipodus boots to both feet Wound Treatment Wound #25 - Lower Leg Wound Laterality: Right, Lateral Cleanser: Wound Cleanser (Generic) Every Other Day/15 Days Discharge Instructions: Cleanse the wound with wound cleanser prior to applying a clean dressing using gauze sponges, not tissue or cotton  balls. Cleanser: Soap and Water Every Other Day/15 Days Discharge Instructions: May shower and wash wound with dial antibacterial soap and water prior to dressing change. Peri-Wound Care: Triamcinolone 15 (g) Every Other Day/15 Days Discharge Instructions: Use triamcinolone 15 (g) as directed Peri-Wound Care: Sween Lotion (Moisturizing lotion) Every Other Day/15 Days Discharge Instructions: Apply moisturizing lotion as directed Prim Dressing: PolyMem Silver Non-Adhesive Dressing, 4.25x4.25 in (DME) (Generic) Every Other Day/15 Days ary Discharge Instructions: Apply to wound bed as instructed Secondary Dressing: Woven Gauze Sponge, Non-Sterile 4x4 in (Generic) Every Other Day/15 Days Discharge Instructions: Apply over primary dressing as directed. Secondary Dressing: ABD Pad, 5x9 (Generic) Every Other Day/15 Days Discharge Instructions: Apply  [name] [name] ([id number]) Visit Report for 01/24/2021 Debridement Details Patient Name: Date of Service: [name] [name] [facility] F. 01/24/2021 10:30 A M Medical Record Number: [id number] Patient Account Number: [id number] Date of Birth/Sex: Treating RN: [birth year] (75 y.o. [name] [name] Primary Care Provider: [name], [name] Other Clinician: Referring Provider: Treating Provider/Extender: [name] [name] [name] [name] Weeks in Treatment: 249 Debridement Performed for Assessment: Wound #39 Right Calcaneus Performed By: Physician [name] [name]., MD Debridement Type: Debridement Severity of Tissue Pre Debridement: Fat layer exposed Level of Consciousness (Pre-procedure): Awake and Alert Pre-procedure Verification/Time Out Yes - 11:05 Taken: Start Time: 11:05 T Area Debrided (L x W): otal 1.9 (cm) x 3.4 (cm) = 6.46 (cm) Tissue and other material debrided: Viable, Non-Viable, Eschar, Slough, Subcutaneous, Slough Level: Skin/Subcutaneous Tissue Debridement Description: Excisional Instrument: Curette, Forceps, Scissors Bleeding: Minimum Hemostasis Achieved: Pressure End Time: 11:06 Procedural Pain: 0 Post Procedural Pain: 0 Response to Treatment: Procedure was tolerated well Level of Consciousness (Post- Awake and Alert procedure): Post Debridement Measurements of Total Wound Length: (cm) 1.9 Stage: Unstageable/Unclassified Width: (cm) 3.4 Depth: (cm) 0.5 Volume: (cm) 2.537 Character of Wound/Ulcer Post Debridement: Requires Further Debridement Severity of Tissue Post Debridement: Fat layer exposed Post Procedure Diagnosis Same as Pre-procedure Electronic Signature(s) Signed: 01/24/2021 5:32:19 PM By: Deon Pilling RN, BSN Signed: 01/25/2021 10:14:24 AM By: Linton Ham MD Entered By: Linton Ham on 01/24/2021 11:12:22 -------------------------------------------------------------------------------- HPI Details Patient Name: Date of Service: [name] [name] F.  01/24/2021 10:30 A M Medical Record Number: [id number] Patient Account Number: [id number] Date of Birth/Sex: Treating RN: [birth year] (75 y.o. [name] [name] Primary Care Provider: [name], [name] Other Clinician: Referring Provider: Treating Provider/Extender: Yaniah Thiemann [name] [name] Weeks in Treatment: 249 History of Present Illness Location: On the left and right lateral forefoot which has been there for about 6 months Quality: Patient reports No Pain. Severity: Patient states wound(s) are getting worse. Duration: Patient has had the wound for > 6 months prior to seeking treatment at the wound center Context: The wound would happen gradually Modifying Factors: Patient wound(s)/ulcer(s) are worsening due to :continual drainage from the wound ssociated Signs and Symptoms: Patient reports having increase discharge. A HPI Description: This patient returns after being seen here till the end of August and he was lost to follow-up. he has been quite debilitated laying in bed most of the time and his condition has deteriorated significantly. He has multiple ulcerations on the heel lateral forefoot and some of his toes. ======== Old notes: 75 year old male known to our practice when he was seen here in February and March and was lost to follow-up when he was admitted to hospital with various medical problems including coronary artery disease and a stroke. Now returns with the problem on the left forefoot where he has an ulceration and this has been there for about 6 months. most recently he was in hospital between July 6 and July 16, when he was admitted and treated for acute respiratory failure is secondary to aspiration pneumonia, large non-STEMI, ischemic cardiomyopathy with systolic and diastolic congestive heart failure with ejection fraction about 15-20%, ventricular tachycardia and has been treated with amiodarone, acute on careful up at the common new acute CVA, acute chronic kidney  disease stage III, anemia, uncontrolled diabetes mellitus with last hemoglobin A1c being 12%. He has had persistent hyperglycemia given recently. Patient has a past medical history of diabetes mellitus, hypertension, combined systolic and diastolic heart failure, peripheral neuropathy, gout, cardiomyopathy with ejection fraction of about 10-15%,  supposed to have a follow-up with vascular however for some reason this is been delayed till 10/25. 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 He is not complaining of pain. Some of the wounds including In his right dorsal foot actually looks some better but the improvement is minimal Electronic Signature(s) Signed: 01/25/2021 10:14:24 AM By: Linton Ham MD Entered By: Linton Ham on 01/24/2021 11:15:49 -------------------------------------------------------------------------------- Physical Exam Details Patient Name: Date of Service: [name] [name] F. 01/24/2021 10:30 A M Medical Record Number: [id number] Patient Account Number: [id number] Date of Birth/Sex: Treating RN: Dec 24, 1945 (75 y.o. [name] [name] Primary Care Provider: [name], [name] Other Clinician: Referring Provider: Treating Provider/Extender: [name] [name] [name] [name] Weeks in Treatment: 249 Constitutional Sitting or standing Blood Pressure is within target range for patient.. Pulse regular and within target range for patient.Marland Kitchen Respirations regular, non-labored and within target range.. Temperature is normal and within the target range for the patient.Marland Kitchen Appears in no distress. Notes Wound exam; right dorsal foot has a better looking surface. No debridement is required right lateral leg again a reasonably clean surface I think the dimensions here may be slightly smaller. The area on the right  lateral heel has completely necrotic surface. Debrided with a #5 curette then pickups and scissors into subcutaneous tissue. Electronic Signature(s) Signed: 01/25/2021 10:14:24 AM By: Linton Ham MD Entered By: Linton Ham on 01/24/2021 11:19:05 -------------------------------------------------------------------------------- Physician Orders Details Patient Name: Date of Service: [name] [name] F. 01/24/2021 10:30 A M Medical Record Number: [id number] Patient Account Number: [id number] Date of Birth/Sex: Treating RN: January 23, 1946 (74 y.o. 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 - Dr. Dellia Nims Return A Bathing/ Shower/ Hygiene May shower with protection but do not get wound dressing(s) wet. Edema Control - Lymphedema / SCD / Other Elevate legs to the level of the heart or above for 30 minutes daily and/or when sitting, a frequency of: - throughout the day Avoid standing for long periods of time. Off-Loading Other: - multipodus boots to both feet Wound Treatment Wound #25 - Lower Leg Wound Laterality: Right, Lateral Cleanser: Wound Cleanser (Generic) Every Other Day/15 Days Discharge Instructions: Cleanse the wound with wound cleanser prior to applying a clean dressing using gauze sponges, not tissue or cotton  balls. Cleanser: Soap and Water Every Other Day/15 Days Discharge Instructions: May shower and wash wound with dial antibacterial soap and water prior to dressing change. Peri-Wound Care: Triamcinolone 15 (g) Every Other Day/15 Days Discharge Instructions: Use triamcinolone 15 (g) as directed Peri-Wound Care: Sween Lotion (Moisturizing lotion) Every Other Day/15 Days Discharge Instructions: Apply moisturizing lotion as directed Prim Dressing: PolyMem Silver Non-Adhesive Dressing, 4.25x4.25 in (DME) (Generic) Every Other Day/15 Days ary Discharge Instructions: Apply to wound bed as instructed Secondary Dressing: Woven Gauze Sponge, Non-Sterile 4x4 in (Generic) Every Other Day/15 Days Discharge Instructions: Apply over primary dressing as directed. Secondary Dressing: ABD Pad, 5x9 (Generic) Every Other Day/15 Days Discharge Instructions: Apply  supposed to have a follow-up with vascular however for some reason this is been delayed till 10/25. 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 He is not complaining of pain. Some of the wounds including In his right dorsal foot actually looks some better but the improvement is minimal Electronic Signature(s) Signed: 01/25/2021 10:14:24 AM By: Linton Ham MD Entered By: Linton Ham on 01/24/2021 11:15:49 -------------------------------------------------------------------------------- Physical Exam Details Patient Name: Date of Service: [name] [name] F. 01/24/2021 10:30 A M Medical Record Number: [id number] Patient Account Number: [id number] Date of Birth/Sex: Treating RN: Dec 24, 1945 (75 y.o. [name] [name] Primary Care Provider: [name], [name] Other Clinician: Referring Provider: Treating Provider/Extender: [name] [name] [name] [name] Weeks in Treatment: 249 Constitutional Sitting or standing Blood Pressure is within target range for patient.. Pulse regular and within target range for patient.Marland Kitchen Respirations regular, non-labored and within target range.. Temperature is normal and within the target range for the patient.Marland Kitchen Appears in no distress. Notes Wound exam; right dorsal foot has a better looking surface. No debridement is required right lateral leg again a reasonably clean surface I think the dimensions here may be slightly smaller. The area on the right  lateral heel has completely necrotic surface. Debrided with a #5 curette then pickups and scissors into subcutaneous tissue. Electronic Signature(s) Signed: 01/25/2021 10:14:24 AM By: Linton Ham MD Entered By: Linton Ham on 01/24/2021 11:19:05 -------------------------------------------------------------------------------- Physician Orders Details Patient Name: Date of Service: [name] [name] F. 01/24/2021 10:30 A M Medical Record Number: [id number] Patient Account Number: [id number] Date of Birth/Sex: Treating RN: January 23, 1946 (74 y.o. 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 - Dr. Dellia Nims Return A Bathing/ Shower/ Hygiene May shower with protection but do not get wound dressing(s) wet. Edema Control - Lymphedema / SCD / Other Elevate legs to the level of the heart or above for 30 minutes daily and/or when sitting, a frequency of: - throughout the day Avoid standing for long periods of time. Off-Loading Other: - multipodus boots to both feet Wound Treatment Wound #25 - Lower Leg Wound Laterality: Right, Lateral Cleanser: Wound Cleanser (Generic) Every Other Day/15 Days Discharge Instructions: Cleanse the wound with wound cleanser prior to applying a clean dressing using gauze sponges, not tissue or cotton  balls. Cleanser: Soap and Water Every Other Day/15 Days Discharge Instructions: May shower and wash wound with dial antibacterial soap and water prior to dressing change. Peri-Wound Care: Triamcinolone 15 (g) Every Other Day/15 Days Discharge Instructions: Use triamcinolone 15 (g) as directed Peri-Wound Care: Sween Lotion (Moisturizing lotion) Every Other Day/15 Days Discharge Instructions: Apply moisturizing lotion as directed Prim Dressing: PolyMem Silver Non-Adhesive Dressing, 4.25x4.25 in (DME) (Generic) Every Other Day/15 Days ary Discharge Instructions: Apply to wound bed as instructed Secondary Dressing: Woven Gauze Sponge, Non-Sterile 4x4 in (Generic) Every Other Day/15 Days Discharge Instructions: Apply over primary dressing as directed. Secondary Dressing: ABD Pad, 5x9 (Generic) Every Other Day/15 Days Discharge Instructions: Apply  Days Discharge Instructions: Apply Kerlix and Coban compression as directed. Compression Wrap: Coban Self-Adherent Wrap 4x5 (in/yd) (Generic) Every Other Day/15 Days Discharge Instructions: Apply over Kerlix as directed. Wound #42 - Lower Leg Wound Laterality: Right, Anterior, Proximal Cleanser: Wound Cleanser (Generic) Every Other Day/15 Days Discharge Instructions: Cleanse the wound with wound cleanser prior to applying a clean dressing using gauze sponges, not tissue or cotton balls. Cleanser: Soap and Water Every Other Day/15 Days Discharge Instructions: May shower and wash wound with  dial antibacterial soap and water prior to dressing change. Peri-Wound Care: Triamcinolone 15 (g) Every Other Day/15 Days Discharge Instructions: Use triamcinolone 15 (g) as directed Peri-Wound Care: Sween Lotion (Moisturizing lotion) Every Other Day/15 Days Discharge Instructions: Apply moisturizing lotion as directed Prim Dressing: PolyMem Silver Non-Adhesive Dressing, 4.25x4.25 in Every Other Day/15 Days ary Discharge Instructions: Apply to wound bed as instructed Secondary Dressing: Woven Gauze Sponge, Non-Sterile 4x4 in (Generic) Every Other Day/15 Days Discharge Instructions: Apply over primary dressing as directed. Compression Wrap: Kerlix Roll 4.5x3.1 (in/yd) (Generic) Every Other Day/15 Days Discharge Instructions: Apply Kerlix and Coban compression as directed. Compression Wrap: Coban Self-Adherent Wrap 4x5 (in/yd) (Generic) Every Other Day/15 Days Discharge Instructions: Apply over Kerlix as directed. Radiology X-ray, right heel - Non healing wound on right heel - (ICD10 E11.621 - Type 2 diabetes mellitus with foot ulcer) Electronic Signature(s) Signed: 01/24/2021 5:33:33 PM By: Levan Hurst RN, BSN Signed: 01/25/2021 10:14:24 AM By: Linton Ham MD Entered By: Levan Hurst on 01/24/2021 12:04:03 Prescription 01/24/2021 -------------------------------------------------------------------------------- [name] MD Patient Name: Provider: 1945/04/26 [id number] Date of Birth: NPI#: [name] [id number] Sex: DEA #: [phone] [id number] Phone #: License #: [place] Patient Address: [address] [place], [place] [address] [place], [place] [address] [address] Provider's Orders X-ray, right heel - ICD10: E11.621 - Non healing wound on right heel Hand Signature: Date(s): Electronic Signature(s) Signed: 01/24/2021 5:33:33 PM By: Levan Hurst RN, BSN Signed:  01/25/2021 10:14:24 AM By: Linton Ham MD Entered By: Levan Hurst on 01/24/2021 12:04:05 -------------------------------------------------------------------------------- Problem List Details Patient Name: Date of Service: [name] [name] F. 01/24/2021 10:30 A M Medical Record Number: [id number] Patient Account Number: [id number] Date of Birth/Sex: Treating RN: May 18, 1945 (75 y.o. [name] [name] Primary Care Provider: [name], [name] Other Clinician: Referring Provider: Treating Provider/Extender: Terissa Haffey [name] [name] Weeks in Treatment: 249 Active Problems ICD-10 Encounter Code Description Active Date MDM Diagnosis E11.621 Type 2 diabetes mellitus with foot ulcer 04/17/2016 No Yes L97.518 Non-pressure chronic ulcer of other part of right foot with other specified 05/17/2020 No Yes severity L97.218 Non-pressure chronic ulcer of right calf with other specified severity 11/01/2020 No Yes S80.211D Abrasion, right knee, subsequent encounter 11/01/2020 No Yes 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 Inactive Date L89.613 Pressure ulcer of right heel, stage 3 04/17/2016 04/17/2016 L97.221 Non-pressure chronic ulcer of left calf limited to breakdown of skin 08/15/2017 08/15/2017 L97.321 Non-pressure chronic ulcer of left ankle limited to breakdown of skin 07/01/2018 07/01/2018 L03.032 Cellulitis of left toe 01/06/2019 01/06/2019 Y07.371 Pressure ulcer of left heel, stage 3 12/04/2016 12/04/2016 I25.119 Atherosclerotic heart disease of native coronary artery with unspecified angina 04/17/2016 04/17/2016 pectoris L97.821 Non-pressure chronic ulcer of other part of left lower leg limited to breakdown of skin 01/06/2019 01/06/2019 I50.31  [name] [name] ([id number]) Visit Report for 01/24/2021 Debridement Details Patient Name: Date of Service: [name] [name] [facility] F. 01/24/2021 10:30 A M Medical Record Number: [id number] Patient Account Number: [id number] Date of Birth/Sex: Treating RN: [birth year] (75 y.o. [name] [name] Primary Care Provider: [name], [name] Other Clinician: Referring Provider: Treating Provider/Extender: [name] [name] [name] [name] Weeks in Treatment: 249 Debridement Performed for Assessment: Wound #39 Right Calcaneus Performed By: Physician [name] [name]., MD Debridement Type: Debridement Severity of Tissue Pre Debridement: Fat layer exposed Level of Consciousness (Pre-procedure): Awake and Alert Pre-procedure Verification/Time Out Yes - 11:05 Taken: Start Time: 11:05 T Area Debrided (L x W): otal 1.9 (cm) x 3.4 (cm) = 6.46 (cm) Tissue and other material debrided: Viable, Non-Viable, Eschar, Slough, Subcutaneous, Slough Level: Skin/Subcutaneous Tissue Debridement Description: Excisional Instrument: Curette, Forceps, Scissors Bleeding: Minimum Hemostasis Achieved: Pressure End Time: 11:06 Procedural Pain: 0 Post Procedural Pain: 0 Response to Treatment: Procedure was tolerated well Level of Consciousness (Post- Awake and Alert procedure): Post Debridement Measurements of Total Wound Length: (cm) 1.9 Stage: Unstageable/Unclassified Width: (cm) 3.4 Depth: (cm) 0.5 Volume: (cm) 2.537 Character of Wound/Ulcer Post Debridement: Requires Further Debridement Severity of Tissue Post Debridement: Fat layer exposed Post Procedure Diagnosis Same as Pre-procedure Electronic Signature(s) Signed: 01/24/2021 5:32:19 PM By: Deon Pilling RN, BSN Signed: 01/25/2021 10:14:24 AM By: Linton Ham MD Entered By: Linton Ham on 01/24/2021 11:12:22 -------------------------------------------------------------------------------- HPI Details Patient Name: Date of Service: [name] [name] F.  01/24/2021 10:30 A M Medical Record Number: [id number] Patient Account Number: [id number] Date of Birth/Sex: Treating RN: [birth year] (75 y.o. [name] [name] Primary Care Provider: [name], [name] Other Clinician: Referring Provider: Treating Provider/Extender: Yaniah Thiemann [name] [name] Weeks in Treatment: 249 History of Present Illness Location: On the left and right lateral forefoot which has been there for about 6 months Quality: Patient reports No Pain. Severity: Patient states wound(s) are getting worse. Duration: Patient has had the wound for > 6 months prior to seeking treatment at the wound center Context: The wound would happen gradually Modifying Factors: Patient wound(s)/ulcer(s) are worsening due to :continual drainage from the wound ssociated Signs and Symptoms: Patient reports having increase discharge. A HPI Description: This patient returns after being seen here till the end of August and he was lost to follow-up. he has been quite debilitated laying in bed most of the time and his condition has deteriorated significantly. He has multiple ulcerations on the heel lateral forefoot and some of his toes. ======== Old notes: 75 year old male known to our practice when he was seen here in February and March and was lost to follow-up when he was admitted to hospital with various medical problems including coronary artery disease and a stroke. Now returns with the problem on the left forefoot where he has an ulceration and this has been there for about 6 months. most recently he was in hospital between July 6 and July 16, when he was admitted and treated for acute respiratory failure is secondary to aspiration pneumonia, large non-STEMI, ischemic cardiomyopathy with systolic and diastolic congestive heart failure with ejection fraction about 15-20%, ventricular tachycardia and has been treated with amiodarone, acute on careful up at the common new acute CVA, acute chronic kidney  disease stage III, anemia, uncontrolled diabetes mellitus with last hemoglobin A1c being 12%. He has had persistent hyperglycemia given recently. Patient has a past medical history of diabetes mellitus, hypertension, combined systolic and diastolic heart failure, peripheral neuropathy, gout, cardiomyopathy with ejection fraction of about 10-15%,  Days Discharge Instructions: Apply Kerlix and Coban compression as directed. Compression Wrap: Coban Self-Adherent Wrap 4x5 (in/yd) (Generic) Every Other Day/15 Days Discharge Instructions: Apply over Kerlix as directed. Wound #42 - Lower Leg Wound Laterality: Right, Anterior, Proximal Cleanser: Wound Cleanser (Generic) Every Other Day/15 Days Discharge Instructions: Cleanse the wound with wound cleanser prior to applying a clean dressing using gauze sponges, not tissue or cotton balls. Cleanser: Soap and Water Every Other Day/15 Days Discharge Instructions: May shower and wash wound with  dial antibacterial soap and water prior to dressing change. Peri-Wound Care: Triamcinolone 15 (g) Every Other Day/15 Days Discharge Instructions: Use triamcinolone 15 (g) as directed Peri-Wound Care: Sween Lotion (Moisturizing lotion) Every Other Day/15 Days Discharge Instructions: Apply moisturizing lotion as directed Prim Dressing: PolyMem Silver Non-Adhesive Dressing, 4.25x4.25 in Every Other Day/15 Days ary Discharge Instructions: Apply to wound bed as instructed Secondary Dressing: Woven Gauze Sponge, Non-Sterile 4x4 in (Generic) Every Other Day/15 Days Discharge Instructions: Apply over primary dressing as directed. Compression Wrap: Kerlix Roll 4.5x3.1 (in/yd) (Generic) Every Other Day/15 Days Discharge Instructions: Apply Kerlix and Coban compression as directed. Compression Wrap: Coban Self-Adherent Wrap 4x5 (in/yd) (Generic) Every Other Day/15 Days Discharge Instructions: Apply over Kerlix as directed. Radiology X-ray, right heel - Non healing wound on right heel - (ICD10 E11.621 - Type 2 diabetes mellitus with foot ulcer) Electronic Signature(s) Signed: 01/24/2021 5:33:33 PM By: Levan Hurst RN, BSN Signed: 01/25/2021 10:14:24 AM By: Linton Ham MD Entered By: Levan Hurst on 01/24/2021 12:04:03 Prescription 01/24/2021 -------------------------------------------------------------------------------- [name] MD Patient Name: Provider: 1945/04/26 [id number] Date of Birth: NPI#: [name] [id number] Sex: DEA #: [phone] [id number] Phone #: License #: [place] Patient Address: [address] [place], [place] [address] [place], [place] [address] [place] Provider's Orders X-ray, right heel - ICD10: E11.621 - Non healing wound on right heel Hand Signature: Date(s): Electronic Signature(s) Signed: 01/24/2021 5:33:33 PM By: Levan Hurst RN, BSN Signed:  01/25/2021 10:14:24 AM By: Linton Ham MD Entered By: Levan Hurst on 01/24/2021 12:04:05 -------------------------------------------------------------------------------- Problem List Details Patient Name: Date of Service: [name] [name] F. 01/24/2021 10:30 A M Medical Record Number: [id number] Patient Account Number: [id number] Date of Birth/Sex: Treating RN: May 18, 1945 (75 y.o. [name] [name] Primary Care Provider: [name], [name] Other Clinician: Referring Provider: Treating Provider/Extender: Terissa Haffey [name] [name] Weeks in Treatment: 249 Active Problems ICD-10 Encounter Code Description Active Date MDM Diagnosis E11.621 Type 2 diabetes mellitus with foot ulcer 04/17/2016 No Yes L97.518 Non-pressure chronic ulcer of other part of right foot with other specified 05/17/2020 No Yes severity L97.218 Non-pressure chronic ulcer of right calf with other specified severity 11/01/2020 No Yes S80.211D Abrasion, right knee, subsequent encounter 11/01/2020 No Yes 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 Inactive Date L89.613 Pressure ulcer of right heel, stage 3 04/17/2016 04/17/2016 L97.221 Non-pressure chronic ulcer of left calf limited to breakdown of skin 08/15/2017 08/15/2017 L97.321 Non-pressure chronic ulcer of left ankle limited to breakdown of skin 07/01/2018 07/01/2018 L03.032 Cellulitis of left toe 01/06/2019 01/06/2019 Y07.371 Pressure ulcer of left heel, stage 3 12/04/2016 12/04/2016 I25.119 Atherosclerotic heart disease of native coronary artery with unspecified angina 04/17/2016 04/17/2016 pectoris L97.821 Non-pressure chronic ulcer of other part of left lower leg limited to breakdown of skin 01/06/2019 01/06/2019 I50.31  [name] [name] ([id number]) Visit Report for 01/24/2021 Debridement Details Patient Name: Date of Service: [name] [name] [facility] F. 01/24/2021 10:30 A M Medical Record Number: [id number] Patient Account Number: [id number] Date of Birth/Sex: Treating RN: [birth year] (75 y.o. [name] [name] Primary Care Provider: [name], [name] Other Clinician: Referring Provider: Treating Provider/Extender: [name] [name] [name] [name] Weeks in Treatment: 249 Debridement Performed for Assessment: Wound #39 Right Calcaneus Performed By: Physician [name] [name]., MD Debridement Type: Debridement Severity of Tissue Pre Debridement: Fat layer exposed Level of Consciousness (Pre-procedure): Awake and Alert Pre-procedure Verification/Time Out Yes - 11:05 Taken: Start Time: 11:05 T Area Debrided (L x W): otal 1.9 (cm) x 3.4 (cm) = 6.46 (cm) Tissue and other material debrided: Viable, Non-Viable, Eschar, Slough, Subcutaneous, Slough Level: Skin/Subcutaneous Tissue Debridement Description: Excisional Instrument: Curette, Forceps, Scissors Bleeding: Minimum Hemostasis Achieved: Pressure End Time: 11:06 Procedural Pain: 0 Post Procedural Pain: 0 Response to Treatment: Procedure was tolerated well Level of Consciousness (Post- Awake and Alert procedure): Post Debridement Measurements of Total Wound Length: (cm) 1.9 Stage: Unstageable/Unclassified Width: (cm) 3.4 Depth: (cm) 0.5 Volume: (cm) 2.537 Character of Wound/Ulcer Post Debridement: Requires Further Debridement Severity of Tissue Post Debridement: Fat layer exposed Post Procedure Diagnosis Same as Pre-procedure Electronic Signature(s) Signed: 01/24/2021 5:32:19 PM By: Deon Pilling RN, BSN Signed: 01/25/2021 10:14:24 AM By: Linton Ham MD Entered By: Linton Ham on 01/24/2021 11:12:22 -------------------------------------------------------------------------------- HPI Details Patient Name: Date of Service: [name] [name] F.  01/24/2021 10:30 A M Medical Record Number: [id number] Patient Account Number: [id number] Date of Birth/Sex: Treating RN: [birth year] (75 y.o. [name] [name] Primary Care Provider: [name], [name] Other Clinician: Referring Provider: Treating Provider/Extender: Yaniah Thiemann [name] [name] Weeks in Treatment: 249 History of Present Illness Location: On the left and right lateral forefoot which has been there for about 6 months Quality: Patient reports No Pain. Severity: Patient states wound(s) are getting worse. Duration: Patient has had the wound for > 6 months prior to seeking treatment at the wound center Context: The wound would happen gradually Modifying Factors: Patient wound(s)/ulcer(s) are worsening due to :continual drainage from the wound ssociated Signs and Symptoms: Patient reports having increase discharge. A HPI Description: This patient returns after being seen here till the end of August and he was lost to follow-up. he has been quite debilitated laying in bed most of the time and his condition has deteriorated significantly. He has multiple ulcerations on the heel lateral forefoot and some of his toes. ======== Old notes: 75 year old male known to our practice when he was seen here in February and March and was lost to follow-up when he was admitted to hospital with various medical problems including coronary artery disease and a stroke. Now returns with the problem on the left forefoot where he has an ulceration and this has been there for about 6 months. most recently he was in hospital between July 6 and July 16, when he was admitted and treated for acute respiratory failure is secondary to aspiration pneumonia, large non-STEMI, ischemic cardiomyopathy with systolic and diastolic congestive heart failure with ejection fraction about 15-20%, ventricular tachycardia and has been treated with amiodarone, acute on careful up at the common new acute CVA, acute chronic kidney  disease stage III, anemia, uncontrolled diabetes mellitus with last hemoglobin A1c being 12%. He has had persistent hyperglycemia given recently. Patient has a past medical history of diabetes mellitus, hypertension, combined systolic and diastolic heart failure, peripheral neuropathy, gout, cardiomyopathy with ejection fraction of about 10-15%,  supposed to have a follow-up with vascular however for some reason this is been delayed till 10/25. 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 He is not complaining of pain. Some of the wounds including In his right dorsal foot actually looks some better but the improvement is minimal Electronic Signature(s) Signed: 01/25/2021 10:14:24 AM By: Linton Ham MD Entered By: Linton Ham on 01/24/2021 11:15:49 -------------------------------------------------------------------------------- Physical Exam Details Patient Name: Date of Service: [name] [name] F. 01/24/2021 10:30 A M Medical Record Number: [id number] Patient Account Number: [id number] Date of Birth/Sex: Treating RN: Dec 24, 1945 (75 y.o. [name] [name] Primary Care Provider: [name], [name] Other Clinician: Referring Provider: Treating Provider/Extender: [name] [name] [name] [name] Weeks in Treatment: 249 Constitutional Sitting or standing Blood Pressure is within target range for patient.. Pulse regular and within target range for patient.Marland Kitchen Respirations regular, non-labored and within target range.. Temperature is normal and within the target range for the patient.Marland Kitchen Appears in no distress. Notes Wound exam; right dorsal foot has a better looking surface. No debridement is required right lateral leg again a reasonably clean surface I think the dimensions here may be slightly smaller. The area on the right  lateral heel has completely necrotic surface. Debrided with a #5 curette then pickups and scissors into subcutaneous tissue. Electronic Signature(s) Signed: 01/25/2021 10:14:24 AM By: Linton Ham MD Entered By: Linton Ham on 01/24/2021 11:19:05 -------------------------------------------------------------------------------- Physician Orders Details Patient Name: Date of Service: [name] [name] F. 01/24/2021 10:30 A M Medical Record Number: [id number] Patient Account Number: [id number] Date of Birth/Sex: Treating RN: January 23, 1946 (74 y.o. 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 - Dr. Dellia Nims Return A Bathing/ Shower/ Hygiene May shower with protection but do not get wound dressing(s) wet. Edema Control - Lymphedema / SCD / Other Elevate legs to the level of the heart or above for 30 minutes daily and/or when sitting, a frequency of: - throughout the day Avoid standing for long periods of time. Off-Loading Other: - multipodus boots to both feet Wound Treatment Wound #25 - Lower Leg Wound Laterality: Right, Lateral Cleanser: Wound Cleanser (Generic) Every Other Day/15 Days Discharge Instructions: Cleanse the wound with wound cleanser prior to applying a clean dressing using gauze sponges, not tissue or cotton  balls. Cleanser: Soap and Water Every Other Day/15 Days Discharge Instructions: May shower and wash wound with dial antibacterial soap and water prior to dressing change. Peri-Wound Care: Triamcinolone 15 (g) Every Other Day/15 Days Discharge Instructions: Use triamcinolone 15 (g) as directed Peri-Wound Care: Sween Lotion (Moisturizing lotion) Every Other Day/15 Days Discharge Instructions: Apply moisturizing lotion as directed Prim Dressing: PolyMem Silver Non-Adhesive Dressing, 4.25x4.25 in (DME) (Generic) Every Other Day/15 Days ary Discharge Instructions: Apply to wound bed as instructed Secondary Dressing: Woven Gauze Sponge, Non-Sterile 4x4 in (Generic) Every Other Day/15 Days Discharge Instructions: Apply over primary dressing as directed. Secondary Dressing: ABD Pad, 5x9 (Generic) Every Other Day/15 Days Discharge Instructions: Apply  supposed to have a follow-up with vascular however for some reason this is been delayed till 10/25. 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 He is not complaining of pain. Some of the wounds including In his right dorsal foot actually looks some better but the improvement is minimal Electronic Signature(s) Signed: 01/25/2021 10:14:24 AM By: Linton Ham MD Entered By: Linton Ham on 01/24/2021 11:15:49 -------------------------------------------------------------------------------- Physical Exam Details Patient Name: Date of Service: [name] [name] F. 01/24/2021 10:30 A M Medical Record Number: [id number] Patient Account Number: [id number] Date of Birth/Sex: Treating RN: Dec 24, 1945 (75 y.o. [name] [name] Primary Care Provider: [name], [name] Other Clinician: Referring Provider: Treating Provider/Extender: [name] [name] [name] [name] Weeks in Treatment: 249 Constitutional Sitting or standing Blood Pressure is within target range for patient.. Pulse regular and within target range for patient.Marland Kitchen Respirations regular, non-labored and within target range.. Temperature is normal and within the target range for the patient.Marland Kitchen Appears in no distress. Notes Wound exam; right dorsal foot has a better looking surface. No debridement is required right lateral leg again a reasonably clean surface I think the dimensions here may be slightly smaller. The area on the right  lateral heel has completely necrotic surface. Debrided with a #5 curette then pickups and scissors into subcutaneous tissue. Electronic Signature(s) Signed: 01/25/2021 10:14:24 AM By: Linton Ham MD Entered By: Linton Ham on 01/24/2021 11:19:05 -------------------------------------------------------------------------------- Physician Orders Details Patient Name: Date of Service: [name] [name] F. 01/24/2021 10:30 A M Medical Record Number: [id number] Patient Account Number: [id number] Date of Birth/Sex: Treating RN: January 23, 1946 (74 y.o. 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 - Dr. Dellia Nims Return A Bathing/ Shower/ Hygiene May shower with protection but do not get wound dressing(s) wet. Edema Control - Lymphedema / SCD / Other Elevate legs to the level of the heart or above for 30 minutes daily and/or when sitting, a frequency of: - throughout the day Avoid standing for long periods of time. Off-Loading Other: - multipodus boots to both feet Wound Treatment Wound #25 - Lower Leg Wound Laterality: Right, Lateral Cleanser: Wound Cleanser (Generic) Every Other Day/15 Days Discharge Instructions: Cleanse the wound with wound cleanser prior to applying a clean dressing using gauze sponges, not tissue or cotton  balls. Cleanser: Soap and Water Every Other Day/15 Days Discharge Instructions: May shower and wash wound with dial antibacterial soap and water prior to dressing change. Peri-Wound Care: Triamcinolone 15 (g) Every Other Day/15 Days Discharge Instructions: Use triamcinolone 15 (g) as directed Peri-Wound Care: Sween Lotion (Moisturizing lotion) Every Other Day/15 Days Discharge Instructions: Apply moisturizing lotion as directed Prim Dressing: PolyMem Silver Non-Adhesive Dressing, 4.25x4.25 in (DME) (Generic) Every Other Day/15 Days ary Discharge Instructions: Apply to wound bed as instructed Secondary Dressing: Woven Gauze Sponge, Non-Sterile 4x4 in (Generic) Every Other Day/15 Days Discharge Instructions: Apply over primary dressing as directed. Secondary Dressing: ABD Pad, 5x9 (Generic) Every Other Day/15 Days Discharge Instructions: Apply  Days Discharge Instructions: Apply Kerlix and Coban compression as directed. Compression Wrap: Coban Self-Adherent Wrap 4x5 (in/yd) (Generic) Every Other Day/15 Days Discharge Instructions: Apply over Kerlix as directed. Wound #42 - Lower Leg Wound Laterality: Right, Anterior, Proximal Cleanser: Wound Cleanser (Generic) Every Other Day/15 Days Discharge Instructions: Cleanse the wound with wound cleanser prior to applying a clean dressing using gauze sponges, not tissue or cotton balls. Cleanser: Soap and Water Every Other Day/15 Days Discharge Instructions: May shower and wash wound with  dial antibacterial soap and water prior to dressing change. Peri-Wound Care: Triamcinolone 15 (g) Every Other Day/15 Days Discharge Instructions: Use triamcinolone 15 (g) as directed Peri-Wound Care: Sween Lotion (Moisturizing lotion) Every Other Day/15 Days Discharge Instructions: Apply moisturizing lotion as directed Prim Dressing: PolyMem Silver Non-Adhesive Dressing, 4.25x4.25 in Every Other Day/15 Days ary Discharge Instructions: Apply to wound bed as instructed Secondary Dressing: Woven Gauze Sponge, Non-Sterile 4x4 in (Generic) Every Other Day/15 Days Discharge Instructions: Apply over primary dressing as directed. Compression Wrap: Kerlix Roll 4.5x3.1 (in/yd) (Generic) Every Other Day/15 Days Discharge Instructions: Apply Kerlix and Coban compression as directed. Compression Wrap: Coban Self-Adherent Wrap 4x5 (in/yd) (Generic) Every Other Day/15 Days Discharge Instructions: Apply over Kerlix as directed. Radiology X-ray, right heel - Non healing wound on right heel - (ICD10 E11.621 - Type 2 diabetes mellitus with foot ulcer) Electronic Signature(s) Signed: 01/24/2021 5:33:33 PM By: Levan Hurst RN, BSN Signed: 01/25/2021 10:14:24 AM By: Linton Ham MD Entered By: Levan Hurst on 01/24/2021 12:04:03 Prescription 01/24/2021 -------------------------------------------------------------------------------- [name] MD Patient Name: Provider: 1945/04/26 [id number] Date of Birth: NPI#: [name] [id number] Sex: DEA #: [phone] [id number] Phone #: License #: [place] Patient Address: [address], [place] [address] [place], [place] [address] [place] Provider's Orders X-ray, right heel - ICD10: E11.621 - Non healing wound on right heel Hand Signature: Date(s): Electronic Signature(s) Signed: 01/24/2021 5:33:33 PM By: Levan Hurst RN, BSN Signed:  01/25/2021 10:14:24 AM By: Linton Ham MD Entered By: Levan Hurst on 01/24/2021 12:04:05 -------------------------------------------------------------------------------- Problem List Details Patient Name: Date of Service: [name] [name] F. 01/24/2021 10:30 A M Medical Record Number: [id number] Patient Account Number: [id number] Date of Birth/Sex: Treating RN: May 18, 1945 (75 y.o. [name] [name] Primary Care Provider: [name], [name] Other Clinician: Referring Provider: Treating Provider/Extender: Terissa Haffey [name] [name] Weeks in Treatment: 249 Active Problems ICD-10 Encounter Code Description Active Date MDM Diagnosis E11.621 Type 2 diabetes mellitus with foot ulcer 04/17/2016 No Yes L97.518 Non-pressure chronic ulcer of other part of right foot with other specified 05/17/2020 No Yes severity L97.218 Non-pressure chronic ulcer of right calf with other specified severity 11/01/2020 No Yes S80.211D Abrasion, right knee, subsequent encounter 11/01/2020 No Yes 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 Inactive Date L89.613 Pressure ulcer of right heel, stage 3 04/17/2016 04/17/2016 L97.221 Non-pressure chronic ulcer of left calf limited to breakdown of skin 08/15/2017 08/15/2017 L97.321 Non-pressure chronic ulcer of left ankle limited to breakdown of skin 07/01/2018 07/01/2018 L03.032 Cellulitis of left toe 01/06/2019 01/06/2019 Y07.371 Pressure ulcer of left heel, stage 3 12/04/2016 12/04/2016 I25.119 Atherosclerotic heart disease of native coronary artery with unspecified angina 04/17/2016 04/17/2016 pectoris L97.821 Non-pressure chronic ulcer of other part of left lower leg limited to breakdown of skin 01/06/2019 01/06/2019 I50.31  supposed to have a follow-up with vascular however for some reason this is been delayed till 10/25. 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 He is not complaining of pain. Some of the wounds including In his right dorsal foot actually looks some better but the improvement is minimal Electronic Signature(s) Signed: 01/25/2021 10:14:24 AM By: Linton Ham MD Entered By: Linton Ham on 01/24/2021 11:15:49 -------------------------------------------------------------------------------- Physical Exam Details Patient Name: Date of Service: [name] [name] F. 01/24/2021 10:30 A M Medical Record Number: [id number] Patient Account Number: [id number] Date of Birth/Sex: Treating RN: Dec 24, 1945 (75 y.o. [name] [name] Primary Care Provider: [name], [name] Other Clinician: Referring Provider: Treating Provider/Extender: [name] [name] [name] [name] Weeks in Treatment: 249 Constitutional Sitting or standing Blood Pressure is within target range for patient.. Pulse regular and within target range for patient.Marland Kitchen Respirations regular, non-labored and within target range.. Temperature is normal and within the target range for the patient.Marland Kitchen Appears in no distress. Notes Wound exam; right dorsal foot has a better looking surface. No debridement is required right lateral leg again a reasonably clean surface I think the dimensions here may be slightly smaller. The area on the right  lateral heel has completely necrotic surface. Debrided with a #5 curette then pickups and scissors into subcutaneous tissue. Electronic Signature(s) Signed: 01/25/2021 10:14:24 AM By: Linton Ham MD Entered By: Linton Ham on 01/24/2021 11:19:05 -------------------------------------------------------------------------------- Physician Orders Details Patient Name: Date of Service: [name] [name] F. 01/24/2021 10:30 A M Medical Record Number: [id number] Patient Account Number: [id number] Date of Birth/Sex: Treating RN: January 23, 1946 (74 y.o. 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 - Dr. Dellia Nims Return A Bathing/ Shower/ Hygiene May shower with protection but do not get wound dressing(s) wet. Edema Control - Lymphedema / SCD / Other Elevate legs to the level of the heart or above for 30 minutes daily and/or when sitting, a frequency of: - throughout the day Avoid standing for long periods of time. Off-Loading Other: - multipodus boots to both feet Wound Treatment Wound #25 - Lower Leg Wound Laterality: Right, Lateral Cleanser: Wound Cleanser (Generic) Every Other Day/15 Days Discharge Instructions: Cleanse the wound with wound cleanser prior to applying a clean dressing using gauze sponges, not tissue or cotton  balls. Cleanser: Soap and Water Every Other Day/15 Days Discharge Instructions: May shower and wash wound with dial antibacterial soap and water prior to dressing change. Peri-Wound Care: Triamcinolone 15 (g) Every Other Day/15 Days Discharge Instructions: Use triamcinolone 15 (g) as directed Peri-Wound Care: Sween Lotion (Moisturizing lotion) Every Other Day/15 Days Discharge Instructions: Apply moisturizing lotion as directed Prim Dressing: PolyMem Silver Non-Adhesive Dressing, 4.25x4.25 in (DME) (Generic) Every Other Day/15 Days ary Discharge Instructions: Apply to wound bed as instructed Secondary Dressing: Woven Gauze Sponge, Non-Sterile 4x4 in (Generic) Every Other Day/15 Days Discharge Instructions: Apply over primary dressing as directed. Secondary Dressing: ABD Pad, 5x9 (Generic) Every Other Day/15 Days Discharge Instructions: Apply  supposed to have a follow-up with vascular however for some reason this is been delayed till 10/25. 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 He is not complaining of pain. Some of the wounds including In his right dorsal foot actually looks some better but the improvement is minimal Electronic Signature(s) Signed: 01/25/2021 10:14:24 AM By: Linton Ham MD Entered By: Linton Ham on 01/24/2021 11:15:49 -------------------------------------------------------------------------------- Physical Exam Details Patient Name: Date of Service: [name] [name] F. 01/24/2021 10:30 A M Medical Record Number: [id number] Patient Account Number: [id number] Date of Birth/Sex: Treating RN: Dec 24, 1945 (75 y.o. [name] [name] Primary Care Provider: [name], [name] Other Clinician: Referring Provider: Treating Provider/Extender: [name] [name] [name] [name] Weeks in Treatment: 249 Constitutional Sitting or standing Blood Pressure is within target range for patient.. Pulse regular and within target range for patient.Marland Kitchen Respirations regular, non-labored and within target range.. Temperature is normal and within the target range for the patient.Marland Kitchen Appears in no distress. Notes Wound exam; right dorsal foot has a better looking surface. No debridement is required right lateral leg again a reasonably clean surface I think the dimensions here may be slightly smaller. The area on the right  lateral heel has completely necrotic surface. Debrided with a #5 curette then pickups and scissors into subcutaneous tissue. Electronic Signature(s) Signed: 01/25/2021 10:14:24 AM By: Linton Ham MD Entered By: Linton Ham on 01/24/2021 11:19:05 -------------------------------------------------------------------------------- Physician Orders Details Patient Name: Date of Service: [name] [name] F. 01/24/2021 10:30 A M Medical Record Number: [id number] Patient Account Number: [id number] Date of Birth/Sex: Treating RN: January 23, 1946 (74 y.o. 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 - Dr. Dellia Nims Return A Bathing/ Shower/ Hygiene May shower with protection but do not get wound dressing(s) wet. Edema Control - Lymphedema / SCD / Other Elevate legs to the level of the heart or above for 30 minutes daily and/or when sitting, a frequency of: - throughout the day Avoid standing for long periods of time. Off-Loading Other: - multipodus boots to both feet Wound Treatment Wound #25 - Lower Leg Wound Laterality: Right, Lateral Cleanser: Wound Cleanser (Generic) Every Other Day/15 Days Discharge Instructions: Cleanse the wound with wound cleanser prior to applying a clean dressing using gauze sponges, not tissue or cotton  balls. Cleanser: Soap and Water Every Other Day/15 Days Discharge Instructions: May shower and wash wound with dial antibacterial soap and water prior to dressing change. Peri-Wound Care: Triamcinolone 15 (g) Every Other Day/15 Days Discharge Instructions: Use triamcinolone 15 (g) as directed Peri-Wound Care: Sween Lotion (Moisturizing lotion) Every Other Day/15 Days Discharge Instructions: Apply moisturizing lotion as directed Prim Dressing: PolyMem Silver Non-Adhesive Dressing, 4.25x4.25 in (DME) (Generic) Every Other Day/15 Days ary Discharge Instructions: Apply to wound bed as instructed Secondary Dressing: Woven Gauze Sponge, Non-Sterile 4x4 in (Generic) Every Other Day/15 Days Discharge Instructions: Apply over primary dressing as directed. Secondary Dressing: ABD Pad, 5x9 (Generic) Every Other Day/15 Days Discharge Instructions: Apply  Days Discharge Instructions: Apply Kerlix and Coban compression as directed. Compression Wrap: Coban Self-Adherent Wrap 4x5 (in/yd) (Generic) Every Other Day/15 Days Discharge Instructions: Apply over Kerlix as directed. Wound #42 - Lower Leg Wound Laterality: Right, Anterior, Proximal Cleanser: Wound Cleanser (Generic) Every Other Day/15 Days Discharge Instructions: Cleanse the wound with wound cleanser prior to applying a clean dressing using gauze sponges, not tissue or cotton balls. Cleanser: Soap and Water Every Other Day/15 Days Discharge Instructions: May shower and wash wound with  dial antibacterial soap and water prior to dressing change. Peri-Wound Care: Triamcinolone 15 (g) Every Other Day/15 Days Discharge Instructions: Use triamcinolone 15 (g) as directed Peri-Wound Care: Sween Lotion (Moisturizing lotion) Every Other Day/15 Days Discharge Instructions: Apply moisturizing lotion as directed Prim Dressing: PolyMem Silver Non-Adhesive Dressing, 4.25x4.25 in Every Other Day/15 Days ary Discharge Instructions: Apply to wound bed as instructed Secondary Dressing: Woven Gauze Sponge, Non-Sterile 4x4 in (Generic) Every Other Day/15 Days Discharge Instructions: Apply over primary dressing as directed. Compression Wrap: Kerlix Roll 4.5x3.1 (in/yd) (Generic) Every Other Day/15 Days Discharge Instructions: Apply Kerlix and Coban compression as directed. Compression Wrap: Coban Self-Adherent Wrap 4x5 (in/yd) (Generic) Every Other Day/15 Days Discharge Instructions: Apply over Kerlix as directed. Radiology X-ray, right heel - Non healing wound on right heel - (ICD10 E11.621 - Type 2 diabetes mellitus with foot ulcer) Electronic Signature(s) Signed: 01/24/2021 5:33:33 PM By: Levan Hurst RN, BSN Signed: 01/25/2021 10:14:24 AM By: Linton Ham MD Entered By: Levan Hurst on 01/24/2021 12:04:03 Prescription 01/24/2021 -------------------------------------------------------------------------------- [name] MD Patient Name: Provider: 1945/04/26 [id number] Date of Birth: NPI#: [name] [id number] Sex: DEA #: [phone] [id number] Phone #: License #: [place] Patient Address: [address] [place], [place] [address] [place], [place] [address] [place] Provider's Orders X-ray, right heel - ICD10: E11.621 - Non healing wound on right heel Hand Signature: Date(s): Electronic Signature(s) Signed: 01/24/2021 5:33:33 PM By: Levan Hurst RN, BSN Signed:  01/25/2021 10:14:24 AM By: Linton Ham MD Entered By: Levan Hurst on 01/24/2021 12:04:05 -------------------------------------------------------------------------------- Problem List Details Patient Name: Date of Service: [name] [name] F. 01/24/2021 10:30 A M Medical Record Number: [id number] Patient Account Number: [id number] Date of Birth/Sex: Treating RN: May 18, 1945 (75 y.o. [name] [name] Primary Care Provider: [name], [name] Other Clinician: Referring Provider: Treating Provider/Extender: Terissa Haffey [name] [name] Weeks in Treatment: 249 Active Problems ICD-10 Encounter Code Description Active Date MDM Diagnosis E11.621 Type 2 diabetes mellitus with foot ulcer 04/17/2016 No Yes L97.518 Non-pressure chronic ulcer of other part of right foot with other specified 05/17/2020 No Yes severity L97.218 Non-pressure chronic ulcer of right calf with other specified severity 11/01/2020 No Yes S80.211D Abrasion, right knee, subsequent encounter 11/01/2020 No Yes 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 Inactive Date L89.613 Pressure ulcer of right heel, stage 3 04/17/2016 04/17/2016 L97.221 Non-pressure chronic ulcer of left calf limited to breakdown of skin 08/15/2017 08/15/2017 L97.321 Non-pressure chronic ulcer of left ankle limited to breakdown of skin 07/01/2018 07/01/2018 L03.032 Cellulitis of left toe 01/06/2019 01/06/2019 Y07.371 Pressure ulcer of left heel, stage 3 12/04/2016 12/04/2016 I25.119 Atherosclerotic heart disease of native coronary artery with unspecified angina 04/17/2016 04/17/2016 pectoris L97.821 Non-pressure chronic ulcer of other part of left lower leg limited to breakdown of skin 01/06/2019 01/06/2019 I50.31  Days Discharge Instructions: Apply Kerlix and Coban compression as directed. Compression Wrap: Coban Self-Adherent Wrap 4x5 (in/yd) (Generic) Every Other Day/15 Days Discharge Instructions: Apply over Kerlix as directed. Wound #42 - Lower Leg Wound Laterality: Right, Anterior, Proximal Cleanser: Wound Cleanser (Generic) Every Other Day/15 Days Discharge Instructions: Cleanse the wound with wound cleanser prior to applying a clean dressing using gauze sponges, not tissue or cotton balls. Cleanser: Soap and Water Every Other Day/15 Days Discharge Instructions: May shower and wash wound with  dial antibacterial soap and water prior to dressing change. Peri-Wound Care: Triamcinolone 15 (g) Every Other Day/15 Days Discharge Instructions: Use triamcinolone 15 (g) as directed Peri-Wound Care: Sween Lotion (Moisturizing lotion) Every Other Day/15 Days Discharge Instructions: Apply moisturizing lotion as directed Prim Dressing: PolyMem Silver Non-Adhesive Dressing, 4.25x4.25 in Every Other Day/15 Days ary Discharge Instructions: Apply to wound bed as instructed Secondary Dressing: Woven Gauze Sponge, Non-Sterile 4x4 in (Generic) Every Other Day/15 Days Discharge Instructions: Apply over primary dressing as directed. Compression Wrap: Kerlix Roll 4.5x3.1 (in/yd) (Generic) Every Other Day/15 Days Discharge Instructions: Apply Kerlix and Coban compression as directed. Compression Wrap: Coban Self-Adherent Wrap 4x5 (in/yd) (Generic) Every Other Day/15 Days Discharge Instructions: Apply over Kerlix as directed. Radiology X-ray, right heel - Non healing wound on right heel - (ICD10 E11.621 - Type 2 diabetes mellitus with foot ulcer) Electronic Signature(s) Signed: 01/24/2021 5:33:33 PM By: Levan Hurst RN, BSN Signed: 01/25/2021 10:14:24 AM By: Linton Ham MD Entered By: Levan Hurst on 01/24/2021 12:04:03 Prescription 01/24/2021 -------------------------------------------------------------------------------- [name] MD Patient Name: Provider: 1945/04/26 [id number] Date of Birth: NPI#: [name] [id number] Sex: DEA #: [phone] Phone #: License #: [place] Patient Address: [address] [place], [place] [address] [place], [place] [address] [place] Provider's Orders X-ray, right heel - ICD10: E11.621 - Non healing wound on right heel Hand Signature: Date(s): Electronic Signature(s) Signed: 01/24/2021 5:33:33 PM By: Levan Hurst RN, BSN Signed:  01/25/2021 10:14:24 AM By: Linton Ham MD Entered By: Levan Hurst on 01/24/2021 12:04:05 -------------------------------------------------------------------------------- Problem List Details Patient Name: Date of Service: [name] [name] F. 01/24/2021 10:30 A M Medical Record Number: [id number] Patient Account Number: [id number] Date of Birth/Sex: Treating RN: May 18, 1945 (75 y.o. [name] [name] Primary Care Provider: [name], [name] Other Clinician: Referring Provider: Treating Provider/Extender: Terissa Haffey [name] [name] Weeks in Treatment: 249 Active Problems ICD-10 Encounter Code Description Active Date MDM Diagnosis E11.621 Type 2 diabetes mellitus with foot ulcer 04/17/2016 No Yes L97.518 Non-pressure chronic ulcer of other part of right foot with other specified 05/17/2020 No Yes severity L97.218 Non-pressure chronic ulcer of right calf with other specified severity 11/01/2020 No Yes S80.211D Abrasion, right knee, subsequent encounter 11/01/2020 No Yes 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 Inactive Date L89.613 Pressure ulcer of right heel, stage 3 04/17/2016 04/17/2016 L97.221 Non-pressure chronic ulcer of left calf limited to breakdown of skin 08/15/2017 08/15/2017 L97.321 Non-pressure chronic ulcer of left ankle limited to breakdown of skin 07/01/2018 07/01/2018 L03.032 Cellulitis of left toe 01/06/2019 01/06/2019 Y07.371 Pressure ulcer of left heel, stage 3 12/04/2016 12/04/2016 I25.119 Atherosclerotic heart disease of native coronary artery with unspecified angina 04/17/2016 04/17/2016 pectoris L97.821 Non-pressure chronic ulcer of other part of left lower leg limited to breakdown of skin 01/06/2019 01/06/2019 I50.31  supposed to have a follow-up with vascular however for some reason this is been delayed till 10/25. 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 He is not complaining of pain. Some of the wounds including In his right dorsal foot actually looks some better but the improvement is minimal Electronic Signature(s) Signed: 01/25/2021 10:14:24 AM By: Linton Ham MD Entered By: Linton Ham on 01/24/2021 11:15:49 -------------------------------------------------------------------------------- Physical Exam Details Patient Name: Date of Service: [name] [name] F. 01/24/2021 10:30 A M Medical Record Number: [id number] Patient Account Number: [id number] Date of Birth/Sex: Treating RN: Dec 24, 1945 (75 y.o. [name] [name] Primary Care Provider: [name], [name] Other Clinician: Referring Provider: Treating Provider/Extender: [name] [name] [name] [name] Weeks in Treatment: 249 Constitutional Sitting or standing Blood Pressure is within target range for patient.. Pulse regular and within target range for patient.Marland Kitchen Respirations regular, non-labored and within target range.. Temperature is normal and within the target range for the patient.Marland Kitchen Appears in no distress. Notes Wound exam; right dorsal foot has a better looking surface. No debridement is required right lateral leg again a reasonably clean surface I think the dimensions here may be slightly smaller. The area on the right  lateral heel has completely necrotic surface. Debrided with a #5 curette then pickups and scissors into subcutaneous tissue. Electronic Signature(s) Signed: 01/25/2021 10:14:24 AM By: Linton Ham MD Entered By: Linton Ham on 01/24/2021 11:19:05 -------------------------------------------------------------------------------- Physician Orders Details Patient Name: Date of Service: [name] [name] F. 01/24/2021 10:30 A M Medical Record Number: [id number] Patient Account Number: [id number] Date of Birth/Sex: Treating RN: January 23, 1946 (74 y.o. 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 - Dr. Dellia Nims Return A Bathing/ Shower/ Hygiene May shower with protection but do not get wound dressing(s) wet. Edema Control - Lymphedema / SCD / Other Elevate legs to the level of the heart or above for 30 minutes daily and/or when sitting, a frequency of: - throughout the day Avoid standing for long periods of time. Off-Loading Other: - multipodus boots to both feet Wound Treatment Wound #25 - Lower Leg Wound Laterality: Right, Lateral Cleanser: Wound Cleanser (Generic) Every Other Day/15 Days Discharge Instructions: Cleanse the wound with wound cleanser prior to applying a clean dressing using gauze sponges, not tissue or cotton  balls. Cleanser: Soap and Water Every Other Day/15 Days Discharge Instructions: May shower and wash wound with dial antibacterial soap and water prior to dressing change. Peri-Wound Care: Triamcinolone 15 (g) Every Other Day/15 Days Discharge Instructions: Use triamcinolone 15 (g) as directed Peri-Wound Care: Sween Lotion (Moisturizing lotion) Every Other Day/15 Days Discharge Instructions: Apply moisturizing lotion as directed Prim Dressing: PolyMem Silver Non-Adhesive Dressing, 4.25x4.25 in (DME) (Generic) Every Other Day/15 Days ary Discharge Instructions: Apply to wound bed as instructed Secondary Dressing: Woven Gauze Sponge, Non-Sterile 4x4 in (Generic) Every Other Day/15 Days Discharge Instructions: Apply over primary dressing as directed. Secondary Dressing: ABD Pad, 5x9 (Generic) Every Other Day/15 Days Discharge Instructions: Apply  wound bed as instructed Secondary Dressing: Woven Gauze Sponge, Non-Sterile 4x4 in [facility]) (Generic) Every Other Day/15 Days Discharge Instructions: Apply over primary dressing as directed. Com pression Wrap: Kerlix Roll 4.5x3.1 (in/yd) (Home Health) (Generic) Every Other Day/15 Days Discharge Instructions: Apply Kerlix and Coban compression as directed. Com pression Wrap: Coban Self-Adherent Wrap 4x5 (in/yd) (Home Health) (Generic) Every Other Day/15 Days Discharge Instructions: Apply over Kerlix as directed. WOUND #42: - Lower Leg Wound Laterality: Right, Anterior, Proximal Cleanser: Wound Cleanser (Home Health) (Generic) Every Other Day/15 Days Discharge Instructions: Cleanse the wound with wound cleanser prior to applying a clean dressing using gauze sponges, not tissue or cotton balls. Cleanser: Soap and Water [facility]) Every Other Day/15 Days Discharge Instructions: May shower and wash wound with dial antibacterial soap and water prior to dressing change. Peri-Wound Care: Triamcinolone 15 (g) (Home Health) Every Other Day/15 Days Discharge Instructions: Use  triamcinolone 15 (g) as directed Peri-Wound Care: Sween Lotion (Moisturizing lotion) (Home Health) Every Other Day/15 Days Discharge Instructions: Apply moisturizing lotion as directed Prim Dressing: PolyMem Silver Non-Adhesive Dressing, 4.25x4.25 in Every Other Day/15 Days ary Discharge Instructions: Apply to wound bed as instructed Secondary Dressing: Woven Gauze Sponge, Non-Sterile 4x4 in (Home Health) (Generic) Every Other Day/15 Days Discharge Instructions: Apply over primary dressing as directed. Com pression Wrap: Kerlix Roll 4.5x3.1 (in/yd) (Home Health) (Generic) Every Other Day/15 Days Discharge Instructions: Apply Kerlix and Coban compression as directed. Com pression Wrap: Coban Self-Adherent Wrap 4x5 (in/yd) (Home Health) (Generic) Every Other Day/15 Days Discharge Instructions: Apply over Kerlix as directed. 1. I am going to use polymen Ag on all of these wounds. Again the issue on the heel is absorption of debris and debridement. 2. Vascular I think is important here his appointment is on the 25th now. These look ischemic Electronic Signature(s) Signed: 01/25/2021 10:14:24 AM By: Linton Ham MD Entered By: Linton Ham on 01/24/2021 11:20:39 -------------------------------------------------------------------------------- SuperBill Details Patient Name: Date of Service: [name] [name] F. 01/24/2021 Medical Record Number: [id number] Patient Account Number: [id number] Date of Birth/Sex: Treating RN: February 18, 1946 (76 y.o. 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 S80.211D Abrasion, right knee, subsequent encounter Facility Procedures CPT4 Code: [id number] Description: 11042 - DEB SUBQ TISSUE 20 SQ CM/< ICD-10 Diagnosis Description L97.418 Non-pressure chronic ulcer of right heel and midfoot with other specified sev Modifier: erity Quantity: 1 Physician Procedures : CPT4 Code Description Modifier [id number] - WC PHYS SUBQ TISS 20 SQ CM ICD-10 Diagnosis Description L97.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 Compression Wrap: Coban Self-Adherent Wrap 4x5 (in/yd) (Generic) Every Other Day/15 Days Discharge Instructions: Apply over Kerlix as directed. Wound #42 - Lower Leg Wound Laterality: Right, Anterior, Proximal Cleanser: Wound Cleanser (Generic) Every Other Day/15 Days Discharge Instructions: Cleanse the wound with wound cleanser prior to applying a clean dressing using gauze sponges, not tissue or cotton balls. Cleanser: Soap and Water Every Other Day/15 Days Discharge Instructions: May shower and wash wound with  dial antibacterial soap and water prior to dressing change. Peri-Wound Care: Triamcinolone 15 (g) Every Other Day/15 Days Discharge Instructions: Use triamcinolone 15 (g) as directed Peri-Wound Care: Sween Lotion (Moisturizing lotion) Every Other Day/15 Days Discharge Instructions: Apply moisturizing lotion as directed Prim Dressing: PolyMem Silver Non-Adhesive Dressing, 4.25x4.25 in Every Other Day/15 Days ary Discharge Instructions: Apply to wound bed as instructed Secondary Dressing: Woven Gauze Sponge, Non-Sterile 4x4 in (Generic) Every Other Day/15 Days Discharge Instructions: Apply over primary dressing as directed. Compression Wrap: Kerlix Roll 4.5x3.1 (in/yd) (Generic) Every Other Day/15 Days Discharge Instructions: Apply Kerlix and Coban compression as directed. Compression Wrap: Coban Self-Adherent Wrap 4x5 (in/yd) (Generic) Every Other Day/15 Days Discharge Instructions: Apply over Kerlix as directed. Radiology X-ray, right heel - Non healing wound on right heel - (ICD10 E11.621 - Type 2 diabetes mellitus with foot ulcer) Electronic Signature(s) Signed: 01/24/2021 5:33:33 PM By: Levan Hurst RN, BSN Signed: 01/25/2021 10:14:24 AM By: Linton Ham MD Entered By: Levan Hurst on 01/24/2021 12:04:03 Prescription 01/24/2021 -------------------------------------------------------------------------------- [name] MD Patient Name: Provider: 1945/04/26 [id number] Date of Birth: NPI#: [name] [id number] Sex: DEA #: [id number] Phone #: License #: [place] Patient Address: [address] [place], [place] [address] [place], [place] [address] [place] Provider's Orders X-ray, right heel - ICD10: E11.621 - Non healing wound on right heel Hand Signature: Date(s): Electronic Signature(s) Signed: 01/24/2021 5:33:33 PM By: Levan Hurst RN, BSN Signed:  01/25/2021 10:14:24 AM By: Linton Ham MD Entered By: Levan Hurst on 01/24/2021 12:04:05 -------------------------------------------------------------------------------- Problem List Details Patient Name: Date of Service: [name] [name] F. 01/24/2021 10:30 A M Medical Record Number: [id number] Patient Account Number: [id number] Date of Birth/Sex: Treating RN: May 18, 1945 (75 y.o. [name] [name] Primary Care Provider: [name], [name] Other Clinician: Referring Provider: Treating Provider/Extender: Terissa Haffey [name] [name] Weeks in Treatment: 249 Active Problems ICD-10 Encounter Code Description Active Date MDM Diagnosis E11.621 Type 2 diabetes mellitus with foot ulcer 04/17/2016 No Yes L97.518 Non-pressure chronic ulcer of other part of right foot with other specified 05/17/2020 No Yes severity L97.218 Non-pressure chronic ulcer of right calf with other specified severity 11/01/2020 No Yes S80.211D Abrasion, right knee, subsequent encounter 11/01/2020 No Yes 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 Inactive Date L89.613 Pressure ulcer of right heel, stage 3 04/17/2016 04/17/2016 L97.221 Non-pressure chronic ulcer of left calf limited to breakdown of skin 08/15/2017 08/15/2017 L97.321 Non-pressure chronic ulcer of left ankle limited to breakdown of skin 07/01/2018 07/01/2018 L03.032 Cellulitis of left toe 01/06/2019 01/06/2019 Y07.371 Pressure ulcer of left heel, stage 3 12/04/2016 12/04/2016 I25.119 Atherosclerotic heart disease of native coronary artery with unspecified angina 04/17/2016 04/17/2016 pectoris L97.821 Non-pressure chronic ulcer of other part of left lower leg limited to breakdown of skin 01/06/2019 01/06/2019 I50.31  Days Discharge Instructions: Apply Kerlix and Coban compression as directed. Compression Wrap: Coban Self-Adherent Wrap 4x5 (in/yd) (Generic) Every Other Day/15 Days Discharge Instructions: Apply over Kerlix as directed. Wound #42 - Lower Leg Wound Laterality: Right, Anterior, Proximal Cleanser: Wound Cleanser (Generic) Every Other Day/15 Days Discharge Instructions: Cleanse the wound with wound cleanser prior to applying a clean dressing using gauze sponges, not tissue or cotton balls. Cleanser: Soap and Water Every Other Day/15 Days Discharge Instructions: May shower and wash wound with  dial antibacterial soap and water prior to dressing change. Peri-Wound Care: Triamcinolone 15 (g) Every Other Day/15 Days Discharge Instructions: Use triamcinolone 15 (g) as directed Peri-Wound Care: Sween Lotion (Moisturizing lotion) Every Other Day/15 Days Discharge Instructions: Apply moisturizing lotion as directed Prim Dressing: PolyMem Silver Non-Adhesive Dressing, 4.25x4.25 in Every Other Day/15 Days ary Discharge Instructions: Apply to wound bed as instructed Secondary Dressing: Woven Gauze Sponge, Non-Sterile 4x4 in (Generic) Every Other Day/15 Days Discharge Instructions: Apply over primary dressing as directed. Compression Wrap: Kerlix Roll 4.5x3.1 (in/yd) (Generic) Every Other Day/15 Days Discharge Instructions: Apply Kerlix and Coban compression as directed. Compression Wrap: Coban Self-Adherent Wrap 4x5 (in/yd) (Generic) Every Other Day/15 Days Discharge Instructions: Apply over Kerlix as directed. Radiology X-ray, right heel - Non healing wound on right heel - (ICD10 E11.621 - Type 2 diabetes mellitus with foot ulcer) Electronic Signature(s) Signed: 01/24/2021 5:33:33 PM By: Levan Hurst RN, BSN Signed: 01/25/2021 10:14:24 AM By: Linton Ham MD Entered By: Levan Hurst on 01/24/2021 12:04:03 Prescription 01/24/2021 -------------------------------------------------------------------------------- [name] MD Patient Name: Provider: 1945/04/26 [id number] Date of Birth: NPI#: [name] [id number] Sex: DEA #: [id number] Phone #: License #: [place] Patient Address: [place], [place] [address] [place], [place] [address] [place] Provider's Orders X-ray, right heel - ICD10: E11.621 - Non healing wound on right heel Hand Signature: Date(s): Electronic Signature(s) Signed: 01/24/2021 5:33:33 PM By: Levan Hurst RN, BSN Signed:  01/25/2021 10:14:24 AM By: Linton Ham MD Entered By: Levan Hurst on 01/24/2021 12:04:05 -------------------------------------------------------------------------------- Problem List Details Patient Name: Date of Service: [name] [name] F. 01/24/2021 10:30 A M Medical Record Number: [id number] Patient Account Number: [id number] Date of Birth/Sex: Treating RN: May 18, 1945 (75 y.o. [name] [name] Primary Care Provider: [name], [name] Other Clinician: Referring Provider: Treating Provider/Extender: Terissa Haffey [name] [name] Weeks in Treatment: 249 Active Problems ICD-10 Encounter Code Description Active Date MDM Diagnosis E11.621 Type 2 diabetes mellitus with foot ulcer 04/17/2016 No Yes L97.518 Non-pressure chronic ulcer of other part of right foot with other specified 05/17/2020 No Yes severity L97.218 Non-pressure chronic ulcer of right calf with other specified severity 11/01/2020 No Yes S80.211D Abrasion, right knee, subsequent encounter 11/01/2020 No Yes 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 Inactive Date L89.613 Pressure ulcer of right heel, stage 3 04/17/2016 04/17/2016 L97.221 Non-pressure chronic ulcer of left calf limited to breakdown of skin 08/15/2017 08/15/2017 L97.321 Non-pressure chronic ulcer of left ankle limited to breakdown of skin 07/01/2018 07/01/2018 L03.032 Cellulitis of left toe 01/06/2019 01/06/2019 Y07.371 Pressure ulcer of left heel, stage 3 12/04/2016 12/04/2016 I25.119 Atherosclerotic heart disease of native coronary artery with unspecified angina 04/17/2016 04/17/2016 pectoris L97.821 Non-pressure chronic ulcer of other part of left lower leg limited to breakdown of skin 01/06/2019 01/06/2019 I50.31  [name] [name] ([id number]) Visit Report for 01/24/2021 Debridement Details Patient Name: Date of Service: [name] [name] [facility] F. 01/24/2021 10:30 A M Medical Record Number: [id number] Patient Account Number: [id number] Date of Birth/Sex: Treating RN: [birth year] (75 y.o. [name] [name] Primary Care Provider: [name], [name] Other Clinician: Referring Provider: Treating Provider/Extender: [name] [name] [name] [name] Weeks in Treatment: 249 Debridement Performed for Assessment: Wound #39 Right Calcaneus Performed By: Physician [name] [name]., MD Debridement Type: Debridement Severity of Tissue Pre Debridement: Fat layer exposed Level of Consciousness (Pre-procedure): Awake and Alert Pre-procedure Verification/Time Out Yes - 11:05 Taken: Start Time: 11:05 T Area Debrided (L x W): otal 1.9 (cm) x 3.4 (cm) = 6.46 (cm) Tissue and other material debrided: Viable, Non-Viable, Eschar, Slough, Subcutaneous, Slough Level: Skin/Subcutaneous Tissue Debridement Description: Excisional Instrument: Curette, Forceps, Scissors Bleeding: Minimum Hemostasis Achieved: Pressure End Time: 11:06 Procedural Pain: 0 Post Procedural Pain: 0 Response to Treatment: Procedure was tolerated well Level of Consciousness (Post- Awake and Alert procedure): Post Debridement Measurements of Total Wound Length: (cm) 1.9 Stage: Unstageable/Unclassified Width: (cm) 3.4 Depth: (cm) 0.5 Volume: (cm) 2.537 Character of Wound/Ulcer Post Debridement: Requires Further Debridement Severity of Tissue Post Debridement: Fat layer exposed Post Procedure Diagnosis Same as Pre-procedure Electronic Signature(s) Signed: 01/24/2021 5:32:19 PM By: Deon Pilling RN, BSN Signed: 01/25/2021 10:14:24 AM By: Linton Ham MD Entered By: Linton Ham on 01/24/2021 11:12:22 -------------------------------------------------------------------------------- HPI Details Patient Name: Date of Service: [name] [name] F.  01/24/2021 10:30 A M Medical Record Number: [id number] Patient Account Number: [id number] Date of Birth/Sex: Treating RN: [birth year] (75 y.o. [name] [name] Primary Care Provider: [name], [name] Other Clinician: Referring Provider: Treating Provider/Extender: Yaniah Thiemann [name] [name] Weeks in Treatment: 249 History of Present Illness Location: On the left and right lateral forefoot which has been there for about 6 months Quality: Patient reports No Pain. Severity: Patient states wound(s) are getting worse. Duration: Patient has had the wound for > 6 months prior to seeking treatment at the wound center Context: The wound would happen gradually Modifying Factors: Patient wound(s)/ulcer(s) are worsening due to :continual drainage from the wound ssociated Signs and Symptoms: Patient reports having increase discharge. A HPI Description: This patient returns after being seen here till the end of August and he was lost to follow-up. he has been quite debilitated laying in bed most of the time and his condition has deteriorated significantly. He has multiple ulcerations on the heel lateral forefoot and some of his toes. ======== Old notes: 75 year old male known to our practice when he was seen here in February and March and was lost to follow-up when he was admitted to hospital with various medical problems including coronary artery disease and a stroke. Now returns with the problem on the left forefoot where he has an ulceration and this has been there for about 6 months. most recently he was in hospital between July 6 and July 16, when he was admitted and treated for acute respiratory failure is secondary to aspiration pneumonia, large non-STEMI, ischemic cardiomyopathy with systolic and diastolic congestive heart failure with ejection fraction about 15-20%, ventricular tachycardia and has been treated with amiodarone, acute on careful up at the common new acute CVA, acute chronic kidney  disease stage III, anemia, uncontrolled diabetes mellitus with last hemoglobin A1c being 12%. He has had persistent hyperglycemia given recently. Patient has a past medical history of diabetes mellitus, hypertension, combined systolic and diastolic heart failure, peripheral neuropathy, gout, cardiomyopathy with ejection fraction of about 10-15%,  Days Discharge Instructions: Apply Kerlix and Coban compression as directed. Compression Wrap: Coban Self-Adherent Wrap 4x5 (in/yd) (Generic) Every Other Day/15 Days Discharge Instructions: Apply over Kerlix as directed. Wound #42 - Lower Leg Wound Laterality: Right, Anterior, Proximal Cleanser: Wound Cleanser (Generic) Every Other Day/15 Days Discharge Instructions: Cleanse the wound with wound cleanser prior to applying a clean dressing using gauze sponges, not tissue or cotton balls. Cleanser: Soap and Water Every Other Day/15 Days Discharge Instructions: May shower and wash wound with  dial antibacterial soap and water prior to dressing change. Peri-Wound Care: Triamcinolone 15 (g) Every Other Day/15 Days Discharge Instructions: Use triamcinolone 15 (g) as directed Peri-Wound Care: Sween Lotion (Moisturizing lotion) Every Other Day/15 Days Discharge Instructions: Apply moisturizing lotion as directed Prim Dressing: PolyMem Silver Non-Adhesive Dressing, 4.25x4.25 in Every Other Day/15 Days ary Discharge Instructions: Apply to wound bed as instructed Secondary Dressing: Woven Gauze Sponge, Non-Sterile 4x4 in (Generic) Every Other Day/15 Days Discharge Instructions: Apply over primary dressing as directed. Compression Wrap: Kerlix Roll 4.5x3.1 (in/yd) (Generic) Every Other Day/15 Days Discharge Instructions: Apply Kerlix and Coban compression as directed. Compression Wrap: Coban Self-Adherent Wrap 4x5 (in/yd) (Generic) Every Other Day/15 Days Discharge Instructions: Apply over Kerlix as directed. Radiology X-ray, right heel - Non healing wound on right heel - (ICD10 E11.621 - Type 2 diabetes mellitus with foot ulcer) Electronic Signature(s) Signed: 01/24/2021 5:33:33 PM By: Levan Hurst RN, BSN Signed: 01/25/2021 10:14:24 AM By: Linton Ham MD Entered By: Levan Hurst on 01/24/2021 12:04:03 Prescription 01/24/2021 -------------------------------------------------------------------------------- [name] MD Patient Name: Provider: 1945/04/26 [id number] Date of Birth: NPI#: [name] [id number] Sex: DEA #: [id number] Phone #: License #: [place] Patient Address: [address] [place], [place] [address] [place], [place] [address] [place] Provider's Orders X-ray, right heel - ICD10: E11.621 - Non healing wound on right heel Hand Signature: Date(s): Electronic Signature(s) Signed: 01/24/2021 5:33:33 PM By: Levan Hurst RN, BSN Signed:  01/25/2021 10:14:24 AM By: Linton Ham MD Entered By: Levan Hurst on 01/24/2021 12:04:05 -------------------------------------------------------------------------------- Problem List Details Patient Name: Date of Service: [name] [name] F. 01/24/2021 10:30 A M Medical Record Number: [id number] Patient Account Number: [id number] Date of Birth/Sex: Treating RN: May 18, 1945 (75 y.o. [name] [name] Primary Care Provider: [name], [name] Other Clinician: Referring Provider: Treating Provider/Extender: Terissa Haffey [name] [name] Weeks in Treatment: 249 Active Problems ICD-10 Encounter Code Description Active Date MDM Diagnosis E11.621 Type 2 diabetes mellitus with foot ulcer 04/17/2016 No Yes L97.518 Non-pressure chronic ulcer of other part of right foot with other specified 05/17/2020 No Yes severity L97.218 Non-pressure chronic ulcer of right calf with other specified severity 11/01/2020 No Yes S80.211D Abrasion, right knee, subsequent encounter 11/01/2020 No Yes 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 Inactive Date L89.613 Pressure ulcer of right heel, stage 3 04/17/2016 04/17/2016 L97.221 Non-pressure chronic ulcer of left calf limited to breakdown of skin 08/15/2017 08/15/2017 L97.321 Non-pressure chronic ulcer of left ankle limited to breakdown of skin 07/01/2018 07/01/2018 L03.032 Cellulitis of left toe 01/06/2019 01/06/2019 Y07.371 Pressure ulcer of left heel, stage 3 12/04/2016 12/04/2016 I25.119 Atherosclerotic heart disease of native coronary artery with unspecified angina 04/17/2016 04/17/2016 pectoris L97.821 Non-pressure chronic ulcer of other part of left lower leg limited to breakdown of skin 01/06/2019 01/06/2019 I50.31  Days Discharge Instructions: Apply Kerlix and Coban compression as directed. Compression Wrap: Coban Self-Adherent Wrap 4x5 (in/yd) (Generic) Every Other Day/15 Days Discharge Instructions: Apply over Kerlix as directed. Wound #42 - Lower Leg Wound Laterality: Right, Anterior, Proximal Cleanser: Wound Cleanser (Generic) Every Other Day/15 Days Discharge Instructions: Cleanse the wound with wound cleanser prior to applying a clean dressing using gauze sponges, not tissue or cotton balls. Cleanser: Soap and Water Every Other Day/15 Days Discharge Instructions: May shower and wash wound with  dial antibacterial soap and water prior to dressing change. Peri-Wound Care: Triamcinolone 15 (g) Every Other Day/15 Days Discharge Instructions: Use triamcinolone 15 (g) as directed Peri-Wound Care: Sween Lotion (Moisturizing lotion) Every Other Day/15 Days Discharge Instructions: Apply moisturizing lotion as directed Prim Dressing: PolyMem Silver Non-Adhesive Dressing, 4.25x4.25 in Every Other Day/15 Days ary Discharge Instructions: Apply to wound bed as instructed Secondary Dressing: Woven Gauze Sponge, Non-Sterile 4x4 in (Generic) Every Other Day/15 Days Discharge Instructions: Apply over primary dressing as directed. Compression Wrap: Kerlix Roll 4.5x3.1 (in/yd) (Generic) Every Other Day/15 Days Discharge Instructions: Apply Kerlix and Coban compression as directed. Compression Wrap: Coban Self-Adherent Wrap 4x5 (in/yd) (Generic) Every Other Day/15 Days Discharge Instructions: Apply over Kerlix as directed. Radiology X-ray, right heel - Non healing wound on right heel - (ICD10 E11.621 - Type 2 diabetes mellitus with foot ulcer) Electronic Signature(s) Signed: 01/24/2021 5:33:33 PM By: Levan Hurst RN, BSN Signed: 01/25/2021 10:14:24 AM By: Linton Ham MD Entered By: Levan Hurst on 01/24/2021 12:04:03 Prescription 01/24/2021 -------------------------------------------------------------------------------- [name] MD Patient Name: Provider: 1945/04/26 [id number] Date of Birth: NPI#: [name] [id number] Sex: DEA #: [id number] Phone #: License #: [place] Patient Address: [address] [place], [place] [address] [place], [place] [address] [place] Provider's Orders X-ray, right heel - ICD10: E11.621 - Non healing wound on right heel Hand Signature: Date(s): Electronic Signature(s) Signed: 01/24/2021 5:33:33 PM By: Levan Hurst RN, BSN Signed:  01/25/2021 10:14:24 AM By: Linton Ham MD Entered By: Levan Hurst on 01/24/2021 12:04:05 -------------------------------------------------------------------------------- Problem List Details Patient Name: Date of Service: [name] [name] F. 01/24/2021 10:30 A M Medical Record Number: [id number] Patient Account Number: [id number] Date of Birth/Sex: Treating RN: May 18, 1945 (75 y.o. [name] [name] Primary Care Provider: [name], [name] Other Clinician: Referring Provider: Treating Provider/Extender: Terissa Haffey [name] [name] Weeks in Treatment: 249 Active Problems ICD-10 Encounter Code Description Active Date MDM Diagnosis E11.621 Type 2 diabetes mellitus with foot ulcer 04/17/2016 No Yes L97.518 Non-pressure chronic ulcer of other part of right foot with other specified 05/17/2020 No Yes severity L97.218 Non-pressure chronic ulcer of right calf with other specified severity 11/01/2020 No Yes S80.211D Abrasion, right knee, subsequent encounter 11/01/2020 No Yes 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 Inactive Date L89.613 Pressure ulcer of right heel, stage 3 04/17/2016 04/17/2016 L97.221 Non-pressure chronic ulcer of left calf limited to breakdown of skin 08/15/2017 08/15/2017 L97.321 Non-pressure chronic ulcer of left ankle limited to breakdown of skin 07/01/2018 07/01/2018 L03.032 Cellulitis of left toe 01/06/2019 01/06/2019 Y07.371 Pressure ulcer of left heel, stage 3 12/04/2016 12/04/2016 I25.119 Atherosclerotic heart disease of native coronary artery with unspecified angina 04/17/2016 04/17/2016 pectoris L97.821 Non-pressure chronic ulcer of other part of left lower leg limited to breakdown of skin 01/06/2019 01/06/2019 I50.31  supposed to have a follow-up with vascular however for some reason this is been delayed till 10/25. 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 He is not complaining of pain. Some of the wounds including In his right dorsal foot actually looks some better but the improvement is minimal Electronic Signature(s) Signed: 01/25/2021 10:14:24 AM By: Linton Ham MD Entered By: Linton Ham on 01/24/2021 11:15:49 -------------------------------------------------------------------------------- Physical Exam Details Patient Name: Date of Service: [name] [name] F. 01/24/2021 10:30 A M Medical Record Number: [id number] Patient Account Number: [id number] Date of Birth/Sex: Treating RN: Dec 24, 1945 (75 y.o. [name] [name] Primary Care Provider: [name], [name] Other Clinician: Referring Provider: Treating Provider/Extender: [name] [name] [name] [name] Weeks in Treatment: 249 Constitutional Sitting or standing Blood Pressure is within target range for patient.. Pulse regular and within target range for patient.Marland Kitchen Respirations regular, non-labored and within target range.. Temperature is normal and within the target range for the patient.Marland Kitchen Appears in no distress. Notes Wound exam; right dorsal foot has a better looking surface. No debridement is required right lateral leg again a reasonably clean surface I think the dimensions here may be slightly smaller. The area on the right  lateral heel has completely necrotic surface. Debrided with a #5 curette then pickups and scissors into subcutaneous tissue. Electronic Signature(s) Signed: 01/25/2021 10:14:24 AM By: Linton Ham MD Entered By: Linton Ham on 01/24/2021 11:19:05 -------------------------------------------------------------------------------- Physician Orders Details Patient Name: Date of Service: [name] [name] F. 01/24/2021 10:30 A M Medical Record Number: [id number] Patient Account Number: [id number] Date of Birth/Sex: Treating RN: January 23, 1946 (74 y.o. 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 - Dr. Dellia Nims Return A Bathing/ Shower/ Hygiene May shower with protection but do not get wound dressing(s) wet. Edema Control - Lymphedema / SCD / Other Elevate legs to the level of the heart or above for 30 minutes daily and/or when sitting, a frequency of: - throughout the day Avoid standing for long periods of time. Off-Loading Other: - multipodus boots to both feet Wound Treatment Wound #25 - Lower Leg Wound Laterality: Right, Lateral Cleanser: Wound Cleanser (Generic) Every Other Day/15 Days Discharge Instructions: Cleanse the wound with wound cleanser prior to applying a clean dressing using gauze sponges, not tissue or cotton  balls. Cleanser: Soap and Water Every Other Day/15 Days Discharge Instructions: May shower and wash wound with dial antibacterial soap and water prior to dressing change. Peri-Wound Care: Triamcinolone 15 (g) Every Other Day/15 Days Discharge Instructions: Use triamcinolone 15 (g) as directed Peri-Wound Care: Sween Lotion (Moisturizing lotion) Every Other Day/15 Days Discharge Instructions: Apply moisturizing lotion as directed Prim Dressing: PolyMem Silver Non-Adhesive Dressing, 4.25x4.25 in (DME) (Generic) Every Other Day/15 Days ary Discharge Instructions: Apply to wound bed as instructed Secondary Dressing: Woven Gauze Sponge, Non-Sterile 4x4 in (Generic) Every Other Day/15 Days Discharge Instructions: Apply over primary dressing as directed. Secondary Dressing: ABD Pad, 5x9 (Generic) Every Other Day/15 Days Discharge Instructions: Apply  [name] [name] ([id number]) Visit Report for 01/24/2021 Debridement Details Patient Name: Date of Service: [name] [name] [facility] F. 01/24/2021 10:30 A M Medical Record Number: [id number] Patient Account Number: [id number] Date of Birth/Sex: Treating RN: [birth year] (75 y.o. [name] [name] Primary Care Provider: [name], [name] Other Clinician: Referring Provider: Treating Provider/Extender: [name] [name] [name] [name] Weeks in Treatment: 249 Debridement Performed for Assessment: Wound #39 Right Calcaneus Performed By: Physician [name] [name]., MD Debridement Type: Debridement Severity of Tissue Pre Debridement: Fat layer exposed Level of Consciousness (Pre-procedure): Awake and Alert Pre-procedure Verification/Time Out Yes - 11:05 Taken: Start Time: 11:05 T Area Debrided (L x W): otal 1.9 (cm) x 3.4 (cm) = 6.46 (cm) Tissue and other material debrided: Viable, Non-Viable, Eschar, Slough, Subcutaneous, Slough Level: Skin/Subcutaneous Tissue Debridement Description: Excisional Instrument: Curette, Forceps, Scissors Bleeding: Minimum Hemostasis Achieved: Pressure End Time: 11:06 Procedural Pain: 0 Post Procedural Pain: 0 Response to Treatment: Procedure was tolerated well Level of Consciousness (Post- Awake and Alert procedure): Post Debridement Measurements of Total Wound Length: (cm) 1.9 Stage: Unstageable/Unclassified Width: (cm) 3.4 Depth: (cm) 0.5 Volume: (cm) 2.537 Character of Wound/Ulcer Post Debridement: Requires Further Debridement Severity of Tissue Post Debridement: Fat layer exposed Post Procedure Diagnosis Same as Pre-procedure Electronic Signature(s) Signed: 01/24/2021 5:32:19 PM By: Deon Pilling RN, BSN Signed: 01/25/2021 10:14:24 AM By: Linton Ham MD Entered By: Linton Ham on 01/24/2021 11:12:22 -------------------------------------------------------------------------------- HPI Details Patient Name: Date of Service: [name] [name] F.  01/24/2021 10:30 A M Medical Record Number: [id number] Patient Account Number: [id number] Date of Birth/Sex: Treating RN: [birth year] (75 y.o. [name] [name] Primary Care Provider: [name], [name] Other Clinician: Referring Provider: Treating Provider/Extender: Yaniah Thiemann [name] [name] Weeks in Treatment: 249 History of Present Illness Location: On the left and right lateral forefoot which has been there for about 6 months Quality: Patient reports No Pain. Severity: Patient states wound(s) are getting worse. Duration: Patient has had the wound for > 6 months prior to seeking treatment at the wound center Context: The wound would happen gradually Modifying Factors: Patient wound(s)/ulcer(s) are worsening due to :continual drainage from the wound ssociated Signs and Symptoms: Patient reports having increase discharge. A HPI Description: This patient returns after being seen here till the end of August and he was lost to follow-up. he has been quite debilitated laying in bed most of the time and his condition has deteriorated significantly. He has multiple ulcerations on the heel lateral forefoot and some of his toes. ======== Old notes: 75 year old male known to our practice when he was seen here in February and March and was lost to follow-up when he was admitted to hospital with various medical problems including coronary artery disease and a stroke. Now returns with the problem on the left forefoot where he has an ulceration and this has been there for about 6 months. most recently he was in hospital between July 6 and July 16, when he was admitted and treated for acute respiratory failure is secondary to aspiration pneumonia, large non-STEMI, ischemic cardiomyopathy with systolic and diastolic congestive heart failure with ejection fraction about 15-20%, ventricular tachycardia and has been treated with amiodarone, acute on careful up at the common new acute CVA, acute chronic kidney  disease stage III, anemia, uncontrolled diabetes mellitus with last hemoglobin A1c being 12%. He has had persistent hyperglycemia given recently. Patient has a past medical history of diabetes mellitus, hypertension, combined systolic and diastolic heart failure, peripheral neuropathy, gout, cardiomyopathy with ejection fraction of about 10-15%,  wound bed as instructed Secondary Dressing: Woven Gauze Sponge, Non-Sterile 4x4 in [facility]) (Generic) Every Other Day/15 Days Discharge Instructions: Apply over primary dressing as directed. Com pression Wrap: Kerlix Roll 4.5x3.1 (in/yd) (Home Health) (Generic) Every Other Day/15 Days Discharge Instructions: Apply Kerlix and Coban compression as directed. Com pression Wrap: Coban Self-Adherent Wrap 4x5 (in/yd) (Home Health) (Generic) Every Other Day/15 Days Discharge Instructions: Apply over Kerlix as directed. WOUND #42: - Lower Leg Wound Laterality: Right, Anterior, Proximal Cleanser: Wound Cleanser (Home Health) (Generic) Every Other Day/15 Days Discharge Instructions: Cleanse the wound with wound cleanser prior to applying a clean dressing using gauze sponges, not tissue or cotton balls. Cleanser: Soap and Water [facility]) Every Other Day/15 Days Discharge Instructions: May shower and wash wound with dial antibacterial soap and water prior to dressing change. Peri-Wound Care: Triamcinolone 15 (g) (Home Health) Every Other Day/15 Days Discharge Instructions: Use  triamcinolone 15 (g) as directed Peri-Wound Care: Sween Lotion (Moisturizing lotion) (Home Health) Every Other Day/15 Days Discharge Instructions: Apply moisturizing lotion as directed Prim Dressing: PolyMem Silver Non-Adhesive Dressing, 4.25x4.25 in Every Other Day/15 Days ary Discharge Instructions: Apply to wound bed as instructed Secondary Dressing: Woven Gauze Sponge, Non-Sterile 4x4 in (Home Health) (Generic) Every Other Day/15 Days Discharge Instructions: Apply over primary dressing as directed. Com pression Wrap: Kerlix Roll 4.5x3.1 (in/yd) (Home Health) (Generic) Every Other Day/15 Days Discharge Instructions: Apply Kerlix and Coban compression as directed. Com pression Wrap: Coban Self-Adherent Wrap 4x5 (in/yd) (Home Health) (Generic) Every Other Day/15 Days Discharge Instructions: Apply over Kerlix as directed. 1. I am going to use polymen Ag on all of these wounds. Again the issue on the heel is absorption of debris and debridement. 2. Vascular I think is important here his appointment is on the 25th now. These look ischemic Electronic Signature(s) Signed: 01/25/2021 10:14:24 AM By: Linton Ham MD Entered By: Linton Ham on 01/24/2021 11:20:39 -------------------------------------------------------------------------------- SuperBill Details Patient Name: Date of Service: [name] [name] F. 01/24/2021 Medical Record Number: [id number] Patient Account Number: [id number] Date of Birth/Sex: Treating RN: February 18, 1946 (76 y.o. 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 S80.211D Abrasion, right knee, subsequent encounter Facility Procedures CPT4 Code: [id number] Description: 11042 - DEB SUBQ TISSUE 20 SQ CM/< ICD-10 Diagnosis Description L97.418 Non-pressure chronic ulcer of right heel and midfoot with other specified sev Modifier: erity Quantity: 1 Physician Procedures : CPT4 Code Description Modifier [id number] - WC PHYS SUBQ TISS 20 SQ CM ICD-10 Diagnosis Description L97.418 Non-pressure chronic ulcer of right heel and midfoot with other specified severity Quantity: 1 Electronic Signature(s) Signed: 01/25/2021 10:14:24 AM By: Linton Ham MD Entered By: Linton Ham on 01/24/2021 11:20:58  [name] [name] ([id number]) Visit Report for 01/24/2021 Debridement Details Patient Name: Date of Service: [name] [name] [facility] F. 01/24/2021 10:30 A M Medical Record Number: [id number] Patient Account Number: [id number] Date of Birth/Sex: Treating RN: [birth year] (75 y.o. [name] [name] Primary Care Provider: [name], [name] Other Clinician: Referring Provider: Treating Provider/Extender: [name] [name] [name] [name] Weeks in Treatment: 249 Debridement Performed for Assessment: Wound #39 Right Calcaneus Performed By: Physician [name] [name]., MD Debridement Type: Debridement Severity of Tissue Pre Debridement: Fat layer exposed Level of Consciousness (Pre-procedure): Awake and Alert Pre-procedure Verification/Time Out Yes - 11:05 Taken: Start Time: 11:05 T Area Debrided (L x W): otal 1.9 (cm) x 3.4 (cm) = 6.46 (cm) Tissue and other material debrided: Viable, Non-Viable, Eschar, Slough, Subcutaneous, Slough Level: Skin/Subcutaneous Tissue Debridement Description: Excisional Instrument: Curette, Forceps, Scissors Bleeding: Minimum Hemostasis Achieved: Pressure End Time: 11:06 Procedural Pain: 0 Post Procedural Pain: 0 Response to Treatment: Procedure was tolerated well Level of Consciousness (Post- Awake and Alert procedure): Post Debridement Measurements of Total Wound Length: (cm) 1.9 Stage: Unstageable/Unclassified Width: (cm) 3.4 Depth: (cm) 0.5 Volume: (cm) 2.537 Character of Wound/Ulcer Post Debridement: Requires Further Debridement Severity of Tissue Post Debridement: Fat layer exposed Post Procedure Diagnosis Same as Pre-procedure Electronic Signature(s) Signed: 01/24/2021 5:32:19 PM By: Deon Pilling RN, BSN Signed: 01/25/2021 10:14:24 AM By: Linton Ham MD Entered By: Linton Ham on 01/24/2021 11:12:22 -------------------------------------------------------------------------------- HPI Details Patient Name: Date of Service: [name] [name] F.  01/24/2021 10:30 A M Medical Record Number: [id number] Patient Account Number: [id number] Date of Birth/Sex: Treating RN: [birth year] (75 y.o. [name] [name] Primary Care Provider: [name], [name] Other Clinician: Referring Provider: Treating Provider/Extender: Yaniah Thiemann [name] [name] Weeks in Treatment: 249 History of Present Illness Location: On the left and right lateral forefoot which has been there for about 6 months Quality: Patient reports No Pain. Severity: Patient states wound(s) are getting worse. Duration: Patient has had the wound for > 6 months prior to seeking treatment at the wound center Context: The wound would happen gradually Modifying Factors: Patient wound(s)/ulcer(s) are worsening due to :continual drainage from the wound ssociated Signs and Symptoms: Patient reports having increase discharge. A HPI Description: This patient returns after being seen here till the end of August and he was lost to follow-up. he has been quite debilitated laying in bed most of the time and his condition has deteriorated significantly. He has multiple ulcerations on the heel lateral forefoot and some of his toes. ======== Old notes: 75 year old male known to our practice when he was seen here in February and March and was lost to follow-up when he was admitted to hospital with various medical problems including coronary artery disease and a stroke. Now returns with the problem on the left forefoot where he has an ulceration and this has been there for about 6 months. most recently he was in hospital between July 6 and July 16, when he was admitted and treated for acute respiratory failure is secondary to aspiration pneumonia, large non-STEMI, ischemic cardiomyopathy with systolic and diastolic congestive heart failure with ejection fraction about 15-20%, ventricular tachycardia and has been treated with amiodarone, acute on careful up at the common new acute CVA, acute chronic kidney  disease stage III, anemia, uncontrolled diabetes mellitus with last hemoglobin A1c being 12%. He has had persistent hyperglycemia given recently. Patient has a past medical history of diabetes mellitus, hypertension, combined systolic and diastolic heart failure, peripheral neuropathy, gout, cardiomyopathy with ejection fraction of about 10-15%,  supposed to have a follow-up with vascular however for some reason this is been delayed till 10/25. 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 He is not complaining of pain. Some of the wounds including In his right dorsal foot actually looks some better but the improvement is minimal Electronic Signature(s) Signed: 01/25/2021 10:14:24 AM By: Linton Ham MD Entered By: Linton Ham on 01/24/2021 11:15:49 -------------------------------------------------------------------------------- Physical Exam Details Patient Name: Date of Service: [name] [name] F. 01/24/2021 10:30 A M Medical Record Number: [id number] Patient Account Number: [id number] Date of Birth/Sex: Treating RN: Dec 24, 1945 (75 y.o. [name] [name] Primary Care Provider: [name], [name] Other Clinician: Referring Provider: Treating Provider/Extender: [name] [name] [name] [name] Weeks in Treatment: 249 Constitutional Sitting or standing Blood Pressure is within target range for patient.. Pulse regular and within target range for patient.Marland Kitchen Respirations regular, non-labored and within target range.. Temperature is normal and within the target range for the patient.Marland Kitchen Appears in no distress. Notes Wound exam; right dorsal foot has a better looking surface. No debridement is required right lateral leg again a reasonably clean surface I think the dimensions here may be slightly smaller. The area on the right  lateral heel has completely necrotic surface. Debrided with a #5 curette then pickups and scissors into subcutaneous tissue. Electronic Signature(s) Signed: 01/25/2021 10:14:24 AM By: Linton Ham MD Entered By: Linton Ham on 01/24/2021 11:19:05 -------------------------------------------------------------------------------- Physician Orders Details Patient Name: Date of Service: [name] [name] F. 01/24/2021 10:30 A M Medical Record Number: [id number] Patient Account Number: [id number] Date of Birth/Sex: Treating RN: January 23, 1946 (74 y.o. 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 - Dr. Dellia Nims Return A Bathing/ Shower/ Hygiene May shower with protection but do not get wound dressing(s) wet. Edema Control - Lymphedema / SCD / Other Elevate legs to the level of the heart or above for 30 minutes daily and/or when sitting, a frequency of: - throughout the day Avoid standing for long periods of time. Off-Loading Other: - multipodus boots to both feet Wound Treatment Wound #25 - Lower Leg Wound Laterality: Right, Lateral Cleanser: Wound Cleanser (Generic) Every Other Day/15 Days Discharge Instructions: Cleanse the wound with wound cleanser prior to applying a clean dressing using gauze sponges, not tissue or cotton  balls. Cleanser: Soap and Water Every Other Day/15 Days Discharge Instructions: May shower and wash wound with dial antibacterial soap and water prior to dressing change. Peri-Wound Care: Triamcinolone 15 (g) Every Other Day/15 Days Discharge Instructions: Use triamcinolone 15 (g) as directed Peri-Wound Care: Sween Lotion (Moisturizing lotion) Every Other Day/15 Days Discharge Instructions: Apply moisturizing lotion as directed Prim Dressing: PolyMem Silver Non-Adhesive Dressing, 4.25x4.25 in (DME) (Generic) Every Other Day/15 Days ary Discharge Instructions: Apply to wound bed as instructed Secondary Dressing: Woven Gauze Sponge, Non-Sterile 4x4 in (Generic) Every Other Day/15 Days Discharge Instructions: Apply over primary dressing as directed. Secondary Dressing: ABD Pad, 5x9 (Generic) Every Other Day/15 Days Discharge Instructions: Apply  [name] [name] ([id number]) Visit Report for 01/24/2021 Debridement Details Patient Name: Date of Service: [name] [name] [facility] F. 01/24/2021 10:30 A M Medical Record Number: [id number] Patient Account Number: [id number] Date of Birth/Sex: Treating RN: [birth year] (75 y.o. [name] [name] Primary Care Provider: [name], [name] Other Clinician: Referring Provider: Treating Provider/Extender: [name] [name] [name] [name] Weeks in Treatment: 249 Debridement Performed for Assessment: Wound #39 Right Calcaneus Performed By: Physician [name] [name]., MD Debridement Type: Debridement Severity of Tissue Pre Debridement: Fat layer exposed Level of Consciousness (Pre-procedure): Awake and Alert Pre-procedure Verification/Time Out Yes - 11:05 Taken: Start Time: 11:05 T Area Debrided (L x W): otal 1.9 (cm) x 3.4 (cm) = 6.46 (cm) Tissue and other material debrided: Viable, Non-Viable, Eschar, Slough, Subcutaneous, Slough Level: Skin/Subcutaneous Tissue Debridement Description: Excisional Instrument: Curette, Forceps, Scissors Bleeding: Minimum Hemostasis Achieved: Pressure End Time: 11:06 Procedural Pain: 0 Post Procedural Pain: 0 Response to Treatment: Procedure was tolerated well Level of Consciousness (Post- Awake and Alert procedure): Post Debridement Measurements of Total Wound Length: (cm) 1.9 Stage: Unstageable/Unclassified Width: (cm) 3.4 Depth: (cm) 0.5 Volume: (cm) 2.537 Character of Wound/Ulcer Post Debridement: Requires Further Debridement Severity of Tissue Post Debridement: Fat layer exposed Post Procedure Diagnosis Same as Pre-procedure Electronic Signature(s) Signed: 01/24/2021 5:32:19 PM By: Deon Pilling RN, BSN Signed: 01/25/2021 10:14:24 AM By: Linton Ham MD Entered By: Linton Ham on 01/24/2021 11:12:22 -------------------------------------------------------------------------------- HPI Details Patient Name: Date of Service: [name] [name] F.  01/24/2021 10:30 A M Medical Record Number: [id number] Patient Account Number: [id number] Date of Birth/Sex: Treating RN: [birth year] (75 y.o. [name] [name] Primary Care Provider: [name], [name] Other Clinician: Referring Provider: Treating Provider/Extender: Yaniah Thiemann [name] [name] Weeks in Treatment: 249 History of Present Illness Location: On the left and right lateral forefoot which has been there for about 6 months Quality: Patient reports No Pain. Severity: Patient states wound(s) are getting worse. Duration: Patient has had the wound for > 6 months prior to seeking treatment at the wound center Context: The wound would happen gradually Modifying Factors: Patient wound(s)/ulcer(s) are worsening due to :continual drainage from the wound ssociated Signs and Symptoms: Patient reports having increase discharge. A HPI Description: This patient returns after being seen here till the end of August and he was lost to follow-up. he has been quite debilitated laying in bed most of the time and his condition has deteriorated significantly. He has multiple ulcerations on the heel lateral forefoot and some of his toes. ======== Old notes: 75 year old male known to our practice when he was seen here in February and March and was lost to follow-up when he was admitted to hospital with various medical problems including coronary artery disease and a stroke. Now returns with the problem on the left forefoot where he has an ulceration and this has been there for about 6 months. most recently he was in hospital between July 6 and July 16, when he was admitted and treated for acute respiratory failure is secondary to aspiration pneumonia, large non-STEMI, ischemic cardiomyopathy with systolic and diastolic congestive heart failure with ejection fraction about 15-20%, ventricular tachycardia and has been treated with amiodarone, acute on careful up at the common new acute CVA, acute chronic kidney  disease stage III, anemia, uncontrolled diabetes mellitus with last hemoglobin A1c being 12%. He has had persistent hyperglycemia given recently. Patient has a past medical history of diabetes mellitus, hypertension, combined systolic and diastolic heart failure, peripheral neuropathy, gout, cardiomyopathy with ejection fraction of about 10-15%,  [name] [name] ([id number]) Visit Report for 01/24/2021 Debridement Details Patient Name: Date of Service: [name] [name] [facility] F. 01/24/2021 10:30 A M Medical Record Number: [id number] Patient Account Number: [id number] Date of Birth/Sex: Treating RN: [birth year] (75 y.o. [name] [name] Primary Care Provider: [name], [name] Other Clinician: Referring Provider: Treating Provider/Extender: [name] [name] [name] [name] Weeks in Treatment: 249 Debridement Performed for Assessment: Wound #39 Right Calcaneus Performed By: Physician [name] [name]., MD Debridement Type: Debridement Severity of Tissue Pre Debridement: Fat layer exposed Level of Consciousness (Pre-procedure): Awake and Alert Pre-procedure Verification/Time Out Yes - 11:05 Taken: Start Time: 11:05 T Area Debrided (L x W): otal 1.9 (cm) x 3.4 (cm) = 6.46 (cm) Tissue and other material debrided: Viable, Non-Viable, Eschar, Slough, Subcutaneous, Slough Level: Skin/Subcutaneous Tissue Debridement Description: Excisional Instrument: Curette, Forceps, Scissors Bleeding: Minimum Hemostasis Achieved: Pressure End Time: 11:06 Procedural Pain: 0 Post Procedural Pain: 0 Response to Treatment: Procedure was tolerated well Level of Consciousness (Post- Awake and Alert procedure): Post Debridement Measurements of Total Wound Length: (cm) 1.9 Stage: Unstageable/Unclassified Width: (cm) 3.4 Depth: (cm) 0.5 Volume: (cm) 2.537 Character of Wound/Ulcer Post Debridement: Requires Further Debridement Severity of Tissue Post Debridement: Fat layer exposed Post Procedure Diagnosis Same as Pre-procedure Electronic Signature(s) Signed: 01/24/2021 5:32:19 PM By: Deon Pilling RN, BSN Signed: 01/25/2021 10:14:24 AM By: Linton Ham MD Entered By: Linton Ham on 01/24/2021 11:12:22 -------------------------------------------------------------------------------- HPI Details Patient Name: Date of Service: [name] [name] F.  01/24/2021 10:30 A M Medical Record Number: [id number] Patient Account Number: [id number] Date of Birth/Sex: Treating RN: [birth year] (75 y.o. [name] [name] Primary Care Provider: [name], [name] Other Clinician: Referring Provider: Treating Provider/Extender: Yaniah Thiemann [name] [name] Weeks in Treatment: 249 History of Present Illness Location: On the left and right lateral forefoot which has been there for about 6 months Quality: Patient reports No Pain. Severity: Patient states wound(s) are getting worse. Duration: Patient has had the wound for > 6 months prior to seeking treatment at the wound center Context: The wound would happen gradually Modifying Factors: Patient wound(s)/ulcer(s) are worsening due to :continual drainage from the wound ssociated Signs and Symptoms: Patient reports having increase discharge. A HPI Description: This patient returns after being seen here till the end of August and he was lost to follow-up. he has been quite debilitated laying in bed most of the time and his condition has deteriorated significantly. He has multiple ulcerations on the heel lateral forefoot and some of his toes. ======== Old notes: 75 year old male known to our practice when he was seen here in February and March and was lost to follow-up when he was admitted to hospital with various medical problems including coronary artery disease and a stroke. Now returns with the problem on the left forefoot where he has an ulceration and this has been there for about 6 months. most recently he was in hospital between July 6 and July 16, when he was admitted and treated for acute respiratory failure is secondary to aspiration pneumonia, large non-STEMI, ischemic cardiomyopathy with systolic and diastolic congestive heart failure with ejection fraction about 15-20%, ventricular tachycardia and has been treated with amiodarone, acute on careful up at the common new acute CVA, acute chronic kidney  disease stage III, anemia, uncontrolled diabetes mellitus with last hemoglobin A1c being 12%. He has had persistent hyperglycemia given recently. Patient has a past medical history of diabetes mellitus, hypertension, combined systolic and diastolic heart failure, peripheral neuropathy, gout, cardiomyopathy with ejection fraction of about 10-15%,  wound bed as instructed Secondary Dressing: Woven Gauze Sponge, Non-Sterile 4x4 in [facility]) (Generic) Every Other Day/15 Days Discharge Instructions: Apply over primary dressing as directed. Com pression Wrap: Kerlix Roll 4.5x3.1 (in/yd) (Home Health) (Generic) Every Other Day/15 Days Discharge Instructions: Apply Kerlix and Coban compression as directed. Com pression Wrap: Coban Self-Adherent Wrap 4x5 (in/yd) (Home Health) (Generic) Every Other Day/15 Days Discharge Instructions: Apply over Kerlix as directed. WOUND #42: - Lower Leg Wound Laterality: Right, Anterior, Proximal Cleanser: Wound Cleanser (Home Health) (Generic) Every Other Day/15 Days Discharge Instructions: Cleanse the wound with wound cleanser prior to applying a clean dressing using gauze sponges, not tissue or cotton balls. Cleanser: Soap and Water [facility]) Every Other Day/15 Days Discharge Instructions: May shower and wash wound with dial antibacterial soap and water prior to dressing change. Peri-Wound Care: Triamcinolone 15 (g) (Home Health) Every Other Day/15 Days Discharge Instructions: Use  triamcinolone 15 (g) as directed Peri-Wound Care: Sween Lotion (Moisturizing lotion) (Home Health) Every Other Day/15 Days Discharge Instructions: Apply moisturizing lotion as directed Prim Dressing: PolyMem Silver Non-Adhesive Dressing, 4.25x4.25 in Every Other Day/15 Days ary Discharge Instructions: Apply to wound bed as instructed Secondary Dressing: Woven Gauze Sponge, Non-Sterile 4x4 in (Home Health) (Generic) Every Other Day/15 Days Discharge Instructions: Apply over primary dressing as directed. Com pression Wrap: Kerlix Roll 4.5x3.1 (in/yd) (Home Health) (Generic) Every Other Day/15 Days Discharge Instructions: Apply Kerlix and Coban compression as directed. Com pression Wrap: Coban Self-Adherent Wrap 4x5 (in/yd) (Home Health) (Generic) Every Other Day/15 Days Discharge Instructions: Apply over Kerlix as directed. 1. I am going to use polymen Ag on all of these wounds. Again the issue on the heel is absorption of debris and debridement. 2. Vascular I think is important here his appointment is on the 25th now. These look ischemic Electronic Signature(s) Signed: 01/25/2021 10:14:24 AM By: Linton Ham MD Entered By: Linton Ham on 01/24/2021 11:20:39 -------------------------------------------------------------------------------- SuperBill Details Patient Name: Date of Service: [name] [name] F. 01/24/2021 Medical Record Number: [id number] Patient Account Number: [id number] Date of Birth/Sex: Treating RN: February 18, 1946 (76 y.o. 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 S80.211D Abrasion, right knee, subsequent encounter Facility Procedures CPT4 Code: 33545625 Description: 11042 - DEB SUBQ TISSUE 20 SQ CM/< ICD-10 Diagnosis Description L97.418 Non-pressure chronic ulcer of right heel and midfoot with other specified sev Modifier: erity Quantity: 1 Physician Procedures : CPT4 Code Description Modifier [id number] - WC PHYS SUBQ TISS 20 SQ CM ICD-10 Diagnosis Description L97.418 Non-pressure chronic ulcer of right heel and midfoot with other specified severity Quantity: 1 Electronic Signature(s) Signed: 01/25/2021 10:14:24 AM By: Linton Ham MD Entered By: Linton Ham on 01/24/2021 11:20:58  [name] [name] ([id number]) Visit Report for 01/24/2021 Debridement Details Patient Name: Date of Service: [name] [name] [facility] F. 01/24/2021 10:30 A M Medical Record Number: [id number] Patient Account Number: [id number] Date of Birth/Sex: Treating RN: [birth year] (75 y.o. [name] [name] Primary Care Provider: [name], [name] Other Clinician: Referring Provider: Treating Provider/Extender: [name] [name] [name] [name] Weeks in Treatment: 249 Debridement Performed for Assessment: Wound #39 Right Calcaneus Performed By: Physician [name] [name]., MD Debridement Type: Debridement Severity of Tissue Pre Debridement: Fat layer exposed Level of Consciousness (Pre-procedure): Awake and Alert Pre-procedure Verification/Time Out Yes - 11:05 Taken: Start Time: 11:05 T Area Debrided (L x W): otal 1.9 (cm) x 3.4 (cm) = 6.46 (cm) Tissue and other material debrided: Viable, Non-Viable, Eschar, Slough, Subcutaneous, Slough Level: Skin/Subcutaneous Tissue Debridement Description: Excisional Instrument: Curette, Forceps, Scissors Bleeding: Minimum Hemostasis Achieved: Pressure End Time: 11:06 Procedural Pain: 0 Post Procedural Pain: 0 Response to Treatment: Procedure was tolerated well Level of Consciousness (Post- Awake and Alert procedure): Post Debridement Measurements of Total Wound Length: (cm) 1.9 Stage: Unstageable/Unclassified Width: (cm) 3.4 Depth: (cm) 0.5 Volume: (cm) 2.537 Character of Wound/Ulcer Post Debridement: Requires Further Debridement Severity of Tissue Post Debridement: Fat layer exposed Post Procedure Diagnosis Same as Pre-procedure Electronic Signature(s) Signed: 01/24/2021 5:32:19 PM By: Deon Pilling RN, BSN Signed: 01/25/2021 10:14:24 AM By: Linton Ham MD Entered By: Linton Ham on 01/24/2021 11:12:22 -------------------------------------------------------------------------------- HPI Details Patient Name: Date of Service: [name] [name] F.  01/24/2021 10:30 A M Medical Record Number: [id number] Patient Account Number: [id number] Date of Birth/Sex: Treating RN: [birth year] (75 y.o. [name] [name] Primary Care Provider: [name], [name] Other Clinician: Referring Provider: Treating Provider/Extender: Yaniah Thiemann [name] [name] Weeks in Treatment: 249 History of Present Illness Location: On the left and right lateral forefoot which has been there for about 6 months Quality: Patient reports No Pain. Severity: Patient states wound(s) are getting worse. Duration: Patient has had the wound for > 6 months prior to seeking treatment at the wound center Context: The wound would happen gradually Modifying Factors: Patient wound(s)/ulcer(s) are worsening due to :continual drainage from the wound ssociated Signs and Symptoms: Patient reports having increase discharge. A HPI Description: This patient returns after being seen here till the end of August and he was lost to follow-up. he has been quite debilitated laying in bed most of the time and his condition has deteriorated significantly. He has multiple ulcerations on the heel lateral forefoot and some of his toes. ======== Old notes: 75 year old male known to our practice when he was seen here in February and March and was lost to follow-up when he was admitted to hospital with various medical problems including coronary artery disease and a stroke. Now returns with the problem on the left forefoot where he has an ulceration and this has been there for about 6 months. most recently he was in hospital between July 6 and July 16, when he was admitted and treated for acute respiratory failure is secondary to aspiration pneumonia, large non-STEMI, ischemic cardiomyopathy with systolic and diastolic congestive heart failure with ejection fraction about 15-20%, ventricular tachycardia and has been treated with amiodarone, acute on careful up at the common new acute CVA, acute chronic kidney  disease stage III, anemia, uncontrolled diabetes mellitus with last hemoglobin A1c being 12%. He has had persistent hyperglycemia given recently. Patient has a past medical history of diabetes mellitus, hypertension, combined systolic and diastolic heart failure, peripheral neuropathy, gout, cardiomyopathy with ejection fraction of about 10-15%,  Days Discharge Instructions: Apply Kerlix and Coban compression as directed. Compression Wrap: Coban Self-Adherent Wrap 4x5 (in/yd) (Generic) Every Other Day/15 Days Discharge Instructions: Apply over Kerlix as directed. Wound #42 - Lower Leg Wound Laterality: Right, Anterior, Proximal Cleanser: Wound Cleanser (Generic) Every Other Day/15 Days Discharge Instructions: Cleanse the wound with wound cleanser prior to applying a clean dressing using gauze sponges, not tissue or cotton balls. Cleanser: Soap and Water Every Other Day/15 Days Discharge Instructions: May shower and wash wound with  dial antibacterial soap and water prior to dressing change. Peri-Wound Care: Triamcinolone 15 (g) Every Other Day/15 Days Discharge Instructions: Use triamcinolone 15 (g) as directed Peri-Wound Care: Sween Lotion (Moisturizing lotion) Every Other Day/15 Days Discharge Instructions: Apply moisturizing lotion as directed Prim Dressing: PolyMem Silver Non-Adhesive Dressing, 4.25x4.25 in Every Other Day/15 Days ary Discharge Instructions: Apply to wound bed as instructed Secondary Dressing: Woven Gauze Sponge, Non-Sterile 4x4 in (Generic) Every Other Day/15 Days Discharge Instructions: Apply over primary dressing as directed. Compression Wrap: Kerlix Roll 4.5x3.1 (in/yd) (Generic) Every Other Day/15 Days Discharge Instructions: Apply Kerlix and Coban compression as directed. Compression Wrap: Coban Self-Adherent Wrap 4x5 (in/yd) (Generic) Every Other Day/15 Days Discharge Instructions: Apply over Kerlix as directed. Radiology X-ray, right heel - Non healing wound on right heel - (ICD10 E11.621 - Type 2 diabetes mellitus with foot ulcer) Electronic Signature(s) Signed: 01/24/2021 5:33:33 PM By: Levan Hurst RN, BSN Signed: 01/25/2021 10:14:24 AM By: Linton Ham MD Entered By: Levan Hurst on 01/24/2021 12:04:03 Prescription 01/24/2021 -------------------------------------------------------------------------------- [name] MD Patient Name: Provider: 1945/04/26 [id number] Date of Birth: NPI#: [name] [id number] Sex: DEA #: [phone] [id number] Phone #: License #: [place] Patient Address: [address] [place], [place] [address] [place], [place] [address] [address] Provider's Orders X-ray, right heel - ICD10: E11.621 - Non healing wound on right heel Hand Signature: Date(s): Electronic Signature(s) Signed: 01/24/2021 5:33:33 PM By: Levan Hurst RN, BSN Signed:  01/25/2021 10:14:24 AM By: Linton Ham MD Entered By: Levan Hurst on 01/24/2021 12:04:05 -------------------------------------------------------------------------------- Problem List Details Patient Name: Date of Service: [name] [name] F. 01/24/2021 10:30 A M Medical Record Number: [id number] Patient Account Number: [id number] Date of Birth/Sex: Treating RN: May 18, 1945 (75 y.o. [name] [name] Primary Care Provider: [name], [name] Other Clinician: Referring Provider: Treating Provider/Extender: Terissa Haffey [name] [name] Weeks in Treatment: 249 Active Problems ICD-10 Encounter Code Description Active Date MDM Diagnosis E11.621 Type 2 diabetes mellitus with foot ulcer 04/17/2016 No Yes L97.518 Non-pressure chronic ulcer of other part of right foot with other specified 05/17/2020 No Yes severity L97.218 Non-pressure chronic ulcer of right calf with other specified severity 11/01/2020 No Yes S80.211D Abrasion, right knee, subsequent encounter 11/01/2020 No Yes 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 Inactive Date L89.613 Pressure ulcer of right heel, stage 3 04/17/2016 04/17/2016 L97.221 Non-pressure chronic ulcer of left calf limited to breakdown of skin 08/15/2017 08/15/2017 L97.321 Non-pressure chronic ulcer of left ankle limited to breakdown of skin 07/01/2018 07/01/2018 L03.032 Cellulitis of left toe 01/06/2019 01/06/2019 Y07.371 Pressure ulcer of left heel, stage 3 12/04/2016 12/04/2016 I25.119 Atherosclerotic heart disease of native coronary artery with unspecified angina 04/17/2016 04/17/2016 pectoris L97.821 Non-pressure chronic ulcer of other part of left lower leg limited to breakdown of skin 01/06/2019 01/06/2019 I50.31  wound bed as instructed Secondary Dressing: Woven Gauze Sponge, Non-Sterile 4x4 in [facility]) (Generic) Every Other Day/15 Days Discharge Instructions: Apply over primary dressing as directed. Com pression Wrap: Kerlix Roll 4.5x3.1 (in/yd) (Home Health) (Generic) Every Other Day/15 Days Discharge Instructions: Apply Kerlix and Coban compression as directed. Com pression Wrap: Coban Self-Adherent Wrap 4x5 (in/yd) (Home Health) (Generic) Every Other Day/15 Days Discharge Instructions: Apply over Kerlix as directed. WOUND #42: - Lower Leg Wound Laterality: Right, Anterior, Proximal Cleanser: Wound Cleanser (Home Health) (Generic) Every Other Day/15 Days Discharge Instructions: Cleanse the wound with wound cleanser prior to applying a clean dressing using gauze sponges, not tissue or cotton balls. Cleanser: Soap and Water [facility]) Every Other Day/15 Days Discharge Instructions: May shower and wash wound with dial antibacterial soap and water prior to dressing change. Peri-Wound Care: Triamcinolone 15 (g) (Home Health) Every Other Day/15 Days Discharge Instructions: Use  triamcinolone 15 (g) as directed Peri-Wound Care: Sween Lotion (Moisturizing lotion) (Home Health) Every Other Day/15 Days Discharge Instructions: Apply moisturizing lotion as directed Prim Dressing: PolyMem Silver Non-Adhesive Dressing, 4.25x4.25 in Every Other Day/15 Days ary Discharge Instructions: Apply to wound bed as instructed Secondary Dressing: Woven Gauze Sponge, Non-Sterile 4x4 in (Home Health) (Generic) Every Other Day/15 Days Discharge Instructions: Apply over primary dressing as directed. Com pression Wrap: Kerlix Roll 4.5x3.1 (in/yd) (Home Health) (Generic) Every Other Day/15 Days Discharge Instructions: Apply Kerlix and Coban compression as directed. Com pression Wrap: Coban Self-Adherent Wrap 4x5 (in/yd) (Home Health) (Generic) Every Other Day/15 Days Discharge Instructions: Apply over Kerlix as directed. 1. I am going to use polymen Ag on all of these wounds. Again the issue on the heel is absorption of debris and debridement. 2. Vascular I think is important here his appointment is on the 25th now. These look ischemic Electronic Signature(s) Signed: 01/25/2021 10:14:24 AM By: Linton Ham MD Entered By: Linton Ham on 01/24/2021 11:20:39 -------------------------------------------------------------------------------- SuperBill Details Patient Name: Date of Service: [name] [name] F. 01/24/2021 Medical Record Number: [id number] Patient Account Number: [id number] Date of Birth/Sex: Treating RN: February 18, 1946 (76 y.o. 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 S80.211D Abrasion, right knee, subsequent encounter Facility Procedures CPT4 Code: [id number] Description: 11042 - DEB SUBQ TISSUE 20 SQ CM/< ICD-10 Diagnosis Description L97.418 Non-pressure chronic ulcer of right heel and midfoot with other specified sev Modifier: erity Quantity: 1 Physician Procedures : CPT4 Code Description Modifier [id number] - WC PHYS SUBQ TISS 20 SQ CM ICD-10 Diagnosis Description L97.418 Non-pressure chronic ulcer of right heel and midfoot with other specified severity Quantity: 1 Electronic Signature(s) Signed: 01/25/2021 10:14:24 AM By: Linton Ham MD Entered By: Linton Ham on 01/24/2021 11:20:58

## 2021-01-29 ENCOUNTER — Other Ambulatory Visit: Payer: Self-pay

## 2021-01-29 DIAGNOSIS — I739 Peripheral vascular disease, unspecified: Secondary | ICD-10-CM

## 2021-02-07 ENCOUNTER — Encounter (HOSPITAL_BASED_OUTPATIENT_CLINIC_OR_DEPARTMENT_OTHER): Payer: Medicare HMO | Admitting: Internal Medicine

## 2021-02-07 ENCOUNTER — Other Ambulatory Visit: Payer: Self-pay

## 2021-02-07 ENCOUNTER — Other Ambulatory Visit (HOSPITAL_COMMUNITY): Payer: Self-pay | Admitting: Internal Medicine

## 2021-02-07 ENCOUNTER — Ambulatory Visit (HOSPITAL_COMMUNITY)
Admission: RE | Admit: 2021-02-07 | Discharge: 2021-02-07 | Disposition: A | Discharge status: Home / Self Care | Payer: Medicare HMO | Source: Ambulatory Visit | Attending: Internal Medicine | Admitting: Internal Medicine

## 2021-02-07 DIAGNOSIS — L97218 Non-pressure chronic ulcer of right calf with other specified severity: Secondary | ICD-10-CM | POA: Diagnosis not present

## 2021-02-07 DIAGNOSIS — L97419 Non-pressure chronic ulcer of right heel and midfoot with unspecified severity: Secondary | ICD-10-CM

## 2021-02-07 DIAGNOSIS — E13621 Other specified diabetes mellitus with foot ulcer: Secondary | ICD-10-CM | POA: Insufficient documentation

## 2021-02-07 DIAGNOSIS — E11621 Type 2 diabetes mellitus with foot ulcer: Secondary | ICD-10-CM | POA: Diagnosis not present

## 2021-02-07 DIAGNOSIS — S80211A Abrasion, right knee, initial encounter: Secondary | ICD-10-CM | POA: Diagnosis not present

## 2021-02-07 DIAGNOSIS — I130 Hypertensive heart and chronic kidney disease with heart failure and stage 1 through stage 4 chronic kidney disease, or unspecified chronic kidney disease: Secondary | ICD-10-CM | POA: Diagnosis not present

## 2021-02-07 DIAGNOSIS — R5381 Other malaise: Secondary | ICD-10-CM | POA: Diagnosis not present

## 2021-02-07 DIAGNOSIS — E1122 Type 2 diabetes mellitus with diabetic chronic kidney disease: Secondary | ICD-10-CM | POA: Diagnosis not present

## 2021-02-07 DIAGNOSIS — R600 Localized edema: Secondary | ICD-10-CM | POA: Diagnosis not present

## 2021-02-07 DIAGNOSIS — L89613 Pressure ulcer of right heel, stage 3: Secondary | ICD-10-CM | POA: Diagnosis not present

## 2021-02-07 DIAGNOSIS — N183 Chronic kidney disease, stage 3 unspecified: Secondary | ICD-10-CM | POA: Diagnosis not present

## 2021-02-07 DIAGNOSIS — L97418 Non-pressure chronic ulcer of right heel and midfoot with other specified severity: Secondary | ICD-10-CM | POA: Diagnosis not present

## 2021-02-07 DIAGNOSIS — L97518 Non-pressure chronic ulcer of other part of right foot with other specified severity: Secondary | ICD-10-CM | POA: Diagnosis not present

## 2021-02-07 NOTE — Progress Notes (Signed)
a clean dressing using gauze sponges, not tissue or cotton balls. Soap and Water Discharge Instruction: May shower and wash wound with dial antibacterial soap and water prior to dressing change. Peri-Wound Care Triamcinolone 15 (g) Discharge Instruction: Use triamcinolone 15 (g) as directed Sween Lotion (Moisturizing lotion) Discharge Instruction: Apply moisturizing lotion as directed Topical Primary Dressing PolyMem Silver  Non-Adhesive Dressing, 4.25x4.25 in Discharge Instruction: Apply to wound bed as instructed Secondary Dressing Woven Gauze Sponge, Non-Sterile 4x4 in Discharge Instruction: Apply over primary dressing as directed. Secured With Compression Wrap Kerlix Roll 4.5x3.1 (in/yd) Discharge Instruction: Apply Kerlix and Coban compression as directed. Coban Self-Adherent Wrap 4x5 (in/yd) Discharge Instruction: Apply over Kerlix as directed. Compression Stockings Add-Ons Electronic Signature(s) Signed: 02/07/2021 4:32:39 PM By: Lorrin Jackson Entered By: Lorrin Jackson on 02/07/2021 11:27:21 -------------------------------------------------------------------------------- Vitals Details Patient Name: Date of Service: [name] [name] F. 02/07/2021 10:45 A M Medical Record Number: [id number] Patient Account Number: [id number] Date of Birth/Sex: Treating RN: [birth year] (75 y.o. [name] [name] Primary Care Kristine Tiley: [name], [name] Other Clinician: Referring Koran Seabrook: Treating Joeseph Verville/Extender: [name], [name] [name], [name] Weeks in Treatment: 251 Vital Signs Time Taken: 11:00 Temperature (F): 98.3 Height (in): 74 Pulse (bpm): 67 Weight (lbs): 150 Respiratory Rate (breaths/min): 16 Body Mass Index (BMI): 19.3 Blood Pressure (mmHg): 146/78 Capillary Blood Glucose (mg/dl): 118 Reference Range: 80 - 120 mg / dl Electronic Signature(s) Signed: 02/07/2021 4:32:39 PM By: Lorrin Jackson Entered By: Lorrin Jackson on 02/07/2021 11:00:43  a clean dressing using gauze sponges, not tissue or cotton balls. Soap and Water Discharge Instruction: May shower and wash wound with dial antibacterial soap and water prior to dressing change. Peri-Wound Care Triamcinolone 15 (g) Discharge Instruction: Use triamcinolone 15 (g) as directed Sween Lotion (Moisturizing lotion) Discharge Instruction: Apply moisturizing lotion as directed Topical Primary Dressing PolyMem Silver  Non-Adhesive Dressing, 4.25x4.25 in Discharge Instruction: Apply to wound bed as instructed Secondary Dressing Woven Gauze Sponge, Non-Sterile 4x4 in Discharge Instruction: Apply over primary dressing as directed. Secured With Compression Wrap Kerlix Roll 4.5x3.1 (in/yd) Discharge Instruction: Apply Kerlix and Coban compression as directed. Coban Self-Adherent Wrap 4x5 (in/yd) Discharge Instruction: Apply over Kerlix as directed. Compression Stockings Add-Ons Electronic Signature(s) Signed: 02/07/2021 4:32:39 PM By: Lorrin Jackson Entered By: Lorrin Jackson on 02/07/2021 11:27:21 -------------------------------------------------------------------------------- Vitals Details Patient Name: Date of Service: [name] [name] F. 02/07/2021 10:45 A M Medical Record Number: [id number] Patient Account Number: [id number] Date of Birth/Sex: Treating RN: [birth year] (75 y.o. [name] [name] Primary Care Kristine Tiley: [name], [name] Other Clinician: Referring Koran Seabrook: Treating Joeseph Verville/Extender: [name], [name] [name], [name] Weeks in Treatment: 251 Vital Signs Time Taken: 11:00 Temperature (F): 98.3 Height (in): 74 Pulse (bpm): 67 Weight (lbs): 150 Respiratory Rate (breaths/min): 16 Body Mass Index (BMI): 19.3 Blood Pressure (mmHg): 146/78 Capillary Blood Glucose (mg/dl): 118 Reference Range: 80 - 120 mg / dl Electronic Signature(s) Signed: 02/07/2021 4:32:39 PM By: Lorrin Jackson Entered By: Lorrin Jackson on 02/07/2021 11:00:43  change. Peri-Wound Care Triamcinolone 15 (g) Discharge Instruction: Use triamcinolone 15 (g) as directed Sween Lotion (Moisturizing lotion) Discharge Instruction: Apply moisturizing lotion as directed Topical Primary Dressing PolyMem Silver Non-Adhesive Dressing, 4.25x4.25 in Discharge Instruction: Apply to wound bed as instructed Secondary Dressing Woven Gauze Sponge, Non-Sterile 4x4 in Discharge Instruction: Apply over primary dressing as directed. Secured With Compression Wrap Kerlix Roll 4.5x3.1 (in/yd) Discharge Instruction: Apply Kerlix and Coban compression as directed. Coban Self-Adherent Wrap 4x5 (in/yd) Discharge Instruction: Apply over Kerlix as directed. Compression Stockings Add-Ons Wound #42 (Lower Leg) Wound Laterality: Right, Anterior, Proximal Cleanser Wound Cleanser Discharge Instruction: Cleanse the wound with wound cleanser prior to applying a clean dressing using gauze sponges, not tissue or cotton balls. Soap and Water Discharge Instruction: May shower and wash wound with dial antibacterial soap and water prior to dressing change. Peri-Wound Care Triamcinolone 15 (g) Discharge Instruction: Use triamcinolone 15 (g) as directed Sween Lotion (Moisturizing lotion) Discharge Instruction: Apply moisturizing lotion as directed Topical Primary Dressing PolyMem Silver Non-Adhesive Dressing, 4.25x4.25 in Discharge Instruction: Apply to wound bed as instructed Secondary Dressing Woven Gauze Sponge, Non-Sterile 4x4 in Discharge Instruction: Apply over primary dressing as directed. Secured With Compression Wrap Kerlix Roll 4.5x3.1 (in/yd) Discharge Instruction: Apply Kerlix and Coban compression as directed. Coban Self-Adherent Wrap 4x5 (in/yd) Discharge Instruction:  Apply over Kerlix as directed. Compression Stockings Add-Ons Electronic Signature(s) Signed: 02/07/2021 4:50:06 PM By: [name] Signed: 02/07/2021 4:56:56 PM By: Linton Ham MD Entered By: Linton Ham on 02/07/2021 12:58:26 -------------------------------------------------------------------------------- Multi-Disciplinary Care Plan Details Patient Name: Date of Service: [name] [name] F. 02/07/2021 10:45 A M Medical Record Number: [id number] Patient Account Number: [id number] Date of Birth/Sex: Treating RN: 18-Dec-1945 (75 y.o. [name] [name] Primary Care Darlean Warmoth: [name], [name] Other Clinician: Referring Mira Balon: Treating Seena Face/Extender: [name], [name] [name], [name] Weeks in Treatment: [place] reviewed with physician Active Inactive Wound/Skin Impairment Nursing Diagnoses: Impaired tissue integrity Goals: Patient/caregiver will verbalize understanding of skin care regimen Date Initiated: 11/19/2017 Target Resolution Date: 02/24/2021 Goal Status: Active Ulcer/skin breakdown will have a volume reduction of 30% by week 4 Date Initiated: 11/19/2017 Date Inactivated: 12/02/2017 Target Resolution Date: 12/22/2017 Goal Status: Unmet Unmet Reason: multipl comorbidities Interventions: Assess patient/caregiver ability to obtain necessary supplies Assess ulceration(s) every visit Provide education on ulcer and skin care Notes: Electronic Signature(s) Signed: 02/07/2021 4:32:39 PM By: Lorrin Jackson Entered By: Lorrin Jackson on 02/07/2021 11:32:59 -------------------------------------------------------------------------------- Pain Assessment Details Patient Name: Date of Service: [name] [name] F. 02/07/2021 10:45 A M Medical Record Number: [id number] Patient Account Number: [id number] Date of Birth/Sex: Treating RN: [birth year] (75 y.o. [name] [name] Primary Care Miachel Nardelli: [name], [name] Other Clinician: Referring  Marylou Wages: Treating Zohar Laing/Extender: [name], [name] [name], [name] Weeks in Treatment: 251 Active Problems Location of Pain Severity and Description of Pain Patient Has Paino No Site Locations Pain Management and Medication Current Pain Management: Electronic Signature(s) Signed: 02/07/2021 4:32:39 PM By: Lorrin Jackson Entered By: Lorrin Jackson on 02/07/2021 11:01:09 -------------------------------------------------------------------------------- Patient/Caregiver Education Details Patient Name: Date of Service: [name] 10/18/2022andnbsp10:45 A M Medical Record Number: [id number] Patient Account Number: [id number] Date of Birth/Gender: Treating RN: 05/13/1945 (75 y.o. [name] Primary Care Physician: [name], [name] Other Clinician: Referring Physician: Treating Physician/Extender: [name], [name] [name], [name] Weeks in Treatment: 251 Education Assessment Education Provided To: Patient Education Topics Provided Wound/Skin Impairment: Methods: Explain/Verbal Responses: Reinforcements needed, State content correctly [employer]) Signed: 02/07/2021 5:34:53 PM By: Baruch Gouty RN, BSN Entered By: Baruch Gouty  change. Peri-Wound Care Triamcinolone 15 (g) Discharge Instruction: Use triamcinolone 15 (g) as directed Sween Lotion (Moisturizing lotion) Discharge Instruction: Apply moisturizing lotion as directed Topical Primary Dressing PolyMem Silver Non-Adhesive Dressing, 4.25x4.25 in Discharge Instruction: Apply to wound bed as instructed Secondary Dressing Woven Gauze Sponge, Non-Sterile 4x4 in Discharge Instruction: Apply over primary dressing as directed. Secured With Compression Wrap Kerlix Roll 4.5x3.1 (in/yd) Discharge Instruction: Apply Kerlix and Coban compression as directed. Coban Self-Adherent Wrap 4x5 (in/yd) Discharge Instruction: Apply over Kerlix as directed. Compression Stockings Add-Ons Wound #42 (Lower Leg) Wound Laterality: Right, Anterior, Proximal Cleanser Wound Cleanser Discharge Instruction: Cleanse the wound with wound cleanser prior to applying a clean dressing using gauze sponges, not tissue or cotton balls. Soap and Water Discharge Instruction: May shower and wash wound with dial antibacterial soap and water prior to dressing change. Peri-Wound Care Triamcinolone 15 (g) Discharge Instruction: Use triamcinolone 15 (g) as directed Sween Lotion (Moisturizing lotion) Discharge Instruction: Apply moisturizing lotion as directed Topical Primary Dressing PolyMem Silver Non-Adhesive Dressing, 4.25x4.25 in Discharge Instruction: Apply to wound bed as instructed Secondary Dressing Woven Gauze Sponge, Non-Sterile 4x4 in Discharge Instruction: Apply over primary dressing as directed. Secured With Compression Wrap Kerlix Roll 4.5x3.1 (in/yd) Discharge Instruction: Apply Kerlix and Coban compression as directed. Coban Self-Adherent Wrap 4x5 (in/yd) Discharge Instruction:  Apply over Kerlix as directed. Compression Stockings Add-Ons Electronic Signature(s) Signed: 02/07/2021 4:50:06 PM By: [name] Signed: 02/07/2021 4:56:56 PM By: Linton Ham MD Entered By: Linton Ham on 02/07/2021 12:58:26 -------------------------------------------------------------------------------- Multi-Disciplinary Care Plan Details Patient Name: Date of Service: [name] [name] F. 02/07/2021 10:45 A M Medical Record Number: [id number] Patient Account Number: [id number] Date of Birth/Sex: Treating RN: 18-Dec-1945 (75 y.o. [name] [name] Primary Care Darlean Warmoth: [name], [name] Other Clinician: Referring Mira Balon: Treating Seena Face/Extender: [name], [name] [name], [name] Weeks in Treatment: [place] reviewed with physician Active Inactive Wound/Skin Impairment Nursing Diagnoses: Impaired tissue integrity Goals: Patient/caregiver will verbalize understanding of skin care regimen Date Initiated: 11/19/2017 Target Resolution Date: 02/24/2021 Goal Status: Active Ulcer/skin breakdown will have a volume reduction of 30% by week 4 Date Initiated: 11/19/2017 Date Inactivated: 12/02/2017 Target Resolution Date: 12/22/2017 Goal Status: Unmet Unmet Reason: multipl comorbidities Interventions: Assess patient/caregiver ability to obtain necessary supplies Assess ulceration(s) every visit Provide education on ulcer and skin care Notes: Electronic Signature(s) Signed: 02/07/2021 4:32:39 PM By: Lorrin Jackson Entered By: Lorrin Jackson on 02/07/2021 11:32:59 -------------------------------------------------------------------------------- Pain Assessment Details Patient Name: Date of Service: [name] [name] F. 02/07/2021 10:45 A M Medical Record Number: [id number] Patient Account Number: [id number] Date of Birth/Sex: Treating RN: [birth year] (75 y.o. [name] [name] Primary Care Miachel Nardelli: [name], [name] Other Clinician: Referring  Marylou Wages: Treating Zohar Laing/Extender: [name], [name] [name], [name] Weeks in Treatment: 251 Active Problems Location of Pain Severity and Description of Pain Patient Has Paino No Site Locations Pain Management and Medication Current Pain Management: Electronic Signature(s) Signed: 02/07/2021 4:32:39 PM By: Lorrin Jackson Entered By: Lorrin Jackson on 02/07/2021 11:01:09 -------------------------------------------------------------------------------- Patient/Caregiver Education Details Patient Name: Date of Service: [name] 10/18/2022andnbsp10:45 A M Medical Record Number: [id number] Patient Account Number: [id number] Date of Birth/Gender: Treating RN: 05/13/1945 (75 y.o. [name] Primary Care Physician: [name], [name] Other Clinician: Referring Physician: Treating Physician/Extender: [name], [name] [name], [name] Weeks in Treatment: 251 Education Assessment Education Provided To: Patient Education Topics Provided Wound/Skin Impairment: Methods: Explain/Verbal Responses: Reinforcements needed, State content correctly [employer]) Signed: 02/07/2021 5:34:53 PM By: Baruch Gouty RN, BSN Entered By: Baruch Gouty  CYRIC, WILFERT ([id number]) Visit Report for 02/07/2021 Arrival Information Details Patient Name: Date of Service: [name] [facility] F. 02/07/2021 10:45 A M Medical Record Number: [id number] Patient Account Number: [id number] Date of Birth/Sex: Treating RN: [birth year] (75 y.o. [name] [name] Primary Care Jayke Caul: [name], [name] Other Clinician: Referring Kalandra Masters: Treating Little Winton/Extender: [name], [name] [name], [name] Weeks in Treatment: 78 Visit Information History Since Last Visit All ordered tests and consults were completed: Yes Patient Arrived: Wheel Chair Added or deleted any medications: No Arrival Time: 10:53 Any new allergies or adverse reactions: No Transfer Assistance: Manual Had a fall or experienced change in No Patient Identification Verified: Yes activities of daily living that may affect Secondary Verification Process Completed: Yes risk of falls: Patient Requires Transmission-Based Precautions: No Signs or symptoms of abuse/neglect since last visito No Patient Has Alerts: Yes Hospitalized since last visit: No Patient Alerts: Patient on Blood Thinner Implantable device outside of the clinic excluding No left ABI 1.0; rt ABI 1.09 cellular tissue based products placed in the center since last visit: Has Dressing in Place as Prescribed: Yes Pain Present Now: No Electronic Signature(s) Signed: 02/07/2021 4:32:39 PM By: Lorrin Jackson Entered By: Lorrin Jackson on 02/07/2021 10:59:53 -------------------------------------------------------------------------------- Encounter Discharge Information Details Patient Name: Date of Service: [name] [name] F. 02/07/2021 10:45 A M Medical Record Number: [id number] Patient Account Number: [id number] Date of Birth/Sex: Treating RN: 1945/07/01 (75 y.o. [name] Primary Care Roanne Haye: [name], [name] Other Clinician: Referring Pearlie Nies: Treating Kenzley Ke/Extender: [name], [name] [name], [name] Weeks in  Treatment: 251 Encounter Discharge Information Items Post Procedure Vitals Discharge Condition: Stable Temperature (F): 98.3 Ambulatory Status: Wheelchair Pulse (bpm): 67 Discharge Destination: Home Respiratory Rate (breaths/min): 18 Transportation: Private Auto Blood Pressure (mmHg): 146/78 Accompanied By: daughter Schedule Follow-up Appointment: Yes Clinical Summary of Care: Patient Declined Electronic Signature(s) Signed: 02/07/2021 5:34:53 PM By: Baruch Gouty RN, BSN Entered By: Baruch Gouty on 02/07/2021 12:54:01 -------------------------------------------------------------------------------- Lower Extremity Assessment Details Patient Name: Date of Service: [name] [name] F. 02/07/2021 10:45 A M Medical Record Number: [id number] Patient Account Number: [id number] Date of Birth/Sex: Treating RN: 24-Feb-1946 (75 y.o. [name] [name] Primary Care Ludmilla Mcgillis: [name], [name] Other Clinician: Referring Axell Trigueros: Treating Rondal Vandevelde/Extender: [name], [name] [name], [name] Weeks in Treatment: 251 Edema Assessment Assessed: Shirlyn Goltz: No] Patrice Paradise: No] Edema: [Left: Ye] [Right: s] Calf Left: Right: Point of Measurement: 36 cm From Medial Instep 26 cm Ankle Left: Right: Point of Measurement: 11 cm From Medial Instep 21.5 cm Vascular Assessment Pulses: Dorsalis Pedis Palpable: [Right:No] Electronic Signature(s) Signed: 02/07/2021 4:32:39 PM By: Lorrin Jackson Entered By: Lorrin Jackson on 02/07/2021 11:10:00 -------------------------------------------------------------------------------- Multi Wound Chart Details Patient Name: Date of Service: [name] [name] F. 02/07/2021 10:45 A M Medical Record Number: [id number] Patient Account Number: [id number] Date of Birth/Sex: Treating RN: 10-12-1945 (75 y.o. [name], [name] Primary Care Kaylib Furness: [name], [name] Other Clinician: Referring Garin Mata: Treating Zacheriah Stumpe/Extender: [name], [name] [name], [name] Weeks in  Treatment: 251 Vital Signs Height(in): 74 Capillary Blood Glucose(mg/dl): 118 Weight(lbs): 150 Pulse(bpm): 28 Body Mass Index(BMI): 19 Blood Pressure(mmHg): 146/78 Temperature(F): 98.3 Respiratory Rate(breaths/min): 16 Photos: [25:No Photos Right, Lateral Lower Leg] [34:No Photos Right, Medial, Dorsal Foot] [39:No Photos Right Calcaneus] Wound Location: [25:Gradually Appeared] [34:Gradually Appeared] [39:Pressure Injury] Wounding Event: [25:Venous Leg Ulcer] [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 Artery Disease, Hypertension, Myocardial Infarction,  Fascia Exposed: No Necrotic Amount: Medium (34-66%) Fat Layer (Subcutaneous Tissue) Exposed: Yes Necrotic Quality: Adherent Slough Tendon Exposed: Yes Muscle Exposed: No Joint Exposed: No Bone Exposed: Yes Treatment Notes Wound #34 (Foot) Wound Laterality: Dorsal, Right, Medial Cleanser Wound Cleanser Discharge Instruction: Cleanse the wound with wound cleanser prior to applying a clean dressing using gauze sponges, not tissue or cotton balls. Soap and Water Discharge Instruction: May shower and wash wound with dial antibacterial soap and water prior to dressing change. Peri-Wound Care Triamcinolone 15 (g) Discharge Instruction: Use triamcinolone 15 (g) as directed Sween Lotion (Moisturizing lotion) Discharge Instruction: Apply moisturizing lotion as directed Topical Primary Dressing PolyMem Silver Non-Adhesive Dressing, 4.25x4.25 in Discharge Instruction: Apply to wound bed as instructed Secondary Dressing Woven Gauze Sponge, Non-Sterile 4x4 in Discharge Instruction: Apply over primary dressing as directed. ABD Pad, 5x9 Discharge Instruction: Apply over primary dressing as directed. Secured With Compression Wrap Kerlix Roll 4.5x3.1 (in/yd) Discharge Instruction: Apply Kerlix and Coban compression as directed. Coban Self-Adherent Wrap 4x5 (in/yd) Discharge Instruction: Apply over Kerlix as directed. Compression Stockings Add-Ons Electronic Signature(s) Signed: 02/07/2021 4:32:39 PM By: Lorrin Jackson Entered By: Lorrin Jackson on 02/07/2021 11:22:26 -------------------------------------------------------------------------------- Wound Assessment Details Patient Name: Date of Service: [name] [name] F. 02/07/2021 10:45 A M Medical Record Number: [id number] Patient Account Number: [id number] Date of Birth/Sex: Treating RN: [birth year] (75 y.o. [name] [name] Primary Care Cinsere Mizrahi: [name], [name] Other Clinician: Referring Accalia Rigdon: Treating  Milton Streicher/Extender: [name], [name] [name], [name] Weeks in Treatment: 251 Wound Status Wound Number: 39 Primary Pressure Ulcer Etiology: Wound Location: Right Calcaneus Secondary Arterial Insufficiency Ulcer Wounding Event: Pressure Injury Etiology: Date Acquired: 11/15/2020 Wound Open Weeks Of Treatment: 12 Status: Clustered Wound: No Comorbid Anemia, Sleep Apnea, Arrhythmia, Congestive Heart Failure, History: Coronary Artery Disease, Hypertension, Myocardial Infarction, Type II Diabetes, Gout, Neuropathy Wound Measurements Length: (cm) 1.9 Width: (cm) 3.8 Depth: (cm) 0.2 Area: (cm) 5.671 Volume: (cm) 1.134 % Reduction in Area: -122.8% % Reduction in Volume: -346.5% Epithelialization: None Tunneling: No Undermining: No Wound Description Classification: Category/Stage III Wound Margin: Distinct, outline attached Exudate Amount: Medium Exudate Type: Purulent Exudate Color: yellow, brown, green Foul Odor After Cleansing: No Slough/Fibrino Yes Wound Bed Granulation Amount: Small (1-33%) Exposed Structure Granulation Quality: Pink, Pale Fascia Exposed: No Necrotic Amount: Large (67-100%) Fat Layer (Subcutaneous Tissue) Exposed: Yes Necrotic Quality: Eschar, Adherent Slough Tendon Exposed: No Muscle Exposed: No Joint Exposed: No Bone Exposed: No Treatment Notes Wound #39 (Calcaneus) Wound Laterality: Right Cleanser Wound Cleanser Discharge Instruction: Cleanse the wound with wound cleanser prior to applying a clean dressing using gauze sponges, not tissue or cotton balls. Soap and Water Discharge Instruction: May shower and wash wound with dial antibacterial soap and water prior to dressing change. Peri-Wound Care Triamcinolone 15 (g) Discharge Instruction: Use triamcinolone 15 (g) as directed Sween Lotion (Moisturizing lotion) Discharge Instruction: Apply moisturizing lotion as directed Topical Primary Dressing PolyMem Silver Non-Adhesive Dressing, 4.25x4.25  in Discharge Instruction: Apply to wound bed as instructed Secondary Dressing Woven Gauze Sponge, Non-Sterile 4x4 in Discharge Instruction: Apply over primary dressing as directed. ALLEVYN Heel 4 1/2in x 5 1/2in / 10.5cm x 13.5cm Discharge Instruction: Apply over primary dressing as directed. Secured With Compression Wrap Kerlix Roll 4.5x3.1 (in/yd) Discharge Instruction: Apply Kerlix and Coban compression as directed. Coban Self-Adherent Wrap 4x5 (in/yd) Discharge Instruction: Apply over Kerlix as directed. Compression Stockings Add-Ons Electronic Signature(s) Signed: 02/07/2021 4:32:39 PM By: Lorrin Jackson Entered By: Lorrin Jackson on 02/07/2021 11:24:31 -------------------------------------------------------------------------------- Wound Assessment Details Patient Name: Date  Fascia Exposed: No Necrotic Amount: Medium (34-66%) Fat Layer (Subcutaneous Tissue) Exposed: Yes Necrotic Quality: Adherent Slough Tendon Exposed: Yes Muscle Exposed: No Joint Exposed: No Bone Exposed: Yes Treatment Notes Wound #34 (Foot) Wound Laterality: Dorsal, Right, Medial Cleanser Wound Cleanser Discharge Instruction: Cleanse the wound with wound cleanser prior to applying a clean dressing using gauze sponges, not tissue or cotton balls. Soap and Water Discharge Instruction: May shower and wash wound with dial antibacterial soap and water prior to dressing change. Peri-Wound Care Triamcinolone 15 (g) Discharge Instruction: Use triamcinolone 15 (g) as directed Sween Lotion (Moisturizing lotion) Discharge Instruction: Apply moisturizing lotion as directed Topical Primary Dressing PolyMem Silver Non-Adhesive Dressing, 4.25x4.25 in Discharge Instruction: Apply to wound bed as instructed Secondary Dressing Woven Gauze Sponge, Non-Sterile 4x4 in Discharge Instruction: Apply over primary dressing as directed. ABD Pad, 5x9 Discharge Instruction: Apply over primary dressing as directed. Secured With Compression Wrap Kerlix Roll 4.5x3.1 (in/yd) Discharge Instruction: Apply Kerlix and Coban compression as directed. Coban Self-Adherent Wrap 4x5 (in/yd) Discharge Instruction: Apply over Kerlix as directed. Compression Stockings Add-Ons Electronic Signature(s) Signed: 02/07/2021 4:32:39 PM By: Lorrin Jackson Entered By: Lorrin Jackson on 02/07/2021 11:22:26 -------------------------------------------------------------------------------- Wound Assessment Details Patient Name: Date of Service: [name] [name] F. 02/07/2021 10:45 A M Medical Record Number: [id number] Patient Account Number: [id number] Date of Birth/Sex: Treating RN: [birth year] (75 y.o. [name] [name] Primary Care Cinsere Mizrahi: [name], [name] Other Clinician: Referring Accalia Rigdon: Treating  Milton Streicher/Extender: [name], [name] [name], [name] Weeks in Treatment: 251 Wound Status Wound Number: 39 Primary Pressure Ulcer Etiology: Wound Location: Right Calcaneus Secondary Arterial Insufficiency Ulcer Wounding Event: Pressure Injury Etiology: Date Acquired: 11/15/2020 Wound Open Weeks Of Treatment: 12 Status: Clustered Wound: No Comorbid Anemia, Sleep Apnea, Arrhythmia, Congestive Heart Failure, History: Coronary Artery Disease, Hypertension, Myocardial Infarction, Type II Diabetes, Gout, Neuropathy Wound Measurements Length: (cm) 1.9 Width: (cm) 3.8 Depth: (cm) 0.2 Area: (cm) 5.671 Volume: (cm) 1.134 % Reduction in Area: -122.8% % Reduction in Volume: -346.5% Epithelialization: None Tunneling: No Undermining: No Wound Description Classification: Category/Stage III Wound Margin: Distinct, outline attached Exudate Amount: Medium Exudate Type: Purulent Exudate Color: yellow, brown, green Foul Odor After Cleansing: No Slough/Fibrino Yes Wound Bed Granulation Amount: Small (1-33%) Exposed Structure Granulation Quality: Pink, Pale Fascia Exposed: No Necrotic Amount: Large (67-100%) Fat Layer (Subcutaneous Tissue) Exposed: Yes Necrotic Quality: Eschar, Adherent Slough Tendon Exposed: No Muscle Exposed: No Joint Exposed: No Bone Exposed: No Treatment Notes Wound #39 (Calcaneus) Wound Laterality: Right Cleanser Wound Cleanser Discharge Instruction: Cleanse the wound with wound cleanser prior to applying a clean dressing using gauze sponges, not tissue or cotton balls. Soap and Water Discharge Instruction: May shower and wash wound with dial antibacterial soap and water prior to dressing change. Peri-Wound Care Triamcinolone 15 (g) Discharge Instruction: Use triamcinolone 15 (g) as directed Sween Lotion (Moisturizing lotion) Discharge Instruction: Apply moisturizing lotion as directed Topical Primary Dressing PolyMem Silver Non-Adhesive Dressing, 4.25x4.25  in Discharge Instruction: Apply to wound bed as instructed Secondary Dressing Woven Gauze Sponge, Non-Sterile 4x4 in Discharge Instruction: Apply over primary dressing as directed. ALLEVYN Heel 4 1/2in x 5 1/2in / 10.5cm x 13.5cm Discharge Instruction: Apply over primary dressing as directed. Secured With Compression Wrap Kerlix Roll 4.5x3.1 (in/yd) Discharge Instruction: Apply Kerlix and Coban compression as directed. Coban Self-Adherent Wrap 4x5 (in/yd) Discharge Instruction: Apply over Kerlix as directed. Compression Stockings Add-Ons Electronic Signature(s) Signed: 02/07/2021 4:32:39 PM By: Lorrin Jackson Entered By: Lorrin Jackson on 02/07/2021 11:24:31 -------------------------------------------------------------------------------- Wound Assessment Details Patient Name: Date  a clean dressing using gauze sponges, not tissue or cotton balls. Soap and Water Discharge Instruction: May shower and wash wound with dial antibacterial soap and water prior to dressing change. Peri-Wound Care Triamcinolone 15 (g) Discharge Instruction: Use triamcinolone 15 (g) as directed Sween Lotion (Moisturizing lotion) Discharge Instruction: Apply moisturizing lotion as directed Topical Primary Dressing PolyMem Silver  Non-Adhesive Dressing, 4.25x4.25 in Discharge Instruction: Apply to wound bed as instructed Secondary Dressing Woven Gauze Sponge, Non-Sterile 4x4 in Discharge Instruction: Apply over primary dressing as directed. Secured With Compression Wrap Kerlix Roll 4.5x3.1 (in/yd) Discharge Instruction: Apply Kerlix and Coban compression as directed. Coban Self-Adherent Wrap 4x5 (in/yd) Discharge Instruction: Apply over Kerlix as directed. Compression Stockings Add-Ons Electronic Signature(s) Signed: 02/07/2021 4:32:39 PM By: Lorrin Jackson Entered By: Lorrin Jackson on 02/07/2021 11:27:21 -------------------------------------------------------------------------------- Vitals Details Patient Name: Date of Service: [name] [name] F. 02/07/2021 10:45 A M Medical Record Number: [id number] Patient Account Number: [id number] Date of Birth/Sex: Treating RN: [birth year] (75 y.o. [name] [name] Primary Care Kristine Tiley: [name], [name] Other Clinician: Referring Koran Seabrook: Treating Joeseph Verville/Extender: [name], [name] [name], [name] Weeks in Treatment: 251 Vital Signs Time Taken: 11:00 Temperature (F): 98.3 Height (in): 74 Pulse (bpm): 67 Weight (lbs): 150 Respiratory Rate (breaths/min): 16 Body Mass Index (BMI): 19.3 Blood Pressure (mmHg): 146/78 Capillary Blood Glucose (mg/dl): 118 Reference Range: 80 - 120 mg / dl Electronic Signature(s) Signed: 02/07/2021 4:32:39 PM By: Lorrin Jackson Entered By: Lorrin Jackson on 02/07/2021 11:00:43

## 2021-02-07 NOTE — Progress Notes (Signed)
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 frequency  of: - throughout the day Avoid standing for long periods of time. Off-Loading Other: - multipodus boots to both feet, elevate heel off the bed with pillows under calf Wound Treatment Wound #25 - Lower Leg Wound Laterality: Right, Lateral Cleanser: Wound Cleanser (Generic) Every Other Day/30 Days Discharge Instructions: Cleanse the wound with wound cleanser prior to applying a clean dressing using gauze sponges, not tissue or cotton balls. Cleanser: Soap and Water Every Other Day/30 Days Discharge Instructions: May shower and wash wound with dial antibacterial soap and water prior to dressing change. Peri-Wound Care: Triamcinolone 15 (g) Every Other Day/30 Days Discharge Instructions: Use  been using Hydrofera Blue. He was supposed to have a follow-up with vascular however for some reason this is been delayed till 10/25. 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 He is not complaining of pain. Some of the wounds including In his right dorsal foot actually looks some better but the improvement is minimal 10/18; vascular surgery appointment on 10/25 and follow-up. He is not complaining of pain. He has wounds on the right lateral calf and right medial calf as well as small areas on the anterior tibial all of which do not look ominous. The difficult areas are on the right lateral calcaneus and the right dorsal foot both of which have some necrotic debris and some undermining. Electronic Signature(s) Signed: 02/07/2021 4:56:56 PM By: Linton Ham MD Entered By: Linton Ham on 02/07/2021 13:00:30 -------------------------------------------------------------------------------- Physical Exam Details Patient Name: Date of Service: [name] 02/07/2021 10:45 A M Medical Record Number: [id number] Patient Account Number: [id number] Date of Birth/Sex: Treating RN: 12-02-1945 (75 y.o. [name] Primary Care Provider: [name], [name] Other Clinician: Referring Provider: Treating Provider/Extender: Stephanye Finnicum [name], [name] Weeks in Treatment: 251 Constitutional Patient is hypertensive.. Pulse regular and within target range for  patient.Marland Kitchen Respirations regular, non-labored and within target range.. Temperature is normal and within the target range for the patient.Marland Kitchen Appears in no distress. Notes Wound exam; right dorsal foot soft surface some undermining. The area on the right lateral heel again 50% of this is necrotic I used pickups and #15 scalpel to remove this. Multiple superficial wounds on the right lower leg mostly right lateral right medial all of these look fairly stable. His edema control is good no evidence of active infection Electronic Signature(s) Signed: 02/07/2021 4:56:56 PM By: Linton Ham MD Entered By: Linton Ham on 02/07/2021 13:01:34 -------------------------------------------------------------------------------- Physician Orders Details Patient Name: Date of Service: [name] 02/07/2021 10:45 A M Medical Record Number: [id number] Patient Account Number: [id number] Date of Birth/Sex: Treating RN: Jul 29, 1945 (75 y.o. 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 - Dr. Dellia Nims Return A Bathing/ Shower/ Hygiene May shower with protection but do not get wound dressing(s) wet. Edema Control - Lymphedema / SCD / Other Elevate legs to the level of the heart or above for 30 minutes daily and/or when sitting, a frequency  of: - throughout the day Avoid standing for long periods of time. Off-Loading Other: - multipodus boots to both feet, elevate heel off the bed with pillows under calf Wound Treatment Wound #25 - Lower Leg Wound Laterality: Right, Lateral Cleanser: Wound Cleanser (Generic) Every Other Day/30 Days Discharge Instructions: Cleanse the wound with wound cleanser prior to applying a clean dressing using gauze sponges, not tissue or cotton balls. Cleanser: Soap and Water Every Other Day/30 Days Discharge Instructions: May shower and wash wound with dial antibacterial soap and water prior to dressing change. Peri-Wound Care: Triamcinolone 15 (g) Every Other Day/30 Days Discharge Instructions: Use  RAYWOOD, WAILES ([id number]) Visit Report for 02/07/2021 Debridement Details Patient Name: Date of Service: [name] [facility] F. 02/07/2021 10:45 A M Medical Record Number: [id number] Patient Account Number: [id number] Date of Birth/Sex: Treating RN: 07/02/1945 (75 y.o. [name], [name] Primary Care Provider: [name], [name] Other Clinician: Referring Provider: Treating Provider/Extender: Tmya Wigington [name], [name] Weeks in Treatment: 251 Debridement Performed for Assessment: Wound #39 Right Calcaneus Performed By: Physician [name], MD Debridement Type: Debridement Severity of Tissue Pre Debridement: Fat layer exposed Level of Consciousness (Pre-procedure): Awake and Alert Pre-procedure Verification/Time Out Yes - 11:40 Taken: Start Time: 11:42 Pain Control: Other : Benzocaine 20% T Area Debrided (L x W): otal 2 (cm) x 2 (cm) = 4 (cm) Tissue and other material debrided: Viable, Non-Viable, Eschar, Slough, Subcutaneous, Slough Level: Skin/Subcutaneous Tissue Debridement Description: Excisional Instrument: Blade, Forceps Bleeding: Minimum Hemostasis Achieved: Silver Nitrate End Time: 11:44 Procedural Pain: 4 Post Procedural Pain: 2 Response to Treatment: Procedure was tolerated well Level of Consciousness (Post- Awake and Alert procedure): Post Debridement Measurements of Total Wound Length: (cm) 1.9 Stage: Category/Stage III Width: (cm) 3.8 Depth: (cm) 0.2 Volume: (cm) 1.134 Character of Wound/Ulcer Post Debridement: Requires Further Debridement Severity of Tissue Post Debridement: Fat layer exposed Post Procedure Diagnosis Same as Pre-procedure Electronic Signature(s) Signed: 02/07/2021 4:50:06 PM By: [name] Signed: 02/07/2021 4:56:56 PM By: Linton Ham MD Entered By: Linton Ham on 02/07/2021 12:58:41 -------------------------------------------------------------------------------- HPI Details Patient Name: Date of  Service: [name] 02/07/2021 10:45 A M Medical Record Number: [id number] Patient Account Number: [id number] Date of Birth/Sex: Treating RN: Apr 21, 1946 (75 y.o. [name] Primary Care Provider: [name], [name] Other Clinician: Referring Provider: Treating Provider/Extender: Fitzgerald Dunne [name], [name] Weeks in Treatment: 251 History of Present Illness Location: On the left and right lateral forefoot which has been there for about 6 months Quality: Patient reports No Pain. Severity: Patient states wound(s) are getting worse. Duration: Patient has had the wound for > 6 months prior to seeking treatment at the wound center Context: The wound would happen gradually Modifying Factors: Patient wound(s)/ulcer(s) are worsening due to :continual drainage from the wound ssociated Signs and Symptoms: Patient reports having increase discharge. A HPI Description: This patient returns after being seen here till the end of August and he was lost to follow-up. he has been quite debilitated laying in bed most of the time and his condition has deteriorated significantly. He has multiple ulcerations on the heel lateral forefoot and some of his toes. ======== Old notes: 75 year old male known to our practice when he was seen here in February and March and was lost to follow-up when he was admitted to hospital with various medical problems including coronary artery disease and a stroke. Now returns with the problem on the left forefoot where he has an ulceration and this has been there for about 6 months. most recently he was in hospital between July 6 and July 16, when he was admitted and treated for acute respiratory failure is secondary to aspiration pneumonia, large non-STEMI, ischemic cardiomyopathy with systolic and diastolic congestive heart failure with ejection fraction about 15-20%, ventricular tachycardia and has been treated with amiodarone, acute on careful up at the common  new acute CVA, acute chronic kidney disease stage III, anemia, uncontrolled diabetes mellitus with last hemoglobin A1c being 12%. He has had persistent hyperglycemia given recently. Patient has a past medical history of diabetes mellitus, hypertension, combined systolic and diastolic heart failure, peripheral neuropathy, gout, cardiomyopathy  been using Hydrofera Blue. He was supposed to have a follow-up with vascular however for some reason this is been delayed till 10/25. 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 He is not complaining of pain. Some of the wounds including In his right dorsal foot actually looks some better but the improvement is minimal 10/18; vascular surgery appointment on 10/25 and follow-up. He is not complaining of pain. He has wounds on the right lateral calf and right medial calf as well as small areas on the anterior tibial all of which do not look ominous. The difficult areas are on the right lateral calcaneus and the right dorsal foot both of which have some necrotic debris and some undermining. Electronic Signature(s) Signed: 02/07/2021 4:56:56 PM By: Linton Ham MD Entered By: Linton Ham on 02/07/2021 13:00:30 -------------------------------------------------------------------------------- Physical Exam Details Patient Name: Date of Service: [name] 02/07/2021 10:45 A M Medical Record Number: [id number] Patient Account Number: [id number] Date of Birth/Sex: Treating RN: 12-02-1945 (75 y.o. [name] Primary Care Provider: [name], [name] Other Clinician: Referring Provider: Treating Provider/Extender: Stephanye Finnicum [name], [name] Weeks in Treatment: 251 Constitutional Patient is hypertensive.. Pulse regular and within target range for  patient.Marland Kitchen Respirations regular, non-labored and within target range.. Temperature is normal and within the target range for the patient.Marland Kitchen Appears in no distress. Notes Wound exam; right dorsal foot soft surface some undermining. The area on the right lateral heel again 50% of this is necrotic I used pickups and #15 scalpel to remove this. Multiple superficial wounds on the right lower leg mostly right lateral right medial all of these look fairly stable. His edema control is good no evidence of active infection Electronic Signature(s) Signed: 02/07/2021 4:56:56 PM By: Linton Ham MD Entered By: Linton Ham on 02/07/2021 13:01:34 -------------------------------------------------------------------------------- Physician Orders Details Patient Name: Date of Service: [name] 02/07/2021 10:45 A M Medical Record Number: [id number] Patient Account Number: [id number] Date of Birth/Sex: Treating RN: Jul 29, 1945 (75 y.o. 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 - Dr. Dellia Nims Return A Bathing/ Shower/ Hygiene May shower with protection but do not get wound dressing(s) wet. Edema Control - Lymphedema / SCD / Other Elevate legs to the level of the heart or above for 30 minutes daily and/or when sitting, a frequency  of: - throughout the day Avoid standing for long periods of time. Off-Loading Other: - multipodus boots to both feet, elevate heel off the bed with pillows under calf Wound Treatment Wound #25 - Lower Leg Wound Laterality: Right, Lateral Cleanser: Wound Cleanser (Generic) Every Other Day/30 Days Discharge Instructions: Cleanse the wound with wound cleanser prior to applying a clean dressing using gauze sponges, not tissue or cotton balls. Cleanser: Soap and Water Every Other Day/30 Days Discharge Instructions: May shower and wash wound with dial antibacterial soap and water prior to dressing change. Peri-Wound Care: Triamcinolone 15 (g) Every Other Day/30 Days Discharge Instructions: Use  RAYWOOD, WAILES ([id number]) Visit Report for 02/07/2021 Debridement Details Patient Name: Date of Service: [name] [facility] F. 02/07/2021 10:45 A M Medical Record Number: [id number] Patient Account Number: [id number] Date of Birth/Sex: Treating RN: 07/02/1945 (75 y.o. [name], [name] Primary Care Provider: [name], [name] Other Clinician: Referring Provider: Treating Provider/Extender: Tmya Wigington [name], [name] Weeks in Treatment: 251 Debridement Performed for Assessment: Wound #39 Right Calcaneus Performed By: Physician [name], MD Debridement Type: Debridement Severity of Tissue Pre Debridement: Fat layer exposed Level of Consciousness (Pre-procedure): Awake and Alert Pre-procedure Verification/Time Out Yes - 11:40 Taken: Start Time: 11:42 Pain Control: Other : Benzocaine 20% T Area Debrided (L x W): otal 2 (cm) x 2 (cm) = 4 (cm) Tissue and other material debrided: Viable, Non-Viable, Eschar, Slough, Subcutaneous, Slough Level: Skin/Subcutaneous Tissue Debridement Description: Excisional Instrument: Blade, Forceps Bleeding: Minimum Hemostasis Achieved: Silver Nitrate End Time: 11:44 Procedural Pain: 4 Post Procedural Pain: 2 Response to Treatment: Procedure was tolerated well Level of Consciousness (Post- Awake and Alert procedure): Post Debridement Measurements of Total Wound Length: (cm) 1.9 Stage: Category/Stage III Width: (cm) 3.8 Depth: (cm) 0.2 Volume: (cm) 1.134 Character of Wound/Ulcer Post Debridement: Requires Further Debridement Severity of Tissue Post Debridement: Fat layer exposed Post Procedure Diagnosis Same as Pre-procedure Electronic Signature(s) Signed: 02/07/2021 4:50:06 PM By: [name] Signed: 02/07/2021 4:56:56 PM By: Linton Ham MD Entered By: Linton Ham on 02/07/2021 12:58:41 -------------------------------------------------------------------------------- HPI Details Patient Name: Date of  Service: [name] 02/07/2021 10:45 A M Medical Record Number: [id number] Patient Account Number: [id number] Date of Birth/Sex: Treating RN: Apr 21, 1946 (75 y.o. [name] Primary Care Provider: [name], [name] Other Clinician: Referring Provider: Treating Provider/Extender: Fitzgerald Dunne [name], [name] Weeks in Treatment: 251 History of Present Illness Location: On the left and right lateral forefoot which has been there for about 6 months Quality: Patient reports No Pain. Severity: Patient states wound(s) are getting worse. Duration: Patient has had the wound for > 6 months prior to seeking treatment at the wound center Context: The wound would happen gradually Modifying Factors: Patient wound(s)/ulcer(s) are worsening due to :continual drainage from the wound ssociated Signs and Symptoms: Patient reports having increase discharge. A HPI Description: This patient returns after being seen here till the end of August and he was lost to follow-up. he has been quite debilitated laying in bed most of the time and his condition has deteriorated significantly. He has multiple ulcerations on the heel lateral forefoot and some of his toes. ======== Old notes: 75 year old male known to our practice when he was seen here in February and March and was lost to follow-up when he was admitted to hospital with various medical problems including coronary artery disease and a stroke. Now returns with the problem on the left forefoot where he has an ulceration and this has been there for about 6 months. most recently he was in hospital between July 6 and July 16, when he was admitted and treated for acute respiratory failure is secondary to aspiration pneumonia, large non-STEMI, ischemic cardiomyopathy with systolic and diastolic congestive heart failure with ejection fraction about 15-20%, ventricular tachycardia and has been treated with amiodarone, acute on careful up at the common  new acute CVA, acute chronic kidney disease stage III, anemia, uncontrolled diabetes mellitus with last hemoglobin A1c being 12%. He has had persistent hyperglycemia given recently. Patient has a past medical history of diabetes mellitus, hypertension, combined systolic and diastolic heart failure, peripheral neuropathy, gout, cardiomyopathy  been using Hydrofera Blue. He was supposed to have a follow-up with vascular however for some reason this is been delayed till 10/25. 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 He is not complaining of pain. Some of the wounds including In his right dorsal foot actually looks some better but the improvement is minimal 10/18; vascular surgery appointment on 10/25 and follow-up. He is not complaining of pain. He has wounds on the right lateral calf and right medial calf as well as small areas on the anterior tibial all of which do not look ominous. The difficult areas are on the right lateral calcaneus and the right dorsal foot both of which have some necrotic debris and some undermining. Electronic Signature(s) Signed: 02/07/2021 4:56:56 PM By: Linton Ham MD Entered By: Linton Ham on 02/07/2021 13:00:30 -------------------------------------------------------------------------------- Physical Exam Details Patient Name: Date of Service: [name] 02/07/2021 10:45 A M Medical Record Number: [id number] Patient Account Number: [id number] Date of Birth/Sex: Treating RN: 12-02-1945 (75 y.o. [name] Primary Care Provider: [name], [name] Other Clinician: Referring Provider: Treating Provider/Extender: Stephanye Finnicum [name], [name] Weeks in Treatment: 251 Constitutional Patient is hypertensive.. Pulse regular and within target range for  patient.Marland Kitchen Respirations regular, non-labored and within target range.. Temperature is normal and within the target range for the patient.Marland Kitchen Appears in no distress. Notes Wound exam; right dorsal foot soft surface some undermining. The area on the right lateral heel again 50% of this is necrotic I used pickups and #15 scalpel to remove this. Multiple superficial wounds on the right lower leg mostly right lateral right medial all of these look fairly stable. His edema control is good no evidence of active infection Electronic Signature(s) Signed: 02/07/2021 4:56:56 PM By: Linton Ham MD Entered By: Linton Ham on 02/07/2021 13:01:34 -------------------------------------------------------------------------------- Physician Orders Details Patient Name: Date of Service: [name] 02/07/2021 10:45 A M Medical Record Number: [id number] Patient Account Number: [id number] Date of Birth/Sex: Treating RN: Jul 29, 1945 (75 y.o. 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 - Dr. Dellia Nims Return A Bathing/ Shower/ Hygiene May shower with protection but do not get wound dressing(s) wet. Edema Control - Lymphedema / SCD / Other Elevate legs to the level of the heart or above for 30 minutes daily and/or when sitting, a frequency  of: - throughout the day Avoid standing for long periods of time. Off-Loading Other: - multipodus boots to both feet, elevate heel off the bed with pillows under calf Wound Treatment Wound #25 - Lower Leg Wound Laterality: Right, Lateral Cleanser: Wound Cleanser (Generic) Every Other Day/30 Days Discharge Instructions: Cleanse the wound with wound cleanser prior to applying a clean dressing using gauze sponges, not tissue or cotton balls. Cleanser: Soap and Water Every Other Day/30 Days Discharge Instructions: May shower and wash wound with dial antibacterial soap and water prior to dressing change. Peri-Wound Care: Triamcinolone 15 (g) Every Other Day/30 Days Discharge Instructions: Use  been using Hydrofera Blue. He was supposed to have a follow-up with vascular however for some reason this is been delayed till 10/25. 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 He is not complaining of pain. Some of the wounds including In his right dorsal foot actually looks some better but the improvement is minimal 10/18; vascular surgery appointment on 10/25 and follow-up. He is not complaining of pain. He has wounds on the right lateral calf and right medial calf as well as small areas on the anterior tibial all of which do not look ominous. The difficult areas are on the right lateral calcaneus and the right dorsal foot both of which have some necrotic debris and some undermining. Electronic Signature(s) Signed: 02/07/2021 4:56:56 PM By: Linton Ham MD Entered By: Linton Ham on 02/07/2021 13:00:30 -------------------------------------------------------------------------------- Physical Exam Details Patient Name: Date of Service: [name] 02/07/2021 10:45 A M Medical Record Number: [id number] Patient Account Number: [id number] Date of Birth/Sex: Treating RN: 12-02-1945 (75 y.o. [name] Primary Care Provider: [name], [name] Other Clinician: Referring Provider: Treating Provider/Extender: Stephanye Finnicum [name], [name] Weeks in Treatment: 251 Constitutional Patient is hypertensive.. Pulse regular and within target range for  patient.Marland Kitchen Respirations regular, non-labored and within target range.. Temperature is normal and within the target range for the patient.Marland Kitchen Appears in no distress. Notes Wound exam; right dorsal foot soft surface some undermining. The area on the right lateral heel again 50% of this is necrotic I used pickups and #15 scalpel to remove this. Multiple superficial wounds on the right lower leg mostly right lateral right medial all of these look fairly stable. His edema control is good no evidence of active infection Electronic Signature(s) Signed: 02/07/2021 4:56:56 PM By: Linton Ham MD Entered By: Linton Ham on 02/07/2021 13:01:34 -------------------------------------------------------------------------------- Physician Orders Details Patient Name: Date of Service: [name] 02/07/2021 10:45 A M Medical Record Number: [id number] Patient Account Number: [id number] Date of Birth/Sex: Treating RN: Jul 29, 1945 (75 y.o. 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

## 2021-02-14 ENCOUNTER — Ambulatory Visit (HOSPITAL_COMMUNITY)
Admission: RE | Admit: 2021-02-14 | Discharge: 2021-02-14 | Disposition: A | Discharge status: Home / Self Care | Payer: Medicare HMO | Source: Ambulatory Visit | Attending: Vascular Surgery | Admitting: Vascular Surgery

## 2021-02-14 ENCOUNTER — Other Ambulatory Visit: Payer: Self-pay

## 2021-02-14 ENCOUNTER — Ambulatory Visit (INDEPENDENT_AMBULATORY_CARE_PROVIDER_SITE_OTHER): Payer: Medicare HMO | Admitting: Physician Assistant

## 2021-02-14 ENCOUNTER — Ambulatory Visit (INDEPENDENT_AMBULATORY_CARE_PROVIDER_SITE_OTHER)
Admission: RE | Admit: 2021-02-14 | Discharge: 2021-02-14 | Disposition: A | Discharge status: Home / Self Care | Payer: Medicare HMO | Source: Ambulatory Visit | Attending: Vascular Surgery | Admitting: Vascular Surgery

## 2021-02-14 VITALS — BP 112/63 | HR 64 | Temp 97.5°F | Resp 16 | Ht 73.0 in

## 2021-02-14 DIAGNOSIS — I739 Peripheral vascular disease, unspecified: Secondary | ICD-10-CM
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
## 2021-02-15 ENCOUNTER — Other Ambulatory Visit: Payer: Self-pay

## 2021-02-15 DIAGNOSIS — I739 Peripheral vascular disease, unspecified: Secondary | ICD-10-CM

## 2021-02-21 ENCOUNTER — Encounter (HOSPITAL_BASED_OUTPATIENT_CLINIC_OR_DEPARTMENT_OTHER): Payer: Medicare HMO | Attending: Internal Medicine | Admitting: Internal Medicine

## 2021-02-21 ENCOUNTER — Other Ambulatory Visit: Payer: Self-pay

## 2021-02-21 DIAGNOSIS — X58XXXD Exposure to other specified factors, subsequent encounter: Secondary | ICD-10-CM | POA: Insufficient documentation

## 2021-02-21 DIAGNOSIS — L97218 Non-pressure chronic ulcer of right calf with other specified severity: Secondary | ICD-10-CM | POA: Insufficient documentation

## 2021-02-21 DIAGNOSIS — N183 Chronic kidney disease, stage 3 unspecified: Secondary | ICD-10-CM | POA: Diagnosis not present

## 2021-02-21 DIAGNOSIS — L97418 Non-pressure chronic ulcer of right heel and midfoot with other specified severity: Secondary | ICD-10-CM | POA: Insufficient documentation

## 2021-02-21 DIAGNOSIS — E11621 Type 2 diabetes mellitus with foot ulcer: Secondary | ICD-10-CM | POA: Diagnosis not present

## 2021-02-21 DIAGNOSIS — L89613 Pressure ulcer of right heel, stage 3: Secondary | ICD-10-CM | POA: Diagnosis not present

## 2021-02-21 DIAGNOSIS — E1140 Type 2 diabetes mellitus with diabetic neuropathy, unspecified: Secondary | ICD-10-CM | POA: Insufficient documentation

## 2021-02-21 DIAGNOSIS — E1122 Type 2 diabetes mellitus with diabetic chronic kidney disease: Secondary | ICD-10-CM | POA: Insufficient documentation

## 2021-02-21 DIAGNOSIS — I130 Hypertensive heart and chronic kidney disease with heart failure and stage 1 through stage 4 chronic kidney disease, or unspecified chronic kidney disease: Secondary | ICD-10-CM | POA: Insufficient documentation

## 2021-02-21 DIAGNOSIS — N312 Flaccid neuropathic bladder, not elsewhere classified: Secondary | ICD-10-CM | POA: Diagnosis not present

## 2021-02-21 DIAGNOSIS — I5042 Chronic combined systolic (congestive) and diastolic (congestive) heart failure: Secondary | ICD-10-CM | POA: Diagnosis not present

## 2021-02-21 DIAGNOSIS — S80211D Abrasion, right knee, subsequent encounter: Secondary | ICD-10-CM | POA: Insufficient documentation

## 2021-02-21 DIAGNOSIS — L97518 Non-pressure chronic ulcer of other part of right foot with other specified severity: Secondary | ICD-10-CM | POA: Diagnosis not present

## 2021-02-21 NOTE — Progress Notes (Signed)
By: [name] Entered By: [name] on 02/21/2021 11:43:43 -------------------------------------------------------------------------------- Wound Assessment Details Patient Name: Date of Service: [name] Foley F. 02/21/2021 10:30 A M Medical Record Number: [id number] Patient Account Number: [id number] Date of Birth/Sex: Treating RN: Aug 09, 1945 (75 y.o. [name] [name], [name] [name] Primary Care Tinisha Etzkorn: [name], [name] Other Clinician: Referring Anoushka Divito: Treating Mareesa Gathright/Extender: [name], [name] [name], [name] Weeks in Treatment: 253 Wound Status Wound Number: 25 Primary Venous Leg Ulcer Etiology: Wound Location: Right, Lateral Lower Leg Wound Open Wounding Event:  Gradually Appeared Status: Date Acquired: 07/06/2019 Comorbid Anemia, Sleep Apnea, Arrhythmia, Congestive Heart Failure, Weeks Of Treatment: 83 History: Coronary Artery Disease, Hypertension, Myocardial Infarction, Clustered Wound: No Type II Diabetes, Gout, Neuropathy Photos Wound Measurements Length: (cm) 5 Width: (cm) 3 Depth: (cm) 0.2 Area: (cm) 11.781 Volume: (cm) 2.356 % Reduction in Area: -331.1% % Reduction in Volume: -763% Epithelialization: Small (1-33%) Wound Description Classification: Full Thickness Without Exposed Support Structures Wound Margin: Flat and Intact Exudate Amount: Medium Exudate Type: Purulent Exudate Color: yellow, brown, green Foul Odor After Cleansing: No Slough/Fibrino Yes Wound Bed Granulation Amount: Large (67-100%) Exposed Structure Granulation Quality: Pink, Pale Fascia Exposed: No Necrotic Amount: Small (1-33%) Fat Layer (Subcutaneous Tissue) Exposed: Yes Necrotic Quality: Adherent Slough Tendon Exposed: No Muscle Exposed: No Joint Exposed: No Bone Exposed: No Treatment Notes Wound #25 (Lower Leg) Wound Laterality: Right, Lateral Cleanser Wound Cleanser Discharge Instruction: Cleanse the wound with wound cleanser prior to applying a clean dressing using gauze sponges, not tissue or cotton balls. Soap and Water Discharge Instruction: May shower and wash wound with dial antibacterial soap and water prior to dressing change. Peri-Wound Care Triamcinolone 15 (g) Discharge Instruction: Use triamcinolone 15 (g) as directed Sween Lotion (Moisturizing lotion) Discharge Instruction: Apply moisturizing lotion as directed Topical Primary Dressing PolyMem Silver Non-Adhesive Dressing, 4.25x4.25 in Discharge Instruction: Apply to wound bed as instructed Secondary Dressing Woven Gauze Sponge, Non-Sterile 4x4 in Discharge Instruction: Apply over primary dressing as directed. ABD Pad, 5x9 Discharge Instruction: Apply over primary dressing  as directed. Secured With Compression Wrap Kerlix Roll 4.5x3.1 (in/yd) Discharge Instruction: Apply Kerlix and Coban compression as directed. Coban Self-Adherent Wrap 4x5 (in/yd) Discharge Instruction: Apply over Kerlix as directed. Compression Stockings Add-Ons Electronic Signature(s) Signed: 02/21/2021 4:48:23 PM By: [name] Entered By: [name] on 02/21/2021 11:08:03 -------------------------------------------------------------------------------- Wound Assessment Details Patient Name: Date of Service: [name] Foley F. 02/21/2021 10:30 A M Medical Record Number: [id number] Patient Account Number: [id number] Date of Birth/Sex: Treating RN: January 23, 1946 (75 y.o. [name] [name], [name] [name] Primary Care Lee Kalt: [name], [name] Other Clinician: Referring Kaylianna Detert: Treating Karah Caruthers/Extender: [name], [name] [name], [name] Weeks in Treatment: 253 Wound Status Wound Number: 34 Primary Diabetic Wound/Ulcer of the Lower Extremity Etiology: Wound Location: Right, Medial, Dorsal Foot Wound Open Wounding Event: Gradually Appeared Status: Date Acquired: 06/14/2020 Comorbid Anemia, Sleep Apnea, Arrhythmia, Congestive Heart Failure, Weeks Of Treatment: 36 History: Coronary Artery Disease, Hypertension, Myocardial Infarction, Clustered Wound: No Type II Diabetes, Gout, Neuropathy Wound Measurements Length: (cm) 2 Width: (cm) 1.5 Depth: (cm) 2 Area: (cm) 2.356 Volume: (cm) 4.712 % Reduction in Area: -756.7% % Reduction in Volume: -17351.9% Epithelialization: None Tunneling: No Undermining: Yes Starting Position (o'clock): 12 Ending Position (o'clock): 12 Maximum Distance: (cm) 4.7 Wound Description Classification: Grade 2 Wound Margin: Well defined, not attached Exudate Amount: Medium Exudate Type: Serosanguineous Exudate Color: red, brown Foul Odor After Cleansing: No Slough/Fibrino Yes Wound Bed Granulation Amount: Medium (34-66%) Exposed  Structure Granulation Quality: Red, Pink Fascia Exposed: No Necrotic  [name] [name], [name] [name] ([id number]) Visit Report for 02/21/2021 Arrival Information Details Patient Name: Date of Service: [name] [name] [facility] F. 02/21/2021 10:30 A M Medical Record Number: [id number] Patient Account Number: [id number] Date of Birth/Sex: Treating RN: [birth year] (75 y.o. [name] [name], [name] [name] Primary Care Kirston Luty: [name], [name] Other Clinician: Referring Lamin Chandley: Treating Kalib Bhagat/Extender: [name], [name] [name], [name] Weeks in Treatment: 253 Visit Information History Since Last Visit Added or deleted any medications: No Patient Arrived: Wheel Chair Any new allergies or adverse reactions: No Arrival Time: 10:47 Had a fall or experienced change in No Accompanied By: daughter activities of daily living that may affect Transfer Assistance: Manual risk of falls: Patient Identification Verified: Yes Signs or symptoms of abuse/neglect since last visito No Secondary Verification Process Completed: Yes Hospitalized since last visit: No Patient Requires Transmission-Based Precautions: No Implantable device outside of the clinic excluding No Patient Has Alerts: Yes cellular tissue based products placed in the center Patient Alerts: Patient on Blood Thinner since last visit: left ABI 1.0; rt ABI 1.09 Has Dressing in Place as Prescribed: Yes Has Compression in Place as Prescribed: Yes Pain Present Now: No Electronic Signature(s) Signed: 02/21/2021 4:48:23 PM By: [name] Entered By: [name] on 02/21/2021 10:52:38 -------------------------------------------------------------------------------- Encounter Discharge Information Details Patient Name: Date of Service: [name] Foley F. 02/21/2021 10:30 A M Medical Record Number: [id number] Patient Account Number: [id number] Date of Birth/Sex: Treating RN: June 25, 1945 (75 y.o. [name] [name], [name] [name] Primary Care Oniyah Rohe: [name], [name] Other Clinician: Referring Abdel Effinger: Treating Kathan Kirker/Extender: [name],  [name] [name], [name] Weeks in Treatment: 253 Encounter Discharge Information Items Post Procedure Vitals Discharge Condition: Stable Temperature (F): 97.4 Ambulatory Status: Wheelchair Pulse (bpm): 74 Discharge Destination: Home Respiratory Rate (breaths/min): 17 Transportation: Private Auto Blood Pressure (mmHg): 134/73 Accompanied By: self Schedule Follow-up Appointment: Yes Clinical Summary of Care: Patient Declined Electronic Signature(s) Signed: 02/21/2021 4:48:23 PM By: [name] Entered By: [name] on 02/21/2021 12:13:06 -------------------------------------------------------------------------------- Lower Extremity Assessment Details Patient Name: Date of Service: [name] Foley F. 02/21/2021 10:30 A M Medical Record Number: [id number] Patient Account Number: [id number] Date of Birth/Sex: Treating RN: [birth year] (75 y.o. [name] [name], [name] [name] Primary Care Shirlene Andaya: [name], [name] Other Clinician: Referring Kj Imbert: Treating Sriya Kroeze/Extender: [name], [name] [name], [name] Weeks in Treatment: 253 Edema Assessment Assessed: Shirlyn Goltz: No] Patrice Paradise: Yes] Edema: [Left: Ye] [Right: s] Calf Left: Right: Point of Measurement: 36 cm From Medial Instep 26 cm Ankle Left: Right: Point of Measurement: 11 cm From Medial Instep 21.5 cm Vascular Assessment Pulses: Dorsalis Pedis Palpable: [Right:Yes] Posterior Tibial Palpable: [Right:Yes] Electronic Signature(s) Signed: 02/21/2021 4:48:23 PM By: [name] Entered By: [name] on 02/21/2021 10:53:43 -------------------------------------------------------------------------------- Multi Wound Chart Details Patient Name: Date of Service: [name] Foley F. 02/21/2021 10:30 A M Medical Record Number: [id number] Patient Account Number: [id number] Date of Birth/Sex: Treating RN: [birth year] (75 y.o. [name] [name], [name] [name] Primary Care Deondrae Mcgrail: [name], [name] Other Clinician: Referring  Elzie Sheets: Treating Jurnie Garritano/Extender: [name], [name] [name], [name] Weeks in Treatment: 253 Vital Signs Height(in): 74 Capillary Blood Glucose(mg/dl): 118 Weight(lbs): 150 Pulse(bpm): 64 Body Mass Index(BMI): 19 Blood Pressure(mmHg): 133/74 Temperature(F): 98.7 Respiratory Rate(breaths/min): 17 Photos: [25:Right, Lateral Lower Leg] [34:No Photos Right, Medial, Dorsal Foot] [39:No Photos Right Calcaneus] Wound Location: [25:Gradually Appeared] [34:Gradually Appeared] [39:Pressure Injury] Wounding Event: [25:Venous Leg Ulcer] [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  By: [name] Entered By: [name] on 02/21/2021 11:43:43 -------------------------------------------------------------------------------- Wound Assessment Details Patient Name: Date of Service: [name] Foley F. 02/21/2021 10:30 A M Medical Record Number: [id number] Patient Account Number: [id number] Date of Birth/Sex: Treating RN: Aug 09, 1945 (75 y.o. [name] [name], [name] [name] Primary Care Tinisha Etzkorn: [name], [name] Other Clinician: Referring Anoushka Divito: Treating Mareesa Gathright/Extender: [name], [name] [name], [name] Weeks in Treatment: 253 Wound Status Wound Number: 25 Primary Venous Leg Ulcer Etiology: Wound Location: Right, Lateral Lower Leg Wound Open Wounding Event:  Gradually Appeared Status: Date Acquired: 07/06/2019 Comorbid Anemia, Sleep Apnea, Arrhythmia, Congestive Heart Failure, Weeks Of Treatment: 83 History: Coronary Artery Disease, Hypertension, Myocardial Infarction, Clustered Wound: No Type II Diabetes, Gout, Neuropathy Photos Wound Measurements Length: (cm) 5 Width: (cm) 3 Depth: (cm) 0.2 Area: (cm) 11.781 Volume: (cm) 2.356 % Reduction in Area: -331.1% % Reduction in Volume: -763% Epithelialization: Small (1-33%) Wound Description Classification: Full Thickness Without Exposed Support Structures Wound Margin: Flat and Intact Exudate Amount: Medium Exudate Type: Purulent Exudate Color: yellow, brown, green Foul Odor After Cleansing: No Slough/Fibrino Yes Wound Bed Granulation Amount: Large (67-100%) Exposed Structure Granulation Quality: Pink, Pale Fascia Exposed: No Necrotic Amount: Small (1-33%) Fat Layer (Subcutaneous Tissue) Exposed: Yes Necrotic Quality: Adherent Slough Tendon Exposed: No Muscle Exposed: No Joint Exposed: No Bone Exposed: No Treatment Notes Wound #25 (Lower Leg) Wound Laterality: Right, Lateral Cleanser Wound Cleanser Discharge Instruction: Cleanse the wound with wound cleanser prior to applying a clean dressing using gauze sponges, not tissue or cotton balls. Soap and Water Discharge Instruction: May shower and wash wound with dial antibacterial soap and water prior to dressing change. Peri-Wound Care Triamcinolone 15 (g) Discharge Instruction: Use triamcinolone 15 (g) as directed Sween Lotion (Moisturizing lotion) Discharge Instruction: Apply moisturizing lotion as directed Topical Primary Dressing PolyMem Silver Non-Adhesive Dressing, 4.25x4.25 in Discharge Instruction: Apply to wound bed as instructed Secondary Dressing Woven Gauze Sponge, Non-Sterile 4x4 in Discharge Instruction: Apply over primary dressing as directed. ABD Pad, 5x9 Discharge Instruction: Apply over primary dressing  as directed. Secured With Compression Wrap Kerlix Roll 4.5x3.1 (in/yd) Discharge Instruction: Apply Kerlix and Coban compression as directed. Coban Self-Adherent Wrap 4x5 (in/yd) Discharge Instruction: Apply over Kerlix as directed. Compression Stockings Add-Ons Electronic Signature(s) Signed: 02/21/2021 4:48:23 PM By: [name] Entered By: [name] on 02/21/2021 11:08:03 -------------------------------------------------------------------------------- Wound Assessment Details Patient Name: Date of Service: [name] Foley F. 02/21/2021 10:30 A M Medical Record Number: [id number] Patient Account Number: [id number] Date of Birth/Sex: Treating RN: January 23, 1946 (75 y.o. [name] [name], [name] [name] Primary Care Lee Kalt: [name], [name] Other Clinician: Referring Kaylianna Detert: Treating Karah Caruthers/Extender: [name], [name] [name], [name] Weeks in Treatment: 253 Wound Status Wound Number: 34 Primary Diabetic Wound/Ulcer of the Lower Extremity Etiology: Wound Location: Right, Medial, Dorsal Foot Wound Open Wounding Event: Gradually Appeared Status: Date Acquired: 06/14/2020 Comorbid Anemia, Sleep Apnea, Arrhythmia, Congestive Heart Failure, Weeks Of Treatment: 36 History: Coronary Artery Disease, Hypertension, Myocardial Infarction, Clustered Wound: No Type II Diabetes, Gout, Neuropathy Wound Measurements Length: (cm) 2 Width: (cm) 1.5 Depth: (cm) 2 Area: (cm) 2.356 Volume: (cm) 4.712 % Reduction in Area: -756.7% % Reduction in Volume: -17351.9% Epithelialization: None Tunneling: No Undermining: Yes Starting Position (o'clock): 12 Ending Position (o'clock): 12 Maximum Distance: (cm) 4.7 Wound Description Classification: Grade 2 Wound Margin: Well defined, not attached Exudate Amount: Medium Exudate Type: Serosanguineous Exudate Color: red, brown Foul Odor After Cleansing: No Slough/Fibrino Yes Wound Bed Granulation Amount: Medium (34-66%) Exposed  Structure Granulation Quality: Red, Pink Fascia Exposed: No Necrotic  By: [name] Entered By: [name] on 02/21/2021 11:43:43 -------------------------------------------------------------------------------- Wound Assessment Details Patient Name: Date of Service: [name] Foley F. 02/21/2021 10:30 A M Medical Record Number: [id number] Patient Account Number: [id number] Date of Birth/Sex: Treating RN: Aug 09, 1945 (75 y.o. [name] [name], [name] [name] Primary Care Tinisha Etzkorn: [name], [name] Other Clinician: Referring Anoushka Divito: Treating Mareesa Gathright/Extender: [name], [name] [name], [name] Weeks in Treatment: 253 Wound Status Wound Number: 25 Primary Venous Leg Ulcer Etiology: Wound Location: Right, Lateral Lower Leg Wound Open Wounding Event:  Gradually Appeared Status: Date Acquired: 07/06/2019 Comorbid Anemia, Sleep Apnea, Arrhythmia, Congestive Heart Failure, Weeks Of Treatment: 83 History: Coronary Artery Disease, Hypertension, Myocardial Infarction, Clustered Wound: No Type II Diabetes, Gout, Neuropathy Photos Wound Measurements Length: (cm) 5 Width: (cm) 3 Depth: (cm) 0.2 Area: (cm) 11.781 Volume: (cm) 2.356 % Reduction in Area: -331.1% % Reduction in Volume: -763% Epithelialization: Small (1-33%) Wound Description Classification: Full Thickness Without Exposed Support Structures Wound Margin: Flat and Intact Exudate Amount: Medium Exudate Type: Purulent Exudate Color: yellow, brown, green Foul Odor After Cleansing: No Slough/Fibrino Yes Wound Bed Granulation Amount: Large (67-100%) Exposed Structure Granulation Quality: Pink, Pale Fascia Exposed: No Necrotic Amount: Small (1-33%) Fat Layer (Subcutaneous Tissue) Exposed: Yes Necrotic Quality: Adherent Slough Tendon Exposed: No Muscle Exposed: No Joint Exposed: No Bone Exposed: No Treatment Notes Wound #25 (Lower Leg) Wound Laterality: Right, Lateral Cleanser Wound Cleanser Discharge Instruction: Cleanse the wound with wound cleanser prior to applying a clean dressing using gauze sponges, not tissue or cotton balls. Soap and Water Discharge Instruction: May shower and wash wound with dial antibacterial soap and water prior to dressing change. Peri-Wound Care Triamcinolone 15 (g) Discharge Instruction: Use triamcinolone 15 (g) as directed Sween Lotion (Moisturizing lotion) Discharge Instruction: Apply moisturizing lotion as directed Topical Primary Dressing PolyMem Silver Non-Adhesive Dressing, 4.25x4.25 in Discharge Instruction: Apply to wound bed as instructed Secondary Dressing Woven Gauze Sponge, Non-Sterile 4x4 in Discharge Instruction: Apply over primary dressing as directed. ABD Pad, 5x9 Discharge Instruction: Apply over primary dressing  as directed. Secured With Compression Wrap Kerlix Roll 4.5x3.1 (in/yd) Discharge Instruction: Apply Kerlix and Coban compression as directed. Coban Self-Adherent Wrap 4x5 (in/yd) Discharge Instruction: Apply over Kerlix as directed. Compression Stockings Add-Ons Electronic Signature(s) Signed: 02/21/2021 4:48:23 PM By: [name] Entered By: [name] on 02/21/2021 11:08:03 -------------------------------------------------------------------------------- Wound Assessment Details Patient Name: Date of Service: [name] Foley F. 02/21/2021 10:30 A M Medical Record Number: [id number] Patient Account Number: [id number] Date of Birth/Sex: Treating RN: January 23, 1946 (75 y.o. [name] [name], [name] [name] Primary Care Lee Kalt: [name], [name] Other Clinician: Referring Kaylianna Detert: Treating Karah Caruthers/Extender: [name], [name] [name], [name] Weeks in Treatment: 253 Wound Status Wound Number: 34 Primary Diabetic Wound/Ulcer of the Lower Extremity Etiology: Wound Location: Right, Medial, Dorsal Foot Wound Open Wounding Event: Gradually Appeared Status: Date Acquired: 06/14/2020 Comorbid Anemia, Sleep Apnea, Arrhythmia, Congestive Heart Failure, Weeks Of Treatment: 36 History: Coronary Artery Disease, Hypertension, Myocardial Infarction, Clustered Wound: No Type II Diabetes, Gout, Neuropathy Wound Measurements Length: (cm) 2 Width: (cm) 1.5 Depth: (cm) 2 Area: (cm) 2.356 Volume: (cm) 4.712 % Reduction in Area: -756.7% % Reduction in Volume: -17351.9% Epithelialization: None Tunneling: No Undermining: Yes Starting Position (o'clock): 12 Ending Position (o'clock): 12 Maximum Distance: (cm) 4.7 Wound Description Classification: Grade 2 Wound Margin: Well defined, not attached Exudate Amount: Medium Exudate Type: Serosanguineous Exudate Color: red, brown Foul Odor After Cleansing: No Slough/Fibrino Yes Wound Bed Granulation Amount: Medium (34-66%) Exposed  Structure Granulation Quality: Red, Pink Fascia Exposed: No Necrotic  Amount: Medium (34-66%) Fat Layer (Subcutaneous Tissue) Exposed: Yes Necrotic Quality: Adherent Slough Tendon Exposed: Yes Muscle Exposed: No Joint Exposed: No Bone Exposed: Yes Treatment Notes Wound #34 (Foot) Wound Laterality: Dorsal, Right, Medial Cleanser Wound Cleanser Discharge Instruction: Cleanse the wound with wound cleanser prior to applying a clean dressing using gauze sponges, not tissue or cotton balls. Soap and Water Discharge Instruction: May shower and wash wound with dial antibacterial soap and water prior to dressing change. Peri-Wound Care Triamcinolone 15 (g) Discharge Instruction: Use triamcinolone 15 (g) as directed Sween Lotion (Moisturizing lotion) Discharge Instruction: Apply moisturizing lotion as directed Topical Primary Dressing KerraCel Ag Gelling Fiber Dressing, 4x5 in (silver alginate) Discharge Instruction: Apply silver alginate to wound bed as instructed Secondary Dressing Woven Gauze Sponge, Non-Sterile 4x4 in Discharge Instruction: Apply over primary dressing as directed. ABD Pad, 5x9 Discharge Instruction: Apply over primary dressing as directed. Secured With Compression Wrap Kerlix Roll 4.5x3.1 (in/yd) Discharge Instruction: Apply Kerlix and Coban compression as directed. Coban Self-Adherent Wrap 4x5 (in/yd) Discharge Instruction: Apply over Kerlix as directed. Compression Stockings Add-Ons Electronic Signature(s) Signed: 02/21/2021 4:48:23 PM By: [name] Entered By: [name] on 02/21/2021 11:16:32 -------------------------------------------------------------------------------- Wound Assessment Details Patient Name: Date of Service: [name] Foley F. 02/21/2021 10:30 A M Medical Record Number: [id number] Patient Account Number: [id number] Date of Birth/Sex: Treating RN: May 11, 1945 (75 y.o. [name] [name], [name] [name] Primary Care Laykin Rainone: [name],  [name] Other Clinician: Referring Kemiya Batdorf: Treating Trevel Dillenbeck/Extender: [name], [name] [name], [name] Weeks in Treatment: 253 Wound Status Wound Number: 39 Primary Pressure Ulcer Etiology: Wound Location: Right Calcaneus Secondary Arterial Insufficiency Ulcer Wounding Event: Pressure Injury Etiology: Date Acquired: 11/15/2020 Wound Open Weeks Of Treatment: 14 Status: Clustered Wound: No Comorbid Anemia, Sleep Apnea, Arrhythmia, Congestive Heart Failure, History: Coronary Artery Disease, Hypertension, Myocardial Infarction, Type II Diabetes, Gout, Neuropathy Wound Measurements Length: (cm) 4.5 Width: (cm) 2 Depth: (cm) 0.8 Area: (cm) 7.069 Volume: (cm) 5.655 Wound Description Classification: Category/Stage III Wound Margin: Distinct, outline attached Exudate Amount: Medium Exudate Type: Purulent Exudate Color: yellow, brown, green Foul Odor After Cleansing: Slough/Fibrino % Reduction in Area: -177.8% % Reduction in Volume: -2126.4% Epithelialization: None Tunneling: No Undermining: No No Yes Wound Bed Granulation Amount: Small (1-33%) Exposed Structure Granulation Quality: Pink, Pale Fascia Exposed: No Necrotic Amount: Large (67-100%) Fat Layer (Subcutaneous Tissue) Exposed: Yes Necrotic Quality: Eschar, Adherent Slough Tendon Exposed: No Muscle Exposed: No Joint Exposed: No Bone Exposed: No Treatment Notes Wound #39 (Calcaneus) Wound Laterality: Right Cleanser Wound Cleanser Discharge Instruction: Cleanse the wound with wound cleanser prior to applying a clean dressing using gauze sponges, not tissue or cotton balls. Soap and Water Discharge Instruction: May shower and wash wound with dial antibacterial soap and water prior to dressing change. Peri-Wound Care Triamcinolone 15 (g) Discharge Instruction: Use triamcinolone 15 (g) as directed Sween Lotion (Moisturizing lotion) Discharge Instruction: Apply moisturizing lotion as directed Topical Primary  Dressing PolyMem Silver Non-Adhesive Dressing, 4.25x4.25 in Discharge Instruction: Apply to wound bed as instructed Secondary Dressing Woven Gauze Sponge, Non-Sterile 4x4 in Discharge Instruction: Apply over primary dressing as directed. ALLEVYN Heel 4 1/2in x 5 1/2in / 10.5cm x 13.5cm Discharge Instruction: Apply over primary dressing as directed. Secured With Compression Wrap Kerlix Roll 4.5x3.1 (in/yd) Discharge Instruction: Apply Kerlix and Coban compression as directed. Coban Self-Adherent Wrap 4x5 (in/yd) Discharge Instruction: Apply over Kerlix as directed. Compression Stockings Add-Ons Electronic Signature(s) Signed: 02/21/2021 4:48:23 PM By: [name] Entered By: [name] on 02/21/2021 11:07:19 -------------------------------------------------------------------------------- Wound Assessment Details  [name] [name], [name] [name] ([id number]) Visit Report for 02/21/2021 Arrival Information Details Patient Name: Date of Service: [name] [name] [facility] F. 02/21/2021 10:30 A M Medical Record Number: [id number] Patient Account Number: [id number] Date of Birth/Sex: Treating RN: [birth year] (75 y.o. [name] [name], [name] [name] Primary Care Kirston Luty: [name], [name] Other Clinician: Referring Lamin Chandley: Treating Kalib Bhagat/Extender: [name], [name] [name], [name] Weeks in Treatment: 253 Visit Information History Since Last Visit Added or deleted any medications: No Patient Arrived: Wheel Chair Any new allergies or adverse reactions: No Arrival Time: 10:47 Had a fall or experienced change in No Accompanied By: daughter activities of daily living that may affect Transfer Assistance: Manual risk of falls: Patient Identification Verified: Yes Signs or symptoms of abuse/neglect since last visito No Secondary Verification Process Completed: Yes Hospitalized since last visit: No Patient Requires Transmission-Based Precautions: No Implantable device outside of the clinic excluding No Patient Has Alerts: Yes cellular tissue based products placed in the center Patient Alerts: Patient on Blood Thinner since last visit: left ABI 1.0; rt ABI 1.09 Has Dressing in Place as Prescribed: Yes Has Compression in Place as Prescribed: Yes Pain Present Now: No Electronic Signature(s) Signed: 02/21/2021 4:48:23 PM By: [name] Entered By: [name] on 02/21/2021 10:52:38 -------------------------------------------------------------------------------- Encounter Discharge Information Details Patient Name: Date of Service: [name] Foley F. 02/21/2021 10:30 A M Medical Record Number: [id number] Patient Account Number: [id number] Date of Birth/Sex: Treating RN: June 25, 1945 (75 y.o. [name] [name], [name] [name] Primary Care Oniyah Rohe: [name], [name] Other Clinician: Referring Abdel Effinger: Treating Kathan Kirker/Extender: [name],  [name] [name], [name] Weeks in Treatment: 253 Encounter Discharge Information Items Post Procedure Vitals Discharge Condition: Stable Temperature (F): 97.4 Ambulatory Status: Wheelchair Pulse (bpm): 74 Discharge Destination: Home Respiratory Rate (breaths/min): 17 Transportation: Private Auto Blood Pressure (mmHg): 134/73 Accompanied By: self Schedule Follow-up Appointment: Yes Clinical Summary of Care: Patient Declined Electronic Signature(s) Signed: 02/21/2021 4:48:23 PM By: [name] Entered By: [name] on 02/21/2021 12:13:06 -------------------------------------------------------------------------------- Lower Extremity Assessment Details Patient Name: Date of Service: [name] Foley F. 02/21/2021 10:30 A M Medical Record Number: [id number] Patient Account Number: [id number] Date of Birth/Sex: Treating RN: [birth year] (75 y.o. [name] [name], [name] [name] Primary Care Shirlene Andaya: [name], [name] Other Clinician: Referring Kj Imbert: Treating Sriya Kroeze/Extender: [name], [name] [name], [name] Weeks in Treatment: 253 Edema Assessment Assessed: Shirlyn Goltz: No] Patrice Paradise: Yes] Edema: [Left: Ye] [Right: s] Calf Left: Right: Point of Measurement: 36 cm From Medial Instep 26 cm Ankle Left: Right: Point of Measurement: 11 cm From Medial Instep 21.5 cm Vascular Assessment Pulses: Dorsalis Pedis Palpable: [Right:Yes] Posterior Tibial Palpable: [Right:Yes] Electronic Signature(s) Signed: 02/21/2021 4:48:23 PM By: [name] Entered By: [name] on 02/21/2021 10:53:43 -------------------------------------------------------------------------------- Multi Wound Chart Details Patient Name: Date of Service: [name] Foley F. 02/21/2021 10:30 A M Medical Record Number: [id number] Patient Account Number: [id number] Date of Birth/Sex: Treating RN: [birth year] (75 y.o. [name] [name], [name] [name] Primary Care Deondrae Mcgrail: [name], [name] Other Clinician: Referring  Elzie Sheets: Treating Jurnie Garritano/Extender: [name], [name] [name], [name] Weeks in Treatment: 253 Vital Signs Height(in): 74 Capillary Blood Glucose(mg/dl): 118 Weight(lbs): 150 Pulse(bpm): 64 Body Mass Index(BMI): 19 Blood Pressure(mmHg): 133/74 Temperature(F): 98.7 Respiratory Rate(breaths/min): 17 Photos: [25:Right, Lateral Lower Leg] [34:No Photos Right, Medial, Dorsal Foot] [39:No Photos Right Calcaneus] Wound Location: [25:Gradually Appeared] [34:Gradually Appeared] [39:Pressure Injury] Wounding Event: [25:Venous Leg Ulcer] [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 02/21/2021 10:30 A M Medical Record Number: [id number] Patient Account Number: [id number] Date of Birth/Sex: Treating RN: [birth year] (75 y.o. [name] [name], [name] [name] Primary Care Kirston Luty: [name], [name] Other Clinician: Referring Lamin Chandley: Treating Kalib Bhagat/Extender: [name], [name] [name], [name] Weeks in Treatment: 253 Visit Information History Since Last Visit Added or deleted any medications: No Patient Arrived: Wheel Chair Any new allergies or adverse reactions: No Arrival Time: 10:47 Had a fall or experienced change in No Accompanied By: daughter activities of daily living that may affect Transfer Assistance: Manual risk of falls: Patient Identification Verified: Yes Signs or symptoms of abuse/neglect since last visito No Secondary Verification Process Completed: Yes Hospitalized since last visit: No Patient Requires Transmission-Based Precautions: No Implantable device outside of the clinic excluding No Patient Has Alerts: Yes cellular tissue based products placed in the center Patient Alerts: Patient on Blood Thinner since last visit: left ABI 1.0; rt ABI 1.09 Has Dressing in Place as Prescribed: Yes Has Compression in Place as Prescribed: Yes Pain Present Now: No Electronic Signature(s) Signed: 02/21/2021 4:48:23 PM By: [name] Entered By: [name] on 02/21/2021 10:52:38 -------------------------------------------------------------------------------- Encounter Discharge Information Details Patient Name: Date of Service: [name] Foley F. 02/21/2021 10:30 A M Medical Record Number: [id number] Patient Account Number: [id number] Date of Birth/Sex: Treating RN: June 25, 1945 (75 y.o. [name] [name], [name] [name] Primary Care Oniyah Rohe: [name], [name] Other Clinician: Referring Abdel Effinger: Treating Kathan Kirker/Extender: [name],  [name] [name], [name] Weeks in Treatment: 253 Encounter Discharge Information Items Post Procedure Vitals Discharge Condition: Stable Temperature (F): 97.4 Ambulatory Status: Wheelchair Pulse (bpm): 74 Discharge Destination: Home Respiratory Rate (breaths/min): 17 Transportation: Private Auto Blood Pressure (mmHg): 134/73 Accompanied By: self Schedule Follow-up Appointment: Yes Clinical Summary of Care: Patient Declined Electronic Signature(s) Signed: 02/21/2021 4:48:23 PM By: [name] Entered By: [name] on 02/21/2021 12:13:06 -------------------------------------------------------------------------------- Lower Extremity Assessment Details Patient Name: Date of Service: [name] Foley F. 02/21/2021 10:30 A M Medical Record Number: [id number] Patient Account Number: [id number] Date of Birth/Sex: Treating RN: [birth year] (75 y.o. [name] [name], [name] [name] Primary Care Shirlene Andaya: [name], [name] Other Clinician: Referring Kj Imbert: Treating Sriya Kroeze/Extender: [name], [name] [name], [name] Weeks in Treatment: 253 Edema Assessment Assessed: Shirlyn Goltz: No] Patrice Paradise: Yes] Edema: [Left: Ye] [Right: s] Calf Left: Right: Point of Measurement: 36 cm From Medial Instep 26 cm Ankle Left: Right: Point of Measurement: 11 cm From Medial Instep 21.5 cm Vascular Assessment Pulses: Dorsalis Pedis Palpable: [Right:Yes] Posterior Tibial Palpable: [Right:Yes] Electronic Signature(s) Signed: 02/21/2021 4:48:23 PM By: [name] Entered By: [name] on 02/21/2021 10:53:43 -------------------------------------------------------------------------------- Multi Wound Chart Details Patient Name: Date of Service: [name] Foley F. 02/21/2021 10:30 A M Medical Record Number: [id number] Patient Account Number: [id number] Date of Birth/Sex: Treating RN: [birth year] (75 y.o. [name] [name], [name] [name] Primary Care Deondrae Mcgrail: [name], [name] Other Clinician: Referring  Elzie Sheets: Treating Jurnie Garritano/Extender: [name], [name] [name], [name] Weeks in Treatment: 253 Vital Signs Height(in): 74 Capillary Blood Glucose(mg/dl): 118 Weight(lbs): 150 Pulse(bpm): 64 Body Mass Index(BMI): 19 Blood Pressure(mmHg): 133/74 Temperature(F): 98.7 Respiratory Rate(breaths/min): 17 Photos: [25:Right, Lateral Lower Leg] [34:No Photos Right, Medial, Dorsal Foot] [39:No Photos Right Calcaneus] Wound Location: [25:Gradually Appeared] [34:Gradually Appeared] [39:Pressure Injury] Wounding Event: [25:Venous Leg Ulcer] [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 02/21/2021 10:30 A M Medical Record Number: [id number] Patient Account Number: [id number] Date of Birth/Sex: Treating RN: [birth year] (75 y.o. [name] [name], [name] [name] Primary Care Kirston Luty: [name], [name] Other Clinician: Referring Lamin Chandley: Treating Kalib Bhagat/Extender: [name], [name] [name], [name] Weeks in Treatment: 253 Visit Information History Since Last Visit Added or deleted any medications: No Patient Arrived: Wheel Chair Any new allergies or adverse reactions: No Arrival Time: 10:47 Had a fall or experienced change in No Accompanied By: daughter activities of daily living that may affect Transfer Assistance: Manual risk of falls: Patient Identification Verified: Yes Signs or symptoms of abuse/neglect since last visito No Secondary Verification Process Completed: Yes Hospitalized since last visit: No Patient Requires Transmission-Based Precautions: No Implantable device outside of the clinic excluding No Patient Has Alerts: Yes cellular tissue based products placed in the center Patient Alerts: Patient on Blood Thinner since last visit: left ABI 1.0; rt ABI 1.09 Has Dressing in Place as Prescribed: Yes Has Compression in Place as Prescribed: Yes Pain Present Now: No Electronic Signature(s) Signed: 02/21/2021 4:48:23 PM By: [name] Entered By: [name] on 02/21/2021 10:52:38 -------------------------------------------------------------------------------- Encounter Discharge Information Details Patient Name: Date of Service: [name] Foley F. 02/21/2021 10:30 A M Medical Record Number: [id number] Patient Account Number: [id number] Date of Birth/Sex: Treating RN: June 25, 1945 (75 y.o. [name] [name], [name] [name] Primary Care Oniyah Rohe: [name], [name] Other Clinician: Referring Abdel Effinger: Treating Kathan Kirker/Extender: [name],  [name] [name], [name] Weeks in Treatment: 253 Encounter Discharge Information Items Post Procedure Vitals Discharge Condition: Stable Temperature (F): 97.4 Ambulatory Status: Wheelchair Pulse (bpm): 74 Discharge Destination: Home Respiratory Rate (breaths/min): 17 Transportation: Private Auto Blood Pressure (mmHg): 134/73 Accompanied By: self Schedule Follow-up Appointment: Yes Clinical Summary of Care: Patient Declined Electronic Signature(s) Signed: 02/21/2021 4:48:23 PM By: [name] Entered By: [name] on 02/21/2021 12:13:06 -------------------------------------------------------------------------------- Lower Extremity Assessment Details Patient Name: Date of Service: [name] Foley F. 02/21/2021 10:30 A M Medical Record Number: [id number] Patient Account Number: [id number] Date of Birth/Sex: Treating RN: [birth year] (75 y.o. [name] [name], [name] [name] Primary Care Shirlene Andaya: [name], [name] Other Clinician: Referring Kj Imbert: Treating Sriya Kroeze/Extender: [name], [name] [name], [name] Weeks in Treatment: 253 Edema Assessment Assessed: Shirlyn Goltz: No] Patrice Paradise: Yes] Edema: [Left: Ye] [Right: s] Calf Left: Right: Point of Measurement: 36 cm From Medial Instep 26 cm Ankle Left: Right: Point of Measurement: 11 cm From Medial Instep 21.5 cm Vascular Assessment Pulses: Dorsalis Pedis Palpable: [Right:Yes] Posterior Tibial Palpable: [Right:Yes] Electronic Signature(s) Signed: 02/21/2021 4:48:23 PM By: [name] Entered By: [name] on 02/21/2021 10:53:43 -------------------------------------------------------------------------------- Multi Wound Chart Details Patient Name: Date of Service: [name] Foley F. 02/21/2021 10:30 A M Medical Record Number: [id number] Patient Account Number: [id number] Date of Birth/Sex: Treating RN: [birth year] (75 y.o. [name] [name], [name] [name] Primary Care Deondrae Mcgrail: [name], [name] Other Clinician: Referring  Elzie Sheets: Treating Jurnie Garritano/Extender: [name], [name] [name], [name] Weeks in Treatment: 253 Vital Signs Height(in): 74 Capillary Blood Glucose(mg/dl): 118 Weight(lbs): 150 Pulse(bpm): 64 Body Mass Index(BMI): 19 Blood Pressure(mmHg): 133/74 Temperature(F): 98.7 Respiratory Rate(breaths/min): 17 Photos: [25:Right, Lateral Lower Leg] [34:No Photos Right, Medial, Dorsal Foot] [39:No Photos Right Calcaneus] Wound Location: [25:Gradually Appeared] [34:Gradually Appeared] [39:Pressure Injury] Wounding Event: [25:Venous Leg Ulcer] [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

## 2021-02-21 NOTE — Progress Notes (Signed)
Coban Self-Adherent Wrap 4x5 (in/yd) (Generic) Every Other Day/30 Days Discharge Instructions: Apply over Kerlix as directed. WOUND #41: - Lower Leg Wound Laterality: Right, Anterior, Distal Cleanser: Wound Cleanser (Generic) Every Other Day/30 Days Discharge Instructions: Cleanse the wound with wound cleanser prior to applying a clean dressing using gauze sponges, not tissue or cotton balls. Cleanser: Soap and Water Every Other Day/30 Days Discharge Instructions: May shower and wash wound with dial antibacterial soap and water prior to dressing change. Peri-Wound Care: Triamcinolone 15 (g) Every Other Day/30 Days Discharge Instructions: Use triamcinolone 15 (g) as directed Peri-Wound Care: Sween Lotion (Moisturizing lotion) Every Other Day/30 Days Discharge Instructions: Apply moisturizing lotion as directed Prim Dressing: PolyMem Silver Non-Adhesive Dressing, 4.25x4.25 in Every Other Day/30 Days ary Discharge Instructions: Apply to wound bed as instructed Secondary Dressing: Woven Gauze Sponge, Non-Sterile 4x4 in (Generic) Every Other Day/30 Days Discharge Instructions: Apply over primary dressing as directed. Com pression Wrap: Kerlix Roll 4.5x3.1 (in/yd) (Generic) Every Other Day/30 Days Discharge Instructions: Apply Kerlix and Coban compression as directed. Com pression Wrap: Coban Self-Adherent Wrap 4x5 (in/yd) (Generic) Every Other Day/30 Days Discharge Instructions: Apply over Kerlix as directed. WOUND #42: - Lower Leg Wound Laterality: Right, Anterior,  Proximal Cleanser: Wound Cleanser (Generic) Every Other Day/30 Days Discharge Instructions: Cleanse the wound with wound cleanser prior to applying a clean dressing using gauze sponges, not tissue or cotton balls. Cleanser: Soap and Water Every Other Day/30 Days Discharge Instructions: May shower and wash wound with dial antibacterial soap and water prior to dressing change. Peri-Wound Care: Triamcinolone 15 (g) Every Other Day/30 Days Discharge Instructions: Use triamcinolone 15 (g) as directed Peri-Wound Care: Sween Lotion (Moisturizing lotion) Every Other Day/30 Days Discharge Instructions: Apply moisturizing lotion as directed Prim Dressing: PolyMem Silver Non-Adhesive Dressing, 4.25x4.25 in Every Other Day/30 Days ary Discharge Instructions: Apply to wound bed as instructed Secondary Dressing: Woven Gauze Sponge, Non-Sterile 4x4 in (Generic) Every Other Day/30 Days Discharge Instructions: Apply over primary dressing as directed. Com pression Wrap: Kerlix Roll 4.5x3.1 (in/yd) (Generic) Every Other Day/30 Days Discharge Instructions: Apply Kerlix and Coban compression as directed. Com pression Wrap: Coban Self-Adherent Wrap 4x5 (in/yd) (Generic) Every Other Day/30 Days Discharge Instructions: Apply over Kerlix as directed. 1. We are using polymen Ag to the heel right lateral calf 2. Silver alginate to the dorsal foot which is a lot worse today. 3. He is going to require a CT scan of the right foot rule out osteomyelitis 4. Vascular surgery seems to be saying that the patient's popliteal artery and posterior tibial artery angioplasties are patent and that he should have enough blood flow to heal these wounds. Notable for the fact that his TBI was quite normal Electronic Signature(s) Signed: 02/21/2021 4:29:54 PM By: Linton Ham MD Entered By: Linton Ham on 02/21/2021 12:41:45 -------------------------------------------------------------------------------- SuperBill  Details Patient Name: Date of Service: [name] Foley F. 02/21/2021 Medical Record Number: [id number] Patient Account Number: [id number] Date of Birth/Sex: Treating RN: 03-23-1946 (75 y.o. [name] [name], [name] [name] Primary Care Provider: [name], [name] Other Clinician: Referring Provider: Treating Provider/Extender: Diago Haik [name], [name] Weeks in Treatment: 253 Diagnosis Coding ICD-10 Codes Code Description E11.621 Type 2 diabetes mellitus with foot ulcer L97.518 Non-pressure chronic ulcer of other part of right foot with other specified severity L97.218 Non-pressure chronic ulcer of right calf with other specified severity S80.211D Abrasion, right knee, subsequent encounter L97.418 Non-pressure chronic ulcer of right heel and midfoot with other specified severity S80.211D Abrasion, right knee, subsequent encounter Facility Procedures  g) Every Other Day/30 Days Discharge Instructions: Use triamcinolone 15 (g) as directed Peri-Wound Care: Sween Lotion (Moisturizing lotion) Every Other Day/30 Days Discharge Instructions: Apply moisturizing lotion as directed Prim Dressing: PolyMem Silver Non-Adhesive Dressing, 4.25x4.25 in Every Other Day/30 Days ary Discharge Instructions: Apply to wound bed as instructed Secondary Dressing: Woven Gauze Sponge, Non-Sterile 4x4 in (Generic) Every Other Day/30 Days Discharge Instructions: Apply over primary dressing as directed. Compression Wrap: Kerlix Roll 4.5x3.1 (in/yd) (Generic) Every Other Day/30 Days Discharge Instructions: Apply Kerlix and Coban compression as directed. Compression Wrap: Coban Self-Adherent Wrap 4x5 (in/yd) (Generic) Every Other Day/30 Days Discharge Instructions: Apply over Kerlix as directed. Wound #42 - Lower Leg Wound Laterality: Right,  Anterior, Proximal Cleanser: Wound Cleanser (Generic) Every Other Day/30 Days Discharge Instructions: Cleanse the wound with wound cleanser prior to applying a clean dressing using gauze sponges, not tissue or cotton balls. Cleanser: Soap and Water Every Other Day/30 Days Discharge Instructions: May shower and wash wound with dial antibacterial soap and water prior to dressing change. Peri-Wound Care: Triamcinolone 15 (g) Every Other Day/30 Days Discharge Instructions: Use triamcinolone 15 (g) as directed Peri-Wound Care: Sween Lotion (Moisturizing lotion) Every Other Day/30 Days Discharge Instructions: Apply moisturizing lotion as directed Prim Dressing: PolyMem Silver Non-Adhesive Dressing, 4.25x4.25 in Every Other Day/30 Days ary Discharge Instructions: Apply to wound bed as instructed Secondary Dressing: Woven Gauze Sponge, Non-Sterile 4x4 in (Generic) Every Other Day/30 Days Discharge Instructions: Apply over primary dressing as directed. Compression Wrap: Kerlix Roll 4.5x3.1 (in/yd) (Generic) Every Other Day/30 Days Discharge Instructions: Apply Kerlix and Coban compression as directed. Compression Wrap: Coban Self-Adherent Wrap 4x5 (in/yd) (Generic) Every Other Day/30 Days Discharge Instructions: Apply over Kerlix as directed. Laboratory naerobe culture (MICRO) - right medial foot Bacteria identified in Unspecified specimen by A LOINC Code: 417-4 Convenience Name: Anerobic culture Radiology Computed Tomography (CT) Scan - WITHOUT CONTRAST RIGHT MEDIAL FOOT ; Electronic Signature(s) Signed: 02/21/2021 4:29:54 PM By: Linton Ham MD Signed: 02/21/2021 4:48:23 PM By: [name] Entered By: [name] on 02/21/2021 11:42:18 Prescription 02/21/2021 -------------------------------------------------------------------------------- [name] MD Patient Name: Provider: [birth year] [id number] Date of Birth: NPI#[name] [id number] Sex: DEA  #: [phone] Phone #: License #: [place] Patient Address: [address], [place] [address] [place], [place] [address] [phone] Allergies Sulfa Provider's Orders Computed Tomography (CT) Scan - WITHOUT CONTRAST RIGHT MEDIAL FOOT ; Hand Signature: Date(s): Electronic Signature(s) Signed: 02/21/2021 4:29:54 PM By: Linton Ham MD Signed: 02/21/2021 4:48:23 PM By: [name] Entered By: [name] on 02/21/2021 11:42:27 -------------------------------------------------------------------------------- Problem List Details Patient Name: Date of Service: [name] Foley F. 02/21/2021 10:30 A M Medical Record Number: [id number] Patient Account Number: [id number] Date of Birth/Sex: Treating RN: 1945-07-08 (75 y.o. 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 11/01/2020 No Yes S80.211D Abrasion, right knee, subsequent encounter 11/01/2020 No Yes 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 WOUND #41: - Lower Leg Wound Laterality: Right, Anterior, Distal Cleanser: Wound Cleanser (Generic) Every Other Day/30 Days Discharge Instructions: Cleanse the wound with wound cleanser prior to applying a clean dressing using gauze sponges, not tissue or cotton balls. Cleanser: Soap and Water Every Other Day/30 Days Discharge Instructions: May shower and wash wound with dial antibacterial soap and water prior to dressing change. Peri-Wound Care: Triamcinolone 15 (g) Every Other Day/30 Days Discharge Instructions: Use triamcinolone 15 (g) as directed Peri-Wound Care: Sween Lotion (Moisturizing lotion) Every Other Day/30 Days Discharge Instructions: Apply moisturizing lotion as directed Prim Dressing: PolyMem Silver Non-Adhesive Dressing, 4.25x4.25 in Every Other Day/30 Days ary Discharge Instructions: Apply to wound bed as instructed Secondary Dressing: Woven Gauze Sponge, Non-Sterile 4x4 in (Generic) Every Other Day/30 Days Discharge Instructions: Apply over primary dressing as directed. Com pression Wrap: Kerlix Roll 4.5x3.1 (in/yd) (Generic) Every Other Day/30 Days Discharge Instructions: Apply Kerlix and Coban compression as directed. Com pression Wrap: Coban Self-Adherent Wrap 4x5 (in/yd) (Generic) Every Other Day/30 Days Discharge Instructions: Apply over Kerlix as directed. WOUND #42: - Lower Leg Wound Laterality: Right, Anterior,  Proximal Cleanser: Wound Cleanser (Generic) Every Other Day/30 Days Discharge Instructions: Cleanse the wound with wound cleanser prior to applying a clean dressing using gauze sponges, not tissue or cotton balls. Cleanser: Soap and Water Every Other Day/30 Days Discharge Instructions: May shower and wash wound with dial antibacterial soap and water prior to dressing change. Peri-Wound Care: Triamcinolone 15 (g) Every Other Day/30 Days Discharge Instructions: Use triamcinolone 15 (g) as directed Peri-Wound Care: Sween Lotion (Moisturizing lotion) Every Other Day/30 Days Discharge Instructions: Apply moisturizing lotion as directed Prim Dressing: PolyMem Silver Non-Adhesive Dressing, 4.25x4.25 in Every Other Day/30 Days ary Discharge Instructions: Apply to wound bed as instructed Secondary Dressing: Woven Gauze Sponge, Non-Sterile 4x4 in (Generic) Every Other Day/30 Days Discharge Instructions: Apply over primary dressing as directed. Com pression Wrap: Kerlix Roll 4.5x3.1 (in/yd) (Generic) Every Other Day/30 Days Discharge Instructions: Apply Kerlix and Coban compression as directed. Com pression Wrap: Coban Self-Adherent Wrap 4x5 (in/yd) (Generic) Every Other Day/30 Days Discharge Instructions: Apply over Kerlix as directed. 1. We are using polymen Ag to the heel right lateral calf 2. Silver alginate to the dorsal foot which is a lot worse today. 3. He is going to require a CT scan of the right foot rule out osteomyelitis 4. Vascular surgery seems to be saying that the patient's popliteal artery and posterior tibial artery angioplasties are patent and that he should have enough blood flow to heal these wounds. Notable for the fact that his TBI was quite normal Electronic Signature(s) Signed: 02/21/2021 4:29:54 PM By: Linton Ham MD Entered By: Linton Ham on 02/21/2021 12:41:45 -------------------------------------------------------------------------------- SuperBill  Details Patient Name: Date of Service: [name] Foley F. 02/21/2021 Medical Record Number: [id number] Patient Account Number: [id number] Date of Birth/Sex: Treating RN: 03-23-1946 (75 y.o. [name] [name], [name] [name] Primary Care Provider: [name], [name] Other Clinician: Referring Provider: Treating Provider/Extender: Diago Haik [name], [name] Weeks in Treatment: 253 Diagnosis Coding ICD-10 Codes Code Description E11.621 Type 2 diabetes mellitus with foot ulcer L97.518 Non-pressure chronic ulcer of other part of right foot with other specified severity L97.218 Non-pressure chronic ulcer of right calf with other specified severity S80.211D Abrasion, right knee, subsequent encounter L97.418 Non-pressure chronic ulcer of right heel and midfoot with other specified severity S80.211D Abrasion, right knee, subsequent encounter Facility Procedures  Inactive Date L89.613 Pressure ulcer of right heel, stage 3 04/17/2016 04/17/2016 L97.221 Non-pressure chronic ulcer of left calf limited to breakdown of skin 08/15/2017 08/15/2017 L97.321 Non-pressure chronic ulcer of left ankle limited to breakdown  of skin 07/01/2018 07/01/2018 L03.032 Cellulitis of left toe 01/06/2019 01/06/2019 Z32.992 Pressure ulcer of left heel, stage 3 12/04/2016 12/04/2016 I25.119 Atherosclerotic heart disease of native coronary artery with unspecified angina 04/17/2016 04/17/2016 pectoris L97.821 Non-pressure chronic ulcer of other part of left lower leg limited to breakdown of skin 01/06/2019 01/06/2019 E26.83 Acute diastolic (congestive) heart failure 04/17/2016 04/17/2016 L97.521 Non-pressure chronic ulcer of other part of left foot limited to breakdown of skin 02/17/2019 02/17/2019 S51.811D Laceration without foreign body of right forearm, subsequent encounter 10/22/2017 10/22/2017 L03.116 Cellulitis of left lower limb 12/24/2017 12/24/2017 L89.620 Pressure ulcer of left heel, unstageable 07/21/2019 07/21/2019 L97.524 Non-pressure chronic ulcer of other part of left foot with necrosis of bone 06/14/2020 06/14/2020 L97.211 Non-pressure chronic ulcer of right calf limited to breakdown of skin 07/21/2019 07/21/2019 M86.172 Other acute osteomyelitis, left ankle and foot 08/30/2020 08/30/2020 S80.211D Abrasion, right knee, subsequent encounter 08/18/2019 08/18/2019 Resolved Problems ICD-10 Code Description Active Date Resolved Date L89.512 Pressure ulcer of right ankle, stage 2 04/17/2016 04/17/2016 L89.522 Pressure ulcer of left ankle, stage 2 04/17/2016 04/17/2016 Electronic Signature(s) Signed: 02/21/2021 4:29:54 PM By: Linton Ham MD Entered By: Linton Ham on 02/21/2021 12:37:16 -------------------------------------------------------------------------------- Progress Note Details Patient Name: Date of Service: [name] Foley F. 02/21/2021 10:30 A M Medical Record Number: [id number] Patient Account Number: [id number] Date of Birth/Sex: Treating RN: [birth year] (75 y.o. [name] [name], [name] [name] Primary Care Provider: [name], [name] Other Clinician: Referring Provider: Treating Provider/Extender: Mavrick Mcquigg [name],  [name] Weeks in Treatment: 253 Subjective History of Present Illness (HPI) The following HPI elements were documented for the patient's wound: Location: On the left and right lateral forefoot which has been there for about 6 months Quality: Patient reports No Pain. Severity: Patient states wound(s) are getting worse. Duration: Patient has had the wound for > 6 months prior to seeking treatment at the wound center Context: The wound would happen gradually Modifying Factors: Patient wound(s)/ulcer(s) are worsening due to :continual drainage from the wound Associated Signs and Symptoms: Patient reports having increase discharge. This patient returns after being seen here till the end of August and he was lost to follow-up. he has been quite debilitated laying in bed most of the time and his condition has deteriorated significantly. He has multiple ulcerations on the heel lateral forefoot and some of his toes. ======== Old notes: 75 year old male known to our practice when he was seen here in February and March and was lost to follow-up when he was admitted to hospital with various medical problems including coronary artery disease and a stroke. Now returns with the problem on the left forefoot where he has an ulceration and this has been there for about 6 months. 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 He has had persistent hyperglycemia given recently. Patient has a past medical history of diabetes mellitus, hypertension,  combined systolic and diastolic heart failure, peripheral  Inactive Date L89.613 Pressure ulcer of right heel, stage 3 04/17/2016 04/17/2016 L97.221 Non-pressure chronic ulcer of left calf limited to breakdown of skin 08/15/2017 08/15/2017 L97.321 Non-pressure chronic ulcer of left ankle limited to breakdown  of skin 07/01/2018 07/01/2018 L03.032 Cellulitis of left toe 01/06/2019 01/06/2019 Z32.992 Pressure ulcer of left heel, stage 3 12/04/2016 12/04/2016 I25.119 Atherosclerotic heart disease of native coronary artery with unspecified angina 04/17/2016 04/17/2016 pectoris L97.821 Non-pressure chronic ulcer of other part of left lower leg limited to breakdown of skin 01/06/2019 01/06/2019 E26.83 Acute diastolic (congestive) heart failure 04/17/2016 04/17/2016 L97.521 Non-pressure chronic ulcer of other part of left foot limited to breakdown of skin 02/17/2019 02/17/2019 S51.811D Laceration without foreign body of right forearm, subsequent encounter 10/22/2017 10/22/2017 L03.116 Cellulitis of left lower limb 12/24/2017 12/24/2017 L89.620 Pressure ulcer of left heel, unstageable 07/21/2019 07/21/2019 L97.524 Non-pressure chronic ulcer of other part of left foot with necrosis of bone 06/14/2020 06/14/2020 L97.211 Non-pressure chronic ulcer of right calf limited to breakdown of skin 07/21/2019 07/21/2019 M86.172 Other acute osteomyelitis, left ankle and foot 08/30/2020 08/30/2020 S80.211D Abrasion, right knee, subsequent encounter 08/18/2019 08/18/2019 Resolved Problems ICD-10 Code Description Active Date Resolved Date L89.512 Pressure ulcer of right ankle, stage 2 04/17/2016 04/17/2016 L89.522 Pressure ulcer of left ankle, stage 2 04/17/2016 04/17/2016 Electronic Signature(s) Signed: 02/21/2021 4:29:54 PM By: Linton Ham MD Entered By: Linton Ham on 02/21/2021 12:37:16 -------------------------------------------------------------------------------- Progress Note Details Patient Name: Date of Service: [name] Foley F. 02/21/2021 10:30 A M Medical Record Number: [id number] Patient Account Number: [id number] Date of Birth/Sex: Treating RN: [birth year] (75 y.o. [name] [name], [name] [name] Primary Care Provider: [name], [name] Other Clinician: Referring Provider: Treating Provider/Extender: Mavrick Mcquigg [name],  [name] Weeks in Treatment: 253 Subjective History of Present Illness (HPI) The following HPI elements were documented for the patient's wound: Location: On the left and right lateral forefoot which has been there for about 6 months Quality: Patient reports No Pain. Severity: Patient states wound(s) are getting worse. Duration: Patient has had the wound for > 6 months prior to seeking treatment at the wound center Context: The wound would happen gradually Modifying Factors: Patient wound(s)/ulcer(s) are worsening due to :continual drainage from the wound Associated Signs and Symptoms: Patient reports having increase discharge. This patient returns after being seen here till the end of August and he was lost to follow-up. he has been quite debilitated laying in bed most of the time and his condition has deteriorated significantly. He has multiple ulcerations on the heel lateral forefoot and some of his toes. ======== Old notes: 75 year old male known to our practice when he was seen here in February and March and was lost to follow-up when he was admitted to hospital with various medical problems including coronary artery disease and a stroke. Now returns with the problem on the left forefoot where he has an ulceration and this has been there for about 6 months. 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 He has had persistent hyperglycemia given recently. Patient has a past medical history of diabetes mellitus, hypertension,  combined systolic and diastolic heart failure, peripheral  Inactive Date L89.613 Pressure ulcer of right heel, stage 3 04/17/2016 04/17/2016 L97.221 Non-pressure chronic ulcer of left calf limited to breakdown of skin 08/15/2017 08/15/2017 L97.321 Non-pressure chronic ulcer of left ankle limited to breakdown  of skin 07/01/2018 07/01/2018 L03.032 Cellulitis of left toe 01/06/2019 01/06/2019 Z32.992 Pressure ulcer of left heel, stage 3 12/04/2016 12/04/2016 I25.119 Atherosclerotic heart disease of native coronary artery with unspecified angina 04/17/2016 04/17/2016 pectoris L97.821 Non-pressure chronic ulcer of other part of left lower leg limited to breakdown of skin 01/06/2019 01/06/2019 E26.83 Acute diastolic (congestive) heart failure 04/17/2016 04/17/2016 L97.521 Non-pressure chronic ulcer of other part of left foot limited to breakdown of skin 02/17/2019 02/17/2019 S51.811D Laceration without foreign body of right forearm, subsequent encounter 10/22/2017 10/22/2017 L03.116 Cellulitis of left lower limb 12/24/2017 12/24/2017 L89.620 Pressure ulcer of left heel, unstageable 07/21/2019 07/21/2019 L97.524 Non-pressure chronic ulcer of other part of left foot with necrosis of bone 06/14/2020 06/14/2020 L97.211 Non-pressure chronic ulcer of right calf limited to breakdown of skin 07/21/2019 07/21/2019 M86.172 Other acute osteomyelitis, left ankle and foot 08/30/2020 08/30/2020 S80.211D Abrasion, right knee, subsequent encounter 08/18/2019 08/18/2019 Resolved Problems ICD-10 Code Description Active Date Resolved Date L89.512 Pressure ulcer of right ankle, stage 2 04/17/2016 04/17/2016 L89.522 Pressure ulcer of left ankle, stage 2 04/17/2016 04/17/2016 Electronic Signature(s) Signed: 02/21/2021 4:29:54 PM By: Linton Ham MD Entered By: Linton Ham on 02/21/2021 12:37:16 -------------------------------------------------------------------------------- Progress Note Details Patient Name: Date of Service: [name] Foley F. 02/21/2021 10:30 A M Medical Record Number: [id number] Patient Account Number: [id number] Date of Birth/Sex: Treating RN: [birth year] (75 y.o. [name] [name], [name] [name] Primary Care Provider: [name], [name] Other Clinician: Referring Provider: Treating Provider/Extender: Mavrick Mcquigg [name],  [name] Weeks in Treatment: 253 Subjective History of Present Illness (HPI) The following HPI elements were documented for the patient's wound: Location: On the left and right lateral forefoot which has been there for about 6 months Quality: Patient reports No Pain. Severity: Patient states wound(s) are getting worse. Duration: Patient has had the wound for > 6 months prior to seeking treatment at the wound center Context: The wound would happen gradually Modifying Factors: Patient wound(s)/ulcer(s) are worsening due to :continual drainage from the wound Associated Signs and Symptoms: Patient reports having increase discharge. This patient returns after being seen here till the end of August and he was lost to follow-up. he has been quite debilitated laying in bed most of the time and his condition has deteriorated significantly. He has multiple ulcerations on the heel lateral forefoot and some of his toes. ======== Old notes: 75 year old male known to our practice when he was seen here in February and March and was lost to follow-up when he was admitted to hospital with various medical problems including coronary artery disease and a stroke. Now returns with the problem on the left forefoot where he has an ulceration and this has been there for about 6 months. 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 He has had persistent hyperglycemia given recently. Patient has a past medical history of diabetes mellitus, hypertension,  combined systolic and diastolic heart failure, peripheral  Inactive Date L89.613 Pressure ulcer of right heel, stage 3 04/17/2016 04/17/2016 L97.221 Non-pressure chronic ulcer of left calf limited to breakdown of skin 08/15/2017 08/15/2017 L97.321 Non-pressure chronic ulcer of left ankle limited to breakdown  of skin 07/01/2018 07/01/2018 L03.032 Cellulitis of left toe 01/06/2019 01/06/2019 Z32.992 Pressure ulcer of left heel, stage 3 12/04/2016 12/04/2016 I25.119 Atherosclerotic heart disease of native coronary artery with unspecified angina 04/17/2016 04/17/2016 pectoris L97.821 Non-pressure chronic ulcer of other part of left lower leg limited to breakdown of skin 01/06/2019 01/06/2019 E26.83 Acute diastolic (congestive) heart failure 04/17/2016 04/17/2016 L97.521 Non-pressure chronic ulcer of other part of left foot limited to breakdown of skin 02/17/2019 02/17/2019 S51.811D Laceration without foreign body of right forearm, subsequent encounter 10/22/2017 10/22/2017 L03.116 Cellulitis of left lower limb 12/24/2017 12/24/2017 L89.620 Pressure ulcer of left heel, unstageable 07/21/2019 07/21/2019 L97.524 Non-pressure chronic ulcer of other part of left foot with necrosis of bone 06/14/2020 06/14/2020 L97.211 Non-pressure chronic ulcer of right calf limited to breakdown of skin 07/21/2019 07/21/2019 M86.172 Other acute osteomyelitis, left ankle and foot 08/30/2020 08/30/2020 S80.211D Abrasion, right knee, subsequent encounter 08/18/2019 08/18/2019 Resolved Problems ICD-10 Code Description Active Date Resolved Date L89.512 Pressure ulcer of right ankle, stage 2 04/17/2016 04/17/2016 L89.522 Pressure ulcer of left ankle, stage 2 04/17/2016 04/17/2016 Electronic Signature(s) Signed: 02/21/2021 4:29:54 PM By: Linton Ham MD Entered By: Linton Ham on 02/21/2021 12:37:16 -------------------------------------------------------------------------------- Progress Note Details Patient Name: Date of Service: [name] Foley F. 02/21/2021 10:30 A M Medical Record Number: [id number] Patient Account Number: [id number] Date of Birth/Sex: Treating RN: [birth year] (75 y.o. [name] [name], [name] [name] Primary Care Provider: [name], [name] Other Clinician: Referring Provider: Treating Provider/Extender: Mavrick Mcquigg [name],  [name] Weeks in Treatment: 253 Subjective History of Present Illness (HPI) The following HPI elements were documented for the patient's wound: Location: On the left and right lateral forefoot which has been there for about 6 months Quality: Patient reports No Pain. Severity: Patient states wound(s) are getting worse. Duration: Patient has had the wound for > 6 months prior to seeking treatment at the wound center Context: The wound would happen gradually Modifying Factors: Patient wound(s)/ulcer(s) are worsening due to :continual drainage from the wound Associated Signs and Symptoms: Patient reports having increase discharge. This patient returns after being seen here till the end of August and he was lost to follow-up. he has been quite debilitated laying in bed most of the time and his condition has deteriorated significantly. He has multiple ulcerations on the heel lateral forefoot and some of his toes. ======== Old notes: 75 year old male known to our practice when he was seen here in February and March and was lost to follow-up when he was admitted to hospital with various medical problems including coronary artery disease and a stroke. Now returns with the problem on the left forefoot where he has an ulceration and this has been there for about 6 months. 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 He has had persistent hyperglycemia given recently. Patient has a past medical history of diabetes mellitus, hypertension,  combined systolic and diastolic heart failure, peripheral  Coban Self-Adherent Wrap 4x5 (in/yd) (Generic) Every Other Day/30 Days Discharge Instructions: Apply over Kerlix as directed. WOUND #41: - Lower Leg Wound Laterality: Right, Anterior, Distal Cleanser: Wound Cleanser (Generic) Every Other Day/30 Days Discharge Instructions: Cleanse the wound with wound cleanser prior to applying a clean dressing using gauze sponges, not tissue or cotton balls. Cleanser: Soap and Water Every Other Day/30 Days Discharge Instructions: May shower and wash wound with dial antibacterial soap and water prior to dressing change. Peri-Wound Care: Triamcinolone 15 (g) Every Other Day/30 Days Discharge Instructions: Use triamcinolone 15 (g) as directed Peri-Wound Care: Sween Lotion (Moisturizing lotion) Every Other Day/30 Days Discharge Instructions: Apply moisturizing lotion as directed Prim Dressing: PolyMem Silver Non-Adhesive Dressing, 4.25x4.25 in Every Other Day/30 Days ary Discharge Instructions: Apply to wound bed as instructed Secondary Dressing: Woven Gauze Sponge, Non-Sterile 4x4 in (Generic) Every Other Day/30 Days Discharge Instructions: Apply over primary dressing as directed. Com pression Wrap: Kerlix Roll 4.5x3.1 (in/yd) (Generic) Every Other Day/30 Days Discharge Instructions: Apply Kerlix and Coban compression as directed. Com pression Wrap: Coban Self-Adherent Wrap 4x5 (in/yd) (Generic) Every Other Day/30 Days Discharge Instructions: Apply over Kerlix as directed. WOUND #42: - Lower Leg Wound Laterality: Right, Anterior,  Proximal Cleanser: Wound Cleanser (Generic) Every Other Day/30 Days Discharge Instructions: Cleanse the wound with wound cleanser prior to applying a clean dressing using gauze sponges, not tissue or cotton balls. Cleanser: Soap and Water Every Other Day/30 Days Discharge Instructions: May shower and wash wound with dial antibacterial soap and water prior to dressing change. Peri-Wound Care: Triamcinolone 15 (g) Every Other Day/30 Days Discharge Instructions: Use triamcinolone 15 (g) as directed Peri-Wound Care: Sween Lotion (Moisturizing lotion) Every Other Day/30 Days Discharge Instructions: Apply moisturizing lotion as directed Prim Dressing: PolyMem Silver Non-Adhesive Dressing, 4.25x4.25 in Every Other Day/30 Days ary Discharge Instructions: Apply to wound bed as instructed Secondary Dressing: Woven Gauze Sponge, Non-Sterile 4x4 in (Generic) Every Other Day/30 Days Discharge Instructions: Apply over primary dressing as directed. Com pression Wrap: Kerlix Roll 4.5x3.1 (in/yd) (Generic) Every Other Day/30 Days Discharge Instructions: Apply Kerlix and Coban compression as directed. Com pression Wrap: Coban Self-Adherent Wrap 4x5 (in/yd) (Generic) Every Other Day/30 Days Discharge Instructions: Apply over Kerlix as directed. 1. We are using polymen Ag to the heel right lateral calf 2. Silver alginate to the dorsal foot which is a lot worse today. 3. He is going to require a CT scan of the right foot rule out osteomyelitis 4. Vascular surgery seems to be saying that the patient's popliteal artery and posterior tibial artery angioplasties are patent and that he should have enough blood flow to heal these wounds. Notable for the fact that his TBI was quite normal Electronic Signature(s) Signed: 02/21/2021 4:29:54 PM By: Linton Ham MD Entered By: Linton Ham on 02/21/2021 12:41:45 -------------------------------------------------------------------------------- SuperBill  Details Patient Name: Date of Service: [name] Foley F. 02/21/2021 Medical Record Number: [id number] Patient Account Number: [id number] Date of Birth/Sex: Treating RN: 03-23-1946 (75 y.o. [name] [name], [name] [name] Primary Care Provider: [name], [name] Other Clinician: Referring Provider: Treating Provider/Extender: Diago Haik [name], [name] Weeks in Treatment: 253 Diagnosis Coding ICD-10 Codes Code Description E11.621 Type 2 diabetes mellitus with foot ulcer L97.518 Non-pressure chronic ulcer of other part of right foot with other specified severity L97.218 Non-pressure chronic ulcer of right calf with other specified severity S80.211D Abrasion, right knee, subsequent encounter L97.418 Non-pressure chronic ulcer of right heel and midfoot with other specified severity S80.211D Abrasion, right knee, subsequent encounter Facility Procedures  Inactive Date L89.613 Pressure ulcer of right heel, stage 3 04/17/2016 04/17/2016 L97.221 Non-pressure chronic ulcer of left calf limited to breakdown of skin 08/15/2017 08/15/2017 L97.321 Non-pressure chronic ulcer of left ankle limited to breakdown  of skin 07/01/2018 07/01/2018 L03.032 Cellulitis of left toe 01/06/2019 01/06/2019 Z32.992 Pressure ulcer of left heel, stage 3 12/04/2016 12/04/2016 I25.119 Atherosclerotic heart disease of native coronary artery with unspecified angina 04/17/2016 04/17/2016 pectoris L97.821 Non-pressure chronic ulcer of other part of left lower leg limited to breakdown of skin 01/06/2019 01/06/2019 E26.83 Acute diastolic (congestive) heart failure 04/17/2016 04/17/2016 L97.521 Non-pressure chronic ulcer of other part of left foot limited to breakdown of skin 02/17/2019 02/17/2019 S51.811D Laceration without foreign body of right forearm, subsequent encounter 10/22/2017 10/22/2017 L03.116 Cellulitis of left lower limb 12/24/2017 12/24/2017 L89.620 Pressure ulcer of left heel, unstageable 07/21/2019 07/21/2019 L97.524 Non-pressure chronic ulcer of other part of left foot with necrosis of bone 06/14/2020 06/14/2020 L97.211 Non-pressure chronic ulcer of right calf limited to breakdown of skin 07/21/2019 07/21/2019 M86.172 Other acute osteomyelitis, left ankle and foot 08/30/2020 08/30/2020 S80.211D Abrasion, right knee, subsequent encounter 08/18/2019 08/18/2019 Resolved Problems ICD-10 Code Description Active Date Resolved Date L89.512 Pressure ulcer of right ankle, stage 2 04/17/2016 04/17/2016 L89.522 Pressure ulcer of left ankle, stage 2 04/17/2016 04/17/2016 Electronic Signature(s) Signed: 02/21/2021 4:29:54 PM By: Linton Ham MD Entered By: Linton Ham on 02/21/2021 12:37:16 -------------------------------------------------------------------------------- Progress Note Details Patient Name: Date of Service: [name] Foley F. 02/21/2021 10:30 A M Medical Record Number: [id number] Patient Account Number: [id number] Date of Birth/Sex: Treating RN: [birth year] (75 y.o. [name] [name], [name] [name] Primary Care Provider: [name], [name] Other Clinician: Referring Provider: Treating Provider/Extender: Mavrick Mcquigg [name],  [name] Weeks in Treatment: 253 Subjective History of Present Illness (HPI) The following HPI elements were documented for the patient's wound: Location: On the left and right lateral forefoot which has been there for about 6 months Quality: Patient reports No Pain. Severity: Patient states wound(s) are getting worse. Duration: Patient has had the wound for > 6 months prior to seeking treatment at the wound center Context: The wound would happen gradually Modifying Factors: Patient wound(s)/ulcer(s) are worsening due to :continual drainage from the wound Associated Signs and Symptoms: Patient reports having increase discharge. This patient returns after being seen here till the end of August and he was lost to follow-up. he has been quite debilitated laying in bed most of the time and his condition has deteriorated significantly. He has multiple ulcerations on the heel lateral forefoot and some of his toes. ======== Old notes: 75 year old male known to our practice when he was seen here in February and March and was lost to follow-up when he was admitted to hospital with various medical problems including coronary artery disease and a stroke. Now returns with the problem on the left forefoot where he has an ulceration and this has been there for about 6 months. 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 He has had persistent hyperglycemia given recently. Patient has a past medical history of diabetes mellitus, hypertension,  combined systolic and diastolic heart failure, peripheral  Inactive Date L89.613 Pressure ulcer of right heel, stage 3 04/17/2016 04/17/2016 L97.221 Non-pressure chronic ulcer of left calf limited to breakdown of skin 08/15/2017 08/15/2017 L97.321 Non-pressure chronic ulcer of left ankle limited to breakdown  of skin 07/01/2018 07/01/2018 L03.032 Cellulitis of left toe 01/06/2019 01/06/2019 Z32.992 Pressure ulcer of left heel, stage 3 12/04/2016 12/04/2016 I25.119 Atherosclerotic heart disease of native coronary artery with unspecified angina 04/17/2016 04/17/2016 pectoris L97.821 Non-pressure chronic ulcer of other part of left lower leg limited to breakdown of skin 01/06/2019 01/06/2019 E26.83 Acute diastolic (congestive) heart failure 04/17/2016 04/17/2016 L97.521 Non-pressure chronic ulcer of other part of left foot limited to breakdown of skin 02/17/2019 02/17/2019 S51.811D Laceration without foreign body of right forearm, subsequent encounter 10/22/2017 10/22/2017 L03.116 Cellulitis of left lower limb 12/24/2017 12/24/2017 L89.620 Pressure ulcer of left heel, unstageable 07/21/2019 07/21/2019 L97.524 Non-pressure chronic ulcer of other part of left foot with necrosis of bone 06/14/2020 06/14/2020 L97.211 Non-pressure chronic ulcer of right calf limited to breakdown of skin 07/21/2019 07/21/2019 M86.172 Other acute osteomyelitis, left ankle and foot 08/30/2020 08/30/2020 S80.211D Abrasion, right knee, subsequent encounter 08/18/2019 08/18/2019 Resolved Problems ICD-10 Code Description Active Date Resolved Date L89.512 Pressure ulcer of right ankle, stage 2 04/17/2016 04/17/2016 L89.522 Pressure ulcer of left ankle, stage 2 04/17/2016 04/17/2016 Electronic Signature(s) Signed: 02/21/2021 4:29:54 PM By: Linton Ham MD Entered By: Linton Ham on 02/21/2021 12:37:16 -------------------------------------------------------------------------------- Progress Note Details Patient Name: Date of Service: [name] Foley F. 02/21/2021 10:30 A M Medical Record Number: [id number] Patient Account Number: [id number] Date of Birth/Sex: Treating RN: [birth year] (75 y.o. [name] [name], [name] [name] Primary Care Provider: [name], [name] Other Clinician: Referring Provider: Treating Provider/Extender: Mavrick Mcquigg [name],  [name] Weeks in Treatment: 253 Subjective History of Present Illness (HPI) The following HPI elements were documented for the patient's wound: Location: On the left and right lateral forefoot which has been there for about 6 months Quality: Patient reports No Pain. Severity: Patient states wound(s) are getting worse. Duration: Patient has had the wound for > 6 months prior to seeking treatment at the wound center Context: The wound would happen gradually Modifying Factors: Patient wound(s)/ulcer(s) are worsening due to :continual drainage from the wound Associated Signs and Symptoms: Patient reports having increase discharge. This patient returns after being seen here till the end of August and he was lost to follow-up. he has been quite debilitated laying in bed most of the time and his condition has deteriorated significantly. He has multiple ulcerations on the heel lateral forefoot and some of his toes. ======== Old notes: 75 year old male known to our practice when he was seen here in February and March and was lost to follow-up when he was admitted to hospital with various medical problems including coronary artery disease and a stroke. Now returns with the problem on the left forefoot where he has an ulceration and this has been there for about 6 months. 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 He has had persistent hyperglycemia given recently. Patient has a past medical history of diabetes mellitus, hypertension,  combined systolic and diastolic heart failure, peripheral  Coban Self-Adherent Wrap 4x5 (in/yd) (Generic) Every Other Day/30 Days Discharge Instructions: Apply over Kerlix as directed. WOUND #41: - Lower Leg Wound Laterality: Right, Anterior, Distal Cleanser: Wound Cleanser (Generic) Every Other Day/30 Days Discharge Instructions: Cleanse the wound with wound cleanser prior to applying a clean dressing using gauze sponges, not tissue or cotton balls. Cleanser: Soap and Water Every Other Day/30 Days Discharge Instructions: May shower and wash wound with dial antibacterial soap and water prior to dressing change. Peri-Wound Care: Triamcinolone 15 (g) Every Other Day/30 Days Discharge Instructions: Use triamcinolone 15 (g) as directed Peri-Wound Care: Sween Lotion (Moisturizing lotion) Every Other Day/30 Days Discharge Instructions: Apply moisturizing lotion as directed Prim Dressing: PolyMem Silver Non-Adhesive Dressing, 4.25x4.25 in Every Other Day/30 Days ary Discharge Instructions: Apply to wound bed as instructed Secondary Dressing: Woven Gauze Sponge, Non-Sterile 4x4 in (Generic) Every Other Day/30 Days Discharge Instructions: Apply over primary dressing as directed. Com pression Wrap: Kerlix Roll 4.5x3.1 (in/yd) (Generic) Every Other Day/30 Days Discharge Instructions: Apply Kerlix and Coban compression as directed. Com pression Wrap: Coban Self-Adherent Wrap 4x5 (in/yd) (Generic) Every Other Day/30 Days Discharge Instructions: Apply over Kerlix as directed. WOUND #42: - Lower Leg Wound Laterality: Right, Anterior,  Proximal Cleanser: Wound Cleanser (Generic) Every Other Day/30 Days Discharge Instructions: Cleanse the wound with wound cleanser prior to applying a clean dressing using gauze sponges, not tissue or cotton balls. Cleanser: Soap and Water Every Other Day/30 Days Discharge Instructions: May shower and wash wound with dial antibacterial soap and water prior to dressing change. Peri-Wound Care: Triamcinolone 15 (g) Every Other Day/30 Days Discharge Instructions: Use triamcinolone 15 (g) as directed Peri-Wound Care: Sween Lotion (Moisturizing lotion) Every Other Day/30 Days Discharge Instructions: Apply moisturizing lotion as directed Prim Dressing: PolyMem Silver Non-Adhesive Dressing, 4.25x4.25 in Every Other Day/30 Days ary Discharge Instructions: Apply to wound bed as instructed Secondary Dressing: Woven Gauze Sponge, Non-Sterile 4x4 in (Generic) Every Other Day/30 Days Discharge Instructions: Apply over primary dressing as directed. Com pression Wrap: Kerlix Roll 4.5x3.1 (in/yd) (Generic) Every Other Day/30 Days Discharge Instructions: Apply Kerlix and Coban compression as directed. Com pression Wrap: Coban Self-Adherent Wrap 4x5 (in/yd) (Generic) Every Other Day/30 Days Discharge Instructions: Apply over Kerlix as directed. 1. We are using polymen Ag to the heel right lateral calf 2. Silver alginate to the dorsal foot which is a lot worse today. 3. He is going to require a CT scan of the right foot rule out osteomyelitis 4. Vascular surgery seems to be saying that the patient's popliteal artery and posterior tibial artery angioplasties are patent and that he should have enough blood flow to heal these wounds. Notable for the fact that his TBI was quite normal Electronic Signature(s) Signed: 02/21/2021 4:29:54 PM By: Linton Ham MD Entered By: Linton Ham on 02/21/2021 12:41:45 -------------------------------------------------------------------------------- SuperBill  Details Patient Name: Date of Service: [name] Foley F. 02/21/2021 Medical Record Number: [id number] Patient Account Number: [id number] Date of Birth/Sex: Treating RN: 03-23-1946 (75 y.o. [name] [name], [name] [name] Primary Care Provider: [name], [name] Other Clinician: Referring Provider: Treating Provider/Extender: Diago Haik [name], [name] Weeks in Treatment: 253 Diagnosis Coding ICD-10 Codes Code Description E11.621 Type 2 diabetes mellitus with foot ulcer L97.518 Non-pressure chronic ulcer of other part of right foot with other specified severity L97.218 Non-pressure chronic ulcer of right calf with other specified severity S80.211D Abrasion, right knee, subsequent encounter L97.418 Non-pressure chronic ulcer of right heel and midfoot with other specified severity S80.211D Abrasion, right knee, subsequent encounter Facility Procedures  Coban Self-Adherent Wrap 4x5 (in/yd) (Generic) Every Other Day/30 Days Discharge Instructions: Apply over Kerlix as directed. WOUND #41: - Lower Leg Wound Laterality: Right, Anterior, Distal Cleanser: Wound Cleanser (Generic) Every Other Day/30 Days Discharge Instructions: Cleanse the wound with wound cleanser prior to applying a clean dressing using gauze sponges, not tissue or cotton balls. Cleanser: Soap and Water Every Other Day/30 Days Discharge Instructions: May shower and wash wound with dial antibacterial soap and water prior to dressing change. Peri-Wound Care: Triamcinolone 15 (g) Every Other Day/30 Days Discharge Instructions: Use triamcinolone 15 (g) as directed Peri-Wound Care: Sween Lotion (Moisturizing lotion) Every Other Day/30 Days Discharge Instructions: Apply moisturizing lotion as directed Prim Dressing: PolyMem Silver Non-Adhesive Dressing, 4.25x4.25 in Every Other Day/30 Days ary Discharge Instructions: Apply to wound bed as instructed Secondary Dressing: Woven Gauze Sponge, Non-Sterile 4x4 in (Generic) Every Other Day/30 Days Discharge Instructions: Apply over primary dressing as directed. Com pression Wrap: Kerlix Roll 4.5x3.1 (in/yd) (Generic) Every Other Day/30 Days Discharge Instructions: Apply Kerlix and Coban compression as directed. Com pression Wrap: Coban Self-Adherent Wrap 4x5 (in/yd) (Generic) Every Other Day/30 Days Discharge Instructions: Apply over Kerlix as directed. WOUND #42: - Lower Leg Wound Laterality: Right, Anterior,  Proximal Cleanser: Wound Cleanser (Generic) Every Other Day/30 Days Discharge Instructions: Cleanse the wound with wound cleanser prior to applying a clean dressing using gauze sponges, not tissue or cotton balls. Cleanser: Soap and Water Every Other Day/30 Days Discharge Instructions: May shower and wash wound with dial antibacterial soap and water prior to dressing change. Peri-Wound Care: Triamcinolone 15 (g) Every Other Day/30 Days Discharge Instructions: Use triamcinolone 15 (g) as directed Peri-Wound Care: Sween Lotion (Moisturizing lotion) Every Other Day/30 Days Discharge Instructions: Apply moisturizing lotion as directed Prim Dressing: PolyMem Silver Non-Adhesive Dressing, 4.25x4.25 in Every Other Day/30 Days ary Discharge Instructions: Apply to wound bed as instructed Secondary Dressing: Woven Gauze Sponge, Non-Sterile 4x4 in (Generic) Every Other Day/30 Days Discharge Instructions: Apply over primary dressing as directed. Com pression Wrap: Kerlix Roll 4.5x3.1 (in/yd) (Generic) Every Other Day/30 Days Discharge Instructions: Apply Kerlix and Coban compression as directed. Com pression Wrap: Coban Self-Adherent Wrap 4x5 (in/yd) (Generic) Every Other Day/30 Days Discharge Instructions: Apply over Kerlix as directed. 1. We are using polymen Ag to the heel right lateral calf 2. Silver alginate to the dorsal foot which is a lot worse today. 3. He is going to require a CT scan of the right foot rule out osteomyelitis 4. Vascular surgery seems to be saying that the patient's popliteal artery and posterior tibial artery angioplasties are patent and that he should have enough blood flow to heal these wounds. Notable for the fact that his TBI was quite normal Electronic Signature(s) Signed: 02/21/2021 4:29:54 PM By: Linton Ham MD Entered By: Linton Ham on 02/21/2021 12:41:45 -------------------------------------------------------------------------------- SuperBill  Details Patient Name: Date of Service: [name] Foley F. 02/21/2021 Medical Record Number: [id number] Patient Account Number: [id number] Date of Birth/Sex: Treating RN: 03-23-1946 (75 y.o. [name] [name], [name] [name] Primary Care Provider: [name], [name] Other Clinician: Referring Provider: Treating Provider/Extender: Diago Haik [name], [name] Weeks in Treatment: 253 Diagnosis Coding ICD-10 Codes Code Description E11.621 Type 2 diabetes mellitus with foot ulcer L97.518 Non-pressure chronic ulcer of other part of right foot with other specified severity L97.218 Non-pressure chronic ulcer of right calf with other specified severity S80.211D Abrasion, right knee, subsequent encounter L97.418 Non-pressure chronic ulcer of right heel and midfoot with other specified severity S80.211D Abrasion, right knee, subsequent encounter Facility Procedures  Coban Self-Adherent Wrap 4x5 (in/yd) (Generic) Every Other Day/30 Days Discharge Instructions: Apply over Kerlix as directed. WOUND #41: - Lower Leg Wound Laterality: Right, Anterior, Distal Cleanser: Wound Cleanser (Generic) Every Other Day/30 Days Discharge Instructions: Cleanse the wound with wound cleanser prior to applying a clean dressing using gauze sponges, not tissue or cotton balls. Cleanser: Soap and Water Every Other Day/30 Days Discharge Instructions: May shower and wash wound with dial antibacterial soap and water prior to dressing change. Peri-Wound Care: Triamcinolone 15 (g) Every Other Day/30 Days Discharge Instructions: Use triamcinolone 15 (g) as directed Peri-Wound Care: Sween Lotion (Moisturizing lotion) Every Other Day/30 Days Discharge Instructions: Apply moisturizing lotion as directed Prim Dressing: PolyMem Silver Non-Adhesive Dressing, 4.25x4.25 in Every Other Day/30 Days ary Discharge Instructions: Apply to wound bed as instructed Secondary Dressing: Woven Gauze Sponge, Non-Sterile 4x4 in (Generic) Every Other Day/30 Days Discharge Instructions: Apply over primary dressing as directed. Com pression Wrap: Kerlix Roll 4.5x3.1 (in/yd) (Generic) Every Other Day/30 Days Discharge Instructions: Apply Kerlix and Coban compression as directed. Com pression Wrap: Coban Self-Adherent Wrap 4x5 (in/yd) (Generic) Every Other Day/30 Days Discharge Instructions: Apply over Kerlix as directed. WOUND #42: - Lower Leg Wound Laterality: Right, Anterior,  Proximal Cleanser: Wound Cleanser (Generic) Every Other Day/30 Days Discharge Instructions: Cleanse the wound with wound cleanser prior to applying a clean dressing using gauze sponges, not tissue or cotton balls. Cleanser: Soap and Water Every Other Day/30 Days Discharge Instructions: May shower and wash wound with dial antibacterial soap and water prior to dressing change. Peri-Wound Care: Triamcinolone 15 (g) Every Other Day/30 Days Discharge Instructions: Use triamcinolone 15 (g) as directed Peri-Wound Care: Sween Lotion (Moisturizing lotion) Every Other Day/30 Days Discharge Instructions: Apply moisturizing lotion as directed Prim Dressing: PolyMem Silver Non-Adhesive Dressing, 4.25x4.25 in Every Other Day/30 Days ary Discharge Instructions: Apply to wound bed as instructed Secondary Dressing: Woven Gauze Sponge, Non-Sterile 4x4 in (Generic) Every Other Day/30 Days Discharge Instructions: Apply over primary dressing as directed. Com pression Wrap: Kerlix Roll 4.5x3.1 (in/yd) (Generic) Every Other Day/30 Days Discharge Instructions: Apply Kerlix and Coban compression as directed. Com pression Wrap: Coban Self-Adherent Wrap 4x5 (in/yd) (Generic) Every Other Day/30 Days Discharge Instructions: Apply over Kerlix as directed. 1. We are using polymen Ag to the heel right lateral calf 2. Silver alginate to the dorsal foot which is a lot worse today. 3. He is going to require a CT scan of the right foot rule out osteomyelitis 4. Vascular surgery seems to be saying that the patient's popliteal artery and posterior tibial artery angioplasties are patent and that he should have enough blood flow to heal these wounds. Notable for the fact that his TBI was quite normal Electronic Signature(s) Signed: 02/21/2021 4:29:54 PM By: Linton Ham MD Entered By: Linton Ham on 02/21/2021 12:41:45 -------------------------------------------------------------------------------- SuperBill  Details Patient Name: Date of Service: [name] Foley F. 02/21/2021 Medical Record Number: [id number] Patient Account Number: [id number] Date of Birth/Sex: Treating RN: 03-23-1946 (75 y.o. [name] [name], [name] [name] Primary Care Provider: [name], [name] Other Clinician: Referring Provider: Treating Provider/Extender: Diago Haik [name], [name] Weeks in Treatment: 253 Diagnosis Coding ICD-10 Codes Code Description E11.621 Type 2 diabetes mellitus with foot ulcer L97.518 Non-pressure chronic ulcer of other part of right foot with other specified severity L97.218 Non-pressure chronic ulcer of right calf with other specified severity S80.211D Abrasion, right knee, subsequent encounter L97.418 Non-pressure chronic ulcer of right heel and midfoot with other specified severity S80.211D Abrasion, right knee, subsequent encounter Facility Procedures  Inactive Date L89.613 Pressure ulcer of right heel, stage 3 04/17/2016 04/17/2016 L97.221 Non-pressure chronic ulcer of left calf limited to breakdown of skin 08/15/2017 08/15/2017 L97.321 Non-pressure chronic ulcer of left ankle limited to breakdown  of skin 07/01/2018 07/01/2018 L03.032 Cellulitis of left toe 01/06/2019 01/06/2019 Z32.992 Pressure ulcer of left heel, stage 3 12/04/2016 12/04/2016 I25.119 Atherosclerotic heart disease of native coronary artery with unspecified angina 04/17/2016 04/17/2016 pectoris L97.821 Non-pressure chronic ulcer of other part of left lower leg limited to breakdown of skin 01/06/2019 01/06/2019 E26.83 Acute diastolic (congestive) heart failure 04/17/2016 04/17/2016 L97.521 Non-pressure chronic ulcer of other part of left foot limited to breakdown of skin 02/17/2019 02/17/2019 S51.811D Laceration without foreign body of right forearm, subsequent encounter 10/22/2017 10/22/2017 L03.116 Cellulitis of left lower limb 12/24/2017 12/24/2017 L89.620 Pressure ulcer of left heel, unstageable 07/21/2019 07/21/2019 L97.524 Non-pressure chronic ulcer of other part of left foot with necrosis of bone 06/14/2020 06/14/2020 L97.211 Non-pressure chronic ulcer of right calf limited to breakdown of skin 07/21/2019 07/21/2019 M86.172 Other acute osteomyelitis, left ankle and foot 08/30/2020 08/30/2020 S80.211D Abrasion, right knee, subsequent encounter 08/18/2019 08/18/2019 Resolved Problems ICD-10 Code Description Active Date Resolved Date L89.512 Pressure ulcer of right ankle, stage 2 04/17/2016 04/17/2016 L89.522 Pressure ulcer of left ankle, stage 2 04/17/2016 04/17/2016 Electronic Signature(s) Signed: 02/21/2021 4:29:54 PM By: Linton Ham MD Entered By: Linton Ham on 02/21/2021 12:37:16 -------------------------------------------------------------------------------- Progress Note Details Patient Name: Date of Service: [name] Foley F. 02/21/2021 10:30 A M Medical Record Number: [id number] Patient Account Number: [id number] Date of Birth/Sex: Treating RN: [birth year] (75 y.o. [name] [name], [name] [name] Primary Care Provider: [name], [name] Other Clinician: Referring Provider: Treating Provider/Extender: Mavrick Mcquigg [name],  [name] Weeks in Treatment: 253 Subjective History of Present Illness (HPI) The following HPI elements were documented for the patient's wound: Location: On the left and right lateral forefoot which has been there for about 6 months Quality: Patient reports No Pain. Severity: Patient states wound(s) are getting worse. Duration: Patient has had the wound for > 6 months prior to seeking treatment at the wound center Context: The wound would happen gradually Modifying Factors: Patient wound(s)/ulcer(s) are worsening due to :continual drainage from the wound Associated Signs and Symptoms: Patient reports having increase discharge. This patient returns after being seen here till the end of August and he was lost to follow-up. he has been quite debilitated laying in bed most of the time and his condition has deteriorated significantly. He has multiple ulcerations on the heel lateral forefoot and some of his toes. ======== Old notes: 75 year old male known to our practice when he was seen here in February and March and was lost to follow-up when he was admitted to hospital with various medical problems including coronary artery disease and a stroke. Now returns with the problem on the left forefoot where he has an ulceration and this has been there for about 6 months. 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 He has had persistent hyperglycemia given recently. Patient has a past medical history of diabetes mellitus, hypertension,  combined systolic and diastolic heart failure, peripheral  Coban Self-Adherent Wrap 4x5 (in/yd) (Generic) Every Other Day/30 Days Discharge Instructions: Apply over Kerlix as directed. WOUND #41: - Lower Leg Wound Laterality: Right, Anterior, Distal Cleanser: Wound Cleanser (Generic) Every Other Day/30 Days Discharge Instructions: Cleanse the wound with wound cleanser prior to applying a clean dressing using gauze sponges, not tissue or cotton balls. Cleanser: Soap and Water Every Other Day/30 Days Discharge Instructions: May shower and wash wound with dial antibacterial soap and water prior to dressing change. Peri-Wound Care: Triamcinolone 15 (g) Every Other Day/30 Days Discharge Instructions: Use triamcinolone 15 (g) as directed Peri-Wound Care: Sween Lotion (Moisturizing lotion) Every Other Day/30 Days Discharge Instructions: Apply moisturizing lotion as directed Prim Dressing: PolyMem Silver Non-Adhesive Dressing, 4.25x4.25 in Every Other Day/30 Days ary Discharge Instructions: Apply to wound bed as instructed Secondary Dressing: Woven Gauze Sponge, Non-Sterile 4x4 in (Generic) Every Other Day/30 Days Discharge Instructions: Apply over primary dressing as directed. Com pression Wrap: Kerlix Roll 4.5x3.1 (in/yd) (Generic) Every Other Day/30 Days Discharge Instructions: Apply Kerlix and Coban compression as directed. Com pression Wrap: Coban Self-Adherent Wrap 4x5 (in/yd) (Generic) Every Other Day/30 Days Discharge Instructions: Apply over Kerlix as directed. WOUND #42: - Lower Leg Wound Laterality: Right, Anterior,  Proximal Cleanser: Wound Cleanser (Generic) Every Other Day/30 Days Discharge Instructions: Cleanse the wound with wound cleanser prior to applying a clean dressing using gauze sponges, not tissue or cotton balls. Cleanser: Soap and Water Every Other Day/30 Days Discharge Instructions: May shower and wash wound with dial antibacterial soap and water prior to dressing change. Peri-Wound Care: Triamcinolone 15 (g) Every Other Day/30 Days Discharge Instructions: Use triamcinolone 15 (g) as directed Peri-Wound Care: Sween Lotion (Moisturizing lotion) Every Other Day/30 Days Discharge Instructions: Apply moisturizing lotion as directed Prim Dressing: PolyMem Silver Non-Adhesive Dressing, 4.25x4.25 in Every Other Day/30 Days ary Discharge Instructions: Apply to wound bed as instructed Secondary Dressing: Woven Gauze Sponge, Non-Sterile 4x4 in (Generic) Every Other Day/30 Days Discharge Instructions: Apply over primary dressing as directed. Com pression Wrap: Kerlix Roll 4.5x3.1 (in/yd) (Generic) Every Other Day/30 Days Discharge Instructions: Apply Kerlix and Coban compression as directed. Com pression Wrap: Coban Self-Adherent Wrap 4x5 (in/yd) (Generic) Every Other Day/30 Days Discharge Instructions: Apply over Kerlix as directed. 1. We are using polymen Ag to the heel right lateral calf 2. Silver alginate to the dorsal foot which is a lot worse today. 3. He is going to require a CT scan of the right foot rule out osteomyelitis 4. Vascular surgery seems to be saying that the patient's popliteal artery and posterior tibial artery angioplasties are patent and that he should have enough blood flow to heal these wounds. Notable for the fact that his TBI was quite normal Electronic Signature(s) Signed: 02/21/2021 4:29:54 PM By: Linton Ham MD Entered By: Linton Ham on 02/21/2021 12:41:45 -------------------------------------------------------------------------------- SuperBill  Details Patient Name: Date of Service: [name] Foley F. 02/21/2021 Medical Record Number: [id number] Patient Account Number: [id number] Date of Birth/Sex: Treating RN: 03-23-1946 (75 y.o. [name] [name], [name] [name] Primary Care Provider: [name], [name] Other Clinician: Referring Provider: Treating Provider/Extender: Diago Haik [name], [name] Weeks in Treatment: 253 Diagnosis Coding ICD-10 Codes Code Description E11.621 Type 2 diabetes mellitus with foot ulcer L97.518 Non-pressure chronic ulcer of other part of right foot with other specified severity L97.218 Non-pressure chronic ulcer of right calf with other specified severity S80.211D Abrasion, right knee, subsequent encounter L97.418 Non-pressure chronic ulcer of right heel and midfoot with other specified severity S80.211D Abrasion, right knee, subsequent encounter Facility Procedures  Inactive Date L89.613 Pressure ulcer of right heel, stage 3 04/17/2016 04/17/2016 L97.221 Non-pressure chronic ulcer of left calf limited to breakdown of skin 08/15/2017 08/15/2017 L97.321 Non-pressure chronic ulcer of left ankle limited to breakdown  of skin 07/01/2018 07/01/2018 L03.032 Cellulitis of left toe 01/06/2019 01/06/2019 Z32.992 Pressure ulcer of left heel, stage 3 12/04/2016 12/04/2016 I25.119 Atherosclerotic heart disease of native coronary artery with unspecified angina 04/17/2016 04/17/2016 pectoris L97.821 Non-pressure chronic ulcer of other part of left lower leg limited to breakdown of skin 01/06/2019 01/06/2019 E26.83 Acute diastolic (congestive) heart failure 04/17/2016 04/17/2016 L97.521 Non-pressure chronic ulcer of other part of left foot limited to breakdown of skin 02/17/2019 02/17/2019 S51.811D Laceration without foreign body of right forearm, subsequent encounter 10/22/2017 10/22/2017 L03.116 Cellulitis of left lower limb 12/24/2017 12/24/2017 L89.620 Pressure ulcer of left heel, unstageable 07/21/2019 07/21/2019 L97.524 Non-pressure chronic ulcer of other part of left foot with necrosis of bone 06/14/2020 06/14/2020 L97.211 Non-pressure chronic ulcer of right calf limited to breakdown of skin 07/21/2019 07/21/2019 M86.172 Other acute osteomyelitis, left ankle and foot 08/30/2020 08/30/2020 S80.211D Abrasion, right knee, subsequent encounter 08/18/2019 08/18/2019 Resolved Problems ICD-10 Code Description Active Date Resolved Date L89.512 Pressure ulcer of right ankle, stage 2 04/17/2016 04/17/2016 L89.522 Pressure ulcer of left ankle, stage 2 04/17/2016 04/17/2016 Electronic Signature(s) Signed: 02/21/2021 4:29:54 PM By: Linton Ham MD Entered By: Linton Ham on 02/21/2021 12:37:16 -------------------------------------------------------------------------------- Progress Note Details Patient Name: Date of Service: [name] Foley F. 02/21/2021 10:30 A M Medical Record Number: [id number] Patient Account Number: [id number] Date of Birth/Sex: Treating RN: [birth year] (75 y.o. [name] [name], [name] [name] Primary Care Provider: [name], [name] Other Clinician: Referring Provider: Treating Provider/Extender: Mavrick Mcquigg [name],  [name] Weeks in Treatment: 253 Subjective History of Present Illness (HPI) The following HPI elements were documented for the patient's wound: Location: On the left and right lateral forefoot which has been there for about 6 months Quality: Patient reports No Pain. Severity: Patient states wound(s) are getting worse. Duration: Patient has had the wound for > 6 months prior to seeking treatment at the wound center Context: The wound would happen gradually Modifying Factors: Patient wound(s)/ulcer(s) are worsening due to :continual drainage from the wound Associated Signs and Symptoms: Patient reports having increase discharge. This patient returns after being seen here till the end of August and he was lost to follow-up. he has been quite debilitated laying in bed most of the time and his condition has deteriorated significantly. He has multiple ulcerations on the heel lateral forefoot and some of his toes. ======== Old notes: 75 year old male known to our practice when he was seen here in February and March and was lost to follow-up when he was admitted to hospital with various medical problems including coronary artery disease and a stroke. Now returns with the problem on the left forefoot where he has an ulceration and this has been there for about 6 months. 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 He has had persistent hyperglycemia given recently. Patient has a past medical history of diabetes mellitus, hypertension,  combined systolic and diastolic heart failure, peripheral  Inactive Date L89.613 Pressure ulcer of right heel, stage 3 04/17/2016 04/17/2016 L97.221 Non-pressure chronic ulcer of left calf limited to breakdown of skin 08/15/2017 08/15/2017 L97.321 Non-pressure chronic ulcer of left ankle limited to breakdown  of skin 07/01/2018 07/01/2018 L03.032 Cellulitis of left toe 01/06/2019 01/06/2019 Z32.992 Pressure ulcer of left heel, stage 3 12/04/2016 12/04/2016 I25.119 Atherosclerotic heart disease of native coronary artery with unspecified angina 04/17/2016 04/17/2016 pectoris L97.821 Non-pressure chronic ulcer of other part of left lower leg limited to breakdown of skin 01/06/2019 01/06/2019 E26.83 Acute diastolic (congestive) heart failure 04/17/2016 04/17/2016 L97.521 Non-pressure chronic ulcer of other part of left foot limited to breakdown of skin 02/17/2019 02/17/2019 S51.811D Laceration without foreign body of right forearm, subsequent encounter 10/22/2017 10/22/2017 L03.116 Cellulitis of left lower limb 12/24/2017 12/24/2017 L89.620 Pressure ulcer of left heel, unstageable 07/21/2019 07/21/2019 L97.524 Non-pressure chronic ulcer of other part of left foot with necrosis of bone 06/14/2020 06/14/2020 L97.211 Non-pressure chronic ulcer of right calf limited to breakdown of skin 07/21/2019 07/21/2019 M86.172 Other acute osteomyelitis, left ankle and foot 08/30/2020 08/30/2020 S80.211D Abrasion, right knee, subsequent encounter 08/18/2019 08/18/2019 Resolved Problems ICD-10 Code Description Active Date Resolved Date L89.512 Pressure ulcer of right ankle, stage 2 04/17/2016 04/17/2016 L89.522 Pressure ulcer of left ankle, stage 2 04/17/2016 04/17/2016 Electronic Signature(s) Signed: 02/21/2021 4:29:54 PM By: Linton Ham MD Entered By: Linton Ham on 02/21/2021 12:37:16 -------------------------------------------------------------------------------- Progress Note Details Patient Name: Date of Service: [name] Foley F. 02/21/2021 10:30 A M Medical Record Number: [id number] Patient Account Number: [id number] Date of Birth/Sex: Treating RN: [birth year] (75 y.o. [name] [name], [name] [name] Primary Care Provider: [name], [name] Other Clinician: Referring Provider: Treating Provider/Extender: Mavrick Mcquigg [name],  [name] Weeks in Treatment: 253 Subjective History of Present Illness (HPI) The following HPI elements were documented for the patient's wound: Location: On the left and right lateral forefoot which has been there for about 6 months Quality: Patient reports No Pain. Severity: Patient states wound(s) are getting worse. Duration: Patient has had the wound for > 6 months prior to seeking treatment at the wound center Context: The wound would happen gradually Modifying Factors: Patient wound(s)/ulcer(s) are worsening due to :continual drainage from the wound Associated Signs and Symptoms: Patient reports having increase discharge. This patient returns after being seen here till the end of August and he was lost to follow-up. he has been quite debilitated laying in bed most of the time and his condition has deteriorated significantly. He has multiple ulcerations on the heel lateral forefoot and some of his toes. ======== Old notes: 75 year old male known to our practice when he was seen here in February and March and was lost to follow-up when he was admitted to hospital with various medical problems including coronary artery disease and a stroke. Now returns with the problem on the left forefoot where he has an ulceration and this has been there for about 6 months. 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 He has had persistent hyperglycemia given recently. Patient has a past medical history of diabetes mellitus, hypertension,  combined systolic and diastolic heart failure, peripheral  g) Every Other Day/30 Days Discharge Instructions: Use triamcinolone 15 (g) as directed Peri-Wound Care: Sween Lotion (Moisturizing lotion) Every Other Day/30 Days Discharge Instructions: Apply moisturizing lotion as directed Prim Dressing: PolyMem Silver Non-Adhesive Dressing, 4.25x4.25 in Every Other Day/30 Days ary Discharge Instructions: Apply to wound bed as instructed Secondary Dressing: Woven Gauze Sponge, Non-Sterile 4x4 in (Generic) Every Other Day/30 Days Discharge Instructions: Apply over primary dressing as directed. Compression Wrap: Kerlix Roll 4.5x3.1 (in/yd) (Generic) Every Other Day/30 Days Discharge Instructions: Apply Kerlix and Coban compression as directed. Compression Wrap: Coban Self-Adherent Wrap 4x5 (in/yd) (Generic) Every Other Day/30 Days Discharge Instructions: Apply over Kerlix as directed. Wound #42 - Lower Leg Wound Laterality: Right,  Anterior, Proximal Cleanser: Wound Cleanser (Generic) Every Other Day/30 Days Discharge Instructions: Cleanse the wound with wound cleanser prior to applying a clean dressing using gauze sponges, not tissue or cotton balls. Cleanser: Soap and Water Every Other Day/30 Days Discharge Instructions: May shower and wash wound with dial antibacterial soap and water prior to dressing change. Peri-Wound Care: Triamcinolone 15 (g) Every Other Day/30 Days Discharge Instructions: Use triamcinolone 15 (g) as directed Peri-Wound Care: Sween Lotion (Moisturizing lotion) Every Other Day/30 Days Discharge Instructions: Apply moisturizing lotion as directed Prim Dressing: PolyMem Silver Non-Adhesive Dressing, 4.25x4.25 in Every Other Day/30 Days ary Discharge Instructions: Apply to wound bed as instructed Secondary Dressing: Woven Gauze Sponge, Non-Sterile 4x4 in (Generic) Every Other Day/30 Days Discharge Instructions: Apply over primary dressing as directed. Compression Wrap: Kerlix Roll 4.5x3.1 (in/yd) (Generic) Every Other Day/30 Days Discharge Instructions: Apply Kerlix and Coban compression as directed. Compression Wrap: Coban Self-Adherent Wrap 4x5 (in/yd) (Generic) Every Other Day/30 Days Discharge Instructions: Apply over Kerlix as directed. Laboratory naerobe culture (MICRO) - right medial foot Bacteria identified in Unspecified specimen by A LOINC Code: 417-4 Convenience Name: Anerobic culture Radiology Computed Tomography (CT) Scan - WITHOUT CONTRAST RIGHT MEDIAL FOOT ; Electronic Signature(s) Signed: 02/21/2021 4:29:54 PM By: Linton Ham MD Signed: 02/21/2021 4:48:23 PM By: [name] Entered By: [name] on 02/21/2021 11:42:18 Prescription 02/21/2021 -------------------------------------------------------------------------------- [name] MD Patient Name: Provider: [birth year] [id number] Date of Birth: NPI#[name] [id number] Sex: DEA  #: [phone] Phone #: License #: [place] Patient Address: [address], [place] [address] [place], [place] [address] [phone] Allergies Sulfa Provider's Orders Computed Tomography (CT) Scan - WITHOUT CONTRAST RIGHT MEDIAL FOOT ; Hand Signature: Date(s): Electronic Signature(s) Signed: 02/21/2021 4:29:54 PM By: Linton Ham MD Signed: 02/21/2021 4:48:23 PM By: [name] Entered By: [name] on 02/21/2021 11:42:27 -------------------------------------------------------------------------------- Problem List Details Patient Name: Date of Service: [name] Foley F. 02/21/2021 10:30 A M Medical Record Number: [id number] Patient Account Number: [id number] Date of Birth/Sex: Treating RN: 1945-07-08 (75 y.o. 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 11/01/2020 No Yes S80.211D Abrasion, right knee, subsequent encounter 11/01/2020 No Yes 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  g) Every Other Day/30 Days Discharge Instructions: Use triamcinolone 15 (g) as directed Peri-Wound Care: Sween Lotion (Moisturizing lotion) Every Other Day/30 Days Discharge Instructions: Apply moisturizing lotion as directed Prim Dressing: PolyMem Silver Non-Adhesive Dressing, 4.25x4.25 in Every Other Day/30 Days ary Discharge Instructions: Apply to wound bed as instructed Secondary Dressing: Woven Gauze Sponge, Non-Sterile 4x4 in (Generic) Every Other Day/30 Days Discharge Instructions: Apply over primary dressing as directed. Compression Wrap: Kerlix Roll 4.5x3.1 (in/yd) (Generic) Every Other Day/30 Days Discharge Instructions: Apply Kerlix and Coban compression as directed. Compression Wrap: Coban Self-Adherent Wrap 4x5 (in/yd) (Generic) Every Other Day/30 Days Discharge Instructions: Apply over Kerlix as directed. Wound #42 - Lower Leg Wound Laterality: Right,  Anterior, Proximal Cleanser: Wound Cleanser (Generic) Every Other Day/30 Days Discharge Instructions: Cleanse the wound with wound cleanser prior to applying a clean dressing using gauze sponges, not tissue or cotton balls. Cleanser: Soap and Water Every Other Day/30 Days Discharge Instructions: May shower and wash wound with dial antibacterial soap and water prior to dressing change. Peri-Wound Care: Triamcinolone 15 (g) Every Other Day/30 Days Discharge Instructions: Use triamcinolone 15 (g) as directed Peri-Wound Care: Sween Lotion (Moisturizing lotion) Every Other Day/30 Days Discharge Instructions: Apply moisturizing lotion as directed Prim Dressing: PolyMem Silver Non-Adhesive Dressing, 4.25x4.25 in Every Other Day/30 Days ary Discharge Instructions: Apply to wound bed as instructed Secondary Dressing: Woven Gauze Sponge, Non-Sterile 4x4 in (Generic) Every Other Day/30 Days Discharge Instructions: Apply over primary dressing as directed. Compression Wrap: Kerlix Roll 4.5x3.1 (in/yd) (Generic) Every Other Day/30 Days Discharge Instructions: Apply Kerlix and Coban compression as directed. Compression Wrap: Coban Self-Adherent Wrap 4x5 (in/yd) (Generic) Every Other Day/30 Days Discharge Instructions: Apply over Kerlix as directed. Laboratory naerobe culture (MICRO) - right medial foot Bacteria identified in Unspecified specimen by A LOINC Code: 417-4 Convenience Name: Anerobic culture Radiology Computed Tomography (CT) Scan - WITHOUT CONTRAST RIGHT MEDIAL FOOT ; Electronic Signature(s) Signed: 02/21/2021 4:29:54 PM By: Linton Ham MD Signed: 02/21/2021 4:48:23 PM By: [name] Entered By: [name] on 02/21/2021 11:42:18 Prescription 02/21/2021 -------------------------------------------------------------------------------- [name] MD Patient Name: Provider: [birth year] [id number] Date of Birth: NPI#[name] [id number] Sex: DEA  #: [phone] Phone #: License #: [place] Patient Address: [address], [place] [address] [place], [place] [address] [phone] Allergies Sulfa Provider's Orders Computed Tomography (CT) Scan - WITHOUT CONTRAST RIGHT MEDIAL FOOT ; Hand Signature: Date(s): Electronic Signature(s) Signed: 02/21/2021 4:29:54 PM By: Linton Ham MD Signed: 02/21/2021 4:48:23 PM By: [name] Entered By: [name] on 02/21/2021 11:42:27 -------------------------------------------------------------------------------- Problem List Details Patient Name: Date of Service: [name] Foley F. 02/21/2021 10:30 A M Medical Record Number: [id number] Patient Account Number: [id number] Date of Birth/Sex: Treating RN: 1945-07-08 (75 y.o. 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 11/01/2020 No Yes S80.211D Abrasion, right knee, subsequent encounter 11/01/2020 No Yes 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 WOUND #41: - Lower Leg Wound Laterality: Right, Anterior, Distal Cleanser: Wound Cleanser (Generic) Every Other Day/30 Days Discharge Instructions: Cleanse the wound with wound cleanser prior to applying a clean dressing using gauze sponges, not tissue or cotton balls. Cleanser: Soap and Water Every Other Day/30 Days Discharge Instructions: May shower and wash wound with dial antibacterial soap and water prior to dressing change. Peri-Wound Care: Triamcinolone 15 (g) Every Other Day/30 Days Discharge Instructions: Use triamcinolone 15 (g) as directed Peri-Wound Care: Sween Lotion (Moisturizing lotion) Every Other Day/30 Days Discharge Instructions: Apply moisturizing lotion as directed Prim Dressing: PolyMem Silver Non-Adhesive Dressing, 4.25x4.25 in Every Other Day/30 Days ary Discharge Instructions: Apply to wound bed as instructed Secondary Dressing: Woven Gauze Sponge, Non-Sterile 4x4 in (Generic) Every Other Day/30 Days Discharge Instructions: Apply over primary dressing as directed. Com pression Wrap: Kerlix Roll 4.5x3.1 (in/yd) (Generic) Every Other Day/30 Days Discharge Instructions: Apply Kerlix and Coban compression as directed. Com pression Wrap: Coban Self-Adherent Wrap 4x5 (in/yd) (Generic) Every Other Day/30 Days Discharge Instructions: Apply over Kerlix as directed. WOUND #42: - Lower Leg Wound Laterality: Right, Anterior,  Proximal Cleanser: Wound Cleanser (Generic) Every Other Day/30 Days Discharge Instructions: Cleanse the wound with wound cleanser prior to applying a clean dressing using gauze sponges, not tissue or cotton balls. Cleanser: Soap and Water Every Other Day/30 Days Discharge Instructions: May shower and wash wound with dial antibacterial soap and water prior to dressing change. Peri-Wound Care: Triamcinolone 15 (g) Every Other Day/30 Days Discharge Instructions: Use triamcinolone 15 (g) as directed Peri-Wound Care: Sween Lotion (Moisturizing lotion) Every Other Day/30 Days Discharge Instructions: Apply moisturizing lotion as directed Prim Dressing: PolyMem Silver Non-Adhesive Dressing, 4.25x4.25 in Every Other Day/30 Days ary Discharge Instructions: Apply to wound bed as instructed Secondary Dressing: Woven Gauze Sponge, Non-Sterile 4x4 in (Generic) Every Other Day/30 Days Discharge Instructions: Apply over primary dressing as directed. Com pression Wrap: Kerlix Roll 4.5x3.1 (in/yd) (Generic) Every Other Day/30 Days Discharge Instructions: Apply Kerlix and Coban compression as directed. Com pression Wrap: Coban Self-Adherent Wrap 4x5 (in/yd) (Generic) Every Other Day/30 Days Discharge Instructions: Apply over Kerlix as directed. 1. We are using polymen Ag to the heel right lateral calf 2. Silver alginate to the dorsal foot which is a lot worse today. 3. He is going to require a CT scan of the right foot rule out osteomyelitis 4. Vascular surgery seems to be saying that the patient's popliteal artery and posterior tibial artery angioplasties are patent and that he should have enough blood flow to heal these wounds. Notable for the fact that his TBI was quite normal Electronic Signature(s) Signed: 02/21/2021 4:29:54 PM By: Linton Ham MD Entered By: Linton Ham on 02/21/2021 12:41:45 -------------------------------------------------------------------------------- SuperBill  Details Patient Name: Date of Service: [name] Foley F. 02/21/2021 Medical Record Number: [id number] Patient Account Number: [id number] Date of Birth/Sex: Treating RN: 03-23-1946 (75 y.o. [name] [name], [name] [name] Primary Care Provider: [name], [name] Other Clinician: Referring Provider: Treating Provider/Extender: Diago Haik [name], [name] Weeks in Treatment: 253 Diagnosis Coding ICD-10 Codes Code Description E11.621 Type 2 diabetes mellitus with foot ulcer L97.518 Non-pressure chronic ulcer of other part of right foot with other specified severity L97.218 Non-pressure chronic ulcer of right calf with other specified severity S80.211D Abrasion, right knee, subsequent encounter L97.418 Non-pressure chronic ulcer of right heel and midfoot with other specified severity S80.211D Abrasion, right knee, subsequent encounter Facility Procedures  Coban Self-Adherent Wrap 4x5 (in/yd) (Generic) Every Other Day/30 Days Discharge Instructions: Apply over Kerlix as directed. WOUND #41: - Lower Leg Wound Laterality: Right, Anterior, Distal Cleanser: Wound Cleanser (Generic) Every Other Day/30 Days Discharge Instructions: Cleanse the wound with wound cleanser prior to applying a clean dressing using gauze sponges, not tissue or cotton balls. Cleanser: Soap and Water Every Other Day/30 Days Discharge Instructions: May shower and wash wound with dial antibacterial soap and water prior to dressing change. Peri-Wound Care: Triamcinolone 15 (g) Every Other Day/30 Days Discharge Instructions: Use triamcinolone 15 (g) as directed Peri-Wound Care: Sween Lotion (Moisturizing lotion) Every Other Day/30 Days Discharge Instructions: Apply moisturizing lotion as directed Prim Dressing: PolyMem Silver Non-Adhesive Dressing, 4.25x4.25 in Every Other Day/30 Days ary Discharge Instructions: Apply to wound bed as instructed Secondary Dressing: Woven Gauze Sponge, Non-Sterile 4x4 in (Generic) Every Other Day/30 Days Discharge Instructions: Apply over primary dressing as directed. Com pression Wrap: Kerlix Roll 4.5x3.1 (in/yd) (Generic) Every Other Day/30 Days Discharge Instructions: Apply Kerlix and Coban compression as directed. Com pression Wrap: Coban Self-Adherent Wrap 4x5 (in/yd) (Generic) Every Other Day/30 Days Discharge Instructions: Apply over Kerlix as directed. WOUND #42: - Lower Leg Wound Laterality: Right, Anterior,  Proximal Cleanser: Wound Cleanser (Generic) Every Other Day/30 Days Discharge Instructions: Cleanse the wound with wound cleanser prior to applying a clean dressing using gauze sponges, not tissue or cotton balls. Cleanser: Soap and Water Every Other Day/30 Days Discharge Instructions: May shower and wash wound with dial antibacterial soap and water prior to dressing change. Peri-Wound Care: Triamcinolone 15 (g) Every Other Day/30 Days Discharge Instructions: Use triamcinolone 15 (g) as directed Peri-Wound Care: Sween Lotion (Moisturizing lotion) Every Other Day/30 Days Discharge Instructions: Apply moisturizing lotion as directed Prim Dressing: PolyMem Silver Non-Adhesive Dressing, 4.25x4.25 in Every Other Day/30 Days ary Discharge Instructions: Apply to wound bed as instructed Secondary Dressing: Woven Gauze Sponge, Non-Sterile 4x4 in (Generic) Every Other Day/30 Days Discharge Instructions: Apply over primary dressing as directed. Com pression Wrap: Kerlix Roll 4.5x3.1 (in/yd) (Generic) Every Other Day/30 Days Discharge Instructions: Apply Kerlix and Coban compression as directed. Com pression Wrap: Coban Self-Adherent Wrap 4x5 (in/yd) (Generic) Every Other Day/30 Days Discharge Instructions: Apply over Kerlix as directed. 1. We are using polymen Ag to the heel right lateral calf 2. Silver alginate to the dorsal foot which is a lot worse today. 3. He is going to require a CT scan of the right foot rule out osteomyelitis 4. Vascular surgery seems to be saying that the patient's popliteal artery and posterior tibial artery angioplasties are patent and that he should have enough blood flow to heal these wounds. Notable for the fact that his TBI was quite normal Electronic Signature(s) Signed: 02/21/2021 4:29:54 PM By: Linton Ham MD Entered By: Linton Ham on 02/21/2021 12:41:45 -------------------------------------------------------------------------------- SuperBill  Details Patient Name: Date of Service: [name] Foley F. 02/21/2021 Medical Record Number: [id number] Patient Account Number: [id number] Date of Birth/Sex: Treating RN: 03-23-1946 (75 y.o. [name] [name], [name] [name] Primary Care Provider: [name], [name] Other Clinician: Referring Provider: Treating Provider/Extender: Diago Haik [name], [name] Weeks in Treatment: 253 Diagnosis Coding ICD-10 Codes Code Description E11.621 Type 2 diabetes mellitus with foot ulcer L97.518 Non-pressure chronic ulcer of other part of right foot with other specified severity L97.218 Non-pressure chronic ulcer of right calf with other specified severity S80.211D Abrasion, right knee, subsequent encounter L97.418 Non-pressure chronic ulcer of right heel and midfoot with other specified severity S80.211D Abrasion, right knee, subsequent encounter Facility Procedures  g) Every Other Day/30 Days Discharge Instructions: Use triamcinolone 15 (g) as directed Peri-Wound Care: Sween Lotion (Moisturizing lotion) Every Other Day/30 Days Discharge Instructions: Apply moisturizing lotion as directed Prim Dressing: PolyMem Silver Non-Adhesive Dressing, 4.25x4.25 in Every Other Day/30 Days ary Discharge Instructions: Apply to wound bed as instructed Secondary Dressing: Woven Gauze Sponge, Non-Sterile 4x4 in (Generic) Every Other Day/30 Days Discharge Instructions: Apply over primary dressing as directed. Compression Wrap: Kerlix Roll 4.5x3.1 (in/yd) (Generic) Every Other Day/30 Days Discharge Instructions: Apply Kerlix and Coban compression as directed. Compression Wrap: Coban Self-Adherent Wrap 4x5 (in/yd) (Generic) Every Other Day/30 Days Discharge Instructions: Apply over Kerlix as directed. Wound #42 - Lower Leg Wound Laterality: Right,  Anterior, Proximal Cleanser: Wound Cleanser (Generic) Every Other Day/30 Days Discharge Instructions: Cleanse the wound with wound cleanser prior to applying a clean dressing using gauze sponges, not tissue or cotton balls. Cleanser: Soap and Water Every Other Day/30 Days Discharge Instructions: May shower and wash wound with dial antibacterial soap and water prior to dressing change. Peri-Wound Care: Triamcinolone 15 (g) Every Other Day/30 Days Discharge Instructions: Use triamcinolone 15 (g) as directed Peri-Wound Care: Sween Lotion (Moisturizing lotion) Every Other Day/30 Days Discharge Instructions: Apply moisturizing lotion as directed Prim Dressing: PolyMem Silver Non-Adhesive Dressing, 4.25x4.25 in Every Other Day/30 Days ary Discharge Instructions: Apply to wound bed as instructed Secondary Dressing: Woven Gauze Sponge, Non-Sterile 4x4 in (Generic) Every Other Day/30 Days Discharge Instructions: Apply over primary dressing as directed. Compression Wrap: Kerlix Roll 4.5x3.1 (in/yd) (Generic) Every Other Day/30 Days Discharge Instructions: Apply Kerlix and Coban compression as directed. Compression Wrap: Coban Self-Adherent Wrap 4x5 (in/yd) (Generic) Every Other Day/30 Days Discharge Instructions: Apply over Kerlix as directed. Laboratory naerobe culture (MICRO) - right medial foot Bacteria identified in Unspecified specimen by A LOINC Code: 417-4 Convenience Name: Anerobic culture Radiology Computed Tomography (CT) Scan - WITHOUT CONTRAST RIGHT MEDIAL FOOT ; Electronic Signature(s) Signed: 02/21/2021 4:29:54 PM By: Linton Ham MD Signed: 02/21/2021 4:48:23 PM By: [name] Entered By: [name] on 02/21/2021 11:42:18 Prescription 02/21/2021 -------------------------------------------------------------------------------- [name] MD Patient Name: Provider: [birth year] [id number] Date of Birth: NPI#[name] [id number] Sex: DEA  #: [phone] Phone #: License #: [place] Patient Address: [address], [place] [address] [place], [place] [address] [phone] Allergies Sulfa Provider's Orders Computed Tomography (CT) Scan - WITHOUT CONTRAST RIGHT MEDIAL FOOT ; Hand Signature: Date(s): Electronic Signature(s) Signed: 02/21/2021 4:29:54 PM By: Linton Ham MD Signed: 02/21/2021 4:48:23 PM By: [name] Entered By: [name] on 02/21/2021 11:42:27 -------------------------------------------------------------------------------- Problem List Details Patient Name: Date of Service: [name] Foley F. 02/21/2021 10:30 A M Medical Record Number: [id number] Patient Account Number: [id number] Date of Birth/Sex: Treating RN: 1945-07-08 (75 y.o. 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 11/01/2020 No Yes S80.211D Abrasion, right knee, subsequent encounter 11/01/2020 No Yes 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  Inactive Date L89.613 Pressure ulcer of right heel, stage 3 04/17/2016 04/17/2016 L97.221 Non-pressure chronic ulcer of left calf limited to breakdown of skin 08/15/2017 08/15/2017 L97.321 Non-pressure chronic ulcer of left ankle limited to breakdown  of skin 07/01/2018 07/01/2018 L03.032 Cellulitis of left toe 01/06/2019 01/06/2019 Z32.992 Pressure ulcer of left heel, stage 3 12/04/2016 12/04/2016 I25.119 Atherosclerotic heart disease of native coronary artery with unspecified angina 04/17/2016 04/17/2016 pectoris L97.821 Non-pressure chronic ulcer of other part of left lower leg limited to breakdown of skin 01/06/2019 01/06/2019 E26.83 Acute diastolic (congestive) heart failure 04/17/2016 04/17/2016 L97.521 Non-pressure chronic ulcer of other part of left foot limited to breakdown of skin 02/17/2019 02/17/2019 S51.811D Laceration without foreign body of right forearm, subsequent encounter 10/22/2017 10/22/2017 L03.116 Cellulitis of left lower limb 12/24/2017 12/24/2017 L89.620 Pressure ulcer of left heel, unstageable 07/21/2019 07/21/2019 L97.524 Non-pressure chronic ulcer of other part of left foot with necrosis of bone 06/14/2020 06/14/2020 L97.211 Non-pressure chronic ulcer of right calf limited to breakdown of skin 07/21/2019 07/21/2019 M86.172 Other acute osteomyelitis, left ankle and foot 08/30/2020 08/30/2020 S80.211D Abrasion, right knee, subsequent encounter 08/18/2019 08/18/2019 Resolved Problems ICD-10 Code Description Active Date Resolved Date L89.512 Pressure ulcer of right ankle, stage 2 04/17/2016 04/17/2016 L89.522 Pressure ulcer of left ankle, stage 2 04/17/2016 04/17/2016 Electronic Signature(s) Signed: 02/21/2021 4:29:54 PM By: Linton Ham MD Entered By: Linton Ham on 02/21/2021 12:37:16 -------------------------------------------------------------------------------- Progress Note Details Patient Name: Date of Service: [name] Foley F. 02/21/2021 10:30 A M Medical Record Number: [id number] Patient Account Number: [id number] Date of Birth/Sex: Treating RN: [birth year] (75 y.o. [name] [name], [name] [name] Primary Care Provider: [name], [name] Other Clinician: Referring Provider: Treating Provider/Extender: Mavrick Mcquigg [name],  [name] Weeks in Treatment: 253 Subjective History of Present Illness (HPI) The following HPI elements were documented for the patient's wound: Location: On the left and right lateral forefoot which has been there for about 6 months Quality: Patient reports No Pain. Severity: Patient states wound(s) are getting worse. Duration: Patient has had the wound for > 6 months prior to seeking treatment at the wound center Context: The wound would happen gradually Modifying Factors: Patient wound(s)/ulcer(s) are worsening due to :continual drainage from the wound Associated Signs and Symptoms: Patient reports having increase discharge. This patient returns after being seen here till the end of August and he was lost to follow-up. he has been quite debilitated laying in bed most of the time and his condition has deteriorated significantly. He has multiple ulcerations on the heel lateral forefoot and some of his toes. ======== Old notes: 75 year old male known to our practice when he was seen here in February and March and was lost to follow-up when he was admitted to hospital with various medical problems including coronary artery disease and a stroke. Now returns with the problem on the left forefoot where he has an ulceration and this has been there for about 6 months. 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 He has had persistent hyperglycemia given recently. Patient has a past medical history of diabetes mellitus, hypertension,  combined systolic and diastolic heart failure, peripheral  g) Every Other Day/30 Days Discharge Instructions: Use triamcinolone 15 (g) as directed Peri-Wound Care: Sween Lotion (Moisturizing lotion) Every Other Day/30 Days Discharge Instructions: Apply moisturizing lotion as directed Prim Dressing: PolyMem Silver Non-Adhesive Dressing, 4.25x4.25 in Every Other Day/30 Days ary Discharge Instructions: Apply to wound bed as instructed Secondary Dressing: Woven Gauze Sponge, Non-Sterile 4x4 in (Generic) Every Other Day/30 Days Discharge Instructions: Apply over primary dressing as directed. Compression Wrap: Kerlix Roll 4.5x3.1 (in/yd) (Generic) Every Other Day/30 Days Discharge Instructions: Apply Kerlix and Coban compression as directed. Compression Wrap: Coban Self-Adherent Wrap 4x5 (in/yd) (Generic) Every Other Day/30 Days Discharge Instructions: Apply over Kerlix as directed. Wound #42 - Lower Leg Wound Laterality: Right,  Anterior, Proximal Cleanser: Wound Cleanser (Generic) Every Other Day/30 Days Discharge Instructions: Cleanse the wound with wound cleanser prior to applying a clean dressing using gauze sponges, not tissue or cotton balls. Cleanser: Soap and Water Every Other Day/30 Days Discharge Instructions: May shower and wash wound with dial antibacterial soap and water prior to dressing change. Peri-Wound Care: Triamcinolone 15 (g) Every Other Day/30 Days Discharge Instructions: Use triamcinolone 15 (g) as directed Peri-Wound Care: Sween Lotion (Moisturizing lotion) Every Other Day/30 Days Discharge Instructions: Apply moisturizing lotion as directed Prim Dressing: PolyMem Silver Non-Adhesive Dressing, 4.25x4.25 in Every Other Day/30 Days ary Discharge Instructions: Apply to wound bed as instructed Secondary Dressing: Woven Gauze Sponge, Non-Sterile 4x4 in (Generic) Every Other Day/30 Days Discharge Instructions: Apply over primary dressing as directed. Compression Wrap: Kerlix Roll 4.5x3.1 (in/yd) (Generic) Every Other Day/30 Days Discharge Instructions: Apply Kerlix and Coban compression as directed. Compression Wrap: Coban Self-Adherent Wrap 4x5 (in/yd) (Generic) Every Other Day/30 Days Discharge Instructions: Apply over Kerlix as directed. Laboratory naerobe culture (MICRO) - right medial foot Bacteria identified in Unspecified specimen by A LOINC Code: 417-4 Convenience Name: Anerobic culture Radiology Computed Tomography (CT) Scan - WITHOUT CONTRAST RIGHT MEDIAL FOOT ; Electronic Signature(s) Signed: 02/21/2021 4:29:54 PM By: Linton Ham MD Signed: 02/21/2021 4:48:23 PM By: [name] Entered By: [name] on 02/21/2021 11:42:18 Prescription 02/21/2021 -------------------------------------------------------------------------------- [name] MD Patient Name: Provider: [birth year] [id number] Date of Birth: NPI#[name] [id number] Sex: DEA  #: [phone] Phone #: License #: [place] Patient Address: [address], [place] [address] [place], [place] [address] [phone] Allergies Sulfa Provider's Orders Computed Tomography (CT) Scan - WITHOUT CONTRAST RIGHT MEDIAL FOOT ; Hand Signature: Date(s): Electronic Signature(s) Signed: 02/21/2021 4:29:54 PM By: Linton Ham MD Signed: 02/21/2021 4:48:23 PM By: [name] Entered By: [name] on 02/21/2021 11:42:27 -------------------------------------------------------------------------------- Problem List Details Patient Name: Date of Service: [name] Foley F. 02/21/2021 10:30 A M Medical Record Number: [id number] Patient Account Number: [id number] Date of Birth/Sex: Treating RN: 1945-07-08 (75 y.o. 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 11/01/2020 No Yes S80.211D Abrasion, right knee, subsequent encounter 11/01/2020 No Yes 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  Inactive Date L89.613 Pressure ulcer of right heel, stage 3 04/17/2016 04/17/2016 L97.221 Non-pressure chronic ulcer of left calf limited to breakdown of skin 08/15/2017 08/15/2017 L97.321 Non-pressure chronic ulcer of left ankle limited to breakdown  of skin 07/01/2018 07/01/2018 L03.032 Cellulitis of left toe 01/06/2019 01/06/2019 Z32.992 Pressure ulcer of left heel, stage 3 12/04/2016 12/04/2016 I25.119 Atherosclerotic heart disease of native coronary artery with unspecified angina 04/17/2016 04/17/2016 pectoris L97.821 Non-pressure chronic ulcer of other part of left lower leg limited to breakdown of skin 01/06/2019 01/06/2019 E26.83 Acute diastolic (congestive) heart failure 04/17/2016 04/17/2016 L97.521 Non-pressure chronic ulcer of other part of left foot limited to breakdown of skin 02/17/2019 02/17/2019 S51.811D Laceration without foreign body of right forearm, subsequent encounter 10/22/2017 10/22/2017 L03.116 Cellulitis of left lower limb 12/24/2017 12/24/2017 L89.620 Pressure ulcer of left heel, unstageable 07/21/2019 07/21/2019 L97.524 Non-pressure chronic ulcer of other part of left foot with necrosis of bone 06/14/2020 06/14/2020 L97.211 Non-pressure chronic ulcer of right calf limited to breakdown of skin 07/21/2019 07/21/2019 M86.172 Other acute osteomyelitis, left ankle and foot 08/30/2020 08/30/2020 S80.211D Abrasion, right knee, subsequent encounter 08/18/2019 08/18/2019 Resolved Problems ICD-10 Code Description Active Date Resolved Date L89.512 Pressure ulcer of right ankle, stage 2 04/17/2016 04/17/2016 L89.522 Pressure ulcer of left ankle, stage 2 04/17/2016 04/17/2016 Electronic Signature(s) Signed: 02/21/2021 4:29:54 PM By: Linton Ham MD Entered By: Linton Ham on 02/21/2021 12:37:16 -------------------------------------------------------------------------------- Progress Note Details Patient Name: Date of Service: [name] Foley F. 02/21/2021 10:30 A M Medical Record Number: [id number] Patient Account Number: [id number] Date of Birth/Sex: Treating RN: [birth year] (75 y.o. [name] [name], [name] [name] Primary Care Provider: [name], [name] Other Clinician: Referring Provider: Treating Provider/Extender: Mavrick Mcquigg [name],  [name] Weeks in Treatment: 253 Subjective History of Present Illness (HPI) The following HPI elements were documented for the patient's wound: Location: On the left and right lateral forefoot which has been there for about 6 months Quality: Patient reports No Pain. Severity: Patient states wound(s) are getting worse. Duration: Patient has had the wound for > 6 months prior to seeking treatment at the wound center Context: The wound would happen gradually Modifying Factors: Patient wound(s)/ulcer(s) are worsening due to :continual drainage from the wound Associated Signs and Symptoms: Patient reports having increase discharge. This patient returns after being seen here till the end of August and he was lost to follow-up. he has been quite debilitated laying in bed most of the time and his condition has deteriorated significantly. He has multiple ulcerations on the heel lateral forefoot and some of his toes. ======== Old notes: 75 year old male known to our practice when he was seen here in February and March and was lost to follow-up when he was admitted to hospital with various medical problems including coronary artery disease and a stroke. Now returns with the problem on the left forefoot where he has an ulceration and this has been there for about 6 months. 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 He has had persistent hyperglycemia given recently. Patient has a past medical history of diabetes mellitus, hypertension,  combined systolic and diastolic heart failure, peripheral  Inactive Date L89.613 Pressure ulcer of right heel, stage 3 04/17/2016 04/17/2016 L97.221 Non-pressure chronic ulcer of left calf limited to breakdown of skin 08/15/2017 08/15/2017 L97.321 Non-pressure chronic ulcer of left ankle limited to breakdown  of skin 07/01/2018 07/01/2018 L03.032 Cellulitis of left toe 01/06/2019 01/06/2019 Z32.992 Pressure ulcer of left heel, stage 3 12/04/2016 12/04/2016 I25.119 Atherosclerotic heart disease of native coronary artery with unspecified angina 04/17/2016 04/17/2016 pectoris L97.821 Non-pressure chronic ulcer of other part of left lower leg limited to breakdown of skin 01/06/2019 01/06/2019 E26.83 Acute diastolic (congestive) heart failure 04/17/2016 04/17/2016 L97.521 Non-pressure chronic ulcer of other part of left foot limited to breakdown of skin 02/17/2019 02/17/2019 S51.811D Laceration without foreign body of right forearm, subsequent encounter 10/22/2017 10/22/2017 L03.116 Cellulitis of left lower limb 12/24/2017 12/24/2017 L89.620 Pressure ulcer of left heel, unstageable 07/21/2019 07/21/2019 L97.524 Non-pressure chronic ulcer of other part of left foot with necrosis of bone 06/14/2020 06/14/2020 L97.211 Non-pressure chronic ulcer of right calf limited to breakdown of skin 07/21/2019 07/21/2019 M86.172 Other acute osteomyelitis, left ankle and foot 08/30/2020 08/30/2020 S80.211D Abrasion, right knee, subsequent encounter 08/18/2019 08/18/2019 Resolved Problems ICD-10 Code Description Active Date Resolved Date L89.512 Pressure ulcer of right ankle, stage 2 04/17/2016 04/17/2016 L89.522 Pressure ulcer of left ankle, stage 2 04/17/2016 04/17/2016 Electronic Signature(s) Signed: 02/21/2021 4:29:54 PM By: Linton Ham MD Entered By: Linton Ham on 02/21/2021 12:37:16 -------------------------------------------------------------------------------- Progress Note Details Patient Name: Date of Service: [name] Foley F. 02/21/2021 10:30 A M Medical Record Number: [id number] Patient Account Number: [id number] Date of Birth/Sex: Treating RN: [birth year] (75 y.o. [name] [name], [name] [name] Primary Care Provider: [name], [name] Other Clinician: Referring Provider: Treating Provider/Extender: Mavrick Mcquigg [name],  [name] Weeks in Treatment: 253 Subjective History of Present Illness (HPI) The following HPI elements were documented for the patient's wound: Location: On the left and right lateral forefoot which has been there for about 6 months Quality: Patient reports No Pain. Severity: Patient states wound(s) are getting worse. Duration: Patient has had the wound for > 6 months prior to seeking treatment at the wound center Context: The wound would happen gradually Modifying Factors: Patient wound(s)/ulcer(s) are worsening due to :continual drainage from the wound Associated Signs and Symptoms: Patient reports having increase discharge. This patient returns after being seen here till the end of August and he was lost to follow-up. he has been quite debilitated laying in bed most of the time and his condition has deteriorated significantly. He has multiple ulcerations on the heel lateral forefoot and some of his toes. ======== Old notes: 75 year old male known to our practice when he was seen here in February and March and was lost to follow-up when he was admitted to hospital with various medical problems including coronary artery disease and a stroke. Now returns with the problem on the left forefoot where he has an ulceration and this has been there for about 6 months. 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 He has had persistent hyperglycemia given recently. Patient has a past medical history of diabetes mellitus, hypertension,  combined systolic and diastolic heart failure, peripheral  Coban Self-Adherent Wrap 4x5 (in/yd) (Generic) Every Other Day/30 Days Discharge Instructions: Apply over Kerlix as directed. WOUND #41: - Lower Leg Wound Laterality: Right, Anterior, Distal Cleanser: Wound Cleanser (Generic) Every Other Day/30 Days Discharge Instructions: Cleanse the wound with wound cleanser prior to applying a clean dressing using gauze sponges, not tissue or cotton balls. Cleanser: Soap and Water Every Other Day/30 Days Discharge Instructions: May shower and wash wound with dial antibacterial soap and water prior to dressing change. Peri-Wound Care: Triamcinolone 15 (g) Every Other Day/30 Days Discharge Instructions: Use triamcinolone 15 (g) as directed Peri-Wound Care: Sween Lotion (Moisturizing lotion) Every Other Day/30 Days Discharge Instructions: Apply moisturizing lotion as directed Prim Dressing: PolyMem Silver Non-Adhesive Dressing, 4.25x4.25 in Every Other Day/30 Days ary Discharge Instructions: Apply to wound bed as instructed Secondary Dressing: Woven Gauze Sponge, Non-Sterile 4x4 in (Generic) Every Other Day/30 Days Discharge Instructions: Apply over primary dressing as directed. Com pression Wrap: Kerlix Roll 4.5x3.1 (in/yd) (Generic) Every Other Day/30 Days Discharge Instructions: Apply Kerlix and Coban compression as directed. Com pression Wrap: Coban Self-Adherent Wrap 4x5 (in/yd) (Generic) Every Other Day/30 Days Discharge Instructions: Apply over Kerlix as directed. WOUND #42: - Lower Leg Wound Laterality: Right, Anterior,  Proximal Cleanser: Wound Cleanser (Generic) Every Other Day/30 Days Discharge Instructions: Cleanse the wound with wound cleanser prior to applying a clean dressing using gauze sponges, not tissue or cotton balls. Cleanser: Soap and Water Every Other Day/30 Days Discharge Instructions: May shower and wash wound with dial antibacterial soap and water prior to dressing change. Peri-Wound Care: Triamcinolone 15 (g) Every Other Day/30 Days Discharge Instructions: Use triamcinolone 15 (g) as directed Peri-Wound Care: Sween Lotion (Moisturizing lotion) Every Other Day/30 Days Discharge Instructions: Apply moisturizing lotion as directed Prim Dressing: PolyMem Silver Non-Adhesive Dressing, 4.25x4.25 in Every Other Day/30 Days ary Discharge Instructions: Apply to wound bed as instructed Secondary Dressing: Woven Gauze Sponge, Non-Sterile 4x4 in (Generic) Every Other Day/30 Days Discharge Instructions: Apply over primary dressing as directed. Com pression Wrap: Kerlix Roll 4.5x3.1 (in/yd) (Generic) Every Other Day/30 Days Discharge Instructions: Apply Kerlix and Coban compression as directed. Com pression Wrap: Coban Self-Adherent Wrap 4x5 (in/yd) (Generic) Every Other Day/30 Days Discharge Instructions: Apply over Kerlix as directed. 1. We are using polymen Ag to the heel right lateral calf 2. Silver alginate to the dorsal foot which is a lot worse today. 3. He is going to require a CT scan of the right foot rule out osteomyelitis 4. Vascular surgery seems to be saying that the patient's popliteal artery and posterior tibial artery angioplasties are patent and that he should have enough blood flow to heal these wounds. Notable for the fact that his TBI was quite normal Electronic Signature(s) Signed: 02/21/2021 4:29:54 PM By: Linton Ham MD Entered By: Linton Ham on 02/21/2021 12:41:45 -------------------------------------------------------------------------------- SuperBill  Details Patient Name: Date of Service: [name] Foley F. 02/21/2021 Medical Record Number: [id number] Patient Account Number: [id number] Date of Birth/Sex: Treating RN: 03-23-1946 (75 y.o. [name] [name], [name] [name] Primary Care Provider: [name], [name] Other Clinician: Referring Provider: Treating Provider/Extender: Diago Haik [name], [name] Weeks in Treatment: 253 Diagnosis Coding ICD-10 Codes Code Description E11.621 Type 2 diabetes mellitus with foot ulcer L97.518 Non-pressure chronic ulcer of other part of right foot with other specified severity L97.218 Non-pressure chronic ulcer of right calf with other specified severity S80.211D Abrasion, right knee, subsequent encounter L97.418 Non-pressure chronic ulcer of right heel and midfoot with other specified severity S80.211D Abrasion, right knee, subsequent encounter Facility Procedures  Inactive Date L89.613 Pressure ulcer of right heel, stage 3 04/17/2016 04/17/2016 L97.221 Non-pressure chronic ulcer of left calf limited to breakdown of skin 08/15/2017 08/15/2017 L97.321 Non-pressure chronic ulcer of left ankle limited to breakdown  of skin 07/01/2018 07/01/2018 L03.032 Cellulitis of left toe 01/06/2019 01/06/2019 Z32.992 Pressure ulcer of left heel, stage 3 12/04/2016 12/04/2016 I25.119 Atherosclerotic heart disease of native coronary artery with unspecified angina 04/17/2016 04/17/2016 pectoris L97.821 Non-pressure chronic ulcer of other part of left lower leg limited to breakdown of skin 01/06/2019 01/06/2019 E26.83 Acute diastolic (congestive) heart failure 04/17/2016 04/17/2016 L97.521 Non-pressure chronic ulcer of other part of left foot limited to breakdown of skin 02/17/2019 02/17/2019 S51.811D Laceration without foreign body of right forearm, subsequent encounter 10/22/2017 10/22/2017 L03.116 Cellulitis of left lower limb 12/24/2017 12/24/2017 L89.620 Pressure ulcer of left heel, unstageable 07/21/2019 07/21/2019 L97.524 Non-pressure chronic ulcer of other part of left foot with necrosis of bone 06/14/2020 06/14/2020 L97.211 Non-pressure chronic ulcer of right calf limited to breakdown of skin 07/21/2019 07/21/2019 M86.172 Other acute osteomyelitis, left ankle and foot 08/30/2020 08/30/2020 S80.211D Abrasion, right knee, subsequent encounter 08/18/2019 08/18/2019 Resolved Problems ICD-10 Code Description Active Date Resolved Date L89.512 Pressure ulcer of right ankle, stage 2 04/17/2016 04/17/2016 L89.522 Pressure ulcer of left ankle, stage 2 04/17/2016 04/17/2016 Electronic Signature(s) Signed: 02/21/2021 4:29:54 PM By: Linton Ham MD Entered By: Linton Ham on 02/21/2021 12:37:16 -------------------------------------------------------------------------------- Progress Note Details Patient Name: Date of Service: [name] Foley F. 02/21/2021 10:30 A M Medical Record Number: [id number] Patient Account Number: [id number] Date of Birth/Sex: Treating RN: [birth year] (75 y.o. [name] [name], [name] [name] Primary Care Provider: [name], [name] Other Clinician: Referring Provider: Treating Provider/Extender: Mavrick Mcquigg [name],  [name] Weeks in Treatment: 253 Subjective History of Present Illness (HPI) The following HPI elements were documented for the patient's wound: Location: On the left and right lateral forefoot which has been there for about 6 months Quality: Patient reports No Pain. Severity: Patient states wound(s) are getting worse. Duration: Patient has had the wound for > 6 months prior to seeking treatment at the wound center Context: The wound would happen gradually Modifying Factors: Patient wound(s)/ulcer(s) are worsening due to :continual drainage from the wound Associated Signs and Symptoms: Patient reports having increase discharge. This patient returns after being seen here till the end of August and he was lost to follow-up. he has been quite debilitated laying in bed most of the time and his condition has deteriorated significantly. He has multiple ulcerations on the heel lateral forefoot and some of his toes. ======== Old notes: 75 year old male known to our practice when he was seen here in February and March and was lost to follow-up when he was admitted to hospital with various medical problems including coronary artery disease and a stroke. Now returns with the problem on the left forefoot where he has an ulceration and this has been there for about 6 months. 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 He has had persistent hyperglycemia given recently. Patient has a past medical history of diabetes mellitus, hypertension,  combined systolic and diastolic heart failure, peripheral  g) Every Other Day/30 Days Discharge Instructions: Use triamcinolone 15 (g) as directed Peri-Wound Care: Sween Lotion (Moisturizing lotion) Every Other Day/30 Days Discharge Instructions: Apply moisturizing lotion as directed Prim Dressing: PolyMem Silver Non-Adhesive Dressing, 4.25x4.25 in Every Other Day/30 Days ary Discharge Instructions: Apply to wound bed as instructed Secondary Dressing: Woven Gauze Sponge, Non-Sterile 4x4 in (Generic) Every Other Day/30 Days Discharge Instructions: Apply over primary dressing as directed. Compression Wrap: Kerlix Roll 4.5x3.1 (in/yd) (Generic) Every Other Day/30 Days Discharge Instructions: Apply Kerlix and Coban compression as directed. Compression Wrap: Coban Self-Adherent Wrap 4x5 (in/yd) (Generic) Every Other Day/30 Days Discharge Instructions: Apply over Kerlix as directed. Wound #42 - Lower Leg Wound Laterality: Right,  Anterior, Proximal Cleanser: Wound Cleanser (Generic) Every Other Day/30 Days Discharge Instructions: Cleanse the wound with wound cleanser prior to applying a clean dressing using gauze sponges, not tissue or cotton balls. Cleanser: Soap and Water Every Other Day/30 Days Discharge Instructions: May shower and wash wound with dial antibacterial soap and water prior to dressing change. Peri-Wound Care: Triamcinolone 15 (g) Every Other Day/30 Days Discharge Instructions: Use triamcinolone 15 (g) as directed Peri-Wound Care: Sween Lotion (Moisturizing lotion) Every Other Day/30 Days Discharge Instructions: Apply moisturizing lotion as directed Prim Dressing: PolyMem Silver Non-Adhesive Dressing, 4.25x4.25 in Every Other Day/30 Days ary Discharge Instructions: Apply to wound bed as instructed Secondary Dressing: Woven Gauze Sponge, Non-Sterile 4x4 in (Generic) Every Other Day/30 Days Discharge Instructions: Apply over primary dressing as directed. Compression Wrap: Kerlix Roll 4.5x3.1 (in/yd) (Generic) Every Other Day/30 Days Discharge Instructions: Apply Kerlix and Coban compression as directed. Compression Wrap: Coban Self-Adherent Wrap 4x5 (in/yd) (Generic) Every Other Day/30 Days Discharge Instructions: Apply over Kerlix as directed. Laboratory naerobe culture (MICRO) - right medial foot Bacteria identified in Unspecified specimen by A LOINC Code: 417-4 Convenience Name: Anerobic culture Radiology Computed Tomography (CT) Scan - WITHOUT CONTRAST RIGHT MEDIAL FOOT ; Electronic Signature(s) Signed: 02/21/2021 4:29:54 PM By: Linton Ham MD Signed: 02/21/2021 4:48:23 PM By: [name] Entered By: [name] on 02/21/2021 11:42:18 Prescription 02/21/2021 -------------------------------------------------------------------------------- [name] MD Patient Name: Provider: [birth year] [id number] Date of Birth: NPI#[name] [id number] Sex: DEA  #: [phone] Phone #: License #: [place] Patient Address: [address], [place] [address] [place], [place] [address] [phone] Allergies Sulfa Provider's Orders Computed Tomography (CT) Scan - WITHOUT CONTRAST RIGHT MEDIAL FOOT ; Hand Signature: Date(s): Electronic Signature(s) Signed: 02/21/2021 4:29:54 PM By: Linton Ham MD Signed: 02/21/2021 4:48:23 PM By: [name] Entered By: [name] on 02/21/2021 11:42:27 -------------------------------------------------------------------------------- Problem List Details Patient Name: Date of Service: [name] Foley F. 02/21/2021 10:30 A M Medical Record Number: [id number] Patient Account Number: [id number] Date of Birth/Sex: Treating RN: 1945-07-08 (75 y.o. 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 11/01/2020 No Yes S80.211D Abrasion, right knee, subsequent encounter 11/01/2020 No Yes 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  g) Every Other Day/30 Days Discharge Instructions: Use triamcinolone 15 (g) as directed Peri-Wound Care: Sween Lotion (Moisturizing lotion) Every Other Day/30 Days Discharge Instructions: Apply moisturizing lotion as directed Prim Dressing: PolyMem Silver Non-Adhesive Dressing, 4.25x4.25 in Every Other Day/30 Days ary Discharge Instructions: Apply to wound bed as instructed Secondary Dressing: Woven Gauze Sponge, Non-Sterile 4x4 in (Generic) Every Other Day/30 Days Discharge Instructions: Apply over primary dressing as directed. Compression Wrap: Kerlix Roll 4.5x3.1 (in/yd) (Generic) Every Other Day/30 Days Discharge Instructions: Apply Kerlix and Coban compression as directed. Compression Wrap: Coban Self-Adherent Wrap 4x5 (in/yd) (Generic) Every Other Day/30 Days Discharge Instructions: Apply over Kerlix as directed. Wound #42 - Lower Leg Wound Laterality: Right,  Anterior, Proximal Cleanser: Wound Cleanser (Generic) Every Other Day/30 Days Discharge Instructions: Cleanse the wound with wound cleanser prior to applying a clean dressing using gauze sponges, not tissue or cotton balls. Cleanser: Soap and Water Every Other Day/30 Days Discharge Instructions: May shower and wash wound with dial antibacterial soap and water prior to dressing change. Peri-Wound Care: Triamcinolone 15 (g) Every Other Day/30 Days Discharge Instructions: Use triamcinolone 15 (g) as directed Peri-Wound Care: Sween Lotion (Moisturizing lotion) Every Other Day/30 Days Discharge Instructions: Apply moisturizing lotion as directed Prim Dressing: PolyMem Silver Non-Adhesive Dressing, 4.25x4.25 in Every Other Day/30 Days ary Discharge Instructions: Apply to wound bed as instructed Secondary Dressing: Woven Gauze Sponge, Non-Sterile 4x4 in (Generic) Every Other Day/30 Days Discharge Instructions: Apply over primary dressing as directed. Compression Wrap: Kerlix Roll 4.5x3.1 (in/yd) (Generic) Every Other Day/30 Days Discharge Instructions: Apply Kerlix and Coban compression as directed. Compression Wrap: Coban Self-Adherent Wrap 4x5 (in/yd) (Generic) Every Other Day/30 Days Discharge Instructions: Apply over Kerlix as directed. Laboratory naerobe culture (MICRO) - right medial foot Bacteria identified in Unspecified specimen by A LOINC Code: 417-4 Convenience Name: Anerobic culture Radiology Computed Tomography (CT) Scan - WITHOUT CONTRAST RIGHT MEDIAL FOOT ; Electronic Signature(s) Signed: 02/21/2021 4:29:54 PM By: Linton Ham MD Signed: 02/21/2021 4:48:23 PM By: [name] Entered By: [name] on 02/21/2021 11:42:18 Prescription 02/21/2021 -------------------------------------------------------------------------------- [name] MD Patient Name: Provider: [birth year] [id number] Date of Birth: NPI#[name] [id number] Sex: DEA  #: [phone] Phone #: License #: [place] Patient Address: [address], [place] [address] [place], [place] [address] [phone] Allergies Sulfa Provider's Orders Computed Tomography (CT) Scan - WITHOUT CONTRAST RIGHT MEDIAL FOOT ; Hand Signature: Date(s): Electronic Signature(s) Signed: 02/21/2021 4:29:54 PM By: Linton Ham MD Signed: 02/21/2021 4:48:23 PM By: [name] Entered By: [name] on 02/21/2021 11:42:27 -------------------------------------------------------------------------------- Problem List Details Patient Name: Date of Service: [name] Foley F. 02/21/2021 10:30 A M Medical Record Number: [id number] Patient Account Number: [id number] Date of Birth/Sex: Treating RN: 1945-07-08 (75 y.o. 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 11/01/2020 No Yes S80.211D Abrasion, right knee, subsequent encounter 11/01/2020 No Yes 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 WOUND #41: - Lower Leg Wound Laterality: Right, Anterior, Distal Cleanser: Wound Cleanser (Generic) Every Other Day/30 Days Discharge Instructions: Cleanse the wound with wound cleanser prior to applying a clean dressing using gauze sponges, not tissue or cotton balls. Cleanser: Soap and Water Every Other Day/30 Days Discharge Instructions: May shower and wash wound with dial antibacterial soap and water prior to dressing change. Peri-Wound Care: Triamcinolone 15 (g) Every Other Day/30 Days Discharge Instructions: Use triamcinolone 15 (g) as directed Peri-Wound Care: Sween Lotion (Moisturizing lotion) Every Other Day/30 Days Discharge Instructions: Apply moisturizing lotion as directed Prim Dressing: PolyMem Silver Non-Adhesive Dressing, 4.25x4.25 in Every Other Day/30 Days ary Discharge Instructions: Apply to wound bed as instructed Secondary Dressing: Woven Gauze Sponge, Non-Sterile 4x4 in (Generic) Every Other Day/30 Days Discharge Instructions: Apply over primary dressing as directed. Com pression Wrap: Kerlix Roll 4.5x3.1 (in/yd) (Generic) Every Other Day/30 Days Discharge Instructions: Apply Kerlix and Coban compression as directed. Com pression Wrap: Coban Self-Adherent Wrap 4x5 (in/yd) (Generic) Every Other Day/30 Days Discharge Instructions: Apply over Kerlix as directed. WOUND #42: - Lower Leg Wound Laterality: Right, Anterior,  Proximal Cleanser: Wound Cleanser (Generic) Every Other Day/30 Days Discharge Instructions: Cleanse the wound with wound cleanser prior to applying a clean dressing using gauze sponges, not tissue or cotton balls. Cleanser: Soap and Water Every Other Day/30 Days Discharge Instructions: May shower and wash wound with dial antibacterial soap and water prior to dressing change. Peri-Wound Care: Triamcinolone 15 (g) Every Other Day/30 Days Discharge Instructions: Use triamcinolone 15 (g) as directed Peri-Wound Care: Sween Lotion (Moisturizing lotion) Every Other Day/30 Days Discharge Instructions: Apply moisturizing lotion as directed Prim Dressing: PolyMem Silver Non-Adhesive Dressing, 4.25x4.25 in Every Other Day/30 Days ary Discharge Instructions: Apply to wound bed as instructed Secondary Dressing: Woven Gauze Sponge, Non-Sterile 4x4 in (Generic) Every Other Day/30 Days Discharge Instructions: Apply over primary dressing as directed. Com pression Wrap: Kerlix Roll 4.5x3.1 (in/yd) (Generic) Every Other Day/30 Days Discharge Instructions: Apply Kerlix and Coban compression as directed. Com pression Wrap: Coban Self-Adherent Wrap 4x5 (in/yd) (Generic) Every Other Day/30 Days Discharge Instructions: Apply over Kerlix as directed. 1. We are using polymen Ag to the heel right lateral calf 2. Silver alginate to the dorsal foot which is a lot worse today. 3. He is going to require a CT scan of the right foot rule out osteomyelitis 4. Vascular surgery seems to be saying that the patient's popliteal artery and posterior tibial artery angioplasties are patent and that he should have enough blood flow to heal these wounds. Notable for the fact that his TBI was quite normal Electronic Signature(s) Signed: 02/21/2021 4:29:54 PM By: Linton Ham MD Entered By: Linton Ham on 02/21/2021 12:41:45 -------------------------------------------------------------------------------- SuperBill  Details Patient Name: Date of Service: [name] Foley F. 02/21/2021 Medical Record Number: [id number] Patient Account Number: [id number] Date of Birth/Sex: Treating RN: 03-23-1946 (75 y.o. [name] [name], [name] [name] Primary Care Provider: [name], [name] Other Clinician: Referring Provider: Treating Provider/Extender: Diago Haik [name], [name] Weeks in Treatment: 253 Diagnosis Coding ICD-10 Codes Code Description E11.621 Type 2 diabetes mellitus with foot ulcer L97.518 Non-pressure chronic ulcer of other part of right foot with other specified severity L97.218 Non-pressure chronic ulcer of right calf with other specified severity S80.211D Abrasion, right knee, subsequent encounter L97.418 Non-pressure chronic ulcer of right heel and midfoot with other specified severity S80.211D Abrasion, right knee, subsequent encounter Facility Procedures  Inactive Date L89.613 Pressure ulcer of right heel, stage 3 04/17/2016 04/17/2016 L97.221 Non-pressure chronic ulcer of left calf limited to breakdown of skin 08/15/2017 08/15/2017 L97.321 Non-pressure chronic ulcer of left ankle limited to breakdown  of skin 07/01/2018 07/01/2018 L03.032 Cellulitis of left toe 01/06/2019 01/06/2019 Z32.992 Pressure ulcer of left heel, stage 3 12/04/2016 12/04/2016 I25.119 Atherosclerotic heart disease of native coronary artery with unspecified angina 04/17/2016 04/17/2016 pectoris L97.821 Non-pressure chronic ulcer of other part of left lower leg limited to breakdown of skin 01/06/2019 01/06/2019 E26.83 Acute diastolic (congestive) heart failure 04/17/2016 04/17/2016 L97.521 Non-pressure chronic ulcer of other part of left foot limited to breakdown of skin 02/17/2019 02/17/2019 S51.811D Laceration without foreign body of right forearm, subsequent encounter 10/22/2017 10/22/2017 L03.116 Cellulitis of left lower limb 12/24/2017 12/24/2017 L89.620 Pressure ulcer of left heel, unstageable 07/21/2019 07/21/2019 L97.524 Non-pressure chronic ulcer of other part of left foot with necrosis of bone 06/14/2020 06/14/2020 L97.211 Non-pressure chronic ulcer of right calf limited to breakdown of skin 07/21/2019 07/21/2019 M86.172 Other acute osteomyelitis, left ankle and foot 08/30/2020 08/30/2020 S80.211D Abrasion, right knee, subsequent encounter 08/18/2019 08/18/2019 Resolved Problems ICD-10 Code Description Active Date Resolved Date L89.512 Pressure ulcer of right ankle, stage 2 04/17/2016 04/17/2016 L89.522 Pressure ulcer of left ankle, stage 2 04/17/2016 04/17/2016 Electronic Signature(s) Signed: 02/21/2021 4:29:54 PM By: Linton Ham MD Entered By: Linton Ham on 02/21/2021 12:37:16 -------------------------------------------------------------------------------- Progress Note Details Patient Name: Date of Service: [name] Foley F. 02/21/2021 10:30 A M Medical Record Number: [id number] Patient Account Number: [id number] Date of Birth/Sex: Treating RN: [birth year] (75 y.o. [name] [name], [name] [name] Primary Care Provider: [name], [name] Other Clinician: Referring Provider: Treating Provider/Extender: Mavrick Mcquigg [name],  [name] Weeks in Treatment: 253 Subjective History of Present Illness (HPI) The following HPI elements were documented for the patient's wound: Location: On the left and right lateral forefoot which has been there for about 6 months Quality: Patient reports No Pain. Severity: Patient states wound(s) are getting worse. Duration: Patient has had the wound for > 6 months prior to seeking treatment at the wound center Context: The wound would happen gradually Modifying Factors: Patient wound(s)/ulcer(s) are worsening due to :continual drainage from the wound Associated Signs and Symptoms: Patient reports having increase discharge. This patient returns after being seen here till the end of August and he was lost to follow-up. he has been quite debilitated laying in bed most of the time and his condition has deteriorated significantly. He has multiple ulcerations on the heel lateral forefoot and some of his toes. ======== Old notes: 75 year old male known to our practice when he was seen here in February and March and was lost to follow-up when he was admitted to hospital with various medical problems including coronary artery disease and a stroke. Now returns with the problem on the left forefoot where he has an ulceration and this has been there for about 6 months. 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 He has had persistent hyperglycemia given recently. Patient has a past medical history of diabetes mellitus, hypertension,  combined systolic and diastolic heart failure, peripheral  g) Every Other Day/30 Days Discharge Instructions: Use triamcinolone 15 (g) as directed Peri-Wound Care: Sween Lotion (Moisturizing lotion) Every Other Day/30 Days Discharge Instructions: Apply moisturizing lotion as directed Prim Dressing: PolyMem Silver Non-Adhesive Dressing, 4.25x4.25 in Every Other Day/30 Days ary Discharge Instructions: Apply to wound bed as instructed Secondary Dressing: Woven Gauze Sponge, Non-Sterile 4x4 in (Generic) Every Other Day/30 Days Discharge Instructions: Apply over primary dressing as directed. Compression Wrap: Kerlix Roll 4.5x3.1 (in/yd) (Generic) Every Other Day/30 Days Discharge Instructions: Apply Kerlix and Coban compression as directed. Compression Wrap: Coban Self-Adherent Wrap 4x5 (in/yd) (Generic) Every Other Day/30 Days Discharge Instructions: Apply over Kerlix as directed. Wound #42 - Lower Leg Wound Laterality: Right,  Anterior, Proximal Cleanser: Wound Cleanser (Generic) Every Other Day/30 Days Discharge Instructions: Cleanse the wound with wound cleanser prior to applying a clean dressing using gauze sponges, not tissue or cotton balls. Cleanser: Soap and Water Every Other Day/30 Days Discharge Instructions: May shower and wash wound with dial antibacterial soap and water prior to dressing change. Peri-Wound Care: Triamcinolone 15 (g) Every Other Day/30 Days Discharge Instructions: Use triamcinolone 15 (g) as directed Peri-Wound Care: Sween Lotion (Moisturizing lotion) Every Other Day/30 Days Discharge Instructions: Apply moisturizing lotion as directed Prim Dressing: PolyMem Silver Non-Adhesive Dressing, 4.25x4.25 in Every Other Day/30 Days ary Discharge Instructions: Apply to wound bed as instructed Secondary Dressing: Woven Gauze Sponge, Non-Sterile 4x4 in (Generic) Every Other Day/30 Days Discharge Instructions: Apply over primary dressing as directed. Compression Wrap: Kerlix Roll 4.5x3.1 (in/yd) (Generic) Every Other Day/30 Days Discharge Instructions: Apply Kerlix and Coban compression as directed. Compression Wrap: Coban Self-Adherent Wrap 4x5 (in/yd) (Generic) Every Other Day/30 Days Discharge Instructions: Apply over Kerlix as directed. Laboratory naerobe culture (MICRO) - right medial foot Bacteria identified in Unspecified specimen by A LOINC Code: 417-4 Convenience Name: Anerobic culture Radiology Computed Tomography (CT) Scan - WITHOUT CONTRAST RIGHT MEDIAL FOOT ; Electronic Signature(s) Signed: 02/21/2021 4:29:54 PM By: Linton Ham MD Signed: 02/21/2021 4:48:23 PM By: [name] Entered By: [name] on 02/21/2021 11:42:18 Prescription 02/21/2021 -------------------------------------------------------------------------------- [name] MD Patient Name: Provider: [birth year] [id number] Date of Birth: NPI#[name] [id number] Sex: DEA  #: [phone] Phone #: License #: [place] Patient Address: [address], [place] [address] [place], [place] [address] [phone] Allergies Sulfa Provider's Orders Computed Tomography (CT) Scan - WITHOUT CONTRAST RIGHT MEDIAL FOOT ; Hand Signature: Date(s): Electronic Signature(s) Signed: 02/21/2021 4:29:54 PM By: Linton Ham MD Signed: 02/21/2021 4:48:23 PM By: [name] Entered By: [name] on 02/21/2021 11:42:27 -------------------------------------------------------------------------------- Problem List Details Patient Name: Date of Service: [name] Foley F. 02/21/2021 10:30 A M Medical Record Number: [id number] Patient Account Number: [id number] Date of Birth/Sex: Treating RN: 1945-07-08 (75 y.o. 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 11/01/2020 No Yes S80.211D Abrasion, right knee, subsequent encounter 11/01/2020 No Yes 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  g) Every Other Day/30 Days Discharge Instructions: Use triamcinolone 15 (g) as directed Peri-Wound Care: Sween Lotion (Moisturizing lotion) Every Other Day/30 Days Discharge Instructions: Apply moisturizing lotion as directed Prim Dressing: PolyMem Silver Non-Adhesive Dressing, 4.25x4.25 in Every Other Day/30 Days ary Discharge Instructions: Apply to wound bed as instructed Secondary Dressing: Woven Gauze Sponge, Non-Sterile 4x4 in (Generic) Every Other Day/30 Days Discharge Instructions: Apply over primary dressing as directed. Compression Wrap: Kerlix Roll 4.5x3.1 (in/yd) (Generic) Every Other Day/30 Days Discharge Instructions: Apply Kerlix and Coban compression as directed. Compression Wrap: Coban Self-Adherent Wrap 4x5 (in/yd) (Generic) Every Other Day/30 Days Discharge Instructions: Apply over Kerlix as directed. Wound #42 - Lower Leg Wound Laterality: Right,  Anterior, Proximal Cleanser: Wound Cleanser (Generic) Every Other Day/30 Days Discharge Instructions: Cleanse the wound with wound cleanser prior to applying a clean dressing using gauze sponges, not tissue or cotton balls. Cleanser: Soap and Water Every Other Day/30 Days Discharge Instructions: May shower and wash wound with dial antibacterial soap and water prior to dressing change. Peri-Wound Care: Triamcinolone 15 (g) Every Other Day/30 Days Discharge Instructions: Use triamcinolone 15 (g) as directed Peri-Wound Care: Sween Lotion (Moisturizing lotion) Every Other Day/30 Days Discharge Instructions: Apply moisturizing lotion as directed Prim Dressing: PolyMem Silver Non-Adhesive Dressing, 4.25x4.25 in Every Other Day/30 Days ary Discharge Instructions: Apply to wound bed as instructed Secondary Dressing: Woven Gauze Sponge, Non-Sterile 4x4 in (Generic) Every Other Day/30 Days Discharge Instructions: Apply over primary dressing as directed. Compression Wrap: Kerlix Roll 4.5x3.1 (in/yd) (Generic) Every Other Day/30 Days Discharge Instructions: Apply Kerlix and Coban compression as directed. Compression Wrap: Coban Self-Adherent Wrap 4x5 (in/yd) (Generic) Every Other Day/30 Days Discharge Instructions: Apply over Kerlix as directed. Laboratory naerobe culture (MICRO) - right medial foot Bacteria identified in Unspecified specimen by A LOINC Code: 417-4 Convenience Name: Anerobic culture Radiology Computed Tomography (CT) Scan - WITHOUT CONTRAST RIGHT MEDIAL FOOT ; Electronic Signature(s) Signed: 02/21/2021 4:29:54 PM By: Linton Ham MD Signed: 02/21/2021 4:48:23 PM By: [name] Entered By: [name] on 02/21/2021 11:42:18 Prescription 02/21/2021 -------------------------------------------------------------------------------- [name] MD Patient Name: Provider: [birth year] [id number] Date of Birth: NPI#[name] [id number] Sex: DEA  #: [phone] Phone #: License #: [place] Patient Address: [address], [place] [address] [place], [place] [address] [phone] Allergies Sulfa Provider's Orders Computed Tomography (CT) Scan - WITHOUT CONTRAST RIGHT MEDIAL FOOT ; Hand Signature: Date(s): Electronic Signature(s) Signed: 02/21/2021 4:29:54 PM By: Linton Ham MD Signed: 02/21/2021 4:48:23 PM By: [name] Entered By: [name] on 02/21/2021 11:42:27 -------------------------------------------------------------------------------- Problem List Details Patient Name: Date of Service: [name] Foley F. 02/21/2021 10:30 A M Medical Record Number: [id number] Patient Account Number: [id number] Date of Birth/Sex: Treating RN: 1945-07-08 (75 y.o. 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 11/01/2020 No Yes S80.211D Abrasion, right knee, subsequent encounter 11/01/2020 No Yes 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

## 2021-02-24 ENCOUNTER — Telehealth: Payer: Self-pay | Admitting: Pharmacist

## 2021-02-24 NOTE — Chronic Care Management (AMB) (Signed)
Chronic Care Management Pharmacy Assistant   Name: [name] [name]  MRN: [id number] DOB: 06-15-1945  02/24/21 APPOINTMENT REMINDER   Patients daughter [name] [name] was reminded the patient has an appointment, she reports she will advise him to have all medications, supplements and any blood glucose and blood pressure readings available for review with Jeni Salles, Pharm. D, for telephone visit on 11/7 at 9.   Care Gaps: COVID Vaccines - Overdue Eye Exam - Overdue Hepatitis C - Overdue Zoster Vaccine - Overdue Flu Vaccine - Overdue AWV - office aware to schedule CCM F/U Call - 11/22 Lab Results  Component Value Date   HGBA1C 6.0 (H) 10/10/2020   Star Rating Drug: Rosuvastatin (Crestor) 40 mg - Last filled 02/11/2021 90 DS at [facility] Losartan (Cozaar) 25 mg - Last filled 02/16/2021 90 DS at [facility]  Any gaps in medications fill history? None  Medications: Outpatient Encounter Medications as of 02/24/2021  Medication Sig Note   acetaminophen (TYLENOL) 500 MG tablet Take 1,000 mg by mouth every 6 (six) hours as needed for moderate pain or headache.    Alcohol Swabs (B-D SINGLE USE SWABS REGULAR) PADS USE TO TEST BLOOD SUGAR DAILY    amiodarone (PACERONE) 200 MG tablet TAKE 1 TABLET EVERY DAY    Ascorbic Acid (VITAMIN C PO) Take 2 tablets by mouth daily.    aspirin EC 81 MG tablet Take 81 mg by mouth daily.    Blood Glucose Calibration (TRUE METRIX LEVEL 1) Low SOLN USE TO TEST BLOOD SUGAR DAILY    Blood Glucose Monitoring Suppl (TRUE METRIX AIR GLUCOSE METER) w/Device KIT 1 Device by Does not apply route daily.    Blood Pressure Monitoring (BLOOD PRESSURE MONITOR AUTOMAT) DEVI To check BP daily    carvedilol (COREG) 3.125 MG tablet TAKE 1 TABLET (3.125 MG TOTAL) BY MOUTH 2 (TWO) TIMES DAILY WITH A MEAL. MUST KEEP PENDING APPT FOR FURTHER REFILLS    clopidogrel (PLAVIX) 75 MG tablet TAKE 1 TABLET EVERY DAY    clotrimazole-betamethasone (LOTRISONE) cream Apply 1 application  topically daily as needed. mall amount.    finasteride (PROSCAR) 5 MG tablet TAKE 1 TABLET EVERY DAY    HYDROcodone-acetaminophen (NORCO/VICODIN) 5-325 MG tablet Take 1 tablet by mouth every 4 (four) hours as needed for moderate pain. 08/18/2020: .   insulin aspart (NOVOLOG) 100 UNIT/ML injection Inject 3 Units into the skin 3 (three) times daily with meals.    insulin glargine (LANTUS SOLOSTAR) 100 UNIT/ML Solostar Pen Inject 10 Units into the skin at bedtime. (Patient taking differently: Inject 30 Units into the skin at bedtime.)    losartan (COZAAR) 25 MG tablet TAKE 1/2 TABLET TWICE DAILY    mirtazapine (REMERON) 15 MG tablet TAKE 1 TABLET (15 MG TOTAL) BY MOUTH AT BEDTIME.    Multiple Vitamin (MULTIVITAMIN WITH MINERALS) TABS tablet Take 1 tablet by mouth daily. One-A-Day    Multiple Vitamins-Minerals (ZINC PO) Take 1 tablet by mouth daily.    nystatin (MYCOSTATIN/NYSTOP) powder Apply topically 3 (three) times daily.    rosuvastatin (CRESTOR) 40 MG tablet TAKE 1 TABLET EVERY DAY    SANTYL ointment Apply 1 application topically as needed (for wound care).    tamsulosin (FLOMAX) 0.4 MG CAPS capsule TAKE 1 CAPSULE EVERY DAY    torsemide (DEMADEX) 20 MG tablet TAKE 1 TABLET EVERY OTHER DAY    TRUE METRIX BLOOD GLUCOSE TEST test strip USE  AS  INSTRUCTED    TRUEplus Lancets 30G MISC USE  TO  TEST BLOOD SUGAR EVERY DAY (Patient taking differently: USE  TO  TEST BLOOD SUGAR EVERY DAY)    Vitamin A 2400 MCG (8000 UT) CAPS Take 2,400 mcg by mouth daily.    No facility-administered encounter medications on file as of 02/24/2021.   [place] Clinical Pharmacist Assistant [phone]

## 2021-02-27 ENCOUNTER — Telehealth: Payer: Self-pay | Admitting: Pharmacist

## 2021-02-27 ENCOUNTER — Telehealth: Payer: Medicare HMO

## 2021-02-27 LAB — AEROBIC/ANAEROBIC CULTURE W GRAM STAIN (SURGICAL/DEEP WOUND): Gram Stain: NONE SEEN

## 2021-02-27 NOTE — Telephone Encounter (Signed)
  Chronic Care Management   Outreach Note  02/27/2021 Name: TIGRAN HAYNIE MRN: [id number] DOB: 01-26-1946  Referred by: [name], [name] G, MD  Patient had a phone appointment scheduled with clinical pharmacist today.  An unsuccessful telephone outreach was attempted today. The patient was referred to the pharmacist for assistance with care management and care coordination.   If possible, a message was left to return call to: [phone] or to [facility]: [place], PharmD, [place] [facility] [place]

## 2021-02-27 NOTE — Progress Notes (Deleted)
Chronic Care Management Pharmacy Note  02/27/2021 Name:  [name] [name] MRN:  [id number] DOB:  [name], 1945  Summary: ***  Recommendations/Changes made from today's visit: ***  Plan: ***   Subjective: [name] [name] is an 75 y.o. year old male who is a primary patient of [name], Malka So, MD.  The CCM team was consulted for assistance with disease management and care coordination needs.    Engaged with patient by telephone for follow up visit in response to provider referral for pharmacy case management and/or care coordination services.   Consent to Services:  The patient was given information about Chronic Care Management services, agreed to services, and gave verbal consent prior to initiation of services.  Please see initial visit note for detailed documentation.   Patient Care Team: [name], [name] G, MD as PCP - General (Family Medicine) [name], [name] G, MD (Family Medicine) Viona Gilmore, [facility] as Pharmacist (Pharmacist)  Recent office visits: 01/16/21 Randel Pigg, LPN: Patient presented for AWV.  11/09/20 [name], [name] G, MD - Patient presented for Type 2 diabetes mellitus with diabetic nephropathy, with long-term current use of insulin and other concerns. No medication changes  Recent consult visits: 02/21/21 Linton Ham, MD (wound care): Patient presented for wound care for left foot ulcer.  02/14/21 Risa Grill, PA-C (vascular & vein): Patient presented for PAD follow up. No medication changes. Follow up in 6 months.  02/07/21 Linton Ham, MD (wound care): Patient presented for wound care for left foot ulcer.  01/24/21 Linton Ham, MD (wound care): Patient presented for wound care for left foot ulcer.  01/10/21 Linton Ham, MD (wound care): Patient presented for wound care for left foot ulcer.  12/27/20 Linton Ham, MD (wound care): Patient presented for wound care for left foot ulcer.  11/15/20 Linton Ham, MD (wound care): Patient presented  for wound care for left foot ulcer.  11/08/2020 Rhyne, Hulen Shouts, PA-C (Vascular Surg) - Patient presented for PAD. No medication changes.  09-27-2020 [name], MD (Vascular Surg) - Patient presented for PAD. No medication changes.   09-21-2020 Setzer, Edman Circle, PA-C ( Vascular Surg) - Patient presented for diabetic foot ulcer on left foot and other issues. No medication changes.   09-01-2020 Truman Hayward, MD (Infectious Disease) - Patient presented for Chronic osteomyelitis of toe, left. No medication changes.     Hospital visits: 12/15/20 Patient admitted to [facility] for right left arteriogram.   Medication Reconciliation was completed by comparing discharge summary, patient's EMR and Pharmacy list, and upon discussion with patient.   Patient presented to [facility] on 10-10-2020 due to CHF and other issues. Discharge date was 10-13-2020.    New?Medications Started at [facility] Discharge:?? None   Medication Changes at Hospital Discharge: Insulin aspart 100 u/ml -inject 3 times daily with meds Carvedilol 3.125 -  two times daily Lantus 100u/ml - inject 10 units at bedtime Losartan 25 mg - take half tablet daily     Medications Discontinued at Hospital Discharge: None   Medications that remain the same after Hospital Discharge:??  -All other medications will remain the same.       Medication Reconciliation was completed by comparing discharge summary, patient's EMR and Pharmacy list, and upon discussion with patient.   Objective:  Lab Results  Component Value Date   CREATININE 1.70 (H) 12/15/2020   BUN 24 (H) 12/15/2020   GFR 36.42 (L) 11/09/2020   GFRNONAA 40 (L) 10/12/2020   GFRAA  36 (L) 06/15/2019   NA 150 (H) 12/15/2020   K 3.7 12/15/2020   CALCIUM 7.7 (L) 11/09/2020   CO2 27 11/09/2020   GLUCOSE 113 (H) 12/15/2020    Lab Results  Component Value Date/Time   HGBA1C 6.0 (H) 10/10/2020 06:35 PM   HGBA1C 6.2  (H) 05/27/2020 02:46 AM   FRUCTOSAMINE 219 11/09/2020 10:14 AM   FRUCTOSAMINE 363 (H) 04/09/2017 12:44 PM   GFR 36.42 (L) 11/09/2020 10:14 AM   GFR 31.83 (L) 10/08/2017 08:52 AM   MICROALBUR 2.9 (H) 09/10/2017 11:57 AM   MICROALBUR 9.3 (H) 04/09/2017 12:44 PM    Last diabetic Eye exam: No results found for: HMDIABEYEEXA  Last diabetic Foot exam: No results found for: HMDIABFOOTEX   Lab Results  Component Value Date   CHOL 120 03/09/2014   HDL 39 (L) 03/09/2014   LDLCALC 56 03/09/2014   LDLDIRECT 100.5 02/08/2012   TRIG 123 03/09/2014   CHOLHDL 3.1 03/09/2014    Hepatic Function Latest Ref Rng & Units 10/12/2020 10/10/2020 05/27/2020  Total Protein 6.5 - 8.1 g/dL 5.7(L) 6.8 6.7  Albumin 3.5 - 5.0 g/dL 1.8(L) 2.0(L) 2.4(L)  AST 15 - 41 U/L 19 20 56(H)  ALT 0 - 44 U/L 7 11 43  Alk Phosphatase 38 - 126 U/L 87 106 89  Total Bilirubin 0.3 - 1.2 mg/dL 0.7 0.7 0.5  Bilirubin, Direct 0.0 - 0.3 mg/dL - - -    Lab Results  Component Value Date/Time   TSH 1.755 01/30/2018 09:35 AM   TSH 2.365 07/19/2016 02:28 PM   TSH 1.245 12/30/2013 04:34 PM   TSH 0.66 07/02/2013 12:55 PM   FREET4 1.11 01/30/2018 09:35 AM    CBC Latest Ref Rng & Units 12/15/2020 12/15/2020 10/12/2020  WBC 4.0 - 10.5 K/uL - - 9.7  Hemoglobin 13.0 - 17.0 g/dL 11.2(L) 10.9(L) 9.7(L)  Hematocrit 39.0 - 52.0 % 33.0(L) 32.0(L) 32.0(L)  Platelets 150 - 400 K/uL - - 198    Lab Results  Component Value Date/Time   VD25OH 31.93 06/26/2016 10:37 AM    Clinical ASCVD: Yes  The ASCVD Risk score (Arnett DK, et al., 2019) failed to calculate for the following reasons:   The patient has a prior MI or stroke diagnosis    Depression screen [facility] 2/9 01/16/2021 12/04/2016  Decreased Interest 0 0  Down, Depressed, Hopeless 0 0  PHQ - 2 Score 0 0  Some recent data might be hidden     ***Other: (CHADS2VASc if Afib, MMRC or CAT for COPD, ACT, DEXA)  Social History   Tobacco Use  Smoking Status Never  Smokeless Tobacco Never    BP Readings from Last 3 Encounters:  02/14/21 112/63  12/15/20 (!) 132/102  11/09/20 92/60   Pulse Readings from Last 3 Encounters:  02/14/21 64  12/15/20 90  11/09/20 83   Wt Readings from Last 3 Encounters:  12/15/20 200 lb (90.7 kg)  10/10/20 196 lb 3.4 oz (89 kg)  08/16/20 202 lb (91.6 kg)   BMI Readings from Last 3 Encounters:  02/14/21 26.39 kg/m  12/15/20 26.39 kg/m  11/09/20 25.89 kg/m    Assessment/Interventions: Review of patient past medical history, allergies, medications, health status, including review of consultants reports, laboratory and other test data, was performed as part of comprehensive evaluation and provision of chronic care management services.   SDOH:  (Social Determinants of Health) assessments and interventions performed: {yes/no:20286}  SDOH Screenings   Alcohol Screen: Low Risk    Last Alcohol  Screening Score (AUDIT): 0  Depression (PHQ2-9): Low Risk    PHQ-2 Score: 0  Financial Resource Strain: Low Risk    Difficulty of Paying Living Expenses: Not hard at all  Food Insecurity: No Food Insecurity   Worried About Charity fundraiser in the Last Year: Never true   Ran Out of Food in the Last Year: Never true  Housing: Low Risk    Last Housing Risk Score: 0  Physical Activity: Inactive   Days of Exercise per Week: 0 days   Minutes of Exercise per Session: 0 min  Social Connections: Moderately Isolated   Frequency of Communication with Friends and Family: Three times a week   Frequency of Social Gatherings with Friends and Family: Three times a week   Attends Religious Services: Never   Active Member of Clubs or Organizations: No   Attends Archivist Meetings: Never   Marital Status: Married  Stress: No Stress Concern Present   Feeling of Stress : Not at all  Tobacco Use: Low Risk    Smoking Tobacco Use: Never   Smokeless Tobacco Use: Never   Passive Exposure: Not on file  Transportation Needs: No Transportation Needs    Lack of Transportation (Medical): No   Lack of Transportation (Non-Medical): No    CCM Care Plan  Allergies  Allergen Reactions   Tape Other (See Comments)    SKIN IS VERY FRAGILE- WILL TEAR AND BRUISE EASILY!!   Sulfa Antibiotics Rash    Medications Reviewed Today     Reviewed by Barbie Banner, PA-C (Physician Assistant Certified) on 76/54/65 at [place] List Status: <None>   Medication Order Taking? Sig Documenting Provider Last Dose Status Informant  acetaminophen (TYLENOL) 500 MG tablet [id number] Yes Take 1,000 mg by mouth every 6 (six) hours as needed for moderate pain or headache. [provider] Taking Active Family Member  Alcohol Swabs (B-D SINGLE USE SWABS REGULAR) PADS [id number] Yes USE TO TEST BLOOD SUGAR DAILY [name], [name] G, MD Taking Active Family Member  amiodarone (PACERONE) 200 MG tablet [id number] Yes TAKE 1 TABLET EVERY DAY [name], [name] G, MD Taking Active Family Member  Ascorbic Acid (VITAMIN C PO) [id number] Yes Take 2 tablets by mouth daily. [provider] Taking Active Family Member  aspirin EC 81 MG tablet [id number] Yes Take 81 mg by mouth daily. [provider] Taking Active Family Member  Blood Glucose Calibration (TRUE METRIX LEVEL 1) Low SOLN [id number] Yes USE TO TEST BLOOD SUGAR DAILY [name], [name] G, MD Taking Active Family Member  Blood Glucose Monitoring Suppl (TRUE METRIX AIR GLUCOSE METER) w/Device KIT [id number] Yes 1 Device by Does not apply route daily. [name], [name] G, MD Taking Active Family Member  Blood Pressure Monitoring (BLOOD PRESSURE MONITOR AUTOMAT) DEVI [id number] Yes To check BP daily [name], [name] G, MD Taking Active Family Member  carvedilol (COREG) 3.125 MG tablet [id number] Yes TAKE 1 TABLET (3.125 MG TOTAL) BY MOUTH 2 (TWO) TIMES DAILY WITH A MEAL. MUST KEEP PENDING APPT FOR FURTHER REFILLS Bensimhon, Shaune Pascal, MD Taking Active Family Member  clopidogrel (PLAVIX) 75 MG tablet [id number] Yes TAKE 1 TABLET  EVERY DAY [name], [name] G, MD Taking Active   clotrimazole-betamethasone (LOTRISONE) cream [id number] Yes Apply 1 application topically daily as needed. mall amount. [name], [name] G, MD Taking Active   finasteride (PROSCAR) 5 MG tablet [id number] Yes TAKE 1 TABLET EVERY DAY [name], [name] G, MD Taking Active Family Member  HYDROcodone-acetaminophen (NORCO/VICODIN) 5-325 MG tablet  [id number] Yes Take 1 tablet by mouth every 4 (four) hours as needed for moderate pain. [provider] Taking Active Family Member           Med Note Olivia Mackie Aug 18, 2020 11:35 AM) .  insulin aspart (NOVOLOG) 100 UNIT/ML injection [id number] Yes Inject 3 Units into the skin 3 (three) times daily with meals. Mariel Aloe, MD Taking Active Family Member  insulin glargine (LANTUS SOLOSTAR) 100 UNIT/ML Solostar Pen [id number] Yes Inject 10 Units into the skin at bedtime.  Patient taking differently: Inject 30 Units into the skin at bedtime.   Mariel Aloe, MD Taking Active   losartan (COZAAR) 25 MG tablet [id number] Yes TAKE 1/2 TABLET TWICE DAILY [name], [name] G, MD Taking Active Family Member  mirtazapine (REMERON) 15 MG tablet [id number] Yes TAKE 1 TABLET (15 MG TOTAL) BY MOUTH AT BEDTIME. [name], [name] G, MD Taking Active Family Member  Multiple Vitamin (MULTIVITAMIN WITH MINERALS) TABS tablet [id number] Yes Take 1 tablet by mouth daily. One-A-Day [provider] Taking Active Family Member  Multiple Vitamins-Minerals (ZINC PO) [id number] Yes Take 1 tablet by mouth daily. [provider] Taking Active Family Member  nystatin (MYCOSTATIN/NYSTOP) powder [id number] Yes Apply topically 3 (three) times daily. [name], [name] G, MD Taking Active   rosuvastatin (CRESTOR) 40 MG tablet [id number] Yes TAKE 1 TABLET EVERY DAY [name], [name] G, MD Taking Active   SANTYL ointment [id number] Yes Apply 1 application topically as needed (for wound care). [provider] Taking Active    tamsulosin (FLOMAX) 0.4 MG CAPS capsule [id number] Yes TAKE 1 CAPSULE EVERY DAY [name], [name] G, MD Taking Active Family Member  torsemide [facility]) 20 MG tablet [id number] Yes TAKE 1 TABLET EVERY OTHER DAY [name], [name] G, MD Taking Active Family Member  TRUE METRIX BLOOD GLUCOSE TEST test strip [id number] Yes USE  AS  INSTRUCTED [name], [name] G, MD Taking Active Family Member  TRUEplus Lancets 30G [place] [id number] Yes USE  TO  TEST BLOOD SUGAR EVERY DAY  Patient taking differently: USE  TO  TEST BLOOD SUGAR EVERY DAY   [name], [name] G, MD Taking Active   Vitamin A 2400 MCG (8000 UT) CAPS [id number] Yes Take 2,400 mcg by mouth daily. [provider] Taking Active Family Member            Patient Active Problem List   Diagnosis Date Noted   S/P AKA (above knee amputation) unilateral, left ([place]) 11/09/2020   Chronic osteomyelitis ([place])    Pyuria    Chronic indwelling Foley catheter    Pressure injury of skin 10/11/2020   Non-healing wound of lower extremity 10/10/2020   Non-healing wound of left lower extremity 10/10/2020   PAD (peripheral artery disease) ([place]) 09/27/2020   Chronic osteomyelitis of toe, left ([place]) 09/01/2020   MRSA infection 09/01/2020   Enterococcus faecalis infection 09/01/2020   Polymicrobial bacterial infection 09/01/2020   Influenza A 98/92/1194   Acute metabolic encephalopathy 17/40/8144   Constipation 10/30/2016   Neurogenic bladder 10/30/2016   Sacral decubitus ulcer, stage II ([place]) 10/30/2016   Unstable gait 03/27/2016   Atrial flutter ([place]) 03/07/2016   Coronary artery disease involving coronary bypass graft of native heart without angina pectoris    Chronic combined systolic and diastolic congestive heart failure, NYHA class 3 (HCC)    Hyperkalemia 03/06/2016   S/P hip hemiarthroplasty 01/03/2016   Pressure ulcer 12/17/2015   Fracture, hip ([place]) 12/16/2015   Depression, major, single episode, mild (  [place]) 12/13/2015   Left foot drop 11/21/2015    Diabetic polyneuropathy associated with type 2 diabetes mellitus ([place]) 11/21/2015   Acute kidney injury superimposed on CKD ([place])    Diabetic ulcer of left foot associated with type 2 diabetes mellitus ([place])    Type 2 diabetes mellitus with diabetic nephropathy, with long-term current use of insulin (HCC)    Type 2 diabetes mellitus with left diabetic foot ulcer (HCC)    Klebsiella infection    Aspiration pneumonia of right lower lobe due to vomit (HCC)    Chronic combined systolic and diastolic congestive heart failure (HCC)    HLD (hyperlipidemia)    Uncontrolled type 2 diabetes mellitus with complication    Ventricular tachycardia    NSTEMI (non-ST elevated myocardial infarction) ([place])    PAH (pulmonary artery hypertension) (HCC)    CKD (chronic kidney disease) stage 3, GFR 30-59 ml/min ([place]) 08/16/2015   UTI (urinary tract infection) 04/10/2015   Syncope 04/10/2015   Fatigue, possible anginal equivalent  12/31/2013   CAD, multiple vessel, RCA 100% mid occl.; LAD 99% stenosisprox.  mid of 50-60%; LCX OM-1 90%, OM-2 99%,AV groove 80%  12/10/2013   MVP (mitral valve prolapse) 12/01/2013   Proteinuria 02/25/2013   Type 2 diabetes mellitus with diabetic nephropathy ([place]) 02/25/2013   Automatic implantable cardioverter-defibrillator in situ 05/06/2012   Polyneuropathy due to secondary diabetes mellitus ([place]) 04/21/2012   Bladder outlet obstruction 12/25/2011   Dysarthria 09/06/2011   Chronic combined systolic and diastolic heart failure ([place]) 10/11/2009   GOUT 10/07/2007   Type II diabetes mellitus with neurological manifestations, uncontrolled 08/12/2007   Ischemic cardiomyopathy 04/11/2007   Obstructive sleep apnea 02/27/2007   Hypertension with congestive heart failure and renal failure ([place]) 02/27/2007   Old Anterior MI (myocardial infarction) 02/27/2007    Immunization History  Administered Date(s) Administered   Influenza Split 01/01/2012   Influenza Whole 04/24/2005,  01/29/2008, 01/13/2009   Influenza, High Dose Seasonal PF 04/09/2017, 02/11/2018   Influenza,inj,Quad PF,6+ Mos 03/22/2014   Pneumococcal Conjugate-13 02/11/2018   Pneumococcal Polysaccharide-23 04/11/2007, 12/19/2015   Td 04/26/2003, 06/15/2008    Conditions to be addressed/monitored:  Hypertension, Hyperlipidemia, Diabetes, Heart Failure, Coronary Artery Disease, and Overactive Bladder  Conditions addressed this visit: ***  There are no care plans that you recently modified to display for this patient.   Current Barriers:  {pharmacybarriers:24917}  Pharmacist Clinical Goal(s):  Patient will {PHARMACYGOALCHOICES:24921} through collaboration with PharmD and provider.   Interventions: 1:1 collaboration with [name], [name] G, MD regarding development and update of comprehensive plan of care as evidenced by provider attestation and co-signature Inter-disciplinary care team collaboration (see longitudinal plan of care) Comprehensive medication review performed; medication list updated in electronic medical record  BP Readings from Last 3 Encounters:  02/14/21 112/63  12/15/20 (!) 132/102  11/09/20 92/60    Hypertension (BP goal <140/90) -{US controlled/uncontrolled:25276} -Current treatment: Carvedilol (Coreg) 3.125, 1 tablet twice daily with a meal Losartan 79m, 0.5 (one-half) tablet twice daily -Medications previously tried: ***  -Current home readings: *** -Current dietary habits: *** -Current exercise habits: *** -{ACTIONS;DENIES/REPORTS:21021675::"Denies"} hypotensive/hypertensive symptoms -Educated on {CCM BP Counseling:25124} -Counseled to monitor BP at home ***, document, and provide log at future appointments -{CCMPHARMDINTERVENTION:25122}  Lab Results  Component Value Date   CHOL 120 03/09/2014   HDL 39 (L) 03/09/2014   LDLCALC 56 03/09/2014   LDLDIRECT 100.5 02/08/2012   TRIG 123 03/09/2014   CHOLHDL 3.1 03/09/2014    Hyperlipidemia: (LDL goal <  70) -Controlled -Current  treatment: Rosuvastatin 29m, 1 tablet once daily -Medications previously tried: ***  -Current dietary patterns: *** -Current exercise habits: *** -Educated on {CCM HLD Counseling:25126} -{CCMPHARMDINTERVENTION:25122}  CAD/PAD (Goal: ***) -{US controlled/uncontrolled:25276} -Current treatment  Rosuvastatin 416m 1 tablet once daily  Aspirin 8120m tablet once daily Clopidogrel 68m65m tablet once daily -Medications previously tried: ***  -{CCMPHARMDINTERVENTION:25122}  Ventricular tachycardia (Goal: ***) -{US controlled/uncontrolled:25276} -Current treatment  Amiodarone 200mg15mtablet once daily -Medications previously tried: ***  -{CCMPHARMDINTERVENTION:25122}    Lab Results  Component Value Date   HGBA1C 6.0 (H) 10/10/2020    Diabetes (A1c goal <8%) -Controlled -Current medications: Insulin aspart (Novolog), inject 10 units three times daily with meals (add 1 unit for sugar: 150-200, add 3 units for sugar: 201-250, add 5 units for sugars: 251-300, add 7 units for sugars - 3 units? *** Insulin glargine (Lantus Solostar), inject 30 units at bedtime  -Medications previously tried: none  -Current home glucose readings fasting glucose: *** post prandial glucose: *** -{ACTIONS;DENIES/REPORTS:21021675::"Denies"} hypoglycemic/hyperglycemic symptoms -Current meal patterns:  breakfast: ***  lunch: ***  dinner: *** snacks: *** drinks: *** -Current exercise: *** -Educated on {CCM DM COUNSELING:25123} -Counseled to check feet daily and get yearly eye exams -{CCMPHARMDINTERVENTION:25122} CGM?  Heart Failure (Goal: manage symptoms and prevent exacerbations) -{US controlled/uncontrolled:25276} -Last ejection fraction: 20-25% (Date: 08/18/20) -HF type: Combined Systolic and Diastolic -NYHA Class: {CHL HP Upstream Pharm NYHA Class:[phone]} -AHA HF Stage: {CHL HP Upstream Pharm AHA HF Stage:[phone]} -Current treatment: Carvedilol (Coreg)  3.125, 1 tablet twice daily with a meal Losartan 25mg,60m (one-half) tablet twice daily Torsemide 20mg, 30mblet every other day -Medications previously tried: ***  -Current home BP/HR readings: *** -Current dietary habits: *** -Current exercise habits: *** -Educated on {CCM HF Counseling:25125} -{CCMPHARMDINTERVENTION:25122}  Neurogenic bladder (Goal: ***) -{US controlled/uncontrolled:25276} -Current treatment  Tamsulosin 0.4mg, 1 24msule once daily Finasteride 5mg, 1 t57met once daily -Medications previously tried: ***  -{CCMPHARMDINTERVENTION:25122}  *** (Goal: ***) -{US controlled/uncontrolled:25276} -Current treatment  Mirtazapine 15mg, 1 t72mt at bedtime -Medications previously tried: ***  -{CCMPHARMDINTERVENTION:25122}  Hydrocodone?  Health Maintenance -Vaccine gaps: COVID vaccine, shingrix, influenza -Current therapy:  Ascorbic acid 1000mg, 1 ta109m twice daily Clortrimazole- betamethasone (Lotrisone) cream, apply once daily as needed Magnesium 400mg, 1 tab41mevery other day Multivitamin- iron-minerals-folic acid, 1 tablet once daily Nystatin powder, apply three times daiiy Can use corn starch.  Probiotic, 1 capsule once daily Vitamin A 2400mcg  (10,056mnits, 1 capsule once daily Zinc sulfate 1000mg, 1 capsu105mnce daily -Educated on {ccm supplement counseling:25128} -{CCM Patient satisfied:25129} -{CCMPHARMDINTERVENTION:25122}  Patient Goals/Self-Care Activities Patient will:  - {pharmacypatientgoals:24919}  Follow Up Plan: {CM FOLLOW UP PLAN:22241}   TSVX:79390}Assistance: {MEDASSISTANCEINFO:25044}  Compliance/Adherence/Medication fill history: Care Gaps: ***  Star-Rating Drugs: ***  Patient's [facility] is:  [facility] [id number] [phone]  [facility] [address] [id number] [phone]  [phone]? {Yes or If no, why not?:20788} Pt endorses ***% compliance  We discussed: {Pharmacy options:24294} Patient decided to: {US Pharmacy Plan:23885}  [facility] Decision:  {FOLLOWUP:24991}  Plan: {CM FOLLOW UP PLAN:25073}  MBTDH:74163}orJeni SallesP ClinicalLos Robles Surgicenter LLCmacist {MP practice sites:26434} [phone]

## 2021-03-06 ENCOUNTER — Encounter (HOSPITAL_BASED_OUTPATIENT_CLINIC_OR_DEPARTMENT_OTHER): Payer: Medicare HMO | Admitting: Internal Medicine

## 2021-03-06 ENCOUNTER — Other Ambulatory Visit: Payer: Self-pay

## 2021-03-06 DIAGNOSIS — L97518 Non-pressure chronic ulcer of other part of right foot with other specified severity: Secondary | ICD-10-CM

## 2021-03-06 DIAGNOSIS — E11621 Type 2 diabetes mellitus with foot ulcer: Secondary | ICD-10-CM | POA: Diagnosis not present

## 2021-03-06 DIAGNOSIS — I130 Hypertensive heart and chronic kidney disease with heart failure and stage 1 through stage 4 chronic kidney disease, or unspecified chronic kidney disease: Secondary | ICD-10-CM | POA: Diagnosis not present

## 2021-03-06 DIAGNOSIS — I5042 Chronic combined systolic (congestive) and diastolic (congestive) heart failure: Secondary | ICD-10-CM | POA: Diagnosis not present

## 2021-03-06 DIAGNOSIS — E1122 Type 2 diabetes mellitus with diabetic chronic kidney disease: Secondary | ICD-10-CM | POA: Diagnosis not present

## 2021-03-06 DIAGNOSIS — L97218 Non-pressure chronic ulcer of right calf with other specified severity: Secondary | ICD-10-CM | POA: Diagnosis not present

## 2021-03-06 DIAGNOSIS — S80211D Abrasion, right knee, subsequent encounter: Secondary | ICD-10-CM

## 2021-03-06 DIAGNOSIS — E1140 Type 2 diabetes mellitus with diabetic neuropathy, unspecified: Secondary | ICD-10-CM | POA: Diagnosis not present

## 2021-03-06 DIAGNOSIS — L97418 Non-pressure chronic ulcer of right heel and midfoot with other specified severity: Secondary | ICD-10-CM | POA: Diagnosis not present

## 2021-03-06 NOTE — Progress Notes (Signed)
RANA, ADORNO ([id number]) Visit Report for 03/06/2021 Arrival Information Details Patient Name: Date of Service: [name] [name] [facility] F. 03/06/2021 8:00 A M Medical Record Number: [id number] Patient Account Number: [id number] Date of Birth/Sex: Treating RN: 1945/05/17 (75 y.o. [name] [name], Vaughan Basta Primary Care Sherrol Vicars: [name], [name] Other Clinician: Referring Rolonda Pontarelli: Treating Liba Hulsey/Extender: Hoffman, Jessica [name], [name] Weeks in Treatment: 254 Visit Information History Since Last Visit Added or deleted any medications: No Patient Arrived: Wheel Chair Any new allergies or adverse reactions: No Arrival Time: 08:16 Had a fall or experienced change in No Accompanied By: family activities of daily living that may affect Transfer Assistance: Manual risk of falls: Patient Identification Verified: Yes Signs or symptoms of abuse/neglect since last visito No Secondary Verification Process Completed: Yes Hospitalized since last visit: No Patient Requires Transmission-Based Precautions: No Implantable device outside of the clinic excluding No Patient Has Alerts: Yes cellular tissue based products placed in the center Patient Alerts: Patient on Blood Thinner since last visit: left ABI 1.0; rt ABI 1.09 Has Dressing in Place as Prescribed: Yes Has Compression in Place as Prescribed: Yes Pain Present Now: No Electronic Signature(s) Signed: 03/06/2021 4:40:13 PM By: Baruch Gouty RN, BSN Entered By: Baruch Gouty on 03/06/2021 08:20:59 -------------------------------------------------------------------------------- Clinic Level of Care Assessment Details Patient Name: Date of Service: [name] [name] F. 03/06/2021 8:00 A M Medical Record Number: [id number] Patient Account Number: [id number] Date of Birth/Sex: Treating RN: [birth year] (75 y.o. [name] Primary Care Adelis Docter: [name], [name] Other Clinician: Referring Oryon Gary: Treating Zyra Parrillo/Extender: Hoffman,  Jessica [name], [name] Weeks in Treatment: 254 Clinic Level of Care Assessment Items TOOL 4 Quantity Score []  - 0 Use when only an EandM is performed on FOLLOW-UP visit ASSESSMENTS - Nursing Assessment / Reassessment X- 1 10 Reassessment of Co-morbidities (includes updates in patient status) X- 1 5 Reassessment of Adherence to Treatment Plan ASSESSMENTS - Wound and Skin A ssessment / Reassessment []  - 0 Simple Wound Assessment / Reassessment - one wound X- 6 5 Complex Wound Assessment / Reassessment - multiple wounds []  - 0 Dermatologic / Skin Assessment (not related to wound area) ASSESSMENTS - Focused Assessment X- 1 5 Circumferential Edema Measurements - multi extremities []  - 0 Nutritional Assessment / Counseling / Intervention X- 1 5 Lower Extremity Assessment (monofilament, tuning fork, pulses) []  - 0 Peripheral Arterial Disease Assessment (using hand held doppler) ASSESSMENTS - Ostomy and/or Continence Assessment and Care []  - 0 Incontinence Assessment and Management []  - 0 Ostomy Care Assessment and Management (repouching, etc.) PROCESS - Coordination of Care X - Simple Patient / Family Education for ongoing care 1 15 []  - 0 Complex (extensive) Patient / Family Education for ongoing care X- 1 10 Staff obtains Programmer, systems, Records, T Results / Process Orders est []  - 0 Staff telephones HHA, Nursing Homes / Clarify orders / etc []  - 0 Routine Transfer to another Facility (non-emergent condition) []  - 0 Routine Hospital Admission (non-emergent condition) []  - 0 New Admissions / Biomedical engineer / Ordering NPWT Apligraf, etc. , []  - 0 Emergency Hospital Admission (emergent condition) X- 1 10 Simple Discharge Coordination []  - 0 Complex (extensive) Discharge Coordination PROCESS - Special Needs []  - 0 Pediatric / Minor Patient Management []  - 0 Isolation Patient Management []  - 0 Hearing / Language / Visual special needs []  - 0 Assessment of  Community assistance (transportation, D/C planning, etc.) []  - 0 Additional assistance / Altered mentation []  - 0 Support Surface(s) Assessment (bed, cushion, seat, etc.) INTERVENTIONS -  Flat and Intact N/A N/A Wound Margin: Large (67-100%) N/A N/A Granulation A mount: Red, Hyper-granulation N/A N/A Granulation Quality: None Present (0%) N/A N/A Necrotic Amount: Fat Layer (Subcutaneous Tissue): Yes N/A N/A Exposed Structures: Fascia: No Tendon: No Muscle: No Joint: No Bone: No N/A N/A N/A Epithelialization: Treatment Notes Electronic Signature(s) Signed: 03/06/2021 10:02:51 AM By: Kalman Shan DO Signed: 03/06/2021 4:35:28 PM By: Lorrin Jackson Entered By: Kalman Shan on 03/06/2021 09:53:44 -------------------------------------------------------------------------------- Multi-Disciplinary Care Plan Details Patient Name: Date of Service: [name] [name] F. 03/06/2021 8:00 A M Medical Record Number: [id number] Patient Account Number: [id number] Date of Birth/Sex: Treating RN: 07-15-1945 (75 y.o. [name] Primary Care Azalynn Maxim: [name], [name] Other Clinician: Referring Fong Mccarry: Treating Kasee Hantz/Extender: Hoffman, Jessica [name], [name] Weeks in Treatment: [place] reviewed with physician Active Inactive Venous Leg Ulcer Nursing  Diagnoses: Knowledge deficit related to disease process and management Potential for venous Insuffiency (use before diagnosis confirmed) Goals: Patient will maintain optimal edema control Date Initiated: 03/06/2021 Target Resolution Date: 04/03/2021 Goal Status: Active Interventions: Assess peripheral edema status every visit. Compression as ordered Treatment Activities: Therapeutic compression applied : 03/06/2021 Notes: Wound/Skin Impairment Nursing Diagnoses: Impaired tissue integrity Goals: Patient/caregiver will verbalize understanding of skin care regimen Date Initiated: 11/19/2017 Target Resolution Date: 03/24/2021 Goal Status: Active Ulcer/skin breakdown will have a volume reduction of 30% by week 4 Date Initiated: 11/19/2017 Date Inactivated: 12/02/2017 Target Resolution Date: 12/22/2017 Goal Status: Unmet Unmet Reason: multipl comorbidities Interventions: Assess patient/caregiver ability to obtain necessary supplies Assess ulceration(s) every visit Provide education on ulcer and skin care Notes: Electronic Signature(s) Signed: 03/06/2021 4:40:13 PM By: Baruch Gouty RN, BSN Entered By: Baruch Gouty on 03/06/2021 09:01:21 -------------------------------------------------------------------------------- Pain Assessment Details Patient Name: Date of Service: [name] [name] F. 03/06/2021 8:00 A M Medical Record Number: [id number] Patient Account Number: [id number] Date of Birth/Sex: Treating RN: [birth year] (75 y.o. [name] Primary Care Willi [place]: Other Clinician: [name], [name] Referring Anjalee Cope: Treating Jazzie Trampe/Extender: Hoffman, Jessica [name], [name] Weeks in Treatment: 254 Active Problems Location of Pain Severity and Description of Pain Patient Has Paino No Site Locations Rate the pain. Current Pain Level: 0 Pain Management and Medication Current Pain Management: Electronic Signature(s) Signed: 03/06/2021 4:40:13 PM By: Baruch Gouty  RN, BSN Entered By: Baruch Gouty on 03/06/2021 08:23:40 -------------------------------------------------------------------------------- Patient/Caregiver Education Details Patient Name: Date of Service: [name] 11/14/2022andnbsp8:00 A M Medical Record Number: [id number] Patient Account Number: [id number] Date of Birth/Gender: Treating RN: [birth year] (75 y.o. [name] Primary Care Physician: [name], [name] Other Clinician: Referring Physician: Treating Physician/Extender: Hoffman, Jessica [name], [name] Weeks in Treatment: 254 Education Assessment Education Provided To: Patient Education Topics Provided Venous: Methods: Explain/Verbal Responses: Reinforcements needed, State content correctly Wound/Skin Impairment: Methods: Explain/Verbal Responses: Reinforcements needed, State content correctly Electronic Signature(s) Signed: 03/06/2021 4:40:13 PM By: Baruch Gouty RN, BSN Entered By: Baruch Gouty on 03/06/2021 09:02:19 -------------------------------------------------------------------------------- Wound Assessment Details Patient Name: Date of Service: [name] [name] F. 03/06/2021 8:00 A M Medical Record Number: [id number] Patient Account Number: [id number] Date of Birth/Sex: Treating RN: [birth year] (75 y.o. [name] Primary Care [place]: [name], [name] Other Clinician: Referring Felina Tello: Treating Cristina Mattern/Extender: Hoffman, Jessica [name], [name] Weeks in Treatment: 254 Wound Status Wound Number: 25 Primary Venous Leg Ulcer Etiology: Wound Location: Right, Lateral Lower Leg Wound Open Wounding Event: Gradually Appeared Status: Date Acquired: 07/06/2019 Comorbid Anemia, Sleep Apnea, Arrhythmia, Congestive Heart Failure, Weeks Of Treatment: 84 History: Coronary Artery Disease, Hypertension, Myocardial Infarction, Clustered Wound:  Exposed Structure Granulation Quality: Red Fascia Exposed: No Necrotic Amount: None Present (0%) Fat Layer (Subcutaneous Tissue) Exposed: Yes Tendon Exposed: No Muscle Exposed: No Joint Exposed: No Bone Exposed: No Electronic Signature(s) Signed: 03/06/2021 4:40:13 PM By: Baruch Gouty RN, BSN Entered By: Baruch Gouty on 03/06/2021 08:59:37 -------------------------------------------------------------------------------- Wound Assessment  Details Patient Name: Date of Service: [name] [name] F. 03/06/2021 8:00 A M Medical Record Number: [id number] Patient Account Number: [id number] Date of Birth/Sex: Treating RN: 06-22-1945 (75 y.o. [name] Primary Care Rhyder Koegel: [name], [name] Other Clinician: Referring Cinthya Bors: Treating Iyad Deroo/Extender: Hoffman, Jessica [name], [name] Weeks in Treatment: 254 Wound Status Wound Number: 43 Primary Abrasion Etiology: Wound Location: Right Knee Secondary Diabetic Wound/Ulcer of the Lower Extremity Wounding Event: Trauma Etiology: Date Acquired: 02/21/2021 Wound Open Weeks Of Treatment: 0 Status: Clustered Wound: No Comorbid Anemia, Sleep Apnea, Arrhythmia, Congestive Heart Failure, History: Coronary Artery Disease, Hypertension, Myocardial Infarction, Type II Diabetes, Gout, Neuropathy Photos Wound Measurements Length: (cm) 1.5 Width: (cm) 3.2 Depth: (cm) 0.1 Area: (cm) 3.77 Volume: (cm) 0.377 % Reduction in Area: 0% % Reduction in Volume: 0% Wound Description Classification: Full Thickness Without Exposed Support Structures Wound Margin: Flat and Intact Exudate Amount: Medium Exudate Type: Serosanguineous Exudate Color: red, brown Foul Odor After Cleansing: No Slough/Fibrino No Wound Bed Granulation Amount: Large (67-100%) Exposed Structure Granulation Quality: Red, Hyper-granulation Fascia Exposed: No Necrotic Amount: None Present (0%) Fat Layer (Subcutaneous Tissue) Exposed: Yes Tendon Exposed: No Muscle Exposed: No Joint Exposed: No Bone Exposed: No Electronic Signature(s) Signed: 03/06/2021 4:40:13 PM By: Baruch Gouty RN, BSN Entered By: Baruch Gouty on 03/06/2021 08:52:25 -------------------------------------------------------------------------------- Vitals Details Patient Name: Date of Service: [name] [name] F. 03/06/2021 8:00 A M Medical Record Number: [id number] Patient Account Number: [id number] Date of  Birth/Sex: Treating RN: [birth year] (75 y.o. [name] Primary Care Teila Skalsky: [name], [name] Other Clinician: Referring Josanna Hefel: Treating Deantre Bourdon/Extender: Hoffman, Jessica [name], [name] Weeks in Treatment: 254 Vital Signs Time Taken: 08:21 Temperature (F): 98.6 Height (in): 74 Pulse (bpm): 81 Source: Stated Respiratory Rate (breaths/min): 18 Weight (lbs): 150 Blood Pressure (mmHg): 144/76 Source: Stated Capillary Blood Glucose (mg/dl): 122 Body Mass Index (BMI): 19.3 Reference Range: 80 - 120 mg / dl Notes Blood sugar stated by patient. Electronic Signature(s) Signed: 03/06/2021 4:40:13 PM By: Baruch Gouty RN, BSN Entered By: Baruch Gouty on 03/06/2021 08:23:01  Other Clinician: Referring Berry Gallacher: Treating Kyera Felan/Extender: Hoffman, Jessica [name], [name] Weeks in Treatment: 254 Wound Status Wound Number: 39 Primary Pressure Ulcer Etiology: Wound Location: Right Calcaneus Secondary Arterial Insufficiency Ulcer Wounding Event: Pressure Injury Etiology: Date Acquired: 11/15/2020 Wound Open Weeks Of Treatment: 15 Status: Clustered Wound: No Comorbid Anemia, Sleep Apnea, Arrhythmia, Congestive Heart Failure, History: Coronary Artery Disease, Hypertension, Myocardial Infarction, Type II Diabetes, Gout, Neuropathy Photos Wound Measurements Length: (cm) 4.6 Width: (cm) 2.1 Depth: (cm) 0.4 Area: (cm) 7.587 Volume: (cm) 3.035 % Reduction in Area: -198.1% % Reduction in Volume: -1094.9% Epithelialization: None Tunneling: No Undermining: No Wound Description Classification: Category/Stage III Wound Margin: Distinct, outline attached Exudate Amount: Medium Exudate Type: Serosanguineous Exudate Color: red, brown Foul Odor After Cleansing: No Slough/Fibrino Yes Wound Bed Granulation Amount: Small (1-33%) Exposed Structure Granulation Quality: Pink, Pale Fascia Exposed: No Necrotic Amount: Large (67-100%) Fat Layer (Subcutaneous Tissue) Exposed: Yes Necrotic Quality:  Adherent Slough Tendon Exposed: No Muscle Exposed: No Joint Exposed: No Bone Exposed: No Electronic Signature(s) Signed: 03/06/2021 4:40:13 PM By: Baruch Gouty RN, BSN Entered By: Baruch Gouty on 03/06/2021 08:57:50 -------------------------------------------------------------------------------- Wound Assessment Details Patient Name: Date of Service: [name] [name] F. 03/06/2021 8:00 A M Medical Record Number: [id number] Patient Account Number: [id number] Date of Birth/Sex: Treating RN: [birth year] (75 y.o. [name] Primary Care Alexis Mizuno: [name], [name] Other Clinician: Referring Leon Montoya: Treating Anwita Mencer/Extender: Hoffman, Jessica [name], [name] Weeks in Treatment: 254 Wound Status Wound Number: 41 Primary Venous Leg Ulcer Etiology: Wound Location: Right, Distal, Anterior Lower Leg Wound Open Wounding Event: Gradually Appeared Status: Date Acquired: 01/24/2021 Comorbid Anemia, Sleep Apnea, Arrhythmia, Congestive Heart Failure, Weeks Of Treatment: 5 History: Coronary Artery Disease, Hypertension, Myocardial Infarction, Clustered Wound: No Type II Diabetes, Gout, Neuropathy Photos Wound Measurements Length: (cm) Width: (cm) Depth: (cm) Area: (cm) Volume: (cm) 0 % Reduction in Area: 100% 0 % Reduction in Volume: 100% 0 Epithelialization: Large (67-100%) 0 Tunneling: No 0 Undermining: No Wound Description Classification: Full Thickness Without Exposed Support Structures Wound Margin: Flat and Intact Exudate Amount: Small Exudate Type: Serous Exudate Color: amber Foul Odor After Cleansing: No Slough/Fibrino No Wound Bed Granulation Amount: None Present (0%) Exposed Structure Necrotic Amount: None Present (0%) Fascia Exposed: No Fat Layer (Subcutaneous Tissue) Exposed: No Tendon Exposed: No Muscle Exposed: No Joint Exposed: No Bone Exposed: No Electronic Signature(s) Signed: 03/06/2021 4:40:13 PM By: Baruch Gouty RN, BSN Entered By:  Baruch Gouty on 03/06/2021 09:00:53 -------------------------------------------------------------------------------- Wound Assessment Details Patient Name: Date of Service: [name] [name] F. 03/06/2021 8:00 A M Medical Record Number: [id number] Patient Account Number: [id number] Date of Birth/Sex: Treating RN: 17-Mar-1946 (75 y.o. [name] Primary Care Kaliyah Gladman: [name], [name] Other Clinician: Referring Aaro Meyers: Treating Marquavis Hannen/Extender: Hoffman, Jessica [name], [name] Weeks in Treatment: 254 Wound Status Wound Number: 42 Primary Venous Leg Ulcer Etiology: Wound Location: Right, Proximal, Anterior Lower Leg Wound Open Wounding Event: Gradually Appeared Status: Date Acquired: 01/24/2021 Comorbid Anemia, Sleep Apnea, Arrhythmia, Congestive Heart Failure, Weeks Of Treatment: 5 History: Coronary Artery Disease, Hypertension, Myocardial Infarction, Clustered Wound: No Type II Diabetes, Gout, Neuropathy Photos Wound Measurements Length: (cm) 0.4 Width: (cm) 0.4 Depth: (cm) 0.1 Area: (cm) 0.126 Volume: (cm) 0.013 % Reduction in Area: 88.1% % Reduction in Volume: 87.7% Epithelialization: Small (1-33%) Tunneling: No Undermining: No Wound Description Classification: Full Thickness Without Exposed Support Structures Wound Margin: Flat and Intact Exudate Amount: Small Exudate Type: Serous Exudate Color: amber Foul Odor After Cleansing: No Slough/Fibrino No Wound Bed Granulation Amount: Large (67-100%)  Exposed Structure Granulation Quality: Red Fascia Exposed: No Necrotic Amount: None Present (0%) Fat Layer (Subcutaneous Tissue) Exposed: Yes Tendon Exposed: No Muscle Exposed: No Joint Exposed: No Bone Exposed: No Electronic Signature(s) Signed: 03/06/2021 4:40:13 PM By: Baruch Gouty RN, BSN Entered By: Baruch Gouty on 03/06/2021 08:59:37 -------------------------------------------------------------------------------- Wound Assessment  Details Patient Name: Date of Service: [name] [name] F. 03/06/2021 8:00 A M Medical Record Number: [id number] Patient Account Number: [id number] Date of Birth/Sex: Treating RN: 06-22-1945 (75 y.o. [name] Primary Care Rhyder Koegel: [name], [name] Other Clinician: Referring Cinthya Bors: Treating Iyad Deroo/Extender: Hoffman, Jessica [name], [name] Weeks in Treatment: 254 Wound Status Wound Number: 43 Primary Abrasion Etiology: Wound Location: Right Knee Secondary Diabetic Wound/Ulcer of the Lower Extremity Wounding Event: Trauma Etiology: Date Acquired: 02/21/2021 Wound Open Weeks Of Treatment: 0 Status: Clustered Wound: No Comorbid Anemia, Sleep Apnea, Arrhythmia, Congestive Heart Failure, History: Coronary Artery Disease, Hypertension, Myocardial Infarction, Type II Diabetes, Gout, Neuropathy Photos Wound Measurements Length: (cm) 1.5 Width: (cm) 3.2 Depth: (cm) 0.1 Area: (cm) 3.77 Volume: (cm) 0.377 % Reduction in Area: 0% % Reduction in Volume: 0% Wound Description Classification: Full Thickness Without Exposed Support Structures Wound Margin: Flat and Intact Exudate Amount: Medium Exudate Type: Serosanguineous Exudate Color: red, brown Foul Odor After Cleansing: No Slough/Fibrino No Wound Bed Granulation Amount: Large (67-100%) Exposed Structure Granulation Quality: Red, Hyper-granulation Fascia Exposed: No Necrotic Amount: None Present (0%) Fat Layer (Subcutaneous Tissue) Exposed: Yes Tendon Exposed: No Muscle Exposed: No Joint Exposed: No Bone Exposed: No Electronic Signature(s) Signed: 03/06/2021 4:40:13 PM By: Baruch Gouty RN, BSN Entered By: Baruch Gouty on 03/06/2021 08:52:25 -------------------------------------------------------------------------------- Vitals Details Patient Name: Date of Service: [name] [name] F. 03/06/2021 8:00 A M Medical Record Number: [id number] Patient Account Number: [id number] Date of  Birth/Sex: Treating RN: [birth year] (75 y.o. [name] Primary Care Teila Skalsky: [name], [name] Other Clinician: Referring Josanna Hefel: Treating Deantre Bourdon/Extender: Hoffman, Jessica [name], [name] Weeks in Treatment: 254 Vital Signs Time Taken: 08:21 Temperature (F): 98.6 Height (in): 74 Pulse (bpm): 81 Source: Stated Respiratory Rate (breaths/min): 18 Weight (lbs): 150 Blood Pressure (mmHg): 144/76 Source: Stated Capillary Blood Glucose (mg/dl): 122 Body Mass Index (BMI): 19.3 Reference Range: 80 - 120 mg / dl Notes Blood sugar stated by patient. Electronic Signature(s) Signed: 03/06/2021 4:40:13 PM By: Baruch Gouty RN, BSN Entered By: Baruch Gouty on 03/06/2021 08:23:01  RANA, ADORNO ([id number]) Visit Report for 03/06/2021 Arrival Information Details Patient Name: Date of Service: [name] [name] [facility] F. 03/06/2021 8:00 A M Medical Record Number: [id number] Patient Account Number: [id number] Date of Birth/Sex: Treating RN: 1945/05/17 (75 y.o. [name] [name], Vaughan Basta Primary Care Sherrol Vicars: [name], [name] Other Clinician: Referring Rolonda Pontarelli: Treating Liba Hulsey/Extender: Hoffman, Jessica [name], [name] Weeks in Treatment: 254 Visit Information History Since Last Visit Added or deleted any medications: No Patient Arrived: Wheel Chair Any new allergies or adverse reactions: No Arrival Time: 08:16 Had a fall or experienced change in No Accompanied By: family activities of daily living that may affect Transfer Assistance: Manual risk of falls: Patient Identification Verified: Yes Signs or symptoms of abuse/neglect since last visito No Secondary Verification Process Completed: Yes Hospitalized since last visit: No Patient Requires Transmission-Based Precautions: No Implantable device outside of the clinic excluding No Patient Has Alerts: Yes cellular tissue based products placed in the center Patient Alerts: Patient on Blood Thinner since last visit: left ABI 1.0; rt ABI 1.09 Has Dressing in Place as Prescribed: Yes Has Compression in Place as Prescribed: Yes Pain Present Now: No Electronic Signature(s) Signed: 03/06/2021 4:40:13 PM By: Baruch Gouty RN, BSN Entered By: Baruch Gouty on 03/06/2021 08:20:59 -------------------------------------------------------------------------------- Clinic Level of Care Assessment Details Patient Name: Date of Service: [name] [name] F. 03/06/2021 8:00 A M Medical Record Number: [id number] Patient Account Number: [id number] Date of Birth/Sex: Treating RN: [birth year] (75 y.o. [name] Primary Care Adelis Docter: [name], [name] Other Clinician: Referring Oryon Gary: Treating Zyra Parrillo/Extender: Hoffman,  Jessica [name], [name] Weeks in Treatment: 254 Clinic Level of Care Assessment Items TOOL 4 Quantity Score []  - 0 Use when only an EandM is performed on FOLLOW-UP visit ASSESSMENTS - Nursing Assessment / Reassessment X- 1 10 Reassessment of Co-morbidities (includes updates in patient status) X- 1 5 Reassessment of Adherence to Treatment Plan ASSESSMENTS - Wound and Skin A ssessment / Reassessment []  - 0 Simple Wound Assessment / Reassessment - one wound X- 6 5 Complex Wound Assessment / Reassessment - multiple wounds []  - 0 Dermatologic / Skin Assessment (not related to wound area) ASSESSMENTS - Focused Assessment X- 1 5 Circumferential Edema Measurements - multi extremities []  - 0 Nutritional Assessment / Counseling / Intervention X- 1 5 Lower Extremity Assessment (monofilament, tuning fork, pulses) []  - 0 Peripheral Arterial Disease Assessment (using hand held doppler) ASSESSMENTS - Ostomy and/or Continence Assessment and Care []  - 0 Incontinence Assessment and Management []  - 0 Ostomy Care Assessment and Management (repouching, etc.) PROCESS - Coordination of Care X - Simple Patient / Family Education for ongoing care 1 15 []  - 0 Complex (extensive) Patient / Family Education for ongoing care X- 1 10 Staff obtains Programmer, systems, Records, T Results / Process Orders est []  - 0 Staff telephones HHA, Nursing Homes / Clarify orders / etc []  - 0 Routine Transfer to another Facility (non-emergent condition) []  - 0 Routine Hospital Admission (non-emergent condition) []  - 0 New Admissions / Biomedical engineer / Ordering NPWT Apligraf, etc. , []  - 0 Emergency Hospital Admission (emergent condition) X- 1 10 Simple Discharge Coordination []  - 0 Complex (extensive) Discharge Coordination PROCESS - Special Needs []  - 0 Pediatric / Minor Patient Management []  - 0 Isolation Patient Management []  - 0 Hearing / Language / Visual special needs []  - 0 Assessment of  Community assistance (transportation, D/C planning, etc.) []  - 0 Additional assistance / Altered mentation []  - 0 Support Surface(s) Assessment (bed, cushion, seat, etc.) INTERVENTIONS -  RANA, ADORNO ([id number]) Visit Report for 03/06/2021 Arrival Information Details Patient Name: Date of Service: [name] [name] [facility] F. 03/06/2021 8:00 A M Medical Record Number: [id number] Patient Account Number: [id number] Date of Birth/Sex: Treating RN: 1945/05/17 (75 y.o. [name] [name], Vaughan Basta Primary Care Sherrol Vicars: [name], [name] Other Clinician: Referring Rolonda Pontarelli: Treating Liba Hulsey/Extender: Hoffman, Jessica [name], [name] Weeks in Treatment: 254 Visit Information History Since Last Visit Added or deleted any medications: No Patient Arrived: Wheel Chair Any new allergies or adverse reactions: No Arrival Time: 08:16 Had a fall or experienced change in No Accompanied By: family activities of daily living that may affect Transfer Assistance: Manual risk of falls: Patient Identification Verified: Yes Signs or symptoms of abuse/neglect since last visito No Secondary Verification Process Completed: Yes Hospitalized since last visit: No Patient Requires Transmission-Based Precautions: No Implantable device outside of the clinic excluding No Patient Has Alerts: Yes cellular tissue based products placed in the center Patient Alerts: Patient on Blood Thinner since last visit: left ABI 1.0; rt ABI 1.09 Has Dressing in Place as Prescribed: Yes Has Compression in Place as Prescribed: Yes Pain Present Now: No Electronic Signature(s) Signed: 03/06/2021 4:40:13 PM By: Baruch Gouty RN, BSN Entered By: Baruch Gouty on 03/06/2021 08:20:59 -------------------------------------------------------------------------------- Clinic Level of Care Assessment Details Patient Name: Date of Service: [name] [name] F. 03/06/2021 8:00 A M Medical Record Number: [id number] Patient Account Number: [id number] Date of Birth/Sex: Treating RN: [birth year] (75 y.o. [name] Primary Care Adelis Docter: [name], [name] Other Clinician: Referring Oryon Gary: Treating Zyra Parrillo/Extender: Hoffman,  Jessica [name], [name] Weeks in Treatment: 254 Clinic Level of Care Assessment Items TOOL 4 Quantity Score []  - 0 Use when only an EandM is performed on FOLLOW-UP visit ASSESSMENTS - Nursing Assessment / Reassessment X- 1 10 Reassessment of Co-morbidities (includes updates in patient status) X- 1 5 Reassessment of Adherence to Treatment Plan ASSESSMENTS - Wound and Skin A ssessment / Reassessment []  - 0 Simple Wound Assessment / Reassessment - one wound X- 6 5 Complex Wound Assessment / Reassessment - multiple wounds []  - 0 Dermatologic / Skin Assessment (not related to wound area) ASSESSMENTS - Focused Assessment X- 1 5 Circumferential Edema Measurements - multi extremities []  - 0 Nutritional Assessment / Counseling / Intervention X- 1 5 Lower Extremity Assessment (monofilament, tuning fork, pulses) []  - 0 Peripheral Arterial Disease Assessment (using hand held doppler) ASSESSMENTS - Ostomy and/or Continence Assessment and Care []  - 0 Incontinence Assessment and Management []  - 0 Ostomy Care Assessment and Management (repouching, etc.) PROCESS - Coordination of Care X - Simple Patient / Family Education for ongoing care 1 15 []  - 0 Complex (extensive) Patient / Family Education for ongoing care X- 1 10 Staff obtains Programmer, systems, Records, T Results / Process Orders est []  - 0 Staff telephones HHA, Nursing Homes / Clarify orders / etc []  - 0 Routine Transfer to another Facility (non-emergent condition) []  - 0 Routine Hospital Admission (non-emergent condition) []  - 0 New Admissions / Biomedical engineer / Ordering NPWT Apligraf, etc. , []  - 0 Emergency Hospital Admission (emergent condition) X- 1 10 Simple Discharge Coordination []  - 0 Complex (extensive) Discharge Coordination PROCESS - Special Needs []  - 0 Pediatric / Minor Patient Management []  - 0 Isolation Patient Management []  - 0 Hearing / Language / Visual special needs []  - 0 Assessment of  Community assistance (transportation, D/C planning, etc.) []  - 0 Additional assistance / Altered mentation []  - 0 Support Surface(s) Assessment (bed, cushion, seat, etc.) INTERVENTIONS -

## 2021-03-06 NOTE — Progress Notes (Signed)
combined; Coronary Artery Disease - dx'd with; Hypertension - on meds; Myocardial Infarction - 2005 , large NSTEMI 10/2015 Negative for: Angina; Deep Vein Thrombosis; Hypotension; Peripheral Arterial Disease; Peripheral Venous Disease; Phlebitis; Vasculitis Past Medical History Notes: ischemin cardiomyopathy , hyperlipidemia , possible new CVA - known CVA in 60 ; EJECTION FRACTINO -15% Gastrointestinal Medical History: Negative for: Cirrhosis ; Colitis; Crohns; Hepatitis A; Hepatitis B; Hepatitis C Endocrine Medical History: Positive for: Type II Diabetes - on insulin Negative for: Type I Diabetes Past Medical History Notes: uncontrolled -A1C on 10/2015 - 12.0 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 Time with diabetes: 12 years Treated with: Insulin Blood sugar tested every day: Yes T ested : 3x/day Blood sugar  testing results: Breakfast: insulin; Lunch: insulin; Dinner: insulin; Bedtime: insulin Genitourinary Medical History: Negative for: End Stage Renal Disease Past Medical History Notes: acute on chronic kidney disease Stage 3 , recent Klebisella UTI , Immunological Medical History: Negative for: Lupus Erythematosus; Raynauds; Scleroderma Musculoskeletal Medical History: Positive for: Gout - sporadic Negative for: Rheumatoid Arthritis; Osteoarthritis; Osteomyelitis Neurologic Medical History: Positive for: Neuropathy - bilat lower ext Negative for: Dementia; Quadriplegia; Paraplegia; Seizure Disorder Oncologic Medical History: Negative for: Received Chemotherapy; Received Radiation Psychiatric Medical History: Negative for: Anorexia/bulimia; Confinement Anxiety Immunizations Pneumococcal Vaccine: Received Pneumococcal Vaccination: No Immunization Notes: 8 years ago Implantable Devices No devices added Hospitalization / Surgery History Type of Hospitalization/Surgery kidney infection kidney infection/urinary retention left AKA 10/10/2020 Family and Social History Cancer: No; Diabetes: No; Heart Disease: Yes - Maternal Grandparents,Paternal Grandparents,Mother,Father; Hereditary Spherocytosis: No; Hypertension: Yes - Mother,Maternal Grandparents; Kidney Disease: No; Lung Disease: Yes - Mother; Seizures: No; Stroke: No; Thyroid Problems: No; Tuberculosis: No; Never smoker; Marital Status - Married; Alcohol Use: Never; Drug Use: No History; Caffeine Use: Never; Financial Concerns: No; Food, Clothing or Shelter Needs: No; Support System Lacking: No; Transportation Concerns: No Electronic Signature(s) Signed: 03/06/2021 10:02:51 AM By: Kalman Shan DO Signed: 03/06/2021 4:35:28 PM By: Lorrin Jackson Entered By: Kalman Shan on 03/06/2021 09:55:27 -------------------------------------------------------------------------------- SuperBill Details Patient Name: Date of  Service: [name] Foley F. 03/06/2021 Medical Record Number: [id number] Patient Account Number: [id number] Date of Birth/Sex: Treating RN: 10-06-1945 (75 y.o. [name] [name] Primary Care Provider: [name], [name] Other Clinician: Referring Provider: Treating Provider/Extender: Angeleah Labrake [name], [name] Weeks in Treatment: 254 Diagnosis Coding ICD-10 Codes Code Description E11.621 Type 2 diabetes mellitus with foot ulcer L97.518 Non-pressure chronic ulcer of other part of right foot with other specified severity L97.218 Non-pressure chronic ulcer of right calf with other specified severity S80.211D Abrasion, right knee, subsequent encounter L97.418 Non-pressure chronic ulcer of right heel and midfoot with other specified severity S80.211D Abrasion, right knee, subsequent encounter Facility Procedures CPT4 Code: [id number] Description: [phone] - WOUND CARE VISIT-LEV 5 EST PT Modifier: Quantity: 1 Physician Procedures : CPT4 Code Description Modifier [id number] - WC PHYS LEVEL 3 - EST PT ICD-10 Diagnosis Description E11.621 Type 2 diabetes mellitus with foot ulcer L97.518 Non-pressure chronic ulcer of other part of right foot with other specified severity S80.211D  Abrasion, right knee, subsequent encounter I45.809 Non-pressure chronic ulcer of right calf with other specified severity Quantity: 1 Electronic Signature(s) Signed: 03/06/2021 12:16:26 PM By: Kalman Shan DO Signed: 03/06/2021 4:40:13 PM By: Baruch Gouty RN, BSN Previous Signature: 03/06/2021 10:02:51 AM Version By: Kalman Shan DO Entered By: Baruch Gouty on 03/06/2021 11:32:18  upper thigh popliteal and posterior tibial artery. TBI was 0.76 great toe pressure was 100. They did not think that there was significant PAD. He had already had a right popliteal artery and right posterior tibial artery angioplasty. Follow-up in 6 months We have been using polymen. 11/14; patient presents for evaluation of increased weeping to his right lower extremity. He states that last week he had increased serous drainage but this has since resolved after 1 day. He has no issues or complaints today. He denies signs of infection. Electronic Signature(s) Signed: 03/06/2021 10:02:51 AM By: Kalman Shan DO Entered By: Kalman Shan on 03/06/2021 09:55:15 -------------------------------------------------------------------------------- Physical Exam Details Patient Name: Date of Service: [name] Foley F. 03/06/2021 8:00 A M Medical Record Number: [id number] Patient Account Number: [id number] Date of Birth/Sex: Treating RN: [birth year] (75 y.o. [name] [name] Primary Care Provider: [name], [name] Other Clinician: Referring Provider: Treating Provider/Extender: Hiroshi Krummel [name], [name] Weeks in Treatment: 254 Constitutional respirations regular, non-labored and within target range for patient.Marland Kitchen Psychiatric pleasant and cooperative. Notes Right dorsal foot: Tunnel that probes to bone. Right heel: Open wound with granulation tissue nonviable tissue present. Areas of skin breakdown to the right lateral lower leg. Mild serous drainage on exam. No obvious signs of infection. Electronic Signature(s) Signed: 03/06/2021 10:02:51 AM By: Kalman Shan DO Entered By: Kalman Shan on 03/06/2021  09:57:10 -------------------------------------------------------------------------------- Physician Orders Details Patient Name: Date of Service: [name] Foley F. 03/06/2021 8:00 A M Medical Record Number: [id number] Patient Account Number: [id number] Date of Birth/Sex: Treating RN: [birth year] (75 y.o. [name] [name] Primary Care Provider: [name], [name] Other Clinician: Referring Provider: Treating Provider/Extender: Kazuto Sevey [name], [name] Weeks in Treatment: [phone] Verbal / Phone Orders: No Diagnosis Coding Follow-up Appointments ppointment in 1 week. - Dr. Dellia Nims - Tuesday Return A Bathing/ Shower/ Hygiene May shower with protection but do not get wound dressing(s) wet. Edema Control - Lymphedema / SCD / Other Elevate legs to the level of the heart or above for 30 minutes daily and/or when sitting, a frequency of: - throughout the day Avoid standing for long periods of time. Off-Loading Other: - multipodus boots to both feet, elevate heel off the bed with pillows under calf, avoid turning leg out to the side for long periods of time Wound Treatment Wound #25 - Lower Leg Wound Laterality: Right, Lateral Cleanser: Wound Cleanser (Generic) Every Other Day/30 Days Discharge Instructions: Cleanse the wound with wound cleanser prior to applying a clean dressing using gauze sponges, not tissue or cotton balls. Cleanser: Soap and Water Every Other Day/30 Days Discharge Instructions: May shower and wash wound with dial antibacterial soap and water prior to dressing change. Peri-Wound Care: Triamcinolone 15 (g) Every Other Day/30 Days Discharge Instructions: Use triamcinolone 15 (g) as directed Peri-Wound Care: Zinc Oxide Ointment 30g tube Every Other Day/30 Days Discharge Instructions: Apply Zinc Oxide to excoriated periwound with each dressing change Peri-Wound Care: Sween Lotion (Moisturizing lotion) Every Other Day/30 Days Discharge Instructions: Apply moisturizing lotion  as directed Prim Dressing: PolyMem Silver Non-Adhesive Dressing, 4.25x4.25 in (Generic) Every Other Day/30 Days ary Discharge Instructions: Apply to wound bed as instructed Secondary Dressing: Woven Gauze Sponge, Non-Sterile 4x4 in (Generic) Every Other Day/30 Days Discharge Instructions: Apply over primary dressing as directed. Secondary Dressing: ABD Pad, 5x9 (Generic) Every Other Day/30 Days Discharge Instructions: Apply over primary dressing as directed. Compression Wrap: Kerlix Roll 4.5x3.1 (in/yd) (Generic) Every Other Day/30 Days Discharge Instructions: Apply Kerlix and Coban compression as directed. Compression Wrap: Coban  dial antibacterial soap and water prior to dressing change. Peri-Wound Care: Triamcinolone 15 (g) Every Other Day/30 Days Discharge Instructions: Use triamcinolone 15 (g) as directed Peri-Wound Care: Zinc Oxide Ointment 30g tube Every Other Day/30 Days Discharge Instructions: Apply Zinc Oxide to excoriated periwound with each dressing change Peri-Wound Care: Sween Lotion (Moisturizing lotion) Every Other Day/30 Days Discharge Instructions: Apply moisturizing lotion as directed Prim Dressing: PolyMem Silver Non-Adhesive Dressing, 4.25x4.25 in (Generic) Every Other Day/30 Days ary Discharge Instructions: Apply to wound bed as instructed Secondary Dressing: Woven Gauze Sponge, Non-Sterile 4x4 in (Generic) Every Other Day/30 Days Discharge Instructions: Apply over primary dressing as directed. Secondary Dressing: ABD Pad, 5x9 (Generic) Every Other Day/30 Days Discharge Instructions: Apply over primary dressing as directed. Com pression Wrap: Kerlix Roll 4.5x3.1 (in/yd) (Generic) Every Other Day/30 Days Discharge Instructions: Apply Kerlix and Coban compression as directed. Com pression Wrap: Coban Self-Adherent Wrap 4x5 (in/yd) (Generic) Every Other Day/30 Days Discharge Instructions: Apply over Kerlix as directed. WOUND #42: - Lower Leg Wound Laterality: Right, Anterior, Proximal Cleanser: Wound Cleanser (Generic) Every Other Day/30 Days Discharge Instructions: Cleanse the wound with wound cleanser prior to applying a clean dressing using gauze sponges, not tissue or cotton balls. Cleanser: Soap and  Water Every Other Day/30 Days Discharge Instructions: May shower and wash wound with dial antibacterial soap and water prior to dressing change. Peri-Wound Care: Triamcinolone 15 (g) Every Other Day/30 Days Discharge Instructions: Use triamcinolone 15 (g) as directed Peri-Wound Care: Sween Lotion (Moisturizing lotion) Every Other Day/30 Days Discharge Instructions: Apply moisturizing lotion as directed Prim Dressing: PolyMem Silver Non-Adhesive Dressing, 4.25x4.25 in Every Other Day/30 Days ary Discharge Instructions: Apply to wound bed as instructed Secondary Dressing: Woven Gauze Sponge, Non-Sterile 4x4 in (Generic) Every Other Day/30 Days Discharge Instructions: Apply over primary dressing as directed. Com pression Wrap: Kerlix Roll 4.5x3.1 (in/yd) (Generic) Every Other Day/30 Days Discharge Instructions: Apply Kerlix and Coban compression as directed. Com pression Wrap: Coban Self-Adherent Wrap 4x5 (in/yd) (Generic) Every Other Day/30 Days Discharge Instructions: Apply over Kerlix as directed. WOUND #43: - Knee Wound Laterality: Right Prim Dressing: PolyMem Silver Non-Adhesive Dressing, 4.25x4.25 in Every Other Day/30 Days ary Discharge Instructions: Apply to wound bed as instructed Secondary Dressing: Zetuvit Plus Silicone Border Dressing 5x5 (in/in) Every Other Day/30 Days Discharge Instructions: Apply silicone border over primary dressing as directed. 1. TCA with zinc oxide and PolyMem silver under Kerlix/Coban 2. PolyMem silver to the remaining wounds 3. Follow-up next week with Dr. Dellia Nims Electronic Signature(s) Signed: 03/06/2021 10:02:51 AM By: Kalman Shan DO Entered By: Kalman Shan on 03/06/2021 10:02:01 -------------------------------------------------------------------------------- HxROS Details Patient Name: Date of Service: [name] Foley F. 03/06/2021 8:00 A M Medical Record Number: [id number] Patient Account Number: [id number] Date of  Birth/Sex: Treating RN: [birth year] (75 y.o. [name] [name] Primary Care Provider: [name], [name] Other Clinician: Referring Provider: Treating Provider/Extender: Mariame Rybolt [name], [name] Weeks in Treatment: 254 Information Obtained From Patient Constitutional Symptoms (General Health) Medical History: Past Medical History Notes: acute encephalopathy Eyes Medical History: Negative for: Cataracts; Glaucoma; Optic Neuritis Ear/Nose/Mouth/Throat Medical History: Negative for: Chronic sinus problems/congestion; Middle ear problems Hematologic/Lymphatic Medical History: Positive for: Anemia Negative for: Hemophilia; Human Immunodeficiency Virus; Lymphedema; Sickle Cell Disease Respiratory Medical History: Positive for: Sleep Apnea - unable to tolerate devices Negative for: Aspiration; Asthma; Chronic Obstructive Pulmonary Disease (COPD); Pneumothorax; Tuberculosis Past Medical History Notes: acute hypoxic resp. failure due to aspiration pneumonia Cardiovascular Medical History: Positive for: Arrhythmia - defib and pacer inplanted; Congestive Heart Failure -  dial antibacterial soap and water prior to dressing change. Peri-Wound Care: Triamcinolone 15 (g) Every Other Day/30 Days Discharge Instructions: Use triamcinolone 15 (g) as directed Peri-Wound Care: Zinc Oxide Ointment 30g tube Every Other Day/30 Days Discharge Instructions: Apply Zinc Oxide to excoriated periwound with each dressing change Peri-Wound Care: Sween Lotion (Moisturizing lotion) Every Other Day/30 Days Discharge Instructions: Apply moisturizing lotion as directed Prim Dressing: PolyMem Silver Non-Adhesive Dressing, 4.25x4.25 in (Generic) Every Other Day/30 Days ary Discharge Instructions: Apply to wound bed as instructed Secondary Dressing: Woven Gauze Sponge, Non-Sterile 4x4 in (Generic) Every Other Day/30 Days Discharge Instructions: Apply over primary dressing as directed. Secondary Dressing: ABD Pad, 5x9 (Generic) Every Other Day/30 Days Discharge Instructions: Apply over primary dressing as directed. Com pression Wrap: Kerlix Roll 4.5x3.1 (in/yd) (Generic) Every Other Day/30 Days Discharge Instructions: Apply Kerlix and Coban compression as directed. Com pression Wrap: Coban Self-Adherent Wrap 4x5 (in/yd) (Generic) Every Other Day/30 Days Discharge Instructions: Apply over Kerlix as directed. WOUND #42: - Lower Leg Wound Laterality: Right, Anterior, Proximal Cleanser: Wound Cleanser (Generic) Every Other Day/30 Days Discharge Instructions: Cleanse the wound with wound cleanser prior to applying a clean dressing using gauze sponges, not tissue or cotton balls. Cleanser: Soap and  Water Every Other Day/30 Days Discharge Instructions: May shower and wash wound with dial antibacterial soap and water prior to dressing change. Peri-Wound Care: Triamcinolone 15 (g) Every Other Day/30 Days Discharge Instructions: Use triamcinolone 15 (g) as directed Peri-Wound Care: Sween Lotion (Moisturizing lotion) Every Other Day/30 Days Discharge Instructions: Apply moisturizing lotion as directed Prim Dressing: PolyMem Silver Non-Adhesive Dressing, 4.25x4.25 in Every Other Day/30 Days ary Discharge Instructions: Apply to wound bed as instructed Secondary Dressing: Woven Gauze Sponge, Non-Sterile 4x4 in (Generic) Every Other Day/30 Days Discharge Instructions: Apply over primary dressing as directed. Com pression Wrap: Kerlix Roll 4.5x3.1 (in/yd) (Generic) Every Other Day/30 Days Discharge Instructions: Apply Kerlix and Coban compression as directed. Com pression Wrap: Coban Self-Adherent Wrap 4x5 (in/yd) (Generic) Every Other Day/30 Days Discharge Instructions: Apply over Kerlix as directed. WOUND #43: - Knee Wound Laterality: Right Prim Dressing: PolyMem Silver Non-Adhesive Dressing, 4.25x4.25 in Every Other Day/30 Days ary Discharge Instructions: Apply to wound bed as instructed Secondary Dressing: Zetuvit Plus Silicone Border Dressing 5x5 (in/in) Every Other Day/30 Days Discharge Instructions: Apply silicone border over primary dressing as directed. 1. TCA with zinc oxide and PolyMem silver under Kerlix/Coban 2. PolyMem silver to the remaining wounds 3. Follow-up next week with Dr. Dellia Nims Electronic Signature(s) Signed: 03/06/2021 10:02:51 AM By: Kalman Shan DO Entered By: Kalman Shan on 03/06/2021 10:02:01 -------------------------------------------------------------------------------- HxROS Details Patient Name: Date of Service: [name] Foley F. 03/06/2021 8:00 A M Medical Record Number: [id number] Patient Account Number: [id number] Date of  Birth/Sex: Treating RN: [birth year] (75 y.o. [name] [name] Primary Care Provider: [name], [name] Other Clinician: Referring Provider: Treating Provider/Extender: Mariame Rybolt [name], [name] Weeks in Treatment: 254 Information Obtained From Patient Constitutional Symptoms (General Health) Medical History: Past Medical History Notes: acute encephalopathy Eyes Medical History: Negative for: Cataracts; Glaucoma; Optic Neuritis Ear/Nose/Mouth/Throat Medical History: Negative for: Chronic sinus problems/congestion; Middle ear problems Hematologic/Lymphatic Medical History: Positive for: Anemia Negative for: Hemophilia; Human Immunodeficiency Virus; Lymphedema; Sickle Cell Disease Respiratory Medical History: Positive for: Sleep Apnea - unable to tolerate devices Negative for: Aspiration; Asthma; Chronic Obstructive Pulmonary Disease (COPD); Pneumothorax; Tuberculosis Past Medical History Notes: acute hypoxic resp. failure due to aspiration pneumonia Cardiovascular Medical History: Positive for: Arrhythmia - defib and pacer inplanted; Congestive Heart Failure -  dial antibacterial soap and water prior to dressing change. Peri-Wound Care: Triamcinolone 15 (g) Every Other Day/30 Days Discharge Instructions: Use triamcinolone 15 (g) as directed Peri-Wound Care: Zinc Oxide Ointment 30g tube Every Other Day/30 Days Discharge Instructions: Apply Zinc Oxide to excoriated periwound with each dressing change Peri-Wound Care: Sween Lotion (Moisturizing lotion) Every Other Day/30 Days Discharge Instructions: Apply moisturizing lotion as directed Prim Dressing: PolyMem Silver Non-Adhesive Dressing, 4.25x4.25 in (Generic) Every Other Day/30 Days ary Discharge Instructions: Apply to wound bed as instructed Secondary Dressing: Woven Gauze Sponge, Non-Sterile 4x4 in (Generic) Every Other Day/30 Days Discharge Instructions: Apply over primary dressing as directed. Secondary Dressing: ABD Pad, 5x9 (Generic) Every Other Day/30 Days Discharge Instructions: Apply over primary dressing as directed. Com pression Wrap: Kerlix Roll 4.5x3.1 (in/yd) (Generic) Every Other Day/30 Days Discharge Instructions: Apply Kerlix and Coban compression as directed. Com pression Wrap: Coban Self-Adherent Wrap 4x5 (in/yd) (Generic) Every Other Day/30 Days Discharge Instructions: Apply over Kerlix as directed. WOUND #42: - Lower Leg Wound Laterality: Right, Anterior, Proximal Cleanser: Wound Cleanser (Generic) Every Other Day/30 Days Discharge Instructions: Cleanse the wound with wound cleanser prior to applying a clean dressing using gauze sponges, not tissue or cotton balls. Cleanser: Soap and  Water Every Other Day/30 Days Discharge Instructions: May shower and wash wound with dial antibacterial soap and water prior to dressing change. Peri-Wound Care: Triamcinolone 15 (g) Every Other Day/30 Days Discharge Instructions: Use triamcinolone 15 (g) as directed Peri-Wound Care: Sween Lotion (Moisturizing lotion) Every Other Day/30 Days Discharge Instructions: Apply moisturizing lotion as directed Prim Dressing: PolyMem Silver Non-Adhesive Dressing, 4.25x4.25 in Every Other Day/30 Days ary Discharge Instructions: Apply to wound bed as instructed Secondary Dressing: Woven Gauze Sponge, Non-Sterile 4x4 in (Generic) Every Other Day/30 Days Discharge Instructions: Apply over primary dressing as directed. Com pression Wrap: Kerlix Roll 4.5x3.1 (in/yd) (Generic) Every Other Day/30 Days Discharge Instructions: Apply Kerlix and Coban compression as directed. Com pression Wrap: Coban Self-Adherent Wrap 4x5 (in/yd) (Generic) Every Other Day/30 Days Discharge Instructions: Apply over Kerlix as directed. WOUND #43: - Knee Wound Laterality: Right Prim Dressing: PolyMem Silver Non-Adhesive Dressing, 4.25x4.25 in Every Other Day/30 Days ary Discharge Instructions: Apply to wound bed as instructed Secondary Dressing: Zetuvit Plus Silicone Border Dressing 5x5 (in/in) Every Other Day/30 Days Discharge Instructions: Apply silicone border over primary dressing as directed. 1. TCA with zinc oxide and PolyMem silver under Kerlix/Coban 2. PolyMem silver to the remaining wounds 3. Follow-up next week with Dr. Dellia Nims Electronic Signature(s) Signed: 03/06/2021 10:02:51 AM By: Kalman Shan DO Entered By: Kalman Shan on 03/06/2021 10:02:01 -------------------------------------------------------------------------------- HxROS Details Patient Name: Date of Service: [name] Foley F. 03/06/2021 8:00 A M Medical Record Number: [id number] Patient Account Number: [id number] Date of  Birth/Sex: Treating RN: [birth year] (75 y.o. [name] [name] Primary Care Provider: [name], [name] Other Clinician: Referring Provider: Treating Provider/Extender: Mariame Rybolt [name], [name] Weeks in Treatment: 254 Information Obtained From Patient Constitutional Symptoms (General Health) Medical History: Past Medical History Notes: acute encephalopathy Eyes Medical History: Negative for: Cataracts; Glaucoma; Optic Neuritis Ear/Nose/Mouth/Throat Medical History: Negative for: Chronic sinus problems/congestion; Middle ear problems Hematologic/Lymphatic Medical History: Positive for: Anemia Negative for: Hemophilia; Human Immunodeficiency Virus; Lymphedema; Sickle Cell Disease Respiratory Medical History: Positive for: Sleep Apnea - unable to tolerate devices Negative for: Aspiration; Asthma; Chronic Obstructive Pulmonary Disease (COPD); Pneumothorax; Tuberculosis Past Medical History Notes: acute hypoxic resp. failure due to aspiration pneumonia Cardiovascular Medical History: Positive for: Arrhythmia - defib and pacer inplanted; Congestive Heart Failure -  dial antibacterial soap and water prior to dressing change. Peri-Wound Care: Triamcinolone 15 (g) Every Other Day/30 Days Discharge Instructions: Use triamcinolone 15 (g) as directed Peri-Wound Care: Zinc Oxide Ointment 30g tube Every Other Day/30 Days Discharge Instructions: Apply Zinc Oxide to excoriated periwound with each dressing change Peri-Wound Care: Sween Lotion (Moisturizing lotion) Every Other Day/30 Days Discharge Instructions: Apply moisturizing lotion as directed Prim Dressing: PolyMem Silver Non-Adhesive Dressing, 4.25x4.25 in (Generic) Every Other Day/30 Days ary Discharge Instructions: Apply to wound bed as instructed Secondary Dressing: Woven Gauze Sponge, Non-Sterile 4x4 in (Generic) Every Other Day/30 Days Discharge Instructions: Apply over primary dressing as directed. Secondary Dressing: ABD Pad, 5x9 (Generic) Every Other Day/30 Days Discharge Instructions: Apply over primary dressing as directed. Com pression Wrap: Kerlix Roll 4.5x3.1 (in/yd) (Generic) Every Other Day/30 Days Discharge Instructions: Apply Kerlix and Coban compression as directed. Com pression Wrap: Coban Self-Adherent Wrap 4x5 (in/yd) (Generic) Every Other Day/30 Days Discharge Instructions: Apply over Kerlix as directed. WOUND #42: - Lower Leg Wound Laterality: Right, Anterior, Proximal Cleanser: Wound Cleanser (Generic) Every Other Day/30 Days Discharge Instructions: Cleanse the wound with wound cleanser prior to applying a clean dressing using gauze sponges, not tissue or cotton balls. Cleanser: Soap and  Water Every Other Day/30 Days Discharge Instructions: May shower and wash wound with dial antibacterial soap and water prior to dressing change. Peri-Wound Care: Triamcinolone 15 (g) Every Other Day/30 Days Discharge Instructions: Use triamcinolone 15 (g) as directed Peri-Wound Care: Sween Lotion (Moisturizing lotion) Every Other Day/30 Days Discharge Instructions: Apply moisturizing lotion as directed Prim Dressing: PolyMem Silver Non-Adhesive Dressing, 4.25x4.25 in Every Other Day/30 Days ary Discharge Instructions: Apply to wound bed as instructed Secondary Dressing: Woven Gauze Sponge, Non-Sterile 4x4 in (Generic) Every Other Day/30 Days Discharge Instructions: Apply over primary dressing as directed. Com pression Wrap: Kerlix Roll 4.5x3.1 (in/yd) (Generic) Every Other Day/30 Days Discharge Instructions: Apply Kerlix and Coban compression as directed. Com pression Wrap: Coban Self-Adherent Wrap 4x5 (in/yd) (Generic) Every Other Day/30 Days Discharge Instructions: Apply over Kerlix as directed. WOUND #43: - Knee Wound Laterality: Right Prim Dressing: PolyMem Silver Non-Adhesive Dressing, 4.25x4.25 in Every Other Day/30 Days ary Discharge Instructions: Apply to wound bed as instructed Secondary Dressing: Zetuvit Plus Silicone Border Dressing 5x5 (in/in) Every Other Day/30 Days Discharge Instructions: Apply silicone border over primary dressing as directed. 1. TCA with zinc oxide and PolyMem silver under Kerlix/Coban 2. PolyMem silver to the remaining wounds 3. Follow-up next week with Dr. Dellia Nims Electronic Signature(s) Signed: 03/06/2021 10:02:51 AM By: Kalman Shan DO Entered By: Kalman Shan on 03/06/2021 10:02:01 -------------------------------------------------------------------------------- HxROS Details Patient Name: Date of Service: [name] Foley F. 03/06/2021 8:00 A M Medical Record Number: [id number] Patient Account Number: [id number] Date of  Birth/Sex: Treating RN: [birth year] (75 y.o. [name] [name] Primary Care Provider: [name], [name] Other Clinician: Referring Provider: Treating Provider/Extender: Mariame Rybolt [name], [name] Weeks in Treatment: 254 Information Obtained From Patient Constitutional Symptoms (General Health) Medical History: Past Medical History Notes: acute encephalopathy Eyes Medical History: Negative for: Cataracts; Glaucoma; Optic Neuritis Ear/Nose/Mouth/Throat Medical History: Negative for: Chronic sinus problems/congestion; Middle ear problems Hematologic/Lymphatic Medical History: Positive for: Anemia Negative for: Hemophilia; Human Immunodeficiency Virus; Lymphedema; Sickle Cell Disease Respiratory Medical History: Positive for: Sleep Apnea - unable to tolerate devices Negative for: Aspiration; Asthma; Chronic Obstructive Pulmonary Disease (COPD); Pneumothorax; Tuberculosis Past Medical History Notes: acute hypoxic resp. failure due to aspiration pneumonia Cardiovascular Medical History: Positive for: Arrhythmia - defib and pacer inplanted; Congestive Heart Failure -  combined; Coronary Artery Disease - dx'd with; Hypertension - on meds; Myocardial Infarction - 2005 , large NSTEMI 10/2015 Negative for: Angina; Deep Vein Thrombosis; Hypotension; Peripheral Arterial Disease; Peripheral Venous Disease; Phlebitis; Vasculitis Past Medical History Notes: ischemin cardiomyopathy , hyperlipidemia , possible new CVA - known CVA in 60 ; EJECTION FRACTINO -15% Gastrointestinal Medical History: Negative for: Cirrhosis ; Colitis; Crohns; Hepatitis A; Hepatitis B; Hepatitis C Endocrine Medical History: Positive for: Type II Diabetes - on insulin Negative for: Type I Diabetes Past Medical History Notes: uncontrolled -A1C on 10/2015 - 12.0 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 Time with diabetes: 12 years Treated with: Insulin Blood sugar tested every day: Yes T ested : 3x/day Blood sugar  testing results: Breakfast: insulin; Lunch: insulin; Dinner: insulin; Bedtime: insulin Genitourinary Medical History: Negative for: End Stage Renal Disease Past Medical History Notes: acute on chronic kidney disease Stage 3 , recent Klebisella UTI , Immunological Medical History: Negative for: Lupus Erythematosus; Raynauds; Scleroderma Musculoskeletal Medical History: Positive for: Gout - sporadic Negative for: Rheumatoid Arthritis; Osteoarthritis; Osteomyelitis Neurologic Medical History: Positive for: Neuropathy - bilat lower ext Negative for: Dementia; Quadriplegia; Paraplegia; Seizure Disorder Oncologic Medical History: Negative for: Received Chemotherapy; Received Radiation Psychiatric Medical History: Negative for: Anorexia/bulimia; Confinement Anxiety Immunizations Pneumococcal Vaccine: Received Pneumococcal Vaccination: No Immunization Notes: 8 years ago Implantable Devices No devices added Hospitalization / Surgery History Type of Hospitalization/Surgery kidney infection kidney infection/urinary retention left AKA 10/10/2020 Family and Social History Cancer: No; Diabetes: No; Heart Disease: Yes - Maternal Grandparents,Paternal Grandparents,Mother,Father; Hereditary Spherocytosis: No; Hypertension: Yes - Mother,Maternal Grandparents; Kidney Disease: No; Lung Disease: Yes - Mother; Seizures: No; Stroke: No; Thyroid Problems: No; Tuberculosis: No; Never smoker; Marital Status - Married; Alcohol Use: Never; Drug Use: No History; Caffeine Use: Never; Financial Concerns: No; Food, Clothing or Shelter Needs: No; Support System Lacking: No; Transportation Concerns: No Electronic Signature(s) Signed: 03/06/2021 10:02:51 AM By: Kalman Shan DO Signed: 03/06/2021 4:35:28 PM By: Lorrin Jackson Entered By: Kalman Shan on 03/06/2021 09:55:27 -------------------------------------------------------------------------------- SuperBill Details Patient Name: Date of  Service: [name] Foley F. 03/06/2021 Medical Record Number: [id number] Patient Account Number: [id number] Date of Birth/Sex: Treating RN: 10-06-1945 (75 y.o. [name] [name] Primary Care Provider: [name], [name] Other Clinician: Referring Provider: Treating Provider/Extender: Angeleah Labrake [name], [name] Weeks in Treatment: 254 Diagnosis Coding ICD-10 Codes Code Description E11.621 Type 2 diabetes mellitus with foot ulcer L97.518 Non-pressure chronic ulcer of other part of right foot with other specified severity L97.218 Non-pressure chronic ulcer of right calf with other specified severity S80.211D Abrasion, right knee, subsequent encounter L97.418 Non-pressure chronic ulcer of right heel and midfoot with other specified severity S80.211D Abrasion, right knee, subsequent encounter Facility Procedures CPT4 Code: [id number] Description: [phone] - WOUND CARE VISIT-LEV 5 EST PT Modifier: Quantity: 1 Physician Procedures : CPT4 Code Description Modifier [id number] - WC PHYS LEVEL 3 - EST PT ICD-10 Diagnosis Description E11.621 Type 2 diabetes mellitus with foot ulcer L97.518 Non-pressure chronic ulcer of other part of right foot with other specified severity S80.211D  Abrasion, right knee, subsequent encounter I45.809 Non-pressure chronic ulcer of right calf with other specified severity Quantity: 1 Electronic Signature(s) Signed: 03/06/2021 12:16:26 PM By: Kalman Shan DO Signed: 03/06/2021 4:40:13 PM By: Baruch Gouty RN, BSN Previous Signature: 03/06/2021 10:02:51 AM Version By: Kalman Shan DO Entered By: Baruch Gouty on 03/06/2021 11:32:18  upper thigh popliteal and posterior tibial artery. TBI was 0.76 great toe pressure was 100. They did not think that there was significant PAD. He had already had a right popliteal artery and right posterior tibial artery angioplasty. Follow-up in 6 months We have been using polymen. 11/14; patient presents for evaluation of increased weeping to his right lower extremity. He states that last week he had increased serous drainage but this has since resolved after 1 day. He has no issues or complaints today. He denies signs of infection. Electronic Signature(s) Signed: 03/06/2021 10:02:51 AM By: Kalman Shan DO Entered By: Kalman Shan on 03/06/2021 09:55:15 -------------------------------------------------------------------------------- Physical Exam Details Patient Name: Date of Service: [name] Foley F. 03/06/2021 8:00 A M Medical Record Number: [id number] Patient Account Number: [id number] Date of Birth/Sex: Treating RN: [birth year] (75 y.o. [name] [name] Primary Care Provider: [name], [name] Other Clinician: Referring Provider: Treating Provider/Extender: Hiroshi Krummel [name], [name] Weeks in Treatment: 254 Constitutional respirations regular, non-labored and within target range for patient.Marland Kitchen Psychiatric pleasant and cooperative. Notes Right dorsal foot: Tunnel that probes to bone. Right heel: Open wound with granulation tissue nonviable tissue present. Areas of skin breakdown to the right lateral lower leg. Mild serous drainage on exam. No obvious signs of infection. Electronic Signature(s) Signed: 03/06/2021 10:02:51 AM By: Kalman Shan DO Entered By: Kalman Shan on 03/06/2021  09:57:10 -------------------------------------------------------------------------------- Physician Orders Details Patient Name: Date of Service: [name] Foley F. 03/06/2021 8:00 A M Medical Record Number: [id number] Patient Account Number: [id number] Date of Birth/Sex: Treating RN: [birth year] (75 y.o. [name] [name] Primary Care Provider: [name], [name] Other Clinician: Referring Provider: Treating Provider/Extender: Kazuto Sevey [name], [name] Weeks in Treatment: [phone] Verbal / Phone Orders: No Diagnosis Coding Follow-up Appointments ppointment in 1 week. - Dr. Dellia Nims - Tuesday Return A Bathing/ Shower/ Hygiene May shower with protection but do not get wound dressing(s) wet. Edema Control - Lymphedema / SCD / Other Elevate legs to the level of the heart or above for 30 minutes daily and/or when sitting, a frequency of: - throughout the day Avoid standing for long periods of time. Off-Loading Other: - multipodus boots to both feet, elevate heel off the bed with pillows under calf, avoid turning leg out to the side for long periods of time Wound Treatment Wound #25 - Lower Leg Wound Laterality: Right, Lateral Cleanser: Wound Cleanser (Generic) Every Other Day/30 Days Discharge Instructions: Cleanse the wound with wound cleanser prior to applying a clean dressing using gauze sponges, not tissue or cotton balls. Cleanser: Soap and Water Every Other Day/30 Days Discharge Instructions: May shower and wash wound with dial antibacterial soap and water prior to dressing change. Peri-Wound Care: Triamcinolone 15 (g) Every Other Day/30 Days Discharge Instructions: Use triamcinolone 15 (g) as directed Peri-Wound Care: Zinc Oxide Ointment 30g tube Every Other Day/30 Days Discharge Instructions: Apply Zinc Oxide to excoriated periwound with each dressing change Peri-Wound Care: Sween Lotion (Moisturizing lotion) Every Other Day/30 Days Discharge Instructions: Apply moisturizing lotion  as directed Prim Dressing: PolyMem Silver Non-Adhesive Dressing, 4.25x4.25 in (Generic) Every Other Day/30 Days ary Discharge Instructions: Apply to wound bed as instructed Secondary Dressing: Woven Gauze Sponge, Non-Sterile 4x4 in (Generic) Every Other Day/30 Days Discharge Instructions: Apply over primary dressing as directed. Secondary Dressing: ABD Pad, 5x9 (Generic) Every Other Day/30 Days Discharge Instructions: Apply over primary dressing as directed. Compression Wrap: Kerlix Roll 4.5x3.1 (in/yd) (Generic) Every Other Day/30 Days Discharge Instructions: Apply Kerlix and Coban compression as directed. Compression Wrap: Coban  combined; Coronary Artery Disease - dx'd with; Hypertension - on meds; Myocardial Infarction - 2005 , large NSTEMI 10/2015 Negative for: Angina; Deep Vein Thrombosis; Hypotension; Peripheral Arterial Disease; Peripheral Venous Disease; Phlebitis; Vasculitis Past Medical History Notes: ischemin cardiomyopathy , hyperlipidemia , possible new CVA - known CVA in 60 ; EJECTION FRACTINO -15% Gastrointestinal Medical History: Negative for: Cirrhosis ; Colitis; Crohns; Hepatitis A; Hepatitis B; Hepatitis C Endocrine Medical History: Positive for: Type II Diabetes - on insulin Negative for: Type I Diabetes Past Medical History Notes: uncontrolled -A1C on 10/2015 - 12.0 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 Time with diabetes: 12 years Treated with: Insulin Blood sugar tested every day: Yes T ested : 3x/day Blood sugar  testing results: Breakfast: insulin; Lunch: insulin; Dinner: insulin; Bedtime: insulin Genitourinary Medical History: Negative for: End Stage Renal Disease Past Medical History Notes: acute on chronic kidney disease Stage 3 , recent Klebisella UTI , Immunological Medical History: Negative for: Lupus Erythematosus; Raynauds; Scleroderma Musculoskeletal Medical History: Positive for: Gout - sporadic Negative for: Rheumatoid Arthritis; Osteoarthritis; Osteomyelitis Neurologic Medical History: Positive for: Neuropathy - bilat lower ext Negative for: Dementia; Quadriplegia; Paraplegia; Seizure Disorder Oncologic Medical History: Negative for: Received Chemotherapy; Received Radiation Psychiatric Medical History: Negative for: Anorexia/bulimia; Confinement Anxiety Immunizations Pneumococcal Vaccine: Received Pneumococcal Vaccination: No Immunization Notes: 8 years ago Implantable Devices No devices added Hospitalization / Surgery History Type of Hospitalization/Surgery kidney infection kidney infection/urinary retention left AKA 10/10/2020 Family and Social History Cancer: No; Diabetes: No; Heart Disease: Yes - Maternal Grandparents,Paternal Grandparents,Mother,Father; Hereditary Spherocytosis: No; Hypertension: Yes - Mother,Maternal Grandparents; Kidney Disease: No; Lung Disease: Yes - Mother; Seizures: No; Stroke: No; Thyroid Problems: No; Tuberculosis: No; Never smoker; Marital Status - Married; Alcohol Use: Never; Drug Use: No History; Caffeine Use: Never; Financial Concerns: No; Food, Clothing or Shelter Needs: No; Support System Lacking: No; Transportation Concerns: No Electronic Signature(s) Signed: 03/06/2021 10:02:51 AM By: Kalman Shan DO Signed: 03/06/2021 4:35:28 PM By: Lorrin Jackson Entered By: Kalman Shan on 03/06/2021 09:55:27 -------------------------------------------------------------------------------- SuperBill Details Patient Name: Date of  Service: [name] Foley F. 03/06/2021 Medical Record Number: [id number] Patient Account Number: [id number] Date of Birth/Sex: Treating RN: 10-06-1945 (75 y.o. [name] [name] Primary Care Provider: [name], [name] Other Clinician: Referring Provider: Treating Provider/Extender: Angeleah Labrake [name], [name] Weeks in Treatment: 254 Diagnosis Coding ICD-10 Codes Code Description E11.621 Type 2 diabetes mellitus with foot ulcer L97.518 Non-pressure chronic ulcer of other part of right foot with other specified severity L97.218 Non-pressure chronic ulcer of right calf with other specified severity S80.211D Abrasion, right knee, subsequent encounter L97.418 Non-pressure chronic ulcer of right heel and midfoot with other specified severity S80.211D Abrasion, right knee, subsequent encounter Facility Procedures CPT4 Code: [id number] Description: [phone] - WOUND CARE VISIT-LEV 5 EST PT Modifier: Quantity: 1 Physician Procedures : CPT4 Code Description Modifier [id number] - WC PHYS LEVEL 3 - EST PT ICD-10 Diagnosis Description E11.621 Type 2 diabetes mellitus with foot ulcer L97.518 Non-pressure chronic ulcer of other part of right foot with other specified severity S80.211D  Abrasion, right knee, subsequent encounter I45.809 Non-pressure chronic ulcer of right calf with other specified severity Quantity: 1 Electronic Signature(s) Signed: 03/06/2021 12:16:26 PM By: Kalman Shan DO Signed: 03/06/2021 4:40:13 PM By: Baruch Gouty RN, BSN Previous Signature: 03/06/2021 10:02:51 AM Version By: Kalman Shan DO Entered By: Baruch Gouty on 03/06/2021 11:32:18  upper thigh popliteal and posterior tibial artery. TBI was 0.76 great toe pressure was 100. They did not think that there was significant PAD. He had already had a right popliteal artery and right posterior tibial artery angioplasty. Follow-up in 6 months We have been using polymen. 11/14; patient presents for evaluation of increased weeping to his right lower extremity. He states that last week he had increased serous drainage but this has since resolved after 1 day. He has no issues or complaints today. He denies signs of infection. Electronic Signature(s) Signed: 03/06/2021 10:02:51 AM By: Kalman Shan DO Entered By: Kalman Shan on 03/06/2021 09:55:15 -------------------------------------------------------------------------------- Physical Exam Details Patient Name: Date of Service: [name] Foley F. 03/06/2021 8:00 A M Medical Record Number: [id number] Patient Account Number: [id number] Date of Birth/Sex: Treating RN: [birth year] (75 y.o. [name] [name] Primary Care Provider: [name], [name] Other Clinician: Referring Provider: Treating Provider/Extender: Hiroshi Krummel [name], [name] Weeks in Treatment: 254 Constitutional respirations regular, non-labored and within target range for patient.Marland Kitchen Psychiatric pleasant and cooperative. Notes Right dorsal foot: Tunnel that probes to bone. Right heel: Open wound with granulation tissue nonviable tissue present. Areas of skin breakdown to the right lateral lower leg. Mild serous drainage on exam. No obvious signs of infection. Electronic Signature(s) Signed: 03/06/2021 10:02:51 AM By: Kalman Shan DO Entered By: Kalman Shan on 03/06/2021  09:57:10 -------------------------------------------------------------------------------- Physician Orders Details Patient Name: Date of Service: [name] Foley F. 03/06/2021 8:00 A M Medical Record Number: [id number] Patient Account Number: [id number] Date of Birth/Sex: Treating RN: [birth year] (75 y.o. [name] [name] Primary Care Provider: [name], [name] Other Clinician: Referring Provider: Treating Provider/Extender: Kazuto Sevey [name], [name] Weeks in Treatment: [phone] Verbal / Phone Orders: No Diagnosis Coding Follow-up Appointments ppointment in 1 week. - Dr. Dellia Nims - Tuesday Return A Bathing/ Shower/ Hygiene May shower with protection but do not get wound dressing(s) wet. Edema Control - Lymphedema / SCD / Other Elevate legs to the level of the heart or above for 30 minutes daily and/or when sitting, a frequency of: - throughout the day Avoid standing for long periods of time. Off-Loading Other: - multipodus boots to both feet, elevate heel off the bed with pillows under calf, avoid turning leg out to the side for long periods of time Wound Treatment Wound #25 - Lower Leg Wound Laterality: Right, Lateral Cleanser: Wound Cleanser (Generic) Every Other Day/30 Days Discharge Instructions: Cleanse the wound with wound cleanser prior to applying a clean dressing using gauze sponges, not tissue or cotton balls. Cleanser: Soap and Water Every Other Day/30 Days Discharge Instructions: May shower and wash wound with dial antibacterial soap and water prior to dressing change. Peri-Wound Care: Triamcinolone 15 (g) Every Other Day/30 Days Discharge Instructions: Use triamcinolone 15 (g) as directed Peri-Wound Care: Zinc Oxide Ointment 30g tube Every Other Day/30 Days Discharge Instructions: Apply Zinc Oxide to excoriated periwound with each dressing change Peri-Wound Care: Sween Lotion (Moisturizing lotion) Every Other Day/30 Days Discharge Instructions: Apply moisturizing lotion  as directed Prim Dressing: PolyMem Silver Non-Adhesive Dressing, 4.25x4.25 in (Generic) Every Other Day/30 Days ary Discharge Instructions: Apply to wound bed as instructed Secondary Dressing: Woven Gauze Sponge, Non-Sterile 4x4 in (Generic) Every Other Day/30 Days Discharge Instructions: Apply over primary dressing as directed. Secondary Dressing: ABD Pad, 5x9 (Generic) Every Other Day/30 Days Discharge Instructions: Apply over primary dressing as directed. Compression Wrap: Kerlix Roll 4.5x3.1 (in/yd) (Generic) Every Other Day/30 Days Discharge Instructions: Apply Kerlix and Coban compression as directed. Compression Wrap: Coban  dial antibacterial soap and water prior to dressing change. Peri-Wound Care: Triamcinolone 15 (g) Every Other Day/30 Days Discharge Instructions: Use triamcinolone 15 (g) as directed Peri-Wound Care: Zinc Oxide Ointment 30g tube Every Other Day/30 Days Discharge Instructions: Apply Zinc Oxide to excoriated periwound with each dressing change Peri-Wound Care: Sween Lotion (Moisturizing lotion) Every Other Day/30 Days Discharge Instructions: Apply moisturizing lotion as directed Prim Dressing: PolyMem Silver Non-Adhesive Dressing, 4.25x4.25 in (Generic) Every Other Day/30 Days ary Discharge Instructions: Apply to wound bed as instructed Secondary Dressing: Woven Gauze Sponge, Non-Sterile 4x4 in (Generic) Every Other Day/30 Days Discharge Instructions: Apply over primary dressing as directed. Secondary Dressing: ABD Pad, 5x9 (Generic) Every Other Day/30 Days Discharge Instructions: Apply over primary dressing as directed. Com pression Wrap: Kerlix Roll 4.5x3.1 (in/yd) (Generic) Every Other Day/30 Days Discharge Instructions: Apply Kerlix and Coban compression as directed. Com pression Wrap: Coban Self-Adherent Wrap 4x5 (in/yd) (Generic) Every Other Day/30 Days Discharge Instructions: Apply over Kerlix as directed. WOUND #42: - Lower Leg Wound Laterality: Right, Anterior, Proximal Cleanser: Wound Cleanser (Generic) Every Other Day/30 Days Discharge Instructions: Cleanse the wound with wound cleanser prior to applying a clean dressing using gauze sponges, not tissue or cotton balls. Cleanser: Soap and  Water Every Other Day/30 Days Discharge Instructions: May shower and wash wound with dial antibacterial soap and water prior to dressing change. Peri-Wound Care: Triamcinolone 15 (g) Every Other Day/30 Days Discharge Instructions: Use triamcinolone 15 (g) as directed Peri-Wound Care: Sween Lotion (Moisturizing lotion) Every Other Day/30 Days Discharge Instructions: Apply moisturizing lotion as directed Prim Dressing: PolyMem Silver Non-Adhesive Dressing, 4.25x4.25 in Every Other Day/30 Days ary Discharge Instructions: Apply to wound bed as instructed Secondary Dressing: Woven Gauze Sponge, Non-Sterile 4x4 in (Generic) Every Other Day/30 Days Discharge Instructions: Apply over primary dressing as directed. Com pression Wrap: Kerlix Roll 4.5x3.1 (in/yd) (Generic) Every Other Day/30 Days Discharge Instructions: Apply Kerlix and Coban compression as directed. Com pression Wrap: Coban Self-Adherent Wrap 4x5 (in/yd) (Generic) Every Other Day/30 Days Discharge Instructions: Apply over Kerlix as directed. WOUND #43: - Knee Wound Laterality: Right Prim Dressing: PolyMem Silver Non-Adhesive Dressing, 4.25x4.25 in Every Other Day/30 Days ary Discharge Instructions: Apply to wound bed as instructed Secondary Dressing: Zetuvit Plus Silicone Border Dressing 5x5 (in/in) Every Other Day/30 Days Discharge Instructions: Apply silicone border over primary dressing as directed. 1. TCA with zinc oxide and PolyMem silver under Kerlix/Coban 2. PolyMem silver to the remaining wounds 3. Follow-up next week with Dr. Dellia Nims Electronic Signature(s) Signed: 03/06/2021 10:02:51 AM By: Kalman Shan DO Entered By: Kalman Shan on 03/06/2021 10:02:01 -------------------------------------------------------------------------------- HxROS Details Patient Name: Date of Service: [name] Foley F. 03/06/2021 8:00 A M Medical Record Number: [id number] Patient Account Number: [id number] Date of  Birth/Sex: Treating RN: [birth year] (75 y.o. [name] [name] Primary Care Provider: [name], [name] Other Clinician: Referring Provider: Treating Provider/Extender: Mariame Rybolt [name], [name] Weeks in Treatment: 254 Information Obtained From Patient Constitutional Symptoms (General Health) Medical History: Past Medical History Notes: acute encephalopathy Eyes Medical History: Negative for: Cataracts; Glaucoma; Optic Neuritis Ear/Nose/Mouth/Throat Medical History: Negative for: Chronic sinus problems/congestion; Middle ear problems Hematologic/Lymphatic Medical History: Positive for: Anemia Negative for: Hemophilia; Human Immunodeficiency Virus; Lymphedema; Sickle Cell Disease Respiratory Medical History: Positive for: Sleep Apnea - unable to tolerate devices Negative for: Aspiration; Asthma; Chronic Obstructive Pulmonary Disease (COPD); Pneumothorax; Tuberculosis Past Medical History Notes: acute hypoxic resp. failure due to aspiration pneumonia Cardiovascular Medical History: Positive for: Arrhythmia - defib and pacer inplanted; Congestive Heart Failure -  dial antibacterial soap and water prior to dressing change. Peri-Wound Care: Triamcinolone 15 (g) Every Other Day/30 Days Discharge Instructions: Use triamcinolone 15 (g) as directed Peri-Wound Care: Zinc Oxide Ointment 30g tube Every Other Day/30 Days Discharge Instructions: Apply Zinc Oxide to excoriated periwound with each dressing change Peri-Wound Care: Sween Lotion (Moisturizing lotion) Every Other Day/30 Days Discharge Instructions: Apply moisturizing lotion as directed Prim Dressing: PolyMem Silver Non-Adhesive Dressing, 4.25x4.25 in (Generic) Every Other Day/30 Days ary Discharge Instructions: Apply to wound bed as instructed Secondary Dressing: Woven Gauze Sponge, Non-Sterile 4x4 in (Generic) Every Other Day/30 Days Discharge Instructions: Apply over primary dressing as directed. Secondary Dressing: ABD Pad, 5x9 (Generic) Every Other Day/30 Days Discharge Instructions: Apply over primary dressing as directed. Com pression Wrap: Kerlix Roll 4.5x3.1 (in/yd) (Generic) Every Other Day/30 Days Discharge Instructions: Apply Kerlix and Coban compression as directed. Com pression Wrap: Coban Self-Adherent Wrap 4x5 (in/yd) (Generic) Every Other Day/30 Days Discharge Instructions: Apply over Kerlix as directed. WOUND #42: - Lower Leg Wound Laterality: Right, Anterior, Proximal Cleanser: Wound Cleanser (Generic) Every Other Day/30 Days Discharge Instructions: Cleanse the wound with wound cleanser prior to applying a clean dressing using gauze sponges, not tissue or cotton balls. Cleanser: Soap and  Water Every Other Day/30 Days Discharge Instructions: May shower and wash wound with dial antibacterial soap and water prior to dressing change. Peri-Wound Care: Triamcinolone 15 (g) Every Other Day/30 Days Discharge Instructions: Use triamcinolone 15 (g) as directed Peri-Wound Care: Sween Lotion (Moisturizing lotion) Every Other Day/30 Days Discharge Instructions: Apply moisturizing lotion as directed Prim Dressing: PolyMem Silver Non-Adhesive Dressing, 4.25x4.25 in Every Other Day/30 Days ary Discharge Instructions: Apply to wound bed as instructed Secondary Dressing: Woven Gauze Sponge, Non-Sterile 4x4 in (Generic) Every Other Day/30 Days Discharge Instructions: Apply over primary dressing as directed. Com pression Wrap: Kerlix Roll 4.5x3.1 (in/yd) (Generic) Every Other Day/30 Days Discharge Instructions: Apply Kerlix and Coban compression as directed. Com pression Wrap: Coban Self-Adherent Wrap 4x5 (in/yd) (Generic) Every Other Day/30 Days Discharge Instructions: Apply over Kerlix as directed. WOUND #43: - Knee Wound Laterality: Right Prim Dressing: PolyMem Silver Non-Adhesive Dressing, 4.25x4.25 in Every Other Day/30 Days ary Discharge Instructions: Apply to wound bed as instructed Secondary Dressing: Zetuvit Plus Silicone Border Dressing 5x5 (in/in) Every Other Day/30 Days Discharge Instructions: Apply silicone border over primary dressing as directed. 1. TCA with zinc oxide and PolyMem silver under Kerlix/Coban 2. PolyMem silver to the remaining wounds 3. Follow-up next week with Dr. Dellia Nims Electronic Signature(s) Signed: 03/06/2021 10:02:51 AM By: Kalman Shan DO Entered By: Kalman Shan on 03/06/2021 10:02:01 -------------------------------------------------------------------------------- HxROS Details Patient Name: Date of Service: [name] Foley F. 03/06/2021 8:00 A M Medical Record Number: [id number] Patient Account Number: [id number] Date of  Birth/Sex: Treating RN: [birth year] (75 y.o. [name] [name] Primary Care Provider: [name], [name] Other Clinician: Referring Provider: Treating Provider/Extender: Mariame Rybolt [name], [name] Weeks in Treatment: 254 Information Obtained From Patient Constitutional Symptoms (General Health) Medical History: Past Medical History Notes: acute encephalopathy Eyes Medical History: Negative for: Cataracts; Glaucoma; Optic Neuritis Ear/Nose/Mouth/Throat Medical History: Negative for: Chronic sinus problems/congestion; Middle ear problems Hematologic/Lymphatic Medical History: Positive for: Anemia Negative for: Hemophilia; Human Immunodeficiency Virus; Lymphedema; Sickle Cell Disease Respiratory Medical History: Positive for: Sleep Apnea - unable to tolerate devices Negative for: Aspiration; Asthma; Chronic Obstructive Pulmonary Disease (COPD); Pneumothorax; Tuberculosis Past Medical History Notes: acute hypoxic resp. failure due to aspiration pneumonia Cardiovascular Medical History: Positive for: Arrhythmia - defib and pacer inplanted; Congestive Heart Failure -  combined; Coronary Artery Disease - dx'd with; Hypertension - on meds; Myocardial Infarction - 2005 , large NSTEMI 10/2015 Negative for: Angina; Deep Vein Thrombosis; Hypotension; Peripheral Arterial Disease; Peripheral Venous Disease; Phlebitis; Vasculitis Past Medical History Notes: ischemin cardiomyopathy , hyperlipidemia , possible new CVA - known CVA in 60 ; EJECTION FRACTINO -15% Gastrointestinal Medical History: Negative for: Cirrhosis ; Colitis; Crohns; Hepatitis A; Hepatitis B; Hepatitis C Endocrine Medical History: Positive for: Type II Diabetes - on insulin Negative for: Type I Diabetes Past Medical History Notes: uncontrolled -A1C on 10/2015 - 12.0 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 Time with diabetes: 12 years Treated with: Insulin Blood sugar tested every day: Yes T ested : 3x/day Blood sugar  testing results: Breakfast: insulin; Lunch: insulin; Dinner: insulin; Bedtime: insulin Genitourinary Medical History: Negative for: End Stage Renal Disease Past Medical History Notes: acute on chronic kidney disease Stage 3 , recent Klebisella UTI , Immunological Medical History: Negative for: Lupus Erythematosus; Raynauds; Scleroderma Musculoskeletal Medical History: Positive for: Gout - sporadic Negative for: Rheumatoid Arthritis; Osteoarthritis; Osteomyelitis Neurologic Medical History: Positive for: Neuropathy - bilat lower ext Negative for: Dementia; Quadriplegia; Paraplegia; Seizure Disorder Oncologic Medical History: Negative for: Received Chemotherapy; Received Radiation Psychiatric Medical History: Negative for: Anorexia/bulimia; Confinement Anxiety Immunizations Pneumococcal Vaccine: Received Pneumococcal Vaccination: No Immunization Notes: 8 years ago Implantable Devices No devices added Hospitalization / Surgery History Type of Hospitalization/Surgery kidney infection kidney infection/urinary retention left AKA 10/10/2020 Family and Social History Cancer: No; Diabetes: No; Heart Disease: Yes - Maternal Grandparents,Paternal Grandparents,Mother,Father; Hereditary Spherocytosis: No; Hypertension: Yes - Mother,Maternal Grandparents; Kidney Disease: No; Lung Disease: Yes - Mother; Seizures: No; Stroke: No; Thyroid Problems: No; Tuberculosis: No; Never smoker; Marital Status - Married; Alcohol Use: Never; Drug Use: No History; Caffeine Use: Never; Financial Concerns: No; Food, Clothing or Shelter Needs: No; Support System Lacking: No; Transportation Concerns: No Electronic Signature(s) Signed: 03/06/2021 10:02:51 AM By: Kalman Shan DO Signed: 03/06/2021 4:35:28 PM By: Lorrin Jackson Entered By: Kalman Shan on 03/06/2021 09:55:27 -------------------------------------------------------------------------------- SuperBill Details Patient Name: Date of  Service: [name] Foley F. 03/06/2021 Medical Record Number: [id number] Patient Account Number: [id number] Date of Birth/Sex: Treating RN: 10-06-1945 (75 y.o. [name] [name] Primary Care Provider: [name], [name] Other Clinician: Referring Provider: Treating Provider/Extender: Angeleah Labrake [name], [name] Weeks in Treatment: 254 Diagnosis Coding ICD-10 Codes Code Description E11.621 Type 2 diabetes mellitus with foot ulcer L97.518 Non-pressure chronic ulcer of other part of right foot with other specified severity L97.218 Non-pressure chronic ulcer of right calf with other specified severity S80.211D Abrasion, right knee, subsequent encounter L97.418 Non-pressure chronic ulcer of right heel and midfoot with other specified severity S80.211D Abrasion, right knee, subsequent encounter Facility Procedures CPT4 Code: [id number] Description: [phone] - WOUND CARE VISIT-LEV 5 EST PT Modifier: Quantity: 1 Physician Procedures : CPT4 Code Description Modifier [id number] - WC PHYS LEVEL 3 - EST PT ICD-10 Diagnosis Description E11.621 Type 2 diabetes mellitus with foot ulcer L97.518 Non-pressure chronic ulcer of other part of right foot with other specified severity S80.211D  Abrasion, right knee, subsequent encounter I45.809 Non-pressure chronic ulcer of right calf with other specified severity Quantity: 1 Electronic Signature(s) Signed: 03/06/2021 12:16:26 PM By: Kalman Shan DO Signed: 03/06/2021 4:40:13 PM By: Baruch Gouty RN, BSN Previous Signature: 03/06/2021 10:02:51 AM Version By: Kalman Shan DO Entered By: Baruch Gouty on 03/06/2021 11:32:18  dial antibacterial soap and water prior to dressing change. Peri-Wound Care: Triamcinolone 15 (g) Every Other Day/30 Days Discharge Instructions: Use triamcinolone 15 (g) as directed Peri-Wound Care: Zinc Oxide Ointment 30g tube Every Other Day/30 Days Discharge Instructions: Apply Zinc Oxide to excoriated periwound with each dressing change Peri-Wound Care: Sween Lotion (Moisturizing lotion) Every Other Day/30 Days Discharge Instructions: Apply moisturizing lotion as directed Prim Dressing: PolyMem Silver Non-Adhesive Dressing, 4.25x4.25 in (Generic) Every Other Day/30 Days ary Discharge Instructions: Apply to wound bed as instructed Secondary Dressing: Woven Gauze Sponge, Non-Sterile 4x4 in (Generic) Every Other Day/30 Days Discharge Instructions: Apply over primary dressing as directed. Secondary Dressing: ABD Pad, 5x9 (Generic) Every Other Day/30 Days Discharge Instructions: Apply over primary dressing as directed. Com pression Wrap: Kerlix Roll 4.5x3.1 (in/yd) (Generic) Every Other Day/30 Days Discharge Instructions: Apply Kerlix and Coban compression as directed. Com pression Wrap: Coban Self-Adherent Wrap 4x5 (in/yd) (Generic) Every Other Day/30 Days Discharge Instructions: Apply over Kerlix as directed. WOUND #42: - Lower Leg Wound Laterality: Right, Anterior, Proximal Cleanser: Wound Cleanser (Generic) Every Other Day/30 Days Discharge Instructions: Cleanse the wound with wound cleanser prior to applying a clean dressing using gauze sponges, not tissue or cotton balls. Cleanser: Soap and  Water Every Other Day/30 Days Discharge Instructions: May shower and wash wound with dial antibacterial soap and water prior to dressing change. Peri-Wound Care: Triamcinolone 15 (g) Every Other Day/30 Days Discharge Instructions: Use triamcinolone 15 (g) as directed Peri-Wound Care: Sween Lotion (Moisturizing lotion) Every Other Day/30 Days Discharge Instructions: Apply moisturizing lotion as directed Prim Dressing: PolyMem Silver Non-Adhesive Dressing, 4.25x4.25 in Every Other Day/30 Days ary Discharge Instructions: Apply to wound bed as instructed Secondary Dressing: Woven Gauze Sponge, Non-Sterile 4x4 in (Generic) Every Other Day/30 Days Discharge Instructions: Apply over primary dressing as directed. Com pression Wrap: Kerlix Roll 4.5x3.1 (in/yd) (Generic) Every Other Day/30 Days Discharge Instructions: Apply Kerlix and Coban compression as directed. Com pression Wrap: Coban Self-Adherent Wrap 4x5 (in/yd) (Generic) Every Other Day/30 Days Discharge Instructions: Apply over Kerlix as directed. WOUND #43: - Knee Wound Laterality: Right Prim Dressing: PolyMem Silver Non-Adhesive Dressing, 4.25x4.25 in Every Other Day/30 Days ary Discharge Instructions: Apply to wound bed as instructed Secondary Dressing: Zetuvit Plus Silicone Border Dressing 5x5 (in/in) Every Other Day/30 Days Discharge Instructions: Apply silicone border over primary dressing as directed. 1. TCA with zinc oxide and PolyMem silver under Kerlix/Coban 2. PolyMem silver to the remaining wounds 3. Follow-up next week with Dr. Dellia Nims Electronic Signature(s) Signed: 03/06/2021 10:02:51 AM By: Kalman Shan DO Entered By: Kalman Shan on 03/06/2021 10:02:01 -------------------------------------------------------------------------------- HxROS Details Patient Name: Date of Service: [name] Foley F. 03/06/2021 8:00 A M Medical Record Number: [id number] Patient Account Number: [id number] Date of  Birth/Sex: Treating RN: [birth year] (75 y.o. [name] [name] Primary Care Provider: [name], [name] Other Clinician: Referring Provider: Treating Provider/Extender: Mariame Rybolt [name], [name] Weeks in Treatment: 254 Information Obtained From Patient Constitutional Symptoms (General Health) Medical History: Past Medical History Notes: acute encephalopathy Eyes Medical History: Negative for: Cataracts; Glaucoma; Optic Neuritis Ear/Nose/Mouth/Throat Medical History: Negative for: Chronic sinus problems/congestion; Middle ear problems Hematologic/Lymphatic Medical History: Positive for: Anemia Negative for: Hemophilia; Human Immunodeficiency Virus; Lymphedema; Sickle Cell Disease Respiratory Medical History: Positive for: Sleep Apnea - unable to tolerate devices Negative for: Aspiration; Asthma; Chronic Obstructive Pulmonary Disease (COPD); Pneumothorax; Tuberculosis Past Medical History Notes: acute hypoxic resp. failure due to aspiration pneumonia Cardiovascular Medical History: Positive for: Arrhythmia - defib and pacer inplanted; Congestive Heart Failure -  upper thigh popliteal and posterior tibial artery. TBI was 0.76 great toe pressure was 100. They did not think that there was significant PAD. He had already had a right popliteal artery and right posterior tibial artery angioplasty. Follow-up in 6 months We have been using polymen. 11/14; patient presents for evaluation of increased weeping to his right lower extremity. He states that last week he had increased serous drainage but this has since resolved after 1 day. He has no issues or complaints today. He denies signs of infection. Electronic Signature(s) Signed: 03/06/2021 10:02:51 AM By: Kalman Shan DO Entered By: Kalman Shan on 03/06/2021 09:55:15 -------------------------------------------------------------------------------- Physical Exam Details Patient Name: Date of Service: [name] Foley F. 03/06/2021 8:00 A M Medical Record Number: [id number] Patient Account Number: [id number] Date of Birth/Sex: Treating RN: [birth year] (75 y.o. [name] [name] Primary Care Provider: [name], [name] Other Clinician: Referring Provider: Treating Provider/Extender: Hiroshi Krummel [name], [name] Weeks in Treatment: 254 Constitutional respirations regular, non-labored and within target range for patient.Marland Kitchen Psychiatric pleasant and cooperative. Notes Right dorsal foot: Tunnel that probes to bone. Right heel: Open wound with granulation tissue nonviable tissue present. Areas of skin breakdown to the right lateral lower leg. Mild serous drainage on exam. No obvious signs of infection. Electronic Signature(s) Signed: 03/06/2021 10:02:51 AM By: Kalman Shan DO Entered By: Kalman Shan on 03/06/2021  09:57:10 -------------------------------------------------------------------------------- Physician Orders Details Patient Name: Date of Service: [name] Foley F. 03/06/2021 8:00 A M Medical Record Number: [id number] Patient Account Number: [id number] Date of Birth/Sex: Treating RN: [birth year] (75 y.o. [name] [name] Primary Care Provider: [name], [name] Other Clinician: Referring Provider: Treating Provider/Extender: Kazuto Sevey [name], [name] Weeks in Treatment: [phone] Verbal / Phone Orders: No Diagnosis Coding Follow-up Appointments ppointment in 1 week. - Dr. Dellia Nims - Tuesday Return A Bathing/ Shower/ Hygiene May shower with protection but do not get wound dressing(s) wet. Edema Control - Lymphedema / SCD / Other Elevate legs to the level of the heart or above for 30 minutes daily and/or when sitting, a frequency of: - throughout the day Avoid standing for long periods of time. Off-Loading Other: - multipodus boots to both feet, elevate heel off the bed with pillows under calf, avoid turning leg out to the side for long periods of time Wound Treatment Wound #25 - Lower Leg Wound Laterality: Right, Lateral Cleanser: Wound Cleanser (Generic) Every Other Day/30 Days Discharge Instructions: Cleanse the wound with wound cleanser prior to applying a clean dressing using gauze sponges, not tissue or cotton balls. Cleanser: Soap and Water Every Other Day/30 Days Discharge Instructions: May shower and wash wound with dial antibacterial soap and water prior to dressing change. Peri-Wound Care: Triamcinolone 15 (g) Every Other Day/30 Days Discharge Instructions: Use triamcinolone 15 (g) as directed Peri-Wound Care: Zinc Oxide Ointment 30g tube Every Other Day/30 Days Discharge Instructions: Apply Zinc Oxide to excoriated periwound with each dressing change Peri-Wound Care: Sween Lotion (Moisturizing lotion) Every Other Day/30 Days Discharge Instructions: Apply moisturizing lotion  as directed Prim Dressing: PolyMem Silver Non-Adhesive Dressing, 4.25x4.25 in (Generic) Every Other Day/30 Days ary Discharge Instructions: Apply to wound bed as instructed Secondary Dressing: Woven Gauze Sponge, Non-Sterile 4x4 in (Generic) Every Other Day/30 Days Discharge Instructions: Apply over primary dressing as directed. Secondary Dressing: ABD Pad, 5x9 (Generic) Every Other Day/30 Days Discharge Instructions: Apply over primary dressing as directed. Compression Wrap: Kerlix Roll 4.5x3.1 (in/yd) (Generic) Every Other Day/30 Days Discharge Instructions: Apply Kerlix and Coban compression as directed. Compression Wrap: Coban  upper thigh popliteal and posterior tibial artery. TBI was 0.76 great toe pressure was 100. They did not think that there was significant PAD. He had already had a right popliteal artery and right posterior tibial artery angioplasty. Follow-up in 6 months We have been using polymen. 11/14; patient presents for evaluation of increased weeping to his right lower extremity. He states that last week he had increased serous drainage but this has since resolved after 1 day. He has no issues or complaints today. He denies signs of infection. Electronic Signature(s) Signed: 03/06/2021 10:02:51 AM By: Kalman Shan DO Entered By: Kalman Shan on 03/06/2021 09:55:15 -------------------------------------------------------------------------------- Physical Exam Details Patient Name: Date of Service: [name] Foley F. 03/06/2021 8:00 A M Medical Record Number: [id number] Patient Account Number: [id number] Date of Birth/Sex: Treating RN: [birth year] (75 y.o. [name] [name] Primary Care Provider: [name], [name] Other Clinician: Referring Provider: Treating Provider/Extender: Hiroshi Krummel [name], [name] Weeks in Treatment: 254 Constitutional respirations regular, non-labored and within target range for patient.Marland Kitchen Psychiatric pleasant and cooperative. Notes Right dorsal foot: Tunnel that probes to bone. Right heel: Open wound with granulation tissue nonviable tissue present. Areas of skin breakdown to the right lateral lower leg. Mild serous drainage on exam. No obvious signs of infection. Electronic Signature(s) Signed: 03/06/2021 10:02:51 AM By: Kalman Shan DO Entered By: Kalman Shan on 03/06/2021  09:57:10 -------------------------------------------------------------------------------- Physician Orders Details Patient Name: Date of Service: [name] Foley F. 03/06/2021 8:00 A M Medical Record Number: [id number] Patient Account Number: [id number] Date of Birth/Sex: Treating RN: [birth year] (75 y.o. [name] [name] Primary Care Provider: [name], [name] Other Clinician: Referring Provider: Treating Provider/Extender: Kazuto Sevey [name], [name] Weeks in Treatment: [phone] Verbal / Phone Orders: No Diagnosis Coding Follow-up Appointments ppointment in 1 week. - Dr. Dellia Nims - Tuesday Return A Bathing/ Shower/ Hygiene May shower with protection but do not get wound dressing(s) wet. Edema Control - Lymphedema / SCD / Other Elevate legs to the level of the heart or above for 30 minutes daily and/or when sitting, a frequency of: - throughout the day Avoid standing for long periods of time. Off-Loading Other: - multipodus boots to both feet, elevate heel off the bed with pillows under calf, avoid turning leg out to the side for long periods of time Wound Treatment Wound #25 - Lower Leg Wound Laterality: Right, Lateral Cleanser: Wound Cleanser (Generic) Every Other Day/30 Days Discharge Instructions: Cleanse the wound with wound cleanser prior to applying a clean dressing using gauze sponges, not tissue or cotton balls. Cleanser: Soap and Water Every Other Day/30 Days Discharge Instructions: May shower and wash wound with dial antibacterial soap and water prior to dressing change. Peri-Wound Care: Triamcinolone 15 (g) Every Other Day/30 Days Discharge Instructions: Use triamcinolone 15 (g) as directed Peri-Wound Care: Zinc Oxide Ointment 30g tube Every Other Day/30 Days Discharge Instructions: Apply Zinc Oxide to excoriated periwound with each dressing change Peri-Wound Care: Sween Lotion (Moisturizing lotion) Every Other Day/30 Days Discharge Instructions: Apply moisturizing lotion  as directed Prim Dressing: PolyMem Silver Non-Adhesive Dressing, 4.25x4.25 in (Generic) Every Other Day/30 Days ary Discharge Instructions: Apply to wound bed as instructed Secondary Dressing: Woven Gauze Sponge, Non-Sterile 4x4 in (Generic) Every Other Day/30 Days Discharge Instructions: Apply over primary dressing as directed. Secondary Dressing: ABD Pad, 5x9 (Generic) Every Other Day/30 Days Discharge Instructions: Apply over primary dressing as directed. Compression Wrap: Kerlix Roll 4.5x3.1 (in/yd) (Generic) Every Other Day/30 Days Discharge Instructions: Apply Kerlix and Coban compression as directed. Compression Wrap: Coban  upper thigh popliteal and posterior tibial artery. TBI was 0.76 great toe pressure was 100. They did not think that there was significant PAD. He had already had a right popliteal artery and right posterior tibial artery angioplasty. Follow-up in 6 months We have been using polymen. 11/14; patient presents for evaluation of increased weeping to his right lower extremity. He states that last week he had increased serous drainage but this has since resolved after 1 day. He has no issues or complaints today. He denies signs of infection. Electronic Signature(s) Signed: 03/06/2021 10:02:51 AM By: Kalman Shan DO Entered By: Kalman Shan on 03/06/2021 09:55:15 -------------------------------------------------------------------------------- Physical Exam Details Patient Name: Date of Service: [name] Foley F. 03/06/2021 8:00 A M Medical Record Number: [id number] Patient Account Number: [id number] Date of Birth/Sex: Treating RN: [birth year] (75 y.o. [name] [name] Primary Care Provider: [name], [name] Other Clinician: Referring Provider: Treating Provider/Extender: Hiroshi Krummel [name], [name] Weeks in Treatment: 254 Constitutional respirations regular, non-labored and within target range for patient.Marland Kitchen Psychiatric pleasant and cooperative. Notes Right dorsal foot: Tunnel that probes to bone. Right heel: Open wound with granulation tissue nonviable tissue present. Areas of skin breakdown to the right lateral lower leg. Mild serous drainage on exam. No obvious signs of infection. Electronic Signature(s) Signed: 03/06/2021 10:02:51 AM By: Kalman Shan DO Entered By: Kalman Shan on 03/06/2021  09:57:10 -------------------------------------------------------------------------------- Physician Orders Details Patient Name: Date of Service: [name] Foley F. 03/06/2021 8:00 A M Medical Record Number: [id number] Patient Account Number: [id number] Date of Birth/Sex: Treating RN: [birth year] (75 y.o. [name] [name] Primary Care Provider: [name], [name] Other Clinician: Referring Provider: Treating Provider/Extender: Kazuto Sevey [name], [name] Weeks in Treatment: [phone] Verbal / Phone Orders: No Diagnosis Coding Follow-up Appointments ppointment in 1 week. - Dr. Dellia Nims - Tuesday Return A Bathing/ Shower/ Hygiene May shower with protection but do not get wound dressing(s) wet. Edema Control - Lymphedema / SCD / Other Elevate legs to the level of the heart or above for 30 minutes daily and/or when sitting, a frequency of: - throughout the day Avoid standing for long periods of time. Off-Loading Other: - multipodus boots to both feet, elevate heel off the bed with pillows under calf, avoid turning leg out to the side for long periods of time Wound Treatment Wound #25 - Lower Leg Wound Laterality: Right, Lateral Cleanser: Wound Cleanser (Generic) Every Other Day/30 Days Discharge Instructions: Cleanse the wound with wound cleanser prior to applying a clean dressing using gauze sponges, not tissue or cotton balls. Cleanser: Soap and Water Every Other Day/30 Days Discharge Instructions: May shower and wash wound with dial antibacterial soap and water prior to dressing change. Peri-Wound Care: Triamcinolone 15 (g) Every Other Day/30 Days Discharge Instructions: Use triamcinolone 15 (g) as directed Peri-Wound Care: Zinc Oxide Ointment 30g tube Every Other Day/30 Days Discharge Instructions: Apply Zinc Oxide to excoriated periwound with each dressing change Peri-Wound Care: Sween Lotion (Moisturizing lotion) Every Other Day/30 Days Discharge Instructions: Apply moisturizing lotion  as directed Prim Dressing: PolyMem Silver Non-Adhesive Dressing, 4.25x4.25 in (Generic) Every Other Day/30 Days ary Discharge Instructions: Apply to wound bed as instructed Secondary Dressing: Woven Gauze Sponge, Non-Sterile 4x4 in (Generic) Every Other Day/30 Days Discharge Instructions: Apply over primary dressing as directed. Secondary Dressing: ABD Pad, 5x9 (Generic) Every Other Day/30 Days Discharge Instructions: Apply over primary dressing as directed. Compression Wrap: Kerlix Roll 4.5x3.1 (in/yd) (Generic) Every Other Day/30 Days Discharge Instructions: Apply Kerlix and Coban compression as directed. Compression Wrap: Coban  combined; Coronary Artery Disease - dx'd with; Hypertension - on meds; Myocardial Infarction - 2005 , large NSTEMI 10/2015 Negative for: Angina; Deep Vein Thrombosis; Hypotension; Peripheral Arterial Disease; Peripheral Venous Disease; Phlebitis; Vasculitis Past Medical History Notes: ischemin cardiomyopathy , hyperlipidemia , possible new CVA - known CVA in 60 ; EJECTION FRACTINO -15% Gastrointestinal Medical History: Negative for: Cirrhosis ; Colitis; Crohns; Hepatitis A; Hepatitis B; Hepatitis C Endocrine Medical History: Positive for: Type II Diabetes - on insulin Negative for: Type I Diabetes Past Medical History Notes: uncontrolled -A1C on 10/2015 - 12.0 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 Time with diabetes: 12 years Treated with: Insulin Blood sugar tested every day: Yes T ested : 3x/day Blood sugar  testing results: Breakfast: insulin; Lunch: insulin; Dinner: insulin; Bedtime: insulin Genitourinary Medical History: Negative for: End Stage Renal Disease Past Medical History Notes: acute on chronic kidney disease Stage 3 , recent Klebisella UTI , Immunological Medical History: Negative for: Lupus Erythematosus; Raynauds; Scleroderma Musculoskeletal Medical History: Positive for: Gout - sporadic Negative for: Rheumatoid Arthritis; Osteoarthritis; Osteomyelitis Neurologic Medical History: Positive for: Neuropathy - bilat lower ext Negative for: Dementia; Quadriplegia; Paraplegia; Seizure Disorder Oncologic Medical History: Negative for: Received Chemotherapy; Received Radiation Psychiatric Medical History: Negative for: Anorexia/bulimia; Confinement Anxiety Immunizations Pneumococcal Vaccine: Received Pneumococcal Vaccination: No Immunization Notes: 8 years ago Implantable Devices No devices added Hospitalization / Surgery History Type of Hospitalization/Surgery kidney infection kidney infection/urinary retention left AKA 10/10/2020 Family and Social History Cancer: No; Diabetes: No; Heart Disease: Yes - Maternal Grandparents,Paternal Grandparents,Mother,Father; Hereditary Spherocytosis: No; Hypertension: Yes - Mother,Maternal Grandparents; Kidney Disease: No; Lung Disease: Yes - Mother; Seizures: No; Stroke: No; Thyroid Problems: No; Tuberculosis: No; Never smoker; Marital Status - Married; Alcohol Use: Never; Drug Use: No History; Caffeine Use: Never; Financial Concerns: No; Food, Clothing or Shelter Needs: No; Support System Lacking: No; Transportation Concerns: No Electronic Signature(s) Signed: 03/06/2021 10:02:51 AM By: Kalman Shan DO Signed: 03/06/2021 4:35:28 PM By: Lorrin Jackson Entered By: Kalman Shan on 03/06/2021 09:55:27 -------------------------------------------------------------------------------- SuperBill Details Patient Name: Date of  Service: [name] Foley F. 03/06/2021 Medical Record Number: [id number] Patient Account Number: [id number] Date of Birth/Sex: Treating RN: 10-06-1945 (75 y.o. [name] [name] Primary Care Provider: [name], [name] Other Clinician: Referring Provider: Treating Provider/Extender: Angeleah Labrake [name], [name] Weeks in Treatment: 254 Diagnosis Coding ICD-10 Codes Code Description E11.621 Type 2 diabetes mellitus with foot ulcer L97.518 Non-pressure chronic ulcer of other part of right foot with other specified severity L97.218 Non-pressure chronic ulcer of right calf with other specified severity S80.211D Abrasion, right knee, subsequent encounter L97.418 Non-pressure chronic ulcer of right heel and midfoot with other specified severity S80.211D Abrasion, right knee, subsequent encounter Facility Procedures CPT4 Code: 36144315 Description: [phone] - WOUND CARE VISIT-LEV 5 EST PT Modifier: Quantity: 1 Physician Procedures : CPT4 Code Description Modifier [id number] - WC PHYS LEVEL 3 - EST PT ICD-10 Diagnosis Description E11.621 Type 2 diabetes mellitus with foot ulcer L97.518 Non-pressure chronic ulcer of other part of right foot with other specified severity S80.211D  Abrasion, right knee, subsequent encounter I45.809 Non-pressure chronic ulcer of right calf with other specified severity Quantity: 1 Electronic Signature(s) Signed: 03/06/2021 12:16:26 PM By: Kalman Shan DO Signed: 03/06/2021 4:40:13 PM By: Baruch Gouty RN, BSN Previous Signature: 03/06/2021 10:02:51 AM Version By: Kalman Shan DO Entered By: Baruch Gouty on 03/06/2021 11:32:18  combined; Coronary Artery Disease - dx'd with; Hypertension - on meds; Myocardial Infarction - 2005 , large NSTEMI 10/2015 Negative for: Angina; Deep Vein Thrombosis; Hypotension; Peripheral Arterial Disease; Peripheral Venous Disease; Phlebitis; Vasculitis Past Medical History Notes: ischemin cardiomyopathy , hyperlipidemia , possible new CVA - known CVA in 60 ; EJECTION FRACTINO -15% Gastrointestinal Medical History: Negative for: Cirrhosis ; Colitis; Crohns; Hepatitis A; Hepatitis B; Hepatitis C Endocrine Medical History: Positive for: Type II Diabetes - on insulin Negative for: Type I Diabetes Past Medical History Notes: uncontrolled -A1C on 10/2015 - 12.0 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 Time with diabetes: 12 years Treated with: Insulin Blood sugar tested every day: Yes T ested : 3x/day Blood sugar  testing results: Breakfast: insulin; Lunch: insulin; Dinner: insulin; Bedtime: insulin Genitourinary Medical History: Negative for: End Stage Renal Disease Past Medical History Notes: acute on chronic kidney disease Stage 3 , recent Klebisella UTI , Immunological Medical History: Negative for: Lupus Erythematosus; Raynauds; Scleroderma Musculoskeletal Medical History: Positive for: Gout - sporadic Negative for: Rheumatoid Arthritis; Osteoarthritis; Osteomyelitis Neurologic Medical History: Positive for: Neuropathy - bilat lower ext Negative for: Dementia; Quadriplegia; Paraplegia; Seizure Disorder Oncologic Medical History: Negative for: Received Chemotherapy; Received Radiation Psychiatric Medical History: Negative for: Anorexia/bulimia; Confinement Anxiety Immunizations Pneumococcal Vaccine: Received Pneumococcal Vaccination: No Immunization Notes: 8 years ago Implantable Devices No devices added Hospitalization / Surgery History Type of Hospitalization/Surgery kidney infection kidney infection/urinary retention left AKA 10/10/2020 Family and Social History Cancer: No; Diabetes: No; Heart Disease: Yes - Maternal Grandparents,Paternal Grandparents,Mother,Father; Hereditary Spherocytosis: No; Hypertension: Yes - Mother,Maternal Grandparents; Kidney Disease: No; Lung Disease: Yes - Mother; Seizures: No; Stroke: No; Thyroid Problems: No; Tuberculosis: No; Never smoker; Marital Status - Married; Alcohol Use: Never; Drug Use: No History; Caffeine Use: Never; Financial Concerns: No; Food, Clothing or Shelter Needs: No; Support System Lacking: No; Transportation Concerns: No Electronic Signature(s) Signed: 03/06/2021 10:02:51 AM By: Kalman Shan DO Signed: 03/06/2021 4:35:28 PM By: Lorrin Jackson Entered By: Kalman Shan on 03/06/2021 09:55:27 -------------------------------------------------------------------------------- SuperBill Details Patient Name: Date of  Service: [name] Foley F. 03/06/2021 Medical Record Number: [id number] Patient Account Number: [id number] Date of Birth/Sex: Treating RN: 10-06-1945 (75 y.o. [name] [name] Primary Care Provider: [name], [name] Other Clinician: Referring Provider: Treating Provider/Extender: Angeleah Labrake [name], [name] Weeks in Treatment: 254 Diagnosis Coding ICD-10 Codes Code Description E11.621 Type 2 diabetes mellitus with foot ulcer L97.518 Non-pressure chronic ulcer of other part of right foot with other specified severity L97.218 Non-pressure chronic ulcer of right calf with other specified severity S80.211D Abrasion, right knee, subsequent encounter L97.418 Non-pressure chronic ulcer of right heel and midfoot with other specified severity S80.211D Abrasion, right knee, subsequent encounter Facility Procedures CPT4 Code: [id number] Description: [phone] - WOUND CARE VISIT-LEV 5 EST PT Modifier: Quantity: 1 Physician Procedures : CPT4 Code Description Modifier [id number] - WC PHYS LEVEL 3 - EST PT ICD-10 Diagnosis Description E11.621 Type 2 diabetes mellitus with foot ulcer L97.518 Non-pressure chronic ulcer of other part of right foot with other specified severity S80.211D  Abrasion, right knee, subsequent encounter I45.809 Non-pressure chronic ulcer of right calf with other specified severity Quantity: 1 Electronic Signature(s) Signed: 03/06/2021 12:16:26 PM By: Kalman Shan DO Signed: 03/06/2021 4:40:13 PM By: Baruch Gouty RN, BSN Previous Signature: 03/06/2021 10:02:51 AM Version By: Kalman Shan DO Entered By: Baruch Gouty on 03/06/2021 11:32:18  dial antibacterial soap and water prior to dressing change. Peri-Wound Care: Triamcinolone 15 (g) Every Other Day/30 Days Discharge Instructions: Use triamcinolone 15 (g) as directed Peri-Wound Care: Zinc Oxide Ointment 30g tube Every Other Day/30 Days Discharge Instructions: Apply Zinc Oxide to excoriated periwound with each dressing change Peri-Wound Care: Sween Lotion (Moisturizing lotion) Every Other Day/30 Days Discharge Instructions: Apply moisturizing lotion as directed Prim Dressing: PolyMem Silver Non-Adhesive Dressing, 4.25x4.25 in (Generic) Every Other Day/30 Days ary Discharge Instructions: Apply to wound bed as instructed Secondary Dressing: Woven Gauze Sponge, Non-Sterile 4x4 in (Generic) Every Other Day/30 Days Discharge Instructions: Apply over primary dressing as directed. Secondary Dressing: ABD Pad, 5x9 (Generic) Every Other Day/30 Days Discharge Instructions: Apply over primary dressing as directed. Com pression Wrap: Kerlix Roll 4.5x3.1 (in/yd) (Generic) Every Other Day/30 Days Discharge Instructions: Apply Kerlix and Coban compression as directed. Com pression Wrap: Coban Self-Adherent Wrap 4x5 (in/yd) (Generic) Every Other Day/30 Days Discharge Instructions: Apply over Kerlix as directed. WOUND #42: - Lower Leg Wound Laterality: Right, Anterior, Proximal Cleanser: Wound Cleanser (Generic) Every Other Day/30 Days Discharge Instructions: Cleanse the wound with wound cleanser prior to applying a clean dressing using gauze sponges, not tissue or cotton balls. Cleanser: Soap and  Water Every Other Day/30 Days Discharge Instructions: May shower and wash wound with dial antibacterial soap and water prior to dressing change. Peri-Wound Care: Triamcinolone 15 (g) Every Other Day/30 Days Discharge Instructions: Use triamcinolone 15 (g) as directed Peri-Wound Care: Sween Lotion (Moisturizing lotion) Every Other Day/30 Days Discharge Instructions: Apply moisturizing lotion as directed Prim Dressing: PolyMem Silver Non-Adhesive Dressing, 4.25x4.25 in Every Other Day/30 Days ary Discharge Instructions: Apply to wound bed as instructed Secondary Dressing: Woven Gauze Sponge, Non-Sterile 4x4 in (Generic) Every Other Day/30 Days Discharge Instructions: Apply over primary dressing as directed. Com pression Wrap: Kerlix Roll 4.5x3.1 (in/yd) (Generic) Every Other Day/30 Days Discharge Instructions: Apply Kerlix and Coban compression as directed. Com pression Wrap: Coban Self-Adherent Wrap 4x5 (in/yd) (Generic) Every Other Day/30 Days Discharge Instructions: Apply over Kerlix as directed. WOUND #43: - Knee Wound Laterality: Right Prim Dressing: PolyMem Silver Non-Adhesive Dressing, 4.25x4.25 in Every Other Day/30 Days ary Discharge Instructions: Apply to wound bed as instructed Secondary Dressing: Zetuvit Plus Silicone Border Dressing 5x5 (in/in) Every Other Day/30 Days Discharge Instructions: Apply silicone border over primary dressing as directed. 1. TCA with zinc oxide and PolyMem silver under Kerlix/Coban 2. PolyMem silver to the remaining wounds 3. Follow-up next week with Dr. Dellia Nims Electronic Signature(s) Signed: 03/06/2021 10:02:51 AM By: Kalman Shan DO Entered By: Kalman Shan on 03/06/2021 10:02:01 -------------------------------------------------------------------------------- HxROS Details Patient Name: Date of Service: [name] Foley F. 03/06/2021 8:00 A M Medical Record Number: [id number] Patient Account Number: [id number] Date of  Birth/Sex: Treating RN: [birth year] (75 y.o. [name] [name] Primary Care Provider: [name], [name] Other Clinician: Referring Provider: Treating Provider/Extender: Mariame Rybolt [name], [name] Weeks in Treatment: 254 Information Obtained From Patient Constitutional Symptoms (General Health) Medical History: Past Medical History Notes: acute encephalopathy Eyes Medical History: Negative for: Cataracts; Glaucoma; Optic Neuritis Ear/Nose/Mouth/Throat Medical History: Negative for: Chronic sinus problems/congestion; Middle ear problems Hematologic/Lymphatic Medical History: Positive for: Anemia Negative for: Hemophilia; Human Immunodeficiency Virus; Lymphedema; Sickle Cell Disease Respiratory Medical History: Positive for: Sleep Apnea - unable to tolerate devices Negative for: Aspiration; Asthma; Chronic Obstructive Pulmonary Disease (COPD); Pneumothorax; Tuberculosis Past Medical History Notes: acute hypoxic resp. failure due to aspiration pneumonia Cardiovascular Medical History: Positive for: Arrhythmia - defib and pacer inplanted; Congestive Heart Failure -  upper thigh popliteal and posterior tibial artery. TBI was 0.76 great toe pressure was 100. They did not think that there was significant PAD. He had already had a right popliteal artery and right posterior tibial artery angioplasty. Follow-up in 6 months We have been using polymen. 11/14; patient presents for evaluation of increased weeping to his right lower extremity. He states that last week he had increased serous drainage but this has since resolved after 1 day. He has no issues or complaints today. He denies signs of infection. Electronic Signature(s) Signed: 03/06/2021 10:02:51 AM By: Kalman Shan DO Entered By: Kalman Shan on 03/06/2021 09:55:15 -------------------------------------------------------------------------------- Physical Exam Details Patient Name: Date of Service: [name] Foley F. 03/06/2021 8:00 A M Medical Record Number: [id number] Patient Account Number: [id number] Date of Birth/Sex: Treating RN: [birth year] (75 y.o. [name] [name] Primary Care Provider: [name], [name] Other Clinician: Referring Provider: Treating Provider/Extender: Hiroshi Krummel [name], [name] Weeks in Treatment: 254 Constitutional respirations regular, non-labored and within target range for patient.Marland Kitchen Psychiatric pleasant and cooperative. Notes Right dorsal foot: Tunnel that probes to bone. Right heel: Open wound with granulation tissue nonviable tissue present. Areas of skin breakdown to the right lateral lower leg. Mild serous drainage on exam. No obvious signs of infection. Electronic Signature(s) Signed: 03/06/2021 10:02:51 AM By: Kalman Shan DO Entered By: Kalman Shan on 03/06/2021  09:57:10 -------------------------------------------------------------------------------- Physician Orders Details Patient Name: Date of Service: [name] Foley F. 03/06/2021 8:00 A M Medical Record Number: [id number] Patient Account Number: [id number] Date of Birth/Sex: Treating RN: [birth year] (75 y.o. [name] [name] Primary Care Provider: [name], [name] Other Clinician: Referring Provider: Treating Provider/Extender: Kazuto Sevey [name], [name] Weeks in Treatment: [phone] Verbal / Phone Orders: No Diagnosis Coding Follow-up Appointments ppointment in 1 week. - Dr. Dellia Nims - Tuesday Return A Bathing/ Shower/ Hygiene May shower with protection but do not get wound dressing(s) wet. Edema Control - Lymphedema / SCD / Other Elevate legs to the level of the heart or above for 30 minutes daily and/or when sitting, a frequency of: - throughout the day Avoid standing for long periods of time. Off-Loading Other: - multipodus boots to both feet, elevate heel off the bed with pillows under calf, avoid turning leg out to the side for long periods of time Wound Treatment Wound #25 - Lower Leg Wound Laterality: Right, Lateral Cleanser: Wound Cleanser (Generic) Every Other Day/30 Days Discharge Instructions: Cleanse the wound with wound cleanser prior to applying a clean dressing using gauze sponges, not tissue or cotton balls. Cleanser: Soap and Water Every Other Day/30 Days Discharge Instructions: May shower and wash wound with dial antibacterial soap and water prior to dressing change. Peri-Wound Care: Triamcinolone 15 (g) Every Other Day/30 Days Discharge Instructions: Use triamcinolone 15 (g) as directed Peri-Wound Care: Zinc Oxide Ointment 30g tube Every Other Day/30 Days Discharge Instructions: Apply Zinc Oxide to excoriated periwound with each dressing change Peri-Wound Care: Sween Lotion (Moisturizing lotion) Every Other Day/30 Days Discharge Instructions: Apply moisturizing lotion  as directed Prim Dressing: PolyMem Silver Non-Adhesive Dressing, 4.25x4.25 in (Generic) Every Other Day/30 Days ary Discharge Instructions: Apply to wound bed as instructed Secondary Dressing: Woven Gauze Sponge, Non-Sterile 4x4 in (Generic) Every Other Day/30 Days Discharge Instructions: Apply over primary dressing as directed. Secondary Dressing: ABD Pad, 5x9 (Generic) Every Other Day/30 Days Discharge Instructions: Apply over primary dressing as directed. Compression Wrap: Kerlix Roll 4.5x3.1 (in/yd) (Generic) Every Other Day/30 Days Discharge Instructions: Apply Kerlix and Coban compression as directed. Compression Wrap: Coban  dial antibacterial soap and water prior to dressing change. Peri-Wound Care: Triamcinolone 15 (g) Every Other Day/30 Days Discharge Instructions: Use triamcinolone 15 (g) as directed Peri-Wound Care: Zinc Oxide Ointment 30g tube Every Other Day/30 Days Discharge Instructions: Apply Zinc Oxide to excoriated periwound with each dressing change Peri-Wound Care: Sween Lotion (Moisturizing lotion) Every Other Day/30 Days Discharge Instructions: Apply moisturizing lotion as directed Prim Dressing: PolyMem Silver Non-Adhesive Dressing, 4.25x4.25 in (Generic) Every Other Day/30 Days ary Discharge Instructions: Apply to wound bed as instructed Secondary Dressing: Woven Gauze Sponge, Non-Sterile 4x4 in (Generic) Every Other Day/30 Days Discharge Instructions: Apply over primary dressing as directed. Secondary Dressing: ABD Pad, 5x9 (Generic) Every Other Day/30 Days Discharge Instructions: Apply over primary dressing as directed. Com pression Wrap: Kerlix Roll 4.5x3.1 (in/yd) (Generic) Every Other Day/30 Days Discharge Instructions: Apply Kerlix and Coban compression as directed. Com pression Wrap: Coban Self-Adherent Wrap 4x5 (in/yd) (Generic) Every Other Day/30 Days Discharge Instructions: Apply over Kerlix as directed. WOUND #42: - Lower Leg Wound Laterality: Right, Anterior, Proximal Cleanser: Wound Cleanser (Generic) Every Other Day/30 Days Discharge Instructions: Cleanse the wound with wound cleanser prior to applying a clean dressing using gauze sponges, not tissue or cotton balls. Cleanser: Soap and  Water Every Other Day/30 Days Discharge Instructions: May shower and wash wound with dial antibacterial soap and water prior to dressing change. Peri-Wound Care: Triamcinolone 15 (g) Every Other Day/30 Days Discharge Instructions: Use triamcinolone 15 (g) as directed Peri-Wound Care: Sween Lotion (Moisturizing lotion) Every Other Day/30 Days Discharge Instructions: Apply moisturizing lotion as directed Prim Dressing: PolyMem Silver Non-Adhesive Dressing, 4.25x4.25 in Every Other Day/30 Days ary Discharge Instructions: Apply to wound bed as instructed Secondary Dressing: Woven Gauze Sponge, Non-Sterile 4x4 in (Generic) Every Other Day/30 Days Discharge Instructions: Apply over primary dressing as directed. Com pression Wrap: Kerlix Roll 4.5x3.1 (in/yd) (Generic) Every Other Day/30 Days Discharge Instructions: Apply Kerlix and Coban compression as directed. Com pression Wrap: Coban Self-Adherent Wrap 4x5 (in/yd) (Generic) Every Other Day/30 Days Discharge Instructions: Apply over Kerlix as directed. WOUND #43: - Knee Wound Laterality: Right Prim Dressing: PolyMem Silver Non-Adhesive Dressing, 4.25x4.25 in Every Other Day/30 Days ary Discharge Instructions: Apply to wound bed as instructed Secondary Dressing: Zetuvit Plus Silicone Border Dressing 5x5 (in/in) Every Other Day/30 Days Discharge Instructions: Apply silicone border over primary dressing as directed. 1. TCA with zinc oxide and PolyMem silver under Kerlix/Coban 2. PolyMem silver to the remaining wounds 3. Follow-up next week with Dr. Dellia Nims Electronic Signature(s) Signed: 03/06/2021 10:02:51 AM By: Kalman Shan DO Entered By: Kalman Shan on 03/06/2021 10:02:01 -------------------------------------------------------------------------------- HxROS Details Patient Name: Date of Service: [name] Foley F. 03/06/2021 8:00 A M Medical Record Number: [id number] Patient Account Number: [id number] Date of  Birth/Sex: Treating RN: [birth year] (75 y.o. [name] [name] Primary Care Provider: [name], [name] Other Clinician: Referring Provider: Treating Provider/Extender: Mariame Rybolt [name], [name] Weeks in Treatment: 254 Information Obtained From Patient Constitutional Symptoms (General Health) Medical History: Past Medical History Notes: acute encephalopathy Eyes Medical History: Negative for: Cataracts; Glaucoma; Optic Neuritis Ear/Nose/Mouth/Throat Medical History: Negative for: Chronic sinus problems/congestion; Middle ear problems Hematologic/Lymphatic Medical History: Positive for: Anemia Negative for: Hemophilia; Human Immunodeficiency Virus; Lymphedema; Sickle Cell Disease Respiratory Medical History: Positive for: Sleep Apnea - unable to tolerate devices Negative for: Aspiration; Asthma; Chronic Obstructive Pulmonary Disease (COPD); Pneumothorax; Tuberculosis Past Medical History Notes: acute hypoxic resp. failure due to aspiration pneumonia Cardiovascular Medical History: Positive for: Arrhythmia - defib and pacer inplanted; Congestive Heart Failure -  upper thigh popliteal and posterior tibial artery. TBI was 0.76 great toe pressure was 100. They did not think that there was significant PAD. He had already had a right popliteal artery and right posterior tibial artery angioplasty. Follow-up in 6 months We have been using polymen. 11/14; patient presents for evaluation of increased weeping to his right lower extremity. He states that last week he had increased serous drainage but this has since resolved after 1 day. He has no issues or complaints today. He denies signs of infection. Electronic Signature(s) Signed: 03/06/2021 10:02:51 AM By: Kalman Shan DO Entered By: Kalman Shan on 03/06/2021 09:55:15 -------------------------------------------------------------------------------- Physical Exam Details Patient Name: Date of Service: [name] Foley F. 03/06/2021 8:00 A M Medical Record Number: [id number] Patient Account Number: [id number] Date of Birth/Sex: Treating RN: [birth year] (75 y.o. [name] [name] Primary Care Provider: [name], [name] Other Clinician: Referring Provider: Treating Provider/Extender: Hiroshi Krummel [name], [name] Weeks in Treatment: 254 Constitutional respirations regular, non-labored and within target range for patient.Marland Kitchen Psychiatric pleasant and cooperative. Notes Right dorsal foot: Tunnel that probes to bone. Right heel: Open wound with granulation tissue nonviable tissue present. Areas of skin breakdown to the right lateral lower leg. Mild serous drainage on exam. No obvious signs of infection. Electronic Signature(s) Signed: 03/06/2021 10:02:51 AM By: Kalman Shan DO Entered By: Kalman Shan on 03/06/2021  09:57:10 -------------------------------------------------------------------------------- Physician Orders Details Patient Name: Date of Service: [name] Foley F. 03/06/2021 8:00 A M Medical Record Number: [id number] Patient Account Number: [id number] Date of Birth/Sex: Treating RN: [birth year] (75 y.o. [name] [name] Primary Care Provider: [name], [name] Other Clinician: Referring Provider: Treating Provider/Extender: Kazuto Sevey [name], [name] Weeks in Treatment: [phone] Verbal / Phone Orders: No Diagnosis Coding Follow-up Appointments ppointment in 1 week. - Dr. Dellia Nims - Tuesday Return A Bathing/ Shower/ Hygiene May shower with protection but do not get wound dressing(s) wet. Edema Control - Lymphedema / SCD / Other Elevate legs to the level of the heart or above for 30 minutes daily and/or when sitting, a frequency of: - throughout the day Avoid standing for long periods of time. Off-Loading Other: - multipodus boots to both feet, elevate heel off the bed with pillows under calf, avoid turning leg out to the side for long periods of time Wound Treatment Wound #25 - Lower Leg Wound Laterality: Right, Lateral Cleanser: Wound Cleanser (Generic) Every Other Day/30 Days Discharge Instructions: Cleanse the wound with wound cleanser prior to applying a clean dressing using gauze sponges, not tissue or cotton balls. Cleanser: Soap and Water Every Other Day/30 Days Discharge Instructions: May shower and wash wound with dial antibacterial soap and water prior to dressing change. Peri-Wound Care: Triamcinolone 15 (g) Every Other Day/30 Days Discharge Instructions: Use triamcinolone 15 (g) as directed Peri-Wound Care: Zinc Oxide Ointment 30g tube Every Other Day/30 Days Discharge Instructions: Apply Zinc Oxide to excoriated periwound with each dressing change Peri-Wound Care: Sween Lotion (Moisturizing lotion) Every Other Day/30 Days Discharge Instructions: Apply moisturizing lotion  as directed Prim Dressing: PolyMem Silver Non-Adhesive Dressing, 4.25x4.25 in (Generic) Every Other Day/30 Days ary Discharge Instructions: Apply to wound bed as instructed Secondary Dressing: Woven Gauze Sponge, Non-Sterile 4x4 in (Generic) Every Other Day/30 Days Discharge Instructions: Apply over primary dressing as directed. Secondary Dressing: ABD Pad, 5x9 (Generic) Every Other Day/30 Days Discharge Instructions: Apply over primary dressing as directed. Compression Wrap: Kerlix Roll 4.5x3.1 (in/yd) (Generic) Every Other Day/30 Days Discharge Instructions: Apply Kerlix and Coban compression as directed. Compression Wrap: Coban  combined; Coronary Artery Disease - dx'd with; Hypertension - on meds; Myocardial Infarction - 2005 , large NSTEMI 10/2015 Negative for: Angina; Deep Vein Thrombosis; Hypotension; Peripheral Arterial Disease; Peripheral Venous Disease; Phlebitis; Vasculitis Past Medical History Notes: ischemin cardiomyopathy , hyperlipidemia , possible new CVA - known CVA in 60 ; EJECTION FRACTINO -15% Gastrointestinal Medical History: Negative for: Cirrhosis ; Colitis; Crohns; Hepatitis A; Hepatitis B; Hepatitis C Endocrine Medical History: Positive for: Type II Diabetes - on insulin Negative for: Type I Diabetes Past Medical History Notes: uncontrolled -A1C on 10/2015 - 12.0 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 Time with diabetes: 12 years Treated with: Insulin Blood sugar tested every day: Yes T ested : 3x/day Blood sugar  testing results: Breakfast: insulin; Lunch: insulin; Dinner: insulin; Bedtime: insulin Genitourinary Medical History: Negative for: End Stage Renal Disease Past Medical History Notes: acute on chronic kidney disease Stage 3 , recent Klebisella UTI , Immunological Medical History: Negative for: Lupus Erythematosus; Raynauds; Scleroderma Musculoskeletal Medical History: Positive for: Gout - sporadic Negative for: Rheumatoid Arthritis; Osteoarthritis; Osteomyelitis Neurologic Medical History: Positive for: Neuropathy - bilat lower ext Negative for: Dementia; Quadriplegia; Paraplegia; Seizure Disorder Oncologic Medical History: Negative for: Received Chemotherapy; Received Radiation Psychiatric Medical History: Negative for: Anorexia/bulimia; Confinement Anxiety Immunizations Pneumococcal Vaccine: Received Pneumococcal Vaccination: No Immunization Notes: 8 years ago Implantable Devices No devices added Hospitalization / Surgery History Type of Hospitalization/Surgery kidney infection kidney infection/urinary retention left AKA 10/10/2020 Family and Social History Cancer: No; Diabetes: No; Heart Disease: Yes - Maternal Grandparents,Paternal Grandparents,Mother,Father; Hereditary Spherocytosis: No; Hypertension: Yes - Mother,Maternal Grandparents; Kidney Disease: No; Lung Disease: Yes - Mother; Seizures: No; Stroke: No; Thyroid Problems: No; Tuberculosis: No; Never smoker; Marital Status - Married; Alcohol Use: Never; Drug Use: No History; Caffeine Use: Never; Financial Concerns: No; Food, Clothing or Shelter Needs: No; Support System Lacking: No; Transportation Concerns: No Electronic Signature(s) Signed: 03/06/2021 10:02:51 AM By: Kalman Shan DO Signed: 03/06/2021 4:35:28 PM By: Lorrin Jackson Entered By: Kalman Shan on 03/06/2021 09:55:27 -------------------------------------------------------------------------------- SuperBill Details Patient Name: Date of  Service: [name] Foley F. 03/06/2021 Medical Record Number: [id number] Patient Account Number: [id number] Date of Birth/Sex: Treating RN: 10-06-1945 (75 y.o. [name] [name] Primary Care Provider: [name], [name] Other Clinician: Referring Provider: Treating Provider/Extender: Angeleah Labrake [name], [name] Weeks in Treatment: 254 Diagnosis Coding ICD-10 Codes Code Description E11.621 Type 2 diabetes mellitus with foot ulcer L97.518 Non-pressure chronic ulcer of other part of right foot with other specified severity L97.218 Non-pressure chronic ulcer of right calf with other specified severity S80.211D Abrasion, right knee, subsequent encounter L97.418 Non-pressure chronic ulcer of right heel and midfoot with other specified severity S80.211D Abrasion, right knee, subsequent encounter Facility Procedures CPT4 Code: [id number] Description: [phone] - WOUND CARE VISIT-LEV 5 EST PT Modifier: Quantity: 1 Physician Procedures : CPT4 Code Description Modifier [id number] - WC PHYS LEVEL 3 - EST PT ICD-10 Diagnosis Description E11.621 Type 2 diabetes mellitus with foot ulcer L97.518 Non-pressure chronic ulcer of other part of right foot with other specified severity S80.211D  Abrasion, right knee, subsequent encounter I45.809 Non-pressure chronic ulcer of right calf with other specified severity Quantity: 1 Electronic Signature(s) Signed: 03/06/2021 12:16:26 PM By: Kalman Shan DO Signed: 03/06/2021 4:40:13 PM By: Baruch Gouty RN, BSN Previous Signature: 03/06/2021 10:02:51 AM Version By: Kalman Shan DO Entered By: Baruch Gouty on 03/06/2021 11:32:18  upper thigh popliteal and posterior tibial artery. TBI was 0.76 great toe pressure was 100. They did not think that there was significant PAD. He had already had a right popliteal artery and right posterior tibial artery angioplasty. Follow-up in 6 months We have been using polymen. 11/14; patient presents for evaluation of increased weeping to his right lower extremity. He states that last week he had increased serous drainage but this has since resolved after 1 day. He has no issues or complaints today. He denies signs of infection. Electronic Signature(s) Signed: 03/06/2021 10:02:51 AM By: Kalman Shan DO Entered By: Kalman Shan on 03/06/2021 09:55:15 -------------------------------------------------------------------------------- Physical Exam Details Patient Name: Date of Service: [name] Foley F. 03/06/2021 8:00 A M Medical Record Number: [id number] Patient Account Number: [id number] Date of Birth/Sex: Treating RN: [birth year] (75 y.o. [name] [name] Primary Care Provider: [name], [name] Other Clinician: Referring Provider: Treating Provider/Extender: Hiroshi Krummel [name], [name] Weeks in Treatment: 254 Constitutional respirations regular, non-labored and within target range for patient.Marland Kitchen Psychiatric pleasant and cooperative. Notes Right dorsal foot: Tunnel that probes to bone. Right heel: Open wound with granulation tissue nonviable tissue present. Areas of skin breakdown to the right lateral lower leg. Mild serous drainage on exam. No obvious signs of infection. Electronic Signature(s) Signed: 03/06/2021 10:02:51 AM By: Kalman Shan DO Entered By: Kalman Shan on 03/06/2021  09:57:10 -------------------------------------------------------------------------------- Physician Orders Details Patient Name: Date of Service: [name] Foley F. 03/06/2021 8:00 A M Medical Record Number: [id number] Patient Account Number: [id number] Date of Birth/Sex: Treating RN: [birth year] (75 y.o. [name] [name] Primary Care Provider: [name], [name] Other Clinician: Referring Provider: Treating Provider/Extender: Kazuto Sevey [name], [name] Weeks in Treatment: [phone] Verbal / Phone Orders: No Diagnosis Coding Follow-up Appointments ppointment in 1 week. - Dr. Dellia Nims - Tuesday Return A Bathing/ Shower/ Hygiene May shower with protection but do not get wound dressing(s) wet. Edema Control - Lymphedema / SCD / Other Elevate legs to the level of the heart or above for 30 minutes daily and/or when sitting, a frequency of: - throughout the day Avoid standing for long periods of time. Off-Loading Other: - multipodus boots to both feet, elevate heel off the bed with pillows under calf, avoid turning leg out to the side for long periods of time Wound Treatment Wound #25 - Lower Leg Wound Laterality: Right, Lateral Cleanser: Wound Cleanser (Generic) Every Other Day/30 Days Discharge Instructions: Cleanse the wound with wound cleanser prior to applying a clean dressing using gauze sponges, not tissue or cotton balls. Cleanser: Soap and Water Every Other Day/30 Days Discharge Instructions: May shower and wash wound with dial antibacterial soap and water prior to dressing change. Peri-Wound Care: Triamcinolone 15 (g) Every Other Day/30 Days Discharge Instructions: Use triamcinolone 15 (g) as directed Peri-Wound Care: Zinc Oxide Ointment 30g tube Every Other Day/30 Days Discharge Instructions: Apply Zinc Oxide to excoriated periwound with each dressing change Peri-Wound Care: Sween Lotion (Moisturizing lotion) Every Other Day/30 Days Discharge Instructions: Apply moisturizing lotion  as directed Prim Dressing: PolyMem Silver Non-Adhesive Dressing, 4.25x4.25 in (Generic) Every Other Day/30 Days ary Discharge Instructions: Apply to wound bed as instructed Secondary Dressing: Woven Gauze Sponge, Non-Sterile 4x4 in (Generic) Every Other Day/30 Days Discharge Instructions: Apply over primary dressing as directed. Secondary Dressing: ABD Pad, 5x9 (Generic) Every Other Day/30 Days Discharge Instructions: Apply over primary dressing as directed. Compression Wrap: Kerlix Roll 4.5x3.1 (in/yd) (Generic) Every Other Day/30 Days Discharge Instructions: Apply Kerlix and Coban compression as directed. Compression Wrap: Coban  dial antibacterial soap and water prior to dressing change. Peri-Wound Care: Triamcinolone 15 (g) Every Other Day/30 Days Discharge Instructions: Use triamcinolone 15 (g) as directed Peri-Wound Care: Zinc Oxide Ointment 30g tube Every Other Day/30 Days Discharge Instructions: Apply Zinc Oxide to excoriated periwound with each dressing change Peri-Wound Care: Sween Lotion (Moisturizing lotion) Every Other Day/30 Days Discharge Instructions: Apply moisturizing lotion as directed Prim Dressing: PolyMem Silver Non-Adhesive Dressing, 4.25x4.25 in (Generic) Every Other Day/30 Days ary Discharge Instructions: Apply to wound bed as instructed Secondary Dressing: Woven Gauze Sponge, Non-Sterile 4x4 in (Generic) Every Other Day/30 Days Discharge Instructions: Apply over primary dressing as directed. Secondary Dressing: ABD Pad, 5x9 (Generic) Every Other Day/30 Days Discharge Instructions: Apply over primary dressing as directed. Com pression Wrap: Kerlix Roll 4.5x3.1 (in/yd) (Generic) Every Other Day/30 Days Discharge Instructions: Apply Kerlix and Coban compression as directed. Com pression Wrap: Coban Self-Adherent Wrap 4x5 (in/yd) (Generic) Every Other Day/30 Days Discharge Instructions: Apply over Kerlix as directed. WOUND #42: - Lower Leg Wound Laterality: Right, Anterior, Proximal Cleanser: Wound Cleanser (Generic) Every Other Day/30 Days Discharge Instructions: Cleanse the wound with wound cleanser prior to applying a clean dressing using gauze sponges, not tissue or cotton balls. Cleanser: Soap and  Water Every Other Day/30 Days Discharge Instructions: May shower and wash wound with dial antibacterial soap and water prior to dressing change. Peri-Wound Care: Triamcinolone 15 (g) Every Other Day/30 Days Discharge Instructions: Use triamcinolone 15 (g) as directed Peri-Wound Care: Sween Lotion (Moisturizing lotion) Every Other Day/30 Days Discharge Instructions: Apply moisturizing lotion as directed Prim Dressing: PolyMem Silver Non-Adhesive Dressing, 4.25x4.25 in Every Other Day/30 Days ary Discharge Instructions: Apply to wound bed as instructed Secondary Dressing: Woven Gauze Sponge, Non-Sterile 4x4 in (Generic) Every Other Day/30 Days Discharge Instructions: Apply over primary dressing as directed. Com pression Wrap: Kerlix Roll 4.5x3.1 (in/yd) (Generic) Every Other Day/30 Days Discharge Instructions: Apply Kerlix and Coban compression as directed. Com pression Wrap: Coban Self-Adherent Wrap 4x5 (in/yd) (Generic) Every Other Day/30 Days Discharge Instructions: Apply over Kerlix as directed. WOUND #43: - Knee Wound Laterality: Right Prim Dressing: PolyMem Silver Non-Adhesive Dressing, 4.25x4.25 in Every Other Day/30 Days ary Discharge Instructions: Apply to wound bed as instructed Secondary Dressing: Zetuvit Plus Silicone Border Dressing 5x5 (in/in) Every Other Day/30 Days Discharge Instructions: Apply silicone border over primary dressing as directed. 1. TCA with zinc oxide and PolyMem silver under Kerlix/Coban 2. PolyMem silver to the remaining wounds 3. Follow-up next week with Dr. Dellia Nims Electronic Signature(s) Signed: 03/06/2021 10:02:51 AM By: Kalman Shan DO Entered By: Kalman Shan on 03/06/2021 10:02:01 -------------------------------------------------------------------------------- HxROS Details Patient Name: Date of Service: [name] Foley F. 03/06/2021 8:00 A M Medical Record Number: [id number] Patient Account Number: [id number] Date of  Birth/Sex: Treating RN: [birth year] (75 y.o. [name] [name] Primary Care Provider: [name], [name] Other Clinician: Referring Provider: Treating Provider/Extender: Mariame Rybolt [name], [name] Weeks in Treatment: 254 Information Obtained From Patient Constitutional Symptoms (General Health) Medical History: Past Medical History Notes: acute encephalopathy Eyes Medical History: Negative for: Cataracts; Glaucoma; Optic Neuritis Ear/Nose/Mouth/Throat Medical History: Negative for: Chronic sinus problems/congestion; Middle ear problems Hematologic/Lymphatic Medical History: Positive for: Anemia Negative for: Hemophilia; Human Immunodeficiency Virus; Lymphedema; Sickle Cell Disease Respiratory Medical History: Positive for: Sleep Apnea - unable to tolerate devices Negative for: Aspiration; Asthma; Chronic Obstructive Pulmonary Disease (COPD); Pneumothorax; Tuberculosis Past Medical History Notes: acute hypoxic resp. failure due to aspiration pneumonia Cardiovascular Medical History: Positive for: Arrhythmia - defib and pacer inplanted; Congestive Heart Failure -  dial antibacterial soap and water prior to dressing change. Peri-Wound Care: Triamcinolone 15 (g) Every Other Day/30 Days Discharge Instructions: Use triamcinolone 15 (g) as directed Peri-Wound Care: Zinc Oxide Ointment 30g tube Every Other Day/30 Days Discharge Instructions: Apply Zinc Oxide to excoriated periwound with each dressing change Peri-Wound Care: Sween Lotion (Moisturizing lotion) Every Other Day/30 Days Discharge Instructions: Apply moisturizing lotion as directed Prim Dressing: PolyMem Silver Non-Adhesive Dressing, 4.25x4.25 in (Generic) Every Other Day/30 Days ary Discharge Instructions: Apply to wound bed as instructed Secondary Dressing: Woven Gauze Sponge, Non-Sterile 4x4 in (Generic) Every Other Day/30 Days Discharge Instructions: Apply over primary dressing as directed. Secondary Dressing: ABD Pad, 5x9 (Generic) Every Other Day/30 Days Discharge Instructions: Apply over primary dressing as directed. Com pression Wrap: Kerlix Roll 4.5x3.1 (in/yd) (Generic) Every Other Day/30 Days Discharge Instructions: Apply Kerlix and Coban compression as directed. Com pression Wrap: Coban Self-Adherent Wrap 4x5 (in/yd) (Generic) Every Other Day/30 Days Discharge Instructions: Apply over Kerlix as directed. WOUND #42: - Lower Leg Wound Laterality: Right, Anterior, Proximal Cleanser: Wound Cleanser (Generic) Every Other Day/30 Days Discharge Instructions: Cleanse the wound with wound cleanser prior to applying a clean dressing using gauze sponges, not tissue or cotton balls. Cleanser: Soap and  Water Every Other Day/30 Days Discharge Instructions: May shower and wash wound with dial antibacterial soap and water prior to dressing change. Peri-Wound Care: Triamcinolone 15 (g) Every Other Day/30 Days Discharge Instructions: Use triamcinolone 15 (g) as directed Peri-Wound Care: Sween Lotion (Moisturizing lotion) Every Other Day/30 Days Discharge Instructions: Apply moisturizing lotion as directed Prim Dressing: PolyMem Silver Non-Adhesive Dressing, 4.25x4.25 in Every Other Day/30 Days ary Discharge Instructions: Apply to wound bed as instructed Secondary Dressing: Woven Gauze Sponge, Non-Sterile 4x4 in (Generic) Every Other Day/30 Days Discharge Instructions: Apply over primary dressing as directed. Com pression Wrap: Kerlix Roll 4.5x3.1 (in/yd) (Generic) Every Other Day/30 Days Discharge Instructions: Apply Kerlix and Coban compression as directed. Com pression Wrap: Coban Self-Adherent Wrap 4x5 (in/yd) (Generic) Every Other Day/30 Days Discharge Instructions: Apply over Kerlix as directed. WOUND #43: - Knee Wound Laterality: Right Prim Dressing: PolyMem Silver Non-Adhesive Dressing, 4.25x4.25 in Every Other Day/30 Days ary Discharge Instructions: Apply to wound bed as instructed Secondary Dressing: Zetuvit Plus Silicone Border Dressing 5x5 (in/in) Every Other Day/30 Days Discharge Instructions: Apply silicone border over primary dressing as directed. 1. TCA with zinc oxide and PolyMem silver under Kerlix/Coban 2. PolyMem silver to the remaining wounds 3. Follow-up next week with Dr. Dellia Nims Electronic Signature(s) Signed: 03/06/2021 10:02:51 AM By: Kalman Shan DO Entered By: Kalman Shan on 03/06/2021 10:02:01 -------------------------------------------------------------------------------- HxROS Details Patient Name: Date of Service: [name] Foley F. 03/06/2021 8:00 A M Medical Record Number: [id number] Patient Account Number: [id number] Date of  Birth/Sex: Treating RN: [birth year] (75 y.o. [name] [name] Primary Care Provider: [name], [name] Other Clinician: Referring Provider: Treating Provider/Extender: Mariame Rybolt [name], [name] Weeks in Treatment: 254 Information Obtained From Patient Constitutional Symptoms (General Health) Medical History: Past Medical History Notes: acute encephalopathy Eyes Medical History: Negative for: Cataracts; Glaucoma; Optic Neuritis Ear/Nose/Mouth/Throat Medical History: Negative for: Chronic sinus problems/congestion; Middle ear problems Hematologic/Lymphatic Medical History: Positive for: Anemia Negative for: Hemophilia; Human Immunodeficiency Virus; Lymphedema; Sickle Cell Disease Respiratory Medical History: Positive for: Sleep Apnea - unable to tolerate devices Negative for: Aspiration; Asthma; Chronic Obstructive Pulmonary Disease (COPD); Pneumothorax; Tuberculosis Past Medical History Notes: acute hypoxic resp. failure due to aspiration pneumonia Cardiovascular Medical History: Positive for: Arrhythmia - defib and pacer inplanted; Congestive Heart Failure -  upper thigh popliteal and posterior tibial artery. TBI was 0.76 great toe pressure was 100. They did not think that there was significant PAD. He had already had a right popliteal artery and right posterior tibial artery angioplasty. Follow-up in 6 months We have been using polymen. 11/14; patient presents for evaluation of increased weeping to his right lower extremity. He states that last week he had increased serous drainage but this has since resolved after 1 day. He has no issues or complaints today. He denies signs of infection. Electronic Signature(s) Signed: 03/06/2021 10:02:51 AM By: Kalman Shan DO Entered By: Kalman Shan on 03/06/2021 09:55:15 -------------------------------------------------------------------------------- Physical Exam Details Patient Name: Date of Service: [name] Foley F. 03/06/2021 8:00 A M Medical Record Number: [id number] Patient Account Number: [id number] Date of Birth/Sex: Treating RN: [birth year] (75 y.o. [name] [name] Primary Care Provider: [name], [name] Other Clinician: Referring Provider: Treating Provider/Extender: Hiroshi Krummel [name], [name] Weeks in Treatment: 254 Constitutional respirations regular, non-labored and within target range for patient.Marland Kitchen Psychiatric pleasant and cooperative. Notes Right dorsal foot: Tunnel that probes to bone. Right heel: Open wound with granulation tissue nonviable tissue present. Areas of skin breakdown to the right lateral lower leg. Mild serous drainage on exam. No obvious signs of infection. Electronic Signature(s) Signed: 03/06/2021 10:02:51 AM By: Kalman Shan DO Entered By: Kalman Shan on 03/06/2021  09:57:10 -------------------------------------------------------------------------------- Physician Orders Details Patient Name: Date of Service: [name] Foley F. 03/06/2021 8:00 A M Medical Record Number: [id number] Patient Account Number: [id number] Date of Birth/Sex: Treating RN: [birth year] (75 y.o. [name] [name] Primary Care Provider: [name], [name] Other Clinician: Referring Provider: Treating Provider/Extender: Kazuto Sevey [name], [name] Weeks in Treatment: [phone] Verbal / Phone Orders: No Diagnosis Coding Follow-up Appointments ppointment in 1 week. - Dr. Dellia Nims - Tuesday Return A Bathing/ Shower/ Hygiene May shower with protection but do not get wound dressing(s) wet. Edema Control - Lymphedema / SCD / Other Elevate legs to the level of the heart or above for 30 minutes daily and/or when sitting, a frequency of: - throughout the day Avoid standing for long periods of time. Off-Loading Other: - multipodus boots to both feet, elevate heel off the bed with pillows under calf, avoid turning leg out to the side for long periods of time Wound Treatment Wound #25 - Lower Leg Wound Laterality: Right, Lateral Cleanser: Wound Cleanser (Generic) Every Other Day/30 Days Discharge Instructions: Cleanse the wound with wound cleanser prior to applying a clean dressing using gauze sponges, not tissue or cotton balls. Cleanser: Soap and Water Every Other Day/30 Days Discharge Instructions: May shower and wash wound with dial antibacterial soap and water prior to dressing change. Peri-Wound Care: Triamcinolone 15 (g) Every Other Day/30 Days Discharge Instructions: Use triamcinolone 15 (g) as directed Peri-Wound Care: Zinc Oxide Ointment 30g tube Every Other Day/30 Days Discharge Instructions: Apply Zinc Oxide to excoriated periwound with each dressing change Peri-Wound Care: Sween Lotion (Moisturizing lotion) Every Other Day/30 Days Discharge Instructions: Apply moisturizing lotion  as directed Prim Dressing: PolyMem Silver Non-Adhesive Dressing, 4.25x4.25 in (Generic) Every Other Day/30 Days ary Discharge Instructions: Apply to wound bed as instructed Secondary Dressing: Woven Gauze Sponge, Non-Sterile 4x4 in (Generic) Every Other Day/30 Days Discharge Instructions: Apply over primary dressing as directed. Secondary Dressing: ABD Pad, 5x9 (Generic) Every Other Day/30 Days Discharge Instructions: Apply over primary dressing as directed. Compression Wrap: Kerlix Roll 4.5x3.1 (in/yd) (Generic) Every Other Day/30 Days Discharge Instructions: Apply Kerlix and Coban compression as directed. Compression Wrap: Coban  combined; Coronary Artery Disease - dx'd with; Hypertension - on meds; Myocardial Infarction - 2005 , large NSTEMI 10/2015 Negative for: Angina; Deep Vein Thrombosis; Hypotension; Peripheral Arterial Disease; Peripheral Venous Disease; Phlebitis; Vasculitis Past Medical History Notes: ischemin cardiomyopathy , hyperlipidemia , possible new CVA - known CVA in 60 ; EJECTION FRACTINO -15% Gastrointestinal Medical History: Negative for: Cirrhosis ; Colitis; Crohns; Hepatitis A; Hepatitis B; Hepatitis C Endocrine Medical History: Positive for: Type II Diabetes - on insulin Negative for: Type I Diabetes Past Medical History Notes: uncontrolled -A1C on 10/2015 - 12.0 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 Time with diabetes: 12 years Treated with: Insulin Blood sugar tested every day: Yes T ested : 3x/day Blood sugar  testing results: Breakfast: insulin; Lunch: insulin; Dinner: insulin; Bedtime: insulin Genitourinary Medical History: Negative for: End Stage Renal Disease Past Medical History Notes: acute on chronic kidney disease Stage 3 , recent Klebisella UTI , Immunological Medical History: Negative for: Lupus Erythematosus; Raynauds; Scleroderma Musculoskeletal Medical History: Positive for: Gout - sporadic Negative for: Rheumatoid Arthritis; Osteoarthritis; Osteomyelitis Neurologic Medical History: Positive for: Neuropathy - bilat lower ext Negative for: Dementia; Quadriplegia; Paraplegia; Seizure Disorder Oncologic Medical History: Negative for: Received Chemotherapy; Received Radiation Psychiatric Medical History: Negative for: Anorexia/bulimia; Confinement Anxiety Immunizations Pneumococcal Vaccine: Received Pneumococcal Vaccination: No Immunization Notes: 8 years ago Implantable Devices No devices added Hospitalization / Surgery History Type of Hospitalization/Surgery kidney infection kidney infection/urinary retention left AKA 10/10/2020 Family and Social History Cancer: No; Diabetes: No; Heart Disease: Yes - Maternal Grandparents,Paternal Grandparents,Mother,Father; Hereditary Spherocytosis: No; Hypertension: Yes - Mother,Maternal Grandparents; Kidney Disease: No; Lung Disease: Yes - Mother; Seizures: No; Stroke: No; Thyroid Problems: No; Tuberculosis: No; Never smoker; Marital Status - Married; Alcohol Use: Never; Drug Use: No History; Caffeine Use: Never; Financial Concerns: No; Food, Clothing or Shelter Needs: No; Support System Lacking: No; Transportation Concerns: No Electronic Signature(s) Signed: 03/06/2021 10:02:51 AM By: Kalman Shan DO Signed: 03/06/2021 4:35:28 PM By: Lorrin Jackson Entered By: Kalman Shan on 03/06/2021 09:55:27 -------------------------------------------------------------------------------- SuperBill Details Patient Name: Date of  Service: [name] Foley F. 03/06/2021 Medical Record Number: [id number] Patient Account Number: [id number] Date of Birth/Sex: Treating RN: 10-06-1945 (75 y.o. [name] [name] Primary Care Provider: [name], [name] Other Clinician: Referring Provider: Treating Provider/Extender: Angeleah Labrake [name], [name] Weeks in Treatment: 254 Diagnosis Coding ICD-10 Codes Code Description E11.621 Type 2 diabetes mellitus with foot ulcer L97.518 Non-pressure chronic ulcer of other part of right foot with other specified severity L97.218 Non-pressure chronic ulcer of right calf with other specified severity S80.211D Abrasion, right knee, subsequent encounter L97.418 Non-pressure chronic ulcer of right heel and midfoot with other specified severity S80.211D Abrasion, right knee, subsequent encounter Facility Procedures CPT4 Code: [id number] Description: [phone] - WOUND CARE VISIT-LEV 5 EST PT Modifier: Quantity: 1 Physician Procedures : CPT4 Code Description Modifier [id number] - WC PHYS LEVEL 3 - EST PT ICD-10 Diagnosis Description E11.621 Type 2 diabetes mellitus with foot ulcer L97.518 Non-pressure chronic ulcer of other part of right foot with other specified severity S80.211D  Abrasion, right knee, subsequent encounter I45.809 Non-pressure chronic ulcer of right calf with other specified severity Quantity: 1 Electronic Signature(s) Signed: 03/06/2021 12:16:26 PM By: Kalman Shan DO Signed: 03/06/2021 4:40:13 PM By: Baruch Gouty RN, BSN Previous Signature: 03/06/2021 10:02:51 AM Version By: Kalman Shan DO Entered By: Baruch Gouty on 03/06/2021 11:32:18  upper thigh popliteal and posterior tibial artery. TBI was 0.76 great toe pressure was 100. They did not think that there was significant PAD. He had already had a right popliteal artery and right posterior tibial artery angioplasty. Follow-up in 6 months We have been using polymen. 11/14; patient presents for evaluation of increased weeping to his right lower extremity. He states that last week he had increased serous drainage but this has since resolved after 1 day. He has no issues or complaints today. He denies signs of infection. Electronic Signature(s) Signed: 03/06/2021 10:02:51 AM By: Kalman Shan DO Entered By: Kalman Shan on 03/06/2021 09:55:15 -------------------------------------------------------------------------------- Physical Exam Details Patient Name: Date of Service: [name] Foley F. 03/06/2021 8:00 A M Medical Record Number: [id number] Patient Account Number: [id number] Date of Birth/Sex: Treating RN: [birth year] (75 y.o. [name] [name] Primary Care Provider: [name], [name] Other Clinician: Referring Provider: Treating Provider/Extender: Hiroshi Krummel [name], [name] Weeks in Treatment: 254 Constitutional respirations regular, non-labored and within target range for patient.Marland Kitchen Psychiatric pleasant and cooperative. Notes Right dorsal foot: Tunnel that probes to bone. Right heel: Open wound with granulation tissue nonviable tissue present. Areas of skin breakdown to the right lateral lower leg. Mild serous drainage on exam. No obvious signs of infection. Electronic Signature(s) Signed: 03/06/2021 10:02:51 AM By: Kalman Shan DO Entered By: Kalman Shan on 03/06/2021  09:57:10 -------------------------------------------------------------------------------- Physician Orders Details Patient Name: Date of Service: [name] Foley F. 03/06/2021 8:00 A M Medical Record Number: [id number] Patient Account Number: [id number] Date of Birth/Sex: Treating RN: [birth year] (75 y.o. [name] [name] Primary Care Provider: [name], [name] Other Clinician: Referring Provider: Treating Provider/Extender: Kazuto Sevey [name], [name] Weeks in Treatment: [phone] Verbal / Phone Orders: No Diagnosis Coding Follow-up Appointments ppointment in 1 week. - Dr. Dellia Nims - Tuesday Return A Bathing/ Shower/ Hygiene May shower with protection but do not get wound dressing(s) wet. Edema Control - Lymphedema / SCD / Other Elevate legs to the level of the heart or above for 30 minutes daily and/or when sitting, a frequency of: - throughout the day Avoid standing for long periods of time. Off-Loading Other: - multipodus boots to both feet, elevate heel off the bed with pillows under calf, avoid turning leg out to the side for long periods of time Wound Treatment Wound #25 - Lower Leg Wound Laterality: Right, Lateral Cleanser: Wound Cleanser (Generic) Every Other Day/30 Days Discharge Instructions: Cleanse the wound with wound cleanser prior to applying a clean dressing using gauze sponges, not tissue or cotton balls. Cleanser: Soap and Water Every Other Day/30 Days Discharge Instructions: May shower and wash wound with dial antibacterial soap and water prior to dressing change. Peri-Wound Care: Triamcinolone 15 (g) Every Other Day/30 Days Discharge Instructions: Use triamcinolone 15 (g) as directed Peri-Wound Care: Zinc Oxide Ointment 30g tube Every Other Day/30 Days Discharge Instructions: Apply Zinc Oxide to excoriated periwound with each dressing change Peri-Wound Care: Sween Lotion (Moisturizing lotion) Every Other Day/30 Days Discharge Instructions: Apply moisturizing lotion  as directed Prim Dressing: PolyMem Silver Non-Adhesive Dressing, 4.25x4.25 in (Generic) Every Other Day/30 Days ary Discharge Instructions: Apply to wound bed as instructed Secondary Dressing: Woven Gauze Sponge, Non-Sterile 4x4 in (Generic) Every Other Day/30 Days Discharge Instructions: Apply over primary dressing as directed. Secondary Dressing: ABD Pad, 5x9 (Generic) Every Other Day/30 Days Discharge Instructions: Apply over primary dressing as directed. Compression Wrap: Kerlix Roll 4.5x3.1 (in/yd) (Generic) Every Other Day/30 Days Discharge Instructions: Apply Kerlix and Coban compression as directed. Compression Wrap: Coban  dial antibacterial soap and water prior to dressing change. Peri-Wound Care: Triamcinolone 15 (g) Every Other Day/30 Days Discharge Instructions: Use triamcinolone 15 (g) as directed Peri-Wound Care: Zinc Oxide Ointment 30g tube Every Other Day/30 Days Discharge Instructions: Apply Zinc Oxide to excoriated periwound with each dressing change Peri-Wound Care: Sween Lotion (Moisturizing lotion) Every Other Day/30 Days Discharge Instructions: Apply moisturizing lotion as directed Prim Dressing: PolyMem Silver Non-Adhesive Dressing, 4.25x4.25 in (Generic) Every Other Day/30 Days ary Discharge Instructions: Apply to wound bed as instructed Secondary Dressing: Woven Gauze Sponge, Non-Sterile 4x4 in (Generic) Every Other Day/30 Days Discharge Instructions: Apply over primary dressing as directed. Secondary Dressing: ABD Pad, 5x9 (Generic) Every Other Day/30 Days Discharge Instructions: Apply over primary dressing as directed. Com pression Wrap: Kerlix Roll 4.5x3.1 (in/yd) (Generic) Every Other Day/30 Days Discharge Instructions: Apply Kerlix and Coban compression as directed. Com pression Wrap: Coban Self-Adherent Wrap 4x5 (in/yd) (Generic) Every Other Day/30 Days Discharge Instructions: Apply over Kerlix as directed. WOUND #42: - Lower Leg Wound Laterality: Right, Anterior, Proximal Cleanser: Wound Cleanser (Generic) Every Other Day/30 Days Discharge Instructions: Cleanse the wound with wound cleanser prior to applying a clean dressing using gauze sponges, not tissue or cotton balls. Cleanser: Soap and  Water Every Other Day/30 Days Discharge Instructions: May shower and wash wound with dial antibacterial soap and water prior to dressing change. Peri-Wound Care: Triamcinolone 15 (g) Every Other Day/30 Days Discharge Instructions: Use triamcinolone 15 (g) as directed Peri-Wound Care: Sween Lotion (Moisturizing lotion) Every Other Day/30 Days Discharge Instructions: Apply moisturizing lotion as directed Prim Dressing: PolyMem Silver Non-Adhesive Dressing, 4.25x4.25 in Every Other Day/30 Days ary Discharge Instructions: Apply to wound bed as instructed Secondary Dressing: Woven Gauze Sponge, Non-Sterile 4x4 in (Generic) Every Other Day/30 Days Discharge Instructions: Apply over primary dressing as directed. Com pression Wrap: Kerlix Roll 4.5x3.1 (in/yd) (Generic) Every Other Day/30 Days Discharge Instructions: Apply Kerlix and Coban compression as directed. Com pression Wrap: Coban Self-Adherent Wrap 4x5 (in/yd) (Generic) Every Other Day/30 Days Discharge Instructions: Apply over Kerlix as directed. WOUND #43: - Knee Wound Laterality: Right Prim Dressing: PolyMem Silver Non-Adhesive Dressing, 4.25x4.25 in Every Other Day/30 Days ary Discharge Instructions: Apply to wound bed as instructed Secondary Dressing: Zetuvit Plus Silicone Border Dressing 5x5 (in/in) Every Other Day/30 Days Discharge Instructions: Apply silicone border over primary dressing as directed. 1. TCA with zinc oxide and PolyMem silver under Kerlix/Coban 2. PolyMem silver to the remaining wounds 3. Follow-up next week with Dr. Dellia Nims Electronic Signature(s) Signed: 03/06/2021 10:02:51 AM By: Kalman Shan DO Entered By: Kalman Shan on 03/06/2021 10:02:01 -------------------------------------------------------------------------------- HxROS Details Patient Name: Date of Service: [name] Foley F. 03/06/2021 8:00 A M Medical Record Number: [id number] Patient Account Number: [id number] Date of  Birth/Sex: Treating RN: [birth year] (75 y.o. [name] [name] Primary Care Provider: [name], [name] Other Clinician: Referring Provider: Treating Provider/Extender: Mariame Rybolt [name], [name] Weeks in Treatment: 254 Information Obtained From Patient Constitutional Symptoms (General Health) Medical History: Past Medical History Notes: acute encephalopathy Eyes Medical History: Negative for: Cataracts; Glaucoma; Optic Neuritis Ear/Nose/Mouth/Throat Medical History: Negative for: Chronic sinus problems/congestion; Middle ear problems Hematologic/Lymphatic Medical History: Positive for: Anemia Negative for: Hemophilia; Human Immunodeficiency Virus; Lymphedema; Sickle Cell Disease Respiratory Medical History: Positive for: Sleep Apnea - unable to tolerate devices Negative for: Aspiration; Asthma; Chronic Obstructive Pulmonary Disease (COPD); Pneumothorax; Tuberculosis Past Medical History Notes: acute hypoxic resp. failure due to aspiration pneumonia Cardiovascular Medical History: Positive for: Arrhythmia - defib and pacer inplanted; Congestive Heart Failure -  dial antibacterial soap and water prior to dressing change. Peri-Wound Care: Triamcinolone 15 (g) Every Other Day/30 Days Discharge Instructions: Use triamcinolone 15 (g) as directed Peri-Wound Care: Zinc Oxide Ointment 30g tube Every Other Day/30 Days Discharge Instructions: Apply Zinc Oxide to excoriated periwound with each dressing change Peri-Wound Care: Sween Lotion (Moisturizing lotion) Every Other Day/30 Days Discharge Instructions: Apply moisturizing lotion as directed Prim Dressing: PolyMem Silver Non-Adhesive Dressing, 4.25x4.25 in (Generic) Every Other Day/30 Days ary Discharge Instructions: Apply to wound bed as instructed Secondary Dressing: Woven Gauze Sponge, Non-Sterile 4x4 in (Generic) Every Other Day/30 Days Discharge Instructions: Apply over primary dressing as directed. Secondary Dressing: ABD Pad, 5x9 (Generic) Every Other Day/30 Days Discharge Instructions: Apply over primary dressing as directed. Com pression Wrap: Kerlix Roll 4.5x3.1 (in/yd) (Generic) Every Other Day/30 Days Discharge Instructions: Apply Kerlix and Coban compression as directed. Com pression Wrap: Coban Self-Adherent Wrap 4x5 (in/yd) (Generic) Every Other Day/30 Days Discharge Instructions: Apply over Kerlix as directed. WOUND #42: - Lower Leg Wound Laterality: Right, Anterior, Proximal Cleanser: Wound Cleanser (Generic) Every Other Day/30 Days Discharge Instructions: Cleanse the wound with wound cleanser prior to applying a clean dressing using gauze sponges, not tissue or cotton balls. Cleanser: Soap and  Water Every Other Day/30 Days Discharge Instructions: May shower and wash wound with dial antibacterial soap and water prior to dressing change. Peri-Wound Care: Triamcinolone 15 (g) Every Other Day/30 Days Discharge Instructions: Use triamcinolone 15 (g) as directed Peri-Wound Care: Sween Lotion (Moisturizing lotion) Every Other Day/30 Days Discharge Instructions: Apply moisturizing lotion as directed Prim Dressing: PolyMem Silver Non-Adhesive Dressing, 4.25x4.25 in Every Other Day/30 Days ary Discharge Instructions: Apply to wound bed as instructed Secondary Dressing: Woven Gauze Sponge, Non-Sterile 4x4 in (Generic) Every Other Day/30 Days Discharge Instructions: Apply over primary dressing as directed. Com pression Wrap: Kerlix Roll 4.5x3.1 (in/yd) (Generic) Every Other Day/30 Days Discharge Instructions: Apply Kerlix and Coban compression as directed. Com pression Wrap: Coban Self-Adherent Wrap 4x5 (in/yd) (Generic) Every Other Day/30 Days Discharge Instructions: Apply over Kerlix as directed. WOUND #43: - Knee Wound Laterality: Right Prim Dressing: PolyMem Silver Non-Adhesive Dressing, 4.25x4.25 in Every Other Day/30 Days ary Discharge Instructions: Apply to wound bed as instructed Secondary Dressing: Zetuvit Plus Silicone Border Dressing 5x5 (in/in) Every Other Day/30 Days Discharge Instructions: Apply silicone border over primary dressing as directed. 1. TCA with zinc oxide and PolyMem silver under Kerlix/Coban 2. PolyMem silver to the remaining wounds 3. Follow-up next week with Dr. Dellia Nims Electronic Signature(s) Signed: 03/06/2021 10:02:51 AM By: Kalman Shan DO Entered By: Kalman Shan on 03/06/2021 10:02:01 -------------------------------------------------------------------------------- HxROS Details Patient Name: Date of Service: [name] Foley F. 03/06/2021 8:00 A M Medical Record Number: [id number] Patient Account Number: [id number] Date of  Birth/Sex: Treating RN: [birth year] (75 y.o. [name] [name] Primary Care Provider: [name], [name] Other Clinician: Referring Provider: Treating Provider/Extender: Mariame Rybolt [name], [name] Weeks in Treatment: 254 Information Obtained From Patient Constitutional Symptoms (General Health) Medical History: Past Medical History Notes: acute encephalopathy Eyes Medical History: Negative for: Cataracts; Glaucoma; Optic Neuritis Ear/Nose/Mouth/Throat Medical History: Negative for: Chronic sinus problems/congestion; Middle ear problems Hematologic/Lymphatic Medical History: Positive for: Anemia Negative for: Hemophilia; Human Immunodeficiency Virus; Lymphedema; Sickle Cell Disease Respiratory Medical History: Positive for: Sleep Apnea - unable to tolerate devices Negative for: Aspiration; Asthma; Chronic Obstructive Pulmonary Disease (COPD); Pneumothorax; Tuberculosis Past Medical History Notes: acute hypoxic resp. failure due to aspiration pneumonia Cardiovascular Medical History: Positive for: Arrhythmia - defib and pacer inplanted; Congestive Heart Failure -  upper thigh popliteal and posterior tibial artery. TBI was 0.76 great toe pressure was 100. They did not think that there was significant PAD. He had already had a right popliteal artery and right posterior tibial artery angioplasty. Follow-up in 6 months We have been using polymen. 11/14; patient presents for evaluation of increased weeping to his right lower extremity. He states that last week he had increased serous drainage but this has since resolved after 1 day. He has no issues or complaints today. He denies signs of infection. Electronic Signature(s) Signed: 03/06/2021 10:02:51 AM By: Kalman Shan DO Entered By: Kalman Shan on 03/06/2021 09:55:15 -------------------------------------------------------------------------------- Physical Exam Details Patient Name: Date of Service: [name] Foley F. 03/06/2021 8:00 A M Medical Record Number: [id number] Patient Account Number: [id number] Date of Birth/Sex: Treating RN: [birth year] (75 y.o. [name] [name] Primary Care Provider: [name], [name] Other Clinician: Referring Provider: Treating Provider/Extender: Hiroshi Krummel [name], [name] Weeks in Treatment: 254 Constitutional respirations regular, non-labored and within target range for patient.Marland Kitchen Psychiatric pleasant and cooperative. Notes Right dorsal foot: Tunnel that probes to bone. Right heel: Open wound with granulation tissue nonviable tissue present. Areas of skin breakdown to the right lateral lower leg. Mild serous drainage on exam. No obvious signs of infection. Electronic Signature(s) Signed: 03/06/2021 10:02:51 AM By: Kalman Shan DO Entered By: Kalman Shan on 03/06/2021  09:57:10 -------------------------------------------------------------------------------- Physician Orders Details Patient Name: Date of Service: [name] Foley F. 03/06/2021 8:00 A M Medical Record Number: [id number] Patient Account Number: [id number] Date of Birth/Sex: Treating RN: [birth year] (75 y.o. [name] [name] Primary Care Provider: [name], [name] Other Clinician: Referring Provider: Treating Provider/Extender: Kazuto Sevey [name], [name] Weeks in Treatment: [phone] Verbal / Phone Orders: No Diagnosis Coding Follow-up Appointments ppointment in 1 week. - Dr. Dellia Nims - Tuesday Return A Bathing/ Shower/ Hygiene May shower with protection but do not get wound dressing(s) wet. Edema Control - Lymphedema / SCD / Other Elevate legs to the level of the heart or above for 30 minutes daily and/or when sitting, a frequency of: - throughout the day Avoid standing for long periods of time. Off-Loading Other: - multipodus boots to both feet, elevate heel off the bed with pillows under calf, avoid turning leg out to the side for long periods of time Wound Treatment Wound #25 - Lower Leg Wound Laterality: Right, Lateral Cleanser: Wound Cleanser (Generic) Every Other Day/30 Days Discharge Instructions: Cleanse the wound with wound cleanser prior to applying a clean dressing using gauze sponges, not tissue or cotton balls. Cleanser: Soap and Water Every Other Day/30 Days Discharge Instructions: May shower and wash wound with dial antibacterial soap and water prior to dressing change. Peri-Wound Care: Triamcinolone 15 (g) Every Other Day/30 Days Discharge Instructions: Use triamcinolone 15 (g) as directed Peri-Wound Care: Zinc Oxide Ointment 30g tube Every Other Day/30 Days Discharge Instructions: Apply Zinc Oxide to excoriated periwound with each dressing change Peri-Wound Care: Sween Lotion (Moisturizing lotion) Every Other Day/30 Days Discharge Instructions: Apply moisturizing lotion  as directed Prim Dressing: PolyMem Silver Non-Adhesive Dressing, 4.25x4.25 in (Generic) Every Other Day/30 Days ary Discharge Instructions: Apply to wound bed as instructed Secondary Dressing: Woven Gauze Sponge, Non-Sterile 4x4 in (Generic) Every Other Day/30 Days Discharge Instructions: Apply over primary dressing as directed. Secondary Dressing: ABD Pad, 5x9 (Generic) Every Other Day/30 Days Discharge Instructions: Apply over primary dressing as directed. Compression Wrap: Kerlix Roll 4.5x3.1 (in/yd) (Generic) Every Other Day/30 Days Discharge Instructions: Apply Kerlix and Coban compression as directed. Compression Wrap: Coban  dial antibacterial soap and water prior to dressing change. Peri-Wound Care: Triamcinolone 15 (g) Every Other Day/30 Days Discharge Instructions: Use triamcinolone 15 (g) as directed Peri-Wound Care: Zinc Oxide Ointment 30g tube Every Other Day/30 Days Discharge Instructions: Apply Zinc Oxide to excoriated periwound with each dressing change Peri-Wound Care: Sween Lotion (Moisturizing lotion) Every Other Day/30 Days Discharge Instructions: Apply moisturizing lotion as directed Prim Dressing: PolyMem Silver Non-Adhesive Dressing, 4.25x4.25 in (Generic) Every Other Day/30 Days ary Discharge Instructions: Apply to wound bed as instructed Secondary Dressing: Woven Gauze Sponge, Non-Sterile 4x4 in (Generic) Every Other Day/30 Days Discharge Instructions: Apply over primary dressing as directed. Secondary Dressing: ABD Pad, 5x9 (Generic) Every Other Day/30 Days Discharge Instructions: Apply over primary dressing as directed. Com pression Wrap: Kerlix Roll 4.5x3.1 (in/yd) (Generic) Every Other Day/30 Days Discharge Instructions: Apply Kerlix and Coban compression as directed. Com pression Wrap: Coban Self-Adherent Wrap 4x5 (in/yd) (Generic) Every Other Day/30 Days Discharge Instructions: Apply over Kerlix as directed. WOUND #42: - Lower Leg Wound Laterality: Right, Anterior, Proximal Cleanser: Wound Cleanser (Generic) Every Other Day/30 Days Discharge Instructions: Cleanse the wound with wound cleanser prior to applying a clean dressing using gauze sponges, not tissue or cotton balls. Cleanser: Soap and  Water Every Other Day/30 Days Discharge Instructions: May shower and wash wound with dial antibacterial soap and water prior to dressing change. Peri-Wound Care: Triamcinolone 15 (g) Every Other Day/30 Days Discharge Instructions: Use triamcinolone 15 (g) as directed Peri-Wound Care: Sween Lotion (Moisturizing lotion) Every Other Day/30 Days Discharge Instructions: Apply moisturizing lotion as directed Prim Dressing: PolyMem Silver Non-Adhesive Dressing, 4.25x4.25 in Every Other Day/30 Days ary Discharge Instructions: Apply to wound bed as instructed Secondary Dressing: Woven Gauze Sponge, Non-Sterile 4x4 in (Generic) Every Other Day/30 Days Discharge Instructions: Apply over primary dressing as directed. Com pression Wrap: Kerlix Roll 4.5x3.1 (in/yd) (Generic) Every Other Day/30 Days Discharge Instructions: Apply Kerlix and Coban compression as directed. Com pression Wrap: Coban Self-Adherent Wrap 4x5 (in/yd) (Generic) Every Other Day/30 Days Discharge Instructions: Apply over Kerlix as directed. WOUND #43: - Knee Wound Laterality: Right Prim Dressing: PolyMem Silver Non-Adhesive Dressing, 4.25x4.25 in Every Other Day/30 Days ary Discharge Instructions: Apply to wound bed as instructed Secondary Dressing: Zetuvit Plus Silicone Border Dressing 5x5 (in/in) Every Other Day/30 Days Discharge Instructions: Apply silicone border over primary dressing as directed. 1. TCA with zinc oxide and PolyMem silver under Kerlix/Coban 2. PolyMem silver to the remaining wounds 3. Follow-up next week with Dr. Dellia Nims Electronic Signature(s) Signed: 03/06/2021 10:02:51 AM By: Kalman Shan DO Entered By: Kalman Shan on 03/06/2021 10:02:01 -------------------------------------------------------------------------------- HxROS Details Patient Name: Date of Service: [name] Foley F. 03/06/2021 8:00 A M Medical Record Number: [id number] Patient Account Number: [id number] Date of  Birth/Sex: Treating RN: [birth year] (75 y.o. [name] [name] Primary Care Provider: [name], [name] Other Clinician: Referring Provider: Treating Provider/Extender: Mariame Rybolt [name], [name] Weeks in Treatment: 254 Information Obtained From Patient Constitutional Symptoms (General Health) Medical History: Past Medical History Notes: acute encephalopathy Eyes Medical History: Negative for: Cataracts; Glaucoma; Optic Neuritis Ear/Nose/Mouth/Throat Medical History: Negative for: Chronic sinus problems/congestion; Middle ear problems Hematologic/Lymphatic Medical History: Positive for: Anemia Negative for: Hemophilia; Human Immunodeficiency Virus; Lymphedema; Sickle Cell Disease Respiratory Medical History: Positive for: Sleep Apnea - unable to tolerate devices Negative for: Aspiration; Asthma; Chronic Obstructive Pulmonary Disease (COPD); Pneumothorax; Tuberculosis Past Medical History Notes: acute hypoxic resp. failure due to aspiration pneumonia Cardiovascular Medical History: Positive for: Arrhythmia - defib and pacer inplanted; Congestive Heart Failure -

## 2021-03-07 ENCOUNTER — Other Ambulatory Visit (HOSPITAL_COMMUNITY): Payer: Self-pay | Admitting: Internal Medicine

## 2021-03-07 ENCOUNTER — Encounter (HOSPITAL_BASED_OUTPATIENT_CLINIC_OR_DEPARTMENT_OTHER): Payer: Medicare HMO | Admitting: Internal Medicine

## 2021-03-07 DIAGNOSIS — E11621 Type 2 diabetes mellitus with foot ulcer: Secondary | ICD-10-CM

## 2021-03-07 DIAGNOSIS — L97518 Non-pressure chronic ulcer of other part of right foot with other specified severity: Secondary | ICD-10-CM

## 2021-03-07 DIAGNOSIS — L97509 Non-pressure chronic ulcer of other part of unspecified foot with unspecified severity: Secondary | ICD-10-CM

## 2021-03-08 ENCOUNTER — Telehealth: Payer: Self-pay

## 2021-03-08 DIAGNOSIS — Z89612 Acquired absence of left leg above knee: Secondary | ICD-10-CM

## 2021-03-08 NOTE — Telephone Encounter (Signed)
I called and spoke with patient. He had his leg amputated when he was in the hospital. He needs help getting in and out of bed. His daughter usually helps him, but she will be gone for a while and his wife is not able to help. HH is not an option due to no OT/PT needed. I have sent a message to our social worker and to CCM for options. I advised pt I would reach back out once I had a response and some options.

## 2021-03-08 NOTE — Telephone Encounter (Signed)
Patient called asking for a all back no information given

## 2021-03-08 NOTE — Addendum Note (Signed)
Addended by: Rodrigo Ran on: 03/08/2021 03:38 PM   Modules accepted: Orders

## 2021-03-09 ENCOUNTER — Ambulatory Visit: Payer: Medicare HMO | Admitting: *Deleted

## 2021-03-09 ENCOUNTER — Telehealth: Payer: Self-pay | Admitting: *Deleted

## 2021-03-09 DIAGNOSIS — E785 Hyperlipidemia, unspecified: Secondary | ICD-10-CM

## 2021-03-09 DIAGNOSIS — Y92009 Unspecified place in unspecified non-institutional (private) residence as the place of occurrence of the external cause: Secondary | ICD-10-CM

## 2021-03-09 DIAGNOSIS — E1121 Type 2 diabetes mellitus with diabetic nephropathy: Secondary | ICD-10-CM

## 2021-03-09 DIAGNOSIS — R5383 Other fatigue: Secondary | ICD-10-CM

## 2021-03-09 DIAGNOSIS — R55 Syncope and collapse: Secondary | ICD-10-CM

## 2021-03-09 DIAGNOSIS — I255 Ischemic cardiomyopathy: Secondary | ICD-10-CM

## 2021-03-09 DIAGNOSIS — I130 Hypertensive heart and chronic kidney disease with heart failure and stage 1 through stage 4 chronic kidney disease, or unspecified chronic kidney disease: Secondary | ICD-10-CM

## 2021-03-09 DIAGNOSIS — N1832 Chronic kidney disease, stage 3b: Secondary | ICD-10-CM

## 2021-03-09 DIAGNOSIS — I472 Ventricular tachycardia, unspecified: Secondary | ICD-10-CM

## 2021-03-09 DIAGNOSIS — Z89612 Acquired absence of left leg above knee: Secondary | ICD-10-CM

## 2021-03-09 DIAGNOSIS — I2510 Atherosclerotic heart disease of native coronary artery without angina pectoris: Secondary | ICD-10-CM

## 2021-03-09 NOTE — Chronic Care Management (AMB) (Signed)
  Chronic Care Management   Note  03/09/2021 Name: [name] [name] MRN: [id number] DOB: October 13, 1945  [name] [name] is a 75 y.o. year old male who is a primary care patient of [name], Malka So, MD. [name] [name] is currently enrolled in care management services. An additional referral for Licensed Clinical SW  was placed.   Follow up plan: Telephone appointment with care management team member scheduled for:03/09/21  [place] Management  Direct Dial: [phone]

## 2021-03-10 ENCOUNTER — Encounter: Payer: Self-pay | Admitting: *Deleted

## 2021-03-10 NOTE — Telephone Encounter (Signed)
Patient had visit with social worker to find different options to help.

## 2021-03-10 NOTE — Chronic Care Management (AMB) (Signed)
Chronic Care Management    Clinical Social Work Note  03/10/2021 Name: [name] [name] MRN: [id number] DOB: [birth year]  [name] [name] is a 75 y.o. year old male who is a primary care patient of [name], Malka So, MD. The CCM team was consulted to assist the patient with chronic disease management and/or care coordination needs related to: Transportation Needs, [employer], Food Insecurity, Level of Care Concerns, Caregiver Stress, and Financial Difficulties.   Engaged with patient by telephone for initial visit in response to provider referral for social work chronic care management and care coordination services.   Consent to Services:  The patient was given the following information about Chronic Care Management services today, agreed to services, and gave verbal consent: 1. CCM service includes personalized support from designated clinical staff supervised by the primary care provider, including individualized plan of care and coordination with other care providers 2. 24/7 contact phone numbers for assistance for urgent and routine care needs. 3. Service will only be billed when office clinical staff spend 20 minutes or more in a month to coordinate care. 4. Only one practitioner may furnish and bill the service in a calendar month. 5.The patient may stop CCM services at any time (effective at the end of the month) by phone call to the office staff. 6. The patient will be responsible for cost sharing (co-pay) of up to 20% of the service fee (after annual deductible is met). Patient agreed to services and consent obtained.  Patient agreed to services and consent obtained.   Assessment: Review of patient past medical history, allergies, medications, and health status, including review of relevant consultants reports was performed today as part of a comprehensive evaluation and provision of chronic care management and care coordination services.     SDOH (Social Determinants of Health)  assessments and interventions performed:  SDOH Interventions    Flowsheet Row Most Recent Value  SDOH Interventions   Food Insecurity Interventions Intervention Not Indicated, Assist with SNAP Application, Other (Comment)  [Referral to Care Guides]  Financial Strain Interventions Intervention Not Indicated  Housing Interventions Intervention Not Indicated  Intimate Partner Violence Interventions Intervention Not Indicated  Physical Activity Interventions Patient Refused  Stress Interventions Intervention Not Indicated, Offered [employer], Provide Counseling, Patient Refused  Social Connections Interventions Intervention Not Indicated, Patient Refused  Transportation Interventions Intervention Not Indicated, Financial planner        Advanced Directives Status: See Care Plan for related entries.  CCM Care Plan  Allergies  Allergen Reactions   Tape Other (See Comments)    SKIN IS VERY FRAGILE- WILL TEAR AND BRUISE EASILY!!   Sulfa Antibiotics Rash    Outpatient Encounter Medications as of 03/09/2021  Medication Sig Note   acetaminophen (TYLENOL) 500 MG tablet Take 1,000 mg by mouth every 6 (six) hours as needed for moderate pain or headache.    Alcohol Swabs (B-D SINGLE USE SWABS REGULAR) PADS USE TO TEST BLOOD SUGAR DAILY    amiodarone (PACERONE) 200 MG tablet TAKE 1 TABLET EVERY DAY    Ascorbic Acid (VITAMIN C PO) Take 2 tablets by mouth daily.    aspirin EC 81 MG tablet Take 81 mg by mouth daily.    Blood Glucose Calibration (TRUE METRIX LEVEL 1) Low SOLN USE TO TEST BLOOD SUGAR DAILY    Blood Glucose Monitoring Suppl (TRUE METRIX AIR GLUCOSE METER) w/Device KIT 1 Device by Does not apply route daily.    Blood Pressure Monitoring (BLOOD PRESSURE MONITOR AUTOMAT)  DEVI To check BP daily    carvedilol (COREG) 3.125 MG tablet TAKE 1 TABLET (3.125 MG TOTAL) BY MOUTH 2 (TWO) TIMES DAILY WITH A MEAL. MUST KEEP PENDING APPT FOR FURTHER REFILLS     clopidogrel (PLAVIX) 75 MG tablet TAKE 1 TABLET EVERY DAY    clotrimazole-betamethasone (LOTRISONE) cream Apply 1 application topically daily as needed. mall amount.    finasteride (PROSCAR) 5 MG tablet TAKE 1 TABLET EVERY DAY    HYDROcodone-acetaminophen (NORCO/VICODIN) 5-325 MG tablet Take 1 tablet by mouth every 4 (four) hours as needed for moderate pain. 08/18/2020: .   insulin aspart (NOVOLOG) 100 UNIT/ML injection Inject 3 Units into the skin 3 (three) times daily with meals.    insulin glargine (LANTUS SOLOSTAR) 100 UNIT/ML Solostar Pen Inject 10 Units into the skin at bedtime. (Patient taking differently: Inject 30 Units into the skin at bedtime.)    losartan (COZAAR) 25 MG tablet TAKE 1/2 TABLET TWICE DAILY    mirtazapine (REMERON) 15 MG tablet TAKE 1 TABLET (15 MG TOTAL) BY MOUTH AT BEDTIME.    Multiple Vitamin (MULTIVITAMIN WITH MINERALS) TABS tablet Take 1 tablet by mouth daily. One-A-Day    Multiple Vitamins-Minerals (ZINC PO) Take 1 tablet by mouth daily.    nystatin (MYCOSTATIN/NYSTOP) powder Apply topically 3 (three) times daily.    rosuvastatin (CRESTOR) 40 MG tablet TAKE 1 TABLET EVERY DAY    SANTYL ointment Apply 1 application topically as needed (for wound care).    tamsulosin (FLOMAX) 0.4 MG CAPS capsule TAKE 1 CAPSULE EVERY DAY    torsemide (DEMADEX) 20 MG tablet TAKE 1 TABLET EVERY OTHER DAY    TRUE METRIX BLOOD GLUCOSE TEST test strip USE  AS  INSTRUCTED    TRUEplus Lancets 30G MISC USE  TO  TEST BLOOD SUGAR EVERY DAY (Patient taking differently: USE  TO  TEST BLOOD SUGAR EVERY DAY)    Vitamin A 2400 MCG (8000 UT) CAPS Take 2,400 mcg by mouth daily.    No facility-administered encounter medications on file as of 03/09/2021.    Patient Active Problem List   Diagnosis Date Noted   S/P AKA (above knee amputation) unilateral, left ([place]) 11/09/2020   Chronic osteomyelitis ([place])    Pyuria    Chronic indwelling Foley catheter    Pressure injury of skin 10/11/2020    Non-healing wound of lower extremity 10/10/2020   Non-healing wound of left lower extremity 10/10/2020   PAD (peripheral artery disease) ([place]) 09/27/2020   Chronic osteomyelitis of toe, left ([place]) 09/01/2020   MRSA infection 09/01/2020   Enterococcus faecalis infection 09/01/2020   Polymicrobial bacterial infection 09/01/2020   Influenza A 32/44/0102   Acute metabolic encephalopathy 72/53/6644   Constipation 10/30/2016   Neurogenic bladder 10/30/2016   Sacral decubitus ulcer, stage II ([place]) 10/30/2016   Unstable gait 03/27/2016   Atrial flutter ([place]) 03/07/2016   Coronary artery disease involving coronary bypass graft of native heart without angina pectoris    Chronic combined systolic and diastolic congestive heart failure, NYHA class 3 (HCC)    Hyperkalemia 03/06/2016   S/P hip hemiarthroplasty 01/03/2016   Pressure ulcer 12/17/2015   Fracture, hip ([place]) 12/16/2015   Depression, major, single episode, mild ([place]) 12/13/2015   Left foot drop 11/21/2015   Diabetic polyneuropathy associated with type 2 diabetes mellitus ([place]) 11/21/2015   Acute kidney injury superimposed on CKD ([place])    Diabetic ulcer of left foot associated with type 2 diabetes mellitus ([place])    Type 2 diabetes mellitus with  diabetic nephropathy, with long-term current use of insulin (HCC)    Type 2 diabetes mellitus with left diabetic foot ulcer (HCC)    Klebsiella infection    Aspiration pneumonia of right lower lobe due to vomit (HCC)    Chronic combined systolic and diastolic congestive heart failure (HCC)    HLD (hyperlipidemia)    Uncontrolled type 2 diabetes mellitus with complication    Ventricular tachycardia    NSTEMI (non-ST elevated myocardial infarction) ([place])    PAH (pulmonary artery hypertension) (HCC)    CKD (chronic kidney disease) stage 3, GFR 30-59 ml/min ([place]) 08/16/2015   UTI (urinary tract infection) 04/10/2015   Syncope 04/10/2015   Fatigue, possible anginal equivalent  12/31/2013   CAD,  multiple vessel, RCA 100% mid occl.; LAD 99% stenosisprox.  mid of 50-60%; LCX OM-1 90%, OM-2 99%,AV groove 80%  12/10/2013   MVP (mitral valve prolapse) 12/01/2013   Proteinuria 02/25/2013   Type 2 diabetes mellitus with diabetic nephropathy ([place]) 02/25/2013   Automatic implantable cardioverter-defibrillator in situ 05/06/2012   Polyneuropathy due to secondary diabetes mellitus ([place]) 04/21/2012   Bladder outlet obstruction 12/25/2011   Dysarthria 09/06/2011   Chronic combined systolic and diastolic heart failure ([place]) 10/11/2009   GOUT 10/07/2007   Type II diabetes mellitus with neurological manifestations, uncontrolled 08/12/2007   Ischemic cardiomyopathy 04/11/2007   Obstructive sleep apnea 02/27/2007   Hypertension with congestive heart failure and renal failure ([place]) 02/27/2007   Old Anterior MI (myocardial infarction) 02/27/2007    Conditions to be addressed/monitored: HTN and DMII.  Film/video editor, Limited Social Support, Transportation, Limited Access to [employer], Level of Care Concerns, ADL/IADL Limitations, Limited Access to Caregiver, and [employer] of [employer].  Care Plan : LCSW Plan of Care  Updates made by Francis Gaines, LCSW since 03/10/2021 12:00 AM     Problem: Find Help in My Community.   Priority: High     Goal: Find Help in My Community.   Start Date: 03/09/2021  Expected End Date: 05/09/2021  This Visit's Progress: On track  Priority: High  Note:   Current Barriers:   Patient with Type 2 Diabetes Mellitus with Diabetic Nephropathy, Hypertension, Coronary Artery Disease, Major Depression, Single Episode, Mild and Status-Post Unilateral Above Knee Amputee, needs Support, Education, Referrals, Resources, Advocacy and Care Coordination to resolve unmet personal care needs in the home. Patient is unable to perform ADL's/IADL's independently and is unable to pay for in-home care services out-of-pocket. Level of care concerns and lacks  knowledge of available community agencies and resources. Clinical Goals:  Patient will work with LCSW and Community Care Guides to try and obtain all of the following services and resources: Adult Medicaid, [employer] (Supplemental Nutrition Assistance Program/Food Stamps), Life Alert, and Medicaid Transportation - [place] [employer] of Orthoptist; Building surveyor - [employer] of the [place]; Pre-Packed and [place] Program; Chiropractor - Architect and [place]; and [place]. Patient will demonstrate improved health management independence as evidenced by having supplemental insurance and prescription co-pay assistance, financial assistance with food and home meal delivery services, home medical alert device, transportation, and personal care services in place. Clinical Interventions: Patient interviewed and appropriate assessments performed. Collaboration with Primary Care Physician, Dr. [name] regarding development and update of comprehensive plan of care as evidenced by provider attestation and co-signature. Collaboration with Primary Care Physician, Dr. [name] to report that you  have completed your course of home health physical and occupational therapy services. Inter-disciplinary care team collaboration (see longitudinal plan of care). Interventions performed:  Problem Solving/Task-Centered Solutions developed and implemented, Psychoeducation/Health Education utilized, Quality of Sleep assessed and Sleep Hygiene Techniques promoted, Caregiver Stress acknowledged and Consideration of personal care services encouraged. Mailed patient the following list of instructions, applications, information and brochures:   Personal Care Services Instructions, Land and Agency Provider List - [place] Kohl's Tips and Application - [place] [employer] of Social Services [employer], List of Transportation Options, Designer, industrial/product, Equities trader ([facility] [place] [employer]), and [employer] Landscape architect [place], and [employer], [employer] (Supplemental Nutrition Assistance Program/Food Stamps)  [place], Haematologist ([facility] [place]), and [place], Media planner. Discussed plans with patient for ongoing care management follow-up and provided direct contact information for care management team. Assisted patient with obtaining information about health plan benefits through [facility], encouraging him to also consider applying for Adult Medicaid. Provided education to patient regarding level of care options, but he is adamant about remaining in his own home. Assessed needs, level of care concerns, basic eligibility and provided education and information on all available community agencies and resources (listed above). Identified resources and durable medical equipment needed in the home to improve safety and promote independence.  Verbalized having a wheelchair, walker, cane, shower chair with back, sliding/transfer board, reacher, grab bars in shower, grab bars around  toilet, 3-in-1/bedside commode.  Encouraged patient to consider purchasing a hoyer lift, as well as get fitted for a prosthetic leg.  Patient denied  Interest at this time. Patient Goals/Self-Care Activities:  Review resource instructions, applications, information and brochures, mailed to your home on 03/10/2021, contacting LCSW directly (# [phone]), if you have questions, need assistance with application completion and/or submission, or if  additional social work needs are identified between now and our next scheduled telephone outreach call. Accept all calls from [place], as well as LCSW, in an effort to establish services and obtain assistance with applying for community agencies and resources.     Follow-Up:  03/23/2021 at 9:30am          [place] Clinical Social Worker [place] [phone]

## 2021-03-10 NOTE — Patient Instructions (Signed)
Visit Information   PATIENT GOALS/PLAN OF CARE:  Care Plan : LCSW Plan of Care  Updates made by Francis Gaines, LCSW since 03/10/2021 12:00 AM     Problem: Find Help in My Community.   Priority: High     Goal: Find Help in My Community.   Start Date: 03/09/2021  Expected End Date: 05/09/2021  This Visit's Progress: On track  Priority: High  Note:   Current Barriers:   Patient with Type 2 Diabetes Mellitus with Diabetic Nephropathy, Hypertension, Coronary Artery Disease, Major Depression, Single Episode, Mild and Status-Post Unilateral Above Knee Amputee, needs Support, Education, Referrals, Resources, Advocacy and Care Coordination to resolve unmet personal care needs in the home. Patient is unable to perform ADL's/IADL's independently and is unable to pay for in-home care services out-of-pocket. Level of care concerns and lacks knowledge of available community agencies and resources. Clinical Goals:  Patient will work with LCSW and Community Care Guides to try and obtain all of the following services and resources: Adult Medicaid, [employer] (Supplemental Nutrition Assistance Program/Food Stamps), Life Alert, and Medicaid Transportation - [place] [employer] of Orthoptist; Building surveyor - [employer] of the [place]; Pre-Packed and [place] Program; Chiropractor - Architect and [place]; and [place]. Patient will demonstrate improved health management independence as evidenced by having supplemental insurance and prescription co-pay assistance, financial assistance with food and home meal delivery services, home medical alert device, transportation, and personal care services in place. Clinical Interventions: Patient interviewed and appropriate assessments performed. Collaboration with Primary Care  Physician, Dr. [name] regarding development and update of comprehensive plan of care as evidenced by provider attestation and co-signature. Collaboration with Primary Care Physician, Dr. [name] to report that you have completed your course of home health physical and occupational therapy services. Inter-disciplinary care team collaboration (see longitudinal plan of care). Interventions performed:  Problem Solving/Task-Centered Solutions developed and implemented, Psychoeducation/Health Education utilized, Quality of Sleep assessed and Sleep Hygiene Techniques promoted, Caregiver Stress acknowledged and Consideration of personal care services encouraged. Mailed patient the following list of instructions, applications, information and brochures:   Personal Care Services Instructions, Land and Agency Provider List - [place] [employer] Tips and Application - [place] [employer] of Social Services [employer], List of Transportation Options, Designer, industrial/product, Equities trader ([facility] [employer]), and [employer] Landscape architect List of [place], and [employer], [employer] (Supplemental Nutrition Assistance Program/Food Stamps)  Land, Haematologist ([facility] [place] [employer]), and [place], Media planner. Discussed plans with patient for ongoing care management follow-up and provided direct contact information for care management team. Assisted patient with obtaining information about health plan benefits through [facility], encouraging him to also consider applying for Adult Medicaid. Provided education to patient regarding level of care options, but he is adamant about remaining in his own home. Assessed needs,  level of care concerns, basic eligibility and provided education and information on all available community agencies and resources (listed above). Identified resources and durable medical equipment needed in the home to improve safety and promote independence.  Verbalized having a wheelchair, walker, cane, shower chair with back, sliding/transfer board, reacher, grab bars in shower, grab bars around  toilet, 3-in-1/bedside commode.  Encouraged patient to  consider purchasing a hoyer lift, as well as get fitted for a prosthetic leg.  Patient denied  Interest at this time. Patient Goals/Self-Care Activities:  Review resource instructions, applications, information and brochures, mailed to your home on 03/10/2021, contacting LCSW directly (# [phone]), if you have questions, need assistance with application completion and/or submission, or if additional social work needs are identified between now and our next scheduled telephone outreach call. Accept all calls from [place], as well as LCSW, in an effort to establish services and obtain assistance with applying for community agencies and resources.     Follow-Up:  03/23/2021 at 9:30am            Consent to CCM Services: [name] [name] was given information about Chronic Care Management services including:  CCM service includes personalized support from designated clinical staff supervised by his physician, including individualized plan of care and coordination with other care providers 24/7 contact phone numbers for assistance for urgent and routine care needs. Service will only be billed when office clinical staff spend 20 minutes or more in a month to coordinate care. Only one practitioner may furnish and bill the service in a calendar month. The patient may stop CCM services at any time (effective at the end of the month) by phone call to the office staff. The patient will be responsible for cost sharing (co-pay) of up to 20%  of the service fee (after annual deductible is met).  Patient agreed to services and verbal consent obtained.   Patient verbalizes understanding of instructions provided today and agrees to view in [place].   Telephone follow up appointment with care management team member scheduled for:  03/23/2021 at 9:30am  [place] Licensed Clinical Social Worker [place] [phone]

## 2021-03-11 DIAGNOSIS — R404 Transient alteration of awareness: Secondary | ICD-10-CM | POA: Diagnosis not present

## 2021-03-11 DIAGNOSIS — I469 Cardiac arrest, cause unspecified: Secondary | ICD-10-CM | POA: Diagnosis not present

## 2021-03-11 DIAGNOSIS — R4020 Unspecified coma: Secondary | ICD-10-CM | POA: Diagnosis not present

## 2021-03-11 DIAGNOSIS — I499 Cardiac arrhythmia, unspecified: Secondary | ICD-10-CM | POA: Diagnosis not present

## 2021-03-13 ENCOUNTER — Telehealth: Payer: Self-pay

## 2021-03-13 NOTE — Telephone Encounter (Signed)
Daughter of patient called to inform Dr. [name] patient passed away over the weekend

## 2021-03-14 ENCOUNTER — Telehealth: Payer: Self-pay

## 2021-03-14 NOTE — Telephone Encounter (Signed)
I spoke with funeral home, they will bring death certificate by.

## 2021-03-14 NOTE — Telephone Encounter (Signed)
[facility] called asking if Dr. [name] will sign death certificate

## 2021-03-14 NOTE — Telephone Encounter (Signed)
Called and spoke with Ms Driggers to given my condolences. Ms. Holberg found him unconscious around 11: 05 am on 04/05/2021,he was not breathing and has no pulse. She started CPR. EMS and fire department arrived and continue CPR, unsuccessfully. Death certificate completed and signed. Alaira Level [name], MD

## 2021-03-21 ENCOUNTER — Encounter (HOSPITAL_BASED_OUTPATIENT_CLINIC_OR_DEPARTMENT_OTHER): Payer: Medicare HMO | Admitting: Internal Medicine

## 2021-03-23 ENCOUNTER — Telehealth: Payer: Medicare HMO

## 2021-03-23 DIAGNOSIS — 419620001 Death: Secondary | SNOMED CT | POA: Diagnosis not present

## 2021-03-23 DEATH — deceased
